# Patient Record
Sex: Female | Born: 1935
Health system: Southern US, Community
[De-identification: ages and names within clinical notes are randomized; demographics above are authoritative.]

## PROBLEM LIST (undated history)

## (undated) DIAGNOSIS — Z87442 Personal history of urinary calculi: Secondary | ICD-10-CM

## (undated) DIAGNOSIS — H3530 Unspecified macular degeneration: Secondary | ICD-10-CM

## (undated) DIAGNOSIS — E039 Hypothyroidism, unspecified: Secondary | ICD-10-CM

## (undated) DIAGNOSIS — E785 Hyperlipidemia, unspecified: Secondary | ICD-10-CM

## (undated) DIAGNOSIS — K219 Gastro-esophageal reflux disease without esophagitis: Secondary | ICD-10-CM

## (undated) DIAGNOSIS — I10 Essential (primary) hypertension: Secondary | ICD-10-CM

## (undated) DIAGNOSIS — Z952 Presence of prosthetic heart valve: Secondary | ICD-10-CM

## (undated) DIAGNOSIS — I350 Nonrheumatic aortic (valve) stenosis: Secondary | ICD-10-CM

## (undated) DIAGNOSIS — H9319 Tinnitus, unspecified ear: Secondary | ICD-10-CM

## (undated) DIAGNOSIS — H269 Unspecified cataract: Secondary | ICD-10-CM

## (undated) DIAGNOSIS — I4891 Unspecified atrial fibrillation: Secondary | ICD-10-CM

## (undated) DIAGNOSIS — C50919 Malignant neoplasm of unspecified site of unspecified female breast: Secondary | ICD-10-CM

## (undated) DIAGNOSIS — M1990 Unspecified osteoarthritis, unspecified site: Secondary | ICD-10-CM

## (undated) DIAGNOSIS — N393 Stress incontinence (female) (male): Secondary | ICD-10-CM

## (undated) DIAGNOSIS — E079 Disorder of thyroid, unspecified: Secondary | ICD-10-CM

## (undated) HISTORY — DX: Unspecified osteoarthritis, unspecified site: M19.90

## (undated) HISTORY — DX: Unspecified cataract: H26.9

## (undated) HISTORY — PX: ABDOMINAL HYSTERECTOMY: SHX81

## (undated) HISTORY — DX: Gastro-esophageal reflux disease without esophagitis: K21.9

## (undated) HISTORY — DX: Malignant neoplasm of unspecified site of unspecified female breast: C50.919

## (undated) HISTORY — DX: Hyperlipidemia, unspecified: E78.5

## (undated) HISTORY — PX: BACK SURGERY: SHX140

## (undated) HISTORY — DX: Disorder of thyroid, unspecified: E07.9

## (undated) HISTORY — PX: TONSILLECTOMY: SUR1361

## (undated) HISTORY — DX: Essential (primary) hypertension: I10

## (undated) HISTORY — PX: LITHOTRIPSY: SUR834

## (undated) HISTORY — PX: EYE SURGERY: SHX253

## (undated) HISTORY — PX: OTHER SURGICAL HISTORY: SHX169

## (undated) HISTORY — PX: CARDIAC CATHETERIZATION: SHX172

---

## 1973-10-23 HISTORY — PX: THYROIDECTOMY, PARTIAL: SHX18

## 1998-12-13 ENCOUNTER — Ambulatory Visit (HOSPITAL_COMMUNITY): Admission: RE | Admit: 1998-12-13 | Discharge: 1998-12-13 | Payer: Self-pay | Admitting: Obstetrics and Gynecology

## 1998-12-13 ENCOUNTER — Encounter: Payer: Self-pay | Admitting: Obstetrics and Gynecology

## 1998-12-16 ENCOUNTER — Ambulatory Visit (HOSPITAL_COMMUNITY): Admission: RE | Admit: 1998-12-16 | Discharge: 1998-12-16 | Payer: Self-pay | Admitting: Obstetrics and Gynecology

## 1998-12-16 ENCOUNTER — Encounter: Payer: Self-pay | Admitting: Obstetrics and Gynecology

## 1998-12-22 HISTORY — PX: BREAST LUMPECTOMY: SHX2

## 1999-01-03 ENCOUNTER — Other Ambulatory Visit: Admission: RE | Admit: 1999-01-03 | Discharge: 1999-01-03 | Payer: Self-pay | Admitting: *Deleted

## 1999-01-03 ENCOUNTER — Ambulatory Visit (HOSPITAL_COMMUNITY): Admission: RE | Admit: 1999-01-03 | Discharge: 1999-01-03 | Payer: Self-pay | Admitting: *Deleted

## 1999-01-03 ENCOUNTER — Encounter: Payer: Self-pay | Admitting: *Deleted

## 1999-01-12 ENCOUNTER — Ambulatory Visit (HOSPITAL_BASED_OUTPATIENT_CLINIC_OR_DEPARTMENT_OTHER): Admission: RE | Admit: 1999-01-12 | Discharge: 1999-01-12 | Payer: Self-pay | Admitting: *Deleted

## 1999-01-12 ENCOUNTER — Encounter: Payer: Self-pay | Admitting: *Deleted

## 1999-02-09 ENCOUNTER — Encounter: Admission: RE | Admit: 1999-02-09 | Discharge: 1999-05-10 | Payer: Self-pay | Admitting: Radiation Oncology

## 1999-05-19 ENCOUNTER — Other Ambulatory Visit: Admission: RE | Admit: 1999-05-19 | Discharge: 1999-05-19 | Payer: Self-pay | Admitting: Obstetrics and Gynecology

## 1999-06-08 ENCOUNTER — Ambulatory Visit (HOSPITAL_COMMUNITY): Admission: RE | Admit: 1999-06-08 | Discharge: 1999-06-08 | Payer: Self-pay | Admitting: Internal Medicine

## 1999-06-08 ENCOUNTER — Encounter: Payer: Self-pay | Admitting: Internal Medicine

## 1999-06-20 ENCOUNTER — Ambulatory Visit (HOSPITAL_COMMUNITY): Admission: RE | Admit: 1999-06-20 | Discharge: 1999-06-20 | Payer: Self-pay | Admitting: *Deleted

## 1999-07-29 ENCOUNTER — Ambulatory Visit (HOSPITAL_COMMUNITY): Admission: RE | Admit: 1999-07-29 | Discharge: 1999-07-29 | Payer: Self-pay | Admitting: *Deleted

## 1999-07-29 ENCOUNTER — Encounter: Payer: Self-pay | Admitting: *Deleted

## 1999-12-21 ENCOUNTER — Encounter: Payer: Self-pay | Admitting: *Deleted

## 1999-12-21 ENCOUNTER — Ambulatory Visit (HOSPITAL_COMMUNITY): Admission: RE | Admit: 1999-12-21 | Discharge: 1999-12-21 | Payer: Self-pay | Admitting: *Deleted

## 2000-02-04 ENCOUNTER — Encounter: Payer: Self-pay | Admitting: Emergency Medicine

## 2000-02-04 ENCOUNTER — Emergency Department (HOSPITAL_COMMUNITY): Admission: EM | Admit: 2000-02-04 | Discharge: 2000-02-04 | Payer: Self-pay | Admitting: Emergency Medicine

## 2000-05-25 ENCOUNTER — Other Ambulatory Visit: Admission: RE | Admit: 2000-05-25 | Discharge: 2000-05-25 | Payer: Self-pay | Admitting: Obstetrics and Gynecology

## 2000-06-22 ENCOUNTER — Encounter: Payer: Self-pay | Admitting: Internal Medicine

## 2000-06-22 ENCOUNTER — Encounter: Admission: RE | Admit: 2000-06-22 | Discharge: 2000-06-22 | Payer: Self-pay | Admitting: Internal Medicine

## 2001-01-01 ENCOUNTER — Ambulatory Visit (HOSPITAL_COMMUNITY): Admission: RE | Admit: 2001-01-01 | Discharge: 2001-01-01 | Payer: Self-pay | Admitting: *Deleted

## 2001-01-01 ENCOUNTER — Encounter: Payer: Self-pay | Admitting: *Deleted

## 2001-01-02 ENCOUNTER — Encounter: Admission: RE | Admit: 2001-01-02 | Discharge: 2001-01-02 | Payer: Self-pay | Admitting: *Deleted

## 2001-01-02 ENCOUNTER — Encounter: Payer: Self-pay | Admitting: *Deleted

## 2001-08-22 ENCOUNTER — Other Ambulatory Visit: Admission: RE | Admit: 2001-08-22 | Discharge: 2001-08-22 | Payer: Self-pay | Admitting: Obstetrics and Gynecology

## 2001-12-17 ENCOUNTER — Encounter: Payer: Self-pay | Admitting: Internal Medicine

## 2001-12-17 ENCOUNTER — Encounter: Admission: RE | Admit: 2001-12-17 | Discharge: 2001-12-17 | Payer: Self-pay | Admitting: Internal Medicine

## 2002-03-20 ENCOUNTER — Other Ambulatory Visit: Admission: RE | Admit: 2002-03-20 | Discharge: 2002-03-20 | Payer: Self-pay | Admitting: Oncology

## 2002-12-26 ENCOUNTER — Encounter: Admission: RE | Admit: 2002-12-26 | Discharge: 2002-12-26 | Payer: Self-pay | Admitting: Internal Medicine

## 2002-12-26 ENCOUNTER — Encounter: Payer: Self-pay | Admitting: Internal Medicine

## 2004-02-05 ENCOUNTER — Encounter: Admission: RE | Admit: 2004-02-05 | Discharge: 2004-02-05 | Payer: Self-pay | Admitting: Internal Medicine

## 2005-02-13 ENCOUNTER — Encounter: Admission: RE | Admit: 2005-02-13 | Discharge: 2005-02-13 | Payer: Self-pay | Admitting: Internal Medicine

## 2005-07-10 ENCOUNTER — Ambulatory Visit (HOSPITAL_COMMUNITY): Admission: RE | Admit: 2005-07-10 | Discharge: 2005-07-10 | Payer: Self-pay | Admitting: *Deleted

## 2005-07-10 ENCOUNTER — Encounter (INDEPENDENT_AMBULATORY_CARE_PROVIDER_SITE_OTHER): Payer: Self-pay | Admitting: Specialist

## 2006-02-16 ENCOUNTER — Encounter: Admission: RE | Admit: 2006-02-16 | Discharge: 2006-02-16 | Payer: Self-pay | Admitting: Surgery

## 2006-07-31 ENCOUNTER — Encounter: Admission: RE | Admit: 2006-07-31 | Discharge: 2006-07-31 | Payer: Self-pay | Admitting: Internal Medicine

## 2006-08-07 ENCOUNTER — Encounter: Admission: RE | Admit: 2006-08-07 | Discharge: 2006-08-07 | Payer: Self-pay | Admitting: Internal Medicine

## 2006-08-08 ENCOUNTER — Encounter: Admission: RE | Admit: 2006-08-08 | Discharge: 2006-08-08 | Payer: Self-pay | Admitting: Internal Medicine

## 2007-03-07 ENCOUNTER — Encounter: Admission: RE | Admit: 2007-03-07 | Discharge: 2007-03-07 | Payer: Self-pay | Admitting: Internal Medicine

## 2008-03-20 ENCOUNTER — Encounter: Admission: RE | Admit: 2008-03-20 | Discharge: 2008-03-20 | Payer: Self-pay | Admitting: Internal Medicine

## 2009-03-29 ENCOUNTER — Encounter: Admission: RE | Admit: 2009-03-29 | Discharge: 2009-03-29 | Payer: Self-pay | Admitting: Internal Medicine

## 2010-07-15 ENCOUNTER — Encounter: Admission: RE | Admit: 2010-07-15 | Discharge: 2010-07-15 | Payer: Self-pay | Admitting: Surgery

## 2010-07-22 ENCOUNTER — Encounter: Admission: RE | Admit: 2010-07-22 | Discharge: 2010-07-22 | Payer: Self-pay | Admitting: Surgery

## 2010-11-13 ENCOUNTER — Encounter: Payer: Self-pay | Admitting: Surgery

## 2011-03-10 NOTE — Op Note (Signed)
NAMELORRIN, BODNER           ACCOUNT NO.:  [id number]   MEDICAL RECORD NO.:  [id number]          PATIENT TYPE:  AMB   LOCATION:  ENDO                         FACILITY:  [facility]   PHYSICIAN:  Georgiana Spinner, M.D.    DATE OF BIRTH:  [birth year]   DATE OF PROCEDURE:  07/10/2005  DATE OF DISCHARGE:                                 OPERATIVE REPORT   PROCEDURE:  Upper endoscopy with biopsy.   INDICATIONS:  GERD.   ANESTHESIA:  Demerol 50, Versed 5 mg.   PROCEDURE:  With patient mildly sedated in the left lateral decubitus  position, the Olympus videoscopic endoscope was inserted in the mouth,  passed under direct vision through the esophagus which appeared normal until  we reached the distal esophagus, and there was a question of Barrett's,  photographed and biopsied.  We entered into the stomach.  Fundus, body,  antrum, duodenal bulb, second portion of duodenum all visualized.  From this  point, the endoscope was slowly withdrawn taking circumferential views of  duodenal mucosa until the endoscope had been pulled back into the stomach,  placed in retroflexion to view the stomach from below.  The endoscope was  then straightened and withdrawn taking circumferential views of remaining  gastric and esophageal mucosa.  The patient's vital signs and pulse oximeter  remained stable.  The patient tolerated the procedure well without apparent  complications.   FINDINGS:  Question of Barrett's esophagus, biopsied.  Await biopsy report.  The patient will call me for results and follow-up with me as an outpatient.  Proceed to colonoscopy as planned.           ______________________________  Georgiana Spinner, M.D.     GMO/MEDQ  D:  07/10/2005  T:  07/10/2005  Job:  [id number]

## 2011-03-10 NOTE — Op Note (Signed)
[name], [name]           ACCOUNT NO.:  [id number]   MEDICAL RECORD NO.:  [id number]          PATIENT TYPE:  AMB   LOCATION:  ENDO                         FACILITY:  [facility]   PHYSICIAN:  Georgiana Spinner, M.D.    DATE OF BIRTH:  05/22/1934   DATE OF PROCEDURE:  07/10/2005  DATE OF DISCHARGE:                                 OPERATIVE REPORT   PROCEDURE:  Colonoscopy.   INDICATIONS:  Colon polyps.   ANESTHESIA:  Demerol 20, Versed 2 milligrams.   DESCRIPTION OF PROCEDURE:  With the patient mildly sedated in the left  lateral decubitus position, the Olympus videoscopic colonoscope was inserted  in the rectum, passed under direct vision to the cecum, identified by the  ileocecal valve and appendiceal orifice, both of which were photographed.  From this point, the colonoscope was slowly withdrawn taking circumferential  views of the colonic mucosa stopping only in the rectum which appeared  normal on direct and retroflexed view. The endoscope was straightened and  withdrawn. The patient's vital signs and pulse oximeter remained stable. The  patient tolerated the procedure well without apparent complications.   FINDINGS:  Unremarkable examination.   PLAN:  See endoscopy note.           ______________________________  Georgiana Spinner, M.D.     GMO/MEDQ  D:  07/10/2005  T:  07/10/2005  Job:  [id number]

## 2011-03-16 ENCOUNTER — Encounter (INDEPENDENT_AMBULATORY_CARE_PROVIDER_SITE_OTHER): Payer: Self-pay | Admitting: Surgery

## 2011-03-24 ENCOUNTER — Ambulatory Visit (HOSPITAL_COMMUNITY)
Admission: RE | Admit: 2011-03-24 | Discharge: 2011-03-24 | Disposition: A | Discharge status: Home / Self Care | Payer: Medicare Other | Source: Ambulatory Visit | Attending: Cardiology | Admitting: Cardiology

## 2011-03-24 DIAGNOSIS — E039 Hypothyroidism, unspecified: Secondary | ICD-10-CM | POA: Insufficient documentation

## 2011-03-24 DIAGNOSIS — K219 Gastro-esophageal reflux disease without esophagitis: Secondary | ICD-10-CM | POA: Insufficient documentation

## 2011-03-24 DIAGNOSIS — E785 Hyperlipidemia, unspecified: Secondary | ICD-10-CM | POA: Insufficient documentation

## 2011-03-24 DIAGNOSIS — I10 Essential (primary) hypertension: Secondary | ICD-10-CM | POA: Insufficient documentation

## 2011-03-24 DIAGNOSIS — M19049 Primary osteoarthritis, unspecified hand: Secondary | ICD-10-CM | POA: Insufficient documentation

## 2011-03-24 DIAGNOSIS — Z853 Personal history of malignant neoplasm of breast: Secondary | ICD-10-CM | POA: Insufficient documentation

## 2011-03-24 DIAGNOSIS — R0609 Other forms of dyspnea: Secondary | ICD-10-CM | POA: Insufficient documentation

## 2011-03-24 DIAGNOSIS — R0989 Other specified symptoms and signs involving the circulatory and respiratory systems: Secondary | ICD-10-CM | POA: Insufficient documentation

## 2011-03-24 DIAGNOSIS — E669 Obesity, unspecified: Secondary | ICD-10-CM | POA: Insufficient documentation

## 2011-03-24 DIAGNOSIS — I2510 Atherosclerotic heart disease of native coronary artery without angina pectoris: Secondary | ICD-10-CM | POA: Insufficient documentation

## 2011-04-02 ENCOUNTER — Ambulatory Visit (HOSPITAL_COMMUNITY)
Admission: EM | Admit: 2011-04-02 | Discharge: 2011-04-03 | Disposition: A | Discharge status: Home / Self Care | Payer: Medicare Other | Attending: Cardiology | Admitting: Cardiology

## 2011-04-02 ENCOUNTER — Inpatient Hospital Stay (INDEPENDENT_AMBULATORY_CARE_PROVIDER_SITE_OTHER)
Admission: RE | Admit: 2011-04-02 | Discharge: 2011-04-02 | Disposition: A | Discharge status: Home / Self Care | Payer: Medicare Other | Source: Ambulatory Visit | Attending: Emergency Medicine | Admitting: Emergency Medicine

## 2011-04-02 ENCOUNTER — Emergency Department (HOSPITAL_COMMUNITY): Payer: Medicare Other

## 2011-04-02 DIAGNOSIS — R0989 Other specified symptoms and signs involving the circulatory and respiratory systems: Secondary | ICD-10-CM | POA: Insufficient documentation

## 2011-04-02 DIAGNOSIS — R079 Chest pain, unspecified: Secondary | ICD-10-CM

## 2011-04-02 DIAGNOSIS — Z01812 Encounter for preprocedural laboratory examination: Secondary | ICD-10-CM | POA: Insufficient documentation

## 2011-04-02 DIAGNOSIS — R0609 Other forms of dyspnea: Secondary | ICD-10-CM | POA: Insufficient documentation

## 2011-04-02 DIAGNOSIS — E785 Hyperlipidemia, unspecified: Secondary | ICD-10-CM | POA: Insufficient documentation

## 2011-04-02 DIAGNOSIS — I10 Essential (primary) hypertension: Secondary | ICD-10-CM | POA: Insufficient documentation

## 2011-04-02 DIAGNOSIS — I2510 Atherosclerotic heart disease of native coronary artery without angina pectoris: Secondary | ICD-10-CM | POA: Insufficient documentation

## 2011-04-02 DIAGNOSIS — R0602 Shortness of breath: Secondary | ICD-10-CM | POA: Insufficient documentation

## 2011-04-02 DIAGNOSIS — Z01810 Encounter for preprocedural cardiovascular examination: Secondary | ICD-10-CM | POA: Insufficient documentation

## 2011-04-02 DIAGNOSIS — M79609 Pain in unspecified limb: Secondary | ICD-10-CM

## 2011-04-02 DIAGNOSIS — R0789 Other chest pain: Secondary | ICD-10-CM | POA: Insufficient documentation

## 2011-04-02 DIAGNOSIS — Z01818 Encounter for other preprocedural examination: Secondary | ICD-10-CM | POA: Insufficient documentation

## 2011-04-02 LAB — CBC
Platelets: 191 10*3/uL (ref 150–400)
RDW: 12.7 % (ref 11.5–15.5)
WBC: 8.5 10*3/uL (ref 4.0–10.5)

## 2011-04-02 LAB — D-DIMER, QUANTITATIVE: D-Dimer, Quant: 0.35 ug/mL-FEU (ref 0.00–0.48)

## 2011-04-02 LAB — TROPONIN I: Troponin I: <0.3 ng/mL (ref <0.30)

## 2011-04-02 LAB — POCT I-STAT, CHEM 8
Calcium, Ion: 0.99 mmol/L — ABNORMAL LOW (ref 1.12–1.32)
HCT: 37 % (ref 36.0–46.0)
TCO2: 27 mmol/L (ref 0–100)

## 2011-04-03 LAB — CBC
MCH: 29.6 pg (ref 26.0–34.0)
MCV: 87.3 fL (ref 78.0–100.0)
Platelets: 166 10*3/uL (ref 150–400)
RDW: 12.8 % (ref 11.5–15.5)

## 2011-04-03 LAB — CK TOTAL AND CKMB (NOT AT ARMC): Total CK: 34 U/L (ref 7–177)

## 2011-04-18 NOTE — Cardiovascular Report (Signed)
NAMELUISE, YAMAMOTO           ACCOUNT NO.:  [id number]  MEDICAL RECORD NO.:  [id number]           PATIENT TYPE:  O  LOCATION:  6522                         FACILITY:  [facility]  PHYSICIAN:  Pamella Pert, MD DATE OF BIRTH:  [birth year]  DATE OF PROCEDURE:  03/24/2011 DATE OF DISCHARGE:                           CARDIAC CATHETERIZATION   PROCEDURES PERFORMED: 1. Left ventriculography. 2. Selective right and left coronary arteriography.  INDICATIONS:  [name] is a 77 year old female, who presents with chest pain.  She had undergone treadmill exercise stress test which had revealed ST-segment depression which persisted for greater than 4 minutes into recovery.  Because of abnormal stress test and chest pain which is atypical and shortness of breath on exertion and hypertension and hyperlipidemia, she was brought to the cardiac catheterization lab to evaluate her coronary anatomy.  HEMODYNAMIC DATA:  The left ventricular pressure was 122/7 with an end- diastolic pressure of 16 mmHg.  Aortic pressure was 107/65 with a mean of 85 mmHg.  There was no pressure gradient across the aortic valve.  ANGIOGRAPHIC DATA:  Left ventricle:  Left ventricular systolic function was supranormal with an ejection fraction of 65%-70%.  There was no regional wall motion abnormality, no significant mitral regurgitation.  Right coronary artery:  Right coronary artery is a large-caliber vessel. It is a dominant vessel.  Smooth and normal.  Left main coronary artery:  Left main coronary artery is a large-caliber vessel.  Smooth and normal.  Circumflex coronary artery:  Circumflex coronary artery is a large- caliber vessel.  It gives origin to small obtuse marginal 1, the larger obtuse marginal 2.  It is smooth and normal.  Ramus intermediate:  Ramus intermediate is a very large-caliber vessel. Ostium of the ramus intermediate has a 30% focal stenosis.  It is mildly hazy.  LAD:   LAD is a very large-caliber vessel giving origin to large diagonal 1 and a moderate-sized diagonal 2.  It is smooth and normal.  IMPRESSION:  Single-vessel coronary artery disease involving the ostium of the ramus intermediate constituting about 70% stenoses.  RECOMMENDATIONS:  I would recommend medical therapy only.  It is very close to the left main circumflex LAD bifurcation and ostial involvement.  There is slightly higher risk for potential complications during angioplasty.  Unless she has worsening angina or lifestyle- limiting angina pectoris in spite of aggressive medical therapy, then only we will consider angioplasty.  A total of 60 mL of contrast was utilized for diagnostic angiography.  TECHNIQUE OF PROCEDURE:  Under sterile precautions using a 6-French right radial access, 6-French TIG #4 catheter was advanced into the ascending aorta, then into the left ventricle.  Left ventriculography was performed in the RAO projection.  Catheter pulled into the ascending aorta.  Left main coronary artery selectively engaged and angiography was performed.  Then, the right coronary artery selectively engaged and angiography was performed.  Catheter then pulled out of the body over an exchange length J-wire.  The patient tolerated the procedure well.  No immediate complications noted.  Hemostasis was obtained by applying TR band.     Pamella Pert, MD  JRG/MEDQ  D:  03/24/2011  T:  03/24/2011  Job:  [id number]  cc:   Massie Maroon, MD  Electronically Signed by Yates Decamp MD on 04/18/2011 10:19:01 AM

## 2011-04-18 NOTE — Cardiovascular Report (Signed)
Zoe [name], Zoe [name]           ACCOUNT NO.:  [id number]  MEDICAL RECORD NO.:  [id number]  LOCATION:  2011                         FACILITY:  [facility]  PHYSICIAN:  Pamella Pert, MD DATE OF BIRTH:  1934-02-09  DATE OF PROCEDURE:  04/03/2011 DATE OF DISCHARGE:  04/03/2011                           CARDIAC CATHETERIZATION   PROCEDURE PERFORMED: 1. Coronary arteriography. 2. Intravascular ultrasound interrogation of the ramus intermediate     branch of the circumflex coronary artery.  INDICATIONS:  [name] is a 77 year old female with history of hypertension, hyperlipidemia who had undergone cardiac catheterization on March 24, 2011 and was found to have a ramus intermediate stenoses of 70%.  It was felt that medical therapy was indicated.  Otherwise she had normal coronary arteries.  However, she was admitted through the emergency department with symptoms suggestive of unstable angina.  She is ruled out for myocardial infarction and brought to the cardiac cath lab to reevaluate her coronary anatomy with a intention of proceeding with further evaluation of the ramus intermedius stenoses either by FFR or intravascular ultrasound.  After obtaining informed consent, she was brought to the cardiac catheterization lab to evaluate the same.  Right coronary artery.  Right coronary artery is a very large caliber vessel and a dominant vessel giving origin to large PDA and large PL branch.  It is smooth and normal.  Left main coronary artery.  Left main coronary artery is large caliber vessel.  Smooth and normal.  Circumflex.  Circumflex coronary artery is a large-caliber vessel giving origin to large obtuse marginal 1.  Smooth and normal.  Ramus intermediate.  Ramus intermediate is a large-caliber vessel which is smooth except the ostium has a 60-70% stenoses.  LAD.  LAD is a large caliber vessel giving origin to large diagonal 1 and diagonal 2.  Smooth and  normal.  IVUS DATA:  Intravascular ultrasound interrogation of the ramus intermedius branch revealed the ramus intermediate branch to have absolutely no atherosclerotic changes throughout the entire lumen.  At the ostium there was intramyocardial bridging.  The lumen area went down from 5.3-mm to 3.59-mm during systole.  However, there are no orthostatic changes, still a good lumen was present.  RECOMMENDATIONS:  Based on the angiographic data, evaluation for noncardiac cause of chest pain is indicated.  Although she has ramus intermediate intramyocardial bridging, this should not give her rest pain and also the residual lumen area is fairly large.  Hence, I suspect either GI etiology for her chest pain.  We will be discharging the patient home today with outpatient consultation.  She would like to see Dr. Vida Rigger.  I will try to arrange the same.  I will also inform Dr. Pearson Grippe regarding her situation.  She will be discharged home on no cardiac medications but continued primary prevention.  TECHNIQUE OF THE PROCEDURE:  Under sterile precautions using a 6-French right femoral arterial access, a 6-French JR-4 diagnostic catheter was utilized to engage the right coronary artery and angiography was performed.  The catheter was then pulled out of body over a J-wire.  A XB 3.5 guide catheter was utilized to engage the left main coronary artery.  Using Angiomax for anticoagulation and  Cougar guidewire, I was able to cross through the ramus intermedius branch.  Using Galaxy IVUS catheter, careful pullback was performed.  IVUS data was carefully analyzed.  Manual pullback was also again performed at the site of the stenosis.  The catheter and wire were withdrawn out of the body.  Prior to IVUS, 200 mcg of intracoronary nitroglycerin was administered.  The patient tolerated the procedure well.  Right femoral arteriography was performed through the arterial access sheath and access was  closed with Perclose with excellent hemostasis. The patient tolerated the procedure. well.  No immediate complications.     Pamella Pert, MD     JRG/MEDQ  D:  04/03/2011  T:  04/04/2011  Job:  [id number]  cc:   Massie Maroon, MD Petra Kuba, M.D.  Electronically Signed by Yates Decamp MD on 04/18/2011 10:19:14 AM

## 2011-04-18 NOTE — H&P (Signed)
NAMEEMALEY, APPLIN           ACCOUNT NO.:  [id number]  MEDICAL RECORD NO.:  [id number]  LOCATION:  2011                         FACILITY:  [facility]  PHYSICIAN:  Pamella Pert, MD DATE OF BIRTH:  [birth year]  DATE OF ADMISSION:  04/02/2011 DATE OF DISCHARGE:  04/03/2011                             HISTORY & PHYSICAL   ADMISSION DIAGNOSES: 1. Unstable angina. 2. Coronary artery disease with known ramus intermediate 70% stenosis     by recent cardiac catheterization. 3. Hypertension. 4. Hyperlipidemia. 5. Shortness of breath and dyspnea on exertion.  HISTORY:  Ms. Stauder is a pleasant fairly active 77 year old female who had recent cardiac catheterization on March 24, 2011.  She was found to have essentially normal coronary arteries except for ramus intermediate which had like 60-70% stenosis, in some views appeared to be much type but, however, it was felt that she could be medically treated.  She had been doing well until Saturday night.  She woke up in the middle of night feeling heaviness in the middle of her chest, took 2 sublingual nitroglycerin with partial relief of chest discomfort and went back to sleep.  All day on Sunday she continued to have mild chest discomfort which she states is very mild.  No radiation of chest pain, no associated nausea, vomiting, or diaphoresis.  She was seen at the Urgent Care mostly for leg pain which she states has been hurting in her legs, both legs, that started about a couple of days ago.  It was just heaviness in her legs and she wanted to be ruled out for any kind of DVTs and the Urgent Care because of chest discomfort she was advised to go to the emergency department.  Point-of-care markers and physical examination and EKG was unremarkable in the emergency department but because of chest discomfort which was persistent.  She was admitted to the hospital as unstable angina.  Presently, this morning, she remains stable and  denies any chest pain, shortness of breath, paroxysmal nocturnal dyspnea, and orthopnea.  She also states that her legs are not hurting anymore.  PAST MEDICAL HISTORY: 1. Hypertension. 2. Hyperlipidemia. 3. Diabetes.  SOCIAL HISTORY:  She is married, lives with her husband, does not drink alcohol, and does not smoke tobacco products.  She is fairly active.  FAMILY HISTORY:  There is no history of premature coronary artery disease in her family.  HOME MEDICATIONS: 1. Imdur 60 mg p.o. daily. 2. Synthroid 100 mcg p.o. daily. 3. Singulair 10 mg p.o. daily. 4. Pravachol 20 mg p.o. at bedtime. 5. Omeprazole 20 mg p.o. b.i.d. 6. Nasonex spray daily. 7. Hydrochlorothiazide 25 mg q.a.m. 8. Coreg 3.125 mg p.o. b.i.d. 9. Aspirin 81 mg daily. 10.Amlodipine 5 mg p.o. daily. 11.Allegra 180 mg p.o. daily.  ALLERGIES:  No known drug allergies.  REVIEW OF SYSTEMS:  She denies any bowel or bladder disturbance.  Denies any neurological weakness.  Denies recent weight changes.  No TIA.  No lower extremity edema.  No acute tenderness in her legs.  No trauma to her legs.  Other systems were negative.  She is not a diabetic.  PHYSICAL EXAMINATION:  GENERAL:  She is moderately built and overweight. She appears to  be in no acute distress. VITAL SIGNS:  Temperature of 98.3, pulse is 78, respirations 14, and blood pressure 115/63 mmHg. CARDIAC:  S1-S2 is normal.  No gallop or murmur. CHEST:  Clear. ABDOMEN:  Soft. EXTREMITIES:  No edema.  Full range of movements.  She has mild bruising in her right wrist from prior radial access cardiac catheterization. NEUROLOGIC:  She is intact without any deficits.  IMAGING:  Her EKG demonstrated sinus rhythm with occasional PVCs.  Her D-dimer has been negative.  Her CBC showed mild anemia which is stable and BMP was within normal limits.  CPK and troponin x1 is negative for myocardial injury.  IMPRESSION: 1. Chest pain suspicious for unstable angina.   The patient had to take     several sublingual nitroglycerin over the weekend. 2. Coronary artery disease with ramus intermedius 70% stenosis,     otherwise smooth and normal coronaries by cardiac catheterization     done on March 24, 2011. 3. Hypertension.  RECOMMENDATIONS:  I discussed with the patient regarding proceeding with repeat cardiac catheterization with probable IVUS guided intervention. She understands less than a percent risk of death, stroke, heart attack and urgent need for bypass surgery but not limited to these.  All questions were answered.  She will be taken to the cardiac catheterization lab this morning.     Pamella Pert, MD     JRG/MEDQ  D:  04/03/2011  T:  04/04/2011  Job:  [id number]  cc:   Massie Maroon, MD  Electronically Signed by Yates Decamp MD on 04/18/2011 10:19:10 AM

## 2011-05-16 ENCOUNTER — Other Ambulatory Visit: Payer: Self-pay | Admitting: Gastroenterology

## 2011-05-17 ENCOUNTER — Ambulatory Visit
Admission: RE | Admit: 2011-05-17 | Discharge: 2011-05-17 | Disposition: A | Discharge status: Home / Self Care | Payer: Medicare Other | Source: Ambulatory Visit | Attending: Gastroenterology | Admitting: Gastroenterology

## 2011-08-09 ENCOUNTER — Other Ambulatory Visit (INDEPENDENT_AMBULATORY_CARE_PROVIDER_SITE_OTHER): Payer: Self-pay | Admitting: Surgery

## 2011-08-09 DIAGNOSIS — Z1231 Encounter for screening mammogram for malignant neoplasm of breast: Secondary | ICD-10-CM

## 2011-09-01 ENCOUNTER — Ambulatory Visit
Admission: RE | Admit: 2011-09-01 | Discharge: 2011-09-01 | Disposition: A | Discharge status: Home / Self Care | Payer: Medicare Other | Source: Ambulatory Visit | Attending: Surgery | Admitting: Surgery

## 2011-09-01 DIAGNOSIS — Z1231 Encounter for screening mammogram for malignant neoplasm of breast: Secondary | ICD-10-CM

## 2011-12-13 ENCOUNTER — Encounter (INDEPENDENT_AMBULATORY_CARE_PROVIDER_SITE_OTHER): Payer: Self-pay | Admitting: Surgery

## 2011-12-13 ENCOUNTER — Ambulatory Visit (INDEPENDENT_AMBULATORY_CARE_PROVIDER_SITE_OTHER): Payer: Medicare Other | Admitting: Surgery

## 2011-12-13 VITALS — BP 144/76 | HR 70 | Temp 97.8°F | Resp 18 | Ht 64.0 in | Wt 183.2 lb

## 2011-12-13 DIAGNOSIS — Z853 Personal history of malignant neoplasm of breast: Secondary | ICD-10-CM | POA: Insufficient documentation

## 2011-12-13 NOTE — Progress Notes (Signed)
[facility]  Ovidio Kin, MD,  FACS [address],  [address] [place], [place]    [address] Phone:  [phone] FAX:  [phone]   Re:   Zoe [name] DOB:   07/22/1934 MRN:   [id number]  ASSESSMENT AND PLAN: 1.  Left breast cancer, DCIS  Left lumpectomy - March 2000 by Dr. Elvina Mattes  Saw Dr. Darnelle Catalan post op.  Disease free.  Follow up in one year.  2  History of thyroid surgery for benign disease.  Thyroid function followed by Dr. Selena Batten. 3.  History of HTN. 4.  Following HgbA1c - on no meds.  HISTORY OF PRESENT ILLNESS: Chief Complaint  Patient presents with  . Breast Cancer Long Term Follow Up    Zoe [name] is a 78 y.o. (DOB: 01/29/1934)  white female who is a patient of Pearson Grippe, MD, MD and comes to me today for follow up for left breast cancer.  She is doing well with no new mass or lesion.  Her biggest concern is her blood surgars which have been high.  PHYSICAL EXAM: BP 144/76  Pulse 70  Temp(Src) 97.8 F (36.6 C) (Temporal)  Resp 18  Ht 5\' 4"  (1.626 m)  Wt 183 lb 3.2 oz (83.099 kg)  BMI 31.45 kg/m2  HEENT:  Pupils equal.  Dentition good.  No injury. NECK:  Supple.  No thyroid mass. LYMPH NODES:  No cervical, supraclavicular, or axillary adenopathy. BREASTS -  RIGHT:  No palpable mass or nodule.  No nipple discharge.   LEFT:  No palpable mass or nodule.  No nipple discharge.  Scar at 2 o'clock which is well healed. UPPER EXTREMITIES:  No evidence of lymphedema.  DATA REVIEWED: Mammogram - 09/01/2011 - negative.   Ovidio Kin, MD, FACS Office:  [phone]

## 2012-01-10 ENCOUNTER — Other Ambulatory Visit: Payer: Self-pay | Admitting: Otolaryngology

## 2012-01-16 ENCOUNTER — Other Ambulatory Visit: Payer: Self-pay | Admitting: Otolaryngology

## 2012-01-16 ENCOUNTER — Ambulatory Visit
Admission: RE | Admit: 2012-01-16 | Discharge: 2012-01-16 | Disposition: A | Discharge status: Home / Self Care | Payer: Medicare Other | Source: Ambulatory Visit | Attending: Otolaryngology | Admitting: Otolaryngology

## 2012-01-16 DIAGNOSIS — R05 Cough: Secondary | ICD-10-CM

## 2012-01-16 DIAGNOSIS — R062 Wheezing: Secondary | ICD-10-CM

## 2012-08-23 ENCOUNTER — Other Ambulatory Visit (INDEPENDENT_AMBULATORY_CARE_PROVIDER_SITE_OTHER): Payer: Self-pay | Admitting: Surgery

## 2012-08-23 DIAGNOSIS — Z1231 Encounter for screening mammogram for malignant neoplasm of breast: Secondary | ICD-10-CM

## 2012-08-23 DIAGNOSIS — Z853 Personal history of malignant neoplasm of breast: Secondary | ICD-10-CM

## 2012-09-12 ENCOUNTER — Ambulatory Visit
Admission: RE | Admit: 2012-09-12 | Discharge: 2012-09-12 | Disposition: A | Discharge status: Home / Self Care | Payer: Medicare Other | Source: Ambulatory Visit | Attending: Surgery | Admitting: Surgery

## 2012-09-12 DIAGNOSIS — Z853 Personal history of malignant neoplasm of breast: Secondary | ICD-10-CM

## 2012-09-12 DIAGNOSIS — Z1231 Encounter for screening mammogram for malignant neoplasm of breast: Secondary | ICD-10-CM

## 2012-10-03 ENCOUNTER — Ambulatory Visit
Admission: RE | Admit: 2012-10-03 | Discharge: 2012-10-03 | Disposition: A | Discharge status: Home / Self Care | Payer: Medicare Other | Source: Ambulatory Visit | Attending: Otolaryngology | Admitting: Otolaryngology

## 2012-10-03 ENCOUNTER — Other Ambulatory Visit: Payer: Self-pay | Admitting: Otolaryngology

## 2012-10-03 DIAGNOSIS — J328 Other chronic sinusitis: Secondary | ICD-10-CM

## 2013-01-09 ENCOUNTER — Ambulatory Visit (INDEPENDENT_AMBULATORY_CARE_PROVIDER_SITE_OTHER): Payer: Medicare Other | Admitting: Surgery

## 2013-01-09 ENCOUNTER — Encounter (INDEPENDENT_AMBULATORY_CARE_PROVIDER_SITE_OTHER): Payer: Self-pay | Admitting: Surgery

## 2013-01-09 VITALS — BP 146/72 | HR 92 | Temp 96.7°F | Ht 63.25 in | Wt 186.2 lb

## 2013-01-09 DIAGNOSIS — Z853 Personal history of malignant neoplasm of breast: Secondary | ICD-10-CM

## 2013-01-09 NOTE — Progress Notes (Signed)
[facility]  Ovidio Kin, MD,  FACS [address]  [address] [place], [place]    [address] Phone:  [phone] FAX:  [phone]   Re:   Zoe [name] DOB:   [birth year] MRN:   [id number]  ASSESSMENT AND PLAN: 1.  Left breast cancer, DCIS  Left lumpectomy - March 2000 by Dr. Elvina Mattes  Saw Dr. Darnelle Catalan post op.  Disease free.  Follow up in one year.  2  History of thyroid surgery for benign disease.  Thyroid function followed by Dr. Selena Batten. 3.  History of HTN. 4.  Following HgbA1c - on no meds. 5.  Coughing.   More at night.  Blamed on GERD.  Seeing Dr. Margo Aye, ENT, and Dr. Jeanella Cara for cards.  HISTORY OF PRESENT ILLNESS: Chief Complaint  Patient presents with  . Breast Cancer Long Term Follow Up    reck br   Zoe [name] is a 79 y.o. (DOB: 1934-05-03)  white female who is a patient of KIM, Fayrene Fearing, MD and comes to me today for follow up for left breast cancer.  She is doing well with no new mass or lesion.  We got off on a discussion of GERD and coughing.  Her coughing is worse at night. She saw Dr. Allegra Grana, who thinks her primary problem is gastroesophageal reflux disease. She did have a CT scan of her head on 10/03/2012 which shows chronic sinusitis. She's also seen Dr. Yates Decamp, who increased her omeprazole, increase her Coreg, and increase her Losartan.  She thinks that she feels a little better.  We talked about grandchildren, she has 9.  And we talked about [place].  PHYSICAL EXAM: BP 146/72  Pulse 92  Temp(Src) 96.7 F (35.9 C) (Temporal)  Ht 5' 3.25" (1.607 m)  Wt 186 lb 3.2 oz (84.46 kg)  BMI 32.71 kg/m2  SpO2 97%  HEENT:  Pupils equal.  Dentition good.  No injury. NECK:  Supple.  No thyroid mass. LYMPH NODES:  No cervical, supraclavicular, or axillary adenopathy. BREASTS -  RIGHT:  No palpable mass or nodule.  No nipple discharge.   LEFT:  No palpable mass or nodule.  No nipple discharge.  Scar at 2 o'clock  which is well healed. UPPER EXTREMITIES:  No evidence of lymphedema.  DATA REVIEWED: Mammogram - 09/12/2012 - negative.  Ovidio Kin, MD, FACS Office:  [phone]

## 2013-09-23 ENCOUNTER — Other Ambulatory Visit: Payer: Self-pay

## 2013-09-23 DIAGNOSIS — Z853 Personal history of malignant neoplasm of breast: Secondary | ICD-10-CM

## 2013-09-23 DIAGNOSIS — Z1231 Encounter for screening mammogram for malignant neoplasm of breast: Secondary | ICD-10-CM

## 2013-10-27 ENCOUNTER — Ambulatory Visit
Admission: RE | Admit: 2013-10-27 | Discharge: 2013-10-27 | Disposition: A | Discharge status: Home / Self Care | Payer: Medicare Other | Source: Ambulatory Visit

## 2013-10-27 DIAGNOSIS — Z1231 Encounter for screening mammogram for malignant neoplasm of breast: Secondary | ICD-10-CM

## 2013-10-27 DIAGNOSIS — Z853 Personal history of malignant neoplasm of breast: Secondary | ICD-10-CM

## 2014-03-06 ENCOUNTER — Ambulatory Visit (INDEPENDENT_AMBULATORY_CARE_PROVIDER_SITE_OTHER): Payer: Medicare Other | Admitting: Surgery

## 2014-03-06 ENCOUNTER — Encounter (INDEPENDENT_AMBULATORY_CARE_PROVIDER_SITE_OTHER): Payer: Self-pay | Admitting: Surgery

## 2014-03-06 VITALS — BP 155/75 | HR 74 | Temp 97.6°F | Resp 14 | Ht 63.25 in | Wt 187.0 lb

## 2014-03-06 DIAGNOSIS — Z853 Personal history of malignant neoplasm of breast: Secondary | ICD-10-CM

## 2014-03-06 NOTE — Progress Notes (Signed)
[place], MD,  [address]  [place], [place]    [place] Phone:  [phone] FAX:  [phone]   Re:   Zoe [name] DOB:   03/31/1934 MRN:   [id number]  ASSESSMENT AND PLAN: 1.  Left breast cancer, DCIS  Left lumpectomy - March 2000 by Dr. Walker Kehr  Saw Dr. Jana Hakim post op.  Took tamoxifen x 5 years.  Disease free.   1A.  Left breast pain - immediately inferior to the left areola.  She thinks she feels something, but not all the time.  I spoke to [place] at [facility] - she is going to get the films pushed to Epic.  I printed a copy of the report for Zoe [name].  She had a breast density of "B" -  Though on my exam of the mammograms, I would call them breast density "A"  If she has persistant pain beyond 2 months, she will call back and we will decide whether she needs she needs further imaging.  Because of the breast pain, I have made her follow up 6 months.  2  History of thyroid surgery for benign disease by Dr, Sheilah Pigeon.  Thyroid function followed by Dr. Maudie Mercury. 3.  HTN. 4.  Following HgbA1c - on no meds. 5.  Coughing.  Over all better.  Blamed on GERD.    She has seen Dr. Elie Goody, ENT, and Dr. Christen Butter for cards.  HISTORY OF PRESENT ILLNESS: Chief Complaint  Patient presents with  . nipple pain   Zoe [name] is a 80 y.o. (DOB: [birth year])  white female who is a patient of KIM, Jeneen Rinks, MD and comes to me today for follow up for left breast cancer. She comes by herself. More specifically, she comes for pain just below her left nipple that has been going on for about 2 weeks.  It aches enough to be a 7-8/10.  But at other times, it does not hurt at all. No nipple discharge.  Nothing really makes it better or worse.  REVIEW OF SYMPTOMS: She had 2 heart caths by Dr. Einar Gip in 2013 - the first showed a 20% blocakage, the second showed a 0% blockage.  SOCIAL HISTORY: We talked about grandchildren,  she has 20, Her youngest is about 25 (born 2009).  I think that her oldest is going to college. She lives near [name] and watches his dog when he is out of town.  PHYSICAL EXAM: BP 155/75  Pulse 74  Temp(Src) 97.6 F (36.4 C)  Resp 14  Ht 5' 3.25" (1.607 m)  Wt 187 lb (84.823 kg)  BMI 32.85 kg/m2  HEENT:  Pupils equal.   No injury. NECK:  Supple.  No thyroid mass. LYMPH NODES:  No cervical, supraclavicular, or axillary adenopathy. BREASTS -  RIGHT:  No palpable mass or nodule.  No nipple discharge.   LEFT:  No palpable mass or nodule.  No nipple discharge.  Scar at 2 o'clock which is well healed.  I feel no specific mass or nodule. UPPER EXTREMITIES:  No evidence of lymphedema.  DATA REVIEWED: Mammogram - 10/28/2013 - negative.  Alphonsa Overall, MD, [place] Office:  [phone]

## 2014-03-24 ENCOUNTER — Other Ambulatory Visit: Payer: Self-pay | Admitting: Internal Medicine

## 2014-03-24 DIAGNOSIS — N644 Mastodynia: Secondary | ICD-10-CM

## 2014-04-01 ENCOUNTER — Ambulatory Visit
Admission: RE | Admit: 2014-04-01 | Discharge: 2014-04-01 | Disposition: A | Discharge status: Home / Self Care | Payer: Medicare Other | Source: Ambulatory Visit | Attending: Internal Medicine | Admitting: Internal Medicine

## 2014-04-01 DIAGNOSIS — N644 Mastodynia: Secondary | ICD-10-CM

## 2014-11-27 DIAGNOSIS — J069 Acute upper respiratory infection, unspecified: Secondary | ICD-10-CM | POA: Diagnosis not present

## 2014-12-17 DIAGNOSIS — Z853 Personal history of malignant neoplasm of breast: Secondary | ICD-10-CM | POA: Diagnosis not present

## 2014-12-17 DIAGNOSIS — N644 Mastodynia: Secondary | ICD-10-CM | POA: Diagnosis not present

## 2015-01-01 ENCOUNTER — Other Ambulatory Visit: Payer: Self-pay

## 2015-01-01 DIAGNOSIS — Z1231 Encounter for screening mammogram for malignant neoplasm of breast: Secondary | ICD-10-CM

## 2015-01-21 ENCOUNTER — Ambulatory Visit
Admission: RE | Admit: 2015-01-21 | Discharge: 2015-01-21 | Disposition: A | Discharge status: Home / Self Care | Payer: Medicare Other | Source: Ambulatory Visit

## 2015-01-21 DIAGNOSIS — Z1231 Encounter for screening mammogram for malignant neoplasm of breast: Secondary | ICD-10-CM

## 2015-02-17 DIAGNOSIS — M1711 Unilateral primary osteoarthritis, right knee: Secondary | ICD-10-CM | POA: Diagnosis not present

## 2015-02-17 DIAGNOSIS — M25562 Pain in left knee: Secondary | ICD-10-CM | POA: Diagnosis not present

## 2015-02-17 DIAGNOSIS — M25561 Pain in right knee: Secondary | ICD-10-CM | POA: Diagnosis not present

## 2015-03-12 DIAGNOSIS — R21 Rash and other nonspecific skin eruption: Secondary | ICD-10-CM | POA: Diagnosis not present

## 2015-04-02 DIAGNOSIS — I10 Essential (primary) hypertension: Secondary | ICD-10-CM | POA: Diagnosis not present

## 2015-04-02 DIAGNOSIS — E559 Vitamin D deficiency, unspecified: Secondary | ICD-10-CM | POA: Diagnosis not present

## 2015-04-02 DIAGNOSIS — E039 Hypothyroidism, unspecified: Secondary | ICD-10-CM | POA: Diagnosis not present

## 2015-04-06 DIAGNOSIS — I10 Essential (primary) hypertension: Secondary | ICD-10-CM | POA: Diagnosis not present

## 2015-04-06 DIAGNOSIS — Z0000 Encounter for general adult medical examination without abnormal findings: Secondary | ICD-10-CM | POA: Diagnosis not present

## 2015-04-06 DIAGNOSIS — R011 Cardiac murmur, unspecified: Secondary | ICD-10-CM | POA: Diagnosis not present

## 2015-04-06 DIAGNOSIS — R05 Cough: Secondary | ICD-10-CM | POA: Diagnosis not present

## 2015-04-07 DIAGNOSIS — Z1210 Encounter for screening for malignant neoplasm of intestinal tract, unspecified: Secondary | ICD-10-CM | POA: Diagnosis not present

## 2015-04-07 DIAGNOSIS — K210 Gastro-esophageal reflux disease with esophagitis: Secondary | ICD-10-CM | POA: Diagnosis not present

## 2015-04-29 DIAGNOSIS — I358 Other nonrheumatic aortic valve disorders: Secondary | ICD-10-CM | POA: Diagnosis not present

## 2015-04-29 DIAGNOSIS — I201 Angina pectoris with documented spasm: Secondary | ICD-10-CM | POA: Diagnosis not present

## 2015-04-29 DIAGNOSIS — R0989 Other specified symptoms and signs involving the circulatory and respiratory systems: Secondary | ICD-10-CM | POA: Diagnosis not present

## 2015-04-29 DIAGNOSIS — I10 Essential (primary) hypertension: Secondary | ICD-10-CM | POA: Diagnosis not present

## 2015-05-05 DIAGNOSIS — I10 Essential (primary) hypertension: Secondary | ICD-10-CM | POA: Diagnosis not present

## 2015-05-05 DIAGNOSIS — R011 Cardiac murmur, unspecified: Secondary | ICD-10-CM | POA: Diagnosis not present

## 2015-05-19 DIAGNOSIS — M25562 Pain in left knee: Secondary | ICD-10-CM | POA: Diagnosis not present

## 2015-05-19 DIAGNOSIS — M25561 Pain in right knee: Secondary | ICD-10-CM | POA: Diagnosis not present

## 2015-05-19 DIAGNOSIS — M1711 Unilateral primary osteoarthritis, right knee: Secondary | ICD-10-CM | POA: Diagnosis not present

## 2015-05-27 DIAGNOSIS — R0989 Other specified symptoms and signs involving the circulatory and respiratory systems: Secondary | ICD-10-CM | POA: Diagnosis not present

## 2015-06-09 DIAGNOSIS — E780 Pure hypercholesterolemia: Secondary | ICD-10-CM | POA: Diagnosis not present

## 2015-06-09 DIAGNOSIS — I201 Angina pectoris with documented spasm: Secondary | ICD-10-CM | POA: Diagnosis not present

## 2015-06-09 DIAGNOSIS — R0989 Other specified symptoms and signs involving the circulatory and respiratory systems: Secondary | ICD-10-CM | POA: Diagnosis not present

## 2015-06-09 DIAGNOSIS — I358 Other nonrheumatic aortic valve disorders: Secondary | ICD-10-CM | POA: Diagnosis not present

## 2015-09-23 DIAGNOSIS — E559 Vitamin D deficiency, unspecified: Secondary | ICD-10-CM | POA: Diagnosis not present

## 2015-09-23 DIAGNOSIS — I10 Essential (primary) hypertension: Secondary | ICD-10-CM | POA: Diagnosis not present

## 2015-09-23 DIAGNOSIS — E039 Hypothyroidism, unspecified: Secondary | ICD-10-CM | POA: Diagnosis not present

## 2015-09-28 DIAGNOSIS — E7800 Pure hypercholesterolemia, unspecified: Secondary | ICD-10-CM | POA: Diagnosis not present

## 2015-09-28 DIAGNOSIS — I10 Essential (primary) hypertension: Secondary | ICD-10-CM | POA: Diagnosis not present

## 2015-09-28 DIAGNOSIS — E039 Hypothyroidism, unspecified: Secondary | ICD-10-CM | POA: Diagnosis not present

## 2015-09-28 DIAGNOSIS — E559 Vitamin D deficiency, unspecified: Secondary | ICD-10-CM | POA: Diagnosis not present

## 2015-09-28 DIAGNOSIS — Z23 Encounter for immunization: Secondary | ICD-10-CM | POA: Diagnosis not present

## 2015-10-08 DIAGNOSIS — J45909 Unspecified asthma, uncomplicated: Secondary | ICD-10-CM | POA: Diagnosis not present

## 2016-01-04 ENCOUNTER — Other Ambulatory Visit: Payer: Self-pay

## 2016-01-04 DIAGNOSIS — Z1231 Encounter for screening mammogram for malignant neoplasm of breast: Secondary | ICD-10-CM

## 2016-01-05 DIAGNOSIS — Z853 Personal history of malignant neoplasm of breast: Secondary | ICD-10-CM | POA: Diagnosis not present

## 2016-01-25 ENCOUNTER — Ambulatory Visit
Admission: RE | Admit: 2016-01-25 | Discharge: 2016-01-25 | Disposition: A | Discharge status: Home / Self Care | Payer: Medicare Other | Source: Ambulatory Visit

## 2016-01-25 DIAGNOSIS — Z1231 Encounter for screening mammogram for malignant neoplasm of breast: Secondary | ICD-10-CM | POA: Diagnosis not present

## 2016-01-26 DIAGNOSIS — M1711 Unilateral primary osteoarthritis, right knee: Secondary | ICD-10-CM | POA: Diagnosis not present

## 2016-01-26 DIAGNOSIS — M170 Bilateral primary osteoarthritis of knee: Secondary | ICD-10-CM | POA: Diagnosis not present

## 2016-01-26 DIAGNOSIS — M1712 Unilateral primary osteoarthritis, left knee: Secondary | ICD-10-CM | POA: Diagnosis not present

## 2016-01-26 DIAGNOSIS — M25562 Pain in left knee: Secondary | ICD-10-CM | POA: Diagnosis not present

## 2016-01-26 DIAGNOSIS — M25561 Pain in right knee: Secondary | ICD-10-CM | POA: Diagnosis not present

## 2016-02-16 DIAGNOSIS — Z961 Presence of intraocular lens: Secondary | ICD-10-CM | POA: Diagnosis not present

## 2016-02-23 DIAGNOSIS — R7309 Other abnormal glucose: Secondary | ICD-10-CM | POA: Diagnosis not present

## 2016-02-23 DIAGNOSIS — Z0000 Encounter for general adult medical examination without abnormal findings: Secondary | ICD-10-CM | POA: Diagnosis not present

## 2016-02-23 DIAGNOSIS — M25569 Pain in unspecified knee: Secondary | ICD-10-CM | POA: Diagnosis not present

## 2016-02-27 NOTE — H&P (Signed)
TOTAL KNEE ADMISSION H&P  Patient is being admitted for left total knee arthroplasty.  Subjective:  Chief Complaint:    Left knee primary OA / pain  HPI: [name], 82 y.o. female, has a history of pain and functional disability in the left knee due to arthritis and has failed non-surgical conservative treatments for greater than 12 weeks to includeNSAID's and/or analgesics, corticosteriod injections, use of assistive devices and activity modification.  Onset of symptoms was gradual, starting 1+ years ago with gradually worsening course since that time. The patient noted no past surgery on the left knee(s).  Patient currently rates pain in the left knee(s) at 10 out of 10 with activity. Patient has worsening of pain with activity and weight bearing, pain that interferes with activities of daily living, pain with passive range of motion, crepitus and joint swelling.  Patient has evidence of periarticular osteophytes and joint space narrowing by imaging studies.  There is no active infection.   Risks, benefits and expectations were discussed with the patient.  Risks including but not limited to the risk of anesthesia, blood clots, nerve damage, blood vessel damage, failure of the prosthesis, infection and up to and including death.  Patient understand the risks, benefits and expectations and wishes to proceed with surgery.    PCP: Jani Gravel, MD  D/C Plans:      SNF - [facility]  Post-op Meds:       No Rx given  Tranexamic Acid:      To be given - IV   Decadron:      Is to be given  FYI:     ASA  Norco    Patient Active Problem List   Diagnosis Date Noted  . History of breast cancer, DCIS, lumpectomy March 2000 12/13/2011   Past Medical History  Diagnosis Date  . Cancer   . Thyroid disease   . Arthritis     some - per patient  . GERD (gastroesophageal reflux disease)   . Hypertension   . Nasal congestion   . Cataract     Past Surgical History  Procedure Laterality  Date  . Abdominal hysterectomy  1970's  . Breast lumpectomy  12/1998    lumpectomy  . Tonsillectomy      as a child - patient not sure of exact date  . Thyroidectomy, partial  1975    No prescriptions prior to admission   Allergies  Allergen Reactions  . Penicillins Other (See Comments)    Patient does not remember reaction. Happened many years ago. Has patient had a PCN reaction causing immediate rash, facial/tongue/throat swelling, SOB or lightheadedness with hypotension: no Has patient had a PCN reaction causing severe rash involving mucus membranes or skin necrosis: no Has patient had a PCN reaction that required hospitalization no Has patient had a PCN reaction occurring within the last 10 years: no If all of the above answers are "NO", then may proceed with Cephalosporin use.   Marland Kitchen Sulfur Other (See Comments)    Alters vision    Social History  Substance Use Topics  . Smoking status: Never Smoker   . Smokeless tobacco: Never Used  . Alcohol Use: No    Family History  Problem Relation Age of Onset  . Diabetes Mother   . Stroke Mother   . Heart disease Father      Review of Systems  Constitutional: Negative.   HENT: Positive for tinnitus.   Eyes: Negative.   Respiratory: Positive for shortness  of breath (with exertion).   Cardiovascular: Negative.   Gastrointestinal: Positive for heartburn.  Genitourinary: Positive for frequency.  Musculoskeletal: Positive for joint pain.  Skin: Negative.   Neurological: Positive for headaches.  Endo/Heme/Allergies: Negative.   Psychiatric/Behavioral: Negative.     Objective:  Physical Exam  Constitutional: She is oriented to person, place, and time. She appears well-developed.  HENT:  Head: Normocephalic.  Eyes: Pupils are equal, round, and reactive to light.  Neck: Neck supple. No JVD present. No tracheal deviation present. No thyromegaly present.  Cardiovascular: Normal rate, regular rhythm, normal heart sounds and  intact distal pulses.   Respiratory: Effort normal and breath sounds normal. No stridor. No respiratory distress. She has no wheezes.  GI: Soft. There is no tenderness. There is no guarding.  Musculoskeletal:       Left knee: She exhibits decreased range of motion, swelling and bony tenderness. She exhibits no ecchymosis, no deformity, no laceration and no erythema. Tenderness found.  Lymphadenopathy:    She has no cervical adenopathy.  Neurological: She is alert and oriented to person, place, and time.  Skin: Skin is warm and dry.  Psychiatric: She has a normal mood and affect.      Labs:  Estimated body mass index is 32.85 kg/(m^2) as calculated from the following:   Height as of 03/06/14: 5' 3.25" (1.607 m).   Weight as of 03/06/14: 84.823 kg (187 lb).   Imaging Review Plain radiographs demonstrate severe degenerative joint disease of the left knee(s).  The bone quality appears to be good for age and reported activity level.  Assessment/Plan:  End stage arthritis, left knee   The patient history, physical examination, clinical judgment of the provider and imaging studies are consistent with end stage degenerative joint disease of the left knee(s) and total knee arthroplasty is deemed medically necessary. The treatment options including medical management, injection therapy arthroscopy and arthroplasty were discussed at length. The risks and benefits of total knee arthroplasty were presented and reviewed. The risks due to aseptic loosening, infection, stiffness, patella tracking problems, thromboembolic complications and other imponderables were discussed. The patient acknowledged the explanation, agreed to proceed with the plan and consent was signed. Patient is being admitted for inpatient treatment for surgery, pain control, PT, OT, prophylactic antibiotics, VTE prophylaxis, progressive ambulation and ADL's and discharge planning. The patient is planning to be discharged to skilled  nursing facility.    [place] Pugh Tykwon Fera   PA-C  02/27/2016, 10:35 PM

## 2016-03-03 ENCOUNTER — Encounter (HOSPITAL_COMMUNITY)
Admission: RE | Admit: 2016-03-03 | Discharge: 2016-03-03 | Disposition: A | Discharge status: Home / Self Care | Payer: Medicare Other | Source: Ambulatory Visit | Attending: Orthopedic Surgery | Admitting: Orthopedic Surgery

## 2016-03-03 ENCOUNTER — Encounter (HOSPITAL_COMMUNITY): Payer: Self-pay

## 2016-03-03 DIAGNOSIS — Z01812 Encounter for preprocedural laboratory examination: Secondary | ICD-10-CM | POA: Diagnosis not present

## 2016-03-03 HISTORY — DX: Hypothyroidism, unspecified: E03.9

## 2016-03-03 HISTORY — DX: Tinnitus, unspecified ear: H93.19

## 2016-03-03 HISTORY — DX: Stress incontinence (female) (male): N39.3

## 2016-03-03 HISTORY — DX: Personal history of urinary calculi: Z87.442

## 2016-03-03 LAB — CBC
HEMATOCRIT: 38.7 % (ref 36.0–46.0)
Hemoglobin: 12.9 g/dL (ref 12.0–15.0)
MCH: 27.9 pg (ref 26.0–34.0)
MCHC: 33.3 g/dL (ref 30.0–36.0)
MCV: 83.6 fL (ref 78.0–100.0)
PLATELETS: 242 10*3/uL (ref 150–400)
RBC: 4.63 MIL/uL (ref 3.87–5.11)
RDW: 14.6 % (ref 11.5–15.5)
WBC: 6.1 10*3/uL (ref 4.0–10.5)

## 2016-03-03 LAB — BASIC METABOLIC PANEL
Anion gap: 7 (ref 5–15)
BUN: 19 mg/dL (ref 6–20)
CHLORIDE: 100 mmol/L — AB (ref 101–111)
CO2: 26 mmol/L (ref 22–32)
CREATININE: 0.69 mg/dL (ref 0.44–1.00)
Calcium: 8.9 mg/dL (ref 8.9–10.3)
GFR calc Af Amer: >60 mL/min (ref >60)
GFR calc non Af Amer: >60 mL/min (ref >60)
GLUCOSE: 105 mg/dL — AB (ref 65–99)
POTASSIUM: 4.2 mmol/L (ref 3.5–5.1)
Sodium: 133 mmol/L — ABNORMAL LOW (ref 135–145)

## 2016-03-03 LAB — SURGICAL PCR SCREEN
MRSA, PCR: NEGATIVE
STAPHYLOCOCCUS AUREUS: NEGATIVE

## 2016-03-03 LAB — PROTIME-INR
INR: 1.1 (ref 0.00–1.49)
Prothrombin Time: 14.4 seconds (ref 11.6–15.2)

## 2016-03-03 LAB — ABO/RH: ABO/RH(D): O POS

## 2016-03-03 NOTE — Progress Notes (Addendum)
Clearance note per Dr Einar Gip / cardiology on chart 02/19/2016 Carotid study report per chart 05/27/2015 EKG per chart 04/29/2015 OV note per chart per Dr Einar Gip 06/09/2015

## 2016-03-03 NOTE — Patient Instructions (Signed)
[name]  03/03/2016   Your procedure is scheduled on: Monday Mar 13, 2016   Report to [facility]  Entrance take [place]  elevators to 3rd floor to  [place] at 11:00 AM.  Call this number if you have problems the morning of surgery [phone]   Remember: ONLY 1 PERSON MAY GO WITH YOU TO SHORT STAY TO GET  READY MORNING OF [place].  Do not eat food After Midnight but may take clear liquids till 7:00 am day of surgery then nothing by mouth.      Take these medicines the morning of surgery with A SIP OF WATER: Carvedilol (Coreg); Levothyroxine; Omeprazole (Prilosec)                               You may not have any metal on your body including hair pins and              piercings  Do not wear jewelry, make-up, lotions, powders or perfumes, deodorant             Do not wear nail polish.  Do not shave  48 hours prior to surgery.              Men may shave face and neck.   Do not bring valuables to the hospital. [place].  Contacts, dentures or bridgework may not be worn into surgery.                Please read over the following fact sheets you were given:MRSA INFORMATION SHEET; INCENTIVE SPIROMETER; BLOOD TRANSFUSION INFORMATION SHEET  _____________________________________________________________________             [facility] - Preparing for Surgery Before surgery, you can play an important role.  Because skin is not sterile, your skin needs to be as free of germs as possible.  You can reduce the number of germs on your skin by washing with CHG (chlorahexidine gluconate) soap before surgery.  CHG is an antiseptic cleaner which kills germs and bonds with the skin to continue killing germs even after washing. Please DO NOT use if you have an allergy to CHG or antibacterial soaps.  If your skin becomes reddened/irritated stop using the CHG and inform your nurse when you arrive at Short  Stay. Do not shave (including legs and underarms) for at least 48 hours prior to the first CHG shower.  You may shave your face/neck. Please follow these instructions carefully:  1.  Shower with CHG Soap the night before surgery and the  morning of Surgery.  2.  If you choose to wash your hair, wash your hair first as usual with your  normal  shampoo.  3.  After you shampoo, rinse your hair and body thoroughly to remove the  shampoo.                           4.  Use CHG as you would any other liquid soap.  You can apply chg directly  to the skin and wash                       Gently with  a scrungie or clean washcloth.  5.  Apply the CHG Soap to your body ONLY FROM THE NECK DOWN.   Do not use on face/ open                           Wound or open sores. Avoid contact with eyes, ears mouth and genitals (private parts).                       Wash face,  Genitals (private parts) with your normal soap.             6.  Wash thoroughly, paying special attention to the area where your surgery  will be performed.  7.  Thoroughly rinse your body with warm water from the neck down.  8.  DO NOT shower/wash with your normal soap after using and rinsing off  the CHG Soap.                9.  Pat yourself dry with a clean towel.            10.  Wear clean pajamas.            11.  Place clean sheets on your bed the night of your first shower and do not  sleep with pets. Day of Surgery : Do not apply any lotions/deodorants the morning of surgery.  Please wear clean clothes to the hospital/surgery center.  FAILURE TO FOLLOW THESE INSTRUCTIONS MAY RESULT IN THE CANCELLATION OF YOUR SURGERY PATIENT SIGNATURE_________________________________  NURSE SIGNATURE__________________________________  ________________________________________________________________________   Zoe [name]  An incentive spirometer is a tool that can help keep your lungs clear and active. This tool measures how well you are  filling your lungs with each breath. Taking long deep breaths may help reverse or decrease the chance of developing breathing (pulmonary) problems (especially infection) following:  A long period of time when you are unable to move or be active. BEFORE THE PROCEDURE   If the spirometer includes an indicator to show your best effort, your nurse or respiratory therapist will set it to a desired goal.  If possible, sit up straight or lean slightly forward. Try not to slouch.  Hold the incentive spirometer in an upright position. INSTRUCTIONS FOR USE   Sit on the edge of your bed if possible, or sit up as far as you can in bed or on a chair.  Hold the incentive spirometer in an upright position.  Breathe out normally.  Place the mouthpiece in your mouth and seal your lips tightly around it.  Breathe in slowly and as deeply as possible, raising the piston or the ball toward the top of the column.  Hold your breath for 3-5 seconds or for as long as possible. Allow the piston or ball to fall to the bottom of the column.  Remove the mouthpiece from your mouth and breathe out normally.  Rest for a few seconds and repeat Steps 1 through 7 at least 10 times every 1-2 hours when you are awake. Take your time and take a few normal breaths between deep breaths.  The spirometer may include an indicator to show your best effort. Use the indicator as a goal to work toward during each repetition.  After each set of 10 deep breaths, practice coughing to be sure your lungs are clear. If you have an incision (the cut made at the time of surgery), support your  incision when coughing by placing a pillow or rolled up towels firmly against it. Once you are able to get out of bed, walk around indoors and cough well. You may stop using the incentive spirometer when instructed by your caregiver.  RISKS AND COMPLICATIONS  Take your time so you do not get dizzy or light-headed.  If you are in pain, you may  need to take or ask for pain medication before doing incentive spirometry. It is harder to take a deep breath if you are having pain. AFTER USE  Rest and breathe slowly and easily.  It can be helpful to keep track of a log of your progress. Your caregiver can provide you with a simple table to help with this. If you are using the spirometer at home, follow these instructions: [place] IF:   You are having difficultly using the spirometer.  You have trouble using the spirometer as often as instructed.  Your pain medication is not giving enough relief while using the spirometer.  You develop fever of 100.5 F (38.1 C) or higher. SEEK IMMEDIATE MEDICAL CARE IF:   You cough up bloody sputum that had not been present before.  You develop fever of 102 F (38.9 C) or greater.  You develop worsening pain at or near the incision site. MAKE SURE YOU:   Understand these instructions.  Will watch your condition.  Will get help right away if you are not doing well or get worse. Document Released: 02/19/2007 Document Revised: 01/01/2012 Document Reviewed: 04/22/2007 ExitCare Patient Information 2014 ExitCare, [place].   ________________________________________________________________________  WHAT IS A BLOOD TRANSFUSION? Blood Transfusion Information  A transfusion is the replacement of blood or some of its parts. Blood is made up of multiple cells which provide different functions.  Red blood cells carry oxygen and are used for blood loss replacement.  White blood cells fight against infection.  Platelets control bleeding.  Plasma helps clot blood.  Other blood products are available for specialized needs, such as hemophilia or other clotting disorders. BEFORE THE TRANSFUSION  Who gives blood for transfusions?   Healthy volunteers who are fully evaluated to make sure their blood is safe. This is blood bank blood. Transfusion therapy is the safest it has ever been in  the practice of medicine. Before blood is taken from a donor, a complete history is taken to make sure that person has no history of diseases nor engages in risky social behavior (examples are intravenous drug use or sexual activity with multiple partners). The donor's travel history is screened to minimize risk of transmitting infections, such as malaria. The donated blood is tested for signs of infectious diseases, such as HIV and hepatitis. The blood is then tested to be sure it is compatible with you in order to minimize the chance of a transfusion reaction. If you or a relative donates blood, this is often done in anticipation of surgery and is not appropriate for emergency situations. It takes many days to process the donated blood. RISKS AND COMPLICATIONS Although transfusion therapy is very safe and saves many lives, the main dangers of transfusion include:   Getting an infectious disease.  Developing a transfusion reaction. This is an allergic reaction to something in the blood you were given. Every precaution is taken to prevent this. The decision to have a blood transfusion has been considered carefully by your caregiver before blood is given. Blood is not given unless the benefits outweigh the risks. AFTER THE TRANSFUSION  Right  after receiving a blood transfusion, you will usually feel much better and more energetic. This is especially true if your red blood cells have gotten low (anemic). The transfusion raises the level of the red blood cells which carry oxygen, and this usually causes an energy increase.  The nurse administering the transfusion will monitor you carefully for complications. HOME CARE INSTRUCTIONS  No special instructions are needed after a transfusion. You may find your energy is better. Speak with your caregiver about any limitations on activity for underlying diseases you may have. SEEK MEDICAL CARE IF:   Your condition is not improving after your  transfusion.  You develop redness or irritation at the intravenous (IV) site. SEEK IMMEDIATE MEDICAL CARE IF:  Any of the following symptoms occur over the next 12 hours:  Shaking chills.  You have a temperature by mouth above 102 F (38.9 C), not controlled by medicine.  Chest, back, or muscle pain.  People around you feel you are not acting correctly or are confused.  Shortness of breath or difficulty breathing.  Dizziness and fainting.  You get a rash or develop hives.  You have a decrease in urine output.  Your urine turns a dark color or changes to pink, red, or brown. Any of the following symptoms occur over the next 10 days:  You have a temperature by mouth above 102 F (38.9 C), not controlled by medicine.  Shortness of breath.  Weakness after normal activity.  The white part of the eye turns yellow (jaundice).  You have a decrease in the amount of urine or are urinating less often.  Your urine turns a dark color or changes to pink, red, or brown. Document Released: 10/06/2000 Document Revised: 01/01/2012 Document Reviewed: 05/25/2008 ExitCare Patient Information 2014 ExitCare, [place].  _______________________________________________________________________    CLEAR LIQUID DIET   Foods Allowed                                                                     Foods Excluded  Coffee and tea, regular and decaf                             liquids that you cannot  Plain Jell-O in any flavor                                             see through such as: Fruit ices (not with fruit pulp)                                     milk, soups, orange juice  Iced Popsicles                                    All solid food Carbonated beverages, regular and diet  Cranberry, grape and apple juices Sports drinks like Gatorade Lightly seasoned clear broth or consume(fat free) Sugar, honey syrup  Sample Menu Breakfast                                 Lunch                                     Supper Cranberry juice                    Beef broth                            Chicken broth Jell-O                                     Grape juice                           Apple juice Coffee or tea                        Jell-O                                      Popsicle                                                Coffee or tea                        Coffee or tea  _____________________________________________________________________

## 2016-03-13 ENCOUNTER — Encounter (HOSPITAL_COMMUNITY): Payer: Self-pay | Admitting: *Deleted

## 2016-03-13 ENCOUNTER — Inpatient Hospital Stay (HOSPITAL_COMMUNITY): Payer: Medicare Other | Admitting: Anesthesiology

## 2016-03-13 ENCOUNTER — Encounter (HOSPITAL_COMMUNITY)
Admission: RE | Disposition: A | Discharge status: Skilled Nursing Facility | Payer: Self-pay | Source: Ambulatory Visit | Attending: Orthopedic Surgery

## 2016-03-13 ENCOUNTER — Inpatient Hospital Stay (HOSPITAL_COMMUNITY)
Admission: RE | Admit: 2016-03-13 | Discharge: 2016-03-15 | DRG: 470 | Disposition: A | Discharge status: Skilled Nursing Facility | Payer: Medicare Other | Source: Ambulatory Visit | Attending: Orthopedic Surgery | Admitting: Orthopedic Surgery

## 2016-03-13 DIAGNOSIS — K219 Gastro-esophageal reflux disease without esophagitis: Secondary | ICD-10-CM | POA: Diagnosis present

## 2016-03-13 DIAGNOSIS — M6281 Muscle weakness (generalized): Secondary | ICD-10-CM | POA: Diagnosis not present

## 2016-03-13 DIAGNOSIS — M25562 Pain in left knee: Secondary | ICD-10-CM | POA: Diagnosis present

## 2016-03-13 DIAGNOSIS — E890 Postprocedural hypothyroidism: Secondary | ICD-10-CM | POA: Diagnosis present

## 2016-03-13 DIAGNOSIS — M1712 Unilateral primary osteoarthritis, left knee: Principal | ICD-10-CM | POA: Diagnosis present

## 2016-03-13 DIAGNOSIS — R262 Difficulty in walking, not elsewhere classified: Secondary | ICD-10-CM | POA: Diagnosis not present

## 2016-03-13 DIAGNOSIS — I10 Essential (primary) hypertension: Secondary | ICD-10-CM | POA: Diagnosis present

## 2016-03-13 DIAGNOSIS — E669 Obesity, unspecified: Secondary | ICD-10-CM | POA: Diagnosis not present

## 2016-03-13 DIAGNOSIS — E66811 Obesity, class 1: Secondary | ICD-10-CM | POA: Diagnosis present

## 2016-03-13 DIAGNOSIS — M659 Synovitis and tenosynovitis, unspecified: Secondary | ICD-10-CM | POA: Diagnosis present

## 2016-03-13 DIAGNOSIS — Z6830 Body mass index (BMI) 30.0-30.9, adult: Secondary | ICD-10-CM

## 2016-03-13 DIAGNOSIS — Z853 Personal history of malignant neoplasm of breast: Secondary | ICD-10-CM | POA: Diagnosis not present

## 2016-03-13 DIAGNOSIS — M179 Osteoarthritis of knee, unspecified: Secondary | ICD-10-CM | POA: Diagnosis not present

## 2016-03-13 DIAGNOSIS — Z96652 Presence of left artificial knee joint: Secondary | ICD-10-CM | POA: Diagnosis not present

## 2016-03-13 DIAGNOSIS — Z96659 Presence of unspecified artificial knee joint: Secondary | ICD-10-CM

## 2016-03-13 DIAGNOSIS — R2681 Unsteadiness on feet: Secondary | ICD-10-CM | POA: Diagnosis not present

## 2016-03-13 DIAGNOSIS — Z471 Aftercare following joint replacement surgery: Secondary | ICD-10-CM | POA: Diagnosis not present

## 2016-03-13 DIAGNOSIS — M1990 Unspecified osteoarthritis, unspecified site: Secondary | ICD-10-CM | POA: Diagnosis not present

## 2016-03-13 HISTORY — PX: TOTAL KNEE ARTHROPLASTY: SHX125

## 2016-03-13 SURGERY — ARTHROPLASTY, KNEE, TOTAL
Anesthesia: Spinal | Site: Knee | Laterality: Left

## 2016-03-13 MED ORDER — DOCUSATE SODIUM 100 MG PO CAPS
100.0000 mg | ORAL_CAPSULE | Freq: Two times a day (BID) | ORAL | Status: DC
Start: 2016-03-13 — End: 2016-03-15
  Administered 2016-03-13 – 2016-03-15 (×4): 100 mg via ORAL
  Filled 2016-03-13 (×4): qty 1

## 2016-03-13 MED ORDER — PROPOFOL 10 MG/ML IV BOLUS
INTRAVENOUS | Status: AC
  Filled 2016-03-13: qty 60

## 2016-03-13 MED ORDER — VANCOMYCIN HCL IN DEXTROSE 1-5 GM/200ML-% IV SOLN
1000.0000 mg | Freq: Two times a day (BID) | INTRAVENOUS | Status: AC
  Administered 2016-03-14: 1000 mg via INTRAVENOUS
  Filled 2016-03-13: qty 200

## 2016-03-13 MED ORDER — DIPHENHYDRAMINE HCL 25 MG PO CAPS
25.0000 mg | ORAL_CAPSULE | Freq: Four times a day (QID) | ORAL | Status: DC | PRN
  Administered 2016-03-14 (×2): 25 mg via ORAL
  Filled 2016-03-13 (×2): qty 1

## 2016-03-13 MED ORDER — KETOROLAC TROMETHAMINE 30 MG/ML IJ SOLN
INTRAMUSCULAR | Status: DC | PRN
  Administered 2016-03-13: 30 mg

## 2016-03-13 MED ORDER — METOCLOPRAMIDE HCL 5 MG PO TABS: 5.0000 mg | ORAL_TABLET | Freq: Three times a day (TID) | ORAL | Status: DC | PRN

## 2016-03-13 MED ORDER — PROPOFOL 10 MG/ML IV BOLUS
INTRAVENOUS | Status: AC
  Filled 2016-03-13: qty 20

## 2016-03-13 MED ORDER — PHENOL 1.4 % MT LIQD: 1.0000 | OROMUCOSAL | Status: DC | PRN

## 2016-03-13 MED ORDER — 0.9 % SODIUM CHLORIDE (POUR BTL) OPTIME
TOPICAL | Status: DC | PRN
  Administered 2016-03-13: 1000 mL

## 2016-03-13 MED ORDER — BISACODYL 10 MG RE SUPP: 10.0000 mg | Freq: Every day | RECTAL | Status: DC | PRN

## 2016-03-13 MED ORDER — MIDAZOLAM HCL 2 MG/2ML IJ SOLN
INTRAMUSCULAR | Status: AC
  Filled 2016-03-13: qty 2

## 2016-03-13 MED ORDER — PRAVASTATIN SODIUM 20 MG PO TABS
20.0000 mg | ORAL_TABLET | Freq: Every day | ORAL | Status: DC
Start: 2016-03-13 — End: 2016-03-15
  Administered 2016-03-13 – 2016-03-14 (×2): 20 mg via ORAL
  Filled 2016-03-13 (×2): qty 1

## 2016-03-13 MED ORDER — DEXAMETHASONE SODIUM PHOSPHATE 10 MG/ML IJ SOLN
10.0000 mg | Freq: Once | INTRAMUSCULAR | Status: AC
  Administered 2016-03-13: 10 mg via INTRAVENOUS

## 2016-03-13 MED ORDER — MAGNESIUM CITRATE PO SOLN: 1.0000 | Freq: Once | ORAL | Status: DC | PRN

## 2016-03-13 MED ORDER — LOSARTAN POTASSIUM 25 MG PO TABS
25.0000 mg | ORAL_TABLET | Freq: Every day | ORAL | Status: DC
  Administered 2016-03-13 – 2016-03-15 (×3): 25 mg via ORAL
  Filled 2016-03-13 (×3): qty 1

## 2016-03-13 MED ORDER — HYDROCHLOROTHIAZIDE 25 MG PO TABS
25.0000 mg | ORAL_TABLET | Freq: Every day | ORAL | Status: DC
  Administered 2016-03-13 – 2016-03-15 (×3): 25 mg via ORAL
  Filled 2016-03-13 (×3): qty 1

## 2016-03-13 MED ORDER — ONDANSETRON HCL 4 MG/2ML IJ SOLN: 4.0000 mg | Freq: Four times a day (QID) | INTRAMUSCULAR | Status: DC | PRN

## 2016-03-13 MED ORDER — METOCLOPRAMIDE HCL 5 MG/ML IJ SOLN: 5.0000 mg | Freq: Three times a day (TID) | INTRAMUSCULAR | Status: DC | PRN

## 2016-03-13 MED ORDER — BUPIVACAINE IN DEXTROSE 0.75-8.25 % IT SOLN
INTRATHECAL | Status: DC | PRN
  Administered 2016-03-13: 2 mL via INTRATHECAL

## 2016-03-13 MED ORDER — CELECOXIB 200 MG PO CAPS
200.0000 mg | ORAL_CAPSULE | Freq: Two times a day (BID) | ORAL | Status: DC
  Administered 2016-03-13 – 2016-03-15 (×4): 200 mg via ORAL
  Filled 2016-03-13 (×4): qty 1

## 2016-03-13 MED ORDER — SODIUM CHLORIDE 0.9 % IV SOLN
INTRAVENOUS | Status: DC
  Administered 2016-03-13: 100 mL/h via INTRAVENOUS

## 2016-03-13 MED ORDER — BUPIVACAINE-EPINEPHRINE (PF) 0.25% -1:200000 IJ SOLN
INTRAMUSCULAR | Status: DC | PRN
  Administered 2016-03-13: 30 mL via PERINEURAL

## 2016-03-13 MED ORDER — POLYETHYLENE GLYCOL 3350 17 G PO PACK
17.0000 g | PACK | Freq: Two times a day (BID) | ORAL | Status: DC
  Administered 2016-03-14 – 2016-03-15 (×2): 17 g via ORAL
  Filled 2016-03-13 (×3): qty 1

## 2016-03-13 MED ORDER — METHOCARBAMOL 1000 MG/10ML IJ SOLN
500.0000 mg | Freq: Four times a day (QID) | INTRAVENOUS | Status: DC | PRN
  Administered 2016-03-13: 500 mg via INTRAVENOUS
  Filled 2016-03-13: qty 5
  Filled 2016-03-13: qty 550

## 2016-03-13 MED ORDER — DEXAMETHASONE SODIUM PHOSPHATE 10 MG/ML IJ SOLN: 10.0000 mg | Freq: Once | INTRAMUSCULAR | Status: DC

## 2016-03-13 MED ORDER — FENTANYL CITRATE (PF) 100 MCG/2ML IJ SOLN
INTRAMUSCULAR | Status: DC | PRN
  Administered 2016-03-13: 100 ug via INTRAVENOUS

## 2016-03-13 MED ORDER — VANCOMYCIN HCL IN DEXTROSE 1-5 GM/200ML-% IV SOLN
1000.0000 mg | INTRAVENOUS | Status: AC
  Administered 2016-03-13 (×2): 1000 mg via INTRAVENOUS
  Filled 2016-03-13: qty 200

## 2016-03-13 MED ORDER — SODIUM CHLORIDE 0.9 % IJ SOLN
INTRAMUSCULAR | Status: DC | PRN
  Administered 2016-03-13: 30 mL

## 2016-03-13 MED ORDER — NON FORMULARY
20.0000 mg | Freq: Every day | Status: DC
Start: 2016-03-14 — End: 2016-03-13

## 2016-03-13 MED ORDER — CARVEDILOL 6.25 MG PO TABS
6.2500 mg | ORAL_TABLET | Freq: Two times a day (BID) | ORAL | Status: DC
  Administered 2016-03-13 – 2016-03-15 (×4): 6.25 mg via ORAL
  Filled 2016-03-13 (×4): qty 1

## 2016-03-13 MED ORDER — HYDROMORPHONE HCL 1 MG/ML IJ SOLN
INTRAMUSCULAR | Status: AC
  Filled 2016-03-13: qty 1

## 2016-03-13 MED ORDER — STERILE WATER FOR IRRIGATION IR SOLN
Status: DC | PRN
  Administered 2016-03-13: 2000 mL

## 2016-03-13 MED ORDER — ASPIRIN EC 325 MG PO TBEC
325.0000 mg | DELAYED_RELEASE_TABLET | Freq: Two times a day (BID) | ORAL | Status: DC
  Administered 2016-03-14 – 2016-03-15 (×3): 325 mg via ORAL
  Filled 2016-03-13 (×3): qty 1

## 2016-03-13 MED ORDER — LACTATED RINGERS IV SOLN
INTRAVENOUS | Status: DC
  Administered 2016-03-13 (×3): via INTRAVENOUS

## 2016-03-13 MED ORDER — PROPOFOL 500 MG/50ML IV EMUL
INTRAVENOUS | Status: DC | PRN
  Administered 2016-03-13: 75 ug/kg/min via INTRAVENOUS

## 2016-03-13 MED ORDER — PROPOFOL 10 MG/ML IV BOLUS
INTRAVENOUS | Status: DC | PRN
  Administered 2016-03-13: 20 mg via INTRAVENOUS

## 2016-03-13 MED ORDER — ALUM & MAG HYDROXIDE-SIMETH 200-200-20 MG/5ML PO SUSP: 30.0000 mL | ORAL | Status: DC | PRN

## 2016-03-13 MED ORDER — DEXAMETHASONE SODIUM PHOSPHATE 10 MG/ML IJ SOLN
INTRAMUSCULAR | Status: AC
  Filled 2016-03-13: qty 1

## 2016-03-13 MED ORDER — METHOCARBAMOL 500 MG PO TABS
500.0000 mg | ORAL_TABLET | Freq: Four times a day (QID) | ORAL | Status: DC | PRN
  Administered 2016-03-14 – 2016-03-15 (×5): 500 mg via ORAL
  Filled 2016-03-13 (×5): qty 1

## 2016-03-13 MED ORDER — CHLORHEXIDINE GLUCONATE 4 % EX LIQD
60.0000 mL | Freq: Once | CUTANEOUS | Status: AC
  Administered 2016-03-13: 4 via TOPICAL

## 2016-03-13 MED ORDER — TRANEXAMIC ACID 1000 MG/10ML IV SOLN
1000.0000 mg | Freq: Once | INTRAVENOUS | Status: AC
  Administered 2016-03-13: 1000 mg via INTRAVENOUS
  Filled 2016-03-13: qty 10

## 2016-03-13 MED ORDER — SODIUM CHLORIDE 0.9 % IR SOLN
Status: DC | PRN
  Administered 2016-03-13: 1000 mL

## 2016-03-13 MED ORDER — LEVOTHYROXINE SODIUM 100 MCG PO TABS
100.0000 ug | ORAL_TABLET | Freq: Every day | ORAL | Status: DC
  Administered 2016-03-14 – 2016-03-15 (×2): 100 ug via ORAL
  Filled 2016-03-13 (×2): qty 1

## 2016-03-13 MED ORDER — KETOROLAC TROMETHAMINE 15 MG/ML IJ SOLN
15.0000 mg | Freq: Four times a day (QID) | INTRAMUSCULAR | Status: DC | PRN
  Administered 2016-03-13: 15 mg via INTRAVENOUS
  Filled 2016-03-13 (×2): qty 1

## 2016-03-13 MED ORDER — KETOROLAC TROMETHAMINE 30 MG/ML IJ SOLN
INTRAMUSCULAR | Status: AC
  Filled 2016-03-13: qty 1

## 2016-03-13 MED ORDER — OMEPRAZOLE 20 MG PO CPDR
20.0000 mg | DELAYED_RELEASE_CAPSULE | Freq: Every day | ORAL | Status: DC
Start: 2016-03-14 — End: 2016-03-15
  Administered 2016-03-14 – 2016-03-15 (×2): 20 mg via ORAL
  Filled 2016-03-13 (×2): qty 1

## 2016-03-13 MED ORDER — MIDAZOLAM HCL 5 MG/5ML IJ SOLN
INTRAMUSCULAR | Status: DC | PRN
  Administered 2016-03-13: 1 mg via INTRAVENOUS

## 2016-03-13 MED ORDER — BUPIVACAINE-EPINEPHRINE (PF) 0.25% -1:200000 IJ SOLN
INTRAMUSCULAR | Status: AC
  Filled 2016-03-13: qty 30

## 2016-03-13 MED ORDER — ONDANSETRON HCL 4 MG PO TABS
4.0000 mg | ORAL_TABLET | Freq: Four times a day (QID) | ORAL | Status: DC | PRN
Start: 2016-03-13 — End: 2016-03-15

## 2016-03-13 MED ORDER — SODIUM CHLORIDE 0.9 % IJ SOLN
INTRAMUSCULAR | Status: AC
  Filled 2016-03-13: qty 50

## 2016-03-13 MED ORDER — HYDROMORPHONE HCL 1 MG/ML IJ SOLN
0.5000 mg | INTRAMUSCULAR | Status: DC | PRN
  Administered 2016-03-13: 1 mg via INTRAVENOUS
  Administered 2016-03-14 (×2): 2 mg via INTRAVENOUS
  Filled 2016-03-13 (×2): qty 2
  Filled 2016-03-13 (×2): qty 1

## 2016-03-13 MED ORDER — HYDROCODONE-ACETAMINOPHEN 7.5-325 MG PO TABS
1.0000 | ORAL_TABLET | ORAL | Status: DC
  Administered 2016-03-13 – 2016-03-15 (×11): 2 via ORAL
  Filled 2016-03-13 (×11): qty 2

## 2016-03-13 MED ORDER — FERROUS SULFATE 325 (65 FE) MG PO TABS
325.0000 mg | ORAL_TABLET | Freq: Three times a day (TID) | ORAL | Status: DC
  Administered 2016-03-14 – 2016-03-15 (×2): 325 mg via ORAL
  Filled 2016-03-13 (×2): qty 1

## 2016-03-13 MED ORDER — LACTATED RINGERS IV SOLN: INTRAVENOUS | Status: DC

## 2016-03-13 MED ORDER — MENTHOL 3 MG MT LOZG: 1.0000 | LOZENGE | OROMUCOSAL | Status: DC | PRN

## 2016-03-13 MED ORDER — FENTANYL CITRATE (PF) 100 MCG/2ML IJ SOLN
INTRAMUSCULAR | Status: AC
  Filled 2016-03-13: qty 2

## 2016-03-13 MED ORDER — HYDROMORPHONE HCL 1 MG/ML IJ SOLN
0.2500 mg | INTRAMUSCULAR | Status: DC | PRN
  Administered 2016-03-13 (×2): 0.5 mg via INTRAVENOUS

## 2016-03-13 SURGICAL SUPPLY — 49 items
BAG ZIPLOCK 12X15 (MISCELLANEOUS) ×3 IMPLANT
BANDAGE ACE 6X5 VEL STRL LF (GAUZE/BANDAGES/DRESSINGS) ×3 IMPLANT
BLADE SAW SGTL 13.0X1.19X90.0M (BLADE) ×3 IMPLANT
BOWL SMART MIX CTS (DISPOSABLE) ×3 IMPLANT
CAP KNEE TOTAL 3 SIGMA ×3 IMPLANT
CEMENT HV SMART SET (Cement) ×6 IMPLANT
CLOTH BEACON ORANGE TIMEOUT ST (SAFETY) ×3 IMPLANT
CUFF TOURN SGL QUICK 34 (TOURNIQUET CUFF) ×2
CUFF TRNQT CYL 34X4X40X1 (TOURNIQUET CUFF) ×1 IMPLANT
DECANTER SPIKE VIAL GLASS SM (MISCELLANEOUS) ×3 IMPLANT
DRAPE U-SHAPE 47X51 STRL (DRAPES) ×3 IMPLANT
DRESSING AQUACEL AG SP 3.5X10 (GAUZE/BANDAGES/DRESSINGS) ×1 IMPLANT
DRSG AQUACEL AG SP 3.5X10 (GAUZE/BANDAGES/DRESSINGS) ×3
DURAPREP 26ML APPLICATOR (WOUND CARE) ×6 IMPLANT
ELECT REM PT RETURN 9FT ADLT (ELECTROSURGICAL) ×3
ELECTRODE REM PT RTRN 9FT ADLT (ELECTROSURGICAL) ×1 IMPLANT
GLOVE BIO SURGEON STRL SZ7.5 (GLOVE) ×3 IMPLANT
GLOVE BIOGEL PI IND STRL 6.5 (GLOVE) ×2 IMPLANT
GLOVE BIOGEL PI IND STRL 7.0 (GLOVE) ×1 IMPLANT
GLOVE BIOGEL PI IND STRL 7.5 (GLOVE) ×3 IMPLANT
GLOVE BIOGEL PI IND STRL 8.5 (GLOVE) ×1 IMPLANT
GLOVE BIOGEL PI INDICATOR 6.5 (GLOVE) ×4
GLOVE BIOGEL PI INDICATOR 7.0 (GLOVE) ×2
GLOVE BIOGEL PI INDICATOR 7.5 (GLOVE) ×6
GLOVE BIOGEL PI INDICATOR 8.5 (GLOVE) ×2
GLOVE ECLIPSE 6.5 STRL STRAW (GLOVE) ×6 IMPLANT
GLOVE ECLIPSE 8.0 STRL XLNG CF (GLOVE) ×6 IMPLANT
GLOVE ORTHO TXT STRL SZ7.5 (GLOVE) ×6 IMPLANT
GLOVE SURG SS PI 6.5 STRL IVOR (GLOVE) ×3 IMPLANT
GLOVE SURG SS PI 7.5 STRL IVOR (GLOVE) ×3 IMPLANT
GOWN STRL REUS W/ TWL XL LVL3 (GOWN DISPOSABLE) ×1 IMPLANT
GOWN STRL REUS W/TWL LRG LVL3 (GOWN DISPOSABLE) ×3 IMPLANT
GOWN STRL REUS W/TWL XL LVL3 (GOWN DISPOSABLE) ×14 IMPLANT
HANDPIECE INTERPULSE COAX TIP (DISPOSABLE) ×2
LIQUID BAND (GAUZE/BANDAGES/DRESSINGS) ×3 IMPLANT
MANIFOLD NEPTUNE II (INSTRUMENTS) ×3 IMPLANT
PACK TOTAL KNEE CUSTOM (KITS) ×3 IMPLANT
POSITIONER SURGICAL ARM (MISCELLANEOUS) ×3 IMPLANT
SET HNDPC FAN SPRY TIP SCT (DISPOSABLE) ×1 IMPLANT
SET PAD KNEE POSITIONER (MISCELLANEOUS) ×3 IMPLANT
SUT MNCRL AB 4-0 PS2 18 (SUTURE) ×3 IMPLANT
SUT VIC AB 1 CT1 36 (SUTURE) ×3 IMPLANT
SUT VIC AB 2-0 CT1 27 (SUTURE) ×6
SUT VIC AB 2-0 CT1 TAPERPNT 27 (SUTURE) ×3 IMPLANT
SUT VLOC 180 0 24IN GS25 (SUTURE) ×3 IMPLANT
SYR 50ML LL SCALE MARK (SYRINGE) ×3 IMPLANT
TRAY FOLEY W/METER SILVER 14FR (SET/KITS/TRAYS/PACK) ×3 IMPLANT
WRAP KNEE MAXI GEL POST OP (GAUZE/BANDAGES/DRESSINGS) ×3 IMPLANT
YANKAUER SUCT BULB TIP 10FT TU (MISCELLANEOUS) ×3 IMPLANT

## 2016-03-13 NOTE — Transfer of Care (Signed)
Immediate Anesthesia Transfer of Care Note  Patient: Zoe [name]  Procedure(s) Performed: Procedure(s): TOTAL KNEE ARTHROPLASTY (Left)  Patient Location: PACU  Anesthesia Type:Spinal  Level of Consciousness: awake, alert , oriented and patient cooperative  Airway & Oxygen Therapy: Patient Spontanous Breathing and Patient connected to face mask oxygen  Post-op Assessment: Report given to RN, Post -op Vital signs reviewed and stable and Patient moving all extremities X 4  Post vital signs: stable  Last Vitals:  Filed Vitals:   03/13/16 1058  BP: 141/72  Pulse: 78  Temp: 36.6 C  Resp: 18    Last Pain: There were no vitals filed for this visit.    Patients Stated Pain Goal: 4 ([id number])  Complications: No apparent anesthesia complications  L4 spinal level

## 2016-03-13 NOTE — Anesthesia Procedure Notes (Addendum)
Spinal Patient location during procedure: OR End time: 03/13/2016 2:13 PM Staffing Resident/CRNA: Enrigue Catena E Performed by: resident/CRNA  Preanesthetic Checklist Completed: patient identified, site marked, surgical consent, pre-op evaluation, timeout performed, IV checked, risks and benefits discussed and monitors and equipment checked Spinal Block Patient position: sitting Prep: Betadine Patient monitoring: heart rate, continuous pulse ox and blood pressure Approach: right paramedian Location: L3-4 Injection technique: single-shot Needle Needle type: Spinocan  Needle gauge: 22 G Needle length: 9 cm Assessment Sensory level: T4 Additional Notes Expiration date of kit checked and confirmed. Patient tolerated procedure well, without complications.

## 2016-03-13 NOTE — Interval H&P Note (Signed)
History and Physical Interval Note:  03/13/2016 1:35 PM  Zoe [name]  has presented today for surgery, with the diagnosis of LEFT KNEE OA  The various methods of treatment have been discussed with the patient and family. After consideration of risks, benefits and other options for treatment, the patient has consented to  Procedure(s): TOTAL KNEE ARTHROPLASTY (Left) as a surgical intervention .  The patient's history has been reviewed, patient examined, no change in status, stable for surgery.  I have reviewed the patient's chart and labs.  Questions were answered to the patient's satisfaction.     Mauri Pole

## 2016-03-13 NOTE — Anesthesia Postprocedure Evaluation (Signed)
Anesthesia Post Note  Patient: Zoe [name]  Procedure(s) Performed: Procedure(s) (LRB): TOTAL KNEE ARTHROPLASTY (Left)  Patient location during evaluation: PACU Anesthesia Type: Spinal Level of consciousness: awake and alert Pain management: pain level controlled Vital Signs Assessment: post-procedure vital signs reviewed and stable Respiratory status: spontaneous breathing, nonlabored ventilation, respiratory function stable and patient connected to nasal cannula oxygen Cardiovascular status: blood pressure returned to baseline and stable Postop Assessment: no signs of nausea or vomiting Anesthetic complications: no    Last Vitals:  Filed Vitals:   03/13/16 1844 03/13/16 1940  BP: 173/81 171/81  Pulse: 89 77  Temp: 37.2 C 37.1 C  Resp: 16 16    Last Pain:  Filed Vitals:   03/13/16 2115  PainSc: 4                  Effrey Davidow L

## 2016-03-13 NOTE — Op Note (Signed)
NAME:  Zoe [name] RECORD NO.:  [id number]                             FACILITY:  [facility]      PHYSICIAN:  Pietro Cassis. Alvan Dame, M.D.  DATE OF BIRTH:  Mar 31, 1934      DATE OF PROCEDURE:  03/13/2016                                     OPERATIVE REPORT         PREOPERATIVE DIAGNOSIS:  Left knee osteoarthritis.      POSTOPERATIVE DIAGNOSIS:  Left knee osteoarthritis.      FINDINGS:  The patient was noted to have complete loss of cartilage and   bone-on-bone arthritis with associated osteophytes in the medial and patellofemoral compartments of   the knee with a significant synovitis and associated effusion.      PROCEDURE:  Left total knee replacement.      COMPONENTS USED:  De[employer] Sigma rotating platform posterior stabilized knee   system, a size 3 femur, 2.5 tibia, size 10 mm PS insert, and 35 patellar   button.      SURGEON:  Pietro Cassis. Alvan Dame, M.D.      ASSISTANT:  Danae Orleans, PA-C.      ANESTHESIA:  Spinal.      SPECIMENS:  None.      COMPLICATION:  None.      DRAINS:  None  EBL: <50cc      TOURNIQUET TIME:   Total Tourniquet Time Documented: Thigh (Left) - 27 minutes Total: Thigh (Left) - 27 minutes     The patient was stable to the recovery room.      INDICATION FOR PROCEDURE:  Zoe [name] is a 82 y.o. female patient of   mine.  The patient had been seen, evaluated, and treated conservatively in the   office with medication, activity modification, and injections.  The patient had   radiographic changes of bone-on-bone arthritis with endplate sclerosis and osteophytes noted.      The patient failed conservative measures including medication, injections, and activity modification, and at this point was ready for more definitive measures.   Based on the radiographic changes and failed conservative measures, the patient   decided to proceed with total knee replacement.  Risks of infection,   DVT, component failure, need for  revision surgery, postop course, and   expectations were all   discussed and reviewed.  Consent was obtained for benefit of pain   relief.      PROCEDURE IN DETAIL:  The patient was brought to the operative theater.   Once adequate anesthesia, preoperative antibiotics, 2 gm of Ancef, 1 gm of Tranexamic Acid, and 10 mg of Decadron administered, the patient was positioned supine with the left thigh tourniquet placed.  The  left lower extremity was prepped and draped in sterile fashion.  A time-   out was performed identifying the patient, planned procedure, and   extremity.      The left lower extremity was placed in the [facility] leg holder.  The leg was   exsanguinated, tourniquet elevated to 250 mmHg.  A midline incision was   made followed by median parapatellar arthrotomy.  Following initial   exposure, attention was first directed to the patella.  Precut   measurement was noted to be 21 mm.  I resected down to 14 mm and used a   35 patellar button to restore patellar height as well as cover the cut   surface.      The lug holes were drilled and a metal shim was placed to protect the   patella from retractors and saw blades.      At this point, attention was now directed to the femur.  The femoral   canal was opened with a drill, irrigated to try to prevent fat emboli.  An   intramedullary rod was passed at 3 degrees valgus, 9 mm of bone was   resected off the distal femur.  Following this resection, the tibia was   subluxated anteriorly.  Using the extramedullary guide, 2 mm of bone was resected off   the proximal medial tibia.  We confirmed the gap would be   stable medially and laterally with a 10 mm insert as well as confirmed   the cut was perpendicular in the coronal plane, checking with an alignment rod.      Once this was done, I sized the femur to be a size 3 in the anterior-   posterior dimension, chose a standard component based on medial and   lateral dimension.  The size  3 rotation block was then pinned in   position anterior referenced using the C-clamp to set rotation.  The   anterior, posterior, and  chamfer cuts were made without difficulty nor   notching making certain that I was along the anterior cortex to help   with flexion gap stability.      The final box cut was made off the lateral aspect of distal femur.      At this point, the tibia was sized to be a size 2.5, the size 2.5 tray was   then pinned in position through the medial third of the tubercle,   drilled, and keel punched.  Trial reduction was now carried with a 3 femur,  2.5 tibia, a size 10 mm insert, and the 35 patella botton.  The knee was brought to   extension, full extension with good flexion stability with the patella   tracking through the trochlea without application of pressure.  Given   all these findings, the trial components removed.  Final components were   opened and cement was mixed.  The knee was irrigated with normal saline   solution and pulse lavage.  The synovial lining was   then injected with 30 cc of 0.25% Marcaine with epinephrine and 1 cc of Toradol plus 30 cc of NS for a   total of 61 cc.      The knee was irrigated.  Final implants were then cemented onto clean and   dried cut surfaces of bone with the knee brought to extension with a size 10 mm trial insert.      Once the cement had fully cured, the excess cement was removed   throughout the knee.  I confirmed I was satisfied with the range of   motion and stability, and the final size 10 mm PS insert was chosen.  It was   placed into the knee.      The tourniquet had been let down at 35 minutes.  No significant   hemostasis required.  The   extensor mechanism was then reapproximated using #  1 Vicryl and #0 V-lock sutures with the knee   in flexion.  The   remaining wound was closed with 2-0 Vicryl and running 4-0 Monocryl.   The knee was cleaned, dried, dressed sterilely using Dermabond and   Aquacel  dressing.  The patient was then   brought to recovery room in stable condition, tolerating the procedure   well.   Please note that Physician Assistant, Danae Orleans, PA-C, was present for the entirety of the case, and was utilized for pre-operative positioning, peri-operative retractor management, general facilitation of the procedure.  He was also utilized for primary wound closure at the end of the case.              Pietro Cassis Alvan Dame, M.D.    03/13/2016 3:27 PM

## 2016-03-13 NOTE — Progress Notes (Signed)
Utilization review completed.  

## 2016-03-13 NOTE — Anesthesia Preprocedure Evaluation (Signed)
Anesthesia Evaluation  Patient identified by MRN, date of birth, ID band Patient awake    Reviewed: Allergy & Precautions, H&P , NPO status , Patient's Chart, lab work & pertinent test results  Airway Mallampati: II  TM Distance: >3 FB Neck ROM: full    Dental no notable dental hx. (+) Dental Advisory Given, Teeth Intact   Pulmonary shortness of breath and with exertion,    Pulmonary exam normal breath sounds clear to auscultation       Cardiovascular Exercise Tolerance: Good hypertension, Pt. on home beta blockers and Pt. on medications negative cardio ROS Normal cardiovascular exam Rhythm:regular Rate:Normal     Neuro/Psych negative neurological ROS  negative psych ROS   GI/Hepatic negative GI ROS, Neg liver ROS, GERD  Medicated and Controlled,  Endo/Other  negative endocrine ROSHypothyroidism   Renal/GU negative Renal ROS  negative genitourinary   Musculoskeletal   Abdominal   Peds  Hematology negative hematology ROS (+)   Anesthesia Other Findings   Reproductive/Obstetrics negative OB ROS                             Anesthesia Physical Anesthesia Plan  ASA: II  Anesthesia Plan: Spinal   Post-op Pain Management:    Induction:   Airway Management Planned:   Additional Equipment:   Intra-op Plan:   Post-operative Plan:   Informed Consent: I have reviewed the patients History and Physical, chart, labs and discussed the procedure including the risks, benefits and alternatives for the proposed anesthesia with the patient or authorized representative who has indicated his/her understanding and acceptance.   Dental Advisory Given  Plan Discussed with: CRNA  Anesthesia Plan Comments:         Anesthesia Quick Evaluation

## 2016-03-14 LAB — BASIC METABOLIC PANEL
Anion gap: 6 (ref 5–15)
BUN: 15 mg/dL (ref 6–20)
CALCIUM: 8 mg/dL — AB (ref 8.9–10.3)
CO2: 24 mmol/L (ref 22–32)
CREATININE: 0.6 mg/dL (ref 0.44–1.00)
Chloride: 100 mmol/L — ABNORMAL LOW (ref 101–111)
GFR calc non Af Amer: >60 mL/min (ref >60)
Glucose, Bld: 147 mg/dL — ABNORMAL HIGH (ref 65–99)
Potassium: 4 mmol/L (ref 3.5–5.1)
Sodium: 130 mmol/L — ABNORMAL LOW (ref 135–145)

## 2016-03-14 LAB — CBC
HEMATOCRIT: 33.9 % — AB (ref 36.0–46.0)
Hemoglobin: 11.3 g/dL — ABNORMAL LOW (ref 12.0–15.0)
MCH: 27.8 pg (ref 26.0–34.0)
MCHC: 33.3 g/dL (ref 30.0–36.0)
MCV: 83.5 fL (ref 78.0–100.0)
Platelets: 189 10*3/uL (ref 150–400)
RBC: 4.06 MIL/uL (ref 3.87–5.11)
RDW: 14.4 % (ref 11.5–15.5)
WBC: 13.7 10*3/uL — ABNORMAL HIGH (ref 4.0–10.5)

## 2016-03-14 LAB — TYPE AND SCREEN
ABO/RH(D): O POS
ANTIBODY SCREEN: NEGATIVE

## 2016-03-14 NOTE — Progress Notes (Signed)
Physical Therapy Treatment Patient Details Name: Zoe [name] MRN: [id number] DOB: [birth year] Today's Date: 03/14/2016    History of Present Illness 82 yo female s/p L TKA 03/13/16    PT Comments    POD # 1 pm session Assisted to bathroom to void then amb in hallway an increased distance.  Assisted back to recliner and applied ICE.    Follow Up Recommendations  SNF     Equipment Recommendations  None recommended by PT    Recommendations for Other Services OT consult     Precautions / Restrictions Precautions Precautions: Fall;Knee Restrictions Weight Bearing Restrictions: No LLE Weight Bearing: Weight bearing as tolerated    Mobility  Bed Mobility Overal bed mobility: Needs Assistance Bed Mobility: Supine to Sit     Supine to sit: Min assist     General bed mobility comments: pt OOB in recliner  Transfers Overall transfer level: Needs assistance Equipment used: Rolling walker (2 wheeled) Transfers: Sit to/from Stand Sit to Stand: Min guard Stand pivot transfers: Min assist       General transfer comment: Assist to rise, stabilize, control descent. VCs safety, hand placement.   Ambulation/Gait Ambulation/Gait assistance: Min guard;Min assist Ambulation Distance (Feet): 65 Feet Assistive device: Rolling walker (2 wheeled) Gait Pattern/deviations: Step-to pattern     General Gait Details: VCs safety, sequence. slow gait speed. Tolerated increased distance.   Stairs            Wheelchair Mobility    Modified Rankin (Stroke Patients Only)       Balance                                    Cognition Arousal/Alertness: Awake/alert Behavior During Therapy: WFL for tasks assessed/performed Overall Cognitive Status: Within Functional Limits for tasks assessed                      Exercises Total Joint Exercises Ankle Circles/Pumps: AROM;Both;10 reps;Supine Quad Sets: AROM;Both;10 reps;Supine Hip  ABduction/ADduction: AAROM;Left;10 reps;Supine Straight Leg Raises: AAROM;Left;10 reps;Supine Knee Flexion: AAROM;Left;10 reps;Seated Goniometric ROM: ~5-60 degrees    General Comments        Pertinent Vitals/Pain Pain Assessment: 0-10 Pain Score: 4  Pain Location: L knee Pain Descriptors / Indicators: Aching;Sore Pain Intervention(s): Monitored during session;Premedicated before session;Repositioned;Ice applied    Home Living Family/patient expects to be discharged to:: Skilled nursing facility Living Arrangements: Spouse/significant other                  Prior Function Level of Independence: Independent      Comments: has a cane but does not use it   PT Goals (current goals can now be found in the care plan section) Acute Rehab PT Goals Patient Stated Goal: to go to [facility] for rehab PT Goal Formulation: With patient Time For Goal Achievement: 03/21/16 Potential to Achieve Goals: Good Progress towards PT goals: Progressing toward goals    Frequency  7X/week    PT Plan Current plan remains appropriate    Co-evaluation             End of Session Equipment Utilized During Treatment: Gait belt Activity Tolerance: Patient tolerated treatment well Patient left: in chair;with call bell/phone within reach;with chair alarm set     Time: 1358-1416 PT Time Calculation (min) (ACUTE ONLY): 18 min  Charges:  $Gait Training: 8-22 mins  G Codes:      Rica Koyanagi  PTA [facility]  Acute  Rehab Pager      [phone]

## 2016-03-14 NOTE — Clinical Social Work Note (Signed)
Clinical Social Work Assessment  Patient Details  Name: Zoe [name] MRN: [id number] Date of Birth: January 26, 1934  Date of referral:  03/14/16               Reason for consult:  Facility Placement, Discharge Planning                Permission sought to share information with:  Chartered certified accountant granted to share information::  Yes, Verbal Permission Granted  Name::        Agency::     Relationship::     Contact Information:     Housing/Transportation Living arrangements for the past 2 months:  Single Family Home Source of Information:  Patient Patient Interpreter Needed:  None Criminal Activity/Legal Involvement Pertinent to Current Situation/Hospitalization:  No - Comment as needed Significant Relationships:  Spouse Lives with:  Spouse Do you feel safe going back to the place where you live?  No (SNF recommended.) Need for family participation in patient care:  No (Coment)  Care giving concerns: Pt's care cannot be managed at home following hospital d/c.    Social Worker assessment / plan:  Pt hospitalized on 03/13/16 for pre planned left total knee arthroplasty. CSW met with pt to assist with d/c planning. Pt reports that she has made prior arrangements to have [place] [facility] at d/c. CSW has sent clinicals to SNF and d/c plan has been confirmed. CSW will continue to follow to assist with d/c planning to SNF.  Employment status:  Retired Nurse, adult PT Recommendations:  Reserve / Referral to community resources:  [place]  Patient/Family's Response to care:  Pt feels ST Rehab is needed at d/c.  Patient/Family's Understanding of and Emotional Response to Diagnosis, Current Treatment, and Prognosis:  Pt is aware of her medical status. She is motivated to work with therapy and is looking forward to having rehab at [facility].  Emotional Assessment Appearance:   Appears stated age Attitude/Demeanor/Rapport:  Other (cooperative) Affect (typically observed):  Appropriate, Pleasant Orientation:  Oriented to Self, Oriented to Place, Oriented to  Time, Oriented to Situation Alcohol / Substance use:  Not Applicable Psych involvement (Current and /or in the community):  No (Comment)  Discharge Needs  Concerns to be addressed:  Discharge Planning Concerns Readmission within the last 30 days:  No Current discharge risk:  None Barriers to Discharge:  No Barriers Identified   Loraine Maple  [phone] 03/14/2016, 4:15 PM

## 2016-03-14 NOTE — Evaluation (Signed)
Physical Therapy Evaluation Patient Details Name: Zoe [name] MRN: [id number] DOB: [birth year] Today's Date: 03/14/2016   History of Present Illness  82 yo female s/p L TKA 03/13/16  Clinical Impression  On eval, pt required Min assist for mobility-walked ~60 feet with RW. Pain rated 6/10. Plan is for ST rehab at SNF    Follow Up Recommendations SNF    Equipment Recommendations  None recommended by PT    Recommendations for Other Services OT consult     Precautions / Restrictions Precautions Precautions: Fall;Knee Restrictions Weight Bearing Restrictions: No LLE Weight Bearing: Weight bearing as tolerated Other Position/Activity Restrictions: WBAT      Mobility  Bed Mobility               General bed mobility comments: oob in recliner  Transfers Overall transfer level: Needs assistance Equipment used: Rolling walker (2 wheeled) Transfers: Sit to/from Stand Sit to Stand: Min assist         General transfer comment: Assist to rise, stabilize, control descent. VCs safety, hand placement.   Ambulation/Gait Ambulation/Gait assistance: Min assist Ambulation Distance (Feet): 60 Feet Assistive device: Rolling walker (2 wheeled) Gait Pattern/deviations: Step-to pattern;Step-through pattern;Trunk flexed     General Gait Details: VCs safety, sequence. slow gait speed.   Stairs            Wheelchair Mobility    Modified Rankin (Stroke Patients Only)       Balance                                             Pertinent Vitals/Pain Pain Assessment: 0-10 Pain Score: 6  Pain Location: L knee with activity Pain Descriptors / Indicators: Sore Pain Intervention(s): Ice applied;Limited activity within patient's tolerance;Repositioned    Home Living Family/patient expects to be discharged to:: Skilled nursing facility Living Arrangements: Spouse/significant other                    Prior Function Level of Independence:  Independent         Comments: has a cane but does not use it     Hand Dominance   Dominant Hand: Right    Extremity/Trunk Assessment   Upper Extremity Assessment: Defer to OT evaluation           Lower Extremity Assessment: LLE deficits/detail   LLE Deficits / Details: at least 3/5 throughout  Cervical / Trunk Assessment: Normal  Communication   Communication: No difficulties  Cognition Arousal/Alertness: Awake/alert Behavior During Therapy: WFL for tasks assessed/performed Overall Cognitive Status: Within Functional Limits for tasks assessed                      General Comments      Exercises Total Joint Exercises Ankle Circles/Pumps: AROM;Both;10 reps;Supine Quad Sets: AROM;Both;10 reps;Supine Hip ABduction/ADduction: AAROM;Left;10 reps;Supine Straight Leg Raises: AAROM;Left;10 reps;Supine Knee Flexion: AAROM;Left;10 reps;Seated Goniometric ROM: ~5-60 degrees      Assessment/Plan    PT Assessment Patient needs continued PT services  PT Diagnosis Difficulty walking;Acute pain   PT Problem List Decreased strength;Decreased range of motion;Decreased balance;Decreased mobility;Decreased knowledge of use of DME;Pain  PT Treatment Interventions DME instruction;Gait training;Functional mobility training;Therapeutic activities;Patient/family education;Balance training;Therapeutic exercise   PT Goals (Current goals can be found in the Care Plan section) Acute Rehab PT Goals Patient Stated Goal: to regain PLOF PT Goal  Formulation: With patient Time For Goal Achievement: 03/21/16 Potential to Achieve Goals: Good    Frequency 7X/week   Barriers to discharge        Co-evaluation               End of Session Equipment Utilized During Treatment: Gait belt Activity Tolerance: Patient tolerated treatment well Patient left: in chair;with call bell/phone within reach;with chair alarm set           Time: [id number] PT Time Calculation (min)  (ACUTE ONLY): 21 min   Charges:   PT Evaluation $PT Eval Low Complexity: 1 Procedure     PT G Codes:        Weston Anna, MPT Pager: [phone]

## 2016-03-14 NOTE — Clinical Social Work Placement (Signed)
   CLINICAL SOCIAL WORK PLACEMENT  NOTE  Date:  03/14/2016  Patient Details  Name: Zoe [name] MRN: [id number] Date of Birth: January 14, 1934  Clinical Social Work is seeking post-discharge placement for this patient at the [place] level of care (*CSW will initial, date and re-position this form in  chart as items are completed):  No   Patient/family provided with [place] Work Department's list of facilities offering this level of care within the geographic area requested by the patient (or if unable, by the patient's family).  Yes   Patient/family informed of their freedom to choose among providers that offer the needed level of care, that participate in Medicare, Medicaid or managed care program needed by the patient, have an available bed and are willing to accept the patient.  Yes   Patient/family informed of [facility]'s ownership interest in [facility] and [facility], as well as of the fact that they are under no obligation to receive care at these facilities.  PASRR submitted to EDS on 03/14/16     PASRR number received on 03/14/16     Existing PASRR number confirmed on       FL2 transmitted to all facilities in geographic area requested by pt/family on 03/14/16     FL2 transmitted to all facilities within larger geographic area on       Patient informed that his/her managed care company has contracts with or will negotiate with certain facilities, including the following:        Yes   Patient/family informed of bed offers received.  Patient chooses bed at [facility]     Physician recommends and patient chooses bed at      Patient to be transferred to [facility] at [place] on  .  Patient to be transferred to facility by       Patient family notified on   of transfer.  Name of family member notified:        PHYSICIAN       Additional Comment:     _______________________________________________ Luretha Rued, [place] 03/14/2016, 4:26 PM

## 2016-03-14 NOTE — NC FL2 (Signed)
[place] LEVEL OF CARE SCREENING TOOL     IDENTIFICATION  Patient Name: Zoe [name] Birthdate: [birth year] Sex: female Admission Date (Current Location): 03/13/2016  [facility] and [place] Number:  Herbalist and Address:  [facility],  [place] [address], [place]      Provider Number: [id number]  Attending Physician Name and Address:  Paralee Cancel, MD  Relative Name and Phone Number:       Current Level of Care: Hospital Recommended Level of Care: [place] Prior Approval Number:    Date Approved/Denied:   PASRR Number: [id number]  Discharge Plan: SNF    Current Diagnoses: Patient Active Problem List   Diagnosis Date Noted  . S/P left TKA 03/13/2016  . History of breast cancer, DCIS, lumpectomy March 2000 12/13/2011    Orientation RESPIRATION BLADDER Height & Weight     Self, Time, Situation, Place  Normal Continent Weight: 82.555 kg (182 lb) Height:  5\' 3"  (160 cm)  BEHAVIORAL SYMPTOMS/MOOD NEUROLOGICAL BOWEL NUTRITION STATUS  Other (Comment) (No Behaviors)   Continent Diet  AMBULATORY STATUS COMMUNICATION OF NEEDS Skin   Limited Assist Verbally Surgical wounds                       Personal Care Assistance Level of Assistance  Bathing, Feeding, Dressing Bathing Assistance: Limited assistance Feeding assistance: Independent Dressing Assistance: Limited assistance     Functional Limitations Info  Sight, Hearing, Speech Sight Info: Adequate Hearing Info: Adequate Speech Info: Adequate    SPECIAL CARE FACTORS FREQUENCY  PT (By licensed PT), OT (By licensed OT)     PT Frequency: 5 x wk OT Frequency: 5x wk            Contractures Contractures Info: Not present    Additional Factors Info  Code Status Code Status Info: Full              Current Medications (03/14/2016):  This is the current hospital active medication list Current Facility-Administered Medications   Medication Dose Route Frequency Provider Last Rate Last Dose  . 0.9 %  sodium chloride infusion   Intravenous Continuous Danae Orleans, PA-C 30 mL/hr at 03/14/16 0232    . alum & mag hydroxide-simeth (MAALOX/MYLANTA) 200-200-20 MG/5ML suspension 30 mL  30 mL Oral Q4H PRN Danae Orleans, PA-C      . aspirin EC tablet 325 mg  325 mg Oral BID Danae Orleans, PA-C   325 mg at 03/14/16 [id number]  . bisacodyl (DULCOLAX) suppository 10 mg  10 mg Rectal Daily PRN Danae Orleans, PA-C      . carvedilol (COREG) tablet 6.25 mg  6.25 mg Oral BID Danae Orleans, PA-C   6.25 mg at 03/14/16 [id number]  . celecoxib (CELEBREX) capsule 200 mg  200 mg Oral Q12H Danae Orleans, PA-C   200 mg at 03/14/16 [id number]  . dexamethasone (DECADRON) injection 10 mg  10 mg Intravenous Once Danae Orleans, PA-C      . diphenhydrAMINE (BENADRYL) capsule 25 mg  25 mg Oral Q6H PRN Danae Orleans, PA-C   25 mg at 03/14/16 0950  . docusate sodium (COLACE) capsule 100 mg  100 mg Oral BID Danae Orleans, PA-C   100 mg at 03/14/16 [id number]  . ferrous sulfate tablet 325 mg  325 mg Oral TID PC Danae Orleans, PA-C   325 mg at 03/14/16 0813  . hydrochlorothiazide (HYDRODIURIL) tablet 25 mg  25 mg Oral Daily Danae Orleans, PA-C  25 mg at 03/14/16 [id number]  . HYDROcodone-acetaminophen (NORCO) 7.5-325 MG per tablet 1-2 tablet  1-2 tablet Oral Q4H Danae Orleans, PA-C   2 tablet at 03/14/16 0950  . HYDROmorphone (DILAUDID) injection 0.5-2 mg  0.5-2 mg Intravenous Q2H PRN Danae Orleans, PA-C   2 mg at 03/14/16 0430  . ketorolac (TORADOL) 15 MG/ML injection 15 mg  15 mg Intravenous Q6H PRN Danae Orleans, PA-C   15 mg at 03/13/16 2359  . lactated ringers infusion   Intravenous Continuous Rod Mae, MD 100 mL/hr at 03/13/16 1133    . levothyroxine (SYNTHROID, LEVOTHROID) tablet 100 mcg  100 mcg Oral QAC breakfast Danae Orleans, PA-C   100 mcg at 03/14/16 [id number]  . losartan (COZAAR) tablet 25 mg  25 mg Oral Daily Danae Orleans, PA-C   25 mg at 03/14/16 [id number]  .  magnesium citrate solution 1 Bottle  1 Bottle Oral Once PRN Danae Orleans, PA-C      . menthol-cetylpyridinium (CEPACOL) lozenge 3 mg  1 lozenge Oral PRN Danae Orleans, PA-C       Or  . phenol (CHLORASEPTIC) mouth spray 1 spray  1 spray Mouth/Throat PRN Danae Orleans, PA-C      . methocarbamol (ROBAXIN) tablet 500 mg  500 mg Oral Q6H PRN Danae Orleans, PA-C   500 mg at 03/14/16 [id number]   Or  . methocarbamol (ROBAXIN) 500 mg in dextrose 5 % 50 mL IVPB  500 mg Intravenous Q6H PRN Danae Orleans, PA-C   500 mg at 03/13/16 1618  . metoCLOPramide (REGLAN) tablet 5-10 mg  5-10 mg Oral Q8H PRN Danae Orleans, PA-C       Or  . metoCLOPramide (REGLAN) injection 5-10 mg  5-10 mg Intravenous Q8H PRN Danae Orleans, PA-C      . omeprazole (PRILOSEC) capsule 20 mg  20 mg Oral QAC breakfast Danae Orleans, PA-C   20 mg at 03/14/16 [id number]  . ondansetron (ZOFRAN) tablet 4 mg  4 mg Oral Q6H PRN Danae Orleans, PA-C       Or  . ondansetron [facility]) injection 4 mg  4 mg Intravenous Q6H PRN Danae Orleans, PA-C      . polyethylene glycol (MIRALAX / GLYCOLAX) packet 17 g  17 g Oral BID Danae Orleans, PA-C   17 g at 03/14/16 [id number]  . pravastatin (PRAVACHOL) tablet 20 mg  20 mg Oral QHS Danae Orleans, PA-C   20 mg at 03/13/16 2216     Discharge Medications: Please see discharge summary for a list of discharge medications.  Relevant Imaging Results:  Relevant Lab Results:   Additional Information SS # [id number]  Jaiceon Collister, Randall An, LCSW

## 2016-03-14 NOTE — Care Management Note (Signed)
Case Management Note  Patient Details  Name: KYLIEANN DEGIROLAMO MRN: [id number] Date of Birth: 1933-11-10  Subjective/Objective:                  TOTAL KNEE ARTHROPLASTY (Left) Action/Plan: Discharge planning Expected Discharge Date:                  Expected Discharge Plan:  [place]  In-House Referral:     Discharge planning Services  CM Consult  Post Acute Care Choice:    Choice offered to:  Patient  DME Arranged:  N/A DME Agency:  NA  HH Arranged:    [place] Agency:  NA  Status of Service:  Completed, signed off  Medicare Important Message Given:    Date Medicare IM Given:    Medicare IM give by:    Date Additional Medicare IM Given:    Additional Medicare Important Message give by:     If discussed at [place] of Stay Meetings, dates discussed:    Additional Comments: CM notes pt to go to SNF [facility]) at discharge; CSW aware and arranging.  No other CM needs were communicated. Dellie Catholic, RN 03/14/2016, 11:23 AM

## 2016-03-14 NOTE — Progress Notes (Signed)
Occupational Therapy Evaluation Patient Details Name: Zoe [name] MRN: [id number] DOB: [birth year] Today's Date: 03/14/2016    History of Present Illness 82 yo female s/p L TKA 03/13/16   Clinical Impression   Patient plans to go to SNF at discharge. All further OT needs can be met at [facility]     Follow Up Recommendations  SNF    Equipment Recommendations  Other (comment) (tbd next venue)    Recommendations for Other Services       Precautions / Restrictions Precautions Precautions: Fall;Knee Restrictions Weight Bearing Restrictions: No LLE Weight Bearing: Weight bearing as tolerated      Mobility Bed Mobility Overal bed mobility: Needs Assistance Bed Mobility: Supine to Sit     Supine to sit: Min assist     General bed mobility comments: for LLE  Transfers Overall transfer level: Needs assistance Equipment used: Rolling walker (2 wheeled) Transfers: Sit to/from [facility] Sit to Stand: Min assist Stand pivot transfers: Min assist       General transfer comment: Assist to rise, stabilize, control descent. VCs safety, hand placement.     Balance                                            ADL Overall ADL's : Needs assistance/impaired Eating/Feeding: Set up;Sitting   Grooming: Set up;Sitting;Wash/dry hands;Wash/dry face;Oral care   Upper Body Bathing: Minimal assitance;Sitting   Lower Body Bathing: Moderate assistance;Sit to/from stand   Upper Body Dressing : Minimal assistance;Sitting   Lower Body Dressing: Maximal assistance;Sit to/from stand   Toilet Transfer: Minimal assistance;Stand-pivot;BSC;RW   Toileting- Clothing Manipulation and Hygiene: Minimal assistance;Sit to/from stand       Functional mobility during ADLs: Minimal assistance;Rolling walker       Vision     Perception     Praxis      Pertinent Vitals/Pain Pain Assessment: 0-10 Pain Score: 5  Pain Location: L knee Pain  Descriptors / Indicators: Aching;Sore Pain Intervention(s): Limited activity within patient's tolerance;Monitored during session;Repositioned;Ice applied     Hand Dominance Right   Extremity/Trunk Assessment Upper Extremity Assessment Upper Extremity Assessment: Overall WFL for tasks assessed   Lower Extremity Assessment Lower Extremity Assessment: Defer to PT evaluation LLE Deficits / Details: at least 3/5 throughout   Cervical / Trunk Assessment Cervical / Trunk Assessment: Normal   Communication Communication Communication: No difficulties   Cognition Arousal/Alertness: Awake/alert Behavior During Therapy: WFL for tasks assessed/performed Overall Cognitive Status: Within Functional Limits for tasks assessed                     General Comments       Exercises       Shoulder Instructions      Home Living Family/patient expects to be discharged to:: Skilled nursing facility Living Arrangements: Spouse/significant other                                      Prior Functioning/Environment Level of Independence: Independent        Comments: has a cane but does not use it    OT Diagnosis: Acute pain   OT Problem List: Decreased strength;Decreased range of motion;Decreased activity tolerance;Decreased knowledge of use of DME or AE;Pain   OT Treatment/Interventions:  OT Goals(Current goals can be found in the care plan section) Acute Rehab OT Goals Patient Stated Goal: to go to [facility] for rehab OT Goal Formulation: All assessment and education complete, DC therapy  OT Frequency:     Barriers to D/C:            Co-evaluation              End of Session Equipment Utilized During Treatment: Surveyor, mining Communication: Other (comment) (pt requests meds for itching)  Activity Tolerance: Patient tolerated treatment well Patient left: in chair;with call bell/phone within reach;with chair alarm set   Time:  [phone] OT Time Calculation (min): 25 min Charges:  OT General Charges $OT Visit: 1 Procedure OT Evaluation $OT Eval Low Complexity: 1 Procedure OT Treatments $Self Care/Home Management : 8-22 mins G-Codes:    Annajulia Lewing A 04/11/2016, 2:42 PM

## 2016-03-15 DIAGNOSIS — Z471 Aftercare following joint replacement surgery: Secondary | ICD-10-CM | POA: Diagnosis not present

## 2016-03-15 DIAGNOSIS — Z96652 Presence of left artificial knee joint: Secondary | ICD-10-CM | POA: Diagnosis not present

## 2016-03-15 DIAGNOSIS — R112 Nausea with vomiting, unspecified: Secondary | ICD-10-CM | POA: Diagnosis not present

## 2016-03-15 DIAGNOSIS — R2681 Unsteadiness on feet: Secondary | ICD-10-CM | POA: Diagnosis not present

## 2016-03-15 DIAGNOSIS — M6281 Muscle weakness (generalized): Secondary | ICD-10-CM | POA: Diagnosis not present

## 2016-03-15 DIAGNOSIS — E669 Obesity, unspecified: Secondary | ICD-10-CM | POA: Diagnosis present

## 2016-03-15 DIAGNOSIS — M1990 Unspecified osteoarthritis, unspecified site: Secondary | ICD-10-CM | POA: Diagnosis not present

## 2016-03-15 DIAGNOSIS — R262 Difficulty in walking, not elsewhere classified: Secondary | ICD-10-CM | POA: Diagnosis not present

## 2016-03-15 LAB — CBC
HEMATOCRIT: 31.6 % — AB (ref 36.0–46.0)
Hemoglobin: 10.3 g/dL — ABNORMAL LOW (ref 12.0–15.0)
MCH: 27.5 pg (ref 26.0–34.0)
MCHC: 32.6 g/dL (ref 30.0–36.0)
MCV: 84.3 fL (ref 78.0–100.0)
PLATELETS: 182 10*3/uL (ref 150–400)
RBC: 3.75 MIL/uL — ABNORMAL LOW (ref 3.87–5.11)
RDW: 14.8 % (ref 11.5–15.5)
WBC: 10.6 10*3/uL — AB (ref 4.0–10.5)

## 2016-03-15 LAB — BASIC METABOLIC PANEL
ANION GAP: 5 (ref 5–15)
BUN: 27 mg/dL — ABNORMAL HIGH (ref 6–20)
CALCIUM: 8.1 mg/dL — AB (ref 8.9–10.3)
CO2: 27 mmol/L (ref 22–32)
Chloride: 97 mmol/L — ABNORMAL LOW (ref 101–111)
Creatinine, Ser: 1.08 mg/dL — ABNORMAL HIGH (ref 0.44–1.00)
GFR, EST AFRICAN AMERICAN: 55 mL/min — AB (ref >60)
GFR, EST NON AFRICAN AMERICAN: 47 mL/min — AB (ref >60)
Glucose, Bld: 111 mg/dL — ABNORMAL HIGH (ref 65–99)
Potassium: 3.8 mmol/L (ref 3.5–5.1)
SODIUM: 129 mmol/L — AB (ref 135–145)

## 2016-03-15 MED ORDER — TIZANIDINE HCL 4 MG PO TABS: 4.0000 mg | ORAL_TABLET | Freq: Four times a day (QID) | ORAL | Status: DC | PRN

## 2016-03-15 MED ORDER — DOCUSATE SODIUM 100 MG PO CAPS: 100.0000 mg | ORAL_CAPSULE | Freq: Two times a day (BID) | ORAL | Status: DC

## 2016-03-15 MED ORDER — POLYETHYLENE GLYCOL 3350 17 G PO PACK: 17.0000 g | PACK | Freq: Two times a day (BID) | ORAL | Status: DC

## 2016-03-15 MED ORDER — HYDROCODONE-ACETAMINOPHEN 7.5-325 MG PO TABS: 1.0000 | ORAL_TABLET | ORAL | Status: DC | PRN

## 2016-03-15 MED ORDER — ASPIRIN 325 MG PO TBEC: 325.0000 mg | DELAYED_RELEASE_TABLET | Freq: Two times a day (BID) | ORAL | Status: AC

## 2016-03-15 MED ORDER — FERROUS SULFATE 325 (65 FE) MG PO TABS: 325.0000 mg | ORAL_TABLET | Freq: Three times a day (TID) | ORAL | Status: DC

## 2016-03-15 NOTE — Clinical Social Work Placement (Signed)
   CLINICAL SOCIAL WORK PLACEMENT  NOTE  Date:  03/15/2016  Patient Details  Name: Zoe [name] MRN: [id number] Date of Birth: [birth year]  Clinical Social Work is seeking post-discharge placement for this patient at the [place] level of care (*CSW will initial, date and re-position this form in  chart as items are completed):  No   Patient/family provided with [place] Work Department's list of facilities offering this level of care within the geographic area requested by the patient (or if unable, by the patient's family).  Yes   Patient/family informed of their freedom to choose among providers that offer the needed level of care, that participate in Medicare, Medicaid or managed care program needed by the patient, have an available bed and are willing to accept the patient.  Yes   Patient/family informed of [place]'s ownership interest in [facility] and [facility], as well as of the fact that they are under no obligation to receive care at these facilities.  PASRR submitted to EDS on 03/14/16     PASRR number received on 03/14/16     Existing PASRR number confirmed on       FL2 transmitted to all facilities in geographic area requested by pt/family on 03/14/16     FL2 transmitted to all facilities within larger geographic area on       Patient informed that his/her managed care company has contracts with or will negotiate with certain facilities, including the following:        Yes   Patient/family informed of bed offers received.  Patient chooses bed at [facility]     Physician recommends and patient chooses bed at      Patient to be transferred to [facility] at [place] on 03/15/16.  Patient to be transferred to facility by [place]     Patient family notified on 03/15/16 of transfer.  Name of family member notified:  SPOUSE     PHYSICIAN       Additional Comment: Pt / spouse are in  agreement with d/c to [facility] today. PT approved transport by car. D/C Summary sent o SNF for review prior to d/c. Scripts included in d/c packet. Packet provided to pt. # for report provided to nsg.   _______________________________________________ Luretha Rued, [place]  [phone] 03/15/2016, 1:35 PM

## 2016-03-15 NOTE — Discharge Summary (Signed)
Physician Discharge Summary  Patient ID: Zoe [name] MRN: [id number] DOB/AGE: [birth year] 82 y.o.  Admit date: 03/13/2016 Discharge date:  03/15/2016  Procedures:  Procedure(s) (LRB): TOTAL KNEE ARTHROPLASTY (Left)  Attending Physician:  Dr. Paralee Cancel   Admission Diagnoses:   Left knee primary OA / pain  Discharge Diagnoses:  Principal Problem:   S/P left TKA Active Problems:   Obese  Past Medical History  Diagnosis Date  . Thyroid disease   . Arthritis     some - per patient  . GERD (gastroesophageal reflux disease)   . Hypertension   . Nasal congestion   . Cataract     bilat   . Wears glasses   . Tinnitus   . Shortness of breath dyspnea     increased exertion   . History of bronchitis   . History of kidney stones   . Stress incontinence   . Cancer [facility])     breast cancer / left   . Hypothyroidism     HPI:    [name], 82 y.o. female, has a history of pain and functional disability in the left knee due to arthritis and has failed non-surgical conservative treatments for greater than 12 weeks to includeNSAID's and/or analgesics, corticosteriod injections, use of assistive devices and activity modification. Onset of symptoms was gradual, starting 1+ years ago with gradually worsening course since that time. The patient noted no past surgery on the left knee(s). Patient currently rates pain in the left knee(s) at 10 out of 10 with activity. Patient has worsening of pain with activity and weight bearing, pain that interferes with activities of daily living, pain with passive range of motion, crepitus and joint swelling. Patient has evidence of periarticular osteophytes and joint space narrowing by imaging studies. There is no active infection. Risks, benefits and expectations were discussed with the patient. Risks including but not limited to the risk of anesthesia, blood clots, nerve damage, blood vessel damage, failure of the prosthesis,  infection and up to and including death. Patient understand the risks, benefits and expectations and wishes to proceed with surgery.   PCP: Jani Gravel, MD   Discharged Condition: good  Hospital Course:  Patient underwent the above stated procedure on 03/13/2016. Patient tolerated the procedure well and brought to the recovery room in good condition and subsequently to the floor.  POD #1 BP: 148/70 ; Pulse: 71 ; Temp: 97.7 F (36.5 C) ; Resp: 15 Patient reports pain as mild, pain controlled. No events throughout the night. Looking forward to working with PT. Planning on SNF upon discharge. Dorsiflexion/plantar flexion intact, incision: dressing C/D/I, no cellulitis present and compartment soft.   LABS  Basename    HGB     11.3  HCT     33.9   POD #2  BP: 132/50 ; Pulse: 64 ; Temp: 97.8 F (3.6 C) ; Resp: 18 Patient reports pain as mild, pain controlled well. No events throughout the night. Ready to be discharged to skilled nursing facility. Dorsiflexion/plantar flexion intact, incision: dressing C/D/I, no cellulitis present and compartment soft.   LABS  Basename    HGB     10.3  HCT     31.6    Discharge Exam: General appearance: alert, cooperative and no distress Extremities: Homans sign is negative, no sign of DVT, no edema, redness or tenderness in the calves or thighs and no ulcers, gangrene or trophic changes  Disposition: Home with follow up in 2 weeks  Follow-up Information    Follow up with Mauri Pole, MD. Schedule an appointment as soon as possible for a visit in 2 weeks.   Specialty:  Orthopedic Surgery   Contact information:   [address] [place] [address] [id number]       Discharge Instructions    Call MD / Call 911    Complete by:  As directed   If you experience chest pain or shortness of breath, CALL 911 and be transported to the hospital emergency room.  If you develope a fever above 101 F, pus (white drainage) or  increased drainage or redness at the wound, or calf pain, call your surgeon's office.     Change dressing    Complete by:  As directed   Maintain surgical dressing until follow up in the clinic. If the edges start to pull up, may reinforce with tape. If the dressing is no longer working, may remove and cover with gauze and tape, but must keep the area dry and clean.  Call with any questions or concerns.     Constipation Prevention    Complete by:  As directed   Drink plenty of fluids.  Prune juice may be helpful.  You may use a stool softener, such as Colace (over the counter) 100 mg twice a day.  Use MiraLax (over the counter) for constipation as needed.     Diet - low sodium heart healthy    Complete by:  As directed      Discharge instructions    Complete by:  As directed   Maintain surgical dressing until follow up in the clinic. If the edges start to pull up, may reinforce with tape. If the dressing is no longer working, may remove and cover with gauze and tape, but must keep the area dry and clean.  Follow up in 2 weeks at [facility]. Call with any questions or concerns.     Increase activity slowly as tolerated    Complete by:  As directed   Weight bearing as tolerated with assist device (walker, cane, etc) as directed, use it as long as suggested by your surgeon or therapist, typically at least 4-6 weeks.     TED hose    Complete by:  As directed   Use stockings (TED hose) for 2 weeks on both leg(s).  You may remove them at night for sleeping.             Medication List    STOP taking these medications        OVER THE COUNTER MEDICATION      TAKE these medications        aspirin 325 MG EC tablet  Take 1 tablet (325 mg total) by mouth 2 (two) times daily.     carvedilol 6.25 MG tablet  Commonly known as:  COREG  Take 6.25 mg by mouth 2 (two) times daily.     docusate sodium 100 MG capsule  Commonly known as:  COLACE  Take 1 capsule (100 mg total) by  mouth 2 (two) times daily.     ferrous sulfate 325 (65 FE) MG tablet  Take 1 tablet (325 mg total) by mouth 3 (three) times daily after meals.     hydrochlorothiazide 25 MG tablet  Commonly known as:  HYDRODIURIL  Take 25 mg by mouth daily.     HYDROcodone-acetaminophen 7.5-325 MG tablet  Commonly known as:  NORCO  Take 1-2 tablets by mouth every 4 (  four) hours as needed for moderate pain.     levothyroxine 100 MCG tablet  Commonly known as:  SYNTHROID, LEVOTHROID  Take 100 mcg by mouth daily before breakfast.     losartan 25 MG tablet  Commonly known as:  COZAAR  Take 25 mg by mouth daily.     omeprazole 20 MG capsule  Commonly known as:  PRILOSEC  Take 20 mg by mouth daily.     polyethylene glycol packet  Commonly known as:  MIRALAX / GLYCOLAX  Take 17 g by mouth 2 (two) times daily.     pravastatin 20 MG tablet  Commonly known as:  PRAVACHOL  Take 20 mg by mouth at bedtime.     tiZANidine 4 MG tablet  Commonly known as:  ZANAFLEX  Take 1 tablet (4 mg total) by mouth every 6 (six) hours as needed for muscle spasms.     Vitamin D3 2000 units Tabs  Take 2,000 Units by mouth daily.         Signed: [place]. Charlese Gruetzmacher   PA-C  03/15/2016, 10:19 AM

## 2016-03-15 NOTE — Progress Notes (Signed)
     Subjective: 2 Days Post-Op Procedure(s) (LRB): TOTAL KNEE ARTHROPLASTY (Left)   Seen by Dr. Alvan Dame. Patient reports pain as mild, pain controlled well.  No events throughout the night. Ready to be discharged to skilled nursing facility.  Objective:   VITALS:   Filed Vitals:   03/14/16 2235 03/15/16 0509  BP: 126/53 132/50  Pulse: 72 64  Temp: 98.1 F (36.7 C) 97.8 F (36.6 C)  Resp: 18 18    Dorsiflexion/Plantar flexion intact Incision: dressing C/D/I No cellulitis present Compartment soft  LABS  Recent Labs  03/14/16 0420 03/15/16 0420  HGB 11.3* 10.3*  HCT 33.9* 31.6*  WBC 13.7* 10.6*  PLT 189 182     Recent Labs  03/14/16 0420 03/15/16 0420  NA 130* 129*  K 4.0 3.8  BUN 15 27*  CREATININE 0.60 1.08*  GLUCOSE 147* 111*     Assessment/Plan: 2 Days Post-Op Procedure(s) (LRB): TOTAL KNEE ARTHROPLASTY (Left) Up with therapy Discharge to SNF  Follow up in 2 weeks at [facility]. Follow up with OLIN,Hailey Miles D in 2 weeks.  Contact information:  [facility] [address] [place] [id number]    Obese (BMI 30-39.9) Estimated body mass index is 32.25 kg/(m^2) as calculated from the following:   Height as of this encounter: 5\' 3"  (1.6 m).   Weight as of this encounter: 82.555 kg (182 lb). Patient also counseled that weight may inhibit the healing process Patient counseled that losing weight will help with future health issues        [place]. Samanthamarie Ezzell   PAC  03/15/2016, 9:21 AM

## 2016-03-15 NOTE — Progress Notes (Signed)
     Subjective: 1 Day Post-Op Procedure(s) (LRB): TOTAL KNEE ARTHROPLASTY (Left)   Seen by Dr. Alvan Dame. Patient reports pain as mild, pain controlled. No events throughout the night.  Looking forward to working with PT.  Planning on SNF upon discharge.  Objective:   VITALS:    03/14/16 0954  BP: 148/70  Pulse: 71  Temp: 97.7 F (36.5 C)  Resp: 15    Dorsiflexion/Plantar flexion intact Incision: dressing C/D/I No cellulitis present Compartment soft  LABS  Recent Labs  03/14/16 0420  HGB 11.3*  HCT 33.9*  WBC 13.7*  PLT 189     Recent Labs  03/14/16 0420  NA 130*  K 4.0  BUN 15  CREATININE 0.60  GLUCOSE 147*     Assessment/Plan: 1 Day Post-Op Procedure(s) (LRB): TOTAL KNEE ARTHROPLASTY (Left) Foley cath d/c'ed Advance diet Up with therapy D/C IV fluids Discharge to SNF  Obese (BMI 30-39.9) Estimated body mass index is 32.25 kg/(m^2) as calculated from the following:   Height as of this encounter: 5\' 3"  (1.6 m).   Weight as of this encounter: 82.555 kg (182 lb). Patient also counseled that weight may inhibit the healing process Patient counseled that losing weight will help with future health issues       Zoe [name]. Zoe [name]   PAC  03/15/2016, 9:23 AM

## 2016-03-15 NOTE — Progress Notes (Signed)
Physical Therapy Treatment Patient Details Name: Zoe [name] MRN: [id number] DOB: May 14, 1934 Today's Date: 03/15/2016    History of Present Illness 82 yo female s/p L TKA 03/13/16    PT Comments    Progressing with mobility. Plan is to d/c to SNF today.   Follow Up Recommendations  SNF     Equipment Recommendations  None recommended by PT    Recommendations for Other Services       Precautions / Restrictions Precautions Precautions: Fall;Knee Restrictions Weight Bearing Restrictions: No LLE Weight Bearing: Weight bearing as tolerated    Mobility  Bed Mobility               General bed mobility comments: pt OOB in recliner  Transfers Overall transfer level: Needs assistance Equipment used: Rolling walker (2 wheeled) Transfers: Sit to/from Stand Sit to Stand: Min guard         General transfer comment: close guard for safety. VCs safety, hand placement.   Ambulation/Gait Ambulation/Gait assistance: Min guard Ambulation Distance (Feet): 115 Feet Assistive device: Rolling walker (2 wheeled) Gait Pattern/deviations: Step-to pattern;Step-through pattern;Decreased stride length     General Gait Details: VCs safety, sequence. slow gait speed. Tolerated increased distance. close guard for safety   Stairs            Wheelchair Mobility    Modified Rankin (Stroke Patients Only)       Balance                                    Cognition Arousal/Alertness: Awake/alert Behavior During Therapy: WFL for tasks assessed/performed Overall Cognitive Status: Within Functional Limits for tasks assessed                      Exercises Total Joint Exercises Ankle Circles/Pumps: AROM;Both;10 reps;Supine Quad Sets: AROM;Both;10 reps;Supine Hip ABduction/ADduction: AAROM;Left;10 reps;Supine Straight Leg Raises: AAROM;Left;10 reps;Supine Knee Flexion: AAROM;Left;10 reps;Seated Goniometric ROM: ~5-75 degrees    General  Comments        Pertinent Vitals/Pain Pain Assessment: 0-10 Pain Score: 5  Pain Location: L knee Pain Descriptors / Indicators: Aching;Sore Pain Intervention(s): Monitored during session    Home Living                      Prior Function            PT Goals (current goals can now be found in the care plan section) Progress towards PT goals: Progressing toward goals    Frequency  7X/week    PT Plan Current plan remains appropriate    Co-evaluation             End of Session Equipment Utilized During Treatment: Gait belt Activity Tolerance: Patient tolerated treatment well Patient left: in chair;with call bell/phone within reach     Time: 0959-1028 PT Time Calculation (min) (ACUTE ONLY): 29 min  Charges:  $Gait Training: 8-22 mins $Therapeutic Exercise: 8-22 mins                    G Codes:      Weston Anna, MPT Pager: [phone]

## 2016-03-15 NOTE — Discharge Instructions (Signed)

## 2016-03-24 DIAGNOSIS — I10 Essential (primary) hypertension: Secondary | ICD-10-CM | POA: Diagnosis not present

## 2016-03-24 DIAGNOSIS — Z7982 Long term (current) use of aspirin: Secondary | ICD-10-CM | POA: Diagnosis not present

## 2016-03-24 DIAGNOSIS — E039 Hypothyroidism, unspecified: Secondary | ICD-10-CM | POA: Diagnosis not present

## 2016-03-24 DIAGNOSIS — Z853 Personal history of malignant neoplasm of breast: Secondary | ICD-10-CM | POA: Diagnosis not present

## 2016-03-24 DIAGNOSIS — Z471 Aftercare following joint replacement surgery: Secondary | ICD-10-CM | POA: Diagnosis not present

## 2016-03-24 DIAGNOSIS — Z79891 Long term (current) use of opiate analgesic: Secondary | ICD-10-CM | POA: Diagnosis not present

## 2016-03-24 DIAGNOSIS — Z96652 Presence of left artificial knee joint: Secondary | ICD-10-CM | POA: Diagnosis not present

## 2016-03-27 DIAGNOSIS — E039 Hypothyroidism, unspecified: Secondary | ICD-10-CM | POA: Diagnosis not present

## 2016-03-27 DIAGNOSIS — Z7982 Long term (current) use of aspirin: Secondary | ICD-10-CM | POA: Diagnosis not present

## 2016-03-27 DIAGNOSIS — Z96652 Presence of left artificial knee joint: Secondary | ICD-10-CM | POA: Diagnosis not present

## 2016-03-27 DIAGNOSIS — Z79891 Long term (current) use of opiate analgesic: Secondary | ICD-10-CM | POA: Diagnosis not present

## 2016-03-27 DIAGNOSIS — Z471 Aftercare following joint replacement surgery: Secondary | ICD-10-CM | POA: Diagnosis not present

## 2016-03-27 DIAGNOSIS — Z853 Personal history of malignant neoplasm of breast: Secondary | ICD-10-CM | POA: Diagnosis not present

## 2016-03-27 DIAGNOSIS — I10 Essential (primary) hypertension: Secondary | ICD-10-CM | POA: Diagnosis not present

## 2016-03-29 DIAGNOSIS — Z96652 Presence of left artificial knee joint: Secondary | ICD-10-CM | POA: Diagnosis not present

## 2016-03-29 DIAGNOSIS — E039 Hypothyroidism, unspecified: Secondary | ICD-10-CM | POA: Diagnosis not present

## 2016-03-29 DIAGNOSIS — I10 Essential (primary) hypertension: Secondary | ICD-10-CM | POA: Diagnosis not present

## 2016-03-29 DIAGNOSIS — Z79891 Long term (current) use of opiate analgesic: Secondary | ICD-10-CM | POA: Diagnosis not present

## 2016-03-29 DIAGNOSIS — Z471 Aftercare following joint replacement surgery: Secondary | ICD-10-CM | POA: Diagnosis not present

## 2016-03-29 DIAGNOSIS — Z7982 Long term (current) use of aspirin: Secondary | ICD-10-CM | POA: Diagnosis not present

## 2016-03-29 DIAGNOSIS — Z853 Personal history of malignant neoplasm of breast: Secondary | ICD-10-CM | POA: Diagnosis not present

## 2016-03-31 DIAGNOSIS — E039 Hypothyroidism, unspecified: Secondary | ICD-10-CM | POA: Diagnosis not present

## 2016-03-31 DIAGNOSIS — Z79891 Long term (current) use of opiate analgesic: Secondary | ICD-10-CM | POA: Diagnosis not present

## 2016-03-31 DIAGNOSIS — Z471 Aftercare following joint replacement surgery: Secondary | ICD-10-CM | POA: Diagnosis not present

## 2016-03-31 DIAGNOSIS — Z96652 Presence of left artificial knee joint: Secondary | ICD-10-CM | POA: Diagnosis not present

## 2016-03-31 DIAGNOSIS — Z7982 Long term (current) use of aspirin: Secondary | ICD-10-CM | POA: Diagnosis not present

## 2016-03-31 DIAGNOSIS — I10 Essential (primary) hypertension: Secondary | ICD-10-CM | POA: Diagnosis not present

## 2016-03-31 DIAGNOSIS — Z853 Personal history of malignant neoplasm of breast: Secondary | ICD-10-CM | POA: Diagnosis not present

## 2016-04-03 DIAGNOSIS — Z96652 Presence of left artificial knee joint: Secondary | ICD-10-CM | POA: Diagnosis not present

## 2016-04-03 DIAGNOSIS — Z853 Personal history of malignant neoplasm of breast: Secondary | ICD-10-CM | POA: Diagnosis not present

## 2016-04-03 DIAGNOSIS — E039 Hypothyroidism, unspecified: Secondary | ICD-10-CM | POA: Diagnosis not present

## 2016-04-03 DIAGNOSIS — I10 Essential (primary) hypertension: Secondary | ICD-10-CM | POA: Diagnosis not present

## 2016-04-03 DIAGNOSIS — Z79891 Long term (current) use of opiate analgesic: Secondary | ICD-10-CM | POA: Diagnosis not present

## 2016-04-03 DIAGNOSIS — Z7982 Long term (current) use of aspirin: Secondary | ICD-10-CM | POA: Diagnosis not present

## 2016-04-03 DIAGNOSIS — Z471 Aftercare following joint replacement surgery: Secondary | ICD-10-CM | POA: Diagnosis not present

## 2016-04-05 DIAGNOSIS — Z853 Personal history of malignant neoplasm of breast: Secondary | ICD-10-CM | POA: Diagnosis not present

## 2016-04-05 DIAGNOSIS — Z471 Aftercare following joint replacement surgery: Secondary | ICD-10-CM | POA: Diagnosis not present

## 2016-04-05 DIAGNOSIS — Z7982 Long term (current) use of aspirin: Secondary | ICD-10-CM | POA: Diagnosis not present

## 2016-04-05 DIAGNOSIS — E039 Hypothyroidism, unspecified: Secondary | ICD-10-CM | POA: Diagnosis not present

## 2016-04-05 DIAGNOSIS — I10 Essential (primary) hypertension: Secondary | ICD-10-CM | POA: Diagnosis not present

## 2016-04-05 DIAGNOSIS — Z79891 Long term (current) use of opiate analgesic: Secondary | ICD-10-CM | POA: Diagnosis not present

## 2016-04-05 DIAGNOSIS — Z96652 Presence of left artificial knee joint: Secondary | ICD-10-CM | POA: Diagnosis not present

## 2016-04-06 DIAGNOSIS — Z471 Aftercare following joint replacement surgery: Secondary | ICD-10-CM | POA: Diagnosis not present

## 2016-04-06 DIAGNOSIS — Z96652 Presence of left artificial knee joint: Secondary | ICD-10-CM | POA: Diagnosis not present

## 2016-04-06 DIAGNOSIS — I10 Essential (primary) hypertension: Secondary | ICD-10-CM | POA: Diagnosis not present

## 2016-04-06 DIAGNOSIS — Z7982 Long term (current) use of aspirin: Secondary | ICD-10-CM | POA: Diagnosis not present

## 2016-04-06 DIAGNOSIS — Z853 Personal history of malignant neoplasm of breast: Secondary | ICD-10-CM | POA: Diagnosis not present

## 2016-04-06 DIAGNOSIS — Z79891 Long term (current) use of opiate analgesic: Secondary | ICD-10-CM | POA: Diagnosis not present

## 2016-04-06 DIAGNOSIS — E039 Hypothyroidism, unspecified: Secondary | ICD-10-CM | POA: Diagnosis not present

## 2016-04-11 DIAGNOSIS — M25562 Pain in left knee: Secondary | ICD-10-CM | POA: Diagnosis not present

## 2016-04-13 DIAGNOSIS — M25562 Pain in left knee: Secondary | ICD-10-CM | POA: Diagnosis not present

## 2016-04-17 DIAGNOSIS — M25562 Pain in left knee: Secondary | ICD-10-CM | POA: Diagnosis not present

## 2016-04-19 DIAGNOSIS — M25562 Pain in left knee: Secondary | ICD-10-CM | POA: Diagnosis not present

## 2016-04-21 DIAGNOSIS — M25562 Pain in left knee: Secondary | ICD-10-CM | POA: Diagnosis not present

## 2016-04-24 DIAGNOSIS — M25562 Pain in left knee: Secondary | ICD-10-CM | POA: Diagnosis not present

## 2016-04-26 DIAGNOSIS — Z96652 Presence of left artificial knee joint: Secondary | ICD-10-CM | POA: Diagnosis not present

## 2016-04-26 DIAGNOSIS — M25562 Pain in left knee: Secondary | ICD-10-CM | POA: Diagnosis not present

## 2016-04-26 DIAGNOSIS — Z471 Aftercare following joint replacement surgery: Secondary | ICD-10-CM | POA: Diagnosis not present

## 2016-04-28 DIAGNOSIS — M25562 Pain in left knee: Secondary | ICD-10-CM | POA: Diagnosis not present

## 2016-05-01 DIAGNOSIS — M25562 Pain in left knee: Secondary | ICD-10-CM | POA: Diagnosis not present

## 2016-05-08 DIAGNOSIS — M25562 Pain in left knee: Secondary | ICD-10-CM | POA: Diagnosis not present

## 2016-05-12 DIAGNOSIS — M25562 Pain in left knee: Secondary | ICD-10-CM | POA: Diagnosis not present

## 2016-05-15 DIAGNOSIS — M25562 Pain in left knee: Secondary | ICD-10-CM | POA: Diagnosis not present

## 2016-05-17 DIAGNOSIS — G43B1 Ophthalmoplegic migraine, intractable: Secondary | ICD-10-CM | POA: Diagnosis not present

## 2016-05-17 DIAGNOSIS — M25562 Pain in left knee: Secondary | ICD-10-CM | POA: Diagnosis not present

## 2016-05-19 DIAGNOSIS — M25562 Pain in left knee: Secondary | ICD-10-CM | POA: Diagnosis not present

## 2016-05-23 DIAGNOSIS — M25562 Pain in left knee: Secondary | ICD-10-CM | POA: Diagnosis not present

## 2016-05-25 DIAGNOSIS — E559 Vitamin D deficiency, unspecified: Secondary | ICD-10-CM | POA: Diagnosis not present

## 2016-05-25 DIAGNOSIS — N390 Urinary tract infection, site not specified: Secondary | ICD-10-CM | POA: Diagnosis not present

## 2016-05-25 DIAGNOSIS — M25562 Pain in left knee: Secondary | ICD-10-CM | POA: Diagnosis not present

## 2016-05-25 DIAGNOSIS — I10 Essential (primary) hypertension: Secondary | ICD-10-CM | POA: Diagnosis not present

## 2016-05-25 DIAGNOSIS — E039 Hypothyroidism, unspecified: Secondary | ICD-10-CM | POA: Diagnosis not present

## 2016-05-29 DIAGNOSIS — M25562 Pain in left knee: Secondary | ICD-10-CM | POA: Diagnosis not present

## 2016-05-30 DIAGNOSIS — I6523 Occlusion and stenosis of bilateral carotid arteries: Secondary | ICD-10-CM | POA: Diagnosis not present

## 2016-05-31 DIAGNOSIS — M25562 Pain in left knee: Secondary | ICD-10-CM | POA: Diagnosis not present

## 2016-06-01 DIAGNOSIS — E559 Vitamin D deficiency, unspecified: Secondary | ICD-10-CM | POA: Diagnosis not present

## 2016-06-01 DIAGNOSIS — I10 Essential (primary) hypertension: Secondary | ICD-10-CM | POA: Diagnosis not present

## 2016-06-01 DIAGNOSIS — K219 Gastro-esophageal reflux disease without esophagitis: Secondary | ICD-10-CM | POA: Diagnosis not present

## 2016-06-01 DIAGNOSIS — E7800 Pure hypercholesterolemia, unspecified: Secondary | ICD-10-CM | POA: Diagnosis not present

## 2016-06-05 DIAGNOSIS — M25562 Pain in left knee: Secondary | ICD-10-CM | POA: Diagnosis not present

## 2016-06-07 DIAGNOSIS — M25562 Pain in left knee: Secondary | ICD-10-CM | POA: Diagnosis not present

## 2016-06-12 DIAGNOSIS — M25562 Pain in left knee: Secondary | ICD-10-CM | POA: Diagnosis not present

## 2016-06-14 DIAGNOSIS — M25562 Pain in left knee: Secondary | ICD-10-CM | POA: Diagnosis not present

## 2016-06-22 DIAGNOSIS — Z471 Aftercare following joint replacement surgery: Secondary | ICD-10-CM | POA: Diagnosis not present

## 2016-06-22 DIAGNOSIS — Z96652 Presence of left artificial knee joint: Secondary | ICD-10-CM | POA: Diagnosis not present

## 2016-06-22 DIAGNOSIS — G5622 Lesion of ulnar nerve, left upper limb: Secondary | ICD-10-CM | POA: Diagnosis not present

## 2016-08-04 DIAGNOSIS — G5622 Lesion of ulnar nerve, left upper limb: Secondary | ICD-10-CM | POA: Diagnosis not present

## 2016-08-21 DIAGNOSIS — G5622 Lesion of ulnar nerve, left upper limb: Secondary | ICD-10-CM | POA: Diagnosis not present

## 2016-08-25 DIAGNOSIS — G5622 Lesion of ulnar nerve, left upper limb: Secondary | ICD-10-CM | POA: Diagnosis not present

## 2016-11-17 ENCOUNTER — Encounter (HOSPITAL_COMMUNITY): Payer: Self-pay | Admitting: *Deleted

## 2016-11-17 ENCOUNTER — Ambulatory Visit (INDEPENDENT_AMBULATORY_CARE_PROVIDER_SITE_OTHER): Payer: Medicare Other

## 2016-11-17 ENCOUNTER — Ambulatory Visit (HOSPITAL_COMMUNITY)
Admission: EM | Admit: 2016-11-17 | Discharge: 2016-11-17 | Disposition: A | Discharge status: Home / Self Care | Payer: Medicare Other | Attending: Family Medicine | Admitting: Family Medicine

## 2016-11-17 DIAGNOSIS — S7001XA Contusion of right hip, initial encounter: Secondary | ICD-10-CM | POA: Diagnosis not present

## 2016-11-17 DIAGNOSIS — M545 Low back pain, unspecified: Secondary | ICD-10-CM

## 2016-11-17 DIAGNOSIS — W19XXXA Unspecified fall, initial encounter: Secondary | ICD-10-CM

## 2016-11-17 DIAGNOSIS — T148XXA Other injury of unspecified body region, initial encounter: Secondary | ICD-10-CM

## 2016-11-17 DIAGNOSIS — M25551 Pain in right hip: Secondary | ICD-10-CM | POA: Diagnosis not present

## 2016-11-17 DIAGNOSIS — S79911A Unspecified injury of right hip, initial encounter: Secondary | ICD-10-CM | POA: Diagnosis not present

## 2016-11-17 NOTE — Discharge Instructions (Signed)
Apply ice to the right buttock and right low back for the next couple of days. After that he may switch to heat. Apply heat to the right side of your neck is needed for comfort. He will be sore tomorrow and the next couple days. He x-ray of your hip and pelvis is normal. There is no evidence of any spinal injury. He may take Tylenol for pain as needed. For worsening, new symptoms or problems may return.

## 2016-11-17 NOTE — ED Provider Notes (Signed)
CSN: [id number]     Arrival date & time 11/17/16  1827 History   First MD Initiated Contact with Patient 11/17/16 1925     Chief Complaint  Patient presents with  . Fall   (Consider location/radiation/quality/duration/timing/severity/associated sxs/prior Treatment) 83 year old female accompanied by her husband states that she fell over an exercise ball approximately 4:30 PM. She fell backwards onto her right buttock. She is complaining of pain to the posterior right buttock with air is a "big knot". This area is tender. She is fully awake and alert, ambulatory and bearing full weight. Other complaints are minor soreness to the right lateral cervical musculature. Soreness to the right low back musculature. States she did not strike her head. No head pain, no problems with memory, concentration or cognition. Her speech is articulate and clear. Denies other injury.      Past Medical History:  Diagnosis Date  . Arthritis    some - per patient  . Cancer [facility])    breast cancer / left   . Cataract    bilat   . GERD (gastroesophageal reflux disease)   . History of bronchitis   . History of kidney stones   . Hypertension   . Hypothyroidism   . Nasal congestion   . Shortness of breath dyspnea    increased exertion   . Stress incontinence   . Thyroid disease   . Tinnitus   . Wears glasses    Past Surgical History:  Procedure Laterality Date  . ABDOMINAL HYSTERECTOMY  1970's  . BREAST LUMPECTOMY  12/1998   lumpectomy  . CARDIAC CATHETERIZATION    . EYE SURGERY     cataract surgery bilat   . LITHOTRIPSY    . THYROIDECTOMY, PARTIAL  1975  . TONSILLECTOMY     as a child - patient not sure of exact date  . TOTAL KNEE ARTHROPLASTY Left 03/13/2016   Procedure: TOTAL KNEE ARTHROPLASTY;  Surgeon: Paralee Cancel, MD;  Location: [facility] ORS;  Service: Orthopedics;  Laterality: Left;   Family History  Problem Relation Age of Onset  . Diabetes Mother   . Stroke Mother   . Heart disease Father     Social History  Substance Use Topics  . Smoking status: Never Smoker  . Smokeless tobacco: Never Used  . Alcohol use No   OB History    No data available     Review of Systems  Constitutional: Positive for activity change. Negative for fatigue.  HENT: Negative.   Respiratory: Negative.   Cardiovascular: Negative for chest pain and leg swelling.  Gastrointestinal: Negative.   Musculoskeletal: Positive for arthralgias, back pain, myalgias and neck pain. Negative for gait problem and neck stiffness.  Skin: Negative.   Neurological: Negative.  Negative for dizziness, seizures, facial asymmetry, speech difficulty and light-headedness.  All other systems reviewed and are negative.   Allergies  Penicillins and Sulfur  Home Medications   Prior to Admission medications   Medication Sig Start Date End Date Taking? Authorizing Provider  carvedilol (COREG) 6.25 MG tablet Take 6.25 mg by mouth 2 (two) times daily. 12/01/15   Historical Provider, MD  Cholecalciferol (VITAMIN D3) 2000 units TABS Take 2,000 Units by mouth daily.    Historical Provider, MD  hydrochlorothiazide (HYDRODIURIL) 25 MG tablet Take 25 mg by mouth daily. 01/06/16   Historical Provider, MD  levothyroxine (SYNTHROID, LEVOTHROID) 100 MCG tablet Take 100 mcg by mouth daily before breakfast. 12/29/15   Historical Provider, MD  losartan (COZAAR) 25 MG tablet  Take 25 mg by mouth daily.  01/02/13   Historical Provider, MD  omeprazole (PRILOSEC) 20 MG capsule Take 20 mg by mouth daily.    Historical Provider, MD  pravastatin (PRAVACHOL) 20 MG tablet Take 20 mg by mouth at bedtime. 12/29/15   Historical Provider, MD   Meds Ordered and Administered this Visit  Medications - No data to display  BP 162/70 (BP Location: Right Arm)   Pulse 80   Temp 98.6 F (37 C) (Oral)   Resp 18  No data found.   Physical Exam  Constitutional: She is oriented to person, place, and time. She appears well-developed and well-nourished. No  distress.  HENT:  Head: Normocephalic and atraumatic.  Nose: Nose normal.  Mouth/Throat: Oropharynx is clear and moist. No oropharyngeal exudate.  Eyes: Conjunctivae and EOM are normal. Pupils are equal, round, and reactive to light.  Neck: Normal range of motion. Neck supple.  Demonstrates full complete range of motion of the neck without limitation or pain. There is minor tenderness to the right paracervical musculature. No swelling, discoloration. There is no tenderness, deformity, discoloration to the C-spine.  Cardiovascular: Normal rate, regular rhythm, normal heart sounds and intact distal pulses.   No chest wall tenderness. No tenderness to the upper back or mid back.  Pulmonary/Chest: Effort normal and breath sounds normal. No respiratory distress. She has no wheezes. She has no rales.  Abdominal: Soft. There is no tenderness.  Musculoskeletal: Normal range of motion. She exhibits no edema or deformity.  Palpation of the right para reveals a hematoma. No current disc coloration. There is no tenderness to the lateral hip. The patient is able to bear weight on both the left and right lower extremity separately. Patient is able to stand on the left lower extremity and abduct her right hip without pain or limitation. No bony tenderness. No deformities. Patient examined also supine. Flexion and extension of the hip is intact. Rotation intact. The only pain elicited is the soft tissue of the posterior buttock and upper most thigh. There is tenderness to the musculature just superior to the iliac crest in the right lower back. No thoracic or lumbosacral spinal tenderness, swelling, deformity to percussion or palpation. She is able to flex and extend the spine normally.  Lymphadenopathy:    She has no cervical adenopathy.  Neurological: She is alert and oriented to person, place, and time. She displays normal reflexes. No cranial nerve deficit or sensory deficit. She exhibits normal muscle tone.  Coordination normal.  Skin: Skin is warm and dry. Capillary refill takes less than 2 seconds.  Psychiatric: She has a normal mood and affect. Her behavior is normal. Judgment and thought content normal.  Nursing note and vitals reviewed.   Urgent Care Course     Procedures (including critical care time)  Labs Review Labs Reviewed - No data to display  Imaging Review Dg Hip Unilat With Pelvis 2-3 Views Right  Result Date: 11/17/2016 CLINICAL DATA:  83 y/o  F; fall on right side with right hip pain. EXAM: DG HIP (WITH OR WITHOUT PELVIS) 2-3V RIGHT COMPARISON:  None. FINDINGS: There is no evidence of hip fracture or dislocation. There is no evidence of arthropathy or other focal bone abnormality. IMPRESSION: Negative. Electronically Signed   By: Kristine Garbe M.D.   On: 11/17/2016 20:10     Visual Acuity Review  Right Eye Distance:   Left Eye Distance:   Bilateral Distance:    Right Eye Near:   Left  Eye Near:    Bilateral Near:         MDM   1. Fall, initial encounter   2. Fall   3. Contusion of right hip, initial encounter   4. Muscle strain   5. Acute right-sided low back pain without sciatica    Apply ice to the right buttock and right low back for the next couple of days. After that he may switch to heat. Apply heat to the right side of your neck is needed for comfort. He will be sore tomorrow and the next couple days. He x-ray of your hip and pelvis is normal. There is no evidence of any spinal injury. He may take Tylenol for pain as needed. For worsening, new symptoms or problems may return.     Janne Napoleon, NP 11/17/16 2033

## 2016-11-17 NOTE — ED Triage Notes (Signed)
Pt     Stumbled  A  [employer]  Backwards       Carpeted  Floor      Pain  r   Buttock  And  Lower    Back       Also  Has  Some    Neck  Pain          Pt   Is  Able  To         Walk            Pain    Is   Mild   When  She  Ambulates

## 2016-11-20 DIAGNOSIS — Z23 Encounter for immunization: Secondary | ICD-10-CM | POA: Diagnosis not present

## 2016-11-23 DIAGNOSIS — E559 Vitamin D deficiency, unspecified: Secondary | ICD-10-CM | POA: Diagnosis not present

## 2016-11-23 DIAGNOSIS — I10 Essential (primary) hypertension: Secondary | ICD-10-CM | POA: Diagnosis not present

## 2016-11-29 DIAGNOSIS — E039 Hypothyroidism, unspecified: Secondary | ICD-10-CM | POA: Diagnosis not present

## 2016-11-29 DIAGNOSIS — E7800 Pure hypercholesterolemia, unspecified: Secondary | ICD-10-CM | POA: Diagnosis not present

## 2016-11-29 DIAGNOSIS — M545 Low back pain: Secondary | ICD-10-CM | POA: Diagnosis not present

## 2016-11-29 DIAGNOSIS — Z0000 Encounter for general adult medical examination without abnormal findings: Secondary | ICD-10-CM | POA: Diagnosis not present

## 2016-11-29 DIAGNOSIS — I10 Essential (primary) hypertension: Secondary | ICD-10-CM | POA: Diagnosis not present

## 2016-12-07 DIAGNOSIS — M549 Dorsalgia, unspecified: Secondary | ICD-10-CM | POA: Diagnosis not present

## 2016-12-07 DIAGNOSIS — M5136 Other intervertebral disc degeneration, lumbar region: Secondary | ICD-10-CM | POA: Diagnosis not present

## 2016-12-07 DIAGNOSIS — M4316 Spondylolisthesis, lumbar region: Secondary | ICD-10-CM | POA: Diagnosis not present

## 2016-12-07 DIAGNOSIS — M47816 Spondylosis without myelopathy or radiculopathy, lumbar region: Secondary | ICD-10-CM | POA: Diagnosis not present

## 2016-12-07 DIAGNOSIS — S22080A Wedge compression fracture of T11-T12 vertebra, initial encounter for closed fracture: Secondary | ICD-10-CM | POA: Diagnosis not present

## 2016-12-09 DIAGNOSIS — S22080A Wedge compression fracture of T11-T12 vertebra, initial encounter for closed fracture: Secondary | ICD-10-CM | POA: Diagnosis not present

## 2016-12-09 DIAGNOSIS — M4316 Spondylolisthesis, lumbar region: Secondary | ICD-10-CM | POA: Diagnosis not present

## 2016-12-09 DIAGNOSIS — M5124 Other intervertebral disc displacement, thoracic region: Secondary | ICD-10-CM | POA: Diagnosis not present

## 2016-12-09 DIAGNOSIS — M549 Dorsalgia, unspecified: Secondary | ICD-10-CM | POA: Diagnosis not present

## 2016-12-11 DIAGNOSIS — M545 Low back pain: Secondary | ICD-10-CM | POA: Diagnosis not present

## 2016-12-11 DIAGNOSIS — M47816 Spondylosis without myelopathy or radiculopathy, lumbar region: Secondary | ICD-10-CM | POA: Diagnosis not present

## 2016-12-11 DIAGNOSIS — M4316 Spondylolisthesis, lumbar region: Secondary | ICD-10-CM | POA: Diagnosis not present

## 2016-12-11 DIAGNOSIS — S22080A Wedge compression fracture of T11-T12 vertebra, initial encounter for closed fracture: Secondary | ICD-10-CM | POA: Diagnosis not present

## 2016-12-11 DIAGNOSIS — M5136 Other intervertebral disc degeneration, lumbar region: Secondary | ICD-10-CM | POA: Diagnosis not present

## 2016-12-15 ENCOUNTER — Other Ambulatory Visit: Payer: Self-pay | Admitting: Internal Medicine

## 2016-12-15 ENCOUNTER — Other Ambulatory Visit: Payer: Self-pay | Admitting: Surgery

## 2016-12-15 DIAGNOSIS — Z1231 Encounter for screening mammogram for malignant neoplasm of breast: Secondary | ICD-10-CM

## 2016-12-26 DIAGNOSIS — M5136 Other intervertebral disc degeneration, lumbar region: Secondary | ICD-10-CM | POA: Diagnosis not present

## 2016-12-26 DIAGNOSIS — M545 Low back pain: Secondary | ICD-10-CM | POA: Diagnosis not present

## 2016-12-26 DIAGNOSIS — S22080A Wedge compression fracture of T11-T12 vertebra, initial encounter for closed fracture: Secondary | ICD-10-CM | POA: Diagnosis not present

## 2016-12-26 DIAGNOSIS — M4316 Spondylolisthesis, lumbar region: Secondary | ICD-10-CM | POA: Diagnosis not present

## 2016-12-26 DIAGNOSIS — M47816 Spondylosis without myelopathy or radiculopathy, lumbar region: Secondary | ICD-10-CM | POA: Diagnosis not present

## 2017-01-03 DIAGNOSIS — S22000A Wedge compression fracture of unspecified thoracic vertebra, initial encounter for closed fracture: Secondary | ICD-10-CM | POA: Diagnosis not present

## 2017-01-03 DIAGNOSIS — M549 Dorsalgia, unspecified: Secondary | ICD-10-CM | POA: Diagnosis not present

## 2017-01-03 DIAGNOSIS — S22080A Wedge compression fracture of T11-T12 vertebra, initial encounter for closed fracture: Secondary | ICD-10-CM | POA: Diagnosis not present

## 2017-01-26 ENCOUNTER — Ambulatory Visit
Admission: RE | Admit: 2017-01-26 | Discharge: 2017-01-26 | Disposition: A | Discharge status: Home / Self Care | Payer: Medicare Other | Source: Ambulatory Visit | Attending: Surgery | Admitting: Surgery

## 2017-01-26 DIAGNOSIS — Z1231 Encounter for screening mammogram for malignant neoplasm of breast: Secondary | ICD-10-CM

## 2017-01-26 DIAGNOSIS — S22080A Wedge compression fracture of T11-T12 vertebra, initial encounter for closed fracture: Secondary | ICD-10-CM | POA: Diagnosis not present

## 2017-02-01 DIAGNOSIS — Z853 Personal history of malignant neoplasm of breast: Secondary | ICD-10-CM | POA: Diagnosis not present

## 2017-03-14 DIAGNOSIS — M4316 Spondylolisthesis, lumbar region: Secondary | ICD-10-CM | POA: Diagnosis not present

## 2017-03-14 DIAGNOSIS — M5136 Other intervertebral disc degeneration, lumbar region: Secondary | ICD-10-CM | POA: Diagnosis not present

## 2017-03-14 DIAGNOSIS — M546 Pain in thoracic spine: Secondary | ICD-10-CM | POA: Diagnosis not present

## 2017-03-14 DIAGNOSIS — S22080A Wedge compression fracture of T11-T12 vertebra, initial encounter for closed fracture: Secondary | ICD-10-CM | POA: Diagnosis not present

## 2017-03-14 DIAGNOSIS — M47816 Spondylosis without myelopathy or radiculopathy, lumbar region: Secondary | ICD-10-CM | POA: Diagnosis not present

## 2017-03-20 DIAGNOSIS — M5124 Other intervertebral disc displacement, thoracic region: Secondary | ICD-10-CM | POA: Diagnosis not present

## 2017-03-20 DIAGNOSIS — M545 Low back pain: Secondary | ICD-10-CM | POA: Diagnosis not present

## 2017-03-20 DIAGNOSIS — S22080A Wedge compression fracture of T11-T12 vertebra, initial encounter for closed fracture: Secondary | ICD-10-CM | POA: Diagnosis not present

## 2017-03-20 DIAGNOSIS — M47816 Spondylosis without myelopathy or radiculopathy, lumbar region: Secondary | ICD-10-CM | POA: Diagnosis not present

## 2017-03-21 DIAGNOSIS — H524 Presbyopia: Secondary | ICD-10-CM | POA: Diagnosis not present

## 2017-03-28 DIAGNOSIS — S32030A Wedge compression fracture of third lumbar vertebra, initial encounter for closed fracture: Secondary | ICD-10-CM | POA: Diagnosis not present

## 2017-03-28 DIAGNOSIS — M47816 Spondylosis without myelopathy or radiculopathy, lumbar region: Secondary | ICD-10-CM | POA: Diagnosis not present

## 2017-03-28 DIAGNOSIS — M545 Low back pain: Secondary | ICD-10-CM | POA: Diagnosis not present

## 2017-03-28 DIAGNOSIS — M5136 Other intervertebral disc degeneration, lumbar region: Secondary | ICD-10-CM | POA: Diagnosis not present

## 2017-03-28 DIAGNOSIS — M4316 Spondylolisthesis, lumbar region: Secondary | ICD-10-CM | POA: Diagnosis not present

## 2017-04-06 DIAGNOSIS — Z961 Presence of intraocular lens: Secondary | ICD-10-CM | POA: Diagnosis not present

## 2017-04-06 DIAGNOSIS — H35372 Puckering of macula, left eye: Secondary | ICD-10-CM | POA: Diagnosis not present

## 2017-04-06 DIAGNOSIS — H353111 Nonexudative age-related macular degeneration, right eye, early dry stage: Secondary | ICD-10-CM | POA: Diagnosis not present

## 2017-04-06 DIAGNOSIS — H26493 Other secondary cataract, bilateral: Secondary | ICD-10-CM | POA: Diagnosis not present

## 2017-04-06 DIAGNOSIS — H353122 Nonexudative age-related macular degeneration, left eye, intermediate dry stage: Secondary | ICD-10-CM | POA: Diagnosis not present

## 2017-04-10 DIAGNOSIS — M4316 Spondylolisthesis, lumbar region: Secondary | ICD-10-CM | POA: Diagnosis not present

## 2017-04-10 DIAGNOSIS — M47816 Spondylosis without myelopathy or radiculopathy, lumbar region: Secondary | ICD-10-CM | POA: Diagnosis not present

## 2017-04-10 DIAGNOSIS — M545 Low back pain: Secondary | ICD-10-CM | POA: Diagnosis not present

## 2017-04-10 DIAGNOSIS — S32030A Wedge compression fracture of third lumbar vertebra, initial encounter for closed fracture: Secondary | ICD-10-CM | POA: Diagnosis not present

## 2017-04-10 DIAGNOSIS — M5136 Other intervertebral disc degeneration, lumbar region: Secondary | ICD-10-CM | POA: Diagnosis not present

## 2017-04-23 ENCOUNTER — Encounter (INDEPENDENT_AMBULATORY_CARE_PROVIDER_SITE_OTHER): Payer: 59 | Admitting: Ophthalmology

## 2017-04-23 DIAGNOSIS — I10 Essential (primary) hypertension: Secondary | ICD-10-CM

## 2017-04-23 DIAGNOSIS — H35033 Hypertensive retinopathy, bilateral: Secondary | ICD-10-CM | POA: Diagnosis not present

## 2017-04-23 DIAGNOSIS — H43813 Vitreous degeneration, bilateral: Secondary | ICD-10-CM | POA: Diagnosis not present

## 2017-04-23 DIAGNOSIS — H35372 Puckering of macula, left eye: Secondary | ICD-10-CM

## 2017-04-23 DIAGNOSIS — H353132 Nonexudative age-related macular degeneration, bilateral, intermediate dry stage: Secondary | ICD-10-CM

## 2017-05-01 DIAGNOSIS — R011 Cardiac murmur, unspecified: Secondary | ICD-10-CM | POA: Diagnosis not present

## 2017-05-01 DIAGNOSIS — Z8679 Personal history of other diseases of the circulatory system: Secondary | ICD-10-CM | POA: Diagnosis not present

## 2017-05-23 DIAGNOSIS — M1711 Unilateral primary osteoarthritis, right knee: Secondary | ICD-10-CM | POA: Diagnosis not present

## 2017-05-23 DIAGNOSIS — Z96652 Presence of left artificial knee joint: Secondary | ICD-10-CM | POA: Diagnosis not present

## 2017-05-23 DIAGNOSIS — M25561 Pain in right knee: Secondary | ICD-10-CM | POA: Diagnosis not present

## 2017-05-23 DIAGNOSIS — Z471 Aftercare following joint replacement surgery: Secondary | ICD-10-CM | POA: Diagnosis not present

## 2017-05-29 DIAGNOSIS — M810 Age-related osteoporosis without current pathological fracture: Secondary | ICD-10-CM | POA: Diagnosis not present

## 2017-05-29 DIAGNOSIS — Z0000 Encounter for general adult medical examination without abnormal findings: Secondary | ICD-10-CM | POA: Diagnosis not present

## 2017-05-29 DIAGNOSIS — N390 Urinary tract infection, site not specified: Secondary | ICD-10-CM | POA: Diagnosis not present

## 2017-05-29 DIAGNOSIS — E039 Hypothyroidism, unspecified: Secondary | ICD-10-CM | POA: Diagnosis not present

## 2017-05-29 DIAGNOSIS — E559 Vitamin D deficiency, unspecified: Secondary | ICD-10-CM | POA: Diagnosis not present

## 2017-05-29 DIAGNOSIS — I10 Essential (primary) hypertension: Secondary | ICD-10-CM | POA: Diagnosis not present

## 2017-05-31 DIAGNOSIS — I6522 Occlusion and stenosis of left carotid artery: Secondary | ICD-10-CM | POA: Diagnosis not present

## 2017-06-05 DIAGNOSIS — Z23 Encounter for immunization: Secondary | ICD-10-CM | POA: Diagnosis not present

## 2017-06-05 DIAGNOSIS — E039 Hypothyroidism, unspecified: Secondary | ICD-10-CM | POA: Diagnosis not present

## 2017-06-05 DIAGNOSIS — E559 Vitamin D deficiency, unspecified: Secondary | ICD-10-CM | POA: Diagnosis not present

## 2017-06-05 DIAGNOSIS — Z0000 Encounter for general adult medical examination without abnormal findings: Secondary | ICD-10-CM | POA: Diagnosis not present

## 2017-06-05 DIAGNOSIS — I10 Essential (primary) hypertension: Secondary | ICD-10-CM | POA: Diagnosis not present

## 2017-06-08 NOTE — Patient Instructions (Signed)
[name]  06/08/2017   Your procedure is scheduled on:06/18/17   Report to [facility]  Entrance   Report to admitting at     920 AM   Call this number if you have problems the morning of surgery    [phone]   Remember: ONLY 1 PERSON MAY GO WITH YOU TO SHORT STAY TO GET  READY MORNING OF [place].  Do not eat food or drink liquids :After Midnight.     Take these medicines the morning of surgery with A SIP OF WATER: omeprazole, levothyroxine, carvedilol                                You may not have any metal on your body including hair pins and              piercings  Do not wear jewelry, make-up, lotions, powders or perfumes, deodorant             Do not wear nail polish.  Do not shave  48 hours prior to surgery.               Do not bring valuables to the hospital. [place].  Contacts, dentures or bridgework may not be worn into surgery.  Leave suitcase in the car. After surgery it may be brought to your room.                 Please read over the following fact sheets you were given: _____________________________________________________________________          [facility] - Preparing for Surgery Before surgery, you can play an important role.  Because skin is not sterile, your skin needs to be as free of germs as possible.  You can reduce the number of germs on your skin by washing with CHG (chlorahexidine gluconate) soap before surgery.  CHG is an antiseptic cleaner which kills germs and bonds with the skin to continue killing germs even after washing. Please DO NOT use if you have an allergy to CHG or antibacterial soaps.  If your skin becomes reddened/irritated stop using the CHG and inform your nurse when you arrive at Short Stay. Do not shave (including legs and underarms) for at least 48 hours prior to the first CHG shower.  You may shave your face/neck. Please follow these  instructions carefully:  1.  Shower with CHG Soap the night before surgery and the  morning of Surgery.  2.  If you choose to wash your hair, wash your hair first as usual with your  normal  shampoo.  3.  After you shampoo, rinse your hair and body thoroughly to remove the  shampoo.                           4.  Use CHG as you would any other liquid soap.  You can apply chg directly  to the skin and wash                       Gently with a scrungie or clean washcloth.  5.  Apply the CHG Soap to your body ONLY FROM THE NECK  DOWN.   Do not use on face/ open                           Wound or open sores. Avoid contact with eyes, ears mouth and genitals (private parts).                       Wash face,  Genitals (private parts) with your normal soap.             6.  Wash thoroughly, paying special attention to the area where your surgery  will be performed.  7.  Thoroughly rinse your body with warm water from the neck down.  8.  DO NOT shower/wash with your normal soap after using and rinsing off  the CHG Soap.                9.  Pat yourself dry with a clean towel.            10.  Wear clean pajamas.            11.  Place clean sheets on your bed the night of your first shower and do not  sleep with pets. Day of Surgery : Do not apply any lotions/deodorants the morning of surgery.  Please wear clean clothes to the hospital/surgery center.  FAILURE TO FOLLOW THESE INSTRUCTIONS MAY RESULT IN THE CANCELLATION OF YOUR SURGERY PATIENT SIGNATURE_________________________________  NURSE SIGNATURE__________________________________  ________________________________________________________________________  WHAT IS A BLOOD TRANSFUSION? Blood Transfusion Information  A transfusion is the replacement of blood or some of its parts. Blood is made up of multiple cells which provide different functions.  Red blood cells carry oxygen and are used for blood loss replacement.  White blood cells fight against  infection.  Platelets control bleeding.  Plasma helps clot blood.  Other blood products are available for specialized needs, such as hemophilia or other clotting disorders. BEFORE THE TRANSFUSION  Who gives blood for transfusions?   Healthy volunteers who are fully evaluated to make sure their blood is safe. This is blood bank blood. Transfusion therapy is the safest it has ever been in the practice of medicine. Before blood is taken from a donor, a complete history is taken to make sure that person has no history of diseases nor engages in risky social behavior (examples are intravenous drug use or sexual activity with multiple partners). The donor's travel history is screened to minimize risk of transmitting infections, such as malaria. The donated blood is tested for signs of infectious diseases, such as HIV and hepatitis. The blood is then tested to be sure it is compatible with you in order to minimize the chance of a transfusion reaction. If you or a relative donates blood, this is often done in anticipation of surgery and is not appropriate for emergency situations. It takes many days to process the donated blood. RISKS AND COMPLICATIONS Although transfusion therapy is very safe and saves many lives, the main dangers of transfusion include:   Getting an infectious disease.  Developing a transfusion reaction. This is an allergic reaction to something in the blood you were given. Every precaution is taken to prevent this. The decision to have a blood transfusion has been considered carefully by your caregiver before blood is given. Blood is not given unless the benefits outweigh the risks. AFTER THE TRANSFUSION  Right after receiving a blood transfusion, you will usually feel much better and more energetic.  This is especially true if your red blood cells have gotten low (anemic). The transfusion raises the level of the red blood cells which carry oxygen, and this usually causes an energy  increase.  The nurse administering the transfusion will monitor you carefully for complications. HOME CARE INSTRUCTIONS  No special instructions are needed after a transfusion. You may find your energy is better. Speak with your caregiver about any limitations on activity for underlying diseases you may have. SEEK MEDICAL CARE IF:   Your condition is not improving after your transfusion.  You develop redness or irritation at the intravenous (IV) site. SEEK IMMEDIATE MEDICAL CARE IF:  Any of the following symptoms occur over the next 12 hours:  Shaking chills.  You have a temperature by mouth above 102 F (38.9 C), not controlled by medicine.  Chest, back, or muscle pain.  People around you feel you are not acting correctly or are confused.  Shortness of breath or difficulty breathing.  Dizziness and fainting.  You get a rash or develop hives.  You have a decrease in urine output.  Your urine turns a dark color or changes to pink, red, or brown. Any of the following symptoms occur over the next 10 days:  You have a temperature by mouth above 102 F (38.9 C), not controlled by medicine.  Shortness of breath.  Weakness after normal activity.  The white part of the eye turns yellow (jaundice).  You have a decrease in the amount of urine or are urinating less often.  Your urine turns a dark color or changes to pink, red, or brown. Document Released: 10/06/2000 Document Revised: 01/01/2012 Document Reviewed: 05/25/2008 ExitCare Patient Information 2014 [place].  _______________________________________________________________________  Incentive Spirometer  An incentive spirometer is a tool that can help keep your lungs clear and active. This tool measures how well you are filling your lungs with each breath. Taking long deep breaths may help reverse or decrease the chance of developing breathing (pulmonary) problems (especially infection) following:  A long  period of time when you are unable to move or be active. BEFORE THE PROCEDURE   If the spirometer includes an indicator to show your best effort, your nurse or respiratory therapist will set it to a desired goal.  If possible, sit up straight or lean slightly forward. Try not to slouch.  Hold the incentive spirometer in an upright position. INSTRUCTIONS FOR USE  1. Sit on the edge of your bed if possible, or sit up as far as you can in bed or on a chair. 2. Hold the incentive spirometer in an upright position. 3. Breathe out normally. 4. Place the mouthpiece in your mouth and seal your lips tightly around it. 5. Breathe in slowly and as deeply as possible, raising the piston or the ball toward the top of the column. 6. Hold your breath for 3-5 seconds or for as long as possible. Allow the piston or ball to fall to the bottom of the column. 7. Remove the mouthpiece from your mouth and breathe out normally. 8. Rest for a few seconds and repeat Steps 1 through 7 at least 10 times every 1-2 hours when you are awake. Take your time and take a few normal breaths between deep breaths. 9. The spirometer may include an indicator to show your best effort. Use the indicator as a goal to work toward during each repetition. 10. After each set of 10 deep breaths, practice coughing to be sure your lungs are clear.  If you have an incision (the cut made at the time of surgery), support your incision when coughing by placing a pillow or rolled up towels firmly against it. Once you are able to get out of bed, walk around indoors and cough well. You may stop using the incentive spirometer when instructed by your caregiver.  RISKS AND COMPLICATIONS  Take your time so you do not get dizzy or light-headed.  If you are in pain, you may need to take or ask for pain medication before doing incentive spirometry. It is harder to take a deep breath if you are having pain. AFTER USE  Rest and breathe slowly and  easily.  It can be helpful to keep track of a log of your progress. Your caregiver can provide you with a simple table to help with this. If you are using the spirometer at home, follow these instructions: [place] IF:   You are having difficultly using the spirometer.  You have trouble using the spirometer as often as instructed.  Your pain medication is not giving enough relief while using the spirometer.  You develop fever of 100.5 F (38.1 C) or higher. SEEK IMMEDIATE MEDICAL CARE IF:   You cough up bloody sputum that had not been present before.  You develop fever of 102 F (38.9 C) or greater.  You develop worsening pain at or near the incision site. MAKE SURE YOU:   Understand these instructions.  Will watch your condition.  Will get help right away if you are not doing well or get worse. Document Released: 02/19/2007 Document Revised: 01/01/2012 Document Reviewed: 04/22/2007 [facility] Patient Information 2014 [place], [place].   ________________________________________________________________________

## 2017-06-11 ENCOUNTER — Encounter (HOSPITAL_COMMUNITY): Payer: Self-pay

## 2017-06-11 ENCOUNTER — Encounter (HOSPITAL_COMMUNITY)
Admission: RE | Admit: 2017-06-11 | Discharge: 2017-06-11 | Disposition: A | Discharge status: Home / Self Care | Payer: Medicare Other | Source: Ambulatory Visit | Attending: Orthopedic Surgery | Admitting: Orthopedic Surgery

## 2017-06-11 DIAGNOSIS — Z01818 Encounter for other preprocedural examination: Secondary | ICD-10-CM | POA: Insufficient documentation

## 2017-06-11 DIAGNOSIS — M1711 Unilateral primary osteoarthritis, right knee: Secondary | ICD-10-CM | POA: Diagnosis not present

## 2017-06-11 HISTORY — DX: Unspecified macular degeneration: H35.30

## 2017-06-11 LAB — SURGICAL PCR SCREEN
MRSA, PCR: NEGATIVE
STAPHYLOCOCCUS AUREUS: NEGATIVE

## 2017-06-11 LAB — CBC
HEMATOCRIT: 36.1 % (ref 36.0–46.0)
HEMOGLOBIN: 11.7 g/dL — AB (ref 12.0–15.0)
MCH: 27.7 pg (ref 26.0–34.0)
MCHC: 32.4 g/dL (ref 30.0–36.0)
MCV: 85.5 fL (ref 78.0–100.0)
Platelets: 219 10*3/uL (ref 150–400)
RBC: 4.22 MIL/uL (ref 3.87–5.11)
RDW: 14.5 % (ref 11.5–15.5)
WBC: 8.1 10*3/uL (ref 4.0–10.5)

## 2017-06-11 LAB — BASIC METABOLIC PANEL
ANION GAP: 7 (ref 5–15)
BUN: 16 mg/dL (ref 6–20)
CO2: 27 mmol/L (ref 22–32)
Calcium: 8.7 mg/dL — ABNORMAL LOW (ref 8.9–10.3)
Chloride: 100 mmol/L — ABNORMAL LOW (ref 101–111)
Creatinine, Ser: 0.65 mg/dL (ref 0.44–1.00)
GLUCOSE: 93 mg/dL (ref 65–99)
POTASSIUM: 4.1 mmol/L (ref 3.5–5.1)
Sodium: 134 mmol/L — ABNORMAL LOW (ref 135–145)

## 2017-06-11 NOTE — Progress Notes (Signed)
Dr. Ambrose Pancoast anesthesia made aware of Echo showing moderate stenosis. Echo report on chart.

## 2017-06-12 DIAGNOSIS — S32030A Wedge compression fracture of third lumbar vertebra, initial encounter for closed fracture: Secondary | ICD-10-CM | POA: Diagnosis not present

## 2017-06-12 DIAGNOSIS — M5136 Other intervertebral disc degeneration, lumbar region: Secondary | ICD-10-CM | POA: Diagnosis not present

## 2017-06-12 DIAGNOSIS — M47816 Spondylosis without myelopathy or radiculopathy, lumbar region: Secondary | ICD-10-CM | POA: Diagnosis not present

## 2017-06-12 DIAGNOSIS — M4316 Spondylolisthesis, lumbar region: Secondary | ICD-10-CM | POA: Diagnosis not present

## 2017-06-13 NOTE — Progress Notes (Signed)
Talked to patient on the phone. Informed her of surgrey time change and to check in to admitting at 0700 am . Pt. VU

## 2017-06-15 NOTE — H&P (Signed)
TOTAL KNEE ADMISSION H&P  Patient is being admitted for right total knee arthroplasty.  Subjective:  Chief Complaint:  Right knee primary OA / pain  HPI: [name], 83 y.o. female, has a history of pain and functional disability in the right knee due to arthritis and has failed non-surgical conservative treatments for greater than 12 weeks to include NSAID's and/or analgesics, use of assistive devices and activity modification.  Onset of symptoms was gradual, starting 2+ years ago with gradually worsening course since that time. The patient noted prior procedures on the knee to include  arthroscopy and menisectomy on the right knee(s).  Patient currently rates pain in the right knee(s) at 8 out of 10 with activity. Patient has worsening of pain with activity and weight bearing, pain that interferes with activities of daily living, pain with passive range of motion, crepitus and joint swelling.  Patient has evidence of periarticular osteophytes and joint space narrowing by imaging studies.  There is no active infection.  Risks, benefits and expectations were discussed with the patient.  Risks including but not limited to the risk of anesthesia, blood clots, nerve damage, blood vessel damage, failure of the prosthesis, infection and up to and including death.  Patient understand the risks, benefits and expectations and wishes to proceed with surgery.   PCP: Jani Gravel, MD  D/C Plans:      SNF - [facility]  Post-op Meds:       No Rx given  Tranexamic Acid:      To be given - IV   Decadron:      Is to be given  FYI:     ASA  Norco  DME:   Pt already has equipment  PT: SNF  Patient Active Problem List   Diagnosis Date Noted  . Obese 03/15/2016  . S/P left TKA 03/13/2016  . History of breast cancer, DCIS, lumpectomy March 2000 12/13/2011   Past Medical History:  Diagnosis Date  . Arthritis    some - per patient  . Cancer [facility]    breast cancer / left   . Cataract    bilat    . GERD (gastroesophageal reflux disease)   . Heart murmur   . History of bronchitis   . History of kidney stones   . Hypertension   . Hypothyroidism   . Macular degeneration    Left  . Nasal congestion   . Shortness of breath dyspnea    increased exertion   . Stress incontinence   . Thyroid disease   . Tinnitus   . Wears glasses     Past Surgical History:  Procedure Laterality Date  . ABDOMINAL HYSTERECTOMY  1970's  . BREAST LUMPECTOMY  12/1998   lumpectomy  . CARDIAC CATHETERIZATION    . EYE SURGERY     cataract surgery bilat   . LITHOTRIPSY    . Right total knee     2018 Dr. Alvan Dame  . THYROIDECTOMY, PARTIAL  1975  . TONSILLECTOMY     as a child - patient not sure of exact date  . TOTAL KNEE ARTHROPLASTY Left 03/13/2016   Procedure: TOTAL KNEE ARTHROPLASTY;  Surgeon: Paralee Cancel, MD;  Location: [facility] ORS;  Service: Orthopedics;  Laterality: Left;    No prescriptions prior to admission.   Allergies  Allergen Reactions  . Penicillins Other (See Comments)    Patient does not remember reaction. Happened many years ago. Has patient had a PCN reaction causing immediate rash, facial/tongue/throat swelling, SOB  or lightheadedness with hypotension: no Has patient had a PCN reaction causing severe rash involving mucus membranes or skin necrosis: no Has patient had a PCN reaction that required hospitalization no Has patient had a PCN reaction occurring within the last 10 years: no If all of the above answers are "NO", then may proceed with Cephalosporin use.   Marland Kitchen Sulfur Other (See Comments)    Alters vision    Social History  Substance Use Topics  . Smoking status: Never Smoker  . Smokeless tobacco: Never Used  . Alcohol use No    Family History  Problem Relation Age of Onset  . Diabetes Mother   . Stroke Mother   . Heart disease Father      Review of Systems  Constitutional: Negative.   HENT: Positive for tinnitus.   Eyes: Negative.   Respiratory: Positive for  shortness of breath (with exertion).   Cardiovascular: Negative.   Gastrointestinal: Positive for heartburn.  Genitourinary: Negative.   Musculoskeletal: Positive for joint pain.  Skin: Negative.   Neurological: Negative.   Endo/Heme/Allergies: Negative.   Psychiatric/Behavioral: Negative.     Objective:  Physical Exam  Constitutional: She is oriented to person, place, and time. She appears well-developed.  HENT:  Head: Normocephalic.  Eyes: Pupils are equal, round, and reactive to light.  Neck: Neck supple. No JVD present. No tracheal deviation present. No thyromegaly present.  Cardiovascular: Normal rate, regular rhythm and intact distal pulses.   Murmur heard. Respiratory: Effort normal and breath sounds normal. No respiratory distress. She has no wheezes.  GI: Soft. There is no tenderness. There is no guarding.  Musculoskeletal:       Right knee: She exhibits decreased range of motion, swelling and bony tenderness. She exhibits no ecchymosis, no deformity, no laceration and no erythema. Tenderness found.  Lymphadenopathy:    She has no cervical adenopathy.  Neurological: She is alert and oriented to person, place, and time.  Skin: Skin is warm and dry.  Psychiatric: She has a normal mood and affect.      Labs:  Estimated body mass index is 30.73 kg/m as calculated from the following:   Height as of 06/11/17: 5\' 4"  (1.626 m).   Weight as of 06/11/17: 81.2 kg (179 lb).   Imaging Review Plain radiographs demonstrate severe degenerative joint disease of the right knee(s). The bone quality appears to be good for age and reported activity level.  Assessment/Plan:  End stage arthritis, right knee   The patient history, physical examination, clinical judgment of the provider and imaging studies are consistent with end stage degenerative joint disease of the right knee(s) and total knee arthroplasty is deemed medically necessary. The treatment options including medical  management, injection therapy arthroscopy and arthroplasty were discussed at length. The risks and benefits of total knee arthroplasty were presented and reviewed. The risks due to aseptic loosening, infection, stiffness, patella tracking problems, thromboembolic complications and other imponderables were discussed. The patient acknowledged the explanation, agreed to proceed with the plan and consent was signed. Patient is being admitted for inpatient treatment for surgery, pain control, PT, OT, prophylactic antibiotics, VTE prophylaxis, progressive ambulation and ADL's and discharge planning. The patient is planning to be discharged to skilled nursing facility.      [place] Pugh Neziah Vogelgesang   PA-C  06/15/2017, 7:55 AM

## 2017-06-18 ENCOUNTER — Ambulatory Visit (HOSPITAL_COMMUNITY): Payer: Medicare Other | Admitting: Anesthesiology

## 2017-06-18 ENCOUNTER — Encounter (HOSPITAL_COMMUNITY): Payer: Self-pay | Admitting: *Deleted

## 2017-06-18 ENCOUNTER — Encounter (HOSPITAL_COMMUNITY)
Admission: AD | Disposition: A | Discharge status: Skilled Nursing Facility | Payer: Self-pay | Source: Ambulatory Visit | Attending: Orthopedic Surgery

## 2017-06-18 ENCOUNTER — Inpatient Hospital Stay (HOSPITAL_COMMUNITY)
Admission: AD | Admit: 2017-06-18 | Discharge: 2017-06-20 | DRG: 470 | Disposition: A | Discharge status: Skilled Nursing Facility | Payer: Medicare Other | Source: Ambulatory Visit | Attending: Orthopedic Surgery | Admitting: Orthopedic Surgery

## 2017-06-18 DIAGNOSIS — H269 Unspecified cataract: Secondary | ICD-10-CM | POA: Diagnosis not present

## 2017-06-18 DIAGNOSIS — Z87442 Personal history of urinary calculi: Secondary | ICD-10-CM | POA: Diagnosis not present

## 2017-06-18 DIAGNOSIS — M1711 Unilateral primary osteoarthritis, right knee: Principal | ICD-10-CM | POA: Diagnosis present

## 2017-06-18 DIAGNOSIS — M659 Synovitis and tenosynovitis, unspecified: Secondary | ICD-10-CM | POA: Diagnosis present

## 2017-06-18 DIAGNOSIS — K219 Gastro-esophageal reflux disease without esophagitis: Secondary | ICD-10-CM | POA: Diagnosis present

## 2017-06-18 DIAGNOSIS — Z90710 Acquired absence of both cervix and uterus: Secondary | ICD-10-CM | POA: Diagnosis not present

## 2017-06-18 DIAGNOSIS — Z96652 Presence of left artificial knee joint: Secondary | ICD-10-CM | POA: Diagnosis present

## 2017-06-18 DIAGNOSIS — I10 Essential (primary) hypertension: Secondary | ICD-10-CM | POA: Diagnosis not present

## 2017-06-18 DIAGNOSIS — H3530 Unspecified macular degeneration: Secondary | ICD-10-CM | POA: Diagnosis present

## 2017-06-18 DIAGNOSIS — Z833 Family history of diabetes mellitus: Secondary | ICD-10-CM

## 2017-06-18 DIAGNOSIS — E039 Hypothyroidism, unspecified: Secondary | ICD-10-CM | POA: Diagnosis not present

## 2017-06-18 DIAGNOSIS — Z96651 Presence of right artificial knee joint: Secondary | ICD-10-CM | POA: Diagnosis not present

## 2017-06-18 DIAGNOSIS — Z471 Aftercare following joint replacement surgery: Secondary | ICD-10-CM | POA: Diagnosis not present

## 2017-06-18 DIAGNOSIS — R011 Cardiac murmur, unspecified: Secondary | ICD-10-CM | POA: Diagnosis not present

## 2017-06-18 DIAGNOSIS — Z8249 Family history of ischemic heart disease and other diseases of the circulatory system: Secondary | ICD-10-CM

## 2017-06-18 DIAGNOSIS — M1990 Unspecified osteoarthritis, unspecified site: Secondary | ICD-10-CM | POA: Diagnosis not present

## 2017-06-18 DIAGNOSIS — Z96659 Presence of unspecified artificial knee joint: Secondary | ICD-10-CM

## 2017-06-18 DIAGNOSIS — Z853 Personal history of malignant neoplasm of breast: Secondary | ICD-10-CM

## 2017-06-18 DIAGNOSIS — G8918 Other acute postprocedural pain: Secondary | ICD-10-CM | POA: Diagnosis not present

## 2017-06-18 HISTORY — PX: TOTAL KNEE ARTHROPLASTY: SHX125

## 2017-06-18 LAB — TYPE AND SCREEN
ABO/RH(D): O POS
Antibody Screen: NEGATIVE

## 2017-06-18 SURGERY — ARTHROPLASTY, KNEE, TOTAL
Anesthesia: Spinal | Site: Knee | Laterality: Right

## 2017-06-18 MED ORDER — ASPIRIN 81 MG PO CHEW: 81.0000 mg | CHEWABLE_TABLET | Freq: Two times a day (BID) | ORAL | 0 refills | Status: AC

## 2017-06-18 MED ORDER — DEXAMETHASONE SODIUM PHOSPHATE 10 MG/ML IJ SOLN
10.0000 mg | Freq: Once | INTRAMUSCULAR | Status: AC
  Administered 2017-06-18: 10 mg via INTRAVENOUS

## 2017-06-18 MED ORDER — ASPIRIN 81 MG PO CHEW
81.0000 mg | CHEWABLE_TABLET | Freq: Two times a day (BID) | ORAL | Status: DC
  Administered 2017-06-18 – 2017-06-20 (×4): 81 mg via ORAL
  Filled 2017-06-18 (×4): qty 1

## 2017-06-18 MED ORDER — FENTANYL CITRATE (PF) 100 MCG/2ML IJ SOLN
100.0000 ug | Freq: Once | INTRAMUSCULAR | Status: AC
  Administered 2017-06-18: 50 ug via INTRAVENOUS

## 2017-06-18 MED ORDER — HYDROCODONE-ACETAMINOPHEN 7.5-325 MG PO TABS
1.0000 | ORAL_TABLET | ORAL | Status: DC
  Administered 2017-06-18: 22:00:00 2 via ORAL
  Administered 2017-06-18 (×2): 1 via ORAL
  Administered 2017-06-19 (×5): 2 via ORAL
  Administered 2017-06-20 (×2): 1 via ORAL
  Administered 2017-06-20: 11:00:00 2 via ORAL
  Filled 2017-06-18: qty 1
  Filled 2017-06-18 (×2): qty 2
  Filled 2017-06-18: qty 1
  Filled 2017-06-18 (×3): qty 2
  Filled 2017-06-18 (×2): qty 1
  Filled 2017-06-18 (×3): qty 2

## 2017-06-18 MED ORDER — METHOCARBAMOL 1000 MG/10ML IJ SOLN
500.0000 mg | Freq: Four times a day (QID) | INTRAVENOUS | Status: DC | PRN
  Administered 2017-06-18: 500 mg via INTRAVENOUS
  Filled 2017-06-18: qty 550

## 2017-06-18 MED ORDER — LOSARTAN POTASSIUM 25 MG PO TABS
25.0000 mg | ORAL_TABLET | Freq: Every day | ORAL | Status: DC
  Administered 2017-06-18 – 2017-06-20 (×3): 25 mg via ORAL
  Filled 2017-06-18 (×3): qty 1

## 2017-06-18 MED ORDER — CARVEDILOL 6.25 MG PO TABS
6.2500 mg | ORAL_TABLET | Freq: Two times a day (BID) | ORAL | Status: DC
  Administered 2017-06-18 – 2017-06-20 (×4): 6.25 mg via ORAL
  Filled 2017-06-18 (×4): qty 1

## 2017-06-18 MED ORDER — BUPIVACAINE-EPINEPHRINE (PF) 0.25% -1:200000 IJ SOLN
INTRAMUSCULAR | Status: AC
  Filled 2017-06-18: qty 30

## 2017-06-18 MED ORDER — CELECOXIB 200 MG PO CAPS
200.0000 mg | ORAL_CAPSULE | Freq: Two times a day (BID) | ORAL | Status: DC
  Administered 2017-06-18 – 2017-06-20 (×5): 200 mg via ORAL
  Filled 2017-06-18 (×5): qty 1

## 2017-06-18 MED ORDER — HYDROMORPHONE HCL-NACL 0.5-0.9 MG/ML-% IV SOSY
0.2500 mg | PREFILLED_SYRINGE | INTRAVENOUS | Status: DC | PRN
  Administered 2017-06-18: 0.25 mg via INTRAVENOUS
  Administered 2017-06-18 (×2): 0.5 mg via INTRAVENOUS

## 2017-06-18 MED ORDER — FENTANYL CITRATE (PF) 100 MCG/2ML IJ SOLN
INTRAMUSCULAR | Status: DC | PRN
  Administered 2017-06-18 (×2): 50 ug via INTRAVENOUS
  Administered 2017-06-18: 100 ug via INTRAVENOUS
  Administered 2017-06-18: 50 ug via INTRAVENOUS

## 2017-06-18 MED ORDER — POLYETHYLENE GLYCOL 3350 17 G PO PACK
17.0000 g | PACK | Freq: Two times a day (BID) | ORAL | Status: DC
  Administered 2017-06-18 – 2017-06-20 (×4): 17 g via ORAL
  Filled 2017-06-18 (×4): qty 1

## 2017-06-18 MED ORDER — PHENOL 1.4 % MT LIQD: 1.0000 | OROMUCOSAL | Status: DC | PRN

## 2017-06-18 MED ORDER — CEFAZOLIN SODIUM-DEXTROSE 2-4 GM/100ML-% IV SOLN
2.0000 g | Freq: Four times a day (QID) | INTRAVENOUS | Status: AC
  Administered 2017-06-18 (×2): 2 g via INTRAVENOUS
  Filled 2017-06-18 (×2): qty 100

## 2017-06-18 MED ORDER — TRANEXAMIC ACID 1000 MG/10ML IV SOLN
1000.0000 mg | INTRAVENOUS | Status: AC
  Administered 2017-06-18: 1000 mg via INTRAVENOUS
  Filled 2017-06-18: qty 1100

## 2017-06-18 MED ORDER — ROCURONIUM BROMIDE 100 MG/10ML IV SOLN
INTRAVENOUS | Status: DC | PRN
  Administered 2017-06-18: 40 mg via INTRAVENOUS

## 2017-06-18 MED ORDER — ROCURONIUM BROMIDE 50 MG/5ML IV SOSY
PREFILLED_SYRINGE | INTRAVENOUS | Status: AC
  Filled 2017-06-18: qty 5

## 2017-06-18 MED ORDER — DEXAMETHASONE SODIUM PHOSPHATE 10 MG/ML IJ SOLN
INTRAMUSCULAR | Status: AC
  Filled 2017-06-18: qty 1

## 2017-06-18 MED ORDER — FERROUS SULFATE 325 (65 FE) MG PO TABS
325.0000 mg | ORAL_TABLET | Freq: Three times a day (TID) | ORAL | Status: DC
  Administered 2017-06-18 – 2017-06-20 (×5): 325 mg via ORAL
  Filled 2017-06-18 (×5): qty 1

## 2017-06-18 MED ORDER — NON FORMULARY: 20.0000 mg | Freq: Every day | Status: DC

## 2017-06-18 MED ORDER — TRANEXAMIC ACID 1000 MG/10ML IV SOLN
1000.0000 mg | Freq: Once | INTRAVENOUS | Status: AC
  Administered 2017-06-18: 15:00:00 1000 mg via INTRAVENOUS
  Filled 2017-06-18: qty 1100
  Filled 2017-06-18: qty 10

## 2017-06-18 MED ORDER — PROMETHAZINE HCL 25 MG/ML IJ SOLN: 6.2500 mg | INTRAMUSCULAR | Status: DC | PRN

## 2017-06-18 MED ORDER — FENTANYL CITRATE (PF) 250 MCG/5ML IJ SOLN
INTRAMUSCULAR | Status: AC
  Filled 2017-06-18: qty 5

## 2017-06-18 MED ORDER — BISACODYL 10 MG RE SUPP: 10.0000 mg | Freq: Every day | RECTAL | Status: DC | PRN

## 2017-06-18 MED ORDER — KETOROLAC TROMETHAMINE 30 MG/ML IJ SOLN
INTRAMUSCULAR | Status: AC
  Filled 2017-06-18: qty 1

## 2017-06-18 MED ORDER — SODIUM CHLORIDE 0.9 % IJ SOLN
INTRAMUSCULAR | Status: AC
  Filled 2017-06-18: qty 50

## 2017-06-18 MED ORDER — MIDAZOLAM HCL 2 MG/2ML IJ SOLN
INTRAMUSCULAR | Status: AC
  Administered 2017-06-18: 1 mg via INTRAVENOUS
  Filled 2017-06-18: qty 2

## 2017-06-18 MED ORDER — ONDANSETRON HCL 4 MG/2ML IJ SOLN: 4.0000 mg | Freq: Four times a day (QID) | INTRAMUSCULAR | Status: DC | PRN

## 2017-06-18 MED ORDER — OMEPRAZOLE 20 MG PO CPDR
20.0000 mg | DELAYED_RELEASE_CAPSULE | Freq: Every day | ORAL | Status: DC
  Administered 2017-06-19 – 2017-06-20 (×2): 20 mg via ORAL
  Filled 2017-06-18 (×2): qty 1

## 2017-06-18 MED ORDER — CEFAZOLIN SODIUM-DEXTROSE 2-4 GM/100ML-% IV SOLN
2.0000 g | INTRAVENOUS | Status: AC
  Administered 2017-06-18: 2 g via INTRAVENOUS

## 2017-06-18 MED ORDER — ONDANSETRON HCL 4 MG/2ML IJ SOLN
INTRAMUSCULAR | Status: DC | PRN
  Administered 2017-06-18: 4 mg via INTRAVENOUS

## 2017-06-18 MED ORDER — HYDROCODONE-ACETAMINOPHEN 7.5-325 MG PO TABS: 1.0000 | ORAL_TABLET | ORAL | 0 refills | Status: DC | PRN

## 2017-06-18 MED ORDER — DIPHENHYDRAMINE HCL 25 MG PO CAPS
25.0000 mg | ORAL_CAPSULE | Freq: Four times a day (QID) | ORAL | Status: DC | PRN
  Administered 2017-06-19 – 2017-06-20 (×2): 25 mg via ORAL
  Filled 2017-06-18 (×2): qty 1

## 2017-06-18 MED ORDER — LEVOTHYROXINE SODIUM 100 MCG PO TABS
100.0000 ug | ORAL_TABLET | Freq: Every day | ORAL | Status: DC
  Administered 2017-06-19 – 2017-06-20 (×2): 100 ug via ORAL
  Filled 2017-06-18 (×2): qty 1

## 2017-06-18 MED ORDER — METHOCARBAMOL 500 MG PO TABS
500.0000 mg | ORAL_TABLET | Freq: Four times a day (QID) | ORAL | Status: DC | PRN
  Administered 2017-06-19 – 2017-06-20 (×3): 500 mg via ORAL
  Filled 2017-06-18 (×3): qty 1

## 2017-06-18 MED ORDER — ONDANSETRON HCL 4 MG/2ML IJ SOLN
INTRAMUSCULAR | Status: AC
  Filled 2017-06-18: qty 2

## 2017-06-18 MED ORDER — MIDAZOLAM HCL 2 MG/2ML IJ SOLN
2.0000 mg | Freq: Once | INTRAMUSCULAR | Status: AC
  Administered 2017-06-18: 1 mg via INTRAVENOUS

## 2017-06-18 MED ORDER — MAGNESIUM CITRATE PO SOLN: 1.0000 | Freq: Once | ORAL | Status: DC | PRN

## 2017-06-18 MED ORDER — LACTATED RINGERS IV SOLN
INTRAVENOUS | Status: DC
  Administered 2017-06-18 (×2): via INTRAVENOUS

## 2017-06-18 MED ORDER — ONDANSETRON HCL 4 MG PO TABS: 4.0000 mg | ORAL_TABLET | Freq: Four times a day (QID) | ORAL | Status: DC | PRN

## 2017-06-18 MED ORDER — ALUM & MAG HYDROXIDE-SIMETH 200-200-20 MG/5ML PO SUSP: 15.0000 mL | ORAL | Status: DC | PRN

## 2017-06-18 MED ORDER — DOCUSATE SODIUM 100 MG PO CAPS: 100.0000 mg | ORAL_CAPSULE | Freq: Two times a day (BID) | ORAL | 0 refills | Status: DC

## 2017-06-18 MED ORDER — SUGAMMADEX SODIUM 200 MG/2ML IV SOLN
INTRAVENOUS | Status: AC
  Filled 2017-06-18: qty 2

## 2017-06-18 MED ORDER — PHENYLEPHRINE 40 MCG/ML (10ML) SYRINGE FOR IV PUSH (FOR BLOOD PRESSURE SUPPORT)
PREFILLED_SYRINGE | INTRAVENOUS | Status: AC
  Filled 2017-06-18: qty 10

## 2017-06-18 MED ORDER — LIDOCAINE HCL (CARDIAC) 20 MG/ML IV SOLN
INTRAVENOUS | Status: DC | PRN
  Administered 2017-06-18: 60 mg via INTRAVENOUS

## 2017-06-18 MED ORDER — FENTANYL CITRATE (PF) 100 MCG/2ML IJ SOLN
25.0000 ug | INTRAMUSCULAR | Status: DC | PRN
  Administered 2017-06-18: 25 ug via INTRAVENOUS
  Administered 2017-06-19: 50 ug via INTRAVENOUS
  Filled 2017-06-18 (×2): qty 2

## 2017-06-18 MED ORDER — MENTHOL 3 MG MT LOZG: 1.0000 | LOZENGE | OROMUCOSAL | Status: DC | PRN

## 2017-06-18 MED ORDER — FERROUS SULFATE 325 (65 FE) MG PO TABS: 325.0000 mg | ORAL_TABLET | Freq: Three times a day (TID) | ORAL | 3 refills | Status: DC

## 2017-06-18 MED ORDER — SUGAMMADEX SODIUM 200 MG/2ML IV SOLN
INTRAVENOUS | Status: DC | PRN
  Administered 2017-06-18: 160 mg via INTRAVENOUS

## 2017-06-18 MED ORDER — ROPIVACAINE HCL 7.5 MG/ML IJ SOLN
INTRAMUSCULAR | Status: DC | PRN
  Administered 2017-06-18: 20 mL via PERINEURAL

## 2017-06-18 MED ORDER — CHLORHEXIDINE GLUCONATE 4 % EX LIQD: 60.0000 mL | Freq: Once | CUTANEOUS | Status: DC

## 2017-06-18 MED ORDER — DOCUSATE SODIUM 100 MG PO CAPS
100.0000 mg | ORAL_CAPSULE | Freq: Two times a day (BID) | ORAL | Status: DC
  Administered 2017-06-18 – 2017-06-20 (×5): 100 mg via ORAL
  Filled 2017-06-18 (×5): qty 1

## 2017-06-18 MED ORDER — SODIUM CHLORIDE 0.9 % IJ SOLN
INTRAMUSCULAR | Status: DC | PRN
  Administered 2017-06-18: 30 mL via INTRAVENOUS

## 2017-06-18 MED ORDER — POLYETHYLENE GLYCOL 3350 17 G PO PACK: 17.0000 g | PACK | Freq: Two times a day (BID) | ORAL | 0 refills | Status: DC

## 2017-06-18 MED ORDER — PHENYLEPHRINE HCL 10 MG/ML IJ SOLN
INTRAMUSCULAR | Status: DC | PRN
  Administered 2017-06-18 (×2): 40 ug via INTRAVENOUS

## 2017-06-18 MED ORDER — METOCLOPRAMIDE HCL 5 MG/ML IJ SOLN: 5.0000 mg | Freq: Three times a day (TID) | INTRAMUSCULAR | Status: DC | PRN

## 2017-06-18 MED ORDER — BUPIVACAINE-EPINEPHRINE (PF) 0.25% -1:200000 IJ SOLN
INTRAMUSCULAR | Status: DC | PRN
  Administered 2017-06-18: 30 mL via PERINEURAL

## 2017-06-18 MED ORDER — HYDROCHLOROTHIAZIDE 25 MG PO TABS
25.0000 mg | ORAL_TABLET | Freq: Every day | ORAL | Status: DC
  Administered 2017-06-18 – 2017-06-20 (×3): 25 mg via ORAL
  Filled 2017-06-18 (×3): qty 1

## 2017-06-18 MED ORDER — DEXAMETHASONE SODIUM PHOSPHATE 10 MG/ML IJ SOLN
10.0000 mg | Freq: Once | INTRAMUSCULAR | Status: AC
  Administered 2017-06-19: 10 mg via INTRAVENOUS
  Filled 2017-06-18: qty 1

## 2017-06-18 MED ORDER — SODIUM CHLORIDE 0.9 % IV SOLN
INTRAVENOUS | Status: DC
  Administered 2017-06-18 – 2017-06-19 (×2): via INTRAVENOUS

## 2017-06-18 MED ORDER — DIPHENHYDRAMINE HCL 50 MG/ML IJ SOLN
INTRAMUSCULAR | Status: AC
  Filled 2017-06-18: qty 1

## 2017-06-18 MED ORDER — PRAVASTATIN SODIUM 20 MG PO TABS
20.0000 mg | ORAL_TABLET | Freq: Every day | ORAL | Status: DC
  Administered 2017-06-18 – 2017-06-19 (×2): 20 mg via ORAL
  Filled 2017-06-18 (×2): qty 1

## 2017-06-18 MED ORDER — KETOROLAC TROMETHAMINE 30 MG/ML IJ SOLN
INTRAMUSCULAR | Status: DC | PRN
  Administered 2017-06-18: 30 mg via INTRAVENOUS

## 2017-06-18 MED ORDER — PROPOFOL 10 MG/ML IV BOLUS
INTRAVENOUS | Status: DC | PRN
  Administered 2017-06-18: 120 mg via INTRAVENOUS

## 2017-06-18 MED ORDER — DIPHENHYDRAMINE HCL 50 MG/ML IJ SOLN
12.5000 mg | Freq: Once | INTRAMUSCULAR | Status: AC
  Administered 2017-06-18: 12.5 mg via INTRAVENOUS

## 2017-06-18 MED ORDER — METHOCARBAMOL 500 MG PO TABS: 500.0000 mg | ORAL_TABLET | Freq: Four times a day (QID) | ORAL | 0 refills | Status: DC | PRN

## 2017-06-18 MED ORDER — CEFAZOLIN SODIUM-DEXTROSE 2-4 GM/100ML-% IV SOLN
INTRAVENOUS | Status: AC
Start: 2017-06-18 — End: 2017-06-18
  Filled 2017-06-18: qty 100

## 2017-06-18 MED ORDER — MIDAZOLAM HCL 2 MG/2ML IJ SOLN
INTRAMUSCULAR | Status: AC
  Filled 2017-06-18: qty 2

## 2017-06-18 MED ORDER — METOCLOPRAMIDE HCL 5 MG PO TABS: 5.0000 mg | ORAL_TABLET | Freq: Three times a day (TID) | ORAL | Status: DC | PRN

## 2017-06-18 MED ORDER — HYDROMORPHONE HCL-NACL 0.5-0.9 MG/ML-% IV SOSY
PREFILLED_SYRINGE | INTRAVENOUS | Status: AC
Start: 2017-06-18 — End: 2017-06-18
  Administered 2017-06-18: 0.5 mg via INTRAVENOUS
  Filled 2017-06-18: qty 4

## 2017-06-18 MED ORDER — FENTANYL CITRATE (PF) 100 MCG/2ML IJ SOLN
INTRAMUSCULAR | Status: AC
  Administered 2017-06-18: 50 ug via INTRAVENOUS
  Filled 2017-06-18: qty 2

## 2017-06-18 MED ORDER — PROPOFOL 10 MG/ML IV BOLUS
INTRAVENOUS | Status: AC
  Filled 2017-06-18: qty 40

## 2017-06-18 MED ORDER — FENTANYL CITRATE (PF) 100 MCG/2ML IJ SOLN
INTRAMUSCULAR | Status: DC
Start: 2017-06-18 — End: 2017-06-18
  Filled 2017-06-18: qty 2

## 2017-06-18 SURGICAL SUPPLY — 45 items
BAG DECANTER FOR FLEXI CONT (MISCELLANEOUS) ×3 IMPLANT
BAG ZIPLOCK 12X15 (MISCELLANEOUS) ×3 IMPLANT
BANDAGE ACE 6X5 VEL STRL LF (GAUZE/BANDAGES/DRESSINGS) ×3 IMPLANT
BLADE SAW SGTL 11.0X1.19X90.0M (BLADE) IMPLANT
BLADE SAW SGTL 13.0X1.19X90.0M (BLADE) ×3 IMPLANT
BOWL SMART MIX CTS (DISPOSABLE) ×3 IMPLANT
CAPT KNEE TOTAL 3 ATTUNE ×3 IMPLANT
CEMENT HV SMART SET (Cement) ×6 IMPLANT
COVER SURGICAL LIGHT HANDLE (MISCELLANEOUS) ×3 IMPLANT
CUFF TOURN SGL QUICK 34 (TOURNIQUET CUFF) ×2
CUFF TRNQT CYL 34X4X40X1 (TOURNIQUET CUFF) ×1 IMPLANT
DECANTER SPIKE VIAL GLASS SM (MISCELLANEOUS) ×3 IMPLANT
DERMABOND ADVANCED (GAUZE/BANDAGES/DRESSINGS) ×2
DERMABOND ADVANCED .7 DNX12 (GAUZE/BANDAGES/DRESSINGS) ×1 IMPLANT
DRAPE U-SHAPE 47X51 STRL (DRAPES) ×3 IMPLANT
DRESSING AQUACEL AG SP 3.5X10 (GAUZE/BANDAGES/DRESSINGS) ×1 IMPLANT
DRSG AQUACEL AG SP 3.5X10 (GAUZE/BANDAGES/DRESSINGS) ×3
DURAPREP 26ML APPLICATOR (WOUND CARE) ×6 IMPLANT
ELECT REM PT RETURN 15FT ADLT (MISCELLANEOUS) ×3 IMPLANT
GLOVE BIOGEL M 7.0 STRL (GLOVE) ×9 IMPLANT
GLOVE BIOGEL PI IND STRL 7.5 (GLOVE) ×1 IMPLANT
GLOVE BIOGEL PI IND STRL 8.5 (GLOVE) ×1 IMPLANT
GLOVE BIOGEL PI INDICATOR 7.5 (GLOVE) ×2
GLOVE BIOGEL PI INDICATOR 8.5 (GLOVE) ×2
GLOVE ECLIPSE 8.0 STRL XLNG CF (GLOVE) ×3 IMPLANT
GLOVE ORTHO TXT STRL SZ7.5 (GLOVE) ×6 IMPLANT
GOWN STRL REUS W/TWL LRG LVL3 (GOWN DISPOSABLE) ×3 IMPLANT
GOWN STRL REUS W/TWL XL LVL3 (GOWN DISPOSABLE) ×3 IMPLANT
HANDPIECE INTERPULSE COAX TIP (DISPOSABLE) ×2
MANIFOLD NEPTUNE II (INSTRUMENTS) ×3 IMPLANT
PACK TOTAL KNEE CUSTOM (KITS) ×3 IMPLANT
POSITIONER SURGICAL ARM (MISCELLANEOUS) ×3 IMPLANT
SET HNDPC FAN SPRY TIP SCT (DISPOSABLE) ×1 IMPLANT
SET PAD KNEE POSITIONER (MISCELLANEOUS) ×3 IMPLANT
SUT MNCRL AB 4-0 PS2 18 (SUTURE) ×3 IMPLANT
SUT STRATAFIX 0 PDS 27 VIOLET (SUTURE) ×3
SUT VIC AB 1 CT1 36 (SUTURE) ×3 IMPLANT
SUT VIC AB 2-0 CT1 27 (SUTURE) ×6
SUT VIC AB 2-0 CT1 TAPERPNT 27 (SUTURE) ×3 IMPLANT
SUTURE STRATFX 0 PDS 27 VIOLET (SUTURE) ×1 IMPLANT
SYR 50ML LL SCALE MARK (SYRINGE) ×3 IMPLANT
TRAY FOLEY W/METER SILVER 16FR (SET/KITS/TRAYS/PACK) ×3 IMPLANT
WATER STERILE IRR 1500ML POUR (IV SOLUTION) ×3 IMPLANT
WRAP KNEE MAXI GEL POST OP (GAUZE/BANDAGES/DRESSINGS) ×3 IMPLANT
YANKAUER SUCT BULB TIP 10FT TU (MISCELLANEOUS) ×3 IMPLANT

## 2017-06-18 NOTE — Anesthesia Procedure Notes (Addendum)
Anesthesia Regional Block: Adductor canal block   Pre-Anesthetic Checklist: ,, timeout performed, Correct Patient, Correct Site, Correct Laterality, Correct Procedure, Correct Position, site marked, Risks and benefits discussed, Surgical consent,  Pre-op evaluation,  Post-op pain management  Laterality: Right and Lower  Prep: chloraprep       Needles:   Needle Type: Echogenic Stimulator Needle     Needle Length: 9cm  Needle Gauge: 21   Needle insertion depth: 6 cm   Additional Needles:   Procedures: ultrasound guided,,,,,,,,  Narrative:  Start time: 06/18/2017 8:15 AM End time: 06/18/2017 8:30 AM Injection made incrementally with aspirations every 5 mL.  Performed by: Personally  Anesthesiologist: Aaron Bostwick

## 2017-06-18 NOTE — Anesthesia Preprocedure Evaluation (Addendum)
Anesthesia Evaluation  Patient identified by MRN, date of birth, ID band Patient awake    Reviewed: Allergy & Precautions, NPO status , Patient's Chart, lab work & pertinent test results  Airway Mallampati: II   Neck ROM: Full    Dental  (+) Teeth Intact   Pulmonary shortness of breath,    breath sounds clear to auscultation       Cardiovascular hypertension,  Rhythm:Regular Rate:Normal     Neuro/Psych    GI/Hepatic GERD  ,  Endo/Other  Hypothyroidism   Renal/GU      Musculoskeletal  (+) Arthritis ,   Abdominal   Peds  Hematology   Anesthesia Other Findings   Reproductive/Obstetrics                            Anesthesia Physical Anesthesia Plan  ASA: III  Anesthesia Plan: General   Post-op Pain Management:    Induction: Intravenous  PONV Risk Score and Plan: 3 and Ondansetron, Dexamethasone, Midazolam and Treatment may vary due to age or medical condition  Airway Management Planned: Oral ETT  Additional Equipment:   Intra-op Plan:   Post-operative Plan: Extubation in OR  Informed Consent: I have reviewed the patients History and Physical, chart, labs and discussed the procedure including the risks, benefits and alternatives for the proposed anesthesia with the patient or authorized representative who has indicated his/her understanding and acceptance.     Plan Discussed with:   Anesthesia Plan Comments:        Anesthesia Quick Evaluation

## 2017-06-18 NOTE — Interval H&P Note (Signed)
History and Physical Interval Note:  06/18/2017 8:43 AM  Zoe [name]  has presented today for surgery, with the diagnosis of Right knee osteoarthritis   The various methods of treatment have been discussed with the patient and family. After consideration of risks, benefits and other options for treatment, the patient has consented to  Procedure(s): RIGHT TOTAL KNEE ARTHROPLASTY (Right) as a surgical intervention .  The patient's history has been reviewed, patient examined, no change in status, stable for surgery.  I have reviewed the patient's chart and labs.  Questions were answered to the patient's satisfaction.     Mauri Pole

## 2017-06-18 NOTE — Op Note (Signed)
NAME:  Zoe [name] RECORD NO.:  [id number]                             FACILITY:  [facility]      PHYSICIAN:  Pietro Cassis. Alvan Dame, M.D.  DATE OF BIRTH:  Mar 19, 1934      DATE OF PROCEDURE:  06/18/2017                                     OPERATIVE REPORT         PREOPERATIVE DIAGNOSIS:  Right knee osteoarthritis.      POSTOPERATIVE DIAGNOSIS:  Right knee osteoarthritis.      FINDINGS:  The patient was noted to have complete loss of cartilage and   bone-on-bone arthritis with associated osteophytes in the medial and patellofemoral compartments of   the knee with a significant synovitis and associated effusion.      PROCEDURE:  Right total knee replacement.      COMPONENTS USED:  DePuy Attune rotating platform posterior stabilized knee   system, a size 5 femur, 5 tibia, size 5 mm PS AOX insert, and 35 anatomic patellar   button.      SURGEON:  Pietro Cassis. Alvan Dame, M.D.      ASSISTANT:  Danae Orleans, PA-C.      ANESTHESIA:  General and Regional.      SPECIMENS:  None.      COMPLICATION:  None.      DRAINS:  None.  EBL: <100cc      TOURNIQUET TIME:   Total Tourniquet Time Documented: Thigh (Right) - 23 minutes Total: Thigh (Right) - 23 minutes      The patient was stable to the recovery room.      INDICATION FOR PROCEDURE:  Zoe [name] is a 83 y.o. female patient of   mine.  The patient had been seen, evaluated, and treated conservatively in the   office with medication, activity modification, and injections.  The patient had   radiographic changes of bone-on-bone arthritis with endplate sclerosis and osteophytes noted.      The patient failed conservative measures including medication, injections, and activity modification, and at this point was ready for more definitive measures.   Based on the radiographic changes and failed conservative measures, the patient   decided to proceed with total knee replacement.  Risks of infection,   DVT, component failure, need for revision surgery, postop course, and   expectations were all   discussed and reviewed.  Consent was obtained for benefit of pain   relief.      PROCEDURE IN DETAIL:  The patient was brought to the operative theater.   Once adequate anesthesia, preoperative antibiotics, 2 gm of Ancef, 1 gm of Tranexamic Acid, and 10 mg of Decadron administered, the patient was positioned supine with the right thigh tourniquet placed.  The  right lower extremity was prepped and draped in sterile fashion.  A time-   out was performed identifying the patient, planned procedure, and   extremity.      The right lower extremity was placed in the [facility] leg holder.  The leg was   exsanguinated, tourniquet elevated to 250 mmHg.  A midline incision was   made followed  by median parapatellar arthrotomy.  Following initial   exposure, attention was first directed to the patella.  Precut   measurement was noted to be 23 mm.  I resected down to 13-14 mm and used a   35 anatomic patellar button to restore patellar height as well as cover the cut   surface.      The lug holes were drilled and a metal shim was placed to protect the   patella from retractors and saw blades.      At this point, attention was now directed to the femur.  The femoral   canal was opened with a drill, irrigated to try to prevent fat emboli.  An   intramedullary rod was passed at 3 degrees valgus, 9 mm of bone was   resected off the distal femur.  Following this resection, the tibia was   subluxated anteriorly.  Using the extramedullary guide, 2 mm of bone was resected off   the proximal medial tibia.  We confirmed the gap would be   stable medially and laterally with a size 5 spacer block as well as confirmed   the cut was perpendicular in the coronal plane, checking with an alignment rod.      Once this was done, I sized the femur to be a size 5 in the anterior-   posterior dimension, chose a standard  component based on medial and   lateral dimension.  The size 5 rotation block was then pinned in   position anterior referenced using the C-clamp to set rotation.  The   anterior, posterior, and  chamfer cuts were made without difficulty nor   notching making certain that I was along the anterior cortex to help   with flexion gap stability.      The final box cut was made off the lateral aspect of distal femur.      At this point, the tibia was sized to be a size 5, the size 5 tray was   then pinned in position through the medial third of the tubercle,   drilled, and keel punched.  Trial reduction was now carried with a 5 femur,  5 tibia, a size mm PS insert, and the 35 anatomic patella botton.  The knee was brought to   extension, full extension with good flexion stability with the patella   tracking through the trochlea without application of pressure.  Given   all these findings the femoral lug holes were the trial components removed.  Final components were   opened and cement was mixed.  The knee was irrigated with normal saline   solution and pulse lavage.  The synovial lining was   then injected with 30 cc of 0.25% Marcaine with epinephrine and 1 cc of Toradol plus 30 cc of NS for a total of 61 cc.      The knee was irrigated.  Final implants were then cemented onto clean and   dried cut surfaces of bone with the knee brought to extension with a size 5 mm PS trial insert.      Once the cement had fully cured, the excess cement was removed   throughout the knee.  I confirmed I was satisfied with the range of   motion and stability, and the final size 5 mm PS AOX insert was chosen.  It was   placed into the knee.      The tourniquet had been let down at 23 minutes.  No significant  hemostasis required.  The   extensor mechanism was then reapproximated using #1 Vicryl and #0 Stratafix sutures with the knee   in flexion.  The   remaining wound was closed with 2-0 Vicryl and running  4-0 Monocryl.   The knee was cleaned, dried, dressed sterilely using Dermabond and   Aquacel dressing.  The patient was then   brought to recovery room in stable condition, tolerating the procedure   well.   Please note that Physician Assistant, Danae Orleans, PA-C, was present for the entirety of the case, and was utilized for pre-operative positioning, peri-operative retractor management, general facilitation of the procedure.  He was also utilized for primary wound closure at the end of the case.              Pietro Cassis Alvan Dame, M.D.    06/18/2017 11:04 AM

## 2017-06-18 NOTE — Transfer of Care (Signed)
Immediate Anesthesia Transfer of Care Note  Patient: [name]  Procedure(s) Performed: Procedure(s): RIGHT TOTAL KNEE ARTHROPLASTY (Right)  Patient Location: PACU  Anesthesia Type:General  Level of Consciousness: awake, alert  and oriented  Airway & Oxygen Therapy: Patient Spontanous Breathing and Patient connected to face mask oxygen  Post-op Assessment: Report given to RN and Post -op Vital signs reviewed and stable  Post vital signs: Reviewed and stable  Last Vitals:  Vitals:   06/18/17 0850 06/18/17 0900  BP: (!) 148/60 133/63  Pulse: 67 66  Resp: (!) 22 18  Temp:    SpO2: 99% 98%    Last Pain:  Vitals:   06/18/17 0723  TempSrc: Oral  PainSc:          Complications: No apparent anesthesia complications

## 2017-06-18 NOTE — Progress Notes (Signed)
AssistedDr. Massagee with right, ultrasound guided, adductor canal block. Side rails up, monitors on throughout procedure. See vital signs in flow sheet. Tolerated Procedure well.  

## 2017-06-18 NOTE — Evaluation (Signed)
Physical Therapy Evaluation Patient Details Name: Zoe [name] MRN: [id number] DOB: 03-07-1934 Today's Date: 06/18/2017   History of Present Illness  46 yr s/p RTKA , with recent [facility] 1 year ago. Also 6 moths ago according to pt she had a compression fracture of L3 and had kyphoplasty performed.   Clinical Impression  Pt is s/p TKA resulting in the deficits listed below (see PT Problem List).  Pt will benefit from acute PT to increase their independence and safety with mobility to allow discharge to the venue listed below.      Follow Up Recommendations DC plan and follow up therapy as arranged by surgeon    Equipment Recommendations  Rolling walker with 5" wheels    Recommendations for Other Services       Precautions / Restrictions Precautions Precautions: None;Knee Required Braces or Orthoses: Knee Immobilizer - Right Knee Immobilizer - Right: Discontinue once straight leg raise with < 10 degree lag Restrictions Weight Bearing Restrictions: No      Mobility  Bed Mobility Overal bed mobility: Needs Assistance Bed Mobility: Supine to Sit;Sit to Supine     Supine to sit: Min assist     General bed mobility comments: assist with RLE and cues for technique and sequence   Transfers Overall transfer level: Needs assistance Equipment used: Rolling walker (2 wheeled) Transfers: Sit to/from Stand Sit to Stand: Min assist         General transfer comment: cues for safety of RW use and hand placement   Ambulation/Gait Ambulation/Gait assistance: Min assist Ambulation Distance (Feet): 45 Feet Assistive device: Rolling walker (2 wheeled) Gait Pattern/deviations: Step-to pattern     General Gait Details: cues for gait patern, RW safety and use  Stairs            Wheelchair Mobility    Modified Rankin (Stroke Patients Only)       Balance Overall balance assessment: Needs assistance Sitting-balance support: Feet supported Sitting balance-Leahy  Scale: Good     Standing balance support: Bilateral upper extremity supported                                 Pertinent Vitals/Pain Pain Assessment: 0-10 Pain Score: 3  Pain Location: R knee  Pain Descriptors / Indicators: Aching Pain Intervention(s): Monitored during session;Ice applied    Home Living Family/patient expects to be discharged to:: Skilled nursing facility (states [place] ) Living Arrangements: Spouse/significant other                    Prior Function Level of Independence: Independent         Comments: occassionally had to use the cane, has a 4 wheeled RW , and 3n1     Hand Dominance        Extremity/Trunk Assessment        Lower Extremity Assessment Lower Extremity Assessment: RLE deficits/detail RLE Deficits / Details: grossly 3+/5 can perform 5 sLR indepedently, knee ROM grossly 0-50 degrees supine        Communication   Communication: No difficulties  Cognition Arousal/Alertness: Awake/alert Behavior During Therapy: WFL for tasks assessed/performed Overall Cognitive Status: Within Functional Limits for tasks assessed                                        General Comments  Exercises Total Joint Exercises Ankle Circles/Pumps: Both;10 reps;Supine;AROM Quad Sets: AROM;Supine;Right;5 reps Heel Slides: AAROM;Supine;Right;5 reps Straight Leg Raises: AAROM;Supine;Right;5 reps Goniometric ROM: 0-50 supine   Assessment/Plan    PT Assessment Patient needs continued PT services  PT Problem List Decreased strength;Decreased range of motion;Decreased activity tolerance;Decreased knowledge of precautions;Decreased mobility       PT Treatment Interventions DME instruction;Gait training;Stair training;Functional mobility training;Therapeutic activities;Therapeutic exercise;Patient/family education    PT Goals (Current goals can be found in the Care Plan section)  Acute Rehab PT Goals Patient Stated  Goal: I want to be able to walk and move around again  PT Goal Formulation: With patient Time For Goal Achievement: 06/25/17 Potential to Achieve Goals: Good    Frequency 7X/week   Barriers to discharge        Co-evaluation               AM-PAC PT "6 Clicks" Daily Activity  Outcome Measure Difficulty turning over in bed (including adjusting bedclothes, sheets and blankets)?: A Lot Difficulty moving from lying on back to sitting on the side of the bed? : A Lot Difficulty sitting down on and standing up from a chair with arms (e.g., wheelchair, bedside commode, etc,.)?: A Lot Help needed moving to and from a bed to chair (including a wheelchair)?: A Little Help needed walking in hospital room?: A Little Help needed climbing 3-5 steps with a railing? : A Lot 6 Click Score: 14    End of Session Equipment Utilized During Treatment: Gait belt Activity Tolerance: Patient tolerated treatment well Patient left: in chair;with call bell/phone within reach;with chair alarm set Nurse Communication: Mobility status PT Visit Diagnosis: Difficulty in walking, not elsewhere classified (R26.2)    Time: 1700-1735 PT Time Calculation (min) (ACUTE ONLY): 35 min   Charges:   PT Evaluation $PT Eval Low Complexity: 1 Low PT Treatments $Gait Training: 8-22 mins $Therapeutic Exercise: 8-22 mins   PT G Codes:        Clide Dales, PT Pager: [phone] 06/18/2017   Zoe [name], Gatha Mayer 06/18/2017, 9:13 PM

## 2017-06-18 NOTE — Anesthesia Procedure Notes (Signed)
Procedure Name: Intubation Date/Time: 06/18/2017 9:44 AM Performed by: Glory Buff Pre-anesthesia Checklist: Patient identified, Emergency Drugs available, Suction available and Patient being monitored Patient Re-evaluated:Patient Re-evaluated prior to induction Oxygen Delivery Method: Circle system utilized Preoxygenation: Pre-oxygenation with 100% oxygen Induction Type: IV induction Ventilation: Mask ventilation without difficulty Laryngoscope Size: Miller and 3 Grade View: Grade I Tube type: Oral Tube size: 7.0 mm Number of attempts: 1 Airway Equipment and Method: Stylet and Oral airway Placement Confirmation: ETT inserted through vocal cords under direct vision,  positive ETCO2 and breath sounds checked- equal and bilateral Secured at: 21 cm Tube secured with: Tape Dental Injury: Teeth and Oropharynx as per pre-operative assessment

## 2017-06-19 LAB — BASIC METABOLIC PANEL
ANION GAP: 7 (ref 5–15)
BUN: 17 mg/dL (ref 6–20)
CALCIUM: 8.1 mg/dL — AB (ref 8.9–10.3)
CO2: 25 mmol/L (ref 22–32)
Chloride: 99 mmol/L — ABNORMAL LOW (ref 101–111)
Creatinine, Ser: 0.76 mg/dL (ref 0.44–1.00)
GLUCOSE: 119 mg/dL — AB (ref 65–99)
POTASSIUM: 3.9 mmol/L (ref 3.5–5.1)
Sodium: 131 mmol/L — ABNORMAL LOW (ref 135–145)

## 2017-06-19 LAB — CBC
HCT: 26.8 % — ABNORMAL LOW (ref 36.0–46.0)
Hemoglobin: 8.6 g/dL — ABNORMAL LOW (ref 12.0–15.0)
MCH: 27 pg (ref 26.0–34.0)
MCHC: 32.1 g/dL (ref 30.0–36.0)
MCV: 84 fL (ref 78.0–100.0)
PLATELETS: 176 10*3/uL (ref 150–400)
RBC: 3.19 MIL/uL — AB (ref 3.87–5.11)
RDW: 14.2 % (ref 11.5–15.5)
WBC: 13.5 10*3/uL — AB (ref 4.0–10.5)

## 2017-06-19 NOTE — Clinical Social Work Note (Signed)
Clinical Social Work Assessment  Patient Details  Name: Zoe [name] MRN: [id number] Date of Birth: 06-11-1934  Date of referral:  06/19/17               Reason for consult:  Facility Placement                Permission sought to share information with:  Family Supports Permission granted to share information::  Yes, Verbal Permission Granted  Name::        Agency::     Relationship::  Spouse  Contact Information:     Housing/Transportation Living arrangements for the past 2 months:  Single Family Home Source of Information:  Patient Patient Interpreter Needed:  None Criminal Activity/Legal Involvement Pertinent to Current Situation/Hospitalization:  No  Significant Relationships:  Spouse Lives with:  Spouse Do you feel safe going back to the place where you live?  Yes Need for family participation in patient care:  Yes  Care giving concerns:  SNF placement for short term rehab before returning home.   Social Worker assessment / plan:  CSW met with patient and spouse at bedside, explain role and reason for visit- to assist with discharge to [facility]. Patient reports she was pleased to know she was going to [facility]. Patient reports her spouse plans to transport at discharge.  CSW completed FL2, PASRR.   Plan: Assist with discharge to [facility]  Employment status:  Retired Nurse, adult PT Recommendations:  [place] / Referral to community resources:  [place]  Patient/Family's Response to care:  Agreeable and Responding to care.   Patient/Family's Understanding of and Emotional Response to Diagnosis, Current Treatment, and Prognosis:  "I am looking forward to completing rehab and going home."  Emotional Assessment Appearance:  Appears stated age Attitude/Demeanor/Rapport:    Affect (typically observed):  Pleasant, Calm Orientation:  Oriented to Self, Oriented to Place, Oriented to  Time,  Oriented to Situation Alcohol / Substance use:  Not Applicable Psych involvement (Current and /or in the community):  No (Comment)  Discharge Needs  Concerns to be addressed:  Discharge Planning Concerns Readmission within the last 30 days:  No Current discharge risk:  Dependent with Mobility Barriers to Discharge:  Continued Medical Work up   [employer], LCSW 06/19/2017, 11:42 AM

## 2017-06-19 NOTE — Clinical Social Work Placement (Signed)
   CLINICAL SOCIAL WORK PLACEMENT  NOTE  Date:  06/19/2017  Patient Details  Name: Zoe [name] MRN: [id number] Date of Birth: [birth year]  Clinical Social Work is seeking post-discharge placement for this patient at the [place] level of care (*CSW will initial, date and re-position this form in  chart as items are completed):      Patient/family provided with [facility] Work Department's list of facilities offering this level of care within the geographic area requested by the patient (or if unable, by the patient's family).      Patient/family informed of their freedom to choose among providers that offer the needed level of care, that participate in Medicare, Medicaid or managed care program needed by the patient, have an available bed and are willing to accept the patient.      Patient/family informed of [facility]'s ownership interest in [facility] and [facility], as well as of the fact that they are under no obligation to receive care at these facilities.  PASRR submitted to EDS on       PASRR number received on       Existing PASRR number confirmed on 06/19/17     FL2 transmitted to all facilities in geographic area requested by pt/family on       FL2 transmitted to all facilities within larger geographic area on       Patient informed that his/her managed care company has contracts with or will negotiate with certain facilities, including the following:  [facility]     Yes   Patient/family informed of bed offers received.  Patient chooses bed at [facility] recommends and patient chooses bed at      Patient to be transferred to [facility] [place] on  .  Patient to be transferred to facility by Spouse will transport.      Patient family notified on   of transfer.  Name of family member notified:  Spouse     PHYSICIAN Please prepare priority discharge summary, including  medications, Please sign FL2     Additional Comment:    _______________________________________________ Lia Hopping, LCSW 06/19/2017, 1:13 PM

## 2017-06-19 NOTE — Progress Notes (Signed)
Patient ID: Zoe [name], female   DOB: 02/24/1934, 83 y.o.   MRN: [id number] Subjective: 1 Day Post-Op Procedure(s) (LRB): RIGHT TOTAL KNEE ARTHROPLASTY (Right)    Patient reports pain as moderate. No events over night.  Planning for ST SNF due to lack substantial support at home for safe home discharge  Objective:   VITALS:   Vitals:   06/19/17 0113 06/19/17 0516  BP: (!) 149/82 118/85  Pulse: 72 72  Resp: 18 17  Temp: 98.3 F (36.8 C) 98 F (36.7 C)  SpO2: 98% 99%    Neurovascular intact Incision: dressing C/D/I  Able to due straight leg raise already  LABS  Recent Labs  06/19/17 0519  HGB 8.6*  HCT 26.8*  WBC 13.5*  PLT 176     Recent Labs  06/19/17 0519  NA 131*  K 3.9  BUN 17  CREATININE 0.76  GLUCOSE 119*    No results for input(s): LABPT, INR in the last 72 hours.   Assessment/Plan: 1 Day Post-Op Procedure(s) (LRB): RIGHT TOTAL KNEE ARTHROPLASTY (Right)   Advance diet Up with therapy Plan for discharge tomorrow Discharge to SNF   Reviewed plan and goals as she has successfully been through arthroplasty before

## 2017-06-19 NOTE — Progress Notes (Signed)
Plan for d/c to SNF, discharge planning per CSW. [phone] 

## 2017-06-19 NOTE — Anesthesia Postprocedure Evaluation (Signed)
Anesthesia Post Note  Patient: Zoe [name]  Procedure(s) Performed: Procedure(s) (LRB): RIGHT TOTAL KNEE ARTHROPLASTY (Right)     Patient location during evaluation: PACU Anesthesia Type: Spinal Level of consciousness: oriented and awake and alert Pain management: pain level controlled Vital Signs Assessment: post-procedure vital signs reviewed and stable Respiratory status: spontaneous breathing, respiratory function stable and patient connected to nasal cannula oxygen Cardiovascular status: blood pressure returned to baseline and stable Postop Assessment: no headache and no backache Anesthetic complications: no    Last Vitals:  Vitals:   06/19/17 0921 06/19/17 1433  BP: (!) 118/54 (!) 134/58  Pulse: 74 81  Resp: 18 16  Temp: 37 C 36.8 C  SpO2: 96% 93%    Last Pain:  Vitals:   06/19/17 1433  TempSrc: Oral  PainSc:                  Aubriana Ravelo,JAMES TERRILL

## 2017-06-19 NOTE — Progress Notes (Signed)
Physical Therapy Treatment Patient Details Name: Zoe [name] MRN: [id number] DOB: 02/01/1934 Today's Date: 06/19/2017    History of Present Illness 46 yr s/p RTKA , with recent [place] 1 year ago. Also 6 moths ago according to pt she had a compression fracture of L3 and had kyphoplasty performed.     PT Comments    Pt progressing well with mobility, she ambulated 180' with RW. Instructed pt in TKA HEP, she demonstrates good understanding.   Follow Up Recommendations  DC plan and follow up therapy as arranged by surgeon (DC to SNF per pt)     Equipment Recommendations  Rolling walker with 5" wheels    Recommendations for Other Services       Precautions / Restrictions Precautions Precautions: None;Knee Required Braces or Orthoses: Knee Immobilizer - Right Knee Immobilizer - Right: Discontinue once straight leg raise with < 10 degree lag Restrictions Weight Bearing Restrictions: No    Mobility  Bed Mobility               General bed mobility comments: up in recliner  Transfers Overall transfer level: Needs assistance Equipment used: Rolling walker (2 wheeled) Transfers: Sit to/from Stand Sit to Stand: Min assist         General transfer comment: cues for safety of RW use and hand placement   Ambulation/Gait Ambulation/Gait assistance: Min guard Ambulation Distance (Feet): 180 Feet Assistive device: Rolling walker (2 wheeled) Gait Pattern/deviations: Step-to pattern     General Gait Details: VCs posture, steady with RW, no LOB   Stairs            Wheelchair Mobility    Modified Rankin (Stroke Patients Only)       Balance Overall balance assessment: Needs assistance Sitting-balance support: Feet supported Sitting balance-Leahy Scale: Good     Standing balance support: Bilateral upper extremity supported                                Cognition Arousal/Alertness: Awake/alert Behavior During Therapy: WFL for tasks  assessed/performed Overall Cognitive Status: Within Functional Limits for tasks assessed                                        Exercises Total Joint Exercises Ankle Circles/Pumps: Both;10 reps;Supine;AROM Quad Sets: AROM;Supine;Right;5 reps Short Arc Quad: AROM;Right;10 reps;Supine Heel Slides: AAROM;Supine;Right;10 reps Straight Leg Raises: Supine;Right;AROM;10 reps Long Arc Quad: AROM;Right;10 reps;Seated Knee Flexion: AROM;AAROM;Right;10 reps;Seated Goniometric ROM: 0-75* AAROM R knee    General Comments        Pertinent Vitals/Pain Pain Score: 5  Pain Location: R knee  Pain Descriptors / Indicators: Sore Pain Intervention(s): Limited activity within patient's tolerance;Monitored during session;Premedicated before session;Ice applied    Home Living                      Prior Function            PT Goals (current goals can now be found in the care plan section) Acute Rehab PT Goals Patient Stated Goal: work in garden PT Goal Formulation: With patient Time For Goal Achievement: 06/25/17 Potential to Achieve Goals: Good Progress towards PT goals: Progressing toward goals    Frequency    7X/week      PT Plan Current plan remains appropriate    Co-evaluation  AM-PAC PT "6 Clicks" Daily Activity  Outcome Measure  Difficulty turning over in bed (including adjusting bedclothes, sheets and blankets)?: A Lot Difficulty moving from lying on back to sitting on the side of the bed? : A Lot Difficulty sitting down on and standing up from a chair with arms (e.g., wheelchair, bedside commode, etc,.)?: A Lot Help needed moving to and from a bed to chair (including a wheelchair)?: A Little Help needed walking in hospital room?: A Little Help needed climbing 3-5 steps with a railing? : A Lot 6 Click Score: 14    End of Session Equipment Utilized During Treatment: Gait belt Activity Tolerance: Patient tolerated treatment  well Patient left: in chair;with call bell/phone within reach Nurse Communication: Mobility status PT Visit Diagnosis: Difficulty in walking, not elsewhere classified (R26.2);Pain Pain - Right/Left: Right Pain - part of body: Knee     Time: 7322-0254 PT Time Calculation (min) (ACUTE ONLY): 39 min  Charges:  $Gait Training: 8-22 mins $Therapeutic Exercise: 8-22 mins $Therapeutic Activity: 8-22 mins                    G Codes:          Philomena Doheny 06/19/2017, 10:07 AM [phone]

## 2017-06-19 NOTE — Progress Notes (Signed)
Physical Therapy Treatment Patient Details Name: Zoe [name] MRN: [id number] DOB: 02-18-1934 Today's Date: 06/19/2017    History of Present Illness 18 yr s/p RTKA , with recent [place] 1 year ago. Also 6 moths ago according to pt she had a compression fracture of L3 and had kyphoplasty performed.     PT Comments    Pt progressing well with mobility. Expect she will be ready to DC tomorrow from PT standpoint.   Follow Up Recommendations  DC plan and follow up therapy as arranged by surgeon (DC to SNF per pt)     Equipment Recommendations  Rolling walker with 5" wheels    Recommendations for Other Services       Precautions / Restrictions Precautions Precautions: None;Knee Required Braces or Orthoses: Knee Immobilizer - Right Knee Immobilizer - Right: Discontinue once straight leg raise with < 10 degree lag Restrictions Weight Bearing Restrictions: No    Mobility  Bed Mobility               General bed mobility comments: up in recliner  Transfers Overall transfer level: Needs assistance Equipment used: Rolling walker (2 wheeled) Transfers: Sit to/from Stand Sit to Stand: Min guard         General transfer comment: cues for safety of RW use and hand placement   Ambulation/Gait Ambulation/Gait assistance: Min guard Ambulation Distance (Feet): 180 Feet Assistive device: Rolling walker (2 wheeled) Gait Pattern/deviations: Step-to pattern     General Gait Details: VCs posture, steady with RW, no LOB   Stairs            Wheelchair Mobility    Modified Rankin (Stroke Patients Only)       Balance Overall balance assessment: Needs assistance Sitting-balance support: Feet supported Sitting balance-Leahy Scale: Good     Standing balance support: Bilateral upper extremity supported Standing balance-Leahy Scale: Fair                              Cognition Arousal/Alertness: Awake/alert Behavior During Therapy: WFL for tasks  assessed/performed Overall Cognitive Status: Within Functional Limits for tasks assessed                                        Exercises Total Joint Exercises Ankle Circles/Pumps: Both;10 reps;Supine;AROM Heel Slides: AAROM;Supine;Right;10 reps    General Comments        Pertinent Vitals/Pain Pain Score: 3  Pain Location: R knee  Pain Descriptors / Indicators: Sore Pain Intervention(s): Limited activity within patient's tolerance;Monitored during session;Premedicated before session;Ice applied    Home Living                      Prior Function            PT Goals (current goals can now be found in the care plan section) Acute Rehab PT Goals Patient Stated Goal: work in garden PT Goal Formulation: With patient Time For Goal Achievement: 06/25/17 Potential to Achieve Goals: Good Progress towards PT goals: Progressing toward goals    Frequency    7X/week      PT Plan Current plan remains appropriate    Co-evaluation              AM-PAC PT "6 Clicks" Daily Activity  Outcome Measure  Difficulty turning over in bed (including adjusting bedclothes, sheets  and blankets)?: A Lot Difficulty moving from lying on back to sitting on the side of the bed? : A Lot Difficulty sitting down on and standing up from a chair with arms (e.g., wheelchair, bedside commode, etc,.)?: A Lot Help needed moving to and from a bed to chair (including a wheelchair)?: A Little Help needed walking in hospital room?: A Little Help needed climbing 3-5 steps with a railing? : A Lot 6 Click Score: 14    End of Session Equipment Utilized During Treatment: Gait belt Activity Tolerance: Patient tolerated treatment well Patient left: in chair;with call bell/phone within reach Nurse Communication: Mobility status PT Visit Diagnosis: Difficulty in walking, not elsewhere classified (R26.2);Pain Pain - Right/Left: Right Pain - part of body: Knee     Time:  0722-5750 PT Time Calculation (min) (ACUTE ONLY): 38 min  Charges:  $Gait Training: 8-22 mins $Therapeutic Exercise: 8-22 mins $Therapeutic Activity: 8-22 mins                    G Codes:          Philomena Doheny 06/19/2017, 2:41 PM [phone]

## 2017-06-19 NOTE — NC FL2 (Signed)
[place] LEVEL OF CARE SCREENING TOOL     IDENTIFICATION  Patient Name: Zoe [name] Birthdate: 1934/04/05 Sex: female Admission Date (Current Location): 06/18/2017  [facility] and [place] Number:  Herbalist and Address:  [facility],  [place] [address], [place]      Provider Number: [id number]  Attending Physician Name and Address:  Paralee Cancel, MD  Relative Name and Phone Number:       Current Level of Care: Hospital Recommended Level of Care: [place] Prior Approval Number:    Date Approved/Denied:   PASRR Number: [id number]  Discharge Plan: SNF    Current Diagnoses: Patient Active Problem List   Diagnosis Date Noted  . S/P right TKA 06/18/2017  . Obese 03/15/2016  . S/P left TKA 03/13/2016  . History of breast cancer, DCIS, lumpectomy March 2000 12/13/2011    Orientation RESPIRATION BLADDER Height & Weight     Self, Time, Situation, Place  Normal Indwelling catheter Weight: 179 lb (81.2 kg) Height:  5\' 4"  (162.6 cm)  BEHAVIORAL SYMPTOMS/MOOD NEUROLOGICAL BOWEL NUTRITION STATUS      Continent Diet (regular diet, thin fluid consistency)  AMBULATORY STATUS COMMUNICATION OF NEEDS Skin   Extensive Assist Verbally Surgical wounds  closed incision right knee, compression wrap                     Personal Care Assistance Level of Assistance  Bathing, Feeding, Dressing Bathing Assistance: Limited assistance Feeding assistance: Independent Dressing Assistance: Independent     Functional Limitations Info  Sight, Hearing, Speech Sight Info: Adequate Hearing Info: Adequate Speech Info: Adequate    SPECIAL CARE FACTORS FREQUENCY  PT (By licensed PT), OT (By licensed OT)     PT Frequency: 5x OT Frequency: 5x            Contractures Contractures Info: Not present    Additional Factors Info  Code Status, Allergies Code Status Info: full code Allergies Info: penicillins, sulfur            Current Medications (06/19/2017):  This is the current hospital active medication list Current Facility-Administered Medications  Medication Dose Route Frequency Provider Last Rate Last Dose  . 0.9 %  sodium chloride infusion   Intravenous Continuous Danae Orleans, PA-C 100 mL/hr at 06/19/17 0054    . alum & mag hydroxide-simeth (MAALOX/MYLANTA) 200-200-20 MG/5ML suspension 15 mL  15 mL Oral Q4H PRN Danae Orleans, PA-C      . aspirin chewable tablet 81 mg  81 mg Oral BID Danae Orleans, PA-C   81 mg at 06/19/17 0853  . bisacodyl (DULCOLAX) suppository 10 mg  10 mg Rectal Daily PRN Babish, Matthew, PA-C      . carvedilol (COREG) tablet 6.25 mg  6.25 mg Oral BID Babish, Matthew, PA-C   6.25 mg at 06/18/17 2229  . celecoxib (CELEBREX) capsule 200 mg  200 mg Oral Q12H Danae Orleans, PA-C   200 mg at 06/18/17 2229  . diphenhydrAMINE (BENADRYL) capsule 25 mg  25 mg Oral Q6H PRN Danae Orleans, PA-C   25 mg at 06/19/17 0525  . docusate sodium (COLACE) capsule 100 mg  100 mg Oral BID Danae Orleans, PA-C   100 mg at 06/18/17 2229  . fentaNYL (SUBLIMAZE) injection 25-50 mcg  25-50 mcg Intravenous Q2H PRN Danae Orleans, PA-C   50 mcg at 06/19/17 0100  . ferrous sulfate tablet 325 mg  325 mg Oral TID PC Danae Orleans, PA-C  325 mg at 06/19/17 0853  . hydrochlorothiazide (HYDRODIURIL) tablet 25 mg  25 mg Oral Daily Danae Orleans, PA-C   25 mg at 06/18/17 1438  . HYDROcodone-acetaminophen (NORCO) 7.5-325 MG per tablet 1-2 tablet  1-2 tablet Oral Q4H Danae Orleans, PA-C   2 tablet at 06/19/17 [phone]  . levothyroxine (SYNTHROID, LEVOTHROID) tablet 100 mcg  100 mcg Oral QAC breakfast Danae Orleans, PA-C   100 mcg at 06/19/17 1062  . losartan (COZAAR) tablet 25 mg  25 mg Oral Daily Danae Orleans, PA-C   25 mg at 06/18/17 1438  . magnesium citrate solution 1 Bottle  1 Bottle Oral Once PRN Babish, Matthew, PA-C      . menthol-cetylpyridinium (CEPACOL) lozenge 3 mg  1 lozenge Oral PRN  Danae Orleans, PA-C       Or  . phenol (CHLORASEPTIC) mouth spray 1 spray  1 spray Mouth/Throat PRN Danae Orleans, PA-C      . methocarbamol (ROBAXIN) tablet 500 mg  500 mg Oral Q6H PRN Danae Orleans, PA-C   500 mg at 06/19/17 6948   Or  . methocarbamol (ROBAXIN) 500 mg in dextrose 5 % 50 mL IVPB  500 mg Intravenous Q6H PRN Danae Orleans, PA-C 110 mL/hr at 06/18/17 1144 500 mg at 06/18/17 1144  . metoCLOPramide (REGLAN) tablet 5-10 mg  5-10 mg Oral Q8H PRN Danae Orleans, PA-C       Or  . metoCLOPramide (REGLAN) injection 5-10 mg  5-10 mg Intravenous Q8H PRN Babish, Matthew, PA-C      . omeprazole (PRILOSEC) capsule 20 mg  20 mg Oral Daily Paralee Cancel, MD      . ondansetron [facility]) tablet 4 mg  4 mg Oral Q6H PRN Danae Orleans, PA-C       Or  . ondansetron [facility]) injection 4 mg  4 mg Intravenous Q6H PRN Babish, Matthew, PA-C      . polyethylene glycol (MIRALAX / GLYCOLAX) packet 17 g  17 g Oral BID Danae Orleans, PA-C   17 g at 06/18/17 2229  . pravastatin (PRAVACHOL) tablet 20 mg  20 mg Oral QHS Babish, Matthew, PA-C   20 mg at 06/18/17 2229     Discharge Medications: Please see discharge summary for a list of discharge medications.  Relevant Imaging Results:  Relevant Lab Results:   Additional Information SS # [id number]  Nila Nephew, LCSW

## 2017-06-19 NOTE — Progress Notes (Signed)
OT Cancellation Note  Patient Details Name: Zoe [name] MRN: [id number] DOB: 04-19-1934   Cancelled Treatment:    Reason Eval/Treat Not Completed: Other (comment). Pt plans snf; will defer OT to that venue.  Ethelene Closser 06/19/2017, 2:34 PM  Lesle Chris, OTR/L [phone] 06/19/2017

## 2017-06-20 DIAGNOSIS — M1711 Unilateral primary osteoarthritis, right knee: Secondary | ICD-10-CM | POA: Diagnosis not present

## 2017-06-20 DIAGNOSIS — Z96651 Presence of right artificial knee joint: Secondary | ICD-10-CM | POA: Diagnosis not present

## 2017-06-20 DIAGNOSIS — R011 Cardiac murmur, unspecified: Secondary | ICD-10-CM | POA: Diagnosis not present

## 2017-06-20 DIAGNOSIS — K219 Gastro-esophageal reflux disease without esophagitis: Secondary | ICD-10-CM | POA: Diagnosis not present

## 2017-06-20 DIAGNOSIS — Z87442 Personal history of urinary calculi: Secondary | ICD-10-CM | POA: Diagnosis not present

## 2017-06-20 DIAGNOSIS — M1990 Unspecified osteoarthritis, unspecified site: Secondary | ICD-10-CM | POA: Diagnosis not present

## 2017-06-20 DIAGNOSIS — Z853 Personal history of malignant neoplasm of breast: Secondary | ICD-10-CM | POA: Diagnosis not present

## 2017-06-20 DIAGNOSIS — I999 Unspecified disorder of circulatory system: Secondary | ICD-10-CM | POA: Diagnosis not present

## 2017-06-20 DIAGNOSIS — Z471 Aftercare following joint replacement surgery: Secondary | ICD-10-CM | POA: Diagnosis not present

## 2017-06-20 DIAGNOSIS — H269 Unspecified cataract: Secondary | ICD-10-CM | POA: Diagnosis not present

## 2017-06-20 DIAGNOSIS — E039 Hypothyroidism, unspecified: Secondary | ICD-10-CM | POA: Diagnosis not present

## 2017-06-20 DIAGNOSIS — I10 Essential (primary) hypertension: Secondary | ICD-10-CM | POA: Diagnosis not present

## 2017-06-20 DIAGNOSIS — H3530 Unspecified macular degeneration: Secondary | ICD-10-CM | POA: Diagnosis not present

## 2017-06-20 LAB — BASIC METABOLIC PANEL
Anion gap: 6 (ref 5–15)
BUN: 24 mg/dL — AB (ref 6–20)
CALCIUM: 8.1 mg/dL — AB (ref 8.9–10.3)
CO2: 25 mmol/L (ref 22–32)
CREATININE: 0.73 mg/dL (ref 0.44–1.00)
Chloride: 99 mmol/L — ABNORMAL LOW (ref 101–111)
GFR calc Af Amer: >60 mL/min (ref >60)
GLUCOSE: 113 mg/dL — AB (ref 65–99)
Potassium: 4.1 mmol/L (ref 3.5–5.1)
Sodium: 130 mmol/L — ABNORMAL LOW (ref 135–145)

## 2017-06-20 LAB — CBC
HEMATOCRIT: 25.8 % — AB (ref 36.0–46.0)
Hemoglobin: 8.4 g/dL — ABNORMAL LOW (ref 12.0–15.0)
MCH: 27.2 pg (ref 26.0–34.0)
MCHC: 32.6 g/dL (ref 30.0–36.0)
MCV: 83.5 fL (ref 78.0–100.0)
PLATELETS: 168 10*3/uL (ref 150–400)
RBC: 3.09 MIL/uL — ABNORMAL LOW (ref 3.87–5.11)
RDW: 14.5 % (ref 11.5–15.5)
WBC: 11.9 10*3/uL — ABNORMAL HIGH (ref 4.0–10.5)

## 2017-06-20 NOTE — Progress Notes (Signed)
D/C Summary sent via [facility] to [facility]. Patient spouse plans to transport. Nurse please call report to: [phone] D/c packet left at on pt. Chart.   Kathrin Greathouse, Latanya Presser, MSW Clinical Social Worker 5E and Psychiatric Service Line [phone] 06/20/2017  9:17 AM

## 2017-06-20 NOTE — Progress Notes (Signed)
Report given to RN at [facility] at 11:09 AM. All questions were answered.

## 2017-06-20 NOTE — Discharge Summary (Signed)
Physician Discharge Summary  Patient ID: Zoe [name] MRN: [id number] DOB/AGE: 1934/02/16 83 y.o.  Admit date: 06/18/2017 Discharge date:  8/29/201  Procedures:  Procedure(s) (LRB): RIGHT TOTAL KNEE ARTHROPLASTY (Right)  Attending Physician:  Dr. Paralee Cancel   Admission Diagnoses:   Right knee primary OA / pain  Discharge Diagnoses:  Principal Problem:   S/P right TKA  Past Medical History:  Diagnosis Date  . Arthritis    some - per patient  . Cancer [facility])    breast cancer / left   . Cataract    bilat   . GERD (gastroesophageal reflux disease)   . Heart murmur   . History of bronchitis   . History of kidney stones   . Hypertension   . Hypothyroidism   . Macular degeneration    Left  . Nasal congestion   . Shortness of breath dyspnea    increased exertion   . Stress incontinence   . Thyroid disease   . Tinnitus   . Wears glasses     HPI:    [name], 83 y.o. female, has a history of pain and functional disability in the right knee due to arthritis and has failed non-surgical conservative treatments for greater than 12 weeks to include NSAID's and/or analgesics, use of assistive devices and activity modification.  Onset of symptoms was gradual, starting 2+ years ago with gradually worsening course since that time. The patient noted prior procedures on the knee to include  arthroscopy and menisectomy on the right knee(s).  Patient currently rates pain in the right knee(s) at 8 out of 10 with activity. Patient has worsening of pain with activity and weight bearing, pain that interferes with activities of daily living, pain with passive range of motion, crepitus and joint swelling.  Patient has evidence of periarticular osteophytes and joint space narrowing by imaging studies.  There is no active infection.  Risks, benefits and expectations were discussed with the patient.  Risks including but not limited to the risk of anesthesia, blood clots, nerve  damage, blood vessel damage, failure of the prosthesis, infection and up to and including death.  Patient understand the risks, benefits and expectations and wishes to proceed with surgery.   PCP: Jani Gravel, MD   Discharged Condition: good  Hospital Course:  Patient underwent the above stated procedure on 06/18/2017. Patient tolerated the procedure well and brought to the recovery room in good condition and subsequently to the floor.  POD #1 BP: 118/85 ; Pulse: 72 ; Temp: 98 F (36.7 C) ; Resp: 17 Patient reports pain as moderate. No events over night.  Planning for ST SNF due to lack substantial support at home for safe home discharge. Neurovascular intact and incision: dressing C/D/I.  Able to due straight leg raise already.  LABS  Basename    HGB     8.6  HCT     26.8   POD #2  BP: 125/5 ; Pulse: 80 ; Temp: 97.8 F (36.6 C) ; Resp: 16 Patient reports pain as mild, pain controlled. No events throughout the night. Feels that she is working well with PT. Ready to be discharged to skilled nursing facility. Dorsiflexion/plantar flexion intact, incision: dressing C/D/I, no cellulitis present and compartment soft.   LABS  Basename    HGB     8.4  HCT     25.8    Discharge Exam: General appearance: alert, cooperative and no distress Extremities: Homans sign is negative, no sign of  DVT, no edema, redness or tenderness in the calves or thighs and no ulcers, gangrene or trophic changes  Disposition: Home with follow up in 2 weeks   Follow-up Information    Paralee Cancel, MD. Schedule an appointment as soon as possible for a visit in 2 week(s).   Specialty:  Orthopedic Surgery Contact information: [address] [place] [address] [phone]           Discharge Instructions    Call MD / Call 911    Complete by:  As directed    If you experience chest pain or shortness of breath, CALL 911 and be transported to the hospital emergency room.  If you  develope a fever above 101 F, pus (white drainage) or increased drainage or redness at the wound, or calf pain, call your surgeon's office.   Change dressing    Complete by:  As directed    Maintain surgical dressing until follow up in the clinic. If the edges start to pull up, may reinforce with tape. If the dressing is no longer working, may remove and cover with gauze and tape, but must keep the area dry and clean.  Call with any questions or concerns.   Constipation Prevention    Complete by:  As directed    Drink plenty of fluids.  Prune juice may be helpful.  You may use a stool softener, such as Colace (over the counter) 100 mg twice a day.  Use MiraLax (over the counter) for constipation as needed.   Diet - low sodium heart healthy    Complete by:  As directed    Discharge instructions    Complete by:  As directed    Maintain surgical dressing until follow up in the clinic. If the edges start to pull up, may reinforce with tape. If the dressing is no longer working, may remove and cover with gauze and tape, but must keep the area dry and clean.  Follow up in 2 weeks at [facility]. Call with any questions or concerns.   Increase activity slowly as tolerated    Complete by:  As directed    Weight bearing as tolerated with assist device (walker, cane, etc) as directed, use it as long as suggested by your surgeon or therapist, typically at least 4-6 weeks.   TED hose    Complete by:  As directed    Use stockings (TED hose) for 2 weeks on both leg(s).  You may remove them at night for sleeping.      Allergies as of 06/20/2017      Reactions   Penicillins Other (See Comments)   Patient does not remember reaction. Happened many years ago. Has patient had a PCN reaction causing immediate rash, facial/tongue/throat swelling, SOB or lightheadedness with hypotension: no Has patient had a PCN reaction causing severe rash involving mucus membranes or skin necrosis: no Has patient  had a PCN reaction that required hospitalization no Has patient had a PCN reaction occurring within the last 10 years: no If all of the above answers are "NO", then may proceed with Cephalosporin use.   Sulfur Other (See Comments)   Alters vision      Medication List    STOP taking these medications   aspirin EC 81 MG tablet Replaced by:  aspirin 81 MG chewable tablet     TAKE these medications   aspirin 81 MG chewable tablet Commonly known as:  ASPIRIN CHILDRENS Chew 1 tablet (81 mg  total) by mouth 2 (two) times daily. Take for 4 weeks, then resume regular dose. Replaces:  aspirin EC 81 MG tablet   carvedilol 6.25 MG tablet Commonly known as:  COREG Take 6.25 mg by mouth 2 (two) times daily.   docusate sodium 100 MG capsule Commonly known as:  COLACE Take 1 capsule (100 mg total) by mouth 2 (two) times daily.   ferrous sulfate 325 (65 FE) MG tablet Commonly known as:  FERROUSUL Take 1 tablet (325 mg total) by mouth 3 (three) times daily with meals.   hydrochlorothiazide 25 MG tablet Commonly known as:  HYDRODIURIL Take 25 mg by mouth daily.   HYDROcodone-acetaminophen 7.5-325 MG tablet Commonly known as:  NORCO Take 1-2 tablets by mouth every 4 (four) hours as needed for moderate pain or severe pain.   ICAPS AREDS 2 PO Take 1 tablet by mouth 2 (two) times daily.   levothyroxine 100 MCG tablet Commonly known as:  SYNTHROID, LEVOTHROID Take 100 mcg by mouth daily before breakfast.   losartan 25 MG tablet Commonly known as:  COZAAR Take 25 mg by mouth daily.   methocarbamol 500 MG tablet Commonly known as:  ROBAXIN Take 1 tablet (500 mg total) by mouth every 6 (six) hours as needed for muscle spasms.   omeprazole 20 MG capsule Commonly known as:  PRILOSEC Take 20 mg by mouth daily.   OVER THE COUNTER MEDICATION Take by mouth 2 (two) times daily.   polyethylene glycol packet Commonly known as:  MIRALAX / GLYCOLAX Take 17 g by mouth 2 (two) times daily.     pravastatin 20 MG tablet Commonly known as:  PRAVACHOL Take 20 mg by mouth at bedtime.   SOOTHE 0.6-0.6 % Soln Generic drug:  Propylene Glycol-Glycerin Apply 1 drop to eye daily.   Vitamin D3 2000 units Tabs Take 2,000 Units by mouth daily.            Discharge Care Instructions        Start     Ordered   06/20/17 0000  Call MD / Call 911    Comments:  If you experience chest pain or shortness of breath, CALL 911 and be transported to the hospital emergency room.  If you develope a fever above 101 F, pus (white drainage) or increased drainage or redness at the wound, or calf pain, call your surgeon's office.   06/20/17 3790   06/20/17 0000  Discharge instructions    Comments:  Maintain surgical dressing until follow up in the clinic. If the edges start to pull up, may reinforce with tape. If the dressing is no longer working, may remove and cover with gauze and tape, but must keep the area dry and clean.  Follow up in 2 weeks at [facility]. Call with any questions or concerns.   06/20/17 0832   06/20/17 0000  Diet - low sodium heart healthy     06/20/17 0832   06/20/17 0000  Constipation Prevention    Comments:  Drink plenty of fluids.  Prune juice may be helpful.  You may use a stool softener, such as Colace (over the counter) 100 mg twice a day.  Use MiraLax (over the counter) for constipation as needed.   06/20/17 0832   06/20/17 0000  Increase activity slowly as tolerated    Comments:  Weight bearing as tolerated with assist device (walker, cane, etc) as directed, use it as long as suggested by your surgeon or therapist, typically at least 4-6 weeks.  06/20/17 0832   06/20/17 0000  TED hose    Comments:  Use stockings (TED hose) for 2 weeks on both leg(s).  You may remove them at night for sleeping.   06/20/17 0832   06/20/17 0000  Change dressing    Comments:  Maintain surgical dressing until follow up in the clinic. If the edges start to pull up, may  reinforce with tape. If the dressing is no longer working, may remove and cover with gauze and tape, but must keep the area dry and clean.  Call with any questions or concerns.   06/20/17 0832   06/18/17 0000  aspirin (ASPIRIN CHILDRENS) 81 MG chewable tablet  2 times daily    Question:  Supervising Provider  Answer:  Paralee Cancel   06/18/17 0933   06/18/17 0000  docusate sodium (COLACE) 100 MG capsule  2 times daily    Question:  Supervising Provider  Answer:  Paralee Cancel   06/18/17 0933   06/18/17 0000  ferrous sulfate (FERROUSUL) 325 (65 FE) MG tablet  3 times daily with meals    Question:  Supervising Provider  Answer:  Paralee Cancel   06/18/17 0933   06/18/17 0000  polyethylene glycol (MIRALAX / Floria Raveling) packet  2 times daily    Question:  Supervising Provider  Answer:  Paralee Cancel   06/18/17 0933   06/18/17 0000  methocarbamol (ROBAXIN) 500 MG tablet  Every 6 hours PRN    Question:  Supervising Provider  Answer:  Paralee Cancel   06/18/17 0933   06/18/17 0000  HYDROcodone-acetaminophen (NORCO) 7.5-325 MG tablet  Every 4 hours PRN    Question:  Supervising Provider  Answer:  Paralee Cancel   06/18/17 0933       Signed: [place]. Haniyah Maciolek   PA-C  06/20/2017, 8:33 AM

## 2017-06-20 NOTE — Progress Notes (Signed)
Physical Therapy Treatment Patient Details Name: Zoe [name] MRN: [id number] DOB: [birth year] Today's Date: 06/20/2017    History of Present Illness 44 yr s/p RTKA , with recent [place] 1 year ago. Also 6 moths ago according to pt she had a compression fracture of L3 and had kyphoplasty performed.     PT Comments    Excellent progress with mobility. Pt notes increased pain in R knee today with walking. She is independent with HEP and ready to DC from PT standpoint.   Follow Up Recommendations  DC plan and follow up therapy as arranged by surgeon (DC to SNF per pt)     Equipment Recommendations  Rolling walker with 5" wheels    Recommendations for Other Services       Precautions / Restrictions Precautions Precautions: None;Knee Required Braces or Orthoses: Knee Immobilizer - Right Knee Immobilizer - Right: Discontinue once straight leg raise with < 10 degree lag Restrictions Weight Bearing Restrictions: No    Mobility  Bed Mobility               General bed mobility comments: up in recliner  Transfers Overall transfer level: Needs assistance Equipment used: Rolling walker (2 wheeled) Transfers: Sit to/from Stand Sit to Stand: Supervision         General transfer comment: supervision for safety  Ambulation/Gait Ambulation/Gait assistance: Supervision Ambulation Distance (Feet): 180 Feet Assistive device: Rolling walker (2 wheeled) Gait Pattern/deviations: Step-to pattern   Gait velocity interpretation: Below normal speed for age/gender General Gait Details: steady, no LOB, distance limited by fatigue/R knee pain   Stairs            Wheelchair Mobility    Modified Rankin (Stroke Patients Only)       Balance Overall balance assessment: Needs assistance Sitting-balance support: Feet supported Sitting balance-Leahy Scale: Good     Standing balance support: Bilateral upper extremity supported Standing balance-Leahy Scale: Fair                              Cognition Arousal/Alertness: Awake/alert Behavior During Therapy: WFL for tasks assessed/performed Overall Cognitive Status: Within Functional Limits for tasks assessed                                        Exercises Total Joint Exercises Ankle Circles/Pumps: Both;10 reps;Supine;AROM Quad Sets: AROM;Supine;Right;5 reps Short Arc Quad: AROM;Right;10 reps;Supine Heel Slides: AAROM;Supine;Right;10 reps Hip ABduction/ADduction: AROM;Right;10 reps;Supine Straight Leg Raises: Supine;Right;AROM;10 reps;AAROM Long Arc Quad: AROM;Right;10 reps;Seated Knee Flexion: AROM;AAROM;Right;10 reps;Seated Goniometric ROM: 0-75* AAROM R knee    General Comments        Pertinent Vitals/Pain Pain Score: 7  Pain Location: R knee  Pain Descriptors / Indicators: Sore Pain Intervention(s): Limited activity within patient's tolerance;Monitored during session;Premedicated before session;Patient requesting pain meds-RN notified;Ice applied    Home Living                      Prior Function            PT Goals (current goals can now be found in the care plan section) Acute Rehab PT Goals Patient Stated Goal: work in garden PT Goal Formulation: With patient Time For Goal Achievement: 06/25/17 Potential to Achieve Goals: Good Progress towards PT goals: Progressing toward goals    Frequency    7X/week  PT Plan Current plan remains appropriate    Co-evaluation              AM-PAC PT "6 Clicks" Daily Activity  Outcome Measure  Difficulty turning over in bed (including adjusting bedclothes, sheets and blankets)?: A Little Difficulty moving from lying on back to sitting on the side of the bed? : A Little Difficulty sitting down on and standing up from a chair with arms (e.g., wheelchair, bedside commode, etc,.)?: A Little Help needed moving to and from a bed to chair (including a wheelchair)?: A Little Help needed walking  in hospital room?: A Little Help needed climbing 3-5 steps with a railing? : A Little 6 Click Score: 18    End of Session Equipment Utilized During Treatment: Gait belt Activity Tolerance: Patient tolerated treatment well Patient left: in chair;with call bell/phone within reach Nurse Communication: Mobility status PT Visit Diagnosis: Difficulty in walking, not elsewhere classified (R26.2);Pain Pain - Right/Left: Right Pain - part of body: Knee     Time: 4982-6415 PT Time Calculation (min) (ACUTE ONLY): 25 min  Charges:  $Gait Training: 8-22 mins $Therapeutic Exercise: 8-22 mins                    G Codes:          Philomena Doheny 06/20/2017, 9:24 AM [phone]

## 2017-06-20 NOTE — Progress Notes (Signed)
     Subjective: 2 Days Post-Op Procedure(s) (LRB): RIGHT TOTAL KNEE ARTHROPLASTY (Right)   Patient reports pain as mild, pain controlled. No events throughout the night. Feels that she is working well with PT. Ready to be discharged to skilled nursing facility.  Objective:   VITALS:   Vitals:   06/19/17 2028 06/20/17 0455  BP: (!) 148/65 (!) 125/50  Pulse: 88 80  Resp: 17 16  Temp: 98 F (36.7 C) 97.8 F (36.6 C)  SpO2: 94% 92%    Dorsiflexion/Plantar flexion intact Incision: dressing C/D/I No cellulitis present Compartment soft  LABS  Recent Labs  06/19/17 0519 06/20/17 0500  HGB 8.6* 8.4*  HCT 26.8* 25.8*  WBC 13.5* 11.9*  PLT 176 168     Recent Labs  06/19/17 0519 06/20/17 0500  NA 131* 130*  K 3.9 4.1  BUN 17 24*  CREATININE 0.76 0.73  GLUCOSE 119* 113*     Assessment/Plan: 2 Days Post-Op Procedure(s) (LRB): RIGHT TOTAL KNEE ARTHROPLASTY (Right)   Up with therapy Discharge to SNF  Follow up in 2 weeks at [facility]. Follow up with OLIN,Asa Fath D in 2 weeks.  Contact information:  [facility] [address], [address] [place]   PAC  06/20/2017, 8:28 AM

## 2017-06-27 DIAGNOSIS — Z96651 Presence of right artificial knee joint: Secondary | ICD-10-CM | POA: Diagnosis not present

## 2017-06-27 DIAGNOSIS — M1711 Unilateral primary osteoarthritis, right knee: Secondary | ICD-10-CM | POA: Diagnosis not present

## 2017-06-27 DIAGNOSIS — I999 Unspecified disorder of circulatory system: Secondary | ICD-10-CM | POA: Diagnosis not present

## 2017-07-06 DIAGNOSIS — I6522 Occlusion and stenosis of left carotid artery: Secondary | ICD-10-CM | POA: Diagnosis not present

## 2017-07-06 DIAGNOSIS — I350 Nonrheumatic aortic (valve) stenosis: Secondary | ICD-10-CM | POA: Diagnosis not present

## 2017-07-06 DIAGNOSIS — I10 Essential (primary) hypertension: Secondary | ICD-10-CM | POA: Diagnosis not present

## 2017-07-10 DIAGNOSIS — M25561 Pain in right knee: Secondary | ICD-10-CM | POA: Diagnosis not present

## 2017-07-16 DIAGNOSIS — Z09 Encounter for follow-up examination after completed treatment for conditions other than malignant neoplasm: Secondary | ICD-10-CM | POA: Diagnosis not present

## 2017-07-16 DIAGNOSIS — Z471 Aftercare following joint replacement surgery: Secondary | ICD-10-CM | POA: Diagnosis not present

## 2017-07-16 DIAGNOSIS — Z96651 Presence of right artificial knee joint: Secondary | ICD-10-CM | POA: Diagnosis not present

## 2017-07-17 DIAGNOSIS — M25561 Pain in right knee: Secondary | ICD-10-CM | POA: Diagnosis not present

## 2017-07-19 DIAGNOSIS — M25561 Pain in right knee: Secondary | ICD-10-CM | POA: Diagnosis not present

## 2017-07-24 DIAGNOSIS — M25561 Pain in right knee: Secondary | ICD-10-CM | POA: Diagnosis not present

## 2017-07-26 DIAGNOSIS — M25561 Pain in right knee: Secondary | ICD-10-CM | POA: Diagnosis not present

## 2017-07-31 DIAGNOSIS — M25561 Pain in right knee: Secondary | ICD-10-CM | POA: Diagnosis not present

## 2017-08-02 DIAGNOSIS — M25561 Pain in right knee: Secondary | ICD-10-CM | POA: Diagnosis not present

## 2017-08-09 DIAGNOSIS — M25561 Pain in right knee: Secondary | ICD-10-CM | POA: Diagnosis not present

## 2017-08-14 DIAGNOSIS — I10 Essential (primary) hypertension: Secondary | ICD-10-CM | POA: Diagnosis not present

## 2017-08-14 DIAGNOSIS — S32030A Wedge compression fracture of third lumbar vertebra, initial encounter for closed fracture: Secondary | ICD-10-CM | POA: Diagnosis not present

## 2017-08-17 DIAGNOSIS — M810 Age-related osteoporosis without current pathological fracture: Secondary | ICD-10-CM | POA: Diagnosis not present

## 2017-08-17 DIAGNOSIS — M25561 Pain in right knee: Secondary | ICD-10-CM | POA: Diagnosis not present

## 2017-08-17 DIAGNOSIS — I10 Essential (primary) hypertension: Secondary | ICD-10-CM | POA: Diagnosis not present

## 2017-08-17 DIAGNOSIS — E7800 Pure hypercholesterolemia, unspecified: Secondary | ICD-10-CM | POA: Diagnosis not present

## 2017-08-17 DIAGNOSIS — Q8789 Other specified congenital malformation syndromes, not elsewhere classified: Secondary | ICD-10-CM | POA: Diagnosis not present

## 2017-08-21 DIAGNOSIS — M25561 Pain in right knee: Secondary | ICD-10-CM | POA: Diagnosis not present

## 2017-08-27 ENCOUNTER — Ambulatory Visit (INDEPENDENT_AMBULATORY_CARE_PROVIDER_SITE_OTHER): Payer: Medicare Other | Admitting: Ophthalmology

## 2017-08-27 DIAGNOSIS — I10 Essential (primary) hypertension: Secondary | ICD-10-CM | POA: Diagnosis not present

## 2017-08-27 DIAGNOSIS — H35033 Hypertensive retinopathy, bilateral: Secondary | ICD-10-CM | POA: Diagnosis not present

## 2017-08-27 DIAGNOSIS — H353132 Nonexudative age-related macular degeneration, bilateral, intermediate dry stage: Secondary | ICD-10-CM

## 2017-08-27 DIAGNOSIS — H35372 Puckering of macula, left eye: Secondary | ICD-10-CM

## 2017-08-27 DIAGNOSIS — H43813 Vitreous degeneration, bilateral: Secondary | ICD-10-CM

## 2017-12-06 DIAGNOSIS — I10 Essential (primary) hypertension: Secondary | ICD-10-CM | POA: Diagnosis not present

## 2017-12-06 DIAGNOSIS — I6523 Occlusion and stenosis of bilateral carotid arteries: Secondary | ICD-10-CM | POA: Diagnosis not present

## 2017-12-06 DIAGNOSIS — I6529 Occlusion and stenosis of unspecified carotid artery: Secondary | ICD-10-CM | POA: Diagnosis not present

## 2017-12-06 DIAGNOSIS — E039 Hypothyroidism, unspecified: Secondary | ICD-10-CM | POA: Diagnosis not present

## 2017-12-06 DIAGNOSIS — R7309 Other abnormal glucose: Secondary | ICD-10-CM | POA: Diagnosis not present

## 2017-12-06 DIAGNOSIS — E559 Vitamin D deficiency, unspecified: Secondary | ICD-10-CM | POA: Diagnosis not present

## 2017-12-13 DIAGNOSIS — R739 Hyperglycemia, unspecified: Secondary | ICD-10-CM | POA: Diagnosis not present

## 2017-12-13 DIAGNOSIS — R0602 Shortness of breath: Secondary | ICD-10-CM | POA: Diagnosis not present

## 2017-12-13 DIAGNOSIS — E039 Hypothyroidism, unspecified: Secondary | ICD-10-CM | POA: Diagnosis not present

## 2017-12-31 DIAGNOSIS — R0602 Shortness of breath: Secondary | ICD-10-CM | POA: Diagnosis not present

## 2017-12-31 DIAGNOSIS — E039 Hypothyroidism, unspecified: Secondary | ICD-10-CM | POA: Diagnosis not present

## 2017-12-31 DIAGNOSIS — I10 Essential (primary) hypertension: Secondary | ICD-10-CM | POA: Diagnosis not present

## 2018-04-04 DIAGNOSIS — Z853 Personal history of malignant neoplasm of breast: Secondary | ICD-10-CM | POA: Diagnosis not present

## 2018-04-10 ENCOUNTER — Ambulatory Visit
Admission: RE | Admit: 2018-04-10 | Discharge: 2018-04-10 | Disposition: A | Discharge status: Home / Self Care | Payer: Medicare Other | Source: Ambulatory Visit | Attending: Surgery | Admitting: Surgery

## 2018-04-10 ENCOUNTER — Other Ambulatory Visit: Payer: Self-pay | Admitting: Surgery

## 2018-04-10 DIAGNOSIS — Z1231 Encounter for screening mammogram for malignant neoplasm of breast: Secondary | ICD-10-CM | POA: Diagnosis not present

## 2018-05-27 ENCOUNTER — Encounter (INDEPENDENT_AMBULATORY_CARE_PROVIDER_SITE_OTHER): Payer: Medicare Other | Admitting: Ophthalmology

## 2018-05-27 DIAGNOSIS — H43813 Vitreous degeneration, bilateral: Secondary | ICD-10-CM

## 2018-05-27 DIAGNOSIS — H35033 Hypertensive retinopathy, bilateral: Secondary | ICD-10-CM

## 2018-05-27 DIAGNOSIS — H353132 Nonexudative age-related macular degeneration, bilateral, intermediate dry stage: Secondary | ICD-10-CM

## 2018-05-27 DIAGNOSIS — I10 Essential (primary) hypertension: Secondary | ICD-10-CM | POA: Diagnosis not present

## 2018-05-27 DIAGNOSIS — H35372 Puckering of macula, left eye: Secondary | ICD-10-CM

## 2018-07-01 DIAGNOSIS — R739 Hyperglycemia, unspecified: Secondary | ICD-10-CM | POA: Diagnosis not present

## 2018-07-01 DIAGNOSIS — Z23 Encounter for immunization: Secondary | ICD-10-CM | POA: Diagnosis not present

## 2018-07-01 DIAGNOSIS — E7800 Pure hypercholesterolemia, unspecified: Secondary | ICD-10-CM | POA: Diagnosis not present

## 2018-07-02 DIAGNOSIS — I350 Nonrheumatic aortic (valve) stenosis: Secondary | ICD-10-CM | POA: Diagnosis not present

## 2018-07-02 DIAGNOSIS — I6523 Occlusion and stenosis of bilateral carotid arteries: Secondary | ICD-10-CM | POA: Diagnosis not present

## 2018-07-08 DIAGNOSIS — I10 Essential (primary) hypertension: Secondary | ICD-10-CM | POA: Diagnosis not present

## 2018-07-08 DIAGNOSIS — I6522 Occlusion and stenosis of left carotid artery: Secondary | ICD-10-CM | POA: Diagnosis not present

## 2018-07-08 DIAGNOSIS — I350 Nonrheumatic aortic (valve) stenosis: Secondary | ICD-10-CM | POA: Diagnosis not present

## 2018-07-10 DIAGNOSIS — E119 Type 2 diabetes mellitus without complications: Secondary | ICD-10-CM | POA: Diagnosis not present

## 2018-07-10 DIAGNOSIS — E7800 Pure hypercholesterolemia, unspecified: Secondary | ICD-10-CM | POA: Diagnosis not present

## 2018-07-10 DIAGNOSIS — Z0000 Encounter for general adult medical examination without abnormal findings: Secondary | ICD-10-CM | POA: Diagnosis not present

## 2018-07-10 DIAGNOSIS — I350 Nonrheumatic aortic (valve) stenosis: Secondary | ICD-10-CM | POA: Diagnosis not present

## 2018-07-17 NOTE — Progress Notes (Signed)
Valve Clinic Consult Note  Chief Complaint  Patient presents with  . New Patient (Initial Visit)    Severe aortic stenosis/TAVR consult   History of Present Illness: 84 yo female with history of breast cancer, GERD, nephrolithiasis, HTN, hyperlipidemia, hypothyroidism, carotid artery disease, and severe aortic stenosis who is referred today to the valve clinic by Dr. Einar Gip for further discussion regarding her aortic stenosis and possible TAVR. She has been followed by Dr. Einar Gip for aortic stenosis for several years. Most recent echo 07/02/18 in Dr. Irven Shelling office showed moderate LVH with normal LV systolic function. LVEF=65%. Mild dilation of the left atrium. The aortic valve is thickened and calcified. Opening of the valve leaflets is restricted. Mean gradient 46 mmHg, peak gradient 69 mmHg, AVA 0.65cm2, mild mitral regurgitation. Cardiac cath in June 2012 with moderate disease in the ostium of the ramus intermediate branch which was felt to be due to spasm. The other vessels had no disease. She had breast cancer 20 years ago and had a lumpectomy.   She tells me today that she has noticed worsening dyspnea with exertion over the past 6 months. She has no chest pain, dizziness, near syncope or syncope. She has no lower extremity edema. She sees a dentist regularly but has a cracked tooth. She is a retired in Engineer, mining. She lives with her husband in a townhouse in [place].   Primary Care Physician: Jani Gravel, MD Primary Cardiologist: Dr. Einar Gip Referring Physiscian: Dr. Einar Gip  Past Medical History:  Diagnosis Date  . Arthritis    some - per patient  . Cancer [facility])    breast cancer / left   . Cataract    bilat   . GERD (gastroesophageal reflux disease)   . Heart murmur   . History of bronchitis   . History of kidney stones   . Hyperlipidemia   . Hypertension   . Hypothyroidism   . Macular degeneration    Left  . Nasal congestion   . Shortness of breath dyspnea    increased  exertion   . Stress incontinence   . Thyroid disease   . Tinnitus   . Wears glasses     Past Surgical History:  Procedure Laterality Date  . ABDOMINAL HYSTERECTOMY  1970's  . BREAST LUMPECTOMY  12/1998   lumpectomy  . CARDIAC CATHETERIZATION    . EYE SURGERY     cataract surgery bilat   . LITHOTRIPSY    . Right total knee     2018 Dr. Alvan Dame  . THYROIDECTOMY, PARTIAL  1975  . TONSILLECTOMY     as a child - patient not sure of exact date  . TOTAL KNEE ARTHROPLASTY Left 03/13/2016   Procedure: TOTAL KNEE ARTHROPLASTY;  Surgeon: Paralee Cancel, MD;  Location: [facility];  Service: Orthopedics;  Laterality: Left;  . TOTAL KNEE ARTHROPLASTY Right 06/18/2017   Procedure: RIGHT TOTAL KNEE ARTHROPLASTY;  Surgeon: Paralee Cancel, MD;  Location: [facility];  Service: Orthopedics;  Laterality: Right;    Current Outpatient Medications  Medication Sig Dispense Refill  . carvedilol (COREG) 12.5 MG tablet Take 12.5 mg by mouth 2 (two) times daily with a meal.    . Cholecalciferol (VITAMIN D3) 2000 units TABS Take 2,000 Units by mouth daily.    . hydrochlorothiazide (HYDRODIURIL) 25 MG tablet Take 25 mg by mouth daily.  4  . levothyroxine (SYNTHROID, LEVOTHROID) 100 MCG tablet Take 100 mcg by mouth daily before breakfast.  2  . losartan (COZAAR) 25 MG tablet  Take 25 mg by mouth daily.     . Multiple Vitamins-Minerals (ICAPS AREDS 2 PO) Take 1 tablet by mouth 2 (two) times daily.    Marland Kitchen omeprazole (PRILOSEC) 20 MG capsule Take 20 mg by mouth daily.    Marland Kitchen OVER THE COUNTER MEDICATION Take by mouth 2 (two) times daily.    . pravastatin (PRAVACHOL) 20 MG tablet Take 20 mg by mouth at bedtime.  2  . Propylene Glycol-Glycerin (SOOTHE) 0.6-0.6 % SOLN Apply 1 drop to eye daily.     No current facility-administered medications for this visit.     Allergies  Allergen Reactions  . Penicillins Other (See Comments)    Patient does not remember reaction. Happened many years ago. Has patient had a PCN reaction causing  immediate rash, facial/tongue/throat swelling, SOB or lightheadedness with hypotension: no Has patient had a PCN reaction causing severe rash involving mucus membranes or skin necrosis: no Has patient had a PCN reaction that required hospitalization no Has patient had a PCN reaction occurring within the last 10 years: no If all of the above answers are "NO", then may proceed with Cephalosporin use.   . Sulfa Antibiotics     Social History   Socioeconomic History  . Marital status: Married    Spouse name: Not on file  . Number of children: 4  . Years of education: Not on file  . Highest education level: Not on file  Occupational History  . 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 Financial resource strain: Not on file  . Food insecurity:    Worry: Not on file    Inability: Not on file  . Transportation needs:    Medical: Not on file    Non-medical: Not on file  Tobacco Use  . Smoking status: Never Smoker  . Smokeless tobacco: Never Used  Substance and Sexual Activity  . Alcohol use: No  . Drug use: No  . Sexual activity: Not Currently  Lifestyle  . Physical activity:    Days per week: Not on file    Minutes per session: Not on file  . Stress: Not on file  Relationships  . Social connections:    Talks on phone: Not on file    Gets together: Not on file    Attends religious service: Not on file    Active member of club or organization: Not on file    Attends meetings of clubs or organizations: Not on file    Relationship status: Not on file  . Intimate partner violence:    Fear of current or ex partner: Not on file    Emotionally abused: Not on file    Physically abused: Not on file    Forced sexual activity: Not on file  Other Topics Concern  . Not on file  Social History Narrative  . Not on file    Family History  Problem Relation Age of Onset  . Diabetes Mother   . Stroke Mother        Carotid artery disease  .  Heart disease Father        CAD    Review of Systems:  As stated in the HPI and otherwise negative.   BP 130/68   Pulse 74   Ht _0  (1.626 m)   Wt 180 lb 12.8 oz (82 kg)   SpO2 97%   BMI 31.03 kg/m   Physical Examination: General: Well developed, well nourished, NAD  HEENT:  OP clear, mucus membranes moist  SKIN: warm, dry. No rashes. Neuro: No focal deficits  Musculoskeletal: Muscle strength 5/5 all ext  Psychiatric: Mood and affect normal  Neck: No JVD, no carotid bruits, no thyromegaly, no lymphadenopathy.  Lungs:Clear bilaterally, no wheezes, rhonci, crackles Cardiovascular: Regular rate and rhythm. Loud harsh systolic murmur.  Abdomen:Soft. Bowel sounds present. Non-tender.  Extremities: No lower extremity edema. Pulses are 2 + in the bilateral DP/PT.  Echo 07/02/18: Normal LV size. Moderate concentric LVH Normal LV systolic function, ZTIW=58% No wall motion abnormalities Mild left atrial enlargement The aortic valve is thickened and calcified. The aortic valve leaflet motion is restricted. Mean gradient 46 mmHg. Peak gradient 69 mmHg. AVA 0.65cm2. Trace AI Mild mitral regurgitation. MAC Mild TR PASP 37 mmHg   EKG:  EKG is ordered today. The ekg ordered today demonstrates NSR, rate 74 bpm. PVC. Non-specific ST and T wave abn  Recent Labs: No results found for requested labs within last 8760 hours.   Lipid Panel No results found for: CHOL, TRIG, HDL, CHOLHDL, VLDL, LDLCALC, LDLDIRECT   Wt Readings from Last 3 Encounters:  07/18/18 180 lb 12.8 oz (82 kg)  06/18/17 179 lb (81.2 kg)  06/11/17 179 lb (81.2 kg)     Other studies Reviewed: Additional studies/ records that were reviewed today include: echo images, office notes. Review of the above records demonstrates: severe AS   Assessment and Plan:   1. Severe aortic valve stenosis: She has severe, stage D aortic valve stenosis. I have personally reviewed the echo images. The aortic valve is thickened,  calcified with limited leaflet mobility. I think she would benefit from AVR. Given advanced age, she is not a good candidate for conventional AVR by surgical approach. I think she may be a good candidate for TAVR.   STS Risk Score: Procedure: Isolated AVR  Risk of Mortality: 3.782%  Renal Failure: 2.526%  Permanent Stroke: 1.977%  Prolonged Ventilation: 10.262%  DSW Infection: 0.083%  Reoperation: 3.988%  Morbidity or Mortality: 16.340%  Short Length of Stay: 24.844%  Long Length of Stay: 8.911%   I have reviewed the natural history of aortic stenosis with the patient and their family members  who are present today. We have discussed the limitations of medical therapy and the poor prognosis associated with symptomatic aortic stenosis. We have reviewed potential treatment options, including palliative medical therapy, conventional surgical aortic valve replacement, and transcatheter aortic valve replacement. We discussed treatment options in the context of the patient's specific comorbid medical conditions.   She would like to proceed with planning for TAVR. I will have Dr. Einar Gip arrange a right and left heart catheterization at [facility] over the next few weeks. Risks and benefits of the TAVR procedure reviewed with the patient. After the cath, she will have a cardiac CT, CTA of the chest/abdomen and pelvis, PFTs, PT assessment and will then be referred to see one of the CT surgeons on our TAVR team.    Current medicines are reviewed at length with the patient today.  The patient does not have concerns regarding medicines.  The following changes have been made:  no change  Labs/ tests ordered today include:  No orders of the defined types were placed in this encounter.    Disposition:   FU with the valve team    Signed, Lauree Chandler, MD 07/18/2018 10:28 AM    [address], [place], [place]  [address] Phone: [phone]; Fax: [phone]

## 2018-07-18 ENCOUNTER — Ambulatory Visit: Payer: Medicare Other | Admitting: Cardiovascular Disease

## 2018-07-18 ENCOUNTER — Encounter: Payer: Self-pay | Admitting: Cardiovascular Disease

## 2018-07-18 VITALS — BP 130/68 | HR 74 | Ht 64.0 in | Wt 180.8 lb

## 2018-07-18 DIAGNOSIS — I350 Nonrheumatic aortic (valve) stenosis: Secondary | ICD-10-CM

## 2018-07-18 NOTE — Patient Instructions (Signed)
Medication Instructions:  Your physician recommends that you continue on your current medications as directed. Please refer to the Current Medication list given to you today.   Labwork: none  Testing/Procedures: none  Follow-Up: We will be in touch with you after catheterization  Any Other Special Instructions Will Be Listed Below (If Applicable).     If you need a refill on your cardiac medications before your next appointment, please call your pharmacy.

## 2018-07-30 ENCOUNTER — Other Ambulatory Visit: Payer: Self-pay

## 2018-07-30 DIAGNOSIS — R0609 Other forms of dyspnea: Secondary | ICD-10-CM

## 2018-07-30 DIAGNOSIS — I10 Essential (primary) hypertension: Secondary | ICD-10-CM | POA: Diagnosis not present

## 2018-07-30 DIAGNOSIS — I350 Nonrheumatic aortic (valve) stenosis: Secondary | ICD-10-CM

## 2018-08-05 DIAGNOSIS — I350 Nonrheumatic aortic (valve) stenosis: Secondary | ICD-10-CM

## 2018-08-05 NOTE — H&P (Signed)
OFFICE VISIT NOTES COPIED TO EPIC FOR DOCUMENTATION  .History of Present Illness [name] MD; 07/08/2018 8:43 PM) Patient words: 1 yr fu for aortic stenosis, carotid stenosis, echo results; last OV 07/06/2017.  The patient is a 84 year old female who presents for a Follow-up for Aortic stenosis.  Additional reasons for visit:  Follow-up for Carotid artery stenosis is described as the following: [name] is a fairly active Asian female with history of possibly mild coronary spasm by coronary angiogram in 2014 when she presented with angina pectoris, otherwise no significant CAD, asymptomatic left carotid artery stenosis, aortic stenosis, essential hypertension, hyperlipidemia, GERD presents here for follow-up of carotid stenosis and aortic stenosis. She underwent right knee arthroplasty in Aug 2018 without any periprocedural complications.   She has noticed gradual worsening dyspnea or the past 4-6 months even doing routine chores like from parking lot to her church which is not a long distance. No associated chest pain, dizziness or syncope. No symptoms to suggest TIA. No recent weight changes, no GI bleed.   Problem List/Past Medical (April Harrington; 07/08/2018 11:34 AM) Laboratory examination (Z01.89)  Labs 05/29/2017: Urine analysis E. coli and Klebsiella pneumonia present. HB 4.4/HCT 38.1, platelets 205, indicis normal. Serum creatinine 0.79, eGFR 70 mL, CMP normal, potassium 4.4. Total cholesterol 123, triglycerides 111, HDL 48, LDL 53. TSH normal.  11/23/2016: RBC 4.19, normal hemoglobin, hematocrit 35.0, CBC otherwise normal. Creatinine 0.78, potassium 4.4, CMP otherwise normal. Cholesterol 134, HDL 54, LDL 63, triglycerides 86. Vitamin D 42. Other premature beats (427.69)  Shortness of breath at rest (R06.02)  Essential hypertension, benign (I10)  EKG 07/06/2017: Normal sinus rhythm at rate of 79 bpm, left atrial enlargement, normal axis. No evidence of  ischemia otherwise normal EKG. Pure hypercholesterolemia (E78.00)  Labs 04/02/2015: CBC normal, serum glucose 105, creatinine 0.66, CMP otherwise normal, total cholesterol 128, triglycerides 61, HDL 56, LDL 60, TSH 0.541, vitamin D 38.5, HbA1c 6.1% Gastroesophageal reflux disease without esophagitis (K21.9)  Coronary artery spasm (I20.1)  Heart cath in 03/2005 (10-20% stenosis-possible coronary spasm RCA). Repeat cath 03/24/11 Ramus intermediate 70% ostial stenosis, otherwise normal. By IVUS No CAD. Mild intramyocardial bridging only, suspect spasm. Stress EKG 02/27/11: Positive stress EKG for ischemia. 7.3 METS. hypertensive BP. Low risk. ST depression persisted for > 4 minutes into recovery., No chest pain. Echo 5.3.12 Normal LVEF. Borderline prolapse of MV with mild MR. Mild aortic sclerosis Moderate aortic stenosis (I35.0)  Echo- 07/02/2018 1. Left ventricle cavity is normal in size. Moderate concentric hypertrophy of the left ventricle. Basal septum is more hypertrophied. Normal global wall motion. Doppler evidence of grade II (pseudonormal) diastolic dysfunction. Calculated EF 65%. 2. Left atrial cavity is mildly dilated. 3. Trace aortic regurgitation. Moderate calcification of the aortic valve annulus. Moderate aortic valve leaflet calcification. Moderate to severely restricted aortic valve leaflets. Severe aortic valve stenosis with peak gradient 69, mean gradient 46 mm od Hg. Calculated valve area is 0.65 cm2. 4. Mild (Grade I) mitral regurgitation. Mild calcification of the mitral valve annulus. 5. Mild tricuspid regurgitation. Mild pulmonary hypertension with approx. PA syst. pressure of 37 mm of Hg. 6. IVC is dilated with respiratory variation. 7. c.f. echo. of 05/01/2017, Aortic stenosis has become severe, it was moderate before. Mild Pul. HTN is new, no other significant change. Carotid artery stenosis, unilateral, left (I65.22)  Carotid artery duplex 07/02/2018: Stenosis in the left internal carotid  artery (16-49%). Antegrade right vertebral artery flow. Antegrade left vertebral artery flow. Follow up in  one year is appropriate if clinically indicated. Compared to the study done on 12/06/2017, left ICA stenosis previously was greater than 50%.  Allergies (April Harrington; 2018/07/16 11:34 AM) SulfADIAZINE *Sulfonamides**  Doesnt know reaction Neosporin Original *DERMATOLOGICALS*  Doesnt know reaction  Family History (April Harrington; 07/16/18 11:34 AM) Father  Deceased. at age 20 from hardening of the arteries. Mother  Deceased. at age 59 from a stroke. Had adult onset of diabetes and heart disease. Siblings  6 siblings. 1 sister died at age 56 from kidney disease. 1 living sister--overweight. 4 brothers--1 has heart disease, another has COPD.  Social History (April Harrington; Jul 16, 2018 11:34 AM) Non Drinker/No Alcohol Use  Non Smoker/No Tobacco Use  Marital status  Married. Number of Adult (age 44 or over) Dependents  4. Living Situation  Lives with spouse. Current tobacco use  Never smoker. Number of Children  4.  Past Surgical History (April Harrington; July 16, 2018 11:34 AM) Thyroidectomy in 1970.  Hysterectomy in 1980.  Ductal carcinoma in situ in 2000 (cancer in left breast)-lumpectomy.  Knee Replacement, Total [2017]: Left, Right. Vertebral Column Fracture Repair [2018]:  Medication History [name], MD; 07/16/18 8:47 PM) Carvedilol (12.5MG Tablet, 1 Tablet Oral two times daily, Taken starting 03/05/2018) Active. Omeprazole (20MG Capsule ER, 1 Capsule DR Capsule DR Capsul Oral daily 30 minutes before meal, Taken starting 03/26/2013) Active. Losartan Potassium (25MG Tablet, 1 (one) Tablet Tablet Tablet Oral daily, Taken starting 01/31/2013) Active. Vitamin D (2000UNIT Tablet, 1 Oral daily, Taken starting 2014) Active. Hydrochlorothiazide (25MG Tablet, 1 Oral daily) Active. Synthroid (100MCG Tablet, 1 Oral daily) Active. Pravastatin  Sodium (40MG Tablet, 1 Oral at bedtime) Active. (Increase to 40 mg due to carotid stenosis progression 07/06/2017:) Aspirin (81MG Tablet, 1 Oral daily) Active. Hydrocodone-Acetaminophen (7.5-325MG Tablet, 1 Oral as needed) Active. PreserVision AREDS 2 (Oral) Active. Medications Reconciled (MEDS PRSENT)  Diagnostic Studies History [name], MD; 16-Jul-2018 8:46 PM) Coronary Angiogram  Coronary angiography on 03/24/2011 revealing a intermediate 70% stenosis, patient presenting with chest pain to the ED and repeat cardiac catheterization on 04/03/2011 revealing mild intramyocardial bridging of the ramus intermediate ostium without atherosclerotic obstruction. Carotid Doppler [05/31/2017]: Mild to moderate heterogenous plaque right carotid artery. Stenosis in the left internal carotid artery (50-69%), lower limit of stenosis. Antegrade right vertebral artery flow. Antegrade left vertebral artery flow. Follow up in six months is appropriate if clinically indicated. Compared to 05/30/2016, left ICA stenosis was <50%. Stress EKG 02/27/11: Positive stress EKG for ischemia. 7.3 METS. hypertensive BP. Low risk. ST depression persisted for > 4 minutes into recovery., No chest pain.  Doppler Ultrasound  Carotid artery duplex 07/02/2018: Stenosis in the left internal carotid artery (16-49%). Antegrade right vertebral artery flow. Antegrade left vertebral artery flow. Follow up in one year is appropriate if clinically indicated. Compared to the study done on 12/06/2017, left ICA stenosis previously was greater than 50%. Echocardiogram  07/02/2018 1. Left ventricle cavity is normal in size. Moderate concentric hypertrophy of the left ventricle. Basal septum is more hypertrophied. Normal global wall motion. Doppler evidence of grade II (pseudonormal) diastolic dysfunction. Calculated EF 65%. 2. Left atrial cavity is mildly dilated. 3. Trace aortic regurgitation. Moderate calcification of the aortic  valve annulus. Moderate aortic valve leaflet calcification. Moderate to severely restricted aortic valve leaflets. Severe aortic valve stenosis with peak gradient 69, mean gradient 46 mm od Hg. Calculated valve area is 0.65 cm2. 4. Mild (Grade I) mitral regurgitation. Mild calcification of the mitral valve annulus. 5. Mild tricuspid regurgitation.  Mild pulmonary hypertension with approx. PA syst. pressure of 37 mm of Hg. 6. IVC is dilated with respiratory variation. 7. c.f. echo. of 05/01/2017, Aortic stenosis has become severe, it was moderate before. Mild Pul. HTN is new, no other significant change.    Review of Systems [name] MD; 07/08/2018 12:16 PM) General Not Present- Anorexia, Fatigue and Fever. Respiratory Not Present- Cough, Hemoptysis and Wheezing. Cardiovascular Present- Difficulty Breathing On Exertion (worse in 6 months). Not Present- Chest Pain, Claudications, Edema, Orthopnea and Paroxysmal Nocturnal Dyspnea. Gastrointestinal Not Present- Change in Bowel Habits, Constipation and Nausea. Musculoskeletal Present- Backache and Joint Pain (both knee). Neurological Not Present- Focal Neurological Symptoms. Endocrine Not Present- Appetite Changes, Cold Intolerance and Heat Intolerance. Hematology Not Present- Anemia, Petechiae and Prolonged Bleeding. All other systems negative  Vitals (April Harrington; 07/08/2018 11:54 AM) 07/08/2018 11:40 AM Weight: 180.25 lb Height: 63in Body Surface Area: 1.85 m Body Mass Index: 31.93 kg/m  Pulse: 66 (Regular)  P.OX: 96% (Room air) BP: 151/67 (Sitting, Left Arm, Standard)       Physical Exam [name] MD; 07/08/2018 8:44 PM) General Mental Status-Alert. General Appearance-Cooperative, Appears stated age, Not in acute distress. Orientation-Oriented X3. Build & Nutrition-Moderately built and Mildly obese.  Head and Neck Thyroid Gland Characteristics - no palpable nodules, no palpable  enlargement.  Chest and Lung Exam Chest and lung exam reveals -quiet, even and easy respiratory effort with no use of accessory muscles and on auscultation, normal breath sounds, no adventitious sounds.  Cardiovascular Cardiovascular examination reveals -abdominal aorta auscultation reveals no bruits and no prominent pulsation, femoral artery auscultation bilaterally reveals normal pulses, no bruits, no thrills and normal pedal pulses bilaterally. Inspection Jugular vein - Right - No Distention. Auscultation Heart Sounds - S1 WNL, S2 WNL and No gallop present. Murmurs & Other Heart Sounds: Murmur - Location - Aortic Area. Timing - Early systolic. Grade - III/VI.  Abdomen Palpation/Percussion Normal exam - Non Tender and No hepatosplenomegaly. Auscultation Normal exam - Bowel sounds normal.  Peripheral Vascular Carotid arteries - Bilateral-Soft Bruit.  Neurologic Motor-Grossly intact without any focal deficits.  Musculoskeletal - Did not examine.    Assessment & Plan [name] MD; 07/08/2018 8:47 PM) Severe aortic stenosis (I35.0) Story: Echo- 07/02/2018 1. Left ventricle cavity is normal in size. Moderate concentric hypertrophy of the left ventricle. Basal septum is more hypertrophied. Normal global wall motion. Doppler evidence of grade II (pseudonormal) diastolic dysfunction. Calculated EF 65%. 2. Left atrial cavity is mildly dilated. 3. Trace aortic regurgitation. Moderate calcification of the aortic valve annulus. Moderate aortic valve leaflet calcification. Moderate to severely restricted aortic valve leaflets. Severe aortic valve stenosis with peak gradient 69, mean gradient 46 mm od Hg. Calculated valve area is 0.65 cm2. 4. Mild (Grade I) mitral regurgitation. Mild calcification of the mitral valve annulus. 5. Mild tricuspid regurgitation. Mild pulmonary hypertension with approx. PA syst. pressure of 37 mm of Hg. 6. IVC is dilated with respiratory  variation. 7. c.f. echo. of 05/01/2017, Aortic stenosis has become severe, it was moderate before. Mild Pul. HTN is new, no other significant change. Carotid artery stenosis, unilateral, left (I65.22) Story: Carotid artery duplex 07/02/2018: Stenosis in the left internal carotid artery (16-49%). Antegrade right vertebral artery flow. Antegrade left vertebral artery flow. Follow up in one year is appropriate if clinically indicated. Compared to the study done on 12/06/2017, left ICA stenosis previously was greater than 50%. Essential hypertension, benign (I10) Story: EKG 07/08/2018: Normal sinus rhythm at rate  of 64 bpm, left atrial enlargement, borderline criteria for LVH by voltage no evidence of ischemia. Normal QT interval. No significant change from EKG 07/06/2017 Current Plans Complete electrocardiogram (93000) Note:-  Recommendations:  [name] is a fairly active Asian female with history of possibly mild coronary spasm by coronary angiogram in 2014 when she presented with angina pectoris, otherwise no significant CAD, asymptomatic left carotid artery stenosis, aortic stenosis, essential hypertension, hyperlipidemia, GERD presents here for follow-up of carotid stenosis and aortic stenosis. She underwent right knee arthroplasty in Aug 2018 without any periprocedural complications.   She has noticed gradual worsening dyspnea over the past 6 months. I have reviewed the results of the carotid artery duplex and also echocardiogram the patient, she now has severe but noncritical aortic stenosis. Due to symptomatic aortic stenosis she will need aortic valve replacement. I'll refer to be evaluated by Dr. Gilford Raid. She may be a good candidate for TAVR as well. Otherwise blood pressure is well controlled, no clinical evidence of heart failure, I'll obtain previously performed labs from PCP and I'll see her back in 3 months. I have discussed with her that she may not need  angiography or a TEE unless requested by Dr. Cyndia Bent.  CC Dr. Jani Gravel.  Addendum: Patient evaluated by Dr. Angelena Form, would be a good candidate for TAVR, will schedule for left and right heart catheterization.  Patient is aware of risks and benefits and is willing to proceed.  Signed by [name], MD (07/08/2018 8:47 PM)

## 2018-08-06 ENCOUNTER — Ambulatory Visit (HOSPITAL_COMMUNITY)
Admission: RE | Admit: 2018-08-06 | Discharge: 2018-08-06 | Disposition: A | Discharge status: Home / Self Care | Payer: Medicare Other | Source: Ambulatory Visit | Attending: Cardiology | Admitting: Cardiology

## 2018-08-06 ENCOUNTER — Encounter (HOSPITAL_COMMUNITY)
Admission: RE | Disposition: A | Discharge status: Home / Self Care | Payer: Self-pay | Source: Ambulatory Visit | Attending: Cardiology

## 2018-08-06 ENCOUNTER — Other Ambulatory Visit: Payer: Self-pay

## 2018-08-06 DIAGNOSIS — Z7982 Long term (current) use of aspirin: Secondary | ICD-10-CM | POA: Diagnosis not present

## 2018-08-06 DIAGNOSIS — I10 Essential (primary) hypertension: Secondary | ICD-10-CM | POA: Diagnosis not present

## 2018-08-06 DIAGNOSIS — I350 Nonrheumatic aortic (valve) stenosis: Secondary | ICD-10-CM | POA: Diagnosis not present

## 2018-08-06 DIAGNOSIS — E7800 Pure hypercholesterolemia, unspecified: Secondary | ICD-10-CM | POA: Insufficient documentation

## 2018-08-06 DIAGNOSIS — I6522 Occlusion and stenosis of left carotid artery: Secondary | ICD-10-CM | POA: Diagnosis not present

## 2018-08-06 DIAGNOSIS — R0609 Other forms of dyspnea: Secondary | ICD-10-CM | POA: Insufficient documentation

## 2018-08-06 DIAGNOSIS — K219 Gastro-esophageal reflux disease without esophagitis: Secondary | ICD-10-CM | POA: Insufficient documentation

## 2018-08-06 DIAGNOSIS — I272 Pulmonary hypertension, unspecified: Secondary | ICD-10-CM | POA: Diagnosis not present

## 2018-08-06 HISTORY — PX: RIGHT/LEFT HEART CATH AND CORONARY ANGIOGRAPHY: CATH118266

## 2018-08-06 LAB — POCT I-STAT 3, VENOUS BLOOD GAS (G3P V)
ACID-BASE EXCESS: 3 mmol/L — AB (ref 0.0–2.0)
Bicarbonate: 28.5 mmol/L — ABNORMAL HIGH (ref 20.0–28.0)
O2 SAT: 74 %
PCO2 VEN: 49.2 mmHg (ref 44.0–60.0)
TCO2: 30 mmol/L (ref 22–32)
pH, Ven: 7.372 (ref 7.250–7.430)
pO2, Ven: 41 mmHg (ref 32.0–45.0)

## 2018-08-06 LAB — POCT I-STAT 3, ART BLOOD GAS (G3+)
ACID-BASE EXCESS: 1 mmol/L (ref 0.0–2.0)
BICARBONATE: 27.1 mmol/L (ref 20.0–28.0)
O2 Saturation: 95 %
PCO2 ART: 47.8 mmHg (ref 32.0–48.0)
PO2 ART: 81 mmHg — AB (ref 83.0–108.0)
TCO2: 29 mmol/L (ref 22–32)
pH, Arterial: 7.361 (ref 7.350–7.450)

## 2018-08-06 SURGERY — RIGHT/LEFT HEART CATH AND CORONARY ANGIOGRAPHY
Anesthesia: LOCAL

## 2018-08-06 MED ORDER — SODIUM CHLORIDE 0.9% FLUSH: 3.0000 mL | INTRAVENOUS | Status: DC | PRN

## 2018-08-06 MED ORDER — ASPIRIN 81 MG PO CHEW: 81.0000 mg | CHEWABLE_TABLET | ORAL | Status: DC

## 2018-08-06 MED ORDER — VERAPAMIL HCL 2.5 MG/ML IV SOLN
INTRAVENOUS | Status: DC | PRN
  Administered 2018-08-06: 10 mL via INTRA_ARTERIAL

## 2018-08-06 MED ORDER — HEPARIN (PORCINE) IN NACL 1000-0.9 UT/500ML-% IV SOLN
INTRAVENOUS | Status: DC | PRN
  Administered 2018-08-06 (×3): 500 mL

## 2018-08-06 MED ORDER — MIDAZOLAM HCL 2 MG/2ML IJ SOLN
INTRAMUSCULAR | Status: AC
  Filled 2018-08-06: qty 2

## 2018-08-06 MED ORDER — IOHEXOL 350 MG/ML SOLN
INTRAVENOUS | Status: DC | PRN
  Administered 2018-08-06: 30 mL via INTRA_ARTERIAL

## 2018-08-06 MED ORDER — LIDOCAINE HCL (PF) 1 % IJ SOLN
INTRAMUSCULAR | Status: AC
  Filled 2018-08-06: qty 30

## 2018-08-06 MED ORDER — HEPARIN (PORCINE) IN NACL 1000-0.9 UT/500ML-% IV SOLN
INTRAVENOUS | Status: AC
  Filled 2018-08-06: qty 1000

## 2018-08-06 MED ORDER — ACETAMINOPHEN 325 MG PO TABS: 650.0000 mg | ORAL_TABLET | ORAL | Status: DC | PRN

## 2018-08-06 MED ORDER — SODIUM CHLORIDE 0.9 % IV SOLN: 250.0000 mL | INTRAVENOUS | Status: DC | PRN

## 2018-08-06 MED ORDER — MIDAZOLAM HCL 2 MG/2ML IJ SOLN
INTRAMUSCULAR | Status: DC | PRN
  Administered 2018-08-06: 2 mg via INTRAVENOUS

## 2018-08-06 MED ORDER — VERAPAMIL HCL 2.5 MG/ML IV SOLN
INTRAVENOUS | Status: AC
  Filled 2018-08-06: qty 2

## 2018-08-06 MED ORDER — FENTANYL CITRATE (PF) 100 MCG/2ML IJ SOLN
INTRAMUSCULAR | Status: AC
  Filled 2018-08-06: qty 2

## 2018-08-06 MED ORDER — SODIUM CHLORIDE 0.9% FLUSH: 3.0000 mL | Freq: Two times a day (BID) | INTRAVENOUS | Status: DC

## 2018-08-06 MED ORDER — SODIUM CHLORIDE 0.9 % WEIGHT BASED INFUSION: 1.0000 mL/kg/h | INTRAVENOUS | Status: DC

## 2018-08-06 MED ORDER — FENTANYL CITRATE (PF) 100 MCG/2ML IJ SOLN
INTRAMUSCULAR | Status: DC | PRN
  Administered 2018-08-06: 50 ug via INTRAVENOUS

## 2018-08-06 MED ORDER — ONDANSETRON HCL 4 MG/2ML IJ SOLN: 4.0000 mg | Freq: Four times a day (QID) | INTRAMUSCULAR | Status: DC | PRN

## 2018-08-06 MED ORDER — SODIUM CHLORIDE 0.9 % WEIGHT BASED INFUSION
3.0000 mL/kg/h | INTRAVENOUS | Status: AC
  Administered 2018-08-06: 3 mL/kg/h via INTRAVENOUS

## 2018-08-06 MED ORDER — LIDOCAINE HCL (PF) 1 % IJ SOLN
INTRAMUSCULAR | Status: DC | PRN
  Administered 2018-08-06: 2 mL via INTRADERMAL

## 2018-08-06 MED ORDER — HEPARIN SODIUM (PORCINE) 1000 UNIT/ML IJ SOLN
INTRAMUSCULAR | Status: DC | PRN
  Administered 2018-08-06: 4000 [IU] via INTRAVENOUS

## 2018-08-06 SURGICAL SUPPLY — 13 items
CATH BALLN WEDGE 5F 110CM (CATHETERS) ×2 IMPLANT
CATH OPTITORQUE TIG 4.0 5F (CATHETERS) ×2 IMPLANT
DEVICE RAD COMP TR BAND LRG (VASCULAR PRODUCTS) ×2 IMPLANT
GLIDESHEATH SLEND A-KIT 6F 20G (SHEATH) ×2 IMPLANT
GUIDEWIRE .025 260CM (WIRE) ×2 IMPLANT
GUIDEWIRE INQWIRE 1.5J.035X260 (WIRE) ×1 IMPLANT
INQWIRE 1.5J .035X260CM (WIRE) ×2
KIT HEART LEFT (KITS) ×2 IMPLANT
PACK CARDIAC CATHETERIZATION (CUSTOM PROCEDURE TRAY) ×2 IMPLANT
SHEATH GLIDE SLENDER 4/5FR (SHEATH) ×2 IMPLANT
TRANSDUCER W/STOPCOCK (MISCELLANEOUS) ×2 IMPLANT
TUBING CIL FLEX 10 FLL-RA (TUBING) ×2 IMPLANT
WIRE MICROINTRODUCER 60CM (WIRE) ×2 IMPLANT

## 2018-08-06 NOTE — Interval H&P Note (Signed)
History and Physical Interval Note:  08/06/2018 2:05 PM  Zoe [name]  has presented today for surgery, with the diagnosis of as  The various methods of treatment have been discussed with the patient and family. After consideration of risks, benefits and other options for treatment, the patient has consented to  Procedure(s): RIGHT/LEFT HEART CATH AND CORONARY/GRAFT ANGIOGRAPHY (N/A) as a surgical intervention .  The patient's history has been reviewed, patient examined, no change in status, stable for surgery.  I have reviewed the patient's chart and labs.  Questions were answered to the patient's satisfaction.     Adrian Prows

## 2018-08-06 NOTE — Discharge Instructions (Signed)

## 2018-08-07 ENCOUNTER — Encounter (HOSPITAL_COMMUNITY): Payer: Self-pay | Admitting: Cardiology

## 2018-08-13 DIAGNOSIS — I6522 Occlusion and stenosis of left carotid artery: Secondary | ICD-10-CM | POA: Diagnosis not present

## 2018-08-13 DIAGNOSIS — I350 Nonrheumatic aortic (valve) stenosis: Secondary | ICD-10-CM | POA: Diagnosis not present

## 2018-08-13 DIAGNOSIS — R0609 Other forms of dyspnea: Secondary | ICD-10-CM | POA: Diagnosis not present

## 2018-08-13 DIAGNOSIS — I10 Essential (primary) hypertension: Secondary | ICD-10-CM | POA: Diagnosis not present

## 2018-08-15 ENCOUNTER — Ambulatory Visit (HOSPITAL_COMMUNITY)
Admission: RE | Admit: 2018-08-15 | Discharge: 2018-08-15 | Disposition: A | Discharge status: Home / Self Care | Payer: Medicare Other | Source: Ambulatory Visit | Attending: Cardiovascular Disease | Admitting: Cardiovascular Disease

## 2018-08-15 DIAGNOSIS — I350 Nonrheumatic aortic (valve) stenosis: Secondary | ICD-10-CM

## 2018-08-15 DIAGNOSIS — I517 Cardiomegaly: Secondary | ICD-10-CM | POA: Insufficient documentation

## 2018-08-15 DIAGNOSIS — K5730 Diverticulosis of large intestine without perforation or abscess without bleeding: Secondary | ICD-10-CM | POA: Diagnosis not present

## 2018-08-15 DIAGNOSIS — R0609 Other forms of dyspnea: Secondary | ICD-10-CM | POA: Insufficient documentation

## 2018-08-15 DIAGNOSIS — J90 Pleural effusion, not elsewhere classified: Secondary | ICD-10-CM | POA: Insufficient documentation

## 2018-08-15 DIAGNOSIS — K769 Liver disease, unspecified: Secondary | ICD-10-CM | POA: Diagnosis not present

## 2018-08-15 LAB — PULMONARY FUNCTION TEST
DL/VA % pred: 101 %
DL/VA: 4.83 ml/min/mmHg/L
DLCO UNC % PRED: 54 %
DLCO UNC: 12.86 ml/min/mmHg
FEF 25-75 PRE: 0.62 L/s
FEF 25-75 Post: 0.66 L/sec
FEF2575-%Change-Post: 7 %
FEF2575-%PRED-POST: 51 %
FEF2575-%PRED-PRE: 48 %
FEV1-%CHANGE-POST: 2 %
FEV1-%PRED-POST: 58 %
FEV1-%Pred-Pre: 56 %
FEV1-POST: 1.06 L
FEV1-Pre: 1.03 L
FEV1FVC-%Change-Post: 8 %
FEV1FVC-%PRED-PRE: 95 %
FEV6-%CHANGE-POST: -5 %
FEV6-%PRED-POST: 59 %
FEV6-%Pred-Pre: 63 %
FEV6-POST: 1.39 L
FEV6-Pre: 1.47 L
FEV6FVC-%PRED-POST: 106 %
FEV6FVC-%Pred-Pre: 106 %
FVC-%Change-Post: -5 %
FVC-%Pred-Post: 56 %
FVC-%Pred-Pre: 59 %
FVC-Post: 1.39 L
FVC-Pre: 1.47 L
PRE FEV6/FVC RATIO: 100 %
Post FEV1/FVC ratio: 76 %
Post FEV6/FVC ratio: 100 %
Pre FEV1/FVC ratio: 70 %
RV % PRED: 82 %
RV: 1.99 L
TLC % pred: 70 %
TLC: 3.5 L

## 2018-08-15 LAB — POCT I-STAT CREATININE: Creatinine, Ser: 0.6 mg/dL (ref 0.44–1.00)

## 2018-08-15 MED ORDER — ALBUTEROL SULFATE (2.5 MG/3ML) 0.083% IN NEBU
2.5000 mg | INHALATION_SOLUTION | Freq: Once | RESPIRATORY_TRACT | Status: AC
  Administered 2018-08-15: 2.5 mg via RESPIRATORY_TRACT

## 2018-08-15 MED ORDER — IOPAMIDOL (ISOVUE-370) INJECTION 76%
100.0000 mL | Freq: Once | INTRAVENOUS | Status: AC | PRN
  Administered 2018-08-15: 100 mL via INTRAVENOUS

## 2018-08-19 ENCOUNTER — Encounter: Payer: Medicare Other | Admitting: Thoracic Surgery (Cardiothoracic Vascular Surgery)

## 2018-08-19 NOTE — Progress Notes (Signed)
This encounter was created in error - please disregard.  This encounter was created in error - please disregard.

## 2018-08-22 ENCOUNTER — Encounter: Payer: Self-pay | Admitting: Physical Therapy

## 2018-08-22 ENCOUNTER — Institutional Professional Consult (permissible substitution): Payer: Medicare Other | Admitting: Thoracic Surgery (Cardiothoracic Vascular Surgery)

## 2018-08-22 ENCOUNTER — Other Ambulatory Visit: Payer: Self-pay

## 2018-08-22 ENCOUNTER — Encounter: Payer: Self-pay | Admitting: Thoracic Surgery (Cardiothoracic Vascular Surgery)

## 2018-08-22 ENCOUNTER — Ambulatory Visit: Payer: Medicare Other | Attending: Cardiovascular Disease | Admitting: Physical Therapy

## 2018-08-22 ENCOUNTER — Encounter: Payer: Medicare Other | Admitting: Thoracic Surgery (Cardiothoracic Vascular Surgery)

## 2018-08-22 VITALS — BP 145/73 | HR 70 | Resp 16 | Ht 64.0 in | Wt 177.0 lb

## 2018-08-22 DIAGNOSIS — I350 Nonrheumatic aortic (valve) stenosis: Secondary | ICD-10-CM

## 2018-08-22 DIAGNOSIS — R2689 Other abnormalities of gait and mobility: Secondary | ICD-10-CM | POA: Diagnosis not present

## 2018-08-22 NOTE — Patient Instructions (Addendum)
  Continue taking all current medications without change through the day before surgery.  Have nothing to eat or drink after midnight the night before surgery.  On the morning of surgery take only Synthroid and Prilosec with a sip of water.

## 2018-08-22 NOTE — H&P (View-Only) (Signed)
[facility] CONSULTATION REPORT  Referring Provider is Adrian Prows, MD PCP is Jani Gravel, MD  Chief Complaint  Patient presents with  . Aortic Stenosis    TAVR CONSULT...ECHO 07/02/18 @ DR. GANGI'S    HPI:  Patient is an 84 year old female with history of aortic stenosis, hypertension, and hypercholesterolemia who has been referred for surgical consultation to discuss treatment options for management of severe symptomatic aortic stenosis.    Patient states that she was first noted to have a heart murmur on physical exam by her primary care physician several years ago.  Echocardiogram revealed the presence and she was referred to Dr. Einar Gip who has been following her carefully ever since.  Recent follow-up echocardiogram reportedly demonstrated the presence of severe aortic stenosis with normal left ventricular systolic function.  Patient has developed exertional shortness of breath and fatigue.  She subsequently underwent diagnostic cardiac catheterization which revealed normal coronary arteries and no significant coronary artery disease.  She had moderate pulmonary hypertension.  Transvalvular gradient across the aortic valve was not assessed.  Patient was referred to the multidisciplinary heart valve clinic and is been evaluated previously by Dr. Angelena Form.  Transcatheter aortic valve replacement was discussed as an alternative to conventional surgery.  CT angiography was performed and the patient referred for surgical consultation.  The patient is married and lives locally in [place] with her husband.  She has 4 adult children and 10 grandchildren.  She enjoys gardening and remains entirely functionally independent.  Approximately 2 years ago she had knee replacement surgery on one side and then the following year again on the other.  Since that time she has not been quite as active physically, although she has  recovered uneventfully and reports no limitations related to arthritis.  She does note that she now gets short of breath with moderate level activity.  She denies any resting shortness of breath, PND, orthopnea, or lower extremity edema.  She has not had any exertional chest pain.  Recently she has been having some mild tightness across her chest which she blames on anxiety.  She denies any palpitations, dizzy spells, or syncope.  Past Medical History:  Diagnosis Date  . Arthritis    some - per patient  . [facility])    breast cancer / left   . Cataract    bilat   . GERD (gastroesophageal reflux disease)   . Heart murmur   . History of bronchitis   . History of kidney stones   . Hyperlipidemia   . Hypertension   . Hypothyroidism   . Macular degeneration    Left  . Nasal congestion   . Shortness of breath dyspnea    increased exertion   . Stress incontinence   . Thyroid disease   . Tinnitus   . Wears glasses     Past Surgical History:  Procedure Laterality Date  . ABDOMINAL HYSTERECTOMY  1970's  . BREAST LUMPECTOMY  12/1998   lumpectomy  . CARDIAC CATHETERIZATION    . EYE SURGERY     cataract surgery bilat   . LITHOTRIPSY    . Right total knee     2018 Dr. Alvan Dame  . RIGHT/LEFT HEART CATH AND CORONARY ANGIOGRAPHY N/A 08/06/2018   Procedure: RIGHT/LEFT HEART CATH AND CORONARY ANGIOGRAPHY;  Surgeon: Adrian Prows, MD;  Location: [facility];  Service: Cardiovascular;  Laterality: N/A;  . THYROIDECTOMY, PARTIAL  1975  . TONSILLECTOMY  as a child - patient not sure of exact date  . TOTAL KNEE ARTHROPLASTY Left 03/13/2016   Procedure: TOTAL KNEE ARTHROPLASTY;  Surgeon: Paralee Cancel, MD;  Location: [facility];  Service: Orthopedics;  Laterality: Left;  . TOTAL KNEE ARTHROPLASTY Right 06/18/2017   Procedure: RIGHT TOTAL KNEE ARTHROPLASTY;  Surgeon: Paralee Cancel, MD;  Location: [facility];  Service: Orthopedics;  Laterality: Right;    Family History  Problem Relation Age of Onset   . Diabetes Mother   . Stroke Mother        Carotid artery disease  . Heart disease Father        CAD    Social History   Socioeconomic History  . Marital status: Married    Spouse name: Not on file  . Number of children: 4  . Years of education: Not on file  . Highest education level: Not on file  Occupational History  . 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 Financial resource strain: Not on file  . Food insecurity:    Worry: Not on file    Inability: Not on file  . Transportation needs:    Medical: Not on file    Non-medical: Not on file  Tobacco Use  . Smoking status: Never Smoker  . Smokeless tobacco: Never Used  Substance and Sexual Activity  . Alcohol use: No  . Drug use: No  . Sexual activity: Not Currently  Lifestyle  . Physical activity:    Days per week: Not on file    Minutes per session: Not on file  . Stress: Not on file  Relationships  . Social connections:    Talks on phone: Not on file    Gets together: Not on file    Attends religious service: Not on file    Active member of club or organization: Not on file    Attends meetings of clubs or organizations: Not on file    Relationship status: Not on file  . Intimate partner violence:    Fear of current or ex partner: Not on file    Emotionally abused: Not on file    Physically abused: Not on file    Forced sexual activity: Not on file  Other Topics Concern  . Not on file  Social History Narrative  . Not on file    Current Outpatient Medications  Medication Sig Dispense Refill  . Artificial Tear Solution (SOOTHE XP) SOLN Place 1 drop into both eyes every evening.    . carvedilol (COREG) 12.5 MG tablet Take 12.5 mg by mouth 2 (two) times daily with a meal.    . Cholecalciferol (VITAMIN D3) 2000 units TABS Take 2,000 Units by mouth daily.    . hydrochlorothiazide (HYDRODIURIL) 25 MG tablet Take 25 mg by mouth daily.  4  . levothyroxine (SYNTHROID,  LEVOTHROID) 100 MCG tablet Take 100 mcg by mouth daily before breakfast.  2  . losartan (COZAAR) 25 MG tablet Take 50 mg by mouth daily.     . Multiple Vitamins-Minerals (PRESERVISION AREDS 2) CAPS Take 1 tablet by mouth 2 (two) times daily.    Marland Kitchen omeprazole (PRILOSEC) 20 MG capsule Take 20 mg by mouth daily.    . pravastatin (PRAVACHOL) 40 MG tablet Take 40 mg by mouth every evening.     No current facility-administered medications for this visit.     Allergies  Allergen Reactions  . Penicillins Other (See Comments)    Patient does not  remember reaction. Happened many years ago. Has patient had a PCN reaction causing immediate rash, facial/tongue/throat swelling, SOB or lightheadedness with hypotension: no Has patient had a PCN reaction causing severe rash involving mucus membranes or skin necrosis: no Has patient had a PCN reaction that required hospitalization no Has patient had a PCN reaction occurring within the last 10 years: no If all of the above answers are "NO", then may proceed with Cephalosporin use.   . Sulfa Antibiotics     Unknown, maybe vision issues      Review of Systems:   General:  normal appetite, decreased energy, no weight gain, no weight loss, no fever  Cardiac:  no chest pain with exertion, occasional chest pain at rest, + SOB with exertion, no resting SOB, no PND, no orthopnea, no palpitations, no arrhythmia, no atrial fibrillation, no LE edema, no dizzy spells, no syncope  Respiratory:  + exertional shortness of breath, no home oxygen, occasional productive cough, no dry cough, no bronchitis, + wheezing, no hemoptysis, no asthma, no pain with inspiration or cough, no sleep apnea, no CPAP at night  GI:   no difficulty swallowing, no reflux, no frequent heartburn, no hiatal hernia, no abdominal pain, no constipation, no diarrhea, no hematochezia, no hematemesis, no melena  GU:   no dysuria,  + frequency, no urinary tract infection, no hematuria, no kidney  stones, no kidney disease  Vascular:  no pain suggestive of claudication, no pain in feet, + occasional leg cramps, no varicose veins, no DVT, no non-healing foot ulcer  Neuro:   no stroke, no TIA's, no seizures, no headaches, no temporary blindness one eye,  no slurred speech, no peripheral neuropathy, no chronic pain, no instability of gait, no memory/cognitive dysfunction  Musculoskeletal: + arthritis, no joint swelling, no myalgias, no difficulty walking, normal mobility   Skin:   no rash, no itching, no skin infections, no pressure sores or ulcerations  Psych:   no anxiety, no depression, no nervousness, no unusual recent stress  Eyes:   + blurry vision, no floaters, no recent vision changes, + wears glasses or contacts  ENT:   no hearing loss, no loose or painful teeth, no dentures, last saw dentist within the past year  Hematologic:  no easy bruising, no abnormal bleeding, no clotting disorder, no frequent epistaxis  Endocrine:  no diabetes, does not check CBG's at home           Physical Exam:   BP (!) 145/73 (BP Location: Right Arm, Patient Position: Sitting, Cuff Size: Large)   Pulse 70   Resp 16   Ht 5\' 4"  (1.626 m)   Wt 177 lb (80.3 kg)   SpO2 94% Comment: ON RA....JUST COMPLETED XERCISE STUDY  BMI 30.38 kg/m   General:  Moderately obese,  well-appearing  HEENT:  Unremarkable   Neck:   no JVD, no bruits, no adenopathy   Chest:   clear to auscultation, symmetrical breath sounds, no wheezes, no rhonchi   CV:   RRR, grade III/VI crescendo/decrescendo murmur heard best at RUSB, no diastolic murmur  Abdomen:  soft, non-tender, no masses   Extremities:  warm, well-perfused, pulses diminished but palpable, no LE edema  Rectal/GU  Deferred  Neuro:   Grossly non-focal and symmetrical throughout  Skin:   Clean and dry, no rashes, no breakdown   Diagnostic Tests:  TRANSTHORACIC ECHOCARDIOGRAM  Abbreviated report from transthoracic echocardiogram performed July 02, 2018  at Dr. Irven Shelling office is reviewed.  The official report and  images from this study are not currently available.  By report there was normal left ventricular size and systolic function with ejection fraction estimated 65%.  The aortic valve was reportedly thickened and calcified with restricted leaflet mobility.  Mean transvalvular gradient was estimated 46 mmHg corresponding to aortic valve area calculated 0.65 cm.  There was reportedly trace aortic insufficiency, mild mitral regurgitation, and mild tricuspid regurgitation.    EKG: NSR w/out acute ischemic changes, no conduction system disease    RIGHT/LEFT HEART CATH AND CORONARY ANGIOGRAPHY  Conclusion     Hemodynamic findings consistent with moderate pulmonary hypertension.    Procedure performed: Right heart catheterization, selective left and right coronary arteriogram.  Indication severe aortic stenosis. Dyspnea on exertion.  Normal coronary arteries, right dominant circulation.  Left ventriculogram not performed. Moderate pulmonary hypertension due to elevated LVEDP, WHO group 2. RA 7/7, mean 5 mmHg; RV 49/1, EDP 7 mmHg; PA 54/24, mean 36 mmHg, PA saturation 74%.  PW 21/26, mean 20 mmHg.  Aortic saturation 95%. CO 7.51, CI 4.05 by Fick. No indication for antiplatelet therapy at this time.  Indications   Nonrheumatic aortic valve stenosis [I35.0 (ICD-10-CM)]  Dyspnea on exertion [R06.09 (ICD-10-CM)]  Procedural Details/Technique   Technical Details Procedure performed: Procedure performed: Right heart catheterization, selective left and right coronary arteriogram. Indication:  She has history of breast cancer more than 20 years ago and has had lumpectomy, hypertension, hyperlipidemia, asymptomatic mild to moderate carotid artery disease. Recently had echocardiogram due to shortness of breath which revealed severe aortic stenosis with mean gradient of 46 and a peak gradient of 70 mmHg and valve area was palpated at 0.65 cm.  She is now being evaluated for TAVR. She is now brought to the cardiac catheterization lab for right and left heart catheterization prior to planned valvular surgery/procedure.  Technique: Right AC vein access for right heart and Right radial access for left heart catheterization was utilized for performing the procedure.   Technique:   A 5 French sheath introduced into right right AC vein for access under fluroscopic guidance. A 5 French Swan-Ganz catheter was advanced with balloon inflated under fluoroscopic guidance into first the right atrium followed by the right ventricle and into the pulmonary artery to pulmonary artery wedge position. Hemodynamics were obtained in a locations.  After hemodynamics were completed, samples were taken for SaO2% measurement to be used in Fick Calculation of cardiac output and cardiac index. The catheter was then pulled back the balloon down and then completely out of the body. Hemostasis obtained by manual pressure over the access site.  Under sterile precautions using a 6 French right radial arterial access, a 6 French sheath was introduced into the right radial artery. A 5 [place] Tig 4 catheter was advanced into the ascending aorta selective right coronary artery and left coronary artery was cannulated and angiography was performed in multiple views. The catheter was pulled back Out of the body over exchange length J-wire. Catheter exchanged out of the body over J-Wire. NO immediate complications noted. Patient tolerated the procedure well. Contrast used: 79mL.   Estimated blood loss <50 mL.  During this procedure the patient was administered the following to achieve and maintain moderate conscious sedation: Versed 2 mg, Fentanyl 50 mcg, while the patient's heart rate, blood pressure, and oxygen saturation were continuously monitored. The period of conscious sedation was 23 minutes, of which I was present face-to-face 100% of this time.  Coronary Findings    Diagnostic  Dominance: Right  Left  Main  Vessel was injected. Vessel is normal in caliber. Vessel is angiographically normal.  Left Anterior Descending  Vessel was injected. Vessel is normal in caliber. Vessel is angiographically normal.  Left Circumflex  Vessel was injected. Vessel is normal in caliber. Vessel is angiographically normal.  Right Coronary Artery  Vessel was injected. Vessel is normal in caliber. Vessel is angiographically normal.  Intervention   No interventions have been documented.  Right Heart   Right Heart Pressures Hemodynamic findings consistent with moderate pulmonary hypertension.  Coronary Diagrams   Diagnostic Diagram       Implants    No implant documentation for this case.  MERGE Images   Show images for CARDIAC CATHETERIZATION   Link to Procedure Log   Procedure Log    Hemo Data    Most Recent Value  Fick Cardiac Output 7.51 L/min  Fick Cardiac Output Index 4.05 (L/min)/BSA  RA A Wave 7 mmHg  RA V Wave 7 mmHg  RA Mean 5 mmHg  RV Systolic Pressure 49 mmHg  RV Diastolic Pressure 1 mmHg  RV EDP 7 mmHg  PA Systolic Pressure 54 mmHg  PA Diastolic Pressure 24 mmHg  PA Mean 36 mmHg  PW A Wave 21 mmHg  PW V Wave 26 mmHg  PW Mean 20 mmHg  AO Systolic Pressure 459 mmHg  AO Diastolic Pressure 71 mmHg  AO Mean 104 mmHg  QP/QS 1  TPVR Index 8.89 HRUI  TSVR Index 25.68 HRUI  PVR SVR Ratio 0.16  TPVR/TSVR Ratio 0.35    Cardiac TAVR CT  TECHNIQUE: The patient was scanned on a Siemens Force 977 slice scanner. A 120 kV retrospective scan was triggered in the descending thoracic aorta at 111 HU's. Gantry rotation speed was 270 msecs and collimation was .9 mm. No beta blockade or nitro were given. The 3D data set was reconstructed in 5% intervals of the R-R cycle. Systolic and diastolic phases were analyzed on a dedicated work station using MPR, MIP and VRT modes. The patient received 80 cc of contrast.  FINDINGS: Aortic Valve:  Tri leaflet calcified with restricted leaflet motion  Aorta: Mild calcific atherosclerosis with normal arch vessels  Sinotubular Junction: 23 mm  Ascending Thoracic Aorta: 28 mm  Aortic Arch: 24 mm  Descending Thoracic Aorta: 23 mm  Sinus of Valsalva Measurements:  Non-coronary: 27 mm  Right - coronary: 26.4 mm  Left - coronary: 27.2 mm  Coronary Artery Height above Annulus:  Left Main: 11 mm above annulus  Right Coronary: 14 mm above annulus  Virtual Basal Annulus Measurements:  Maximum/Minimum Diameter: 18 x 25 mm  Perimeter: 70.6 mm  Area: 373 mm2  Coronary Arteries: Sufficient height above annulus for deployment  Optimum Fluoroscopic Angle for Delivery: LAO 25 Caudal 2 degrees  IMPRESSION: 1. Calcified tri leaflet AV with annular area of 373 mm2 suitable for a 23 mm Sapien 3 valve  2.  Coronary arteries sufficient height above annulus for deployment  3.  Normal aortic  root 2.8 cm  4. Optimum angiographic angle for deployment LAO 25 Caudal 2 degrees  5.  No LAA thrombus  Jenkins Rouge   Electronically Signed   By: Jenkins Rouge M.D.   On: 08/15/2018 14:53   CT ANGIOGRAPHY CHEST, ABDOMEN AND PELVIS  TECHNIQUE: Multidetector CT imaging through the chest, abdomen and pelvis was performed using the standard protocol during bolus administration of intravenous contrast. Multiplanar reconstructed images and MIPs were obtained and reviewed to evaluate the vascular anatomy.  CONTRAST:  193mL ISOVUE-370 IOPAMIDOL (ISOVUE-370) INJECTION 76%  COMPARISON:  Chest CT 07/31/2006. No prior CT the abdomen and pelvis.  FINDINGS: CTA CHEST FINDINGS  Cardiovascular: Heart size is enlarged with concentric left ventricular hypertrophy and biatrial dilatation. There is no significant pericardial fluid, thickening or pericardial calcification. Aortic atherosclerosis. No definite coronary artery calcifications. Severe thickening  calcification of the aortic valve. Severe calcifications of the mitral annulus.  Mediastinum/Lymph Nodes: Multiple prominent borderline enlarged and mildly enlarged mediastinal and hilar lymph nodes, largest of which measures up to 18 mm in the subcarinal nodal station and 17 mm in the low right paratracheal nodal station. Esophagus is unremarkable in appearance. No axillary lymphadenopathy.  Lungs/Pleura: Widespread ground-glass attenuation and interlobular septal thickening, favored to reflect a background of mild interstitial pulmonary edema. Scattered linear areas, most compatible with areas of post infectious or inflammatory scarring or atelectasis. Small right and trace left pleural effusions lying dependently. No confluent consolidative airspace disease. No definite suspicious appearing pulmonary nodules or masses are noted.  Musculoskeletal/Soft Tissues: Chronic T12 compression fracture with 10% loss of anterior vertebral body height and post vertebroplasty changes. There are no aggressive appearing lytic or blastic lesions noted in the visualized portions of the skeleton.  CTA ABDOMEN AND PELVIS FINDINGS  Hepatobiliary: Large lesion in the liver, centered predominantly in segment 8 measuring 5.4 x 7.8 cm (axial image 81 of series 7) which is centrally low-attenuation with avid peripheral nodular enhancement on the arterial phase images obtained, similar in size to a low-attenuation lesion on study from 2007, most compatible with a large cavernous hemangioma. In addition, there is a 13 x 10 mm hypervascular lesion also in segment 8 (axial image 71 of series 7), with surrounding perfusion anomaly, which is also similar in retrospect to prior study from 2007, presumably a smaller flash fill cavernous hemangioma. No other suspicious hepatic lesions. No intra or extrahepatic biliary ductal dilatation. Gallbladder is normal in appearance.  Pancreas: No pancreatic mass.  No pancreatic ductal dilatation. No pancreatic or peripancreatic fluid or inflammatory changes.  Spleen: Unremarkable.  Adrenals/Urinary Tract: Bilateral kidneys and adrenal glands are normal in appearance. No hydroureteronephrosis. Small amount of gas non dependently in the lumen of the urinary bladder. Urinary bladder is otherwise unremarkable in appearance.  Stomach/Bowel: Normal appearance of the stomach. No pathologic dilatation of small bowel or colon. A few scattered colonic diverticulae are noted, particularly in the sigmoid colon, without surrounding inflammatory changes to suggest an acute diverticulitis at this time. Normal appendix.  Vascular/Lymphatic: Aortic atherosclerosis, without evidence of aneurysm or dissection in the abdominal or pelvic vasculature. Vascular findings and measurements pertinent to potential TAVR procedure, as detailed below. No lymphadenopathy noted in the abdomen or pelvis.  Reproductive: Status post total abdominal hysterectomy. Ovaries are unremarkable in appearance.  Other: No significant volume of ascites.  No pneumoperitoneum.  Musculoskeletal: There are no aggressive appearing lytic or blastic lesions noted in the visualized portions of the skeleton.  VASCULAR MEASUREMENTS PERTINENT TO TAVR:  AORTA:  Minimal Aortic Diameter-12 x 11 mm  Severity of Aortic Calcification-severe  RIGHT PELVIS:  Right Common Iliac Artery -  Minimal Diameter-10.3 x 8.0 mm  Tortuosity-mild  Calcification-mild  Right External Iliac Artery -  Minimal Diameter-7.2 x 7.4 mm  Tortuosity - mild  Calcification-none  Right Common Femoral Artery -  Minimal Diameter-7.8 x 7.1 mm  Tortuosity - mild  Calcification-mild  LEFT PELVIS:  Left Common Iliac Artery -  Minimal Diameter-8.4 x 9.1 mm  Tortuosity - mild  Calcification-mild  Left External Iliac Artery -  Minimal Diameter-7.3 x 8.1 mm  Tortuosity  - mild  Calcification-none  Left Common Femoral Artery -  Minimal Diameter-7.5 x 7.3 mm  Tortuosity - mild  Calcification-mild  Review of the MIP images confirms the above findings.  IMPRESSION: 1. Vascular findings and measurements pertinent to potential TAVR procedure, as detailed above. 2. Severe thickening calcification of the aortic valve, compatible with the reported clinical history of severe aortic stenosis. 3. Cardiomegaly with concentric left ventricular hypertrophy and biatrial dilatation. There is also evidence of interstitial pulmonary edema in the lungs bilaterally and bilateral pleural effusions (small on the right and trace on the left), suggesting congestive heart failure. 4. Multiple borderline enlarged and mildly enlarged mediastinal and hilar lymph nodes, nonspecific in the setting of presumed congestive heart failure. No specific findings to strongly suggest malignancy on today's examination. 5. Mild colonic diverticulosis without evidence of acute diverticulitis at this time. 6. Two hypervascular liver lesions, as above, compatible with cavernous hemangiomas. 7. Additional incidental findings, as above.   Electronically Signed   By: Vinnie Langton M.D.   On: 08/15/2018 15:33    Impression:  Patient has stage D severe symptomatic aortic stenosis.  She describes gradual progression of symptoms of exertional shortness of breath and fatigue consistent with chronic diastolic congestive heart failure, New York Heart Association functional class II.  Report from transthoracic echocardiogram performed recently Dr. Irven Shelling office is consistent with the presence of severe aortic stenosis.  I have personally reviewed the patient's recent diagnostic cardiac catheterization and CT angiograms.  Catheterization reveals normal coronary artery anatomy with no significant coronary artery disease.  There was mild to moderate pulmonary hypertension.   Transvalvular gradient across the aortic valve was not assessed.  Cardiac-gated CTA of the heart reveals anatomical characteristics consistent with aortic stenosis suitable for treatment by transcatheter aortic valve replacement without any significant complicating features and CTA of the aorta and iliac vessels demonstrate what appears to be adequate pelvic vascular access to facilitate a transfemoral approach.  I agree the patient would likely benefit from aortic valve replacement.  Risks associated with conventional surgery would be mild to moderately elevated because of the patient's advanced age.  Transcatheter aortic valve replacement would be reasonable alternative to conventional surgery.    Plan:  The patient and her husband were counseled at length regarding treatment alternatives for management of severe symptomatic aortic stenosis. Alternative approaches such as conventional aortic valve replacement, transcatheter aortic valve replacement, and continued medical therapy without intervention were compared and contrasted at length.  The risks associated with conventional surgical aortic valve replacement were discussed in detail, as were expectations for post-operative convalescence.  Issues specific to transcatheter aortic valve replacement were discussed including questions about long term valve durability, the potential for paravalvular leak, possible increased risk of need for permanent pacemaker placement, and other technical complications related to the procedure itself.  Long-term prognosis with medical therapy was discussed. This discussion was placed in the context of the patient's own specific clinical presentation and past medical history.  All of their questions have been addressed.  The patient hopes to proceed with transcatheter aortic valve replacement as soon as practical.  We tentatively plan for surgery on September 03, 2018.  Following the decision to proceed with transcatheter  aortic valve replacement, a discussion has been held regarding what types of management strategies would be attempted intraoperatively in the event of life-threatening complications, including whether or not the patient would be considered a  candidate for the use of cardiopulmonary bypass and/or conversion to open sternotomy for attempted surgical intervention.  The patient has been advised of a variety of complications that might develop including but not limited to risks of death, stroke, paravalvular leak, aortic dissection or other major vascular complications, aortic annulus rupture, device embolization, cardiac rupture or perforation, mitral regurgitation, acute myocardial infarction, arrhythmia, heart block or bradycardia requiring permanent pacemaker placement, congestive heart failure, respiratory failure, renal failure, pneumonia, infection, other late complications related to structural valve deterioration or migration, or other complications that might ultimately cause a temporary or permanent loss of functional independence or other long term morbidity.  The patient provides full informed consent for the procedure as described and all questions were answered.    I spent in excess of 90 minutes during the conduct of this office consultation and >50% of this time involved direct face-to-face encounter with the patient for counseling and/or coordination of their care.   Valentina Gu. Roxy Manns, MD 08/22/2018 3:25 PM   Addendum:  I have personally reviewed the patient's transthoracic echocardiogram performed July 02, 2018 at Dr. Irven Shelling office.  Patient has normal left ventricular size and systolic function.  There is moderate concentric hypertrophy with significant basal septal hypertrophy.  Ejection fraction was estimated 65%.  There is severe aortic stenosis.  The aortic valve is trileaflet.  The right coronary leaflet is heavily calcified and essentially immobile.  The left and noncoronary  leaflets are both thickened and calcified with restricted leaflet mobility.  Peak velocity across the aortic valve consistently measured well above 4.1 m/s corresponding to mean transvalvular gradient estimated 46 mmHg.  There was trace aortic insufficiency.  There was mild mitral regurgitation.  No other significant abnormalities were noted.   Rexene Alberts, MD 08/23/2018 4:05 PM

## 2018-08-22 NOTE — Progress Notes (Addendum)
HEART AND [place] SURGERY CONSULTATION REPORT  Referring Provider is Adrian Prows, MD PCP is Jani Gravel, MD  Chief Complaint  Patient presents with  . Aortic Stenosis    TAVR CONSULT...ECHO 07/02/18 @ DR. GANGI'S    HPI:  Patient is an 84 year old female with history of aortic stenosis, hypertension, and hypercholesterolemia who has been referred for surgical consultation to discuss treatment options for management of severe symptomatic aortic stenosis.    Patient states that she was first noted to have a heart murmur on physical exam by her primary care physician several years ago.  Echocardiogram revealed the presence and she was referred to Dr. Einar Gip who has been following her carefully ever since.  Recent follow-up echocardiogram reportedly demonstrated the presence of severe aortic stenosis with normal left ventricular systolic function.  Patient has developed exertional shortness of breath and fatigue.  She subsequently underwent diagnostic cardiac catheterization which revealed normal coronary arteries and no significant coronary artery disease.  She had moderate pulmonary hypertension.  Transvalvular gradient across the aortic valve was not assessed.  Patient was referred to the multidisciplinary heart valve clinic and is been evaluated previously by Dr. Angelena Form.  Transcatheter aortic valve replacement was discussed as an alternative to conventional surgery.  CT angiography was performed and the patient referred for surgical consultation.  The patient is married and lives locally in [place] with her husband.  She has 4 adult children and 10 grandchildren.  She enjoys gardening and remains entirely functionally independent.  Approximately 2 years ago she had knee replacement surgery on one side and then the following year again on the other.  Since that time she has not been quite as active physically, although she has  recovered uneventfully and reports no limitations related to arthritis.  She does note that she now gets short of breath with moderate level activity.  She denies any resting shortness of breath, PND, orthopnea, or lower extremity edema.  She has not had any exertional chest pain.  Recently she has been having some mild tightness across her chest which she blames on anxiety.  She denies any palpitations, dizzy spells, or syncope.  Past Medical History:  Diagnosis Date  . Arthritis    some - per patient  . Cancer [facility])    breast cancer / left   . Cataract    bilat   . GERD (gastroesophageal reflux disease)   . Heart murmur   . History of bronchitis   . History of kidney stones   . Hyperlipidemia   . Hypertension   . Hypothyroidism   . Macular degeneration    Left  . Nasal congestion   . Shortness of breath dyspnea    increased exertion   . Stress incontinence   . Thyroid disease   . Tinnitus   . Wears glasses     Past Surgical History:  Procedure Laterality Date  . ABDOMINAL HYSTERECTOMY  1970's  . BREAST LUMPECTOMY  12/1998   lumpectomy  . CARDIAC CATHETERIZATION    . EYE SURGERY     cataract surgery bilat   . LITHOTRIPSY    . Right total knee     2018 Dr. Alvan Dame  . RIGHT/LEFT HEART CATH AND CORONARY ANGIOGRAPHY N/A 08/06/2018   Procedure: RIGHT/LEFT HEART CATH AND CORONARY ANGIOGRAPHY;  Surgeon: Adrian Prows, MD;  Location: [facility];  Service: Cardiovascular;  Laterality: N/A;  . THYROIDECTOMY, PARTIAL  1975  . TONSILLECTOMY  as a child - patient not sure of exact date  . TOTAL KNEE ARTHROPLASTY Left 03/13/2016   Procedure: TOTAL KNEE ARTHROPLASTY;  Surgeon: Paralee Cancel, MD;  Location: [facility] OR[facility];  Service: Orthopedics;  Laterality: Left;  . TOTAL KNEE ARTHROPLASTY Right 06/18/2017   Procedure: RIGHT TOTAL KNEE ARTHROPLASTY;  Surgeon: Paralee Cancel, MD;  Location: [facility];  Service: Orthopedics;  Laterality: Right;    Family History  Problem Relation Age of Onset   . Diabetes Mother   . Stroke Mother        Carotid artery disease  . Heart disease Father        CAD    Social History   Socioeconomic History  . Marital status: Married    Spouse name: Not on file  . Number of children: 4  . Years of education: Not on file  . Highest education level: Not on file  Occupational History  . 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 Financial resource strain: Not on file  . Food insecurity:    Worry: Not on file    Inability: Not on file  . Transportation needs:    Medical: Not on file    Non-medical: Not on file  Tobacco Use  . Smoking status: Never Smoker  . Smokeless tobacco: Never Used  Substance and Sexual Activity  . Alcohol use: No  . Drug use: No  . Sexual activity: Not Currently  Lifestyle  . Physical activity:    Days per week: Not on file    Minutes per session: Not on file  . Stress: Not on file  Relationships  . Social connections:    Talks on phone: Not on file    Gets together: Not on file    Attends religious service: Not on file    Active member of club or organization: Not on file    Attends meetings of clubs or organizations: Not on file    Relationship status: Not on file  . Intimate partner violence:    Fear of current or ex partner: Not on file    Emotionally abused: Not on file    Physically abused: Not on file    Forced sexual activity: Not on file  Other Topics Concern  . Not on file  Social History Narrative  . Not on file    Current Outpatient Medications  Medication Sig Dispense Refill  . Artificial Tear Solution (SOOTHE XP) SOLN Place 1 drop into both eyes every evening.    . carvedilol (COREG) 12.5 MG tablet Take 12.5 mg by mouth 2 (two) times daily with a meal.    . Cholecalciferol (VITAMIN D3) 2000 units TABS Take 2,000 Units by mouth daily.    . hydrochlorothiazide (HYDRODIURIL) 25 MG tablet Take 25 mg by mouth daily.  4  . levothyroxine (SYNTHROID,  LEVOTHROID) 100 MCG tablet Take 100 mcg by mouth daily before breakfast.  2  . losartan (COZAAR) 25 MG tablet Take 50 mg by mouth daily.     . Multiple Vitamins-Minerals (PRESERVISION AREDS 2) CAPS Take 1 tablet by mouth 2 (two) times daily.    Marland Kitchen omeprazole (PRILOSEC) 20 MG capsule Take 20 mg by mouth daily.    . pravastatin (PRAVACHOL) 40 MG tablet Take 40 mg by mouth every evening.     No current facility-administered medications for this visit.     Allergies  Allergen Reactions  . Penicillins Other (See Comments)    Patient does not  remember reaction. Happened many years ago. Has patient had a PCN reaction causing immediate rash, facial/tongue/throat swelling, SOB or lightheadedness with hypotension: no Has patient had a PCN reaction causing severe rash involving mucus membranes or skin necrosis: no Has patient had a PCN reaction that required hospitalization no Has patient had a PCN reaction occurring within the last 10 years: no If all of the above answers are "NO", then may proceed with Cephalosporin use.   . Sulfa Antibiotics     Unknown, maybe vision issues      Review of Systems:   General:  normal appetite, decreased energy, no weight gain, no weight loss, no fever  Cardiac:  no chest pain with exertion, occasional chest pain at rest, + SOB with exertion, no resting SOB, no PND, no orthopnea, no palpitations, no arrhythmia, no atrial fibrillation, no LE edema, no dizzy spells, no syncope  Respiratory:  + exertional shortness of breath, no home oxygen, occasional productive cough, no dry cough, no bronchitis, + wheezing, no hemoptysis, no asthma, no pain with inspiration or cough, no sleep apnea, no CPAP at night  GI:   no difficulty swallowing, no reflux, no frequent heartburn, no hiatal hernia, no abdominal pain, no constipation, no diarrhea, no hematochezia, no hematemesis, no melena  GU:   no dysuria,  + frequency, no urinary tract infection, no hematuria, no kidney  stones, no kidney disease  Vascular:  no pain suggestive of claudication, no pain in feet, + occasional leg cramps, no varicose veins, no DVT, no non-healing foot ulcer  Neuro:   no stroke, no TIA's, no seizures, no headaches, no temporary blindness one eye,  no slurred speech, no peripheral neuropathy, no chronic pain, no instability of gait, no memory/cognitive dysfunction  Musculoskeletal: + arthritis, no joint swelling, no myalgias, no difficulty walking, normal mobility   Skin:   no rash, no itching, no skin infections, no pressure sores or ulcerations  Psych:   no anxiety, no depression, no nervousness, no unusual recent stress  Eyes:   + blurry vision, no floaters, no recent vision changes, + wears glasses or contacts  ENT:   no hearing loss, no loose or painful teeth, no dentures, last saw dentist within the past year  Hematologic:  no easy bruising, no abnormal bleeding, no clotting disorder, no frequent epistaxis  Endocrine:  no diabetes, does not check CBG's at home           Physical Exam:   BP (!) 145/73 (BP Location: Right Arm, Patient Position: Sitting, Cuff Size: Large)   Pulse 70   Resp 16   Ht 5\' 4"  (1.626 m)   Wt 177 lb (80.3 kg)   SpO2 94% Comment: ON RA....JUST COMPLETED XERCISE STUDY  BMI 30.38 kg/m   General:  Moderately obese,  well-appearing  HEENT:  Unremarkable   Neck:   no JVD, no bruits, no adenopathy   Chest:   clear to auscultation, symmetrical breath sounds, no wheezes, no rhonchi   CV:   RRR, grade III/VI crescendo/decrescendo murmur heard best at RUSB, no diastolic murmur  Abdomen:  soft, non-tender, no masses   Extremities:  warm, well-perfused, pulses diminished but palpable, no LE edema  Rectal/GU  Deferred  Neuro:   Grossly non-focal and symmetrical throughout  Skin:   Clean and dry, no rashes, no breakdown   Diagnostic Tests:  TRANSTHORACIC ECHOCARDIOGRAM  Abbreviated report from transthoracic echocardiogram performed July 02, 2018  at Dr. Irven Shelling office is reviewed.  The official report and  images from this study are not currently available.  By report there was normal left ventricular size and systolic function with ejection fraction estimated 65%.  The aortic valve was reportedly thickened and calcified with restricted leaflet mobility.  Mean transvalvular gradient was estimated 46 mmHg corresponding to aortic valve area calculated 0.65 cm.  There was reportedly trace aortic insufficiency, mild mitral regurgitation, and mild tricuspid regurgitation.    EKG: NSR w/out acute ischemic changes, no conduction system disease    RIGHT/LEFT HEART CATH AND CORONARY ANGIOGRAPHY  Conclusion     Hemodynamic findings consistent with moderate pulmonary hypertension.    Procedure performed: Right heart catheterization, selective left and right coronary arteriogram.  Indication severe aortic stenosis. Dyspnea on exertion.  Normal coronary arteries, right dominant circulation.  Left ventriculogram not performed. Moderate pulmonary hypertension due to elevated LVEDP, WHO group 2. RA 7/7, mean 5 mmHg; RV 49/1, EDP 7 mmHg; PA 54/24, mean 36 mmHg, PA saturation 74%.  PW 21/26, mean 20 mmHg.  Aortic saturation 95%. CO 7.51, CI 4.05 by Fick. No indication for antiplatelet therapy at this time.  Indications   Nonrheumatic aortic valve stenosis [I35.0 (ICD-10-CM)]  Dyspnea on exertion [R06.09 (ICD-10-CM)]  Procedural Details/Technique   Technical Details Procedure performed: Procedure performed: Right heart catheterization, selective left and right coronary arteriogram. Indication:  She has history of breast cancer more than 20 years ago and has had lumpectomy, hypertension, hyperlipidemia, asymptomatic mild to moderate carotid artery disease. Recently had echocardiogram due to shortness of breath which revealed severe aortic stenosis with mean gradient of 46 and a peak gradient of 70 mmHg and valve area was palpated at 0.65 cm.  She is now being evaluated for TAVR. She is now brought to the cardiac catheterization lab for right and left heart catheterization prior to planned valvular surgery/procedure.  Technique: Right AC vein access for right heart and Right radial access for left heart catheterization was utilized for performing the procedure.   Technique:   A 5 French sheath introduced into right right AC vein for access under fluroscopic guidance. A 5 French Swan-Ganz catheter was advanced with balloon inflated under fluoroscopic guidance into first the right atrium followed by the right ventricle and into the pulmonary artery to pulmonary artery wedge position. Hemodynamics were obtained in a locations.  After hemodynamics were completed, samples were taken for SaO2% measurement to be used in Fick Calculation of cardiac output and cardiac index. The catheter was then pulled back the balloon down and then completely out of the body. Hemostasis obtained by manual pressure over the access site.  Under sterile precautions using a 6 French right radial arterial access, a 6 French sheath was introduced into the right radial artery. A 5 [place] Tig 4 catheter was advanced into the ascending aorta selective right coronary artery and left coronary artery was cannulated and angiography was performed in multiple views. The catheter was pulled back Out of the body over exchange length J-wire. Catheter exchanged out of the body over J-Wire. NO immediate complications noted. Patient tolerated the procedure well. Contrast used: 45mL.   Estimated blood loss <50 mL.  During this procedure the patient was administered the following to achieve and maintain moderate conscious sedation: Versed 2 mg, Fentanyl 50 mcg, while the patient's heart rate, blood pressure, and oxygen saturation were continuously monitored. The period of conscious sedation was 23 minutes, of which I was present face-to-face 100% of this time.  Coronary Findings    Diagnostic  Dominance: Right  Left  Main  Vessel was injected. Vessel is normal in caliber. Vessel is angiographically normal.  Left Anterior Descending  Vessel was injected. Vessel is normal in caliber. Vessel is angiographically normal.  Left Circumflex  Vessel was injected. Vessel is normal in caliber. Vessel is angiographically normal.  Right Coronary Artery  Vessel was injected. Vessel is normal in caliber. Vessel is angiographically normal.  Intervention   No interventions have been documented.  Right Heart   Right Heart Pressures Hemodynamic findings consistent with moderate pulmonary hypertension.  Coronary Diagrams   Diagnostic Diagram       Implants    No implant documentation for this case.  MERGE Images   Show images for CARDIAC CATHETERIZATION   Link to Procedure Log   Procedure Log    Hemo Data    Most Recent Value  Fick Cardiac Output 7.51 L/min  Fick Cardiac Output Index 4.05 (L/min)/BSA  RA A Wave 7 mmHg  RA V Wave 7 mmHg  RA Mean 5 mmHg  RV Systolic Pressure 49 mmHg  RV Diastolic Pressure 1 mmHg  RV EDP 7 mmHg  PA Systolic Pressure 54 mmHg  PA Diastolic Pressure 24 mmHg  PA Mean 36 mmHg  PW A Wave 21 mmHg  PW V Wave 26 mmHg  PW Mean 20 mmHg  AO Systolic Pressure 409 mmHg  AO Diastolic Pressure 71 mmHg  AO Mean 104 mmHg  QP/QS 1  TPVR Index 8.89 HRUI  TSVR Index 25.68 HRUI  PVR SVR Ratio 0.16  TPVR/TSVR Ratio 0.35    Cardiac TAVR CT  TECHNIQUE: The patient was scanned on a Siemens Force 811 slice scanner. A 120 kV retrospective scan was triggered in the descending thoracic aorta at 111 HU's. Gantry rotation speed was 270 msecs and collimation was .9 mm. No beta blockade or nitro were given. The 3D data set was reconstructed in 5% intervals of the R-R cycle. Systolic and diastolic phases were analyzed on a dedicated work station using MPR, MIP and VRT modes. The patient received 80 cc of contrast.  FINDINGS: Aortic Valve:  Tri leaflet calcified with restricted leaflet motion  Aorta: Mild calcific atherosclerosis with normal arch vessels  Sinotubular Junction: 23 mm  Ascending Thoracic Aorta: 28 mm  Aortic Arch: 24 mm  Descending Thoracic Aorta: 23 mm  Sinus of Valsalva Measurements:  Non-coronary: 27 mm  Right - coronary: 26.4 mm  Left - coronary: 27.2 mm  Coronary Artery Height above Annulus:  Left Main: 11 mm above annulus  Right Coronary: 14 mm above annulus  Virtual Basal Annulus Measurements:  Maximum/Minimum Diameter: 18 x 25 mm  Perimeter: 70.6 mm  Area: 373 mm2  Coronary Arteries: Sufficient height above annulus for deployment  Optimum Fluoroscopic Angle for Delivery: LAO 25 Caudal 2 degrees  IMPRESSION: 1. Calcified tri leaflet AV with annular area of 373 mm2 suitable for a 23 mm Sapien 3 valve  2.  Coronary arteries sufficient height above annulus for deployment  3.  Normal aortic  root 2.8 cm  4. Optimum angiographic angle for deployment LAO 25 Caudal 2 degrees  5.  No LAA thrombus  Jenkins Rouge   Electronically Signed   By: Jenkins Rouge M.D.   On: 08/15/2018 14:53   CT ANGIOGRAPHY CHEST, ABDOMEN AND PELVIS  TECHNIQUE: Multidetector CT imaging through the chest, abdomen and pelvis was performed using the standard protocol during bolus administration of intravenous contrast. Multiplanar reconstructed images and MIPs were obtained and reviewed to evaluate the vascular anatomy.  CONTRAST:  172mL ISOVUE-370 IOPAMIDOL (ISOVUE-370) INJECTION 76%  COMPARISON:  Chest CT 07/31/2006. No prior CT the abdomen and pelvis.  FINDINGS: CTA CHEST FINDINGS  Cardiovascular: Heart size is enlarged with concentric left ventricular hypertrophy and biatrial dilatation. There is no significant pericardial fluid, thickening or pericardial calcification. Aortic atherosclerosis. No definite coronary artery calcifications. Severe thickening  calcification of the aortic valve. Severe calcifications of the mitral annulus.  Mediastinum/Lymph Nodes: Multiple prominent borderline enlarged and mildly enlarged mediastinal and hilar lymph nodes, largest of which measures up to 18 mm in the subcarinal nodal station and 17 mm in the low right paratracheal nodal station. Esophagus is unremarkable in appearance. No axillary lymphadenopathy.  Lungs/Pleura: Widespread ground-glass attenuation and interlobular septal thickening, favored to reflect a background of mild interstitial pulmonary edema. Scattered linear areas, most compatible with areas of post infectious or inflammatory scarring or atelectasis. Small right and trace left pleural effusions lying dependently. No confluent consolidative airspace disease. No definite suspicious appearing pulmonary nodules or masses are noted.  Musculoskeletal/Soft Tissues: Chronic T12 compression fracture with 10% loss of anterior vertebral body height and post vertebroplasty changes. There are no aggressive appearing lytic or blastic lesions noted in the visualized portions of the skeleton.  CTA ABDOMEN AND PELVIS FINDINGS  Hepatobiliary: Large lesion in the liver, centered predominantly in segment 8 measuring 5.4 x 7.8 cm (axial image 81 of series 7) which is centrally low-attenuation with avid peripheral nodular enhancement on the arterial phase images obtained, similar in size to a low-attenuation lesion on study from 2007, most compatible with a large cavernous hemangioma. In addition, there is a 13 x 10 mm hypervascular lesion also in segment 8 (axial image 71 of series 7), with surrounding perfusion anomaly, which is also similar in retrospect to prior study from 2007, presumably a smaller flash fill cavernous hemangioma. No other suspicious hepatic lesions. No intra or extrahepatic biliary ductal dilatation. Gallbladder is normal in appearance.  Pancreas: No pancreatic mass.  No pancreatic ductal dilatation. No pancreatic or peripancreatic fluid or inflammatory changes.  Spleen: Unremarkable.  Adrenals/Urinary Tract: Bilateral kidneys and adrenal glands are normal in appearance. No hydroureteronephrosis. Small amount of gas non dependently in the lumen of the urinary bladder. Urinary bladder is otherwise unremarkable in appearance.  Stomach/Bowel: Normal appearance of the stomach. No pathologic dilatation of small bowel or colon. A few scattered colonic diverticulae are noted, particularly in the sigmoid colon, without surrounding inflammatory changes to suggest an acute diverticulitis at this time. Normal appendix.  Vascular/Lymphatic: Aortic atherosclerosis, without evidence of aneurysm or dissection in the abdominal or pelvic vasculature. Vascular findings and measurements pertinent to potential TAVR procedure, as detailed below. No lymphadenopathy noted in the abdomen or pelvis.  Reproductive: Status post total abdominal hysterectomy. Ovaries are unremarkable in appearance.  Other: No significant volume of ascites.  No pneumoperitoneum.  Musculoskeletal: There are no aggressive appearing lytic or blastic lesions noted in the visualized portions of the skeleton.  VASCULAR MEASUREMENTS PERTINENT TO TAVR:  AORTA:  Minimal Aortic Diameter-12 x 11 mm  Severity of Aortic Calcification-severe  RIGHT PELVIS:  Right Common Iliac Artery -  Minimal Diameter-10.3 x 8.0 mm  Tortuosity-mild  Calcification-mild  Right External Iliac Artery -  Minimal Diameter-7.2 x 7.4 mm  Tortuosity - mild  Calcification-none  Right Common Femoral Artery -  Minimal Diameter-7.8 x 7.1 mm  Tortuosity - mild  Calcification-mild  LEFT PELVIS:  Left Common Iliac Artery -  Minimal Diameter-8.4 x 9.1 mm  Tortuosity - mild  Calcification-mild  Left External Iliac Artery -  Minimal Diameter-7.3 x 8.1 mm  Tortuosity  - mild  Calcification-none  Left Common Femoral Artery -  Minimal Diameter-7.5 x 7.3 mm  Tortuosity - mild  Calcification-mild  Review of the MIP images confirms the above findings.  IMPRESSION: 1. Vascular findings and measurements pertinent to potential TAVR procedure, as detailed above. 2. Severe thickening calcification of the aortic valve, compatible with the reported clinical history of severe aortic stenosis. 3. Cardiomegaly with concentric left ventricular hypertrophy and biatrial dilatation. There is also evidence of interstitial pulmonary edema in the lungs bilaterally and bilateral pleural effusions (small on the right and trace on the left), suggesting congestive heart failure. 4. Multiple borderline enlarged and mildly enlarged mediastinal and hilar lymph nodes, nonspecific in the setting of presumed congestive heart failure. No specific findings to strongly suggest malignancy on today's examination. 5. Mild colonic diverticulosis without evidence of acute diverticulitis at this time. 6. Two hypervascular liver lesions, as above, compatible with cavernous hemangiomas. 7. Additional incidental findings, as above.   Electronically Signed   By: Vinnie Langton M.D.   On: 08/15/2018 15:33    Impression:  Patient has stage D severe symptomatic aortic stenosis.  She describes gradual progression of symptoms of exertional shortness of breath and fatigue consistent with chronic diastolic congestive heart failure, New York Heart Association functional class II.  Report from transthoracic echocardiogram performed recently Dr. Irven Shelling office is consistent with the presence of severe aortic stenosis.  I have personally reviewed the patient's recent diagnostic cardiac catheterization and CT angiograms.  Catheterization reveals normal coronary artery anatomy with no significant coronary artery disease.  There was mild to moderate pulmonary hypertension.   Transvalvular gradient across the aortic valve was not assessed.  Cardiac-gated CTA of the heart reveals anatomical characteristics consistent with aortic stenosis suitable for treatment by transcatheter aortic valve replacement without any significant complicating features and CTA of the aorta and iliac vessels demonstrate what appears to be adequate pelvic vascular access to facilitate a transfemoral approach.  I agree the patient would likely benefit from aortic valve replacement.  Risks associated with conventional surgery would be mild to moderately elevated because of the patient's advanced age.  Transcatheter aortic valve replacement would be reasonable alternative to conventional surgery.    Plan:  The patient and her husband were counseled at length regarding treatment alternatives for management of severe symptomatic aortic stenosis. Alternative approaches such as conventional aortic valve replacement, transcatheter aortic valve replacement, and continued medical therapy without intervention were compared and contrasted at length.  The risks associated with conventional surgical aortic valve replacement were discussed in detail, as were expectations for post-operative convalescence.  Issues specific to transcatheter aortic valve replacement were discussed including questions about long term valve durability, the potential for paravalvular leak, possible increased risk of need for permanent pacemaker placement, and other technical complications related to the procedure itself.  Long-term prognosis with medical therapy was discussed. This discussion was placed in the context of the patient's own specific clinical presentation and past medical history.  All of their questions have been addressed.  The patient hopes to proceed with transcatheter aortic valve replacement as soon as practical.  We tentatively plan for surgery on September 03, 2018.  Following the decision to proceed with transcatheter  aortic valve replacement, a discussion has been held regarding what types of management strategies would be attempted intraoperatively in the event of life-threatening complications, including whether or not the patient would be considered a  candidate for the use of cardiopulmonary bypass and/or conversion to open sternotomy for attempted surgical intervention.  The patient has been advised of a variety of complications that might develop including but not limited to risks of death, stroke, paravalvular leak, aortic dissection or other major vascular complications, aortic annulus rupture, device embolization, cardiac rupture or perforation, mitral regurgitation, acute myocardial infarction, arrhythmia, heart block or bradycardia requiring permanent pacemaker placement, congestive heart failure, respiratory failure, renal failure, pneumonia, infection, other late complications related to structural valve deterioration or migration, or other complications that might ultimately cause a temporary or permanent loss of functional independence or other long term morbidity.  The patient provides full informed consent for the procedure as described and all questions were answered.    I spent in excess of 90 minutes during the conduct of this office consultation and >50% of this time involved direct face-to-face encounter with the patient for counseling and/or coordination of their care.   Valentina Gu. Roxy Manns, MD 08/22/2018 3:25 PM   Addendum:  I have personally reviewed the patient's transthoracic echocardiogram performed July 02, 2018 at Dr. Irven Shelling office.  Patient has normal left ventricular size and systolic function.  There is moderate concentric hypertrophy with significant basal septal hypertrophy.  Ejection fraction was estimated 65%.  There is severe aortic stenosis.  The aortic valve is trileaflet.  The right coronary leaflet is heavily calcified and essentially immobile.  The left and noncoronary  leaflets are both thickened and calcified with restricted leaflet mobility.  Peak velocity across the aortic valve consistently measured well above 4.1 m/s corresponding to mean transvalvular gradient estimated 46 mmHg.  There was trace aortic insufficiency.  There was mild mitral regurgitation.  No other significant abnormalities were noted.   Rexene Alberts, MD 08/23/2018 4:05 PM

## 2018-08-22 NOTE — Therapy (Signed)
[place], [place], [address] Phone: [phone]   Fax:  [phone]  Physical Therapy Evaluation  Patient Details  Name: Zoe [name] MRN: [id number] Date of Birth: [birth year] Referring Provider (PT): Dr Lauree Chandler    Encounter Date: 08/22/2018  PT End of Session - 08/22/18 1500    Visit Number  1    Number of Visits  1    Date for PT Re-Evaluation  08/29/18    Authorization Type  [employer] medicare     PT Start Time  5732    PT Stop Time  1435    PT Time Calculation (min)  30 min    Activity Tolerance  Patient tolerated treatment well    Behavior During Therapy  [facility] for tasks assessed/performed       Past Medical History:  Diagnosis Date  . Arthritis    some - per patient  . Cancer [facility])    breast cancer / left   . Cataract    bilat   . GERD (gastroesophageal reflux disease)   . Heart murmur   . History of bronchitis   . History of kidney stones   . Hyperlipidemia   . Hypertension   . Hypothyroidism   . Macular degeneration    Left  . Nasal congestion   . Shortness of breath dyspnea    increased exertion   . Stress incontinence   . Thyroid disease   . Tinnitus   . Wears glasses     Past Surgical History:  Procedure Laterality Date  . ABDOMINAL HYSTERECTOMY  1970's  . BREAST LUMPECTOMY  12/1998   lumpectomy  . CARDIAC CATHETERIZATION    . EYE SURGERY     cataract surgery bilat   . LITHOTRIPSY    . Right total knee     2018 Dr. Alvan Dame  . RIGHT/LEFT HEART CATH AND CORONARY ANGIOGRAPHY N/A 08/06/2018   Procedure: RIGHT/LEFT HEART CATH AND CORONARY ANGIOGRAPHY;  Surgeon: Adrian Prows, MD;  Location: [facility];  Service: Cardiovascular;  Laterality: N/A;  . THYROIDECTOMY, PARTIAL  1975  . TONSILLECTOMY     as a child - patient not sure of exact date  . TOTAL KNEE ARTHROPLASTY Left 03/13/2016   Procedure: TOTAL KNEE ARTHROPLASTY;  Surgeon: Paralee Cancel, MD;   Location: [facility];  Service: Orthopedics;  Laterality: Left;  . TOTAL KNEE ARTHROPLASTY Right 06/18/2017   Procedure: RIGHT TOTAL KNEE ARTHROPLASTY;  Surgeon: Paralee Cancel, MD;  Location: [facility];  Service: Orthopedics;  Laterality: Right;    There were no vitals filed for this visit.   Subjective Assessment - 08/22/18 1407    Subjective  The patient has been having shortness of breath for about a year. She used to walk a lot but began to become short of breath with any walking distance. She also has had chest pain at times but that comes and goes.     Currently in Pain?  No/denies         [facility] PT Assessment - 08/22/18 0001      Assessment   Medical Diagnosis  Severe Aortic Stenosis     Referring Provider (PT)  Dr Lauree Chandler     Hand Dominance  Right    Next MD Visit  today     Prior Therapy  for her knees       Precautions   Precautions  None      Restrictions   Weight Bearing Restrictions  No      Balance Screen   Has the patient fallen in the past 6 months  No    Has the patient had a decrease in activity level because of a fear of falling?   No    Is the patient reluctant to leave their home because of a fear of falling?   No      Home Environment   Additional Comments  No steps into her house       Prior Function   Level of Independence  Independent    Vocation  Retired    Leisure  walking       Cognition   Overall Cognitive Status  Within Functional Limits for tasks assessed    Attention  Focused    Focused Attention  Appears intact    Memory  Appears intact    Awareness  Appears intact    Problem Solving  Appears intact      Sensation   Additional Comments  denies parathesias       Coordination   Gross Motor Movements are Fluid and Coordinated  Yes    Fine Motor Movements are Fluid and Coordinated  Yes      ROM / Strength   AROM / PROM / Strength  AROM;PROM;Strength      AROM   Overall AROM   Within functional limits for tasks performed       Strength   Overall Strength  Within functional limits for tasks performed    Strength Assessment Site  Hand    Right/Left hand  Right;Left    Right Hand Grip (lbs)  30    Left Hand Grip (lbs)  25       [facility] Pre-Surgical Assessment - 08/22/18 0001    5 Meter Walk Test- trial 1  7 sec    5 Meter Walk Test- trial 2  6 sec.     5 Meter Walk Test- trial 3  6 sec.    5 meter walk test average  6.33 sec    4 Stage Balance Test tolerated for:   4 sec.    4 Stage Balance Test Position  4    comment  some difficulty with singleleg stance on the right.     Sit To Stand Test- trial 1  5 sec.    Comment  20 sec     ADL/IADL Independent with:  Bathing;Dressing;Meal prep;Yard work;Finances    6 Minute Walk- Baseline  yes    BP (mmHg)  170/77    HR (bpm)  67    02 Sat (%RA)  95 %    Modified Borg Scale for Dyspnea  0- Nothing at all    Perceived Rate of Exertion (Borg)  6-    6 Minute Walk Post Test  yes    BP (mmHg)  173/67    HR (bpm)  89    02 Sat (%RA)  95 %    Modified Borg Scale for Dyspnea  0- Nothing at all    Perceived Rate of Exertion (Borg)  13- Somewhat hard    Aerobic Endurance Distance Walked  380       Clinical Impression Statement: Pt is a 84 yo female presenting to OP PT for evaluation prior to possible TAVR surgery due to severe aortic stenosis. Pt reports onset of dyspnea with ambaultion approximately 12 months ago. Symptoms are limiting er ability to perform daily activity.Marland Kitchen Pt presents with norma ROM and strength, good balance and  is a low fall risk 4 stage balance test, decreased walking speed and decreased aerobic endurance per 6 minute walk test. Pt ambulated 185 feet in *1:2- before requesting a seated rest beak lasting 1:30. At time of rest, patient's HR was 85 bpm and O2 was 95 on room air. Pt reported 0/10 shortness of breath on modified scale for dyspnea. Pt able to resume after rest and ambulate an additional 185 feet in 3:40 before requiring a standing rest  lasting another minute. She then was bale to walk an extra 20 ft.. Pt ambulated a total of 380 feet in 6 minute walk. B/P did not  icrease significantly with 6 minute walk test. Based on the Short Physical Performance Battery, patient has a frailty rating of 8/12 with </= 5/12 considered frail.        Objective measurements completed on examination: See above findings.                           Plan - 08/22/18 1500    Clinical Impression Statement  see below     Clinical Presentation  Stable    Clinical Decision Making  Low    PT Frequency  One time visit    Consulted and Agree with Plan of Care  Patient       Patient will benefit from skilled therapeutic intervention in order to improve the following deficits and impairments:  Decreased endurance  Visit Diagnosis: Other abnormalities of gait and mobility     Problem List Patient Active Problem List   Diagnosis Date Noted  . Severe aortic stenosis 08/05/2018  . S/P right TKA 06/18/2017  . Obese 03/15/2016  . S/P left TKA 03/13/2016  . History of breast cancer, DCIS, lumpectomy March 2000 12/13/2011    Carney Living PT DPT  08/22/2018, 3:06 PM     [facility] [address] [place], [place], [address] Phone: [phone]   Fax:  [phone]  Name: Zoe [name] MRN: [id number] Date of Birth: 08/13/1934

## 2018-08-26 ENCOUNTER — Other Ambulatory Visit: Payer: Self-pay

## 2018-08-26 ENCOUNTER — Encounter: Payer: Self-pay | Admitting: Physician Assistant

## 2018-08-26 DIAGNOSIS — I350 Nonrheumatic aortic (valve) stenosis: Secondary | ICD-10-CM

## 2018-08-29 NOTE — Pre-Procedure Instructions (Signed)
[place]  08/29/2018      [facility] [place], [place] [address] [place] [address] Phone: [phone] Fax: [phone]    Your procedure is scheduled on Tuesday November 12th.  Report to [facility] Admitting at [place].M.  Call this number if you have problems the morning of surgery:  [phone]   Remember:  Do not eat or drink after midnight.    Take these medicines the morning of surgery with A SIP OF WATER   levothyroxine (SYNTHROID, LEVOTHROID)  omeprazole (PRILOSEC)  7 days prior to surgery STOP taking any Aleve, Naproxen, Ibuprofen, Motrin, Advil, Goody's, BC's, all herbal medications, fish oil, and all vitamins     Do not wear jewelry, make-up or nail polish.  Do not wear lotions, powders, or perfumes, or deodorant.  Do not shave 48 hours prior to surgery.  Men may shave face and neck.  Do not bring valuables to the hospital.  [facility] is not responsible for any belongings or valuables.  Contacts, dentures or bridgework may not be worn into surgery.  Leave your suitcase in the car.  After surgery it may be brought to your room.  For patients admitted to the hospital, discharge time will be determined by your treatment team.  Patients discharged the day of surgery will not be allowed to drive home.    [facility]- Preparing For Surgery  Before surgery, you can play an important role. Because skin is not sterile, your skin needs to be as free of germs as possible. You can reduce the number of germs on your skin by washing with CHG (chlorahexidine gluconate) Soap before surgery.  CHG is an antiseptic cleaner which kills germs and bonds with the skin to continue killing germs even after washing.    Oral Hygiene is also important to reduce your risk of infection.  Remember - BRUSH YOUR TEETH THE MORNING OF SURGERY WITH YOUR REGULAR TOOTHPASTE  Please do not use if you have an allergy to CHG or  antibacterial soaps. If your skin becomes reddened/irritated stop using the CHG.  Do not shave (including legs and underarms) for at least 48 hours prior to first CHG shower. It is OK to shave your face.  Please follow these instructions carefully.   1. Shower the NIGHT BEFORE SURGERY and the MORNING OF SURGERY with CHG.   2. If you chose to wash your hair, wash your hair first as usual with your normal shampoo.  3. After you shampoo, rinse your hair and body thoroughly to remove the shampoo.  4. Use CHG as you would any other liquid soap. You can apply CHG directly to the skin and wash gently with a scrungie or a clean washcloth.   5. Apply the CHG Soap to your body ONLY FROM THE NECK DOWN.  Do not use on open wounds or open sores. Avoid contact with your eyes, ears, mouth and genitals (private parts). Wash Face and genitals (private parts)  with your normal soap.  6. Wash thoroughly, paying special attention to the area where your surgery will be performed.  7. Thoroughly rinse your body with warm water from the neck down.  8. DO NOT shower/wash with your normal soap after using and rinsing off the CHG Soap.  9. Pat yourself dry with a CLEAN TOWEL.  10. Wear CLEAN PAJAMAS to bed the night before surgery, wear comfortable clothes the morning of surgery  11.  Place CLEAN SHEETS on your bed the night of your first shower and DO NOT SLEEP WITH PETS.    Day of Surgery:  Do not apply any deodorants/lotions.  Please wear clean clothes to the hospital/surgery center.   Remember to brush your teeth WITH YOUR REGULAR TOOTHPASTE.    Please read over the following fact sheets that you were given.

## 2018-08-30 ENCOUNTER — Other Ambulatory Visit: Payer: Self-pay

## 2018-08-30 ENCOUNTER — Encounter (HOSPITAL_COMMUNITY)
Admission: RE | Admit: 2018-08-30 | Discharge: 2018-08-30 | Disposition: A | Discharge status: Home / Self Care | Payer: Medicare Other | Source: Ambulatory Visit | Attending: Cardiovascular Disease | Admitting: Cardiovascular Disease

## 2018-08-30 ENCOUNTER — Ambulatory Visit (HOSPITAL_COMMUNITY)
Admission: RE | Admit: 2018-08-30 | Discharge: 2018-08-30 | Disposition: A | Discharge status: Home / Self Care | Payer: Medicare Other | Source: Ambulatory Visit | Attending: Cardiovascular Disease | Admitting: Cardiovascular Disease

## 2018-08-30 ENCOUNTER — Encounter (HOSPITAL_COMMUNITY): Payer: Self-pay

## 2018-08-30 DIAGNOSIS — Z23 Encounter for immunization: Secondary | ICD-10-CM | POA: Diagnosis not present

## 2018-08-30 DIAGNOSIS — R9431 Abnormal electrocardiogram [ECG] [EKG]: Secondary | ICD-10-CM | POA: Diagnosis not present

## 2018-08-30 DIAGNOSIS — Z01818 Encounter for other preprocedural examination: Secondary | ICD-10-CM | POA: Diagnosis not present

## 2018-08-30 DIAGNOSIS — I350 Nonrheumatic aortic (valve) stenosis: Secondary | ICD-10-CM | POA: Insufficient documentation

## 2018-08-30 DIAGNOSIS — I517 Cardiomegaly: Secondary | ICD-10-CM | POA: Diagnosis not present

## 2018-08-30 HISTORY — DX: Nonrheumatic aortic (valve) stenosis: I35.0

## 2018-08-30 LAB — HEMOGLOBIN A1C
Hgb A1c MFr Bld: 5.5 % (ref 4.8–5.6)
Mean Plasma Glucose: 111.15 mg/dL

## 2018-08-30 LAB — COMPREHENSIVE METABOLIC PANEL
ALBUMIN: 2.7 g/dL — AB (ref 3.5–5.0)
ALT: 16 U/L (ref 0–44)
ANION GAP: 9 (ref 5–15)
AST: 24 U/L (ref 15–41)
Alkaline Phosphatase: 47 U/L (ref 38–126)
BILIRUBIN TOTAL: 0.6 mg/dL (ref 0.3–1.2)
BUN: 22 mg/dL (ref 8–23)
CHLORIDE: 102 mmol/L (ref 98–111)
CO2: 20 mmol/L — AB (ref 22–32)
Calcium: 8.7 mg/dL — ABNORMAL LOW (ref 8.9–10.3)
Creatinine, Ser: 0.78 mg/dL (ref 0.44–1.00)
GFR calc Af Amer: >60 mL/min (ref >60)
GFR calc non Af Amer: >60 mL/min (ref >60)
GLUCOSE: 125 mg/dL — AB (ref 70–99)
POTASSIUM: 3.8 mmol/L (ref 3.5–5.1)
SODIUM: 131 mmol/L — AB (ref 135–145)
Total Protein: 9.2 g/dL — ABNORMAL HIGH (ref 6.5–8.1)

## 2018-08-30 LAB — BRAIN NATRIURETIC PEPTIDE: B NATRIURETIC PEPTIDE 5: 165.7 pg/mL — AB (ref 0.0–100.0)

## 2018-08-30 LAB — CBC
HEMATOCRIT: 35.5 % — AB (ref 36.0–46.0)
Hemoglobin: 10.7 g/dL — ABNORMAL LOW (ref 12.0–15.0)
MCH: 26.3 pg (ref 26.0–34.0)
MCHC: 30.1 g/dL (ref 30.0–36.0)
MCV: 87.2 fL (ref 80.0–100.0)
Platelets: 227 10*3/uL (ref 150–400)
RBC: 4.07 MIL/uL (ref 3.87–5.11)
RDW: 16.5 % — AB (ref 11.5–15.5)
WBC: 5.7 10*3/uL (ref 4.0–10.5)
nRBC: 0 % (ref 0.0–0.2)

## 2018-08-30 LAB — URINALYSIS, ROUTINE W REFLEX MICROSCOPIC
Bilirubin Urine: NEGATIVE
Glucose, UA: NEGATIVE mg/dL
HGB URINE DIPSTICK: NEGATIVE
Ketones, ur: NEGATIVE mg/dL
Nitrite: NEGATIVE
Protein, ur: NEGATIVE mg/dL
SPECIFIC GRAVITY, URINE: 1.019 (ref 1.005–1.030)
pH: 5 (ref 5.0–8.0)

## 2018-08-30 LAB — PROTIME-INR
INR: 1.18
PROTHROMBIN TIME: 14.9 s (ref 11.4–15.2)

## 2018-08-30 LAB — ABO/RH: ABO/RH(D): O POS

## 2018-08-30 LAB — BLOOD GAS, ARTERIAL
Acid-Base Excess: 1.2 mmol/L (ref 0.0–2.0)
Bicarbonate: 25 mmol/L (ref 20.0–28.0)
DRAWN BY: 421801
FIO2: 21
O2 Saturation: 96.9 %
PATIENT TEMPERATURE: 98.6
PCO2 ART: 37.7 mmHg (ref 32.0–48.0)
PO2 ART: 85.6 mmHg (ref 83.0–108.0)
pH, Arterial: 7.436 (ref 7.350–7.450)

## 2018-08-30 LAB — SURGICAL PCR SCREEN
MRSA, PCR: NEGATIVE
Staphylococcus aureus: NEGATIVE

## 2018-08-30 LAB — APTT: aPTT: 31 seconds (ref 24–36)

## 2018-08-30 LAB — TYPE AND SCREEN
ABO/RH(D): O POS
ANTIBODY SCREEN: NEGATIVE

## 2018-08-30 NOTE — Progress Notes (Signed)
PCP - Dr. Jani Gravel  Cardiologist - Dr.  Prows  Chest x-ray - 08/30/18  EKG - 08/30/18  Stress Test - Denies  ECHO - 09/03/18  Cardiac Cath - 08/06/18 (E)  AICD- na PM- na LOOP- na  Sleep Study - Denies CPAP - None  LABS- 08/30/18: CBC, CMP, PT, PTT, BNP, ABG, T/S, PCR, UA  ASA- Denies  HA1C- 08/30/18  Anesthesia- Yes- cardiac history  Pt denies having chest pain, sob, or fever at this time. All instructions explained to the pt, with a verbal understanding of the material. Pt agrees to go over the instructions while at home for a better understanding. The opportunity to ask questions was provided.

## 2018-09-02 MED ORDER — VANCOMYCIN HCL 10 G IV SOLR
1250.0000 mg | INTRAVENOUS | Status: AC
  Administered 2018-09-03: 1250 mg via INTRAVENOUS
  Filled 2018-09-02: qty 1250

## 2018-09-02 MED ORDER — MAGNESIUM SULFATE 50 % IJ SOLN
40.0000 meq | INTRAMUSCULAR | Status: DC
  Filled 2018-09-02: qty 9.85

## 2018-09-02 MED ORDER — NITROGLYCERIN IN D5W 200-5 MCG/ML-% IV SOLN
2.0000 ug/min | INTRAVENOUS | Status: DC
  Filled 2018-09-02: qty 250

## 2018-09-02 MED ORDER — INSULIN REGULAR(HUMAN) IN NACL 100-0.9 UT/100ML-% IV SOLN
INTRAVENOUS | Status: DC
  Filled 2018-09-02: qty 100

## 2018-09-02 MED ORDER — LEVOFLOXACIN IN D5W 500 MG/100ML IV SOLN
500.0000 mg | INTRAVENOUS | Status: AC
  Administered 2018-09-03: 500 mg via INTRAVENOUS
  Filled 2018-09-02: qty 100

## 2018-09-02 MED ORDER — NOREPINEPHRINE 4 MG/250ML-% IV SOLN
0.0000 ug/min | INTRAVENOUS | Status: DC
  Filled 2018-09-02: qty 250

## 2018-09-02 MED ORDER — EPINEPHRINE PF 1 MG/ML IJ SOLN
0.0000 ug/min | INTRAVENOUS | Status: DC
  Filled 2018-09-02: qty 4

## 2018-09-02 MED ORDER — POTASSIUM CHLORIDE 2 MEQ/ML IV SOLN
80.0000 meq | INTRAVENOUS | Status: DC
  Filled 2018-09-02: qty 40

## 2018-09-02 MED ORDER — PHENYLEPHRINE HCL-NACL 20-0.9 MG/250ML-% IV SOLN
30.0000 ug/min | INTRAVENOUS | Status: DC
  Filled 2018-09-02 (×2): qty 250

## 2018-09-02 MED ORDER — DEXMEDETOMIDINE HCL IN NACL 400 MCG/100ML IV SOLN
0.1000 ug/kg/h | INTRAVENOUS | Status: AC
  Administered 2018-09-03: 1 ug/kg/h via INTRAVENOUS
  Filled 2018-09-02: qty 100

## 2018-09-02 MED ORDER — SODIUM CHLORIDE 0.9 % IV SOLN
INTRAVENOUS | Status: DC
  Filled 2018-09-02: qty 30

## 2018-09-02 MED ORDER — DOPAMINE-DEXTROSE 3.2-5 MG/ML-% IV SOLN
0.0000 ug/kg/min | INTRAVENOUS | Status: DC
  Filled 2018-09-02: qty 250

## 2018-09-03 ENCOUNTER — Inpatient Hospital Stay (HOSPITAL_COMMUNITY): Payer: Medicare Other | Admitting: Physician Assistant

## 2018-09-03 ENCOUNTER — Inpatient Hospital Stay (HOSPITAL_COMMUNITY)
Admission: RE | Admit: 2018-09-03 | Discharge: 2018-09-04 | DRG: 267 | Disposition: A | Discharge status: Home / Self Care | Payer: Medicare Other | Source: Ambulatory Visit | Attending: Thoracic Surgery (Cardiothoracic Vascular Surgery) | Admitting: Thoracic Surgery (Cardiothoracic Vascular Surgery)

## 2018-09-03 ENCOUNTER — Inpatient Hospital Stay (HOSPITAL_COMMUNITY): Payer: Medicare Other

## 2018-09-03 ENCOUNTER — Encounter (HOSPITAL_COMMUNITY): Payer: Self-pay | Admitting: *Deleted

## 2018-09-03 ENCOUNTER — Encounter (HOSPITAL_COMMUNITY)
Admission: RE | Disposition: A | Discharge status: Home / Self Care | Payer: Self-pay | Source: Ambulatory Visit | Attending: Thoracic Surgery (Cardiothoracic Vascular Surgery)

## 2018-09-03 ENCOUNTER — Other Ambulatory Visit: Payer: Self-pay

## 2018-09-03 DIAGNOSIS — E669 Obesity, unspecified: Secondary | ICD-10-CM | POA: Diagnosis present

## 2018-09-03 DIAGNOSIS — I350 Nonrheumatic aortic (valve) stenosis: Secondary | ICD-10-CM | POA: Diagnosis not present

## 2018-09-03 DIAGNOSIS — Z853 Personal history of malignant neoplasm of breast: Secondary | ICD-10-CM | POA: Diagnosis not present

## 2018-09-03 DIAGNOSIS — Z882 Allergy status to sulfonamides status: Secondary | ICD-10-CM | POA: Diagnosis not present

## 2018-09-03 DIAGNOSIS — Z79899 Other long term (current) drug therapy: Secondary | ICD-10-CM | POA: Diagnosis not present

## 2018-09-03 DIAGNOSIS — Z6830 Body mass index (BMI) 30.0-30.9, adult: Secondary | ICD-10-CM

## 2018-09-03 DIAGNOSIS — Z880 Allergy status to penicillin: Secondary | ICD-10-CM | POA: Diagnosis not present

## 2018-09-03 DIAGNOSIS — I6529 Occlusion and stenosis of unspecified carotid artery: Secondary | ICD-10-CM | POA: Diagnosis not present

## 2018-09-03 DIAGNOSIS — K219 Gastro-esophageal reflux disease without esophagitis: Secondary | ICD-10-CM | POA: Diagnosis present

## 2018-09-03 DIAGNOSIS — E785 Hyperlipidemia, unspecified: Secondary | ICD-10-CM | POA: Diagnosis not present

## 2018-09-03 DIAGNOSIS — I10 Essential (primary) hypertension: Secondary | ICD-10-CM | POA: Diagnosis not present

## 2018-09-03 DIAGNOSIS — E890 Postprocedural hypothyroidism: Secondary | ICD-10-CM | POA: Diagnosis present

## 2018-09-03 DIAGNOSIS — Z952 Presence of prosthetic heart valve: Secondary | ICD-10-CM

## 2018-09-03 DIAGNOSIS — Z006 Encounter for examination for normal comparison and control in clinical research program: Secondary | ICD-10-CM

## 2018-09-03 DIAGNOSIS — E039 Hypothyroidism, unspecified: Secondary | ICD-10-CM | POA: Diagnosis present

## 2018-09-03 DIAGNOSIS — Z96653 Presence of artificial knee joint, bilateral: Secondary | ICD-10-CM | POA: Diagnosis present

## 2018-09-03 DIAGNOSIS — D1803 Hemangioma of intra-abdominal structures: Secondary | ICD-10-CM | POA: Diagnosis not present

## 2018-09-03 DIAGNOSIS — I351 Nonrheumatic aortic (valve) insufficiency: Secondary | ICD-10-CM | POA: Diagnosis not present

## 2018-09-03 DIAGNOSIS — E66811 Obesity, class 1: Secondary | ICD-10-CM | POA: Diagnosis present

## 2018-09-03 HISTORY — DX: Presence of prosthetic heart valve: Z95.2

## 2018-09-03 HISTORY — PX: INTRAOPERATIVE TRANSTHORACIC ECHOCARDIOGRAM: SHX6523

## 2018-09-03 HISTORY — PX: TRANSCATHETER AORTIC VALVE REPLACEMENT, TRANSFEMORAL: SHX6400

## 2018-09-03 LAB — POCT I-STAT, CHEM 8
BUN: 17 mg/dL (ref 8–23)
BUN: 18 mg/dL (ref 8–23)
BUN: 17 mg/dL (ref 8–23)
CHLORIDE: 99 mmol/L (ref 98–111)
CHLORIDE: 99 mmol/L (ref 98–111)
CREATININE: 0.6 mg/dL (ref 0.44–1.00)
Calcium, Ion: 1.14 mmol/L — ABNORMAL LOW (ref 1.15–1.40)
Calcium, Ion: 1.22 mmol/L (ref 1.15–1.40)
Calcium, Ion: 1.22 mmol/L (ref 1.15–1.40)
Chloride: 100 mmol/L (ref 98–111)
Creatinine, Ser: 0.6 mg/dL (ref 0.44–1.00)
Creatinine, Ser: 0.6 mg/dL (ref 0.44–1.00)
Glucose, Bld: 145 mg/dL — ABNORMAL HIGH (ref 70–99)
Glucose, Bld: 132 mg/dL — ABNORMAL HIGH (ref 70–99)
Glucose, Bld: 127 mg/dL — ABNORMAL HIGH (ref 70–99)
HCT: 31 % — ABNORMAL LOW (ref 36.0–46.0)
HCT: 32 % — ABNORMAL LOW (ref 36.0–46.0)
HEMATOCRIT: 30 % — AB (ref 36.0–46.0)
HEMOGLOBIN: 10.2 g/dL — AB (ref 12.0–15.0)
Hemoglobin: 10.5 g/dL — ABNORMAL LOW (ref 12.0–15.0)
Hemoglobin: 10.9 g/dL — ABNORMAL LOW (ref 12.0–15.0)
POTASSIUM: 3.5 mmol/L (ref 3.5–5.1)
POTASSIUM: 4 mmol/L (ref 3.5–5.1)
POTASSIUM: 4 mmol/L (ref 3.5–5.1)
SODIUM: 138 mmol/L (ref 135–145)
SODIUM: 138 mmol/L (ref 135–145)
Sodium: 138 mmol/L (ref 135–145)
TCO2: 26 mmol/L (ref 22–32)
TCO2: 30 mmol/L (ref 22–32)
TCO2: 30 mmol/L (ref 22–32)

## 2018-09-03 SURGERY — IMPLANTATION, AORTIC VALVE, TRANSCATHETER, FEMORAL APPROACH
Anesthesia: Monitor Anesthesia Care | Site: Chest

## 2018-09-03 MED ORDER — ONDANSETRON HCL 4 MG/2ML IJ SOLN
INTRAMUSCULAR | Status: DC | PRN
  Administered 2018-09-03: 4 mg via INTRAVENOUS

## 2018-09-03 MED ORDER — ACETAMINOPHEN 325 MG PO TABS: 650.0000 mg | ORAL_TABLET | Freq: Four times a day (QID) | ORAL | Status: DC | PRN

## 2018-09-03 MED ORDER — LIDOCAINE HCL 1 % IJ SOLN
INTRAMUSCULAR | Status: AC
  Filled 2018-09-03: qty 20

## 2018-09-03 MED ORDER — PROPOFOL 10 MG/ML IV BOLUS
INTRAVENOUS | Status: AC
  Filled 2018-09-03: qty 20

## 2018-09-03 MED ORDER — PHENYLEPHRINE HCL-NACL 20-0.9 MG/250ML-% IV SOLN
0.0000 ug/min | INTRAVENOUS | Status: DC
  Filled 2018-09-03: qty 250

## 2018-09-03 MED ORDER — NITROGLYCERIN IN D5W 200-5 MCG/ML-% IV SOLN: 0.0000 ug/min | INTRAVENOUS | Status: DC

## 2018-09-03 MED ORDER — PROTAMINE SULFATE 10 MG/ML IV SOLN
INTRAVENOUS | Status: DC | PRN
  Administered 2018-09-03: 10 mg via INTRAVENOUS
  Administered 2018-09-03: 110 mg via INTRAVENOUS

## 2018-09-03 MED ORDER — EPHEDRINE 5 MG/ML INJ
INTRAVENOUS | Status: AC
  Filled 2018-09-03: qty 10

## 2018-09-03 MED ORDER — SUCCINYLCHOLINE CHLORIDE 200 MG/10ML IV SOSY
PREFILLED_SYRINGE | INTRAVENOUS | Status: AC
  Filled 2018-09-03: qty 10

## 2018-09-03 MED ORDER — SODIUM CHLORIDE 0.9% FLUSH
3.0000 mL | Freq: Two times a day (BID) | INTRAVENOUS | Status: DC
  Administered 2018-09-04: 3 mL via INTRAVENOUS

## 2018-09-03 MED ORDER — CHLORHEXIDINE GLUCONATE 4 % EX LIQD: 60.0000 mL | Freq: Once | CUTANEOUS | Status: DC

## 2018-09-03 MED ORDER — HEPARIN SODIUM (PORCINE) 1000 UNIT/ML IJ SOLN
INTRAMUSCULAR | Status: DC | PRN
  Administered 2018-09-03: 12000 [IU] via INTRAVENOUS

## 2018-09-03 MED ORDER — HYPROMELLOSE (GONIOSCOPIC) 2.5 % OP SOLN
1.0000 [drp] | Freq: Every evening | OPHTHALMIC | Status: DC
  Filled 2018-09-03: qty 15

## 2018-09-03 MED ORDER — SODIUM CHLORIDE 0.9 % IV SOLN
INTRAVENOUS | Status: DC | PRN
  Administered 2018-09-03: 1500 mL

## 2018-09-03 MED ORDER — ACETAMINOPHEN 650 MG RE SUPP: 650.0000 mg | Freq: Four times a day (QID) | RECTAL | Status: DC | PRN

## 2018-09-03 MED ORDER — PROPOFOL 10 MG/ML IV BOLUS: INTRAVENOUS | Status: DC | PRN

## 2018-09-03 MED ORDER — LACTATED RINGERS IV SOLN
INTRAVENOUS | Status: DC | PRN
  Administered 2018-09-03: 07:00:00 via INTRAVENOUS

## 2018-09-03 MED ORDER — PROPOFOL 10 MG/ML IV BOLUS
INTRAVENOUS | Status: DC | PRN
  Administered 2018-09-03: 20 mg via INTRAVENOUS
  Administered 2018-09-03: 10 mg via INTRAVENOUS

## 2018-09-03 MED ORDER — FENTANYL CITRATE (PF) 100 MCG/2ML IJ SOLN
INTRAMUSCULAR | Status: DC | PRN
  Administered 2018-09-03: 100 ug via INTRAVENOUS

## 2018-09-03 MED ORDER — DEXAMETHASONE SODIUM PHOSPHATE 10 MG/ML IJ SOLN
INTRAMUSCULAR | Status: AC
  Filled 2018-09-03: qty 1

## 2018-09-03 MED ORDER — LEVOFLOXACIN IN D5W 750 MG/150ML IV SOLN
750.0000 mg | INTRAVENOUS | Status: AC
  Administered 2018-09-04: 750 mg via INTRAVENOUS
  Filled 2018-09-03: qty 150

## 2018-09-03 MED ORDER — SODIUM CHLORIDE 0.9 % IV SOLN: INTRAVENOUS | Status: DC

## 2018-09-03 MED ORDER — FENTANYL CITRATE (PF) 250 MCG/5ML IJ SOLN
INTRAMUSCULAR | Status: AC
  Filled 2018-09-03: qty 5

## 2018-09-03 MED ORDER — ONDANSETRON HCL 4 MG/2ML IJ SOLN
INTRAMUSCULAR | Status: AC
  Filled 2018-09-03: qty 2

## 2018-09-03 MED ORDER — ROCURONIUM BROMIDE 50 MG/5ML IV SOSY
PREFILLED_SYRINGE | INTRAVENOUS | Status: AC
  Filled 2018-09-03: qty 5

## 2018-09-03 MED ORDER — VANCOMYCIN HCL IN DEXTROSE 1-5 GM/200ML-% IV SOLN
1000.0000 mg | Freq: Once | INTRAVENOUS | Status: AC
  Administered 2018-09-03: 1000 mg via INTRAVENOUS
  Filled 2018-09-03: qty 200

## 2018-09-03 MED ORDER — SODIUM CHLORIDE 0.9% FLUSH: 3.0000 mL | INTRAVENOUS | Status: DC | PRN

## 2018-09-03 MED ORDER — OXYCODONE HCL 5 MG PO TABS: 5.0000 mg | ORAL_TABLET | ORAL | Status: DC | PRN

## 2018-09-03 MED ORDER — LEVOTHYROXINE SODIUM 100 MCG PO TABS
100.0000 ug | ORAL_TABLET | Freq: Every day | ORAL | Status: DC
  Administered 2018-09-04: 100 ug via ORAL
  Filled 2018-09-03: qty 1

## 2018-09-03 MED ORDER — SOOTHE XP OP SOLN: 1.0000 [drp] | Freq: Every evening | OPHTHALMIC | Status: DC

## 2018-09-03 MED ORDER — PROPOFOL 500 MG/50ML IV EMUL
INTRAVENOUS | Status: DC | PRN
  Administered 2018-09-03: 10 ug/kg/min via INTRAVENOUS

## 2018-09-03 MED ORDER — PRAVASTATIN SODIUM 40 MG PO TABS
40.0000 mg | ORAL_TABLET | Freq: Every evening | ORAL | Status: DC
  Administered 2018-09-03: 40 mg via ORAL
  Filled 2018-09-03: qty 1

## 2018-09-03 MED ORDER — IODIXANOL 320 MG/ML IV SOLN
INTRAVENOUS | Status: DC | PRN
  Administered 2018-09-03: 70.4 mL via INTRA_ARTERIAL

## 2018-09-03 MED ORDER — MORPHINE SULFATE (PF) 2 MG/ML IV SOLN: 2.0000 mg | INTRAVENOUS | Status: DC | PRN

## 2018-09-03 MED ORDER — LIDOCAINE HCL (PF) 1 % IJ SOLN
INTRAMUSCULAR | Status: DC | PRN
  Administered 2018-09-03: 15 mL

## 2018-09-03 MED ORDER — SODIUM CHLORIDE 0.9 % IV SOLN
INTRAVENOUS | Status: AC
  Filled 2018-09-03 (×3): qty 1.2

## 2018-09-03 MED ORDER — MIDAZOLAM HCL 2 MG/2ML IJ SOLN
INTRAMUSCULAR | Status: AC
  Filled 2018-09-03: qty 2

## 2018-09-03 MED ORDER — CHLORHEXIDINE GLUCONATE 0.12 % MT SOLN
15.0000 mL | Freq: Once | OROMUCOSAL | Status: AC
  Administered 2018-09-03: 15 mL via OROMUCOSAL
  Filled 2018-09-03: qty 15

## 2018-09-03 MED ORDER — CLOPIDOGREL BISULFATE 75 MG PO TABS
75.0000 mg | ORAL_TABLET | Freq: Every day | ORAL | Status: DC
  Administered 2018-09-04: 75 mg via ORAL
  Filled 2018-09-03: qty 1

## 2018-09-03 MED ORDER — CHLORHEXIDINE GLUCONATE 4 % EX LIQD: 30.0000 mL | CUTANEOUS | Status: DC

## 2018-09-03 MED ORDER — TRAMADOL HCL 50 MG PO TABS: 50.0000 mg | ORAL_TABLET | ORAL | Status: DC | PRN

## 2018-09-03 MED ORDER — ESMOLOL HCL 100 MG/10ML IV SOLN
INTRAVENOUS | Status: AC
  Filled 2018-09-03: qty 10

## 2018-09-03 MED ORDER — POLYVINYL ALCOHOL 1.4 % OP SOLN
1.0000 [drp] | Freq: Every evening | OPHTHALMIC | Status: DC
  Filled 2018-09-03: qty 15

## 2018-09-03 MED ORDER — SODIUM CHLORIDE 0.9 % IV SOLN
INTRAVENOUS | Status: AC
  Administered 2018-09-03: 50 mL/h via INTRAVENOUS

## 2018-09-03 MED ORDER — METOPROLOL TARTRATE 5 MG/5ML IV SOLN: 2.5000 mg | INTRAVENOUS | Status: DC | PRN

## 2018-09-03 MED ORDER — ASPIRIN 81 MG PO CHEW
81.0000 mg | CHEWABLE_TABLET | Freq: Every day | ORAL | Status: DC
  Administered 2018-09-04: 81 mg via ORAL
  Filled 2018-09-03: qty 1

## 2018-09-03 MED ORDER — PANTOPRAZOLE SODIUM 40 MG PO TBEC
40.0000 mg | DELAYED_RELEASE_TABLET | Freq: Every day | ORAL | Status: DC
  Administered 2018-09-04: 40 mg via ORAL
  Filled 2018-09-03 (×2): qty 1

## 2018-09-03 MED ORDER — 0.9 % SODIUM CHLORIDE (POUR BTL) OPTIME
TOPICAL | Status: DC | PRN
  Administered 2018-09-03: 1000 mL

## 2018-09-03 MED ORDER — SODIUM CHLORIDE 0.9 % IV SOLN: 250.0000 mL | INTRAVENOUS | Status: DC | PRN

## 2018-09-03 MED ORDER — LIDOCAINE 2% (20 MG/ML) 5 ML SYRINGE
INTRAMUSCULAR | Status: AC
  Filled 2018-09-03: qty 5

## 2018-09-03 MED ORDER — PHENYLEPHRINE 40 MCG/ML (10ML) SYRINGE FOR IV PUSH (FOR BLOOD PRESSURE SUPPORT)
PREFILLED_SYRINGE | INTRAVENOUS | Status: AC
  Filled 2018-09-03: qty 10

## 2018-09-03 MED ORDER — ONDANSETRON HCL 4 MG/2ML IJ SOLN: 4.0000 mg | Freq: Four times a day (QID) | INTRAMUSCULAR | Status: DC | PRN

## 2018-09-03 SURGICAL SUPPLY — 87 items
BAG DECANTER FOR FLEXI CONT (MISCELLANEOUS) ×4 IMPLANT
BAG SNAP BAND KOVER 36X36 (MISCELLANEOUS) ×4 IMPLANT
BLADE CLIPPER SURG (BLADE) ×4 IMPLANT
BLADE OSCILLATING /SAGITTAL (BLADE) IMPLANT
BLADE STERNUM SYSTEM 6 (BLADE) ×4 IMPLANT
CABLE ADAPT CONN TEMP 6FT (ADAPTER) ×4 IMPLANT
CANNULA FEM VENOUS REMOTE 22FR (CANNULA) IMPLANT
CANNULA OPTISITE PERFUSION 16F (CANNULA) IMPLANT
CANNULA OPTISITE PERFUSION 18F (CANNULA) IMPLANT
CATH DIAG EXPO 6F VENT PIG 145 (CATHETERS) ×8 IMPLANT
CATH EXPO 5FR AL1 (CATHETERS) IMPLANT
CATH INFINITI 6F AL2 (CATHETERS) IMPLANT
CATH S G BIP PACING (SET/KITS/TRAYS/PACK) ×4 IMPLANT
CLIP VESOCCLUDE MED 24/CT (CLIP) IMPLANT
CLIP VESOCCLUDE SM WIDE 24/CT (CLIP) IMPLANT
CONT SPEC 4OZ CLIKSEAL STRL BL (MISCELLANEOUS) ×12 IMPLANT
COVER BACK TABLE 24X17X13 BIG (DRAPES) IMPLANT
COVER BACK TABLE 80X110 HD (DRAPES) ×4 IMPLANT
COVER DOME SNAP 22 D (MISCELLANEOUS) IMPLANT
COVER WAND RF STERILE (DRAPES) ×4 IMPLANT
CRADLE DONUT ADULT HEAD (MISCELLANEOUS) ×4 IMPLANT
DERMABOND ADVANCED (GAUZE/BANDAGES/DRESSINGS) ×2
DERMABOND ADVANCED .7 DNX12 (GAUZE/BANDAGES/DRESSINGS) ×2 IMPLANT
DEVICE CLOSURE PERCLS PRGLD 6F (VASCULAR PRODUCTS) ×4 IMPLANT
DRAPE INCISE IOBAN 66X45 STRL (DRAPES) IMPLANT
DRSG TEGADERM 4X4.75 (GAUZE/BANDAGES/DRESSINGS) ×8 IMPLANT
ELECT CAUTERY BLADE 6.4 (BLADE) IMPLANT
ELECT REM PT RETURN 9FT ADLT (ELECTROSURGICAL) ×8
ELECTRODE REM PT RTRN 9FT ADLT (ELECTROSURGICAL) ×4 IMPLANT
FELT TEFLON 6X6 (MISCELLANEOUS) IMPLANT
FEMORAL VENOUS CANN RAP (CANNULA) IMPLANT
GAUZE SPONGE 4X4 12PLY STRL (GAUZE/BANDAGES/DRESSINGS) ×4 IMPLANT
GAUZE SPONGE 4X4 12PLY STRL LF (GAUZE/BANDAGES/DRESSINGS) ×4 IMPLANT
GLOVE BIO SURGEON STRL SZ7.5 (GLOVE) ×4 IMPLANT
GLOVE BIO SURGEON STRL SZ8 (GLOVE) IMPLANT
GLOVE EUDERMIC 7 POWDERFREE (GLOVE) IMPLANT
GLOVE ORTHO TXT STRL SZ7.5 (GLOVE) ×4 IMPLANT
GOWN STRL REUS W/ TWL LRG LVL3 (GOWN DISPOSABLE) ×6 IMPLANT
GOWN STRL REUS W/ TWL XL LVL3 (GOWN DISPOSABLE) ×6 IMPLANT
GOWN STRL REUS W/TWL LRG LVL3 (GOWN DISPOSABLE) ×6
GOWN STRL REUS W/TWL XL LVL3 (GOWN DISPOSABLE) ×6
GUIDEWIRE SAFE TJ AMPLATZ EXST (WIRE) ×4 IMPLANT
GUIDEWIRE STRAIGHT .035 260CM (WIRE) ×4 IMPLANT
INSERT FOGARTY SM (MISCELLANEOUS) IMPLANT
KIT BASIN OR (CUSTOM PROCEDURE TRAY) ×4 IMPLANT
KIT DILATOR VASC 18G NDL (KITS) IMPLANT
KIT HEART LEFT (KITS) ×4 IMPLANT
KIT SUCTION CATH 14FR (SUCTIONS) IMPLANT
KIT TURNOVER KIT B (KITS) ×4 IMPLANT
LOOP VESSEL MAXI BLUE (MISCELLANEOUS) IMPLANT
LOOP VESSEL MINI RED (MISCELLANEOUS) IMPLANT
NEEDLE 22X1 1/2 (OR ONLY) (NEEDLE) ×4 IMPLANT
NEEDLE PERC 18GX7CM (NEEDLE) ×4 IMPLANT
NS IRRIG 1000ML POUR BTL (IV SOLUTION) ×4 IMPLANT
PACK ENDOVASCULAR (PACKS) ×4 IMPLANT
PAD ARMBOARD 7.5X6 YLW CONV (MISCELLANEOUS) ×8 IMPLANT
PAD ELECT DEFIB RADIOL ZOLL (MISCELLANEOUS) ×4 IMPLANT
PENCIL BUTTON HOLSTER BLD 10FT (ELECTRODE) IMPLANT
PERCLOSE PROGLIDE 6F (VASCULAR PRODUCTS) ×8
SET MICROPUNCTURE 5F STIFF (MISCELLANEOUS) ×4 IMPLANT
SHEATH BRITE TIP 6FR 35CM (SHEATH) ×4 IMPLANT
SHEATH PINNACLE 6F 10CM (SHEATH) ×4 IMPLANT
SHEATH PINNACLE 8F 10CM (SHEATH) ×4 IMPLANT
SLEEVE REPOSITIONING LENGTH 30 (MISCELLANEOUS) ×4 IMPLANT
SPONGE LAP 4X18 RFD (DISPOSABLE) IMPLANT
STOPCOCK MORSE 400PSI 3WAY (MISCELLANEOUS) ×8 IMPLANT
SUT ETHIBOND X763 2 0 SH 1 (SUTURE) IMPLANT
SUT GORETEX CV 4 TH 22 36 (SUTURE) IMPLANT
SUT GORETEX CV4 TH-18 (SUTURE) IMPLANT
SUT MNCRL AB 3-0 PS2 18 (SUTURE) IMPLANT
SUT PROLENE 5 0 C 1 36 (SUTURE) IMPLANT
SUT PROLENE 6 0 C 1 30 (SUTURE) IMPLANT
SUT SILK  1 MH (SUTURE) ×2
SUT SILK 1 MH (SUTURE) ×2 IMPLANT
SUT VIC AB 2-0 CT1 27 (SUTURE)
SUT VIC AB 2-0 CT1 TAPERPNT 27 (SUTURE) IMPLANT
SUT VIC AB 2-0 CTX 36 (SUTURE) IMPLANT
SUT VIC AB 3-0 SH 8-18 (SUTURE) IMPLANT
SYR 50ML LL SCALE MARK (SYRINGE) ×4 IMPLANT
SYR BULB IRRIGATION 50ML (SYRINGE) IMPLANT
SYR CONTROL 10ML LL (SYRINGE) IMPLANT
TAPE CLOTH SURG 4X10 WHT LF (GAUZE/BANDAGES/DRESSINGS) ×4 IMPLANT
TOWEL GREEN STERILE (TOWEL DISPOSABLE) ×8 IMPLANT
TRANSDUCER W/STOPCOCK (MISCELLANEOUS) ×8 IMPLANT
TRAY FOLEY SLVR 16FR TEMP STAT (SET/KITS/TRAYS/PACK) IMPLANT
VALVE HEART TRANSCATH SZ3 23MM (Valve) ×4 IMPLANT
WIRE .035 3MM-J 145CM (WIRE) ×4 IMPLANT

## 2018-09-03 NOTE — Anesthesia Preprocedure Evaluation (Addendum)
Anesthesia Evaluation  Patient identified by MRN, date of birth, ID band Patient awake    Reviewed: Allergy & Precautions, NPO status , Patient's Chart, lab work & pertinent test results  Airway Mallampati: II  TM Distance: >3 FB Neck ROM: Full    Dental  (+) Chipped,    Pulmonary neg pulmonary ROS,    breath sounds clear to auscultation       Cardiovascular hypertension,  Rhythm:Regular Rate:Normal + Systolic murmurs History noted. CG   Neuro/Psych    GI/Hepatic Neg liver ROS, GERD  ,  Endo/Other  Hypothyroidism   Renal/GU negative Renal ROS     Musculoskeletal   Abdominal   Peds  Hematology   Anesthesia Other Findings   Reproductive/Obstetrics                            Anesthesia Physical Anesthesia Plan  ASA: III  Anesthesia Plan: MAC   Post-op Pain Management:    Induction: Intravenous  PONV Risk Score and Plan: Treatment may vary due to age or medical condition, Ondansetron and Midazolam  Airway Management Planned: Simple Face Mask  Additional Equipment: Arterial line, CVP and Ultrasound Guidance Line Placement  Intra-op Plan:   Post-operative Plan:   Informed Consent: I have reviewed the patients History and Physical, chart, labs and discussed the procedure including the risks, benefits and alternatives for the proposed anesthesia with the patient or authorized representative who has indicated his/her understanding and acceptance.   Dental advisory given  Plan Discussed with: CRNA, Anesthesiologist and Surgeon  Anesthesia Plan Comments:         Anesthesia Quick Evaluation

## 2018-09-03 NOTE — Progress Notes (Signed)
  [facility] VALVE TEAM  Patient doing well s/p TAVR. She is hemodynamically stable. Groin sites stable. ECG with sinus and 1st deg AV block but no high grade block. Arterial line discontinued and transferred  to 4E. Plan for early ambulation after bedrest completed and hopeful discharge over the next 24-48 hours.   Angelena Form PA-C  [facility]  Pager [phone]

## 2018-09-03 NOTE — Progress Notes (Signed)
Patient ambulated in the hallway approximately 500 feet without difficulty.  Will continue to monitor.

## 2018-09-03 NOTE — Anesthesia Procedure Notes (Addendum)
Central Venous Catheter Insertion Performed by: Belinda Block, MD, anesthesiologist Start/End11/09/2018 6:45 AM, 09/03/2018 7:05 AM Preanesthetic checklist: patient identified, IV checked, site marked, risks and benefits discussed, surgical consent, monitors and equipment checked, pre-op evaluation and timeout performed Position: Trendelenburg Lidocaine 1% used for infiltration and patient sedated Hand hygiene performed , maximum sterile barriers used  and Seldinger technique used Central line was placed.Double lumen Procedure performed using ultrasound guided technique. Ultrasound Notes:anatomy identified Attempts: 1 Following insertion, line sutured, dressing applied and Biopatch. Post procedure assessment: blood return through all ports  Patient tolerated the procedure well with no immediate complications.

## 2018-09-03 NOTE — Interval H&P Note (Signed)
History and Physical Interval Note:  09/03/2018 6:26 AM  Zoe [name]  has presented today for surgery, with the diagnosis of Severe Aortic Stenosis  The various methods of treatment have been discussed with the patient and family. After consideration of risks, benefits and other options for treatment, the patient has consented to  Procedure(s): TRANSCATHETER AORTIC VALVE REPLACEMENT, TRANSFEMORAL (N/A) TRANSESOPHAGEAL ECHOCARDIOGRAM (TEE) (N/A) as a surgical intervention .  The patient's history has been reviewed, patient examined, no change in status, stable for surgery.  I have reviewed the patient's chart and labs.  Questions were answered to the patient's satisfaction.     Rexene Alberts

## 2018-09-03 NOTE — Anesthesia Procedure Notes (Signed)
Arterial Line Insertion Start/End11/09/2018 6:50 AM, 09/03/2018 7:10 AM Performed by: Leonor Liv, CRNA, CRNA  Patient location: Pre-op. Preanesthetic checklist: patient identified, IV checked, site marked, risks and benefits discussed, surgical consent, monitors and equipment checked, pre-op evaluation, timeout performed and anesthesia consent Lidocaine 1% used for infiltration and patient sedated Left, radial was placed Catheter size: 20 G Hand hygiene performed  and maximum sterile barriers used  Allen's test indicative of satisfactory collateral circulation Attempts: 1 Procedure performed without using ultrasound guided technique. Following insertion, Biopatch and dressing applied. Post procedure assessment: normal  Patient tolerated the procedure well with no immediate complications. Additional procedure comments: Arterial line inserted under direct supervision by Bertram Savin .

## 2018-09-03 NOTE — Transfer of Care (Signed)
Immediate Anesthesia Transfer of Care Note  Patient: [name]  Procedure(s) Performed: TRANSCATHETER AORTIC VALVE REPLACEMENT, TRANSFEMORAL (N/A Chest) TRANSESOPHAGEAL ECHOCARDIOGRAM (TEE) (N/A )  Patient Location: Cath Lab  Anesthesia Type:MAC  Level of Consciousness: awake, alert  and oriented  Airway & Oxygen Therapy: Patient Spontanous Breathing and Patient connected to nasal cannula oxygen  Post-op Assessment: Report given to RN, Post -op Vital signs reviewed and stable and Patient moving all extremities  Post vital signs: Reviewed and stable  Last Vitals:  Vitals Value Taken Time  BP 114/85 09/03/2018  9:54 AM  Temp    Pulse 72 09/03/2018  9:55 AM  Resp 19 09/03/2018  9:55 AM  SpO2 90 % 09/03/2018  9:55 AM  Vitals shown include unvalidated device data.  Last Pain:  Vitals:   09/03/18 0940  TempSrc: Temporal  PainSc:          Complications: No apparent anesthesia complications

## 2018-09-03 NOTE — Progress Notes (Signed)
Patient ambulated to the bathroom and back with very minimal assistance.  Patient tolerated ambulation well.  Will continue to monitor.

## 2018-09-03 NOTE — Anesthesia Postprocedure Evaluation (Signed)
Anesthesia Post Note  Patient: Zoe [name]  Procedure(s) Performed: TRANSCATHETER AORTIC VALVE REPLACEMENT, TRANSFEMORAL (N/A Chest) INTRAOPERATIVE TRANSTHORACIC ECHOCARDIOGRAM (N/A )     Patient location during evaluation: PACU Anesthesia Type: MAC Level of consciousness: awake Pain management: pain level controlled Vital Signs Assessment: post-procedure vital signs reviewed and stable Respiratory status: spontaneous breathing Cardiovascular status: stable Postop Assessment: no apparent nausea or vomiting Anesthetic complications: no    Last Vitals:  Vitals:   09/03/18 1117 09/03/18 1135  BP: (!) 151/63 (!) 125/58  Pulse: 72   Resp: 16   Temp: 36.4 C   SpO2: 94%     Last Pain:  Vitals:   09/03/18 1200  TempSrc:   PainSc: 0-No pain                 Tajuanna Burnett

## 2018-09-03 NOTE — Progress Notes (Signed)
Patient arrived to 4E room 7 post TAVR with Dr. Angelena Form.  CHG bath done.  Telemetry monitor applied and CCMD notified.  Patient oriented to unit and room to include call light and phone.  Will continue to monitor.

## 2018-09-03 NOTE — CV Procedure (Signed)
[facility]  TAVR OPERATIVE NOTE   Date of Procedure:  09/03/2018  Preoperative Diagnosis: Severe Aortic Stenosis   Postoperative Diagnosis: Same   Procedure:    Transcatheter Aortic Valve Replacement - Transfemoral Approach  Edwards Sapien 3 THV (size 23 mm, model # [id number], serial # [id number])   Co-Surgeons:  Lauree Chandler, MD and Valentina Gu. Roxy Manns, MD   Anesthesiologist:  Nyoka Cowden  Echocardiographer:  Croitoru  Pre-operative Echo Findings:  Severe aortic stenosis  Normal left ventricular systolic function  Post-operative Echo Findings:  No paravalvular leak  Normal left ventricular systolic function  BRIEF CLINICAL NOTE AND INDICATIONS FOR SURGERY  84 yo female with history of breast cancer, GERD, nephrolithiasis, HTN, hyperlipidemia, hypothyroidism, carotid artery disease, and severe aortic stenosis who is here today for TAVR. She has been followed by Dr. Einar Gip for aortic stenosis for several years. Most recent echo 07/02/18 in Dr. Irven Shelling office showed moderate LVH with normal LV systolic function. LVEF=65%. Mild dilation of the left atrium. The aortic valve is thickened and calcified. Opening of the valve leaflets is restricted. Mean gradient 46 mmHg, peak gradient 69 mmHg, AVA 0.65cm2, mild mitral regurgitation. Cardiac cath in June 2012 with moderate disease in the ostium of the ramus intermediate branch which was felt to be due to spasm. The other vessels had no disease. She had breast cancer 20 years ago and had a lumpectomy. Cardiac cath 08/06/18 with no obstructive CAD.   During the course of the patient's preoperative work up they have been evaluated comprehensively by a multidisciplinary team of specialists coordinated through the [facility] in the [facility].  They have been demonstrated to suffer from symptomatic severe aortic stenosis as noted above. The patient has been counseled extensively  as to the relative risks and benefits of all options for the treatment of severe aortic stenosis including long term medical therapy, conventional surgery for aortic valve replacement, and transcatheter aortic valve replacement.  The patient has been independently evaluated by Dr. Roxy Manns from [facility] and she is felt to be at high risk for conventional surgical aortic valve replacement. Both surgeons indicated the patient would be a poor candidate for conventional surgery. Based upon review of all of the patient's preoperative diagnostic tests they are felt to be candidate for transcatheter aortic valve replacement using the transfemoral approach as an alternative to high risk conventional surgery.    Following the decision to proceed with transcatheter aortic valve replacement, a discussion has been held regarding what types of management strategies would be attempted intraoperatively in the event of life-threatening complications, including whether or not the patient would be considered a candidate for the use of cardiopulmonary bypass and/or conversion to open sternotomy for attempted surgical intervention.  The patient has been advised of a variety of complications that might develop peculiar to this approach including but not limited to risks of death, stroke, paravalvular leak, aortic dissection or other major vascular complications, aortic annulus rupture, device embolization, cardiac rupture or perforation, acute myocardial infarction, arrhythmia, heart block or bradycardia requiring permanent pacemaker placement, congestive heart failure, respiratory failure, renal failure, pneumonia, infection, other late complications related to structural valve deterioration or migration, or other complications that might ultimately cause a temporary or permanent loss of functional independence or other long term morbidity.  The patient provides full informed consent for the procedure as described and all questions  were answered preoperatively.    DETAILS OF THE OPERATIVE PROCEDURE  PREPARATION:  The patient is brought to the operating room on the above mentioned date and central monitoring was established by the anesthesia team including placement of a radial arterial line. The patient is placed in the supine position on the operating table.  Intravenous antibiotics are administered. Conscious sedation is used.   Baseline transthoracic echocardiogram was performed. The patient's chest, abdomen, both groins, and both lower extremities are prepared and draped in a sterile manner. A time out procedure is performed.   PERIPHERAL ACCESS:   Using the modified Seldinger technique, femoral arterial and venous access were obtained with placement of 6 Fr sheaths on the left side using u/s guidance.  A pigtail diagnostic catheter was passed through the femoral arterial sheath under fluoroscopic guidance into the aortic root.  A temporary transvenous pacemaker catheter was passed through the femoral venous sheath under fluoroscopic guidance into the right ventricle.  The pacemaker was tested to ensure stable lead placement and pacemaker capture. Aortic root angiography was performed in order to determine the optimal angiographic angle for valve deployment.  TRANSFEMORAL ACCESS:  A micropuncture kit was used to gain access to the right femoral artery. Position confirmed with angiography. Pre-closure with double ProGlide closure devices. The patient was heparinized systemically and ACT verified > 250 seconds.    A 14 Fr transfemoral E-sheath was introduced into the right femoral artery after progressively dilating over an Amplatz superstiff wire. An AL-1 catheter was used to direct a straight-tip exchange length wire across the native aortic valve into the left ventricle. This was exchanged out for a pigtail catheter and position was confirmed in the LV apex. Simultaneous LV and Ao pressures were recorded.  The pigtail  catheter was then exchanged for an Amplatz Extra-stiff wire in the LV apex.   TRANSCATHETER HEART VALVE DEPLOYMENT:  An Edwards Sapien 3 THV (size 23 mm) was prepared and crimped per manufacturer's guidelines, and the proper orientation of the valve is confirmed on the [employer] delivery system. The valve was advanced through the introducer sheath using normal technique until in an appropriate position in the abdominal aorta beyond the sheath tip. The balloon was then retracted and using the fine-tuning wheel was centered on the valve. The valve was then advanced across the aortic arch using appropriate flexion of the catheter. The valve was carefully positioned across the aortic valve annulus. The Commander catheter was retracted using normal technique. Once final position of the valve has been confirmed by angiographic assessment, the valve is deployed while temporarily holding ventilation and during rapid ventricular pacing to maintain systolic blood pressure < 50 mmHg and pulse pressure < 10 mmHg. The balloon inflation is held for >3 seconds after reaching full deployment volume. Once the balloon has fully deflated the balloon is retracted into the ascending aorta and valve function is assessed using TTE. There is felt to be no paravalvular leak and no central aortic insufficiency.  The patient's hemodynamic recovery following valve deployment is good.  The deployment balloon and guidewire are both removed. Echo demostrated acceptable post-procedural gradients, stable mitral valve function, and no AI.   PROCEDURE COMPLETION:  The sheath was then removed and closure devices were completed. Protamine was administered once femoral arterial repair was complete. The temporary pacemaker, pigtail catheters and femoral sheaths were removed with manual pressure used for hemostasis.   The patient tolerated the procedure well and is transported to the surgical intensive care in stable condition. There were  no immediate intraoperative complications. All sponge instrument and needle counts  are verified correct at completion of the operation.   No blood products were administered during the operation.  The patient received a total of 70.4 mL of intravenous contrast during the procedure.  Lauree Chandler MD 09/03/2018 9:32 AM

## 2018-09-03 NOTE — Progress Notes (Signed)
Echocardiogram 2D Echocardiogram has been performed.  Zoe [name] 09/03/2018, 9:20 AM

## 2018-09-03 NOTE — Progress Notes (Signed)
  Echocardiogram 2D Echocardiogram has been performed.  Zoe [name] 09/03/2018, 2:10 PM

## 2018-09-03 NOTE — Op Note (Signed)
[facility]   MULTIDISCIPLINARY HEART VALVE TEAM   TAVR OPERATIVE NOTE   Date of Procedure:  09/03/2018  Preoperative Diagnosis: Severe Aortic Stenosis   Postoperative Diagnosis: Same   Procedure:    Transcatheter Aortic Valve Replacement - Percutaneous Right Transfemoral Approach  Edwards Sapien 3 THV (size 23 mm, model # 9600TFX, serial # [id number])   Co-Surgeons:  Valentina Gu. Roxy Manns, MD and Lauree Chandler, MD  Anesthesiologist:  Belinda Block, MD  Echocardiographer:  Sanda Klein, MD  Pre-operative Echo Findings:  Severe aortic stenosis  Normal left ventricular systolic function  Post-operative Echo Findings:  No paravalvular leak  Normal left ventricular systolic function   BRIEF CLINICAL NOTE AND INDICATIONS FOR SURGERY  Patient is an 84 year old female with history of aortic stenosis, hypertension, and hypercholesterolemia who has been referred for surgical consultation to discuss treatment options for management of severe symptomatic aortic stenosis.    Patient states that she was first noted to have a heart murmur on physical exam by her primary care physician several years ago.  Echocardiogram revealed the presence and she was referred to Dr. Einar Gip who has been following her carefully ever since.  Recent follow-up echocardiogram reportedly demonstrated the presence of severe aortic stenosis with normal left ventricular systolic function.  Patient has developed exertional shortness of breath and fatigue.  She subsequently underwent diagnostic cardiac catheterization which revealed normal coronary arteries and no significant coronary artery disease.  She had moderate pulmonary hypertension.  Transvalvular gradient across the aortic valve was not assessed.  Patient was referred to the multidisciplinary heart valve clinic and is been evaluated previously by Dr. Angelena Form.  Transcatheter aortic valve replacement was discussed as an alternative to  conventional surgery.  CT angiography was performed and the patient referred for surgical consultation.  During the course of the patient's preoperative work up they have been evaluated comprehensively by a multidisciplinary team of specialists coordinated through the [facility] in the [place] [facility].  They have been demonstrated to suffer from symptomatic severe aortic stenosis as noted above. The patient has been counseled extensively as to the relative risks and benefits of all options for the treatment of severe aortic stenosis including long term medical therapy, conventional surgery for aortic valve replacement, and transcatheter aortic valve replacement.  All questions have been answered, and the patient provides full informed consent for the operation as described.   DETAILS OF THE OPERATIVE PROCEDURE  PREPARATION:    The patient is brought to the operating room on the above mentioned date and central monitoring was established by the anesthesia team including placement of a central venous line and radial arterial line. The patient is placed in the supine position on the operating table.  Intravenous antibiotics are administered. The patient is monitored closely throughout the procedure under conscious sedation.  Baseline transthoracic echocardiogram was performed. The patient's chest, abdomen, both groins, and both lower extremities are prepared and draped in a sterile manner. A time out procedure is performed.   PERIPHERAL ACCESS:    Using the modified Seldinger technique, femoral arterial and venous access was obtained with placement of 6 Fr sheaths on the left side.  A pigtail diagnostic catheter was passed through the left arterial sheath under fluoroscopic guidance into the aortic root.  A temporary transvenous pacemaker catheter was passed through the left femoral venous sheath under fluoroscopic guidance into the right ventricle.  The  pacemaker was tested to ensure stable lead placement and pacemaker  capture. Aortic root angiography was performed in order to determine the optimal angiographic angle for valve deployment.   TRANSFEMORAL ACCESS:   Percutaneous transfemoral access and sheath placement was performed using ultrasound guidance.  The right common femoral artery was cannulated using a micropuncture needle and appropriate location was verified using hand injection angiogram.  A pair of Abbott Perclose percutaneous closure devices were placed and a 6 French sheath replaced into the femoral artery.  The patient was heparinized systemically and ACT verified > 250 seconds.    A 14 Fr transfemoral E-sheath was introduced into the right common femoral artery after progressively dilating over an Amplatz superstiff wire. An AL-1 catheter was used to direct a straight-tip exchange length wire across the native aortic valve into the left ventricle. This was exchanged out for a pigtail catheter and position was confirmed in the LV apex. Simultaneous LV and Ao pressures were recorded.  The pigtail catheter was exchanged for an Amplatz Extra-stiff wire in the LV apex.  Echocardiography was utilized to confirm appropriate wire position and no sign of entanglement in the mitral subvalvular apparatus.    TRANSCATHETER HEART VALVE DEPLOYMENT:   An Edwards Sapien 3 transcatheter heart valve (size 23 mm, model #9600TFX, serial #[id number]) was prepared and crimped per manufacturer's guidelines, and the proper orientation of the valve is confirmed on the [employer] delivery system. The valve was advanced through the introducer sheath using normal technique until in an appropriate position in the abdominal aorta beyond the sheath tip. The balloon was then retracted and using the fine-tuning wheel was centered on the valve. The valve was then advanced across the aortic arch using appropriate flexion of the catheter. The valve was carefully  positioned across the aortic valve annulus. The Commander catheter was retracted using normal technique. Once final position of the valve has been confirmed by angiographic assessment, the valve is deployed while temporarily holding ventilation and during rapid ventricular pacing to maintain systolic blood pressure < 50 mmHg and pulse pressure < 10 mmHg. The balloon inflation is held for >3 seconds after reaching full deployment volume. Once the balloon has fully deflated the balloon is retracted into the ascending aorta and valve function is assessed using echocardiography. There is felt to be no paravalvular leak and no central aortic insufficiency.  The patient's hemodynamic recovery following valve deployment is good.  The deployment balloon and guidewire are both removed.    PROCEDURE COMPLETION:   The sheath was removed and femoral artery closure performed.  Protamine was administered once femoral arterial repair was complete. The temporary pacemaker, pigtail catheters and femoral sheaths were removed with manual pressure used for hemostasis.   The patient tolerated the procedure well and is transported to the surgical intensive care in stable condition. There were no immediate intraoperative complications. All sponge instrument and needle counts are verified correct at completion of the operation.   No blood products were administered during the operation.  The patient received a total of 70.4 mL of intravenous contrast during the procedure.   Rexene Alberts, MD 09/03/2018 9:25 AM

## 2018-09-04 ENCOUNTER — Encounter (HOSPITAL_COMMUNITY): Payer: Self-pay | Admitting: Cardiovascular Disease

## 2018-09-04 ENCOUNTER — Inpatient Hospital Stay (HOSPITAL_COMMUNITY): Payer: Medicare Other

## 2018-09-04 ENCOUNTER — Other Ambulatory Visit: Payer: Self-pay | Admitting: Physician Assistant

## 2018-09-04 DIAGNOSIS — Z952 Presence of prosthetic heart valve: Secondary | ICD-10-CM

## 2018-09-04 DIAGNOSIS — I351 Nonrheumatic aortic (valve) insufficiency: Secondary | ICD-10-CM

## 2018-09-04 LAB — CBC
HEMATOCRIT: 31.3 % — AB (ref 36.0–46.0)
HEMOGLOBIN: 9.5 g/dL — AB (ref 12.0–15.0)
MCH: 26.6 pg (ref 26.0–34.0)
MCHC: 30.4 g/dL (ref 30.0–36.0)
MCV: 87.7 fL (ref 80.0–100.0)
Platelets: 149 10*3/uL — ABNORMAL LOW (ref 150–400)
RBC: 3.57 MIL/uL — ABNORMAL LOW (ref 3.87–5.11)
RDW: 16.4 % — AB (ref 11.5–15.5)
WBC: 8.3 10*3/uL (ref 4.0–10.5)
nRBC: 0 % (ref 0.0–0.2)

## 2018-09-04 LAB — BASIC METABOLIC PANEL
ANION GAP: 5 (ref 5–15)
BUN: 18 mg/dL (ref 8–23)
CHLORIDE: 99 mmol/L (ref 98–111)
CO2: 28 mmol/L (ref 22–32)
CREATININE: 0.75 mg/dL (ref 0.44–1.00)
Calcium: 8.5 mg/dL — ABNORMAL LOW (ref 8.9–10.3)
GFR calc non Af Amer: >60 mL/min (ref >60)
GLUCOSE: 108 mg/dL — AB (ref 70–99)
Potassium: 4.1 mmol/L (ref 3.5–5.1)
Sodium: 132 mmol/L — ABNORMAL LOW (ref 135–145)

## 2018-09-04 LAB — ECHOCARDIOGRAM LIMITED
HEIGHTINCHES: 64 in
WEIGHTICAEL: 2843.2 [oz_av]

## 2018-09-04 LAB — MAGNESIUM: Magnesium: 1.5 mg/dL — ABNORMAL LOW (ref 1.7–2.4)

## 2018-09-04 MED ORDER — PANTOPRAZOLE SODIUM 40 MG PO TBEC: 40.0000 mg | DELAYED_RELEASE_TABLET | Freq: Every day | ORAL | 1 refills | Status: DC

## 2018-09-04 MED ORDER — CLOPIDOGREL BISULFATE 75 MG PO TABS: 75.0000 mg | ORAL_TABLET | Freq: Every day | ORAL | 1 refills | Status: DC

## 2018-09-04 MED ORDER — CARVEDILOL 12.5 MG PO TABS
12.5000 mg | ORAL_TABLET | Freq: Two times a day (BID) | ORAL | Status: DC
  Administered 2018-09-04: 12.5 mg via ORAL
  Filled 2018-09-04: qty 1

## 2018-09-04 MED ORDER — HYDROCHLOROTHIAZIDE 25 MG PO TABS
25.0000 mg | ORAL_TABLET | Freq: Every day | ORAL | Status: DC
  Administered 2018-09-04: 25 mg via ORAL
  Filled 2018-09-04: qty 1

## 2018-09-04 MED ORDER — LOSARTAN POTASSIUM 50 MG PO TABS
50.0000 mg | ORAL_TABLET | Freq: Every day | ORAL | Status: DC
  Administered 2018-09-04: 50 mg via ORAL
  Filled 2018-09-04: qty 1

## 2018-09-04 MED ORDER — ASPIRIN 81 MG PO CHEW: 81.0000 mg | CHEWABLE_TABLET | Freq: Every day | ORAL | Status: DC

## 2018-09-04 NOTE — Progress Notes (Signed)
Pt on bedrest after central line out. Discussed restrictions, ex gl, gave diet sheet, and CRPII. Will refer to [place]. Good reception.  [id number] Yves Dill CES, ACSM 12:11 PM 09/04/2018

## 2018-09-04 NOTE — Progress Notes (Signed)
  Echocardiogram 2D Echocardiogram has been performed.  Zoe [name] 09/04/2018, 11:33 AM

## 2018-09-04 NOTE — Progress Notes (Signed)
Order received to discharge patient.  Telemetry monitor removed and CCMD notified.  PIV access removed.  Right 2-lumen IJ removed without difficulty.  Discharge instructions, follow up, medications and instructions for their use discussed with patient.

## 2018-09-04 NOTE — Discharge Summary (Signed)
[place] VALVE TEAM  Discharge Summary    Patient ID: Zoe [name] MRN: [id number]; DOB: 01-Jan-1934  Admit date: 09/03/2018 Discharge date: 09/04/2018  Primary Care Provider: Jani Gravel, MD  Primary Cardiologist: Dr. Einar Gip / Dr. Angelena Form & Dr. Roxy Manns (TAVR)  Discharge Diagnoses    Principal Problem:   S/P TAVR (transcatheter aortic valve replacement) Active Problems:   History of breast cancer, DCIS, lumpectomy March 2000   Obese   Severe aortic stenosis   Hypothyroidism   Hypertension   Hyperlipidemia   GERD (gastroesophageal reflux disease)   Allergies Allergies  Allergen Reactions  . Penicillins Other (See Comments)    UNSPECIFIED REACTION  Patient does not remember reaction.  Has patient had a PCN reaction causing immediate rash, facial/tongue/throat swelling, SOB or lightheadedness with hypotension: no Has patient had a PCN reaction causing severe rash involving mucus membranes or skin necrosis: no Has patient had a PCN reaction that required hospitalization no Has patient had a PCN reaction occurring within the last 10 years: no If all of the above answers are "NO", then may proceed with Cephalosporin use.   . Sulfa Antibiotics     UNSPECIFIED REACTION  "maybe vision issues? "    Diagnostic Studies/Procedures     TAVR OPERATIVE NOTE   Date of Procedure:                09/03/2018  Preoperative Diagnosis:      Severe Aortic Stenosis   Postoperative Diagnosis:    Same   Procedure:        Transcatheter Aortic Valve Replacement - Percutaneous Right Transfemoral Approach             Edwards Sapien 3 THV (size 23 mm, model # 9600TFX, serial # [id number])              Co-Surgeons:                        Valentina Gu. Roxy Manns, MD and Lauree Chandler, MD  Pre-operative Echo Findings: ? Severe aortic stenosis ? Normal left ventricular systolic function  Post-operative Echo Findings: ? No paravalvular  leak ? Normal left ventricular systolic function   _____________  Echo 09/04/18: completed but not formally read at the time of discharge   History of Present Illness     Zoe [name] is a 84 y.o. female with a history of HTN, HLD, hypothyroidism, breast cancer s/p lumpectomy, carotid artery stenosis,  and severe AS who presented to [facility] on 09/03/18 for planned TAVR.   Patient states that she was first noted to have a heart murmur on physical exam by her primary care physician several years ago. Echocardiogram revealed the presence of severe AS and she was referred to Dr. Johney Maine has been following her carefully ever since.Recent follow-up echocardiogram reportedly demonstrated the presence of severe aortic stenosis with normal left ventricular systolic function.Patient had developed exertional shortness of breath and fatigue. She subsequently underwent diagnostic cardiac catheterization which revealed normal coronary arteries.  The patient has been evaluated by the multidisciplinary valve team and felt to have severe, symptomatic aortic stenosis and to be a suitable candidate for TAVR, which was set up for 09/03/18.    Hospital Course    Consultants: none  Severe AS: s/p successful TAVR with a 23 mm Edwards Sapien 3 THV via the TF approach on 09/03/18. Post operative echo completed but pending formal read. Groin sites are stable.  ECG with NSR and no high grade heart block. Continue ASA and plavix. Plan for discharge home today with follow up in office in 1 week.   HTN: BP was elevated post operatively. Home antihypertensive regimen has been resumed.   HLD: continue synthroid  GERD: prilsoec has been changed to protonix due to potential interaction with plavix.  _____________  Discharge Vitals Blood pressure (!) 155/63, pulse 80, temperature 98.8 F (37.1 C), temperature source Oral, resp. rate 18, height 5\' 4"  (1.626 m), weight 80.6 kg, SpO2 92 %.  Filed Weights    09/04/18 0215  Weight: 80.6 kg    Labs & Radiolgic Studies    CBC Recent Labs    09/03/18 0947 09/04/18 0200  WBC  --  8.3  HGB 10.9* 9.5*  HCT 32.0* 31.3*  MCV  --  87.7  PLT  --  423*   Basic Metabolic Panel Recent Labs    09/03/18 0947 09/04/18 0200  NA 138 132*  K 4.0 4.1  CL 99 99  CO2  --  28  GLUCOSE 127* 108*  BUN 17 18  CREATININE 0.60 0.75  CALCIUM  --  8.5*  MG  --  1.5*   Liver Function Tests No results for input(s): AST, ALT, ALKPHOS, BILITOT, PROT, ALBUMIN in the last 72 hours. No results for input(s): LIPASE, AMYLASE in the last 72 hours. Cardiac Enzymes No results for input(s): CKTOTAL, CKMB, CKMBINDEX, TROPONINI in the last 72 hours. BNP Invalid input(s): POCBNP D-Dimer No results for input(s): DDIMER in the last 72 hours. Hemoglobin A1C No results for input(s): HGBA1C in the last 72 hours. Fasting Lipid Panel No results for input(s): CHOL, HDL, LDLCALC, TRIG, CHOLHDL, LDLDIRECT in the last 72 hours. Thyroid Function Tests No results for input(s): TSH, T4TOTAL, T3FREE, THYROIDAB in the last 72 hours.  Invalid input(s): FREET3 _____________  Dg Chest 2 View  Result Date: 08/30/2018 CLINICAL DATA:  Preop screening for surgery on Tuesday. EXAM: CHEST - 2 VIEW COMPARISON:  12/13/2017 FINDINGS: Lungs are adequately inflated without focal lobar consolidation or effusion. Mild stable cardiomegaly. Remainder of the exam is unchanged. IMPRESSION: No active cardiopulmonary disease. Electronically Signed   By: Marin Olp M.D.   On: 08/30/2018 14:00   Ct Coronary Morph W/cta Cor W/score W/ca W/cm &/or Wo/cm  Addendum Date: 08/15/2018   ADDENDUM REPORT: 08/15/2018 14:53 CLINICAL DATA:  Aortic stenosis EXAM: Cardiac TAVR CT TECHNIQUE: The patient was scanned on a Siemens Force 536 slice scanner. A 120 kV retrospective scan was triggered in the descending thoracic aorta at 111 HU's. Gantry rotation speed was 270 msecs and collimation was .9 mm. No beta  blockade or nitro were given. The 3D data set was reconstructed in 5% intervals of the R-R cycle. Systolic and diastolic phases were analyzed on a dedicated work station using MPR, MIP and VRT modes. The patient received 80 cc of contrast. FINDINGS: Aortic Valve: Tri leaflet calcified with restricted leaflet motion Aorta: Mild calcific atherosclerosis with normal arch vessels Sinotubular Junction: 23 mm Ascending Thoracic Aorta: 28 mm Aortic Arch: 24 mm Descending Thoracic Aorta: 23 mm Sinus of Valsalva Measurements: Non-coronary: 27 mm Right - coronary: 26.4 mm Left - coronary: 27.2 mm Coronary Artery Height above Annulus: Left Main: 11 mm above annulus Right Coronary: 14 mm above annulus Virtual Basal Annulus Measurements: Maximum/Minimum Diameter: 18 x 25 mm Perimeter: 70.6 mm Area: 373 mm2 Coronary Arteries: Sufficient height above annulus for deployment Optimum Fluoroscopic Angle for Delivery: LAO 25 Caudal 2  degrees IMPRESSION: 1. Calcified tri leaflet AV with annular area of 373 mm2 suitable for a 23 mm Sapien 3 valve 2.  Coronary arteries sufficient height above annulus for deployment 3.  Normal aortic  root 2.8 cm 4. Optimum angiographic angle for deployment LAO 25 Caudal 2 degrees 5.  No LAA thrombus Jenkins Rouge Electronically Signed   By: Jenkins Rouge M.D.   On: 08/15/2018 14:53   Result Date: 08/15/2018 EXAM: OVER-READ INTERPRETATION  CT CHEST The following report is an over-read performed by radiologist Dr. Vinnie Langton of [facility], [place] on 08/15/2018. This over-read does not include interpretation of cardiac or coronary anatomy or pathology. The coronary calcium score/coronary CTA interpretation by the cardiologist is attached. COMPARISON:  Chest CT 07/31/2006. FINDINGS: Extracardiac findings will be described separately under dictation for contemporaneously obtained CTA chest, abdomen and pelvis. IMPRESSION: Please see separate dictation for contemporaneously obtained CTA chest,  abdomen and pelvis dated 08/15/2018 for full description of relevant extracardiac findings. Electronically Signed: By: Vinnie Langton M.D. On: 08/15/2018 14:34   Dg Chest Port 1 View  Result Date: 09/03/2018 CLINICAL DATA:  Status post TAVR. EXAM: PORTABLE CHEST 1 VIEW COMPARISON:  Radiographs dated 08/30/2018 and 12/13/2017 FINDINGS: TAVR now in place. Central venous catheter tip is at the cavoatrial junction in good position. Mild chronic cardiomegaly. Pulmonary vascularity is normal. Aortic atherosclerosis. No infiltrates or effusions. No pneumothorax. IMPRESSION: Satisfactory appearance of the chest after TAVR. No acute abnormalities. Aortic Atherosclerosis (ICD10-I70.0). Electronically Signed   By: Lorriane Shire M.D.   On: 09/03/2018 10:36   Ct Angio Chest Aorta W &/or Wo Contrast  Result Date: 08/15/2018 CLINICAL DATA:  84 year old female with history of severe aortic stenosis. Preprocedural study prior to potential transcatheter aortic valve replacement (TAVR) procedure. EXAM: CT ANGIOGRAPHY CHEST, ABDOMEN AND PELVIS TECHNIQUE: Multidetector CT imaging through the chest, abdomen and pelvis was performed using the standard protocol during bolus administration of intravenous contrast. Multiplanar reconstructed images and MIPs were obtained and reviewed to evaluate the vascular anatomy. CONTRAST:  151mL ISOVUE-370 IOPAMIDOL (ISOVUE-370) INJECTION 76% COMPARISON:  Chest CT 07/31/2006. No prior CT the abdomen and pelvis. FINDINGS: CTA CHEST FINDINGS Cardiovascular: Heart size is enlarged with concentric left ventricular hypertrophy and biatrial dilatation. There is no significant pericardial fluid, thickening or pericardial calcification. Aortic atherosclerosis. No definite coronary artery calcifications. Severe thickening calcification of the aortic valve. Severe calcifications of the mitral annulus. Mediastinum/Lymph Nodes: Multiple prominent borderline enlarged and mildly enlarged mediastinal and  hilar lymph nodes, largest of which measures up to 18 mm in the subcarinal nodal station and 17 mm in the low right paratracheal nodal station. Esophagus is unremarkable in appearance. No axillary lymphadenopathy. Lungs/Pleura: Widespread ground-glass attenuation and interlobular septal thickening, favored to reflect a background of mild interstitial pulmonary edema. Scattered linear areas, most compatible with areas of post infectious or inflammatory scarring or atelectasis. Small right and trace left pleural effusions lying dependently. No confluent consolidative airspace disease. No definite suspicious appearing pulmonary nodules or masses are noted. Musculoskeletal/Soft Tissues: Chronic T12 compression fracture with 10% loss of anterior vertebral body height and post vertebroplasty changes. There are no aggressive appearing lytic or blastic lesions noted in the visualized portions of the skeleton. CTA ABDOMEN AND PELVIS FINDINGS Hepatobiliary: Large lesion in the liver, centered predominantly in segment 8 measuring 5.4 x 7.8 cm (axial image 81 of series 7) which is centrally low-attenuation with avid peripheral nodular enhancement on the arterial phase images obtained, similar in size to a low-attenuation  lesion on study from 2007, most compatible with a large cavernous hemangioma. In addition, there is a 13 x 10 mm hypervascular lesion also in segment 8 (axial image 71 of series 7), with surrounding perfusion anomaly, which is also similar in retrospect to prior study from 2007, presumably a smaller flash fill cavernous hemangioma. No other suspicious hepatic lesions. No intra or extrahepatic biliary ductal dilatation. Gallbladder is normal in appearance. Pancreas: No pancreatic mass. No pancreatic ductal dilatation. No pancreatic or peripancreatic fluid or inflammatory changes. Spleen: Unremarkable. Adrenals/Urinary Tract: Bilateral kidneys and adrenal glands are normal in appearance. No hydroureteronephrosis.  Small amount of gas non dependently in the lumen of the urinary bladder. Urinary bladder is otherwise unremarkable in appearance. Stomach/Bowel: Normal appearance of the stomach. No pathologic dilatation of small bowel or colon. A few scattered colonic diverticulae are noted, particularly in the sigmoid colon, without surrounding inflammatory changes to suggest an acute diverticulitis at this time. Normal appendix. Vascular/Lymphatic: Aortic atherosclerosis, without evidence of aneurysm or dissection in the abdominal or pelvic vasculature. Vascular findings and measurements pertinent to potential TAVR procedure, as detailed below. No lymphadenopathy noted in the abdomen or pelvis. Reproductive: Status post total abdominal hysterectomy. Ovaries are unremarkable in appearance. Other: No significant volume of ascites.  No pneumoperitoneum. Musculoskeletal: There are no aggressive appearing lytic or blastic lesions noted in the visualized portions of the skeleton. VASCULAR MEASUREMENTS PERTINENT TO TAVR: AORTA: Minimal Aortic Diameter-12 x 11 mm Severity of Aortic Calcification-severe RIGHT PELVIS: Right Common Iliac Artery - Minimal Diameter-10.3 x 8.0 mm Tortuosity-mild Calcification-mild Right External Iliac Artery - Minimal Diameter-7.2 x 7.4 mm Tortuosity - mild Calcification-none Right Common Femoral Artery - Minimal Diameter-7.8 x 7.1 mm Tortuosity - mild Calcification-mild LEFT PELVIS: Left Common Iliac Artery - Minimal Diameter-8.4 x 9.1 mm Tortuosity - mild Calcification-mild Left External Iliac Artery - Minimal Diameter-7.3 x 8.1 mm Tortuosity - mild Calcification-none Left Common Femoral Artery - Minimal Diameter-7.5 x 7.3 mm Tortuosity - mild Calcification-mild Review of the MIP images confirms the above findings. IMPRESSION: 1. Vascular findings and measurements pertinent to potential TAVR procedure, as detailed above. 2. Severe thickening calcification of the aortic valve, compatible with the reported  clinical history of severe aortic stenosis. 3. Cardiomegaly with concentric left ventricular hypertrophy and biatrial dilatation. There is also evidence of interstitial pulmonary edema in the lungs bilaterally and bilateral pleural effusions (small on the right and trace on the left), suggesting congestive heart failure. 4. Multiple borderline enlarged and mildly enlarged mediastinal and hilar lymph nodes, nonspecific in the setting of presumed congestive heart failure. No specific findings to strongly suggest malignancy on today's examination. 5. Mild colonic diverticulosis without evidence of acute diverticulitis at this time. 6. Two hypervascular liver lesions, as above, compatible with cavernous hemangiomas. 7. Additional incidental findings, as above. Electronically Signed   By: Vinnie Langton M.D.   On: 08/15/2018 15:33   Ct Angio Abd/pel W/ And/or W/o  Result Date: 08/15/2018 CLINICAL DATA:  84 year old female with history of severe aortic stenosis. Preprocedural study prior to potential transcatheter aortic valve replacement (TAVR) procedure. EXAM: CT ANGIOGRAPHY CHEST, ABDOMEN AND PELVIS TECHNIQUE: Multidetector CT imaging through the chest, abdomen and pelvis was performed using the standard protocol during bolus administration of intravenous contrast. Multiplanar reconstructed images and MIPs were obtained and reviewed to evaluate the vascular anatomy. CONTRAST:  173mL ISOVUE-370 IOPAMIDOL (ISOVUE-370) INJECTION 76% COMPARISON:  Chest CT 07/31/2006. No prior CT the abdomen and pelvis. FINDINGS: CTA CHEST FINDINGS Cardiovascular: Heart size is enlarged  with concentric left ventricular hypertrophy and biatrial dilatation. There is no significant pericardial fluid, thickening or pericardial calcification. Aortic atherosclerosis. No definite coronary artery calcifications. Severe thickening calcification of the aortic valve. Severe calcifications of the mitral annulus. Mediastinum/Lymph Nodes: Multiple  prominent borderline enlarged and mildly enlarged mediastinal and hilar lymph nodes, largest of which measures up to 18 mm in the subcarinal nodal station and 17 mm in the low right paratracheal nodal station. Esophagus is unremarkable in appearance. No axillary lymphadenopathy. Lungs/Pleura: Widespread ground-glass attenuation and interlobular septal thickening, favored to reflect a background of mild interstitial pulmonary edema. Scattered linear areas, most compatible with areas of post infectious or inflammatory scarring or atelectasis. Small right and trace left pleural effusions lying dependently. No confluent consolidative airspace disease. No definite suspicious appearing pulmonary nodules or masses are noted. Musculoskeletal/Soft Tissues: Chronic T12 compression fracture with 10% loss of anterior vertebral body height and post vertebroplasty changes. There are no aggressive appearing lytic or blastic lesions noted in the visualized portions of the skeleton. CTA ABDOMEN AND PELVIS FINDINGS Hepatobiliary: Large lesion in the liver, centered predominantly in segment 8 measuring 5.4 x 7.8 cm (axial image 81 of series 7) which is centrally low-attenuation with avid peripheral nodular enhancement on the arterial phase images obtained, similar in size to a low-attenuation lesion on study from 2007, most compatible with a large cavernous hemangioma. In addition, there is a 13 x 10 mm hypervascular lesion also in segment 8 (axial image 71 of series 7), with surrounding perfusion anomaly, which is also similar in retrospect to prior study from 2007, presumably a smaller flash fill cavernous hemangioma. No other suspicious hepatic lesions. No intra or extrahepatic biliary ductal dilatation. Gallbladder is normal in appearance. Pancreas: No pancreatic mass. No pancreatic ductal dilatation. No pancreatic or peripancreatic fluid or inflammatory changes. Spleen: Unremarkable. Adrenals/Urinary Tract: Bilateral kidneys and  adrenal glands are normal in appearance. No hydroureteronephrosis. Small amount of gas non dependently in the lumen of the urinary bladder. Urinary bladder is otherwise unremarkable in appearance. Stomach/Bowel: Normal appearance of the stomach. No pathologic dilatation of small bowel or colon. A few scattered colonic diverticulae are noted, particularly in the sigmoid colon, without surrounding inflammatory changes to suggest an acute diverticulitis at this time. Normal appendix. Vascular/Lymphatic: Aortic atherosclerosis, without evidence of aneurysm or dissection in the abdominal or pelvic vasculature. Vascular findings and measurements pertinent to potential TAVR procedure, as detailed below. No lymphadenopathy noted in the abdomen or pelvis. Reproductive: Status post total abdominal hysterectomy. Ovaries are unremarkable in appearance. Other: No significant volume of ascites.  No pneumoperitoneum. Musculoskeletal: There are no aggressive appearing lytic or blastic lesions noted in the visualized portions of the skeleton. VASCULAR MEASUREMENTS PERTINENT TO TAVR: AORTA: Minimal Aortic Diameter-12 x 11 mm Severity of Aortic Calcification-severe RIGHT PELVIS: Right Common Iliac Artery - Minimal Diameter-10.3 x 8.0 mm Tortuosity-mild Calcification-mild Right External Iliac Artery - Minimal Diameter-7.2 x 7.4 mm Tortuosity - mild Calcification-none Right Common Femoral Artery - Minimal Diameter-7.8 x 7.1 mm Tortuosity - mild Calcification-mild LEFT PELVIS: Left Common Iliac Artery - Minimal Diameter-8.4 x 9.1 mm Tortuosity - mild Calcification-mild Left External Iliac Artery - Minimal Diameter-7.3 x 8.1 mm Tortuosity - mild Calcification-none Left Common Femoral Artery - Minimal Diameter-7.5 x 7.3 mm Tortuosity - mild Calcification-mild Review of the MIP images confirms the above findings. IMPRESSION: 1. Vascular findings and measurements pertinent to potential TAVR procedure, as detailed above. 2. Severe thickening  calcification of the aortic valve, compatible with the reported clinical  history of severe aortic stenosis. 3. Cardiomegaly with concentric left ventricular hypertrophy and biatrial dilatation. There is also evidence of interstitial pulmonary edema in the lungs bilaterally and bilateral pleural effusions (small on the right and trace on the left), suggesting congestive heart failure. 4. Multiple borderline enlarged and mildly enlarged mediastinal and hilar lymph nodes, nonspecific in the setting of presumed congestive heart failure. No specific findings to strongly suggest malignancy on today's examination. 5. Mild colonic diverticulosis without evidence of acute diverticulitis at this time. 6. Two hypervascular liver lesions, as above, compatible with cavernous hemangiomas. 7. Additional incidental findings, as above. Electronically Signed   By: Vinnie Langton M.D.   On: 08/15/2018 15:33   Disposition   Pt is being discharged home today in good condition.  Follow-up Plans & Appointments    Follow-up Information    Eileen Stanford, PA-C. Go on 09/12/2018.   Specialties:  Cardiology, Radiology Why:  @ 2pm, please arrive at least 10 minutes early.  Contact information: [address] [place] [id number] [phone]            Discharge Medications   Allergies as of 09/04/2018      Reactions   Penicillins Other (See Comments)   UNSPECIFIED REACTION  Patient does not remember reaction.  Has patient had a PCN reaction causing immediate rash, facial/tongue/throat swelling, SOB or lightheadedness with hypotension: no Has patient had a PCN reaction causing severe rash involving mucus membranes or skin necrosis: no Has patient had a PCN reaction that required hospitalization no Has patient had a PCN reaction occurring within the last 10 years: no If all of the above answers are "NO", then may proceed with Cephalosporin use.   Sulfa Antibiotics    UNSPECIFIED  REACTION  "maybe vision issues? "      Medication List    STOP taking these medications   omeprazole 20 MG capsule Commonly known as:  PRILOSEC     TAKE these medications   aspirin 81 MG chewable tablet Chew 1 tablet (81 mg total) by mouth daily. Start taking on:  09/05/2018   carvedilol 12.5 MG tablet Commonly known as:  COREG Take 12.5 mg by mouth 2 (two) times daily with a meal.   clopidogrel 75 MG tablet Commonly known as:  PLAVIX Take 1 tablet (75 mg total) by mouth daily with breakfast. Start taking on:  09/05/2018   hydrochlorothiazide 25 MG tablet Commonly known as:  HYDRODIURIL Take 25 mg by mouth daily.   levothyroxine 100 MCG tablet Commonly known as:  SYNTHROID, LEVOTHROID Take 100 mcg by mouth daily before breakfast.   losartan 25 MG tablet Commonly known as:  COZAAR Take 50 mg by mouth daily.   pantoprazole 40 MG tablet Commonly known as:  PROTONIX Take 1 tablet (40 mg total) by mouth daily.   pravastatin 40 MG tablet Commonly known as:  PRAVACHOL Take 40 mg by mouth every evening.   PRESERVISION AREDS 2 Caps Take 1 tablet by mouth 2 (two) times daily.   SOOTHE XP Soln Place 1 drop into both eyes every evening.   Vitamin D3 50 MCG (2000 UT) Tabs Take 2,000 Units by mouth daily.         Outstanding Labs/Studies   none  Duration of Discharge Encounter   Greater than 30 minutes including physician time.  SignedAngelena Form, PA-C 09/04/2018, 11:11 AM [phone]

## 2018-09-04 NOTE — Discharge Instructions (Signed)
Your prilsoec has been changed to protonix due to a potential interaction with plavix, that would make the drug less effective. When you complete your 6 months of plavix you may go back on your prilosec.  ACTIVITY AND EXERCISE  Daily activity and exercise are an important part of your recovery. People recover at different rates depending on their general health and type of valve procedure.  Most people recovering from TAVR feel better relatively quickly   No lifting, pushing, pulling more than 10 pounds (examples to avoid: groceries, vacuuming, gardening, golfing):             - For one week with a procedure through the groin.             - For six weeks for procedures through the chest wall or neck NOTE: You will typically see one of our providers 7-14 days after your procedure to discuss [place] the above activities.      DRIVING  Do not drive for until you are seen for follow up and cleared by a provider. Generally, we ask patient to not drive for 1 week after their procedure.  If you have been told by your doctor in the past that you may not drive, you must talk with him/her before you begin driving again.     DRESSING  Groin site: you may leave the clear dressing over the site for up to one week or until it falls off.     HYGIENE  If you had a femoral (leg) procedure, you may take a shower when you return home. After the shower, pat the site dry. Do NOT use powder, oils or lotions in your groin area until the site has completely healed.  If you had a chest procedure, you may shower when you return home unless specifically instructed not to by your discharging practitioner.             - DO NOT scrub incision; pat dry with a towel             - DO NOT apply any lotions, oils, powders to the incision             - No tub baths / swimming for at least 2 weeks.  If you notice any fevers, chills, increased pain, swelling, bleeding or pus, please contact your doctor.     ADDITIONAL INFORMATION  If you are going to have an upcoming dental procedure, please contact our office as you will require antibiotics ahead of time to prevent infection on your heart valve.    If you have any questions or concerns you can call the structural heart phone during normal business hours 8am-4pm. If you have an urgent need after hours or weekends please call [phone] to talk to the on call provider for general cardiology. If you have an emergency that requires immediate attention, please call 911.    After TAVR Checklist  Check  Test Description   Follow up appointment in 1-2 weeks  All of our patients will see our structural heart physician assistant, Nell Range. Your incision sites will be checked and you will be cleared to drive and resume all normal activities if you are doing well.     1 month echo and follow up  You will have an echo to check on your new heart valve and be seen back in the office by Nell Range. Many times the echo is not read by your appointment time, but  Joellen Jersey will call you later that day or the following day to report your results.   Follow up with your primary cardiologist You will need to be seen by your primary cardiologist in the following 3-6 months after your 1 month appointment in the valve clinic. Often times your Plavix or Aspirin will be discontinued during this time, but this is decided on a case by case basis.    1 year echo and follow up You will have another echo to check on your heart valve after 1 year and be seen back in the office by Nell Range. This your last structural heart visit.   Bacterial endocarditis prophylaxis  You will have to take antibiotics for the rest of your life before all dental procedures (even teeth cleanings) to protect your heart valve. Antibiotics are also required before some surgeries. Please check with your cardiologist before scheduling any surgeries. Also, please make sure to tell us if you have a  penicillin allergy as you will require an alternative antibiotic.

## 2018-09-04 NOTE — Progress Notes (Signed)
Spoke with RN Delsa Sale about the DC central line order, RN stated she was not aware until now and will take care of the line

## 2018-09-04 NOTE — Progress Notes (Signed)
  Echocardiogram 2D Echocardiogram has been performed.  Zoe [name] 09/04/2018, 11:31 AM

## 2018-09-04 NOTE — Addendum Note (Signed)
Addendum  created 09/04/18 1605 by Belinda Block, MD   Intraprocedure Blocks edited, Sign clinical note

## 2018-09-05 ENCOUNTER — Encounter: Payer: Self-pay | Admitting: Physician Assistant

## 2018-09-05 ENCOUNTER — Telehealth: Payer: Self-pay | Admitting: Physician Assistant

## 2018-09-05 ENCOUNTER — Telehealth (HOSPITAL_COMMUNITY): Payer: Self-pay

## 2018-09-05 NOTE — Telephone Encounter (Signed)
  [place] VALVE TEAM   Patient contacted regarding discharge from [facility] on 11/13  Patient understands to follow up with provider Nell Range on 1/21@ 2pm at [place].  Patient understands discharge instructions? yes Patient understands medications and regiment? yes Patient understands to bring all medications to this visit? yes  Angelena Form PA-C  [facility]

## 2018-09-05 NOTE — Telephone Encounter (Signed)
Pt returned CR phone call, adv pt she will need to schedule and complete her f/u appt with Dr. Einar Gip office. Pt verbalized understanding, placed paperwork in "no f/u folder".  Explained scheduling process and went over insurance, patient verbalized understanding. Will contact patient for scheduling once f/u has been completed.

## 2018-09-05 NOTE — Telephone Encounter (Signed)
Pt insurance is active and benefits verified through [facility]. Co-pay $20.00, DED $0.00/$0.00 met, out of pocket $4,400.00/$785.12 met, co-insurance 0%. No pre-authorization required. Passport, 09/05/18 @ 10:29AM, REF# [id number]  Will contact patient to see if she is interested in the Cardiac Rehab Program. If interested, patient will need to complete follow up appt. Once completed, patient will be contacted for scheduling upon review by the RN Navigator.

## 2018-09-05 NOTE — Telephone Encounter (Signed)
Pt has not schedule a follow up appt with Dr. Einar Gip. Attempted to contact pt in regards to CR, LMTCB

## 2018-09-09 NOTE — Progress Notes (Signed)
[facility] [place]                                       Cardiology Office Note    Date:  09/12/2018   ID:  [name], DOB 1934/05/27, MRN [id number]  PCP:  Jani Gravel, MD  Cardiologist:  Dr. Einar Gip / Dr. Angelena Form & Dr. Roxy Manns (TAVR)  CC: [facility] s/p TAVR   History of Present Illness:  Zoe [name] is a 84 y.o. female with a history of HTN, HLD, hypothyroidism, breast cancer s/p lumpectomy, carotid artery stenosis,  and severe AS s/p TAVR (09/03/18) who presents to clinic for follow up.   Patient states that she was first noted to have a heart murmur on physical exam by her primary care physician several years ago. Echocardiogram revealed the presence of severe AS and she was referred to Dr. Johney Maine has been following her carefully ever since.Recent follow-up echocardiogram reportedly demonstrated the presence of severe aortic stenosis with normal left ventricular systolic function.Patient had developed exertional shortness of breath and fatigue. She subsequently underwent diagnostic cardiac catheterization which revealed normal coronary arteries.  The patient was evaluated by the multidisciplinary valve team and underwent successful TAVR with a 23 mm Edwards Sapien 3 THV via the TF approach on 09/03/18. Post operative echo showed normally functioning TAVR with mean gradient of 15 mm Hg and no PVL. She was discharged on Aspirin and plavix.   Today she presents to clinic for follow up. She initially felt great for the first 2-3 days but this Tuesday started feeling unwell with a deep productive cough with greens sputum and a spot of blood this morning. She has run a low grade fever and has some slight chills. She also has some rhinorrhea. No CP. She has some shortness of breath related to her cough. No LE edema, orthopnea or PND. No dizziness or syncope. No blood in stool or urine. No palpitations.   Past Medical History:  Diagnosis  Date  . Arthritis    some - per patient  . Breast cancer ([place])    breast cancer / left   . Cataract    bilat   . GERD (gastroesophageal reflux disease)   . History of kidney stones   . Hyperlipidemia   . Hypertension   . Hypothyroidism   . Macular degeneration    Left  . S/P TAVR (transcatheter aortic valve replacement) 09/03/2018   23 mm Edwards Sapien 3 transcatheter heart valve placed via percutaneous right transfemoral approach   . Severe aortic stenosis   . Stress incontinence   . Thyroid disease   . Tinnitus     Past Surgical History:  Procedure Laterality Date  . ABDOMINAL HYSTERECTOMY  1970's  . BACK SURGERY    . BREAST LUMPECTOMY  12/1998   lumpectomy  . CARDIAC CATHETERIZATION    . EYE SURGERY     cataract surgery bilat   . INTRAOPERATIVE TRANSTHORACIC ECHOCARDIOGRAM N/A 09/03/2018   Procedure: INTRAOPERATIVE TRANSTHORACIC ECHOCARDIOGRAM;  Surgeon: Burnell Blanks, MD;  Location: [facility];  Service: Open Heart Surgery;  Laterality: N/A;  . LITHOTRIPSY    . Right total knee     2018 Dr. Alvan Dame  . RIGHT/LEFT HEART CATH AND CORONARY ANGIOGRAPHY N/A 08/06/2018   Procedure: RIGHT/LEFT HEART CATH AND CORONARY ANGIOGRAPHY;  Surgeon: Adrian Prows, MD;  Location: [facility];  Service: Cardiovascular;  Laterality: N/A;  . THYROIDECTOMY, PARTIAL  1975  . TONSILLECTOMY     as a child - patient not sure of exact date  . TOTAL KNEE ARTHROPLASTY Left 03/13/2016   Procedure: TOTAL KNEE ARTHROPLASTY;  Surgeon: Paralee Cancel, MD;  Location: [facility];  Service: Orthopedics;  Laterality: Left;  . TOTAL KNEE ARTHROPLASTY Right 06/18/2017   Procedure: RIGHT TOTAL KNEE ARTHROPLASTY;  Surgeon: Paralee Cancel, MD;  Location: [facility];  Service: Orthopedics;  Laterality: Right;  . TRANSCATHETER AORTIC VALVE REPLACEMENT, TRANSFEMORAL N/A 09/03/2018   Procedure: TRANSCATHETER AORTIC VALVE REPLACEMENT, TRANSFEMORAL;  Surgeon: Burnell Blanks, MD;  Location: [facility];  Service: Open  Heart Surgery;  Laterality: N/A;    Current Medications: Outpatient Medications Prior to Visit  Medication Sig Dispense Refill  . Artificial Tear Solution (SOOTHE XP) SOLN Place 1 drop into both eyes every evening.    Marland Kitchen aspirin 81 MG chewable tablet Chew 1 tablet (81 mg total) by mouth daily.    . carvedilol (COREG) 12.5 MG tablet Take 12.5 mg by mouth 2 (two) times daily with a meal.    . Cholecalciferol (VITAMIN D3) 2000 units TABS Take 2,000 Units by mouth daily.    . clopidogrel (PLAVIX) 75 MG tablet Take 1 tablet (75 mg total) by mouth daily with breakfast. 90 tablet 1  . hydrochlorothiazide (HYDRODIURIL) 25 MG tablet Take 25 mg by mouth daily.  4  . levothyroxine (SYNTHROID, LEVOTHROID) 100 MCG tablet Take 100 mcg by mouth daily before breakfast.  2  . losartan (COZAAR) 25 MG tablet Take 50 mg by mouth daily.     . Multiple Vitamins-Minerals (PRESERVISION AREDS 2) CAPS Take 1 tablet by mouth 2 (two) times daily.    . pantoprazole (PROTONIX) 40 MG tablet Take 1 tablet (40 mg total) by mouth daily. 90 tablet 1  . pravastatin (PRAVACHOL) 40 MG tablet Take 40 mg by mouth every evening.     No facility-administered medications prior to visit.      Allergies:   Penicillins and Sulfa antibiotics   Social History   Socioeconomic History  . Marital status: Married    Spouse name: Not on file  . Number of children: 4  . Years of education: Not on file  . Highest education level: Not on file  Occupational History  . 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 Financial resource strain: Not on file  . Food insecurity:    Worry: Not on file    Inability: Not on file  . Transportation needs:    Medical: Not on file    Non-medical: Not on file  Tobacco Use  . Smoking status: Never Smoker  . Smokeless tobacco: Never Used  Substance and Sexual Activity  . Alcohol use: No  . Drug use: No  . Sexual activity: Not Currently  Lifestyle  .  Physical activity:    Days per week: Not on file    Minutes per session: Not on file  . Stress: Not on file  Relationships  . Social connections:    Talks on phone: Not on file    Gets together: Not on file    Attends religious service: Not on file    Active member of club or organization: Not on file    Attends meetings of clubs or organizations: Not on file    Relationship status: Not on file  Other Topics Concern  . Not on file  Social  History Narrative  . Not on file     Family History:  The patient's family history includes Diabetes in her mother; Heart disease in her father; Stroke in her mother.      ROS:   Please see the history of present illness.    ROS All other systems reviewed and are negative.   PHYSICAL EXAM:   VS:  BP (!) 148/62   Pulse 84   Ht 5\' 4"  (1.626 m)   Wt 180 lb (81.6 kg)   SpO2 92%   BMI 30.90 kg/m   GEN: Well nourished, well developed, in no acute distress HEENT: normal Neck: no JVD or masses Cardiac: RRR; 2/6 SEM. No rubs, or gallops,no edema  Respiratory: diffuse rhonchi and wheezing GI: soft, nontender, nondistended, + BS MS: no deformity or atrophy Skin: warm and dry, no rash. Groin sites clear Neuro:  Alert and Oriented x 3, Strength and sensation are intact Psych: euthymic mood, full affect    Wt Readings from Last 3 Encounters:  09/12/18 180 lb (81.6 kg)  09/04/18 177 lb 11.2 oz (80.6 kg)  08/30/18 174 lb 12.8 oz (79.3 kg)      Studies/Labs Reviewed:   EKG:  EKG is ordered today.  The ekg ordered today demonstrates NSR HR 84  Recent Labs: 08/30/2018: ALT 16; B Natriuretic Peptide 165.7 09/04/2018: BUN 18; Creatinine, Ser 0.75; Hemoglobin 9.5; Magnesium 1.5; Platelets 149; Potassium 4.1; Sodium 132   Lipid Panel No results found for: CHOL, TRIG, HDL, CHOLHDL, VLDL, LDLCALC, LDLDIRECT  Additional studies/ records that were reviewed today include:  TAVR OPERATIVE NOTE   Date of  Procedure:09/03/2018  Preoperative Diagnosis:Severe Aortic Stenosis   Postoperative Diagnosis:Same   Procedure:   Transcatheter Aortic Valve Replacement - PercutaneousRightTransfemoral Approach Edwards Sapien 3 THV (size 64mm, model # 9600TFX, serial # [id number])  Co-Surgeons:Clarence H. Roxy Manns, MD and Lauree Chandler, MD  Pre-operative Echo Findings: ? Severe aortic stenosis ? Normalleft ventricular systolic function  Post-operative Echo Findings: ? Noparavalvular leak ? Normalleft ventricular systolic function   _____________  Echo 09/04/18 Study Conclusions - Left ventricle: The cavity size was normal. Wall thickness was   increased in a pattern of moderate LVH. Systolic function was   vigorous. The estimated ejection fraction was in the range of 65%   to 70%. Wall motion was normal; there were no regional wall   motion abnormalities. - Aortic valve: s/p TAVR 09/03/2018 Percell Miller Sapien 3, 23 mm). The   prosthesis had a normal range of motion. The sewing ring appeared   normal, had no rocking motion, and showed no evidence of   dehiscence. Mean gradient (S): 15 mm Hg. No prosthetic or   periprosthetic regurgitation. Valve area (VTI): 1.45 cm^2. Valve   area (Vmax): 1.43 cm^2. - Mitral valve: Calcified annulus. There was trivial regurgitation.   Mean gradient (D): 4 mm Hg (HR 78 BPM). - Left atrium: The atrium was mildly dilated. - Right ventricle: The cavity size was normal. Wall thickness was   normal. Systolic function was normal. - Right atrium: The atrium was normal in size. Central venous   pressure (est): 8 mm Hg. - Tricuspid valve: There was trivial regurgitation. - Inferior vena cava: The vessel was normal in size. The   respirophasic diameter changes were blunted (< 50%). - Pericardium, extracardiac: There was no pericardial effusion. - Compared to the immediate  post-procedure TTE, the mean gradient   in systole of the aortic prosthesis has increased slightly. No   other significant  changes on side by side comparison of images.  ASSESSMENT & PLAN:   Severe AS s/p TAVR: doing well but now has bad cough. Groin sites are stable. ECG with sinus and no HAVB. Continue aspirin and plavix. SBE prophylaxis discussed; I have RX'd clindamycin due to a PCN allergy. I will see her back on 12/12 for 1 month follow up with echo  HTN: BP initially elevated but normal on my personal recheck. Continue current regimen   HLD: continue statin   GERD: continue protonix   Cough: she has had productive cough that is keeping her up at night. Lung exam reveals diffuse rhonchi and wheezing. She has had a very low grade fever. Continue supportive care. I have Rx'd a cough syrup to help her get some rest. WE discussed how she likely has a viral URI but will get a CXR to r/o pneumonia that might require antibiotics.   Medication Adjustments/Labs and Tests Ordered: Current medicines are reviewed at length with the patient today.  Concerns regarding medicines are outlined above.  Medication changes, Labs and Tests ordered today are listed in the Patient Instructions below. Patient Instructions  Medication Instructions:  Your physician has recommended you make the following change in your medication:   TAKE Clindamycin 600 mg 1 hour before dental work Your cough syrup has been sent to your pharmacy. You may take 5 mL up to 3 times per day as needed for cough. It will likely make you sleepy.  If you need a refill on your cardiac medications before your next appointment, please call your pharmacy.   Lab work: None Ordered   Testing/Procedures: A chest x-ray takes a picture of the organs and structures inside the chest, including the heart, lungs, and blood vessels. This test can show several things, including, whether the heart is enlarges; whether fluid is building up in  the lungs; and whether pacemaker / defibrillator leads are still in place. Go to [place] at [facility] for xray  Follow-Up: You have been cleared to resume driving  Your physician recommends that you keep your appointments for Dec. 12 at 11:30 for echo and appointment with Nell Range, PA     Signed, Angelena Form, PA-C  09/12/2018 2:44 PM    [address], [place], [place]  [address] Phone: [phone]; Fax: [phone]

## 2018-09-10 ENCOUNTER — Telehealth: Payer: Self-pay | Admitting: Physician Assistant

## 2018-09-10 MED FILL — Potassium Chloride Inj 2 mEq/ML: INTRAVENOUS | Qty: 40 | Status: AC

## 2018-09-10 MED FILL — Heparin Sodium (Porcine) Inj 1000 Unit/ML: INTRAMUSCULAR | Qty: 30 | Status: AC

## 2018-09-10 MED FILL — Magnesium Sulfate Inj 50%: INTRAMUSCULAR | Qty: 2 | Status: AC

## 2018-09-10 NOTE — Telephone Encounter (Signed)
Zoe [name] states she has had a deep, croupy cough for 2 days. The cough is mostly non-productive, and when she does cough anything up it is clear. She has been drinking hot tea and gargling with salt water and nothing has helped. She has no fever, but she is tired. She is concerned the cough will affect her TAVR (completed 11/12). Instructed her to continue drinking hot tea and salt gargles. Informed her she may try plain Mucinex to break up her secretions or try Coricidin or cough drops. She understands to call her PCP if fever develops. She has a TOC appointment with Nell Range 11/21.  She understands she will be called prior to appointment if Joellen Jersey has further recommendations.

## 2018-09-10 NOTE — Telephone Encounter (Signed)
New Message:    Pt said she just had a Valve put in on 09-03-18. She is concerned because she has a deep continuing cough, no fever.

## 2018-09-11 ENCOUNTER — Encounter: Payer: Self-pay | Admitting: Cardiology

## 2018-09-12 ENCOUNTER — Other Ambulatory Visit: Payer: Self-pay | Admitting: Physician Assistant

## 2018-09-12 ENCOUNTER — Ambulatory Visit (INDEPENDENT_AMBULATORY_CARE_PROVIDER_SITE_OTHER): Payer: Medicare Other | Admitting: Physician Assistant

## 2018-09-12 ENCOUNTER — Ambulatory Visit
Admission: RE | Admit: 2018-09-12 | Discharge: 2018-09-12 | Disposition: A | Discharge status: Home / Self Care | Payer: Medicare Other | Source: Ambulatory Visit | Attending: Physician Assistant | Admitting: Physician Assistant

## 2018-09-12 ENCOUNTER — Encounter: Payer: Self-pay | Admitting: Physician Assistant

## 2018-09-12 VITALS — BP 148/62 | HR 84 | Ht 64.0 in | Wt 180.0 lb

## 2018-09-12 DIAGNOSIS — R05 Cough: Secondary | ICD-10-CM

## 2018-09-12 DIAGNOSIS — R059 Cough, unspecified: Secondary | ICD-10-CM

## 2018-09-12 DIAGNOSIS — I10 Essential (primary) hypertension: Secondary | ICD-10-CM | POA: Diagnosis not present

## 2018-09-12 DIAGNOSIS — K219 Gastro-esophageal reflux disease without esophagitis: Secondary | ICD-10-CM | POA: Diagnosis not present

## 2018-09-12 DIAGNOSIS — E785 Hyperlipidemia, unspecified: Secondary | ICD-10-CM

## 2018-09-12 DIAGNOSIS — Z952 Presence of prosthetic heart valve: Secondary | ICD-10-CM | POA: Diagnosis not present

## 2018-09-12 MED ORDER — CLINDAMYCIN HCL 300 MG PO CAPS: ORAL_CAPSULE | ORAL | 4 refills | Status: DC

## 2018-09-12 MED ORDER — GUAIFENESIN-CODEINE 100-10 MG/5ML PO SOLN: 5.0000 mL | Freq: Three times a day (TID) | ORAL | 0 refills | Status: DC | PRN

## 2018-09-12 MED ORDER — DOXYCYCLINE HYCLATE 100 MG PO CAPS: 100.0000 mg | ORAL_CAPSULE | Freq: Two times a day (BID) | ORAL | 7 refills | Status: DC

## 2018-09-12 NOTE — Patient Instructions (Addendum)
Medication Instructions:  Your physician has recommended you make the following change in your medication:   TAKE Clindamycin 600 mg 1 hour before dental work Your cough syrup has been sent to your pharmacy. You may take 5 mL up to 3 times per day as needed for cough. It will likely make you sleepy.  If you need a refill on your cardiac medications before your next appointment, please call your pharmacy.   Lab work: None Ordered   Testing/Procedures: A chest x-ray takes a picture of the organs and structures inside the chest, including the heart, lungs, and blood vessels. This test can show several things, including, whether the heart is enlarges; whether fluid is building up in the lungs; and whether pacemaker / defibrillator leads are still in place. Go to [place] at [facility] for xray  Follow-Up: You have been cleared to resume driving  Your physician recommends that you keep your appointments for Dec. 12 at 11:30 for echo and appointment with Nell Range, PA

## 2018-09-26 ENCOUNTER — Encounter: Payer: Self-pay | Admitting: Thoracic Surgery (Cardiothoracic Vascular Surgery)

## 2018-10-01 NOTE — Progress Notes (Signed)
[facility]                                       Cardiology Office Note    Date:  10/04/2018   ID:  Zoe [name], DOB 01-Feb-1934, MRN [id number]  PCP:  Zoe Gravel, MD  Cardiologist:  Dr. Einar Gip / Dr. Angelena Form & Dr. Roxy Manns (TAVR)  CC: 1 month s/p TAVR   History of Present Illness:  Zoe [name] is a 84 y.o. female with a history of HTN, HLD, hypothyroidism, breast cancer s/p lumpectomy, carotid artery stenosis, and severe AS s/p TAVR (09/03/18) who presents to clinic for follow up.   Patient states that she was first noted to have a heart murmur on physical exam by her primary care physician several years ago. Echocardiogram revealed the presence of severe AS and she was referred to Dr. Johney [name] has been following her carefully ever since.Recent follow-up echocardiogram reportedly demonstrated the presence of severe aortic stenosis with normal left ventricular systolic function.Patient had developed exertional shortness of breath and fatigue. She subsequently underwent diagnostic cardiac catheterization which revealed normal coronary arteries.  The patient was evaluated by the multidisciplinary valve team and underwent successful TAVR with a 23 mm Edwards Sapien 3 THV via the TF approach on 09/03/18. Post operative echo showed normally functioning TAVR with mean gradient of 15 mm Hg and no PVL. She was discharged on Aspirin and plavix.   She has done well in follow up but developed a cough after discharge and was found to have pneumonia. She was treated with doxycycline.  Today she presents to clinic for follow up. No CP or SOB. No LE edema, orthopnea or PND. No dizziness or syncope. No blood in stool or urine. No palpitations. Still having some shortness of breath with exertion. Just walking from the parking lot into the office today caused some mild shortness of breath. She really has felt wiped out by the PNA.   Past  Medical History:  Diagnosis Date  . Arthritis    some - per patient  . Breast cancer ([place])    breast cancer / left   . Cataract    bilat   . GERD (gastroesophageal reflux disease)   . History of kidney stones   . Hyperlipidemia   . Hypertension   . Hypothyroidism   . Macular degeneration    Left  . S/P TAVR (transcatheter aortic valve replacement) 09/03/2018   23 mm Edwards Sapien 3 transcatheter heart valve placed via percutaneous right transfemoral approach   . Severe aortic stenosis   . Stress incontinence   . Thyroid disease   . Tinnitus     Past Surgical History:  Procedure Laterality Date  . ABDOMINAL HYSTERECTOMY  1970's  . BACK SURGERY    . BREAST LUMPECTOMY  12/1998   lumpectomy  . CARDIAC CATHETERIZATION    . EYE SURGERY     cataract surgery bilat   . INTRAOPERATIVE TRANSTHORACIC ECHOCARDIOGRAM N/A 09/03/2018   Procedure: INTRAOPERATIVE TRANSTHORACIC ECHOCARDIOGRAM;  Surgeon: Burnell Blanks, MD;  Location: [facility];  Service: Open Heart Surgery;  Laterality: N/A;  . LITHOTRIPSY    . Right total knee     2018 Dr. Alvan Dame  . RIGHT/LEFT HEART CATH AND CORONARY ANGIOGRAPHY N/A 08/06/2018   Procedure: RIGHT/LEFT HEART CATH AND CORONARY ANGIOGRAPHY;  Surgeon: Adrian Prows, MD;  Location:  [facility];  Service: Cardiovascular;  Laterality: N/A;  . THYROIDECTOMY, PARTIAL  1975  . TONSILLECTOMY     as a child - patient not sure of exact date  . TOTAL KNEE ARTHROPLASTY Left 03/13/2016   Procedure: TOTAL KNEE ARTHROPLASTY;  Surgeon: Paralee Cancel, MD;  Location: [facility];  Service: Orthopedics;  Laterality: Left;  . TOTAL KNEE ARTHROPLASTY Right 06/18/2017   Procedure: RIGHT TOTAL KNEE ARTHROPLASTY;  Surgeon: Paralee Cancel, MD;  Location: [facility];  Service: Orthopedics;  Laterality: Right;  . TRANSCATHETER AORTIC VALVE REPLACEMENT, TRANSFEMORAL N/A 09/03/2018   Procedure: TRANSCATHETER AORTIC VALVE REPLACEMENT, TRANSFEMORAL;  Surgeon: Burnell Blanks, MD;   Location: [facility];  Service: Open Heart Surgery;  Laterality: N/A;    Current Medications: Outpatient Medications Prior to Visit  Medication Sig Dispense Refill  . Artificial Tear Solution (SOOTHE XP) SOLN Place 1 drop into both eyes every evening.    Marland Kitchen aspirin 81 MG chewable tablet Chew 1 tablet (81 mg total) by mouth daily.    . carvedilol (COREG) 12.5 MG tablet Take 12.5 mg by mouth 2 (two) times daily with a meal.    . Cholecalciferol (VITAMIN D3) 2000 units TABS Take 2,000 Units by mouth daily.    . clindamycin (CLEOCIN) 300 MG capsule Take 2 tabs (600 mg) 1 hour before dental work 2 capsule 4  . clopidogrel (PLAVIX) 75 MG tablet Take 1 tablet (75 mg total) by mouth daily with breakfast. 90 tablet 1  . hydrochlorothiazide (HYDRODIURIL) 25 MG tablet Take 25 mg by mouth daily.  4  . levothyroxine (SYNTHROID, LEVOTHROID) 100 MCG tablet Take 100 mcg by mouth daily before breakfast.  2  . losartan (COZAAR) 25 MG tablet Take 50 mg by mouth daily.     . Multiple Vitamins-Minerals (PRESERVISION AREDS 2) CAPS Take 1 tablet by mouth 2 (two) times daily.    . pantoprazole (PROTONIX) 40 MG tablet Take 1 tablet (40 mg total) by mouth daily. 90 tablet 1  . pravastatin (PRAVACHOL) 40 MG tablet Take 40 mg by mouth every evening.    Marland Kitchen doxycycline (VIBRAMYCIN) 100 MG capsule Take 1 capsule (100 mg total) by mouth 2 (two) times daily. (Patient not taking: Reported on 10/03/2018) 14 capsule 7  . guaiFENesin-codeine 100-10 MG/5ML syrup Take 5 mLs by mouth 3 (three) times daily as needed for cough. (Patient not taking: Reported on 10/03/2018) 120 mL 0   No facility-administered medications prior to visit.      Allergies:   Penicillins and Sulfa antibiotics   Social History   Socioeconomic History  . Marital status: Married    Spouse name: Not on file  . Number of children: 4  . Years of education: Not on file  . Highest education level: Not on file  Occupational History  . 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 Financial resource strain: Not on file  . Food insecurity:    Worry: Not on file    Inability: Not on file  . Transportation needs:    Medical: Not on file    Non-medical: Not on file  Tobacco Use  . Smoking status: Never Smoker  . Smokeless tobacco: Never Used  Substance and Sexual Activity  . Alcohol use: No  . Drug use: No  . Sexual activity: Not Currently  Lifestyle  . Physical activity:    Days per week: Not on file    Minutes per session: Not on file  .  Stress: Not on file  Relationships  . Social connections:    Talks on phone: Not on file    Gets together: Not on file    Attends religious service: Not on file    Active member of club or organization: Not on file    Attends meetings of clubs or organizations: Not on file    Relationship status: Not on file  Other Topics Concern  . Not on file  Social History Narrative  . Not on file     Family History:  The patient's family history includes Diabetes in her mother; Heart disease in her father; Stroke in her mother.      ROS:   Please see the history of present illness.    ROS All other systems reviewed and are negative.   PHYSICAL EXAM:   VS:  BP (!) 162/62   Pulse 63   Ht 5\' 4"  (1.626 m)   Wt 175 lb (79.4 kg)   SpO2 97%   BMI 30.04 kg/m    GEN: Well nourished, well developed, in no acute distress HEENT: normal Neck: no JVD or masses Cardiac: RRR; 2/6 SEM. No rubs, or gallops,no edema  Respiratory:  clear to auscultation bilaterally, normal work of breathing GI: soft, nontender, nondistended, + BS MS: no deformity or atrophy Skin: warm and dry, no rash Neuro:  Alert and Oriented x 3, Strength and sensation are intact Psych: euthymic mood, full affect   Wt Readings from Last 3 Encounters:  10/03/18 175 lb (79.4 kg)  09/12/18 180 lb (81.6 kg)  09/04/18 177 lb 11.2 oz (80.6 kg)      Studies/Labs Reviewed:   EKG:  EKG is NOT ordered  today.   Recent Labs: 08/30/2018: ALT 16; B Natriuretic Peptide 165.7 09/04/2018: BUN 18; Creatinine, Ser 0.75; Hemoglobin 9.5; Magnesium 1.5; Platelets 149; Potassium 4.1; Sodium 132   Lipid Panel No results found for: CHOL, TRIG, HDL, CHOLHDL, VLDL, LDLCALC, LDLDIRECT  Additional studies/ records that were reviewed today include:  TAVR OPERATIVE NOTE   Date of Procedure:09/03/2018  Preoperative Diagnosis:Severe Aortic Stenosis   Postoperative Diagnosis:Same   Procedure:   Transcatheter Aortic Valve Replacement - PercutaneousRightTransfemoral Approach Edwards Sapien 3 THV (size 62mm, model # 9600TFX, serial # [id number])  Co-Surgeons:Clarence H. Roxy Manns, MD and Lauree Chandler, MD  Pre-operative Echo Findings: ? Severe aortic stenosis ? Normalleft ventricular systolic function  Post-operative Echo Findings: ? Noparavalvular leak ? Normalleft ventricular systolic function   _____________  Echo 09/04/18 Study Conclusions - Left ventricle: The cavity size was normal. Wall thickness was   increased in a pattern of moderate LVH. Systolic function was   vigorous. The estimated ejection fraction was in the range of 65%   to 70%. Wall motion was normal; there were no regional wall   motion abnormalities. - Aortic valve: s/p TAVR 09/03/2018 Percell Miller Sapien 3, 23 mm). The   prosthesis had a normal range of motion. The sewing ring appeared   normal, had no rocking motion, and showed no evidence of   dehiscence. Mean gradient (S): 15 mm Hg. No prosthetic or   periprosthetic regurgitation. Valve area (VTI): 1.45 cm^2. Valve   area (Vmax): 1.43 cm^2. - Mitral valve: Calcified annulus. There was trivial regurgitation.   Mean gradient (D): 4 mm Hg (HR 78 BPM). - Left atrium: The atrium was mildly dilated. - Right ventricle: The cavity size was normal. Wall thickness was   normal. Systolic  function was normal. - Right atrium: The atrium was normal  in size. Central venous   pressure (est): 8 mm Hg. - Tricuspid valve: There was trivial regurgitation. - Inferior vena cava: The vessel was normal in size. The   respirophasic diameter changes were blunted (< 50%). - Pericardium, extracardiac: There was no pericardial effusion. - Compared to the immediate post-procedure TTE, the mean gradient   in systole of the aortic prosthesis has increased slightly. No   other significant changes on side by side comparison of images.  ______________  Echo 10/03/18 Study Conclusions - Left ventricle: The cavity size was normal. Wall thickness was   increased in a pattern of moderate LVH. Systolic function was   vigorous. The estimated ejection fraction was in the range of 65%   to 70%. Wall motion was normal; there were no regional wall   motion abnormalities. Features are consistent with a pseudonormal   left ventricular filling pattern, with concomitant abnormal   relaxation and increased filling pressure (grade 2 diastolic   dysfunction). - Aortic valve: A bioprosthesis was present. - Mitral valve: Mildly to moderately calcified annulus. Mildly   thickened leaflets .  Aortic valve:  The AV gradient is appropriate for this TAVR . A bioprosthesis was present.  Doppler:   There was no stenosis. VTI ratio of LVOT to aortic valve: 0.47. Valve area (VTI): 1.32 cm^2. Indexed valve area (VTI): 0.68 cm^2/m^2. Peak velocity ratio of LVOT to aortic valve: 0.37. Valve area (Vmax): 1.06 cm^2. Indexed valve area (Vmax): 0.54 cm^2/m^2. Mean velocity ratio of LVOT to aortic valve: 0.45. Valve area (Vmean): 1.28 cm^2. Indexed valve area (Vmean): 0.66 cm^2/m^2.    Mean gradient (S): 15 mm Hg. Peak gradient (S): 32 mm Hg.  ASSESSMENT & PLAN:     Severe AS s/p TAVR: echo today shows EF 65% with normally functioning TAVR with mean gradient of 15 mm Hg and no PVL. She has NYHA class II symptoms, but  thinks fatigue related to recent pnemonia. SBE prophylaxis discussed; she has clindamycin. Plavix can be discontinued after 6 months of therapy (02/2019)  HTN: Bp is elevated today. It remained 160/70 on my personal recheck. She has an appointment with Dr. Einar Gip later this afternoon and would like to have it checked again there. I will defer medication adjustments to him .  HLD: continue statin   GERD: continue PPI   Medication Adjustments/Labs and Tests Ordered: Current medicines are reviewed at length with the patient today.  Concerns regarding medicines are outlined above.  Medication changes, Labs and Tests ordered today are listed in the Patient Instructions below. Patient Instructions  Medication Instructions:  You may STOP PLAVIX Mar 04, 2019 (unless instructed otherwise between now and then)  Labwork: None  Testing/Procedures: Your provider has requested that you have an echocardiogram in 1 year. Echocardiography is a painless test that uses sound waves to create images of your heart. It provides your doctor with information about the size and shape of your heart and how well your heart's chambers and valves are working. This procedure takes approximately one hour. There are no restrictions for this procedure.  Follow-Up: You will be called to arrange your 1 year TAVR echo and office visit with Nell Range.  Any Other Special Instructions Will Be Listed Below (If Applicable). Don't forget to discuss your blood pressure with Dr. Einar Gip this afternoon! It was 162/62 for [place] today.    Signed, Angelena Form, PA-C  10/04/2018 9:08 AM    [place], [place]  [address] Phone: [phone]; Fax: [phone]

## 2018-10-03 ENCOUNTER — Other Ambulatory Visit: Payer: Self-pay

## 2018-10-03 ENCOUNTER — Ambulatory Visit (HOSPITAL_COMMUNITY): Payer: Medicare Other | Attending: Cardiovascular Disease

## 2018-10-03 ENCOUNTER — Other Ambulatory Visit: Payer: Self-pay | Admitting: Physician Assistant

## 2018-10-03 ENCOUNTER — Ambulatory Visit (INDEPENDENT_AMBULATORY_CARE_PROVIDER_SITE_OTHER): Payer: Medicare Other | Admitting: Physician Assistant

## 2018-10-03 ENCOUNTER — Encounter: Payer: Self-pay | Admitting: Physician Assistant

## 2018-10-03 VITALS — BP 162/62 | HR 63 | Ht 64.0 in | Wt 175.0 lb

## 2018-10-03 DIAGNOSIS — I10 Essential (primary) hypertension: Secondary | ICD-10-CM | POA: Diagnosis not present

## 2018-10-03 DIAGNOSIS — Z953 Presence of xenogenic heart valve: Secondary | ICD-10-CM | POA: Diagnosis not present

## 2018-10-03 DIAGNOSIS — E785 Hyperlipidemia, unspecified: Secondary | ICD-10-CM | POA: Diagnosis not present

## 2018-10-03 DIAGNOSIS — K219 Gastro-esophageal reflux disease without esophagitis: Secondary | ICD-10-CM | POA: Diagnosis not present

## 2018-10-03 DIAGNOSIS — Z952 Presence of prosthetic heart valve: Secondary | ICD-10-CM

## 2018-10-03 DIAGNOSIS — Z0189 Encounter for other specified special examinations: Secondary | ICD-10-CM | POA: Diagnosis not present

## 2018-10-03 DIAGNOSIS — R0609 Other forms of dyspnea: Secondary | ICD-10-CM | POA: Diagnosis not present

## 2018-10-03 NOTE — Patient Instructions (Signed)
Medication Instructions:  You may STOP PLAVIX Mar 04, 2019 (unless instructed otherwise between now and then)  Labwork: None  Testing/Procedures: Your provider has requested that you have an echocardiogram in 1 year. Echocardiography is a painless test that uses sound waves to create images of your heart. It provides your doctor with information about the size and shape of your heart and how well your heart's chambers and valves are working. This procedure takes approximately one hour. There are no restrictions for this procedure.  Follow-Up: You will be called to arrange your 1 year TAVR echo and office visit with Nell Range.  Any Other Special Instructions Will Be Listed Below (If Applicable). Don't forget to discuss your blood pressure with Dr. Einar Gip this afternoon! It was 162/62 for [place] today.

## 2018-10-08 ENCOUNTER — Telehealth (HOSPITAL_COMMUNITY): Payer: Self-pay

## 2018-10-08 NOTE — Telephone Encounter (Signed)
Called patient to see if she was interested in participating in the Cardiac Rehab Program. Patient stated yes. Patient will come in for orientation on 11/12/18 @ 8AM and will attend the 945AM exercise class.  Mailed homework package.

## 2018-10-29 DIAGNOSIS — L03012 Cellulitis of left finger: Secondary | ICD-10-CM | POA: Diagnosis not present

## 2018-11-04 ENCOUNTER — Telehealth (HOSPITAL_COMMUNITY): Payer: Self-pay | Admitting: Pharmacist

## 2018-11-04 NOTE — Telephone Encounter (Signed)
Cardiac Rehab Medication Review by a Pharmacist  Does the patient  feel that his/her medications are working for him/her?  Yes - however mentions she still feels tired/fatigue since after her aortic heart valve surgery  Has the patient been experiencing any side effects to the medications prescribed?  No   Does the patient measure his/her own blood pressure or blood glucose at home?  no   Does the patient have any problems obtaining medications due to transportation or finances?   no  Understanding of regimen: good Understanding of indications: good Potential of compliance: good    Gwenlyn Found, Sherian Rein D PGY1 Pharmacy Resident  Phone [phone] 11/04/2018   3:05 PM

## 2018-11-06 DIAGNOSIS — Z298 Encounter for other specified prophylactic measures: Secondary | ICD-10-CM | POA: Diagnosis not present

## 2018-11-06 DIAGNOSIS — I10 Essential (primary) hypertension: Secondary | ICD-10-CM | POA: Diagnosis not present

## 2018-11-06 DIAGNOSIS — Z953 Presence of xenogenic heart valve: Secondary | ICD-10-CM | POA: Diagnosis not present

## 2018-11-06 DIAGNOSIS — R0609 Other forms of dyspnea: Secondary | ICD-10-CM | POA: Diagnosis not present

## 2018-11-07 ENCOUNTER — Other Ambulatory Visit: Payer: Self-pay | Admitting: Cardiology

## 2018-11-07 ENCOUNTER — Ambulatory Visit
Admission: RE | Admit: 2018-11-07 | Discharge: 2018-11-07 | Disposition: A | Discharge status: Home / Self Care | Payer: Medicare Other | Source: Ambulatory Visit | Attending: Cardiology | Admitting: Cardiology

## 2018-11-07 DIAGNOSIS — R0602 Shortness of breath: Secondary | ICD-10-CM

## 2018-11-07 NOTE — Progress Notes (Signed)
[name] 85 y.o. female DOB: 08/15/1934 MRN: [id number]      Nutrition Note Screen  No diagnosis found. Past Medical History:  Diagnosis Date  . Arthritis    some - per patient  . Breast cancer ([place])    breast cancer / left   . Cataract    bilat   . GERD (gastroesophageal reflux disease)   . History of kidney stones   . Hyperlipidemia   . Hypertension   . Hypothyroidism   . Macular degeneration    Left  . S/P TAVR (transcatheter aortic valve replacement) 09/03/2018   23 mm Edwards Sapien 3 transcatheter heart valve placed via percutaneous right transfemoral approach   . Severe aortic stenosis   . Stress incontinence   . Thyroid disease   . Tinnitus    Meds reviewed.   Current Outpatient Medications (Endocrine & Metabolic):  .  levothyroxine (SYNTHROID, LEVOTHROID) 100 MCG tablet, Take 100 mcg by mouth daily before breakfast.  Current Outpatient Medications (Cardiovascular):  .  carvedilol (COREG) 12.5 MG tablet, Take 12.5 mg by mouth 2 (two) times daily with a meal. .  hydrochlorothiazide (HYDRODIURIL) 25 MG tablet, Take 25 mg by mouth daily. Marland Kitchen  losartan (COZAAR) 25 MG tablet, Take 50 mg by mouth 2 (two) times daily.  .  pravastatin (PRAVACHOL) 40 MG tablet, Take 40 mg by mouth every evening.   Current Outpatient Medications (Analgesics):  .  aspirin 81 MG chewable tablet, Chew 1 tablet (81 mg total) by mouth daily.  Current Outpatient Medications (Hematological):  .  clopidogrel (PLAVIX) 75 MG tablet, Take 1 tablet (75 mg total) by mouth daily with breakfast.  Current Outpatient Medications (Other):  Marland Kitchen  Artificial Tear Solution (SOOTHE XP) SOLN, Place 1 drop into both eyes every evening. .  Cholecalciferol (VITAMIN D3) 2000 units TABS, Take 2,000 Units by mouth daily. .  clindamycin (CLEOCIN) 300 MG capsule, Take 2 tabs (600 mg) 1 hour before dental work .  Multiple Vitamins-Minerals (PRESERVISION AREDS 2) CAPS, Take 1 tablet by mouth 2 (two) times daily. .   pantoprazole (PROTONIX) 40 MG tablet, Take 1 tablet (40 mg total) by mouth daily.   HT: Ht Readings from Last 1 Encounters:  10/03/18 5\' 4"  (1.626 m)    WT: Wt Readings from Last 5 Encounters:  10/03/18 175 lb (79.4 kg)  09/12/18 180 lb (81.6 kg)  09/04/18 177 lb 11.2 oz (80.6 kg)  08/30/18 174 lb 12.8 oz (79.3 kg)  08/22/18 177 lb (80.3 kg)     BMI = 30.02   10/03/18  Current tobacco use? No  Labs:  Lipid Panel  No results found for: CHOL, TRIG, HDL, CHOLHDL, VLDL, LDLCALC, LDLDIRECT  Lab Results  Component Value Date   HGBA1C 5.5 08/30/2018   CBG (last 3)  No results for input(s): GLUCAP in the last 72 hours.  Nutrition Diagnosis ? Food-and nutrition-related knowledge deficit related to lack of exposure to information as related to diagnosis of: ? CVD ? Obese  I = 30-34.9 related to excessive energy intake as evidenced by a BMI = 30.02   (10/03/18)  Nutrition Goal(s):   To be determined  Plan:  ? Pt to attend nutrition classes ? Nutrition I ? Nutrition II ? Portion Distortion  ? Will provide client-centered nutrition education as part of interdisciplinary care ? Monitor and evaluate progress toward nutrition goal with team.   Laurina Bustle, MS, RD, LDN 11/07/2018 8:01 PM

## 2018-11-08 ENCOUNTER — Encounter: Payer: Self-pay | Admitting: Thoracic Surgery (Cardiothoracic Vascular Surgery)

## 2018-11-12 ENCOUNTER — Encounter (HOSPITAL_COMMUNITY): Payer: Self-pay

## 2018-11-12 ENCOUNTER — Encounter (HOSPITAL_COMMUNITY)
Admission: RE | Admit: 2018-11-12 | Discharge: 2018-11-12 | Disposition: A | Discharge status: Home / Self Care | Payer: Medicare Other | Source: Ambulatory Visit | Attending: Cardiology | Admitting: Cardiology

## 2018-11-12 VITALS — BP 124/70 | HR 78 | Ht 64.0 in | Wt 178.6 lb

## 2018-11-12 DIAGNOSIS — Z952 Presence of prosthetic heart valve: Secondary | ICD-10-CM | POA: Insufficient documentation

## 2018-11-12 NOTE — Progress Notes (Signed)
Cardiac Individual Treatment Plan  Patient Details  Name: Zoe [name] MRN: [id number] Date of Birth: 14-Apr-1934 Referring Provider:     CARDIAC REHAB PHASE II ORIENTATION from 11/12/2018 in [facility]  Referring Provider  Adrian Prows, MD      Initial Encounter Date:    CARDIAC REHAB PHASE II ORIENTATION from 11/12/2018 in [facility]  Date  11/12/18      Visit Diagnosis: 09/03/2018 S/P TAVR (transcatheter aortic valve replacement)  Patient's Home Medications on Admission:  Current Outpatient Medications:  .  Artificial Tear Solution (SOOTHE XP) SOLN, Place 1 drop into both eyes every evening., Disp: , Rfl:  .  aspirin 81 MG chewable tablet, Chew 1 tablet (81 mg total) by mouth daily., Disp: , Rfl:  .  carvedilol (COREG) 12.5 MG tablet, Take 12.5 mg by mouth 2 (two) times daily with a meal., Disp: , Rfl:  .  Cholecalciferol (VITAMIN D3) 2000 units TABS, Take 2,000 Units by mouth daily., Disp: , Rfl:  .  clindamycin (CLEOCIN) 300 MG capsule, Take 2 tabs (600 mg) 1 hour before dental work, Disp: 2 capsule, Rfl: 4 .  clopidogrel (PLAVIX) 75 MG tablet, Take 1 tablet (75 mg total) by mouth daily with breakfast., Disp: 90 tablet, Rfl: 1 .  hydrochlorothiazide (HYDRODIURIL) 25 MG tablet, Take 25 mg by mouth daily., Disp: , Rfl: 4 .  levothyroxine (SYNTHROID, LEVOTHROID) 100 MCG tablet, Take 100 mcg by mouth daily before breakfast., Disp: , Rfl: 2 .  losartan (COZAAR) 25 MG tablet, Take 50 mg by mouth 2 (two) times daily. , Disp: , Rfl:  .  Multiple Vitamins-Minerals (PRESERVISION AREDS 2) CAPS, Take 1 tablet by mouth 2 (two) times daily., Disp: , Rfl:  .  pantoprazole (PROTONIX) 40 MG tablet, Take 1 tablet (40 mg total) by mouth daily., Disp: 90 tablet, Rfl: 1 .  pravastatin (PRAVACHOL) 40 MG tablet, Take 40 mg by mouth every evening., Disp: , Rfl:   Past Medical History: Past Medical History:  Diagnosis Date  . Arthritis     some - per patient  . Breast cancer ([place])    breast cancer / left   . Cataract    bilat   . GERD (gastroesophageal reflux disease)   . History of kidney stones   . Hyperlipidemia   . Hypertension   . Hypothyroidism   . Macular degeneration    Left  . S/P TAVR (transcatheter aortic valve replacement) 09/03/2018   23 mm Edwards Sapien 3 transcatheter heart valve placed via percutaneous right transfemoral approach   . Severe aortic stenosis   . Stress incontinence   . Thyroid disease   . Tinnitus     Tobacco Use: Social History   Tobacco Use  Smoking Status Never Smoker  Smokeless Tobacco Never Used    Labs: Recent Review Flowsheet Data    Labs for ITP Cardiac and Pulmonary Rehab Latest Ref Rng & Units 08/06/2018 08/30/2018 09/03/2018 09/03/2018 09/03/2018   Hemoglobin A1c 4.8 - 5.6 % - 5.5 - - -   PHART 7.350 - 7.450 7.361 7.436 - - -   PCO2ART 32.0 - 48.0 mmHg 47.8 37.7 - - -   HCO3 20.0 - 28.0 mmol/L 27.1 25.0 - - -   TCO2 22 - 32 mmol/L 29 - 26 30 30    O2SAT % 95.0 96.9 - - -      Capillary Blood Glucose: No results found for: GLUCAP   Exercise Target  Goals: Exercise Program Goal: Individual exercise prescription set using results from initial 6 min walk test and THRR while considering  patient's activity barriers and safety.   Exercise Prescription Goal: Initial exercise prescription builds to 30-45 minutes a day of aerobic activity, 2-3 days per week.  Home exercise guidelines will be given to patient during program as part of exercise prescription that the participant will acknowledge.  Activity Barriers & Risk Stratification: Activity Barriers & Cardiac Risk Stratification - 11/12/18 0916      Activity Barriers & Cardiac Risk Stratification   Activity Barriers  Left Knee Replacement;Right Knee Replacement;Arthritis    Cardiac Risk Stratification  High       6 Minute Walk: 6 Minute Walk    Row Name 11/12/18 0841         6 Minute Walk   Phase   Initial     Distance  1074 feet     Walk Time  6 minutes     # of Rest Breaks  1     MPH  2.03     METS  1.84     RPE  13     Perceived Dyspnea   3     Symptoms  Yes (comment)     Comments  SOB. Rest break taken from minute 2.95 to 3.67, rollator given at that time.     Resting HR  78 bpm     Resting BP  124/70     Resting Oxygen Saturation   97 %     Exercise Oxygen Saturation  during 6 min walk  97 %     Max Ex. HR  112 bpm     Max Ex. BP  142/74     2 Minute Post BP  132/72        Oxygen Initial Assessment:   Oxygen Re-Evaluation:   Oxygen Discharge (Final Oxygen Re-Evaluation):   Initial Exercise Prescription: Initial Exercise Prescription - 11/12/18 1100      Date of Initial Exercise RX and Referring Provider   Date  11/12/18    Referring Provider  Adrian Prows, MD    Expected Discharge Date  02/17/19      Recumbant Bike   Level  1    Minutes  10    METs  2.2      NuStep   Level  1    SPM  85    Minutes  10    METs  2.2      Track   Laps  8    Minutes  10    METs  2.39      Prescription Details   Frequency (times per week)  3    Duration  Progress to 30 minutes of continuous aerobic without signs/symptoms of physical distress      Intensity   THRR 40-80% of Max Heartrate  55-110    Ratings of Perceived Exertion  11-13    Perceived Dyspnea  0-4      Progression   Progression  Continue to progress workloads to maintain intensity without signs/symptoms of physical distress.      Resistance Training   Training Prescription  Yes    Weight  2lbs    Reps  10-15       Perform Capillary Blood Glucose checks as needed.  Exercise Prescription Changes:   Exercise Comments:   Exercise Goals and Review: Exercise Goals    Row Name 11/12/18 [phone]  Exercise Goals   Increase Physical Activity  Yes       Intervention  Provide advice, education, support and counseling about physical activity/exercise needs.;Develop an individualized  exercise prescription for aerobic and resistive training based on initial evaluation findings, risk stratification, comorbidities and participant's personal goals.       Expected Outcomes  Short Term: Attend rehab on a regular basis to increase amount of physical activity.;Long Term: Exercising regularly at least 3-5 days a week.;Long Term: Add in home exercise to make exercise part of routine and to increase amount of physical activity.       Increase Strength and Stamina  Yes       Intervention  Provide advice, education, support and counseling about physical activity/exercise needs.;Develop an individualized exercise prescription for aerobic and resistive training based on initial evaluation findings, risk stratification, comorbidities and participant's personal goals.       Expected Outcomes  Short Term: Increase workloads from initial exercise prescription for resistance, speed, and METs.;Short Term: Perform resistance training exercises routinely during rehab and add in resistance training at home;Long Term: Improve cardiorespiratory fitness, muscular endurance and strength as measured by increased METs and functional capacity (6MWT)       Able to understand and use rate of perceived exertion (RPE) scale  Yes       Intervention  Provide education and explanation on how to use RPE scale       Expected Outcomes  Short Term: Able to use RPE daily in rehab to express subjective intensity level;Long Term:  Able to use RPE to guide intensity level when exercising independently       Knowledge and understanding of Target Heart Rate Range (THRR)  Yes       Intervention  Provide education and explanation of THRR including how the numbers were predicted and where they are located for reference       Expected Outcomes  Short Term: Able to state/look up THRR;Long Term: Able to use THRR to govern intensity when exercising independently;Short Term: Able to use daily as guideline for intensity in rehab       Able  to check pulse independently  Yes       Intervention  Provide education and demonstration on how to check pulse in carotid and radial arteries.;Review the importance of being able to check your own pulse for safety during independent exercise       Expected Outcomes  Short Term: Able to explain why pulse checking is important during independent exercise;Long Term: Able to check pulse independently and accurately       Understanding of Exercise Prescription  Yes       Intervention  Provide education, explanation, and written materials on patient's individual exercise prescription       Expected Outcomes  Short Term: Able to explain program exercise prescription;Long Term: Able to explain home exercise prescription to exercise independently          Exercise Goals Re-Evaluation :   Discharge Exercise Prescription (Final Exercise Prescription Changes):   Nutrition:  Target Goals: Understanding of nutrition guidelines, daily intake of sodium 1500mg , cholesterol 200mg , calories 30% from fat and 7% or less from saturated fats, daily to have 5 or more servings of fruits and vegetables.  Biometrics: Pre Biometrics - 11/12/18 0807      Pre Biometrics   Height  5\' 4"  (1.626 m)    Weight  81 kg    Waist Circumference  38 inches    Hip  Circumference  44 inches    Waist to Hip Ratio  0.86 %    BMI (Calculated)  30.64    Triceps Skinfold  42 mm    % Body Fat  44.8 %    Grip Strength  22.5 kg    Flexibility  8 in    Single Leg Stand  6.68 seconds        Nutrition Therapy Plan and Nutrition Goals:   Nutrition Assessments:   Nutrition Goals Re-Evaluation:   Nutrition Goals Re-Evaluation:   Nutrition Goals Discharge (Final Nutrition Goals Re-Evaluation):   Psychosocial: Target Goals: Acknowledge presence or absence of significant depression and/or stress, maximize coping skills, provide positive support system. Participant is able to verbalize types and ability to use techniques  and skills needed for reducing stress and depression.  Initial Review & Psychosocial Screening: Initial Psych Review & Screening - 11/12/18 1243      Initial Review   Current issues with  Current Stress Concerns    Source of Stress Concerns  Family    Comments  Has family stressors      Family Dynamics   Good Support System?  Yes   Donia Guiles has her husband children and friends for support   [place]  recently loss son in law to lung cancer      Barriers   Psychosocial barriers to participate in program  The patient should benefit from training in stress management and relaxation.      Screening Interventions   Interventions  Encouraged to exercise;To provide support and resources with identified psychosocial needs    Expected Outcomes  Short Term goal: Utilizing psychosocial counselor, staff and physician to assist with identification of specific Stressors or current issues interfering with healing process. Setting desired goal for each stressor or current issue identified.;Long Term Goal: Stressors or current issues are controlled or eliminated.;Short Term goal: Identification and review with participant of any Quality of Life or Depression concerns found by scoring the questionnaire.       Quality of Life Scores: Quality of Life - 11/12/18 1148      Quality of Life   Select  Quality of Life      Quality of Life Scores   Health/Function Pre  18.43 %    Socioeconomic Pre  27.36 %    Psych/Spiritual Pre  26.57 %    Family Pre  23.4 %    GLOBAL Pre  22.8 %      Scores of 19 and below usually indicate a poorer quality of life in these areas.  A difference of  2-3 points is a clinically meaningful difference.  A difference of 2-3 points in the total score of the Quality of Life Index has been associated with significant improvement in overall quality of life, self-image, physical symptoms, and general health in studies assessing change in quality of life.  PHQ-9: Recent  Review Flowsheet Data    There is no flowsheet data to display.     Interpretation of Total Score  Total Score Depression Severity:  1-4 = Minimal depression, 5-9 = Mild depression, 10-14 = Moderate depression, 15-19 = Moderately severe depression, 20-27 = Severe depression   Psychosocial Evaluation and Intervention:   Psychosocial Re-Evaluation:   Psychosocial Discharge (Final Psychosocial Re-Evaluation):   Vocational Rehabilitation: Provide vocational rehab assistance to qualifying candidates.   Vocational Rehab Evaluation & Intervention: Vocational Rehab - 11/12/18 1249      Initial Vocational Rehab Evaluation & Intervention   Assessment  shows need for Vocational Rehabilitation  No   Donia Guiles is retired and does not need voational rehab at this time      Education: Education Goals: Education classes will be provided on a weekly basis, covering required topics. Participant will state understanding/return demonstration of topics presented.  Learning Barriers/Preferences: Learning Barriers/Preferences - 11/12/18 1247      Learning Barriers/Preferences   Learning Barriers  Sight   Wears glasses   Learning Preferences  Written Material;Pictoral;Video       Education Topics: Count Your Pulse:  -Group instruction provided by verbal instruction, demonstration, patient participation and written materials to support subject.  Instructors address importance of being able to find your pulse and how to count your pulse when at home without a heart monitor.  Patients get hands on experience counting their pulse with staff help and individually.   Heart Attack, Angina, and Risk Factor Modification:  -Group instruction provided by verbal instruction, video, and written materials to support subject.  Instructors address signs and symptoms of angina and heart attacks.    Also discuss risk factors for heart disease and how to make changes to improve heart health risk  factors.   Functional Fitness:  -Group instruction provided by verbal instruction, demonstration, patient participation, and written materials to support subject.  Instructors address safety measures for doing things around the house.  Discuss how to get up and down off the floor, how to pick things up properly, how to safely get out of a chair without assistance, and balance training.   Meditation and Mindfulness:  -Group instruction provided by verbal instruction, patient participation, and written materials to support subject.  Instructor addresses importance of mindfulness and meditation practice to help reduce stress and improve awareness.  Instructor also leads participants through a meditation exercise.    Stretching for Flexibility and Mobility:  -Group instruction provided by verbal instruction, patient participation, and written materials to support subject.  Instructors lead participants through series of stretches that are designed to increase flexibility thus improving mobility.  These stretches are additional exercise for major muscle groups that are typically performed during regular warm up and cool down.   Hands Only CPR:  -Group verbal, video, and participation provides a basic overview of AHA guidelines for community CPR. Role-play of emergencies allow participants the opportunity to practice calling for help and chest compression technique with discussion of AED use.   Hypertension: -Group verbal and written instruction that provides a basic overview of hypertension including the most recent diagnostic guidelines, risk factor reduction with self-care instructions and medication management.    Nutrition I class: Heart Healthy Eating:  -Group instruction provided by PowerPoint slides, verbal discussion, and written materials to support subject matter. The instructor gives an explanation and review of the Therapeutic Lifestyle Changes diet recommendations, which includes a  discussion on lipid goals, dietary fat, sodium, fiber, plant stanol/sterol esters, sugar, and the components of a well-balanced, healthy diet.   Nutrition II class: Lifestyle Skills:  -Group instruction provided by PowerPoint slides, verbal discussion, and written materials to support subject matter. The instructor gives an explanation and review of label reading, grocery shopping for heart health, heart healthy recipe modifications, and ways to make healthier choices when eating out.   Diabetes Question & Answer:  -Group instruction provided by PowerPoint slides, verbal discussion, and written materials to support subject matter. The instructor gives an explanation and review of diabetes co-morbidities, pre- and post-prandial blood glucose goals, pre-exercise blood glucose goals, signs, symptoms, and  treatment of hypoglycemia and hyperglycemia, and foot care basics.   Diabetes Blitz:  -Group instruction provided by PowerPoint slides, verbal discussion, and written materials to support subject matter. The instructor gives an explanation and review of the physiology behind type 1 and type 2 diabetes, diabetes medications and rational behind using different medications, pre- and post-prandial blood glucose recommendations and Hemoglobin A1c goals, diabetes diet, and exercise including blood glucose guidelines for exercising safely.    Portion Distortion:  -Group instruction provided by PowerPoint slides, verbal discussion, written materials, and food models to support subject matter. The instructor gives an explanation of serving size versus portion size, changes in portions sizes over the last 20 years, and what consists of a serving from each food group.   Stress Management:  -Group instruction provided by verbal instruction, video, and written materials to support subject matter.  Instructors review role of stress in heart disease and how to cope with stress positively.     Exercising on Your  Own:  -Group instruction provided by verbal instruction, power point, and written materials to support subject.  Instructors discuss benefits of exercise, components of exercise, frequency and intensity of exercise, and end points for exercise.  Also discuss use of nitroglycerin and activating EMS.  Review options of places to exercise outside of rehab.  Review guidelines for sex with heart disease.   Cardiac Drugs I:  -Group instruction provided by verbal instruction and written materials to support subject.  Instructor reviews cardiac drug classes: antiplatelets, anticoagulants, beta blockers, and statins.  Instructor discusses reasons, side effects, and lifestyle considerations for each drug class.   Cardiac Drugs II:  -Group instruction provided by verbal instruction and written materials to support subject.  Instructor reviews cardiac drug classes: angiotensin converting enzyme inhibitors (ACE-I), angiotensin II receptor blockers (ARBs), nitrates, and calcium channel blockers.  Instructor discusses reasons, side effects, and lifestyle considerations for each drug class.   Anatomy and Physiology of the Circulatory System:  Group verbal and written instruction and models provide basic cardiac anatomy and physiology, with the coronary electrical and arterial systems. Review of: AMI, Angina, Valve disease, Heart Failure, Peripheral Artery Disease, Cardiac Arrhythmia, Pacemakers, and the ICD.   Other Education:  -Group or individual verbal, written, or video instructions that support the educational goals of the cardiac rehab program.   Holiday Eating Survival Tips:  -Group instruction provided by PowerPoint slides, verbal discussion, and written materials to support subject matter. The instructor gives patients tips, tricks, and techniques to help them not only survive but enjoy the holidays despite the onslaught of food that accompanies the holidays.   Knowledge Questionnaire  Score: Knowledge Questionnaire Score - 11/12/18 1144      Knowledge Questionnaire Score   Pre Score  22/24       Core Components/Risk Factors/Patient Goals at Admission: Personal Goals and Risk Factors at Admission - 11/12/18 1151      Core Components/Risk Factors/Patient Goals on Admission    Weight Management  Yes;Obesity    Intervention  Weight Management: Develop a combined nutrition and exercise program designed to reach desired caloric intake, while maintaining appropriate intake of nutrient and fiber, sodium and fats, and appropriate energy expenditure required for the weight goal.;Weight Management: Provide education and appropriate resources to help participant work on and attain dietary goals.;Weight Management/Obesity: Establish reasonable short term and long term weight goals.;Obesity: Provide education and appropriate resources to help participant work on and attain dietary goals.    Admit Weight  178 lb  9.2 oz (81 kg)    Goal Weight: Short Term  172 lb (78 kg)    Goal Weight: Long Term  160 lb (72.6 kg)    Expected Outcomes  Short Term: Continue to assess and modify interventions until short term weight is achieved;Long Term: Adherence to nutrition and physical activity/exercise program aimed toward attainment of established weight goal;Weight Loss: Understanding of general recommendations for a balanced deficit meal plan, which promotes 1-2 lb weight loss per week and includes a negative energy balance of [phone] kcal/d;Understanding recommendations for meals to include 15-35% energy as protein, 25-35% energy from fat, 35-60% energy from carbohydrates, less than 200mg  of dietary cholesterol, 20-35 gm of total fiber daily;Understanding of distribution of calorie intake throughout the day with the consumption of 4-5 meals/snacks    Improve shortness of breath with ADL's  Yes    Intervention  Provide education, individualized exercise plan and daily activity instruction to help  decrease symptoms of SOB with activities of daily living.    Expected Outcomes  Short Term: Improve cardiorespiratory fitness to achieve a reduction of symptoms when performing ADLs;Long Term: Be able to perform more ADLs without symptoms or delay the onset of symptoms    Hypertension  Yes    Intervention  Provide education on lifestyle modifcations including regular physical activity/exercise, weight management, moderate sodium restriction and increased consumption of fresh fruit, vegetables, and low fat dairy, alcohol moderation, and smoking cessation.;Monitor prescription use compliance.    Expected Outcomes  Short Term: Continued assessment and intervention until BP is < 140/11mm HG in hypertensive participants. < 130/37mm HG in hypertensive participants with diabetes, heart failure or chronic kidney disease.;Long Term: Maintenance of blood pressure at goal levels.    Lipids  Yes    Intervention  Provide education and support for participant on nutrition & aerobic/resistive exercise along with prescribed medications to achieve LDL 70mg , HDL >40mg .    Expected Outcomes  Short Term: Participant states understanding of desired cholesterol values and is compliant with medications prescribed. Participant is following exercise prescription and nutrition guidelines.;Long Term: Cholesterol controlled with medications as prescribed, with individualized exercise RX and with personalized nutrition plan. Value goals: LDL < 70mg , HDL > 40 mg.    Stress  Yes    Intervention  Offer individual and/or small group education and counseling on adjustment to heart disease, stress management and health-related lifestyle change. Teach and support self-help strategies.;Refer participants experiencing significant psychosocial distress to appropriate mental health specialists for further evaluation and treatment. When possible, include family members and significant others in education/counseling sessions.    Expected Outcomes   Short Term: Participant demonstrates changes in health-related behavior, relaxation and other stress management skills, ability to obtain effective social support, and compliance with psychotropic medications if prescribed.;Long Term: Emotional wellbeing is indicated by absence of clinically significant psychosocial distress or social isolation.       Core Components/Risk Factors/Patient Goals Review:    Core Components/Risk Factors/Patient Goals at Discharge (Final Review):    ITP Comments: ITP Comments    Row Name 11/12/18 0844           ITP Comments  Dr. Fransico Him, Medical Director           Comments: Donia Guiles  attended orientation from [phone] to [phone] to review rules and guidelines for program. Completed 6 minute walk test, Intitial ITP, and exercise prescription.  VSS. Telemetry-Sinus Rhythm with intermittent Multifocal PVC's and PAC's. Ginny did report experiencing shortness of breath during her walk test  at and stopped to rest for 83 seconds and then completed her walk test using a rollator. Will fax today's ECG tracings over for Dr Einar Gip to review. Ginny left cardiac rehab without further complaints or symptoms. Barnet Pall, RN,BSN 11/12/2018 1:09 PM

## 2018-11-15 DIAGNOSIS — Z953 Presence of xenogenic heart valve: Secondary | ICD-10-CM | POA: Diagnosis not present

## 2018-11-15 DIAGNOSIS — R0609 Other forms of dyspnea: Secondary | ICD-10-CM | POA: Diagnosis not present

## 2018-11-18 ENCOUNTER — Encounter (HOSPITAL_COMMUNITY)
Admission: RE | Admit: 2018-11-18 | Discharge: 2018-11-18 | Disposition: A | Discharge status: Home / Self Care | Payer: Medicare Other | Source: Ambulatory Visit | Attending: Cardiology | Admitting: Cardiology

## 2018-11-18 DIAGNOSIS — Z952 Presence of prosthetic heart valve: Secondary | ICD-10-CM

## 2018-11-18 NOTE — Progress Notes (Signed)
Daily Session Note  Patient Details  Name: Zoe [name] MRN: [id number] Date of Birth: [birth year] Referring Provider:     CARDIAC REHAB PHASE II ORIENTATION from 11/12/2018 in [place]  Referring Provider  Adrian Prows, MD      Encounter Date: 11/18/2018  Check In: Session Check In - 11/18/18 1050      Check-In   Supervising physician immediately available to respond to emergencies  [facility] immediately available    Physician(s)  Dr. Ree Kida    Location  [facility]    Staff Present  Barnet Pall, RN, Mosie Epstein, MS,ACSM CEP, Exercise Physiologist;Olinty Celesta Aver, MS, ACSM CEP, Exercise Physiologist    Medication changes reported      No    Fall or balance concerns reported     No    Tobacco Cessation  No Change    Warm-up and Cool-down  Performed as group-led instruction    Resistance Training Performed  Yes    VAD Patient?  No    PAD/SET Patient?  No      Pain Assessment   Currently in Pain?  No/denies       Capillary Blood Glucose: No results found for this or any previous visit (from the past 24 hour(s)).    Social History   Tobacco Use  Smoking Status Never Smoker  Smokeless Tobacco Never Used    Goals Met:  No report of cardiac concerns or symptoms  Goals Unmet:  Not Applicable  Comments: Pt started cardiac rehab today.  Pt tolerated light exercise without difficulty. VSS, telemetry-Sinus Rhythm with frequent PAC's ,Ginny did experience some mild shortness of breath with exertion. Oxygen saturation noted at 97% Medication list reconciled. Pt denies barriers to medicaiton compliance.  PSYCHOSOCIAL ASSESSMENT:  PHQ-0. Pt exhibits positive coping skills, hopeful outlook with supportive family. No psychosocial needs identified at this time, no psychosocial interventions necessary.    Pt enjoys gardening reading and cooking .   Pt oriented to exercise equipment and routine.    Understanding  verbalized.Barnet Pall, RN,BSN 11/18/2018 11:48 AM   Dr. Fransico Him is Medical Director for Cardiac Rehab at [facility].

## 2018-11-20 ENCOUNTER — Encounter (HOSPITAL_COMMUNITY)
Admission: RE | Admit: 2018-11-20 | Discharge: 2018-11-20 | Disposition: A | Discharge status: Home / Self Care | Payer: Medicare Other | Source: Ambulatory Visit | Attending: Cardiology | Admitting: Cardiology

## 2018-11-20 ENCOUNTER — Encounter: Payer: Self-pay | Admitting: Cardiology

## 2018-11-20 DIAGNOSIS — Z952 Presence of prosthetic heart valve: Secondary | ICD-10-CM

## 2018-11-20 NOTE — Progress Notes (Signed)
Zoe [name] 85 y.o. female Nutrition Note Spoke with pt. Nutrition Plan and Nutrition Survey goals reviewed with pt. Pt is following a Heart Healthy diet (TLC step 1). Pt wants to lose wt. Pt has not been trying to lose wt. Heart healthy wt loss tips reviewed (label reading, how to build a healthy plate, portion sizes, weighing and measuring foods, eating frequently across the day). Last A1c indicates blood glucose elevated at 5.5, discussed the differences between complex and refined carbs, recommended pt replace refined carbs with complex. Reviewed the benefits swapping in complex carbs and moderating portion sizes can have on managing blood glucose with patient. Pt shared she has a habit of boredom eating/ emotional eating. Recommended trying mindful eating exercises before/ during and after eating, as well as walking through differences between hunger and cravings. Asked pt to honor hunger and respect fullness, and when presented with cravings outside of regular meals/snacks to engage in a non food related activity to distract herself. Per discussion, pt does not use canned/convenience foods often. Pt does not add salt to food. Pt does not eat out frequently. Pt expressed understanding of the information reviewed. Pt aware of nutrition education classes offered and does not plan on attending nutrition classes.  Lab Results  Component Value Date   HGBA1C 5.5 08/30/2018    Wt Readings from Last 3 Encounters:  11/12/18 178 lb 9.2 oz (81 kg)  10/03/18 175 lb (79.4 kg)  09/12/18 180 lb (81.6 kg)    Nutrition Diagnosis ? Food-and nutrition-related knowledge deficit related to lack of exposure to information as related to diagnosis of: ? CVD  Nutrition Intervention ? Pt's individual nutrition plan reviewed with pt. ? Benefits of adopting Heart Healthy diet discussed when Med[employer]s reviewed.   ? Pt given handouts for: ? Nutrition I class ? Nutrition II class ? Diabetes Blitz Class ?  Consistent vit K diet ? low sodium ? DM ? pre-diabetes ? Diabetes Q & A class ? Continue client-centered nutrition education by RD, as part of interdisciplinary care.  Goal(s) ? Pt to identify and limit food sources of saturated fat, trans fat, refined carbohydrates and sodium ? Pt to identify food quantities necessary to achieve weight loss of 6-24 lb at graduation from cardiac rehab.  ? Pt to build a healthy plate including vegetables, fruits, whole grains, and low-fat dairy products in a heart healthy meal plan. ? Pt to weigh and measure serving sizes  Plan:   Pt to attend nutrition classes ? Nutrition I ? Nutrition II ? Portion Distortion   Will provide client-centered nutrition education as part of interdisciplinary care  Monitor and evaluate progress toward nutrition goal with team.    Laurina Bustle, MS, RD, LDN 11/20/2018 10:27 AM

## 2018-11-22 ENCOUNTER — Encounter (HOSPITAL_COMMUNITY)
Admission: RE | Admit: 2018-11-22 | Discharge: 2018-11-22 | Disposition: A | Discharge status: Home / Self Care | Payer: Medicare Other | Source: Ambulatory Visit | Attending: Cardiology | Admitting: Cardiology

## 2018-11-22 DIAGNOSIS — Z952 Presence of prosthetic heart valve: Secondary | ICD-10-CM | POA: Diagnosis not present

## 2018-11-25 ENCOUNTER — Encounter (HOSPITAL_COMMUNITY)
Admission: RE | Admit: 2018-11-25 | Discharge: 2018-11-25 | Disposition: A | Discharge status: Home / Self Care | Payer: Medicare Other | Source: Ambulatory Visit | Attending: Cardiology | Admitting: Cardiology

## 2018-11-25 DIAGNOSIS — Z952 Presence of prosthetic heart valve: Secondary | ICD-10-CM

## 2018-11-25 NOTE — Progress Notes (Signed)
Reviewed home exercise guidelines with patient including endpoints, temperature precautions, target heart rate and rate of perceived exertion. Pt plans to walk 30 minutes, continuous or accumulated, 1-2 days/week as her mode of home exercise. Pt voices understanding of instructions given. Sol Passer, MS, ACSM CEP

## 2018-11-27 ENCOUNTER — Encounter (HOSPITAL_COMMUNITY): Payer: Medicare Other

## 2018-11-28 NOTE — Progress Notes (Signed)
Cardiac Individual Treatment Plan  Patient Details  Name: Zoe [name] MRN: [id number] Date of Birth: 1934-07-30 Referring Provider:     CARDIAC REHAB PHASE II ORIENTATION from 11/12/2018 in [place]  Referring Provider  Adrian Prows, MD      Initial Encounter Date:    CARDIAC REHAB PHASE II ORIENTATION from 11/12/2018 in [facility]  Date  11/12/18      Visit Diagnosis: 09/03/2018 S/P TAVR (transcatheter aortic valve replacement)  Patient's Home Medications on Admission:  Current Outpatient Medications:  .  Artificial Tear Solution (SOOTHE XP) SOLN, Place 1 drop into both eyes every evening., Disp: , Rfl:  .  aspirin 81 MG chewable tablet, Chew 1 tablet (81 mg total) by mouth daily., Disp: , Rfl:  .  carvedilol (COREG) 12.5 MG tablet, Take 12.5 mg by mouth 2 (two) times daily with a meal., Disp: , Rfl:  .  Cholecalciferol (VITAMIN D3) 2000 units TABS, Take 2,000 Units by mouth daily., Disp: , Rfl:  .  clindamycin (CLEOCIN) 300 MG capsule, Take 2 tabs (600 mg) 1 hour before dental work, Disp: 2 capsule, Rfl: 4 .  clopidogrel (PLAVIX) 75 MG tablet, Take 1 tablet (75 mg total) by mouth daily with breakfast., Disp: 90 tablet, Rfl: 1 .  hydrochlorothiazide (HYDRODIURIL) 25 MG tablet, Take 25 mg by mouth daily., Disp: , Rfl: 4 .  levothyroxine (SYNTHROID, LEVOTHROID) 100 MCG tablet, Take 100 mcg by mouth daily before breakfast., Disp: , Rfl: 2 .  losartan (COZAAR) 25 MG tablet, Take 50 mg by mouth 2 (two) times daily. , Disp: , Rfl:  .  Multiple Vitamins-Minerals (PRESERVISION AREDS 2) CAPS, Take 1 tablet by mouth 2 (two) times daily., Disp: , Rfl:  .  pantoprazole (PROTONIX) 40 MG tablet, Take 1 tablet (40 mg total) by mouth daily., Disp: 90 tablet, Rfl: 1 .  pravastatin (PRAVACHOL) 40 MG tablet, Take 40 mg by mouth every evening., Disp: , Rfl:   Past Medical History: Past Medical History:  Diagnosis Date  . Arthritis     some - per patient  . Breast cancer ([place])    breast cancer / left   . Cataract    bilat   . GERD (gastroesophageal reflux disease)   . History of kidney stones   . Hyperlipidemia   . Hypertension   . Hypothyroidism   . Macular degeneration    Left  . S/P TAVR (transcatheter aortic valve replacement) 09/03/2018   23 mm Edwards Sapien 3 transcatheter heart valve placed via percutaneous right transfemoral approach   . Severe aortic stenosis   . Stress incontinence   . Thyroid disease   . Tinnitus     Tobacco Use: Social History   Tobacco Use  Smoking Status Never Smoker  Smokeless Tobacco Never Used    Labs: Recent Review Flowsheet Data    Labs for ITP Cardiac and Pulmonary Rehab Latest Ref Rng & Units 08/06/2018 08/30/2018 09/03/2018 09/03/2018 09/03/2018   Hemoglobin A1c 4.8 - 5.6 % - 5.5 - - -   PHART 7.350 - 7.450 7.361 7.436 - - -   PCO2ART 32.0 - 48.0 mmHg 47.8 37.7 - - -   HCO3 20.0 - 28.0 mmol/L 27.1 25.0 - - -   TCO2 22 - 32 mmol/L 29 - 26 30 30    O2SAT % 95.0 96.9 - - -      Capillary Blood Glucose: No results found for: GLUCAP   Exercise Target  Goals: Exercise Program Goal: Individual exercise prescription set using results from initial 6 min walk test and THRR while considering  patient's activity barriers and safety.   Exercise Prescription Goal: Initial exercise prescription builds to 30-45 minutes a day of aerobic activity, 2-3 days per week.  Home exercise guidelines will be given to patient during program as part of exercise prescription that the participant will acknowledge.  Activity Barriers & Risk Stratification: Activity Barriers & Cardiac Risk Stratification - 11/12/18 0916      Activity Barriers & Cardiac Risk Stratification   Activity Barriers  Left Knee Replacement;Right Knee Replacement;Arthritis    Cardiac Risk Stratification  High       6 Minute Walk: 6 Minute Walk    Row Name 11/12/18 0841         6 Minute Walk   Phase   Initial     Distance  1074 feet     Walk Time  6 minutes     # of Rest Breaks  1     MPH  2.03     METS  1.84     RPE  13     Perceived Dyspnea   3     Symptoms  Yes (comment)     Comments  SOB. Rest break taken from minute 2.95 to 3.67, rollator given at that time.     Resting HR  78 bpm     Resting BP  124/70     Resting Oxygen Saturation   97 %     Exercise Oxygen Saturation  during 6 min walk  97 %     Max Ex. HR  112 bpm     Max Ex. BP  142/74     2 Minute Post BP  132/72        Oxygen Initial Assessment:   Oxygen Re-Evaluation:   Oxygen Discharge (Final Oxygen Re-Evaluation):   Initial Exercise Prescription: Initial Exercise Prescription - 11/12/18 1100      Date of Initial Exercise RX and Referring Provider   Date  11/12/18    Referring Provider  Adrian Prows, MD    Expected Discharge Date  02/17/19      Recumbant Bike   Level  1    Minutes  10    METs  2.2      NuStep   Level  1    SPM  85    Minutes  10    METs  2.2      Track   Laps  8    Minutes  10    METs  2.39      Prescription Details   Frequency (times per week)  3    Duration  Progress to 30 minutes of continuous aerobic without signs/symptoms of physical distress      Intensity   THRR 40-80% of Max Heartrate  55-110    Ratings of Perceived Exertion  11-13    Perceived Dyspnea  0-4      Progression   Progression  Continue to progress workloads to maintain intensity without signs/symptoms of physical distress.      Resistance Training   Training Prescription  Yes    Weight  2lbs    Reps  10-15       Perform Capillary Blood Glucose checks as needed.  Exercise Prescription Changes: Exercise Prescription Changes    Row Name 11/18/18 0947             Response to Exercise  Blood Pressure (Admit)  108/60       Blood Pressure (Exercise)  158/60       Blood Pressure (Exit)  122/70       Heart Rate (Admit)  90 bpm       Heart Rate (Exercise)  110 bpm       Heart Rate (Exit)   83 bpm       Rating of Perceived Exertion (Exercise)  13       Symptoms  none       Duration  Progress to 30 minutes of  aerobic without signs/symptoms of physical distress       Intensity  THRR unchanged         Progression   Progression  Continue to progress workloads to maintain intensity without signs/symptoms of physical distress.       Average METs  2         Resistance Training   Training Prescription  Yes       Weight  2lbs       Reps  10-15       Time  10 Minutes         Interval Training   Interval Training  No         Recumbant Bike   Level  1       Minutes  10       METs  1.8         NuStep   Level  1       SPM  85       Minutes  10       METs  2.1         Track   Laps  5       Minutes  8       METs  2.09          Exercise Comments: Exercise Comments    Row Name 11/18/18 0947 11/25/18 1010         Exercise Comments  Patient tolerated 1st session of exercise well without c/o.  Reviewed home exercise gudielines, METs, and goals with patient.         Exercise Goals and Review: Exercise Goals    Row Name 11/12/18 0916             Exercise Goals   Increase Physical Activity  Yes       Intervention  Provide advice, education, support and counseling about physical activity/exercise needs.;Develop an individualized exercise prescription for aerobic and resistive training based on initial evaluation findings, risk stratification, comorbidities and participant's personal goals.       Expected Outcomes  Short Term: Attend rehab on a regular basis to increase amount of physical activity.;Long Term: Exercising regularly at least 3-5 days a week.;Long Term: Add in home exercise to make exercise part of routine and to increase amount of physical activity.       Increase Strength and Stamina  Yes       Intervention  Provide advice, education, support and counseling about physical activity/exercise needs.;Develop an individualized exercise prescription for  aerobic and resistive training based on initial evaluation findings, risk stratification, comorbidities and participant's personal goals.       Expected Outcomes  Short Term: Increase workloads from initial exercise prescription for resistance, speed, and METs.;Short Term: Perform resistance training exercises routinely during rehab and add in resistance training at home;Long Term: Improve cardiorespiratory fitness, muscular endurance and strength as measured by  increased METs and functional capacity (6MWT)       Able to understand and use rate of perceived exertion (RPE) scale  Yes       Intervention  Provide education and explanation on how to use RPE scale       Expected Outcomes  Short Term: Able to use RPE daily in rehab to express subjective intensity level;Long Term:  Able to use RPE to guide intensity level when exercising independently       Knowledge and understanding of Target Heart Rate Range (THRR)  Yes       Intervention  Provide education and explanation of THRR including how the numbers were predicted and where they are located for reference       Expected Outcomes  Short Term: Able to state/look up THRR;Long Term: Able to use THRR to govern intensity when exercising independently;Short Term: Able to use daily as guideline for intensity in rehab       Able to check pulse independently  Yes       Intervention  Provide education and demonstration on how to check pulse in carotid and radial arteries.;Review the importance of being able to check your own pulse for safety during independent exercise       Expected Outcomes  Short Term: Able to explain why pulse checking is important during independent exercise;Long Term: Able to check pulse independently and accurately       Understanding of Exercise Prescription  Yes       Intervention  Provide education, explanation, and written materials on patient's individual exercise prescription       Expected Outcomes  Short Term: Able to explain  program exercise prescription;Long Term: Able to explain home exercise prescription to exercise independently          Exercise Goals Re-Evaluation : Exercise Goals Re-Evaluation    Row Name 11/18/18 0947 11/25/18 1010           Exercise Goal Re-Evaluation   Exercise Goals Review  Increase Physical Activity;Able to understand and use rate of perceived exertion (RPE) scale;Increase Strength and Stamina  Increase Physical Activity;Able to understand and use rate of perceived exertion (RPE) scale;Increase Strength and Stamina;Knowledge and understanding of Target Heart Rate Range (THRR);Understanding of Exercise Prescription      Comments  Patient able to understand and use RPE scale appropriately.  Reviewed home exercise guidelines with patient including THRR, RPE scale, and endpoints for exercise. Instructed patient on how to count pulse. Pt plans to walk as her mode of home exercise.      Expected Outcomes  Increase workloads as tolerated to help improve stamina and decrease SOB with exertion.  Patient will add 1-2 days walking at home in addition to exercise at cardiac rehab.         Discharge Exercise Prescription (Final Exercise Prescription Changes): Exercise Prescription Changes - 11/18/18 0947      Response to Exercise   Blood Pressure (Admit)  108/60    Blood Pressure (Exercise)  158/60    Blood Pressure (Exit)  122/70    Heart Rate (Admit)  90 bpm    Heart Rate (Exercise)  110 bpm    Heart Rate (Exit)  83 bpm    Rating of Perceived Exertion (Exercise)  13    Symptoms  none    Duration  Progress to 30 minutes of  aerobic without signs/symptoms of physical distress    Intensity  THRR unchanged      Progression  Progression  Continue to progress workloads to maintain intensity without signs/symptoms of physical distress.    Average METs  2      Resistance Training   Training Prescription  Yes    Weight  2lbs    Reps  10-15    Time  10 Minutes      Interval Training    Interval Training  No      Recumbant Bike   Level  1    Minutes  10    METs  1.8      NuStep   Level  1    SPM  85    Minutes  10    METs  2.1      Track   Laps  5    Minutes  8    METs  2.09       Nutrition:  Target Goals: Understanding of nutrition guidelines, daily intake of sodium 1500mg , cholesterol 200mg , calories 30% from fat and 7% or less from saturated fats, daily to have 5 or more servings of fruits and vegetables.  Biometrics: Pre Biometrics - 11/12/18 0807      Pre Biometrics   Height  5\' 4"  (1.626 m)    Weight  81 kg    Waist Circumference  38 inches    Hip Circumference  44 inches    Waist to Hip Ratio  0.86 %    BMI (Calculated)  30.64    Triceps Skinfold  42 mm    % Body Fat  44.8 %    Grip Strength  22.5 kg    Flexibility  8 in    Single Leg Stand  6.68 seconds        Nutrition Therapy Plan and Nutrition Goals: Nutrition Therapy & Goals - 11/20/18 1036      Nutrition Therapy   Diet  general healthful      Personal Nutrition Goals   Nutrition Goal  Pt to identify and limit food sources of saturated fat, trans fat, refined carbohydrates and sodium    Personal Goal #2  Pt to identify food quantities necessary to achieve weight loss of 6-24 lb at graduation from cardiac rehab.     Personal Goal #3  Pt to build a healthy plate including vegetables, fruits, whole grains, and low-fat dairy products in a heart healthy meal plan    Personal Goal #4  Pt to weigh and measure serving sizes      Intervention Plan   Intervention  Prescribe, educate and counsel regarding individualized specific dietary modifications aiming towards targeted core components such as weight, hypertension, lipid management, diabetes, heart failure and other comorbidities.    Expected Outcomes  Short Term Goal: Understand basic principles of dietary content, such as calories, fat, sodium, cholesterol and nutrients.;Long Term Goal: Adherence to prescribed nutrition plan.        Nutrition Assessments: Nutrition Assessments - 11/15/18 0908      MEDFICTS Scores   Pre Score  54       Nutrition Goals Re-Evaluation:   Nutrition Goals Re-Evaluation:   Nutrition Goals Discharge (Final Nutrition Goals Re-Evaluation):   Psychosocial: Target Goals: Acknowledge presence or absence of significant depression and/or stress, maximize coping skills, provide positive support system. Participant is able to verbalize types and ability to use techniques and skills needed for reducing stress and depression.  Initial Review & Psychosocial Screening: Initial Psych Review & Screening - 11/12/18 1243      Initial Review   Current  issues with  Current Stress Concerns    Source of Stress Concerns  Family    Comments  Has family stressors      Family Dynamics   Good Support System?  Yes   Zoe [name] has her husband children and friends for support   [place]  recently loss son in law to lung cancer      Barriers   Psychosocial barriers to participate in program  The patient should benefit from training in stress management and relaxation.      Screening Interventions   Interventions  Encouraged to exercise;To provide support and resources with identified psychosocial needs    Expected Outcomes  Short Term goal: Utilizing psychosocial counselor, staff and physician to assist with identification of specific Stressors or current issues interfering with healing process. Setting desired goal for each stressor or current issue identified.;Long Term Goal: Stressors or current issues are controlled or eliminated.;Short Term goal: Identification and review with participant of any Quality of Life or Depression concerns found by scoring the questionnaire.       Quality of Life Scores: Quality of Life - 11/12/18 1148      Quality of Life   Select  Quality of Life      Quality of Life Scores   Health/Function Pre  18.43 %    Socioeconomic Pre  27.36 %    Psych/Spiritual  Pre  26.57 %    Family Pre  23.4 %    GLOBAL Pre  22.8 %      Scores of 19 and below usually indicate a poorer quality of life in these areas.  A difference of  2-3 points is a clinically meaningful difference.  A difference of 2-3 points in the total score of the Quality of Life Index has been associated with significant improvement in overall quality of life, self-image, physical symptoms, and general health in studies assessing change in quality of life.  PHQ-9: Recent Review Flowsheet Data    Depression screen [name] 2/9 11/18/2018   Decreased Interest 0   Down, Depressed, Hopeless 0   PHQ - 2 Score 0     Interpretation of Total Score  Total Score Depression Severity:  1-4 = Minimal depression, 5-9 = Mild depression, 10-14 = Moderate depression, 15-19 = Moderately severe depression, 20-27 = Severe depression   Psychosocial Evaluation and Intervention:   Psychosocial Re-Evaluation: Psychosocial Re-Evaluation    [place] Name 11/28/18 1141             Psychosocial Re-Evaluation   Current issues with  Current Stress Concerns       Comments  Zoe [name] has a daughter who is going through a divorce currently with three children. Will continue to offer emotional support as needed       Interventions  Stress management education       Continue Psychosocial Services   No Follow up required       Comments  Has family stressors         Initial Review   Source of Stress Concerns  Family          Psychosocial Discharge (Final Psychosocial Re-Evaluation): Psychosocial Re-Evaluation - 11/28/18 1141      Psychosocial Re-Evaluation   Current issues with  Current Stress Concerns    Comments  Zoe [name] has a daughter who is going through a divorce currently with three children. Will continue to offer emotional support as needed    Interventions  Stress management education    Continue  Psychosocial Services   No Follow up required    Comments  Has family stressors      Initial Review   Source of  Stress Concerns  Family       Vocational Rehabilitation: Provide vocational rehab assistance to qualifying candidates.   Vocational Rehab Evaluation & Intervention: Vocational Rehab - 11/12/18 1249      Initial Vocational Rehab Evaluation & Intervention   Assessment shows need for Vocational Rehabilitation  No   Zoe [name] is retired and does not need voational rehab at this time      Education: Education Goals: Education classes will be provided on a weekly basis, covering required topics. Participant will state understanding/return demonstration of topics presented.  Learning Barriers/Preferences: Learning Barriers/Preferences - 11/12/18 1247      Learning Barriers/Preferences   Learning Barriers  Sight   Wears glasses   Learning Preferences  Written Material;Pictoral;Video       Education Topics: Count Your Pulse:  -Group instruction provided by verbal instruction, demonstration, patient participation and written materials to support subject.  Instructors address importance of being able to find your pulse and how to count your pulse when at home without a heart monitor.  Patients get hands on experience counting their pulse with staff help and individually.   Heart Attack, Angina, and Risk Factor Modification:  -Group instruction provided by verbal instruction, video, and written materials to support subject.  Instructors address signs and symptoms of angina and heart attacks.    Also discuss risk factors for heart disease and how to make changes to improve heart health risk factors.   Functional Fitness:  -Group instruction provided by verbal instruction, demonstration, patient participation, and written materials to support subject.  Instructors address safety measures for doing things around the house.  Discuss how to get up and down off the floor, how to pick things up properly, how to safely get out of a chair without assistance, and balance training.   Meditation and  Mindfulness:  -Group instruction provided by verbal instruction, patient participation, and written materials to support subject.  Instructor addresses importance of mindfulness and meditation practice to help reduce stress and improve awareness.  Instructor also leads participants through a meditation exercise.    Stretching for Flexibility and Mobility:  -Group instruction provided by verbal instruction, patient participation, and written materials to support subject.  Instructors lead participants through series of stretches that are designed to increase flexibility thus improving mobility.  These stretches are additional exercise for major muscle groups that are typically performed during regular warm up and cool down.   Hands Only CPR:  -Group verbal, video, and participation provides a basic overview of AHA guidelines for community CPR. Role-play of emergencies allow participants the opportunity to practice calling for help and chest compression technique with discussion of AED use.   Hypertension: -Group verbal and written instruction that provides a basic overview of hypertension including the most recent diagnostic guidelines, risk factor reduction with self-care instructions and medication management.    Nutrition I class: Heart Healthy Eating:  -Group instruction provided by PowerPoint slides, verbal discussion, and written materials to support subject matter. The instructor gives an explanation and review of the Therapeutic Lifestyle Changes diet recommendations, which includes a discussion on lipid goals, dietary fat, sodium, fiber, plant stanol/sterol esters, sugar, and the components of a well-balanced, healthy diet.   Nutrition II class: Lifestyle Skills:  -Group instruction provided by PowerPoint slides, verbal discussion, and written materials to support subject matter. The  instructor gives an explanation and review of label reading, grocery shopping for heart health, heart  healthy recipe modifications, and ways to make healthier choices when eating out.   Diabetes Question & Answer:  -Group instruction provided by PowerPoint slides, verbal discussion, and written materials to support subject matter. The instructor gives an explanation and review of diabetes co-morbidities, pre- and post-prandial blood glucose goals, pre-exercise blood glucose goals, signs, symptoms, and treatment of hypoglycemia and hyperglycemia, and foot care basics.   Diabetes Blitz:  -Group instruction provided by PowerPoint slides, verbal discussion, and written materials to support subject matter. The instructor gives an explanation and review of the physiology behind type 1 and type 2 diabetes, diabetes medications and rational behind using different medications, pre- and post-prandial blood glucose recommendations and Hemoglobin A1c goals, diabetes diet, and exercise including blood glucose guidelines for exercising safely.    Portion Distortion:  -Group instruction provided by PowerPoint slides, verbal discussion, written materials, and food models to support subject matter. The instructor gives an explanation of serving size versus portion size, changes in portions sizes over the last 20 years, and what consists of a serving from each food group.   Stress Management:  -Group instruction provided by verbal instruction, video, and written materials to support subject matter.  Instructors review role of stress in heart disease and how to cope with stress positively.     CARDIAC REHAB PHASE II EXERCISE from 11/20/2018 in [place]  Date  11/20/18  Educator  RN  Instruction Review Code  2- Demonstrated Understanding      Exercising on Your Own:  -Group instruction provided by verbal instruction, power point, and written materials to support subject.  Instructors discuss benefits of exercise, components of exercise, frequency and intensity of exercise, and  end points for exercise.  Also discuss use of nitroglycerin and activating EMS.  Review options of places to exercise outside of rehab.  Review guidelines for sex with heart disease.   Cardiac Drugs I:  -Group instruction provided by verbal instruction and written materials to support subject.  Instructor reviews cardiac drug classes: antiplatelets, anticoagulants, beta blockers, and statins.  Instructor discusses reasons, side effects, and lifestyle considerations for each drug class.   Cardiac Drugs II:  -Group instruction provided by verbal instruction and written materials to support subject.  Instructor reviews cardiac drug classes: angiotensin converting enzyme inhibitors (ACE-I), angiotensin II receptor blockers (ARBs), nitrates, and calcium channel blockers.  Instructor discusses reasons, side effects, and lifestyle considerations for each drug class.   Anatomy and Physiology of the Circulatory System:  Group verbal and written instruction and models provide basic cardiac anatomy and physiology, with the coronary electrical and arterial systems. Review of: AMI, Angina, Valve disease, Heart Failure, Peripheral Artery Disease, Cardiac Arrhythmia, Pacemakers, and the ICD.   Other Education:  -Group or individual verbal, written, or video instructions that support the educational goals of the cardiac rehab program.   Holiday Eating Survival Tips:  -Group instruction provided by PowerPoint slides, verbal discussion, and written materials to support subject matter. The instructor gives patients tips, tricks, and techniques to help them not only survive but enjoy the holidays despite the onslaught of food that accompanies the holidays.   Knowledge Questionnaire Score: Knowledge Questionnaire Score - 11/12/18 1144      Knowledge Questionnaire Score   Pre Score  22/24       Core Components/Risk Factors/Patient Goals at Admission: Personal Goals and Risk Factors at Admission -  11/12/18  1151      Core Components/Risk Factors/Patient Goals on Admission    Weight Management  Yes;Obesity    Intervention  Weight Management: Develop a combined nutrition and exercise program designed to reach desired caloric intake, while maintaining appropriate intake of nutrient and fiber, sodium and fats, and appropriate energy expenditure required for the weight goal.;Weight Management: Provide education and appropriate resources to help participant work on and attain dietary goals.;Weight Management/Obesity: Establish reasonable short term and long term weight goals.;Obesity: Provide education and appropriate resources to help participant work on and attain dietary goals.    Admit Weight  178 lb 9.2 oz (81 kg)    Goal Weight: Short Term  172 lb (78 kg)    Goal Weight: Long Term  160 lb (72.6 kg)    Expected Outcomes  Short Term: Continue to assess and modify interventions until short term weight is achieved;Long Term: Adherence to nutrition and physical activity/exercise program aimed toward attainment of established weight goal;Weight Loss: Understanding of general recommendations for a balanced deficit meal plan, which promotes 1-2 lb weight loss per week and includes a negative energy balance of [phone] kcal/d;Understanding recommendations for meals to include 15-35% energy as protein, 25-35% energy from fat, 35-60% energy from carbohydrates, less than 200mg  of dietary cholesterol, 20-35 gm of total fiber daily;Understanding of distribution of calorie intake throughout the day with the consumption of 4-5 meals/snacks    Improve shortness of breath with ADL's  Yes    Intervention  Provide education, individualized exercise plan and daily activity instruction to help decrease symptoms of SOB with activities of daily living.    Expected Outcomes  Short Term: Improve cardiorespiratory fitness to achieve a reduction of symptoms when performing ADLs;Long Term: Be able to perform more ADLs without symptoms  or delay the onset of symptoms    Hypertension  Yes    Intervention  Provide education on lifestyle modifcations including regular physical activity/exercise, weight management, moderate sodium restriction and increased consumption of fresh fruit, vegetables, and low fat dairy, alcohol moderation, and smoking cessation.;Monitor prescription use compliance.    Expected Outcomes  Short Term: Continued assessment and intervention until BP is < 140/60mm HG in hypertensive participants. < 130/20mm HG in hypertensive participants with diabetes, heart failure or chronic kidney disease.;Long Term: Maintenance of blood pressure at goal levels.    Lipids  Yes    Intervention  Provide education and support for participant on nutrition & aerobic/resistive exercise along with prescribed medications to achieve LDL 70mg , HDL >40mg .    Expected Outcomes  Short Term: Participant states understanding of desired cholesterol values and is compliant with medications prescribed. Participant is following exercise prescription and nutrition guidelines.;Long Term: Cholesterol controlled with medications as prescribed, with individualized exercise RX and with personalized nutrition plan. Value goals: LDL < 70mg , HDL > 40 mg.    Stress  Yes    Intervention  Offer individual and/or small group education and counseling on adjustment to heart disease, stress management and health-related lifestyle change. Teach and support self-help strategies.;Refer participants experiencing significant psychosocial distress to appropriate mental health specialists for further evaluation and treatment. When possible, include family members and significant others in education/counseling sessions.    Expected Outcomes  Short Term: Participant demonstrates changes in health-related behavior, relaxation and other stress management skills, ability to obtain effective social support, and compliance with psychotropic medications if prescribed.;Long Term:  Emotional wellbeing is indicated by absence of clinically significant psychosocial distress or social isolation.  Core Components/Risk Factors/Patient Goals Review:  Goals and Risk Factor Review    Row Name 11/28/18 1147 11/28/18 1151           Core Components/Risk Factors/Patient Goals Review   Personal Goals Review  Weight Management/Obesity  Weight Management/Obesity;Lipids;Improve shortness of breath with ADL's;Hypertension;Stress      Review  -  Zoe [name] has been doing well with exercise encouraged to take rest breaks as needed. Zoe [name]'s vital signs have been stable.      Expected Outcomes  -  Zoe [name] will continue to participate in cardiac rehab for exercise, nutrition and lifestyle modifications         Core Components/Risk Factors/Patient Goals at Discharge (Final Review):  Goals and Risk Factor Review - 11/28/18 1151      Core Components/Risk Factors/Patient Goals Review   Personal Goals Review  Weight Management/Obesity;Lipids;Improve shortness of breath with ADL's;Hypertension;Stress    Review  Zoe [name] has been doing well with exercise encouraged to take rest breaks as needed. Zoe [name]'s vital signs have been stable.    Expected Outcomes  Zoe [name] will continue to participate in cardiac rehab for exercise, nutrition and lifestyle modifications       ITP Comments: ITP Comments    Row Name 11/12/18 0844 11/28/18 1131         ITP Comments  Dr. Fransico Him, Medical Director   30 Day ITP Zoe [name] is off to a good start to exercise. Patient is with good attendance and participation in phase 2 cardiac rehab         Comments: See ITP comments.Barnet Pall, RN,BSN 11/28/2018 12:01 PM

## 2018-11-29 ENCOUNTER — Encounter (HOSPITAL_COMMUNITY)
Admission: RE | Admit: 2018-11-29 | Discharge: 2018-11-29 | Disposition: A | Discharge status: Home / Self Care | Payer: Medicare Other | Source: Ambulatory Visit | Attending: Cardiology | Admitting: Cardiology

## 2018-11-29 DIAGNOSIS — Z952 Presence of prosthetic heart valve: Secondary | ICD-10-CM

## 2018-12-02 ENCOUNTER — Encounter (HOSPITAL_COMMUNITY)
Admission: RE | Admit: 2018-12-02 | Discharge: 2018-12-02 | Disposition: A | Discharge status: Home / Self Care | Payer: Medicare Other | Source: Ambulatory Visit | Attending: Cardiology | Admitting: Cardiology

## 2018-12-02 DIAGNOSIS — Z952 Presence of prosthetic heart valve: Secondary | ICD-10-CM

## 2018-12-04 ENCOUNTER — Encounter (HOSPITAL_COMMUNITY)
Admission: RE | Admit: 2018-12-04 | Discharge: 2018-12-04 | Disposition: A | Discharge status: Home / Self Care | Payer: Medicare Other | Source: Ambulatory Visit | Attending: Cardiology | Admitting: Cardiology

## 2018-12-04 DIAGNOSIS — Z952 Presence of prosthetic heart valve: Secondary | ICD-10-CM

## 2018-12-06 ENCOUNTER — Encounter (HOSPITAL_COMMUNITY)
Admission: RE | Admit: 2018-12-06 | Discharge: 2018-12-06 | Disposition: A | Discharge status: Home / Self Care | Payer: Medicare Other | Source: Ambulatory Visit | Attending: Cardiology | Admitting: Cardiology

## 2018-12-06 DIAGNOSIS — Z952 Presence of prosthetic heart valve: Secondary | ICD-10-CM

## 2018-12-09 ENCOUNTER — Encounter (HOSPITAL_COMMUNITY)
Admission: RE | Admit: 2018-12-09 | Discharge: 2018-12-09 | Disposition: A | Discharge status: Home / Self Care | Payer: Medicare Other | Source: Ambulatory Visit | Attending: Cardiology | Admitting: Cardiology

## 2018-12-09 DIAGNOSIS — Z952 Presence of prosthetic heart valve: Secondary | ICD-10-CM | POA: Diagnosis not present

## 2018-12-11 ENCOUNTER — Encounter (HOSPITAL_COMMUNITY)
Admission: RE | Admit: 2018-12-11 | Discharge: 2018-12-11 | Disposition: A | Discharge status: Home / Self Care | Payer: Medicare Other | Source: Ambulatory Visit | Attending: Cardiology | Admitting: Cardiology

## 2018-12-11 DIAGNOSIS — Z952 Presence of prosthetic heart valve: Secondary | ICD-10-CM

## 2018-12-11 NOTE — Progress Notes (Signed)
[place] B Meschke 85 y.o. female Nutrition Note Spoke with pt. Nutrition Plan and Nutrition Survey goals reviewed with pt. Pt is following a Heart Healthy diet. Discussed red meat consumption and frequency today with patient pertaining to heart healthy guidelines. Pt shared she read an article from google that stated red meat consumption daily was okay. Discussed with patient the difference between one research study vs repeated reliable validated studies. Pt expressed understanding and was open/ receptive to RD recommendations today.    Lab Results  Component Value Date   HGBA1C 5.5 08/30/2018    Wt Readings from Last 3 Encounters:  11/12/18 178 lb 9.2 oz (81 kg)  10/03/18 175 lb (79.4 kg)  09/12/18 180 lb (81.6 kg)    Nutrition Diagnosis ? Food-and nutrition-related knowledge deficit related to lack of exposure to information as related to diagnosis of: ? CVD  Nutrition Intervention ? Pt's individual nutrition plan reviewed with pt.    Goal(s) ? Pt to identify and limit food sources of saturated fat, trans fat, refined carbohydrates and sodium ? Pt to identify food quantities necessary to achieve weight loss of 6-24 lb at graduation from cardiac rehab.  ? Pt to build a healthy plate including vegetables, fruits, whole grains, and low-fat dairy products in a heart healthy meal plan. ? Pt to weigh and measure serving sizes  Plan:   Pt to attend nutrition classes ? Nutrition I ? Nutrition II ? Portion Distortion   Will provide client-centered nutrition education as part of interdisciplinary care  Monitor and evaluate progress toward nutrition goal with team.    Laurina Bustle, MS, RD, LDN 12/11/2018 10:52 AM

## 2018-12-12 ENCOUNTER — Telehealth: Payer: Self-pay

## 2018-12-12 DIAGNOSIS — R0609 Other forms of dyspnea: Principal | ICD-10-CM

## 2018-12-12 NOTE — Telephone Encounter (Signed)
Pt had a aortic valve replacement in on 09/03/2018, and has been going to cardiac rehab since January.  She is concerned because she has been experiencing shortness of breath. Stated when she walks only a short distance she is very winded. Wants to schedule a sooner appt.   Zoe [name]

## 2018-12-13 ENCOUNTER — Encounter (HOSPITAL_COMMUNITY): Payer: Medicare Other

## 2018-12-13 NOTE — Telephone Encounter (Signed)
Cxr pa and lateral view and order BNP: Indication dyspnea on exertion, then OV

## 2018-12-16 ENCOUNTER — Encounter (HOSPITAL_COMMUNITY)
Admission: RE | Admit: 2018-12-16 | Discharge: 2018-12-16 | Disposition: A | Discharge status: Home / Self Care | Payer: Medicare Other | Source: Ambulatory Visit | Attending: Cardiology | Admitting: Cardiology

## 2018-12-16 DIAGNOSIS — Z952 Presence of prosthetic heart valve: Secondary | ICD-10-CM

## 2018-12-16 NOTE — Progress Notes (Signed)
Patient reports that her shortness of breath has not improved since she had surgery in November. Donia Guiles says she has not had an improvement in her shortness of breath since she started cardiac rehab. Donia Guiles says her shortness of breath is intermittent. Vital sing stable. Oxygen saturations 96% on room air. Dr Irven Shelling office called and notified. Appointment obtained for Ginny to follow up with Dr Einar Gip on Tuesday, March 17 at 3:30 pm. Upon assessment lung fields essentially clear but coarse. No peripheral edema noted. I left a message to call the patient as she says she has not received the results from her [place], RN,BSN 12/16/2018 10:41 AM

## 2018-12-17 DIAGNOSIS — R0609 Other forms of dyspnea: Secondary | ICD-10-CM | POA: Insufficient documentation

## 2018-12-17 NOTE — Telephone Encounter (Signed)
Orders placed.

## 2018-12-17 NOTE — Telephone Encounter (Signed)
Can you please put the orders in? Thank you!!

## 2018-12-18 ENCOUNTER — Encounter (HOSPITAL_COMMUNITY)
Admission: RE | Admit: 2018-12-18 | Discharge: 2018-12-18 | Disposition: A | Discharge status: Home / Self Care | Payer: Medicare Other | Source: Ambulatory Visit | Attending: Cardiology | Admitting: Cardiology

## 2018-12-18 DIAGNOSIS — Z952 Presence of prosthetic heart valve: Secondary | ICD-10-CM | POA: Diagnosis not present

## 2018-12-20 ENCOUNTER — Encounter (HOSPITAL_COMMUNITY): Payer: Medicare Other

## 2018-12-23 ENCOUNTER — Encounter (HOSPITAL_COMMUNITY)
Admission: RE | Admit: 2018-12-23 | Discharge: 2018-12-23 | Disposition: A | Discharge status: Home / Self Care | Payer: Medicare Other | Source: Ambulatory Visit | Attending: Cardiology | Admitting: Cardiology

## 2018-12-23 DIAGNOSIS — Z952 Presence of prosthetic heart valve: Secondary | ICD-10-CM | POA: Diagnosis not present

## 2018-12-25 ENCOUNTER — Encounter (HOSPITAL_COMMUNITY)
Admission: RE | Admit: 2018-12-25 | Discharge: 2018-12-25 | Disposition: A | Discharge status: Home / Self Care | Payer: Medicare Other | Source: Ambulatory Visit | Attending: Cardiology | Admitting: Cardiology

## 2018-12-25 DIAGNOSIS — Z952 Presence of prosthetic heart valve: Secondary | ICD-10-CM

## 2018-12-26 NOTE — Progress Notes (Signed)
Cardiac Individual Treatment Plan  Patient Details  Name: Zoe [name] MRN: [id number] Date of Birth: September 22, 1934 Referring Provider:     CARDIAC REHAB PHASE II ORIENTATION from 11/12/2018 in [place]  Referring Provider  Adrian Prows, MD      Initial Encounter Date:    CARDIAC REHAB PHASE II ORIENTATION from 11/12/2018 in [place]  Date  11/12/18      Visit Diagnosis: 09/03/2018 S/P TAVR (transcatheter aortic valve replacement)  Patient's Home Medications on Admission:  Current Outpatient Medications:  .  Artificial Tear Solution (SOOTHE XP) SOLN, Place 1 drop into both eyes every evening., Disp: , Rfl:  .  aspirin 81 MG chewable tablet, Chew 1 tablet (81 mg total) by mouth daily., Disp: , Rfl:  .  carvedilol (COREG) 12.5 MG tablet, Take 12.5 mg by mouth 2 (two) times daily with a meal., Disp: , Rfl:  .  Cholecalciferol (VITAMIN D3) 2000 units TABS, Take 2,000 Units by mouth daily., Disp: , Rfl:  .  clindamycin (CLEOCIN) 300 MG capsule, Take 2 tabs (600 mg) 1 hour before dental work, Disp: 2 capsule, Rfl: 4 .  clopidogrel (PLAVIX) 75 MG tablet, Take 1 tablet (75 mg total) by mouth daily with breakfast., Disp: 90 tablet, Rfl: 1 .  hydrochlorothiazide (HYDRODIURIL) 25 MG tablet, Take 25 mg by mouth daily., Disp: , Rfl: 4 .  levothyroxine (SYNTHROID, LEVOTHROID) 100 MCG tablet, Take 100 mcg by mouth daily before breakfast., Disp: , Rfl: 2 .  losartan (COZAAR) 25 MG tablet, Take 50 mg by mouth 2 (two) times daily. , Disp: , Rfl:  .  Multiple Vitamins-Minerals (PRESERVISION AREDS 2) CAPS, Take 1 tablet by mouth 2 (two) times daily., Disp: , Rfl:  .  pantoprazole (PROTONIX) 40 MG tablet, Take 1 tablet (40 mg total) by mouth daily., Disp: 90 tablet, Rfl: 1 .  pravastatin (PRAVACHOL) 40 MG tablet, Take 40 mg by mouth every evening., Disp: , Rfl:   Past Medical History: Past Medical History:  Diagnosis Date  . Arthritis     some - per patient  . Breast cancer ([place])    breast cancer / left   . Cataract    bilat   . GERD (gastroesophageal reflux disease)   . History of kidney stones   . Hyperlipidemia   . Hypertension   . Hypothyroidism   . Macular degeneration    Left  . S/P TAVR (transcatheter aortic valve replacement) 09/03/2018   23 mm Edwards Sapien 3 transcatheter heart valve placed via percutaneous right transfemoral approach   . Severe aortic stenosis   . Stress incontinence   . Thyroid disease   . Tinnitus     Tobacco Use: Social History   Tobacco Use  Smoking Status Never Smoker  Smokeless Tobacco Never Used    Labs: Recent Review Flowsheet Data    Labs for ITP Cardiac and Pulmonary Rehab Latest Ref Rng & Units 08/06/2018 08/30/2018 09/03/2018 09/03/2018 09/03/2018   Hemoglobin A1c 4.8 - 5.6 % - 5.5 - - -   PHART 7.350 - 7.450 7.361 7.436 - - -   PCO2ART 32.0 - 48.0 mmHg 47.8 37.7 - - -   HCO3 20.0 - 28.0 mmol/L 27.1 25.0 - - -   TCO2 22 - 32 mmol/L 29 - 26 30 30    O2SAT % 95.0 96.9 - - -      Capillary Blood Glucose: No results found for: GLUCAP   Exercise Target  Goals: Exercise Program Goal: Individual exercise prescription set using results from initial 6 min walk test and THRR while considering  patient's activity barriers and safety.   Exercise Prescription Goal: Initial exercise prescription builds to 30-45 minutes a day of aerobic activity, 2-3 days per week.  Home exercise guidelines will be given to patient during program as part of exercise prescription that the participant will acknowledge.  Activity Barriers & Risk Stratification: Activity Barriers & Cardiac Risk Stratification - 11/12/18 0916      Activity Barriers & Cardiac Risk Stratification   Activity Barriers  Left Knee Replacement;Right Knee Replacement;Arthritis    Cardiac Risk Stratification  High       6 Minute Walk: 6 Minute Walk    Row Name 11/12/18 0841         6 Minute Walk   Phase   Initial     Distance  1074 feet     Walk Time  6 minutes     # of Rest Breaks  1     MPH  2.03     METS  1.84     RPE  13     Perceived Dyspnea   3     Symptoms  Yes (comment)     Comments  SOB. Rest break taken from minute 2.95 to 3.67, rollator given at that time.     Resting HR  78 bpm     Resting BP  124/70     Resting Oxygen Saturation   97 %     Exercise Oxygen Saturation  during 6 min walk  97 %     Max Ex. HR  112 bpm     Max Ex. BP  142/74     2 Minute Post BP  132/72        Oxygen Initial Assessment:   Oxygen Re-Evaluation:   Oxygen Discharge (Final Oxygen Re-Evaluation):   Initial Exercise Prescription: Initial Exercise Prescription - 11/12/18 1100      Date of Initial Exercise RX and Referring Provider   Date  11/12/18    Referring Provider  Adrian Prows, MD    Expected Discharge Date  02/17/19      Recumbant Bike   Level  1    Minutes  10    METs  2.2      NuStep   Level  1    SPM  85    Minutes  10    METs  2.2      Track   Laps  8    Minutes  10    METs  2.39      Prescription Details   Frequency (times per week)  3    Duration  Progress to 30 minutes of continuous aerobic without signs/symptoms of physical distress      Intensity   THRR 40-80% of Max Heartrate  55-110    Ratings of Perceived Exertion  11-13    Perceived Dyspnea  0-4      Progression   Progression  Continue to progress workloads to maintain intensity without signs/symptoms of physical distress.      Resistance Training   Training Prescription  Yes    Weight  2lbs    Reps  10-15       Perform Capillary Blood Glucose checks as needed.  Exercise Prescription Changes: Exercise Prescription Changes    Row Name 11/18/18 [phone] 12/02/18 0952 12/16/18 0952         Response to Exercise  Blood Pressure (Admit)  108/60  122/72  122/64     Blood Pressure (Exercise)  158/60  124/78  124/80     Blood Pressure (Exit)  122/70  116/64  116/62     Heart Rate (Admit)  90  bpm  95 bpm  87 bpm     Heart Rate (Exercise)  110 bpm  109 bpm  101 bpm     Heart Rate (Exit)  83 bpm  96 bpm  66 bpm     Rating of Perceived Exertion (Exercise)  13  13  13      Symptoms  none  none  SOB     Duration  Progress to 30 minutes of  aerobic without signs/symptoms of physical distress  Progress to 30 minutes of  aerobic without signs/symptoms of physical distress  Progress to 30 minutes of  aerobic without signs/symptoms of physical distress     Intensity  THRR unchanged  THRR unchanged  THRR unchanged       Progression   Progression  Continue to progress workloads to maintain intensity without signs/symptoms of physical distress.  Continue to progress workloads to maintain intensity without signs/symptoms of physical distress.  Continue to progress workloads to maintain intensity without signs/symptoms of physical distress.     Average METs  2  2.6  2.3       Resistance Training   Training Prescription  Yes  Yes  Yes     Weight  2lbs  2lbs  2lbs     Reps  10-15  10-15  10-15     Time  10 Minutes  10 Minutes  10 Minutes       Interval Training   Interval Training  No  No  No       Recumbant Bike   Level  1  2  1.5 Decreased workloads because difficulty for patient.     Minutes  10  10  10      METs  1.8  2.3  2.3       NuStep   Level  1  2  2      SPM  85  85  85     Minutes  10  10  10      METs  2.1  2.7  2.7       Track   Laps  5  10  5      Minutes  8  8  8      METs  2.09  2.74  1.84       Home Exercise Plan   Plans to continue exercise at  -  Home (comment) Walking  Home (comment) Walking     Frequency  -  Add 2 additional days to program exercise sessions.  Add 2 additional days to program exercise sessions.     Initial Home Exercises Provided  -  11/25/18  11/25/18        Exercise Comments: Exercise Comments    Row Name 11/18/18 7628 11/25/18 1010 12/02/18 0950 12/16/18 1015     Exercise Comments  Patient tolerated 1st session of exercise well without  c/o.  Reviewed home exercise gudielines, METs, and goals with patient.  Reviewed METs and goals with patient.  METs reviewed with patient.       Exercise Goals and Review: Exercise Goals    Row Name 11/12/18 0916             Exercise Goals   Increase Physical Activity  Yes       Intervention  Provide advice, education, support and counseling about physical activity/exercise needs.;Develop an individualized exercise prescription for aerobic and resistive training based on initial evaluation findings, risk stratification, comorbidities and participant's personal goals.       Expected Outcomes  Short Term: Attend rehab on a regular basis to increase amount of physical activity.;Long Term: Exercising regularly at least 3-5 days a week.;Long Term: Add in home exercise to make exercise part of routine and to increase amount of physical activity.       Increase Strength and Stamina  Yes       Intervention  Provide advice, education, support and counseling about physical activity/exercise needs.;Develop an individualized exercise prescription for aerobic and resistive training based on initial evaluation findings, risk stratification, comorbidities and participant's personal goals.       Expected Outcomes  Short Term: Increase workloads from initial exercise prescription for resistance, speed, and METs.;Short Term: Perform resistance training exercises routinely during rehab and add in resistance training at home;Long Term: Improve cardiorespiratory fitness, muscular endurance and strength as measured by increased METs and functional capacity (6MWT)       Able to understand and use rate of perceived exertion (RPE) scale  Yes       Intervention  Provide education and explanation on how to use RPE scale       Expected Outcomes  Short Term: Able to use RPE daily in rehab to express subjective intensity level;Long Term:  Able to use RPE to guide intensity level when exercising independently       Knowledge  and understanding of Target Heart Rate Range (THRR)  Yes       Intervention  Provide education and explanation of THRR including how the numbers were predicted and where they are located for reference       Expected Outcomes  Short Term: Able to state/look up THRR;Long Term: Able to use THRR to govern intensity when exercising independently;Short Term: Able to use daily as guideline for intensity in rehab       Able to check pulse independently  Yes       Intervention  Provide education and demonstration on how to check pulse in carotid and radial arteries.;Review the importance of being able to check your own pulse for safety during independent exercise       Expected Outcomes  Short Term: Able to explain why pulse checking is important during independent exercise;Long Term: Able to check pulse independently and accurately       Understanding of Exercise Prescription  Yes       Intervention  Provide education, explanation, and written materials on patient's individual exercise prescription       Expected Outcomes  Short Term: Able to explain program exercise prescription;Long Term: Able to explain home exercise prescription to exercise independently          Exercise Goals Re-Evaluation : Exercise Goals Re-Evaluation    Row Name 11/18/18 0947 11/25/18 1010 12/02/18 0950         Exercise Goal Re-Evaluation   Exercise Goals Review  Increase Physical Activity;Able to understand and use rate of perceived exertion (RPE) scale;Increase Strength and Stamina  Increase Physical Activity;Able to understand and use rate of perceived exertion (RPE) scale;Increase Strength and Stamina;Knowledge and understanding of Target Heart Rate Range (THRR);Understanding of Exercise Prescription  Increase Physical Activity;Able to understand and use rate of perceived exertion (RPE) scale;Increase Strength and Stamina;Knowledge and understanding of Target Heart Rate  Range (THRR);Understanding of Exercise Prescription      Comments  Patient able to understand and use RPE scale appropriately.  Reviewed home exercise guidelines with patient including THRR, RPE scale, and endpoints for exercise. Instructed patient on how to count pulse. Pt plans to walk as her mode of home exercise.  Patient states that she is doing some walking at home but needs to be more consistent. Pt's weight is decreasing, which is one of her goals. Pt states she's still SOB with walking.     Expected Outcomes  Increase workloads as tolerated to help improve stamina and decrease SOB with exertion.  Patient will add 1-2 days walking at home in addition to exercise at cardiac rehab.  Patient will walk at least 1-2 days/week at home to help establish a consistent routine of exercise to help achieve wt loss goals and decresase SOB with exercise.        Discharge Exercise Prescription (Final Exercise Prescription Changes): Exercise Prescription Changes - 12/16/18 0952      Response to Exercise   Blood Pressure (Admit)  122/64    Blood Pressure (Exercise)  124/80    Blood Pressure (Exit)  116/62    Heart Rate (Admit)  87 bpm    Heart Rate (Exercise)  101 bpm    Heart Rate (Exit)  66 bpm    Rating of Perceived Exertion (Exercise)  13    Symptoms  SOB    Duration  Progress to 30 minutes of  aerobic without signs/symptoms of physical distress    Intensity  THRR unchanged      Progression   Progression  Continue to progress workloads to maintain intensity without signs/symptoms of physical distress.    Average METs  2.3      Resistance Training   Training Prescription  Yes    Weight  2lbs    Reps  10-15    Time  10 Minutes      Interval Training   Interval Training  No      Recumbant Bike   Level  1.5   Decreased workloads because difficulty for patient.   Minutes  10    METs  2.3      NuStep   Level  2    SPM  85    Minutes  10    METs  2.7      Track   Laps  5    Minutes  8    METs  1.84      Home Exercise Plan   Plans to  continue exercise at  Home (comment)   Walking   Frequency  Add 2 additional days to program exercise sessions.    Initial Home Exercises Provided  11/25/18       Nutrition:  Target Goals: Understanding of nutrition guidelines, daily intake of sodium 1500mg , cholesterol 200mg , calories 30% from fat and 7% or less from saturated fats, daily to have 5 or more servings of fruits and vegetables.  Biometrics: Pre Biometrics - 11/12/18 0807      Pre Biometrics   Height  5\' 4"  (1.626 m)    Weight  81 kg    Waist Circumference  38 inches    Hip Circumference  44 inches    Waist to Hip Ratio  0.86 %    BMI (Calculated)  30.64    Triceps Skinfold  42 mm    % Body Fat  44.8 %    Grip Strength  22.5 kg  Flexibility  8 in    Single Leg Stand  6.68 seconds        Nutrition Therapy Plan and Nutrition Goals: Nutrition Therapy & Goals - 11/20/18 1036      Nutrition Therapy   Diet  general healthful      Personal Nutrition Goals   Nutrition Goal  Pt to identify and limit food sources of saturated fat, trans fat, refined carbohydrates and sodium    Personal Goal #2  Pt to identify food quantities necessary to achieve weight loss of 6-24 lb at graduation from cardiac rehab.     Personal Goal #3  Pt to build a healthy plate including vegetables, fruits, whole grains, and low-fat dairy products in a heart healthy meal plan    Personal Goal #4  Pt to weigh and measure serving sizes      Intervention Plan   Intervention  Prescribe, educate and counsel regarding individualized specific dietary modifications aiming towards targeted core components such as weight, hypertension, lipid management, diabetes, heart failure and other comorbidities.    Expected Outcomes  Short Term Goal: Understand basic principles of dietary content, such as calories, fat, sodium, cholesterol and nutrients.;Long Term Goal: Adherence to prescribed nutrition plan.       Nutrition Assessments: Nutrition Assessments  - 11/15/18 0908      MEDFICTS Scores   Pre Score  54       Nutrition Goals Re-Evaluation:   Nutrition Goals Re-Evaluation:   Nutrition Goals Discharge (Final Nutrition Goals Re-Evaluation):   Psychosocial: Target Goals: Acknowledge presence or absence of significant depression and/or stress, maximize coping skills, provide positive support system. Participant is able to verbalize types and ability to use techniques and skills needed for reducing stress and depression.  Initial Review & Psychosocial Screening: Initial Psych Review & Screening - 11/12/18 1243      Initial Review   Current issues with  Current Stress Concerns    Source of Stress Concerns  Family    Comments  Has family stressors      Family Dynamics   Good Support System?  Yes   Zoe [name] has her husband children and friends for support   [place]  recently loss son in law to lung cancer      Barriers   Psychosocial barriers to participate in program  The patient should benefit from training in stress management and relaxation.      Screening Interventions   Interventions  Encouraged to exercise;To provide support and resources with identified psychosocial needs    Expected Outcomes  Short Term goal: Utilizing psychosocial counselor, staff and physician to assist with identification of specific Stressors or current issues interfering with healing process. Setting desired goal for each stressor or current issue identified.;Long Term Goal: Stressors or current issues are controlled or eliminated.;Short Term goal: Identification and review with participant of any Quality of Life or Depression concerns found by scoring the questionnaire.       Quality of Life Scores: Quality of Life - 11/12/18 1148      Quality of Life   Select  Quality of Life      Quality of Life Scores   Health/Function Pre  18.43 %    Socioeconomic Pre  27.36 %    Psych/Spiritual Pre  26.57 %    Family Pre  23.4 %    GLOBAL Pre   22.8 %      Scores of 19 and below usually indicate a poorer quality of life  in these areas.  A difference of  2-3 points is a clinically meaningful difference.  A difference of 2-3 points in the total score of the Quality of Life Index has been associated with significant improvement in overall quality of life, self-image, physical symptoms, and general health in studies assessing change in quality of life.  PHQ-9: Recent Review Flowsheet Data    Depression screen [facility] 2/9 11/18/2018   Decreased Interest 0   Down, Depressed, Hopeless 0   PHQ - 2 Score 0     Interpretation of Total Score  Total Score Depression Severity:  1-4 = Minimal depression, 5-9 = Mild depression, 10-14 = Moderate depression, 15-19 = Moderately severe depression, 20-27 = Severe depression   Psychosocial Evaluation and Intervention:   Psychosocial Re-Evaluation: Psychosocial Re-Evaluation    [place] Name 11/28/18 1141 12/26/18 1539           Psychosocial Re-Evaluation   Current issues with  Current Stress Concerns  Current Stress Concerns      Comments  Zoe [name] has a daughter who is going through a divorce currently with three children. Will continue to offer emotional support as needed  Zoe [name] has a daughter who is going through a divorce currently with three children. Will continue to offer emotional support as needed      Interventions  Stress management education  Stress management education      Continue Psychosocial Services   No Follow up required  No Follow up required      Comments  Has family stressors  Has family stressors        Initial Review   Source of Stress Concerns  Family  Retirement/disability         Psychosocial Discharge (Final Psychosocial Re-Evaluation): Psychosocial Re-Evaluation - 12/26/18 1539      Psychosocial Re-Evaluation   Current issues with  Current Stress Concerns    Comments  Zoe [name] has a daughter who is going through a divorce currently with three children. Will continue to  offer emotional support as needed    Interventions  Stress management education    Continue Psychosocial Services   No Follow up required    Comments  Has family stressors      Initial Review   Source of Stress Concerns  Retirement/disability       Vocational Rehabilitation: Provide vocational rehab assistance to qualifying candidates.   Vocational Rehab Evaluation & Intervention: Vocational Rehab - 11/12/18 1249      Initial Vocational Rehab Evaluation & Intervention   Assessment shows need for Vocational Rehabilitation  No   Zoe [name] is retired and does not need voational rehab at this time      Education: Education Goals: Education classes will be provided on a weekly basis, covering required topics. Participant will state understanding/return demonstration of topics presented.  Learning Barriers/Preferences: Learning Barriers/Preferences - 11/12/18 1247      Learning Barriers/Preferences   Learning Barriers  Sight   Wears glasses   Learning Preferences  Written Material;Pictoral;Video       Education Topics: Count Your Pulse:  -Group instruction provided by verbal instruction, demonstration, patient participation and written materials to support subject.  Instructors address importance of being able to find your pulse and how to count your pulse when at home without a heart monitor.  Patients get hands on experience counting their pulse with staff help and individually.   Heart Attack, Angina, and Risk Factor Modification:  -Group instruction provided by verbal instruction, video, and written materials to  support subject.  Instructors address signs and symptoms of angina and heart attacks.    Also discuss risk factors for heart disease and how to make changes to improve heart health risk factors.   Functional Fitness:  -Group instruction provided by verbal instruction, demonstration, patient participation, and written materials to support subject.  Instructors address  safety measures for doing things around the house.  Discuss how to get up and down off the floor, how to pick things up properly, how to safely get out of a chair without assistance, and balance training.   Meditation and Mindfulness:  -Group instruction provided by verbal instruction, patient participation, and written materials to support subject.  Instructor addresses importance of mindfulness and meditation practice to help reduce stress and improve awareness.  Instructor also leads participants through a meditation exercise.    Stretching for Flexibility and Mobility:  -Group instruction provided by verbal instruction, patient participation, and written materials to support subject.  Instructors lead participants through series of stretches that are designed to increase flexibility thus improving mobility.  These stretches are additional exercise for major muscle groups that are typically performed during regular warm up and cool down.   Hands Only CPR:  -Group verbal, video, and participation provides a basic overview of AHA guidelines for community CPR. Role-play of emergencies allow participants the opportunity to practice calling for help and chest compression technique with discussion of AED use.   Hypertension: -Group verbal and written instruction that provides a basic overview of hypertension including the most recent diagnostic guidelines, risk factor reduction with self-care instructions and medication management.    Nutrition I class: Heart Healthy Eating:  -Group instruction provided by PowerPoint slides, verbal discussion, and written materials to support subject matter. The instructor gives an explanation and review of the Therapeutic Lifestyle Changes diet recommendations, which includes a discussion on lipid goals, dietary fat, sodium, fiber, plant stanol/sterol esters, sugar, and the components of a well-balanced, healthy diet.   Nutrition II class: Lifestyle Skills:   -Group instruction provided by PowerPoint slides, verbal discussion, and written materials to support subject matter. The instructor gives an explanation and review of label reading, grocery shopping for heart health, heart healthy recipe modifications, and ways to make healthier choices when eating out.   Diabetes Question & Answer:  -Group instruction provided by PowerPoint slides, verbal discussion, and written materials to support subject matter. The instructor gives an explanation and review of diabetes co-morbidities, pre- and post-prandial blood glucose goals, pre-exercise blood glucose goals, signs, symptoms, and treatment of hypoglycemia and hyperglycemia, and foot care basics.   Diabetes Blitz:  -Group instruction provided by PowerPoint slides, verbal discussion, and written materials to support subject matter. The instructor gives an explanation and review of the physiology behind type 1 and type 2 diabetes, diabetes medications and rational behind using different medications, pre- and post-prandial blood glucose recommendations and Hemoglobin A1c goals, diabetes diet, and exercise including blood glucose guidelines for exercising safely.    Portion Distortion:  -Group instruction provided by PowerPoint slides, verbal discussion, written materials, and food models to support subject matter. The instructor gives an explanation of serving size versus portion size, changes in portions sizes over the last 20 years, and what consists of a serving from each food group.   Stress Management:  -Group instruction provided by verbal instruction, video, and written materials to support subject matter.  Instructors review role of stress in heart disease and how to cope with stress positively.  CARDIAC REHAB PHASE II EXERCISE from 12/04/2018 in [place]  Date  11/20/18  Educator  RN  Instruction Review Code  2- Demonstrated Understanding      Exercising on  Your Own:  -Group instruction provided by verbal instruction, power point, and written materials to support subject.  Instructors discuss benefits of exercise, components of exercise, frequency and intensity of exercise, and end points for exercise.  Also discuss use of nitroglycerin and activating EMS.  Review options of places to exercise outside of rehab.  Review guidelines for sex with heart disease.   Cardiac Drugs I:  -Group instruction provided by verbal instruction and written materials to support subject.  Instructor reviews cardiac drug classes: antiplatelets, anticoagulants, beta blockers, and statins.  Instructor discusses reasons, side effects, and lifestyle considerations for each drug class.   Cardiac Drugs II:  -Group instruction provided by verbal instruction and written materials to support subject.  Instructor reviews cardiac drug classes: angiotensin converting enzyme inhibitors (ACE-I), angiotensin II receptor blockers (ARBs), nitrates, and calcium channel blockers.  Instructor discusses reasons, side effects, and lifestyle considerations for each drug class.   CARDIAC REHAB PHASE II EXERCISE from 12/04/2018 in [place]  Date  12/04/18  Instruction Review Code  2- Demonstrated Understanding      Anatomy and Physiology of the Circulatory System:  Group verbal and written instruction and models provide basic cardiac anatomy and physiology, with the coronary electrical and arterial systems. Review of: AMI, Angina, Valve disease, Heart Failure, Peripheral Artery Disease, Cardiac Arrhythmia, Pacemakers, and the ICD.   Other Education:  -Group or individual verbal, written, or video instructions that support the educational goals of the cardiac rehab program.   Holiday Eating Survival Tips:  -Group instruction provided by PowerPoint slides, verbal discussion, and written materials to support subject matter. The instructor gives patients tips,  tricks, and techniques to help them not only survive but enjoy the holidays despite the onslaught of food that accompanies the holidays.   Knowledge Questionnaire Score: Knowledge Questionnaire Score - 11/12/18 1144      Knowledge Questionnaire Score   Pre Score  22/24       Core Components/Risk Factors/Patient Goals at Admission: Personal Goals and Risk Factors at Admission - 11/12/18 1151      Core Components/Risk Factors/Patient Goals on Admission    Weight Management  Yes;Obesity    Intervention  Weight Management: Develop a combined nutrition and exercise program designed to reach desired caloric intake, while maintaining appropriate intake of nutrient and fiber, sodium and fats, and appropriate energy expenditure required for the weight goal.;Weight Management: Provide education and appropriate resources to help participant work on and attain dietary goals.;Weight Management/Obesity: Establish reasonable short term and long term weight goals.;Obesity: Provide education and appropriate resources to help participant work on and attain dietary goals.    Admit Weight  178 lb 9.2 oz (81 kg)    Goal Weight: Short Term  172 lb (78 kg)    Goal Weight: Long Term  160 lb (72.6 kg)    Expected Outcomes  Short Term: Continue to assess and modify interventions until short term weight is achieved;Long Term: Adherence to nutrition and physical activity/exercise program aimed toward attainment of established weight goal;Weight Loss: Understanding of general recommendations for a balanced deficit meal plan, which promotes 1-2 lb weight loss per week and includes a negative energy balance of [phone] kcal/d;Understanding recommendations for meals to include 15-35% energy as protein, 25-35%  energy from fat, 35-60% energy from carbohydrates, less than 200mg  of dietary cholesterol, 20-35 gm of total fiber daily;Understanding of distribution of calorie intake throughout the day with the consumption of 4-5  meals/snacks    Improve shortness of breath with ADL's  Yes    Intervention  Provide education, individualized exercise plan and daily activity instruction to help decrease symptoms of SOB with activities of daily living.    Expected Outcomes  Short Term: Improve cardiorespiratory fitness to achieve a reduction of symptoms when performing ADLs;Long Term: Be able to perform more ADLs without symptoms or delay the onset of symptoms    Hypertension  Yes    Intervention  Provide education on lifestyle modifcations including regular physical activity/exercise, weight management, moderate sodium restriction and increased consumption of fresh fruit, vegetables, and low fat dairy, alcohol moderation, and smoking cessation.;Monitor prescription use compliance.    Expected Outcomes  Short Term: Continued assessment and intervention until BP is < 140/66mm HG in hypertensive participants. < 130/20mm HG in hypertensive participants with diabetes, heart failure or chronic kidney disease.;Long Term: Maintenance of blood pressure at goal levels.    Lipids  Yes    Intervention  Provide education and support for participant on nutrition & aerobic/resistive exercise along with prescribed medications to achieve LDL 70mg , HDL >40mg .    Expected Outcomes  Short Term: Participant states understanding of desired cholesterol values and is compliant with medications prescribed. Participant is following exercise prescription and nutrition guidelines.;Long Term: Cholesterol controlled with medications as prescribed, with individualized exercise RX and with personalized nutrition plan. Value goals: LDL < 70mg , HDL > 40 mg.    Stress  Yes    Intervention  Offer individual and/or small group education and counseling on adjustment to heart disease, stress management and health-related lifestyle change. Teach and support self-help strategies.;Refer participants experiencing significant psychosocial distress to appropriate mental health  specialists for further evaluation and treatment. When possible, include family members and significant others in education/counseling sessions.    Expected Outcomes  Short Term: Participant demonstrates changes in health-related behavior, relaxation and other stress management skills, ability to obtain effective social support, and compliance with psychotropic medications if prescribed.;Long Term: Emotional wellbeing is indicated by absence of clinically significant psychosocial distress or social isolation.       Core Components/Risk Factors/Patient Goals Review:  Goals and Risk Factor Review    Row Name 11/28/18 1147 11/28/18 1151 12/26/18 1540         Core Components/Risk Factors/Patient Goals Review   Personal Goals Review  Weight Management/Obesity  Weight Management/Obesity;Lipids;Improve shortness of breath with ADL's;Hypertension;Stress  Weight Management/Obesity;Lipids;Improve shortness of breath with ADL's;Hypertension;Stress     Review  -  Zoe [name] has been doing well with exercise encouraged to take rest breaks as needed. Zoe [name]'s vital signs have been stable.  Zoe [name] has been doing well with exercise encouraged to take rest breaks as needed. Zoe [name]'s vital signs have been stable. Zoe [name] is scheduled to see Dr Einar Gip on 01/07/19 as Walker Kehr says her shortness of breath has not imporved since surgery     Expected Outcomes  -  Zoe [name] will continue to participate in cardiac rehab for exercise, nutrition and lifestyle modifications  Zoe [name] will continue to participate in cardiac rehab for exercise, nutrition and lifestyle modifications        Core Components/Risk Factors/Patient Goals at Discharge (Final Review):  Goals and Risk Factor Review - 12/26/18 1540      Core Components/Risk Factors/Patient Goals Review   Personal Goals  Review  Weight Management/Obesity;Lipids;Improve shortness of breath with ADL's;Hypertension;Stress    Review  Zoe [name] has been doing well with exercise encouraged to take  rest breaks as needed. Zoe [name]'s vital signs have been stable. Zoe [name] is scheduled to see Dr Einar Gip on 01/07/19 as Walker Kehr says her shortness of breath has not imporved since surgery    Expected Outcomes  Zoe [name] will continue to participate in cardiac rehab for exercise, nutrition and lifestyle modifications       ITP Comments: ITP Comments    Row Name 11/12/18 0844 11/28/18 1131 12/26/18 1538       ITP Comments  Dr. Fransico Him, Medical Director   30 Day ITP Zoe [name] is off to a good start to exercise. Patient is with good attendance and participation in phase 2 cardiac rehab  30 Day ITP Review. Patient with good participation and attendance in phase 2 cardiac rehab        Comments: See ITP comments.Barnet Pall, RN,BSN 12/26/2018 3:43 PM

## 2018-12-27 ENCOUNTER — Encounter (HOSPITAL_COMMUNITY): Payer: Medicare Other

## 2018-12-30 ENCOUNTER — Encounter (HOSPITAL_COMMUNITY)
Admission: RE | Admit: 2018-12-30 | Discharge: 2018-12-30 | Disposition: A | Discharge status: Home / Self Care | Payer: Medicare Other | Source: Ambulatory Visit | Attending: Cardiology | Admitting: Cardiology

## 2018-12-30 DIAGNOSIS — Z952 Presence of prosthetic heart valve: Secondary | ICD-10-CM

## 2019-01-01 ENCOUNTER — Other Ambulatory Visit: Payer: Self-pay

## 2019-01-01 ENCOUNTER — Encounter (HOSPITAL_COMMUNITY)
Admission: RE | Admit: 2019-01-01 | Discharge: 2019-01-01 | Disposition: A | Discharge status: Home / Self Care | Payer: Medicare Other | Source: Ambulatory Visit | Attending: Cardiology | Admitting: Cardiology

## 2019-01-01 DIAGNOSIS — E039 Hypothyroidism, unspecified: Secondary | ICD-10-CM | POA: Diagnosis not present

## 2019-01-01 DIAGNOSIS — Z0000 Encounter for general adult medical examination without abnormal findings: Secondary | ICD-10-CM | POA: Diagnosis not present

## 2019-01-01 DIAGNOSIS — Z952 Presence of prosthetic heart valve: Secondary | ICD-10-CM | POA: Diagnosis not present

## 2019-01-01 DIAGNOSIS — I10 Essential (primary) hypertension: Secondary | ICD-10-CM | POA: Diagnosis not present

## 2019-01-03 ENCOUNTER — Encounter (HOSPITAL_COMMUNITY): Payer: Medicare Other

## 2019-01-03 NOTE — Progress Notes (Signed)
Subjective:  Primary Physician:  Jani Gravel, MD  Patient ID: Zoe [name], female    DOB: Jun 15, 1934, 85 y.o.   MRN: [id number]  Chief Complaint  Patient presents with  . Shortness of Breath    last EKG 10/03/18  . Hypertension  . Bleeding/Bruising    nose bleeding 2-3 times a day    HPI: Zoe [name]  is a 85 y.o. female  with  history of possibly mild coronary spasm by coronary angiogram in 2014 when she presented with angina pectoris, otherwise no significant CAD, severe AS s/p TAVR by Dr. Roxy Manns on 11/12., asymptomatic left carotid artery stenosis, essential hypertension, hyperlipidemia, and GERD. She made an appointment to see me due to dyspnea which has not improved, it last seen her in January 2020.  She was advised by cardiac rehabilitation to make an appointment.  Denies PND or orthopnea, denies chest pain or palpitations, no leg edema.  Otherwise states that she's feeling well.  Blood pressure in the rehabilitation and also with PCP has been normal.   Past Medical History:  Diagnosis Date  . Arthritis    some - per patient  . Breast cancer ([place])    breast cancer / left   . Cataract    bilat   . GERD (gastroesophageal reflux disease)   . History of kidney stones   . Hyperlipidemia   . Hypertension   . Hypothyroidism   . Macular degeneration    Left  . S/P TAVR (transcatheter aortic valve replacement) 09/03/2018   23 mm Edwards Sapien 3 transcatheter heart valve placed via percutaneous right transfemoral approach   . Severe aortic stenosis   . Stress incontinence   . Thyroid disease   . Tinnitus     Past Surgical History:  Procedure Laterality Date  . ABDOMINAL HYSTERECTOMY  1970's  . BACK SURGERY    . BREAST LUMPECTOMY  12/1998   lumpectomy  . CARDIAC CATHETERIZATION    . EYE SURGERY     cataract surgery bilat   . INTRAOPERATIVE TRANSTHORACIC ECHOCARDIOGRAM N/A 09/03/2018   Procedure: INTRAOPERATIVE TRANSTHORACIC ECHOCARDIOGRAM;  Surgeon:  Burnell Blanks, MD;  Location: [facility];  Service: Open Heart Surgery;  Laterality: N/A;  . LITHOTRIPSY    . Right total knee     2018 Dr. Alvan Dame  . RIGHT/LEFT HEART CATH AND CORONARY ANGIOGRAPHY N/A 08/06/2018   Procedure: RIGHT/LEFT HEART CATH AND CORONARY ANGIOGRAPHY;  Surgeon: Adrian Prows, MD;  Location: [facility];  Service: Cardiovascular;  Laterality: N/A;  . THYROIDECTOMY, PARTIAL  1975  . TONSILLECTOMY     as a child - patient not sure of exact date  . TOTAL KNEE ARTHROPLASTY Left 03/13/2016   Procedure: TOTAL KNEE ARTHROPLASTY;  Surgeon: Paralee Cancel, MD;  Location: [facility];  Service: Orthopedics;  Laterality: Left;  . TOTAL KNEE ARTHROPLASTY Right 06/18/2017   Procedure: RIGHT TOTAL KNEE ARTHROPLASTY;  Surgeon: Paralee Cancel, MD;  Location: [facility];  Service: Orthopedics;  Laterality: Right;  . TRANSCATHETER AORTIC VALVE REPLACEMENT, TRANSFEMORAL N/A 09/03/2018   Procedure: TRANSCATHETER AORTIC VALVE REPLACEMENT, TRANSFEMORAL;  Surgeon: Burnell Blanks, MD;  Location: [facility];  Service: Open Heart Surgery;  Laterality: N/A;    Social History   Socioeconomic History  . Marital status: Married    Spouse name: Not on file  . Number of children: 4  . Years of education: Not on file  . Highest education level: Master's degree (e.g., MA, MS, MEng, MEd, MSW, MBA)  Occupational History  . 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 Financial resource strain: Not very hard  . Food insecurity:    Worry: Never true    Inability: Never true  . Transportation needs:    Medical: No    Non-medical: No  Tobacco Use  . Smoking status: Never Smoker  . Smokeless tobacco: Never Used  Substance and Sexual Activity  . Alcohol use: No  . Drug use: No  . Sexual activity: Not Currently  Lifestyle  . Physical activity:    Days per week: 0 days    Minutes per session: 0 min  . Stress: To some extent  Relationships  . Social  connections:    Talks on phone: Not on file    Gets together: Not on file    Attends religious service: Not on file    Active member of club or organization: Not on file    Attends meetings of clubs or organizations: Not on file    Relationship status: Not on file  . Intimate partner violence:    Fear of current or ex partner: Not on file    Emotionally abused: Not on file    Physically abused: Not on file    Forced sexual activity: Not on file  Other Topics Concern  . Not on file  Social History Narrative  . Not on file    Current Outpatient Medications on File Prior to Visit  Medication Sig Dispense Refill  . Artificial Tear Solution (SOOTHE XP) SOLN Place 1 drop into both eyes every evening.    Marland Kitchen aspirin 81 MG chewable tablet Chew 1 tablet (81 mg total) by mouth daily.    . carvedilol (COREG) 12.5 MG tablet Take 12.5 mg by mouth 2 (two) times daily with a meal.    . Cholecalciferol (VITAMIN D3) 2000 units TABS Take 2,000 Units by mouth daily.    . clindamycin (CLEOCIN) 300 MG capsule Take 2 tabs (600 mg) 1 hour before dental work 2 capsule 4  . clopidogrel (PLAVIX) 75 MG tablet Take 1 tablet (75 mg total) by mouth daily with breakfast. 90 tablet 1  . hydrochlorothiazide (HYDRODIURIL) 25 MG tablet Take 25 mg by mouth daily.  4  . levothyroxine (SYNTHROID, LEVOTHROID) 100 MCG tablet Take 100 mcg by mouth daily before breakfast.  2  . Multiple Vitamins-Minerals (PRESERVISION AREDS 2) CAPS Take 1 tablet by mouth 2 (two) times daily.    . pantoprazole (PROTONIX) 40 MG tablet Take 1 tablet (40 mg total) by mouth daily. 90 tablet 1  . pravastatin (PRAVACHOL) 40 MG tablet Take 40 mg by mouth every evening.    Marland Kitchen losartan (COZAAR) 100 MG tablet Take 100 mg by mouth daily.     No current facility-administered medications on file prior to visit.     Review of Systems  Constitutional: Negative for malaise/fatigue and weight loss.  HENT: Positive for nosebleeds (mild and rare).    Respiratory: Positive for shortness of breath (on exertion). Negative for cough and hemoptysis.   Cardiovascular: Negative for chest pain, palpitations, claudication and leg swelling.  Gastrointestinal: Negative for abdominal pain, blood in stool, constipation, heartburn and vomiting.  Genitourinary: Negative for dysuria.  Musculoskeletal: Positive for back pain and joint pain (both knees). Negative for myalgias.  Neurological: Negative for dizziness, focal weakness and headaches.  Endo/Heme/Allergies: Does not bruise/bleed easily.  Psychiatric/Behavioral: Negative for depression. The patient is not nervous/anxious.   All other systems reviewed and  are negative.      Objective:  Blood pressure (!) 141/62, pulse 70, height _0  (1.626 m), weight 176 lb 14.4 oz (80.2 kg), SpO2 94 %. Body mass index is 30.36 kg/m.  Physical Exam  Constitutional: She appears well-developed. No distress.  Mildly Obese  HENT:  Head: Atraumatic.  Eyes: Conjunctivae are normal.  Neck: Neck supple. No JVD present. No thyromegaly present.  Cardiovascular: Normal rate, regular rhythm and intact distal pulses. Exam reveals no gallop.  Murmur heard.  Early systolic murmur is present with a grade of 1/6. Aortic Area  Pulmonary/Chest: Effort normal. She has decreased breath sounds in the right lower field.  Abdominal: Soft. Bowel sounds are normal.  Musculoskeletal: Normal range of motion.        General: No edema.  Neurological: She is alert.  Skin: Skin is warm and dry.  Psychiatric: She has a normal mood and affect.    CARDIAC STUDIES:   Echocardiogram 11/15/2018:  Left ventricle cavity is normal in size. Moderate concentric hypertrophy of the left ventricle. Normal global wall motion. Doppler evidence of grade II (pseudonormal) diastolic dysfunction, elevated LAP. Calculated EF 73%. Left atrial cavity is severely dilated at 5.1 cm. Bioprosthetic aortic valve with trace regurgitation. Normal aortic  valve leaflet mobility. No evidence of aortic valve stenosis. Aortic valve peak pressure gradient of 23 and mean gradient of 12 mm Hg, calculated aortic valve area cm. The valve opens well and the gradients probably normal for the size of the valve. Mild calcification of the mitral valve annulus. Mild mitral valve leaflet thickening. Mild (Grade I) mitral regurgitation. Compared to 07/02/2018, LA was mildly dilated and TAVR new.  CXR PA/LAT 11/07/2018:  Cardiac shadow is enlarged but stable. Changes of prior TAVR ar again noted. Aortic calcifications are seen. Previously noted left upper lobe pneumonia has resolved in the interval. No focal infiltrate or sizable effusion is seen. Changes of prior vertebral augmentation are noted.  TAVR 09/03/2018: Edwards Sapien 3 THV (size 23 mm, model # 9600TFX, serial # [id number]) by Lilly Cove and Darlina Guys.   Coronary angiogram 08/06/2018: normal coronary arteries. Moderate pulmonary hypertension due to AS with elevated PW. Preserved CO and CI.  Carotid artery duplex 07/02/2018: Stenosis in the left internal carotid artery (16-49%). Antegrade right vertebral artery flow. Antegrade left vertebral artery flow. Follow up in one year is appropriate if clinically indicated. Compared to the study done on 12/06/2017, left ICA stenosis previously was greater than 50%.  Recent Labs:   Lab Results  Component Value Date   HGBA1C 5.5 08/30/2018   CBC Latest Ref Rng & Units 09/04/2018  WBC 4.0 - 10.5 K/uL 8.3  Hemoglobin 12.0 - 15.0 g/dL 9.5(L)  Hematocrit 36.0 - 46.0 % 31.3(L)  Platelets 150 - 400 K/uL 149(L)   CMP Latest Ref Rng & Units 09/04/2018  Glucose 70 - 99 mg/dL 108(H)  BUN 8 - 23 mg/dL 18  Creatinine 0.44 - 1.00 mg/dL 0.75  Sodium 135 - 145 mmol/L 132(L)  Potassium 3.5 - 5.1 mmol/L 4.1  Chloride 98 - 111 mmol/L 99  CO2 22 - 32 mmol/L 28  Calcium 8.9 - 10.3 mg/dL 8.5(L)  Total Protein 6.5 - 8.1 g/dL -  Total Bilirubin 0.3 - 1.2 mg/dL -   Alkaline Phos 38 - 126 U/L -  AST 15 - 41 U/L -  ALT 0 - 44 U/L -  eGFR non Af Amer >32m/min >60    Assessment & Recommendations:  Dyspnea on exertion  TAVR 09/03/2018: Edwards Sapien 3 THV (size 23 mm, model # 9600TFX, serial # [id number])  Essential hypertension  Asymptomatic stenosis of left carotid artery - Plan: PCV CAROTID DUPLEX (BILATERAL)  EKG 10/03/2018: Normal sinus rhythm at rate of 77 bpm, left atrial enlargement, normal axis. Borderline criteria   for LVH. Two PVCs. No significant change from EKG 08/14/2018.  Recommendation:   Mrs. Ellinor Test is a fairly active Caucasian female with history of possibly mild coronary spasm by coronary angiogram in 2014 when she presented with angina pectoris, otherwise no significant CAD, severe AS s/p TAVR by Dr. Roxy Manns on 09/03/2018, asymptomatic left carotid artery stenosis, essential hypertension, hyperlipidemia, and GERD.  It last seen her in January 2020, she made an appointment to see me today at the behest of cardiac rehabilitation nurse due to decreased exercise tolerance and dyspnea.  She had developed post TAVR pneumonia which is resolved by chest x-ray that had performed.  However I'm beginning to wonder if her dyspnea is related to deconditioning versus any kind of scarring although not visible by chest x-ray.  Her symptoms may also be related to diastolic dysfunction.  For now advised her to keep pushing herself to see if symptoms improve over the next 2-3 months, if symptoms persist, would recommend pulmonary evaluation.  Blood pressure is well controlled, Although elevated today, in the cardiac rehabilitation and also with her PCP and at home has been well-controlled hence I did not make any changes. She does have asymptomatic carotid stenosis, she will need surveillance Dopplers. I'll see her back in 6 months unless there are issues than I will see her back sooner. She is aware she will need endocarditis prophylaxis.   She has had occasional nosebleeds, not significant and usually resolved by applying pressure.  Probably related to dry mucous membrane in view of winter and also combination of aspirin and Plavix.  Advised her to use Afrin p.r.n.  Adrian Prows, MD, [facility] 01/08/2019, 6:07 AM [facility] [place] Pager: [phone] Office: [phone] If no answer Cell [phone]

## 2019-01-06 ENCOUNTER — Encounter (HOSPITAL_COMMUNITY): Payer: Medicare Other

## 2019-01-06 ENCOUNTER — Telehealth (HOSPITAL_COMMUNITY): Payer: Self-pay | Admitting: Cardiac Rehabilitation

## 2019-01-06 NOTE — Telephone Encounter (Signed)
Phone call to patient to notify of CR Phase II departmental closing for 2 weeks.  Pt verbalized understanding.  Joann Rion, RN, BSN Cardiac Pulmonary Rehab  

## 2019-01-07 ENCOUNTER — Encounter: Payer: Self-pay | Admitting: Cardiology

## 2019-01-07 ENCOUNTER — Ambulatory Visit: Payer: Medicare Other | Admitting: Cardiology

## 2019-01-07 ENCOUNTER — Other Ambulatory Visit: Payer: Self-pay

## 2019-01-07 VITALS — BP 141/62 | HR 70 | Ht 64.0 in | Wt 176.9 lb

## 2019-01-07 DIAGNOSIS — I6522 Occlusion and stenosis of left carotid artery: Secondary | ICD-10-CM | POA: Diagnosis not present

## 2019-01-07 DIAGNOSIS — Z952 Presence of prosthetic heart valve: Secondary | ICD-10-CM

## 2019-01-07 DIAGNOSIS — R0609 Other forms of dyspnea: Secondary | ICD-10-CM

## 2019-01-07 DIAGNOSIS — I10 Essential (primary) hypertension: Secondary | ICD-10-CM

## 2019-01-08 ENCOUNTER — Encounter: Payer: Self-pay | Admitting: Cardiology

## 2019-01-08 ENCOUNTER — Encounter (HOSPITAL_COMMUNITY): Payer: Medicare Other

## 2019-01-09 ENCOUNTER — Encounter (HOSPITAL_COMMUNITY): Payer: Self-pay | Admitting: *Deleted

## 2019-01-09 DIAGNOSIS — I10 Essential (primary) hypertension: Secondary | ICD-10-CM | POA: Diagnosis not present

## 2019-01-09 DIAGNOSIS — Z952 Presence of prosthetic heart valve: Secondary | ICD-10-CM

## 2019-01-09 DIAGNOSIS — E7800 Pure hypercholesterolemia, unspecified: Secondary | ICD-10-CM | POA: Diagnosis not present

## 2019-01-09 DIAGNOSIS — K219 Gastro-esophageal reflux disease without esophagitis: Secondary | ICD-10-CM | POA: Diagnosis not present

## 2019-01-09 DIAGNOSIS — R05 Cough: Secondary | ICD-10-CM | POA: Diagnosis not present

## 2019-01-10 ENCOUNTER — Encounter (HOSPITAL_COMMUNITY): Payer: Medicare Other

## 2019-01-13 ENCOUNTER — Encounter (HOSPITAL_COMMUNITY): Payer: Medicare Other

## 2019-01-13 ENCOUNTER — Telehealth (HOSPITAL_COMMUNITY): Payer: Self-pay | Admitting: *Deleted

## 2019-01-13 ENCOUNTER — Encounter (HOSPITAL_COMMUNITY): Payer: Self-pay | Admitting: *Deleted

## 2019-01-13 DIAGNOSIS — Z952 Presence of prosthetic heart valve: Secondary | ICD-10-CM

## 2019-01-13 NOTE — Progress Notes (Signed)
Cardiac Individual Treatment Plan  Patient Details  Name: Zoe [name] MRN: [id number] Date of Birth: 11/01/1933 Referring Provider:     CARDIAC REHAB PHASE II ORIENTATION from 11/12/2018 in [place]  Referring Provider  Adrian Prows, MD      Initial Encounter Date:    CARDIAC REHAB PHASE II ORIENTATION from 11/12/2018 in [place]  Date  11/12/18      Visit Diagnosis: 09/03/2018 S/P TAVR (transcatheter aortic valve replacement)  Patient's Home Medications on Admission:  Current Outpatient Medications:  .  Artificial Tear Solution (SOOTHE XP) SOLN, Place 1 drop into both eyes every evening., Disp: , Rfl:  .  aspirin 81 MG chewable tablet, Chew 1 tablet (81 mg total) by mouth daily., Disp: , Rfl:  .  carvedilol (COREG) 12.5 MG tablet, Take 12.5 mg by mouth 2 (two) times daily with a meal., Disp: , Rfl:  .  Cholecalciferol (VITAMIN D3) 2000 units TABS, Take 2,000 Units by mouth daily., Disp: , Rfl:  .  clindamycin (CLEOCIN) 300 MG capsule, Take 2 tabs (600 mg) 1 hour before dental work, Disp: 2 capsule, Rfl: 4 .  clopidogrel (PLAVIX) 75 MG tablet, Take 1 tablet (75 mg total) by mouth daily with breakfast., Disp: 90 tablet, Rfl: 1 .  hydrochlorothiazide (HYDRODIURIL) 25 MG tablet, Take 25 mg by mouth daily., Disp: , Rfl: 4 .  levothyroxine (SYNTHROID, LEVOTHROID) 100 MCG tablet, Take 100 mcg by mouth daily before breakfast., Disp: , Rfl: 2 .  losartan (COZAAR) 100 MG tablet, Take 100 mg by mouth daily., Disp: , Rfl:  .  Multiple Vitamins-Minerals (PRESERVISION AREDS 2) CAPS, Take 1 tablet by mouth 2 (two) times daily., Disp: , Rfl:  .  pantoprazole (PROTONIX) 40 MG tablet, Take 1 tablet (40 mg total) by mouth daily., Disp: 90 tablet, Rfl: 1 .  pravastatin (PRAVACHOL) 40 MG tablet, Take 40 mg by mouth every evening., Disp: , Rfl:   Past Medical History: Past Medical History:  Diagnosis Date  . Arthritis    some -  per patient  . Breast cancer ([place])    breast cancer / left   . Cataract    bilat   . GERD (gastroesophageal reflux disease)   . History of kidney stones   . Hyperlipidemia   . Hypertension   . Hypothyroidism   . Macular degeneration    Left  . S/P TAVR (transcatheter aortic valve replacement) 09/03/2018   23 mm Edwards Sapien 3 transcatheter heart valve placed via percutaneous right transfemoral approach   . Severe aortic stenosis   . Stress incontinence   . Thyroid disease   . Tinnitus     Tobacco Use: Social History   Tobacco Use  Smoking Status Never Smoker  Smokeless Tobacco Never Used    Labs: Recent Review Flowsheet Data    Labs for ITP Cardiac and Pulmonary Rehab Latest Ref Rng & Units 08/06/2018 08/30/2018 09/03/2018 09/03/2018 09/03/2018   Hemoglobin A1c 4.8 - 5.6 % - 5.5 - - -   PHART 7.350 - 7.450 7.361 7.436 - - -   PCO2ART 32.0 - 48.0 mmHg 47.8 37.7 - - -   HCO3 20.0 - 28.0 mmol/L 27.1 25.0 - - -   TCO2 22 - 32 mmol/L 29 - 26 30 30    O2SAT % 95.0 96.9 - - -      Capillary Blood Glucose: No results found for: GLUCAP   Exercise Target Goals: Exercise Program Goal:  Individual exercise prescription set using results from initial 6 min walk test and THRR while considering  patient's activity barriers and safety.   Exercise Prescription Goal: Initial exercise prescription builds to 30-45 minutes a day of aerobic activity, 2-3 days per week.  Home exercise guidelines will be given to patient during program as part of exercise prescription that the participant will acknowledge.  Activity Barriers & Risk Stratification:   6 Minute Walk:   Oxygen Initial Assessment:   Oxygen Re-Evaluation:   Oxygen Discharge (Final Oxygen Re-Evaluation):   Initial Exercise Prescription:   Perform Capillary Blood Glucose checks as needed.  Exercise Prescription Changes: Exercise Prescription Changes    Row Name 11/18/18 [phone] 12/02/18 0952 12/16/18 0952  12/30/18 0952 01/01/19 0954     Response to Exercise   Blood Pressure (Admit)  108/60  122/72  122/64  130/68  124/60   Blood Pressure (Exercise)  158/60  124/78  124/80  152/58  124/62   Blood Pressure (Exit)  122/70  116/64  116/62  128/60  122/68   Heart Rate (Admit)  90 bpm  95 bpm  87 bpm  88 bpm  91 bpm   Heart Rate (Exercise)  110 bpm  109 bpm  101 bpm  111 bpm  110 bpm   Heart Rate (Exit)  83 bpm  96 bpm  66 bpm  80 bpm  80 bpm   Rating of Perceived Exertion (Exercise)  13  13  13  13  13    Symptoms  none  none  SOB  -  -   Duration  Progress to 30 minutes of  aerobic without signs/symptoms of physical distress  Progress to 30 minutes of  aerobic without signs/symptoms of physical distress  Progress to 30 minutes of  aerobic without signs/symptoms of physical distress  Progress to 30 minutes of  aerobic without signs/symptoms of physical distress  Progress to 30 minutes of  aerobic without signs/symptoms of physical distress   Intensity  THRR unchanged  THRR unchanged  THRR unchanged  THRR unchanged  THRR unchanged     Progression   Progression  Continue to progress workloads to maintain intensity without signs/symptoms of physical distress.  Continue to progress workloads to maintain intensity without signs/symptoms of physical distress.  Continue to progress workloads to maintain intensity without signs/symptoms of physical distress.  Continue to progress workloads to maintain intensity without signs/symptoms of physical distress.  Continue to progress workloads to maintain intensity without signs/symptoms of physical distress.   Average METs  2  2.6  2.3  2.7  2.7     Resistance Training   Training Prescription  Yes  Yes  Yes  Yes  No Relaxation day, no weights   Weight  2lbs  2lbs  2lbs  2lbs  -   Reps  10-15  10-15  10-15  10-15  -   Time  10 Minutes  10 Minutes  10 Minutes  10 Minutes  -     Interval Training   Interval Training  No  No  No  No  No     Recumbant Bike   Level   1  2  1.5 Decreased workloads because difficulty for patient.  2  2   Watts  -  -  -  27  27   Minutes  10  10  10  10  10    METs  1.8  2.3  2.3  3.05  3.05     NuStep  Level  1  2  2  2  2    SPM  85  85  85  85  85   Minutes  10  10  10  10  10    METs  2.1  2.7  2.7  2.9  2.8     Track   Laps  5  10  5  7  7    Minutes  8  8  8  8  8    METs  2.09  2.74  1.84  2.23  2.23     Home Exercise Plan   Plans to continue exercise at  -  Home (comment) Walking  Home (comment) Walking  Home (comment) Walking  Home (comment) Walking   Frequency  -  Add 2 additional days to program exercise sessions.  Add 2 additional days to program exercise sessions.  Add 2 additional days to program exercise sessions.  Add 2 additional days to program exercise sessions.   Initial Home Exercises Provided  -  11/25/18  11/25/18  11/25/18  11/25/18      Exercise Comments: Exercise Comments    Row Name 11/18/18 3748 11/25/18 1010 12/02/18 0950 12/16/18 1015 12/30/18 1010   Exercise Comments  Patient tolerated 1st session of exercise well without c/o.  Reviewed home exercise gudielines, METs, and goals with patient.  Reviewed METs and goals with patient.  METs reviewed with patient.  Reviewed METs and goals with patient.      Exercise Goals and Review:   Exercise Goals Re-Evaluation : Exercise Goals Re-Evaluation    Row Name 11/18/18 0947 11/25/18 1010 12/02/18 0950 12/30/18 1010 01/08/19 1049     Exercise Goal Re-Evaluation   Exercise Goals Review  Increase Physical Activity;Able to understand and use rate of perceived exertion (RPE) scale;Increase Strength and Stamina  Increase Physical Activity;Able to understand and use rate of perceived exertion (RPE) scale;Increase Strength and Stamina;Knowledge and understanding of Target Heart Rate Range (THRR);Understanding of Exercise Prescription  Increase Physical Activity;Able to understand and use rate of perceived exertion (RPE) scale;Increase Strength and  Stamina;Knowledge and understanding of Target Heart Rate Range (THRR);Understanding of Exercise Prescription  Increase Physical Activity;Able to understand and use rate of perceived exertion (RPE) scale;Increase Strength and Stamina;Knowledge and understanding of Target Heart Rate Range (THRR);Understanding of Exercise Prescription  -   Comments  Patient able to understand and use RPE scale appropriately.  Reviewed home exercise guidelines with patient including THRR, RPE scale, and endpoints for exercise. Instructed patient on how to count pulse. Pt plans to walk as her mode of home exercise.  Patient states that she is doing some walking at home but needs to be more consistent. Pt's weight is decreasing, which is one of her goals. Pt states she's still SOB with walking.  Patient states she plans to do more walking at home when the weather gets better. Pt not currenlty walking consistently at home.  Temporary department closure due to COVID-19.   Expected Outcomes  Increase workloads as tolerated to help improve stamina and decrease SOB with exertion.  Patient will add 1-2 days walking at home in addition to exercise at cardiac rehab.  Patient will walk at least 1-2 days/week at home to help establish a consistent routine of exercise to help achieve wt loss goals and decresase SOB with exercise.  Increase workloads at cardiac rehab to help increase strength and stamina.  -      Discharge Exercise Prescription (Final Exercise Prescription Changes): Exercise Prescription Changes -  01/01/19 0954      Response to Exercise   Blood Pressure (Admit)  124/60    Blood Pressure (Exercise)  124/62    Blood Pressure (Exit)  122/68    Heart Rate (Admit)  91 bpm    Heart Rate (Exercise)  110 bpm    Heart Rate (Exit)  80 bpm    Rating of Perceived Exertion (Exercise)  13    Duration  Progress to 30 minutes of  aerobic without signs/symptoms of physical distress    Intensity  THRR unchanged      Progression    Progression  Continue to progress workloads to maintain intensity without signs/symptoms of physical distress.    Average METs  2.7      Resistance Training   Training Prescription  No   Relaxation day, no weights   Weight  --    Reps  --    Time  --      Interval Training   Interval Training  No      Recumbant Bike   Level  2    Watts  27    Minutes  10    METs  3.05      NuStep   Level  2    SPM  85    Minutes  10    METs  2.8      Track   Laps  7    Minutes  8    METs  2.23      Home Exercise Plan   Plans to continue exercise at  Home (comment)   Walking   Frequency  Add 2 additional days to program exercise sessions.    Initial Home Exercises Provided  11/25/18       Nutrition:  Target Goals: Understanding of nutrition guidelines, daily intake of sodium 1500mg , cholesterol 200mg , calories 30% from fat and 7% or less from saturated fats, daily to have 5 or more servings of fruits and vegetables.  Biometrics:    Nutrition Therapy Plan and Nutrition Goals: Nutrition Therapy & Goals - 11/20/18 1036      Nutrition Therapy   Diet  general healthful      Personal Nutrition Goals   Nutrition Goal  Pt to identify and limit food sources of saturated fat, trans fat, refined carbohydrates and sodium    Personal Goal #2  Pt to identify food quantities necessary to achieve weight loss of 6-24 lb at graduation from cardiac rehab.     Personal Goal #3  Pt to build a healthy plate including vegetables, fruits, whole grains, and low-fat dairy products in a heart healthy meal plan    Personal Goal #4  Pt to weigh and measure serving sizes      Intervention Plan   Intervention  Prescribe, educate and counsel regarding individualized specific dietary modifications aiming towards targeted core components such as weight, hypertension, lipid management, diabetes, heart failure and other comorbidities.    Expected Outcomes  Short Term Goal: Understand basic principles of  dietary content, such as calories, fat, sodium, cholesterol and nutrients.;Long Term Goal: Adherence to prescribed nutrition plan.       Nutrition Assessments:   Nutrition Goals Re-Evaluation:   Nutrition Goals Re-Evaluation:   Nutrition Goals Discharge (Final Nutrition Goals Re-Evaluation):   Psychosocial: Target Goals: Acknowledge presence or absence of significant depression and/or stress, maximize coping skills, provide positive support system. Participant is able to verbalize types and ability to use techniques and skills needed for reducing  stress and depression.  Initial Review & Psychosocial Screening:   Quality of Life Scores:  Scores of 19 and below usually indicate a poorer quality of life in these areas.  A difference of  2-3 points is a clinically meaningful difference.  A difference of 2-3 points in the total score of the Quality of Life Index has been associated with significant improvement in overall quality of life, self-image, physical symptoms, and general health in studies assessing change in quality of life.  PHQ-9: Recent Review Flowsheet Data    Depression screen [facility] 2/9 11/18/2018   Decreased Interest 0   Down, Depressed, Hopeless 0   PHQ - 2 Score 0     Interpretation of Total Score  Total Score Depression Severity:  1-4 = Minimal depression, 5-9 = Mild depression, 10-14 = Moderate depression, 15-19 = Moderately severe depression, 20-27 = Severe depression   Psychosocial Evaluation and Intervention:   Psychosocial Re-Evaluation: Psychosocial Re-Evaluation    [place] Name 11/28/18 1141 12/26/18 1539 01/09/19 1116         Psychosocial Re-Evaluation   Current issues with  Current Stress Concerns  Current Stress Concerns  Current Stress Concerns     Comments  Donia Guiles has a daughter who is going through a divorce currently with three children. Will continue to offer emotional support as needed  Donia Guiles has a daughter who is going through a divorce currently  with three children. Will continue to offer emotional support as needed  unable to assess as exercise is currently on hold     Interventions  Stress management education  Stress management education  -     Continue Psychosocial Services   No Follow up required  No Follow up required  -     Comments  Has family stressors  Has family stressors  -       Initial Review   Source of Stress Concerns  Family  Retirement/disability  -        Psychosocial Discharge (Final Psychosocial Re-Evaluation): Psychosocial Re-Evaluation - 01/09/19 1116      Psychosocial Re-Evaluation   Current issues with  Current Stress Concerns    Comments  unable to assess as exercise is currently on hold       Vocational Rehabilitation: Provide vocational rehab assistance to qualifying candidates.   Vocational Rehab Evaluation & Intervention:   Education: Education Goals: Education classes will be provided on a weekly basis, covering required topics. Participant will state understanding/return demonstration of topics presented.  Learning Barriers/Preferences:   Education Topics: Count Your Pulse:  -Group instruction provided by verbal instruction, demonstration, patient participation and written materials to support subject.  Instructors address importance of being able to find your pulse and how to count your pulse when at home without a heart monitor.  Patients get hands on experience counting their pulse with staff help and individually.   Heart Attack, Angina, and Risk Factor Modification:  -Group instruction provided by verbal instruction, video, and written materials to support subject.  Instructors address signs and symptoms of angina and heart attacks.    Also discuss risk factors for heart disease and how to make changes to improve heart health risk factors.   Functional Fitness:  -Group instruction provided by verbal instruction, demonstration, patient participation, and written materials to  support subject.  Instructors address safety measures for doing things around the house.  Discuss how to get up and down off the floor, how to pick things up properly, how to safely get out  of a chair without assistance, and balance training.   Meditation and Mindfulness:  -Group instruction provided by verbal instruction, patient participation, and written materials to support subject.  Instructor addresses importance of mindfulness and meditation practice to help reduce stress and improve awareness.  Instructor also leads participants through a meditation exercise.    Stretching for Flexibility and Mobility:  -Group instruction provided by verbal instruction, patient participation, and written materials to support subject.  Instructors lead participants through series of stretches that are designed to increase flexibility thus improving mobility.  These stretches are additional exercise for major muscle groups that are typically performed during regular warm up and cool down.   Hands Only CPR:  -Group verbal, video, and participation provides a basic overview of AHA guidelines for community CPR. Role-play of emergencies allow participants the opportunity to practice calling for help and chest compression technique with discussion of AED use.   Hypertension: -Group verbal and written instruction that provides a basic overview of hypertension including the most recent diagnostic guidelines, risk factor reduction with self-care instructions and medication management.    Nutrition I class: Heart Healthy Eating:  -Group instruction provided by PowerPoint slides, verbal discussion, and written materials to support subject matter. The instructor gives an explanation and review of the Therapeutic Lifestyle Changes diet recommendations, which includes a discussion on lipid goals, dietary fat, sodium, fiber, plant stanol/sterol esters, sugar, and the components of a well-balanced, healthy  diet.   Nutrition II class: Lifestyle Skills:  -Group instruction provided by PowerPoint slides, verbal discussion, and written materials to support subject matter. The instructor gives an explanation and review of label reading, grocery shopping for heart health, heart healthy recipe modifications, and ways to make healthier choices when eating out.   Diabetes Question & Answer:  -Group instruction provided by PowerPoint slides, verbal discussion, and written materials to support subject matter. The instructor gives an explanation and review of diabetes co-morbidities, pre- and post-prandial blood glucose goals, pre-exercise blood glucose goals, signs, symptoms, and treatment of hypoglycemia and hyperglycemia, and foot care basics.   Diabetes Blitz:  -Group instruction provided by PowerPoint slides, verbal discussion, and written materials to support subject matter. The instructor gives an explanation and review of the physiology behind type 1 and type 2 diabetes, diabetes medications and rational behind using different medications, pre- and post-prandial blood glucose recommendations and Hemoglobin A1c goals, diabetes diet, and exercise including blood glucose guidelines for exercising safely.    Portion Distortion:  -Group instruction provided by PowerPoint slides, verbal discussion, written materials, and food models to support subject matter. The instructor gives an explanation of serving size versus portion size, changes in portions sizes over the last 20 years, and what consists of a serving from each food group.   Stress Management:  -Group instruction provided by verbal instruction, video, and written materials to support subject matter.  Instructors review role of stress in heart disease and how to cope with stress positively.     CARDIAC REHAB PHASE II EXERCISE from 12/04/2018 in [place]  Date  11/20/18  Educator  RN  Instruction Review Code  2-  Demonstrated Understanding      Exercising on Your Own:  -Group instruction provided by verbal instruction, power point, and written materials to support subject.  Instructors discuss benefits of exercise, components of exercise, frequency and intensity of exercise, and end points for exercise.  Also discuss use of nitroglycerin and activating EMS.  Review options of places  to exercise outside of rehab.  Review guidelines for sex with heart disease.   Cardiac Drugs I:  -Group instruction provided by verbal instruction and written materials to support subject.  Instructor reviews cardiac drug classes: antiplatelets, anticoagulants, beta blockers, and statins.  Instructor discusses reasons, side effects, and lifestyle considerations for each drug class.   Cardiac Drugs II:  -Group instruction provided by verbal instruction and written materials to support subject.  Instructor reviews cardiac drug classes: angiotensin converting enzyme inhibitors (ACE-I), angiotensin II receptor blockers (ARBs), nitrates, and calcium channel blockers.  Instructor discusses reasons, side effects, and lifestyle considerations for each drug class.   CARDIAC REHAB PHASE II EXERCISE from 12/04/2018 in [place]  Date  12/04/18  Instruction Review Code  2- Demonstrated Understanding      Anatomy and Physiology of the Circulatory System:  Group verbal and written instruction and models provide basic cardiac anatomy and physiology, with the coronary electrical and arterial systems. Review of: AMI, Angina, Valve disease, Heart Failure, Peripheral Artery Disease, Cardiac Arrhythmia, Pacemakers, and the ICD.   Other Education:  -Group or individual verbal, written, or video instructions that support the educational goals of the cardiac rehab program.   Holiday Eating Survival Tips:  -Group instruction provided by PowerPoint slides, verbal discussion, and written materials to support  subject matter. The instructor gives patients tips, tricks, and techniques to help them not only survive but enjoy the holidays despite the onslaught of food that accompanies the holidays.   Knowledge Questionnaire Score:   Core Components/Risk Factors/Patient Goals at Admission:   Core Components/Risk Factors/Patient Goals Review:  Goals and Risk Factor Review    Row Name 11/28/18 1147 11/28/18 1151 12/26/18 1540 01/09/19 1117       Core Components/Risk Factors/Patient Goals Review   Personal Goals Review  Weight Management/Obesity  Weight Management/Obesity;Lipids;Improve shortness of breath with ADL's;Hypertension;Stress  Weight Management/Obesity;Lipids;Improve shortness of breath with ADL's;Hypertension;Stress  Weight Management/Obesity;Lipids;Improve shortness of breath with ADL's;Hypertension;Stress    Review  -  Donia Guiles has been doing well with exercise encouraged to take rest breaks as needed. Ginny's vital signs have been stable.  Donia Guiles has been doing well with exercise encouraged to take rest breaks as needed. Ginny's vital signs have been stable. Donia Guiles is scheduled to see Dr Einar Gip on 01/07/19 as Walker Kehr says her shortness of breath has not imporved since surgery  Exercise at cardiac rehab is currently on hold per recommended guidelines to prevent the spread of COVID-19    Expected Outcomes  -  Donia Guiles will continue to participate in cardiac rehab for exercise, nutrition and lifestyle modifications  Donia Guiles will continue to participate in cardiac rehab for exercise, nutrition and lifestyle modifications  Ginny will continue to participate in cardiac rehab for exercise, nutrition and lifestyle modifications when cardiac rehab resumes       Core Components/Risk Factors/Patient Goals at Discharge (Final Review):  Goals and Risk Factor Review - 01/09/19 1117      Core Components/Risk Factors/Patient Goals Review   Personal Goals Review  Weight Management/Obesity;Lipids;Improve shortness of  breath with ADL's;Hypertension;Stress    Review  Exercise at cardiac rehab is currently on hold per recommended guidelines to prevent the spread of COVID-19    Expected Outcomes  Ginny will continue to participate in cardiac rehab for exercise, nutrition and lifestyle modifications when cardiac rehab resumes       ITP Comments: ITP Comments    Row Name 11/28/18 1131 12/26/18 1538 01/09/19 1115  ITP Comments  30 Day ITP Ginny is off to a good start to exercise. Patient is with good attendance and participation in phase 2 cardiac rehab  30 Day ITP Review. Patient with good participation and attendance in phase 2 cardiac rehab  30 Day ITP Review. Exercise at cardiac rehab is currently on hold per recommended guidelines to prevent the spread of COVID-19        Comments: See ITP comments.Barnet Pall, RN,BSN 01/13/2019 2:44 PM

## 2019-01-15 ENCOUNTER — Encounter (HOSPITAL_COMMUNITY): Payer: Medicare Other

## 2019-01-17 ENCOUNTER — Encounter (HOSPITAL_COMMUNITY): Payer: Medicare Other

## 2019-01-20 ENCOUNTER — Encounter (HOSPITAL_COMMUNITY): Payer: Medicare Other

## 2019-01-22 ENCOUNTER — Encounter (HOSPITAL_COMMUNITY): Payer: Medicare Other

## 2019-01-24 ENCOUNTER — Encounter (HOSPITAL_COMMUNITY): Payer: Medicare Other

## 2019-01-27 ENCOUNTER — Encounter (HOSPITAL_COMMUNITY): Payer: Medicare Other

## 2019-01-28 ENCOUNTER — Other Ambulatory Visit: Payer: Self-pay

## 2019-01-29 ENCOUNTER — Other Ambulatory Visit: Payer: Self-pay

## 2019-01-29 ENCOUNTER — Telehealth (HOSPITAL_COMMUNITY): Payer: Self-pay | Admitting: *Deleted

## 2019-01-29 ENCOUNTER — Encounter (HOSPITAL_COMMUNITY): Payer: Medicare Other

## 2019-01-29 MED ORDER — LOSARTAN POTASSIUM 50 MG PO TABS: 100.0000 mg | ORAL_TABLET | Freq: Every day | ORAL | 3 refills | Status: DC

## 2019-01-29 NOTE — Telephone Encounter (Signed)
Called to notify patient that the cardiac and pulmonary rehabilitation department will be closed temporarily due to COVID-19 restrictions. Pt verbalized understanding.  Pt states that she is doing some walking at home but plans to "do better". Pt is also doing some hand weight exercises at home.  Sol Passer, [place], ACSM CEP 01/29/2019 [phone]

## 2019-01-31 ENCOUNTER — Encounter (HOSPITAL_COMMUNITY): Payer: Medicare Other

## 2019-02-03 ENCOUNTER — Other Ambulatory Visit: Payer: Self-pay

## 2019-02-03 ENCOUNTER — Encounter (HOSPITAL_COMMUNITY): Payer: Medicare Other

## 2019-02-03 ENCOUNTER — Telehealth: Payer: Self-pay

## 2019-02-03 DIAGNOSIS — I10 Essential (primary) hypertension: Secondary | ICD-10-CM

## 2019-02-03 MED ORDER — VALSARTAN 320 MG PO TABS: 320.0000 mg | ORAL_TABLET | Freq: Every day | ORAL | 1 refills | Status: DC

## 2019-02-03 NOTE — Telephone Encounter (Signed)
Called pt awaitng call back

## 2019-02-03 NOTE — Telephone Encounter (Signed)
Valsartan 320 mg daily  

## 2019-02-05 ENCOUNTER — Encounter (HOSPITAL_COMMUNITY): Payer: Medicare Other

## 2019-02-07 ENCOUNTER — Encounter (HOSPITAL_COMMUNITY): Payer: Medicare Other

## 2019-02-10 ENCOUNTER — Encounter (HOSPITAL_COMMUNITY): Payer: Medicare Other

## 2019-02-12 ENCOUNTER — Encounter (HOSPITAL_COMMUNITY): Payer: Medicare Other

## 2019-02-14 ENCOUNTER — Encounter (HOSPITAL_COMMUNITY): Payer: Medicare Other

## 2019-02-17 ENCOUNTER — Encounter (HOSPITAL_COMMUNITY): Payer: Medicare Other

## 2019-02-19 ENCOUNTER — Encounter (HOSPITAL_COMMUNITY): Payer: Medicare Other

## 2019-02-21 ENCOUNTER — Other Ambulatory Visit: Payer: Self-pay | Admitting: Physician Assistant

## 2019-02-25 ENCOUNTER — Other Ambulatory Visit: Payer: Self-pay | Admitting: Cardiology

## 2019-02-25 DIAGNOSIS — I10 Essential (primary) hypertension: Secondary | ICD-10-CM

## 2019-02-25 NOTE — Telephone Encounter (Signed)
Please fill

## 2019-02-27 ENCOUNTER — Encounter (INDEPENDENT_AMBULATORY_CARE_PROVIDER_SITE_OTHER): Payer: Medicare Other | Admitting: Ophthalmology

## 2019-03-18 ENCOUNTER — Telehealth (HOSPITAL_COMMUNITY): Payer: Self-pay | Admitting: *Deleted

## 2019-03-19 ENCOUNTER — Encounter (HOSPITAL_COMMUNITY): Payer: Self-pay

## 2019-03-19 NOTE — Progress Notes (Signed)
Transitioning to virtual cardiac rehab  Dr. Einar Gip   As you are aware our department remains closed to patients due to Covid-19.  We are excited to be able to offer an alternative to traditional onsite Cardiac Rehab while your patient continues to follow Re-Open guidelines.  This is a notification that your patient has been contacted and is very interested in participating in Virtual Cardiac Rehab.  Thank you for your continued support in helping [place] meet the health care needs of our patients.  Tedra Senegal. Support Rep II   Cardiac Rehab staff

## 2019-03-22 ENCOUNTER — Other Ambulatory Visit: Payer: Self-pay | Admitting: Physician Assistant

## 2019-03-24 ENCOUNTER — Telehealth (HOSPITAL_COMMUNITY): Payer: Self-pay

## 2019-03-24 NOTE — Telephone Encounter (Signed)
Attempted to contact pt to schedule a telephone visit for our Virtual Cardiac Rehab, LMTCB. °

## 2019-04-09 ENCOUNTER — Encounter (INDEPENDENT_AMBULATORY_CARE_PROVIDER_SITE_OTHER): Payer: Medicare Other | Admitting: Ophthalmology

## 2019-04-09 ENCOUNTER — Encounter (HOSPITAL_COMMUNITY): Payer: Self-pay | Admitting: *Deleted

## 2019-04-09 ENCOUNTER — Other Ambulatory Visit: Payer: Self-pay

## 2019-04-09 ENCOUNTER — Telehealth (HOSPITAL_COMMUNITY): Payer: Self-pay | Admitting: *Deleted

## 2019-04-09 DIAGNOSIS — H353132 Nonexudative age-related macular degeneration, bilateral, intermediate dry stage: Secondary | ICD-10-CM

## 2019-04-09 DIAGNOSIS — H35372 Puckering of macula, left eye: Secondary | ICD-10-CM

## 2019-04-09 DIAGNOSIS — H43813 Vitreous degeneration, bilateral: Secondary | ICD-10-CM

## 2019-04-09 DIAGNOSIS — Z952 Presence of prosthetic heart valve: Secondary | ICD-10-CM

## 2019-04-09 NOTE — Progress Notes (Signed)
Discharge Progress Report  Patient Details  Name: SONITA MICHIELS MRN: [id number] Date of Birth: 12-06-1933 Referring Provider:     [place] from 11/12/2018 in [place]  Referring Provider  Adrian Prows, MD       Number of Visits: 17  Reason for Discharge:  Early Exit:  Cardiac rehab closed due to [place] 19 pandemic. Wants to participate in virtual cardiac rehab  Smoking History:  Social History   Tobacco Use  Smoking Status Never Smoker  Smokeless Tobacco Never Used    Diagnosis:  09/03/2018 S/P TAVR (transcatheter aortic valve replacement)  ADL UCSD:   Initial Exercise Prescription:   Discharge Exercise Prescription (Final Exercise Prescription Changes):   Functional Capacity:   Psychological, QOL, Others - Outcomes: PHQ 2/9: Depression screen PHQ 2/9 11/18/2018  Decreased Interest 0  Down, Depressed, Hopeless 0  PHQ - 2 Score 0    Quality of Life:   Personal Goals: Goals established at orientation with interventions provided to work toward goal.    Personal Goals Discharge:   Exercise Goals and Review:   Exercise Goals Re-Evaluation:   Nutrition & Weight - Outcomes:    Nutrition:   Nutrition Discharge:   Education Questionnaire Score:  Ginny attended 17 exercise between 11/12/18-01/03/19. Ginny did well with exercise. Donia Guiles did report that she has still experiencing some shortness of breath since her surgery. Dr Einar Gip has been made aware. Cardiac rehab has been closed due to the [place] 19 pandemic. Donia Guiles has decided to be discharged from phase 2 as she is staying at home per [facility] recommendations. Ginny wants to participate in virtual cardiac rehab instead.Barnet Pall, RN,BSN 04/11/2019 8:30 AM

## 2019-04-09 NOTE — Telephone Encounter (Signed)
Spoke with patient interested in virtual cardiac rehab. Zoe [name] is worried about coming outside in crowds so will discharge from phase 2. Pt is interested in participating in Virtual Cardiac Rehab. Pt advised that Virtual Cardiac Rehab is provided at no cost to the patient.  Checklist:  1. Pt has smart device  ie smartphone and/or ipad for downloading an app  Yes 2. Reliable internet/wifi service    Yes 3. Understands how to use their smartphone and navigate within an app.  Yes   Reviewed with pt the scheduling process for virtual cardiac rehab.  Pt verbalized understanding.            Confirm Consent - In the setting of the current Covid19 crisis, you are scheduled for a phone visit with your Cardiac or Pulmonary team member.  Just as we do with many in-gym visits, in order for you to participate in this visit, we must obtain consent.  If you'd like, I can send this to your mychart (if signed up) or email for you to review.  Otherwise, I can obtain your verbal consent now.  By agreeing to a telephone visit, we'd like you to understand that the technology does not allow for your Cardiac or Pulmonary Rehab team member to perform a physical assessment, and thus may limit their ability to fully assess your ability to perform exercise programs. If your provider identifies any concerns that need to be evaluated in person, we will make arrangements to do so.  Finally, though the technology is pretty good, we cannot assure that it will always work on either your or our end and we cannot ensure that we have a secure connection.  Cardiac and Pulmonary Rehab Telehealth visits and "At Home" cardiac and pulmonary rehab are provided at no cost to you.        Are you willing to proceed?"        STAFF: Did the patient verbally acknowledge consent to telehealth visit? Document YES/NO here: Yes     Barnet Pall RN  Cardiac and Pulmonary Rehab Staff       April 09, 2019 4:23

## 2019-04-09 NOTE — Telephone Encounter (Signed)
  Dr.  Einar Gip   As you are aware our department remains closed to patients due to Covid-19.  We are excited to be able to offer an alternative to traditional onsite Cardiac Rehab while your patient continues to follow Re-Open guidelines.  This is a notification that your patient has been contacted and is very interested in participating in Virtual Cardiac Rehab.  Thank you for your continued support in helping [place] meet the health care needs of our patients.  Barnet Pall RN Cardiac Rehab staff

## 2019-04-11 ENCOUNTER — Telehealth (HOSPITAL_COMMUNITY): Payer: Self-pay | Admitting: *Deleted

## 2019-04-11 ENCOUNTER — Encounter (HOSPITAL_COMMUNITY)
Admission: RE | Admit: 2019-04-11 | Discharge: 2019-04-11 | Disposition: A | Discharge status: Home / Self Care | Payer: Self-pay | Source: Ambulatory Visit | Attending: Cardiology | Admitting: Cardiology

## 2019-04-11 NOTE — Telephone Encounter (Signed)
Called and spoke to pt regarding Virtual Cardiac Rehab.  Pt  was able to download the [employer] app on their smart device with no issues. Pt set up their account and received the following welcome message -"Welcome to the [place] and Pulmonary Rehabilitation program. We hope that you will find the exercise program beneficial in your recovery process. Our staff is available to assist with any questions/concerns about your exercise routine. Best wishes". Brief orientation provided to with the advisement to watch the "Intro to Rehab" series located under the Resource tab. Pt verbalized understanding. Will continue to follow and monitor pt progress with feedback as needed.Barnet Pall, RN,BSN 04/11/2019 10:41 AM

## 2019-04-14 ENCOUNTER — Telehealth (HOSPITAL_COMMUNITY): Payer: Self-pay | Admitting: *Deleted

## 2019-04-14 ENCOUNTER — Telehealth: Payer: Self-pay

## 2019-04-14 NOTE — Telephone Encounter (Signed)
Lelan Pons called to advise that  Pt has been having lightheadedness and sob. She wants to be seen by you. Lelan Pons said that she put a note in epic regarding pt's episode of lightheadedness on this past Saturday.//ah

## 2019-04-14 NOTE — Telephone Encounter (Signed)
We can schedule her for OV with orthostatic check with me or AK

## 2019-04-14 NOTE — Telephone Encounter (Signed)
Spoke with patient regarding reporting feeling dizzy in the virtual cardiac rehab APP. Zoe [name] says that she was bending over in her community garden and felt lightheaded. Zoe [name] says that her symptoms soon resolved. Zoe [name] did not take her blood pressure at the time but does plan to start checking her blood pressure on a regular basis as she changed the batteries. Encouraged the patient to make sure that she is drinking enough water. Zoe [name] also says that she continues to experience some shortness of breath intermittently and plans to make an appointment to follow up with Dr Einar Gip in the near future.Will notify Dr Irven Shelling office about the patient's reported symptoms.Barnet Pall, RN,BSN 04/14/2019 10:54 AM

## 2019-04-15 ENCOUNTER — Ambulatory Visit: Payer: Medicare Other | Admitting: Cardiology

## 2019-04-15 ENCOUNTER — Encounter: Payer: Self-pay | Admitting: Cardiology

## 2019-04-15 ENCOUNTER — Other Ambulatory Visit: Payer: Self-pay

## 2019-04-15 VITALS — Temp 98.7°F | Ht 63.0 in | Wt 179.0 lb

## 2019-04-15 DIAGNOSIS — R0609 Other forms of dyspnea: Secondary | ICD-10-CM

## 2019-04-15 DIAGNOSIS — Z952 Presence of prosthetic heart valve: Secondary | ICD-10-CM | POA: Diagnosis not present

## 2019-04-15 DIAGNOSIS — I10 Essential (primary) hypertension: Secondary | ICD-10-CM

## 2019-04-15 DIAGNOSIS — I951 Orthostatic hypotension: Secondary | ICD-10-CM | POA: Diagnosis not present

## 2019-04-15 MED ORDER — HYDROCHLOROTHIAZIDE 12.5 MG PO CAPS: 12.5000 mg | ORAL_CAPSULE | Freq: Every day | ORAL | 1 refills | Status: DC

## 2019-04-15 NOTE — Telephone Encounter (Signed)
Pt aware. Call transferred to BG to schedule.//ah

## 2019-04-15 NOTE — Progress Notes (Signed)
Primary Physician:  Jani Gravel, MD   Patient ID: Zoe [name], female    DOB: 1934-01-13, 85 y.o.   MRN: [id number]  Subjective:    Chief Complaint  Patient presents with  . Hypertension  . Dizziness  . Shortness of Breath  . Follow-up    HPI: Zoe [name]  is a 85 y.o. female  with history of possibly mild coronary spasm by coronary angiogram in 2014 when she presented with angina pectoris, otherwise no significant CAD, severe AS s/p TAVR by Dr. Roxy Manns on 11/12., asymptomatic left carotid artery stenosis, essential hypertension, hyperlipidemia, and GERD.   Patient has noticed dyspnea on exertion and occasional lightheadedness with bending over and made an appt to further discuss. Patient reports that she noticed dyspnea on exertion in Jan 2020 that was attributed to her recent surgery; however, patient felt that this should have improved by now. She has just recently resumed home cardiac rehab. Denies PND or orthopnea, denies chest pain or palpitations, no leg edema.  Otherwise states that she's feeling well.  Blood pressure in the rehabilitation and also with PCP has been normal. She has noticed with bending over to water her plants that when she stands up, she feels a little lightheaded. No syncope.   Past Medical History:  Diagnosis Date  . Arthritis    some - per patient  . Breast cancer ([place])    breast cancer / left   . Cataract    bilat   . GERD (gastroesophageal reflux disease)   . History of kidney stones   . Hyperlipidemia   . Hypertension   . Hypothyroidism   . Macular degeneration    Left  . S/P TAVR (transcatheter aortic valve replacement) 09/03/2018   23 mm Edwards Sapien 3 transcatheter heart valve placed via percutaneous right transfemoral approach   . Severe aortic stenosis   . Stress incontinence   . Thyroid disease   . Tinnitus     Past Surgical History:  Procedure Laterality Date  . ABDOMINAL HYSTERECTOMY  1970's  . BACK SURGERY    .  BREAST LUMPECTOMY  12/1998   lumpectomy  . CARDIAC CATHETERIZATION    . EYE SURGERY     cataract surgery bilat   . INTRAOPERATIVE TRANSTHORACIC ECHOCARDIOGRAM N/A 09/03/2018   Procedure: INTRAOPERATIVE TRANSTHORACIC ECHOCARDIOGRAM;  Surgeon: Burnell Blanks, MD;  Location: [place] [facility];  Service: Open Heart Surgery;  Laterality: N/A;  . LITHOTRIPSY    . Right total knee     2018 Dr. Alvan Dame  . RIGHT/LEFT HEART CATH AND CORONARY ANGIOGRAPHY N/A 08/06/2018   Procedure: RIGHT/LEFT HEART CATH AND CORONARY ANGIOGRAPHY;  Surgeon: Adrian Prows, MD;  Location: [facility];  Service: Cardiovascular;  Laterality: N/A;  . THYROIDECTOMY, PARTIAL  1975  . TONSILLECTOMY     as a child - patient not sure of exact date  . TOTAL KNEE ARTHROPLASTY Left 03/13/2016   Procedure: TOTAL KNEE ARTHROPLASTY;  Surgeon: Paralee Cancel, MD;  Location: [facility];  Service: Orthopedics;  Laterality: Left;  . TOTAL KNEE ARTHROPLASTY Right 06/18/2017   Procedure: RIGHT TOTAL KNEE ARTHROPLASTY;  Surgeon: Paralee Cancel, MD;  Location: [facility];  Service: Orthopedics;  Laterality: Right;  . TRANSCATHETER AORTIC VALVE REPLACEMENT, TRANSFEMORAL N/A 09/03/2018   Procedure: TRANSCATHETER AORTIC VALVE REPLACEMENT, TRANSFEMORAL;  Surgeon: Burnell Blanks, MD;  Location: [facility];  Service: Open Heart Surgery;  Laterality: N/A;    Social History   Socioeconomic History  . Marital status: Married  Spouse name: Not on file  . Number of children: 4  . Years of education: Not on file  . Highest education level: Master's degree (e.g., MA, MS, MEng, MEd, MSW, MBA)  Occupational History  . 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 Financial resource strain: Not very hard  . Food insecurity    Worry: Never true    Inability: Never true  . Transportation needs    Medical: No    Non-medical: No  Tobacco Use  . Smoking status: Never Smoker  . Smokeless tobacco: Never Used  Substance  and Sexual Activity  . Alcohol use: No  . Drug use: No  . Sexual activity: Not Currently  Lifestyle  . Physical activity    Days per week: 0 days    Minutes per session: 0 min  . Stress: To some extent  Relationships  . Social Herbalist on phone: Not on file    Gets together: Not on file    Attends religious service: Not on file    Active member of club or organization: Not on file    Attends meetings of clubs or organizations: Not on file    Relationship status: Not on file  . Intimate partner violence    Fear of current or ex partner: Not on file    Emotionally abused: Not on file    Physically abused: Not on file    Forced sexual activity: Not on file  Other Topics Concern  . Not on file  Social History Narrative  . Not on file    Review of Systems  Constitution: Negative for decreased appetite, malaise/fatigue, weight gain and weight loss.  HENT: Negative for nosebleeds.   Eyes: Negative for visual disturbance.  Cardiovascular: Positive for dyspnea on exertion. Negative for chest pain, claudication, leg swelling, orthopnea, palpitations and syncope.  Respiratory: Negative for hemoptysis and wheezing.   Endocrine: Negative for cold intolerance and heat intolerance.  Hematologic/Lymphatic: Does not bruise/bleed easily.  Skin: Negative for nail changes.  Musculoskeletal: Positive for back pain and joint pain. Negative for muscle weakness and myalgias.  Gastrointestinal: Negative for abdominal pain, change in bowel habit, nausea and vomiting.  Neurological: Positive for dizziness. Negative for difficulty with concentration, focal weakness and headaches.  Psychiatric/Behavioral: Negative for altered mental status and suicidal ideas.  All other systems reviewed and are negative.     Objective:  Temperature 98.7 F (37.1 C), height 5\' 3"  (1.6 m), weight 179 lb (81.2 kg), SpO2 98 %. Body mass index is 31.71 kg/m.   BP supine: 101/61 BP sitting: 123/75 BP  standing: 104/64     Physical Exam  Constitutional: She is oriented to person, place, and time. Vital signs are normal. She appears well-developed and well-nourished.  HENT:  Head: Normocephalic and atraumatic.  Neck: Normal range of motion.  Cardiovascular: Normal rate, regular rhythm and intact distal pulses.  Murmur heard.  Early systolic murmur is present with a grade of 1/6 at the upper right sternal border. Pulmonary/Chest: Effort normal and breath sounds normal. No accessory muscle usage. No respiratory distress.  Abdominal: Soft. Bowel sounds are normal.  Musculoskeletal: Normal range of motion.  Neurological: She is alert and oriented to person, place, and time.  Skin: Skin is warm and dry.  Vitals reviewed.  Radiology: No results found.  Laboratory examination:    CMP Latest Ref Rng & Units 09/04/2018 09/03/2018 09/03/2018  Glucose 70 - 99 mg/dL 108(H) 127(H)  132(H)  BUN 8 - 23 mg/dL 18 17 18   Creatinine 0.44 - 1.00 mg/dL 0.75 0.60 0.60  Sodium 135 - 145 mmol/L 132(L) 138 138  Potassium 3.5 - 5.1 mmol/L 4.1 4.0 4.0  Chloride 98 - 111 mmol/L 99 99 99  CO2 22 - 32 mmol/L 28 - -  Calcium 8.9 - 10.3 mg/dL 8.5(L) - -  Total Protein 6.5 - 8.1 g/dL - - -  Total Bilirubin 0.3 - 1.2 mg/dL - - -  Alkaline Phos 38 - 126 U/L - - -  AST 15 - 41 U/L - - -  ALT 0 - 44 U/L - - -   CBC Latest Ref Rng & Units 09/04/2018 09/03/2018 09/03/2018  WBC 4.0 - 10.5 K/uL 8.3 - -  Hemoglobin 12.0 - 15.0 g/dL 9.5(L) 10.9(L) 10.5(L)  Hematocrit 36.0 - 46.0 % 31.3(L) 32.0(L) 31.0(L)  Platelets 150 - 400 K/uL 149(L) - -   Lipid Panel  No results found for: CHOL, TRIG, HDL, CHOLHDL, VLDL, LDLCALC, LDLDIRECT HEMOGLOBIN A1C Lab Results  Component Value Date   HGBA1C 5.5 08/30/2018   MPG 111.15 08/30/2018   TSH No results for input(s): TSH in the last 8760 hours.  PRN Meds:. Medications Discontinued During This Encounter  Medication Reason  . clopidogrel (PLAVIX) 75 MG tablet Error   . losartan (COZAAR) 50 MG tablet Error  . hydrochlorothiazide (HYDRODIURIL) 25 MG tablet Dose change   Current Meds  Medication Sig  . Artificial Tear Solution (SOOTHE XP) SOLN Place 1 drop into both eyes every evening.  Marland Kitchen aspirin 81 MG chewable tablet Chew 1 tablet (81 mg total) by mouth daily.  . carvedilol (COREG) 12.5 MG tablet Take 12.5 mg by mouth 2 (two) times daily with a meal.  . Cholecalciferol (VITAMIN D3) 2000 units TABS Take 2,000 Units by mouth daily.  . clindamycin (CLEOCIN) 300 MG capsule Take 2 tabs (600 mg) 1 hour before dental work  . levothyroxine (SYNTHROID, LEVOTHROID) 100 MCG tablet Take 100 mcg by mouth daily before breakfast.  . Multiple Vitamins-Minerals (PRESERVISION AREDS 2) CAPS Take 1 tablet by mouth 2 (two) times daily.  . pantoprazole (PROTONIX) 40 MG tablet TAKE 1 TABLET BY MOUTH EVERY DAY  . pravastatin (PRAVACHOL) 40 MG tablet Take 40 mg by mouth every evening.  . valsartan (DIOVAN) 320 MG tablet TAKE 1 TABLET BY MOUTH EVERY DAY  . [DISCONTINUED] hydrochlorothiazide (HYDRODIURIL) 25 MG tablet Take 25 mg by mouth daily.    Cardiac Studies:   Echocardiogram 11/15/2018:  Left ventricle cavity is normal in size. Moderate concentric hypertrophy of the left ventricle. Normal global wall motion. Doppler evidence of grade II (pseudonormal) diastolic dysfunction, elevated LAP. Calculated EF 73%. Left atrial cavity is severely dilated at 5.1 cm. Bioprosthetic aortic valve with trace regurgitation. Normal aortic valve leaflet mobility. No evidence of aortic valve stenosis. Aortic valve peak pressure gradient of 23 and mean gradient of 12 mm Hg, calculated aortic valve area cm. The valve opens well and the gradients probably normal for the size of the valve. Mild calcification of the mitral valve annulus. Mild mitral valve leaflet thickening. Mild (Grade I) mitral regurgitation. Compared to 07/02/2018, LA was mildly dilated and TAVR new.  CXR PA/LAT  11/07/2018:  Cardiac shadow is enlarged but stable. Changes of prior TAVR ar again noted. Aortic calcifications are seen. Previously noted left upper lobe pneumonia has resolved in the interval. No focal infiltrate or sizable effusion is seen. Changes of prior vertebral augmentation are noted.  TAVR  11/12/2019Oletta Lamas Sapien 3 THV (size 23 mm, model # 9600TFX, serial # [id number]) by Lilly Cove and Darlina Guys.   Coronary angiogram 08/06/2018: normal coronary arteries. Moderate pulmonary hypertension due to AS with elevated PW. Preserved CO and CI.  Carotid artery duplex 07/02/2018: Stenosis in the left internal carotid artery (16-49%). Antegrade right vertebral artery flow. Antegrade left vertebral artery flow. Follow up in one year is appropriate if clinically indicated. Compared to the study done on 12/06/2017, left ICA stenosis previously was greater than 50%.  Assessment:     ICD-10-CM   1. Dyspnea on exertion  R06.09   2. TAVR 09/03/2018: Edwards Sapien 3 THV (size 23 mm, model # 9600TFX, serial # [id number])  Z95.2   3. Essential hypertension  I10   4. Orthostatic hypotension  I95.1     EKG 10/03/2018: Normal sinus rhythm at rate of 77 bpm, left atrial enlargement, normal axis. Borderline criteria   for LVH. Two PVCs. No significant change from EKG 08/14/2018.  Recommendations:   Zoe [name] is a fairly active Caucasian female with history of possibly mild coronary spasm by coronary angiogram in 2014 when she presented with angina pectoris, otherwise no significant CAD, severe AS s/p TAVR by Dr. Roxy Manns on 09/03/2018, asymptomatic left carotid artery stenosis, essential hypertension, hyperlipidemia, and GERD.  Patient has continued to have dyspnea on exertion since her surgery.  Physical exam is unremarkable, no evidence of heart failure.  I suspect her dyspnea on exertion is related to deconditioning and possibly contributed by some diastolic dysfunction.  I have encouraged  her to continue to work with cardiac rehab and to push herself to improve her exercise tolerance.  I believe that her dyspnea on exertion will continue to improve over time with her continued efforts.  In regards to her episodes of dizziness, symptoms are suggestive of orthostatic hypotension.  She is borderline orthostatic today in our office.  We will try decreasing her dose of hydrochlorothiazide to 12.5 mg daily as her blood pressure is very well controlled.  I have encouraged her to ensure that she is staying well-hydrated and to also use support stockings to help with this.  She will be given a prescription for support stockings today.  Otherwise, she continues to recover from surgery well and I am overall pleased with her progress.  We will see her back as previously scheduled for routine follow-up.   *I have discussed this case with Dr. Einar Gip and he personally examined the patient and participated in formulating the plan.*   Miquel Dunn, MSN, APRN, FNP-C [facility]. [place] Office: [phone] Fax: [phone]

## 2019-04-15 NOTE — Patient Instructions (Signed)

## 2019-04-17 ENCOUNTER — Encounter: Payer: Self-pay | Admitting: Cardiology

## 2019-04-19 DIAGNOSIS — N390 Urinary tract infection, site not specified: Secondary | ICD-10-CM | POA: Diagnosis not present

## 2019-05-15 DIAGNOSIS — M4316 Spondylolisthesis, lumbar region: Secondary | ICD-10-CM | POA: Diagnosis not present

## 2019-05-15 DIAGNOSIS — M5136 Other intervertebral disc degeneration, lumbar region: Secondary | ICD-10-CM | POA: Diagnosis not present

## 2019-05-15 DIAGNOSIS — I10 Essential (primary) hypertension: Secondary | ICD-10-CM | POA: Diagnosis not present

## 2019-05-15 DIAGNOSIS — M545 Low back pain: Secondary | ICD-10-CM | POA: Diagnosis not present

## 2019-05-15 DIAGNOSIS — M47816 Spondylosis without myelopathy or radiculopathy, lumbar region: Secondary | ICD-10-CM | POA: Diagnosis not present

## 2019-05-23 ENCOUNTER — Other Ambulatory Visit: Payer: Self-pay | Admitting: Surgery

## 2019-05-23 DIAGNOSIS — Z1231 Encounter for screening mammogram for malignant neoplasm of breast: Secondary | ICD-10-CM

## 2019-05-26 ENCOUNTER — Other Ambulatory Visit: Payer: Self-pay | Admitting: Cardiology

## 2019-05-26 DIAGNOSIS — I10 Essential (primary) hypertension: Secondary | ICD-10-CM

## 2019-05-28 DIAGNOSIS — M5136 Other intervertebral disc degeneration, lumbar region: Secondary | ICD-10-CM | POA: Diagnosis not present

## 2019-05-28 DIAGNOSIS — M545 Low back pain: Secondary | ICD-10-CM | POA: Diagnosis not present

## 2019-05-28 DIAGNOSIS — M47816 Spondylosis without myelopathy or radiculopathy, lumbar region: Secondary | ICD-10-CM | POA: Diagnosis not present

## 2019-05-28 DIAGNOSIS — M4316 Spondylolisthesis, lumbar region: Secondary | ICD-10-CM | POA: Diagnosis not present

## 2019-05-29 DIAGNOSIS — R350 Frequency of micturition: Secondary | ICD-10-CM | POA: Diagnosis not present

## 2019-06-03 ENCOUNTER — Encounter (HOSPITAL_COMMUNITY): Payer: Self-pay

## 2019-06-03 NOTE — Progress Notes (Signed)
Letter sent to Pt due to inactivity with virtual CR app. Pt has not been logging exercise. If Pt does not respond by 06/17/19 will be dropped from the virtual CR app.   Deitra Mayo BS, ACSM CEP 06/03/2019 9:09 AM

## 2019-06-04 DIAGNOSIS — M4316 Spondylolisthesis, lumbar region: Secondary | ICD-10-CM | POA: Diagnosis not present

## 2019-06-04 DIAGNOSIS — M545 Low back pain: Secondary | ICD-10-CM | POA: Diagnosis not present

## 2019-06-05 DIAGNOSIS — R350 Frequency of micturition: Secondary | ICD-10-CM | POA: Diagnosis not present

## 2019-06-06 DIAGNOSIS — M545 Low back pain: Secondary | ICD-10-CM | POA: Diagnosis not present

## 2019-06-06 DIAGNOSIS — S32010A Wedge compression fracture of first lumbar vertebra, initial encounter for closed fracture: Secondary | ICD-10-CM | POA: Diagnosis not present

## 2019-06-10 ENCOUNTER — Telehealth (HOSPITAL_COMMUNITY): Payer: Self-pay | Admitting: Internal Medicine

## 2019-06-11 ENCOUNTER — Telehealth (HOSPITAL_COMMUNITY): Payer: Self-pay

## 2019-06-11 NOTE — Telephone Encounter (Signed)
Phone call returned to Pt to inquire about inactivity in the virtual CR app. Pt did not answer. VM was left for Pt to return call.

## 2019-06-12 ENCOUNTER — Telehealth (HOSPITAL_COMMUNITY): Payer: Self-pay | Admitting: Internal Medicine

## 2019-06-25 DIAGNOSIS — M545 Low back pain: Secondary | ICD-10-CM | POA: Diagnosis not present

## 2019-06-25 DIAGNOSIS — S32010A Wedge compression fracture of first lumbar vertebra, initial encounter for closed fracture: Secondary | ICD-10-CM | POA: Diagnosis not present

## 2019-07-01 ENCOUNTER — Other Ambulatory Visit: Payer: Self-pay

## 2019-07-01 ENCOUNTER — Ambulatory Visit (INDEPENDENT_AMBULATORY_CARE_PROVIDER_SITE_OTHER): Payer: Medicare Other

## 2019-07-01 ENCOUNTER — Other Ambulatory Visit: Payer: Self-pay | Admitting: Cardiology

## 2019-07-01 DIAGNOSIS — I6522 Occlusion and stenosis of left carotid artery: Secondary | ICD-10-CM

## 2019-07-01 DIAGNOSIS — I6523 Occlusion and stenosis of bilateral carotid arteries: Secondary | ICD-10-CM

## 2019-07-09 ENCOUNTER — Other Ambulatory Visit: Payer: Self-pay

## 2019-07-09 ENCOUNTER — Ambulatory Visit
Admission: RE | Admit: 2019-07-09 | Discharge: 2019-07-09 | Disposition: A | Discharge status: Home / Self Care | Payer: Medicare Other | Source: Ambulatory Visit | Attending: Surgery | Admitting: Surgery

## 2019-07-09 DIAGNOSIS — Z1231 Encounter for screening mammogram for malignant neoplasm of breast: Secondary | ICD-10-CM

## 2019-07-10 ENCOUNTER — Ambulatory Visit: Payer: Medicare Other | Admitting: Cardiology

## 2019-07-10 ENCOUNTER — Encounter: Payer: Self-pay | Admitting: Cardiology

## 2019-07-10 VITALS — BP 119/76 | HR 73 | Temp 98.7°F | Ht 64.0 in | Wt 174.0 lb

## 2019-07-10 DIAGNOSIS — E7800 Pure hypercholesterolemia, unspecified: Secondary | ICD-10-CM | POA: Diagnosis not present

## 2019-07-10 DIAGNOSIS — R739 Hyperglycemia, unspecified: Secondary | ICD-10-CM | POA: Diagnosis not present

## 2019-07-10 DIAGNOSIS — I6523 Occlusion and stenosis of bilateral carotid arteries: Secondary | ICD-10-CM

## 2019-07-10 DIAGNOSIS — Z952 Presence of prosthetic heart valve: Secondary | ICD-10-CM

## 2019-07-10 DIAGNOSIS — I10 Essential (primary) hypertension: Secondary | ICD-10-CM | POA: Diagnosis not present

## 2019-07-10 NOTE — Progress Notes (Signed)
Primary Physician:  Jani Gravel, MD   Patient ID: Zoe [name], female    DOB: 25-Jun-1934, 85 y.o.   MRN: [id number]  Subjective:    Chief Complaint  Patient presents with  . Hypertension  . Follow-up    6 month    HPI: [name]  is a 85 y.o. female  with history of possibly mild coronary spasm by coronary angiogram in 2014 when she presented with angina pectoris, otherwise no significant CAD, severe AS s/p TAVR by Dr. Roxy Manns on 11/12., asymptomatic carotid artery stenosis, essential hypertension, hyperlipidemia, and GERD.   Patient is very anxious, gender and carotid duplex and presents for follow-up.  She is presently doing well except for chronic dyspnea, I had previously reassured her that given normal coronary arteries and also aortic valve function be normal by recent echocardiogram that she should push herself to doing more.  She is now exercising on a regular basis and dyspnea has improved.  No leg edema, no PND or orthopnea.  Denies TIA, denies headache, visual disturbances.  Past Medical History:  Diagnosis Date  . Arthritis    some - per patient  . Breast cancer ([place])    breast cancer / left   . Cataract    bilat   . GERD (gastroesophageal reflux disease)   . History of kidney stones   . Hyperlipidemia   . Hypertension   . Hypothyroidism   . Macular degeneration    Left  . S/P TAVR (transcatheter aortic valve replacement) 09/03/2018   23 mm Edwards Sapien 3 transcatheter heart valve placed via percutaneous right transfemoral approach   . Severe aortic stenosis   . Stress incontinence   . Thyroid disease   . Tinnitus     Past Surgical History:  Procedure Laterality Date  . ABDOMINAL HYSTERECTOMY  1970's  . BACK SURGERY    . BREAST LUMPECTOMY  12/1998   lumpectomy  . CARDIAC CATHETERIZATION    . EYE SURGERY     cataract surgery bilat   . INTRAOPERATIVE TRANSTHORACIC ECHOCARDIOGRAM N/A 09/03/2018   Procedure: INTRAOPERATIVE TRANSTHORACIC  ECHOCARDIOGRAM;  Surgeon: Burnell Blanks, MD;  Location: [facility];  Service: Open Heart Surgery;  Laterality: N/A;  . LITHOTRIPSY    . Right total knee     2018 Dr. Alvan Dame  . RIGHT/LEFT HEART CATH AND CORONARY ANGIOGRAPHY N/A 08/06/2018   Procedure: RIGHT/LEFT HEART CATH AND CORONARY ANGIOGRAPHY;  Surgeon: Adrian Prows, MD;  Location: [facility];  Service: Cardiovascular;  Laterality: N/A;  . THYROIDECTOMY, PARTIAL  1975  . TONSILLECTOMY     as a child - patient not sure of exact date  . TOTAL KNEE ARTHROPLASTY Left 03/13/2016   Procedure: TOTAL KNEE ARTHROPLASTY;  Surgeon: Paralee Cancel, MD;  Location: [facility];  Service: Orthopedics;  Laterality: Left;  . TOTAL KNEE ARTHROPLASTY Right 06/18/2017   Procedure: RIGHT TOTAL KNEE ARTHROPLASTY;  Surgeon: Paralee Cancel, MD;  Location: [facility];  Service: Orthopedics;  Laterality: Right;  . TRANSCATHETER AORTIC VALVE REPLACEMENT, TRANSFEMORAL N/A 09/03/2018   Procedure: TRANSCATHETER AORTIC VALVE REPLACEMENT, TRANSFEMORAL;  Surgeon: Burnell Blanks, MD;  Location: [facility];  Service: Open Heart Surgery;  Laterality: N/A;    Social History   Socioeconomic History  . Marital status: Married    Spouse name: Not on file  . Number of children: 4  . Years of education: Not on file  . Highest education level: Master's degree (e.g., MA, MS, MEng, MEd, MSW, MBA)  Occupational History  . 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 Financial resource strain: Not very hard  . Food insecurity    Worry: Never true    Inability: Never true  . Transportation needs    Medical: No    Non-medical: No  Tobacco Use  . Smoking status: Never Smoker  . Smokeless tobacco: Never Used  Substance and Sexual Activity  . Alcohol use: No  . Drug use: No  . Sexual activity: Not Currently  Lifestyle  . Physical activity    Days per week: 0 days    Minutes per session: 0 min  . Stress: To some extent   Relationships  . Social Herbalist on phone: Not on file    Gets together: Not on file    Attends religious service: Not on file    Active member of club or organization: Not on file    Attends meetings of clubs or organizations: Not on file    Relationship status: Not on file  . Intimate partner violence    Fear of current or ex partner: Not on file    Emotionally abused: Not on file    Physically abused: Not on file    Forced sexual activity: Not on file  Other Topics Concern  . Not on file  Social History Narrative  . Not on file    Review of Systems  Constitution: Negative for decreased appetite, malaise/fatigue, weight gain and weight loss.  HENT: Negative for nosebleeds.   Eyes: Negative for visual disturbance.  Cardiovascular: Positive for dyspnea on exertion. Negative for chest pain, claudication, leg swelling, orthopnea, palpitations and syncope.  Respiratory: Negative for hemoptysis and wheezing.   Endocrine: Negative for cold intolerance and heat intolerance.  Hematologic/Lymphatic: Does not bruise/bleed easily.  Skin: Negative for nail changes.  Musculoskeletal: Positive for back pain and joint pain. Negative for muscle weakness and myalgias.  Gastrointestinal: Negative for abdominal pain, change in bowel habit, nausea and vomiting.  Neurological: Negative for difficulty with concentration, dizziness, focal weakness and headaches.  Psychiatric/Behavioral: Negative for altered mental status and suicidal ideas.  All other systems reviewed and are negative.     Objective:  Blood pressure 119/76, pulse 73, temperature 98.7 F (37.1 C), height _0  (1.626 m), weight 174 lb (78.9 kg), SpO2 96 %. Body mass index is 29.87 kg/m.    Physical Exam  Constitutional: She is oriented to person, place, and time. Vital signs are normal. She appears well-developed and well-nourished.  HENT:  Head: Normocephalic and atraumatic.  Neck: Normal range of motion.   Cardiovascular: Normal rate, regular rhythm and intact distal pulses.  Murmur heard.  Early systolic murmur is present with a grade of 2/6 at the upper right sternal border. Pulmonary/Chest: Effort normal and breath sounds normal. No accessory muscle usage. No respiratory distress.  Abdominal: Soft. Bowel sounds are normal.  Musculoskeletal: Normal range of motion.  Neurological: She is alert and oriented to person, place, and time.  Skin: Skin is warm and dry.  Vitals reviewed.  Radiology: Mm 3d Screen Breast Bilateral  Result Date: 07/09/2019 CLINICAL DATA:  Screening. EXAM: DIGITAL SCREENING BILATERAL MAMMOGRAM WITH TOMO AND CAD COMPARISON:  Previous exam(s). ACR Breast Density Category b: There are scattered areas of fibroglandular density. FINDINGS: There are no findings suspicious for malignancy. Images were processed with CAD. IMPRESSION: No mammographic evidence of malignancy. A result letter of this screening mammogram will be mailed directly to  the patient. RECOMMENDATION: Screening mammogram in one year. (Code:SM-B-01Y) BI-RADS CATEGORY  1: Negative. Electronically Signed   By: Lajean Manes M.D.   On: 07/09/2019 15:02    Laboratory examination:   CMP Latest Ref Rng & Units 09/04/2018 09/03/2018 09/03/2018  Glucose 70 - 99 mg/dL 108(H) 127(H) 132(H)  BUN 8 - 23 mg/dL _0 Creatinine 0.44 - 1.00 mg/dL 0.75 0.60 0.60  Sodium 135 - 145 mmol/L 132(L) 138 138  Potassium 3.5 - 5.1 mmol/L 4.1 4.0 4.0  Chloride 98 - 111 mmol/L 99 99 99  CO2 22 - 32 mmol/L 28 - -  Calcium 8.9 - 10.3 mg/dL 8.5(L) - -  Total Protein 6.5 - 8.1 g/dL - - -  Total Bilirubin 0.3 - 1.2 mg/dL - - -  Alkaline Phos 38 - 126 U/L - - -  AST 15 - 41 U/L - - -  ALT 0 - 44 U/L - - -   CBC Latest Ref Rng & Units 09/04/2018 09/03/2018 09/03/2018  WBC 4.0 - 10.5 K/uL 8.3 - -  Hemoglobin 12.0 - 15.0 g/dL 9.5(L) 10.9(L) 10.5(L)  Hematocrit 36.0 - 46.0 % 31.3(L) 32.0(L) 31.0(L)  Platelets 150 - 400 K/uL 149(L)  - -   Lipid Panel  No results found for: CHOL, TRIG, HDL, CHOLHDL, VLDL, LDLCALC, LDLDIRECT HEMOGLOBIN A1C Lab Results  Component Value Date   HGBA1C 5.5 08/30/2018   MPG 111.15 08/30/2018   TSH No results for input(s): TSH in the last 8760 hours.  PRN Meds:. Medications Discontinued During This Encounter  Medication Reason  . clindamycin (CLEOCIN) 300 MG capsule   . hydrochlorothiazide (MICROZIDE) 12.5 MG capsule   . Multiple Vitamins-Minerals (PRESERVISION AREDS 2) CAPS    Current Meds  Medication Sig  . Artificial Tear Solution (SOOTHE XP) SOLN Place 1 drop into both eyes every evening.  Marland Kitchen aspirin 81 MG chewable tablet Chew 1 tablet (81 mg total) by mouth daily.  . carvedilol (COREG) 12.5 MG tablet Take 12.5 mg by mouth 2 (two) times daily with a meal.  . Cholecalciferol (VITAMIN D3) 2000 units TABS Take 2,000 Units by mouth daily.  . hydrochlorothiazide (MICROZIDE) 12.5 MG capsule Take 6.25 mg by mouth daily.  Marland Kitchen levothyroxine (SYNTHROID, LEVOTHROID) 100 MCG tablet Take 100 mcg by mouth daily before breakfast.  . mirabegron ER (MYRBETRIQ) 25 MG TB24 tablet Take 25 mg by mouth daily.  . pantoprazole (PROTONIX) 40 MG tablet TAKE 1 TABLET BY MOUTH EVERY DAY  . pravastatin (PRAVACHOL) 40 MG tablet Take 40 mg by mouth every evening.    Cardiac Studies:   Coronary angiogram 08/06/2018: normal coronary arteries. Moderate pulmonary hypertension due to AS with elevated PW. Preserved CO and CI.  TAVR 09/03/2018: Edwards Sapien 3 THV (size 23 mm, model # 9600TFX, serial # [id number]) by Lilly Cove and Darlina Guys.  CXR PA/LAT 11/07/2018:  Cardiac shadow is enlarged but stable. Changes of prior TAVR ar again noted. Aortic calcifications are seen. Previously noted left upper lobe pneumonia has resolved in the interval. No focal infiltrate or sizable effusion is seen. Changes of prior vertebral augmentation are noted.  Echocardiogram 11/15/2018:  Left ventricle cavity is normal in  size. Moderate concentric hypertrophy of the left ventricle. Normal global wall motion. Doppler evidence of grade II (pseudonormal) diastolic dysfunction, elevated LAP. Calculated EF 73%. Left atrial cavity is severely dilated at 5.1 cm. Bioprosthetic aortic valve with trace regurgitation. Normal aortic valve leaflet mobility. No evidence of aortic valve stenosis. Aortic valve peak  pressure gradient of 23 and mean gradient of 12 mm Hg, calculated aortic valve area cm. The valve opens well and the gradients probably normal for the size of the valve. Mild calcification of the mitral valve annulus. Mild mitral valve leaflet thickening. Mild (Grade I) mitral regurgitation. Compared to 07/02/2018, LA was mildly dilated and TAVR new.  Carotid artery duplex  06/28/2019: Stenosis in the right internal carotid artery (50-69%). Stenosis in the left internal carotid artery (50-69%). Antegrade right vertebral artery flow. Antegrade left vertebral artery flow. Compared to the study done on 07/02/2018, probably left ICA stenosis of 16-49% was present. Follow up in six months is appropriate if clinically indicated.  Assessment:     ICD-10-CM   1. Essential hypertension  I10 EKG 12-Lead    CBC    CMP14+EGFR    TSH  2. TAVR 09/03/2018: Edwards Sapien 3 THV (size 23 mm, model # 9600TFX, serial # [id number])  Z95.2   3. Asymptomatic bilateral carotid artery stenosis  I65.23   4. Hyperglycemia  R73.9 Hgb A1c w/o eAG  5. Hypercholesteremia  E78.00 Lipid Panel With LDL/HDL Ratio    LDL cholesterol, direct   EKG 09/70/2020: Sinus bradycardia at rate of 58 bpm otherwise normal EKG.  Recommendations:   Mrs. Rhen Dossantos is a fairly active Caucasian female with history of possibly mild coronary spasm by coronary angiogram in 2014,  severe AS s/p TAVR by Dr. Roxy Manns on 09/03/2018, asymptomatic bilateral carotid artery stenosis, essential hypertension, hyperlipidemia, hyperglycemia and GERD.  She is here on  a three-month office visit and follow-up of hypertension, aortic valve disease and also carotid stenosis.  Her EKG is normal, physical examination is unchanged.  There is no clinical evidence of congestive heart failure.  With regard to carotid artery stenosis, I think overall it is pretty fairly stable since 2019 February.  She has had last increase in the left carotid as well in the past.  I think it was just a technical issue that we did not detect it on her last office carotid duplex.  I simply reassured her.  She is on appropriate medical therapy.  As it is been greater than 8 months or so since her last lipids, I have taken the liberty of ordering all the lab profiles and I'll forward a copy of this to Dr. Jani Gravel.  Otherwise she remains stable, I'll see her back in 6 months for follow-up of aortic valve disease and hypertension and carotid disease.  Adrian Prows, MD, [facility] 07/10/2019, 9:27 AM [facility] [place] Pager: [phone] Office: [phone] If no answer Cell [phone]

## 2019-07-16 DIAGNOSIS — E7800 Pure hypercholesterolemia, unspecified: Secondary | ICD-10-CM | POA: Diagnosis not present

## 2019-07-16 DIAGNOSIS — R739 Hyperglycemia, unspecified: Secondary | ICD-10-CM | POA: Diagnosis not present

## 2019-07-16 DIAGNOSIS — I10 Essential (primary) hypertension: Secondary | ICD-10-CM | POA: Diagnosis not present

## 2019-07-17 LAB — LIPID PANEL WITH LDL/HDL RATIO
Cholesterol, Total: 103 mg/dL (ref 100–199)
HDL: 48 mg/dL (ref >39)
LDL Chol Calc (NIH): 39 mg/dL (ref 0–99)
LDL/HDL Ratio: 0.8 ratio (ref 0.0–3.2)
Triglycerides: 75 mg/dL (ref 0–149)
VLDL Cholesterol Cal: 16 mg/dL (ref 5–40)

## 2019-07-17 LAB — CBC
Hematocrit: 32 % — ABNORMAL LOW (ref 34.0–46.6)
Hemoglobin: 10.4 g/dL — ABNORMAL LOW (ref 11.1–15.9)
MCH: 26.8 pg (ref 26.6–33.0)
MCHC: 32.5 g/dL (ref 31.5–35.7)
MCV: 83 fL (ref 79–97)
Platelets: 194 10*3/uL (ref 150–450)
RBC: 3.88 x10E6/uL (ref 3.77–5.28)
RDW: 16.9 % — ABNORMAL HIGH (ref 11.7–15.4)
WBC: 5.7 10*3/uL (ref 3.4–10.8)

## 2019-07-17 LAB — LDL CHOLESTEROL, DIRECT: LDL Direct: 48 mg/dL (ref 0–99)

## 2019-07-17 LAB — CMP14+EGFR
ALT: 9 IU/L (ref 0–32)
AST: 18 IU/L (ref 0–40)
Albumin/Globulin Ratio: 0.5 — ABNORMAL LOW (ref 1.2–2.2)
Albumin: 2.9 g/dL — ABNORMAL LOW (ref 3.6–4.6)
Alkaline Phosphatase: 59 IU/L (ref 39–117)
BUN/Creatinine Ratio: 28 (ref 12–28)
BUN: 23 mg/dL (ref 8–27)
Bilirubin Total: 0.3 mg/dL (ref 0.0–1.2)
CO2: 23 mmol/L (ref 20–29)
Calcium: 8.7 mg/dL (ref 8.7–10.3)
Chloride: 96 mmol/L (ref 96–106)
Creatinine, Ser: 0.81 mg/dL (ref 0.57–1.00)
GFR calc Af Amer: 78 mL/min/{1.73_m2} (ref >59)
GFR calc non Af Amer: 67 mL/min/{1.73_m2} (ref >59)
Globulin, Total: 6.3 g/dL — ABNORMAL HIGH (ref 1.5–4.5)
Glucose: 101 mg/dL — ABNORMAL HIGH (ref 65–99)
Potassium: 4.3 mmol/L (ref 3.5–5.2)
Sodium: 132 mmol/L — ABNORMAL LOW (ref 134–144)
Total Protein: 9.2 g/dL — ABNORMAL HIGH (ref 6.0–8.5)

## 2019-07-17 LAB — HGB A1C W/O EAG: Hgb A1c MFr Bld: 5.8 % — ABNORMAL HIGH (ref 4.8–5.6)

## 2019-07-17 LAB — TSH: TSH: 0.719 u[IU]/mL (ref 0.450–4.500)

## 2019-07-17 NOTE — Progress Notes (Signed)
Called pt no answer, left a vm to return call back.

## 2019-07-22 DIAGNOSIS — S32010A Wedge compression fracture of first lumbar vertebra, initial encounter for closed fracture: Secondary | ICD-10-CM | POA: Diagnosis not present

## 2019-07-22 DIAGNOSIS — M545 Low back pain: Secondary | ICD-10-CM | POA: Diagnosis not present

## 2019-07-22 DIAGNOSIS — I10 Essential (primary) hypertension: Secondary | ICD-10-CM | POA: Diagnosis not present

## 2019-07-22 NOTE — Progress Notes (Signed)
Called pt no answer °

## 2019-07-29 NOTE — Progress Notes (Signed)
Called pt no answer, left a vm

## 2019-07-31 DIAGNOSIS — H5213 Myopia, bilateral: Secondary | ICD-10-CM | POA: Diagnosis not present

## 2019-07-31 DIAGNOSIS — H2513 Age-related nuclear cataract, bilateral: Secondary | ICD-10-CM | POA: Diagnosis not present

## 2019-08-05 NOTE — Progress Notes (Signed)
Called pt no answer, could not leave vm due to vm being full

## 2019-08-08 DIAGNOSIS — Z23 Encounter for immunization: Secondary | ICD-10-CM | POA: Diagnosis not present

## 2019-08-20 ENCOUNTER — Ambulatory Visit (INDEPENDENT_AMBULATORY_CARE_PROVIDER_SITE_OTHER): Payer: Medicare Other | Admitting: Physician Assistant

## 2019-08-20 ENCOUNTER — Encounter: Payer: Self-pay | Admitting: Physician Assistant

## 2019-08-20 ENCOUNTER — Other Ambulatory Visit: Payer: Self-pay

## 2019-08-20 ENCOUNTER — Ambulatory Visit (HOSPITAL_COMMUNITY): Payer: Medicare Other | Attending: Cardiovascular Disease

## 2019-08-20 VITALS — BP 142/76 | HR 71 | Ht 63.0 in | Wt 178.2 lb

## 2019-08-20 DIAGNOSIS — Z952 Presence of prosthetic heart valve: Secondary | ICD-10-CM

## 2019-08-20 DIAGNOSIS — I6523 Occlusion and stenosis of bilateral carotid arteries: Secondary | ICD-10-CM | POA: Diagnosis not present

## 2019-08-20 DIAGNOSIS — E785 Hyperlipidemia, unspecified: Secondary | ICD-10-CM | POA: Diagnosis not present

## 2019-08-20 DIAGNOSIS — I10 Essential (primary) hypertension: Secondary | ICD-10-CM

## 2019-08-20 NOTE — Progress Notes (Signed)
[facility] [place]                                       Cardiology Office Note    Date:  08/21/2019   ID:  [name], DOB November 09, 1933, MRN [id number]  PCP:  Jani Gravel, MD  Cardiologist:  Dr. Einar Gip / Dr. Angelena Form & Dr. Roxy Manns (TAVR)  CC: 1 year s/p TAVR   History of Present Illness:  Zoe [name] is a 85 y.o. female with a history of HTN, HLD, hypothyroidism, breast cancer s/p lumpectomy, carotid artery stenosis, and severe AS s/p TAVR (09/03/18) who presents to clinic for follow up.   Patient states that she was first noted to have a heart murmur on physical exam by her primary care physician several years ago. Echocardiogram revealed the presence of severe AS and she was referred to Dr. Johney Maine has been following her carefully ever since. Last year, follow-up echocardiogram demonstrated the presence of severe aortic stenosis with normal left ventricular systolic function and the patient developed exertional shortness of breath and fatigue. She subsequently underwent diagnostic cardiac catheterization which revealed normal coronary arteries.  The patient was evaluated by the multidisciplinary valve team and underwent successful TAVR with a 23 mm Edwards Sapien 3 THV via the TF approach on 09/03/18. Post operative echo showed normally functioning TAVR with mean gradient of 15 mm Hg and no PVL. She was discharged on Aspirin and plavix. 1 month echo showed  EF 65% with normally functioning TAVR with mean gradient of 15 mm Hg and no PVL.  She was recently seen by Dr. Einar Gip on 07/10/19. She was noted to have chronic dyspnea and was encouraged to exercise more which has led to an improvement in her breathing.   Today she presents to clinic for follow up. No CP. Her shortness of breath has really improved. She had been walking a lot but stopped recently. She has a fractured vertebrate which has slowed her down. She keeps up with her  house and works in the garden. No LE edema, orthopnea or PND. No dizziness or syncope. No blood in stool or urine. No palpitations.    Past Medical History:  Diagnosis Date  . Arthritis    some - per patient  . Breast cancer ([place])    breast cancer / left   . Cataract    bilat   . GERD (gastroesophageal reflux disease)   . History of kidney stones   . Hyperlipidemia   . Hypertension   . Hypothyroidism   . Macular degeneration    Left  . S/P TAVR (transcatheter aortic valve replacement) 09/03/2018   23 mm Edwards Sapien 3 transcatheter heart valve placed via percutaneous right transfemoral approach   . Severe aortic stenosis   . Stress incontinence   . Thyroid disease   . Tinnitus     Past Surgical History:  Procedure Laterality Date  . ABDOMINAL HYSTERECTOMY  1970's  . BACK SURGERY    . BREAST LUMPECTOMY  12/1998   lumpectomy  . CARDIAC CATHETERIZATION    . EYE SURGERY     cataract surgery bilat   . INTRAOPERATIVE TRANSTHORACIC ECHOCARDIOGRAM N/A 09/03/2018   Procedure: INTRAOPERATIVE TRANSTHORACIC ECHOCARDIOGRAM;  Surgeon: Burnell Blanks, MD;  Location: [facility];  Service: Open Heart Surgery;  Laterality: N/A;  . LITHOTRIPSY    . Right total  knee     2018 Dr. Alvan Dame  . RIGHT/LEFT HEART CATH AND CORONARY ANGIOGRAPHY N/A 08/06/2018   Procedure: RIGHT/LEFT HEART CATH AND CORONARY ANGIOGRAPHY;  Surgeon: Adrian Prows, MD;  Location: [facility];  Service: Cardiovascular;  Laterality: N/A;  . THYROIDECTOMY, PARTIAL  1975  . TONSILLECTOMY     as a child - patient not sure of exact date  . TOTAL KNEE ARTHROPLASTY Left 03/13/2016   Procedure: TOTAL KNEE ARTHROPLASTY;  Surgeon: Paralee Cancel, MD;  Location: [facility];  Service: Orthopedics;  Laterality: Left;  . TOTAL KNEE ARTHROPLASTY Right 06/18/2017   Procedure: RIGHT TOTAL KNEE ARTHROPLASTY;  Surgeon: Paralee Cancel, MD;  Location: [facility];  Service: Orthopedics;  Laterality: Right;  . TRANSCATHETER AORTIC VALVE  REPLACEMENT, TRANSFEMORAL N/A 09/03/2018   Procedure: TRANSCATHETER AORTIC VALVE REPLACEMENT, TRANSFEMORAL;  Surgeon: Burnell Blanks, MD;  Location: [facility];  Service: Open Heart Surgery;  Laterality: N/A;    Current Medications: Outpatient Medications Prior to Visit  Medication Sig Dispense Refill  . Artificial Tear Solution (SOOTHE XP) SOLN Place 1 drop into both eyes every evening.    Marland Kitchen aspirin 81 MG chewable tablet Chew 1 tablet (81 mg total) by mouth daily.    . carvedilol (COREG) 12.5 MG tablet Take 12.5 mg by mouth 2 (two) times daily with a meal.    . Cholecalciferol (VITAMIN D3) 2000 units TABS Take 2,000 Units by mouth daily.    . hydrochlorothiazide (MICROZIDE) 12.5 MG capsule Take 6.25 mg by mouth daily.    Marland Kitchen levothyroxine (SYNTHROID, LEVOTHROID) 100 MCG tablet Take 100 mcg by mouth daily before breakfast.  2  . Multiple Vitamins-Minerals (PRESERVISION AREDS 2) CAPS Take 1 capsule by mouth 2 (two) times daily.    . pantoprazole (PROTONIX) 40 MG tablet TAKE 1 TABLET BY MOUTH EVERY DAY 90 tablet 3  . pravastatin (PRAVACHOL) 40 MG tablet Take 40 mg by mouth every evening.    . valsartan (DIOVAN) 320 MG tablet TAKE 1 TABLET BY MOUTH EVERY DAY 90 tablet 0  . mirabegron ER (MYRBETRIQ) 25 MG TB24 tablet Take 25 mg by mouth daily.     No facility-administered medications prior to visit.      Allergies:   Penicillins and Sulfa antibiotics   Social History   Socioeconomic History  . Marital status: Married    Spouse name: Not on file  . Number of children: 4  . Years of education: Not on file  . Highest education level: Master's degree (e.g., MA, MS, MEng, MEd, MSW, MBA)  Occupational History  . 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 Financial resource strain: Not very hard  . Food insecurity    Worry: Never true    Inability: Never true  . Transportation needs    Medical: No    Non-medical: No  Tobacco Use  . Smoking  status: Never Smoker  . Smokeless tobacco: Never Used  Substance and Sexual Activity  . Alcohol use: No  . Drug use: No  . Sexual activity: Not Currently  Lifestyle  . Physical activity    Days per week: 0 days    Minutes per session: 0 min  . Stress: To some extent  Relationships  . Social Herbalist on phone: Not on file    Gets together: Not on file    Attends religious service: Not on file    Active member of club or organization: Not on file  Attends meetings of clubs or organizations: Not on file    Relationship status: Not on file  Other Topics Concern  . Not on file  Social History Narrative  . Not on file     Family History:  The patient's family history includes Diabetes in her mother; Heart disease in her father; Stroke in her mother.      ROS:   Please see the history of present illness.    ROS All other systems reviewed and are negative.   PHYSICAL EXAM:   VS:  BP (!) 142/76   Pulse 71   Ht 5\' 3"  (1.6 m)   Wt 178 lb 3.2 oz (80.8 kg)   SpO2 97%   BMI 31.57 kg/m    GEN: Well nourished, well developed, in no acute distress HEENT: normal Neck: no JVD or masses. Bilateral carotid bruits  Cardiac: RRR; 2/6 SEM. No rubs, or gallops,no edema  Respiratory:  clear to auscultation bilaterally, normal work of breathing GI: soft, nontender, nondistended, + BS MS: no deformity or atrophy Skin: warm and dry, no rash Neuro:  Alert and Oriented x 3, Strength and sensation are intact Psych: euthymic mood, full affect   Wt Readings from Last 3 Encounters:  08/20/19 178 lb 3.2 oz (80.8 kg)  07/10/19 174 lb (78.9 kg)  04/15/19 179 lb (81.2 kg)      Studies/Labs Reviewed:   EKG:  EKG is NOT ordered today.   Recent Labs: 08/30/2018: B Natriuretic Peptide 165.7 09/04/2018: Magnesium 1.5 07/16/2019: ALT 9; BUN 23; Creatinine, Ser 0.81; Hemoglobin 10.4; Platelets 194; Potassium 4.3; Sodium 132; TSH 0.719   Lipid Panel    Component Value Date/Time    CHOL 103 07/16/2019 0941   TRIG 75 07/16/2019 0941   HDL 48 07/16/2019 0941   LDLCALC 39 07/16/2019 0941   LDLDIRECT 48 07/16/2019 0941    Additional studies/ records that were reviewed today include:  TAVR OPERATIVE NOTE   Date of Procedure:09/03/2018  Preoperative Diagnosis:Severe Aortic Stenosis   Postoperative Diagnosis:Same   Procedure:   Transcatheter Aortic Valve Replacement - PercutaneousRightTransfemoral Approach Edwards Sapien 3 THV (size 42mm, model # 9600TFX, serial # [id number])  Co-Surgeons:Clarence H. Roxy Manns, MD and Lauree Chandler, MD  Pre-operative Echo Findings: ? Severe aortic stenosis ? Normalleft ventricular systolic function  Post-operative Echo Findings: ? Noparavalvular leak ? Normalleft ventricular systolic function   _____________  Echo 09/04/18 Study Conclusions - Left ventricle: The cavity size was normal. Wall thickness was   increased in a pattern of moderate LVH. Systolic function was   vigorous. The estimated ejection fraction was in the range of 65%   to 70%. Wall motion was normal; there were no regional wall   motion abnormalities. - Aortic valve: s/p TAVR 09/03/2018 Percell Miller Sapien 3, 23 mm). The   prosthesis had a normal range of motion. The sewing ring appeared   normal, had no rocking motion, and showed no evidence of   dehiscence. Mean gradient (S): 15 mm Hg. No prosthetic or   periprosthetic regurgitation. Valve area (VTI): 1.45 cm^2. Valve   area (Vmax): 1.43 cm^2. - Mitral valve: Calcified annulus. There was trivial regurgitation.   Mean gradient (D): 4 mm Hg (HR 78 BPM). - Left atrium: The atrium was mildly dilated. - Right ventricle: The cavity size was normal. Wall thickness was   normal. Systolic function was normal. - Right atrium: The atrium was normal in size. Central venous   pressure (est): 8 mm Hg. - Tricuspid valve: There was  trivial regurgitation. - Inferior vena cava: The vessel was normal in size. The   respirophasic diameter changes were blunted (< 50%). - Pericardium, extracardiac: There was no pericardial effusion. - Compared to the immediate post-procedure TTE, the mean gradient   in systole of the aortic prosthesis has increased slightly. No   other significant changes on side by side comparison of images.  ______________  Echo 10/03/18 Study Conclusions - Left ventricle: The cavity size was normal. Wall thickness was   increased in a pattern of moderate LVH. Systolic function was   vigorous. The estimated ejection fraction was in the range of 65%   to 70%. Wall motion was normal; there were no regional wall   motion abnormalities. Features are consistent with a pseudonormal   left ventricular filling pattern, with concomitant abnormal   relaxation and increased filling pressure (grade 2 diastolic   dysfunction). - Aortic valve: A bioprosthesis was present. - Mitral valve: Mildly to moderately calcified annulus. Mildly   thickened leaflets .  Aortic valve:  The AV gradient is appropriate for this TAVR . A bioprosthesis was present.  Doppler:   There was no stenosis. VTI ratio of LVOT to aortic valve: 0.47. Valve area (VTI): 1.32 cm^2. Indexed valve area (VTI): 0.68 cm^2/m^2. Peak velocity ratio of LVOT to aortic valve: 0.37. Valve area (Vmax): 1.06 cm^2. Indexed valve area (Vmax): 0.54 cm^2/m^2. Mean velocity ratio of LVOT to aortic valve: 0.45. Valve area (Vmean): 1.28 cm^2. Indexed valve area (Vmean): 0.66 cm^2/m^2.    Mean gradient (S): 15 mm Hg. Peak gradient (S): 32 mm Hg.  ______________  Echo 08/20/19 IMPRESSIONS  1. 23 mm S3 in aortic position. Vmax 2.6 m/s, MG 16 mmHg, AVA 1.08 cm2. Normally functioning TAVR valve without evidence of paravalvular leak. No change from 10/03/2018.  2. Left ventricular ejection fraction, by visual estimation, is 70 to 75%. The left ventricle has  hyperdynamic function. There is mildly increased left ventricular hypertrophy.  3. The left ventricle has no regional wall motion abnormalities.  4. Global right ventricle has normal systolic function.The right ventricular size is normal. No increase in right ventricular wall thickness.  5. Left atrial size was normal.  6. Right atrial size was normal.  7. Presence of pericardial fat pad.  8. Mild thickening of the anterior and posterior mitral valve leaflet(s).  9. Moderate mitral annular calcification. 10. The mitral valve is degenerative. Trace mitral valve regurgitation. 11. The tricuspid valve is grossly normal. Tricuspid valve regurgitation is not demonstrated. 12. Aortic valve regurgitation is not visualized. 13. The pulmonic valve was grossly normal. Pulmonic valve regurgitation is mild. 14. Normal pulmonary artery systolic pressure. 15. The tricuspid regurgitant velocity is 2.36 m/s, and with an assumed right atrial pressure of 3 mmHg, the estimated right ventricular systolic pressure is normal at 25.3 mmHg. 16. The inferior vena cava is normal in size with greater than 50% respiratory variability, suggesting right atrial pressure of 3 mmHg.  In comparison to the previous echocardiogram(s): Prior examinations were reviewed in a side by side comparison of images. No change from 10/03/2018.  ASSESSMENT & PLAN:   Severe AS s/p TAVR: echo today shows EF 70-75%, normally functioning TAVR with a mean gradient of 16 mmHg and no PVL. She has NYHA class II symptoms. Her shortness of breath has improved a lot. She SBE prophylaxis discussed; she has clindamycin. Continue on aspirin 81 mg daily alone.  HTN: borderline elevated today. No changes made today. She will watch BP at home and  call us or Dr. Einar Gip if it is consistently elevated at home.   HLD: continue statin.   Carotid artery stenosis: followed closely by Dr. Einar Gip. Continue medical therapy.    Medication Adjustments/Labs and  Tests Ordered: Current medicines are reviewed at length with the patient today.  Concerns regarding medicines are outlined above.  Medication changes, Labs and Tests ordered today are listed in the Patient Instructions below. Patient Instructions  Medication Instructions:  Your physician recommends that you continue on your current medications as directed. Please refer to the Current Medication list given to you today.  Labwork: NONE  Testing/Procedures: NONE  Follow-Up: Your physician wants you to follow-up as needed.    If you need a refill on your cardiac medications before your next appointment, please call your pharmacy.       Signed, Angelena Form, PA-C  08/21/2019 11:58 AM    [facility] [address], [place], [place]  [address] Phone: [phone]; Fax: [phone]

## 2019-08-20 NOTE — Patient Instructions (Signed)
Medication Instructions:  Your physician recommends that you continue on your current medications as directed. Please refer to the Current Medication list given to you today.  Labwork: NONE  Testing/Procedures: NONE  Follow-Up: Your physician wants you to follow-up as needed.  If you need a refill on your cardiac medications before your next appointment, please call your pharmacy.   

## 2019-09-02 DIAGNOSIS — S32010A Wedge compression fracture of first lumbar vertebra, initial encounter for closed fracture: Secondary | ICD-10-CM | POA: Diagnosis not present

## 2019-09-02 DIAGNOSIS — M545 Low back pain: Secondary | ICD-10-CM | POA: Diagnosis not present

## 2019-09-04 ENCOUNTER — Other Ambulatory Visit: Payer: Self-pay

## 2019-09-04 DIAGNOSIS — I10 Essential (primary) hypertension: Secondary | ICD-10-CM

## 2019-09-04 MED ORDER — VALSARTAN 320 MG PO TABS: 320.0000 mg | ORAL_TABLET | Freq: Every day | ORAL | 3 refills | Status: DC

## 2019-09-10 ENCOUNTER — Other Ambulatory Visit: Payer: Self-pay

## 2019-09-10 DIAGNOSIS — I10 Essential (primary) hypertension: Secondary | ICD-10-CM

## 2019-09-10 MED ORDER — VALSARTAN 320 MG PO TABS: 320.0000 mg | ORAL_TABLET | Freq: Every day | ORAL | 3 refills | Status: DC

## 2019-09-24 NOTE — Progress Notes (Signed)
Called patient on both numbers, NA, no VM to leave message.

## 2019-11-05 ENCOUNTER — Ambulatory Visit: Payer: Medicare Other | Attending: Internal Medicine

## 2019-11-05 DIAGNOSIS — Z23 Encounter for immunization: Secondary | ICD-10-CM

## 2019-11-05 NOTE — Progress Notes (Signed)
   Covid-19 Vaccination Clinic  Name:  Zoe [name]    MRN: [id number] DOB: 04-11-1934  11/05/2019  [name] was observed post Covid-19 immunization for 15 minutes without incidence. She was provided with Vaccine Information Sheet and instruction to access the V-Safe system.   [name] was instructed to call 911 with any severe reactions post vaccine: Marland Kitchen Difficulty breathing  . Swelling of your face and throat  . A fast heartbeat  . A bad rash all over your body  . Dizziness and weakness    Immunizations Administered    Name Date Dose VIS Date Route   Pfizer COVID-19 Vaccine 11/05/2019 10:33 AM 0.3 mL 10/03/2019 Intramuscular   Manufacturer: [employer]   Lot: [id number]   [place]: [id number]

## 2019-11-17 ENCOUNTER — Ambulatory Visit: Payer: Medicare Other

## 2019-11-25 ENCOUNTER — Ambulatory Visit: Payer: Medicare Other | Attending: Internal Medicine

## 2019-11-25 DIAGNOSIS — Z23 Encounter for immunization: Secondary | ICD-10-CM | POA: Insufficient documentation

## 2019-11-25 NOTE — Progress Notes (Signed)
   Covid-19 Vaccination Clinic  Name:  Zoe [name]    MRN: [id number] DOB: Dec 11, 1933  11/25/2019  [name] was observed post Covid-19 immunization for 30 minutes based on pre-vaccination screening without incidence. She was provided with Vaccine Information Sheet and instruction to access the V-Safe system.   [name] was instructed to call 911 with any severe reactions post vaccine: Marland Kitchen Difficulty breathing  . Swelling of your face and throat  . A fast heartbeat  . A bad rash all over your body  . Dizziness and weakness    Immunizations Administered    Name Date Dose VIS Date Route   Pfizer COVID-19 Vaccine 11/25/2019  8:30 AM 0.3 mL 10/03/2019 Intramuscular   Manufacturer: [place]   Lot: [id number]   [place]: [id number]

## 2019-12-09 ENCOUNTER — Telehealth: Payer: Self-pay

## 2019-12-09 NOTE — Telephone Encounter (Signed)
VM [id number] :Patient called and left a message with concerns about having reactions to the Valsartan that you prescribed. She is having hair loss and severe scalp itching and she has tried milder shampoos and conditioners. She has noticed that the scalp itching has worsened here lately and has concerns that it may be the Valsartan.

## 2019-12-10 NOTE — Telephone Encounter (Signed)
Not sure if related to valsartan particularly. I have not seen her in sometime, not sure why the valsartan is listed under me. She can hold it and see if it is the cause.

## 2019-12-10 NOTE — Telephone Encounter (Signed)
I called patient back, she will try holding the Valsartan to sees if that helps and if it doesn't, she will go see her PCP about her scalp.

## 2019-12-26 DIAGNOSIS — I10 Essential (primary) hypertension: Secondary | ICD-10-CM | POA: Diagnosis not present

## 2019-12-26 DIAGNOSIS — S32010A Wedge compression fracture of first lumbar vertebra, initial encounter for closed fracture: Secondary | ICD-10-CM | POA: Diagnosis not present

## 2019-12-29 ENCOUNTER — Ambulatory Visit: Payer: Medicare Other

## 2019-12-29 ENCOUNTER — Other Ambulatory Visit: Payer: Self-pay

## 2019-12-29 DIAGNOSIS — I6523 Occlusion and stenosis of bilateral carotid arteries: Secondary | ICD-10-CM

## 2020-01-04 ENCOUNTER — Other Ambulatory Visit: Payer: Self-pay | Admitting: Cardiology

## 2020-01-04 DIAGNOSIS — I6523 Occlusion and stenosis of bilateral carotid arteries: Secondary | ICD-10-CM

## 2020-01-07 ENCOUNTER — Encounter: Payer: Self-pay | Admitting: Cardiology

## 2020-01-07 ENCOUNTER — Other Ambulatory Visit: Payer: Self-pay

## 2020-01-07 ENCOUNTER — Ambulatory Visit: Payer: Medicare Other | Admitting: Cardiology

## 2020-01-07 VITALS — BP 130/80 | HR 68 | Temp 97.2°F | Resp 16 | Ht 63.0 in | Wt 180.0 lb

## 2020-01-07 DIAGNOSIS — I6523 Occlusion and stenosis of bilateral carotid arteries: Secondary | ICD-10-CM

## 2020-01-07 DIAGNOSIS — E7800 Pure hypercholesterolemia, unspecified: Secondary | ICD-10-CM | POA: Diagnosis not present

## 2020-01-07 DIAGNOSIS — I10 Essential (primary) hypertension: Secondary | ICD-10-CM | POA: Diagnosis not present

## 2020-01-07 DIAGNOSIS — Z952 Presence of prosthetic heart valve: Secondary | ICD-10-CM

## 2020-01-07 NOTE — Progress Notes (Signed)
Primary Physician:  Jani Gravel, MD   Patient ID: Zoe [name], female    DOB: Apr 16, 1934, 86 y.o.   MRN: [id number]  Subjective:    Chief Complaint  Patient presents with  . Hypertension  . Aortic Stenosis  . Follow-up    6 month  . Results    HPI: Zoe [name]  is a 86 y.o. female  with history of possibly mild coronary spasm by coronary angiogram in 2014 when she presented with angina pectoris, otherwise no significant CAD, severe AS s/p TAVR by Dr. Roxy Manns on 11/12., asymptomatic carotid artery stenosis, essential hypertension, hyperlipidemia, and GERD.   She is presently doing well except for chronic dyspnea, she is now exercising on a regular basis and dyspnea has improved.  No leg edema, no PND or orthopnea. Denies TIA, denies headache, visual disturbances.  Past Medical History:  Diagnosis Date  . Arthritis    some - per patient  . Breast cancer ([place])    breast cancer / left   . Cataract    bilat   . GERD (gastroesophageal reflux disease)   . History of kidney stones   . Hyperlipidemia   . Hypertension   . Hypothyroidism   . Macular degeneration    Left  . S/P TAVR (transcatheter aortic valve replacement) 09/03/2018   23 mm Edwards Sapien 3 transcatheter heart valve placed via percutaneous right transfemoral approach   . Severe aortic stenosis   . Stress incontinence   . Thyroid disease   . Tinnitus     Past Surgical History:  Procedure Laterality Date  . ABDOMINAL HYSTERECTOMY  1970's  . BACK SURGERY    . BREAST LUMPECTOMY  12/1998   lumpectomy  . CARDIAC CATHETERIZATION    . EYE SURGERY     cataract surgery bilat   . INTRAOPERATIVE TRANSTHORACIC ECHOCARDIOGRAM N/A 09/03/2018   Procedure: INTRAOPERATIVE TRANSTHORACIC ECHOCARDIOGRAM;  Surgeon: Burnell Blanks, MD;  Location: [facility];  Service: Open Heart Surgery;  Laterality: N/A;  . LITHOTRIPSY    . Right total knee     2018 Dr. Alvan Dame  . RIGHT/LEFT HEART CATH AND CORONARY ANGIOGRAPHY  N/A 08/06/2018   Procedure: RIGHT/LEFT HEART CATH AND CORONARY ANGIOGRAPHY;  Surgeon: Adrian Prows, MD;  Location: [facility];  Service: Cardiovascular;  Laterality: N/A;  . THYROIDECTOMY, PARTIAL  1975  . TONSILLECTOMY     as a child - patient not sure of exact date  . TOTAL KNEE ARTHROPLASTY Left 03/13/2016   Procedure: TOTAL KNEE ARTHROPLASTY;  Surgeon: Paralee Cancel, MD;  Location: [facility];  Service: Orthopedics;  Laterality: Left;  . TOTAL KNEE ARTHROPLASTY Right 06/18/2017   Procedure: RIGHT TOTAL KNEE ARTHROPLASTY;  Surgeon: Paralee Cancel, MD;  Location: [facility];  Service: Orthopedics;  Laterality: Right;  . TRANSCATHETER AORTIC VALVE REPLACEMENT, TRANSFEMORAL N/A 09/03/2018   Procedure: TRANSCATHETER AORTIC VALVE REPLACEMENT, TRANSFEMORAL;  Surgeon: Burnell Blanks, MD;  Location: [facility];  Service: Open Heart Surgery;  Laterality: N/A;   Social History   Tobacco Use  . Smoking status: Never Smoker  . Smokeless tobacco: Never Used  Substance Use Topics  . Alcohol use: No     Review of Systems  Cardiovascular: Positive for dyspnea on exertion (stable). Negative for chest pain and leg swelling.  Musculoskeletal: Positive for arthritis and back pain.  Gastrointestinal: Negative for melena.   Objective:  Blood pressure 130/80, pulse 68, temperature (!) 97.2 F (36.2 C), temperature source Temporal, resp. rate 16, height 5'  3" (1.6 m), weight 180 lb (81.6 kg), SpO2 98 %. Body mass index is 31.89 kg/m.  Vitals with BMI 01/07/2020 08/20/2019 07/10/2019  Height 5\' 3"  5\' 3"  5\' 4"   Weight 180 lbs 178 lbs 3 oz 174 lbs  BMI 31.89 [id number]  Systolic [id number]  Diastolic 80 76 76  Pulse 68 71 73      Physical Exam  Constitutional: She is oriented to person, place, and time. Vital signs are normal. She appears well-developed and well-nourished.  Cardiovascular: Normal rate, regular rhythm and intact distal pulses.  Murmur heard.  Early systolic murmur is present with a  grade of 2/6 at the upper right sternal border. Pulmonary/Chest: Effort normal and breath sounds normal. No accessory muscle usage. No respiratory distress.  Abdominal: Soft. Bowel sounds are normal.  Neurological: She is alert and oriented to person, place, and time.  Vitals reviewed.  Radiology: No results found.  Laboratory examination:   CMP Latest Ref Rng & Units 07/16/2019 09/04/2018 09/03/2018  Glucose 65 - 99 mg/dL 101(H) 108(H) 127(H)  BUN 8 - 27 mg/dL 23 18 17   Creatinine 0.57 - 1.00 mg/dL 0.81 0.75 0.60  Sodium 134 - 144 mmol/L 132(L) 132(L) 138  Potassium 3.5 - 5.2 mmol/L 4.3 4.1 4.0  Chloride 96 - 106 mmol/L 96 99 99  CO2 20 - 29 mmol/L 23 28 -  Calcium 8.7 - 10.3 mg/dL 8.7 8.5(L) -  Total Protein 6.0 - 8.5 g/dL 9.2(H) - -  Total Bilirubin 0.0 - 1.2 mg/dL 0.3 - -  Alkaline Phos 39 - 117 IU/L 59 - -  AST 0 - 40 IU/L 18 - -  ALT 0 - 32 IU/L 9 - -   CBC Latest Ref Rng & Units 07/16/2019 09/04/2018 09/03/2018  WBC 3.4 - 10.8 x10E3/uL 5.7 8.3 -  Hemoglobin 11.1 - 15.9 g/dL 10.4(L) 9.5(L) 10.9(L)  Hematocrit 34.0 - 46.6 % 32.0(L) 31.3(L) 32.0(L)  Platelets 150 - 450 x10E3/uL 194 149(L) -   Lipid Panel     Component Value Date/Time   CHOL 103 07/16/2019 0941   TRIG 75 07/16/2019 0941   HDL 48 07/16/2019 0941   LDLCALC 39 07/16/2019 0941   LDLDIRECT 48 07/16/2019 0941   HEMOGLOBIN A1C Lab Results  Component Value Date   HGBA1C 5.8 (H) 07/16/2019   MPG 111.15 08/30/2018   TSH Recent Labs    07/16/19 0941  TSH 0.719   External labs  Cholesterol, total 103.000 07/16/2019 HDL 48.000 07/16/2019 LDL 48.000 07/16/2019 Triglycerides 75.000 07/16/2019  A1C 5.800 07/16/2019, TSH 0.719 07/16/2019  Hemoglobin 10.400 G/ 07/16/2019; INR 1.180 08/30/2018 Platelets 194.000 07/16/2019:.  Creatinine, Serum 0.810 07/16/2019 Potassium 4.300 07/16/2019 ALT (SGPT) 9.000 07/16/2019  There are no discontinued medications. Current Meds  Medication Sig  . Artificial Tear Solution  (SOOTHE XP) SOLN Place 1 drop into both eyes every evening.  Marland Kitchen aspirin 81 MG chewable tablet Chew 1 tablet (81 mg total) by mouth daily.  . carvedilol (COREG) 12.5 MG tablet Take 12.5 mg by mouth 2 (two) times daily with a meal.  . Cholecalciferol (VITAMIN D3) 2000 units TABS Take 2,000 Units by mouth daily.  . hydrochlorothiazide (MICROZIDE) 12.5 MG capsule Take 6.25 mg by mouth daily.  Marland Kitchen levothyroxine (SYNTHROID, LEVOTHROID) 100 MCG tablet Take 100 mcg by mouth daily before breakfast.  . Multiple Vitamins-Minerals (PRESERVISION AREDS 2) CAPS Take 1 capsule by mouth 2 (two) times daily.  . pantoprazole (PROTONIX) 40 MG tablet TAKE 1 TABLET BY MOUTH EVERY  DAY  . pravastatin (PRAVACHOL) 40 MG tablet Take 40 mg by mouth every evening.  . valsartan (DIOVAN) 320 MG tablet Take 1 tablet (320 mg total) by mouth daily.    Cardiac Studies:   Coronary angiogram 08/06/2018: normal coronary arteries. Moderate pulmonary hypertension due to AS with elevated PW. Preserved CO and CI.  TAVR 09/03/2018: Edwards Sapien 3 THV (size 23 mm, model # 9600TFX, serial # [id number]) by Lilly Cove and Darlina Guys.  CXR PA/LAT 11/07/2018:  Cardiac shadow is enlarged but stable. Changes of prior TAVR ar again noted. Aortic calcifications are seen. Previously noted left upper lobe pneumonia has resolved in the interval. No focal infiltrate or sizable effusion is seen. Changes of prior vertebral augmentation are noted.  Echocardiogram 11/15/2018:  Left ventricle cavity is normal in size. Moderate concentric hypertrophy of the left ventricle. Normal global wall motion. Doppler evidence of grade II (pseudonormal) diastolic dysfunction, elevated LAP. Calculated EF 73%. Left atrial cavity is severely dilated at 5.1 cm. Bioprosthetic aortic valve with trace regurgitation. Normal aortic valve leaflet mobility. No evidence of aortic valve stenosis. Aortic valve peak pressure gradient of 23 and mean gradient of 12 mm Hg, calculated  aortic valve area cm. The valve opens well and the gradients probably normal for the size of the valve. Mild calcification of the mitral valve annulus. Mild mitral valve leaflet thickening. Mild (Grade I) mitral regurgitation. Compared to 07/02/2018, LA was mildly dilated and TAVR new.  Carotid artery duplex 12/29/2019:  Duplex suggests stenosis in the right internal carotid artery (16-49%).  Duplex suggests stenosis in the left internal carotid artery (50-69%). Stenosis in the left external carotid artery (<50%).  Antegrade right vertebral artery flow. Antegrade left vertebral artery flow.  Follow up in six months is appropriate if clinically indicated. No significant change since 07/01/2019. Right ICA stenosis improved.   EKG   EKG 01/07/2020: Normal sinus rhythm at rate of 77 bpm, left atrial enlargement, normal axis.  PVCs (2).  No evidence of ischemia, normal QT interval.   09/70/2020: Sinus bradycardia at rate of 58 bpm otherwise normal EKG.  Assessment:     ICD-10-CM   1. Asymptomatic bilateral carotid artery stenosis  I65.23 EKG 12-Lead  2. TAVR 09/03/2018: Edwards Sapien 3 THV (size 23 mm, model # 9600TFX, serial # [id number])  Z95.2   3. Essential hypertension  I10   4. Hypercholesteremia  E78.00    Recommendations:   Mrs. Nastassia Teigland is a fairly active 86 y.o. Caucasian female with history of possibly mild coronary spasm by coronary angiogram in 2014,  severe AS s/p TAVR by Dr. Roxy Manns on 09/03/2018, asymptomatic bilateral carotid artery stenosis, essential hypertension, hyperlipidemia, hyperglycemia and GERD.  She is here on a six--month office visit and follow-up. Her EKG is normal, physical examination is unchanged.  There is no clinical evidence of congestive heart failure. Prosthetic AV is functioning normally and dyspnea is stable. Suspect dyspnea is due to deconditioning and should improve over time with activity.   With regard to carotid artery stenosis, I think  overall it is pretty fairly stable since 2019 February.  I simply reassured her.  She is on appropriate medical therapy. Lipids are at goal, normal renal function and good BP control. Otherwise she remains stable, I'll see her back in 12 months for follow-up of aortic valve disease and hypertension and carotid disease.  Adrian Prows, MD, [facility] 01/07/2020, 10:14 AM [facility]. [place] Office: [phone]

## 2020-01-28 IMAGING — CT CT HEART MORP W/ CTA COR W/ SCORE W/ CA W/CM &/OR W/O CM
2 series · 16 of 20 positions shown, 18 images · non-contrast
Comparison: Chest CT 07/31/2006.

CLINICAL DATA: Aortic stenosis

EXAM:
Cardiac TAVR CT
TECHNIQUE: The patient was scanned on a Siemens Force [[email or web address]]ice scanner. A 120
kV retrospective scan was triggered in the descending thoracic aorta
at 111 HU's. Gantry rotation speed was 270 msecs and collimation was
.9 mm. No beta blockade or nitro were given. The 3D data set was
reconstructed in 5% intervals of the R-R cycle. Systolic and
diastolic phases were analyzed on a dedicated work station using
MPR, MIP and VRT modes. The patient received 80 cc of contrast.

[Series 3: best diast 82 % · axial · 0.39mm/px · z∈[+1230,+1426]mm · 8 of 630 slices shown, 10 images]
[im 70/630  vessel]
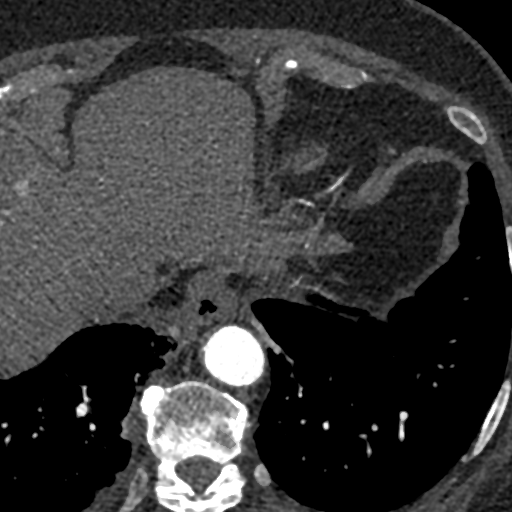
[im 70/630  lung]
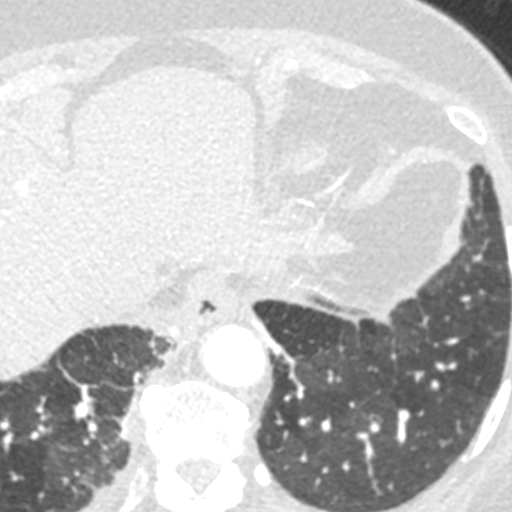
[im 140/630  vessel]
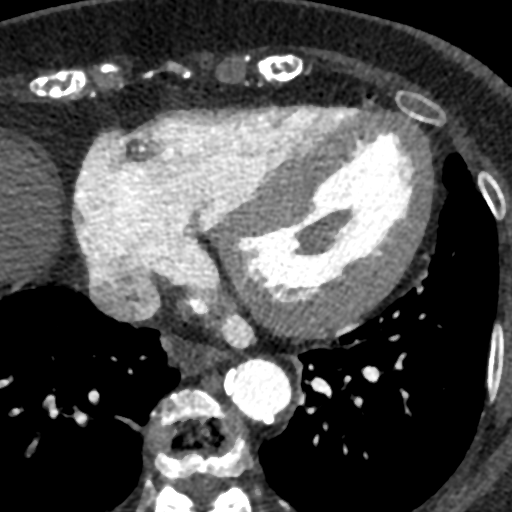
[im 210/630  vessel]
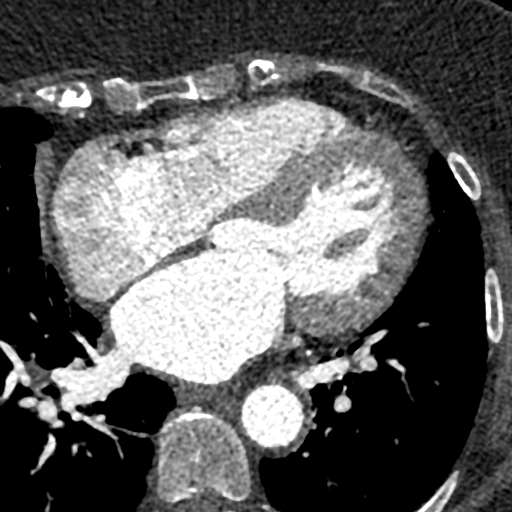
[im 280/630  vessel]
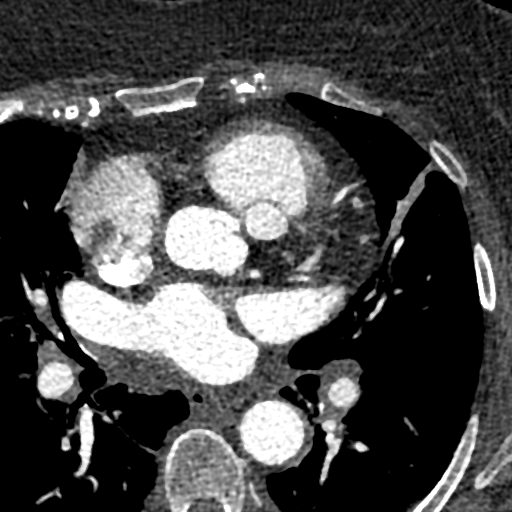
[im 350/630  vessel]
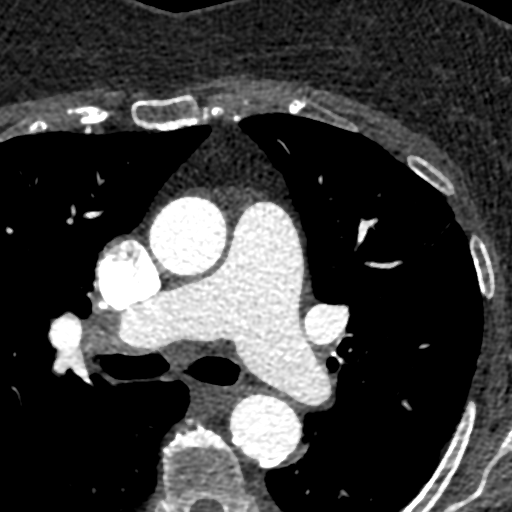
[im 350/630  lung]
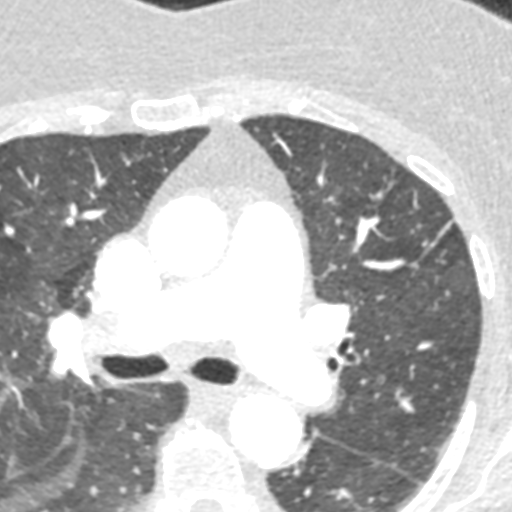
[im 420/630  vessel]
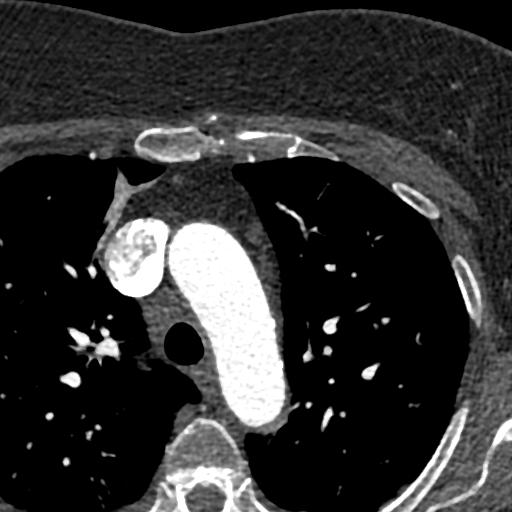
[im 490/630  vessel]
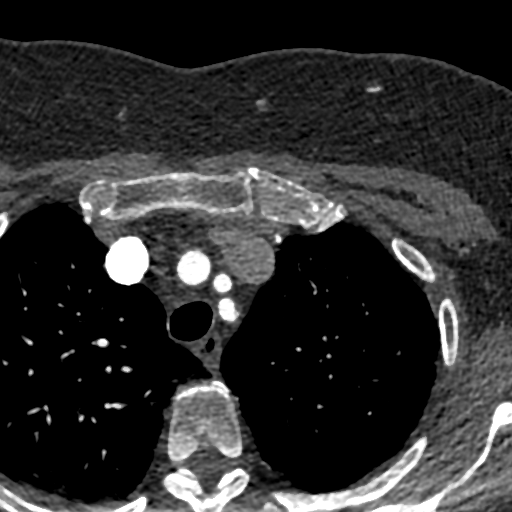
[im 560/630  vessel]
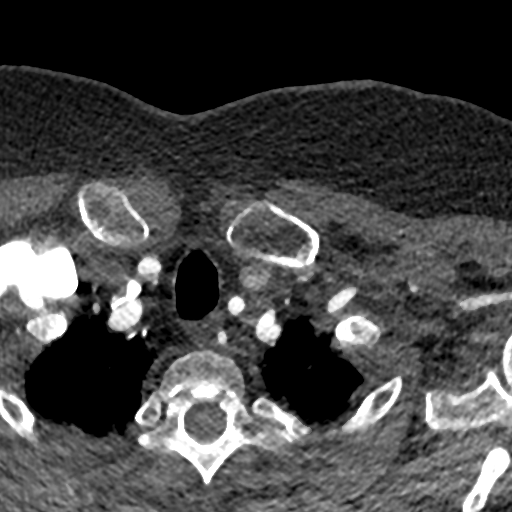

[Series 4: best syst 42 % · axial · 0.39mm/px · z∈[+1230,+1426]mm · 8 of 630 slices shown]
[im 70/630  vessel]
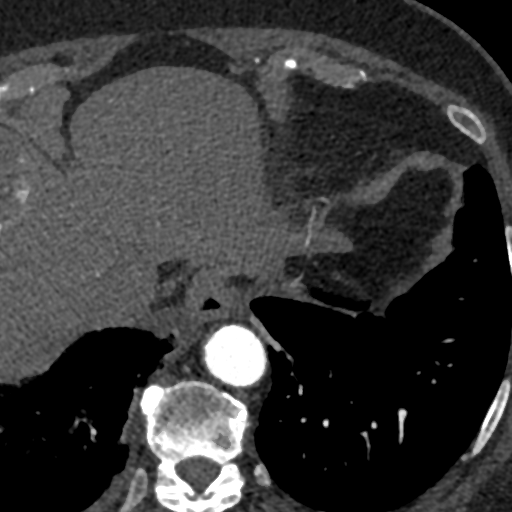
[im 140/630  vessel]
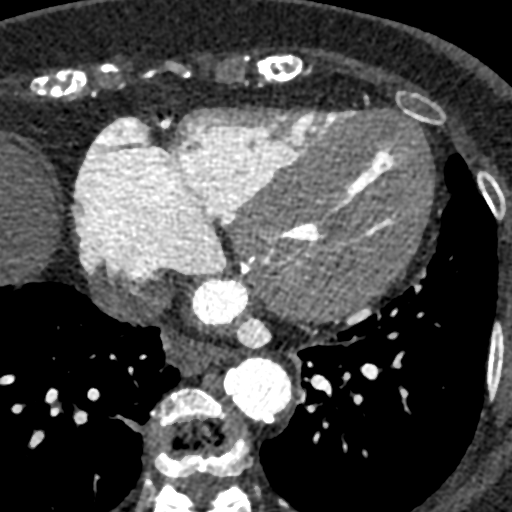
[im 210/630  vessel]
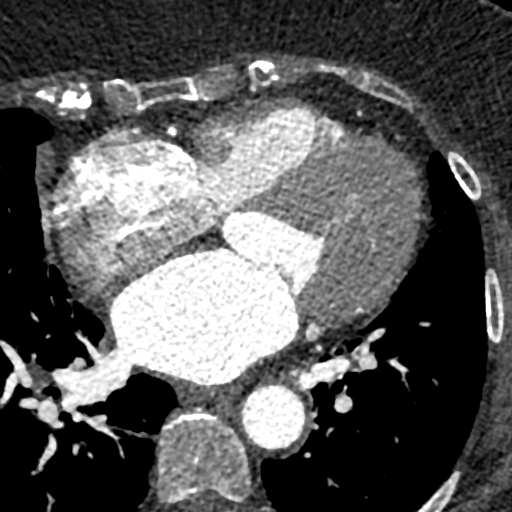
[im 280/630  vessel]
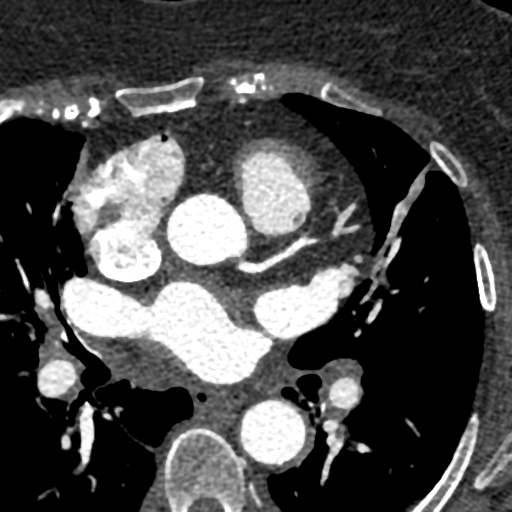
[im 350/630  vessel]
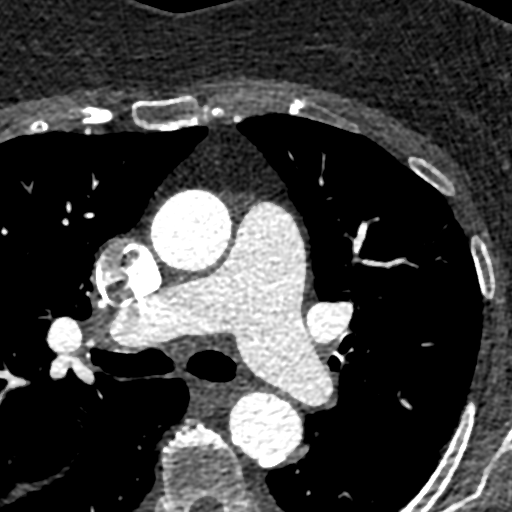
[im 420/630  vessel]
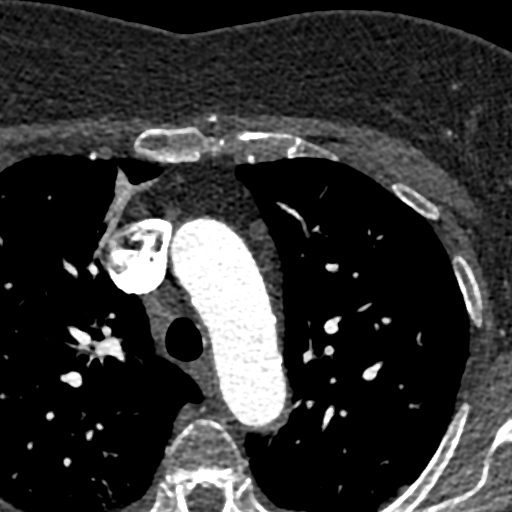
[im 490/630  vessel]
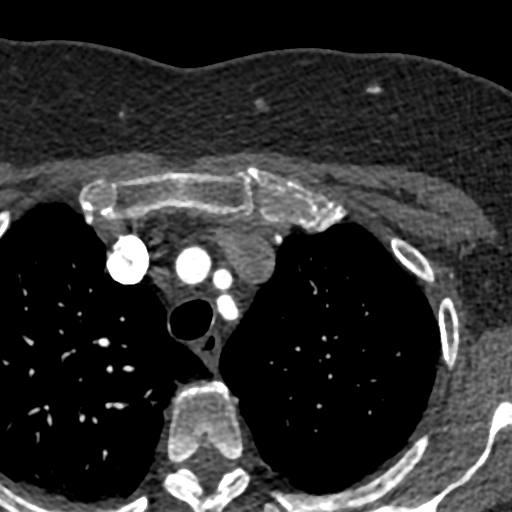
[im 560/630  vessel]
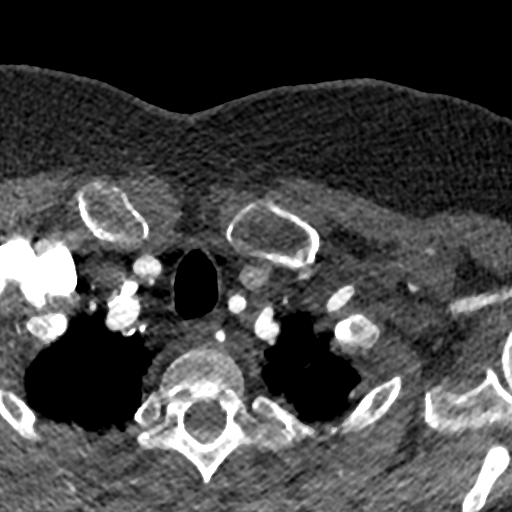

[16 of 20 positions shown; findings below may reference images not displayed]

FINDINGS: Aortic Valve: Tri leaflet calcified with restricted leaflet motion

Aorta: Mild calcific atherosclerosis with normal arch vessels

Sinotubular Junction: 23 mm

Ascending Thoracic Aorta: 28 mm

Aortic Arch: 24 mm

Descending Thoracic Aorta: 23 mm

Sinus of Valsalva Measurements:

Non-coronary: 27 mm

Right - coronary: 26.4 mm

Left - coronary: 27.2 mm

Coronary Artery Height above Annulus:

Left Main: 11 mm above annulus

Right Coronary: 14 mm above annulus

Virtual Basal Annulus Measurements:

Maximum/Minimum Diameter: 18 x 25 mm

Perimeter: 70.6 mm

Area: 373 mm2

Coronary Arteries: Sufficient height above annulus for deployment

Optimum Fluoroscopic Angle for Delivery: LAO 25 Caudal 2 degrees
IMPRESSION: 1. Calcified tri leaflet AV with annular area of 373 mm2 suitable
for a 23 mm Sapien 3 valve

2.  Coronary arteries sufficient height above annulus for deployment

3.  Normal aortic  root 2.8 cm

4. Optimum angiographic angle for deployment LAO 25 Caudal 2 degrees

5.  No AUAD thrombus

Naama Lawrence

EXAM:
OVER-READ INTERPRETATION  CT CHEST

The following report is an over-read performed by radiologist Dr.
Ferienhaus Erxleben [[email or web address]] on 08/15/2018. This
over-read does not include interpretation of cardiac or coronary
anatomy or pathology. The coronary calcium score/coronary CTA
interpretation by the cardiologist is attached.
FINDINGS: Extracardiac findings will be described separately under dictation
for contemporaneously obtained CTA chest, abdomen and pelvis..
IMPRESSION: Please see separate dictation for contemporaneously obtained CTA
chest, abdomen and pelvis dated 08/15/2018 for full description of
relevant extracardiac findings.

## 2020-01-29 ENCOUNTER — Other Ambulatory Visit: Payer: Self-pay | Admitting: Physician Assistant

## 2020-03-04 DIAGNOSIS — M79622 Pain in left upper arm: Secondary | ICD-10-CM | POA: Diagnosis not present

## 2020-03-04 DIAGNOSIS — M25512 Pain in left shoulder: Secondary | ICD-10-CM | POA: Diagnosis not present

## 2020-03-08 DIAGNOSIS — M1990 Unspecified osteoarthritis, unspecified site: Secondary | ICD-10-CM | POA: Diagnosis not present

## 2020-03-08 DIAGNOSIS — M25511 Pain in right shoulder: Secondary | ICD-10-CM | POA: Diagnosis not present

## 2020-03-08 DIAGNOSIS — M353 Polymyalgia rheumatica: Secondary | ICD-10-CM | POA: Diagnosis not present

## 2020-03-08 DIAGNOSIS — I10 Essential (primary) hypertension: Secondary | ICD-10-CM | POA: Diagnosis not present

## 2020-03-08 DIAGNOSIS — M7550 Bursitis of unspecified shoulder: Secondary | ICD-10-CM | POA: Diagnosis not present

## 2020-03-08 DIAGNOSIS — M25512 Pain in left shoulder: Secondary | ICD-10-CM | POA: Diagnosis not present

## 2020-03-08 DIAGNOSIS — M12812 Other specific arthropathies, not elsewhere classified, left shoulder: Secondary | ICD-10-CM | POA: Diagnosis not present

## 2020-03-08 DIAGNOSIS — M25519 Pain in unspecified shoulder: Secondary | ICD-10-CM | POA: Diagnosis not present

## 2020-04-12 ENCOUNTER — Encounter (INDEPENDENT_AMBULATORY_CARE_PROVIDER_SITE_OTHER): Payer: Medicare Other | Admitting: Ophthalmology

## 2020-04-12 ENCOUNTER — Other Ambulatory Visit: Payer: Self-pay

## 2020-04-12 DIAGNOSIS — I10 Essential (primary) hypertension: Secondary | ICD-10-CM | POA: Diagnosis not present

## 2020-04-12 DIAGNOSIS — H353132 Nonexudative age-related macular degeneration, bilateral, intermediate dry stage: Secondary | ICD-10-CM

## 2020-04-12 DIAGNOSIS — H43813 Vitreous degeneration, bilateral: Secondary | ICD-10-CM

## 2020-04-12 DIAGNOSIS — H35372 Puckering of macula, left eye: Secondary | ICD-10-CM

## 2020-04-12 DIAGNOSIS — H35033 Hypertensive retinopathy, bilateral: Secondary | ICD-10-CM

## 2020-06-17 DIAGNOSIS — S42251A Displaced fracture of greater tuberosity of right humerus, initial encounter for closed fracture: Secondary | ICD-10-CM | POA: Diagnosis not present

## 2020-06-17 DIAGNOSIS — M25511 Pain in right shoulder: Secondary | ICD-10-CM | POA: Diagnosis not present

## 2020-06-23 ENCOUNTER — Other Ambulatory Visit: Payer: Self-pay

## 2020-06-23 ENCOUNTER — Ambulatory Visit: Payer: Medicare Other

## 2020-06-23 DIAGNOSIS — I6523 Occlusion and stenosis of bilateral carotid arteries: Secondary | ICD-10-CM

## 2020-06-24 ENCOUNTER — Other Ambulatory Visit: Payer: Self-pay | Admitting: Cardiology

## 2020-06-24 DIAGNOSIS — I6523 Occlusion and stenosis of bilateral carotid arteries: Secondary | ICD-10-CM

## 2020-06-24 DIAGNOSIS — S42251D Displaced fracture of greater tuberosity of right humerus, subsequent encounter for fracture with routine healing: Secondary | ICD-10-CM | POA: Diagnosis not present

## 2020-07-01 DIAGNOSIS — S42251D Displaced fracture of greater tuberosity of right humerus, subsequent encounter for fracture with routine healing: Secondary | ICD-10-CM | POA: Diagnosis not present

## 2020-07-01 DIAGNOSIS — M25511 Pain in right shoulder: Secondary | ICD-10-CM | POA: Diagnosis not present

## 2020-07-09 ENCOUNTER — Other Ambulatory Visit: Payer: Self-pay

## 2020-07-09 ENCOUNTER — Ambulatory Visit: Payer: Medicare Other | Admitting: Cardiology

## 2020-07-09 ENCOUNTER — Telehealth: Payer: Self-pay | Admitting: Cardiology

## 2020-07-09 ENCOUNTER — Encounter: Payer: Self-pay | Admitting: Cardiology

## 2020-07-09 VITALS — BP 120/76 | HR 69 | Resp 16 | Ht 63.0 in | Wt 177.8 lb

## 2020-07-09 DIAGNOSIS — R0789 Other chest pain: Secondary | ICD-10-CM

## 2020-07-09 DIAGNOSIS — I10 Essential (primary) hypertension: Secondary | ICD-10-CM | POA: Diagnosis not present

## 2020-07-09 DIAGNOSIS — I359 Nonrheumatic aortic valve disorder, unspecified: Secondary | ICD-10-CM

## 2020-07-09 DIAGNOSIS — E7800 Pure hypercholesterolemia, unspecified: Secondary | ICD-10-CM | POA: Diagnosis not present

## 2020-07-09 DIAGNOSIS — I6523 Occlusion and stenosis of bilateral carotid arteries: Secondary | ICD-10-CM | POA: Diagnosis not present

## 2020-07-09 DIAGNOSIS — Z952 Presence of prosthetic heart valve: Secondary | ICD-10-CM | POA: Diagnosis not present

## 2020-07-09 NOTE — Telephone Encounter (Signed)
Guidelines recommend annual echo for patients with a TAVR valve :) So she would be due next month ish

## 2020-07-09 NOTE — Progress Notes (Signed)
Primary Physician:  Jani Gravel, MD   Patient ID: Zoe [name], female    DOB: 17-Oct-1934, 86 y.o.   MRN: [id number]  Subjective:    Chief Complaint  Patient presents with  . Hypertension  . Carotid Stenosis  . AV Replacement  . Follow-up    6 month    HPI: Zoe [name]  is a 86 y.o. female  with history of possibly mild coronary spasm by coronary angiogram in 2014 when she presented with angina pectoris, otherwise no significant CAD, severe AS s/p TAVR by Dr. Roxy Manns on 11/12., asymptomatic carotid artery stenosis, essential hypertension, hyperlipidemia, and GERD.   She is presently doing well except for mild chronic dyspnea, she is now exercising on a regular basis and dyspnea has improved.  No leg edema, no PND or orthopnea. Denies TIA, denies headache, visual disturbances. She had a mechanical fall and has fractured her right humerus but did not need surgery, and is presently on a sling.  Since then she has noticed occasional episodes of sharp chest pain in the right upper chest and also in the lower part of the precordium comes on and off lasting a few seconds.  Past Medical History:  Diagnosis Date  . Arthritis    some - per patient  . Breast cancer ([place])    breast cancer / left   . Cataract    bilat   . GERD (gastroesophageal reflux disease)   . History of kidney stones   . Hyperlipidemia   . Hypertension   . Hypothyroidism   . Macular degeneration    Left  . S/P TAVR (transcatheter aortic valve replacement) 09/03/2018   23 mm Edwards Sapien 3 transcatheter heart valve placed via percutaneous right transfemoral approach   . Severe aortic stenosis   . Stress incontinence   . Thyroid disease   . Tinnitus     Past Surgical History:  Procedure Laterality Date  . ABDOMINAL HYSTERECTOMY  1970's  . BACK SURGERY    . BREAST LUMPECTOMY  12/1998   lumpectomy  . CARDIAC CATHETERIZATION    . EYE SURGERY     cataract surgery bilat   . INTRAOPERATIVE  TRANSTHORACIC ECHOCARDIOGRAM N/A 09/03/2018   Procedure: INTRAOPERATIVE TRANSTHORACIC ECHOCARDIOGRAM;  Surgeon: Burnell Blanks, MD;  Location: [facility];  Service: Open Heart Surgery;  Laterality: N/A;  . LITHOTRIPSY    . Right total knee     2018 Dr. Alvan Dame  . RIGHT/LEFT HEART CATH AND CORONARY ANGIOGRAPHY N/A 08/06/2018   Procedure: RIGHT/LEFT HEART CATH AND CORONARY ANGIOGRAPHY;  Surgeon: Adrian Prows, MD;  Location: [facility];  Service: Cardiovascular;  Laterality: N/A;  . THYROIDECTOMY, PARTIAL  1975  . TONSILLECTOMY     as a child - patient not sure of exact date  . TOTAL KNEE ARTHROPLASTY Left 03/13/2016   Procedure: TOTAL KNEE ARTHROPLASTY;  Surgeon: Paralee Cancel, MD;  Location: [facility];  Service: Orthopedics;  Laterality: Left;  . TOTAL KNEE ARTHROPLASTY Right 06/18/2017   Procedure: RIGHT TOTAL KNEE ARTHROPLASTY;  Surgeon: Paralee Cancel, MD;  Location: [facility];  Service: Orthopedics;  Laterality: Right;  . TRANSCATHETER AORTIC VALVE REPLACEMENT, TRANSFEMORAL N/A 09/03/2018   Procedure: TRANSCATHETER AORTIC VALVE REPLACEMENT, TRANSFEMORAL;  Surgeon: Burnell Blanks, MD;  Location: [facility];  Service: Open Heart Surgery;  Laterality: N/A;   Social History   Tobacco Use  . Smoking status: Never Smoker  . Smokeless tobacco: Never Used  Substance Use Topics  . Alcohol use: No  Review of Systems  Cardiovascular: Positive for chest pain and dyspnea on exertion (stable). Negative for leg swelling.  Musculoskeletal: Positive for arthritis, back pain and joint pain (right shoulder since fall).  Gastrointestinal: Negative for melena.   Objective:  Blood pressure 120/76, pulse 69, resp. rate 16, height 5\' 3"  (1.6 m), weight 177 lb 12.8 oz (80.6 kg), SpO2 97 %. Body mass index is 31.5 kg/m.  Vitals with BMI 07/09/2020 01/07/2020 08/20/2019  Height 5\' 3"  5\' 3"  5\' 3"   Weight 177 lbs 13 oz 180 lbs 178 lbs 3 oz  BMI 31.5 96.22 29.79  Systolic 892 119 417  Diastolic 76 80  76  Pulse 69 68 71      Physical Exam Vitals reviewed.  Constitutional:      Appearance: She is well-developed.  Cardiovascular:     Rate and Rhythm: Normal rate and regular rhythm.     Pulses: Normal pulses and intact distal pulses.          Carotid pulses are on the right side with bruit.    Heart sounds: Murmur heard.  Early systolic murmur is present with a grade of 1/6 at the upper right sternal border.   Pulmonary:     Effort: Pulmonary effort is normal. No accessory muscle usage or respiratory distress.     Breath sounds: Normal breath sounds.  Abdominal:     General: Bowel sounds are normal.     Palpations: Abdomen is soft.  Neurological:     Mental Status: She is alert and oriented to person, place, and time.    Radiology: No results found.  Laboratory examination:   CMP Latest Ref Rng & Units 07/16/2019 09/04/2018 09/03/2018  Glucose 65 - 99 mg/dL 101(H) 108(H) 127(H)  BUN 8 - 27 mg/dL 23 18 17   Creatinine 0.57 - 1.00 mg/dL 0.81 0.75 0.60  Sodium 134 - 144 mmol/L 132(L) 132(L) 138  Potassium 3.5 - 5.2 mmol/L 4.3 4.1 4.0  Chloride 96 - 106 mmol/L 96 99 99  CO2 20 - 29 mmol/L 23 28 -  Calcium 8.7 - 10.3 mg/dL 8.7 8.5(L) -  Total Protein 6.0 - 8.5 g/dL 9.2(H) - -  Total Bilirubin 0.0 - 1.2 mg/dL 0.3 - -  Alkaline Phos 39 - 117 IU/L 59 - -  AST 0 - 40 IU/L 18 - -  ALT 0 - 32 IU/L 9 - -   CBC Latest Ref Rng & Units 07/16/2019 09/04/2018 09/03/2018  WBC 3.4 - 10.8 x10E3/uL 5.7 8.3 -  Hemoglobin 11.1 - 15.9 g/dL 10.4(L) 9.5(L) 10.9(L)  Hematocrit 34.0 - 46.6 % 32.0(L) 31.3(L) 32.0(L)  Platelets 150 - 450 x10E3/uL 194 149(L) -   Lipid Panel Recent Labs    07/16/19 0941  CHOL 103  TRIG 75  LDLCALC 39  HDL 48  LDLDIRECT 48    HEMOGLOBIN A1C Lab Results  Component Value Date   HGBA1C 5.8 (H) 07/16/2019   MPG 111.15 08/30/2018   TSH Recent Labs    07/16/19 0941  TSH 0.719   External labs  Cholesterol, total 103.000 07/16/2019 HDL 48.000  07/16/2019 LDL 48.000 07/16/2019 Triglycerides 75.000 07/16/2019  A1C 5.800 07/16/2019, TSH 0.719 07/16/2019  Hemoglobin 10.400 G/ 07/16/2019; INR 1.180 08/30/2018 Platelets 194.000 07/16/2019:.  Creatinine, Serum 0.810 07/16/2019 Potassium 4.300 07/16/2019 ALT (SGPT) 9.000 07/16/2019  Current Outpatient Medications on File Prior to Visit  Medication Sig Dispense Refill  . Artificial Tear Solution (SOOTHE XP) SOLN Place 1 drop into both eyes every evening.    Marland Kitchen  aspirin 81 MG chewable tablet Chew 1 tablet (81 mg total) by mouth daily.    . carvedilol (COREG) 12.5 MG tablet Take 12.5 mg by mouth 2 (two) times daily with a meal.    . Cholecalciferol (VITAMIN D3) 2000 units TABS Take 2,000 Units by mouth daily.    . hydrochlorothiazide (MICROZIDE) 12.5 MG capsule Take 6.25 mg by mouth daily.    Marland Kitchen levothyroxine (SYNTHROID, LEVOTHROID) 100 MCG tablet Take 100 mcg by mouth daily before breakfast.  2  . Multiple Vitamins-Minerals (PRESERVISION AREDS 2) CAPS Take 1 capsule by mouth 2 (two) times daily.    . pantoprazole (PROTONIX) 40 MG tablet TAKE 1 TABLET BY MOUTH EVERY DAY 90 tablet 3  . pravastatin (PRAVACHOL) 40 MG tablet Take 40 mg by mouth every evening.    . valsartan (DIOVAN) 320 MG tablet Take 1 tablet (320 mg total) by mouth daily. 90 tablet 3   No current facility-administered medications on file prior to visit.     Cardiac Studies:   Coronary angiogram 08/06/2018: normal coronary arteries. Moderate pulmonary hypertension due to AS with elevated PW. Preserved CO and CI.  TAVR 09/03/2018: Edwards Sapien 3 THV (size 23 mm, model # 9600TFX, serial # [id number]) by Lilly Cove and Darlina Guys.  CXR PA/LAT 11/07/2018:  Cardiac shadow is enlarged but stable. Changes of prior TAVR ar again noted. Aortic calcifications are seen. Previously noted left upper lobe pneumonia has resolved in the interval. No focal infiltrate or sizable effusion is seen. Changes of prior vertebral augmentation are  noted.  Echocardiogram 08/20/2019:  1. 23 mm S3 in aortic position. Vmax 2.6 m/s, MG 16 mmHg, AVA 1.08 cm2.  Normally functioning TAVR valve without evidence of paravalvular leak. No  change from 10/03/2018.  Aortic valve regurgitation is not visualized.  2. Left ventricular ejection fraction, by visual estimation, is 70 to 75%. The left ventricle has hyperdynamic function. There is mildly  increased left ventricular hypertrophy. The left ventricle has no regional wall motion abnormalities.  3. Global right ventricle has normal systolic function.The right ventricular size is normal. No increase in right ventricular wall thickness.  4. Left atrial size was normal.  5. Right atrial size was normal.  6. Presence of pericardial fat pad.  7. Mild thickening of the anterior and posterior mitral valve leaflet(s). Moderate mitral annular calcification.  Trace mitral valve regurgitation.  8. The tricuspid valve is grossly normal. The tricuspid regurgitant velocity is 2.36 m/s, and with an assumed right atrial pressure of 3 mmHg, the estimated right ventricular systolic  pressure is normal at 25.3 mmHg.  In comparison, no change from 10/03/2018.  Carotid artery duplex 06/23/2020:  Duplex suggests stenosis in the right internal carotid artery (16-49%).  Duplex suggests stenosis in the left internal carotid artery (50-69%). Stenosis in the left external carotid artery (<50%).  Antegrade right vertebral artery flow. Antegrade left vertebral artery flow.  Follow up in six months is appropriate if clinically indicated. No significant change since 12/29/2019.  EKG   EKG 07/09/2020: Normal sinus rhythm with rate of 71 bpm, left atrial enlargement, normal axis.  No evidence of ischemia, otherwise normal EKG. No significant change from EKG 01/07/2020.  Assessment:     ICD-10-CM   1. Asymptomatic bilateral carotid artery stenosis  I65.23   2. Chest discomfort  R07.89   3. TAVR 09/03/2018: Edwards Sapien  3 THV (size 23 mm, model # 9600TFX, serial # [id number])  Z95.2 PCV ECHOCARDIOGRAM COMPLETE  4. Essential hypertension  I10  EKG 12-Lead  5. Hypercholesteremia  E78.00   6. Aortic valve disorder  I35.9 PCV ECHOCARDIOGRAM COMPLETE  No orders of the defined types were placed in this encounter.  There are no discontinued medications.  Recommendations:   Zoe [name] is a fairly active 86 y.o. Caucasian female with history of possibly mild coronary spasm by coronary angiogram in 2014,  severe AS s/p TAVR by Dr. Roxy Manns on 09/03/2018, asymptomatic bilateral carotid artery stenosis, essential hypertension, hyperlipidemia, hyperglycemia and GERD.  She is here on a six--month office visit and follow-up. Her EKG is normal, physical examination is unchanged.  Prosthetic AV is functioning normally. Dyspnea is class I-II, no chf on exam. Will schedule for annual echo for assessment of TAVR.  Since her fall and minor fracture of her humerus, she has noticed chest pain which clearly appears to be musculoskeletal.  EKG is normal.  I reassured her.   With regard to carotid artery stenosis, I think overall it is pretty fairly stable since 2019 February.  I simply reassured her.  She is on appropriate medical therapy. Lipids are at goal, normal renal function and good BP control.  She will have labs done again as it is almost a year ago with her PCP.  She will see me back  in 6 months.   Adrian Prows, MD, [facility] 07/10/2020, 7:40 AM Office: [phone]

## 2020-07-10 NOTE — Telephone Encounter (Signed)
Do you want me to document class and sent notes to you and for how long?

## 2020-07-10 NOTE — Telephone Encounter (Signed)
Nope! I just need those parameters for the 1 month and 1 year notes. After that the registry stops following :) You can just follow her overtime with echos annually

## 2020-07-19 DIAGNOSIS — M25511 Pain in right shoulder: Secondary | ICD-10-CM | POA: Diagnosis not present

## 2020-07-19 DIAGNOSIS — M79601 Pain in right arm: Secondary | ICD-10-CM | POA: Diagnosis not present

## 2020-07-27 ENCOUNTER — Ambulatory Visit: Payer: Medicare Other | Attending: Internal Medicine

## 2020-07-27 DIAGNOSIS — Z23 Encounter for immunization: Secondary | ICD-10-CM

## 2020-07-27 NOTE — Progress Notes (Signed)
   Covid-19 Vaccination Clinic  Name:  Zoe [name]    MRN: [id number] DOB: Sep 08, 1934  07/27/2020  [name] was observed post Covid-19 immunization for 15 minutes without incident. She was provided with Vaccine Information Sheet and instruction to access the V-Safe system.   [name] was instructed to call 911 with any severe reactions post vaccine: Marland Kitchen Difficulty breathing  . Swelling of face and throat  . A fast heartbeat  . A bad rash all over body  . Dizziness and weakness

## 2020-07-28 DIAGNOSIS — M79601 Pain in right arm: Secondary | ICD-10-CM | POA: Diagnosis not present

## 2020-07-28 DIAGNOSIS — M25511 Pain in right shoulder: Secondary | ICD-10-CM | POA: Diagnosis not present

## 2020-07-29 DIAGNOSIS — S42251D Displaced fracture of greater tuberosity of right humerus, subsequent encounter for fracture with routine healing: Secondary | ICD-10-CM | POA: Diagnosis not present

## 2020-08-03 DIAGNOSIS — M79601 Pain in right arm: Secondary | ICD-10-CM | POA: Diagnosis not present

## 2020-08-03 DIAGNOSIS — M25511 Pain in right shoulder: Secondary | ICD-10-CM | POA: Diagnosis not present

## 2020-08-05 DIAGNOSIS — M79601 Pain in right arm: Secondary | ICD-10-CM | POA: Diagnosis not present

## 2020-08-05 DIAGNOSIS — M25511 Pain in right shoulder: Secondary | ICD-10-CM | POA: Diagnosis not present

## 2020-08-10 DIAGNOSIS — M79601 Pain in right arm: Secondary | ICD-10-CM | POA: Diagnosis not present

## 2020-08-10 DIAGNOSIS — M25511 Pain in right shoulder: Secondary | ICD-10-CM | POA: Diagnosis not present

## 2020-08-12 DIAGNOSIS — M79601 Pain in right arm: Secondary | ICD-10-CM | POA: Diagnosis not present

## 2020-08-12 DIAGNOSIS — M25511 Pain in right shoulder: Secondary | ICD-10-CM | POA: Diagnosis not present

## 2020-08-17 DIAGNOSIS — M25511 Pain in right shoulder: Secondary | ICD-10-CM | POA: Diagnosis not present

## 2020-08-17 DIAGNOSIS — M79601 Pain in right arm: Secondary | ICD-10-CM | POA: Diagnosis not present

## 2020-08-19 DIAGNOSIS — M79601 Pain in right arm: Secondary | ICD-10-CM | POA: Diagnosis not present

## 2020-08-19 DIAGNOSIS — M25511 Pain in right shoulder: Secondary | ICD-10-CM | POA: Diagnosis not present

## 2020-08-24 DIAGNOSIS — M25511 Pain in right shoulder: Secondary | ICD-10-CM | POA: Diagnosis not present

## 2020-08-24 DIAGNOSIS — M79601 Pain in right arm: Secondary | ICD-10-CM | POA: Diagnosis not present

## 2020-08-26 DIAGNOSIS — M79601 Pain in right arm: Secondary | ICD-10-CM | POA: Diagnosis not present

## 2020-08-26 DIAGNOSIS — M25511 Pain in right shoulder: Secondary | ICD-10-CM | POA: Diagnosis not present

## 2020-08-30 DIAGNOSIS — M79601 Pain in right arm: Secondary | ICD-10-CM | POA: Diagnosis not present

## 2020-08-30 DIAGNOSIS — M25511 Pain in right shoulder: Secondary | ICD-10-CM | POA: Diagnosis not present

## 2020-09-02 DIAGNOSIS — M79601 Pain in right arm: Secondary | ICD-10-CM | POA: Diagnosis not present

## 2020-09-02 DIAGNOSIS — M25511 Pain in right shoulder: Secondary | ICD-10-CM | POA: Diagnosis not present

## 2020-09-14 ENCOUNTER — Other Ambulatory Visit: Payer: Self-pay | Admitting: Cardiology

## 2020-09-14 DIAGNOSIS — I10 Essential (primary) hypertension: Secondary | ICD-10-CM

## 2020-09-21 DIAGNOSIS — E559 Vitamin D deficiency, unspecified: Secondary | ICD-10-CM | POA: Diagnosis not present

## 2020-09-21 DIAGNOSIS — E039 Hypothyroidism, unspecified: Secondary | ICD-10-CM | POA: Diagnosis not present

## 2020-09-21 DIAGNOSIS — E7800 Pure hypercholesterolemia, unspecified: Secondary | ICD-10-CM | POA: Diagnosis not present

## 2020-09-21 DIAGNOSIS — E119 Type 2 diabetes mellitus without complications: Secondary | ICD-10-CM | POA: Diagnosis not present

## 2020-09-21 DIAGNOSIS — I10 Essential (primary) hypertension: Secondary | ICD-10-CM | POA: Diagnosis not present

## 2020-09-29 DIAGNOSIS — I10 Essential (primary) hypertension: Secondary | ICD-10-CM | POA: Diagnosis not present

## 2020-09-29 DIAGNOSIS — Z23 Encounter for immunization: Secondary | ICD-10-CM | POA: Diagnosis not present

## 2020-09-29 DIAGNOSIS — R7303 Prediabetes: Secondary | ICD-10-CM | POA: Diagnosis not present

## 2020-09-29 DIAGNOSIS — E039 Hypothyroidism, unspecified: Secondary | ICD-10-CM | POA: Diagnosis not present

## 2020-09-29 DIAGNOSIS — Z0000 Encounter for general adult medical examination without abnormal findings: Secondary | ICD-10-CM | POA: Diagnosis not present

## 2020-11-19 DIAGNOSIS — H3530 Unspecified macular degeneration: Secondary | ICD-10-CM | POA: Diagnosis not present

## 2020-12-24 ENCOUNTER — Other Ambulatory Visit: Payer: Self-pay | Admitting: Physician Assistant

## 2020-12-30 ENCOUNTER — Other Ambulatory Visit: Payer: Medicare Other

## 2021-01-05 ENCOUNTER — Other Ambulatory Visit: Payer: Self-pay

## 2021-01-05 ENCOUNTER — Ambulatory Visit: Payer: Medicare Other

## 2021-01-05 DIAGNOSIS — I6523 Occlusion and stenosis of bilateral carotid arteries: Secondary | ICD-10-CM

## 2021-01-06 ENCOUNTER — Ambulatory Visit: Payer: Medicare Other | Admitting: Cardiology

## 2021-01-17 ENCOUNTER — Ambulatory Visit: Payer: Medicare Other | Admitting: Cardiology

## 2021-01-17 ENCOUNTER — Other Ambulatory Visit: Payer: Self-pay

## 2021-01-17 ENCOUNTER — Encounter: Payer: Self-pay | Admitting: Cardiology

## 2021-01-17 VITALS — BP 118/70 | HR 74 | Temp 98.3°F | Resp 17 | Ht 63.0 in | Wt 175.4 lb

## 2021-01-17 DIAGNOSIS — I6523 Occlusion and stenosis of bilateral carotid arteries: Secondary | ICD-10-CM

## 2021-01-17 DIAGNOSIS — R0600 Dyspnea, unspecified: Secondary | ICD-10-CM

## 2021-01-17 DIAGNOSIS — Z952 Presence of prosthetic heart valve: Secondary | ICD-10-CM

## 2021-01-17 DIAGNOSIS — E7800 Pure hypercholesterolemia, unspecified: Secondary | ICD-10-CM | POA: Diagnosis not present

## 2021-01-17 DIAGNOSIS — R0609 Other forms of dyspnea: Secondary | ICD-10-CM

## 2021-01-17 DIAGNOSIS — I10 Essential (primary) hypertension: Secondary | ICD-10-CM

## 2021-01-17 NOTE — Progress Notes (Signed)
Primary Physician:  Jani Gravel, MD   Patient ID: Zoe [name], female    DOB: 04/24/1934, 87 y.o.   MRN: [id number]  Subjective:    Chief Complaint  Patient presents with  . TAVR  . Hypertension  . Hyperlipidemia  . Follow-up    6 MONTH  . Shortness of Breath    HPI: Zoe [name]  is a 87 y.o. female  with history of possibly mild coronary spasm by coronary angiogram in 2014 when she presented with angina pectoris, otherwise no significant CAD, severe AS s/p TAVR by Dr. Roxy Manns on 11/12., asymptomatic carotid artery stenosis, essential hypertension, hyperlipidemia, and GERD.   She is presently doing well except for mild chronic dyspnea, she has stopped exercising again for no particular reason. No PND or orthopnea.  Denies TIA, denies headache, visual disturbances.   Past Medical History:  Diagnosis Date  . Arthritis    some - per patient  . Breast cancer ([place])    breast cancer / left   . Cataract    bilat   . GERD (gastroesophageal reflux disease)   . History of kidney stones   . Hyperlipidemia   . Hypertension   . Hypothyroidism   . Macular degeneration    Left  . S/P TAVR (transcatheter aortic valve replacement) 09/03/2018   23 mm Edwards Sapien 3 transcatheter heart valve placed via percutaneous right transfemoral approach   . Severe aortic stenosis   . Stress incontinence   . Thyroid disease   . Tinnitus     Past Surgical History:  Procedure Laterality Date  . ABDOMINAL HYSTERECTOMY  1970's  . BACK SURGERY    . BREAST LUMPECTOMY  12/1998   lumpectomy  . CARDIAC CATHETERIZATION    . EYE SURGERY     cataract surgery bilat   . INTRAOPERATIVE TRANSTHORACIC ECHOCARDIOGRAM N/A 09/03/2018   Procedure: INTRAOPERATIVE TRANSTHORACIC ECHOCARDIOGRAM;  Surgeon: Burnell Blanks, MD;  Location: [facility];  Service: Open Heart Surgery;  Laterality: N/A;  . LITHOTRIPSY    . Right total knee     2018 Dr. Alvan Dame  . RIGHT/LEFT HEART CATH AND CORONARY  ANGIOGRAPHY N/A 08/06/2018   Procedure: RIGHT/LEFT HEART CATH AND CORONARY ANGIOGRAPHY;  Surgeon: Adrian Prows, MD;  Location: [facility];  Service: Cardiovascular;  Laterality: N/A;  . THYROIDECTOMY, PARTIAL  1975  . TONSILLECTOMY     as a child - patient not sure of exact date  . TOTAL KNEE ARTHROPLASTY Left 03/13/2016   Procedure: TOTAL KNEE ARTHROPLASTY;  Surgeon: Paralee Cancel, MD;  Location: [facility];  Service: Orthopedics;  Laterality: Left;  . TOTAL KNEE ARTHROPLASTY Right 06/18/2017   Procedure: RIGHT TOTAL KNEE ARTHROPLASTY;  Surgeon: Paralee Cancel, MD;  Location[facility];  Service: Orthopedics;  Laterality: Right;  . TRANSCATHETER AORTIC VALVE REPLACEMENT, TRANSFEMORAL N/A 09/03/2018   Procedure: TRANSCATHETER AORTIC VALVE REPLACEMENT, TRANSFEMORAL;  Surgeon: Burnell Blanks, MD;  Location: [facility];  Service: Open Heart Surgery;  Laterality: N/A;   Social History   Tobacco Use  . Smoking status: Never Smoker  . Smokeless tobacco: Never Used  Substance Use Topics  . Alcohol use: No     Review of Systems  Cardiovascular: Positive for dyspnea on exertion. Negative for chest pain and leg swelling.  Musculoskeletal: Positive for arthritis (left shoulder).  Gastrointestinal: Negative for melena.   Objective:  Blood pressure 118/70, pulse 74, temperature 98.3 F (36.8 C), temperature source Temporal, resp. rate 17, height 5' 3" (1.6 m),  weight 175 lb 6.4 oz (79.6 kg), SpO2 96 %. Body mass index is 31.07 kg/m.  Vitals with BMI 01/17/2021 07/09/2020 01/07/2020  Height 5' 3" 5' 3" 5' 3"  Weight 175 lbs 6 oz 177 lbs 13 oz 180 lbs  BMI 31.08 24.2 35.36  Systolic 144 315 400  Diastolic 70 76 80  Pulse 74 69 68      Physical Exam Vitals reviewed.  Constitutional:      Appearance: She is well-developed.  Cardiovascular:     Rate and Rhythm: Normal rate and regular rhythm.     Pulses: Normal pulses and intact distal pulses.          Carotid pulses are on the right side  with bruit and on the left side with bruit.    Heart sounds: Murmur heard.   Early systolic murmur is present with a grade of 1/6 at the upper right sternal border.   Pulmonary:     Effort: Pulmonary effort is normal. No accessory muscle usage or respiratory distress.     Breath sounds: Normal breath sounds.  Abdominal:     General: Bowel sounds are normal.     Palpations: Abdomen is soft.  Musculoskeletal:        General: Normal range of motion.     Right lower leg: No edema.  Skin:    Capillary Refill: Capillary refill takes less than 2 seconds.  Neurological:     General: No focal deficit present.     Mental Status: She is alert and oriented to person, place, and time.    Radiology: No results found.  Laboratory examination:    External labs  Labs 09/21/2020:  Hb 10.5/HCT 32.8, platelets 221.  Serum glucose 107 mg, BUN 17, creatinine 0.78, EGFR 70 mL, potassium 4.5, CMP otherwise normal.  A1c 6.4%.  TSH normal.  Vitamin D 38.3.  Total cholesterol 112, triglycerides 82, HDL 46, LDL 50.  Cholesterol, total 103.000 07/16/2019 HDL 48.000 07/16/2019 LDL 48.000 07/16/2019 Triglycerides 75.000 07/16/2019  A1C 5.800 07/16/2019, TSH 0.719 07/16/2019  Hemoglobin 10.400 G/ 07/16/2019; INR 1.180 08/30/2018 Platelets 194.000 07/16/2019:.  Creatinine, Serum 0.810 07/16/2019 Potassium 4.300 07/16/2019 ALT (SGPT) 9.000 07/16/2019  Current Outpatient Medications on File Prior to Visit  Medication Sig Dispense Refill  . Artificial Tear Solution (SOOTHE XP) SOLN Place 1 drop into both eyes every evening.    Marland Kitchen aspirin 81 MG chewable tablet Chew 1 tablet (81 mg total) by mouth daily.    . carvedilol (COREG) 12.5 MG tablet Take 12.5 mg by mouth 2 (two) times daily with a meal.    . Cholecalciferol (VITAMIN D3) 2000 units TABS Take 2,000 Units by mouth daily.    . hydrochlorothiazide (MICROZIDE) 12.5 MG capsule Take 6.25 mg by mouth daily.    Marland Kitchen levothyroxine (SYNTHROID, LEVOTHROID) 100  MCG tablet Take 100 mcg by mouth daily before breakfast.  2  . losartan (COZAAR) 25 MG tablet Take 1 tablet by mouth daily.    . Multiple Vitamins-Minerals (PRESERVISION AREDS 2) CAPS Take 1 capsule by mouth 2 (two) times daily.    . pantoprazole (PROTONIX) 40 MG tablet TAKE 1 TABLET BY MOUTH EVERY DAY 90 tablet 3  . pravastatin (PRAVACHOL) 40 MG tablet Take 40 mg by mouth every evening.    . valsartan (DIOVAN) 320 MG tablet TAKE 1 TABLET BY MOUTH EVERY DAY 90 tablet 3   No current facility-administered medications on file prior to visit.     Cardiac Studies:   Coronary  angiogram 08/06/2018: normal coronary arteries. Moderate pulmonary hypertension due to AS with elevated PW. Preserved CO and CI.  TAVR 09/03/2018: Edwards Sapien 3 THV (size 23 mm, model # 9600TFX, serial # [id number]) by Lilly Cove and Darlina Guys.  CXR PA/LAT 11/07/2018:  Cardiac shadow is enlarged but stable. Changes of prior TAVR ar again noted. Aortic calcifications are seen. Previously noted left upper lobe pneumonia has resolved in the interval. No focal infiltrate or sizable effusion is seen. Changes of prior vertebral augmentation are noted.  Echocardiogram 08/20/2019:  1. 23 mm S3 in aortic position. Vmax 2.6 m/s, MG 16 mmHg, AVA 1.08 cm2.  Normally functioning TAVR valve without evidence of paravalvular leak. No  change from 10/03/2018.  Aortic valve regurgitation is not visualized.  2. Left ventricular ejection fraction, by visual estimation, is 70 to 75%. The left ventricle has hyperdynamic function. There is mildly  increased left ventricular hypertrophy. The left ventricle has no regional wall motion abnormalities.  3. Global right ventricle has normal systolic function.The right ventricular size is normal. No increase in right ventricular wall thickness.  4. Left atrial size was normal.  5. Right atrial size was normal.  6. Presence of pericardial fat pad.  7. Mild thickening of the anterior and posterior  mitral valve leaflet(s). Moderate mitral annular calcification.  Trace mitral valve regurgitation.  8. The tricuspid valve is grossly normal. The tricuspid regurgitant velocity is 2.36 m/s, and with an assumed right atrial pressure of 3 mmHg, the estimated right ventricular systolic  pressure is normal at 25.3 mmHg.  In comparison, no change from 10/03/2018.  Carotid artery duplex 01/05/2021: Minimal stenosis in the right internal carotid artery (1-15%). Stenosis in the right external carotid artery (<50%). Duplex suggests stenosis in the left internal carotid artery (50-69%). Stenosis in the left external carotid artery (<50%). Antegrade right vertebral artery flow. Antegrade left vertebral artery flow. Follow up in six months is appropriate if clinically indicated.  No significant change since 06/23/2020.  EKG     EKG 01/17/2021: Normal sinus rhythm with rate of 70 bpm, left atrial dramality, normal axis.  No evidence of ischemia, otherwise normal EKG.  No significant change from EKG 07/09/2020.  Assessment:     ICD-10-CM   1. Dyspnea on exertion  R06.00   2. Primary hypertension  I10 EKG 12-Lead  3. TAVR 09/03/2018: Edwards Sapien 3 THV (size 23 mm, model # 9600TFX, serial # [id number])  Z95.2   4. Asymptomatic bilateral carotid artery stenosis  I65.23   5. Hypercholesteremia  E78.00   No orders of the defined types were placed in this encounter.  There are no discontinued medications.  Recommendations:   Zoe [name] is a fairly active 87 y.o. Caucasian female with history of possibly mild coronary spasm by coronary angiogram in 2014,  severe AS s/p TAVR by Dr. Roxy Manns on 09/03/2018, asymptomatic bilateral carotid artery stenosis, essential hypertension, hyperlipidemia, hyperglycemia and GERD.  She is here on a six--month office visit and follow-up. Her EKG is normal, physical examination is unchanged.  Prosthetic AV is functioning normally. Dyspnea is class I-II, no chf on  exam.  SPECT her dyspnea is related to deconditioning.  I have again encouraged her to start an exercise program.  Will schedule for annual echo for assessment of TAVR.    Carotid artery stenosis is remained stable.  I simply reassured her.  She is on appropriate medical therapy.  I reviewed her external labs.  Lipids are at goal, normal renal function  and good BP control.  I will see her back on annual basis.  She does have mild anemia that is chronic, advised her to discuss with PCP regarding further evaluation if necessary.  Adrian Prows, MD, [facility] 01/17/2021, 12:02 PM Office: [phone]

## 2021-02-01 ENCOUNTER — Other Ambulatory Visit: Payer: Self-pay

## 2021-02-01 ENCOUNTER — Other Ambulatory Visit: Payer: Self-pay | Admitting: Physician Assistant

## 2021-02-01 ENCOUNTER — Ambulatory Visit: Payer: Medicare Other

## 2021-02-01 DIAGNOSIS — Z952 Presence of prosthetic heart valve: Secondary | ICD-10-CM | POA: Diagnosis not present

## 2021-02-01 DIAGNOSIS — I359 Nonrheumatic aortic valve disorder, unspecified: Secondary | ICD-10-CM

## 2021-02-02 NOTE — Telephone Encounter (Signed)
Pt's pharmacy is requesting a refill on pantoprazole. Would Dr. Angelena Form like to refill this medication? please address

## 2021-02-03 NOTE — Telephone Encounter (Signed)
The patient follows with Dr. Einar Gip for general cardiology needs.  Dr. Angelena Form has seen her for structural heart.

## 2021-02-19 ENCOUNTER — Emergency Department (HOSPITAL_COMMUNITY): Payer: Medicare Other

## 2021-02-19 ENCOUNTER — Emergency Department (HOSPITAL_COMMUNITY)
Admission: EM | Admit: 2021-02-19 | Discharge: 2021-02-19 | Disposition: A | Discharge status: Home / Self Care | Payer: Medicare Other | Attending: Emergency Medicine | Admitting: Emergency Medicine

## 2021-02-19 ENCOUNTER — Encounter (HOSPITAL_COMMUNITY): Payer: Self-pay

## 2021-02-19 DIAGNOSIS — Z7982 Long term (current) use of aspirin: Secondary | ICD-10-CM | POA: Insufficient documentation

## 2021-02-19 DIAGNOSIS — S6992XA Unspecified injury of left wrist, hand and finger(s), initial encounter: Secondary | ICD-10-CM | POA: Diagnosis present

## 2021-02-19 DIAGNOSIS — M189 Osteoarthritis of first carpometacarpal joint, unspecified: Secondary | ICD-10-CM | POA: Diagnosis not present

## 2021-02-19 DIAGNOSIS — S0003XA Contusion of scalp, initial encounter: Secondary | ICD-10-CM | POA: Diagnosis not present

## 2021-02-19 DIAGNOSIS — M25532 Pain in left wrist: Secondary | ICD-10-CM | POA: Diagnosis not present

## 2021-02-19 DIAGNOSIS — S0031XA Abrasion of nose, initial encounter: Secondary | ICD-10-CM | POA: Diagnosis not present

## 2021-02-19 DIAGNOSIS — Z853 Personal history of malignant neoplasm of breast: Secondary | ICD-10-CM | POA: Insufficient documentation

## 2021-02-19 DIAGNOSIS — Z23 Encounter for immunization: Secondary | ICD-10-CM | POA: Insufficient documentation

## 2021-02-19 DIAGNOSIS — I10 Essential (primary) hypertension: Secondary | ICD-10-CM | POA: Diagnosis not present

## 2021-02-19 DIAGNOSIS — W19XXXA Unspecified fall, initial encounter: Secondary | ICD-10-CM | POA: Diagnosis not present

## 2021-02-19 DIAGNOSIS — S0990XA Unspecified injury of head, initial encounter: Secondary | ICD-10-CM | POA: Diagnosis not present

## 2021-02-19 DIAGNOSIS — Y92480 Sidewalk as the place of occurrence of the external cause: Secondary | ICD-10-CM | POA: Diagnosis not present

## 2021-02-19 DIAGNOSIS — E039 Hypothyroidism, unspecified: Secondary | ICD-10-CM | POA: Insufficient documentation

## 2021-02-19 DIAGNOSIS — S63502A Unspecified sprain of left wrist, initial encounter: Secondary | ICD-10-CM | POA: Diagnosis not present

## 2021-02-19 DIAGNOSIS — M25512 Pain in left shoulder: Secondary | ICD-10-CM | POA: Diagnosis not present

## 2021-02-19 DIAGNOSIS — Z043 Encounter for examination and observation following other accident: Secondary | ICD-10-CM | POA: Diagnosis not present

## 2021-02-19 DIAGNOSIS — Z743 Need for continuous supervision: Secondary | ICD-10-CM | POA: Diagnosis not present

## 2021-02-19 DIAGNOSIS — W01198A Fall on same level from slipping, tripping and stumbling with subsequent striking against other object, initial encounter: Secondary | ICD-10-CM | POA: Insufficient documentation

## 2021-02-19 DIAGNOSIS — R609 Edema, unspecified: Secondary | ICD-10-CM | POA: Diagnosis not present

## 2021-02-19 DIAGNOSIS — M19012 Primary osteoarthritis, left shoulder: Secondary | ICD-10-CM | POA: Diagnosis not present

## 2021-02-19 DIAGNOSIS — S0083XA Contusion of other part of head, initial encounter: Secondary | ICD-10-CM | POA: Insufficient documentation

## 2021-02-19 DIAGNOSIS — Z96653 Presence of artificial knee joint, bilateral: Secondary | ICD-10-CM | POA: Diagnosis not present

## 2021-02-19 DIAGNOSIS — Z79899 Other long term (current) drug therapy: Secondary | ICD-10-CM | POA: Insufficient documentation

## 2021-02-19 DIAGNOSIS — M47812 Spondylosis without myelopathy or radiculopathy, cervical region: Secondary | ICD-10-CM | POA: Diagnosis not present

## 2021-02-19 DIAGNOSIS — M4312 Spondylolisthesis, cervical region: Secondary | ICD-10-CM | POA: Diagnosis not present

## 2021-02-19 MED ORDER — TETANUS-DIPHTH-ACELL PERTUSSIS 5-2.5-18.5 LF-MCG/0.5 IM SUSY
0.5000 mL | PREFILLED_SYRINGE | Freq: Once | INTRAMUSCULAR | Status: AC
  Administered 2021-02-19: 0.5 mL via INTRAMUSCULAR
  Filled 2021-02-19: qty 0.5

## 2021-02-19 NOTE — Discharge Instructions (Signed)
You were seen in the emergency department for evaluation of injuries from a fall.  You had a CAT scan of your head neck face along with x-rays of your left wrist and left shoulder that did not show any acute findings other than your forehead hematoma.  You should use ice to the painful areas and soap and water to your wounds.  Follow-up with your doctor.  Return to the emergency department if any worsening or concerning symptoms.

## 2021-02-19 NOTE — ED Notes (Signed)
Ambulated to BR and back to bed, RN walked with patient, denies dizziness/lightheadedness. States she does feel "off balance a little".

## 2021-02-19 NOTE — ED Notes (Signed)
Patient transported to CT 

## 2021-02-19 NOTE — ED Notes (Signed)
ED Provider at bedside. 

## 2021-02-19 NOTE — ED Notes (Signed)
Ice pack to forehead/nose.

## 2021-02-19 NOTE — Progress Notes (Signed)
Orthopedic Tech Progress Note Patient Details:  Zoe [name] February 12, 1934 [id number]  Ortho Devices Type of Ortho Device: Velcro wrist splint Ortho Device/Splint Location: LUE Ortho Device/Splint Interventions: Application,Ordered   Post Interventions Patient Tolerated: Well Instructions Provided: Adjustment of device,Poper ambulation with device   [name] A Rishith Siddoway 02/19/2021, 1:37 PM

## 2021-02-19 NOTE — ED Notes (Signed)
Ortho team at bedside

## 2021-02-19 NOTE — ED Provider Notes (Signed)
[facility] EMERGENCY DEPARTMENT Provider Note   CSN: [id number] Arrival date & time: 02/19/21  1015     History Chief Complaint  Patient presents with  . Fall    Zoe [name] is a 87 y.o. female.  She was walking from the car to the grocery store when she tripped going up a curb and hit her face on the concrete.  She also broke her fall with her hands and has some left wrist pain.  Left shoulder pain although may be more chronic.  EMS states that the hematoma on her forehead has grown since they initially picked her up.  Abrasions to her nose.  No headache, no malocclusion no double vision blurry vision.  No chest pain or shortness of breath.  No loss of consciousness  The history is provided by the patient and the EMS personnel.  Fall This is a new problem. The current episode started less than 1 hour ago. The problem has not changed since onset.Pertinent negatives include no chest pain, no abdominal pain, no headaches and no shortness of breath. Associated symptoms comments: Face pain, left wrist. Nothing aggravates the symptoms. Nothing relieves the symptoms. She has tried nothing for the symptoms. The treatment provided no relief.       Past Medical History:  Diagnosis Date  . Arthritis    some - per patient  . Breast cancer ([place])    breast cancer / left   . Cataract    bilat   . GERD (gastroesophageal reflux disease)   . History of kidney stones   . Hyperlipidemia   . Hypertension   . Hypothyroidism   . Macular degeneration    Left  . S/P TAVR (transcatheter aortic valve replacement) 09/03/2018   23 mm Edwards Sapien 3 transcatheter heart valve placed via percutaneous right transfemoral approach   . Severe aortic stenosis   . Stress incontinence   . Thyroid disease   . Tinnitus     Patient Active Problem List   Diagnosis Date Noted  . Dyspnea on exertion 12/17/2018  . S/P TAVR (transcatheter aortic valve replacement) 09/03/2018  .  Hypothyroidism   . Hypertension   . Hyperlipidemia   . GERD (gastroesophageal reflux disease)   . Severe aortic stenosis 08/05/2018  . S/P right TKA 06/18/2017  . Obese 03/15/2016  . S/P left TKA 03/13/2016  . History of breast cancer, DCIS, lumpectomy March 2000 12/13/2011    Past Surgical History:  Procedure Laterality Date  . ABDOMINAL HYSTERECTOMY  1970's  . BACK SURGERY    . BREAST LUMPECTOMY  12/1998   lumpectomy  . CARDIAC CATHETERIZATION    . EYE SURGERY     cataract surgery bilat   . INTRAOPERATIVE TRANSTHORACIC ECHOCARDIOGRAM N/A 09/03/2018   Procedure: INTRAOPERATIVE TRANSTHORACIC ECHOCARDIOGRAM;  Surgeon: Burnell Blanks, MD;  Location: [facility];  Service: Open Heart Surgery;  Laterality: N/A;  . LITHOTRIPSY    . Right total knee     2018 Dr. Alvan Dame  . RIGHT/LEFT HEART CATH AND CORONARY ANGIOGRAPHY N/A 08/06/2018   Procedure: RIGHT/LEFT HEART CATH AND CORONARY ANGIOGRAPHY;  Surgeon: Adrian Prows, MD;  Location: [facility];  Service: Cardiovascular;  Laterality: N/A;  . THYROIDECTOMY, PARTIAL  1975  . TONSILLECTOMY     as a child - patient not sure of exact date  . TOTAL KNEE ARTHROPLASTY Left 03/13/2016   Procedure: TOTAL KNEE ARTHROPLASTY;  Surgeon: Paralee Cancel, MD;  Location: [facility];  Service: Orthopedics;  Laterality: Left;  . TOTAL KNEE ARTHROPLASTY Right 06/18/2017   Procedure: RIGHT TOTAL KNEE ARTHROPLASTY;  Surgeon: Paralee Cancel, MD;  Location: [facility];  Service: Orthopedics;  Laterality: Right;  . TRANSCATHETER AORTIC VALVE REPLACEMENT, TRANSFEMORAL N/A 09/03/2018   Procedure: TRANSCATHETER AORTIC VALVE REPLACEMENT, TRANSFEMORAL;  Surgeon: Burnell Blanks, MD;  Location: [facility];  Service: Open Heart Surgery;  Laterality: N/A;     OB History   No obstetric history on file.     Family History  Problem Relation Age of Onset  . Diabetes Mother   . Stroke Mother        Carotid artery disease  . Heart disease Father        CAD  . Coronary  artery disease Father   . Diabetes Sister     Social History   Tobacco Use  . Smoking status: Never Smoker  . Smokeless tobacco: Never Used  Vaping Use  . Vaping Use: Never used  Substance Use Topics  . Alcohol use: No  . Drug use: No    Home Medications Prior to Admission medications   Medication Sig Start Date End Date Taking? Authorizing Provider  Artificial Tear Solution (SOOTHE XP) SOLN Place 1 drop into both eyes every evening.    [provider]  aspirin 81 MG chewable tablet Chew 1 tablet (81 mg total) by mouth daily. 09/05/18   Eileen Stanford, PA-C  carvedilol (COREG) 12.5 MG tablet Take 12.5 mg by mouth 2 (two) times daily with a meal.    [provider]  Cholecalciferol (VITAMIN D3) 2000 units TABS Take 2,000 Units by mouth daily.    [provider]  hydrochlorothiazide (MICROZIDE) 12.5 MG capsule Take 6.25 mg by mouth daily.    [provider]  levothyroxine (SYNTHROID, LEVOTHROID) 100 MCG tablet Take 100 mcg by mouth daily before breakfast. 12/29/15   [provider]  losartan (COZAAR) 25 MG tablet Take 1 tablet by mouth daily.    [provider]  Multiple Vitamins-Minerals (PRESERVISION AREDS 2) CAPS Take 1 capsule by mouth 2 (two) times daily.    [provider]  pantoprazole (PROTONIX) 40 MG tablet TAKE 1 TABLET BY MOUTH EVERY DAY 01/29/20   Eileen Stanford, PA-C  pravastatin (PRAVACHOL) 40 MG tablet Take 40 mg by mouth every evening.    [provider]  valsartan (DIOVAN) 320 MG tablet TAKE 1 TABLET BY MOUTH EVERY DAY 09/14/20   Adrian Prows, MD    Allergies    Penicillins and Sulfa antibiotics  Review of Systems   Review of Systems  Constitutional: Negative for fever.  HENT: Negative for sore throat.   Eyes: Negative for visual disturbance.  Respiratory: Negative for shortness of breath.   Cardiovascular: Negative for chest pain.  Gastrointestinal: Negative for abdominal pain.   Genitourinary: Negative for dysuria.  Musculoskeletal: Negative for neck pain.  Skin: Negative for rash.  Neurological: Negative for headaches.    Physical Exam Updated Vital Signs BP (!) 149/69 (BP Location: Right Arm)   Pulse 72   Temp 98.1 F (36.7 C) (Oral)   Resp 20   SpO2 98%   Physical Exam Vitals and nursing note reviewed.  Constitutional:      General: She is not in acute distress.    Appearance: Normal appearance. She is well-developed.  HENT:     Head: Normocephalic.     Comments: She is a hematoma to her left forehead and some abrasions over her nose. Eyes:  Extraocular Movements: Extraocular movements intact.     Conjunctiva/sclera: Conjunctivae normal.     Pupils: Pupils are equal, round, and reactive to light.  Cardiovascular:     Rate and Rhythm: Normal rate and regular rhythm.     Heart sounds: No murmur heard.   Pulmonary:     Effort: Pulmonary effort is normal. No respiratory distress.     Breath sounds: Normal breath sounds.  Abdominal:     Palpations: Abdomen is soft.     Tenderness: There is no abdominal tenderness. There is no guarding or rebound.  Musculoskeletal:        General: Tenderness present. No deformity. Normal range of motion.     Cervical back: Neck supple.     Comments: She is some tenderness over left wrist.  Elbow and shoulder full range of motion without any pain or limitations.  Full range of motion of her right upper extremity and bilateral lower extremities without any pain or limitations.  Skin:    General: Skin is warm and dry.     Capillary Refill: Capillary refill takes less than 2 seconds.  Neurological:     General: No focal deficit present.     Mental Status: She is alert and oriented to person, place, and time.     Cranial Nerves: No cranial nerve deficit.     Sensory: No sensory deficit.     Motor: No weakness.     ED Results / Procedures / Treatments   Labs (all labs ordered are listed, but only abnormal  results are displayed) Labs Reviewed - No data to display  EKG None  Radiology DG Wrist Complete Left  Result Date: 02/19/2021 CLINICAL DATA:  Status post fall, pain EXAM: LEFT WRIST - COMPLETE 3+ VIEW COMPARISON:  None. FINDINGS: Generalized osteopenia. No acute fracture or dislocation. No aggressive osseous lesion. Severe osteoarthritis of the first [facility] joint. Moderate osteoarthritis of the scaphotrapeziotrapezoid joint. Chondrocalcinosis of the TFCC as can be seen with CPPD. No focal soft tissue abnormality. IMPRESSION: No acute osseous injury of the left wrist. Electronically Signed   By: Kathreen Devoid   On: 02/19/2021 12:24   CT Head Wo Contrast  Result Date: 02/19/2021 CLINICAL DATA:  Fall, hit head EXAM: CT HEAD WITHOUT CONTRAST CT MAXILLOFACIAL WITHOUT CONTRAST TECHNIQUE: Multidetector CT imaging of the head and maxillofacial structures were performed using the standard protocol without intravenous contrast. Multiplanar CT image reconstructions of the maxillofacial structures were also generated. COMPARISON:  None. FINDINGS: CT HEAD FINDINGS Brain: There is no acute intracranial hemorrhage, mass effect, or edema. Gray-white differentiation is preserved. Minimal patchy hypoattenuation in the supratentorial white matter is nonspecific but may reflect minor chronic microvascular ischemic changes. Ventricles and sulci are within normal limits in size and configuration. There is no extra-axial collection. Vascular: There is intracranial atherosclerotic calcification at the skull base. Skull: Calvarium is intact. Other: Left anterior frontal scalp hematoma. CT MAXILLOFACIAL FINDINGS Osseous: No acute facial fracture. Orbits: Bilateral lens replacements.  No intraorbital hematoma. Sinuses: Evidence of prior sinonasal surgery with patchy polypoid mucosal thickening. Soft tissues: Left anterior frontal scalp hematoma extending to the frontonasal region. IMPRESSION: No evidence of acute intracranial  injury.  No acute facial fracture. Electronically Signed   By: Macy Mis M.D.   On: 02/19/2021 11:40   CT Cervical Spine Wo Contrast  Result Date: 02/19/2021 CLINICAL DATA:  Fall, head injury EXAM: CT CERVICAL SPINE WITHOUT CONTRAST TECHNIQUE: Multidetector CT imaging of the cervical spine was performed without intravenous  contrast. Multiplanar CT image reconstructions were also generated. COMPARISON:  None. FINDINGS: Alignment: Trace anterolisthesis at C5-C6. Skull base and vertebrae: Vertebral body heights are maintained. Decreased osseous mineralization. No acute fracture. Soft tissues and spinal canal: No prevertebral fluid or swelling. No visible canal hematoma. Disc levels: Multilevel degenerative changes are present including disc space narrowing, endplate osteophytes, and facet and uncovertebral hypertrophy. Right facet joint fusion at C7-T1 and left facet fusion at C3-C5 and C7-T1. There is no high-grade osseous encroachment on the spinal canal. There is multilevel foraminal narrowing. Upper chest: Negative. Other: Calcified plaque at the left greater than right common carotid bifurcations. IMPRESSION: No acute cervical spine fracture. Electronically Signed   By: Macy Mis M.D.   On: 02/19/2021 11:45   DG Shoulder Left  Result Date: 02/19/2021 CLINICAL DATA:  Status post fall. EXAM: LEFT SHOULDER - 2+ VIEW COMPARISON:  None. FINDINGS: No fracture or dislocation. Generalized osteopenia. Mild osteoarthritis of the glenohumeral joint. Mild degenerative changes of the acromioclavicular joint. Soft tissues are normal. IMPRESSION: No acute osseous injury of the left shoulder. Electronically Signed   By: Kathreen Devoid   On: 02/19/2021 12:22   CT Maxillofacial WO CM  Result Date: 02/19/2021 CLINICAL DATA:  Fall, hit head EXAM: CT HEAD WITHOUT CONTRAST CT MAXILLOFACIAL WITHOUT CONTRAST TECHNIQUE: Multidetector CT imaging of the head and maxillofacial structures were performed using the standard  protocol without intravenous contrast. Multiplanar CT image reconstructions of the maxillofacial structures were also generated. COMPARISON:  None. FINDINGS: CT HEAD FINDINGS Brain: There is no acute intracranial hemorrhage, mass effect, or edema. Gray-white differentiation is preserved. Minimal patchy hypoattenuation in the supratentorial white matter is nonspecific but may reflect minor chronic microvascular ischemic changes. Ventricles and sulci are within normal limits in size and configuration. There is no extra-axial collection. Vascular: There is intracranial atherosclerotic calcification at the skull base. Skull: Calvarium is intact. Other: Left anterior frontal scalp hematoma. CT MAXILLOFACIAL FINDINGS Osseous: No acute facial fracture. Orbits: Bilateral lens replacements.  No intraorbital hematoma. Sinuses: Evidence of prior sinonasal surgery with patchy polypoid mucosal thickening. Soft tissues: Left anterior frontal scalp hematoma extending to the frontonasal region. IMPRESSION: No evidence of acute intracranial injury.  No acute facial fracture. Electronically Signed   By: Macy Mis M.D.   On: 02/19/2021 11:40    Procedures Procedures   Medications Ordered in ED Medications  Tdap (BOOSTRIX) injection 0.5 mL (has no administration in time range)    ED Course  I have reviewed the triage vital signs and the nursing notes.  Pertinent labs & imaging results that were available during my care of the patient were reviewed by me and considered in my medical decision making (see chart for details).  Clinical Course as of 02/19/21 1727  Sat Feb 19, 2021  1143 X-rays of left wrist show significant degenerative changes at base of first metacarpal.  Left shoulder also showing significant degenerative changes. [MB]    Clinical Course User Index [MB] Hayden Rasmussen, MD   MDM Rules/Calculators/A&P                          87 year old female not on anticoagulation here with significant  head injury striking her head and having a large hematoma after fall.  Also with left wrist and shoulder pain.  Differential includes skull fracture, facial fractures, intracranial bleed, wrist fracture, dislocation, shoulder fracture and dislocation.  X-rays and CTs ordered and interpreted by me as no acute  findings.  Patient is ambulated in the department without any significant difficulty.  Her left wrist was placed in a splint.  Return instructions discussed Final Clinical Impression(s) / ED Diagnoses Final diagnoses:  Contusion of face, initial encounter  Abrasion of nose, initial encounter  Sprain of left wrist, initial encounter  Fall, initial encounter    Rx / DC Orders ED Discharge Orders    None       Hayden Rasmussen, MD 02/19/21 1729

## 2021-02-19 NOTE — ED Triage Notes (Signed)
Fall, no blood thinners, face, hematoma center forehead, swelling to nose with bleeding controlled, also c/o left shoulder and left wrist pain. No LOC. BP 128/76, HR 74, RR 20, 100% RA, 97.1 temp.

## 2021-02-22 DIAGNOSIS — S0512XA Contusion of eyeball and orbital tissues, left eye, initial encounter: Secondary | ICD-10-CM | POA: Diagnosis not present

## 2021-02-22 DIAGNOSIS — H26492 Other secondary cataract, left eye: Secondary | ICD-10-CM | POA: Diagnosis not present

## 2021-02-22 DIAGNOSIS — H04123 Dry eye syndrome of bilateral lacrimal glands: Secondary | ICD-10-CM | POA: Diagnosis not present

## 2021-02-22 DIAGNOSIS — H35372 Puckering of macula, left eye: Secondary | ICD-10-CM | POA: Diagnosis not present

## 2021-02-22 DIAGNOSIS — H1045 Other chronic allergic conjunctivitis: Secondary | ICD-10-CM | POA: Diagnosis not present

## 2021-02-22 DIAGNOSIS — Z961 Presence of intraocular lens: Secondary | ICD-10-CM | POA: Diagnosis not present

## 2021-02-22 DIAGNOSIS — H353132 Nonexudative age-related macular degeneration, bilateral, intermediate dry stage: Secondary | ICD-10-CM | POA: Diagnosis not present

## 2021-02-25 DIAGNOSIS — M79602 Pain in left arm: Secondary | ICD-10-CM | POA: Diagnosis not present

## 2021-02-25 DIAGNOSIS — S0093XA Contusion of unspecified part of head, initial encounter: Secondary | ICD-10-CM | POA: Diagnosis not present

## 2021-02-25 DIAGNOSIS — I10 Essential (primary) hypertension: Secondary | ICD-10-CM | POA: Diagnosis not present

## 2021-02-25 DIAGNOSIS — R7303 Prediabetes: Secondary | ICD-10-CM | POA: Diagnosis not present

## 2021-02-25 DIAGNOSIS — M25532 Pain in left wrist: Secondary | ICD-10-CM | POA: Diagnosis not present

## 2021-02-25 DIAGNOSIS — J3489 Other specified disorders of nose and nasal sinuses: Secondary | ICD-10-CM | POA: Diagnosis not present

## 2021-03-31 ENCOUNTER — Ambulatory Visit: Payer: Medicare Other | Attending: Internal Medicine

## 2021-03-31 ENCOUNTER — Other Ambulatory Visit (HOSPITAL_BASED_OUTPATIENT_CLINIC_OR_DEPARTMENT_OTHER): Payer: Self-pay

## 2021-03-31 ENCOUNTER — Other Ambulatory Visit: Payer: Self-pay

## 2021-03-31 DIAGNOSIS — Z23 Encounter for immunization: Secondary | ICD-10-CM

## 2021-03-31 MED ORDER — PFIZER-BIONT COVID-19 VAC-TRIS 30 MCG/0.3ML IM SUSP
INTRAMUSCULAR | 0 refills | Status: DC
  Filled 2021-03-31: qty 0.3, 1d supply, fill #0

## 2021-03-31 NOTE — Progress Notes (Signed)
   Covid-19 Vaccination Clinic  Name:  Zoe [name]    MRN: [id number] DOB: Nov 05, 1933  03/31/2021  [name] was observed post Covid-19 immunization for 15 minutes without incident. She was provided with Vaccine Information Sheet and instruction to access the V-Safe system.   [name] was instructed to call 911 with any severe reactions post vaccine: Difficulty breathing  Swelling of face and throat  A fast heartbeat  A bad rash all over body  Dizziness and weakness   Immunizations Administered     Name Date Dose VIS Date Route   PFIZER Comrnaty(Gray TOP) Covid-19 Vaccine 03/31/2021 11:14 AM 0.3 mL 09/30/2020 Intramuscular   Manufacturer: [employer]   Lot: [id number]   [place]: [phone]

## 2021-04-11 DIAGNOSIS — I10 Essential (primary) hypertension: Secondary | ICD-10-CM | POA: Diagnosis not present

## 2021-04-11 DIAGNOSIS — R7303 Prediabetes: Secondary | ICD-10-CM | POA: Diagnosis not present

## 2021-04-12 ENCOUNTER — Encounter (INDEPENDENT_AMBULATORY_CARE_PROVIDER_SITE_OTHER): Payer: Medicare Other | Admitting: Ophthalmology

## 2021-04-12 ENCOUNTER — Other Ambulatory Visit: Payer: Self-pay

## 2021-04-12 DIAGNOSIS — I10 Essential (primary) hypertension: Secondary | ICD-10-CM

## 2021-04-12 DIAGNOSIS — H35372 Puckering of macula, left eye: Secondary | ICD-10-CM

## 2021-04-12 DIAGNOSIS — H43813 Vitreous degeneration, bilateral: Secondary | ICD-10-CM

## 2021-04-12 DIAGNOSIS — H353211 Exudative age-related macular degeneration, right eye, with active choroidal neovascularization: Secondary | ICD-10-CM | POA: Diagnosis not present

## 2021-04-12 DIAGNOSIS — H353122 Nonexudative age-related macular degeneration, left eye, intermediate dry stage: Secondary | ICD-10-CM | POA: Diagnosis not present

## 2021-04-12 DIAGNOSIS — H35033 Hypertensive retinopathy, bilateral: Secondary | ICD-10-CM

## 2021-04-18 DIAGNOSIS — R2689 Other abnormalities of gait and mobility: Secondary | ICD-10-CM | POA: Diagnosis not present

## 2021-04-18 DIAGNOSIS — E559 Vitamin D deficiency, unspecified: Secondary | ICD-10-CM | POA: Diagnosis not present

## 2021-04-18 DIAGNOSIS — Z952 Presence of prosthetic heart valve: Secondary | ICD-10-CM | POA: Diagnosis not present

## 2021-04-18 DIAGNOSIS — E785 Hyperlipidemia, unspecified: Secondary | ICD-10-CM | POA: Diagnosis not present

## 2021-04-18 DIAGNOSIS — Z862 Personal history of diseases of the blood and blood-forming organs and certain disorders involving the immune mechanism: Secondary | ICD-10-CM | POA: Diagnosis not present

## 2021-04-18 DIAGNOSIS — R7309 Other abnormal glucose: Secondary | ICD-10-CM | POA: Diagnosis not present

## 2021-04-18 DIAGNOSIS — E039 Hypothyroidism, unspecified: Secondary | ICD-10-CM | POA: Diagnosis not present

## 2021-04-18 DIAGNOSIS — I10 Essential (primary) hypertension: Secondary | ICD-10-CM | POA: Diagnosis not present

## 2021-04-18 DIAGNOSIS — I6521 Occlusion and stenosis of right carotid artery: Secondary | ICD-10-CM | POA: Diagnosis not present

## 2021-04-18 DIAGNOSIS — M12812 Other specific arthropathies, not elsewhere classified, left shoulder: Secondary | ICD-10-CM | POA: Diagnosis not present

## 2021-05-10 ENCOUNTER — Encounter (INDEPENDENT_AMBULATORY_CARE_PROVIDER_SITE_OTHER): Payer: Medicare Other | Admitting: Ophthalmology

## 2021-05-13 ENCOUNTER — Other Ambulatory Visit: Payer: Self-pay

## 2021-05-13 ENCOUNTER — Encounter (INDEPENDENT_AMBULATORY_CARE_PROVIDER_SITE_OTHER): Payer: Medicare Other | Admitting: Ophthalmology

## 2021-05-13 DIAGNOSIS — H35372 Puckering of macula, left eye: Secondary | ICD-10-CM | POA: Diagnosis not present

## 2021-05-13 DIAGNOSIS — H43813 Vitreous degeneration, bilateral: Secondary | ICD-10-CM

## 2021-05-13 DIAGNOSIS — H35033 Hypertensive retinopathy, bilateral: Secondary | ICD-10-CM | POA: Diagnosis not present

## 2021-05-13 DIAGNOSIS — H353211 Exudative age-related macular degeneration, right eye, with active choroidal neovascularization: Secondary | ICD-10-CM

## 2021-05-13 DIAGNOSIS — H3554 Dystrophies primarily involving the retinal pigment epithelium: Secondary | ICD-10-CM

## 2021-05-13 DIAGNOSIS — H353122 Nonexudative age-related macular degeneration, left eye, intermediate dry stage: Secondary | ICD-10-CM | POA: Diagnosis not present

## 2021-05-13 DIAGNOSIS — I10 Essential (primary) hypertension: Secondary | ICD-10-CM

## 2021-05-19 DIAGNOSIS — M7542 Impingement syndrome of left shoulder: Secondary | ICD-10-CM | POA: Diagnosis not present

## 2021-05-19 DIAGNOSIS — M1812 Unilateral primary osteoarthritis of first carpometacarpal joint, left hand: Secondary | ICD-10-CM | POA: Diagnosis not present

## 2021-06-10 ENCOUNTER — Other Ambulatory Visit: Payer: Self-pay

## 2021-06-10 ENCOUNTER — Encounter (INDEPENDENT_AMBULATORY_CARE_PROVIDER_SITE_OTHER): Payer: Medicare Other | Admitting: Ophthalmology

## 2021-06-10 DIAGNOSIS — H43813 Vitreous degeneration, bilateral: Secondary | ICD-10-CM

## 2021-06-10 DIAGNOSIS — H35033 Hypertensive retinopathy, bilateral: Secondary | ICD-10-CM | POA: Diagnosis not present

## 2021-06-10 DIAGNOSIS — H353122 Nonexudative age-related macular degeneration, left eye, intermediate dry stage: Secondary | ICD-10-CM | POA: Diagnosis not present

## 2021-06-10 DIAGNOSIS — H35372 Puckering of macula, left eye: Secondary | ICD-10-CM | POA: Diagnosis not present

## 2021-06-10 DIAGNOSIS — H353211 Exudative age-related macular degeneration, right eye, with active choroidal neovascularization: Secondary | ICD-10-CM | POA: Diagnosis not present

## 2021-06-10 DIAGNOSIS — I10 Essential (primary) hypertension: Secondary | ICD-10-CM

## 2021-07-07 ENCOUNTER — Other Ambulatory Visit: Payer: Medicare Other

## 2021-07-15 ENCOUNTER — Encounter (INDEPENDENT_AMBULATORY_CARE_PROVIDER_SITE_OTHER): Payer: Medicare Other | Admitting: Ophthalmology

## 2021-07-15 ENCOUNTER — Other Ambulatory Visit: Payer: Self-pay

## 2021-07-15 DIAGNOSIS — H35033 Hypertensive retinopathy, bilateral: Secondary | ICD-10-CM

## 2021-07-15 DIAGNOSIS — H353122 Nonexudative age-related macular degeneration, left eye, intermediate dry stage: Secondary | ICD-10-CM | POA: Diagnosis not present

## 2021-07-15 DIAGNOSIS — I10 Essential (primary) hypertension: Secondary | ICD-10-CM

## 2021-07-15 DIAGNOSIS — H353211 Exudative age-related macular degeneration, right eye, with active choroidal neovascularization: Secondary | ICD-10-CM

## 2021-07-15 DIAGNOSIS — H43813 Vitreous degeneration, bilateral: Secondary | ICD-10-CM | POA: Diagnosis not present

## 2021-07-25 ENCOUNTER — Other Ambulatory Visit (HOSPITAL_BASED_OUTPATIENT_CLINIC_OR_DEPARTMENT_OTHER): Payer: Self-pay

## 2021-07-25 ENCOUNTER — Ambulatory Visit: Payer: Medicare Other | Attending: Internal Medicine

## 2021-07-25 DIAGNOSIS — Z23 Encounter for immunization: Secondary | ICD-10-CM

## 2021-07-25 MED ORDER — PFIZER COVID-19 VAC BIVALENT 30 MCG/0.3ML IM SUSP
INTRAMUSCULAR | 0 refills | Status: DC
  Filled 2021-07-25: qty 0.3, 1d supply, fill #0

## 2021-07-25 MED ORDER — INFLUENZA VAC A&B SA ADJ QUAD 0.5 ML IM PRSY
PREFILLED_SYRINGE | INTRAMUSCULAR | 0 refills | Status: DC
  Filled 2021-07-25: qty 0.5, 1d supply, fill #0

## 2021-07-25 NOTE — Progress Notes (Signed)
   Covid-19 Vaccination Clinic  Name:  ALANTE WEIMANN    MRN: [id number] DOB: Mar 14, 1934  07/25/2021  Ms. Hayden was observed post Covid-19 immunization for 15 minutes without incident. She was provided with Vaccine Information Sheet and instruction to access the V-Safe system.   Ms. Kopke was instructed to call 911 with any severe reactions post vaccine: Difficulty breathing  Swelling of face and throat  A fast heartbeat  A bad rash all over body  Dizziness and weakness

## 2021-07-27 DIAGNOSIS — R52 Pain, unspecified: Secondary | ICD-10-CM | POA: Diagnosis not present

## 2021-07-27 DIAGNOSIS — M1812 Unilateral primary osteoarthritis of first carpometacarpal joint, left hand: Secondary | ICD-10-CM | POA: Diagnosis not present

## 2021-07-27 DIAGNOSIS — M13849 Other specified arthritis, unspecified hand: Secondary | ICD-10-CM | POA: Diagnosis not present

## 2021-07-27 DIAGNOSIS — M79645 Pain in left finger(s): Secondary | ICD-10-CM | POA: Diagnosis not present

## 2021-08-22 ENCOUNTER — Other Ambulatory Visit: Payer: Self-pay

## 2021-08-22 ENCOUNTER — Encounter (INDEPENDENT_AMBULATORY_CARE_PROVIDER_SITE_OTHER): Payer: Medicare Other | Admitting: Ophthalmology

## 2021-08-22 DIAGNOSIS — I10 Essential (primary) hypertension: Secondary | ICD-10-CM

## 2021-08-22 DIAGNOSIS — H35033 Hypertensive retinopathy, bilateral: Secondary | ICD-10-CM | POA: Diagnosis not present

## 2021-08-22 DIAGNOSIS — H353122 Nonexudative age-related macular degeneration, left eye, intermediate dry stage: Secondary | ICD-10-CM

## 2021-08-22 DIAGNOSIS — H43813 Vitreous degeneration, bilateral: Secondary | ICD-10-CM

## 2021-08-22 DIAGNOSIS — H353211 Exudative age-related macular degeneration, right eye, with active choroidal neovascularization: Secondary | ICD-10-CM | POA: Diagnosis not present

## 2021-09-02 ENCOUNTER — Other Ambulatory Visit: Payer: Self-pay | Admitting: Cardiology

## 2021-09-02 DIAGNOSIS — I10 Essential (primary) hypertension: Secondary | ICD-10-CM

## 2021-09-26 ENCOUNTER — Other Ambulatory Visit: Payer: Self-pay

## 2021-09-26 ENCOUNTER — Encounter (INDEPENDENT_AMBULATORY_CARE_PROVIDER_SITE_OTHER): Payer: Medicare Other | Admitting: Ophthalmology

## 2021-09-26 DIAGNOSIS — I10 Essential (primary) hypertension: Secondary | ICD-10-CM

## 2021-09-26 DIAGNOSIS — H43813 Vitreous degeneration, bilateral: Secondary | ICD-10-CM

## 2021-09-26 DIAGNOSIS — H353211 Exudative age-related macular degeneration, right eye, with active choroidal neovascularization: Secondary | ICD-10-CM | POA: Diagnosis not present

## 2021-09-26 DIAGNOSIS — H353122 Nonexudative age-related macular degeneration, left eye, intermediate dry stage: Secondary | ICD-10-CM | POA: Diagnosis not present

## 2021-09-26 DIAGNOSIS — H35371 Puckering of macula, right eye: Secondary | ICD-10-CM | POA: Diagnosis not present

## 2021-09-26 DIAGNOSIS — H35033 Hypertensive retinopathy, bilateral: Secondary | ICD-10-CM

## 2021-09-27 DIAGNOSIS — E559 Vitamin D deficiency, unspecified: Secondary | ICD-10-CM | POA: Diagnosis not present

## 2021-09-27 DIAGNOSIS — E785 Hyperlipidemia, unspecified: Secondary | ICD-10-CM | POA: Diagnosis not present

## 2021-09-27 DIAGNOSIS — Z862 Personal history of diseases of the blood and blood-forming organs and certain disorders involving the immune mechanism: Secondary | ICD-10-CM | POA: Diagnosis not present

## 2021-09-27 DIAGNOSIS — I10 Essential (primary) hypertension: Secondary | ICD-10-CM | POA: Diagnosis not present

## 2021-09-27 DIAGNOSIS — R7309 Other abnormal glucose: Secondary | ICD-10-CM | POA: Diagnosis not present

## 2021-09-27 DIAGNOSIS — E039 Hypothyroidism, unspecified: Secondary | ICD-10-CM | POA: Diagnosis not present

## 2021-09-28 DIAGNOSIS — I10 Essential (primary) hypertension: Secondary | ICD-10-CM | POA: Diagnosis not present

## 2021-09-28 DIAGNOSIS — Z862 Personal history of diseases of the blood and blood-forming organs and certain disorders involving the immune mechanism: Secondary | ICD-10-CM | POA: Diagnosis not present

## 2021-10-04 DIAGNOSIS — R7309 Other abnormal glucose: Secondary | ICD-10-CM | POA: Diagnosis not present

## 2021-10-04 DIAGNOSIS — Z862 Personal history of diseases of the blood and blood-forming organs and certain disorders involving the immune mechanism: Secondary | ICD-10-CM | POA: Diagnosis not present

## 2021-10-04 DIAGNOSIS — N390 Urinary tract infection, site not specified: Secondary | ICD-10-CM | POA: Diagnosis not present

## 2021-10-04 DIAGNOSIS — R0602 Shortness of breath: Secondary | ICD-10-CM | POA: Diagnosis not present

## 2021-10-04 DIAGNOSIS — E785 Hyperlipidemia, unspecified: Secondary | ICD-10-CM | POA: Diagnosis not present

## 2021-10-04 DIAGNOSIS — M8589 Other specified disorders of bone density and structure, multiple sites: Secondary | ICD-10-CM | POA: Diagnosis not present

## 2021-10-04 DIAGNOSIS — Z8781 Personal history of (healed) traumatic fracture: Secondary | ICD-10-CM | POA: Diagnosis not present

## 2021-10-04 DIAGNOSIS — I10 Essential (primary) hypertension: Secondary | ICD-10-CM | POA: Diagnosis not present

## 2021-10-04 DIAGNOSIS — Z0000 Encounter for general adult medical examination without abnormal findings: Secondary | ICD-10-CM | POA: Diagnosis not present

## 2021-10-04 DIAGNOSIS — M8580 Other specified disorders of bone density and structure, unspecified site: Secondary | ICD-10-CM | POA: Diagnosis not present

## 2021-10-04 DIAGNOSIS — E559 Vitamin D deficiency, unspecified: Secondary | ICD-10-CM | POA: Diagnosis not present

## 2021-10-04 DIAGNOSIS — E039 Hypothyroidism, unspecified: Secondary | ICD-10-CM | POA: Diagnosis not present

## 2021-10-31 ENCOUNTER — Encounter (INDEPENDENT_AMBULATORY_CARE_PROVIDER_SITE_OTHER): Payer: Medicare Other | Admitting: Ophthalmology

## 2021-10-31 ENCOUNTER — Other Ambulatory Visit: Payer: Self-pay

## 2021-10-31 DIAGNOSIS — H35372 Puckering of macula, left eye: Secondary | ICD-10-CM | POA: Diagnosis not present

## 2021-10-31 DIAGNOSIS — H35033 Hypertensive retinopathy, bilateral: Secondary | ICD-10-CM

## 2021-10-31 DIAGNOSIS — I10 Essential (primary) hypertension: Secondary | ICD-10-CM | POA: Diagnosis not present

## 2021-10-31 DIAGNOSIS — H353231 Exudative age-related macular degeneration, bilateral, with active choroidal neovascularization: Secondary | ICD-10-CM | POA: Diagnosis not present

## 2021-10-31 DIAGNOSIS — H43813 Vitreous degeneration, bilateral: Secondary | ICD-10-CM

## 2021-12-05 ENCOUNTER — Encounter (INDEPENDENT_AMBULATORY_CARE_PROVIDER_SITE_OTHER): Payer: Medicare Other | Admitting: Ophthalmology

## 2021-12-05 ENCOUNTER — Other Ambulatory Visit: Payer: Self-pay

## 2021-12-05 DIAGNOSIS — H35033 Hypertensive retinopathy, bilateral: Secondary | ICD-10-CM

## 2021-12-05 DIAGNOSIS — H353231 Exudative age-related macular degeneration, bilateral, with active choroidal neovascularization: Secondary | ICD-10-CM

## 2021-12-05 DIAGNOSIS — H43813 Vitreous degeneration, bilateral: Secondary | ICD-10-CM

## 2021-12-05 DIAGNOSIS — H35372 Puckering of macula, left eye: Secondary | ICD-10-CM | POA: Diagnosis not present

## 2021-12-05 DIAGNOSIS — I10 Essential (primary) hypertension: Secondary | ICD-10-CM

## 2022-01-09 ENCOUNTER — Other Ambulatory Visit: Payer: Self-pay

## 2022-01-09 ENCOUNTER — Encounter (INDEPENDENT_AMBULATORY_CARE_PROVIDER_SITE_OTHER): Payer: Medicare Other | Admitting: Ophthalmology

## 2022-01-09 DIAGNOSIS — H35033 Hypertensive retinopathy, bilateral: Secondary | ICD-10-CM | POA: Diagnosis not present

## 2022-01-09 DIAGNOSIS — I10 Essential (primary) hypertension: Secondary | ICD-10-CM | POA: Diagnosis not present

## 2022-01-09 DIAGNOSIS — H43813 Vitreous degeneration, bilateral: Secondary | ICD-10-CM

## 2022-01-09 DIAGNOSIS — H35372 Puckering of macula, left eye: Secondary | ICD-10-CM | POA: Diagnosis not present

## 2022-01-09 DIAGNOSIS — H353231 Exudative age-related macular degeneration, bilateral, with active choroidal neovascularization: Secondary | ICD-10-CM | POA: Diagnosis not present

## 2022-01-18 ENCOUNTER — Ambulatory Visit: Payer: Medicare Other | Admitting: Cardiology

## 2022-01-24 ENCOUNTER — Encounter: Payer: Self-pay | Admitting: Cardiology

## 2022-01-24 ENCOUNTER — Ambulatory Visit: Payer: Medicare Other | Admitting: Cardiology

## 2022-01-24 VITALS — BP 130/75 | HR 86 | Temp 98.3°F | Resp 16 | Ht 63.0 in | Wt 179.0 lb

## 2022-01-24 DIAGNOSIS — I6523 Occlusion and stenosis of bilateral carotid arteries: Secondary | ICD-10-CM

## 2022-01-24 DIAGNOSIS — I10 Essential (primary) hypertension: Secondary | ICD-10-CM

## 2022-01-24 DIAGNOSIS — E7800 Pure hypercholesterolemia, unspecified: Secondary | ICD-10-CM

## 2022-01-24 DIAGNOSIS — Z952 Presence of prosthetic heart valve: Secondary | ICD-10-CM

## 2022-01-24 NOTE — Progress Notes (Signed)
? ?Primary Physician:  Janie Morning, DO ? ? ?Patient ID: [name], female    DOB: 04-29-1934, 88 y.o.   MRN: [id number] ? ?Subjective:  ? ? ?Chief Complaint  ?Patient presents with  ? AV Disorder  ? Carotis Stenosis  ? Follow-up  ?  1 year  ? ? ?HPI: [name]  is a 88 y.o. female  with history of possibly mild coronary spasm by coronary angiogram in 2014 when she presented with angina pectoris, otherwise no significant CAD, severe AS s/p TAVR by Dr. Roxy Manns on 11/12., asymptomatic carotid artery stenosis,  essential hypertension, hyperlipidemia, and GERD.  ? ?She is presently doing well except for mild chronic dyspnea, she has stopped exercising again for no particular reason but states that since her husband has COPD, they are worried about COVID.  No PND or orthopnea.  Denies TIA, denies headache, visual disturbances.  ? ?Past Medical History:  ?Diagnosis Date  ? Arthritis   ? some - per patient  ? Breast cancer ([place])   ? breast cancer / left   ? Cataract   ? bilat   ? GERD (gastroesophageal reflux disease)   ? History of kidney stones   ? Hyperlipidemia   ? Hypertension   ? Hypothyroidism   ? Macular degeneration   ? Left  ? S/P TAVR (transcatheter aortic valve replacement) 09/03/2018  ? 23 mm Edwards Sapien 3 transcatheter heart valve placed via percutaneous right transfemoral approach   ? Severe aortic stenosis   ? Stress incontinence   ? Thyroid disease   ? Tinnitus   ? ? ?Social History  ? ?Tobacco Use  ? Smoking status: Never  ? Smokeless tobacco: Never  ?Substance Use Topics  ? Alcohol use: No  ?  ? ?Review of Systems  ?Cardiovascular:  Positive for dyspnea on exertion. Negative for chest pain and leg swelling.  ?Objective:  ?Blood pressure 130/75, pulse 86, temperature 98.3 ?F (36.8 ?C), temperature source Temporal, resp. rate 16, height '5\' 3"'  (1.6 m), weight 179 lb (81.2 kg), SpO2 96 %. Body mass index is 31.71 kg/m?.  ? ?  01/24/2022  ?  2:51 PM 01/24/2022  ?  2:18 PM 02/19/2021  ?  1:30 PM   ?Vitals with BMI  ?Height  '5\' 3"'    ?Weight  179 lbs   ?BMI  31.72   ?Systolic 570 177 939  ?Diastolic 75 65 69  ?Pulse  86 72  ?  ?  ?Physical Exam ?Vitals reviewed.  ?Cardiovascular:  ?   Rate and Rhythm: Normal rate and regular rhythm.  ?   Pulses: Normal pulses and intact distal pulses.     ?     Carotid pulses are  on the right side with bruit and  on the left side with bruit. ?   Heart sounds: Murmur heard.  ?Early systolic murmur is present with a grade of 1/6 at the upper right sternal border.  ?Pulmonary:  ?   Effort: Pulmonary effort is normal. No accessory muscle usage or respiratory distress.  ?   Breath sounds: Normal breath sounds.  ?Abdominal:  ?   General: Bowel sounds are normal.  ?   Palpations: Abdomen is soft.  ?Musculoskeletal:  ?   Right lower leg: No edema.  ?Skin: ?   Capillary Refill: Capillary refill takes less than 2 seconds.  ? ?Radiology: ?No results found. ? ?Laboratory examination:  ? ? ?External labs ? ?Labs 10/04/2021: ? ?A1c 5.7%.  TSH normal at  1.18.  Vitamin D38.6. ? ?Hb 10.3/HCT 32.3, platelets 232. ? ?BUN 23, creatinine 0.87, EGFR 60 mill, potassium 4.1. ? ?Total cholesterol 113, triglycerides 100, HDL 43, LDL 50. ? ? ?Current Outpatient Medications:  ?  Artificial Tear Solution (SOOTHE XP) SOLN, Place 1 drop into both eyes every evening., Disp: , Rfl:  ?  aspirin 81 MG chewable tablet, Chew 1 tablet (81 mg total) by mouth daily., Disp: , Rfl:  ?  carvedilol (COREG) 12.5 MG tablet, Take 12.5 mg by mouth 2 (two) times daily with a meal., Disp: , Rfl:  ?  Cholecalciferol (VITAMIN D3) 2000 units TABS, Take 2,000 Units by mouth daily., Disp: , Rfl:  ?  esomeprazole (NEXIUM) 20 MG capsule, Take 20 mg by mouth daily., Disp: , Rfl:  ?  hydrochlorothiazide (MICROZIDE) 12.5 MG capsule, Take 6.25 mg by mouth daily., Disp: , Rfl:  ?  levothyroxine (SYNTHROID, LEVOTHROID) 100 MCG tablet, Take 100 mcg by mouth daily before breakfast., Disp: , Rfl: 2 ?  losartan (COZAAR) 25 MG tablet, Take 1  tablet by mouth daily., Disp: , Rfl:  ?  Multiple Vitamins-Minerals (PRESERVISION AREDS 2) CAPS, Take 1 capsule by mouth 2 (two) times daily., Disp: , Rfl:  ?  pravastatin (PRAVACHOL) 40 MG tablet, Take 40 mg by mouth every evening., Disp: , Rfl:   ?  ? ?Cardiac Studies:  ?  ?Coronary angiogram 08-Aug-2018: normal coronary arteries. Moderate pulmonary hypertension due to AS with elevated PW. Preserved CO and CI. ? ?TAVR 09/03/2018: Edwards Sapien 3 THV (size 23 mm, model # 9600TFX, serial # [id number]) by Lilly Cove and Darlina Guys. ? ?CXR PA/LAT 11/07/2018:  ?Cardiac shadow is enlarged but stable. Changes of prior TAVR ar again noted. Aortic calcifications are seen. Previously noted left upper lobe pneumonia has resolved in the interval. No focal infiltrate or sizable effusion is seen. Changes of prior vertebral augmentation are noted. ? ?Carotid artery duplex 01/05/2021: ?Minimal stenosis in the right internal carotid artery (1-15%). Stenosis in the right external carotid artery (<50%). ?Duplex suggests stenosis in the left internal carotid artery (50-69%). Stenosis in the left external carotid artery (<50%). ?Antegrade right vertebral artery flow. Antegrade left vertebral artery flow. ?Follow up in six months is appropriate if clinically indicated.  No significant change since 06/23/2020. ? ?PCV ECHOCARDIOGRAM COMPLETE 02/01/2021  ?Normal LV systolic function with visual EF 60-65%. Left ventricle cavity is normal in size. Normal global wall motion. Normal diastolic filling pattern, elevated LAP. ?S/P bioprosthetic aortic valve (Edwards Sapien 3 THV size 23 mm) well seated, no evidence of dehiscence, no perivalvular regurgitation. No valvular stenosis or regurgitation. ?Trace tricuspid regurgitation. No evidence of pulmonary hypertension. ?Compared to prior study dated 08/20/2019: no significant change. ?   ?EKG  ? ?EKG 01/24/2022: Normal sinus rhythm at rate of 84 bpm, no evidence of ischemia, normal EKG. no  significant change from 01/09/2021.  ? ?Assessment:  ? ?  ICD-10-CM   ?1. TAVR 09/03/2018: Edwards Sapien 3 THV (size 23 mm, model # 9600TFX, serial # [id number])  Z95.2 PCV ECHOCARDIOGRAM COMPLETE  ?  ?2. Primary hypertension  I10 EKG 12-Lead  ?  ?3. Hypercholesteremia  E78.00   ?  ?4. Asymptomatic bilateral carotid artery stenosis  I65.23 PCV CAROTID DUPLEX (BILATERAL)  ?  ?No orders of the defined types were placed in this encounter. ?  ?Medications Discontinued During This Encounter  ?Medication Reason  ? COVID-19 mRNA bivalent vaccine, Pfizer, (PFIZER COVID-19 VAC BIVALENT) injection Completed Course  ? COVID-19 mRNA Vac-TriS,  Pfizer, (PFIZER-BIONT COVID-19 VAC-TRIS) SUSP injection Completed Course  ? influenza vaccine adjuvanted (FLUAD) 0.5 ML injection Completed Course  ? pantoprazole (PROTONIX) 40 MG tablet Change in therapy  ? valsartan (DIOVAN) 320 MG tablet   ?  ?Recommendations:  ? ?Mrs. [place] Teti is a fairly active 88 y.o. Caucasian female with history of possibly mild coronary spasm by coronary angiogram in 2014,  severe AS s/p TAVR by Dr. Roxy Manns on 09/03/2018, asymptomatic bilateral carotid artery stenosis,  essential hypertension, hyperlipidemia, hyperglycemia and GERD. ? ?She is here on annual office visit and follow-up. Her EKG is normal, physical examination reveals the left carotid bruit slightly more prominent.  Prosthetic AV is functioning normally. Dyspnea is class I-II, no chf on exam.  SPECT her dyspnea is related to deconditioning.  I have again encouraged her to start an exercise program.  Will schedule for annual echo for assessment of TAVR.   ? ?Carotid artery stenosis is remained stable, will order carotid ultrasound for coronary artery stenosis.  I simply reassured her.  She is on appropriate medical therapy.  I reviewed her external labs.  Lipids are at goal, normal renal function and good BP control.  I will see her back on 6 month later. ? ? ? ?Adrian Prows, MD, [facility] ?01/24/2022, 5:10  PM ?Office: [phone]  ?

## 2022-02-13 ENCOUNTER — Telehealth: Payer: Self-pay | Admitting: Podiatry

## 2022-02-13 ENCOUNTER — Encounter (INDEPENDENT_AMBULATORY_CARE_PROVIDER_SITE_OTHER): Payer: Medicare Other | Admitting: Ophthalmology

## 2022-02-13 DIAGNOSIS — H43813 Vitreous degeneration, bilateral: Secondary | ICD-10-CM | POA: Diagnosis not present

## 2022-02-13 DIAGNOSIS — H35371 Puckering of macula, right eye: Secondary | ICD-10-CM | POA: Diagnosis not present

## 2022-02-13 DIAGNOSIS — H353231 Exudative age-related macular degeneration, bilateral, with active choroidal neovascularization: Secondary | ICD-10-CM

## 2022-02-13 DIAGNOSIS — H35033 Hypertensive retinopathy, bilateral: Secondary | ICD-10-CM | POA: Diagnosis not present

## 2022-02-13 DIAGNOSIS — I10 Essential (primary) hypertension: Secondary | ICD-10-CM | POA: Diagnosis not present

## 2022-02-13 NOTE — Telephone Encounter (Signed)
Pt left message Friday that she got a call Friday afternoon stating she has an appt on 4.26 but she had cxled it that morning. ? ?I returned call and left message that the appt has been cxled and to call if she would like to r/s ?

## 2022-02-14 ENCOUNTER — Other Ambulatory Visit: Payer: Medicare Other

## 2022-02-15 ENCOUNTER — Ambulatory Visit: Payer: Medicare Other

## 2022-02-15 ENCOUNTER — Ambulatory Visit: Payer: Medicare Other | Admitting: Podiatry

## 2022-02-15 DIAGNOSIS — Z952 Presence of prosthetic heart valve: Secondary | ICD-10-CM

## 2022-02-15 DIAGNOSIS — I6523 Occlusion and stenosis of bilateral carotid arteries: Secondary | ICD-10-CM

## 2022-03-27 ENCOUNTER — Encounter (INDEPENDENT_AMBULATORY_CARE_PROVIDER_SITE_OTHER): Payer: Medicare Other | Admitting: Ophthalmology

## 2022-03-27 DIAGNOSIS — I10 Essential (primary) hypertension: Secondary | ICD-10-CM | POA: Diagnosis not present

## 2022-03-27 DIAGNOSIS — H35372 Puckering of macula, left eye: Secondary | ICD-10-CM | POA: Diagnosis not present

## 2022-03-27 DIAGNOSIS — H353231 Exudative age-related macular degeneration, bilateral, with active choroidal neovascularization: Secondary | ICD-10-CM

## 2022-03-27 DIAGNOSIS — H43813 Vitreous degeneration, bilateral: Secondary | ICD-10-CM | POA: Diagnosis not present

## 2022-03-27 DIAGNOSIS — H35033 Hypertensive retinopathy, bilateral: Secondary | ICD-10-CM | POA: Diagnosis not present

## 2022-03-31 ENCOUNTER — Telehealth: Payer: Self-pay

## 2022-03-31 DIAGNOSIS — R0609 Other forms of dyspnea: Secondary | ICD-10-CM

## 2022-03-31 NOTE — Telephone Encounter (Signed)
ICD-10-CM   1. Dyspnea on exertion  N02.72 Basic metabolic panel    Brain natriuretic peptide     Patient is worsening dyspnea, for the past 1 week.  No PND or orthopnea or leg edema.  No hemoptysis.  No associated chest pain.  Advised her to increase hydrochlorothiazide to 25 mg daily, will obtain a BMP today along with a BNP to exclude CHF.  Patient has had echocardiogram recently just about 2 to 3 months ago hence do not suspect she will need repeat echocardiogram.  I will set her up to see [place] in the office on Monday or Tuesday.   Adrian Prows, MD, [facility] 03/31/2022, 3:22 PM Office: [phone] Fax: [phone] Pager: [phone]

## 2022-03-31 NOTE — Addendum Note (Signed)
Addended by: Kela Millin on: 03/31/2022 03:23 PM   Modules accepted: Orders

## 2022-03-31 NOTE — Telephone Encounter (Signed)
Patient has been having SOB for atleast a week and has been looking a little flushed, per her DIL. Patient wants to know if if she can wait until Monday to be seen, or does she need to go to ER. Patient did not feel like she needed the ER, but will go if you think she does need to. Please advise. Patient DIL says that patient denies dizzyness and chest pain. I tried to call her to make sure, but NA, [place] [phone]

## 2022-04-01 ENCOUNTER — Encounter: Payer: Self-pay | Admitting: Cardiology

## 2022-04-01 LAB — BASIC METABOLIC PANEL
BUN/Creatinine Ratio: 24 (ref 12–28)
BUN: 19 mg/dL (ref 8–27)
CO2: 21 mmol/L (ref 20–29)
Calcium: 8.3 mg/dL — ABNORMAL LOW (ref 8.7–10.3)
Chloride: 91 mmol/L — ABNORMAL LOW (ref 96–106)
Creatinine, Ser: 0.79 mg/dL (ref 0.57–1.00)
Glucose: 92 mg/dL (ref 70–99)
Potassium: 3.8 mmol/L (ref 3.5–5.2)
Sodium: 125 mmol/L — ABNORMAL LOW (ref 134–144)
eGFR: 73 mL/min/{1.73_m2} (ref >59)

## 2022-04-01 LAB — BRAIN NATRIURETIC PEPTIDE: BNP: 312.8 pg/mL — ABNORMAL HIGH (ref 0.0–100.0)

## 2022-04-02 ENCOUNTER — Other Ambulatory Visit: Payer: Self-pay

## 2022-04-02 ENCOUNTER — Encounter (HOSPITAL_COMMUNITY): Payer: Self-pay

## 2022-04-02 ENCOUNTER — Ambulatory Visit (HOSPITAL_COMMUNITY)
Admission: EM | Admit: 2022-04-02 | Discharge: 2022-04-02 | Discharge status: Absent without leave | Payer: Medicare Other

## 2022-04-02 ENCOUNTER — Inpatient Hospital Stay (HOSPITAL_COMMUNITY)
Admission: EM | Admit: 2022-04-02 | Discharge: 2022-04-07 | DRG: 291 | Disposition: A | Discharge status: Home Health | Payer: Medicare Other | Attending: Internal Medicine | Admitting: Internal Medicine

## 2022-04-02 ENCOUNTER — Emergency Department (HOSPITAL_COMMUNITY): Payer: Medicare Other

## 2022-04-02 DIAGNOSIS — I130 Hypertensive heart and chronic kidney disease with heart failure and stage 1 through stage 4 chronic kidney disease, or unspecified chronic kidney disease: Secondary | ICD-10-CM | POA: Diagnosis not present

## 2022-04-02 DIAGNOSIS — T502X5A Adverse effect of carbonic-anhydrase inhibitors, benzothiadiazides and other diuretics, initial encounter: Secondary | ICD-10-CM | POA: Diagnosis present

## 2022-04-02 DIAGNOSIS — R0902 Hypoxemia: Secondary | ICD-10-CM | POA: Diagnosis present

## 2022-04-02 DIAGNOSIS — Z6830 Body mass index (BMI) 30.0-30.9, adult: Secondary | ICD-10-CM

## 2022-04-02 DIAGNOSIS — D509 Iron deficiency anemia, unspecified: Secondary | ICD-10-CM

## 2022-04-02 DIAGNOSIS — E871 Hypo-osmolality and hyponatremia: Secondary | ICD-10-CM | POA: Diagnosis not present

## 2022-04-02 DIAGNOSIS — Z79890 Hormone replacement therapy (postmenopausal): Secondary | ICD-10-CM

## 2022-04-02 DIAGNOSIS — Z87442 Personal history of urinary calculi: Secondary | ICD-10-CM

## 2022-04-02 DIAGNOSIS — Y92009 Unspecified place in unspecified non-institutional (private) residence as the place of occurrence of the external cause: Secondary | ICD-10-CM

## 2022-04-02 DIAGNOSIS — Z9842 Cataract extraction status, left eye: Secondary | ICD-10-CM

## 2022-04-02 DIAGNOSIS — E039 Hypothyroidism, unspecified: Secondary | ICD-10-CM | POA: Diagnosis present

## 2022-04-02 DIAGNOSIS — Z8249 Family history of ischemic heart disease and other diseases of the circulatory system: Secondary | ICD-10-CM

## 2022-04-02 DIAGNOSIS — Z881 Allergy status to other antibiotic agents status: Secondary | ICD-10-CM

## 2022-04-02 DIAGNOSIS — Z90710 Acquired absence of both cervix and uterus: Secondary | ICD-10-CM

## 2022-04-02 DIAGNOSIS — Z7982 Long term (current) use of aspirin: Secondary | ICD-10-CM

## 2022-04-02 DIAGNOSIS — I5033 Acute on chronic diastolic (congestive) heart failure: Secondary | ICD-10-CM | POA: Diagnosis present

## 2022-04-02 DIAGNOSIS — Z96653 Presence of artificial knee joint, bilateral: Secondary | ICD-10-CM | POA: Diagnosis present

## 2022-04-02 DIAGNOSIS — I509 Heart failure, unspecified: Secondary | ICD-10-CM

## 2022-04-02 DIAGNOSIS — Z833 Family history of diabetes mellitus: Secondary | ICD-10-CM

## 2022-04-02 DIAGNOSIS — N179 Acute kidney failure, unspecified: Secondary | ICD-10-CM | POA: Diagnosis not present

## 2022-04-02 DIAGNOSIS — Z953 Presence of xenogenic heart valve: Secondary | ICD-10-CM

## 2022-04-02 DIAGNOSIS — Z79899 Other long term (current) drug therapy: Secondary | ICD-10-CM

## 2022-04-02 DIAGNOSIS — N182 Chronic kidney disease, stage 2 (mild): Secondary | ICD-10-CM | POA: Diagnosis present

## 2022-04-02 DIAGNOSIS — R300 Dysuria: Secondary | ICD-10-CM | POA: Diagnosis present

## 2022-04-02 DIAGNOSIS — Z853 Personal history of malignant neoplasm of breast: Secondary | ICD-10-CM

## 2022-04-02 DIAGNOSIS — Z9841 Cataract extraction status, right eye: Secondary | ICD-10-CM

## 2022-04-02 DIAGNOSIS — E669 Obesity, unspecified: Secondary | ICD-10-CM | POA: Diagnosis present

## 2022-04-02 DIAGNOSIS — K219 Gastro-esophageal reflux disease without esophagitis: Secondary | ICD-10-CM | POA: Diagnosis present

## 2022-04-02 DIAGNOSIS — E785 Hyperlipidemia, unspecified: Secondary | ICD-10-CM | POA: Diagnosis present

## 2022-04-02 DIAGNOSIS — N8110 Cystocele, unspecified: Secondary | ICD-10-CM | POA: Diagnosis present

## 2022-04-02 DIAGNOSIS — Z823 Family history of stroke: Secondary | ICD-10-CM

## 2022-04-02 DIAGNOSIS — Z888 Allergy status to other drugs, medicaments and biological substances status: Secondary | ICD-10-CM

## 2022-04-02 DIAGNOSIS — Z952 Presence of prosthetic heart valve: Secondary | ICD-10-CM

## 2022-04-02 DIAGNOSIS — I10 Essential (primary) hypertension: Secondary | ICD-10-CM | POA: Diagnosis present

## 2022-04-02 DIAGNOSIS — Z880 Allergy status to penicillin: Secondary | ICD-10-CM

## 2022-04-02 DIAGNOSIS — E66811 Obesity, class 1: Secondary | ICD-10-CM

## 2022-04-02 LAB — URINALYSIS, ROUTINE W REFLEX MICROSCOPIC
Bacteria, UA: NONE SEEN
Bilirubin Urine: NEGATIVE
Glucose, UA: NEGATIVE mg/dL
Hgb urine dipstick: NEGATIVE
Ketones, ur: NEGATIVE mg/dL
Nitrite: NEGATIVE
Protein, ur: NEGATIVE mg/dL
Specific Gravity, Urine: 1.005 (ref 1.005–1.030)
pH: 7 (ref 5.0–8.0)

## 2022-04-02 LAB — CBC WITH DIFFERENTIAL/PLATELET
Abs Immature Granulocytes: 0.14 10*3/uL — ABNORMAL HIGH (ref 0.00–0.07)
Basophils Absolute: 0 10*3/uL (ref 0.0–0.1)
Basophils Relative: 0 %
Eosinophils Absolute: 0 10*3/uL (ref 0.0–0.5)
Eosinophils Relative: 0 %
HCT: 25.1 % — ABNORMAL LOW (ref 36.0–46.0)
Hemoglobin: 8.2 g/dL — ABNORMAL LOW (ref 12.0–15.0)
Immature Granulocytes: 1 %
Lymphocytes Relative: 10 %
Lymphs Abs: 1.1 10*3/uL (ref 0.7–4.0)
MCH: 27.9 pg (ref 26.0–34.0)
MCHC: 32.7 g/dL (ref 30.0–36.0)
MCV: 85.4 fL (ref 80.0–100.0)
Monocytes Absolute: 1.3 10*3/uL — ABNORMAL HIGH (ref 0.1–1.0)
Monocytes Relative: 12 %
Neutro Abs: 8 10*3/uL — ABNORMAL HIGH (ref 1.7–7.7)
Neutrophils Relative %: 77 %
Platelets: 263 10*3/uL (ref 150–400)
RBC: 2.94 MIL/uL — ABNORMAL LOW (ref 3.87–5.11)
RDW: 20.7 % — ABNORMAL HIGH (ref 11.5–15.5)
WBC: 10.6 10*3/uL — ABNORMAL HIGH (ref 4.0–10.5)
nRBC: 0.3 % — ABNORMAL HIGH (ref 0.0–0.2)

## 2022-04-02 LAB — TSH: TSH: 0.891 u[IU]/mL (ref 0.350–4.500)

## 2022-04-02 LAB — BASIC METABOLIC PANEL
Anion gap: 9 (ref 5–15)
BUN: 16 mg/dL (ref 8–23)
CO2: 25 mmol/L (ref 22–32)
Calcium: 8.4 mg/dL — ABNORMAL LOW (ref 8.9–10.3)
Chloride: 89 mmol/L — ABNORMAL LOW (ref 98–111)
Creatinine, Ser: 0.8 mg/dL (ref 0.44–1.00)
GFR, Estimated: >60 mL/min (ref >60)
Glucose, Bld: 114 mg/dL — ABNORMAL HIGH (ref 70–99)
Potassium: 4.1 mmol/L (ref 3.5–5.1)
Sodium: 123 mmol/L — ABNORMAL LOW (ref 135–145)

## 2022-04-02 LAB — MAGNESIUM: Magnesium: 1.8 mg/dL (ref 1.7–2.4)

## 2022-04-02 LAB — SODIUM, URINE, RANDOM: Sodium, Ur: 112 mmol/L

## 2022-04-02 LAB — OSMOLALITY: Osmolality: 269 mOsm/kg — ABNORMAL LOW (ref 275–295)

## 2022-04-02 LAB — OSMOLALITY, URINE: Osmolality, Ur: 278 mOsm/kg — ABNORMAL LOW (ref 300–900)

## 2022-04-02 LAB — BRAIN NATRIURETIC PEPTIDE: B Natriuretic Peptide: 336.1 pg/mL — ABNORMAL HIGH (ref 0.0–100.0)

## 2022-04-02 MED ORDER — LEVOTHYROXINE SODIUM 100 MCG PO TABS
100.0000 ug | ORAL_TABLET | Freq: Every day | ORAL | Status: DC
  Administered 2022-04-03 – 2022-04-07 (×5): 100 ug via ORAL
  Filled 2022-04-02 (×5): qty 1

## 2022-04-02 MED ORDER — FUROSEMIDE 10 MG/ML IJ SOLN
20.0000 mg | Freq: Once | INTRAMUSCULAR | Status: AC
  Administered 2022-04-02: 20 mg via INTRAVENOUS
  Filled 2022-04-02: qty 2

## 2022-04-02 MED ORDER — LOSARTAN POTASSIUM 25 MG PO TABS
25.0000 mg | ORAL_TABLET | Freq: Every day | ORAL | Status: DC
  Administered 2022-04-03 – 2022-04-04 (×2): 25 mg via ORAL
  Filled 2022-04-02 (×2): qty 1

## 2022-04-02 MED ORDER — CARVEDILOL 12.5 MG PO TABS
12.5000 mg | ORAL_TABLET | Freq: Two times a day (BID) | ORAL | Status: DC
  Administered 2022-04-03 – 2022-04-07 (×9): 12.5 mg via ORAL
  Filled 2022-04-02 (×8): qty 1
  Filled 2022-04-02: qty 4

## 2022-04-02 MED ORDER — POLYVINYL ALCOHOL 1.4 % OP SOLN
1.0000 [drp] | Freq: Every evening | OPHTHALMIC | Status: DC
Start: 2022-04-02 — End: 2022-04-07
  Administered 2022-04-03 – 2022-04-06 (×4): 1 [drp] via OPHTHALMIC
  Filled 2022-04-02: qty 15

## 2022-04-02 MED ORDER — ACETAMINOPHEN 325 MG PO TABS: 650.0000 mg | ORAL_TABLET | Freq: Four times a day (QID) | ORAL | Status: DC | PRN

## 2022-04-02 MED ORDER — SODIUM CHLORIDE 0.9% FLUSH
3.0000 mL | INTRAVENOUS | Status: DC | PRN
  Administered 2022-04-06: 3 mL via INTRAVENOUS

## 2022-04-02 MED ORDER — PRAVASTATIN SODIUM 40 MG PO TABS
40.0000 mg | ORAL_TABLET | Freq: Every evening | ORAL | Status: DC
  Administered 2022-04-02 – 2022-04-06 (×5): 40 mg via ORAL
  Filled 2022-04-02 (×5): qty 1

## 2022-04-02 MED ORDER — ACETAMINOPHEN 650 MG RE SUPP: 650.0000 mg | Freq: Four times a day (QID) | RECTAL | Status: DC | PRN

## 2022-04-02 MED ORDER — SODIUM CHLORIDE 0.9% FLUSH
3.0000 mL | Freq: Two times a day (BID) | INTRAVENOUS | Status: DC
  Administered 2022-04-02 – 2022-04-06 (×8): 3 mL via INTRAVENOUS

## 2022-04-02 MED ORDER — PANTOPRAZOLE SODIUM 40 MG PO TBEC
40.0000 mg | DELAYED_RELEASE_TABLET | Freq: Every day | ORAL | Status: DC
  Administered 2022-04-03 – 2022-04-05 (×3): 40 mg via ORAL
  Filled 2022-04-02 (×3): qty 1

## 2022-04-02 MED ORDER — SODIUM CHLORIDE 0.9 % IV SOLN: 250.0000 mL | INTRAVENOUS | Status: DC | PRN

## 2022-04-02 MED ORDER — ASPIRIN 81 MG PO CHEW
81.0000 mg | CHEWABLE_TABLET | Freq: Every day | ORAL | Status: DC
  Administered 2022-04-02 – 2022-04-07 (×6): 81 mg via ORAL
  Filled 2022-04-02 (×6): qty 1

## 2022-04-02 MED ORDER — FUROSEMIDE 10 MG/ML IJ SOLN: 40.0000 mg | Freq: Once | INTRAMUSCULAR | Status: DC

## 2022-04-02 MED ORDER — ENOXAPARIN SODIUM 40 MG/0.4ML IJ SOSY
40.0000 mg | PREFILLED_SYRINGE | INTRAMUSCULAR | Status: DC
  Administered 2022-04-02 – 2022-04-06 (×5): 40 mg via SUBCUTANEOUS
  Filled 2022-04-02 (×5): qty 0.4

## 2022-04-02 MED ORDER — PROSIGHT PO TABS
1.0000 | ORAL_TABLET | Freq: Two times a day (BID) | ORAL | Status: DC
Start: 2022-04-02 — End: 2022-04-07
  Administered 2022-04-03 – 2022-04-07 (×9): 1 via ORAL
  Filled 2022-04-02 (×11): qty 1

## 2022-04-02 NOTE — Assessment & Plan Note (Signed)
Elevated. Holding hctz with hyponatremia Continue cozaar, coreg and will monitor with IV lasix

## 2022-04-02 NOTE — Assessment & Plan Note (Signed)
Continue pravachol.  

## 2022-04-02 NOTE — ED Provider Triage Note (Signed)
Emergency Medicine Provider Triage Evaluation Note  [name] , a 88 y.o. female  was evaluated in triage.  Pt complains of Shortness of breath x 6 days.  She is followed by Dr. Einar Gip, she did speak to him approximately 3 days ago, was started on double dose of HCTZ, reports she continues to be very short of breath.  She had a hard time today with ambulating from her kitchen to her bedroom.  She did have some egg today with some low-sodium bacon, is unsure whether this caused that.  There has not been much improvement after doubling the dose of HCTZ.  She was told by Dr. Nicolette Bang to be seen in the ER and likely will need to receive IV Lasix. Review of Systems  Positive: Of breath, leg swelling Negative: Fever, cough, chest pain  Physical Exam  BP (!) 176/80 (BP Location: Left Arm)   Pulse 72   Temp 99.2 F (37.3 C) (Oral)   Resp (!) 22   SpO2 96%  Gen:   Awake, no distress   Resp:  Normal effort  MSK:   Moves extremities without difficulty  Other:  1+ swelling to bilateral lower extremities.  Lungs are clear to auscultation.  Medical Decision Making  Medically screening exam initiated at 11:54 AM.  Appropriate orders placed.  [place] B Ogle was informed that the remainder of the evaluation will be completed by another provider, this initial triage assessment does not replace that evaluation, and the importance of remaining in the ED until their evaluation is complete.     Janeece Fitting, PA-C 04/02/22 1159

## 2022-04-02 NOTE — Progress Notes (Signed)
I discussed the results of the blood test this morning with Zoe [name], she is extremely short of breath, just walking from her room to the telephone she was short of breath.  I have recommended that she go to the emergency room to be further evaluated, options will be receiving IV Lasix and if she feels better and has good diuresis, she can be discharged home as she has an appointment to see [place] the following morning otherwise patient will need admission and observation.  Patient will be going to [facility] emergency department this morning.

## 2022-04-02 NOTE — H&P (Signed)
History and Physical    Patient: Zoe [name] EML:[id number] DOB: Jan 31, 1934 DOA: 04/02/2022 DOS: the patient was seen and examined on 04/02/2022 [facility]: [facility]  Patient coming from: Home - lives with her husband. Uses cane to walk.    Chief Complaint: shortness of breath/dyspnea on exertion   HPI: [place] B Gaber is a 88 y.o. female with medical history significant of hx of breast cancer, GERD, HTN, HLD, hypothyroidism, aortic stenosis s/p TAVR who presented to ED for worsening dyspnea on exertion x 1 week. She talked to her cardiologist who recommended she come to ED for IV diuresis. She also has had a strange feeling in her chest that she can't describe. Its not pain or pressure. She denies any palpitations. She has not been sick recently.  She has some mild congestion with intermittent cough. She wonders if it's allergies. She states its more to clear her throat. She also has new lower leg swelling over the past week. She denies any weight gain. She also admits to mild orthopnea.   Her cardiologist increased her hctz over the past 2 days.  Denies any fever/chills, vision changes/headaches, chest pain or palpitations,  cough, abdominal pain, N/V/D, she has some mild dysuria.   Does not smoke or drink alcohol   ER Course:  vitals: afebrile, bp: 168/82, HR: 69, RR: 14, oxygen: 95%RA Pertinent labs: hgb 8.2, wbc: 10.6, BNP: 336, sodium: 123  CXR: no acute disease. Mildly enlarged heart  In ED: given '20mg'$  lasix     Review of Systems: As mentioned in the history of present illness. All other systems reviewed and are negative. Past Medical History:  Diagnosis Date   Arthritis    some - per patient   Breast cancer ([place])    breast cancer / left    Cataract    bilat    GERD (gastroesophageal reflux disease)    History of kidney stones    Hyperlipidemia    Hypertension    Hypothyroidism    Macular degeneration    Left   S/P TAVR (transcatheter aortic  valve replacement) 09/03/2018   23 mm Edwards Sapien 3 transcatheter heart valve placed via percutaneous right transfemoral approach    Severe aortic stenosis    Stress incontinence    Thyroid disease    Tinnitus    Past Surgical History:  Procedure Laterality Date   ABDOMINAL HYSTERECTOMY  1970's   BACK SURGERY     BREAST LUMPECTOMY  12/1998   lumpectomy   CARDIAC CATHETERIZATION     EYE SURGERY     cataract surgery bilat    INTRAOPERATIVE TRANSTHORACIC ECHOCARDIOGRAM N/A 09/03/2018   Procedure: INTRAOPERATIVE TRANSTHORACIC ECHOCARDIOGRAM;  Surgeon: Burnell Blanks, MD;  Location: [place];  Service: Open Heart Surgery;  Laterality: N/A;   LITHOTRIPSY     Right total knee     2018 Dr. Alvan Dame   RIGHT/LEFT HEART CATH AND CORONARY ANGIOGRAPHY N/A 08/06/2018   Procedure: RIGHT/LEFT HEART CATH AND CORONARY ANGIOGRAPHY;  Surgeon: Adrian Prows, MD;  Location: [place] [facility];  Service: Cardiovascular;  Laterality: N/A;   THYROIDECTOMY, PARTIAL  1975   TONSILLECTOMY     as a child - patient not sure of exact date   TOTAL KNEE ARTHROPLASTY Left 03/13/2016   Procedure: TOTAL KNEE ARTHROPLASTY;  Surgeon: Paralee Cancel, MD;  Location: [facility];  Service: Orthopedics;  Laterality: Left;   TOTAL KNEE ARTHROPLASTY Right 06/18/2017   Procedure: RIGHT TOTAL KNEE ARTHROPLASTY;  Surgeon: Paralee Cancel, MD;  Location: [facility];  Service: Orthopedics;  Laterality: Right;   TRANSCATHETER AORTIC VALVE REPLACEMENT, TRANSFEMORAL N/A 09/03/2018   Procedure: TRANSCATHETER AORTIC VALVE REPLACEMENT, TRANSFEMORAL;  Surgeon: Burnell Blanks, MD;  Location: [place];  Service: Open Heart Surgery;  Laterality: N/A;   Social History:  reports that she has never smoked. She has never used smokeless tobacco. She reports that she does not drink alcohol and does not use drugs.  Allergies  Allergen Reactions   Penicillins Other (See Comments)    UNSPECIFIED REACTION  Patient does not remember reaction.  Has  patient had a PCN reaction causing immediate rash, facial/tongue/throat swelling, SOB or lightheadedness with hypotension: no Has patient had a PCN reaction causing severe rash involving mucus membranes or skin necrosis: no Has patient had a PCN reaction that required hospitalization no Has patient had a PCN reaction occurring within the last 10 years: no If all of the above answers are "NO", then may proceed with Cephalosporin use.    Sulfa Antibiotics     UNSPECIFIED REACTION  "maybe vision issues? "    Family History  Problem Relation Age of Onset   Diabetes Mother    Stroke Mother        Carotid artery disease   Heart disease Father        CAD   Coronary artery disease Father    Diabetes Sister     Prior to Admission medications   Medication Sig Start Date End Date Taking? Authorizing Provider  Artificial Tear Solution (SOOTHE XP) SOLN Place 1 drop into both eyes every evening.    [provider]  aspirin 81 MG chewable tablet Chew 1 tablet (81 mg total) by mouth daily. 09/05/18   Eileen Stanford, PA-C  carvedilol (COREG) 12.5 MG tablet Take 12.5 mg by mouth 2 (two) times daily with a meal.    [provider]  Cholecalciferol (VITAMIN D3) 2000 units TABS Take 2,000 Units by mouth daily.    [provider]  esomeprazole (NEXIUM) 20 MG capsule Take 20 mg by mouth daily. 12/06/21   [provider]  hydrochlorothiazide (MICROZIDE) 12.5 MG capsule Take 12.5 mg by mouth daily.    [provider]  levothyroxine (SYNTHROID, LEVOTHROID) 100 MCG tablet Take 100 mcg by mouth daily before breakfast. 12/29/15   [provider]  losartan (COZAAR) 25 MG tablet Take 1 tablet by mouth daily.    [provider]  Multiple Vitamins-Minerals (PRESERVISION AREDS 2) CAPS Take 1 capsule by mouth 2 (two) times daily.    [provider]  pravastatin (PRAVACHOL) 40 MG tablet Take 40 mg by mouth every evening.    [provider]    Physical Exam: Vitals:   04/02/22 1630 04/02/22 1645 04/02/22 1700 04/02/22 1715  BP: (!) 181/73 (!) 171/75 (!) 178/74 (!) 166/91  Pulse: 71 70 70 75  Resp: (!) 23 17 (!) 26 18  Temp:      TempSrc:      SpO2: 92% 96% 92% 95%   General:  Appears calm and comfortable and is in NAD Eyes:  PERRL, EOMI, normal lids, iris ENT:  grossly normal hearing, lips & tongue, mmm; appropriate dentition Neck:  no LAD, masses or thyromegaly; no carotid bruits, +JVD Cardiovascular:  RRR, 2/6 systolic murmur. Trace- 1+ LE edema.  Respiratory:   CTA bilaterally with no wheezes/rales/rhonchi.  Normal respiratory effort. Abdomen:  soft, NT, ND, NABS Back:   normal alignment, no CVAT Skin:  no rash or  induration seen on limited exam Musculoskeletal:  grossly normal tone BUE/BLE, good ROM, no bony abnormality Lower extremity:  No LE edema.  Limited foot exam with no ulcerations.  2+ distal pulses. Psychiatric:  grossly normal mood and affect, speech fluent and appropriate, AOx3 Neurologic:  CN 2-12 grossly intact, moves all extremities in coordinated fashion, sensation intact   Radiological Exams on Admission: Independently reviewed - see discussion in A/P where applicable  DG Chest 2 View  Result Date: 04/02/2022 CLINICAL DATA:  Triage notes:"Patient complains of shortness of breath and funny feeling in chest x 1 week. Had labs drawn yesterday and was called today and told sodium low and in heart failure. Patient alert and oriented, speaking complete sentences" EXAM: CHEST - 2 VIEW COMPARISON:  11/07/2018. FINDINGS: Cardiac silhouette is mildly enlarged. Stable changes from prior PAVR. No mediastinal or hilar masses or evidence of adenopathy. Lungs are clear.  No pleural effusion or pneumothorax. Previous vertebroplasty at L1, unchanged. IMPRESSION: No acute cardiopulmonary disease. Electronically Signed   By: Lajean Manes M.D.   On: 04/02/2022 12:39    EKG: Independently reviewed.  NSR with rate  73; nonspecific ST changes with no evidence of acute ischemia   Labs on Admission: I have personally reviewed the available labs and imaging studies at the time of the admission.  Pertinent labs:    hgb 8.2,  wbc: 10.6,  BNP: 336,  sodium: 123   Assessment and Plan: Principal Problem:   Acute exacerbation of CHF (congestive heart failure) (HCC) Active Problems:   Hyponatremia   Microcytic anemia   Hypertension   S/P TAVR (transcatheter aortic valve replacement)   Hypothyroidism   Hyperlipidemia    Assessment and Plan: * Acute exacerbation of CHF (congestive heart failure) ([place]) 88 year old with history of worsening dypsnea on exertion x 1 week with increased LE swelling, orthopnea, elevated BNP and JVD and cardiomegaly on CXR found to be in new CHF exacerbation -obs to telemetry -start low dose IV lasix at '20mg'$  and watch output. dayteam to adjust  -strict I/O and daily weights -recent echo in 4/23, but with finding repeat pending -continue ARB, beta blocker -checking covid    Hyponatremia Likely secondary to increase of her HCTZ and hypervolemia Will check urine studies/TSH/osmolality  Strict I/O Hold hctz Trend   Microcytic anemia Labs from 2 years ago show hgb of 9.5-10.4 No signs or symptoms of bleeding  Iron studies pending Trend   Hypertension Elevated. Holding hctz with hyponatremia Continue cozaar, coreg and will monitor with IV lasix   S/P TAVR (transcatheter aortic valve replacement) Echo 01/2022: mild prosthetic AV stenosis is new.  Hypothyroidism TSH pending Continue home synthroid 138mg daily   Hyperlipidemia Continue pravachol     Advance Care Planning:   Code Status: Full Code   Consults: none   DVT Prophylaxis: lovenox   Family Communication: none   Severity of Illness: The appropriate patient status for this patient is OBSERVATION. Observation status is judged to be reasonable and necessary in order to provide the required  intensity of service to ensure the patient's safety. The patient's presenting symptoms, physical exam findings, and initial radiographic and laboratory data in the context of their medical condition is felt to place them at decreased risk for further clinical deterioration. Furthermore, it is anticipated that the patient will be medically stable for discharge from the hospital within 2 midnights of admission.   Author: AOrma Flaming MD 04/02/2022 6:56 PM  For on call review [email or web address]

## 2022-04-02 NOTE — Assessment & Plan Note (Addendum)
Likely secondary to increase of her HCTZ and hypervolemia Will check urine studies/TSH/osmolality  Strict I/O Hold hctz Trend

## 2022-04-02 NOTE — ED Triage Notes (Signed)
Patient complains of shortness of breath and funny feeling in chest x 1 week. Had labs drawn yesterday and was called today and told sodium low and in heart failure. Patient alert and oriented, speaking complete sentences

## 2022-04-02 NOTE — Assessment & Plan Note (Addendum)
88 year old with history of worsening dypsnea on exertion x 1 week with increased LE swelling, orthopnea, elevated BNP and JVD and cardiomegaly on CXR found to be in new CHF exacerbation -obs to telemetry -start low dose IV lasix at '20mg'$  and watch output. dayteam to adjust  -strict I/O and daily weights -recent echo in 4/23, but with finding repeat pending -continue ARB, beta blocker -checking covid

## 2022-04-02 NOTE — ED Notes (Signed)
Dinner tray delivered.

## 2022-04-02 NOTE — Assessment & Plan Note (Signed)
Echo 01/2022: mild prosthetic AV stenosis is new.

## 2022-04-02 NOTE — Assessment & Plan Note (Signed)
Labs from 2 years ago show hgb of 9.5-10.4 No signs or symptoms of bleeding  Iron studies pending Trend

## 2022-04-02 NOTE — Assessment & Plan Note (Signed)
TSH pending Continue home synthroid 175mg daily

## 2022-04-02 NOTE — ED Provider Notes (Signed)
[facility]ERGENCY DEPARTMENT Provider Note   CSN: [id number] Arrival date & time: 04/02/22  1138     History  Chief Complaint  Patient presents with   Shortness of Breath    [place] B Johnstone is a 88 y.o. female.  MICHIE MOLNAR is a 88 y.o. female with a history of hypertension, thyroid disease, arthritis, GERD, breast cancer, aortic valve replacement, who presents to the emergency department for evaluation of shortness of breath.  Patient reports she has had worsening shortness of breath over the past 6 days.  She is followed by Dr. Nicolette Bang with cardiology and spoke to him 3 days ago, he had her come into the office to do lab work and doubled her dose of HCTZ.  She reports despite increasing this dose she has continued to have persistent and worsening shortness of breath as well as lower extremity swelling.  Today she reports she was quite winded and had a hard time ambulating from her kitchen to her bedroom without becoming winded.  Patient went in for lab work and was noted to have an elevated BNP as well as hyponatremia.  Was recommended to come to the ED for IV diuresis.  The history is provided by the patient.       Home Medications Prior to Admission medications   Medication Sig Start Date End Date Taking? Authorizing Provider  Artificial Tear Solution (SOOTHE XP) SOLN Place 1 drop into both eyes every evening.    [provider]  aspirin 81 MG chewable tablet Chew 1 tablet (81 mg total) by mouth daily. 09/05/18   Eileen Stanford, PA-C  carvedilol (COREG) 12.5 MG tablet Take 12.5 mg by mouth 2 (two) times daily with a meal.    [provider]  Cholecalciferol (VITAMIN D3) 2000 units TABS Take 2,000 Units by mouth daily.    [provider]  esomeprazole (NEXIUM) 20 MG capsule Take 20 mg by mouth daily. 12/06/21   [provider]  hydrochlorothiazide (MICROZIDE) 12.5 MG capsule Take 12.5 mg by mouth daily.     [provider]  levothyroxine (SYNTHROID, LEVOTHROID) 100 MCG tablet Take 100 mcg by mouth daily before breakfast. 12/29/15   [provider]  losartan (COZAAR) 25 MG tablet Take 1 tablet by mouth daily.    [provider]  Multiple Vitamins-Minerals (PRESERVISION AREDS 2) CAPS Take 1 capsule by mouth 2 (two) times daily.    [provider]  pravastatin (PRAVACHOL) 40 MG tablet Take 40 mg by mouth every evening.    [provider]      Allergies    Penicillins and Sulfa antibiotics    Review of Systems   Review of Systems  Constitutional:  Positive for fatigue. Negative for chills and fever.  Respiratory:  Positive for shortness of breath. Negative for cough.   Cardiovascular:  Positive for leg swelling. Negative for chest pain.  All other systems reviewed and are negative.   Physical Exam Updated Vital Signs BP (!) 166/91   Pulse 75   Temp 98.3 F (36.8 C) (Oral)   Resp 18   SpO2 95%  Physical Exam Vitals and nursing note reviewed.  Constitutional:      General: She is not in acute distress.    Appearance: Normal appearance. She is well-developed. She is not diaphoretic.  HENT:     Head: Normocephalic and atraumatic.  Eyes:     General:        Right eye: No discharge.  Left eye: No discharge.     Pupils: Pupils are equal, round, and reactive to light.  Cardiovascular:     Rate and Rhythm: Normal rate and regular rhythm.     Pulses: Normal pulses.     Heart sounds: Normal heart sounds.  Pulmonary:     Effort: Pulmonary effort is normal. No respiratory distress.     Breath sounds: Rales present. No wheezing.     Comments: Respirations equal and unlabored, mildly tachypneic, patient able to speak in full sentences, some increased work of breathing with any activity, faint rales in bilateral bases Chest:     Chest wall: No tenderness.  Abdominal:     General: Bowel sounds are normal. There is no distension.      Palpations: Abdomen is soft. There is no mass.     Tenderness: There is no abdominal tenderness. There is no guarding.     Comments: Abdomen soft, nondistended, nontender to palpation in all quadrants without guarding or peritoneal signs  Musculoskeletal:        General: No deformity.     Cervical back: Neck supple.     Right lower leg: Edema present.     Left lower leg: Edema present.     Comments: 1+ edema to bilateral lower extremities  Skin:    General: Skin is warm and dry.     Capillary Refill: Capillary refill takes less than 2 seconds.  Neurological:     Mental Status: She is alert and oriented to person, place, and time.     Coordination: Coordination normal.     Comments: Speech is clear, able to follow commands Moves extremities without ataxia, coordination intact  Psychiatric:        Mood and Affect: Mood normal.        Behavior: Behavior normal.     ED Results / Procedures / Treatments   Labs (all labs ordered are listed, but only abnormal results are displayed) Labs Reviewed  BASIC METABOLIC PANEL - Abnormal; Notable for the following components:      Result Value   Sodium 123 (*)    Chloride 89 (*)    Glucose, Bld 114 (*)    Calcium 8.4 (*)    All other components within normal limits  CBC WITH DIFFERENTIAL/PLATELET - Abnormal; Notable for the following components:   WBC 10.6 (*)    RBC 2.94 (*)    Hemoglobin 8.2 (*)    HCT 25.1 (*)    RDW 20.7 (*)    nRBC 0.3 (*)    Neutro Abs 8.0 (*)    Monocytes Absolute 1.3 (*)    Abs Immature Granulocytes 0.14 (*)    All other components within normal limits  BRAIN NATRIURETIC PEPTIDE - Abnormal; Notable for the following components:   B Natriuretic Peptide 336.1 (*)    All other components within normal limits  SARS CORONAVIRUS 2 BY RT PCR  SODIUM, URINE, RANDOM  OSMOLALITY, URINE  OSMOLALITY  TSH    EKG EKG Interpretation  Date/Time:  Sunday April 02 2022 11:49:25 EDT Ventricular Rate:  73 PR  Interval:  192 QRS Duration: 86 QT Interval:  402 QTC Calculation: 442 R Axis:   63 Text Interpretation: Normal sinus rhythm Normal ECG When compared with ECG of 04-Sep-2018 01:58, PREVIOUS ECG IS PRESENT Confirmed by Dene Gentry [phone]) on 04/02/2022 3:36:54 PM  Radiology DG Chest 2 View  Result Date: 04/02/2022 CLINICAL DATA:  Triage notes:"Patient complains of shortness of breath and funny feeling  in chest x 1 week. Had labs drawn yesterday and was called today and told sodium low and in heart failure. Patient alert and oriented, speaking complete sentences" EXAM: CHEST - 2 VIEW COMPARISON:  11/07/2018. FINDINGS: Cardiac silhouette is mildly enlarged. Stable changes from prior PAVR. No mediastinal or hilar masses or evidence of adenopathy. Lungs are clear.  No pleural effusion or pneumothorax. Previous vertebroplasty at L1, unchanged. IMPRESSION: No acute cardiopulmonary disease. Electronically Signed   By: Lajean Manes M.D.   On: 04/02/2022 12:39    Procedures Procedures    Medications Ordered in ED Medications  furosemide (LASIX) injection 20 mg (has no administration in time range)    ED Course/ Medical Decision Making/ A&P                           Medical Decision Making Risk Decision regarding hospitalization.   MALEIA WEEMS is a 88 y.o. female presents to the ED for concern of SOB, this involves an extensive number of treatment options, and is a complaint that carries with it a high risk of complications and morbidity.  The differential diagnosis includes CHF, pneumonia, ACS, PE, asthma, COPD   Additional history obtained:  Additional history obtained from husband at bedside External records from outside source obtained and reviewed including telephone encounters with Dr. Einar Gip over the past few days   Lab Tests:  I Ordered, reviewed, and interpreted labs.  The pertinent results include: Minimal leukocytosis, hemoglobin of 8.2, significant hyponatremia with  sodium of 123, mild hypochloremia as well, normal renal function, glucose 114.  BNP is elevated at 336.1.  Patient with no bleeding symptoms in the setting of low hemoglobin.   Imaging Studies ordered:  I ordered imaging studies including chest x-ray I independently visualized and interpreted imaging which showed mild cardiomegaly, no pulmonary edema noted. I agree with the radiologist interpretation   Cardiac Monitoring:  The patient was maintained on a cardiac monitor.  I personally viewed and interpreted the cardiac monitored which showed an underlying rhythm of: Normal sinus rhythm   Medicines ordered and prescription drug management:  I ordered medication including Lasix for shortness of breath and fluid overload I have reviewed the patients home medicines and have made adjustments as needed    ED Course:  Patient with shortness of breath worse with any activity, elevated BNP and some lower extremity edema suggestive of CHF, no frank pulmonary edema noted on chest x-ray.  Patient also with slightly worsened anemia but with no acute bleeding symptoms. Patient noted to have hyponatremia, with sodium of 123, no seizure or altered mental status, likely in the setting of increasing HCTZ dose. Patient is very anxious about going home given her worsening shortness of breath, will start patient on diuresis but feel she would benefit from admission and further monitoring to ensure sodium is improving as well as shortness of breath.   Consultations Obtained:  I requested consultation with the hospitalist for admission,  and discussed lab and imaging findings as well as pertinent plan -Dr. Eliberto Ivory will see and admit the patient    Dispostion:  After consideration of the diagnostic results and the patients response to treatment feel that the patent would benefit from admission.          Final Clinical Impression(s) / ED Diagnoses Final diagnoses:  Hyponatremia  Acute congestive  heart failure, unspecified heart failure type ([place])    Rx / DC Orders ED Discharge Orders  None         Janet Berlin 04/24/22 2145    Valarie Merino, MD 04/25/22 1158

## 2022-04-02 NOTE — ED Notes (Signed)
Pt placed on 2L [place] d/t pt desatting to 87%. Chux, brief, and purewick exchanged, new gown provided. Pt provided w/ comfort items, lights dimmed to pt preference, supportive communication to pt as pt reported feeling lonely. Pt now at this time denies any needs, no acute distress noted, pt bed repositioned for comfort. Call light within reach, bed locked.

## 2022-04-03 ENCOUNTER — Ambulatory Visit: Payer: Medicare Other | Admitting: Student

## 2022-04-03 ENCOUNTER — Observation Stay (HOSPITAL_COMMUNITY): Payer: Medicare Other

## 2022-04-03 DIAGNOSIS — Y92009 Unspecified place in unspecified non-institutional (private) residence as the place of occurrence of the external cause: Secondary | ICD-10-CM | POA: Diagnosis not present

## 2022-04-03 DIAGNOSIS — E669 Obesity, unspecified: Secondary | ICD-10-CM | POA: Diagnosis present

## 2022-04-03 DIAGNOSIS — Z9841 Cataract extraction status, right eye: Secondary | ICD-10-CM | POA: Diagnosis not present

## 2022-04-03 DIAGNOSIS — I509 Heart failure, unspecified: Secondary | ICD-10-CM

## 2022-04-03 DIAGNOSIS — Z853 Personal history of malignant neoplasm of breast: Secondary | ICD-10-CM | POA: Diagnosis not present

## 2022-04-03 DIAGNOSIS — Z953 Presence of xenogenic heart valve: Secondary | ICD-10-CM | POA: Diagnosis not present

## 2022-04-03 DIAGNOSIS — K219 Gastro-esophageal reflux disease without esophagitis: Secondary | ICD-10-CM | POA: Diagnosis present

## 2022-04-03 DIAGNOSIS — R300 Dysuria: Secondary | ICD-10-CM | POA: Diagnosis present

## 2022-04-03 DIAGNOSIS — Z833 Family history of diabetes mellitus: Secondary | ICD-10-CM | POA: Diagnosis not present

## 2022-04-03 DIAGNOSIS — I5031 Acute diastolic (congestive) heart failure: Secondary | ICD-10-CM | POA: Diagnosis not present

## 2022-04-03 DIAGNOSIS — I10 Essential (primary) hypertension: Secondary | ICD-10-CM | POA: Diagnosis not present

## 2022-04-03 DIAGNOSIS — Z888 Allergy status to other drugs, medicaments and biological substances status: Secondary | ICD-10-CM | POA: Diagnosis not present

## 2022-04-03 DIAGNOSIS — I5033 Acute on chronic diastolic (congestive) heart failure: Secondary | ICD-10-CM | POA: Diagnosis present

## 2022-04-03 DIAGNOSIS — N182 Chronic kidney disease, stage 2 (mild): Secondary | ICD-10-CM | POA: Diagnosis present

## 2022-04-03 DIAGNOSIS — E871 Hypo-osmolality and hyponatremia: Secondary | ICD-10-CM | POA: Diagnosis present

## 2022-04-03 DIAGNOSIS — E785 Hyperlipidemia, unspecified: Secondary | ICD-10-CM | POA: Diagnosis present

## 2022-04-03 DIAGNOSIS — E039 Hypothyroidism, unspecified: Secondary | ICD-10-CM | POA: Diagnosis present

## 2022-04-03 DIAGNOSIS — Z6830 Body mass index (BMI) 30.0-30.9, adult: Secondary | ICD-10-CM | POA: Diagnosis not present

## 2022-04-03 DIAGNOSIS — D509 Iron deficiency anemia, unspecified: Secondary | ICD-10-CM | POA: Diagnosis present

## 2022-04-03 DIAGNOSIS — Z823 Family history of stroke: Secondary | ICD-10-CM | POA: Diagnosis not present

## 2022-04-03 DIAGNOSIS — Z9842 Cataract extraction status, left eye: Secondary | ICD-10-CM | POA: Diagnosis not present

## 2022-04-03 DIAGNOSIS — Z96653 Presence of artificial knee joint, bilateral: Secondary | ICD-10-CM | POA: Diagnosis present

## 2022-04-03 DIAGNOSIS — Z87442 Personal history of urinary calculi: Secondary | ICD-10-CM | POA: Diagnosis not present

## 2022-04-03 DIAGNOSIS — Z880 Allergy status to penicillin: Secondary | ICD-10-CM | POA: Diagnosis not present

## 2022-04-03 DIAGNOSIS — I130 Hypertensive heart and chronic kidney disease with heart failure and stage 1 through stage 4 chronic kidney disease, or unspecified chronic kidney disease: Secondary | ICD-10-CM | POA: Diagnosis present

## 2022-04-03 DIAGNOSIS — Z881 Allergy status to other antibiotic agents status: Secondary | ICD-10-CM | POA: Diagnosis not present

## 2022-04-03 DIAGNOSIS — N179 Acute kidney failure, unspecified: Secondary | ICD-10-CM | POA: Diagnosis not present

## 2022-04-03 DIAGNOSIS — T502X5A Adverse effect of carbonic-anhydrase inhibitors, benzothiadiazides and other diuretics, initial encounter: Secondary | ICD-10-CM | POA: Diagnosis present

## 2022-04-03 LAB — IRON AND TIBC
Iron: 57 ug/dL (ref 28–170)
Saturation Ratios: 21 % (ref 10.4–31.8)
TIBC: 273 ug/dL (ref 250–450)
UIBC: 216 ug/dL

## 2022-04-03 LAB — BASIC METABOLIC PANEL
Anion gap: 9 (ref 5–15)
BUN: 18 mg/dL (ref 8–23)
CO2: 25 mmol/L (ref 22–32)
Calcium: 8 mg/dL — ABNORMAL LOW (ref 8.9–10.3)
Chloride: 90 mmol/L — ABNORMAL LOW (ref 98–111)
Creatinine, Ser: 0.94 mg/dL (ref 0.44–1.00)
GFR, Estimated: 59 mL/min — ABNORMAL LOW (ref >60)
Glucose, Bld: 99 mg/dL (ref 70–99)
Potassium: 3.8 mmol/L (ref 3.5–5.1)
Sodium: 124 mmol/L — ABNORMAL LOW (ref 135–145)

## 2022-04-03 LAB — ECHOCARDIOGRAM COMPLETE
AR max vel: 1.05 cm2
AV Area VTI: 1.13 cm2
AV Area mean vel: 1.09 cm2
AV Mean grad: 18.3 mmHg
AV Peak grad: 32.2 mmHg
Ao pk vel: 2.84 m/s
Area-P 1/2: 4.86 cm2
S' Lateral: 3.1 cm

## 2022-04-03 LAB — CBC
HCT: 24.9 % — ABNORMAL LOW (ref 36.0–46.0)
Hemoglobin: 8.1 g/dL — ABNORMAL LOW (ref 12.0–15.0)
MCH: 27.3 pg (ref 26.0–34.0)
MCHC: 32.5 g/dL (ref 30.0–36.0)
MCV: 83.8 fL (ref 80.0–100.0)
Platelets: 238 10*3/uL (ref 150–400)
RBC: 2.97 MIL/uL — ABNORMAL LOW (ref 3.87–5.11)
RDW: 20.6 % — ABNORMAL HIGH (ref 11.5–15.5)
WBC: 8.8 10*3/uL (ref 4.0–10.5)
nRBC: 0 % (ref 0.0–0.2)

## 2022-04-03 LAB — FERRITIN: Ferritin: 67 ng/mL (ref 11–307)

## 2022-04-03 MED ORDER — FUROSEMIDE 10 MG/ML IJ SOLN
20.0000 mg | Freq: Two times a day (BID) | INTRAMUSCULAR | Status: DC
  Administered 2022-04-03 (×2): 20 mg via INTRAVENOUS
  Filled 2022-04-03 (×2): qty 2

## 2022-04-03 NOTE — ED Notes (Signed)
Patient stating that her legs are cramping and that she is uncomfortable/can't sleep.

## 2022-04-03 NOTE — ED Notes (Signed)
Echo at the bedside °

## 2022-04-03 NOTE — Progress Notes (Deleted)
Primary Physician:  [facility]   Patient ID: Zoe [name], female    DOB: 1934/06/12, 88 y.o.   MRN: [id number]  Subjective:    No chief complaint on file.   HPI: Zoe [name]  is a 88 y.o. female  with history of possibly mild coronary spasm by coronary angiogram in 2014 when she presented with angina pectoris, otherwise no significant CAD, severe AS s/p TAVR by Dr. Roxy Manns on 11/12., asymptomatic carotid artery stenosis,  essential hypertension, hyperlipidemia, and GERD.   She is presently doing well except for mild chronic dyspnea, she has stopped exercising again for no particular reason but states that since her husband has COPD, they are worried about COVID.  No PND or orthopnea.  Denies TIA, denies headache, visual disturbances.   Past Medical History:  Diagnosis Date   Arthritis    some - per patient   Breast cancer ([place])    breast cancer / left    Cataract    bilat    GERD (gastroesophageal reflux disease)    History of kidney stones    Hyperlipidemia    Hypertension    Hypothyroidism    Macular degeneration    Left   S/P TAVR (transcatheter aortic valve replacement) 09/03/2018   23 mm Edwards Sapien 3 transcatheter heart valve placed via percutaneous right transfemoral approach    Severe aortic stenosis    Stress incontinence    Thyroid disease    Tinnitus     Social History   Tobacco Use   Smoking status: Never   Smokeless tobacco: Never  Substance Use Topics   Alcohol use: No     Review of Systems  Cardiovascular:  Positive for dyspnea on exertion. Negative for chest pain and leg swelling.   Objective:  There were no vitals taken for this visit. There is no height or weight on file to calculate BMI.     04/03/2022    6:00 AM 04/03/2022    5:00 AM 04/03/2022    4:00 AM  Vitals with BMI  Systolic 242    Diastolic 68    Pulse 67 71 70      Physical Exam Vitals reviewed.  Cardiovascular:     Rate and Rhythm: Normal  rate and regular rhythm.     Pulses: Normal pulses and intact distal pulses.          Carotid pulses are  on the right side with bruit and  on the left side with bruit.    Heart sounds: Murmur heard.     Early systolic murmur is present with a grade of 1/6 at the upper right sternal border.  Pulmonary:     Effort: Pulmonary effort is normal. No accessory muscle usage or respiratory distress.     Breath sounds: Normal breath sounds.  Abdominal:     General: Bowel sounds are normal.     Palpations: Abdomen is soft.  Musculoskeletal:     Right lower leg: No edema.  Skin:    Capillary Refill: Capillary refill takes less than 2 seconds.    Radiology: DG Chest 2 View  Result Date: 04/02/2022 CLINICAL DATA:  Triage notes:"Patient complains of shortness of breath and funny feeling in chest x 1 week. Had labs drawn yesterday and was called today and told sodium low and in heart failure. Patient alert and oriented, speaking complete sentences" EXAM: CHEST - 2 VIEW COMPARISON:  11/07/2018. FINDINGS: Cardiac silhouette is mildly enlarged. Stable changes from prior  PAVR. No mediastinal or hilar masses or evidence of adenopathy. Lungs are clear.  No pleural effusion or pneumothorax. Previous vertebroplasty at L1, unchanged. IMPRESSION: No acute cardiopulmonary disease. Electronically Signed   By: Lajean Manes M.D.   On: 04/02/2022 12:39    Laboratory examination:    External labs  Labs 10/04/2021:  A1c 5.7%.  TSH normal at 1.18.  Vitamin D38.6.  Hb 10.3/HCT 32.3, platelets 232.  BUN 23, creatinine 0.87, EGFR 60 mill, potassium 4.1.  Total cholesterol 113, triglycerides 100, HDL 43, LDL 50.  No current facility-administered medications for this visit.  Current Outpatient Medications:    Artificial Tear Solution (SOOTHE XP) SOLN, Place 1 drop into both eyes every evening., Disp: , Rfl:    aspirin 81 MG chewable tablet, Chew 1 tablet (81 mg total) by mouth daily. (Patient taking differently:  Chew 81 mg by mouth every evening.), Disp: , Rfl:    carvedilol (COREG) 12.5 MG tablet, Take 12.5 mg by mouth 2 (two) times daily with a meal., Disp: , Rfl:    Cholecalciferol (VITAMIN D3) 2000 units TABS, Take 2,000 Units by mouth daily., Disp: , Rfl:    ciprofloxacin (CILOXAN) 0.3 % ophthalmic solution, SMARTSIG:In Eye(s) (Patient not taking: Reported on 04/02/2022), Disp: , Rfl:    esomeprazole (NEXIUM) 20 MG capsule, Take 20 mg by mouth daily., Disp: , Rfl:    ferrous sulfate 325 (65 FE) MG tablet, Take 325 mg by mouth every other day., Disp: , Rfl:    hydrochlorothiazide (MICROZIDE) 12.5 MG capsule, Take 25 mg by mouth daily., Disp: , Rfl:    levothyroxine (SYNTHROID, LEVOTHROID) 100 MCG tablet, Take 100 mcg by mouth daily before breakfast., Disp: , Rfl: 2   losartan (COZAAR) 25 MG tablet, Take 25 mg by mouth daily. (Patient not taking: Reported on 04/02/2022), Disp: , Rfl:    Multiple Vitamins-Minerals (PRESERVISION AREDS 2) CAPS, Take 1 capsule by mouth 2 (two) times daily., Disp: , Rfl:    pravastatin (PRAVACHOL) 40 MG tablet, Take 40 mg by mouth every evening., Disp: , Rfl:    valsartan (DIOVAN) 320 MG tablet, Take 320 mg by mouth daily., Disp: , Rfl:   Facility-Administered Medications Ordered in Other Visits:    0.9 %  sodium chloride infusion, 250 mL, Intravenous, PRN, Orma Flaming, MD   acetaminophen (TYLENOL) tablet 650 mg, 650 mg, Oral, Q6H PRN **OR** acetaminophen (TYLENOL) suppository 650 mg, 650 mg, Rectal, Q6H PRN, Orma Flaming, MD   aspirin chewable tablet 81 mg, 81 mg, Oral, Daily, Orma Flaming, MD, 81 mg at 04/03/22 0800   carvedilol (COREG) tablet 12.5 mg, 12.5 mg, Oral, BID WC, Orma Flaming, MD, 12.5 mg at 04/03/22 0801   enoxaparin (LOVENOX) injection 40 mg, 40 mg, Subcutaneous, Q24H, Orma Flaming, MD, 40 mg at 04/02/22 2154   levothyroxine (SYNTHROID) tablet 100 mcg, 100 mcg, Oral, QAC breakfast, Orma Flaming, MD, 100 mcg at 04/03/22 0759   losartan (COZAAR)  tablet 25 mg, 25 mg, Oral, Daily, Orma Flaming, MD, 25 mg at 04/03/22 0800   multivitamin (PROSIGHT) tablet 1 tablet, 1 tablet, Oral, BID, Orma Flaming, MD   pantoprazole (PROTONIX) EC tablet 40 mg, 40 mg, Oral, Daily, Orma Flaming, MD, 40 mg at 04/03/22 0759   polyvinyl alcohol (LIQUIFILM TEARS) 1.4 % ophthalmic solution 1 drop, 1 drop, Both Eyes, QPM, Orma Flaming, MD   pravastatin (PRAVACHOL) tablet 40 mg, 40 mg, Oral, QPM, Orma Flaming, MD, 40 mg at 04/02/22 1855   sodium chloride flush (NS) 0.9 %  injection 3 mL, 3 mL, Intravenous, Q12H, Orma Flaming, MD, 3 mL at 04/02/22 2154   sodium chloride flush (NS) 0.9 % injection 3 mL, 3 mL, Intravenous, PRN, Orma Flaming, MD     Cardiac Studies:    Coronary angiogram 08/06/2018: normal coronary arteries. Moderate pulmonary hypertension due to AS with elevated PW. Preserved CO and CI.  TAVR 09/03/2018: Edwards Sapien 3 THV (size 23 mm, model # 9600TFX, serial # [id number]) by Lilly [place] and Darlina Guys.  CXR PA/LAT 11/07/2018:  Cardiac shadow is enlarged but stable. Changes of prior TAVR ar again noted. Aortic calcifications are seen. Previously noted left upper lobe pneumonia has resolved in the interval. No focal infiltrate or sizable effusion is seen. Changes of prior vertebral augmentation are noted.  Carotid artery duplex 01/05/2021: Minimal stenosis in the right internal carotid artery (1-15%). Stenosis in the right external carotid artery (<50%). Duplex suggests stenosis in the left internal carotid artery (50-69%). Stenosis in the left external carotid artery (<50%). Antegrade right vertebral artery flow. Antegrade left vertebral artery flow. Follow up in six months is appropriate if clinically indicated.  No significant change since 06/23/2020.  PCV ECHOCARDIOGRAM COMPLETE 02/01/2021  Normal LV systolic function with visual EF 60-65%. Left ventricle cavity is normal in size. Normal global wall motion. Normal diastolic filling  pattern, elevated LAP. S/P bioprosthetic aortic valve (Edwards Sapien 3 THV size 23 mm) well seated, no evidence of dehiscence, no perivalvular regurgitation. No valvular stenosis or regurgitation. Trace tricuspid regurgitation. No evidence of pulmonary hypertension. Compared to prior study dated 08/20/2019: no significant change.    EKG   EKG 01/24/2022: Normal sinus rhythm at rate of 84 bpm, no evidence of ischemia, normal EKG. no significant change from 01/09/2021.   Assessment:   No diagnosis found. No orders of the defined types were placed in this encounter.   There are no discontinued medications.   Recommendations:   Zoe [name] is a fairly active 88 y.o. Caucasian female with history of possibly mild coronary spasm by coronary angiogram in 2014,  severe AS s/p TAVR by Dr. Roxy Manns on 09/03/2018, asymptomatic bilateral carotid artery stenosis,  essential hypertension, hyperlipidemia, hyperglycemia and GERD.  She is here on annual office visit and follow-up. Her EKG is normal, physical examination reveals the left carotid bruit slightly more prominent.  Prosthetic AV is functioning normally. Dyspnea is class I-II, no chf on exam.  SPECT her dyspnea is related to deconditioning.  I have again encouraged her to start an exercise program.  Will schedule for annual echo for assessment of TAVR.    Carotid artery stenosis is remained stable, will order carotid ultrasound for coronary artery stenosis.  I simply reassured her.  She is on appropriate medical therapy.  I reviewed her external labs.  Lipids are at goal, normal renal function and good BP control.  I will see her back on 6 month later.    Alethia Berthold, MD, [facility] 04/03/2022, 8:18 AM Office: [phone]

## 2022-04-03 NOTE — Progress Notes (Signed)
Echocardiogram 2D Echocardiogram has been performed.  Joette Catching 04/03/2022, 8:48 AM

## 2022-04-03 NOTE — ED Notes (Signed)
Dr. Vann at bedside 

## 2022-04-03 NOTE — Progress Notes (Addendum)
PROGRESS NOTE    Zoe [name]  PJA:[id number] DOB: 08-Jul-1934 DOA: 04/02/2022 PCP: [facility]    Brief Narrative:  Zoe [name] is a 88 y.o. female with medical history significant of hx of breast cancer, GERD, HTN, HLD, hypothyroidism, aortic stenosis s/p TAVR who presented to ED for worsening dyspnea on exertion x 1 week. She talked to her cardiologist who recommended she come to ED for IV diuresis   Assessment and Plan: Acute exacerbation of CHF (congestive heart failure) ([place]) 88 year old with history of worsening dypsnea on exertion x 1 week with increased LE swelling, orthopnea, elevated BNP and JVD and cardiomegaly on CXR found to be in new CHF exacerbation -IV lasix BID -has not been weighing self at home -strict I/O and daily weights -recent echo in 4/23, but with finding repeat pending -continue ARB, beta blocker -pending echo read -Dr. Virgina Jock to see  Hyponatremia Likely secondary to increase of her HCTZ and hypervolemia Strict I/O Hold hctz Trend with diuresis  Microcytic anemia Labs from 2 years ago show hgb of 9.5-10.4 Trend   Hypertension Elevated Continue cozaar, coreg and will monitor with IV lasix  S/P TAVR (transcatheter aortic valve replacement) Echo 01/2022: mild prosthetic AV stenosis is new.  Hypothyroidism TSH WNL Continue home synthroid 152mg daily   Hyperlipidemia Continue pravachol      DVT prophylaxis: enoxaparin (LOVENOX) injection 40 mg Start: 04/02/22 1900    Code Status: Full Code Family Communication:   Disposition Plan:  Level of care: Telemetry Medical Status is: Observation The patient will require care spanning > 2 midnights and should be moved to inpatient because: needs IV diuresis    Consultants:  none   Subjective: Still with SOB  Objective: Vitals:   04/03/22 0400 04/03/22 0500 04/03/22 0600 04/03/22 0937  BP:   (!) 153/68 (!) 135/44  Pulse: 70 71 67 70  Resp: '18 18 17  14  '$ Temp:   98.3 F (36.8 C) 97.9 F (36.6 C)  TempSrc:   Oral Oral  SpO2: 95% 97% 93% 98%   No intake or output data in the 24 hours ending 04/03/22 1009 There were no vitals filed for this visit.  Examination:   General: Appearance:    Obese female in no acute distress     Lungs:     Diminished, on 2L, respirations unlabored  Heart:    Normal heart rate. Normal rhythm.     MS:   All extremities are intact.    Neurologic:   Awake, alert, oriented x 3. No apparent focal neurological           defect.        Data Reviewed: I have personally reviewed following labs and imaging studies  CBC: Recent Labs  Lab 04/02/22 1154 04/03/22 0632  WBC 10.6* 8.8  NEUTROABS 8.0*  --   HGB 8.2* 8.1*  HCT 25.1* 24.9*  MCV 85.4 83.8  PLT 263 2341  Basic Metabolic Panel: Recent Labs  Lab 03/31/22 1539 04/02/22 1154 04/02/22 1830 04/03/22 0632  NA 125* 123*  --  124*  K 3.8 4.1  --  3.8  CL 91* 89*  --  90*  CO2 21 25  --  25  GLUCOSE 92 114*  --  99  BUN 19 16  --  18  CREATININE 0.79 0.80  --  0.94  CALCIUM 8.3* 8.4*  --  8.0*  MG  --   --  1.8  --  GFR: CrCl cannot be calculated (Unknown ideal weight.). Liver Function Tests: No results for input(s): "AST", "ALT", "ALKPHOS", "BILITOT", "PROT", "ALBUMIN" in the last 168 hours. No results for input(s): "LIPASE", "AMYLASE" in the last 168 hours. No results for input(s): "AMMONIA" in the last 168 hours. Coagulation Profile: No results for input(s): "INR", "PROTIME" in the last 168 hours. Cardiac Enzymes: No results for input(s): "CKTOTAL", "CKMB", "CKMBINDEX", "TROPONINI" in the last 168 hours. BNP (last 3 results) No results for input(s): "PROBNP" in the last 8760 hours. HbA1C: No results for input(s): "HGBA1C" in the last 72 hours. CBG: No results for input(s): "GLUCAP" in the last 168 hours. Lipid Profile: No results for input(s): "CHOL", "HDL", "LDLCALC", "TRIG", "CHOLHDL", "LDLDIRECT" in the last 72  hours. Thyroid Function Tests: Recent Labs    04/02/22 1830  TSH 0.891   Anemia Panel: Recent Labs    04/03/22 0632  FERRITIN 67  TIBC 273  IRON 57   Sepsis Labs: No results for input(s): "PROCALCITON", "LATICACIDVEN" in the last 168 hours.  No results found for this or any previous visit (from the past 240 hour(s)).       Radiology Studies: DG Chest 2 View  Result Date: 04/02/2022 CLINICAL DATA:  Triage notes:"Patient complains of shortness of breath and funny feeling in chest x 1 week. Had labs drawn yesterday and was called today and told sodium low and in heart failure. Patient alert and oriented, speaking complete sentences" EXAM: CHEST - 2 VIEW COMPARISON:  11/07/2018. FINDINGS: Cardiac silhouette is mildly enlarged. Stable changes from prior PAVR. No mediastinal or hilar masses or evidence of adenopathy. Lungs are clear.  No pleural effusion or pneumothorax. Previous vertebroplasty at L1, unchanged. IMPRESSION: No acute cardiopulmonary disease. Electronically Signed   By: Lajean Manes M.D.   On: 04/02/2022 12:39        Scheduled Meds:  aspirin  81 mg Oral Daily   carvedilol  12.5 mg Oral BID WC   enoxaparin (LOVENOX) injection  40 mg Subcutaneous Q24H   levothyroxine  100 mcg Oral QAC breakfast   losartan  25 mg Oral Daily   multivitamin  1 tablet Oral BID   pantoprazole  40 mg Oral Daily   polyvinyl alcohol  1 drop Both Eyes QPM   pravastatin  40 mg Oral QPM   sodium chloride flush  3 mL Intravenous Q12H   Continuous Infusions:  sodium chloride       LOS: 0 days    Time spent: 45 minutes spent on chart review, discussion with nursing staff, consultants, updating family and interview/physical exam; more than 50% of that time was spent in counseling and/or coordination of care.    Geradine Girt, DO [facility] Available via Epic secure chat 7am-7pm After these hours, please refer to coverage provider listed on [email or web address] 04/03/2022, 10:09 AM

## 2022-04-03 NOTE — Consult Note (Addendum)
CARDIOLOGY CONSULT NOTE  Patient ID: Zoe [name] MRN: [id number] DOB/AGE: 1934-01-04 88 y.o.  Admit date: 04/02/2022 Referring Physician: [facility] Reason for Consultation:  CHF  HPI:   88 y/o Caucasian female with hypertension, hyperlipidemia, severe AS-now s/p 23 mm Edwards Sapien TAVR (2019), admitted with hyponatremia and acute on chronic HFpEF  Patient has had recent dyspnea with minimal exertion. She was sent to ER after her outpatient labs showed hyponatremia and elevated BNP.  Chest x-ray was clear, BNP was elevated.  Echocardiogram showed normal EF, grade 2 diastolic function.  She was also recently started on hydrochlorothiazide, which is known to contribute to hyponatremia.    Past Medical History:  Diagnosis Date   Arthritis    some - per patient   Breast cancer ([place])    breast cancer / left    Cataract    bilat    GERD (gastroesophageal reflux disease)    History of kidney stones    Hyperlipidemia    Hypertension    Hypothyroidism    Macular degeneration    Left   S/P TAVR (transcatheter aortic valve replacement) 09/03/2018   23 mm Edwards Sapien 3 transcatheter heart valve placed via percutaneous right transfemoral approach    Severe aortic stenosis    Stress incontinence    Thyroid disease    Tinnitus      Past Surgical History:  Procedure Laterality Date   ABDOMINAL HYSTERECTOMY  1970's   BACK SURGERY     BREAST LUMPECTOMY  12/1998   lumpectomy   CARDIAC CATHETERIZATION     EYE SURGERY     cataract surgery bilat    INTRAOPERATIVE TRANSTHORACIC ECHOCARDIOGRAM N/A 09/03/2018   Procedure: INTRAOPERATIVE TRANSTHORACIC ECHOCARDIOGRAM;  Surgeon: Burnell Blanks, MD;  Location: [facility];  Service: Open Heart Surgery;  Laterality: N/A;   LITHOTRIPSY     Right total knee     2018 Dr. Alvan Dame   RIGHT/LEFT HEART CATH AND CORONARY ANGIOGRAPHY N/A 08/06/2018   Procedure: RIGHT/LEFT HEART CATH AND CORONARY ANGIOGRAPHY;  Surgeon: Adrian Prows, MD;   Location: [facility];  Service: Cardiovascular;  Laterality: N/A;   THYROIDECTOMY, PARTIAL  1975   TONSILLECTOMY     as a child - patient not sure of exact date   TOTAL KNEE ARTHROPLASTY Left 03/13/2016   Procedure: TOTAL KNEE ARTHROPLASTY;  Surgeon: Paralee Cancel, MD;  Location: [facility];  Service: Orthopedics;  Laterality: Left;   TOTAL KNEE ARTHROPLASTY Right 06/18/2017   Procedure: RIGHT TOTAL KNEE ARTHROPLASTY;  Surgeon: Paralee Cancel, MD;  Location: [facility];  Service: Orthopedics;  Laterality: Right;   TRANSCATHETER AORTIC VALVE REPLACEMENT, TRANSFEMORAL N/A 09/03/2018   Procedure: TRANSCATHETER AORTIC VALVE REPLACEMENT, TRANSFEMORAL;  Surgeon: Burnell Blanks, MD;  Location: [facility];  Service: Open Heart Surgery;  Laterality: N/A;      Family History  Problem Relation Age of Onset   Diabetes Mother    Stroke Mother        Carotid artery disease   Heart disease Father        CAD   Coronary artery disease Father    Diabetes Sister      Social History: Social History   Socioeconomic History   Marital status: Married    Spouse name: Not on file   Number of children: 4   Years of education: Not on file   Highest education level: Master's degree (e.g., MA, MS, MEng, MEd, MSW, MBA)  Occupational History   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  Tobacco Use   Smoking status: Never   Smokeless tobacco: Never  Vaping Use   Vaping Use: Never used  Substance and Sexual Activity   Alcohol use: No   Drug use: No   Sexual activity: Not Currently  Other Topics Concern   Not on file  Social History Narrative   Not on file   Social Determinants of Health   Financial Resource Strain: Low Risk  (11/12/2018)   Overall Financial Resource Strain (CARDIA)    Difficulty of Paying Living Expenses: Not very hard  Food Insecurity: No Food Insecurity (11/12/2018)   Hunger Vital Sign    Worried About Running Out of Food in the Last Year: Never true     Ran Out of Food in the Last Year: Never true  Transportation Needs: No Transportation Needs (11/12/2018)   PRAPARE - Hydrologist (Medical): No    Lack of Transportation (Non-Medical): No  Physical Activity: Inactive (11/12/2018)   Exercise Vital Sign    Days of Exercise per Week: 0 days    Minutes of Exercise per Session: 0 min  Stress: Stress Concern Present (11/12/2018)   [place]    Feeling of Stress : To some extent  Social Connections: Not on file  Intimate Partner Violence: Not on file     Medications Prior to Admission  Medication Sig Dispense Refill Last Dose   Artificial Tear Solution (SOOTHE XP) SOLN Place 1 drop into both eyes every evening.   04/01/2022   aspirin 81 MG chewable tablet Chew 1 tablet (81 mg total) by mouth daily. (Patient taking differently: Chew 81 mg by mouth every evening.)   04/01/2022   carvedilol (COREG) 12.5 MG tablet Take 12.5 mg by mouth 2 (two) times daily with a meal.   04/02/2022 at 0800   Cholecalciferol (VITAMIN D3) 2000 units TABS Take 2,000 Units by mouth daily.   04/02/2022   esomeprazole (NEXIUM) 20 MG capsule Take 20 mg by mouth daily.   04/02/2022   ferrous sulfate 325 (65 FE) MG tablet Take 325 mg by mouth every other day.   04/01/2022   hydrochlorothiazide (MICROZIDE) 12.5 MG capsule Take 25 mg by mouth daily.   04/02/2022   levothyroxine (SYNTHROID, LEVOTHROID) 100 MCG tablet Take 100 mcg by mouth daily before breakfast.  2 04/02/2022   Multiple Vitamins-Minerals (PRESERVISION AREDS 2) CAPS Take 1 capsule by mouth 2 (two) times daily.   04/02/2022   pravastatin (PRAVACHOL) 40 MG tablet Take 40 mg by mouth every evening.   04/01/2022   valsartan (DIOVAN) 320 MG tablet Take 320 mg by mouth daily.   04/02/2022   ciprofloxacin (CILOXAN) 0.3 % ophthalmic solution SMARTSIG:In Eye(s) (Patient not taking: Reported on 04/02/2022)   Completed Course   losartan  (COZAAR) 25 MG tablet Take 25 mg by mouth daily. (Patient not taking: Reported on 04/02/2022)   Not Taking    Review of Systems  Cardiovascular:  Positive for dyspnea on exertion and leg swelling. Negative for chest pain, palpitations and syncope.      Physical Exam: Physical Exam Vitals and nursing note reviewed.  Constitutional:      General: She is not in acute distress. Neck:     Vascular: No JVD.  Cardiovascular:     Rate and Rhythm: Normal rate and regular rhythm.     Heart sounds: Normal heart sounds. No murmur heard. Pulmonary:  Effort: Pulmonary effort is normal.     Breath sounds: Normal breath sounds. No wheezing or rales.  Musculoskeletal:     Right lower leg: Edema (Trace) present.     Left lower leg: Edema (Trace) present.        Imaging/tests reviewed and independently interpreted: Lab Results: CBC, BMP, BNP  Cardiac Studies:  Telemetry 04/03/2022: No arrhythmia  EKG 04/02/2022: Sinus rhythm Normal EKG  Echocardiogram 04/03/2022:  1. Left ventricular ejection fraction, by estimation, is 60 to 65%. The  left ventricle has normal function. The left ventricle has no regional  wall motion abnormalities. There is moderate left ventricular hypertrophy.  Left ventricular diastolic  parameters are consistent with Grade II diastolic dysfunction  (pseudonormalization).   2. Right ventricular systolic function is normal. The right ventricular  size is normal. Tricuspid regurgitation signal is inadequate for assessing  PA pressure.   3. Left atrial size was moderately dilated.   4. The mitral valve is degenerative. Trivial mitral valve regurgitation.  Moderate mitral annular calcification.   5. Aortic valve regurgitation is not visualized. Procedure Date:  09/03/2018.   6. Aortic no signficant ascending aortic aneurysm.   7. The inferior vena cava is normal in size with greater than 50%  respiratory variability, suggesting right atrial pressure of 3 mmHg.     Assessment & Recommendations:  88 y/o Caucasian female with hypertension, hyperlipidemia, severe AS-now s/p 23 mm Edwards Sapien TAVR (2019), admitted with hyponatremia and acute on chronic HFpEF  HFpEF: Likely acute on chronic Grade 2 diastolic dysfunction on echocardiogram.  Elevated BNP. Physical exam shows only mild volume overload.  Urine output likely not accurately measured as patient has some bladder prolapse.   Agree with IV Lasix 10 mg twice daily. She would likely never only 1-2 more doses of IV Lasix. Recommend starting Farxiga 10 mg daily. She may be able to go home on p.o. Farxiga 10 mg, and furosemide 20 mg daily, tomorrow, as long as she does not require any oxygen on ambulation. Recommend increasing losartan to 50 mg daily to achieve better blood pressure control. Would like to use spironolactone, but unable to use in setting of hyponatremia. Recommend 2 g sodium restriction diet.  Hyponatremia: Likely dilutional hyponatremia, as well as contribution from hydrochlorothiazide. Hydrochlorothiazide discontinued.  Continue diuresis as above.  Cardiology will sign off.  Will arrange outpatient follow-up.  Discussed interpretation of tests and management recommendations with the primary team     Nigel Mormon, MD Pager: [phone] Office: [phone]

## 2022-04-03 NOTE — Progress Notes (Signed)
Mobility Specialist: Progress Note   04/03/22 1640  Mobility  Activity Ambulated with assistance in hallway  Level of Assistance Contact guard assist, steadying assist  Assistive Device Front wheel walker  Distance Ambulated (ft) 240 ft  Activity Response Tolerated well  $Mobility charge 1 Mobility   Pre-Mobility: 70 HR Post-Mobility: 70 HR, 99% SpO2  Pt received in the bed and agreeable to ambulation. Ambulated on 2 L/min [place]. C/o bilateral knee pain during session, otherwise asymptomatic. Pt sitting EOB after session with call bell and phone at her side. Family present in the room.   [facility] [name] Mobility Specialist Mobility Specialist [address]: [phone]

## 2022-04-04 DIAGNOSIS — I5033 Acute on chronic diastolic (congestive) heart failure: Secondary | ICD-10-CM | POA: Diagnosis not present

## 2022-04-04 LAB — CORTISOL: Cortisol, Plasma: 19.2 ug/dL

## 2022-04-04 LAB — BASIC METABOLIC PANEL
Anion gap: 14 (ref 5–15)
BUN: 23 mg/dL (ref 8–23)
CO2: 26 mmol/L (ref 22–32)
Calcium: 9 mg/dL (ref 8.9–10.3)
Chloride: 85 mmol/L — ABNORMAL LOW (ref 98–111)
Creatinine, Ser: 0.93 mg/dL (ref 0.44–1.00)
GFR, Estimated: 60 mL/min — ABNORMAL LOW (ref >60)
Glucose, Bld: 116 mg/dL — ABNORMAL HIGH (ref 70–99)
Potassium: 3.6 mmol/L (ref 3.5–5.1)
Sodium: 125 mmol/L — ABNORMAL LOW (ref 135–145)

## 2022-04-04 MED ORDER — DAPAGLIFLOZIN PROPANEDIOL 10 MG PO TABS
10.0000 mg | ORAL_TABLET | Freq: Every day | ORAL | Status: DC
Start: 2022-04-04 — End: 2022-04-07
  Administered 2022-04-04 – 2022-04-07 (×4): 10 mg via ORAL
  Filled 2022-04-04 (×4): qty 1

## 2022-04-04 MED ORDER — FUROSEMIDE 10 MG/ML IJ SOLN
20.0000 mg | Freq: Two times a day (BID) | INTRAMUSCULAR | Status: DC
  Administered 2022-04-04 – 2022-04-05 (×3): 20 mg via INTRAVENOUS
  Filled 2022-04-04 (×3): qty 2

## 2022-04-04 MED ORDER — LOSARTAN POTASSIUM 50 MG PO TABS
50.0000 mg | ORAL_TABLET | Freq: Every day | ORAL | Status: DC
  Administered 2022-04-05: 50 mg via ORAL
  Filled 2022-04-04: qty 1

## 2022-04-04 MED ORDER — LOSARTAN POTASSIUM 25 MG PO TABS
25.0000 mg | ORAL_TABLET | Freq: Once | ORAL | Status: AC
Start: 2022-04-04 — End: 2022-04-04
  Administered 2022-04-04: 25 mg via ORAL
  Filled 2022-04-04: qty 1

## 2022-04-04 NOTE — Progress Notes (Signed)
Mobility Specialist Progress Note:   04/04/22 1130  Mobility  Activity Ambulated with assistance in hallway  Level of Assistance Standby assist, set-up cues, supervision of patient - no hands on  Assistive Device Front wheel walker  Distance Ambulated (ft) 280 ft  Activity Response Tolerated well  $Mobility charge 1 Mobility   Pt received in bed willing to participate in mobility. No complaints of pain. Left EOB with call bell in reach and all needs met.   [facility] Nyia Tsao Mobility Specialist

## 2022-04-04 NOTE — Progress Notes (Signed)
PROGRESS NOTE    RILEIGH KAWASHIMA  BPZ:[id number] DOB: 1934/07/12 DOA: 04/02/2022 PCP: [facility]   Brief Narrative: 88 year old with past medical history significant for breast cancer, GERD, hypertension, hyperlipidemia, hypothyroidism, aortic stenosis status post TAVR who presents to the ED complaining of worsening shortness of breath on exertion for 1 week.  She presented to the ED as recommended by her cardiologist for diuresis/   Patient was found to have hyponatremia as well sodium down to 125.     Assessment & Plan:   Principal Problem:   Acute exacerbation of CHF (congestive heart failure) (HCC) Active Problems:   Hyponatremia   Microcytic anemia   Hypertension   S/P TAVR (transcatheter aortic valve replacement)   Hypothyroidism   Hyperlipidemia   CHF (congestive heart failure) (HCC)  1-Acute on Chronic Diastolic Heart Failure Exacerbation:  -Echo 6/12: Ejection fraction 60 to 24%, grade 2 diastolic dysfunction, trivial mitral valve regurgitation.  -She is still on 1-2 L oxygen.  -Plan to continue with IV lasix.  -Start farxiga.  -Cardiology recommend to increase Cozaar.  -Appreciate cardiology assistance.  Plan to continue with IV lasix.   2-Hyponatremia:  In  setting of HCTZ use and Hypervolemia.  Urine osmolality 275.  Monitor on lasix and Farxiga.  NA; 125--123--124-125 Check cortisol level. 19 TSH; 0.8  3-Microcytic anemia: Monitor hemoglobin.  Hemoglobin baseline 9.5--- 10.4  Hypertension: Continue with Coreg, Cozaar increased to 50 mg  Status post TAVR: Echo 01/2022 bioprosthetic 8 be a stenosis  Hypothyroidism: Continue with Synthroid        Estimated body mass index is 30.45 kg/m as calculated from the following:   Height as of this encounter: '5\' 3"'$  (1.6 m).   Weight as of this encounter: 78 kg.   DVT prophylaxis: LOvenox Code Status: Full code Family Communication: Care discussed with patient.  Disposition  Plan:  Status is: Inpatient Remains inpatient appropriate because: management of HF and hyponatremia.     Consultants:  Cardiology, [place].  \ Procedures:  ECHO; Diastolic Dysfunction grade 2.   Antimicrobials:    Subjective: She hasn't been walking a lot. She was able to ambulate some yesterday and did ok.    Objective: Vitals:   04/04/22 0025 04/04/22 0031 04/04/22 0443 04/04/22 0742  BP:  137/63 121/77 139/67  Pulse:  72 71 71  Resp:  '20 18 20  '$ Temp:  (!) 97.5 F (36.4 C) 97.6 F (36.4 C) 98.1 F (36.7 C)  TempSrc:  Oral Oral Oral  SpO2:  98% 96% 94%  Weight: 78 kg     Height:        Intake/Output Summary (Last 24 hours) at 04/04/2022 1026 Last data filed at 04/04/2022 0945 Gross per 24 hour  Intake 953 ml  Output 1600 ml  Net -647 ml   Filed Weights   04/03/22 1100 04/03/22 1144 04/04/22 0025  Weight: 79.6 kg 79.6 kg 78 kg    Examination:  General exam: Appears calm and comfortable  Respiratory system: BL crackles.  Cardiovascular system: S1 & S2 heard, RRR.  Gastrointestinal system: Abdomen is nondistended, soft and nontender. No organomegaly or masses felt. Normal bowel sounds heard. Central nervous system: Alert and oriented. No focal neurological deficits. Extremities: Symmetric 5 x 5 power. ight appear normal. Mood & affect appropriate.     Data Reviewed: I have personally reviewed following labs and imaging studies  CBC: Recent Labs  Lab 04/02/22 1154 04/03/22 0632  WBC 10.6* 8.8  NEUTROABS 8.0*  --  HGB 8.2* 8.1*  HCT 25.1* 24.9*  MCV 85.4 83.8  PLT 263 283   Basic Metabolic Panel: Recent Labs  Lab 03/31/22 1539 04/02/22 1154 04/02/22 1830 04/03/22 0632 04/04/22 0640  NA 125* 123*  --  124* 125*  K 3.8 4.1  --  3.8 3.6  CL 91* 89*  --  90* 85*  CO2 21 25  --  25 26  GLUCOSE 92 114*  --  99 116*  BUN 19 16  --  18 23  CREATININE 0.79 0.80  --  0.94 0.93  CALCIUM 8.3* 8.4*  --  8.0* 9.0  MG  --   --  1.8  --   --     GFR: Estimated Creatinine Clearance: 42.9 mL/min (by C-G formula based on SCr of 0.93 mg/dL). Liver Function Tests: No results for input(s): "AST", "ALT", "ALKPHOS", "BILITOT", "PROT", "ALBUMIN" in the last 168 hours. No results for input(s): "LIPASE", "AMYLASE" in the last 168 hours. No results for input(s): "AMMONIA" in the last 168 hours. Coagulation Profile: No results for input(s): "INR", "PROTIME" in the last 168 hours. Cardiac Enzymes: No results for input(s): "CKTOTAL", "CKMB", "CKMBINDEX", "TROPONINI" in the last 168 hours. BNP (last 3 results) No results for input(s): "PROBNP" in the last 8760 hours. HbA1C: No results for input(s): "HGBA1C" in the last 72 hours. CBG: No results for input(s): "GLUCAP" in the last 168 hours. Lipid Profile: No results for input(s): "CHOL", "HDL", "LDLCALC", "TRIG", "CHOLHDL", "LDLDIRECT" in the last 72 hours. Thyroid Function Tests: Recent Labs    04/02/22 1830  TSH 0.891   Anemia Panel: Recent Labs    04/03/22 0632  FERRITIN 67  TIBC 273  IRON 57   Sepsis Labs: No results for input(s): "PROCALCITON", "LATICACIDVEN" in the last 168 hours.  No results found for this or any previous visit (from the past 240 hour(s)).       Radiology Studies: ECHOCARDIOGRAM COMPLETE  Result Date: 04/03/2022    ECHOCARDIOGRAM REPORT   Patient Name:   NABILA ALBARRACIN Date of Exam: 04/03/2022 Medical Rec #:  [id number]           Height:       63.0 in Accession #:    [id number]          Weight:       179.0 lb Date of Birth:  [birth year]            BSA:          1.845 m Patient Age:    22 years            BP:           140/129 mmHg Patient Gender: F                   HR:           73 bpm. Exam Location:  Inpatient Procedure: 2D Echo, Cardiac Doppler and Color Doppler Indications:    Congestive heart failure  History:        Patient has prior history of Echocardiogram examinations, most                 recent 02/19/2022. Aortic Valve Disease,  Signs/Symptoms:Shortness                 of Breath and Dyspnea; Risk Factors:Hypertension and HLD.                 History of breast cancer.  Sonographer:  Joette Catching RCS Referring Phys: [id number] [place]  1. Left ventricular ejection fraction, by estimation, is 60 to 65%. The left ventricle has normal function. The left ventricle has no regional wall motion abnormalities. There is moderate left ventricular hypertrophy. Left ventricular diastolic parameters are consistent with Grade II diastolic dysfunction (pseudonormalization).  2. Right ventricular systolic function is normal. The right ventricular size is normal. Tricuspid regurgitation signal is inadequate for assessing PA pressure.  3. Left atrial size was moderately dilated.  4. The mitral valve is degenerative. Trivial mitral valve regurgitation. Moderate mitral annular calcification.  5. Aortic valve regurgitation is not visualized. Procedure Date: 09/03/2018.  6. Aortic no signficant ascending aortic aneurysm.  7. The inferior vena cava is normal in size with greater than 50% respiratory variability, suggesting right atrial pressure of 3 mmHg. Comparison(s): No significant change from prior study. FINDINGS  Left Ventricle: Left ventricular ejection fraction, by estimation, is 60 to 65%. The left ventricle has normal function. The left ventricle has no regional wall motion abnormalities. The left ventricular internal cavity size was normal in size. There is  moderate left ventricular hypertrophy. Left ventricular diastolic parameters are consistent with Grade II diastolic dysfunction (pseudonormalization). Right Ventricle: The right ventricular size is normal. Right ventricular systolic function is normal. Tricuspid regurgitation signal is inadequate for assessing PA pressure. Left Atrium: Left atrial size was moderately dilated. Right Atrium: Right atrial size was normal in size. Pericardium: Trivial pericardial effusion is present.  Mitral Valve: The mitral valve is degenerative in appearance. There is moderate calcification of the mitral valve leaflet(s). Moderate mitral annular calcification. Trivial mitral valve regurgitation. Tricuspid Valve: Tricuspid valve regurgitation is trivial. Aortic Valve: Aortic prosthesis is well seated. No paravalvular leak. DI 0.36. AV peak velocity 2.8 m/s. No significant changes from prior echo. Aortic valve regurgitation is not visualized. Aortic valve mean gradient measures 18.3 mmHg. Aortic valve peak gradient measures 32.2 mmHg. Aortic valve area, by VTI measures 1.13 cm. There is a 23 mm Edwards Sapien prosthetic, stented (TAVR) valve present in the aortic position. Pulmonic Valve: Pulmonic valve regurgitation is mild. Aorta: No signficant ascending aortic aneurysm. Venous: The inferior vena cava is normal in size with greater than 50% respiratory variability, suggesting right atrial pressure of 3 mmHg. IAS/Shunts: No atrial level shunt detected by color flow Doppler.  LEFT VENTRICLE PLAX 2D LVIDd:         4.30 cm   Diastology LVIDs:         3.10 cm   LV e' medial:    5.66 cm/s LV PW:         0.70 cm   LV E/e' medial:  30.9 LV IVS:        1.70 cm   LV e' lateral:   9.03 cm/s LVOT diam:     2.00 cm   LV E/e' lateral: 19.4 LV SV:         80 LV SV Index:   44 LVOT Area:     3.14 cm  RIGHT VENTRICLE             IVC RV Basal diam:  3.50 cm     IVC diam: 1.90 cm RV Mid diam:    1.80 cm RV S prime:     12.40 cm/s TAPSE (M-mode): 2.2 cm LEFT ATRIUM             Index        RIGHT ATRIUM  Index LA diam:        5.00 cm 2.71 cm/m   RA Area:     18.20 cm LA Vol (A2C):   73.7 ml 39.95 ml/m  RA Volume:   48.30 ml  26.18 ml/m LA Vol (A4C):   90.7 ml 49.17 ml/m LA Biplane Vol: 84.6 ml 45.86 ml/m  AORTIC VALVE                     PULMONIC VALVE AV Area (Vmax):    1.05 cm      PV Vmax:          1.33 m/s AV Area (Vmean):   1.09 cm      PV Peak grad:     7.1 mmHg AV Area (VTI):     1.13 cm      PR End  Diast Vel: 9.86 msec AV Vmax:           283.67 cm/s AV Vmean:          204.000 cm/s AV VTI:            0.713 m AV Peak Grad:      32.2 mmHg AV Mean Grad:      18.3 mmHg LVOT Vmax:         95.10 cm/s LVOT Vmean:        71.000 cm/s LVOT VTI:          0.256 m LVOT/AV VTI ratio: 0.36  AORTA Ao Root diam: 2.80 cm Ao Asc diam:  3.40 cm MITRAL VALVE MV Area (PHT): 4.86 cm     SHUNTS MV Decel Time: 156 msec     Systemic VTI:  0.26 m MV E velocity: 175.00 cm/s  Systemic Diam: 2.00 cm MV A velocity: 98.10 cm/s MV E/A ratio:  1.78 Mary Scientist, physiological signed by Phineas Inches Signature Date/Time: 04/03/2022/10:34:59 AM    Final    DG Chest 2 View  Result Date: 04/02/2022 CLINICAL DATA:  Triage notes:"Patient complains of shortness of breath and funny feeling in chest x 1 week. Had labs drawn yesterday and was called today and told sodium low and in heart failure. Patient alert and oriented, speaking complete sentences" EXAM: CHEST - 2 VIEW COMPARISON:  11/07/2018. FINDINGS: Cardiac silhouette is mildly enlarged. Stable changes from prior PAVR. No mediastinal or hilar masses or evidence of adenopathy. Lungs are clear.  No pleural effusion or pneumothorax. Previous vertebroplasty at L1, unchanged. IMPRESSION: No acute cardiopulmonary disease. Electronically Signed   By: Lajean Manes M.D.   On: 04/02/2022 12:39        Scheduled Meds:  aspirin  81 mg Oral Daily   carvedilol  12.5 mg Oral BID WC   dapagliflozin propanediol  10 mg Oral Daily   enoxaparin (LOVENOX) injection  40 mg Subcutaneous Q24H   furosemide  20 mg Intravenous BID   levothyroxine  100 mcg Oral QAC breakfast   [START ON 04/05/2022] losartan  50 mg Oral Daily   multivitamin  1 tablet Oral BID   pantoprazole  40 mg Oral Daily   polyvinyl alcohol  1 drop Both Eyes QPM   pravastatin  40 mg Oral QPM   sodium chloride flush  3 mL Intravenous Q12H   Continuous Infusions:  sodium chloride       LOS: 1 day    Time spent: 35 minutes.      Elmarie Shiley, MD [facility]   If 7PM-7AM, please contact night-coverage [email or web address]  04/04/2022, 10:26 AM

## 2022-04-04 NOTE — Progress Notes (Signed)
SATURATION QUALIFICATIONS: (This note is used to comply with regulatory documentation for home oxygen)  Patient Saturations on Room Air at Rest = 99%  Patient Saturations on Room Air while Ambulating = 92%  Patient Saturations on n/a Liters of oxygen while Ambulating = n/a%

## 2022-04-04 NOTE — Progress Notes (Signed)
Subjective:  Feels well  Still requiring 1 L O2  Objective:  Vital Signs in the last 24 hours: Temp:  [97.5 F (36.4 C)-98.3 F (36.8 C)] 97.6 F (36.4 C) (06/13 1042) Pulse Rate:  [66-73] 68 (06/13 1042) Resp:  [18-20] 18 (06/13 1042) BP: (121-158)/(57-93) 137/57 (06/13 1042) SpO2:  [88 %-99 %] 95 % (06/13 1042) Weight:  [78 kg-79.6 kg] 78 kg (06/13 0025)  Intake/Output from previous day: 06/12 0701 - 06/13 0700 In: 726 [P.O.:713] Out: 1450 [Urine:1450]  Physical Exam Vitals and nursing note reviewed.  Constitutional:      General: She is not in acute distress. Neck:     Vascular: No JVD.  Cardiovascular:     Rate and Rhythm: Normal rate and regular rhythm.     Heart sounds: Normal heart sounds. No murmur heard. Pulmonary:     Effort: Pulmonary effort is normal.     Breath sounds: Normal breath sounds. No wheezing or rales.  Musculoskeletal:     Right lower leg: Edema (Trace) present.     Left lower leg: Edema (Trace) present.     Imaging/tests reviewed and independently interpreted:  Cardiac Studies:  Telemetry 04/04/2022: No significant arrhythmia  EKG 04/02/2022: Sinus rhythm Normal EKG   Echocardiogram 04/03/2022:  1. Left ventricular ejection fraction, by estimation, is 60 to 65%. The  left ventricle has normal function. The left ventricle has no regional  wall motion abnormalities. There is moderate left ventricular hypertrophy.  Left ventricular diastolic  parameters are consistent with Grade II diastolic dysfunction  (pseudonormalization).   2. Right ventricular systolic function is normal. The right ventricular  size is normal. Tricuspid regurgitation signal is inadequate for assessing  PA pressure.   3. Left atrial size was moderately dilated.   4. The mitral valve is degenerative. Trivial mitral valve regurgitation.  Moderate mitral annular calcification.   5. Aortic valve regurgitation is not visualized. Procedure Date:  09/03/2018.   6.  Aortic no signficant ascending aortic aneurysm.   7. The inferior vena cava is normal in size with greater than 50%  respiratory variability, suggesting right atrial pressure of 3 mmHg.   Assessment & Recommendations:  88 y/o Caucasian female with hypertension, hyperlipidemia, severe AS-now s/p 23 mm Edwards Sapien TAVR (2019), admitted with hyponatremia and acute on chronic HFpEF   HFpEF: Likely acute on chronic Grade 2 diastolic dysfunction on echocardiogram.  Elevated BNP. Still requiring 1 L O2 Agree with one more day of IV Lasix 20 mg twice daily. Recommend starting Farxiga 10 mg daily, losartan 50 mg daily. Would like to use spironolactone, but unable to use in setting of hyponatremia. Recommend 2 g sodium restriction diet.   Hyponatremia: Likely dilutional hyponatremia, as well as contribution from hydrochlorothiazide. Hydrochlorothiazide discontinued.  Continue diuresis as above.   Outpatient f/u 6/22. Will check BMP before then.    Discussed interpretation of tests and management recommendations with the primary team   Nigel Mormon, MD Pager: [phone] Office: [phone]

## 2022-04-04 NOTE — TOC Progression Note (Addendum)
Transition of Care [facility]) - Progression Note    Patient Details  Name: Zoe [name] MRN: [id number] Date of Birth: [birth year]  [facility] CM/SW Contact  Zenon Mayo, RN Phone Number: 04/04/2022, 3:09 PM  Clinical Narrative:    From home with spouse, pretty indep at home. She has no DME at home. She uses a cane. Has PCP- Janie Morning at [facility], she goes to [facility] on [address]. She still drives, she has no issues with her medications taking or affording them.  Her support system is her daughter n law, her daughter and her spouse.  She states her scale is broken so she has not been weighing her self, she has a bp cuff but has not been checking her bp , she eats a low sodium diet but thinks she needs to start eating a no salt diet.  She states her DIL will transport her home at dc.  She also will purchase another scale and will start checking her bp more often.  She is here with CHF , conts on IV lasix, plan is to possible dc tomorrow.  TOC will continue to follow for dc needs.  She would like her medications to go to [facility].        Expected Discharge Plan and Services                                                 Social Determinants of Health (SDOH) Interventions    Readmission Risk Interventions     No data to display

## 2022-04-04 NOTE — Plan of Care (Signed)
  Problem: Education: Goal: Ability to demonstrate management of disease process will improve Outcome: Progressing   Problem: Clinical Measurements: Goal: Respiratory complications will improve Outcome: Progressing   Problem: Pain Managment: Goal: General experience of comfort will improve Outcome: Progressing

## 2022-04-04 NOTE — Progress Notes (Signed)
Pt's O2 sat remained 88-92% after pt weaned to room air. Pt placed back on 1L [place].

## 2022-04-05 DIAGNOSIS — I5031 Acute diastolic (congestive) heart failure: Secondary | ICD-10-CM | POA: Diagnosis not present

## 2022-04-05 LAB — BASIC METABOLIC PANEL
Anion gap: 10 (ref 5–15)
BUN: 35 mg/dL — ABNORMAL HIGH (ref 8–23)
CO2: 26 mmol/L (ref 22–32)
Calcium: 8.3 mg/dL — ABNORMAL LOW (ref 8.9–10.3)
Chloride: 88 mmol/L — ABNORMAL LOW (ref 98–111)
Creatinine, Ser: 1.31 mg/dL — ABNORMAL HIGH (ref 0.44–1.00)
GFR, Estimated: 40 mL/min — ABNORMAL LOW (ref >60)
Glucose, Bld: 111 mg/dL — ABNORMAL HIGH (ref 70–99)
Potassium: 3.2 mmol/L — ABNORMAL LOW (ref 3.5–5.1)
Sodium: 124 mmol/L — ABNORMAL LOW (ref 135–145)

## 2022-04-05 LAB — CBC
HCT: 26.2 % — ABNORMAL LOW (ref 36.0–46.0)
Hemoglobin: 8.4 g/dL — ABNORMAL LOW (ref 12.0–15.0)
MCH: 27 pg (ref 26.0–34.0)
MCHC: 32.1 g/dL (ref 30.0–36.0)
MCV: 84.2 fL (ref 80.0–100.0)
Platelets: 228 10*3/uL (ref 150–400)
RBC: 3.11 MIL/uL — ABNORMAL LOW (ref 3.87–5.11)
RDW: 20.6 % — ABNORMAL HIGH (ref 11.5–15.5)
WBC: 7.9 10*3/uL (ref 4.0–10.5)
nRBC: 0 % (ref 0.0–0.2)

## 2022-04-05 MED ORDER — POTASSIUM CHLORIDE CRYS ER 20 MEQ PO TBCR
40.0000 meq | EXTENDED_RELEASE_TABLET | Freq: Once | ORAL | Status: AC
Start: 2022-04-05 — End: 2022-04-05
  Administered 2022-04-05: 40 meq via ORAL
  Filled 2022-04-05: qty 2

## 2022-04-05 NOTE — Progress Notes (Addendum)
Progress Note   Patient: Zoe [name] QPY:[id number] DOB: Jun 08, 1934 DOA: 04/02/2022     2 DOS: the patient was seen and examined on 04/05/2022   Brief hospital course: 88 year old with past medical history significant for breast cancer, GERD, hypertension, hyperlipidemia, hypothyroidism, aortic stenosis status post TAVR who presents to the ED complaining of worsening shortness of breath on exertion for 1 week.  She presented to the ED as recommended by her cardiologist for diuresis/    Patient was found to have hyponatremia as well sodium down to 125.  Assessment and Plan: * Acute exacerbation of CHF (congestive heart failure) ([place]) -started on low dose IV lasix at '20mg'$  and has diuresed 1 L since admission.  Weight is down from 175.5 pounds to 171 point -strict I/O and daily weights -recent echo in 4/23, echo this admission reveals preserved LVEF with grade 2 diastolic dysfunction and moderate LVH as well as moderate dilatation of the LA. -continue ARB, beta blocker -Stable on room air  Acute kidney injury -Baseline renal function with a creatinine of 0.94 and normal BUN -Renal function on 6/14 BUN 35 and creatinine 1.31 -Labs in a.m. -Suspect secondary to overdiuresis along with coadministration of ARB -Lasix and ARB discontinued noting currently has suboptimal blood pressure readings and this also can contribute to low perfusion state and worsen renal function  Microcytic anemia Labs from 2 years ago show hgb of 9.5-10.4 No signs or symptoms of bleeding  Anemia panel within normal limit  Hyponatremia Suspect secondary to recent hydrochlorothiazide since quickly recovered from hypoxemia and heart failure symptoms and only diuresed 1 L Discussed with patient need to limit free water and likely can at least have normal intake of sodium Hold hctz TSH normal, osmolality 269, urine specific gravity 1.005, urine osmolality 278 and urine sodium 112, cortisol  normal  Hyperlipidemia Continue pravachol   Hypertension Continue to hold HCTZ Effectively diuresed BP somewhat suboptimal with most recent systolic reading 124 with a diastolic blood pressure 45 Continue coreg  AKI we will hold/discontinue ARB  Hypothyroidism TSH normal Continue home synthroid 149mg daily   S/P TAVR (transcatheter aortic valve replacement) Echo 01/2022: mild prosthetic AV stenosis is new.        Subjective: Patient had multiple questions regarding rationale for continuing current hospitalization based on slight worsening of renal function.  She also had questions regarding her diet, and explanation of echocardiogram results including physiology and how this is contributing to her medical problems.  We also discussed need for water restriction but okay to utilize sodium since it is suspected that her hyponatremia is primarily from medications and not from volume overload.  Physical Exam: Vitals:   04/04/22 2349 04/05/22 0343 04/05/22 0644 04/05/22 1427  BP: (!) 116/57 (!) 123/48  (!) 101/45  Pulse: 73 67  75  Resp: 19   18  Temp: 98.1 F (36.7 C) 97.7 F (36.5 C)  98 F (36.7 C)  TempSrc: Oral Oral  Oral  SpO2: 99% 98% 94% 97%  Weight:  77.7 kg    Height:       General exam: Appears calm and comfortable  Respiratory system: Anterior lung sounds clear to auscultation, stable on room air Cardiovascular system: S1 & S2 heard, RRR.  Blood pressure somewhat soft, no peripheral edema. Gastrointestinal system: Abdomen is nondistended, soft and nontender. No organomegaly or masses felt. Normal bowel sounds heard. Central nervous system: Alert and oriented. No focal neurological deficits. Extremities: Symmetrical in appearance, no joint edema or effusions.  Data Reviewed:   Sodium 124, potassium 3.2, chloride 88, BUN 35, creatinine 1.31 which is an increase from 0.93, GFR 40 which is a decrease from 60  Family Communication: Patient  only  Disposition: Status is: Inpatient Remains inpatient appropriate because: Needs continuing monitoring given change in renal function requiring cessation of several medications  Planned Discharge Destination: Home    Time spent: Greater than 30 minutes   Author: Erin Hearing, NP 04/05/2022 3:13 PM  For on call review [email or web address].   The patient was seen and examined independently, labs medical record was reviewed Agree with current assessment and plan.  Plan of care discussed with NP.  SIGNED: Deatra James, MD, FHM. [facility],  Pager (please use [email or web address] to page/text)  Please use Epic Secure Chat for non-urgent communication (7AM-7PM) If 7PM-7AM, please contact night-coverage [email or web address],  04/05/2022, 3:35 PM

## 2022-04-05 NOTE — Evaluation (Signed)
Physical Therapy Evaluation Patient Details Name: Zoe [name] MRN: [id number] DOB: Nov 23, 1933 Today's Date: 04/05/2022  History of Present Illness  Pt is an 88 y/o female admitted secondary to worsening SOB. Thought to be secondary to CHF exacerbation. PMH includes breast cancer, HTN, and AS s/p TAVR.  Clinical Impression  Pt admitted secondary to problem above with deficits below. Pt requiring supervision for mobility tasks this session. Reports feeling more comfortable with use of RW during mobility given weakness. Reports she would like to have HHPT come at d/c. All further needs to be deferred to HHPT at d/c. Will defer further acute mobility needs to mobility specialist team. Will sign off. If needs change, please re-consult.        Recommendations for follow up therapy are one component of a multi-disciplinary discharge planning process, led by the attending physician.  Recommendations may be updated based on patient status, additional functional criteria and insurance authorization.  Follow Up Recommendations Home health PT    Assistance Recommended at Discharge Intermittent Supervision/Assistance  Patient can return home with the following  Assist for transportation;Assistance with cooking/housework    Equipment Recommendations None recommended by PT  Recommendations for Other Services       Functional Status Assessment Patient has had a recent decline in their functional status and demonstrates the ability to make significant improvements in function in a reasonable and predictable amount of time.     Precautions / Restrictions Precautions Precautions: Fall Restrictions Weight Bearing Restrictions: No      Mobility  Bed Mobility Overal bed mobility: Modified Independent                  Transfers Overall transfer level: Needs assistance Equipment used: Rolling walker (2 wheels) Transfers: Sit to/from Stand Sit to Stand: Supervision            General transfer comment: supervision for safety    Ambulation/Gait Ambulation/Gait assistance: Supervision Gait Distance (Feet): 200 Feet Assistive device: Rolling walker (2 wheels) Gait Pattern/deviations: Step-through pattern Gait velocity: Decreased     General Gait Details: Supervision for safety. Pt reporting increased comfort using RW. Distance limited secondary to fatigue oxygen sats [facility]  Stairs            Wheelchair Mobility    Modified Rankin (Stroke Patients Only)       Balance Overall balance assessment: Mild deficits observed, not formally tested                                           Pertinent Vitals/Pain Pain Assessment Pain Assessment: No/denies pain    Home Living Family/patient expects to be discharged to:: Private residence Living Arrangements: Spouse/significant other Available Help at Discharge: Family Type of Home: Other(Comment) (town house) Home Access: Stairs to enter Entrance Stairs-Rails: None Technical brewer of Steps: 1   Home Layout: One level Home Equipment: Shower seat;Cane - single Barista (2 wheels)      Prior Function Prior Level of Function : Independent/Modified Independent             Mobility Comments: uses cane for ambulation       Hand Dominance        Extremity/Trunk Assessment   Upper Extremity Assessment Upper Extremity Assessment: Generalized weakness    Lower Extremity Assessment Lower Extremity Assessment: Generalized weakness    Cervical / Trunk Assessment Cervical / Trunk  Assessment: Normal  Communication   Communication: No difficulties  Cognition Arousal/Alertness: Awake/alert Behavior During Therapy: WFL for tasks assessed/performed Overall Cognitive Status: Within Functional Limits for tasks assessed                                          General Comments      Exercises     Assessment/Plan    PT Assessment All  further PT needs can be met in the next venue of care  PT Problem List Decreased strength;Decreased activity tolerance;Decreased mobility;Decreased balance       PT Treatment Interventions      PT Goals (Current goals can be found in the Care Plan section)  Acute Rehab PT Goals Patient Stated Goal: to be more active PT Goal Formulation: With patient Time For Goal Achievement: 04/05/22 Potential to Achieve Goals: Good    Frequency       Co-evaluation               AM-PAC PT "6 Clicks" Mobility  Outcome Measure Help needed turning from your back to your side while in a flat bed without using bedrails?: None Help needed moving from lying on your back to sitting on the side of a flat bed without using bedrails?: None Help needed moving to and from a bed to a chair (including a wheelchair)?: A Little Help needed standing up from a chair using your arms (e.g., wheelchair or bedside chair)?: A Little Help needed to walk in hospital room?: A Little Help needed climbing 3-5 steps with a railing? : A Little 6 Click Score: 20    End of Session Equipment Utilized During Treatment: Gait belt Activity Tolerance: Patient tolerated treatment well Patient left: in bed;with call bell/phone within reach Nurse Communication: Mobility status PT Visit Diagnosis: Unsteadiness on feet (R26.81);Muscle weakness (generalized) (M62.81)    Time: 6759-1638 PT Time Calculation (min) (ACUTE ONLY): 24 min   Charges:   PT Evaluation $PT Eval Low Complexity: 1 Low PT Treatments $Gait Training: 8-22 mins        Lou Miner, DPT  Acute Rehabilitation Services  Office: [phone]   Rudean Hitt 04/05/2022, 4:12 PM

## 2022-04-05 NOTE — Progress Notes (Signed)
Mobility Specialist Progress Note:   04/05/22 0949  Mobility  Activity Ambulated with assistance in hallway  Level of Assistance Modified independent, requires aide device or extra time  Assistive Device Front wheel walker  Distance Ambulated (ft) 280 ft  Activity Response Tolerated well  $Mobility charge 1 Mobility   Pt received in bed willing to participate in mobility. No complaints of pain. Left EOB with call bell in reach and all needs met.   [facility] Azzam Mehra Mobility Specialist

## 2022-04-06 DIAGNOSIS — Z952 Presence of prosthetic heart valve: Secondary | ICD-10-CM

## 2022-04-06 DIAGNOSIS — D509 Iron deficiency anemia, unspecified: Secondary | ICD-10-CM

## 2022-04-06 DIAGNOSIS — E785 Hyperlipidemia, unspecified: Secondary | ICD-10-CM | POA: Diagnosis not present

## 2022-04-06 DIAGNOSIS — I10 Essential (primary) hypertension: Secondary | ICD-10-CM | POA: Diagnosis not present

## 2022-04-06 DIAGNOSIS — E039 Hypothyroidism, unspecified: Secondary | ICD-10-CM | POA: Diagnosis not present

## 2022-04-06 DIAGNOSIS — I5033 Acute on chronic diastolic (congestive) heart failure: Secondary | ICD-10-CM | POA: Diagnosis not present

## 2022-04-06 LAB — BASIC METABOLIC PANEL
Anion gap: 13 (ref 5–15)
BUN: 36 mg/dL — ABNORMAL HIGH (ref 8–23)
CO2: 23 mmol/L (ref 22–32)
Calcium: 8.5 mg/dL — ABNORMAL LOW (ref 8.9–10.3)
Chloride: 89 mmol/L — ABNORMAL LOW (ref 98–111)
Creatinine, Ser: 1.01 mg/dL — ABNORMAL HIGH (ref 0.44–1.00)
GFR, Estimated: 54 mL/min — ABNORMAL LOW (ref >60)
Glucose, Bld: 102 mg/dL — ABNORMAL HIGH (ref 70–99)
Potassium: 3.6 mmol/L (ref 3.5–5.1)
Sodium: 125 mmol/L — ABNORMAL LOW (ref 135–145)

## 2022-04-06 MED ORDER — TORSEMIDE 20 MG PO TABS
20.0000 mg | ORAL_TABLET | Freq: Every day | ORAL | Status: DC
Start: 2022-04-06 — End: 2022-04-07
  Administered 2022-04-06 – 2022-04-07 (×2): 20 mg via ORAL
  Filled 2022-04-06 (×2): qty 1

## 2022-04-06 NOTE — Plan of Care (Signed)
Nutrition Education Note  RD consulted for nutrition diet education.   RD provided "Low Sodium Nutrition Therapy" handout from the Academy of [employer]. Reviewed patient's dietary recall. Provided examples on ways to decrease sodium intake in diet. Discouraged intake of processed foods and use of salt shaker. Encouraged fresh fruits and vegetables as well as whole grain sources of carbohydrates to maximize fiber intake.   RD discussed why it is important for patient to adhere to diet recommendations, and emphasized the role of fluids, foods to avoid. Teach back method used.  Expect good compliance.  Current diet order is regular diet with thin liquids, patient is consuming approximately 100% of meals at this time. Labs and medications reviewed. No further nutrition interventions warranted at this time. RD contact information provided. If additional nutrition issues arise, please re-consult RD.   Corrin Parker, MS, RD, LDN RD pager number/after hours weekend pager number on Amion.

## 2022-04-06 NOTE — Plan of Care (Signed)
  Problem: Skin Integrity: Goal: Risk for impaired skin integrity will decrease Outcome: Not Progressing   Problem: Safety: Goal: Ability to remain free from injury will improve Outcome: Not Progressing   Problem: Pain Managment: Goal: General experience of comfort will improve Outcome: Not Progressing   Problem: Elimination: Goal: Will not experience complications related to bowel motility Outcome: Not Progressing Goal: Will not experience complications related to urinary retention Outcome: Not Progressing

## 2022-04-06 NOTE — Progress Notes (Signed)
Mobility Specialist Progress Note:   04/06/22 1500  Mobility  Activity Ambulated with assistance in hallway  Level of Assistance Independent after set-up  Assistive Device Front wheel walker  Distance Ambulated (ft) 500 ft  Activity Response Tolerated well  $Mobility charge 1 Mobility   Pt received EOB willing to participate in mobility. No complaints of pain. Left in bed with call bell in reach and all needs met.   [facility] Zoe [name] Mobility Specialist

## 2022-04-06 NOTE — Care Management Important Message (Signed)
Important Message  Patient Details  Name: Zoe [name] MRN: [id number] Date of Birth: 02-18-1934   Medicare Important Message Given:  Yes     Shelda Altes 04/06/2022, 7:43 AM

## 2022-04-06 NOTE — TOC Progression Note (Signed)
Transition of Care [facility]) - Progression Note    Patient Details  Name: Zoe [name] MRN: [id number] Date of Birth: 1934-03-13  Transition of [facility]) CM/SW Contact  Zenon Mayo, RN Phone Number: 04/06/2022, 4:41 PM  Clinical Narrative:    Patient is form home with her spouse , NCM offered choice for HHPT, she states Alvis Lemmings is ok . NCM made referral to [facility] with Alvis Lemmings , He is able to take referral.  Soc will begin 24 to 48 hrs post  dc.  She states her daughter or son will transport her home at dc.     Expected Discharge Plan: [place] Barriers to Discharge: Continued Medical Work up  Expected Discharge Plan and Services Expected Discharge Plan: [place]   Discharge Planning Services: CM Consult Post Acute Care Choice: [place] arrangements for the past 2 months: Single Family Home                   DME Agency: NA       HH Arranged: PT HH Agency: [employer] Date [place]: 04/06/22 Time HH Agency Contacted: 1640 Representative spoke with at [place]: [place] ([place]) Interventions    Readmission Risk Interventions     No data to display

## 2022-04-06 NOTE — Progress Notes (Signed)
Subjective:  Feels well Occasional shortness of breath with attempts to take deep breath  Not on supplemental oxygen this morning  Objective:  Vital Signs in the last 24 hours: Temp:  [97.4 F (36.3 C)-98 F (36.7 C)] 97.4 F (36.3 C) (06/15 0739) Pulse Rate:  [67-75] 67 (06/15 0739) Resp:  [16-18] 16 (06/15 0739) BP: (101-141)/(45-83) 141/55 (06/15 0739) SpO2:  [96 %-100 %] 96 % (06/15 0650) Weight:  [77.8 kg] 77.8 kg (06/15 0500)  Intake/Output from previous day: 06/14 0701 - 06/15 0700 In: 483 [P.O.:480; I.V.:3] Out: 1100 [Urine:1100]  Physical Exam Vitals and nursing note reviewed.  Constitutional:      General: She is not in acute distress. Neck:     Vascular: No JVD.  Cardiovascular:     Rate and Rhythm: Normal rate and regular rhythm.     Heart sounds: Normal heart sounds. No murmur heard. Pulmonary:     Effort: Pulmonary effort is normal.     Breath sounds: Normal breath sounds. No wheezing or rales.  Musculoskeletal:     Right lower leg: No edema.     Left lower leg: No edema.     Imaging/tests reviewed and independently interpreted:  Cardiac Studies:  Telemetry 04/04/2022: No significant arrhythmia  EKG 04/02/2022: Sinus rhythm Normal EKG   Echocardiogram 04/03/2022:  1. Left ventricular ejection fraction, by estimation, is 60 to 65%. The  left ventricle has normal function. The left ventricle has no regional  wall motion abnormalities. There is moderate left ventricular hypertrophy.  Left ventricular diastolic  parameters are consistent with Grade II diastolic dysfunction  (pseudonormalization).   2. Right ventricular systolic function is normal. The right ventricular  size is normal. Tricuspid regurgitation signal is inadequate for assessing  PA pressure.   3. Left atrial size was moderately dilated.   4. The mitral valve is degenerative. Trivial mitral valve regurgitation.  Moderate mitral annular calcification.   5. Aortic valve  regurgitation is not visualized. Procedure Date:  09/03/2018.   6. Aortic no signficant ascending aortic aneurysm.   7. The inferior vena cava is normal in size with greater than 50%  respiratory variability, suggesting right atrial pressure of 3 mmHg.   Assessment & Recommendations:  88 y/o Caucasian female with hypertension, hyperlipidemia, severe AS-now s/p 23 mm Edwards Sapien TAVR (2019), admitted with hyponatremia and acute on chronic HFpEF   HFpEF: Likely acute on chronic, now resolved Grade 2 diastolic dysfunction on echocardiogram.  Elevated BNP. If no hypoxia on ambulation on room air, okay to discharge today on Farxiga 10 mg, and Lasix p.o. 20 mg daily, losartan 50 mg daily. We will arrange outpatient labs and follow-up. Recommend 2 g sodium restriction diet.   Hyponatremia: Likely dilutional hyponatremia, as well as contribution from hydrochlorothiazide. Improving   Cardiology will sign off. Outpatient f/u 6/22. Will check BMP before then.    Nigel Mormon, MD Pager: [phone] Office: [phone]

## 2022-04-06 NOTE — Progress Notes (Signed)
Progress Note   Patient: Zoe [name] ZOX:[id number] DOB: 02-29-1934 DOA: 04/02/2022     3 DOS: the patient was seen and examined on 04/06/2022   Brief hospital course: 88 year old with past medical history significant for breast cancer, GERD, hypertension, hyperlipidemia, hypothyroidism, aortic stenosis status post TAVR who presents to the ED complaining of worsening shortness of breath on exertion for 1 week.  She presented to the ED as recommended by her cardiologist for diuresis/   Assessment and Plan: * Acute exacerbation of diastolic CHF (congestive heart failure) ([place]) -Acute on chronic -started on low dose IV lasix at '20mg'$  and has diuresed 1 L since admission.  Weight is down from 175.5 pounds to 171 -Continue strict I/O and daily weights -recent echo in 4/23, echo this admission reveals preserved LVEF with grade 2 diastolic dysfunction and moderate LVH as well as moderate dilatation of the LA. -continue beta-blocker, Wilder Glade and adjusted dose of diuretic -Low-sodium diet discussed with patient. -Close monitoring of renal function and electrolytes. -Anticipating discharge home on 04/07/2022.  Acute kidney injury what appears to be chronic kidney disease stage II at baseline -Baseline renal function with a creatinine of 0.94 and normal BUN. -Renal function on 6/14 BUN 35 and creatinine 1.31 -Appears to be in the setting of acute diuresis and decreased perfusion with CHF exacerbation and continue ARB usage. -Diuretics and ARB will hold for over 24 hours; creatinine down to 1.01 currently. -Low-dose Demadex has been now resumed to further guarantee volume and stability -Will watch renal function reaction. -Continue holding ARB at this point -Maintain adequate hydration.  Microcytic anemia -Labs from 2 years ago show hgb of 9.5-10.4 -No overt bleeding; no transfusion needed -Follow hemoglobin trend.  Hyponatremia -Suspect secondary to recent hydrochlorothiazide. -HCTZ will  be discontinued at time of discharge -Continue use of daily torsemide for volume management -Advised to follow heart healthy/low-sodium diet. -TSH normal, osmolality 269, urine specific gravity 1.005, urine osmolality 278 and urine sodium 112, cortisol normal  Hyperlipidemia -Continue pravachol  -Heart healthy diet discussed with patient.  Hypertension -Continue to hold HCTZ/with plan to discontinue at time of discharge. -Continue coreg and continue holding ARB. -Patient is started on daily Demadex.  Hypothyroidism -TSH normal -Continue home dose of Synthroid.  S/P TAVR (transcatheter aortic valve replacement) -Echo 01/2022: mild prosthetic AV stenosis is new. -Continue outpatient follow-up with cardiology service.  Overweight/class I obesity -Body mass index is 30.4 kg/m. -Low calorie diet, portion control discussed with patient.   Subjective No chest pain, no nausea, no vomiting.  Reporting improvement in her breathing and at this point no requiring oxygen supplementation.  Patient expressed feeling short winded with activity and having increasing urine output.  Physical Exam: Vitals:   04/06/22 0500 04/06/22 0508 04/06/22 0650 04/06/22 0739  BP:  128/62  (!) 141/55  Pulse:  71  67  Resp:  17  16  Temp:  97.6 F (36.4 C)  (!) 97.4 F (36.3 C)  TempSrc:  Oral  Oral  SpO2:  97% 96%   Weight: 77.8 kg     Height:       General exam: Alert, awake, oriented x 3; no requiring oxygen supplementation at rest; still feeling short winded with activity but significantly improved.  No chest pain, no nausea, no vomiting. Respiratory system: Decreased breath sounds at the bases; no frank crackles.  No wheezing on exam.  No using accessory muscle. Cardiovascular system: Rate controlled, no rubs, no gallops, no JVD on exam. Gastrointestinal system: Abdomen  is nondistended, soft and nontender. No organomegaly or masses felt. Normal bowel sounds heard. Central nervous system: Alert and  oriented. No focal neurological deficits. Extremities: No cyanosis or clubbing; trace edema appreciated on exam. Skin: No petechiae. Psychiatry: Judgement and insight appear normal. Mood & affect appropriate.   Data Reviewed: Sodium 125, potassium 3.6, chloride 89, bicarb 23, BUN 36 and creatinine 1.01.  GFR 54.  Family Communication: Patient only  Disposition: Status is: Inpatient Remains inpatient appropriate because: Needs continuing monitoring given change in renal function requiring cessation of several medications; transition to oral route and to assess urine output response with oral meds.   Planned Discharge Destination: Home  DVT prophylaxis: Lovenox.  SIGNED: Barton Dubois, MD [facility],  Pager (please use [email or web address] to page/text)  Please use Epic Secure Chat for non-urgent communication (7AM-7PM) If 7PM-7AM, please contact night-coverage [email or web address],  04/06/2022, 3:10 PM

## 2022-04-06 NOTE — Progress Notes (Signed)
If no hypoxia on ambulation on room air, okay to discharge today on Farxiga 10 mg, and Lasix p.o. 20 mg daily, losartan 50 mg daily. We will arrange outpatient labs and follow-up.   Nigel Mormon, MD Pager: [phone] Office: [phone]

## 2022-04-06 NOTE — Progress Notes (Signed)
Pt on RA overnight, SPO2 92-97%. A&Ox4, in no apparent cardiac or respiratory distress. Denies chest pain or SOB.Will continue to monitor.

## 2022-04-07 DIAGNOSIS — I10 Essential (primary) hypertension: Secondary | ICD-10-CM | POA: Diagnosis not present

## 2022-04-07 DIAGNOSIS — E669 Obesity, unspecified: Secondary | ICD-10-CM

## 2022-04-07 DIAGNOSIS — E785 Hyperlipidemia, unspecified: Secondary | ICD-10-CM | POA: Diagnosis not present

## 2022-04-07 DIAGNOSIS — I5033 Acute on chronic diastolic (congestive) heart failure: Secondary | ICD-10-CM | POA: Diagnosis not present

## 2022-04-07 DIAGNOSIS — E871 Hypo-osmolality and hyponatremia: Secondary | ICD-10-CM | POA: Diagnosis not present

## 2022-04-07 LAB — BASIC METABOLIC PANEL
Anion gap: 11 (ref 5–15)
BUN: 41 mg/dL — ABNORMAL HIGH (ref 8–23)
CO2: 24 mmol/L (ref 22–32)
Calcium: 8.5 mg/dL — ABNORMAL LOW (ref 8.9–10.3)
Chloride: 94 mmol/L — ABNORMAL LOW (ref 98–111)
Creatinine, Ser: 1.19 mg/dL — ABNORMAL HIGH (ref 0.44–1.00)
GFR, Estimated: 45 mL/min — ABNORMAL LOW (ref >60)
Glucose, Bld: 98 mg/dL (ref 70–99)
Potassium: 3.7 mmol/L (ref 3.5–5.1)
Sodium: 129 mmol/L — ABNORMAL LOW (ref 135–145)

## 2022-04-07 MED ORDER — TORSEMIDE 20 MG PO TABS: 20.0000 mg | ORAL_TABLET | Freq: Every day | ORAL | 1 refills | Status: DC

## 2022-04-07 MED ORDER — DAPAGLIFLOZIN PROPANEDIOL 10 MG PO TABS: 10.0000 mg | ORAL_TABLET | Freq: Every day | ORAL | 2 refills | Status: DC

## 2022-04-07 MED ORDER — LOSARTAN POTASSIUM 25 MG PO TABS: 12.5000 mg | ORAL_TABLET | Freq: Every day | ORAL | 1 refills | Status: DC

## 2022-04-07 NOTE — Discharge Summary (Signed)
Physician Discharge Summary   Patient: Zoe [name] MRN: [id number] DOB: 03-Jun-1934  Admit date:     04/02/2022  Discharge date: 04/07/22  Discharge Physician: Barton Dubois   PCP: [facility]   Recommendations at discharge:  Repeat basic metabolic panel to follow electrolytes and renal function Reassess blood pressure and adjust antihypertensive treatment as needed Continue assisting patient with weight management. Patient patient has follow-up with as instructed. Repeat CBC to follow hemoglobin trend/stability.  Discharge Diagnoses: Principal Problem:   Acute exacerbation of CHF (congestive heart failure) (HCC) Active Problems:   Hyponatremia   Microcytic anemia   Hypertension   S/P TAVR (transcatheter aortic valve replacement)   Hypothyroidism   Hyperlipidemia   Class 1 obesity   CHF (congestive heart failure) [facility])  Hospital Course: As per H&P written by Dr. Rogers Blocker on 04/02/2022 Zoe [name] is a 88 y.o. female with medical history significant of hx of breast cancer, GERD, HTN, HLD, hypothyroidism, aortic stenosis s/p TAVR who presented to ED for worsening dyspnea on exertion x 1 week. She talked to her cardiologist who recommended she come to ED for IV diuresis. She also has had a strange feeling in her chest that she can't describe. Its not pain or pressure. She denies any palpitations. She has not been sick recently.  She has some mild congestion with intermittent cough. She wonders if it's allergies. She states its more to clear her throat. She also has new lower leg swelling over the past week. She denies any weight gain. She also admits to mild orthopnea.    Her cardiologist increased her hctz over the past 2 days.   Denies any fever/chills, vision changes/headaches, chest pain or palpitations,  cough, abdominal pain, N/V/D, she has some mild dysuria.    Does not smoke or drink alcohol   Assessment and Plan: * Acute exacerbation of  diastolic CHF (congestive heart failure) (HCC) -Acute on chronic -Great response to IV diuresis throughout hospitalization; roughly 2.5 L negative since admission.  Weight is down from 175.5 pounds to 169 pounds at discharge. -recent echo in 4/23, echo this admission reveals preserved LVEF with grade 2 diastolic dysfunction and moderate LVH as well as moderate dilatation of the LA. -continue beta-blocker, Farxiga and adjusted dose of diuretic -Low-sodium diet and daily weights discussed with patient. -Continue close monitoring of renal function and electrolytes. -Patient will be discharged home today with follow-up with PCP and cardiology as an outpatient.   Acute kidney injury what appears to be chronic kidney disease stage II at baseline -Baseline renal function with a creatinine of 0.94 and normal BUN. -Renal function on 6/14 BUN 35 and creatinine 1.31 -Appears to be in the setting of acute diuresis and decreased perfusion with CHF exacerbation and continue ARB usage. -Diuretics and ARB were hold for over 24 hours; creatinine down to 1.19 at discharge. -Low-dose Demadex has been now resumed to further guarantee volume stability -Will recommend close follow-up outpatient renal function at follow-up visit. -Safe to resume adjusted dose of ARB. -Patient advised to maintain adequate hydration.   Microcytic anemia -Labs from 2 years ago show hgb of 9.5-10.4 -No overt bleeding; no transfusion needed -Follow hemoglobin trend.   Hyponatremia -Suspect secondary to recent hydrochlorothiazide usage along with hemodilution from exacerbation of diastolic heart failure.. -HCTZ will be discontinued at time of discharge. -Continue use of daily torsemide for volume management -Advised to follow heart healthy/low-sodium diet. -TSH normal, osmolality 269, urine specific gravity 1.005, urine osmolality 278 and urine  sodium 112, cortisol normal -At discharge sodium level 129.    Hyperlipidemia -Continue pravachol  -Heart healthy diet discussed with patient.   Hypertension -Continue treatment with adjusted dose of ARB, continue the use of chronic and initiation of Demadex. -Heart healthy diet/low-sodium discussed with patient. -Reassess blood pressure at follow-up visit with further adjustment to her antihypertensive agents as required.   Hypothyroidism -TSH normal -Continue home dose of Synthroid.   S/P TAVR (transcatheter aortic valve replacement) -Echo 01/2022: mild prosthetic AV stenosis is new. -Continue outpatient follow-up with cardiology service.   Overweight/class I obesity -Body mass index is 30.4 kg/m. -Low calorie diet, portion control discussed with patient.   Consultants: Cardiology service Procedures performed: See below for x-ray reports. Disposition: Home with home health services. Diet recommendation: Heart healthy/low calorie diet.  DISCHARGE MEDICATION: Allergies as of 04/07/2022       Reactions   Penicillins Other (See Comments)   UNSPECIFIED REACTION  Patient does not remember reaction.  Has patient had a PCN reaction causing immediate rash, facial/tongue/throat swelling, SOB or lightheadedness with hypotension: no Has patient had a PCN reaction causing severe rash involving mucus membranes or skin necrosis: no Has patient had a PCN reaction that required hospitalization no Has patient had a PCN reaction occurring within the last 10 years: no If all of the above answers are "NO", then may proceed with Cephalosporin use.   Sulfa Antibiotics    UNSPECIFIED REACTION  "maybe vision issues? "        Medication List     STOP taking these medications    ciprofloxacin 0.3 % ophthalmic solution Commonly known as: CILOXAN   hydrochlorothiazide 12.5 MG capsule Commonly known as: MICROZIDE   valsartan 320 MG tablet Commonly known as: DIOVAN       TAKE these medications    aspirin 81 MG chewable tablet Chew 1 tablet  (81 mg total) by mouth daily. What changed: when to take this   carvedilol 12.5 MG tablet Commonly known as: COREG Take 12.5 mg by mouth 2 (two) times daily with a meal.   dapagliflozin propanediol 10 MG Tabs tablet Commonly known as: FARXIGA Take 1 tablet (10 mg total) by mouth daily.   esomeprazole 20 MG capsule Commonly known as: NEXIUM Take 20 mg by mouth daily.   ferrous sulfate 325 (65 FE) MG tablet Take 325 mg by mouth every other day.   levothyroxine 100 MCG tablet Commonly known as: SYNTHROID Take 100 mcg by mouth daily before breakfast.   losartan 25 MG tablet Commonly known as: COZAAR Take 0.5 tablets (12.5 mg total) by mouth daily. What changed: how much to take   pravastatin 40 MG tablet Commonly known as: PRAVACHOL Take 40 mg by mouth every evening.   PreserVision AREDS 2 Caps Take 1 capsule by mouth 2 (two) times daily.   Soothe XP Soln Place 1 drop into both eyes every evening.   torsemide 20 MG tablet Commonly known as: DEMADEX Take 1 tablet (20 mg total) by mouth daily.   Vitamin D3 50 MCG (2000 UT) Tabs Take 2,000 Units by mouth daily.        Follow-up Information     Alethia Berthold, PA-C Follow up on 04/13/2022.   Specialty: Cardiology Why: 10:00 AM Contact information: [place] [address] [phone]         [facility] Follow up.   Specialty: Rheumatology Contact information: [address] [place] [address] [phone]  [facility] Follow up.   Specialty: Home Health Services Why: [place] will call you to schedule apt times. Contact information: [place] [address] [phone]         [facility] Schedule an appointment as soon as possible for a visit in 2 week(s).   Specialty: Rheumatology Contact information: [place] [address] [phone]                 Discharge Exam: Danley Danker Weights   04/04/22 0025 04/05/22 0343 04/06/22 0500  Weight: 78 kg 77.7 kg 77.8 kg   General exam: Alert, awake, oriented x 3; no chest pain, no nausea, no vomiting, no palpitations.  Reports breathing back to baseline and denies orthopnea. Respiratory system: Clear to auscultation.  Good air movement bilaterally; no using accessory muscle.  Good saturation on room air. Cardiovascular system:RRR. No rubs or gallops; no JVD. Gastrointestinal system: Abdomen is nondistended, soft and nontender. No organomegaly or masses felt. Normal bowel sounds heard. Central nervous system: Alert and oriented. No focal neurological deficits. Extremities: No cyanosis or clubbing. Skin: No rashes, no petechiae. Psychiatry: Judgement and insight appear normal. Mood & affect appropriate.    Condition at discharge: Stable and improved.  The results of significant diagnostics from this hospitalization (including imaging, microbiology, ancillary and laboratory) are listed below for reference.   Imaging Studies: ECHOCARDIOGRAM COMPLETE  Result Date: 04/03/2022    ECHOCARDIOGRAM REPORT   Patient Name:   SAMUEL RITTENHOUSE Date of Exam: 04/03/2022 Medical Rec #:  [id number]           Height:       63.0 in Accession #:    [id number]          Weight:       179.0 lb Date of Birth:  06-Nov-1933            BSA:          1.845 m Patient Age:    76 years            BP:           140/129 mmHg Patient Gender: F                   HR:           73 bpm. Exam Location:  Inpatient Procedure: 2D Echo, Cardiac Doppler and Color Doppler Indications:    Congestive heart failure  History:        Patient has prior history of Echocardiogram examinations, most                 recent 02/19/2022. Aortic Valve Disease, Signs/Symptoms:Shortness                 of Breath and Dyspnea; Risk Factors:Hypertension and HLD.                 History of breast cancer.  Sonographer:    Joette Catching RCS Referring Phys:  [id number] [place]  1. Left ventricular ejection fraction, by estimation, is 60 to 65%. The left ventricle has normal function. The left ventricle has no regional wall motion abnormalities. There is moderate left ventricular hypertrophy. Left ventricular diastolic parameters are consistent with Grade II diastolic dysfunction (pseudonormalization).  2. Right ventricular systolic function is normal. The right ventricular size is normal. Tricuspid regurgitation signal is inadequate for assessing PA pressure.  3. Left atrial size was moderately dilated.  4. The mitral valve is degenerative. Trivial mitral valve regurgitation. Moderate mitral annular calcification.  5. Aortic valve regurgitation is not visualized. Procedure Date: 09/03/2018.  6. Aortic no signficant ascending aortic aneurysm.  7. The inferior vena cava is normal in size with greater than 50% respiratory variability, suggesting right atrial pressure of 3 mmHg. Comparison(s): No significant change from prior study. FINDINGS  Left Ventricle: Left ventricular ejection fraction, by estimation, is 60 to 65%. The left ventricle has normal function. The left ventricle has no regional wall motion abnormalities. The left ventricular internal cavity size was normal in size. There is  moderate left ventricular hypertrophy. Left ventricular diastolic parameters are consistent with Grade II diastolic dysfunction (pseudonormalization). Right Ventricle: The right ventricular size is normal. Right ventricular systolic function is normal. Tricuspid regurgitation signal is inadequate for assessing PA pressure. Left Atrium: Left atrial size was moderately dilated. Right Atrium: Right atrial size was normal in size. Pericardium: Trivial pericardial effusion is present. Mitral Valve: The mitral valve is degenerative in appearance. There is moderate calcification of the mitral valve leaflet(s). Moderate mitral annular calcification. Trivial mitral valve  regurgitation. Tricuspid Valve: Tricuspid valve regurgitation is trivial. Aortic Valve: Aortic prosthesis is well seated. No paravalvular leak. DI 0.36. AV peak velocity 2.8 m/s. No significant changes from prior echo. Aortic valve regurgitation is not visualized. Aortic valve mean gradient measures 18.3 mmHg. Aortic valve peak gradient measures 32.2 mmHg. Aortic valve area, by VTI measures 1.13 cm. There is a 23 mm Edwards Sapien prosthetic, stented (TAVR) valve present in the aortic position. Pulmonic Valve: Pulmonic valve regurgitation is mild. Aorta: No signficant ascending aortic aneurysm. Venous: The inferior vena cava is normal in size with greater than 50% respiratory variability, suggesting right atrial pressure of 3 mmHg. IAS/Shunts: No atrial level shunt detected by color flow Doppler.  LEFT VENTRICLE PLAX 2D LVIDd:         4.30 cm   Diastology LVIDs:         3.10 cm   LV e' medial:    5.66 cm/s LV PW:         0.70 cm   LV E/e' medial:  30.9 LV IVS:        1.70 cm   LV e' lateral:   9.03 cm/s LVOT diam:     2.00 cm   LV E/e' lateral: 19.4 LV SV:         80 LV SV Index:   44 LVOT Area:     3.14 cm  RIGHT VENTRICLE             IVC RV Basal diam:  3.50 cm     IVC diam: 1.90 cm RV Mid diam:    1.80 cm RV S prime:     12.40 cm/s TAPSE (M-mode): 2.2 cm LEFT ATRIUM             Index        RIGHT ATRIUM           Index LA diam:        5.00 cm 2.71 cm/m   RA Area:     18.20 cm LA Vol (A2C):   73.7 ml 39.95 ml/m  RA Volume:   48.30 ml  26.18 ml/m LA Vol (A4C):   90.7 ml 49.17 ml/m LA Biplane Vol: 84.6 ml 45.86 ml/m  AORTIC VALVE                     PULMONIC VALVE  AV Area (Vmax):    1.05 cm      PV Vmax:          1.33 m/s AV Area (Vmean):   1.09 cm      PV Peak grad:     7.1 mmHg AV Area (VTI):     1.13 cm      PR End Diast Vel: 9.86 msec AV Vmax:           283.67 cm/s AV Vmean:          204.000 cm/s AV VTI:            0.713 m AV Peak Grad:      32.2 mmHg AV Mean Grad:      18.3 mmHg LVOT Vmax:          95.10 cm/s LVOT Vmean:        71.000 cm/s LVOT VTI:          0.256 m LVOT/AV VTI ratio: 0.36  AORTA Ao Root diam: 2.80 cm Ao Asc diam:  3.40 cm MITRAL VALVE MV Area (PHT): 4.86 cm     SHUNTS MV Decel Time: 156 msec     Systemic VTI:  0.26 m MV E velocity: 175.00 cm/s  Systemic Diam: 2.00 cm MV A velocity: 98.10 cm/s MV E/A ratio:  1.78 Mary Scientist, physiological signed by Phineas Inches Signature Date/Time: 04/03/2022/10:34:59 AM    Final    DG Chest 2 View  Result Date: 04/02/2022 CLINICAL DATA:  Triage notes:"Patient complains of shortness of breath and funny feeling in chest x 1 week. Had labs drawn yesterday and was called today and told sodium low and in heart failure. Patient alert and oriented, speaking complete sentences" EXAM: CHEST - 2 VIEW COMPARISON:  11/07/2018. FINDINGS: Cardiac silhouette is mildly enlarged. Stable changes from prior PAVR. No mediastinal or hilar masses or evidence of adenopathy. Lungs are clear.  No pleural effusion or pneumothorax. Previous vertebroplasty at L1, unchanged. IMPRESSION: No acute cardiopulmonary disease. Electronically Signed   By: Lajean Manes M.D.   On: 04/02/2022 12:39    Microbiology: Results for orders placed or performed during the hospital encounter of 08/30/18  Surgical pcr screen     Status: None   Collection Time: 08/30/18  9:12 AM   Specimen: Nasal Mucosa; Nasal Swab  Result Value Ref Range Status   MRSA, PCR NEGATIVE NEGATIVE Final   Staphylococcus aureus NEGATIVE NEGATIVE Final    Comment: (NOTE) The Xpert SA Assay (FDA approved for NASAL specimens in patients 61 years of age and older), is one component of a comprehensive surveillance program. It is not intended to diagnose infection nor to guide or monitor treatment. Performed at [facility], [place] [address], [place], [place] [address]     Labs: CBC: Recent Labs  Lab 04/02/22 1154 04/03/22 0632 04/05/22 0342  WBC 10.6* 8.8 7.9  NEUTROABS 8.0*  --   --   HGB 8.2* 8.1*  8.4*  HCT 25.1* 24.9* 26.2*  MCV 85.4 83.8 84.2  PLT 263 238 329   Basic Metabolic Panel: Recent Labs  Lab 04/02/22 1830 04/03/22 0632 04/04/22 0640 04/05/22 0342 04/06/22 0422 04/07/22 0323  NA  --  124* 125* 124* 125* 129*  K  --  3.8 3.6 3.2* 3.6 3.7  CL  --  90* 85* 88* 89* 94*  CO2  --  '25 26 26 23 24  '$ GLUCOSE  --  99 116* 111* 102* 98  BUN  --  18 23 35* 36* 41*  CREATININE  --  0.94 0.93 1.31* 1.01* 1.19*  CALCIUM  --  8.0* 9.0 8.3* 8.5* 8.5*  MG 1.8  --   --   --   --   --     Discharge time spent: greater than 30 minutes.  Signed: Barton Dubois, MD [facility] 04/07/2022

## 2022-04-07 NOTE — TOC Transition Note (Signed)
Transition of Care [facility]) - CM/SW Discharge Note   Patient Details  Name: KAWTHAR ENNEN MRN: [id number] Date of Birth: 1934-04-28  Transition of Care [facility]) CM/SW Contact:  Zenon Mayo, RN Phone Number: 04/07/2022, 8:15 AM   Clinical Narrative:    Patient is for dc today, NCM notified Tommi Rumps with [facility].  She has transport .  Meds are being sent to her local pharmacy. She has no needs.    Final next level of care: [place] Barriers to Discharge: Continued Medical Work up   Patient Goals and CMS Choice Patient states their goals for this hospitalization and ongoing recovery are:: return home with [facility]   Choice offered to / list presented to : Patient  Discharge Placement                       Discharge Plan and Services   Discharge Planning Services: CM Consult Post Acute Care Choice: Home Health            DME Agency: NA       HH Arranged: PT HH Agency: [place] Date [place]: 04/06/22 Time HH Agency Contacted: 1640 Representative spoke with at [place]: [place] Determinants of Health ([place]) Interventions     Readmission Risk Interventions     No data to display

## 2022-04-07 NOTE — Progress Notes (Signed)
Discharge order in place.  Discharge instructions given to the patient.  Patient verbalized understanding.  Needs addressed.  Discharged home.

## 2022-04-07 NOTE — Progress Notes (Signed)
Mobility Specialist Progress Note:   04/07/22 0921  Mobility  Activity Ambulated with assistance in hallway  Level of Assistance Modified independent, requires aide device or extra time  Assistive Device Front wheel walker  Distance Ambulated (ft) 500 ft  Activity Response Tolerated well  $Mobility charge 1 Mobility   Pt received in bed willing to participate in mobility. No complaints of pain. Left in bed with call bell in reach and all needs met.   [facility] Jakorian Marengo Mobility Specialist

## 2022-04-10 ENCOUNTER — Other Ambulatory Visit: Payer: Self-pay | Admitting: Cardiology

## 2022-04-10 DIAGNOSIS — I5031 Acute diastolic (congestive) heart failure: Secondary | ICD-10-CM

## 2022-04-10 DIAGNOSIS — E871 Hypo-osmolality and hyponatremia: Secondary | ICD-10-CM

## 2022-04-10 NOTE — Progress Notes (Signed)
ICD-10-CM   1. Acute diastolic heart failure (HCC)  P79.21 Basic metabolic panel    Brain natriuretic peptide    Brain natriuretic peptide    Basic metabolic panel    2. Hyponatremia  B83.7 Basic metabolic panel    Brain natriuretic peptide    Brain natriuretic peptide    Basic metabolic panel

## 2022-04-11 LAB — BASIC METABOLIC PANEL
BUN/Creatinine Ratio: 37 — ABNORMAL HIGH (ref 12–28)
BUN: 47 mg/dL — ABNORMAL HIGH (ref 8–27)
CO2: 25 mmol/L (ref 20–29)
Calcium: 9.2 mg/dL (ref 8.7–10.3)
Chloride: 87 mmol/L — ABNORMAL LOW (ref 96–106)
Creatinine, Ser: 1.26 mg/dL — ABNORMAL HIGH (ref 0.57–1.00)
Glucose: 118 mg/dL — ABNORMAL HIGH (ref 70–99)
Potassium: 4 mmol/L (ref 3.5–5.2)
Sodium: 125 mmol/L — ABNORMAL LOW (ref 134–144)
eGFR: 42 mL/min/{1.73_m2} — ABNORMAL LOW (ref >59)

## 2022-04-11 LAB — BRAIN NATRIURETIC PEPTIDE: BNP: 66.1 pg/mL (ref 0.0–100.0)

## 2022-04-12 NOTE — Progress Notes (Unsigned)
Primary Physician:  [facility]   Patient ID: Zoe [name], female    DOB: [birth year], 88 y.o.   MRN: [id number]  Subjective:    No chief complaint on file.   HPI: Zoe [name]  is a 88 y.o. female  with history of possibly mild coronary spasm by coronary angiogram in 2014 when she presented with angina pectoris, otherwise no significant CAD, severe AS s/p TAVR by Dr. Roxy Manns on 11/12., asymptomatic carotid artery stenosis,  essential hypertension, hyperlipidemia, and GERD.   Patient was admitted 04/03/2022 - 04/05/2022 with acute on chronic diastolic heart failure and hyponatremia (likely dilutional), as well as AKI.  Patient was diuresed and discharged on the following medications from a cardiac standpoint: Aspirin, carvedilol, Farxiga, losartan, pravastatin, torsemide 20 mg p.o. daily.  Patient now presents for hospital follow-up.***  ***Needs BMP   She is presently doing well except for mild chronic dyspnea, she has stopped exercising again for no particular reason but states that since her husband has COPD, they are worried about COVID.  No PND or orthopnea.  Denies TIA, denies headache, visual disturbances.   Past Medical History:  Diagnosis Date   Arthritis    some - per patient   Breast cancer ([place])    breast cancer / left    Cataract    bilat    GERD (gastroesophageal reflux disease)    History of kidney stones    Hyperlipidemia    Hypertension    Hypothyroidism    Macular degeneration    Left   S/P TAVR (transcatheter aortic valve replacement) 09/03/2018   23 mm Edwards Sapien 3 transcatheter heart valve placed via percutaneous right transfemoral approach    Severe aortic stenosis    Stress incontinence    Thyroid disease    Tinnitus     Social History   Tobacco Use   Smoking status: Never   Smokeless tobacco: Never  Substance Use Topics   Alcohol use: No     Review of Systems  Cardiovascular:  Positive for dyspnea on  exertion. Negative for chest pain and leg swelling.   Objective:  There were no vitals taken for this visit. There is no height or weight on file to calculate BMI.     04/07/2022    8:11 AM 04/06/2022    7:43 PM 04/06/2022    4:48 PM  Vitals with BMI  Systolic 315 400 867  Diastolic 44 49 61  Pulse 71 71 69      Physical Exam Vitals reviewed.  Cardiovascular:     Rate and Rhythm: Normal rate and regular rhythm.     Pulses: Normal pulses and intact distal pulses.          Carotid pulses are  on the right side with bruit and  on the left side with bruit.    Heart sounds: Murmur heard.     Early systolic murmur is present with a grade of 1/6 at the upper right sternal border.  Pulmonary:     Effort: Pulmonary effort is normal. No accessory muscle usage or respiratory distress.     Breath sounds: Normal breath sounds.  Abdominal:     General: Bowel sounds are normal.     Palpations: Abdomen is soft.  Musculoskeletal:     Right lower leg: No edema.  Skin:    Capillary Refill: Capillary refill takes less than 2 seconds.    Radiology: No results found.  Laboratory examination:    External  labs  Labs 10/04/2021:  A1c 5.7%.  TSH normal at 1.18.  Vitamin D38.6.  Hb 10.3/HCT 32.3, platelets 232.  BUN 23, creatinine 0.87, EGFR 60 mill, potassium 4.1.  Total cholesterol 113, triglycerides 100, HDL 43, LDL 50.   Current Outpatient Medications:    Artificial Tear Solution (SOOTHE XP) SOLN, Place 1 drop into both eyes every evening., Disp: , Rfl:    aspirin 81 MG chewable tablet, Chew 1 tablet (81 mg total) by mouth daily. (Patient taking differently: Chew 81 mg by mouth every evening.), Disp: , Rfl:    carvedilol (COREG) 12.5 MG tablet, Take 12.5 mg by mouth 2 (two) times daily with a meal., Disp: , Rfl:    Cholecalciferol (VITAMIN D3) 2000 units TABS, Take 2,000 Units by mouth daily., Disp: , Rfl:    dapagliflozin propanediol (FARXIGA) 10 MG TABS tablet, Take 1 tablet (10 mg  total) by mouth daily., Disp: 30 tablet, Rfl: 2   esomeprazole (NEXIUM) 20 MG capsule, Take 20 mg by mouth daily., Disp: , Rfl:    ferrous sulfate 325 (65 FE) MG tablet, Take 325 mg by mouth every other day., Disp: , Rfl:    levothyroxine (SYNTHROID, LEVOTHROID) 100 MCG tablet, Take 100 mcg by mouth daily before breakfast., Disp: , Rfl: 2   losartan (COZAAR) 25 MG tablet, Take 0.5 tablets (12.5 mg total) by mouth daily., Disp: 30 tablet, Rfl: 1   Multiple Vitamins-Minerals (PRESERVISION AREDS 2) CAPS, Take 1 capsule by mouth 2 (two) times daily., Disp: , Rfl:    pravastatin (PRAVACHOL) 40 MG tablet, Take 40 mg by mouth every evening., Disp: , Rfl:    torsemide (DEMADEX) 20 MG tablet, Take 1 tablet (20 mg total) by mouth daily., Disp: 30 tablet, Rfl: 1     Cardiac Studies:    Coronary angiogram 08/06/2018: normal coronary arteries. Moderate pulmonary hypertension due to AS with elevated PW. Preserved CO and CI.  TAVR 09/03/2018: Edwards Sapien 3 THV (size 23 mm, model # 9600TFX, serial # [id number]) by Lilly C[place] and Darlina Guys.  CXR PA/LAT 11/07/2018:  Cardiac shadow is enlarged but stable. Changes of prior TAVR ar again noted. Aortic calcifications are seen. Previously noted left upper lobe pneumonia has resolved in the interval. No focal infiltrate or sizable effusion is seen. Changes of prior vertebral augmentation are noted.  Carotid artery duplex 01/05/2021: Minimal stenosis in the right internal carotid artery (1-15%). Stenosis in the right external carotid artery (<50%). Duplex suggests stenosis in the left internal carotid artery (50-69%). Stenosis in the left external carotid artery (<50%). Antegrade right vertebral artery flow. Antegrade left vertebral artery flow. Follow up in six months is appropriate if clinically indicated.  No significant change since 06/23/2020.  ECHOCARDIOGRAM COMPLETE 04/03/2022: 1. Left ventricular ejection fraction, by estimation, is 60 to 65%. The  left  ventricle has normal function. The left ventricle has no regional  wall motion abnormalities. There is moderate left ventricular hypertrophy.  Left ventricular diastolic  parameters are consistent with Grade II diastolic dysfunction  (pseudonormalization).   2. Right ventricular systolic function is normal. The right ventricular  size is normal. Tricuspid regurgitation signal is inadequate for assessing  PA pressure.   3. Left atrial size was moderately dilated.   4. The mitral valve is degenerative. Trivial mitral valve regurgitation.  Moderate mitral annular calcification.   5. Aortic valve regurgitation is not visualized. Procedure Date:  09/03/2018.   6. Aortic no signficant ascending aortic aneurysm.   7. The inferior vena cava  is normal in size with greater than 50%  respiratory variability, suggesting right atrial pressure of 3 mmHg.   Comparison(s): No significant change from prior study.   EKG  ***   EKG 01/24/2022: Normal sinus rhythm at rate of 84 bpm, no evidence of ischemia, normal EKG. no significant change from 01/09/2021.   Assessment:   No diagnosis found. No orders of the defined types were placed in this encounter.   There are no discontinued medications.   Recommendations:   Mrs. Arrington Bencomo is a fairly active 88 y.o. Caucasian female with history of possibly mild coronary spasm by coronary angiogram in 2014,  severe AS s/p TAVR by Dr. Roxy Manns on 09/03/2018, asymptomatic bilateral carotid artery stenosis,  essential hypertension, hyperlipidemia, hyperglycemia and GERD.  Patient was admitted 04/03/2022 - 04/05/2022 with acute on chronic diastolic heart failure and hyponatremia (likely dilutional), as well as AKI.  Patient was diuresed and discharged on the following medications from a cardiac standpoint: Aspirin, carvedilol, Farxiga, losartan, pravastatin, torsemide 20 mg p.o. daily.  Patient now presents for hospital follow-up.***  ***Needs BMP   She is here  on annual office visit and follow-up. Her EKG is normal, physical examination reveals the left carotid bruit slightly more prominent.  Prosthetic AV is functioning normally. Dyspnea is class I-II, no chf on exam.  SPECT her dyspnea is related to deconditioning.  I have again encouraged her to start an exercise program.  Will schedule for annual echo for assessment of TAVR.    Carotid artery stenosis is remained stable, will order carotid ultrasound for coronary artery stenosis.  I simply reassured her.  She is on appropriate medical therapy.  I reviewed her external labs.  Lipids are at goal, normal renal function and good BP control.  I will see her back on 6 month later.    Alethia Berthold, MD, [facility] 04/12/2022, 2:37 PM Office: [phone]

## 2022-04-12 NOTE — Progress Notes (Signed)
You are seeing her tomorrow. Just FYI you may want to do your homework as she was in the hospital recently

## 2022-04-13 ENCOUNTER — Encounter: Payer: Self-pay | Admitting: Student

## 2022-04-13 ENCOUNTER — Ambulatory Visit: Payer: Medicare Other | Admitting: Student

## 2022-04-13 VITALS — BP 118/67 | HR 75 | Temp 98.3°F | Resp 16 | Ht 63.0 in | Wt 170.6 lb

## 2022-04-13 DIAGNOSIS — E871 Hypo-osmolality and hyponatremia: Secondary | ICD-10-CM

## 2022-04-13 DIAGNOSIS — Z952 Presence of prosthetic heart valve: Secondary | ICD-10-CM

## 2022-04-13 DIAGNOSIS — I5032 Chronic diastolic (congestive) heart failure: Secondary | ICD-10-CM

## 2022-04-13 MED ORDER — TORSEMIDE 20 MG PO TABS
20.0000 mg | ORAL_TABLET | Freq: Every day | ORAL | 1 refills | Status: DC | PRN
Start: 2022-04-13 — End: 2022-05-18

## 2022-04-13 NOTE — Patient Instructions (Signed)

## 2022-04-19 ENCOUNTER — Encounter: Payer: Medicare Other | Admitting: Cardiology

## 2022-04-20 LAB — BASIC METABOLIC PANEL
BUN/Creatinine Ratio: 23 (ref 12–28)
BUN: 26 mg/dL (ref 8–27)
CO2: 22 mmol/L (ref 20–29)
Calcium: 8.8 mg/dL (ref 8.7–10.3)
Chloride: 90 mmol/L — ABNORMAL LOW (ref 96–106)
Creatinine, Ser: 1.14 mg/dL — ABNORMAL HIGH (ref 0.57–1.00)
Glucose: 130 mg/dL — ABNORMAL HIGH (ref 70–99)
Potassium: 4.3 mmol/L (ref 3.5–5.2)
Sodium: 126 mmol/L — ABNORMAL LOW (ref 134–144)
eGFR: 47 mL/min/{1.73_m2} — ABNORMAL LOW (ref >59)

## 2022-04-20 LAB — OSMOLALITY, URINE: Osmolality, Ur: 427 mOsmol/kg

## 2022-04-20 LAB — OSMOLALITY: Osmolality Meas: 273 mOsmol/kg — ABNORMAL LOW (ref 280–301)

## 2022-04-20 NOTE — Progress Notes (Signed)
Called patient, Na, LMAM.

## 2022-04-20 NOTE — Addendum Note (Signed)
Addended by: Rosezena Sensor on: 04/20/2022 01:37 PM   Modules accepted: Orders

## 2022-04-21 NOTE — Progress Notes (Signed)
Pt called back, results given but pt still wants to speak with CC

## 2022-04-21 NOTE — Progress Notes (Signed)
Called, patient was busy and requested a call back in 15 minutes.

## 2022-04-21 NOTE — Progress Notes (Signed)
Called patient, questions addressed.

## 2022-05-11 ENCOUNTER — Encounter (INDEPENDENT_AMBULATORY_CARE_PROVIDER_SITE_OTHER): Payer: Medicare Other | Admitting: Ophthalmology

## 2022-05-11 DIAGNOSIS — H353231 Exudative age-related macular degeneration, bilateral, with active choroidal neovascularization: Secondary | ICD-10-CM

## 2022-05-11 DIAGNOSIS — H35033 Hypertensive retinopathy, bilateral: Secondary | ICD-10-CM | POA: Diagnosis not present

## 2022-05-11 DIAGNOSIS — I10 Essential (primary) hypertension: Secondary | ICD-10-CM

## 2022-05-11 DIAGNOSIS — H35372 Puckering of macula, left eye: Secondary | ICD-10-CM | POA: Diagnosis not present

## 2022-05-11 DIAGNOSIS — H43813 Vitreous degeneration, bilateral: Secondary | ICD-10-CM

## 2022-05-15 ENCOUNTER — Emergency Department (HOSPITAL_COMMUNITY): Payer: Medicare Other

## 2022-05-15 ENCOUNTER — Other Ambulatory Visit: Payer: Self-pay

## 2022-05-15 ENCOUNTER — Inpatient Hospital Stay (HOSPITAL_COMMUNITY)
Admission: EM | Admit: 2022-05-15 | Discharge: 2022-05-18 | DRG: 291 | Disposition: A | Discharge status: Home Health | Payer: Medicare Other | Attending: Internal Medicine | Admitting: Internal Medicine

## 2022-05-15 DIAGNOSIS — Z953 Presence of xenogenic heart valve: Secondary | ICD-10-CM

## 2022-05-15 DIAGNOSIS — Z7982 Long term (current) use of aspirin: Secondary | ICD-10-CM

## 2022-05-15 DIAGNOSIS — E039 Hypothyroidism, unspecified: Secondary | ICD-10-CM | POA: Diagnosis present

## 2022-05-15 DIAGNOSIS — D649 Anemia, unspecified: Secondary | ICD-10-CM | POA: Diagnosis present

## 2022-05-15 DIAGNOSIS — Z79890 Hormone replacement therapy (postmenopausal): Secondary | ICD-10-CM

## 2022-05-15 DIAGNOSIS — Z853 Personal history of malignant neoplasm of breast: Secondary | ICD-10-CM

## 2022-05-15 DIAGNOSIS — E871 Hypo-osmolality and hyponatremia: Secondary | ICD-10-CM | POA: Diagnosis present

## 2022-05-15 DIAGNOSIS — I110 Hypertensive heart disease with heart failure: Secondary | ICD-10-CM | POA: Diagnosis not present

## 2022-05-15 DIAGNOSIS — Z882 Allergy status to sulfonamides status: Secondary | ICD-10-CM

## 2022-05-15 DIAGNOSIS — Z79899 Other long term (current) drug therapy: Secondary | ICD-10-CM

## 2022-05-15 DIAGNOSIS — I5032 Chronic diastolic (congestive) heart failure: Secondary | ICD-10-CM | POA: Diagnosis present

## 2022-05-15 DIAGNOSIS — E876 Hypokalemia: Secondary | ICD-10-CM | POA: Diagnosis not present

## 2022-05-15 DIAGNOSIS — K219 Gastro-esophageal reflux disease without esophagitis: Secondary | ICD-10-CM | POA: Diagnosis present

## 2022-05-15 DIAGNOSIS — Z952 Presence of prosthetic heart valve: Secondary | ICD-10-CM

## 2022-05-15 DIAGNOSIS — Z96653 Presence of artificial knee joint, bilateral: Secondary | ICD-10-CM | POA: Diagnosis present

## 2022-05-15 DIAGNOSIS — I5033 Acute on chronic diastolic (congestive) heart failure: Secondary | ICD-10-CM | POA: Diagnosis present

## 2022-05-15 DIAGNOSIS — I509 Heart failure, unspecified: Secondary | ICD-10-CM

## 2022-05-15 DIAGNOSIS — Z7984 Long term (current) use of oral hypoglycemic drugs: Secondary | ICD-10-CM

## 2022-05-15 DIAGNOSIS — Z880 Allergy status to penicillin: Secondary | ICD-10-CM

## 2022-05-15 DIAGNOSIS — Z8249 Family history of ischemic heart disease and other diseases of the circulatory system: Secondary | ICD-10-CM

## 2022-05-15 DIAGNOSIS — H3530 Unspecified macular degeneration: Secondary | ICD-10-CM | POA: Diagnosis present

## 2022-05-15 DIAGNOSIS — E785 Hyperlipidemia, unspecified: Secondary | ICD-10-CM | POA: Diagnosis present

## 2022-05-15 DIAGNOSIS — Z20822 Contact with and (suspected) exposure to covid-19: Secondary | ICD-10-CM | POA: Diagnosis present

## 2022-05-15 LAB — CBC WITH DIFFERENTIAL/PLATELET
Abs Immature Granulocytes: 0 10*3/uL (ref 0.00–0.07)
Basophils Absolute: 0 10*3/uL (ref 0.0–0.1)
Basophils Relative: 0 %
Eosinophils Absolute: 0 10*3/uL (ref 0.0–0.5)
Eosinophils Relative: 0 %
HCT: 22.6 % — ABNORMAL LOW (ref 36.0–46.0)
Hemoglobin: 7.1 g/dL — ABNORMAL LOW (ref 12.0–15.0)
Lymphocytes Relative: 11 %
Lymphs Abs: 0.8 10*3/uL (ref 0.7–4.0)
MCH: 26.3 pg (ref 26.0–34.0)
MCHC: 31.4 g/dL (ref 30.0–36.0)
MCV: 83.7 fL (ref 80.0–100.0)
Monocytes Absolute: 0.6 10*3/uL (ref 0.1–1.0)
Monocytes Relative: 8 %
Neutro Abs: 6.2 10*3/uL (ref 1.7–7.7)
Neutrophils Relative %: 81 %
Platelets: 192 10*3/uL (ref 150–400)
RBC: 2.7 MIL/uL — ABNORMAL LOW (ref 3.87–5.11)
RDW: 21.6 % — ABNORMAL HIGH (ref 11.5–15.5)
WBC: 7.6 10*3/uL (ref 4.0–10.5)
nRBC: 0.3 % — ABNORMAL HIGH (ref 0.0–0.2)
nRBC: 1 /100 WBC — ABNORMAL HIGH

## 2022-05-15 LAB — BASIC METABOLIC PANEL
Anion gap: 8 (ref 5–15)
BUN: 12 mg/dL (ref 8–23)
CO2: 23 mmol/L (ref 22–32)
Calcium: 7.8 mg/dL — ABNORMAL LOW (ref 8.9–10.3)
Chloride: 99 mmol/L (ref 98–111)
Creatinine, Ser: 0.77 mg/dL (ref 0.44–1.00)
GFR, Estimated: >60 mL/min (ref >60)
Glucose, Bld: 108 mg/dL — ABNORMAL HIGH (ref 70–99)
Potassium: 3.6 mmol/L (ref 3.5–5.1)
Sodium: 130 mmol/L — ABNORMAL LOW (ref 135–145)

## 2022-05-15 LAB — BRAIN NATRIURETIC PEPTIDE: B Natriuretic Peptide: 744.7 pg/mL — ABNORMAL HIGH (ref 0.0–100.0)

## 2022-05-15 NOTE — ED Provider Triage Note (Signed)
Emergency Medicine Provider Triage Evaluation Note  [name] , a 88 y.o. female  was evaluated in triage.  Pt complains of shortness of breath.  Patient states he is newly diagnosed with CHF, recently hospitalized discharged with torsemide which was discontinued by her cardiologist this week.  She has been extremely careful about not having any added salt in her diet.  She has been weighing daily and has not had any weight gain but has noticed that she has had more shortness of breath.  She woke up this morning from sleep unable to breathe well, improved when sitting up, increased swelling in the lower extremities.  Called EMS who noted that she sounded like she might have some crackles in her lungs.  She has a dry cough.  Review of Systems  Positive: Shortness of breath Negative: Chest pain  Physical Exam  BP (!) 142/55 (BP Location: Right Arm)   Pulse 74   Temp 98.8 F (37.1 C) (Oral)   Resp 18   SpO2 95%  Gen:   Awake, no distress   Resp:  Normal effort  MSK:   Moves extremities without difficulty  Other:  Pitting edema, crackles in the lung bases  Medical Decision Making  Medically screening exam initiated at 8:06 PM.  Appropriate orders placed.  [place] was informed that the remainder of the evaluation will be completed by another provider, this initial triage assessment does not replace that evaluation, and the importance of remaining in the ED until their evaluation is complete.  Work-up initiated   Margarita Mail, PA-C 05/15/22 2008

## 2022-05-15 NOTE — ED Triage Notes (Signed)
Pt here for shob and bilateral leg swelling since this am, pt was diagnosed w/ CHF approx 6 weeks ago and states she wasn't sent home with a fluid pill. Pt woke up with shob around 0430. Per pt ems came to her house but she didn't get transported to ED.

## 2022-05-15 NOTE — ED Provider Notes (Signed)
[facility] EMERGENCY DEPARTMENT Provider Note   CSN: [id number] Arrival date & time: 05/15/22  1935     History {Add pertinent medical, surgical, social history, OB history to HPI:1} Chief Complaint  Patient presents with   Shortness of Breath   Leg Swelling    Zoe [name] is a 88 y.o. female.  Patient presents to the emergency department for evaluation of shortness of breath.  Patient reports that she was diagnosed with congestive heart failure 6 weeks ago.  She was hospitalized for 5 days.  Since going home, her cardiologist has stopped her diuretics, possibly because of renal function.  She does not think she has gained any weight since discharge but she has noticed shortness of breath over the last 24 hours.  Her legs are swollen.  She has developed a cough.  Patient reports that she was awakened by shortness of breath earlier this morning but did not come to the hospital.       Home Medications Prior to Admission medications   Medication Sig Start Date End Date Taking? Authorizing Provider  Artificial Tear Solution (SOOTHE XP) SOLN Place 1 drop into both eyes every evening.    [provider]  aspirin 81 MG chewable tablet Chew 1 tablet (81 mg total) by mouth daily. Patient taking differently: Chew 81 mg by mouth every evening. 09/05/18   Eileen Stanford, PA-C  carvedilol (COREG) 12.5 MG tablet Take 12.5 mg by mouth 2 (two) times daily with a meal.    [provider]  Cholecalciferol (VITAMIN D3) 2000 units TABS Take 2,000 Units by mouth daily.    [provider]  dapagliflozin propanediol (FARXIGA) 10 MG TABS tablet Take 1 tablet (10 mg total) by mouth daily. 04/07/22   Barton Dubois, MD  esomeprazole (NEXIUM) 20 MG capsule Take 20 mg by mouth daily. 12/06/21   [provider]  ferrous sulfate 325 (65 FE) MG tablet Take 325 mg by mouth every other day.    [provider]  levothyroxine (SYNTHROID,  LEVOTHROID) 100 MCG tablet Take 100 mcg by mouth daily before breakfast. 12/29/15   [provider]  losartan (COZAAR) 25 MG tablet Take 0.5 tablets (12.5 mg total) by mouth daily. 04/07/22   Barton Dubois, MD  Multiple Vitamins-Minerals (PRESERVISION AREDS 2) CAPS Take 1 capsule by mouth 2 (two) times daily.    [provider]  pravastatin (PRAVACHOL) 40 MG tablet Take 40 mg by mouth every evening.    [provider]  torsemide (DEMADEX) 20 MG tablet Take 1 tablet (20 mg total) by mouth daily as needed Lamar Benes gain, swelling, shortness of breath). 04/13/22   Cantwell, Celeste C, PA-C      Allergies    Penicillins and Sulfa antibiotics    Review of Systems   Review of Systems  Physical Exam Updated Vital Signs BP (!) 142/55 (BP Location: Right Arm)   Pulse 74   Temp 98.8 F (37.1 C) (Oral)   Resp 18   SpO2 95%  Physical Exam Vitals and nursing note reviewed.  Constitutional:      General: She is not in acute distress.    Appearance: She is well-developed.  HENT:     Head: Normocephalic and atraumatic.     Mouth/Throat:     Mouth: Mucous membranes are moist.  Eyes:     General: Vision grossly intact. Gaze aligned appropriately.     Extraocular Movements: Extraocular movements intact.     Conjunctiva/sclera: Conjunctivae normal.  Cardiovascular:     Rate and Rhythm: Normal rate and regular rhythm.     Pulses: Normal pulses.     Heart sounds: S1 normal and S2 normal. No murmur heard.    No friction rub. Gallop present. S4 sounds present.  Pulmonary:     Effort: Pulmonary effort is normal. No respiratory distress.     Breath sounds: Normal breath sounds.  Abdominal:     General: Bowel sounds are normal.     Palpations: Abdomen is soft.     Tenderness: There is no abdominal tenderness. There is no guarding or rebound.     Hernia: No hernia is present.  Musculoskeletal:        General: No swelling.     Cervical back: Full passive range of motion  without pain, normal range of motion and neck supple. No spinous process tenderness or muscular tenderness. Normal range of motion.     Right lower leg: 1+ Edema present.     Left lower leg: 1+ Edema present.  Skin:    General: Skin is warm and dry.     Capillary Refill: Capillary refill takes less than 2 seconds.     Findings: No ecchymosis, erythema, rash or wound.  Neurological:     General: No focal deficit present.     Mental Status: She is alert and oriented to person, place, and time.     GCS: GCS eye subscore is 4. GCS verbal subscore is 5. GCS motor subscore is 6.     Cranial Nerves: Cranial nerves 2-12 are intact.     Sensory: Sensation is intact.     Motor: Motor function is intact.     Coordination: Coordination is intact.  Psychiatric:        Attention and Perception: Attention normal.        Mood and Affect: Mood normal.        Speech: Speech normal.        Behavior: Behavior normal.     ED Results / Procedures / Treatments   Labs (all labs ordered are listed, but only abnormal results are displayed) Labs Reviewed  BASIC METABOLIC PANEL - Abnormal; Notable for the following components:      Result Value   Sodium 130 (*)    Glucose, Bld 108 (*)    Calcium 7.8 (*)    All other components within normal limits  CBC WITH DIFFERENTIAL/PLATELET - Abnormal; Notable for the following components:   RBC 2.70 (*)    Hemoglobin 7.1 (*)    HCT 22.6 (*)    RDW 21.6 (*)    nRBC 0.3 (*)    nRBC 1 (*)    All other components within normal limits  BRAIN NATRIURETIC PEPTIDE - Abnormal; Notable for the following components:   B Natriuretic Peptide 744.7 (*)    All other components within normal limits    EKG None  Radiology DG Chest 2 View  Result Date: 05/15/2022 CLINICAL DATA:  Shortness of breath, cough EXAM: CHEST - 2 VIEW COMPARISON:  04/02/2022 FINDINGS: Mild central right lower lobe and lingular opacity, atelectasis versus pneumonia. Mild left basilar opacity,  likely atelectasis/scarring. No pleural effusion or pneumothorax. Cardiomegaly.  Status post TAVR.  Thoracic aortic atherosclerosis. Degenerative changes of the visualized thoracolumbar spine. IMPRESSION: Mild lingular and right lower lobe opacity, atelectasis versus pneumonia. Electronically Signed   By: Julian Hy M.D.   On: 05/15/2022 20:56    Procedures Procedures  {Document cardiac monitor, telemetry assessment procedure  when appropriate:1}  Medications Ordered in ED Medications - No data to display  ED Course/ Medical Decision Making/ A&P                           Medical Decision Making  ***  {Document critical care time when appropriate:1} {Document review of labs and clinical decision tools ie heart score, Chads2Vasc2 etc:1}  {Document your independent review of radiology images, and any outside records:1} {Document your discussion with family members, caretakers, and with consultants:1} {Document social determinants of health affecting pt's care:1} {Document your decision making why or why not admission, treatments were needed:1} Final Clinical Impression(s) / ED Diagnoses Final diagnoses:  None    Rx / DC Orders ED Discharge Orders     None

## 2022-05-16 ENCOUNTER — Encounter (HOSPITAL_COMMUNITY): Payer: Self-pay | Admitting: Internal Medicine

## 2022-05-16 ENCOUNTER — Ambulatory Visit: Payer: Medicare Other | Admitting: Student

## 2022-05-16 DIAGNOSIS — Z952 Presence of prosthetic heart valve: Secondary | ICD-10-CM

## 2022-05-16 DIAGNOSIS — Z96653 Presence of artificial knee joint, bilateral: Secondary | ICD-10-CM | POA: Diagnosis present

## 2022-05-16 DIAGNOSIS — E039 Hypothyroidism, unspecified: Secondary | ICD-10-CM | POA: Diagnosis present

## 2022-05-16 DIAGNOSIS — I5032 Chronic diastolic (congestive) heart failure: Secondary | ICD-10-CM | POA: Diagnosis present

## 2022-05-16 DIAGNOSIS — Z853 Personal history of malignant neoplasm of breast: Secondary | ICD-10-CM | POA: Diagnosis not present

## 2022-05-16 DIAGNOSIS — D649 Anemia, unspecified: Secondary | ICD-10-CM | POA: Diagnosis present

## 2022-05-16 DIAGNOSIS — Z7984 Long term (current) use of oral hypoglycemic drugs: Secondary | ICD-10-CM | POA: Diagnosis not present

## 2022-05-16 DIAGNOSIS — Z880 Allergy status to penicillin: Secondary | ICD-10-CM | POA: Diagnosis not present

## 2022-05-16 DIAGNOSIS — I5033 Acute on chronic diastolic (congestive) heart failure: Secondary | ICD-10-CM | POA: Diagnosis present

## 2022-05-16 DIAGNOSIS — Z882 Allergy status to sulfonamides status: Secondary | ICD-10-CM | POA: Diagnosis not present

## 2022-05-16 DIAGNOSIS — Z20822 Contact with and (suspected) exposure to covid-19: Secondary | ICD-10-CM | POA: Diagnosis present

## 2022-05-16 DIAGNOSIS — H3530 Unspecified macular degeneration: Secondary | ICD-10-CM | POA: Diagnosis present

## 2022-05-16 DIAGNOSIS — E871 Hypo-osmolality and hyponatremia: Secondary | ICD-10-CM | POA: Diagnosis not present

## 2022-05-16 DIAGNOSIS — Z79899 Other long term (current) drug therapy: Secondary | ICD-10-CM | POA: Diagnosis not present

## 2022-05-16 DIAGNOSIS — K219 Gastro-esophageal reflux disease without esophagitis: Secondary | ICD-10-CM | POA: Diagnosis present

## 2022-05-16 DIAGNOSIS — I110 Hypertensive heart disease with heart failure: Secondary | ICD-10-CM | POA: Diagnosis present

## 2022-05-16 DIAGNOSIS — E785 Hyperlipidemia, unspecified: Secondary | ICD-10-CM | POA: Diagnosis present

## 2022-05-16 DIAGNOSIS — Z953 Presence of xenogenic heart valve: Secondary | ICD-10-CM | POA: Diagnosis not present

## 2022-05-16 DIAGNOSIS — Z7982 Long term (current) use of aspirin: Secondary | ICD-10-CM | POA: Diagnosis not present

## 2022-05-16 DIAGNOSIS — Z8249 Family history of ischemic heart disease and other diseases of the circulatory system: Secondary | ICD-10-CM | POA: Diagnosis not present

## 2022-05-16 DIAGNOSIS — E876 Hypokalemia: Secondary | ICD-10-CM | POA: Diagnosis not present

## 2022-05-16 DIAGNOSIS — Z79890 Hormone replacement therapy (postmenopausal): Secondary | ICD-10-CM | POA: Diagnosis not present

## 2022-05-16 LAB — COMPREHENSIVE METABOLIC PANEL
ALT: 12 U/L (ref 0–44)
AST: 19 U/L (ref 15–41)
Albumin: 2 g/dL — ABNORMAL LOW (ref 3.5–5.0)
Alkaline Phosphatase: 47 U/L (ref 38–126)
Anion gap: 8 (ref 5–15)
BUN: 10 mg/dL (ref 8–23)
CO2: 27 mmol/L (ref 22–32)
Calcium: 8 mg/dL — ABNORMAL LOW (ref 8.9–10.3)
Chloride: 97 mmol/L — ABNORMAL LOW (ref 98–111)
Creatinine, Ser: 0.76 mg/dL (ref 0.44–1.00)
GFR, Estimated: >60 mL/min (ref >60)
Glucose, Bld: 97 mg/dL (ref 70–99)
Potassium: 3.2 mmol/L — ABNORMAL LOW (ref 3.5–5.1)
Sodium: 132 mmol/L — ABNORMAL LOW (ref 135–145)
Total Bilirubin: 0.7 mg/dL (ref 0.3–1.2)
Total Protein: 8.5 g/dL — ABNORMAL HIGH (ref 6.5–8.1)

## 2022-05-16 LAB — IRON AND TIBC
Iron: 37 ug/dL (ref 28–170)
Saturation Ratios: 20 % (ref 10.4–31.8)
TIBC: 185 ug/dL — ABNORMAL LOW (ref 250–450)
UIBC: 148 ug/dL

## 2022-05-16 LAB — TECHNOLOGIST SMEAR REVIEW

## 2022-05-16 LAB — RETIC PANEL
Immature Retic Fract: 26 % — ABNORMAL HIGH (ref 2.3–15.9)
RBC.: 2.73 MIL/uL — ABNORMAL LOW (ref 3.87–5.11)
Retic Count, Absolute: 53.8 10*3/uL (ref 19.0–186.0)
Retic Ct Pct: 2 % (ref 0.4–3.1)
Reticulocyte Hemoglobin: 23.6 pg — ABNORMAL LOW (ref >27.9)

## 2022-05-16 LAB — MAGNESIUM: Magnesium: 1.6 mg/dL — ABNORMAL LOW (ref 1.7–2.4)

## 2022-05-16 LAB — HEMOGLOBIN AND HEMATOCRIT, BLOOD
HCT: 22.9 % — ABNORMAL LOW (ref 36.0–46.0)
HCT: 27.3 % — ABNORMAL LOW (ref 36.0–46.0)
Hemoglobin: 7.2 g/dL — ABNORMAL LOW (ref 12.0–15.0)
Hemoglobin: 8.9 g/dL — ABNORMAL LOW (ref 12.0–15.0)

## 2022-05-16 LAB — FOLATE: Folate: 7.6 ng/mL (ref >5.9)

## 2022-05-16 LAB — LACTATE DEHYDROGENASE: LDH: 128 U/L (ref 98–192)

## 2022-05-16 LAB — POC OCCULT BLOOD, ED: Fecal Occult Bld: NEGATIVE

## 2022-05-16 LAB — PREPARE RBC (CROSSMATCH)

## 2022-05-16 LAB — SARS CORONAVIRUS 2 BY RT PCR: SARS Coronavirus 2 by RT PCR: NEGATIVE

## 2022-05-16 LAB — VITAMIN B12: Vitamin B-12: 228 pg/mL (ref 180–914)

## 2022-05-16 MED ORDER — FUROSEMIDE 10 MG/ML IJ SOLN
20.0000 mg | Freq: Once | INTRAMUSCULAR | Status: AC
  Administered 2022-05-16: 20 mg via INTRAVENOUS
  Filled 2022-05-16: qty 2

## 2022-05-16 MED ORDER — FUROSEMIDE 10 MG/ML IJ SOLN
40.0000 mg | Freq: Two times a day (BID) | INTRAMUSCULAR | Status: DC
  Administered 2022-05-16 – 2022-05-17 (×2): 40 mg via INTRAVENOUS
  Filled 2022-05-16 (×2): qty 4

## 2022-05-16 MED ORDER — CARVEDILOL 12.5 MG PO TABS
12.5000 mg | ORAL_TABLET | Freq: Two times a day (BID) | ORAL | Status: DC
  Administered 2022-05-16 – 2022-05-18 (×5): 12.5 mg via ORAL
  Filled 2022-05-16 (×5): qty 1

## 2022-05-16 MED ORDER — HEPARIN SODIUM (PORCINE) 5000 UNIT/ML IJ SOLN
5000.0000 [IU] | Freq: Three times a day (TID) | INTRAMUSCULAR | Status: DC
Start: 2022-05-16 — End: 2022-05-18
  Administered 2022-05-16 – 2022-05-18 (×7): 5000 [IU] via SUBCUTANEOUS
  Filled 2022-05-16 (×7): qty 1

## 2022-05-16 MED ORDER — LEVOTHYROXINE SODIUM 100 MCG PO TABS
100.0000 ug | ORAL_TABLET | Freq: Every day | ORAL | Status: DC
  Administered 2022-05-16 – 2022-05-18 (×3): 100 ug via ORAL
  Filled 2022-05-16 (×3): qty 1

## 2022-05-16 MED ORDER — DAPAGLIFLOZIN PROPANEDIOL 10 MG PO TABS
10.0000 mg | ORAL_TABLET | Freq: Every day | ORAL | Status: DC
  Administered 2022-05-16 – 2022-05-18 (×3): 10 mg via ORAL
  Filled 2022-05-16 (×3): qty 1

## 2022-05-16 MED ORDER — PANTOPRAZOLE SODIUM 40 MG PO TBEC
40.0000 mg | DELAYED_RELEASE_TABLET | Freq: Every day | ORAL | Status: DC
  Administered 2022-05-16 – 2022-05-18 (×3): 40 mg via ORAL
  Filled 2022-05-16 (×3): qty 1

## 2022-05-16 MED ORDER — ASPIRIN 81 MG PO CHEW
81.0000 mg | CHEWABLE_TABLET | Freq: Every day | ORAL | Status: DC
  Administered 2022-05-16 – 2022-05-18 (×3): 81 mg via ORAL
  Filled 2022-05-16 (×3): qty 1

## 2022-05-16 MED ORDER — SODIUM CHLORIDE 0.9 % IV SOLN
1.0000 g | Freq: Once | INTRAVENOUS | Status: AC
  Administered 2022-05-16: 1 g via INTRAVENOUS
  Filled 2022-05-16: qty 10

## 2022-05-16 MED ORDER — ACETAMINOPHEN 325 MG PO TABS: 650.0000 mg | ORAL_TABLET | Freq: Four times a day (QID) | ORAL | Status: DC | PRN

## 2022-05-16 MED ORDER — POTASSIUM CHLORIDE CRYS ER 20 MEQ PO TBCR
40.0000 meq | EXTENDED_RELEASE_TABLET | Freq: Two times a day (BID) | ORAL | Status: DC
  Administered 2022-05-16 – 2022-05-18 (×4): 40 meq via ORAL
  Filled 2022-05-16 (×5): qty 2

## 2022-05-16 MED ORDER — PRAVASTATIN SODIUM 40 MG PO TABS
40.0000 mg | ORAL_TABLET | Freq: Every evening | ORAL | Status: DC
  Administered 2022-05-16 – 2022-05-17 (×2): 40 mg via ORAL
  Filled 2022-05-16 (×2): qty 1

## 2022-05-16 MED ORDER — ONDANSETRON HCL 4 MG/2ML IJ SOLN: 4.0000 mg | Freq: Four times a day (QID) | INTRAMUSCULAR | Status: DC | PRN

## 2022-05-16 MED ORDER — MAGNESIUM SULFATE 2 GM/50ML IV SOLN
2.0000 g | Freq: Once | INTRAVENOUS | Status: AC
Start: 2022-05-16 — End: 2022-05-16
  Administered 2022-05-16: 2 g via INTRAVENOUS
  Filled 2022-05-16: qty 50

## 2022-05-16 MED ORDER — FUROSEMIDE 10 MG/ML IJ SOLN: 20.0000 mg | Freq: Two times a day (BID) | INTRAMUSCULAR | Status: DC

## 2022-05-16 MED ORDER — SODIUM CHLORIDE 0.9 % IV SOLN
500.0000 mg | Freq: Once | INTRAVENOUS | Status: AC
  Administered 2022-05-16: 500 mg via INTRAVENOUS
  Filled 2022-05-16: qty 5

## 2022-05-16 MED ORDER — POTASSIUM CHLORIDE CRYS ER 20 MEQ PO TBCR
40.0000 meq | EXTENDED_RELEASE_TABLET | Freq: Every day | ORAL | Status: DC
  Administered 2022-05-16: 40 meq via ORAL
  Filled 2022-05-16: qty 2

## 2022-05-16 MED ORDER — SODIUM CHLORIDE 0.9% IV SOLUTION: Freq: Once | INTRAVENOUS | Status: DC

## 2022-05-16 MED ORDER — ONDANSETRON HCL 4 MG PO TABS: 4.0000 mg | ORAL_TABLET | Freq: Four times a day (QID) | ORAL | Status: DC | PRN

## 2022-05-16 MED ORDER — CYCLOBENZAPRINE HCL 10 MG PO TABS
5.0000 mg | ORAL_TABLET | Freq: Once | ORAL | Status: AC
Start: 2022-05-16 — End: 2022-05-16
  Administered 2022-05-16: 5 mg via ORAL
  Filled 2022-05-16: qty 1

## 2022-05-16 MED ORDER — ACETAMINOPHEN 650 MG RE SUPP: 650.0000 mg | Freq: Four times a day (QID) | RECTAL | Status: DC | PRN

## 2022-05-16 NOTE — Progress Notes (Signed)
Heart Failure Navigator Progress Note  Assessed for Heart & Vascular TOC clinic readiness.  Patient does not meet criteria due to [facility] patient.     Earnestine Leys, BSN, Clinical cytogeneticist Only

## 2022-05-16 NOTE — Subjective & Objective (Signed)
Chief complaint: Shortness of breath  history present illness: 88 year old female history of diastolic heart failure, hyponatremia, anemia, hypertension, history of TAVR, presents to the ER today with worsening shortness of breath.  She was admitted last month for hyponatremia.  She has a known history of diastolic heart failure.  She was seen by cardiology.  She was started on Farxiga and torsemide.  She was followed up in clinic about a week after she was discharged and her torsemide was discontinued due to worsening renal insufficiency.  She had repeat labs which showed that she had improvement in her serum creatinine but her diuretics were not restarted.  Patient states that she is not following a low-salt diet and watching how much fluid she drinks.  She has been weighing herself every day.  She states that her last weight was 166.2 pounds.  Yesterday morning, she woke up about 4:30 AM due to shortness of breath.  She states that she has urinary incontinence from her prolapsed bladder and is up and down all night urinating.  She states that after she went to the bathroom, she was extremely winded.  She called her family later on in the afternoon and her family decided call [phone] due to the patient's shortness of breath.  Patient presented to the ER today with complaints of shortness of breath.  Admission vital signs temp 98.8 heart rate 74 blood pressure 142/55 satting 95% on room air.  Chest x-ray showed some mild atelectasis.  EDP started patient on antibiotics for presumed pneumonia.  I do not think the patient has pneumonia.  She has not had any fever.  White count is normal.  BNP was elevated at 744.  A month ago when she was admitted it was 336.  Sodium is 130, creatinine 0.77, BUN of 12  Code hospitalist contacted for admission.

## 2022-05-16 NOTE — Assessment & Plan Note (Signed)
Stable

## 2022-05-16 NOTE — ED Notes (Signed)
Breakfast order placed ?

## 2022-05-16 NOTE — Assessment & Plan Note (Addendum)
Renal function stable with serum cr at 0,98, K is 4,2 and Na 132.  Plan to continue diuresis with oral furosemide and dapagliflozin Follow up renal function in am.  Avoid hypotension and nephrotoxic medications.

## 2022-05-16 NOTE — Progress Notes (Addendum)
Patient seen and examined, admitted earlier this morning by Dr. Bridgett Larsson, please see his H&P for details briefly Ms. Cogan is a 88/F with history of chronic diastolic CHF, TAVR, chronic anemia, hypertension, hyponatremia admitted last month for CHF and hyponatremia, seen by cardiology discharged on Farxiga and torsemide, after follow-up in cardiology clinic her torsemide dose was discontinued on account of mild worsening creatinine.  Woke up 7/24 early a.m. with shortness of breath, family has noticed dyspnea on exertion for past 4 to 5 days. -In the ED vitals were stable, chest x-ray noted atelectasis, BNP 744  Acute on chronic diastolic CHF -Echo in June with preserved EF -Torsemide was discontinued few weeks ago -Continue IV Lasix, increased dose to 40 Mg twice daily -Continue Farxiga -followed by Dr.Ganji  Acute on chronic normocytic anemia -Hemoglobin down to 7.1 today, baseline in the 8-10 range -Anemia panel was normal last month, has not had a colonoscopy in 10 years on account of her age -will transfuse 1 unit of PRBC -Check B12, LDH, folate, peripheral smear and reticulocyte panel  Hyponatremia -Likely secondary to volume overloaded state, monitor with diuresis  Hypomagnesemia -replace  Hypothyroidism -Continue Synthroid  History of TAVR  Domenic Polite MD

## 2022-05-16 NOTE — Assessment & Plan Note (Signed)
Stable.  Continue Synthroid 100 mcg daily. 

## 2022-05-16 NOTE — ED Notes (Addendum)
Pt in room A&O 4 with visitor. Educated on medication administration. Pt reports having leg cramping through the night but now feeling okay. Dry non productive cough witnessed by nurse. Denies any pain. MD at bedside.

## 2022-05-16 NOTE — ED Notes (Signed)
Patient took off blood pressure cuff.

## 2022-05-16 NOTE — Assessment & Plan Note (Addendum)
Patient's hemoglobin is in the eights when she was admitted to the hospital last month.  Today it is now 7.1.  Hemoccult is negative.  Her iron studies were normal last month.  She takes iron every other day.  She denies any bloody stools.  She has not had a colonoscopy in a while since she is 80.  Her hemoglobin 7.1 may be delusional due to her hypervolemic status.  We will repeat her hemoglobin this afternoon after she has been diuresed some.  We will go ahead and type and screen her.  I think part of her symptomatic dyspnea on exertion, hyponatremia may be related to profound anemia.  I think she may feel better with her hemoglobin closer to 9 or 10.  Patient is agreeable to transfusion if needed.  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

## 2022-05-16 NOTE — ED Notes (Signed)
Patient was readjusted in bed and given water. Patient was placed back on 2L [place] due to oxygen saturation being 87-89%.

## 2022-05-16 NOTE — ED Notes (Signed)
Patient started on 2L [place] due to oxygen saturation being 89%

## 2022-05-16 NOTE — H&P (Addendum)
History and Physical    [name] AST:[id number] DOB: 1934/10/14 DOA: 05/15/2022  DOS: the patient was seen and examined on 05/15/2022  PCP: [facility] [place]   Patient coming from: Home  I have personally briefly reviewed patient's old medical records in [place]  Chief complaint: Shortness of breath  history present illness: 88 year old female history of diastolic heart failure, hyponatremia, anemia, hypertension, history of TAVR, presents to the ER today with worsening shortness of breath.  She was admitted last month for hyponatremia.  She has a known history of diastolic heart failure.  She was seen by cardiology.  She was started on Farxiga and torsemide.  She was followed up in clinic about a week after she was discharged and her torsemide was discontinued due to worsening renal insufficiency.  She had repeat labs which showed that she had improvement in her serum creatinine but her diuretics were not restarted.  Patient states that she is not following a low-salt diet and watching how much fluid she drinks.  She has been weighing herself every day.  She states that her last weight was 166.2 pounds.  Yesterday morning, she woke up about 4:30 AM due to shortness of breath.  She states that she has urinary incontinence from her prolapsed bladder and is up and down all night urinating.  She states that after she went to the bathroom, she was extremely winded.  She called her family later on in the afternoon and her family decided call 911 due to the patient's shortness of breath.  Patient presented to the ER today with complaints of shortness of breath.  Admission vital signs temp 98.8 heart rate 74 blood pressure 142/55 satting 95% on room air.  Chest x-ray showed some mild atelectasis.  EDP started patient on antibiotics for presumed pneumonia.  I do not think the patient has pneumonia.  She has not had any fever.  White count is normal.  BNP was elevated at  744.  A month ago when she was admitted it was 336.  Sodium is 130, creatinine 0.77, BUN of 12  Code hospitalist contacted for admission.   ED Course: BNP elevated at 747, sodium 130  Review of Systems:  Review of Systems  Constitutional: Negative.   HENT: Negative.    Eyes: Negative.   Respiratory:  Positive for shortness of breath.   Cardiovascular:  Positive for leg swelling.  Gastrointestinal: Negative.   Genitourinary: Negative.   Musculoskeletal: Negative.   Skin: Negative.   Neurological: Negative.   Endo/Heme/Allergies: Negative.   Psychiatric/Behavioral: Negative.    All other systems reviewed and are negative.   Past Medical History:  Diagnosis Date   Arthritis    some - per patient   Breast cancer ([place])    breast cancer / left    Cataract    bilat    GERD (gastroesophageal reflux disease)    History of kidney stones    Hyperlipidemia    Hypertension    Hypothyroidism    Macular degeneration    Left   S/P TAVR (transcatheter aortic valve replacement) 09/03/2018   23 mm Edwards Sapien 3 transcatheter heart valve placed via percutaneous right transfemoral approach    Severe aortic stenosis    Stress incontinence    Thyroid disease    Tinnitus     Past Surgical History:  Procedure Laterality Date   ABDOMINAL HYSTERECTOMY  1970's   BACK SURGERY     BREAST LUMPECTOMY  12/1998   lumpectomy  CARDIAC CATHETERIZATION     EYE SURGERY     cataract surgery bilat    INTRAOPERATIVE TRANSTHORACIC ECHOCARDIOGRAM N/A 09/03/2018   Procedure: INTRAOPERATIVE TRANSTHORACIC ECHOCARDIOGRAM;  Surgeon: Burnell Blanks, MD;  Location: [facility];  Service: Open Heart Surgery;  Laterality: N/A;   LITHOTRIPSY     Right total knee     2018 Dr. Alvan Dame   RIGHT/LEFT HEART CATH AND CORONARY ANGIOGRAPHY N/A 08/06/2018   Procedure: RIGHT/LEFT HEART CATH AND CORONARY ANGIOGRAPHY;  Surgeon: Adrian Prows, MD;  Location: [facility];  Service: Cardiovascular;  Laterality: N/A;    THYROIDECTOMY, PARTIAL  1975   TONSILLECTOMY     as a child - patient not sure of exact date   TOTAL KNEE ARTHROPLASTY Left 03/13/2016   Procedure: TOTAL KNEE ARTHROPLASTY;  Surgeon: Paralee Cancel, MD;  Location: [facility];  Service: Orthopedics;  Laterality: Left;   TOTAL KNEE ARTHROPLASTY Right 06/18/2017   Procedure: RIGHT TOTAL KNEE ARTHROPLASTY;  Surgeon: Paralee Cancel, MD;  Location: [facility];  Service: Orthopedics;  Laterality: Right;   TRANSCATHETER AORTIC VALVE REPLACEMENT, TRANSFEMORAL N/A 09/03/2018   Procedure: TRANSCATHETER AORTIC VALVE REPLACEMENT, TRANSFEMORAL;  Surgeon: Burnell Blanks, MD;  Location: [facility];  Service: Open Heart Surgery;  Laterality: N/A;     reports that she has never smoked. She has never used smokeless tobacco. She reports that she does not drink alcohol and does not use drugs.  Allergies  Allergen Reactions   Penicillins Other (See Comments)    UNSPECIFIED REACTION  Patient does not remember reaction.  Has patient had a PCN reaction causing immediate rash, facial/tongue/throat swelling, SOB or lightheadedness with hypotension: no Has patient had a PCN reaction causing severe rash involving mucus membranes or skin necrosis: no Has patient had a PCN reaction that required hospitalization no Has patient had a PCN reaction occurring within the last 10 years: no If all of the above answers are "NO", then may proceed with Cephalosporin use.    Sulfa Antibiotics     UNSPECIFIED REACTION  "maybe vision issues? "    Family History  Problem Relation Age of Onset   Diabetes Mother    Stroke Mother        Carotid artery disease   Heart disease Father        CAD   Coronary artery disease Father    Diabetes Sister     Prior to Admission medications   Medication Sig Start Date End Date Taking? Authorizing Provider  Artificial Tear Solution (SOOTHE XP) SOLN Place 1 drop into both eyes every evening.    [provider]  aspirin 81 MG chewable  tablet Chew 1 tablet (81 mg total) by mouth daily. Patient taking differently: Chew 81 mg by mouth every evening. 09/05/18   Eileen Stanford, PA-C  carvedilol (COREG) 12.5 MG tablet Take 12.5 mg by mouth 2 (two) times daily with a meal.    [provider]  Cholecalciferol (VITAMIN D3) 2000 units TABS Take 2,000 Units by mouth daily.    [provider]  dapagliflozin propanediol (FARXIGA) 10 MG TABS tablet Take 1 tablet (10 mg total) by mouth daily. 04/07/22   Barton Dubois, MD  esomeprazole (NEXIUM) 20 MG capsule Take 20 mg by mouth daily. 12/06/21   [provider]  ferrous sulfate 325 (65 FE) MG tablet Take 325 mg by mouth every other day.    [provider]  levothyroxine (SYNTHROID, LEVOTHROID) 100 MCG tablet Take 100 mcg by mouth daily  before breakfast. 12/29/15   [provider]  losartan (COZAAR) 25 MG tablet Take 0.5 tablets (12.5 mg total) by mouth daily. 04/07/22   Barton Dubois, MD  Multiple Vitamins-Minerals (PRESERVISION AREDS 2) CAPS Take 1 capsule by mouth 2 (two) times daily.    [provider]  pravastatin (PRAVACHOL) 40 MG tablet Take 40 mg by mouth every evening.    [provider]  torsemide (DEMADEX) 20 MG tablet Take 1 tablet (20 mg total) by mouth daily as needed Lamar Benes gain, swelling, shortness of breath). 04/13/22   Alethia Berthold, PA-C    Physical Exam: Vitals:   05/16/22 0100 05/16/22 0200 05/16/22 0300 05/16/22 0309  BP: (!) 170/68 (!) 147/68 (!) 163/60   Pulse: 77 78 80   Resp: 16 (!) 21 (!) 21   Temp:    98.2 F (36.8 C)  TempSrc:    Oral  SpO2: 96% 95% 93%     Physical Exam Vitals reviewed.  Constitutional:      General: She is not in acute distress.    Appearance: Normal appearance. She is not ill-appearing, toxic-appearing or diaphoretic.  HENT:     Head: Normocephalic and atraumatic.     Nose: Nose normal.  Eyes:     General: No scleral icterus. Cardiovascular:     Rate and  Rhythm: Normal rate and regular rhythm.     Pulses: Normal pulses.     Heart sounds: Murmur heard.  Pulmonary:     Effort: Pulmonary effort is normal. No respiratory distress.     Breath sounds: Normal breath sounds. No wheezing or rales.  Abdominal:     General: Bowel sounds are normal. There is no distension.     Tenderness: There is no abdominal tenderness. There is no guarding or rebound.  Musculoskeletal:     Right ankle: Swelling present.     Left ankle: Swelling present.     Comments: Trace to +1 pitting bilateral ankle edema  Skin:    General: Skin is warm and dry.     Capillary Refill: Capillary refill takes less than 2 seconds.  Neurological:     General: No focal deficit present.     Mental Status: She is alert and oriented to person, place, and time.      Labs on Admission: I have personally reviewed following labs and imaging studies  CBC: Recent Labs  Lab 05/15/22 2020  WBC 7.6  NEUTROABS 6.2  HGB 7.1*  HCT 22.6*  MCV 83.7  PLT 443   Basic Metabolic Panel: Recent Labs  Lab 05/15/22 2020  NA 130*  K 3.6  CL 99  CO2 23  GLUCOSE 108*  BUN 12  CREATININE 0.77  CALCIUM 7.8*   GFR: CrCl cannot be calculated (Unknown ideal weight.). Liver Function Tests: No results for input(s): "AST", "ALT", "ALKPHOS", "BILITOT", "PROT", "ALBUMIN" in the last 168 hours. No results for input(s): "LIPASE", "AMYLASE" in the last 168 hours. No results for input(s): "AMMONIA" in the last 168 hours. Coagulation Profile: No results for input(s): "INR", "PROTIME" in the last 168 hours. Cardiac Enzymes: No results for input(s): "CKTOTAL", "CKMB", "CKMBINDEX", "TROPONINI", "TROPONINIHS" in the last 168 hours. BNP (last 3 results) No results for input(s): "PROBNP" in the last 8760 hours. HbA1C: No results for input(s): "HGBA1C" in the last 72 hours. CBG: No results for input(s): "GLUCAP" in the last 168 hours. Lipid Profile: No results for input(s): "CHOL", "HDL",  "LDLCALC", "TRIG", "CHOLHDL", "LDLDIRECT" in the last 72 hours. Thyroid  Function Tests: No results for input(s): "TSH", "T4TOTAL", "FREET4", "T3FREE", "THYROIDAB" in the last 72 hours. Anemia Panel: No results for input(s): "VITAMINB12", "FOLATE", "FERRITIN", "TIBC", "IRON", "RETICCTPCT" in the last 72 hours. Urine analysis:    Component Value Date/Time   COLORURINE STRAW (A) 04/02/2022 1829   APPEARANCEUR CLEAR 04/02/2022 1829   LABSPEC 1.005 04/02/2022 1829   PHURINE 7.0 04/02/2022 1829   GLUCOSEU NEGATIVE 04/02/2022 1829   HGBUR NEGATIVE 04/02/2022 1829   BILIRUBINUR NEGATIVE 04/02/2022 1829   KETONESUR NEGATIVE 04/02/2022 1829   PROTEINUR NEGATIVE 04/02/2022 1829   NITRITE NEGATIVE 04/02/2022 1829   LEUKOCYTESUR TRACE (A) 04/02/2022 1829    Radiological Exams on Admission: I have personally reviewed images DG Chest 2 View  Result Date: 05/15/2022 CLINICAL DATA:  Shortness of breath, cough EXAM: CHEST - 2 VIEW COMPARISON:  04/02/2022 FINDINGS: Mild central right lower lobe and lingular opacity, atelectasis versus pneumonia. Mild left basilar opacity, likely atelectasis/scarring. No pleural effusion or pneumothorax. Cardiomegaly.  Status post TAVR.  Thoracic aortic atherosclerosis. Degenerative changes of the visualized thoracolumbar spine. IMPRESSION: Mild lingular and right lower lobe opacity, atelectasis versus pneumonia. Electronically Signed   By: Julian Hy M.D.   On: 05/15/2022 20:56    EKG: My personal interpretation of EKG shows: NSR    Assessment/Plan Principal Problem:   Acute on chronic diastolic CHF (congestive heart failure) (HCC) Active Problems:   Hyponatremia   Symptomatic anemia   S/P TAVR (transcatheter aortic valve replacement)   Hypothyroidism    Assessment and Plan: * Acute on chronic diastolic CHF (congestive heart failure) ([place]) Assigned observation telemetry bed.  Continue with diuresis with IV Lasix.  I wonder if her CHF exacerbation is due  to recent discontinuation of her torsemide when she was seen by cardiology in June.  Reading the note, it appears that diuretic therapy was withheld due to worsening renal insufficiency.  However there was no plan on restarting it if her renal function has improved.  We will continue with IV Lasix 20 mg twice daily.  Monitor her serum creatinine.  Hold her losartan for now.  She continues on [name].  She denies a history of diabetes.  I also wonder if part of her fluid retention is due to her anemia.  Symptomatic anemia Patient's hemoglobin is in the eights when she was admitted to the hospital last month.  Today it is now 7.1.  Hemoccult is negative.  Her iron studies were normal last month.  She takes iron every other day.  She denies any bloody stools.  She has not had a colonoscopy in a while since she is 56.  Her hemoglobin 7.1 may be delusional due to her hypervolemic status.  We will repeat her hemoglobin this afternoon after she has been diuresed some.  We will go ahead and type and screen her.  I think part of her symptomatic dyspnea on exertion, hyponatremia may be related to profound anemia.  I think she may feel better with her hemoglobin closer to 9 or 10.  Patient is agreeable to transfusion if needed.  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  Hyponatremia Chronic now.  Originally it was thought that her HCTZ was the cause of her hyponatremia.  She received a dose of IV Lasix so checking her urine osmolality and urine sodium at this point would not be productive.  I also wonder if she is hyponatremic due to water retention from her anemia.  TSH and cortisol levels were normal last month.  Hypothyroidism  Stable.  Continue Synthroid 100 mcg daily.  S/P TAVR (transcatheter aortic valve replacement) Stable.   DVT prophylaxis: SQ Heparin Code Status: Full Code verified with pt Family Communication: no family at bedside  Disposition Plan: return home  Consults called: none  Admission  status: Observation, Telemetry bed   Kristopher Oppenheim, DO [facility] 05/16/2022, 4:08 AM

## 2022-05-16 NOTE — TOC CM/SW Note (Signed)
RNCM received inbound call from [facility] with [facility] regarding patient. Patient's son Legrand Como has contacted Tommi Rumps to request services again. Patient was with [facility] in the past. Currently patient is being admitted however will need [place] orders at discharge to include SW, PT which [facility] is accepting patient. Patient's husband  recently passed and needs SW to assist with understanding survivorship benefits, as husband was service connected.   TOC to contact Tommi Rumps with [place] at discharge.

## 2022-05-16 NOTE — Assessment & Plan Note (Addendum)
Echocardiogram with EF 60 to 65%. Moderate LVH, RV systolic function preserved. No able to assess RV systolic pressure. No significant valvular disease.   Urine output 2,637 ml Systolic blood pressure 858 to 155 mmHg.   Patient more euvolemic, will transition to oral diuretic therapy. Continue carvedilol and dapagliflozin.

## 2022-05-17 DIAGNOSIS — D649 Anemia, unspecified: Secondary | ICD-10-CM | POA: Diagnosis not present

## 2022-05-17 DIAGNOSIS — I5033 Acute on chronic diastolic (congestive) heart failure: Secondary | ICD-10-CM | POA: Diagnosis not present

## 2022-05-17 DIAGNOSIS — E039 Hypothyroidism, unspecified: Secondary | ICD-10-CM | POA: Diagnosis not present

## 2022-05-17 DIAGNOSIS — E871 Hypo-osmolality and hyponatremia: Secondary | ICD-10-CM | POA: Diagnosis not present

## 2022-05-17 LAB — CBC WITH DIFFERENTIAL/PLATELET
Abs Immature Granulocytes: 0.09 10*3/uL — ABNORMAL HIGH (ref 0.00–0.07)
Basophils Absolute: 0 10*3/uL (ref 0.0–0.1)
Basophils Relative: 0 %
Eosinophils Absolute: 0.1 10*3/uL (ref 0.0–0.5)
Eosinophils Relative: 1 %
HCT: 26.7 % — ABNORMAL LOW (ref 36.0–46.0)
Hemoglobin: 8.6 g/dL — ABNORMAL LOW (ref 12.0–15.0)
Immature Granulocytes: 1 %
Lymphocytes Relative: 16 %
Lymphs Abs: 1.2 10*3/uL (ref 0.7–4.0)
MCH: 27 pg (ref 26.0–34.0)
MCHC: 32.2 g/dL (ref 30.0–36.0)
MCV: 83.7 fL (ref 80.0–100.0)
Monocytes Absolute: 1.2 10*3/uL — ABNORMAL HIGH (ref 0.1–1.0)
Monocytes Relative: 15 %
Neutro Abs: 5.4 10*3/uL (ref 1.7–7.7)
Neutrophils Relative %: 67 %
Platelets: 186 10*3/uL (ref 150–400)
RBC: 3.19 MIL/uL — ABNORMAL LOW (ref 3.87–5.11)
RDW: 21.2 % — ABNORMAL HIGH (ref 11.5–15.5)
Smear Review: NORMAL
WBC: 8 10*3/uL (ref 4.0–10.5)
nRBC: 0.4 % — ABNORMAL HIGH (ref 0.0–0.2)

## 2022-05-17 LAB — TYPE AND SCREEN
ABO/RH(D): O POS
Antibody Screen: NEGATIVE
Unit division: 0

## 2022-05-17 LAB — BASIC METABOLIC PANEL
Anion gap: 9 (ref 5–15)
BUN: 17 mg/dL (ref 8–23)
CO2: 26 mmol/L (ref 22–32)
Calcium: 8.1 mg/dL — ABNORMAL LOW (ref 8.9–10.3)
Chloride: 97 mmol/L — ABNORMAL LOW (ref 98–111)
Creatinine, Ser: 0.98 mg/dL (ref 0.44–1.00)
GFR, Estimated: 56 mL/min — ABNORMAL LOW (ref >60)
Glucose, Bld: 109 mg/dL — ABNORMAL HIGH (ref 70–99)
Potassium: 4.2 mmol/L (ref 3.5–5.1)
Sodium: 132 mmol/L — ABNORMAL LOW (ref 135–145)

## 2022-05-17 LAB — BPAM RBC
Blood Product Expiration Date: 202308262359
ISSUE DATE / TIME: 202307251552
Unit Type and Rh: 5100

## 2022-05-17 LAB — GLUCOSE, CAPILLARY: Glucose-Capillary: 114 mg/dL — ABNORMAL HIGH (ref 70–99)

## 2022-05-17 MED ORDER — FUROSEMIDE 20 MG PO TABS
20.0000 mg | ORAL_TABLET | Freq: Every day | ORAL | Status: DC
  Administered 2022-05-18: 20 mg via ORAL
  Filled 2022-05-17: qty 1

## 2022-05-17 MED ORDER — FERROUS SULFATE 325 (65 FE) MG PO TABS
325.0000 mg | ORAL_TABLET | ORAL | Status: DC
  Administered 2022-05-17: 325 mg via ORAL
  Filled 2022-05-17 (×2): qty 1

## 2022-05-17 NOTE — Progress Notes (Signed)
Mobility Specialist Progress Note:   05/17/22 1147  Mobility  Activity Ambulated with assistance in hallway  Level of Assistance Standby assist, set-up cues, supervision of patient - no hands on  Assistive Device Cane  Distance Ambulated (ft) 230 ft  Activity Response Tolerated well  $Mobility charge 1 Mobility   Pt received in bed willing to participate in mobility. No complaints of pain. Left EOB with call bell in reach and all needs met.   [facility] [name] Mobility Specialist

## 2022-05-17 NOTE — Hospital Course (Addendum)
[name] was admitted to the hospital with the working diagnosis of decompensated heart failure.   88 yo female with the past medical history of heart failure, hypertension, sp TAVR, and chronic hyponatremia who presented with dyspnea. Recent hospitalization for heart failure 03/2022, discharged on torsemide and SGLT 2 inh. Outpatient follow up one week ago diuretic therapy was transitory held 2 days prior admission due to worsening renal function. She has been compliant with salt and fluid restrictions at home. Noted progressive lower extremity edema. On the day of hospitalization she became acutely dyspnea, worse with minimal efforts, prompting her family to call 911 and patient was transported to the hospital. On her initial physical examination her blood pressure was 170/68, HR 77 and RR 21 with 02 saturation 93%, lungs with no wheezing, heart with S1 and S2 present with systolic murmur, abdomen not distended and positive lower extremity edema.   Na 130, K 3,6 CL 99 bicarbonate 23, glucose 108 bun 12 cr 0,77 Bnp 744 Wbc 7.6 hgb 7,1 plt 192  Sars covid 19 negative   Chest radiograph with mild cardiomegaly with bilateral hilar vascular congestion, bilateral lower lobes atelectasis.   EKG 76 bpm, normal axis, normal intervals, sinus rhythm, with no significant ST segment or T wave changes.   Patient was placed on furosemide and received one unit of PRBC with good toleration.

## 2022-05-17 NOTE — Progress Notes (Addendum)
Pt resting without acute distress. Pt noted to have intermittent confusion, reorients easily. Will cont to monitor. SRP, RN

## 2022-05-17 NOTE — Progress Notes (Addendum)
Progress Note   Patient: Zoe [name] BOF:[id number] DOB: 01/02/1934 DOA: 05/15/2022     1 DOS: the patient was seen and examined on 05/17/2022   Brief hospital course: Zoe [name] was admitted to the hospital with the working diagnosis of decompensated heart failure.   88 yo female with the past medical history of heart failure, hypertension, sp TAVR, and chronic hyponatremia who presented with dyspnea. Recent hospitalization for heart failure 03/2022, discharged on torsemide and SGLT 2 inh. Outpatient follow up one week ago diuretic therapy was transitory held 2 days prior admission due to worsening renal function. She has been compliant with salt and fluid restrictions at home. Noted progressive lower extremity edema. On the day of hospitalization she became acutely dyspnea, worse with minimal efforts, prompting her family to call 911 and patient was transported to the hospital. On her initial physical examination her blood pressure was 170/68, HR 77 and RR 21 with 02 saturation 93%, lungs with no wheezing, heart with S1 and S2 present with systolic murmur, abdomen not distended and positive lower extremity edema.   Na 130, K 3,6 CL 99 bicarbonate 23, glucose 108 bun 12 cr 0,77 Bnp 744 Wbc 7.6 hgb 7,1 plt 192  Sars covid 19 negative   Chest radiograph with mild cardiomegaly with bilateral hilar vascular congestion, bilateral lower lobes atelectasis.   EKG 76 bpm, normal axis, normal intervals, sinus rhythm, with no significant ST segment or T wave changes.   Patient was placed on furosemide and received one unit of PRBC with good toleration.   Assessment and Plan: * Acute on chronic diastolic CHF (congestive heart failure) (HCC) Echocardiogram with EF 60 to 65%. Moderate LVH, RV systolic function preserved. No able to assess RV systolic pressure. No significant valvular disease.   Urine output 7,782 ml Systolic blood pressure 423 to 155 mmHg.   Patient more euvolemic, will  transition to oral diuretic therapy. Continue carvedilol and dapagliflozin.    Hyponatremia Renal function stable with serum cr at 0,98, K is 4,2 and Na 132.  Plan to continue diuresis with oral furosemide and dapagliflozin Follow up renal function in am.  Avoid hypotension and nephrotoxic medications.   Symptomatic anemia Patient had one unit PRBC transfused with good toleration.  Patient known to have iron deficiency.  Iron stores with serum iron 37, TIBC 185 and transferrin saturation 20 with ferritin 67.   Hypothyroidism Stable.  Continue Synthroid 100 mcg daily.  S/P TAVR (transcatheter aortic valve replacement) Stable.        Subjective: Patient is feeling better, no chest pain, dyspnea and edema have improved   Physical Exam: Vitals:   05/16/22 1615 05/16/22 1944 05/16/22 2000 05/17/22 0728  BP: 138/63 (!) 138/58 (!) 132/54 (!) 155/67  Pulse: 75 79 79 71  Resp: '19 19 18 18  '$ Temp: 97.9 F (36.6 C) 97.6 F (36.4 C) 98.1 F (36.7 C) 98.3 F (36.8 C)  TempSrc: Tympanic Oral Oral Oral  SpO2: 91% 93% 93% 96%  Weight:      Height:       Neurology awake and alert ENT with mild pallor Cardiovascular with S1 and S2 present and rhythmic Respiratory with no rales Abdomen not distended  No lower extremity edema  Data Reviewed:    Family Communication: no family at the bedside   Disposition: Status is: Inpatient Remains inpatient appropriate because: recovering heart fialure   Planned Discharge Destination: Home    Author: Tawni Millers, MD 05/17/2022 2:29 PM  For  on call review [email or web address].

## 2022-05-17 NOTE — TOC Progression Note (Signed)
Transition of Care [facility]) - Progression Note    Patient Details  Name: Zoe [name] MRN: [id number] Date of Birth: 02-May-1934  [facility]) CM/SW Contact  Zenon Mayo, RN Phone Number: 05/17/2022, 4:55 PM  Clinical Narrative:    NCM spoke with patient at the bedside, she states she really enjoyed her physical therapist , Shanon Brow  , with [facility] and she wants me to tell this to [facility] and she would like to continue with them.  NCM informed [facility] Rumps of this information, he states she will need HHRN, HHPT, HHAIDE and Education officer, museum. NCM asked MD for these orders for patient.  Patient states her spouse was a veteran , so she would like for [place] to check to see if she could get an aide thru the [place] , This NCM informed her will let the NCM know tomorrow to look into for her.         Expected Discharge Plan and Services                                                 Social Determinants of Health (SDOH) Interventions    Readmission Risk Interventions     No data to display

## 2022-05-18 ENCOUNTER — Encounter (HOSPITAL_COMMUNITY): Payer: Self-pay | Admitting: Internal Medicine

## 2022-05-18 ENCOUNTER — Other Ambulatory Visit (HOSPITAL_COMMUNITY): Payer: Self-pay

## 2022-05-18 DIAGNOSIS — I5033 Acute on chronic diastolic (congestive) heart failure: Secondary | ICD-10-CM | POA: Diagnosis not present

## 2022-05-18 DIAGNOSIS — E871 Hypo-osmolality and hyponatremia: Secondary | ICD-10-CM | POA: Diagnosis not present

## 2022-05-18 DIAGNOSIS — E039 Hypothyroidism, unspecified: Secondary | ICD-10-CM | POA: Diagnosis not present

## 2022-05-18 DIAGNOSIS — D649 Anemia, unspecified: Secondary | ICD-10-CM | POA: Diagnosis not present

## 2022-05-18 LAB — BASIC METABOLIC PANEL
Anion gap: 9 (ref 5–15)
BUN: 30 mg/dL — ABNORMAL HIGH (ref 8–23)
CO2: 23 mmol/L (ref 22–32)
Calcium: 8.4 mg/dL — ABNORMAL LOW (ref 8.9–10.3)
Chloride: 99 mmol/L (ref 98–111)
Creatinine, Ser: 1.14 mg/dL — ABNORMAL HIGH (ref 0.44–1.00)
GFR, Estimated: 47 mL/min — ABNORMAL LOW (ref >60)
Glucose, Bld: 102 mg/dL — ABNORMAL HIGH (ref 70–99)
Potassium: 4.9 mmol/L (ref 3.5–5.1)
Sodium: 131 mmol/L — ABNORMAL LOW (ref 135–145)

## 2022-05-18 LAB — HEMOGLOBIN AND HEMATOCRIT, BLOOD
HCT: 28.1 % — ABNORMAL LOW (ref 36.0–46.0)
Hemoglobin: 8.8 g/dL — ABNORMAL LOW (ref 12.0–15.0)

## 2022-05-18 MED ORDER — POTASSIUM CHLORIDE CRYS ER 10 MEQ PO TBCR: 10.0000 meq | EXTENDED_RELEASE_TABLET | Freq: Every day | ORAL | Status: DC

## 2022-05-18 MED ORDER — POTASSIUM CHLORIDE CRYS ER 10 MEQ PO TBCR
10.0000 meq | EXTENDED_RELEASE_TABLET | Freq: Every day | ORAL | 0 refills | Status: DC
  Filled 2022-05-18: qty 30, 30d supply, fill #0

## 2022-05-18 MED ORDER — FUROSEMIDE 20 MG PO TABS
20.0000 mg | ORAL_TABLET | Freq: Every day | ORAL | 0 refills | Status: DC
  Filled 2022-05-18: qty 30, 30d supply, fill #0

## 2022-05-18 NOTE — Discharge Summary (Signed)
Physician Discharge Summary   Patient: Zoe [name] MRN: [id number] DOB: 1933/11/03  Admit date:     05/15/2022  Discharge date: 05/18/22  Discharge Physician: Jimmy Picket Londen Bok   PCP: Rolley Sims, [place]   Recommendations at discharge:    Patient will continue diuresis with furosemide 20 mg daily along with K supplementation Follow up renal function and electrolytes as outpatient. Instructed to increase furosemide to 40 mg daily in case of weight gain 2 to 3 lbs in 24 hrs or 5 lbs in 7 days.   Discharge Diagnoses: Principal Problem:   Acute on chronic diastolic CHF (congestive heart failure) (HCC) Active Problems:   Hyponatremia   Symptomatic anemia   S/P TAVR (transcatheter aortic valve replacement)   Hypothyroidism  Resolved Problems:   * No resolved hospital problems. [facility] Course: Zoe [name] was admitted to the hospital with the working diagnosis of decompensated heart failure.   88 yo female with the past medical history of heart failure, hypertension, sp TAVR, and chronic hyponatremia who presented with dyspnea. Recent hospitalization for heart failure 03/2022, discharged on torsemide and SGLT 2 inh. Outpatient follow diuretic therapy was held 2 days due to worsening renal function. She has been compliant with salt and fluid restrictions at home. Noted progressive lower extremity edema. On the day of hospitalization she experienced acutely dyspnea, worse with minimal efforts, prompting her family to call 911 and patient was transported to the hospital. On her initial physical examination her blood pressure was 170/68, HR 77 and RR 21 with 02 saturation 93%, lungs with no wheezing, heart with S1 and S2 present with systolic murmur, abdomen not distended and positive lower extremity edema.   Na 130, K 3,6 CL 99 bicarbonate 23, glucose 108 bun 12 cr 0,77 Bnp 744 Wbc 7.6 hgb 7,1 plt 192  Sars covid 19 negative   Chest radiograph with mild  cardiomegaly with bilateral hilar vascular congestion, bilateral lower lobes atelectasis.   EKG 76 bpm, normal axis, normal intervals, sinus rhythm, with no significant ST segment or T wave changes.   Patient was placed on furosemide and received one unit of PRBC with good toleration.  No evidence of acute bleeding or iron deficiency.  At the time of her discharge she will continue diuresis with furosemide. Follow up as outpatient.    Assessment and Plan: * Acute on chronic diastolic CHF (congestive heart failure) (HCC) Echocardiogram with EF 60 to 65%. Moderate LVH, RV systolic function preserved. No able to assess RV systolic pressure. No significant valvular disease.   Patient was placed on IV furosemide for diuresis, negative fluid balance was achieved - 4,155 ml, with improvement in her symptoms.   Patient will continue diuretic therapy with oral furosemide, with instructions to increase in case of signs of volume overload, from 20 to 40 mg.   Continue carvedilol and dapagliflozin.  At discharge resume ARB.    Hyponatremia Patient tolerated well diuresis, at the time of her discharge her renal function had as serum cr of 1.14 with K at 4,9 and serum bicarbonate at 23.   Plan to continue diuresis with furosemide and have close follow up as outpatient.  Add 10 meq Kcl.   Symptomatic anemia Patient had one unit PRBC transfused with good toleration.   Iron stores with serum iron 37, TIBC 185 and transferrin saturation 20 with ferritin 67.  No signs of acute bleeding, plan to follow up cell count as outpatient.   Hypothyroidism Stable.  Continue Synthroid 100  mcg daily.  S/P TAVR (transcatheter aortic valve replacement) Compensated valvular disease. Plan to follow up as outpatient.          Consultants: none  Procedures performed: none  Disposition: Home Diet recommendation:  Cardiac diet DISCHARGE MEDICATION: Allergies as of 05/18/2022       Reactions    Penicillins Other (See Comments)   UNSPECIFIED REACTION  Patient does not remember reaction.  Has patient had a PCN reaction causing immediate rash, facial/tongue/throat swelling, SOB or lightheadedness with hypotension: no Has patient had a PCN reaction causing severe rash involving mucus membranes or skin necrosis: no Has patient had a PCN reaction that required hospitalization no Has patient had a PCN reaction occurring within the last 10 years: no If all of the above answers are "NO", then may proceed with Cephalosporin use.   Sulfa Antibiotics Other (See Comments)   UNSPECIFIED REACTION  "maybe vision issues? "        Medication List     STOP taking these medications    hydrochlorothiazide 12.5 MG tablet Commonly known as: HYDRODIURIL   torsemide 20 MG tablet Commonly known as: DEMADEX   valsartan 320 MG tablet Commonly known as: DIOVAN       TAKE these medications    aspirin 81 MG chewable tablet Chew 1 tablet (81 mg total) by mouth daily. What changed: when to take this   carvedilol 12.5 MG tablet Commonly known as: COREG Take 12.5 mg by mouth 2 (two) times daily with a meal.   dapagliflozin propanediol 10 MG Tabs tablet Commonly known as: FARXIGA Take 1 tablet (10 mg total) by mouth daily.   esomeprazole 20 MG capsule Commonly known as: NEXIUM Take 20 mg by mouth daily.   ferrous sulfate 325 (65 FE) MG tablet Take 325 mg by mouth every other day.   furosemide 20 MG tablet Commonly known as: LASIX Take 1 tablet (20 mg total) by mouth daily. In case of weight gain 2 to 3 lbs in 24 hrs or 5 lbs in 7 days, shortness of breath or leg swelling take 2 tablets daily. Start taking on: May 19, 2022   levothyroxine 100 MCG tablet Commonly known as: SYNTHROID Take 100 mcg by mouth daily before breakfast.   losartan 25 MG tablet Commonly known as: COZAAR Take 0.5 tablets (12.5 mg total) by mouth daily.   potassium chloride 10 MEQ tablet Commonly known  as: KLOR-CON M Take 1 tablet (10 mEq total) by mouth daily. Start taking on: May 19, 2022   pravastatin 40 MG tablet Commonly known as: PRAVACHOL Take 40 mg by mouth every evening.   PreserVision AREDS 2 Caps Take 1 capsule by mouth 2 (two) times daily.   Soothe XP Soln Place 1 drop into both eyes every evening.   Vitamin D3 50 MCG (2000 UT) Tabs Take 2,000 Units by mouth daily.        Follow-up Information     [facility] Follow up.   Specialty: Home Health Services Why: Agency will contact you with apt times. Contact information: [place] [address] [phone]         [facility] [place] on 05/24/2022.   Specialty: Rheumatology Why: '@4'$ :00pm Contact information: [place] [address] [phone]                Discharge Exam: Filed Weights   05/16/22 1500 05/18/22 0343  Weight: 78.2 kg 73.9 kg   BP Marland Kitchen)  120/55 (BP Location: Right Arm)   Pulse 75   Temp 97.8 F (36.6 C) (Oral)   Resp 18   Ht 5' 2.99" (1.6 m)   Wt 73.9 kg   SpO2 94%   BMI 28.88 kg/m   Patient with improvement in dyspnea and edema  Neurology awake and alert ENT with no pallor Cardiovascular with S1 and S2 present and rhythmic with no gallops or murmurs No JVD No lower extremity edema Respiratory with no rales or wheezing Abdomen not distended   Condition at discharge: stable  The results of significant diagnostics from this hospitalization (including imaging, microbiology, ancillary and laboratory) are listed below for reference.   Imaging Studies: DG Chest 2 View  Result Date: 05/15/2022 CLINICAL DATA:  Shortness of breath, cough EXAM: CHEST - 2 VIEW COMPARISON:  04/02/2022 FINDINGS: Mild central right lower lobe and lingular opacity, atelectasis versus pneumonia. Mild left basilar opacity, likely atelectasis/scarring. No pleural effusion or pneumothorax. Cardiomegaly.  Status post TAVR.   Thoracic aortic atherosclerosis. Degenerative changes of the visualized thoracolumbar spine. IMPRESSION: Mild lingular and right lower lobe opacity, atelectasis versus pneumonia. Electronically Signed   By: Julian Hy M.D.   On: 05/15/2022 20:56    Microbiology: Results for orders placed or performed during the hospital encounter of 05/15/22  SARS Coronavirus 2 by RT PCR (hospital order, performed in [facility] hospital lab) *cepheid single result test* Anterior Nasal Swab     Status: None   Collection Time: 05/16/22  3:02 AM   Specimen: Anterior Nasal Swab  Result Value Ref Range Status   SARS Coronavirus 2 by RT PCR NEGATIVE NEGATIVE Final    Comment: (NOTE) SARS-CoV-2 target nucleic acids are NOT DETECTED.  The SARS-CoV-2 RNA is generally detectable in upper and lower respiratory specimens during the acute phase of infection. The lowest concentration of SARS-CoV-2 viral copies this assay can detect is 250 copies / mL. A negative result does not preclude SARS-CoV-2 infection and should not be used as the sole basis for treatment or other patient management decisions.  A negative result may occur with improper specimen collection / handling, submission of specimen other than nasopharyngeal swab, presence of viral mutation(s) within the areas targeted by this assay, and inadequate number of viral copies (<250 copies / mL). A negative result must be combined with clinical observations, patient history, and epidemiological information.  Fact Sheet for Patients:   https://www.patel.info/  Fact Sheet for Healthcare Providers: https://hall.com/  This test is not yet approved or  cleared by the [place] FDA and has been authorized for detection and/or diagnosis of SARS-CoV-2 by FDA under an Emergency Use Authorization (EUA).  This EUA will remain in effect (meaning this test can be used) for the duration of the COVID-19 declaration  under Section 564(b)(1) of the Act, 21 U.S.C. section 360bbb-3(b)(1), unless the authorization is terminated or revoked sooner.  Performed at [facility], [place] [address], [place], [place] [address]     Labs: CBC: Recent Labs  Lab 05/15/22 2020 05/16/22 1243 05/16/22 2219 05/17/22 0129 05/18/22 0134  WBC 7.6  --   --  8.0  --   NEUTROABS 6.2  --   --  5.4  --   HGB 7.1* 7.2* 8.9* 8.6* 8.8*  HCT 22.6* 22.9* 27.3* 26.7* 28.1*  MCV 83.7  --   --  83.7  --   PLT 192  --   --  186  --    Basic Metabolic Panel: Recent Labs  Lab 05/15/22 2020 05/16/22 0603 05/17/22 0129 05/18/22 0134  NA 130* 132* 132* 131*  K 3.6 3.2* 4.2 4.9  CL 99 97* 97* 99  CO2 '23 27 26 23  '$ GLUCOSE 108* 97 109* 102*  BUN '12 10 17 '$ 30*  CREATININE 0.77 0.76 0.98 1.14*  CALCIUM 7.8* 8.0* 8.1* 8.4*  MG  --  1.6*  --   --    Liver Function Tests: Recent Labs  Lab 05/16/22 0603  AST 19  ALT 12  ALKPHOS 47  BILITOT 0.7  PROT 8.5*  ALBUMIN 2.0*   CBG: Recent Labs  Lab 05/17/22 0632  GLUCAP 114*    Discharge time spent: greater than 30 minutes.  Signed: Tawni Millers, MD [facility] 05/18/2022

## 2022-05-18 NOTE — Progress Notes (Signed)
Mobility Specialist Progress Note:   05/18/22 1304  Mobility  Activity Ambulated with assistance in hallway  Level of Assistance Standby assist, set-up cues, supervision of patient - no hands on  Assistive Device Cane  Distance Ambulated (ft) 250 ft  Activity Response Tolerated well  $Mobility charge 1 Mobility   Pt received EOB willing to participate in mobility. No complaints of pain. Left EOB with call bell in reach and all needs met.   [facility] Beva Remund Mobility Specialist

## 2022-05-22 ENCOUNTER — Ambulatory Visit: Payer: Medicare Other | Admitting: Cardiology

## 2022-05-22 ENCOUNTER — Encounter: Payer: Self-pay | Admitting: Cardiology

## 2022-05-22 VITALS — BP 141/52 | HR 74 | Temp 97.8°F | Resp 16 | Ht 62.0 in | Wt 165.0 lb

## 2022-05-22 DIAGNOSIS — D638 Anemia in other chronic diseases classified elsewhere: Secondary | ICD-10-CM

## 2022-05-22 DIAGNOSIS — I10 Essential (primary) hypertension: Secondary | ICD-10-CM

## 2022-05-22 DIAGNOSIS — Z952 Presence of prosthetic heart valve: Secondary | ICD-10-CM

## 2022-05-22 DIAGNOSIS — I5031 Acute diastolic (congestive) heart failure: Secondary | ICD-10-CM

## 2022-05-22 MED ORDER — POTASSIUM CHLORIDE CRYS ER 10 MEQ PO TBCR: 10.0000 meq | EXTENDED_RELEASE_TABLET | Freq: Every day | ORAL | 0 refills | Status: DC

## 2022-05-22 MED ORDER — LOSARTAN POTASSIUM 25 MG PO TABS: 25.0000 mg | ORAL_TABLET | Freq: Every day | ORAL | 1 refills | Status: DC

## 2022-05-22 MED ORDER — FUROSEMIDE 20 MG PO TABS: 20.0000 mg | ORAL_TABLET | Freq: Every day | ORAL | 1 refills | Status: DC

## 2022-05-22 NOTE — Progress Notes (Unsigned)
Primary Physician:  [facility] [place]   Patient ID: Zoe [name], female    DOB: [birth year], 88 y.o.   MRN: [id number]  Subjective:    Chief Complaint  Patient presents with   Congestive Heart Failure   Hypertension   Follow-up    4 week    HPI: [name]  is a 88 y.o. female  with history of possibly mild coronary spasm by coronary angiogram in 2014 when she presented with angina pectoris, otherwise no significant CAD, severe AS s/p TAVR by Dr. Roxy [name] on 11/12., asymptomatic carotid artery stenosis,  essential hypertension, hyperlipidemia, and GERD.   Patient was admitted 04/03/2022 - 04/05/2022 with acute on chronic diastolic heart failure and hyponatremia (likely dilutional), as well as AKI.  Patient was diuresed and discharged on the following medications from a cardiac standpoint: Aspirin, carvedilol, Farxiga, losartan, pravastatin, torsemide 20 mg p.o. daily.  Patient now presents for hospital follow-up.  Patient has made significant diet and lifestyle modifications, following strict low-sodium diet.  She is also monitoring weight daily which remained stable.  Patient's weight in the office today compared to discharge is unchanged.  Patient states she is feeling well overall without dyspnea or orthopnea.  Denies leg swelling.  Her primary complaint today is mild general weakness and fatigue since discharge, which she states is slowly improving.  Past Medical History:  Diagnosis Date   Arthritis    some - per patient   Breast cancer ([place])    breast cancer / left    Cataract    bilat    GERD (gastroesophageal reflux disease)    History of kidney stones    Hyperlipidemia    Hypertension    Hypothyroidism    Macular degeneration    Left   S/P TAVR (transcatheter aortic valve replacement) 09/03/2018   23 mm Edwards Sapien 3 transcatheter heart valve placed via percutaneous right transfemoral approach    Severe aortic stenosis    Stress incontinence     Thyroid disease    Tinnitus    Social History   Tobacco Use   Smoking status: Never   Smokeless tobacco: Never  Substance Use Topics   Alcohol use: No     Review of Systems  Constitutional: Negative for weight gain.  Cardiovascular:  Positive for dyspnea on exertion (improved). Negative for chest pain, leg swelling, near-syncope, orthopnea and syncope.   Objective:  Blood pressure (!) 141/52, pulse 74, temperature 97.8 F (36.6 C), temperature source Temporal, resp. rate 16, height _0  (1.575 m), weight 165 lb (74.8 kg), SpO2 97 %. Body mass index is 30.18 kg/m.     05/22/2022    9:01 AM 05/18/2022   11:23 AM 05/18/2022    7:38 AM  Vitals with BMI  Height _1     Weight 165 lbs    BMI 99.37    Systolic 169 678 938  Diastolic 52 55 50  Pulse 74 75 75      Physical Exam Vitals reviewed.  Constitutional:      Appearance: She is obese.  Neck:     Vascular: No JVD.  Cardiovascular:     Rate and Rhythm: Normal rate and regular rhythm.     Pulses: Normal pulses and intact distal pulses.          Carotid pulses are  on the right side with bruit and  on the left side with bruit.    Heart sounds: Murmur heard.     Early  systolic murmur is present with a grade of 1/6 at the upper right sternal border.  Pulmonary:     Effort: Pulmonary effort is normal. No accessory muscle usage or respiratory distress.     Breath sounds: Normal breath sounds.  Abdominal:     General: Bowel sounds are normal.     Palpations: Abdomen is soft.  Musculoskeletal:     Right lower leg: No edema.     Left lower leg: No edema.    Radiology: No results found.  Laboratory examination:      Latest Ref Rng & Units 05/18/2022    1:34 AM 05/17/2022    1:29 AM 05/16/2022    6:03 AM  CMP  Glucose 70 - 99 mg/dL 102  109  97   BUN 8 - 23 mg/dL _0 Creatinine 0.44 - 1.00 mg/dL 1.14  0.98  0.76   Sodium 135 - 145 mmol/L 131  132  132   Potassium 3.5 - 5.1 mmol/L 4.9  4.2  3.2    Chloride 98 - 111 mmol/L 99  97  97   CO2 22 - 32 mmol/L _1 Calcium 8.9 - 10.3 mg/dL 8.4  8.1  8.0   Total Protein 6.5 - 8.1 g/dL   8.5   Total Bilirubin 0.3 - 1.2 mg/dL   0.7   Alkaline Phos 38 - 126 U/L   47   AST 15 - 41 U/L   19   ALT 0 - 44 U/L   12       Latest Ref Rng & Units 05/18/2022    1:34 AM 05/17/2022    1:29 AM 05/16/2022   10:19 PM  CBC  WBC 4.0 - 10.5 K/uL  8.0    Hemoglobin 12.0 - 15.0 g/dL 8.8  8.6  8.9   Hematocrit 36.0 - 46.0 % 28.1  26.7  27.3   Platelets 150 - 400 K/uL  186     Lipid Panel     Component Value Date/Time   CHOL 103 07/16/2019 0941   TRIG 75 07/16/2019 0941   HDL 48 07/16/2019 0941   LDLCALC 39 07/16/2019 0941   LDLDIRECT 48 07/16/2019 0941   HEMOGLOBIN A1C Lab Results  Component Value Date   HGBA1C 5.8 (H) 07/16/2019   MPG 111.15 08/30/2018   TSH Recent Labs    04/02/22 1830  TSH 0.891   External labs  Labs 10/04/2021:  A1c 5.7%.  TSH normal at 1.18.  Vitamin D38.6.  Hb 10.3/HCT 32.3, platelets 232.  BUN 23, creatinine 0.87, EGFR 60 mill, potassium 4.1.  Total cholesterol 113, triglycerides 100, HDL 43, LDL 50.   Current Outpatient Medications:    Artificial Tear Solution (SOOTHE XP) SOLN, Place 1 drop into both eyes every evening., Disp: , Rfl:    aspirin 81 MG chewable tablet, Chew 1 tablet (81 mg total) by mouth daily. (Patient taking differently: Chew 81 mg by mouth every evening.), Disp: , Rfl:    carvedilol (COREG) 12.5 MG tablet, Take 12.5 mg by mouth 2 (two) times daily with a meal., Disp: , Rfl:    Cholecalciferol (VITAMIN D3) 2000 units TABS, Take 2,000 Units by mouth daily., Disp: , Rfl:    dapagliflozin propanediol (FARXIGA) 10 MG TABS tablet, Take 1 tablet (10 mg total) by mouth daily., Disp: 30 tablet, Rfl: 2   esomeprazole (NEXIUM) 20 MG capsule, Take 20 mg by mouth daily., Disp: , Rfl:  ferrous sulfate 325 (65 FE) MG tablet, Take 325 mg by mouth every other day., Disp: , Rfl:     levothyroxine (SYNTHROID, LEVOTHROID) 100 MCG tablet, Take 100 mcg by mouth daily before breakfast., Disp: , Rfl: 2   losartan (COZAAR) 25 MG tablet, Take 0.5 tablets (12.5 mg total) by mouth daily., Disp: 30 tablet, Rfl: 1   Multiple Vitamins-Minerals (PRESERVISION AREDS 2) CAPS, Take 1 capsule by mouth 2 (two) times daily., Disp: , Rfl:    pravastatin (PRAVACHOL) 40 MG tablet, Take 40 mg by mouth every evening., Disp: , Rfl:    furosemide (LASIX) 20 MG tablet, Take 1 tablet (20 mg total) by mouth daily. In case of weight gain 2 to 3 lbs in 24 hrs or 5 lbs in 7 days, shortness of breath or leg swelling take 2 tablets daily., Disp: 90 tablet, Rfl: 1   potassium chloride (KLOR-CON M) 10 MEQ tablet, Take 1 tablet (10 mEq total) by mouth daily., Disp: 90 tablet, Rfl: 0     Cardiac Studies:    Coronary angiogram 08/06/2018: normal coronary arteries. Moderate pulmonary hypertension due to AS with elevated PW. Preserved CO and CI.  TAVR 09/03/2018: Edwards Sapien 3 THV (size 23 mm, model # 9600TFX, serial # [id number]) by Lilly Cove and Darlina Guys.  CXR PA/LAT 11/07/2018:  Cardiac shadow is enlarged but stable. Changes of prior TAVR ar again noted. Aortic calcifications are seen. Previously noted left upper lobe pneumonia has resolved in the interval. No focal infiltrate or sizable effusion is seen. Changes of prior vertebral augmentation are noted.  Carotid artery duplex 01/05/2021: Minimal stenosis in the right internal carotid artery (1-15%). Stenosis in the right external carotid artery (<50%). Duplex suggests stenosis in the left internal carotid artery (50-69%). Stenosis in the left external carotid artery (<50%). Antegrade right vertebral artery flow. Antegrade left vertebral artery flow. Follow up in six months is appropriate if clinically indicated.  No significant change since 06/23/2020.  ECHOCARDIOGRAM COMPLETE 04/03/2022: 1. Left ventricular ejection fraction, by estimation, is 60 to  65%. The  left ventricle has normal function. The left ventricle has no regional  wall motion abnormalities. There is moderate left ventricular hypertrophy.  Left ventricular diastolic  parameters are consistent with Grade II diastolic dysfunction  (pseudonormalization).   2. Right ventricular systolic function is normal. The right ventricular  size is normal. Tricuspid regurgitation signal is inadequate for assessing  PA pressure.   3. Left atrial size was moderately dilated.   4. The mitral valve is degenerative. Trivial mitral valve regurgitation.  Moderate mitral annular calcification.   5. Aortic prosthesis is well seated. No paravalvular leak. DI  0.36. AV peak velocity 2.8 m/s. No significant changes from prior echo.  Aortic valve regurgitation is not visualized. Aortic valve mean gradient  measures 18.3 mmHg. Aortic valve  peak gradient measures 32.2 mmHg. Aortic valve area, by VTI measures 1.13  cm. There is a 23 mm Edwards Sapien prosthetic, stented (TAVR) valve  present in the aortic position.   6. Aortic no signficant ascending aortic aneurysm.   7. The inferior vena cava is normal in size with greater than 50%  respiratory variability, suggesting right atrial pressure of 3 mmHg.   Comparison(s): No significant change from prior study.   EKG  EKG 01/24/2022: Normal sinus rhythm at rate of 84 bpm, no evidence of ischemia, normal EKG. no significant change from 01/09/2021.   Assessment:     ICD-10-CM   1. TAVR 09/03/2018: Oletta Lamas Sapien 3  THV (size 23 mm, model # 9600TFX, serial # [id number])  Z95.2     2. Acute diastolic heart failure (HCC)  I50.31 furosemide (LASIX) 20 MG tablet    potassium chloride (KLOR-CON M) 10 MEQ tablet    Ambulatory referral to Hematology / Oncology    3. Anemia of chronic disease  D63.8 Ambulatory referral to Hematology / Oncology    4. Primary hypertension  I10      Meds ordered this encounter  Medications   furosemide (LASIX) 20 MG  tablet    Sig: Take 1 tablet (20 mg total) by mouth daily. In case of weight gain 2 to 3 lbs in 24 hrs or 5 lbs in 7 days, shortness of breath or leg swelling take 2 tablets daily.    Dispense:  90 tablet    Refill:  1   potassium chloride (KLOR-CON M) 10 MEQ tablet    Sig: Take 1 tablet (10 mEq total) by mouth daily.    Dispense:  90 tablet    Refill:  0    Medications Discontinued During This Encounter  Medication Reason   furosemide (LASIX) 20 MG tablet Reorder   potassium chloride (KLOR-CON M) 10 MEQ tablet Reorder    Recommendations:   [name] is a fairly active 88 y.o. Caucasian female with history of possibly mild coronary spasm by coronary angiogram in 2014,  severe AS s/p TAVR by Dr. Roxy [name] on 09/03/2018, asymptomatic bilateral carotid artery stenosis,  essential hypertension, hyperlipidemia, hyperglycemia and GERD.  Patient was admitted 04/03/2022 - 04/05/2022 with acute on chronic diastolic heart failure and hyponatremia (likely dilutional), as well as AKI.  Patient was diuresed and discharged on the following medications from a cardiac standpoint: Aspirin, carvedilol, Farxiga, losartan, pravastatin, torsemide 20 mg p.o. daily.  Patient now presents for hospital follow-up.  Patient is congratulated on her efforts regarding diet and lifestyle modifications.  Patient appears euvolemic on exam and is fairly asymptomatic from a cardiovascular standpoint.  Weight remains stable.  Reviewed and discussed results of recent laboratory testing, details above.  Creatinine has continued to trend up and sodium down to 125.  We will therefore discontinue torsemide.  We will repeat BMP, urine osmolality, and serum osmolality in 4-5 days.  If renal function and sodium do not improve with discontinuation of torsemide would recommend referral to nephrology.  Encourage patient to continue with low-sodium diet.  We will follow-up in 4 weeks, sooner if needed with Dr. Einar Gip per patient  preference.  Patient was seen in collaboration with Dr. Einar Gip and he is in agreement with the plan.    Adrian Prows, PA-C 05/22/2022, 10:10 AM Office: [phone]

## 2022-05-23 ENCOUNTER — Telehealth: Payer: Self-pay | Admitting: Physician Assistant

## 2022-05-23 NOTE — Telephone Encounter (Signed)
Scheduled appt per 7/31 referral. Pt is aware of appt date and time. Pt is aware to arrive 15 mins prior to appt time and to bring and updated insurance card. Pt is aware of appt location.   

## 2022-06-12 ENCOUNTER — Other Ambulatory Visit: Payer: Self-pay

## 2022-06-12 DIAGNOSIS — I5031 Acute diastolic (congestive) heart failure: Secondary | ICD-10-CM

## 2022-06-12 MED ORDER — LOSARTAN POTASSIUM 25 MG PO TABS: 25.0000 mg | ORAL_TABLET | Freq: Every day | ORAL | 1 refills | Status: DC

## 2022-06-14 ENCOUNTER — Other Ambulatory Visit: Payer: Self-pay

## 2022-06-14 ENCOUNTER — Telehealth: Payer: Self-pay | Admitting: Physician Assistant

## 2022-06-14 ENCOUNTER — Inpatient Hospital Stay: Payer: Medicare Other | Attending: Physician Assistant | Admitting: Physician Assistant

## 2022-06-14 ENCOUNTER — Inpatient Hospital Stay: Payer: Medicare Other

## 2022-06-14 ENCOUNTER — Encounter: Payer: Self-pay | Admitting: Physician Assistant

## 2022-06-14 VITALS — BP 125/65 | HR 73 | Temp 98.1°F | Resp 15 | Wt 164.4 lb

## 2022-06-14 DIAGNOSIS — E039 Hypothyroidism, unspecified: Secondary | ICD-10-CM | POA: Diagnosis not present

## 2022-06-14 DIAGNOSIS — E785 Hyperlipidemia, unspecified: Secondary | ICD-10-CM | POA: Diagnosis not present

## 2022-06-14 DIAGNOSIS — I509 Heart failure, unspecified: Secondary | ICD-10-CM | POA: Diagnosis not present

## 2022-06-14 DIAGNOSIS — Z853 Personal history of malignant neoplasm of breast: Secondary | ICD-10-CM

## 2022-06-14 DIAGNOSIS — D649 Anemia, unspecified: Secondary | ICD-10-CM | POA: Diagnosis present

## 2022-06-14 LAB — CBC WITH DIFFERENTIAL (CANCER CENTER ONLY)
Abs Immature Granulocytes: 0.04 10*3/uL (ref 0.00–0.07)
Basophils Absolute: 0 10*3/uL (ref 0.0–0.1)
Basophils Relative: 1 %
Eosinophils Absolute: 0.1 10*3/uL (ref 0.0–0.5)
Eosinophils Relative: 1 %
HCT: 28.2 % — ABNORMAL LOW (ref 36.0–46.0)
Hemoglobin: 9.3 g/dL — ABNORMAL LOW (ref 12.0–15.0)
Immature Granulocytes: 1 %
Lymphocytes Relative: 19 %
Lymphs Abs: 1.2 10*3/uL (ref 0.7–4.0)
MCH: 27.4 pg (ref 26.0–34.0)
MCHC: 33 g/dL (ref 30.0–36.0)
MCV: 82.9 fL (ref 80.0–100.0)
Monocytes Absolute: 0.9 10*3/uL (ref 0.1–1.0)
Monocytes Relative: 15 %
Neutro Abs: 4.2 10*3/uL (ref 1.7–7.7)
Neutrophils Relative %: 63 %
Platelet Count: 183 10*3/uL (ref 150–400)
RBC: 3.4 MIL/uL — ABNORMAL LOW (ref 3.87–5.11)
RDW: 21.3 % — ABNORMAL HIGH (ref 11.5–15.5)
WBC Count: 6.5 10*3/uL (ref 4.0–10.5)
nRBC: 0 % (ref 0.0–0.2)

## 2022-06-14 LAB — CMP (CANCER CENTER ONLY)
ALT: 11 U/L (ref 0–44)
AST: 21 U/L (ref 15–41)
Albumin: 2.8 g/dL — ABNORMAL LOW (ref 3.5–5.0)
Alkaline Phosphatase: 53 U/L (ref 38–126)
Anion gap: 6 (ref 5–15)
BUN: 20 mg/dL (ref 8–23)
CO2: 27 mmol/L (ref 22–32)
Calcium: 8.5 mg/dL — ABNORMAL LOW (ref 8.9–10.3)
Chloride: 96 mmol/L — ABNORMAL LOW (ref 98–111)
Creatinine: 0.82 mg/dL (ref 0.44–1.00)
GFR, Estimated: >60 mL/min (ref >60)
Glucose, Bld: 101 mg/dL — ABNORMAL HIGH (ref 70–99)
Potassium: 4.1 mmol/L (ref 3.5–5.1)
Sodium: 129 mmol/L — ABNORMAL LOW (ref 135–145)
Total Bilirubin: 0.5 mg/dL (ref 0.3–1.2)
Total Protein: 10.3 g/dL — ABNORMAL HIGH (ref 6.5–8.1)

## 2022-06-14 LAB — VITAMIN B12: Vitamin B-12: 180 pg/mL (ref 180–914)

## 2022-06-14 LAB — LACTATE DEHYDROGENASE: LDH: 145 U/L (ref 98–192)

## 2022-06-14 LAB — RETIC PANEL
Immature Retic Fract: 27.4 % — ABNORMAL HIGH (ref 2.3–15.9)
RBC.: 3.4 MIL/uL — ABNORMAL LOW (ref 3.87–5.11)
Retic Count, Absolute: 45.9 10*3/uL (ref 19.0–186.0)
Retic Ct Pct: 1.4 % (ref 0.4–3.1)
Reticulocyte Hemoglobin: 30.5 pg (ref >27.9)

## 2022-06-14 LAB — FERRITIN: Ferritin: 92 ng/mL (ref 11–307)

## 2022-06-14 NOTE — Telephone Encounter (Signed)
Per 8/23 los called and spoke to pt about appointment

## 2022-06-14 NOTE — Progress Notes (Signed)
[place] Telephone:([phone]) [phone]   Fax:([phone]) [place] NOTE  Patient Care Team: [facility] [facility] as PCP - General (Rheumatology)   CHIEF COMPLAINTS/PURPOSE OF CONSULTATION:  Normocytic anemia  HISTORY OF PRESENTING ILLNESS:  Zoe [name] 88 y.o. female with medical history significant for aortic stenosis status post TAVR, left-sided breast cancer status post lumpectomy, GERD, hypothyroidism, hyperlipidemia, heart failure.  Patient presents to the hematology clinic for evaluation of normocytic anemia.  She is accompanied by her daughter-in-law for this visit.  On review of the previous records, evidence of longstanding normocytic anemia as far back as 2012.  She was recently hospitalized from 05/15/2022 until 05/18/2022 for symptomatic anemia with a hemoglobin of 7.1.  She received 1 unit of packed red blood cells.  Hemoglobin at discharge was 8.8.  On exam today, Zoe [name] reports ongoing fatigue that does affect her ability to complete her ADLs.  She requires frequent resting.  She denies any appetite loss or weight changes.  She denies any nausea, vomiting or abdominal pain.  Her bowel habits are unchanged without any recurrent episodes of diarrhea or constipation.  She denies easy bruising or signs of active bleeding.  Patient reports occasional episodes of dizziness especially when she takes certain medications.  She denies any fevers, chills, night sweats, shortness of breath, chest pain or cough.  She has no other complaints.  The 10 point ROS is below.  MEDICAL HISTORY:  Past Medical History:  Diagnosis Date   Arthritis    some - per patient   Breast cancer ([place])    breast cancer / left    Cataract    bilat    GERD (gastroesophageal reflux disease)    History of kidney stones    Hyperlipidemia    Hypertension    Hypothyroidism    Macular degeneration    Left   S/P TAVR (transcatheter aortic valve replacement)  09/03/2018   23 mm Edwards Sapien 3 transcatheter heart valve placed via percutaneous right transfemoral approach    Severe aortic stenosis    Stress incontinence    Thyroid disease    Tinnitus     SURGICAL HISTORY: Past Surgical History:  Procedure Laterality Date   ABDOMINAL HYSTERECTOMY  1970's   BACK SURGERY     BREAST LUMPECTOMY  12/1998   lumpectomy   CARDIAC CATHETERIZATION     EYE SURGERY     cataract surgery bilat    INTRAOPERATIVE TRANSTHORACIC ECHOCARDIOGRAM N/A 09/03/2018   Procedure: INTRAOPERATIVE TRANSTHORACIC ECHOCARDIOGRAM;  Surgeon: Burnell Blanks, MD;  Location: [facility];  Service: Open Heart Surgery;  Laterality: N/A;   LITHOTRIPSY     Right total knee     2018 Dr. Alvan Dame   RIGHT/LEFT HEART CATH AND CORONARY ANGIOGRAPHY N/A 08/06/2018   Procedure: RIGHT/LEFT HEART CATH AND CORONARY ANGIOGRAPHY;  Surgeon: Adrian Prows, MD;  Location: [facility];  Service: Cardiovascular;  Laterality: N/A;   THYROIDECTOMY, PARTIAL  1975   TONSILLECTOMY     as a child - patient not sure of exact date   TOTAL KNEE ARTHROPLASTY Left 03/13/2016   Procedure: TOTAL KNEE ARTHROPLASTY;  Surgeon: Paralee Cancel, MD;  Location: [facility];  Service: Orthopedics;  Laterality: Left;   TOTAL KNEE ARTHROPLASTY Right 06/18/2017   Procedure: RIGHT TOTAL KNEE ARTHROPLASTY;  Surgeon: Paralee Cancel, MD;  Location: [facility];  Service: Orthopedics;  Laterality: Right;   TRANSCATHETER AORTIC VALVE REPLACEMENT, TRANSFEMORAL N/A 09/03/2018   Procedure: TRANSCATHETER AORTIC VALVE REPLACEMENT, TRANSFEMORAL;  Surgeon: Burnell Blanks, MD;  Location: [facility];  Service: Open Heart Surgery;  Laterality: N/A;    SOCIAL HISTORY: Social History   Socioeconomic History   Marital status: Widowed    Spouse name: Not on file   Number of children: 4   Years of education: Not on file   Highest education level: Master's degree (e.g., MA, MS, MEng, MEd, MSW, MBA)  Occupational History   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  Tobacco Use   Smoking status: Never   Smokeless tobacco: Never  Vaping Use   Vaping Use: Never used  Substance and Sexual Activity   Alcohol use: No   Drug use: No   Sexual activity: Not Currently  Other Topics Concern   Not on file  Social History Narrative   Not on file   Social Determinants of Health   Financial Resource Strain: Low Risk  (11/12/2018)   Overall Financial Resource Strain (CARDIA)    Difficulty of Paying Living Expenses: Not very hard  Food Insecurity: No Food Insecurity (11/12/2018)   Hunger Vital Sign    Worried About Running Out of Food in the Last Year: Never true    Ran Out of Food in the Last Year: Never true  Transportation Needs: No Transportation Needs (11/12/2018)   PRAPARE - Hydrologist (Medical): No    Lack of Transportation (Non-Medical): No  Physical Activity: Inactive (11/12/2018)   Exercise Vital Sign    Days of Exercise per Week: 0 days    Minutes of Exercise per Session: 0 min  Stress: Stress Concern Present (11/12/2018)   [place]    Feeling of Stress : To some extent  Social Connections: Not on file  Intimate Partner Violence: Not on file    FAMILY HISTORY: Family History  Problem Relation Age of Onset   Diabetes Mother    Stroke Mother        Carotid artery disease   Heart disease Father        CAD   Coronary artery disease Father    Diabetes Sister     ALLERGIES:  is allergic to penicillins and sulfa antibiotics.  MEDICATIONS:  Current Outpatient Medications  Medication Sig Dispense Refill   Artificial Tear Solution (SOOTHE XP) SOLN Place 1 drop into both eyes every evening.     aspirin 81 MG chewable tablet Chew 1 tablet (81 mg total) by mouth daily. (Patient taking differently: Chew 81 mg by mouth every evening.)     carvedilol (COREG) 12.5 MG tablet Take 12.5 mg by mouth  2 (two) times daily with a meal.     Cholecalciferol (VITAMIN D3) 2000 units TABS Take 2,000 Units by mouth daily.     dapagliflozin propanediol (FARXIGA) 10 MG TABS tablet Take 1 tablet (10 mg total) by mouth daily. 30 tablet 2   esomeprazole (NEXIUM) 20 MG capsule Take 20 mg by mouth daily.     ferrous sulfate 325 (65 FE) MG tablet Take 325 mg by mouth daily with breakfast.     furosemide (LASIX) 20 MG tablet Take 1 tablet (20 mg total) by mouth daily. In case of weight gain 2 to 3 lbs in 24 hrs or 5 lbs in 7 days, shortness of breath or leg swelling take 2 tablets daily. 90 tablet 1   levothyroxine (SYNTHROID, LEVOTHROID) 100 MCG tablet Take 100 mcg by mouth daily before breakfast.  2  losartan (COZAAR) 25 MG tablet Take 1 tablet (25 mg total) by mouth daily. 90 tablet 1   Multiple Vitamins-Minerals (PRESERVISION AREDS 2) CAPS Take 1 capsule by mouth 2 (two) times daily.     potassium chloride (KLOR-CON M) 10 MEQ tablet Take 1 tablet (10 mEq total) by mouth daily. 90 tablet 0   pravastatin (PRAVACHOL) 40 MG tablet Take 40 mg by mouth every evening.     No current facility-administered medications for this visit.    REVIEW OF SYSTEMS:   Constitutional: ( - ) fevers, ( - )  chills , ( - ) night sweats Eyes: ( - ) blurriness of vision, ( - ) double vision, ( - ) watery eyes Ears, nose, mouth, throat, and face: ( - ) mucositis, ( - ) sore throat Respiratory: ( - ) cough, ( - ) dyspnea, ( - ) wheezes Cardiovascular: ( - ) palpitation, ( - ) chest discomfort, ( - ) lower extremity swelling Gastrointestinal:  ( - ) nausea, ( - ) heartburn, ( - ) change in bowel habits Skin: ( - ) abnormal skin rashes Lymphatics: ( - ) new lymphadenopathy, ( - ) easy bruising Neurological: ( - ) numbness, ( - ) tingling, ( - ) new weaknesses Behavioral/Psych: ( - ) mood change, ( - ) new changes  All other systems were reviewed with the patient and are negative.  PHYSICAL EXAMINATION: ECOG PERFORMANCE  STATUS: 1 - Symptomatic but completely ambulatory  Vitals:   06/14/22 1110  BP: 125/65  Pulse: 73  Resp: 15  Temp: 98.1 F (36.7 C)  SpO2: 98%   Filed Weights   06/14/22 1110  Weight: 164 lb 6.4 oz (74.6 kg)    GENERAL: well appearing female in NAD  SKIN: skin color, texture, turgor are normal, no rashes or significant lesions EYES: conjunctiva are pink and non-injected, sclera clear OROPHARYNX: no exudate, no erythema; lips, buccal mucosa, and tongue normal  NECK: supple, non-tender LYMPH:  no palpable lymphadenopathy in the cervical, axillary or supraclavicular lymph nodes.  LUNGS: clear to auscultation and percussion with normal breathing effort HEART: regular rate & rhythm and no murmurs.  Bilateral lower extremity edema with varicosities. ABDOMEN: soft, non-tender, non-distended, normal bowel sounds Musculoskeletal: no cyanosis of digits and no clubbing  PSYCH: alert & oriented x 3, fluent speech NEURO: no focal motor/sensory deficits  LABORATORY DATA:  I have reviewed the data as listed    Latest Ref Rng & Units 05/18/2022    1:34 AM 05/17/2022    1:29 AM 05/16/2022   10:19 PM  CBC  WBC 4.0 - 10.5 K/uL  8.0    Hemoglobin 12.0 - 15.0 g/dL 8.8  8.6  8.9   Hematocrit 36.0 - 46.0 % 28.1  26.7  27.3   Platelets 150 - 400 K/uL  186         Latest Ref Rng & Units 05/18/2022    1:34 AM 05/17/2022    1:29 AM 05/16/2022    6:03 AM  CMP  Glucose 70 - 99 mg/dL 102  109  97   BUN 8 - 23 mg/dL '30  17  10   ' Creatinine 0.44 - 1.00 mg/dL 1.14  0.98  0.76   Sodium 135 - 145 mmol/L 131  132  132   Potassium 3.5 - 5.1 mmol/L 4.9  4.2  3.2   Chloride 98 - 111 mmol/L 99  97  97   CO2 22 - 32 mmol/L 23  26  27   Calcium 8.9 - 10.3 mg/dL 8.4  8.1  8.0   Total Protein 6.5 - 8.1 g/dL   8.5   Total Bilirubin 0.3 - 1.2 mg/dL   0.7   Alkaline Phos 38 - 126 U/L   47   AST 15 - 41 U/L   19   ALT 0 - 44 U/L   12    RADIOGRAPHIC STUDIES: I have personally reviewed the radiological  images as listed and agreed with the findings in the report. DG Chest 2 View  Result Date: 05/15/2022 CLINICAL DATA:  Shortness of breath, cough EXAM: CHEST - 2 VIEW COMPARISON:  04/02/2022 FINDINGS: Mild central right lower lobe and lingular opacity, atelectasis versus pneumonia. Mild left basilar opacity, likely atelectasis/scarring. No pleural effusion or pneumothorax. Cardiomegaly.  Status post TAVR.  Thoracic aortic atherosclerosis. Degenerative changes of the visualized thoracolumbar spine. IMPRESSION: Mild lingular and right lower lobe opacity, atelectasis versus pneumonia. Electronically Signed   By: Julian Hy M.D.   On: 05/15/2022 20:56    ASSESSMENT & PLAN LOELLA HICKLE is a 88 y.o. female who presents to the clinic for initial evaluation for normocytic anemia.  We reviewed possible etiologies including nutritional deficiencies, hemolysis, valvular disease, inflammatory process, renal dysfunction and bone marrow disorders.  Upon review of prior labs, there is no evidence of iron deficiency patient is taking ferrous sulfate 325 mg once a day.  We reviewed commend serologic work-up today which includes checking CBC, CMP, retic panel, LDH, haptoglobin, SPEP with IFE, sFLC, vitamin B12 and MMA, ferritin, and erythropoietin levels.  #Normocytic anemia, etiology unknown: --Labs today to check CBC, CMP, retic panel, LDH, haptoglobin, SPEP with IFE, sFLC, vitamin B12 and MMA, ferritin, and erythropoietin levels. --If above workup is negative, recommend bone marrow biopsy --Tentative follow up will be scheduled for 3 months.   Orders Placed This Encounter  Procedures   CBC with Differential ([place] Only)    Standing Status:   Future    Number of Occurrences:   1    Standing Expiration Date:   06/15/2023   CMP ([place] only)    Standing Status:   Future    Number of Occurrences:   1    Standing Expiration Date:   06/15/2023   Retic Panel    Standing Status:   Future     Number of Occurrences:   1    Standing Expiration Date:   06/15/2023   Lactate dehydrogenase (LDH)    Standing Status:   Future    Number of Occurrences:   1    Standing Expiration Date:   06/14/2023   Haptoglobin    Standing Status:   Future    Number of Occurrences:   1    Standing Expiration Date:   06/14/2023   Vitamin B12    Standing Status:   Future    Number of Occurrences:   1    Standing Expiration Date:   06/14/2023   Methylmalonic acid, serum    Standing Status:   Future    Number of Occurrences:   1    Standing Expiration Date:   06/14/2023   Multiple Myeloma Panel (SPEP&IFE w/QIG)    Standing Status:   Future    Number of Occurrences:   1    Standing Expiration Date:   06/14/2023   Kappa/lambda light chains    Standing Status:   Future    Number of Occurrences:   1  Standing Expiration Date:   06/14/2023   Erythropoietin    Standing Status:   Future    Number of Occurrences:   1    Standing Expiration Date:   06/14/2023   Ferritin    Standing Status:   Future    Number of Occurrences:   1    Standing Expiration Date:   06/14/2023    All questions were answered. The patient knows to call the clinic with any problems, questions or concerns.  I have spent a total of 60 minutes minutes of face-to-face and non-face-to-face time, preparing to see the patient, obtaining and/or reviewing separately obtained history, performing a medically appropriate examination, counseling and educating the patient, ordering tests/procedures,documenting clinical information in the electronic health record, and care coordination.   [name], PA-C Department of Hematology/Oncology [place] at [facility] Phone: [phone]  Patient was seen with Dr. Lorenso Courier  I have read the above note and personally examined the patient. I agree with the assessment and plan as noted above.  Briefly Mrs. Mucha is an 88 year old female who presents for evaluation of  normocytic anemia.  The patient's last labs on 05/17/2022 showed white blood cell count 8.0, hemoglobin 8.8, and platelets of 186.  Due to concern for these findings the patient was referred to hematology for further evaluation and management.  At this time we will perform a full anemia work-up to include nutritional panel with iron panel, ferritin, and vitamin B12 with folate.  Additionally we will order SPEP and serum free light chains.  Patient has good kidney function and low suspicion of CKD.  In the event no clear etiology can be found we will need to pursue a bone marrow biopsy.  Patient voiced understanding of our findings and the plan moving forward.   Ledell Peoples, MD Department of Hematology/Oncology [place] at [facility] Phone: [phone] Pager: [phone] Email: [email or web address]

## 2022-06-15 LAB — KAPPA/LAMBDA LIGHT CHAINS
Kappa free light chain: 7.2 mg/L (ref 3.3–19.4)
Kappa, lambda light chain ratio: 0.67 (ref 0.26–1.65)
Lambda free light chains: 10.8 mg/L (ref 5.7–26.3)

## 2022-06-15 LAB — ERYTHROPOIETIN: Erythropoietin: 31.2 m[IU]/mL — ABNORMAL HIGH (ref 2.6–18.5)

## 2022-06-15 LAB — HAPTOGLOBIN: Haptoglobin: 58 mg/dL (ref 41–333)

## 2022-06-16 LAB — METHYLMALONIC ACID, SERUM: Methylmalonic Acid, Quantitative: 423 nmol/L — ABNORMAL HIGH (ref 0–378)

## 2022-06-19 LAB — MULTIPLE MYELOMA PANEL, SERUM
Albumin SerPl Elph-Mcnc: 3.6 g/dL (ref 2.9–4.4)
Albumin/Glob SerPl: 0.6 — ABNORMAL LOW (ref 0.7–1.7)
Alpha 1: 0.3 g/dL (ref 0.0–0.4)
Alpha2 Glob SerPl Elph-Mcnc: 0.6 g/dL (ref 0.4–1.0)
B-Globulin SerPl Elph-Mcnc: 0.7 g/dL (ref 0.7–1.3)
Gamma Glob SerPl Elph-Mcnc: 5 g/dL — ABNORMAL HIGH (ref 0.4–1.8)
Globulin, Total: 6.6 g/dL — ABNORMAL HIGH (ref 2.2–3.9)
IgA: 8 mg/dL — ABNORMAL LOW (ref 64–422)
IgG (Immunoglobin G), Serum: 5613 mg/dL — ABNORMAL HIGH (ref 586–1602)
IgM (Immunoglobulin M), Srm: 9 mg/dL — ABNORMAL LOW (ref 26–217)
M Protein SerPl Elph-Mcnc: 4.9 g/dL — ABNORMAL HIGH
Total Protein ELP: 10.2 g/dL — ABNORMAL HIGH (ref 6.0–8.5)

## 2022-06-20 ENCOUNTER — Telehealth: Payer: Self-pay | Admitting: Physician Assistant

## 2022-06-20 DIAGNOSIS — D472 Monoclonal gammopathy: Secondary | ICD-10-CM

## 2022-06-20 DIAGNOSIS — D649 Anemia, unspecified: Secondary | ICD-10-CM

## 2022-06-20 LAB — BASIC METABOLIC PANEL
BUN/Creatinine Ratio: 22 (ref 12–28)
BUN: 21 mg/dL (ref 8–27)
CO2: 23 mmol/L (ref 20–29)
Calcium: 8.7 mg/dL (ref 8.7–10.3)
Chloride: 95 mmol/L — ABNORMAL LOW (ref 96–106)
Creatinine, Ser: 0.94 mg/dL (ref 0.57–1.00)
Glucose: 98 mg/dL (ref 70–99)
Potassium: 4.3 mmol/L (ref 3.5–5.2)
Sodium: 130 mmol/L — ABNORMAL LOW (ref 134–144)
eGFR: 59 mL/min/{1.73_m2} — ABNORMAL LOW (ref >59)

## 2022-06-20 LAB — BRAIN NATRIURETIC PEPTIDE: BNP: 176.8 pg/mL — ABNORMAL HIGH (ref 0.0–100.0)

## 2022-06-20 MED ORDER — VITAMIN B-12 1000 MCG PO TABS: 1000.0000 ug | ORAL_TABLET | Freq: Every day | ORAL | 3 refills | Status: DC

## 2022-06-22 ENCOUNTER — Encounter: Payer: Medicare Other | Admitting: Cardiology

## 2022-06-22 ENCOUNTER — Encounter: Payer: Self-pay | Admitting: Cardiology

## 2022-06-22 ENCOUNTER — Ambulatory Visit: Payer: Medicare Other | Admitting: Cardiology

## 2022-06-22 VITALS — BP 112/64 | HR 74 | Temp 98.2°F | Resp 16 | Ht 62.0 in | Wt 165.0 lb

## 2022-06-22 DIAGNOSIS — E871 Hypo-osmolality and hyponatremia: Secondary | ICD-10-CM

## 2022-06-22 DIAGNOSIS — I10 Essential (primary) hypertension: Secondary | ICD-10-CM

## 2022-06-22 DIAGNOSIS — R0609 Other forms of dyspnea: Secondary | ICD-10-CM

## 2022-06-22 DIAGNOSIS — I5033 Acute on chronic diastolic (congestive) heart failure: Secondary | ICD-10-CM

## 2022-06-22 MED ORDER — POTASSIUM CHLORIDE CRYS ER 10 MEQ PO TBCR: 20.0000 meq | EXTENDED_RELEASE_TABLET | Freq: Every day | ORAL | 0 refills | Status: DC | PRN

## 2022-06-22 NOTE — Telephone Encounter (Signed)
I spoke to [name] to review the lab results from 06/14/2022. Findings show vitamin B!2 deficiency and monoclonal gammopathy with an M-spike detected. Recommend to start vitamin B12 PO supplementation so I have sent a prescription. Additionally, we need to proceed with full workup for monoclonal gammopathy. This includes 24 hour UPEP, bone met survey and bone marrow biopsy. We will follow up once workup is complete. Patient expressed understanding of the plan provided.

## 2022-06-22 NOTE — Progress Notes (Signed)
Primary Physician:  [facility] [place]   Patient ID: Zoe [name], female    DOB: 1934-08-19, 88 y.o.   MRN: [id number]  Subjective:    Chief Complaint  Patient presents with   AV replacement   Hypertension   Hyperlipidemia   Follow-up    6 months    HPI: [name]  is a 88 y.o. Caucasian female with history of possibly mild coronary spasm by coronary angiogram in 2014,  severe AS s/p TAVR by Dr. Roxy Manns on 09/03/2018, asymptomatic bilateral carotid artery stenosis,  essential hypertension, hyperlipidemia, hyperglycemia and GERD.  Patient was admitted 04/03/2022 - 04/05/2022 with acute on chronic diastolic heart failure and hyponatremia (likely dilutional), as well as AKI.  Patient was diuresed and discharged on the following medications from a cardiac standpoint: Aspirin, carvedilol, Farxiga, losartan, pravastatin, torsemide 20 mg p.o. daily.  She again presented on 05/15/2022 with shortness of breath and received IV furosemide and discharged home.  Patient was seen 6 weeks ago, she now presents for follow-up of acute on chronic diastolic heart failure.  She is now back to her baseline.  Patient states she is feeling well overall without dyspnea or orthopnea.  Denies leg swelling.     Past Medical History:  Diagnosis Date   Arthritis    some - per patient   Breast cancer ([place])    breast cancer / left    Cataract    bilat    GERD (gastroesophageal reflux disease)    History of kidney stones    Hyperlipidemia    Hypertension    Hypothyroidism    Macular degeneration    Left   S/P TAVR (transcatheter aortic valve replacement) 09/03/2018   23 mm Edwards Sapien 3 transcatheter heart valve placed via percutaneous right transfemoral approach    Severe aortic stenosis    Stress incontinence    Thyroid disease    Tinnitus    Social History   Tobacco Use   Smoking status: Never   Smokeless tobacco: Never  Substance Use Topics   Alcohol use: No      Review of Systems  Constitutional: Negative for weight gain.  Cardiovascular:  Positive for dyspnea on exertion (improved). Negative for chest pain, leg swelling, near-syncope, orthopnea and syncope.   Objective:  Blood pressure 112/64, pulse 74, temperature 98.2 F (36.8 C), temperature source Temporal, resp. rate 16, height 5' 2" (1.575 m), weight 165 lb (74.8 kg), SpO2 95 %. Body mass index is 30.18 kg/m.     06/22/2022   10:25 AM 06/14/2022   11:10 AM 05/22/2022    9:01 AM  Vitals with BMI  Height 5' 2"  5' 2"  Weight 165 lbs 164 lbs 6 oz 165 lbs  BMI 00.71  21.97  Systolic 588 325 498  Diastolic 64 65 52  Pulse 74 73 74      Physical Exam Vitals reviewed.  Constitutional:      Appearance: She is obese.  Neck:     Vascular: No JVD.  Cardiovascular:     Rate and Rhythm: Normal rate and regular rhythm.     Pulses: Normal pulses and intact distal pulses.          Carotid pulses are  on the right side with bruit and  on the left side with bruit.    Heart sounds: Murmur heard.     Early systolic murmur is present with a grade of 1/6 at the upper right sternal border.  Pulmonary:  Effort: Pulmonary effort is normal. No accessory muscle usage or respiratory distress.     Breath sounds: Normal breath sounds.  Abdominal:     General: Bowel sounds are normal.     Palpations: Abdomen is soft.  Musculoskeletal:     Right lower leg: No edema.     Left lower leg: No edema.    Radiology: No results found.  Laboratory examination:   Lab Results  Component Value Date   NA 130 (L) 06/19/2022   K 4.3 06/19/2022   CO2 23 06/19/2022   GLUCOSE 98 06/19/2022   BUN 21 06/19/2022   CREATININE 0.94 06/19/2022   CALCIUM 8.7 06/19/2022   EGFR 59 (L) 06/19/2022   GFRNONAA >60 06/14/2022       Latest Ref Rng & Units 06/19/2022   10:12 AM 06/14/2022   12:17 PM 05/18/2022    1:34 AM  CMP  Glucose 70 - 99 mg/dL 98  101  102   BUN 8 - 27 mg/dL _0 Creatinine 0.57 -  1.00 mg/dL 0.94  0.82  1.14   Sodium 134 - 144 mmol/L 130  129  131   Potassium 3.5 - 5.2 mmol/L 4.3  4.1  4.9   Chloride 96 - 106 mmol/L 95  96  99   CO2 20 - 29 mmol/L _1 Calcium 8.7 - 10.3 mg/dL 8.7  8.5  8.4   Total Protein 6.5 - 8.1 g/dL  10.3    Total Bilirubin 0.3 - 1.2 mg/dL  0.5    Alkaline Phos 38 - 126 U/L  53    AST 15 - 41 U/L  21    ALT 0 - 44 U/L  11        Latest Ref Rng & Units 06/14/2022   12:17 PM 05/18/2022    1:34 AM 05/17/2022    1:29 AM  CBC  WBC 4.0 - 10.5 K/uL 6.5   8.0   Hemoglobin 12.0 - 15.0 g/dL 9.3  8.8  8.6   Hematocrit 36.0 - 46.0 % 28.2  28.1  26.7   Platelets 150 - 400 K/uL 183   186    Lipid Panel     Component Value Date/Time   CHOL 103 07/16/2019 0941   TRIG 75 07/16/2019 0941   HDL 48 07/16/2019 0941   LDLCALC 39 07/16/2019 0941   LDLDIRECT 48 07/16/2019 0941   HEMOGLOBIN A1C Lab Results  Component Value Date   HGBA1C 5.8 (H) 07/16/2019   MPG 111.15 08/30/2018   TSH Recent Labs    04/02/22 1830  TSH 0.891   External labs  Labs 10/04/2021:  A1c 5.7%.  TSH normal at 1.18.  Vitamin D38.6.  Hb 10.3/HCT 32.3, platelets 232.  BUN 23, creatinine 0.87, EGFR 60 mill, potassium 4.1.  Total cholesterol 113, triglycerides 100, HDL 43, LDL 50.   Current Outpatient Medications:    Artificial Tear Solution (SOOTHE XP) SOLN, Place 1 drop into both eyes every evening., Disp: , Rfl:    aspirin 81 MG chewable tablet, Chew 1 tablet (81 mg total) by mouth daily. (Patient taking differently: Chew 81 mg by mouth every evening.), Disp: , Rfl:    carvedilol (COREG) 12.5 MG tablet, Take 12.5 mg by mouth 2 (two) times daily with a meal., Disp: , Rfl:    Cholecalciferol (VITAMIN D3) 2000 units TABS, Take 2,000 Units by mouth daily., Disp: , Rfl:    cyanocobalamin (VITAMIN B12)  1000 MCG tablet, Take 1 tablet (1,000 mcg total) by mouth daily., Disp: 30 tablet, Rfl: 3   dapagliflozin propanediol (FARXIGA) 10 MG TABS tablet, Take 1 tablet  (10 mg total) by mouth daily., Disp: 30 tablet, Rfl: 2   esomeprazole (NEXIUM) 20 MG capsule, Take 20 mg by mouth daily., Disp: , Rfl:    ferrous sulfate 325 (65 FE) MG tablet, Take 325 mg by mouth daily with breakfast., Disp: , Rfl:    levothyroxine (SYNTHROID, LEVOTHROID) 100 MCG tablet, Take 100 mcg by mouth daily before breakfast., Disp: , Rfl: 2   losartan (COZAAR) 25 MG tablet, Take 1 tablet (25 mg total) by mouth daily., Disp: 90 tablet, Rfl: 1   Multiple Vitamins-Minerals (PRESERVISION AREDS 2) CAPS, Take 1 capsule by mouth 2 (two) times daily., Disp: , Rfl:    pravastatin (PRAVACHOL) 40 MG tablet, Take 40 mg by mouth every evening., Disp: , Rfl:    furosemide (LASIX) 20 MG tablet, Take 1 tablet (20 mg total) by mouth daily as needed. weight gain 2 to 3 lbs in 24 hrs or 5 lbs in 7 days, shortness of breath or leg swelling., Disp: 90 tablet, Rfl: 1   potassium chloride (KLOR-CON M) 10 MEQ tablet, Take 2 tablets (20 mEq total) by mouth daily as needed. Take with furosemide, Disp: 180 tablet, Rfl: 0     Cardiac Studies:    Coronary angiogram 08/06/2018: normal coronary arteries. Moderate pulmonary hypertension due to AS with elevated PW. Preserved CO and CI.  TAVR 09/03/2018: Edwards Sapien 3 THV (size 23 mm, model # [id number], serial # [id number]) by Lilly [place] and Darlina Guys.  CXR PA/LAT 11/07/2018:  Cardiac shadow is enlarged but stable. Changes of prior TAVR ar again noted. Aortic calcifications are seen. Previously noted left upper lobe pneumonia has resolved in the interval. No focal infiltrate or sizable effusion is seen. Changes of prior vertebral augmentation are noted.  Carotid artery duplex 01/05/2021: Minimal stenosis in the right internal carotid artery (1-15%). Stenosis in the right external carotid artery (<50%). Duplex suggests stenosis in the left internal carotid artery (50-69%). Stenosis in the left external carotid artery (<50%). Antegrade right vertebral artery flow.  Antegrade left vertebral artery flow. Follow up in six months is appropriate if clinically indicated.  No significant change since 06/23/2020.  ECHOCARDIOGRAM COMPLETE 04/03/2022: 1. Left ventricular ejection fraction, by estimation, is 60 to 65%. The  left ventricle has normal function. The left ventricle has no regional  wall motion abnormalities. There is moderate left ventricular hypertrophy.  Left ventricular diastolic  parameters are consistent with Grade II diastolic dysfunction  (pseudonormalization).   2. Right ventricular systolic function is normal. The right ventricular  size is normal. Tricuspid regurgitation signal is inadequate for assessing  PA pressure.   3. Left atrial size was moderately dilated.   4. The mitral valve is degenerative. Trivial mitral valve regurgitation.  Moderate mitral annular calcification.   5. Aortic prosthesis is well seated. No paravalvular leak. DI  0.36. AV peak velocity 2.8 m/s. No significant changes from prior echo.  Aortic valve regurgitation is not visualized. Aortic valve mean gradient  measures 18.3 mmHg. Aortic valve  peak gradient measures 32.2 mmHg. Aortic valve area, by VTI measures 1.13  cm. There is a 23 mm Edwards Sapien prosthetic, stented (TAVR) valve  present in the aortic position.   6. Aortic no signficant ascending aortic aneurysm.   7. The inferior vena cava is normal in size with greater than 50%  respiratory variability, suggesting right atrial pressure of 3 mmHg.   Comparison(s): No significant change from prior study.   EKG  EKG 01/24/2022: Normal sinus rhythm at rate of 84 bpm, no evidence of ischemia, normal EKG. no significant change from 01/09/2021.   Assessment:     ICD-10-CM   1. Acute on chronic diastolic (congestive) heart failure (HCC)  I50.33 furosemide (LASIX) 20 MG tablet    potassium chloride (KLOR-CON M) 10 MEQ tablet    Ambulatory referral to [facility]    2. Dyspnea on  exertion  R06.09 Ambulatory referral to [facility]    3. Primary hypertension  I10 EKG 12-Lead    4. Hyponatremia with excess extracellular fluid volume  E87.1      Meds ordered this encounter  Medications   potassium chloride (KLOR-CON M) 10 MEQ tablet    Sig: Take 2 tablets (20 mEq total) by mouth daily as needed. Take with furosemide    Dispense:  180 tablet    Refill:  0    Medications Discontinued During This Encounter  Medication Reason   furosemide (LASIX) 20 MG tablet    potassium chloride (KLOR-CON M) 10 MEQ tablet     Recommendations:   Mrs. Ethelene Closser is a fairly active 88 y.o. Caucasian female with history of possibly mild coronary spasm by coronary angiogram in 2014,  severe AS s/p TAVR by Dr. Roxy Manns on 09/03/2018, asymptomatic bilateral carotid artery stenosis,  essential hypertension, hyperlipidemia, hyperglycemia and GERD.  Patient was admitted 04/03/2022 - 04/05/2022 with acute on chronic diastolic heart failure and hyponatremia (likely dilutional), as well as AKI.  Patient was diuresed and discharged on the following medications from a cardiac standpoint: Aspirin, carvedilol, Farxiga, losartan, pravastatin, torsemide 20 mg p.o. daily.  She again presented on 05/15/2022 with shortness of breath and received IV furosemide and discharged home.  Patient was seen 6 weeks ago, she now presents for follow-up of acute on chronic diastolic heart failure.  She is now back to her baseline.  Today she is not in acute decompensated heart failure however review of her labs reveal hyponatremia and also mildly elevated proBNP/BNP.  She will continue to be careful with her salt and also fluid intake, continue Farxiga, will change furosemide to as needed use only.  Otherwise from cardiac standpoint she is stable, I will refer her for [facility] for exercise program.  Hopefully with exercise her dyspnea will improve and her exercise tolerance  will improve.  Blood pressure is well controlled.  I will see her back in 3 months for follow-up.    Adrian Prows, MD, [name] 06/22/2022, 11:00 AM Office: [phone] Fax: [phone] Pager: [phone]

## 2022-06-23 ENCOUNTER — Ambulatory Visit (HOSPITAL_COMMUNITY)
Admission: RE | Admit: 2022-06-23 | Discharge: 2022-06-23 | Disposition: A | Discharge status: Home / Self Care | Payer: Medicare Other | Source: Ambulatory Visit | Attending: Physician Assistant | Admitting: Physician Assistant

## 2022-06-23 DIAGNOSIS — D472 Monoclonal gammopathy: Secondary | ICD-10-CM

## 2022-06-23 DIAGNOSIS — M1810 Unilateral primary osteoarthritis of first carpometacarpal joint, unspecified hand: Secondary | ICD-10-CM | POA: Diagnosis not present

## 2022-06-29 ENCOUNTER — Other Ambulatory Visit: Payer: Self-pay | Admitting: Cardiology

## 2022-06-29 ENCOUNTER — Encounter (INDEPENDENT_AMBULATORY_CARE_PROVIDER_SITE_OTHER): Payer: Medicare Other | Admitting: Ophthalmology

## 2022-06-29 DIAGNOSIS — H43813 Vitreous degeneration, bilateral: Secondary | ICD-10-CM

## 2022-06-29 DIAGNOSIS — H35033 Hypertensive retinopathy, bilateral: Secondary | ICD-10-CM

## 2022-06-29 DIAGNOSIS — H35372 Puckering of macula, left eye: Secondary | ICD-10-CM | POA: Diagnosis not present

## 2022-06-29 DIAGNOSIS — I10 Essential (primary) hypertension: Secondary | ICD-10-CM

## 2022-06-29 DIAGNOSIS — H353231 Exudative age-related macular degeneration, bilateral, with active choroidal neovascularization: Secondary | ICD-10-CM | POA: Diagnosis not present

## 2022-06-29 DIAGNOSIS — I5033 Acute on chronic diastolic (congestive) heart failure: Secondary | ICD-10-CM

## 2022-06-30 ENCOUNTER — Ambulatory Visit (HOSPITAL_COMMUNITY): Payer: Medicare Other

## 2022-07-01 ENCOUNTER — Other Ambulatory Visit (HOSPITAL_COMMUNITY)
Admission: RE | Admit: 2022-07-01 | Discharge: 2022-07-01 | Disposition: A | Discharge status: Home / Self Care | Payer: Medicare Other | Source: Ambulatory Visit | Attending: Hematology and Oncology | Admitting: Hematology and Oncology

## 2022-07-01 ENCOUNTER — Other Ambulatory Visit: Payer: Self-pay

## 2022-07-01 DIAGNOSIS — D472 Monoclonal gammopathy: Secondary | ICD-10-CM | POA: Insufficient documentation

## 2022-07-04 LAB — UPEP/UIFE/LIGHT CHAINS/TP, 24-HR UR
% BETA, Urine: 0 %
ALPHA 1 URINE: 0 %
Albumin, U: 100 %
Alpha 2, Urine: 0 %
Free Kappa Lt Chains,Ur: 29.92 mg/L (ref 1.17–86.46)
Free Kappa/Lambda Ratio: 7.11 (ref 1.83–14.26)
Free Lambda Lt Chains,Ur: 4.21 mg/L (ref 0.27–15.21)
GAMMA GLOBULIN URINE: 0 %
Total Protein, Urine-Ur/day: 143 mg/24 hr (ref 30–150)
Total Protein, Urine: 19.1 mg/dL

## 2022-07-10 DIAGNOSIS — E871 Hypo-osmolality and hyponatremia: Secondary | ICD-10-CM | POA: Diagnosis not present

## 2022-07-10 DIAGNOSIS — N390 Urinary tract infection, site not specified: Secondary | ICD-10-CM | POA: Diagnosis not present

## 2022-07-10 DIAGNOSIS — I5030 Unspecified diastolic (congestive) heart failure: Secondary | ICD-10-CM | POA: Diagnosis not present

## 2022-07-10 DIAGNOSIS — N8110 Cystocele, unspecified: Secondary | ICD-10-CM | POA: Diagnosis not present

## 2022-07-10 DIAGNOSIS — N179 Acute kidney failure, unspecified: Secondary | ICD-10-CM | POA: Diagnosis not present

## 2022-07-10 DIAGNOSIS — D649 Anemia, unspecified: Secondary | ICD-10-CM | POA: Diagnosis not present

## 2022-07-10 DIAGNOSIS — I129 Hypertensive chronic kidney disease with stage 1 through stage 4 chronic kidney disease, or unspecified chronic kidney disease: Secondary | ICD-10-CM | POA: Diagnosis not present

## 2022-07-12 ENCOUNTER — Telehealth: Payer: Self-pay

## 2022-07-12 ENCOUNTER — Other Ambulatory Visit: Payer: Self-pay | Admitting: Radiology

## 2022-07-12 ENCOUNTER — Other Ambulatory Visit: Payer: Self-pay

## 2022-07-12 DIAGNOSIS — D472 Monoclonal gammopathy: Secondary | ICD-10-CM

## 2022-07-12 MED ORDER — DAPAGLIFLOZIN PROPANEDIOL 10 MG PO TABS: 10.0000 mg | ORAL_TABLET | Freq: Every day | ORAL | 2 refills | Status: DC

## 2022-07-12 NOTE — Telephone Encounter (Signed)
Patient called and wanted to know if you want her to continue the [name] that was prescribed by another provider. If so, she will need a refill, and I can refill it once you let me know.   Preferred pharmacy : [facility] on [address]

## 2022-07-12 NOTE — Telephone Encounter (Signed)
Patient called and wanted to know if you want her to continue the [name] that was prescribed by another provider. If so, she will need a refill, and I can refill it once you let me know.  [facility] : [facility] on [address]

## 2022-07-12 NOTE — Telephone Encounter (Signed)
Patient aware.

## 2022-07-13 ENCOUNTER — Encounter (HOSPITAL_COMMUNITY): Payer: Self-pay

## 2022-07-13 ENCOUNTER — Ambulatory Visit (HOSPITAL_COMMUNITY)
Admission: RE | Admit: 2022-07-13 | Discharge: 2022-07-13 | Disposition: A | Discharge status: Home / Self Care | Payer: Medicare Other | Source: Ambulatory Visit | Attending: Physician Assistant | Admitting: Physician Assistant

## 2022-07-13 ENCOUNTER — Ambulatory Visit (HOSPITAL_COMMUNITY)
Admission: RE | Admit: 2022-07-13 | Discharge: 2022-07-13 | Disposition: A | Discharge status: Home / Self Care | Payer: Medicare Other | Source: Ambulatory Visit

## 2022-07-13 DIAGNOSIS — H3530 Unspecified macular degeneration: Secondary | ICD-10-CM | POA: Diagnosis not present

## 2022-07-13 DIAGNOSIS — D472 Monoclonal gammopathy: Secondary | ICD-10-CM | POA: Diagnosis not present

## 2022-07-13 DIAGNOSIS — Z1379 Encounter for other screening for genetic and chromosomal anomalies: Secondary | ICD-10-CM | POA: Diagnosis not present

## 2022-07-13 DIAGNOSIS — E039 Hypothyroidism, unspecified: Secondary | ICD-10-CM | POA: Diagnosis not present

## 2022-07-13 DIAGNOSIS — E538 Deficiency of other specified B group vitamins: Secondary | ICD-10-CM | POA: Diagnosis not present

## 2022-07-13 DIAGNOSIS — Z952 Presence of prosthetic heart valve: Secondary | ICD-10-CM | POA: Diagnosis not present

## 2022-07-13 DIAGNOSIS — D630 Anemia in neoplastic disease: Secondary | ICD-10-CM | POA: Diagnosis not present

## 2022-07-13 DIAGNOSIS — I6523 Occlusion and stenosis of bilateral carotid arteries: Secondary | ICD-10-CM | POA: Diagnosis not present

## 2022-07-13 DIAGNOSIS — I509 Heart failure, unspecified: Secondary | ICD-10-CM | POA: Insufficient documentation

## 2022-07-13 DIAGNOSIS — K219 Gastro-esophageal reflux disease without esophagitis: Secondary | ICD-10-CM | POA: Diagnosis present

## 2022-07-13 DIAGNOSIS — Z853 Personal history of malignant neoplasm of breast: Secondary | ICD-10-CM | POA: Diagnosis present

## 2022-07-13 DIAGNOSIS — C9000 Multiple myeloma not having achieved remission: Secondary | ICD-10-CM | POA: Insufficient documentation

## 2022-07-13 DIAGNOSIS — D649 Anemia, unspecified: Secondary | ICD-10-CM | POA: Diagnosis not present

## 2022-07-13 DIAGNOSIS — E785 Hyperlipidemia, unspecified: Secondary | ICD-10-CM | POA: Diagnosis not present

## 2022-07-13 DIAGNOSIS — D4989 Neoplasm of unspecified behavior of other specified sites: Secondary | ICD-10-CM | POA: Diagnosis not present

## 2022-07-13 DIAGNOSIS — I110 Hypertensive heart disease with heart failure: Secondary | ICD-10-CM | POA: Diagnosis not present

## 2022-07-13 LAB — CBC WITH DIFFERENTIAL/PLATELET
Abs Immature Granulocytes: 0.1 10*3/uL — ABNORMAL HIGH (ref 0.00–0.07)
Basophils Absolute: 0 10*3/uL (ref 0.0–0.1)
Basophils Relative: 1 %
Eosinophils Absolute: 0.1 10*3/uL (ref 0.0–0.5)
Eosinophils Relative: 1 %
HCT: 27.6 % — ABNORMAL LOW (ref 36.0–46.0)
Hemoglobin: 8.5 g/dL — ABNORMAL LOW (ref 12.0–15.0)
Immature Granulocytes: 2 %
Lymphocytes Relative: 17 %
Lymphs Abs: 1.2 10*3/uL (ref 0.7–4.0)
MCH: 27.2 pg (ref 26.0–34.0)
MCHC: 30.8 g/dL (ref 30.0–36.0)
MCV: 88.2 fL (ref 80.0–100.0)
Monocytes Absolute: 1 10*3/uL (ref 0.1–1.0)
Monocytes Relative: 15 %
Neutro Abs: 4.3 10*3/uL (ref 1.7–7.7)
Neutrophils Relative %: 64 %
Platelets: 184 10*3/uL (ref 150–400)
RBC: 3.13 MIL/uL — ABNORMAL LOW (ref 3.87–5.11)
RDW: 21.8 % — ABNORMAL HIGH (ref 11.5–15.5)
WBC: 6.7 10*3/uL (ref 4.0–10.5)
nRBC: 0 % (ref 0.0–0.2)

## 2022-07-13 MED ORDER — NALOXONE HCL 0.4 MG/ML IJ SOLN
INTRAMUSCULAR | Status: AC
  Filled 2022-07-13: qty 1

## 2022-07-13 MED ORDER — FLUMAZENIL 0.5 MG/5ML IV SOLN
INTRAVENOUS | Status: AC
  Filled 2022-07-13: qty 5

## 2022-07-13 MED ORDER — MIDAZOLAM HCL 2 MG/2ML IJ SOLN
INTRAMUSCULAR | Status: AC
  Filled 2022-07-13: qty 2

## 2022-07-13 MED ORDER — SODIUM CHLORIDE 0.9 % IV SOLN: INTRAVENOUS | Status: DC

## 2022-07-13 MED ORDER — MIDAZOLAM HCL 2 MG/2ML IJ SOLN
INTRAMUSCULAR | Status: AC | PRN
  Administered 2022-07-13: .5 mg via INTRAVENOUS

## 2022-07-13 MED ORDER — FENTANYL CITRATE (PF) 100 MCG/2ML IJ SOLN
INTRAMUSCULAR | Status: AC | PRN
  Administered 2022-07-13: 25 ug via INTRAVENOUS

## 2022-07-13 MED ORDER — FENTANYL CITRATE (PF) 100 MCG/2ML IJ SOLN
INTRAMUSCULAR | Status: AC
  Filled 2022-07-13: qty 2

## 2022-07-13 NOTE — Discharge Instructions (Signed)
Please call Interventional Radiology clinic [phone] with any questions or concerns.  You may remove your dressing and shower tomorrow.   Moderate Conscious Sedation, Adult, Care After This sheet gives you information about how to care for yourself after your procedure. Your health care provider may also give you more specific instructions. If you have problems or questions, contact your health careprovider. What can I expect after the procedure? After the procedure, it is common to have: Sleepiness for several hours. Impaired judgment for several hours. Difficulty with balance. Vomiting if you eat too soon. Follow these instructions at home: For the time period you were told by your health care provider: Rest. Do not participate in activities where you could fall or become injured. Do not drive or use machinery. Do not drink alcohol. Do not take sleeping pills or medicines that cause drowsiness. Do not make important decisions or sign legal documents. Do not take care of children on your own. Eating and drinking  Follow the diet recommended by your health care provider. Drink enough fluid to keep your urine pale yellow. If you vomit: Drink water, juice, or soup when you can drink without vomiting. Make sure you have little or no nausea before eating solid foods.  General instructions Take over-the-counter and prescription medicines only as told by your health care provider. Have a responsible adult stay with you for the time you are told. It is important to have someone help care for you until you are awake and alert. Do not smoke. Keep all follow-up visits as told by your health care provider. This is important. Contact a health care provider if: You are still sleepy or having trouble with balance after 24 hours. You feel light-headed. You keep feeling nauseous or you keep vomiting. You develop a rash. You have a fever. You have redness or swelling around the IV  site. Get help right away if: You have trouble breathing. You have new-onset confusion at home. Summary After the procedure, it is common to feel sleepy, have impaired judgment, or feel nauseous if you eat too soon. Rest after you get home. Know the things you should not do after the procedure. Follow the diet recommended by your health care provider and drink enough fluid to keep your urine pale yellow. Get help right away if you have trouble breathing or new-onset confusion at home. This information is not intended to replace advice given to you by your health care provider. Make sure you discuss any questions you have with your healthcare provider. Document Revised: 02/06/2020 Document Reviewed: 09/04/2019 Elsevier Patient Education  2022 Else[employer] Inc.   Bone Marrow Aspiration and Bone Marrow Biopsy, Adult, Care After This sheet gives you information about how to care for yourself after your procedure. Your health care provider may also give you more specific instructions. If you have problems or questions, contact your health careprovider. What can I expect after the procedure? After the procedure, it is common to have: Mild pain and tenderness. Swelling. Bruising. Follow these instructions at home: Puncture site care Follow instructions from your health care provider about how to take care of the puncture site. Make sure you: Wash your hands with soap and water before and after you change your bandage (dressing). If soap and water are not available, use hand sanitizer. Change your dressing as told by your health care provider. Check your puncture site every day for signs of infection. Check for: More redness, swelling, or pain. Fluid or blood. Warmth. Pus or a   bad smell.  Activity Return to your normal activities as told by your health care provider. Ask your health care provider what activities are safe for you. Do not lift anything that is heavier than 10 lb (4.5 kg), or the  limit that you are told, until your health care provider says that it is safe. Do not drive for 24 hours if you were given a sedative during your procedure. General instructions Take over-the-counter and prescription medicines only as told by your health care provider. Do not take baths, swim, or use a hot tub until your health care provider approves. Ask your health care provider if you may take showers. You may only be allowed to take sponge baths. If directed, put ice on the affected area. To do this: Put ice in a plastic bag. Place a towel between your skin and the bag. Leave the ice on for 20 minutes, 2-3 times a day. Keep all follow-up visits as told by your health care provider. This is important.  Contact a health care provider if: Your pain is not controlled with medicine. You have a fever. You have more redness, swelling, or pain around the puncture site. You have fluid or blood coming from the puncture site. Your puncture site feels warm to the touch. You have pus or a bad smell coming from the puncture site. Summary After the procedure, it is common to have mild pain, tenderness, swelling, and bruising. Follow instructions from your health care provider about how to take care of the puncture site and what activities are safe for you. Take over-the-counter and prescription medicines only as told by your health care provider. Contact a health care provider if you have any signs of infection, such as fluid or blood coming from the puncture site. This information is not intended to replace advice given to you by your health care provider. Make sure you discuss any questions you have with your healthcare provider. Document Revised: 02/25/2019 Document Reviewed: 02/25/2019 Elsevier Patient Education  [place].

## 2022-07-13 NOTE — Procedures (Signed)
Interventional Radiology Procedure Note  Procedure: CT guided aspirate and core biopsy of right iliac bone Complications: None Recommendations: - Bedrest supine x 1 hrs - Hydrocodone PRN  Pain - Follow biopsy results  Signed,  Elster Corbello K. Tye Juarez, MD   

## 2022-07-13 NOTE — Consult Note (Signed)
Chief Complaint: Patient was seen in consultation today for  CT guided bone marrow biopsy  Referring Physician(s): Dorsey,J  Supervising Physician: Jacqulynn Cadet  Patient Status: [facility] - Out-pt  History of Present Illness: [name] is an 88 y.o. female with PMH sig for left breast cancer with prior lumpectomy, GERD, nephrolithiasis, hyperlipidemia, hypertension, hypothyroidism, macular degeneration, severe aortic stenosis with prior TAVR, bilateral carotid artery stenosis, and heart failure. She presents now with  normocytic anemia of unknown etiology and lab findings of vitamin B 12 deficiency and monoclonal gammopathy with an M-spike detected.  She is scheduled today for CT-guided bone marrow biopsy for further evaluation.  Past Medical History:  Diagnosis Date   Arthritis    some - per patient   Breast cancer ([place])    breast cancer / left    Cataract    bilat    GERD (gastroesophageal reflux disease)    History of kidney stones    Hyperlipidemia    Hypertension    Hypothyroidism    Macular degeneration    Left   S/P TAVR (transcatheter aortic valve replacement) 09/03/2018   23 mm Edwards Sapien 3 transcatheter heart valve placed via percutaneous right transfemoral approach    Severe aortic stenosis    Stress incontinence    Thyroid disease    Tinnitus     Past Surgical History:  Procedure Laterality Date   ABDOMINAL HYSTERECTOMY  1970's   BACK SURGERY     BREAST LUMPECTOMY  12/1998   lumpectomy   CARDIAC CATHETERIZATION     EYE SURGERY     cataract surgery bilat    INTRAOPERATIVE TRANSTHORACIC ECHOCARDIOGRAM N/A 09/03/2018   Procedure: INTRAOPERATIVE TRANSTHORACIC ECHOCARDIOGRAM;  Surgeon: Burnell Blanks, MD;  Location: [place];  Service: Open Heart Surgery;  Laterality: N/A;   LITHOTRIPSY     Right total knee     2018 Dr. Alvan Dame   RIGHT/LEFT HEART CATH AND CORONARY ANGIOGRAPHY N/A 08/06/2018   Procedure: RIGHT/LEFT HEART CATH AND CORONARY  ANGIOGRAPHY;  Surgeon: Adrian Prows, MD;  Location: [place] [facility];  Service: Cardiovascular;  Laterality: N/A;   THYROIDECTOMY, PARTIAL  1975   TONSILLECTOMY     as a child - patient not sure of exact date   TOTAL KNEE ARTHROPLASTY Left 03/13/2016   Procedure: TOTAL KNEE ARTHROPLASTY;  Surgeon: Paralee Cancel, MD;  Location: [facility];  Service: Orthopedics;  Laterality: Left;   TOTAL KNEE ARTHROPLASTY Right 06/18/2017   Procedure: RIGHT TOTAL KNEE ARTHROPLASTY;  Surgeon: Paralee Cancel, MD;  Location: [facility];  Service: Orthopedics;  Laterality: Right;   TRANSCATHETER AORTIC VALVE REPLACEMENT, TRANSFEMORAL N/A 09/03/2018   Procedure: TRANSCATHETER AORTIC VALVE REPLACEMENT, TRANSFEMORAL;  Surgeon: Burnell Blanks, MD;  Location: [place];  Service: Open Heart Surgery;  Laterality: N/A;    Allergies: Penicillins and Sulfa antibiotics  Medications: Prior to Admission medications   Medication Sig Start Date End Date Taking? Authorizing Provider  Artificial Tear Solution (SOOTHE XP) SOLN Place 1 drop into both eyes every evening.    [provider]  aspirin 81 MG chewable tablet Chew 1 tablet (81 mg total) by mouth daily. Patient taking differently: Chew 81 mg by mouth every evening. 09/05/18   Eileen Stanford, PA-C  carvedilol (COREG) 12.5 MG tablet Take 12.5 mg by mouth 2 (two) times daily with a meal.    [provider]  Cholecalciferol (VITAMIN D3) 2000 units TABS Take 2,000 Units by mouth daily.    [provider]  cyanocobalamin (VITAMIN B12) 1000 MCG tablet Take 1 tablet (1,000 mcg total) by mouth daily. 06/20/22   Lincoln Brigham, PA-C  dapagliflozin propanediol (FARXIGA) 10 MG TABS tablet Take 1 tablet (10 mg total) by mouth daily. 07/12/22   Adrian Prows, MD  esomeprazole (NEXIUM) 20 MG capsule Take 20 mg by mouth daily. 12/06/21   [provider]  ferrous sulfate 325 (65 FE) MG tablet Take 325 mg by mouth daily with breakfast.    [provider]  furosemide (LASIX) 20 MG tablet PLEASE SEE ATTACHED FOR DETAILED DIRECTIONS 06/29/22   Adrian Prows, MD  levothyroxine (SYNTHROID, LEVOTHROID) 100 MCG tablet Take 100 mcg by mouth daily before breakfast. 12/29/15   [provider]  losartan (COZAAR) 25 MG tablet Take 1 tablet (25 mg total) by mouth daily. 06/12/22   Adrian Prows, MD  Multiple Vitamins-Minerals (PRESERVISION AREDS 2) CAPS Take 1 capsule by mouth 2 (two) times daily.    [provider]  potassium chloride (KLOR-CON M) 10 MEQ tablet Take 2 tablets (20 mEq total) by mouth daily as needed. Take with furosemide 06/22/22 09/20/22  Adrian Prows, MD  pravastatin (PRAVACHOL) 40 MG tablet Take 40 mg by mouth every evening.    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  Tobacco Use   Smoking status: Never   Smokeless tobacco: Never  Vaping Use   Vaping Use: Never used  Substance and Sexual Activity   Alcohol use: No   Drug use: No   Sexual activity: Not Currently  Other Topics Concern   Not on file  Social History Narrative   Not on file   Social Determinants of Health   Financial Resource Strain: Low Risk  (11/12/2018)   Overall Financial Resource Strain (CARDIA)    Difficulty of Paying Living Expenses: Not very hard  Food Insecurity: No Food Insecurity (11/12/2018)   Hunger Vital Sign    Worried About Running Out of Food in the Last Year: Never true    Ran Out of Food in the Last Year:  Never true  Transportation Needs: No Transportation Needs (11/12/2018)   PRAPARE - Hydrologist (Medical): No    Lack of Transportation (Non-Medical): No  Physical Activity: Inactive (11/12/2018)   Exercise Vital Sign    Days of Exercise per Week: 0 days    Minutes of Exercise per Session: 0 min  Stress: Stress Concern Present (11/12/2018)   [place]    Feeling of Stress : To some extent  Social Connections: Not on file      Review of Systems denies fever,HA,CP, dyspnea, abd pain, N/V or bleeding; she does have back pain, occ cough  Vital Signs:pending       Physical Exam awake/alert; chest- CTA bilat; heart- RRR;  abd- soft,+BS,NT; no LE edema  Imaging: DG Bone Survey Met  Result Date: 06/23/2022 CLINICAL DATA:  Evaluate for lytic lesions EXAM: METASTATIC BONE SURVEY COMPARISON:  None Available. FINDINGS: Images of the axial and appendicular skeleton are performed. Calvarium: No evidence of lytic lesions. Diffuse demineralization. No acute osseous abnormality. Chest and Abdomen: Prior transcatheter aortic valve replacement. Scattered linear opacities which are likely due to atelectasis, lungs are otherwise clear. Cardiac and mediastinal contours are within normal limits. Nonobstructive bowel-gas pattern of the visualized abdomen. Aortoiliac calcifications. Spine::No evidence of lytic lesions. Diffuse demineralization. Prior T12 vertebroplasty. Multilevel degenerative disc disease. Multilevel facet arthropathy which is most pronounced in the lumbar spine. No acute osseous abnormality. UPPER extremities: No evidence of lytic lesions. Diffuse demineralization. Degenerative changes of the bilateral first CMC and triscaphe joints. No acute osseous abnormality. Pelvis and LOWER extremities: No evidence of lytic lesions. Diffuse demineralization. Prior bilateral total knee arthroplasties. Vascular  calcifications. No acute osseous abnormality. IMPRESSION: No aggressive appearing osseous lesions. Electronically Signed   By: Yetta Glassman M.D.   On: 06/23/2022 11:16    Labs:  CBC: Recent Labs    04/05/22 0342 05/15/22 2020 05/16/22 1243 05/16/22 2219 05/17/22 0129 05/18/22 0134 06/14/22 1217  WBC 7.9 7.6  --   --  8.0  --  6.5  HGB 8.4* 7.1*   < > 8.9* 8.6* 8.8* 9.3*  HCT 26.2* 22.6*   < > 27.3* 26.7* 28.1* 28.2*  PLT 228 192  --   --  186  --  183   < > = values in this interval not displayed.    COAGS: No results for input(s): "INR", "APTT" in the last 8760 hours.  BMP: Recent Labs    05/16/22 0603 05/17/22 0129 05/18/22 0134 06/14/22 1217 06/19/22 1012  NA 132* 132* 131* 129* 130*  K 3.2* 4.2 4.9 4.1 4.3  CL 97* 97* 99 96* 95*  CO2 _0 GLUCOSE 97 109* 102* 101* 98  BUN 10 17 30* 20 21  CALCIUM 8.0* 8.1* 8.4* 8.5* 8.7  CREATININE 0.76 0.98 1.14* 0.82 0.94  GFRNONAA >60 56* 47* >60  --     LIVER FUNCTION TESTS: Recent Labs    05/16/22 0603 06/14/22 1217  BILITOT 0.7 0.5  AST 19 21  ALT 12 11  ALKPHOS 47 53  PROT 8.5* 10.3*  ALBUMIN 2.0* 2.8*    TUMOR MARKERS: No results for input(s): "AFPTM", "CEA", "CA199", "CHROMGRNA" in the last 8760 hours.  Assessment and Plan: 88 y.o. female with PMH sig for left breast cancer with prior lumpectomy, GERD, nephrolithiasis, hyperlipidemia, hypertension, hypothyroidism, macular degeneration, severe aortic stenosis with prior TAVR, bilateral carotid artery stenosis, and heart failure. She presents now with  normocytic anemia of unknown etiology and lab findings of vitamin B 12 deficiency and monoclonal gammopathy with an M-spike detected.  She is scheduled today for CT-guided bone marrow biopsy for further evaluation.Risks and benefits of procedure was discussed with the patient  including, but not limited to bleeding, infection, damage to adjacent structures or low yield requiring additional  tests.  All of the questions were answered and there is agreement to proceed.  Consent signed and in chart.    Thank you for this interesting consult.  I greatly enjoyed meeting Zoe [name] and look forward to participating in their care.  A copy of this report was sent to the requesting provider on this date.  Electronically Signed: D. Rowe Robert, PA-C 07/13/2022, 9:06  AM   I spent a total of  15 Minutes   in face to face in clinical consultation, greater than 50% of which was counseling/coordinating care for CT guided bone marrow biopsy

## 2022-07-14 NOTE — Progress Notes (Signed)
Labs 07/10/2022:  TSH 0.503, normal.  Serum osmolality 287, urinary osmolality 01/30/2020.  Serum glucose _0 mg, BUN 27, creatinine 0.97, EGFR 57.  mL, sodium 133, potassium 4.2, LFTs normal.  Phosphorus normal at 4.1.  Hemoglobin 9.3/HCT 29.8.  Microcytic indicis.  Platelets 199.

## 2022-07-18 ENCOUNTER — Telehealth: Payer: Self-pay | Admitting: Physician Assistant

## 2022-07-18 LAB — SURGICAL PATHOLOGY

## 2022-07-18 NOTE — Telephone Encounter (Signed)
I called Zoe [name] to review the bone marrow biopsy results. Pathology confirmed 60% plasma cells consistent with diagnosis of multiple myeloma. Patient will follow up with Dr. Lorenso Courier on Monday, 07/24/2022 at 1pm to review diagnosis and treatment recommendations. She expressed understanding of the plan provided.

## 2022-07-20 ENCOUNTER — Other Ambulatory Visit: Payer: Medicare Other

## 2022-07-21 ENCOUNTER — Telehealth: Payer: Self-pay

## 2022-07-21 NOTE — Telephone Encounter (Signed)
T/C from pt stating she saw her PCP for a cough and cold symptoms. Covid test was negative.  Nasal drainage and productive cough are both clear.  No fever or shortness of breath.  She was advised by her PCP to take OTC mucinex ER and use a nasal spray.  Pt advised to increase her fluid intake.  She will monitor for signs of infection and will go to a walk in clinic if needed.    She has an appt on Monday 10/2 and was advised to cancel if she has a fever.  Pt agreed with this plan

## 2022-07-24 ENCOUNTER — Inpatient Hospital Stay: Payer: Medicare Other | Attending: Physician Assistant | Admitting: Hematology and Oncology

## 2022-07-24 ENCOUNTER — Other Ambulatory Visit: Payer: Self-pay

## 2022-07-24 ENCOUNTER — Encounter (HOSPITAL_COMMUNITY): Payer: Self-pay

## 2022-07-24 VITALS — BP 113/59 | HR 78 | Temp 97.9°F | Resp 15 | Wt 162.9 lb

## 2022-07-24 DIAGNOSIS — E538 Deficiency of other specified B group vitamins: Secondary | ICD-10-CM | POA: Diagnosis not present

## 2022-07-24 DIAGNOSIS — E871 Hypo-osmolality and hyponatremia: Secondary | ICD-10-CM | POA: Diagnosis not present

## 2022-07-24 DIAGNOSIS — Z5112 Encounter for antineoplastic immunotherapy: Secondary | ICD-10-CM | POA: Diagnosis not present

## 2022-07-24 DIAGNOSIS — R34 Anuria and oliguria: Secondary | ICD-10-CM | POA: Diagnosis not present

## 2022-07-24 DIAGNOSIS — M546 Pain in thoracic spine: Secondary | ICD-10-CM | POA: Insufficient documentation

## 2022-07-24 DIAGNOSIS — C9000 Multiple myeloma not having achieved remission: Secondary | ICD-10-CM | POA: Diagnosis not present

## 2022-07-24 DIAGNOSIS — I5033 Acute on chronic diastolic (congestive) heart failure: Secondary | ICD-10-CM | POA: Diagnosis not present

## 2022-07-24 DIAGNOSIS — I110 Hypertensive heart disease with heart failure: Secondary | ICD-10-CM | POA: Insufficient documentation

## 2022-07-24 DIAGNOSIS — D649 Anemia, unspecified: Secondary | ICD-10-CM | POA: Diagnosis not present

## 2022-07-24 DIAGNOSIS — D72829 Elevated white blood cell count, unspecified: Secondary | ICD-10-CM | POA: Insufficient documentation

## 2022-07-24 MED ORDER — DEXAMETHASONE 4 MG PO TABS: 20.0000 mg | ORAL_TABLET | ORAL | 5 refills | Status: DC

## 2022-07-24 MED ORDER — PROCHLORPERAZINE MALEATE 10 MG PO TABS: 10.0000 mg | ORAL_TABLET | Freq: Four times a day (QID) | ORAL | 0 refills | Status: DC | PRN

## 2022-07-24 MED ORDER — ONDANSETRON HCL 8 MG PO TABS: 8.0000 mg | ORAL_TABLET | Freq: Three times a day (TID) | ORAL | 0 refills | Status: DC | PRN

## 2022-07-24 MED ORDER — ACYCLOVIR 400 MG PO TABS: 400.0000 mg | ORAL_TABLET | Freq: Two times a day (BID) | ORAL | 3 refills | Status: DC

## 2022-07-24 NOTE — Progress Notes (Signed)
START ON PATHWAY REGIMEN - Multiple Myeloma and Other Plasma Cell Dyscrasias ° ° °  A cycle is every 21 days: °    Bortezomib  °    Lenalidomide  °    Dexamethasone  ° °**Always confirm dose/schedule in your pharmacy ordering system** ° °Patient Characteristics: °Multiple Myeloma, Newly Diagnosed, Transplant Ineligible or Refused, Unknown or Awaiting Test Results °Disease Classification: Multiple Myeloma °R-ISS Staging: II °Therapeutic Status: Newly Diagnosed °Is Patient Eligible for Transplant<= Transplant Ineligible or Refused °Risk Status: Awaiting Test Results °Intent of Therapy: °Curative Intent, Discussed with Patient °

## 2022-07-24 NOTE — Progress Notes (Signed)
[place] [phone]   [phone]  PROGRESS NOTE  Patient Care Team: [facility] as PCP - General (Rheumatology)  Hematological/Oncological History # IgG Lambda Multiple Myeloma  06/14/2022: establish care with Zoe [name] due to anemia. Labs showed M protein 4.9, Kappa 7.2, Lambda 10.8, ratio 0.67 07/13/2022: Bmbx showed Lambda restricted plasma cell neoplasm involving approximately 60% of the cellular marrow by IHC on the biopsy.   Interval History:  [name] 88 y.o. female with medical history significant for newly diagnosed multiple myeloma who presents for a follow up visit. The patient's last visit was on 06/14/2022 with Zoe [name]. In the interim since the last visit she had a bone marrow biopsy which confirmed a diagnosis of multiple myeloma.   On exam today Zoe [name] reports that she is currently having some viral symptoms.  She is a deep chest cough and nasal drainage.  She has been taking Mucinex for the last 5 days.  She has tested negative for COVID.  She notes that she tolerated her bone marrow biopsy procedure well and did not have any of the "anticipated pain".  She does that she does have some bruising and some raised bump like area at the site of the bone marrow biopsy.  Otherwise she has been quite well.  She denies any fevers, chills, sweats, nausea, vomiting, diarrhea.  Full 10 point ROS was otherwise negative.  The bulk of our discussion focused on the diagnosis of multiple myeloma and the steps moving forward.  The patient voiced understanding and was willing and able to proceed with treatment.  The details of this conversation are noted below.  MEDICAL HISTORY:  Past Medical History:  Diagnosis Date   Arthritis    some - per patient   Breast cancer ([place])    breast cancer / left    Cataract    bilat    GERD (gastroesophageal reflux disease)    History of kidney stones    Hyperlipidemia     Hypertension    Hypothyroidism    Macular degeneration    Left   S/P TAVR (transcatheter aortic valve replacement) 09/03/2018   23 mm Edwards Sapien 3 transcatheter heart valve placed via percutaneous right transfemoral approach    Severe aortic stenosis    Stress incontinence    Thyroid disease    Tinnitus     SURGICAL HISTORY: Past Surgical History:  Procedure Laterality Date   ABDOMINAL HYSTERECTOMY  1970's   BACK SURGERY     BREAST LUMPECTOMY  12/1998   lumpectomy   CARDIAC CATHETERIZATION     EYE SURGERY     cataract surgery bilat    INTRAOPERATIVE TRANSTHORACIC ECHOCARDIOGRAM N/A 09/03/2018   Procedure: INTRAOPERATIVE TRANSTHORACIC ECHOCARDIOGRAM;  Surgeon: Zoe Blanks, MD;  Location: [facility];  Service: Open Heart Surgery;  Laterality: N/A;   LITHOTRIPSY     Right total knee     2018 Dr. Alvan [name]   RIGHT/LEFT HEART CATH AND CORONARY ANGIOGRAPHY N/A 08/06/2018   Procedure: RIGHT/LEFT HEART CATH AND CORONARY ANGIOGRAPHY;  Surgeon: Zoe Prows, MD;  Location: [facility];  Service: Cardiovascular;  Laterality: N/A;   THYROIDECTOMY, PARTIAL  1975   TONSILLECTOMY     as a child - patient not sure of exact date   TOTAL KNEE ARTHROPLASTY Left 03/13/2016   Procedure: TOTAL KNEE ARTHROPLASTY;  Surgeon: Zoe Cancel, MD;  Location: [facility];  Service: Orthopedics;  Laterality: Left;   TOTAL KNEE ARTHROPLASTY Right 06/18/2017  Procedure: RIGHT TOTAL KNEE ARTHROPLASTY;  Surgeon: Zoe Cancel, MD;  Location: [facility];  Service: Orthopedics;  Laterality: Right;   TRANSCATHETER AORTIC VALVE REPLACEMENT, TRANSFEMORAL N/A 09/03/2018   Procedure: TRANSCATHETER AORTIC VALVE REPLACEMENT, TRANSFEMORAL;  Surgeon: Zoe Blanks, MD;  Location: [facility];  Service: Open Heart Surgery;  Laterality: N/A;    SOCIAL HISTORY: Social History   Socioeconomic History   Marital status: Widowed    Spouse name: Not on file   Number of children: 4   Years of education: Not on file    Highest education level: Master's degree (e.g., MA, MS, MEng, MEd, MSW, MBA)  Occupational History   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  Tobacco Use   Smoking status: Never   Smokeless tobacco: Never  Vaping Use   Vaping Use: Never used  Substance and Sexual Activity   Alcohol use: No   Drug use: No   Sexual activity: Not Currently  Other Topics Concern   Not on file  Social History Narrative   Not on file   Social Determinants of Health   Financial Resource Strain: Low Risk  (11/12/2018)   Overall Financial Resource Strain (CARDIA)    Difficulty of Paying Living Expenses: Not very hard  Food Insecurity: No Food Insecurity (11/12/2018)   Hunger Vital Sign    Worried About Running Out of Food in the Last Year: Never true    Ran Out of Food in the Last Year: Never true  Transportation Needs: No Transportation Needs (11/12/2018)   PRAPARE - Hydrologist (Medical): No    Lack of Transportation (Non-Medical): No  Physical Activity: Inactive (11/12/2018)   Exercise Vital Sign    Days of Exercise per Week: 0 days    Minutes of Exercise per Session: 0 min  Stress: Stress Concern Present (11/12/2018)   [place]    Feeling of Stress : To some extent  Social Connections: Not on file  Intimate Partner Violence: Not on file    FAMILY HISTORY: Family History  Problem Relation Age of Onset   Diabetes Mother    Stroke Mother        Carotid artery disease   Heart disease Father        CAD   Coronary artery disease Father    Diabetes Sister     ALLERGIES:  is allergic to penicillins and sulfa antibiotics.  MEDICATIONS:  Current Outpatient Medications  Medication Sig Dispense Refill   acyclovir (ZOVIRAX) 400 MG tablet Take 1 tablet (400 mg total) by mouth 2 (two) times daily. 180 tablet 3   dexamethasone (DECADRON) 4 MG tablet Take 5 tablets (20 mg total)  by mouth once a week. Take 5 tablets in the morning on days when receiving the chemotherapy shot. 20 tablet 5   ondansetron (ZOFRAN) 8 MG tablet Take 1 tablet (8 mg total) by mouth every 8 (eight) hours as needed. 30 tablet 0   prochlorperazine (COMPAZINE) 10 MG tablet Take 1 tablet (10 mg total) by mouth every 6 (six) hours as needed for nausea or vomiting. 30 tablet 0   Artificial Tear Solution (SOOTHE XP) SOLN Place 1 drop into both eyes every evening.     aspirin 81 MG chewable tablet Chew 1 tablet (81 mg total) by mouth daily. (Patient taking differently: Chew 81 mg by mouth every evening.)     carvedilol (COREG) 12.5 MG tablet Take 12.5 mg by mouth  2 (two) times daily with a meal.     Cholecalciferol (VITAMIN D3) 2000 units TABS Take 2,000 Units by mouth daily.     cyanocobalamin (VITAMIN B12) 1000 MCG tablet Take 1 tablet (1,000 mcg total) by mouth daily. 30 tablet 3   dapagliflozin propanediol (FARXIGA) 10 MG TABS tablet Take 1 tablet (10 mg total) by mouth daily. 30 tablet 2   esomeprazole (NEXIUM) 20 MG capsule Take 20 mg by mouth daily.     ferrous sulfate 325 (65 FE) MG tablet Take 325 mg by mouth daily with breakfast.     furosemide (LASIX) 20 MG tablet PLEASE SEE ATTACHED FOR DETAILED DIRECTIONS 90 tablet 1   levothyroxine (SYNTHROID, LEVOTHROID) 100 MCG tablet Take 100 mcg by mouth daily before breakfast.  2   losartan (COZAAR) 25 MG tablet Take 1 tablet (25 mg total) by mouth daily. 90 tablet 1   Multiple Vitamins-Minerals (PRESERVISION AREDS 2) CAPS Take 1 capsule by mouth 2 (two) times daily.     potassium chloride (KLOR-CON M) 10 MEQ tablet Take 2 tablets (20 mEq total) by mouth daily as needed. Take with furosemide 180 tablet 0   pravastatin (PRAVACHOL) 40 MG tablet Take 40 mg by mouth every evening.     No current facility-administered medications for this visit.    REVIEW OF SYSTEMS:   Constitutional: ( - ) fevers, ( - )  chills , ( - ) night sweats Eyes: ( - ) blurriness  of vision, ( - ) double vision, ( - ) watery eyes Ears, nose, mouth, throat, and face: ( - ) mucositis, ( - ) sore throat Respiratory: ( - ) cough, ( - ) dyspnea, ( - ) wheezes Cardiovascular: ( - ) palpitation, ( - ) chest discomfort, ( - ) lower extremity swelling Gastrointestinal:  ( - ) nausea, ( - ) heartburn, ( - ) change in bowel habits Skin: ( - ) abnormal skin rashes Lymphatics: ( - ) new lymphadenopathy, ( - ) easy bruising Neurological: ( - ) numbness, ( - ) tingling, ( - ) new weaknesses Behavioral/Psych: ( - ) mood change, ( - ) new changes  All other systems were reviewed with the patient and are negative.  PHYSICAL EXAMINATION: ECOG PERFORMANCE STATUS: 1 - Symptomatic but completely ambulatory  Vitals:   07/24/22 1301  BP: (!) 113/59  Pulse: 78  Resp: 15  Temp: 97.9 F (36.6 C)  SpO2: 98%   Filed Weights   07/24/22 1301  Weight: 162 lb 14.4 oz (73.9 kg)    GENERAL: well appearing elderly Caucasian female, alert, no distress and comfortable SKIN: skin color, texture, turgor are normal, no rashes or significant lesions EYES: conjunctiva are pink and non-injected, sclera clear LUNGS: clear to auscultation and percussion with normal breathing effort HEART: regular rate & rhythm and no murmurs and no lower extremity edema Musculoskeletal: no cyanosis of digits and no clubbing  PSYCH: alert & oriented x 3, fluent speech NEURO: no focal motor/sensory deficits  LABORATORY DATA:  I have reviewed the data as listed    Latest Ref Rng & Units 07/13/2022   10:25 AM 06/14/2022   12:17 PM 05/18/2022    1:34 AM  CBC  WBC 4.0 - 10.5 K/uL 6.7  6.5    Hemoglobin 12.0 - 15.0 g/dL 8.5  9.3  8.8   Hematocrit 36.0 - 46.0 % 27.6  28.2  28.1   Platelets 150 - 400 K/uL 184  183  Latest Ref Rng & Units 06/19/2022   10:12 AM 06/14/2022   12:17 PM 05/18/2022    1:34 AM  CMP  Glucose 70 - 99 mg/dL 98  101  102   BUN 8 - 27 mg/dL '21  20  30   ' Creatinine 0.57 - 1.00 mg/dL  0.94  0.82  1.14   Sodium 134 - 144 mmol/L 130  129  131   Potassium 3.5 - 5.2 mmol/L 4.3  4.1  4.9   Chloride 96 - 106 mmol/L 95  96  99   CO2 20 - 29 mmol/L '23  27  23   ' Calcium 8.7 - 10.3 mg/dL 8.7  8.5  8.4   Total Protein 6.5 - 8.1 g/dL  10.3    Total Bilirubin 0.3 - 1.2 mg/dL  0.5    Alkaline Phos 38 - 126 U/L  53    AST 15 - 41 U/L  21    ALT 0 - 44 U/L  11      Lab Results  Component Value Date   MPROTEIN 4.9 (H) 06/14/2022   Lab Results  Component Value Date   KPAFRELGTCHN 7.2 06/14/2022   LAMBDASER 10.8 06/14/2022   KAPLAMBRATIO 7.11 07/01/2022   KAPLAMBRATIO 0.67 06/14/2022    RADIOGRAPHIC STUDIES: CT BONE MARROW BIOPSY & ASPIRATION  Result Date: 07/13/2022 INDICATION: 88 year old female with anemia and monoclonal gammopathy of undetermined significance. She presents for bone marrow biopsy. EXAM: CT GUIDED BONE MARROW ASPIRATION AND CORE BIOPSY Interventional Radiologist:  Criselda Peaches, MD MEDICATIONS: None. ANESTHESIA/SEDATION: Moderate (conscious) sedation was employed during this procedure. A total of 1.5 milligrams versed and 50 micrograms fentanyl were administered intravenously. The patient's level of consciousness and vital signs were monitored continuously by radiology nursing throughout the procedure under my direct supervision. Total monitored sedation time: 10 minutes FLUOROSCOPY: None. COMPLICATIONS: None immediate. Estimated blood loss: <25 mL PROCEDURE: Informed written consent was obtained from the patient after a thorough discussion of the procedural risks, benefits and alternatives. All questions were addressed. Maximal Sterile Barrier Technique was utilized including caps, mask, sterile gowns, sterile gloves, sterile drape, hand hygiene and skin antiseptic. A timeout was performed prior to the initiation of the procedure. The patient was positioned prone and non-contrast localization CT was performed of the pelvis to demonstrate the iliac marrow spaces.  Maximal barrier sterile technique utilized including caps, mask, sterile gowns, sterile gloves, large sterile drape, hand hygiene, and betadine prep. Under sterile conditions and local anesthesia, an 11 gauge coaxial bone biopsy needle was advanced into the right iliac marrow space. Needle position was confirmed with CT imaging. Initially, bone marrow aspiration was performed. Next, the 11 gauge outer cannula was utilized to obtain a right iliac bone marrow core biopsy. Needle was removed. Hemostasis was obtained with compression. The patient tolerated the procedure well. Samples were prepared with the cytotechnologist. IMPRESSION: Technically successful CT-guided bone marrow biopsy and aspiration. Electronically Signed   By: Jacqulynn Cadet M.D.   On: 07/13/2022 14:02    ASSESSMENT & PLAN [name] 88 y.o. female with medical history significant for newly diagnosed multiple myeloma who presents for a follow up visit.   After review of the labs, review the records, discussion with the patient the findings are most consistent with an IgG lambda multiple myeloma.  The patient meets the diagnostic criteria based on 60% plasma cells in the bone marrow and anemia.  Additionally the patient was noted to have vitamin B12 deficiency which is currently  being treated as well.  Given her degree of anemia would recommend proceeding with VD chemotherapy and will add Revlimid once hemoglobin improves.  The patient and her daughter voiced understanding of the plan moving forward.  # IgG Lambda Multiple Myeloma  --diagnosis of MM confirmed with bone marrow biopsy showing 60% plasma cells and anemia -- Bone survey shows no lytic lesions, kidney function is within normal limits.  Plan:  --plan to proceed with Vd chemotherapy. Will add Revlimid when Hgb improves --at each visit will collect CBC, CMP, and LDH with monthly restaging labs SPEP and SFLC.  --RTC in 1 to 2 weeks to start treatment.   #Vitamin B12  Deficiency --initial labs showed elevated MMA with B12 180 -- Continue vitamin B12 1000 mcg p.o. daily -- Continue to monitor  #Supportive Care -- chemotherapy education to be scheduled  -- port placement not required.  -- zofran 73m q8H PRN and compazine 171mPO q6H for nausea -- acyclovir 40073mO BID for VCZ prophylaxis -- no pain medication required at this time.   Orders Placed This Encounter  Procedures   Multiple Myeloma Panel (SPEP&IFE w/QIG)    Standing Status:   Future    Standing Expiration Date:   07/31/2023   Kappa/lambda light chains    Standing Status:   Future    Standing Expiration Date:   07/31/2023   CBC with Differential ([facility])    Standing Status:   Future    Standing Expiration Date:   08/01/2023   CMP ([facility])    Standing Status:   Future    Standing Expiration Date:   08/01/2023   CBC with Differential ([facility])    Standing Status:   Future    Standing Expiration Date:   08/08/2023   CMP ([facility])    Standing Status:   Future    Standing Expiration Date:   08/08/2023   CBC with Differential ([facility])    Standing Status:   Future    Standing Expiration Date:   08/15/2023   CMP ([facility])    Standing Status:   Future    Standing Expiration Date:   08/15/2023   CBC with Differential ([facility])    Standing Status:   Future    Standing Expiration Date:   08/22/2023   CMP ([facility])    Standing Status:   Future    Standing Expiration Date:   08/22/2023   Multiple Myeloma Panel (SPEP&IFE w/QIG)    Standing Status:   Future    Standing Expiration Date:   08/28/2023   Kappa/lambda light chains    Standing Status:   Future    Standing Expiration Date:   08/28/2023   CBC with Differential ([facility])    Standing Status:   Future    Standing Expiration Date:   08/29/2023   CMP ([facility])    Standing Status:   Future    Standing Expiration Date:    08/29/2023   CBC with Differential ([facility])    Standing Status:   Future    Standing Expiration Date:   09/05/2023   CMP ([facility])    Standing Status:   Future    Standing Expiration Date:   09/05/2023    All questions were answered. The patient knows to call the clinic with any problems, questions or concerns.  A total of more than 30 minutes were spent  on this encounter with face-to-face time and non-face-to-face time, including preparing to see the patient, ordering tests and/or medications, counseling the patient and coordination of care as outlined above.   Ledell Peoples, MD Department of Hematology/Oncology [place] at [facility] Phone: [phone] Pager: [phone] Email: [email or web address]  07/24/2022 3:10 PM

## 2022-07-25 ENCOUNTER — Other Ambulatory Visit: Payer: Self-pay

## 2022-07-25 NOTE — Progress Notes (Signed)
Pharmacist Chemotherapy Monitoring - Initial Assessment    Anticipated start date: 08/01/22   The following has been reviewed per standard work regarding the patient's treatment regimen: The patient's diagnosis, treatment plan and drug doses, and organ/hematologic function Lab orders and baseline tests specific to treatment regimen  The treatment plan start date, drug sequencing, and pre-medications Prior authorization status  Patient's documented medication list, including drug-drug interaction screen and prescriptions for anti-emetics and supportive care specific to the treatment regimen The drug concentrations, fluid compatibility, administration routes, and timing of the medications to be used The patient's access for treatment and lifetime cumulative dose history, if applicable  The patient's medication allergies and previous infusion related reactions, if applicable   Changes made to treatment plan:  N/A  Follow up needed:  Pending authorization for treatment    Larene Beach, RPH, 07/25/2022  9:35 AM

## 2022-07-26 ENCOUNTER — Other Ambulatory Visit: Payer: Self-pay

## 2022-07-31 ENCOUNTER — Telehealth: Payer: Self-pay

## 2022-07-31 ENCOUNTER — Inpatient Hospital Stay: Payer: Medicare Other

## 2022-07-31 ENCOUNTER — Other Ambulatory Visit: Payer: Self-pay

## 2022-07-31 NOTE — Telephone Encounter (Signed)
Yes she needs to. Will deal with CHF if she were to develop this. She should avoid salty food and watch her calories

## 2022-07-31 NOTE — Telephone Encounter (Signed)
Patient called and states that she has recently been diagnosed with Multiple Myeloma Blood Cancer. She is being treated by Dr. Lorenso Courier. Patient starts treatment tomorrow and will be taking Dexamethasone 20 mg. She is worried due to her previous Diagnosis of CHF that the medication for treatment could cause fluid retention as she states that she has researched. She wanted to know if she should be taking this medication or not. Patient states that her doctor was supposed to have reached out to you to discuss this medication.  Please Advise

## 2022-07-31 NOTE — Telephone Encounter (Signed)
Spoke with patient and informed her of the above information. Patient voiced a verbal understanding

## 2022-08-01 ENCOUNTER — Inpatient Hospital Stay: Payer: Medicare Other

## 2022-08-01 ENCOUNTER — Inpatient Hospital Stay (HOSPITAL_BASED_OUTPATIENT_CLINIC_OR_DEPARTMENT_OTHER): Payer: Medicare Other | Admitting: Hematology and Oncology

## 2022-08-01 VITALS — BP 123/73 | HR 78 | Temp 98.3°F | Resp 17

## 2022-08-01 DIAGNOSIS — C9000 Multiple myeloma not having achieved remission: Secondary | ICD-10-CM

## 2022-08-01 DIAGNOSIS — D649 Anemia, unspecified: Secondary | ICD-10-CM | POA: Diagnosis not present

## 2022-08-01 DIAGNOSIS — Z5112 Encounter for antineoplastic immunotherapy: Secondary | ICD-10-CM | POA: Diagnosis not present

## 2022-08-01 DIAGNOSIS — I110 Hypertensive heart disease with heart failure: Secondary | ICD-10-CM | POA: Diagnosis not present

## 2022-08-01 DIAGNOSIS — D72829 Elevated white blood cell count, unspecified: Secondary | ICD-10-CM | POA: Diagnosis not present

## 2022-08-01 DIAGNOSIS — I5033 Acute on chronic diastolic (congestive) heart failure: Secondary | ICD-10-CM | POA: Diagnosis not present

## 2022-08-01 DIAGNOSIS — E538 Deficiency of other specified B group vitamins: Secondary | ICD-10-CM | POA: Diagnosis not present

## 2022-08-01 DIAGNOSIS — E871 Hypo-osmolality and hyponatremia: Secondary | ICD-10-CM | POA: Diagnosis not present

## 2022-08-01 DIAGNOSIS — M546 Pain in thoracic spine: Secondary | ICD-10-CM | POA: Diagnosis not present

## 2022-08-01 DIAGNOSIS — R34 Anuria and oliguria: Secondary | ICD-10-CM | POA: Diagnosis not present

## 2022-08-01 LAB — CBC WITH DIFFERENTIAL (CANCER CENTER ONLY)
Abs Immature Granulocytes: 0.06 10*3/uL (ref 0.00–0.07)
Basophils Absolute: 0.1 10*3/uL (ref 0.0–0.1)
Basophils Relative: 1 %
Eosinophils Absolute: 0 10*3/uL (ref 0.0–0.5)
Eosinophils Relative: 1 %
HCT: 27.5 % — ABNORMAL LOW (ref 36.0–46.0)
Hemoglobin: 8.9 g/dL — ABNORMAL LOW (ref 12.0–15.0)
Immature Granulocytes: 1 %
Lymphocytes Relative: 13 %
Lymphs Abs: 0.9 10*3/uL (ref 0.7–4.0)
MCH: 27.1 pg (ref 26.0–34.0)
MCHC: 32.4 g/dL (ref 30.0–36.0)
MCV: 83.8 fL (ref 80.0–100.0)
Monocytes Absolute: 0.4 10*3/uL (ref 0.1–1.0)
Monocytes Relative: 5 %
Neutro Abs: 5.6 10*3/uL (ref 1.7–7.7)
Neutrophils Relative %: 79 %
Platelet Count: 182 10*3/uL (ref 150–400)
RBC: 3.28 MIL/uL — ABNORMAL LOW (ref 3.87–5.11)
RDW: 20.8 % — ABNORMAL HIGH (ref 11.5–15.5)
WBC Count: 7 10*3/uL (ref 4.0–10.5)
nRBC: 0 % (ref 0.0–0.2)

## 2022-08-01 LAB — CMP (CANCER CENTER ONLY)
ALT: 9 U/L (ref 0–44)
AST: 17 U/L (ref 15–41)
Albumin: 2.6 g/dL — ABNORMAL LOW (ref 3.5–5.0)
Alkaline Phosphatase: 47 U/L (ref 38–126)
Anion gap: 7 (ref 5–15)
BUN: 26 mg/dL — ABNORMAL HIGH (ref 8–23)
CO2: 26 mmol/L (ref 22–32)
Calcium: 8.2 mg/dL — ABNORMAL LOW (ref 8.9–10.3)
Chloride: 96 mmol/L — ABNORMAL LOW (ref 98–111)
Creatinine: 0.91 mg/dL (ref 0.44–1.00)
GFR, Estimated: >60 mL/min (ref >60)
Glucose, Bld: 125 mg/dL — ABNORMAL HIGH (ref 70–99)
Potassium: 4.2 mmol/L (ref 3.5–5.1)
Sodium: 129 mmol/L — ABNORMAL LOW (ref 135–145)
Total Bilirubin: 0.3 mg/dL (ref 0.3–1.2)
Total Protein: 10.4 g/dL — ABNORMAL HIGH (ref 6.5–8.1)

## 2022-08-01 MED ORDER — BORTEZOMIB CHEMO SQ INJECTION 3.5 MG (2.5MG/ML)
1.3000 mg/m2 | Freq: Once | INTRAMUSCULAR | Status: AC
  Administered 2022-08-01: 2.25 mg via SUBCUTANEOUS
  Filled 2022-08-01: qty 0.9

## 2022-08-01 NOTE — Progress Notes (Signed)
[name] [phone]   [phone]  PROGRESS NOTE  Patient Care Team: [facility] as PCP - General (Rheumatology)  Hematological/Oncological History # IgG Lambda Multiple Myeloma  06/14/2022: establish care with [name] due to anemia. Labs showed M protein 4.9, Kappa 7.2, Lambda 10.8, ratio 0.67 07/13/2022: Bmbx showed Lambda restricted plasma cell neoplasm involving approximately 60% of the cellular marrow by IHC on the biopsy.  08/01/2022: Cycle 1 Day 1 of VRd chemotherapy.   Interval History:  Zoe [name] 88 y.o. female with medical history significant for newly diagnosed multiple myeloma who presents for a follow up visit. The patient's last visit was on 07/24/2022. In the interim since the last visit she completed chemotherapy education.  On exam today Zoe [name] reports she has been well in the interim since her last visit.  She attended chemotherapy education and found informative.  She reports that she took her acyclovir and first dose of steroids this morning.  She is all medications necessary to start treatment.  She notes that she does not have any questions concerns or complaints today.  She is willing and able to proceed with treatment at this time.  She denies any fevers, chills, sweats, nausea, vomiting, diarrhea.  Full 10 point ROS was otherwise negative.  The bulk of our discussion focused on assuring she had everything necessary to start treatment.  All of her questions and concerns were addressed.  MEDICAL HISTORY:  Past Medical History:  Diagnosis Date   Arthritis    some - per patient   Breast cancer (HCC)    breast cancer / left    Cataract    bilat    GERD (gastroesophageal reflux disease)    History of kidney stones    Hyperlipidemia    Hypertension    Hypothyroidism    Macular degeneration    Left   S/P TAVR (transcatheter aortic valve replacement) 09/03/2018   23 mm Edwards Sapien 3  transcatheter heart valve placed via percutaneous right transfemoral approach    Severe aortic stenosis    Stress incontinence    Thyroid disease    Tinnitus     SURGICAL HISTORY: Past Surgical History:  Procedure Laterality Date   ABDOMINAL HYSTERECTOMY  1970's   BACK SURGERY     BREAST LUMPECTOMY  12/1998   lumpectomy   CARDIAC CATHETERIZATION     EYE SURGERY     cataract surgery bilat    INTRAOPERATIVE TRANSTHORACIC ECHOCARDIOGRAM N/A 09/03/2018   Procedure: INTRAOPERATIVE TRANSTHORACIC ECHOCARDIOGRAM;  Surgeon: McAlhany, Christopher D, MD;  Location: [facility] OR;  Service: Open Heart Surgery;  Laterality: N/A;   LITHOTRIPSY     Right total knee     2018 Dr. Olin   RIGHT/LEFT HEART CATH AND CORONARY ANGIOGRAPHY N/A 08/06/2018   Procedure: RIGHT/LEFT HEART CATH AND CORONARY ANGIOGRAPHY;  Surgeon: Ganji, Jay, MD;  Location: [facility] INVASIVE CV LAB;  Service: Cardiovascular;  Laterality: N/A;   THYROIDECTOMY, PARTIAL  1975   TONSILLECTOMY     as a child - patient not sure of exact date   TOTAL KNEE ARTHROPLASTY Left 03/13/2016   Procedure: TOTAL KNEE ARTHROPLASTY;  Surgeon: Matthew Olin, MD;  Location: [facility] ORS;  Service: Orthopedics;  Laterality: Left;   TOTAL KNEE ARTHROPLASTY Right 06/18/2017   Procedure: RIGHT TOTAL KNEE ARTHROPLASTY;  Surgeon: Olin, Matthew, MD;  Location: [facility] ORS;  Service: Orthopedics;  Laterality: Right;   TRANSCATHETER AORTIC VALVE REPLACEMENT, TRANSFEMORAL N/A 09/03/2018   Procedure: TRANSCATHETER AORTIC VALVE   REPLACEMENT, TRANSFEMORAL;  Surgeon: Burnell Blanks, MD;  Location: [facility];  Service: Open Heart Surgery;  Laterality: N/A;    SOCIAL HISTORY: Social History   Socioeconomic History   Marital status: Widowed    Spouse name: Not on file   Number of children: 4   Years of education: Not on file   Highest education level: Master's degree (e.g., MA, MS, MEng, MEd, MSW, MBA)  Occupational History   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  Tobacco Use   Smoking status: Never   Smokeless tobacco: Never  Vaping Use   Vaping Use: Never used  Substance and Sexual Activity   Alcohol use: No   Drug use: No   Sexual activity: Not Currently  Other Topics Concern   Not on file  Social History Narrative   Not on file   Social Determinants of Health   Financial Resource Strain: Low Risk  (11/12/2018)   Overall Financial Resource Strain (CARDIA)    Difficulty of Paying Living Expenses: Not very hard  Food Insecurity: No Food Insecurity (11/12/2018)   Hunger Vital Sign    Worried About Running Out of Food in the Last Year: Never true    Ran Out of Food in the Last Year: Never true  Transportation Needs: No Transportation Needs (11/12/2018)   PRAPARE - Hydrologist (Medical): No    Lack of Transportation (Non-Medical): No  Physical Activity: Inactive (11/12/2018)   Exercise Vital Sign    Days of Exercise per Week: 0 days    Minutes of Exercise per Session: 0 min  Stress: Stress Concern Present (11/12/2018)   [place]    Feeling of Stress : To some extent  Social Connections: Not on file  Intimate Partner Violence: Not on file    FAMILY HISTORY: Family History  Problem Relation Age of Onset   Diabetes Mother    Stroke Mother        Carotid artery disease   Heart disease Father        CAD   Coronary artery disease Father    Diabetes Sister     ALLERGIES:  is allergic to penicillins and sulfa antibiotics.  MEDICATIONS:  Current Outpatient Medications  Medication Sig Dispense Refill   acyclovir (ZOVIRAX) 400 MG tablet Take 1 tablet (400 mg total) by mouth 2 (two) times daily. 180 tablet 3   Artificial Tear Solution (SOOTHE XP) SOLN Place 1 drop into both eyes every evening.     aspirin 81 MG chewable tablet Chew 1 tablet (81 mg total) by mouth daily. (Patient taking differently: Chew 81 mg by mouth every  evening.)     carvedilol (COREG) 12.5 MG tablet Take 12.5 mg by mouth 2 (two) times daily with a meal.     Cholecalciferol (VITAMIN D3) 2000 units TABS Take 2,000 Units by mouth daily.     cyanocobalamin (VITAMIN B12) 1000 MCG tablet Take 1 tablet (1,000 mcg total) by mouth daily. 30 tablet 3   dapagliflozin propanediol (FARXIGA) 10 MG TABS tablet Take 1 tablet (10 mg total) by mouth daily. 30 tablet 2   dexamethasone (DECADRON) 4 MG tablet Take 5 tablets (20 mg total) by mouth once a week. Take 5 tablets in the morning on days when receiving the chemotherapy shot. 20 tablet 5   esomeprazole (NEXIUM) 20 MG capsule Take 20 mg by mouth daily.     ferrous sulfate 325 (65  FE) MG tablet Take 325 mg by mouth daily with breakfast.     furosemide (LASIX) 20 MG tablet PLEASE SEE ATTACHED FOR DETAILED DIRECTIONS 90 tablet 1   levothyroxine (SYNTHROID, LEVOTHROID) 100 MCG tablet Take 100 mcg by mouth daily before breakfast.  2   losartan (COZAAR) 25 MG tablet Take 1 tablet (25 mg total) by mouth daily. 90 tablet 1   Multiple Vitamins-Minerals (PRESERVISION AREDS 2) CAPS Take 1 capsule by mouth 2 (two) times daily.     ondansetron (ZOFRAN) 8 MG tablet Take 1 tablet (8 mg total) by mouth every 8 (eight) hours as needed. 30 tablet 0   potassium chloride (KLOR-CON M) 10 MEQ tablet Take 2 tablets (20 mEq total) by mouth daily as needed. Take with furosemide 180 tablet 0   pravastatin (PRAVACHOL) 40 MG tablet Take 40 mg by mouth every evening.     prochlorperazine (COMPAZINE) 10 MG tablet Take 1 tablet (10 mg total) by mouth every 6 (six) hours as needed for nausea or vomiting. 30 tablet 0   No current facility-administered medications for this visit.    REVIEW OF SYSTEMS:   Constitutional: ( - ) fevers, ( - )  chills , ( - ) night sweats Eyes: ( - ) blurriness of vision, ( - ) double vision, ( - ) watery eyes Ears, nose, mouth, throat, and face: ( - ) mucositis, ( - ) sore throat Respiratory: ( - ) cough, (  - ) dyspnea, ( - ) wheezes Cardiovascular: ( - ) palpitation, ( - ) chest discomfort, ( - ) lower extremity swelling Gastrointestinal:  ( - ) nausea, ( - ) heartburn, ( - ) change in bowel habits Skin: ( - ) abnormal skin rashes Lymphatics: ( - ) new lymphadenopathy, ( - ) easy bruising Neurological: ( - ) numbness, ( - ) tingling, ( - ) new weaknesses Behavioral/Psych: ( - ) mood change, ( - ) new changes  All other systems were reviewed with the patient and are negative.  PHYSICAL EXAMINATION: ECOG PERFORMANCE STATUS: 1 - Symptomatic but completely ambulatory  Vitals:   08/01/22 1125  BP: (!) 166/76  Pulse: 72  Resp: 15  Temp: 97.8 F (36.6 C)  SpO2: 99%   Filed Weights   08/01/22 1125  Weight: 162 lb 3.2 oz (73.6 kg)    GENERAL: well appearing elderly Caucasian female, alert, no distress and comfortable SKIN: skin color, texture, turgor are normal, no rashes or significant lesions EYES: conjunctiva are pink and non-injected, sclera clear LUNGS: clear to auscultation and percussion with normal breathing effort HEART: regular rate & rhythm and no murmurs and no lower extremity edema Musculoskeletal: no cyanosis of digits and no clubbing  PSYCH: alert & oriented x 3, fluent speech NEURO: no focal motor/sensory deficits  LABORATORY DATA:  I have reviewed the data as listed    Latest Ref Rng & Units 08/01/2022    9:50 AM 07/13/2022   10:25 AM 06/14/2022   12:17 PM  CBC  WBC 4.0 - 10.5 K/uL 7.0  6.7  6.5   Hemoglobin 12.0 - 15.0 g/dL 8.9  8.5  9.3   Hematocrit 36.0 - 46.0 % 27.5  27.6  28.2   Platelets 150 - 400 K/uL 182  184  183        Latest Ref Rng & Units 08/01/2022    9:50 AM 06/19/2022   10:12 AM 06/14/2022   12:17 PM  CMP  Glucose 70 - 99 mg/dL 125  98  101   BUN 8 - 23 mg/dL 26  21  20   Creatinine 0.44 - 1.00 mg/dL 0.91  0.94  0.82   Sodium 135 - 145 mmol/L 129  130  129   Potassium 3.5 - 5.1 mmol/L 4.2  4.3  4.1   Chloride 98 - 111 mmol/L 96  95  96    CO2 22 - 32 mmol/L 26  23  27   Calcium 8.9 - 10.3 mg/dL 8.2  8.7  8.5   Total Protein 6.5 - 8.1 g/dL 10.4   10.3   Total Bilirubin 0.3 - 1.2 mg/dL 0.3   0.5   Alkaline Phos 38 - 126 U/L 47   53   AST 15 - 41 U/L 17   21   ALT 0 - 44 U/L 9   11     Lab Results  Component Value Date   MPROTEIN 4.9 (H) 06/14/2022   Lab Results  Component Value Date   KPAFRELGTCHN 7.2 06/14/2022   LAMBDASER 10.8 06/14/2022   KAPLAMBRATIO 7.11 07/01/2022   KAPLAMBRATIO 0.67 06/14/2022    RADIOGRAPHIC STUDIES: CT BONE MARROW BIOPSY & ASPIRATION  Result Date: 07/13/2022 INDICATION: 88-year-old female with anemia and monoclonal gammopathy of undetermined significance. She presents for bone marrow biopsy. EXAM: CT GUIDED BONE MARROW ASPIRATION AND CORE BIOPSY Interventional Radiologist:  Heath K. McCullough, MD MEDICATIONS: None. ANESTHESIA/SEDATION: Moderate (conscious) sedation was employed during this procedure. A total of 1.5 milligrams versed and 50 micrograms fentanyl were administered intravenously. The patient's level of consciousness and vital signs were monitored continuously by radiology nursing throughout the procedure under my direct supervision. Total monitored sedation time: 10 minutes FLUOROSCOPY: None. COMPLICATIONS: None immediate. Estimated blood loss: <25 mL PROCEDURE: Informed written consent was obtained from the patient after a thorough discussion of the procedural risks, benefits and alternatives. All questions were addressed. Maximal Sterile Barrier Technique was utilized including caps, mask, sterile gowns, sterile gloves, sterile drape, hand hygiene and skin antiseptic. A timeout was performed prior to the initiation of the procedure. The patient was positioned prone and non-contrast localization CT was performed of the pelvis to demonstrate the iliac marrow spaces. Maximal barrier sterile technique utilized including caps, mask, sterile gowns, sterile gloves, large sterile drape, hand  hygiene, and betadine prep. Under sterile conditions and local anesthesia, an 11 gauge coaxial bone biopsy needle was advanced into the right iliac marrow space. Needle position was confirmed with CT imaging. Initially, bone marrow aspiration was performed. Next, the 11 gauge outer cannula was utilized to obtain a right iliac bone marrow core biopsy. Needle was removed. Hemostasis was obtained with compression. The patient tolerated the procedure well. Samples were prepared with the cytotechnologist. IMPRESSION: Technically successful CT-guided bone marrow biopsy and aspiration. Electronically Signed   By: Heath  McCullough M.D.   On: 07/13/2022 14:02    ASSESSMENT & PLAN Sary B Mancias 88 y.o. female with medical history significant for newly diagnosed multiple myeloma who presents for a follow up visit.   After review of the labs, review the records, discussion with the patient the findings are most consistent with an IgG lambda multiple myeloma.  The patient meets the diagnostic criteria based on 60% plasma cells in the bone marrow and anemia.  Additionally the patient was noted to have vitamin B12 deficiency which is currently being treated as well.  Given her degree of anemia would recommend proceeding with VD chemotherapy and will add Revlimid once hemoglobin improves.  The patient   and her daughter voiced understanding of the plan moving forward.  # IgG Lambda Multiple Myeloma  --diagnosis of MM confirmed with bone marrow biopsy showing 60% plasma cells and anemia -- Bone survey shows no lytic lesions, kidney function is within normal limits.  --08/01/2022 was Cycle 1 Day 1 of Vd  Plan:  --Today is Cycle 1 Day 1 of Vd chemotherapy. Will add Revlimid when Hgb improves --Labs show white blood cell count 7.0, hemoglobin 8.9, MCV 83.8, and platelets of 182 --at each visit will collect CBC, CMP, and LDH with monthly restaging labs SPEP and SFLC.  --RTC in 2 weeks with interval weekly treatment.    # Normocytic Anemia #Vitamin B12 Deficiency --Anemia likely driven by multiple myeloma, but may be component of vitamin B12 deficiency as well. --initial labs showed elevated MMA with B12 180 -- Continue vitamin B12 1000 mcg p.o. daily -- Continue to monitor  #Supportive Care -- chemotherapy education complete -- port placement not required.  -- zofran 15m q8H PRN and compazine 130mPO q6H for nausea -- acyclovir 40059mO BID for VCZ prophylaxis -- no pain medication required at this time.   No orders of the defined types were placed in this encounter.   All questions were answered. The patient knows to call the clinic with any problems, questions or concerns.  A total of more than 25 minutes were spent on this encounter with face-to-face time and non-face-to-face time, including preparing to see the patient, ordering tests and/or medications, counseling the patient and coordination of care as outlined above.   JohLedell PeoplesD Department of Hematology/Oncology [facility] Hospitalone: [phone]: [phone]: [email or web address]  08/01/2022 2:09 PM

## 2022-08-01 NOTE — Patient Instructions (Signed)
[facility]  Discharge Instructions: Thank you for choosing [facility] to provide your oncology and hematology care.   If you have a lab appointment with the [facility], please go directly to the [facility] and check in at the registration area.   Wear comfortable clothing and clothing appropriate for easy access to any Portacath or PICC line.   We strive to give you quality time with your provider. You may need to reschedule your appointment if you arrive late (15 or more minutes).  Arriving late affects you and other patients whose appointments are after yours.  Also, if you miss three or more appointments without notifying the office, you may be dismissed from the clinic at the provider's discretion.      For prescription refill requests, have your pharmacy contact our office and allow 72 hours for refills to be completed.    Today you received the following chemotherapy and/or immunotherapy agents: bortezomib (Velcade)      To help prevent nausea and vomiting after your treatment, we encourage you to take your nausea medication as directed.  BELOW ARE SYMPTOMS THAT SHOULD BE REPORTED IMMEDIATELY: *FEVER GREATER THAN 100.4 F (38 C) OR HIGHER *CHILLS OR SWEATING *NAUSEA AND VOMITING THAT IS NOT CONTROLLED WITH YOUR NAUSEA MEDICATION *UNUSUAL SHORTNESS OF BREATH *UNUSUAL BRUISING OR BLEEDING *URINARY PROBLEMS (pain or burning when urinating, or frequent urination) *BOWEL PROBLEMS (unusual diarrhea, constipation, pain near the anus) TENDERNESS IN MOUTH AND THROAT WITH OR WITHOUT PRESENCE OF ULCERS (sore throat, sores in mouth, or a toothache) UNUSUAL RASH, SWELLING OR PAIN  UNUSUAL VAGINAL DISCHARGE OR ITCHING   Items with * indicate a potential emergency and should be followed up as soon as possible or go to the Emergency Department if any problems should occur.  Please show the CHEMOTHERAPY ALERT CARD or IMMUNOTHERAPY ALERT CARD at  check-in to the Emergency Department and triage nurse.  Should you have questions after your visit or need to cancel or reschedule your appointment, please contact [facility]  Dept: [phone]  and follow the prompts.  Office hours are 8:00 a.m. to 4:30 p.m. Monday - Friday. Please note that voicemails left after 4:00 p.m. may not be returned until the following business day.  We are closed weekends and major holidays. You have access to a nurse at all times for urgent questions. Please call the main number to the clinic Dept: [phone] and follow the prompts.   For any non-urgent questions, you may also contact your provider using MyChart. We now offer e-Visits for anyone 86 and older to request care online for non-urgent symptoms. For details visit [email or web address].   Also download the MyChart app! Go to the app store, search "MyChart", open the app, select [facility], and log in with your MyChart username and password.  Masks are optional in the cancer centers. If you would like for your care team to wear a mask while they are taking care of you, please let them know. You may have one support person who is at least 86 years old accompany you for your appointments.  Bortezomib Injection What is this medication? BORTEZOMIB (bor TEZ oh mib) treats lymphoma. It may also be used to treat multiple myeloma, a type of bone marrow cancer. It works by blocking a protein that causes cancer cells to grow and multiply. This helps to slow or stop the spread of cancer cells. This medicine may be used for other  purposes; ask your health care provider or pharmacist if you have questions. COMMON BRAND NAME(S): Velcade What should I tell my care team before I take this medication? They need to know if you have any of these conditions: Dehydration Diabetes Heart disease Liver disease Tingling of the fingers or toes or other nerve disorder An unusual or allergic  reaction to bortezomib, other medications, foods, dyes, or preservatives If you or your partner are pregnant or trying to get pregnant Breastfeeding How should I use this medication? This medication is injected into a vein or under the skin. It is given by your care team in a hospital or clinic setting. Talk to your care team about the use of this medication in children. Special care may be needed. Overdosage: If you think you have taken too much of this medicine contact a poison control center or emergency room at once. NOTE: This medicine is only for you. Do not share this medicine with others. What if I miss a dose? Keep appointments for follow-up doses. It is important not to miss your dose. Call your care team if you are unable to keep an appointment. What may interact with this medication? Ketoconazole Rifampin This list may not describe all possible interactions. Give your health care provider a list of all the medicines, herbs, non-prescription drugs, or dietary supplements you use. Also tell them if you smoke, drink alcohol, or use illegal drugs. Some items may interact with your medicine. What should I watch for while using this medication? Your condition will be monitored carefully while you are receiving this medication. You may need blood work while taking this medication. This medication may affect your coordination, reaction time, or judgment. Do not drive or operate machinery until you know how this medication affects you. Sit up or stand slowly to reduce the risk of dizzy or fainting spells. Drinking alcohol with this medication can increase the risk of these side effects. This medication may increase your risk of getting an infection. Call your care team for advice if you get a fever, chills, sore throat, or other symptoms of a cold or flu. Do not treat yourself. Try to avoid being around people who are sick. Check with your care team if you have severe diarrhea, nausea, and  vomiting, or if you sweat a lot. The loss of too much body fluid may make it dangerous for you to take this medication. Talk to your care team if you may be pregnant. Serious birth defects can occur if you take this medication during pregnancy and for 7 months after the last dose. You will need a negative pregnancy test before starting this medication. Contraception is recommended while taking this medication and for 7 months after the last dose. Your care team can help you find the option that works for you. If your partner can get pregnant, use a condom during sex while taking this medication and for 4 months after the last dose. Do not breastfeed while taking this medication and for 2 months after the last dose. This medication may cause infertility. Talk to your care team if you are concerned about your fertility. What side effects may I notice from receiving this medication? Side effects that you should report to your care team as soon as possible: Allergic reactions--skin rash, itching, hives, swelling of the face, lips, tongue, or throat Bleeding--bloody or black, tar-like stools, vomiting blood or brown material that looks like coffee grounds, red or dark brown urine, small red or purple spots  on skin, unusual bruising or bleeding Bleeding in the brain--severe headache, stiff neck, confusion, dizziness, change in vision, numbness or weakness of the face, arm, or leg, trouble speaking, trouble walking, vomiting Bowel blockage--stomach cramping, unable to have a bowel movement or pass gas, loss of appetite, vomiting Heart failure--shortness of breath, swelling of the ankles, feet, or hands, sudden weight gain, unusual weakness or fatigue Infection--fever, chills, cough, sore throat, wounds that don't heal, pain or trouble when passing urine, general feeling of discomfort or being unwell Liver injury--right upper belly pain, loss of appetite, nausea, light-colored stool, dark yellow or brown urine,  yellowing skin or eyes, unusual weakness or fatigue Low blood pressure--dizziness, feeling faint or lightheaded, blurry vision Lung injury--shortness of breath or trouble breathing, cough, spitting up blood, chest pain, fever Pain, tingling, or numbness in the hands or feet Severe or prolonged diarrhea Stomach pain, bloody diarrhea, pale skin, unusual weakness or fatigue, decrease in the amount of urine, which may be signs of hemolytic uremic syndrome Sudden and severe headache, confusion, change in vision, seizures, which may be signs of posterior reversible encephalopathy syndrome (PRES) TTP--purple spots on the skin or inside the mouth, pale skin, yellowing skin or eyes, unusual weakness or fatigue, fever, fast or irregular heartbeat, confusion, change in vision, trouble speaking, trouble walking Tumor lysis syndrome (TLS)--nausea, vomiting, diarrhea, decrease in the amount of urine, dark urine, unusual weakness or fatigue, confusion, muscle pain or cramps, fast or irregular heartbeat, joint pain Side effects that usually do not require medical attention (report to your care team if they continue or are bothersome): Constipation Diarrhea Fatigue Loss of appetite Nausea This list may not describe all possible side effects. Call your doctor for medical advice about side effects. You may report side effects to FDA at 1-800-FDA-1088. Where should I keep my medication? This medication is given in a hospital or clinic. It will not be stored at home. NOTE: This sheet is a summary. It may not cover all possible information. If you have questions about this medicine, talk to your doctor, pharmacist, or health care provider.  2023 Elsevier/Gold Standard (2022-03-08 00:00:00)

## 2022-08-01 NOTE — Progress Notes (Signed)
Patient took steroid at home this morning prior to coming to the clinic.

## 2022-08-02 ENCOUNTER — Ambulatory Visit: Payer: Medicare Other | Admitting: Cardiology

## 2022-08-02 ENCOUNTER — Other Ambulatory Visit: Payer: Self-pay | Admitting: *Deleted

## 2022-08-02 DIAGNOSIS — C9000 Multiple myeloma not having achieved remission: Secondary | ICD-10-CM

## 2022-08-02 LAB — KAPPA/LAMBDA LIGHT CHAINS
Kappa free light chain: 6.3 mg/L (ref 3.3–19.4)
Kappa, lambda light chain ratio: 0.65 (ref 0.26–1.65)
Lambda free light chains: 9.7 mg/L (ref 5.7–26.3)

## 2022-08-07 ENCOUNTER — Inpatient Hospital Stay: Payer: Medicare Other

## 2022-08-07 ENCOUNTER — Other Ambulatory Visit: Payer: Self-pay

## 2022-08-07 VITALS — BP 128/72 | HR 72 | Temp 98.2°F | Resp 18 | Wt 161.5 lb

## 2022-08-07 DIAGNOSIS — E538 Deficiency of other specified B group vitamins: Secondary | ICD-10-CM | POA: Diagnosis not present

## 2022-08-07 DIAGNOSIS — C9000 Multiple myeloma not having achieved remission: Secondary | ICD-10-CM

## 2022-08-07 DIAGNOSIS — I5033 Acute on chronic diastolic (congestive) heart failure: Secondary | ICD-10-CM | POA: Diagnosis not present

## 2022-08-07 DIAGNOSIS — R34 Anuria and oliguria: Secondary | ICD-10-CM | POA: Diagnosis not present

## 2022-08-07 DIAGNOSIS — E871 Hypo-osmolality and hyponatremia: Secondary | ICD-10-CM | POA: Diagnosis not present

## 2022-08-07 DIAGNOSIS — I110 Hypertensive heart disease with heart failure: Secondary | ICD-10-CM | POA: Diagnosis not present

## 2022-08-07 DIAGNOSIS — M546 Pain in thoracic spine: Secondary | ICD-10-CM | POA: Diagnosis not present

## 2022-08-07 DIAGNOSIS — D72829 Elevated white blood cell count, unspecified: Secondary | ICD-10-CM | POA: Diagnosis not present

## 2022-08-07 DIAGNOSIS — D649 Anemia, unspecified: Secondary | ICD-10-CM | POA: Diagnosis not present

## 2022-08-07 DIAGNOSIS — Z5112 Encounter for antineoplastic immunotherapy: Secondary | ICD-10-CM | POA: Diagnosis not present

## 2022-08-07 LAB — MULTIPLE MYELOMA PANEL, SERUM
Albumin SerPl Elph-Mcnc: 3.4 g/dL (ref 2.9–4.4)
Albumin/Glob SerPl: 0.6 — ABNORMAL LOW (ref 0.7–1.7)
Alpha 1: 0.3 g/dL (ref 0.0–0.4)
Alpha2 Glob SerPl Elph-Mcnc: 0.6 g/dL (ref 0.4–1.0)
B-Globulin SerPl Elph-Mcnc: 0.6 g/dL — ABNORMAL LOW (ref 0.7–1.3)
Gamma Glob SerPl Elph-Mcnc: 5.1 g/dL — ABNORMAL HIGH (ref 0.4–1.8)
Globulin, Total: 6.7 g/dL — ABNORMAL HIGH (ref 2.2–3.9)
IgA: 11 mg/dL — ABNORMAL LOW (ref 64–422)
IgG (Immunoglobin G), Serum: 5841 mg/dL — ABNORMAL HIGH (ref 586–1602)
IgM (Immunoglobulin M), Srm: 11 mg/dL — ABNORMAL LOW (ref 26–217)
M Protein SerPl Elph-Mcnc: 4.9 g/dL — ABNORMAL HIGH
Total Protein ELP: 10.1 g/dL — ABNORMAL HIGH (ref 6.0–8.5)

## 2022-08-07 LAB — CBC WITH DIFFERENTIAL (CANCER CENTER ONLY)
Abs Immature Granulocytes: 0.06 10*3/uL (ref 0.00–0.07)
Basophils Absolute: 0 10*3/uL (ref 0.0–0.1)
Basophils Relative: 1 %
Eosinophils Absolute: 0 10*3/uL (ref 0.0–0.5)
Eosinophils Relative: 1 %
HCT: 29.3 % — ABNORMAL LOW (ref 36.0–46.0)
Hemoglobin: 9.4 g/dL — ABNORMAL LOW (ref 12.0–15.0)
Immature Granulocytes: 1 %
Lymphocytes Relative: 16 %
Lymphs Abs: 0.9 10*3/uL (ref 0.7–4.0)
MCH: 26.8 pg (ref 26.0–34.0)
MCHC: 32.1 g/dL (ref 30.0–36.0)
MCV: 83.5 fL (ref 80.0–100.0)
Monocytes Absolute: 0.4 10*3/uL (ref 0.1–1.0)
Monocytes Relative: 7 %
Neutro Abs: 3.9 10*3/uL (ref 1.7–7.7)
Neutrophils Relative %: 74 %
Platelet Count: 202 10*3/uL (ref 150–400)
RBC: 3.51 MIL/uL — ABNORMAL LOW (ref 3.87–5.11)
RDW: 20.7 % — ABNORMAL HIGH (ref 11.5–15.5)
WBC Count: 5.3 10*3/uL (ref 4.0–10.5)
nRBC: 0 % (ref 0.0–0.2)

## 2022-08-07 LAB — CMP (CANCER CENTER ONLY)
ALT: 9 U/L (ref 0–44)
AST: 16 U/L (ref 15–41)
Albumin: 2.6 g/dL — ABNORMAL LOW (ref 3.5–5.0)
Alkaline Phosphatase: 47 U/L (ref 38–126)
Anion gap: 6 (ref 5–15)
BUN: 23 mg/dL (ref 8–23)
CO2: 27 mmol/L (ref 22–32)
Calcium: 8 mg/dL — ABNORMAL LOW (ref 8.9–10.3)
Chloride: 95 mmol/L — ABNORMAL LOW (ref 98–111)
Creatinine: 0.93 mg/dL (ref 0.44–1.00)
GFR, Estimated: 60 mL/min — ABNORMAL LOW (ref >60)
Glucose, Bld: 164 mg/dL — ABNORMAL HIGH (ref 70–99)
Potassium: 3.6 mmol/L (ref 3.5–5.1)
Sodium: 128 mmol/L — ABNORMAL LOW (ref 135–145)
Total Bilirubin: 0.3 mg/dL (ref 0.3–1.2)
Total Protein: 9.7 g/dL — ABNORMAL HIGH (ref 6.5–8.1)

## 2022-08-07 MED ORDER — BORTEZOMIB CHEMO SQ INJECTION 3.5 MG (2.5MG/ML)
1.3000 mg/m2 | Freq: Once | INTRAMUSCULAR | Status: AC
  Administered 2022-08-07: 2.25 mg via SUBCUTANEOUS
  Filled 2022-08-07: qty 0.9

## 2022-08-08 ENCOUNTER — Telehealth: Payer: Self-pay

## 2022-08-08 ENCOUNTER — Inpatient Hospital Stay: Payer: Medicare Other

## 2022-08-08 ENCOUNTER — Other Ambulatory Visit: Payer: Self-pay

## 2022-08-08 ENCOUNTER — Telehealth: Payer: Self-pay | Admitting: *Deleted

## 2022-08-08 ENCOUNTER — Inpatient Hospital Stay (HOSPITAL_BASED_OUTPATIENT_CLINIC_OR_DEPARTMENT_OTHER): Payer: Medicare Other | Admitting: Physician Assistant

## 2022-08-08 VITALS — BP 140/55 | HR 71 | Resp 16

## 2022-08-08 VITALS — BP 142/64 | HR 68 | Temp 98.2°F | Resp 16 | Wt 162.0 lb

## 2022-08-08 DIAGNOSIS — D72829 Elevated white blood cell count, unspecified: Secondary | ICD-10-CM

## 2022-08-08 DIAGNOSIS — I5033 Acute on chronic diastolic (congestive) heart failure: Secondary | ICD-10-CM | POA: Diagnosis not present

## 2022-08-08 DIAGNOSIS — Z5112 Encounter for antineoplastic immunotherapy: Secondary | ICD-10-CM | POA: Diagnosis not present

## 2022-08-08 DIAGNOSIS — M546 Pain in thoracic spine: Secondary | ICD-10-CM | POA: Diagnosis not present

## 2022-08-08 DIAGNOSIS — C9000 Multiple myeloma not having achieved remission: Secondary | ICD-10-CM

## 2022-08-08 DIAGNOSIS — R34 Anuria and oliguria: Secondary | ICD-10-CM

## 2022-08-08 DIAGNOSIS — I110 Hypertensive heart disease with heart failure: Secondary | ICD-10-CM | POA: Diagnosis not present

## 2022-08-08 DIAGNOSIS — E871 Hypo-osmolality and hyponatremia: Secondary | ICD-10-CM

## 2022-08-08 DIAGNOSIS — I509 Heart failure, unspecified: Secondary | ICD-10-CM | POA: Diagnosis not present

## 2022-08-08 DIAGNOSIS — E538 Deficiency of other specified B group vitamins: Secondary | ICD-10-CM | POA: Diagnosis not present

## 2022-08-08 DIAGNOSIS — D649 Anemia, unspecified: Secondary | ICD-10-CM | POA: Diagnosis not present

## 2022-08-08 LAB — CBC WITH DIFFERENTIAL (CANCER CENTER ONLY)
Abs Immature Granulocytes: 0.3 10*3/uL — ABNORMAL HIGH (ref 0.00–0.07)
Basophils Absolute: 0 10*3/uL (ref 0.0–0.1)
Basophils Relative: 0 %
Eosinophils Absolute: 0 10*3/uL (ref 0.0–0.5)
Eosinophils Relative: 0 %
HCT: 27.1 % — ABNORMAL LOW (ref 36.0–46.0)
Hemoglobin: 9 g/dL — ABNORMAL LOW (ref 12.0–15.0)
Immature Granulocytes: 2 %
Lymphocytes Relative: 7 %
Lymphs Abs: 1.2 10*3/uL (ref 0.7–4.0)
MCH: 27.8 pg (ref 26.0–34.0)
MCHC: 33.2 g/dL (ref 30.0–36.0)
MCV: 83.6 fL (ref 80.0–100.0)
Monocytes Absolute: 1.5 10*3/uL — ABNORMAL HIGH (ref 0.1–1.0)
Monocytes Relative: 8 %
Neutro Abs: 14.4 10*3/uL — ABNORMAL HIGH (ref 1.7–7.7)
Neutrophils Relative %: 83 %
Platelet Count: 200 10*3/uL (ref 150–400)
RBC: 3.24 MIL/uL — ABNORMAL LOW (ref 3.87–5.11)
RDW: 20.7 % — ABNORMAL HIGH (ref 11.5–15.5)
WBC Count: 17.3 10*3/uL — ABNORMAL HIGH (ref 4.0–10.5)
nRBC: 0 % (ref 0.0–0.2)

## 2022-08-08 LAB — URINALYSIS, ROUTINE W REFLEX MICROSCOPIC
Bilirubin Urine: NEGATIVE
Glucose, UA: >=500 mg/dL — AB
Hgb urine dipstick: NEGATIVE
Ketones, ur: NEGATIVE mg/dL
Leukocytes,Ua: NEGATIVE
Nitrite: NEGATIVE
Protein, ur: NEGATIVE mg/dL
Specific Gravity, Urine: 1.007 (ref 1.005–1.030)
pH: 5 (ref 5.0–8.0)

## 2022-08-08 LAB — CMP (CANCER CENTER ONLY)
ALT: 10 U/L (ref 0–44)
AST: 22 U/L (ref 15–41)
Albumin: 2.8 g/dL — ABNORMAL LOW (ref 3.5–5.0)
Alkaline Phosphatase: 48 U/L (ref 38–126)
Anion gap: 6 (ref 5–15)
BUN: 29 mg/dL — ABNORMAL HIGH (ref 8–23)
CO2: 26 mmol/L (ref 22–32)
Calcium: 8.2 mg/dL — ABNORMAL LOW (ref 8.9–10.3)
Chloride: 94 mmol/L — ABNORMAL LOW (ref 98–111)
Creatinine: 0.88 mg/dL (ref 0.44–1.00)
GFR, Estimated: >60 mL/min (ref >60)
Glucose, Bld: 104 mg/dL — ABNORMAL HIGH (ref 70–99)
Potassium: 3.9 mmol/L (ref 3.5–5.1)
Sodium: 126 mmol/L — ABNORMAL LOW (ref 135–145)
Total Bilirubin: 0.3 mg/dL (ref 0.3–1.2)
Total Protein: 10.2 g/dL — ABNORMAL HIGH (ref 6.5–8.1)

## 2022-08-08 MED ORDER — SODIUM CHLORIDE 0.9 % IV SOLN: Freq: Once | INTRAVENOUS | Status: AC

## 2022-08-08 NOTE — Progress Notes (Signed)
Orders placed for [facility] visit.

## 2022-08-08 NOTE — Telephone Encounter (Signed)
Patient called and stated that she is currently being treated at the cancer center at [facility] with Dr. Narda Rutherford. Patient is is currently taking Lasix. Patient was instructed to take 5 tablets on the day of her treatment. She was told that this medication is causing her retain fluids and wants to know if she should be taking this medication, because for the first time in 3 years, she has not been getting up in the middle of the night to "go pee". Please advise.

## 2022-08-08 NOTE — Telephone Encounter (Signed)
I do not understand this message, please ask her to send her message on mychart so I can understand this better and reply

## 2022-08-08 NOTE — Progress Notes (Signed)
Symptom Management Consult note [place]    Patient Care Team: [facility] [place] as PCP - General (Rheumatology)    Name of the patient: Zoe [name]  [id number]  1934-10-21   Date of visit: 08/08/2022   Chief Complaint/Reason for visit: decreased urine output   Current Therapy: Velcade  Last treatment:  Day 8   Cycle 1 on 08/07/22   ASSESSMENT & PLAN: Patient is a 88 y.o. female  with oncologic history of newly diagnosed multiple myleoma followed by Dr. Lorenso Courier.  I have viewed most recent oncology note and lab work.    #) Newly diagnosed multiple myleoma  -Had treatment yesterday - Next appointment with oncologist is 08/15/22   #) Decreased urine output -Acute, symptoms less than 24 hours. -Patient well appearing. Benign abdominal exam. Patient gave urine sample filling the cup and reported feeling as if bladder was empty. -UA today shows no signs of infection. Urine culture sent. -Discussed with patient symptoms that could indicate urinary retention. She will continue to measure urine output.  -Engaged in shared decision making with patient and she prefers to monitor urine output at home and defer further work up at this time which would include ED evaluation. -Dr. Lorenso Courier agreeable with plan.   #) Leukocytosis -WBC is 17.3. suspect related to 20 mg dexamethasone PO she took yesterday. -Patient checks temperature at least once daily and has been afebrile. Is also afebrile here in clinic.   #)Hyponatremia - Chart review showing ongoing hyponatremia x at least 3 months. Delos Haring was 128. Today is 126.  -Patient given 500 ml NS in clinic. She does not appear volume overloaded and weight today 162 lb is similar to yesterday 161.8 lb  #) Acute on chronic diastolic HF -Chart review shows she is followed by Dr. Nadyne Coombes.  -Patient without shortness of breath. No signs of volume overload on exam. No hypoxia. Normal work of breathing on  exam with clear lung sounds.   Strict ED precautions discussed should symptoms worsen.   Heme/Onc History: Oncology History  Multiple myeloma not having achieved remission ([place])  07/24/2022 Initial Diagnosis   Multiple myeloma not having achieved remission ([place])   08/01/2022 -  Chemotherapy   Patient is on Treatment Plan : MYELOMA NON-TRANSPLANT CANDIDATES VRd weekly q21d         Interval history-: [name] is a 88 y.o. female with oncologic history as above presenting to [facility] today with chief complaint of decreased urine output x 1 day. Patient states she woke up this morning her depend was dry which is unusual. She went the bathroom and measured her urine output to be 9 oz. She felt as if her bladder was empty. She voided again around 10 am with output 1 oz. She said her urine looked normal in color. She denies any dysuria, gross hematuria. She remembers having a UTI within the last few years that was treated by PCP. She denies any pelvic pain, vaginal discharge, vaginal bleeding. She wears a pad in the depends and typically changes it 8x/day. She denies history of urinary retention. Patient recently started treatment. Her second Velcade injection was yesterday. She states after her first one she had minimal to no side effects. She overall feels fine now. She a history of back pain and reports thoracic back pain yesterday that she described as a soreness. Pain resolved with rest. She reports it feels like her typical back pain. She had 2 episodes of loose stool today,  brown in color. She denies suspicious food intake, recent travel, recent antibiotic use, or sick contacts.      ROS  All other systems are reviewed and are negative for acute change except as noted in the HPI.    Allergies  Allergen Reactions   Penicillins Other (See Comments)    UNSPECIFIED REACTION  Patient does not remember reaction.  Has patient had a PCN reaction causing immediate rash,  facial/tongue/throat swelling, SOB or lightheadedness with hypotension: no Has patient had a PCN reaction causing severe rash involving mucus membranes or skin necrosis: no Has patient had a PCN reaction that required hospitalization no Has patient had a PCN reaction occurring within the last 10 years: no If all of the above answers are "NO", then may proceed with Cephalosporin use.    Sulfa Antibiotics Other (See Comments)    UNSPECIFIED REACTION  "maybe vision issues? "     Past Medical History:  Diagnosis Date   Arthritis    some - per patient   Breast cancer ([place])    breast cancer / left    Cataract    bilat    GERD (gastroesophageal reflux disease)    History of kidney stones    Hyperlipidemia    Hypertension    Hypothyroidism    Macular degeneration    Left   S/P TAVR (transcatheter aortic valve replacement) 09/03/2018   23 mm Edwards Sapien 3 transcatheter heart valve placed via percutaneous right transfemoral approach    Severe aortic stenosis    Stress incontinence    Thyroid disease    Tinnitus      Past Surgical History:  Procedure Laterality Date   ABDOMINAL HYSTERECTOMY  1970's   BACK SURGERY     BREAST LUMPECTOMY  12/1998   lumpectomy   CARDIAC CATHETERIZATION     EYE SURGERY     cataract surgery bilat    INTRAOPERATIVE TRANSTHORACIC ECHOCARDIOGRAM N/A 09/03/2018   Procedure: INTRAOPERATIVE TRANSTHORACIC ECHOCARDIOGRAM;  Surgeon: Burnell Blanks, MD;  Location: [place];  Service: Open Heart Surgery;  Laterality: N/A;   LITHOTRIPSY     Right total knee     2018 Dr. Alvan Dame   RIGHT/LEFT HEART CATH AND CORONARY ANGIOGRAPHY N/A 08/06/2018   Procedure: RIGHT/LEFT HEART CATH AND CORONARY ANGIOGRAPHY;  Surgeon: Adrian Prows, MD;  Location: [place] [facility];  Service: Cardiovascular;  Laterality: N/A;   THYROIDECTOMY, PARTIAL  1975   TONSILLECTOMY     as a child - patient not sure of exact date   TOTAL KNEE ARTHROPLASTY Left 03/13/2016   Procedure: TOTAL  KNEE ARTHROPLASTY;  Surgeon: Paralee Cancel, MD;  Location: [facility];  Service: Orthopedics;  Laterality: Left;   TOTAL KNEE ARTHROPLASTY Right 06/18/2017   Procedure: RIGHT TOTAL KNEE ARTHROPLASTY;  Surgeon: Paralee Cancel, MD;  Location: [facility];  Service: Orthopedics;  Laterality: Right;   TRANSCATHETER AORTIC VALVE REPLACEMENT, TRANSFEMORAL N/A 09/03/2018   Procedure: TRANSCATHETER AORTIC VALVE REPLACEMENT, TRANSFEMORAL;  Surgeon: Burnell Blanks, MD;  Location: [place];  Service: Open Heart Surgery;  Laterality: N/A;    Social History   Socioeconomic History   Marital status: Widowed    Spouse name: Not on file   Number of children: 4   Years of education: Not on file   Highest education level: Master's degree (e.g., MA, MS, MEng, MEd, MSW, MBA)  Occupational History   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  Tobacco Use   Smoking status: Never   Smokeless tobacco:  Never  Vaping Use   Vaping Use: Never used  Substance and Sexual Activity   Alcohol use: No   Drug use: No   Sexual activity: Not Currently  Other Topics Concern   Not on file  Social History Narrative   Not on file   Social Determinants of Health   Financial Resource Strain: Low Risk  (11/12/2018)   Overall Financial Resource Strain (CARDIA)    Difficulty of Paying Living Expenses: Not very hard  Food Insecurity: No Food Insecurity (11/12/2018)   Hunger Vital Sign    Worried About Running Out of Food in the Last Year: Never true    Ran Out of Food in the Last Year: Never true  Transportation Needs: No Transportation Needs (11/12/2018)   PRAPARE - Hydrologist (Medical): No    Lack of Transportation (Non-Medical): No  Physical Activity: Inactive (11/12/2018)   Exercise Vital Sign    Days of Exercise per Week: 0 days    Minutes of Exercise per Session: 0 min  Stress: Stress Concern Present (11/12/2018)   [place]    Feeling of Stress : To some extent  Social Connections: Not on file  Intimate Partner Violence: Not on file    Family History  Problem Relation Age of Onset   Diabetes Mother    Stroke Mother        Carotid artery disease   Heart disease Father        CAD   Coronary artery disease Father    Diabetes Sister      Current Outpatient Medications:    acyclovir (ZOVIRAX) 400 MG tablet, Take 1 tablet (400 mg total) by mouth 2 (two) times daily., Disp: 180 tablet, Rfl: 3   Artificial Tear Solution (SOOTHE XP) SOLN, Place 1 drop into both eyes every evening., Disp: , Rfl:    aspirin 81 MG chewable tablet, Chew 1 tablet (81 mg total) by mouth daily. (Patient taking differently: Chew 81 mg by mouth every evening.), Disp: , Rfl:    carvedilol (COREG) 12.5 MG tablet, Take 12.5 mg by mouth 2 (two) times daily with a meal., Disp: , Rfl:    Cholecalciferol (VITAMIN D3) 2000 units TABS, Take 2,000 Units by mouth daily., Disp: , Rfl:    cyanocobalamin (VITAMIN B12) 1000 MCG tablet, Take 1 tablet (1,000 mcg total) by mouth daily., Disp: 30 tablet, Rfl: 3   dapagliflozin propanediol (FARXIGA) 10 MG TABS tablet, Take 1 tablet (10 mg total) by mouth daily., Disp: 30 tablet, Rfl: 2   dexamethasone (DECADRON) 4 MG tablet, Take 5 tablets (20 mg total) by mouth once a week. Take 5 tablets in the morning on days when receiving the chemotherapy shot., Disp: 20 tablet, Rfl: 5   esomeprazole (NEXIUM) 20 MG capsule, Take 20 mg by mouth daily., Disp: , Rfl:    ferrous sulfate 325 (65 FE) MG tablet, Take 325 mg by mouth daily with breakfast., Disp: , Rfl:    furosemide (LASIX) 20 MG tablet, PLEASE SEE ATTACHED FOR DETAILED DIRECTIONS, Disp: 90 tablet, Rfl: 1   levothyroxine (SYNTHROID, LEVOTHROID) 100 MCG tablet, Take 100 mcg by mouth daily before breakfast., Disp: , Rfl: 2   losartan (COZAAR) 25 MG tablet, Take 1 tablet (25 mg total) by mouth daily., Disp: 90 tablet, Rfl: 1    Multiple Vitamins-Minerals (PRESERVISION AREDS 2) CAPS, Take 1 capsule by mouth 2 (two) times daily., Disp: , Rfl:  ondansetron (ZOFRAN) 8 MG tablet, Take 1 tablet (8 mg total) by mouth every 8 (eight) hours as needed., Disp: 30 tablet, Rfl: 0   potassium chloride (KLOR-CON M) 10 MEQ tablet, Take 2 tablets (20 mEq total) by mouth daily as needed. Take with furosemide, Disp: 180 tablet, Rfl: 0   pravastatin (PRAVACHOL) 40 MG tablet, Take 40 mg by mouth every evening., Disp: , Rfl:    prochlorperazine (COMPAZINE) 10 MG tablet, Take 1 tablet (10 mg total) by mouth every 6 (six) hours as needed for nausea or vomiting., Disp: 30 tablet, Rfl: 0  PHYSICAL EXAM: ECOG FS:1 - Symptomatic but completely ambulatory    Vitals:   08/08/22 1147  BP: (!) 142/64  Pulse: 68  Resp: 16  Temp: 98.2 F (36.8 C)  TempSrc: Oral  SpO2: 99%  Weight: 162 lb (73.5 kg)   Physical Exam Vitals and nursing note reviewed.  Constitutional:      Appearance: She is well-developed. She is not ill-appearing or toxic-appearing.  HENT:     Head: Normocephalic.     Nose: Nose normal.     Mouth/Throat:     Mouth: Mucous membranes are moist.  Eyes:     Conjunctiva/sclera: Conjunctivae normal.  Neck:     Vascular: No JVD.  Cardiovascular:     Rate and Rhythm: Normal rate and regular rhythm.     Pulses: Normal pulses.     Heart sounds: Murmur heard.  Pulmonary:     Effort: Pulmonary effort is normal. No respiratory distress.     Breath sounds: Normal breath sounds. No stridor. No wheezing, rhonchi or rales.  Chest:     Chest wall: No tenderness.  Abdominal:     General: Bowel sounds are normal. There is no distension.     Palpations: Abdomen is soft. There is no mass.     Tenderness: There is no abdominal tenderness. There is no right CVA tenderness, left CVA tenderness, guarding or rebound.     Hernia: No hernia is present.  Musculoskeletal:     Cervical back: Normal range of motion.     Right lower leg: No  edema.     Left lower leg: No edema.  Skin:    General: Skin is warm and dry.     Findings: No rash.  Neurological:     Mental Status: She is oriented to person, place, and time.     Comments: Sensation grossly intact to light touch in the lower extremities bilaterally. No saddle anesthesias. Strength 5/5 with flexion and extension at the bilateral hips, knees, and ankles. No noted gait deficit, ambulatory with cane Coordination intact with heel to shin testing.          LABORATORY DATA: I have reviewed the data as listed    Latest Ref Rng & Units 08/08/2022   11:15 AM 08/07/2022    7:50 AM 08/01/2022    9:50 AM  CBC  WBC 4.0 - 10.5 K/uL 17.3  5.3  7.0   Hemoglobin 12.0 - 15.0 g/dL 9.0  9.4  8.9   Hematocrit 36.0 - 46.0 % 27.1  29.3  27.5   Platelets 150 - 400 K/uL 200  202  182         Latest Ref Rng & Units 08/08/2022   11:15 AM 08/07/2022    7:50 AM 08/01/2022    9:50 AM  CMP  Glucose 70 - 99 mg/dL 104  164  125   BUN 8 - 23 mg/dL 29  23  26   Creatinine 0.44 - 1.00 mg/dL 0.88  0.93  0.91   Sodium 135 - 145 mmol/L 126  128  129   Potassium 3.5 - 5.1 mmol/L 3.9  3.6  4.2   Chloride 98 - 111 mmol/L 94  95  96   CO2 22 - 32 mmol/L '26  27  26   ' Calcium 8.9 - 10.3 mg/dL 8.2  8.0  8.2   Total Protein 6.5 - 8.1 g/dL 10.2  9.7  10.4   Total Bilirubin 0.3 - 1.2 mg/dL 0.3  0.3  0.3   Alkaline Phos 38 - 126 U/L 48  47  47   AST 15 - 41 U/L '22  16  17   ' ALT 0 - 44 U/L '10  9  9        ' RADIOGRAPHIC STUDIES (from last 24 hours if applicable) I have personally reviewed the radiological images as listed and agreed with the findings in the report. No results found.      Visit Diagnosis: 1. Decreased urine output   2. Multiple myeloma not having achieved remission ([place])   3. Hyponatremia   4. Leukocytosis, unspecified type   5. Acute on chronic congestive heart failure, unspecified heart failure type ([place])      No orders of the defined types were placed in  this encounter.   All questions were answered. The patient knows to call the clinic with any problems, questions or concerns. No barriers to learning was detected.  I have spent a total of 20 minutes minutes of face-to-face and non-face-to-face time, preparing to see the patient, obtaining and/or reviewing separately obtained history, performing a medically appropriate examination, counseling and educating the patient, ordering tests, documenting clinical information in the electronic health record, and care coordination (communications with other health care professionals or caregivers).    Thank you for allowing me to participate in the care of this patient.    Barrie Folk, PA-C Department of Hematology/Oncology [facility] Phone: [phone]  [phone]    08/08/2022 3:51 PM

## 2022-08-08 NOTE — Patient Instructions (Signed)
Rehydration Rehydration is the replacement of body fluids, salts, and minerals (electrolytes) that are lost during dehydration. Dehydration is when there is not enough water or other fluids in the body. This happens when you lose more fluids than you take in. People who are age 86 or older have a higher risk of dehydration than younger adults. Common causes of dehydration include: Conditions that cause loss of water or other fluids, such as diarrhea, vomiting, sweating, or urinating a lot. Not drinking enough fluids. This can occur when you are ill or doing activities that require a lot of energy, especially in hot weather. Other illnesses and conditions, such as fever or infection. Certain medicines, such as those that remove excess fluid from the body (diuretics). Not being able to get enough water and food. Symptoms of mild or moderate dehydration may include thirst, dry lips and mouth, and dizziness. Symptoms of severe dehydration may include increased heart rate, confusion, fainting, and not urinating. For severe dehydration, you may need to get fluids through an IV at the hospital. For mild or moderate dehydration, you can usually rehydrate at home by drinking certain fluids as told by your health care provider. What are the risks? Generally, rehydration is safe. However, taking in too much fluid (overhydration) can be a problem. This is rare. Overhydration can cause an electrolyte imbalance, kidney failure, fluid in the lungs, or a decrease in salt (sodium) levels in the body. Supplies needed: You will need an oral rehydration solution (ORS) if your health care provider tells you to use one. This is a drink designed to treat dehydration. It can be found in pharmacies and retail stores. How to rehydrate Fluids Follow instructions from your health care provider for rehydration. The kind of fluid and the amount you should drink depend on your condition. In general, for mild dehydration, you should  choose drinks that you prefer. If told by your health care provider, drink an ORS. Make an ORS by following instructions on the package. Start by drinking small amounts, about  cup (120 mL) every 5-10 minutes. Slowly increase how much you drink until you have taken the amount recommended by your health care provider. Drink enough fluids to keep your urine pale yellow. If you were told to drink an ORS, finish the ORS first, then start slowly drinking other clear fluids. Drink fluids such as: Water. This includes sparkling water and flavored water. Drinking only waterwhile rehydrating can lead to having too little sodium in your body (hyponatremia). Follow instructions from your health care provider. Water from ice chips you suck on. Fruit juice with water you add to it(diluted). Sports drinks. Hot or cold herbal teas. Broth-based soups. Coffee. Milk or milk products. Food Follow instructions from your health care provider about what to eat while you rehydrate. Your health care provider may recommend that you slowly begin eating regular foods in small amounts. Eat foods that contain a healthy balance of electrolytes, such as bananas, oranges, potatoes, tomatoes, and spinach. Avoid foods that are greasy or contain a lot of sugar. In some cases, you may get nutrition through a feeding tube that is passed through your nose and into your stomach (nasogastric tube, or NG tube). This may be done if you have uncontrolled vomiting or diarrhea. Beverages to avoid  Certain beverages may make dehydration worse. While you rehydrate, avoid drinking alcohol. How to tell if you are recovering from dehydration You may be recovering from dehydration if: You are urinating more often than before you   started rehydrating. Your urine is pale yellow. Your energy level improves. You vomit less frequently. You have diarrhea less frequently. Your appetite improves or returns to normal. You feel less dizzy or less  light-headed. Your skin tone and color start to look more normal. Follow these instructions at home: Take over-the-counter and prescription medicines only as told by your health care provider. Do not take sodium tablets. Doing this can lead to having too much sodium in your body (hypernatremia). Contact a health care provider if: You continue to have symptoms of mild or moderate dehydration, such as: Thirst. Dry lips. Slightly dry mouth. Dizziness. Dark urine or less urine than usual. Muscle cramps. You continue to vomit or have diarrhea. Get help right away if you: Have symptoms of dehydration that get worse. Have a fever. Have a severe headache. Have been vomiting and the following happens: Your vomiting gets worse. Your vomit includes blood or green matter (bile). You cannot eat or drink without vomiting. Have problems with urination or bowel movements, such as: Diarrhea that gets worse. Blood in your stool (feces). This may cause stool to look black and tarry. Not urinating, or urinating only a small amount of very dark urine, within 6-8 hours. Have trouble breathing. Have symptoms that get worse with treatment. These symptoms may represent a serious problem that is an emergency. Do not wait to see if the symptoms will go away. Get medical help right away. Call your local emergency services (911 in the [place] Do not drive yourself to the hospital. Summary Rehydration is the replacement of body fluids, salts, and minerals (electrolytes) that are lost during dehydration. Follow instructions from your health care provider for rehydration. The kind of fluid and the amount you should drink depend on your condition. Slowly increase how much you drink until you have taken the amount recommended by your health care provider. Contact your health care provider if you continue to show signs of mild or moderate dehydration. This information is not intended to replace advice given to you by  your health care provider. Make sure you discuss any questions you have with your health care provider. Document Revised: 12/10/2019 Document Reviewed: 11/27/2019 Elsevier Patient Education  2023 Elsevier Inc.  

## 2022-08-08 NOTE — Telephone Encounter (Signed)
Zoe [name] had Velcade yestersday. States she has had 2 episodes of diarrhea. Is eating and drinking well. Is concerned about lack of urine output. Voided 9 ounces at 0630 and 1/2 ounce around 1000, did not void at all during night. Had small amount of urine with diarrhea. States the last time she really voided was on Sunday. Denies any swelling in feet or hands, no shortness of breath. Has been watching sodium.   Pt will come over to lab and [facility] this morning

## 2022-08-09 ENCOUNTER — Telehealth: Payer: Self-pay

## 2022-08-09 NOTE — Telephone Encounter (Signed)
Patient called to inform us about her cancer treatment and to make sure that she could take the medication that was prescribed for the treatment, Explained to patient what Dr. Einar Gip said in his last message pertaining to this matter.

## 2022-08-09 NOTE — Telephone Encounter (Signed)
Yes it retains fluid. She can take furosemide daily or every other day for a short time. Weight loss and salt and calorie restriction is most important otherwise she will go into heart failure

## 2022-08-09 NOTE — Telephone Encounter (Signed)
The medication she was put on was Dexamethasone. Does this medication cause her to retain fluids? Everytime she takes it, she does not urinate much as she does, when she does not take it. Please advise.

## 2022-08-10 ENCOUNTER — Other Ambulatory Visit (HOSPITAL_BASED_OUTPATIENT_CLINIC_OR_DEPARTMENT_OTHER): Payer: Self-pay | Admitting: Physician Assistant

## 2022-08-10 DIAGNOSIS — N390 Urinary tract infection, site not specified: Secondary | ICD-10-CM

## 2022-08-10 LAB — URINE CULTURE: Culture: 50000 — AB

## 2022-08-10 MED ORDER — NITROFURANTOIN MONOHYD MACRO 100 MG PO CAPS: 100.0000 mg | ORAL_CAPSULE | Freq: Two times a day (BID) | ORAL | 0 refills | Status: DC

## 2022-08-10 NOTE — Telephone Encounter (Signed)
Patient is aware. She did want you t know that she did gain 2 lbs since starting this medication (Dexamethasone) She said that she will be sending you a Mychart message about it.

## 2022-08-10 NOTE — Progress Notes (Signed)
Called patient to inform her of urine culture results. Urine culture grew 50,000 colonies/ml E. Coli with pan sensitive. As patient was symptomatic and had leukocytosis 17.2 from clinic visit x 2 days ago. Patient has allergies to penicillins and sulfa so Macrobid prescribed. Patient added that her decreased urine output has resolved.

## 2022-08-15 ENCOUNTER — Inpatient Hospital Stay: Payer: Medicare Other

## 2022-08-15 ENCOUNTER — Inpatient Hospital Stay: Payer: Medicare Other | Admitting: Hematology and Oncology

## 2022-08-15 ENCOUNTER — Other Ambulatory Visit: Payer: Self-pay

## 2022-08-15 VITALS — BP 145/60 | HR 70 | Temp 97.9°F | Resp 17 | Ht 62.0 in | Wt 161.6 lb

## 2022-08-15 DIAGNOSIS — D72829 Elevated white blood cell count, unspecified: Secondary | ICD-10-CM | POA: Diagnosis not present

## 2022-08-15 DIAGNOSIS — I5033 Acute on chronic diastolic (congestive) heart failure: Secondary | ICD-10-CM | POA: Diagnosis not present

## 2022-08-15 DIAGNOSIS — R34 Anuria and oliguria: Secondary | ICD-10-CM | POA: Diagnosis not present

## 2022-08-15 DIAGNOSIS — Z5112 Encounter for antineoplastic immunotherapy: Secondary | ICD-10-CM | POA: Diagnosis not present

## 2022-08-15 DIAGNOSIS — I110 Hypertensive heart disease with heart failure: Secondary | ICD-10-CM | POA: Diagnosis not present

## 2022-08-15 DIAGNOSIS — D649 Anemia, unspecified: Secondary | ICD-10-CM | POA: Diagnosis not present

## 2022-08-15 DIAGNOSIS — C9000 Multiple myeloma not having achieved remission: Secondary | ICD-10-CM

## 2022-08-15 DIAGNOSIS — M546 Pain in thoracic spine: Secondary | ICD-10-CM | POA: Diagnosis not present

## 2022-08-15 DIAGNOSIS — E871 Hypo-osmolality and hyponatremia: Secondary | ICD-10-CM | POA: Diagnosis not present

## 2022-08-15 DIAGNOSIS — E538 Deficiency of other specified B group vitamins: Secondary | ICD-10-CM | POA: Diagnosis not present

## 2022-08-15 LAB — CMP (CANCER CENTER ONLY)
ALT: 9 U/L (ref 0–44)
AST: 17 U/L (ref 15–41)
Albumin: 2.7 g/dL — ABNORMAL LOW (ref 3.5–5.0)
Alkaline Phosphatase: 49 U/L (ref 38–126)
Anion gap: 5 (ref 5–15)
BUN: 18 mg/dL (ref 8–23)
CO2: 27 mmol/L (ref 22–32)
Calcium: 8.4 mg/dL — ABNORMAL LOW (ref 8.9–10.3)
Chloride: 93 mmol/L — ABNORMAL LOW (ref 98–111)
Creatinine: 0.82 mg/dL (ref 0.44–1.00)
GFR, Estimated: >60 mL/min (ref >60)
Glucose, Bld: 113 mg/dL — ABNORMAL HIGH (ref 70–99)
Potassium: 4 mmol/L (ref 3.5–5.1)
Sodium: 125 mmol/L — ABNORMAL LOW (ref 135–145)
Total Bilirubin: 0.3 mg/dL (ref 0.3–1.2)
Total Protein: 9.7 g/dL — ABNORMAL HIGH (ref 6.5–8.1)

## 2022-08-15 LAB — CBC WITH DIFFERENTIAL (CANCER CENTER ONLY)
Abs Immature Granulocytes: 0.05 10*3/uL (ref 0.00–0.07)
Basophils Absolute: 0 10*3/uL (ref 0.0–0.1)
Basophils Relative: 1 %
Eosinophils Absolute: 0 10*3/uL (ref 0.0–0.5)
Eosinophils Relative: 1 %
HCT: 30 % — ABNORMAL LOW (ref 36.0–46.0)
Hemoglobin: 9.9 g/dL — ABNORMAL LOW (ref 12.0–15.0)
Immature Granulocytes: 1 %
Lymphocytes Relative: 11 %
Lymphs Abs: 0.7 10*3/uL (ref 0.7–4.0)
MCH: 27.6 pg (ref 26.0–34.0)
MCHC: 33 g/dL (ref 30.0–36.0)
MCV: 83.6 fL (ref 80.0–100.0)
Monocytes Absolute: 0.4 10*3/uL (ref 0.1–1.0)
Monocytes Relative: 7 %
Neutro Abs: 5.2 10*3/uL (ref 1.7–7.7)
Neutrophils Relative %: 79 %
Platelet Count: 186 10*3/uL (ref 150–400)
RBC: 3.59 MIL/uL — ABNORMAL LOW (ref 3.87–5.11)
RDW: 20.3 % — ABNORMAL HIGH (ref 11.5–15.5)
WBC Count: 6.5 10*3/uL (ref 4.0–10.5)
nRBC: 0 % (ref 0.0–0.2)

## 2022-08-15 MED ORDER — BORTEZOMIB CHEMO SQ INJECTION 3.5 MG (2.5MG/ML)
1.3000 mg/m2 | Freq: Once | INTRAMUSCULAR | Status: AC
  Administered 2022-08-15: 2.25 mg via SUBCUTANEOUS
  Filled 2022-08-15: qty 0.9

## 2022-08-15 NOTE — Progress Notes (Signed)
Patient states she took her decadron at home prior to arrival

## 2022-08-15 NOTE — Patient Instructions (Signed)
[facility]  Discharge Instructions: Thank you for choosing [facility] to provide your oncology and hematology care.   If you have a lab appointment with the [facility], please go directly to the [facility] and check in at the registration area.   Wear comfortable clothing and clothing appropriate for easy access to any Portacath or PICC line.   We strive to give you quality time with your provider. You may need to reschedule your appointment if you arrive late (15 or more minutes).  Arriving late affects you and other patients whose appointments are after yours.  Also, if you miss three or more appointments without notifying the office, you may be dismissed from the clinic at the provider's discretion.      For prescription refill requests, have your pharmacy contact our office and allow 72 hours for refills to be completed.    Today you received the following chemotherapy and/or immunotherapy agents: bortezomib (Velcade)      To help prevent nausea and vomiting after your treatment, we encourage you to take your nausea medication as directed.  BELOW ARE SYMPTOMS THAT SHOULD BE REPORTED IMMEDIATELY: *FEVER GREATER THAN 100.4 F (38 C) OR HIGHER *CHILLS OR SWEATING *NAUSEA AND VOMITING THAT IS NOT CONTROLLED WITH YOUR NAUSEA MEDICATION *UNUSUAL SHORTNESS OF BREATH *UNUSUAL BRUISING OR BLEEDING *URINARY PROBLEMS (pain or burning when urinating, or frequent urination) *BOWEL PROBLEMS (unusual diarrhea, constipation, pain near the anus) TENDERNESS IN MOUTH AND THROAT WITH OR WITHOUT PRESENCE OF ULCERS (sore throat, sores in mouth, or a toothache) UNUSUAL RASH, SWELLING OR PAIN  UNUSUAL VAGINAL DISCHARGE OR ITCHING   Items with * indicate a potential emergency and should be followed up as soon as possible or go to the Emergency Department if any problems should occur.  Please show the CHEMOTHERAPY ALERT CARD or IMMUNOTHERAPY ALERT CARD at  check-in to the Emergency Department and triage nurse.  Should you have questions after your visit or need to cancel or reschedule your appointment, please contact [facility]  Dept: [phone]  and follow the prompts.  Office hours are 8:00 a.m. to 4:30 p.m. Monday - Friday. Please note that voicemails left after 4:00 p.m. may not be returned until the following business day.  We are closed weekends and major holidays. You have access to a nurse at all times for urgent questions. Please call the main number to the clinic Dept: [phone] and follow the prompts.   For any non-urgent questions, you may also contact your provider using MyChart. We now offer e-Visits for anyone 32 and older to request care online for non-urgent symptoms. For details visit [email or web address].   Also download the MyChart app! Go to the app store, search "MyChart", open the app, select [facility], and log in with your MyChart username and password.  Masks are optional in the cancer centers. If you would like for your care team to wear a mask while they are taking care of you, please let them know. You may have one support Nathanyel Defenbaugh who is at least 86 years old accompany you for your appointments.  Bortezomib Injection What is this medication? BORTEZOMIB (bor TEZ oh mib) treats lymphoma. It may also be used to treat multiple myeloma, a type of bone marrow cancer. It works by blocking a protein that causes cancer cells to grow and multiply. This helps to slow or stop the spread of cancer cells. This medicine may be used for other  purposes; ask your health care provider or pharmacist if you have questions. COMMON BRAND NAME(S): Velcade What should I tell my care team before I take this medication? They need to know if you have any of these conditions: Dehydration Diabetes Heart disease Liver disease Tingling of the fingers or toes or other nerve disorder An unusual or allergic  reaction to bortezomib, other medications, foods, dyes, or preservatives If you or your partner are pregnant or trying to get pregnant Breastfeeding How should I use this medication? This medication is injected into a vein or under the skin. It is given by your care team in a hospital or clinic setting. Talk to your care team about the use of this medication in children. Special care may be needed. Overdosage: If you think you have taken too much of this medicine contact a poison control center or emergency room at once. NOTE: This medicine is only for you. Do not share this medicine with others. What if I miss a dose? Keep appointments for follow-up doses. It is important not to miss your dose. Call your care team if you are unable to keep an appointment. What may interact with this medication? Ketoconazole Rifampin This list may not describe all possible interactions. Give your health care provider a list of all the medicines, herbs, non-prescription drugs, or dietary supplements you use. Also tell them if you smoke, drink alcohol, or use illegal drugs. Some items may interact with your medicine. What should I watch for while using this medication? Your condition will be monitored carefully while you are receiving this medication. You may need blood work while taking this medication. This medication may affect your coordination, reaction time, or judgment. Do not drive or operate machinery until you know how this medication affects you. Sit up or stand slowly to reduce the risk of dizzy or fainting spells. Drinking alcohol with this medication can increase the risk of these side effects. This medication may increase your risk of getting an infection. Call your care team for advice if you get a fever, chills, sore throat, or other symptoms of a cold or flu. Do not treat yourself. Try to avoid being around people who are sick. Check with your care team if you have severe diarrhea, nausea, and  vomiting, or if you sweat a lot. The loss of too much body fluid may make it dangerous for you to take this medication. Talk to your care team if you may be pregnant. Serious birth defects can occur if you take this medication during pregnancy and for 7 months after the last dose. You will need a negative pregnancy test before starting this medication. Contraception is recommended while taking this medication and for 7 months after the last dose. Your care team can help you find the option that works for you. If your partner can get pregnant, use a condom during sex while taking this medication and for 4 months after the last dose. Do not breastfeed while taking this medication and for 2 months after the last dose. This medication may cause infertility. Talk to your care team if you are concerned about your fertility. What side effects may I notice from receiving this medication? Side effects that you should report to your care team as soon as possible: Allergic reactions--skin rash, itching, hives, swelling of the face, lips, tongue, or throat Bleeding--bloody or black, tar-like stools, vomiting blood or brown material that looks like coffee grounds, red or dark brown urine, small red or purple spots   on skin, unusual bruising or bleeding Bleeding in the brain--severe headache, stiff neck, confusion, dizziness, change in vision, numbness or weakness of the face, arm, or leg, trouble speaking, trouble walking, vomiting Bowel blockage--stomach cramping, unable to have a bowel movement or pass gas, loss of appetite, vomiting Heart failure--shortness of breath, swelling of the ankles, feet, or hands, sudden weight gain, unusual weakness or fatigue Infection--fever, chills, cough, sore throat, wounds that don't heal, pain or trouble when passing urine, general feeling of discomfort or being unwell Liver injury--right upper belly pain, loss of appetite, nausea, light-colored stool, dark yellow or brown urine,  yellowing skin or eyes, unusual weakness or fatigue Low blood pressure--dizziness, feeling faint or lightheaded, blurry vision Lung injury--shortness of breath or trouble breathing, cough, spitting up blood, chest pain, fever Pain, tingling, or numbness in the hands or feet Severe or prolonged diarrhea Stomach pain, bloody diarrhea, pale skin, unusual weakness or fatigue, decrease in the amount of urine, which may be signs of hemolytic uremic syndrome Sudden and severe headache, confusion, change in vision, seizures, which may be signs of posterior reversible encephalopathy syndrome (PRES) TTP--purple spots on the skin or inside the mouth, pale skin, yellowing skin or eyes, unusual weakness or fatigue, fever, fast or irregular heartbeat, confusion, change in vision, trouble speaking, trouble walking Tumor lysis syndrome (TLS)--nausea, vomiting, diarrhea, decrease in the amount of urine, dark urine, unusual weakness or fatigue, confusion, muscle pain or cramps, fast or irregular heartbeat, joint pain Side effects that usually do not require medical attention (report to your care team if they continue or are bothersome): Constipation Diarrhea Fatigue Loss of appetite Nausea This list may not describe all possible side effects. Call your doctor for medical advice about side effects. You may report side effects to FDA at 1-800-FDA-1088. Where should I keep my medication? This medication is given in a hospital or clinic. It will not be stored at home. NOTE: This sheet is a summary. It may not cover all possible information. If you have questions about this medicine, talk to your doctor, pharmacist, or health care provider.  2023 Elsevier/Gold Standard (2022-03-08 00:00:00)

## 2022-08-15 NOTE — Progress Notes (Signed)
[facility] [phone]   [phone]  PROGRESS NOTE  Patient Care Team: [facility] as PCP - General (Rheumatology)  Hematological/Oncological History # IgG Lambda Multiple Myeloma  06/14/2022: establish care with [name] due to anemia. Labs showed M protein 4.9, Kappa 7.2, Lambda 10.8, ratio 0.67 07/13/2022: Bmbx showed Lambda restricted plasma cell neoplasm involving approximately 60% of the cellular marrow by IHC on the biopsy.  08/01/2022: Cycle 1 Day 1 of VRd chemotherapy. (Holding revlimid initially)  Interval History:  Zoe [name] 88 y.o. female with medical history significant for newly diagnosed multiple myeloma who presents for a follow up visit. The patient's last visit was on 08/01/2022. In the interim since the last visit she started Vd chemotherapy.   On exam today [name] reports she has had minimal symptoms with her first 2 shots.  She reports that she did have an episode where she came to send management clinic because she did not urinate overnight.  This occurred for just 1 day and the cause is still unclear.  She reports that now she is urinating without difficulty.  She notes that her only issue has been continued back pain.  She is using heating pad and Tylenol 1000 mg every 8 hours to help with this.  She reports that she does not feel "any worse".  She notes that she does drink plenty of water.  She is not having any issues with numbness or tingling as a result of the shots.  She is not having any injection site itching or rash, though she is having some bruising at the site.  She denies any fevers, chills, sweats, nausea, vomiting, diarrhea.  Full 10 point ROS was otherwise negative.   MEDICAL HISTORY:  Past Medical History:  Diagnosis Date   Arthritis    some - per patient   Breast cancer ([facility])    breast cancer / left    Cataract    bilat    GERD (gastroesophageal reflux disease)    History  of kidney stones    Hyperlipidemia    Hypertension    Hypothyroidism    Macular degeneration    Left   S/P TAVR (transcatheter aortic valve replacement) 09/03/2018   23 mm Edwards Sapien 3 transcatheter heart valve placed via percutaneous right transfemoral approach    Severe aortic stenosis    Stress incontinence    Thyroid disease    Tinnitus     SURGICAL HISTORY: Past Surgical History:  Procedure Laterality Date   ABDOMINAL HYSTERECTOMY  1970's   BACK SURGERY     BREAST LUMPECTOMY  12/1998   lumpectomy   CARDIAC CATHETERIZATION     EYE SURGERY     cataract surgery bilat    INTRAOPERATIVE TRANSTHORACIC ECHOCARDIOGRAM N/A 09/03/2018   Procedure: INTRAOPERATIVE TRANSTHORACIC ECHOCARDIOGRAM;  Surgeon: Burnell Blanks, MD;  Location: [facility];  Service: Open Heart Surgery;  Laterality: N/A;   LITHOTRIPSY     Right total knee     2018 Dr. Alvan Dame   RIGHT/LEFT HEART CATH AND CORONARY ANGIOGRAPHY N/A 08/06/2018   Procedure: RIGHT/LEFT HEART CATH AND CORONARY ANGIOGRAPHY;  Surgeon: Adrian Prows, MD;  Location: [facility];  Service: Cardiovascular;  Laterality: N/A;   THYROIDECTOMY, PARTIAL  1975   TONSILLECTOMY     as a child - patient not sure of exact date   TOTAL KNEE ARTHROPLASTY Left 03/13/2016   Procedure: TOTAL KNEE ARTHROPLASTY;  Surgeon: Paralee Cancel, MD;  Location: [facility];  Service: Orthopedics;  Laterality: Left;   TOTAL KNEE ARTHROPLASTY Right 06/18/2017   Procedure: RIGHT TOTAL KNEE ARTHROPLASTY;  Surgeon: Paralee Cancel, MD;  Location: [facility];  Service: Orthopedics;  Laterality: Right;   TRANSCATHETER AORTIC VALVE REPLACEMENT, TRANSFEMORAL N/A 09/03/2018   Procedure: TRANSCATHETER AORTIC VALVE REPLACEMENT, TRANSFEMORAL;  Surgeon: Burnell Blanks, MD;  Location: [facility];  Service: Open Heart Surgery;  Laterality: N/A;    SOCIAL HISTORY: Social History   Socioeconomic History   Marital status: Widowed    Spouse name: Not on file   Number of children: 4    Years of education: Not on file   Highest education level: Master's degree (e.g., MA, MS, MEng, MEd, MSW, MBA)  Occupational History   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  Tobacco Use   Smoking status: Never   Smokeless tobacco: Never  Vaping Use   Vaping Use: Never used  Substance and Sexual Activity   Alcohol use: No   Drug use: No   Sexual activity: Not Currently  Other Topics Concern   Not on file  Social History Narrative   Not on file   Social Determinants of Health   Financial Resource Strain: Low Risk  (11/12/2018)   Overall Financial Resource Strain (CARDIA)    Difficulty of Paying Living Expenses: Not very hard  Food Insecurity: No Food Insecurity (11/12/2018)   Hunger Vital Sign    Worried About Running Out of Food in the Last Year: Never true    Ran Out of Food in the Last Year: Never true  Transportation Needs: No Transportation Needs (11/12/2018)   PRAPARE - Hydrologist (Medical): No    Lack of Transportation (Non-Medical): No  Physical Activity: Inactive (11/12/2018)   Exercise Vital Sign    Days of Exercise per Week: 0 days    Minutes of Exercise per Session: 0 min  Stress: Stress Concern Present (11/12/2018)   [place]    Feeling of Stress : To some extent  Social Connections: Not on file  Intimate Partner Violence: Not on file    FAMILY HISTORY: Family History  Problem Relation Age of Onset   Diabetes Mother    Stroke Mother        Carotid artery disease   Heart disease Father        CAD   Coronary artery disease Father    Diabetes Sister     ALLERGIES:  is allergic to penicillins and sulfa antibiotics.  MEDICATIONS:  Current Outpatient Medications  Medication Sig Dispense Refill   acyclovir (ZOVIRAX) 400 MG tablet Take 1 tablet (400 mg total) by mouth 2 (two) times daily. 180 tablet 3   Artificial Tear Solution  (SOOTHE XP) SOLN Place 1 drop into both eyes every evening.     aspirin 81 MG chewable tablet Chew 1 tablet (81 mg total) by mouth daily. (Patient taking differently: Chew 81 mg by mouth every evening.)     carvedilol (COREG) 12.5 MG tablet Take 12.5 mg by mouth 2 (two) times daily with a meal.     Cholecalciferol (VITAMIN D3) 2000 units TABS Take 2,000 Units by mouth daily.     cyanocobalamin (VITAMIN B12) 1000 MCG tablet Take 1 tablet (1,000 mcg total) by mouth daily. 30 tablet 3   dapagliflozin propanediol (FARXIGA) 10 MG TABS tablet Take 1 tablet (10 mg total) by mouth daily. 30 tablet 2   dexamethasone (DECADRON) 4 MG  tablet Take 5 tablets (20 mg total) by mouth once a week. Take 5 tablets in the morning on days when receiving the chemotherapy shot. 20 tablet 5   ferrous sulfate 325 (65 FE) MG tablet Take 325 mg by mouth daily with breakfast.     furosemide (LASIX) 20 MG tablet PLEASE SEE ATTACHED FOR DETAILED DIRECTIONS 90 tablet 1   levothyroxine (SYNTHROID, LEVOTHROID) 100 MCG tablet Take 100 mcg by mouth daily before breakfast.  2   losartan (COZAAR) 25 MG tablet Take 1 tablet (25 mg total) by mouth daily. 90 tablet 1   Multiple Vitamins-Minerals (PRESERVISION AREDS 2) CAPS Take 1 capsule by mouth 2 (two) times daily.     nitrofurantoin, macrocrystal-monohydrate, (MACROBID) 100 MG capsule Take 1 capsule (100 mg total) by mouth 2 (two) times daily. 5 capsule 0   ondansetron (ZOFRAN) 8 MG tablet Take 1 tablet (8 mg total) by mouth every 8 (eight) hours as needed. 30 tablet 0   potassium chloride (KLOR-CON M) 10 MEQ tablet Take 2 tablets (20 mEq total) by mouth daily as needed. Take with furosemide 180 tablet 0   pravastatin (PRAVACHOL) 40 MG tablet Take 40 mg by mouth every evening.     prochlorperazine (COMPAZINE) 10 MG tablet Take 1 tablet (10 mg total) by mouth every 6 (six) hours as needed for nausea or vomiting. 30 tablet 0   esomeprazole (NEXIUM) 20 MG capsule Take 20 mg by mouth daily.  (Patient not taking: Reported on 08/15/2022)     No current facility-administered medications for this visit.    REVIEW OF SYSTEMS:   Constitutional: ( - ) fevers, ( - )  chills , ( - ) night sweats Eyes: ( - ) blurriness of vision, ( - ) double vision, ( - ) watery eyes Ears, nose, mouth, throat, and face: ( - ) mucositis, ( - ) sore throat Respiratory: ( - ) cough, ( - ) dyspnea, ( - ) wheezes Cardiovascular: ( - ) palpitation, ( - ) chest discomfort, ( - ) lower extremity swelling Gastrointestinal:  ( - ) nausea, ( - ) heartburn, ( - ) change in bowel habits Skin: ( - ) abnormal skin rashes Lymphatics: ( - ) new lymphadenopathy, ( - ) easy bruising Neurological: ( - ) numbness, ( - ) tingling, ( - ) new weaknesses Behavioral/Psych: ( - ) mood change, ( - ) new changes  All other systems were reviewed with the patient and are negative.  PHYSICAL EXAMINATION: ECOG PERFORMANCE STATUS: 1 - Symptomatic but completely ambulatory  Vitals:   08/15/22 0921  BP: (!) 145/60  Pulse: 70  Resp: 17  Temp: 97.9 F (36.6 C)  SpO2: 99%   Filed Weights   08/15/22 0921  Weight: 161 lb 9.6 oz (73.3 kg)    GENERAL: well appearing elderly Caucasian female, alert, no distress and comfortable SKIN: skin color, texture, turgor are normal, no rashes or significant lesions EYES: conjunctiva are pink and non-injected, sclera clear LUNGS: clear to auscultation and percussion with normal breathing effort HEART: regular rate & rhythm and no murmurs and no lower extremity edema Musculoskeletal: no cyanosis of digits and no clubbing  PSYCH: alert & oriented x 3, fluent speech NEURO: no focal motor/sensory deficits  LABORATORY DATA:  I have reviewed the data as listed    Latest Ref Rng & Units 08/15/2022    8:59 AM 08/08/2022   11:15 AM 08/07/2022    7:50 AM  CBC  WBC 4.0 - 10.5 K/uL  6.5  17.3  5.3   Hemoglobin 12.0 - 15.0 g/dL 9.9  9.0  9.4   Hematocrit 36.0 - 46.0 % 30.0  27.1  29.3    Platelets 150 - 400 K/uL 186  200  202        Latest Ref Rng & Units 08/15/2022    8:59 AM 08/08/2022   11:15 AM 08/07/2022    7:50 AM  CMP  Glucose 70 - 99 mg/dL 113  104  164   BUN 8 - 23 mg/dL _0 Creatinine 0.44 - 1.00 mg/dL 0.82  0.88  0.93   Sodium 135 - 145 mmol/L 125  126  128   Potassium 3.5 - 5.1 mmol/L 4.0  3.9  3.6   Chloride 98 - 111 mmol/L 93  94  95   CO2 22 - 32 mmol/L _1 Calcium 8.9 - 10.3 mg/dL 8.4  8.2  8.0   Total Protein 6.5 - 8.1 g/dL 9.7  10.2  9.7   Total Bilirubin 0.3 - 1.2 mg/dL 0.3  0.3  0.3   Alkaline Phos 38 - 126 U/L 49  48  47   AST 15 - 41 U/L _2 ALT 0 - 44 U/L _3 Lab Results  Component Value Date   MPROTEIN 4.9 (H) 08/01/2022   MPROTEIN 4.9 (H) 06/14/2022   Lab Results  Component Value Date   KPAFRELGTCHN 6.3 08/01/2022   KPAFRELGTCHN 7.2 06/14/2022   LAMBDASER 9.7 08/01/2022   LAMBDASER 10.8 06/14/2022   KAPLAMBRATIO 0.65 08/01/2022   KAPLAMBRATIO 7.11 07/01/2022   KAPLAMBRATIO 0.67 06/14/2022    RADIOGRAPHIC STUDIES: No results found.  ASSESSMENT & PLAN [name] 88 y.o. female with medical history significant for newly diagnosed multiple myeloma who presents for a follow up visit.   After review of the labs, review the records, discussion with the patient the findings are most consistent with an IgG lambda multiple myeloma.  The patient meets the diagnostic criteria based on 60% plasma cells in the bone marrow and anemia.  Additionally the patient was noted to have vitamin B12 deficiency which is currently being treated as well.  Given her degree of anemia would recommend proceeding with VD chemotherapy and will add Revlimid once hemoglobin improves.  The patient and her daughter voiced understanding of the plan moving forward.  # IgG Lambda Multiple Myeloma  --diagnosis of MM confirmed with bone marrow biopsy showing 60% plasma cells and anemia -- Bone survey shows no lytic  lesions, kidney function is within normal limits.  --08/01/2022 was Cycle 1 Day 1 of Vd  Plan:  --Today is Cycle 1 Day 1 of Vd chemotherapy. Will add Revlimid when Hgb improves --Labs show white blood cell count 6.5, hemoglobin 9.9, MCV 83.6, and platelets of 186 --at each visit will collect CBC, CMP, and LDH with monthly restaging labs SPEP and SFLC.  --RTC in 2 weeks with interval weekly treatment.   # Normocytic Anemia #Vitamin B12 Deficiency --Anemia likely driven by multiple myeloma, but may be component of vitamin B12 deficiency as well. --initial labs showed elevated MMA with B12 180 -- Continue vitamin B12 1000 mcg p.o. daily -- Continue to monitor  #Supportive Care -- chemotherapy education complete -- port placement not required.  -- zofran 16m q8H PRN and compazine 156mPO q6H for nausea -- acyclovir 40074mO BID for  VCZ prophylaxis -- tylenol 1000 mg q8H PRN for back pain.   No orders of the defined types were placed in this encounter.   All questions were answered. The patient knows to call the clinic with any problems, questions or concerns.  A total of more than 30 minutes were spent on this encounter with face-to-face time and non-face-to-face time, including preparing to see the patient, ordering tests and/or medications, counseling the patient and coordination of care as outlined above.   Ledell Peoples, MD Department of Hematology/Oncology [facility] at [facility] Phone: [phone] Pager: [phone] Email: [email or web address]  08/15/2022 1:32 PM

## 2022-08-16 ENCOUNTER — Other Ambulatory Visit: Payer: Self-pay

## 2022-08-17 ENCOUNTER — Encounter (INDEPENDENT_AMBULATORY_CARE_PROVIDER_SITE_OTHER): Payer: Medicare Other | Admitting: Ophthalmology

## 2022-08-17 DIAGNOSIS — H43813 Vitreous degeneration, bilateral: Secondary | ICD-10-CM | POA: Diagnosis not present

## 2022-08-17 DIAGNOSIS — H35033 Hypertensive retinopathy, bilateral: Secondary | ICD-10-CM

## 2022-08-17 DIAGNOSIS — I10 Essential (primary) hypertension: Secondary | ICD-10-CM

## 2022-08-17 DIAGNOSIS — H353231 Exudative age-related macular degeneration, bilateral, with active choroidal neovascularization: Secondary | ICD-10-CM

## 2022-08-18 ENCOUNTER — Telehealth: Payer: Self-pay | Admitting: *Deleted

## 2022-08-18 ENCOUNTER — Other Ambulatory Visit: Payer: Self-pay | Admitting: Hematology and Oncology

## 2022-08-18 MED ORDER — TRAMADOL HCL 50 MG PO TABS: 50.0000 mg | ORAL_TABLET | Freq: Four times a day (QID) | ORAL | 0 refills | Status: DC | PRN

## 2022-08-18 NOTE — Telephone Encounter (Signed)
TCT patient regarding her back pain. Spoke with her.  Advised that Dr. Lorenso Courier has sent in a prescription for Tramadol 50 mg to see if that will help her back pain until he sees her on Monday. Advised that it could make her a bit sleepy so she should not drive when she takes this medication until she knows how it makes her feel. Advised that Dr. Lorenso Courier will see her in the infusion room on Monday. Pt voiced understanding to all of the above.

## 2022-08-18 NOTE — Telephone Encounter (Signed)
Received vm message from pt. She states her back pain is not getting any better. It is getting worse. The Tylenol and heating pad are not helping. Pt states getting in and out of bed causes excruciating pain.  Please advise.

## 2022-08-21 ENCOUNTER — Inpatient Hospital Stay: Payer: Medicare Other

## 2022-08-21 ENCOUNTER — Other Ambulatory Visit: Payer: Self-pay | Admitting: Hematology and Oncology

## 2022-08-21 ENCOUNTER — Other Ambulatory Visit: Payer: Self-pay

## 2022-08-21 ENCOUNTER — Ambulatory Visit (HOSPITAL_COMMUNITY)
Admission: RE | Admit: 2022-08-21 | Discharge: 2022-08-21 | Disposition: A | Discharge status: Home / Self Care | Payer: Medicare Other | Source: Ambulatory Visit | Attending: Hematology and Oncology | Admitting: Hematology and Oncology

## 2022-08-21 VITALS — BP 121/73 | HR 67 | Temp 97.8°F | Resp 18 | Ht 62.0 in | Wt 162.0 lb

## 2022-08-21 DIAGNOSIS — E871 Hypo-osmolality and hyponatremia: Secondary | ICD-10-CM | POA: Diagnosis not present

## 2022-08-21 DIAGNOSIS — I5033 Acute on chronic diastolic (congestive) heart failure: Secondary | ICD-10-CM | POA: Diagnosis not present

## 2022-08-21 DIAGNOSIS — C9000 Multiple myeloma not having achieved remission: Secondary | ICD-10-CM

## 2022-08-21 DIAGNOSIS — R34 Anuria and oliguria: Secondary | ICD-10-CM | POA: Diagnosis not present

## 2022-08-21 DIAGNOSIS — E538 Deficiency of other specified B group vitamins: Secondary | ICD-10-CM | POA: Diagnosis not present

## 2022-08-21 DIAGNOSIS — D649 Anemia, unspecified: Secondary | ICD-10-CM | POA: Diagnosis not present

## 2022-08-21 DIAGNOSIS — M546 Pain in thoracic spine: Secondary | ICD-10-CM | POA: Insufficient documentation

## 2022-08-21 DIAGNOSIS — D72829 Elevated white blood cell count, unspecified: Secondary | ICD-10-CM | POA: Diagnosis not present

## 2022-08-21 DIAGNOSIS — M4316 Spondylolisthesis, lumbar region: Secondary | ICD-10-CM | POA: Diagnosis not present

## 2022-08-21 DIAGNOSIS — Z5112 Encounter for antineoplastic immunotherapy: Secondary | ICD-10-CM | POA: Diagnosis not present

## 2022-08-21 DIAGNOSIS — M545 Low back pain, unspecified: Secondary | ICD-10-CM | POA: Diagnosis not present

## 2022-08-21 DIAGNOSIS — I110 Hypertensive heart disease with heart failure: Secondary | ICD-10-CM | POA: Diagnosis not present

## 2022-08-21 LAB — CMP (CANCER CENTER ONLY)
ALT: 10 U/L (ref 0–44)
AST: 17 U/L (ref 15–41)
Albumin: 2.7 g/dL — ABNORMAL LOW (ref 3.5–5.0)
Alkaline Phosphatase: 55 U/L (ref 38–126)
Anion gap: 6 (ref 5–15)
BUN: 25 mg/dL — ABNORMAL HIGH (ref 8–23)
CO2: 27 mmol/L (ref 22–32)
Calcium: 8.3 mg/dL — ABNORMAL LOW (ref 8.9–10.3)
Chloride: 92 mmol/L — ABNORMAL LOW (ref 98–111)
Creatinine: 0.93 mg/dL (ref 0.44–1.00)
GFR, Estimated: 60 mL/min — ABNORMAL LOW (ref >60)
Glucose, Bld: 104 mg/dL — ABNORMAL HIGH (ref 70–99)
Potassium: 4.3 mmol/L (ref 3.5–5.1)
Sodium: 125 mmol/L — ABNORMAL LOW (ref 135–145)
Total Bilirubin: 0.4 mg/dL (ref 0.3–1.2)
Total Protein: 9.3 g/dL — ABNORMAL HIGH (ref 6.5–8.1)

## 2022-08-21 LAB — CBC WITH DIFFERENTIAL (CANCER CENTER ONLY)
Abs Immature Granulocytes: 0.06 10*3/uL (ref 0.00–0.07)
Basophils Absolute: 0 10*3/uL (ref 0.0–0.1)
Basophils Relative: 0 %
Eosinophils Absolute: 0 10*3/uL (ref 0.0–0.5)
Eosinophils Relative: 1 %
HCT: 30.1 % — ABNORMAL LOW (ref 36.0–46.0)
Hemoglobin: 10 g/dL — ABNORMAL LOW (ref 12.0–15.0)
Immature Granulocytes: 1 %
Lymphocytes Relative: 12 %
Lymphs Abs: 0.7 10*3/uL (ref 0.7–4.0)
MCH: 27.7 pg (ref 26.0–34.0)
MCHC: 33.2 g/dL (ref 30.0–36.0)
MCV: 83.4 fL (ref 80.0–100.0)
Monocytes Absolute: 0.5 10*3/uL (ref 0.1–1.0)
Monocytes Relative: 8 %
Neutro Abs: 4.8 10*3/uL (ref 1.7–7.7)
Neutrophils Relative %: 78 %
Platelet Count: 174 10*3/uL (ref 150–400)
RBC: 3.61 MIL/uL — ABNORMAL LOW (ref 3.87–5.11)
RDW: 19.9 % — ABNORMAL HIGH (ref 11.5–15.5)
WBC Count: 6.2 10*3/uL (ref 4.0–10.5)
nRBC: 0 % (ref 0.0–0.2)

## 2022-08-21 MED ORDER — BORTEZOMIB CHEMO SQ INJECTION 3.5 MG (2.5MG/ML)
1.3000 mg/m2 | Freq: Once | INTRAMUSCULAR | Status: AC
  Administered 2022-08-21: 2.25 mg via SUBCUTANEOUS
  Filled 2022-08-21: qty 0.9

## 2022-08-21 NOTE — Progress Notes (Signed)
Pt reported to infusion today with worsening back pain. Pt informed this RN that the tramadol Dr. Lorenso Courier prescribed has not helped her pain and she is "tired of taking all that medicine". The Pt mentioned that she might need an XR on her back.  This RN made Dr.Dorsey aware.  MD assessed Pt chairside and recommended the Pt take 100 mg of Tramadol every six hours as needed for her back pain. MD also ordered XR of Pt's back to be completed after infusion appt today.  This RN made Pt aware of ordered XR and Pt agreeable to changes. Pt verbalized understanding of how to take tramadol and was informed to notify MD if pain is not relieved with new pain medication dose.

## 2022-08-21 NOTE — Patient Instructions (Signed)
[facility] ONCOLOGY  Discharge Instructions: Thank you for choosing [facility] to provide your oncology and hematology care.   If you have a lab appointment with the [facility], please go directly to the [facility] and check in at the registration area.   Wear comfortable clothing and clothing appropriate for easy access to any Portacath or PICC line.   We strive to give you quality time with your provider. You may need to reschedule your appointment if you arrive late (15 or more minutes).  Arriving late affects you and other patients whose appointments are after yours.  Also, if you miss three or more appointments without notifying the office, you may be dismissed from the clinic at the provider's discretion.      For prescription refill requests, have your pharmacy contact our office and allow 72 hours for refills to be completed.    Today you received the following chemotherapy and/or immunotherapy agents: Velcade      To help prevent nausea and vomiting after your treatment, we encourage you to take your nausea medication as directed.  BELOW ARE SYMPTOMS THAT SHOULD BE REPORTED IMMEDIATELY: *FEVER GREATER THAN 100.4 F (38 C) OR HIGHER *CHILLS OR SWEATING *NAUSEA AND VOMITING THAT IS NOT CONTROLLED WITH YOUR NAUSEA MEDICATION *UNUSUAL SHORTNESS OF BREATH *UNUSUAL BRUISING OR BLEEDING *URINARY PROBLEMS (pain or burning when urinating, or frequent urination) *BOWEL PROBLEMS (unusual diarrhea, constipation, pain near the anus) TENDERNESS IN MOUTH AND THROAT WITH OR WITHOUT PRESENCE OF ULCERS (sore throat, sores in mouth, or a toothache) UNUSUAL RASH, SWELLING OR PAIN  UNUSUAL VAGINAL DISCHARGE OR ITCHING   Items with * indicate a potential emergency and should be followed up as soon as possible or go to the Emergency Department if any problems should occur.  Please show the CHEMOTHERAPY ALERT CARD or IMMUNOTHERAPY ALERT CARD at check-in to  the Emergency Department and triage nurse.  Should you have questions after your visit or need to cancel or reschedule your appointment, please contact [facility]  Dept: [phone]  and follow the prompts.  Office hours are 8:00 a.m. to 4:30 p.m. Monday - Friday. Please note that voicemails left after 4:00 p.m. may not be returned until the following business day.  We are closed weekends and major holidays. You have access to a nurse at all times for urgent questions. Please call the main number to the clinic Dept: [phone] and follow the prompts.   For any non-urgent questions, you may also contact your provider using MyChart. We now offer e-Visits for anyone 68 and older to request care online for non-urgent symptoms. For details visit [email or web address].   Also download the MyChart app! Go to the app store, search "MyChart", open the app, select [facility], and log in with your MyChart username and password.  Masks are optional in the cancer centers. If you would like for your care team to wear a mask while they are taking care of you, please let them know. You may have one support person who is at least 86 years old accompany you for your appointments.

## 2022-08-22 ENCOUNTER — Telehealth: Payer: Self-pay | Admitting: *Deleted

## 2022-08-22 NOTE — Telephone Encounter (Signed)
TCT patient regarding her spine xrays that were done @ [facility] yesterday. Results not available yet-waiting for the radiologist to read them officially.  Advised that Iwould call back as soon as the results are available. Pt voiced understanding. She states the tramadol is not working well.

## 2022-08-23 ENCOUNTER — Telehealth: Payer: Self-pay | Admitting: *Deleted

## 2022-08-23 NOTE — Telephone Encounter (Signed)
Received call back from pt. Spoke to her about her thoracxic and lumbar spine xrays. Advised there was no evidence of myeloma in her back. Dr. Lorenso Courier recommends she contact her orthopedist to address her back pain. She is agreeable to this.

## 2022-08-23 NOTE — Telephone Encounter (Signed)
TCT patient regarding recent spine xrays. No answer and not vm available.  Will try later in the day.

## 2022-08-24 ENCOUNTER — Other Ambulatory Visit: Payer: Self-pay | Admitting: Physician Assistant

## 2022-08-24 ENCOUNTER — Other Ambulatory Visit: Payer: Self-pay

## 2022-08-25 DIAGNOSIS — M4854XA Collapsed vertebra, not elsewhere classified, thoracic region, initial encounter for fracture: Secondary | ICD-10-CM | POA: Diagnosis not present

## 2022-08-25 DIAGNOSIS — M546 Pain in thoracic spine: Secondary | ICD-10-CM | POA: Diagnosis not present

## 2022-08-25 DIAGNOSIS — Z8579 Personal history of other malignant neoplasms of lymphoid, hematopoietic and related tissues: Secondary | ICD-10-CM | POA: Diagnosis not present

## 2022-08-28 ENCOUNTER — Inpatient Hospital Stay: Payer: Medicare Other | Admitting: Hematology and Oncology

## 2022-08-28 ENCOUNTER — Inpatient Hospital Stay: Payer: Medicare Other | Attending: Physician Assistant

## 2022-08-28 ENCOUNTER — Inpatient Hospital Stay: Payer: Medicare Other

## 2022-08-28 ENCOUNTER — Other Ambulatory Visit: Payer: Self-pay | Admitting: Hematology and Oncology

## 2022-08-28 VITALS — BP 114/59 | HR 73 | Temp 98.8°F | Resp 16 | Ht 62.0 in | Wt 160.1 lb

## 2022-08-28 DIAGNOSIS — D649 Anemia, unspecified: Secondary | ICD-10-CM | POA: Insufficient documentation

## 2022-08-28 DIAGNOSIS — C9000 Multiple myeloma not having achieved remission: Secondary | ICD-10-CM | POA: Diagnosis not present

## 2022-08-28 DIAGNOSIS — E538 Deficiency of other specified B group vitamins: Secondary | ICD-10-CM | POA: Diagnosis not present

## 2022-08-28 DIAGNOSIS — M546 Pain in thoracic spine: Secondary | ICD-10-CM | POA: Diagnosis not present

## 2022-08-28 DIAGNOSIS — Z5112 Encounter for antineoplastic immunotherapy: Secondary | ICD-10-CM | POA: Insufficient documentation

## 2022-08-28 LAB — CMP (CANCER CENTER ONLY)
ALT: 13 U/L (ref 0–44)
AST: 20 U/L (ref 15–41)
Albumin: 2.9 g/dL — ABNORMAL LOW (ref 3.5–5.0)
Alkaline Phosphatase: 66 U/L (ref 38–126)
Anion gap: 6 (ref 5–15)
BUN: 23 mg/dL (ref 8–23)
CO2: 28 mmol/L (ref 22–32)
Calcium: 8.4 mg/dL — ABNORMAL LOW (ref 8.9–10.3)
Chloride: 94 mmol/L — ABNORMAL LOW (ref 98–111)
Creatinine: 0.84 mg/dL (ref 0.44–1.00)
GFR, Estimated: >60 mL/min (ref >60)
Glucose, Bld: 118 mg/dL — ABNORMAL HIGH (ref 70–99)
Potassium: 4.2 mmol/L (ref 3.5–5.1)
Sodium: 128 mmol/L — ABNORMAL LOW (ref 135–145)
Total Bilirubin: 0.4 mg/dL (ref 0.3–1.2)
Total Protein: 8.7 g/dL — ABNORMAL HIGH (ref 6.5–8.1)

## 2022-08-28 LAB — CBC WITH DIFFERENTIAL (CANCER CENTER ONLY)
Abs Immature Granulocytes: 0.04 10*3/uL (ref 0.00–0.07)
Basophils Absolute: 0 10*3/uL (ref 0.0–0.1)
Basophils Relative: 0 %
Eosinophils Absolute: 0 10*3/uL (ref 0.0–0.5)
Eosinophils Relative: 0 %
HCT: 30.4 % — ABNORMAL LOW (ref 36.0–46.0)
Hemoglobin: 10 g/dL — ABNORMAL LOW (ref 12.0–15.0)
Immature Granulocytes: 1 %
Lymphocytes Relative: 6 %
Lymphs Abs: 0.5 10*3/uL — ABNORMAL LOW (ref 0.7–4.0)
MCH: 27.7 pg (ref 26.0–34.0)
MCHC: 32.9 g/dL (ref 30.0–36.0)
MCV: 84.2 fL (ref 80.0–100.0)
Monocytes Absolute: 0.6 10*3/uL (ref 0.1–1.0)
Monocytes Relative: 8 %
Neutro Abs: 6.3 10*3/uL (ref 1.7–7.7)
Neutrophils Relative %: 85 %
Platelet Count: 173 10*3/uL (ref 150–400)
RBC: 3.61 MIL/uL — ABNORMAL LOW (ref 3.87–5.11)
RDW: 20.4 % — ABNORMAL HIGH (ref 11.5–15.5)
WBC Count: 7.4 10*3/uL (ref 4.0–10.5)
nRBC: 0.4 % — ABNORMAL HIGH (ref 0.0–0.2)

## 2022-08-28 MED ORDER — BORTEZOMIB CHEMO SQ INJECTION 3.5 MG (2.5MG/ML)
1.3000 mg/m2 | Freq: Once | INTRAMUSCULAR | Status: AC
  Administered 2022-08-28: 2.25 mg via SUBCUTANEOUS
  Filled 2022-08-28: qty 0.9

## 2022-08-28 NOTE — Patient Instructions (Signed)
[facility] ONCOLOGY  Discharge Instructions: Thank you for choosing [facility] to provide your oncology and hematology care.   If you have a lab appointment with the [facility], please go directly to the [facility] and check in at the registration area.   Wear comfortable clothing and clothing appropriate for easy access to any Portacath or PICC line.   We strive to give you quality time with your provider. You may need to reschedule your appointment if you arrive late (15 or more minutes).  Arriving late affects you and other patients whose appointments are after yours.  Also, if you miss three or more appointments without notifying the office, you may be dismissed from the clinic at the provider's discretion.      For prescription refill requests, have your pharmacy contact our office and allow 72 hours for refills to be completed.    Today you received the following chemotherapy and/or immunotherapy agents: Velcade      To help prevent nausea and vomiting after your treatment, we encourage you to take your nausea medication as directed.  BELOW ARE SYMPTOMS THAT SHOULD BE REPORTED IMMEDIATELY: *FEVER GREATER THAN 100.4 F (38 C) OR HIGHER *CHILLS OR SWEATING *NAUSEA AND VOMITING THAT IS NOT CONTROLLED WITH YOUR NAUSEA MEDICATION *UNUSUAL SHORTNESS OF BREATH *UNUSUAL BRUISING OR BLEEDING *URINARY PROBLEMS (pain or burning when urinating, or frequent urination) *BOWEL PROBLEMS (unusual diarrhea, constipation, pain near the anus) TENDERNESS IN MOUTH AND THROAT WITH OR WITHOUT PRESENCE OF ULCERS (sore throat, sores in mouth, or a toothache) UNUSUAL RASH, SWELLING OR PAIN  UNUSUAL VAGINAL DISCHARGE OR ITCHING   Items with * indicate a potential emergency and should be followed up as soon as possible or go to the Emergency Department if any problems should occur.  Please show the CHEMOTHERAPY ALERT CARD or IMMUNOTHERAPY ALERT CARD at check-in to  the Emergency Department and triage nurse.  Should you have questions after your visit or need to cancel or reschedule your appointment, please contact [facility]  Dept: [phone]  and follow the prompts.  Office hours are 8:00 a.m. to 4:30 p.m. Monday - Friday. Please note that voicemails left after 4:00 p.m. may not be returned until the following business day.  We are closed weekends and major holidays. You have access to a nurse at all times for urgent questions. Please call the main number to the clinic Dept: [phone] and follow the prompts.   For any non-urgent questions, you may also contact your provider using MyChart. We now offer e-Visits for anyone 70 and older to request care online for non-urgent symptoms. For details visit [email or web address].   Also download the MyChart app! Go to the app store, search "MyChart", open the app, select [facility], and log in with your MyChart username and password.  Masks are optional in the cancer centers. If you would like for your care team to wear a mask while they are taking care of you, please let them know. You may have one support person who is at least 86 years old accompany you for your appointments.

## 2022-08-28 NOTE — Progress Notes (Signed)
[facility] [phone]   [phone]  PROGRESS NOTE  Patient Care Team: [facility] as PCP - General (Rheumatology)  Hematological/Oncological History # IgG Lambda Multiple Myeloma  06/14/2022: establish care with [name] due to anemia. Labs showed M protein 4.9, Kappa 7.2, Lambda 10.8, ratio 0.67 07/13/2022: Bmbx showed Lambda restricted plasma cell neoplasm involving approximately 60% of the cellular marrow by IHC on the biopsy.  08/01/2022: Cycle 1 Day 1 of VRd chemotherapy. (Holding revlimid initially) 08/21/2022:  Cycle 2 Day 1 of VRd chemotherapy. (Holding revlimid)  Interval History:  [name] 88 y.o. female with medical history significant for newly diagnosed multiple myeloma who presents for a follow up visit. The patient's last visit was on 08/01/2022. In the interim since the last visit she started Vd chemotherapy.   On exam today Mrs. Fischman reports her back pain has persisted.  She is currently taking hydrocodone 5 mg and is completely off the tramadol.  She tried doubling the dose to 100 mg of tramadol but it did not touch her pain.  She notes that she will be seeing a spine doctor Dr. Rolena Infante at 3 PM this afternoon for further evaluation.  She reports that she still takes about 3 of the hydrocodone per day and is using a heating pad to try to help with this.  She was also recently given a nasal spray to "help with osteoporosis".  She notes that she is using this every other day.  She notes that she does struggle with some constipation and has been trying MiraLAX but was just recommended Senokot by my nurse.  She notes that she had a "partial bowel movement" this morning.  She is not having any nausea or vomiting.  She does have some occasional numbness in her fingers but otherwise is tolerating her Velcade shots quite well.  She denies any fevers, chills, sweats, nausea, vomiting, diarrhea.  Full 10 point ROS was  otherwise negative.  She is willing and able to proceed with treatment at this time.   MEDICAL HISTORY:  Past Medical History:  Diagnosis Date   Arthritis    some - per patient   Breast cancer ([place])    breast cancer / left    Cataract    bilat    GERD (gastroesophageal reflux disease)    History of kidney stones    Hyperlipidemia    Hypertension    Hypothyroidism    Macular degeneration    Left   S/P TAVR (transcatheter aortic valve replacement) 09/03/2018   23 mm Edwards Sapien 3 transcatheter heart valve placed via percutaneous right transfemoral approach    Severe aortic stenosis    Stress incontinence    Thyroid disease    Tinnitus     SURGICAL HISTORY: Past Surgical History:  Procedure Laterality Date   ABDOMINAL HYSTERECTOMY  1970's   BACK SURGERY     BREAST LUMPECTOMY  12/1998   lumpectomy   CARDIAC CATHETERIZATION     EYE SURGERY     cataract surgery bilat    INTRAOPERATIVE TRANSTHORACIC ECHOCARDIOGRAM N/A 09/03/2018   Procedure: INTRAOPERATIVE TRANSTHORACIC ECHOCARDIOGRAM;  Surgeon: Burnell Blanks, MD;  Location: [facility];  Service: Open Heart Surgery;  Laterality: N/A;   LITHOTRIPSY     Right total knee     2018 Dr. Alvan Dame   RIGHT/LEFT HEART CATH AND CORONARY ANGIOGRAPHY N/A 08/06/2018   Procedure: RIGHT/LEFT HEART CATH AND CORONARY ANGIOGRAPHY;  Surgeon: Adrian Prows, MD;  Location[facility]  INVASIVE CV LAB;  Service: Cardiovascular;  Laterality: N/A;   THYROIDECTOMY, PARTIAL  1975   TONSILLECTOMY     as a child - patient not sure of exact date   TOTAL KNEE ARTHROPLASTY Left 03/13/2016   Procedure: TOTAL KNEE ARTHROPLASTY;  Surgeon: Paralee Cancel, MD;  Location: [facility];  Service: Orthopedics;  Laterality: Left;   TOTAL KNEE ARTHROPLASTY Right 06/18/2017   Procedure: RIGHT TOTAL KNEE ARTHROPLASTY;  Surgeon: Paralee Cancel, MD;  Location: [facility];  Service: Orthopedics;  Laterality: Right;   TRANSCATHETER AORTIC VALVE REPLACEMENT, TRANSFEMORAL N/A 09/03/2018    Procedure: TRANSCATHETER AORTIC VALVE REPLACEMENT, TRANSFEMORAL;  Surgeon: Burnell Blanks, MD;  Location: [facility];  Service: Open Heart Surgery;  Laterality: N/A;    SOCIAL HISTORY: Social History   Socioeconomic History   Marital status: Widowed    Spouse name: Not on file   Number of children: 4   Years of education: Not on file   Highest education level: Master's degree (e.g., MA, MS, MEng, MEd, MSW, MBA)  Occupational History   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  Tobacco Use   Smoking status: Never   Smokeless tobacco: Never  Vaping Use   Vaping Use: Never used  Substance and Sexual Activity   Alcohol use: No   Drug use: No   Sexual activity: Not Currently  Other Topics Concern   Not on file  Social History Narrative   Not on file   Social Determinants of Health   Financial Resource Strain: Low Risk  (11/12/2018)   Overall Financial Resource Strain (CARDIA)    Difficulty of Paying Living Expenses: Not very hard  Food Insecurity: No Food Insecurity (11/12/2018)   Hunger Vital Sign    Worried About Running Out of Food in the Last Year: Never true    Ran Out of Food in the Last Year: Never true  Transportation Needs: No Transportation Needs (11/12/2018)   PRAPARE - Hydrologist (Medical): No    Lack of Transportation (Non-Medical): No  Physical Activity: Inactive (11/12/2018)   Exercise Vital Sign    Days of Exercise per Week: 0 days    Minutes of Exercise per Session: 0 min  Stress: Stress Concern Present (11/12/2018)   [place]    Feeling of Stress : To some extent  Social Connections: Not on file  Intimate Partner Violence: Not on file    FAMILY HISTORY: Family History  Problem Relation Age of Onset   Diabetes Mother    Stroke Mother        Carotid artery disease   Heart disease Father        CAD   Coronary artery disease  Father    Diabetes Sister     ALLERGIES:  is allergic to penicillins and sulfa antibiotics.  MEDICATIONS:  Current Outpatient Medications  Medication Sig Dispense Refill   calcitonin, salmon, (MIACALCIN/FORTICAL) 200 UNIT/ACT nasal spray Place 1 spray into alternate nostrils daily.     HYDROcodone-acetaminophen (NORCO/VICODIN) 5-325 MG tablet Take 1 tablet by mouth 3 (three) times daily.     acyclovir (ZOVIRAX) 400 MG tablet Take 1 tablet (400 mg total) by mouth 2 (two) times daily. 180 tablet 3   Artificial Tear Solution (SOOTHE XP) SOLN Place 1 drop into both eyes every evening.     aspirin 81 MG chewable tablet Chew 1 tablet (81 mg total) by mouth daily. (Patient taking differently: Chew 81  mg by mouth every evening.)     carvedilol (COREG) 12.5 MG tablet Take 12.5 mg by mouth 2 (two) times daily with a meal.     Cholecalciferol (VITAMIN D3) 2000 units TABS Take 2,000 Units by mouth daily.     [employer] VITAMIN B12 1000 MCG tablet TAKE 1 TABLET BY MOUTH EVERY DAY 90 tablet 1   dapagliflozin propanediol (FARXIGA) 10 MG TABS tablet Take 1 tablet (10 mg total) by mouth daily. 30 tablet 2   dexamethasone (DECADRON) 4 MG tablet Take 5 tablets (20 mg total) by mouth once a week. Take 5 tablets in the morning on days when receiving the chemotherapy shot. 20 tablet 5   esomeprazole (NEXIUM) 20 MG capsule Take 20 mg by mouth daily. (Patient not taking: Reported on 08/15/2022)     ferrous sulfate 325 (65 FE) MG tablet Take 325 mg by mouth daily with breakfast.     furosemide (LASIX) 20 MG tablet PLEASE SEE ATTACHED FOR DETAILED DIRECTIONS 90 tablet 1   levothyroxine (SYNTHROID, LEVOTHROID) 100 MCG tablet Take 100 mcg by mouth daily before breakfast.  2   losartan (COZAAR) 25 MG tablet Take 1 tablet (25 mg total) by mouth daily. 90 tablet 1   Multiple Vitamins-Minerals (PRESERVISION AREDS 2) CAPS Take 1 capsule by mouth 2 (two) times daily.     nitrofurantoin, macrocrystal-monohydrate, (MACROBID) 100 MG  capsule Take 1 capsule (100 mg total) by mouth 2 (two) times daily. 5 capsule 0   nystatin cream (MYCOSTATIN) Apply 1 Application topically 2 (two) times daily.     ondansetron (ZOFRAN) 8 MG tablet Take 1 tablet (8 mg total) by mouth every 8 (eight) hours as needed. 30 tablet 0   potassium chloride (KLOR-CON M) 10 MEQ tablet Take 2 tablets (20 mEq total) by mouth daily as needed. Take with furosemide 180 tablet 0   pravastatin (PRAVACHOL) 40 MG tablet Take 40 mg by mouth every evening.     prochlorperazine (COMPAZINE) 10 MG tablet Take 1 tablet (10 mg total) by mouth every 6 (six) hours as needed for nausea or vomiting. 30 tablet 0   traMADol (ULTRAM) 50 MG tablet Take 1 tablet (50 mg total) by mouth every 6 (six) hours as needed. (Patient not taking: Reported on 08/28/2022) 30 tablet 0   No current facility-administered medications for this visit.    REVIEW OF SYSTEMS:   Constitutional: ( - ) fevers, ( - )  chills , ( - ) night sweats Eyes: ( - ) blurriness of vision, ( - ) double vision, ( - ) watery eyes Ears, nose, mouth, throat, and face: ( - ) mucositis, ( - ) sore throat Respiratory: ( - ) cough, ( - ) dyspnea, ( - ) wheezes Cardiovascular: ( - ) palpitation, ( - ) chest discomfort, ( - ) lower extremity swelling Gastrointestinal:  ( - ) nausea, ( - ) heartburn, ( - ) change in bowel habits Skin: ( - ) abnormal skin rashes Lymphatics: ( - ) new lymphadenopathy, ( - ) easy bruising Neurological: ( - ) numbness, ( - ) tingling, ( - ) new weaknesses Behavioral/Psych: ( - ) mood change, ( - ) new changes  All other systems were reviewed with the patient and are negative.  PHYSICAL EXAMINATION: ECOG PERFORMANCE STATUS: 1 - Symptomatic but completely ambulatory  Vitals:   08/28/22 0949  BP: (!) 114/59  Pulse: 73  Resp: 16  Temp: 98.8 F (37.1 C)  SpO2: 96%    Filed Weights  08/28/22 0949  Weight: 160 lb 1.6 oz (72.6 kg)     GENERAL: well appearing elderly Caucasian female,  alert, no distress and comfortable SKIN: skin color, texture, turgor are normal, no rashes or significant lesions EYES: conjunctiva are pink and non-injected, sclera clear LUNGS: clear to auscultation and percussion with normal breathing effort HEART: regular rate & rhythm and no murmurs and no lower extremity edema Musculoskeletal: no cyanosis of digits and no clubbing  PSYCH: alert & oriented x 3, fluent speech NEURO: no focal motor/sensory deficits  LABORATORY DATA:  I have reviewed the data as listed    Latest Ref Rng & Units 08/28/2022    9:25 AM 08/21/2022    8:19 AM 08/15/2022    8:59 AM  CBC  WBC 4.0 - 10.5 K/uL 7.4  6.2  6.5   Hemoglobin 12.0 - 15.0 g/dL 10.0  10.0  9.9   Hematocrit 36.0 - 46.0 % 30.4  30.1  30.0   Platelets 150 - 400 K/uL 173  174  186        Latest Ref Rng & Units 08/28/2022    9:25 AM 08/21/2022    8:19 AM 08/15/2022    8:59 AM  CMP  Glucose 70 - 99 mg/dL 118  104  113   BUN 8 - 23 mg/dL _0 Creatinine 0.44 - 1.00 mg/dL 0.84  0.93  0.82   Sodium 135 - 145 mmol/L 128  125  125   Potassium 3.5 - 5.1 mmol/L 4.2  4.3  4.0   Chloride 98 - 111 mmol/L 94  92  93   CO2 22 - 32 mmol/L _1 Calcium 8.9 - 10.3 mg/dL 8.4  8.3  8.4   Total Protein 6.5 - 8.1 g/dL 8.7  9.3  9.7   Total Bilirubin 0.3 - 1.2 mg/dL 0.4  0.4  0.3   Alkaline Phos 38 - 126 U/L 66  55  49   AST 15 - 41 U/L _2 ALT 0 - 44 U/L _3 Lab Results  Component Value Date   MPROTEIN 4.9 (H) 08/01/2022   MPROTEIN 4.9 (H) 06/14/2022   Lab Results  Component Value Date   KPAFRELGTCHN 6.3 08/01/2022   KPAFRELGTCHN 7.2 06/14/2022   LAMBDASER 9.7 08/01/2022   LAMBDASER 10.8 06/14/2022   KAPLAMBRATIO 0.65 08/01/2022   KAPLAMBRATIO 7.11 07/01/2022   KAPLAMBRATIO 0.67 06/14/2022    RADIOGRAPHIC STUDIES: DG Thoracic Spine 2 View  Result Date: 08/23/2022 CLINICAL DATA:  Back pain, multiple myeloma EXAM: THORACIC SPINE 2 VIEWS COMPARISON:  06/23/2022  FINDINGS: Frontal and lateral views of the thoracic spine are obtained. Alignment is anatomic. Chronic T12 compression deformity with prior vertebral augmentation, stable. There is minimal anterior wedging of the T8 vertebral body that has developed in the interim since the prior bone survey, with less than 10% loss of height anteriorly. No other acute bony abnormalities. Paraspinal soft tissues are unremarkable. Aortic valve prosthesis again noted. IMPRESSION: 1. Mild anterior wedging of the T8 vertebral body since prior exam, which could reflect developing anterior compression deformity. Less than 10% loss of height and no retropulsion. 2. No other acute displaced fractures. 3. Chronic T12 compression deformity and prior vertebral augmentation. Electronically Signed   By: Randa Ngo M.D.   On: 08/23/2022 13:41   DG Lumbar Spine 2-3 Views  Result Date: 08/23/2022 CLINICAL DATA:  Multiple myeloma, back pain EXAM: LUMBAR SPINE - 2-3 VIEW COMPARISON:  12/26/2019 FINDINGS: Frontal and lateral views of the lumbar spine are obtained on 3 images. There are 5 non-rib-bearing lumbar type vertebral bodies identified. Grade 1 anterolisthesis of L4 on L5 is unchanged. There is mild degenerative retrolisthesis of L1 on L2 and L2 on L3, also stable. There are no acute displaced fractures. Multilevel spondylosis and facet hypertrophy throughout the lumbar spine again noted and unchanged, greatest at L1-2, L2-3, and L5-S1. Prior compression deformity and vertebral augmentation at T12. Stable atherosclerosis. IMPRESSION: 1. No acute bony abnormality. 2. Stable multilevel spondylosis and facet hypertrophy. Electronically Signed   By: Randa Ngo M.D.   On: 08/23/2022 13:39    ASSESSMENT & PLAN [name] 88 y.o. female with medical history significant for newly diagnosed multiple myeloma who presents for a follow up visit.   After review of the labs, review the records, discussion with the patient the  findings are most consistent with an IgG lambda multiple myeloma.  The patient meets the diagnostic criteria based on 60% plasma cells in the bone marrow and anemia.  Additionally the patient was noted to have vitamin B12 deficiency which is currently being treated as well.  Given her degree of anemia would recommend proceeding with VD chemotherapy and will add Revlimid once hemoglobin improves.  The patient and her daughter voiced understanding of the plan moving forward.  # IgG Lambda Multiple Myeloma  --diagnosis of MM confirmed with bone marrow biopsy showing 60% plasma cells and anemia -- Bone survey shows no lytic lesions, kidney function is within normal limits.  --08/01/2022 was Cycle 1 Day 1 of Vd  Plan:  --Today is Cycle 2 Day 8 of Vd chemotherapy. Will add Revlimid when Hgb improves --Labs show white blood cell count 9.4, hemoglobin 10.0, MCV 84.2, and platelets of 173.  Distantly creatinine is 0.84 with LFTs within normal limits. --at each visit will collect CBC, CMP, and LDH with monthly restaging labs SPEP and SFLC.  --RTC in 2 weeks with interval weekly treatment.   # Normocytic Anemia #Vitamin B12 Deficiency --Anemia likely driven by multiple myeloma, but may be component of vitamin B12 deficiency as well. --initial labs showed elevated MMA with B12 180 -- Continue vitamin B12 1000 mcg p.o. daily -- Continue to monitor  #Supportive Care -- chemotherapy education complete -- port placement not required.  -- zofran 12m q8H PRN and compazine 175mPO q6H for nausea -- acyclovir 40091mO BID for VCZ prophylaxis -- tylenol 1000 mg q8H PRN for back pain.   No orders of the defined types were placed in this encounter.   All questions were answered. The patient knows to call the clinic with any problems, questions or concerns.  A total of more than 30 minutes were spent on this encounter with face-to-face time and non-face-to-face time, including preparing to see the patient,  ordering tests and/or medications, counseling the patient and coordination of care as outlined above.   JohLedell PeoplesD Department of Hematology/Oncology [facility]: [phone]: [phone]: [email or web address]  08/28/2022 1:55 PM

## 2022-08-29 ENCOUNTER — Other Ambulatory Visit: Payer: Self-pay

## 2022-08-29 DIAGNOSIS — M546 Pain in thoracic spine: Secondary | ICD-10-CM | POA: Diagnosis not present

## 2022-08-29 DIAGNOSIS — M4854XA Collapsed vertebra, not elsewhere classified, thoracic region, initial encounter for fracture: Secondary | ICD-10-CM | POA: Diagnosis not present

## 2022-08-29 LAB — KAPPA/LAMBDA LIGHT CHAINS
Kappa free light chain: 4.3 mg/L (ref 3.3–19.4)
Kappa, lambda light chain ratio: 0.69 (ref 0.26–1.65)
Lambda free light chains: 6.2 mg/L (ref 5.7–26.3)

## 2022-08-30 DIAGNOSIS — M546 Pain in thoracic spine: Secondary | ICD-10-CM | POA: Diagnosis not present

## 2022-08-31 ENCOUNTER — Telehealth: Payer: Self-pay | Admitting: *Deleted

## 2022-08-31 LAB — MULTIPLE MYELOMA PANEL, SERUM
Albumin SerPl Elph-Mcnc: 3.2 g/dL (ref 2.9–4.4)
Albumin/Glob SerPl: 0.7 (ref 0.7–1.7)
Alpha 1: 0.2 g/dL (ref 0.0–0.4)
Alpha2 Glob SerPl Elph-Mcnc: 0.6 g/dL (ref 0.4–1.0)
B-Globulin SerPl Elph-Mcnc: 0.7 g/dL (ref 0.7–1.3)
Gamma Glob SerPl Elph-Mcnc: 3.5 g/dL — ABNORMAL HIGH (ref 0.4–1.8)
Globulin, Total: 5.1 g/dL — ABNORMAL HIGH (ref 2.2–3.9)
IgA: 6 mg/dL — ABNORMAL LOW (ref 64–422)
IgG (Immunoglobin G), Serum: 4223 mg/dL — ABNORMAL HIGH (ref 586–1602)
IgM (Immunoglobulin M), Srm: 10 mg/dL — ABNORMAL LOW (ref 26–217)
M Protein SerPl Elph-Mcnc: 3.3 g/dL — ABNORMAL HIGH
Total Protein ELP: 8.3 g/dL (ref 6.0–8.5)

## 2022-08-31 NOTE — Telephone Encounter (Signed)
Fax'd medical clearance to [facility] related to upcoming kyphoplasty.

## 2022-09-01 ENCOUNTER — Encounter: Payer: Self-pay | Admitting: Hematology and Oncology

## 2022-09-01 ENCOUNTER — Telehealth: Payer: Self-pay | Admitting: *Deleted

## 2022-09-01 NOTE — Telephone Encounter (Signed)
TCT patient regarding recent lab regarding recent lab results.  Spoke with pt and advised that  M protein has fallen from 4.9 to 3.3.  We are off to a good start with her treatment. Pt very pleased with these results.

## 2022-09-01 NOTE — Telephone Encounter (Signed)
Opened in error

## 2022-09-01 NOTE — Telephone Encounter (Signed)
-----   Message from Orson Slick, MD sent at 09/01/2022  9:52 AM EST ----- Please let Ms. Slider know that her M protein has fallen from 4.9 to 3.3.  We are off to a good start with her treatment. ----- Message ----- From: Buel Ream, Lab In [place] Sent: 08/28/2022   9:50 AM EST To: Orson Slick, MD

## 2022-09-04 ENCOUNTER — Other Ambulatory Visit: Payer: Self-pay

## 2022-09-04 ENCOUNTER — Telehealth: Payer: Self-pay | Admitting: Sports Medicine

## 2022-09-04 ENCOUNTER — Inpatient Hospital Stay: Payer: Medicare Other

## 2022-09-04 VITALS — BP 113/73 | HR 67 | Temp 98.4°F | Resp 18 | Wt 163.0 lb

## 2022-09-04 DIAGNOSIS — C9000 Multiple myeloma not having achieved remission: Secondary | ICD-10-CM | POA: Diagnosis not present

## 2022-09-04 DIAGNOSIS — E538 Deficiency of other specified B group vitamins: Secondary | ICD-10-CM | POA: Diagnosis not present

## 2022-09-04 DIAGNOSIS — D649 Anemia, unspecified: Secondary | ICD-10-CM | POA: Diagnosis not present

## 2022-09-04 DIAGNOSIS — Z5112 Encounter for antineoplastic immunotherapy: Secondary | ICD-10-CM | POA: Diagnosis not present

## 2022-09-04 LAB — CMP (CANCER CENTER ONLY)
ALT: 16 U/L (ref 0–44)
AST: 20 U/L (ref 15–41)
Albumin: 3.1 g/dL — ABNORMAL LOW (ref 3.5–5.0)
Alkaline Phosphatase: 65 U/L (ref 38–126)
Anion gap: 6 (ref 5–15)
BUN: 21 mg/dL (ref 8–23)
CO2: 29 mmol/L (ref 22–32)
Calcium: 8.3 mg/dL — ABNORMAL LOW (ref 8.9–10.3)
Chloride: 97 mmol/L — ABNORMAL LOW (ref 98–111)
Creatinine: 0.86 mg/dL (ref 0.44–1.00)
GFR, Estimated: >60 mL/min (ref >60)
Glucose, Bld: 99 mg/dL (ref 70–99)
Potassium: 4.2 mmol/L (ref 3.5–5.1)
Sodium: 132 mmol/L — ABNORMAL LOW (ref 135–145)
Total Bilirubin: 0.5 mg/dL (ref 0.3–1.2)
Total Protein: 8.5 g/dL — ABNORMAL HIGH (ref 6.5–8.1)

## 2022-09-04 LAB — CBC WITH DIFFERENTIAL (CANCER CENTER ONLY)
Abs Immature Granulocytes: 0.03 10*3/uL (ref 0.00–0.07)
Basophils Absolute: 0 10*3/uL (ref 0.0–0.1)
Basophils Relative: 1 %
Eosinophils Absolute: 0 10*3/uL (ref 0.0–0.5)
Eosinophils Relative: 1 %
HCT: 32.7 % — ABNORMAL LOW (ref 36.0–46.0)
Hemoglobin: 10.4 g/dL — ABNORMAL LOW (ref 12.0–15.0)
Immature Granulocytes: 1 %
Lymphocytes Relative: 9 %
Lymphs Abs: 0.5 10*3/uL — ABNORMAL LOW (ref 0.7–4.0)
MCH: 27.4 pg (ref 26.0–34.0)
MCHC: 31.8 g/dL (ref 30.0–36.0)
MCV: 86.3 fL (ref 80.0–100.0)
Monocytes Absolute: 0.6 10*3/uL (ref 0.1–1.0)
Monocytes Relative: 10 %
Neutro Abs: 4.9 10*3/uL (ref 1.7–7.7)
Neutrophils Relative %: 78 %
Platelet Count: 175 10*3/uL (ref 150–400)
RBC: 3.79 MIL/uL — ABNORMAL LOW (ref 3.87–5.11)
RDW: 20.5 % — ABNORMAL HIGH (ref 11.5–15.5)
WBC Count: 6.2 10*3/uL (ref 4.0–10.5)
nRBC: 0 % (ref 0.0–0.2)

## 2022-09-04 MED ORDER — BORTEZOMIB CHEMO SQ INJECTION 3.5 MG (2.5MG/ML)
1.3000 mg/m2 | Freq: Once | INTRAMUSCULAR | Status: AC
  Administered 2022-09-04: 2.25 mg via SUBCUTANEOUS
  Filled 2022-09-04: qty 0.9

## 2022-09-04 NOTE — Patient Instructions (Signed)
[facility]  Discharge Instructions: Thank you for choosing [facility] to provide your oncology and hematology care.   If you have a lab appointment with the [facility], please go directly to the [facility] and check in at the registration area.   Wear comfortable clothing and clothing appropriate for easy access to any Portacath or PICC line.   We strive to give you quality time with your provider. You may need to reschedule your appointment if you arrive late (15 or more minutes).  Arriving late affects you and other patients whose appointments are after yours.  Also, if you miss three or more appointments without notifying the office, you may be dismissed from the clinic at the provider's discretion.      For prescription refill requests, have your pharmacy contact our office and allow 72 hours for refills to be completed.    Today you received the following chemotherapy and/or immunotherapy agents; Velcade      To help prevent nausea and vomiting after your treatment, we encourage you to take your nausea medication as directed.  BELOW ARE SYMPTOMS THAT SHOULD BE REPORTED IMMEDIATELY: *FEVER GREATER THAN 100.4 F (38 C) OR HIGHER *CHILLS OR SWEATING *NAUSEA AND VOMITING THAT IS NOT CONTROLLED WITH YOUR NAUSEA MEDICATION *UNUSUAL SHORTNESS OF BREATH *UNUSUAL BRUISING OR BLEEDING *URINARY PROBLEMS (pain or burning when urinating, or frequent urination) *BOWEL PROBLEMS (unusual diarrhea, constipation, pain near the anus) TENDERNESS IN MOUTH AND THROAT WITH OR WITHOUT PRESENCE OF ULCERS (sore throat, sores in mouth, or a toothache) UNUSUAL RASH, SWELLING OR PAIN  UNUSUAL VAGINAL DISCHARGE OR ITCHING   Items with * indicate a potential emergency and should be followed up as soon as possible or go to the Emergency Department if any problems should occur.  Please show the CHEMOTHERAPY ALERT CARD or IMMUNOTHERAPY ALERT CARD at check-in to  the Emergency Department and triage nurse.  Should you have questions after your visit or need to cancel or reschedule your appointment, please contact [facility]  Dept: [phone]  and follow the prompts.  Office hours are 8:00 a.m. to 4:30 p.m. Monday - Friday. Please note that voicemails left after 4:00 p.m. may not be returned until the following business day.  We are closed weekends and major holidays. You have access to a nurse at all times for urgent questions. Please call the main number to the clinic Dept: [phone] and follow the prompts.   For any non-urgent questions, you may also contact your provider using MyChart. We now offer e-Visits for anyone 68 and older to request care online for non-urgent symptoms. For details visit [email or web address].   Also download the MyChart app! Go to the app store, search "MyChart", open the app, select [facility], and log in with your MyChart username and password.  Masks are optional in the cancer centers. If you would like for your care team to wear a mask while they are taking care of you, please let them know. You may have one support Andreah Goheen who is at least 86 years old accompany you for your appointments.

## 2022-09-06 ENCOUNTER — Ambulatory Visit (HOSPITAL_COMMUNITY): Payer: Self-pay | Admitting: Orthopedic Surgery

## 2022-09-06 ENCOUNTER — Other Ambulatory Visit: Payer: Self-pay

## 2022-09-06 ENCOUNTER — Encounter (HOSPITAL_COMMUNITY): Payer: Self-pay | Admitting: Orthopedic Surgery

## 2022-09-06 DIAGNOSIS — M546 Pain in thoracic spine: Secondary | ICD-10-CM | POA: Diagnosis not present

## 2022-09-06 NOTE — Progress Notes (Signed)
Anesthesia Chart Review: Same day workup  Follows with cardiology for history of possibly mild coronary spasm by coronary angiogram in 2014,  severe AS s/p TAVR 09/03/2018, asymptomatic bilateral carotid artery stenosis, HFpEF, essential hypertension, hyperlipidemia. Patient was admitted 04/03/2022 - 04/05/2022 with acute on chronic diastolic heart failure and hyponatremia (likely dilutional), as well as AKI.  Patient was diuresed and discharged on the following medications from a cardiac standpoint: Aspirin, carvedilol, Farxiga, losartan, pravastatin, torsemide 20 mg p.o. daily.  She again presented on 05/15/2022 with shortness of breath and received IV furosemide and discharged home.   Last seen by Dr. Einar [name] 06/22/22 and noted to be back at baseline and overall stable from cardiac standpoint. No changes to management. She was referred for supervised exercise program. Cardiac clearance letter 08/29/22 states, "[name] is at low risk, from a cardiac standpoint, for her upcoming procedure: kyphoplasty.  It is ok to proceed without further cardiac testing."  Recent diagnosis of multiple myeloma, following with oncologist Dr. Lorenso [name], being treated with chemotherapy.   Preop labs reviewed, mild hyponatremia with sodium 132, mild anemia with Hgb 10.4, otherwise unremarkable.  Pt will need DOS evaluation.  EKG 06/22/22: Sinus Rhythm  -Negative precordial T-waves.  Carotid artery duplex 02/15/2022:  Duplex suggests stenosis in the right internal carotid artery (1-15%).  Duplex suggests stenosis in the right common carotid artery (<50%).  Duplex suggests stenosis in the left internal carotid artery (50-69%).  Duplex suggests stenosis in the left external carotid artery (<50%).  Antegrade right vertebral artery flow. Antegrade left vertebral artery  flow.  Compared to the study done on 01/05/2021, there is no significant change.  Follow up in six months is appropriate if clinically indicated.    TTE 04/03/22:  1. Left ventricular ejection fraction, by estimation, is 60 to 65%. The  left ventricle has normal function. The left ventricle has no regional  wall motion abnormalities. There is moderate left ventricular hypertrophy.  Left ventricular diastolic  parameters are consistent with Grade II diastolic dysfunction  (pseudonormalization).   2. Right ventricular systolic function is normal. The right ventricular  size is normal. Tricuspid regurgitation signal is inadequate for assessing  PA pressure.   3. Left atrial size was moderately dilated.   4. The mitral valve is degenerative. Trivial mitral valve regurgitation.  Moderate mitral annular calcification.   5. Aortic valve regurgitation is not visualized. Procedure Date:  09/03/2018.   6. Aortic no signficant ascending aortic aneurysm.   7. The inferior vena cava is normal in size with greater than 50%  respiratory variability, suggesting right atrial pressure of 3 mmHg.   Comparison(s): No significant change from prior study.    [name] Short Stay Center/Anesthesiology Phone [phone] 09/06/2022 4:28 PM

## 2022-09-06 NOTE — Anesthesia Preprocedure Evaluation (Signed)
Anesthesia Evaluation  Patient identified by MRN, date of birth, ID band Patient awake    Reviewed: Allergy & Precautions, H&P , NPO status , Patient's Chart, lab work & pertinent test results  Airway Mallampati: II   Neck ROM: full    Dental   Pulmonary neg pulmonary ROS   breath sounds clear to auscultation       Cardiovascular hypertension, +CHF  + Valvular Problems/Murmurs  Rhythm:regular Rate:Normal  S/p TAVR 2019  TTE (03/2022): LVEF normal. TAVR valve functioning normally.    Neuro/Psych    GI/Hepatic ,GERD  ,,  Endo/Other  Hypothyroidism  stones  Renal/GU      Musculoskeletal  (+) Arthritis ,    Abdominal   Peds  Hematology  (+) Blood dyscrasia, anemia   Anesthesia Other Findings   Reproductive/Obstetrics                             Anesthesia Physical Anesthesia Plan  ASA: 3  Anesthesia Plan: MAC   Post-op Pain Management:    Induction: Intravenous  PONV Risk Score and Plan: 2 and Propofol infusion and Treatment may vary due to age or medical condition  Airway Management Planned: Simple Face Mask  Additional Equipment:   Intra-op Plan:   Post-operative Plan:   Informed Consent: I have reviewed the patients History and Physical, chart, labs and discussed the procedure including the risks, benefits and alternatives for the proposed anesthesia with the patient or authorized representative who has indicated his/her understanding and acceptance.     Dental advisory given  Plan Discussed with: CRNA, Anesthesiologist and Surgeon  Anesthesia Plan Comments: (PAT note by Karoline Caldwell, PA-C: Follows with cardiology for history of possibly mild coronary spasm by coronary angiogram in 2014, severe AS s/p TAVR 09/03/2018,asymptomatic bilateral carotid artery stenosis, HFpEF,essential hypertension, hyperlipidemia. Patient was admitted 04/03/2022 - 04/05/2022 with acute on  chronic diastolic heart failure and hyponatremia (likely dilutional), as well as AKI. Patient was diuresed and discharged on the following medications from a cardiac standpoint: Aspirin, carvedilol, Farxiga, losartan, pravastatin, torsemide 20 mg p.o. daily. [name] on 05/15/2022 with shortness of breath and received IV furosemide and discharged home.   Last seen by Dr. Einar Gip 06/22/22 and noted to be back at baseline and overall stable from cardiac standpoint. No changes to management. She was referred for supervised exercise program. Cardiac clearance letter 08/29/22 states, "[place] at low risk, from a cardiac standpoint, for herupcoming procedure: kyphoplasty. It is ok to proceed without further cardiac testing."  Recent diagnosis of multiple myeloma, following with oncologist Dr. Lorenso Courier, being treated with chemotherapy.   Preop labs reviewed, mild hyponatremia with sodium 132, mild anemia with Hgb 10.4, otherwise unremarkable.  Pt will need DOS evaluation.  EKG 06/22/22: Sinus Rhythm  -Negative precordial T-waves.  Carotid artery duplex 02/15/2022:  Duplex suggests stenosis in the right internal carotid artery (1-15%).  Duplex suggests stenosis in the right common carotid artery (<50%).  Duplex suggests stenosis in the left internal carotid artery (50-69%).  Duplex suggests stenosis in the left external carotid artery (<50%).  Antegrade right vertebral artery flow. Antegrade left vertebral artery  flow.  Compared to the study done on 01/05/2021, there is no significant change.  Follow up in six months is appropriate if clinically indicated.   TTE 04/03/22: 1. Left ventricular ejection fraction, by estimation, is 60 to 65%. The  left ventricle has normal function. The left ventricle has no regional  wall motion abnormalities. There is moderate left ventricular hypertrophy.  Left ventricular diastolic  parameters are consistent with Grade II diastolic  dysfunction  (pseudonormalization).  2. Right ventricular systolic function is normal. The right ventricular  size is normal. Tricuspid regurgitation signal is inadequate for assessing  PA pressure.  3. Left atrial size was moderately dilated.  4. The mitral valve is degenerative. Trivial mitral valve regurgitation.  Moderate mitral annular calcification.  5. Aortic valve regurgitation is not visualized. Procedure Date:  09/03/2018.  6. Aortic no signficant ascending aortic aneurysm.  7. The inferior vena cava is normal in size with greater than 50%  respiratory variability, suggesting right atrial pressure of 3 mmHg.   Comparison(s): No significant change from prior study.   )        Anesthesia Quick Evaluation

## 2022-09-06 NOTE — Progress Notes (Signed)
PCP - Dr. Theda Sers  Cardiologist - Dr. Einar Gip  EP- Denies  Endocrine- Denies  Pulm- Denies  Onc/Hem- Dr. Lorenso Courier  Chest x-ray - 05/15/22 (E)  EKG - 06/22/22 (E)  Stress Test - Denies  ECHO - 04/03/22 (E)  Cardiac Cath - Denies  AICD-na PM-na LOOP-na  Nerve Stimulator- Denies  Dialysis- Denies  Sleep Study - Denies CPAP - Denies  LABS- 09/07/22: PCR 09/04/22(E): CBC w/D, CMP  ASA- LD- 11/15  ERAS- No  HA1C- Denies  Anesthesia- Yes- cardiac history  Pt denies having chest pain, sob, or fever during the pre-op phone call. All instructions explained to the pt, with a verbal understanding of the material. Pt also instructed to wear a mask and social distance if she goes out. The opportunity to ask questions was provided.

## 2022-09-06 NOTE — Progress Notes (Signed)
S.D.W- Instructions   Your procedure is scheduled on Thurs., Nov. 16, 2023 from 12:22PM-1:33PM.  Report to [facility] Main Entrance "A" at 9:50 A.M., then check in with the Admitting office.  Call this number if you have problems the morning of surgery:  [phone]             If you experience any cold or flu symptoms such as cough, fever, chills, shortness of breath, etc. between now and your scheduled surgery, please notify us at the above         number.  Remember:  Do not eat or drink after midnight on Nov. 15th    Take these medicines the morning of surgery with A SIP OF WATER: Acyclovir (ZOVIRAX)  Carvedilol (COREG)  HYDROcodone-acetaminophen (NORCO/VICODIN)  Evothyroxine (SYNTHROID, LEVOTHROID)   If Needed: Esomeprazole (NEXIUM)  Ondansetron (ZOFRAN)   As of today, STOP taking any Aspirin (unless otherwise instructed by your surgeon) Aleve, Naproxen, Ibuprofen, Motrin, Advil, Goody's, BC's, all herbal medications, fish oil, and all vitamins.      Do not wear jewelry or makeup. Do not wear lotions, powders, perfumes or deodorant. Do not shave 48 hours prior to surgery.  Do not bring valuables to the hospital. Do not wear nail polish, gel polish, artificial nails, or any other type of covering on natural nails (fingers and toes) If you have artificial nails or gel coating that need to be removed by a nail salon, please have this removed prior to surgery. Artificial nails or gel coating may interfere with anesthesia's ability to adequately monitor your vital signs.  [facility] is not responsible for any belongings or valuables.    Do NOT Smoke (Tobacco/Vaping)  24 hours prior to your procedure  If you use a CPAP at night, you may bring your mask for your overnight stay.   Contacts, glasses, hearing aids, dentures or partials may not be worn into surgery, please bring cases for these belongings   For patients admitted to the hospital, discharge time will be determined by  your treatment team.   Patients discharged the day of surgery will not be allowed to drive home, and someone needs to stay with them for 24 hours.  Special instructions:    Oral Hygiene is also important to reduce your risk of infection.  Remember - BRUSH YOUR TEETH THE MORNING OF SURGERY WITH YOUR REGULAR TOOTHPASTE  [facility]- Preparing For Surgery  Before surgery, you can play an important role. Because skin is not sterile, your skin needs to be as free of germs as possible. You can reduce the number of germs on your skin by washing with Antibacterial Soap before surgery.     Please follow these instructions carefully.     Shower the NIGHT BEFORE SURGERY and the MORNING OF SURGERY with Antibacterial Soap.   Pat yourself dry with a CLEAN TOWEL.  Wear CLEAN PAJAMAS to bed the night before surgery  Place CLEAN SHEETS on your bed the night before your surgery  DO NOT SLEEP WITH PETS.  Day of Surgery:  Take a shower with Antibacterial soap. Wear Clean/Comfortable clothing the morning of surgery Do not apply any deodorants/lotions.   Remember to brush your teeth WITH YOUR REGULAR TOOTHPASTE.   If you test positive for Covid, or been in contact with anyone that has tested positive in the last 10 days, please notify your surgeon.  SURGICAL WAITING ROOM VISITATION Patients having surgery or a procedure may have no more than 2 support people  in the waiting area - these visitors may rotate.   Children under the age of 41 must have an adult with them who is not the patient. If the patient needs to stay at the hospital during part of their recovery, the visitor guidelines for inpatient rooms apply. Pre-op nurse will coordinate an appropriate time for 1 support person to accompany patient in pre-op.  This support person may not rotate.   Please refer to the [facility] website for the visitor guidelines for Inpatients (after your surgery is over and you are in a regular room).

## 2022-09-07 ENCOUNTER — Ambulatory Visit (HOSPITAL_COMMUNITY)
Admission: RE | Admit: 2022-09-07 | Discharge: 2022-09-07 | Disposition: A | Discharge status: Home / Self Care | Payer: Medicare Other | Attending: Orthopedic Surgery | Admitting: Orthopedic Surgery

## 2022-09-07 ENCOUNTER — Encounter (HOSPITAL_COMMUNITY)
Admission: RE | Disposition: A | Discharge status: Home / Self Care | Payer: Self-pay | Source: Home / Self Care | Attending: Orthopedic Surgery

## 2022-09-07 ENCOUNTER — Encounter (HOSPITAL_COMMUNITY): Payer: Self-pay | Admitting: Orthopedic Surgery

## 2022-09-07 ENCOUNTER — Ambulatory Visit (HOSPITAL_BASED_OUTPATIENT_CLINIC_OR_DEPARTMENT_OTHER): Payer: Medicare Other | Admitting: Physician Assistant

## 2022-09-07 ENCOUNTER — Other Ambulatory Visit: Payer: Medicare Other

## 2022-09-07 ENCOUNTER — Ambulatory Visit (HOSPITAL_COMMUNITY): Payer: Medicare Other | Admitting: Physician Assistant

## 2022-09-07 ENCOUNTER — Ambulatory Visit (HOSPITAL_COMMUNITY): Payer: Medicare Other

## 2022-09-07 ENCOUNTER — Other Ambulatory Visit: Payer: Self-pay

## 2022-09-07 DIAGNOSIS — I110 Hypertensive heart disease with heart failure: Secondary | ICD-10-CM | POA: Diagnosis not present

## 2022-09-07 DIAGNOSIS — M4854XA Collapsed vertebra, not elsewhere classified, thoracic region, initial encounter for fracture: Secondary | ICD-10-CM

## 2022-09-07 DIAGNOSIS — M1990 Unspecified osteoarthritis, unspecified site: Secondary | ICD-10-CM | POA: Diagnosis not present

## 2022-09-07 DIAGNOSIS — Z7982 Long term (current) use of aspirin: Secondary | ICD-10-CM | POA: Insufficient documentation

## 2022-09-07 DIAGNOSIS — Z9889 Other specified postprocedural states: Secondary | ICD-10-CM | POA: Diagnosis not present

## 2022-09-07 DIAGNOSIS — I509 Heart failure, unspecified: Secondary | ICD-10-CM | POA: Diagnosis not present

## 2022-09-07 DIAGNOSIS — Z952 Presence of prosthetic heart valve: Secondary | ICD-10-CM | POA: Diagnosis not present

## 2022-09-07 DIAGNOSIS — E785 Hyperlipidemia, unspecified: Secondary | ICD-10-CM | POA: Diagnosis not present

## 2022-09-07 DIAGNOSIS — K219 Gastro-esophageal reflux disease without esophagitis: Secondary | ICD-10-CM | POA: Diagnosis not present

## 2022-09-07 DIAGNOSIS — C9000 Multiple myeloma not having achieved remission: Secondary | ICD-10-CM | POA: Diagnosis not present

## 2022-09-07 DIAGNOSIS — D638 Anemia in other chronic diseases classified elsewhere: Secondary | ICD-10-CM

## 2022-09-07 DIAGNOSIS — I5032 Chronic diastolic (congestive) heart failure: Secondary | ICD-10-CM | POA: Insufficient documentation

## 2022-09-07 DIAGNOSIS — M8008XA Age-related osteoporosis with current pathological fracture, vertebra(e), initial encounter for fracture: Secondary | ICD-10-CM | POA: Diagnosis not present

## 2022-09-07 DIAGNOSIS — D649 Anemia, unspecified: Secondary | ICD-10-CM | POA: Diagnosis not present

## 2022-09-07 DIAGNOSIS — D759 Disease of blood and blood-forming organs, unspecified: Secondary | ICD-10-CM | POA: Insufficient documentation

## 2022-09-07 DIAGNOSIS — Z79899 Other long term (current) drug therapy: Secondary | ICD-10-CM | POA: Diagnosis not present

## 2022-09-07 DIAGNOSIS — E039 Hypothyroidism, unspecified: Secondary | ICD-10-CM | POA: Diagnosis not present

## 2022-09-07 DIAGNOSIS — S22060A Wedge compression fracture of T7-T8 vertebra, initial encounter for closed fracture: Secondary | ICD-10-CM | POA: Diagnosis not present

## 2022-09-07 HISTORY — PX: KYPHOPLASTY: SHX5884

## 2022-09-07 SURGERY — KYPHOPLASTY
Anesthesia: Monitor Anesthesia Care | Site: Spine Thoracic

## 2022-09-07 MED ORDER — CHLORHEXIDINE GLUCONATE 0.12 % MT SOLN
15.0000 mL | OROMUCOSAL | Status: AC
  Filled 2022-09-07: qty 15

## 2022-09-07 MED ORDER — LACTATED RINGERS IV SOLN: INTRAVENOUS | Status: DC

## 2022-09-07 MED ORDER — PROPOFOL 500 MG/50ML IV EMUL
INTRAVENOUS | Status: DC | PRN
  Administered 2022-09-07: 75 ug/kg/min via INTRAVENOUS

## 2022-09-07 MED ORDER — BUPIVACAINE-EPINEPHRINE (PF) 0.25% -1:200000 IJ SOLN
INTRAMUSCULAR | Status: AC
  Filled 2022-09-07: qty 30

## 2022-09-07 MED ORDER — BUPIVACAINE LIPOSOME 1.3 % IJ SUSP
INTRAMUSCULAR | Status: AC
  Filled 2022-09-07: qty 20

## 2022-09-07 MED ORDER — PROPOFOL 1000 MG/100ML IV EMUL
INTRAVENOUS | Status: AC
  Filled 2022-09-07: qty 100

## 2022-09-07 MED ORDER — HYDRALAZINE HCL 20 MG/ML IJ SOLN
INTRAMUSCULAR | Status: AC
  Filled 2022-09-07: qty 1

## 2022-09-07 MED ORDER — 0.9 % SODIUM CHLORIDE (POUR BTL) OPTIME
TOPICAL | Status: DC | PRN
  Administered 2022-09-07: 1000 mL

## 2022-09-07 MED ORDER — OXYCODONE HCL 5 MG/5ML PO SOLN: 5.0000 mg | Freq: Once | ORAL | Status: DC | PRN

## 2022-09-07 MED ORDER — KETAMINE HCL 10 MG/ML IJ SOLN
INTRAMUSCULAR | Status: DC | PRN
  Administered 2022-09-07 (×2): 5 mg via INTRAVENOUS

## 2022-09-07 MED ORDER — BUPIVACAINE-EPINEPHRINE 0.25% -1:200000 IJ SOLN
INTRAMUSCULAR | Status: DC | PRN
  Administered 2022-09-07: 10 mL

## 2022-09-07 MED ORDER — FENTANYL CITRATE (PF) 100 MCG/2ML IJ SOLN: 25.0000 ug | INTRAMUSCULAR | Status: DC | PRN

## 2022-09-07 MED ORDER — ONDANSETRON HCL 4 MG/2ML IJ SOLN: 4.0000 mg | Freq: Four times a day (QID) | INTRAMUSCULAR | Status: DC | PRN

## 2022-09-07 MED ORDER — HYDRALAZINE HCL 20 MG/ML IJ SOLN
10.0000 mg | Freq: Once | INTRAMUSCULAR | Status: AC
  Administered 2022-09-07: 10 mg via INTRAVENOUS

## 2022-09-07 MED ORDER — ONDANSETRON HCL 4 MG/2ML IJ SOLN
INTRAMUSCULAR | Status: DC | PRN
  Administered 2022-09-07: 4 mg via INTRAVENOUS

## 2022-09-07 MED ORDER — BUPIVACAINE-EPINEPHRINE (PF) 0.25% -1:200000 IJ SOLN
INTRAMUSCULAR | Status: DC | PRN
  Administered 2022-09-07: 36 mL

## 2022-09-07 MED ORDER — KETAMINE HCL 50 MG/5ML IJ SOSY
PREFILLED_SYRINGE | INTRAMUSCULAR | Status: AC
  Filled 2022-09-07: qty 5

## 2022-09-07 MED ORDER — HYDROCODONE-ACETAMINOPHEN 5-325 MG PO TABS: 1.0000 | ORAL_TABLET | Freq: Four times a day (QID) | ORAL | 0 refills | Status: DC | PRN

## 2022-09-07 MED ORDER — CHLORHEXIDINE GLUCONATE 0.12 % MT SOLN
OROMUCOSAL | Status: AC
  Administered 2022-09-07: 15 mL via OROMUCOSAL
  Filled 2022-09-07: qty 15

## 2022-09-07 MED ORDER — OXYCODONE HCL 5 MG PO TABS: 5.0000 mg | ORAL_TABLET | Freq: Once | ORAL | Status: DC | PRN

## 2022-09-07 MED ORDER — LIDOCAINE 2% (20 MG/ML) 5 ML SYRINGE
INTRAMUSCULAR | Status: AC
  Filled 2022-09-07: qty 5

## 2022-09-07 MED ORDER — THROMBIN 20000 UNITS EX SOLR
CUTANEOUS | Status: AC
  Filled 2022-09-07: qty 20000

## 2022-09-07 MED ORDER — BUPIVACAINE LIPOSOME 1.3 % IJ SUSP: INTRAMUSCULAR | Status: DC | PRN

## 2022-09-07 MED ORDER — ONDANSETRON HCL 4 MG/2ML IJ SOLN
INTRAMUSCULAR | Status: AC
  Filled 2022-09-07: qty 2

## 2022-09-07 MED ORDER — CEFAZOLIN SODIUM-DEXTROSE 2-3 GM-%(50ML) IV SOLR
INTRAVENOUS | Status: DC | PRN
  Administered 2022-09-07: 2 g via INTRAVENOUS

## 2022-09-07 SURGICAL SUPPLY — 41 items
BAG COUNTER SPONGE SURGICOUNT (BAG) IMPLANT
BLADE SURG 15 STRL LF DISP TIS (BLADE) ×1 IMPLANT
BLADE SURG 15 STRL SS (BLADE) ×1
BNDG ADH 1X3 SHEER STRL LF (GAUZE/BANDAGES/DRESSINGS) ×2 IMPLANT
COVER MAYO STAND STRL (DRAPES) ×1 IMPLANT
COVER SURGICAL LIGHT HANDLE (MISCELLANEOUS) ×1 IMPLANT
CURETTE EXPRESS SZ2 7MM (INSTRUMENTS) IMPLANT
CURETTE WEDGE 8.5MM KYPHX (MISCELLANEOUS) IMPLANT
CURRETTE EXPRESS SZ2 7MM (INSTRUMENTS) ×1
DERMABOND ADVANCED .7 DNX12 (GAUZE/BANDAGES/DRESSINGS) ×1 IMPLANT
DEVICE BIOPSY BONE KYPHX (INSTRUMENTS) IMPLANT
DRAIN CHANNEL 15F RND FF W/TCR (WOUND CARE) IMPLANT
DRAPE C-ARM 42X72 X-RAY (DRAPES) ×2 IMPLANT
DRAPE INCISE IOBAN 66X45 STRL (DRAPES) ×1 IMPLANT
DRAPE LAPAROTOMY T 102X78X121 (DRAPES) ×1 IMPLANT
DRAPE WARM FLUID 44X44 (DRAPES) ×1 IMPLANT
DURAPREP 26ML APPLICATOR (WOUND CARE) ×1 IMPLANT
GLOVE BIO SURGEON STRL SZ 6.5 (GLOVE) ×1 IMPLANT
GLOVE BIOGEL PI IND STRL 6.5 (GLOVE) ×1 IMPLANT
GLOVE BIOGEL PI IND STRL 8.5 (GLOVE) IMPLANT
GLOVE SS BIOGEL STRL SZ 8.5 (GLOVE) ×1 IMPLANT
GOWN STRL REUS W/ TWL LRG LVL3 (GOWN DISPOSABLE) ×2 IMPLANT
GOWN STRL REUS W/TWL 2XL LVL3 (GOWN DISPOSABLE) ×1 IMPLANT
GOWN STRL REUS W/TWL LRG LVL3 (GOWN DISPOSABLE) ×2
KIT BASIN OR (CUSTOM PROCEDURE TRAY) ×1 IMPLANT
KIT TURNOVER KIT B (KITS) ×1 IMPLANT
NDL SPNL 22GX3.5 QUINCKE BK (NEEDLE) ×1 IMPLANT
NEEDLE HYPO 22GX1.5 SAFETY (NEEDLE) ×1 IMPLANT
NEEDLE SPNL 22GX3.5 QUINCKE BK (NEEDLE) ×1 IMPLANT
NS IRRIG 1000ML POUR BTL (IV SOLUTION) ×1 IMPLANT
PACK BASIC III (CUSTOM PROCEDURE TRAY) ×1
PACK SRG BSC III STRL LF ECLPS (CUSTOM PROCEDURE TRAY) ×1 IMPLANT
PAD ARMBOARD 7.5X6 YLW CONV (MISCELLANEOUS) ×2 IMPLANT
SPONGE T-LAP 4X18 ~~LOC~~+RFID (SPONGE) ×1 IMPLANT
SUT MNCRL AB 3-0 PS2 18 (SUTURE) ×1 IMPLANT
SYR CONTROL 10ML LL (SYRINGE) ×1 IMPLANT
TAMP BONE INFLATABLE 10/3 (BALLOONS) IMPLANT
TOWEL GREEN STERILE (TOWEL DISPOSABLE) ×1 IMPLANT
TRAY KYPHOPAK 15/3 ONESTEP 1ST (MISCELLANEOUS) IMPLANT
TRAY KYPHOPAK 20/3 ONESTEP 1ST (MISCELLANEOUS) IMPLANT
WATER STERILE IRR 1000ML POUR (IV SOLUTION) ×1 IMPLANT

## 2022-09-07 NOTE — H&P (Signed)
History: Zoe [name] a very pleasant 88 year old woman who presents with severe midthoracic pain.  Imaging confirmed an acute/subacute compression fracture at T8.  Because of the progressive pain and loss in quality of life she is expressed a desire to move forward with a T8 kyphoplasty.  All appropriate risks, benefits, and alternatives to surgery were discussed and the decision was made to move forward with surgery.  Consent was obtained.  Past Medical History:  Diagnosis Date   Arthritis    some - per patient   Breast cancer ([place])    breast cancer / left    Cataract    bilat    GERD (gastroesophageal reflux disease)    History of kidney stones    Hyperlipidemia    Hypertension    Hypothyroidism    Macular degeneration    Left   S/P TAVR (transcatheter aortic valve replacement) 09/03/2018   23 mm Edwards Sapien 3 transcatheter heart valve placed via percutaneous right transfemoral approach    Severe aortic stenosis    Stress incontinence    Thyroid disease    Tinnitus     Allergies  Allergen Reactions   Penicillins Other (See Comments)    UNSPECIFIED REACTION  Patient does not remember reaction.  Has patient had a PCN reaction causing immediate rash, facial/tongue/throat swelling, SOB or lightheadedness with hypotension: no Has patient had a PCN reaction causing severe rash involving mucus membranes or skin necrosis: no Has patient had a PCN reaction that required hospitalization no Has patient had a PCN reaction occurring within the last 10 years: no If all of the above answers are "NO", then may proceed with Cephalosporin use.    Sulfa Antibiotics Other (See Comments)    UNSPECIFIED REACTION  "maybe vision issues? "    No current facility-administered medications on file prior to encounter.   Current Outpatient Medications on File Prior to Encounter  Medication Sig Dispense Refill   acyclovir (ZOVIRAX) 400 MG tablet Take 1 tablet (400 mg total) by mouth 2  (two) times daily. 180 tablet 3   Artificial Tear Solution (SOOTHE XP) SOLN Place 1 drop into both eyes every evening.     aspirin 81 MG chewable tablet Chew 1 tablet (81 mg total) by mouth daily. (Patient taking differently: Chew 81 mg by mouth every evening.)     calcitonin, salmon, (MIACALCIN/FORTICAL) 200 UNIT/ACT nasal spray Place 1 spray into alternate nostrils daily.     carvedilol (COREG) 12.5 MG tablet Take 12.5 mg by mouth 2 (two) times daily with a meal.     Cholecalciferol (VITAMIN D3) 2000 units TABS Take 2,000 Units by mouth daily.     [employer] VITAMIN B12 1000 MCG tablet TAKE 1 TABLET BY MOUTH EVERY DAY 90 tablet 1   dapagliflozin propanediol (FARXIGA) 10 MG TABS tablet Take 1 tablet (10 mg total) by mouth daily. 30 tablet 2   dexamethasone (DECADRON) 4 MG tablet Take 5 tablets (20 mg total) by mouth once a week. Take 5 tablets in the morning on days when receiving the chemotherapy shot. 20 tablet 5   esomeprazole (NEXIUM) 20 MG capsule Take 20 mg by mouth daily as needed (Heartburn).     ferrous sulfate 325 (65 FE) MG tablet Take 325 mg by mouth daily with breakfast.     furosemide (LASIX) 20 MG tablet PLEASE SEE ATTACHED FOR DETAILED DIRECTIONS (Patient taking differently: Take 20 mg by mouth daily.) 90 tablet 1   HYDROcodone-acetaminophen (NORCO/VICODIN) 5-325 MG tablet Take  1 tablet by mouth 3 (three) times daily.     levothyroxine (SYNTHROID, LEVOTHROID) 100 MCG tablet Take 100 mcg by mouth daily before breakfast.  2   losartan (COZAAR) 25 MG tablet Take 1 tablet (25 mg total) by mouth daily. 90 tablet 1   Multiple Vitamins-Minerals (PRESERVISION AREDS 2) CAPS Take 1 capsule by mouth 2 (two) times daily.     ondansetron (ZOFRAN) 8 MG tablet Take 1 tablet (8 mg total) by mouth every 8 (eight) hours as needed. 30 tablet 0   potassium chloride (KLOR-CON M) 10 MEQ tablet Take 2 tablets (20 mEq total) by mouth daily as needed. Take with furosemide (Patient taking differently: Take 10 mEq  by mouth daily. Take with furosemide) 180 tablet 0   pravastatin (PRAVACHOL) 40 MG tablet Take 40 mg by mouth every evening.     prochlorperazine (COMPAZINE) 10 MG tablet Take 1 tablet (10 mg total) by mouth every 6 (six) hours as needed for nausea or vomiting. 30 tablet 0    Physical Exam: Vitals:   09/07/22 0956  BP: (!) 128/58  Pulse: 67  Resp: 17  Temp: 98.1 F (36.7 C)  SpO2: 98%   Body mass index is 28.87 kg/m. Clinical exam: Zoe [name] is a pleasant individual, who appears younger than their stated age.  She is alert and orientated 3.  No shortness of breath, chest pain.  Abdomen is soft and non-tender, negative loss of bowel and bladder control, no rebound tenderness.  Negative: skin lesions abrasions contusions  Peripheral pulses: 2+ peripheral pulses bilaterally. LE compartments are: Soft and nontender.  Gait pattern: normal  Assistive devices: none  Neuro: 5/5 motor strength in the lower extremity bilaterally. Negative Babinski test, no clonus normal sensation to light touch. Symmetrical 1+ deep tendon reflexes.  Musculoskeletal: Significant mid thoracic pain with direct palpation radiating into the paraspinal region.  Imaging: X-rays of the thoracic spine demonstrate a T8 compression fracture. Patient had a prior T11 fracture that was treated with a vertebral cement augmentation procedure  Thoracic MRI: completed on 08/30/2022 was reviewed with the patient. It was completed at [facility]; I have independently reviewed the images as well as the radiology report. No cord signal changes. T8 acute/subacute superior endplate burst type fracture with mild anterior superior edge plate wedge deformity. Several vertebral hemangiomas consistent with benign etiology. Unlikely to represent areas of multiple myeloma. There is a large partially demonstrated high T2 signal hepatic lesion measuring approximately 6.7 cm.   Image: DG Thoracic Spine 2 View  Result Date:  08/23/2022 CLINICAL DATA:  Back pain, multiple myeloma EXAM: THORACIC SPINE 2 VIEWS COMPARISON:  06/23/2022 FINDINGS: Frontal and lateral views of the thoracic spine are obtained. Alignment is anatomic. Chronic T12 compression deformity with prior vertebral augmentation, stable. There is minimal anterior wedging of the T8 vertebral body that has developed in the interim since the prior bone survey, with less than 10% loss of height anteriorly. No other acute bony abnormalities. Paraspinal soft tissues are unremarkable. Aortic valve prosthesis again noted. IMPRESSION: 1. Mild anterior wedging of the T8 vertebral body since prior exam, which could reflect developing anterior compression deformity. Less than 10% loss of height and no retropulsion. 2. No other acute displaced fractures. 3. Chronic T12 compression deformity and prior vertebral augmentation. Electronically Signed   By: Randa Ngo M.D.   On: 08/23/2022 13:41   DG Lumbar Spine 2-3 Views  Result Date: 08/23/2022 CLINICAL DATA:  Multiple myeloma, back pain EXAM: LUMBAR SPINE - 2-3 VIEW  COMPARISON:  12/26/2019 FINDINGS: Frontal and lateral views of the lumbar spine are obtained on 3 images. There are 5 non-rib-bearing lumbar type vertebral bodies identified. Grade 1 anterolisthesis of L4 on L5 is unchanged. There is mild degenerative retrolisthesis of L1 on L2 and L2 on L3, also stable. There are no acute displaced fractures. Multilevel spondylosis and facet hypertrophy throughout the lumbar spine again noted and unchanged, greatest at L1-2, L2-3, and L5-S1. Prior compression deformity and vertebral augmentation at T12. Stable atherosclerosis. IMPRESSION: 1. No acute bony abnormality. 2. Stable multilevel spondylosis and facet hypertrophy. Electronically Signed   By: Randa Ngo M.D.   On: 08/23/2022 13:39    A/P: Summary: Zoe [name] is a very pleasant 88 year old female with progressive mid thoracic pain. Imaging confirms an acute/subacute T8  compression fracture. At this point because of the progressive nature of her pain and loss in quality of life we have elected to move forward with surgery. Surgical plan is a kyphoplasty. I have reviewed the risks, benefits, and alternatives with the patient and her friend and all of their questions were addressed. Risks of kyphoplasty include: Infection, bleeding, nerve damage, death, stroke, paralysis. Leak of cement ongoing or worse pain, need for additional surgery including other fracture levels.

## 2022-09-07 NOTE — Transfer of Care (Signed)
Immediate Anesthesia Transfer of Care Note  Patient: [name]  Procedure(s) Performed: THORACIC EIGHT KYPHOPLASTY (Spine Thoracic)  Patient Location: PACU  Anesthesia Type:MAC  Level of Consciousness: awake, alert , and oriented  Airway & Oxygen Therapy: Patient Spontanous Breathing  Post-op Assessment: Report given to RN, Post -op Vital signs reviewed and stable, Patient moving all extremities X 4, and Patient able to stick tongue midline  Post vital signs: Reviewed  Last Vitals:  Vitals Value Taken Time  BP 150/77 09/07/22 1334  Temp 97.6   Pulse 74 09/07/22 1336  Resp 21 09/07/22 1336  SpO2 95 % 09/07/22 1336  Vitals shown include unvalidated device data.  Last Pain:  Vitals:   09/07/22 1001  TempSrc:   PainSc: 10-Worst pain ever      Patients Stated Pain Goal: 2 (69/45/03 8882)  Complications: No notable events documented.

## 2022-09-07 NOTE — Brief Op Note (Signed)
09/07/2022  1:57 PM  PATIENT:  Zoe [name]  88 y.o. female  PRE-OPERATIVE DIAGNOSIS:  T8 pathologic compression fracture  POST-OPERATIVE DIAGNOSIS:  T8 pathologic compression fracture  PROCEDURE:  Procedure(s) with comments: THORACIC EIGHT KYPHOPLASTY (N/A) - 1 hr Local with IV Regional 3 C-Bed  SURGEON:  Surgeon(s) and Role:    Melina Schools, MD - Primary  PHYSICIAN ASSISTANT:   ASSISTANTS: none   ANESTHESIA:   local and IV sedation  EBL:  1 mL   BLOOD ADMINISTERED:none  DRAINS: none   LOCAL MEDICATIONS USED:  MARCAINE    and OTHER exparel  SPECIMEN:  Source of Specimen:  core sample from T8  DISPOSITION OF SPECIMEN:  PATHOLOGY  COUNTS:  YES  TOURNIQUET:  * No tourniquets in log *  DICTATION: .Dragon Dictation  PLAN OF CARE: Discharge to home after PACU  PATIENT DISPOSITION:  PACU - hemodynamically stable.

## 2022-09-07 NOTE — Op Note (Signed)
OPERATIVE REPORT  DATE OF SURGERY: 09/07/2022  PATIENT NAME:  Zoe [name] MRN: [id number] DOB: [birth year]  PCP: [facility]  PRE-OPERATIVE DIAGNOSIS: T8 osteoporotic compression fracture  POST-OPERATIVE DIAGNOSIS: Same  PROCEDURE:   T8 kyphoplasty  SURGEON:  Melina Schools, MD  PHYSICIAN ASSISTANT: None  ANESTHESIA:   IV sedation and local  EBL: Minimal   Complications: None  BRIEF HISTORY: Zoe [name] is a 88 y.o. female who unfortunately had an atraumatic T8 compression fracture.  She had a prior T12 fracture that was treated with vertebroplasty several years ago.  The patient states the pain in the thoracic region is quite severe and expressed a desire to move forward with surgery.  After discussing treatment options we agreed to move forward with a T8 kyphoplasty.  PROCEDURE DETAILS: Patient was brought into the operating room and was properly positioned on the operating room table.  Once the patient was comfortable in the prone position on the operating room table IV anesthesia was administered.  A timeout was taken to confirm patient procedure and all other important data.  The back was then prepped and draped in a standard fashion.  Fluoroscopy was used to identify the T8 level.  I once I confirmed the appropriate level in both planes I marked out my incision site.  The 2 small incision sites were then infiltrated with quarter percent Marcaine with Exparel.  The deep process paraspinal muscle was also injected with local anesthesia.  Once we had adequate IV sedation and local anesthesia 2 small stab incisions were made and the Jamshidi needles were advanced.  I took an extrapedicular approach to the pedicle of T8.  I advanced using fluoroscopic guidance into the pedicle.  As I neared the medial wall of the pedicle I confirmed that I was just beyond the posterior wall of the vertebral body on the lateral view.  Having confirmed appropriate  trajectory I then took a sample of bone to send to pathology.  Patient does have a history of multiple myeloma and I did want to ensure that this was not a fracture secondary to her myeloma.  The inflatable bone tamps were then inserted and inflated.  I then removed the balloons and inserted approximately 2 cc of cement on the left side and 1-1/2 cc of cement on the right.  There was no posterior leak of cement noted.  Once the cement was allowed to harden I removed the Jamshidi needles and then cleaned the skin.  I then closed the stab incisions with an simple 3-0 Monocryl stitch.  Additional local anesthesia was then provided and Dermabond and Band-Aids were applied.  Patient was then transferred to the PACU without incident.  The end of the case all needle sponge counts were correct.  There were no adverse intraoperative events.   Melina Schools, MD 09/07/2022 1:51 PM

## 2022-09-07 NOTE — Anesthesia Postprocedure Evaluation (Signed)
Anesthesia Post Note  Patient: Zoe [name]  Procedure(s) Performed: THORACIC EIGHT KYPHOPLASTY (Spine Thoracic)     Patient location during evaluation: PACU Anesthesia Type: MAC Level of consciousness: awake and alert Pain management: pain level controlled Vital Signs Assessment: post-procedure vital signs reviewed and stable Respiratory status: spontaneous breathing, nonlabored ventilation, respiratory function stable and patient connected to nasal cannula oxygen Cardiovascular status: stable and blood pressure returned to baseline Postop Assessment: no apparent nausea or vomiting Anesthetic complications: no   No notable events documented.  Last Vitals:  Vitals:   09/07/22 1430 09/07/22 1433  BP: (!) 145/57   Pulse: 75 71  Resp: 19 11  Temp:  36.9 C  SpO2: 90% 99%    Last Pain:  Vitals:   09/07/22 1400  TempSrc:   PainSc: 0-No pain                 Allix Blomquist S

## 2022-09-08 ENCOUNTER — Encounter (HOSPITAL_COMMUNITY): Payer: Self-pay | Admitting: Orthopedic Surgery

## 2022-09-11 ENCOUNTER — Other Ambulatory Visit: Payer: Self-pay | Admitting: *Deleted

## 2022-09-11 ENCOUNTER — Other Ambulatory Visit: Payer: Medicare Other

## 2022-09-11 ENCOUNTER — Ambulatory Visit: Payer: Medicare Other

## 2022-09-11 ENCOUNTER — Inpatient Hospital Stay: Payer: Medicare Other | Admitting: Hematology and Oncology

## 2022-09-11 ENCOUNTER — Encounter: Payer: Self-pay | Admitting: Hematology and Oncology

## 2022-09-11 ENCOUNTER — Telehealth: Payer: Self-pay | Admitting: Pharmacy Technician

## 2022-09-11 ENCOUNTER — Other Ambulatory Visit: Payer: Self-pay

## 2022-09-11 ENCOUNTER — Telehealth: Payer: Self-pay | Admitting: Pharmacist

## 2022-09-11 ENCOUNTER — Inpatient Hospital Stay (HOSPITAL_BASED_OUTPATIENT_CLINIC_OR_DEPARTMENT_OTHER): Payer: Medicare Other

## 2022-09-11 ENCOUNTER — Other Ambulatory Visit (HOSPITAL_COMMUNITY): Payer: Self-pay

## 2022-09-11 VITALS — BP 127/57 | HR 73 | Temp 97.6°F | Resp 14

## 2022-09-11 DIAGNOSIS — Z5112 Encounter for antineoplastic immunotherapy: Secondary | ICD-10-CM | POA: Diagnosis not present

## 2022-09-11 DIAGNOSIS — K769 Liver disease, unspecified: Secondary | ICD-10-CM

## 2022-09-11 DIAGNOSIS — C9000 Multiple myeloma not having achieved remission: Secondary | ICD-10-CM

## 2022-09-11 DIAGNOSIS — D649 Anemia, unspecified: Secondary | ICD-10-CM | POA: Diagnosis not present

## 2022-09-11 DIAGNOSIS — E538 Deficiency of other specified B group vitamins: Secondary | ICD-10-CM | POA: Diagnosis not present

## 2022-09-11 LAB — CBC WITH DIFFERENTIAL (CANCER CENTER ONLY)
Abs Immature Granulocytes: 0.03 10*3/uL (ref 0.00–0.07)
Basophils Absolute: 0 10*3/uL (ref 0.0–0.1)
Basophils Relative: 0 %
Eosinophils Absolute: 0 10*3/uL (ref 0.0–0.5)
Eosinophils Relative: 0 %
HCT: 31.4 % — ABNORMAL LOW (ref 36.0–46.0)
Hemoglobin: 10.6 g/dL — ABNORMAL LOW (ref 12.0–15.0)
Immature Granulocytes: 0 %
Lymphocytes Relative: 8 %
Lymphs Abs: 0.5 10*3/uL — ABNORMAL LOW (ref 0.7–4.0)
MCH: 28.6 pg (ref 26.0–34.0)
MCHC: 33.8 g/dL (ref 30.0–36.0)
MCV: 84.9 fL (ref 80.0–100.0)
Monocytes Absolute: 0.6 10*3/uL (ref 0.1–1.0)
Monocytes Relative: 9 %
Neutro Abs: 5.7 10*3/uL (ref 1.7–7.7)
Neutrophils Relative %: 83 %
Platelet Count: 172 10*3/uL (ref 150–400)
RBC: 3.7 MIL/uL — ABNORMAL LOW (ref 3.87–5.11)
RDW: 20.3 % — ABNORMAL HIGH (ref 11.5–15.5)
WBC Count: 6.9 10*3/uL (ref 4.0–10.5)
nRBC: 0 % (ref 0.0–0.2)

## 2022-09-11 LAB — CMP (CANCER CENTER ONLY)
ALT: 14 U/L (ref 0–44)
AST: 21 U/L (ref 15–41)
Albumin: 3.2 g/dL — ABNORMAL LOW (ref 3.5–5.0)
Alkaline Phosphatase: 81 U/L (ref 38–126)
Anion gap: 6 (ref 5–15)
BUN: 20 mg/dL (ref 8–23)
CO2: 29 mmol/L (ref 22–32)
Calcium: 8.6 mg/dL — ABNORMAL LOW (ref 8.9–10.3)
Chloride: 98 mmol/L (ref 98–111)
Creatinine: 0.58 mg/dL (ref 0.44–1.00)
GFR, Estimated: >60 mL/min (ref >60)
Glucose, Bld: 118 mg/dL — ABNORMAL HIGH (ref 70–99)
Potassium: 3.9 mmol/L (ref 3.5–5.1)
Sodium: 133 mmol/L — ABNORMAL LOW (ref 135–145)
Total Bilirubin: 0.5 mg/dL (ref 0.3–1.2)
Total Protein: 8.1 g/dL (ref 6.5–8.1)

## 2022-09-11 MED ORDER — LENALIDOMIDE 25 MG PO CAPS: 25.0000 mg | ORAL_CAPSULE | Freq: Every day | ORAL | 0 refills | Status: DC

## 2022-09-11 MED ORDER — BORTEZOMIB CHEMO SQ INJECTION 3.5 MG (2.5MG/ML)
1.3000 mg/m2 | Freq: Once | INTRAMUSCULAR | Status: AC
  Administered 2022-09-11: 2.25 mg via SUBCUTANEOUS
  Filled 2022-09-11: qty 0.9

## 2022-09-11 MED ORDER — VITAMIN D3 50 MCG (2000 UT) PO CAPS: 2000.0000 [IU] | ORAL_CAPSULE | Freq: Every day | ORAL | Status: DC

## 2022-09-11 NOTE — Progress Notes (Signed)
Patient took her steroid at 31.

## 2022-09-11 NOTE — Telephone Encounter (Signed)
Oral Oncology Pharmacist Encounter  Received new prescription for Revlimid (lenalidomide) for the treatment of multiple myeloma in conjunction with bortezomib and dexamethasone, planned duration until disease progression or unacceptable drug toxicity.  CBC w/ Diff and CMP from 09/11/22 assessed, no baseline dose adjustments required. Prescription dose and frequency assessed for appropriateness. Appropriate for therapy initiation.   Current medication list in Epic reviewed, no relevant/significant DDIs with Revlimid identified.  Evaluated chart and no patient barriers to medication adherence noted.   Due to limited distribution nature of Revlimid, will redirect Rx to [facility] for dispensing.   Oral Oncology Clinic will continue to follow for insurance authorization, copayment issues, initial counseling and start date.  Leron Croak, PharmD, BCPS, BCOP Hematology/Oncology Clinical Pharmacist [facility] [place] [phone] 09/11/2022 12:20 PM

## 2022-09-11 NOTE — Patient Instructions (Signed)
[facility]  Discharge Instructions: Thank you for choosing [facility] to provide your oncology and hematology care.   If you have a lab appointment with the [facility], please go directly to the [facility] and check in at the registration area.   Wear comfortable clothing and clothing appropriate for easy access to any Portacath or PICC line.   We strive to give you quality time with your provider. You may need to reschedule your appointment if you arrive late (15 or more minutes).  Arriving late affects you and other patients whose appointments are after yours.  Also, if you miss three or more appointments without notifying the office, you may be dismissed from the clinic at the provider's discretion.      For prescription refill requests, have your pharmacy contact our office and allow 72 hours for refills to be completed.    Today you received the following chemotherapy and/or immunotherapy agents; Velcade      To help prevent nausea and vomiting after your treatment, we encourage you to take your nausea medication as directed.  BELOW ARE SYMPTOMS THAT SHOULD BE REPORTED IMMEDIATELY: *FEVER GREATER THAN 100.4 F (38 C) OR HIGHER *CHILLS OR SWEATING *NAUSEA AND VOMITING THAT IS NOT CONTROLLED WITH YOUR NAUSEA MEDICATION *UNUSUAL SHORTNESS OF BREATH *UNUSUAL BRUISING OR BLEEDING *URINARY PROBLEMS (pain or burning when urinating, or frequent urination) *BOWEL PROBLEMS (unusual diarrhea, constipation, pain near the anus) TENDERNESS IN MOUTH AND THROAT WITH OR WITHOUT PRESENCE OF ULCERS (sore throat, sores in mouth, or a toothache) UNUSUAL RASH, SWELLING OR PAIN  UNUSUAL VAGINAL DISCHARGE OR ITCHING   Items with * indicate a potential emergency and should be followed up as soon as possible or go to the Emergency Department if any problems should occur.  Please show the CHEMOTHERAPY ALERT CARD or IMMUNOTHERAPY ALERT CARD at check-in to  the Emergency Department and triage nurse.  Should you have questions after your visit or need to cancel or reschedule your appointment, please contact [facility]  Dept: [phone]  and follow the prompts.  Office hours are 8:00 a.m. to 4:30 p.m. Monday - Friday. Please note that voicemails left after 4:00 p.m. may not be returned until the following business day.  We are closed weekends and major holidays. You have access to a nurse at all times for urgent questions. Please call the main number to the clinic Dept: [phone] and follow the prompts.   For any non-urgent questions, you may also contact your provider using MyChart. We now offer e-Visits for anyone 48 and older to request care online for non-urgent symptoms. For details visit [email or web address].   Also download the MyChart app! Go to the app store, search "MyChart", open the app, select [facility], and log in with your MyChart username and password.  Masks are optional in the cancer centers. If you would like for your care team to wear a mask while they are taking care of you, please let them know. You may have one support person who is at least 86 years old accompany you for your appointments.

## 2022-09-11 NOTE — Telephone Encounter (Signed)
Oral Oncology Patient Advocate Encounter  After completing a benefits investigation, prior authorization for Lenalidomide is not required at this time through [employer].  Patient's copay is $2,343.63.     Zoe [name], CPhT-Adv Oncology Pharmacy Patient [place] Direct Number: [phone]  Fax: [phone]

## 2022-09-11 NOTE — Progress Notes (Signed)
REvlimid enrollment education provided to patient and her daughter. Forms completed and signed. Fax'd to [employer]/[facility] Prescription and copy of forms placed in oral chemo folder. Arvin Collard made aware.

## 2022-09-11 NOTE — Telephone Encounter (Signed)
Oral Oncology Patient Advocate Encounter  Was successful in securing patient a $12,000 grant from [facility] to provide copayment coverage for Lenalidomide.  This will keep the out of pocket expense at $0.     [employer] ID: [id number]  I have spoken with the patient.   The billing information is as follows and has been shared with [facility].    RxBin: [id number] PCN: [id number] Member ID: [id number] Group ID: [id number] Dates of Eligibility: 08/12/22 through 08/12/23  Fund:  [place], CPhT-Adv Oncology Pharmacy Patient [place] Direct Number: [phone]  Fax: [phone]

## 2022-09-11 NOTE — Progress Notes (Signed)
[facility] [phone]   [phone]  PROGRESS NOTE  Patient Care Team: [facility] as PCP - General (Rheumatology)  Hematological/Oncological History # IgG Lambda Multiple Myeloma  06/14/2022: establish care with [name] due to anemia. Labs showed M protein 4.9, Kappa 7.2, Lambda 10.8, ratio 0.67 07/13/2022: Bmbx showed Lambda restricted plasma cell neoplasm involving approximately 60% of the cellular marrow by IHC on the biopsy.  08/01/2022: Cycle 1 Day 1 of VRd chemotherapy. (Holding revlimid initially) 08/21/2022:  Cycle 2 Day 1 of VRd chemotherapy. (Holding revlimid) 09/04/2022: Cycle 3 Day 1 of VRd chemotherapy. (Holding revlimid)  Interval History:  [name] 88 y.o. female with medical history significant for newly diagnosed multiple myeloma who presents for a follow up visit. The patient's last visit was on 08/28/2022. In the interim since the last visit she started Vd chemotherapy.   On exam today Mrs. Brion reports she has been well in the interim since our last visit and she has not had any side effects as result or medication.  She reports that her pain is "a little better today".  She notes that her pain is currently a 6 or 7 out of 10.  She is using a cane and holding her daughter's hand in order to assist with walking.  She reports that she will be going up to [facility] to visit her other children tomorrow.  She reports that she has not been having any trouble with her energy levels.  Her bowels are moving well.  Overall she is willing and able to proceed with treatment at this time.  She denies any fevers, chills, sweats, nausea, vomiting, diarrhea.  Full 10 point ROS was otherwise negative.    MEDICAL HISTORY:  Past Medical History:  Diagnosis Date   Arthritis    some - per patient   Breast cancer ([place])    breast cancer / left    Cataract    bilat    GERD (gastroesophageal reflux disease)     History of kidney stones    Hyperlipidemia    Hypertension    Hypothyroidism    Macular degeneration    Left   S/P TAVR (transcatheter aortic valve replacement) 09/03/2018   23 mm Edwards Sapien 3 transcatheter heart valve placed via percutaneous right transfemoral approach    Severe aortic stenosis    Stress incontinence    Thyroid disease    Tinnitus     SURGICAL HISTORY: Past Surgical History:  Procedure Laterality Date   ABDOMINAL HYSTERECTOMY  1970's   BACK SURGERY     BREAST LUMPECTOMY  12/1998   lumpectomy   CARDIAC CATHETERIZATION     EYE SURGERY     cataract surgery bilat    INTRAOPERATIVE TRANSTHORACIC ECHOCARDIOGRAM N/A 09/03/2018   Procedure: INTRAOPERATIVE TRANSTHORACIC ECHOCARDIOGRAM;  Surgeon: Burnell Blanks, MD;  Location: [place];  Service: Open Heart Surgery;  Laterality: N/A;   KYPHOPLASTY N/A 09/07/2022   Procedure: THORACIC EIGHT KYPHOPLASTY;  Surgeon: Melina Schools, MD;  Location: [place];  Service: Orthopedics;  Laterality: N/A;  1 hr Local with [facility] 3 C-Bed   LITHOTRIPSY     Right total knee     2018 Dr. Alvan Dame   RIGHT/LEFT HEART CATH AND CORONARY ANGIOGRAPHY N/A 08/06/2018   Procedure: RIGHT/LEFT HEART CATH AND CORONARY ANGIOGRAPHY;  Surgeon: Adrian Prows, MD;  Location: [place] [facility];  Service: Cardiovascular;  Laterality: N/A;   THYROIDECTOMY, PARTIAL  1975   TONSILLECTOMY  as a child - patient not sure of exact date   TOTAL KNEE ARTHROPLASTY Left 03/13/2016   Procedure: TOTAL KNEE ARTHROPLASTY;  Surgeon: Paralee Cancel, MD;  Location: [facility];  Service: Orthopedics;  Laterality: Left;   TOTAL KNEE ARTHROPLASTY Right 06/18/2017   Procedure: RIGHT TOTAL KNEE ARTHROPLASTY;  Surgeon: Paralee Cancel, MD;  Location: [facility];  Service: Orthopedics;  Laterality: Right;   TRANSCATHETER AORTIC VALVE REPLACEMENT, TRANSFEMORAL N/A 09/03/2018   Procedure: TRANSCATHETER AORTIC VALVE REPLACEMENT, TRANSFEMORAL;  Surgeon: Burnell Blanks, MD;   Location: [place];  Service: Open Heart Surgery;  Laterality: N/A;    SOCIAL HISTORY: Social History   Socioeconomic History   Marital status: Widowed    Spouse name: Not on file   Number of children: 4   Years of education: Not on file   Highest education level: Master's degree (e.g., MA, MS, MEng, MEd, MSW, MBA)  Occupational History   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  Tobacco Use   Smoking status: Never   Smokeless tobacco: Never  Vaping Use   Vaping Use: Never used  Substance and Sexual Activity   Alcohol use: No   Drug use: No   Sexual activity: Not Currently  Other Topics Concern   Not on file  Social History Narrative   Not on file   Social Determinants of Health   Financial Resource Strain: Low Risk  (11/12/2018)   Overall Financial Resource Strain (CARDIA)    Difficulty of Paying Living Expenses: Not very hard  Food Insecurity: No Food Insecurity (11/12/2018)   Hunger Vital Sign    Worried About Running Out of Food in the Last Year: Never true    Ran Out of Food in the Last Year: Never true  Transportation Needs: No Transportation Needs (11/12/2018)   PRAPARE - Hydrologist (Medical): No    Lack of Transportation (Non-Medical): No  Physical Activity: Inactive (11/12/2018)   Exercise Vital Sign    Days of Exercise per Week: 0 days    Minutes of Exercise per Session: 0 min  Stress: Stress Concern Present (11/12/2018)   [place]    Feeling of Stress : To some extent  Social Connections: Not on file  Intimate Partner Violence: Not on file    FAMILY HISTORY: Family History  Problem Relation Age of Onset   Diabetes Mother    Stroke Mother        Carotid artery disease   Heart disease Father        CAD   Coronary artery disease Father    Diabetes Sister     ALLERGIES:  is allergic to penicillins and sulfa  antibiotics.  MEDICATIONS:  Current Outpatient Medications  Medication Sig Dispense Refill   acyclovir (ZOVIRAX) 400 MG tablet Take 1 tablet (400 mg total) by mouth 2 (two) times daily. 180 tablet 3   Artificial Tear Solution (SOOTHE XP) SOLN Place 1 drop into both eyes every evening.     carvedilol (COREG) 12.5 MG tablet Take 12.5 mg by mouth 2 (two) times daily with a meal.     dapagliflozin propanediol (FARXIGA) 10 MG TABS tablet Take 1 tablet (10 mg total) by mouth daily. 30 tablet 2   esomeprazole (NEXIUM) 20 MG capsule Take 20 mg by mouth daily as needed (Heartburn).     ferrous sulfate 325 (65 FE) MG tablet Take 325 mg by mouth daily with breakfast.  furosemide (LASIX) 20 MG tablet PLEASE SEE ATTACHED FOR DETAILED DIRECTIONS (Patient taking differently: Take 20 mg by mouth daily.) 90 tablet 1   HYDROcodone-acetaminophen (NORCO/VICODIN) 5-325 MG tablet Take 1 tablet by mouth 3 (three) times daily.     lenalidomide (REVLIMID) 25 MG capsule Take 1 capsule (25 mg total) by mouth daily. Take for 14 days, then none for 7 days. Repeat every 21 days. [employer] Auth # [id number]; Date Obtained 09/11/22 14 capsule 0   levothyroxine (SYNTHROID, LEVOTHROID) 100 MCG tablet Take 100 mcg by mouth daily before breakfast.  2   losartan (COZAAR) 25 MG tablet Take 1 tablet (25 mg total) by mouth daily. 90 tablet 1   ondansetron (ZOFRAN) 8 MG tablet Take 1 tablet (8 mg total) by mouth every 8 (eight) hours as needed. 30 tablet 0   potassium chloride (KLOR-CON M) 10 MEQ tablet Take 2 tablets (20 mEq total) by mouth daily as needed. Take with furosemide (Patient taking differently: Take 10 mEq by mouth daily. Take with furosemide) 180 tablet 0   pravastatin (PRAVACHOL) 40 MG tablet Take 40 mg by mouth every evening.     prochlorperazine (COMPAZINE) 10 MG tablet Take 1 tablet (10 mg total) by mouth every 6 (six) hours as needed for nausea or vomiting. 30 tablet 0   No current facility-administered medications  for this visit.    REVIEW OF SYSTEMS:   Constitutional: ( - ) fevers, ( - )  chills , ( - ) night sweats Eyes: ( - ) blurriness of vision, ( - ) double vision, ( - ) watery eyes Ears, nose, mouth, throat, and face: ( - ) mucositis, ( - ) sore throat Respiratory: ( - ) cough, ( - ) dyspnea, ( - ) wheezes Cardiovascular: ( - ) palpitation, ( - ) chest discomfort, ( - ) lower extremity swelling Gastrointestinal:  ( - ) nausea, ( - ) heartburn, ( - ) change in bowel habits Skin: ( - ) abnormal skin rashes Lymphatics: ( - ) new lymphadenopathy, ( - ) easy bruising Neurological: ( - ) numbness, ( - ) tingling, ( - ) new weaknesses Behavioral/Psych: ( - ) mood change, ( - ) new changes  All other systems were reviewed with the patient and are negative.  PHYSICAL EXAMINATION: ECOG PERFORMANCE STATUS: 1 - Symptomatic but completely ambulatory  Vitals:   09/11/22 0912  BP: (!) 127/57  Pulse: 73  Resp: 14  Temp: 97.6 F (36.4 C)  SpO2: 96%    There were no vitals filed for this visit.    GENERAL: well appearing elderly Caucasian female, alert, no distress and comfortable SKIN: skin color, texture, turgor are normal, no rashes or significant lesions EYES: conjunctiva are pink and non-injected, sclera clear LUNGS: clear to auscultation and percussion with normal breathing effort HEART: regular rate & rhythm and no murmurs and no lower extremity edema Musculoskeletal: no cyanosis of digits and no clubbing  PSYCH: alert & oriented x 3, fluent speech NEURO: no focal motor/sensory deficits  LABORATORY DATA:  I have reviewed the data as listed    Latest Ref Rng & Units 09/11/2022    8:47 AM 09/04/2022    8:15 AM 08/28/2022    9:25 AM  CBC  WBC 4.0 - 10.5 K/uL 6.9  6.2  7.4   Hemoglobin 12.0 - 15.0 g/dL 10.6  10.4  10.0   Hematocrit 36.0 - 46.0 % 31.4  32.7  30.4   Platelets 150 - 400 K/uL 172  175  173        Latest Ref Rng & Units 09/11/2022    9:01 AM 09/04/2022    8:15 AM  08/28/2022    9:25 AM  CMP  Glucose 70 - 99 mg/dL 118  99  118   BUN 8 - 23 mg/dL _0 Creatinine 0.44 - 1.00 mg/dL 0.58  0.86  0.84   Sodium 135 - 145 mmol/L 133  132  128   Potassium 3.5 - 5.1 mmol/L 3.9  4.2  4.2   Chloride 98 - 111 mmol/L 98  97  94   CO2 22 - 32 mmol/L _1 Calcium 8.9 - 10.3 mg/dL 8.6  8.3  8.4   Total Protein 6.5 - 8.1 g/dL 8.1  8.5  8.7   Total Bilirubin 0.3 - 1.2 mg/dL 0.5  0.5  0.4   Alkaline Phos 38 - 126 U/L 81  65  66   AST 15 - 41 U/L _2 ALT 0 - 44 U/L _3 Lab Results  Component Value Date   MPROTEIN 3.3 (H) 08/28/2022   MPROTEIN 4.9 (H) 08/01/2022   MPROTEIN 4.9 (H) 06/14/2022   Lab Results  Component Value Date   KPAFRELGTCHN 4.3 08/28/2022   KPAFRELGTCHN 6.3 08/01/2022   KPAFRELGTCHN 7.2 06/14/2022   LAMBDASER 6.2 08/28/2022   LAMBDASER 9.7 08/01/2022   LAMBDASER 10.8 06/14/2022   KAPLAMBRATIO 0.69 08/28/2022   KAPLAMBRATIO 0.65 08/01/2022   KAPLAMBRATIO 7.11 07/01/2022    RADIOGRAPHIC STUDIES: DG Thoracic Spine 2 View  Result Date: 09/07/2022 CLINICAL DATA:  Fluoroscopic assistance for kyphoplasty EXAM: THORACIC SPINE 2 VIEWS COMPARISON:  Thoracic spine radiographs done on 08/21/2022 FINDINGS: Fluoroscopic images show compression fracture in T8 vertebra with interval kyphoplasty. Fluoroscopy time 132 seconds. Radiation dose 31.76 mGy. IMPRESSION: Fluoroscopic assistance was provided for kyphoplasty at T8 level. Electronically Signed   By: Elmer Picker M.D.   On: 09/07/2022 14:03   DG C-Arm 1-60 Min-No Report  Result Date: 09/07/2022 Fluoroscopy was utilized by the requesting physician.  No radiographic interpretation.   DG Thoracic Spine 2 View  Result Date: 08/23/2022 CLINICAL DATA:  Back pain, multiple myeloma EXAM: THORACIC SPINE 2 VIEWS COMPARISON:  06/23/2022 FINDINGS: Frontal and lateral views of the thoracic spine are obtained. Alignment is anatomic. Chronic T12 compression deformity  with prior vertebral augmentation, stable. There is minimal anterior wedging of the T8 vertebral body that has developed in the interim since the prior bone survey, with less than 10% loss of height anteriorly. No other acute bony abnormalities. Paraspinal soft tissues are unremarkable. Aortic valve prosthesis again noted. IMPRESSION: 1. Mild anterior wedging of the T8 vertebral body since prior exam, which could reflect developing anterior compression deformity. Less than 10% loss of height and no retropulsion. 2. No other acute displaced fractures. 3. Chronic T12 compression deformity and prior vertebral augmentation. Electronically Signed   By: Randa Ngo M.D.   On: 08/23/2022 13:41   DG Lumbar Spine 2-3 Views  Result Date: 08/23/2022 CLINICAL DATA:  Multiple myeloma, back pain EXAM: LUMBAR SPINE - 2-3 VIEW COMPARISON:  12/26/2019 FINDINGS: Frontal and lateral views of the lumbar spine are obtained on 3 images. There are 5 non-rib-bearing lumbar type vertebral bodies identified. Grade 1 anterolisthesis of L4 on L5 is unchanged. There is mild degenerative retrolisthesis of L1 on L2 and L2 on L3, also  stable. There are no acute displaced fractures. Multilevel spondylosis and facet hypertrophy throughout the lumbar spine again noted and unchanged, greatest at L1-2, L2-3, and L5-S1. Prior compression deformity and vertebral augmentation at T12. Stable atherosclerosis. IMPRESSION: 1. No acute bony abnormality. 2. Stable multilevel spondylosis and facet hypertrophy. Electronically Signed   By: Randa Ngo M.D.   On: 08/23/2022 13:39    ASSESSMENT & PLAN [name] 88 y.o. female with medical history significant for newly diagnosed multiple myeloma who presents for a follow up visit.   After review of the labs, review the records, discussion with the patient the findings are most consistent with an IgG lambda multiple myeloma.  The patient meets the diagnostic criteria based on 60% plasma cells  in the bone marrow and anemia.  Additionally the patient was noted to have vitamin B12 deficiency which is currently being treated as well.  Given her degree of anemia would recommend proceeding with VD chemotherapy and will add Revlimid once hemoglobin improves.  The patient and her daughter voiced understanding of the plan moving forward.  # IgG Lambda Multiple Myeloma  --diagnosis of MM confirmed with bone marrow biopsy showing 60% plasma cells and anemia -- Bone survey shows no lytic lesions, kidney function is within normal limits.  --08/01/2022 was Cycle 1 Day 1 of Vd  Plan:  --Today is Cycle 3 Day 8 of Vd chemotherapy. Will add Revlimid as her Hgb has improved --Labs show white blood cell count 6.9, hemoglobin 10.6, MCV 84.9, and platelets of 172.  Creatinine is 0.58 and LFTs are within normal limits. --at each visit will collect CBC, CMP, and LDH with monthly restaging labs SPEP and SFLC.  --RTC in 2 weeks with interval weekly treatment.   # Normocytic Anemia #Vitamin B12 Deficiency --Anemia likely driven by multiple myeloma, but may be component of vitamin B12 deficiency as well. --initial labs showed elevated MMA with B12 180 -- Continue vitamin B12 1000 mcg p.o. daily -- Continue to monitor  #Supportive Care -- chemotherapy education complete -- port placement not required.  --We will discuss starting Zometa/Xgeva at next clinic visit. -- zofran 35m q8H PRN and compazine 131mPO q6H for nausea -- acyclovir 40078mO BID for VCZ prophylaxis -- tylenol 1000 mg q8H PRN for back pain.   Orders Placed This Encounter  Procedures   MR LIVER W WO CONTRAST    Standing Status:   Future    Standing Expiration Date:   09/12/2023    Order Specific Question:   If indicated for the ordered procedure, I authorize the administration of contrast media per Radiology protocol    Answer:   Yes    Order Specific Question:   What is the patient's sedation requirement?    Answer:   No Sedation     Order Specific Question:   Does the patient have a pacemaker or implanted devices?    Answer:   No    Order Specific Question:   [facility]    [facility]    All questions were answered. The patient knows to call the clinic with any problems, questions or concerns.  A total of more than 30 minutes were spent on this encounter with face-to-face time and non-face-to-face time, including preparing to see the patient, ordering tests and/or medications, counseling the patient and coordination of care as outlined above.   JohLedell PeoplesD Department of Hematology/Oncology [facility]  at [facility] Phone: [phone] Pager: [phone] Email: [email or web address]  09/11/2022 2:06 PM

## 2022-09-13 LAB — SURGICAL PATHOLOGY

## 2022-09-13 NOTE — Telephone Encounter (Signed)
Oral Chemotherapy Pharmacist Encounter   Attempted to reach patient to provide update and offer for initial counseling on oral medication: Revlimid (lenalidomide).   Notified by [facility] that prescription is ready to set up shipment, but they have been unable to reach patient.   Attempted to reach patient on both home and mobile phone numbers. No answer. Left voicemail for patient to call back to discuss details of medication acquisition and initial counseling session.  Leron Croak, PharmD, BCPS, BCOP Hematology/Oncology Clinical Pharmacist [facility] and [place] [phone] 09/13/2022 11:13 AM

## 2022-09-15 NOTE — Telephone Encounter (Signed)
Oral Chemotherapy Pharmacist Encounter  I spoke with patient for overview of: Revlimid for the treatment of multiple myeloma in conjunction with bortezomib and dexamethasone, planned duration until disease progression or unacceptable drug toxicity.   Counseled patient on administration, dosing, side effects, monitoring, drug-food interactions, safe handling, storage, and disposal.  Patient will take Revlimid 63m capsules, 1 capsule by mouth once daily, without regard to food, with a full glass of water.  Revlimid will be given 14 days on, 7 days off, repeat every 21 days.  Revlimid start date: 09/18/22 PM  Adverse effects of Revlimid include but are not limited to: nausea, constipation, diarrhea, abdominal pain, rash, fatigue, and decreased blood counts.    Reviewed with patient importance of keeping a medication schedule and plan for any missed doses. No barriers to medication adherence identified.  Medication reconciliation performed and medication/allergy list updated.  Patient endorses taking acyclovir and dexamethasone. Patient counseled on importance of daily aspirin 867mfor VTE prophylaxis. Patient endorses already taking a daily baby aspirin.   Insurance authorization for Revlimid has been obtained.  Revlimid prescription is being dispensed from [facility] it is a limited distribution medication.  All questions answered.  Ms. BrRojekoiced understanding and appreciation.   Medication education handout placed in mail for patient. Patient knows to call the office with questions or concerns. Oral Chemotherapy Clinic phone number provided to patient.   ReLeron CroakPharmD, BCPS, BCTristar Greenview Regional Hospitalematology/Oncology Clinical Pharmacist [facility] [phone]/24/2023 10:36 AM

## 2022-09-18 ENCOUNTER — Other Ambulatory Visit: Payer: Medicare Other

## 2022-09-18 ENCOUNTER — Ambulatory Visit: Payer: Medicare Other

## 2022-09-18 ENCOUNTER — Ambulatory Visit: Payer: Medicare Other | Admitting: Cardiology

## 2022-09-19 ENCOUNTER — Inpatient Hospital Stay: Payer: Medicare Other

## 2022-09-19 ENCOUNTER — Other Ambulatory Visit: Payer: Self-pay

## 2022-09-19 VITALS — BP 170/80 | HR 79 | Temp 98.0°F | Resp 17 | Wt 153.5 lb

## 2022-09-19 DIAGNOSIS — D649 Anemia, unspecified: Secondary | ICD-10-CM | POA: Diagnosis not present

## 2022-09-19 DIAGNOSIS — C9000 Multiple myeloma not having achieved remission: Secondary | ICD-10-CM

## 2022-09-19 DIAGNOSIS — Z5112 Encounter for antineoplastic immunotherapy: Secondary | ICD-10-CM | POA: Diagnosis not present

## 2022-09-19 DIAGNOSIS — E538 Deficiency of other specified B group vitamins: Secondary | ICD-10-CM | POA: Diagnosis not present

## 2022-09-19 LAB — CMP (CANCER CENTER ONLY)
ALT: 16 U/L (ref 0–44)
AST: 20 U/L (ref 15–41)
Albumin: 3.6 g/dL (ref 3.5–5.0)
Alkaline Phosphatase: 88 U/L (ref 38–126)
Anion gap: 9 (ref 5–15)
BUN: 16 mg/dL (ref 8–23)
CO2: 26 mmol/L (ref 22–32)
Calcium: 9 mg/dL (ref 8.9–10.3)
Chloride: 94 mmol/L — ABNORMAL LOW (ref 98–111)
Creatinine: 0.76 mg/dL (ref 0.44–1.00)
GFR, Estimated: >60 mL/min (ref >60)
Glucose, Bld: 178 mg/dL — ABNORMAL HIGH (ref 70–99)
Potassium: 4 mmol/L (ref 3.5–5.1)
Sodium: 129 mmol/L — ABNORMAL LOW (ref 135–145)
Total Bilirubin: 0.6 mg/dL (ref 0.3–1.2)
Total Protein: 9.2 g/dL — ABNORMAL HIGH (ref 6.5–8.1)

## 2022-09-19 LAB — CBC WITH DIFFERENTIAL (CANCER CENTER ONLY)
Abs Immature Granulocytes: 0.04 10*3/uL (ref 0.00–0.07)
Basophils Absolute: 0 10*3/uL (ref 0.0–0.1)
Basophils Relative: 0 %
Eosinophils Absolute: 0 10*3/uL (ref 0.0–0.5)
Eosinophils Relative: 0 %
HCT: 35.5 % — ABNORMAL LOW (ref 36.0–46.0)
Hemoglobin: 11.7 g/dL — ABNORMAL LOW (ref 12.0–15.0)
Immature Granulocytes: 0 %
Lymphocytes Relative: 5 %
Lymphs Abs: 0.5 10*3/uL — ABNORMAL LOW (ref 0.7–4.0)
MCH: 27.9 pg (ref 26.0–34.0)
MCHC: 33 g/dL (ref 30.0–36.0)
MCV: 84.5 fL (ref 80.0–100.0)
Monocytes Absolute: 0.1 10*3/uL (ref 0.1–1.0)
Monocytes Relative: 1 %
Neutro Abs: 9.9 10*3/uL — ABNORMAL HIGH (ref 1.7–7.7)
Neutrophils Relative %: 94 %
Platelet Count: 267 10*3/uL (ref 150–400)
RBC: 4.2 MIL/uL (ref 3.87–5.11)
RDW: 19.7 % — ABNORMAL HIGH (ref 11.5–15.5)
WBC Count: 10.6 10*3/uL — ABNORMAL HIGH (ref 4.0–10.5)
nRBC: 0 % (ref 0.0–0.2)

## 2022-09-19 MED ORDER — BORTEZOMIB CHEMO SQ INJECTION 3.5 MG (2.5MG/ML)
1.3000 mg/m2 | Freq: Once | INTRAMUSCULAR | Status: AC
  Administered 2022-09-19: 2.25 mg via SUBCUTANEOUS
  Filled 2022-09-19: qty 0.9

## 2022-09-19 NOTE — Patient Instructions (Signed)
  [facility]  Discharge Instructions: Thank you for choosing [facility] to provide your oncology and hematology care.   If you have a lab appointment with the [facility], please go directly to the [facility] and check in at the registration area.   Wear comfortable clothing and clothing appropriate for easy access to any Portacath or PICC line.   We strive to give you quality time with your provider. You may need to reschedule your appointment if you arrive late (15 or more minutes).  Arriving late affects you and other patients whose appointments are after yours.  Also, if you miss three or more appointments without notifying the office, you may be dismissed from the clinic at the provider's discretion.      For prescription refill requests, have your pharmacy contact our office and allow 72 hours for refills to be completed.    Today you received the following chemotherapy and/or immunotherapy agents: Velcade   To help prevent nausea and vomiting after your treatment, we encourage you to take your nausea medication as directed.  BELOW ARE SYMPTOMS THAT SHOULD BE REPORTED IMMEDIATELY: *FEVER GREATER THAN 100.4 F (38 C) OR HIGHER *CHILLS OR SWEATING *NAUSEA AND VOMITING THAT IS NOT CONTROLLED WITH YOUR NAUSEA MEDICATION *UNUSUAL SHORTNESS OF BREATH *UNUSUAL BRUISING OR BLEEDING *URINARY PROBLEMS (pain or burning when urinating, or frequent urination) *BOWEL PROBLEMS (unusual diarrhea, constipation, pain near the anus) TENDERNESS IN MOUTH AND THROAT WITH OR WITHOUT PRESENCE OF ULCERS (sore throat, sores in mouth, or a toothache) UNUSUAL RASH, SWELLING OR PAIN  UNUSUAL VAGINAL DISCHARGE OR ITCHING   Items with * indicate a potential emergency and should be followed up as soon as possible or go to the Emergency Department if any problems should occur.  Please show the CHEMOTHERAPY ALERT CARD or IMMUNOTHERAPY ALERT CARD at check-in to the  Emergency Department and triage nurse.  Should you have questions after your visit or need to cancel or reschedule your appointment, please contact [facility]  Dept: [phone]  and follow the prompts.  Office hours are 8:00 a.m. to 4:30 p.m. Monday - Friday. Please note that voicemails left after 4:00 p.m. may not be returned until the following business day.  We are closed weekends and major holidays. You have access to a nurse at all times for urgent questions. Please call the main number to the clinic Dept: [phone] and follow the prompts.   For any non-urgent questions, you may also contact your provider using MyChart. We now offer e-Visits for anyone 29 and older to request care online for non-urgent symptoms. For details visit [email or web address].   Also download the MyChart app! Go to the app store, search "MyChart", open the app, select [facility], and log in with your MyChart username and password.  Masks are optional in the cancer centers. If you would like for your care team to wear a mask while they are taking care of you, please let them know. You may have one support person who is at least 86 years old accompany you for your appointments.

## 2022-09-20 ENCOUNTER — Ambulatory Visit: Payer: Medicare Other | Admitting: Physician Assistant

## 2022-09-20 ENCOUNTER — Other Ambulatory Visit: Payer: Medicare Other

## 2022-09-20 DIAGNOSIS — Z4889 Encounter for other specified surgical aftercare: Secondary | ICD-10-CM | POA: Diagnosis not present

## 2022-09-21 ENCOUNTER — Ambulatory Visit: Payer: Medicare Other

## 2022-09-21 DIAGNOSIS — I6523 Occlusion and stenosis of bilateral carotid arteries: Secondary | ICD-10-CM | POA: Diagnosis not present

## 2022-09-23 ENCOUNTER — Ambulatory Visit (HOSPITAL_COMMUNITY)
Admission: RE | Admit: 2022-09-23 | Discharge: 2022-09-23 | Disposition: A | Discharge status: Home / Self Care | Payer: Medicare Other | Source: Ambulatory Visit | Attending: Hematology and Oncology | Admitting: Hematology and Oncology

## 2022-09-23 DIAGNOSIS — K769 Liver disease, unspecified: Secondary | ICD-10-CM | POA: Insufficient documentation

## 2022-09-23 DIAGNOSIS — K862 Cyst of pancreas: Secondary | ICD-10-CM | POA: Diagnosis not present

## 2022-09-23 DIAGNOSIS — D1803 Hemangioma of intra-abdominal structures: Secondary | ICD-10-CM | POA: Diagnosis not present

## 2022-09-23 DIAGNOSIS — N289 Disorder of kidney and ureter, unspecified: Secondary | ICD-10-CM | POA: Diagnosis not present

## 2022-09-23 MED ORDER — GADOBUTROL 1 MMOL/ML IV SOLN
7.0000 mL | Freq: Once | INTRAVENOUS | Status: AC | PRN
  Administered 2022-09-23: 7 mL via INTRAVENOUS

## 2022-09-25 ENCOUNTER — Inpatient Hospital Stay: Payer: Medicare Other

## 2022-09-25 ENCOUNTER — Inpatient Hospital Stay: Payer: Medicare Other | Attending: Physician Assistant

## 2022-09-25 ENCOUNTER — Other Ambulatory Visit: Payer: Self-pay

## 2022-09-25 ENCOUNTER — Inpatient Hospital Stay: Payer: Medicare Other | Admitting: Hematology and Oncology

## 2022-09-25 VITALS — BP 100/52 | HR 67 | Temp 97.9°F | Resp 14 | Wt 159.1 lb

## 2022-09-25 DIAGNOSIS — C9000 Multiple myeloma not having achieved remission: Secondary | ICD-10-CM

## 2022-09-25 DIAGNOSIS — D649 Anemia, unspecified: Secondary | ICD-10-CM | POA: Insufficient documentation

## 2022-09-25 DIAGNOSIS — Z5112 Encounter for antineoplastic immunotherapy: Secondary | ICD-10-CM | POA: Insufficient documentation

## 2022-09-25 DIAGNOSIS — E538 Deficiency of other specified B group vitamins: Secondary | ICD-10-CM | POA: Insufficient documentation

## 2022-09-25 LAB — CBC WITH DIFFERENTIAL (CANCER CENTER ONLY)
Abs Immature Granulocytes: 0.03 10*3/uL (ref 0.00–0.07)
Basophils Absolute: 0 10*3/uL (ref 0.0–0.1)
Basophils Relative: 0 %
Eosinophils Absolute: 0.1 10*3/uL (ref 0.0–0.5)
Eosinophils Relative: 2 %
HCT: 32.5 % — ABNORMAL LOW (ref 36.0–46.0)
Hemoglobin: 10.9 g/dL — ABNORMAL LOW (ref 12.0–15.0)
Immature Granulocytes: 0 %
Lymphocytes Relative: 7 %
Lymphs Abs: 0.6 10*3/uL — ABNORMAL LOW (ref 0.7–4.0)
MCH: 28.2 pg (ref 26.0–34.0)
MCHC: 33.5 g/dL (ref 30.0–36.0)
MCV: 84.2 fL (ref 80.0–100.0)
Monocytes Absolute: 0.6 10*3/uL (ref 0.1–1.0)
Monocytes Relative: 7 %
Neutro Abs: 6.9 10*3/uL (ref 1.7–7.7)
Neutrophils Relative %: 84 %
Platelet Count: 179 10*3/uL (ref 150–400)
RBC: 3.86 MIL/uL — ABNORMAL LOW (ref 3.87–5.11)
RDW: 19.4 % — ABNORMAL HIGH (ref 11.5–15.5)
WBC Count: 8.3 10*3/uL (ref 4.0–10.5)
nRBC: 0 % (ref 0.0–0.2)

## 2022-09-25 LAB — CMP (CANCER CENTER ONLY)
ALT: 11 U/L (ref 0–44)
AST: 16 U/L (ref 15–41)
Albumin: 3.3 g/dL — ABNORMAL LOW (ref 3.5–5.0)
Alkaline Phosphatase: 67 U/L (ref 38–126)
Anion gap: 7 (ref 5–15)
BUN: 18 mg/dL (ref 8–23)
CO2: 28 mmol/L (ref 22–32)
Calcium: 8.6 mg/dL — ABNORMAL LOW (ref 8.9–10.3)
Chloride: 96 mmol/L — ABNORMAL LOW (ref 98–111)
Creatinine: 0.74 mg/dL (ref 0.44–1.00)
GFR, Estimated: >60 mL/min (ref >60)
Glucose, Bld: 134 mg/dL — ABNORMAL HIGH (ref 70–99)
Potassium: 3.7 mmol/L (ref 3.5–5.1)
Sodium: 131 mmol/L — ABNORMAL LOW (ref 135–145)
Total Bilirubin: 0.6 mg/dL (ref 0.3–1.2)
Total Protein: 7.5 g/dL (ref 6.5–8.1)

## 2022-09-25 MED ORDER — BORTEZOMIB CHEMO SQ INJECTION 3.5 MG (2.5MG/ML)
1.3000 mg/m2 | Freq: Once | INTRAMUSCULAR | Status: AC
  Administered 2022-09-25: 2.25 mg via SUBCUTANEOUS
  Filled 2022-09-25: qty 0.9

## 2022-09-25 NOTE — Progress Notes (Signed)
Patient took dexamethasone at home around 7am.

## 2022-09-25 NOTE — Progress Notes (Signed)
[place] Telephone:([phone]) [phone]   Fax:([phone]) [phone]  PROGRESS NOTE  Patient Care Team: [facility] as PCP - General (Rheumatology)  Hematological/Oncological History # IgG Lambda Multiple Myeloma  06/14/2022: establish care with [name] due to anemia. Labs showed M protein 4.9, Kappa 7.2, Lambda 10.8, ratio 0.67 07/13/2022: [facility] showed Lambda restricted plasma cell neoplasm involving approximately 60% of the cellular marrow by IHC on the biopsy.  08/01/2022: Cycle 1 Day 1 of VRd chemotherapy. (Holding revlimid initially) 08/21/2022:  Cycle 2 Day 1 of VRd chemotherapy. (Holding revlimid) 09/04/2022: Cycle 3 Day 1 of VRd chemotherapy. (Holding revlimid)  Interval History:  [name] 88 y.o. female with medical history significant for newly diagnosed multiple myeloma who presents for a follow up visit. The patient's last visit was on 09/11/2022. In the interim since the last visit she has continued Vd chemotherapy.   On exam today Mrs. Myrick reports she has continued to have pain in her back.  She is currently taking her hydrocodone twice daily once in the morning and once at night.  She reports that she was prescribed meloxicam but has not been taking it.  She reports that she "takes so many medications".  She did receive the relevant chemotherapy pills 25 mg and start taking those on Tuesday.  She reports that she has a little bit of "itching" and has been rubbing the skin on her chest and hair.  She notes that she is not having any trouble with nausea, vomiting, or diarrhea though she did have 1 loose stool yesterday.  She has bowel movements every other day and does take an occasional stool softener.  She is not having any numbness or tingling of her fingers or toes.  Overall she is eating well.  She is also set up for physical therapy to start on Wednesday.  Overall she is willing and able to proceed with treatment at this time.  She  denies any fevers, chills, sweats, nausea, vomiting, diarrhea.  Full 10 point ROS was otherwise negative.    MEDICAL HISTORY:  Past Medical History:  Diagnosis Date   Arthritis    some - per patient   Breast cancer ([place])    breast cancer / left    Cataract    bilat    GERD (gastroesophageal reflux disease)    History of kidney stones    Hyperlipidemia    Hypertension    Hypothyroidism    Macular degeneration    Left   S/P TAVR (transcatheter aortic valve replacement) 09/03/2018   23 mm Edwards Sapien 3 transcatheter heart valve placed via percutaneous right transfemoral approach    Severe aortic stenosis    Stress incontinence    Thyroid disease    Tinnitus     SURGICAL HISTORY: Past Surgical History:  Procedure Laterality Date   ABDOMINAL HYSTERECTOMY  1970's   BACK SURGERY     BREAST LUMPECTOMY  12/1998   lumpectomy   CARDIAC CATHETERIZATION     EYE SURGERY     cataract surgery bilat    INTRAOPERATIVE TRANSTHORACIC ECHOCARDIOGRAM N/A 09/03/2018   Procedure: INTRAOPERATIVE TRANSTHORACIC ECHOCARDIOGRAM;  Surgeon: Burnell Blanks, MD;  Location: [facility];  Service: Open Heart Surgery;  Laterality: N/A;   KYPHOPLASTY N/A 09/07/2022   Procedure: THORACIC EIGHT KYPHOPLASTY;  Surgeon: Melina Schools, MD;  Location: [facility];  Service: Orthopedics;  Laterality: N/A;  1 hr Local with [facility] 3 C-Bed   LITHOTRIPSY     Right total  knee     2018 Dr. Alvan Dame   RIGHT/LEFT HEART CATH AND CORONARY ANGIOGRAPHY N/A 08/06/2018   Procedure: RIGHT/LEFT HEART CATH AND CORONARY ANGIOGRAPHY;  Surgeon: Adrian Prows, MD;  Location: [facility];  Service: Cardiovascular;  Laterality: N/A;   THYROIDECTOMY, PARTIAL  1975   TONSILLECTOMY     as a child - patient not sure of exact date   TOTAL KNEE ARTHROPLASTY Left 03/13/2016   Procedure: TOTAL KNEE ARTHROPLASTY;  Surgeon: Paralee Cancel, MD;  Location: [facility];  Service: Orthopedics;  Laterality: Left;   TOTAL KNEE ARTHROPLASTY Right  06/18/2017   Procedure: RIGHT TOTAL KNEE ARTHROPLASTY;  Surgeon: Paralee Cancel, MD;  Location: [facility];  Service: Orthopedics;  Laterality: Right;   TRANSCATHETER AORTIC VALVE REPLACEMENT, TRANSFEMORAL N/A 09/03/2018   Procedure: TRANSCATHETER AORTIC VALVE REPLACEMENT, TRANSFEMORAL;  Surgeon: Burnell Blanks, MD;  Location: [facility];  Service: Open Heart Surgery;  Laterality: N/A;    SOCIAL HISTORY: Social History   Socioeconomic History   Marital status: Widowed    Spouse name: Not on file   Number of children: 4   Years of education: Not on file   Highest education level: Master's degree (e.g., MA, MS, MEng, MEd, MSW, MBA)  Occupational History   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  Tobacco Use   Smoking status: Never   Smokeless tobacco: Never  Vaping Use   Vaping Use: Never used  Substance and Sexual Activity   Alcohol use: No   Drug use: No   Sexual activity: Not Currently  Other Topics Concern   Not on file  Social History Narrative   Not on file   Social Determinants of Health   Financial Resource Strain: Low Risk  (11/12/2018)   Overall Financial Resource Strain (CARDIA)    Difficulty of Paying Living Expenses: Not very hard  Food Insecurity: No Food Insecurity (11/12/2018)   Hunger Vital Sign    Worried About Running Out of Food in the Last Year: Never true    Ran Out of Food in the Last Year: Never true  Transportation Needs: No Transportation Needs (11/12/2018)   PRAPARE - Hydrologist (Medical): No    Lack of Transportation (Non-Medical): No  Physical Activity: Inactive (11/12/2018)   Exercise Vital Sign    Days of Exercise per Week: 0 days    Minutes of Exercise per Session: 0 min  Stress: Stress Concern Present (11/12/2018)   [place]    Feeling of Stress : To some extent  Social Connections: Not on file  Intimate Partner  Violence: Not on file    FAMILY HISTORY: Family History  Problem Relation Age of Onset   Diabetes Mother    Stroke Mother        Carotid artery disease   Heart disease Father        CAD   Coronary artery disease Father    Diabetes Sister     ALLERGIES:  is allergic to penicillins and sulfa antibiotics.  MEDICATIONS:  Current Outpatient Medications  Medication Sig Dispense Refill   Cholecalciferol (VITAMIN D3) 50 MCG (2000 UT) capsule Take 1 capsule (2,000 Units total) by mouth daily.     acyclovir (ZOVIRAX) 400 MG tablet Take 1 tablet (400 mg total) by mouth 2 (two) times daily. 180 tablet 3   Artificial Tear Solution (SOOTHE XP) SOLN Place 1 drop into both eyes every evening.  aspirin EC 81 MG tablet Take 81 mg by mouth daily. Swallow whole.     calcitonin, salmon, (MIACALCIN/FORTICAL) 200 UNIT/ACT nasal spray Place 1 spray into alternate nostrils daily.     carvedilol (COREG) 12.5 MG tablet Take 12.5 mg by mouth 2 (two) times daily with a meal.     cyanocobalamin (VITAMIN B12) 1000 MCG tablet Take 1,000 mcg by mouth daily.     dapagliflozin propanediol (FARXIGA) 10 MG TABS tablet Take 1 tablet (10 mg total) by mouth daily. 30 tablet 2   dexamethasone (DECADRON) 4 MG tablet Take 20 mg by mouth daily. Take 20 mg (5 tablets) on day of multiple myeloma treatment     esomeprazole (NEXIUM) 20 MG capsule Take 20 mg by mouth daily as needed (Heartburn).     ferrous sulfate 325 (65 FE) MG tablet Take 325 mg by mouth daily with breakfast.     furosemide (LASIX) 20 MG tablet PLEASE SEE ATTACHED FOR DETAILED DIRECTIONS (Patient taking differently: Take 20 mg by mouth daily.) 90 tablet 1   HYDROcodone-acetaminophen (NORCO/VICODIN) 5-325 MG tablet Take 1 tablet by mouth 3 (three) times daily.     lenalidomide (REVLIMID) 25 MG capsule Take 1 capsule (25 mg total) by mouth daily. Take for 14 days, then none for 7 days. Repeat every 21 days. [employer] Auth # [id number]; Date Obtained 09/11/22 14  capsule 0   levothyroxine (SYNTHROID, LEVOTHROID) 100 MCG tablet Take 100 mcg by mouth daily before breakfast.  2   losartan (COZAAR) 25 MG tablet Take 1 tablet (25 mg total) by mouth daily. 90 tablet 1   ondansetron (ZOFRAN) 8 MG tablet Take 1 tablet (8 mg total) by mouth every 8 (eight) hours as needed. 30 tablet 0   potassium chloride (KLOR-CON M) 10 MEQ tablet Take 2 tablets (20 mEq total) by mouth daily as needed. Take with furosemide (Patient taking differently: Take 10 mEq by mouth daily. Take with furosemide) 180 tablet 0   pravastatin (PRAVACHOL) 40 MG tablet Take 40 mg by mouth every evening.     prochlorperazine (COMPAZINE) 10 MG tablet Take 1 tablet (10 mg total) by mouth every 6 (six) hours as needed for nausea or vomiting. 30 tablet 0   No current facility-administered medications for this visit.    REVIEW OF SYSTEMS:   Constitutional: ( - ) fevers, ( - )  chills , ( - ) night sweats Eyes: ( - ) blurriness of vision, ( - ) double vision, ( - ) watery eyes Ears, nose, mouth, throat, and face: ( - ) mucositis, ( - ) sore throat Respiratory: ( - ) cough, ( - ) dyspnea, ( - ) wheezes Cardiovascular: ( - ) palpitation, ( - ) chest discomfort, ( - ) lower extremity swelling Gastrointestinal:  ( - ) nausea, ( - ) heartburn, ( - ) change in bowel habits Skin: ( - ) abnormal skin rashes Lymphatics: ( - ) new lymphadenopathy, ( - ) easy bruising Neurological: ( - ) numbness, ( - ) tingling, ( - ) new weaknesses Behavioral/Psych: ( - ) mood change, ( - ) new changes  All other systems were reviewed with the patient and are negative.  PHYSICAL EXAMINATION: ECOG PERFORMANCE STATUS: 1 - Symptomatic but completely ambulatory  Vitals:   09/25/22 0834  BP: (!) 100/52  Pulse: 67  Resp: 14  Temp: 97.9 F (36.6 C)  SpO2: 96%     Filed Weights   09/25/22 0834  Weight: 159 lb 1.6 oz (72.2  kg)      GENERAL: well appearing elderly Caucasian female, alert, no distress and  comfortable SKIN: skin color, texture, turgor are normal, no rashes or significant lesions EYES: conjunctiva are pink and non-injected, sclera clear LUNGS: clear to auscultation and percussion with normal breathing effort HEART: regular rate & rhythm and no murmurs and no lower extremity edema Musculoskeletal: no cyanosis of digits and no clubbing  PSYCH: alert & oriented x 3, fluent speech NEURO: no focal motor/sensory deficits  LABORATORY DATA:  I have reviewed the data as listed    Latest Ref Rng & Units 09/25/2022    8:10 AM 09/19/2022    1:33 PM 09/11/2022    8:47 AM  CBC  WBC 4.0 - 10.5 K/uL 8.3  10.6  6.9   Hemoglobin 12.0 - 15.0 g/dL 10.9  11.7  10.6   Hematocrit 36.0 - 46.0 % 32.5  35.5  31.4   Platelets 150 - 400 K/uL 179  267  172        Latest Ref Rng & Units 09/25/2022    8:10 AM 09/19/2022    1:33 PM 09/11/2022    9:01 AM  CMP  Glucose 70 - 99 mg/dL 134  178  118   BUN 8 - 23 mg/dL _0 Creatinine 0.44 - 1.00 mg/dL 0.74  0.76  0.58   Sodium 135 - 145 mmol/L 131  129  133   Potassium 3.5 - 5.1 mmol/L 3.7  4.0  3.9   Chloride 98 - 111 mmol/L 96  94  98   CO2 22 - 32 mmol/L _1 Calcium 8.9 - 10.3 mg/dL 8.6  9.0  8.6   Total Protein 6.5 - 8.1 g/dL 7.5  9.2  8.1   Total Bilirubin 0.3 - 1.2 mg/dL 0.6  0.6  0.5   Alkaline Phos 38 - 126 U/L 67  88  81   AST 15 - 41 U/L _2 ALT 0 - 44 U/L _3 Lab Results  Component Value Date   MPROTEIN 3.3 (H) 08/28/2022   MPROTEIN 4.9 (H) 08/01/2022   MPROTEIN 4.9 (H) 06/14/2022   Lab Results  Component Value Date   KPAFRELGTCHN 4.3 08/28/2022   KPAFRELGTCHN 6.3 08/01/2022   KPAFRELGTCHN 7.2 06/14/2022   LAMBDASER 6.2 08/28/2022   LAMBDASER 9.7 08/01/2022   LAMBDASER 10.8 06/14/2022   KAPLAMBRATIO 0.69 08/28/2022   KAPLAMBRATIO 0.65 08/01/2022   KAPLAMBRATIO 7.11 07/01/2022    RADIOGRAPHIC STUDIES: PCV CAROTID DUPLEX (BILATERAL)  Result Date: 09/24/2022 Carotid artery duplex  09/21/2022: Duplex suggests stenosis in the right internal carotid artery (1-15%). Duplex suggests stenosis in the left internal carotid artery (16-49%). Antegrade right vertebral artery flow. Antegrade left vertebral artery flow. Compared to the study done on 02/15/2022, left ICA stenosis of >50% has now regressed.  Otherwise no significant change.  There is mild to moderate heterogenous plaque noted in the bilateral carotid arteries. Follow up in one year is appropriate if clinically indicated.   MR LIVER W WO CONTRAST  Result Date: 09/24/2022 CLINICAL DATA:  88 year old female with history of liver lesion. Follow-up study. EXAM: MRI ABDOMEN WITHOUT AND WITH CONTRAST TECHNIQUE: Multiplanar multisequence MR imaging of the abdomen was performed both before and after the administration of intravenous contrast. CONTRAST:  38m GADAVIST GADOBUTROL 1 MMOL/ML IV SOLN COMPARISON:  Abdominal MRI 08/09/2006. CTA of the chest, abdomen  and pelvis 08/15/2018. FINDINGS: Lower chest: Susceptibility artifact in the region of the aortic root, presumably from prior TAVR procedure. Hepatobiliary: In the right lobe of the liver centered predominantly in segment 8 (axial image 12 of series 6 and coronal image 12 of series 4) there is a T1 hypointense, T2 hyperintense lesion which demonstrates early peripheral nodular hyperenhancement with progressive centripetal filling on more delayed images, diagnostic of a cavernous hemangioma. No other suspicious appearing hepatic lesions are noted. No intra or extrahepatic biliary ductal dilatation. Common bile duct measures 5 mm in the porta hepatis. Gallbladder is normal in appearance. Pancreas: A few scattered tiny T1 hypointense, T2 hyperintense, nonenhancing lesions are noted throughout the body and tail of the pancreas measuring 4 mm or less in size (axial image 19 of series 6). These do not definitively communicate with the main pancreatic duct. Main pancreatic duct is normal in caliber.  No solid pancreatic mass. No peripancreatic fluid collections or inflammatory changes. Spleen:  Unremarkable. Adrenals/Urinary Tract: Tiny subcentimeter T1 hypointense, T2 hyperintense, nonenhancing lesions in the right kidney are compatible with tiny simple cysts (Bosniak class 1, no imaging follow-up recommended). Left kidney and bilateral adrenal glands are otherwise normal in appearance. No hydroureteronephrosis in the visualized portions of the abdomen. Stomach/Bowel: Visualized portions are unremarkable. Vascular/Lymphatic: No aneurysm identified in the visualized abdominal vasculature. No lymphadenopathy noted in the abdomen. Other: No significant volume of ascites noted in the visualized portions of the peritoneal cavity. Musculoskeletal: No aggressive appearing osseous lesions are noted in the visualized portions of the skeleton. IMPRESSION: 1. Large cavernous hemangioma in the liver, very similar to prior examinations, as above. No aggressive lesions are noted. 2. Multiple tiny cystic appearing lesions in the pancreas measuring 4 mm or less in size in the body and tail, nonspecific, but statistically likely benign lesions such as small pancreatic pseudocysts or side branch IPMNs (intraductal papillary mucinous neoplasms). Repeat abdominal MRI with and without IV gadolinium with MRCP (or pancreatic protocol CT scan) is recommended in 2 years to ensure the stability or regression of these findings. This recommendation follows ACR consensus guidelines: Management of Incidental Pancreatic Cysts: A White Paper of the ACR Incidental Findings Committee. [place] [id number];29:924-268. 3. Additional incidental findings, as above. Electronically Signed   By: Vinnie Langton M.D.   On: 09/24/2022 05:59   DG Thoracic Spine 2 View  Result Date: 09/07/2022 CLINICAL DATA:  Fluoroscopic assistance for kyphoplasty EXAM: THORACIC SPINE 2 VIEWS COMPARISON:  Thoracic spine radiographs done on 08/21/2022 FINDINGS:  Fluoroscopic images show compression fracture in T8 vertebra with interval kyphoplasty. Fluoroscopy time 132 seconds. Radiation dose 31.76 mGy. IMPRESSION: Fluoroscopic assistance was provided for kyphoplasty at T8 level. Electronically Signed   By: Elmer Picker M.D.   On: 09/07/2022 14:03   DG C-Arm 1-60 Min-No Report  Result Date: 09/07/2022 Fluoroscopy was utilized by the requesting physician.  No radiographic interpretation.    ASSESSMENT & PLAN [name] 88 y.o. female with medical history significant for newly diagnosed multiple myeloma who presents for a follow up visit.   After review of the labs, review the records, discussion with the patient the findings are most consistent with an IgG lambda multiple myeloma.  The patient meets the diagnostic criteria based on 60% plasma cells in the bone marrow and anemia.  Additionally the patient was noted to have vitamin B12 deficiency which is currently being treated as well.  Given her degree of anemia would recommend proceeding with VD chemotherapy and  will add Revlimid once hemoglobin improves.  The patient and her daughter voiced understanding of the plan moving forward.  # IgG Lambda Multiple Myeloma  --diagnosis of MM confirmed with bone marrow biopsy showing 60% plasma cells and anemia -- Bone survey shows no lytic lesions, kidney function is within normal limits.  --08/01/2022 was Cycle 1 Day 1 of Vd  Plan:  --Today is Cycle 3 Day 15 of Vd chemotherapy. Will add Revlimid as her Hgb has improved --Labs show white blood cell count 8.3, hemoglobin 10.9, MCV 84.2, and platelets of 179.  Creatinine is 0.74 and LFTs are within normal limits. --at each visit will collect CBC, CMP, and LDH with monthly restaging labs SPEP and SFLC.  --RTC in 2 weeks with interval weekly treatment.   # Normocytic Anemia #Vitamin B12 Deficiency --Anemia likely driven by multiple myeloma, but may be component of vitamin B12 deficiency as  well. --initial labs showed elevated MMA with B12 180 -- Continue vitamin B12 1000 mcg p.o. daily -- Continue to monitor  #Supportive Care -- chemotherapy education complete -- port placement not required.  --We will discuss starting Zometa/Xgeva at next clinic visit. -- zofran 41m q8H PRN and compazine 13mPO q6H for nausea -- acyclovir 40053mO BID for VCZ prophylaxis -- tylenol 1000 mg q8H PRN for back pain.   Orders Placed This Encounter  Procedures   Multiple Myeloma Panel (SPEP&IFE w/QIG)    Standing Status:   Future    Standing Expiration Date:   10/23/2023   Kappa/lambda light chains    Standing Status:   Future    Standing Expiration Date:   10/23/2023   CBC with Differential ([facility])    Standing Status:   Future    Standing Expiration Date:   10/24/2023   CMP ([facility])    Standing Status:   Future    Standing Expiration Date:   10/24/2023   CBC with Differential ([facility])    Standing Status:   Future    Standing Expiration Date:   10/31/2023   CMP ([facility])    Standing Status:   Future    Standing Expiration Date:   10/31/2023   CBC with Differential ([facility])    Standing Status:   Future    Standing Expiration Date:   11/07/2023   CMP ([facility])    Standing Status:   Future    Standing Expiration Date:   11/07/2023    All questions were answered. The patient knows to call the clinic with any problems, questions or concerns.  A total of more than 30 minutes were spent on this encounter with face-to-face time and non-face-to-face time, including preparing to see the patient, ordering tests and/or medications, counseling the patient and coordination of care as outlined above.   JohLedell PeoplesD Department of Hematology/Oncology [facility]: [phone]: [phone]: [email or web address]  09/25/2022 9:02 AM

## 2022-09-25 NOTE — Patient Instructions (Signed)
  [facility]  Discharge Instructions: Thank you for choosing [facility] to provide your oncology and hematology care.   If you have a lab appointment with the [facility], please go directly to the [facility] and check in at the registration area.   Wear comfortable clothing and clothing appropriate for easy access to any Portacath or PICC line.   We strive to give you quality time with your provider. You may need to reschedule your appointment if you arrive late (15 or more minutes).  Arriving late affects you and other patients whose appointments are after yours.  Also, if you miss three or more appointments without notifying the office, you may be dismissed from the clinic at the provider's discretion.      For prescription refill requests, have your pharmacy contact our office and allow 72 hours for refills to be completed.    Today you received the following chemotherapy and/or immunotherapy agents: Velcade   To help prevent nausea and vomiting after your treatment, we encourage you to take your nausea medication as directed.  BELOW ARE SYMPTOMS THAT SHOULD BE REPORTED IMMEDIATELY: *FEVER GREATER THAN 100.4 F (38 C) OR HIGHER *CHILLS OR SWEATING *NAUSEA AND VOMITING THAT IS NOT CONTROLLED WITH YOUR NAUSEA MEDICATION *UNUSUAL SHORTNESS OF BREATH *UNUSUAL BRUISING OR BLEEDING *URINARY PROBLEMS (pain or burning when urinating, or frequent urination) *BOWEL PROBLEMS (unusual diarrhea, constipation, pain near the anus) TENDERNESS IN MOUTH AND THROAT WITH OR WITHOUT PRESENCE OF ULCERS (sore throat, sores in mouth, or a toothache) UNUSUAL RASH, SWELLING OR PAIN  UNUSUAL VAGINAL DISCHARGE OR ITCHING   Items with * indicate a potential emergency and should be followed up as soon as possible or go to the Emergency Department if any problems should occur.  Please show the CHEMOTHERAPY ALERT CARD or IMMUNOTHERAPY ALERT CARD at check-in to the  Emergency Department and triage nurse.  Should you have questions after your visit or need to cancel or reschedule your appointment, please contact [facility]  Dept: [phone]  and follow the prompts.  Office hours are 8:00 a.m. to 4:30 p.m. Monday - Friday. Please note that voicemails left after 4:00 p.m. may not be returned until the following business day.  We are closed weekends and major holidays. You have access to a nurse at all times for urgent questions. Please call the main number to the clinic Dept: [phone] and follow the prompts.   For any non-urgent questions, you may also contact your provider using MyChart. We now offer e-Visits for anyone 56 and older to request care online for non-urgent symptoms. For details visit [email or web address].   Also download the MyChart app! Go to the app store, search "MyChart", open the app, select [facility], and log in with your MyChart username and password.  Masks are optional in the cancer centers. If you would like for your care team to wear a mask while they are taking care of you, please let them know. You may have one support person who is at least 86 years old accompany you for your appointments.

## 2022-09-26 ENCOUNTER — Other Ambulatory Visit: Payer: Self-pay

## 2022-09-26 LAB — KAPPA/LAMBDA LIGHT CHAINS
Kappa free light chain: 8.7 mg/L (ref 3.3–19.4)
Kappa, lambda light chain ratio: 1.43 (ref 0.26–1.65)
Lambda free light chains: 6.1 mg/L (ref 5.7–26.3)

## 2022-09-27 DIAGNOSIS — M546 Pain in thoracic spine: Secondary | ICD-10-CM | POA: Diagnosis not present

## 2022-09-27 DIAGNOSIS — R2689 Other abnormalities of gait and mobility: Secondary | ICD-10-CM | POA: Diagnosis not present

## 2022-09-27 DIAGNOSIS — M6281 Muscle weakness (generalized): Secondary | ICD-10-CM | POA: Diagnosis not present

## 2022-09-27 DIAGNOSIS — M545 Low back pain, unspecified: Secondary | ICD-10-CM | POA: Diagnosis not present

## 2022-09-28 ENCOUNTER — Ambulatory Visit: Payer: Medicare Other | Admitting: Cardiology

## 2022-09-28 ENCOUNTER — Encounter: Payer: Self-pay | Admitting: Cardiology

## 2022-09-28 VITALS — BP 135/46 | HR 71 | Resp 16 | Ht 63.0 in | Wt 159.2 lb

## 2022-09-28 DIAGNOSIS — I10 Essential (primary) hypertension: Secondary | ICD-10-CM | POA: Diagnosis not present

## 2022-09-28 DIAGNOSIS — E7800 Pure hypercholesterolemia, unspecified: Secondary | ICD-10-CM

## 2022-09-28 DIAGNOSIS — I6523 Occlusion and stenosis of bilateral carotid arteries: Secondary | ICD-10-CM | POA: Diagnosis not present

## 2022-09-28 DIAGNOSIS — I5032 Chronic diastolic (congestive) heart failure: Secondary | ICD-10-CM | POA: Diagnosis not present

## 2022-09-28 DIAGNOSIS — Z952 Presence of prosthetic heart valve: Secondary | ICD-10-CM

## 2022-09-28 NOTE — Progress Notes (Signed)
Primary Physician:  [facility]   Patient ID: Zoe [name], female    DOB: Feb 13, 1934, 88 y.o.   MRN: [id number]  Subjective:    Chief Complaint  Patient presents with   Congestive Heart Failure   Carotid stenosis   Follow-up    3 months    HPI: Zoe [name]  is a 88 y.o. Caucasian female with history of possibly mild coronary spasm by coronary angiogram in 2014,  severe AS s/p TAVR by Dr. Roxy Manns on 09/03/2018, asymptomatic bilateral carotid artery stenosis,  essential hypertension, hyperlipidemia, hyperglycemia and GERD. She underwent T8 kyphoplasty on 09/07/2022 without any postprocedural cardiac complications.  Patient also going through chemotherapy, presently diagnosed with multiple myeloma.  Patient presents here for 29-monthoffice visit, states that she has not had any further dyspnea or leg edema.  She has continued to take furosemide on a daily basis due to fear of precipitating acute decompensated heart failure.  She is having some tingling and numbness in the feet and also since back surgery, has mild back pain but overall since surgery back pain has improved significantly.  She is accompanied by daughter-in-law.   Past Medical History:  Diagnosis Date   Arthritis    some - per patient   [place]    breast cancer / left    Cataract    bilat    GERD (gastroesophageal reflux disease)    History of kidney stones    Hyperlipidemia    Hypertension    Hypothyroidism    Macular degeneration    Left   S/P TAVR (transcatheter aortic valve replacement) 09/03/2018   23 mm Edwards Sapien 3 transcatheter heart valve placed via percutaneous right transfemoral approach    Severe aortic stenosis    Stress incontinence    Thyroid disease    Tinnitus    Social History   Tobacco Use   Smoking status: Never   Smokeless tobacco: Never  Substance Use Topics   Alcohol use: No     Review of Systems  Cardiovascular:  Negative for chest  pain, dyspnea on exertion and leg swelling.   Objective:  Blood pressure (!) 135/46, pulse 71, resp. rate 16, height _0  (1.6 m), weight 159 lb 3.2 oz (72.2 kg), SpO2 96 %. Body mass index is 28.2 kg/m.     09/28/2022   10:35 AM 09/25/2022    8:34 AM 09/19/2022    3:19 PM  Vitals with BMI  Height _1     Weight 159 lbs 3 oz 159 lbs 2 oz   BMI 246.27203.50  Systolic [id number] Diastolic 46 52 80  Pulse 71 67       Physical Exam Vitals reviewed.  Constitutional:      Appearance: She is obese.  Neck:     Vascular: No JVD.  Cardiovascular:     Rate and Rhythm: Normal rate and regular rhythm.     Pulses: Normal pulses and intact distal pulses.     Heart sounds: Normal heart sounds. No murmur heard. Pulmonary:     Effort: Pulmonary effort is normal. No accessory muscle usage or respiratory distress.     Breath sounds: Normal breath sounds.  Abdominal:     General: Bowel sounds are normal.     Palpations: Abdomen is soft.  Musculoskeletal:     Right lower leg: No edema.     Left lower leg: No edema.    Radiology: No results found.  Laboratory examination:   Lab Results  Component Value Date   NA 131 (L) 09/25/2022   K 3.7 09/25/2022   CO2 28 09/25/2022   GLUCOSE 134 (H) 09/25/2022   BUN 18 09/25/2022   CREATININE 0.74 09/25/2022   CALCIUM 8.6 (L) 09/25/2022   EGFR 59 (L) 06/19/2022   GFRNONAA >60 09/25/2022       Latest Ref Rng & Units 09/25/2022    8:10 AM 09/19/2022    1:33 PM 09/11/2022    9:01 AM  CMP  Glucose 70 - 99 mg/dL 134  178  118   BUN 8 - 23 mg/dL _0 Creatinine 0.44 - 1.00 mg/dL 0.74  0.76  0.58   Sodium 135 - 145 mmol/L 131  129  133   Potassium 3.5 - 5.1 mmol/L 3.7  4.0  3.9   Chloride 98 - 111 mmol/L 96  94  98   CO2 22 - 32 mmol/L _1 Calcium 8.9 - 10.3 mg/dL 8.6  9.0  8.6   Total Protein 6.5 - 8.1 g/dL 7.5  9.2  8.1   Total Bilirubin 0.3 - 1.2 mg/dL 0.6  0.6  0.5   Alkaline Phos 38 - 126 U/L 67  88  81   AST 15  - 41 U/L _2 ALT 0 - 44 U/L _3 Latest Ref Rng & Units 09/25/2022    8:10 AM 09/19/2022    1:33 PM 09/11/2022    8:47 AM  CBC  WBC 4.0 - 10.5 K/uL 8.3  10.6  6.9   Hemoglobin 12.0 - 15.0 g/dL 10.9  11.7  10.6   Hematocrit 36.0 - 46.0 % 32.5  35.5  31.4   Platelets 150 - 400 K/uL 179  267  172    Lipid Panel     Component Value Date/Time   CHOL 103 07/16/2019 0941   TRIG 75 07/16/2019 0941   HDL 48 07/16/2019 0941   LDLCALC 39 07/16/2019 0941   LDLDIRECT 48 07/16/2019 0941   HEMOGLOBIN A1C Lab Results  Component Value Date   HGBA1C 5.8 (H) 07/16/2019   MPG 111.15 08/30/2018   TSH Recent Labs    04/02/22 1830  TSH 0.891   External labs  Labs 07/10/2022:  TSH 0.503, normal.  Serum osmolality 287, urinary osmolality 90  Total cholesterol 113, triglycerides 100, HDL 43, LDL 50.   Current Outpatient Medications:    Artificial Tear Solution (SOOTHE XP) SOLN, Place 1 drop into both eyes every evening., Disp: , Rfl:    aspirin EC 81 MG tablet, Take 81 mg by mouth daily. Swallow whole., Disp: , Rfl:    carvedilol (COREG) 12.5 MG tablet, Take 12.5 mg by mouth 2 (two) times daily with a meal., Disp: , Rfl:    Cholecalciferol (VITAMIN D3) 50 MCG (2000 UT) capsule, Take 1 capsule (2,000 Units total) by mouth daily., Disp: , Rfl:    cyanocobalamin (VITAMIN B12) 1000 MCG tablet, Take 1,000 mcg by mouth daily., Disp: , Rfl:    dapagliflozin propanediol (FARXIGA) 10 MG TABS tablet, Take 1 tablet (10 mg total) by mouth daily., Disp: 30 tablet, Rfl: 2   dexamethasone (DECADRON) 4 MG tablet, Take 20 mg by mouth daily. Take 20 mg (5 tablets) on day of multiple myeloma treatment, Disp: , Rfl:    esomeprazole (NEXIUM) 20 MG capsule, Take 20  mg by mouth daily as needed (Heartburn)., Disp: , Rfl:    ferrous sulfate 325 (65 FE) MG tablet, Take 325 mg by mouth daily with breakfast., Disp: , Rfl:    furosemide (LASIX) 20 MG tablet, PLEASE SEE ATTACHED FOR DETAILED  DIRECTIONS (Patient taking differently: Take 20 mg by mouth daily.), Disp: 90 tablet, Rfl: 1   HYDROcodone-acetaminophen (NORCO/VICODIN) 5-325 MG tablet, Take 1 tablet by mouth 3 (three) times daily., Disp: , Rfl:    lenalidomide (REVLIMID) 25 MG capsule, Take 1 capsule (25 mg total) by mouth daily. Take for 14 days, then none for 7 days. Repeat every 21 days. [employer] Auth # [id number]; Date Obtained 09/11/22, Disp: 14 capsule, Rfl: 0   levothyroxine (SYNTHROID, LEVOTHROID) 100 MCG tablet, Take 100 mcg by mouth daily before breakfast., Disp: , Rfl: 2   losartan (COZAAR) 25 MG tablet, Take 1 tablet (25 mg total) by mouth daily., Disp: 90 tablet, Rfl: 1   meloxicam (MOBIC) 7.5 MG tablet, Take 7.5 mg by mouth daily. Take for 30 days, Disp: , Rfl:    ondansetron (ZOFRAN) 8 MG tablet, Take 1 tablet (8 mg total) by mouth every 8 (eight) hours as needed., Disp: 30 tablet, Rfl: 0   potassium chloride (KLOR-CON M) 10 MEQ tablet, Take 2 tablets (20 mEq total) by mouth daily as needed. Take with furosemide (Patient taking differently: Take 10 mEq by mouth. Take with furosemide), Disp: 180 tablet, Rfl: 0   pravastatin (PRAVACHOL) 40 MG tablet, Take 40 mg by mouth every evening., Disp: , Rfl:    prochlorperazine (COMPAZINE) 10 MG tablet, Take 1 tablet (10 mg total) by mouth every 6 (six) hours as needed for nausea or vomiting., Disp: 30 tablet, Rfl: 0   calcitonin, salmon, (MIACALCIN/FORTICAL) 200 UNIT/ACT nasal spray, Place 1 spray into alternate nostrils daily. (Patient not taking: Reported on 09/28/2022), Disp: , Rfl:      Cardiac Studies:    Coronary angiogram 08/06/2018: normal coronary arteries. Moderate pulmonary hypertension due to AS with elevated PW. Preserved CO and CI.  TAVR 09/03/2018: Edwards Sapien 3 THV (size 23 mm, model # 9600TFX, serial # [id number]) by Lilly Cove and Darlina Guys.  CXR PA/LAT 11/07/2018:  Cardiac shadow is enlarged but stable. Changes of prior TAVR ar again noted. Aortic  calcifications are seen. Previously noted left upper lobe pneumonia has resolved in the interval. No focal infiltrate or sizable effusion is seen. Changes of prior vertebral augmentation are noted.  Carotid artery duplex 01/05/2021: Minimal stenosis in the right internal carotid artery (1-15%). Stenosis in the right external carotid artery (<50%). Duplex suggests stenosis in the left internal carotid artery (50-69%). Stenosis in the left external carotid artery (<50%). Antegrade right vertebral artery flow. Antegrade left vertebral artery flow. Follow up in six months is appropriate if clinically indicated.  No significant change since 06/23/2020.  ECHOCARDIOGRAM COMPLETE 04/03/2022:  1. Left ventricular ejection fraction, by estimation, is 60 to 65%. The left ventricle has normal function. The left ventricle has no regional wall motion abnormalities. There is moderate left ventricular hypertrophy. Left ventricular diastolic  parameters are consistent with Grade II diastolic dysfunction (pseudonormalization).  2. Right ventricular systolic function is normal. The right ventricular size is normal. Tricuspid regurgitation signal is inadequate for assessing PA pressure.  3. Left atrial size was moderately dilated.  4. The mitral valve is degenerative. Trivial mitral valve regurgitation. Moderate mitral annular calcification.  5. Aortic valve regurgitation is not visualized. Procedure Date: 09/03/2018.  6. Aortic no signficant ascending  aortic aneurysm.  7. The inferior vena cava is normal in size with greater than 50% respiratory variability, suggesting right atrial pressure of 3 mmHg.   Comparison(s): No significant change from prior study.   Carotid artery duplex 09/21/2022:  Duplex suggests stenosis in the right internal carotid artery (1-15%).  Duplex suggests stenosis in the left internal carotid artery (16-49%).  Antegrade right vertebral artery flow. Antegrade left vertebral artery flow.   Compared to the study done on 02/15/2022, left ICA stenosis of >50% has now regressed.  Otherwise no significant change.  There is mild to moderate heterogenous plaque noted in the bilateral carotid arteries. Follow up in one year is appropriate if clinically indicated.   EKG  EKG 06/22/2022: Normal sinus rhythm at a rate of 72 bpm, normal axis, no evidence of ischemia, normal EKG.  No change from 01/24/2022.     Assessment:     ICD-10-CM   1. Chronic heart failure with preserved ejection fraction (HFpEF) (HCC)  I50.32     2. TAVR 09/03/2018: Edwards Sapien 3 THV (size 23 mm, model # 9600TFX, serial # [id number])  Z95.2     3. Primary hypertension  I10     4. Asymptomatic bilateral carotid artery stenosis  I65.23     5. Hypercholesteremia  E78.00      No orders of the defined types were placed in this encounter.   Medications Discontinued During This Encounter  Medication Reason   acyclovir (ZOVIRAX) 400 MG tablet Completed Course    Recommendations:   Zoe [name] is a fairly active 88 y.o. Caucasian female with history of possibly mild coronary spasm by coronary angiogram in 2014,  severe AS s/p TAVR by Dr. Roxy Manns on 09/03/2018, asymptomatic bilateral carotid artery stenosis,  essential hypertension, hyperlipidemia, hyperglycemia and GERD. She underwent T8 kyphoplasty on 09/07/2022 without any postprocedural cardiac complications.  Patient also going through chemotherapy, presently diagnosed with multiple myeloma.  Patient was admitted 04/03/2022 - 04/05/2022 with acute on chronic diastolic heart failure and hyponatremia (likely dilutional), as well as AKI.    1. Chronic heart failure with preserved ejection fraction (HFpEF) ([place]) Patient is presently well compensated.  Advised her to take furosemide only on a as needed basis.  She is taking it every day, hyponatremia is related to excess diuresis with furosemide.  Do not think she has SIADH.  External labs reviewed.  2. TAVR  09/03/2018: Edwards Sapien 3 THV (size 23 mm, model # 9600TFX, serial # [id number]) I do not hear systolic murmur, heart sounds are normal, echocardiogram has remained stable TAVR 6 months ago.  She needs endocarditis prophylaxis.  3. Primary hypertension Blood pressure is well-controlled.  No changes were done today.  4. Asymptomatic bilateral carotid artery stenosis Left carotid artery stenosis has been present, she will need annual surveillance only.  This will be ordered on her next office visit in 6 months.  5. Hypercholesteremia External labs reviewed, lipids under excellent control.   Adrian Prows, MD, [facility] 09/28/2022, 11:13 AM Office: [phone] Fax: [phone] Pager: [phone]

## 2022-09-29 ENCOUNTER — Other Ambulatory Visit: Payer: Self-pay | Admitting: *Deleted

## 2022-09-29 ENCOUNTER — Other Ambulatory Visit: Payer: Self-pay

## 2022-09-29 DIAGNOSIS — C9000 Multiple myeloma not having achieved remission: Secondary | ICD-10-CM

## 2022-09-29 MED ORDER — LENALIDOMIDE 25 MG PO CAPS: 25.0000 mg | ORAL_CAPSULE | Freq: Every day | ORAL | 0 refills | Status: DC

## 2022-09-30 ENCOUNTER — Emergency Department (HOSPITAL_BASED_OUTPATIENT_CLINIC_OR_DEPARTMENT_OTHER): Payer: Medicare Other

## 2022-09-30 ENCOUNTER — Encounter (HOSPITAL_COMMUNITY): Payer: Self-pay

## 2022-09-30 ENCOUNTER — Emergency Department (HOSPITAL_COMMUNITY)
Admission: EM | Admit: 2022-09-30 | Discharge: 2022-09-30 | Disposition: A | Discharge status: Home / Self Care | Payer: Medicare Other | Attending: Emergency Medicine | Admitting: Emergency Medicine

## 2022-09-30 ENCOUNTER — Emergency Department (HOSPITAL_COMMUNITY): Payer: Medicare Other

## 2022-09-30 DIAGNOSIS — M79661 Pain in right lower leg: Secondary | ICD-10-CM | POA: Diagnosis not present

## 2022-09-30 DIAGNOSIS — Z8579 Personal history of other malignant neoplasms of lymphoid, hematopoietic and related tissues: Secondary | ICD-10-CM | POA: Insufficient documentation

## 2022-09-30 DIAGNOSIS — R52 Pain, unspecified: Secondary | ICD-10-CM | POA: Diagnosis not present

## 2022-09-30 DIAGNOSIS — Z7982 Long term (current) use of aspirin: Secondary | ICD-10-CM | POA: Diagnosis not present

## 2022-09-30 DIAGNOSIS — M79662 Pain in left lower leg: Secondary | ICD-10-CM | POA: Insufficient documentation

## 2022-09-30 DIAGNOSIS — M79604 Pain in right leg: Secondary | ICD-10-CM

## 2022-09-30 DIAGNOSIS — M79605 Pain in left leg: Secondary | ICD-10-CM | POA: Diagnosis not present

## 2022-09-30 DIAGNOSIS — M1611 Unilateral primary osteoarthritis, right hip: Secondary | ICD-10-CM | POA: Diagnosis not present

## 2022-09-30 DIAGNOSIS — M1612 Unilateral primary osteoarthritis, left hip: Secondary | ICD-10-CM | POA: Diagnosis not present

## 2022-09-30 DIAGNOSIS — M7989 Other specified soft tissue disorders: Secondary | ICD-10-CM | POA: Diagnosis not present

## 2022-09-30 DIAGNOSIS — C9000 Multiple myeloma not having achieved remission: Secondary | ICD-10-CM | POA: Diagnosis not present

## 2022-09-30 LAB — COMPREHENSIVE METABOLIC PANEL
ALT: 13 U/L (ref 0–44)
AST: 19 U/L (ref 15–41)
Albumin: 2.8 g/dL — ABNORMAL LOW (ref 3.5–5.0)
Alkaline Phosphatase: 62 U/L (ref 38–126)
Anion gap: 9 (ref 5–15)
BUN: 20 mg/dL (ref 8–23)
CO2: 24 mmol/L (ref 22–32)
Calcium: 8.3 mg/dL — ABNORMAL LOW (ref 8.9–10.3)
Chloride: 98 mmol/L (ref 98–111)
Creatinine, Ser: 0.64 mg/dL (ref 0.44–1.00)
GFR, Estimated: >60 mL/min (ref >60)
Glucose, Bld: 96 mg/dL (ref 70–99)
Potassium: 4 mmol/L (ref 3.5–5.1)
Sodium: 131 mmol/L — ABNORMAL LOW (ref 135–145)
Total Bilirubin: 1 mg/dL (ref 0.3–1.2)
Total Protein: 6.8 g/dL (ref 6.5–8.1)

## 2022-09-30 LAB — CBC WITH DIFFERENTIAL/PLATELET
Abs Immature Granulocytes: 0.05 10*3/uL (ref 0.00–0.07)
Basophils Absolute: 0 10*3/uL (ref 0.0–0.1)
Basophils Relative: 0 %
Eosinophils Absolute: 0.1 10*3/uL (ref 0.0–0.5)
Eosinophils Relative: 2 %
HCT: 38.2 % (ref 36.0–46.0)
Hemoglobin: 11.4 g/dL — ABNORMAL LOW (ref 12.0–15.0)
Immature Granulocytes: 1 %
Lymphocytes Relative: 10 %
Lymphs Abs: 0.9 10*3/uL (ref 0.7–4.0)
MCH: 27.5 pg (ref 26.0–34.0)
MCHC: 29.8 g/dL — ABNORMAL LOW (ref 30.0–36.0)
MCV: 92 fL (ref 80.0–100.0)
Monocytes Absolute: 1.3 10*3/uL — ABNORMAL HIGH (ref 0.1–1.0)
Monocytes Relative: 14 %
Neutro Abs: 6.9 10*3/uL (ref 1.7–7.7)
Neutrophils Relative %: 73 %
Platelets: 127 10*3/uL — ABNORMAL LOW (ref 150–400)
RBC: 4.15 MIL/uL (ref 3.87–5.11)
RDW: 19.9 % — ABNORMAL HIGH (ref 11.5–15.5)
WBC: 9.3 10*3/uL (ref 4.0–10.5)
nRBC: 0.4 % — ABNORMAL HIGH (ref 0.0–0.2)

## 2022-09-30 LAB — CK: Total CK: 20 U/L — ABNORMAL LOW (ref 38–234)

## 2022-09-30 LAB — LACTIC ACID, PLASMA: Lactic Acid, Venous: 1 mmol/L (ref 0.5–1.9)

## 2022-09-30 MED ORDER — HYDROCODONE-ACETAMINOPHEN 5-325 MG PO TABS
1.0000 | ORAL_TABLET | Freq: Once | ORAL | Status: AC
  Administered 2022-09-30: 1 via ORAL
  Filled 2022-09-30: qty 1

## 2022-09-30 NOTE — ED Provider Notes (Signed)
[facility] DEPT Provider Note   CSN: [id number] Arrival date & time: 09/30/22  0748     History  Chief Complaint  Patient presents with   Leg Pain    Zoe [name] is a 88 y.o. female.  HPI     88 year old female comes in with chief complaint of bilateral leg pain. Patient has history of multiple myeloma.  She is on a oral chemotherapy regimen, day 11 of 14.  She states that over the last 2 days she has been having pain in her legs bilaterally.  She recently had kyphoplasty in November.  Patient states that the pain is constant and there is no specific aggravating or relieving factors.  She read the side effects of the medications, and it was advised that muscle cramps could be a side effect.  Additionally she called the on-call nurse, she was advised to come to the ER because of blood clot possibility.  Home Medications Prior to Admission medications   Medication Sig Start Date End Date Taking? Authorizing Provider  Artificial Tear Solution (SOOTHE XP) SOLN Place 1 drop into both eyes every evening.   Yes [provider]  aspirin EC 81 MG tablet Take 81 mg by mouth at bedtime. Swallow whole.   Yes [provider]  carvedilol (COREG) 12.5 MG tablet Take 12.5 mg by mouth 2 (two) times daily with a meal.   Yes [provider]  Cholecalciferol (VITAMIN D3) 50 MCG (2000 UT) capsule Take 1 capsule (2,000 Units total) by mouth daily. 09/11/22  Yes Orson Slick, MD  cyanocobalamin (VITAMIN B12) 1000 MCG tablet Take 1,000 mcg by mouth daily.   Yes [provider]  dapagliflozin propanediol (FARXIGA) 10 MG TABS tablet Take 1 tablet (10 mg total) by mouth daily. 07/12/22  Yes Adrian Prows, MD  dexamethasone (DECADRON) 4 MG tablet Take 20 mg by mouth daily. Take 20 mg (5 tablets) on day of multiple myeloma treatment   Yes [provider]  esomeprazole (NEXIUM) 20 MG capsule Take 20 mg by mouth daily as needed  (Heartburn). 12/06/21  Yes [provider]  ferrous sulfate 325 (65 FE) MG tablet Take 325 mg by mouth daily with breakfast.   Yes [provider]  HYDROcodone-acetaminophen (NORCO/VICODIN) 5-325 MG tablet Take 1 tablet by mouth 3 (three) times daily. 08/25/22  Yes [provider]  lenalidomide (REVLIMID) 25 MG capsule Take 1 capsule (25 mg total) by mouth daily. Take for 14 days, then none for 7 days. Repeat every 21 days. [employer] Auth # [id number] Date Obtained 09/29/22 09/29/22  Yes Orson Slick, MD  levothyroxine (SYNTHROID, LEVOTHROID) 100 MCG tablet Take 100 mcg by mouth daily before breakfast. 12/29/15  Yes [provider]  losartan (COZAAR) 25 MG tablet Take 1 tablet (25 mg total) by mouth daily. 06/12/22  Yes Adrian Prows, MD  ondansetron (ZOFRAN) 8 MG tablet Take 1 tablet (8 mg total) by mouth every 8 (eight) hours as needed. Patient taking differently: Take 8 mg by mouth every 8 (eight) hours as needed for nausea or vomiting. 07/24/22  Yes Orson Slick, MD  polyethylene glycol (MIRALAX / GLYCOLAX) 17 g packet Take 17 g by mouth daily as needed for mild constipation.   Yes [provider]  pravastatin (PRAVACHOL) 40 MG tablet Take 40 mg by mouth every evening.   Yes [provider]  prochlorperazine (COMPAZINE) 10 MG tablet Take 1 tablet (10 mg total) by mouth every  6 (six) hours as needed for nausea or vomiting. 07/24/22  Yes Orson Slick, MD  calcitonin, salmon, (MIACALCIN/FORTICAL) 200 UNIT/ACT nasal spray Place 1 spray into alternate nostrils daily. Patient not taking: Reported on 09/28/2022    [provider]  furosemide (LASIX) 20 MG tablet PLEASE SEE ATTACHED FOR DETAILED DIRECTIONS Patient taking differently: Take 20 mg by mouth daily. 06/29/22   Adrian Prows, MD  potassium chloride (KLOR-CON M) 10 MEQ tablet Take 2 tablets (20 mEq total) by mouth daily as needed. Take with furosemide Patient taking differently: Take 10 mEq  by mouth. Take with furosemide 06/22/22 09/28/22  Adrian Prows, MD      Allergies    Penicillins and Sulfa antibiotics    Review of Systems   Review of Systems  Physical Exam Updated Vital Signs BP (!) 113/99   Pulse 65   Temp 97.6 F (36.4 C)   Resp 18   SpO2 99%  Physical Exam Vitals and nursing note reviewed.  Constitutional:      Appearance: She is well-developed.  HENT:     Head: Atraumatic.  Cardiovascular:     Rate and Rhythm: Normal rate.  Pulmonary:     Effort: Pulmonary effort is normal.  Musculoskeletal:        General: Swelling and tenderness present.     Cervical back: Normal range of motion and neck supple.     Comments: Bilateral lower extremity swelling, with bilateral calf tenderness, trace pitting.  Skin:    General: Skin is warm and dry.  Neurological:     Mental Status: She is alert and oriented to person, place, and time.     ED Results / Procedures / Treatments   Labs (all labs ordered are listed, but only abnormal results are displayed) Labs Reviewed  COMPREHENSIVE METABOLIC PANEL - Abnormal; Notable for the following components:      Result Value   Sodium 131 (*)    Calcium 8.3 (*)    Albumin 2.8 (*)    All other components within normal limits  CBC WITH DIFFERENTIAL/PLATELET - Abnormal; Notable for the following components:   Hemoglobin 11.4 (*)    MCHC 29.8 (*)    RDW 19.9 (*)    Platelets 127 (*)    nRBC 0.4 (*)    Monocytes Absolute 1.3 (*)    All other components within normal limits  CK - Abnormal; Notable for the following components:   Total CK 20 (*)    All other components within normal limits  LACTIC ACID, PLASMA  LACTIC ACID, PLASMA    EKG None  Radiology VAS [place] LOWER EXTREMITY VENOUS (DVT)  Result Date: 09/30/2022  Lower Venous DVT Study Patient Name:  Zoe [name]  Date of Exam:   09/30/2022 Medical Rec #: [id number]            Accession #:    [id number] Date of Birth: Dec 07, 1933             Patient Gender: F  Patient Age:   36 years Exam Location:  [facility] Procedure:      VAS [place] LOWER EXTREMITY VENOUS (DVT) Referring Phys: Thelma Comp Berneita Sanagustin --------------------------------------------------------------------------------  Indications: Pain and swelling bilaterally.  Risk Factors: Cancer -multiple myeloma, on chemo History of CHF. Comparison Study: No prior studies. Performing Technologist: Darlin Coco RDMS, RVT  Examination Guidelines: A complete evaluation includes B-mode imaging, spectral Doppler, color Doppler, and power Doppler as needed of all accessible portions of each vessel. Bilateral  testing is considered an integral part of a complete examination. Limited examinations for reoccurring indications may be performed as noted. The reflux portion of the exam is performed with the patient in reverse Trendelenburg.  +---------+---------------+---------+-----------+----------+--------------+ RIGHT    CompressibilityPhasicitySpontaneityPropertiesThrombus Aging +---------+---------------+---------+-----------+----------+--------------+ CFV      Full           Yes      Yes                                 +---------+---------------+---------+-----------+----------+--------------+ SFJ      Full                                                        +---------+---------------+---------+-----------+----------+--------------+ FV Prox  Full                                                        +---------+---------------+---------+-----------+----------+--------------+ FV Mid   Full                                                        +---------+---------------+---------+-----------+----------+--------------+ FV DistalFull                                                        +---------+---------------+---------+-----------+----------+--------------+ PFV      Full                                                         +---------+---------------+---------+-----------+----------+--------------+ POP      Full           Yes      Yes                                 +---------+---------------+---------+-----------+----------+--------------+ PTV      Full                                                        +---------+---------------+---------+-----------+----------+--------------+ PERO     Full                                                        +---------+---------------+---------+-----------+----------+--------------+   +---------+---------------+---------+-----------+----------+--------------+ LEFT     CompressibilityPhasicitySpontaneityPropertiesThrombus Aging +---------+---------------+---------+-----------+----------+--------------+ CFV  Partial        Yes      Yes                                 +---------+---------------+---------+-----------+----------+--------------+ SFJ      Full                                                        +---------+---------------+---------+-----------+----------+--------------+ FV Prox  Full                                                        +---------+---------------+---------+-----------+----------+--------------+ FV Mid   Full                                                        +---------+---------------+---------+-----------+----------+--------------+ FV DistalFull                                                        +---------+---------------+---------+-----------+----------+--------------+ PFV      Full                                                        +---------+---------------+---------+-----------+----------+--------------+ POP      Full           Yes      Yes                                 +---------+---------------+---------+-----------+----------+--------------+ PTV      Full                                                         +---------+---------------+---------+-----------+----------+--------------+ PERO     Full                                                        +---------+---------------+---------+-----------+----------+--------------+     Summary: RIGHT: - There is no evidence of deep vein thrombosis in the lower extremity.  - No cystic structure found in the popliteal fossa.  LEFT: - There is no evidence of deep vein thrombosis in the lower extremity.  - No cystic structure found in the popliteal fossa.  *See table(s) above for measurements and observations.  Preliminary    DG Pelvis 1-2 Views  Result Date: 09/30/2022 CLINICAL DATA:  Multiple myeloma, pain EXAM: PELVIS - 1 VIEW; RIGHT FEMUR 4 VIEWS; LEFT FEMUR 4 VIEWS COMPARISON:  None Available. FINDINGS: There is no evidence of pelvic fracture or diastasis. No pelvic bone lesions are seen. Lumbosacral spondylitic changes noted. There is bilateral SI joint degenerative changes. Bilateral hip osteoarthritis with osteophytes and joint space narrowing. Images of the bilateral femora demonstrate no traumatic abnormalities. Patient is status post bilateral total knee arthroplasty. IMPRESSION: 1. 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 2. No acute osseous abnormalities identified. 3. Status post bilateral total knee arthroplasties. 4. No osteolytic lesions identified to correspond to patient's history of myeloma. Electronically Signed   By: Sammie Bench M.D.   On: 09/30/2022 09:07   DG Femur Min 2 Views Left  Result Date: 09/30/2022 CLINICAL DATA:  Multiple myeloma, pain EXAM: PELVIS - 1 VIEW; RIGHT FEMUR 4 VIEWS; LEFT FEMUR 4 VIEWS COMPARISON:  None Available. FINDINGS: There is no evidence of pelvic fracture or diastasis. No pelvic bone lesions are seen. Lumbosacral spondylitic changes noted. There is bilateral SI joint degenerative changes. Bilateral hip osteoarthritis with  osteophytes and joint space narrowing. Images of the bilateral femora demonstrate no traumatic abnormalities. Patient is status post bilateral total knee arthroplasty. IMPRESSION: 1. 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 2. No acute osseous abnormalities identified. 3. Status post bilateral total knee arthroplasties. 4. No osteolytic lesions identified to correspond to patient's history of myeloma. Electronically Signed   By: Sammie Bench M.D.   On: 09/30/2022 09:07   DG Femur Min 2 Views Right  Result Date: 09/30/2022 CLINICAL DATA:  Multiple myeloma, pain EXAM: PELVIS - 1 VIEW; RIGHT FEMUR 4 VIEWS; LEFT FEMUR 4 VIEWS COMPARISON:  None Available. FINDINGS: There is no evidence of pelvic fracture or diastasis. No pelvic bone lesions are seen. Lumbosacral spondylitic changes noted. There is bilateral SI joint degenerative changes. Bilateral hip osteoarthritis with osteophytes and joint space narrowing. Images of the bilateral femora demonstrate no traumatic abnormalities. Patient is status post bilateral total knee arthroplasty. IMPRESSION: 1. 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 2. No acute osseous abnormalities identified. 3. Status post bilateral total knee arthroplasties. 4. No osteolytic lesions identified to correspond to patient's history of myeloma. Electronically Signed   By: Sammie Bench M.D.   On: 09/30/2022 09:07    Procedures Procedures    Medications Ordered in ED Medications  HYDROcodone-acetaminophen (NORCO/VICODIN) 5-325 MG per tablet 1 tablet (1 tablet Oral Given 09/30/22 1449)    ED Course/ Medical Decision Making/ A&P Clinical Course as of 09/30/22 1704  Sat Sep 30, 2022  1703 Ultrasound DVT interpreted independently.  There is no evidence of blood clot.  Labs including CK are reassuring.   Sodium is slightly low.  Patient's platelet is slightly low.  I consulted oncologist with the question of medication side effects.  They recommend that patient pause taking her oral chemotherapy and call Dr. Libby Maw clinic on Monday with the specific question on whether to resume the medication or not.  They might even need to modify the dose.  This recommendation has been discussed with the patient, she is comfortable with the plan. [AN]    Clinical Course User Index [AN] Varney Biles, MD  Medical Decision Making 88 year old female with history of multiple myeloma on oral chemo, aortic valve replacement, thoracic spine kyphoplasty about 3 weeks ago comes in with chief complaint of leg swelling and pain.  Differential diagnosis considered includes worsening peripheral edema secondary to CHF, patient's Lasix was recently discontinued.  Other possibilities includes chemotherapy side effects, DVT.  Patient was advised to come to the ER to ensure she did not have DVT.  Plan is to get ultrasound and basic labs along with CK.  Problems Addressed: Pain in both lower extremities: undiagnosed new problem with uncertain prognosis  Risk Prescription drug management.    Final Clinical Impression(s) / ED Diagnoses Final diagnoses:  Pain in both lower extremities    Rx / DC Orders ED Discharge Orders     None         Varney Biles, MD 09/30/22 1704

## 2022-09-30 NOTE — ED Triage Notes (Signed)
Pt presents with c/o bilateral pain in both thighs that started several days ago, consistent in nature. Pt reports that she is on day 11 of a chemo pill for multiple myeloma.

## 2022-09-30 NOTE — ED Provider Triage Note (Signed)
Emergency Medicine Provider Triage Evaluation Note  [name] , a 88 y.o. female  was evaluated in triage.  Pt complains of bilateral leg pains that have been ongoing for the last couple of days.  She is currently on a chemotherapy pill, states that she took this today and she began to feel more tingling sensation to her lower extremities, no prior history of neuropathy.  Patient is undergoing treatment by oncologist Dr. Lorenso Courier.  There is no falls or fevers. In addition, recent hypoplasty 3 weeks ago.   Review of Systems  Positive: Leg pain Negative: Fever, back pain, trauma  Physical Exam  BP (!) 159/81 (BP Location: Right Arm)   Pulse 72   Temp 97.7 F (36.5 C) (Oral)   Resp 18   SpO2 99%  Gen:   Awake, no distress   Resp:  Normal effort  MSK:   Moves extremities without difficulty  Other:    Medical Decision Making  Medically screening exam initiated at 9:13 AM.  Appropriate orders placed.  [place] B Massimo was informed that the remainder of the evaluation will be completed by another provider, this initial triage assessment does not replace that evaluation, and the importance of remaining in the ED until their evaluation is complete.     Janeece Fitting, PA-C 09/30/22 [phone]

## 2022-09-30 NOTE — Discharge Instructions (Signed)
The workup in the emergency room including ultrasound of your legs revealed no evidence of any abnormality.  I discussed the case with oncologist on-call.  They recommend that you do not take your oral chemo until you speak with Dr. Libby Maw team tomorrow morning.  Please call Dr. Lorenso Courier to discuss your pain and ask him specifically if your oral chemotherapy regimen needs to be discontinued or modified.

## 2022-10-02 ENCOUNTER — Inpatient Hospital Stay: Payer: Medicare Other

## 2022-10-02 ENCOUNTER — Other Ambulatory Visit: Payer: Self-pay

## 2022-10-02 ENCOUNTER — Encounter: Payer: Medicare Other | Admitting: Physician Assistant

## 2022-10-02 ENCOUNTER — Other Ambulatory Visit: Payer: Self-pay | Admitting: Physician Assistant

## 2022-10-02 ENCOUNTER — Other Ambulatory Visit: Payer: Self-pay | Admitting: Cardiology

## 2022-10-02 DIAGNOSIS — C9000 Multiple myeloma not having achieved remission: Secondary | ICD-10-CM

## 2022-10-02 DIAGNOSIS — E538 Deficiency of other specified B group vitamins: Secondary | ICD-10-CM | POA: Diagnosis not present

## 2022-10-02 DIAGNOSIS — D649 Anemia, unspecified: Secondary | ICD-10-CM | POA: Diagnosis not present

## 2022-10-02 DIAGNOSIS — Z5112 Encounter for antineoplastic immunotherapy: Secondary | ICD-10-CM | POA: Diagnosis not present

## 2022-10-02 LAB — CBC WITH DIFFERENTIAL (CANCER CENTER ONLY)
Abs Immature Granulocytes: 0.02 10*3/uL (ref 0.00–0.07)
Basophils Absolute: 0 10*3/uL (ref 0.0–0.1)
Basophils Relative: 1 %
Eosinophils Absolute: 0.1 10*3/uL (ref 0.0–0.5)
Eosinophils Relative: 2 %
HCT: 33.8 % — ABNORMAL LOW (ref 36.0–46.0)
Hemoglobin: 11.2 g/dL — ABNORMAL LOW (ref 12.0–15.0)
Immature Granulocytes: 0 %
Lymphocytes Relative: 9 %
Lymphs Abs: 0.7 10*3/uL (ref 0.7–4.0)
MCH: 28 pg (ref 26.0–34.0)
MCHC: 33.1 g/dL (ref 30.0–36.0)
MCV: 84.5 fL (ref 80.0–100.0)
Monocytes Absolute: 1.2 10*3/uL — ABNORMAL HIGH (ref 0.1–1.0)
Monocytes Relative: 14 %
Neutro Abs: 6.1 10*3/uL (ref 1.7–7.7)
Neutrophils Relative %: 74 %
Platelet Count: 162 10*3/uL (ref 150–400)
RBC: 4 MIL/uL (ref 3.87–5.11)
RDW: 19.1 % — ABNORMAL HIGH (ref 11.5–15.5)
WBC Count: 8.1 10*3/uL (ref 4.0–10.5)
nRBC: 0 % (ref 0.0–0.2)

## 2022-10-02 LAB — CMP (CANCER CENTER ONLY)
ALT: 11 U/L (ref 0–44)
AST: 16 U/L (ref 15–41)
Albumin: 3.2 g/dL — ABNORMAL LOW (ref 3.5–5.0)
Alkaline Phosphatase: 73 U/L (ref 38–126)
Anion gap: 7 (ref 5–15)
BUN: 19 mg/dL (ref 8–23)
CO2: 26 mmol/L (ref 22–32)
Calcium: 8.5 mg/dL — ABNORMAL LOW (ref 8.9–10.3)
Chloride: 99 mmol/L (ref 98–111)
Creatinine: 0.62 mg/dL (ref 0.44–1.00)
GFR, Estimated: >60 mL/min (ref >60)
Glucose, Bld: 125 mg/dL — ABNORMAL HIGH (ref 70–99)
Potassium: 3.7 mmol/L (ref 3.5–5.1)
Sodium: 132 mmol/L — ABNORMAL LOW (ref 135–145)
Total Bilirubin: 0.6 mg/dL (ref 0.3–1.2)
Total Protein: 6.7 g/dL (ref 6.5–8.1)

## 2022-10-02 MED ORDER — PREGABALIN 25 MG PO CAPS: 25.0000 mg | ORAL_CAPSULE | Freq: Every evening | ORAL | 0 refills | Status: DC

## 2022-10-02 NOTE — Progress Notes (Signed)
Per I Thayill PA-C, patient will not receive treatment today.

## 2022-10-03 LAB — MULTIPLE MYELOMA PANEL, SERUM
Albumin SerPl Elph-Mcnc: 3.4 g/dL (ref 2.9–4.4)
Albumin/Glob SerPl: 1 (ref 0.7–1.7)
Alpha 1: 0.3 g/dL (ref 0.0–0.4)
Alpha2 Glob SerPl Elph-Mcnc: 0.6 g/dL (ref 0.4–1.0)
B-Globulin SerPl Elph-Mcnc: 0.6 g/dL — ABNORMAL LOW (ref 0.7–1.3)
Gamma Glob SerPl Elph-Mcnc: 2.3 g/dL — ABNORMAL HIGH (ref 0.4–1.8)
Globulin, Total: 3.7 g/dL (ref 2.2–3.9)
IgA: 9 mg/dL — ABNORMAL LOW (ref 64–422)
IgG (Immunoglobin G), Serum: 2725 mg/dL — ABNORMAL HIGH (ref 586–1602)
IgM (Immunoglobulin M), Srm: 13 mg/dL — ABNORMAL LOW (ref 26–217)
M Protein SerPl Elph-Mcnc: 2.2 g/dL — ABNORMAL HIGH
Total Protein ELP: 7.1 g/dL (ref 6.0–8.5)

## 2022-10-04 ENCOUNTER — Other Ambulatory Visit: Payer: Self-pay | Admitting: Physician Assistant

## 2022-10-04 ENCOUNTER — Telehealth: Payer: Self-pay | Admitting: *Deleted

## 2022-10-04 DIAGNOSIS — M79604 Pain in right leg: Secondary | ICD-10-CM

## 2022-10-04 NOTE — Telephone Encounter (Signed)
Received call from pt. She is very concerned that she is still having pain in her anterior upper thighs that travels down her legs. She states she had one night without pain but then last night the pain returned. Discussed with [name], PA  Murray Hodgkins will place order for MRI of pt's thoracic and lumbar spine. She also ordered that pt can increase her L:yrica to 25 mg BID. Pt voiced understanding. She is anxious to get the MRI done.

## 2022-10-05 ENCOUNTER — Encounter (INDEPENDENT_AMBULATORY_CARE_PROVIDER_SITE_OTHER): Payer: Medicare Other | Admitting: Ophthalmology

## 2022-10-06 DIAGNOSIS — M546 Pain in thoracic spine: Secondary | ICD-10-CM | POA: Diagnosis not present

## 2022-10-06 DIAGNOSIS — R2689 Other abnormalities of gait and mobility: Secondary | ICD-10-CM | POA: Diagnosis not present

## 2022-10-06 DIAGNOSIS — M545 Low back pain, unspecified: Secondary | ICD-10-CM | POA: Diagnosis not present

## 2022-10-06 DIAGNOSIS — M6281 Muscle weakness (generalized): Secondary | ICD-10-CM | POA: Diagnosis not present

## 2022-10-08 ENCOUNTER — Ambulatory Visit (HOSPITAL_COMMUNITY)
Admission: RE | Admit: 2022-10-08 | Discharge: 2022-10-08 | Disposition: A | Discharge status: Home / Self Care | Payer: Medicare Other | Source: Ambulatory Visit | Attending: Physician Assistant | Admitting: Physician Assistant

## 2022-10-08 DIAGNOSIS — M79604 Pain in right leg: Secondary | ICD-10-CM | POA: Diagnosis not present

## 2022-10-08 DIAGNOSIS — M79605 Pain in left leg: Secondary | ICD-10-CM | POA: Diagnosis not present

## 2022-10-08 DIAGNOSIS — M4316 Spondylolisthesis, lumbar region: Secondary | ICD-10-CM | POA: Diagnosis not present

## 2022-10-08 DIAGNOSIS — M545 Low back pain, unspecified: Secondary | ICD-10-CM | POA: Diagnosis not present

## 2022-10-08 DIAGNOSIS — M40204 Unspecified kyphosis, thoracic region: Secondary | ICD-10-CM | POA: Diagnosis not present

## 2022-10-08 DIAGNOSIS — M4126 Other idiopathic scoliosis, lumbar region: Secondary | ICD-10-CM | POA: Diagnosis not present

## 2022-10-08 MED ORDER — GADOBUTROL 1 MMOL/ML IV SOLN
7.0000 mL | Freq: Once | INTRAVENOUS | Status: AC | PRN
  Administered 2022-10-08: 7 mL via INTRAVENOUS

## 2022-10-09 DIAGNOSIS — M6281 Muscle weakness (generalized): Secondary | ICD-10-CM | POA: Diagnosis not present

## 2022-10-09 DIAGNOSIS — M546 Pain in thoracic spine: Secondary | ICD-10-CM | POA: Diagnosis not present

## 2022-10-09 DIAGNOSIS — R2689 Other abnormalities of gait and mobility: Secondary | ICD-10-CM | POA: Diagnosis not present

## 2022-10-09 DIAGNOSIS — M545 Low back pain, unspecified: Secondary | ICD-10-CM | POA: Diagnosis not present

## 2022-10-10 ENCOUNTER — Inpatient Hospital Stay: Payer: Medicare Other | Admitting: Hematology and Oncology

## 2022-10-10 ENCOUNTER — Inpatient Hospital Stay: Payer: Medicare Other

## 2022-10-10 ENCOUNTER — Telehealth: Payer: Self-pay | Admitting: Hematology and Oncology

## 2022-10-10 ENCOUNTER — Other Ambulatory Visit: Payer: Self-pay

## 2022-10-10 DIAGNOSIS — C9000 Multiple myeloma not having achieved remission: Secondary | ICD-10-CM

## 2022-10-10 DIAGNOSIS — D649 Anemia, unspecified: Secondary | ICD-10-CM | POA: Diagnosis not present

## 2022-10-10 DIAGNOSIS — E538 Deficiency of other specified B group vitamins: Secondary | ICD-10-CM | POA: Diagnosis not present

## 2022-10-10 DIAGNOSIS — Z5112 Encounter for antineoplastic immunotherapy: Secondary | ICD-10-CM | POA: Diagnosis not present

## 2022-10-10 LAB — CBC WITH DIFFERENTIAL (CANCER CENTER ONLY)
Abs Immature Granulocytes: 0.01 10*3/uL (ref 0.00–0.07)
Basophils Absolute: 0.1 10*3/uL (ref 0.0–0.1)
Basophils Relative: 1 %
Eosinophils Absolute: 0 10*3/uL (ref 0.0–0.5)
Eosinophils Relative: 1 %
HCT: 34 % — ABNORMAL LOW (ref 36.0–46.0)
Hemoglobin: 11.2 g/dL — ABNORMAL LOW (ref 12.0–15.0)
Immature Granulocytes: 0 %
Lymphocytes Relative: 13 %
Lymphs Abs: 0.9 10*3/uL (ref 0.7–4.0)
MCH: 28.2 pg (ref 26.0–34.0)
MCHC: 32.9 g/dL (ref 30.0–36.0)
MCV: 85.6 fL (ref 80.0–100.0)
Monocytes Absolute: 1.3 10*3/uL — ABNORMAL HIGH (ref 0.1–1.0)
Monocytes Relative: 20 %
Neutro Abs: 4.4 10*3/uL (ref 1.7–7.7)
Neutrophils Relative %: 65 %
Platelet Count: 336 10*3/uL (ref 150–400)
RBC: 3.97 MIL/uL (ref 3.87–5.11)
RDW: 18.9 % — ABNORMAL HIGH (ref 11.5–15.5)
WBC Count: 6.8 10*3/uL (ref 4.0–10.5)
nRBC: 0 % (ref 0.0–0.2)

## 2022-10-10 LAB — CMP (CANCER CENTER ONLY)
ALT: 13 U/L (ref 0–44)
AST: 20 U/L (ref 15–41)
Albumin: 2.9 g/dL — ABNORMAL LOW (ref 3.5–5.0)
Alkaline Phosphatase: 79 U/L (ref 38–126)
Anion gap: 10 (ref 5–15)
BUN: 16 mg/dL (ref 8–23)
CO2: 24 mmol/L (ref 22–32)
Calcium: 8.4 mg/dL — ABNORMAL LOW (ref 8.9–10.3)
Chloride: 100 mmol/L (ref 98–111)
Creatinine: 0.56 mg/dL (ref 0.44–1.00)
GFR, Estimated: >60 mL/min (ref >60)
Glucose, Bld: 119 mg/dL — ABNORMAL HIGH (ref 70–99)
Potassium: 3.4 mmol/L — ABNORMAL LOW (ref 3.5–5.1)
Sodium: 134 mmol/L — ABNORMAL LOW (ref 135–145)
Total Bilirubin: 0.5 mg/dL (ref 0.3–1.2)
Total Protein: 6.5 g/dL (ref 6.5–8.1)

## 2022-10-10 MED ORDER — BORTEZOMIB CHEMO SQ INJECTION 3.5 MG (2.5MG/ML)
1.3000 mg/m2 | Freq: Once | INTRAMUSCULAR | Status: AC
  Administered 2022-10-10: 2.25 mg via SUBCUTANEOUS
  Filled 2022-10-10: qty 0.9

## 2022-10-10 MED ORDER — DEXAMETHASONE 4 MG PO TABS: 20.0000 mg | ORAL_TABLET | Freq: Once | ORAL | Status: DC

## 2022-10-10 MED ORDER — DEXAMETHASONE 4 MG PO TABS
20.0000 mg | ORAL_TABLET | Freq: Once | ORAL | Status: AC
  Administered 2022-10-10: 20 mg via ORAL
  Filled 2022-10-10: qty 5

## 2022-10-10 NOTE — Progress Notes (Signed)
[place] [phone]) [phone]   [phone]  PROGRESS NOTE  Patient Care Team: [facility] as PCP - General (Rheumatology)  Hematological/Oncological History # IgG Lambda Multiple Myeloma  06/14/2022: establish care with [name] due to anemia. Labs showed M protein 4.9, Kappa 7.2, Lambda 10.8, ratio 0.67 07/13/2022: Bmbx showed Lambda restricted plasma cell neoplasm involving approximately 60% of the cellular marrow by IHC on the biopsy.  08/01/2022: Cycle 1 Day 1 of VRd chemotherapy. (Holding revlimid initially) 08/21/2022:  Cycle 2 Day 1 of VRd chemotherapy. (Holding revlimid) 09/04/2022: Cycle 3 Day 1 of VRd chemotherapy. Started revlimid 10/03/2022: Cycle 4 Day 1 of VRd chemotherapy  Interval History:  Zoe [name] 88 y.o. female with medical history significant for newly diagnosed multiple myeloma who presents for a follow up visit. The patient's last visit was on 09/25/2022. In the interim since the last visit she has continued VRd chemotherapy.   On exam today Zoe [name] reports she continues to struggle with back pain.  She reports that she does not have it all day but it does flareup.  She also develops worsening pain at night in her legs.  She is taking Lyrica twice daily but has not noticed a large improvement with this medication.  She reports it is mostly a burning that she is experiencing in her knees.  She reports that she has not been having any difficulties with her chemotherapy treatments and denies any nausea, vomiting, or diarrhea.  She notes her energy is "fair" but she gets a lot of help from her family.  She notes that physical therapy is doing some good in helping her with her pain..  Overall she is willing and able to proceed with treatment at this time.  She denies any fevers, chills, sweats, nausea, vomiting, diarrhea.  Full 10 point ROS was otherwise negative.    MEDICAL HISTORY:  Past Medical History:   Diagnosis Date   Arthritis    some - per patient   Breast cancer ([place])    breast cancer / left    Cataract    bilat    GERD (gastroesophageal reflux disease)    History of kidney stones    Hyperlipidemia    Hypertension    Hypothyroidism    Macular degeneration    Left   S/P TAVR (transcatheter aortic valve replacement) 09/03/2018   23 mm Edwards Sapien 3 transcatheter heart valve placed via percutaneous right transfemoral approach    Severe aortic stenosis    Stress incontinence    Thyroid disease    Tinnitus     SURGICAL HISTORY: Past Surgical History:  Procedure Laterality Date   ABDOMINAL HYSTERECTOMY  1970's   BACK SURGERY     BREAST LUMPECTOMY  12/1998   lumpectomy   CARDIAC CATHETERIZATION     EYE SURGERY     cataract surgery bilat    INTRAOPERATIVE TRANSTHORACIC ECHOCARDIOGRAM N/A 09/03/2018   Procedure: INTRAOPERATIVE TRANSTHORACIC ECHOCARDIOGRAM;  Surgeon: Burnell Blanks, MD;  Location: [facility];  Service: Open Heart Surgery;  Laterality: N/A;   KYPHOPLASTY N/A 09/07/2022   Procedure: THORACIC EIGHT KYPHOPLASTY;  Surgeon: Melina Schools, MD;  Location: [facility];  Service: Orthopedics;  Laterality: N/A;  1 hr Local with [facility] 3 C-Bed   LITHOTRIPSY     Right total knee     2018 Dr. Alvan Dame   RIGHT/LEFT HEART CATH AND CORONARY ANGIOGRAPHY N/A 08/06/2018   Procedure: RIGHT/LEFT HEART CATH AND CORONARY ANGIOGRAPHY;  Surgeon: Einar Gip,  Ulice Dash, MD;  Location: [facility];  Service: Cardiovascular;  Laterality: N/A;   THYROIDECTOMY, PARTIAL  1975   TONSILLECTOMY     as a child - patient not sure of exact date   TOTAL KNEE ARTHROPLASTY Left 03/13/2016   Procedure: TOTAL KNEE ARTHROPLASTY;  Surgeon: Paralee Cancel, MD;  Location: [facility];  Service: Orthopedics;  Laterality: Left;   TOTAL KNEE ARTHROPLASTY Right 06/18/2017   Procedure: RIGHT TOTAL KNEE ARTHROPLASTY;  Surgeon: Paralee Cancel, MD;  Location: [facility];  Service: Orthopedics;  Laterality: Right;    TRANSCATHETER AORTIC VALVE REPLACEMENT, TRANSFEMORAL N/A 09/03/2018   Procedure: TRANSCATHETER AORTIC VALVE REPLACEMENT, TRANSFEMORAL;  Surgeon: Burnell Blanks, MD;  Location: [facility];  Service: Open Heart Surgery;  Laterality: N/A;    SOCIAL HISTORY: Social History   Socioeconomic History   Marital status: Widowed    Spouse name: Not on file   Number of children: 4   Years of education: Not on file   Highest education level: Master's degree (e.g., MA, MS, MEng, MEd, MSW, MBA)  Occupational History   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  Tobacco Use   Smoking status: Never   Smokeless tobacco: Never  Vaping Use   Vaping Use: Never used  Substance and Sexual Activity   Alcohol use: No   Drug use: No   Sexual activity: Not Currently  Other Topics Concern   Not on file  Social History Narrative   Not on file   Social Determinants of Health   Financial Resource Strain: Low Risk  (11/12/2018)   Overall Financial Resource Strain (CARDIA)    Difficulty of Paying Living Expenses: Not very hard  Food Insecurity: No Food Insecurity (11/12/2018)   Hunger Vital Sign    Worried About Running Out of Food in the Last Year: Never true    Ran Out of Food in the Last Year: Never true  Transportation Needs: No Transportation Needs (11/12/2018)   PRAPARE - Hydrologist (Medical): No    Lack of Transportation (Non-Medical): No  Physical Activity: Inactive (11/12/2018)   Exercise Vital Sign    Days of Exercise per Week: 0 days    Minutes of Exercise per Session: 0 min  Stress: Stress Concern Present (11/12/2018)   [place]    Feeling of Stress : To some extent  Social Connections: Not on file  Intimate Partner Violence: Not on file    FAMILY HISTORY: Family History  Problem Relation Age of Onset   Diabetes Mother    Stroke Mother        Carotid artery  disease   Heart disease Father        CAD   Coronary artery disease Father    Diabetes Sister     ALLERGIES:  is allergic to penicillins and sulfa antibiotics.  MEDICATIONS:  Current Outpatient Medications  Medication Sig Dispense Refill   Artificial Tear Solution (SOOTHE XP) SOLN Place 1 drop into both eyes every evening.     aspirin EC 81 MG tablet Take 81 mg by mouth at bedtime. Swallow whole.     calcitonin, salmon, (MIACALCIN/FORTICAL) 200 UNIT/ACT nasal spray Place 1 spray into alternate nostrils daily.     carvedilol (COREG) 12.5 MG tablet Take 12.5 mg by mouth 2 (two) times daily with a meal.     Cholecalciferol (VITAMIN D3) 50 MCG (2000 UT) capsule Take 1 capsule (2,000 Units total) by  mouth daily.     cyanocobalamin (VITAMIN B12) 1000 MCG tablet Take 1,000 mcg by mouth daily.     dapagliflozin propanediol (FARXIGA) 10 MG TABS tablet TAKE 1 TABLET BY MOUTH EVERY DAY 90 tablet 2   dexamethasone (DECADRON) 4 MG tablet Take 20 mg by mouth daily. Take 20 mg (5 tablets) on day of multiple myeloma treatment     esomeprazole (NEXIUM) 20 MG capsule Take 20 mg by mouth daily as needed (Heartburn).     ferrous sulfate 325 (65 FE) MG tablet Take 325 mg by mouth daily with breakfast.     furosemide (LASIX) 20 MG tablet PLEASE SEE ATTACHED FOR DETAILED DIRECTIONS (Patient taking differently: Take 20 mg by mouth daily.) 90 tablet 1   levothyroxine (SYNTHROID, LEVOTHROID) 100 MCG tablet Take 100 mcg by mouth daily before breakfast.  2   losartan (COZAAR) 25 MG tablet Take 1 tablet (25 mg total) by mouth daily. 90 tablet 1   ondansetron (ZOFRAN) 8 MG tablet Take 1 tablet (8 mg total) by mouth every 8 (eight) hours as needed. (Patient taking differently: Take 8 mg by mouth every 8 (eight) hours as needed for nausea or vomiting.) 30 tablet 0   polyethylene glycol (MIRALAX / GLYCOLAX) 17 g packet Take 17 g by mouth daily as needed for mild constipation.     pravastatin (PRAVACHOL) 40 MG tablet Take  40 mg by mouth every evening.     pregabalin (LYRICA) 25 MG capsule Take 1 capsule (25 mg total) by mouth at bedtime. (Patient taking differently: Take 25 mg by mouth 2 (two) times daily.) 30 capsule 0   acyclovir (ZOVIRAX) 400 MG tablet Take 400 mg by mouth 2 (two) times daily. Pt takes 1 tablet (400 mg total) by mouth 2 (two) times daily.     HYDROcodone-acetaminophen (NORCO/VICODIN) 5-325 MG tablet Take 1 tablet by mouth 3 (three) times daily.     lenalidomide (REVLIMID) 25 MG capsule Take 1 capsule (25 mg total) by mouth daily. Take for 14 days, then none for 7 days. Repeat every 21 days. [employer] Auth # [id number] Date Obtained 09/29/22 (Patient not taking: Reported on 10/10/2022) 14 capsule 0   potassium chloride (KLOR-CON M) 10 MEQ tablet Take 2 tablets (20 mEq total) by mouth daily as needed. Take with furosemide (Patient taking differently: Take 10 mEq by mouth. Take with furosemide) 180 tablet 0   prochlorperazine (COMPAZINE) 10 MG tablet Take 1 tablet (10 mg total) by mouth every 6 (six) hours as needed for nausea or vomiting. (Patient not taking: Reported on 10/10/2022) 30 tablet 0   No current facility-administered medications for this visit.    REVIEW OF SYSTEMS:   Constitutional: ( - ) fevers, ( - )  chills , ( - ) night sweats Eyes: ( - ) blurriness of vision, ( - ) double vision, ( - ) watery eyes Ears, nose, mouth, throat, and face: ( - ) mucositis, ( - ) sore throat Respiratory: ( - ) cough, ( - ) dyspnea, ( - ) wheezes Cardiovascular: ( - ) palpitation, ( - ) chest discomfort, ( - ) lower extremity swelling Gastrointestinal:  ( - ) nausea, ( - ) heartburn, ( - ) change in bowel habits Skin: ( - ) abnormal skin rashes Lymphatics: ( - ) new lymphadenopathy, ( - ) easy bruising Neurological: ( - ) numbness, ( - ) tingling, ( - ) new weaknesses Behavioral/Psych: ( - ) mood change, ( - ) new changes  All other  systems were reviewed with the patient and are negative.  PHYSICAL  EXAMINATION: ECOG PERFORMANCE STATUS: 1 - Symptomatic but completely ambulatory  Vitals:   10/10/22 0916  BP: 110/67  Pulse: 74  Resp: 16  Temp: 97.9 F (36.6 C)  SpO2: 97%     Filed Weights   10/10/22 0916  Weight: 158 lb 12.8 oz (72 kg)      GENERAL: well appearing elderly Caucasian female, alert, no distress and comfortable SKIN: skin color, texture, turgor are normal, no rashes or significant lesions EYES: conjunctiva are pink and non-injected, sclera clear LUNGS: clear to auscultation and percussion with normal breathing effort HEART: regular rate & rhythm and no murmurs and no lower extremity edema Musculoskeletal: no cyanosis of digits and no clubbing  PSYCH: alert & oriented x 3, fluent speech NEURO: no focal motor/sensory deficits  LABORATORY DATA:  I have reviewed the data as listed    Latest Ref Rng & Units 10/10/2022    8:46 AM 10/02/2022    7:56 AM 09/30/2022    8:20 AM  CBC  WBC 4.0 - 10.5 K/uL 6.8  8.1  9.3   Hemoglobin 12.0 - 15.0 g/dL 11.2  11.2  11.4   Hematocrit 36.0 - 46.0 % 34.0  33.8  38.2   Platelets 150 - 400 K/uL 336  162  127        Latest Ref Rng & Units 10/10/2022    8:46 AM 10/02/2022    7:56 AM 09/30/2022    8:20 AM  CMP  Glucose 70 - 99 mg/dL 119  125  96   BUN 8 - 23 mg/dL _0 Creatinine 0.44 - 1.00 mg/dL 0.56  0.62  0.64   Sodium 135 - 145 mmol/L 134  132  131   Potassium 3.5 - 5.1 mmol/L 3.4  3.7  4.0   Chloride 98 - 111 mmol/L 100  99  98   CO2 22 - 32 mmol/L _1 Calcium 8.9 - 10.3 mg/dL 8.4  8.5  8.3   Total Protein 6.5 - 8.1 g/dL 6.5  6.7  6.8   Total Bilirubin 0.3 - 1.2 mg/dL 0.5  0.6  1.0   Alkaline Phos 38 - 126 U/L 79  73  62   AST 15 - 41 U/L _2 ALT 0 - 44 U/L _3 Lab Results  Component Value Date   MPROTEIN 2.2 (H) 09/25/2022   MPROTEIN 3.3 (H) 08/28/2022   MPROTEIN 4.9 (H) 08/01/2022   Lab Results  Component Value Date   KPAFRELGTCHN 8.7 09/25/2022   KPAFRELGTCHN  4.3 08/28/2022   KPAFRELGTCHN 6.3 08/01/2022   LAMBDASER 6.1 09/25/2022   LAMBDASER 6.2 08/28/2022   LAMBDASER 9.7 08/01/2022   KAPLAMBRATIO 1.43 09/25/2022   KAPLAMBRATIO 0.69 08/28/2022   KAPLAMBRATIO 0.65 08/01/2022    RADIOGRAPHIC STUDIES: MR Lumbar Spine W Wo Contrast  Result Date: 10/10/2022 CLINICAL DATA:  88 year old female with multiple myeloma. Pain. Prior T12 kyphoplasty. EXAM: MRI LUMBAR SPINE WITHOUT AND WITH CONTRAST TECHNIQUE: Multiplanar and multiecho pulse sequences of the lumbar spine were obtained without and with intravenous contrast. CONTRAST:  38m GADAVIST GADOBUTROL 1 MMOL/ML IV SOLN COMPARISON:  Thoracic MRI from the same day reported separately. Lumbar MRI 06/04/2019. Lumbar radiographs 08/21/2022. recent abdomen MRI 09/23/2022. FINDINGS: Segmentation: Normal, concordant with the thoracic spine numbering today. Alignment: Stable since 2020. Lumbar lordosis  with grade 1 anterolisthesis of L4 on L5 measuring 4-5 mm. Subtle lumbar scoliosis. Vertebrae: Thoracic levels through T12 are described separately today. L1 vertebral body heterogeneity appears mildly regressed since 2020. Mild compression there is stable. Similarly, increased homogeneous marrow signal in the L2 vertebral body. And L3 through L5 marrow signal is within normal limits. Visible sacrum and pelvis marrow signal mildly heterogeneous, fairly normal for age. No marrow edema or evidence of acute osseous abnormality. Conus medullaris and cauda equina: Conus extends to the T12-L1 level. No lower spinal cord or conus signal abnormality. No abnormal intradural enhancement. No dural thickening. Normal cauda equina nerve roots. Paraspinal and other soft tissues: Hepatic hemangioma as demonstrated on abdomen MRI earlier this month. Other visible abdominal viscera appear stable and normal for age. Lumbar paraspinal soft tissues are within normal limits. Disc levels: Chronic lumbar spine degeneration not significantly changed  from 2020, with capacious underlying spinal canal, and no significant lumbar spinal stenosis. IMPRESSION: 1. No acute or metastatic process identified in the lumbar spine. Decreased heterogeneity of the L1 and L2 vertebral bodies since 2020 might reflect regression of multiple myeloma there. 2. Lumbar spine degeneration not progressed since 2020, and superimposed on capacious spinal canal with no significant lumbar spinal stenosis. Electronically Signed   By: Genevie Ann M.D.   On: 10/10/2022 05:34   MR THORACIC SPINE W WO CONTRAST  Result Date: 10/10/2022 CLINICAL DATA:  88 year old female with multiple myeloma. Pain. Previous thoracic kyphoplasties, most recently 09/07/2022 at the T8 level. History of liver lesion evaluated on recent abdomen MRI. EXAM: MRI THORACIC WITHOUT AND WITH CONTRAST TECHNIQUE: Multiplanar and multiecho pulse sequences of the thoracic spine were obtained without and with intravenous contrast. CONTRAST:  51m GADAVIST GADOBUTROL 1 MMOL/ML IV SOLN COMPARISON:  Thoracic spine MRI 03/20/2017. Thoracic spine series 08/21/2022. Abdomen MRI 09/23/2022. FINDINGS: Limited cervical spine imaging: Age-appropriate, mild for age cervical spine degeneration. Cervical vertebral marrow signal seems to remain normal, no obvious cervical plasmacytoma. Thoracic spine segmentation:  Normal. Alignment: Thoracic kyphosis is stable since 2018. No significant scoliosis or spondylolisthesis. Vertebrae: Sequelae of T8 and T12 vertebral augmentation. Loss of height at both levels. Some T8 vertebral marrow edema persists. And there is deformity and enhancement of the T8 spinous process, interspinous ligament with T7 which most resembles a fracture (series 24, image 10). Adjacent T7 vertebral body confluent marrow edema and enhancement (series 24, image 13) suspicious for multiple myeloma infiltration in this setting, however, the subjacent right T7 inferior endplate also appears compressed on series 17, image 9.  Posterior element marrow signal at that level is normal. However, there is a similar 12 mm T4 vertebral body lesion decreased T1 marrow signal, patchy enhancement and edema compatible with multiple myeloma. No loss of height there. Smaller round roughly 8 mm T6 vertebral body ring-enhancing lesion. Other background thoracic vertebral marrow heterogeneity, but no other discrete vertebral mass or enhancement. Cord: Thoracic spinal cord remains normal despite degenerative changes. At many levels the thoracic thecal sac is capacious. conus medullaris is normal at T12-L1. No abnormal intradural enhancement. No dural thickening identified. Paraspinal and other soft tissues: Partially visible hepatic hemangioma on series 20, image 37 as detailed on abdomen MRI this month. Other appear visible thoracic and upper abdominal viscera negative. Thoracic paraspinal soft tissues appear negative aside from some soft tissue edema at T8. Disc levels: Notable levels of thoracic spine degeneration: T1-T2: Mild disc bulging. Moderate facet hypertrophy. No spinal stenosis. At least moderate bilateral T1 neural foraminal stenosis.  T4-T5: Small right paracentral disc or or disc osteophyte complex. No stenosis. T6-T7: Right paracentral disc or disc osteophyte complex. Mild mass effect on the right hemi cord here (series 20, image 22) but only borderline spinal stenosis. No foraminal stenosis. T8-T9 similar paracentral disc or disc osteophyte complex. Mild ligament flavum and facet hypertrophy here. Minimal right hemi cord mass effect. Only borderline spinal stenosis. Mild to moderate right T8 foraminal stenosis. T9-T10: Larger right lateral recess region disc protrusion. Mild facet and ligament flavum hypertrophy. Borderline spinal stenosis. No cord mass effect. Moderate to severe bilateral T9 foraminal stenosis. T11-T12: Small left paracentral disc protrusion. Mild to moderate facet hypertrophy. No significant stenosis. T12-L1: Disc space  loss with circumferential disc bulging and mild to moderate facet and ligament flavum hypertrophy. No spinal stenosis. Borderline to mild T12 foraminal stenosis. IMPRESSION: 1. Recent T8 and chronic T12 vertebral augmentation. T8 spinous process fracture with associated marrow edema, interspinous ligament edema. And residual T8 vertebral body edema. 2. Adjacent T7 vertebral body edema and enhancement is indeterminate for multiple myeloma with mild pathologic inferior endplate fracture, versus osteoporotic/reactive fracture secondary to T8 augmentation. No complicating features. 3. Discrete multiple myeloma lesions suspected at both the T4 and T6 vertebral bodies, but no pathologic fracture. 4. No thoracic spinal cord signal abnormality. Fairly age-appropriate thoracic spine degeneration with only borderline degenerative spinal stenosis at T6-T7 to T9-T10. Occasional pronounced degenerative degenerative neural foraminal stenosis, including at the bilateral T9 nerve levels. Electronically Signed   By: Genevie Ann M.D.   On: 10/10/2022 04:36   VAS [place] LOWER EXTREMITY VENOUS (DVT)  Result Date: 09/30/2022  Lower Venous DVT Study Patient Name:  Zoe [name]  Date of Exam:   09/30/2022 Medical Rec #: [id number]            Accession #:    [id number] Date of Birth: 1934-10-21             Patient Gender: F Patient Age:   89 years Exam Location:  [facility] Procedure:      VAS [place] LOWER EXTREMITY VENOUS (DVT) Referring Phys: Thelma Comp NANAVATI --------------------------------------------------------------------------------  Indications: Pain and swelling bilaterally.  Risk Factors: Cancer -multiple myeloma, on chemo History of CHF. Comparison Study: No prior studies. Performing Technologist: Darlin Coco RDMS, RVT  Examination Guidelines: A complete evaluation includes B-mode imaging, spectral Doppler, color Doppler, and power Doppler as needed of all accessible portions of each vessel. Bilateral testing is considered  an integral part of a complete examination. Limited examinations for reoccurring indications may be performed as noted. The reflux portion of the exam is performed with the patient in reverse Trendelenburg.  +---------+---------------+---------+-----------+----------+--------------+ RIGHT    CompressibilityPhasicitySpontaneityPropertiesThrombus Aging +---------+---------------+---------+-----------+----------+--------------+ CFV      Full           Yes      Yes                                 +---------+---------------+---------+-----------+----------+--------------+ SFJ      Full                                                        +---------+---------------+---------+-----------+----------+--------------+ FV Prox  Full                                                        +---------+---------------+---------+-----------+----------+--------------+  FV Mid   Full                                                        +---------+---------------+---------+-----------+----------+--------------+ FV DistalFull                                                        +---------+---------------+---------+-----------+----------+--------------+ PFV      Full                                                        +---------+---------------+---------+-----------+----------+--------------+ POP      Full           Yes      Yes                                 +---------+---------------+---------+-----------+----------+--------------+ PTV      Full                                                        +---------+---------------+---------+-----------+----------+--------------+ PERO     Full                                                        +---------+---------------+---------+-----------+----------+--------------+   +---------+---------------+---------+-----------+----------+--------------+ LEFT      CompressibilityPhasicitySpontaneityPropertiesThrombus Aging +---------+---------------+---------+-----------+----------+--------------+ CFV      Partial        Yes      Yes                                 +---------+---------------+---------+-----------+----------+--------------+ SFJ      Full                                                        +---------+---------------+---------+-----------+----------+--------------+ FV Prox  Full                                                        +---------+---------------+---------+-----------+----------+--------------+ FV Mid   Full                                                        +---------+---------------+---------+-----------+----------+--------------+  FV DistalFull                                                        +---------+---------------+---------+-----------+----------+--------------+ PFV      Full                                                        +---------+---------------+---------+-----------+----------+--------------+ POP      Full           Yes      Yes                                 +---------+---------------+---------+-----------+----------+--------------+ PTV      Full                                                        +---------+---------------+---------+-----------+----------+--------------+ PERO     Full                                                        +---------+---------------+---------+-----------+----------+--------------+     Summary: RIGHT: - There is no evidence of deep vein thrombosis in the lower extremity.  - No cystic structure found in the popliteal fossa.  LEFT: - There is no evidence of deep vein thrombosis in the lower extremity.  - No cystic structure found in the popliteal fossa.  *See table(s) above for measurements and observations. Electronically signed by Servando Snare MD on 09/30/2022 at 5:39:35 PM.    Final    DG Pelvis 1-2  Views  Result Date: 09/30/2022 CLINICAL DATA:  Multiple myeloma, pain EXAM: PELVIS - 1 VIEW; RIGHT FEMUR 4 VIEWS; LEFT FEMUR 4 VIEWS COMPARISON:  None Available. FINDINGS: There is no evidence of pelvic fracture or diastasis. No pelvic bone lesions are seen. Lumbosacral spondylitic changes noted. There is bilateral SI joint degenerative changes. Bilateral hip osteoarthritis with osteophytes and joint space narrowing. Images of the bilateral femora demonstrate no traumatic abnormalities. Patient is status post bilateral total knee arthroplasty. IMPRESSION: 1. 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 2. No acute osseous abnormalities identified. 3. Status post bilateral total knee arthroplasties. 4. No osteolytic lesions identified to correspond to patient's history of myeloma. Electronically Signed   By: Sammie Bench M.D.   On: 09/30/2022 09:07   DG Femur Min 2 Views Left  Result Date: 09/30/2022 CLINICAL DATA:  Multiple myeloma, pain EXAM: PELVIS - 1 VIEW; RIGHT FEMUR 4 VIEWS; LEFT FEMUR 4 VIEWS COMPARISON:  None Available. FINDINGS: There is no evidence of pelvic fracture or diastasis. No pelvic bone lesions are seen. Lumbosacral spondylitic changes noted. There is bilateral SI joint degenerative changes. Bilateral hip osteoarthritis with osteophytes and joint space narrowing. Images of the  bilateral femora demonstrate no traumatic abnormalities. Patient is status post bilateral total knee arthroplasty. IMPRESSION: 1. 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 2. No acute osseous abnormalities identified. 3. Status post bilateral total knee arthroplasties. 4. No osteolytic lesions identified to correspond to patient's history of myeloma. Electronically Signed   By: Sammie Bench M.D.   On: 09/30/2022 09:07   DG Femur Min 2 Views  Right  Result Date: 09/30/2022 CLINICAL DATA:  Multiple myeloma, pain EXAM: PELVIS - 1 VIEW; RIGHT FEMUR 4 VIEWS; LEFT FEMUR 4 VIEWS COMPARISON:  None Available. FINDINGS: There is no evidence of pelvic fracture or diastasis. No pelvic bone lesions are seen. Lumbosacral spondylitic changes noted. There is bilateral SI joint degenerative changes. Bilateral hip osteoarthritis with osteophytes and joint space narrowing. Images of the bilateral femora demonstrate no traumatic abnormalities. Patient is status post bilateral total knee arthroplasty. IMPRESSION: 1. 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 2. No acute osseous abnormalities identified. 3. Status post bilateral total knee arthroplasties. 4. No osteolytic lesions identified to correspond to patient's history of myeloma. Electronically Signed   By: Sammie Bench M.D.   On: 09/30/2022 09:07   PCV CAROTID DUPLEX (BILATERAL)  Result Date: 09/24/2022 Carotid artery duplex 09/21/2022: Duplex suggests stenosis in the right internal carotid artery (1-15%). Duplex suggests stenosis in the left internal carotid artery (16-49%). Antegrade right vertebral artery flow. Antegrade left vertebral artery flow. Compared to the study done on 02/15/2022, left ICA stenosis of >50% has now regressed.  Otherwise no significant change.  There is mild to moderate heterogenous plaque noted in the bilateral carotid arteries. Follow up in one year is appropriate if clinically indicated.   MR LIVER W WO CONTRAST  Result Date: 09/24/2022 CLINICAL DATA:  88 year old female with history of liver lesion. Follow-up study. EXAM: MRI ABDOMEN WITHOUT AND WITH CONTRAST TECHNIQUE: Multiplanar multisequence MR imaging of the abdomen was performed both before and after the administration of intravenous contrast. CONTRAST:  56m GADAVIST GADOBUTROL 1 MMOL/ML IV SOLN COMPARISON:  Abdominal MRI 08/09/2006.  CTA of the chest, abdomen and pelvis 08/15/2018. FINDINGS: Lower chest: Susceptibility artifact in the region of the aortic root, presumably from prior TAVR procedure. Hepatobiliary: In the right lobe of the liver centered predominantly in segment 8 (axial image 12 of series 6 and coronal image 12 of series 4) there is a T1 hypointense, T2 hyperintense lesion which demonstrates early peripheral nodular hyperenhancement with progressive centripetal filling on more delayed images, diagnostic of a cavernous hemangioma. No other suspicious appearing hepatic lesions are noted. No intra or extrahepatic biliary ductal dilatation. Common bile duct measures 5 mm in the porta hepatis. Gallbladder is normal in appearance. Pancreas: A few scattered tiny T1 hypointense, T2 hyperintense, nonenhancing lesions are noted throughout the body and tail of the pancreas measuring 4 mm or less in size (axial image 19 of series 6). These do not definitively communicate with the main pancreatic duct. Main pancreatic duct is normal in caliber. No solid pancreatic mass. No peripancreatic fluid collections or inflammatory changes. Spleen:  Unremarkable. Adrenals/Urinary Tract: Tiny subcentimeter T1 hypointense, T2 hyperintense, nonenhancing lesions in the right kidney are compatible with tiny simple cysts (Bosniak class 1, no imaging follow-up recommended). Left kidney and bilateral adrenal glands are otherwise normal in appearance. No hydroureteronephrosis in the visualized portions of the abdomen. Stomach/Bowel: Visualized portions are unremarkable. Vascular/Lymphatic: No aneurysm identified  in the visualized abdominal vasculature. No lymphadenopathy noted in the abdomen. Other: No significant volume of ascites noted in the visualized portions of the peritoneal cavity. Musculoskeletal: No aggressive appearing osseous lesions are noted in the visualized portions of the skeleton. IMPRESSION: 1. Large cavernous hemangioma in the liver, very  similar to prior examinations, as above. No aggressive lesions are noted. 2. Multiple tiny cystic appearing lesions in the pancreas measuring 4 mm or less in size in the body and tail, nonspecific, but statistically likely benign lesions such as small pancreatic pseudocysts or side branch IPMNs (intraductal papillary mucinous neoplasms). Repeat abdominal MRI with and without IV gadolinium with MRCP (or pancreatic protocol CT scan) is recommended in 2 years to ensure the stability or regression of these findings. This recommendation follows ACR consensus guidelines: Management of Incidental Pancreatic Cysts: A White Paper of the ACR Incidental Findings Committee. [place] 9675;91:[id number]. 3. Additional incidental findings, as above. Electronically Signed   By: Vinnie Langton M.D.   On: 09/24/2022 05:59    ASSESSMENT & PLAN [name] 88 y.o. female with medical history significant for multiple myeloma who presents for a follow up visit.   After review of the labs, review the records, discussion with the patient the findings are most consistent with an IgG lambda multiple myeloma.  The patient meets the diagnostic criteria based on 60% plasma cells in the bone marrow and anemia.  Additionally the patient was noted to have vitamin B12 deficiency which is currently being treated as well.  Given her degree of anemia would recommend proceeding with VD chemotherapy and will add Revlimid once hemoglobin improves.  The patient and her daughter voiced understanding of the plan moving forward.  # IgG Lambda Multiple Myeloma  --diagnosis of MM confirmed with bone marrow biopsy showing 60% plasma cells and anemia -- Bone survey shows no lytic lesions, kidney function is within normal limits.  --08/01/2022 was Cycle 1 Day 1 of Vd  Plan:  --Today is Cycle 4 Day 1 of Vd chemotherapy. Will add Revlimid as her Hgb has improved --Labs show white blood cell count 6.8, hemoglobin 11.2, MCV 85.6, and  platelets of 336..  Creatinine is 0.56 and LFTs are within normal limits. --at each visit will collect CBC, CMP, and LDH with monthly restaging labs SPEP and SFLC.  --RTC in 2 weeks with interval weekly treatment.   # Normocytic Anemia #Vitamin B12 Deficiency --Anemia likely driven by multiple myeloma, but may be component of vitamin B12 deficiency as well. --initial labs showed elevated MMA with B12 180 -- Continue vitamin B12 1000 mcg p.o. daily -- Continue to monitor  #Supportive Care -- chemotherapy education complete -- port placement not required.  --We will discuss starting Zometa/Xgeva at next clinic visit. -- zofran 20m q8H PRN and compazine 173mPO q6H for nausea -- acyclovir 40026mO BID for VCZ prophylaxis -- tylenol 1000 mg q8H PRN for back pain.   No orders of the defined types were placed in this encounter.   All questions were answered. The patient knows to call the clinic with any problems, questions or concerns.  A total of more than 30 minutes were spent on this encounter with face-to-face time and non-face-to-face time, including preparing to see the patient, ordering tests and/or medications, counseling the patient and coordination of care as outlined above.   JohLedell PeoplesD Department of Hematology/Oncology [facility]one: [phone]: [phone]: [email or web address]  10/10/2022  3:23 PM

## 2022-10-10 NOTE — Telephone Encounter (Signed)
Called patient to confirm future appointments. Patient notified.

## 2022-10-10 NOTE — Progress Notes (Signed)
Per Dr. Lorenso Courier OK to proceed with tx today without CMP result

## 2022-10-10 NOTE — Patient Instructions (Signed)
[facility] ONCOLOGY  Discharge Instructions: Thank you for choosing [facility] to provide your oncology and hematology care.   If you have a lab appointment with the [facility], please go directly to the [facility] and check in at the registration area.   Wear comfortable clothing and clothing appropriate for easy access to any Portacath or PICC line.   We strive to give you quality time with your provider. You may need to reschedule your appointment if you arrive late (15 or more minutes).  Arriving late affects you and other patients whose appointments are after yours.  Also, if you miss three or more appointments without notifying the office, you may be dismissed from the clinic at the provider's discretion.      For prescription refill requests, have your pharmacy contact our office and allow 72 hours for refills to be completed.    Today you received the following chemotherapy and/or immunotherapy agents: Velcade      To help prevent nausea and vomiting after your treatment, we encourage you to take your nausea medication as directed.  BELOW ARE SYMPTOMS THAT SHOULD BE REPORTED IMMEDIATELY: *FEVER GREATER THAN 100.4 F (38 C) OR HIGHER *CHILLS OR SWEATING *NAUSEA AND VOMITING THAT IS NOT CONTROLLED WITH YOUR NAUSEA MEDICATION *UNUSUAL SHORTNESS OF BREATH *UNUSUAL BRUISING OR BLEEDING *URINARY PROBLEMS (pain or burning when urinating, or frequent urination) *BOWEL PROBLEMS (unusual diarrhea, constipation, pain near the anus) TENDERNESS IN MOUTH AND THROAT WITH OR WITHOUT PRESENCE OF ULCERS (sore throat, sores in mouth, or a toothache) UNUSUAL RASH, SWELLING OR PAIN  UNUSUAL VAGINAL DISCHARGE OR ITCHING   Items with * indicate a potential emergency and should be followed up as soon as possible or go to the Emergency Department if any problems should occur.  Please show the CHEMOTHERAPY ALERT CARD or IMMUNOTHERAPY ALERT CARD at check-in to  the Emergency Department and triage nurse.  Should you have questions after your visit or need to cancel or reschedule your appointment, please contact [facility]  Dept: [phone]  and follow the prompts.  Office hours are 8:00 a.m. to 4:30 p.m. Monday - Friday. Please note that voicemails left after 4:00 p.m. may not be returned until the following business day.  We are closed weekends and major holidays. You have access to a nurse at all times for urgent questions. Please call the main number to the clinic Dept: [phone] and follow the prompts.   For any non-urgent questions, you may also contact your provider using MyChart. We now offer e-Visits for anyone 40 and older to request care online for non-urgent symptoms. For details visit [email or web address].   Also download the MyChart app! Go to the app store, search "MyChart", open the app, select [facility], and log in with your MyChart username and password.  Masks are optional in the cancer centers. If you would like for your care team to wear a mask while they are taking care of you, please let them know. You may have one support person who is at least 86 years old accompany you for your appointments.

## 2022-10-14 ENCOUNTER — Other Ambulatory Visit: Payer: Self-pay | Admitting: Physician Assistant

## 2022-10-17 ENCOUNTER — Ambulatory Visit: Payer: Medicare Other

## 2022-10-17 ENCOUNTER — Inpatient Hospital Stay: Payer: Medicare Other

## 2022-10-17 ENCOUNTER — Other Ambulatory Visit: Payer: Medicare Other

## 2022-10-17 ENCOUNTER — Other Ambulatory Visit: Payer: Self-pay | Admitting: *Deleted

## 2022-10-17 ENCOUNTER — Other Ambulatory Visit: Payer: Self-pay

## 2022-10-17 ENCOUNTER — Other Ambulatory Visit: Payer: Self-pay | Admitting: Hematology and Oncology

## 2022-10-17 VITALS — BP 154/76 | HR 71 | Temp 97.7°F | Resp 16 | Wt 155.5 lb

## 2022-10-17 DIAGNOSIS — D649 Anemia, unspecified: Secondary | ICD-10-CM | POA: Diagnosis not present

## 2022-10-17 DIAGNOSIS — Z5112 Encounter for antineoplastic immunotherapy: Secondary | ICD-10-CM | POA: Diagnosis not present

## 2022-10-17 DIAGNOSIS — C9000 Multiple myeloma not having achieved remission: Secondary | ICD-10-CM

## 2022-10-17 DIAGNOSIS — E538 Deficiency of other specified B group vitamins: Secondary | ICD-10-CM | POA: Diagnosis not present

## 2022-10-17 LAB — CMP (CANCER CENTER ONLY)
ALT: 11 U/L (ref 0–44)
AST: 15 U/L (ref 15–41)
Albumin: 3.4 g/dL — ABNORMAL LOW (ref 3.5–5.0)
Alkaline Phosphatase: 98 U/L (ref 38–126)
Anion gap: 7 (ref 5–15)
BUN: 14 mg/dL (ref 8–23)
CO2: 27 mmol/L (ref 22–32)
Calcium: 8.5 mg/dL — ABNORMAL LOW (ref 8.9–10.3)
Chloride: 96 mmol/L — ABNORMAL LOW (ref 98–111)
Creatinine: 0.59 mg/dL (ref 0.44–1.00)
GFR, Estimated: >60 mL/min (ref >60)
Glucose, Bld: 113 mg/dL — ABNORMAL HIGH (ref 70–99)
Potassium: 3.5 mmol/L (ref 3.5–5.1)
Sodium: 130 mmol/L — ABNORMAL LOW (ref 135–145)
Total Bilirubin: 0.7 mg/dL (ref 0.3–1.2)
Total Protein: 6.7 g/dL (ref 6.5–8.1)

## 2022-10-17 LAB — CBC WITH DIFFERENTIAL (CANCER CENTER ONLY)
Abs Immature Granulocytes: 0.02 10*3/uL (ref 0.00–0.07)
Basophils Absolute: 0.1 10*3/uL (ref 0.0–0.1)
Basophils Relative: 1 %
Eosinophils Absolute: 0.1 10*3/uL (ref 0.0–0.5)
Eosinophils Relative: 1 %
HCT: 36.2 % (ref 36.0–46.0)
Hemoglobin: 12.3 g/dL (ref 12.0–15.0)
Immature Granulocytes: 0 %
Lymphocytes Relative: 10 %
Lymphs Abs: 0.8 10*3/uL (ref 0.7–4.0)
MCH: 28.7 pg (ref 26.0–34.0)
MCHC: 34 g/dL (ref 30.0–36.0)
MCV: 84.6 fL (ref 80.0–100.0)
Monocytes Absolute: 0.8 10*3/uL (ref 0.1–1.0)
Monocytes Relative: 10 %
Neutro Abs: 6.4 10*3/uL (ref 1.7–7.7)
Neutrophils Relative %: 78 %
Platelet Count: 249 10*3/uL (ref 150–400)
RBC: 4.28 MIL/uL (ref 3.87–5.11)
RDW: 18.6 % — ABNORMAL HIGH (ref 11.5–15.5)
WBC Count: 8.2 10*3/uL (ref 4.0–10.5)
nRBC: 0 % (ref 0.0–0.2)

## 2022-10-17 MED ORDER — BORTEZOMIB CHEMO SQ INJECTION 3.5 MG (2.5MG/ML)
1.3000 mg/m2 | Freq: Once | INTRAMUSCULAR | Status: AC
  Administered 2022-10-17: 2.25 mg via SUBCUTANEOUS
  Filled 2022-10-17: qty 0.9

## 2022-10-17 MED ORDER — PREGABALIN 25 MG PO CAPS: 25.0000 mg | ORAL_CAPSULE | Freq: Two times a day (BID) | ORAL | 2 refills | Status: DC

## 2022-10-17 NOTE — Progress Notes (Signed)
Pt presented to inf today with c/o worsening nerve pain in her legs. Pt informed this RN that the pain starts in her upper thighs and travels down to her ankles. She also presented with new c/o ankle and leg swelling. This RN assessed Pt's legs and ankles. Upon assessment this RN noted approximately +2 pitting edema around the Pt's ankles and +1 pitting edema in her lower legs. Pt stated "this swelling concerns me since I do not eat a lot of sodium". This RN made Dr. Lorenso Courier aware.  This RN elevated Pt's legs and feet.  Dr. Lorenso Courier recommended Pt to wear compression socks daily and to elevate feet when sitting.

## 2022-10-17 NOTE — Patient Instructions (Signed)
[facility] ONCOLOGY  Discharge Instructions: Thank you for choosing [facility] to provide your oncology and hematology care.   If you have a lab appointment with the [facility], please go directly to the [facility] and check in at the registration area.   Wear comfortable clothing and clothing appropriate for easy access to any Portacath or PICC line.   We strive to give you quality time with your provider. You may need to reschedule your appointment if you arrive late (15 or more minutes).  Arriving late affects you and other patients whose appointments are after yours.  Also, if you miss three or more appointments without notifying the office, you may be dismissed from the clinic at the provider's discretion.      For prescription refill requests, have your pharmacy contact our office and allow 72 hours for refills to be completed.    Today you received the following chemotherapy and/or immunotherapy agents: Velcade      To help prevent nausea and vomiting after your treatment, we encourage you to take your nausea medication as directed.  BELOW ARE SYMPTOMS THAT SHOULD BE REPORTED IMMEDIATELY: *FEVER GREATER THAN 100.4 F (38 C) OR HIGHER *CHILLS OR SWEATING *NAUSEA AND VOMITING THAT IS NOT CONTROLLED WITH YOUR NAUSEA MEDICATION *UNUSUAL SHORTNESS OF BREATH *UNUSUAL BRUISING OR BLEEDING *URINARY PROBLEMS (pain or burning when urinating, or frequent urination) *BOWEL PROBLEMS (unusual diarrhea, constipation, pain near the anus) TENDERNESS IN MOUTH AND THROAT WITH OR WITHOUT PRESENCE OF ULCERS (sore throat, sores in mouth, or a toothache) UNUSUAL RASH, SWELLING OR PAIN  UNUSUAL VAGINAL DISCHARGE OR ITCHING   Items with * indicate a potential emergency and should be followed up as soon as possible or go to the Emergency Department if any problems should occur.  Please show the CHEMOTHERAPY ALERT CARD or IMMUNOTHERAPY ALERT CARD at check-in to  the Emergency Department and triage nurse.  Should you have questions after your visit or need to cancel or reschedule your appointment, please contact [facility]  Dept: [phone]  and follow the prompts.  Office hours are 8:00 a.m. to 4:30 p.m. Monday - Friday. Please note that voicemails left after 4:00 p.m. may not be returned until the following business day.  We are closed weekends and major holidays. You have access to a nurse at all times for urgent questions. Please call the main number to the clinic Dept: [phone] and follow the prompts.   For any non-urgent questions, you may also contact your provider using MyChart. We now offer e-Visits for anyone 66 and older to request care online for non-urgent symptoms. For details visit [email or web address].   Also download the MyChart app! Go to the app store, search "MyChart", open the app, select [facility], and log in with your MyChart username and password.  Masks are optional in the cancer centers. If you would like for your care team to wear a mask while they are taking care of you, please let them know. You may have one support person who is at least 86 years old accompany you for your appointments.

## 2022-10-17 NOTE — Progress Notes (Signed)
Pt informed this RN that she took her PO dexamethasone at home prior to infusion appointment today.

## 2022-10-18 ENCOUNTER — Other Ambulatory Visit: Payer: Self-pay | Admitting: Cardiology

## 2022-10-18 ENCOUNTER — Inpatient Hospital Stay: Payer: Medicare Other | Admitting: Licensed Clinical Social Worker

## 2022-10-18 DIAGNOSIS — M6281 Muscle weakness (generalized): Secondary | ICD-10-CM | POA: Diagnosis not present

## 2022-10-18 DIAGNOSIS — C9000 Multiple myeloma not having achieved remission: Secondary | ICD-10-CM

## 2022-10-18 DIAGNOSIS — M546 Pain in thoracic spine: Secondary | ICD-10-CM | POA: Diagnosis not present

## 2022-10-18 DIAGNOSIS — I5033 Acute on chronic diastolic (congestive) heart failure: Secondary | ICD-10-CM

## 2022-10-18 DIAGNOSIS — M545 Low back pain, unspecified: Secondary | ICD-10-CM | POA: Diagnosis not present

## 2022-10-18 DIAGNOSIS — R2689 Other abnormalities of gait and mobility: Secondary | ICD-10-CM | POA: Diagnosis not present

## 2022-10-18 NOTE — Progress Notes (Signed)
[facility]  Initial Assessment   Zoe [name] is a 88 y.o. year old female contacted by phone. Clinical Social Work was referred by medical provider for assessment of psychosocial needs.   SDOH (Social Determinants of Health) assessments performed: Yes   SDOH Screenings   Food Insecurity: No Food Insecurity (11/12/2018)  Transportation Needs: No Transportation Needs (11/12/2018)  Depression (PHQ2-9): Low Risk  (11/18/2018)  Financial Resource Strain: Low Risk  (11/12/2018)  Physical Activity: Inactive (11/12/2018)  Stress: Stress Concern Present (11/12/2018)  Tobacco Use: Low Risk  (09/30/2022)     Distress Screen completed: No     No data to display            Family/Social Information:  Housing Arrangement: patient lives alone; however, her grandson will be moving in with her in January to assist. Family members/support persons in your life? Pt reports being well supported by family, friends and church.  Pt did lose her husband approximately 5 months ago and the transition has been difficult.  Pt will be connecting w/ spiritual care at the cancer center for additional support. Transportation concerns: no  Employment: Retired .  Income source: Paediatric nurse concerns: No Type of concern: None Food access concerns: no Religious or spiritual practice: Yes-[employer] Currently in place:  none  Coping/ Adjustment to diagnosis: Patient understands treatment plan and what happens next? Yes, pt reports her pain medication was changed yesterday and for the first time in a long time pt was able to sleep through the night.  Medical team informed at pt's request. Concerns about diagnosis and/or treatment: Quality of life Patient reported stressors: Adjusting to my illness Hopes and/or priorities: Pt's priority is to continue treatment w/ the hope of a positive outcome Patient enjoys time with family/ friends Current coping skills/  strengths: Capable of independent living , Motivation for treatment/growth , and Supportive family/friends     SUMMARY: Current SDOH Barriers:  No barriers identified at this time.  Clinical Social Work Clinical Goal(s):  No clinical social work goals at this time  Interventions: Discussed common feeling and emotions when being diagnosed with cancer, and the importance of support during treatment Informed patient of the support team roles and support services at [facility] Provided [facility] contact information and encouraged patient to call with any questions or concerns Pt to connect w/ spiritual care at the [facility] center.   Follow Up Plan: Patient will contact CSW with any support or resource needs Patient verbalizes understanding of plan: Yes    Henriette Combs, LCSW

## 2022-10-24 ENCOUNTER — Inpatient Hospital Stay: Payer: Medicare Other | Attending: Physician Assistant

## 2022-10-24 ENCOUNTER — Ambulatory Visit: Payer: Medicare Other

## 2022-10-24 ENCOUNTER — Ambulatory Visit: Payer: Medicare Other | Admitting: Physician Assistant

## 2022-10-24 ENCOUNTER — Other Ambulatory Visit: Payer: Self-pay

## 2022-10-24 ENCOUNTER — Inpatient Hospital Stay: Payer: Medicare Other

## 2022-10-24 ENCOUNTER — Other Ambulatory Visit: Payer: Medicare Other

## 2022-10-24 ENCOUNTER — Inpatient Hospital Stay: Payer: Medicare Other | Admitting: Hematology and Oncology

## 2022-10-24 VITALS — BP 141/62 | HR 69 | Temp 97.7°F | Resp 15 | Wt 162.1 lb

## 2022-10-24 DIAGNOSIS — R0602 Shortness of breath: Secondary | ICD-10-CM | POA: Diagnosis not present

## 2022-10-24 DIAGNOSIS — E538 Deficiency of other specified B group vitamins: Secondary | ICD-10-CM | POA: Insufficient documentation

## 2022-10-24 DIAGNOSIS — Z5112 Encounter for antineoplastic immunotherapy: Secondary | ICD-10-CM | POA: Insufficient documentation

## 2022-10-24 DIAGNOSIS — C9000 Multiple myeloma not having achieved remission: Secondary | ICD-10-CM

## 2022-10-24 DIAGNOSIS — M79604 Pain in right leg: Secondary | ICD-10-CM

## 2022-10-24 DIAGNOSIS — D649 Anemia, unspecified: Secondary | ICD-10-CM | POA: Insufficient documentation

## 2022-10-24 DIAGNOSIS — M79605 Pain in left leg: Secondary | ICD-10-CM | POA: Diagnosis not present

## 2022-10-24 LAB — CBC WITH DIFFERENTIAL (CANCER CENTER ONLY)
Abs Immature Granulocytes: 0.05 10*3/uL (ref 0.00–0.07)
Basophils Absolute: 0 10*3/uL (ref 0.0–0.1)
Basophils Relative: 0 %
Eosinophils Absolute: 0.1 10*3/uL (ref 0.0–0.5)
Eosinophils Relative: 1 %
HCT: 35.8 % — ABNORMAL LOW (ref 36.0–46.0)
Hemoglobin: 12.2 g/dL (ref 12.0–15.0)
Immature Granulocytes: 0 %
Lymphocytes Relative: 5 %
Lymphs Abs: 0.6 10*3/uL — ABNORMAL LOW (ref 0.7–4.0)
MCH: 28.8 pg (ref 26.0–34.0)
MCHC: 34.1 g/dL (ref 30.0–36.0)
MCV: 84.6 fL (ref 80.0–100.0)
Monocytes Absolute: 0.7 10*3/uL (ref 0.1–1.0)
Monocytes Relative: 6 %
Neutro Abs: 10.4 10*3/uL — ABNORMAL HIGH (ref 1.7–7.7)
Neutrophils Relative %: 88 %
Platelet Count: 166 10*3/uL (ref 150–400)
RBC: 4.23 MIL/uL (ref 3.87–5.11)
RDW: 17.8 % — ABNORMAL HIGH (ref 11.5–15.5)
WBC Count: 11.9 10*3/uL — ABNORMAL HIGH (ref 4.0–10.5)
nRBC: 0 % (ref 0.0–0.2)

## 2022-10-24 LAB — CMP (CANCER CENTER ONLY)
ALT: 17 U/L (ref 0–44)
AST: 21 U/L (ref 15–41)
Albumin: 3.3 g/dL — ABNORMAL LOW (ref 3.5–5.0)
Alkaline Phosphatase: 73 U/L (ref 38–126)
Anion gap: 7 (ref 5–15)
BUN: 14 mg/dL (ref 8–23)
CO2: 29 mmol/L (ref 22–32)
Calcium: 8.4 mg/dL — ABNORMAL LOW (ref 8.9–10.3)
Chloride: 91 mmol/L — ABNORMAL LOW (ref 98–111)
Creatinine: 0.45 mg/dL (ref 0.44–1.00)
GFR, Estimated: >60 mL/min (ref >60)
Glucose, Bld: 130 mg/dL — ABNORMAL HIGH (ref 70–99)
Potassium: 3.4 mmol/L — ABNORMAL LOW (ref 3.5–5.1)
Sodium: 127 mmol/L — ABNORMAL LOW (ref 135–145)
Total Bilirubin: 0.5 mg/dL (ref 0.3–1.2)
Total Protein: 6.6 g/dL (ref 6.5–8.1)

## 2022-10-24 MED ORDER — DEXAMETHASONE 4 MG PO TABS: 20.0000 mg | ORAL_TABLET | ORAL | 5 refills | Status: DC

## 2022-10-24 MED ORDER — BORTEZOMIB CHEMO SQ INJECTION 3.5 MG (2.5MG/ML)
1.3000 mg/m2 | Freq: Once | INTRAMUSCULAR | Status: AC
  Administered 2022-10-24: 2.25 mg via SUBCUTANEOUS
  Filled 2022-10-24: qty 0.9

## 2022-10-24 NOTE — Patient Instructions (Signed)
[facility] ONCOLOGY  Discharge Instructions: Thank you for choosing [facility] to provide your oncology and hematology care.   If you have a lab appointment with the [facility], please go directly to the [facility] and check in at the registration area.   Wear comfortable clothing and clothing appropriate for easy access to any Portacath or PICC line.   We strive to give you quality time with your provider. You may need to reschedule your appointment if you arrive late (15 or more minutes).  Arriving late affects you and other patients whose appointments are after yours.  Also, if you miss three or more appointments without notifying the office, you may be dismissed from the clinic at the provider's discretion.      For prescription refill requests, have your pharmacy contact our office and allow 72 hours for refills to be completed.    Today you received the following chemotherapy and/or immunotherapy agents: Velcade      To help prevent nausea and vomiting after your treatment, we encourage you to take your nausea medication as directed.  BELOW ARE SYMPTOMS THAT SHOULD BE REPORTED IMMEDIATELY: *FEVER GREATER THAN 100.4 F (38 C) OR HIGHER *CHILLS OR SWEATING *NAUSEA AND VOMITING THAT IS NOT CONTROLLED WITH YOUR NAUSEA MEDICATION *UNUSUAL SHORTNESS OF BREATH *UNUSUAL BRUISING OR BLEEDING *URINARY PROBLEMS (pain or burning when urinating, or frequent urination) *BOWEL PROBLEMS (unusual diarrhea, constipation, pain near the anus) TENDERNESS IN MOUTH AND THROAT WITH OR WITHOUT PRESENCE OF ULCERS (sore throat, sores in mouth, or a toothache) UNUSUAL RASH, SWELLING OR PAIN  UNUSUAL VAGINAL DISCHARGE OR ITCHING   Items with * indicate a potential emergency and should be followed up as soon as possible or go to the Emergency Department if any problems should occur.  Please show the CHEMOTHERAPY ALERT CARD or IMMUNOTHERAPY ALERT CARD at check-in to  the Emergency Department and triage nurse.  Should you have questions after your visit or need to cancel or reschedule your appointment, please contact [facility]  Dept: [phone]  and follow the prompts.  Office hours are 8:00 a.m. to 4:30 p.m. Monday - Friday. Please note that voicemails left after 4:00 p.m. may not be returned until the following business day.  We are closed weekends and major holidays. You have access to a nurse at all times for urgent questions. Please call the main number to the clinic Dept: [phone] and follow the prompts.   For any non-urgent questions, you may also contact your provider using MyChart. We now offer e-Visits for anyone 55 and older to request care online for non-urgent symptoms. For details visit [email or web address].   Also download the MyChart app! Go to the app store, search "MyChart", open the app, select [facility], and log in with your MyChart username and password.

## 2022-10-24 NOTE — Progress Notes (Signed)
[place] [phone]) [phone]   [phone]  PROGRESS NOTE  Patient Care Team: [facility] as PCP - General (Rheumatology)  Hematological/Oncological History # IgG Lambda Multiple Myeloma  06/14/2022: establish care with [name] due to anemia. Labs showed M protein 4.9, Kappa 7.2, Lambda 10.8, ratio 0.67 07/13/2022: Bmbx showed Lambda restricted plasma cell neoplasm involving approximately 60% of the cellular marrow by IHC on the biopsy.  08/01/2022: Cycle 1 Day 1 of VRd chemotherapy. (Holding revlimid initially) 08/21/2022:  Cycle 2 Day 1 of VRd chemotherapy. (Holding revlimid) 09/04/2022: Cycle 3 Day 1 of VRd chemotherapy. Started revlimid 10/03/2022: Cycle 4 Day 1 of VRd chemotherapy 10/31/2022: anticipated Cycle 5 Day 1 of VRd chemotherapy  Interval History:  [place] B Cooks 89 y.o. female with medical history significant for newly diagnosed multiple myeloma who presents for a follow up visit. The patient's last visit was on 10/10/2022. In the interim since the last visit she has continued VRd chemotherapy.   On exam today Mrs. Stockley reports she is having better control of her back and leg pain with her to gabapentin at night and 1 in the morning.  She reports her biggest problem currently is shortness of breath.  She notes that it can keep her awake at night and she has to sit up.  She reports that from 2 AM to 8 AM this morning she had to get up every hour due to persistent shortness of breath.  She reports that she is not having any cough though she did recently have an episode of an upper respiratory infection.  She notes she only sleeps on a single pillow.  She does have some occasional heartburn-like pain and it was recommended that she restart her antacid medication.  She reports that when she sits up and does deep breathing she is able to catch her breath without difficulty.  She continues to have neuropathy in the feet.  She  reports that she is not struggling with any nausea, vomiting, or diarrhea.  She is wearing compression socks which is helping with her lower extremity edema.  She denies any fevers, chills, sweats, nausea, vomiting, diarrhea.  Full 10 point ROS was otherwise negative.    MEDICAL HISTORY:  Past Medical History:  Diagnosis Date   Arthritis    some - per patient   Breast cancer ([place])    breast cancer / left    Cataract    bilat    GERD (gastroesophageal reflux disease)    History of kidney stones    Hyperlipidemia    Hypertension    Hypothyroidism    Macular degeneration    Left   S/P TAVR (transcatheter aortic valve replacement) 09/03/2018   23 mm Edwards Sapien 3 transcatheter heart valve placed via percutaneous right transfemoral approach    Severe aortic stenosis    Stress incontinence    Thyroid disease    Tinnitus     SURGICAL HISTORY: Past Surgical History:  Procedure Laterality Date   ABDOMINAL HYSTERECTOMY  1970's   BACK SURGERY     BREAST LUMPECTOMY  12/1998   lumpectomy   CARDIAC CATHETERIZATION     EYE SURGERY     cataract surgery bilat    INTRAOPERATIVE TRANSTHORACIC ECHOCARDIOGRAM N/A 09/03/2018   Procedure: INTRAOPERATIVE TRANSTHORACIC ECHOCARDIOGRAM;  Surgeon: Burnell Blanks, MD;  Location: [facility];  Service: Open Heart Surgery;  Laterality: N/A;   KYPHOPLASTY N/A 09/07/2022   Procedure: THORACIC EIGHT KYPHOPLASTY;  Surgeon: Melina Schools, MD;  Location: [facility];  Service: Orthopedics;  Laterality: N/A;  1 hr Local with [facility] 3 C-Bed   LITHOTRIPSY     Right total knee     2018 Dr. Alvan Dame   RIGHT/LEFT HEART CATH AND CORONARY ANGIOGRAPHY N/A 08/06/2018   Procedure: RIGHT/LEFT HEART CATH AND CORONARY ANGIOGRAPHY;  Surgeon: Adrian Prows, MD;  Location: [facility];  Service: Cardiovascular;  Laterality: N/A;   THYROIDECTOMY, PARTIAL  1975   TONSILLECTOMY     as a child - patient not sure of exact date   TOTAL KNEE ARTHROPLASTY Left 03/13/2016    Procedure: TOTAL KNEE ARTHROPLASTY;  Surgeon: Paralee Cancel, MD;  Location: [facility];  Service: Orthopedics;  Laterality: Left;   TOTAL KNEE ARTHROPLASTY Right 06/18/2017   Procedure: RIGHT TOTAL KNEE ARTHROPLASTY;  Surgeon: Paralee Cancel, MD;  Location: [facility];  Service: Orthopedics;  Laterality: Right;   TRANSCATHETER AORTIC VALVE REPLACEMENT, TRANSFEMORAL N/A 09/03/2018   Procedure: TRANSCATHETER AORTIC VALVE REPLACEMENT, TRANSFEMORAL;  Surgeon: Burnell Blanks, MD;  Location: [facility];  Service: Open Heart Surgery;  Laterality: N/A;    SOCIAL HISTORY: Social History   Socioeconomic History   Marital status: Widowed    Spouse name: Not on file   Number of children: 4   Years of education: Not on file   Highest education level: Master's degree (e.g., MA, MS, MEng, MEd, MSW, MBA)  Occupational History   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  Tobacco Use   Smoking status: Never   Smokeless tobacco: Never  Vaping Use   Vaping Use: Never used  Substance and Sexual Activity   Alcohol use: No   Drug use: No   Sexual activity: Not Currently  Other Topics Concern   Not on file  Social History Narrative   Not on file   Social Determinants of Health   Financial Resource Strain: Low Risk  (11/12/2018)   Overall Financial Resource Strain (CARDIA)    Difficulty of Paying Living Expenses: Not very hard  Food Insecurity: No Food Insecurity (11/12/2018)   Hunger Vital Sign    Worried About Running Out of Food in the Last Year: Never true    Ran Out of Food in the Last Year: Never true  Transportation Needs: No Transportation Needs (11/12/2018)   PRAPARE - Hydrologist (Medical): No    Lack of Transportation (Non-Medical): No  Physical Activity: Inactive (11/12/2018)   Exercise Vital Sign    Days of Exercise per Week: 0 days    Minutes of Exercise per Session: 0 min  Stress: Stress Concern Present (11/12/2018)   [place]    Feeling of Stress : To some extent  Social Connections: Not on file  Intimate Partner Violence: Not on file    FAMILY HISTORY: Family History  Problem Relation Age of Onset   Diabetes Mother    Stroke Mother        Carotid artery disease   Heart disease Father        CAD   Coronary artery disease Father    Diabetes Sister     ALLERGIES:  is allergic to penicillins and sulfa antibiotics.  MEDICATIONS:  Current Outpatient Medications  Medication Sig Dispense Refill   acyclovir (ZOVIRAX) 400 MG tablet Take 400 mg by mouth 2 (two) times daily. Pt takes 1 tablet (400 mg total) by mouth 2 (two) times daily.     Artificial Tear  Solution (SOOTHE XP) SOLN Place 1 drop into both eyes every evening.     aspirin EC 81 MG tablet Take 81 mg by mouth at bedtime. Swallow whole.     calcitonin, salmon, (MIACALCIN/FORTICAL) 200 UNIT/ACT nasal spray Place 1 spray into alternate nostrils daily.     carvedilol (COREG) 12.5 MG tablet Take 12.5 mg by mouth 2 (two) times daily with a meal.     Cholecalciferol (VITAMIN D3) 50 MCG (2000 UT) capsule Take 1 capsule (2,000 Units total) by mouth daily.     cyanocobalamin (VITAMIN B12) 1000 MCG tablet Take 1,000 mcg by mouth daily.     dapagliflozin propanediol (FARXIGA) 10 MG TABS tablet TAKE 1 TABLET BY MOUTH EVERY DAY 90 tablet 2   dexamethasone (DECADRON) 4 MG tablet Take 5 tablets (20 mg total) by mouth once a week. Take 20 mg (5 tablets) on day of multiple myeloma treatment 20 tablet 5   esomeprazole (NEXIUM) 20 MG capsule Take 20 mg by mouth daily as needed (Heartburn).     ferrous sulfate 325 (65 FE) MG tablet Take 325 mg by mouth daily with breakfast.     furosemide (LASIX) 20 MG tablet PLEASE SEE ATTACHED FOR DETAILED DIRECTIONS (Patient taking differently: Take 20 mg by mouth daily.) 90 tablet 1   HYDROcodone-acetaminophen (NORCO/VICODIN) 5-325 MG tablet Take 1 tablet by mouth 3  (three) times daily.     KLOR-CON M10 10 MEQ tablet TAKE 2 TABLETS BY MOUTH EVERY DAY AS NEEDED WITH FUROSEMIDE 180 tablet 0   lenalidomide (REVLIMID) 25 MG capsule Take 1 capsule (25 mg total) by mouth daily. Take for 14 days, then none for 7 days. Repeat every 21 days. [employer] Auth # [id number] Date Obtained 09/29/22 (Patient not taking: Reported on 10/10/2022) 14 capsule 0   levothyroxine (SYNTHROID, LEVOTHROID) 100 MCG tablet Take 100 mcg by mouth daily before breakfast.  2   losartan (COZAAR) 25 MG tablet Take 1 tablet (25 mg total) by mouth daily. 90 tablet 1   ondansetron (ZOFRAN) 8 MG tablet Take 1 tablet (8 mg total) by mouth every 8 (eight) hours as needed. (Patient taking differently: Take 8 mg by mouth every 8 (eight) hours as needed for nausea or vomiting.) 30 tablet 0   polyethylene glycol (MIRALAX / GLYCOLAX) 17 g packet Take 17 g by mouth daily as needed for mild constipation.     pravastatin (PRAVACHOL) 40 MG tablet Take 40 mg by mouth every evening.     pregabalin (LYRICA) 25 MG capsule Take 1 capsule (25 mg total) by mouth 2 (two) times daily. 60 capsule 2   prochlorperazine (COMPAZINE) 10 MG tablet Take 1 tablet (10 mg total) by mouth every 6 (six) hours as needed for nausea or vomiting. (Patient not taking: Reported on 10/10/2022) 30 tablet 0   No current facility-administered medications for this visit.    REVIEW OF SYSTEMS:   Constitutional: ( - ) fevers, ( - )  chills , ( - ) night sweats Eyes: ( - ) blurriness of vision, ( - ) double vision, ( - ) watery eyes Ears, nose, mouth, throat, and face: ( - ) mucositis, ( - ) sore throat Respiratory: ( - ) cough, ( - ) dyspnea, ( - ) wheezes Cardiovascular: ( - ) palpitation, ( - ) chest discomfort, ( - ) lower extremity swelling Gastrointestinal:  ( - ) nausea, ( - ) heartburn, ( - ) change in bowel habits Skin: ( - ) abnormal skin rashes Lymphatics: ( - )  new lymphadenopathy, ( - ) easy bruising Neurological: ( - ) numbness, (  - ) tingling, ( - ) new weaknesses Behavioral/Psych: ( - ) mood change, ( - ) new changes  All other systems were reviewed with the patient and are negative.  PHYSICAL EXAMINATION: ECOG PERFORMANCE STATUS: 1 - Symptomatic but completely ambulatory  Vitals:   10/24/22 1055  BP: (!) 141/62  Pulse: 69  Resp: 15  Temp: 97.7 F (36.5 C)  SpO2: 95%     Filed Weights   10/24/22 1055  Weight: 162 lb 1.6 oz (73.5 kg)      GENERAL: well appearing elderly Caucasian female, alert, no distress and comfortable SKIN: skin color, texture, turgor are normal, no rashes or significant lesions EYES: conjunctiva are pink and non-injected, sclera clear LUNGS: clear to auscultation and percussion with normal breathing effort HEART: regular rate & rhythm and no murmurs and no lower extremity edema Musculoskeletal: no cyanosis of digits and no clubbing  PSYCH: alert & oriented x 3, fluent speech NEURO: no focal motor/sensory deficits  LABORATORY DATA:  I have reviewed the data as listed    Latest Ref Rng & Units 10/24/2022   10:28 AM 10/17/2022    8:41 AM 10/10/2022    8:46 AM  CBC  WBC 4.0 - 10.5 K/uL 11.9  8.2  6.8   Hemoglobin 12.0 - 15.0 g/dL 12.2  12.3  11.2   Hematocrit 36.0 - 46.0 % 35.8  36.2  34.0   Platelets 150 - 400 K/uL 166  249  336        Latest Ref Rng & Units 10/24/2022   10:28 AM 10/17/2022    8:41 AM 10/10/2022    8:46 AM  CMP  Glucose 70 - 99 mg/dL 130  113  119   BUN 8 - 23 mg/dL _0 Creatinine 0.44 - 1.00 mg/dL 0.45  0.59  0.56   Sodium 135 - 145 mmol/L 127  130  134   Potassium 3.5 - 5.1 mmol/L 3.4  3.5  3.4   Chloride 98 - 111 mmol/L 91  96  100   CO2 22 - 32 mmol/L _1 Calcium 8.9 - 10.3 mg/dL 8.4  8.5  8.4   Total Protein 6.5 - 8.1 g/dL 6.6  6.7  6.5   Total Bilirubin 0.3 - 1.2 mg/dL 0.5  0.7  0.5   Alkaline Phos 38 - 126 U/L 73  98  79   AST 15 - 41 U/L _2 ALT 0 - 44 U/L _3 Lab Results  Component Value Date    MPROTEIN 2.2 (H) 09/25/2022   MPROTEIN 3.3 (H) 08/28/2022   MPROTEIN 4.9 (H) 08/01/2022   Lab Results  Component Value Date   KPAFRELGTCHN 8.7 09/25/2022   KPAFRELGTCHN 4.3 08/28/2022   KPAFRELGTCHN 6.3 08/01/2022   LAMBDASER 6.1 09/25/2022   LAMBDASER 6.2 08/28/2022   LAMBDASER 9.7 08/01/2022   KAPLAMBRATIO 1.43 09/25/2022   KAPLAMBRATIO 0.69 08/28/2022   KAPLAMBRATIO 0.65 08/01/2022    RADIOGRAPHIC STUDIES: MR Lumbar Spine W Wo Contrast  Result Date: 10/10/2022 CLINICAL DATA:  89 year old female with multiple myeloma. Pain. Prior T12 kyphoplasty. EXAM: MRI LUMBAR SPINE WITHOUT AND WITH CONTRAST TECHNIQUE: Multiplanar and multiecho pulse sequences of the lumbar spine were obtained without and with intravenous contrast. CONTRAST:  8m GADAVIST GADOBUTROL 1 MMOL/ML IV SOLN COMPARISON:  Thoracic MRI from the same day reported separately. Lumbar MRI 06/04/2019. Lumbar radiographs 08/21/2022. recent abdomen MRI 09/23/2022. FINDINGS: Segmentation: Normal, concordant with the thoracic spine numbering today. Alignment: Stable since 2020. Lumbar lordosis with grade 1 anterolisthesis of L4 on L5 measuring 4-5 mm. Subtle lumbar scoliosis. Vertebrae: Thoracic levels through T12 are described separately today. L1 vertebral body heterogeneity appears mildly regressed since 2020. Mild compression there is stable. Similarly, increased homogeneous marrow signal in the L2 vertebral body. And L3 through L5 marrow signal is within normal limits. Visible sacrum and pelvis marrow signal mildly heterogeneous, fairly normal for age. No marrow edema or evidence of acute osseous abnormality. Conus medullaris and cauda equina: Conus extends to the T12-L1 level. No lower spinal cord or conus signal abnormality. No abnormal intradural enhancement. No dural thickening. Normal cauda equina nerve roots. Paraspinal and other soft tissues: Hepatic hemangioma as demonstrated on abdomen MRI earlier this month. Other visible  abdominal viscera appear stable and normal for age. Lumbar paraspinal soft tissues are within normal limits. Disc levels: Chronic lumbar spine degeneration not significantly changed from 2020, with capacious underlying spinal canal, and no significant lumbar spinal stenosis. IMPRESSION: 1. No acute or metastatic process identified in the lumbar spine. Decreased heterogeneity of the L1 and L2 vertebral bodies since 2020 might reflect regression of multiple myeloma there. 2. Lumbar spine degeneration not progressed since 2020, and superimposed on capacious spinal canal with no significant lumbar spinal stenosis. Electronically Signed   By: Genevie Ann M.D.   On: 10/10/2022 05:34   MR THORACIC SPINE W WO CONTRAST  Result Date: 10/10/2022 CLINICAL DATA:  89 year old female with multiple myeloma. Pain. Previous thoracic kyphoplasties, most recently 09/07/2022 at the T8 level. History of liver lesion evaluated on recent abdomen MRI. EXAM: MRI THORACIC WITHOUT AND WITH CONTRAST TECHNIQUE: Multiplanar and multiecho pulse sequences of the thoracic spine were obtained without and with intravenous contrast. CONTRAST:  57m GADAVIST GADOBUTROL 1 MMOL/ML IV SOLN COMPARISON:  Thoracic spine MRI 03/20/2017. Thoracic spine series 08/21/2022. Abdomen MRI 09/23/2022. FINDINGS: Limited cervical spine imaging: Age-appropriate, mild for age cervical spine degeneration. Cervical vertebral marrow signal seems to remain normal, no obvious cervical plasmacytoma. Thoracic spine segmentation:  Normal. Alignment: Thoracic kyphosis is stable since 2018. No significant scoliosis or spondylolisthesis. Vertebrae: Sequelae of T8 and T12 vertebral augmentation. Loss of height at both levels. Some T8 vertebral marrow edema persists. And there is deformity and enhancement of the T8 spinous process, interspinous ligament with T7 which most resembles a fracture (series 24, image 10). Adjacent T7 vertebral body confluent marrow edema and enhancement  (series 24, image 13) suspicious for multiple myeloma infiltration in this setting, however, the subjacent right T7 inferior endplate also appears compressed on series 17, image 9. Posterior element marrow signal at that level is normal. However, there is a similar 12 mm T4 vertebral body lesion decreased T1 marrow signal, patchy enhancement and edema compatible with multiple myeloma. No loss of height there. Smaller round roughly 8 mm T6 vertebral body ring-enhancing lesion. Other background thoracic vertebral marrow heterogeneity, but no other discrete vertebral mass or enhancement. Cord: Thoracic spinal cord remains normal despite degenerative changes. At many levels the thoracic thecal sac is capacious. conus medullaris is normal at T12-L1. No abnormal intradural enhancement. No dural thickening identified. Paraspinal and other soft tissues: Partially visible hepatic hemangioma on series 20, image 37 as detailed on abdomen MRI this month. Other appear visible thoracic and upper abdominal viscera negative. Thoracic paraspinal soft tissues appear negative  aside from some soft tissue edema at T8. Disc levels: Notable levels of thoracic spine degeneration: T1-T2: Mild disc bulging. Moderate facet hypertrophy. No spinal stenosis. At least moderate bilateral T1 neural foraminal stenosis. T4-T5: Small right paracentral disc or or disc osteophyte complex. No stenosis. T6-T7: Right paracentral disc or disc osteophyte complex. Mild mass effect on the right hemi cord here (series 20, image 22) but only borderline spinal stenosis. No foraminal stenosis. T8-T9 similar paracentral disc or disc osteophyte complex. Mild ligament flavum and facet hypertrophy here. Minimal right hemi cord mass effect. Only borderline spinal stenosis. Mild to moderate right T8 foraminal stenosis. T9-T10: Larger right lateral recess region disc protrusion. Mild facet and ligament flavum hypertrophy. Borderline spinal stenosis. No cord mass effect.  Moderate to severe bilateral T9 foraminal stenosis. T11-T12: Small left paracentral disc protrusion. Mild to moderate facet hypertrophy. No significant stenosis. T12-L1: Disc space loss with circumferential disc bulging and mild to moderate facet and ligament flavum hypertrophy. No spinal stenosis. Borderline to mild T12 foraminal stenosis. IMPRESSION: 1. Recent T8 and chronic T12 vertebral augmentation. T8 spinous process fracture with associated marrow edema, interspinous ligament edema. And residual T8 vertebral body edema. 2. Adjacent T7 vertebral body edema and enhancement is indeterminate for multiple myeloma with mild pathologic inferior endplate fracture, versus osteoporotic/reactive fracture secondary to T8 augmentation. No complicating features. 3. Discrete multiple myeloma lesions suspected at both the T4 and T6 vertebral bodies, but no pathologic fracture. 4. No thoracic spinal cord signal abnormality. Fairly age-appropriate thoracic spine degeneration with only borderline degenerative spinal stenosis at T6-T7 to T9-T10. Occasional pronounced degenerative degenerative neural foraminal stenosis, including at the bilateral T9 nerve levels. Electronically Signed   By: Genevie Ann M.D.   On: 10/10/2022 04:36   VAS [place] LOWER EXTREMITY VENOUS (DVT)  Result Date: 09/30/2022  Lower Venous DVT Study Patient Name:  NEFTALY INZUNZA  Date of Exam:   09/30/2022 Medical Rec #: [id number]            Accession #:    [id number] Date of Birth: 09-Nov-1933             Patient Gender: F Patient Age:   89 years Exam Location:  [facility] Procedure:      VAS [place] LOWER EXTREMITY VENOUS (DVT) Referring Phys: Thelma Comp NANAVATI --------------------------------------------------------------------------------  Indications: Pain and swelling bilaterally.  Risk Factors: Cancer -multiple myeloma, on chemo History of CHF. Comparison Study: No prior studies. Performing Technologist: Darlin Coco RDMS, RVT  Examination Guidelines: A  complete evaluation includes B-mode imaging, spectral Doppler, color Doppler, and power Doppler as needed of all accessible portions of each vessel. Bilateral testing is considered an integral part of a complete examination. Limited examinations for reoccurring indications may be performed as noted. The reflux portion of the exam is performed with the patient in reverse Trendelenburg.  +---------+---------------+---------+-----------+----------+--------------+ RIGHT    CompressibilityPhasicitySpontaneityPropertiesThrombus Aging +---------+---------------+---------+-----------+----------+--------------+ CFV      Full           Yes      Yes                                 +---------+---------------+---------+-----------+----------+--------------+ SFJ      Full                                                        +---------+---------------+---------+-----------+----------+--------------+  FV Prox  Full                                                        +---------+---------------+---------+-----------+----------+--------------+ FV Mid   Full                                                        +---------+---------------+---------+-----------+----------+--------------+ FV DistalFull                                                        +---------+---------------+---------+-----------+----------+--------------+ PFV      Full                                                        +---------+---------------+---------+-----------+----------+--------------+ POP      Full           Yes      Yes                                 +---------+---------------+---------+-----------+----------+--------------+ PTV      Full                                                        +---------+---------------+---------+-----------+----------+--------------+ PERO     Full                                                         +---------+---------------+---------+-----------+----------+--------------+   +---------+---------------+---------+-----------+----------+--------------+ LEFT     CompressibilityPhasicitySpontaneityPropertiesThrombus Aging +---------+---------------+---------+-----------+----------+--------------+ CFV      Partial        Yes      Yes                                 +---------+---------------+---------+-----------+----------+--------------+ SFJ      Full                                                        +---------+---------------+---------+-----------+----------+--------------+ FV Prox  Full                                                        +---------+---------------+---------+-----------+----------+--------------+  FV Mid   Full                                                        +---------+---------------+---------+-----------+----------+--------------+ FV DistalFull                                                        +---------+---------------+---------+-----------+----------+--------------+ PFV      Full                                                        +---------+---------------+---------+-----------+----------+--------------+ POP      Full           Yes      Yes                                 +---------+---------------+---------+-----------+----------+--------------+ PTV      Full                                                        +---------+---------------+---------+-----------+----------+--------------+ PERO     Full                                                        +---------+---------------+---------+-----------+----------+--------------+     Summary: RIGHT: - There is no evidence of deep vein thrombosis in the lower extremity.  - No cystic structure found in the popliteal fossa.  LEFT: - There is no evidence of deep vein thrombosis in the lower extremity.  - No cystic structure found in the popliteal fossa.   *See table(s) above for measurements and observations. Electronically signed by Servando Snare MD on 09/30/2022 at 5:39:35 PM.    Final    DG Pelvis 1-2 Views  Result Date: 09/30/2022 CLINICAL DATA:  Multiple myeloma, pain EXAM: PELVIS - 1 VIEW; RIGHT FEMUR 4 VIEWS; LEFT FEMUR 4 VIEWS COMPARISON:  None Available. FINDINGS: There is no evidence of pelvic fracture or diastasis. No pelvic bone lesions are seen. Lumbosacral spondylitic changes noted. There is bilateral SI joint degenerative changes. Bilateral hip osteoarthritis with osteophytes and joint space narrowing. Images of the bilateral femora demonstrate no traumatic abnormalities. Patient is status post bilateral total knee arthroplasty. IMPRESSION: 1. 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 2. No acute osseous abnormalities identified. 3. Status post bilateral total knee arthroplasties. 4. No osteolytic lesions identified to correspond to patient's history of myeloma. Electronically Signed   By: Sammie Bench M.D.   On: 09/30/2022 09:07   DG Femur Min 2 Views Left  Result Date: 09/30/2022 CLINICAL  DATA:  Multiple myeloma, pain EXAM: PELVIS - 1 VIEW; RIGHT FEMUR 4 VIEWS; LEFT FEMUR 4 VIEWS COMPARISON:  None Available. FINDINGS: There is no evidence of pelvic fracture or diastasis. No pelvic bone lesions are seen. Lumbosacral spondylitic changes noted. There is bilateral SI joint degenerative changes. Bilateral hip osteoarthritis with osteophytes and joint space narrowing. Images of the bilateral femora demonstrate no traumatic abnormalities. Patient is status post bilateral total knee arthroplasty. IMPRESSION: 1. 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 2. No acute osseous abnormalities identified. 3. Status post bilateral total knee arthroplasties. 4. No osteolytic lesions  identified to correspond to patient's history of myeloma. Electronically Signed   By: Sammie Bench M.D.   On: 09/30/2022 09:07   DG Femur Min 2 Views Right  Result Date: 09/30/2022 CLINICAL DATA:  Multiple myeloma, pain EXAM: PELVIS - 1 VIEW; RIGHT FEMUR 4 VIEWS; LEFT FEMUR 4 VIEWS COMPARISON:  None Available. FINDINGS: There is no evidence of pelvic fracture or diastasis. No pelvic bone lesions are seen. Lumbosacral spondylitic changes noted. There is bilateral SI joint degenerative changes. Bilateral hip osteoarthritis with osteophytes and joint space narrowing. Images of the bilateral femora demonstrate no traumatic abnormalities. Patient is status post bilateral total knee arthroplasty. IMPRESSION: 1. 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 2. No acute osseous abnormalities identified. 3. Status post bilateral total knee arthroplasties. 4. No osteolytic lesions identified to correspond to patient's history of myeloma. Electronically Signed   By: Sammie Bench M.D.   On: 09/30/2022 09:07    ASSESSMENT & PLAN [name] 89 y.o. female with medical history significant for multiple myeloma who presents for a follow up visit.   After review of the labs, review the records, discussion with the patient the findings are most consistent with an IgG lambda multiple myeloma.  The patient meets the diagnostic criteria based on 60% plasma cells in the bone marrow and anemia.  Additionally the patient was noted to have vitamin B12 deficiency which is currently being treated as well.  Given her degree of anemia would recommend proceeding with VD chemotherapy and will add Revlimid once hemoglobin improves.  The patient and her daughter voiced understanding of the plan moving forward.  # IgG Lambda Multiple Myeloma  --diagnosis of MM confirmed with bone marrow biopsy showing 60% plasma cells and anemia -- Bone  survey shows no lytic lesions, kidney function is within normal limits.  --08/01/2022 was Cycle 1 Day 1 of Vd  Plan:  --Today is Cycle 4 Day 15 of Vd chemotherapy.  --Labs show white blood cell count 11.9, hemoglobin 12.2, MCV 84.6, and platelets of 166 creatinine is 0.45 and LFTs are within normal limits. --at each visit will collect CBC, CMP, and LDH with monthly restaging labs SPEP and SFLC.  --RTC in 2 weeks with interval weekly treatment.   # Normocytic Anemia #Vitamin B12 Deficiency --Anemia likely driven by multiple myeloma, but may be component of vitamin B12 deficiency as well. --initial labs showed elevated MMA with B12 180 -- Continue vitamin B12 1000 mcg p.o. daily -- Continue to monitor  #Supportive Care -- chemotherapy education complete -- port placement not required.  --We will discuss starting Zometa/Xgeva at next clinic visit. -- zofran 41m q8H PRN and compazine 15mPO q6H for nausea -- acyclovir 40060mO BID for VCZ prophylaxis -- tylenol 1000 mg q8H PRN for back pain.  No orders of the defined types were placed in this encounter.   All questions were answered. The patient knows to call the clinic with any problems, questions or concerns.  A total of more than 30 minutes were spent on this encounter with face-to-face time and non-face-to-face time, including preparing to see the patient, ordering tests and/or medications, counseling the patient and coordination of care as outlined above.   Ledell Peoples, MD Department of Hematology/Oncology [place] at [facility] Phone: [phone] Pager: [phone] Email: [email or web address]  10/24/2022 1:47 PM

## 2022-10-24 NOTE — Progress Notes (Signed)
Patient states she took her dexamethasone 20 mg dose by mouth this morning around 0830 prior to her visit.

## 2022-10-25 ENCOUNTER — Encounter: Payer: Self-pay | Admitting: General Practice

## 2022-10-25 NOTE — Progress Notes (Signed)
[facility] Spiritual Care Note  Reached Ginny by phone to introduce Spiritual Care, Blood Cancer Support Group, and Sales promotion account executive Care[facility] support programming. She reports good support from her four children, two of whom are local, and joy at her 10 grandchildren. Her husband of 31 years died six months ago, which is of course an ongoing source of grief; faith helps her cope and make meaning.   Donia Guiles was delighted for connection and welcomes a follow-up visit in infusion on 1/9. She also plans to attend Blood Cancer group as able. Adding her to group's email list with her permission, as well.  [place], [name], [facility] Pager [phone] Voicemail [phone]

## 2022-10-26 ENCOUNTER — Encounter: Payer: Self-pay | Admitting: Hematology and Oncology

## 2022-10-26 NOTE — Progress Notes (Signed)
Received call from Patient Accounting with patient on hold stating she has been having a hard time reaching me. Advised her to patch patient through.  Introduced myself and asked how I may help her. Patient states she was given my name but not the right number and was unable to reach me. She had questions regarding a $12,000 grant she was awarded and wanted to know how it would be applied to her billing.  Reviewed notes and saw note from [place] in pharmacy whom applied for [employer] for patient's oral medication to keep cost a $0 for patient. Provided patient this information and what specific medication it covers. Asked patient if she received an approval letter from foundation back in October with details on it. She states she doesn't remember receiving one.   Asked patient if she had received medication and she states she had from [employer] and confirmed she was not charged for it. Patient states on the slip it has her copay was $0.   Provided patient with Claire's direct name and number to contact with questions regarding grant and possibly provide her a copy of the letter. She was very Patent attorney.

## 2022-10-27 ENCOUNTER — Telehealth: Payer: Self-pay | Admitting: Hematology and Oncology

## 2022-10-27 ENCOUNTER — Other Ambulatory Visit: Payer: Self-pay | Admitting: *Deleted

## 2022-10-27 MED ORDER — DEXAMETHASONE 4 MG PO TABS: 20.0000 mg | ORAL_TABLET | ORAL | 5 refills | Status: DC

## 2022-10-27 NOTE — Telephone Encounter (Signed)
Called patient to notify of new appointments. Patient notified.  

## 2022-10-31 ENCOUNTER — Other Ambulatory Visit: Payer: Self-pay

## 2022-10-31 ENCOUNTER — Ambulatory Visit: Payer: Medicare Other

## 2022-10-31 ENCOUNTER — Inpatient Hospital Stay: Payer: Medicare Other

## 2022-10-31 ENCOUNTER — Other Ambulatory Visit: Payer: Medicare Other

## 2022-10-31 ENCOUNTER — Encounter: Payer: Self-pay | Admitting: General Practice

## 2022-10-31 ENCOUNTER — Inpatient Hospital Stay: Payer: Medicare Other | Admitting: Hematology and Oncology

## 2022-10-31 VITALS — BP 140/58 | HR 80 | Temp 97.5°F | Resp 16 | Wt 160.1 lb

## 2022-10-31 DIAGNOSIS — Z5112 Encounter for antineoplastic immunotherapy: Secondary | ICD-10-CM | POA: Diagnosis not present

## 2022-10-31 DIAGNOSIS — C9000 Multiple myeloma not having achieved remission: Secondary | ICD-10-CM

## 2022-10-31 DIAGNOSIS — E538 Deficiency of other specified B group vitamins: Secondary | ICD-10-CM | POA: Diagnosis not present

## 2022-10-31 DIAGNOSIS — D649 Anemia, unspecified: Secondary | ICD-10-CM | POA: Diagnosis not present

## 2022-10-31 DIAGNOSIS — R0602 Shortness of breath: Secondary | ICD-10-CM | POA: Diagnosis not present

## 2022-10-31 LAB — CBC WITH DIFFERENTIAL (CANCER CENTER ONLY)
Abs Immature Granulocytes: 0.06 10*3/uL (ref 0.00–0.07)
Basophils Absolute: 0.1 10*3/uL (ref 0.0–0.1)
Basophils Relative: 1 %
Eosinophils Absolute: 0.4 10*3/uL (ref 0.0–0.5)
Eosinophils Relative: 3 %
HCT: 37 % (ref 36.0–46.0)
Hemoglobin: 12.4 g/dL (ref 12.0–15.0)
Immature Granulocytes: 1 %
Lymphocytes Relative: 8 %
Lymphs Abs: 0.9 10*3/uL (ref 0.7–4.0)
MCH: 28.7 pg (ref 26.0–34.0)
MCHC: 33.5 g/dL (ref 30.0–36.0)
MCV: 85.6 fL (ref 80.0–100.0)
Monocytes Absolute: 1.7 10*3/uL — ABNORMAL HIGH (ref 0.1–1.0)
Monocytes Relative: 15 %
Neutro Abs: 8.4 10*3/uL — ABNORMAL HIGH (ref 1.7–7.7)
Neutrophils Relative %: 72 %
Platelet Count: 276 10*3/uL (ref 150–400)
RBC: 4.32 MIL/uL (ref 3.87–5.11)
RDW: 18.1 % — ABNORMAL HIGH (ref 11.5–15.5)
WBC Count: 11.4 10*3/uL — ABNORMAL HIGH (ref 4.0–10.5)
nRBC: 0 % (ref 0.0–0.2)

## 2022-10-31 LAB — CMP (CANCER CENTER ONLY)
ALT: 64 U/L — ABNORMAL HIGH (ref 0–44)
AST: 71 U/L — ABNORMAL HIGH (ref 15–41)
Albumin: 3.4 g/dL — ABNORMAL LOW (ref 3.5–5.0)
Alkaline Phosphatase: 78 U/L (ref 38–126)
Anion gap: 8 (ref 5–15)
BUN: 18 mg/dL (ref 8–23)
CO2: 25 mmol/L (ref 22–32)
Calcium: 8.5 mg/dL — ABNORMAL LOW (ref 8.9–10.3)
Chloride: 98 mmol/L (ref 98–111)
Creatinine: 0.61 mg/dL (ref 0.44–1.00)
GFR, Estimated: >60 mL/min (ref >60)
Glucose, Bld: 115 mg/dL — ABNORMAL HIGH (ref 70–99)
Potassium: 3.9 mmol/L (ref 3.5–5.1)
Sodium: 131 mmol/L — ABNORMAL LOW (ref 135–145)
Total Bilirubin: 0.6 mg/dL (ref 0.3–1.2)
Total Protein: 6.6 g/dL (ref 6.5–8.1)

## 2022-10-31 MED ORDER — BORTEZOMIB CHEMO SQ INJECTION 3.5 MG (2.5MG/ML)
1.3000 mg/m2 | Freq: Once | INTRAMUSCULAR | Status: AC
  Administered 2022-10-31: 2.25 mg via SUBCUTANEOUS
  Filled 2022-10-31: qty 0.9

## 2022-10-31 NOTE — Progress Notes (Signed)
Patient took dexamethasone at home at 8:15 this morning.

## 2022-10-31 NOTE — Progress Notes (Signed)
[place] [phone]   [phone]  PROGRESS NOTE  Patient Care Team: [facility] as PCP - General (Rheumatology)  Hematological/Oncological History # IgG Lambda Multiple Myeloma  06/14/2022: establish care with [name] due to anemia. Labs showed M protein 4.9, Kappa 7.2, Lambda 10.8, ratio 0.67 07/13/2022: Bmbx showed Lambda restricted plasma cell neoplasm involving approximately 60% of the cellular marrow by IHC on the biopsy.  08/01/2022: Cycle 1 Day 1 of VRd chemotherapy. (Holding revlimid initially) 08/21/2022:  Cycle 2 Day 1 of VRd chemotherapy. (Holding revlimid) 09/04/2022: Cycle 3 Day 1 of VRd chemotherapy. Started revlimid 10/03/2022: Cycle 4 Day 1 of VRd chemotherapy 10/31/2022: Cycle 5 Day 1 of VRd chemotherapy  Interval History:  Zoe [name] 89 y.o. female with medical history significant for newly diagnosed multiple myeloma who presents for a follow up visit. The patient's last visit was on 10/24/2022. In the interim since the last visit she has continued VRd chemotherapy.   On exam today [name] reports she is continuing to struggle with back pain.  She reports it only bothers her at night.  She slept for about 1030 to midnight but then unfortunately was woken up by the pain.  She notes that she is taking the Lyrica 2 doses at night and 1 doses in the morning and unfortunately it is not helping with the night pain.  She is not currently taking her hydrocodone.  She notes she will be reaching out to her orthopedist Dr. Rolena Infante to see if this is something he can assist with.  She notes that her pain is fine right now she is sitting here and has not a problem during the day.  She has been eating well and she is not having any nausea or vomiting.  She notes that she does tend to snack at night when she is awake.  Overall she is tolerating her treatment well and is not having any trouble with fevers, chills, sweats,  nausea, vomiting or diarrhea.  She is willing and able to proceed with treatment today. Full 10 point ROS was otherwise negative.  MEDICAL HISTORY:  Past Medical History:  Diagnosis Date   Arthritis    some - per patient   Breast cancer ([place])    breast cancer / left    Cataract    bilat    GERD (gastroesophageal reflux disease)    History of kidney stones    Hyperlipidemia    Hypertension    Hypothyroidism    Macular degeneration    Left   S/P TAVR (transcatheter aortic valve replacement) 09/03/2018   23 mm Edwards Sapien 3 transcatheter heart valve placed via percutaneous right transfemoral approach    Severe aortic stenosis    Stress incontinence    Thyroid disease    Tinnitus     SURGICAL HISTORY: Past Surgical History:  Procedure Laterality Date   ABDOMINAL HYSTERECTOMY  1970's   BACK SURGERY     BREAST LUMPECTOMY  12/1998   lumpectomy   CARDIAC CATHETERIZATION     EYE SURGERY     cataract surgery bilat    INTRAOPERATIVE TRANSTHORACIC ECHOCARDIOGRAM N/A 09/03/2018   Procedure: INTRAOPERATIVE TRANSTHORACIC ECHOCARDIOGRAM;  Surgeon: Burnell Blanks, MD;  Location: [facility];  Service: Open Heart Surgery;  Laterality: N/A;   KYPHOPLASTY N/A 09/07/2022   Procedure: THORACIC EIGHT KYPHOPLASTY;  Surgeon: Melina Schools, MD;  Location: [facility];  Service: Orthopedics;  Laterality: N/A;  1 hr Local with [facility] 3  C-Bed   LITHOTRIPSY     Right total knee     2018 Dr. Alvan Dame   RIGHT/LEFT HEART CATH AND CORONARY ANGIOGRAPHY N/A 08/06/2018   Procedure: RIGHT/LEFT HEART CATH AND CORONARY ANGIOGRAPHY;  Surgeon: Adrian Prows, MD;  Location: [facility];  Service: Cardiovascular;  Laterality: N/A;   THYROIDECTOMY, PARTIAL  1975   TONSILLECTOMY     as a child - patient not sure of exact date   TOTAL KNEE ARTHROPLASTY Left 03/13/2016   Procedure: TOTAL KNEE ARTHROPLASTY;  Surgeon: Paralee Cancel, MD;  Location: [facility];  Service: Orthopedics;  Laterality: Left;   TOTAL KNEE  ARTHROPLASTY Right 06/18/2017   Procedure: RIGHT TOTAL KNEE ARTHROPLASTY;  Surgeon: Paralee Cancel, MD;  Location: [facility];  Service: Orthopedics;  Laterality: Right;   TRANSCATHETER AORTIC VALVE REPLACEMENT, TRANSFEMORAL N/A 09/03/2018   Procedure: TRANSCATHETER AORTIC VALVE REPLACEMENT, TRANSFEMORAL;  Surgeon: Burnell Blanks, MD;  Location: [facility];  Service: Open Heart Surgery;  Laterality: N/A;    SOCIAL HISTORY: Social History   Socioeconomic History   Marital status: Widowed    Spouse name: Not on file   Number of children: 4   Years of education: Not on file   Highest education level: Master's degree (e.g., MA, MS, MEng, MEd, MSW, MBA)  Occupational History   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  Tobacco Use   Smoking status: Never   Smokeless tobacco: Never  Vaping Use   Vaping Use: Never used  Substance and Sexual Activity   Alcohol use: No   Drug use: No   Sexual activity: Not Currently  Other Topics Concern   Not on file  Social History Narrative   Not on file   Social Determinants of Health   Financial Resource Strain: Low Risk  (11/12/2018)   Overall Financial Resource Strain (CARDIA)    Difficulty of Paying Living Expenses: Not very hard  Food Insecurity: No Food Insecurity (11/12/2018)   Hunger Vital Sign    Worried About Running Out of Food in the Last Year: Never true    Ran Out of Food in the Last Year: Never true  Transportation Needs: No Transportation Needs (11/12/2018)   PRAPARE - Hydrologist (Medical): No    Lack of Transportation (Non-Medical): No  Physical Activity: Inactive (11/12/2018)   Exercise Vital Sign    Days of Exercise per Week: 0 days    Minutes of Exercise per Session: 0 min  Stress: Stress Concern Present (11/12/2018)   [place]    Feeling of Stress : To some extent  Social Connections: Not on file   Intimate Partner Violence: Not on file    FAMILY HISTORY: Family History  Problem Relation Age of Onset   Diabetes Mother    Stroke Mother        Carotid artery disease   Heart disease Father        CAD   Coronary artery disease Father    Diabetes Sister     ALLERGIES:  is allergic to penicillins and sulfa antibiotics.  MEDICATIONS:  Current Outpatient Medications  Medication Sig Dispense Refill   acyclovir (ZOVIRAX) 400 MG tablet Take 400 mg by mouth 2 (two) times daily. Pt takes 1 tablet (400 mg total) by mouth 2 (two) times daily.     Artificial Tear Solution (SOOTHE XP) SOLN Place 1 drop into both eyes every evening.     aspirin  EC 81 MG tablet Take 81 mg by mouth at bedtime. Swallow whole.     calcitonin, salmon, (MIACALCIN/FORTICAL) 200 UNIT/ACT nasal spray Place 1 spray into alternate nostrils daily.     carvedilol (COREG) 12.5 MG tablet Take 12.5 mg by mouth 2 (two) times daily with a meal.     Cholecalciferol (VITAMIN D3) 50 MCG (2000 UT) capsule Take 1 capsule (2,000 Units total) by mouth daily.     cyanocobalamin (VITAMIN B12) 1000 MCG tablet Take 1,000 mcg by mouth daily.     dapagliflozin propanediol (FARXIGA) 10 MG TABS tablet TAKE 1 TABLET BY MOUTH EVERY DAY 90 tablet 2   dexamethasone (DECADRON) 4 MG tablet Take 5 tablets (20 mg total) by mouth once a week. Take 20 mg (5 tablets) on day of multiple myeloma treatment 20 tablet 5   esomeprazole (NEXIUM) 20 MG capsule Take 20 mg by mouth daily as needed (Heartburn).     ferrous sulfate 325 (65 FE) MG tablet Take 325 mg by mouth daily with breakfast.     furosemide (LASIX) 20 MG tablet PLEASE SEE ATTACHED FOR DETAILED DIRECTIONS (Patient taking differently: Take 20 mg by mouth daily.) 90 tablet 1   HYDROcodone-acetaminophen (NORCO/VICODIN) 5-325 MG tablet Take 1 tablet by mouth 3 (three) times daily.     lenalidomide (REVLIMID) 25 MG capsule Take 1 capsule (25 mg total) by mouth daily. Take for 14 days, then none for 7  days. Repeat every 21 days. [employer] Auth # [id number] Date Obtained 09/29/22 14 capsule 0   losartan (COZAAR) 25 MG tablet Take 1 tablet (25 mg total) by mouth daily. 90 tablet 1   ondansetron (ZOFRAN) 8 MG tablet Take 1 tablet (8 mg total) by mouth every 8 (eight) hours as needed. (Patient taking differently: Take 8 mg by mouth every 8 (eight) hours as needed for nausea or vomiting.) 30 tablet 0   polyethylene glycol (MIRALAX / GLYCOLAX) 17 g packet Take 17 g by mouth daily as needed for mild constipation.     pravastatin (PRAVACHOL) 40 MG tablet Take 40 mg by mouth every evening.     pregabalin (LYRICA) 25 MG capsule Take 1 capsule (25 mg total) by mouth 2 (two) times daily. 60 capsule 2   prochlorperazine (COMPAZINE) 10 MG tablet Take 1 tablet (10 mg total) by mouth every 6 (six) hours as needed for nausea or vomiting. 30 tablet 0   KLOR-CON M10 10 MEQ tablet TAKE 2 TABLETS BY MOUTH EVERY DAY AS NEEDED WITH FUROSEMIDE (Patient not taking: Reported on 10/31/2022) 180 tablet 0   levothyroxine (SYNTHROID, LEVOTHROID) 100 MCG tablet Take 100 mcg by mouth daily before breakfast.  2   No current facility-administered medications for this visit.    REVIEW OF SYSTEMS:   Constitutional: ( - ) fevers, ( - )  chills , ( - ) night sweats Eyes: ( - ) blurriness of vision, ( - ) double vision, ( - ) watery eyes Ears, nose, mouth, throat, and face: ( - ) mucositis, ( - ) sore throat Respiratory: ( - ) cough, ( - ) dyspnea, ( - ) wheezes Cardiovascular: ( - ) palpitation, ( - ) chest discomfort, ( - ) lower extremity swelling Gastrointestinal:  ( - ) nausea, ( - ) heartburn, ( - ) change in bowel habits Skin: ( - ) abnormal skin rashes Lymphatics: ( - ) new lymphadenopathy, ( - ) easy bruising Neurological: ( - ) numbness, ( - ) tingling, ( - ) new weaknesses  Behavioral/Psych: ( - ) mood change, ( - ) new changes  All other systems were reviewed with the patient and are negative.  PHYSICAL EXAMINATION: ECOG  PERFORMANCE STATUS: 1 - Symptomatic but completely ambulatory  Vitals:   10/31/22 0945  BP: (!) 140/58  Pulse: 80  Resp: 16  Temp: (!) 97.5 F (36.4 C)  SpO2: 94%     Filed Weights   10/31/22 0945  Weight: 160 lb 1.6 oz (72.6 kg)      GENERAL: well appearing elderly Caucasian female, alert, no distress and comfortable SKIN: skin color, texture, turgor are normal, no rashes or significant lesions EYES: conjunctiva are pink and non-injected, sclera clear LUNGS: clear to auscultation and percussion with normal breathing effort HEART: regular rate & rhythm and no murmurs and no lower extremity edema Musculoskeletal: no cyanosis of digits and no clubbing  PSYCH: alert & oriented x 3, fluent speech NEURO: no focal motor/sensory deficits  LABORATORY DATA:  I have reviewed the data as listed    Latest Ref Rng & Units 10/31/2022    9:15 AM 10/24/2022   10:28 AM 10/17/2022    8:41 AM  CBC  WBC 4.0 - 10.5 K/uL 11.4  11.9  8.2   Hemoglobin 12.0 - 15.0 g/dL 12.4  12.2  12.3   Hematocrit 36.0 - 46.0 % 37.0  35.8  36.2   Platelets 150 - 400 K/uL 276  166  249        Latest Ref Rng & Units 10/31/2022    9:15 AM 10/24/2022   10:28 AM 10/17/2022    8:41 AM  CMP  Glucose 70 - 99 mg/dL 115  130  113   BUN 8 - 23 mg/dL '18  14  14   '$ Creatinine 0.44 - 1.00 mg/dL 0.61  0.45  0.59   Sodium 135 - 145 mmol/L 131  127  130   Potassium 3.5 - 5.1 mmol/L 3.9  3.4  3.5   Chloride 98 - 111 mmol/L 98  91  96   CO2 22 - 32 mmol/L '25  29  27   '$ Calcium 8.9 - 10.3 mg/dL 8.5  8.4  8.5   Total Protein 6.5 - 8.1 g/dL 6.6  6.6  6.7   Total Bilirubin 0.3 - 1.2 mg/dL 0.6  0.5  0.7   Alkaline Phos 38 - 126 U/L 78  73  98   AST 15 - 41 U/L 71  21  15   ALT 0 - 44 U/L 64  17  11     Lab Results  Component Value Date   MPROTEIN 2.2 (H) 09/25/2022   MPROTEIN 3.3 (H) 08/28/2022   MPROTEIN 4.9 (H) 08/01/2022   Lab Results  Component Value Date   KPAFRELGTCHN 8.7 09/25/2022   KPAFRELGTCHN 4.3 08/28/2022    KPAFRELGTCHN 6.3 08/01/2022   LAMBDASER 6.1 09/25/2022   LAMBDASER 6.2 08/28/2022   LAMBDASER 9.7 08/01/2022   KAPLAMBRATIO 1.43 09/25/2022   KAPLAMBRATIO 0.69 08/28/2022   KAPLAMBRATIO 0.65 08/01/2022    RADIOGRAPHIC STUDIES: MR Lumbar Spine W Wo Contrast  Result Date: 10/10/2022 CLINICAL DATA:  89 year old female with multiple myeloma. Pain. Prior T12 kyphoplasty. EXAM: MRI LUMBAR SPINE WITHOUT AND WITH CONTRAST TECHNIQUE: Multiplanar and multiecho pulse sequences of the lumbar spine were obtained without and with intravenous contrast. CONTRAST:  43m GADAVIST GADOBUTROL 1 MMOL/ML IV SOLN COMPARISON:  Thoracic MRI from the same day reported separately. Lumbar MRI 06/04/2019. Lumbar radiographs 08/21/2022. recent abdomen MRI 09/23/2022. FINDINGS: Segmentation:  Normal, concordant with the thoracic spine numbering today. Alignment: Stable since 2020. Lumbar lordosis with grade 1 anterolisthesis of L4 on L5 measuring 4-5 mm. Subtle lumbar scoliosis. Vertebrae: Thoracic levels through T12 are described separately today. L1 vertebral body heterogeneity appears mildly regressed since 2020. Mild compression there is stable. Similarly, increased homogeneous marrow signal in the L2 vertebral body. And L3 through L5 marrow signal is within normal limits. Visible sacrum and pelvis marrow signal mildly heterogeneous, fairly normal for age. No marrow edema or evidence of acute osseous abnormality. Conus medullaris and cauda equina: Conus extends to the T12-L1 level. No lower spinal cord or conus signal abnormality. No abnormal intradural enhancement. No dural thickening. Normal cauda equina nerve roots. Paraspinal and other soft tissues: Hepatic hemangioma as demonstrated on abdomen MRI earlier this month. Other visible abdominal viscera appear stable and normal for age. Lumbar paraspinal soft tissues are within normal limits. Disc levels: Chronic lumbar spine degeneration not significantly changed from 2020,  with capacious underlying spinal canal, and no significant lumbar spinal stenosis. IMPRESSION: 1. No acute or metastatic process identified in the lumbar spine. Decreased heterogeneity of the L1 and L2 vertebral bodies since 2020 might reflect regression of multiple myeloma there. 2. Lumbar spine degeneration not progressed since 2020, and superimposed on capacious spinal canal with no significant lumbar spinal stenosis. Electronically Signed   By: Genevie Ann M.D.   On: 10/10/2022 05:34   MR THORACIC SPINE W WO CONTRAST  Result Date: 10/10/2022 CLINICAL DATA:  89 year old female with multiple myeloma. Pain. Previous thoracic kyphoplasties, most recently 09/07/2022 at the T8 level. History of liver lesion evaluated on recent abdomen MRI. EXAM: MRI THORACIC WITHOUT AND WITH CONTRAST TECHNIQUE: Multiplanar and multiecho pulse sequences of the thoracic spine were obtained without and with intravenous contrast. CONTRAST:  59m GADAVIST GADOBUTROL 1 MMOL/ML IV SOLN COMPARISON:  Thoracic spine MRI 03/20/2017. Thoracic spine series 08/21/2022. Abdomen MRI 09/23/2022. FINDINGS: Limited cervical spine imaging: Age-appropriate, mild for age cervical spine degeneration. Cervical vertebral marrow signal seems to remain normal, no obvious cervical plasmacytoma. Thoracic spine segmentation:  Normal. Alignment: Thoracic kyphosis is stable since 2018. No significant scoliosis or spondylolisthesis. Vertebrae: Sequelae of T8 and T12 vertebral augmentation. Loss of height at both levels. Some T8 vertebral marrow edema persists. And there is deformity and enhancement of the T8 spinous process, interspinous ligament with T7 which most resembles a fracture (series 24, image 10). Adjacent T7 vertebral body confluent marrow edema and enhancement (series 24, image 13) suspicious for multiple myeloma infiltration in this setting, however, the subjacent right T7 inferior endplate also appears compressed on series 17, image 9. Posterior  element marrow signal at that level is normal. However, there is a similar 12 mm T4 vertebral body lesion decreased T1 marrow signal, patchy enhancement and edema compatible with multiple myeloma. No loss of height there. Smaller round roughly 8 mm T6 vertebral body ring-enhancing lesion. Other background thoracic vertebral marrow heterogeneity, but no other discrete vertebral mass or enhancement. Cord: Thoracic spinal cord remains normal despite degenerative changes. At many levels the thoracic thecal sac is capacious. conus medullaris is normal at T12-L1. No abnormal intradural enhancement. No dural thickening identified. Paraspinal and other soft tissues: Partially visible hepatic hemangioma on series 20, image 37 as detailed on abdomen MRI this month. Other appear visible thoracic and upper abdominal viscera negative. Thoracic paraspinal soft tissues appear negative aside from some soft tissue edema at T8. Disc levels: Notable levels of thoracic spine degeneration: T1-T2: Mild disc bulging.  Moderate facet hypertrophy. No spinal stenosis. At least moderate bilateral T1 neural foraminal stenosis. T4-T5: Small right paracentral disc or or disc osteophyte complex. No stenosis. T6-T7: Right paracentral disc or disc osteophyte complex. Mild mass effect on the right hemi cord here (series 20, image 22) but only borderline spinal stenosis. No foraminal stenosis. T8-T9 similar paracentral disc or disc osteophyte complex. Mild ligament flavum and facet hypertrophy here. Minimal right hemi cord mass effect. Only borderline spinal stenosis. Mild to moderate right T8 foraminal stenosis. T9-T10: Larger right lateral recess region disc protrusion. Mild facet and ligament flavum hypertrophy. Borderline spinal stenosis. No cord mass effect. Moderate to severe bilateral T9 foraminal stenosis. T11-T12: Small left paracentral disc protrusion. Mild to moderate facet hypertrophy. No significant stenosis. T12-L1: Disc space loss with  circumferential disc bulging and mild to moderate facet and ligament flavum hypertrophy. No spinal stenosis. Borderline to mild T12 foraminal stenosis. IMPRESSION: 1. Recent T8 and chronic T12 vertebral augmentation. T8 spinous process fracture with associated marrow edema, interspinous ligament edema. And residual T8 vertebral body edema. 2. Adjacent T7 vertebral body edema and enhancement is indeterminate for multiple myeloma with mild pathologic inferior endplate fracture, versus osteoporotic/reactive fracture secondary to T8 augmentation. No complicating features. 3. Discrete multiple myeloma lesions suspected at both the T4 and T6 vertebral bodies, but no pathologic fracture. 4. No thoracic spinal cord signal abnormality. Fairly age-appropriate thoracic spine degeneration with only borderline degenerative spinal stenosis at T6-T7 to T9-T10. Occasional pronounced degenerative degenerative neural foraminal stenosis, including at the bilateral T9 nerve levels. Electronically Signed   By: Genevie Ann M.D.   On: 10/10/2022 04:36    ASSESSMENT & PLAN [name] 89 y.o. female with medical history significant for multiple myeloma who presents for a follow up visit.   After review of the labs, review the records, discussion with the patient the findings are most consistent with an IgG lambda multiple myeloma.  The patient meets the diagnostic criteria based on 60% plasma cells in the bone marrow and anemia.  Additionally the patient was noted to have vitamin B12 deficiency which is currently being treated as well.  Given her degree of anemia would recommend proceeding with VD chemotherapy and will add Revlimid once hemoglobin improves.  The patient and her daughter voiced understanding of the plan moving forward.  # IgG Lambda Multiple Myeloma  --diagnosis of MM confirmed with bone marrow biopsy showing 60% plasma cells and anemia -- Bone survey shows no lytic lesions, kidney function is within normal  limits.  --08/01/2022 was Cycle 1 Day 1 of Vd  Plan:  --Today is Cycle 5 Day 1 of Vd chemotherapy.  --Labs show white blood cell count 11.4, hemoglobin 12.4, MCV 85.6, and platelets of 276. --at each visit will collect CBC, CMP, and LDH with monthly restaging labs SPEP and SFLC.  --RTC in 2 weeks with interval weekly treatment.   # Normocytic Anemia #Vitamin B12 Deficiency --Anemia likely driven by multiple myeloma, but may be component of vitamin B12 deficiency as well. --initial labs showed elevated MMA with B12 180 -- Continue vitamin B12 1000 mcg p.o. daily -- Continue to monitor  #Supportive Care -- chemotherapy education complete -- port placement not required.  --We will discuss starting Zometa/Xgeva  -- zofran '8mg'$  q8H PRN and compazine '10mg'$  PO q6H for nausea -- acyclovir '400mg'$  PO BID for VCZ prophylaxis -- tylenol 1000 mg q8H PRN for back pain.   No orders of the defined types were placed in this encounter.  All questions were answered. The patient knows to call the clinic with any problems, questions or concerns.  A total of more than 30 minutes were spent on this encounter with face-to-face time and non-face-to-face time, including preparing to see the patient, ordering tests and/or medications, counseling the patient and coordination of care as outlined above.   Ledell Peoples, MD Department of Hematology/Oncology [place] [facility] Phone: [phone] Pager: [phone] Email: [email or web address]  10/31/2022 3:27 PM

## 2022-10-31 NOTE — Progress Notes (Signed)
[place] Spiritual Care Note  Followed up with Zoe [name] in infusion as planned to meet in person prior to Blood Cancer Support Group, which she hopes to attend virtually this afternoon. Also met her daughter-in-law Zoe [name].  Provided packet of [place] and [place] and adding Zoe [name] to hard-copy mailing list per her request.  Zoe [name] is familiar with the healing garden through her former neighbor, who was an avid Psychologist, occupational, and hopes to try out garden programming in the spring. She is interested in massage and chair yoga as well.  Plan to follow up via blood group, and Zoe [name] has direct Spiritual Care number for future needs.   [place], [name], [facility] Pager [phone] Voicemail [phone]

## 2022-10-31 NOTE — Patient Instructions (Signed)
[facility]  Discharge Instructions: Thank you for choosing [facility] to provide your oncology and hematology care.   If you have a lab appointment with the [facility], please go directly to the [facility] and check in at the registration area.   Wear comfortable clothing and clothing appropriate for easy access to any Portacath or PICC line.   We strive to give you quality time with your provider. You may need to reschedule your appointment if you arrive late (15 or more minutes).  Arriving late affects you and other patients whose appointments are after yours.  Also, if you miss three or more appointments without notifying the office, you may be dismissed from the clinic at the provider's discretion.      For prescription refill requests, have your pharmacy contact our office and allow 72 hours for refills to be completed.    Today you received the following chemotherapy and/or immunotherapy agents: Velcade      To help prevent nausea and vomiting after your treatment, we encourage you to take your nausea medication as directed.  BELOW ARE SYMPTOMS THAT SHOULD BE REPORTED IMMEDIATELY: *FEVER GREATER THAN 100.4 F (38 C) OR HIGHER *CHILLS OR SWEATING *NAUSEA AND VOMITING THAT IS NOT CONTROLLED WITH YOUR NAUSEA MEDICATION *UNUSUAL SHORTNESS OF BREATH *UNUSUAL BRUISING OR BLEEDING *URINARY PROBLEMS (pain or burning when urinating, or frequent urination) *BOWEL PROBLEMS (unusual diarrhea, constipation, pain near the anus) TENDERNESS IN MOUTH AND THROAT WITH OR WITHOUT PRESENCE OF ULCERS (sore throat, sores in mouth, or a toothache) UNUSUAL RASH, SWELLING OR PAIN  UNUSUAL VAGINAL DISCHARGE OR ITCHING   Items with * indicate a potential emergency and should be followed up as soon as possible or go to the Emergency Department if any problems should occur.  Please show the CHEMOTHERAPY ALERT CARD or IMMUNOTHERAPY ALERT CARD at check-in to  the Emergency Department and triage nurse.  Should you have questions after your visit or need to cancel or reschedule your appointment, please contact [facility]  Dept: [phone]  and follow the prompts.  Office hours are 8:00 a.m. to 4:30 p.m. Monday - Friday. Please note that voicemails left after 4:00 p.m. may not be returned until the following business day.  We are closed weekends and major holidays. You have access to a nurse at all times for urgent questions. Please call the main number to the clinic Dept: [phone] and follow the prompts.   For any non-urgent questions, you may also contact your provider using MyChart. We now offer e-Visits for anyone 31 and older to request care online for non-urgent symptoms. For details visit [email or web address].   Also download the MyChart app! Go to the app store, search "MyChart", open the app, select [facility], and log in with your MyChart username and password.

## 2022-11-01 ENCOUNTER — Encounter (INDEPENDENT_AMBULATORY_CARE_PROVIDER_SITE_OTHER): Payer: Medicare Other | Admitting: Ophthalmology

## 2022-11-01 ENCOUNTER — Ambulatory Visit: Payer: Medicare Other | Admitting: Podiatry

## 2022-11-01 ENCOUNTER — Encounter (INDEPENDENT_AMBULATORY_CARE_PROVIDER_SITE_OTHER): Payer: Self-pay | Admitting: Ophthalmology

## 2022-11-01 DIAGNOSIS — H353231 Exudative age-related macular degeneration, bilateral, with active choroidal neovascularization: Secondary | ICD-10-CM

## 2022-11-01 DIAGNOSIS — H43813 Vitreous degeneration, bilateral: Secondary | ICD-10-CM

## 2022-11-01 DIAGNOSIS — I10 Essential (primary) hypertension: Secondary | ICD-10-CM | POA: Diagnosis not present

## 2022-11-01 DIAGNOSIS — H35033 Hypertensive retinopathy, bilateral: Secondary | ICD-10-CM | POA: Diagnosis not present

## 2022-11-01 LAB — KAPPA/LAMBDA LIGHT CHAINS
Kappa free light chain: 11.9 mg/L (ref 3.3–19.4)
Kappa, lambda light chain ratio: 1.51 (ref 0.26–1.65)
Lambda free light chains: 7.9 mg/L (ref 5.7–26.3)

## 2022-11-03 LAB — MULTIPLE MYELOMA PANEL, SERUM
Albumin SerPl Elph-Mcnc: 3.2 g/dL (ref 2.9–4.4)
Albumin/Glob SerPl: 1.1 (ref 0.7–1.7)
Alpha 1: 0.2 g/dL (ref 0.0–0.4)
Alpha2 Glob SerPl Elph-Mcnc: 0.7 g/dL (ref 0.4–1.0)
B-Globulin SerPl Elph-Mcnc: 0.6 g/dL — ABNORMAL LOW (ref 0.7–1.3)
Gamma Glob SerPl Elph-Mcnc: 1.5 g/dL (ref 0.4–1.8)
Globulin, Total: 3 g/dL (ref 2.2–3.9)
IgA: 50 mg/dL — ABNORMAL LOW (ref 64–422)
IgG (Immunoglobin G), Serum: 1543 mg/dL (ref 586–1602)
IgM (Immunoglobulin M), Srm: 30 mg/dL (ref 26–217)
M Protein SerPl Elph-Mcnc: 1.3 g/dL — ABNORMAL HIGH
Total Protein ELP: 6.2 g/dL (ref 6.0–8.5)

## 2022-11-05 ENCOUNTER — Other Ambulatory Visit: Payer: Self-pay | Admitting: Cardiology

## 2022-11-05 DIAGNOSIS — I5033 Acute on chronic diastolic (congestive) heart failure: Secondary | ICD-10-CM

## 2022-11-06 ENCOUNTER — Inpatient Hospital Stay: Payer: Medicare Other

## 2022-11-06 ENCOUNTER — Other Ambulatory Visit: Payer: Self-pay

## 2022-11-06 VITALS — BP 147/66 | HR 69 | Temp 98.0°F | Resp 24

## 2022-11-06 DIAGNOSIS — C9000 Multiple myeloma not having achieved remission: Secondary | ICD-10-CM | POA: Diagnosis not present

## 2022-11-06 DIAGNOSIS — R0602 Shortness of breath: Secondary | ICD-10-CM | POA: Diagnosis not present

## 2022-11-06 DIAGNOSIS — D649 Anemia, unspecified: Secondary | ICD-10-CM | POA: Diagnosis not present

## 2022-11-06 DIAGNOSIS — Z5112 Encounter for antineoplastic immunotherapy: Secondary | ICD-10-CM | POA: Diagnosis not present

## 2022-11-06 DIAGNOSIS — E538 Deficiency of other specified B group vitamins: Secondary | ICD-10-CM | POA: Diagnosis not present

## 2022-11-06 LAB — CBC WITH DIFFERENTIAL (CANCER CENTER ONLY)
Abs Immature Granulocytes: 0.04 10*3/uL (ref 0.00–0.07)
Basophils Absolute: 0 10*3/uL (ref 0.0–0.1)
Basophils Relative: 0 %
Eosinophils Absolute: 0.3 10*3/uL (ref 0.0–0.5)
Eosinophils Relative: 4 %
HCT: 36.1 % (ref 36.0–46.0)
Hemoglobin: 12.5 g/dL (ref 12.0–15.0)
Immature Granulocytes: 1 %
Lymphocytes Relative: 7 %
Lymphs Abs: 0.6 10*3/uL — ABNORMAL LOW (ref 0.7–4.0)
MCH: 29.4 pg (ref 26.0–34.0)
MCHC: 34.6 g/dL (ref 30.0–36.0)
MCV: 84.9 fL (ref 80.0–100.0)
Monocytes Absolute: 0.5 10*3/uL (ref 0.1–1.0)
Monocytes Relative: 6 %
Neutro Abs: 7.2 10*3/uL (ref 1.7–7.7)
Neutrophils Relative %: 82 %
Platelet Count: 244 10*3/uL (ref 150–400)
RBC: 4.25 MIL/uL (ref 3.87–5.11)
RDW: 17.4 % — ABNORMAL HIGH (ref 11.5–15.5)
WBC Count: 8.6 10*3/uL (ref 4.0–10.5)
nRBC: 0 % (ref 0.0–0.2)

## 2022-11-06 LAB — CMP (CANCER CENTER ONLY)
ALT: 33 U/L (ref 0–44)
AST: 22 U/L (ref 15–41)
Albumin: 3.1 g/dL — ABNORMAL LOW (ref 3.5–5.0)
Alkaline Phosphatase: 73 U/L (ref 38–126)
Anion gap: 7 (ref 5–15)
BUN: 19 mg/dL (ref 8–23)
CO2: 27 mmol/L (ref 22–32)
Calcium: 8.4 mg/dL — ABNORMAL LOW (ref 8.9–10.3)
Chloride: 97 mmol/L — ABNORMAL LOW (ref 98–111)
Creatinine: 0.56 mg/dL (ref 0.44–1.00)
GFR, Estimated: >60 mL/min (ref >60)
Glucose, Bld: 127 mg/dL — ABNORMAL HIGH (ref 70–99)
Potassium: 3.6 mmol/L (ref 3.5–5.1)
Sodium: 131 mmol/L — ABNORMAL LOW (ref 135–145)
Total Bilirubin: 0.6 mg/dL (ref 0.3–1.2)
Total Protein: 6.3 g/dL — ABNORMAL LOW (ref 6.5–8.1)

## 2022-11-06 MED ORDER — BORTEZOMIB CHEMO SQ INJECTION 3.5 MG (2.5MG/ML)
1.3000 mg/m2 | Freq: Once | INTRAMUSCULAR | Status: AC
  Administered 2022-11-06: 2.25 mg via SUBCUTANEOUS
  Filled 2022-11-06: qty 0.9

## 2022-11-06 NOTE — Progress Notes (Signed)
Pt took Decadron at home this am.

## 2022-11-06 NOTE — Patient Instructions (Signed)
[facility]  Discharge Instructions: Thank you for choosing [facility] to provide your oncology and hematology care.   If you have a lab appointment with the [facility], please go directly to the [facility] and check in at the registration area.   Wear comfortable clothing and clothing appropriate for easy access to any Portacath or PICC line.   We strive to give you quality time with your provider. You may need to reschedule your appointment if you arrive late (15 or more minutes).  Arriving late affects you and other patients whose appointments are after yours.  Also, if you miss three or more appointments without notifying the office, you may be dismissed from the clinic at the provider's discretion.      For prescription refill requests, have your pharmacy contact our office and allow 72 hours for refills to be completed.    Today you received the following chemotherapy and/or immunotherapy agents velcade      To help prevent nausea and vomiting after your treatment, we encourage you to take your nausea medication as directed.  BELOW ARE SYMPTOMS THAT SHOULD BE REPORTED IMMEDIATELY: *FEVER GREATER THAN 100.4 F (38 C) OR HIGHER *CHILLS OR SWEATING *NAUSEA AND VOMITING THAT IS NOT CONTROLLED WITH YOUR NAUSEA MEDICATION *UNUSUAL SHORTNESS OF BREATH *UNUSUAL BRUISING OR BLEEDING *URINARY PROBLEMS (pain or burning when urinating, or frequent urination) *BOWEL PROBLEMS (unusual diarrhea, constipation, pain near the anus) TENDERNESS IN MOUTH AND THROAT WITH OR WITHOUT PRESENCE OF ULCERS (sore throat, sores in mouth, or a toothache) UNUSUAL RASH, SWELLING OR PAIN  UNUSUAL VAGINAL DISCHARGE OR ITCHING   Items with * indicate a potential emergency and should be followed up as soon as possible or go to the Emergency Department if any problems should occur.  Please show the CHEMOTHERAPY ALERT CARD or IMMUNOTHERAPY ALERT CARD at check-in to the  Emergency Department and triage nurse.  Should you have questions after your visit or need to cancel or reschedule your appointment, please contact [facility]  Dept: [phone]  and follow the prompts.  Office hours are 8:00 a.m. to 4:30 p.m. Monday - Friday. Please note that voicemails left after 4:00 p.m. may not be returned until the following business day.  We are closed weekends and major holidays. You have access to a nurse at all times for urgent questions. Please call the main number to the clinic Dept: [phone] and follow the prompts.   For any non-urgent questions, you may also contact your provider using MyChart. We now offer e-Visits for anyone 22 and older to request care online for non-urgent symptoms. For details visit [email or web address].   Also download the MyChart app! Go to the app store, search "MyChart", open the app, select [facility], and log in with your MyChart username and password.

## 2022-11-07 ENCOUNTER — Telehealth: Payer: Self-pay | Admitting: Cardiology

## 2022-11-07 ENCOUNTER — Other Ambulatory Visit: Payer: Medicare Other

## 2022-11-07 ENCOUNTER — Ambulatory Visit: Payer: Medicare Other

## 2022-11-07 ENCOUNTER — Ambulatory Visit: Payer: Medicare Other | Admitting: Hematology and Oncology

## 2022-11-07 DIAGNOSIS — Z4889 Encounter for other specified surgical aftercare: Secondary | ICD-10-CM | POA: Diagnosis not present

## 2022-11-07 NOTE — Telephone Encounter (Signed)
Patient asked to speak with you when you are able to call her. Please call at [phone].

## 2022-11-08 ENCOUNTER — Encounter: Payer: Self-pay | Admitting: Podiatry

## 2022-11-08 ENCOUNTER — Ambulatory Visit: Payer: Medicare Other | Admitting: Podiatry

## 2022-11-08 DIAGNOSIS — B351 Tinea unguium: Secondary | ICD-10-CM

## 2022-11-08 DIAGNOSIS — M79674 Pain in right toe(s): Secondary | ICD-10-CM

## 2022-11-08 DIAGNOSIS — C9000 Multiple myeloma not having achieved remission: Secondary | ICD-10-CM

## 2022-11-08 DIAGNOSIS — M79675 Pain in left toe(s): Secondary | ICD-10-CM | POA: Diagnosis not present

## 2022-11-08 NOTE — Telephone Encounter (Signed)
Patient says Zoe [name] is to expensive this year and wants to know if there is another medication that you can change her to. I offered the patient to come by and and pick up patient assistance Paperwork. She says she will do that if there is no other medication to replace [name].

## 2022-11-08 NOTE — Telephone Encounter (Signed)
Jardiance or Wilder Glade are the most preferred for her condition, both are expensive

## 2022-11-08 NOTE — Progress Notes (Signed)
This patient presents to the office with chief complaint of long thick painful nails.  Patient says the nails are painful walking and wearing shoes.  This patient is unable to self treat.  This patient is unable to trim her nails since she is unable to reach her nails.  She presents to the office for preventative foot care services.  General Appearance  Alert, conversant and in no acute stress.  Vascular  Dorsalis pedis and posterior tibial  pulses are palpable  bilaterally.  Capillary return is within normal limits  bilaterally. Temperature is within normal limits  bilaterally.  Neurologic  Senn-Weinstein monofilament wire test within normal limits  bilaterally. Muscle power within normal limits bilaterally.  Nails Thick disfigured discolored nails with subungual debris  from hallux to fifth toes bilaterally. No evidence of bacterial infection or drainage bilaterally.  Orthopedic  No limitations of motion  feet .  No crepitus or effusions noted.  HAV  B/L.  MF DJD right foot.  Skin  normotropic skin with no porokeratosis noted bilaterally.  No signs of infections or ulcers noted.     Onychomycosis  Nails  B/L.  Pain in right toes  Pain in left toes  IE.  Debridement of nails both feet followed trimming the nails with dremel tool.    RTC 3 months.   Gardiner Barefoot DPM

## 2022-11-09 ENCOUNTER — Other Ambulatory Visit: Payer: Self-pay

## 2022-11-09 DIAGNOSIS — R7303 Prediabetes: Secondary | ICD-10-CM | POA: Diagnosis not present

## 2022-11-09 DIAGNOSIS — E559 Vitamin D deficiency, unspecified: Secondary | ICD-10-CM | POA: Diagnosis not present

## 2022-11-09 DIAGNOSIS — I10 Essential (primary) hypertension: Secondary | ICD-10-CM | POA: Diagnosis not present

## 2022-11-09 DIAGNOSIS — C9000 Multiple myeloma not having achieved remission: Secondary | ICD-10-CM

## 2022-11-09 DIAGNOSIS — E039 Hypothyroidism, unspecified: Secondary | ICD-10-CM | POA: Diagnosis not present

## 2022-11-09 MED ORDER — LENALIDOMIDE 25 MG PO CAPS: 25.0000 mg | ORAL_CAPSULE | Freq: Every day | ORAL | 0 refills | Status: DC

## 2022-11-09 NOTE — Telephone Encounter (Signed)
Patient will come by and pickup Patient assistance paperwork.

## 2022-11-10 DIAGNOSIS — E871 Hypo-osmolality and hyponatremia: Secondary | ICD-10-CM | POA: Diagnosis not present

## 2022-11-10 DIAGNOSIS — D649 Anemia, unspecified: Secondary | ICD-10-CM | POA: Diagnosis not present

## 2022-11-13 ENCOUNTER — Other Ambulatory Visit: Payer: Self-pay | Admitting: Medical Oncology

## 2022-11-14 ENCOUNTER — Other Ambulatory Visit: Payer: Self-pay

## 2022-11-14 ENCOUNTER — Inpatient Hospital Stay: Payer: Medicare Other

## 2022-11-14 ENCOUNTER — Inpatient Hospital Stay: Payer: Medicare Other | Admitting: Physician Assistant

## 2022-11-14 VITALS — BP 145/69 | HR 70 | Temp 97.8°F | Resp 17 | Ht 63.0 in | Wt 168.0 lb

## 2022-11-14 DIAGNOSIS — C9000 Multiple myeloma not having achieved remission: Secondary | ICD-10-CM

## 2022-11-14 DIAGNOSIS — G629 Polyneuropathy, unspecified: Secondary | ICD-10-CM

## 2022-11-14 DIAGNOSIS — E538 Deficiency of other specified B group vitamins: Secondary | ICD-10-CM | POA: Diagnosis not present

## 2022-11-14 DIAGNOSIS — D649 Anemia, unspecified: Secondary | ICD-10-CM | POA: Diagnosis not present

## 2022-11-14 DIAGNOSIS — R0602 Shortness of breath: Secondary | ICD-10-CM | POA: Diagnosis not present

## 2022-11-14 DIAGNOSIS — Z5112 Encounter for antineoplastic immunotherapy: Secondary | ICD-10-CM | POA: Diagnosis not present

## 2022-11-14 LAB — CBC WITH DIFFERENTIAL (CANCER CENTER ONLY)
Abs Immature Granulocytes: 0.01 10*3/uL (ref 0.00–0.07)
Basophils Absolute: 0.1 10*3/uL (ref 0.0–0.1)
Basophils Relative: 1 %
Eosinophils Absolute: 0.1 10*3/uL (ref 0.0–0.5)
Eosinophils Relative: 2 %
HCT: 36.5 % (ref 36.0–46.0)
Hemoglobin: 12.4 g/dL (ref 12.0–15.0)
Immature Granulocytes: 0 %
Lymphocytes Relative: 10 %
Lymphs Abs: 0.6 10*3/uL — ABNORMAL LOW (ref 0.7–4.0)
MCH: 29 pg (ref 26.0–34.0)
MCHC: 34 g/dL (ref 30.0–36.0)
MCV: 85.5 fL (ref 80.0–100.0)
Monocytes Absolute: 1.1 10*3/uL — ABNORMAL HIGH (ref 0.1–1.0)
Monocytes Relative: 17 %
Neutro Abs: 4.6 10*3/uL (ref 1.7–7.7)
Neutrophils Relative %: 70 %
Platelet Count: 189 10*3/uL (ref 150–400)
RBC: 4.27 MIL/uL (ref 3.87–5.11)
RDW: 16.9 % — ABNORMAL HIGH (ref 11.5–15.5)
WBC Count: 6.5 10*3/uL (ref 4.0–10.5)
nRBC: 0 % (ref 0.0–0.2)

## 2022-11-14 LAB — CMP (CANCER CENTER ONLY)
ALT: 19 U/L (ref 0–44)
AST: 16 U/L (ref 15–41)
Albumin: 3.2 g/dL — ABNORMAL LOW (ref 3.5–5.0)
Alkaline Phosphatase: 72 U/L (ref 38–126)
Anion gap: 8 (ref 5–15)
BUN: 17 mg/dL (ref 8–23)
CO2: 26 mmol/L (ref 22–32)
Calcium: 8.5 mg/dL — ABNORMAL LOW (ref 8.9–10.3)
Chloride: 96 mmol/L — ABNORMAL LOW (ref 98–111)
Creatinine: 0.64 mg/dL (ref 0.44–1.00)
GFR, Estimated: >60 mL/min (ref >60)
Glucose, Bld: 117 mg/dL — ABNORMAL HIGH (ref 70–99)
Potassium: 4 mmol/L (ref 3.5–5.1)
Sodium: 130 mmol/L — ABNORMAL LOW (ref 135–145)
Total Bilirubin: 0.6 mg/dL (ref 0.3–1.2)
Total Protein: 6 g/dL — ABNORMAL LOW (ref 6.5–8.1)

## 2022-11-14 MED ORDER — BORTEZOMIB CHEMO SQ INJECTION 3.5 MG (2.5MG/ML)
1.3000 mg/m2 | Freq: Once | INTRAMUSCULAR | Status: AC
  Administered 2022-11-14: 2.25 mg via SUBCUTANEOUS
  Filled 2022-11-14: qty 0.9

## 2022-11-14 NOTE — Patient Instructions (Signed)
[facility]  Discharge Instructions: Thank you for choosing [facility] to provide your oncology and hematology care.   If you have a lab appointment with the Cancer Center, please go directly to the Cancer Center and check in at the registration area.   Wear comfortable clothing and clothing appropriate for easy access to any Portacath or PICC line.   We strive to give you quality time with your provider. You may need to reschedule your appointment if you arrive late (15 or more minutes).  Arriving late affects you and other patients whose appointments are after yours.  Also, if you miss three or more appointments without notifying the office, you may be dismissed from the clinic at the provider's discretion.      For prescription refill requests, have your pharmacy contact our office and allow 72 hours for refills to be completed.    Today you received the following chemotherapy and/or immunotherapy agents velcade      To help prevent nausea and vomiting after your treatment, we encourage you to take your nausea medication as directed.  BELOW ARE SYMPTOMS THAT SHOULD BE REPORTED IMMEDIATELY: *FEVER GREATER THAN 100.4 F (38 C) OR HIGHER *CHILLS OR SWEATING *NAUSEA AND VOMITING THAT IS NOT CONTROLLED WITH YOUR NAUSEA MEDICATION *UNUSUAL SHORTNESS OF BREATH *UNUSUAL BRUISING OR BLEEDING *URINARY PROBLEMS (pain or burning when urinating, or frequent urination) *BOWEL PROBLEMS (unusual diarrhea, constipation, pain near the anus) TENDERNESS IN MOUTH AND THROAT WITH OR WITHOUT PRESENCE OF ULCERS (sore throat, sores in mouth, or a toothache) UNUSUAL RASH, SWELLING OR PAIN  UNUSUAL VAGINAL DISCHARGE OR ITCHING   Items with * indicate a potential emergency and should be followed up as soon as possible or go to the Emergency Department if any problems should occur.  Please show the CHEMOTHERAPY ALERT CARD or IMMUNOTHERAPY ALERT CARD at check-in  to the Emergency Department and triage nurse.  Should you have questions after your visit or need to cancel or reschedule your appointment, please contact [facility]  Dept: [phone]  and follow the prompts.  Office hours are 8:00 a.m. to 4:30 p.m. Monday - Friday. Please note that voicemails left after 4:00 p.m. may not be returned until the following business day.  We are closed weekends and major holidays. You have access to a nurse at all times for urgent questions. Please call the main number to the clinic Dept: [phone] and follow the prompts.   For any non-urgent questions, you may also contact your provider using MyChart. We now offer e-Visits for anyone 18 and older to request care online for non-urgent symptoms. For details visit [email or web address].   Also download the MyChart app! Go to the app store, search "MyChart", open the app, select [facility], and log in with your MyChart username and password.   

## 2022-11-15 DIAGNOSIS — C9000 Multiple myeloma not having achieved remission: Secondary | ICD-10-CM | POA: Diagnosis not present

## 2022-11-15 DIAGNOSIS — M353 Polymyalgia rheumatica: Secondary | ICD-10-CM | POA: Diagnosis not present

## 2022-11-15 DIAGNOSIS — M12812 Other specific arthropathies, not elsewhere classified, left shoulder: Secondary | ICD-10-CM | POA: Diagnosis not present

## 2022-11-15 DIAGNOSIS — M810 Age-related osteoporosis without current pathological fracture: Secondary | ICD-10-CM | POA: Diagnosis not present

## 2022-11-15 DIAGNOSIS — R7309 Other abnormal glucose: Secondary | ICD-10-CM | POA: Diagnosis not present

## 2022-11-15 DIAGNOSIS — K219 Gastro-esophageal reflux disease without esophagitis: Secondary | ICD-10-CM | POA: Diagnosis not present

## 2022-11-15 DIAGNOSIS — R011 Cardiac murmur, unspecified: Secondary | ICD-10-CM | POA: Diagnosis not present

## 2022-11-15 DIAGNOSIS — Z0000 Encounter for general adult medical examination without abnormal findings: Secondary | ICD-10-CM | POA: Diagnosis not present

## 2022-11-15 DIAGNOSIS — E039 Hypothyroidism, unspecified: Secondary | ICD-10-CM | POA: Diagnosis not present

## 2022-11-15 DIAGNOSIS — I5032 Chronic diastolic (congestive) heart failure: Secondary | ICD-10-CM | POA: Diagnosis not present

## 2022-11-15 DIAGNOSIS — I10 Essential (primary) hypertension: Secondary | ICD-10-CM | POA: Diagnosis not present

## 2022-11-16 ENCOUNTER — Encounter: Payer: Self-pay | Admitting: Hematology and Oncology

## 2022-11-16 MED ORDER — PREGABALIN 50 MG PO CAPS: 50.0000 mg | ORAL_CAPSULE | Freq: Two times a day (BID) | ORAL | 0 refills | Status: DC

## 2022-11-16 NOTE — Progress Notes (Signed)
[facility] [phone]   [phone]  PROGRESS NOTE  Patient Care Team: [facility] [place] as PCP - General (Rheumatology)  Hematological/Oncological History # IgG Lambda Multiple Myeloma  06/14/2022: establish care with [name] due to anemia. Labs showed M protein 4.9, Kappa 7.2, Lambda 10.8, ratio 0.67 07/13/2022: Bmbx showed Lambda restricted plasma cell neoplasm involving approximately 60% of the cellular marrow by IHC on the biopsy.  08/01/2022: Cycle 1 Day 1 of VRd chemotherapy. (Holding revlimid initially) 08/21/2022:  Cycle 2 Day 1 of VRd chemotherapy. (Holding revlimid) 09/04/2022: Cycle 3 Day 1 of VRd chemotherapy. Started revlimid 10/03/2022: Cycle 4 Day 1 of VRd chemotherapy 10/31/2022: Cycle 5 Day 1 of VRd chemotherapy  Interval History:  Zoe [name] 89 y.o. female with medical history significant for newly diagnosed multiple myeloma who presents for a follow up visit. The patient's last visit was on 10/31/2022. In the interim since the last visit she has continued VRd chemotherapy.   On exam today Zoe [name] reports her biggest complaint is neuropathic pain involving her left leg, mainly the thigh. She adds the pain is persistent and prevents her from sleeping. She takes Lyrica 2 doses at night and 1 dose in the morning with minimal improvement.  She met with her orthopedist, Dr. Rolena Infante, who recommended water aerobics before trying steroid injections. Her energy and appetite are stable. She denies nausea, vomiting or abdominal pain. Her bowel habits are unchanged without recurrent episodes of diarrhea or constipation. She denies fevers, chills, sweats, shortness of breath, chest pain or cough. She has no other complaints. Full 10 point ROS was otherwise negative.  MEDICAL HISTORY:  Past Medical History:  Diagnosis Date   Arthritis    some - per patient   Breast cancer ([place])    breast cancer / left    Cataract     bilat    GERD (gastroesophageal reflux disease)    History of kidney stones    Hyperlipidemia    Hypertension    Hypothyroidism    Macular degeneration    Left   S/P TAVR (transcatheter aortic valve replacement) 09/03/2018   23 mm Edwards Sapien 3 transcatheter heart valve placed via percutaneous right transfemoral approach    Severe aortic stenosis    Stress incontinence    Thyroid disease    Tinnitus     SURGICAL HISTORY: Past Surgical History:  Procedure Laterality Date   ABDOMINAL HYSTERECTOMY  1970's   BACK SURGERY     BREAST LUMPECTOMY  12/1998   lumpectomy   CARDIAC CATHETERIZATION     EYE SURGERY     cataract surgery bilat    INTRAOPERATIVE TRANSTHORACIC ECHOCARDIOGRAM N/A 09/03/2018   Procedure: INTRAOPERATIVE TRANSTHORACIC ECHOCARDIOGRAM;  Surgeon: Burnell Blanks, MD;  Location: [facility];  Service: Open Heart Surgery;  Laterality: N/A;   KYPHOPLASTY N/A 09/07/2022   Procedure: THORACIC EIGHT KYPHOPLASTY;  Surgeon: Melina Schools, MD;  Location: [facility];  Service: Orthopedics;  Laterality: N/A;  1 hr Local with [facility] 3 C-Bed   LITHOTRIPSY     Right total knee     2018 Dr. Alvan Dame   RIGHT/LEFT HEART CATH AND CORONARY ANGIOGRAPHY N/A 08/06/2018   Procedure: RIGHT/LEFT HEART CATH AND CORONARY ANGIOGRAPHY;  Surgeon: Adrian Prows, MD;  Location: [facility];  Service: Cardiovascular;  Laterality: N/A;   THYROIDECTOMY, PARTIAL  1975   TONSILLECTOMY     as a child - patient not sure of exact date   TOTAL KNEE  ARTHROPLASTY Left 03/13/2016   Procedure: TOTAL KNEE ARTHROPLASTY;  Surgeon: Paralee Cancel, MD;  Location: [facility];  Service: Orthopedics;  Laterality: Left;   TOTAL KNEE ARTHROPLASTY Right 06/18/2017   Procedure: RIGHT TOTAL KNEE ARTHROPLASTY;  Surgeon: Paralee Cancel, MD;  Location: [facility];  Service: Orthopedics;  Laterality: Right;   TRANSCATHETER AORTIC VALVE REPLACEMENT, TRANSFEMORAL N/A 09/03/2018   Procedure: TRANSCATHETER AORTIC VALVE REPLACEMENT,  TRANSFEMORAL;  Surgeon: Burnell Blanks, MD;  Location: [facility];  Service: Open Heart Surgery;  Laterality: N/A;    SOCIAL HISTORY: Social History   Socioeconomic History   Marital status: Widowed    Spouse name: Not on file   Number of children: 4   Years of education: Not on file   Highest education level: Master's degree (e.g., MA, MS, MEng, MEd, MSW, MBA)  Occupational History   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  Tobacco Use   Smoking status: Never   Smokeless tobacco: Never  Vaping Use   Vaping Use: Never used  Substance and Sexual Activity   Alcohol use: No   Drug use: No   Sexual activity: Not Currently  Other Topics Concern   Not on file  Social History Narrative   Not on file   Social Determinants of Health   Financial Resource Strain: Low Risk  (11/12/2018)   Overall Financial Resource Strain (CARDIA)    Difficulty of Paying Living Expenses: Not very hard  Food Insecurity: No Food Insecurity (11/12/2018)   Hunger Vital Sign    Worried About Running Out of Food in the Last Year: Never true    Ran Out of Food in the Last Year: Never true  Transportation Needs: No Transportation Needs (11/12/2018)   PRAPARE - Hydrologist (Medical): No    Lack of Transportation (Non-Medical): No  Physical Activity: Inactive (11/12/2018)   Exercise Vital Sign    Days of Exercise per Week: 0 days    Minutes of Exercise per Session: 0 min  Stress: Stress Concern Present (11/12/2018)   [place]    Feeling of Stress : To some extent  Social Connections: Not on file  Intimate Partner Violence: Not on file    FAMILY HISTORY: Family History  Problem Relation Age of Onset   Diabetes Mother    Stroke Mother        Carotid artery disease   Heart disease Father        CAD   Coronary artery disease Father    Diabetes Sister     ALLERGIES:  is  allergic to penicillins and sulfa antibiotics.  MEDICATIONS:  Current Outpatient Medications  Medication Sig Dispense Refill   acyclovir (ZOVIRAX) 400 MG tablet Take 400 mg by mouth 2 (two) times daily. Pt takes 1 tablet (400 mg total) by mouth 2 (two) times daily.     Artificial Tear Solution (SOOTHE XP) SOLN Place 1 drop into both eyes every evening.     aspirin EC 81 MG tablet Take 81 mg by mouth at bedtime. Swallow whole.     calcitonin, salmon, (MIACALCIN/FORTICAL) 200 UNIT/ACT nasal spray Place 1 spray into alternate nostrils daily.     carvedilol (COREG) 12.5 MG tablet Take 12.5 mg by mouth 2 (two) times daily with a meal.     Cholecalciferol (VITAMIN D3) 50 MCG (2000 UT) capsule Take 1 capsule (2,000 Units total) by mouth daily.     cyanocobalamin (VITAMIN B12) 1000  MCG tablet Take 1,000 mcg by mouth daily.     dapagliflozin propanediol (FARXIGA) 10 MG TABS tablet TAKE 1 TABLET BY MOUTH EVERY DAY 90 tablet 2   dexamethasone (DECADRON) 4 MG tablet Take 5 tablets (20 mg total) by mouth once a week. Take 20 mg (5 tablets) on day of multiple myeloma treatment 20 tablet 5   esomeprazole (NEXIUM) 20 MG capsule Take 20 mg by mouth daily as needed (Heartburn).     ferrous sulfate 325 (65 FE) MG tablet Take 325 mg by mouth daily with breakfast.     furosemide (LASIX) 20 MG tablet PLEASE SEE ATTACHED FOR DETAILED DIRECTIONS (Patient taking differently: Take 20 mg by mouth daily.) 90 tablet 1   lenalidomide (REVLIMID) 25 MG capsule Take 1 capsule (25 mg total) by mouth daily. Take for 14 days, then none for 7 days. Repeat every 21 days. [employer] Auth # [id number] Date Obtained 11/09/2022 14 capsule 0   levothyroxine (SYNTHROID, LEVOTHROID) 100 MCG tablet Take 100 mcg by mouth daily before breakfast.  2   losartan (COZAAR) 25 MG tablet Take 1 tablet (25 mg total) by mouth daily. 90 tablet 1   polyethylene glycol (MIRALAX / GLYCOLAX) 17 g packet Take 17 g by mouth daily as needed for mild constipation.      potassium chloride (KLOR-CON M10) 10 MEQ tablet TAKE 2 TABLETS BY MOUTH EVERY DAY AS NEEDED WITH FUROSEMIDE 180 tablet 1   pravastatin (PRAVACHOL) 40 MG tablet Take 40 mg by mouth every evening.     pregabalin (LYRICA) 25 MG capsule Take 1 capsule (25 mg total) by mouth 2 (two) times daily. 60 capsule 2   HYDROcodone-acetaminophen (NORCO/VICODIN) 5-325 MG tablet Take 1 tablet by mouth 3 (three) times daily. (Patient not taking: Reported on 11/06/2022)     ondansetron (ZOFRAN) 8 MG tablet Take 1 tablet (8 mg total) by mouth every 8 (eight) hours as needed. (Patient not taking: Reported on 11/06/2022) 30 tablet 0   prochlorperazine (COMPAZINE) 10 MG tablet Take 1 tablet (10 mg total) by mouth every 6 (six) hours as needed for nausea or vomiting. (Patient not taking: Reported on 11/06/2022) 30 tablet 0   No current facility-administered medications for this visit.    REVIEW OF SYSTEMS:   Constitutional: ( - ) fevers, ( - )  chills , ( - ) night sweats Eyes: ( - ) blurriness of vision, ( - ) double vision, ( - ) watery eyes Ears, nose, mouth, throat, and face: ( - ) mucositis, ( - ) sore throat Respiratory: ( - ) cough, ( - ) dyspnea, ( - ) wheezes Cardiovascular: ( - ) palpitation, ( - ) chest discomfort, ( - ) lower extremity swelling Gastrointestinal:  ( - ) nausea, ( - ) heartburn, ( - ) change in bowel habits Skin: ( - ) abnormal skin rashes Lymphatics: ( - ) new lymphadenopathy, ( - ) easy bruising Neurological: ( - ) numbness, ( - ) tingling, ( - ) new weaknesses Behavioral/Psych: ( - ) mood change, ( - ) new changes  All other systems were reviewed with the patient and are negative.  PHYSICAL EXAMINATION: ECOG PERFORMANCE STATUS: 1 - Symptomatic but completely ambulatory  Vitals:   11/14/22 0841  BP: (!) 145/69  Pulse: 70  Resp: 17  Temp: 97.8 F (36.6 C)  SpO2: 100%     Filed Weights   11/14/22 0841  Weight: 168 lb (76.2 kg)      GENERAL: well appearing elderly  Caucasian female, alert, no distress and comfortable SKIN: skin color, texture, turgor are normal, no rashes or significant lesions EYES: conjunctiva are pink and non-injected, sclera clear LUNGS: clear to auscultation and percussion with normal breathing effort HEART: regular rate & rhythm and no murmurs and no lower extremity edema Musculoskeletal: no cyanosis of digits and no clubbing  PSYCH: alert & oriented x 3, fluent speech NEURO: no focal motor/sensory deficits  LABORATORY DATA:  I have reviewed the data as listed    Latest Ref Rng & Units 11/14/2022    8:22 AM 11/06/2022    8:35 AM 10/31/2022    9:15 AM  CBC  WBC 4.0 - 10.5 K/uL 6.5  8.6  11.4   Hemoglobin 12.0 - 15.0 g/dL 12.4  12.5  12.4   Hematocrit 36.0 - 46.0 % 36.5  36.1  37.0   Platelets 150 - 400 K/uL 189  244  276        Latest Ref Rng & Units 11/14/2022    8:22 AM 11/06/2022    8:35 AM 10/31/2022    9:15 AM  CMP  Glucose 70 - 99 mg/dL 117  127  115   BUN 8 - 23 mg/dL '17  19  18   '$ Creatinine 0.44 - 1.00 mg/dL 0.64  0.56  0.61   Sodium 135 - 145 mmol/L 130  131  131   Potassium 3.5 - 5.1 mmol/L 4.0  3.6  3.9   Chloride 98 - 111 mmol/L 96  97  98   CO2 22 - 32 mmol/L '26  27  25   '$ Calcium 8.9 - 10.3 mg/dL 8.5  8.4  8.5   Total Protein 6.5 - 8.1 g/dL 6.0  6.3  6.6   Total Bilirubin 0.3 - 1.2 mg/dL 0.6  0.6  0.6   Alkaline Phos 38 - 126 U/L 72  73  78   AST 15 - 41 U/L 16  22  71   ALT 0 - 44 U/L 19  33  64     Lab Results  Component Value Date   MPROTEIN 1.3 (H) 10/31/2022   MPROTEIN 2.2 (H) 09/25/2022   MPROTEIN 3.3 (H) 08/28/2022   Lab Results  Component Value Date   KPAFRELGTCHN 11.9 10/31/2022   KPAFRELGTCHN 8.7 09/25/2022   KPAFRELGTCHN 4.3 08/28/2022   LAMBDASER 7.9 10/31/2022   LAMBDASER 6.1 09/25/2022   LAMBDASER 6.2 08/28/2022   KAPLAMBRATIO 1.51 10/31/2022   KAPLAMBRATIO 1.43 09/25/2022   KAPLAMBRATIO 0.69 08/28/2022    RADIOGRAPHIC STUDIES: No results found.  ASSESSMENT &  PLAN [name] 89 y.o. female with medical history significant for multiple myeloma who presents for a follow up visit.   After review of the labs, review the records, discussion with the patient the findings are most consistent with an IgG lambda multiple myeloma.  The patient meets the diagnostic criteria based on 60% plasma cells in the bone marrow and anemia.  Additionally the patient was noted to have vitamin B12 deficiency which is currently being treated as well.  Given her degree of anemia would recommend proceeding with VD chemotherapy and will add Revlimid once hemoglobin improves.  The patient and her daughter voiced understanding of the plan moving forward.  # IgG Lambda Multiple Myeloma  --diagnosis of MM confirmed with bone marrow biopsy showing 60% plasma cells and anemia -- Bone survey shows no lytic lesions, kidney function is within normal limits.  --08/01/2022 was Cycle 1 Day 1 of Vd  Plan:  --  Today is Cycle 5 Day 15 of Vd chemotherapy.  --Labs show white blood cell count 6.5, hemoglobin 12.4, MCV 85.5, and platelets of 189. --at each visit will collect CBC, CMP, and LDH with monthly restaging labs SPEP and SFLC.  --RTC in 2 weeks with interval weekly treatment.   # Normocytic Anemia #Vitamin B12 Deficiency --Anemia likely driven by multiple myeloma, but may be component of vitamin B12 deficiency as well. --initial labs showed elevated MMA with B12 180 -- Continue vitamin B12 1000 mcg p.o. daily -- Continue to monitor  #Left leg/thigh neuropathy: --Currently on Lyrica 25 mg PO In the AM and 50 mg PO in the PM. Minimal relief. Recommend to try Lyrica 50 mg BID. New script sent --Encouraged to try water aerobics and stretches.  --Following with Dr. Dietrich Pates (ortho) to see if she is a candidate for steroid injections.  --Sent referral to Dr. Mickeal Skinner for additional recommendations.  #Supportive Care -- chemotherapy education complete -- port placement not required.   --Awaiting to start Zometa/Xgeva  -- zofran '8mg'$  q8H PRN and compazine '10mg'$  PO q6H for nausea -- acyclovir '400mg'$  PO BID for VCZ prophylaxis -- tylenol 1000 mg q8H PRN for back pain.   No orders of the defined types were placed in this encounter.   All questions were answered. The patient knows to call the clinic with any problems, questions or concerns.  A total of more than 30 minutes were spent on this encounter with face-to-face time and non-face-to-face time, including preparing to see the patient, ordering tests and/or medications, counseling the patient and coordination of care as outlined above.   [name] PA-C Dept of Hematology and [place] at [facility] Phone: [phone]   11/16/2022 9:00 AM

## 2022-11-17 DIAGNOSIS — M6281 Muscle weakness (generalized): Secondary | ICD-10-CM | POA: Diagnosis not present

## 2022-11-17 DIAGNOSIS — R2689 Other abnormalities of gait and mobility: Secondary | ICD-10-CM | POA: Diagnosis not present

## 2022-11-17 DIAGNOSIS — M545 Low back pain, unspecified: Secondary | ICD-10-CM | POA: Diagnosis not present

## 2022-11-17 DIAGNOSIS — M546 Pain in thoracic spine: Secondary | ICD-10-CM | POA: Diagnosis not present

## 2022-11-20 ENCOUNTER — Inpatient Hospital Stay: Payer: Medicare Other

## 2022-11-20 ENCOUNTER — Other Ambulatory Visit: Payer: Self-pay

## 2022-11-20 VITALS — BP 125/67 | HR 75 | Temp 98.4°F | Resp 18 | Wt 158.8 lb

## 2022-11-20 DIAGNOSIS — E538 Deficiency of other specified B group vitamins: Secondary | ICD-10-CM | POA: Diagnosis not present

## 2022-11-20 DIAGNOSIS — C9000 Multiple myeloma not having achieved remission: Secondary | ICD-10-CM

## 2022-11-20 DIAGNOSIS — Z5112 Encounter for antineoplastic immunotherapy: Secondary | ICD-10-CM | POA: Diagnosis not present

## 2022-11-20 DIAGNOSIS — R0602 Shortness of breath: Secondary | ICD-10-CM | POA: Diagnosis not present

## 2022-11-20 DIAGNOSIS — D649 Anemia, unspecified: Secondary | ICD-10-CM | POA: Diagnosis not present

## 2022-11-20 LAB — CBC WITH DIFFERENTIAL (CANCER CENTER ONLY)
Abs Immature Granulocytes: 0.02 10*3/uL (ref 0.00–0.07)
Basophils Absolute: 0.1 10*3/uL (ref 0.0–0.1)
Basophils Relative: 1 %
Eosinophils Absolute: 0.2 10*3/uL (ref 0.0–0.5)
Eosinophils Relative: 2 %
HCT: 38.2 % (ref 36.0–46.0)
Hemoglobin: 13 g/dL (ref 12.0–15.0)
Immature Granulocytes: 0 %
Lymphocytes Relative: 11 %
Lymphs Abs: 0.8 10*3/uL (ref 0.7–4.0)
MCH: 29.3 pg (ref 26.0–34.0)
MCHC: 34 g/dL (ref 30.0–36.0)
MCV: 86 fL (ref 80.0–100.0)
Monocytes Absolute: 0.9 10*3/uL (ref 0.1–1.0)
Monocytes Relative: 13 %
Neutro Abs: 5.1 10*3/uL (ref 1.7–7.7)
Neutrophils Relative %: 73 %
Platelet Count: 217 10*3/uL (ref 150–400)
RBC: 4.44 MIL/uL (ref 3.87–5.11)
RDW: 17.2 % — ABNORMAL HIGH (ref 11.5–15.5)
WBC Count: 7.1 10*3/uL (ref 4.0–10.5)
nRBC: 0 % (ref 0.0–0.2)

## 2022-11-20 LAB — CMP (CANCER CENTER ONLY)
ALT: 14 U/L (ref 0–44)
AST: 15 U/L (ref 15–41)
Albumin: 3.2 g/dL — ABNORMAL LOW (ref 3.5–5.0)
Alkaline Phosphatase: 70 U/L (ref 38–126)
Anion gap: 6 (ref 5–15)
BUN: 21 mg/dL (ref 8–23)
CO2: 27 mmol/L (ref 22–32)
Calcium: 8.6 mg/dL — ABNORMAL LOW (ref 8.9–10.3)
Chloride: 97 mmol/L — ABNORMAL LOW (ref 98–111)
Creatinine: 0.72 mg/dL (ref 0.44–1.00)
GFR, Estimated: >60 mL/min (ref >60)
Glucose, Bld: 123 mg/dL — ABNORMAL HIGH (ref 70–99)
Potassium: 4.1 mmol/L (ref 3.5–5.1)
Sodium: 130 mmol/L — ABNORMAL LOW (ref 135–145)
Total Bilirubin: 0.5 mg/dL (ref 0.3–1.2)
Total Protein: 6.3 g/dL — ABNORMAL LOW (ref 6.5–8.1)

## 2022-11-20 MED ORDER — BORTEZOMIB CHEMO SQ INJECTION 3.5 MG (2.5MG/ML)
1.3000 mg/m2 | Freq: Once | INTRAMUSCULAR | Status: AC
  Administered 2022-11-20: 2.25 mg via SUBCUTANEOUS
  Filled 2022-11-20: qty 0.9

## 2022-11-20 NOTE — Patient Instructions (Signed)
[facility]  Discharge Instructions: Thank you for choosing [facility] to provide your oncology and hematology care.   If you have a lab appointment with the Cancer Center, please go directly to the Cancer Center and check in at the registration area.   Wear comfortable clothing and clothing appropriate for easy access to any Portacath or PICC line.   We strive to give you quality time with your provider. You may need to reschedule your appointment if you arrive late (15 or more minutes).  Arriving late affects you and other patients whose appointments are after yours.  Also, if you miss three or more appointments without notifying the office, you may be dismissed from the clinic at the provider's discretion.      For prescription refill requests, have your pharmacy contact our office and allow 72 hours for refills to be completed.    Today you received the following chemotherapy and/or immunotherapy agents velcade      To help prevent nausea and vomiting after your treatment, we encourage you to take your nausea medication as directed.  BELOW ARE SYMPTOMS THAT SHOULD BE REPORTED IMMEDIATELY: *FEVER GREATER THAN 100.4 F (38 C) OR HIGHER *CHILLS OR SWEATING *NAUSEA AND VOMITING THAT IS NOT CONTROLLED WITH YOUR NAUSEA MEDICATION *UNUSUAL SHORTNESS OF BREATH *UNUSUAL BRUISING OR BLEEDING *URINARY PROBLEMS (pain or burning when urinating, or frequent urination) *BOWEL PROBLEMS (unusual diarrhea, constipation, pain near the anus) TENDERNESS IN MOUTH AND THROAT WITH OR WITHOUT PRESENCE OF ULCERS (sore throat, sores in mouth, or a toothache) UNUSUAL RASH, SWELLING OR PAIN  UNUSUAL VAGINAL DISCHARGE OR ITCHING   Items with * indicate a potential emergency and should be followed up as soon as possible or go to the Emergency Department if any problems should occur.  Please show the CHEMOTHERAPY ALERT CARD or IMMUNOTHERAPY ALERT CARD at check-in  to the Emergency Department and triage nurse.  Should you have questions after your visit or need to cancel or reschedule your appointment, please contact [facility]  Dept: [phone]  and follow the prompts.  Office hours are 8:00 a.m. to 4:30 p.m. Monday - Friday. Please note that voicemails left after 4:00 p.m. may not be returned until the following business day.  We are closed weekends and major holidays. You have access to a nurse at all times for urgent questions. Please call the main number to the clinic Dept: [phone] and follow the prompts.   For any non-urgent questions, you may also contact your provider using MyChart. We now offer e-Visits for anyone 18 and older to request care online for non-urgent symptoms. For details visit [email or web address].   Also download the MyChart app! Go to the app store, search "MyChart", open the app, select [place], and log in with your MyChart username and password.   

## 2022-11-20 NOTE — Progress Notes (Signed)
Dexamethasone taken at home today at 7:30am

## 2022-11-21 ENCOUNTER — Other Ambulatory Visit: Payer: Medicare Other

## 2022-11-21 ENCOUNTER — Ambulatory Visit: Payer: Medicare Other

## 2022-11-22 ENCOUNTER — Telehealth: Payer: Self-pay

## 2022-11-22 DIAGNOSIS — M546 Pain in thoracic spine: Secondary | ICD-10-CM | POA: Diagnosis not present

## 2022-11-22 DIAGNOSIS — M545 Low back pain, unspecified: Secondary | ICD-10-CM | POA: Diagnosis not present

## 2022-11-22 DIAGNOSIS — M6281 Muscle weakness (generalized): Secondary | ICD-10-CM | POA: Diagnosis not present

## 2022-11-22 DIAGNOSIS — R2689 Other abnormalities of gait and mobility: Secondary | ICD-10-CM | POA: Diagnosis not present

## 2022-11-22 NOTE — Telephone Encounter (Signed)
Patient dropped off [name] patient assistance form but did not sign it or provide proof of income. I reached out to patient but got no answer LVMTCB. I also reached out to son and spoke with him he is going to speak with his mother and have her contact us.

## 2022-11-23 ENCOUNTER — Telehealth: Payer: Self-pay

## 2022-11-23 NOTE — Telephone Encounter (Signed)
Patient returned all [place] application along with income verification. Paperwork Faxed.

## 2022-11-24 ENCOUNTER — Telehealth: Payer: Self-pay

## 2022-11-24 DIAGNOSIS — R2689 Other abnormalities of gait and mobility: Secondary | ICD-10-CM | POA: Diagnosis not present

## 2022-11-24 DIAGNOSIS — M545 Low back pain, unspecified: Secondary | ICD-10-CM | POA: Diagnosis not present

## 2022-11-24 DIAGNOSIS — M6281 Muscle weakness (generalized): Secondary | ICD-10-CM | POA: Diagnosis not present

## 2022-11-24 DIAGNOSIS — M546 Pain in thoracic spine: Secondary | ICD-10-CM | POA: Diagnosis not present

## 2022-11-24 NOTE — Telephone Encounter (Signed)
Per Dr. Lorenso Courier, Patient OK to have tooth extraction. Patient in the process of finding a dentist that is in network for her [place]. When she decided on a dentist, our office will contact then to give the OK for the extraction. Dr. Lorenso Courier would also like a dental clearance form faxed for Zometa.

## 2022-11-26 ENCOUNTER — Other Ambulatory Visit: Payer: Self-pay | Admitting: Cardiology

## 2022-11-26 DIAGNOSIS — I5031 Acute diastolic (congestive) heart failure: Secondary | ICD-10-CM

## 2022-11-27 ENCOUNTER — Other Ambulatory Visit: Payer: Self-pay

## 2022-11-27 DIAGNOSIS — R2689 Other abnormalities of gait and mobility: Secondary | ICD-10-CM | POA: Diagnosis not present

## 2022-11-27 DIAGNOSIS — M6281 Muscle weakness (generalized): Secondary | ICD-10-CM | POA: Diagnosis not present

## 2022-11-27 DIAGNOSIS — M545 Low back pain, unspecified: Secondary | ICD-10-CM | POA: Diagnosis not present

## 2022-11-27 DIAGNOSIS — C9000 Multiple myeloma not having achieved remission: Secondary | ICD-10-CM

## 2022-11-27 DIAGNOSIS — M546 Pain in thoracic spine: Secondary | ICD-10-CM | POA: Diagnosis not present

## 2022-11-27 MED ORDER — LENALIDOMIDE 25 MG PO CAPS: 25.0000 mg | ORAL_CAPSULE | Freq: Every day | ORAL | 0 refills | Status: DC

## 2022-11-28 ENCOUNTER — Other Ambulatory Visit: Payer: Self-pay

## 2022-11-28 ENCOUNTER — Inpatient Hospital Stay: Payer: Medicare Other

## 2022-11-28 ENCOUNTER — Inpatient Hospital Stay (HOSPITAL_BASED_OUTPATIENT_CLINIC_OR_DEPARTMENT_OTHER): Payer: Medicare Other | Admitting: Internal Medicine

## 2022-11-28 ENCOUNTER — Inpatient Hospital Stay: Payer: Medicare Other | Attending: Physician Assistant

## 2022-11-28 ENCOUNTER — Inpatient Hospital Stay: Payer: Medicare Other | Admitting: Hematology and Oncology

## 2022-11-28 VITALS — BP 139/73 | HR 67 | Temp 97.9°F | Resp 16 | Wt 162.8 lb

## 2022-11-28 DIAGNOSIS — T451X5A Adverse effect of antineoplastic and immunosuppressive drugs, initial encounter: Secondary | ICD-10-CM | POA: Diagnosis not present

## 2022-11-28 DIAGNOSIS — M79604 Pain in right leg: Secondary | ICD-10-CM

## 2022-11-28 DIAGNOSIS — Z5112 Encounter for antineoplastic immunotherapy: Secondary | ICD-10-CM | POA: Insufficient documentation

## 2022-11-28 DIAGNOSIS — E538 Deficiency of other specified B group vitamins: Secondary | ICD-10-CM | POA: Insufficient documentation

## 2022-11-28 DIAGNOSIS — G620 Drug-induced polyneuropathy: Secondary | ICD-10-CM | POA: Diagnosis not present

## 2022-11-28 DIAGNOSIS — D649 Anemia, unspecified: Secondary | ICD-10-CM | POA: Diagnosis not present

## 2022-11-28 DIAGNOSIS — M79605 Pain in left leg: Secondary | ICD-10-CM

## 2022-11-28 DIAGNOSIS — C9000 Multiple myeloma not having achieved remission: Secondary | ICD-10-CM

## 2022-11-28 LAB — CBC WITH DIFFERENTIAL (CANCER CENTER ONLY)
Abs Immature Granulocytes: 0.05 10*3/uL (ref 0.00–0.07)
Basophils Absolute: 0.1 10*3/uL (ref 0.0–0.1)
Basophils Relative: 1 %
Eosinophils Absolute: 0.2 10*3/uL (ref 0.0–0.5)
Eosinophils Relative: 2 %
HCT: 37.2 % (ref 36.0–46.0)
Hemoglobin: 12.6 g/dL (ref 12.0–15.0)
Immature Granulocytes: 1 %
Lymphocytes Relative: 5 %
Lymphs Abs: 0.5 10*3/uL — ABNORMAL LOW (ref 0.7–4.0)
MCH: 29.4 pg (ref 26.0–34.0)
MCHC: 33.9 g/dL (ref 30.0–36.0)
MCV: 86.9 fL (ref 80.0–100.0)
Monocytes Absolute: 0.5 10*3/uL (ref 0.1–1.0)
Monocytes Relative: 5 %
Neutro Abs: 8.5 10*3/uL — ABNORMAL HIGH (ref 1.7–7.7)
Neutrophils Relative %: 86 %
Platelet Count: 225 10*3/uL (ref 150–400)
RBC: 4.28 MIL/uL (ref 3.87–5.11)
RDW: 16.9 % — ABNORMAL HIGH (ref 11.5–15.5)
WBC Count: 9.9 10*3/uL (ref 4.0–10.5)
nRBC: 0 % (ref 0.0–0.2)

## 2022-11-28 LAB — CMP (CANCER CENTER ONLY)
ALT: 10 U/L (ref 0–44)
AST: 16 U/L (ref 15–41)
Albumin: 3.2 g/dL — ABNORMAL LOW (ref 3.5–5.0)
Alkaline Phosphatase: 65 U/L (ref 38–126)
Anion gap: 7 (ref 5–15)
BUN: 17 mg/dL (ref 8–23)
CO2: 26 mmol/L (ref 22–32)
Calcium: 8.4 mg/dL — ABNORMAL LOW (ref 8.9–10.3)
Chloride: 98 mmol/L (ref 98–111)
Creatinine: 0.59 mg/dL (ref 0.44–1.00)
GFR, Estimated: >60 mL/min (ref >60)
Glucose, Bld: 113 mg/dL — ABNORMAL HIGH (ref 70–99)
Potassium: 3.9 mmol/L (ref 3.5–5.1)
Sodium: 131 mmol/L — ABNORMAL LOW (ref 135–145)
Total Bilirubin: 0.7 mg/dL (ref 0.3–1.2)
Total Protein: 5.8 g/dL — ABNORMAL LOW (ref 6.5–8.1)

## 2022-11-28 MED ORDER — BORTEZOMIB CHEMO SQ INJECTION 3.5 MG (2.5MG/ML)
1.3000 mg/m2 | Freq: Once | INTRAMUSCULAR | Status: AC
  Administered 2022-11-28: 2.25 mg via SUBCUTANEOUS
  Filled 2022-11-28: qty 0.9

## 2022-11-28 MED ORDER — GABAPENTIN 300 MG PO CAPS: 300.0000 mg | ORAL_CAPSULE | Freq: Two times a day (BID) | ORAL | 2 refills | Status: DC

## 2022-11-28 NOTE — Patient Instructions (Signed)
[facility]  Discharge Instructions: Thank you for choosing [facility] to provide your oncology and hematology care.   If you have a lab appointment with the Cancer Center, please go directly to the Cancer Center and check in at the registration area.   Wear comfortable clothing and clothing appropriate for easy access to any Portacath or PICC line.   We strive to give you quality time with your provider. You may need to reschedule your appointment if you arrive late (15 or more minutes).  Arriving late affects you and other patients whose appointments are after yours.  Also, if you miss three or more appointments without notifying the office, you may be dismissed from the clinic at the provider's discretion.      For prescription refill requests, have your pharmacy contact our office and allow 72 hours for refills to be completed.    Today you received the following chemotherapy and/or immunotherapy agents velcade      To help prevent nausea and vomiting after your treatment, we encourage you to take your nausea medication as directed.  BELOW ARE SYMPTOMS THAT SHOULD BE REPORTED IMMEDIATELY: *FEVER GREATER THAN 100.4 F (38 C) OR HIGHER *CHILLS OR SWEATING *NAUSEA AND VOMITING THAT IS NOT CONTROLLED WITH YOUR NAUSEA MEDICATION *UNUSUAL SHORTNESS OF BREATH *UNUSUAL BRUISING OR BLEEDING *URINARY PROBLEMS (pain or burning when urinating, or frequent urination) *BOWEL PROBLEMS (unusual diarrhea, constipation, pain near the anus) TENDERNESS IN MOUTH AND THROAT WITH OR WITHOUT PRESENCE OF ULCERS (sore throat, sores in mouth, or a toothache) UNUSUAL RASH, SWELLING OR PAIN  UNUSUAL VAGINAL DISCHARGE OR ITCHING   Items with * indicate a potential emergency and should be followed up as soon as possible or go to the Emergency Department if any problems should occur.  Please show the CHEMOTHERAPY ALERT CARD or IMMUNOTHERAPY ALERT CARD at check-in  to the Emergency Department and triage nurse.  Should you have questions after your visit or need to cancel or reschedule your appointment, please contact [facility]  Dept: [phone]  and follow the prompts.  Office hours are 8:00 a.m. to 4:30 p.m. Monday - Friday. Please note that voicemails left after 4:00 p.m. may not be returned until the following business day.  We are closed weekends and major holidays. You have access to a nurse at all times for urgent questions. Please call the main number to the clinic Dept: [phone] and follow the prompts.   For any non-urgent questions, you may also contact your provider using MyChart. We now offer e-Visits for anyone 18 and older to request care online for non-urgent symptoms. For details visit [email or web address].   Also download the MyChart app! Go to the app store, search "MyChart", open the app, select , and log in with your MyChart username and password.   

## 2022-11-28 NOTE — Progress Notes (Signed)
[place] [phone]   [phone]  PROGRESS NOTE  Patient Care Team: [facility] as PCP - General (Rheumatology)  Hematological/Oncological History # IgG Lambda Multiple Myeloma  06/14/2022: establish care with [name] due to anemia. Labs showed M protein 4.9, Kappa 7.2, Lambda 10.8, ratio 0.67 07/13/2022: Bmbx showed Lambda restricted plasma cell neoplasm involving approximately 60% of the cellular marrow by IHC on the biopsy.  08/01/2022: Cycle 1 Day 1 of VRd chemotherapy. (Holding revlimid initially) 08/21/2022:  Cycle 2 Day 1 of VRd chemotherapy. (Holding revlimid) 09/04/2022: Cycle 3 Day 1 of VRd chemotherapy. Started revlimid 10/03/2022: Cycle 4 Day 1 of VRd chemotherapy 10/31/2022: Cycle 5 Day 1 of VRd chemotherapy 11/20/2022: Cycle 6 Day 1 of VRd chemotherapy  Interval History:  Zoe [name] 89 y.o. female with medical history significant for newly diagnosed multiple myeloma who presents for a follow up visit. The patient's last visit was on 11/14/2022. In the interim since the last visit she has continued VRd chemotherapy.   On exam today Zoe [name] reports she continues to struggle with pain in her back and her legs.  She reports that she woke up 8-10 times last night with back/leg pain.  She reports that she is trying to get a tooth extraction performed but is not sure which oral surgeon she would like to have do this procedure.  She notes she is not having any numbness or tingling of her fingers and toes.  She is tolerating the Velcade well otherwise with no nausea, vomiting, or diarrhea.  She is not having any lightheadedness, dizziness, or headaches.  The Revlimid pills are arriving on time and not causing any major side effects.  Overall she is at her baseline level of health and willing and able to proceed with treatment at this time.  She denies fevers, chills, sweats, shortness of breath, chest pain or cough.  She has no other complaints. Full 10 point ROS was otherwise negative.  MEDICAL HISTORY:  Past Medical History:  Diagnosis Date   Arthritis    some - per patient   Breast cancer ([place])    breast cancer / left    Cataract    bilat    GERD (gastroesophageal reflux disease)    History of kidney stones    Hyperlipidemia    Hypertension    Hypothyroidism    Macular degeneration    Left   S/P TAVR (transcatheter aortic valve replacement) 09/03/2018   23 mm Edwards Sapien 3 transcatheter heart valve placed via percutaneous right transfemoral approach    Severe aortic stenosis    Stress incontinence    Thyroid disease    Tinnitus     SURGICAL HISTORY: Past Surgical History:  Procedure Laterality Date   ABDOMINAL HYSTERECTOMY  1970's   BACK SURGERY     BREAST LUMPECTOMY  12/1998   lumpectomy   CARDIAC CATHETERIZATION     EYE SURGERY     cataract surgery bilat    INTRAOPERATIVE TRANSTHORACIC ECHOCARDIOGRAM N/A 09/03/2018   Procedure: INTRAOPERATIVE TRANSTHORACIC ECHOCARDIOGRAM;  Surgeon: Burnell Blanks, MD;  Location: [facility];  Service: Open Heart Surgery;  Laterality: N/A;   KYPHOPLASTY N/A 09/07/2022   Procedure: THORACIC EIGHT KYPHOPLASTY;  Surgeon: Melina Schools, MD;  Location: [facility];  Service: Orthopedics;  Laterality: N/A;  1 hr Local with [facility] 3 C-Bed   LITHOTRIPSY     Right total knee     2018 Dr. Alvan Dame   RIGHT/LEFT HEART CATH  AND CORONARY ANGIOGRAPHY N/A 08/06/2018   Procedure: RIGHT/LEFT HEART CATH AND CORONARY ANGIOGRAPHY;  Surgeon: Adrian Prows, MD;  Location: [facility];  Service: Cardiovascular;  Laterality: N/A;   THYROIDECTOMY, PARTIAL  1975   TONSILLECTOMY     as a child - patient not sure of exact date   TOTAL KNEE ARTHROPLASTY Left 03/13/2016   Procedure: TOTAL KNEE ARTHROPLASTY;  Surgeon: Paralee Cancel, MD;  Location: [facility];  Service: Orthopedics;  Laterality: Left;   TOTAL KNEE ARTHROPLASTY Right 06/18/2017   Procedure: RIGHT TOTAL KNEE  ARTHROPLASTY;  Surgeon: Paralee Cancel, MD;  Location: [facility];  Service: Orthopedics;  Laterality: Right;   TRANSCATHETER AORTIC VALVE REPLACEMENT, TRANSFEMORAL N/A 09/03/2018   Procedure: TRANSCATHETER AORTIC VALVE REPLACEMENT, TRANSFEMORAL;  Surgeon: Burnell Blanks, MD;  Location: [facility];  Service: Open Heart Surgery;  Laterality: N/A;    SOCIAL HISTORY: Social History   Socioeconomic History   Marital status: Widowed    Spouse name: Not on file   Number of children: 4   Years of education: Not on file   Highest education level: Master's degree (e.g., MA, MS, MEng, MEd, MSW, MBA)  Occupational History   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  Tobacco Use   Smoking status: Never   Smokeless tobacco: Never  Vaping Use   Vaping Use: Never used  Substance and Sexual Activity   Alcohol use: No   Drug use: No   Sexual activity: Not Currently  Other Topics Concern   Not on file  Social History Narrative   Not on file   Social Determinants of Health   Financial Resource Strain: Low Risk  (11/12/2018)   Overall Financial Resource Strain (CARDIA)    Difficulty of Paying Living Expenses: Not very hard  Food Insecurity: No Food Insecurity (11/12/2018)   Hunger Vital Sign    Worried About Running Out of Food in the Last Year: Never true    Ran Out of Food in the Last Year: Never true  Transportation Needs: No Transportation Needs (11/12/2018)   PRAPARE - Hydrologist (Medical): No    Lack of Transportation (Non-Medical): No  Physical Activity: Inactive (11/12/2018)   Exercise Vital Sign    Days of Exercise per Week: 0 days    Minutes of Exercise per Session: 0 min  Stress: Stress Concern Present (11/12/2018)   [place]    Feeling of Stress : To some extent  Social Connections: Not on file  Intimate Partner Violence: Not on file    FAMILY  HISTORY: Family History  Problem Relation Age of Onset   Diabetes Mother    Stroke Mother        Carotid artery disease   Heart disease Father        CAD   Coronary artery disease Father    Diabetes Sister     ALLERGIES:  is allergic to penicillins and sulfa antibiotics.  MEDICATIONS:  Current Outpatient Medications  Medication Sig Dispense Refill   acyclovir (ZOVIRAX) 400 MG tablet Take 400 mg by mouth 2 (two) times daily. Pt takes 1 tablet (400 mg total) by mouth 2 (two) times daily.     Artificial Tear Solution (SOOTHE XP) SOLN Place 1 drop into both eyes every evening.     aspirin EC 81 MG tablet Take 81 mg by mouth at bedtime. Swallow whole.     calcitonin, salmon, (MIACALCIN/FORTICAL) 200 UNIT/ACT nasal  spray Place 1 spray into alternate nostrils daily.     carvedilol (COREG) 12.5 MG tablet Take 12.5 mg by mouth 2 (two) times daily with a meal.     Cholecalciferol (VITAMIN D3) 50 MCG (2000 UT) capsule Take 1 capsule (2,000 Units total) by mouth daily.     cyanocobalamin (VITAMIN B12) 1000 MCG tablet Take 1,000 mcg by mouth daily.     dapagliflozin propanediol (FARXIGA) 10 MG TABS tablet TAKE 1 TABLET BY MOUTH EVERY DAY 90 tablet 2   dexamethasone (DECADRON) 4 MG tablet Take 5 tablets (20 mg total) by mouth once a week. Take 20 mg (5 tablets) on day of multiple myeloma treatment 20 tablet 5   esomeprazole (NEXIUM) 20 MG capsule Take 20 mg by mouth daily as needed (Heartburn).     furosemide (LASIX) 20 MG tablet PLEASE SEE ATTACHED FOR DETAILED DIRECTIONS (Patient taking differently: Take 20 mg by mouth daily.) 90 tablet 1   lenalidomide (REVLIMID) 25 MG capsule Take 1 capsule (25 mg total) by mouth daily. Take for 14 days, then none for 7 days. Repeat every 21 days. [employer] Auth # [id number] Date Obtained 11/27/2022 14 capsule 0   levothyroxine (SYNTHROID, LEVOTHROID) 100 MCG tablet Take 100 mcg by mouth daily before breakfast.  2   losartan (COZAAR) 25 MG tablet TAKE 1 TABLET (25 MG  TOTAL) BY MOUTH DAILY. 90 tablet 1   polyethylene glycol (MIRALAX / GLYCOLAX) 17 g packet Take 17 g by mouth daily as needed for mild constipation.     potassium chloride (KLOR-CON M10) 10 MEQ tablet TAKE 2 TABLETS BY MOUTH EVERY DAY AS NEEDED WITH FUROSEMIDE 180 tablet 1   pravastatin (PRAVACHOL) 40 MG tablet Take 40 mg by mouth every evening.     ferrous sulfate 325 (65 FE) MG tablet Take 325 mg by mouth daily with breakfast. (Patient not taking: Reported on 11/28/2022)     gabapentin (NEURONTIN) 300 MG capsule Take 1 capsule (300 mg total) by mouth 2 (two) times daily. 60 capsule 2   ondansetron (ZOFRAN) 8 MG tablet Take 1 tablet (8 mg total) by mouth every 8 (eight) hours as needed. (Patient not taking: Reported on 11/28/2022) 30 tablet 0   prochlorperazine (COMPAZINE) 10 MG tablet Take 1 tablet (10 mg total) by mouth every 6 (six) hours as needed for nausea or vomiting. (Patient not taking: Reported on 11/28/2022) 30 tablet 0   No current facility-administered medications for this visit.    REVIEW OF SYSTEMS:   Constitutional: ( - ) fevers, ( - )  chills , ( - ) night sweats Eyes: ( - ) blurriness of vision, ( - ) double vision, ( - ) watery eyes Ears, nose, mouth, throat, and face: ( - ) mucositis, ( - ) sore throat Respiratory: ( - ) cough, ( - ) dyspnea, ( - ) wheezes Cardiovascular: ( - ) palpitation, ( - ) chest discomfort, ( - ) lower extremity swelling Gastrointestinal:  ( - ) nausea, ( - ) heartburn, ( - ) change in bowel habits Skin: ( - ) abnormal skin rashes Lymphatics: ( - ) new lymphadenopathy, ( - ) easy bruising Neurological: ( - ) numbness, ( - ) tingling, ( - ) new weaknesses Behavioral/Psych: ( - ) mood change, ( - ) new changes  All other systems were reviewed with the patient and are negative.  PHYSICAL EXAMINATION: ECOG PERFORMANCE STATUS: 1 - Symptomatic but completely ambulatory  Vitals:   11/28/22 0912  BP: 139/73  Pulse: 67  Resp: 16  Temp: 97.9 F (36.6 C)   SpO2: 95%     Filed Weights   11/28/22 0912  Weight: 162 lb 12.8 oz (73.8 kg)      GENERAL: well appearing elderly Caucasian female, alert, no distress and comfortable SKIN: skin color, texture, turgor are normal, no rashes or significant lesions EYES: conjunctiva are pink and non-injected, sclera clear LUNGS: clear to auscultation and percussion with normal breathing effort HEART: regular rate & rhythm and no murmurs and no lower extremity edema Musculoskeletal: no cyanosis of digits and no clubbing  PSYCH: alert & oriented x 3, fluent speech NEURO: no focal motor/sensory deficits  LABORATORY DATA:  I have reviewed the data as listed    Latest Ref Rng & Units 11/28/2022    8:32 AM 11/20/2022    8:27 AM 11/14/2022    8:22 AM  CBC  WBC 4.0 - 10.5 K/uL 9.9  7.1  6.5   Hemoglobin 12.0 - 15.0 g/dL 12.6  13.0  12.4   Hematocrit 36.0 - 46.0 % 37.2  38.2  36.5   Platelets 150 - 400 K/uL 225  217  189        Latest Ref Rng & Units 11/28/2022    8:32 AM 11/20/2022    8:27 AM 11/14/2022    8:22 AM  CMP  Glucose 70 - 99 mg/dL 113  123  117   BUN 8 - 23 mg/dL '17  21  17   '$ Creatinine 0.44 - 1.00 mg/dL 0.59  0.72  0.64   Sodium 135 - 145 mmol/L 131  130  130   Potassium 3.5 - 5.1 mmol/L 3.9  4.1  4.0   Chloride 98 - 111 mmol/L 98  97  96   CO2 22 - 32 mmol/L '26  27  26   '$ Calcium 8.9 - 10.3 mg/dL 8.4  8.6  8.5   Total Protein 6.5 - 8.1 g/dL 5.8  6.3  6.0   Total Bilirubin 0.3 - 1.2 mg/dL 0.7  0.5  0.6   Alkaline Phos 38 - 126 U/L 65  70  72   AST 15 - 41 U/L '16  15  16   '$ ALT 0 - 44 U/L '10  14  19     '$ Lab Results  Component Value Date   MPROTEIN 1.3 (H) 10/31/2022   MPROTEIN 2.2 (H) 09/25/2022   MPROTEIN 3.3 (H) 08/28/2022   Lab Results  Component Value Date   KPAFRELGTCHN 11.9 10/31/2022   KPAFRELGTCHN 8.7 09/25/2022   KPAFRELGTCHN 4.3 08/28/2022   LAMBDASER 7.9 10/31/2022   LAMBDASER 6.1 09/25/2022   LAMBDASER 6.2 08/28/2022   KAPLAMBRATIO 1.51 10/31/2022    KAPLAMBRATIO 1.43 09/25/2022   KAPLAMBRATIO 0.69 08/28/2022    RADIOGRAPHIC STUDIES: No results found.  ASSESSMENT & PLAN Zoe [name] 89 y.o. female with medical history significant for multiple myeloma who presents for a follow up visit.   After review of the labs, review the records, discussion with the patient the findings are most consistent with an IgG lambda multiple myeloma.  The patient meets the diagnostic criteria based on 60% plasma cells in the bone marrow and anemia.  Additionally the patient was noted to have vitamin B12 deficiency which is currently being treated as well.  Given her degree of anemia would recommend proceeding with VD chemotherapy and will add Revlimid once hemoglobin improves.  The patient and her daughter voiced understanding of the plan moving forward.  # IgG  Lambda Multiple Myeloma  --diagnosis of MM confirmed with bone marrow biopsy showing 60% plasma cells and anemia -- Bone survey shows no lytic lesions, kidney function is within normal limits.  --08/01/2022 was Cycle 1 Day 1 of Vd  Plan:  --Today is Cycle 6 Day 8 of Vd chemotherapy.  --Labs show white blood cell count 9.9, hemoglobin 12.6, MCV 86.9, and platelets of 225 --last M protein trended down to 1.3 (4.9 prior to start of therapy). Normalized SFLC.  --at each visit will collect CBC, CMP, and LDH with monthly restaging labs SPEP and SFLC.  --RTC in 2 weeks with interval weekly treatment.   # Normocytic Anemia #Vitamin B12 Deficiency --Anemia likely driven by multiple myeloma, but may be component of vitamin B12 deficiency as well. --initial labs showed elevated MMA with B12 180 -- Continue vitamin B12 1000 mcg p.o. daily -- Continue to monitor  #Left leg/thigh neuropathy: --Currently on Lyrica 25 mg PO In the AM and 50 mg PO in the PM. Minimal relief. Recommend to try Lyrica 50 mg BID --Encouraged to try water aerobics and stretches.  --Following with Dr. Dietrich Pates (ortho) to see if  she is a candidate for steroid injections.  --Sent referral to Dr. Mickeal Skinner for additional recommendations. Scheduled to see him later today.   #Supportive Care -- chemotherapy education complete -- port placement not required.  --Awaiting to start Zometa/Xgeva  -- zofran '8mg'$  q8H PRN and compazine '10mg'$  PO q6H for nausea -- acyclovir '400mg'$  PO BID for VCZ prophylaxis -- tylenol 1000 mg q8H PRN for back pain.   No orders of the defined types were placed in this encounter.   All questions were answered. The patient knows to call the clinic with any problems, questions or concerns.  A total of more than 30 minutes were spent on this encounter with face-to-face time and non-face-to-face time, including preparing to see the patient, ordering tests and/or medications, counseling the patient and coordination of care as outlined above.   Ledell Peoples, MD Department of Hematology/Oncology [place] [facility] Phone: [phone] Pager: [phone] Email: [email or web address]    11/28/2022 3:31 PM

## 2022-11-28 NOTE — Progress Notes (Signed)
[facility] [place], [place] [address] [phone]   New Patient Evaluation  Date of Service: 11/28/22 Patient Name: Zoe [name] Patient MRN: [id number] Patient DOB: 1934/09/21 Provider: Ventura Sellers, MD  Identifying Statement:  Zoe [name] is a 89 y.o. female with Chemotherapy-induced neuropathy [facility]) who presents for initial consultation and evaluation regarding cancer associated neurologic deficits.    Referring Provider: [facility], [facility] [address] [place],  [place] [address]  Primary Cancer:  Oncologic History: Oncology History  Multiple myeloma not having achieved remission ([place])  07/24/2022 Initial Diagnosis   Multiple myeloma not having achieved remission ([place])   08/01/2022 -  Chemotherapy   Patient is on Treatment Plan : MYELOMA NON-TRANSPLANT CANDIDATES VRd weekly q21d       History of Present Illness: The patient's records from the referring physician were obtained and reviewed and the patient interviewed to confirm this HPI.  Zoe [name] presents to review neuropathy symptoms.  She describes burning type pain in her left leg, primarily.  There is also some numbness in her feet.  Onset was several months ago, since chemotherapy was started.  She has been walking independently, typically with a cane for chronic back issues.  She has been dosing lyrica '100mg'$  BID, but this has been ineffective.  Denies headaches, seizures.  Continues on velcade with Dr. Lorenso Courier for myeloma.      Medications: Current Outpatient Medications on File Prior to Visit  Medication Sig Dispense Refill   acyclovir (ZOVIRAX) 400 MG tablet Take 400 mg by mouth 2 (two) times daily. Pt takes 1 tablet (400 mg total) by mouth 2 (two) times daily.     Artificial Tear Solution (SOOTHE XP) SOLN Place 1 drop into both eyes every evening.     aspirin EC 81 MG tablet Take 81 mg by mouth at bedtime. Swallow  whole.     calcitonin, salmon, (MIACALCIN/FORTICAL) 200 UNIT/ACT nasal spray Place 1 spray into alternate nostrils daily.     carvedilol (COREG) 12.5 MG tablet Take 12.5 mg by mouth 2 (two) times daily with a meal.     Cholecalciferol (VITAMIN D3) 50 MCG (2000 UT) capsule Take 1 capsule (2,000 Units total) by mouth daily.     cyanocobalamin (VITAMIN B12) 1000 MCG tablet Take 1,000 mcg by mouth daily.     dapagliflozin propanediol (FARXIGA) 10 MG TABS tablet TAKE 1 TABLET BY MOUTH EVERY DAY 90 tablet 2   dexamethasone (DECADRON) 4 MG tablet Take 5 tablets (20 mg total) by mouth once a week. Take 20 mg (5 tablets) on day of multiple myeloma treatment 20 tablet 5   esomeprazole (NEXIUM) 20 MG capsule Take 20 mg by mouth daily as needed (Heartburn).     ferrous sulfate 325 (65 FE) MG tablet Take 325 mg by mouth daily with breakfast. (Patient not taking: Reported on 11/28/2022)     furosemide (LASIX) 20 MG tablet PLEASE SEE ATTACHED FOR DETAILED DIRECTIONS (Patient taking differently: Take 20 mg by mouth daily.) 90 tablet 1   lenalidomide (REVLIMID) 25 MG capsule Take 1 capsule (25 mg total) by mouth daily. Take for 14 days, then none for 7 days. Repeat every 21 days. [employer] Auth # [id number] Date Obtained 11/27/2022 14 capsule 0   levothyroxine (SYNTHROID, LEVOTHROID) 100 MCG tablet Take 100 mcg by mouth daily before breakfast.  2   losartan (COZAAR) 25 MG tablet TAKE 1 TABLET (25 MG TOTAL) BY MOUTH DAILY. [place]  tablet 1   ondansetron (ZOFRAN) 8 MG tablet Take 1 tablet (8 mg total) by mouth every 8 (eight) hours as needed. (Patient not taking: Reported on 11/28/2022) 30 tablet 0   polyethylene glycol (MIRALAX / GLYCOLAX) 17 g packet Take 17 g by mouth daily as needed for mild constipation.     potassium chloride (KLOR-CON M10) 10 MEQ tablet TAKE 2 TABLETS BY MOUTH EVERY DAY AS NEEDED WITH FUROSEMIDE 180 tablet 1   pravastatin (PRAVACHOL) 40 MG tablet Take 40 mg by mouth every evening.     prochlorperazine  (COMPAZINE) 10 MG tablet Take 1 tablet (10 mg total) by mouth every 6 (six) hours as needed for nausea or vomiting. (Patient not taking: Reported on 11/28/2022) 30 tablet 0   No current facility-administered medications on file prior to visit.    Allergies:  Allergies  Allergen Reactions   Penicillins Other (See Comments)    UNSPECIFIED REACTION  Patient does not remember reaction.  Has patient had a PCN reaction causing immediate rash, facial/tongue/throat swelling, SOB or lightheadedness with hypotension: no Has patient had a PCN reaction causing severe rash involving mucus membranes or skin necrosis: no Has patient had a PCN reaction that required hospitalization no Has patient had a PCN reaction occurring within the last 10 years: no If all of the above answers are "NO", then may proceed with Cephalosporin use.    Sulfa Antibiotics Other (See Comments)    UNSPECIFIED REACTION  "maybe vision issues? "   Past Medical History:  Past Medical History:  Diagnosis Date   Arthritis    some - per patient   Breast cancer ([place])    breast cancer / left    Cataract    bilat    GERD (gastroesophageal reflux disease)    History of kidney stones    Hyperlipidemia    Hypertension    Hypothyroidism    Macular degeneration    Left   S/P TAVR (transcatheter aortic valve replacement) 09/03/2018   23 mm Edwards Sapien 3 transcatheter heart valve placed via percutaneous right transfemoral approach    Severe aortic stenosis    Stress incontinence    Thyroid disease    Tinnitus    Past Surgical History:  Past Surgical History:  Procedure Laterality Date   ABDOMINAL HYSTERECTOMY  1970's   BACK SURGERY     BREAST LUMPECTOMY  12/1998   lumpectomy   CARDIAC CATHETERIZATION     EYE SURGERY     cataract surgery bilat    INTRAOPERATIVE TRANSTHORACIC ECHOCARDIOGRAM N/A 09/03/2018   Procedure: INTRAOPERATIVE TRANSTHORACIC ECHOCARDIOGRAM;  Surgeon: Burnell Blanks, MD;  Location: [facility];   Service: Open Heart Surgery;  Laterality: N/A;   KYPHOPLASTY N/A 09/07/2022   Procedure: THORACIC EIGHT KYPHOPLASTY;  Surgeon: Melina Schools, MD;  Location: [facility];  Service: Orthopedics;  Laterality: N/A;  1 hr Local with [facility] 3 C-Bed   LITHOTRIPSY     Right total knee     2018 Dr. Alvan Dame   RIGHT/LEFT HEART CATH AND CORONARY ANGIOGRAPHY N/A 08/06/2018   Procedure: RIGHT/LEFT HEART CATH AND CORONARY ANGIOGRAPHY;  Surgeon: Adrian Prows, MD;  Location: [facility];  Service: Cardiovascular;  Laterality: N/A;   THYROIDECTOMY, PARTIAL  1975   TONSILLECTOMY     as a child - patient not sure of exact date   TOTAL KNEE ARTHROPLASTY Left 03/13/2016   Procedure: TOTAL KNEE ARTHROPLASTY;  Surgeon: Paralee Cancel, MD;  Location: [facility];  Service: Orthopedics;  Laterality:  Left;   TOTAL KNEE ARTHROPLASTY Right 06/18/2017   Procedure: RIGHT TOTAL KNEE ARTHROPLASTY;  Surgeon: Paralee Cancel, MD;  Location: [facility];  Service: Orthopedics;  Laterality: Right;   TRANSCATHETER AORTIC VALVE REPLACEMENT, TRANSFEMORAL N/A 09/03/2018   Procedure: TRANSCATHETER AORTIC VALVE REPLACEMENT, TRANSFEMORAL;  Surgeon: Burnell Blanks, MD;  Location: [facility];  Service: Open Heart Surgery;  Laterality: N/A;   Social History:  Social History   Socioeconomic History   Marital status: Widowed    Spouse name: Not on file   Number of children: 4   Years of education: Not on file   Highest education level: Master's degree (e.g., MA, MS, MEng, MEd, MSW, MBA)  Occupational History   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  Tobacco Use   Smoking status: Never   Smokeless tobacco: Never  Vaping Use   Vaping Use: Never used  Substance and Sexual Activity   Alcohol use: No   Drug use: No   Sexual activity: Not Currently  Other Topics Concern   Not on file  Social History Narrative   Not on file   Social Determinants of Health   Financial Resource Strain: Low Risk  (11/12/2018)    Overall Financial Resource Strain (CARDIA)    Difficulty of Paying Living Expenses: Not very hard  Food Insecurity: No Food Insecurity (11/12/2018)   Hunger Vital Sign    Worried About Running Out of Food in the Last Year: Never true    Ran Out of Food in the Last Year: Never true  Transportation Needs: No Transportation Needs (11/12/2018)   PRAPARE - Hydrologist (Medical): No    Lack of Transportation (Non-Medical): No  Physical Activity: Inactive (11/12/2018)   Exercise Vital Sign    Days of Exercise per Week: 0 days    Minutes of Exercise per Session: 0 min  Stress: Stress Concern Present (11/12/2018)   [place]    Feeling of Stress : To some extent  Social Connections: Not on file  Intimate Partner Violence: Not on file   Family History:  Family History  Problem Relation Age of Onset   Diabetes Mother    Stroke Mother        Carotid artery disease   Heart disease Father        CAD   Coronary artery disease Father    Diabetes Sister     Review of Systems: Constitutional: Doesn't report fevers, chills or abnormal weight loss Eyes: Doesn't report blurriness of vision Ears, nose, mouth, throat, and face: Doesn't report sore throat Respiratory: Doesn't report cough, dyspnea or wheezes Cardiovascular: Doesn't report palpitation, chest discomfort  Gastrointestinal:  Doesn't report nausea, constipation, diarrhea GU: Doesn't report incontinence Skin: Doesn't report skin rashes Neurological: Per HPI Musculoskeletal: Doesn't report joint pain Behavioral/Psych: Doesn't report anxiety  Physical Exam: Wt Readings from Last 3 Encounters:  11/28/22 162 lb 12.8 oz (73.8 kg)  11/20/22 158 lb 12.8 oz (72 kg)  11/14/22 168 lb (76.2 kg)   Temp Readings from Last 3 Encounters:  11/28/22 97.9 F (36.6 C) (Oral)  11/20/22 98.4 F (36.9 C) (Oral)  11/14/22 97.8 F (36.6 C) (Temporal)    BP Readings from Last 3 Encounters:  11/28/22 139/73  11/20/22 125/67  11/14/22 (!) 145/69   Pulse Readings from Last 3 Encounters:  11/28/22 67  11/20/22 75  11/14/22 70   KPS: 80. General: Alert, cooperative, pleasant, in no acute  distress Head: Normal EENT: No conjunctival injection or scleral icterus.  Lungs: Resp effort normal Cardiac: Regular rate Abdomen: Non-distended abdomen Skin: No rashes cyanosis or petechiae. Extremities: No clubbing or edema  Neurologic Exam: Mental Status: Awake, alert, attentive to examiner. Oriented to self and environment. Language is fluent with intact comprehension.  Cranial Nerves: Visual acuity is grossly normal. Visual fields are full. Extra-ocular movements intact. No ptosis. Face is symmetric Motor: Tone and bulk are normal. Power is full in both arms and legs. Reflexes are symmetric, no pathologic reflexes present.  Sensory: Intact to light touch Gait: Cane assisted   Labs: I have reviewed the data as listed    Component Value Date/Time   NA 131 (L) 11/28/2022 0832   NA 130 (L) 06/19/2022 1012   K 3.9 11/28/2022 0832   CL 98 11/28/2022 0832   CO2 26 11/28/2022 0832   GLUCOSE 113 (H) 11/28/2022 0832   BUN 17 11/28/2022 0832   BUN 21 06/19/2022 1012   CREATININE 0.59 11/28/2022 0832   CALCIUM 8.4 (L) 11/28/2022 0832   PROT 5.8 (L) 11/28/2022 0832   PROT 9.2 (H) 07/16/2019 0941   ALBUMIN 3.2 (L) 11/28/2022 0832   ALBUMIN 2.9 (L) 07/16/2019 0941   AST 16 11/28/2022 0832   ALT 10 11/28/2022 0832   ALKPHOS 65 11/28/2022 0832   BILITOT 0.7 11/28/2022 0832   GFRNONAA >60 11/28/2022 0832   GFRAA 78 07/16/2019 0941   Lab Results  Component Value Date   WBC 9.9 11/28/2022   NEUTROABS 8.5 (H) 11/28/2022   HGB 12.6 11/28/2022   HCT 37.2 11/28/2022   MCV 86.9 11/28/2022   PLT 225 11/28/2022     Assessment/Plan Chemotherapy-induced neuropathy ([place])  Zoe [name] presents with clinical syndrome consistent with  symmetric, length dependent, small and large fiber peripheral neuropathy.  Etiology is exposure to velcade chemotherapy.  There is likely an underlying radiculopathy component as well given her history and prior spine imaging.  We reviewed pathophysiology of chemotherapy induced neuropathy, available treatments, and goals of care.  She was agreeable to trial of gabapentin given lack of efficacy of lyrica.  Will start with '300mg'$  BID and transition to '600mg'$  BID after 2 weeks if ineffective.   We spent twenty additional minutes teaching regarding the natural history, biology, and historical experience in the treatment of neurologic complications of cancer.   We appreciate the opportunity to participate in the care of [place].  She will return to clinic in ~1 month or sooner if needed.  All questions were answered. The patient knows to call the clinic with any problems, questions or concerns. No barriers to learning were detected.  The total time spent in the encounter was 40 minutes and more than 50% was on counseling and review of test results   Ventura Sellers, MD Medical Director of Neuro-Oncology [facility] at [place] 11/28/22 3:33 PM

## 2022-11-29 LAB — KAPPA/LAMBDA LIGHT CHAINS
Kappa free light chain: 10.3 mg/L (ref 3.3–19.4)
Kappa, lambda light chain ratio: 1.75 — ABNORMAL HIGH (ref 0.26–1.65)
Lambda free light chains: 5.9 mg/L (ref 5.7–26.3)

## 2022-12-01 ENCOUNTER — Telehealth: Payer: Self-pay

## 2022-12-01 DIAGNOSIS — M6281 Muscle weakness (generalized): Secondary | ICD-10-CM | POA: Diagnosis not present

## 2022-12-01 DIAGNOSIS — M546 Pain in thoracic spine: Secondary | ICD-10-CM | POA: Diagnosis not present

## 2022-12-01 DIAGNOSIS — R2689 Other abnormalities of gait and mobility: Secondary | ICD-10-CM | POA: Diagnosis not present

## 2022-12-01 DIAGNOSIS — M545 Low back pain, unspecified: Secondary | ICD-10-CM | POA: Diagnosis not present

## 2022-12-01 NOTE — Telephone Encounter (Signed)
Letter faxed to [facility] approving toothe extraction per Dr. Lorenso Courier.

## 2022-12-04 ENCOUNTER — Inpatient Hospital Stay: Payer: Medicare Other

## 2022-12-04 ENCOUNTER — Other Ambulatory Visit: Payer: Self-pay

## 2022-12-04 VITALS — BP 155/67 | HR 69 | Temp 97.6°F | Resp 18 | Wt 162.8 lb

## 2022-12-04 DIAGNOSIS — C9000 Multiple myeloma not having achieved remission: Secondary | ICD-10-CM

## 2022-12-04 DIAGNOSIS — G620 Drug-induced polyneuropathy: Secondary | ICD-10-CM | POA: Diagnosis not present

## 2022-12-04 DIAGNOSIS — Z5112 Encounter for antineoplastic immunotherapy: Secondary | ICD-10-CM | POA: Diagnosis not present

## 2022-12-04 DIAGNOSIS — D649 Anemia, unspecified: Secondary | ICD-10-CM | POA: Diagnosis not present

## 2022-12-04 DIAGNOSIS — E538 Deficiency of other specified B group vitamins: Secondary | ICD-10-CM | POA: Diagnosis not present

## 2022-12-04 LAB — CMP (CANCER CENTER ONLY)
ALT: 9 U/L (ref 0–44)
AST: 13 U/L — ABNORMAL LOW (ref 15–41)
Albumin: 3.3 g/dL — ABNORMAL LOW (ref 3.5–5.0)
Alkaline Phosphatase: 62 U/L (ref 38–126)
Anion gap: 5 (ref 5–15)
BUN: 18 mg/dL (ref 8–23)
CO2: 29 mmol/L (ref 22–32)
Calcium: 8.5 mg/dL — ABNORMAL LOW (ref 8.9–10.3)
Chloride: 99 mmol/L (ref 98–111)
Creatinine: 0.59 mg/dL (ref 0.44–1.00)
GFR, Estimated: >60 mL/min (ref >60)
Glucose, Bld: 99 mg/dL (ref 70–99)
Potassium: 3.9 mmol/L (ref 3.5–5.1)
Sodium: 133 mmol/L — ABNORMAL LOW (ref 135–145)
Total Bilirubin: 0.7 mg/dL (ref 0.3–1.2)
Total Protein: 5.9 g/dL — ABNORMAL LOW (ref 6.5–8.1)

## 2022-12-04 LAB — CBC WITH DIFFERENTIAL (CANCER CENTER ONLY)
Abs Immature Granulocytes: 0.04 10*3/uL (ref 0.00–0.07)
Basophils Absolute: 0.1 10*3/uL (ref 0.0–0.1)
Basophils Relative: 1 %
Eosinophils Absolute: 0.3 10*3/uL (ref 0.0–0.5)
Eosinophils Relative: 4 %
HCT: 37.9 % (ref 36.0–46.0)
Hemoglobin: 12.6 g/dL (ref 12.0–15.0)
Immature Granulocytes: 1 %
Lymphocytes Relative: 9 %
Lymphs Abs: 0.7 10*3/uL (ref 0.7–4.0)
MCH: 29.1 pg (ref 26.0–34.0)
MCHC: 33.2 g/dL (ref 30.0–36.0)
MCV: 87.5 fL (ref 80.0–100.0)
Monocytes Absolute: 1.5 10*3/uL — ABNORMAL HIGH (ref 0.1–1.0)
Monocytes Relative: 17 %
Neutro Abs: 5.8 10*3/uL (ref 1.7–7.7)
Neutrophils Relative %: 68 %
Platelet Count: 196 10*3/uL (ref 150–400)
RBC: 4.33 MIL/uL (ref 3.87–5.11)
RDW: 16.6 % — ABNORMAL HIGH (ref 11.5–15.5)
WBC Count: 8.4 10*3/uL (ref 4.0–10.5)
nRBC: 0 % (ref 0.0–0.2)

## 2022-12-04 LAB — MULTIPLE MYELOMA PANEL, SERUM
Albumin SerPl Elph-Mcnc: 3.3 g/dL (ref 2.9–4.4)
Albumin/Glob SerPl: 1.2 (ref 0.7–1.7)
Alpha 1: 0.2 g/dL (ref 0.0–0.4)
Alpha2 Glob SerPl Elph-Mcnc: 0.6 g/dL (ref 0.4–1.0)
B-Globulin SerPl Elph-Mcnc: 0.7 g/dL (ref 0.7–1.3)
Gamma Glob SerPl Elph-Mcnc: 1.3 g/dL (ref 0.4–1.8)
Globulin, Total: 2.8 g/dL (ref 2.2–3.9)
IgA: 36 mg/dL — ABNORMAL LOW (ref 64–422)
IgG (Immunoglobin G), Serum: 1259 mg/dL (ref 586–1602)
IgM (Immunoglobulin M), Srm: 24 mg/dL — ABNORMAL LOW (ref 26–217)
M Protein SerPl Elph-Mcnc: 1 g/dL — ABNORMAL HIGH
Total Protein ELP: 6.1 g/dL (ref 6.0–8.5)

## 2022-12-04 MED ORDER — BORTEZOMIB CHEMO SQ INJECTION 3.5 MG (2.5MG/ML)
1.3000 mg/m2 | Freq: Once | INTRAMUSCULAR | Status: AC
  Administered 2022-12-04: 2.25 mg via SUBCUTANEOUS
  Filled 2022-12-04: qty 0.9

## 2022-12-04 NOTE — Progress Notes (Signed)
Pt states she took decadron at home prior to arriving to infusion.

## 2022-12-04 NOTE — Patient Instructions (Signed)
[facility]  Discharge Instructions: Thank you for choosing [facility] to provide your oncology and hematology care.   If you have a lab appointment with the [facility], please go directly to the [facility] and check in at the registration area.   Wear comfortable clothing and clothing appropriate for easy access to any Portacath or PICC line.   We strive to give you quality time with your provider. You may need to reschedule your appointment if you arrive late (15 or more minutes).  Arriving late affects you and other patients whose appointments are after yours.  Also, if you miss three or more appointments without notifying the office, you may be dismissed from the clinic at the provider's discretion.      For prescription refill requests, have your pharmacy contact our office and allow 72 hours for refills to be completed.    Today you received the following chemotherapy and/or immunotherapy agents: Velcade.      To help prevent nausea and vomiting after your treatment, we encourage you to take your nausea medication as directed.  BELOW ARE SYMPTOMS THAT SHOULD BE REPORTED IMMEDIATELY: *FEVER GREATER THAN 100.4 F (38 C) OR HIGHER *CHILLS OR SWEATING *NAUSEA AND VOMITING THAT IS NOT CONTROLLED WITH YOUR NAUSEA MEDICATION *UNUSUAL SHORTNESS OF BREATH *UNUSUAL BRUISING OR BLEEDING *URINARY PROBLEMS (pain or burning when urinating, or frequent urination) *BOWEL PROBLEMS (unusual diarrhea, constipation, pain near the anus) TENDERNESS IN MOUTH AND THROAT WITH OR WITHOUT PRESENCE OF ULCERS (sore throat, sores in mouth, or a toothache) UNUSUAL RASH, SWELLING OR PAIN  UNUSUAL VAGINAL DISCHARGE OR ITCHING   Items with * indicate a potential emergency and should be followed up as soon as possible or go to the Emergency Department if any problems should occur.  Please show the CHEMOTHERAPY ALERT CARD or IMMUNOTHERAPY ALERT CARD at  check-in to the Emergency Department and triage nurse.  Should you have questions after your visit or need to cancel or reschedule your appointment, please contact [facility]  Dept: [phone]  and follow the prompts.  Office hours are 8:00 a.m. to 4:30 p.m. Monday - Friday. Please note that voicemails left after 4:00 p.m. may not be returned until the following business day.  We are closed weekends and major holidays. You have access to a nurse at all times for urgent questions. Please call the main number to the clinic Dept: [phone] and follow the prompts.   For any non-urgent questions, you may also contact your provider using MyChart. We now offer e-Visits for anyone 30 and older to request care online for non-urgent symptoms. For details visit [email or web address].   Also download the MyChart app! Go to the app store, search "MyChart", open the app, select [facility], and log in with your MyChart username and password.

## 2022-12-05 ENCOUNTER — Other Ambulatory Visit: Payer: Medicare Other

## 2022-12-05 ENCOUNTER — Ambulatory Visit: Payer: Medicare Other

## 2022-12-07 ENCOUNTER — Telehealth: Payer: Self-pay

## 2022-12-07 NOTE — Telephone Encounter (Signed)
Called patient about patient assistance. Her VM was not set up for messages. She dropped off patient assistance with just the form, no income information. We need this information before we can fax it to the compant.

## 2022-12-08 DIAGNOSIS — R2689 Other abnormalities of gait and mobility: Secondary | ICD-10-CM | POA: Diagnosis not present

## 2022-12-08 DIAGNOSIS — M545 Low back pain, unspecified: Secondary | ICD-10-CM | POA: Diagnosis not present

## 2022-12-08 DIAGNOSIS — M546 Pain in thoracic spine: Secondary | ICD-10-CM | POA: Diagnosis not present

## 2022-12-08 DIAGNOSIS — M6281 Muscle weakness (generalized): Secondary | ICD-10-CM | POA: Diagnosis not present

## 2022-12-10 NOTE — Progress Notes (Unsigned)
[facility] [phone]   Fax:([phone]) [phone]  PROGRESS NOTE  Patient Care Team: [facility] as PCP - General (Rheumatology)  Hematological/Oncological History # IgG Lambda Multiple Myeloma  06/14/2022: establish care with [name] due to anemia. Labs showed M protein 4.9, Kappa 7.2, Lambda 10.8, ratio 0.67 07/13/2022: Bmbx showed Lambda restricted plasma cell neoplasm involving approximately 60% of the cellular marrow by IHC on the biopsy.  08/01/2022: Cycle 1 Day 1 of VRd chemotherapy. (Holding revlimid initially) 08/21/2022:  Cycle 2 Day 1 of VRd chemotherapy. (Holding revlimid) 09/04/2022: Cycle 3 Day 1 of VRd chemotherapy. Started revlimid 10/03/2022: Cycle 4 Day 1 of VRd chemotherapy 10/31/2022: Cycle 5 Day 1 of VRd chemotherapy 11/20/2022: Cycle 6 Day 1 of VRd chemotherapy 12/11/2022: Cycle 7 Day 1 of VRd chemotherapy  Interval History:  Zoe [name] 89 y.o. female with medical history significant for newly diagnosed multiple myeloma who presents for a follow up visit. The patient's last visit was on 11/28/2022. In the interim since the last visit she has continued VRd chemotherapy.   On exam today [name] reports her back pain is approximately a 6 out of 10.  She reports that she has to lie down about 3-4 times a day on a heating pad due to her back pain.  She reports that she has had some success with gabapentin.  She is taking 1 in the morning and 2 of these at night.  She did also recently have a tooth removed and did not have as much pain.  She took Tylenol for this and it was effective.  She reports that she is also continues take a baby aspirin 81 mg p.o. daily with no bleeding, bruising, or dark stools.  She continues to work with physical therapy.  She is most concerned that she is not as steady on her feet as she would like to be.  Otherwise she is tolerating her chemotherapy treatment well without any difficulty.  She  denies fevers, chills, sweats, shortness of breath, chest pain or cough. She has no other complaints. Full 10 point ROS was otherwise negative.  MEDICAL HISTORY:  Past Medical History:  Diagnosis Date   Arthritis    some - per patient   Breast cancer ([place])    breast cancer / left    Cataract    bilat    GERD (gastroesophageal reflux disease)    History of kidney stones    Hyperlipidemia    Hypertension    Hypothyroidism    Macular degeneration    Left   S/P TAVR (transcatheter aortic valve replacement) 09/03/2018   23 mm Edwards Sapien 3 transcatheter heart valve placed via percutaneous right transfemoral approach    Severe aortic stenosis    Stress incontinence    Thyroid disease    Tinnitus     SURGICAL HISTORY: Past Surgical History:  Procedure Laterality Date   ABDOMINAL HYSTERECTOMY  1970's   BACK SURGERY     BREAST LUMPECTOMY  12/1998   lumpectomy   CARDIAC CATHETERIZATION     EYE SURGERY     cataract surgery bilat    INTRAOPERATIVE TRANSTHORACIC ECHOCARDIOGRAM N/A 09/03/2018   Procedure: INTRAOPERATIVE TRANSTHORACIC ECHOCARDIOGRAM;  Surgeon: Burnell Blanks, MD;  Location: [facility];  Service: Open Heart Surgery;  Laterality: N/A;   KYPHOPLASTY N/A 09/07/2022   Procedure: THORACIC EIGHT KYPHOPLASTY;  Surgeon: Melina Schools, MD;  Location: [facility];  Service: Orthopedics;  Laterality: N/A;  1 hr Local with IV  Regional 3 C-Bed   LITHOTRIPSY     Right total knee     2018 Dr. Alvan Dame   RIGHT/LEFT HEART CATH AND CORONARY ANGIOGRAPHY N/A 08/06/2018   Procedure: RIGHT/LEFT HEART CATH AND CORONARY ANGIOGRAPHY;  Surgeon: Adrian Prows, MD;  Location: [facility];  Service: Cardiovascular;  Laterality: N/A;   THYROIDECTOMY, PARTIAL  1975   TONSILLECTOMY     as a child - patient not sure of exact date   TOTAL KNEE ARTHROPLASTY Left 03/13/2016   Procedure: TOTAL KNEE ARTHROPLASTY;  Surgeon: Paralee Cancel, MD;  Location: [facility];  Service: Orthopedics;  Laterality: Left;    TOTAL KNEE ARTHROPLASTY Right 06/18/2017   Procedure: RIGHT TOTAL KNEE ARTHROPLASTY;  Surgeon: Paralee Cancel, MD;  Location: [facility];  Service: Orthopedics;  Laterality: Right;   TRANSCATHETER AORTIC VALVE REPLACEMENT, TRANSFEMORAL N/A 09/03/2018   Procedure: TRANSCATHETER AORTIC VALVE REPLACEMENT, TRANSFEMORAL;  Surgeon: Burnell Blanks, MD;  Location: [facility];  Service: Open Heart Surgery;  Laterality: N/A;    SOCIAL HISTORY: Social History   Socioeconomic History   Marital status: Widowed    Spouse name: Not on file   Number of children: 4   Years of education: Not on file   Highest education level: Master's degree (e.g., MA, MS, MEng, MEd, MSW, MBA)  Occupational History   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  Tobacco Use   Smoking status: Never   Smokeless tobacco: Never  Vaping Use   Vaping Use: Never used  Substance and Sexual Activity   Alcohol use: No   Drug use: No   Sexual activity: Not Currently  Other Topics Concern   Not on file  Social History Narrative   Not on file   Social Determinants of Health   Financial Resource Strain: Low Risk  (11/12/2018)   Overall Financial Resource Strain (CARDIA)    Difficulty of Paying Living Expenses: Not very hard  Food Insecurity: No Food Insecurity (11/12/2018)   Hunger Vital Sign    Worried About Running Out of Food in the Last Year: Never true    Ran Out of Food in the Last Year: Never true  Transportation Needs: No Transportation Needs (11/12/2018)   PRAPARE - Hydrologist (Medical): No    Lack of Transportation (Non-Medical): No  Physical Activity: Inactive (11/12/2018)   Exercise Vital Sign    Days of Exercise per Week: 0 days    Minutes of Exercise per Session: 0 min  Stress: Stress Concern Present (11/12/2018)   [place]    Feeling of Stress : To some extent  Social Connections: Not  on file  Intimate Partner Violence: Not on file    FAMILY HISTORY: Family History  Problem Relation Age of Onset   Diabetes Mother    Stroke Mother        Carotid artery disease   Heart disease Father        CAD   Coronary artery disease Father    Diabetes Sister     ALLERGIES:  is allergic to penicillins and sulfa antibiotics.  MEDICATIONS:  Current Outpatient Medications  Medication Sig Dispense Refill   acyclovir (ZOVIRAX) 400 MG tablet Take 400 mg by mouth 2 (two) times daily. Pt takes 1 tablet (400 mg total) by mouth 2 (two) times daily.     Artificial Tear Solution (SOOTHE XP) SOLN Place 1 drop into both eyes every evening.  aspirin EC 81 MG tablet Take 81 mg by mouth at bedtime. Swallow whole.     calcitonin, salmon, (MIACALCIN/FORTICAL) 200 UNIT/ACT nasal spray Place 1 spray into alternate nostrils daily.     carvedilol (COREG) 12.5 MG tablet Take 12.5 mg by mouth 2 (two) times daily with a meal.     Cholecalciferol (VITAMIN D3) 50 MCG (2000 UT) capsule Take 1 capsule (2,000 Units total) by mouth daily.     cyanocobalamin (VITAMIN B12) 1000 MCG tablet Take 1,000 mcg by mouth daily.     dapagliflozin propanediol (FARXIGA) 10 MG TABS tablet TAKE 1 TABLET BY MOUTH EVERY DAY 90 tablet 2   dexamethasone (DECADRON) 4 MG tablet Take 5 tablets (20 mg total) by mouth once a week. Take 20 mg (5 tablets) on day of multiple myeloma treatment 20 tablet 5   esomeprazole (NEXIUM) 20 MG capsule Take 20 mg by mouth daily as needed (Heartburn).     furosemide (LASIX) 20 MG tablet PLEASE SEE ATTACHED FOR DETAILED DIRECTIONS (Patient taking differently: Take 20 mg by mouth daily.) 90 tablet 1   gabapentin (NEURONTIN) 300 MG capsule Take 1 capsule (300 mg total) by mouth 2 (two) times daily. 60 capsule 2   lenalidomide (REVLIMID) 25 MG capsule Take 1 capsule (25 mg total) by mouth daily. Take for 14 days, then none for 7 days. Repeat every 21 days. [employer] Auth # [id number] Date Obtained  11/27/2022 14 capsule 0   levothyroxine (SYNTHROID, LEVOTHROID) 100 MCG tablet Take 100 mcg by mouth daily before breakfast.  2   losartan (COZAAR) 25 MG tablet TAKE 1 TABLET (25 MG TOTAL) BY MOUTH DAILY. 90 tablet 1   polyethylene glycol (MIRALAX / GLYCOLAX) 17 g packet Take 17 g by mouth daily as needed for mild constipation.     potassium chloride (KLOR-CON M10) 10 MEQ tablet TAKE 2 TABLETS BY MOUTH EVERY DAY AS NEEDED WITH FUROSEMIDE 180 tablet 1   pravastatin (PRAVACHOL) 40 MG tablet Take 40 mg by mouth every evening.     ferrous sulfate 325 (65 FE) MG tablet Take 325 mg by mouth daily with breakfast. (Patient not taking: Reported on 11/28/2022)     ondansetron (ZOFRAN) 8 MG tablet Take 1 tablet (8 mg total) by mouth every 8 (eight) hours as needed. (Patient not taking: Reported on 11/28/2022) 30 tablet 0   prochlorperazine (COMPAZINE) 10 MG tablet Take 1 tablet (10 mg total) by mouth every 6 (six) hours as needed for nausea or vomiting. (Patient not taking: Reported on 11/28/2022) 30 tablet 0   No current facility-administered medications for this visit.    REVIEW OF SYSTEMS:   Constitutional: ( - ) fevers, ( - )  chills , ( - ) night sweats Eyes: ( - ) blurriness of vision, ( - ) double vision, ( - ) watery eyes Ears, nose, mouth, throat, and face: ( - ) mucositis, ( - ) sore throat Respiratory: ( - ) cough, ( - ) dyspnea, ( - ) wheezes Cardiovascular: ( - ) palpitation, ( - ) chest discomfort, ( - ) lower extremity swelling Gastrointestinal:  ( - ) nausea, ( - ) heartburn, ( - ) change in bowel habits Skin: ( - ) abnormal skin rashes Lymphatics: ( - ) new lymphadenopathy, ( - ) easy bruising Neurological: ( - ) numbness, ( - ) tingling, ( - ) new weaknesses Behavioral/Psych: ( - ) mood change, ( - ) new changes  All other systems were reviewed with the patient and  are negative.  PHYSICAL EXAMINATION: ECOG PERFORMANCE STATUS: 1 - Symptomatic but completely ambulatory  Vitals:   12/11/22  1152  BP: 120/68  Pulse: 75  Resp: 15  Temp: 97.9 F (36.6 C)  SpO2: 93%      Filed Weights   12/11/22 1152  Weight: 158 lb 12.8 oz (72 kg)       GENERAL: well appearing elderly Caucasian female, alert, no distress and comfortable SKIN: skin color, texture, turgor are normal, no rashes or significant lesions EYES: conjunctiva are pink and non-injected, sclera clear LUNGS: clear to auscultation and percussion with normal breathing effort HEART: regular rate & rhythm and no murmurs and no lower extremity edema Musculoskeletal: no cyanosis of digits and no clubbing  PSYCH: alert & oriented x 3, fluent speech NEURO: no focal motor/sensory deficits  LABORATORY DATA:  I have reviewed the data as listed    Latest Ref Rng & Units 12/11/2022   11:07 AM 12/04/2022    8:10 AM 11/28/2022    8:32 AM  CBC  WBC 4.0 - 10.5 K/uL 7.4  8.4  9.9   Hemoglobin 12.0 - 15.0 g/dL 13.6  12.6  12.6   Hematocrit 36.0 - 46.0 % 40.4  37.9  37.2   Platelets 150 - 400 K/uL 207  196  225        Latest Ref Rng & Units 12/11/2022   11:07 AM 12/04/2022    8:10 AM 11/28/2022    8:32 AM  CMP  Glucose 70 - 99 mg/dL 115  99  113   BUN 8 - 23 mg/dL 19  18  17   $ Creatinine 0.44 - 1.00 mg/dL 0.63  0.59  0.59   Sodium 135 - 145 mmol/L 131  133  131   Potassium 3.5 - 5.1 mmol/L 4.1  3.9  3.9   Chloride 98 - 111 mmol/L 98  99  98   CO2 22 - 32 mmol/L 26  29  26   $ Calcium 8.9 - 10.3 mg/dL 8.5  8.5  8.4   Total Protein 6.5 - 8.1 g/dL 6.7  5.9  5.8   Total Bilirubin 0.3 - 1.2 mg/dL 0.5  0.7  0.7   Alkaline Phos 38 - 126 U/L 64  62  65   AST 15 - 41 U/L 14  13  16   $ ALT 0 - 44 U/L 10  9  10     $ Lab Results  Component Value Date   MPROTEIN 1.0 (H) 11/28/2022   MPROTEIN 1.3 (H) 10/31/2022   MPROTEIN 2.2 (H) 09/25/2022   Lab Results  Component Value Date   KPAFRELGTCHN 10.3 11/28/2022   KPAFRELGTCHN 11.9 10/31/2022   KPAFRELGTCHN 8.7 09/25/2022   LAMBDASER 5.9 11/28/2022   LAMBDASER 7.9 10/31/2022    LAMBDASER 6.1 09/25/2022   KAPLAMBRATIO 1.75 (H) 11/28/2022   KAPLAMBRATIO 1.51 10/31/2022   KAPLAMBRATIO 1.43 09/25/2022    RADIOGRAPHIC STUDIES: No results found.  ASSESSMENT & PLAN [name] 89 y.o. female with medical history significant for multiple myeloma who presents for a follow up visit.   After review of the labs, review the records, discussion with the patient the findings are most consistent with an IgG lambda multiple myeloma.  The patient meets the diagnostic criteria based on 60% plasma cells in the bone marrow and anemia.  Additionally the patient was noted to have vitamin B12 deficiency which is currently being treated as well.  Given her degree of anemia would recommend proceeding with  VD chemotherapy and will add Revlimid once hemoglobin improves.  The patient and her daughter voiced understanding of the plan moving forward.  # IgG Lambda Multiple Myeloma  --diagnosis of MM confirmed with bone marrow biopsy showing 60% plasma cells and anemia -- Bone survey shows no lytic lesions, kidney function is within normal limits.  --08/01/2022 was Cycle 1 Day 1 of Vd  Plan:  --Today is Cycle 7 Day 1 of Vd chemotherapy.  --Labs show white blood cell count 7.4, Hgb 13.6, MCV 86, Plt 207 --last M protein trended down to 1.0 (4.9 prior to start of therapy). Normalized SFLC.  --at each visit will collect CBC, CMP, and LDH with monthly restaging labs SPEP and SFLC.  --RTC in 2 weeks with interval weekly treatment.   # Normocytic Anemia #Vitamin B12 Deficiency --Anemia likely driven by multiple myeloma, but may be component of vitamin B12 deficiency as well. --initial labs showed elevated MMA with B12 180 -- Continue vitamin B12 1000 mcg p.o. daily -- Continue to monitor  #Left leg/thigh neuropathy: --Currently on gabapentin 600 mg nightly and 300 mg in the morning --Encouraged to try water aerobics and stretches.  --Following with Dr. Dietrich Pates (ortho) to see if she is a  candidate for steroid injections.  -- Patient evaluated by Dr. Mickeal Skinner thinks that this may be neuropathy worsened by her Velcade  #Supportive Care -- chemotherapy education complete -- port placement not required.  --Awaiting to start Zometa/Xgeva  -- zofran 57m q8H PRN and compazine 143mPO q6H for nausea -- acyclovir 40041mO BID for VCZ prophylaxis -- tylenol 1000 mg q8H PRN for back pain.   No orders of the defined types were placed in this encounter.   All questions were answered. The patient knows to call the clinic with any problems, questions or concerns.  A total of more than 30 minutes were spent on this encounter with face-to-face time and non-face-to-face time, including preparing to see the patient, ordering tests and/or medications, counseling the patient and coordination of care as outlined above.   JohLedell PeoplesD Department of Hematology/Oncology [facility] Emergency Deptone: [phone]: [phone]: [email or web address]    12/11/2022 1:44 PM

## 2022-12-11 ENCOUNTER — Inpatient Hospital Stay: Payer: Medicare Other

## 2022-12-11 ENCOUNTER — Inpatient Hospital Stay: Payer: Medicare Other | Admitting: Hematology and Oncology

## 2022-12-11 ENCOUNTER — Other Ambulatory Visit: Payer: Self-pay

## 2022-12-11 VITALS — BP 120/68 | HR 75 | Temp 97.9°F | Resp 15 | Wt 158.8 lb

## 2022-12-11 DIAGNOSIS — C9000 Multiple myeloma not having achieved remission: Secondary | ICD-10-CM

## 2022-12-11 DIAGNOSIS — D649 Anemia, unspecified: Secondary | ICD-10-CM | POA: Diagnosis not present

## 2022-12-11 DIAGNOSIS — E538 Deficiency of other specified B group vitamins: Secondary | ICD-10-CM | POA: Diagnosis not present

## 2022-12-11 DIAGNOSIS — Z5112 Encounter for antineoplastic immunotherapy: Secondary | ICD-10-CM | POA: Diagnosis not present

## 2022-12-11 DIAGNOSIS — G620 Drug-induced polyneuropathy: Secondary | ICD-10-CM | POA: Diagnosis not present

## 2022-12-11 LAB — CBC WITH DIFFERENTIAL (CANCER CENTER ONLY)
Abs Immature Granulocytes: 0.02 10*3/uL (ref 0.00–0.07)
Basophils Absolute: 0.1 10*3/uL (ref 0.0–0.1)
Basophils Relative: 1 %
Eosinophils Absolute: 0.1 10*3/uL (ref 0.0–0.5)
Eosinophils Relative: 2 %
HCT: 40.4 % (ref 36.0–46.0)
Hemoglobin: 13.6 g/dL (ref 12.0–15.0)
Immature Granulocytes: 0 %
Lymphocytes Relative: 5 %
Lymphs Abs: 0.4 10*3/uL — ABNORMAL LOW (ref 0.7–4.0)
MCH: 28.9 pg (ref 26.0–34.0)
MCHC: 33.7 g/dL (ref 30.0–36.0)
MCV: 86 fL (ref 80.0–100.0)
Monocytes Absolute: 0.3 10*3/uL (ref 0.1–1.0)
Monocytes Relative: 4 %
Neutro Abs: 6.6 10*3/uL (ref 1.7–7.7)
Neutrophils Relative %: 88 %
Platelet Count: 207 10*3/uL (ref 150–400)
RBC: 4.7 MIL/uL (ref 3.87–5.11)
RDW: 16.2 % — ABNORMAL HIGH (ref 11.5–15.5)
WBC Count: 7.4 10*3/uL (ref 4.0–10.5)
nRBC: 0 % (ref 0.0–0.2)

## 2022-12-11 LAB — CMP (CANCER CENTER ONLY)
ALT: 10 U/L (ref 0–44)
AST: 14 U/L — ABNORMAL LOW (ref 15–41)
Albumin: 3.7 g/dL (ref 3.5–5.0)
Alkaline Phosphatase: 64 U/L (ref 38–126)
Anion gap: 7 (ref 5–15)
BUN: 19 mg/dL (ref 8–23)
CO2: 26 mmol/L (ref 22–32)
Calcium: 8.5 mg/dL — ABNORMAL LOW (ref 8.9–10.3)
Chloride: 98 mmol/L (ref 98–111)
Creatinine: 0.63 mg/dL (ref 0.44–1.00)
GFR, Estimated: >60 mL/min (ref >60)
Glucose, Bld: 115 mg/dL — ABNORMAL HIGH (ref 70–99)
Potassium: 4.1 mmol/L (ref 3.5–5.1)
Sodium: 131 mmol/L — ABNORMAL LOW (ref 135–145)
Total Bilirubin: 0.5 mg/dL (ref 0.3–1.2)
Total Protein: 6.7 g/dL (ref 6.5–8.1)

## 2022-12-11 MED ORDER — BORTEZOMIB CHEMO SQ INJECTION 3.5 MG (2.5MG/ML)
1.3000 mg/m2 | Freq: Once | INTRAMUSCULAR | Status: AC
  Administered 2022-12-11: 2.25 mg via SUBCUTANEOUS
  Filled 2022-12-11: qty 0.9

## 2022-12-11 NOTE — Patient Instructions (Signed)
[facility]  Discharge Instructions: Thank you for choosing [facility] to provide your oncology and hematology care.   If you have a lab appointment with the [facility], please go directly to the [facility] and check in at the registration area.   Wear comfortable clothing and clothing appropriate for easy access to any Portacath or PICC line.   We strive to give you quality time with your provider. You may need to reschedule your appointment if you arrive late (15 or more minutes).  Arriving late affects you and other patients whose appointments are after yours.  Also, if you miss three or more appointments without notifying the office, you may be dismissed from the clinic at the provider's discretion.      For prescription refill requests, have your pharmacy contact our office and allow 72 hours for refills to be completed.    Today you received the following chemotherapy and/or immunotherapy agents: Velcade.       To help prevent nausea and vomiting after your treatment, we encourage you to take your nausea medication as directed.  BELOW ARE SYMPTOMS THAT SHOULD BE REPORTED IMMEDIATELY: *FEVER GREATER THAN 100.4 F (38 C) OR HIGHER *CHILLS OR SWEATING *NAUSEA AND VOMITING THAT IS NOT CONTROLLED WITH YOUR NAUSEA MEDICATION *UNUSUAL SHORTNESS OF BREATH *UNUSUAL BRUISING OR BLEEDING *URINARY PROBLEMS (pain or burning when urinating, or frequent urination) *BOWEL PROBLEMS (unusual diarrhea, constipation, pain near the anus) TENDERNESS IN MOUTH AND THROAT WITH OR WITHOUT PRESENCE OF ULCERS (sore throat, sores in mouth, or a toothache) UNUSUAL RASH, SWELLING OR PAIN  UNUSUAL VAGINAL DISCHARGE OR ITCHING   Items with * indicate a potential emergency and should be followed up as soon as possible or go to the Emergency Department if any problems should occur.  Please show the CHEMOTHERAPY ALERT CARD or IMMUNOTHERAPY ALERT CARD at  check-in to the Emergency Department and triage nurse.  Should you have questions after your visit or need to cancel or reschedule your appointment, please contact [facility]  Dept: [phone]  and follow the prompts.  Office hours are 8:00 a.m. to 4:30 p.m. Monday - Friday. Please note that voicemails left after 4:00 p.m. may not be returned until the following business day.  We are closed weekends and major holidays. You have access to a nurse at all times for urgent questions. Please call the main number to the clinic Dept: [phone] and follow the prompts.   For any non-urgent questions, you may also contact your provider using MyChart. We now offer e-Visits for anyone 74 and older to request care online for non-urgent symptoms. For details visit [email or web address].   Also download the MyChart app! Go to the app store, search "MyChart", open the app, select [facility], and log in with your MyChart username and password.

## 2022-12-12 ENCOUNTER — Other Ambulatory Visit: Payer: Medicare Other

## 2022-12-12 ENCOUNTER — Ambulatory Visit: Payer: Medicare Other

## 2022-12-12 ENCOUNTER — Ambulatory Visit: Payer: Medicare Other | Admitting: Hematology and Oncology

## 2022-12-13 DIAGNOSIS — M6281 Muscle weakness (generalized): Secondary | ICD-10-CM | POA: Diagnosis not present

## 2022-12-13 DIAGNOSIS — M545 Low back pain, unspecified: Secondary | ICD-10-CM | POA: Diagnosis not present

## 2022-12-13 DIAGNOSIS — M546 Pain in thoracic spine: Secondary | ICD-10-CM | POA: Diagnosis not present

## 2022-12-13 DIAGNOSIS — R2689 Other abnormalities of gait and mobility: Secondary | ICD-10-CM | POA: Diagnosis not present

## 2022-12-15 ENCOUNTER — Telehealth: Payer: Self-pay

## 2022-12-15 NOTE — Telephone Encounter (Signed)
Patient called to say that she has lost her remaining 3 Revlimid pills. Per Dr. Lorenso Courier OK  to wait until next shipment to restart.

## 2022-12-18 ENCOUNTER — Inpatient Hospital Stay: Payer: Medicare Other

## 2022-12-18 VITALS — BP 119/50 | HR 69 | Temp 97.8°F | Resp 17 | Wt 156.0 lb

## 2022-12-18 DIAGNOSIS — D649 Anemia, unspecified: Secondary | ICD-10-CM | POA: Diagnosis not present

## 2022-12-18 DIAGNOSIS — C9000 Multiple myeloma not having achieved remission: Secondary | ICD-10-CM

## 2022-12-18 DIAGNOSIS — Z5112 Encounter for antineoplastic immunotherapy: Secondary | ICD-10-CM | POA: Diagnosis not present

## 2022-12-18 DIAGNOSIS — G620 Drug-induced polyneuropathy: Secondary | ICD-10-CM | POA: Diagnosis not present

## 2022-12-18 DIAGNOSIS — E538 Deficiency of other specified B group vitamins: Secondary | ICD-10-CM | POA: Diagnosis not present

## 2022-12-18 LAB — CBC WITH DIFFERENTIAL (CANCER CENTER ONLY)
Abs Immature Granulocytes: 0.02 10*3/uL (ref 0.00–0.07)
Basophils Absolute: 0.1 10*3/uL (ref 0.0–0.1)
Basophils Relative: 1 %
Eosinophils Absolute: 0.2 10*3/uL (ref 0.0–0.5)
Eosinophils Relative: 3 %
HCT: 39 % (ref 36.0–46.0)
Hemoglobin: 13.1 g/dL (ref 12.0–15.0)
Immature Granulocytes: 0 %
Lymphocytes Relative: 11 %
Lymphs Abs: 0.8 10*3/uL (ref 0.7–4.0)
MCH: 29.3 pg (ref 26.0–34.0)
MCHC: 33.6 g/dL (ref 30.0–36.0)
MCV: 87.2 fL (ref 80.0–100.0)
Monocytes Absolute: 1.1 10*3/uL — ABNORMAL HIGH (ref 0.1–1.0)
Monocytes Relative: 16 %
Neutro Abs: 5 10*3/uL (ref 1.7–7.7)
Neutrophils Relative %: 69 %
Platelet Count: 238 10*3/uL (ref 150–400)
RBC: 4.47 MIL/uL (ref 3.87–5.11)
RDW: 16 % — ABNORMAL HIGH (ref 11.5–15.5)
WBC Count: 7.2 10*3/uL (ref 4.0–10.5)
nRBC: 0 % (ref 0.0–0.2)

## 2022-12-18 LAB — CMP (CANCER CENTER ONLY)
ALT: 10 U/L (ref 0–44)
AST: 13 U/L — ABNORMAL LOW (ref 15–41)
Albumin: 3.3 g/dL — ABNORMAL LOW (ref 3.5–5.0)
Alkaline Phosphatase: 64 U/L (ref 38–126)
Anion gap: 7 (ref 5–15)
BUN: 18 mg/dL (ref 8–23)
CO2: 28 mmol/L (ref 22–32)
Calcium: 8.2 mg/dL — ABNORMAL LOW (ref 8.9–10.3)
Chloride: 97 mmol/L — ABNORMAL LOW (ref 98–111)
Creatinine: 0.59 mg/dL (ref 0.44–1.00)
GFR, Estimated: >60 mL/min (ref >60)
Glucose, Bld: 98 mg/dL (ref 70–99)
Potassium: 3.7 mmol/L (ref 3.5–5.1)
Sodium: 132 mmol/L — ABNORMAL LOW (ref 135–145)
Total Bilirubin: 0.7 mg/dL (ref 0.3–1.2)
Total Protein: 6 g/dL — ABNORMAL LOW (ref 6.5–8.1)

## 2022-12-18 MED ORDER — BORTEZOMIB CHEMO SQ INJECTION 3.5 MG (2.5MG/ML)
1.3000 mg/m2 | Freq: Once | INTRAMUSCULAR | Status: AC
  Administered 2022-12-18: 2.25 mg via SUBCUTANEOUS
  Filled 2022-12-18: qty 0.9

## 2022-12-18 NOTE — Patient Instructions (Signed)
[facility]  Discharge Instructions: Thank you for choosing [facility] to provide your oncology and hematology care.   If you have a lab appointment with the [facility], please go directly to the [facility] and check in at the registration area.   Wear comfortable clothing and clothing appropriate for easy access to any Portacath or PICC line.   We strive to give you quality time with your provider. You may need to reschedule your appointment if you arrive late (15 or more minutes).  Arriving late affects you and other patients whose appointments are after yours.  Also, if you miss three or more appointments without notifying the office, you may be dismissed from the clinic at the provider's discretion.      For prescription refill requests, have your pharmacy contact our office and allow 72 hours for refills to be completed.    Today you received the following chemotherapy and/or immunotherapy agents Velcade   To help prevent nausea and vomiting after your treatment, we encourage you to take your nausea medication as directed.  BELOW ARE SYMPTOMS THAT SHOULD BE REPORTED IMMEDIATELY: *FEVER GREATER THAN 100.4 F (38 C) OR HIGHER *CHILLS OR SWEATING *NAUSEA AND VOMITING THAT IS NOT CONTROLLED WITH YOUR NAUSEA MEDICATION *UNUSUAL SHORTNESS OF BREATH *UNUSUAL BRUISING OR BLEEDING *URINARY PROBLEMS (pain or burning when urinating, or frequent urination) *BOWEL PROBLEMS (unusual diarrhea, constipation, pain near the anus) TENDERNESS IN MOUTH AND THROAT WITH OR WITHOUT PRESENCE OF ULCERS (sore throat, sores in mouth, or a toothache) UNUSUAL RASH, SWELLING OR PAIN  UNUSUAL VAGINAL DISCHARGE OR ITCHING   Items with * indicate a potential emergency and should be followed up as soon as possible or go to the Emergency Department if any problems should occur.  Please show the CHEMOTHERAPY ALERT CARD or IMMUNOTHERAPY ALERT CARD at check-in to  the Emergency Department and triage nurse.  Should you have questions after your visit or need to cancel or reschedule your appointment, please contact [facility]  Dept: [phone]  and follow the prompts.  Office hours are 8:00 a.m. to 4:30 p.m. Monday - Friday. Please note that voicemails left after 4:00 p.m. may not be returned until the following business day.  We are closed weekends and major holidays. You have access to a nurse at all times for urgent questions. Please call the main number to the clinic Dept: [phone] and follow the prompts.   For any non-urgent questions, you may also contact your provider using MyChart. We now offer e-Visits for anyone 71 and older to request care online for non-urgent symptoms. For details visit [email or web address].   Also download the MyChart app! Go to the app store, search "MyChart", open the app, select [facility], and log in with your MyChart username and password.

## 2022-12-19 ENCOUNTER — Other Ambulatory Visit: Payer: Medicare Other

## 2022-12-19 ENCOUNTER — Ambulatory Visit: Payer: Medicare Other

## 2022-12-19 ENCOUNTER — Telehealth: Payer: Self-pay

## 2022-12-19 NOTE — Telephone Encounter (Signed)
faxed patient assistance for Farxiga 10 mg on 12/19/2022. LM

## 2022-12-20 ENCOUNTER — Other Ambulatory Visit: Payer: Self-pay

## 2022-12-20 ENCOUNTER — Encounter (INDEPENDENT_AMBULATORY_CARE_PROVIDER_SITE_OTHER): Payer: Medicare Other | Admitting: Ophthalmology

## 2022-12-20 DIAGNOSIS — C9000 Multiple myeloma not having achieved remission: Secondary | ICD-10-CM

## 2022-12-20 DIAGNOSIS — H35033 Hypertensive retinopathy, bilateral: Secondary | ICD-10-CM

## 2022-12-20 DIAGNOSIS — H43813 Vitreous degeneration, bilateral: Secondary | ICD-10-CM | POA: Diagnosis not present

## 2022-12-20 DIAGNOSIS — I10 Essential (primary) hypertension: Secondary | ICD-10-CM

## 2022-12-20 DIAGNOSIS — H353231 Exudative age-related macular degeneration, bilateral, with active choroidal neovascularization: Secondary | ICD-10-CM

## 2022-12-20 DIAGNOSIS — H35372 Puckering of macula, left eye: Secondary | ICD-10-CM

## 2022-12-20 MED ORDER — LENALIDOMIDE 25 MG PO CAPS: 25.0000 mg | ORAL_CAPSULE | Freq: Every day | ORAL | 0 refills | Status: DC

## 2022-12-22 DIAGNOSIS — M546 Pain in thoracic spine: Secondary | ICD-10-CM | POA: Diagnosis not present

## 2022-12-22 DIAGNOSIS — M6281 Muscle weakness (generalized): Secondary | ICD-10-CM | POA: Diagnosis not present

## 2022-12-22 DIAGNOSIS — R2689 Other abnormalities of gait and mobility: Secondary | ICD-10-CM | POA: Diagnosis not present

## 2022-12-22 DIAGNOSIS — M545 Low back pain, unspecified: Secondary | ICD-10-CM | POA: Diagnosis not present

## 2022-12-25 ENCOUNTER — Telehealth: Payer: Self-pay

## 2022-12-25 NOTE — Telephone Encounter (Signed)
Patient called with questions regarding when her Revlimid would be shipped. Provided patient with phone number to [employer].

## 2022-12-26 ENCOUNTER — Inpatient Hospital Stay (HOSPITAL_BASED_OUTPATIENT_CLINIC_OR_DEPARTMENT_OTHER): Payer: Medicare Other | Admitting: Physician Assistant

## 2022-12-26 ENCOUNTER — Inpatient Hospital Stay: Payer: Medicare Other

## 2022-12-26 ENCOUNTER — Inpatient Hospital Stay (HOSPITAL_BASED_OUTPATIENT_CLINIC_OR_DEPARTMENT_OTHER): Payer: Medicare Other | Admitting: Internal Medicine

## 2022-12-26 ENCOUNTER — Inpatient Hospital Stay: Payer: Medicare Other | Attending: Physician Assistant

## 2022-12-26 ENCOUNTER — Telehealth: Payer: Self-pay

## 2022-12-26 VITALS — BP 125/58 | HR 68 | Temp 98.1°F | Resp 13 | Wt 165.7 lb

## 2022-12-26 DIAGNOSIS — T451X5A Adverse effect of antineoplastic and immunosuppressive drugs, initial encounter: Secondary | ICD-10-CM

## 2022-12-26 DIAGNOSIS — R21 Rash and other nonspecific skin eruption: Secondary | ICD-10-CM | POA: Diagnosis not present

## 2022-12-26 DIAGNOSIS — E871 Hypo-osmolality and hyponatremia: Secondary | ICD-10-CM | POA: Diagnosis not present

## 2022-12-26 DIAGNOSIS — D649 Anemia, unspecified: Secondary | ICD-10-CM | POA: Insufficient documentation

## 2022-12-26 DIAGNOSIS — E538 Deficiency of other specified B group vitamins: Secondary | ICD-10-CM | POA: Diagnosis not present

## 2022-12-26 DIAGNOSIS — I5033 Acute on chronic diastolic (congestive) heart failure: Secondary | ICD-10-CM

## 2022-12-26 DIAGNOSIS — Z5112 Encounter for antineoplastic immunotherapy: Secondary | ICD-10-CM | POA: Insufficient documentation

## 2022-12-26 DIAGNOSIS — C9000 Multiple myeloma not having achieved remission: Secondary | ICD-10-CM

## 2022-12-26 DIAGNOSIS — R197 Diarrhea, unspecified: Secondary | ICD-10-CM | POA: Diagnosis not present

## 2022-12-26 DIAGNOSIS — R600 Localized edema: Secondary | ICD-10-CM

## 2022-12-26 DIAGNOSIS — G620 Drug-induced polyneuropathy: Secondary | ICD-10-CM | POA: Insufficient documentation

## 2022-12-26 DIAGNOSIS — Z452 Encounter for adjustment and management of vascular access device: Secondary | ICD-10-CM | POA: Diagnosis not present

## 2022-12-26 LAB — CMP (CANCER CENTER ONLY)
ALT: 9 U/L (ref 0–44)
AST: 15 U/L (ref 15–41)
Albumin: 3.4 g/dL — ABNORMAL LOW (ref 3.5–5.0)
Alkaline Phosphatase: 66 U/L (ref 38–126)
Anion gap: 8 (ref 5–15)
BUN: 16 mg/dL (ref 8–23)
CO2: 28 mmol/L (ref 22–32)
Calcium: 8.3 mg/dL — ABNORMAL LOW (ref 8.9–10.3)
Chloride: 99 mmol/L (ref 98–111)
Creatinine: 0.64 mg/dL (ref 0.44–1.00)
GFR, Estimated: >60 mL/min (ref >60)
Glucose, Bld: 100 mg/dL — ABNORMAL HIGH (ref 70–99)
Potassium: 3.9 mmol/L (ref 3.5–5.1)
Sodium: 135 mmol/L (ref 135–145)
Total Bilirubin: 0.7 mg/dL (ref 0.3–1.2)
Total Protein: 6 g/dL — ABNORMAL LOW (ref 6.5–8.1)

## 2022-12-26 LAB — CBC WITH DIFFERENTIAL (CANCER CENTER ONLY)
Abs Immature Granulocytes: 0.02 10*3/uL (ref 0.00–0.07)
Basophils Absolute: 0.1 10*3/uL (ref 0.0–0.1)
Basophils Relative: 1 %
Eosinophils Absolute: 0.2 10*3/uL (ref 0.0–0.5)
Eosinophils Relative: 2 %
HCT: 38.4 % (ref 36.0–46.0)
Hemoglobin: 12.7 g/dL (ref 12.0–15.0)
Immature Granulocytes: 0 %
Lymphocytes Relative: 9 %
Lymphs Abs: 0.7 10*3/uL (ref 0.7–4.0)
MCH: 29.1 pg (ref 26.0–34.0)
MCHC: 33.1 g/dL (ref 30.0–36.0)
MCV: 88.1 fL (ref 80.0–100.0)
Monocytes Absolute: 1 10*3/uL (ref 0.1–1.0)
Monocytes Relative: 12 %
Neutro Abs: 6.3 10*3/uL (ref 1.7–7.7)
Neutrophils Relative %: 76 %
Platelet Count: 197 10*3/uL (ref 150–400)
RBC: 4.36 MIL/uL (ref 3.87–5.11)
RDW: 15.9 % — ABNORMAL HIGH (ref 11.5–15.5)
WBC Count: 8.4 10*3/uL (ref 4.0–10.5)
nRBC: 0 % (ref 0.0–0.2)

## 2022-12-26 MED ORDER — FUROSEMIDE 40 MG PO TABS: 40.0000 mg | ORAL_TABLET | Freq: Every day | ORAL | 1 refills | Status: DC

## 2022-12-26 MED ORDER — BORTEZOMIB CHEMO SQ INJECTION 3.5 MG (2.5MG/ML)
1.3000 mg/m2 | Freq: Once | INTRAMUSCULAR | Status: AC
  Administered 2022-12-26: 2.25 mg via SUBCUTANEOUS
  Filled 2022-12-26: qty 0.9

## 2022-12-26 MED ORDER — POTASSIUM CHLORIDE CRYS ER 10 MEQ PO TBCR: 20.0000 meq | EXTENDED_RELEASE_TABLET | Freq: Two times a day (BID) | ORAL | 1 refills | Status: DC

## 2022-12-26 NOTE — Telephone Encounter (Signed)
Patient's daughter called and stated that patient is having some leg swelling and gained 9lbs in a week, but patient denies SOB, chest pain, and dizziness. She is now wearing compression socks, after not wearing them for a few days. Should she take an extra tablet of Lasix, if so how many and for how many days? Also, does she need to increase potassium if she takes extra Lasix. Or do you want her to come in to be seen? Please advise.   Patient is on Mychart and can check messages there.

## 2022-12-26 NOTE — Progress Notes (Signed)
Zoe [name] at [facility] [place], Zoe [name] [id number] [phone]   Interval Evaluation  Date of Service: 12/26/22 Patient Name: Zoe [name] Patient MRN: [id number] Patient DOB: 1934-01-07 Provider: Ventura Sellers, MD  Identifying Statement:  Zoe [name] is a 89 y.o. female with Chemotherapy-induced neuropathy ([place])   Primary Cancer:  Oncologic History: Oncology History  Multiple myeloma not having achieved remission ([place])  07/24/2022 Initial Diagnosis   Multiple myeloma not having achieved remission ([place])   08/01/2022 -  Chemotherapy   Patient is on Treatment Plan : MYELOMA NON-TRANSPLANT CANDIDATES VRd weekly q21d       Interval History: Zoe [name] presents today for neuropathy follow up.  She describes improvement in symptom burden since increasing the gabapentin to '600mg'$  at night.  She also doses '300mg'$  in the morning.  Stays active, is independent.  Continues on velcade with Dr. Lorenso Courier.  H+P (11/28/22) Patient presents to review neuropathy symptoms.  She describes burning type pain in her left leg, primarily.  There is also some numbness in her feet.  Onset was several months ago, since chemotherapy was started.  She has been walking independently, typically with a cane for chronic back issues.  She has been dosing lyrica '100mg'$  BID, but this has been ineffective.  Denies headaches, seizures.  Continues on velcade with Dr. Lorenso Courier for myeloma.      Medications: Current Outpatient Medications on File Prior to Visit  Medication Sig Dispense Refill   acyclovir (ZOVIRAX) 400 MG tablet Take 400 mg by mouth 2 (two) times daily. Pt takes 1 tablet (400 mg total) by mouth 2 (two) times daily.     Artificial Tear Solution (SOOTHE XP) SOLN Place 1 drop into both eyes every evening.     aspirin EC 81 MG tablet Take 81 mg by mouth at bedtime. Swallow whole.     calcitonin, salmon, (MIACALCIN/FORTICAL) 200 UNIT/ACT nasal spray  Place 1 spray into alternate nostrils daily.     carvedilol (COREG) 12.5 MG tablet Take 12.5 mg by mouth 2 (two) times daily with a meal.     Cholecalciferol (VITAMIN D3) 50 MCG (2000 UT) capsule Take 1 capsule (2,000 Units total) by mouth daily.     cyanocobalamin (VITAMIN B12) 1000 MCG tablet Take 1,000 mcg by mouth daily.     dapagliflozin propanediol (FARXIGA) 10 MG TABS tablet TAKE 1 TABLET BY MOUTH EVERY DAY 90 tablet 2   dexamethasone (DECADRON) 4 MG tablet Take 5 tablets (20 mg total) by mouth once a week. Take 20 mg (5 tablets) on day of multiple myeloma treatment 20 tablet 5   esomeprazole (NEXIUM) 20 MG capsule Take 20 mg by mouth daily as needed (Heartburn).     ferrous sulfate 325 (65 FE) MG tablet Take 325 mg by mouth daily with breakfast. (Patient not taking: Reported on 11/28/2022)     furosemide (LASIX) 20 MG tablet PLEASE SEE ATTACHED FOR DETAILED DIRECTIONS (Patient taking differently: Take 20 mg by mouth daily.) 90 tablet 1   gabapentin (NEURONTIN) 300 MG capsule Take 1 capsule (300 mg total) by mouth 2 (two) times daily. 60 capsule 2   lenalidomide (REVLIMID) 25 MG capsule Take 1 capsule (25 mg total) by mouth daily. Take for 14 days, then none for 7 days. Repeat every 21 days. [employer] Auth # [id number] Date Obtained 12/20/2022 14 capsule 0   levothyroxine (SYNTHROID, LEVOTHROID) 100 MCG tablet Take 100 mcg by mouth daily before breakfast.  2   losartan (COZAAR) 25 MG tablet TAKE 1 TABLET (25 MG TOTAL) BY MOUTH DAILY. 90 tablet 1   ondansetron (ZOFRAN) 8 MG tablet Take 1 tablet (8 mg total) by mouth every 8 (eight) hours as needed. (Patient not taking: Reported on 11/28/2022) 30 tablet 0   polyethylene glycol (MIRALAX / GLYCOLAX) 17 g packet Take 17 g by mouth daily as needed for mild constipation.     potassium chloride (KLOR-CON M10) 10 MEQ tablet TAKE 2 TABLETS BY MOUTH EVERY DAY AS NEEDED WITH FUROSEMIDE 180 tablet 1   pravastatin (PRAVACHOL) 40 MG tablet Take 40 mg by mouth every  evening.     prochlorperazine (COMPAZINE) 10 MG tablet Take 1 tablet (10 mg total) by mouth every 6 (six) hours as needed for nausea or vomiting. (Patient not taking: Reported on 11/28/2022) 30 tablet 0   No current facility-administered medications on file prior to visit.    Allergies:  Allergies  Allergen Reactions   Penicillins Other (See Comments)    UNSPECIFIED REACTION  Patient does not remember reaction.  Has patient had a PCN reaction causing immediate rash, facial/tongue/throat swelling, SOB or lightheadedness with hypotension: no Has patient had a PCN reaction causing severe rash involving mucus membranes or skin necrosis: no Has patient had a PCN reaction that required hospitalization no Has patient had a PCN reaction occurring within the last 10 years: no If all of the above answers are "NO", then may proceed with Cephalosporin use.    Sulfa Antibiotics Other (See Comments)    UNSPECIFIED REACTION  "maybe vision issues? "   Past Medical History:  Past Medical History:  Diagnosis Date   Arthritis    some - per patient   Breast cancer ([place])    breast cancer / left    Cataract    bilat    GERD (gastroesophageal reflux disease)    History of kidney stones    Hyperlipidemia    Hypertension    Hypothyroidism    Macular degeneration    Left   S/P TAVR (transcatheter aortic valve replacement) 09/03/2018   23 mm Edwards Sapien 3 transcatheter heart valve placed via percutaneous right transfemoral approach    Severe aortic stenosis    Stress incontinence    Thyroid disease    Tinnitus    Past Surgical History:  Past Surgical History:  Procedure Laterality Date   ABDOMINAL HYSTERECTOMY  1970's   BACK SURGERY     BREAST LUMPECTOMY  12/1998   lumpectomy   CARDIAC CATHETERIZATION     EYE SURGERY     cataract surgery bilat    INTRAOPERATIVE TRANSTHORACIC ECHOCARDIOGRAM N/A 09/03/2018   Procedure: INTRAOPERATIVE TRANSTHORACIC ECHOCARDIOGRAM;  Surgeon: Burnell Blanks, MD;  Location: [facility];  Service: Open Heart Surgery;  Laterality: N/A;   KYPHOPLASTY N/A 09/07/2022   Procedure: THORACIC EIGHT KYPHOPLASTY;  Surgeon: Melina Schools, MD;  Location: [facility];  Service: Orthopedics;  Laterality: N/A;  1 hr Local with [facility] 3 C-Bed   LITHOTRIPSY     Right total knee     2018 Dr. Alvan Dame   RIGHT/LEFT HEART CATH AND CORONARY ANGIOGRAPHY N/A 08/06/2018   Procedure: RIGHT/LEFT HEART CATH AND CORONARY ANGIOGRAPHY;  Surgeon: Adrian Prows, MD;  Location: [facility];  Service: Cardiovascular;  Laterality: N/A;   THYROIDECTOMY, PARTIAL  1975   TONSILLECTOMY     as a child - patient not sure of exact date   TOTAL KNEE ARTHROPLASTY Left 03/13/2016  Procedure: TOTAL KNEE ARTHROPLASTY;  Surgeon: Paralee Cancel, MD;  Location: [facility];  Service: Orthopedics;  Laterality: Left;   TOTAL KNEE ARTHROPLASTY Right 06/18/2017   Procedure: RIGHT TOTAL KNEE ARTHROPLASTY;  Surgeon: Paralee Cancel, MD;  Location: [facility];  Service: Orthopedics;  Laterality: Right;   TRANSCATHETER AORTIC VALVE REPLACEMENT, TRANSFEMORAL N/A 09/03/2018   Procedure: TRANSCATHETER AORTIC VALVE REPLACEMENT, TRANSFEMORAL;  Surgeon: Burnell Blanks, MD;  Location: [facility];  Service: Open Heart Surgery;  Laterality: N/A;   Social History:  Social History   Socioeconomic History   Marital status: Widowed    Spouse name: Not on file   Number of children: 4   Years of education: Not on file   Highest education level: Master's degree (e.g., MA, MS, MEng, MEd, MSW, MBA)  Occupational History   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  Tobacco Use   Smoking status: Never   Smokeless tobacco: Never  Vaping Use   Vaping Use: Never used  Substance and Sexual Activity   Alcohol use: No   Drug use: No   Sexual activity: Not Currently  Other Topics Concern   Not on file  Social History Narrative   Not on file   Social Determinants of Health   Financial  Resource Strain: Low Risk  (11/12/2018)   Overall Financial Resource Strain (CARDIA)    Difficulty of Paying Living Expenses: Not very hard  Food Insecurity: No Food Insecurity (11/12/2018)   Hunger Vital Sign    Worried About Running Out of Food in the Last Year: Never true    Ran Out of Food in the Last Year: Never true  Transportation Needs: No Transportation Needs (11/12/2018)   PRAPARE - Hydrologist (Medical): No    Lack of Transportation (Non-Medical): No  Physical Activity: Inactive (11/12/2018)   Exercise Vital Sign    Days of Exercise per Week: 0 days    Minutes of Exercise per Session: 0 min  Stress: Stress Concern Present (11/12/2018)   [place]    Feeling of Stress : To some extent  Social Connections: Not on file  Intimate Partner Violence: Not on file   Family History:  Family History  Problem Relation Age of Onset   Diabetes Mother    Stroke Mother        Carotid artery disease   Heart disease Father        CAD   Coronary artery disease Father    Diabetes Sister     Review of Systems: Constitutional: Doesn't report fevers, chills or abnormal weight loss Eyes: Doesn't report blurriness of vision Ears, nose, mouth, throat, and face: Doesn't report sore throat Respiratory: Doesn't report cough, dyspnea or wheezes Cardiovascular: Doesn't report palpitation, chest discomfort  Gastrointestinal:  Doesn't report nausea, constipation, diarrhea GU: Doesn't report incontinence Skin: Doesn't report skin rashes Neurological: Per HPI Musculoskeletal: Doesn't report joint pain Behavioral/Psych: Doesn't report anxiety  Physical Exam: Wt Readings from Last 3 Encounters:  12/26/22 165 lb 11.2 oz (75.2 kg)  12/18/22 156 lb (70.8 kg)  12/11/22 158 lb 12.8 oz (72 kg)   Temp Readings from Last 3 Encounters:  12/26/22 98.1 F (36.7 C) (Oral)  12/18/22 97.8 F (36.6 C) (Oral)   12/11/22 97.9 F (36.6 C) (Oral)   BP Readings from Last 3 Encounters:  12/26/22 (!) 125/58  12/18/22 (!) 119/50  12/11/22 120/68   Pulse Readings from Last 3 Encounters:  12/26/22 68  12/18/22 69  12/11/22 75   KPS: 80. General: Alert, cooperative, pleasant, in no acute distress Head: Normal EENT: No conjunctival injection or scleral icterus.  Lungs: Resp effort normal Cardiac: Regular rate Abdomen: Non-distended abdomen Skin: No rashes cyanosis or petechiae. Extremities: No clubbing or edema  Neurologic Exam: Mental Status: Awake, alert, attentive to examiner. Oriented to self and environment. Language is fluent with intact comprehension.  Cranial Nerves: Visual acuity is grossly normal. Visual fields are full. Extra-ocular movements intact. No ptosis. Face is symmetric Motor: Tone and bulk are normal. Power is full in both arms and legs. Reflexes are symmetric, no pathologic reflexes present.  Sensory: Intact to light touch Gait: Cane assisted   Labs: I have reviewed the data as listed    Component Value Date/Time   NA 135 12/26/2022 0800   NA 130 (L) 06/19/2022 1012   K 3.9 12/26/2022 0800   CL 99 12/26/2022 0800   CO2 28 12/26/2022 0800   GLUCOSE 100 (H) 12/26/2022 0800   BUN 16 12/26/2022 0800   BUN 21 06/19/2022 1012   CREATININE 0.64 12/26/2022 0800   CALCIUM 8.3 (L) 12/26/2022 0800   PROT 6.0 (L) 12/26/2022 0800   PROT 9.2 (H) 07/16/2019 0941   ALBUMIN 3.4 (L) 12/26/2022 0800   ALBUMIN 2.9 (L) 07/16/2019 0941   AST 15 12/26/2022 0800   ALT 9 12/26/2022 0800   ALKPHOS 66 12/26/2022 0800   BILITOT 0.7 12/26/2022 0800   GFRNONAA >60 12/26/2022 0800   GFRAA 78 07/16/2019 0941   Lab Results  Component Value Date   WBC 8.4 12/26/2022   NEUTROABS 6.3 12/26/2022   HGB 12.7 12/26/2022   HCT 38.4 12/26/2022   MCV 88.1 12/26/2022   PLT 197 12/26/2022     Assessment/Plan Chemotherapy-induced neuropathy ([place])  [place] B Tarnow presents with  clinical syndrome consistent with symmetric, length dependent, small and large fiber peripheral neuropathy.  Etiology is exposure to velcade chemotherapy.  There is likely an underlying radiculopathy component as well given her history and prior spine imaging.  [place] with continuing current dose of gabapentin, 300/600.  May increase to 600/900 if symptoms worsen.  We appreciate the opportunity to participate in the care of [place].  She will return to clinic in ~4 months or sooner if needed.  All questions were answered. The patient knows to call the clinic with any problems, questions or concerns. No barriers to learning were detected.  The total time spent in the encounter was 30 minutes and more than 50% was on counseling and review of test results   Ventura Sellers, MD Medical Director of Neuro-Oncology [facility] [place] 12/26/22 10:49 AM

## 2022-12-26 NOTE — Progress Notes (Signed)
[facility] [phone]   [phone]  PROGRESS NOTE  Patient Care Team: [facility] as PCP - General (Rheumatology)  Hematological/Oncological History # IgG Lambda Multiple Myeloma  06/14/2022: establish care with [name] due to anemia. Labs showed M protein 4.9, Kappa 7.2, Lambda 10.8, ratio 0.67 07/13/2022: Bmbx showed Lambda restricted plasma cell neoplasm involving approximately 60% of the cellular marrow by IHC on the biopsy.  08/01/2022: Cycle 1 Day 1 of VRd chemotherapy. (Holding revlimid initially) 08/21/2022:  Cycle 2 Day 1 of VRd chemotherapy. (Holding revlimid) 09/04/2022: Cycle 3 Day 1 of VRd chemotherapy. Started revlimid 10/03/2022: Cycle 4 Day 1 of VRd chemotherapy 10/31/2022: Cycle 5 Day 1 of VRd chemotherapy 11/20/2022: Cycle 6 Day 1 of VRd chemotherapy 12/11/2022: Cycle 7 Day 1 of VRd chemotherapy  Interval History:  Zoe [name] 89 y.o. female with medical history significant for newly diagnosed multiple myeloma who presents for a follow up visit. The patient's last visit was on 12/11/2022. In the interim since the last visit she has continued VRd chemotherapy.   On exam today Zoe [name] reports her energy and appetite are stable. She does have some unsteadiness and uses a cane to help with ambulation. She reports her neuropathic pain in her legs have improved with the use of gabapentin and she can sleep better. She takes gabapentin 300 mg in the morning and 600 mg in the evening. She reports her  back pain has improved since the last visit. She denies nausea, vomiting or abdominal pain. She reports some constipation with straining during a bowel movement. She only takes an OTC stool softener as needed. She denies easy bruising or signs of active bleeding. She reports worsening lower extremity edema that contributes to her 8 lb weight gain since 12/11/2022. She is compliant with taking her lasix 20 mg daily. She  denies fevers, chills, sweats, shortness of breath, chest pain or cough. She has no other complaints.  Full 10 point ROS was otherwise negative.  MEDICAL HISTORY:  Past Medical History:  Diagnosis Date   Arthritis    some - per patient   Breast cancer ([place])    breast cancer / left    Cataract    bilat    GERD (gastroesophageal reflux disease)    History of kidney stones    Hyperlipidemia    Hypertension    Hypothyroidism    Macular degeneration    Left   S/P TAVR (transcatheter aortic valve replacement) 09/03/2018   23 mm Edwards Sapien 3 transcatheter heart valve placed via percutaneous right transfemoral approach    Severe aortic stenosis    Stress incontinence    Thyroid disease    Tinnitus     SURGICAL HISTORY: Past Surgical History:  Procedure Laterality Date   ABDOMINAL HYSTERECTOMY  1970's   BACK SURGERY     BREAST LUMPECTOMY  12/1998   lumpectomy   CARDIAC CATHETERIZATION     EYE SURGERY     cataract surgery bilat    INTRAOPERATIVE TRANSTHORACIC ECHOCARDIOGRAM N/A 09/03/2018   Procedure: INTRAOPERATIVE TRANSTHORACIC ECHOCARDIOGRAM;  Surgeon: Burnell Blanks, MD;  Location: [facility];  Service: Open Heart Surgery;  Laterality: N/A;   KYPHOPLASTY N/A 09/07/2022   Procedure: THORACIC EIGHT KYPHOPLASTY;  Surgeon: Melina Schools, MD;  Location: [facility];  Service: Orthopedics;  Laterality: N/A;  1 hr Local with [facility] 3 C-Bed   LITHOTRIPSY     Right total knee     2018 Dr. Alvan Dame  RIGHT/LEFT HEART CATH AND CORONARY ANGIOGRAPHY N/A 08/06/2018   Procedure: RIGHT/LEFT HEART CATH AND CORONARY ANGIOGRAPHY;  Surgeon: Adrian Prows, MD;  Location: [facility];  Service: Cardiovascular;  Laterality: N/A;   THYROIDECTOMY, PARTIAL  1975   TONSILLECTOMY     as a child - patient not sure of exact date   TOTAL KNEE ARTHROPLASTY Left 03/13/2016   Procedure: TOTAL KNEE ARTHROPLASTY;  Surgeon: Paralee Cancel, MD;  Location: [facility];  Service: Orthopedics;  Laterality: Left;    TOTAL KNEE ARTHROPLASTY Right 06/18/2017   Procedure: RIGHT TOTAL KNEE ARTHROPLASTY;  Surgeon: Paralee Cancel, MD;  Location: [facility];  Service: Orthopedics;  Laterality: Right;   TRANSCATHETER AORTIC VALVE REPLACEMENT, TRANSFEMORAL N/A 09/03/2018   Procedure: TRANSCATHETER AORTIC VALVE REPLACEMENT, TRANSFEMORAL;  Surgeon: Burnell Blanks, MD;  Location: [facility];  Service: Open Heart Surgery;  Laterality: N/A;    SOCIAL HISTORY: Social History   Socioeconomic History   Marital status: Widowed    Spouse name: Not on file   Number of children: 4   Years of education: Not on file   Highest education level: Master's degree (e.g., MA, MS, MEng, MEd, MSW, MBA)  Occupational History   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  Tobacco Use   Smoking status: Never   Smokeless tobacco: Never  Vaping Use   Vaping Use: Never used  Substance and Sexual Activity   Alcohol use: No   Drug use: No   Sexual activity: Not Currently  Other Topics Concern   Not on file  Social History Narrative   Not on file   Social Determinants of Health   Financial Resource Strain: Low Risk  (11/12/2018)   Overall Financial Resource Strain (CARDIA)    Difficulty of Paying Living Expenses: Not very hard  Food Insecurity: No Food Insecurity (11/12/2018)   Hunger Vital Sign    Worried About Running Out of Food in the Last Year: Never true    Ran Out of Food in the Last Year: Never true  Transportation Needs: No Transportation Needs (11/12/2018)   PRAPARE - Hydrologist (Medical): No    Lack of Transportation (Non-Medical): No  Physical Activity: Inactive (11/12/2018)   Exercise Vital Sign    Days of Exercise per Week: 0 days    Minutes of Exercise per Session: 0 min  Stress: Stress Concern Present (11/12/2018)   [place]    Feeling of Stress : To some extent  Social Connections: Not  on file  Intimate Partner Violence: Not on file    FAMILY HISTORY: Family History  Problem Relation Age of Onset   Diabetes Mother    Stroke Mother        Carotid artery disease   Heart disease Father        CAD   Coronary artery disease Father    Diabetes Sister     ALLERGIES:  is allergic to penicillins and sulfa antibiotics.  MEDICATIONS:  Current Outpatient Medications  Medication Sig Dispense Refill   acyclovir (ZOVIRAX) 400 MG tablet Take 400 mg by mouth 2 (two) times daily. Pt takes 1 tablet (400 mg total) by mouth 2 (two) times daily.     Artificial Tear Solution (SOOTHE XP) SOLN Place 1 drop into both eyes every evening.     aspirin EC 81 MG tablet Take 81 mg by mouth at bedtime. Swallow whole.     calcitonin, salmon, (MIACALCIN/FORTICAL)  200 UNIT/ACT nasal spray Place 1 spray into alternate nostrils daily.     carvedilol (COREG) 12.5 MG tablet Take 12.5 mg by mouth 2 (two) times daily with a meal.     Cholecalciferol (VITAMIN D3) 50 MCG (2000 UT) capsule Take 1 capsule (2,000 Units total) by mouth daily.     cyanocobalamin (VITAMIN B12) 1000 MCG tablet Take 1,000 mcg by mouth daily.     dapagliflozin propanediol (FARXIGA) 10 MG TABS tablet TAKE 1 TABLET BY MOUTH EVERY DAY 90 tablet 2   dexamethasone (DECADRON) 4 MG tablet Take 5 tablets (20 mg total) by mouth once a week. Take 20 mg (5 tablets) on day of multiple myeloma treatment 20 tablet 5   esomeprazole (NEXIUM) 20 MG capsule Take 20 mg by mouth daily as needed (Heartburn).     furosemide (LASIX) 20 MG tablet PLEASE SEE ATTACHED FOR DETAILED DIRECTIONS (Patient taking differently: Take 20 mg by mouth daily.) 90 tablet 1   gabapentin (NEURONTIN) 300 MG capsule Take 1 capsule (300 mg total) by mouth 2 (two) times daily. 60 capsule 2   lenalidomide (REVLIMID) 25 MG capsule Take 1 capsule (25 mg total) by mouth daily. Take for 14 days, then none for 7 days. Repeat every 21 days. [employer] Auth # [id number] Date Obtained  12/20/2022 14 capsule 0   levothyroxine (SYNTHROID, LEVOTHROID) 100 MCG tablet Take 100 mcg by mouth daily before breakfast.  2   losartan (COZAAR) 25 MG tablet TAKE 1 TABLET (25 MG TOTAL) BY MOUTH DAILY. 90 tablet 1   polyethylene glycol (MIRALAX / GLYCOLAX) 17 g packet Take 17 g by mouth daily as needed for mild constipation.     potassium chloride (KLOR-CON M10) 10 MEQ tablet TAKE 2 TABLETS BY MOUTH EVERY DAY AS NEEDED WITH FUROSEMIDE 180 tablet 1   pravastatin (PRAVACHOL) 40 MG tablet Take 40 mg by mouth every evening.     ferrous sulfate 325 (65 FE) MG tablet Take 325 mg by mouth daily with breakfast. (Patient not taking: Reported on 11/28/2022)     ondansetron (ZOFRAN) 8 MG tablet Take 1 tablet (8 mg total) by mouth every 8 (eight) hours as needed. (Patient not taking: Reported on 11/28/2022) 30 tablet 0   prochlorperazine (COMPAZINE) 10 MG tablet Take 1 tablet (10 mg total) by mouth every 6 (six) hours as needed for nausea or vomiting. (Patient not taking: Reported on 11/28/2022) 30 tablet 0   No current facility-administered medications for this visit.    REVIEW OF SYSTEMS:   Constitutional: ( - ) fevers, ( - )  chills , ( - ) night sweats Eyes: ( - ) blurriness of vision, ( - ) double vision, ( - ) watery eyes Ears, nose, mouth, throat, and face: ( - ) mucositis, ( - ) sore throat Respiratory: ( - ) cough, ( - ) dyspnea, ( - ) wheezes Cardiovascular: ( - ) palpitation, ( - ) chest discomfort, ( +) lower extremity swelling Gastrointestinal:  ( - ) nausea, ( - ) heartburn, ( - ) change in bowel habits Skin: ( - ) abnormal skin rashes Lymphatics: ( - ) new lymphadenopathy, ( - ) easy bruising Neurological: ( - ) numbness, ( - ) tingling, ( - ) new weaknesses Behavioral/Psych: ( - ) mood change, ( - ) new changes  All other systems were reviewed with the patient and are negative.  PHYSICAL EXAMINATION: ECOG PERFORMANCE STATUS: 1 - Symptomatic but completely ambulatory  Vitals:   12/26/22  [id number]  BP: (!) 125/58  Pulse: 68  Resp: 13  Temp: 98.1 F (36.7 C)  SpO2: 96%    Filed Weights   12/26/22 0824  Weight: 165 lb 11.2 oz (75.2 kg)    GENERAL: well appearing elderly Caucasian female, alert, no distress and comfortable SKIN: skin color, texture, turgor are normal, no rashes or significant lesions EYES: conjunctiva are pink and non-injected, sclera clear LUNGS: clear to auscultation and percussion with normal breathing effort HEART: regular rate & rhythm and no murmurs. +1 bilateral lower extremity edema starting below the knee to ankles.  Musculoskeletal: no cyanosis of digits and no clubbing  PSYCH: alert & oriented x 3, fluent speech NEURO: no focal motor/sensory deficits  LABORATORY DATA:  I have reviewed the data as listed    Latest Ref Rng & Units 12/26/2022    8:00 AM 12/18/2022    8:16 AM 12/11/2022   11:07 AM  CBC  WBC 4.0 - 10.5 K/uL 8.4  7.2  7.4   Hemoglobin 12.0 - 15.0 g/dL 12.7  13.1  13.6   Hematocrit 36.0 - 46.0 % 38.4  39.0  40.4   Platelets 150 - 400 K/uL 197  238  207        Latest Ref Rng & Units 12/26/2022    8:00 AM 12/18/2022    8:16 AM 12/11/2022   11:07 AM  CMP  Glucose 70 - 99 mg/dL 100  98  115   BUN 8 - 23 mg/dL '16  18  19   '$ Creatinine 0.44 - 1.00 mg/dL 0.64  0.59  0.63   Sodium 135 - 145 mmol/L 135  132  131   Potassium 3.5 - 5.1 mmol/L 3.9  3.7  4.1   Chloride 98 - 111 mmol/L 99  97  98   CO2 22 - 32 mmol/L '28  28  26   '$ Calcium 8.9 - 10.3 mg/dL 8.3  8.2  8.5   Total Protein 6.5 - 8.1 g/dL 6.0  6.0  6.7   Total Bilirubin 0.3 - 1.2 mg/dL 0.7  0.7  0.5   Alkaline Phos 38 - 126 U/L 66  64  64   AST 15 - 41 U/L '15  13  14   '$ ALT 0 - 44 U/L '9  10  10     '$ Lab Results  Component Value Date   MPROTEIN 1.0 (H) 11/28/2022   MPROTEIN 1.3 (H) 10/31/2022   MPROTEIN 2.2 (H) 09/25/2022   Lab Results  Component Value Date   KPAFRELGTCHN 10.3 11/28/2022   KPAFRELGTCHN 11.9 10/31/2022   KPAFRELGTCHN 8.7 09/25/2022   LAMBDASER 5.9  11/28/2022   LAMBDASER 7.9 10/31/2022   LAMBDASER 6.1 09/25/2022   KAPLAMBRATIO 1.75 (H) 11/28/2022   KAPLAMBRATIO 1.51 10/31/2022   KAPLAMBRATIO 1.43 09/25/2022    RADIOGRAPHIC STUDIES: No results found.  Beatty B Spidle 89 y.o. female with medical history significant for multiple myeloma who presents for a follow up visit.   After review of the labs, review the records, discussion with the patient the findings are most consistent with an IgG lambda multiple myeloma.  The patient meets the diagnostic criteria based on 60% plasma cells in the bone marrow and anemia.  Additionally the patient was noted to have vitamin B12 deficiency which is currently being treated as well.  Given her degree of anemia would recommend proceeding with VD chemotherapy and will add Revlimid once hemoglobin improves.  The patient and her daughter voiced understanding of the  plan moving forward.  # IgG Lambda Multiple Myeloma  --diagnosis of MM confirmed with bone marrow biopsy showing 60% plasma cells and anemia -- Bone survey shows no lytic lesions, kidney function is within normal limits.  --08/01/2022 was Cycle 1 Day 1 of Vd  Plan:  --Today is Cycle 7 Day 15 of Vd chemotherapy.  --Labs show white blood cell count 8.4, Hgb 12.7, MCV 88.1, Plt 197 --last M protein from 11/28/2022 trended down to 1.0 (4.9 prior to start of therapy). Normalized SFLC.  --at each visit will collect CBC, CMP, and LDH with monthly restaging labs SPEP and SFLC.  --RTC in 2 weeks with interval weekly treatment.   #Bilateral lower extremity edema: --Currently taking Lasix 20 mg daily. Advised to follow up with cardiologist, Dr. Einar Gip, to see if dose can be increased to 20 mg twice daily.  --Patient is currently taking potassium chloride 10 mEq twice daily. Advised to check with Dr. Einar Gip if dose needs to be increased if lasix dose increases.  --Continue to wear compression stockings.   # Normocytic  Anemia #Vitamin B12 Deficiency --Anemia likely driven by multiple myeloma, but may be component of vitamin B12 deficiency as well. --initial labs showed elevated MMA with B12 180 -- Continue vitamin B12 1000 mcg p.o. daily -- Continue to monitor  #Left leg/thigh neuropathy: --Currently on gabapentin 600 mg nightly and 300 mg in the morning --Encouraged to try water aerobics and stretches.  --Following with Dr. Dietrich Pates (ortho) to see if she is a candidate for steroid injections.  -- Patient evaluated by Dr. Mickeal Skinner thinks that this may be neuropathy worsened by her Velcade  #Supportive Care -- chemotherapy education complete -- port placement not required.  --Awaiting to start Zometa/Xgeva  -- zofran '8mg'$  q8H PRN and compazine '10mg'$  PO q6H for nausea -- acyclovir '400mg'$  PO BID for VCZ prophylaxis -- tylenol 1000 mg q8H PRN for back pain.   No orders of the defined types were placed in this encounter.   All questions were answered. The patient knows to call the clinic with any problems, questions or concerns.  A total of more than 30 minutes were spent on this encounter with face-to-face time and non-face-to-face time, including preparing to see the patient, ordering tests and/or medications, counseling the patient and coordination of care as outlined above.   [name] PA-C Dept of Hematology and [place] at [facility] Phone: [phone]     12/26/2022 9:04 AM

## 2022-12-26 NOTE — Patient Instructions (Signed)
[facility]  Discharge Instructions: Thank you for choosing [facility] to provide your oncology and hematology care.   If you have a lab appointment with the [facility], please go directly to the [facility] and check in at the registration area.   Wear comfortable clothing and clothing appropriate for easy access to any Portacath or PICC line.   We strive to give you quality time with your provider. You may need to reschedule your appointment if you arrive late (15 or more minutes).  Arriving late affects you and other patients whose appointments are after yours.  Also, if you miss three or more appointments without notifying the office, you may be dismissed from the clinic at the provider's discretion.      For prescription refill requests, have your pharmacy contact our office and allow 72 hours for refills to be completed.    Today you received the following chemotherapy and/or immunotherapy agents Velcade   To help prevent nausea and vomiting after your treatment, we encourage you to take your nausea medication as directed.  BELOW ARE SYMPTOMS THAT SHOULD BE REPORTED IMMEDIATELY: *FEVER GREATER THAN 100.4 F (38 C) OR HIGHER *CHILLS OR SWEATING *NAUSEA AND VOMITING THAT IS NOT CONTROLLED WITH YOUR NAUSEA MEDICATION *UNUSUAL SHORTNESS OF BREATH *UNUSUAL BRUISING OR BLEEDING *URINARY PROBLEMS (pain or burning when urinating, or frequent urination) *BOWEL PROBLEMS (unusual diarrhea, constipation, pain near the anus) TENDERNESS IN MOUTH AND THROAT WITH OR WITHOUT PRESENCE OF ULCERS (sore throat, sores in mouth, or a toothache) UNUSUAL RASH, SWELLING OR PAIN  UNUSUAL VAGINAL DISCHARGE OR ITCHING   Items with * indicate a potential emergency and should be followed up as soon as possible or go to the Emergency Department if any problems should occur.  Please show the CHEMOTHERAPY ALERT CARD or IMMUNOTHERAPY ALERT CARD at check-in to  the Emergency Department and triage nurse.  Should you have questions after your visit or need to cancel or reschedule your appointment, please contact [facility]  Dept: [phone]  and follow the prompts.  Office hours are 8:00 a.m. to 4:30 p.m. Monday - Friday. Please note that voicemails left after 4:00 p.m. may not be returned until the following business day.  We are closed weekends and major holidays. You have access to a nurse at all times for urgent questions. Please call the main number to the clinic Dept: [phone] and follow the prompts.   For any non-urgent questions, you may also contact your provider using MyChart. We now offer e-Visits for anyone 2 and older to request care online for non-urgent symptoms. For details visit [email or web address].   Also download the MyChart app! Go to the app store, search "MyChart", open the app, select [facility], and log in with your MyChart username and password.  Bortezomib Injection What is this medication? BORTEZOMIB (bor TEZ oh mib) treats lymphoma. It may also be used to treat multiple myeloma, a type of bone marrow cancer. It works by blocking a protein that causes cancer cells to grow and multiply. This helps to slow or stop the spread of cancer cells. This medicine may be used for other purposes; ask your health care provider or pharmacist if you have questions. COMMON BRAND NAME(S): Velcade What should I tell my care team before I take this medication? They need to know if you have any of these conditions: Dehydration Diabetes Heart disease Liver disease Tingling of the fingers  or toes or other nerve disorder An unusual or allergic reaction to bortezomib, other medications, foods, dyes, or preservatives If you or your partner are pregnant or trying to get pregnant Breastfeeding How should I use this medication? This medication is injected into a vein or under the skin. It is given by  your care team in a hospital or clinic setting. Talk to your care team about the use of this medication in children. Special care may be needed. Overdosage: If you think you have taken too much of this medicine contact a poison control center or emergency room at once. NOTE: This medicine is only for you. Do not share this medicine with others. What if I miss a dose? Keep appointments for follow-up doses. It is important not to miss your dose. Call your care team if you are unable to keep an appointment. What may interact with this medication? Ketoconazole Rifampin This list may not describe all possible interactions. Give your health care provider a list of all the medicines, herbs, non-prescription drugs, or dietary supplements you use. Also tell them if you smoke, drink alcohol, or use illegal drugs. Some items may interact with your medicine. What should I watch for while using this medication? Your condition will be monitored carefully while you are receiving this medication. You may need blood work while taking this medication. This medication may affect your coordination, reaction time, or judgment. Do not drive or operate machinery until you know how this medication affects you. Sit up or stand slowly to reduce the risk of dizzy or fainting spells. Drinking alcohol with this medication can increase the risk of these side effects. This medication may increase your risk of getting an infection. Call your care team for advice if you get a fever, chills, sore throat, or other symptoms of a cold or flu. Do not treat yourself. Try to avoid being around people who are sick. Check with your care team if you have severe diarrhea, nausea, and vomiting, or if you sweat a lot. The loss of too much body fluid may make it dangerous for you to take this medication. Talk to your care team if you may be pregnant. Serious birth defects can occur if you take this medication during pregnancy and for 7 months  after the last dose. You will need a negative pregnancy test before starting this medication. Contraception is recommended while taking this medication and for 7 months after the last dose. Your care team can help you find the option that works for you. If your partner can get pregnant, use a condom during sex while taking this medication and for 4 months after the last dose. Do not breastfeed while taking this medication and for 2 months after the last dose. This medication may cause infertility. Talk to your care team if you are concerned about your fertility. What side effects may I notice from receiving this medication? Side effects that you should report to your care team as soon as possible: Allergic reactions--skin rash, itching, hives, swelling of the face, lips, tongue, or throat Bleeding--bloody or black, tar-like stools, vomiting blood or brown material that looks like coffee grounds, red or dark brown urine, small red or purple spots on skin, unusual bruising or bleeding Bleeding in the brain--severe headache, stiff neck, confusion, dizziness, change in vision, numbness or weakness of the face, arm, or leg, trouble speaking, trouble walking, vomiting Bowel blockage--stomach cramping, unable to have a bowel movement or pass gas, loss of appetite, vomiting Heart  failure--shortness of breath, swelling of the ankles, feet, or hands, sudden weight gain, unusual weakness or fatigue Infection--fever, chills, cough, sore throat, wounds that don't heal, pain or trouble when passing urine, general feeling of discomfort or being unwell Liver injury--right upper belly pain, loss of appetite, nausea, light-colored stool, dark yellow or brown urine, yellowing skin or eyes, unusual weakness or fatigue Low blood pressure--dizziness, feeling faint or lightheaded, blurry vision Lung injury--shortness of breath or trouble breathing, cough, spitting up blood, chest pain, fever Pain, tingling, or numbness in  the hands or feet Severe or prolonged diarrhea Stomach pain, bloody diarrhea, pale skin, unusual weakness or fatigue, decrease in the amount of urine, which may be signs of hemolytic uremic syndrome Sudden and severe headache, confusion, change in vision, seizures, which may be signs of posterior reversible encephalopathy syndrome (PRES) TTP--purple spots on the skin or inside the mouth, pale skin, yellowing skin or eyes, unusual weakness or fatigue, fever, fast or irregular heartbeat, confusion, change in vision, trouble speaking, trouble walking Tumor lysis syndrome (TLS)--nausea, vomiting, diarrhea, decrease in the amount of urine, dark urine, unusual weakness or fatigue, confusion, muscle pain or cramps, fast or irregular heartbeat, joint pain Side effects that usually do not require medical attention (report to your care team if they continue or are bothersome): Constipation Diarrhea Fatigue Loss of appetite Nausea This list may not describe all possible side effects. Call your doctor for medical advice about side effects. You may report side effects to FDA at 1-800-FDA-1088. Where should I keep my medication? This medication is given in a hospital or clinic. It will not be stored at home. NOTE: This sheet is a summary. It may not cover all possible information. If you have questions about this medicine, talk to your doctor, pharmacist, or health care provider.  2023 Elsevier/Gold Standard (2022-03-08 00:00:00)

## 2022-12-26 NOTE — Telephone Encounter (Signed)
ICD-10-CM   1. Bilateral leg edema  R60.0 furosemide (LASIX) 40 MG tablet    potassium chloride (KLOR-CON M10) 10 MEQ tablet    2. Acute on chronic diastolic (congestive) heart failure (HCC)  I50.33 furosemide (LASIX) 40 MG tablet    potassium chloride (KLOR-CON M10) 10 MEQ tablet     Meds ordered this encounter  Medications   furosemide (LASIX) 40 MG tablet    Sig: Take 1 tablet (40 mg total) by mouth daily.    Dispense:  90 tablet    Refill:  1   potassium chloride (KLOR-CON M10) 10 MEQ tablet    Sig: Take 2 tablets (20 mEq total) by mouth 2 (two) times daily.    Dispense:  180 tablet    Refill:  1    Medications Discontinued During This Encounter  Medication Reason   furosemide (LASIX) 20 MG tablet Reorder   potassium chloride (KLOR-CON M10) 10 MEQ tablet Reorder

## 2022-12-27 ENCOUNTER — Other Ambulatory Visit: Payer: Self-pay

## 2022-12-27 LAB — KAPPA/LAMBDA LIGHT CHAINS
Kappa free light chain: 12.6 mg/L (ref 3.3–19.4)
Kappa, lambda light chain ratio: 1.33 (ref 0.26–1.65)
Lambda free light chains: 9.5 mg/L (ref 5.7–26.3)

## 2022-12-27 NOTE — Telephone Encounter (Signed)
Gave the patient this information. She has some concerns with increased urination with the increased lasix. She said she will call us if this becomes an issue for her.

## 2022-12-29 ENCOUNTER — Telehealth: Payer: Self-pay

## 2022-12-29 NOTE — Telephone Encounter (Signed)
Patient called to request that labs be faxed to PCP after lab and MD appointment on 01/01/23.

## 2022-12-31 ENCOUNTER — Other Ambulatory Visit: Payer: Self-pay | Admitting: Hematology and Oncology

## 2023-01-01 ENCOUNTER — Inpatient Hospital Stay: Payer: Medicare Other

## 2023-01-01 ENCOUNTER — Other Ambulatory Visit: Payer: Self-pay

## 2023-01-01 VITALS — BP 128/62 | HR 65 | Temp 97.7°F | Resp 18 | Wt 159.2 lb

## 2023-01-01 DIAGNOSIS — Z452 Encounter for adjustment and management of vascular access device: Secondary | ICD-10-CM | POA: Diagnosis not present

## 2023-01-01 DIAGNOSIS — R197 Diarrhea, unspecified: Secondary | ICD-10-CM | POA: Diagnosis not present

## 2023-01-01 DIAGNOSIS — D649 Anemia, unspecified: Secondary | ICD-10-CM | POA: Diagnosis not present

## 2023-01-01 DIAGNOSIS — C9000 Multiple myeloma not having achieved remission: Secondary | ICD-10-CM

## 2023-01-01 DIAGNOSIS — E871 Hypo-osmolality and hyponatremia: Secondary | ICD-10-CM | POA: Diagnosis not present

## 2023-01-01 DIAGNOSIS — G620 Drug-induced polyneuropathy: Secondary | ICD-10-CM | POA: Diagnosis not present

## 2023-01-01 DIAGNOSIS — Z5112 Encounter for antineoplastic immunotherapy: Secondary | ICD-10-CM | POA: Diagnosis not present

## 2023-01-01 DIAGNOSIS — E538 Deficiency of other specified B group vitamins: Secondary | ICD-10-CM | POA: Diagnosis not present

## 2023-01-01 DIAGNOSIS — R600 Localized edema: Secondary | ICD-10-CM | POA: Diagnosis not present

## 2023-01-01 DIAGNOSIS — R21 Rash and other nonspecific skin eruption: Secondary | ICD-10-CM | POA: Diagnosis not present

## 2023-01-01 LAB — CMP (CANCER CENTER ONLY)
ALT: 11 U/L (ref 0–44)
AST: 13 U/L — ABNORMAL LOW (ref 15–41)
Albumin: 3.7 g/dL (ref 3.5–5.0)
Alkaline Phosphatase: 60 U/L (ref 38–126)
Anion gap: 7 (ref 5–15)
BUN: 22 mg/dL (ref 8–23)
CO2: 27 mmol/L (ref 22–32)
Calcium: 8.9 mg/dL (ref 8.9–10.3)
Chloride: 98 mmol/L (ref 98–111)
Creatinine: 0.71 mg/dL (ref 0.44–1.00)
GFR, Estimated: >60 mL/min (ref >60)
Glucose, Bld: 116 mg/dL — ABNORMAL HIGH (ref 70–99)
Potassium: 4.3 mmol/L (ref 3.5–5.1)
Sodium: 132 mmol/L — ABNORMAL LOW (ref 135–145)
Total Bilirubin: 0.7 mg/dL (ref 0.3–1.2)
Total Protein: 6.7 g/dL (ref 6.5–8.1)

## 2023-01-01 LAB — CBC WITH DIFFERENTIAL (CANCER CENTER ONLY)
Abs Immature Granulocytes: 0.02 10*3/uL (ref 0.00–0.07)
Basophils Absolute: 0.1 10*3/uL (ref 0.0–0.1)
Basophils Relative: 1 %
Eosinophils Absolute: 0.1 10*3/uL (ref 0.0–0.5)
Eosinophils Relative: 2 %
HCT: 41.9 % (ref 36.0–46.0)
Hemoglobin: 14.2 g/dL (ref 12.0–15.0)
Immature Granulocytes: 0 %
Lymphocytes Relative: 9 %
Lymphs Abs: 0.6 10*3/uL — ABNORMAL LOW (ref 0.7–4.0)
MCH: 29.3 pg (ref 26.0–34.0)
MCHC: 33.9 g/dL (ref 30.0–36.0)
MCV: 86.4 fL (ref 80.0–100.0)
Monocytes Absolute: 0.7 10*3/uL (ref 0.1–1.0)
Monocytes Relative: 10 %
Neutro Abs: 5.4 10*3/uL (ref 1.7–7.7)
Neutrophils Relative %: 78 %
Platelet Count: 173 10*3/uL (ref 150–400)
RBC: 4.85 MIL/uL (ref 3.87–5.11)
RDW: 15.6 % — ABNORMAL HIGH (ref 11.5–15.5)
WBC Count: 6.9 10*3/uL (ref 4.0–10.5)
nRBC: 0 % (ref 0.0–0.2)

## 2023-01-01 MED ORDER — PROCHLORPERAZINE MALEATE 10 MG PO TABS: 10.0000 mg | ORAL_TABLET | Freq: Four times a day (QID) | ORAL | 0 refills | Status: DC | PRN

## 2023-01-01 MED ORDER — BORTEZOMIB CHEMO SQ INJECTION 3.5 MG (2.5MG/ML)
1.3000 mg/m2 | Freq: Once | INTRAMUSCULAR | Status: AC
  Administered 2023-01-01: 2.25 mg via SUBCUTANEOUS
  Filled 2023-01-01: qty 0.9

## 2023-01-01 NOTE — Progress Notes (Signed)
Pt c/o consistent pain in left leg. Pt states she has increased gabapentin and lasix and is urinating every 2 hours. Noted 6 lb weight loss in 7 days. Spoke with Murray Hodgkins, [place] who advised pt follow up with her orthopedist, Dr. Rolena Infante, regarding leg/knee pain. Pt states understanding. VSS and pt discharged in stable condition.

## 2023-01-01 NOTE — Patient Instructions (Signed)
[facility]  Discharge Instructions: Thank you for choosing [facility] to provide your oncology and hematology care.   If you have a lab appointment with the Cancer Center, please go directly to the Cancer Center and check in at the registration area.   Wear comfortable clothing and clothing appropriate for easy access to any Portacath or PICC line.   We strive to give you quality time with your provider. You may need to reschedule your appointment if you arrive late (15 or more minutes).  Arriving late affects you and other patients whose appointments are after yours.  Also, if you miss three or more appointments without notifying the office, you may be dismissed from the clinic at the provider's discretion.      For prescription refill requests, have your pharmacy contact our office and allow 72 hours for refills to be completed.    Today you received the following chemotherapy and/or immunotherapy agents velcade      To help prevent nausea and vomiting after your treatment, we encourage you to take your nausea medication as directed.  BELOW ARE SYMPTOMS THAT SHOULD BE REPORTED IMMEDIATELY: *FEVER GREATER THAN 100.4 F (38 C) OR HIGHER *CHILLS OR SWEATING *NAUSEA AND VOMITING THAT IS NOT CONTROLLED WITH YOUR NAUSEA MEDICATION *UNUSUAL SHORTNESS OF BREATH *UNUSUAL BRUISING OR BLEEDING *URINARY PROBLEMS (pain or burning when urinating, or frequent urination) *BOWEL PROBLEMS (unusual diarrhea, constipation, pain near the anus) TENDERNESS IN MOUTH AND THROAT WITH OR WITHOUT PRESENCE OF ULCERS (sore throat, sores in mouth, or a toothache) UNUSUAL RASH, SWELLING OR PAIN  UNUSUAL VAGINAL DISCHARGE OR ITCHING   Items with * indicate a potential emergency and should be followed up as soon as possible or go to the Emergency Department if any problems should occur.  Please show the CHEMOTHERAPY ALERT CARD or IMMUNOTHERAPY ALERT CARD at check-in  to the Emergency Department and triage nurse.  Should you have questions after your visit or need to cancel or reschedule your appointment, please contact [facility]  Dept: [phone]  and follow the prompts.  Office hours are 8:00 a.m. to 4:30 p.m. Monday - Friday. Please note that voicemails left after 4:00 p.m. may not be returned until the following business day.  We are closed weekends and major holidays. You have access to a nurse at all times for urgent questions. Please call the main number to the clinic Dept: [phone] and follow the prompts.   For any non-urgent questions, you may also contact your provider using MyChart. We now offer e-Visits for anyone 18 and older to request care online for non-urgent symptoms. For details visit [email or web address].   Also download the MyChart app! Go to the app store, search "MyChart", open the app, select [facility], and log in with your MyChart username and password.   

## 2023-01-02 LAB — MULTIPLE MYELOMA PANEL, SERUM
Albumin SerPl Elph-Mcnc: 3 g/dL (ref 2.9–4.4)
Albumin/Glob SerPl: 1.2 (ref 0.7–1.7)
Alpha 1: 0.2 g/dL (ref 0.0–0.4)
Alpha2 Glob SerPl Elph-Mcnc: 0.6 g/dL (ref 0.4–1.0)
B-Globulin SerPl Elph-Mcnc: 0.7 g/dL (ref 0.7–1.3)
Gamma Glob SerPl Elph-Mcnc: 1.1 g/dL (ref 0.4–1.8)
Globulin, Total: 2.6 g/dL (ref 2.2–3.9)
IgA: 46 mg/dL — ABNORMAL LOW (ref 64–422)
IgG (Immunoglobin G), Serum: 1227 mg/dL (ref 586–1602)
IgM (Immunoglobulin M), Srm: 25 mg/dL — ABNORMAL LOW (ref 26–217)
M Protein SerPl Elph-Mcnc: 0.7 g/dL — ABNORMAL HIGH
Total Protein ELP: 5.6 g/dL — ABNORMAL LOW (ref 6.0–8.5)

## 2023-01-03 DIAGNOSIS — M546 Pain in thoracic spine: Secondary | ICD-10-CM | POA: Diagnosis not present

## 2023-01-03 DIAGNOSIS — M545 Low back pain, unspecified: Secondary | ICD-10-CM | POA: Diagnosis not present

## 2023-01-03 DIAGNOSIS — M6281 Muscle weakness (generalized): Secondary | ICD-10-CM | POA: Diagnosis not present

## 2023-01-03 DIAGNOSIS — R2689 Other abnormalities of gait and mobility: Secondary | ICD-10-CM | POA: Diagnosis not present

## 2023-01-04 ENCOUNTER — Telehealth: Payer: Self-pay

## 2023-01-04 NOTE — Telephone Encounter (Signed)
T/C from [facility] and [place] requesting clearance for pt to have a dental treatment.  She needs clearance for a dental bridge to be prepped.

## 2023-01-08 ENCOUNTER — Telehealth: Payer: Self-pay | Admitting: Hematology and Oncology

## 2023-01-08 NOTE — Telephone Encounter (Signed)
Patient called to try and find earlier start time for treatment on 3/19. Left voicemail with new appointment times and contact details if needing to change again.

## 2023-01-08 NOTE — Progress Notes (Unsigned)
[place] Telephone:([phone]) [phone]   [phone]  PROGRESS NOTE  Patient Care Team: [facility] [place] as PCP - General (Rheumatology)  Hematological/Oncological History # IgG Lambda Multiple Myeloma  06/14/2022: establish care with [name] due to anemia. Labs showed M protein 4.9, Kappa 7.2, Lambda 10.8, ratio 0.67 07/13/2022: Bmbx showed Lambda restricted plasma cell neoplasm involving approximately 60% of the cellular marrow by IHC on the biopsy.  08/01/2022: Cycle 1 Day 1 of VRd chemotherapy. (Holding revlimid initially) 08/21/2022:  Cycle 2 Day 1 of VRd chemotherapy. (Holding revlimid) 09/04/2022: Cycle 3 Day 1 of VRd chemotherapy. Started revlimid 10/03/2022: Cycle 4 Day 1 of VRd chemotherapy 10/31/2022: Cycle 5 Day 1 of VRd chemotherapy 11/20/2022: Cycle 6 Day 1 of VRd chemotherapy 12/11/2022: Cycle 7 Day 1 of VRd chemotherapy 01/02/2023: Cycle 8 Day 1 of VRd chemotherapy  Interval History:  Zoe [name] 89 y.o. female with medical history significant for newly diagnosed multiple myeloma who presents for a follow up visit. The patient's last visit was on 12/11/2022. In the interim since the last visit she has continued VRd chemotherapy.   On exam today Zoe [name] reports her primary issue continues to be pain in her left leg.  She reports that she did not sleep well last night and is currently groggy.  She is taking 600 mg gabapentin once in the morning and once at night.  She reports that otherwise she is tolerating her VRD chemotherapy well.  She is having some rare neuropathy of her hands.  She is not having any trouble with nausea, vomiting, or diarrhea.  She is not having any injection site issues.  She is having some occasional issues with balance and is doing her best to try to work with water aerobics to increase her strength.. She denies fevers, chills, sweats, shortness of breath, chest pain or cough. She has no other complaints.   Full 10 point ROS was otherwise negative.  MEDICAL HISTORY:  Past Medical History:  Diagnosis Date   Arthritis    some - per patient   Breast cancer ([place])    breast cancer / left    Cataract    bilat    GERD (gastroesophageal reflux disease)    History of kidney stones    Hyperlipidemia    Hypertension    Hypothyroidism    Macular degeneration    Left   S/P TAVR (transcatheter aortic valve replacement) 09/03/2018   23 mm Edwards Sapien 3 transcatheter heart valve placed via percutaneous right transfemoral approach    Severe aortic stenosis    Stress incontinence    Thyroid disease    Tinnitus     SURGICAL HISTORY: Past Surgical History:  Procedure Laterality Date   ABDOMINAL HYSTERECTOMY  1970's   BACK SURGERY     BREAST LUMPECTOMY  12/1998   lumpectomy   CARDIAC CATHETERIZATION     EYE SURGERY     cataract surgery bilat    INTRAOPERATIVE TRANSTHORACIC ECHOCARDIOGRAM N/A 09/03/2018   Procedure: INTRAOPERATIVE TRANSTHORACIC ECHOCARDIOGRAM;  Surgeon: Burnell Blanks, MD;  Location: [place];  Service: Open Heart Surgery;  Laterality: N/A;   KYPHOPLASTY N/A 09/07/2022   Procedure: THORACIC EIGHT KYPHOPLASTY;  Surgeon: Melina Schools, MD;  Location: [place];  Service: Orthopedics;  Laterality: N/A;  1 hr Local with [facility] 3 C-Bed   LITHOTRIPSY     Right total knee     2018 Dr. Alvan Dame   RIGHT/LEFT HEART CATH AND CORONARY ANGIOGRAPHY N/A 08/06/2018  Procedure: RIGHT/LEFT HEART CATH AND CORONARY ANGIOGRAPHY;  Surgeon: Adrian Prows, MD;  Location: [facility];  Service: Cardiovascular;  Laterality: N/A;   THYROIDECTOMY, PARTIAL  1975   TONSILLECTOMY     as a child - patient not sure of exact date   TOTAL KNEE ARTHROPLASTY Left 03/13/2016   Procedure: TOTAL KNEE ARTHROPLASTY;  Surgeon: Paralee Cancel, MD;  Location: [facility];  Service: Orthopedics;  Laterality: Left;   TOTAL KNEE ARTHROPLASTY Right 06/18/2017   Procedure: RIGHT TOTAL KNEE ARTHROPLASTY;  Surgeon: Paralee Cancel, MD;  Location: [facility];  Service: Orthopedics;  Laterality: Right;   TRANSCATHETER AORTIC VALVE REPLACEMENT, TRANSFEMORAL N/A 09/03/2018   Procedure: TRANSCATHETER AORTIC VALVE REPLACEMENT, TRANSFEMORAL;  Surgeon: Burnell Blanks, MD;  Location: [facility] [place];  Service: Open Heart Surgery;  Laterality: N/A;    SOCIAL HISTORY: Social History   Socioeconomic History   Marital status: Widowed    Spouse name: Not on file   Number of children: 4   Years of education: Not on file   Highest education level: Master's degree (e.g., MA, MS, MEng, MEd, MSW, MBA)  Occupational History   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  Tobacco Use   Smoking status: Never   Smokeless tobacco: Never  Vaping Use   Vaping Use: Never used  Substance and Sexual Activity   Alcohol use: No   Drug use: No   Sexual activity: Not Currently  Other Topics Concern   Not on file  Social History Narrative   Not on file   Social Determinants of Health   Financial Resource Strain: Low Risk  (11/12/2018)   Overall Financial Resource Strain (CARDIA)    Difficulty of Paying Living Expenses: Not very hard  Food Insecurity: No Food Insecurity (11/12/2018)   Hunger Vital Sign    Worried About Running Out of Food in the Last Year: Never true    Ran Out of Food in the Last Year: Never true  Transportation Needs: No Transportation Needs (11/12/2018)   PRAPARE - Hydrologist (Medical): No    Lack of Transportation (Non-Medical): No  Physical Activity: Inactive (11/12/2018)   Exercise Vital Sign    Days of Exercise per Week: 0 days    Minutes of Exercise per Session: 0 min  Stress: Stress Concern Present (11/12/2018)   [place]    Feeling of Stress : To some extent  Social Connections: Not on file  Intimate Partner Violence: Not on file    FAMILY HISTORY: Family History  Problem  Relation Age of Onset   Diabetes Mother    Stroke Mother        Carotid artery disease   Heart disease Father        CAD   Coronary artery disease Father    Diabetes Sister     ALLERGIES:  is allergic to penicillins and sulfa antibiotics.  MEDICATIONS:  Current Outpatient Medications  Medication Sig Dispense Refill   acyclovir (ZOVIRAX) 400 MG tablet Take 400 mg by mouth 2 (two) times daily. Pt takes 1 tablet (400 mg total) by mouth 2 (two) times daily.     Artificial Tear Solution (SOOTHE XP) SOLN Place 1 drop into both eyes every evening.     aspirin EC 81 MG tablet Take 81 mg by mouth at bedtime. Swallow whole.     calcitonin, salmon, (MIACALCIN/FORTICAL) 200 UNIT/ACT nasal spray Place 1 spray into alternate nostrils  daily.     carvedilol (COREG) 12.5 MG tablet Take 12.5 mg by mouth 2 (two) times daily with a meal.     Cholecalciferol (VITAMIN D3) 50 MCG (2000 UT) capsule Take 1 capsule (2,000 Units total) by mouth daily.     cyanocobalamin (VITAMIN B12) 1000 MCG tablet Take 1,000 mcg by mouth daily.     dapagliflozin propanediol (FARXIGA) 10 MG TABS tablet TAKE 1 TABLET BY MOUTH EVERY DAY 90 tablet 2   dexamethasone (DECADRON) 4 MG tablet Take 5 tablets (20 mg total) by mouth once a week. Take 20 mg (5 tablets) on day of multiple myeloma treatment 20 tablet 5   esomeprazole (NEXIUM) 20 MG capsule Take 20 mg by mouth daily as needed (Heartburn).     ferrous sulfate 325 (65 FE) MG tablet Take 325 mg by mouth daily with breakfast. (Patient not taking: Reported on 11/28/2022)     furosemide (LASIX) 40 MG tablet Take 1 tablet (40 mg total) by mouth daily. 90 tablet 1   gabapentin (NEURONTIN) 300 MG capsule Take 1 capsule (300 mg total) by mouth 2 (two) times daily. 60 capsule 2   lenalidomide (REVLIMID) 25 MG capsule Take 1 capsule (25 mg total) by mouth daily. Take for 14 days, then none for 7 days. Repeat every 21 days. [employer] Auth # [id number] Date Obtained 12/20/2022 14 capsule 0    levothyroxine (SYNTHROID, LEVOTHROID) 100 MCG tablet Take 100 mcg by mouth daily before breakfast.  2   losartan (COZAAR) 25 MG tablet TAKE 1 TABLET (25 MG TOTAL) BY MOUTH DAILY. 90 tablet 1   ondansetron (ZOFRAN) 8 MG tablet Take 1 tablet (8 mg total) by mouth every 8 (eight) hours as needed. (Patient not taking: Reported on 11/28/2022) 30 tablet 0   polyethylene glycol (MIRALAX / GLYCOLAX) 17 g packet Take 17 g by mouth daily as needed for mild constipation.     potassium chloride (KLOR-CON M10) 10 MEQ tablet Take 2 tablets (20 mEq total) by mouth 2 (two) times daily. 180 tablet 1   pravastatin (PRAVACHOL) 40 MG tablet Take 40 mg by mouth every evening.     prochlorperazine (COMPAZINE) 10 MG tablet Take 1 tablet (10 mg total) by mouth every 6 (six) hours as needed for nausea or vomiting. 30 tablet 0   No current facility-administered medications for this visit.    REVIEW OF SYSTEMS:   Constitutional: ( - ) fevers, ( - )  chills , ( - ) night sweats Eyes: ( - ) blurriness of vision, ( - ) double vision, ( - ) watery eyes Ears, nose, mouth, throat, and face: ( - ) mucositis, ( - ) sore throat Respiratory: ( - ) cough, ( - ) dyspnea, ( - ) wheezes Cardiovascular: ( - ) palpitation, ( - ) chest discomfort, ( +) lower extremity swelling Gastrointestinal:  ( - ) nausea, ( - ) heartburn, ( - ) change in bowel habits Skin: ( - ) abnormal skin rashes Lymphatics: ( - ) new lymphadenopathy, ( - ) easy bruising Neurological: ( - ) numbness, ( - ) tingling, ( - ) new weaknesses Behavioral/Psych: ( - ) mood change, ( - ) new changes  All other systems were reviewed with the patient and are negative.  PHYSICAL EXAMINATION: ECOG PERFORMANCE STATUS: 1 - Symptomatic but completely ambulatory  There were no vitals filed for this visit.   There were no vitals filed for this visit.   GENERAL: well appearing elderly Caucasian female, alert, no  distress and comfortable SKIN: skin color, texture, turgor  are normal, no rashes or significant lesions EYES: conjunctiva are pink and non-injected, sclera clear LUNGS: clear to auscultation and percussion with normal breathing effort HEART: regular rate & rhythm and no murmurs. +1 bilateral lower extremity edema starting below the knee to ankles.  Musculoskeletal: no cyanosis of digits and no clubbing  PSYCH: alert & oriented x 3, fluent speech NEURO: no focal motor/sensory deficits  LABORATORY DATA:  I have reviewed the data as listed    Latest Ref Rng & Units 01/09/2023    8:59 AM 01/01/2023    8:22 AM 12/26/2022    8:00 AM  CBC  WBC 4.0 - 10.5 K/uL 6.8  6.9  8.4   Hemoglobin 12.0 - 15.0 g/dL 13.5  14.2  12.7   Hematocrit 36.0 - 46.0 % 40.1  41.9  38.4   Platelets 150 - 400 K/uL 223  173  197        Latest Ref Rng & Units 01/09/2023    8:59 AM 01/01/2023    8:22 AM 12/26/2022    8:00 AM  CMP  Glucose 70 - 99 mg/dL 128  116  100   BUN 8 - 23 mg/dL 31  22  16    Creatinine 0.44 - 1.00 mg/dL 0.84  0.71  0.64   Sodium 135 - 145 mmol/L 131  132  135   Potassium 3.5 - 5.1 mmol/L 4.4  4.3  3.9   Chloride 98 - 111 mmol/L 96  98  99   CO2 22 - 32 mmol/L 28  27  28    Calcium 8.9 - 10.3 mg/dL 8.8  8.9  8.3   Total Protein 6.5 - 8.1 g/dL 6.2  6.7  6.0   Total Bilirubin 0.3 - 1.2 mg/dL 0.8  0.7  0.7   Alkaline Phos 38 - 126 U/L 66  60  66   AST 15 - 41 U/L 14  13  15    ALT 0 - 44 U/L 9  11  9      Lab Results  Component Value Date   MPROTEIN 0.7 (H) 12/26/2022   MPROTEIN 1.0 (H) 11/28/2022   MPROTEIN 1.3 (H) 10/31/2022   Lab Results  Component Value Date   KPAFRELGTCHN 12.6 12/26/2022   KPAFRELGTCHN 10.3 11/28/2022   KPAFRELGTCHN 11.9 10/31/2022   LAMBDASER 9.5 12/26/2022   LAMBDASER 5.9 11/28/2022   LAMBDASER 7.9 10/31/2022   KAPLAMBRATIO 1.33 12/26/2022   KAPLAMBRATIO 1.75 (H) 11/28/2022   KAPLAMBRATIO 1.51 10/31/2022    RADIOGRAPHIC STUDIES: No results found.  Zoe [name] 89 y.o. female with medical  history significant for multiple myeloma who presents for a follow up visit.   After review of the labs, review the records, discussion with the patient the findings are most consistent with an IgG lambda multiple myeloma.  The patient meets the diagnostic criteria based on 60% plasma cells in the bone marrow and anemia.  Additionally the patient was noted to have vitamin B12 deficiency which is currently being treated as well.  Given her degree of anemia would recommend proceeding with VD chemotherapy and will add Revlimid once hemoglobin improves.  The patient and her daughter voiced understanding of the plan moving forward.  # IgG Lambda Multiple Myeloma  --diagnosis of MM confirmed with bone marrow biopsy showing 60% plasma cells and anemia -- Bone survey shows no lytic lesions, kidney function is within normal limits.  --08/01/2022 was Cycle 1 Day  1 of Vd  Plan:  --Today is Cycle 8 Day 8 of Vd chemotherapy.  --Labs show white blood cell count 6.8, hemoglobin 13.5, MCV 86.8, and platelets of 223 --last M protein from 11/28/2022 trended down to 0.7 (4.9 prior to start of therapy). Normalized SFLC.  --at each visit will collect CBC, CMP, and LDH with monthly restaging labs SPEP and SFLC.  --RTC in 2 weeks with interval weekly treatment.   #Bilateral lower extremity edema: --Currently taking Lasix 20 mg BID. Advised to follow up with cardiologist, Dr. Einar Gip regarding dosing.  --Patient is currently taking potassium chloride 10 mEq twice daily.  --Continue to wear compression stockings.   # Normocytic Anemia #Vitamin B12 Deficiency --Anemia likely driven by multiple myeloma, but may be component of vitamin B12 deficiency as well. --initial labs showed elevated MMA with B12 180 -- Continue vitamin B12 1000 mcg p.o. daily -- Continue to monitor  #Left leg/thigh neuropathy: --Currently on gabapentin 900 mg nightly and 600 mg in the morning --Encouraged to try water aerobics and stretches.   --Following with Dr. Dietrich Pates (ortho) to see if she is a candidate for steroid injections.  -- Patient evaluated by Dr. Mickeal Skinner thinks that this may be neuropathy worsened by her Velcade  #Supportive Care -- chemotherapy education complete -- port placement not required.  --Awaiting to start Zometa/Xgeva  -- zofran 8mg  q8H PRN and compazine 10mg  PO q6H for nausea -- acyclovir 400mg  PO BID for VCZ prophylaxis -- tylenol 1000 mg q8H PRN for back pain.   No orders of the defined types were placed in this encounter.   All questions were answered. The patient knows to call the clinic with any problems, questions or concerns.  A total of more than 30 minutes were spent on this encounter with face-to-face time and non-face-to-face time, including preparing to see the patient, ordering tests and/or medications, counseling the patient and coordination of care as outlined above.   Ledell Peoples, MD Department of Hematology/Oncology [place] at [facility] Phone: [phone] Pager: [phone] Email: [email or web address]   01/09/2023 3:21 PM

## 2023-01-09 ENCOUNTER — Inpatient Hospital Stay: Payer: Self-pay | Attending: Hematology and Oncology

## 2023-01-09 ENCOUNTER — Inpatient Hospital Stay: Payer: Medicare Other

## 2023-01-09 ENCOUNTER — Inpatient Hospital Stay: Payer: Medicare Other | Admitting: Hematology and Oncology

## 2023-01-09 ENCOUNTER — Inpatient Hospital Stay: Payer: Medicare Other | Admitting: Physician Assistant

## 2023-01-09 VITALS — BP 106/53 | HR 63 | Temp 98.2°F | Resp 16 | Ht 63.0 in | Wt 159.0 lb

## 2023-01-09 DIAGNOSIS — Z4889 Encounter for other specified surgical aftercare: Secondary | ICD-10-CM | POA: Diagnosis not present

## 2023-01-09 DIAGNOSIS — C9000 Multiple myeloma not having achieved remission: Secondary | ICD-10-CM | POA: Diagnosis not present

## 2023-01-09 DIAGNOSIS — E538 Deficiency of other specified B group vitamins: Secondary | ICD-10-CM | POA: Diagnosis not present

## 2023-01-09 DIAGNOSIS — T451X5A Adverse effect of antineoplastic and immunosuppressive drugs, initial encounter: Secondary | ICD-10-CM | POA: Diagnosis not present

## 2023-01-09 DIAGNOSIS — R197 Diarrhea, unspecified: Secondary | ICD-10-CM | POA: Diagnosis not present

## 2023-01-09 DIAGNOSIS — R600 Localized edema: Secondary | ICD-10-CM | POA: Diagnosis not present

## 2023-01-09 DIAGNOSIS — D649 Anemia, unspecified: Secondary | ICD-10-CM | POA: Diagnosis not present

## 2023-01-09 DIAGNOSIS — Z5112 Encounter for antineoplastic immunotherapy: Secondary | ICD-10-CM | POA: Diagnosis not present

## 2023-01-09 DIAGNOSIS — G620 Drug-induced polyneuropathy: Secondary | ICD-10-CM | POA: Diagnosis not present

## 2023-01-09 DIAGNOSIS — R21 Rash and other nonspecific skin eruption: Secondary | ICD-10-CM | POA: Diagnosis not present

## 2023-01-09 DIAGNOSIS — Z452 Encounter for adjustment and management of vascular access device: Secondary | ICD-10-CM | POA: Diagnosis not present

## 2023-01-09 DIAGNOSIS — E871 Hypo-osmolality and hyponatremia: Secondary | ICD-10-CM | POA: Diagnosis not present

## 2023-01-09 LAB — CMP (CANCER CENTER ONLY)
ALT: 9 U/L (ref 0–44)
AST: 14 U/L — ABNORMAL LOW (ref 15–41)
Albumin: 3.5 g/dL (ref 3.5–5.0)
Alkaline Phosphatase: 66 U/L (ref 38–126)
Anion gap: 7 (ref 5–15)
BUN: 31 mg/dL — ABNORMAL HIGH (ref 8–23)
CO2: 28 mmol/L (ref 22–32)
Calcium: 8.8 mg/dL — ABNORMAL LOW (ref 8.9–10.3)
Chloride: 96 mmol/L — ABNORMAL LOW (ref 98–111)
Creatinine: 0.84 mg/dL (ref 0.44–1.00)
GFR, Estimated: >60 mL/min (ref >60)
Glucose, Bld: 128 mg/dL — ABNORMAL HIGH (ref 70–99)
Potassium: 4.4 mmol/L (ref 3.5–5.1)
Sodium: 131 mmol/L — ABNORMAL LOW (ref 135–145)
Total Bilirubin: 0.8 mg/dL (ref 0.3–1.2)
Total Protein: 6.2 g/dL — ABNORMAL LOW (ref 6.5–8.1)

## 2023-01-09 LAB — CBC WITH DIFFERENTIAL (CANCER CENTER ONLY)
Abs Immature Granulocytes: 0.03 10*3/uL (ref 0.00–0.07)
Basophils Absolute: 0.1 10*3/uL (ref 0.0–0.1)
Basophils Relative: 1 %
Eosinophils Absolute: 0.3 10*3/uL (ref 0.0–0.5)
Eosinophils Relative: 4 %
HCT: 40.1 % (ref 36.0–46.0)
Hemoglobin: 13.5 g/dL (ref 12.0–15.0)
Immature Granulocytes: 0 %
Lymphocytes Relative: 7 %
Lymphs Abs: 0.5 10*3/uL — ABNORMAL LOW (ref 0.7–4.0)
MCH: 29.2 pg (ref 26.0–34.0)
MCHC: 33.7 g/dL (ref 30.0–36.0)
MCV: 86.8 fL (ref 80.0–100.0)
Monocytes Absolute: 1 10*3/uL (ref 0.1–1.0)
Monocytes Relative: 14 %
Neutro Abs: 5 10*3/uL (ref 1.7–7.7)
Neutrophils Relative %: 74 %
Platelet Count: 223 10*3/uL (ref 150–400)
RBC: 4.62 MIL/uL (ref 3.87–5.11)
RDW: 16.4 % — ABNORMAL HIGH (ref 11.5–15.5)
WBC Count: 6.8 10*3/uL (ref 4.0–10.5)
nRBC: 0 % (ref 0.0–0.2)

## 2023-01-09 MED ORDER — BORTEZOMIB CHEMO SQ INJECTION 3.5 MG (2.5MG/ML)
1.3000 mg/m2 | Freq: Once | INTRAMUSCULAR | Status: AC
  Administered 2023-01-09: 2.25 mg via SUBCUTANEOUS
  Filled 2023-01-09: qty 0.9

## 2023-01-09 NOTE — Progress Notes (Signed)
Patient states she took her Dexamethasone prior to appointment today.

## 2023-01-09 NOTE — Patient Instructions (Signed)
Zoe [name]  Discharge Instructions: Thank you for choosing [facility] to provide your oncology and hematology care.   If you have a lab appointment with the [facility], please go directly to the [facility] and check in at the registration area.   Wear comfortable clothing and clothing appropriate for easy access to any Portacath or PICC line.   We strive to give you quality time with your provider. You may need to reschedule your appointment if you arrive late (15 or more minutes).  Arriving late affects you and other patients whose appointments are after yours.  Also, if you miss three or more appointments without notifying the office, you may be dismissed from the clinic at the provider's discretion.      For prescription refill requests, have your pharmacy contact our office and allow 72 hours for refills to be completed.    Today you received the following chemotherapy and/or immunotherapy agents Velcade      To help prevent nausea and vomiting after your treatment, we encourage you to take your nausea medication as directed.  BELOW ARE SYMPTOMS THAT SHOULD BE REPORTED IMMEDIATELY: *FEVER GREATER THAN 100.4 F (38 C) OR HIGHER *CHILLS OR SWEATING *NAUSEA AND VOMITING THAT IS NOT CONTROLLED WITH YOUR NAUSEA MEDICATION *UNUSUAL SHORTNESS OF BREATH *UNUSUAL BRUISING OR BLEEDING *URINARY PROBLEMS (pain or burning when urinating, or frequent urination) *BOWEL PROBLEMS (unusual diarrhea, constipation, pain near the anus) TENDERNESS IN MOUTH AND THROAT WITH OR WITHOUT PRESENCE OF ULCERS (sore throat, sores in mouth, or a toothache) UNUSUAL RASH, SWELLING OR PAIN  UNUSUAL VAGINAL DISCHARGE OR ITCHING   Items with * indicate a potential emergency and should be followed up as soon as possible or go to the Emergency Department if any problems should occur.  Please show the CHEMOTHERAPY ALERT CARD or IMMUNOTHERAPY ALERT CARD at check-in  to the Emergency Department and triage nurse.  Should you have questions after your visit or need to cancel or reschedule your appointment, please contact [facility]  Dept: [phone]  and follow the prompts.  Office hours are 8:00 a.m. to 4:30 p.m. Monday - Friday. Please note that voicemails left after 4:00 p.m. may not be returned until the following business day.  We are closed weekends and major holidays. You have access to a nurse at all times for urgent questions. Please call the main number to the clinic Dept: [phone] and follow the prompts.   For any non-urgent questions, you may also contact your provider using MyChart. We now offer e-Visits for anyone 32 and older to request care online for non-urgent symptoms. For details visit [email or web address].   Also download the MyChart app! Go to the app store, search "MyChart", open the app, select [facility], and log in with your MyChart username and password.  Bortezomib Injection (Velcade) What is this medication? BORTEZOMIB (bor TEZ oh mib) treats lymphoma. It may also be used to treat multiple myeloma, a type of bone marrow cancer. It works by blocking a protein that causes cancer cells to grow and multiply. This helps to slow or stop the spread of cancer cells. This medicine may be used for other purposes; ask your health care provider or pharmacist if you have questions. COMMON BRAND NAME(S): Velcade What should I tell my care team before I take this medication? They need to know if you have any of these conditions: Dehydration Diabetes Heart disease Liver disease  Tingling of the fingers or toes or other nerve disorder An unusual or allergic reaction to bortezomib, other medications, foods, dyes, or preservatives If you or your partner are pregnant or trying to get pregnant Breastfeeding How should I use this medication? This medication is injected into a vein or under the skin. It  is given by your care team in a hospital or clinic setting. Talk to your care team about the use of this medication in children. Special care may be needed. Overdosage: If you think you have taken too much of this medicine contact a poison control center or emergency room at once. NOTE: This medicine is only for you. Do not share this medicine with others. What if I miss a dose? Keep appointments for follow-up doses. It is important not to miss your dose. Call your care team if you are unable to keep an appointment. What may interact with this medication? Ketoconazole Rifampin This list may not describe all possible interactions. Give your health care provider a list of all the medicines, herbs, non-prescription drugs, or dietary supplements you use. Also tell them if you smoke, drink alcohol, or use illegal drugs. Some items may interact with your medicine. What should I watch for while using this medication? Your condition will be monitored carefully while you are receiving this medication. You may need blood work while taking this medication. This medication may affect your coordination, reaction time, or judgment. Do not drive or operate machinery until you know how this medication affects you. Sit up or stand slowly to reduce the risk of dizzy or fainting spells. Drinking alcohol with this medication can increase the risk of these side effects. This medication may increase your risk of getting an infection. Call your care team for advice if you get a fever, chills, sore throat, or other symptoms of a cold or flu. Do not treat yourself. Try to avoid being around people who are sick. Check with your care team if you have severe diarrhea, nausea, and vomiting, or if you sweat a lot. The loss of too much body fluid may make it dangerous for you to take this medication. Talk to your care team if you may be pregnant. Serious birth defects can occur if you take this medication during pregnancy and for 7  months after the last dose. You will need a negative pregnancy test before starting this medication. Contraception is recommended while taking this medication and for 7 months after the last dose. Your care team can help you find the option that works for you. If your partner can get pregnant, use a condom during sex while taking this medication and for 4 months after the last dose. Do not breastfeed while taking this medication and for 2 months after the last dose. This medication may cause infertility. Talk to your care team if you are concerned about your fertility. What side effects may I notice from receiving this medication? Side effects that you should report to your care team as soon as possible: Allergic reactions--skin rash, itching, hives, swelling of the face, lips, tongue, or throat Bleeding--bloody or black, tar-like stools, vomiting blood or brown material that looks like coffee grounds, red or dark brown urine, small red or purple spots on skin, unusual bruising or bleeding Bleeding in the brain--severe headache, stiff neck, confusion, dizziness, change in vision, numbness or weakness of the face, arm, or leg, trouble speaking, trouble walking, vomiting Bowel blockage--stomach cramping, unable to have a bowel movement or pass gas, loss  of appetite, vomiting Heart failure--shortness of breath, swelling of the ankles, feet, or hands, sudden weight gain, unusual weakness or fatigue Infection--fever, chills, cough, sore throat, wounds that don't heal, pain or trouble when passing urine, general feeling of discomfort or being unwell Liver injury--right upper belly pain, loss of appetite, nausea, light-colored stool, dark yellow or brown urine, yellowing skin or eyes, unusual weakness or fatigue Low blood pressure--dizziness, feeling faint or lightheaded, blurry vision Lung injury--shortness of breath or trouble breathing, cough, spitting up blood, chest pain, fever Pain, tingling, or  numbness in the hands or feet Severe or prolonged diarrhea Stomach pain, bloody diarrhea, pale skin, unusual weakness or fatigue, decrease in the amount of urine, which may be signs of hemolytic uremic syndrome Sudden and severe headache, confusion, change in vision, seizures, which may be signs of posterior reversible encephalopathy syndrome (PRES) TTP--purple spots on the skin or inside the mouth, pale skin, yellowing skin or eyes, unusual weakness or fatigue, fever, fast or irregular heartbeat, confusion, change in vision, trouble speaking, trouble walking Tumor lysis syndrome (TLS)--nausea, vomiting, diarrhea, decrease in the amount of urine, dark urine, unusual weakness or fatigue, confusion, muscle pain or cramps, fast or irregular heartbeat, joint pain Side effects that usually do not require medical attention (report to your care team if they continue or are bothersome): Constipation Diarrhea Fatigue Loss of appetite Nausea This list may not describe all possible side effects. Call your doctor for medical advice about side effects. You may report side effects to FDA at 1-800-FDA-1088. Where should I keep my medication? This medication is given in a hospital or clinic. It will not be stored at home. NOTE: This sheet is a summary. It may not cover all possible information. If you have questions about this medicine, talk to your doctor, pharmacist, or health care provider.  2023 [employer]/Gold Standard (2022-03-08 00:00:00)

## 2023-01-10 ENCOUNTER — Other Ambulatory Visit: Payer: Self-pay | Admitting: Hematology and Oncology

## 2023-01-10 ENCOUNTER — Telehealth: Payer: Self-pay

## 2023-01-10 MED ORDER — GABAPENTIN 300 MG PO CAPS: 900.0000 mg | ORAL_CAPSULE | Freq: Two times a day (BID) | ORAL | 1 refills | Status: DC

## 2023-01-10 NOTE — Telephone Encounter (Signed)
T/C from pt stating pt's dose of gabapentin was changed at her last OV and she needs a new rx sent to [facility] 600 mg am 900 mg Pm  New rx sent to [facility] by Orson Slick, MD.  Pt advised

## 2023-01-15 ENCOUNTER — Inpatient Hospital Stay: Payer: Medicare Other

## 2023-01-15 ENCOUNTER — Other Ambulatory Visit: Payer: Self-pay

## 2023-01-15 ENCOUNTER — Other Ambulatory Visit: Payer: Self-pay | Admitting: *Deleted

## 2023-01-15 VITALS — BP 114/57 | HR 63 | Temp 97.9°F | Resp 16 | Wt 158.2 lb

## 2023-01-15 DIAGNOSIS — C9000 Multiple myeloma not having achieved remission: Secondary | ICD-10-CM

## 2023-01-15 DIAGNOSIS — E871 Hypo-osmolality and hyponatremia: Secondary | ICD-10-CM | POA: Diagnosis not present

## 2023-01-15 DIAGNOSIS — Z452 Encounter for adjustment and management of vascular access device: Secondary | ICD-10-CM | POA: Diagnosis not present

## 2023-01-15 DIAGNOSIS — Z5112 Encounter for antineoplastic immunotherapy: Secondary | ICD-10-CM | POA: Diagnosis not present

## 2023-01-15 DIAGNOSIS — D649 Anemia, unspecified: Secondary | ICD-10-CM | POA: Diagnosis not present

## 2023-01-15 DIAGNOSIS — R21 Rash and other nonspecific skin eruption: Secondary | ICD-10-CM | POA: Diagnosis not present

## 2023-01-15 DIAGNOSIS — E538 Deficiency of other specified B group vitamins: Secondary | ICD-10-CM | POA: Diagnosis not present

## 2023-01-15 DIAGNOSIS — R600 Localized edema: Secondary | ICD-10-CM | POA: Diagnosis not present

## 2023-01-15 DIAGNOSIS — R197 Diarrhea, unspecified: Secondary | ICD-10-CM | POA: Diagnosis not present

## 2023-01-15 DIAGNOSIS — G620 Drug-induced polyneuropathy: Secondary | ICD-10-CM | POA: Diagnosis not present

## 2023-01-15 LAB — CBC WITH DIFFERENTIAL (CANCER CENTER ONLY)
Abs Immature Granulocytes: 0.04 10*3/uL (ref 0.00–0.07)
Basophils Absolute: 0.1 10*3/uL (ref 0.0–0.1)
Basophils Relative: 1 %
Eosinophils Absolute: 0.4 10*3/uL (ref 0.0–0.5)
Eosinophils Relative: 6 %
HCT: 40.9 % (ref 36.0–46.0)
Hemoglobin: 13.9 g/dL (ref 12.0–15.0)
Immature Granulocytes: 1 %
Lymphocytes Relative: 10 %
Lymphs Abs: 0.6 10*3/uL — ABNORMAL LOW (ref 0.7–4.0)
MCH: 29.6 pg (ref 26.0–34.0)
MCHC: 34 g/dL (ref 30.0–36.0)
MCV: 87 fL (ref 80.0–100.0)
Monocytes Absolute: 0.6 10*3/uL (ref 0.1–1.0)
Monocytes Relative: 9 %
Neutro Abs: 4.5 10*3/uL (ref 1.7–7.7)
Neutrophils Relative %: 73 %
Platelet Count: 166 10*3/uL (ref 150–400)
RBC: 4.7 MIL/uL (ref 3.87–5.11)
RDW: 16.1 % — ABNORMAL HIGH (ref 11.5–15.5)
WBC Count: 6.2 10*3/uL (ref 4.0–10.5)
nRBC: 0.3 % — ABNORMAL HIGH (ref 0.0–0.2)

## 2023-01-15 LAB — CMP (CANCER CENTER ONLY)
ALT: 11 U/L (ref 0–44)
AST: 14 U/L — ABNORMAL LOW (ref 15–41)
Albumin: 3.4 g/dL — ABNORMAL LOW (ref 3.5–5.0)
Alkaline Phosphatase: 55 U/L (ref 38–126)
Anion gap: 8 (ref 5–15)
BUN: 42 mg/dL — ABNORMAL HIGH (ref 8–23)
CO2: 26 mmol/L (ref 22–32)
Calcium: 8.7 mg/dL — ABNORMAL LOW (ref 8.9–10.3)
Chloride: 97 mmol/L — ABNORMAL LOW (ref 98–111)
Creatinine: 0.92 mg/dL (ref 0.44–1.00)
GFR, Estimated: >60 mL/min (ref >60)
Glucose, Bld: 88 mg/dL (ref 70–99)
Potassium: 4.9 mmol/L (ref 3.5–5.1)
Sodium: 131 mmol/L — ABNORMAL LOW (ref 135–145)
Total Bilirubin: 0.8 mg/dL (ref 0.3–1.2)
Total Protein: 5.6 g/dL — ABNORMAL LOW (ref 6.5–8.1)

## 2023-01-15 MED ORDER — DEXAMETHASONE 4 MG PO TABS
8.0000 mg | ORAL_TABLET | Freq: Once | ORAL | Status: AC
  Administered 2023-01-15: 8 mg via ORAL
  Filled 2023-01-15: qty 2

## 2023-01-15 MED ORDER — SODIUM CHLORIDE 0.9 % IV SOLN: Freq: Once | INTRAVENOUS | Status: AC

## 2023-01-15 MED ORDER — BORTEZOMIB CHEMO SQ INJECTION 3.5 MG (2.5MG/ML)
1.3000 mg/m2 | Freq: Once | INTRAMUSCULAR | Status: AC
  Administered 2023-01-15: 2.25 mg via SUBCUTANEOUS
  Filled 2023-01-15: qty 0.9

## 2023-01-15 NOTE — Progress Notes (Signed)
Patient had been feeling poorly- "wooziness" and generally not good. No nausea or vomiting. Sodium low, but stable. BP 98/65. Dr. Lorenso Courier consulted and ok to treat today. Patient needed the remainder of her steroid dose due to having run out. Refilled and remainder given prior to velcade dose. IVF run, as well.  Patient feeling somewhat better. BP improved. BP (!) 114/57 (BP Location: Right Arm, Patient Position: Sitting)   Pulse 63   Temp 97.9 F (36.6 C) (Oral)   Resp 16   Wt 158 lb 4 oz (71.8 kg)   SpO2 99%   BMI 28.03 kg/m

## 2023-01-15 NOTE — Patient Instructions (Signed)
[facility]  Discharge Instructions: Thank you for choosing [facility] to provide your oncology and hematology care.   If you have a lab appointment with the [facility], please go directly to the [facility] and check in at the registration area.   Wear comfortable clothing and clothing appropriate for easy access to any Portacath or PICC line.   We strive to give you quality time with your provider. You may need to reschedule your appointment if you arrive late (15 or more minutes).  Arriving late affects you and other patients whose appointments are after yours.  Also, if you miss three or more appointments without notifying the office, you may be dismissed from the clinic at the provider's discretion.      For prescription refill requests, have your pharmacy contact our office and allow 72 hours for refills to be completed.    Today you received the following chemotherapy and/or immunotherapy agents Velcade      To help prevent nausea and vomiting after your treatment, we encourage you to take your nausea medication as directed.  BELOW ARE SYMPTOMS THAT SHOULD BE REPORTED IMMEDIATELY: *FEVER GREATER THAN 100.4 F (38 C) OR HIGHER *CHILLS OR SWEATING *NAUSEA AND VOMITING THAT IS NOT CONTROLLED WITH YOUR NAUSEA MEDICATION *UNUSUAL SHORTNESS OF BREATH *UNUSUAL BRUISING OR BLEEDING *URINARY PROBLEMS (pain or burning when urinating, or frequent urination) *BOWEL PROBLEMS (unusual diarrhea, constipation, pain near the anus) TENDERNESS IN MOUTH AND THROAT WITH OR WITHOUT PRESENCE OF ULCERS (sore throat, sores in mouth, or a toothache) UNUSUAL RASH, SWELLING OR PAIN  UNUSUAL VAGINAL DISCHARGE OR ITCHING   Items with * indicate a potential emergency and should be followed up as soon as possible or go to the Emergency Department if any problems should occur.  Please show the CHEMOTHERAPY ALERT CARD or IMMUNOTHERAPY ALERT CARD at check-in  to the Emergency Department and triage nurse.  Should you have questions after your visit or need to cancel or reschedule your appointment, please contact [facility]  Dept: [phone]  and follow the prompts.  Office hours are 8:00 a.m. to 4:30 p.m. Monday - Friday. Please note that voicemails left after 4:00 p.m. may not be returned until the following business day.  We are closed weekends and major holidays. You have access to a nurse at all times for urgent questions. Please call the main number to the clinic Dept: [phone] and follow the prompts.   For any non-urgent questions, you may also contact your provider using MyChart. We now offer e-Visits for anyone 26 and older to request care online for non-urgent symptoms. For details visit [email or web address].   Also download the MyChart app! Go to the app store, search "MyChart", open the app, select [facility], and log in with your MyChart username and password.  Bortezomib Injection (Velcade) What is this medication? BORTEZOMIB (bor TEZ oh mib) treats lymphoma. It may also be used to treat multiple myeloma, a type of bone marrow cancer. It works by blocking a protein that causes cancer cells to grow and multiply. This helps to slow or stop the spread of cancer cells. This medicine may be used for other purposes; ask your health care provider or pharmacist if you have questions. COMMON BRAND NAME(S): Velcade What should I tell my care team before I take this medication? They need to know if you have any of these conditions: Dehydration Diabetes Heart disease Liver disease  Tingling of the fingers or toes or other nerve disorder An unusual or allergic reaction to bortezomib, other medications, foods, dyes, or preservatives If you or your partner are pregnant or trying to get pregnant Breastfeeding How should I use this medication? This medication is injected into a vein or under the skin. It  is given by your care team in a hospital or clinic setting. Talk to your care team about the use of this medication in children. Special care may be needed. Overdosage: If you think you have taken too much of this medicine contact a poison control center or emergency room at once. NOTE: This medicine is only for you. Do not share this medicine with others. What if I miss a dose? Keep appointments for follow-up doses. It is important not to miss your dose. Call your care team if you are unable to keep an appointment. What may interact with this medication? Ketoconazole Rifampin This list may not describe all possible interactions. Give your health care provider a list of all the medicines, herbs, non-prescription drugs, or dietary supplements you use. Also tell them if you smoke, drink alcohol, or use illegal drugs. Some items may interact with your medicine. What should I watch for while using this medication? Your condition will be monitored carefully while you are receiving this medication. You may need blood work while taking this medication. This medication may affect your coordination, reaction time, or judgment. Do not drive or operate machinery until you know how this medication affects you. Sit up or stand slowly to reduce the risk of dizzy or fainting spells. Drinking alcohol with this medication can increase the risk of these side effects. This medication may increase your risk of getting an infection. Call your care team for advice if you get a fever, chills, sore throat, or other symptoms of a cold or flu. Do not treat yourself. Try to avoid being around people who are sick. Check with your care team if you have severe diarrhea, nausea, and vomiting, or if you sweat a lot. The loss of too much body fluid may make it dangerous for you to take this medication. Talk to your care team if you may be pregnant. Serious birth defects can occur if you take this medication during pregnancy and for 7  months after the last dose. You will need a negative pregnancy test before starting this medication. Contraception is recommended while taking this medication and for 7 months after the last dose. Your care team can help you find the option that works for you. If your partner can get pregnant, use a condom during sex while taking this medication and for 4 months after the last dose. Do not breastfeed while taking this medication and for 2 months after the last dose. This medication may cause infertility. Talk to your care team if you are concerned about your fertility. What side effects may I notice from receiving this medication? Side effects that you should report to your care team as soon as possible: Allergic reactions--skin rash, itching, hives, swelling of the face, lips, tongue, or throat Bleeding--bloody or black, tar-like stools, vomiting blood or brown material that looks like coffee grounds, red or dark brown urine, small red or purple spots on skin, unusual bruising or bleeding Bleeding in the brain--severe headache, stiff neck, confusion, dizziness, change in vision, numbness or weakness of the face, arm, or leg, trouble speaking, trouble walking, vomiting Bowel blockage--stomach cramping, unable to have a bowel movement or pass gas, loss  of appetite, vomiting Heart failure--shortness of breath, swelling of the ankles, feet, or hands, sudden weight gain, unusual weakness or fatigue Infection--fever, chills, cough, sore throat, wounds that don't heal, pain or trouble when passing urine, general feeling of discomfort or being unwell Liver injury--right upper belly pain, loss of appetite, nausea, light-colored stool, dark yellow or brown urine, yellowing skin or eyes, unusual weakness or fatigue Low blood pressure--dizziness, feeling faint or lightheaded, blurry vision Lung injury--shortness of breath or trouble breathing, cough, spitting up blood, chest pain, fever Pain, tingling, or  numbness in the hands or feet Severe or prolonged diarrhea Stomach pain, bloody diarrhea, pale skin, unusual weakness or fatigue, decrease in the amount of urine, which may be signs of hemolytic uremic syndrome Sudden and severe headache, confusion, change in vision, seizures, which may be signs of posterior reversible encephalopathy syndrome (PRES) TTP--purple spots on the skin or inside the mouth, pale skin, yellowing skin or eyes, unusual weakness or fatigue, fever, fast or irregular heartbeat, confusion, change in vision, trouble speaking, trouble walking Tumor lysis syndrome (TLS)--nausea, vomiting, diarrhea, decrease in the amount of urine, dark urine, unusual weakness or fatigue, confusion, muscle pain or cramps, fast or irregular heartbeat, joint pain Side effects that usually do not require medical attention (report to your care team if they continue or are bothersome): Constipation Diarrhea Fatigue Loss of appetite Nausea This list may not describe all possible side effects. Call your doctor for medical advice about side effects. You may report side effects to FDA at 1-800-FDA-1088. Where should I keep my medication? This medication is given in a hospital or clinic. It will not be stored at home. NOTE: This sheet is a summary. It may not cover all possible information. If you have questions about this medicine, talk to your doctor, pharmacist, or health care provider.  2023 Elsevier/Gold Standard (2022-03-08 00:00:00) Rehydration, Older Adult  Rehydration is the replacement of fluids, salts, and minerals in the body (electrolytes) that are lost during dehydration. Dehydration is when there is not enough water or other fluids in the body. This happens when you lose more fluids than you take in. People who are age 61 or older have a higher risk of dehydration than younger adults. This is because in older age, the body: Is less able to maintain the right amount of water. Does not  respond to temperature changes as well. Does not get a sense of thirst as easily or quickly. Other causes include: Not drinking enough fluids. This can occur when you are ill, when you forget to drink, or when you are doing activities that require a lot of energy, especially in hot weather. Conditions that cause loss of water or other fluids. These include diarrhea, vomiting, sweating, or urinating a lot. Other illnesses, such as fever or infection. Certain medicines, such as those that remove excess fluid from the body (diuretics). Symptoms of mild or moderate dehydration may include thirst, dry lips and mouth, and dizziness. Symptoms of severe dehydration may include increased heart rate, confusion, fainting, and not urinating. In severe cases, you may need to get fluids through an IV at the hospital. For mild or moderate cases, you can usually rehydrate at home by drinking certain fluids as told by your health care provider. What are the risks? Rehydration is usually safe. Taking in too much fluid (overhydration) can be a problem but is rare. Overhydration can cause an imbalance of electrolytes in the body, kidney failure, fluid in the lungs, or a decrease in  salt (sodium) levels in the body. Supplies needed: You will need an oral rehydration solution (ORS) if your health care provider tells you to use one. This is a drink to treat dehydration. It can be found in pharmacies and retail stores. How to rehydrate Fluids Follow instructions from your health care provider about what to drink. The kind of fluid and the amount you should drink depend on your condition. In general, you should choose drinks that you prefer. If told by your health care provider, drink an ORS. Make an ORS by following instructions on the package. Start by drinking small amounts, about  cup (120 mL) every 5-10 minutes. Slowly increase how much you drink until you have taken in the amount recommended by your health care  provider. Drink enough clear fluids to keep your urine pale yellow. If you were told to drink an ORS, finish it first, then start slowly drinking other clear fluids. Drink fluids such as: Water. This includes sparkling and flavored water. Drinking only water can lead to having too little sodium in your body (hyponatremia). Follow the advice of your health care provider. Water from ice chips you suck on. Fruit juice with water added to it(diluted). Sports drinks. Hot or cold herbal teas. Broth-based soups. Coffee. Milk or milk products. Food Follow instructions from your health care provider about what to eat while you rehydrate. Your health care provider may recommend that you slowly begin eating regular foods in small amounts. Eat foods that contain a healthy balance of electrolytes, such as bananas, oranges, potatoes, tomatoes, and spinach. Avoid foods that are greasy or contain a lot of sugar. In some cases, you may get nutrition through a feeding tube that is passed through your nose and into your stomach (nasogastric tube, or NG tube). This may be done if you have uncontrolled vomiting or diarrhea. Drinks to avoid  Certain drinks may make dehydration worse. While you rehydrate, avoid drinking alcohol. How to tell if you are recovering from dehydration You may be getting better if: You are urinating more often than before you started rehydrating. Your urine is pale yellow. Your energy level improves. You vomit less often. You have diarrhea less often. Your appetite improves or returns to normal. You feel less dizzy or light-headed. Your skin tone and color start to look more normal. Follow these instructions at home: Take over-the-counter and prescription medicines only as told by your health care provider. Do not take sodium tablets. Doing this can lead to having too much sodium in your body (hypernatremia). Contact a health care provider if: You continue to have symptoms of mild  or moderate dehydration, such as: Thirst. Dry lips. Slightly dry mouth. Dizziness. Dark urine or less urine than usual. Muscle cramps. You continue to vomit or have diarrhea. Get help right away if: You have symptoms of dehydration that get worse. You have a fever. You have a severe headache. You have been vomiting and have problems, such as: Your vomiting gets worse. Your vomit includes blood or green matter (bile). You cannot eat or drink without vomiting. You have problems with urination or bowel movements, such as: Diarrhea that gets worse. Blood in your stool (feces). This may cause stool to look black and tarry. Not urinating, or urinating only a small amount of very dark urine, within 6-8 hours. You have trouble breathing. You have symptoms that get worse with treatment. These symptoms may be an emergency. Get help right away. Call 911. Do not wait to see if  the symptoms will go away. Do not drive yourself to the hospital. This information is not intended to replace advice given to you by your health care provider. Make sure you discuss any questions you have with your health care provider. Document Revised: 02/22/2022 Document Reviewed: 02/20/2022 Elsevier Patient Education  [place].

## 2023-01-16 ENCOUNTER — Other Ambulatory Visit: Payer: Self-pay

## 2023-01-16 ENCOUNTER — Telehealth: Payer: Self-pay | Admitting: *Deleted

## 2023-01-16 NOTE — Telephone Encounter (Signed)
Zoe [name] will come in tomorrow at 0900 to [facility]

## 2023-01-16 NOTE — Progress Notes (Unsigned)
Symptom Management Consult Note [place]    Patient Care Team: [facility] [place] as PCP - General (Rheumatology)    Name / MRN / DOB: Zoe [name]  [id number]  11/23/1933   Date of visit: 01/17/2023   Chief Complaint/Reason for visit: rash, diarrhea   Current Therapy: Velcade  Last treatment:  Day 15   Cycle 8 on 01/15/23.   ASSESSMENT & PLAN: Patient is a 89 y.o. female  with oncologic history of multiple myeloma not having achieved remission followed by Dr. Lorenso Courier.  I have viewed most recent oncology note and lab work.    #Multiple myeloma not having achieved remission - Next appointment with oncologist is 01/22/23  #Diarrhea - Controlled with Imodium.  -Patient nontoxic-appearing.  She is afebrile.  BP is soft on clinic arrival.  Recheck without much improvement. Benign abdominal exam - Suspect dehydration with diarrhea and decreased fluid intake recently.  Patient given 500 mL NS in clinic. BP improved after hydration. -CBC overall unremarkable. CMP shows stable hyponatremia 131 otherwise no significant electrolyte derangement, creatinine nl, no transaminitis.   #Rash - Consistent with AE of velcade. - Will prescribe medrol dosepak. Oncologist made aware and agrees with plan.  Strict ED precautions discussed should symptoms worsen.   Heme/Onc History: Oncology History  Multiple myeloma not having achieved remission ([place])  07/24/2022 Initial Diagnosis   Multiple myeloma not having achieved remission ([place])   08/01/2022 -  Chemotherapy   Patient is on Treatment Plan : MYELOMA NON-TRANSPLANT CANDIDATES VRd weekly q21d         Interval history-: [name] is a 89 y.o. female with oncologic history as above presenting to [facility] today with chief complaint of rash and diarrhea.  Accompanied by a friend to provide additional history.  Patient states she has had diarrhea x 1 day.  She had 2 episodes of brown liquid stool  yesterday.  She took Imodium yesterday afternoon and has not had any additional diarrhea.  She admits to strong appetite, maybe did not drink enough fluids yesterday she says.  She has no nausea or vomiting.  She did have abdominal cramping preceding bowel movement that promptly resolved after using the bathroom.  She denies any recent antibiotic use or travel. Patient states the rash has been present x 3 days.  She first noticed a spot on her abdomen while she was here in infusion for treatment on Monday.  She noticed that night that the rash spread to her back and arms.  She states the rash does not itch.  She took a Benadryl last night, unsure if that helped at all.  She declines any new lotions or soaps.  She denies history of similar rash.  Denies any fever or chills.      ROS  All other systems are reviewed and are negative for acute change except as noted in the HPI.    Allergies  Allergen Reactions   Penicillins Other (See Comments)    UNSPECIFIED REACTION  Patient does not remember reaction.  Has patient had a PCN reaction causing immediate rash, facial/tongue/throat swelling, SOB or lightheadedness with hypotension: no Has patient had a PCN reaction causing severe rash involving mucus membranes or skin necrosis: no Has patient had a PCN reaction that required hospitalization no Has patient had a PCN reaction occurring within the last 10 years: no If all of the above answers are "NO", then may proceed with Cephalosporin use.    Sulfa Antibiotics Other (  See Comments)    UNSPECIFIED REACTION  "maybe vision issues? "     Past Medical History:  Diagnosis Date   Arthritis    some - per patient   Breast cancer ([place])    breast cancer / left    Cataract    bilat    GERD (gastroesophageal reflux disease)    History of kidney stones    Hyperlipidemia    Hypertension    Hypothyroidism    Macular degeneration    Left   S/P TAVR (transcatheter aortic valve replacement)  09/03/2018   23 mm Edwards Sapien 3 transcatheter heart valve placed via percutaneous right transfemoral approach    Severe aortic stenosis    Stress incontinence    Thyroid disease    Tinnitus      Past Surgical History:  Procedure Laterality Date   ABDOMINAL HYSTERECTOMY  1970's   BACK SURGERY     BREAST LUMPECTOMY  12/1998   lumpectomy   CARDIAC CATHETERIZATION     EYE SURGERY     cataract surgery bilat    INTRAOPERATIVE TRANSTHORACIC ECHOCARDIOGRAM N/A 09/03/2018   Procedure: INTRAOPERATIVE TRANSTHORACIC ECHOCARDIOGRAM;  Surgeon: Burnell Blanks, MD;  Location: [facility];  Service: Open Heart Surgery;  Laterality: N/A;   KYPHOPLASTY N/A 09/07/2022   Procedure: THORACIC EIGHT KYPHOPLASTY;  Surgeon: Melina Schools, MD;  Location: [facility];  Service: Orthopedics;  Laterality: N/A;  1 hr Local with [facility] 3 C-Bed   LITHOTRIPSY     Right total knee     2018 Dr. Alvan Dame   RIGHT/LEFT HEART CATH AND CORONARY ANGIOGRAPHY N/A 08/06/2018   Procedure: RIGHT/LEFT HEART CATH AND CORONARY ANGIOGRAPHY;  Surgeon: Adrian Prows, MD;  Location: [facility];  Service: Cardiovascular;  Laterality: N/A;   THYROIDECTOMY, PARTIAL  1975   TONSILLECTOMY     as a child - patient not sure of exact date   TOTAL KNEE ARTHROPLASTY Left 03/13/2016   Procedure: TOTAL KNEE ARTHROPLASTY;  Surgeon: Paralee Cancel, MD;  Location: [facility];  Service: Orthopedics;  Laterality: Left;   TOTAL KNEE ARTHROPLASTY Right 06/18/2017   Procedure: RIGHT TOTAL KNEE ARTHROPLASTY;  Surgeon: Paralee Cancel, MD;  Location: [facility];  Service: Orthopedics;  Laterality: Right;   TRANSCATHETER AORTIC VALVE REPLACEMENT, TRANSFEMORAL N/A 09/03/2018   Procedure: TRANSCATHETER AORTIC VALVE REPLACEMENT, TRANSFEMORAL;  Surgeon: Burnell Blanks, MD;  Location: [facility];  Service: Open Heart Surgery;  Laterality: N/A;    Social History   Socioeconomic History   Marital status: Widowed    Spouse name: Not on file   Number of  children: 4   Years of education: Not on file   Highest education level: Master's degree (e.g., MA, MS, MEng, MEd, MSW, MBA)  Occupational History   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  Tobacco Use   Smoking status: Never   Smokeless tobacco: Never  Vaping Use   Vaping Use: Never used  Substance and Sexual Activity   Alcohol use: No   Drug use: No   Sexual activity: Not Currently  Other Topics Concern   Not on file  Social History Narrative   Not on file   Social Determinants of Health   Financial Resource Strain: Low Risk  (11/12/2018)   Overall Financial Resource Strain (CARDIA)    Difficulty of Paying Living Expenses: Not very hard  Food Insecurity: No Food Insecurity (11/12/2018)   Hunger Vital Sign    Worried About Running Out of Food in the Last  Year: Never true    [place] in the Last Year: Never true  Transportation Needs: No Transportation Needs (11/12/2018)   PRAPARE - Hydrologist (Medical): No    Lack of Transportation (Non-Medical): No  Physical Activity: Inactive (11/12/2018)   Exercise Vital Sign    Days of Exercise per Week: 0 days    Minutes of Exercise per Session: 0 min  Stress: Stress Concern Present (11/12/2018)   [place]    Feeling of Stress : To some extent  Social Connections: Not on file  Intimate Partner Violence: Not on file    Family History  Problem Relation Age of Onset   Diabetes Mother    Stroke Mother        Carotid artery disease   Heart disease Father        CAD   Coronary artery disease Father    Diabetes Sister      Current Outpatient Medications:    methylPREDNISolone (MEDROL DOSEPAK) 4 MG TBPK tablet, Take 6 pills by mouth day 1, 5 on day 2, 4 on day 3, 3 on day 4, 2 on day 5, 1 on day 6, Disp: 21 tablet, Rfl: 0   acyclovir (ZOVIRAX) 400 MG tablet, Take 400 mg by mouth 2 (two) times daily. Pt  takes 1 tablet (400 mg total) by mouth 2 (two) times daily., Disp: , Rfl:    Artificial Tear Solution (SOOTHE XP) SOLN, Place 1 drop into both eyes every evening., Disp: , Rfl:    aspirin EC 81 MG tablet, Take 81 mg by mouth at bedtime. Swallow whole., Disp: , Rfl:    calcitonin, salmon, (MIACALCIN/FORTICAL) 200 UNIT/ACT nasal spray, Place 1 spray into alternate nostrils daily., Disp: , Rfl:    carvedilol (COREG) 12.5 MG tablet, Take 12.5 mg by mouth 2 (two) times daily with a meal., Disp: , Rfl:    Cholecalciferol (VITAMIN D3) 50 MCG (2000 UT) capsule, Take 1 capsule (2,000 Units total) by mouth daily., Disp: , Rfl:    cyanocobalamin (VITAMIN B12) 1000 MCG tablet, Take 1,000 mcg by mouth daily., Disp: , Rfl:    dapagliflozin propanediol (FARXIGA) 10 MG TABS tablet, TAKE 1 TABLET BY MOUTH EVERY DAY, Disp: 90 tablet, Rfl: 2   dexamethasone (DECADRON) 4 MG tablet, Take 5 tablets (20 mg total) by mouth once a week. Take 20 mg (5 tablets) on day of multiple myeloma treatment, Disp: 20 tablet, Rfl: 5   esomeprazole (NEXIUM) 20 MG capsule, Take 20 mg by mouth daily as needed (Heartburn)., Disp: , Rfl:    ferrous sulfate 325 (65 FE) MG tablet, Take 325 mg by mouth daily with breakfast. (Patient not taking: Reported on 11/28/2022), Disp: , Rfl:    furosemide (LASIX) 40 MG tablet, Take 1 tablet (40 mg total) by mouth daily., Disp: 90 tablet, Rfl: 1   gabapentin (NEURONTIN) 300 MG capsule, Take 3 capsules (900 mg total) by mouth 2 (two) times daily., Disp: 180 capsule, Rfl: 1   lenalidomide (REVLIMID) 25 MG capsule, Take 1 capsule (25 mg total) by mouth daily. Take for 14 days, then none for 7 days. Repeat every 21 days. [employer] Auth # [id number] Date Obtained 12/20/2022, Disp: 14 capsule, Rfl: 0   levothyroxine (SYNTHROID, LEVOTHROID) 100 MCG tablet, Take 100 mcg by mouth daily before breakfast., Disp: , Rfl: 2   losartan (COZAAR) 25 MG tablet, TAKE 1 TABLET (25 MG TOTAL) BY MOUTH DAILY.,  Disp: 90 tablet, Rfl: 1    ondansetron (ZOFRAN) 8 MG tablet, Take 1 tablet (8 mg total) by mouth every 8 (eight) hours as needed. (Patient not taking: Reported on 11/28/2022), Disp: 30 tablet, Rfl: 0   polyethylene glycol (MIRALAX / GLYCOLAX) 17 g packet, Take 17 g by mouth daily as needed for mild constipation., Disp: , Rfl:    potassium chloride (KLOR-CON M10) 10 MEQ tablet, Take 2 tablets (20 mEq total) by mouth 2 (two) times daily., Disp: 180 tablet, Rfl: 1   pravastatin (PRAVACHOL) 40 MG tablet, Take 40 mg by mouth every evening., Disp: , Rfl:    prochlorperazine (COMPAZINE) 10 MG tablet, Take 1 tablet (10 mg total) by mouth every 6 (six) hours as needed for nausea or vomiting., Disp: 30 tablet, Rfl: 0  PHYSICAL EXAM: ECOG FS:1 - Symptomatic but completely ambulatory    Vitals:   01/17/23 0905 01/17/23 1004 01/17/23 1136  BP: (!) 91/49 (!) 98/53 (!) 108/51  Pulse: 69  65  Resp: 18  16  Temp: (!) 97.3 F (36.3 C)    SpO2: 100%  100%  Weight: 158 lb 1.6 oz (71.7 kg)     Physical Exam Vitals and nursing note reviewed.  Constitutional:      Appearance: She is well-developed. She is not ill-appearing or toxic-appearing.  HENT:     Head: Normocephalic.     Nose: Nose normal.  Eyes:     Conjunctiva/sclera: Conjunctivae normal.  Neck:     Vascular: No JVD.  Cardiovascular:     Rate and Rhythm: Normal rate and regular rhythm.     Pulses: Normal pulses.     Heart sounds: Normal heart sounds.  Pulmonary:     Effort: Pulmonary effort is normal.     Breath sounds: Normal breath sounds.  Abdominal:     General: There is no distension.  Musculoskeletal:     Cervical back: Normal range of motion.  Skin:    General: Skin is warm and dry.     Findings: Rash present.     Comments: Macular rash on torso and BUE. Please see media below  Neurological:     Mental Status: She is oriented to person, place, and time.         LABORATORY DATA: I have reviewed the data as listed    Latest Ref Rng & Units  01/17/2023   10:15 AM 01/15/2023    8:25 AM 01/09/2023    8:59 AM  CBC  WBC 4.0 - 10.5 K/uL 7.6  6.2  6.8   Hemoglobin 12.0 - 15.0 g/dL 13.8  13.9  13.5   Hematocrit 36.0 - 46.0 % 40.4  40.9  40.1   Platelets 150 - 400 K/uL 125  166  223         Latest Ref Rng & Units 01/17/2023   10:15 AM 01/15/2023    8:25 AM 01/09/2023    8:59 AM  CMP  Glucose 70 - 99 mg/dL 87  88  128   BUN 8 - 23 mg/dL 46  42  31   Creatinine 0.44 - 1.00 mg/dL 1.00  0.92  0.84   Sodium 135 - 145 mmol/L 131  131  131   Potassium 3.5 - 5.1 mmol/L 4.7  4.9  4.4   Chloride 98 - 111 mmol/L 98  97  96   CO2 22 - 32 mmol/L 28  26  28    Calcium 8.9 - 10.3 mg/dL 8.8  8.7  8.8   Total Protein 6.5 - 8.1 g/dL 6.2  5.6  6.2   Total Bilirubin 0.3 - 1.2 mg/dL 0.5  0.8  0.8   Alkaline Phos 38 - 126 U/L 53  55  66   AST 15 - 41 U/L 16  14  14    ALT 0 - 44 U/L 12  11  9         RADIOGRAPHIC STUDIES (from last 24 hours if applicable) I have personally reviewed the radiological images as listed and agreed with the findings in the report. No results found.      Visit Diagnosis: 1. Multiple myeloma not having achieved remission ([place])   2. Diarrhea, unspecified type   3. Rash      No orders of the defined types were placed in this encounter.   All questions were answered. The patient knows to call the clinic with any problems, questions or concerns. No barriers to learning was detected.  I have spent a total of 30 minutes minutes of face-to-face and non-face-to-face time, preparing to see the patient, obtaining and/or reviewing separately obtained history, performing a medically appropriate examination, counseling and educating the patient, ordering tests, documenting clinical information in the electronic health record, and care coordination (communications with other health care professionals or caregivers).    Thank you for allowing me to participate in the care of this patient.    Barrie Folk,  PA-C Department of Hematology/Oncology [facility] at [facility] Phone: [phone]  [phone]    01/17/2023 12:08 PM

## 2023-01-16 NOTE — Telephone Encounter (Signed)
Pt states she has a rash on stomach,arms, legs and back. States she is not sure if rash started after Velcade. RN who gave the injection says she noted very faint rash on abdomen prior to injection, pt states it had been there. Rash does not itch. Also states she has also been having diarrhea. Instructed to get benadryl and imodium.  Dr Lorenso Courier would like for her to be evaluated in our [facility].

## 2023-01-17 ENCOUNTER — Inpatient Hospital Stay: Payer: Medicare Other

## 2023-01-17 ENCOUNTER — Inpatient Hospital Stay (HOSPITAL_BASED_OUTPATIENT_CLINIC_OR_DEPARTMENT_OTHER): Payer: Medicare Other | Admitting: Physician Assistant

## 2023-01-17 ENCOUNTER — Other Ambulatory Visit: Payer: Self-pay

## 2023-01-17 VITALS — BP 108/51 | HR 65 | Temp 97.3°F | Resp 16 | Wt 158.1 lb

## 2023-01-17 DIAGNOSIS — E871 Hypo-osmolality and hyponatremia: Secondary | ICD-10-CM | POA: Diagnosis not present

## 2023-01-17 DIAGNOSIS — C9000 Multiple myeloma not having achieved remission: Secondary | ICD-10-CM | POA: Diagnosis not present

## 2023-01-17 DIAGNOSIS — R197 Diarrhea, unspecified: Secondary | ICD-10-CM | POA: Diagnosis not present

## 2023-01-17 DIAGNOSIS — R600 Localized edema: Secondary | ICD-10-CM | POA: Diagnosis not present

## 2023-01-17 DIAGNOSIS — D649 Anemia, unspecified: Secondary | ICD-10-CM | POA: Diagnosis not present

## 2023-01-17 DIAGNOSIS — Z5112 Encounter for antineoplastic immunotherapy: Secondary | ICD-10-CM | POA: Diagnosis not present

## 2023-01-17 DIAGNOSIS — R21 Rash and other nonspecific skin eruption: Secondary | ICD-10-CM | POA: Diagnosis not present

## 2023-01-17 DIAGNOSIS — G620 Drug-induced polyneuropathy: Secondary | ICD-10-CM | POA: Diagnosis not present

## 2023-01-17 DIAGNOSIS — Z452 Encounter for adjustment and management of vascular access device: Secondary | ICD-10-CM | POA: Diagnosis not present

## 2023-01-17 DIAGNOSIS — E538 Deficiency of other specified B group vitamins: Secondary | ICD-10-CM | POA: Diagnosis not present

## 2023-01-17 LAB — CBC WITH DIFFERENTIAL (CANCER CENTER ONLY)
Abs Immature Granulocytes: 0.07 10*3/uL (ref 0.00–0.07)
Basophils Absolute: 0.1 10*3/uL (ref 0.0–0.1)
Basophils Relative: 1 %
Eosinophils Absolute: 0.4 10*3/uL (ref 0.0–0.5)
Eosinophils Relative: 5 %
HCT: 40.4 % (ref 36.0–46.0)
Hemoglobin: 13.8 g/dL (ref 12.0–15.0)
Immature Granulocytes: 1 %
Lymphocytes Relative: 9 %
Lymphs Abs: 0.7 10*3/uL (ref 0.7–4.0)
MCH: 29.6 pg (ref 26.0–34.0)
MCHC: 34.2 g/dL (ref 30.0–36.0)
MCV: 86.5 fL (ref 80.0–100.0)
Monocytes Absolute: 1.3 10*3/uL — ABNORMAL HIGH (ref 0.1–1.0)
Monocytes Relative: 17 %
Neutro Abs: 5.1 10*3/uL (ref 1.7–7.7)
Neutrophils Relative %: 67 %
Platelet Count: 125 10*3/uL — ABNORMAL LOW (ref 150–400)
RBC: 4.67 MIL/uL (ref 3.87–5.11)
RDW: 16.6 % — ABNORMAL HIGH (ref 11.5–15.5)
WBC Count: 7.6 10*3/uL (ref 4.0–10.5)
nRBC: 0 % (ref 0.0–0.2)

## 2023-01-17 LAB — CMP (CANCER CENTER ONLY)
ALT: 12 U/L (ref 0–44)
AST: 16 U/L (ref 15–41)
Albumin: 3.5 g/dL (ref 3.5–5.0)
Alkaline Phosphatase: 53 U/L (ref 38–126)
Anion gap: 5 (ref 5–15)
BUN: 46 mg/dL — ABNORMAL HIGH (ref 8–23)
CO2: 28 mmol/L (ref 22–32)
Calcium: 8.8 mg/dL — ABNORMAL LOW (ref 8.9–10.3)
Chloride: 98 mmol/L (ref 98–111)
Creatinine: 1 mg/dL (ref 0.44–1.00)
GFR, Estimated: 55 mL/min — ABNORMAL LOW (ref >60)
Glucose, Bld: 87 mg/dL (ref 70–99)
Potassium: 4.7 mmol/L (ref 3.5–5.1)
Sodium: 131 mmol/L — ABNORMAL LOW (ref 135–145)
Total Bilirubin: 0.5 mg/dL (ref 0.3–1.2)
Total Protein: 6.2 g/dL — ABNORMAL LOW (ref 6.5–8.1)

## 2023-01-17 LAB — MAGNESIUM: Magnesium: 2 mg/dL (ref 1.7–2.4)

## 2023-01-17 MED ORDER — METHYLPREDNISOLONE 4 MG PO TBPK: ORAL_TABLET | ORAL | 0 refills | Status: DC

## 2023-01-17 MED ORDER — SODIUM CHLORIDE 0.9 % IV SOLN: Freq: Once | INTRAVENOUS | Status: AC

## 2023-01-21 NOTE — Progress Notes (Unsigned)
[place] [phone]   [phone]  PROGRESS NOTE  Patient Care Team: [facility] [place] as PCP - General (Rheumatology)  Hematological/Oncological History # IgG Lambda Multiple Myeloma  06/14/2022: establish care with [name] due to anemia. Labs showed M protein 4.9, Kappa 7.2, Lambda 10.8, ratio 0.67 07/13/2022: Bmbx showed Lambda restricted plasma cell neoplasm involving approximately 60% of the cellular marrow by IHC on the biopsy.  08/01/2022: Cycle 1 Day 1 of VRd chemotherapy. (Holding revlimid initially) 08/21/2022:  Cycle 2 Day 1 of VRd chemotherapy. (Holding revlimid) 09/04/2022: Cycle 3 Day 1 of VRd chemotherapy. Started revlimid 10/03/2022: Cycle 4 Day 1 of VRd chemotherapy 10/31/2022: Cycle 5 Day 1 of VRd chemotherapy 11/20/2022: Cycle 6 Day 1 of VRd chemotherapy 12/11/2022: Cycle 7 Day 1 of VRd chemotherapy 01/02/2023: Cycle 8 Day 1 of VRd chemotherapy  Interval History:  Zoe [name] 89 y.o. female with medical history significant for newly diagnosed multiple myeloma who presents for a follow up visit. The patient's last visit was on 01/09/2023. In the interim since the last visit she has continued VRd chemotherapy.   On exam today Zoe [name] reports she is concerned about her blood pressure.  She has been taking at home and has had seen systolics in the [id number] and [id number].  She reports that normally her blood pressures are lowest in the morning but she rebounds by the afternoon.  She reports that sometimes she feels like she cannot get up because of how weak and lightheaded she feels.  She reports that she does continue to have some neuropathy where her hands lock up a lot.  She notes that she has not been doing much in the way of exercising or moving around due to the blood pressure issues.  She is also been having blurriness of vision but no chest pain, shortness of breath, or headache.  She feels like she is urinating well  but does endorse not having any issues with erythema.  She notes that she has a rash over her chest and belly but it is not painful or itchy. She denies fevers, chills, sweats, shortness of breath, chest pain or cough. She has no other complaints.  Full 10 point ROS was otherwise negative.  MEDICAL HISTORY:  Past Medical History:  Diagnosis Date   Arthritis    some - per patient   Breast cancer ([place])    breast cancer / left    Cataract    bilat    GERD (gastroesophageal reflux disease)    History of kidney stones    Hyperlipidemia    Hypertension    Hypothyroidism    Macular degeneration    Left   S/P TAVR (transcatheter aortic valve replacement) 09/03/2018   23 mm Edwards Sapien 3 transcatheter heart valve placed via percutaneous right transfemoral approach    Severe aortic stenosis    Stress incontinence    Thyroid disease    Tinnitus     SURGICAL HISTORY: Past Surgical History:  Procedure Laterality Date   ABDOMINAL HYSTERECTOMY  1970's   BACK SURGERY     BREAST LUMPECTOMY  12/1998   lumpectomy   CARDIAC CATHETERIZATION     EYE SURGERY     cataract surgery bilat    INTRAOPERATIVE TRANSTHORACIC ECHOCARDIOGRAM N/A 09/03/2018   Procedure: INTRAOPERATIVE TRANSTHORACIC ECHOCARDIOGRAM;  Surgeon: Burnell Blanks, MD;  Location: [facility];  Service: Open Heart Surgery;  Laterality: N/A;   KYPHOPLASTY N/A 09/07/2022   Procedure: THORACIC EIGHT KYPHOPLASTY;  Surgeon: Melina Schools, MD;  Location: [facility];  Service: Orthopedics;  Laterality: N/A;  1 hr Local with [facility] 3 C-Bed   LITHOTRIPSY     Right total knee     2018 Dr. Alvan Dame   RIGHT/LEFT HEART CATH AND CORONARY ANGIOGRAPHY N/A 08/06/2018   Procedure: RIGHT/LEFT HEART CATH AND CORONARY ANGIOGRAPHY;  Surgeon: Adrian Prows, MD;  Location: [facility];  Service: Cardiovascular;  Laterality: N/A;   THYROIDECTOMY, PARTIAL  1975   TONSILLECTOMY     as a child - patient not sure of exact date   TOTAL KNEE  ARTHROPLASTY Left 03/13/2016   Procedure: TOTAL KNEE ARTHROPLASTY;  Surgeon: Paralee Cancel, MD;  Location: [facility];  Service: Orthopedics;  Laterality: Left;   TOTAL KNEE ARTHROPLASTY Right 06/18/2017   Procedure: RIGHT TOTAL KNEE ARTHROPLASTY;  Surgeon: Paralee Cancel, MD;  Location: [facility];  Service: Orthopedics;  Laterality: Right;   TRANSCATHETER AORTIC VALVE REPLACEMENT, TRANSFEMORAL N/A 09/03/2018   Procedure: TRANSCATHETER AORTIC VALVE REPLACEMENT, TRANSFEMORAL;  Surgeon: Burnell Blanks, MD;  Location: 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  Tobacco Use   Smoking status: Never   Smokeless tobacco: Never  Vaping Use   Vaping Use: Never used  Substance and Sexual Activity   Alcohol use: No   Drug use: No   Sexual activity: Not Currently  Other Topics Concern   Not on file  Social History Narrative   Not on file   Social Determinants of Health   Financial Resource Strain: Low Risk  (11/12/2018)   Overall Financial Resource Strain (CARDIA)    Difficulty of Paying Living Expenses: Not very hard  Food Insecurity: No Food Insecurity (11/12/2018)   Hunger Vital Sign    Worried About Running Out of Food in the Last Year: Never true    Ran Out of Food in the Last Year: Never true  Transportation Needs: No Transportation Needs (11/12/2018)   PRAPARE - Hydrologist (Medical): No    Lack of Transportation (Non-Medical): No  Physical Activity: Inactive (11/12/2018)   Exercise Vital Sign    Days of Exercise per Week: 0 days    Minutes of Exercise per Session: 0 min  Stress: Stress Concern Present  (11/12/2018)   [place]    Feeling of Stress : To some extent  Social Connections: Not on file  Intimate Partner Violence: Not on file    FAMILY HISTORY: Family History  Problem Relation Age of Onset   Diabetes Mother    Stroke Mother        Carotid artery disease   Heart disease Father        CAD   Coronary artery disease Father    Diabetes Sister     ALLERGIES:  is allergic to penicillins and sulfa antibiotics.  MEDICATIONS:  Current Outpatient Medications  Medication Sig Dispense Refill   acyclovir (ZOVIRAX) 400 MG tablet Take 400 mg by mouth 2 (two) times daily. Pt takes 1 tablet (400 mg total) by mouth 2 (two) times daily.  Artificial Tear Solution (SOOTHE XP) SOLN Place 1 drop into both eyes every evening.     aspirin EC 81 MG tablet Take 81 mg by mouth at bedtime. Swallow whole.     calcitonin, salmon, (MIACALCIN/FORTICAL) 200 UNIT/ACT nasal spray Place 1 spray into alternate nostrils daily.     carvedilol (COREG) 12.5 MG tablet Take 12.5 mg by mouth 2 (two) times daily with a meal.     Cholecalciferol (VITAMIN D3) 50 MCG (2000 UT) capsule Take 1 capsule (2,000 Units total) by mouth daily.     cyanocobalamin (VITAMIN B12) 1000 MCG tablet Take 1,000 mcg by mouth daily.     dapagliflozin propanediol (FARXIGA) 10 MG TABS tablet TAKE 1 TABLET BY MOUTH EVERY DAY 90 tablet 2   dexamethasone (DECADRON) 4 MG tablet Take 5 tablets (20 mg total) by mouth once a week. Take 20 mg (5 tablets) on day of multiple myeloma treatment 20 tablet 5   esomeprazole (NEXIUM) 20 MG capsule Take 20 mg by mouth daily as needed (Heartburn).     furosemide (LASIX) 40 MG tablet Take 1 tablet (40 mg total) by mouth daily. 90 tablet 1   gabapentin (NEURONTIN) 300 MG capsule Take 3 capsules (900 mg total) by mouth 2 (two) times daily. 180 capsule 1   lenalidomide (REVLIMID) 25 MG capsule Take 1 capsule (25 mg total) by mouth daily. Take  for 14 days, then none for 7 days. Repeat every 21 days. [employer] Auth # [id number] Date Obtained 12/20/2022 14 capsule 0   levothyroxine (SYNTHROID, LEVOTHROID) 100 MCG tablet Take 100 mcg by mouth daily before breakfast.  2   losartan (COZAAR) 25 MG tablet TAKE 1 TABLET (25 MG TOTAL) BY MOUTH DAILY. 90 tablet 1   methylPREDNISolone (MEDROL DOSEPAK) 4 MG TBPK tablet Take 6 pills by mouth day 1, 5 on day 2, 4 on day 3, 3 on day 4, 2 on day 5, 1 on day 6 21 tablet 0   ondansetron (ZOFRAN) 8 MG tablet Take 1 tablet (8 mg total) by mouth every 8 (eight) hours as needed. 30 tablet 0   polyethylene glycol (MIRALAX / GLYCOLAX) 17 g packet Take 17 g by mouth daily as needed for mild constipation.     potassium chloride (KLOR-CON M10) 10 MEQ tablet Take 2 tablets (20 mEq total) by mouth 2 (two) times daily. 180 tablet 1   pravastatin (PRAVACHOL) 40 MG tablet Take 40 mg by mouth every evening.     prochlorperazine (COMPAZINE) 10 MG tablet Take 1 tablet (10 mg total) by mouth every 6 (six) hours as needed for nausea or vomiting. 30 tablet 0   triamcinolone ointment (KENALOG) 0.5 % Apply 1 Application topically 2 (two) times daily. 30 g 0   ferrous sulfate 325 (65 FE) MG tablet Take 325 mg by mouth daily with breakfast. (Patient not taking: Reported on 01/22/2023)     No current facility-administered medications for this visit.   Facility-Administered Medications Ordered in Other Visits  Medication Dose Route Frequency Provider Last Rate Last Admin   0.9 %  sodium chloride infusion   Intravenous Continuous Orson Slick, MD   Stopped at 01/22/23 1302    REVIEW OF SYSTEMS:   Constitutional: ( - ) fevers, ( - )  chills , ( - ) night sweats Eyes: ( - ) blurriness of vision, ( - ) double vision, ( - ) watery eyes Ears, nose, mouth, throat, and face: ( - ) mucositis, ( - ) sore throat  Respiratory: ( - ) cough, ( - ) dyspnea, ( - ) wheezes Cardiovascular: ( - ) palpitation, ( - ) chest discomfort, ( +) lower  extremity swelling Gastrointestinal:  ( - ) nausea, ( - ) heartburn, ( - ) change in bowel habits Skin: ( - ) abnormal skin rashes Lymphatics: ( - ) new lymphadenopathy, ( - ) easy bruising Neurological: ( - ) numbness, ( - ) tingling, ( - ) new weaknesses Behavioral/Psych: ( - ) mood change, ( - ) new changes  All other systems were reviewed with the patient and are negative.  PHYSICAL EXAMINATION: ECOG PERFORMANCE STATUS: 1 - Symptomatic but completely ambulatory  Vitals:   01/22/23 1017  BP: (!) 82/50  Pulse: 68  Resp: 18  Temp: 97.7 F (36.5 C)  SpO2: 100%     Filed Weights   01/22/23 1017  Weight: 155 lb 3 oz (70.4 kg)     GENERAL: well appearing elderly Caucasian female, alert, no distress and comfortable SKIN: skin color, texture, turgor are normal, no rashes or significant lesions EYES: conjunctiva are pink and non-injected, sclera clear LUNGS: clear to auscultation and percussion with normal breathing effort HEART: regular rate & rhythm and no murmurs. +1 bilateral lower extremity edema starting below the knee to ankles.  Musculoskeletal: no cyanosis of digits and no clubbing  PSYCH: alert & oriented x 3, fluent speech NEURO: no focal motor/sensory deficits  LABORATORY DATA:  I have reviewed the data as listed    Latest Ref Rng & Units 01/22/2023    9:32 AM 01/17/2023   10:15 AM 01/15/2023    8:25 AM  CBC  WBC 4.0 - 10.5 K/uL 8.4  7.6  6.2   Hemoglobin 12.0 - 15.0 g/dL 13.8  13.8  13.9   Hematocrit 36.0 - 46.0 % 41.7  40.4  40.9   Platelets 150 - 400 K/uL 97  125  166        Latest Ref Rng & Units 01/22/2023    9:32 AM 01/17/2023   10:15 AM 01/15/2023    8:25 AM  CMP  Glucose 70 - 99 mg/dL 116  87  88   BUN 8 - 23 mg/dL 37  46  42   Creatinine 0.44 - 1.00 mg/dL 0.98  1.00  0.92   Sodium 135 - 145 mmol/L 132  131  131   Potassium 3.5 - 5.1 mmol/L 4.5  4.7  4.9   Chloride 98 - 111 mmol/L 98  98  97   CO2 22 - 32 mmol/L 28  28  26    Calcium 8.9 - 10.3  mg/dL 8.8  8.8  8.7   Total Protein 6.5 - 8.1 g/dL 5.8  6.2  5.6   Total Bilirubin 0.3 - 1.2 mg/dL 0.9  0.5  0.8   Alkaline Phos 38 - 126 U/L 55  53  55   AST 15 - 41 U/L 13  16  14    ALT 0 - 44 U/L 14  12  11      Lab Results  Component Value Date   MPROTEIN 0.7 (H) 12/26/2022   MPROTEIN 1.0 (H) 11/28/2022   MPROTEIN 1.3 (H) 10/31/2022   Lab Results  Component Value Date   KPAFRELGTCHN 12.6 12/26/2022   KPAFRELGTCHN 10.3 11/28/2022   KPAFRELGTCHN 11.9 10/31/2022   LAMBDASER 9.5 12/26/2022   LAMBDASER 5.9 11/28/2022   LAMBDASER 7.9 10/31/2022   KAPLAMBRATIO 1.33 12/26/2022   KAPLAMBRATIO 1.75 (H) 11/28/2022   KAPLAMBRATIO 1.51 10/31/2022  RADIOGRAPHIC STUDIES: No results found.  Zoe [name] 89 y.o. female with medical history significant for multiple myeloma who presents for a follow up visit.   After review of the labs, review the records, discussion with the patient the findings are most consistent with an IgG lambda multiple myeloma.  The patient meets the diagnostic criteria based on 60% plasma cells in the bone marrow and anemia.  Additionally the patient was noted to have vitamin B12 deficiency which is currently being treated as well.  Given her degree of anemia would recommend proceeding with VD chemotherapy and will add Revlimid once hemoglobin improves.  The patient and her daughter voiced understanding of the plan moving forward.  # IgG Lambda Multiple Myeloma  --diagnosis of MM confirmed with bone marrow biopsy showing 60% plasma cells and anemia -- Bone survey shows no lytic lesions, kidney function is within normal limits.  --08/01/2022 was Cycle 1 Day 1 of VRd  Plan:  --Today is Cycle 9 Day 1 of VRd chemotherapy.  --Labs show white blood cell count 8.4, hemoglobin 13.8, MCV 87.6, and platelets of 97 --last M protein from 11/28/2022 trended down to 0.7 (4.9 prior to start of therapy). Normalized SFLC.  --at each visit will collect  CBC, CMP, and LDH with monthly restaging labs SPEP and SFLC.  --RTC in 2 weeks with interval weekly treatment.   # Rash -- Potentially a side effect of the Revlimid therapy. -- Rash is not painful or itchy. -- Prescribed triamcinolone ointment today to see if this helps with her rash.  #Hypotension-worsening -- BP down to 82/50 today --Patient is symptomatic with some lightheadedness and blurry vision. -- Recommend holding her Lasix for the next 3 days.  We provided her with 500 cc of fluid today in order to try to help bolster her blood pressure -- Recommend she continue her carvedilol therapy but hold losartan as well. -- Encouraged her to check her blood pressure levels at home.  #Bilateral lower extremity edema: --Currently taking Lasix 20 mg BID. Advised to follow up with cardiologist, Dr. Einar Gip regarding dosing.  --Patient is currently taking potassium chloride 10 mEq twice daily.  --Continue to wear compression stockings.   # Normocytic Anemia #Vitamin B12 Deficiency --Anemia likely driven by multiple myeloma, but may be component of vitamin B12 deficiency as well. --initial labs showed elevated MMA with B12 180 -- Continue vitamin B12 1000 mcg p.o. daily -- Continue to monitor  #Left leg/thigh neuropathy: --Currently on gabapentin 900 mg nightly and 600 mg in the morning --Encouraged to try water aerobics and stretches.  --Following with Dr. Dietrich Pates (ortho) to see if she is a candidate for steroid injections.  -- Patient evaluated by Dr. Mickeal Skinner thinks that this may be neuropathy worsened by her Velcade  #Supportive Care -- chemotherapy education complete -- port placement not required.  --Awaiting to start Zometa/Xgeva  -- zofran 8mg  q8H PRN and compazine 10mg  PO q6H for nausea -- acyclovir 400mg  PO BID for VCZ prophylaxis -- tylenol 1000 mg q8H PRN for back pain.   No orders of the defined types were placed in this encounter.   All questions were answered. The  patient knows to call the clinic with any problems, questions or concerns.  A total of more than 30 minutes were spent on this encounter with face-to-face time and non-face-to-face time, including preparing to see the patient, ordering tests and/or medications, counseling the patient and coordination of care as outlined above.   Jenny Reichmann  Lauretta Grill, MD Department of Hematology/Oncology [place] [facility] Phone: [phone] Pager: [phone] Email: [email or web address]   01/22/2023 2:33 PM

## 2023-01-22 ENCOUNTER — Inpatient Hospital Stay: Payer: Medicare Other

## 2023-01-22 ENCOUNTER — Other Ambulatory Visit: Payer: Self-pay | Admitting: *Deleted

## 2023-01-22 ENCOUNTER — Other Ambulatory Visit: Payer: Self-pay

## 2023-01-22 ENCOUNTER — Inpatient Hospital Stay: Payer: Medicare Other | Attending: Physician Assistant

## 2023-01-22 ENCOUNTER — Inpatient Hospital Stay: Payer: Medicare Other | Admitting: Hematology and Oncology

## 2023-01-22 VITALS — BP 100/45 | HR 67

## 2023-01-22 VITALS — BP 82/50 | HR 68 | Temp 97.7°F | Resp 18 | Wt 155.2 lb

## 2023-01-22 DIAGNOSIS — C9000 Multiple myeloma not having achieved remission: Secondary | ICD-10-CM

## 2023-01-22 DIAGNOSIS — R21 Rash and other nonspecific skin eruption: Secondary | ICD-10-CM | POA: Insufficient documentation

## 2023-01-22 DIAGNOSIS — I959 Hypotension, unspecified: Secondary | ICD-10-CM | POA: Insufficient documentation

## 2023-01-22 DIAGNOSIS — D649 Anemia, unspecified: Secondary | ICD-10-CM | POA: Insufficient documentation

## 2023-01-22 DIAGNOSIS — Z5112 Encounter for antineoplastic immunotherapy: Secondary | ICD-10-CM | POA: Diagnosis not present

## 2023-01-22 DIAGNOSIS — E538 Deficiency of other specified B group vitamins: Secondary | ICD-10-CM | POA: Insufficient documentation

## 2023-01-22 DIAGNOSIS — R600 Localized edema: Secondary | ICD-10-CM

## 2023-01-22 LAB — CBC WITH DIFFERENTIAL (CANCER CENTER ONLY)
Abs Immature Granulocytes: 0.06 10*3/uL (ref 0.00–0.07)
Basophils Absolute: 0.1 10*3/uL (ref 0.0–0.1)
Basophils Relative: 1 %
Eosinophils Absolute: 0.8 10*3/uL — ABNORMAL HIGH (ref 0.0–0.5)
Eosinophils Relative: 9 %
HCT: 41.7 % (ref 36.0–46.0)
Hemoglobin: 13.8 g/dL (ref 12.0–15.0)
Immature Granulocytes: 1 %
Lymphocytes Relative: 7 %
Lymphs Abs: 0.6 10*3/uL — ABNORMAL LOW (ref 0.7–4.0)
MCH: 29 pg (ref 26.0–34.0)
MCHC: 33.1 g/dL (ref 30.0–36.0)
MCV: 87.6 fL (ref 80.0–100.0)
Monocytes Absolute: 1.4 10*3/uL — ABNORMAL HIGH (ref 0.1–1.0)
Monocytes Relative: 17 %
Neutro Abs: 5.5 10*3/uL (ref 1.7–7.7)
Neutrophils Relative %: 65 %
Platelet Count: 97 10*3/uL — ABNORMAL LOW (ref 150–400)
RBC: 4.76 MIL/uL (ref 3.87–5.11)
RDW: 16.4 % — ABNORMAL HIGH (ref 11.5–15.5)
WBC Count: 8.4 10*3/uL (ref 4.0–10.5)
nRBC: 0 % (ref 0.0–0.2)

## 2023-01-22 LAB — CMP (CANCER CENTER ONLY)
ALT: 14 U/L (ref 0–44)
AST: 13 U/L — ABNORMAL LOW (ref 15–41)
Albumin: 3.4 g/dL — ABNORMAL LOW (ref 3.5–5.0)
Alkaline Phosphatase: 55 U/L (ref 38–126)
Anion gap: 6 (ref 5–15)
BUN: 37 mg/dL — ABNORMAL HIGH (ref 8–23)
CO2: 28 mmol/L (ref 22–32)
Calcium: 8.8 mg/dL — ABNORMAL LOW (ref 8.9–10.3)
Chloride: 98 mmol/L (ref 98–111)
Creatinine: 0.98 mg/dL (ref 0.44–1.00)
GFR, Estimated: 56 mL/min — ABNORMAL LOW (ref >60)
Glucose, Bld: 116 mg/dL — ABNORMAL HIGH (ref 70–99)
Potassium: 4.5 mmol/L (ref 3.5–5.1)
Sodium: 132 mmol/L — ABNORMAL LOW (ref 135–145)
Total Bilirubin: 0.9 mg/dL (ref 0.3–1.2)
Total Protein: 5.8 g/dL — ABNORMAL LOW (ref 6.5–8.1)

## 2023-01-22 MED ORDER — SODIUM CHLORIDE 0.9 % IV SOLN: INTRAVENOUS | Status: DC

## 2023-01-22 MED ORDER — LENALIDOMIDE 25 MG PO CAPS
25.0000 mg | ORAL_CAPSULE | Freq: Every day | ORAL | 0 refills | Status: DC
Start: 2023-01-22 — End: 2023-02-08

## 2023-01-22 MED ORDER — BORTEZOMIB CHEMO SQ INJECTION 3.5 MG (2.5MG/ML)
1.3000 mg/m2 | Freq: Once | INTRAMUSCULAR | Status: AC
  Administered 2023-01-22: 2.25 mg via SUBCUTANEOUS
  Filled 2023-01-22: qty 0.9

## 2023-01-22 MED ORDER — TRIAMCINOLONE ACETONIDE 0.5 % EX OINT: 1.0000 | TOPICAL_OINTMENT | Freq: Two times a day (BID) | CUTANEOUS | 0 refills | Status: DC

## 2023-01-22 NOTE — Telephone Encounter (Signed)
Patient called top request refill of Revlimid. Refilled per [facility] note 01/22/23.

## 2023-01-22 NOTE — Patient Instructions (Signed)
[facility]  Discharge Instructions: Thank you for choosing [facility] to provide your oncology and hematology care.   If you have a lab appointment with the [facility], please go directly to the [facility] and check in at the registration area.   Wear comfortable clothing and clothing appropriate for easy access to any Portacath or PICC line.   We strive to give you quality time with your provider. You may need to reschedule your appointment if you arrive late (15 or more minutes).  Arriving late affects you and other patients whose appointments are after yours.  Also, if you miss three or more appointments without notifying the office, you may be dismissed from the clinic at the provider's discretion.      For prescription refill requests, have your pharmacy contact our office and allow 72 hours for refills to be completed.    Today you received the following chemotherapy and/or immunotherapy agent: Bortezomib (Velcade)   To help prevent nausea and vomiting after your treatment, we encourage you to take your nausea medication as directed.  BELOW ARE SYMPTOMS THAT SHOULD BE REPORTED IMMEDIATELY: *FEVER GREATER THAN 100.4 F (38 C) OR HIGHER *CHILLS OR SWEATING *NAUSEA AND VOMITING THAT IS NOT CONTROLLED WITH YOUR NAUSEA MEDICATION *UNUSUAL SHORTNESS OF BREATH *UNUSUAL BRUISING OR BLEEDING *URINARY PROBLEMS (pain or burning when urinating, or frequent urination) *BOWEL PROBLEMS (unusual diarrhea, constipation, pain near the anus) TENDERNESS IN MOUTH AND THROAT WITH OR WITHOUT PRESENCE OF ULCERS (sore throat, sores in mouth, or a toothache) UNUSUAL RASH, SWELLING OR PAIN  UNUSUAL VAGINAL DISCHARGE OR ITCHING   Items with * indicate a potential emergency and should be followed up as soon as possible or go to the Emergency Department if any problems should occur.  Please show the CHEMOTHERAPY ALERT CARD or IMMUNOTHERAPY ALERT CARD  at check-in to the Emergency Department and triage nurse.  Should you have questions after your visit or need to cancel or reschedule your appointment, please contact [facility]  Dept: [phone]  and follow the prompts.  Office hours are 8:00 a.m. to 4:30 p.m. Monday - Friday. Please note that voicemails left after 4:00 p.m. may not be returned until the following business day.  We are closed weekends and major holidays. You have access to a nurse at all times for urgent questions. Please call the main number to the clinic Dept: [phone] and follow the prompts.   For any non-urgent questions, you may also contact your provider using MyChart. We now offer e-Visits for anyone 26 and older to request care online for non-urgent symptoms. For details visit [email or web address].   Also download the MyChart app! Go to the app store, search "MyChart", open the app, select [facility], and log in with your MyChart username and password. Bortezomib Injection What is this medication? BORTEZOMIB (bor TEZ oh mib) treats lymphoma. It may also be used to treat multiple myeloma, a type of bone marrow cancer. It works by blocking a protein that causes cancer cells to grow and multiply. This helps to slow or stop the spread of cancer cells. This medicine may be used for other purposes; ask your health care provider or pharmacist if you have questions. COMMON BRAND NAME(S): Velcade What should I tell my care team before I take this medication? They need to know if you have any of these conditions: Dehydration Diabetes Heart disease Liver disease Tingling of the fingers  or toes or other nerve disorder An unusual or allergic reaction to bortezomib, other medications, foods, dyes, or preservatives If you or your partner are pregnant or trying to get pregnant Breastfeeding How should I use this medication? This medication is injected into a vein or under the skin. It  is given by your care team in a hospital or clinic setting. Talk to your care team about the use of this medication in children. Special care may be needed. Overdosage: If you think you have taken too much of this medicine contact a poison control center or emergency room at once. NOTE: This medicine is only for you. Do not share this medicine with others. What if I miss a dose? Keep appointments for follow-up doses. It is important not to miss your dose. Call your care team if you are unable to keep an appointment. What may interact with this medication? Ketoconazole Rifampin This list may not describe all possible interactions. Give your health care provider a list of all the medicines, herbs, non-prescription drugs, or dietary supplements you use. Also tell them if you smoke, drink alcohol, or use illegal drugs. Some items may interact with your medicine. What should I watch for while using this medication? Your condition will be monitored carefully while you are receiving this medication. You may need blood work while taking this medication. This medication may affect your coordination, reaction time, or judgment. Do not drive or operate machinery until you know how this medication affects you. Sit up or stand slowly to reduce the risk of dizzy or fainting spells. Drinking alcohol with this medication can increase the risk of these side effects. This medication may increase your risk of getting an infection. Call your care team for advice if you get a fever, chills, sore throat, or other symptoms of a cold or flu. Do not treat yourself. Try to avoid being around people who are sick. Check with your care team if you have severe diarrhea, nausea, and vomiting, or if you sweat a lot. The loss of too much body fluid may make it dangerous for you to take this medication. Talk to your care team if you may be pregnant. Serious birth defects can occur if you take this medication during pregnancy and for 7  months after the last dose. You will need a negative pregnancy test before starting this medication. Contraception is recommended while taking this medication and for 7 months after the last dose. Your care team can help you find the option that works for you. If your partner can get pregnant, use a condom during sex while taking this medication and for 4 months after the last dose. Do not breastfeed while taking this medication and for 2 months after the last dose. This medication may cause infertility. Talk to your care team if you are concerned about your fertility. What side effects may I notice from receiving this medication? Side effects that you should report to your care team as soon as possible: Allergic reactions--skin rash, itching, hives, swelling of the face, lips, tongue, or throat Bleeding--bloody or black, tar-like stools, vomiting blood or brown material that looks like coffee grounds, red or dark brown urine, small red or purple spots on skin, unusual bruising or bleeding Bleeding in the brain--severe headache, stiff neck, confusion, dizziness, change in vision, numbness or weakness of the face, arm, or leg, trouble speaking, trouble walking, vomiting Bowel blockage--stomach cramping, unable to have a bowel movement or pass gas, loss of appetite, vomiting Heart  failure--shortness of breath, swelling of the ankles, feet, or hands, sudden weight gain, unusual weakness or fatigue Infection--fever, chills, cough, sore throat, wounds that don't heal, pain or trouble when passing urine, general feeling of discomfort or being unwell Liver injury--right upper belly pain, loss of appetite, nausea, light-colored stool, dark yellow or brown urine, yellowing skin or eyes, unusual weakness or fatigue Low blood pressure--dizziness, feeling faint or lightheaded, blurry vision Lung injury--shortness of breath or trouble breathing, cough, spitting up blood, chest pain, fever Pain, tingling, or  numbness in the hands or feet Severe or prolonged diarrhea Stomach pain, bloody diarrhea, pale skin, unusual weakness or fatigue, decrease in the amount of urine, which may be signs of hemolytic uremic syndrome Sudden and severe headache, confusion, change in vision, seizures, which may be signs of posterior reversible encephalopathy syndrome (PRES) TTP--purple spots on the skin or inside the mouth, pale skin, yellowing skin or eyes, unusual weakness or fatigue, fever, fast or irregularRehydration, Older Adult  Rehydration is the replacement of fluids, salts, and minerals in the body (electrolytes) that are lost during dehydration. Dehydration is when there is not enough water or other fluids in the body. This happens when you lose more fluids than you take in. People who are age 48 or older have a higher risk of dehydration than younger adults. This is because in older age, the body: Is less able to maintain the right amount of water. Does not respond to temperature changes as well. Does not get a sense of thirst as easily or quickly. Other causes include: Not drinking enough fluids. This can occur when you are ill, when you forget to drink, or when you are doing activities that require a lot of energy, especially in hot weather. Conditions that cause loss of water or other fluids. These include diarrhea, vomiting, sweating, or urinating a lot. Other illnesses, such as fever or infection. Certain medicines, such as those that remove excess fluid from the body (diuretics). Symptoms of mild or moderate dehydration may include thirst, dry lips and mouth, and dizziness. Symptoms of severe dehydration may include increased heart rate, confusion, fainting, and not urinating. In severe cases, you may need to get fluids through an IV at the hospital. For mild or moderate cases, you can usually rehydrate at home by drinking certain fluids as told by your health care provider. What are the  risks? Rehydration is usually safe. Taking in too much fluid (overhydration) can be a problem but is rare. Overhydration can cause an imbalance of electrolytes in the body, kidney failure, fluid in the lungs, or a decrease in salt (sodium) levels in the body. Supplies needed: You will need an oral rehydration solution (ORS) if your health care provider tells you to use one. This is a drink to treat dehydration. It can be found in pharmacies and retail stores. How to rehydrate Fluids Follow instructions from your health care provider about what to drink. The kind of fluid and the amount you should drink depend on your condition. In general, you should choose drinks that you prefer. If told by your health care provider, drink an ORS. Make an ORS by following instructions on the package. Start by drinking small amounts, about  cup (120 mL) every 5-10 minutes. Slowly increase how much you drink until you have taken in the amount recommended by your health care provider. Drink enough clear fluids to keep your urine pale yellow. If you were told to drink an ORS, finish it first, then start  slowly drinking other clear fluids. Drink fluids such as: Water. This includes sparkling and flavored water. Drinking only water can lead to having too little sodium in your body (hyponatremia). Follow the advice of your health care provider. Water from ice chips you suck on. Fruit juice with water added to it(diluted). Sports drinks. Hot or cold herbal teas. Broth-based soups. Coffee. Milk or milk products. Food Follow instructions from your health care provider about what to eat while you rehydrate. Your health care provider may recommend that you slowly begin eating regular foods in small amounts. Eat foods that contain a healthy balance of electrolytes, such as bananas, oranges, potatoes, tomatoes, and spinach. Avoid foods that are greasy or contain a lot of sugar. In some cases, you may get nutrition  through a feeding tube that is passed through your nose and into your stomach (nasogastric tube, or NG tube). This may be done if you have uncontrolled vomiting or diarrhea. Drinks to avoid  Certain drinks may make dehydration worse. While you rehydrate, avoid drinking alcohol. How to tell if you are recovering from dehydration You may be getting better if: You are urinating more often than before you started rehydrating. Your urine is pale yellow. Your energy level improves. You vomit less often. You have diarrhea less often. Your appetite improves or returns to normal. You feel less dizzy or light-headed. Your skin tone and color start to look more normal. Follow these instructions at home: Take over-the-counter and prescription medicines only as told by your health care provider. Do not take sodium tablets. Doing this can lead to having too much sodium in your body (hypernatremia). Contact a health care provider if: You continue to have symptoms of mild or moderate dehydration, such as: Thirst. Dry lips. Slightly dry mouth. Dizziness. Dark urine or less urine than usual. Muscle cramps. You continue to vomit or have diarrhea. Get help right away if: You have symptoms of dehydration that get worse. You have a fever. You have a severe headache. You have been vomiting and have problems, such as: Your vomiting gets worse. Your vomit includes blood or green matter (bile). You cannot eat or drink without vomiting. You have problems with urination or bowel movements, such as: Diarrhea that gets worse. Blood in your stool (feces). This may cause stool to look black and tarry. Not urinating, or urinating only a small amount of very dark urine, within 6-8 hours. You have trouble breathing. You have symptoms that get worse with treatment. These symptoms may be an emergency. Get help right away. Call 911. Do not wait to see if the symptoms will go away. Do not drive yourself to the  hospital. This information is not intended to replace advice given to you by your health care provider. Make sure you discuss any questions you have with your health care provider. Document Revised: 02/22/2022 Document Reviewed: 02/20/2022 Elsevier Patient Education  [place].  heartbeat, confusion, change in vision, trouble speaking, trouble walking Tumor lysis syndrome (TLS)--nausea, vomiting, diarrhea, decrease in the amount of urine, dark urine, unusual weakness or fatigue, confusion, muscle pain or cramps, fast or irregular heartbeat, joint pain Side effects that usually do not require medical attention (report to your care team if they continue or are bothersome): Constipation Diarrhea Fatigue Loss of appetite Nausea This list may not describe all possible side effects. Call your doctor for medical advice about side effects. You may report side effects to FDA at 1-800-FDA-1088. Where should I keep my medication? This  medication is given in a hospital or clinic. It will not be stored at home. NOTE: This sheet is a summary. It may not cover all possible information. If you have questions about this medicine, talk to your doctor, pharmacist, or health care provider.  2023 Elsevier/Gold Standard (2022-03-08 00:00:00)

## 2023-01-22 NOTE — Progress Notes (Signed)
Per Dr. Lorenso Courier, ok for treatment today with low blood pressure and platelets 97 K/uL. Pt. states she took Decadron 20 mg po this am. Per Dr. Lorenso Courier, ok to discharge with blood pressure 100/45. Pt. denies chest pain, dizziness, and no shortness of breath noted.

## 2023-01-25 ENCOUNTER — Inpatient Hospital Stay: Payer: Medicare Other

## 2023-01-25 ENCOUNTER — Other Ambulatory Visit: Payer: Self-pay

## 2023-01-25 NOTE — Progress Notes (Signed)
Patient's BP has improved since her last visit and she declines IV fluids at this time. Patient reports that she has had adequate PO intake and is feeling less dizzy than her previous visit. Dr. Lorenso Courier notified of blood pressure of 111/55 and he advises that patient is safe to leave [facility]. Per Dr. Lorenso Courier, patient should be receiving a phone call from her cardiologist regarding the dosing of her losartan, potassium and lasix. Patient verbalized understanding that she should continue to hold off taking these medications until she speaks with her cardiologist. This RN advised patient to contact Dr. Libby Maw office should her systolic BP read below [id number] at home or if she feels dizzy/lightheaded. Patient verbalized thanks and understanding, no further questions or concerns at this time.

## 2023-01-26 ENCOUNTER — Telehealth: Payer: Self-pay

## 2023-01-26 NOTE — Telephone Encounter (Signed)
So do not resume bp meds?

## 2023-01-26 NOTE — Telephone Encounter (Signed)
No, do not want her to. I am aware. Dr. Leonides Schanz send me the message

## 2023-01-26 NOTE — Telephone Encounter (Signed)
Patient called and said her cancer doctor, Dr. Leonides Schanz stopped her BP meds for 3 days Losartan/ lasix/ potassium due to extremely low bp.Her Bp is back to normal now  116/76. She wants to know if she can resume her current dose of meds or hold off for now. ?

## 2023-01-26 NOTE — Telephone Encounter (Signed)
NO she should not

## 2023-01-29 ENCOUNTER — Inpatient Hospital Stay: Payer: Medicare Other

## 2023-01-29 ENCOUNTER — Other Ambulatory Visit: Payer: Self-pay

## 2023-01-29 VITALS — BP 121/56 | HR 65 | Temp 97.9°F | Resp 18 | Wt 162.8 lb

## 2023-01-29 DIAGNOSIS — C9000 Multiple myeloma not having achieved remission: Secondary | ICD-10-CM

## 2023-01-29 DIAGNOSIS — Z5112 Encounter for antineoplastic immunotherapy: Secondary | ICD-10-CM | POA: Diagnosis not present

## 2023-01-29 DIAGNOSIS — I959 Hypotension, unspecified: Secondary | ICD-10-CM | POA: Diagnosis not present

## 2023-01-29 DIAGNOSIS — R21 Rash and other nonspecific skin eruption: Secondary | ICD-10-CM | POA: Diagnosis not present

## 2023-01-29 DIAGNOSIS — E538 Deficiency of other specified B group vitamins: Secondary | ICD-10-CM | POA: Diagnosis not present

## 2023-01-29 DIAGNOSIS — D649 Anemia, unspecified: Secondary | ICD-10-CM | POA: Diagnosis not present

## 2023-01-29 LAB — CBC WITH DIFFERENTIAL (CANCER CENTER ONLY)
Abs Immature Granulocytes: 0.01 10*3/uL (ref 0.00–0.07)
Basophils Absolute: 0 10*3/uL (ref 0.0–0.1)
Basophils Relative: 1 %
Eosinophils Absolute: 0.2 10*3/uL (ref 0.0–0.5)
Eosinophils Relative: 3 %
HCT: 35.8 % — ABNORMAL LOW (ref 36.0–46.0)
Hemoglobin: 12.1 g/dL (ref 12.0–15.0)
Immature Granulocytes: 0 %
Lymphocytes Relative: 6 %
Lymphs Abs: 0.4 10*3/uL — ABNORMAL LOW (ref 0.7–4.0)
MCH: 29.4 pg (ref 26.0–34.0)
MCHC: 33.8 g/dL (ref 30.0–36.0)
MCV: 87.1 fL (ref 80.0–100.0)
Monocytes Absolute: 1.1 10*3/uL — ABNORMAL HIGH (ref 0.1–1.0)
Monocytes Relative: 17 %
Neutro Abs: 4.8 10*3/uL (ref 1.7–7.7)
Neutrophils Relative %: 73 %
Platelet Count: 181 10*3/uL (ref 150–400)
RBC: 4.11 MIL/uL (ref 3.87–5.11)
RDW: 16.5 % — ABNORMAL HIGH (ref 11.5–15.5)
WBC Count: 6.5 10*3/uL (ref 4.0–10.5)
nRBC: 0 % (ref 0.0–0.2)

## 2023-01-29 LAB — CMP (CANCER CENTER ONLY)
ALT: 13 U/L (ref 0–44)
AST: 12 U/L — ABNORMAL LOW (ref 15–41)
Albumin: 3.2 g/dL — ABNORMAL LOW (ref 3.5–5.0)
Alkaline Phosphatase: 55 U/L (ref 38–126)
Anion gap: 6 (ref 5–15)
BUN: 23 mg/dL (ref 8–23)
CO2: 26 mmol/L (ref 22–32)
Calcium: 8.5 mg/dL — ABNORMAL LOW (ref 8.9–10.3)
Chloride: 103 mmol/L (ref 98–111)
Creatinine: 0.69 mg/dL (ref 0.44–1.00)
GFR, Estimated: >60 mL/min (ref >60)
Glucose, Bld: 119 mg/dL — ABNORMAL HIGH (ref 70–99)
Potassium: 3.8 mmol/L (ref 3.5–5.1)
Sodium: 135 mmol/L (ref 135–145)
Total Bilirubin: 0.5 mg/dL (ref 0.3–1.2)
Total Protein: 5.4 g/dL — ABNORMAL LOW (ref 6.5–8.1)

## 2023-01-29 MED ORDER — BORTEZOMIB CHEMO SQ INJECTION 3.5 MG (2.5MG/ML)
1.3000 mg/m2 | Freq: Once | INTRAMUSCULAR | Status: AC
  Administered 2023-01-29: 2.25 mg via SUBCUTANEOUS
  Filled 2023-01-29: qty 0.9

## 2023-01-29 NOTE — Progress Notes (Signed)
Pt reported to infusion today with questions regarding having a dental appt to have a bridge placed. This RN made Dr. Leonides Schanz aware. Per Dr. Leonides Schanz Pt is OK to have the bridge placed as the chemo Pt is on should not affect the dental work.This RN made Pt and family aware of Dr. Derek Mound response. Pt and family verbalized understanding, expressed she was happy abut being able to have a bridge placed.  Pt also had c/o worsening of persistent rash on upper and lower extremities. Pt stated "I only put the cream they prescribed me on my chest and belly." Upon assessment a red rash was noted on the upper and lower extremities. Pt's trunk was noted to have two small rash spots, per the Pt's daughter this has cleared up considerably. This RN made Dr. Leonides Schanz aware. Per Dr. Leonides Schanz Pt should placed prescribed cream on all rash sites. This RN educated Pt on how to apply cream and where to apply cream. Pt and family verbalized understanding.   This RN confirmed with Pt that she took premedications at home. Pt stated "I took them before I got in the car this morning".

## 2023-01-29 NOTE — Patient Instructions (Signed)
[facility]  Discharge Instructions: Thank you for choosing [facility] to provide your oncology and hematology care.   If you have a lab appointment with the Cancer Center, please go directly to the Cancer Center and check in at the registration area.   Wear comfortable clothing and clothing appropriate for easy access to any Portacath or PICC line.   We strive to give you quality time with your provider. You may need to reschedule your appointment if you arrive late (15 or more minutes).  Arriving late affects you and other patients whose appointments are after yours.  Also, if you miss three or more appointments without notifying the office, you may be dismissed from the clinic at the provider's discretion.      For prescription refill requests, have your pharmacy contact our office and allow 72 hours for refills to be completed.    Today you received the following chemotherapy and/or immunotherapy agents: Velcade     To help prevent nausea and vomiting after your treatment, we encourage you to take your nausea medication as directed.  BELOW ARE SYMPTOMS THAT SHOULD BE REPORTED IMMEDIATELY: *FEVER GREATER THAN 100.4 F (38 C) OR HIGHER *CHILLS OR SWEATING *NAUSEA AND VOMITING THAT IS NOT CONTROLLED WITH YOUR NAUSEA MEDICATION *UNUSUAL SHORTNESS OF BREATH *UNUSUAL BRUISING OR BLEEDING *URINARY PROBLEMS (pain or burning when urinating, or frequent urination) *BOWEL PROBLEMS (unusual diarrhea, constipation, pain near the anus) TENDERNESS IN MOUTH AND THROAT WITH OR WITHOUT PRESENCE OF ULCERS (sore throat, sores in mouth, or a toothache) UNUSUAL RASH, SWELLING OR PAIN  UNUSUAL VAGINAL DISCHARGE OR ITCHING   Items with * indicate a potential emergency and should be followed up as soon as possible or go to the Emergency Department if any problems should occur.  Please show the CHEMOTHERAPY ALERT CARD or IMMUNOTHERAPY ALERT CARD at check-in  to the Emergency Department and triage nurse.  Should you have questions after your visit or need to cancel or reschedule your appointment, please contact [facility]  Dept: [phone]  and follow the prompts.  Office hours are 8:00 a.m. to 4:30 p.m. Monday - Friday. Please note that voicemails left after 4:00 p.m. may not be returned until the following business day.  We are closed weekends and major holidays. You have access to a nurse at all times for urgent questions. Please call the main number to the clinic Dept: [phone] and follow the prompts.   For any non-urgent questions, you may also contact your provider using MyChart. We now offer e-Visits for anyone 18 and older to request care online for non-urgent symptoms. For details visit [email or web address].   Also download the MyChart app! Go to the app store, search "MyChart", open the app, select [facility], and log in with your MyChart username and password.   

## 2023-01-30 ENCOUNTER — Other Ambulatory Visit: Payer: Self-pay | Admitting: Cardiology

## 2023-01-30 ENCOUNTER — Telehealth: Payer: Self-pay | Admitting: *Deleted

## 2023-01-30 ENCOUNTER — Encounter: Payer: Self-pay | Admitting: *Deleted

## 2023-01-30 DIAGNOSIS — I5033 Acute on chronic diastolic (congestive) heart failure: Secondary | ICD-10-CM

## 2023-01-30 NOTE — Telephone Encounter (Signed)
Faxed letter to [facility] that it is OK to proceed with procedure per Dr Leonides Schanz

## 2023-01-31 ENCOUNTER — Telehealth: Payer: Self-pay

## 2023-01-31 NOTE — Telephone Encounter (Signed)
Patient currently has a bottle of Furosemide 40mg , but was taking 20mg , does she need to take 20mg  or 40mg ? Please advise.

## 2023-01-31 NOTE — Telephone Encounter (Signed)
Restart Lasix every other day.  Also wear support stockings during the day.  If blood pressure goes down <110 mmHg systolic to let us know.

## 2023-01-31 NOTE — Telephone Encounter (Signed)
Patient called and stated that she was to call our office if she gained any weight and she has gained 4lbs since Saturday, but has made sure to keep sodium intake low. Do you want her to start taking the Lasix now or come in for an appointment  ro see you. Please advise.

## 2023-01-31 NOTE — Telephone Encounter (Signed)
20 mg daily only if no response then 40 mg

## 2023-02-02 NOTE — Telephone Encounter (Signed)
Patient is aware 

## 2023-02-05 ENCOUNTER — Other Ambulatory Visit: Payer: Self-pay

## 2023-02-05 ENCOUNTER — Inpatient Hospital Stay: Payer: Medicare Other | Admitting: Hematology and Oncology

## 2023-02-05 ENCOUNTER — Inpatient Hospital Stay: Payer: Medicare Other

## 2023-02-05 ENCOUNTER — Telehealth: Payer: Self-pay | Admitting: Hematology and Oncology

## 2023-02-05 VITALS — BP 92/53 | HR 64 | Temp 97.6°F | Resp 13 | Wt 164.9 lb

## 2023-02-05 VITALS — BP 109/54 | HR 64 | Resp 16

## 2023-02-05 DIAGNOSIS — Z5112 Encounter for antineoplastic immunotherapy: Secondary | ICD-10-CM | POA: Diagnosis not present

## 2023-02-05 DIAGNOSIS — R600 Localized edema: Secondary | ICD-10-CM | POA: Diagnosis not present

## 2023-02-05 DIAGNOSIS — C9000 Multiple myeloma not having achieved remission: Secondary | ICD-10-CM

## 2023-02-05 DIAGNOSIS — M545 Low back pain, unspecified: Secondary | ICD-10-CM | POA: Diagnosis not present

## 2023-02-05 DIAGNOSIS — I959 Hypotension, unspecified: Secondary | ICD-10-CM | POA: Diagnosis not present

## 2023-02-05 DIAGNOSIS — M6281 Muscle weakness (generalized): Secondary | ICD-10-CM | POA: Diagnosis not present

## 2023-02-05 DIAGNOSIS — R21 Rash and other nonspecific skin eruption: Secondary | ICD-10-CM

## 2023-02-05 DIAGNOSIS — M546 Pain in thoracic spine: Secondary | ICD-10-CM | POA: Diagnosis not present

## 2023-02-05 DIAGNOSIS — D649 Anemia, unspecified: Secondary | ICD-10-CM | POA: Diagnosis not present

## 2023-02-05 DIAGNOSIS — E538 Deficiency of other specified B group vitamins: Secondary | ICD-10-CM | POA: Diagnosis not present

## 2023-02-05 DIAGNOSIS — R2689 Other abnormalities of gait and mobility: Secondary | ICD-10-CM | POA: Diagnosis not present

## 2023-02-05 LAB — CMP (CANCER CENTER ONLY)
ALT: 12 U/L (ref 0–44)
AST: 14 U/L — ABNORMAL LOW (ref 15–41)
Albumin: 3.2 g/dL — ABNORMAL LOW (ref 3.5–5.0)
Alkaline Phosphatase: 62 U/L (ref 38–126)
Anion gap: 7 (ref 5–15)
BUN: 15 mg/dL (ref 8–23)
CO2: 29 mmol/L (ref 22–32)
Calcium: 8.4 mg/dL — ABNORMAL LOW (ref 8.9–10.3)
Chloride: 100 mmol/L (ref 98–111)
Creatinine: 0.74 mg/dL (ref 0.44–1.00)
GFR, Estimated: >60 mL/min (ref >60)
Glucose, Bld: 119 mg/dL — ABNORMAL HIGH (ref 70–99)
Potassium: 3.4 mmol/L — ABNORMAL LOW (ref 3.5–5.1)
Sodium: 136 mmol/L (ref 135–145)
Total Bilirubin: 1 mg/dL (ref 0.3–1.2)
Total Protein: 5.4 g/dL — ABNORMAL LOW (ref 6.5–8.1)

## 2023-02-05 LAB — CBC WITH DIFFERENTIAL (CANCER CENTER ONLY)
Abs Immature Granulocytes: 0.02 10*3/uL (ref 0.00–0.07)
Basophils Absolute: 0 10*3/uL (ref 0.0–0.1)
Basophils Relative: 1 %
Eosinophils Absolute: 0.4 10*3/uL (ref 0.0–0.5)
Eosinophils Relative: 8 %
HCT: 35.2 % — ABNORMAL LOW (ref 36.0–46.0)
Hemoglobin: 11.9 g/dL — ABNORMAL LOW (ref 12.0–15.0)
Immature Granulocytes: 0 %
Lymphocytes Relative: 8 %
Lymphs Abs: 0.4 10*3/uL — ABNORMAL LOW (ref 0.7–4.0)
MCH: 29.7 pg (ref 26.0–34.0)
MCHC: 33.8 g/dL (ref 30.0–36.0)
MCV: 87.8 fL (ref 80.0–100.0)
Monocytes Absolute: 0.5 10*3/uL (ref 0.1–1.0)
Monocytes Relative: 11 %
Neutro Abs: 3.2 10*3/uL (ref 1.7–7.7)
Neutrophils Relative %: 72 %
Platelet Count: 168 10*3/uL (ref 150–400)
RBC: 4.01 MIL/uL (ref 3.87–5.11)
RDW: 17.1 % — ABNORMAL HIGH (ref 11.5–15.5)
WBC Count: 4.5 10*3/uL (ref 4.0–10.5)
nRBC: 0 % (ref 0.0–0.2)

## 2023-02-05 MED ORDER — BORTEZOMIB CHEMO SQ INJECTION 3.5 MG (2.5MG/ML)
1.3000 mg/m2 | Freq: Once | INTRAMUSCULAR | Status: AC
  Administered 2023-02-05: 2.25 mg via SUBCUTANEOUS
  Filled 2023-02-05: qty 0.9

## 2023-02-05 NOTE — Telephone Encounter (Signed)
Reached out to schedule per WQ left voicemail.

## 2023-02-05 NOTE — Progress Notes (Signed)
[facility] [phone]   [phone]  PROGRESS NOTE  Patient Care Team: [facility], [facility] as PCP - General (Rheumatology)  Hematological/Oncological History # IgG Lambda Multiple Myeloma  06/14/2022: establish care with [name] due to anemia. Labs showed M protein 4.9, Kappa 7.2, Lambda 10.8, ratio 0.67 07/13/2022: Bmbx showed Lambda restricted plasma cell neoplasm involving approximately 60% of the cellular marrow by IHC on the biopsy.  08/01/2022: Cycle 1 Day 1 of VRd chemotherapy. (Holding revlimid initially) 08/21/2022:  Cycle 2 Day 1 of VRd chemotherapy. (Holding revlimid) 09/04/2022: Cycle 3 Day 1 of VRd chemotherapy. Started revlimid 10/03/2022: Cycle 4 Day 1 of VRd chemotherapy 10/31/2022: Cycle 5 Day 1 of VRd chemotherapy 11/20/2022: Cycle 6 Day 1 of VRd chemotherapy 12/11/2022: Cycle 7 Day 1 of VRd chemotherapy 01/02/2023: Cycle 8 Day 1 of VRd chemotherapy 01/22/2023: Cycle 9 Day 1 of VRd chemotherapy  Interval History:  Zoe [name] 89 y.o. female with medical history significant for newly diagnosed multiple myeloma who presents for a follow up visit. The patient's last visit was on 01/22/2023. In the interim since the last visit she has continued VRd chemotherapy.   On exam today Zoe [name] reports that the rash on her chest and abdomen are improving but she is not applying the ointment to her back and she "feels like it is reddish in uncomfortable".  She reports that the steroid cream is working where she is using it.  She notes that she continues to have pain in her left leg.  She is wearing her compression socks and taking 1 Lasix pill per day.  She has been in contact with her cardiologist regarding management of her blood pressure medications.  She notes she is not having any further episodes of lightheadedness, dizziness, or shortness of breath.  She notes that the leg pain can continue to interfere with her sleep.   Otherwise she is tolerating her treatment well and is willing able to proceed at this time.  She denies fevers, chills, sweats, shortness of breath, chest pain or cough. She has no other complaints.  Full 10 point ROS was otherwise negative.  MEDICAL HISTORY:  Past Medical History:  Diagnosis Date   Arthritis    some - per patient   Breast cancer (HCC)    breast cancer / left    Cataract    bilat    GERD (gastroesophageal reflux disease)    History of kidney stones    Hyperlipidemia    Hypertension    Hypothyroidism    Macular degeneration    Left   S/P TAVR (transcatheter aortic valve replacement) 09/03/2018   23 mm Edwards Sapien 3 transcatheter heart valve placed via percutaneous right transfemoral approach    Severe aortic stenosis    Stress incontinence    Thyroid disease    Tinnitus     SURGICAL HISTORY: Past Surgical History:  Procedure Laterality Date   ABDOMINAL HYSTERECTOMY  1970's   BACK SURGERY     BREAST LUMPECTOMY  12/1998   lumpectomy   CARDIAC CATHETERIZATION     EYE SURGERY     cataract surgery bilat    INTRAOPERATIVE TRANSTHORACIC ECHOCARDIOGRAM N/A 09/03/2018   Procedure: INTRAOPERATIVE TRANSTHORACIC ECHOCARDIOGRAM;  Surgeon: Kathleene Hazel, MD;  Location: [facility] OR;  Service: Open Heart Surgery;  Laterality: N/A;   KYPHOPLASTY N/A 09/07/2022   Procedure: THORACIC EIGHT KYPHOPLASTY;  Surgeon: Venita Lick, MD;  Location: [facility] OR;  Service: Orthopedics;  Laterality: N/A;  1  hr Local with [facility] 3 C-Bed   LITHOTRIPSY     Right total knee     2018 Dr. Charlann Boxer   RIGHT/LEFT HEART CATH AND CORONARY ANGIOGRAPHY N/A 08/06/2018   Procedure: RIGHT/LEFT HEART CATH AND CORONARY ANGIOGRAPHY;  Surgeon: Yates Decamp, MD;  Location: [facility] INVASIVE CV LAB;  Service: Cardiovascular;  Laterality: N/A;   THYROIDECTOMY, PARTIAL  1975   TONSILLECTOMY     as a child - patient not sure of exact date   TOTAL KNEE ARTHROPLASTY Left 03/13/2016   Procedure: TOTAL KNEE  ARTHROPLASTY;  Surgeon: Durene Romans, MD;  Location: [facility] ORS;  Service: Orthopedics;  Laterality: Left;   TOTAL KNEE ARTHROPLASTY Right 06/18/2017   Procedure: RIGHT TOTAL KNEE ARTHROPLASTY;  Surgeon: Durene Romans, MD;  Location: [facility] ORS;  Service: Orthopedics;  Laterality: Right;   TRANSCATHETER AORTIC VALVE REPLACEMENT, TRANSFEMORAL N/A 09/03/2018   Procedure: TRANSCATHETER AORTIC VALVE REPLACEMENT, TRANSFEMORAL;  Surgeon: Kathleene Hazel, MD;  Location: [facility] OR;  Service: Open Heart Surgery;  Laterality: N/A;    SOCIAL HISTORY: Social History   Socioeconomic History   Marital status: Widowed    Spouse name: Not on file   Number of children: 4   Years of education: Not on file   Highest education level: Master's degree (e.g., MA, MS, MEng, MEd, MSW, MBA)  Occupational History   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  Tobacco Use   Smoking status: Never   Smokeless tobacco: Never  Vaping Use   Vaping Use: Never used  Substance and Sexual Activity   Alcohol use: No   Drug use: No   Sexual activity: Not Currently  Other Topics Concern   Not on file  Social History Narrative   Not on file   Social Determinants of Health   Financial Resource Strain: Low Risk  (11/12/2018)   Overall Financial Resource Strain (CARDIA)    Difficulty of Paying Living Expenses: Not very hard  Food Insecurity: No Food Insecurity (11/12/2018)   Hunger Vital Sign    Worried About Running Out of Food in the Last Year: Never true    Ran Out of Food in the Last Year: Never true  Transportation Needs: No Transportation Needs (11/12/2018)   PRAPARE - Administrator, Civil Service (Medical): No    Lack of Transportation (Non-Medical): No  Physical Activity: Inactive (11/12/2018)   Exercise Vital Sign    Days of Exercise per Week: 0 days    Minutes of Exercise per Session: 0 min  Stress: Stress Concern Present (11/12/2018)   [employer] of Occupational  Health - Occupational Stress Questionnaire    Feeling of Stress : To some extent  Social Connections: Not on file  Intimate Partner Violence: Not on file    FAMILY HISTORY: Family History  Problem Relation Age of Onset   Diabetes Mother    Stroke Mother        Carotid artery disease   Heart disease Father        CAD   Coronary artery disease Father    Diabetes Sister     ALLERGIES:  is allergic to penicillins and sulfa antibiotics.  MEDICATIONS:  Current Outpatient Medications  Medication Sig Dispense Refill   acyclovir (ZOVIRAX) 400 MG tablet Take 400 mg by mouth 2 (two) times daily. Pt takes 1 tablet (400 mg total) by mouth 2 (two) times daily.     Artificial Tear Solution (SOOTHE XP) SOLN Place 1 drop into both eyes every  evening.     aspirin EC 81 MG tablet Take 81 mg by mouth at bedtime. Swallow whole.     calcitonin, salmon, (MIACALCIN/FORTICAL) 200 UNIT/ACT nasal spray Place 1 spray into alternate nostrils daily.     carvedilol (COREG) 12.5 MG tablet Take 12.5 mg by mouth 2 (two) times daily with a meal.     Cholecalciferol (VITAMIN D3) 50 MCG (2000 UT) capsule Take 1 capsule (2,000 Units total) by mouth daily.     cyanocobalamin (VITAMIN B12) 1000 MCG tablet Take 1,000 mcg by mouth daily.     dapagliflozin propanediol (FARXIGA) 10 MG TABS tablet TAKE 1 TABLET BY MOUTH EVERY DAY 90 tablet 2   dexamethasone (DECADRON) 4 MG tablet Take 5 tablets (20 mg total) by mouth once a week. Take 20 mg (5 tablets) on day of multiple myeloma treatment 20 tablet 5   esomeprazole (NEXIUM) 20 MG capsule Take 20 mg by mouth daily as needed (Heartburn).     ferrous sulfate 325 (65 FE) MG tablet Take 325 mg by mouth daily with breakfast. (Patient not taking: Reported on 01/22/2023)     furosemide (LASIX) 40 MG tablet Take 1 tablet (40 mg total) by mouth daily. 90 tablet 1   gabapentin (NEURONTIN) 300 MG capsule Take 3 capsules (900 mg total) by mouth 2 (two) times daily. 180 capsule 1    lenalidomide (REVLIMID) 25 MG capsule Take 1 capsule (25 mg total) by mouth daily. Take for 14 days,then none for 7 days.Repeat every 21 days. 14 capsule 0   levothyroxine (SYNTHROID, LEVOTHROID) 100 MCG tablet Take 100 mcg by mouth daily before breakfast.  2   losartan (COZAAR) 25 MG tablet TAKE 1 TABLET (25 MG TOTAL) BY MOUTH DAILY. 90 tablet 1   methylPREDNISolone (MEDROL DOSEPAK) 4 MG TBPK tablet Take 6 pills by mouth day 1, 5 on day 2, 4 on day 3, 3 on day 4, 2 on day 5, 1 on day 6 21 tablet 0   ondansetron (ZOFRAN) 8 MG tablet Take 1 tablet (8 mg total) by mouth every 8 (eight) hours as needed. 30 tablet 0   polyethylene glycol (MIRALAX / GLYCOLAX) 17 g packet Take 17 g by mouth daily as needed for mild constipation.     potassium chloride (KLOR-CON M10) 10 MEQ tablet Take 2 tablets (20 mEq total) by mouth 2 (two) times daily. 180 tablet 1   pravastatin (PRAVACHOL) 40 MG tablet Take 40 mg by mouth every evening.     prochlorperazine (COMPAZINE) 10 MG tablet Take 1 tablet (10 mg total) by mouth every 6 (six) hours as needed for nausea or vomiting. 30 tablet 0   triamcinolone ointment (KENALOG) 0.5 % Apply 1 Application topically 2 (two) times daily. 30 g 0   No current facility-administered medications for this visit.    REVIEW OF SYSTEMS:   Constitutional: ( - ) fevers, ( - )  chills , ( - ) night sweats Eyes: ( - ) blurriness of vision, ( - ) double vision, ( - ) watery eyes Ears, nose, mouth, throat, and face: ( - ) mucositis, ( - ) sore throat Respiratory: ( - ) cough, ( - ) dyspnea, ( - ) wheezes Cardiovascular: ( - ) palpitation, ( - ) chest discomfort, ( +) lower extremity swelling Gastrointestinal:  ( - ) nausea, ( - ) heartburn, ( - ) change in bowel habits Skin: ( - ) abnormal skin rashes Lymphatics: ( - ) new lymphadenopathy, ( - ) easy bruising  Neurological: ( - ) numbness, ( - ) tingling, ( - ) new weaknesses Behavioral/Psych: ( - ) mood change, ( - ) new changes  All other  systems were reviewed with the patient and are negative.  PHYSICAL EXAMINATION: ECOG PERFORMANCE STATUS: 1 - Symptomatic but completely ambulatory  There were no vitals filed for this visit.    There were no vitals filed for this visit.    GENERAL: well appearing elderly Caucasian female, alert, no distress and comfortable SKIN: skin color, texture, turgor are normal, no rashes or significant lesions EYES: conjunctiva are pink and non-injected, sclera clear LUNGS: clear to auscultation and percussion with normal breathing effort HEART: regular rate & rhythm and no murmurs. +1 bilateral lower extremity edema starting below the knee to ankles.  Musculoskeletal: no cyanosis of digits and no clubbing  PSYCH: alert & oriented x 3, fluent speech NEURO: no focal motor/sensory deficits  LABORATORY DATA:  I have reviewed the data as listed    Latest Ref Rng & Units 01/29/2023    8:27 AM 01/22/2023    9:32 AM 01/17/2023   10:15 AM  CBC  WBC 4.0 - 10.5 K/uL 6.5  8.4  7.6   Hemoglobin 12.0 - 15.0 g/dL 16.1  09.6  04.5   Hematocrit 36.0 - 46.0 % 35.8  41.7  40.4   Platelets 150 - 400 K/uL 181  97  125        Latest Ref Rng & Units 01/29/2023    8:27 AM 01/22/2023    9:32 AM 01/17/2023   10:15 AM  CMP  Glucose 70 - 99 mg/dL 409  811  87   BUN 8 - 23 mg/dL 23  37  46   Creatinine 0.44 - 1.00 mg/dL 9.14  7.82  9.56   Sodium 135 - 145 mmol/L 135  132  131   Potassium 3.5 - 5.1 mmol/L 3.8  4.5  4.7   Chloride 98 - 111 mmol/L 103  98  98   CO2 22 - 32 mmol/L Calcium 8.9 - 10.3 mg/dL 8.5  8.8  8.8   Total Protein 6.5 - 8.1 g/dL 5.4  5.8  6.2   Total Bilirubin 0.3 - 1.2 mg/dL 0.5  0.9  0.5   Alkaline Phos 38 - 126 U/L 55  55  53   AST 15 - 41 U/L ALT 0 - 44 U/L Lab Results  Component Value Date   MPROTEIN 0.7 (H) 12/26/2022   MPROTEIN 1.0 (H) 11/28/2022   MPROTEIN 1.3 (H) 10/31/2022   Lab Results  Component Value Date   KPAFRELGTCHN 12.6  12/26/2022   KPAFRELGTCHN 10.3 11/28/2022   KPAFRELGTCHN 11.9 10/31/2022   LAMBDASER 9.5 12/26/2022   LAMBDASER 5.9 11/28/2022   LAMBDASER 7.9 10/31/2022   KAPLAMBRATIO 1.33 12/26/2022   KAPLAMBRATIO 1.75 (H) 11/28/2022   KAPLAMBRATIO 1.51 10/31/2022    RADIOGRAPHIC STUDIES: No results found.  ASSESSMENT & PLAN [name] 89 y.o. female with medical history significant for multiple myeloma who presents for a follow up visit.   After review of the labs, review the records, discussion with the patient the findings are most consistent with an IgG lambda multiple myeloma.  The patient meets the diagnostic criteria based on 60% plasma cells in the bone marrow and anemia.  Additionally the patient was noted to have vitamin B12 deficiency which  is currently being treated as well.  Given her degree of anemia would recommend proceeding with VD chemotherapy and will add Revlimid once hemoglobin improves.  The patient and her daughter voiced understanding of the plan moving forward.  # IgG Lambda Multiple Myeloma  --diagnosis of MM confirmed with bone marrow biopsy showing 60% plasma cells and anemia -- Bone survey shows no lytic lesions, kidney function is within normal limits.  --08/01/2022 was Cycle 1 Day 1 of VRd  Plan:  --Today is Cycle 9 Day 15 of VRd chemotherapy.  --Labs show white blood cell count 4.5, hemoglobin 11.9, MCV 87.8, and platelets of 168 --last M protein from 12/26/2022 trended down to 0.7 (4.9 prior to start of therapy). Normalized SFLC.  --at each visit will collect CBC, CMP, and LDH with monthly restaging labs SPEP and SFLC.  --RTC in 2 weeks with interval weekly treatment.   # Rash -- Potentially a side effect of the Revlimid therapy. -- Rash is not painful or itchy. -- Prescribed triamcinolone ointment today to see if this helps with her rash.  #Hypotension -- BP 92/53, remains low.  --Patient is symptomatic with some lightheadedness and blurry vision. --  Recommend holding her Lasix for the next 3 days.  We provided her with 500 cc of fluid today in order to try to help bolster her blood pressure -- Recommend she continue her carvedilol therapy but hold losartan as well. -- Encouraged her to check her blood pressure levels at home.  #Bilateral lower extremity edema: --Currently taking Lasix 20 mg BID. Advised to follow up with cardiologist, Dr. Jacinto Halim regarding dosing.  --Patient is currently taking potassium chloride 10 mEq twice daily.  --Continue to wear compression stockings.   # Normocytic Anemia #Vitamin B12 Deficiency --Anemia likely driven by multiple myeloma, but may be component of vitamin B12 deficiency as well. --initial labs showed elevated MMA with B12 180 -- Continue vitamin B12 1000 mcg p.o. daily -- Continue to monitor  #Left leg/thigh neuropathy: --Currently on gabapentin 900 mg nightly and 600 mg in the morning --Encouraged to try water aerobics and stretches.  --Following with Dr. Debbe Bales (ortho) to see if she is a candidate for steroid injections.  -- Patient evaluated by Dr. Barbaraann Cao thinks that this may be neuropathy worsened by her Velcade  #Supportive Care -- chemotherapy education complete -- port placement not required.  --Awaiting to start Zometa/Xgeva  -- zofran 8mg  q8H PRN and compazine 10mg  PO q6H for nausea -- acyclovir 400mg  PO BID for VCZ prophylaxis -- tylenol 1000 mg q8H PRN for back pain.   No orders of the defined types were placed in this encounter.   All questions were answered. The patient knows to call the clinic with any problems, questions or concerns.  A total of more than 30 minutes were spent on this encounter with face-to-face time and non-face-to-face time, including preparing to see the patient, ordering tests and/or medications, counseling the patient and coordination of care as outlined above.   Ulysees Barns, MD Department of Hematology/Oncology [facility] Phone: [phone] Pager: [phone] Email: [email or web address]   02/05/2023 6:20 AM

## 2023-02-05 NOTE — Patient Instructions (Signed)
[facility]  Discharge Instructions: Thank you for choosing [facility] to provide your oncology and hematology care.   If you have a lab appointment with the Cancer Center, please go directly to the Cancer Center and check in at the registration area.   Wear comfortable clothing and clothing appropriate for easy access to any Portacath or PICC line.   We strive to give you quality time with your provider. You may need to reschedule your appointment if you arrive late (15 or more minutes).  Arriving late affects you and other patients whose appointments are after yours.  Also, if you miss three or more appointments without notifying the office, you may be dismissed from the clinic at the provider's discretion.      For prescription refill requests, have your pharmacy contact our office and allow 72 hours for refills to be completed.    Today you received the following chemotherapy and/or immunotherapy agents: Velcade     To help prevent nausea and vomiting after your treatment, we encourage you to take your nausea medication as directed.  BELOW ARE SYMPTOMS THAT SHOULD BE REPORTED IMMEDIATELY: *FEVER GREATER THAN 100.4 F (38 C) OR HIGHER *CHILLS OR SWEATING *NAUSEA AND VOMITING THAT IS NOT CONTROLLED WITH YOUR NAUSEA MEDICATION *UNUSUAL SHORTNESS OF BREATH *UNUSUAL BRUISING OR BLEEDING *URINARY PROBLEMS (pain or burning when urinating, or frequent urination) *BOWEL PROBLEMS (unusual diarrhea, constipation, pain near the anus) TENDERNESS IN MOUTH AND THROAT WITH OR WITHOUT PRESENCE OF ULCERS (sore throat, sores in mouth, or a toothache) UNUSUAL RASH, SWELLING OR PAIN  UNUSUAL VAGINAL DISCHARGE OR ITCHING   Items with * indicate a potential emergency and should be followed up as soon as possible or go to the Emergency Department if any problems should occur.  Please show the CHEMOTHERAPY ALERT CARD or IMMUNOTHERAPY ALERT CARD at check-in  to the Emergency Department and triage nurse.  Should you have questions after your visit or need to cancel or reschedule your appointment, please contact [facility]  Dept: [phone]  and follow the prompts.  Office hours are 8:00 a.m. to 4:30 p.m. Monday - Friday. Please note that voicemails left after 4:00 p.m. may not be returned until the following business day.  We are closed weekends and major holidays. You have access to a nurse at all times for urgent questions. Please call the main number to the clinic Dept: [phone] and follow the prompts.   For any non-urgent questions, you may also contact your provider using MyChart. We now offer e-Visits for anyone 18 and older to request care online for non-urgent symptoms. For details visit [email or web address].   Also download the MyChart app! Go to the app store, search "MyChart", open the app, select [facility], and log in with your MyChart username and password.   

## 2023-02-06 LAB — KAPPA/LAMBDA LIGHT CHAINS
Kappa free light chain: 14.9 mg/L (ref 3.3–19.4)
Kappa, lambda light chain ratio: 1.34 (ref 0.26–1.65)
Lambda free light chains: 11.1 mg/L (ref 5.7–26.3)

## 2023-02-07 ENCOUNTER — Encounter (INDEPENDENT_AMBULATORY_CARE_PROVIDER_SITE_OTHER): Payer: Medicare Other | Admitting: Ophthalmology

## 2023-02-07 DIAGNOSIS — H43813 Vitreous degeneration, bilateral: Secondary | ICD-10-CM | POA: Diagnosis not present

## 2023-02-07 DIAGNOSIS — I10 Essential (primary) hypertension: Secondary | ICD-10-CM

## 2023-02-07 DIAGNOSIS — H35033 Hypertensive retinopathy, bilateral: Secondary | ICD-10-CM

## 2023-02-07 DIAGNOSIS — H353231 Exudative age-related macular degeneration, bilateral, with active choroidal neovascularization: Secondary | ICD-10-CM

## 2023-02-08 ENCOUNTER — Other Ambulatory Visit: Payer: Self-pay | Admitting: *Deleted

## 2023-02-08 DIAGNOSIS — R2689 Other abnormalities of gait and mobility: Secondary | ICD-10-CM | POA: Diagnosis not present

## 2023-02-08 DIAGNOSIS — M6281 Muscle weakness (generalized): Secondary | ICD-10-CM | POA: Diagnosis not present

## 2023-02-08 DIAGNOSIS — M546 Pain in thoracic spine: Secondary | ICD-10-CM | POA: Diagnosis not present

## 2023-02-08 DIAGNOSIS — M545 Low back pain, unspecified: Secondary | ICD-10-CM | POA: Diagnosis not present

## 2023-02-08 DIAGNOSIS — C9000 Multiple myeloma not having achieved remission: Secondary | ICD-10-CM

## 2023-02-08 MED ORDER — LENALIDOMIDE 25 MG PO CAPS
25.0000 mg | ORAL_CAPSULE | Freq: Every day | ORAL | 0 refills | Status: DC
Start: 2023-02-08 — End: 2023-02-28

## 2023-02-09 LAB — MULTIPLE MYELOMA PANEL, SERUM
Albumin SerPl Elph-Mcnc: 2.6 g/dL — ABNORMAL LOW (ref 2.9–4.4)
Albumin/Glob SerPl: 1.2 (ref 0.7–1.7)
Alpha 1: 0.3 g/dL (ref 0.0–0.4)
Alpha2 Glob SerPl Elph-Mcnc: 0.7 g/dL (ref 0.4–1.0)
B-Globulin SerPl Elph-Mcnc: 0.6 g/dL — ABNORMAL LOW (ref 0.7–1.3)
Gamma Glob SerPl Elph-Mcnc: 0.6 g/dL (ref 0.4–1.8)
Globulin, Total: 2.2 g/dL (ref 2.2–3.9)
IgA: 53 mg/dL — ABNORMAL LOW (ref 64–422)
IgG (Immunoglobin G), Serum: 640 mg/dL (ref 586–1602)
IgM (Immunoglobulin M), Srm: 26 mg/dL (ref 26–217)
M Protein SerPl Elph-Mcnc: 0.3 g/dL — ABNORMAL HIGH
Total Protein ELP: 4.8 g/dL — ABNORMAL LOW (ref 6.0–8.5)

## 2023-02-12 ENCOUNTER — Inpatient Hospital Stay: Payer: Medicare Other

## 2023-02-12 ENCOUNTER — Telehealth: Payer: Self-pay

## 2023-02-12 ENCOUNTER — Other Ambulatory Visit: Payer: Self-pay

## 2023-02-12 VITALS — BP 149/62 | HR 78 | Temp 97.6°F | Resp 17 | Wt 162.8 lb

## 2023-02-12 DIAGNOSIS — E538 Deficiency of other specified B group vitamins: Secondary | ICD-10-CM | POA: Diagnosis not present

## 2023-02-12 DIAGNOSIS — D649 Anemia, unspecified: Secondary | ICD-10-CM | POA: Diagnosis not present

## 2023-02-12 DIAGNOSIS — C9000 Multiple myeloma not having achieved remission: Secondary | ICD-10-CM

## 2023-02-12 DIAGNOSIS — Z5112 Encounter for antineoplastic immunotherapy: Secondary | ICD-10-CM | POA: Diagnosis not present

## 2023-02-12 DIAGNOSIS — I959 Hypotension, unspecified: Secondary | ICD-10-CM | POA: Diagnosis not present

## 2023-02-12 DIAGNOSIS — R21 Rash and other nonspecific skin eruption: Secondary | ICD-10-CM | POA: Diagnosis not present

## 2023-02-12 LAB — CBC WITH DIFFERENTIAL (CANCER CENTER ONLY)
Abs Immature Granulocytes: 0.04 10*3/uL (ref 0.00–0.07)
Basophils Absolute: 0.1 10*3/uL (ref 0.0–0.1)
Basophils Relative: 1 %
Eosinophils Absolute: 0.2 10*3/uL (ref 0.0–0.5)
Eosinophils Relative: 2 %
HCT: 35.3 % — ABNORMAL LOW (ref 36.0–46.0)
Hemoglobin: 12 g/dL (ref 12.0–15.0)
Immature Granulocytes: 1 %
Lymphocytes Relative: 5 %
Lymphs Abs: 0.4 10*3/uL — ABNORMAL LOW (ref 0.7–4.0)
MCH: 29.4 pg (ref 26.0–34.0)
MCHC: 34 g/dL (ref 30.0–36.0)
MCV: 86.5 fL (ref 80.0–100.0)
Monocytes Absolute: 1.1 10*3/uL — ABNORMAL HIGH (ref 0.1–1.0)
Monocytes Relative: 14 %
Neutro Abs: 6 10*3/uL (ref 1.7–7.7)
Neutrophils Relative %: 77 %
Platelet Count: 147 10*3/uL — ABNORMAL LOW (ref 150–400)
RBC: 4.08 MIL/uL (ref 3.87–5.11)
RDW: 17 % — ABNORMAL HIGH (ref 11.5–15.5)
WBC Count: 7.8 10*3/uL (ref 4.0–10.5)
nRBC: 0 % (ref 0.0–0.2)

## 2023-02-12 LAB — CMP (CANCER CENTER ONLY)
ALT: 13 U/L (ref 0–44)
AST: 13 U/L — ABNORMAL LOW (ref 15–41)
Albumin: 3.4 g/dL — ABNORMAL LOW (ref 3.5–5.0)
Alkaline Phosphatase: 63 U/L (ref 38–126)
Anion gap: 8 (ref 5–15)
BUN: 13 mg/dL (ref 8–23)
CO2: 30 mmol/L (ref 22–32)
Calcium: 8.5 mg/dL — ABNORMAL LOW (ref 8.9–10.3)
Chloride: 96 mmol/L — ABNORMAL LOW (ref 98–111)
Creatinine: 0.62 mg/dL (ref 0.44–1.00)
GFR, Estimated: >60 mL/min (ref >60)
Glucose, Bld: 127 mg/dL — ABNORMAL HIGH (ref 70–99)
Potassium: 3 mmol/L — ABNORMAL LOW (ref 3.5–5.1)
Sodium: 134 mmol/L — ABNORMAL LOW (ref 135–145)
Total Bilirubin: 1.4 mg/dL — ABNORMAL HIGH (ref 0.3–1.2)
Total Protein: 5.8 g/dL — ABNORMAL LOW (ref 6.5–8.1)

## 2023-02-12 MED ORDER — BORTEZOMIB CHEMO SQ INJECTION 3.5 MG (2.5MG/ML)
1.3000 mg/m2 | Freq: Once | INTRAMUSCULAR | Status: DC
  Filled 2023-02-12: qty 0.9

## 2023-02-12 NOTE — Progress Notes (Signed)
Pt reporting rash on abdomen and backside, MD aware, ok to treat.

## 2023-02-12 NOTE — Telephone Encounter (Signed)
Patient is wanting to let you know she is going to start back on her potassium supplement.This was at the request of her Oncologist One of our on call nurses over the weekend also asked her to discontinue Carvedilol and start back on Lasix once a day. She just wanted to give you this info to make sure you agree with this plan.

## 2023-02-12 NOTE — Telephone Encounter (Signed)
Yes she should also be in potassium supplements

## 2023-02-12 NOTE — Progress Notes (Signed)
Pt seen in infusion by this nurse.  Pt has been c/o generalized faint , flat pink rash-worse across chest, abdomen and back. Pt states it is very itchy and is keeping her awake at night.  Appears to be a drug-related rash. Discussed with Dr. Leonides Schanz. Likely related to her Velcade. Therefore per Dr. Leonides Schanz, Velcade held today. Dr. Leonides Schanz to change pt's treatment plan to Darzalex. Instructed to try oatmeal based lotion for itching and po benadryl at night for the itching as needed.  Aloos pt's K+ is 3.0. Pt had stopped her oral potassium over the weekend-unclear as to why. Advised to resume current KCL prescription 20 meq BID. PPt and her daughter voiced understanding to all of the above

## 2023-02-13 ENCOUNTER — Other Ambulatory Visit: Payer: Self-pay | Admitting: Hematology and Oncology

## 2023-02-13 ENCOUNTER — Telehealth: Payer: Self-pay | Admitting: *Deleted

## 2023-02-13 MED ORDER — HYDROCORTISONE 1 % EX CREA: 1.0000 | TOPICAL_CREAM | Freq: Two times a day (BID) | CUTANEOUS | 0 refills | Status: DC

## 2023-02-13 NOTE — Telephone Encounter (Signed)
PC from patient, she states her rash which was assessed yesterday in infusion is worse - it starts at her ankles & goes up to her abdomen & is itching.  She is taking benadryl at night & has tried kenalog cream but it is not helping.  She is requesting a different cream.  Dr Leonides Schanz informed, states he will prescribe a steroid cream for her.  Patient informed, she verbalizes understanding.

## 2023-02-14 ENCOUNTER — Telehealth: Payer: Self-pay | Admitting: *Deleted

## 2023-02-14 NOTE — Telephone Encounter (Signed)
Received vm message from pt's daughter-in-law [name]. Requesting a call back. No answer but was able to lv vm message for her to return this call at her convenience [phone].

## 2023-02-15 ENCOUNTER — Other Ambulatory Visit: Payer: Self-pay | Admitting: Physician Assistant

## 2023-02-17 ENCOUNTER — Other Ambulatory Visit: Payer: Self-pay

## 2023-02-17 ENCOUNTER — Encounter (HOSPITAL_COMMUNITY): Payer: Self-pay

## 2023-02-17 ENCOUNTER — Emergency Department (HOSPITAL_COMMUNITY): Payer: Medicare Other

## 2023-02-17 ENCOUNTER — Inpatient Hospital Stay (HOSPITAL_COMMUNITY)
Admission: EM | Admit: 2023-02-17 | Discharge: 2023-02-19 | DRG: 309 | Disposition: A | Discharge status: Home / Self Care | Payer: Medicare Other | Attending: Internal Medicine | Admitting: Internal Medicine

## 2023-02-17 DIAGNOSIS — Z953 Presence of xenogenic heart valve: Secondary | ICD-10-CM

## 2023-02-17 DIAGNOSIS — E785 Hyperlipidemia, unspecified: Secondary | ICD-10-CM | POA: Diagnosis not present

## 2023-02-17 DIAGNOSIS — E890 Postprocedural hypothyroidism: Secondary | ICD-10-CM | POA: Diagnosis present

## 2023-02-17 DIAGNOSIS — C9000 Multiple myeloma not having achieved remission: Secondary | ICD-10-CM | POA: Diagnosis not present

## 2023-02-17 DIAGNOSIS — Z853 Personal history of malignant neoplasm of breast: Secondary | ICD-10-CM

## 2023-02-17 DIAGNOSIS — Z743 Need for continuous supervision: Secondary | ICD-10-CM | POA: Diagnosis not present

## 2023-02-17 DIAGNOSIS — H3530 Unspecified macular degeneration: Secondary | ICD-10-CM | POA: Diagnosis not present

## 2023-02-17 DIAGNOSIS — E871 Hypo-osmolality and hyponatremia: Secondary | ICD-10-CM | POA: Diagnosis present

## 2023-02-17 DIAGNOSIS — I350 Nonrheumatic aortic (valve) stenosis: Secondary | ICD-10-CM | POA: Diagnosis present

## 2023-02-17 DIAGNOSIS — T451X5A Adverse effect of antineoplastic and immunosuppressive drugs, initial encounter: Secondary | ICD-10-CM

## 2023-02-17 DIAGNOSIS — Z952 Presence of prosthetic heart valve: Secondary | ICD-10-CM

## 2023-02-17 DIAGNOSIS — Z90710 Acquired absence of both cervix and uterus: Secondary | ICD-10-CM

## 2023-02-17 DIAGNOSIS — Z880 Allergy status to penicillin: Secondary | ICD-10-CM

## 2023-02-17 DIAGNOSIS — R079 Chest pain, unspecified: Secondary | ICD-10-CM

## 2023-02-17 DIAGNOSIS — I6523 Occlusion and stenosis of bilateral carotid arteries: Secondary | ICD-10-CM | POA: Diagnosis present

## 2023-02-17 DIAGNOSIS — I5032 Chronic diastolic (congestive) heart failure: Secondary | ICD-10-CM | POA: Diagnosis not present

## 2023-02-17 DIAGNOSIS — D509 Iron deficiency anemia, unspecified: Secondary | ICD-10-CM | POA: Diagnosis not present

## 2023-02-17 DIAGNOSIS — I480 Paroxysmal atrial fibrillation: Principal | ICD-10-CM | POA: Diagnosis present

## 2023-02-17 DIAGNOSIS — Z8249 Family history of ischemic heart disease and other diseases of the circulatory system: Secondary | ICD-10-CM

## 2023-02-17 DIAGNOSIS — I110 Hypertensive heart disease with heart failure: Secondary | ICD-10-CM | POA: Diagnosis present

## 2023-02-17 DIAGNOSIS — R739 Hyperglycemia, unspecified: Secondary | ICD-10-CM | POA: Diagnosis not present

## 2023-02-17 DIAGNOSIS — I959 Hypotension, unspecified: Secondary | ICD-10-CM | POA: Diagnosis not present

## 2023-02-17 DIAGNOSIS — Z7982 Long term (current) use of aspirin: Secondary | ICD-10-CM

## 2023-02-17 DIAGNOSIS — F419 Anxiety disorder, unspecified: Secondary | ICD-10-CM | POA: Diagnosis present

## 2023-02-17 DIAGNOSIS — E876 Hypokalemia: Secondary | ICD-10-CM | POA: Diagnosis not present

## 2023-02-17 DIAGNOSIS — E039 Hypothyroidism, unspecified: Secondary | ICD-10-CM | POA: Diagnosis present

## 2023-02-17 DIAGNOSIS — Z96653 Presence of artificial knee joint, bilateral: Secondary | ICD-10-CM | POA: Diagnosis present

## 2023-02-17 DIAGNOSIS — Z79890 Hormone replacement therapy (postmenopausal): Secondary | ICD-10-CM

## 2023-02-17 DIAGNOSIS — K3189 Other diseases of stomach and duodenum: Secondary | ICD-10-CM | POA: Diagnosis not present

## 2023-02-17 DIAGNOSIS — I10 Essential (primary) hypertension: Secondary | ICD-10-CM | POA: Diagnosis present

## 2023-02-17 DIAGNOSIS — I2489 Other forms of acute ischemic heart disease: Secondary | ICD-10-CM | POA: Diagnosis not present

## 2023-02-17 DIAGNOSIS — Z823 Family history of stroke: Secondary | ICD-10-CM | POA: Diagnosis not present

## 2023-02-17 DIAGNOSIS — E7800 Pure hypercholesterolemia, unspecified: Secondary | ICD-10-CM | POA: Diagnosis not present

## 2023-02-17 DIAGNOSIS — R109 Unspecified abdominal pain: Secondary | ICD-10-CM | POA: Diagnosis not present

## 2023-02-17 DIAGNOSIS — I4891 Unspecified atrial fibrillation: Secondary | ICD-10-CM | POA: Diagnosis not present

## 2023-02-17 DIAGNOSIS — Z87442 Personal history of urinary calculi: Secondary | ICD-10-CM

## 2023-02-17 DIAGNOSIS — N393 Stress incontinence (female) (male): Secondary | ICD-10-CM | POA: Diagnosis present

## 2023-02-17 DIAGNOSIS — G620 Drug-induced polyneuropathy: Secondary | ICD-10-CM | POA: Diagnosis not present

## 2023-02-17 DIAGNOSIS — Z7984 Long term (current) use of oral hypoglycemic drugs: Secondary | ICD-10-CM

## 2023-02-17 DIAGNOSIS — I499 Cardiac arrhythmia, unspecified: Secondary | ICD-10-CM | POA: Diagnosis not present

## 2023-02-17 DIAGNOSIS — Z833 Family history of diabetes mellitus: Secondary | ICD-10-CM

## 2023-02-17 DIAGNOSIS — R911 Solitary pulmonary nodule: Secondary | ICD-10-CM | POA: Diagnosis present

## 2023-02-17 DIAGNOSIS — K219 Gastro-esophageal reflux disease without esophagitis: Secondary | ICD-10-CM | POA: Diagnosis present

## 2023-02-17 DIAGNOSIS — Z882 Allergy status to sulfonamides status: Secondary | ICD-10-CM

## 2023-02-17 DIAGNOSIS — Z79899 Other long term (current) drug therapy: Secondary | ICD-10-CM

## 2023-02-17 DIAGNOSIS — R0789 Other chest pain: Secondary | ICD-10-CM | POA: Diagnosis not present

## 2023-02-17 DIAGNOSIS — R6889 Other general symptoms and signs: Secondary | ICD-10-CM | POA: Diagnosis not present

## 2023-02-17 LAB — CBC WITH DIFFERENTIAL/PLATELET
Abs Immature Granulocytes: 0.02 10*3/uL (ref 0.00–0.07)
Basophils Absolute: 0.1 10*3/uL (ref 0.0–0.1)
Basophils Relative: 1 %
Eosinophils Absolute: 0.2 10*3/uL (ref 0.0–0.5)
Eosinophils Relative: 2 %
HCT: 35.3 % — ABNORMAL LOW (ref 36.0–46.0)
Hemoglobin: 12.1 g/dL (ref 12.0–15.0)
Immature Granulocytes: 0 %
Lymphocytes Relative: 8 %
Lymphs Abs: 0.7 10*3/uL (ref 0.7–4.0)
MCH: 30 pg (ref 26.0–34.0)
MCHC: 34.3 g/dL (ref 30.0–36.0)
MCV: 87.4 fL (ref 80.0–100.0)
Monocytes Absolute: 1.5 10*3/uL — ABNORMAL HIGH (ref 0.1–1.0)
Monocytes Relative: 17 %
Neutro Abs: 6.4 10*3/uL (ref 1.7–7.7)
Neutrophils Relative %: 72 %
Platelets: 278 10*3/uL (ref 150–400)
RBC: 4.04 MIL/uL (ref 3.87–5.11)
RDW: 17.5 % — ABNORMAL HIGH (ref 11.5–15.5)
WBC: 8.9 10*3/uL (ref 4.0–10.5)
nRBC: 0 % (ref 0.0–0.2)

## 2023-02-17 LAB — TROPONIN I (HIGH SENSITIVITY)
Troponin I (High Sensitivity): 24 ng/L — ABNORMAL HIGH (ref <18)
Troponin I (High Sensitivity): 23 ng/L — ABNORMAL HIGH (ref <18)

## 2023-02-17 LAB — BASIC METABOLIC PANEL
Anion gap: 14 (ref 5–15)
Anion gap: 14 (ref 5–15)
BUN: 9 mg/dL (ref 8–23)
BUN: 9 mg/dL (ref 8–23)
CO2: 25 mmol/L (ref 22–32)
CO2: 24 mmol/L (ref 22–32)
Calcium: 8.8 mg/dL — ABNORMAL LOW (ref 8.9–10.3)
Calcium: 8.6 mg/dL — ABNORMAL LOW (ref 8.9–10.3)
Chloride: 90 mmol/L — ABNORMAL LOW (ref 98–111)
Chloride: 91 mmol/L — ABNORMAL LOW (ref 98–111)
Creatinine, Ser: 0.69 mg/dL (ref 0.44–1.00)
Creatinine, Ser: 0.7 mg/dL (ref 0.44–1.00)
GFR, Estimated: >60 mL/min (ref >60)
GFR, Estimated: >60 mL/min (ref >60)
Glucose, Bld: 89 mg/dL (ref 70–99)
Glucose, Bld: 106 mg/dL — ABNORMAL HIGH (ref 70–99)
Potassium: 3.4 mmol/L — ABNORMAL LOW (ref 3.5–5.1)
Potassium: 3.2 mmol/L — ABNORMAL LOW (ref 3.5–5.1)
Sodium: 129 mmol/L — ABNORMAL LOW (ref 135–145)
Sodium: 129 mmol/L — ABNORMAL LOW (ref 135–145)

## 2023-02-17 LAB — URINALYSIS, ROUTINE W REFLEX MICROSCOPIC
Bilirubin Urine: NEGATIVE
Glucose, UA: >=500 mg/dL — AB
Hgb urine dipstick: NEGATIVE
Ketones, ur: 5 mg/dL — AB
Nitrite: NEGATIVE
Protein, ur: NEGATIVE mg/dL
Specific Gravity, Urine: 1.01 (ref 1.005–1.030)
WBC, UA: >50 WBC/hpf (ref 0–5)
pH: 6 (ref 5.0–8.0)

## 2023-02-17 LAB — COMPREHENSIVE METABOLIC PANEL
ALT: 16 U/L (ref 0–44)
AST: 17 U/L (ref 15–41)
Albumin: 3.1 g/dL — ABNORMAL LOW (ref 3.5–5.0)
Alkaline Phosphatase: 64 U/L (ref 38–126)
Anion gap: 10 (ref 5–15)
BUN: 11 mg/dL (ref 8–23)
CO2: 23 mmol/L (ref 22–32)
Calcium: 8.5 mg/dL — ABNORMAL LOW (ref 8.9–10.3)
Chloride: 92 mmol/L — ABNORMAL LOW (ref 98–111)
Creatinine, Ser: 0.7 mg/dL (ref 0.44–1.00)
GFR, Estimated: >60 mL/min (ref >60)
Glucose, Bld: 108 mg/dL — ABNORMAL HIGH (ref 70–99)
Potassium: 4 mmol/L (ref 3.5–5.1)
Sodium: 125 mmol/L — ABNORMAL LOW (ref 135–145)
Total Bilirubin: 1.2 mg/dL (ref 0.3–1.2)
Total Protein: 5.8 g/dL — ABNORMAL LOW (ref 6.5–8.1)

## 2023-02-17 LAB — HEPARIN LEVEL (UNFRACTIONATED): Heparin Unfractionated: <0.1 IU/mL — ABNORMAL LOW (ref 0.30–0.70)

## 2023-02-17 LAB — LACTIC ACID, PLASMA: Lactic Acid, Venous: 0.8 mmol/L (ref 0.5–1.9)

## 2023-02-17 LAB — ECHOCARDIOGRAM COMPLETE
AR max vel: 1.8 cm2
AV Area VTI: 2.3 cm2
AV Area mean vel: 1.84 cm2
AV Mean grad: 10 mmHg
AV Peak grad: 17.5 mmHg
AV Vena cont: 0.3 cm
Ao pk vel: 2.09 m/s
Area-P 1/2: 2.62 cm2
Height: 63 in
S' Lateral: 2.7 cm
Weight: 2560 oz

## 2023-02-17 LAB — TSH: TSH: 1.499 u[IU]/mL (ref 0.350–4.500)

## 2023-02-17 LAB — LIPASE, BLOOD: Lipase: 35 U/L (ref 11–51)

## 2023-02-17 LAB — OSMOLALITY: Osmolality: 265 mOsm/kg — ABNORMAL LOW (ref 275–295)

## 2023-02-17 LAB — D-DIMER, QUANTITATIVE: D-Dimer, Quant: <0.27 ug/mL-FEU (ref 0.00–0.50)

## 2023-02-17 LAB — BRAIN NATRIURETIC PEPTIDE: B Natriuretic Peptide: 454.3 pg/mL — ABNORMAL HIGH (ref 0.0–100.0)

## 2023-02-17 MED ORDER — HEPARIN BOLUS VIA INFUSION
4000.0000 [IU] | Freq: Once | INTRAVENOUS | Status: AC
  Administered 2023-02-17: 4000 [IU] via INTRAVENOUS
  Filled 2023-02-17: qty 4000

## 2023-02-17 MED ORDER — TRIAMCINOLONE ACETONIDE 0.5 % EX OINT
1.0000 | TOPICAL_OINTMENT | Freq: Two times a day (BID) | CUTANEOUS | Status: DC
  Administered 2023-02-18: 1 via TOPICAL
  Filled 2023-02-17: qty 15

## 2023-02-17 MED ORDER — CARVEDILOL 12.5 MG PO TABS
12.5000 mg | ORAL_TABLET | Freq: Two times a day (BID) | ORAL | Status: DC
  Administered 2023-02-17: 12.5 mg via ORAL
  Filled 2023-02-17: qty 1

## 2023-02-17 MED ORDER — HEPARIN (PORCINE) 25000 UT/250ML-% IV SOLN
1100.0000 [IU]/h | INTRAVENOUS | Status: DC
  Administered 2023-02-17: 800 [IU]/h via INTRAVENOUS
  Administered 2023-02-18: 1100 [IU]/h via INTRAVENOUS
  Filled 2023-02-17 (×2): qty 250

## 2023-02-17 MED ORDER — PROCHLORPERAZINE MALEATE 10 MG PO TABS: 10.0000 mg | ORAL_TABLET | Freq: Four times a day (QID) | ORAL | Status: DC | PRN

## 2023-02-17 MED ORDER — ACYCLOVIR 400 MG PO TABS
400.0000 mg | ORAL_TABLET | Freq: Two times a day (BID) | ORAL | Status: DC
  Administered 2023-02-17 – 2023-02-19 (×4): 400 mg via ORAL
  Filled 2023-02-17 (×5): qty 1

## 2023-02-17 MED ORDER — GABAPENTIN 300 MG PO CAPS
900.0000 mg | ORAL_CAPSULE | Freq: Two times a day (BID) | ORAL | Status: DC
  Administered 2023-02-17: 900 mg via ORAL
  Filled 2023-02-17: qty 3

## 2023-02-17 MED ORDER — POLYETHYLENE GLYCOL 3350 17 G PO PACK: 17.0000 g | PACK | Freq: Every day | ORAL | Status: DC | PRN

## 2023-02-17 MED ORDER — ONDANSETRON HCL 4 MG PO TABS: 8.0000 mg | ORAL_TABLET | Freq: Three times a day (TID) | ORAL | Status: DC | PRN

## 2023-02-17 MED ORDER — ACETAMINOPHEN 650 MG RE SUPP: 650.0000 mg | Freq: Four times a day (QID) | RECTAL | Status: DC | PRN

## 2023-02-17 MED ORDER — ASPIRIN 81 MG PO TBEC
81.0000 mg | DELAYED_RELEASE_TABLET | Freq: Every day | ORAL | Status: DC
  Administered 2023-02-17 – 2023-02-18 (×2): 81 mg via ORAL
  Filled 2023-02-17 (×2): qty 1

## 2023-02-17 MED ORDER — SODIUM CHLORIDE 0.9% FLUSH: 3.0000 mL | Freq: Two times a day (BID) | INTRAVENOUS | Status: DC

## 2023-02-17 MED ORDER — PRAVASTATIN SODIUM 40 MG PO TABS
40.0000 mg | ORAL_TABLET | Freq: Every evening | ORAL | Status: DC
  Administered 2023-02-17 – 2023-02-18 (×2): 40 mg via ORAL
  Filled 2023-02-17 (×2): qty 1

## 2023-02-17 MED ORDER — RAMELTEON 8 MG PO TABS
8.0000 mg | ORAL_TABLET | Freq: Every day | ORAL | Status: DC
  Administered 2023-02-17: 8 mg via ORAL
  Filled 2023-02-17: qty 1

## 2023-02-17 MED ORDER — NITROGLYCERIN IN D5W 200-5 MCG/ML-% IV SOLN
0.0000 ug/min | INTRAVENOUS | Status: DC
  Administered 2023-02-17: 5 ug/min via INTRAVENOUS
  Filled 2023-02-17: qty 250

## 2023-02-17 MED ORDER — ACETAMINOPHEN 325 MG PO TABS: 650.0000 mg | ORAL_TABLET | Freq: Four times a day (QID) | ORAL | Status: DC | PRN

## 2023-02-17 MED ORDER — LEVOTHYROXINE SODIUM 100 MCG PO TABS
100.0000 ug | ORAL_TABLET | Freq: Every day | ORAL | Status: DC
  Administered 2023-02-18 – 2023-02-19 (×2): 100 ug via ORAL
  Filled 2023-02-17 (×3): qty 1

## 2023-02-17 MED ORDER — LOSARTAN POTASSIUM 25 MG PO TABS: 25.0000 mg | ORAL_TABLET | Freq: Every day | ORAL | Status: DC

## 2023-02-17 MED ORDER — FENTANYL CITRATE PF 50 MCG/ML IJ SOSY
50.0000 ug | PREFILLED_SYRINGE | Freq: Once | INTRAMUSCULAR | Status: AC
  Administered 2023-02-17: 50 ug via INTRAVENOUS
  Filled 2023-02-17: qty 1

## 2023-02-17 MED ORDER — HEPARIN BOLUS VIA INFUSION
2000.0000 [IU] | Freq: Once | INTRAVENOUS | Status: AC
  Administered 2023-02-17: 2000 [IU] via INTRAVENOUS
  Filled 2023-02-17: qty 2000

## 2023-02-17 MED ORDER — PANTOPRAZOLE SODIUM 40 MG PO TBEC
40.0000 mg | DELAYED_RELEASE_TABLET | Freq: Every day | ORAL | Status: DC
  Administered 2023-02-18 – 2023-02-19 (×2): 40 mg via ORAL
  Filled 2023-02-17 (×2): qty 1

## 2023-02-17 MED ORDER — FUROSEMIDE 10 MG/ML IJ SOLN
40.0000 mg | Freq: Once | INTRAMUSCULAR | Status: AC
  Administered 2023-02-17: 40 mg via INTRAVENOUS
  Filled 2023-02-17: qty 4

## 2023-02-17 MED ORDER — LORAZEPAM 2 MG/ML IJ SOLN
1.0000 mg | Freq: Once | INTRAMUSCULAR | Status: AC
  Administered 2023-02-17: 1 mg via INTRAVENOUS
  Filled 2023-02-17: qty 1

## 2023-02-17 NOTE — Progress Notes (Signed)
Patient arrived at the unit,CHG bath given,vitals taken,CCMD notified, no complaints of pain

## 2023-02-17 NOTE — ED Provider Notes (Signed)
Care of patient received from prior provider at 11:30 PM, please see their note for complete H/P and care plan.  Received handoff per ED course.  Clinical Course as of 02/17/23 2330  Sat Feb 17, 2023  1513 Stable 70 YOF with a chief complaint of chest pain Follows with [facility]  HX of MM on chemo Cards recommended medicine admit and plan to admit. Hyponatremia as well  [CC]    Clinical Course User Index [CC] Glyn Ade, MD      Glyn Ade, MD 02/17/23 2330

## 2023-02-17 NOTE — ED Notes (Signed)
ED TO INPATIENT HANDOFF REPORT  ED Nurse Name and Phone #:  Tori 29  S Name/Age/Gender Zoe [name] 89 y.o. female Room/Bed: RESUSC/RESUSC  Code Status   Code Status: Full Code  Home/SNF/Other Home Patient oriented to: self, place, time, and situation Is this baseline? Yes   Triage Complete: Triage complete  Chief Complaint Chest pain, rule out acute myocardial infarction [R07.9]  Triage Note Pt BIB EMS due to chest pain at 10pm last night. Pt took Protonix and 81mg  of aspirin last night with no relief . Pt received 324mg  of aspirin and 1 nitro with EMS. Pt states cp 10/10. Pt being traeted with multiple myeloma. Pt currently taking chemo.    Allergies Allergies  Allergen Reactions   Penicillins Other (See Comments)    UNSPECIFIED REACTION  Patient does not remember reaction.  Has patient had a PCN reaction causing immediate rash, facial/tongue/throat swelling, SOB or lightheadedness with hypotension: no Has patient had a PCN reaction causing severe rash involving mucus membranes or skin necrosis: no Has patient had a PCN reaction that required hospitalization no Has patient had a PCN reaction occurring within the last 10 years: no If all of the above answers are "NO", then may proceed with Cephalosporin use.    Sulfa Antibiotics Other (See Comments)    UNSPECIFIED REACTION  "maybe vision issues? "    Level of Care/Admitting Diagnosis ED Disposition     ED Disposition  Admit   Condition  --   Comment  Hospital Area: [facility] [[id number]]  Level of Care: Progressive [102]  Admit to Progressive based on following criteria: CARDIOVASCULAR & THORACIC of moderate stability with acute coronary syndrome symptoms/low risk myocardial infarction/hypertensive urgency/arrhythmias/heart failure potentially compromising stability and stable post cardiovascular intervention patients.  Admit to Progressive based on following criteria: NEPHROLOGY stable  condition requiring close monitoring for AKI, requiring Hemodialysis or Peritoneal Dialysis either from expected electrolyte imbalance, acidosis, or fluid overload that can be managed by NIPPV or high flow oxygen.  May admit patient to [facility] or [facility] if equivalent level of care is available:: No  Covid Evaluation: Asymptomatic - no recent exposure (last 10 days) testing not required  Diagnosis: Chest pain, rule out acute myocardial infarction [[id number]]  Admitting Physician: Synetta Fail [[id number]]  Attending Physician: Synetta Fail [phone]  Certification:: I certify this patient will need inpatient services for at least 2 midnights  Estimated Length of Stay: 2          B Medical/Surgery History Past Medical History:  Diagnosis Date   Arthritis    some - per patient   Breast cancer (HCC)    breast cancer / left    Cataract    bilat    GERD (gastroesophageal reflux disease)    History of kidney stones    Hyperlipidemia    Hypertension    Hypothyroidism    Macular degeneration    Left   S/P TAVR (transcatheter aortic valve replacement) 09/03/2018   23 mm Edwards Sapien 3 transcatheter heart valve placed via percutaneous right transfemoral approach    Severe aortic stenosis    Stress incontinence    Thyroid disease    Tinnitus    Past Surgical History:  Procedure Laterality Date   ABDOMINAL HYSTERECTOMY  1970's   BACK SURGERY     BREAST LUMPECTOMY  12/1998   lumpectomy   CARDIAC CATHETERIZATION     EYE SURGERY     cataract surgery bilat  INTRAOPERATIVE TRANSTHORACIC ECHOCARDIOGRAM N/A 09/03/2018   Procedure: INTRAOPERATIVE TRANSTHORACIC ECHOCARDIOGRAM;  Surgeon: Kathleene Hazel, MD;  Location: [facility] OR;  Service: Open Heart Surgery;  Laterality: N/A;   KYPHOPLASTY N/A 09/07/2022   Procedure: THORACIC EIGHT KYPHOPLASTY;  Surgeon: Venita Lick, MD;  Location: [facility] OR;  Service: Orthopedics;  Laterality: N/A;  1 hr Local with IV [facility] 3  C-Bed   LITHOTRIPSY     Right total knee     2018 Dr. Charlann Boxer   RIGHT/LEFT HEART CATH AND CORONARY ANGIOGRAPHY N/A 08/06/2018   Procedure: RIGHT/LEFT HEART CATH AND CORONARY ANGIOGRAPHY;  Surgeon: Yates Decamp, MD;  Location: [facility] INVASIVE CV LAB;  Service: Cardiovascular;  Laterality: N/A;   THYROIDECTOMY, PARTIAL  1975   TONSILLECTOMY     as a child - patient not sure of exact date   TOTAL KNEE ARTHROPLASTY Left 03/13/2016   Procedure: TOTAL KNEE ARTHROPLASTY;  Surgeon: Durene Romans, MD;  Location: [facility] ORS;  Service: Orthopedics;  Laterality: Left;   TOTAL KNEE ARTHROPLASTY Right 06/18/2017   Procedure: RIGHT TOTAL KNEE ARTHROPLASTY;  Surgeon: Durene Romans, MD;  Location: [facility] ORS;  Service: Orthopedics;  Laterality: Right;   TRANSCATHETER AORTIC VALVE REPLACEMENT, TRANSFEMORAL N/A 09/03/2018   Procedure: TRANSCATHETER AORTIC VALVE REPLACEMENT, TRANSFEMORAL;  Surgeon: Kathleene Hazel, MD;  Location: [facility] OR;  Service: Open Heart Surgery;  Laterality: N/A;     A IV Location/Drains/Wounds Patient Lines/Drains/Airways Status     Active Line/Drains/Airways     Name Placement date Placement time Site Days   Peripheral IV 02/17/23 20 G Right Antecubital 02/17/23  0935  Antecubital  less than 1   Incision (Closed) 09/07/22 Back Other (Comment) 09/07/22  1323  -- 163            Intake/Output Last 24 hours No intake or output data in the 24 hours ending 02/17/23 1725  Labs/Imaging Results for orders placed or performed during the hospital encounter of 02/17/23 (from the past 48 hour(s))  CBC with Differential     Status: Abnormal   Collection Time: 02/17/23  9:22 AM  Result Value Ref Range   WBC 8.9 4.0 - 10.5 K/uL   RBC 4.04 3.87 - 5.11 MIL/uL   Hemoglobin 12.1 12.0 - 15.0 g/dL   HCT 16.1 (L) 09.6 - 04.5 %   MCV 87.4 80.0 - 100.0 fL   MCH 30.0 26.0 - 34.0 pg   MCHC 34.3 30.0 - 36.0 g/dL   RDW 40.9 (H) 81.1 - 91.4 %   Platelets 278 150 - 400 K/uL   nRBC 0.0 0.0 - 0.2 %    Neutrophils Relative % 72 %   Neutro Abs 6.4 1.7 - 7.7 K/uL   Lymphocytes Relative 8 %   Lymphs Abs 0.7 0.7 - 4.0 K/uL   Monocytes Relative 17 %   Monocytes Absolute 1.5 (H) 0.1 - 1.0 K/uL   Eosinophils Relative 2 %   Eosinophils Absolute 0.2 0.0 - 0.5 K/uL   Basophils Relative 1 %   Basophils Absolute 0.1 0.0 - 0.1 K/uL   Immature Granulocytes 0 %   Abs Immature Granulocytes 0.02 0.00 - 0.07 K/uL    Comment: Performed at [facility], [address] [place], [place] [address]  Comprehensive metabolic panel     Status: Abnormal   Collection Time: 02/17/23  9:22 AM  Result Value Ref Range   Sodium 125 (L) 135 - 145 mmol/L   Potassium 4.0 3.5 - 5.1 mmol/L   Chloride 92 (L) 98 -  111 mmol/L   CO2 23 22 - 32 mmol/L   Glucose, Bld 108 (H) 70 - 99 mg/dL    Comment: Glucose reference range applies only to samples taken after fasting for at least 8 hours.   BUN 11 8 - 23 mg/dL   Creatinine, Ser 1.61 0.44 - 1.00 mg/dL   Calcium 8.5 (L) 8.9 - 10.3 mg/dL   Total Protein 5.8 (L) 6.5 - 8.1 g/dL   Albumin 3.1 (L) 3.5 - 5.0 g/dL   AST 17 15 - 41 U/L   ALT 16 0 - 44 U/L   Alkaline Phosphatase 64 38 - 126 U/L   Total Bilirubin 1.2 0.3 - 1.2 mg/dL   GFR, Estimated >09 >60 mL/min    Comment: (NOTE) Calculated using the CKD-EPI Creatinine Equation (2021)    Anion gap 10 5 - 15    Comment: Performed at [facility], [address], [place], [place] [address]  Lipase, blood     Status: None   Collection Time: 02/17/23  9:22 AM  Result Value Ref Range   Lipase 35 11 - 51 U/L    Comment: Performed at [facility], [address], [place], [place] [address]  Troponin I (High Sensitivity)     Status: Abnormal   Collection Time: 02/17/23  9:22 AM  Result Value Ref Range   Troponin I (High Sensitivity) 24 (H) <18 ng/L    Comment: (NOTE) Elevated high sensitivity troponin I (hsTnI) values and significant  changes across serial measurements may suggest ACS but many other  chronic  and acute conditions are known to elevate hsTnI results.  Refer to the "Links" section for chest pain algorithms and additional  guidance. Performed at [facility], [address], [place], [place] [address]   Brain natriuretic peptide     Status: Abnormal   Collection Time: 02/17/23  9:22 AM  Result Value Ref Range   B Natriuretic Peptide 454.3 (H) 0.0 - 100.0 pg/mL    Comment: Performed at [facility], [address], [place], [place] [address]  TSH     Status: None   Collection Time: 02/17/23  9:22 AM  Result Value Ref Range   TSH 1.499 0.350 - 4.500 uIU/mL    Comment: Performed by a 3rd Generation assay with a functional sensitivity of <=0.01 uIU/mL. Performed at [facility], [address], [place], [place] [address]   D-dimer, quantitative     Status: None   Collection Time: 02/17/23  9:22 AM  Result Value Ref Range   D-Dimer, Quant <0.27 0.00 - 0.50 ug/mL-FEU    Comment: (NOTE) At the manufacturer cut-off value of 0.5 g/mL FEU, this assay has a negative predictive value of 95-100%.This assay is intended for use in conjunction with a clinical pretest probability (PTP) assessment model to exclude pulmonary embolism (PE) and deep venous thrombosis (DVT) in outpatients suspected of PE or DVT. Results should be correlated with clinical presentation. Performed at [facility], [address] [place], [place] [address]   Lactic acid, plasma     Status: None   Collection Time: 02/17/23  9:48 AM  Result Value Ref Range   Lactic Acid, Venous 0.8 0.5 - 1.9 mmol/L    Comment: Performed at [facility], [address], [place], [place] [address]  Troponin I (High Sensitivity)     Status: Abnormal   Collection Time: 02/17/23 11:15 AM  Result Value Ref Range  Troponin I (High Sensitivity) 23 (H) <18 ng/L    Comment: (NOTE) Elevated high sensitivity troponin I (hsTnI) values and significant  changes across serial measurements may suggest ACS but  many other  chronic and acute conditions are known to elevate hsTnI results.  Refer to the "Links" section for chest pain algorithms and additional  guidance. Performed at [facility], [address], [place], [place] [address]   Urinalysis, Routine w reflex microscopic -Urine, Clean Catch     Status: Abnormal   Collection Time: 02/17/23  2:48 PM  Result Value Ref Range   Color, Urine YELLOW YELLOW   APPearance CLOUDY (A) CLEAR   Specific Gravity, Urine 1.010 1.005 - 1.030   pH 6.0 5.0 - 8.0   Glucose, UA >=500 (A) NEGATIVE mg/dL   Hgb urine dipstick NEGATIVE NEGATIVE   Bilirubin Urine NEGATIVE NEGATIVE   Ketones, ur 5 (A) NEGATIVE mg/dL   Protein, ur NEGATIVE NEGATIVE mg/dL   Nitrite NEGATIVE NEGATIVE   Leukocytes,Ua LARGE (A) NEGATIVE   RBC / HPF 11-20 0 - 5 RBC/hpf   WBC, UA >50 0 - 5 WBC/hpf   Bacteria, UA RARE (A) NONE SEEN   Squamous Epithelial / HPF 0-5 0 - 5 /HPF   WBC Clumps PRESENT    Mucus PRESENT     Comment: Performed at [facility], [address], [place], [place] [address]   CT ABDOMEN PELVIS WO CONTRAST  Result Date: 02/17/2023 CLINICAL DATA:  Abdominal pain, acute, nonlocalized abd pain, distension, EXAM: CT ABDOMEN AND PELVIS WITHOUT CONTRAST TECHNIQUE: Multidetector CT imaging of the abdomen and pelvis was performed following the standard protocol without IV contrast. RADIATION DOSE REDUCTION: This exam was performed according to the departmental dose-optimization program which includes automated exposure control, adjustment of the mA and/or kV according to patient size and/or use of iterative reconstruction technique. COMPARISON:  MR 09/23/2022 and previous FINDINGS: Lower chest: Post TAVR. Small pleural effusions. Dependent atelectasis posteriorly in both lower lobes. 9 mm nodule in the posterior right lower lobe (Im5,Se4) , new since 08/15/2018. Hepatobiliary: 6.7 cm low-attenuation lesion in hepatic segment 8 corresponding to hemangioma on MR  09/23/2022. No new liver lesion or biliary ductal dilatation. Gallbladder incompletely distended. No calcified gallstones. Pancreas: Unremarkable. No pancreatic ductal dilatation or surrounding inflammatory changes. Spleen: Normal in size without focal abnormality. Adrenals/Urinary Tract: No adrenal mass. Symmetric renal contours without urolithiasis. No hydronephrosis. Urinary bladder partially distended. Stomach/Bowel: Stomach is nondistended, unremarkable. Small bowel decompressed. Calcified appendicoliths without appendiceal wall thickening or regional inflammatory change. The colon is partially distended by gas and fecal material, without acute finding. Vascular/Lymphatic: Coarse aortoiliac calcified atheromatous plaque without aneurysm. No abdominal or pelvic adenopathy. Reproductive: Status post hysterectomy. No adnexal masses. Other: Bilateral pelvic phleboliths.  No ascites.  No free air. Musculoskeletal: Post vertebral augmentation T8 and T12. Multilevel lumbar spondylitic change with grade 1 anterolisthesis L4-5, presumably related to advanced facet DJD. No fracture or worrisome bone lesion. IMPRESSION: 1. No acute findings. 2. 9 mm right lower lobe pulmonary nodule. Per Fleischner Society Guidelines, recommend prompt non-contrast Chest CT for further evaluation. These guidelines do not apply to immunocompromised patients and patients with cancer. Follow up in patients with significant comorbidities as clinically warranted. For lung cancer screening, adhere to Lung-RADS guidelines. Reference: Radiology. 2017; 284(1):228-43. 3. Aortic Atherosclerosis (ICD10-I70.0). Electronically Signed   By: Corlis Leak M.D.   On: 02/17/2023 14:56   ECHOCARDIOGRAM COMPLETE  Result Date: 02/17/2023    ECHOCARDIOGRAM REPORT  Patient Name:   Zoe [name] Date of Exam: 02/17/2023 Medical Rec #:  [id number]           Height:       63.0 in Accession #:    [id number]          Weight:       160.0 lb Date of Birth:   12/21/1933            BSA:          1.759 m Patient Age:    89 years            BP:           162/76 mmHg Patient Gender: F                   HR:           76 bpm. Exam Location:  Inpatient Procedure: 2D Echo, Cardiac Doppler, Color Doppler and Strain Analysis Indications:    R07.9* Chest pain, unspecified  History:        Patient has prior history of Echocardiogram examinations, most                 recent 04/03/2022. CHF, Multiple, Aortic Valve Disease; Risk                 Factors:Hypertension, Dyslipidemia and Non-Smoker.  Sonographer:    Dondra Prader RVT RCS Referring Phys: [id number] [facility] CUSTOVIC  Sonographer Comments: Global longitudinal strain was attempted. IMPRESSIONS  1. Left ventricular ejection fraction, by estimation, is 55 to 60%. The left ventricle has normal function. The left ventricle has no regional wall motion abnormalities. There is mild left ventricular hypertrophy. Left ventricular diastolic parameters are consistent with Grade II diastolic dysfunction (pseudonormalization). Elevated left atrial pressure.  2. Right ventricular systolic function is normal. The right ventricular size is normal. There is mildly elevated pulmonary artery systolic pressure.  3. Left atrial size was severely dilated.  4. The mitral valve is abnormal. Mild mitral valve regurgitation. Moderate mitral stenosis.  5. The tricuspid valve is abnormal.  6. Edwards Sapien 3 THV size 23 mm is in the AV position. . The aortic valve has been repaired/replaced. Aortic valve regurgitation is not visualized. No aortic stenosis is present.  7. The inferior vena cava is dilated in size with >50% respiratory variability, suggesting right atrial pressure of 8 mmHg. FINDINGS  Left Ventricle: Left ventricular ejection fraction, by estimation, is 55 to 60%. The left ventricle has normal function. The left ventricle has no regional wall motion abnormalities. Global longitudinal strain performed but not reported based on interpreter  judgement due to suboptimal tracking. The left ventricular internal cavity size was normal in size. There is mild left ventricular hypertrophy. Left ventricular diastolic parameters are consistent with Grade II diastolic dysfunction (pseudonormalization). Elevated left atrial pressure. Right Ventricle: The right ventricular size is normal. Right vetricular wall thickness was not well visualized. Right ventricular systolic function is normal. There is mildly elevated pulmonary artery systolic pressure. The tricuspid regurgitant velocity  is 2.92 m/s, and with an assumed right atrial pressure of 8 mmHg, the estimated right ventricular systolic pressure is 42.1 mmHg. Left Atrium: Left atrial size was severely dilated. Right Atrium: Right atrial size was normal in size. Pericardium: There is no evidence of pericardial effusion. Mitral Valve: The mitral valve is abnormal. There is mild thickening of the mitral valve leaflet(s). There is mild calcification of the mitral valve leaflet(s). Mild mitral annular calcification.  Mild mitral valve regurgitation. Moderate mitral valve stenosis. The mean mitral valve gradient is 6.0 mmHg. Tricuspid Valve: The tricuspid valve is abnormal. Tricuspid valve regurgitation is mild . No evidence of tricuspid stenosis. Aortic Valve: Edwards Sapien 3 THV size 23 mm is in the AV position. The aortic valve has been repaired/replaced. Aortic valve regurgitation is not visualized. No aortic stenosis is present. Aortic valve mean gradient measures 10.0 mmHg. Aortic valve peak gradient measures 17.5 mmHg. Aortic valve area, by VTI measures 2.30 cm. Pulmonic Valve: The pulmonic valve was not well visualized. Pulmonic valve regurgitation is mild. No evidence of pulmonic stenosis. Aorta: The aortic root is normal in size and structure. Venous: The inferior vena cava is dilated in size with greater than 50% respiratory variability, suggesting right atrial pressure of 8 mmHg. IAS/Shunts: No atrial  level shunt detected by color flow Doppler.  LEFT VENTRICLE PLAX 2D LVIDd:         3.80 cm   Diastology LVIDs:         2.70 cm   LV e' medial:    5.52 cm/s LV PW:         1.20 cm   LV E/e' medial:  33.3 LV IVS:        1.20 cm   LV e' lateral:   5.56 cm/s LVOT diam:     1.60 cm   LV E/e' lateral: 33.1 LV SV:         81 LV SV Index:   46 LVOT Area:     2.01 cm  RIGHT VENTRICLE             IVC RV Basal diam:  4.00 cm     IVC diam: 2.30 cm RV Mid diam:    3.10 cm RV S prime:     13.30 cm/s TAPSE (M-mode): 2.5 cm LEFT ATRIUM             Index        RIGHT ATRIUM           Index LA diam:        4.20 cm 2.39 cm/m   RA Area:     16.60 cm LA Vol (A2C):   93.4 ml 53.11 ml/m  RA Volume:   38.50 ml  21.89 ml/m LA Vol (A4C):   88.6 ml 50.38 ml/m LA Biplane Vol: 92.3 ml 52.48 ml/m  AORTIC VALVE                     PULMONIC VALVE AV Area (Vmax):    1.80 cm      PV Vmax:       0.88 m/s AV Area (Vmean):   1.84 cm      PV Peak grad:  3.1 mmHg AV Area (VTI):     2.30 cm AV Vmax:           209.00 cm/s AV Vmean:          143.000 cm/s AV VTI:            0.351 m AV Peak Grad:      17.5 mmHg AV Mean Grad:      10.0 mmHg LVOT Vmax:         187.00 cm/s LVOT Vmean:        131.000 cm/s LVOT VTI:          0.401 m LVOT/AV VTI ratio: 1.14 AR Vena Contracta: 0.30 cm  AORTA Ao Root diam: 2.40 cm  Ao Asc diam:  2.70 cm Ao Arch diam: 3.0 cm MITRAL VALVE                TRICUSPID VALVE MV Area (PHT): 2.62 cm     TR Peak grad:   34.1 mmHg MV Mean grad:  6.0 mmHg     TR Vmax:        292.00 cm/s MV Decel Time: 290 msec MV E velocity: 184.00 cm/s  SHUNTS MV A velocity: 133.00 cm/s  Systemic VTI:  0.40 m MV E/A ratio:  1.38         Systemic Diam: 1.60 cm Dina Rich MD Electronically signed by Dina Rich MD Signature Date/Time: 02/17/2023/2:51:58 PM    Final    DG Chest 2 View  Result Date: 02/17/2023 CLINICAL DATA:  Chest pain EXAM: CHEST - 2 VIEW COMPARISON:  Chest x-ray May 15, 2022 FINDINGS: The patient is status post TAVR. Stable  cardiomegaly. The hila and mediastinum are unchanged. No pneumothorax. No nodules or masses. No focal infiltrates. No overt edema. The patient is status post vertebroplasties at 2 levels. No acute bony changes. Possible tiny pleural effusions. IMPRESSION: 1. Possible tiny pleural effusions. No other acute abnormalities. 2. Stable cardiomegaly. Electronically Signed   By: Gerome Sam III M.D.   On: 02/17/2023 09:41    Pending Labs Unresulted Labs (From admission, onward)     Start     Ordered   02/19/23 0500  Heparin level (unfractionated)  Daily,   R      02/17/23 1311   02/18/23 0500  CBC  Tomorrow morning,   R        02/17/23 1720   02/17/23 2200  Heparin level (unfractionated)  Once-Timed,   URGENT        02/17/23 1311   02/17/23 1720  Osmolality, urine  Add-on,   AD        02/17/23 1720   02/17/23 1720  Osmolality  Add-on,   AD        02/17/23 1720   02/17/23 1720  Sodium, urine, random  Add-on,   AD        02/17/23 1720   02/17/23 1718  Basic metabolic panel  Now then every 4 hours,   R (with TIMED occurrences)      02/17/23 1720            Vitals/Pain Today's Vitals   02/17/23 1530 02/17/23 1616 02/17/23 1630 02/17/23 1700  BP: (!) 171/82  (!) 126/107 (!) 110/98  Pulse: 81  85 83  Resp: 18  (!) 22 (!) 21  Temp:  97.7 F (36.5 C)    TempSrc:  Oral    SpO2: 95%  96% 96%  Weight:      Height:      PainSc:        Isolation Precautions No active isolations  Medications Medications  nitroGLYCERIN 50 mg in dextrose 5 % 250 mL (0.2 mg/mL) infusion (5 mcg/min Intravenous New Bag/Given 02/17/23 1311)  heparin ADULT infusion 100 units/mL (25000 units/262mL) (800 Units/hr Intravenous New Bag/Given 02/17/23 1321)  losartan (COZAAR) tablet 25 mg (has no administration in time range)  carvedilol (COREG) tablet 12.5 mg (has no administration in time range)  pravastatin (PRAVACHOL) tablet 40 mg (has no administration in time range)  aspirin EC tablet 81 mg (has no  administration in time range)  levothyroxine (SYNTHROID) tablet 100 mcg (has no administration in time range)  pantoprazole (PROTONIX) EC tablet 40 mg (has no  administration in time range)  gabapentin (NEURONTIN) capsule 900 mg (has no administration in time range)  acyclovir (ZOVIRAX) tablet 400 mg (has no administration in time range)  prochlorperazine (COMPAZINE) tablet 10 mg (has no administration in time range)  ondansetron (ZOFRAN) tablet 8 mg (has no administration in time range)  triamcinolone ointment (KENALOG) 0.5 % 1 Application (has no administration in time range)  sodium chloride flush (NS) 0.9 % injection 3 mL (has no administration in time range)  acetaminophen (TYLENOL) tablet 650 mg (has no administration in time range)    Or  acetaminophen (TYLENOL) suppository 650 mg (has no administration in time range)  polyethylene glycol (MIRALAX / GLYCOLAX) packet 17 g (has no administration in time range)  fentaNYL (SUBLIMAZE) injection 50 mcg (50 mcg Intravenous Given 02/17/23 1011)  LORazepam (ATIVAN) injection 1 mg (1 mg Intravenous Given 02/17/23 1220)  fentaNYL (SUBLIMAZE) injection 50 mcg (50 mcg Intravenous Given 02/17/23 1220)  heparin bolus via infusion 4,000 Units (4,000 Units Intravenous Bolus from Bag 02/17/23 1321)  furosemide (LASIX) injection 40 mg (40 mg Intravenous Given 02/17/23 1719)    Mobility walks     Focused Assessments Cardiac Assessment Handoff:    Lab Results  Component Value Date   CKTOTAL 20 (L) 09/30/2022   CKMB 1.6 04/02/2011   TROPONINI <0.30 04/02/2011   Lab Results  Component Value Date   DDIMER <0.27 02/17/2023   Does the Patient currently have chest pain? No    R Recommendations: See Admitting Provider Note  Report given to:   Additional Notes: axox4 on nitro gtt

## 2023-02-17 NOTE — Progress Notes (Signed)
Patient presented to the ED with severe chest pain and abdominal pain.  She has multiple electrolyte derangements and physical complaints. EKG is normal and unchanged compared to prior, no dynamic EKG changes noted. Troponins are slightly elevated but flat. Will place patient on heparin and nitro drips. Stat echocardiogram has been ordered. No plans for cardiac intervention at this time as she has many medical issues and is quite frail unless ST elevation or severe troponin elevation occurs. Discussed with Dr. Jacinto Halim.  Spoke with the ED doctor. Formal consult to follow in the morning. Please call cardiology with any acute changes to the patient's clinical status.    Clotilde Dieter, DO 02/17/23 3:10 PM [facility] # [phone]

## 2023-02-17 NOTE — ED Triage Notes (Addendum)
Pt BIB EMS due to chest pain at 10pm last night. Pt took Protonix and 81mg  of aspirin last night with no relief . Pt received 324mg  of aspirin and 1 nitro with EMS. Pt states cp 10/10. Pt being traeted with multiple myeloma. Pt currently taking chemo.

## 2023-02-17 NOTE — Progress Notes (Signed)
ANTICOAGULATION CONSULT NOTE - Initial Consult  Pharmacy Consult for Heparin Indication:  unstable angina  Allergies  Allergen Reactions   Penicillins Other (See Comments)    UNSPECIFIED REACTION  Patient does not remember reaction.  Has patient had a PCN reaction causing immediate rash, facial/tongue/throat swelling, SOB or lightheadedness with hypotension: no Has patient had a PCN reaction causing severe rash involving mucus membranes or skin necrosis: no Has patient had a PCN reaction that required hospitalization no Has patient had a PCN reaction occurring within the last 10 years: no If all of the above answers are "NO", then may proceed with Cephalosporin use.    Sulfa Antibiotics Other (See Comments)    UNSPECIFIED REACTION  "maybe vision issues? "    Patient Measurements: Height: 5\' 3"  (160 cm) Weight: 72.6 kg (160 lb) IBW/kg (Calculated) : 52.4 Heparin Dosing Weight: 67.6 kg  Vital Signs: Temp: 97.6 F (36.4 C) (04/27 2117) Temp Source: Oral (04/27 2117) BP: 95/60 (04/27 2117) Pulse Rate: 78 (04/27 2117)  Labs: Recent Labs    02/17/23 0922 02/17/23 1115 02/17/23 1859 02/17/23 1955  HGB 12.1  --   --   --   HCT 35.3*  --   --   --   PLT 278  --   --   --   HEPARINUNFRC  --   --  <0.10*  --   CREATININE 0.70  --  0.69 0.70  TROPONINIHS 24* 23*  --   --      Estimated Creatinine Clearance: 47.3 mL/min (by C-G formula based on SCr of 0.7 mg/dL).   Medical History: Past Medical History:  Diagnosis Date   Arthritis    some - per patient   Breast cancer (HCC)    breast cancer / left    Cataract    bilat    GERD (gastroesophageal reflux disease)    History of kidney stones    Hyperlipidemia    Hypertension    Hypothyroidism    Macular degeneration    Left   S/P TAVR (transcatheter aortic valve replacement) 09/03/2018   23 mm Edwards Sapien 3 transcatheter heart valve placed via percutaneous right transfemoral approach    Severe aortic stenosis     Stress incontinence    Thyroid disease    Tinnitus       Assessment: 6 yof with a history of breast cancer, aortic stenosis s/p TAVR, HF, multiple myeloma on chemotherapy, thyroid disease, HTN, kidney stones, GERD. Patient is presenting with chest pain. Heparin per pharmacy consult placed for  unstable angina .  Heparin level <0.1 drawn 5.5 hrs after bolus is subtherapeutic on 800 units/hr.  No issues with infusion or bleeding per RN. No further chest pain. Planning medical management per cards.   Goal of Therapy:  Heparin level 0.3-0.7 units/ml Monitor platelets by anticoagulation protocol: Yes   Plan:  Repeat bolus 2000 unit x1 and increase to 1000 units/hr Monitor daily heparin level, CBC Monitor for signs/symptoms of bleeding   Alphia Moh, PharmD, BCPS, BCCP Clinical Pharmacist  Please check AMION for all [facility] phone numbers After 10:00 PM, call Main Pharmacy [phone]

## 2023-02-17 NOTE — ED Notes (Signed)
Patient transported to X-ray 

## 2023-02-17 NOTE — ED Provider Notes (Signed)
[place] EMERGENCY DEPARTMENT AT [facility] Provider Note   CSN: [id number] Arrival date & time: 02/17/23  4098     History  Chief Complaint  Patient presents with   Chest Pain    Zoe [name] is a 89 y.o. female.  The history is provided by the patient and medical records. No language interpreter was used.  Chest Pain Pain location:  Substernal area Pain quality: aching and dull   Pain radiates to:  Epigastrium Pain severity:  Moderate Onset quality:  Gradual Timing:  Constant Progression:  Waxing and waning Chronicity:  New Context: stress   Context: not trauma   Relieved by:  Nothing Worsened by:  Nothing Ineffective treatments:  None tried Associated symptoms: abdominal pain, anxiety, fatigue and shortness of breath   Associated symptoms: no back pain, no claudication, no cough, no headache, no lower extremity edema (improving per pt), no nausea, no numbness, no palpitations, no vomiting and no weakness        Home Medications Prior to Admission medications   Medication Sig Start Date End Date Taking? Authorizing Provider  acyclovir (ZOVIRAX) 400 MG tablet Take 400 mg by mouth 2 (two) times daily. Pt takes 1 tablet (400 mg total) by mouth 2 (two) times daily.    Jaci Standard, MD  Artificial Tear Solution (SOOTHE XP) SOLN Place 1 drop into both eyes every evening.    [provider]  aspirin EC 81 MG tablet Take 81 mg by mouth at bedtime. Swallow whole.    [provider]  calcitonin, salmon, (MIACALCIN/FORTICAL) 200 UNIT/ACT nasal spray Place 1 spray into alternate nostrils daily.    [provider]  carvedilol (COREG) 12.5 MG tablet Take 12.5 mg by mouth 2 (two) times daily with a meal.    [provider]  Cholecalciferol (VITAMIN D3) 50 MCG (2000 UT) capsule Take 1 capsule (2,000 Units total) by mouth daily. 09/11/22   Jaci Standard, MD  [employer] VITAMIN B12 1000 MCG tablet TAKE 1 TABLET BY MOUTH EVERY  DAY 02/15/23   [name] T, PA-C  dapagliflozin propanediol (FARXIGA) 10 MG TABS tablet TAKE 1 TABLET BY MOUTH EVERY DAY 10/03/22   Yates Decamp, MD  dexamethasone (DECADRON) 4 MG tablet Take 5 tablets (20 mg total) by mouth once a week. Take 20 mg (5 tablets) on day of multiple myeloma treatment 10/27/22   Jaci Standard, MD  esomeprazole (NEXIUM) 20 MG capsule Take 20 mg by mouth daily as needed (Heartburn). 12/06/21   [provider]  ferrous sulfate 325 (65 FE) MG tablet Take 325 mg by mouth daily with breakfast. Patient not taking: Reported on 01/22/2023    [provider]  furosemide (LASIX) 40 MG tablet Take 1 tablet (40 mg total) by mouth daily. 12/26/22   Yates Decamp, MD  gabapentin (NEURONTIN) 300 MG capsule Take 3 capsules (900 mg total) by mouth 2 (two) times daily. 01/10/23   Jaci Standard, MD  hydrocortisone cream 1 % Apply 1 Application topically 2 (two) times daily. 02/13/23   Jaci Standard, MD  lenalidomide (REVLIMID) 25 MG capsule Take 1 capsule (25 mg total) by mouth daily. Take for 14 days,then none for 7 days.Repeat every 21 days. Auth # [id number]  Date obtained 02/08/23 02/08/23   Jaci Standard, MD  levothyroxine (SYNTHROID, LEVOTHROID) 100 MCG tablet Take 100 mcg by mouth daily before breakfast. 12/29/15   [provider]  losartan (COZAAR)  25 MG tablet TAKE 1 TABLET (25 MG TOTAL) BY MOUTH DAILY. 11/27/22   Yates Decamp, MD  methylPREDNISolone (MEDROL DOSEPAK) 4 MG TBPK tablet Take 6 pills by mouth day 1, 5 on day 2, 4 on day 3, 3 on day 4, 2 on day 5, 1 on day 6 01/17/23   Walisiewicz, Kaitlyn E, PA-C  ondansetron (ZOFRAN) 8 MG tablet Take 1 tablet (8 mg total) by mouth every 8 (eight) hours as needed. 07/24/22   Jaci Standard, MD  polyethylene glycol (MIRALAX / GLYCOLAX) 17 g packet Take 17 g by mouth daily as needed for mild constipation.    [provider]  potassium chloride (KLOR-CON M10) 10 MEQ tablet Take 2 tablets (20 mEq total) by  mouth 2 (two) times daily. 12/26/22   Yates Decamp, MD  pravastatin (PRAVACHOL) 40 MG tablet Take 40 mg by mouth every evening.    [provider]  prochlorperazine (COMPAZINE) 10 MG tablet Take 1 tablet (10 mg total) by mouth every 6 (six) hours as needed for nausea or vomiting. 01/01/23   [name] T, PA-C  triamcinolone ointment (KENALOG) 0.5 % Apply 1 Application topically 2 (two) times daily. 01/22/23   Jaci Standard, MD      Allergies    Penicillins and Sulfa antibiotics    Review of Systems   Review of Systems  Constitutional:  Positive for fatigue. Negative for chills.  HENT:  Negative for congestion.   Respiratory:  Positive for shortness of breath. Negative for cough, chest tightness and wheezing.   Cardiovascular:  Positive for chest pain. Negative for palpitations and claudication.  Gastrointestinal:  Positive for abdominal pain and constipation. Negative for diarrhea, nausea and vomiting.  Genitourinary:  Negative for dysuria and flank pain.  Musculoskeletal:  Negative for back pain, neck pain and neck stiffness.  Skin:  Negative for rash and wound.  Neurological:  Negative for weakness, light-headedness, numbness and headaches.  Psychiatric/Behavioral:  Negative for agitation and confusion.   All other systems reviewed and are negative.   Physical Exam Updated Vital Signs BP (!) 162/76   Pulse 74   Temp 97.8 F (36.6 C) (Oral)   Resp (!) 21   Ht 5\' 3"  (1.6 m)   Wt 72.6 kg   SpO2 93%   BMI 28.34 kg/m  Physical Exam Vitals and nursing note reviewed.  Constitutional:      General: She is not in acute distress.    Appearance: She is well-developed. She is not ill-appearing, toxic-appearing or diaphoretic.  HENT:     Head: Normocephalic and atraumatic.  Eyes:     Extraocular Movements: Extraocular movements intact.     Conjunctiva/sclera: Conjunctivae normal.     Pupils: Pupils are equal, round, and reactive to light.  Cardiovascular:     Rate and  Rhythm: Normal rate and regular rhythm.     Heart sounds: Murmur heard.  Pulmonary:     Effort: Pulmonary effort is normal. No tachypnea or respiratory distress.     Breath sounds: Rales present. No decreased breath sounds, wheezing or rhonchi.  Chest:     Chest wall: No tenderness.  Abdominal:     Palpations: Abdomen is soft.     Tenderness: There is abdominal tenderness.  Musculoskeletal:        General: No swelling.     Cervical back: Neck supple.     Right lower leg: Edema present.     Left lower leg: Edema present.  Skin:  General: Skin is warm and dry.     Capillary Refill: Capillary refill takes less than 2 seconds.     Findings: No erythema.  Neurological:     General: No focal deficit present.     Mental Status: She is alert.  Psychiatric:        Mood and Affect: Mood normal.     ED Results / Procedures / Treatments   Labs (all labs ordered are listed, but only abnormal results are displayed) Labs Reviewed  CBC WITH DIFFERENTIAL/PLATELET - Abnormal; Notable for the following components:      Result Value   HCT 35.3 (*)    RDW 17.5 (*)    Monocytes Absolute 1.5 (*)    All other components within normal limits  COMPREHENSIVE METABOLIC PANEL - Abnormal; Notable for the following components:   Sodium 125 (*)    Chloride 92 (*)    Glucose, Bld 108 (*)    Calcium 8.5 (*)    Total Protein 5.8 (*)    Albumin 3.1 (*)    All other components within normal limits  BRAIN NATRIURETIC PEPTIDE - Abnormal; Notable for the following components:   B Natriuretic Peptide 454.3 (*)    All other components within normal limits  TROPONIN I (HIGH SENSITIVITY) - Abnormal; Notable for the following components:   Troponin I (High Sensitivity) 24 (*)    All other components within normal limits  TROPONIN I (HIGH SENSITIVITY) - Abnormal; Notable for the following components:   Troponin I (High Sensitivity) 23 (*)    All other components within normal limits  LACTIC ACID, PLASMA   LIPASE, BLOOD  TSH  D-DIMER, QUANTITATIVE  HEPARIN LEVEL (UNFRACTIONATED)  URINALYSIS, ROUTINE W REFLEX MICROSCOPIC    EKG None  Radiology CT ABDOMEN PELVIS WO CONTRAST  Result Date: 02/17/2023 CLINICAL DATA:  Abdominal pain, acute, nonlocalized abd pain, distension, EXAM: CT ABDOMEN AND PELVIS WITHOUT CONTRAST TECHNIQUE: Multidetector CT imaging of the abdomen and pelvis was performed following the standard protocol without IV contrast. RADIATION DOSE REDUCTION: This exam was performed according to the departmental dose-optimization program which includes automated exposure control, adjustment of the mA and/or kV according to patient size and/or use of iterative reconstruction technique. COMPARISON:  MR 09/23/2022 and previous FINDINGS: Lower chest: Post TAVR. Small pleural effusions. Dependent atelectasis posteriorly in both lower lobes. 9 mm nodule in the posterior right lower lobe (Im5,Se4) , new since 08/15/2018. Hepatobiliary: 6.7 cm low-attenuation lesion in hepatic segment 8 corresponding to hemangioma on MR 09/23/2022. No new liver lesion or biliary ductal dilatation. Gallbladder incompletely distended. No calcified gallstones. Pancreas: Unremarkable. No pancreatic ductal dilatation or surrounding inflammatory changes. Spleen: Normal in size without focal abnormality. Adrenals/Urinary Tract: No adrenal mass. Symmetric renal contours without urolithiasis. No hydronephrosis. Urinary bladder partially distended. Stomach/Bowel: Stomach is nondistended, unremarkable. Small bowel decompressed. Calcified appendicoliths without appendiceal wall thickening or regional inflammatory change. The colon is partially distended by gas and fecal material, without acute finding. Vascular/Lymphatic: Coarse aortoiliac calcified atheromatous plaque without aneurysm. No abdominal or pelvic adenopathy. Reproductive: Status post hysterectomy. No adnexal masses. Other: Bilateral pelvic phleboliths.  No ascites.  No  free air. Musculoskeletal: Post vertebral augmentation T8 and T12. Multilevel lumbar spondylitic change with grade 1 anterolisthesis L4-5, presumably related to advanced facet DJD. No fracture or worrisome bone lesion. IMPRESSION: 1. No acute findings. 2. 9 mm right lower lobe pulmonary nodule. Per Fleischner Society Guidelines, recommend prompt non-contrast Chest CT for further evaluation. These guidelines do not apply to  immunocompromised patients and patients with cancer. Follow up in patients with significant comorbidities as clinically warranted. For lung cancer screening, adhere to Lung-RADS guidelines. Reference: Radiology. 2017; 284(1):228-43. 3. Aortic Atherosclerosis (ICD10-I70.0). Electronically Signed   By: Corlis Leak M.D.   On: 02/17/2023 14:56   ECHOCARDIOGRAM COMPLETE  Result Date: 02/17/2023    ECHOCARDIOGRAM REPORT   Patient Name:   Zoe [name] Date of Exam: 02/17/2023 Medical Rec #:  [id number]           Height:       63.0 in Accession #:    [id number]          Weight:       160.0 lb Date of Birth:  02-24-1934            BSA:          1.759 m Patient Age:    89 years            BP:           162/76 mmHg Patient Gender: F                   HR:           76 bpm. Exam Location:  Inpatient Procedure: 2D Echo, Cardiac Doppler, Color Doppler and Strain Analysis Indications:    R07.9* Chest pain, unspecified  History:        Patient has prior history of Echocardiogram examinations, most                 recent 04/03/2022. CHF, Multiple, Aortic Valve Disease; Risk                 Factors:Hypertension, Dyslipidemia and Non-Smoker.  Sonographer:    Dondra Prader RVT RCS Referring Phys: [id number] [facility] CUSTOVIC  Sonographer Comments: Global longitudinal strain was attempted. IMPRESSIONS  1. Left ventricular ejection fraction, by estimation, is 55 to 60%. The left ventricle has normal function. The left ventricle has no regional wall motion abnormalities. There is mild left ventricular hypertrophy. Left  ventricular diastolic parameters are consistent with Grade II diastolic dysfunction (pseudonormalization). Elevated left atrial pressure.  2. Right ventricular systolic function is normal. The right ventricular size is normal. There is mildly elevated pulmonary artery systolic pressure.  3. Left atrial size was severely dilated.  4. The mitral valve is abnormal. Mild mitral valve regurgitation. Moderate mitral stenosis.  5. The tricuspid valve is abnormal.  6. Edwards Sapien 3 THV size 23 mm is in the AV position. . The aortic valve has been repaired/replaced. Aortic valve regurgitation is not visualized. No aortic stenosis is present.  7. The inferior vena cava is dilated in size with >50% respiratory variability, suggesting right atrial pressure of 8 mmHg. FINDINGS  Left Ventricle: Left ventricular ejection fraction, by estimation, is 55 to 60%. The left ventricle has normal function. The left ventricle has no regional wall motion abnormalities. Global longitudinal strain performed but not reported based on interpreter judgement due to suboptimal tracking. The left ventricular internal cavity size was normal in size. There is mild left ventricular hypertrophy. Left ventricular diastolic parameters are consistent with Grade II diastolic dysfunction (pseudonormalization). Elevated left atrial pressure. Right Ventricle: The right ventricular size is normal. Right vetricular wall thickness was not well visualized. Right ventricular systolic function is normal. There is mildly elevated pulmonary artery systolic pressure. The tricuspid regurgitant velocity  is 2.92 m/s, and with an assumed right atrial pressure of 8 mmHg, the estimated right  ventricular systolic pressure is 42.1 mmHg. Left Atrium: Left atrial size was severely dilated. Right Atrium: Right atrial size was normal in size. Pericardium: There is no evidence of pericardial effusion. Mitral Valve: The mitral valve is abnormal. There is mild thickening of the  mitral valve leaflet(s). There is mild calcification of the mitral valve leaflet(s). Mild mitral annular calcification. Mild mitral valve regurgitation. Moderate mitral valve stenosis. The mean mitral valve gradient is 6.0 mmHg. Tricuspid Valve: The tricuspid valve is abnormal. Tricuspid valve regurgitation is mild . No evidence of tricuspid stenosis. Aortic Valve: Edwards Sapien 3 THV size 23 mm is in the AV position. The aortic valve has been repaired/replaced. Aortic valve regurgitation is not visualized. No aortic stenosis is present. Aortic valve mean gradient measures 10.0 mmHg. Aortic valve peak gradient measures 17.5 mmHg. Aortic valve area, by VTI measures 2.30 cm. Pulmonic Valve: The pulmonic valve was not well visualized. Pulmonic valve regurgitation is mild. No evidence of pulmonic stenosis. Aorta: The aortic root is normal in size and structure. Venous: The inferior vena cava is dilated in size with greater than 50% respiratory variability, suggesting right atrial pressure of 8 mmHg. IAS/Shunts: No atrial level shunt detected by color flow Doppler.  LEFT VENTRICLE PLAX 2D LVIDd:         3.80 cm   Diastology LVIDs:         2.70 cm   LV e' medial:    5.52 cm/s LV PW:         1.20 cm   LV E/e' medial:  33.3 LV IVS:        1.20 cm   LV e' lateral:   5.56 cm/s LVOT diam:     1.60 cm   LV E/e' lateral: 33.1 LV SV:         81 LV SV Index:   46 LVOT Area:     2.01 cm  RIGHT VENTRICLE             IVC RV Basal diam:  4.00 cm     IVC diam: 2.30 cm RV Mid diam:    3.10 cm RV S prime:     13.30 cm/s TAPSE (M-mode): 2.5 cm LEFT ATRIUM             Index        RIGHT ATRIUM           Index LA diam:        4.20 cm 2.39 cm/m   RA Area:     16.60 cm LA Vol (A2C):   93.4 ml 53.11 ml/m  RA Volume:   38.50 ml  21.89 ml/m LA Vol (A4C):   88.6 ml 50.38 ml/m LA Biplane Vol: 92.3 ml 52.48 ml/m  AORTIC VALVE                     PULMONIC VALVE AV Area (Vmax):    1.80 cm      PV Vmax:       0.88 m/s AV Area (Vmean):   1.84  cm      PV Peak grad:  3.1 mmHg AV Area (VTI):     2.30 cm AV Vmax:           209.00 cm/s AV Vmean:          143.000 cm/s AV VTI:            0.351 m AV Peak Grad:      17.5 mmHg AV  Mean Grad:      10.0 mmHg LVOT Vmax:         187.00 cm/s LVOT Vmean:        131.000 cm/s LVOT VTI:          0.401 m LVOT/AV VTI ratio: 1.14 AR Vena Contracta: 0.30 cm  AORTA Ao Root diam: 2.40 cm Ao Asc diam:  2.70 cm Ao Arch diam: 3.0 cm MITRAL VALVE                TRICUSPID VALVE MV Area (PHT): 2.62 cm     TR Peak grad:   34.1 mmHg MV Mean grad:  6.0 mmHg     TR Vmax:        292.00 cm/s MV Decel Time: 290 msec MV E velocity: 184.00 cm/s  SHUNTS MV A velocity: 133.00 cm/s  Systemic VTI:  0.40 m MV E/A ratio:  1.38         Systemic Diam: 1.60 cm Dina Rich MD Electronically signed by Dina Rich MD Signature Date/Time: 02/17/2023/2:51:58 PM    Final    DG Chest 2 View  Result Date: 02/17/2023 CLINICAL DATA:  Chest pain EXAM: CHEST - 2 VIEW COMPARISON:  Chest x-ray May 15, 2022 FINDINGS: The patient is status post TAVR. Stable cardiomegaly. The hila and mediastinum are unchanged. No pneumothorax. No nodules or masses. No focal infiltrates. No overt edema. The patient is status post vertebroplasties at 2 levels. No acute bony changes. Possible tiny pleural effusions. IMPRESSION: 1. Possible tiny pleural effusions. No other acute abnormalities. 2. Stable cardiomegaly. Electronically Signed   By: Gerome Sam III M.D.   On: 02/17/2023 09:41    Procedures Procedures    Medications Ordered in ED Medications  fentaNYL (SUBLIMAZE) injection 50 mcg (50 mcg Intravenous Given 02/17/23 1011)  LORazepam (ATIVAN) injection 1 mg (1 mg Intravenous Given 02/17/23 1220)  fentaNYL (SUBLIMAZE) injection 50 mcg (50 mcg Intravenous Given 02/17/23 1220)    ED Course/ Medical Decision Making/ A&P Clinical Course as of 02/17/23 1521  Sat Feb 17, 2023  1513 Stable 69 YOF with a chief complaint of chest pain Follows with [facility]  HX  of MM on chemo Cards recommended medicine admit and plan to admit. Hyponatremia as well  [CC]    Clinical Course User Index [CC] Zoe Ade, MD                             Medical Decision Making Amount and/or Complexity of Data Reviewed Labs: ordered. Radiology: ordered.  Risk Prescription drug management. Decision regarding hospitalization.    CLARISSIA MCKEEN is a 89 y.o. female with a past medical history significant for breast cancer, aortic stenosis status post TAVR, CHF, multiple myeloma on chemotherapy, thyroid disease, hypertension, kidney stones, GERD who presents with chest pain.  According to patient, she had chest pain starting about 10 PM last night and this has been persistent through today.  It stabs in severity in her central chest.  She reports no significant shortness of breath and does not seem to be exertional or pleuritic.  She denies history of blood clots in her legs or lungs.  Denies any new leg pain and reports her leg swelling has improved after the Lasix.  She reports they are making some changes to her medications due to an allergic reaction and rash that has been in her lower extremities and torso that has improved.  She reports the rash  is nearly gone.  She denies any new fevers, chills, congestion, cough.  Denies nausea, vomiting, constipation, diarrhea, or urinary changes.  Patient primarily just has the significant chest pain and was making sure she did not have a heart attack.  She denies radiation and has not had this type of pain before.  She reports she thought initially was reflux but that did not improve.  On exam, lungs clear.  Chest was nontender I cannot reduce discomfort.  Abdomen nontender.  Patient has very subtle murmur.  Good pulses in extremities.  Legs are nontender and minimally edematous.  Patient has very fine rash that is difficult to see that she reports is present.  Patient otherwise resting but still complaining of 10 out of 10  chest pain.  Patient reports she is Dr. Jacinto Halim with cardiology.  Due to her denied 10 chest pressure, we will get cardiac workup initiated with labs, chest x-ray, EKG, and other workup.  With her lack of tachycardia, tachypnea, hypoxia, short of breath, unilateral swelling, or history of blood clots, have less suspicion for thromboembolic etiology but as she has multi myeloma, it is considered.  Will get D-dimer instead of going straight to CT PE study initially.  Will get workup and then likely touch base with Dr. Jacinto Halim to discuss plan.   12:10 PM Patient is now complained that her abdomen feels distended and is having pain.  Will get CT abdomen pelvis.  She is also very anxious and will give some anxiety medicine and to more pain medicine.  12:44 PM Just spoke to cardiology team and they would like her started on a heparin drip, nitro drip, and admitted to medicine given her complex other problems and they will see in consultation for likely heart catheterization.  12:52 PM Will await CT abdomen/pelvis to results to rule out obstruction and then will call medicine for admission.  3:21 PM CT abdomen pelvis do not show evidence of acute abnormality causing her discomfort.  She remains on a heparin and nitro drips and we will call medicine for admission for further workup of suspected NSTEMI in the setting of her other medical problems.         Final Clinical Impression(s) / ED Diagnoses Final diagnoses:  Chest pain, unspecified type    Clinical Impression: 1. Chest pain, unspecified type     Disposition: Admit  This note was prepared with assistance of Dragon voice recognition software. Occasional wrong-word or sound-a-like substitutions may have occurred due to the inherent limitations of voice recognition software.      Monique Gift, Canary Brim, MD 02/17/23 [id number]

## 2023-02-17 NOTE — H&P (Signed)
History and Physical   Zoe [name] WJX:[id number] DOB: 03/08/1934 DOA: 02/17/2023  PCP: [facility]   Patient coming from: Home  Chief Complaint: Chest pain  HPI: Zoe [name] is a 89 y.o. female with medical history significant of diastolic CHF, GERD, hypothyroidism, anemia, hyperlipidemia, hypertension, aortic stenosis status post TAVR, neuropathy, multiple myeloma, breast cancer presenting with chest pain.  Patient reported onset of chest pain around 10 PM last night.  Pain currently 10 out of 10 pressure-like sensation at the center of her chest that is nonradiating.  No associated shortness of breath, pleuritic pain, nausea, diaphoresis.  Was seen at oncology earlier this month and had a rash thought to be due to one of her chemotherapy medications no improvement with triamcinolone.  At that visit also noted to have low blood pressure and was told to hold her Lasix x 3 days and given a 500 cc bolus as well as hold her losartan; she held these for 3 days as instructed and resumed them after.  She is also reporting some sensation of abdominal distention.  She denies fevers, chills, shortness of breath, abdominal pain, constipation, diarrhea, nausea, vomiting.  ED Course: Vital signs in the ED notable for blood pressure in the 120s to 170s, respiratory rate in the teens to 20s.  Lab workup included CMP with sodium 125, chloride 92, glucose 108, calcium 8.5, protein 5.8, albumin 3.1.  CBC within normal limits.  Troponin flat at 24 then 23 on repeat.  BNP mildly elevated at 454.  D-dimer negative.  Lipase negative.  TSH normal.  Lactic acid normal with repeat pending.  Urinalysis with glucose, ketones, leukocytes, rare bacteria.  Chest x-ray showed questionable pleural effusions and stable cardiomegaly.  CT abdomen pelvis was negative for acute abnormality but did show a 9 mm right pulmonary nodule.  As well as small pleural effusions.  Cardiology consulted and  recommended heparin drip, nitro drip, echocardiogram and will see for formal consult morning.  Review of Systems: As per HPI otherwise all other systems reviewed and are negative.  Past Medical History:  Diagnosis Date   Arthritis    some - per patient   Breast cancer (HCC)    breast cancer / left    Cataract    bilat    GERD (gastroesophageal reflux disease)    History of kidney stones    Hyperlipidemia    Hypertension    Hypothyroidism    Macular degeneration    Left   S/P TAVR (transcatheter aortic valve replacement) 09/03/2018   23 mm Edwards Sapien 3 transcatheter heart valve placed via percutaneous right transfemoral approach    Severe aortic stenosis    Stress incontinence    Thyroid disease    Tinnitus     Past Surgical History:  Procedure Laterality Date   ABDOMINAL HYSTERECTOMY  1970's   BACK SURGERY     BREAST LUMPECTOMY  12/1998   lumpectomy   CARDIAC CATHETERIZATION     EYE SURGERY     cataract surgery bilat    INTRAOPERATIVE TRANSTHORACIC ECHOCARDIOGRAM N/A 09/03/2018   Procedure: INTRAOPERATIVE TRANSTHORACIC ECHOCARDIOGRAM;  Surgeon: Kathleene Hazel, MD;  Location: [facility] OR;  Service: Open Heart Surgery;  Laterality: N/A;   KYPHOPLASTY N/A 09/07/2022   Procedure: THORACIC EIGHT KYPHOPLASTY;  Surgeon: Venita Lick, MD;  Location: [facility] OR;  Service: Orthopedics;  Laterality: N/A;  1 hr Local with IV [facility] 3 C-Bed   LITHOTRIPSY     Right total knee  2018 Dr. Charlann Boxer   RIGHT/LEFT HEART CATH AND CORONARY ANGIOGRAPHY N/A 08/06/2018   Procedure: RIGHT/LEFT HEART CATH AND CORONARY ANGIOGRAPHY;  Surgeon: Yates Decamp, MD;  Location: [facility]AS[facility];  Service: Cardiovascular;  Laterality: N/A;   THYROIDECTOMY, PARTIAL  1975   TONSILLECTOMY     as a child - patient not sure of exact date   TOTAL KNEE ARTHROPLASTY Left 03/13/2016   Procedure: TOTAL KNEE ARTHROPLASTY;  Surgeon: Durene Romans, MD;  Location: [facility];  Service: Orthopedics;  Laterality: Left;    TOTAL KNEE ARTHROPLASTY Right 06/18/2017   Procedure: RIGHT TOTAL KNEE ARTHROPLASTY;  Surgeon: Durene Romans, MD;  Location: [facility];  Service: Orthopedics;  Laterality: Right;   TRANSCATHETER AORTIC VALVE REPLACEMENT, TRANSFEMORAL N/A 09/03/2018   Procedure: TRANSCATHETER AORTIC VALVE REPLACEMENT, TRANSFEMORAL;  Surgeon: Kathleene Hazel, MD;  Location: [facility] OR;  Service: Open Heart Surgery;  Laterality: N/A;    Social History  reports that she has never smoked. She has never used smokeless tobacco. She reports that she does not drink alcohol and does not use drugs.  Allergies  Allergen Reactions   Penicillins Other (See Comments)    UNSPECIFIED REACTION  Patient does not remember reaction.  Has patient had a PCN reaction causing immediate rash, facial/tongue/throat swelling, SOB or lightheadedness with hypotension: no Has patient had a PCN reaction causing severe rash involving mucus membranes or skin necrosis: no Has patient had a PCN reaction that required hospitalization no Has patient had a PCN reaction occurring within the last 10 years: no If all of the above answers are "NO", then may proceed with Cephalosporin use.    Sulfa Antibiotics Other (See Comments)    UNSPECIFIED REACTION  "maybe vision issues? "    Family History  Problem Relation Age of Onset   Diabetes Mother    Stroke Mother        Carotid artery disease   Heart disease Father        CAD   Coronary artery disease Father    Diabetes Sister   Reviewed on admission  Prior to Admission medications   Medication Sig Start Date End Date Taking? Authorizing Provider  acyclovir (ZOVIRAX) 400 MG tablet Take 400 mg by mouth 2 (two) times daily. Pt takes 1 tablet (400 mg total) by mouth 2 (two) times daily.    Jaci Standard, MD  Artificial Tear Solution (SOOTHE XP) SOLN Place 1 drop into both eyes every evening.    [provider]  aspirin EC 81 MG tablet Take 81 mg by mouth at bedtime. Swallow  whole.    [provider]  calcitonin, salmon, (MIACALCIN/FORTICAL) 200 UNIT/ACT nasal spray Place 1 spray into alternate nostrils daily.    [provider]  carvedilol (COREG) 12.5 MG tablet Take 12.5 mg by mouth 2 (two) times daily with a meal.    [provider]  Cholecalciferol (VITAMIN D3) 50 MCG (2000 UT) capsule Take 1 capsule (2,000 Units total) by mouth daily. 09/11/22   Jaci Standard, MD  [employer] VITAMIN B12 1000 MCG tablet TAKE 1 TABLET BY MOUTH EVERY DAY 02/15/23   [name] T, PA-C  dapagliflozin propanediol (FARXIGA) 10 MG TABS tablet TAKE 1 TABLET BY MOUTH EVERY DAY 10/03/22   Yates Decamp, MD  dexamethasone (DECADRON) 4 MG tablet Take 5 tablets (20 mg total) by mouth once a week. Take 20 mg (5 tablets) on day of multiple myeloma treatment 10/27/22   Jaci Standard, MD  esomeprazole (NEXIUM) 20 MG capsule Take 20 mg by mouth daily as needed (Heartburn). 12/06/21   [provider]  ferrous sulfate 325 (65 FE) MG tablet Take 325 mg by mouth daily with breakfast. Patient not taking: Reported on 01/22/2023    [provider]  furosemide (LASIX) 40 MG tablet Take 1 tablet (40 mg total) by mouth daily. 12/26/22   Yates Decamp, MD  gabapentin (NEURONTIN) 300 MG capsule Take 3 capsules (900 mg total) by mouth 2 (two) times daily. 01/10/23   Jaci Standard, MD  hydrocortisone cream 1 % Apply 1 Application topically 2 (two) times daily. 02/13/23   Jaci Standard, MD  lenalidomide (REVLIMID) 25 MG capsule Take 1 capsule (25 mg total) by mouth daily. Take for 14 days,then none for 7 days.Repeat every 21 days. Auth # [id number]  Date obtained 02/08/23 02/08/23   Jaci Standard, MD  levothyroxine (SYNTHROID, LEVOTHROID) 100 MCG tablet Take 100 mcg by mouth daily before breakfast. 12/29/15   [provider]  losartan (COZAAR) 25 MG tablet TAKE 1 TABLET (25 MG TOTAL) BY MOUTH DAILY. 11/27/22   Yates Decamp, MD  methylPREDNISolone (MEDROL DOSEPAK) 4 MG TBPK  tablet Take 6 pills by mouth day 1, 5 on day 2, 4 on day 3, 3 on day 4, 2 on day 5, 1 on day 6 01/17/23   Walisiewicz, Kaitlyn E, PA-C  ondansetron (ZOFRAN) 8 MG tablet Take 1 tablet (8 mg total) by mouth every 8 (eight) hours as needed. 07/24/22   Jaci Standard, MD  polyethylene glycol (MIRALAX / GLYCOLAX) 17 g packet Take 17 g by mouth daily as needed for mild constipation.    [provider]  potassium chloride (KLOR-CON M10) 10 MEQ tablet Take 2 tablets (20 mEq total) by mouth 2 (two) times daily. 12/26/22   Yates Decamp, MD  pravastatin (PRAVACHOL) 40 MG tablet Take 40 mg by mouth every evening.    [provider]  prochlorperazine (COMPAZINE) 10 MG tablet Take 1 tablet (10 mg total) by mouth every 6 (six) hours as needed for nausea or vomiting. 01/01/23   [name] T, PA-C  triamcinolone ointment (KENALOG) 0.5 % Apply 1 Application topically 2 (two) times daily. 01/22/23   Jaci Standard, MD    Physical Exam: Vitals:   02/17/23 1530 02/17/23 1616 02/17/23 1630 02/17/23 1700  BP: (!) 171/82  (!) 126/107 (!) 110/98  Pulse: 81  85 83  Resp: 18  (!) 22 (!) 21  Temp:  97.7 F (36.5 C)    TempSrc:  Oral    SpO2: 95%  96% 96%  Weight:      Height:        Physical Exam Constitutional:      General: She is not in acute distress.    Appearance: Normal appearance. She is obese.  HENT:     Head: Normocephalic and atraumatic.     Mouth/Throat:     Mouth: Mucous membranes are moist.     Pharynx: Oropharynx is clear.  Eyes:     Extraocular Movements: Extraocular movements intact.     Pupils: Pupils are equal, round, and reactive to light.  Cardiovascular:     Rate and Rhythm: Normal rate and regular rhythm.     Pulses: Normal pulses.     Heart sounds: Normal heart sounds.  Pulmonary:     Effort: Pulmonary effort is normal. No respiratory distress.     Breath sounds: Normal  breath sounds.  Abdominal:     General: Bowel sounds are normal. There is no distension.      Palpations: Abdomen is soft.     Tenderness: There is no abdominal tenderness.  Musculoskeletal:        General: No swelling or deformity.     Right lower leg: Edema (minimal) present.     Left lower leg: Edema (minimal) present.  Skin:    General: Skin is warm and dry.  Neurological:     General: No focal deficit present.     Mental Status: Mental status is at baseline.    Labs on Admission: I have personally reviewed following labs and imaging studies  CBC: Recent Labs  Lab 02/12/23 0823 02/17/23 0922  WBC 7.8 8.9  NEUTROABS 6.0 6.4  HGB 12.0 12.1  HCT 35.3* 35.3*  MCV 86.5 87.4  PLT 147* 278    Basic Metabolic Panel: Recent Labs  Lab 02/12/23 0823 02/17/23 0922  NA 134* 125*  K 3.0* 4.0  CL 96* 92*  CO2 30 23  GLUCOSE 127* 108*  BUN 13 11  CREATININE 0.62 0.70  CALCIUM 8.5* 8.5*    GFR: Estimated Creatinine Clearance: 47.3 mL/min (by C-G formula based on SCr of 0.7 mg/dL).  Liver Function Tests: Recent Labs  Lab 02/12/23 0823 02/17/23 0922  AST 13* 17  ALT 13 16  ALKPHOS 63 64  BILITOT 1.4* 1.2  PROT 5.8* 5.8*  ALBUMIN 3.4* 3.1*    Urine analysis:    Component Value Date/Time   COLORURINE YELLOW 02/17/2023 1448   APPEARANCEUR CLOUDY (A) 02/17/2023 1448   LABSPEC 1.010 02/17/2023 1448   PHURINE 6.0 02/17/2023 1448   GLUCOSEU >=500 (A) 02/17/2023 1448   HGBUR NEGATIVE 02/17/2023 1448   BILIRUBINUR NEGATIVE 02/17/2023 1448   KETONESUR 5 (A) 02/17/2023 1448   PROTEINUR NEGATIVE 02/17/2023 1448   NITRITE NEGATIVE 02/17/2023 1448   LEUKOCYTESUR LARGE (A) 02/17/2023 1448    Radiological Exams on Admission: CT ABDOMEN PELVIS WO CONTRAST  Result Date: 02/17/2023 CLINICAL DATA:  Abdominal pain, acute, nonlocalized abd pain, distension, EXAM: CT ABDOMEN AND PELVIS WITHOUT CONTRAST TECHNIQUE: Multidetector CT imaging of the abdomen and pelvis was performed following the standard protocol without IV contrast. RADIATION DOSE REDUCTION: This exam  was performed according to the departmental dose-optimization program which includes automated exposure control, adjustment of the mA and/or kV according to patient size and/or use of iterative reconstruction technique. COMPARISON:  MR 09/23/2022 and previous FINDINGS: Lower chest: Post TAVR. Small pleural effusions. Dependent atelectasis posteriorly in both lower lobes. 9 mm nodule in the posterior right lower lobe (Im5,Se4) , new since 08/15/2018. Hepatobiliary: 6.7 cm low-attenuation lesion in hepatic segment 8 corresponding to hemangioma on MR 09/23/2022. No new liver lesion or biliary ductal dilatation. Gallbladder incompletely distended. No calcified gallstones. Pancreas: Unremarkable. No pancreatic ductal dilatation or surrounding inflammatory changes. Spleen: Normal in size without focal abnormality. Adrenals/Urinary Tract: No adrenal mass. Symmetric renal contours without urolithiasis. No hydronephrosis. Urinary bladder partially distended. Stomach/Bowel: Stomach is nondistended, unremarkable. Small bowel decompressed. Calcified appendicoliths without appendiceal wall thickening or regional inflammatory change. The colon is partially distended by gas and fecal material, without acute finding. Vascular/Lymphatic: Coarse aortoiliac calcified atheromatous plaque without aneurysm. No abdominal or pelvic adenopathy. Reproductive: Status post hysterectomy. No adnexal masses. Other: Bilateral pelvic phleboliths.  No ascites.  No free air. Musculoskeletal: Post vertebral augmentation T8 and T12. Multilevel lumbar spondylitic change with grade 1 anterolisthesis L4-5, presumably related to advanced facet DJD.  No fracture or worrisome bone lesion. IMPRESSION: 1. No acute findings. 2. 9 mm right lower lobe pulmonary nodule. Per Fleischner Society Guidelines, recommend prompt non-contrast Chest CT for further evaluation. These guidelines do not apply to immunocompromised patients and patients with cancer. Follow up in  patients with significant comorbidities as clinically warranted. For lung cancer screening, adhere to Lung-RADS guidelines. Reference: Radiology. 2017; 284(1):228-43. 3. Aortic Atherosclerosis (ICD10-I70.0). Electronically Signed   By: Corlis Leak M.D.   On: 02/17/2023 14:56   ECHOCARDIOGRAM COMPLETE  Result Date: 02/17/2023    ECHOCARDIOGRAM REPORT   Patient Name:   DELCIA SPITZLEY Date of Exam: 02/17/2023 Medical Rec #:  [id number]           Height:       63.0 in Accession #:    [id number]          Weight:       160.0 lb Date of Birth:  Aug 24, 1934            BSA:          1.759 m Patient Age:    89 years            BP:           162/76 mmHg Patient Gender: F                   HR:           76 bpm. Exam Location:  Inpatient Procedure: 2D Echo, Cardiac Doppler, Color Doppler and Strain Analysis Indications:    R07.9* Chest pain, unspecified  History:        Patient has prior history of Echocardiogram examinations, most                 recent 04/03/2022. CHF, Multiple, Aortic Valve Disease; Risk                 Factors:Hypertension, Dyslipidemia and Non-Smoker.  Sonographer:    Dondra Prader RVT RCS Referring Phys: [id number] [facility] CUSTOVIC  Sonographer Comments: Global longitudinal strain was attempted. IMPRESSIONS  1. Left ventricular ejection fraction, by estimation, is 55 to 60%. The left ventricle has normal function. The left ventricle has no regional wall motion abnormalities. There is mild left ventricular hypertrophy. Left ventricular diastolic parameters are consistent with Grade II diastolic dysfunction (pseudonormalization). Elevated left atrial pressure.  2. Right ventricular systolic function is normal. The right ventricular size is normal. There is mildly elevated pulmonary artery systolic pressure.  3. Left atrial size was severely dilated.  4. The mitral valve is abnormal. Mild mitral valve regurgitation. Moderate mitral stenosis.  5. The tricuspid valve is abnormal.  6. Edwards Sapien 3 THV size 23  mm is in the AV position. . The aortic valve has been repaired/replaced. Aortic valve regurgitation is not visualized. No aortic stenosis is present.  7. The inferior vena cava is dilated in size with >50% respiratory variability, suggesting right atrial pressure of 8 mmHg. FINDINGS  Left Ventricle: Left ventricular ejection fraction, by estimation, is 55 to 60%. The left ventricle has normal function. The left ventricle has no regional wall motion abnormalities. Global longitudinal strain performed but not reported based on interpreter judgement due to suboptimal tracking. The left ventricular internal cavity size was normal in size. There is mild left ventricular hypertrophy. Left ventricular diastolic parameters are consistent with Grade II diastolic dysfunction (pseudonormalization). Elevated left atrial pressure. Right Ventricle: The right ventricular size is normal. Right vetricular wall thickness was  not well visualized. Right ventricular systolic function is normal. There is mildly elevated pulmonary artery systolic pressure. The tricuspid regurgitant velocity  is 2.92 m/s, and with an assumed right atrial pressure of 8 mmHg, the estimated right ventricular systolic pressure is 42.1 mmHg. Left Atrium: Left atrial size was severely dilated. Right Atrium: Right atrial size was normal in size. Pericardium: There is no evidence of pericardial effusion. Mitral Valve: The mitral valve is abnormal. There is mild thickening of the mitral valve leaflet(s). There is mild calcification of the mitral valve leaflet(s). Mild mitral annular calcification. Mild mitral valve regurgitation. Moderate mitral valve stenosis. The mean mitral valve gradient is 6.0 mmHg. Tricuspid Valve: The tricuspid valve is abnormal. Tricuspid valve regurgitation is mild . No evidence of tricuspid stenosis. Aortic Valve: Edwards Sapien 3 THV size 23 mm is in the AV position. The aortic valve has been repaired/replaced. Aortic valve regurgitation  is not visualized. No aortic stenosis is present. Aortic valve mean gradient measures 10.0 mmHg. Aortic valve peak gradient measures 17.5 mmHg. Aortic valve area, by VTI measures 2.30 cm. Pulmonic Valve: The pulmonic valve was not well visualized. Pulmonic valve regurgitation is mild. No evidence of pulmonic stenosis. Aorta: The aortic root is normal in size and structure. Venous: The inferior vena cava is dilated in size with greater than 50% respiratory variability, suggesting right atrial pressure of 8 mmHg. IAS/Shunts: No atrial level shunt detected by color flow Doppler.  LEFT VENTRICLE PLAX 2D LVIDd:         3.80 cm   Diastology LVIDs:         2.70 cm   LV e' medial:    5.52 cm/s LV PW:         1.20 cm   LV E/e' medial:  33.3 LV IVS:        1.20 cm   LV e' lateral:   5.56 cm/s LVOT diam:     1.60 cm   LV E/e' lateral: 33.1 LV SV:         81 LV SV Index:   46 LVOT Area:     2.01 cm  RIGHT VENTRICLE             IVC RV Basal diam:  4.00 cm     IVC diam: 2.30 cm RV Mid diam:    3.10 cm RV S prime:     13.30 cm/s TAPSE (M-mode): 2.5 cm LEFT ATRIUM             Index        RIGHT ATRIUM           Index LA diam:        4.20 cm 2.39 cm/m   RA Area:     16.60 cm LA Vol (A2C):   93.4 ml 53.11 ml/m  RA Volume:   38.50 ml  21.89 ml/m LA Vol (A4C):   88.6 ml 50.38 ml/m LA Biplane Vol: 92.3 ml 52.48 ml/m  AORTIC VALVE                     PULMONIC VALVE AV Area (Vmax):    1.80 cm      PV Vmax:       0.88 m/s AV Area (Vmean):   1.84 cm      PV Peak grad:  3.1 mmHg AV Area (VTI):     2.30 cm AV Vmax:           209.00 cm/s AV Vmean:  143.000 cm/s AV VTI:            0.351 m AV Peak Grad:      17.5 mmHg AV Mean Grad:      10.0 mmHg LVOT Vmax:         187.00 cm/s LVOT Vmean:        131.000 cm/s LVOT VTI:          0.401 m LVOT/AV VTI ratio: 1.14 AR Vena Contracta: 0.30 cm  AORTA Ao Root diam: 2.40 cm Ao Asc diam:  2.70 cm Ao Arch diam: 3.0 cm MITRAL VALVE                TRICUSPID VALVE MV Area (PHT): 2.62 cm      TR Peak grad:   34.1 mmHg MV Mean grad:  6.0 mmHg     TR Vmax:        292.00 cm/s MV Decel Time: 290 msec MV E velocity: 184.00 cm/s  SHUNTS MV A velocity: 133.00 cm/s  Systemic VTI:  0.40 m MV E/A ratio:  1.38         Systemic Diam: 1.60 cm Dina Rich MD Electronically signed by Dina Rich MD Signature Date/Time: 02/17/2023/2:51:58 PM    Final    DG Chest 2 View  Result Date: 02/17/2023 CLINICAL DATA:  Chest pain EXAM: CHEST - 2 VIEW COMPARISON:  Chest x-ray May 15, 2022 FINDINGS: The patient is status post TAVR. Stable cardiomegaly. The hila and mediastinum are unchanged. No pneumothorax. No nodules or masses. No focal infiltrates. No overt edema. The patient is status post vertebroplasties at 2 levels. No acute bony changes. Possible tiny pleural effusions. IMPRESSION: 1. Possible tiny pleural effusions. No other acute abnormalities. 2. Stable cardiomegaly. Electronically Signed   By: Gerome Sam III M.D.   On: 02/17/2023 09:41    EKG: Independently reviewed.  Sinus rhythm at 70 bpm.  Minimal baseline artifact.  Assessment/Plan Principal Problem:   Chest pain, rule out acute myocardial infarction Active Problems:   Hyponatremia   Microcytic anemia   S/P TAVR (transcatheter aortic valve replacement)   Hypothyroidism   Hypertension   Hyperlipidemia   GERD (gastroesophageal reflux disease)   Multiple myeloma not having achieved remission (HCC)   Chest pain, rule out MI > Presenting with 10 out of 10 chest pain with a pressure-like sensation in the central chest that is nonradiating.  No significant associated symptoms. > Follows with cardiology who are consulted recommending heparin drip, nitro drip, echocardiogram and will see for formal consult in the morning. > Troponin currently flat at 24 and then 23 on repeat. - Monitor in progressive unit with concurrent hyponatremia as below - Continue with heparin drip and nitro drip per cardiology - Echocardiogram - Repeat troponin  and EKG if recurrent/worsening chest pain or other symptoms - Supportive care  Hyponatremia > Incidentally noted to have sodium of 125-in the ED.  Baseline sodium is in the 130s but has had issues with hyponatremia in the past down to the 120s. > Previously has responded to diuresis.  Does not appear volume down and indeed appears mildly volume overloaded and CT scan noted small pleural effusions.  BNP 454. > Did hold Lasix on recommendation of oncologist after hypotension noted on 4/15.  Told to hold Lasix for 3 days as well as losartan and received a 500 cc bolus at that time. She has since resumed her medications. > Received a dose of Lasix in the ED, will monitor response of  this initial dose and hold off on further intervention for now. - Monitor in progressive unit - Repeat BMP now and every 4 hours - Add on serum osmolality, urine osmolality, urine sodium  Multiple myeloma History of breast cancer Neuropathy associated with chemotherapy  > Follows with oncology - Continue home lenalidomide through early May (for 14 days since start on 4/18) - Continue prophylactic acyclovir - Continue home gabapentin  Diastolic CHF AS s/p TAVR > Last echo in 2023 with EF 60-65%, G2 DD, normal RV function.  Getting repeat echo due to chest pain as above.  BNP mildly elevated to 454 as above. > Did hold Lasix on recommendation of oncologist after hypotension noted on 4/15.  Told to hold Lasix for 3 days as well as losartan and received a 500 cc bolus at that time. She has since resumed her medications. - Monitor response to Lasix with regard to hyponatremia as above, consider repeat dosing based on this - Continue home carvedilol and losartan  Hypertension - Continue home carvedilol, losartan - Receiving Lasix as above  Hyperlipidemia - Continue home pravastatin  Hypothyroidism - Continue home Synthroid    Anemia > Hemoglobin currently normal - Trend CBC  GERD - Continue home PPI  DVT  prophylaxis: Heparin Code Status:   Full Family Communication:  Updated at bedside Disposition Plan:   Patient is from:  Home  Anticipated DC to:  Home  Anticipated DC date:  2-4 days  Anticipated DC barriers: None  Consults called:  Cardiology consulted in the ED Admission status:  Inpatient, progressive  Severity of Illness: The appropriate patient status for this patient is INPATIENT. Inpatient status is judged to be reasonable and necessary in order to provide the required intensity of service to ensure the patient's safety. The patient's presenting symptoms, physical exam findings, and initial radiographic and laboratory data in the context of their chronic comorbidities is felt to place them at high risk for further clinical deterioration. Furthermore, it is not anticipated that the patient will be medically stable for discharge from the hospital within 2 midnights of admission.   * I certify that at the point of admission it is my clinical judgment that the patient will require inpatient hospital care spanning beyond 2 midnights from the point of admission due to high intensity of service, high risk for further deterioration and high frequency of surveillance required.Synetta Fail MD [facility]  How to contact the [facility] Attending or Consulting provider 7A - 7P or covering provider during after hours 7P -7A, for this patient?   Check the care team in [facility] and look for a) attending/consulting [facility] provider listed and b) the [facility] team listed Log into [email or web address] and use [facility] universal password to access. If you do not have the password, please contact the hospital operator. Locate the [facility] provider you are looking for under [facility] and page to a number that you can be directly reached. If you still have difficulty reaching the provider, please page the [facility] (Director on Call) for the Hospitalists listed on amion for assistance.  02/17/2023, 5:21 PM

## 2023-02-17 NOTE — Progress Notes (Signed)
ANTICOAGULATION CONSULT NOTE - Initial Consult  Pharmacy Consult for Heparin Indication:  unstable angina  Allergies  Allergen Reactions   Penicillins Other (See Comments)    UNSPECIFIED REACTION  Patient does not remember reaction.  Has patient had a PCN reaction causing immediate rash, facial/tongue/throat swelling, SOB or lightheadedness with hypotension: no Has patient had a PCN reaction causing severe rash involving mucus membranes or skin necrosis: no Has patient had a PCN reaction that required hospitalization no Has patient had a PCN reaction occurring within the last 10 years: no If all of the above answers are "NO", then may proceed with Cephalosporin use.    Sulfa Antibiotics Other (See Comments)    UNSPECIFIED REACTION  "maybe vision issues? "    Patient Measurements: Height: 5\' 3"  (160 cm) Weight: 72.6 kg (160 lb) IBW/kg (Calculated) : 52.4 Heparin Dosing Weight: 67.6 kg  Vital Signs: Temp: 97.5 F (36.4 C) (04/27 0913) Temp Source: Temporal (04/27 0913) BP: 162/76 (04/27 1233) Pulse Rate: 74 (04/27 1233)  Labs: Recent Labs    02/17/23 0922 02/17/23 1115  HGB 12.1  --   HCT 35.3*  --   PLT 278  --   CREATININE 0.70  --   TROPONINIHS 24* 23*    Estimated Creatinine Clearance: 47.3 mL/min (by C-G formula based on SCr of 0.7 mg/dL).   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 Patient is presenting with chest pain. Heparin per pharmacy consult placed for  unstable angina .  Patient is not on anticoagulation prior to arrival.  Hgb 12.1; plt 278  Goal of Therapy:  Heparin level 0.3-0.7 units/ml Monitor platelets by anticoagulation protocol: Yes   Plan:  Give IV heparin 4000 units bolus x 1 Start heparin infusion at 800 units/hr Check anti-Xa level in 8 hours and daily while on heparin Continue to monitor H&H and platelets  Delmar Landau, PharmD, BCPS 02/17/2023 1:08 PM ED Clinical Pharmacist -  [phone]

## 2023-02-18 ENCOUNTER — Encounter: Payer: Self-pay | Admitting: Hematology and Oncology

## 2023-02-18 ENCOUNTER — Other Ambulatory Visit: Payer: Self-pay | Admitting: Hematology and Oncology

## 2023-02-18 DIAGNOSIS — E7800 Pure hypercholesterolemia, unspecified: Secondary | ICD-10-CM | POA: Diagnosis not present

## 2023-02-18 DIAGNOSIS — C9000 Multiple myeloma not having achieved remission: Secondary | ICD-10-CM

## 2023-02-18 DIAGNOSIS — I110 Hypertensive heart disease with heart failure: Secondary | ICD-10-CM | POA: Diagnosis not present

## 2023-02-18 DIAGNOSIS — I4891 Unspecified atrial fibrillation: Secondary | ICD-10-CM | POA: Diagnosis not present

## 2023-02-18 DIAGNOSIS — I5032 Chronic diastolic (congestive) heart failure: Secondary | ICD-10-CM | POA: Diagnosis not present

## 2023-02-18 DIAGNOSIS — Z952 Presence of prosthetic heart valve: Secondary | ICD-10-CM | POA: Diagnosis not present

## 2023-02-18 DIAGNOSIS — I6523 Occlusion and stenosis of bilateral carotid arteries: Secondary | ICD-10-CM | POA: Diagnosis not present

## 2023-02-18 LAB — BASIC METABOLIC PANEL
Anion gap: 10 (ref 5–15)
Anion gap: 13 (ref 5–15)
Anion gap: 10 (ref 5–15)
BUN: 14 mg/dL (ref 8–23)
BUN: 15 mg/dL (ref 8–23)
BUN: 16 mg/dL (ref 8–23)
CO2: 25 mmol/L (ref 22–32)
CO2: 23 mmol/L (ref 22–32)
CO2: 24 mmol/L (ref 22–32)
Calcium: 8.1 mg/dL — ABNORMAL LOW (ref 8.9–10.3)
Calcium: 8.1 mg/dL — ABNORMAL LOW (ref 8.9–10.3)
Calcium: 8.1 mg/dL — ABNORMAL LOW (ref 8.9–10.3)
Chloride: 93 mmol/L — ABNORMAL LOW (ref 98–111)
Chloride: 92 mmol/L — ABNORMAL LOW (ref 98–111)
Chloride: 92 mmol/L — ABNORMAL LOW (ref 98–111)
Creatinine, Ser: 0.73 mg/dL (ref 0.44–1.00)
Creatinine, Ser: 0.76 mg/dL (ref 0.44–1.00)
Creatinine, Ser: 0.79 mg/dL (ref 0.44–1.00)
GFR, Estimated: >60 mL/min (ref >60)
GFR, Estimated: >60 mL/min (ref >60)
GFR, Estimated: >60 mL/min (ref >60)
Glucose, Bld: 123 mg/dL — ABNORMAL HIGH (ref 70–99)
Glucose, Bld: 126 mg/dL — ABNORMAL HIGH (ref 70–99)
Glucose, Bld: 165 mg/dL — ABNORMAL HIGH (ref 70–99)
Potassium: 3.4 mmol/L — ABNORMAL LOW (ref 3.5–5.1)
Potassium: 3.3 mmol/L — ABNORMAL LOW (ref 3.5–5.1)
Potassium: 3.8 mmol/L (ref 3.5–5.1)
Sodium: 128 mmol/L — ABNORMAL LOW (ref 135–145)
Sodium: 128 mmol/L — ABNORMAL LOW (ref 135–145)
Sodium: 126 mmol/L — ABNORMAL LOW (ref 135–145)

## 2023-02-18 LAB — MAGNESIUM: Magnesium: 1.7 mg/dL (ref 1.7–2.4)

## 2023-02-18 LAB — CBC
HCT: 32.3 % — ABNORMAL LOW (ref 36.0–46.0)
Hemoglobin: 11.3 g/dL — ABNORMAL LOW (ref 12.0–15.0)
MCH: 30 pg (ref 26.0–34.0)
MCHC: 35 g/dL (ref 30.0–36.0)
MCV: 85.7 fL (ref 80.0–100.0)
Platelets: 276 10*3/uL (ref 150–400)
RBC: 3.77 MIL/uL — ABNORMAL LOW (ref 3.87–5.11)
RDW: 17.6 % — ABNORMAL HIGH (ref 11.5–15.5)
WBC: 8 10*3/uL (ref 4.0–10.5)
nRBC: 0 % (ref 0.0–0.2)

## 2023-02-18 LAB — HEPARIN LEVEL (UNFRACTIONATED): Heparin Unfractionated: 0.17 IU/mL — ABNORMAL LOW (ref 0.30–0.70)

## 2023-02-18 MED ORDER — METOPROLOL TARTRATE 5 MG/5ML IV SOLN: 5.0000 mg | Freq: Three times a day (TID) | INTRAVENOUS | Status: DC | PRN

## 2023-02-18 MED ORDER — AMIODARONE HCL IN DEXTROSE 360-4.14 MG/200ML-% IV SOLN: 60.0000 mg/h | INTRAVENOUS | Status: DC

## 2023-02-18 MED ORDER — AMIODARONE HCL IN DEXTROSE 360-4.14 MG/200ML-% IV SOLN
30.0000 mg/h | INTRAVENOUS | Status: DC
  Administered 2023-02-18 – 2023-02-19 (×3): 30 mg/h via INTRAVENOUS
  Filled 2023-02-18 (×4): qty 200

## 2023-02-18 MED ORDER — METOPROLOL TARTRATE 5 MG/5ML IV SOLN: 5.0000 mg | Freq: Three times a day (TID) | INTRAVENOUS | Status: DC

## 2023-02-18 MED ORDER — POTASSIUM CHLORIDE CRYS ER 20 MEQ PO TBCR
40.0000 meq | EXTENDED_RELEASE_TABLET | ORAL | Status: AC
  Administered 2023-02-18 (×2): 40 meq via ORAL
  Filled 2023-02-18 (×2): qty 2

## 2023-02-18 MED ORDER — SERTRALINE HCL 25 MG PO TABS
25.0000 mg | ORAL_TABLET | Freq: Every day | ORAL | Status: DC
  Administered 2023-02-18 – 2023-02-19 (×2): 25 mg via ORAL
  Filled 2023-02-18 (×2): qty 1

## 2023-02-18 MED ORDER — MIDODRINE HCL 5 MG PO TABS
10.0000 mg | ORAL_TABLET | Freq: Three times a day (TID) | ORAL | Status: DC
  Administered 2023-02-18 (×3): 10 mg via ORAL
  Filled 2023-02-18 (×4): qty 2

## 2023-02-18 MED ORDER — GABAPENTIN 300 MG PO CAPS
600.0000 mg | ORAL_CAPSULE | Freq: Three times a day (TID) | ORAL | Status: DC
  Administered 2023-02-18 – 2023-02-19 (×5): 600 mg via ORAL
  Filled 2023-02-18 (×5): qty 2

## 2023-02-18 MED ORDER — APIXABAN 5 MG PO TABS
5.0000 mg | ORAL_TABLET | Freq: Two times a day (BID) | ORAL | Status: DC
  Administered 2023-02-18 – 2023-02-19 (×3): 5 mg via ORAL
  Filled 2023-02-18 (×3): qty 1

## 2023-02-18 MED ORDER — METOPROLOL SUCCINATE ER 25 MG PO TB24
12.5000 mg | ORAL_TABLET | Freq: Every day | ORAL | Status: DC
  Administered 2023-02-19: 12.5 mg via ORAL
  Filled 2023-02-18 (×2): qty 1

## 2023-02-18 MED ORDER — AMIODARONE HCL IN DEXTROSE 360-4.14 MG/200ML-% IV SOLN
60.0000 mg/h | INTRAVENOUS | Status: AC
  Administered 2023-02-18 (×2): 60 mg/h via INTRAVENOUS

## 2023-02-18 MED ORDER — AMIODARONE LOAD VIA INFUSION
150.0000 mg | Freq: Once | INTRAVENOUS | Status: AC
  Administered 2023-02-18: 150 mg via INTRAVENOUS
  Filled 2023-02-18: qty 83.34

## 2023-02-18 MED ORDER — AMIODARONE HCL IN DEXTROSE 360-4.14 MG/200ML-% IV SOLN: 30.0000 mg/h | INTRAVENOUS | Status: DC

## 2023-02-18 MED ORDER — SODIUM CHLORIDE 0.9 % IV BOLUS: 250.0000 mL | Freq: Once | INTRAVENOUS | Status: DC

## 2023-02-18 MED ORDER — HEPARIN BOLUS VIA INFUSION
2000.0000 [IU] | Freq: Once | INTRAVENOUS | Status: AC
  Administered 2023-02-18: 2000 [IU] via INTRAVENOUS
  Filled 2023-02-18: qty 2000

## 2023-02-18 MED ORDER — METOPROLOL TARTRATE 5 MG/5ML IV SOLN
5.0000 mg | Freq: Four times a day (QID) | INTRAVENOUS | Status: DC
  Administered 2023-02-18: 5 mg via INTRAVENOUS
  Filled 2023-02-18: qty 5

## 2023-02-18 NOTE — Progress Notes (Signed)
DISCONTINUE ON PATHWAY REGIMEN - Multiple Myeloma and Other Plasma Cell Dyscrasias     A cycle is every 21 days:     Bortezomib      Lenalidomide      Dexamethasone   **Always confirm dose/schedule in your pharmacy ordering system**  REASON: Toxicities / Adverse Event PRIOR TREATMENT: [id number]: VRd (Bortezomib 1.3 mg/m2 SUBQ D1, 8, 15 + Lenalidomide 25 mg + Dexamethasone 40 mg) q21 Days x 8 Cycles TREATMENT RESPONSE: Partial Response (PR)  START ON PATHWAY REGIMEN - Multiple Myeloma and Other Plasma Cell Dyscrasias     Cycles 1 and 2: A cycle is every 28 days:     Lenalidomide      Dexamethasone      Daratumumab and hyaluronidase-fihj    Cycles 3 through 6: A cycle is every 28 days:     Lenalidomide      Dexamethasone      Daratumumab and hyaluronidase-fihj    Cycles 7 and beyond: A cycle is every 28 days:     Lenalidomide      Dexamethasone      Daratumumab and hyaluronidase-fihj   **Always confirm dose/schedule in your pharmacy ordering system**  Patient Characteristics: Multiple Myeloma, Newly Diagnosed, Transplant Ineligible or Refused, Standard Risk Disease Classification: Multiple Myeloma Therapeutic Status: Newly Diagnosed R2-ISS Staging: II Is Patient Eligible for Transplant<= Transplant Ineligible or Refused Risk Status: Standard Risk Intent of Therapy: Curative Intent, Discussed with Patient

## 2023-02-18 NOTE — Progress Notes (Signed)
ANTICOAGULATION CONSULT NOTE - Follow Up Consult  Pharmacy Consult for heparin Indication:  USAP  Labs: Recent Labs    02/17/23 0922 02/17/23 1115 02/17/23 1859 02/17/23 1955 02/18/23 0100 02/18/23 0551  HGB 12.1  --   --   --  11.3*  --   HCT 35.3*  --   --   --  32.3*  --   PLT 278  --   --   --  276  --   HEPARINUNFRC  --   --  <0.10*  --   --  0.17*  CREATININE 0.70  --  0.69 0.70 0.73 0.76  TROPONINIHS 24* 23*  --   --   --   --     Assessment: 89yo female subtherapeutic on heparin after rate change; no infusion issues per RN but she does note that pt lost IV x1h a few hours prior to lab draw.  Goal of Therapy:  Heparin level 0.3-0.7 units/ml   Plan:  Will rebolus with heparin 2000 units, increase heparin infusion by 1-2 units/kg/hr to 1100 units/hr, and check level in 8 hours.    Vernard Gambles, PharmD, BCPS  02/18/2023,6:39 AM

## 2023-02-18 NOTE — Hospital Course (Signed)
PMH of GERD, hypothyroidism, HLD, HTN, severe aortic stenosis SP TAVR, multiple myeloma on actively chemotherapy, breast cancer, chronic diastolic CHF presented to hospital with complaints of shortness of breath and chest pain. Found to have A-fib with RVR as well as minimally elevated troponin.  He is also hypotensive.  Cardiology following.

## 2023-02-18 NOTE — Progress Notes (Addendum)
[facility] Progress Note Patient: Zoe [name] ZOX:[id number] DOB: 1934-02-07 DOA: 02/17/2023  DOS: the patient was seen and examined on 02/18/2023  Brief hospital course: PMH of GERD, hypothyroidism, HLD, HTN, severe aortic stenosis SP TAVR, multiple myeloma on actively chemotherapy, breast cancer, chronic diastolic CHF presented to hospital with complaints of shortness of breath and chest pain. Found to have A-fib with RVR as well as minimally elevated troponin.  He is also hypotensive.  Cardiology following. Assessment and Plan: New onset A-fib with RVR. Cardiology consulted. Heart rate still elevated but improving.  TSH 1.499. For rate control patient was started on amiodarone drip. Cardiology recommending Toprol-XL as well as IV Lopressor. Was started on IV heparin.  Cardiology recommending Eliquis. Echocardiogram shows preserved EF. Will monitor.  Chest pain. Suspect in the setting of RVR. Currently chest pain-free. Troponins are minimally elevated. Echocardiogram shows preserved EF no wall motion abnormality. Management per cardiology.  Chronic diastolic CHF. Patient was given IV Lasix. Currently appears to be euvolemic. Will monitor.  S/p TAVR.  2019. Currently appears to be stable.  Monitor.  Hypotension. Normally has history of hypertension. Patient's blood pressure is significantly low.  Suspect this is in the setting of atrial fibrillation. On manual recheck blood pressure was 110/60 for me this morning which is correlating well with the blood pressure cuff machine. Patient is on midodrine due to hypotension.  Hypoosmolar hyponatremia. Appears to be euvolemic at the time of my evaluation but was thought to be secondary to hypervolemia at the time of admission. Suspect chronically low sodium in the range of 131. Currently asymptomatic.  Continue free water restriction.  Hypokalemia. Replaced.  Abdominal discomfort. CT abdomen was performed in the ER  for her complaint of abdominal discomfort. No acute finding. Tolerating oral diet.  Currently no symptoms.  9 mm right lower lobe pulmonary nodule. Will require repeat noncontrast CT scan for further evaluation once stable. Already has an established received with oncology for multiple myeloma as well as breast cancer.  PAD. Left carotid artery stenosis present. Continue antithrombotic regimen.  HLD. Continue statin.  History of multiple myeloma on an ongoing chemotherapy regimen. History of breast cancer. Follows up with Dr. Leonides Schanz. Currently continuing acyclovir prophylaxis as well as gabapentin. Outpatient follow-up with oncology.  Chronic anemia. Normocytic. H&H stable. Monitor.  Hypothyroidism. TSH normal. No evidence of hyperthyroidism. Continue home Synthroid.  GERD. Will continue PPI.  Chemotherapy-induced neuropathy. On gabapentin. Will continue.   Subjective: Denies any acute complaint.  No nausea or vomiting or no further chest pain.  No shortness of breath.  Somewhat confused and pulling on IVs.  Physical Exam: General: in Mild distress, No Rash Cardiovascular: S1 and S2 Present, No Murmur Respiratory: Good respiratory effort, Bilateral Air entry present. No Crackles, No wheezes Abdomen: Bowel Sound present, No tenderness Extremities: No edema Neuro: Alert and oriented x3, no new focal deficit  Data Reviewed: I have Reviewed nursing notes, Vitals, and Lab results. Since last encounter, pertinent lab results CBC and BMP   . I have ordered test including CBC and BMP  . I have discussed pt's care plan and test results with cardiology  .   Disposition: Status is: Inpatient Remains inpatient appropriate because: Need rate control  apixaban (ELIQUIS) tablet 5 mg   Family Communication: Family at bedside Level of care: Progressive   Vitals:   02/18/23 1100 02/18/23 1200 02/18/23 1300 02/18/23 1400  BP: 92/63 97/69 (!) 131/51 (!) 112/95  Pulse: (!)  110 66 62 72  Resp: (!) 25 (!) 27 (!) 23 20  Temp: 98.6 F (37 C)     TempSrc: Oral     SpO2: 93% 96% 96% 100%  Weight:      Height:        The patient is critically ill with multiple organ systems failure and requires high complexity decision making for assessment and support, frequent evaluation and titration of therapies. Critical Care Time devoted to patient care services described in this note is 35 minutes   Author: Lynden Oxford, MD 02/18/2023 4:11 PM  Please look on [email or web address] to find out who is on call.

## 2023-02-18 NOTE — Progress Notes (Signed)
ANTICOAGULATION CONSULT NOTE - Follow Up Consult  Pharmacy Consult for heparin Indication:  USAP  Labs: Recent Labs    02/17/23 0922 02/17/23 1115 02/17/23 1859 02/17/23 1955 02/18/23 0100 02/18/23 0551 02/18/23 1018  HGB 12.1  --   --   --  11.3*  --   --   HCT 35.3*  --   --   --  32.3*  --   --   PLT 278  --   --   --  276  --   --   HEPARINUNFRC  --   --  <0.10*  --   --  0.17*  --   CREATININE 0.70  --  0.69   < > 0.73 0.76 0.79  TROPONINIHS 24* 23*  --   --   --   --   --    < > = values in this interval not displayed.     Assessment: 89yo female with CP, found to have AF. Pt started on IV heparin, now to switch to apixaban. Age 89 yo, wt 75kg, Cr <1 so 5mg  BID will be the dose.  Goal of Therapy:  Heparin level 0.3-0.7 units/ml   Plan:  Stop heparin Give apixaban 5mg  BID Pharmacy will sign off, reconsult as needed  Fredonia Highland, PharmD, BCPS, [facility] Clinical Pharmacist [phone] Please check AMION for all [facility] Pharmacy numbers 02/18/2023

## 2023-02-18 NOTE — Progress Notes (Signed)
Patient had an order for amiodarone and had a low BP, MD notified.,

## 2023-02-18 NOTE — Plan of Care (Signed)
POC initiated and progressing. 

## 2023-02-18 NOTE — Consult Note (Signed)
CARDIOLOGY CONSULT NOTE  Patient ID: RINOA GARRAMONE MRN: [id number] DOB/AGE: [birth year] 89 y.o.  Admit date: 02/17/2023 Referring Physician  Dr Allena Katz Primary Physician:  [facility] Reason for Consultation  chest pain  Patient ID: [name], female    DOB: [birth year], 89 y.o.   MRN: [id number]  Chief Complaint  Patient presents with   Chest Pain   HPI:    KHALISE BILLARD  is a 89 y.o. female with history of possibly mild coronary spasm by coronary angiogram in 2014,  severe AS s/p TAVR by Dr. Cornelius Moras on 09/03/2018, asymptomatic bilateral carotid artery stenosis,  essential hypertension, hyperlipidemia, hyperglycemia and GERD. She underwent T8 kyphoplasty on 09/07/2022 without any postprocedural cardiac complications.  Patient also going through chemotherapy, presently diagnosed with multiple myeloma.   She presented to the ED with chest pain, abdominal pain, and altered mental status. This morning family believes the patient was having a panic attack yesterday and that was causing her chest pain. This morning she is chest pain free. Her abdominal pain has improved. She has eaten a full breakfast and is feeling much better. Family discussed her anxiety with me and requested I add something to help as she is overly anxious. She denies shortness of breath, palpitations, diaphoresis, syncope.  Past Medical History:  Diagnosis Date   Arthritis    some - per patient   Breast cancer (HCC)    breast cancer / left    Cataract    bilat    GERD (gastroesophageal reflux disease)    History of kidney stones    Hyperlipidemia    Hypertension    Hypothyroidism    Macular degeneration    Left   S/P TAVR (transcatheter aortic valve replacement) 09/03/2018   23 mm Edwards Sapien 3 transcatheter heart valve placed via percutaneous right transfemoral approach    Severe aortic stenosis    Stress incontinence    Thyroid disease    Tinnitus    Past Surgical History:   Procedure Laterality Date   ABDOMINAL HYSTERECTOMY  1970's   BACK SURGERY     BREAST LUMPECTOMY  12/1998   lumpectomy   CARDIAC CATHETERIZATION     EYE SURGERY     cataract surgery bilat    INTRAOPERATIVE TRANSTHORACIC ECHOCARDIOGRAM N/A 09/03/2018   Procedure: INTRAOPERATIVE TRANSTHORACIC ECHOCARDIOGRAM;  Surgeon: Kathleene Hazel, MD;  Location: [facility] OR;  Service: Open Heart Surgery;  Laterality: N/A;   KYPHOPLASTY N/A 09/07/2022   Procedure: THORACIC EIGHT KYPHOPLASTY;  Surgeon: Venita Lick, MD;  Location: [facility] OR;  Service: Orthopedics;  Laterality: N/A;  1 hr Local with IV [facility] 3 C-Bed   LITHOTRIPSY     Right total knee     2018 Dr. Charlann Boxer   RIGHT/LEFT HEART CATH AND CORONARY ANGIOGRAPHY N/A 08/06/2018   Procedure: RIGHT/LEFT HEART CATH AND CORONARY ANGIOGRAPHY;  Surgeon: Yates Decamp, MD;  Location: [facility] INVASIVE CV LAB;  Service: Cardiovascular;  Laterality: N/A;   THYROIDECTOMY, PARTIAL  1975   TONSILLECTOMY     as a child - patient not sure of exact date   TOTAL KNEE ARTHROPLASTY Left 03/13/2016   Procedure: TOTAL KNEE ARTHROPLASTY;  Surgeon: Durene Romans, MD;  Location: [facility] ORS;  Service: Orthopedics;  Laterality: Left;   TOTAL KNEE ARTHROPLASTY Right 06/18/2017   Procedure: RIGHT TOTAL KNEE ARTHROPLASTY;  Surgeon: Durene Romans, MD;  Location: [facility] ORS;  Service: Orthopedics;  Laterality: Right;   TRANSCATHETER AORTIC VALVE REPLACEMENT, TRANSFEMORAL N/A 09/03/2018   Procedure: TRANSCATHETER AORTIC  VALVE REPLACEMENT, TRANSFEMORAL;  Surgeon: Kathleene Hazel, MD;  Location: [facility] OR;  Service: Open Heart Surgery;  Laterality: N/A;   Social History   Tobacco Use   Smoking status: Never   Smokeless tobacco: Never  Substance Use Topics   Alcohol use: No    Family History  Problem Relation Age of Onset   Diabetes Mother    Stroke Mother        Carotid artery disease   Heart disease Father        CAD   Coronary artery disease Father    Diabetes Sister      Marital Status: Widowed  ROS  Review of Systems  Cardiovascular:  Positive for chest pain.  Gastrointestinal:  Positive for abdominal pain.  Psychiatric/Behavioral:  Positive for hypervigilance. The patient is nervous/anxious.    Objective      02/18/2023   11:00 AM 02/18/2023   10:43 AM 02/18/2023    9:30 AM  Vitals with BMI  Systolic  105 109  Diastolic  57 61  Pulse 110 109 135    Blood pressure (!) 105/57, pulse (!) 110, temperature 98.6 F (37 C), temperature source Oral, resp. rate 20, height 5\' 3"  (1.6 m), weight 75.1 kg, SpO2 90 %.    Physical Exam Vitals reviewed.  HENT:     Head: Normocephalic and atraumatic.  Cardiovascular:     Rate and Rhythm: Tachycardia present. Rhythm irregular.     Heart sounds: No murmur heard. Pulmonary:     Effort: Pulmonary effort is normal.     Breath sounds: Normal breath sounds.  Abdominal:     General: Bowel sounds are normal.  Musculoskeletal:     Right lower leg: No edema.     Left lower leg: No edema.  Skin:    General: Skin is warm and dry.  Neurological:     Mental Status: She is alert.    Laboratory examination:   Recent Labs    02/18/23 0100 02/18/23 0551 02/18/23 1018  NA 128* 128* 126*  K 3.4* 3.3* 3.8  CL 93* 92* 92*  CO2 25 23 24   GLUCOSE 123* 126* 165*  BUN 14 15 16   CREATININE 0.73 0.76 0.79  CALCIUM 8.1* 8.1* 8.1*  GFRNONAA >60 >60 >60   estimated creatinine clearance is 48.1 mL/min (by C-G formula based on SCr of 0.79 mg/dL).     Latest Ref Rng & Units 02/18/2023   10:18 AM 02/18/2023    5:51 AM 02/18/2023    1:00 AM  CMP  Glucose 70 - 99 mg/dL 417  408  144   BUN 8 - 23 mg/dL 16  15  14    Creatinine 0.44 - 1.00 mg/dL 8.18  5.63  1.49   Sodium 135 - 145 mmol/L 126  128  128   Potassium 3.5 - 5.1 mmol/L 3.8  3.3  3.4   Chloride 98 - 111 mmol/L 92  92  93   CO2 22 - 32 mmol/L 24  23  25    Calcium 8.9 - 10.3 mg/dL 8.1  8.1  8.1       Latest Ref Rng & Units 02/18/2023    1:00 AM 02/17/2023     9:22 AM 02/12/2023    8:23 AM  CBC  WBC 4.0 - 10.5 K/uL 8.0  8.9  7.8   Hemoglobin 12.0 - 15.0 g/dL 70.2  63.7  85.8   Hematocrit 36.0 - 46.0 % 32.3  35.3  35.3   Platelets  150 - 400 K/uL 276  278  147    Lipid Panel No results for input(s): "CHOL", "TRIG", "LDLCALC", "VLDL", "HDL", "CHOLHDL", "LDLDIRECT" in the last 8760 hours.  HEMOGLOBIN A1C Lab Results  Component Value Date   HGBA1C 5.8 (H) 07/16/2019   MPG 111.15 08/30/2018   TSH Recent Labs    04/02/22 1830 02/17/23 0922  TSH 0.891 1.499   BNP (last 3 results) Recent Labs    05/15/22 2020 06/19/22 1012 02/17/23 0922  BNP 744.7* 176.8* 454.3*   Cardiac Panel (last 3 results) Recent Labs    02/17/23 0922 02/17/23 1115  TROPONINIHS 24* 23*     Medications and allergies   Allergies  Allergen Reactions   Penicillins Other (See Comments)    UNSPECIFIED REACTION  Patient does not remember reaction.  Has patient had a PCN reaction causing immediate rash, facial/tongue/throat swelling, SOB or lightheadedness with hypotension: no Has patient had a PCN reaction causing severe rash involving mucus membranes or skin necrosis: no Has patient had a PCN reaction that required hospitalization no Has patient had a PCN reaction occurring within the last 10 years: no If all of the above answers are "NO", then may proceed with Cephalosporin use.    Sulfa Antibiotics Other (See Comments)    UNSPECIFIED REACTION  "maybe vision issues? "     Current Meds  Medication Sig   acyclovir (ZOVIRAX) 400 MG tablet Take 400 mg by mouth 2 (two) times daily. Pt takes 1 tablet (400 mg total) by mouth 2 (two) times daily.   Artificial Tear Solution (SOOTHE XP) SOLN Place 1 drop into both eyes every evening.   aspirin EC 81 MG tablet Take 81 mg by mouth at bedtime. Swallow whole.   carvedilol (COREG) 12.5 MG tablet Take 12.5 mg by mouth 2 (two) times daily with a meal.   Cholecalciferol (VITAMIN D3) 50 MCG (2000 UT) capsule Take 1  capsule (2,000 Units total) by mouth daily.   [employer] VITAMIN B12 1000 MCG tablet TAKE 1 TABLET BY MOUTH EVERY DAY   dapagliflozin propanediol (FARXIGA) 10 MG TABS tablet TAKE 1 TABLET BY MOUTH EVERY DAY   dexamethasone (DECADRON) 4 MG tablet Take 5 tablets (20 mg total) by mouth once a week. Take 20 mg (5 tablets) on day of multiple myeloma treatment   esomeprazole (NEXIUM) 20 MG capsule Take 20 mg by mouth daily as needed (Heartburn).   furosemide (LASIX) 40 MG tablet Take 1 tablet (40 mg total) by mouth daily.   gabapentin (NEURONTIN) 300 MG capsule Take 3 capsules (900 mg total) by mouth 2 (two) times daily.   hydrocortisone cream 1 % Apply 1 Application topically 2 (two) times daily.   lenalidomide (REVLIMID) 25 MG capsule Take 1 capsule (25 mg total) by mouth daily. Take for 14 days,then none for 7 days.Repeat every 21 days. Auth # [id number]  Date obtained 02/08/23   levothyroxine (SYNTHROID, LEVOTHROID) 100 MCG tablet Take 100 mcg by mouth daily before breakfast.   losartan (COZAAR) 25 MG tablet TAKE 1 TABLET (25 MG TOTAL) BY MOUTH DAILY.   polyethylene glycol (MIRALAX / GLYCOLAX) 17 g packet Take 17 g by mouth daily as needed for mild constipation.   potassium chloride (KLOR-CON M10) 10 MEQ tablet Take 2 tablets (20 mEq total) by mouth 2 (two) times daily.   pravastatin (PRAVACHOL) 40 MG tablet Take 40 mg by mouth every evening.   triamcinolone ointment (KENALOG) 0.5 % Apply 1 Application topically 2 (two) times daily.  Scheduled Meds:  acyclovir  400 mg Oral BID   aspirin EC  81 mg Oral QHS   gabapentin  600 mg Oral TID   levothyroxine  100 mcg Oral Q0600   metoprolol tartrate  5 mg Intravenous Q8H   midodrine  10 mg Oral TID WC   pantoprazole  40 mg Oral Daily   potassium chloride  40 mEq Oral Q2H   pravastatin  40 mg Oral QPM   sertraline  25 mg Oral Daily   sodium chloride flush  3 mL Intravenous Q12H   triamcinolone ointment  1 Application Topical BID   Continuous Infusions:   amiodarone 30 mg/hr (02/18/23 1015)   heparin 1,100 Units/hr (02/18/23 1038)   PRN Meds:.acetaminophen **OR** acetaminophen, ondansetron, polyethylene glycol, prochlorperazine   I/O last 3 completed shifts: In: 247.9 [I.V.:247.9] Out: 1400 [Urine:1400] Total I/O In: 240 [P.O.:240] Out: -   Net IO Since Admission: -912.14 mL [02/18/23 1129]   Radiology:   Imaging results have been reviewed and CT ABDOMEN PELVIS WO CONTRAST  Result Date: 02/17/2023 CLINICAL DATA:  Abdominal pain, acute, nonlocalized abd pain, distension, EXAM: CT ABDOMEN AND PELVIS WITHOUT CONTRAST TECHNIQUE: Multidetector CT imaging of the abdomen and pelvis was performed following the standard protocol without IV contrast. RADIATION DOSE REDUCTION: This exam was performed according to the departmental dose-optimization program which includes automated exposure control, adjustment of the mA and/or kV according to patient size and/or use of iterative reconstruction technique. COMPARISON:  MR 09/23/2022 and previous FINDINGS: Lower chest: Post TAVR. Small pleural effusions. Dependent atelectasis posteriorly in both lower lobes. 9 mm nodule in the posterior right lower lobe (Im5,Se4) , new since 08/15/2018. Hepatobiliary: 6.7 cm low-attenuation lesion in hepatic segment 8 corresponding to hemangioma on MR 09/23/2022. No new liver lesion or biliary ductal dilatation. Gallbladder incompletely distended. No calcified gallstones. Pancreas: Unremarkable. No pancreatic ductal dilatation or surrounding inflammatory changes. Spleen: Normal in size without focal abnormality. Adrenals/Urinary Tract: No adrenal mass. Symmetric renal contours without urolithiasis. No hydronephrosis. Urinary bladder partially distended. Stomach/Bowel: Stomach is nondistended, unremarkable. Small bowel decompressed. Calcified appendicoliths without appendiceal wall thickening or regional inflammatory change. The colon is partially distended by gas and fecal  material, without acute finding. Vascular/Lymphatic: Coarse aortoiliac calcified atheromatous plaque without aneurysm. No abdominal or pelvic adenopathy. Reproductive: Status post hysterectomy. No adnexal masses. Other: Bilateral pelvic phleboliths.  No ascites.  No free air. Musculoskeletal: Post vertebral augmentation T8 and T12. Multilevel lumbar spondylitic change with grade 1 anterolisthesis L4-5, presumably related to advanced facet DJD. No fracture or worrisome bone lesion. IMPRESSION: 1. No acute findings. 2. 9 mm right lower lobe pulmonary nodule. Per Fleischner Society Guidelines, recommend prompt non-contrast Chest CT for further evaluation. These guidelines do not apply to immunocompromised patients and patients with cancer. Follow up in patients with significant comorbidities as clinically warranted. For lung cancer screening, adhere to Lung-RADS guidelines. Reference: Radiology. 2017; 284(1):228-43. 3. Aortic Atherosclerosis (ICD10-I70.0). Electronically Signed   By: Corlis Leak M.D.   On: 02/17/2023 14:56   ECHOCARDIOGRAM COMPLETE  Result Date: 02/17/2023    ECHOCARDIOGRAM REPORT   Patient Name:   CALLIOPE DELANGEL Date of Exam: 02/17/2023 Medical Rec #:  [id number]           Height:       63.0 in Accession #:    [id number]          Weight:       160.0 lb Date of Birth:  03-23-1934  BSA:          1.759 m Patient Age:    89 years            BP:           162/76 mmHg Patient Gender: F                   HR:           76 bpm. Exam Location:  Inpatient Procedure: 2D Echo, Cardiac Doppler, Color Doppler and Strain Analysis Indications:    R07.9* Chest pain, unspecified  History:        Patient has prior history of Echocardiogram examinations, most                 recent 04/03/2022. CHF, Multiple, Aortic Valve Disease; Risk                 Factors:Hypertension, Dyslipidemia and Non-Smoker.  Sonographer:    Dondra Prader RVT RCS Referring Phys: [id number] [facility] Alex Mcmanigal  Sonographer Comments: Global  longitudinal strain was attempted. IMPRESSIONS  1. Left ventricular ejection fraction, by estimation, is 55 to 60%. The left ventricle has normal function. The left ventricle has no regional wall motion abnormalities. There is mild left ventricular hypertrophy. Left ventricular diastolic parameters are consistent with Grade II diastolic dysfunction (pseudonormalization). Elevated left atrial pressure.  2. Right ventricular systolic function is normal. The right ventricular size is normal. There is mildly elevated pulmonary artery systolic pressure.  3. Left atrial size was severely dilated.  4. The mitral valve is abnormal. Mild mitral valve regurgitation. Moderate mitral stenosis.  5. The tricuspid valve is abnormal.  6. Edwards Sapien 3 THV size 23 mm is in the AV position. . The aortic valve has been repaired/replaced. Aortic valve regurgitation is not visualized. No aortic stenosis is present.  7. The inferior vena cava is dilated in size with >50% respiratory variability, suggesting right atrial pressure of 8 mmHg. FINDINGS  Left Ventricle: Left ventricular ejection fraction, by estimation, is 55 to 60%. The left ventricle has normal function. The left ventricle has no regional wall motion abnormalities. Global longitudinal strain performed but not reported based on interpreter judgement due to suboptimal tracking. The left ventricular internal cavity size was normal in size. There is mild left ventricular hypertrophy. Left ventricular diastolic parameters are consistent with Grade II diastolic dysfunction (pseudonormalization). Elevated left atrial pressure. Right Ventricle: The right ventricular size is normal. Right vetricular wall thickness was not well visualized. Right ventricular systolic function is normal. There is mildly elevated pulmonary artery systolic pressure. The tricuspid regurgitant velocity  is 2.92 m/s, and with an assumed right atrial pressure of 8 mmHg, the estimated right ventricular  systolic pressure is 42.1 mmHg. Left Atrium: Left atrial size was severely dilated. Right Atrium: Right atrial size was normal in size. Pericardium: There is no evidence of pericardial effusion. Mitral Valve: The mitral valve is abnormal. There is mild thickening of the mitral valve leaflet(s). There is mild calcification of the mitral valve leaflet(s). Mild mitral annular calcification. Mild mitral valve regurgitation. Moderate mitral valve stenosis. The mean mitral valve gradient is 6.0 mmHg. Tricuspid Valve: The tricuspid valve is abnormal. Tricuspid valve regurgitation is mild . No evidence of tricuspid stenosis. Aortic Valve: Edwards Sapien 3 THV size 23 mm is in the AV position. The aortic valve has been repaired/replaced. Aortic valve regurgitation is not visualized. No aortic stenosis is present. Aortic valve mean gradient measures 10.0 mmHg. Aortic valve peak  gradient measures 17.5 mmHg. Aortic valve area, by VTI measures 2.30 cm. Pulmonic Valve: The pulmonic valve was not well visualized. Pulmonic valve regurgitation is mild. No evidence of pulmonic stenosis. Aorta: The aortic root is normal in size and structure. Venous: The inferior vena cava is dilated in size with greater than 50% respiratory variability, suggesting right atrial pressure of 8 mmHg. IAS/Shunts: No atrial level shunt detected by color flow Doppler.  LEFT VENTRICLE PLAX 2D LVIDd:         3.80 cm   Diastology LVIDs:         2.70 cm   LV e' medial:    5.52 cm/s LV PW:         1.20 cm   LV E/e' medial:  33.3 LV IVS:        1.20 cm   LV e' lateral:   5.56 cm/s LVOT diam:     1.60 cm   LV E/e' lateral: 33.1 LV SV:         81 LV SV Index:   46 LVOT Area:     2.01 cm  RIGHT VENTRICLE             IVC RV Basal diam:  4.00 cm     IVC diam: 2.30 cm RV Mid diam:    3.10 cm RV S prime:     13.30 cm/s TAPSE (M-mode): 2.5 cm LEFT ATRIUM             Index        RIGHT ATRIUM           Index LA diam:        4.20 cm 2.39 cm/m   RA Area:     16.60 cm LA  Vol (A2C):   93.4 ml 53.11 ml/m  RA Volume:   38.50 ml  21.89 ml/m LA Vol (A4C):   88.6 ml 50.38 ml/m LA Biplane Vol: 92.3 ml 52.48 ml/m  AORTIC VALVE                     PULMONIC VALVE AV Area (Vmax):    1.80 cm      PV Vmax:       0.88 m/s AV Area (Vmean):   1.84 cm      PV Peak grad:  3.1 mmHg AV Area (VTI):     2.30 cm AV Vmax:           209.00 cm/s AV Vmean:          143.000 cm/s AV VTI:            0.351 m AV Peak Grad:      17.5 mmHg AV Mean Grad:      10.0 mmHg LVOT Vmax:         187.00 cm/s LVOT Vmean:        131.000 cm/s LVOT VTI:          0.401 m LVOT/AV VTI ratio: 1.14 AR Vena Contracta: 0.30 cm  AORTA Ao Root diam: 2.40 cm Ao Asc diam:  2.70 cm Ao Arch diam: 3.0 cm MITRAL VALVE                TRICUSPID VALVE MV Area (PHT): 2.62 cm     TR Peak grad:   34.1 mmHg MV Mean grad:  6.0 mmHg     TR Vmax:        292.00 cm/s MV Decel Time: 290 msec MV E velocity:  184.00 cm/s  SHUNTS MV A velocity: 133.00 cm/s  Systemic VTI:  0.40 m MV E/A ratio:  1.38         Systemic Diam: 1.60 cm Dina Rich MD Electronically signed by Dina Rich MD Signature Date/Time: 02/17/2023/2:51:58 PM    Final    DG Chest 2 View  Result Date: 02/17/2023 CLINICAL DATA:  Chest pain EXAM: CHEST - 2 VIEW COMPARISON:  Chest x-ray May 15, 2022 FINDINGS: The patient is status post TAVR. Stable cardiomegaly. The hila and mediastinum are unchanged. No pneumothorax. No nodules or masses. No focal infiltrates. No overt edema. The patient is status post vertebroplasties at 2 levels. No acute bony changes. Possible tiny pleural effusions. IMPRESSION: 1. Possible tiny pleural effusions. No other acute abnormalities. 2. Stable cardiomegaly. Electronically Signed   By: Gerome Sam III M.D.   On: 02/17/2023 09:41    Cardiac Studies:   Coronary angiogram 08/06/2018: normal coronary arteries. Moderate pulmonary hypertension due to AS with elevated PW. Preserved CO and CI.   TAVR 09/03/2018: Edwards Sapien 3 THV (size 23 mm,  model # 9600TFX, serial # [id number]) by Ashley Mariner and Earney Hamburg.   CXR PA/LAT 11/07/2018:  Cardiac shadow is enlarged but stable. Changes of prior TAVR ar again noted. Aortic calcifications are seen. Previously noted left upper lobe pneumonia has resolved in the interval. No focal infiltrate or sizable effusion is seen. Changes of prior vertebral augmentation are noted.   Carotid artery duplex 01/05/2021: Minimal stenosis in the right internal carotid artery (1-15%). Stenosis in the right external carotid artery (<50%). Duplex suggests stenosis in the left internal carotid artery (50-69%). Stenosis in the left external carotid artery (<50%). Antegrade right vertebral artery flow. Antegrade left vertebral artery flow. Follow up in six months is appropriate if clinically indicated.  No significant change since 06/23/2020.   ECHOCARDIOGRAM COMPLETE 04/03/2022:  1. Left ventricular ejection fraction, by estimation, is 60 to 65%. The left ventricle has normal function. The left ventricle has no regional wall motion abnormalities. There is moderate left ventricular hypertrophy. Left ventricular diastolic  parameters are consistent with Grade II diastolic dysfunction (pseudonormalization).  2. Right ventricular systolic function is normal. The right ventricular size is normal. Tricuspid regurgitation signal is inadequate for assessing PA pressure.  3. Left atrial size was moderately dilated.  4. The mitral valve is degenerative. Trivial mitral valve regurgitation. Moderate mitral annular calcification.  5. Aortic valve regurgitation is not visualized. Procedure Date: 09/03/2018.  6. Aortic no signficant ascending aortic aneurysm.  7. The inferior vena cava is normal in size with greater than 50% respiratory variability, suggesting right atrial pressure of 3 mmHg.   Comparison(s): No significant change from prior study.    Carotid artery duplex 09/21/2022:  Duplex suggests stenosis in the right  internal carotid artery (1-15%).  Duplex suggests stenosis in the left internal carotid artery (16-49%).  Antegrade right vertebral artery flow. Antegrade left vertebral artery flow.  Compared to the study done on 02/15/2022, left ICA stenosis of >50% has now regressed.  Otherwise no significant change.  There is mild to moderate heterogenous plaque noted in the bilateral carotid arteries. Follow up in one year is appropriate if clinically indicated.    EKG  EKG 06/22/2022: Normal sinus rhythm at a rate of 72 bpm, normal axis, no evidence of ischemia, normal EKG.  No change from 01/24/2022.    EKG:  02/18/2023: Atrial fibrillation with rapid ventricular response. Nonspecific ST abnormality   Assessment & Recommendations:  New onset Afib RVR Suspect chest pain was related to Afib RVR and severe anxiety Amio gtt and bolus ordered, check TSH at next office visit. TSH normal in ED. Rates are better this morning. I will start Toprol XL 12.5 mg qHS. Eliquis dosing by pharmacy.  LVEF remains preserved Lopressor 5 mg IV TID PRN for sustained HR >115  Chronic heart failure with preserved ejection fraction  Patient is presently well compensated.   She was advised to furosemide only on a as needed basis at last office visit given hyponatremia when she takes it every day.     TAVR 09/03/2018: Edwards Sapien 3 THV (size 23 mm, model # 9600TFX, serial # [id number]) No aortic stenosis or regurgitation per TTE 02/17/2023   Primary hypertension Blood pressure is well-controlled.    Asymptomatic bilateral carotid artery stenosis Left carotid artery stenosis has been present, she will need annual surveillance only.     Hypercholesteremia Continue statin      Clotilde Dieter, DO 02/18/2023, 11:29 AM Office: [phone]

## 2023-02-19 ENCOUNTER — Other Ambulatory Visit (HOSPITAL_COMMUNITY): Payer: Self-pay

## 2023-02-19 DIAGNOSIS — R079 Chest pain, unspecified: Secondary | ICD-10-CM

## 2023-02-19 LAB — COMPREHENSIVE METABOLIC PANEL
ALT: 13 U/L (ref 0–44)
AST: 12 U/L — ABNORMAL LOW (ref 15–41)
Albumin: 2.5 g/dL — ABNORMAL LOW (ref 3.5–5.0)
Alkaline Phosphatase: 62 U/L (ref 38–126)
Anion gap: 6 (ref 5–15)
BUN: 27 mg/dL — ABNORMAL HIGH (ref 8–23)
CO2: 25 mmol/L (ref 22–32)
Calcium: 8.1 mg/dL — ABNORMAL LOW (ref 8.9–10.3)
Chloride: 95 mmol/L — ABNORMAL LOW (ref 98–111)
Creatinine, Ser: 0.9 mg/dL (ref 0.44–1.00)
GFR, Estimated: >60 mL/min (ref >60)
Glucose, Bld: 121 mg/dL — ABNORMAL HIGH (ref 70–99)
Potassium: 4.5 mmol/L (ref 3.5–5.1)
Sodium: 126 mmol/L — ABNORMAL LOW (ref 135–145)
Total Bilirubin: 0.4 mg/dL (ref 0.3–1.2)
Total Protein: 4.7 g/dL — ABNORMAL LOW (ref 6.5–8.1)

## 2023-02-19 LAB — CBC
HCT: 32.7 % — ABNORMAL LOW (ref 36.0–46.0)
Hemoglobin: 11.2 g/dL — ABNORMAL LOW (ref 12.0–15.0)
MCH: 30.1 pg (ref 26.0–34.0)
MCHC: 34.3 g/dL (ref 30.0–36.0)
MCV: 87.9 fL (ref 80.0–100.0)
Platelets: 320 10*3/uL (ref 150–400)
RBC: 3.72 MIL/uL — ABNORMAL LOW (ref 3.87–5.11)
RDW: 18.2 % — ABNORMAL HIGH (ref 11.5–15.5)
WBC: 9.9 10*3/uL (ref 4.0–10.5)
nRBC: 0 % (ref 0.0–0.2)

## 2023-02-19 LAB — MAGNESIUM: Magnesium: 1.8 mg/dL (ref 1.7–2.4)

## 2023-02-19 MED ORDER — APIXABAN 5 MG PO TABS: 5.0000 mg | ORAL_TABLET | Freq: Two times a day (BID) | ORAL | 0 refills | Status: DC

## 2023-02-19 MED ORDER — MAGNESIUM SULFATE 2 GM/50ML IV SOLN
2.0000 g | Freq: Once | INTRAVENOUS | Status: AC
  Administered 2023-02-19: 2 g via INTRAVENOUS
  Filled 2023-02-19: qty 50

## 2023-02-19 MED ORDER — METOPROLOL SUCCINATE ER 25 MG PO TB24: 12.5000 mg | ORAL_TABLET | Freq: Every day | ORAL | 0 refills | Status: DC

## 2023-02-19 NOTE — TOC Benefit Eligibility Note (Signed)
Patient Advocate Encounter  Insurance verification completed.    The patient is currently admitted and upon discharge could be taking Eliquis 5 mg.  The current 30 day co-pay is $0.00.   The patient is insured through [employer] Medicare Part D   This test claim was processed through [facility]- copay amounts may vary at other pharmacies due to pharmacy/plan contracts, or as the patient moves through the different stages of their insurance plan.  Annaliz Aven, CPHT Pharmacy Patient Advocate Specialist [facility] Patient Advocate Team Direct Number: [phone]  Fax: [phone]       

## 2023-02-19 NOTE — Discharge Instructions (Signed)

## 2023-02-19 NOTE — Care Management (Signed)
  Transition of Care [facility]) Screening Note   Patient Details  Name: Zoe [name] Date of Birth: 1934/07/27   Transition of Care [facility]) CM/SW Contact:    Gala Lewandowsky, RN Phone Number: 02/19/2023, 2:43 PM    Transition of Care Department [facility]) has reviewed the patient and no TOC needs have been identified at this time. Case manager received the recommendations for outpatient Physical Therapy. Case Manager spoke with patient and son at the bedside regarding disposition plan of care. Patient states she receives therapy at [facility]- Case Manager unable to submit an ambulatory referral for this location. MD is aware that patient will need a written Rx for resumption orders. No further needs identified. Son will provide transportation home.

## 2023-02-19 NOTE — Progress Notes (Signed)
Subjective:  Patient seen and examined at bedside, resting comfortably. No events overnight. Denies chest pain, shortness of breath, palpitations, diaphoresis, syncope, edema, PND, orthopnea.   Intake/Output from previous day:  I/O last 3 completed shifts: In: 997 [P.O.:240; I.V.:757] Out: 1050 [Urine:1050] No intake/output data recorded. Net IO Since Admission: -853.02 mL [02/19/23 0849]  Blood pressure (!) 152/69, pulse 61, temperature 98.2 F (36.8 C), temperature source Oral, resp. rate 20, height 5\' 3"  (1.6 m), weight 75.1 kg, SpO2 (!) 81 %. Physical Exam Vitals reviewed.  HENT:     Head: Normocephalic and atraumatic.  Cardiovascular:     Rate and Rhythm: Normal rate and regular rhythm.     Heart sounds: Normal heart sounds. No murmur heard. Pulmonary:     Effort: Pulmonary effort is normal.     Breath sounds: Normal breath sounds.  Abdominal:     General: Bowel sounds are normal.  Musculoskeletal:     Right lower leg: No edema.     Left lower leg: No edema.  Skin:    General: Skin is warm and dry.  Neurological:     Mental Status: She is alert.     Lab Results: Lab Results  Component Value Date   NA 126 (L) 02/19/2023   K 4.5 02/19/2023   CO2 25 02/19/2023   GLUCOSE 121 (H) 02/19/2023   BUN 27 (H) 02/19/2023   CREATININE 0.90 02/19/2023   CALCIUM 8.1 (L) 02/19/2023   EGFR 59 (L) 06/19/2022   GFRNONAA >60 02/19/2023    BNP (last 3 results) Recent Labs    05/15/22 2020 06/19/22 1012 02/17/23 0922  BNP 744.7* 176.8* 454.3*    ProBNP (last 3 results) No results for input(s): "PROBNP" in the last 8760 hours.    Latest Ref Rng & Units 02/19/2023    1:21 AM 02/18/2023   10:18 AM 02/18/2023    5:51 AM  BMP  Glucose 70 - 99 mg/dL 829  562  130   BUN 8 - 23 mg/dL 27  16  15    Creatinine 0.44 - 1.00 mg/dL 8.65  7.84  6.96   Sodium 135 - 145 mmol/L 126  126  128   Potassium 3.5 - 5.1 mmol/L 4.5  3.8  3.3   Chloride 98 - 111 mmol/L 95  92  92   CO2 22 - 32  mmol/L 25  24  23    Calcium 8.9 - 10.3 mg/dL 8.1  8.1  8.1       Latest Ref Rng & Units 02/19/2023    1:21 AM 02/17/2023    9:22 AM 02/12/2023    8:23 AM  Hepatic Function  Total Protein 6.5 - 8.1 g/dL 4.7  5.8  5.8   Albumin 3.5 - 5.0 g/dL 2.5  3.1  3.4   AST 15 - 41 U/L 12  17  13    ALT 0 - 44 U/L 13  16  13    Alk Phosphatase 38 - 126 U/L 62  64  63   Total Bilirubin 0.3 - 1.2 mg/dL 0.4  1.2  1.4       Latest Ref Rng & Units 02/19/2023    1:21 AM 02/18/2023    1:00 AM 02/17/2023    9:22 AM  CBC  WBC 4.0 - 10.5 K/uL 9.9  8.0  8.9   Hemoglobin 12.0 - 15.0 g/dL 29.5  28.4  13.2   Hematocrit 36.0 - 46.0 % 32.7  32.3  35.3   Platelets 150 -  400 K/uL 320  276  278    Lipid Panel     Component Value Date/Time   CHOL 103 07/16/2019 0941   TRIG 75 07/16/2019 0941   HDL 48 07/16/2019 0941   LDLCALC 39 07/16/2019 0941   LDLDIRECT 48 07/16/2019 0941   Cardiac Panel (last 3 results) No results for input(s): "CKTOTAL", "CKMB", "TROPONINI", "RELINDX" in the last 72 hours.  HEMOGLOBIN A1C Lab Results  Component Value Date   HGBA1C 5.8 (H) 07/16/2019   MPG 111.15 08/30/2018   TSH Recent Labs    04/02/22 1830 02/17/23 0922  TSH 0.891 1.499   Imaging: Imaging results have been reviewed and CT ABDOMEN PELVIS WO CONTRAST  Result Date: 02/17/2023 CLINICAL DATA:  Abdominal pain, acute, nonlocalized abd pain, distension, EXAM: CT ABDOMEN AND PELVIS WITHOUT CONTRAST TECHNIQUE: Multidetector CT imaging of the abdomen and pelvis was performed following the standard protocol without IV contrast. RADIATION DOSE REDUCTION: This exam was performed according to the departmental dose-optimization program which includes automated exposure control, adjustment of the mA and/or kV according to patient size and/or use of iterative reconstruction technique. COMPARISON:  MR 09/23/2022 and previous FINDINGS: Lower chest: Post TAVR. Small pleural effusions. Dependent atelectasis posteriorly in both lower  lobes. 9 mm nodule in the posterior right lower lobe (Im5,Se4) , new since 08/15/2018. Hepatobiliary: 6.7 cm low-attenuation lesion in hepatic segment 8 corresponding to hemangioma on MR 09/23/2022. No new liver lesion or biliary ductal dilatation. Gallbladder incompletely distended. No calcified gallstones. Pancreas: Unremarkable. No pancreatic ductal dilatation or surrounding inflammatory changes. Spleen: Normal in size without focal abnormality. Adrenals/Urinary Tract: No adrenal mass. Symmetric renal contours without urolithiasis. No hydronephrosis. Urinary bladder partially distended. Stomach/Bowel: Stomach is nondistended, unremarkable. Small bowel decompressed. Calcified appendicoliths without appendiceal wall thickening or regional inflammatory change. The colon is partially distended by gas and fecal material, without acute finding. Vascular/Lymphatic: Coarse aortoiliac calcified atheromatous plaque without aneurysm. No abdominal or pelvic adenopathy. Reproductive: Status post hysterectomy. No adnexal masses. Other: Bilateral pelvic phleboliths.  No ascites.  No free air. Musculoskeletal: Post vertebral augmentation T8 and T12. Multilevel lumbar spondylitic change with grade 1 anterolisthesis L4-5, presumably related to advanced facet DJD. No fracture or worrisome bone lesion. IMPRESSION: 1. No acute findings. 2. 9 mm right lower lobe pulmonary nodule. Per Fleischner Society Guidelines, recommend prompt non-contrast Chest CT for further evaluation. These guidelines do not apply to immunocompromised patients and patients with cancer. Follow up in patients with significant comorbidities as clinically warranted. For lung cancer screening, adhere to Lung-RADS guidelines. Reference: Radiology. 2017; 284(1):228-43. 3. Aortic Atherosclerosis (ICD10-I70.0). Electronically Signed   By: Corlis Leak M.D.   On: 02/17/2023 14:56   ECHOCARDIOGRAM COMPLETE  Result Date: 02/17/2023    ECHOCARDIOGRAM REPORT   Patient  Name:   Zoe [name] Date of Exam: 02/17/2023 Medical Rec #:  [id number]           Height:       63.0 in Accession #:    [id number]          Weight:       160.0 lb Date of Birth:  1934-04-24            BSA:          1.759 m Patient Age:    89 years            BP:           162/76 mmHg Patient Gender: F  HR:           76 bpm. Exam Location:  Inpatient Procedure: 2D Echo, Cardiac Doppler, Color Doppler and Strain Analysis Indications:    R07.9* Chest pain, unspecified  History:        Patient has prior history of Echocardiogram examinations, most                 recent 04/03/2022. CHF, Multiple, Aortic Valve Disease; Risk                 Factors:Hypertension, Dyslipidemia and Non-Smoker.  Sonographer:    Dondra Prader RVT RCS Referring Phys: [id number] [facility] [place]  Sonographer Comments: Global longitudinal strain was attempted. IMPRESSIONS  1. Left ventricular ejection fraction, by estimation, is 55 to 60%. The left ventricle has normal function. The left ventricle has no regional wall motion abnormalities. There is mild left ventricular hypertrophy. Left ventricular diastolic parameters are consistent with Grade II diastolic dysfunction (pseudonormalization). Elevated left atrial pressure.  2. Right ventricular systolic function is normal. The right ventricular size is normal. There is mildly elevated pulmonary artery systolic pressure.  3. Left atrial size was severely dilated.  4. The mitral valve is abnormal. Mild mitral valve regurgitation. Moderate mitral stenosis.  5. The tricuspid valve is abnormal.  6. Edwards Sapien 3 THV size 23 mm is in the AV position. . The aortic valve has been repaired/replaced. Aortic valve regurgitation is not visualized. No aortic stenosis is present.  7. The inferior vena cava is dilated in size with >50% respiratory variability, suggesting right atrial pressure of 8 mmHg. FINDINGS  Left Ventricle: Left ventricular ejection fraction, by estimation, is 55 to 60%.  The left ventricle has normal function. The left ventricle has no regional wall motion abnormalities. Global longitudinal strain performed but not reported based on interpreter judgement due to suboptimal tracking. The left ventricular internal cavity size was normal in size. There is mild left ventricular hypertrophy. Left ventricular diastolic parameters are consistent with Grade II diastolic dysfunction (pseudonormalization). Elevated left atrial pressure. Right Ventricle: The right ventricular size is normal. Right vetricular wall thickness was not well visualized. Right ventricular systolic function is normal. There is mildly elevated pulmonary artery systolic pressure. The tricuspid regurgitant velocity  is 2.92 m/s, and with an assumed right atrial pressure of 8 mmHg, the estimated right ventricular systolic pressure is 42.1 mmHg. Left Atrium: Left atrial size was severely dilated. Right Atrium: Right atrial size was normal in size. Pericardium: There is no evidence of pericardial effusion. Mitral Valve: The mitral valve is abnormal. There is mild thickening of the mitral valve leaflet(s). There is mild calcification of the mitral valve leaflet(s). Mild mitral annular calcification. Mild mitral valve regurgitation. Moderate mitral valve stenosis. The mean mitral valve gradient is 6.0 mmHg. Tricuspid Valve: The tricuspid valve is abnormal. Tricuspid valve regurgitation is mild . No evidence of tricuspid stenosis. Aortic Valve: Edwards Sapien 3 THV size 23 mm is in the AV position. The aortic valve has been repaired/replaced. Aortic valve regurgitation is not visualized. No aortic stenosis is present. Aortic valve mean gradient measures 10.0 mmHg. Aortic valve peak gradient measures 17.5 mmHg. Aortic valve area, by VTI measures 2.30 cm. Pulmonic Valve: The pulmonic valve was not well visualized. Pulmonic valve regurgitation is mild. No evidence of pulmonic stenosis. Aorta: The aortic root is normal in size and  structure. Venous: The inferior vena cava is dilated in size with greater than 50% respiratory variability, suggesting right atrial pressure of 8 mmHg. IAS/Shunts:  No atrial level shunt detected by color flow Doppler.  LEFT VENTRICLE PLAX 2D LVIDd:         3.80 cm   Diastology LVIDs:         2.70 cm   LV e' medial:    5.52 cm/s LV PW:         1.20 cm   LV E/e' medial:  33.3 LV IVS:        1.20 cm   LV e' lateral:   5.56 cm/s LVOT diam:     1.60 cm   LV E/e' lateral: 33.1 LV SV:         81 LV SV Index:   46 LVOT Area:     2.01 cm  RIGHT VENTRICLE             IVC RV Basal diam:  4.00 cm     IVC diam: 2.30 cm RV Mid diam:    3.10 cm RV S prime:     13.30 cm/s TAPSE (M-mode): 2.5 cm LEFT ATRIUM             Index        RIGHT ATRIUM           Index LA diam:        4.20 cm 2.39 cm/m   RA Area:     16.60 cm LA Vol (A2C):   93.4 ml 53.11 ml/m  RA Volume:   38.50 ml  21.89 ml/m LA Vol (A4C):   88.6 ml 50.38 ml/m LA Biplane Vol: 92.3 ml 52.48 ml/m  AORTIC VALVE                     PULMONIC VALVE AV Area (Vmax):    1.80 cm      PV Vmax:       0.88 m/s AV Area (Vmean):   1.84 cm      PV Peak grad:  3.1 mmHg AV Area (VTI):     2.30 cm AV Vmax:           209.00 cm/s AV Vmean:          143.000 cm/s AV VTI:            0.351 m AV Peak Grad:      17.5 mmHg AV Mean Grad:      10.0 mmHg LVOT Vmax:         187.00 cm/s LVOT Vmean:        131.000 cm/s LVOT VTI:          0.401 m LVOT/AV VTI ratio: 1.14 AR Vena Contracta: 0.30 cm  AORTA Ao Root diam: 2.40 cm Ao Asc diam:  2.70 cm Ao Arch diam: 3.0 cm MITRAL VALVE                TRICUSPID VALVE MV Area (PHT): 2.62 cm     TR Peak grad:   34.1 mmHg MV Mean grad:  6.0 mmHg     TR Vmax:        292.00 cm/s MV Decel Time: 290 msec MV E velocity: 184.00 cm/s  SHUNTS MV A velocity: 133.00 cm/s  Systemic VTI:  0.40 m MV E/A ratio:  1.38         Systemic Diam: 1.60 cm Dina Rich MD Electronically signed by Dina Rich MD Signature Date/Time: 02/17/2023/2:51:58 PM    Final      Cardiac Studies:  Coronary angiogram 08/06/2018: normal coronary arteries. Moderate pulmonary  hypertension due to AS with elevated PW. Preserved CO and CI.   TAVR 09/03/2018: Edwards Sapien 3 THV (size 23 mm, model # 9600TFX, serial # [id number]) by Ashley Mariner and Earney Hamburg.   CXR PA/LAT 11/07/2018:  Cardiac shadow is enlarged but stable. Changes of prior TAVR ar again noted. Aortic calcifications are seen. Previously noted left upper lobe pneumonia has resolved in the interval. No focal infiltrate or sizable effusion is seen. Changes of prior vertebral augmentation are noted.   Carotid artery duplex 01/05/2021: Minimal stenosis in the right internal carotid artery (1-15%). Stenosis in the right external carotid artery (<50%). Duplex suggests stenosis in the left internal carotid artery (50-69%). Stenosis in the left external carotid artery (<50%). Antegrade right vertebral artery flow. Antegrade left vertebral artery flow. Follow up in six months is appropriate if clinically indicated.  No significant change since 06/23/2020.   ECHOCARDIOGRAM COMPLETE 04/03/2022:  1. Left ventricular ejection fraction, by estimation, is 60 to 65%. The left ventricle has normal function. The left ventricle has no regional wall motion abnormalities. There is moderate left ventricular hypertrophy. Left ventricular diastolic  parameters are consistent with Grade II diastolic dysfunction (pseudonormalization).  2. Right ventricular systolic function is normal. The right ventricular size is normal. Tricuspid regurgitation signal is inadequate for assessing PA pressure.  3. Left atrial size was moderately dilated.  4. The mitral valve is degenerative. Trivial mitral valve regurgitation. Moderate mitral annular calcification.  5. Aortic valve regurgitation is not visualized. Procedure Date: 09/03/2018.  6. Aortic no signficant ascending aortic aneurysm.  7. The inferior vena cava is normal in size with greater  than 50% respiratory variability, suggesting right atrial pressure of 3 mmHg.   Comparison(s): No significant change from prior study.    Carotid artery duplex 09/21/2022:  Duplex suggests stenosis in the right internal carotid artery (1-15%).  Duplex suggests stenosis in the left internal carotid artery (16-49%).  Antegrade right vertebral artery flow. Antegrade left vertebral artery flow.  Compared to the study done on 02/15/2022, left ICA stenosis of >50% has now regressed.  Otherwise no significant change.  There is mild to moderate heterogenous plaque noted in the bilateral carotid arteries. Follow up in one year is appropriate if clinically indicated.    EKG  EKG 06/22/2022: Normal sinus rhythm at a rate of 72 bpm, normal axis, no evidence of ischemia, normal EKG.  No change from 01/24/2022.     EKG:   02/18/2023: Atrial fibrillation with rapid ventricular response. Nonspecific ST abnormality   Tele: normal sinus rhythm  Recent Results (from the past 16109 hour(s))  ECHOCARDIOGRAM COMPLETE   Collection Time: 02/17/23  2:37 PM  Result Value   Weight 2,560   Height 63   BP 162/76   S' Lateral 2.70   AR max vel 1.80   AV Area VTI 2.30   AV Mean grad 10.0   AV Peak grad 17.5   Ao pk vel 2.09   Area-P 1/2 2.62   AV Area mean vel 1.84   AV Vena cont 0.30   Est EF 55 - 60%   Narrative      ECHOCARDIOGRAM REPORT       Patient Name:   Zoe [name] Date of Exam: 02/17/2023 Medical Rec #:  [id number]           Height:       63.0 in Accession #:    [id number]          Weight:  160.0 lb Date of Birth:  12-13-1933            BSA:          1.759 m Patient Age:    89 years            BP:           162/76 mmHg Patient Gender: F                   HR:           76 bpm. Exam Location:  Inpatient  Procedure: 2D Echo, Cardiac Doppler, Color Doppler and Strain Analysis  Indications:    R07.9* Chest pain, unspecified   History:        Patient has prior history of Echocardiogram  examinations, most                 recent 04/03/2022. CHF, Multiple, Aortic Valve Disease; Risk                 Factors:Hypertension, Dyslipidemia and Non-Smoker.   Sonographer:    Dondra Prader RVT RCS Referring Phys: [id number] [facility] Philena Obey    Sonographer Comments: Global longitudinal strain was attempted. IMPRESSIONS    1. Left ventricular ejection fraction, by estimation, is 55 to 60%. The left ventricle has normal function. The left ventricle has no regional wall motion abnormalities. There is mild left ventricular hypertrophy. Left ventricular diastolic parameters  are consistent with Grade II diastolic dysfunction (pseudonormalization). Elevated left atrial pressure.  2. Right ventricular systolic function is normal. The right ventricular size is normal. There is mildly elevated pulmonary artery systolic pressure.  3. Left atrial size was severely dilated.  4. The mitral valve is abnormal. Mild mitral valve regurgitation. Moderate mitral stenosis.  5. The tricuspid valve is abnormal.  6. Edwards Sapien 3 THV size 23 mm is in the AV position. . The aortic valve has been repaired/replaced. Aortic valve regurgitation is not visualized. No aortic stenosis is present.  7. The inferior vena cava is dilated in size with >50% respiratory variability, suggesting right atrial pressure of 8 mmHg.  FINDINGS  Left Ventricle: Left ventricular ejection fraction, by estimation, is 55 to 60%. The left ventricle has normal function. The left ventricle has no regional wall motion abnormalities. Global longitudinal strain performed but not reported based on  interpreter judgement due to suboptimal tracking. The left ventricular internal cavity size was normal in size. There is mild left ventricular hypertrophy. Left ventricular diastolic parameters are consistent with Grade II diastolic dysfunction  (pseudonormalization). Elevated left atrial pressure.  Right Ventricle: The right ventricular size is  normal. Right vetricular wall thickness was not well visualized. Right ventricular systolic function is normal. There is mildly elevated pulmonary artery systolic pressure. The tricuspid regurgitant velocity  is 2.92 m/s, and with an assumed right atrial pressure of 8 mmHg, the estimated right ventricular systolic pressure is 42.1 mmHg.  Left Atrium: Left atrial size was severely dilated.  Right Atrium: Right atrial size was normal in size.  Pericardium: There is no evidence of pericardial effusion.  Mitral Valve: The mitral valve is abnormal. There is mild thickening of the mitral valve leaflet(s). There is mild calcification of the mitral valve leaflet(s). Mild mitral annular calcification. Mild mitral valve regurgitation. Moderate mitral valve  stenosis. The mean mitral valve gradient is 6.0 mmHg.  Tricuspid Valve: The tricuspid valve is abnormal. Tricuspid valve regurgitation is mild . No evidence of tricuspid stenosis.  Aortic Valve: Randa Evens  Sapien 3 THV size 23 mm is in the AV position. The aortic valve has been repaired/replaced. Aortic valve regurgitation is not visualized. No aortic stenosis is present. Aortic valve mean gradient measures 10.0 mmHg. Aortic valve  peak gradient measures 17.5 mmHg. Aortic valve area, by VTI measures 2.30 cm.  Pulmonic Valve: The pulmonic valve was not well visualized. Pulmonic valve regurgitation is mild. No evidence of pulmonic stenosis.  Aorta: The aortic root is normal in size and structure.  Venous: The inferior vena cava is dilated in size with greater than 50% respiratory variability, suggesting right atrial pressure of 8 mmHg.  IAS/Shunts: No atrial level shunt detected by color flow Doppler.    LEFT VENTRICLE PLAX 2D LVIDd:         3.80 cm   Diastology LVIDs:         2.70 cm   LV e' medial:    5.52 cm/s LV PW:         1.20 cm   LV E/e' medial:  33.3 LV IVS:        1.20 cm   LV e' lateral:   5.56 cm/s LVOT diam:     1.60 cm   LV E/e'  lateral: 33.1 LV SV:         81 LV SV Index:   46 LVOT Area:     2.01 cm    RIGHT VENTRICLE             IVC RV Basal diam:  4.00 cm     IVC diam: 2.30 cm RV Mid diam:    3.10 cm RV S prime:     13.30 cm/s TAPSE (M-mode): 2.5 cm  LEFT ATRIUM             Index        RIGHT ATRIUM           Index LA diam:        4.20 cm 2.39 cm/m   RA Area:     16.60 cm LA Vol (A2C):   93.4 ml 53.11 ml/m  RA Volume:   38.50 ml  21.89 ml/m LA Vol (A4C):   88.6 ml 50.38 ml/m LA Biplane Vol: 92.3 ml 52.48 ml/m  AORTIC VALVE                     PULMONIC VALVE AV Area (Vmax):    1.80 cm      PV Vmax:       0.88 m/s AV Area (Vmean):   1.84 cm      PV Peak grad:  3.1 mmHg AV Area (VTI):     2.30 cm AV Vmax:           209.00 cm/s AV Vmean:          143.000 cm/s AV VTI:            0.351 m AV Peak Grad:      17.5 mmHg AV Mean Grad:      10.0 mmHg LVOT Vmax:         187.00 cm/s LVOT Vmean:        131.000 cm/s LVOT VTI:          0.401 m LVOT/AV VTI ratio: 1.14 AR Vena Contracta: 0.30 cm   AORTA Ao Root diam: 2.40 cm Ao Asc diam:  2.70 cm Ao Arch diam: 3.0 cm  MITRAL VALVE  TRICUSPID VALVE MV Area (PHT): 2.62 cm     TR Peak grad:   34.1 mmHg MV Mean grad:  6.0 mmHg     TR Vmax:        292.00 cm/s MV Decel Time: 290 msec MV E velocity: 184.00 cm/s  SHUNTS MV A velocity: 133.00 cm/s  Systemic VTI:  0.40 m MV E/A ratio:  1.38         Systemic Diam: 1.60 cm  Dina Rich MD Electronically signed by Dina Rich MD Signature Date/Time: 02/17/2023/2:51:58 PM       Final     *Note: Due to a large number of results and/or encounters for the requested time period, some results have not been displayed. A complete set of results can be found in Results Review.    Scheduled Meds:  acyclovir  400 mg Oral BID   apixaban  5 mg Oral BID   aspirin EC  81 mg Oral QHS   gabapentin  600 mg Oral TID   levothyroxine  100 mcg Oral Q0600   metoprolol succinate  12.5 mg Oral Daily    midodrine  10 mg Oral TID WC   pantoprazole  40 mg Oral Daily   pravastatin  40 mg Oral QPM   sertraline  25 mg Oral Daily   sodium chloride flush  3 mL Intravenous Q12H   triamcinolone ointment  1 Application Topical BID   Continuous Infusions:  amiodarone 30 mg/hr (02/19/23 0423)   magnesium sulfate bolus IVPB 2 g (02/19/23 0830)   PRN Meds:.acetaminophen **OR** acetaminophen, metoprolol tartrate, ondansetron, polyethylene glycol, prochlorperazine  Assessment & Plan Zoe [name] is a 89 y.o. female patient with new onset paroxysmal Afib who has converted to NSR.  New onset Afib RVR, currently NSR Suspect chest pain was related to Afib RVR and severe anxiety D/C amio gtt as she is in NSR. I will not start her on PO amio given her age and frailty. Continue Toprol XL 12.5 mg qHS, can increase to BID if BP/HR tolerate. Eliquis dosing by pharmacy.  LVEF remains preserved Lopressor 5 mg IV TID PRN for sustained HR >115   Chronic heart failure with preserved ejection fraction  Patient is presently well compensated.   She was advised to furosemide only on a as needed basis at last office visit given hyponatremia when she takes it every day.     TAVR 09/03/2018: Edwards Sapien 3 THV (size 23 mm, model # 9600TFX, serial # [id number]) No aortic stenosis or regurgitation per TTE 02/17/2023   Primary hypertension Blood pressure is well-controlled.    Asymptomatic bilateral carotid artery stenosis Left carotid artery stenosis has been present, she will need annual surveillance only.     Hypercholesteremia Continue statin  Patient is stable for d/c from cardiovascular standpoint.    Clotilde Dieter, DO 02/19/2023, 8:49 AM Office: [phone] Fax: [phone] Pager: [phone]

## 2023-02-19 NOTE — Evaluation (Signed)
Physical Therapy Evaluation/ Discharge Patient Details Name: Zoe [name] MRN: [id number] DOB: 1934-05-19 Today's Date: 02/19/2023  History of Present Illness  89 yo female admitted 4/27 with chest pain with electrolyte derangements with Afib RVR. PMHx:dCHF, GERD, hypothyroidism, anemia, HLD, HTN, AS s/p TAVR, neuropathy, multiple myeloma, breast CA  Clinical Impression  PT pleasant and eager to mobilize and return home. Family present and report PCA hired for a couple times a week at D/C. Pt's grandson lives with her while attending [employer] and provides intermittent help. Pt with HR 63-74 during session without SOB, Chest pain or other symptoms. Pt at baseline functional mobility ability to perform transfers, pericare, and gait without assist. Pt educated for sequence of step or curb with RW and pt agreeable to no further acute therapy needs. Will sign off and defer to OPPT which pt was participating in PTA.   Encouraged daily ambulation acutely and up to [facility] and bathroom.      Recommendations for follow up therapy are one component of a multi-disciplinary discharge planning process, led by the attending physician.  Recommendations may be updated based on patient status, additional functional criteria and insurance authorization.  Follow Up Recommendations       Assistance Recommended at Discharge PRN  Patient can return home with the following  Assistance with cooking/housework;Assist for transportation;Help with stairs or ramp for entrance    Equipment Recommendations None recommended by PT  Recommendations for Other Services       Functional Status Assessment Patient has not had a recent decline in their functional status     Precautions / Restrictions Precautions Precautions: Fall      Mobility  Bed Mobility Overal bed mobility: Modified Independent             General bed mobility comments: bed flat    Transfers Overall transfer level: Modified independent                       Ambulation/Gait Ambulation/Gait assistance: Modified independent (Device/Increase time) Gait Distance (Feet): 300 Feet Assistive device: Rolling walker (2 wheels) Gait Pattern/deviations: Step-through pattern, Decreased stride length, Trunk flexed   Gait velocity interpretation: 1.31 - 2.62 ft/sec, indicative of limited community ambulator      Stairs Stairs: Yes Stairs assistance: Min guard Stair Management: One rail Left, Step to pattern, Forwards, Backwards Number of Stairs: 1 General stair comments: pt with 4" step at home, attempted 8" step with rail forward and unable to step up onto step. With RW backward with cues for sequence pt able to step up step without physical assist  Wheelchair Mobility    Modified Rankin (Stroke Patients Only)       Balance                                             Pertinent Vitals/Pain Pain Assessment Pain Assessment: No/denies pain    Home Living Family/patient expects to be discharged to:: Private residence Living Arrangements: Other relatives Available Help at Discharge: Family;Available PRN/intermittently Type of Home: House Home Access: Stairs to enter   [employer] of Steps: 1   Home Layout: One level Home Equipment: Shower seat;Cane - single point;Rolling Walker (2 wheels)      Prior Function Prior Level of Function : Needs assist             Mobility Comments:  RW for ambulation, grandson provide transportation ADLs Comments: grandson assists with housework     Hand Dominance        Extremity/Trunk Assessment   Upper Extremity Assessment Upper Extremity Assessment: Generalized weakness    Lower Extremity Assessment Lower Extremity Assessment: Generalized weakness    Cervical / Trunk Assessment Cervical / Trunk Assessment: Kyphotic  Communication   Communication: No difficulties  Cognition Arousal/Alertness: Awake/alert Behavior During  Therapy: WFL for tasks assessed/performed Overall Cognitive Status: Within Functional Limits for tasks assessed                                          General Comments      Exercises     Assessment/Plan    PT Assessment All further PT needs can be met in the next venue of care  PT Problem List Decreased activity tolerance;Decreased balance;Decreased mobility       PT Treatment Interventions      PT Goals (Current goals can be found in the Care Plan section)  Acute Rehab PT Goals PT Goal Formulation: All assessment and education complete, DC therapy    Frequency       Co-evaluation               AM-PAC PT "6 Clicks" Mobility  Outcome Measure Help needed turning from your back to your side while in a flat bed without using bedrails?: None Help needed moving from lying on your back to sitting on the side of a flat bed without using bedrails?: None Help needed moving to and from a bed to a chair (including a wheelchair)?: None Help needed standing up from a chair using your arms (e.g., wheelchair or bedside chair)?: None Help needed to walk in hospital room?: None Help needed climbing 3-5 steps with a railing? : A Little 6 Click Score: 23    End of Session   Activity Tolerance: Patient tolerated treatment well Patient left: in chair;with call bell/phone within reach;with family/visitor present Nurse Communication: Mobility status PT Visit Diagnosis: Other abnormalities of gait and mobility (R26.89)    Time: 1610-9604 PT Time Calculation (min) (ACUTE ONLY): 26 min   Charges:   PT Evaluation $PT Eval Low Complexity: 1 Low          Lilibeth Opie P, PT Acute Rehabilitation Services Office: [phone]   Enedina Finner Almarosa Bohac 02/19/2023, 1:39 PM

## 2023-02-19 NOTE — Discharge Summary (Signed)
Physician Discharge Summary   Patient: Zoe [name] MRN: [id number] DOB: 11-Oct-1934  Admit date:     02/17/2023  Discharge date: 02/19/23  Discharge Physician: Zoe [name]   PCP: Zoe [name]   Recommendations at discharge:   Follow-up with PCP in 1 week Follow-up with cardiologist in 3 weeks  Discharge Diagnoses: Principal Problem:   Chest pain, rule out acute myocardial infarction Active Problems:   Hyponatremia   Microcytic anemia   S/P TAVR (transcatheter aortic valve replacement)   Hypothyroidism   Hypertension   Hyperlipidemia   GERD (gastroesophageal reflux disease)   Multiple myeloma not having achieved remission (HCC)   Chemotherapy-induced neuropathy [facility])   Hospital Course: Patient with the PMH of GERD, hypothyroidism, HLD, HTN, severe aortic stenosis SP TAVR, multiple myeloma on actively chemotherapy, breast cancer, chronic diastolic CHF presented to hospital with complaints of shortness of breath and chest pain. Found to have A-fib with RVR as well as minimally elevated troponin.  She is also hypotensive.  Cardiology following.  She was started on amiodarone drip patient chemically cardioverted to normal sinus rhythm.  Cardiologist evaluated the patient and found that the patient's ejection fraction was preserved.  She recommended that the patient not be started on p.o. amiodarone but discontinued carvedilol and place the patient on Toprol-XL and Eliquis for anticoagulation.  Patient was initially found to be hypotensive and she was having A-fib with RVR but subsequently this has resolved blood pressure is stabilized.  I have discontinued the patient home dose of losartan as her blood pressure is currently stable in the 120s over low 60s.  Patient is to follow-up with PCP in 1 week at that time blood pressure can be checked and if necessary medication can be reintroduced with the PCP.  Patient also needs to follow-up with a cardiologist in 3  weeks.       Consultants: Cardiology Procedures performed: None Disposition: Home Diet recommendation:  Cardiac diet DISCHARGE MEDICATION:   Follow-up Information     [facility], Zoe [name] Follow up in 1 week(s).   Specialty: Rheumatology Contact information: [address] [place] [address] [phone]         Zoe Dieter, DO Follow up in 3 week(s).   Specialty: Cardiology Contact information: [address] [place] [address] [phone]                Discharge Exam: Zoe [name] Weights   02/17/23 0841 02/18/23 0500  Weight: 72.6 kg 75.1 kg   Patient is alert oriented not in distress CVS: S1-S2 positive Respiratory: Bilateral clear and equal breath sounds.  Condition at discharge: fair  The results of significant diagnostics from this hospitalization (including imaging, microbiology, ancillary and laboratory) are listed below for reference.   Imaging Studies: CT ABDOMEN PELVIS WO CONTRAST  Result Date: 02/17/2023 CLINICAL DATA:  Abdominal pain, acute, nonlocalized abd pain, distension, EXAM: CT ABDOMEN AND PELVIS WITHOUT CONTRAST TECHNIQUE: Multidetector CT imaging of the abdomen and pelvis was performed following the standard protocol without IV contrast. RADIATION DOSE REDUCTION: This exam was performed according to the departmental dose-optimization program which includes automated exposure control, adjustment of the mA and/or kV according to patient size and/or use of iterative reconstruction technique. COMPARISON:  MR 09/23/2022 and previous FINDINGS: Lower chest: Post TAVR. Small pleural effusions. Dependent atelectasis posteriorly in both lower lobes. 9 mm nodule in the posterior right lower lobe (Im5,Se4) , new since 08/15/2018. Hepatobiliary: 6.7 cm low-attenuation lesion in hepatic segment 8 corresponding  to hemangioma on MR 09/23/2022. No new liver lesion or biliary ductal dilatation. Gallbladder incompletely  distended. No calcified gallstones. Pancreas: Unremarkable. No pancreatic ductal dilatation or surrounding inflammatory changes. Spleen: Normal in size without focal abnormality. Adrenals/Urinary Tract: No adrenal mass. Symmetric renal contours without urolithiasis. No hydronephrosis. Urinary bladder partially distended. Stomach/Bowel: Stomach is nondistended, unremarkable. Small bowel decompressed. Calcified appendicoliths without appendiceal wall thickening or regional inflammatory change. The colon is partially distended by gas and fecal material, without acute finding. Vascular/Lymphatic: Coarse aortoiliac calcified atheromatous plaque without aneurysm. No abdominal or pelvic adenopathy. Reproductive: Status post hysterectomy. No adnexal masses. Other: Bilateral pelvic phleboliths.  No ascites.  No free air. Musculoskeletal: Post vertebral augmentation T8 and T12. Multilevel lumbar spondylitic change with grade 1 anterolisthesis L4-5, presumably related to advanced facet DJD. No fracture or worrisome bone lesion. IMPRESSION: 1. No acute findings. 2. 9 mm right lower lobe pulmonary nodule. Per Fleischner Society Guidelines, recommend prompt non-contrast Chest CT for further evaluation. These guidelines do not apply to immunocompromised patients and patients with cancer. Follow up in patients with significant comorbidities as clinically warranted. For lung cancer screening, adhere to Lung-RADS guidelines. Reference: Radiology. 2017; 284(1):228-43. 3. Aortic Atherosclerosis (ICD10-I70.0). Electronically Signed   By: Corlis Leak M.D.   On: 02/17/2023 14:56   ECHOCARDIOGRAM COMPLETE  Result Date: 02/17/2023    ECHOCARDIOGRAM REPORT   Patient Name:   Zoe [name] Date of Exam: 02/17/2023 [name] Rec #:  [id number]           Height:       63.0 in Accession #:    [id number]          Weight:       160.0 lb Date of Birth:  Jul 30, 1934            BSA:          1.759 m Patient Age:    89 years            BP:            162/76 mmHg Patient Gender: F                   HR:           76 bpm. Exam Location:  Inpatient Procedure: 2D Echo, Cardiac Doppler, Color Doppler and Strain Analysis Indications:    R07.9* Chest pain, unspecified  History:        Patient has prior history of Echocardiogram examinations, most                 recent 04/03/2022. CHF, Multiple, Aortic Valve Disease; Risk                 Factors:Hypertension, Dyslipidemia and Non-Smoker.  Sonographer:    Dondra Prader RVT RCS Referring Phys: [id number] [facility] CUSTOVIC  Sonographer Comments: Global longitudinal strain was attempted. IMPRESSIONS  1. Left ventricular ejection fraction, by estimation, is 55 to 60%. The left ventricle has normal function. The left ventricle has no regional wall motion abnormalities. There is mild left ventricular hypertrophy. Left ventricular diastolic parameters are consistent with Grade II diastolic dysfunction (pseudonormalization). Elevated left atrial pressure.  2. Right ventricular systolic function is normal. The right ventricular size is normal. There is mildly elevated pulmonary artery systolic pressure.  3. Left atrial size was severely dilated.  4. The mitral valve is abnormal. Mild mitral valve regurgitation. Moderate mitral stenosis.  5. The tricuspid valve is abnormal.  6. Edwards Sapien 3 THV  size 23 mm is in the AV position. . The aortic valve has been repaired/replaced. Aortic valve regurgitation is not visualized. No aortic stenosis is present.  7. The inferior vena cava is dilated in size with >50% respiratory variability, suggesting right atrial pressure of 8 mmHg. FINDINGS  Left Ventricle: Left ventricular ejection fraction, by estimation, is 55 to 60%. The left ventricle has normal function. The left ventricle has no regional wall motion abnormalities. Global longitudinal strain performed but not reported based on interpreter judgement due to suboptimal tracking. The left ventricular internal cavity size was normal in size.  There is mild left ventricular hypertrophy. Left ventricular diastolic parameters are consistent with Grade II diastolic dysfunction (pseudonormalization). Elevated left atrial pressure. Right Ventricle: The right ventricular size is normal. Right vetricular wall thickness was not well visualized. Right ventricular systolic function is normal. There is mildly elevated pulmonary artery systolic pressure. The tricuspid regurgitant velocity  is 2.92 m/s, and with an assumed right atrial pressure of 8 mmHg, the estimated right ventricular systolic pressure is 42.1 mmHg. Left Atrium: Left atrial size was severely dilated. Right Atrium: Right atrial size was normal in size. Pericardium: There is no evidence of pericardial effusion. Mitral Valve: The mitral valve is abnormal. There is mild thickening of the mitral valve leaflet(s). There is mild calcification of the mitral valve leaflet(s). Mild mitral annular calcification. Mild mitral valve regurgitation. Moderate mitral valve stenosis. The mean mitral valve gradient is 6.0 mmHg. Tricuspid Valve: The tricuspid valve is abnormal. Tricuspid valve regurgitation is mild . No evidence of tricuspid stenosis. Aortic Valve: Edwards Sapien 3 THV size 23 mm is in the AV position. The aortic valve has been repaired/replaced. Aortic valve regurgitation is not visualized. No aortic stenosis is present. Aortic valve mean gradient measures 10.0 mmHg. Aortic valve peak gradient measures 17.5 mmHg. Aortic valve area, by VTI measures 2.30 cm. Pulmonic Valve: The pulmonic valve was not well visualized. Pulmonic valve regurgitation is mild. No evidence of pulmonic stenosis. Aorta: The aortic root is normal in size and structure. Venous: The inferior vena cava is dilated in size with greater than 50% respiratory variability, suggesting right atrial pressure of 8 mmHg. IAS/Shunts: No atrial level shunt detected by color flow Doppler.  LEFT VENTRICLE PLAX 2D LVIDd:         3.80 cm    Diastology LVIDs:         2.70 cm   LV e' medial:    5.52 cm/s LV PW:         1.20 cm   LV E/e' medial:  33.3 LV IVS:        1.20 cm   LV e' lateral:   5.56 cm/s LVOT diam:     1.60 cm   LV E/e' lateral: 33.1 LV SV:         81 LV SV Index:   46 LVOT Area:     2.01 cm  RIGHT VENTRICLE             IVC RV Basal diam:  4.00 cm     IVC diam: 2.30 cm RV Mid diam:    3.10 cm RV S prime:     13.30 cm/s TAPSE (M-mode): 2.5 cm LEFT ATRIUM             Index        RIGHT ATRIUM           Index LA diam:        4.20 cm 2.39  cm/m   RA Area:     16.60 cm LA Vol (A2C):   93.4 ml 53.11 ml/m  RA Volume:   38.50 ml  21.89 ml/m LA Vol (A4C):   88.6 ml 50.38 ml/m LA Biplane Vol: 92.3 ml 52.48 ml/m  AORTIC VALVE                     PULMONIC VALVE AV Area (Vmax):    1.80 cm      PV Vmax:       0.88 m/s AV Area (Vmean):   1.84 cm      PV Peak grad:  3.1 mmHg AV Area (VTI):     2.30 cm AV Vmax:           209.00 cm/s AV Vmean:          143.000 cm/s AV VTI:            0.351 m AV Peak Grad:      17.5 mmHg AV Mean Grad:      10.0 mmHg LVOT Vmax:         187.00 cm/s LVOT Vmean:        131.000 cm/s LVOT VTI:          0.401 m LVOT/AV VTI ratio: 1.14 AR Vena Contracta: 0.30 cm  AORTA Ao Root diam: 2.40 cm Ao Asc diam:  2.70 cm Ao Arch diam: 3.0 cm MITRAL VALVE                TRICUSPID VALVE MV Area (PHT): 2.62 cm     TR Peak grad:   34.1 mmHg MV Mean grad:  6.0 mmHg     TR Vmax:        292.00 cm/s MV Decel Time: 290 msec MV E velocity: 184.00 cm/s  SHUNTS MV A velocity: 133.00 cm/s  Systemic VTI:  0.40 m MV E/A ratio:  1.38         Systemic Diam: 1.60 cm Dina Rich MD Electronically signed by Dina Rich MD Signature Date/Time: 02/17/2023/2:51:58 PM    Final    DG Chest 2 View  Result Date: 02/17/2023 CLINICAL DATA:  Chest pain EXAM: CHEST - 2 VIEW COMPARISON:  Chest x-ray May 15, 2022 FINDINGS: The patient is status post TAVR. Stable cardiomegaly. The hila and mediastinum are unchanged. No pneumothorax. No nodules or masses.  No focal infiltrates. No overt edema. The patient is status post vertebroplasties at 2 levels. No acute bony changes. Possible tiny pleural effusions. IMPRESSION: 1. Possible tiny pleural effusions. No other acute abnormalities. 2. Stable cardiomegaly. Electronically Signed   By: Gerome Sam III M.D.   On: 02/17/2023 09:41    Microbiology: Results for orders placed or performed in visit on 08/08/22  Urine Culture     Status: Abnormal   Collection Time: 08/08/22 11:15 AM   Specimen: Urine, Clean Catch  Result Value Ref Range Status   Specimen Description   Final    URINE, CLEAN CATCH Performed at [facility], [address], [place], [place] [address]    Special Requests   Final    NONE Performed at [facility], [address], [place], [place] [address]    Culture 50,000 COLONIES/mL ESCHERICHIA COLI (A)  Final   Report Status 08/10/2022 FINAL  Final   Organism ID, Bacteria ESCHERICHIA COLI (A)  Final      Susceptibility   Escherichia coli - MIC*    AMPICILLIN  8 SENSITIVE Sensitive     CEFAZOLIN <=4 SENSITIVE Sensitive     CEFEPIME <=0.12 SENSITIVE Sensitive     CEFTRIAXONE <=0.25 SENSITIVE Sensitive     CIPROFLOXACIN <=0.25 SENSITIVE Sensitive     GENTAMICIN <=1 SENSITIVE Sensitive     IMIPENEM <=0.25 SENSITIVE Sensitive     NITROFURANTOIN <=16 SENSITIVE Sensitive     TRIMETH/SULFA <=20 SENSITIVE Sensitive     AMPICILLIN/SULBACTAM 4 SENSITIVE Sensitive     PIP/TAZO <=4 SENSITIVE Sensitive     * 50,000 COLONIES/mL ESCHERICHIA COLI    Labs: CBC: Recent Labs  Lab 02/17/23 0922 02/18/23 0100 02/19/23 0121  WBC 8.9 8.0 9.9  NEUTROABS 6.4  --   --   HGB 12.1 11.3* 11.2*  HCT 35.3* 32.3* 32.7*  MCV 87.4 85.7 87.9  PLT 278 276 320   Basic Metabolic Panel: Recent Labs  Lab 02/17/23 1955 02/18/23 0100 02/18/23 0551 02/18/23 1018 02/19/23 0121  NA 129* 128* 128* 126* 126*  K 3.2* 3.4* 3.3* 3.8 4.5  CL 91* 93* 92*  92* 95*  CO2 24 25 23 24 25   GLUCOSE 106* 123* 126* 165* 121*  BUN 9 14 15 16  27*  CREATININE 0.70 0.73 0.76 0.79 0.90  CALCIUM 8.6* 8.1* 8.1* 8.1* 8.1*  MG  --   --   --  1.7 1.8   Liver Function Tests: Recent Labs  Lab 02/17/23 0922 02/19/23 0121  AST 17 12*  ALT 16 13  ALKPHOS 64 62  BILITOT 1.2 0.4  PROT 5.8* 4.7*  ALBUMIN 3.1* 2.5*   CBG: No results for input(s): "GLUCAP" in the last 168 hours.  Discharge time spent: greater than 30 minutes.  Signed: Harold Hedge, MD [facility] 02/19/2023

## 2023-02-20 ENCOUNTER — Telehealth: Payer: Self-pay | Admitting: Physician Assistant

## 2023-02-20 ENCOUNTER — Other Ambulatory Visit: Payer: Self-pay | Admitting: Physician Assistant

## 2023-02-20 ENCOUNTER — Inpatient Hospital Stay: Payer: Medicare Other

## 2023-02-20 ENCOUNTER — Inpatient Hospital Stay: Payer: Medicare Other | Admitting: Physician Assistant

## 2023-02-20 NOTE — Telephone Encounter (Signed)
Reached out to patient to make her aware of the appointments this Friday, patient aware of date and time of appointments.

## 2023-02-20 NOTE — Telephone Encounter (Signed)
Reached out to schedule , left voicemail.

## 2023-02-21 ENCOUNTER — Telehealth: Payer: Self-pay

## 2023-02-21 NOTE — Telephone Encounter (Signed)
Location of hospitalization: [facility] Reason for hospitalization: Chest Pain Date of discharge: 02/19/2023 Date of first communication with patient: today Person contacting patient: Me Current symptoms: None Do you understand why you were in the Hospital: Yes Questions regarding discharge instructions: None Where were you discharged to: Home Medications reviewed: Yes Allergies reviewed: Yes Dietary changes reviewed: Yes. Discussed low fat and low salt diet.  Referals reviewed: NA Activities of Daily Living: Able to with mild limitations Any transportation issues/concerns: None Any patient concerns: None Confirmed importance & date/time of Follow up appt: Yes Confirmed with patient if condition begins to worsen call. Pt was given the office number and encouraged to call back with questions or concerns: Yes

## 2023-02-22 NOTE — Progress Notes (Signed)
[facility] OFFICE PROGRESS NOTE  Associates, [facility] [address] [place] [address]  DIAGNOSIS: Multiple myeloma  PRIOR THERAPY: Hematological/Oncological History # IgG Lambda Multiple Myeloma  06/14/2022: establish care with [name] due to anemia. Labs showed M protein 4.9, Kappa 7.2, Lambda 10.8, ratio 0.67 07/13/2022: Bmbx showed Lambda restricted plasma cell neoplasm involving approximately 60% of the cellular marrow by IHC on the biopsy.  08/01/2022: Cycle 1 Day 1 of VRd chemotherapy. (Holding revlimid initially) 08/21/2022:  Cycle 2 Day 1 of VRd chemotherapy. (Holding revlimid) 09/04/2022: Cycle 3 Day 1 of VRd chemotherapy. Started revlimid 10/03/2022: Cycle 4 Day 1 of VRd chemotherapy 10/31/2022: Cycle 5 Day 1 of VRd chemotherapy 11/20/2022: Cycle 6 Day 1 of VRd chemotherapy 12/11/2022: Cycle 7 Day 1 of VRd chemotherapy 01/02/2023: Cycle 8 Day 1 of VRd chemotherapy 01/22/2023: Cycle 9 Day 1 of VRd chemotherapy 02/23/2023: Day 1 Cycle 1 of Darzalex, Revlimid, and decadron     CURRENT THERAPY: Darzalex, Revlimid, and decadron   INTERVAL HISTORY: [name] B Kosteleck[place] 89 y.o. female returns to the clinic today for a follow-up visit accompanied by her daughter-in-law..  The patient was last seen by Dr. Leonides Schanz on 02/05/2023.  At that time she was undergoing treatment with VRD.  She had called 07/15/2023 endorsing pleuritic rash. Therefore Dr. Leonides Schanz discontinued her velcade and changed her treatment to Darzalex and she was instructed to use oatmeal-based lotion for itching and p.o. Benadryl as well.  She also was given a prescription for triamcinolone cream at her last appointment.   Regarding the rash, her daughter-in-law states that this looks a lot better on her legs  She has some residual dry skin.  She has not needed to use her steroid cream as much.  She presented to the ER on 02/17/23 with the chief complaint of chest pain. She has a history of  diastolic CHF and aortic stenosis s/p TAVR. She was found to have Afib with RVR. She was started on amidoarone and chemically convereted to NSR. She is curretnly taking amiodarone, metoprolol and eliquis.  She is wondering if she needs to continue her 81 mg aspirin.  She is scheduled to see her cardiologist on 5/9.  She is wondering why she had hyponatremia in the hospital.  Per chart review, it looks like they thought this could be secondary to volume overload.  To her knowledge, she was not told that she needed to be on a fluid restriction. Since being discharged, she is feeling fairly well. Today, she denies fevers, chills, or night sweats.  It looks like she lost weight but reports her appetite is good.  May be fluid weight. Her shortness of breath is "pretty good". Denies cough. Denies nausea, vomiting, or diarrhea.  She sometimes has constipation.  Her last bowel movement was this morning.  She is going to try using Senokot if needed for constipation.  She denies signs or symptoms of infection including sore throat, nasal congestion, skin infection, or dysuria. She denies any abnormal bleeding. She is here for evaluation and repeat blood work.    MEDICAL HISTORY: Past Medical History:  Diagnosis Date   Arthritis    some - per patient   Breast cancer (HCC)    breast cancer / left    Cataract    bilat    GERD (gastroesophageal reflux disease)    History of kidney stones    Hyperlipidemia    Hypertension    Hypothyroidism    Macular degeneration  Left   S/P TAVR (transcatheter aortic valve replacement) 09/03/2018   23 mm Edwards Sapien 3 transcatheter heart valve placed via percutaneous right transfemoral approach    Severe aortic stenosis    Stress incontinence    Thyroid disease    Tinnitus     ALLERGIES:  is allergic to penicillins and sulfa antibiotics.  MEDICATIONS:  Current Outpatient Medications  Medication Sig Dispense Refill   acyclovir (ZOVIRAX) 400 MG tablet Take 400  mg by mouth 2 (two) times daily. Pt takes 1 tablet (400 mg total) by mouth 2 (two) times daily.     apixaban (ELIQUIS) 5 MG TABS tablet Take 1 tablet (5 mg total) by mouth 2 (two) times daily. 60 tablet 0   Artificial Tear Solution (SOOTHE XP) SOLN Place 1 drop into both eyes every evening.     Cholecalciferol (VITAMIN D3) 50 MCG (2000 UT) capsule Take 1 capsule (2,000 Units total) by mouth daily.     [employer] VITAMIN B12 1000 MCG tablet TAKE 1 TABLET BY MOUTH EVERY DAY 90 tablet 1   dapagliflozin propanediol (FARXIGA) 10 MG TABS tablet TAKE 1 TABLET BY MOUTH EVERY DAY 90 tablet 2   dexamethasone (DECADRON) 4 MG tablet Take 5 tablets (20 mg total) by mouth once a week. Take 20 mg (5 tablets) on day of multiple myeloma treatment 20 tablet 5   esomeprazole (NEXIUM) 20 MG capsule Take 20 mg by mouth daily as needed (Heartburn).     gabapentin (NEURONTIN) 300 MG capsule Take 3 capsules (900 mg total) by mouth 2 (two) times daily. 180 capsule 1   hydrocortisone cream 1 % Apply 1 Application topically 2 (two) times daily. 120 g 0   lenalidomide (REVLIMID) 25 MG capsule Take 1 capsule (25 mg total) by mouth daily. Take for 14 days,then none for 7 days.Repeat every 21 days. Auth # [id number]  Date obtained 02/08/23 14 capsule 0   levothyroxine (SYNTHROID, LEVOTHROID) 100 MCG tablet Take 100 mcg by mouth daily before breakfast.  2   metoprolol succinate (TOPROL-XL) 25 MG 24 hr tablet Take 0.5 tablets (12.5 mg total) by mouth daily. 15 tablet 0   OVER THE COUNTER MEDICATION Take 1 tablet by mouth daily. AREDS     polyethylene glycol (MIRALAX / GLYCOLAX) 17 g packet Take 17 g by mouth daily as needed for mild constipation.     potassium chloride (KLOR-CON M10) 10 MEQ tablet Take 2 tablets (20 mEq total) by mouth 2 (two) times daily. 180 tablet 1   pravastatin (PRAVACHOL) 40 MG tablet Take 40 mg by mouth every evening.     triamcinolone ointment (KENALOG) 0.5 % Apply 1 Application topically 2 (two) times daily. 30 g 0    aspirin EC 81 MG tablet Take 81 mg by mouth at bedtime. Swallow whole.     prochlorperazine (COMPAZINE) 10 MG tablet Take 1 tablet (10 mg total) by mouth every 6 (six) hours as needed for nausea or vomiting. (Patient not taking: Reported on 02/18/2023) 30 tablet 0   No current facility-administered medications for this visit.    SURGICAL HISTORY:  Past Surgical History:  Procedure Laterality Date   ABDOMINAL HYSTERECTOMY  1970's   BACK SURGERY     BREAST LUMPECTOMY  12/1998   lumpectomy   CARDIAC CATHETERIZATION     EYE SURGERY     cataract surgery bilat    INTRAOPERATIVE TRANSTHORACIC ECHOCARDIOGRAM N/A 09/03/2018   Procedure: INTRAOPERATIVE TRANSTHORACIC ECHOCARDIOGRAM;  Surgeon: Kathleene Hazel, MD;  Location: [facility] OR;  Service: Open Heart Surgery;  Laterality: N/A;   KYPHOPLASTY N/A 09/07/2022   Procedure: THORACIC EIGHT KYPHOPLASTY;  Surgeon: Venita Lick, MD;  Location: [facility] OR;  Service: Orthopedics;  Laterality: N/A;  1 hr Local with IV [facility] 3 C-Bed   LITHOTRIPSY     Right total knee     2018 Dr. Charlann Boxer   RIGHT/LEFT HEART CATH AND CORONARY ANGIOGRAPHY N/A 08/06/2018   Procedure: RIGHT/LEFT HEART CATH AND CORONARY ANGIOGRAPHY;  Surgeon: Yates Decamp, MD;  Location: [facility] INVASIVE CV LAB;  Service: Cardiovascular;  Laterality: N/A;   THYROIDECTOMY, PARTIAL  1975   TONSILLECTOMY     as a child - patient not sure of exact date   TOTAL KNEE ARTHROPLASTY Left 03/13/2016   Procedure: TOTAL KNEE ARTHROPLASTY;  Surgeon: Durene Romans, MD;  Location: [facility] ORS;  Service: Orthopedics;  Laterality: Left;   TOTAL KNEE ARTHROPLASTY Right 06/18/2017   Procedure: RIGHT TOTAL KNEE ARTHROPLASTY;  Surgeon: Durene Romans, MD;  Location: [facility] ORS;  Service: Orthopedics;  Laterality: Right;   TRANSCATHETER AORTIC VALVE REPLACEMENT, TRANSFEMORAL N/A 09/03/2018   Procedure: TRANSCATHETER AORTIC VALVE REPLACEMENT, TRANSFEMORAL;  Surgeon: Kathleene Hazel, MD;  Location: [facility] OR;  Service: Open  Heart Surgery;  Laterality: N/A;    REVIEW OF SYSTEMS:   Review of Systems  Constitutional: Stated for weight loss.  Negative for appetite change, chills, fatigue, or fever.  HENT: Negative for mouth sores, nosebleeds, sore throat and trouble swallowing.   Eyes: Negative for eye problems and icterus.  Respiratory: Positive for improved shortness of breath.  Negative for cough, hemoptysis and wheezing.   Cardiovascular: Positive for stable bilateral lower extremity ankle swelling.  Negative for chest pain  Gastrointestinal: Negative for abdominal pain, constipation (none at this time), diarrhea, nausea and vomiting.  Genitourinary: Negative for bladder incontinence, difficulty urinating, dysuria, frequency and hematuria.   Musculoskeletal: Negative for back pain, gait problem, neck pain and neck stiffness.  Skin: Positive for improving rash and itching. Positive for dry skin Neurological: Negative for dizziness, extremity weakness, gait problem, headaches, light-headedness and seizures.  Hematological: Negative for adenopathy. Does not bruise/bleed easily.  Psychiatric/Behavioral: Negative for confusion, depression and sleep disturbance. The patient is not nervous/anxious.     PHYSICAL EXAMINATION:  Blood pressure 122/66, pulse 77, temperature 98.1 F (36.7 C), temperature source Oral, resp. rate 18, height 5\' 3"  (1.6 m), weight 157 lb (71.2 kg), SpO2 98 %.  ECOG PERFORMANCE STATUS: 1  Physical Exam  Constitutional: Oriented to person, place, and time and well-developed, well-nourished, and in no distress.  HENT:  Head: Normocephalic and atraumatic.  Mouth/Throat: Oropharynx is clear and moist. No oropharyngeal exudate.  Eyes: Conjunctivae are normal. Right eye exhibits no discharge. Left eye exhibits no discharge. No scleral icterus.  Neck: Normal range of motion. Neck supple.  Cardiovascular: Normal rate, regular rhythm, normal heart sounds and intact distal pulses.    Pulmonary/Chest: Effort normal and breath sounds normal. No respiratory distress. No wheezes. No rales.  Abdominal: Soft. Bowel sounds are normal. Exhibits no distension and no mass. There is no tenderness.  Musculoskeletal: Normal range of motion.  Positive for bilateral ankle swelling. Lymphadenopathy:    No cervical adenopathy.  Neurological: Alert and oriented to person, place, and time. Exhibits also wasting.  She was examined in the wheelchair.  Skin: Skin is warm and dry. No rash noted. Not diaphoretic. No erythema. No pallor.  Psychiatric: Mood, memory and judgment normal.  Vitals reviewed.  LABORATORY DATA: Lab Results  Component Value Date  WBC 6.7 02/23/2023   HGB 12.5 02/23/2023   HCT 36.5 02/23/2023   MCV 88.4 02/23/2023   PLT 257 02/23/2023      Chemistry      Component Value Date/Time   NA 131 (L) 02/23/2023 1032   NA 130 (L) 06/19/2022 1012   K 4.2 02/23/2023 1032   CL 97 (L) 02/23/2023 1032   CO2 27 02/23/2023 1032   BUN 15 02/23/2023 1032   BUN 21 06/19/2022 1012   CREATININE 0.63 02/23/2023 1032      Component Value Date/Time   CALCIUM 8.6 (L) 02/23/2023 1032   ALKPHOS 66 02/23/2023 1032   AST 12 (L) 02/23/2023 1032   ALT 9 02/23/2023 1032   BILITOT 0.7 02/23/2023 1032       RADIOGRAPHIC STUDIES:  CT ABDOMEN PELVIS WO CONTRAST  Result Date: 02/17/2023 CLINICAL DATA:  Abdominal pain, acute, nonlocalized abd pain, distension, EXAM: CT ABDOMEN AND PELVIS WITHOUT CONTRAST TECHNIQUE: Multidetector CT imaging of the abdomen and pelvis was performed following the standard protocol without IV contrast. RADIATION DOSE REDUCTION: This exam was performed according to the departmental dose-optimization program which includes automated exposure control, adjustment of the mA and/or kV according to patient size and/or use of iterative reconstruction technique. COMPARISON:  MR 09/23/2022 and previous FINDINGS: Lower chest: Post TAVR. Small pleural effusions.  Dependent atelectasis posteriorly in both lower lobes. 9 mm nodule in the posterior right lower lobe (Im5,Se4) , new since 08/15/2018. Hepatobiliary: 6.7 cm low-attenuation lesion in hepatic segment 8 corresponding to hemangioma on MR 09/23/2022. No new liver lesion or biliary ductal dilatation. Gallbladder incompletely distended. No calcified gallstones. Pancreas: Unremarkable. No pancreatic ductal dilatation or surrounding inflammatory changes. Spleen: Normal in size without focal abnormality. Adrenals/Urinary Tract: No adrenal mass. Symmetric renal contours without urolithiasis. No hydronephrosis. Urinary bladder partially distended. Stomach/Bowel: Stomach is nondistended, unremarkable. Small bowel decompressed. Calcified appendicoliths without appendiceal wall thickening or regional inflammatory change. The colon is partially distended by gas and fecal material, without acute finding. Vascular/Lymphatic: Coarse aortoiliac calcified atheromatous plaque without aneurysm. No abdominal or pelvic adenopathy. Reproductive: Status post hysterectomy. No adnexal masses. Other: Bilateral pelvic phleboliths.  No ascites.  No free air. Musculoskeletal: Post vertebral augmentation T8 and T12. Multilevel lumbar spondylitic change with grade 1 anterolisthesis L4-5, presumably related to advanced facet DJD. No fracture or worrisome bone lesion. IMPRESSION: 1. No acute findings. 2. 9 mm right lower lobe pulmonary nodule. Per Fleischner Society Guidelines, recommend prompt non-contrast Chest CT for further evaluation. These guidelines do not apply to immunocompromised patients and patients with cancer. Follow up in patients with significant comorbidities as clinically warranted. For lung cancer screening, adhere to Lung-RADS guidelines. Reference: Radiology. 2017; 284(1):228-43. 3. Aortic Atherosclerosis (ICD10-I70.0). Electronically Signed   By: Corlis Leak M.D.   On: 02/17/2023 14:56   ECHOCARDIOGRAM COMPLETE  Result Date:  02/17/2023    ECHOCARDIOGRAM REPORT   Patient Name:   Zoe [name] Date of Exam: 02/17/2023 Medical Rec #:  [id number]           Height:       63.0 in Accession #:    [id number]          Weight:       160.0 lb Date of Birth:  November 05, 1933            BSA:          1.759 m Patient Age:    89 years  BP:           162/76 mmHg Patient Gender: F                   HR:           76 bpm. Exam Location:  Inpatient Procedure: 2D Echo, Cardiac Doppler, Color Doppler and Strain Analysis Indications:    R07.9* Chest pain, unspecified  History:        Patient has prior history of Echocardiogram examinations, most                 recent 04/03/2022. CHF, Multiple, Aortic Valve Disease; Risk                 Factors:Hypertension, Dyslipidemia and Non-Smoker.  Sonographer:    Dondra Prader RVT RCS Referring Phys: [id number] [facility] CUSTOVIC  Sonographer Comments: Global longitudinal strain was attempted. IMPRESSIONS  1. Left ventricular ejection fraction, by estimation, is 55 to 60%. The left ventricle has normal function. The left ventricle has no regional wall motion abnormalities. There is mild left ventricular hypertrophy. Left ventricular diastolic parameters are consistent with Grade II diastolic dysfunction (pseudonormalization). Elevated left atrial pressure.  2. Right ventricular systolic function is normal. The right ventricular size is normal. There is mildly elevated pulmonary artery systolic pressure.  3. Left atrial size was severely dilated.  4. The mitral valve is abnormal. Mild mitral valve regurgitation. Moderate mitral stenosis.  5. The tricuspid valve is abnormal.  6. Edwards Sapien 3 THV size 23 mm is in the AV position. . The aortic valve has been repaired/replaced. Aortic valve regurgitation is not visualized. No aortic stenosis is present.  7. The inferior vena cava is dilated in size with >50% respiratory variability, suggesting right atrial pressure of 8 mmHg. FINDINGS  Left Ventricle: Left ventricular  ejection fraction, by estimation, is 55 to 60%. The left ventricle has normal function. The left ventricle has no regional wall motion abnormalities. Global longitudinal strain performed but not reported based on interpreter judgement due to suboptimal tracking. The left ventricular internal cavity size was normal in size. There is mild left ventricular hypertrophy. Left ventricular diastolic parameters are consistent with Grade II diastolic dysfunction (pseudonormalization). Elevated left atrial pressure. Right Ventricle: The right ventricular size is normal. Right vetricular wall thickness was not well visualized. Right ventricular systolic function is normal. There is mildly elevated pulmonary artery systolic pressure. The tricuspid regurgitant velocity  is 2.92 m/s, and with an assumed right atrial pressure of 8 mmHg, the estimated right ventricular systolic pressure is 42.1 mmHg. Left Atrium: Left atrial size was severely dilated. Right Atrium: Right atrial size was normal in size. Pericardium: There is no evidence of pericardial effusion. Mitral Valve: The mitral valve is abnormal. There is mild thickening of the mitral valve leaflet(s). There is mild calcification of the mitral valve leaflet(s). Mild mitral annular calcification. Mild mitral valve regurgitation. Moderate mitral valve stenosis. The mean mitral valve gradient is 6.0 mmHg. Tricuspid Valve: The tricuspid valve is abnormal. Tricuspid valve regurgitation is mild . No evidence of tricuspid stenosis. Aortic Valve: Edwards Sapien 3 THV size 23 mm is in the AV position. The aortic valve has been repaired/replaced. Aortic valve regurgitation is not visualized. No aortic stenosis is present. Aortic valve mean gradient measures 10.0 mmHg. Aortic valve peak gradient measures 17.5 mmHg. Aortic valve area, by VTI measures 2.30 cm. Pulmonic Valve: The pulmonic valve was not well visualized. Pulmonic valve regurgitation is mild. No evidence of pulmonic  stenosis. Aorta: The aortic root is normal in size and structure. Venous: The inferior vena cava is dilated in size with greater than 50% respiratory variability, suggesting right atrial pressure of 8 mmHg. IAS/Shunts: No atrial level shunt detected by color flow Doppler.  LEFT VENTRICLE PLAX 2D LVIDd:         3.80 cm   Diastology LVIDs:         2.70 cm   LV e' medial:    5.52 cm/s LV PW:         1.20 cm   LV E/e' medial:  33.3 LV IVS:        1.20 cm   LV e' lateral:   5.56 cm/s LVOT diam:     1.60 cm   LV E/e' lateral: 33.1 LV SV:         81 LV SV Index:   46 LVOT Area:     2.01 cm  RIGHT VENTRICLE             IVC RV Basal diam:  4.00 cm     IVC diam: 2.30 cm RV Mid diam:    3.10 cm RV S prime:     13.30 cm/s TAPSE (M-mode): 2.5 cm LEFT ATRIUM             Index        RIGHT ATRIUM           Index LA diam:        4.20 cm 2.39 cm/m   RA Area:     16.60 cm LA Vol (A2C):   93.4 ml 53.11 ml/m  RA Volume:   38.50 ml  21.89 ml/m LA Vol (A4C):   88.6 ml 50.38 ml/m LA Biplane Vol: 92.3 ml 52.48 ml/m  AORTIC VALVE                     PULMONIC VALVE AV Area (Vmax):    1.80 cm      PV Vmax:       0.88 m/s AV Area (Vmean):   1.84 cm      PV Peak grad:  3.1 mmHg AV Area (VTI):     2.30 cm AV Vmax:           209.00 cm/s AV Vmean:          143.000 cm/s AV VTI:            0.351 m AV Peak Grad:      17.5 mmHg AV Mean Grad:      10.0 mmHg LVOT Vmax:         187.00 cm/s LVOT Vmean:        131.000 cm/s LVOT VTI:          0.401 m LVOT/AV VTI ratio: 1.14 AR Vena Contracta: 0.30 cm  AORTA Ao Root diam: 2.40 cm Ao Asc diam:  2.70 cm Ao Arch diam: 3.0 cm MITRAL VALVE                TRICUSPID VALVE MV Area (PHT): 2.62 cm     TR Peak grad:   34.1 mmHg MV Mean grad:  6.0 mmHg     TR Vmax:        292.00 cm/s MV Decel Time: 290 msec MV E velocity: 184.00 cm/s  SHUNTS MV A velocity: 133.00 cm/s  Systemic VTI:  0.40 m MV E/A ratio:  1.38         Systemic Diam: 1.60  cm Dina Rich MD Electronically signed by Dina Rich MD  Signature Date/Time: 02/17/2023/2:51:58 PM    Final    DG Chest 2 View  Result Date: 02/17/2023 CLINICAL DATA:  Chest pain EXAM: CHEST - 2 VIEW COMPARISON:  Chest x-ray May 15, 2022 FINDINGS: The patient is status post TAVR. Stable cardiomegaly. The hila and mediastinum are unchanged. No pneumothorax. No nodules or masses. No focal infiltrates. No overt edema. The patient is status post vertebroplasties at 2 levels. No acute bony changes. Possible tiny pleural effusions. IMPRESSION: 1. Possible tiny pleural effusions. No other acute abnormalities. 2. Stable cardiomegaly. Electronically Signed   By: Gerome Sam III M.D.   On: 02/17/2023 09:41     ASSESSMENT/PLAN:    # IgG Lambda Multiple Myeloma  --diagnosis of MM confirmed with bone marrow biopsy showing 60% plasma cells and anemia -- Bone survey shows no lytic lesions, kidney function is within normal limits.  --08/01/2022 was Cycle 1 Day 1 of VRd  -Dr. Leonides Schanz discontinued velcade on 02/05/23 due to rash. She is status post day 15 cycle #9.  -Dr. Leonides Schanz changed the care plan to Daratumumab, Revlimid, and decadron. She is scheduled for day 1 cycle #1 today  -We reviewed side effects of daratumumab.  I have also arrange for her to have a 1 week follow-up visit before starting day 8 cycle 1 with Karena Addison. -Refilled her acyclovir.  Will ensure that she is taking allopurinol.  She received 20 mg of Decadron while in the infusion room today send she did not take that at home today. Plan:  --Labs show white blood cell count 6.7, hemoglobin 12.5, MCV 88.4, and platelets of 257 --last M protein from 12/26/2022 trended down to 0.7 (4.9 prior to start of therapy). Normalized SFLC.  --at each visit will collect CBC, CMP, and LDH with monthly restaging labs SPEP and SFLC. Recently had meyloma labs performed 2 weeks ago on 02/05/23 --RTC in 1 weeks with interval weekly treatment.  -If in information on Darzalex -Refilled acyclovir.   # Rash -- Rash is not  painful or itchy. -- Prescribed triamcinolone ointment to see if this helps with her rash. -Rash has improved at this time.  She has some residual dry skin for which she will use lotion.  She has triamcinolone cream if needed which she has not needed to take at this time. -Mention that most of my patients have success with triamcinolone cream for significant rashes if needed. -She knows to call us if she develops any new or worsening symptoms in the interval   #Hypotension/BP/Atrial Fibrillation  --Follows with cardiology -Recently hospitalized for new onset atrial fibrillation.  --Cardiology made several adjustments in her medications -Currently on Toprol-XL and eliquis  -Recently had BP medications adjusted due to hypotension --BP is normal at 122/66 today.  Heart rate normal at 77.  On exam, it sounds like she is in normal sinus rhythm. -Scheduled to see cardiology next week -The patient had a lot of questions about her low sodium.  We will check this weekly.  She will also talk to her cardiologist next week if she needs to be on a fluid restriction. -Reach out to cardiology to see if she needed to be on the 81 mg aspirin in addition to her Eliquis or not from a cardiac standpoint    #Bilateral lower extremity edema:  --Continue to wear compression stockings.  -She is scheduled to follow-up with Dr. Izell [place] next week to see if she needs to resume  her 6 and potassium   # Normocytic Anemia #Vitamin B12 Deficiency --Anemia likely driven by multiple myeloma, but may be component of vitamin B12 deficiency as well. --initial labs showed elevated MMA with B12 180 -- Continue vitamin B12 1000 mcg p.o. daily -- Continue to monitor   #Left leg/thigh neuropathy: --Currently on gabapentin 900 mg nightly and 600 mg in the morning --Encouraged to try water aerobics and stretches.  --Following with Dr. Debbe Bales (ortho) to see if she is a candidate for steroid injections.  -- Patient evaluated by Dr.  Barbaraann Cao thinks that this may be neuropathy worsened by her Velcade --Velcade discontinued due to rash    #Supportive Care -- chemotherapy education complete. Given handout of daratumumab today  -- port placement not required.  --Awaiting to start Zometa/Xgeva  -- zofran 8mg  q8H PRN and compazine 10mg  PO q6H for nausea -- acyclovir 400mg  PO BID for VCZ prophylaxis -- tylenol 1000 mg q8H PRN for back pain.     No orders of the defined types were placed in this encounter.    The total time spent in the appointment was 30-39 minutes  Ladashia Demarinis L Rosamund Nyland, PA-C 02/23/23

## 2023-02-23 ENCOUNTER — Inpatient Hospital Stay: Payer: Medicare Other

## 2023-02-23 ENCOUNTER — Other Ambulatory Visit: Payer: Self-pay

## 2023-02-23 ENCOUNTER — Inpatient Hospital Stay: Payer: Medicare Other | Admitting: Physician Assistant

## 2023-02-23 ENCOUNTER — Telehealth: Payer: Self-pay | Admitting: Physician Assistant

## 2023-02-23 ENCOUNTER — Encounter: Payer: Self-pay | Admitting: Physician Assistant

## 2023-02-23 ENCOUNTER — Other Ambulatory Visit: Payer: Self-pay | Admitting: Physician Assistant

## 2023-02-23 ENCOUNTER — Inpatient Hospital Stay: Payer: Medicare Other | Attending: Physician Assistant

## 2023-02-23 VITALS — BP 135/71 | HR 73 | Temp 97.8°F | Resp 18

## 2023-02-23 DIAGNOSIS — D649 Anemia, unspecified: Secondary | ICD-10-CM | POA: Diagnosis not present

## 2023-02-23 DIAGNOSIS — I5030 Unspecified diastolic (congestive) heart failure: Secondary | ICD-10-CM | POA: Insufficient documentation

## 2023-02-23 DIAGNOSIS — Z5112 Encounter for antineoplastic immunotherapy: Secondary | ICD-10-CM | POA: Diagnosis not present

## 2023-02-23 DIAGNOSIS — C9000 Multiple myeloma not having achieved remission: Secondary | ICD-10-CM | POA: Diagnosis not present

## 2023-02-23 DIAGNOSIS — I959 Hypotension, unspecified: Secondary | ICD-10-CM | POA: Diagnosis not present

## 2023-02-23 DIAGNOSIS — R251 Tremor, unspecified: Secondary | ICD-10-CM | POA: Insufficient documentation

## 2023-02-23 DIAGNOSIS — R21 Rash and other nonspecific skin eruption: Secondary | ICD-10-CM | POA: Diagnosis not present

## 2023-02-23 DIAGNOSIS — E538 Deficiency of other specified B group vitamins: Secondary | ICD-10-CM | POA: Insufficient documentation

## 2023-02-23 DIAGNOSIS — R059 Cough, unspecified: Secondary | ICD-10-CM | POA: Diagnosis not present

## 2023-02-23 DIAGNOSIS — I4891 Unspecified atrial fibrillation: Secondary | ICD-10-CM | POA: Diagnosis not present

## 2023-02-23 DIAGNOSIS — Z7901 Long term (current) use of anticoagulants: Secondary | ICD-10-CM | POA: Insufficient documentation

## 2023-02-23 DIAGNOSIS — G620 Drug-induced polyneuropathy: Secondary | ICD-10-CM | POA: Diagnosis not present

## 2023-02-23 LAB — CMP (CANCER CENTER ONLY)
ALT: 9 U/L (ref 0–44)
AST: 12 U/L — ABNORMAL LOW (ref 15–41)
Albumin: 3.4 g/dL — ABNORMAL LOW (ref 3.5–5.0)
Alkaline Phosphatase: 66 U/L (ref 38–126)
Anion gap: 7 (ref 5–15)
BUN: 15 mg/dL (ref 8–23)
CO2: 27 mmol/L (ref 22–32)
Calcium: 8.6 mg/dL — ABNORMAL LOW (ref 8.9–10.3)
Chloride: 97 mmol/L — ABNORMAL LOW (ref 98–111)
Creatinine: 0.63 mg/dL (ref 0.44–1.00)
GFR, Estimated: >60 mL/min (ref >60)
Glucose, Bld: 122 mg/dL — ABNORMAL HIGH (ref 70–99)
Potassium: 4.2 mmol/L (ref 3.5–5.1)
Sodium: 131 mmol/L — ABNORMAL LOW (ref 135–145)
Total Bilirubin: 0.7 mg/dL (ref 0.3–1.2)
Total Protein: 6.2 g/dL — ABNORMAL LOW (ref 6.5–8.1)

## 2023-02-23 LAB — CBC WITH DIFFERENTIAL (CANCER CENTER ONLY)
Abs Immature Granulocytes: 0.04 10*3/uL (ref 0.00–0.07)
Basophils Absolute: 0.1 10*3/uL (ref 0.0–0.1)
Basophils Relative: 1 %
Eosinophils Absolute: 0.6 10*3/uL — ABNORMAL HIGH (ref 0.0–0.5)
Eosinophils Relative: 8 %
HCT: 36.5 % (ref 36.0–46.0)
Hemoglobin: 12.5 g/dL (ref 12.0–15.0)
Immature Granulocytes: 1 %
Lymphocytes Relative: 9 %
Lymphs Abs: 0.6 10*3/uL — ABNORMAL LOW (ref 0.7–4.0)
MCH: 30.3 pg (ref 26.0–34.0)
MCHC: 34.2 g/dL (ref 30.0–36.0)
MCV: 88.4 fL (ref 80.0–100.0)
Monocytes Absolute: 1.4 10*3/uL — ABNORMAL HIGH (ref 0.1–1.0)
Monocytes Relative: 22 %
Neutro Abs: 4 10*3/uL (ref 1.7–7.7)
Neutrophils Relative %: 59 %
Platelet Count: 257 10*3/uL (ref 150–400)
RBC: 4.13 MIL/uL (ref 3.87–5.11)
RDW: 17.6 % — ABNORMAL HIGH (ref 11.5–15.5)
WBC Count: 6.7 10*3/uL (ref 4.0–10.5)
nRBC: 0 % (ref 0.0–0.2)

## 2023-02-23 LAB — TYPE AND SCREEN
ABO/RH(D): O POS
Antibody Screen: NEGATIVE

## 2023-02-23 MED ORDER — ACYCLOVIR 400 MG PO TABS
400.0000 mg | ORAL_TABLET | Freq: Two times a day (BID) | ORAL | 2 refills | Status: DC
Start: 2023-02-23 — End: 2023-03-01

## 2023-02-23 MED ORDER — ACETAMINOPHEN 325 MG PO TABS
650.0000 mg | ORAL_TABLET | Freq: Once | ORAL | Status: AC
  Administered 2023-02-23: 650 mg via ORAL
  Filled 2023-02-23: qty 2

## 2023-02-23 MED ORDER — MONTELUKAST SODIUM 10 MG PO TABS
10.0000 mg | ORAL_TABLET | Freq: Once | ORAL | Status: AC
  Administered 2023-02-23: 10 mg via ORAL
  Filled 2023-02-23: qty 1

## 2023-02-23 MED ORDER — DEXAMETHASONE 4 MG PO TABS
20.0000 mg | ORAL_TABLET | Freq: Once | ORAL | Status: AC
  Administered 2023-02-23: 20 mg via ORAL
  Filled 2023-02-23: qty 5

## 2023-02-23 MED ORDER — DARATUMUMAB-HYALURONIDASE-FIHJ 1800-30000 MG-UT/15ML ~~LOC~~ SOLN
1800.0000 mg | Freq: Once | SUBCUTANEOUS | Status: AC
  Administered 2023-02-23: 1800 mg via SUBCUTANEOUS
  Filled 2023-02-23: qty 15

## 2023-02-23 MED ORDER — DIPHENHYDRAMINE HCL 25 MG PO CAPS
50.0000 mg | ORAL_CAPSULE | Freq: Once | ORAL | Status: AC
  Administered 2023-02-23: 50 mg via ORAL
  Filled 2023-02-23: qty 2

## 2023-02-23 NOTE — Patient Instructions (Signed)
[facility]  Discharge Instructions: Thank you for choosing [facility] to provide your oncology and hematology care.   If you have a lab appointment with the Cancer Center, please go directly to the Cancer Center and check in at the registration area.   Wear comfortable clothing and clothing appropriate for easy access to any Portacath or PICC line.   We strive to give you quality time with your provider. You may need to reschedule your appointment if you arrive late (15 or more minutes).  Arriving late affects you and other patients whose appointments are after yours.  Also, if you miss three or more appointments without notifying the office, you may be dismissed from the clinic at the provider's discretion.      For prescription refill requests, have your pharmacy contact our office and allow 72 hours for refills to be completed.    Today you received the following chemotherapy and/or immunotherapy agents Darzalex Faspro      To help prevent nausea and vomiting after your treatment, we encourage you to take your nausea medication as directed.  BELOW ARE SYMPTOMS THAT SHOULD BE REPORTED IMMEDIATELY: *FEVER GREATER THAN 100.4 F (38 C) OR HIGHER *CHILLS OR SWEATING *NAUSEA AND VOMITING THAT IS NOT CONTROLLED WITH YOUR NAUSEA MEDICATION *UNUSUAL SHORTNESS OF BREATH *UNUSUAL BRUISING OR BLEEDING *URINARY PROBLEMS (pain or burning when urinating, or frequent urination) *BOWEL PROBLEMS (unusual diarrhea, constipation, pain near the anus) TENDERNESS IN MOUTH AND THROAT WITH OR WITHOUT PRESENCE OF ULCERS (sore throat, sores in mouth, or a toothache) UNUSUAL RASH, SWELLING OR PAIN  UNUSUAL VAGINAL DISCHARGE OR ITCHING   Items with * indicate a potential emergency and should be followed up as soon as possible or go to the Emergency Department if any problems should occur.  Please show the CHEMOTHERAPY ALERT CARD or IMMUNOTHERAPY ALERT CARD at  check-in to the Emergency Department and triage nurse.  Should you have questions after your visit or need to cancel or reschedule your appointment, please contact [facility]  Dept: [phone]  and follow the prompts.  Office hours are 8:00 a.m. to 4:30 p.m. Monday - Friday. Please note that voicemails left after 4:00 p.m. may not be returned until the following business day.  We are closed weekends and major holidays. You have access to a nurse at all times for urgent questions. Please call the main number to the clinic Dept: [phone] and follow the prompts.   For any non-urgent questions, you may also contact your provider using MyChart. We now offer e-Visits for anyone 18 and older to request care online for non-urgent symptoms. For details visit [email or web address].   Also download the MyChart app! Go to the app store, search "MyChart", open the app, select [facility], and log in with your MyChart username and password.  Daratumumab; Hyaluronidase Injection What is this medication? DARATUMUMAB; HYALURONIDASE (dar a toom ue mab; hye al ur ON i dase) treats multiple myeloma, a type of bone marrow cancer. Daratumumab works by blocking a protein that causes cancer cells to grow and multiply. This helps to slow or stop the spread of cancer cells. Hyaluronidase works by increasing the absorption of other medications in the body to help them work better. This medication may also be used treat amyloidosis, a condition that causes the buildup of a protein (amyloid) in your body. It works by reducing the buildup of this protein, which decreases symptoms. It   is a combination medication that contains a monoclonal antibody. This medicine may be used for other purposes; ask your health care provider or pharmacist if you have questions. COMMON BRAND NAME(S): DARZALEX FASPRO What should I tell my care team before I take this medication? They need to know if you  have any of these conditions: Heart disease Infection, such as chickenpox, cold sores, herpes, hepatitis B Lung or breathing disease An unusual or allergic reaction to daratumumab, hyaluronidase, other medications, foods, dyes, or preservatives Pregnant or trying to get pregnant Breast-feeding How should I use this medication? This medication is injected under the skin. It is given by your care team in a hospital or clinic setting. Talk to your care team about the use of this medication in children. Special care may be needed. Overdosage: If you think you have taken too much of this medicine contact a poison control center or emergency room at once. NOTE: This medicine is only for you. Do not share this medicine with others. What if I miss a dose? Keep appointments for follow-up doses. It is important not to miss your dose. Call your care team if you are unable to keep an appointment. What may interact with this medication? Interactions have not been studied. This list may not describe all possible interactions. Give your health care provider a list of all the medicines, herbs, non-prescription drugs, or dietary supplements you use. Also tell them if you smoke, drink alcohol, or use illegal drugs. Some items may interact with your medicine. What should I watch for while using this medication? Your condition will be monitored carefully while you are receiving this medication. This medication can cause serious allergic reactions. To reduce your risk, your care team may give you other medication to take before receiving this one. Be sure to follow the directions from your care team. This medication can affect the results of blood tests to match your blood type. These changes can last for up to 6 months after the final dose. Your care team will do blood tests to match your blood type before you start treatment. Tell all of your care team that you are being treated with this medication before  receiving a blood transfusion. This medication can affect the results of some tests used to determine treatment response; extra tests may be needed to evaluate response. Talk to your care team if you wish to become pregnant or think you are pregnant. This medication can cause serious birth defects if taken during pregnancy and for 3 months after the last dose. A reliable form of contraception is recommended while taking this medication and for 3 months after the last dose. Talk to your care team about effective forms of contraception. Do not breast-feed while taking this medication. What side effects may I notice from receiving this medication? Side effects that you should report to your care team as soon as possible: Allergic reactions--skin rash, itching, hives, swelling of the face, lips, tongue, or throat Heart rhythm changes--fast or irregular heartbeat, dizziness, feeling faint or lightheaded, chest pain, trouble breathing Infection--fever, chills, cough, sore throat, wounds that don't heal, pain or trouble when passing urine, general feeling of discomfort or being unwell Infusion reactions--chest pain, shortness of breath or trouble breathing, feeling faint or lightheaded Sudden eye pain or change in vision such as blurry vision, seeing halos around lights, vision loss Unusual bruising or bleeding Side effects that usually do not require medical attention (report to your care team if they continue or are   bothersome): Constipation Diarrhea Fatigue Nausea Pain, tingling, or numbness in the hands or feet Swelling of the ankles, hands, or feet This list may not describe all possible side effects. Call your doctor for medical advice about side effects. You may report side effects to FDA at 1-800-FDA-1088. Where should I keep my medication? This medication is given in a hospital or clinic. It will not be stored at home. NOTE: This sheet is a summary. It may not cover all possible information.  If you have questions about this medicine, talk to your doctor, pharmacist, or health care provider.  2023 Elsevier/Gold Standard (2022-02-01 00:00:00)  

## 2023-02-23 NOTE — Progress Notes (Signed)
Patient tolerated her dazalex well- no issues except a little sleeepiness from the benadryl. Observed for two hours post infusion. VSS-  BP 135/71 (BP Location: Right Arm, Patient Position: Sitting)   Pulse 73   Temp 97.8 F (36.6 C) (Oral)   Resp 18   SpO2 97%   WC to the lobby with her daughter-in-law.

## 2023-02-24 LAB — PRETREATMENT RBC PHENOTYPE

## 2023-02-26 ENCOUNTER — Other Ambulatory Visit: Payer: Medicare Other

## 2023-02-26 ENCOUNTER — Telehealth: Payer: Self-pay | Admitting: *Deleted

## 2023-02-26 ENCOUNTER — Ambulatory Visit: Payer: Medicare Other

## 2023-02-26 ENCOUNTER — Encounter: Payer: Self-pay | Admitting: Hematology and Oncology

## 2023-02-26 NOTE — Telephone Encounter (Signed)
Called pt to see how she did with recent treatment.  She reports doing well & denies any problems other than slight h/a.  She doesn't want to take anything for h/a but informed that she could take tylenol if needed.  She knows her appts & how to reach [place] if needed.

## 2023-02-26 NOTE — Telephone Encounter (Signed)
Received call from pt . She is asking if she needs to start a drug called Allopurinol. With her new Myeloma treatment.  Spoke with [name], PA and she states Zoe [name] does not need this.  Advised patient that she will not need this particular medication. She voiced understanding.

## 2023-02-26 NOTE — Telephone Encounter (Signed)
-----   Message from Mickeal Needy, RN sent at 02/23/2023  3:38 PM EDT ----- Regarding: First time Darzalex faspro 02/23/23- patient of Dr. Leonides Schanz. Tolerated her darzalex well.

## 2023-02-27 ENCOUNTER — Telehealth: Payer: Self-pay | Admitting: Podiatry

## 2023-02-27 ENCOUNTER — Ambulatory Visit: Payer: Medicare Other

## 2023-02-27 ENCOUNTER — Other Ambulatory Visit: Payer: Medicare Other

## 2023-02-27 LAB — PRETREATMENT RBC PHENOTYPE: DAT, IgG: NEGATIVE

## 2023-02-27 NOTE — Telephone Encounter (Signed)
Pt called and cxled her upcoming appt she was not happy with the way Dr Zoe [name] cut her nails, He cut them straight across.She did not want to reschedule at this time. I did apologize and told her we could get her in with another provider if she decides she would like to come back in.

## 2023-02-28 ENCOUNTER — Other Ambulatory Visit: Payer: Self-pay | Admitting: *Deleted

## 2023-02-28 DIAGNOSIS — C9000 Multiple myeloma not having achieved remission: Secondary | ICD-10-CM

## 2023-02-28 MED ORDER — LENALIDOMIDE 25 MG PO CAPS
25.0000 mg | ORAL_CAPSULE | Freq: Every day | ORAL | 0 refills | Status: DC
Start: 2023-02-28 — End: 2023-03-21

## 2023-03-01 ENCOUNTER — Encounter: Payer: Self-pay | Admitting: Cardiology

## 2023-03-01 ENCOUNTER — Ambulatory Visit: Payer: Medicare Other | Admitting: Cardiology

## 2023-03-01 VITALS — BP 129/68 | HR 61 | Resp 16 | Ht 63.0 in | Wt 152.2 lb

## 2023-03-01 DIAGNOSIS — C9000 Multiple myeloma not having achieved remission: Secondary | ICD-10-CM | POA: Diagnosis not present

## 2023-03-01 DIAGNOSIS — E871 Hypo-osmolality and hyponatremia: Secondary | ICD-10-CM

## 2023-03-01 DIAGNOSIS — I480 Paroxysmal atrial fibrillation: Secondary | ICD-10-CM

## 2023-03-01 DIAGNOSIS — I5033 Acute on chronic diastolic (congestive) heart failure: Secondary | ICD-10-CM | POA: Diagnosis not present

## 2023-03-01 MED ORDER — APIXABAN 5 MG PO TABS
5.0000 mg | ORAL_TABLET | Freq: Two times a day (BID) | ORAL | 1 refills | Status: DC
Start: 2023-03-01 — End: 2023-11-15

## 2023-03-01 MED ORDER — METOPROLOL SUCCINATE ER 25 MG PO TB24
25.0000 mg | ORAL_TABLET | Freq: Every day | ORAL | 2 refills | Status: DC
Start: 2023-03-01 — End: 2023-04-24

## 2023-03-01 MED ORDER — AMIODARONE HCL 200 MG PO TABS
200.0000 mg | ORAL_TABLET | Freq: Every day | ORAL | 2 refills | Status: DC
Start: 2023-03-01 — End: 2023-03-29

## 2023-03-01 NOTE — Progress Notes (Signed)
Primary Physician:  [facility]   Patient ID: Zoe [name], female    DOB: 04/30/1934, 89 y.o.   MRN: [id number]  Subjective:    No chief complaint on file.   HPI: Zoe [name]  is a 89 y.o. Caucasian female with history of possibly mild coronary spasm by coronary angiogram in 2014,  severe AS s/p TAVR by Dr. Cornelius Moras on 09/03/2018, asymptomatic bilateral carotid artery stenosis,  essential hypertension, hyperlipidemia, hyperglycemia and GERD. She underwent T8 kyphoplasty on 09/07/2022 without any postprocedural cardiac complications.  Patient also going through chemotherapy, presently diagnosed with multiple myeloma.  Patient was admitted 04/03/2022 - 04/05/2022 with acute on chronic diastolic heart failure and hyponatremia (likely dilutional), as well as AKI.  She is accompanied by daughter-in-law.  States that since hospital discharge she had initially felt well but again has noticed shortness of breath.  She is also noticing elevated heart rate and palpitations.  No leg edema.  Past Medical History:  Diagnosis Date  . Arthritis    some - per patient  . Breast cancer (HCC)    breast cancer / left   . Cataract    bilat   . GERD (gastroesophageal reflux disease)   . History of kidney stones   . Hyperlipidemia   . Hypertension   . Hypothyroidism   . Macular degeneration    Left  . S/P TAVR (transcatheter aortic valve replacement) 09/03/2018   23 mm Edwards Sapien 3 transcatheter heart valve placed via percutaneous right transfemoral approach   . Severe aortic stenosis   . Stress incontinence   . Thyroid disease   . Tinnitus    Social History   Tobacco Use  . Smoking status: Never  . Smokeless tobacco: Never  Substance Use Topics  . Alcohol use: No     Review of Systems  Cardiovascular:  Positive for dyspnea on exertion and palpitations. Negative for chest pain and leg swelling.   Objective:  Blood pressure 129/68, pulse 61, resp. rate  16, height 5\' 3"  (1.6 m), weight 152 lb 3.2 oz (69 kg), SpO2 97 %. Body mass index is 26.96 kg/m.     03/01/2023   10:53 AM 02/23/2023    3:34 PM 02/23/2023   10:47 AM  Vitals with BMI  Height 5\' 3"   5\' 3"   Weight 152 lbs 3 oz  157 lbs  BMI 26.97  27.82  Systolic 129 135 409  Diastolic 68 71 66  Pulse 61 73 77      Physical Exam Neck:     Vascular: No carotid bruit or JVD.  Cardiovascular:     Rate and Rhythm: Tachycardia present. Rhythm irregular.     Pulses: Intact distal pulses.     Heart sounds: Normal heart sounds. No murmur heard.    No gallop.  Pulmonary:     Effort: Pulmonary effort is normal.     Breath sounds: Normal breath sounds.  Abdominal:     General: Bowel sounds are normal.     Palpations: Abdomen is soft.  Musculoskeletal:     Right lower leg: No edema.     Left lower leg: No edema.   Radiology: No results found.  Laboratory examination:   Lab Results  Component Value Date   NA 131 (L) 02/23/2023   K 4.2 02/23/2023   CO2 27 02/23/2023   GLUCOSE 122 (H) 02/23/2023   BUN 15 02/23/2023   CREATININE 0.63 02/23/2023   CALCIUM 8.6 (L) 02/23/2023  EGFR 59 (L) 06/19/2022   GFRNONAA >60 02/23/2023       Latest Ref Rng & Units 02/23/2023   10:32 AM 02/19/2023    1:21 AM 02/18/2023   10:18 AM  CMP  Glucose 70 - 99 mg/dL 161  096  045   BUN 8 - 23 mg/dL 15  27  16    Creatinine 0.44 - 1.00 mg/dL 4.09  8.11  9.14   Sodium 135 - 145 mmol/L 131  126  126   Potassium 3.5 - 5.1 mmol/L 4.2  4.5  3.8   Chloride 98 - 111 mmol/L 97  95  92   CO2 22 - 32 mmol/L 27  25  24    Calcium 8.9 - 10.3 mg/dL 8.6  8.1  8.1   Total Protein 6.5 - 8.1 g/dL 6.2  4.7    Total Bilirubin 0.3 - 1.2 mg/dL 0.7  0.4    Alkaline Phos 38 - 126 U/L 66  62    AST 15 - 41 U/L 12  12    ALT 0 - 44 U/L 9  13        Latest Ref Rng & Units 02/23/2023   10:32 AM 02/19/2023    1:21 AM 02/18/2023    1:00 AM  CBC  WBC 4.0 - 10.5 K/uL 6.7  9.9  8.0   Hemoglobin 12.0 - 15.0 g/dL 78.2  95.6   21.3   Hematocrit 36.0 - 46.0 % 36.5  32.7  32.3   Platelets 150 - 400 K/uL 257  320  276    Lipid Panel     Component Value Date/Time   CHOL 103 07/16/2019 0941   TRIG 75 07/16/2019 0941   HDL 48 07/16/2019 0941   LDLCALC 39 07/16/2019 0941   LDLDIRECT 48 07/16/2019 0941   HEMOGLOBIN A1C Lab Results  Component Value Date   HGBA1C 5.8 (H) 07/16/2019   MPG 111.15 08/30/2018   TSH Recent Labs    04/02/22 1830 02/17/23 0922  TSH 0.891 1.499   External labs  Labs 07/10/2022:  TSH 0.503, normal.  Serum osmolality 287, urinary osmolality 90  Total cholesterol 113, triglycerides 100, HDL 43, LDL 50.   Current Outpatient Medications:  .  amiodarone (PACERONE) 200 MG tablet, Take 1 tablet (200 mg total) by mouth daily. One tab twice daily for 10 days then one tab daily, Disp: 40 tablet, Rfl: 2 .  Artificial Tear Solution (SOOTHE XP) SOLN, Place 1 drop into both eyes every evening., Disp: , Rfl:  .  Cholecalciferol (VITAMIN D3) 50 MCG (2000 UT) capsule, Take 1 capsule (2,000 Units total) by mouth daily., Disp: , Rfl:  .  [employer] VITAMIN B12 1000 MCG tablet, TAKE 1 TABLET BY MOUTH EVERY DAY, Disp: 90 tablet, Rfl: 1 .  dapagliflozin propanediol (FARXIGA) 10 MG TABS tablet, TAKE 1 TABLET BY MOUTH EVERY DAY, Disp: 90 tablet, Rfl: 2 .  dexamethasone (DECADRON) 4 MG tablet, Take 5 tablets (20 mg total) by mouth once a week. Take 20 mg (5 tablets) on day of multiple myeloma treatment, Disp: 20 tablet, Rfl: 5 .  esomeprazole (NEXIUM) 20 MG capsule, Take 20 mg by mouth daily as needed (Heartburn)., Disp: , Rfl:  .  gabapentin (NEURONTIN) 300 MG capsule, Take 3 capsules (900 mg total) by mouth 2 (two) times daily. (Patient taking differently: Take 1-2 capsules by mouth 2 (two) times daily. 200mg  in the morning and 300mg  in the evening), Disp: 180 capsule, Rfl: 1 .  hydrocortisone  cream 1 %, Apply 1 Application topically 2 (two) times daily., Disp: 120 g, Rfl: 0 .  lenalidomide (REVLIMID) 25 MG  capsule, Take 1 capsule (25 mg total) by mouth daily. Take for 14 days,then none for 7 days.Repeat every 21 days. Auth #  [id number] Date obtained 02/28/23, Disp: 14 capsule, Rfl: 0 .  levothyroxine (SYNTHROID, LEVOTHROID) 100 MCG tablet, Take 100 mcg by mouth daily before breakfast., Disp: , Rfl: 2 .  OVER THE COUNTER MEDICATION, Take 1 tablet by mouth daily. AREDS, Disp: , Rfl:  .  potassium chloride (KLOR-CON M10) 10 MEQ tablet, Take 2 tablets (20 mEq total) by mouth 2 (two) times daily., Disp: 180 tablet, Rfl: 1 .  pravastatin (PRAVACHOL) 40 MG tablet, Take 40 mg by mouth every evening., Disp: , Rfl:  .  sennosides-docusate sodium (SENOKOT-S) 8.6-50 MG tablet, Take 1 tablet by mouth daily., Disp: , Rfl:  .  triamcinolone ointment (KENALOG) 0.5 %, Apply 1 Application topically 2 (two) times daily., Disp: 30 g, Rfl: 0 .  apixaban (ELIQUIS) 5 MG TABS tablet, Take 1 tablet (5 mg total) by mouth 2 (two) times daily., Disp: 180 tablet, Rfl: 1 .  metoprolol succinate (TOPROL-XL) 25 MG 24 hr tablet, Take 1 tablet (25 mg total) by mouth daily., Disp: 30 tablet, Rfl: 2 .  polyethylene glycol (MIRALAX / GLYCOLAX) 17 g packet, Take 17 g by mouth daily as needed for mild constipation. (Patient not taking: Reported on 03/01/2023), Disp: , Rfl:  .  prochlorperazine (COMPAZINE) 10 MG tablet, Take 1 tablet (10 mg total) by mouth every 6 (six) hours as needed for nausea or vomiting. (Patient not taking: Reported on 03/01/2023), Disp: 30 tablet, Rfl: 0    Chest x-ray 02/17/2023: 1. Possible tiny pleural effusions. No other acute abnormalities. 2. Stable cardiomegaly. Cardiac Studies:    Coronary angiogram 08/06/2018: normal coronary arteries. Moderate pulmonary hypertension due to AS with elevated PW. Preserved CO and CI.  TAVR 09/03/2018: Edwards Sapien 3 THV (size 23 mm, model # 9600TFX, serial # [id number]) by Ashley Mariner and Earney Hamburg.  CXR PA/LAT 11/07/2018:  Cardiac shadow is enlarged but stable. Changes of prior  TAVR ar again noted. Aortic calcifications are seen. Previously noted left upper lobe pneumonia has resolved in the interval. No focal infiltrate or sizable effusion is seen. Changes of prior vertebral augmentation are noted.  Carotid artery duplex 01/05/2021: Minimal stenosis in the right internal carotid artery (1-15%). Stenosis in the right external carotid artery (<50%). Duplex suggests stenosis in the left internal carotid artery (50-69%). Stenosis in the left external carotid artery (<50%). Antegrade right vertebral artery flow. Antegrade left vertebral artery flow. Follow up in six months is appropriate if clinically indicated.  No significant change since 06/23/2020.  ECHOCARDIOGRAM COMPLETE 02/17/2023:  1. Left ventricular ejection fraction, by estimation, is 55 to 60%. The left ventricle has normal function. The left ventricle has no regional wall motion abnormalities. There is mild left ventricular hypertrophy. Left ventricular diastolic parameters  are consistent with Grade II diastolic dysfunction (pseudonormalization). Elevated left atrial pressure.  2. Right ventricular systolic function is normal. The right ventricular size is normal. There is mildly elevated pulmonary artery systolic pressure.  3. Left atrial size was severely dilated.  4. The mitral valve is abnormal. Mild mitral valve regurgitation. Moderate mitral stenosis.  5. The tricuspid valve is abnormal.  6. Edwards Sapien 3 THV size 23 mm is in the AV position. . The aortic valve has been repaired/replaced. Aortic valve regurgitation is  not visualized. No aortic stenosis is present.  7. The inferior vena cava is dilated in size with >50% respiratory variability, suggesting right atrial pressure of 8 mmHg.  Carotid artery duplex 09/21/2022:  Duplex suggests stenosis in the right internal carotid artery (1-15%).  Duplex suggests stenosis in the left internal carotid artery (16-49%).  Antegrade right vertebral artery  flow. Antegrade left vertebral artery flow.  Compared to the study done on 02/15/2022, left ICA stenosis of >50% has now regressed.  Otherwise no significant change.  There is mild to moderate heterogenous plaque noted in the bilateral carotid arteries. Follow up in one year is appropriate if clinically indicated.   EKG  EKG 06/22/2022: Normal sinus rhythm at a rate of 72 bpm, normal axis, no evidence of ischemia, normal EKG.  No change from 01/24/2022.     Assessment:     ICD-10-CM   1. Acute on chronic diastolic (congestive) heart failure (HCC)  I50.33 EKG 12-Lead    2. Paroxysmal atrial fibrillation (HCC)  I48.0 amiodarone (PACERONE) 200 MG tablet    metoprolol succinate (TOPROL-XL) 25 MG 24 hr tablet    apixaban (ELIQUIS) 5 MG TABS tablet    3. Hyponatremia with excess extracellular fluid volume  E87.1     4. Multiple myeloma not having achieved remission (HCC)  C90.00      Meds ordered this encounter  Medications  . amiodarone (PACERONE) 200 MG tablet    Sig: Take 1 tablet (200 mg total) by mouth daily. One tab twice daily for 10 days then one tab daily    Dispense:  40 tablet    Refill:  2  . metoprolol succinate (TOPROL-XL) 25 MG 24 hr tablet    Sig: Take 1 tablet (25 mg total) by mouth daily.    Dispense:  30 tablet    Refill:  2  . apixaban (ELIQUIS) 5 MG TABS tablet    Sig: Take 1 tablet (5 mg total) by mouth 2 (two) times daily.    Dispense:  180 tablet    Refill:  1    Medications Discontinued During This Encounter  Medication Reason  . acyclovir (ZOVIRAX) 400 MG tablet   . aspirin EC 81 MG tablet Change in therapy  . metoprolol succinate (TOPROL-XL) 25 MG 24 hr tablet Reorder  . apixaban (ELIQUIS) 5 MG TABS tablet Reorder    Recommendations:   Zoe [name] is a fairly active 89 y.o. Caucasian female with history of possibly mild coronary spasm by coronary angiogram in 2014,  severe AS s/p TAVR by Dr. Cornelius Moras on 09/03/2018, asymptomatic bilateral carotid  artery stenosis,  essential hypertension, hyperlipidemia, hyperglycemia and GERD. She underwent T8 kyphoplasty on 09/07/2022 without any postprocedural cardiac complications.  Patient also going through chemotherapy, presently diagnosed with multiple myeloma.  Patient was admitted 04/03/2022 - 04/05/2022 with acute on chronic diastolic heart failure and hyponatremia (likely dilutional), as well as AKI.    1. Acute on chronic diastolic (congestive) heart failure (HCC) Suspected acute decompensated heart failure is a combination of anemia, A-fib with RVR, acute on chronic diastolic heart failure from age-related and hypertension related cardiac abnormality. She is presently doing well, advised her to continue present management including Farxiga, furosemide and metoprolol succinate.  Losartan was discontinued due to acute renal failure while she was in the hospital.  Labetalol was switched over to metoprolol.  I will reinitiate losartan when she is more stable, she has blood work pending sometime in the next 1 week to 10 days,  I will see her back in 2 weeks.  - EKG 12-Lead  2. Paroxysmal atrial fibrillation (HCC) Patient was transiently on IV amiodarone, she converted to sinus rhythm, carvedilol switched over to metoprolol and discharged home without amiodarone.  I will start her back on amiodarone 200 mg p.o. twice daily for 10 days followed by 12 mg daily.  Will increase metoprolol succinate from 12.5 mg to 25 mg daily.  - amiodarone (PACERONE) 200 MG tablet; Take 1 tablet (200 mg total) by mouth daily. One tab twice daily for 10 days then one tab daily  Dispense: 40 tablet; Refill: 2 - metoprolol succinate (TOPROL-XL) 25 MG 24 hr tablet; Take 1 tablet (25 mg total) by mouth daily.  Dispense: 30 tablet; Refill: 2  3. Hyponatremia with excess extracellular fluid volume Hyponatremia is dilutional.  Advised her not to increase her salt intake.  Advised her to continue to be careful with her diet.  4.  Multiple myeloma not having achieved remission (HCC) Her overall anxiety, fatigue, presentation is also contributed by chemotherapeutic agents and diagnosis of multiple myeloma.  We discussed regarding end-of-life issues, advised her not to be fearful of death but to enjoy her life as it presents.  Fortunately she is doing well and positive reinforcement given.  Patient's daughter present at the bedside.  This was a 40-minute office visit encounter.    Yates Decamp, MD, [facility] 03/01/2023, 12:02 PM Office: [phone] Fax: [phone] Pager: [phone]

## 2023-03-02 ENCOUNTER — Inpatient Hospital Stay: Payer: Medicare Other

## 2023-03-02 ENCOUNTER — Other Ambulatory Visit: Payer: Medicare Other

## 2023-03-02 ENCOUNTER — Other Ambulatory Visit: Payer: Self-pay

## 2023-03-02 ENCOUNTER — Ambulatory Visit: Payer: Medicare Other

## 2023-03-02 ENCOUNTER — Inpatient Hospital Stay (HOSPITAL_BASED_OUTPATIENT_CLINIC_OR_DEPARTMENT_OTHER): Payer: Medicare Other | Admitting: Physician Assistant

## 2023-03-02 VITALS — BP 94/58 | HR 65 | Resp 16

## 2023-03-02 VITALS — BP 123/71 | HR 79 | Temp 98.6°F | Resp 15 | Wt 152.0 lb

## 2023-03-02 DIAGNOSIS — I5030 Unspecified diastolic (congestive) heart failure: Secondary | ICD-10-CM | POA: Diagnosis not present

## 2023-03-02 DIAGNOSIS — G629 Polyneuropathy, unspecified: Secondary | ICD-10-CM

## 2023-03-02 DIAGNOSIS — R059 Cough, unspecified: Secondary | ICD-10-CM | POA: Diagnosis not present

## 2023-03-02 DIAGNOSIS — I959 Hypotension, unspecified: Secondary | ICD-10-CM | POA: Diagnosis not present

## 2023-03-02 DIAGNOSIS — Z7901 Long term (current) use of anticoagulants: Secondary | ICD-10-CM | POA: Diagnosis not present

## 2023-03-02 DIAGNOSIS — I4891 Unspecified atrial fibrillation: Secondary | ICD-10-CM | POA: Diagnosis not present

## 2023-03-02 DIAGNOSIS — C9000 Multiple myeloma not having achieved remission: Secondary | ICD-10-CM

## 2023-03-02 DIAGNOSIS — D649 Anemia, unspecified: Secondary | ICD-10-CM | POA: Diagnosis not present

## 2023-03-02 DIAGNOSIS — R21 Rash and other nonspecific skin eruption: Secondary | ICD-10-CM | POA: Diagnosis not present

## 2023-03-02 DIAGNOSIS — R251 Tremor, unspecified: Secondary | ICD-10-CM | POA: Diagnosis not present

## 2023-03-02 DIAGNOSIS — G620 Drug-induced polyneuropathy: Secondary | ICD-10-CM | POA: Diagnosis not present

## 2023-03-02 DIAGNOSIS — Z5112 Encounter for antineoplastic immunotherapy: Secondary | ICD-10-CM | POA: Diagnosis not present

## 2023-03-02 DIAGNOSIS — E538 Deficiency of other specified B group vitamins: Secondary | ICD-10-CM | POA: Diagnosis not present

## 2023-03-02 LAB — CBC WITH DIFFERENTIAL (CANCER CENTER ONLY)
Abs Immature Granulocytes: 0.02 10*3/uL (ref 0.00–0.07)
Basophils Absolute: 0.1 10*3/uL (ref 0.0–0.1)
Basophils Relative: 1 %
Eosinophils Absolute: 0.4 10*3/uL (ref 0.0–0.5)
Eosinophils Relative: 4 %
HCT: 41.8 % (ref 36.0–46.0)
Hemoglobin: 14.1 g/dL (ref 12.0–15.0)
Immature Granulocytes: 0 %
Lymphocytes Relative: 3 %
Lymphs Abs: 0.2 10*3/uL — ABNORMAL LOW (ref 0.7–4.0)
MCH: 29.7 pg (ref 26.0–34.0)
MCHC: 33.7 g/dL (ref 30.0–36.0)
MCV: 88 fL (ref 80.0–100.0)
Monocytes Absolute: 0.7 10*3/uL (ref 0.1–1.0)
Monocytes Relative: 7 %
Neutro Abs: 7.6 10*3/uL (ref 1.7–7.7)
Neutrophils Relative %: 85 %
Platelet Count: 151 10*3/uL (ref 150–400)
RBC: 4.75 MIL/uL (ref 3.87–5.11)
RDW: 17.2 % — ABNORMAL HIGH (ref 11.5–15.5)
WBC Count: 8.9 10*3/uL (ref 4.0–10.5)
nRBC: 0 % (ref 0.0–0.2)

## 2023-03-02 LAB — CMP (CANCER CENTER ONLY)
ALT: 12 U/L (ref 0–44)
AST: 12 U/L — ABNORMAL LOW (ref 15–41)
Albumin: 3.5 g/dL (ref 3.5–5.0)
Alkaline Phosphatase: 70 U/L (ref 38–126)
Anion gap: 9 (ref 5–15)
BUN: 30 mg/dL — ABNORMAL HIGH (ref 8–23)
CO2: 26 mmol/L (ref 22–32)
Calcium: 8.4 mg/dL — ABNORMAL LOW (ref 8.9–10.3)
Chloride: 99 mmol/L (ref 98–111)
Creatinine: 0.77 mg/dL (ref 0.44–1.00)
GFR, Estimated: >60 mL/min (ref >60)
Glucose, Bld: 113 mg/dL — ABNORMAL HIGH (ref 70–99)
Potassium: 4 mmol/L (ref 3.5–5.1)
Sodium: 134 mmol/L — ABNORMAL LOW (ref 135–145)
Total Bilirubin: 0.8 mg/dL (ref 0.3–1.2)
Total Protein: 6 g/dL — ABNORMAL LOW (ref 6.5–8.1)

## 2023-03-02 LAB — LACTATE DEHYDROGENASE: LDH: 190 U/L (ref 98–192)

## 2023-03-02 MED ORDER — ACETAMINOPHEN 325 MG PO TABS
650.0000 mg | ORAL_TABLET | Freq: Once | ORAL | Status: AC
  Administered 2023-03-02: 650 mg via ORAL
  Filled 2023-03-02: qty 2

## 2023-03-02 MED ORDER — DARATUMUMAB-HYALURONIDASE-FIHJ 1800-30000 MG-UT/15ML ~~LOC~~ SOLN
1800.0000 mg | Freq: Once | SUBCUTANEOUS | Status: AC
  Administered 2023-03-02: 1800 mg via SUBCUTANEOUS
  Filled 2023-03-02: qty 15

## 2023-03-02 MED ORDER — DIPHENHYDRAMINE HCL 25 MG PO CAPS
25.0000 mg | ORAL_CAPSULE | Freq: Once | ORAL | Status: AC
  Administered 2023-03-02: 25 mg via ORAL
  Filled 2023-03-02: qty 1

## 2023-03-02 MED ORDER — MONTELUKAST SODIUM 10 MG PO TABS
10.0000 mg | ORAL_TABLET | Freq: Once | ORAL | Status: AC
  Administered 2023-03-02: 10 mg via ORAL
  Filled 2023-03-02: qty 1

## 2023-03-02 MED ORDER — DEXAMETHASONE 4 MG PO TABS
20.0000 mg | ORAL_TABLET | Freq: Once | ORAL | Status: AC
  Administered 2023-03-02: 20 mg via ORAL
  Filled 2023-03-02: qty 5

## 2023-03-02 NOTE — Progress Notes (Signed)
Decrease diphenhydramine dose to 25 mg po x 1 prior to Faspro due to excessive drowsiness with cycle 1 day 1.  T.O. [name], PA-C/Margeart Allender Yetta Barre, PharmD

## 2023-03-02 NOTE — Progress Notes (Signed)
Patient observed for 60-minutes following administration of second-time darzalex faspro injection. Vital signs retaken and remained stable. Patient showed no signs of distress upon discharge.

## 2023-03-02 NOTE — Patient Instructions (Signed)
[facility]   Discharge Instructions: Thank you for choosing [facility] to provide your oncology and hematology care.   If you have a lab appointment with the Cancer Center, please go directly to the Cancer Center and check in at the registration area.   Wear comfortable clothing and clothing appropriate for easy access to any Portacath or PICC line.   We strive to give you quality time with your provider. You may need to reschedule your appointment if you arrive late (15 or more minutes).  Arriving late affects you and other patients whose appointments are after yours.  Also, if you miss three or more appointments without notifying the office, you may be dismissed from the clinic at the provider's discretion.      For prescription refill requests, have your pharmacy contact our office and allow 72 hours for refills to be completed.    Today you received the following chemotherapy and/or immunotherapy agents: Daratumumab hyaluronidase (Darzalex faspro)       To help prevent nausea and vomiting after your treatment, we encourage you to take your nausea medication as directed.  BELOW ARE SYMPTOMS THAT SHOULD BE REPORTED IMMEDIATELY: *FEVER GREATER THAN 100.4 F (38 C) OR HIGHER *CHILLS OR SWEATING *NAUSEA AND VOMITING THAT IS NOT CONTROLLED WITH YOUR NAUSEA MEDICATION *UNUSUAL SHORTNESS OF BREATH *UNUSUAL BRUISING OR BLEEDING *URINARY PROBLEMS (pain or burning when urinating, or frequent urination) *BOWEL PROBLEMS (unusual diarrhea, constipation, pain near the anus) TENDERNESS IN MOUTH AND THROAT WITH OR WITHOUT PRESENCE OF ULCERS (sore throat, sores in mouth, or a toothache) UNUSUAL RASH, SWELLING OR PAIN  UNUSUAL VAGINAL DISCHARGE OR ITCHING   Items with * indicate a potential emergency and should be followed up as soon as possible or go to the Emergency Department if any problems should occur.  Please show the CHEMOTHERAPY ALERT CARD  or IMMUNOTHERAPY ALERT CARD at check-in to the Emergency Department and triage nurse.  Should you have questions after your visit or need to cancel or reschedule your appointment, please contact [facility]  Dept: [phone]  and follow the prompts.  Office hours are 8:00 a.m. to 4:30 p.m. Monday - Friday. Please note that voicemails left after 4:00 p.m. may not be returned until the following business day.  We are closed weekends and major holidays. You have access to a nurse at all times for urgent questions. Please call the main number to the clinic Dept: [phone] and follow the prompts.   For any non-urgent questions, you may also contact your provider using MyChart. We now offer e-Visits for anyone 18 and older to request care online for non-urgent symptoms. For details visit [email or web address].   Also download the MyChart app! Go to the app store, search "MyChart", open the app, select [facility], and log in with your MyChart username and password.  

## 2023-03-02 NOTE — Progress Notes (Signed)
[facility] OFFICE PROGRESS NOTE  Associates, [facility] [address] [place] [address]  DIAGNOSIS: Multiple myeloma  PRIOR THERAPY: Hematological/Oncological History # IgG Lambda Multiple Myeloma  06/14/2022: establish care with Zoe [name] due to anemia. Labs showed M protein 4.9, Kappa 7.2, Lambda 10.8, ratio 0.67 07/13/2022: Bmbx showed Lambda restricted plasma cell neoplasm involving approximately 60% of the cellular marrow by IHC on the biopsy.  08/01/2022: Cycle 1 Day 1 of VRd chemotherapy. (Holding revlimid initially) 08/21/2022:  Cycle 2 Day 1 of VRd chemotherapy. (Holding revlimid) 09/04/2022: Cycle 3 Day 1 of VRd chemotherapy. Started revlimid 10/03/2022: Cycle 4 Day 1 of VRd chemotherapy 10/31/2022: Cycle 5 Day 1 of VRd chemotherapy 11/20/2022: Cycle 6 Day 1 of VRd chemotherapy 12/11/2022: Cycle 7 Day 1 of VRd chemotherapy 01/02/2023: Cycle 8 Day 1 of VRd chemotherapy 01/22/2023: Cycle 9 Day 1 of VRd chemotherapy 02/23/2023: Day 1 Cycle 1 of Dara/Rev/Dex   CURRENT THERAPY: Darzalex, Revlimid, and decadron   INTERVAL HISTORY: Zoe [name] 89 y.o. female returns to the clinic today for a follow-up visit accompanied by her daughter-in-law. She was last seen by Zoe Heillingoetter PA-C on 02/23/2023. In the interim, she started Cycle 1, Day 1 of Dara/Rev/Dex.   Zoe [name] reports that she tolerated the treatment well without any side effects. She reports the rash associated with prior therapy has nearly resolved. She reports her energy and appetite are stable. She denies nuasea, vomiting or abdominal pain. Her bowel habits are unchanged without recurrent episodes of diarrhea or constipation. She reports yesterday she had an episode of tremors in the right upper extremity and slurred speech that lasted a few minutes. She denies any residual symptoms after the episode. She reports the pain in her legs have improved but has noticed more neuropathy in  her toes and feet. She is compliant with taking gabapentin as prescribed. She denies fevers, chills, sweats, shortness of breath, chest pain or cough. She has no other complaints. She is willing to proceed with treatment today.   MEDICAL HISTORY: Past Medical History:  Diagnosis Date   Arthritis    some - per patient   Breast cancer (HCC)    breast cancer / left    Cataract    bilat    GERD (gastroesophageal reflux disease)    History of kidney stones    Hyperlipidemia    Hypertension    Hypothyroidism    Macular degeneration    Left   S/P TAVR (transcatheter aortic valve replacement) 09/03/2018   23 mm Edwards Sapien 3 transcatheter heart valve placed via percutaneous right transfemoral approach    Severe aortic stenosis    Stress incontinence    Thyroid disease    Tinnitus     ALLERGIES:  is allergic to penicillins and sulfa antibiotics.  MEDICATIONS:  Current Outpatient Medications  Medication Sig Dispense Refill   amiodarone (PACERONE) 200 MG tablet Take 1 tablet (200 mg total) by mouth daily. One tab twice daily for 10 days then one tab daily 40 tablet 2   apixaban (ELIQUIS) 5 MG TABS tablet Take 1 tablet (5 mg total) by mouth 2 (two) times daily. 180 tablet 1   Artificial Tear Solution (SOOTHE XP) SOLN Place 1 drop into both eyes every evening.     Cholecalciferol (VITAMIN D3) 50 MCG (2000 UT) capsule Take 1 capsule (2,000 Units total) by mouth daily.     [employer] VITAMIN B12 1000 MCG tablet TAKE 1 TABLET BY MOUTH EVERY DAY 90  tablet 1   dapagliflozin propanediol (FARXIGA) 10 MG TABS tablet TAKE 1 TABLET BY MOUTH EVERY DAY 90 tablet 2   dexamethasone (DECADRON) 4 MG tablet Take 5 tablets (20 mg total) by mouth once a week. Take 20 mg (5 tablets) on day of multiple myeloma treatment 20 tablet 5   esomeprazole (NEXIUM) 20 MG capsule Take 20 mg by mouth daily as needed (Heartburn).     furosemide (LASIX) 40 MG tablet Take 40 mg by mouth daily as needed for fluid.     gabapentin  (NEURONTIN) 300 MG capsule Take 3 capsules (900 mg total) by mouth 2 (two) times daily. (Patient taking differently: Take 1-2 capsules by mouth 2 (two) times daily. 200mg  in the morning and 300mg  in the evening) 180 capsule 1   hydrocortisone cream 1 % Apply 1 Application topically 2 (two) times daily. 120 g 0   lenalidomide (REVLIMID) 25 MG capsule Take 1 capsule (25 mg total) by mouth daily. Take for 14 days,then none for 7 days.Repeat every 21 days. Auth #  [id number] Date obtained 02/28/23 14 capsule 0   levothyroxine (SYNTHROID, LEVOTHROID) 100 MCG tablet Take 100 mcg by mouth daily before breakfast.  2   metoprolol succinate (TOPROL-XL) 25 MG 24 hr tablet Take 1 tablet (25 mg total) by mouth daily. 30 tablet 2   OVER THE COUNTER MEDICATION Take 1 tablet by mouth daily. AREDS     polyethylene glycol (MIRALAX / GLYCOLAX) 17 g packet Take 17 g by mouth daily as needed for mild constipation.     potassium chloride (KLOR-CON M10) 10 MEQ tablet Take 2 tablets (20 mEq total) by mouth 2 (two) times daily. 180 tablet 1   pravastatin (PRAVACHOL) 40 MG tablet Take 40 mg by mouth every evening.     prochlorperazine (COMPAZINE) 10 MG tablet Take 1 tablet (10 mg total) by mouth every 6 (six) hours as needed for nausea or vomiting. 30 tablet 0   sennosides-docusate sodium (SENOKOT-S) 8.6-50 MG tablet Take 1 tablet by mouth daily.     triamcinolone ointment (KENALOG) 0.5 % Apply 1 Application topically 2 (two) times daily. 30 g 0   No current facility-administered medications for this visit.    SURGICAL HISTORY:  Past Surgical History:  Procedure Laterality Date   ABDOMINAL HYSTERECTOMY  1970's   BACK SURGERY     BREAST LUMPECTOMY  12/1998   lumpectomy   CARDIAC CATHETERIZATION     EYE SURGERY     cataract surgery bilat    INTRAOPERATIVE TRANSTHORACIC ECHOCARDIOGRAM N/A 09/03/2018   Procedure: INTRAOPERATIVE TRANSTHORACIC ECHOCARDIOGRAM;  Surgeon: Kathleene Hazel, MD;  Location: [facility] OR;  Service:  Open Heart Surgery;  Laterality: N/A;   KYPHOPLASTY N/A 09/07/2022   Procedure: THORACIC EIGHT KYPHOPLASTY;  Surgeon: Venita Lick, MD;  Location: [facility] OR;  Service: Orthopedics;  Laterality: N/A;  1 hr Local with IV [facility] 3 C-Bed   LITHOTRIPSY     Right total knee     2018 Dr. Charlann Boxer   RIGHT/LEFT HEART CATH AND CORONARY ANGIOGRAPHY N/A 08/06/2018   Procedure: RIGHT/LEFT HEART CATH AND CORONARY ANGIOGRAPHY;  Surgeon: Yates Decamp, MD;  Location: [facility] INVASIVE CV LAB;  Service: Cardiovascular;  Laterality: N/A;   THYROIDECTOMY, PARTIAL  1975   TONSILLECTOMY     as a child - patient not sure of exact date   TOTAL KNEE ARTHROPLASTY Left 03/13/2016   Procedure: TOTAL KNEE ARTHROPLASTY;  Surgeon: Durene Romans, MD;  Location: [facility] ORS;  Service: Orthopedics;  Laterality: Left;  TOTAL KNEE ARTHROPLASTY Right 06/18/2017   Procedure: RIGHT TOTAL KNEE ARTHROPLASTY;  Surgeon: Durene Romans, MD;  Location: [facility] ORS;  Service: Orthopedics;  Laterality: Right;   TRANSCATHETER AORTIC VALVE REPLACEMENT, TRANSFEMORAL N/A 09/03/2018   Procedure: TRANSCATHETER AORTIC VALVE REPLACEMENT, TRANSFEMORAL;  Surgeon: Kathleene Hazel, MD;  Location: [facility] OR;  Service: Open Heart Surgery;  Laterality: N/A;   REVIEW OF SYSTEMS:   Constitutional: ( - ) fevers, ( - )  chills , ( - ) night sweats Eyes: ( - ) blurriness of vision, ( - ) double vision, ( - ) watery eyes Ears, nose, mouth, throat, and face: ( - ) mucositis, ( - ) sore throat Respiratory: ( - ) cough, ( - ) dyspnea, ( - ) wheezes Cardiovascular: ( - ) palpitation, ( - ) chest discomfort, ( +) lower extremity swelling Gastrointestinal:  ( - ) nausea, ( - ) heartburn, ( - ) change in bowel habits Skin: ( - ) abnormal skin rashes Lymphatics: ( - ) new lymphadenopathy, ( - ) easy bruising Neurological: ( - ) numbness, ( - ) tingling, ( - ) new weaknesses Behavioral/Psych: ( - ) mood change, ( - ) new changes  All other systems were reviewed with the patient and  are negative.    PHYSICAL EXAMINATION:  Blood pressure 123/71, pulse 79, temperature 98.6 F (37 C), temperature source Temporal, resp. rate 15, weight 152 lb (68.9 kg), SpO2 97 %.  ECOG PERFORMANCE STATUS: 1  GENERAL: well appearing elderly Caucasian female, alert, no distress and comfortable SKIN: skin color, texture, turgor are normal, no rashes or significant lesions EYES: conjunctiva are pink and non-injected, sclera clear LUNGS: clear to auscultation and percussion with normal breathing effort HEART: irregular rhythm ,normal rate and no murmurs. +1 bilateral lower extremity edema starting below the knee to ankles.  Musculoskeletal: no cyanosis of digits and no clubbing  PSYCH: alert & oriented x 3, fluent speech NEURO: no focal motor/sensory deficits    LABORATORY DATA: Lab Results  Component Value Date   WBC 8.9 03/02/2023   HGB 14.1 03/02/2023   HCT 41.8 03/02/2023   MCV 88.0 03/02/2023   PLT 151 03/02/2023      Chemistry      Component Value Date/Time   NA 134 (L) 03/02/2023 1115   NA 130 (L) 06/19/2022 1012   K 4.0 03/02/2023 1115   CL 99 03/02/2023 1115   CO2 26 03/02/2023 1115   BUN 30 (H) 03/02/2023 1115   BUN 21 06/19/2022 1012   CREATININE 0.77 03/02/2023 1115      Component Value Date/Time   CALCIUM 8.4 (L) 03/02/2023 1115   ALKPHOS 70 03/02/2023 1115   AST 12 (L) 03/02/2023 1115   ALT 12 03/02/2023 1115   BILITOT 0.8 03/02/2023 1115       RADIOGRAPHIC STUDIES:  CT ABDOMEN PELVIS WO CONTRAST  Result Date: 02/17/2023 CLINICAL DATA:  Abdominal pain, acute, nonlocalized abd pain, distension, EXAM: CT ABDOMEN AND PELVIS WITHOUT CONTRAST TECHNIQUE: Multidetector CT imaging of the abdomen and pelvis was performed following the standard protocol without IV contrast. RADIATION DOSE REDUCTION: This exam was performed according to the departmental dose-optimization program which includes automated exposure control, adjustment of the mA and/or kV according  to patient size and/or use of iterative reconstruction technique. COMPARISON:  MR 09/23/2022 and previous FINDINGS: Lower chest: Post TAVR. Small pleural effusions. Dependent atelectasis posteriorly in both lower lobes. 9 mm nodule in the posterior right lower lobe (Im5,Se4) , new since  08/15/2018. Hepatobiliary: 6.7 cm low-attenuation lesion in hepatic segment 8 corresponding to hemangioma on MR 09/23/2022. No new liver lesion or biliary ductal dilatation. Gallbladder incompletely distended. No calcified gallstones. Pancreas: Unremarkable. No pancreatic ductal dilatation or surrounding inflammatory changes. Spleen: Normal in size without focal abnormality. Adrenals/Urinary Tract: No adrenal mass. Symmetric renal contours without urolithiasis. No hydronephrosis. Urinary bladder partially distended. Stomach/Bowel: Stomach is nondistended, unremarkable. Small bowel decompressed. Calcified appendicoliths without appendiceal wall thickening or regional inflammatory change. The colon is partially distended by gas and fecal material, without acute finding. Vascular/Lymphatic: Coarse aortoiliac calcified atheromatous plaque without aneurysm. No abdominal or pelvic adenopathy. Reproductive: Status post hysterectomy. No adnexal masses. Other: Bilateral pelvic phleboliths.  No ascites.  No free air. Musculoskeletal: Post vertebral augmentation T8 and T12. Multilevel lumbar spondylitic change with grade 1 anterolisthesis L4-5, presumably related to advanced facet DJD. No fracture or worrisome bone lesion. IMPRESSION: 1. No acute findings. 2. 9 mm right lower lobe pulmonary nodule. Per Fleischner Society Guidelines, recommend prompt non-contrast Chest CT for further evaluation. These guidelines do not apply to immunocompromised patients and patients with cancer. Follow up in patients with significant comorbidities as clinically warranted. For lung cancer screening, adhere to Lung-RADS guidelines. Reference: Radiology. 2017;  284(1):228-43. 3. Aortic Atherosclerosis (ICD10-I70.0). Electronically Signed   By: Corlis Leak M.D.   On: 02/17/2023 14:56   ECHOCARDIOGRAM COMPLETE  Result Date: 02/17/2023    ECHOCARDIOGRAM REPORT   Patient Name:   ALYSSUM RESTER Date of Exam: 02/17/2023 Medical Rec #:  [id number]           Height:       63.0 in Accession #:    [id number]          Weight:       160.0 lb Date of Birth:  04-04-1934            BSA:          1.759 m Patient Age:    89 years            BP:           162/76 mmHg Patient Gender: F                   HR:           76 bpm. Exam Location:  Inpatient Procedure: 2D Echo, Cardiac Doppler, Color Doppler and Strain Analysis Indications:    R07.9* Chest pain, unspecified  History:        Patient has prior history of Echocardiogram examinations, most                 recent 04/03/2022. CHF, Multiple, Aortic Valve Disease; Risk                 Factors:Hypertension, Dyslipidemia and Non-Smoker.  Sonographer:    Dondra Prader RVT RCS Referring Phys: [id number] [facility] CUSTOVIC  Sonographer Comments: Global longitudinal strain was attempted. IMPRESSIONS  1. Left ventricular ejection fraction, by estimation, is 55 to 60%. The left ventricle has normal function. The left ventricle has no regional wall motion abnormalities. There is mild left ventricular hypertrophy. Left ventricular diastolic parameters are consistent with Grade II diastolic dysfunction (pseudonormalization). Elevated left atrial pressure.  2. Right ventricular systolic function is normal. The right ventricular size is normal. There is mildly elevated pulmonary artery systolic pressure.  3. Left atrial size was severely dilated.  4. The mitral valve is abnormal. Mild mitral valve regurgitation. Moderate mitral stenosis.  5. The  tricuspid valve is abnormal.  6. Edwards Sapien 3 THV size 23 mm is in the AV position. . The aortic valve has been repaired/replaced. Aortic valve regurgitation is not visualized. No aortic stenosis is present.  7.  The inferior vena cava is dilated in size with >50% respiratory variability, suggesting right atrial pressure of 8 mmHg. FINDINGS  Left Ventricle: Left ventricular ejection fraction, by estimation, is 55 to 60%. The left ventricle has normal function. The left ventricle has no regional wall motion abnormalities. Global longitudinal strain performed but not reported based on interpreter judgement due to suboptimal tracking. The left ventricular internal cavity size was normal in size. There is mild left ventricular hypertrophy. Left ventricular diastolic parameters are consistent with Grade II diastolic dysfunction (pseudonormalization). Elevated left atrial pressure. Right Ventricle: The right ventricular size is normal. Right vetricular wall thickness was not well visualized. Right ventricular systolic function is normal. There is mildly elevated pulmonary artery systolic pressure. The tricuspid regurgitant velocity  is 2.92 m/s, and with an assumed right atrial pressure of 8 mmHg, the estimated right ventricular systolic pressure is 42.1 mmHg. Left Atrium: Left atrial size was severely dilated. Right Atrium: Right atrial size was normal in size. Pericardium: There is no evidence of pericardial effusion. Mitral Valve: The mitral valve is abnormal. There is mild thickening of the mitral valve leaflet(s). There is mild calcification of the mitral valve leaflet(s). Mild mitral annular calcification. Mild mitral valve regurgitation. Moderate mitral valve stenosis. The mean mitral valve gradient is 6.0 mmHg. Tricuspid Valve: The tricuspid valve is abnormal. Tricuspid valve regurgitation is mild . No evidence of tricuspid stenosis. Aortic Valve: Edwards Sapien 3 THV size 23 mm is in the AV position. The aortic valve has been repaired/replaced. Aortic valve regurgitation is not visualized. No aortic stenosis is present. Aortic valve mean gradient measures 10.0 mmHg. Aortic valve peak gradient measures 17.5 mmHg. Aortic  valve area, by VTI measures 2.30 cm. Pulmonic Valve: The pulmonic valve was not well visualized. Pulmonic valve regurgitation is mild. No evidence of pulmonic stenosis. Aorta: The aortic root is normal in size and structure. Venous: The inferior vena cava is dilated in size with greater than 50% respiratory variability, suggesting right atrial pressure of 8 mmHg. IAS/Shunts: No atrial level shunt detected by color flow Doppler.  LEFT VENTRICLE PLAX 2D LVIDd:         3.80 cm   Diastology LVIDs:         2.70 cm   LV e' medial:    5.52 cm/s LV PW:         1.20 cm   LV E/e' medial:  33.3 LV IVS:        1.20 cm   LV e' lateral:   5.56 cm/s LVOT diam:     1.60 cm   LV E/e' lateral: 33.1 LV SV:         81 LV SV Index:   46 LVOT Area:     2.01 cm  RIGHT VENTRICLE             IVC RV Basal diam:  4.00 cm     IVC diam: 2.30 cm RV Mid diam:    3.10 cm RV S prime:     13.30 cm/s TAPSE (M-mode): 2.5 cm LEFT ATRIUM             Index        RIGHT ATRIUM           Index LA diam:  4.20 cm 2.39 cm/m   RA Area:     16.60 cm LA Vol (A2C):   93.4 ml 53.11 ml/m  RA Volume:   38.50 ml  21.89 ml/m LA Vol (A4C):   88.6 ml 50.38 ml/m LA Biplane Vol: 92.3 ml 52.48 ml/m  AORTIC VALVE                     PULMONIC VALVE AV Area (Vmax):    1.80 cm      PV Vmax:       0.88 m/s AV Area (Vmean):   1.84 cm      PV Peak grad:  3.1 mmHg AV Area (VTI):     2.30 cm AV Vmax:           209.00 cm/s AV Vmean:          143.000 cm/s AV VTI:            0.351 m AV Peak Grad:      17.5 mmHg AV Mean Grad:      10.0 mmHg LVOT Vmax:         187.00 cm/s LVOT Vmean:        131.000 cm/s LVOT VTI:          0.401 m LVOT/AV VTI ratio: 1.14 AR Vena Contracta: 0.30 cm  AORTA Ao Root diam: 2.40 cm Ao Asc diam:  2.70 cm Ao Arch diam: 3.0 cm MITRAL VALVE                TRICUSPID VALVE MV Area (PHT): 2.62 cm     TR Peak grad:   34.1 mmHg MV Mean grad:  6.0 mmHg     TR Vmax:        292.00 cm/s MV Decel Time: 290 msec MV E velocity: 184.00 cm/s  SHUNTS MV A  velocity: 133.00 cm/s  Systemic VTI:  0.40 m MV E/A ratio:  1.38         Systemic Diam: 1.60 cm Dina Rich MD Electronically signed by Dina Rich MD Signature Date/Time: 02/17/2023/2:51:58 PM    Final    DG Chest 2 View  Result Date: 02/17/2023 CLINICAL DATA:  Chest pain EXAM: CHEST - 2 VIEW COMPARISON:  Chest x-ray May 15, 2022 FINDINGS: The patient is status post TAVR. Stable cardiomegaly. The hila and mediastinum are unchanged. No pneumothorax. No nodules or masses. No focal infiltrates. No overt edema. The patient is status post vertebroplasties at 2 levels. No acute bony changes. Possible tiny pleural effusions. IMPRESSION: 1. Possible tiny pleural effusions. No other acute abnormalities. 2. Stable cardiomegaly. Electronically Signed   By: Gerome Sam III M.D.   On: 02/17/2023 09:41     ASSESSMENT/PLAN:  Zoe [name] is a 88 y.o. female who presents to the clinic for evaluation for IgG lambda multiple myeloma.   # IgG Lambda Multiple Myeloma  --diagnosis of MM confirmed with bone marrow biopsy showing 60% plasma cells and anemia -- Bone survey shows no lytic lesions, kidney function is within normal limits.  --08/01/2022 was Cycle 1 Day 1 of VRd  -Dr. Leonides Schanz discontinued velcade on 02/05/23 due to rash. She is status post day 15 cycle #9.  -Dr. Leonides Schanz changed the care plan to Daratumumab, Revlimid, and decadron. She is scheduled for day 1 cycle #1 today  -Started Cycle 1, Day 1 of Dara/Rev/Dex on 02/23/2023.  Plan:  --Due for Cycle 1, Day 8 of Dara/Rev/Dex today.  --Labs show white blood cell  count 8.9, hemoglobin 14.1, MCV 88.0, and platelets of 151 --last M protein from 02/05/2023 trended down to 0.3 (4.9 prior to start of therapy). Normalized SFLC.  --RTC in 1 week with interval weekly treatment.     # Rash--improved -- Rash is not painful or itchy. -- Prescribed triamcinolone ointment to see if this helps with her rash.   #Hypotension/BP/Atrial Fibrillation   --Follows with cardiology, Dr. Jacinto Halim --Saw cardiology yesterday. Recommendations include increase metoprolol succinate from 12.5 to 25 mg PO daily and restarted patient back on amiodarone 200 mg PO daily x 10 days followed by 12 mg daily.  --Continue on Eliquis 5 mg BID   #Bilateral lower extremity edema:  --Continue to wear compression stockings.    # Normocytic Anemia #Vitamin B12 Deficiency --Anemia likely driven by multiple myeloma, but may be component of vitamin B12 deficiency as well. --initial labs showed elevated MMA with B12 180 -- Continue vitamin B12 1000 mcg p.o. daily -- Continue to monitor   #Left leg/thigh neuropathy: --Currently on gabapentin 900 mg nightly and 600 mg in the morning --Encouraged to try water aerobics and stretches.  --Following with Dr. Debbe Bales (ortho) to see if she is a candidate for steroid injections.  -- Patient evaluated by Dr. Barbaraann Cao thinks that this may be neuropathy worsened by her Velcade --Due to worsening neuropathy in feet after discontinuing velcade, we will request a follow up with Dr. Barbaraann Cao for further recommendations.    #Episode of right UE tremor and speech abnormality: --Etiology includes hyper/hypoglycemia, hyponatremia, TIA --We will request a follow up with Dr. Barbaraann Cao --Educated patient to seek immediate evaluate if there is repeat episodes. --No neurological deficits identified per exam today  #Upcoming dental procedures: --Encouraged patient to follow up with dentist as what type of clearance they require --Likely need a clearance letter from Dr. Jacinto Halim on if and when she can hold Eliquis.  --From myeloma perspective, okay to proceed with dental procedure but might need to hold revlimid temporarily based on how invasive the procedure will be.    #Supportive Care -- chemotherapy education complete.  -- port placement not required.  --Awaiting to start Zometa/Xgeva  -- zofran 8mg  q8H PRN and compazine 10mg  PO q6H for  nausea -- acyclovir 400mg  PO BID for VCZ prophylaxis -- tylenol 1000 mg q8H PRN for back pain.     No orders of the defined types were placed in this encounter.   Patient expressed understanding of the plan provided.   I have spent a total of 30 minutes minutes of face-to-face and non-face-to-face time, preparing to see the patient, performing a medically appropriate examination, counseling and educating the patient, ordering tests/procedures, documenting clinical information in the electronic health record, and care coordination.   Zoe Kaufmann PA-C Dept of Hematology and Oncology [facility] Phone: [phone]

## 2023-03-06 ENCOUNTER — Other Ambulatory Visit: Payer: Medicare Other

## 2023-03-06 ENCOUNTER — Ambulatory Visit: Payer: Medicare Other

## 2023-03-06 ENCOUNTER — Ambulatory Visit: Payer: Medicare Other | Admitting: Physician Assistant

## 2023-03-07 ENCOUNTER — Ambulatory Visit: Payer: Medicare Other | Admitting: Podiatry

## 2023-03-08 ENCOUNTER — Institutional Professional Consult (permissible substitution): Payer: Medicare Other | Admitting: Emergency Medicine

## 2023-03-09 ENCOUNTER — Other Ambulatory Visit: Payer: Self-pay

## 2023-03-09 ENCOUNTER — Inpatient Hospital Stay: Payer: Medicare Other

## 2023-03-09 ENCOUNTER — Inpatient Hospital Stay: Payer: Medicare Other | Admitting: Hematology and Oncology

## 2023-03-09 VITALS — BP 114/56 | HR 79 | Temp 98.3°F | Resp 17 | Ht 63.0 in | Wt 153.8 lb

## 2023-03-09 DIAGNOSIS — G620 Drug-induced polyneuropathy: Secondary | ICD-10-CM | POA: Diagnosis not present

## 2023-03-09 DIAGNOSIS — R251 Tremor, unspecified: Secondary | ICD-10-CM | POA: Diagnosis not present

## 2023-03-09 DIAGNOSIS — C9000 Multiple myeloma not having achieved remission: Secondary | ICD-10-CM | POA: Diagnosis not present

## 2023-03-09 DIAGNOSIS — I4891 Unspecified atrial fibrillation: Secondary | ICD-10-CM | POA: Diagnosis not present

## 2023-03-09 DIAGNOSIS — D649 Anemia, unspecified: Secondary | ICD-10-CM | POA: Diagnosis not present

## 2023-03-09 DIAGNOSIS — R21 Rash and other nonspecific skin eruption: Secondary | ICD-10-CM | POA: Diagnosis not present

## 2023-03-09 DIAGNOSIS — Z7901 Long term (current) use of anticoagulants: Secondary | ICD-10-CM | POA: Diagnosis not present

## 2023-03-09 DIAGNOSIS — E538 Deficiency of other specified B group vitamins: Secondary | ICD-10-CM | POA: Diagnosis not present

## 2023-03-09 DIAGNOSIS — R059 Cough, unspecified: Secondary | ICD-10-CM | POA: Diagnosis not present

## 2023-03-09 DIAGNOSIS — I5030 Unspecified diastolic (congestive) heart failure: Secondary | ICD-10-CM | POA: Diagnosis not present

## 2023-03-09 DIAGNOSIS — I959 Hypotension, unspecified: Secondary | ICD-10-CM | POA: Diagnosis not present

## 2023-03-09 DIAGNOSIS — Z5112 Encounter for antineoplastic immunotherapy: Secondary | ICD-10-CM | POA: Diagnosis not present

## 2023-03-09 LAB — CBC WITH DIFFERENTIAL (CANCER CENTER ONLY)
Abs Immature Granulocytes: 0.01 10*3/uL (ref 0.00–0.07)
Basophils Absolute: 0 10*3/uL (ref 0.0–0.1)
Basophils Relative: 1 %
Eosinophils Absolute: 0.3 10*3/uL (ref 0.0–0.5)
Eosinophils Relative: 6 %
HCT: 39.8 % (ref 36.0–46.0)
Hemoglobin: 13.4 g/dL (ref 12.0–15.0)
Immature Granulocytes: 0 %
Lymphocytes Relative: 2 %
Lymphs Abs: 0.1 10*3/uL — ABNORMAL LOW (ref 0.7–4.0)
MCH: 29.5 pg (ref 26.0–34.0)
MCHC: 33.7 g/dL (ref 30.0–36.0)
MCV: 87.5 fL (ref 80.0–100.0)
Monocytes Absolute: 0.6 10*3/uL (ref 0.1–1.0)
Monocytes Relative: 15 %
Neutro Abs: 3.1 10*3/uL (ref 1.7–7.7)
Neutrophils Relative %: 76 %
Platelet Count: 126 10*3/uL — ABNORMAL LOW (ref 150–400)
RBC: 4.55 MIL/uL (ref 3.87–5.11)
RDW: 17.1 % — ABNORMAL HIGH (ref 11.5–15.5)
WBC Count: 4.2 10*3/uL (ref 4.0–10.5)
nRBC: 0 % (ref 0.0–0.2)

## 2023-03-09 LAB — CMP (CANCER CENTER ONLY)
ALT: 16 U/L (ref 0–44)
AST: 10 U/L — ABNORMAL LOW (ref 15–41)
Albumin: 3.5 g/dL (ref 3.5–5.0)
Alkaline Phosphatase: 74 U/L (ref 38–126)
Anion gap: 8 (ref 5–15)
BUN: 18 mg/dL (ref 8–23)
CO2: 26 mmol/L (ref 22–32)
Calcium: 8.3 mg/dL — ABNORMAL LOW (ref 8.9–10.3)
Chloride: 98 mmol/L (ref 98–111)
Creatinine: 0.78 mg/dL (ref 0.44–1.00)
GFR, Estimated: >60 mL/min (ref >60)
Glucose, Bld: 120 mg/dL — ABNORMAL HIGH (ref 70–99)
Potassium: 4 mmol/L (ref 3.5–5.1)
Sodium: 132 mmol/L — ABNORMAL LOW (ref 135–145)
Total Bilirubin: 1 mg/dL (ref 0.3–1.2)
Total Protein: 5.9 g/dL — ABNORMAL LOW (ref 6.5–8.1)

## 2023-03-09 LAB — LACTATE DEHYDROGENASE: LDH: 206 U/L — ABNORMAL HIGH (ref 98–192)

## 2023-03-09 MED ORDER — DIPHENHYDRAMINE HCL 25 MG PO CAPS
50.0000 mg | ORAL_CAPSULE | Freq: Once | ORAL | Status: AC
  Administered 2023-03-09: 50 mg via ORAL
  Filled 2023-03-09: qty 2

## 2023-03-09 MED ORDER — ACETAMINOPHEN 325 MG PO TABS
650.0000 mg | ORAL_TABLET | Freq: Once | ORAL | Status: AC
  Administered 2023-03-09: 650 mg via ORAL
  Filled 2023-03-09: qty 2

## 2023-03-09 MED ORDER — DEXAMETHASONE 4 MG PO TABS: 20.0000 mg | ORAL_TABLET | Freq: Once | ORAL | Status: DC

## 2023-03-09 MED ORDER — MONTELUKAST SODIUM 10 MG PO TABS
10.0000 mg | ORAL_TABLET | Freq: Once | ORAL | Status: AC
  Administered 2023-03-09: 10 mg via ORAL
  Filled 2023-03-09: qty 1

## 2023-03-09 MED ORDER — DARATUMUMAB-HYALURONIDASE-FIHJ 1800-30000 MG-UT/15ML ~~LOC~~ SOLN
1800.0000 mg | Freq: Once | SUBCUTANEOUS | Status: AC
  Administered 2023-03-09: 1800 mg via SUBCUTANEOUS
  Filled 2023-03-09: qty 15

## 2023-03-09 NOTE — Patient Instructions (Signed)
[facility]  Discharge Instructions: Thank you for choosing [facility] to provide your oncology and hematology care.   If you have a lab appointment with the Cancer Center, please go directly to the Cancer Center and check in at the registration area.   Wear comfortable clothing and clothing appropriate for easy access to any Portacath or PICC line.   We strive to give you quality time with your provider. You may need to reschedule your appointment if you arrive late (15 or more minutes).  Arriving late affects you and other patients whose appointments are after yours.  Also, if you miss three or more appointments without notifying the office, you may be dismissed from the clinic at the provider's discretion.      For prescription refill requests, have your pharmacy contact our office and allow 72 hours for refills to be completed.    Today you received the following chemotherapy and/or immunotherapy agents Darzalex Faspro      To help prevent nausea and vomiting after your treatment, we encourage you to take your nausea medication as directed.  BELOW ARE SYMPTOMS THAT SHOULD BE REPORTED IMMEDIATELY: *FEVER GREATER THAN 100.4 F (38 C) OR HIGHER *CHILLS OR SWEATING *NAUSEA AND VOMITING THAT IS NOT CONTROLLED WITH YOUR NAUSEA MEDICATION *UNUSUAL SHORTNESS OF BREATH *UNUSUAL BRUISING OR BLEEDING *URINARY PROBLEMS (pain or burning when urinating, or frequent urination) *BOWEL PROBLEMS (unusual diarrhea, constipation, pain near the anus) TENDERNESS IN MOUTH AND THROAT WITH OR WITHOUT PRESENCE OF ULCERS (sore throat, sores in mouth, or a toothache) UNUSUAL RASH, SWELLING OR PAIN  UNUSUAL VAGINAL DISCHARGE OR ITCHING   Items with * indicate a potential emergency and should be followed up as soon as possible or go to the Emergency Department if any problems should occur.  Please show the CHEMOTHERAPY ALERT CARD or IMMUNOTHERAPY ALERT CARD at  check-in to the Emergency Department and triage nurse.  Should you have questions after your visit or need to cancel or reschedule your appointment, please contact [facility]  Dept: [phone]  and follow the prompts.  Office hours are 8:00 a.m. to 4:30 p.m. Monday - Friday. Please note that voicemails left after 4:00 p.m. may not be returned until the following business day.  We are closed weekends and major holidays. You have access to a nurse at all times for urgent questions. Please call the main number to the clinic Dept: [phone] and follow the prompts.   For any non-urgent questions, you may also contact your provider using MyChart. We now offer e-Visits for anyone 18 and older to request care online for non-urgent symptoms. For details visit [email or web address].   Also download the MyChart app! Go to the app store, search "MyChart", open the app, select [facility], and log in with your MyChart username and password.  

## 2023-03-09 NOTE — Progress Notes (Signed)
[facility] [phone]   [phone]   PROGRESS NOTE   Patient Care Team: Associates, [facility] as PCP - General (Rheumatology)   Hematological/Oncological History # IgG Lambda Multiple Myeloma  06/14/2022: establish care with Zoe [name] due to anemia. Labs showed M protein 4.9, Kappa 7.2, Lambda 10.8, ratio 0.67 07/13/2022: Bmbx showed Lambda restricted plasma cell neoplasm involving approximately 60% of the cellular marrow by IHC on the biopsy.  08/01/2022: Cycle 1 Day 1 of VRd chemotherapy. (Holding revlimid initially) 08/21/2022:  Cycle 2 Day 1 of VRd chemotherapy. (Holding revlimid) 09/04/2022: Cycle 3 Day 1 of VRd chemotherapy. Started revlimid 10/03/2022: Cycle 4 Day 1 of VRd chemotherapy 10/31/2022: Cycle 5 Day 1 of VRd chemotherapy 11/20/2022: Cycle 6 Day 1 of VRd chemotherapy 12/11/2022: Cycle 7 Day 1 of VRd chemotherapy 01/02/2023: Cycle 8 Day 1 of VRd chemotherapy 01/22/2023: Cycle 9 Day 1 of VRd chemotherapy 02/23/2023: Day 1 Cycle 1 of Dara/Rev/Dex    INTERVAL HISTORY: Zoe [name] 89 y.o. female returns to the clinic today for a follow-up visit accompanied by her daughter-in-law. Her last visit was on 03/02/2023. In the interim, she continued Cycle 1 of Dara/Rev/Dex.   Zoe [name] reports she is tolerating the Darzalex therapy well without any new symptoms.  She reports that she will be having a dental visit shortly to have a bridge replaced.  She notes that she did have a chest cold recently and was coughing up some mucus.  Her symptoms are resolving.  She did visit with her general practitioner yesterday and was given an expectorant to help expel her mucus.  She notes that overall her pain is under good control.  Her primary symptom at this time is fatigue.  Her rash has nearly completely cleared up and she has not required the steroid cream.  She is having some occasional constipation and balance issues but otherwise no nausea,  vomiting, or diarrhea.  She denies fevers, chills, sweats, shortness of breath, chest pain or cough. She has no other complaints. She is willing to proceed with treatment today.   MEDICAL HISTORY: Past Medical History:  Diagnosis Date   Arthritis    some - per patient   Breast cancer (HCC)    breast cancer / left    Cataract    bilat    GERD (gastroesophageal reflux disease)    History of kidney stones    Hyperlipidemia    Hypertension    Hypothyroidism    Macular degeneration    Left   S/P TAVR (transcatheter aortic valve replacement) 09/03/2018   23 mm Edwards Sapien 3 transcatheter heart valve placed via percutaneous right transfemoral approach    Severe aortic stenosis    Stress incontinence    Thyroid disease    Tinnitus     ALLERGIES:  is allergic to penicillins and sulfa antibiotics.  MEDICATIONS:  Current Outpatient Medications  Medication Sig Dispense Refill   amiodarone (PACERONE) 200 MG tablet Take 1 tablet (200 mg total) by mouth daily. One tab twice daily for 10 days then one tab daily 40 tablet 2   apixaban (ELIQUIS) 5 MG TABS tablet Take 1 tablet (5 mg total) by mouth 2 (two) times daily. 180 tablet 1   Artificial Tear Solution (SOOTHE XP) SOLN Place 1 drop into both eyes every evening.     Cholecalciferol (VITAMIN D3) 50 MCG (2000 UT) capsule Take 1 capsule (2,000 Units total) by mouth daily.     [employer] VITAMIN B12 1000  MCG tablet TAKE 1 TABLET BY MOUTH EVERY DAY 90 tablet 1   dapagliflozin propanediol (FARXIGA) 10 MG TABS tablet TAKE 1 TABLET BY MOUTH EVERY DAY 90 tablet 2   dexamethasone (DECADRON) 4 MG tablet Take 5 tablets (20 mg total) by mouth once a week. Take 20 mg (5 tablets) on day of multiple myeloma treatment 20 tablet 5   esomeprazole (NEXIUM) 20 MG capsule Take 20 mg by mouth daily as needed (Heartburn).     furosemide (LASIX) 40 MG tablet Take 40 mg by mouth daily as needed for fluid.     gabapentin (NEURONTIN) 300 MG capsule Take 3 capsules (900 mg  total) by mouth 2 (two) times daily. (Patient taking differently: Take 1-2 capsules by mouth 2 (two) times daily. 200mg  in the morning and 300mg  in the evening) 180 capsule 1   hydrocortisone cream 1 % Apply 1 Application topically 2 (two) times daily. 120 g 0   lenalidomide (REVLIMID) 25 MG capsule Take 1 capsule (25 mg total) by mouth daily. Take for 14 days,then none for 7 days.Repeat every 21 days. Auth #  [id number] Date obtained 02/28/23 14 capsule 0   levothyroxine (SYNTHROID, LEVOTHROID) 100 MCG tablet Take 100 mcg by mouth daily before breakfast.  2   metoprolol succinate (TOPROL-XL) 25 MG 24 hr tablet Take 1 tablet (25 mg total) by mouth daily. 30 tablet 2   OVER THE COUNTER MEDICATION Take 1 tablet by mouth daily. AREDS     polyethylene glycol (MIRALAX / GLYCOLAX) 17 g packet Take 17 g by mouth daily as needed for mild constipation.     potassium chloride (KLOR-CON M10) 10 MEQ tablet Take 2 tablets (20 mEq total) by mouth 2 (two) times daily. 180 tablet 1   pravastatin (PRAVACHOL) 40 MG tablet Take 40 mg by mouth every evening.     prochlorperazine (COMPAZINE) 10 MG tablet Take 1 tablet (10 mg total) by mouth every 6 (six) hours as needed for nausea or vomiting. 30 tablet 0   sennosides-docusate sodium (SENOKOT-S) 8.6-50 MG tablet Take 1 tablet by mouth daily.     triamcinolone ointment (KENALOG) 0.5 % Apply 1 Application topically 2 (two) times daily. 30 g 0   No current facility-administered medications for this visit.    SURGICAL HISTORY:  Past Surgical History:  Procedure Laterality Date   ABDOMINAL HYSTERECTOMY  1970's   BACK SURGERY     BREAST LUMPECTOMY  12/1998   lumpectomy   CARDIAC CATHETERIZATION     EYE SURGERY     cataract surgery bilat    INTRAOPERATIVE TRANSTHORACIC ECHOCARDIOGRAM N/A 09/03/2018   Procedure: INTRAOPERATIVE TRANSTHORACIC ECHOCARDIOGRAM;  Surgeon: Kathleene Hazel, MD;  Location: [facility] OR;  Service: Open Heart Surgery;  Laterality: N/A;    KYPHOPLASTY N/A 09/07/2022   Procedure: THORACIC EIGHT KYPHOPLASTY;  Surgeon: Venita Lick, MD;  Location: [facility] OR;  Service: Orthopedics;  Laterality: N/A;  1 hr Local with IV [facility] 3 C-Bed   LITHOTRIPSY     Right total knee     2018 Dr. Charlann Boxer   RIGHT/LEFT HEART CATH AND CORONARY ANGIOGRAPHY N/A 08/06/2018   Procedure: RIGHT/LEFT HEART CATH AND CORONARY ANGIOGRAPHY;  Surgeon: Yates Decamp, MD;  Location: [facility] INVASIVE CV LAB;  Service: Cardiovascular;  Laterality: N/A;   THYROIDECTOMY, PARTIAL  1975   TONSILLECTOMY     as a child - patient not sure of exact date   TOTAL KNEE ARTHROPLASTY Left 03/13/2016   Procedure: TOTAL KNEE ARTHROPLASTY;  Surgeon: Durene Romans, MD;  Location: [facility] ORS;  Service: Orthopedics;  Laterality: Left;   TOTAL KNEE ARTHROPLASTY Right 06/18/2017   Procedure: RIGHT TOTAL KNEE ARTHROPLASTY;  Surgeon: Durene Romans, MD;  Location: [facility] ORS;  Service: Orthopedics;  Laterality: Right;   TRANSCATHETER AORTIC VALVE REPLACEMENT, TRANSFEMORAL N/A 09/03/2018   Procedure: TRANSCATHETER AORTIC VALVE REPLACEMENT, TRANSFEMORAL;  Surgeon: Kathleene Hazel, MD;  Location: [facility] OR;  Service: Open Heart Surgery;  Laterality: N/A;   REVIEW OF SYSTEMS:   Constitutional: ( - ) fevers, ( - )  chills , ( - ) night sweats Eyes: ( - ) blurriness of vision, ( - ) double vision, ( - ) watery eyes Ears, nose, mouth, throat, and face: ( - ) mucositis, ( - ) sore throat Respiratory: ( - ) cough, ( - ) dyspnea, ( - ) wheezes Cardiovascular: ( - ) palpitation, ( - ) chest discomfort, ( +) lower extremity swelling Gastrointestinal:  ( - ) nausea, ( - ) heartburn, ( - ) change in bowel habits Skin: ( - ) abnormal skin rashes Lymphatics: ( - ) new lymphadenopathy, ( - ) easy bruising Neurological: ( - ) numbness, ( - ) tingling, ( - ) new weaknesses Behavioral/Psych: ( - ) mood change, ( - ) new changes  All other systems were reviewed with the patient and are negative.    PHYSICAL  EXAMINATION:  There were no vitals taken for this visit.  ECOG PERFORMANCE STATUS: 1  GENERAL: well appearing elderly Caucasian female, alert, no distress and comfortable SKIN: skin color, texture, turgor are normal, no rashes or significant lesions EYES: conjunctiva are pink and non-injected, sclera clear LUNGS: clear to auscultation and percussion with normal breathing effort HEART: irregular rhythm ,normal rate and no murmurs. +1 bilateral lower extremity edema starting below the knee to ankles.  Musculoskeletal: no cyanosis of digits and no clubbing  PSYCH: alert & oriented x 3, fluent speech NEURO: no focal motor/sensory deficits    LABORATORY DATA: Lab Results  Component Value Date   WBC 8.9 03/02/2023   HGB 14.1 03/02/2023   HCT 41.8 03/02/2023   MCV 88.0 03/02/2023   PLT 151 03/02/2023      Chemistry      Component Value Date/Time   NA 134 (L) 03/02/2023 1115   NA 130 (L) 06/19/2022 1012   K 4.0 03/02/2023 1115   CL 99 03/02/2023 1115   CO2 26 03/02/2023 1115   BUN 30 (H) 03/02/2023 1115   BUN 21 06/19/2022 1012   CREATININE 0.77 03/02/2023 1115      Component Value Date/Time   CALCIUM 8.4 (L) 03/02/2023 1115   ALKPHOS 70 03/02/2023 1115   AST 12 (L) 03/02/2023 1115   ALT 12 03/02/2023 1115   BILITOT 0.8 03/02/2023 1115       RADIOGRAPHIC STUDIES:  CT ABDOMEN PELVIS WO CONTRAST  Result Date: 02/17/2023 CLINICAL DATA:  Abdominal pain, acute, nonlocalized abd pain, distension, EXAM: CT ABDOMEN AND PELVIS WITHOUT CONTRAST TECHNIQUE: Multidetector CT imaging of the abdomen and pelvis was performed following the standard protocol without IV contrast. RADIATION DOSE REDUCTION: This exam was performed according to the departmental dose-optimization program which includes automated exposure control, adjustment of the mA and/or kV according to patient size and/or use of iterative reconstruction technique. COMPARISON:  MR 09/23/2022 and previous FINDINGS: Lower chest:  Post TAVR. Small pleural effusions. Dependent atelectasis posteriorly in both lower lobes. 9 mm nodule in the posterior right lower lobe (Im5,Se4) , new since 08/15/2018. Hepatobiliary: 6.7 cm low-attenuation  lesion in hepatic segment 8 corresponding to hemangioma on MR 09/23/2022. No new liver lesion or biliary ductal dilatation. Gallbladder incompletely distended. No calcified gallstones. Pancreas: Unremarkable. No pancreatic ductal dilatation or surrounding inflammatory changes. Spleen: Normal in size without focal abnormality. Adrenals/Urinary Tract: No adrenal mass. Symmetric renal contours without urolithiasis. No hydronephrosis. Urinary bladder partially distended. Stomach/Bowel: Stomach is nondistended, unremarkable. Small bowel decompressed. Calcified appendicoliths without appendiceal wall thickening or regional inflammatory change. The colon is partially distended by gas and fecal material, without acute finding. Vascular/Lymphatic: Coarse aortoiliac calcified atheromatous plaque without aneurysm. No abdominal or pelvic adenopathy. Reproductive: Status post hysterectomy. No adnexal masses. Other: Bilateral pelvic phleboliths.  No ascites.  No free air. Musculoskeletal: Post vertebral augmentation T8 and T12. Multilevel lumbar spondylitic change with grade 1 anterolisthesis L4-5, presumably related to advanced facet DJD. No fracture or worrisome bone lesion. IMPRESSION: 1. No acute findings. 2. 9 mm right lower lobe pulmonary nodule. Per Fleischner Society Guidelines, recommend prompt non-contrast Chest CT for further evaluation. These guidelines do not apply to immunocompromised patients and patients with cancer. Follow up in patients with significant comorbidities as clinically warranted. For lung cancer screening, adhere to Lung-RADS guidelines. Reference: Radiology. 2017; 284(1):228-43. 3. Aortic Atherosclerosis (ICD10-I70.0). Electronically Signed   By: Corlis Leak M.D.   On: 02/17/2023 14:56    ECHOCARDIOGRAM COMPLETE  Result Date: 02/17/2023    ECHOCARDIOGRAM REPORT   Patient Name:   CHENELLE ELFERS Date of Exam: 02/17/2023 Medical Rec #:  [id number]           Height:       63.0 in Accession #:    [id number]          Weight:       160.0 lb Date of Birth:  1933/12/10            BSA:          1.759 m Patient Age:    89 years            BP:           162/76 mmHg Patient Gender: F                   HR:           76 bpm. Exam Location:  Inpatient Procedure: 2D Echo, Cardiac Doppler, Color Doppler and Strain Analysis Indications:    R07.9* Chest pain, unspecified  History:        Patient has prior history of Echocardiogram examinations, most                 recent 04/03/2022. CHF, Multiple, Aortic Valve Disease; Risk                 Factors:Hypertension, Dyslipidemia and Non-Smoker.  Sonographer:    Dondra Prader RVT RCS Referring Phys: [id number] [facility] CUSTOVIC  Sonographer Comments: Global longitudinal strain was attempted. IMPRESSIONS  1. Left ventricular ejection fraction, by estimation, is 55 to 60%. The left ventricle has normal function. The left ventricle has no regional wall motion abnormalities. There is mild left ventricular hypertrophy. Left ventricular diastolic parameters are consistent with Grade II diastolic dysfunction (pseudonormalization). Elevated left atrial pressure.  2. Right ventricular systolic function is normal. The right ventricular size is normal. There is mildly elevated pulmonary artery systolic pressure.  3. Left atrial size was severely dilated.  4. The mitral valve is abnormal. Mild mitral valve regurgitation. Moderate mitral stenosis.  5. The tricuspid valve is abnormal.  6. Edwards Sapien 3 THV size 23 mm is in the AV position. . The aortic valve has been repaired/replaced. Aortic valve regurgitation is not visualized. No aortic stenosis is present.  7. The inferior vena cava is dilated in size with >50% respiratory variability, suggesting right atrial pressure of 8 mmHg.  FINDINGS  Left Ventricle: Left ventricular ejection fraction, by estimation, is 55 to 60%. The left ventricle has normal function. The left ventricle has no regional wall motion abnormalities. Global longitudinal strain performed but not reported based on interpreter judgement due to suboptimal tracking. The left ventricular internal cavity size was normal in size. There is mild left ventricular hypertrophy. Left ventricular diastolic parameters are consistent with Grade II diastolic dysfunction (pseudonormalization). Elevated left atrial pressure. Right Ventricle: The right ventricular size is normal. Right vetricular wall thickness was not well visualized. Right ventricular systolic function is normal. There is mildly elevated pulmonary artery systolic pressure. The tricuspid regurgitant velocity  is 2.92 m/s, and with an assumed right atrial pressure of 8 mmHg, the estimated right ventricular systolic pressure is 42.1 mmHg. Left Atrium: Left atrial size was severely dilated. Right Atrium: Right atrial size was normal in size. Pericardium: There is no evidence of pericardial effusion. Mitral Valve: The mitral valve is abnormal. There is mild thickening of the mitral valve leaflet(s). There is mild calcification of the mitral valve leaflet(s). Mild mitral annular calcification. Mild mitral valve regurgitation. Moderate mitral valve stenosis. The mean mitral valve gradient is 6.0 mmHg. Tricuspid Valve: The tricuspid valve is abnormal. Tricuspid valve regurgitation is mild . No evidence of tricuspid stenosis. Aortic Valve: Edwards Sapien 3 THV size 23 mm is in the AV position. The aortic valve has been repaired/replaced. Aortic valve regurgitation is not visualized. No aortic stenosis is present. Aortic valve mean gradient measures 10.0 mmHg. Aortic valve peak gradient measures 17.5 mmHg. Aortic valve area, by VTI measures 2.30 cm. Pulmonic Valve: The pulmonic valve was not well visualized. Pulmonic valve  regurgitation is mild. No evidence of pulmonic stenosis. Aorta: The aortic root is normal in size and structure. Venous: The inferior vena cava is dilated in size with greater than 50% respiratory variability, suggesting right atrial pressure of 8 mmHg. IAS/Shunts: No atrial level shunt detected by color flow Doppler.  LEFT VENTRICLE PLAX 2D LVIDd:         3.80 cm   Diastology LVIDs:         2.70 cm   LV e' medial:    5.52 cm/s LV PW:         1.20 cm   LV E/e' medial:  33.3 LV IVS:        1.20 cm   LV e' lateral:   5.56 cm/s LVOT diam:     1.60 cm   LV E/e' lateral: 33.1 LV SV:         81 LV SV Index:   46 LVOT Area:     2.01 cm  RIGHT VENTRICLE             IVC RV Basal diam:  4.00 cm     IVC diam: 2.30 cm RV Mid diam:    3.10 cm RV S prime:     13.30 cm/s TAPSE (M-mode): 2.5 cm LEFT ATRIUM             Index        RIGHT ATRIUM           Index LA diam:  4.20 cm 2.39 cm/m   RA Area:     16.60 cm LA Vol (A2C):   93.4 ml 53.11 ml/m  RA Volume:   38.50 ml  21.89 ml/m LA Vol (A4C):   88.6 ml 50.38 ml/m LA Biplane Vol: 92.3 ml 52.48 ml/m  AORTIC VALVE                     PULMONIC VALVE AV Area (Vmax):    1.80 cm      PV Vmax:       0.88 m/s AV Area (Vmean):   1.84 cm      PV Peak grad:  3.1 mmHg AV Area (VTI):     2.30 cm AV Vmax:           209.00 cm/s AV Vmean:          143.000 cm/s AV VTI:            0.351 m AV Peak Grad:      17.5 mmHg AV Mean Grad:      10.0 mmHg LVOT Vmax:         187.00 cm/s LVOT Vmean:        131.000 cm/s LVOT VTI:          0.401 m LVOT/AV VTI ratio: 1.14 AR Vena Contracta: 0.30 cm  AORTA Ao Root diam: 2.40 cm Ao Asc diam:  2.70 cm Ao Arch diam: 3.0 cm MITRAL VALVE                TRICUSPID VALVE MV Area (PHT): 2.62 cm     TR Peak grad:   34.1 mmHg MV Mean grad:  6.0 mmHg     TR Vmax:        292.00 cm/s MV Decel Time: 290 msec MV E velocity: 184.00 cm/s  SHUNTS MV A velocity: 133.00 cm/s  Systemic VTI:  0.40 m MV E/A ratio:  1.38         Systemic Diam: 1.60 cm Dina Rich MD  Electronically signed by Dina Rich MD Signature Date/Time: 02/17/2023/2:51:58 PM    Final    DG Chest 2 View  Result Date: 02/17/2023 CLINICAL DATA:  Chest pain EXAM: CHEST - 2 VIEW COMPARISON:  Chest x-ray May 15, 2022 FINDINGS: The patient is status post TAVR. Stable cardiomegaly. The hila and mediastinum are unchanged. No pneumothorax. No nodules or masses. No focal infiltrates. No overt edema. The patient is status post vertebroplasties at 2 levels. No acute bony changes. Possible tiny pleural effusions. IMPRESSION: 1. Possible tiny pleural effusions. No other acute abnormalities. 2. Stable cardiomegaly. Electronically Signed   By: Gerome Sam III M.D.   On: 02/17/2023 09:41     ASSESSMENT/PLAN:  Zoe [name] is a 89 y.o. female who presents to the clinic for evaluation for IgG lambda multiple myeloma.   # IgG Lambda Multiple Myeloma  --diagnosis of MM confirmed with bone marrow biopsy showing 60% plasma cells and anemia -- Bone survey shows no lytic lesions, kidney function is within normal limits.  --08/01/2022 was Cycle 1 Day 1 of VRd  -Dr. Leonides Schanz discontinued velcade on 02/05/23 due to rash. She is status post day 15 cycle #9.  -Dr. Leonides Schanz changed the care plan to Daratumumab, Revlimid, and decadron. She is scheduled for day 1 cycle #1 today  -Started Cycle 1, Day 1 of Dara/Rev/Dex on 02/23/2023.  Plan:  --Due for Cycle 1, Day 15 of Dara/Rev/Dex today.  --Labs show white blood cell  count 3.3, hemoglobin 13.8, MCV 86.8, and platelets of 162 --last M protein from 02/05/2023 trended down to 0.3 (4.9 prior to start of therapy). Normalized SFLC.  --RTC in 2 weeks with interval weekly treatment.   # Rash--improved -- Rash is not painful or itchy. -- Prescribed triamcinolone ointment to see if this helps with her rash.   #Hypotension/BP/Atrial Fibrillation  --Follows with cardiology, Dr. Jacinto Halim --Saw cardiology yesterday. Recommendations include increase metoprolol succinate  from 12.5 to 25 mg PO daily and restarted patient back on amiodarone 200 mg PO daily x 10 days followed by 12 mg daily.  --Continue on Eliquis 5 mg BID   #Bilateral lower extremity edema:  --Continue to wear compression stockings.    # Normocytic Anemia #Vitamin B12 Deficiency --Anemia likely driven by multiple myeloma, but may be component of vitamin B12 deficiency as well. --initial labs showed elevated MMA with B12 180 -- Continue vitamin B12 1000 mcg p.o. daily -- Continue to monitor   #Left leg/thigh neuropathy: --Currently on gabapentin 900 mg nightly and 600 mg in the morning --Encouraged to try water aerobics and stretches.  --Following with Dr. Debbe Bales (ortho) to see if she is a candidate for steroid injections.  -- Patient evaluated by Dr. Barbaraann Cao thinks that this may be neuropathy worsened by her Velcade --Due to worsening neuropathy in feet after discontinuing velcade, we will request a follow up with Dr. Barbaraann Cao for further recommendations.    #Episode of right UE tremor and speech abnormality: --Etiology includes hyper/hypoglycemia, hyponatremia, TIA --We will request a follow up with Dr. Barbaraann Cao --Educated patient to seek immediate evaluate if there is repeat episodes. --No neurological deficits identified per exam today  #Upcoming dental procedures: --Encouraged patient to follow up with dentist as what type of clearance they require --Likely need a clearance letter from Dr. Jacinto Halim on if and when she can hold Eliquis.  --From myeloma perspective, okay to proceed with dental procedure but might need to hold revlimid temporarily based on how invasive the procedure will be.    #Supportive Care -- chemotherapy education complete.  -- port placement not required.  --Awaiting to start Zometa/Xgeva  -- zofran 8mg  q8H PRN and compazine 10mg  PO q6H for nausea -- acyclovir 400mg  PO BID for VCZ prophylaxis -- tylenol 1000 mg q8H PRN for back pain.     No orders of the defined  types were placed in this encounter.   Patient expressed understanding of the plan provided.   I have spent a total of 30 minutes minutes of face-to-face and non-face-to-face time, preparing to see the patient, performing a medically appropriate examination, counseling and educating the patient, ordering tests/procedures, documenting clinical information in the electronic health record, and care coordination.   Ulysees Barns, MD Department of Hematology/Oncology [facility] at [facility] Phone: [phone] Pager: [phone] Email: [email or web address]

## 2023-03-10 ENCOUNTER — Other Ambulatory Visit: Payer: Self-pay

## 2023-03-11 ENCOUNTER — Other Ambulatory Visit: Payer: Self-pay | Admitting: Hematology and Oncology

## 2023-03-12 ENCOUNTER — Inpatient Hospital Stay (HOSPITAL_BASED_OUTPATIENT_CLINIC_OR_DEPARTMENT_OTHER): Payer: Medicare Other | Admitting: Internal Medicine

## 2023-03-12 ENCOUNTER — Ambulatory Visit: Payer: Medicare Other

## 2023-03-12 ENCOUNTER — Encounter: Payer: Self-pay | Admitting: Hematology and Oncology

## 2023-03-12 ENCOUNTER — Other Ambulatory Visit: Payer: Medicare Other

## 2023-03-12 VITALS — BP 121/52 | HR 66 | Temp 98.0°F | Resp 17 | Ht 63.0 in | Wt 154.0 lb

## 2023-03-12 DIAGNOSIS — E538 Deficiency of other specified B group vitamins: Secondary | ICD-10-CM | POA: Diagnosis not present

## 2023-03-12 DIAGNOSIS — T451X5A Adverse effect of antineoplastic and immunosuppressive drugs, initial encounter: Secondary | ICD-10-CM

## 2023-03-12 DIAGNOSIS — R2990 Unspecified symptoms and signs involving the nervous system: Secondary | ICD-10-CM | POA: Diagnosis not present

## 2023-03-12 DIAGNOSIS — Z7901 Long term (current) use of anticoagulants: Secondary | ICD-10-CM | POA: Diagnosis not present

## 2023-03-12 DIAGNOSIS — I4891 Unspecified atrial fibrillation: Secondary | ICD-10-CM | POA: Diagnosis not present

## 2023-03-12 DIAGNOSIS — C9000 Multiple myeloma not having achieved remission: Secondary | ICD-10-CM

## 2023-03-12 DIAGNOSIS — G620 Drug-induced polyneuropathy: Secondary | ICD-10-CM | POA: Diagnosis not present

## 2023-03-12 DIAGNOSIS — D649 Anemia, unspecified: Secondary | ICD-10-CM | POA: Diagnosis not present

## 2023-03-12 DIAGNOSIS — R251 Tremor, unspecified: Secondary | ICD-10-CM | POA: Diagnosis not present

## 2023-03-12 DIAGNOSIS — R21 Rash and other nonspecific skin eruption: Secondary | ICD-10-CM | POA: Diagnosis not present

## 2023-03-12 DIAGNOSIS — I5030 Unspecified diastolic (congestive) heart failure: Secondary | ICD-10-CM | POA: Diagnosis not present

## 2023-03-12 DIAGNOSIS — Z5112 Encounter for antineoplastic immunotherapy: Secondary | ICD-10-CM | POA: Diagnosis not present

## 2023-03-12 DIAGNOSIS — I959 Hypotension, unspecified: Secondary | ICD-10-CM | POA: Diagnosis not present

## 2023-03-12 DIAGNOSIS — R059 Cough, unspecified: Secondary | ICD-10-CM | POA: Diagnosis not present

## 2023-03-12 NOTE — Progress Notes (Signed)
[facility] [address]  [place], [place] [address] [phone]   Interval Evaluation  Date of Service: 03/12/23 Patient Name: Zoe [name] Patient MRN: [id number] Patient DOB: [birth year] Provider: Henreitta Leber, MD  Identifying Statement:  Zoe [name] is a 89 y.o. female with Chemotherapy-induced neuropathy (HCC)   Primary Cancer:  Oncologic History: Oncology History  Multiple myeloma not having achieved remission (HCC)  07/24/2022 Initial Diagnosis   Multiple myeloma not having achieved remission (HCC)   08/01/2022 - 02/12/2023 Chemotherapy   Patient is on Treatment Plan : MYELOMA NON-TRANSPLANT CANDIDATES VRd weekly q21d     02/23/2023 -  Chemotherapy   Patient is on Treatment Plan : MYELOMA RELAPSED REFRACTORY Daratumumab SQ + Lenalidomide + Dexamethasone (DaraRd) q28d       Interval History: Zoe [name] presents today for neuropathy follow up.  She does dsecribe 2 discrete episodes of sudden onset "very confused, not making sense" per patient and family.  One was prior to admission in April, at which time atrial fibrillation was diagnosed and anticoagulation initiated.  The second episode was two weeks ago, while on full anticoagulation.  For neuropathy, she describes improvement in symptom burden since increasing the gabapentin to 900mg  at night.  She also doses 600mg  in the morning.  Stays active, is independent.  Continues on infusions with Dr. Leonides Schanz.  H+P (11/28/22) Patient presents to review neuropathy symptoms.  She describes burning type pain in her left leg, primarily.  There is also some numbness in her feet.  Onset was several months ago, since chemotherapy was started.  She has been walking independently, typically with a cane for chronic back issues.  She has been dosing lyrica 100mg  BID, but this has been ineffective.  Denies headaches, seizures.  Continues on velcade with Dr. Leonides Schanz for myeloma.       Medications: Current Outpatient Medications on File Prior to Visit  Medication Sig Dispense Refill   amiodarone (PACERONE) 200 MG tablet Take 1 tablet (200 mg total) by mouth daily. One tab twice daily for 10 days then one tab daily 40 tablet 2   apixaban (ELIQUIS) 5 MG TABS tablet Take 1 tablet (5 mg total) by mouth 2 (two) times daily. 180 tablet 1   Artificial Tear Solution (SOOTHE XP) SOLN Place 1 drop into both eyes every evening.     Cholecalciferol (VITAMIN D3) 50 MCG (2000 UT) capsule Take 1 capsule (2,000 Units total) by mouth daily.     [employer] VITAMIN B12 1000 MCG tablet TAKE 1 TABLET BY MOUTH EVERY DAY 90 tablet 1   dapagliflozin propanediol (FARXIGA) 10 MG TABS tablet TAKE 1 TABLET BY MOUTH EVERY DAY 90 tablet 2   dexamethasone (DECADRON) 4 MG tablet Take 5 tablets (20 mg total) by mouth once a week. Take 20 mg (5 tablets) on day of multiple myeloma treatment 20 tablet 5   esomeprazole (NEXIUM) 20 MG capsule Take 20 mg by mouth daily as needed (Heartburn).     furosemide (LASIX) 40 MG tablet Take 40 mg by mouth daily as needed for fluid.     gabapentin (NEURONTIN) 300 MG capsule Take 3 capsules (900 mg total) by mouth 2 (two) times daily. (Patient taking differently: Take 1-2 capsules by mouth 2 (two) times daily. 200mg  in the morning and 300mg  in the evening) 180 capsule 1   guaiFENesin (MUCINEX) 600 MG 12 hr tablet Take 600 mg by mouth 2 (two) times daily.  hydrocortisone cream 1 % Apply 1 Application topically 2 (two) times daily. 120 g 0   lenalidomide (REVLIMID) 25 MG capsule Take 1 capsule (25 mg total) by mouth daily. Take for 14 days,then none for 7 days.Repeat every 21 days. Auth #  [id number] Date obtained 02/28/23 14 capsule 0   levothyroxine (SYNTHROID, LEVOTHROID) 100 MCG tablet Take 100 mcg by mouth daily before breakfast.  2   metoprolol succinate (TOPROL-XL) 25 MG 24 hr tablet Take 1 tablet (25 mg total) by mouth daily. 30 tablet 2   OVER THE COUNTER MEDICATION Take 1  tablet by mouth daily. AREDS     polyethylene glycol (MIRALAX / GLYCOLAX) 17 g packet Take 17 g by mouth daily as needed for mild constipation.     potassium chloride (KLOR-CON M10) 10 MEQ tablet Take 2 tablets (20 mEq total) by mouth 2 (two) times daily. 180 tablet 1   pravastatin (PRAVACHOL) 40 MG tablet Take 40 mg by mouth every evening.     prochlorperazine (COMPAZINE) 10 MG tablet Take 1 tablet (10 mg total) by mouth every 6 (six) hours as needed for nausea or vomiting. 30 tablet 0   sennosides-docusate sodium (SENOKOT-S) 8.6-50 MG tablet Take 1 tablet by mouth daily.     triamcinolone ointment (KENALOG) 0.5 % Apply 1 Application topically 2 (two) times daily. 30 g 0   No current facility-administered medications on file prior to visit.    Allergies:  Allergies  Allergen Reactions   Penicillins Other (See Comments)    UNSPECIFIED REACTION  Patient does not remember reaction.  Has patient had a PCN reaction causing immediate rash, facial/tongue/throat swelling, SOB or lightheadedness with hypotension: no Has patient had a PCN reaction causing severe rash involving mucus membranes or skin necrosis: no Has patient had a PCN reaction that required hospitalization no Has patient had a PCN reaction occurring within the last 10 years: no If all of the above answers are "NO", then may proceed with Cephalosporin use.    Sulfa Antibiotics Other (See Comments)    UNSPECIFIED REACTION  "maybe vision issues? "   Past Medical History:  Past Medical History:  Diagnosis Date   Arthritis    some - per patient   Breast cancer (HCC)    breast cancer / left    Cataract    bilat    GERD (gastroesophageal reflux disease)    History of kidney stones    Hyperlipidemia    Hypertension    Hypothyroidism    Macular degeneration    Left   S/P TAVR (transcatheter aortic valve replacement) 09/03/2018   23 mm Edwards Sapien 3 transcatheter heart valve placed via percutaneous right transfemoral  approach    Severe aortic stenosis    Stress incontinence    Thyroid disease    Tinnitus    Past Surgical History:  Past Surgical History:  Procedure Laterality Date   ABDOMINAL HYSTERECTOMY  1970's   BACK SURGERY     BREAST LUMPECTOMY  12/1998   lumpectomy   CARDIAC CATHETERIZATION     EYE SURGERY     cataract surgery bilat    INTRAOPERATIVE TRANSTHORACIC ECHOCARDIOGRAM N/A 09/03/2018   Procedure: INTRAOPERATIVE TRANSTHORACIC ECHOCARDIOGRAM;  Surgeon: Kathleene Hazel, MD;  Location: [facility] OR;  Service: Open Heart Surgery;  Laterality: N/A;   KYPHOPLASTY N/A 09/07/2022   Procedure: THORACIC EIGHT KYPHOPLASTY;  Surgeon: Venita Lick, MD;  Location: [facility] OR;  Service: Orthopedics;  Laterality: N/A;  1 hr Local with [facility] 3  C-Bed   LITHOTRIPSY     Right total knee     2018 Dr. Charlann Boxer   RIGHT/LEFT HEART CATH AND CORONARY ANGIOGRAPHY N/A 08/06/2018   Procedure: RIGHT/LEFT HEART CATH AND CORONARY ANGIOGRAPHY;  Surgeon: Yates Decamp, MD;  Location: [facility] INVASIVE CV LAB;  Service: Cardiovascular;  Laterality: N/A;   THYROIDECTOMY, PARTIAL  1975   TONSILLECTOMY     as a child - patient not sure of exact date   TOTAL KNEE ARTHROPLASTY Left 03/13/2016   Procedure: TOTAL KNEE ARTHROPLASTY;  Surgeon: Durene Romans, MD;  Location: [facility] ORS;  Service: Orthopedics;  Laterality: Left;   TOTAL KNEE ARTHROPLASTY Right 06/18/2017   Procedure: RIGHT TOTAL KNEE ARTHROPLASTY;  Surgeon: Durene Romans, MD;  Location: [facility] ORS;  Service: Orthopedics;  Laterality: Right;   TRANSCATHETER AORTIC VALVE REPLACEMENT, TRANSFEMORAL N/A 09/03/2018   Procedure: TRANSCATHETER AORTIC VALVE REPLACEMENT, TRANSFEMORAL;  Surgeon: Kathleene Hazel, MD;  Location: [facility] OR;  Service: Open Heart Surgery;  Laterality: N/A;   Social History:  Social History   Socioeconomic History   Marital status: Widowed    Spouse name: Not on file   Number of children: 4   Years of education: Not on file   Highest education level:  Master's degree (e.g., MA, MS, MEng, MEd, MSW, MBA)  Occupational History   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  Tobacco Use   Smoking status: Never   Smokeless tobacco: Never  Vaping Use   Vaping Use: Never used  Substance and Sexual Activity   Alcohol use: No   Drug use: No   Sexual activity: Not Currently  Other Topics Concern   Not on file  Social History Narrative   Not on file   Social Determinants of Health   Financial Resource Strain: Low Risk  (11/12/2018)   Overall Financial Resource Strain (CARDIA)    Difficulty of Paying Living Expenses: Not very hard  Food Insecurity: No Food Insecurity (11/12/2018)   Hunger Vital Sign    Worried About Running Out of Food in the Last Year: Never true    Ran Out of Food in the Last Year: Never true  Transportation Needs: No Transportation Needs (11/12/2018)   PRAPARE - Administrator, Civil Service (Medical): No    Lack of Transportation (Non-Medical): No  Physical Activity: Inactive (11/12/2018)   Exercise Vital Sign    Days of Exercise per Week: 0 days    Minutes of Exercise per Session: 0 min  Stress: Stress Concern Present (11/12/2018)   [employer] of Occupational Health - Occupational Stress Questionnaire    Feeling of Stress : To some extent  Social Connections: Not on file  Intimate Partner Violence: Not on file   Family History:  Family History  Problem Relation Age of Onset   Diabetes Mother    Stroke Mother        Carotid artery disease   Heart disease Father        CAD   Coronary artery disease Father    Diabetes Sister     Review of Systems: Constitutional: Doesn't report fevers, chills or abnormal weight loss Eyes: Doesn't report blurriness of vision Ears, nose, mouth, throat, and face: Doesn't report sore throat Respiratory: Doesn't report cough, dyspnea or wheezes Cardiovascular: Doesn't report palpitation, chest discomfort  Gastrointestinal:  Doesn't  report nausea, constipation, diarrhea GU: Doesn't report incontinence Skin: Doesn't report skin rashes Neurological: Per HPI Musculoskeletal: Doesn't report joint pain Behavioral/Psych: Doesn't report anxiety  Physical  Exam: Wt Readings from Last 3 Encounters:  03/09/23 153 lb 12.8 oz (69.8 kg)  03/02/23 152 lb (68.9 kg)  03/01/23 152 lb 3.2 oz (69 kg)   Temp Readings from Last 3 Encounters:  03/09/23 98.3 F (36.8 C) (Oral)  03/02/23 98.6 F (37 C) (Temporal)  02/23/23 97.8 F (36.6 C) (Oral)   BP Readings from Last 3 Encounters:  03/09/23 (!) 114/56  03/02/23 (!) 94/58  03/02/23 123/71   Pulse Readings from Last 3 Encounters:  03/09/23 79  03/02/23 65  03/02/23 79   KPS: 80. General: Alert, cooperative, pleasant, in no acute distress Head: Normal EENT: No conjunctival injection or scleral icterus.  Lungs: Resp effort normal Cardiac: Regular rate Abdomen: Non-distended abdomen Skin: No rashes cyanosis or petechiae. Extremities: No clubbing or edema  Neurologic Exam: Mental Status: Awake, alert, attentive to examiner. Oriented to self and environment. Language is fluent with intact comprehension.  Cranial Nerves: Visual acuity is grossly normal. Visual fields are full. Extra-ocular movements intact. No ptosis. Face is symmetric Motor: Tone and bulk are normal. Power is full in both arms and legs. Reflexes are symmetric, no pathologic reflexes present.  Sensory: Intact to light touch Gait: Cane assisted   Labs: I have reviewed the data as listed    Component Value Date/Time   NA 132 (L) 03/09/2023 0933   NA 130 (L) 06/19/2022 1012   K 4.0 03/09/2023 0933   CL 98 03/09/2023 0933   CO2 26 03/09/2023 0933   GLUCOSE 120 (H) 03/09/2023 0933   BUN 18 03/09/2023 0933   BUN 21 06/19/2022 1012   CREATININE 0.78 03/09/2023 0933   CALCIUM 8.3 (L) 03/09/2023 0933   PROT 5.9 (L) 03/09/2023 0933   PROT 9.2 (H) 07/16/2019 0941   ALBUMIN 3.5 03/09/2023 0933   ALBUMIN  2.9 (L) 07/16/2019 0941   AST 10 (L) 03/09/2023 0933   ALT 16 03/09/2023 0933   ALKPHOS 74 03/09/2023 0933   BILITOT 1.0 03/09/2023 0933   GFRNONAA >60 03/09/2023 0933   GFRAA 78 07/16/2019 0941   Lab Results  Component Value Date   WBC 4.2 03/09/2023   NEUTROABS 3.1 03/09/2023   HGB 13.4 03/09/2023   HCT 39.8 03/09/2023   MCV 87.5 03/09/2023   PLT 126 (L) 03/09/2023     Assessment/Plan Chemotherapy-induced neuropathy (HCC)  Zoe [name] presents with clinical syndrome consistent with symmetric, length dependent, small and large fiber peripheral neuropathy.  Etiology is exposure to velcade chemotherapy.  There is likely an underlying radiculopathy component as well given her history and prior spine imaging.  In addition, today she presents with two discrete episodes possibly c/w TIA or stroke.  The second episode, more notably, occurred while on Eliquis.  We recommended obtaining a contrast enhanced MRI brain for further characterization, in particular given concurrent cancer.  Ok with continuing current dose of gabapentin, 600/900.    We appreciate the opportunity to participate in the care of Zoe [name].  We will call her after this MRI is complete and reviewed.  All questions were answered. The patient knows to call the clinic with any problems, questions or concerns. No barriers to learning were detected.  The total time spent in the encounter was 40 minutes and more than 50% was on counseling and review of test results   Henreitta Leber, MD Medical Director of Neuro-Oncology [facility] at [facility] 03/12/23 12:24 PM

## 2023-03-13 ENCOUNTER — Other Ambulatory Visit: Payer: Medicare Other

## 2023-03-13 ENCOUNTER — Ambulatory Visit: Payer: Medicare Other

## 2023-03-13 ENCOUNTER — Ambulatory Visit (HOSPITAL_COMMUNITY)
Admission: RE | Admit: 2023-03-13 | Discharge: 2023-03-13 | Disposition: A | Discharge status: Home / Self Care | Payer: Medicare Other | Source: Ambulatory Visit | Attending: Internal Medicine | Admitting: Internal Medicine

## 2023-03-13 ENCOUNTER — Telehealth: Payer: Self-pay | Admitting: Internal Medicine

## 2023-03-13 DIAGNOSIS — C9000 Multiple myeloma not having achieved remission: Secondary | ICD-10-CM | POA: Insufficient documentation

## 2023-03-13 DIAGNOSIS — I639 Cerebral infarction, unspecified: Secondary | ICD-10-CM | POA: Diagnosis not present

## 2023-03-13 DIAGNOSIS — R2990 Unspecified symptoms and signs involving the nervous system: Secondary | ICD-10-CM | POA: Diagnosis not present

## 2023-03-13 MED ORDER — GADOBUTROL 1 MMOL/ML IV SOLN
7.0000 mL | Freq: Once | INTRAVENOUS | Status: AC | PRN
  Administered 2023-03-13: 7 mL via INTRAVENOUS

## 2023-03-13 NOTE — Telephone Encounter (Signed)
Scheduled per 05/20 los, patient has been called and notified. 

## 2023-03-15 ENCOUNTER — Ambulatory Visit: Payer: Medicare Other | Admitting: Cardiology

## 2023-03-16 ENCOUNTER — Inpatient Hospital Stay: Payer: Medicare Other

## 2023-03-16 ENCOUNTER — Other Ambulatory Visit: Payer: Self-pay | Admitting: *Deleted

## 2023-03-16 ENCOUNTER — Other Ambulatory Visit: Payer: Self-pay

## 2023-03-16 VITALS — BP 154/76 | HR 70 | Temp 97.7°F | Resp 16 | Wt 147.0 lb

## 2023-03-16 DIAGNOSIS — C9000 Multiple myeloma not having achieved remission: Secondary | ICD-10-CM | POA: Diagnosis not present

## 2023-03-16 DIAGNOSIS — R21 Rash and other nonspecific skin eruption: Secondary | ICD-10-CM | POA: Diagnosis not present

## 2023-03-16 DIAGNOSIS — E538 Deficiency of other specified B group vitamins: Secondary | ICD-10-CM | POA: Diagnosis not present

## 2023-03-16 DIAGNOSIS — R059 Cough, unspecified: Secondary | ICD-10-CM | POA: Diagnosis not present

## 2023-03-16 DIAGNOSIS — Z7901 Long term (current) use of anticoagulants: Secondary | ICD-10-CM | POA: Diagnosis not present

## 2023-03-16 DIAGNOSIS — D649 Anemia, unspecified: Secondary | ICD-10-CM | POA: Diagnosis not present

## 2023-03-16 DIAGNOSIS — G620 Drug-induced polyneuropathy: Secondary | ICD-10-CM | POA: Diagnosis not present

## 2023-03-16 DIAGNOSIS — R251 Tremor, unspecified: Secondary | ICD-10-CM | POA: Diagnosis not present

## 2023-03-16 DIAGNOSIS — I5030 Unspecified diastolic (congestive) heart failure: Secondary | ICD-10-CM | POA: Diagnosis not present

## 2023-03-16 DIAGNOSIS — I4891 Unspecified atrial fibrillation: Secondary | ICD-10-CM | POA: Diagnosis not present

## 2023-03-16 DIAGNOSIS — I959 Hypotension, unspecified: Secondary | ICD-10-CM | POA: Diagnosis not present

## 2023-03-16 DIAGNOSIS — Z5112 Encounter for antineoplastic immunotherapy: Secondary | ICD-10-CM | POA: Diagnosis not present

## 2023-03-16 LAB — CMP (CANCER CENTER ONLY)
ALT: 10 U/L (ref 0–44)
AST: 11 U/L — ABNORMAL LOW (ref 15–41)
Albumin: 3.6 g/dL (ref 3.5–5.0)
Alkaline Phosphatase: 78 U/L (ref 38–126)
Anion gap: 9 (ref 5–15)
BUN: 15 mg/dL (ref 8–23)
CO2: 25 mmol/L (ref 22–32)
Calcium: 8.3 mg/dL — ABNORMAL LOW (ref 8.9–10.3)
Chloride: 98 mmol/L (ref 98–111)
Creatinine: 0.86 mg/dL (ref 0.44–1.00)
GFR, Estimated: >60 mL/min (ref >60)
Glucose, Bld: 139 mg/dL — ABNORMAL HIGH (ref 70–99)
Potassium: 4.6 mmol/L (ref 3.5–5.1)
Sodium: 132 mmol/L — ABNORMAL LOW (ref 135–145)
Total Bilirubin: 0.5 mg/dL (ref 0.3–1.2)
Total Protein: 5.9 g/dL — ABNORMAL LOW (ref 6.5–8.1)

## 2023-03-16 LAB — CBC WITH DIFFERENTIAL (CANCER CENTER ONLY)
Abs Immature Granulocytes: 0 10*3/uL (ref 0.00–0.07)
Basophils Absolute: 0 10*3/uL (ref 0.0–0.1)
Basophils Relative: 1 %
Eosinophils Absolute: 0.1 10*3/uL (ref 0.0–0.5)
Eosinophils Relative: 2 %
HCT: 41.6 % (ref 36.0–46.0)
Hemoglobin: 13.8 g/dL (ref 12.0–15.0)
Immature Granulocytes: 0 %
Lymphocytes Relative: 5 %
Lymphs Abs: 0.2 10*3/uL — ABNORMAL LOW (ref 0.7–4.0)
MCH: 28.8 pg (ref 26.0–34.0)
MCHC: 33.2 g/dL (ref 30.0–36.0)
MCV: 86.8 fL (ref 80.0–100.0)
Monocytes Absolute: 0.5 10*3/uL (ref 0.1–1.0)
Monocytes Relative: 14 %
Neutro Abs: 2.6 10*3/uL (ref 1.7–7.7)
Neutrophils Relative %: 78 %
Platelet Count: 162 10*3/uL (ref 150–400)
RBC: 4.79 MIL/uL (ref 3.87–5.11)
RDW: 16.9 % — ABNORMAL HIGH (ref 11.5–15.5)
WBC Count: 3.3 10*3/uL — ABNORMAL LOW (ref 4.0–10.5)
nRBC: 0 % (ref 0.0–0.2)

## 2023-03-16 LAB — LACTATE DEHYDROGENASE: LDH: 220 U/L — ABNORMAL HIGH (ref 98–192)

## 2023-03-16 MED ORDER — DARATUMUMAB-HYALURONIDASE-FIHJ 1800-30000 MG-UT/15ML ~~LOC~~ SOLN
1800.0000 mg | Freq: Once | SUBCUTANEOUS | Status: AC
  Administered 2023-03-16: 1800 mg via SUBCUTANEOUS
  Filled 2023-03-16: qty 15

## 2023-03-16 MED ORDER — DEXAMETHASONE 4 MG PO TABS
20.0000 mg | ORAL_TABLET | Freq: Once | ORAL | Status: DC
Start: 2023-03-16 — End: 2023-03-16

## 2023-03-16 MED ORDER — ACYCLOVIR 400 MG PO TABS: 400.0000 mg | ORAL_TABLET | Freq: Two times a day (BID) | ORAL | 3 refills | Status: DC

## 2023-03-16 MED ORDER — ACETAMINOPHEN 325 MG PO TABS
650.0000 mg | ORAL_TABLET | Freq: Once | ORAL | Status: AC
  Administered 2023-03-16: 650 mg via ORAL
  Filled 2023-03-16: qty 2

## 2023-03-16 MED ORDER — DIPHENHYDRAMINE HCL 25 MG PO CAPS
50.0000 mg | ORAL_CAPSULE | Freq: Once | ORAL | Status: AC
  Administered 2023-03-16: 50 mg via ORAL
  Filled 2023-03-16: qty 2

## 2023-03-16 NOTE — Patient Instructions (Signed)
[facility]  Discharge Instructions: Thank you for choosing [facility] to provide your oncology and hematology care.   If you have a lab appointment with the Cancer Center, please go directly to the Cancer Center and check in at the registration area.   Wear comfortable clothing and clothing appropriate for easy access to any Portacath or PICC line.   We strive to give you quality time with your provider. You may need to reschedule your appointment if you arrive late (15 or more minutes).  Arriving late affects you and other patients whose appointments are after yours.  Also, if you miss three or more appointments without notifying the office, you may be dismissed from the clinic at the provider's discretion.      For prescription refill requests, have your pharmacy contact our office and allow 72 hours for refills to be completed.    Today you received the following chemotherapy and/or immunotherapy agents Darzalex Faspro      To help prevent nausea and vomiting after your treatment, we encourage you to take your nausea medication as directed.  BELOW ARE SYMPTOMS THAT SHOULD BE REPORTED IMMEDIATELY: *FEVER GREATER THAN 100.4 F (38 C) OR HIGHER *CHILLS OR SWEATING *NAUSEA AND VOMITING THAT IS NOT CONTROLLED WITH YOUR NAUSEA MEDICATION *UNUSUAL SHORTNESS OF BREATH *UNUSUAL BRUISING OR BLEEDING *URINARY PROBLEMS (pain or burning when urinating, or frequent urination) *BOWEL PROBLEMS (unusual diarrhea, constipation, pain near the anus) TENDERNESS IN MOUTH AND THROAT WITH OR WITHOUT PRESENCE OF ULCERS (sore throat, sores in mouth, or a toothache) UNUSUAL RASH, SWELLING OR PAIN  UNUSUAL VAGINAL DISCHARGE OR ITCHING   Items with * indicate a potential emergency and should be followed up as soon as possible or go to the Emergency Department if any problems should occur.  Please show the CHEMOTHERAPY ALERT CARD or IMMUNOTHERAPY ALERT CARD at  check-in to the Emergency Department and triage nurse.  Should you have questions after your visit or need to cancel or reschedule your appointment, please contact [facility]  Dept: [phone]  and follow the prompts.  Office hours are 8:00 a.m. to 4:30 p.m. Monday - Friday. Please note that voicemails left after 4:00 p.m. may not be returned until the following business day.  We are closed weekends and major holidays. You have access to a nurse at all times for urgent questions. Please call the main number to the clinic Dept: [phone] and follow the prompts.   For any non-urgent questions, you may also contact your provider using MyChart. We now offer e-Visits for anyone 18 and older to request care online for non-urgent symptoms. For details visit [email or web address].   Also download the MyChart app! Go to the app store, search "MyChart", open the app, select , and log in with your MyChart username and password.  

## 2023-03-16 NOTE — Progress Notes (Signed)
Pt states she took decadron prior to appointment time

## 2023-03-18 ENCOUNTER — Encounter: Payer: Self-pay | Admitting: Hematology and Oncology

## 2023-03-20 ENCOUNTER — Ambulatory Visit: Payer: Medicare Other

## 2023-03-20 ENCOUNTER — Other Ambulatory Visit: Payer: Medicare Other

## 2023-03-20 ENCOUNTER — Other Ambulatory Visit: Payer: Self-pay | Admitting: *Deleted

## 2023-03-20 ENCOUNTER — Encounter: Payer: Self-pay | Admitting: Cardiology

## 2023-03-20 ENCOUNTER — Inpatient Hospital Stay (HOSPITAL_BASED_OUTPATIENT_CLINIC_OR_DEPARTMENT_OTHER): Payer: Medicare Other | Admitting: Internal Medicine

## 2023-03-20 ENCOUNTER — Ambulatory Visit: Payer: Medicare Other | Admitting: Physician Assistant

## 2023-03-20 DIAGNOSIS — C9000 Multiple myeloma not having achieved remission: Secondary | ICD-10-CM | POA: Diagnosis not present

## 2023-03-20 DIAGNOSIS — T451X5A Adverse effect of antineoplastic and immunosuppressive drugs, initial encounter: Secondary | ICD-10-CM | POA: Diagnosis not present

## 2023-03-20 DIAGNOSIS — D472 Monoclonal gammopathy: Secondary | ICD-10-CM

## 2023-03-20 DIAGNOSIS — I6310 Cerebral infarction due to embolism of unspecified precerebral artery: Secondary | ICD-10-CM | POA: Diagnosis not present

## 2023-03-20 DIAGNOSIS — G620 Drug-induced polyneuropathy: Secondary | ICD-10-CM

## 2023-03-20 DIAGNOSIS — I639 Cerebral infarction, unspecified: Secondary | ICD-10-CM | POA: Insufficient documentation

## 2023-03-20 MED ORDER — ASPIRIN 81 MG PO TBEC: 81.0000 mg | DELAYED_RELEASE_TABLET | Freq: Every day | ORAL | 2 refills | Status: DC

## 2023-03-20 NOTE — Progress Notes (Signed)
I connected with [name] on 03/20/23 at  2:30 PM EDT by telephone visit and verified that I am speaking with the correct person using two identifiers.  I discussed the limitations, risks, security and privacy concerns of performing an evaluation and management service by telemedicine and the availability of in-person appointments. I also discussed with the patient that there may be a patient responsible charge related to this service. The patient expressed understanding and agreed to proceed.   Other persons participating in the visit and their role in the encounter:  n/a   Patient's location:  Home Provider's location:  Office Chief Complaint:  Chemotherapy-induced neuropathy (HCC)  Cerebrovascular accident (CVA) due to embolism of precerebral artery (HCC)  History of Present Ilness: [name] H[place] reports no clinical changes today.  No new or progressive deficits, no recurrence of episodes of impaired cognition and speech.  Observations: Language and cognition at baseline   Imaging:  [facility] Clinician Interpretation: I have personally reviewed the CNS images as listed.  My interpretation, in the context of the patient's clinical presentation, is multiple subacute infarcts, both hemispheres.  Pending official read  No results found.  Assessment and Plan: Chemotherapy-induced neuropathy (HCC)  Cerebrovascular accident (CVA) due to embolism of precerebral artery (HCC)  [name] presents with clinical radiographic syndrome consistent with multifocal infarcts, refractory to oral anticoagulation (Eliquis).  Presumed etiology had been atrial fibrillation, but vascular disease, atheromatous changes, are also possibly implicated concurrently.  Fortunately neither foci of DWI positivity is associated with any symptom.  There remains a possibility for occult metastasis, though stroke appears far more likely based on the imaging pattern and history.  We discussed resuming  aspirin 81mg  daily given concern for ongoing infarcts, "failure" of Eliquis.  She understands there will be increased risk of bleeding, GI bleed, but this is outweighed currently by risk for disabling stroke.  We will recommend repeat MRI brain study with dedicated MRA of head and neck.  Will check lipid panel, HgbA1c, B12 in serum upcoming.    No need to repeat recent TTE.  She is agreeable with these interventions and workup.  Follow Up Instructions: RTC following above testing or sooner if needed.  I discussed the assessment and treatment plan with the patient.  The patient was provided an opportunity to ask questions and all were answered.  The patient agreed with the plan and demonstrated understanding of the instructions.    The patient was advised to call back or seek an in-person evaluation if the symptoms worsen or if the condition fails to improve as anticipated.    Henreitta Leber, MD   I provided 23 minutes of non face-to-face telephone visit time during this encounter, and > 50% was spent counseling as documented under my assessment & plan.

## 2023-03-21 ENCOUNTER — Other Ambulatory Visit: Payer: Self-pay | Admitting: *Deleted

## 2023-03-21 DIAGNOSIS — C9000 Multiple myeloma not having achieved remission: Secondary | ICD-10-CM

## 2023-03-21 MED ORDER — LENALIDOMIDE 25 MG PO CAPS
25.0000 mg | ORAL_CAPSULE | Freq: Every day | ORAL | 0 refills | Status: DC
Start: 2023-03-21 — End: 2023-04-13

## 2023-03-22 ENCOUNTER — Other Ambulatory Visit: Payer: Self-pay | Admitting: Cardiology

## 2023-03-22 DIAGNOSIS — I5033 Acute on chronic diastolic (congestive) heart failure: Secondary | ICD-10-CM

## 2023-03-22 DIAGNOSIS — R600 Localized edema: Secondary | ICD-10-CM

## 2023-03-23 ENCOUNTER — Inpatient Hospital Stay: Payer: Medicare Other | Admitting: Physician Assistant

## 2023-03-23 ENCOUNTER — Ambulatory Visit (HOSPITAL_COMMUNITY)
Admission: RE | Admit: 2023-03-23 | Discharge: 2023-03-23 | Disposition: A | Discharge status: Home / Self Care | Payer: Medicare Other | Source: Ambulatory Visit | Attending: Physician Assistant | Admitting: Physician Assistant

## 2023-03-23 ENCOUNTER — Telehealth: Payer: Self-pay | Admitting: *Deleted

## 2023-03-23 ENCOUNTER — Inpatient Hospital Stay (HOSPITAL_BASED_OUTPATIENT_CLINIC_OR_DEPARTMENT_OTHER): Payer: Medicare Other

## 2023-03-23 ENCOUNTER — Other Ambulatory Visit: Payer: Self-pay

## 2023-03-23 ENCOUNTER — Encounter: Payer: Self-pay | Admitting: Hematology and Oncology

## 2023-03-23 ENCOUNTER — Inpatient Hospital Stay: Payer: Medicare Other

## 2023-03-23 VITALS — BP 128/59 | HR 67 | Temp 97.7°F | Resp 16 | Ht 63.0 in | Wt 155.5 lb

## 2023-03-23 VITALS — BP 138/54 | HR 64 | Temp 98.1°F | Resp 16

## 2023-03-23 DIAGNOSIS — C9000 Multiple myeloma not having achieved remission: Secondary | ICD-10-CM

## 2023-03-23 DIAGNOSIS — R052 Subacute cough: Secondary | ICD-10-CM | POA: Insufficient documentation

## 2023-03-23 DIAGNOSIS — R059 Cough, unspecified: Secondary | ICD-10-CM | POA: Diagnosis not present

## 2023-03-23 LAB — CMP (CANCER CENTER ONLY)
ALT: 8 U/L (ref 0–44)
AST: 10 U/L — ABNORMAL LOW (ref 15–41)
Albumin: 3.4 g/dL — ABNORMAL LOW (ref 3.5–5.0)
Alkaline Phosphatase: 74 U/L (ref 38–126)
Anion gap: 9 (ref 5–15)
BUN: 17 mg/dL (ref 8–23)
CO2: 28 mmol/L (ref 22–32)
Calcium: 8.4 mg/dL — ABNORMAL LOW (ref 8.9–10.3)
Chloride: 96 mmol/L — ABNORMAL LOW (ref 98–111)
Creatinine: 0.85 mg/dL (ref 0.44–1.00)
GFR, Estimated: >60 mL/min (ref >60)
Glucose, Bld: 117 mg/dL — ABNORMAL HIGH (ref 70–99)
Potassium: 4.5 mmol/L (ref 3.5–5.1)
Sodium: 133 mmol/L — ABNORMAL LOW (ref 135–145)
Total Bilirubin: 0.8 mg/dL (ref 0.3–1.2)
Total Protein: 5.8 g/dL — ABNORMAL LOW (ref 6.5–8.1)

## 2023-03-23 LAB — CBC WITH DIFFERENTIAL (CANCER CENTER ONLY)
Abs Immature Granulocytes: 0.02 10*3/uL (ref 0.00–0.07)
Basophils Absolute: 0 10*3/uL (ref 0.0–0.1)
Basophils Relative: 1 %
Eosinophils Absolute: 0.2 10*3/uL (ref 0.0–0.5)
Eosinophils Relative: 3 %
HCT: 39.9 % (ref 36.0–46.0)
Hemoglobin: 13.3 g/dL (ref 12.0–15.0)
Immature Granulocytes: 0 %
Lymphocytes Relative: 4 %
Lymphs Abs: 0.2 10*3/uL — ABNORMAL LOW (ref 0.7–4.0)
MCH: 28.9 pg (ref 26.0–34.0)
MCHC: 33.3 g/dL (ref 30.0–36.0)
MCV: 86.6 fL (ref 80.0–100.0)
Monocytes Absolute: 0.5 10*3/uL (ref 0.1–1.0)
Monocytes Relative: 9 %
Neutro Abs: 4.6 10*3/uL (ref 1.7–7.7)
Neutrophils Relative %: 83 %
Platelet Count: 152 10*3/uL (ref 150–400)
RBC: 4.61 MIL/uL (ref 3.87–5.11)
RDW: 16.6 % — ABNORMAL HIGH (ref 11.5–15.5)
WBC Count: 5.6 10*3/uL (ref 4.0–10.5)
nRBC: 0 % (ref 0.0–0.2)

## 2023-03-23 LAB — LACTATE DEHYDROGENASE: LDH: 196 U/L — ABNORMAL HIGH (ref 98–192)

## 2023-03-23 MED ORDER — BENZONATATE 100 MG PO CAPS: 100.0000 mg | ORAL_CAPSULE | Freq: Three times a day (TID) | ORAL | 0 refills | Status: DC | PRN

## 2023-03-23 MED ORDER — DIPHENHYDRAMINE HCL 25 MG PO CAPS
50.0000 mg | ORAL_CAPSULE | Freq: Once | ORAL | Status: AC
  Administered 2023-03-23: 50 mg via ORAL
  Filled 2023-03-23: qty 2

## 2023-03-23 MED ORDER — ACETAMINOPHEN 325 MG PO TABS
650.0000 mg | ORAL_TABLET | Freq: Once | ORAL | Status: AC
  Administered 2023-03-23: 650 mg via ORAL
  Filled 2023-03-23: qty 2

## 2023-03-23 MED ORDER — DARATUMUMAB-HYALURONIDASE-FIHJ 1800-30000 MG-UT/15ML ~~LOC~~ SOLN
1800.0000 mg | Freq: Once | SUBCUTANEOUS | Status: AC
  Administered 2023-03-23: 1800 mg via SUBCUTANEOUS
  Filled 2023-03-23: qty 15

## 2023-03-23 NOTE — Telephone Encounter (Signed)
Received call from patient just before 5pm. She is calling to see if her CXR results are back yet. Advised that There have not been posted yet. Advised that I would send [name], PA a message to review on Monday, 03/26/23. Also advised that if she feels worse she should call us over the weekend and speak with on call provider. She voiced understanding.

## 2023-03-23 NOTE — Patient Instructions (Signed)
[facility]  Discharge Instructions: Thank you for choosing [facility] to provide your oncology and hematology care.   If you have a lab appointment with the Cancer Center, please go directly to the Cancer Center and check in at the registration area.   Wear comfortable clothing and clothing appropriate for easy access to any Portacath or PICC line.   We strive to give you quality time with your provider. You may need to reschedule your appointment if you arrive late (15 or more minutes).  Arriving late affects you and other patients whose appointments are after yours.  Also, if you miss three or more appointments without notifying the office, you may be dismissed from the clinic at the provider's discretion.      For prescription refill requests, have your pharmacy contact our office and allow 72 hours for refills to be completed.    Today you received the following chemotherapy and/or immunotherapy agents Darzalex Faspro      To help prevent nausea and vomiting after your treatment, we encourage you to take your nausea medication as directed.  BELOW ARE SYMPTOMS THAT SHOULD BE REPORTED IMMEDIATELY: *FEVER GREATER THAN 100.4 F (38 C) OR HIGHER *CHILLS OR SWEATING *NAUSEA AND VOMITING THAT IS NOT CONTROLLED WITH YOUR NAUSEA MEDICATION *UNUSUAL SHORTNESS OF BREATH *UNUSUAL BRUISING OR BLEEDING *URINARY PROBLEMS (pain or burning when urinating, or frequent urination) *BOWEL PROBLEMS (unusual diarrhea, constipation, pain near the anus) TENDERNESS IN MOUTH AND THROAT WITH OR WITHOUT PRESENCE OF ULCERS (sore throat, sores in mouth, or a toothache) UNUSUAL RASH, SWELLING OR PAIN  UNUSUAL VAGINAL DISCHARGE OR ITCHING   Items with * indicate a potential emergency and should be followed up as soon as possible or go to the Emergency Department if any problems should occur.  Please show the CHEMOTHERAPY ALERT CARD or IMMUNOTHERAPY ALERT CARD at  check-in to the Emergency Department and triage nurse.  Should you have questions after your visit or need to cancel or reschedule your appointment, please contact [facility]  Dept: [phone]  and follow the prompts.  Office hours are 8:00 a.m. to 4:30 p.m. Monday - Friday. Please note that voicemails left after 4:00 p.m. may not be returned until the following business day.  We are closed weekends and major holidays. You have access to a nurse at all times for urgent questions. Please call the main number to the clinic Dept: [phone] and follow the prompts.   For any non-urgent questions, you may also contact your provider using MyChart. We now offer e-Visits for anyone 18 and older to request care online for non-urgent symptoms. For details visit [email or web address].   Also download the MyChart app! Go to the app store, search "MyChart", open the app, select [facility], and log in with your MyChart username and password.  

## 2023-03-23 NOTE — Progress Notes (Signed)
[facility] [phone]   [phone]   PROGRESS NOTE   Patient Care Team: [facility], [facility] as PCP - General (Rheumatology)   Hematological/Oncological History # IgG Lambda Multiple Myeloma  06/14/2022: establish care with [name] due to anemia. Labs showed M protein 4.9, Kappa 7.2, Lambda 10.8, ratio 0.67 07/13/2022: Bmbx showed Lambda restricted plasma cell neoplasm involving approximately 60% of the cellular marrow by IHC on the biopsy.  08/01/2022: Cycle 1 Day 1 of VRd chemotherapy. (Holding revlimid initially) 08/21/2022:  Cycle 2 Day 1 of VRd chemotherapy. (Holding revlimid) 09/04/2022: Cycle 3 Day 1 of VRd chemotherapy. Started revlimid 10/03/2022: Cycle 4 Day 1 of VRd chemotherapy 10/31/2022: Cycle 5 Day 1 of VRd chemotherapy 11/20/2022: Cycle 6 Day 1 of VRd chemotherapy 12/11/2022: Cycle 7 Day 1 of VRd chemotherapy 01/02/2023: Cycle 8 Day 1 of VRd chemotherapy 01/22/2023: Cycle 9 Day 1 of VRd chemotherapy 02/23/2023: Day 1 Cycle 1 of Dara/Rev/Dex 03/23/2023: Day 1 Cycle 2 of Dara/Rev/Dex    INTERVAL HISTORY: [name] 89 y.o. female returns to the clinic today for a follow-up visit accompanied by her daughter-in-law. Her last visit was on 03/09/2023. In the interim, she continued on Dara/Rev/Dex.   Ms. Etris reports she is tolerating the Darzalex therapy well without any new symptoms.  She reports that she has a ongoing cough with nasal congestion that has not improved with mucinex. She reports mild shortness of breath with the cough but has not developed a fever. She reports her leg pain and neuropathy is stable from the last visit. She is compliant with taking gabapentin as prescribed. She denies nausea, vomiting or abdominal pain. Her bowel habits are unchanged without recurrent episodes of diarrhea or constipation. She has no other complaints. She is willing to proceed with treatment today.   MEDICAL HISTORY: Past  Medical History:  Diagnosis Date   Arthritis    some - per patient   Breast cancer (HCC)    breast cancer / left    Cataract    bilat    GERD (gastroesophageal reflux disease)    History of kidney stones    Hyperlipidemia    Hypertension    Hypothyroidism    Macular degeneration    Left   S/P TAVR (transcatheter aortic valve replacement) 09/03/2018   23 mm Edwards Sapien 3 transcatheter heart valve placed via percutaneous right transfemoral approach    Severe aortic stenosis    Stress incontinence    Thyroid disease    Tinnitus     ALLERGIES:  is allergic to penicillins and sulfa antibiotics.  MEDICATIONS:  Current Outpatient Medications  Medication Sig Dispense Refill   acyclovir (ZOVIRAX) 400 MG tablet Take 1 tablet (400 mg total) by mouth 2 (two) times daily. 60 tablet 3   amiodarone (PACERONE) 200 MG tablet Take 1 tablet (200 mg total) by mouth daily. One tab twice daily for 10 days then one tab daily 40 tablet 2   apixaban (ELIQUIS) 5 MG TABS tablet Take 1 tablet (5 mg total) by mouth 2 (two) times daily. 180 tablet 1   Artificial Tear Solution (SOOTHE XP) SOLN Place 1 drop into both eyes every evening.     aspirin EC 81 MG tablet Take 1 tablet (81 mg total) by mouth daily. Swallow whole. 60 tablet 2   Cholecalciferol (VITAMIN D3) 50 MCG (2000 UT) capsule Take 1 capsule (2,000 Units total) by mouth daily.     [employer] VITAMIN B12 1000 MCG tablet TAKE  1 TABLET BY MOUTH EVERY DAY 90 tablet 1   dapagliflozin propanediol (FARXIGA) 10 MG TABS tablet TAKE 1 TABLET BY MOUTH EVERY DAY 90 tablet 2   dexamethasone (DECADRON) 4 MG tablet Take 5 tablets (20 mg total) by mouth once a week. Take 20 mg (5 tablets) on day of multiple myeloma treatment 20 tablet 5   esomeprazole (NEXIUM) 20 MG capsule Take 20 mg by mouth daily as needed (Heartburn).     furosemide (LASIX) 40 MG tablet Take 40 mg by mouth daily as needed for fluid.     gabapentin (NEURONTIN) 300 MG capsule Take 3 capsules (900  mg total) by mouth 2 (two) times daily. (Patient taking differently: Take 1-2 capsules by mouth 2 (two) times daily. 200mg  in the morning and 300mg  in the evening) 180 capsule 1   guaiFENesin (MUCINEX) 600 MG 12 hr tablet Take 600 mg by mouth 2 (two) times daily.     hydrocortisone cream 1 % Apply 1 Application topically 2 (two) times daily. 120 g 0   lenalidomide (REVLIMID) 25 MG capsule Take 1 capsule (25 mg total) by mouth daily. Take for 14 days,then none for 7 days.Repeat every 21 days. Auth # [id number]  Date obtained 03/21/23 14 capsule 0   levothyroxine (SYNTHROID, LEVOTHROID) 100 MCG tablet Take 100 mcg by mouth daily before breakfast.  2   metoprolol succinate (TOPROL-XL) 25 MG 24 hr tablet Take 1 tablet (25 mg total) by mouth daily. 30 tablet 2   OVER THE COUNTER MEDICATION Take 1 tablet by mouth daily. AREDS     polyethylene glycol (MIRALAX / GLYCOLAX) 17 g packet Take 17 g by mouth daily as needed for mild constipation.     potassium chloride (KLOR-CON M10) 10 MEQ tablet TAKE 2 TABLETS BY MOUTH 2 TIMES DAILY. 360 tablet 3   pravastatin (PRAVACHOL) 40 MG tablet Take 40 mg by mouth every evening.     prochlorperazine (COMPAZINE) 10 MG tablet Take 1 tablet (10 mg total) by mouth every 6 (six) hours as needed for nausea or vomiting. 30 tablet 0   sennosides-docusate sodium (SENOKOT-S) 8.6-50 MG tablet Take 1 tablet by mouth daily.     triamcinolone ointment (KENALOG) 0.5 % Apply 1 Application topically 2 (two) times daily. 30 g 0   No current facility-administered medications for this visit.    SURGICAL HISTORY:  Past Surgical History:  Procedure Laterality Date   ABDOMINAL HYSTERECTOMY  1970's   BACK SURGERY     BREAST LUMPECTOMY  12/1998   lumpectomy   CARDIAC CATHETERIZATION     EYE SURGERY     cataract surgery bilat    INTRAOPERATIVE TRANSTHORACIC ECHOCARDIOGRAM N/A 09/03/2018   Procedure: INTRAOPERATIVE TRANSTHORACIC ECHOCARDIOGRAM;  Surgeon: Kathleene Hazel, MD;   Location: [facility] OR;  Service: Open Heart Surgery;  Laterality: N/A;   KYPHOPLASTY N/A 09/07/2022   Procedure: THORACIC EIGHT KYPHOPLASTY;  Surgeon: Venita Lick, MD;  Location: [facility] OR;  Service: Orthopedics;  Laterality: N/A;  1 hr Local with [facility] 3 C-Bed   LITHOTRIPSY     Right total knee     2018 Dr. Charlann Boxer   RIGHT/LEFT HEART CATH AND CORONARY ANGIOGRAPHY N/A 08/06/2018   Procedure: RIGHT/LEFT HEART CATH AND CORONARY ANGIOGRAPHY;  Surgeon: Yates Decamp, MD;  Location: [facility] INVASIVE CV LAB;  Service: Cardiovascular;  Laterality: N/A;   THYROIDECTOMY, PARTIAL  1975   TONSILLECTOMY     as a child - patient not sure of exact date   TOTAL KNEE ARTHROPLASTY  Left 03/13/2016   Procedure: TOTAL KNEE ARTHROPLASTY;  Surgeon: Durene Romans, MD;  Location: [facility] ORS;  Service: Orthopedics;  Laterality: Left;   TOTAL KNEE ARTHROPLASTY Right 06/18/2017   Procedure: RIGHT TOTAL KNEE ARTHROPLASTY;  Surgeon: Durene Romans, MD;  Location: [facility] ORS;  Service: Orthopedics;  Laterality: Right;   TRANSCATHETER AORTIC VALVE REPLACEMENT, TRANSFEMORAL N/A 09/03/2018   Procedure: TRANSCATHETER AORTIC VALVE REPLACEMENT, TRANSFEMORAL;  Surgeon: Kathleene Hazel, MD;  Location: [facility] OR;  Service: Open Heart Surgery;  Laterality: N/A;   REVIEW OF SYSTEMS:   Constitutional: ( - ) fevers, ( - )  chills , ( - ) night sweats Eyes: ( - ) blurriness of vision, ( - ) double vision, ( - ) watery eyes Ears, nose, mouth, throat, and face: ( - ) mucositis, ( - ) sore throat Respiratory: ( +) cough, ( - ) dyspnea, ( - ) wheezes Cardiovascular: ( - ) palpitation, ( - ) chest discomfort, ( +) lower extremity swelling Gastrointestinal:  ( - ) nausea, ( - ) heartburn, ( - ) change in bowel habits Skin: ( - ) abnormal skin rashes Lymphatics: ( - ) new lymphadenopathy, ( - ) easy bruising Neurological: ( - ) numbness, ( - ) tingling, ( - ) new weaknesses Behavioral/Psych: ( - ) mood change, ( - ) new changes  All other systems were  reviewed with the patient and are negative.   PHYSICAL EXAMINATION:  Blood pressure (!) 128/59, pulse 67, temperature 97.7 F (36.5 C), temperature source Temporal, resp. rate 16, height 5\' 3"  (1.6 m), weight 155 lb 8 oz (70.5 kg), SpO2 97 %.  ECOG PERFORMANCE STATUS: 1  GENERAL: well appearing elderly Caucasian female, alert, no distress and comfortable SKIN: skin color, texture, turgor are normal, no rashes or significant lesions EYES: conjunctiva are pink and non-injected, sclera clear LUNGS:normal breathing effort. Mild wheezing and crackles in lower lobes b/l.  HEART: irregular rhythm ,normal rate and no murmurs. +1 bilateral lower extremity edema starting below the knee to ankles.  Musculoskeletal: no cyanosis of digits and no clubbing  PSYCH: alert & oriented x 3, fluent speech NEURO: no focal motor/sensory deficits    LABORATORY DATA: Lab Results  Component Value Date   WBC 5.6 03/23/2023   HGB 13.3 03/23/2023   HCT 39.9 03/23/2023   MCV 86.6 03/23/2023   PLT 152 03/23/2023      Chemistry      Component Value Date/Time   NA 132 (L) 03/16/2023 1229   NA 130 (L) 06/19/2022 1012   K 4.6 03/16/2023 1229   CL 98 03/16/2023 1229   CO2 25 03/16/2023 1229   BUN 15 03/16/2023 1229   BUN 21 06/19/2022 1012   CREATININE 0.86 03/16/2023 1229      Component Value Date/Time   CALCIUM 8.3 (L) 03/16/2023 1229   ALKPHOS 78 03/16/2023 1229   AST 11 (L) 03/16/2023 1229   ALT 10 03/16/2023 1229   BILITOT 0.5 03/16/2023 1229       RADIOGRAPHIC STUDIES:  MR BRAIN W WO CONTRAST  Result Date: 03/21/2023 CLINICAL DATA:  Brain/CNS neoplasm. Assess treatment response. Stroke symptoms. EXAM: MRI HEAD WITHOUT AND WITH CONTRAST TECHNIQUE: Multiplanar, multiecho pulse sequences of the brain and surrounding structures were obtained without and with intravenous contrast. CONTRAST:  7mL GADAVIST GADOBUTROL 1 MMOL/ML IV SOLN COMPARISON:  Head CT 02/19/2021 FINDINGS: Brain: Diffusion  imaging does not show any acute or subacute infarction. There are symmetric foci of T2 shine through within the  deep white matter at the frontoparietal vertices. Mild chronic small-vessel ischemic change affects pons. Few old small vessel cerebellar infarctions on the right. Cerebral hemispheres show old small vessel infarctions within the deep and subcortical white matter. No evidence of primary or metastatic mass lesion, hemorrhage, hydrocephalus or extra-axial collection. After contrast administration, no abnormal enhancement occurs. Vascular: Flow Skull and upper cervical spine: Negative Sinuses/Orbits: Mucosal inflammatory changes of the paranasal sinuses with fluid levels in the maxillary sinuses consistent with rhinosinusitis. Orbits negative. Other: None IMPRESSION: 1. No acute brain finding. Chronic small-vessel ischemic changes of the pons, cerebral hemispheric white matter and right cerebellum. Symmetric foci of T2 shine through related to old white matter insults at the frontoparietal vertices do not represent true recent infarctions. 2. No evidence of intracranial neoplastic disease. 3. Mucosal inflammatory changes of the paranasal sinuses with fluid levels in the maxillary sinuses consistent with rhinosinusitis. Electronically Signed   By: Paulina Fusi M.D.   On: 03/21/2023 11:05     ASSESSMENT/PLAN:  AIREAL SCHEFFLER is a 89 y.o. female who presents to the clinic for evaluation for IgG lambda multiple myeloma.   # IgG Lambda Multiple Myeloma  --diagnosis of MM confirmed with bone marrow biopsy showing 60% plasma cells and anemia -- Bone survey shows no lytic lesions, kidney function is within normal limits.  --08/01/2022 was Cycle 1 Day 1 of VRd  -Dr. Leonides Schanz discontinued velcade on 02/05/23 due to rash. She is status post day 15 cycle #9.  -Dr. Leonides Schanz changed the care plan to Daratumumab, Revlimid, and decadron. She is scheduled for day 1 cycle #1 today  -Started Cycle 1, Day 1 of  Dara/Rev/Dex on 02/23/2023.  Plan:  --Due for Cycle 2, Day 1 of Dara/Rev/Dex today.  --Labs show white blood cell count 5.6, hemoglobin 13.3, MCV 86.6, and platelets of 152 --last M protein from 02/05/2023 trended down to 0.3 (4.9 prior to start of therapy). Normalized SFLC.  --RTC in 2 weeks with interval weekly treatment.   #Cough: --will obtain chest xray to further evaluate --currently on mucinex with no improvement --if no evidence of pneumonia, we can send tessalon perles 100 mg TID PRN.   # Rash--improved -- Rash is not painful or itchy. -- Prescribed triamcinolone ointment to see if this helps with her rash.   #Hypotension/BP/Atrial Fibrillation  --Follows with cardiology, Dr. Jacinto Halim -- Recommendations include metoprolol succinate 25 mg PO daily and restarted patient back on amiodarone 200 mg PO daily x 10 days followed by 12 mg daily.  --Continue on Eliquis 5 mg BID   #Bilateral lower extremity edema:  --Continue to wear compression stockings.    # Normocytic Anemia #Vitamin B12 Deficiency --Anemia likely driven by multiple myeloma, but may be component of vitamin B12 deficiency as well. --initial labs showed elevated MMA with B12 180 -- Continue vitamin B12 1000 mcg p.o. daily -- Continue to monitor   #Left leg/thigh neuropathy--improving: --Currently on gabapentin 900 mg nightly and 600 mg in the morning --Encouraged to try water aerobics and stretches.  --Following with Dr. Debbe Bales (ortho) to see if she is a candidate for steroid injections.  -- Patient evaluated by Dr. Barbaraann Cao thinks that this may be neuropathy worsened by her Velcade therapy.   #Episode of right UE tremor and speech abnormality: --Evaluated by Dr. Barbaraann Cao on 03/20/2023. Presumed etiology had been atrial fibrillation, but vascular disease, atheromatous changes, are also possibly implicated concurrently.  --MRI brain from 03/13/2023 show no acute finding including no evidence of intracranial  neoplastic  disease. There are chronic small-vessel ischemic changes of the pons, cerebral hemispheric white matter and right cerebellum. --Educated patient to seek immediate evaluate if there are repeat episodes. --No neurological deficits identified per exam today  #Upcoming dental procedures: --Encouraged patient to follow up with dentist as what type of clearance they require --Likely need a clearance letter from Dr. Jacinto Halim on if and when she can hold Eliquis.  --From myeloma perspective, okay to proceed with dental procedure but might need to hold revlimid temporarily based on how invasive the procedure will be.    #Supportive Care -- chemotherapy education complete.  -- port placement not required.  --Awaiting to start Zometa/Xgeva  -- zofran 8mg  q8H PRN and compazine 10mg  PO q6H for nausea -- acyclovir 400mg  PO BID for VCZ prophylaxis -- tylenol 1000 mg q8H PRN for back pain.     No orders of the defined types were placed in this encounter.   Patient expressed understanding of the plan provided.   I have spent a total of 30 minutes minutes of face-to-face and non-face-to-face time, preparing to see the patient, performing a medically appropriate examination, counseling and educating the patient, ordering tests/procedures, documenting clinical information in the electronic health record, and care coordination.   [name] PA-C Dept of Hematology and Oncology [facility] Phone: [phone]

## 2023-03-23 NOTE — Progress Notes (Signed)
Patient took her dexamethasone at about 0730 this morning.

## 2023-03-24 ENCOUNTER — Other Ambulatory Visit: Payer: Self-pay | Admitting: Hematology

## 2023-03-24 MED ORDER — BENZONATATE 100 MG PO CAPS: 100.0000 mg | ORAL_CAPSULE | Freq: Three times a day (TID) | ORAL | 0 refills | Status: DC | PRN

## 2023-03-26 ENCOUNTER — Telehealth: Payer: Self-pay | Admitting: *Deleted

## 2023-03-26 NOTE — Telephone Encounter (Signed)
TCT patient regarding her CXR from 03/23/23. Spoke with her and advised that her CXR did not show any pneumonia or bronchitis. Advised that [name], PA has sent in a prescription for Tessolon Pearles for her cough Pt voiced understanding. Denies fever /chills but that she still has a deep chest cough. Advised that we can see her when she comes for her treatment on Friday if the cough has not improved this week. Pt voiced understanding.

## 2023-03-27 ENCOUNTER — Ambulatory Visit: Payer: Medicare Other

## 2023-03-27 ENCOUNTER — Other Ambulatory Visit: Payer: Medicare Other

## 2023-03-28 ENCOUNTER — Encounter (INDEPENDENT_AMBULATORY_CARE_PROVIDER_SITE_OTHER): Payer: Medicare Other | Admitting: Ophthalmology

## 2023-03-28 DIAGNOSIS — H353231 Exudative age-related macular degeneration, bilateral, with active choroidal neovascularization: Secondary | ICD-10-CM

## 2023-03-28 DIAGNOSIS — H35372 Puckering of macula, left eye: Secondary | ICD-10-CM | POA: Diagnosis not present

## 2023-03-28 DIAGNOSIS — H35033 Hypertensive retinopathy, bilateral: Secondary | ICD-10-CM

## 2023-03-28 DIAGNOSIS — I10 Essential (primary) hypertension: Secondary | ICD-10-CM

## 2023-03-28 DIAGNOSIS — H43813 Vitreous degeneration, bilateral: Secondary | ICD-10-CM

## 2023-03-29 ENCOUNTER — Ambulatory Visit: Payer: Medicare Other | Admitting: Cardiology

## 2023-03-29 ENCOUNTER — Encounter: Payer: Self-pay | Admitting: Cardiology

## 2023-03-29 VITALS — BP 129/66 | HR 81 | Resp 16 | Ht 63.0 in | Wt 153.6 lb

## 2023-03-29 DIAGNOSIS — I5032 Chronic diastolic (congestive) heart failure: Secondary | ICD-10-CM

## 2023-03-29 DIAGNOSIS — I480 Paroxysmal atrial fibrillation: Secondary | ICD-10-CM | POA: Diagnosis not present

## 2023-03-29 DIAGNOSIS — I10 Essential (primary) hypertension: Secondary | ICD-10-CM

## 2023-03-29 MED ORDER — AMIODARONE HCL 200 MG PO TABS
100.0000 mg | ORAL_TABLET | Freq: Every day | ORAL | 1 refills | Status: DC
Start: 2023-03-29 — End: 2023-04-20

## 2023-03-29 NOTE — Progress Notes (Signed)
Primary Physician:  [facility]   Patient ID: Zoe [name], female    DOB: 09/15/1934, 89 y.o.   MRN: [id number]  Subjective:    Chief Complaint  Patient presents with   AV replacement   Hypertension   Acute on chronic diastolic (congestive) heart failure   Follow-up    6 months    HPI: Zoe [name]  is a 89 y.o. Caucasian female with history of possibly mild coronary spasm by coronary angiogram in 2014,  severe AS s/p TAVR by Dr. Cornelius Moras on 09/03/2018, asymptomatic bilateral carotid artery stenosis,  essential hypertension, hyperlipidemia, hyperglycemia and GERD. She underwent T8 kyphoplasty on 09/07/2022 without any postprocedural cardiac complications.  Patient also going through chemotherapy, presently diagnosed with multiple myeloma.  Patient was admitted 04/03/2022 - 04/05/2022 with acute on chronic diastolic heart failure and hyponatremia (likely dilutional), as well as AKI.    Last seen by me on 03/01/2023 for acute decompensated diastolic heart failure and new onset atrial fibrillation when she was hospitalized with diastolic heart failure and new onset A-fib with RVR.  She now presents for a 6-week follow-up on her last office visit because she was back in A-fib with RVR I restarted amiodarone which was discontinued during the hospitalization.  She now presents for routine follow-up, feeling well.  Past Medical History:  Diagnosis Date   Arthritis    some - per patient   Breast cancer (HCC)    breast cancer / left    Cataract    bilat    GERD (gastroesophageal reflux disease)    History of kidney stones    Hyperlipidemia    Hypertension    Hypothyroidism    Macular degeneration    Left   S/P TAVR (transcatheter aortic valve replacement) 09/03/2018   23 mm Edwards Sapien 3 transcatheter heart valve placed via percutaneous right transfemoral approach    Severe aortic stenosis    Stress incontinence    Thyroid disease    Tinnitus     Social History   Tobacco Use   Smoking status: Never   Smokeless tobacco: Never  Substance Use Topics   Alcohol use: No     Review of Systems  Cardiovascular:  Negative for chest pain, dyspnea on exertion and leg swelling.  Respiratory:  Positive for cough.    Objective:  Blood pressure 129/66. There is no height or weight on file to calculate BMI.     03/29/2023   11:15 AM 03/23/2023   10:47 AM 03/23/2023    9:14 AM  Vitals with BMI  Height   5\' 3"   Weight   155 lbs 8 oz  BMI   27.55  Systolic 129 138 409  Diastolic 66 54 59  Pulse  64 67      Physical Exam Neck:     Vascular: No carotid bruit or JVD.  Cardiovascular:     Rate and Rhythm: Normal rate and regular rhythm.     Pulses: Intact distal pulses.     Heart sounds: Normal heart sounds. No murmur heard.    No gallop.  Pulmonary:     Effort: Pulmonary effort is normal.     Breath sounds: Normal breath sounds.  Abdominal:     General: Bowel sounds are normal.     Palpations: Abdomen is soft.  Musculoskeletal:     Right lower leg: No edema.     Left lower leg: No edema.    Radiology: No results found.  Laboratory  examination:   Lab Results  Component Value Date   NA 133 (L) 03/23/2023   K 4.5 03/23/2023   CO2 28 03/23/2023   GLUCOSE 117 (H) 03/23/2023   BUN 17 03/23/2023   CREATININE 0.85 03/23/2023   CALCIUM 8.4 (L) 03/23/2023   EGFR 59 (L) 06/19/2022   GFRNONAA >60 03/23/2023       Latest Ref Rng & Units 03/23/2023    8:45 AM 03/16/2023   12:29 PM 03/09/2023    9:33 AM  CMP  Glucose 70 - 99 mg/dL 811  914  782   BUN 8 - 23 mg/dL 17  15  18    Creatinine 0.44 - 1.00 mg/dL 9.56  2.13  0.86   Sodium 135 - 145 mmol/L 133  132  132   Potassium 3.5 - 5.1 mmol/L 4.5  4.6  4.0   Chloride 98 - 111 mmol/L 96  98  98   CO2 22 - 32 mmol/L 28  25  26    Calcium 8.9 - 10.3 mg/dL 8.4  8.3  8.3   Total Protein 6.5 - 8.1 g/dL 5.8  5.9  5.9   Total Bilirubin 0.3 - 1.2 mg/dL 0.8  0.5  1.0   Alkaline Phos  38 - 126 U/L 74  78  74   AST 15 - 41 U/L 10  11  10    ALT 0 - 44 U/L 8  10  16        Latest Ref Rng & Units 03/23/2023    8:45 AM 03/16/2023   12:29 PM 03/09/2023    9:33 AM  CBC  WBC 4.0 - 10.5 K/uL 5.6  3.3  4.2   Hemoglobin 12.0 - 15.0 g/dL 57.8  46.9  62.9   Hematocrit 36.0 - 46.0 % 39.9  41.6  39.8   Platelets 150 - 400 K/uL 152  162  126    Lipid Panel     Component Value Date/Time   CHOL 103 07/16/2019 0941   TRIG 75 07/16/2019 0941   HDL 48 07/16/2019 0941   LDLCALC 39 07/16/2019 0941   LDLDIRECT 48 07/16/2019 0941   HEMOGLOBIN A1C Lab Results  Component Value Date   HGBA1C 5.8 (H) 07/16/2019   MPG 111.15 08/30/2018   TSH Recent Labs    04/02/22 1830 02/17/23 0922  TSH 0.891 1.499   BNP (last 3 results) Recent Labs    05/15/22 2020 06/19/22 1012 02/17/23 0922  BNP 744.7* 176.8* 454.3*   External labs  Labs 07/10/2022:  TSH 0.503, normal.  Serum osmolality 287, urinary osmolality 90  Total cholesterol 113, triglycerides 100, HDL 43, LDL 50.   Radiology:   Chest x-ray 03/23/2023: Cardiomegaly and aortic valve replacement again noted.   Mild bibasilar atelectasis/scarring again noted.  There is no evidence of focal airspace disease, pulmonary edema, suspicious pulmonary nodule/mass, pleural effusion, or pneumothorax. No acute bony abnormalities are identified. Vertebral compression fractures and augmentation again noted.  Cardiac Studies:    Coronary angiogram 08/06/2018: normal coronary arteries. Moderate pulmonary hypertension due to AS with elevated PW. Preserved CO and CI.  TAVR 09/03/2018: Edwards Sapien 3 THV (size 23 mm, model # 9600TFX, serial # [id number]) by Ashley Mariner and Earney Hamburg.  CXR PA/LAT 11/07/2018:  Cardiac shadow is enlarged but stable. Changes of prior TAVR ar again noted. Aortic calcifications are seen. Previously noted left upper lobe pneumonia has resolved in the interval. No focal infiltrate or sizable effusion is seen.  Changes of prior vertebral augmentation  are noted.  Carotid artery duplex 01/05/2021: Minimal stenosis in the right internal carotid artery (1-15%). Stenosis in the right external carotid artery (<50%). Duplex suggests stenosis in the left internal carotid artery (50-69%). Stenosis in the left external carotid artery (<50%). Antegrade right vertebral artery flow. Antegrade left vertebral artery flow. Follow up in six months is appropriate if clinically indicated.  No significant change since 06/23/2020.  ECHOCARDIOGRAM COMPLETE 02/17/2023:  1. Left ventricular ejection fraction, by estimation, is 55 to 60%. The left ventricle has normal function. The left ventricle has no regional wall motion abnormalities. There is mild left ventricular hypertrophy. Left ventricular diastolic parameters  are consistent with Grade II diastolic dysfunction (pseudonormalization). Elevated left atrial pressure.  2. Right ventricular systolic function is normal. The right ventricular size is normal. There is mildly elevated pulmonary artery systolic pressure.  3. Left atrial size was severely dilated.  4. The mitral valve is abnormal. Mild mitral valve regurgitation. Moderate mitral stenosis.  5. The tricuspid valve is abnormal.  6. Edwards Sapien 3 THV size 23 mm is in the AV position. . The aortic valve has been repaired/replaced. Aortic valve regurgitation is not visualized. No aortic stenosis is present.  7. The inferior vena cava is dilated in size with >50% respiratory variability, suggesting right atrial pressure of 8 mmHg.  Carotid artery duplex 09/21/2022:  Duplex suggests stenosis in the right internal carotid artery (1-15%).  Duplex suggests stenosis in the left internal carotid artery (16-49%).  Antegrade right vertebral artery flow. Antegrade left vertebral artery flow.  Compared to the study done on 02/15/2022, left ICA stenosis of >50% has now regressed.  Otherwise no significant change.  There is  mild to moderate heterogenous plaque noted in the bilateral carotid arteries. Follow up in one year is appropriate if clinically indicated.   EKG   EKG 03/29/2023: Normal sinus rhythm at the rate of 77 bpm, leftward enlargement, otherwise normal EKG.  Compared to 03/01/2023, atrial fibrillation no longer present.  Allergies and Medications:   Allergies  Allergen Reactions   Penicillins Other (See Comments)    UNSPECIFIED REACTION  Patient does not remember reaction.  Has patient had a PCN reaction causing immediate rash, facial/tongue/throat swelling, SOB or lightheadedness with hypotension: no Has patient had a PCN reaction causing severe rash involving mucus membranes or skin necrosis: no Has patient had a PCN reaction that required hospitalization no Has patient had a PCN reaction occurring within the last 10 years: no If all of the above answers are "NO", then may proceed with Cephalosporin use.    Sulfa Antibiotics Other (See Comments)    UNSPECIFIED REACTION  "maybe vision issues? "    Current Outpatient Medications:    acyclovir (ZOVIRAX) 400 MG tablet, Take 1 tablet (400 mg total) by mouth 2 (two) times daily., Disp: 60 tablet, Rfl: 3   apixaban (ELIQUIS) 5 MG TABS tablet, Take 1 tablet (5 mg total) by mouth 2 (two) times daily., Disp: 180 tablet, Rfl: 1   Artificial Tear Solution (SOOTHE XP) SOLN, Place 1 drop into both eyes every evening., Disp: , Rfl:    benzonatate (TESSALON) 100 MG capsule, Take 1 capsule (100 mg total) by mouth 3 (three) times daily as needed for cough., Disp: 20 capsule, Rfl: 0   Cholecalciferol (VITAMIN D3) 50 MCG (2000 UT) capsule, Take 1 capsule (2,000 Units total) by mouth daily., Disp: , Rfl:    [employer] VITAMIN B12 1000 MCG tablet, TAKE 1 TABLET BY MOUTH EVERY DAY, Disp: 90  tablet, Rfl: 1   dapagliflozin propanediol (FARXIGA) 10 MG TABS tablet, TAKE 1 TABLET BY MOUTH EVERY DAY, Disp: 90 tablet, Rfl: 2   dexamethasone (DECADRON) 4 MG tablet, Take 5 tablets  (20 mg total) by mouth once a week. Take 20 mg (5 tablets) on day of multiple myeloma treatment, Disp: 20 tablet, Rfl: 5   esomeprazole (NEXIUM) 20 MG capsule, Take 20 mg by mouth daily as needed (Heartburn)., Disp: , Rfl:    furosemide (LASIX) 40 MG tablet, Take 40 mg by mouth daily as needed for fluid., Disp: , Rfl:    guaiFENesin (MUCINEX) 600 MG 12 hr tablet, Take 600 mg by mouth 2 (two) times daily., Disp: , Rfl:    hydrocortisone cream 1 %, Apply 1 Application topically 2 (two) times daily., Disp: 120 g, Rfl: 0   lenalidomide (REVLIMID) 25 MG capsule, Take 1 capsule (25 mg total) by mouth daily. Take for 14 days,then none for 7 days.Repeat every 21 days. Auth # [id number]  Date obtained 03/21/23, Disp: 14 capsule, Rfl: 0   levothyroxine (SYNTHROID, LEVOTHROID) 100 MCG tablet, Take 100 mcg by mouth daily before breakfast., Disp: , Rfl: 2   metoprolol succinate (TOPROL-XL) 25 MG 24 hr tablet, Take 1 tablet (25 mg total) by mouth daily., Disp: 30 tablet, Rfl: 2   OVER THE COUNTER MEDICATION, Take 1 tablet by mouth daily. AREDS, Disp: , Rfl:    polyethylene glycol (MIRALAX / GLYCOLAX) 17 g packet, Take 17 g by mouth daily as needed for mild constipation., Disp: , Rfl:    potassium chloride (KLOR-CON M10) 10 MEQ tablet, TAKE 2 TABLETS BY MOUTH 2 TIMES DAILY., Disp: 360 tablet, Rfl: 3   pravastatin (PRAVACHOL) 40 MG tablet, Take 40 mg by mouth every evening., Disp: , Rfl:    sennosides-docusate sodium (SENOKOT-S) 8.6-50 MG tablet, Take 1 tablet by mouth daily., Disp: , Rfl:    triamcinolone ointment (KENALOG) 0.5 %, Apply 1 Application topically 2 (two) times daily., Disp: 30 g, Rfl: 0   amiodarone (PACERONE) 200 MG tablet, Take 0.5 tablets (100 mg total) by mouth daily., Disp: 45 tablet, Rfl: 1     Assessment:     ICD-10-CM   1. Paroxysmal atrial fibrillation (HCC)  I48.0 EKG 12-Lead    amiodarone (PACERONE) 200 MG tablet    2. Chronic diastolic (congestive) heart failure (HCC)  I50.32     3.  Primary hypertension  I10      Meds ordered this encounter  Medications   amiodarone (PACERONE) 200 MG tablet    Sig: Take 0.5 tablets (100 mg total) by mouth daily.    Dispense:  45 tablet    Refill:  1    Medications Discontinued During This Encounter  Medication Reason   aspirin EC 81 MG tablet    gabapentin (NEURONTIN) 300 MG capsule    prochlorperazine (COMPAZINE) 10 MG tablet    amiodarone (PACERONE) 200 MG tablet Reorder    Recommendations:   [name] is a fairly active 89 y.o. Caucasian female with history of possibly mild coronary spasm by coronary angiogram in 2014,  severe AS s/p TAVR by Dr. Cornelius Moras on 09/03/2018, asymptomatic bilateral carotid artery stenosis,  essential hypertension, hyperlipidemia, hyperglycemia and GERD. She underwent T8 kyphoplasty on 09/07/2022 without any postprocedural cardiac complications.  Patient also going through chemotherapy, presently diagnosed with multiple myeloma.  Last seen by me on 03/01/2023 for acute decompensated diastolic heart failure and new onset atrial fibrillation when she was hospitalized with diastolic  heart failure and new onset A-fib with RVR.  She now presents for a 6-week follow-up on her last office visit because she was back in A-fib with RVR I restarted amiodarone which was discontinued during the hospitalization.  She now presents for routine follow-up, feeling well.  1. Paroxysmal atrial fibrillation (HCC) Converted to sinus rhythm, also symptoms of heart failure is resolved with improvement in dyspnea.  Recent chest x-ray reviewed, bilateral pleural effusions have resolved.  She has no leg edema, no JVD.  She is maintaining sinus rhythm.  Will reduce the dose of the amiodarone from 200 mg daily to 100 mg daily.  She will continue with metoprolol succinate 1 p.o. daily 25 mg. - EKG 12-Lead - amiodarone (PACERONE) 200 MG tablet; Take 0.5 tablets (100 mg total) by mouth daily.  Dispense: 45 tablet; Refill: 1  2.  Chronic diastolic (congestive) heart failure (HCC) Presently on Farxiga for chronic diastolic heart failure, continue the same.  3. Primary hypertension Blood pressure is well-controlled on present medical regimen.  I would like to see her back in 6 months or sooner if problems.  I have extensively discussed with the patient regarding being careful with regard to salt intake as she would definitely precipitate atrial fibrillation and also congestive heart failure.  Her hyponatremia was related to heart failure which is now resolved by recent labs as she is now well compensated.  I would like to see her back in 6 months.     Yates Decamp, MD, [facility] 03/29/2023, 11:40 AM Office: [phone] Fax: [phone] Pager: [phone]

## 2023-03-30 ENCOUNTER — Inpatient Hospital Stay: Payer: Medicare Other

## 2023-03-30 ENCOUNTER — Other Ambulatory Visit: Payer: Medicare Other

## 2023-03-30 ENCOUNTER — Other Ambulatory Visit: Payer: Self-pay

## 2023-03-30 ENCOUNTER — Ambulatory Visit: Payer: Medicare Other

## 2023-03-30 ENCOUNTER — Inpatient Hospital Stay: Payer: Medicare Other | Attending: Physician Assistant

## 2023-03-30 VITALS — BP 146/58 | HR 73 | Temp 97.6°F | Resp 16 | Wt 154.8 lb

## 2023-03-30 DIAGNOSIS — Z7901 Long term (current) use of anticoagulants: Secondary | ICD-10-CM | POA: Insufficient documentation

## 2023-03-30 DIAGNOSIS — Z7984 Long term (current) use of oral hypoglycemic drugs: Secondary | ICD-10-CM | POA: Insufficient documentation

## 2023-03-30 DIAGNOSIS — R059 Cough, unspecified: Secondary | ICD-10-CM | POA: Diagnosis not present

## 2023-03-30 DIAGNOSIS — I4891 Unspecified atrial fibrillation: Secondary | ICD-10-CM | POA: Insufficient documentation

## 2023-03-30 DIAGNOSIS — R0602 Shortness of breath: Secondary | ICD-10-CM | POA: Diagnosis not present

## 2023-03-30 DIAGNOSIS — G629 Polyneuropathy, unspecified: Secondary | ICD-10-CM | POA: Diagnosis not present

## 2023-03-30 DIAGNOSIS — E538 Deficiency of other specified B group vitamins: Secondary | ICD-10-CM | POA: Insufficient documentation

## 2023-03-30 DIAGNOSIS — I959 Hypotension, unspecified: Secondary | ICD-10-CM | POA: Diagnosis not present

## 2023-03-30 DIAGNOSIS — E785 Hyperlipidemia, unspecified: Secondary | ICD-10-CM | POA: Insufficient documentation

## 2023-03-30 DIAGNOSIS — R079 Chest pain, unspecified: Secondary | ICD-10-CM | POA: Diagnosis not present

## 2023-03-30 DIAGNOSIS — Z79899 Other long term (current) drug therapy: Secondary | ICD-10-CM | POA: Diagnosis not present

## 2023-03-30 DIAGNOSIS — C9000 Multiple myeloma not having achieved remission: Secondary | ICD-10-CM | POA: Insufficient documentation

## 2023-03-30 DIAGNOSIS — D649 Anemia, unspecified: Secondary | ICD-10-CM | POA: Diagnosis not present

## 2023-03-30 DIAGNOSIS — Z5112 Encounter for antineoplastic immunotherapy: Secondary | ICD-10-CM | POA: Insufficient documentation

## 2023-03-30 DIAGNOSIS — R251 Tremor, unspecified: Secondary | ICD-10-CM | POA: Diagnosis not present

## 2023-03-30 DIAGNOSIS — R600 Localized edema: Secondary | ICD-10-CM | POA: Diagnosis not present

## 2023-03-30 DIAGNOSIS — D472 Monoclonal gammopathy: Secondary | ICD-10-CM

## 2023-03-30 DIAGNOSIS — R61 Generalized hyperhidrosis: Secondary | ICD-10-CM | POA: Diagnosis not present

## 2023-03-30 LAB — CMP (CANCER CENTER ONLY)
ALT: 10 U/L (ref 0–44)
AST: 12 U/L — ABNORMAL LOW (ref 15–41)
Albumin: 3.5 g/dL (ref 3.5–5.0)
Alkaline Phosphatase: 77 U/L (ref 38–126)
Anion gap: 10 (ref 5–15)
BUN: 21 mg/dL (ref 8–23)
CO2: 25 mmol/L (ref 22–32)
Calcium: 8.4 mg/dL — ABNORMAL LOW (ref 8.9–10.3)
Chloride: 96 mmol/L — ABNORMAL LOW (ref 98–111)
Creatinine: 0.91 mg/dL (ref 0.44–1.00)
GFR, Estimated: >60 mL/min (ref >60)
Glucose, Bld: 143 mg/dL — ABNORMAL HIGH (ref 70–99)
Potassium: 4.5 mmol/L (ref 3.5–5.1)
Sodium: 131 mmol/L — ABNORMAL LOW (ref 135–145)
Total Bilirubin: 0.5 mg/dL (ref 0.3–1.2)
Total Protein: 6.1 g/dL — ABNORMAL LOW (ref 6.5–8.1)

## 2023-03-30 LAB — VITAMIN B12: Vitamin B-12: 1405 pg/mL — ABNORMAL HIGH (ref 180–914)

## 2023-03-30 LAB — CBC WITH DIFFERENTIAL (CANCER CENTER ONLY)
Abs Immature Granulocytes: 0.03 10*3/uL (ref 0.00–0.07)
Basophils Absolute: 0 10*3/uL (ref 0.0–0.1)
Basophils Relative: 1 %
Eosinophils Absolute: 0 10*3/uL (ref 0.0–0.5)
Eosinophils Relative: 0 %
HCT: 40.5 % (ref 36.0–46.0)
Hemoglobin: 13.6 g/dL (ref 12.0–15.0)
Immature Granulocytes: 1 %
Lymphocytes Relative: 3 %
Lymphs Abs: 0.2 10*3/uL — ABNORMAL LOW (ref 0.7–4.0)
MCH: 28.8 pg (ref 26.0–34.0)
MCHC: 33.6 g/dL (ref 30.0–36.0)
MCV: 85.6 fL (ref 80.0–100.0)
Monocytes Absolute: 0.2 10*3/uL (ref 0.1–1.0)
Monocytes Relative: 4 %
Neutro Abs: 5.2 10*3/uL (ref 1.7–7.7)
Neutrophils Relative %: 91 %
Platelet Count: 115 10*3/uL — ABNORMAL LOW (ref 150–400)
RBC: 4.73 MIL/uL (ref 3.87–5.11)
RDW: 16.6 % — ABNORMAL HIGH (ref 11.5–15.5)
WBC Count: 5.7 10*3/uL (ref 4.0–10.5)
nRBC: 0 % (ref 0.0–0.2)

## 2023-03-30 LAB — HEMOGLOBIN A1C
Hgb A1c MFr Bld: 5.2 % (ref 4.8–5.6)
Mean Plasma Glucose: 102.54 mg/dL

## 2023-03-30 LAB — LIPID PANEL
Cholesterol: 174 mg/dL (ref 0–200)
HDL: 59 mg/dL (ref >40)
LDL Cholesterol: 100 mg/dL — ABNORMAL HIGH (ref 0–99)
Total CHOL/HDL Ratio: 2.9 RATIO
Triglycerides: 76 mg/dL (ref <150)
VLDL: 15 mg/dL (ref 0–40)

## 2023-03-30 LAB — LACTATE DEHYDROGENASE: LDH: 236 U/L — ABNORMAL HIGH (ref 98–192)

## 2023-03-30 MED ORDER — ACETAMINOPHEN 325 MG PO TABS
650.0000 mg | ORAL_TABLET | Freq: Once | ORAL | Status: AC
  Administered 2023-03-30: 650 mg via ORAL
  Filled 2023-03-30: qty 2

## 2023-03-30 MED ORDER — DEXAMETHASONE 4 MG PO TABS: 20.0000 mg | ORAL_TABLET | Freq: Once | ORAL | Status: DC

## 2023-03-30 MED ORDER — DIPHENHYDRAMINE HCL 25 MG PO CAPS
50.0000 mg | ORAL_CAPSULE | Freq: Once | ORAL | Status: AC
  Administered 2023-03-30: 50 mg via ORAL
  Filled 2023-03-30: qty 2

## 2023-03-30 MED ORDER — DARATUMUMAB-HYALURONIDASE-FIHJ 1800-30000 MG-UT/15ML ~~LOC~~ SOLN
1800.0000 mg | Freq: Once | SUBCUTANEOUS | Status: AC
  Administered 2023-03-30: 1800 mg via SUBCUTANEOUS
  Filled 2023-03-30: qty 15

## 2023-03-30 NOTE — Progress Notes (Signed)
Patient states she took her dexamethasone at home before infusion.

## 2023-03-30 NOTE — Patient Instructions (Signed)
[name]  Discharge Instructions: Thank you for choosing [facility] to provide your oncology and hematology care.   If you have a lab appointment with the Cancer Center, please go directly to the Cancer Center and check in at the registration area.   Wear comfortable clothing and clothing appropriate for easy access to any Portacath or PICC line.   We strive to give you quality time with your provider. You may need to reschedule your appointment if you arrive late (15 or more minutes).  Arriving late affects you and other patients whose appointments are after yours.  Also, if you miss three or more appointments without notifying the office, you may be dismissed from the clinic at the provider's discretion.      For prescription refill requests, have your pharmacy contact our office and allow 72 hours for refills to be completed.    Today you received the following chemotherapy and/or immunotherapy agents Darzalex Faspro      To help prevent nausea and vomiting after your treatment, we encourage you to take your nausea medication as directed.  BELOW ARE SYMPTOMS THAT SHOULD BE REPORTED IMMEDIATELY: *FEVER GREATER THAN 100.4 F (38 C) OR HIGHER *CHILLS OR SWEATING *NAUSEA AND VOMITING THAT IS NOT CONTROLLED WITH YOUR NAUSEA MEDICATION *UNUSUAL SHORTNESS OF BREATH *UNUSUAL BRUISING OR BLEEDING *URINARY PROBLEMS (pain or burning when urinating, or frequent urination) *BOWEL PROBLEMS (unusual diarrhea, constipation, pain near the anus) TENDERNESS IN MOUTH AND THROAT WITH OR WITHOUT PRESENCE OF ULCERS (sore throat, sores in mouth, or a toothache) UNUSUAL RASH, SWELLING OR PAIN  UNUSUAL VAGINAL DISCHARGE OR ITCHING   Items with * indicate a potential emergency and should be followed up as soon as possible or go to the Emergency Department if any problems should occur.  Please show the CHEMOTHERAPY ALERT CARD or IMMUNOTHERAPY ALERT CARD at  check-in to the Emergency Department and triage nurse.  Should you have questions after your visit or need to cancel or reschedule your appointment, please contact [place] [facility]  Dept: [phone]  and follow the prompts.  Office hours are 8:00 a.m. to 4:30 p.m. Monday - Friday. Please note that voicemails left after 4:00 p.m. may not be returned until the following business day.  We are closed weekends and major holidays. You have access to a nurse at all times for urgent questions. Please call the main number to the clinic Dept: [phone] and follow the prompts.   For any non-urgent questions, you may also contact your provider using MyChart. We now offer e-Visits for anyone 18 and older to request care online for non-urgent symptoms. For details visit [email or web address].   Also download the MyChart app! Go to the app store, search "MyChart", open the app, select [place], and log in with your MyChart username and password.  

## 2023-04-03 ENCOUNTER — Other Ambulatory Visit: Payer: Self-pay | Admitting: Hematology

## 2023-04-04 ENCOUNTER — Other Ambulatory Visit: Payer: Medicare Other

## 2023-04-04 ENCOUNTER — Ambulatory Visit: Payer: Medicare Other

## 2023-04-04 ENCOUNTER — Ambulatory Visit: Payer: Medicare Other | Admitting: Hematology and Oncology

## 2023-04-06 ENCOUNTER — Other Ambulatory Visit: Payer: Self-pay

## 2023-04-06 ENCOUNTER — Inpatient Hospital Stay: Payer: Medicare Other

## 2023-04-06 ENCOUNTER — Inpatient Hospital Stay: Payer: Medicare Other | Admitting: Physician Assistant

## 2023-04-06 VITALS — BP 134/62 | HR 77 | Temp 97.5°F | Resp 18 | Ht 63.0 in | Wt 153.4 lb

## 2023-04-06 DIAGNOSIS — D649 Anemia, unspecified: Secondary | ICD-10-CM | POA: Diagnosis not present

## 2023-04-06 DIAGNOSIS — C9000 Multiple myeloma not having achieved remission: Secondary | ICD-10-CM | POA: Diagnosis not present

## 2023-04-06 DIAGNOSIS — I959 Hypotension, unspecified: Secondary | ICD-10-CM | POA: Diagnosis not present

## 2023-04-06 DIAGNOSIS — N898 Other specified noninflammatory disorders of vagina: Secondary | ICD-10-CM | POA: Diagnosis not present

## 2023-04-06 DIAGNOSIS — Z7984 Long term (current) use of oral hypoglycemic drugs: Secondary | ICD-10-CM | POA: Diagnosis not present

## 2023-04-06 DIAGNOSIS — G629 Polyneuropathy, unspecified: Secondary | ICD-10-CM | POA: Diagnosis not present

## 2023-04-06 DIAGNOSIS — R052 Subacute cough: Secondary | ICD-10-CM

## 2023-04-06 DIAGNOSIS — I4891 Unspecified atrial fibrillation: Secondary | ICD-10-CM | POA: Diagnosis not present

## 2023-04-06 DIAGNOSIS — R0602 Shortness of breath: Secondary | ICD-10-CM | POA: Diagnosis not present

## 2023-04-06 DIAGNOSIS — E785 Hyperlipidemia, unspecified: Secondary | ICD-10-CM | POA: Diagnosis not present

## 2023-04-06 DIAGNOSIS — R079 Chest pain, unspecified: Secondary | ICD-10-CM | POA: Diagnosis not present

## 2023-04-06 DIAGNOSIS — Z79899 Other long term (current) drug therapy: Secondary | ICD-10-CM | POA: Diagnosis not present

## 2023-04-06 DIAGNOSIS — R600 Localized edema: Secondary | ICD-10-CM | POA: Diagnosis not present

## 2023-04-06 DIAGNOSIS — R059 Cough, unspecified: Secondary | ICD-10-CM | POA: Diagnosis not present

## 2023-04-06 DIAGNOSIS — R251 Tremor, unspecified: Secondary | ICD-10-CM | POA: Diagnosis not present

## 2023-04-06 DIAGNOSIS — Z5112 Encounter for antineoplastic immunotherapy: Secondary | ICD-10-CM | POA: Diagnosis not present

## 2023-04-06 DIAGNOSIS — Z7901 Long term (current) use of anticoagulants: Secondary | ICD-10-CM | POA: Diagnosis not present

## 2023-04-06 DIAGNOSIS — R61 Generalized hyperhidrosis: Secondary | ICD-10-CM | POA: Diagnosis not present

## 2023-04-06 DIAGNOSIS — E538 Deficiency of other specified B group vitamins: Secondary | ICD-10-CM | POA: Diagnosis not present

## 2023-04-06 LAB — CBC WITH DIFFERENTIAL (CANCER CENTER ONLY)
Abs Immature Granulocytes: 0.01 10*3/uL (ref 0.00–0.07)
Basophils Absolute: 0 10*3/uL (ref 0.0–0.1)
Basophils Relative: 1 %
Eosinophils Absolute: 0.5 10*3/uL (ref 0.0–0.5)
Eosinophils Relative: 10 %
HCT: 41.3 % (ref 36.0–46.0)
Hemoglobin: 13.5 g/dL (ref 12.0–15.0)
Immature Granulocytes: 0 %
Lymphocytes Relative: 6 %
Lymphs Abs: 0.3 10*3/uL — ABNORMAL LOW (ref 0.7–4.0)
MCH: 28.7 pg (ref 26.0–34.0)
MCHC: 32.7 g/dL (ref 30.0–36.0)
MCV: 87.9 fL (ref 80.0–100.0)
Monocytes Absolute: 1.1 10*3/uL — ABNORMAL HIGH (ref 0.1–1.0)
Monocytes Relative: 21 %
Neutro Abs: 3.2 10*3/uL (ref 1.7–7.7)
Neutrophils Relative %: 62 %
Platelet Count: 142 10*3/uL — ABNORMAL LOW (ref 150–400)
RBC: 4.7 MIL/uL (ref 3.87–5.11)
RDW: 17 % — ABNORMAL HIGH (ref 11.5–15.5)
WBC Count: 5.1 10*3/uL (ref 4.0–10.5)
nRBC: 0 % (ref 0.0–0.2)

## 2023-04-06 LAB — CMP (CANCER CENTER ONLY)
ALT: 10 U/L (ref 0–44)
AST: 11 U/L — ABNORMAL LOW (ref 15–41)
Albumin: 3.2 g/dL — ABNORMAL LOW (ref 3.5–5.0)
Alkaline Phosphatase: 78 U/L (ref 38–126)
Anion gap: 8 (ref 5–15)
BUN: 14 mg/dL (ref 8–23)
CO2: 26 mmol/L (ref 22–32)
Calcium: 8.5 mg/dL — ABNORMAL LOW (ref 8.9–10.3)
Chloride: 97 mmol/L — ABNORMAL LOW (ref 98–111)
Creatinine: 0.73 mg/dL (ref 0.44–1.00)
GFR, Estimated: >60 mL/min (ref >60)
Glucose, Bld: 151 mg/dL — ABNORMAL HIGH (ref 70–99)
Potassium: 4 mmol/L (ref 3.5–5.1)
Sodium: 131 mmol/L — ABNORMAL LOW (ref 135–145)
Total Bilirubin: 0.5 mg/dL (ref 0.3–1.2)
Total Protein: 5.4 g/dL — ABNORMAL LOW (ref 6.5–8.1)

## 2023-04-06 LAB — LACTATE DEHYDROGENASE: LDH: 234 U/L — ABNORMAL HIGH (ref 98–192)

## 2023-04-06 MED ORDER — ACETAMINOPHEN 325 MG PO TABS
650.0000 mg | ORAL_TABLET | Freq: Once | ORAL | Status: AC
  Administered 2023-04-06: 650 mg via ORAL
  Filled 2023-04-06: qty 2

## 2023-04-06 MED ORDER — DEXAMETHASONE 4 MG PO TABS
20.0000 mg | ORAL_TABLET | Freq: Once | ORAL | Status: DC
  Filled 2023-04-06: qty 5

## 2023-04-06 MED ORDER — DARATUMUMAB-HYALURONIDASE-FIHJ 1800-30000 MG-UT/15ML ~~LOC~~ SOLN
1800.0000 mg | Freq: Once | SUBCUTANEOUS | Status: AC
  Administered 2023-04-06: 1800 mg via SUBCUTANEOUS
  Filled 2023-04-06: qty 15

## 2023-04-06 MED ORDER — NYSTATIN 100000 UNIT/GM EX POWD: 1.0000 | Freq: Three times a day (TID) | CUTANEOUS | 0 refills | Status: DC

## 2023-04-06 MED ORDER — DIPHENHYDRAMINE HCL 25 MG PO CAPS
50.0000 mg | ORAL_CAPSULE | Freq: Once | ORAL | Status: AC
  Administered 2023-04-06: 50 mg via ORAL
  Filled 2023-04-06: qty 2

## 2023-04-06 NOTE — Patient Instructions (Signed)
[place] [facility]  Discharge Instructions: Thank you for choosing [facility] to provide your oncology and hematology care.   If you have a lab appointment with the Cancer Center, please go directly to the Cancer Center and check in at the registration area.   Wear comfortable clothing and clothing appropriate for easy access to any Portacath or PICC line.   We strive to give you quality time with your provider. You may need to reschedule your appointment if you arrive late (15 or more minutes).  Arriving late affects you and other patients whose appointments are after yours.  Also, if you miss three or more appointments without notifying the office, you may be dismissed from the clinic at the provider's discretion.      For prescription refill requests, have your pharmacy contact our office and allow 72 hours for refills to be completed.    Today you received the following chemotherapy and/or immunotherapy agents: daratumumab-hyaluronidase-fihj      To help prevent nausea and vomiting after your treatment, we encourage you to take your nausea medication as directed.  BELOW ARE SYMPTOMS THAT SHOULD BE REPORTED IMMEDIATELY: *FEVER GREATER THAN 100.4 F (38 C) OR HIGHER *CHILLS OR SWEATING *NAUSEA AND VOMITING THAT IS NOT CONTROLLED WITH YOUR NAUSEA MEDICATION *UNUSUAL SHORTNESS OF BREATH *UNUSUAL BRUISING OR BLEEDING *URINARY PROBLEMS (pain or burning when urinating, or frequent urination) *BOWEL PROBLEMS (unusual diarrhea, constipation, pain near the anus) TENDERNESS IN MOUTH AND THROAT WITH OR WITHOUT PRESENCE OF ULCERS (sore throat, sores in mouth, or a toothache) UNUSUAL RASH, SWELLING OR PAIN  UNUSUAL VAGINAL DISCHARGE OR ITCHING   Items with * indicate a potential emergency and should be followed up as soon as possible or go to the Emergency Department if any problems should occur.  Please show the CHEMOTHERAPY ALERT CARD or  IMMUNOTHERAPY ALERT CARD at check-in to the Emergency Department and triage nurse.  Should you have questions after your visit or need to cancel or reschedule your appointment, please contact [place] [facility]  Dept: [phone]  and follow the prompts.  Office hours are 8:00 a.m. to 4:30 p.m. Monday - Friday. Please note that voicemails left after 4:00 p.m. may not be returned until the following business day.  We are closed weekends and major holidays. You have access to a nurse at all times for urgent questions. Please call the main number to the clinic Dept: [phone] and follow the prompts.   For any non-urgent questions, you may also contact your provider using MyChart. We now offer e-Visits for anyone 18 and older to request care online for non-urgent symptoms. For details visit [email or web address].   Also download the MyChart app! Go to the app store, search "MyChart", open the app, select [place], and log in with your MyChart username and password.   

## 2023-04-06 NOTE — Progress Notes (Signed)
[facility] [phone]   [phone]   PROGRESS NOTE   Patient Care Team: [facility] as PCP - General (Rheumatology)   Hematological/Oncological History # IgG Lambda Multiple Myeloma  06/14/2022: establish care with Zoe [name] due to anemia. Labs showed M protein 4.9, Kappa 7.2, Lambda 10.8, ratio 0.67 07/13/2022: Bmbx showed Lambda restricted plasma cell neoplasm involving approximately 60% of the cellular marrow by IHC on the biopsy.  08/01/2022: Cycle 1 Day 1 of VRd chemotherapy. (Holding revlimid initially) 08/21/2022:  Cycle 2 Day 1 of VRd chemotherapy. (Holding revlimid) 09/04/2022: Cycle 3 Day 1 of VRd chemotherapy. Started revlimid 10/03/2022: Cycle 4 Day 1 of VRd chemotherapy 10/31/2022: Cycle 5 Day 1 of VRd chemotherapy 11/20/2022: Cycle 6 Day 1 of VRd chemotherapy 12/11/2022: Cycle 7 Day 1 of VRd chemotherapy 01/02/2023: Cycle 8 Day 1 of VRd chemotherapy 01/22/2023: Cycle 9 Day 1 of VRd chemotherapy 02/23/2023: Day 1 Cycle 1 of Dara/Rev/Dex 03/23/2023: Day 1 Cycle 2 of Dara/Rev/Dex    INTERVAL HISTORY: Zoe [name] 89 y.o. female returns to the clinic today for a follow-up visit accompanied by her daughter-in-law. Her last visit was on 03/23/2023. In the interim, she continued on Dara/Rev/Dex.   Zoe [name] reports she is tolerating the Darzalex therapy well without any new symptoms.  She reports that she continues to have a cough that bothers her the most when she is lying down to sleep. She reports the cough is mainly dry but occasional will produce a clear sputum. She is taking tessalon perles TID with some improvement. She reports having new onset vaginal itching with increased moisture in the inguinal area. She denies a rash associated with the itching.  She denies nausea, vomiting or abdominal pain. Her bowel habits are unchanged without recurrent episodes of diarrhea or constipation. She has no other complaints. She  is willing to proceed with treatment today.   MEDICAL HISTORY: Past Medical History:  Diagnosis Date   Arthritis    some - per patient   Breast cancer (HCC)    breast cancer / left    Cataract    bilat    GERD (gastroesophageal reflux disease)    History of kidney stones    Hyperlipidemia    Hypertension    Hypothyroidism    Macular degeneration    Left   S/P TAVR (transcatheter aortic valve replacement) 09/03/2018   23 mm Edwards Sapien 3 transcatheter heart valve placed via percutaneous right transfemoral approach    Severe aortic stenosis    Stress incontinence    Thyroid disease    Tinnitus     ALLERGIES:  is allergic to penicillins and sulfa antibiotics.  MEDICATIONS:  Current Outpatient Medications  Medication Sig Dispense Refill   acyclovir (ZOVIRAX) 400 MG tablet Take 1 tablet (400 mg total) by mouth 2 (two) times daily. 60 tablet 3   amiodarone (PACERONE) 200 MG tablet Take 0.5 tablets (100 mg total) by mouth daily. 45 tablet 1   apixaban (ELIQUIS) 5 MG TABS tablet Take 1 tablet (5 mg total) by mouth 2 (two) times daily. 180 tablet 1   Artificial Tear Solution (SOOTHE XP) SOLN Place 1 drop into both eyes every evening.     benzonatate (TESSALON) 100 MG capsule TAKE 1 CAPSULE BY MOUTH THREE TIMES A DAY AS NEEDED FOR COUGH 20 capsule 0   Cholecalciferol (VITAMIN D3) 50 MCG (2000 UT) capsule Take 1 capsule (2,000 Units total) by mouth daily.     [employer] VITAMIN  B12 1000 MCG tablet TAKE 1 TABLET BY MOUTH EVERY DAY 90 tablet 1   dapagliflozin propanediol (FARXIGA) 10 MG TABS tablet TAKE 1 TABLET BY MOUTH EVERY DAY 90 tablet 2   dexamethasone (DECADRON) 4 MG tablet Take 5 tablets (20 mg total) by mouth once a week. Take 20 mg (5 tablets) on day of multiple myeloma treatment 20 tablet 5   esomeprazole (NEXIUM) 20 MG capsule Take 20 mg by mouth daily as needed (Heartburn).     furosemide (LASIX) 40 MG tablet Take 40 mg by mouth daily as needed for fluid.     guaiFENesin  (MUCINEX) 600 MG 12 hr tablet Take 600 mg by mouth 2 (two) times daily.     hydrocortisone cream 1 % Apply 1 Application topically 2 (two) times daily. 120 g 0   lenalidomide (REVLIMID) 25 MG capsule Take 1 capsule (25 mg total) by mouth daily. Take for 14 days,then none for 7 days.Repeat every 21 days. Auth # [id number]  Date obtained 03/21/23 14 capsule 0   levothyroxine (SYNTHROID, LEVOTHROID) 100 MCG tablet Take 100 mcg by mouth daily before breakfast.  2   metoprolol succinate (TOPROL-XL) 25 MG 24 hr tablet Take 1 tablet (25 mg total) by mouth daily. 30 tablet 2   nystatin (MYCOSTATIN/NYSTOP) powder Apply 1 Application topically 3 (three) times daily. 15 g 0   OVER THE COUNTER MEDICATION Take 1 tablet by mouth daily. AREDS     polyethylene glycol (MIRALAX / GLYCOLAX) 17 g packet Take 17 g by mouth daily as needed for mild constipation.     potassium chloride (KLOR-CON M10) 10 MEQ tablet TAKE 2 TABLETS BY MOUTH 2 TIMES DAILY. 360 tablet 3   pravastatin (PRAVACHOL) 40 MG tablet Take 40 mg by mouth every evening.     sennosides-docusate sodium (SENOKOT-S) 8.6-50 MG tablet Take 1 tablet by mouth daily.     triamcinolone ointment (KENALOG) 0.5 % Apply 1 Application topically 2 (two) times daily. 30 g 0   No current facility-administered medications for this visit.    SURGICAL HISTORY:  Past Surgical History:  Procedure Laterality Date   ABDOMINAL HYSTERECTOMY  1970's   BACK SURGERY     BREAST LUMPECTOMY  12/1998   lumpectomy   CARDIAC CATHETERIZATION     EYE SURGERY     cataract surgery bilat    INTRAOPERATIVE TRANSTHORACIC ECHOCARDIOGRAM N/A 09/03/2018   Procedure: INTRAOPERATIVE TRANSTHORACIC ECHOCARDIOGRAM;  Surgeon: Kathleene Hazel, MD;  Location: [facility] OR;  Service: Open Heart Surgery;  Laterality: N/A;   KYPHOPLASTY N/A 09/07/2022   Procedure: THORACIC EIGHT KYPHOPLASTY;  Surgeon: Venita Lick, MD;  Location: [facility] OR;  Service: Orthopedics;  Laterality: N/A;  1 hr Local with IV  Regional 3 C-Bed   LITHOTRIPSY     Right total knee     2018 Dr. Charlann Boxer   RIGHT/LEFT HEART CATH AND CORONARY ANGIOGRAPHY N/A 08/06/2018   Procedure: RIGHT/LEFT HEART CATH AND CORONARY ANGIOGRAPHY;  Surgeon: Yates Decamp, MD;  Location: [facility] INVASIVE CV LAB;  Service: Cardiovascular;  Laterality: N/A;   THYROIDECTOMY, PARTIAL  1975   TONSILLECTOMY     as a child - patient not sure of exact date   TOTAL KNEE ARTHROPLASTY Left 03/13/2016   Procedure: TOTAL KNEE ARTHROPLASTY;  Surgeon: Durene Romans, MD;  Location: [facility] ORS;  Service: Orthopedics;  Laterality: Left;   TOTAL KNEE ARTHROPLASTY Right 06/18/2017   Procedure: RIGHT TOTAL KNEE ARTHROPLASTY;  Surgeon: Durene Romans, MD;  Location: [facility] ORS;  Service: Orthopedics;  Laterality: Right;   TRANSCATHETER AORTIC VALVE REPLACEMENT, TRANSFEMORAL N/A 09/03/2018   Procedure: TRANSCATHETER AORTIC VALVE REPLACEMENT, TRANSFEMORAL;  Surgeon: Kathleene Hazel, MD;  Location: [facility] OR;  Service: Open Heart Surgery;  Laterality: N/A;   REVIEW OF SYSTEMS:   Constitutional: ( - ) fevers, ( - )  chills , ( - ) night sweats Eyes: ( - ) blurriness of vision, ( - ) double vision, ( - ) watery eyes Ears, nose, mouth, throat, and face: ( - ) mucositis, ( - ) sore throat Respiratory: ( +) cough, ( - ) dyspnea, ( - ) wheezes Cardiovascular: ( - ) palpitation, ( - ) chest discomfort, ( +) lower extremity swelling Gastrointestinal:  ( - ) nausea, ( - ) heartburn, ( - ) change in bowel habits Skin: ( - ) abnormal skin rashes Lymphatics: ( - ) new lymphadenopathy, ( - ) easy bruising Neurological: ( - ) numbness, ( - ) tingling, ( - ) new weaknesses Behavioral/Psych: ( - ) mood change, ( - ) new changes  All other systems were reviewed with the patient and are negative.   PHYSICAL EXAMINATION:  Blood pressure 134/62, pulse 77, temperature (!) 97.5 F (36.4 C), temperature source Temporal, resp. rate 18, height 5\' 3"  (1.6 m), weight 153 lb 6.4 oz (69.6 kg), SpO2 100  %.  ECOG PERFORMANCE STATUS: 1  GENERAL: well appearing elderly Caucasian female, alert, no distress and comfortable SKIN: skin color, texture, turgor are normal, no rashes or significant lesions EYES: conjunctiva are pink and non-injected, sclera clear LUNGS:normal breathing effort. Mild wheezing and crackles in lower lobes b/l.  HEART: irregular rhythm ,normal rate and no murmurs. +1 bilateral lower extremity edema starting below the knee to ankles.  Musculoskeletal: no cyanosis of digits and no clubbing  PSYCH: alert & oriented x 3, fluent speech NEURO: no focal motor/sensory deficits    LABORATORY DATA: Lab Results  Component Value Date   WBC 5.1 04/06/2023   HGB 13.5 04/06/2023   HCT 41.3 04/06/2023   MCV 87.9 04/06/2023   PLT 142 (L) 04/06/2023      Chemistry      Component Value Date/Time   NA 131 (L) 04/06/2023 0734   NA 130 (L) 06/19/2022 1012   K 4.0 04/06/2023 0734   CL 97 (L) 04/06/2023 0734   CO2 26 04/06/2023 0734   BUN 14 04/06/2023 0734   BUN 21 06/19/2022 1012   CREATININE 0.73 04/06/2023 0734      Component Value Date/Time   CALCIUM 8.5 (L) 04/06/2023 0734   ALKPHOS 78 04/06/2023 0734   AST 11 (L) 04/06/2023 0734   ALT 10 04/06/2023 0734   BILITOT 0.5 04/06/2023 0734       RADIOGRAPHIC STUDIES:  DG Chest 2 View  Result Date: 03/26/2023 CLINICAL DATA:  Cough for 2 weeks EXAM: CHEST - 2 VIEW COMPARISON:  02/17/2023 and prior radiographs FINDINGS: Cardiomegaly and aortic valve replacement again noted. Mild bibasilar atelectasis/scarring again noted. There is no evidence of focal airspace disease, pulmonary edema, suspicious pulmonary nodule/mass, pleural effusion, or pneumothorax. No acute bony abnormalities are identified. Vertebral compression fractures and augmentation again noted. IMPRESSION: Cardiomegaly without evidence of acute cardiopulmonary disease. Electronically Signed   By: Harmon Pier M.D.   On: 03/26/2023 10:58   MR BRAIN W WO  CONTRAST  Result Date: 03/21/2023 CLINICAL DATA:  Brain/CNS neoplasm. Assess treatment response. Stroke symptoms. EXAM: MRI HEAD WITHOUT AND WITH CONTRAST TECHNIQUE: Multiplanar, multiecho pulse sequences of the brain and  surrounding structures were obtained without and with intravenous contrast. CONTRAST:  7mL GADAVIST GADOBUTROL 1 MMOL/ML IV SOLN COMPARISON:  Head CT 02/19/2021 FINDINGS: Brain: Diffusion imaging does not show any acute or subacute infarction. There are symmetric foci of T2 shine through within the deep white matter at the frontoparietal vertices. Mild chronic small-vessel ischemic change affects pons. Few old small vessel cerebellar infarctions on the right. Cerebral hemispheres show old small vessel infarctions within the deep and subcortical white matter. No evidence of primary or metastatic mass lesion, hemorrhage, hydrocephalus or extra-axial collection. After contrast administration, no abnormal enhancement occurs. Vascular: Flow Skull and upper cervical spine: Negative Sinuses/Orbits: Mucosal inflammatory changes of the paranasal sinuses with fluid levels in the maxillary sinuses consistent with rhinosinusitis. Orbits negative. Other: None IMPRESSION: 1. No acute brain finding. Chronic small-vessel ischemic changes of the pons, cerebral hemispheric white matter and right cerebellum. Symmetric foci of T2 shine through related to old white matter insults at the frontoparietal vertices do not represent true recent infarctions. 2. No evidence of intracranial neoplastic disease. 3. Mucosal inflammatory changes of the paranasal sinuses with fluid levels in the maxillary sinuses consistent with rhinosinusitis. Electronically Signed   By: Paulina Fusi M.D.   On: 03/21/2023 11:05     ASSESSMENT/PLAN:  Zoe [name] is a 89 y.o. female who presents to the clinic for evaluation for IgG lambda multiple myeloma.   # IgG Lambda Multiple Myeloma  --diagnosis of MM confirmed with bone marrow  biopsy showing 60% plasma cells and anemia -- Bone survey shows no lytic lesions, kidney function is within normal limits.  --08/01/2022 was Cycle 1 Day 1 of VRd  -Dr. Leonides Schanz discontinued velcade on 02/05/23 due to rash. She is status post day 15 cycle #9.  -Dr. Leonides Schanz changed the care plan to Daratumumab, Revlimid, and decadron. She is scheduled for day 1 cycle #1 today  -Started Cycle 1, Day 1 of Dara/Rev/Dex on 02/23/2023.  Plan:  --Due for Cycle 2, Day 15 of Dara/Rev/Dex today.  --Labs show white blood cell count 5.1, hemoglobin 13.5, MCV 87.9, and platelets of 142 --last M protein from 02/05/2023 trended down to 0.3 (4.9 prior to start of therapy). Normalized SFLC.  --RTC in 2 weeks with interval weekly treatment.   #Cough: --Chest xray from 03/23/2023 was negative for infectious process.  --Currently taking tessalon perles 100 mg TID with some improvement. --No fever and cough is mainly dry with occasional clear sputum. Cough worsens in supine position. --Encouraged to take mucinex and clairitin. Will send referral to pulmonology for further evaluation since cough is present for the last 3-4 weeks.   # Vaginal itching: --Sent prescription for nystatin powder    #Hypotension/BP/Atrial Fibrillation  --Follows with cardiology, Dr. Jacinto Halim -- Recommendations include metoprolol succinate 25 mg PO daily and restarted patient back on amiodarone 200 mg PO daily x 10 days followed by 12 mg daily.  --Continue on Eliquis 5 mg BID   #Bilateral lower extremity edema:  --Continue to wear compression stockings.    # Normocytic Anemia #Vitamin B12 Deficiency --Anemia likely driven by multiple myeloma, but may be component of vitamin B12 deficiency as well. --initial labs showed elevated MMA with B12 180 -- Continue vitamin B12 1000 mcg p.o. daily -- Continue to monitor   #Left leg/thigh neuropathy--improving: --Currently on gabapentin 900 mg nightly and 600 mg in the morning --Encouraged to try  water aerobics and stretches.  --Following with Dr. Debbe Bales (ortho) to see if she is a candidate for  steroid injections.  -- Patient evaluated by Dr. Barbaraann Cao thinks that this may be neuropathy worsened by her Velcade therapy.   #Episode of right UE tremor and speech abnormality: --Evaluated by Dr. Barbaraann Cao on 03/20/2023. Presumed etiology had been atrial fibrillation, but vascular disease, atheromatous changes, are also possibly implicated concurrently.  --MRI brain from 03/13/2023 show no acute finding including no evidence of intracranial neoplastic disease. There are chronic small-vessel ischemic changes of the pons, cerebral hemispheric white matter and right cerebellum. --Educated patient to seek immediate evaluate if there are repeat episodes. --No neurological deficits identified per exam today    #Supportive Care -- chemotherapy education complete.  -- port placement not required.  --Awaiting to start Zometa/Xgeva  -- zofran 8mg  q8H PRN and compazine 10mg  PO q6H for nausea -- acyclovir 400mg  PO BID for VCZ prophylaxis -- tylenol 1000 mg q8H PRN for back pain.     Orders Placed This Encounter  Procedures   Multiple Myeloma Panel (SPEP&IFE w/QIG)    Standing Status:   Future    Standing Expiration Date:   04/12/2024   Kappa/lambda light chains    Standing Status:   Future    Standing Expiration Date:   04/12/2024   Multiple Myeloma Panel (SPEP&IFE w/QIG)    Standing Status:   Future    Standing Expiration Date:   04/19/2024   Kappa/lambda light chains    Standing Status:   Future    Standing Expiration Date:   04/19/2024   Multiple Myeloma Panel (SPEP&IFE w/QIG)    Standing Status:   Future    Standing Expiration Date:   05/17/2024   Kappa/lambda light chains    Standing Status:   Future    Standing Expiration Date:   05/17/2024   CMP (Cancer Center only)    Standing Status:   Future    Standing Expiration Date:   05/17/2024   Lactate dehydrogenase (LDH)    Standing Status:    Future    Standing Expiration Date:   05/17/2024   CBC with Differential (Cancer Center Only)    Standing Status:   Future    Standing Expiration Date:   05/17/2024   CMP (Cancer Center only)    Standing Status:   Future    Standing Expiration Date:   05/31/2024   Lactate dehydrogenase (LDH)    Standing Status:   Future    Standing Expiration Date:   05/31/2024   CBC with Differential (Cancer Center Only)    Standing Status:   Future    Standing Expiration Date:   05/31/2024   Multiple Myeloma Panel (SPEP&IFE w/QIG)    Standing Status:   Future    Standing Expiration Date:   06/14/2024   Kappa/lambda light chains    Standing Status:   Future    Standing Expiration Date:   06/14/2024   CMP (Cancer Center only)    Standing Status:   Future    Standing Expiration Date:   06/14/2024   Lactate dehydrogenase (LDH)    Standing Status:   Future    Standing Expiration Date:   06/14/2024   CBC with Differential (Cancer Center Only)    Standing Status:   Future    Standing Expiration Date:   06/14/2024   CMP (Cancer Center only)    Standing Status:   Future    Standing Expiration Date:   06/28/2024   Lactate dehydrogenase (LDH)    Standing Status:   Future    Standing Expiration Date:  06/28/2024   CBC with Differential (Cancer Center Only)    Standing Status:   Future    Standing Expiration Date:   06/28/2024   Ambulatory referral to Pulmonology    Referral Priority:   Routine    Referral Type:   Consultation    Referral Reason:   Specialty Services Required    Requested Specialty:   Pulmonary Disease    Number of Visits Requested:   1    Patient expressed understanding of the plan provided.   I have spent a total of 30 minutes minutes of face-to-face and non-face-to-face time, preparing to see the patient, performing a medically appropriate examination, counseling and educating the patient, ordering tests/procedures, documenting clinical information in the electronic health record, and care  coordination.   Zoe Kaufmann PA-C Dept of Hematology and Oncology [facility] Phone: [phone]

## 2023-04-06 NOTE — Progress Notes (Signed)
Patient and family report she took Decadron at home this AM.

## 2023-04-06 NOTE — Addendum Note (Signed)
Addended by: [name] T on: 04/06/2023 09:43 AM   Modules accepted: Orders

## 2023-04-10 ENCOUNTER — Other Ambulatory Visit: Payer: Medicare Other

## 2023-04-10 ENCOUNTER — Ambulatory Visit: Payer: Medicare Other

## 2023-04-13 ENCOUNTER — Other Ambulatory Visit: Payer: Self-pay

## 2023-04-13 ENCOUNTER — Inpatient Hospital Stay: Payer: Medicare Other

## 2023-04-13 ENCOUNTER — Other Ambulatory Visit: Payer: Self-pay | Admitting: *Deleted

## 2023-04-13 ENCOUNTER — Emergency Department (HOSPITAL_COMMUNITY): Payer: Medicare Other

## 2023-04-13 ENCOUNTER — Encounter (HOSPITAL_COMMUNITY): Payer: Self-pay

## 2023-04-13 ENCOUNTER — Observation Stay (EMERGENCY_DEPARTMENT_HOSPITAL)
Admission: EM | Admit: 2023-04-13 | Discharge: 2023-04-13 | Disposition: A | Discharge status: Home / Self Care | Payer: Medicare Other | Source: Home / Self Care | Attending: Emergency Medicine | Admitting: Emergency Medicine

## 2023-04-13 ENCOUNTER — Inpatient Hospital Stay (HOSPITAL_BASED_OUTPATIENT_CLINIC_OR_DEPARTMENT_OTHER): Payer: Medicare Other | Admitting: Physician Assistant

## 2023-04-13 VITALS — BP 98/61 | HR 96 | Temp 98.8°F | Resp 27

## 2023-04-13 DIAGNOSIS — F419 Anxiety disorder, unspecified: Secondary | ICD-10-CM | POA: Diagnosis present

## 2023-04-13 DIAGNOSIS — E878 Other disorders of electrolyte and fluid balance, not elsewhere classified: Secondary | ICD-10-CM | POA: Diagnosis not present

## 2023-04-13 DIAGNOSIS — Z96653 Presence of artificial knee joint, bilateral: Secondary | ICD-10-CM | POA: Insufficient documentation

## 2023-04-13 DIAGNOSIS — C9000 Multiple myeloma not having achieved remission: Secondary | ICD-10-CM | POA: Diagnosis not present

## 2023-04-13 DIAGNOSIS — I350 Nonrheumatic aortic (valve) stenosis: Secondary | ICD-10-CM | POA: Diagnosis not present

## 2023-04-13 DIAGNOSIS — I5033 Acute on chronic diastolic (congestive) heart failure: Secondary | ICD-10-CM | POA: Insufficient documentation

## 2023-04-13 DIAGNOSIS — Z953 Presence of xenogenic heart valve: Secondary | ICD-10-CM | POA: Diagnosis not present

## 2023-04-13 DIAGNOSIS — R071 Chest pain on breathing: Secondary | ICD-10-CM | POA: Diagnosis not present

## 2023-04-13 DIAGNOSIS — R7989 Other specified abnormal findings of blood chemistry: Secondary | ICD-10-CM | POA: Diagnosis not present

## 2023-04-13 DIAGNOSIS — F32A Depression, unspecified: Secondary | ICD-10-CM | POA: Diagnosis not present

## 2023-04-13 DIAGNOSIS — I4891 Unspecified atrial fibrillation: Secondary | ICD-10-CM | POA: Diagnosis not present

## 2023-04-13 DIAGNOSIS — I499 Cardiac arrhythmia, unspecified: Secondary | ICD-10-CM | POA: Diagnosis not present

## 2023-04-13 DIAGNOSIS — R6889 Other general symptoms and signs: Secondary | ICD-10-CM | POA: Diagnosis not present

## 2023-04-13 DIAGNOSIS — R1312 Dysphagia, oropharyngeal phase: Secondary | ICD-10-CM | POA: Diagnosis not present

## 2023-04-13 DIAGNOSIS — R079 Chest pain, unspecified: Secondary | ICD-10-CM | POA: Insufficient documentation

## 2023-04-13 DIAGNOSIS — M6281 Muscle weakness (generalized): Secondary | ICD-10-CM | POA: Diagnosis not present

## 2023-04-13 DIAGNOSIS — Z1152 Encounter for screening for COVID-19: Secondary | ICD-10-CM | POA: Insufficient documentation

## 2023-04-13 DIAGNOSIS — I083 Combined rheumatic disorders of mitral, aortic and tricuspid valves: Secondary | ICD-10-CM | POA: Diagnosis not present

## 2023-04-13 DIAGNOSIS — R072 Precordial pain: Secondary | ICD-10-CM | POA: Diagnosis not present

## 2023-04-13 DIAGNOSIS — E039 Hypothyroidism, unspecified: Secondary | ICD-10-CM | POA: Insufficient documentation

## 2023-04-13 DIAGNOSIS — Y92009 Unspecified place in unspecified non-institutional (private) residence as the place of occurrence of the external cause: Secondary | ICD-10-CM | POA: Diagnosis not present

## 2023-04-13 DIAGNOSIS — R531 Weakness: Secondary | ICD-10-CM | POA: Diagnosis not present

## 2023-04-13 DIAGNOSIS — R778 Other specified abnormalities of plasma proteins: Secondary | ICD-10-CM | POA: Diagnosis not present

## 2023-04-13 DIAGNOSIS — Z7901 Long term (current) use of anticoagulants: Secondary | ICD-10-CM | POA: Insufficient documentation

## 2023-04-13 DIAGNOSIS — Z743 Need for continuous supervision: Secondary | ICD-10-CM | POA: Diagnosis not present

## 2023-04-13 DIAGNOSIS — Z79899 Other long term (current) drug therapy: Secondary | ICD-10-CM | POA: Insufficient documentation

## 2023-04-13 DIAGNOSIS — N179 Acute kidney failure, unspecified: Secondary | ICD-10-CM | POA: Diagnosis not present

## 2023-04-13 DIAGNOSIS — Z853 Personal history of malignant neoplasm of breast: Secondary | ICD-10-CM | POA: Insufficient documentation

## 2023-04-13 DIAGNOSIS — I700 Atherosclerosis of aorta: Secondary | ICD-10-CM | POA: Diagnosis not present

## 2023-04-13 DIAGNOSIS — R001 Bradycardia, unspecified: Secondary | ICD-10-CM | POA: Diagnosis not present

## 2023-04-13 DIAGNOSIS — I5032 Chronic diastolic (congestive) heart failure: Secondary | ICD-10-CM | POA: Diagnosis not present

## 2023-04-13 DIAGNOSIS — R000 Tachycardia, unspecified: Secondary | ICD-10-CM | POA: Diagnosis not present

## 2023-04-13 DIAGNOSIS — E02 Subclinical iodine-deficiency hypothyroidism: Secondary | ICD-10-CM | POA: Diagnosis not present

## 2023-04-13 DIAGNOSIS — I4819 Other persistent atrial fibrillation: Secondary | ICD-10-CM | POA: Diagnosis not present

## 2023-04-13 DIAGNOSIS — M1990 Unspecified osteoarthritis, unspecified site: Secondary | ICD-10-CM | POA: Diagnosis not present

## 2023-04-13 DIAGNOSIS — I509 Heart failure, unspecified: Secondary | ICD-10-CM | POA: Diagnosis not present

## 2023-04-13 DIAGNOSIS — Z85828 Personal history of other malignant neoplasm of skin: Secondary | ICD-10-CM | POA: Insufficient documentation

## 2023-04-13 DIAGNOSIS — I959 Hypotension, unspecified: Secondary | ICD-10-CM | POA: Diagnosis not present

## 2023-04-13 DIAGNOSIS — I69391 Dysphagia following cerebral infarction: Secondary | ICD-10-CM | POA: Diagnosis not present

## 2023-04-13 DIAGNOSIS — G3184 Mild cognitive impairment, so stated: Secondary | ICD-10-CM | POA: Diagnosis not present

## 2023-04-13 DIAGNOSIS — E871 Hypo-osmolality and hyponatremia: Secondary | ICD-10-CM | POA: Diagnosis not present

## 2023-04-13 DIAGNOSIS — M25551 Pain in right hip: Secondary | ICD-10-CM | POA: Diagnosis not present

## 2023-04-13 DIAGNOSIS — W19XXXA Unspecified fall, initial encounter: Secondary | ICD-10-CM | POA: Diagnosis not present

## 2023-04-13 DIAGNOSIS — K219 Gastro-esophageal reflux disease without esophagitis: Secondary | ICD-10-CM | POA: Diagnosis not present

## 2023-04-13 DIAGNOSIS — I10 Essential (primary) hypertension: Secondary | ICD-10-CM | POA: Diagnosis not present

## 2023-04-13 DIAGNOSIS — I517 Cardiomegaly: Secondary | ICD-10-CM | POA: Diagnosis not present

## 2023-04-13 DIAGNOSIS — Z8673 Personal history of transient ischemic attack (TIA), and cerebral infarction without residual deficits: Secondary | ICD-10-CM | POA: Insufficient documentation

## 2023-04-13 DIAGNOSIS — I480 Paroxysmal atrial fibrillation: Secondary | ICD-10-CM | POA: Diagnosis not present

## 2023-04-13 DIAGNOSIS — I69398 Other sequelae of cerebral infarction: Secondary | ICD-10-CM | POA: Diagnosis not present

## 2023-04-13 DIAGNOSIS — S0990XA Unspecified injury of head, initial encounter: Secondary | ICD-10-CM | POA: Diagnosis not present

## 2023-04-13 DIAGNOSIS — R2689 Other abnormalities of gait and mobility: Secondary | ICD-10-CM | POA: Diagnosis not present

## 2023-04-13 DIAGNOSIS — R296 Repeated falls: Secondary | ICD-10-CM | POA: Diagnosis not present

## 2023-04-13 DIAGNOSIS — I080 Rheumatic disorders of both mitral and aortic valves: Secondary | ICD-10-CM | POA: Diagnosis not present

## 2023-04-13 DIAGNOSIS — I484 Atypical atrial flutter: Secondary | ICD-10-CM | POA: Diagnosis not present

## 2023-04-13 DIAGNOSIS — I110 Hypertensive heart disease with heart failure: Secondary | ICD-10-CM | POA: Insufficient documentation

## 2023-04-13 DIAGNOSIS — I21A1 Myocardial infarction type 2: Secondary | ICD-10-CM | POA: Diagnosis not present

## 2023-04-13 DIAGNOSIS — R0789 Other chest pain: Secondary | ICD-10-CM | POA: Diagnosis not present

## 2023-04-13 DIAGNOSIS — Z952 Presence of prosthetic heart valve: Secondary | ICD-10-CM | POA: Diagnosis not present

## 2023-04-13 DIAGNOSIS — F05 Delirium due to known physiological condition: Secondary | ICD-10-CM | POA: Diagnosis not present

## 2023-04-13 DIAGNOSIS — Z7401 Bed confinement status: Secondary | ICD-10-CM | POA: Diagnosis not present

## 2023-04-13 DIAGNOSIS — E785 Hyperlipidemia, unspecified: Secondary | ICD-10-CM | POA: Diagnosis not present

## 2023-04-13 DIAGNOSIS — J9811 Atelectasis: Secondary | ICD-10-CM | POA: Diagnosis not present

## 2023-04-13 LAB — CMP (CANCER CENTER ONLY)
ALT: 14 U/L (ref 0–44)
AST: 11 U/L — ABNORMAL LOW (ref 15–41)
Albumin: 3.2 g/dL — ABNORMAL LOW (ref 3.5–5.0)
Alkaline Phosphatase: 78 U/L (ref 38–126)
Anion gap: 7 (ref 5–15)
BUN: 17 mg/dL (ref 8–23)
CO2: 30 mmol/L (ref 22–32)
Calcium: 8.6 mg/dL — ABNORMAL LOW (ref 8.9–10.3)
Chloride: 94 mmol/L — ABNORMAL LOW (ref 98–111)
Creatinine: 0.9 mg/dL (ref 0.44–1.00)
GFR, Estimated: >60 mL/min (ref >60)
Glucose, Bld: 133 mg/dL — ABNORMAL HIGH (ref 70–99)
Potassium: 3.7 mmol/L (ref 3.5–5.1)
Sodium: 131 mmol/L — ABNORMAL LOW (ref 135–145)
Total Bilirubin: 1.1 mg/dL (ref 0.3–1.2)
Total Protein: 5.4 g/dL — ABNORMAL LOW (ref 6.5–8.1)

## 2023-04-13 LAB — RESP PANEL BY RT-PCR (RSV, FLU A&B, COVID)  RVPGX2
Influenza A by PCR: NEGATIVE
Influenza B by PCR: NEGATIVE
Resp Syncytial Virus by PCR: NEGATIVE
SARS Coronavirus 2 by RT PCR: NEGATIVE

## 2023-04-13 LAB — CBC WITH DIFFERENTIAL (CANCER CENTER ONLY)
Abs Immature Granulocytes: 0.01 10*3/uL (ref 0.00–0.07)
Basophils Absolute: 0 10*3/uL (ref 0.0–0.1)
Basophils Relative: 1 %
Eosinophils Absolute: 0.2 10*3/uL (ref 0.0–0.5)
Eosinophils Relative: 4 %
HCT: 41.7 % (ref 36.0–46.0)
Hemoglobin: 13.8 g/dL (ref 12.0–15.0)
Immature Granulocytes: 0 %
Lymphocytes Relative: 4 %
Lymphs Abs: 0.2 10*3/uL — ABNORMAL LOW (ref 0.7–4.0)
MCH: 28.5 pg (ref 26.0–34.0)
MCHC: 33.1 g/dL (ref 30.0–36.0)
MCV: 86.2 fL (ref 80.0–100.0)
Monocytes Absolute: 0.8 10*3/uL (ref 0.1–1.0)
Monocytes Relative: 13 %
Neutro Abs: 4.5 10*3/uL (ref 1.7–7.7)
Neutrophils Relative %: 78 %
Platelet Count: 105 10*3/uL — ABNORMAL LOW (ref 150–400)
RBC: 4.84 MIL/uL (ref 3.87–5.11)
RDW: 17.1 % — ABNORMAL HIGH (ref 11.5–15.5)
WBC Count: 5.8 10*3/uL (ref 4.0–10.5)
nRBC: 0 % (ref 0.0–0.2)

## 2023-04-13 LAB — BASIC METABOLIC PANEL
Anion gap: 10 (ref 5–15)
BUN: 17 mg/dL (ref 8–23)
CO2: 25 mmol/L (ref 22–32)
Calcium: 8.2 mg/dL — ABNORMAL LOW (ref 8.9–10.3)
Chloride: 94 mmol/L — ABNORMAL LOW (ref 98–111)
Creatinine, Ser: 0.83 mg/dL (ref 0.44–1.00)
GFR, Estimated: >60 mL/min (ref >60)
Glucose, Bld: 121 mg/dL — ABNORMAL HIGH (ref 70–99)
Potassium: 3.7 mmol/L (ref 3.5–5.1)
Sodium: 129 mmol/L — ABNORMAL LOW (ref 135–145)

## 2023-04-13 LAB — CBC
HCT: 39.9 % (ref 36.0–46.0)
Hemoglobin: 13 g/dL (ref 12.0–15.0)
MCH: 28.3 pg (ref 26.0–34.0)
MCHC: 32.6 g/dL (ref 30.0–36.0)
MCV: 86.9 fL (ref 80.0–100.0)
Platelets: 106 10*3/uL — ABNORMAL LOW (ref 150–400)
RBC: 4.59 MIL/uL (ref 3.87–5.11)
RDW: 17.2 % — ABNORMAL HIGH (ref 11.5–15.5)
WBC: 7.1 10*3/uL (ref 4.0–10.5)
nRBC: 0 % (ref 0.0–0.2)

## 2023-04-13 LAB — TROPONIN I (HIGH SENSITIVITY)
Troponin I (High Sensitivity): 47 ng/L — ABNORMAL HIGH (ref <18)
Troponin I (High Sensitivity): 97 ng/L — ABNORMAL HIGH (ref <18)

## 2023-04-13 LAB — BRAIN NATRIURETIC PEPTIDE: B Natriuretic Peptide: 266.5 pg/mL — ABNORMAL HIGH (ref 0.0–100.0)

## 2023-04-13 MED ORDER — ASPIRIN 81 MG PO CHEW
324.0000 mg | CHEWABLE_TABLET | Freq: Once | ORAL | Status: AC
  Administered 2023-04-13: 324 mg via ORAL
  Filled 2023-04-13: qty 4

## 2023-04-13 MED ORDER — LENALIDOMIDE 25 MG PO CAPS
25.0000 mg | ORAL_CAPSULE | Freq: Every day | ORAL | 0 refills | Status: DC
Start: 2023-04-13 — End: 2023-04-24

## 2023-04-13 NOTE — Progress Notes (Signed)
Symptom Management Consult Note [facility]    Patient Care Team: [facility] as PCP - General (Rheumatology)    Name / MRN / DOB: Zoe [name]  [id number]  1934-07-06   Date of visit: 04/13/2023   Chief Complaint/Reason for visit: chest pain and shortness of breath   Current Therapy: Dara/Rev/Dex   Last treatment:  Day 15   Cycle 2 on 04/06/23   ASSESSMENT & PLAN: Patient is a 89 y.o. female  with oncologic history of IgG Lambda Multiple Myeloma followed by Dr. Leonides Schanz.  I have viewed most recent oncology note and lab work.    #IgG Lambda Multiple Myeloma  - Was here today for labs and treatment. With cardiac symptoms treatment will be canceled.  - Next appointment with oncologist is 04/20/23   #Chest pain -Acute onset with SOB and diaphoresis. -Vitals stable in clinic. EKG showing sinus rhythm -Lung exam with faint expiratory wheeze in right posterior base. No lower extremity edema. -IV placed and basic labs collected, still in process.  -Patient will need ED evaluation for higher level of care. -Report given to accepting ED RN     Heme/Onc History: Oncology History  Multiple myeloma not having achieved remission (HCC)  07/24/2022 Initial Diagnosis   Multiple myeloma not having achieved remission (HCC)   08/01/2022 - 02/12/2023 Chemotherapy   Patient is on Treatment Plan : MYELOMA NON-TRANSPLANT CANDIDATES VRd weekly q21d     02/23/2023 -  Chemotherapy   Patient is on Treatment Plan : MYELOMA RELAPSED REFRACTORY Daratumumab SQ + Lenalidomide + Dexamethasone (DaraRd) q28d         Interval history-: Zoe [name] is a 89 y.o. female with oncologic history as above presenting to [facility] today with chief complaint of chest pain and shortness of breath.  She is accompanied by her neighbor who provides additional history.  Patient reports this morning after taking her shower she felt short of breath.  She describes as  feeling as if she could not catch her breath.  She sat at the table to eat her breakfast of granola cereal with an english muffin and milk and upon finishing had sudden onset of chest tightness located in the center of her chest.  She rates the pain 8 out of 10 in severity.  She reports associated diaphoresis. Pain is constant, not worse with exertion, not pleuritic.  Denies any pain in jaw, arm, back, nausea.  She took her home medications prior to arrival, nothing over-the-counter.  Patient also reports that she had a fall x 2 days ago while working in the community garden.  She was not using her walker and lost her balance.  She states she landed on her backside and hit her head lightly on the grass.  She was evaluated by off-duty EMS who did not feel she needed ED evaluation.  Patient admits to being sore since that fall.  The pain is mostly in her low back and legs.  She denies any palpitation or leg swelling.  Patient has cardiac history including mild coronary spasm by coronary angiogram in 2014, severe AAS status post TAVR in 2019, asymptomatic bilateral carotid artery stenosis, essential hypertension, hyperlipidemia.  Chart review shows patient had hospital admission for chest pain on 4/27.  She was started on metoprolol after being found in rapid A-fib.  No cardiac surgical intervention was needed.  She recently followed up with Dr. Nadara Eaton in office last week. She had an EKG showing normal sinus  rhythm.    ROS  All other systems are reviewed and are negative for acute change except as noted in the HPI.    Allergies  Allergen Reactions   Penicillins Other (See Comments)    UNSPECIFIED REACTION  Patient does not remember reaction.  Has patient had a PCN reaction causing immediate rash, facial/tongue/throat swelling, SOB or lightheadedness with hypotension: no Has patient had a PCN reaction causing severe rash involving mucus membranes or skin necrosis: no Has patient had a PCN reaction that  required hospitalization no Has patient had a PCN reaction occurring within the last 10 years: no If all of the above answers are "NO", then may proceed with Cephalosporin use.    Sulfa Antibiotics Other (See Comments)    UNSPECIFIED REACTION  "maybe vision issues? "     Past Medical History:  Diagnosis Date   Arthritis    some - per patient   Breast cancer (HCC)    breast cancer / left    Cataract    bilat    GERD (gastroesophageal reflux disease)    History of kidney stones    Hyperlipidemia    Hypertension    Hypothyroidism    Macular degeneration    Left   S/P TAVR (transcatheter aortic valve replacement) 09/03/2018   23 mm Edwards Sapien 3 transcatheter heart valve placed via percutaneous right transfemoral approach    Severe aortic stenosis    Stress incontinence    Thyroid disease    Tinnitus      Past Surgical History:  Procedure Laterality Date   ABDOMINAL HYSTERECTOMY  1970's   BACK SURGERY     BREAST LUMPECTOMY  12/1998   lumpectomy   CARDIAC CATHETERIZATION     EYE SURGERY     cataract surgery bilat    INTRAOPERATIVE TRANSTHORACIC ECHOCARDIOGRAM N/A 09/03/2018   Procedure: INTRAOPERATIVE TRANSTHORACIC ECHOCARDIOGRAM;  Surgeon: Kathleene Hazel, MD;  Location: [facility] OR;  Service: Open Heart Surgery;  Laterality: N/A;   KYPHOPLASTY N/A 09/07/2022   Procedure: THORACIC EIGHT KYPHOPLASTY;  Surgeon: Venita Lick, MD;  Location: [facility] OR;  Service: Orthopedics;  Laterality: N/A;  1 hr Local with [facility] 3 C-Bed   LITHOTRIPSY     Right total knee     2018 Dr. Charlann Boxer   RIGHT/LEFT HEART CATH AND CORONARY ANGIOGRAPHY N/A 08/06/2018   Procedure: RIGHT/LEFT HEART CATH AND CORONARY ANGIOGRAPHY;  Surgeon: Yates Decamp, MD;  Location: [facility] INVASIVE CV LAB;  Service: Cardiovascular;  Laterality: N/A;   THYROIDECTOMY, PARTIAL  1975   TONSILLECTOMY     as a child - patient not sure of exact date   TOTAL KNEE ARTHROPLASTY Left 03/13/2016   Procedure: TOTAL KNEE  ARTHROPLASTY;  Surgeon: Durene Romans, MD;  Location: [facility] ORS;  Service: Orthopedics;  Laterality: Left;   TOTAL KNEE ARTHROPLASTY Right 06/18/2017   Procedure: RIGHT TOTAL KNEE ARTHROPLASTY;  Surgeon: Durene Romans, MD;  Location: [facility] ORS;  Service: Orthopedics;  Laterality: Right;   TRANSCATHETER AORTIC VALVE REPLACEMENT, TRANSFEMORAL N/A 09/03/2018   Procedure: TRANSCATHETER AORTIC VALVE REPLACEMENT, TRANSFEMORAL;  Surgeon: Kathleene Hazel, MD;  Location: [facility] OR;  Service: Open Heart Surgery;  Laterality: N/A;    Social History   Socioeconomic History   Marital status: Widowed    Spouse name: Not on file   Number of children: 4   Years of education: Not on file   Highest education level: Master's degree (e.g., MA, MS, MEng, MEd, MSW, MBA)  Occupational History   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  Tobacco Use   Smoking status: Never   Smokeless tobacco: Never  Vaping Use   Vaping Use: Never used  Substance and Sexual Activity   Alcohol use: No   Drug use: No   Sexual activity: Not Currently  Other Topics Concern   Not on file  Social History Narrative   Not on file   Social Determinants of Health   Financial Resource Strain: Low Risk  (11/12/2018)   Overall Financial Resource Strain (CARDIA)    Difficulty of Paying Living Expenses: Not very hard  Food Insecurity: No Food Insecurity (11/12/2018)   Hunger Vital Sign    Worried About Running Out of Food in the Last Year: Never true    Ran Out of Food in the Last Year: Never true  Transportation Needs: No Transportation Needs (11/12/2018)   PRAPARE - Administrator, Civil Service (Medical): No    Lack of Transportation (Non-Medical): No  Physical Activity: Inactive (11/12/2018)   Exercise Vital Sign    Days of Exercise per Week: 0 days    Minutes of Exercise per Session: 0 min  Stress: Stress Concern Present (11/12/2018)   [employer] of Occupational Health - Occupational  Stress Questionnaire    Feeling of Stress : To some extent  Social Connections: Not on file  Intimate Partner Violence: Not on file    Family History  Problem Relation Age of Onset   Diabetes Mother    Stroke Mother        Carotid artery disease   Heart disease Father        CAD   Coronary artery disease Father    Diabetes Sister      Current Outpatient Medications:    acyclovir (ZOVIRAX) 400 MG tablet, Take 1 tablet (400 mg total) by mouth 2 (two) times daily., Disp: 60 tablet, Rfl: 3   amiodarone (PACERONE) 200 MG tablet, Take 0.5 tablets (100 mg total) by mouth daily., Disp: 45 tablet, Rfl: 1   apixaban (ELIQUIS) 5 MG TABS tablet, Take 1 tablet (5 mg total) by mouth 2 (two) times daily., Disp: 180 tablet, Rfl: 1   Artificial Tear Solution (SOOTHE XP) SOLN, Place 1 drop into both eyes every evening., Disp: , Rfl:    benzonatate (TESSALON) 100 MG capsule, TAKE 1 CAPSULE BY MOUTH THREE TIMES A DAY AS NEEDED FOR COUGH, Disp: 20 capsule, Rfl: 0   Cholecalciferol (VITAMIN D3) 50 MCG (2000 UT) capsule, Take 1 capsule (2,000 Units total) by mouth daily., Disp: , Rfl:    [employer]S VITAMIN B12 1000 MCG tablet, TAKE 1 TABLET BY MOUTH EVERY DAY, Disp: 90 tablet, Rfl: 1   dapagliflozin propanediol (FARXIGA) 10 MG TABS tablet, TAKE 1 TABLET BY MOUTH EVERY DAY, Disp: 90 tablet, Rfl: 2   dexamethasone (DECADRON) 4 MG tablet, Take 5 tablets (20 mg total) by mouth once a week. Take 20 mg (5 tablets) on day of multiple myeloma treatment, Disp: 20 tablet, Rfl: 5   esomeprazole (NEXIUM) 20 MG capsule, Take 20 mg by mouth daily as needed (Heartburn)., Disp: , Rfl:    furosemide (LASIX) 40 MG tablet, Take 40 mg by mouth daily as needed for fluid., Disp: , Rfl:    guaiFENesin (MUCINEX) 600 MG 12 hr tablet, Take 600 mg by mouth 2 (two) times daily., Disp: , Rfl:    hydrocortisone cream 1 %, Apply 1 Application topically 2 (two) times daily., Disp: 120 g, Rfl: 0   lenalidomide (  REVLIMID) 25 MG capsule, Take 1  capsule (25 mg total) by mouth daily. Take for 14 days,then none for 7 days.Repeat every 21 days. Auth # [id number]  Date obtained 03/21/23, Disp: 14 capsule, Rfl: 0   levothyroxine (SYNTHROID, LEVOTHROID) 100 MCG tablet, Take 100 mcg by mouth daily before breakfast., Disp: , Rfl: 2   metoprolol succinate (TOPROL-XL) 25 MG 24 hr tablet, Take 1 tablet (25 mg total) by mouth daily., Disp: 30 tablet, Rfl: 2   nystatin (MYCOSTATIN/NYSTOP) powder, Apply 1 Application topically 3 (three) times daily., Disp: 15 g, Rfl: 0   OVER THE COUNTER MEDICATION, Take 1 tablet by mouth daily. AREDS, Disp: , Rfl:    polyethylene glycol (MIRALAX / GLYCOLAX) 17 g packet, Take 17 g by mouth daily as needed for mild constipation., Disp: , Rfl:    potassium chloride (KLOR-CON M10) 10 MEQ tablet, TAKE 2 TABLETS BY MOUTH 2 TIMES DAILY., Disp: 360 tablet, Rfl: 3   pravastatin (PRAVACHOL) 40 MG tablet, Take 40 mg by mouth every evening., Disp: , Rfl:    sennosides-docusate sodium (SENOKOT-S) 8.6-50 MG tablet, Take 1 tablet by mouth daily., Disp: , Rfl:    triamcinolone ointment (KENALOG) 0.5 %, Apply 1 Application topically 2 (two) times daily., Disp: 30 g, Rfl: 0  PHYSICAL EXAM: ECOG FS:1 - Symptomatic but completely ambulatory    Vitals:   04/13/23 0905  BP: 98/61  Pulse: 96  Resp: (!) 27  Temp: 98.8 F (37.1 C)  SpO2: 96%   Physical Exam Vitals and nursing note reviewed.  Constitutional:      Appearance: She is not ill-appearing or toxic-appearing.  HENT:     Head: Normocephalic.  Eyes:     Conjunctiva/sclera: Conjunctivae normal.  Cardiovascular:     Rate and Rhythm: Normal rate and regular rhythm.     Pulses: Normal pulses.     Heart sounds: Murmur heard.  Pulmonary:     Comments: tachypneic Abdominal:     General: There is no distension.  Musculoskeletal:     Cervical back: Normal range of motion.     Right lower leg: No edema.     Left lower leg: No edema.  Skin:    General: Skin is warm and dry.   Neurological:     Mental Status: She is alert.        LABORATORY DATA: I have reviewed the data as listed    Latest Ref Rng & Units 04/06/2023    7:34 AM 03/30/2023    2:15 PM 03/23/2023    8:45 AM  CBC  WBC 4.0 - 10.5 K/uL 5.1  5.7  5.6   Hemoglobin 12.0 - 15.0 g/dL 57.8  46.9  62.9   Hematocrit 36.0 - 46.0 % 41.3  40.5  39.9   Platelets 150 - 400 K/uL 142  115  152         Latest Ref Rng & Units 04/06/2023    7:34 AM 03/30/2023    2:15 PM 03/23/2023    8:45 AM  CMP  Glucose 70 - 99 mg/dL 528  413  244   BUN 8 - 23 mg/dL 14  21  17    Creatinine 0.44 - 1.00 mg/dL 0.10  2.72  5.36   Sodium 135 - 145 mmol/L 131  131  133   Potassium 3.5 - 5.1 mmol/L 4.0  4.5  4.5   Chloride 98 - 111 mmol/L 97  96  96   CO2 22 - 32 mmol/L 26  25  28   Calcium 8.9 - 10.3 mg/dL 8.5  8.4  8.4   Total Protein 6.5 - 8.1 g/dL 5.4  6.1  5.8   Total Bilirubin 0.3 - 1.2 mg/dL 0.5  0.5  0.8   Alkaline Phos 38 - 126 U/L 78  77  74   AST 15 - 41 U/L 11  12  10    ALT 0 - 44 U/L 10  10  8         RADIOGRAPHIC STUDIES (from last 24 hours if applicable) I have personally reviewed the radiological images as listed and agreed with the findings in the report. No results found.      Visit Diagnosis: 1. Chest pain, unspecified type   2. Multiple myeloma not having achieved remission (HCC)      No orders of the defined types were placed in this encounter.   All questions were answered. The patient knows to call the clinic with any problems, questions or concerns. No barriers to learning was detected.  A total of more than 30 minutes were spent on this encounter with face-to-face time and non-face-to-face time, including preparing to see the patient, ordering tests and/or medications, counseling the patient and coordination of care as outlined above.    Thank you for allowing me to participate in the care of this patient.    Shanon Ace, PA-C Department of Hematology/Oncology [facility] at [facility] Phone: [phone]  [phone]    04/13/2023 9:16 AM

## 2023-04-13 NOTE — ED Notes (Signed)
Lauren, PA notified of troponin 97

## 2023-04-13 NOTE — Discharge Instructions (Signed)
**  IMPORTANT DISCHARGE INSTRUCTIONS**   From Dr. Maryfrances Bunnell: You were evaluated for shortness of breath  Here, we found that you had a normal EKG (the electrical activity of the heart) and normal chest x-ray.  Your kidney function, electrolytes and blood counts were all at your baseline.  I discussed with Dr. Jacinto Halim, and we agree that your symptoms are likely a combination of the side effects of your Daratumumab and Revlimid, and maybe some mild fluid overload  For the next three days (Today, tomorrow and Sunday), take your furosemide/Lasix 40 mg once daily  Call Dr. Verl Dicker office today or Monday and ask for a an appointment next week Have them check your kidney function at that appointment   Seek care for palpitations that won't stop, chest pressure or trouble breathing that prevents you from speaking or completing daily activities, obviously swelling in the legs, or if you develop the sensation that you can't breathe when you lay down (but can if you sit up)

## 2023-04-13 NOTE — ED Provider Notes (Signed)
[place] EMERGENCY DEPARTMENT AT [facility] Provider Note   CSN: [id number] Arrival date & time: 04/13/23  6440     History  Chief Complaint  Patient presents with   Chest Pain    Zoe [name] is a 89 y.o. female.  With past medical history of aortic stenosis s/p TAVR, hypertension, diastolic CHF, stroke on Eliquis, multiple myeloma currently on active treatment who presents to the emergency department for chest pain.  Patient states that she was at the cancer center this morning for her appointment and shared that she had felt short of breath and had chest pain this morning and they sent her here for evaluation.  She states that early this morning she was in the shower when she became short of breath or felt like she could not get a full breath.  She also began to have mild pain in her upper center chest that felt like indigestion and pain about 8 out of 10..  She denies having radiation of the pain to her back, jaw, arm.  She states that she is currently asymptomatic but felt she should be evaluated.  At the time she denies having any lightheadedness, dizziness, palpitations, nausea or diaphoresis.  She does states she has had a cough for about 4 to 5 weeks but denies having any fevers..  She notes that she is only to take her Lasix as needed when she feels she is fluid overloaded but has not felt that way recently.  She additionally let me know that she fell 2 days ago while working in the community garden.  She fell onto her bottom but then did strike her head on the grass.  She denies a hard fall.  She denies having any loss of consciousness.  States that her bottom has been sore since then.  She is currently still on chemotherapy injections for her multiple myeloma and sees the oncologist about weekly.   Chest Pain Associated symptoms: shortness of breath        Home Medications Prior to Admission medications   Medication Sig Start Date End Date Taking?  Authorizing Provider  acyclovir (ZOVIRAX) 400 MG tablet Take 1 tablet (400 mg total) by mouth 2 (two) times daily. 03/16/23   Jaci Standard, MD  amiodarone (PACERONE) 200 MG tablet Take 0.5 tablets (100 mg total) by mouth daily. 03/29/23   Yates Decamp, MD  apixaban (ELIQUIS) 5 MG TABS tablet Take 1 tablet (5 mg total) by mouth 2 (two) times daily. 03/01/23 08/28/23  Yates Decamp, MD  Artificial Tear Solution (SOOTHE XP) SOLN Place 1 drop into both eyes every evening.    [provider]  benzonatate (TESSALON) 100 MG capsule TAKE 1 CAPSULE BY MOUTH THREE TIMES A DAY AS NEEDED FOR COUGH 04/03/23   Johney Maine, MD  Cholecalciferol (VITAMIN D3) 50 MCG (2000 UT) capsule Take 1 capsule (2,000 Units total) by mouth daily. 09/11/22   Jaci Standard, MD  [employer] VITAMIN B12 1000 MCG tablet TAKE 1 TABLET BY MOUTH EVERY DAY 02/15/23   [name] T, PA-C  dapagliflozin propanediol (FARXIGA) 10 MG TABS tablet TAKE 1 TABLET BY MOUTH EVERY DAY 10/03/22   Yates Decamp, MD  dexamethasone (DECADRON) 4 MG tablet Take 5 tablets (20 mg total) by mouth once a week. Take 20 mg (5 tablets) on day of multiple myeloma treatment 10/27/22   Jaci Standard, MD  esomeprazole (NEXIUM) 20 MG capsule Take 20 mg by mouth daily  as needed (Heartburn). 12/06/21   [provider]  furosemide (LASIX) 40 MG tablet Take 40 mg by mouth daily as needed for fluid.    [provider]  guaiFENesin (MUCINEX) 600 MG 12 hr tablet Take 600 mg by mouth 2 (two) times daily. 03/08/23   [provider]  hydrocortisone cream 1 % Apply 1 Application topically 2 (two) times daily. 02/13/23   Jaci Standard, MD  lenalidomide (REVLIMID) 25 MG capsule Take 1 capsule (25 mg total) by mouth daily. Take for 14 days,then none for 7 days.Repeat every 21 days. Auth # [id number]  Date obtained 04/13/23 04/13/23   Jaci Standard, MD  levothyroxine (SYNTHROID, LEVOTHROID) 100 MCG tablet Take 100 mcg by mouth daily before breakfast.  12/29/15   [provider]  metoprolol succinate (TOPROL-XL) 25 MG 24 hr tablet Take 1 tablet (25 mg total) by mouth daily. 03/01/23 05/30/23  Yates Decamp, MD  nystatin (MYCOSTATIN/NYSTOP) powder Apply 1 Application topically 3 (three) times daily. 04/06/23   Briant Cedar, PA-C  OVER THE COUNTER MEDICATION Take 1 tablet by mouth daily. AREDS    [provider]  polyethylene glycol (MIRALAX / GLYCOLAX) 17 g packet Take 17 g by mouth daily as needed for mild constipation.    [provider]  potassium chloride (KLOR-CON M10) 10 MEQ tablet TAKE 2 TABLETS BY MOUTH 2 TIMES DAILY. 03/22/23   Yates Decamp, MD  pravastatin (PRAVACHOL) 40 MG tablet Take 40 mg by mouth every evening.    [provider]  sennosides-docusate sodium (SENOKOT-S) 8.6-50 MG tablet Take 1 tablet by mouth daily.    [provider]  triamcinolone ointment (KENALOG) 0.5 % Apply 1 Application topically 2 (two) times daily. 01/22/23   Jaci Standard, MD      Allergies    Penicillins and Sulfa antibiotics    Review of Systems   Review of Systems  Respiratory:  Positive for shortness of breath.   Cardiovascular:  Positive for chest pain.  All other systems reviewed and are negative.   Physical Exam Updated Vital Signs BP (!) 155/67   Pulse 74   Temp 97.7 F (36.5 C) (Oral)   Resp (!) 23   Ht 5\' 3"  (1.6 m)   Wt 70 kg   SpO2 95%   BMI 27.34 kg/m  Physical Exam Vitals and nursing note reviewed.  Constitutional:      General: She is not in acute distress.    Appearance: Normal appearance. She is well-developed. She is not ill-appearing or toxic-appearing.  HENT:     Head: Normocephalic.  Eyes:     General: No scleral icterus. Neck:     Vascular: No JVD.  Cardiovascular:     Rate and Rhythm: Normal rate and regular rhythm.     Pulses:          Radial pulses are 2+ on the right side and 2+ on the left side.     Heart sounds: Murmur heard.     Systolic murmur is present.   Pulmonary:     Effort: Pulmonary effort is normal. No tachypnea or respiratory distress.     Breath sounds: Normal breath sounds. No decreased breath sounds.  Chest:     Chest wall: No tenderness.  Abdominal:     General: Bowel sounds are normal.     Palpations: Abdomen is soft.  Musculoskeletal:        General: Normal range of motion.  Lymphadenopathy:  Comments: Trace bilateral lower extremity edema  Skin:    General: Skin is warm and dry.     Capillary Refill: Capillary refill takes less than 2 seconds.  Neurological:     General: No focal deficit present.     Mental Status: She is alert and oriented to person, place, and time.  Psychiatric:        Mood and Affect: Mood normal.        Behavior: Behavior normal.     ED Results / Procedures / Treatments   Labs (all labs ordered are listed, but only abnormal results are displayed) Labs Reviewed  BASIC METABOLIC PANEL - Abnormal; Notable for the following components:      Result Value   Sodium 129 (*)    Chloride 94 (*)    Glucose, Bld 121 (*)    Calcium 8.2 (*)    All other components within normal limits  CBC - Abnormal; Notable for the following components:   RDW 17.2 (*)    Platelets 106 (*)    All other components within normal limits  BRAIN NATRIURETIC PEPTIDE - Abnormal; Notable for the following components:   B Natriuretic Peptide 266.5 (*)    All other components within normal limits  TROPONIN I (HIGH SENSITIVITY) - Abnormal; Notable for the following components:   Troponin I (High Sensitivity) 47 (*)    All other components within normal limits  TROPONIN I (HIGH SENSITIVITY) - Abnormal; Notable for the following components:   Troponin I (High Sensitivity) 97 (*)    All other components within normal limits  RESP PANEL BY RT-PCR (RSV, FLU A&B, COVID)  [id number]    EKG EKG Interpretation  Date/Time:  Friday April 13 2023 09:35:28 EDT Ventricular Rate:  84 PR Interval:  188 QRS Duration: 93 QT  Interval:  381 QTC Calculation: 451 R Axis:   43 Text Interpretation: Sinus rhythm Probable left atrial enlargement No significant change since last tracing Confirmed by Alvira Monday ([id number]) on 04/13/2023 1:19:04 PM  Radiology CT Head Wo Contrast  Result Date: 04/13/2023 CLINICAL DATA:  Head trauma.  Status post fall. EXAM: CT HEAD WITHOUT CONTRAST TECHNIQUE: Contiguous axial images were obtained from the base of the skull through the vertex without intravenous contrast. RADIATION DOSE REDUCTION: This exam was performed according to the departmental dose-optimization program which includes automated exposure control, adjustment of the mA and/or kV according to patient size and/or use of iterative reconstruction technique. COMPARISON:  MRI 03/13/2023 FINDINGS: Brain: No evidence of acute infarction, hemorrhage, hydrocephalus, extra-axial collection or mass lesion/mass effect. There is patchy low-attenuation within the subcortical and periventricular white matter compatible with chronic microvascular disease. Vascular: No hyperdense vessel or unexpected calcification. Skull: Normal. Negative for fracture or focal lesion. Sinuses/Orbits: Opacification of the sphenoid sinus. Postsurgical changes from ethmoidectomy. Bilateral median antrectomy. Mastoid air cells are clear. Other: No significant scalp hematoma. IMPRESSION: 1. No acute intracranial abnormalities. 2. Chronic microvascular disease 3. Chronic sinus disease. Electronically Signed   By: Signa Kell M.D.   On: 04/13/2023 11:43   DG Chest 2 View  Result Date: 04/13/2023 CLINICAL DATA:  Chest pain.  Multiple myeloma. EXAM: CHEST - 2 VIEW COMPARISON:  Radiographs 03/23/2023 and 02/17/2023. FINDINGS: Stable cardiomegaly and aortic atherosclerosis post TAVR procedure. There is mild atelectasis or scarring at both lung bases which appears unchanged. Ill-defined density projecting over the right lung apex on the frontal examination has no definite  correlate on the lateral view and may relate to overlying soft  tissues. No focal airspace disease, pneumothorax or significant pleural effusion identified. The bones appear unchanged status post 2 level spinal augmentation. Telemetry leads overlie the chest. IMPRESSION: No definite acute cardiopulmonary process identified. Stable cardiomegaly and mild bibasilar atelectasis or scarring. Electronically Signed   By: Carey Bullocks M.D.   On: 04/13/2023 10:25    Procedures Procedures   Medications Ordered in ED Medications  aspirin chewable tablet 324 mg (324 mg Oral Given 04/13/23 1326)    ED Course/ Medical Decision Making/ A&P            HEART Score: 6                Medical Decision Making Amount and/or Complexity of Data Reviewed Labs: ordered. Radiology: ordered.  Risk OTC drugs. Decision regarding hospitalization.  Initial Impression and Ddx 89 year old female who presents to the emergency department with chest pain. Patient PMH that increases complexity of ED encounter: Multiple myeloma on active treatment, aortic stenosis s/p TAVR, hypertension, CHF  Interpretation of Diagnostics I independent reviewed and interpreted the labs as followed: Troponin 47, 97.  BNP 266, stable.  BNP with sodium of 129, chloride 94 question dehydration.  CBC within normal limits.  COVID/flu is negative  - I independently visualized the following imaging with scope of interpretation limited to determining acute life threatening conditions related to emergency care: CT head negative, chest x-ray with stable bibasilar atelectasis  Patient Reassessment and Ultimate Disposition/Management 89 year old female who presents to the emergency department with chest pain.  She is overall well-appearing, nonseptic and nontoxic in appearance.  She is currently not having any symptoms.  Will perform ACS workup.  Additionally she fell 2 days ago and struck her head and she is anticoagulated so we will obtain a CT  head at this time.  Will also obtain a BNP given that she has CHF.  EKG without obvious ischemia or infarction. Chest x-ray with stable findings. CT head with no acute bleed from fall 2 days ago.  Initial troponin is 47, appears that she has chronic elevation in the 20s.  Will wait for delta.  Her BMP shows a mild hyponatremia and hypochloremia.  Question dehydration.  Does not clinically appear to be dehydrated on my exam.  COVID and flu is negative.  No leukocytosis.  Delta troponin is 97, delta +30.  Ordering aspirin.  I have consulted and spoke with cardiology who recommends hospitalist admission.  Consulted and spoke with Dr. Maryfrances Bunnell, hospitalist who agrees to admit the patient.  This may be NSTEMI/ACS.  Blood pressure is currently under control.  I do not think that this is CHF exacerbation causing demand ischemia.  Considered and doubt acute PE.  Do not feel that she needs D-dimer as will likely be elevated anyway from her known cancer.  She is not tachycardic, tachypneic, hypoxic.  History is not consistent with PE.  Will not scan at this time.  No evidence of pneumonia or pneumothorax or large pleural effusion.  Admitting for ongoing cardiac workup.  Patient is agreeable to plan.   Patient management required discussion with the following services or consulting groups:  Hospitalist Service and Cardiology  Complexity of Problems Addressed Acute illness or injury that poses threat of life of bodily function  Additional Data Reviewed and Analyzed Further history obtained from: Further history from spouse/family member, Past medical history and medications listed in the EMR, Recent PCP notes, and Care Everywhere  Patient Encounter Risk Assessment Consideration of hospitalization  Final Clinical Impression(s) /  ED Diagnoses Final diagnoses:  Chest pain, unspecified type    Rx / DC Orders ED Discharge Orders     None         Cristopher Peru, PA-C 04/13/23 1346     Alvira Monday, MD 04/14/23 [phone]

## 2023-04-13 NOTE — Consult Note (Signed)
Initial Consultation Note   Patient: Zoe [name] ZDG:[id number] DOB: 1934-02-02 PCP: [facility] DOA: 04/13/2023 DOS: the patient was seen and examined on 04/13/2023 Primary service: Alberteen Sam, *  Referring physician: Alvira Monday Reason for consult: Shortness of breath and elevated troponin  Assessment/Plan: Shortness of breath Mild acute on chronic diastolic Congestive heart failure Myocardial injury due to CHF Discussed with Dr. Jacinto Halim.  In setting of her normal coronary angiogram a few years ago, normal ECG and with no clinically significant delta troponin, ACS is ruled out.  Given adherence to Eliquis, PE is very unlikely.  Pericarditis and cardiomyopathy are unlikely.  She likely has dyspnea as a side effect of chemo, possibly with a very mild CHF exacerbation  - Take furosemide 40 mg next 3 days - Follow up next week with Dr. Jacinto Halim for BMP and clinical eval - Defer echo to Cardiology        HPI: Zoe [name] is a 89 y.o. female with history pAF on Eliquis, MM on Darutumumab, Revlimid, chronic hyponatremia, dCHF, hx TAVR, and hypothyroidism who presented with shortness of breath for 1 day.  Patient started darutumumab 2 months ago.  Since then, family have noticed some apparent shortness of breath.  Patient noticed nothing until this morning, getting out of the shower, she was short of breath and had vague chest discomfort in the upper chest.  No orthopnea, no leg swelling.  No exertional symptoms.  No palpitations.    Went for her routine chemo today, and reported shortness of breath to [facility] staff, who routed her to the ER.  In the ER, CXR clear.  ECG showed no ST changes. Troponin 47 -> 97.  BNP 250.  COVID negative.  Renal function and Hgb stable/normal.       Review of Systems  Constitutional:  Negative for chills, fever and malaise/fatigue.  Respiratory:  Positive for shortness of breath. Negative for cough,  hemoptysis, sputum production and wheezing.   Cardiovascular:  Positive for chest pain. Negative for palpitations, orthopnea, claudication, leg swelling and PND.  All other systems reviewed and are negative.   Past Medical History:  Diagnosis Date   Arthritis    some - per patient   Breast cancer (HCC)    breast cancer / left    Cataract    bilat    GERD (gastroesophageal reflux disease)    History of kidney stones    Hyperlipidemia    Hypertension    Hypothyroidism    Macular degeneration    Left   S/P TAVR (transcatheter aortic valve replacement) 09/03/2018   23 mm Edwards Sapien 3 transcatheter heart valve placed via percutaneous right transfemoral approach    Severe aortic stenosis    Stress incontinence    Thyroid disease    Tinnitus    Past Surgical History:  Procedure Laterality Date   ABDOMINAL HYSTERECTOMY  1970's   BACK SURGERY     BREAST LUMPECTOMY  12/1998   lumpectomy   CARDIAC CATHETERIZATION     EYE SURGERY     cataract surgery bilat    INTRAOPERATIVE TRANSTHORACIC ECHOCARDIOGRAM N/A 09/03/2018   Procedure: INTRAOPERATIVE TRANSTHORACIC ECHOCARDIOGRAM;  Surgeon: Kathleene Hazel, MD;  Location: [facility] OR;  Service: Open Heart Surgery;  Laterality: N/A;   KYPHOPLASTY N/A 09/07/2022   Procedure: THORACIC EIGHT KYPHOPLASTY;  Surgeon: Venita Lick, MD;  Location: [facility] OR;  Service: Orthopedics;  Laterality: N/A;  1 hr Local with [facility] 3 C-Bed   LITHOTRIPSY  Right total knee     2018 Dr. Charlann Boxer   RIGHT/LEFT HEART CATH AND CORONARY ANGIOGRAPHY N/A 08/06/2018   Procedure: RIGHT/LEFT HEART CATH AND CORONARY ANGIOGRAPHY;  Surgeon: Yates Decamp, MD;  Location: [facility] INVASIVE CV LAB;  Service: Cardiovascular;  Laterality: N/A;   THYROIDECTOMY, PARTIAL  1975   TONSILLECTOMY     as a child - patient not sure of exact date   TOTAL KNEE ARTHROPLASTY Left 03/13/2016   Procedure: TOTAL KNEE ARTHROPLASTY;  Surgeon: Durene Romans, MD;  Location: [facility] ORS;  Service:  Orthopedics;  Laterality: Left;   TOTAL KNEE ARTHROPLASTY Right 06/18/2017   Procedure: RIGHT TOTAL KNEE ARTHROPLASTY;  Surgeon: Durene Romans, MD;  Location: [facility] ORS;  Service: Orthopedics;  Laterality: Right;   TRANSCATHETER AORTIC VALVE REPLACEMENT, TRANSFEMORAL N/A 09/03/2018   Procedure: TRANSCATHETER AORTIC VALVE REPLACEMENT, TRANSFEMORAL;  Surgeon: Kathleene Hazel, MD;  Location: [facility] OR;  Service: Open Heart Surgery;  Laterality: N/A;   Social History:  reports that she has never smoked. She has never used smokeless tobacco. She reports that she does not drink alcohol and does not use drugs.  Allergies  Allergen Reactions   Penicillins Other (See Comments)    UNSPECIFIED REACTION  Patient does not remember reaction.  Has patient had a PCN reaction causing immediate rash, facial/tongue/throat swelling, SOB or lightheadedness with hypotension: no Has patient had a PCN reaction causing severe rash involving mucus membranes or skin necrosis: no Has patient had a PCN reaction that required hospitalization no Has patient had a PCN reaction occurring within the last 10 years: no If all of the above answers are "NO", then may proceed with Cephalosporin use.    Sulfa Antibiotics Other (See Comments)    UNSPECIFIED REACTION  "maybe vision issues? "    Family History  Problem Relation Age of Onset   Diabetes Mother    Stroke Mother        Carotid artery disease   Heart disease Father        CAD   Coronary artery disease Father    Diabetes Sister     Prior to Admission medications   Medication Sig Start Date End Date Taking? Authorizing Provider  acyclovir (ZOVIRAX) 400 MG tablet Take 1 tablet (400 mg total) by mouth 2 (two) times daily. 03/16/23   Jaci Standard, MD  amiodarone (PACERONE) 200 MG tablet Take 0.5 tablets (100 mg total) by mouth daily. 03/29/23   Yates Decamp, MD  apixaban (ELIQUIS) 5 MG TABS tablet Take 1 tablet (5 mg total) by mouth 2 (two) times daily. 03/01/23  08/28/23  Yates Decamp, MD  Artificial Tear Solution (SOOTHE XP) SOLN Place 1 drop into both eyes every evening.    [provider]  benzonatate (TESSALON) 100 MG capsule TAKE 1 CAPSULE BY MOUTH THREE TIMES A DAY AS NEEDED FOR COUGH 04/03/23   Johney Maine, MD  Cholecalciferol (VITAMIN D3) 50 MCG (2000 UT) capsule Take 1 capsule (2,000 Units total) by mouth daily. 09/11/22   Jaci Standard, MD  [employer] VITAMIN B12 1000 MCG tablet TAKE 1 TABLET BY MOUTH EVERY DAY 02/15/23   [name] T, PA-C  dapagliflozin propanediol (FARXIGA) 10 MG TABS tablet TAKE 1 TABLET BY MOUTH EVERY DAY 10/03/22   Yates Decamp, MD  dexamethasone (DECADRON) 4 MG tablet Take 5 tablets (20 mg total) by mouth once a week. Take 20 mg (5 tablets) on day of multiple myeloma treatment 10/27/22   Jaci Standard, MD  esomeprazole (NEXIUM) 20 MG capsule Take 20 mg by mouth daily as needed (Heartburn). 12/06/21   [provider]  furosemide (LASIX) 40 MG tablet Take 40 mg by mouth daily as needed for fluid.    [provider]  guaiFENesin (MUCINEX) 600 MG 12 hr tablet Take 600 mg by mouth 2 (two) times daily. 03/08/23   [provider]  hydrocortisone cream 1 % Apply 1 Application topically 2 (two) times daily. 02/13/23   Jaci Standard, MD  lenalidomide (REVLIMID) 25 MG capsule Take 1 capsule (25 mg total) by mouth daily. Take for 14 days,then none for 7 days.Repeat every 21 days. Auth # [id number]  Date obtained 04/13/23 04/13/23   Jaci Standard, MD  levothyroxine (SYNTHROID, LEVOTHROID) 100 MCG tablet Take 100 mcg by mouth daily before breakfast. 12/29/15   [provider]  metoprolol succinate (TOPROL-XL) 25 MG 24 hr tablet Take 1 tablet (25 mg total) by mouth daily. 03/01/23 05/30/23  Yates Decamp, MD  nystatin (MYCOSTATIN/NYSTOP) powder Apply 1 Application topically 3 (three) times daily. 04/06/23   Briant Cedar, PA-C  OVER THE COUNTER MEDICATION Take 1 tablet by mouth daily. AREDS     [provider]  polyethylene glycol (MIRALAX / GLYCOLAX) 17 g packet Take 17 g by mouth daily as needed for mild constipation.    [provider]  potassium chloride (KLOR-CON M10) 10 MEQ tablet TAKE 2 TABLETS BY MOUTH 2 TIMES DAILY. 03/22/23   Yates Decamp, MD  pravastatin (PRAVACHOL) 40 MG tablet Take 40 mg by mouth every evening.    [provider]  sennosides-docusate sodium (SENOKOT-S) 8.6-50 MG tablet Take 1 tablet by mouth daily.    [provider]  triamcinolone ointment (KENALOG) 0.5 % Apply 1 Application topically 2 (two) times daily. 01/22/23   Jaci Standard, MD    Physical Exam: Vitals:   04/13/23 1300 04/13/23 1315 04/13/23 1330 04/13/23 1345  BP: (!) 144/69     Pulse: 72 71 75 75  Resp: 17 17 19 13   Temp:      TempSrc:      SpO2: 97% 95% 96% 98%  Weight:      Height:       Elderly adult female, lying in bed, no acute distress RRR, no murmurs, no peripheral edema, no rubs  Respiratory rate normal, lungs clear without rales or wheezes Abdomen soft without tenderness palpation or guarding, no ascites or distention Attention normal, affect normal, judgment insight appear normal     Data Reviewed:  Basic metabolic panel shows mild hyponatremia, stable from baseline, normal renal function LFTs normal CBC shows mild thrombocytopenia, normal hemoglobin Chest x-ray, personally reviewed, clear CT head unremarkable High-sensitivity troponin, minimally elevated and flat Brain atretic peptide, 260 COVID-negative EKG, personally reviewed, normal sinus rhythm Discussed with Dr. Jacinto Halim     Family Communication: Daughter at bedside  Author: Alberteen Sam, MD 04/13/2023 3:12 PM  For on call review [email or web address].

## 2023-04-13 NOTE — ED Triage Notes (Signed)
Patient brought over from the cancer center. Was supposed to have treatment today for multiple myeloma. Woke up this morning at 630am with shortness of breath and chest tightness. Shortness of breath worsens with ambulation. Had a fall 2 days ago after losing her balance and is on Eloquis. Did her her head.

## 2023-04-15 ENCOUNTER — Encounter (HOSPITAL_COMMUNITY): Payer: Self-pay

## 2023-04-15 ENCOUNTER — Telehealth: Payer: Self-pay | Admitting: Cardiology

## 2023-04-15 ENCOUNTER — Other Ambulatory Visit: Payer: Self-pay

## 2023-04-15 ENCOUNTER — Inpatient Hospital Stay (HOSPITAL_COMMUNITY)
Admission: EM | Admit: 2023-04-15 | Discharge: 2023-04-24 | DRG: 308 | Disposition: A | Discharge status: Skilled Nursing Facility | Payer: Medicare Other | Attending: Internal Medicine | Admitting: Internal Medicine

## 2023-04-15 ENCOUNTER — Emergency Department (HOSPITAL_COMMUNITY): Payer: Medicare Other

## 2023-04-15 DIAGNOSIS — I5032 Chronic diastolic (congestive) heart failure: Secondary | ICD-10-CM | POA: Diagnosis present

## 2023-04-15 DIAGNOSIS — Z79899 Other long term (current) drug therapy: Secondary | ICD-10-CM

## 2023-04-15 DIAGNOSIS — I10 Essential (primary) hypertension: Secondary | ICD-10-CM | POA: Diagnosis present

## 2023-04-15 DIAGNOSIS — I4891 Unspecified atrial fibrillation: Secondary | ICD-10-CM | POA: Diagnosis not present

## 2023-04-15 DIAGNOSIS — Z823 Family history of stroke: Secondary | ICD-10-CM

## 2023-04-15 DIAGNOSIS — R079 Chest pain, unspecified: Secondary | ICD-10-CM | POA: Diagnosis not present

## 2023-04-15 DIAGNOSIS — I484 Atypical atrial flutter: Secondary | ICD-10-CM

## 2023-04-15 DIAGNOSIS — Z90710 Acquired absence of both cervix and uterus: Secondary | ICD-10-CM

## 2023-04-15 DIAGNOSIS — Z79890 Hormone replacement therapy (postmenopausal): Secondary | ICD-10-CM

## 2023-04-15 DIAGNOSIS — Y92009 Unspecified place in unspecified non-institutional (private) residence as the place of occurrence of the external cause: Secondary | ICD-10-CM | POA: Diagnosis not present

## 2023-04-15 DIAGNOSIS — E871 Hypo-osmolality and hyponatremia: Secondary | ICD-10-CM | POA: Diagnosis not present

## 2023-04-15 DIAGNOSIS — R072 Precordial pain: Secondary | ICD-10-CM | POA: Diagnosis not present

## 2023-04-15 DIAGNOSIS — Z743 Need for continuous supervision: Secondary | ICD-10-CM | POA: Diagnosis not present

## 2023-04-15 DIAGNOSIS — E876 Hypokalemia: Secondary | ICD-10-CM | POA: Diagnosis present

## 2023-04-15 DIAGNOSIS — G3184 Mild cognitive impairment, so stated: Secondary | ICD-10-CM | POA: Diagnosis present

## 2023-04-15 DIAGNOSIS — I5033 Acute on chronic diastolic (congestive) heart failure: Secondary | ICD-10-CM | POA: Diagnosis present

## 2023-04-15 DIAGNOSIS — R7989 Other specified abnormal findings of blood chemistry: Secondary | ICD-10-CM | POA: Diagnosis not present

## 2023-04-15 DIAGNOSIS — R6889 Other general symptoms and signs: Secondary | ICD-10-CM | POA: Diagnosis not present

## 2023-04-15 DIAGNOSIS — Z96653 Presence of artificial knee joint, bilateral: Secondary | ICD-10-CM | POA: Diagnosis present

## 2023-04-15 DIAGNOSIS — F32A Depression, unspecified: Secondary | ICD-10-CM | POA: Diagnosis present

## 2023-04-15 DIAGNOSIS — E785 Hyperlipidemia, unspecified: Secondary | ICD-10-CM | POA: Diagnosis present

## 2023-04-15 DIAGNOSIS — C9000 Multiple myeloma not having achieved remission: Secondary | ICD-10-CM | POA: Diagnosis not present

## 2023-04-15 DIAGNOSIS — I499 Cardiac arrhythmia, unspecified: Secondary | ICD-10-CM | POA: Diagnosis not present

## 2023-04-15 DIAGNOSIS — R001 Bradycardia, unspecified: Secondary | ICD-10-CM | POA: Diagnosis not present

## 2023-04-15 DIAGNOSIS — Z853 Personal history of malignant neoplasm of breast: Secondary | ICD-10-CM

## 2023-04-15 DIAGNOSIS — I110 Hypertensive heart disease with heart failure: Secondary | ICD-10-CM | POA: Diagnosis present

## 2023-04-15 DIAGNOSIS — W19XXXA Unspecified fall, initial encounter: Secondary | ICD-10-CM | POA: Diagnosis not present

## 2023-04-15 DIAGNOSIS — R778 Other specified abnormalities of plasma proteins: Secondary | ICD-10-CM | POA: Diagnosis not present

## 2023-04-15 DIAGNOSIS — K219 Gastro-esophageal reflux disease without esophagitis: Secondary | ICD-10-CM | POA: Diagnosis present

## 2023-04-15 DIAGNOSIS — I480 Paroxysmal atrial fibrillation: Secondary | ICD-10-CM | POA: Diagnosis not present

## 2023-04-15 DIAGNOSIS — F05 Delirium due to known physiological condition: Secondary | ICD-10-CM | POA: Diagnosis not present

## 2023-04-15 DIAGNOSIS — E039 Hypothyroidism, unspecified: Secondary | ICD-10-CM | POA: Diagnosis present

## 2023-04-15 DIAGNOSIS — M1990 Unspecified osteoarthritis, unspecified site: Secondary | ICD-10-CM | POA: Diagnosis present

## 2023-04-15 DIAGNOSIS — Z952 Presence of prosthetic heart valve: Secondary | ICD-10-CM

## 2023-04-15 DIAGNOSIS — Z880 Allergy status to penicillin: Secondary | ICD-10-CM

## 2023-04-15 DIAGNOSIS — Z7901 Long term (current) use of anticoagulants: Secondary | ICD-10-CM

## 2023-04-15 DIAGNOSIS — Z882 Allergy status to sulfonamides status: Secondary | ICD-10-CM

## 2023-04-15 DIAGNOSIS — F419 Anxiety disorder, unspecified: Secondary | ICD-10-CM | POA: Diagnosis present

## 2023-04-15 DIAGNOSIS — Z8673 Personal history of transient ischemic attack (TIA), and cerebral infarction without residual deficits: Secondary | ICD-10-CM

## 2023-04-15 DIAGNOSIS — R296 Repeated falls: Secondary | ICD-10-CM | POA: Diagnosis present

## 2023-04-15 DIAGNOSIS — J9811 Atelectasis: Secondary | ICD-10-CM | POA: Diagnosis present

## 2023-04-15 DIAGNOSIS — Z1152 Encounter for screening for COVID-19: Secondary | ICD-10-CM

## 2023-04-15 DIAGNOSIS — I959 Hypotension, unspecified: Secondary | ICD-10-CM | POA: Diagnosis not present

## 2023-04-15 DIAGNOSIS — Z9221 Personal history of antineoplastic chemotherapy: Secondary | ICD-10-CM

## 2023-04-15 DIAGNOSIS — N179 Acute kidney failure, unspecified: Secondary | ICD-10-CM | POA: Diagnosis not present

## 2023-04-15 DIAGNOSIS — Z8249 Family history of ischemic heart disease and other diseases of the circulatory system: Secondary | ICD-10-CM

## 2023-04-15 DIAGNOSIS — I4819 Other persistent atrial fibrillation: Principal | ICD-10-CM | POA: Diagnosis present

## 2023-04-15 DIAGNOSIS — E878 Other disorders of electrolyte and fluid balance, not elsewhere classified: Secondary | ICD-10-CM | POA: Diagnosis present

## 2023-04-15 DIAGNOSIS — Z953 Presence of xenogenic heart valve: Secondary | ICD-10-CM

## 2023-04-15 LAB — CBC WITH DIFFERENTIAL/PLATELET
Abs Immature Granulocytes: 0.06 10*3/uL (ref 0.00–0.07)
Basophils Absolute: 0.1 10*3/uL (ref 0.0–0.1)
Basophils Relative: 1 %
Eosinophils Absolute: 0.2 10*3/uL (ref 0.0–0.5)
Eosinophils Relative: 1 %
HCT: 41.2 % (ref 36.0–46.0)
Hemoglobin: 13.2 g/dL (ref 12.0–15.0)
Immature Granulocytes: 1 %
Lymphocytes Relative: 6 %
Lymphs Abs: 0.6 10*3/uL — ABNORMAL LOW (ref 0.7–4.0)
MCH: 28.6 pg (ref 26.0–34.0)
MCHC: 32 g/dL (ref 30.0–36.0)
MCV: 89.4 fL (ref 80.0–100.0)
Monocytes Absolute: 2.2 10*3/uL — ABNORMAL HIGH (ref 0.1–1.0)
Monocytes Relative: 21 %
Neutro Abs: 7.4 10*3/uL (ref 1.7–7.7)
Neutrophils Relative %: 70 %
Platelets: 169 10*3/uL (ref 150–400)
RBC: 4.61 MIL/uL (ref 3.87–5.11)
RDW: 17.6 % — ABNORMAL HIGH (ref 11.5–15.5)
WBC: 10.5 10*3/uL (ref 4.0–10.5)
nRBC: 0 % (ref 0.0–0.2)

## 2023-04-15 LAB — CBC
HCT: 41.2 % (ref 36.0–46.0)
Hemoglobin: 13.4 g/dL (ref 12.0–15.0)
MCH: 28 pg (ref 26.0–34.0)
MCHC: 32.5 g/dL (ref 30.0–36.0)
MCV: 86.2 fL (ref 80.0–100.0)
Platelets: 184 10*3/uL (ref 150–400)
RBC: 4.78 MIL/uL (ref 3.87–5.11)
RDW: 17.6 % — ABNORMAL HIGH (ref 11.5–15.5)
WBC: 9.8 10*3/uL (ref 4.0–10.5)
nRBC: 0 % (ref 0.0–0.2)

## 2023-04-15 LAB — OSMOLALITY: Osmolality: 285 mOsm/kg (ref 275–295)

## 2023-04-15 LAB — TROPONIN I (HIGH SENSITIVITY)
Troponin I (High Sensitivity): 263 ng/L (ref <18)
Troponin I (High Sensitivity): 255 ng/L (ref <18)

## 2023-04-15 LAB — BASIC METABOLIC PANEL
Anion gap: 15 (ref 5–15)
BUN: 33 mg/dL — ABNORMAL HIGH (ref 8–23)
CO2: 25 mmol/L (ref 22–32)
Calcium: 7.9 mg/dL — ABNORMAL LOW (ref 8.9–10.3)
Chloride: 93 mmol/L — ABNORMAL LOW (ref 98–111)
Creatinine, Ser: 1 mg/dL (ref 0.44–1.00)
GFR, Estimated: 55 mL/min — ABNORMAL LOW (ref >60)
Glucose, Bld: 109 mg/dL — ABNORMAL HIGH (ref 70–99)
Potassium: 3.5 mmol/L (ref 3.5–5.1)
Sodium: 133 mmol/L — ABNORMAL LOW (ref 135–145)

## 2023-04-15 LAB — LACTIC ACID, PLASMA: Lactic Acid, Venous: 1.6 mmol/L (ref 0.5–1.9)

## 2023-04-15 LAB — CBG MONITORING, ED: Glucose-Capillary: 111 mg/dL — ABNORMAL HIGH (ref 70–99)

## 2023-04-15 MED ORDER — SODIUM CHLORIDE 0.9 % IV SOLN
1000.0000 mL | INTRAVENOUS | Status: DC
  Administered 2023-04-15: 1000 mL via INTRAVENOUS

## 2023-04-15 MED ORDER — SODIUM CHLORIDE 0.9 % IV BOLUS (SEPSIS)
500.0000 mL | Freq: Once | INTRAVENOUS | Status: AC
  Administered 2023-04-15: 500 mL via INTRAVENOUS

## 2023-04-15 MED ORDER — ASPIRIN 81 MG PO CHEW
324.0000 mg | CHEWABLE_TABLET | Freq: Once | ORAL | Status: AC
  Administered 2023-04-15: 324 mg via ORAL
  Filled 2023-04-15: qty 4

## 2023-04-15 NOTE — Subjective & Objective (Signed)
Patient today was noted to have hypotension and tachycardia blood pressure was checked by the neighbor was 90/52 and heart rates going up to 155.  She noted that her heart rate on her iWatch was alerting her for A-fib no associated chest pain Since patient was anticoagulated cardioversion was attempted by EMS she was given 200 J of cardioversion Patient endorses chest discomfort and shortness of breath has been ongoing for the past few days Has been compliant with her amiodarone and Eliquis

## 2023-04-15 NOTE — Assessment & Plan Note (Signed)
Mild stable will order your electrolytes

## 2023-04-15 NOTE — Assessment & Plan Note (Signed)
Chronic-stable.

## 2023-04-15 NOTE — ED Triage Notes (Signed)
Pt BIB EMS due to afib RVR and hypotension. Pt having chest pain since and and SHOB. EMS states BP was 85 systolic, HR 167. Axox4. EMS gave 2.5 of versed and 200 [address] for casrdioverrsion.

## 2023-04-15 NOTE — ED Notes (Signed)
Nurse called lab to follow up blood tests results .

## 2023-04-15 NOTE — ED Provider Notes (Signed)
[facility] EMERGENCY DEPARTMENT AT [facility] Provider Note   CSN: [id number] Arrival date & time: 04/15/23  1751     History  Chief Complaint  Patient presents with   Atrial Fibrillation    Zoe [name] is a 89 y.o. female.   Atrial Fibrillation     Patient has a history of thyroid disease, reflux, hypertension, paroxysmal atrial fibrillation, breast cancer, multiple myeloma aortic stenosis, TAVR presents to the ED for evaluation of tacky dysrhythmia.  Patient started having trouble with chest pain associated with shortness of breath and diaphoresis a couple days ago.  She was in the oncology office and was sent to the ED for evaluation.  Patient was noted to have elevated cardiac enzymes.  Case was discussed with the medical service and cardiology.  ED consulted the medical service for admission.  Patient noted that her heart beat was racing today.  She called her cardiologist and they suggested she go to the ED.  EMS was called.  Patient was noted to be hypotensive and tachycardic.  Patient was given Versed and cardioverted balance.  On arrival patient is somewhat sedated from her cardioversion.  She states she has been having issues with chest discomfort and shortness of breath the last couple of days Home Medications Prior to Admission medications   Medication Sig Start Date End Date Taking? Authorizing Provider  acyclovir (ZOVIRAX) 400 MG tablet Take 1 tablet (400 mg total) by mouth 2 (two) times daily. 03/16/23   Jaci Standard, MD  amiodarone (PACERONE) 200 MG tablet Take 0.5 tablets (100 mg total) by mouth daily. 03/29/23   Yates Decamp, MD  apixaban (ELIQUIS) 5 MG TABS tablet Take 1 tablet (5 mg total) by mouth 2 (two) times daily. 03/01/23 08/28/23  Yates Decamp, MD  Artificial Tear Solution (SOOTHE XP) SOLN Place 1 drop into both eyes every evening.    [provider]  benzonatate (TESSALON) 100 MG capsule TAKE 1 CAPSULE BY MOUTH THREE TIMES A DAY AS  NEEDED FOR COUGH 04/03/23   Johney Maine, MD  Cholecalciferol (VITAMIN D3) 50 MCG (2000 UT) capsule Take 1 capsule (2,000 Units total) by mouth daily. 09/11/22   Jaci Standard, MD  [employer] VITAMIN B12 1000 MCG tablet TAKE 1 TABLET BY MOUTH EVERY DAY 02/15/23   [name] T, PA-C  dapagliflozin propanediol (FARXIGA) 10 MG TABS tablet TAKE 1 TABLET BY MOUTH EVERY DAY 10/03/22   Yates Decamp, MD  dexamethasone (DECADRON) 4 MG tablet Take 5 tablets (20 mg total) by mouth once a week. Take 20 mg (5 tablets) on day of multiple myeloma treatment 10/27/22   Jaci Standard, MD  esomeprazole (NEXIUM) 20 MG capsule Take 20 mg by mouth daily as needed (Heartburn). 12/06/21   [provider]  furosemide (LASIX) 40 MG tablet Take 40 mg by mouth daily as needed for fluid.    [provider]  guaiFENesin (MUCINEX) 600 MG 12 hr tablet Take 600 mg by mouth 2 (two) times daily. 03/08/23   [provider]  hydrocortisone cream 1 % Apply 1 Application topically 2 (two) times daily. 02/13/23   Jaci Standard, MD  lenalidomide (REVLIMID) 25 MG capsule Take 1 capsule (25 mg total) by mouth daily. Take for 14 days,then none for 7 days.Repeat every 21 days. Auth # [id number]  Date obtained 04/13/23 04/13/23   Jaci Standard, MD  levothyroxine (SYNTHROID, LEVOTHROID) 100 MCG tablet Take 100 mcg by mouth  daily before breakfast. 12/29/15   [provider]  metoprolol succinate (TOPROL-XL) 25 MG 24 hr tablet Take 1 tablet (25 mg total) by mouth daily. 03/01/23 05/30/23  Yates Decamp, MD  nystatin (MYCOSTATIN/NYSTOP) powder Apply 1 Application topically 3 (three) times daily. 04/06/23   Briant Cedar, PA-C  OVER THE COUNTER MEDICATION Take 1 tablet by mouth daily. AREDS    [provider]  polyethylene glycol (MIRALAX / GLYCOLAX) 17 g packet Take 17 g by mouth daily as needed for mild constipation.    [provider]  potassium chloride (KLOR-CON M10) 10 MEQ tablet TAKE 2  TABLETS BY MOUTH 2 TIMES DAILY. 03/22/23   Yates Decamp, MD  pravastatin (PRAVACHOL) 40 MG tablet Take 40 mg by mouth every evening.    [provider]  sennosides-docusate sodium (SENOKOT-S) 8.6-50 MG tablet Take 1 tablet by mouth daily.    [provider]  triamcinolone ointment (KENALOG) 0.5 % Apply 1 Application topically 2 (two) times daily. 01/22/23   Jaci Standard, MD      Allergies    Penicillins and Sulfa antibiotics    Review of Systems   Review of Systems  Physical Exam Updated Vital Signs BP 114/70   Pulse 87   Temp 98.8 F (37.1 C) (Temporal)   Resp (!) 23   Ht 1.6 m (5\' 3" )   Wt 67.6 kg   SpO2 94%   BMI 26.39 kg/m  Physical Exam Vitals and nursing note reviewed.  Constitutional:      Appearance: She is well-developed.     Comments: Somnolent  HENT:     Head: Normocephalic and atraumatic.     Right Ear: External ear normal.     Left Ear: External ear normal.  Eyes:     General: No scleral icterus.       Right eye: No discharge.        Left eye: No discharge.     Conjunctiva/sclera: Conjunctivae normal.  Neck:     Trachea: No tracheal deviation.  Cardiovascular:     Rate and Rhythm: Normal rate and regular rhythm.  Pulmonary:     Effort: Pulmonary effort is normal. No respiratory distress.     Breath sounds: Normal breath sounds. No stridor. No wheezing or rales.  Abdominal:     General: Bowel sounds are normal. There is no distension.     Palpations: Abdomen is soft.     Tenderness: There is no abdominal tenderness. There is no guarding or rebound.  Musculoskeletal:        General: No tenderness or deformity.     Cervical back: Neck supple.  Skin:    General: Skin is warm and dry.     Findings: No rash.  Neurological:     General: No focal deficit present.     Mental Status: She is alert.     Cranial Nerves: No cranial nerve deficit, dysarthria or facial asymmetry.     Sensory: No sensory deficit.     Motor: No abnormal muscle  tone or seizure activity.     Coordination: Coordination normal.  Psychiatric:        Mood and Affect: Mood normal.     ED Results / Procedures / Treatments   Labs (all labs ordered are listed, but only abnormal results are displayed) Labs Reviewed  BASIC METABOLIC PANEL - Abnormal; Notable for the following components:      Result Value   Sodium 133 (*)  Chloride 93 (*)    Glucose, Bld 109 (*)    BUN 33 (*)    Calcium 7.9 (*)    GFR, Estimated 55 (*)    All other components within normal limits  CBC - Abnormal; Notable for the following components:   RDW 17.6 (*)    All other components within normal limits  CBG MONITORING, ED - Abnormal; Notable for the following components:   Glucose-Capillary 111 (*)    All other components within normal limits  TROPONIN I (HIGH SENSITIVITY) - Abnormal; Notable for the following components:   Troponin I (High Sensitivity) 263 (*)    All other components within normal limits  TROPONIN I (HIGH SENSITIVITY)    EKG None  Radiology DG Chest Portable 1 View  Result Date: 04/15/2023 CLINICAL DATA:  Atrial fibrillation EXAM: PORTABLE CHEST 1 VIEW COMPARISON:  04/13/2023 FINDINGS: Stable cardiomediastinal silhouette. TAVR. Aortic atherosclerotic calcification. Bibasilar atelectasis. No pleural effusion or pneumothorax. IMPRESSION: Stable exam since 04/13/2023.  Bibasilar atelectasis. Electronically Signed   By: Minerva Fester M.D.   On: 04/15/2023 19:04    Procedures .Critical Care  Performed by: Linwood Dibbles, MD Authorized by: Linwood Dibbles, MD   Critical care provider statement:    Critical care time (minutes):  30   Critical care was time spent personally by me on the following activities:  Development of treatment plan with patient or surrogate, discussions with consultants, evaluation of patient's response to treatment, examination of patient, ordering and review of laboratory studies, ordering and review of radiographic studies, ordering  and performing treatments and interventions, pulse oximetry, re-evaluation of patient's condition and review of old charts     Medications Ordered in ED Medications  sodium chloride 0.9 % bolus 500 mL (0 mLs Intravenous Stopped 04/15/23 2235)    Followed by  0.9 %  sodium chloride infusion (1,000 mLs Intravenous New Bag/Given 04/15/23 2204)  aspirin chewable tablet 324 mg (324 mg Oral Given 04/15/23 1832)    ED Course/ Medical Decision Making/ A&P                             Medical Decision Making Problems Addressed: Atrial fibrillation with RVR [facility]): acute illness or injury that poses a threat to life or bodily functions Elevated troponin: acute illness or injury that poses a threat to life or bodily functions Hyponatremia: chronic illness or injury  Amount and/or Complexity of Data Reviewed External Data Reviewed:     Details: EMS rhythm strips reviewed.  Pt noted to be in a fib RVR.  Pt cardioverted, back in nsr Labs: ordered. Decision-making details documented in ED Course. Radiology: ordered and independent interpretation performed.  Risk OTC drugs. Prescription drug management.   Patient presented to the ED for evaluation of tachycardia and low blood pressure lightheadedness.  Patient has a history of paroxysmal atrial fibrillation.  Patient was also undergoing treatment for multiple myeloma.  EMS noted the patient to be in rapid A-fib and she underwent cardioversion in the ambulance.  On arrival to the ED patient was somewhat sedated due to the Versed she was given but was back in sinus rhythm.  Her mental status has returned to baseline.  Patient is not having any chest pain.  No shortness of breath.  She has remained in sinus rhythm.  Labs however do show elevated troponins.  I discussed this with Dr. Odis Hollingshead, cardiology.  He suspects this is related to her tachycardia but does  recommend admission to the hospital serial enzymes, [facility] cardiology will see patient in the  a.m.        Final Clinical Impression(s) / ED Diagnoses Final diagnoses:  Atrial fibrillation with RVR (HCC)  Elevated troponin  Hyponatremia    Rx / DC Orders ED Discharge Orders     None         Linwood Dibbles, MD 04/15/23 2238

## 2023-04-15 NOTE — Assessment & Plan Note (Signed)
Appreciate cardiology input. Currently back to sinus Continue home medications Repeat echogram Eliquis 5 mg twice daily Continue amiodarone 100 mg daily Hold toprol for tonight given hypotension

## 2023-04-15 NOTE — Telephone Encounter (Signed)
ON-CALL CARDIOLOGY 04/15/23  Patient's name: Zoe [name].   MRN: [id number].    DOB: 06-Jan-1934 Primary care provider: [facility]. Primary cardiologist: Dr. Yates Decamp  Interaction regarding this patient's care today: Called the answering service with regards to her care.  Was in her usual state of health.  However started noticing high heart rates and soft blood pressures.  Neighbors who are former paramedics checked her vitals: Blood pressure 90/52 and pulse ranging between 80-155 bpm, 96% on room air  Patient is iWatch alerting her of underlying rhythm being A-fib with an average heart rate of 143 bpm  Denies anginal chest pain.  Requesting further assistance/guidance.  Impression:   ICD-10-CM   1. Atrial fibrillation with rapid ventricular response (HCC)  I48.91      Recommendations: Since she is symptomatic and her iWatch is alerting her of the underlying rhythm to be A-fib and she is hypotensive recommended that she go to ER for further evaluation and management.  Patient has been anticoagulated appropriately for at least 4 weeks without interruption.   Direct-current cardioversion can be considered based on ED's evaluation of the patient.  Patient and neighbors voiced understanding.  Telephone encounter total time: 10 minutes  Delilah Shan [facility]  Pager: [phone] Office: [phone]

## 2023-04-15 NOTE — ED Notes (Signed)
Pt had fall today and is complaining of right hip pain

## 2023-04-15 NOTE — Assessment & Plan Note (Signed)
-   Check TSH continue home medications Synthroid at 100 mcg po q day  

## 2023-04-15 NOTE — H&P (Signed)
Zoe [name]:[id number] DOB: 03-23-1934 DOA: 04/15/2023     PCP: Irena Reichmann, DO   [facility]  CARDS:  Dr. Yates Decamp, MD  NEphrology: *  Dr. NEurology *   Dr. Pulmonary *  Dr.  Oncology   Dr. Leonides Schanz GI* Dr.  Deboraha Sprang, LB) No care team member to display Urology Dr. *  Patient arrived to ER on 04/15/23 at 1751 Referred by Attending Linwood Dibbles, MD   Patient coming from:    home Lives alone,   *** With family From facility ***  Chief Complaint:   Chief Complaint  Patient presents with   Atrial Fibrillation    HPI: Zoe is a 89 y.o. female with medical history significant of A-fib on Eliquis and amiodarone, hypothyroidism, HTN, breast cancer, multiple myeloma, Aortic stenosis status post TAVR   Presented with   fast heart rate Patient today was noted to have hypotension and tachycardia blood pressure was checked by the neighbor was 90/52 and heart rates going up to 155.  She noted that her heart rate on her iWatch was alerting her for A-fib no associated chest pain Since patient was anticoagulated cardioversion was attempted by EMS she was given 200 J of cardioversion Patient endorses chest discomfort and shortness of breath has been ongoing for the past few days Has been compliant with her amiodarone and Eliquis   She was having some recent CP and was seen in Er noted trop in 45 cardiology felt pt was safe for home discharge   She had a fall but no injury  Denies significant ETOH intake *** Does not smoke*** but interested in quitting***  Lab Results  Component Value Date   SARSCOV2NAA NEGATIVE 04/13/2023   SARSCOV2NAA NEGATIVE 05/16/2022        Regarding pertinent Chronic problems:    Hyperlipidemia - on statins Pravachol (pravastatin)  Lipid Panel     Component Value Date/Time   CHOL 174 03/30/2023 1415   CHOL 103 07/16/2019 0941   TRIG 76 03/30/2023 1415   HDL 59 03/30/2023 1415   HDL 48 07/16/2019 0941   CHOLHDL  2.9 03/30/2023 1415   VLDL 15 03/30/2023 1415   LDLCALC 100 (H) 03/30/2023 1415   LDLCALC 39 07/16/2019 0941   LDLDIRECT 48 07/16/2019 0941   LABVLDL 16 07/16/2019 0941     HTN on Toprol   chronic CHF diastolic/systolic/ combined - last echo  Recent Results (from the past 33295 hour(s))  ECHOCARDIOGRAM COMPLETE   Collection Time: 02/17/23  2:37 PM  Result Value   Weight 2,560   Height 63   BP 162/76   S' Lateral 2.70   AR max vel 1.80   AV Area VTI 2.30   AV Mean grad 10.0   AV Peak grad 17.5   Ao pk vel 2.09   Area-P 1/2 2.62   AV Area mean vel 1.84   AV Vena cont 0.30   Est EF 55 - 60%   Narrative      ECHOCARDIOGRAM REPORT      1. Left ventricular ejection fraction, by estimation, is 55 to 60%. The left ventricle has normal function. The left ventricle has no regional wall motion abnormalities. There is mild left ventricular hypertrophy. Left ventricular diastolic parameters  are consistent with Grade II diastolic dysfunction (pseudonormalization). Elevated left atrial pressure.  2. Right ventricular systolic function is normal. The right ventricular size is normal. There is mildly elevated pulmonary artery systolic pressure.  3. Left atrial  size was severely dilated.  4. The mitral valve is abnormal. Mild mitral valve regurgitation. Moderate mitral stenosis.  5. The tricuspid valve is abnormal.  6. Edwards Sapien 3 THV size 23 mm is in the AV position. . The aortic valve has been repaired/replaced. Aortic valve regurgitation is not visualized. No aortic stenosis is present.  7. The inferior vena cava is dilated in size with >50% respiratory variability, suggesting right atrial pressure of 8 mmHg.         *** CAD  - On Aspirin, statin, betablocker, Plavix                 - *followed by cardiology                - last cardiac cath         Hypothyroidism:  Lab Results  Component Value Date   TSH 1.499 02/17/2023   on synthroid    *** COPD - not **followed by  pulmonology *** not  on baseline oxygen  *L,    *** OSA -on nocturnal oxygen, *CPAP, *noncompliant with CPAP  *** Hx of CVA - *with/out residual deficits on Aspirin 81 mg, 325, Plavix   A. Fib -  - CHA2DS2 vas score   8   current  on anticoagulation with   Eliquis,       -  Rate control:  Currently controlled with  Toprolol,          - Rhythm control:  amiodarone,   ***Hx of DVT/PE on - anticoagulation with ****Coumadin  ***Xarelto,* Eliquis,     ***BPH - on Flomax, Proscar    *** Dementia - on Aricept** Nemenda      While in ER:     Troponin elevated 263     Lab Orders         Basic metabolic panel         CBC         CBG monitoring, ED     CXR -  Bibasilar atelectasis.     Following Medications were ordered in ER: Medications  sodium chloride 0.9 % bolus 500 mL (500 mLs Intravenous New Bag/Given 04/15/23 2204)    Followed by  0.9 %  sodium chloride infusion (1,000 mLs Intravenous New Bag/Given 04/15/23 2204)  aspirin chewable tablet 324 mg (324 mg Oral Given 04/15/23 1832)    _______________________________________________________ ER Provider Called:  Cardiology    Dr. Odis Hollingshead  They Recommend admit to medicine   Will see in AM    ED Triage Vitals  Enc Vitals Group     BP 04/15/23 1805 97/60     Pulse Rate 04/15/23 1805 86     Resp 04/15/23 1805 (!) 21     Temp 04/15/23 1809 99.2 F (37.3 C)     Temp Source 04/15/23 1809 Temporal     SpO2 04/15/23 1805 100 %     Weight 04/15/23 1758 149 lb (67.6 kg)     Height 04/15/23 1758 5\' 3"  (1.6 m)     Head Circumference --      Peak Flow --      Pain Score 04/15/23 1756 0     Pain Loc --      Pain Edu? --      Excl. in GC? --   TMAX(24)@     _________________________________________ Significant initial  Findings: Abnormal Labs Reviewed  BASIC METABOLIC PANEL - Abnormal; Notable for the following components:  Result Value   Sodium 133 (*)    Chloride 93 (*)    Glucose, Bld 109 (*)    BUN 33 (*)    Calcium  7.9 (*)    GFR, Estimated 55 (*)    All other components within normal limits  CBC - Abnormal; Notable for the following components:   RDW 17.6 (*)    All other components within normal limits  CBG MONITORING, ED - Abnormal; Notable for the following components:   Glucose-Capillary 111 (*)    All other components within normal limits  TROPONIN I (HIGH SENSITIVITY) - Abnormal; Notable for the following components:   Troponin I (High Sensitivity) 263 (*)    All other components within normal limits      _________________________ Troponin   Cardiac Panel (last 3 results) Recent Labs    04/13/23 1232 04/15/23 1805 04/15/23 2145  TROPONINIHS 97* 263* 255*     ECG: Ordered Personally reviewed and interpreted by me showing: HR : 86 Rhythm: Sinus rhythm Probable left atrial enlargement Probable left ventricular hypertrophy No significant change since last tracing QTC 449  BNP (last 3 results) Recent Labs    06/19/22 1012 02/17/23 0922 04/13/23 0950  BNP 176.8* 454.3* 266.5*     COVID-19 Labs  No results for input(s): "DDIMER", "FERRITIN", "LDH", "CRP" in the last 72 hours.  Lab Results  Component Value Date   SARSCOV2NAA NEGATIVE 04/13/2023   SARSCOV2NAA NEGATIVE 05/16/2022     The recent clinical data is shown below. Vitals:   04/15/23 1900 04/15/23 2000 04/15/23 2200 04/15/23 2203  BP: (!) 108/44 (!) 99/21 114/70   Pulse: 84 90 87   Resp: (!) 28 (!) 25 (!) 23   Temp:    98.8 F (37.1 C)  TempSrc:    Temporal  SpO2: 100% 94% 94%   Weight:      Height:        WBC     Component Value Date/Time   WBC 9.8 04/15/2023 1805   LYMPHSABS 0.2 (L) 04/13/2023 0907   MONOABS 0.8 04/13/2023 0907   EOSABS 0.2 04/13/2023 0907   BASOSABS 0.0 04/13/2023 0907      Lactic Acid, Venous    Component Value Date/Time   LATICACIDVEN 0.8 02/17/2023 0948      UA  ordered     Results for orders placed or performed during the hospital encounter of 04/13/23  Resp  panel by RT-PCR (RSV, Flu A&B, Covid) Anterior Nasal Swab     Status: None   Collection Time: 04/13/23 10:20 AM   Specimen: Anterior Nasal Swab  Result Value Ref Range Status   SARS Coronavirus 2 by RT PCR NEGATIVE NEGATIVE Final         Influenza A by PCR NEGATIVE NEGATIVE Final   Influenza B by PCR NEGATIVE NEGATIVE Final         Resp Syncytial Virus by PCR NEGATIVE NEGATIVE Final    6      ____________________________________________________________  Arterial ***Venous  Blood Gas result:  pH *** pCO2 ***; pO2 ***;     %O2 Sat ***.  ABG    Component Value Date/Time   PHART 7.436 08/30/2018 0912   PCO2ART 37.7 08/30/2018 0912   PO2ART 85.6 08/30/2018 0912   HCO3 25.0 08/30/2018 0912   TCO2 30 09/03/2018 0947   O2SAT 96.9 08/30/2018 0912    __________________________________________________________ Recent Labs  Lab 04/13/23 0907 04/13/23 0944 04/15/23 1805  NA 131* 129* 133*  K 3.7 3.7  3.5  CO2 30 25 25   GLUCOSE 133* 121* 109*  BUN 17 17 33*  CREATININE 0.90 0.83 1.00  CALCIUM 8.6* 8.2* 7.9*    Cr  stable,    Lab Results  Component Value Date   CREATININE 1.00 04/15/2023   CREATININE 0.83 04/13/2023   CREATININE 0.90 04/13/2023    Recent Labs  Lab 04/13/23 0907  AST 11*  ALT 14  ALKPHOS 78  BILITOT 1.1  PROT 5.4*  ALBUMIN 3.2*   Lab Results  Component Value Date   CALCIUM 7.9 (L) 04/15/2023    Plt: Lab Results  Component Value Date   PLT 184 04/15/2023       Recent Labs  Lab 04/13/23 0907 04/13/23 0944 04/15/23 1805  WBC 5.8 7.1 9.8  NEUTROABS 4.5  --   --   HGB 13.8 13.0 13.4  HCT 41.7 39.9 41.2  MCV 86.2 86.9 86.2  PLT 105* 106* 184    HG/HCT  stable,      Component Value Date/Time   HGB 13.4 04/15/2023 1805   HGB 13.8 04/13/2023 0907   HGB 10.4 (L) 07/16/2019 0941   HCT 41.2 04/15/2023 1805   HCT 32.0 (L) 07/16/2019 0941   MCV 86.2 04/15/2023 1805   MCV 83 07/16/2019 0941      _______________________________________________ Hospitalist was called for admission for   Atrial fibrillation with RVR (HCC),    Elevated troponin  Hyponatremia    The following Work up has been ordered so far:  Orders Placed This Encounter  Procedures   Critical Care   DG Chest Portable 1 View   Basic metabolic panel   CBC   ED Cardiac monitoring   Initiate Carrier Fluid Protocol   Consult to hospitalist   CBG monitoring, ED   ED EKG   Insert peripheral IV   Insert peripheral IV     OTHER Significant initial  Findings:  labs showing:  DM  labs:  HbA1C: Recent Labs    03/30/23 1416  HGBA1C 5.2    CBG (last 3)  Recent Labs    04/15/23 1831  GLUCAP 111*       Cultures:    Component Value Date/Time   SDES  08/08/2022 1115    URINE, CLEAN CATCH Performed at [facility], [address], [place], [place] [address]    SPECREQUEST  08/08/2022 1115    NONE Performed at [facility], [address], [place], [place] [address]    CULT 50,000 COLONIES/mL ESCHERICHIA COLI (A) 08/08/2022 1115   REPTSTATUS 08/10/2022 FINAL 08/08/2022 1115     Radiological Exams on Admission: DG Chest Portable 1 View  Result Date: 04/15/2023 CLINICAL DATA:  Atrial fibrillation EXAM: PORTABLE CHEST 1 VIEW COMPARISON:  04/13/2023 FINDINGS: Stable cardiomediastinal silhouette. TAVR. Aortic atherosclerotic calcification. Bibasilar atelectasis. No pleural effusion or pneumothorax. IMPRESSION: Stable exam since 04/13/2023.  Bibasilar atelectasis. Electronically Signed   By: Minerva Fester M.D.   On: 04/15/2023 19:04   _______________________________________________________________________________________________________ Latest  Blood pressure 114/70, pulse 87, temperature 98.8 F (37.1 C), temperature source Temporal, resp. rate (!) 23, height 5\' 3"  (1.6 m), weight 67.6 kg, SpO2 94 %.   Vitals  labs and radiology finding personally reviewed   Review of Systems:    Pertinent positives include: ***  Constitutional:  No weight loss, night sweats, Fevers, chills, fatigue, weight loss  HEENT:  No headaches, Difficulty swallowing,Tooth/dental problems,Sore throat,  No sneezing, itching, ear ache, nasal congestion, post nasal  drip,  Cardio-vascular:  No chest pain, Orthopnea, PND, anasarca, dizziness, palpitations.no Bilateral lower extremity swelling  GI:  No heartburn, indigestion, abdominal pain, nausea, vomiting, diarrhea, change in bowel habits, loss of appetite, melena, blood in stool, hematemesis Resp:  no shortness of breath at rest. No dyspnea on exertion, No excess mucus, no productive cough, No non-productive cough, No coughing up of blood.No change in color of mucus.No wheezing. Skin:  no rash or lesions. No jaundice GU:  no dysuria, change in color of urine, no urgency or frequency. No straining to urinate.  No flank pain.  Musculoskeletal:  No joint pain or no joint swelling. No decreased range of motion. No back pain.  Psych:  No change in mood or affect. No depression or anxiety. No memory loss.  Neuro: no localizing neurological complaints, no tingling, no weakness, no double vision, no gait abnormality, no slurred speech, no confusion  All systems reviewed and apart from HOPI all are negative _______________________________________________________________________________________________ Past Medical History:   Past Medical History:  Diagnosis Date   Arthritis    some - per patient   Breast cancer (HCC)    breast cancer / left    Cataract    bilat    GERD (gastroesophageal reflux disease)    History of kidney stones    Hyperlipidemia    Hypertension    Hypothyroidism    Macular degeneration    Left   S/P TAVR (transcatheter aortic valve replacement) 09/03/2018   23 mm Edwards Sapien 3 transcatheter heart valve placed via percutaneous right transfemoral approach    Severe aortic stenosis     Stress incontinence    Thyroid disease    Tinnitus      Past Surgical History:  Procedure Laterality Date   ABDOMINAL HYSTERECTOMY  1970's   BACK SURGERY     BREAST LUMPECTOMY  12/1998   lumpectomy   CARDIAC CATHETERIZATION     EYE SURGERY     cataract surgery bilat    INTRAOPERATIVE TRANSTHORACIC ECHOCARDIOGRAM N/A 09/03/2018   Procedure: INTRAOPERATIVE TRANSTHORACIC ECHOCARDIOGRAM;  Surgeon: Kathleene Hazel, MD;  Location: [facility] OR;  Service: Open Heart Surgery;  Laterality: N/A;   KYPHOPLASTY N/A 09/07/2022   Procedure: THORACIC EIGHT KYPHOPLASTY;  Surgeon: Venita Lick, MD;  Location: [facility] OR;  Service: Orthopedics;  Laterality: N/A;  1 hr Local with IV Regional 3 C-Bed   LITHOTRIPSY     Right total knee     2018 Dr. Charlann Boxer   RIGHT/LEFT HEART CATH AND CORONARY ANGIOGRAPHY N/A 08/06/2018   Procedure: RIGHT/LEFT HEART CATH AND CORONARY ANGIOGRAPHY;  Surgeon: Yates Decamp, MD;  Location: [facility] INVASIVE CV LAB;  Service: Cardiovascular;  Laterality: N/A;   THYROIDECTOMY, PARTIAL  1975   TONSILLECTOMY     as a child - patient not sure of exact date   TOTAL KNEE ARTHROPLASTY Left 03/13/2016   Procedure: TOTAL KNEE ARTHROPLASTY;  Surgeon: Durene Romans, MD;  Location: [facility] ORS;  Service: Orthopedics;  Laterality: Left;   TOTAL KNEE ARTHROPLASTY Right 06/18/2017   Procedure: RIGHT TOTAL KNEE ARTHROPLASTY;  Surgeon: Durene Romans, MD;  Location: [facility] ORS;  Service: Orthopedics;  Laterality: Right;   TRANSCATHETER AORTIC VALVE REPLACEMENT, TRANSFEMORAL N/A 09/03/2018   Procedure: TRANSCATHETER AORTIC VALVE REPLACEMENT, TRANSFEMORAL;  Surgeon: Kathleene Hazel, MD;  Location: [facility] OR;  Service: Open Heart Surgery;  Laterality: N/A;    Social History:  Ambulatory *** independently cane, walker  wheelchair bound, bed bound     reports that she has never smoked.  She has never used smokeless tobacco. She reports that she does not drink alcohol and does not use drugs.    Family History:    Family History  Problem Relation Age of Onset   Diabetes Mother    Stroke Mother        Carotid artery disease   Heart disease Father        CAD   Coronary artery disease Father    Diabetes Sister    ______________________________________________________________________________________________ Allergies: Allergies  Allergen Reactions   Penicillins Other (See Comments)    UNSPECIFIED REACTION  Patient does not remember reaction.  Has patient had a PCN reaction causing immediate rash, facial/tongue/throat swelling, SOB or lightheadedness with hypotension: no Has patient had a PCN reaction causing severe rash involving mucus membranes or skin necrosis: no Has patient had a PCN reaction that required hospitalization no Has patient had a PCN reaction occurring within the last 10 years: no If all of the above answers are "NO", then may proceed with Cephalosporin use.    Sulfa Antibiotics Other (See Comments)    UNSPECIFIED REACTION  "maybe vision issues? "     Prior to Admission medications   Medication Sig Start Date End Date Taking? Authorizing Provider  acyclovir (ZOVIRAX) 400 MG tablet Take 1 tablet (400 mg total) by mouth 2 (two) times daily. 03/16/23   Jaci Standard, MD  amiodarone (PACERONE) 200 MG tablet Take 0.5 tablets (100 mg total) by mouth daily. 03/29/23   Yates Decamp, MD  apixaban (ELIQUIS) 5 MG TABS tablet Take 1 tablet (5 mg total) by mouth 2 (two) times daily. 03/01/23 08/28/23  Yates Decamp, MD  Artificial Tear Solution (SOOTHE XP) SOLN Place 1 drop into both eyes every evening.    [provider]  benzonatate (TESSALON) 100 MG capsule TAKE 1 CAPSULE BY MOUTH THREE TIMES A DAY AS NEEDED FOR COUGH 04/03/23   Johney Maine, MD  Cholecalciferol (VITAMIN D3) 50 MCG (2000 UT) capsule Take 1 capsule (2,000 Units total) by mouth daily. 09/11/22   Jaci Standard, MD  [employer] VITAMIN B12 1000 MCG tablet TAKE 1 TABLET BY MOUTH EVERY DAY 02/15/23   [name] T,  PA-C  dapagliflozin propanediol (FARXIGA) 10 MG TABS tablet TAKE 1 TABLET BY MOUTH EVERY DAY 10/03/22   Yates Decamp, MD  dexamethasone (DECADRON) 4 MG tablet Take 5 tablets (20 mg total) by mouth once a week. Take 20 mg (5 tablets) on day of multiple myeloma treatment 10/27/22   Jaci Standard, MD  esomeprazole (NEXIUM) 20 MG capsule Take 20 mg by mouth daily as needed (Heartburn). 12/06/21   [provider]  furosemide (LASIX) 40 MG tablet Take 40 mg by mouth daily as needed for fluid.    [provider]  guaiFENesin (MUCINEX) 600 MG 12 hr tablet Take 600 mg by mouth 2 (two) times daily. 03/08/23   [provider]  hydrocortisone cream 1 % Apply 1 Application topically 2 (two) times daily. 02/13/23   Jaci Standard, MD  lenalidomide (REVLIMID) 25 MG capsule Take 1 capsule (25 mg total) by mouth daily. Take for 14 days,then none for 7 days.Repeat every 21 days. Auth # [id number]  Date obtained 04/13/23 04/13/23   Jaci Standard, MD  levothyroxine (SYNTHROID, LEVOTHROID) 100 MCG tablet Take 100 mcg by mouth daily before breakfast. 12/29/15   [provider]  metoprolol succinate (TOPROL-XL) 25 MG 24 hr tablet Take 1 tablet (25 mg total)  by mouth daily. 03/01/23 05/30/23  Yates Decamp, MD  nystatin (MYCOSTATIN/NYSTOP) powder Apply 1 Application topically 3 (three) times daily. 04/06/23   Briant Cedar, PA-C  OVER THE COUNTER MEDICATION Take 1 tablet by mouth daily. AREDS    [provider]  polyethylene glycol (MIRALAX / GLYCOLAX) 17 g packet Take 17 g by mouth daily as needed for mild constipation.    [provider]  potassium chloride (KLOR-CON M10) 10 MEQ tablet TAKE 2 TABLETS BY MOUTH 2 TIMES DAILY. 03/22/23   Yates Decamp, MD  pravastatin (PRAVACHOL) 40 MG tablet Take 40 mg by mouth every evening.    [provider]  sennosides-docusate sodium (SENOKOT-S) 8.6-50 MG tablet Take 1 tablet by mouth daily.    [provider]  triamcinolone  ointment (KENALOG) 0.5 % Apply 1 Application topically 2 (two) times daily. 01/22/23   Jaci Standard, MD    ___________________________________________________________________________________________________ Physical Exam:    04/15/2023   10:00 PM 04/15/2023    8:00 PM 04/15/2023    7:00 PM  Vitals with BMI  Systolic 114 99 108  Diastolic 70 21 44  Pulse 87 90 84     1. General:  in No  Acute distress   Chronically ill -appearing 2. Psychological: Alert and   Oriented 3. Head/ENT:  Dry Mucous Membranes                          Head Non traumatic, neck supple                            Poor Dentition 4. SKIN:  decreased Skin turgor,  Skin clean Dry and intact no rash     5. Heart: Regular rate and rhythm no  Murmur, no Rub or gallop 6. Lungs:   no wheezes or crackles   7. Abdomen: Soft, ***non-tender, Non distended *** obese ***bowel sounds present 8. Lower extremities: no clubbing, cyanosis, no ***edema 9. Neurologically Grossly intact, moving all 4 extremities equally *** strength 5 out of 5 in all 4 extremities cranial nerves II through XII intact 10. MSK: Normal range of motion    Chart has been reviewed  ______________________________________________________________________________________________  Assessment/Plan 89 y.o. female with medical history significant of A-fib on Eliquis and amiodarone, hypothyroidism, HTN, breast cancer, multiple myeloma, Aortic stenosis status post TAVR    Admitted for   Atrial fibrillation with RVR   Elevated troponin  Hyponatremia     Present on Admission: **None**     No problem-specific Assessment & Plan notes found for this encounter.    Other plan as per orders.  DVT prophylaxis:  SCD *** Lovenox       Code Status:    Code Status: Prior FULL CODE *** DNR/DNI ***comfort care as per patient ***family  I had personally discussed CODE STATUS with patient and family* I had spent *min discussing goals of care and CODE  STATUS ACP has been reviewed ***   Family Communication:   Family not at  Bedside  plan of care was discussed on the phone with *** Son, Daughter, Wife, Husband, Sister, Brother , father, mother  Diet    Disposition Plan:   *** likely will need placement for rehabilitation                          Back to current facility when stable  To home once workup is complete and patient is stable  ***Following barriers for discharge:                            Electrolytes corrected                               Anemia corrected                             Pain controlled with PO medications                               Afebrile, white count improving able to transition to PO antibiotics                             Will need to be able to tolerate PO                            Will likely need home health, home O2, set up                           Will need consultants to evaluate patient prior to discharge  ****EXPECT DC tomorrow       Consult Orders  (From admission, onward)           Start     Ordered   04/15/23 2143  Consult to hospitalist  Once       Provider:  (Not yet assigned)  Question Answer Comment  Place call to: [facility]   Reason for Consult Admit      04/15/23 2142                              ***Would benefit from PT/OT eval prior to DC  Ordered                   Swallow eval - SLP ordered                   Diabetes care coordinator                   Transition of care consulted                   Nutrition    consulted                  Wound care  consulted                   Palliative care    consulted                   Behavioral health  consulted                    Consults called: ***     Admission status:  ED Disposition     None        Obs***  ***  inpatient     I Expect 2 midnight stay secondary to severity of patient's current illness need for inpatient interventions justified by the  following: ***  hemodynamic instability despite optimal treatment (tachycardia *hypotension * tachypnea *hypoxia, hypercapnia) * Severe lab/radiological/exam abnormalities including:     and extensive comorbidities including: *substance abuse  *Chronic pain *DM2  * CHF * CAD  * COPD/asthma *Morbid Obesity * CKD *dementia *liver disease *history of stroke with residual deficits *  malignancy, * sickle cell disease  History of amputation Chronic anticoagulation  That are currently affecting medical management.   I expect  patient to be hospitalized for 2 midnights requiring inpatient medical care.  Patient is at high risk for adverse outcome (such as loss of life or disability) if not treated.  Indication for inpatient stay as follows:  Severe change from baseline regarding mental status Hemodynamic instability despite maximal medical therapy,  ongoing suicidal ideations,  severe pain requiring acute inpatient management,  inability to maintain oral hydration   persistent chest pain despite medical management Need for operative/procedural  intervention New or worsening hypoxia   Need for IV antibiotics, IV fluids, IV rate controling medications, IV antihypertensives, IV pain medications, IV anticoagulation, need for biPAP    Level of care   *** tele  For 12H 24H     medical floor       progressive tele indefinitely please discontinue once patient no longer qualifies COVID-19 Labs    Lab Results  Component Value Date   SARSCOV2NAA NEGATIVE 04/13/2023     Precautions: admitted as *** Covid Negative  ***asymptomatic screening protocol****PUI *** covid positive No active isolations ***If Covid PCR is negative  - please DC precautions - would need additional investigation given very high risk for false native test result    Critical***  Patient is critically ill due to  hemodynamic instability * respiratory failure *severe sepsis* ongoing chest pain*  They are at  high risk for life/limb threatening clinical deterioration requiring frequent reassessment and modifications of care.  Services provided include examination of the patient, review of relevant ancillary tests, prescription of lifesaving therapies, review of medications and prophylactic therapy.  Total critical care time excluding separately billable procedures: 60*  Minutes.    Bowden Boody 04/15/2023, 10:29 PM ***  [facility]     after 2 AM please page floor coverage PA If 7AM-7PM, please contact the day team taking care of the patient using [email or web address]

## 2023-04-15 NOTE — Assessment & Plan Note (Signed)
In the setting of recent cardioversion and tachycardia  No  curretnly CP troponin is stable Echo in AM  Cardiology is aware

## 2023-04-16 ENCOUNTER — Telehealth: Payer: Self-pay

## 2023-04-16 ENCOUNTER — Observation Stay (HOSPITAL_COMMUNITY): Payer: Medicare Other

## 2023-04-16 ENCOUNTER — Other Ambulatory Visit: Payer: Self-pay

## 2023-04-16 ENCOUNTER — Ambulatory Visit (HOSPITAL_COMMUNITY): Payer: Medicare Other

## 2023-04-16 DIAGNOSIS — I4819 Other persistent atrial fibrillation: Secondary | ICD-10-CM | POA: Diagnosis not present

## 2023-04-16 DIAGNOSIS — E878 Other disorders of electrolyte and fluid balance, not elsewhere classified: Secondary | ICD-10-CM | POA: Diagnosis present

## 2023-04-16 DIAGNOSIS — F32A Depression, unspecified: Secondary | ICD-10-CM | POA: Diagnosis present

## 2023-04-16 DIAGNOSIS — Z1152 Encounter for screening for COVID-19: Secondary | ICD-10-CM | POA: Diagnosis not present

## 2023-04-16 DIAGNOSIS — E039 Hypothyroidism, unspecified: Secondary | ICD-10-CM | POA: Diagnosis present

## 2023-04-16 DIAGNOSIS — I4891 Unspecified atrial fibrillation: Secondary | ICD-10-CM | POA: Diagnosis not present

## 2023-04-16 DIAGNOSIS — I110 Hypertensive heart disease with heart failure: Secondary | ICD-10-CM | POA: Diagnosis present

## 2023-04-16 DIAGNOSIS — I5032 Chronic diastolic (congestive) heart failure: Secondary | ICD-10-CM | POA: Diagnosis not present

## 2023-04-16 DIAGNOSIS — N179 Acute kidney failure, unspecified: Secondary | ICD-10-CM | POA: Diagnosis not present

## 2023-04-16 DIAGNOSIS — C9000 Multiple myeloma not having achieved remission: Secondary | ICD-10-CM | POA: Diagnosis present

## 2023-04-16 DIAGNOSIS — E02 Subclinical iodine-deficiency hypothyroidism: Secondary | ICD-10-CM | POA: Diagnosis not present

## 2023-04-16 DIAGNOSIS — I4892 Unspecified atrial flutter: Secondary | ICD-10-CM | POA: Diagnosis present

## 2023-04-16 DIAGNOSIS — Z953 Presence of xenogenic heart valve: Secondary | ICD-10-CM | POA: Diagnosis not present

## 2023-04-16 DIAGNOSIS — F05 Delirium due to known physiological condition: Secondary | ICD-10-CM | POA: Diagnosis not present

## 2023-04-16 DIAGNOSIS — E785 Hyperlipidemia, unspecified: Secondary | ICD-10-CM

## 2023-04-16 DIAGNOSIS — E871 Hypo-osmolality and hyponatremia: Secondary | ICD-10-CM | POA: Diagnosis present

## 2023-04-16 DIAGNOSIS — I959 Hypotension, unspecified: Secondary | ICD-10-CM | POA: Diagnosis not present

## 2023-04-16 DIAGNOSIS — G3184 Mild cognitive impairment, so stated: Secondary | ICD-10-CM | POA: Diagnosis present

## 2023-04-16 DIAGNOSIS — R072 Precordial pain: Secondary | ICD-10-CM | POA: Diagnosis not present

## 2023-04-16 DIAGNOSIS — K219 Gastro-esophageal reflux disease without esophagitis: Secondary | ICD-10-CM | POA: Diagnosis present

## 2023-04-16 DIAGNOSIS — Z8673 Personal history of transient ischemic attack (TIA), and cerebral infarction without residual deficits: Secondary | ICD-10-CM | POA: Diagnosis not present

## 2023-04-16 DIAGNOSIS — I480 Paroxysmal atrial fibrillation: Secondary | ICD-10-CM | POA: Diagnosis not present

## 2023-04-16 DIAGNOSIS — I10 Essential (primary) hypertension: Secondary | ICD-10-CM | POA: Diagnosis not present

## 2023-04-16 DIAGNOSIS — I5033 Acute on chronic diastolic (congestive) heart failure: Secondary | ICD-10-CM | POA: Diagnosis present

## 2023-04-16 DIAGNOSIS — W19XXXA Unspecified fall, initial encounter: Secondary | ICD-10-CM | POA: Diagnosis present

## 2023-04-16 DIAGNOSIS — I509 Heart failure, unspecified: Secondary | ICD-10-CM | POA: Diagnosis not present

## 2023-04-16 DIAGNOSIS — J9811 Atelectasis: Secondary | ICD-10-CM | POA: Diagnosis present

## 2023-04-16 DIAGNOSIS — M1990 Unspecified osteoarthritis, unspecified site: Secondary | ICD-10-CM | POA: Diagnosis present

## 2023-04-16 DIAGNOSIS — M25551 Pain in right hip: Secondary | ICD-10-CM | POA: Diagnosis not present

## 2023-04-16 DIAGNOSIS — R296 Repeated falls: Secondary | ICD-10-CM | POA: Diagnosis present

## 2023-04-16 DIAGNOSIS — I484 Atypical atrial flutter: Secondary | ICD-10-CM | POA: Diagnosis not present

## 2023-04-16 DIAGNOSIS — F419 Anxiety disorder, unspecified: Secondary | ICD-10-CM | POA: Diagnosis present

## 2023-04-16 DIAGNOSIS — I080 Rheumatic disorders of both mitral and aortic valves: Secondary | ICD-10-CM | POA: Diagnosis not present

## 2023-04-16 DIAGNOSIS — R001 Bradycardia, unspecified: Secondary | ICD-10-CM | POA: Diagnosis not present

## 2023-04-16 LAB — COMPREHENSIVE METABOLIC PANEL
ALT: 18 U/L (ref 0–44)
AST: 15 U/L (ref 15–41)
Albumin: 2.3 g/dL — ABNORMAL LOW (ref 3.5–5.0)
Alkaline Phosphatase: 64 U/L (ref 38–126)
Anion gap: 13 (ref 5–15)
BUN: 29 mg/dL — ABNORMAL HIGH (ref 8–23)
CO2: 26 mmol/L (ref 22–32)
Calcium: 7.8 mg/dL — ABNORMAL LOW (ref 8.9–10.3)
Chloride: 95 mmol/L — ABNORMAL LOW (ref 98–111)
Creatinine, Ser: 0.91 mg/dL (ref 0.44–1.00)
GFR, Estimated: >60 mL/min (ref >60)
Glucose, Bld: 112 mg/dL — ABNORMAL HIGH (ref 70–99)
Potassium: 3.5 mmol/L (ref 3.5–5.1)
Sodium: 134 mmol/L — ABNORMAL LOW (ref 135–145)
Total Bilirubin: 1.1 mg/dL (ref 0.3–1.2)
Total Protein: 4.6 g/dL — ABNORMAL LOW (ref 6.5–8.1)

## 2023-04-16 LAB — ECHOCARDIOGRAM COMPLETE
AR max vel: 1.75 cm2
AV Area VTI: 1.84 cm2
AV Area mean vel: 1.79 cm2
AV Mean grad: 17 mmHg
AV Peak grad: 29.5 mmHg
Ao pk vel: 2.72 m/s
Area-P 1/2: 3.17 cm2
Height: 63 in
MV VTI: 2.33 cm2
S' Lateral: 2.4 cm
Weight: 2384 oz

## 2023-04-16 LAB — HEPATIC FUNCTION PANEL
ALT: 19 U/L (ref 0–44)
AST: 17 U/L (ref 15–41)
Albumin: 2.4 g/dL — ABNORMAL LOW (ref 3.5–5.0)
Alkaline Phosphatase: 69 U/L (ref 38–126)
Bilirubin, Direct: 0.2 mg/dL (ref 0.0–0.2)
Indirect Bilirubin: 0.6 mg/dL (ref 0.3–0.9)
Total Bilirubin: 0.8 mg/dL (ref 0.3–1.2)
Total Protein: 4.9 g/dL — ABNORMAL LOW (ref 6.5–8.1)

## 2023-04-16 LAB — TROPONIN I (HIGH SENSITIVITY)
Troponin I (High Sensitivity): 120 ng/L (ref <18)
Troponin I (High Sensitivity): 195 ng/L (ref <18)

## 2023-04-16 LAB — CBC
HCT: 37.6 % (ref 36.0–46.0)
Hemoglobin: 12.2 g/dL (ref 12.0–15.0)
MCH: 28.3 pg (ref 26.0–34.0)
MCHC: 32.4 g/dL (ref 30.0–36.0)
MCV: 87.2 fL (ref 80.0–100.0)
Platelets: 121 10*3/uL — ABNORMAL LOW (ref 150–400)
RBC: 4.31 MIL/uL (ref 3.87–5.11)
RDW: 17.5 % — ABNORMAL HIGH (ref 11.5–15.5)
WBC: 7.4 10*3/uL (ref 4.0–10.5)
nRBC: 0 % (ref 0.0–0.2)

## 2023-04-16 LAB — OSMOLALITY, URINE: Osmolality, Ur: 694 mOsm/kg (ref 300–900)

## 2023-04-16 LAB — URINALYSIS, COMPLETE (UACMP) WITH MICROSCOPIC
Bilirubin Urine: NEGATIVE
Glucose, UA: >=500 mg/dL — AB
Hgb urine dipstick: NEGATIVE
Ketones, ur: NEGATIVE mg/dL
Leukocytes,Ua: NEGATIVE
Nitrite: NEGATIVE
Protein, ur: NEGATIVE mg/dL
Specific Gravity, Urine: 1.023 (ref 1.005–1.030)
pH: 5 (ref 5.0–8.0)

## 2023-04-16 LAB — SODIUM, URINE, RANDOM: Sodium, Ur: 12 mmol/L

## 2023-04-16 LAB — PHOSPHORUS
Phosphorus: 3.8 mg/dL (ref 2.5–4.6)
Phosphorus: 2.7 mg/dL (ref 2.5–4.6)

## 2023-04-16 LAB — TSH: TSH: 0.612 u[IU]/mL (ref 0.350–4.500)

## 2023-04-16 LAB — CREATININE, URINE, RANDOM: Creatinine, Urine: 72 mg/dL

## 2023-04-16 LAB — MAGNESIUM
Magnesium: 1.8 mg/dL (ref 1.7–2.4)
Magnesium: 1.8 mg/dL (ref 1.7–2.4)

## 2023-04-16 LAB — CK: Total CK: 15 U/L — ABNORMAL LOW (ref 38–234)

## 2023-04-16 LAB — PREALBUMIN: Prealbumin: <5 mg/dL — ABNORMAL LOW (ref 18–38)

## 2023-04-16 MED ORDER — ONDANSETRON HCL 4 MG PO TABS: 4.0000 mg | ORAL_TABLET | Freq: Four times a day (QID) | ORAL | Status: DC | PRN

## 2023-04-16 MED ORDER — APIXABAN 5 MG PO TABS
5.0000 mg | ORAL_TABLET | Freq: Two times a day (BID) | ORAL | Status: DC
  Administered 2023-04-16 (×3): 5 mg via ORAL
  Filled 2023-04-16 (×3): qty 1

## 2023-04-16 MED ORDER — DILTIAZEM LOAD VIA INFUSION
10.0000 mg | Freq: Once | INTRAVENOUS | Status: AC
  Administered 2023-04-16: 10 mg via INTRAVENOUS
  Filled 2023-04-16: qty 10

## 2023-04-16 MED ORDER — ACETAMINOPHEN 650 MG RE SUPP: 650.0000 mg | Freq: Four times a day (QID) | RECTAL | Status: DC | PRN

## 2023-04-16 MED ORDER — HYDROCODONE-ACETAMINOPHEN 5-325 MG PO TABS
1.0000 | ORAL_TABLET | ORAL | Status: DC | PRN
  Administered 2023-04-16 – 2023-04-17 (×3): 2 via ORAL
  Filled 2023-04-16 (×4): qty 2

## 2023-04-16 MED ORDER — SODIUM CHLORIDE 0.9 % IV SOLN: INTRAVENOUS | Status: AC

## 2023-04-16 MED ORDER — DILTIAZEM LOAD VIA INFUSION
15.0000 mg | Freq: Once | INTRAVENOUS | Status: AC
  Administered 2023-04-16: 15 mg via INTRAVENOUS

## 2023-04-16 MED ORDER — AMIODARONE HCL 200 MG PO TABS
100.0000 mg | ORAL_TABLET | Freq: Every day | ORAL | Status: DC
  Administered 2023-04-16: 100 mg via ORAL
  Filled 2023-04-16: qty 1

## 2023-04-16 MED ORDER — DILTIAZEM HCL-DEXTROSE 125-5 MG/125ML-% IV SOLN (PREMIX)
INTRAVENOUS | Status: AC
  Filled 2023-04-16: qty 125

## 2023-04-16 MED ORDER — DILTIAZEM HCL-DEXTROSE 125-5 MG/125ML-% IV SOLN (PREMIX)
5.0000 mg/h | INTRAVENOUS | Status: DC
  Administered 2023-04-16: 5 mg/h via INTRAVENOUS

## 2023-04-16 MED ORDER — PANTOPRAZOLE SODIUM 40 MG PO TBEC
40.0000 mg | DELAYED_RELEASE_TABLET | Freq: Every day | ORAL | Status: DC
  Administered 2023-04-16 – 2023-04-24 (×9): 40 mg via ORAL
  Filled 2023-04-16 (×9): qty 1

## 2023-04-16 MED ORDER — ONDANSETRON HCL 4 MG/2ML IJ SOLN: 4.0000 mg | Freq: Four times a day (QID) | INTRAMUSCULAR | Status: DC | PRN

## 2023-04-16 MED ORDER — ACETAMINOPHEN 325 MG PO TABS: 650.0000 mg | ORAL_TABLET | Freq: Four times a day (QID) | ORAL | Status: DC | PRN

## 2023-04-16 MED ORDER — AMIODARONE HCL 200 MG PO TABS: 200.0000 mg | ORAL_TABLET | Freq: Every day | ORAL | Status: DC

## 2023-04-16 MED ORDER — SODIUM CHLORIDE 0.9 % IV BOLUS
500.0000 mL | Freq: Once | INTRAVENOUS | Status: AC
  Administered 2023-04-16: 500 mL via INTRAVENOUS

## 2023-04-16 MED ORDER — TORSEMIDE 20 MG PO TABS
20.0000 mg | ORAL_TABLET | Freq: Every day | ORAL | Status: DC
  Administered 2023-04-16 – 2023-04-21 (×5): 20 mg via ORAL
  Filled 2023-04-16 (×6): qty 1

## 2023-04-16 MED ORDER — MORPHINE SULFATE (PF) 2 MG/ML IV SOLN
2.0000 mg | Freq: Once | INTRAVENOUS | Status: AC
  Administered 2023-04-16: 2 mg via INTRAVENOUS
  Filled 2023-04-16: qty 1

## 2023-04-16 MED ORDER — PRAVASTATIN SODIUM 40 MG PO TABS
40.0000 mg | ORAL_TABLET | Freq: Every evening | ORAL | Status: DC
  Administered 2023-04-16 – 2023-04-23 (×7): 40 mg via ORAL
  Filled 2023-04-16 (×8): qty 1

## 2023-04-16 MED ORDER — GUAIFENESIN ER 600 MG PO TB12: 600.0000 mg | ORAL_TABLET | Freq: Two times a day (BID) | ORAL | Status: DC

## 2023-04-16 MED ORDER — POTASSIUM CHLORIDE CRYS ER 20 MEQ PO TBCR
40.0000 meq | EXTENDED_RELEASE_TABLET | Freq: Once | ORAL | Status: AC
  Administered 2023-04-16: 40 meq via ORAL
  Filled 2023-04-16: qty 2

## 2023-04-16 MED ORDER — DAPAGLIFLOZIN PROPANEDIOL 10 MG PO TABS
10.0000 mg | ORAL_TABLET | Freq: Every day | ORAL | Status: DC
  Administered 2023-04-16 – 2023-04-24 (×9): 10 mg via ORAL
  Filled 2023-04-16 (×9): qty 1

## 2023-04-16 MED ORDER — SENNOSIDES-DOCUSATE SODIUM 8.6-50 MG PO TABS
1.0000 | ORAL_TABLET | Freq: Every day | ORAL | Status: DC
  Administered 2023-04-16 – 2023-04-24 (×8): 1 via ORAL
  Filled 2023-04-16 (×8): qty 1

## 2023-04-16 MED ORDER — SUCRALFATE 1 GM/10ML PO SUSP
1.0000 g | Freq: Three times a day (TID) | ORAL | Status: DC
  Administered 2023-04-16 – 2023-04-24 (×24): 1 g via ORAL
  Filled 2023-04-16 (×27): qty 10

## 2023-04-16 MED ORDER — LEVOTHYROXINE SODIUM 100 MCG PO TABS
100.0000 ug | ORAL_TABLET | Freq: Every day | ORAL | Status: DC
  Administered 2023-04-16 – 2023-04-24 (×9): 100 ug via ORAL
  Filled 2023-04-16 (×9): qty 1

## 2023-04-16 MED ORDER — METOPROLOL SUCCINATE ER 25 MG PO TB24
25.0000 mg | ORAL_TABLET | Freq: Every day | ORAL | Status: DC
  Administered 2023-04-16: 25 mg via ORAL
  Filled 2023-04-16 (×2): qty 1

## 2023-04-16 MED ORDER — SODIUM CHLORIDE 0.9% FLUSH
3.0000 mL | Freq: Two times a day (BID) | INTRAVENOUS | Status: DC
  Administered 2023-04-16 – 2023-04-24 (×12): 3 mL via INTRAVENOUS

## 2023-04-16 NOTE — Assessment & Plan Note (Signed)
Patient had a fall last week resulting in severe sacral pain.  Will obtain plain images to start with. Pain is interfering with patient ambulation.  Will need PT OT assessment

## 2023-04-16 NOTE — Progress Notes (Signed)
Pt admitted to rm 24 from ED. CHG wipe given. Initiated tele. VSS. Oriented pt to the unit. Call bell within reach.   Lawson Radar, RN

## 2023-04-16 NOTE — ED Notes (Signed)
No fecal impaction .

## 2023-04-16 NOTE — Assessment & Plan Note (Addendum)
Continue with metoprolol 50 mg po bid.

## 2023-04-16 NOTE — Assessment & Plan Note (Deleted)
Echocardiogram with preserved LV systolic function with EF 60 to 65%, moderate LVH, RV systolic function preserved, RVSP 41.4 mmHg. Sp TAVR, LA with moderate dilatation, RA normal size, no pericardial effusion, no significant valvular disease.   Continue with SGLT 2 inh and metoprolol.   Diuresis with torsemide 20 mg po daily.

## 2023-04-16 NOTE — Assessment & Plan Note (Signed)
Follow up as outpatient.  

## 2023-04-16 NOTE — ED Notes (Signed)
ED TO INPATIENT HANDOFF REPORT  ED Nurse Name and Phone #: [phone]  S Name/Age/Gender Zoe [name] 89 y.o. female Room/Bed: TRACC/TRACC  Code Status   Code Status: Full Code  Home/SNF/Other Home Patient oriented to: self, place, time, and situation Is this baseline? Yes   Triage Complete: Triage complete  Chief Complaint A-fib [facility]) [I48.91]  Triage Note Pt BIB EMS due to afib RVR and hypotension. Pt having chest pain since and and SHOB. EMS states BP was 85 systolic, HR 167. Axox4. EMS gave 2.5 of versed and [address] for casrdioverrsion.    Allergies Allergies  Allergen Reactions   Penicillins Other (See Comments)    UNSPECIFIED REACTION  Patient does not remember reaction.  Has patient had a PCN reaction causing immediate rash, facial/tongue/throat swelling, SOB or lightheadedness with hypotension: no Has patient had a PCN reaction causing severe rash involving mucus membranes or skin necrosis: no Has patient had a PCN reaction that required hospitalization no Has patient had a PCN reaction occurring within the last 10 years: no If all of the above answers are "NO", then may proceed with Cephalosporin use.    Sulfa Antibiotics Other (See Comments)    UNSPECIFIED REACTION  "maybe vision issues? "    Level of Care/Admitting Diagnosis ED Disposition     ED Disposition  Admit   Condition  --   Comment  Hospital Area: [facility] [[id number]]  Level of Care: Progressive [102]  Admit to Progressive based on following criteria: CARDIOVASCULAR & THORACIC of moderate stability with acute coronary syndrome symptoms/low risk myocardial infarction/hypertensive urgency/arrhythmias/heart failure potentially compromising stability and stable post cardiovascular intervention patients.  May place patient in observation at [facility] or [facility] if equivalent level of care is available:: No  Covid Evaluation: Asymptomatic - no recent exposure (last 10  days) testing not required  Diagnosis: A-fib [facility]) [[id number]]  Admitting Physician: Therisa Doyne [[id number]]  Attending Physician: Therisa Doyne [[id number]]          B Medical/Surgery History Past Medical History:  Diagnosis Date   Arthritis    some - per patient   Breast cancer (HCC)    breast cancer / left    Cataract    bilat    GERD (gastroesophageal reflux disease)    History of kidney stones    Hyperlipidemia    Hypertension    Hypothyroidism    Macular degeneration    Left   S/P TAVR (transcatheter aortic valve replacement) 09/03/2018   23 mm Edwards Sapien 3 transcatheter heart valve placed via percutaneous right transfemoral approach    Severe aortic stenosis    Stress incontinence    Thyroid disease    Tinnitus    Past Surgical History:  Procedure Laterality Date   ABDOMINAL HYSTERECTOMY  1970's   BACK SURGERY     BREAST LUMPECTOMY  12/1998   lumpectomy   CARDIAC CATHETERIZATION     EYE SURGERY     cataract surgery bilat    INTRAOPERATIVE TRANSTHORACIC ECHOCARDIOGRAM N/A 09/03/2018   Procedure: INTRAOPERATIVE TRANSTHORACIC ECHOCARDIOGRAM;  Surgeon: Kathleene Hazel, MD;  Location: [facility] OR;  Service: Open Heart Surgery;  Laterality: N/A;   KYPHOPLASTY N/A 09/07/2022   Procedure: THORACIC EIGHT KYPHOPLASTY;  Surgeon: Venita Lick, MD;  Location: [facility] OR;  Service: Orthopedics;  Laterality: N/A;  1 hr Local with [facility] 3 C-Bed   LITHOTRIPSY     Right total knee     2018 Dr. Charlann Boxer   RIGHT/LEFT  HEART CATH AND CORONARY ANGIOGRAPHY N/A 08/06/2018   Procedure: RIGHT/LEFT HEART CATH AND CORONARY ANGIOGRAPHY;  Surgeon: Yates Decamp, MD;  Location: [facility] INVASIVE CV LAB;  Service: Cardiovascular;  Laterality: N/A;   THYROIDECTOMY, PARTIAL  1975   TONSILLECTOMY     as a child - patient not sure of exact date   TOTAL KNEE ARTHROPLASTY Left 03/13/2016   Procedure: TOTAL KNEE ARTHROPLASTY;  Surgeon: Durene Romans, MD;  Location: [facility] ORS;  Service: Orthopedics;   Laterality: Left;   TOTAL KNEE ARTHROPLASTY Right 06/18/2017   Procedure: RIGHT TOTAL KNEE ARTHROPLASTY;  Surgeon: Durene Romans, MD;  Location: [facility] ORS;  Service: Orthopedics;  Laterality: Right;   TRANSCATHETER AORTIC VALVE REPLACEMENT, TRANSFEMORAL N/A 09/03/2018   Procedure: TRANSCATHETER AORTIC VALVE REPLACEMENT, TRANSFEMORAL;  Surgeon: Kathleene Hazel, MD;  Location: [facility] OR;  Service: Open Heart Surgery;  Laterality: N/A;     A IV Location/Drains/Wounds Patient Lines/Drains/Airways Status     Active Line/Drains/Airways     Name Placement date Placement time Site Days   Peripheral IV 04/15/23 20 G Anterior;Right Hand 04/15/23  1800  Hand  1   Peripheral IV 04/16/23 22 G Right Wrist 04/16/23  0012  Wrist  less than 1            Intake/Output Last 24 hours  Intake/Output Summary (Last 24 hours) at 04/16/2023 0942 Last data filed at 04/16/2023 0254 Gross per 24 hour  Intake 1000 ml  Output --  Net 1000 ml    Labs/Imaging Results for orders placed or performed during the hospital encounter of 04/15/23 (from the past 48 hour(s))  Basic metabolic panel     Status: Abnormal   Collection Time: 04/15/23  6:05 PM  Result Value Ref Range   Sodium 133 (L) 135 - 145 mmol/L   Potassium 3.5 3.5 - 5.1 mmol/L   Chloride 93 (L) 98 - 111 mmol/L   CO2 25 22 - 32 mmol/L   Glucose, Bld 109 (H) 70 - 99 mg/dL    Comment: Glucose reference range applies only to samples taken after fasting for at least 8 hours.   BUN 33 (H) 8 - 23 mg/dL   Creatinine, Ser 1.19 0.44 - 1.00 mg/dL   Calcium 7.9 (L) 8.9 - 10.3 mg/dL   GFR, Estimated 55 (L) >60 mL/min    Comment: (NOTE) Calculated using the CKD-EPI Creatinine Equation (2021)    Anion gap 15 5 - 15    Comment: Performed at [facility], [address] [place], [place] [address]  Troponin I (High Sensitivity)     Status: Abnormal   Collection Time: 04/15/23  6:05 PM  Result Value Ref Range   Troponin I (High Sensitivity) 263 (HH)  <18 ng/L    Comment: CRITICAL RESULT CALLED TO, READ BACK BY AND VERIFIED WITH SANGALANG,R RN @2116  04/15/23 SATRAINR (NOTE) Elevated high sensitivity troponin I (hsTnI) values and significant  changes across serial measurements may suggest ACS but many other  chronic and acute conditions are known to elevate hsTnI results.  Refer to the "Links" section for chest pain algorithms and additional  guidance. Performed at [facility], [address] [place], [place] [address]   CBC     Status: Abnormal   Collection Time: 04/15/23  6:05 PM  Result Value Ref Range   WBC 9.8 4.0 - 10.5 K/uL   RBC 4.78 3.87 - 5.11 MIL/uL   Hemoglobin 13.4 12.0 - 15.0 g/dL   HCT 30.8 65.7 -  46.0 %   MCV 86.2 80.0 - 100.0 fL   MCH 28.0 26.0 - 34.0 pg   MCHC 32.5 30.0 - 36.0 g/dL   RDW 01.0 (H) 27.2 - 53.6 %   Platelets 184 150 - 400 K/uL   nRBC 0.0 0.0 - 0.2 %    Comment: Performed at [facility] Lab, [address], [place], [place] [address]  CBG monitoring, ED     Status: Abnormal   Collection Time: 04/15/23  6:31 PM  Result Value Ref Range   Glucose-Capillary 111 (H) 70 - 99 mg/dL    Comment: Glucose reference range applies only to samples taken after fasting for at least 8 hours.  Troponin I (High Sensitivity)     Status: Abnormal   Collection Time: 04/15/23  9:45 PM  Result Value Ref Range   Troponin I (High Sensitivity) 255 (HH) <18 ng/L    Comment: CRITICAL VALUE NOTED. VALUE IS CONSISTENT WITH PREVIOUSLY REPORTED/CALLED VALUE (NOTE) Elevated high sensitivity troponin I (hsTnI) values and significant  changes across serial measurements may suggest ACS but many other  chronic and acute conditions are known to elevate hsTnI results.  Refer to the "Links" section for chest pain algorithms and additional  guidance. Performed at [facility], [address] [place], [place] [address]   CK     Status: Abnormal   Collection Time: 04/15/23  9:45 PM  Result Value Ref Range   Total  CK 15 (L) 38 - 234 U/L    Comment: Performed at [facility], [address] [place], [place] [address]  Magnesium     Status: None   Collection Time: 04/15/23  9:45 PM  Result Value Ref Range   Magnesium 1.8 1.7 - 2.4 mg/dL    Comment: Performed at [facility], [address] [place], [place] [address]  Phosphorus     Status: None   Collection Time: 04/15/23  9:45 PM  Result Value Ref Range   Phosphorus 3.8 2.5 - 4.6 mg/dL    Comment: Performed at [facility], [address] [place], [place] [address]  Osmolality     Status: None   Collection Time: 04/15/23  9:45 PM  Result Value Ref Range   Osmolality 285 275 - 295 mOsm/kg    Comment: Performed at [facility], [address], [place], [place] [address]  Hepatic function panel     Status: Abnormal   Collection Time: 04/15/23  9:45 PM  Result Value Ref Range   Total Protein 4.9 (L) 6.5 - 8.1 g/dL   Albumin 2.4 (L) 3.5 - 5.0 g/dL   AST 17 15 - 41 U/L   ALT 19 0 - 44 U/L   Alkaline Phosphatase 69 38 - 126 U/L   Total Bilirubin 0.8 0.3 - 1.2 mg/dL   Bilirubin, Direct 0.2 0.0 - 0.2 mg/dL   Indirect Bilirubin 0.6 0.3 - 0.9 mg/dL    Comment: Performed at [facility] Lab, [address], [place], [place] [address]  TSH     Status: None   Collection Time: 04/15/23  9:45 PM  Result Value Ref Range   TSH 0.612 0.350 - 4.500 uIU/mL    Comment: Performed by a 3rd Generation assay with a functional sensitivity of <=0.01 uIU/mL. Performed at [facility] Lab, [address], [place], [place] [address]   CBC with Differential/Platelet     Status: Abnormal   Collection Time: 04/15/23  9:45 PM  Result Value Ref Range   WBC 10.5 4.0 - 10.5 K/uL   RBC 4.61 3.87 - 5.11 MIL/uL   Hemoglobin 13.2 12.0 - 15.0 g/dL   HCT 16.1 09.6 - 04.5 %   MCV 89.4 80.0 - 100.0 fL   MCH 28.6 26.0 - 34.0 pg   MCHC 32.0 30.0 - 36.0 g/dL   RDW 40.9 (H) 81.1 - 91.4 %   Platelets 169 150 - 400 K/uL   nRBC 0.0 0.0 - 0.2 %    Neutrophils Relative % 70 %   Neutro Abs 7.4 1.7 - 7.7 K/uL   Lymphocytes Relative 6 %   Lymphs Abs 0.6 (L) 0.7 - 4.0 K/uL   Monocytes Relative 21 %   Monocytes Absolute 2.2 (H) 0.1 - 1.0 K/uL   Eosinophils Relative 1 %   Eosinophils Absolute 0.2 0.0 - 0.5 K/uL   Basophils Relative 1 %   Basophils Absolute 0.1 0.0 - 0.1 K/uL   Immature Granulocytes 1 %   Abs Immature Granulocytes 0.06 0.00 - 0.07 K/uL    Comment: Performed at [facility], [address] [place], [place] [address]  Lactic acid, plasma     Status: None   Collection Time: 04/15/23 10:59 PM  Result Value Ref Range   Lactic Acid, Venous 1.6 0.5 - 1.9 mmol/L    Comment: Performed at [facility], [address], [place], [place] [address]  Prealbumin     Status: Abnormal   Collection Time: 04/16/23 12:10 AM  Result Value Ref Range   Prealbumin <5 (L) 18 - 38 mg/dL    Comment: Performed at [facility], [address], [place], [place] [address]  Troponin I (High Sensitivity)     Status: Abnormal   Collection Time: 04/16/23 12:10 AM  Result Value Ref Range   Troponin I (High Sensitivity) 120 (HH) <18 ng/L    Comment: CRITICAL VALUE NOTED. VALUE IS CONSISTENT WITH PREVIOUSLY REPORTED/CALLED VALUE (NOTE) Elevated high sensitivity troponin I (hsTnI) values and significant  changes across serial measurements may suggest ACS but many other  chronic and acute conditions are known to elevate hsTnI results.  Refer to the "Links" section for chest pain algorithms and additional  guidance. Performed at [facility], [address], [place], [place] [address]   Troponin I (High Sensitivity)     Status: Abnormal   Collection Time: 04/16/23  2:20 AM  Result Value Ref Range   Troponin I (High Sensitivity) 195 (HH) <18 ng/L    Comment: CRITICAL VALUE NOTED. VALUE IS CONSISTENT WITH PREVIOUSLY REPORTED/CALLED VALUE (NOTE) Elevated high sensitivity troponin I (hsTnI) values and significant  changes  across serial measurements may suggest ACS but many other  chronic and acute conditions are known to elevate hsTnI results.  Refer to the "Links" section for chest pain algorithms and additional  guidance. Performed at [facility] Lab, [address], [place], [place] [address]   Comprehensive metabolic panel     Status: Abnormal   Collection Time: 04/16/23  4:00 AM  Result Value Ref Range   Sodium 134 (L) 135 - 145 mmol/L   Potassium 3.5 3.5 - 5.1 mmol/L   Chloride 95 (L) 98 - 111 mmol/L   CO2 26 22 - 32 mmol/L   Glucose, Bld 112 (H) 70 - 99 mg/dL    Comment: Glucose reference range applies only to samples taken after fasting for at least 8 hours.   BUN  29 (H) 8 - 23 mg/dL   Creatinine, Ser 1.61 0.44 - 1.00 mg/dL   Calcium 7.8 (L) 8.9 - 10.3 mg/dL   Total Protein 4.6 (L) 6.5 - 8.1 g/dL   Albumin 2.3 (L) 3.5 - 5.0 g/dL   AST 15 15 - 41 U/L   ALT 18 0 - 44 U/L   Alkaline Phosphatase 64 38 - 126 U/L   Total Bilirubin 1.1 0.3 - 1.2 mg/dL   GFR, Estimated >09 >60 mL/min    Comment: (NOTE) Calculated using the CKD-EPI Creatinine Equation (2021)    Anion gap 13 5 - 15    Comment: Performed at [facility] Lab, [address] [place], [place] [address]  CBC     Status: Abnormal   Collection Time: 04/16/23  4:00 AM  Result Value Ref Range   WBC 7.4 4.0 - 10.5 K/uL   RBC 4.31 3.87 - 5.11 MIL/uL   Hemoglobin 12.2 12.0 - 15.0 g/dL   HCT 81.1 91.4 - 78.2 %   MCV 87.2 80.0 - 100.0 fL   MCH 28.3 26.0 - 34.0 pg   MCHC 32.4 30.0 - 36.0 g/dL   RDW 95.6 (H) 21.3 - 08.6 %   Platelets 121 (L) 150 - 400 K/uL    Comment: REPEATED TO VERIFY   nRBC 0.0 0.0 - 0.2 %    Comment: Performed at [facility] Lab, [address] [place], [place] [address]  Creatinine, urine, random     Status: None   Collection Time: 04/16/23  4:20 AM  Result Value Ref Range   Creatinine, Urine 72 mg/dL    Comment: Performed at [facility], [address] [place], [place] [address]  Osmolality,  urine     Status: None   Collection Time: 04/16/23  4:20 AM  Result Value Ref Range   Osmolality, Ur 694 300 - 900 mOsm/kg    Comment: Performed at [facility], [address], [place], [place] [address]  Sodium, urine, random     Status: None   Collection Time: 04/16/23  4:20 AM  Result Value Ref Range   Sodium, Ur 12 mmol/L    Comment: Performed at [name] Lab, [address], [place], [place] [address]  Urinalysis, Complete w Microscopic -Urine, Clean Catch     Status: Abnormal   Collection Time: 04/16/23  4:20 AM  Result Value Ref Range   Color, Urine YELLOW YELLOW   APPearance CLEAR CLEAR   Specific Gravity, Urine 1.023 1.005 - 1.030   pH 5.0 5.0 - 8.0   Glucose, UA >=500 (A) NEGATIVE mg/dL   Hgb urine dipstick NEGATIVE NEGATIVE   Bilirubin Urine NEGATIVE NEGATIVE   Ketones, ur NEGATIVE NEGATIVE mg/dL   Protein, ur NEGATIVE NEGATIVE mg/dL   Nitrite NEGATIVE NEGATIVE   Leukocytes,Ua NEGATIVE NEGATIVE   RBC / HPF 6-10 0 - 5 RBC/hpf   WBC, UA 0-5 0 - 5 WBC/hpf   Bacteria, UA RARE (A) NONE SEEN   Squamous Epithelial / HPF 0-5 0 - 5 /HPF   Mucus PRESENT    Hyaline Casts, UA PRESENT     Comment: Performed at [facility] Lab, [address] [place], [place] [address]   DG HIPS BILAT WITH PELVIS 2V  Result Date: 04/16/2023 CLINICAL DATA:  Status post fall 5 days ago with subsequent right hip pain. EXAM: DG HIP (WITH OR WITHOUT PELVIS) 2V BILAT COMPARISON:  None Available. FINDINGS: There is no evidence  of hip fracture or dislocation. Moderate to marked severity degenerative changes are seen involving both hips, in the form of joint space narrowing and acetabular sclerosis. IMPRESSION: Degenerative changes involving both hips, without evidence of an acute osseous abnormality. Electronically Signed   By: Aram Candela M.D.   On: 04/16/2023 01:06   DG Chest Portable 1 View  Result Date: 04/15/2023 CLINICAL DATA:  Atrial fibrillation EXAM: PORTABLE CHEST 1 VIEW  COMPARISON:  04/13/2023 FINDINGS: Stable cardiomediastinal silhouette. TAVR. Aortic atherosclerotic calcification. Bibasilar atelectasis. No pleural effusion or pneumothorax. IMPRESSION: Stable exam since 04/13/2023.  Bibasilar atelectasis. Electronically Signed   By: Minerva Fester M.D.   On: 04/15/2023 19:04    Pending Labs [employer] (From admission, onward)     Start     Ordered   Signed and Held  Magnesium  Tomorrow morning,   R        Signed and Held   Signed and Held  Phosphorus  Tomorrow morning,   R        Signed and Held            Vitals/Pain Today's Vitals   04/16/23 0815 04/16/23 0830 04/16/23 0831 04/16/23 0835  BP: 103/77 115/65    Pulse: 69 74 69 73  Resp: 14 (!) 24 18 17   Temp:      TempSrc:      SpO2: 92% 94% 93% 93%  Weight:      Height:      PainSc:        Isolation Precautions No active isolations  Medications Medications  amiodarone (PACERONE) tablet 100 mg (has no administration in time range)  apixaban (ELIQUIS) tablet 5 mg (5 mg Oral Given 04/16/23 0353)  pantoprazole (PROTONIX) EC tablet 40 mg (has no administration in time range)  levothyroxine (SYNTHROID) tablet 100 mcg (100 mcg Oral Given 04/16/23 0522)  pravastatin (PRAVACHOL) tablet 40 mg (has no administration in time range)  senna-docusate (Senokot-S) tablet 1 tablet (has no administration in time range)  0.9 %  sodium chloride infusion ( Intravenous New Bag/Given 04/16/23 0346)  acetaminophen (TYLENOL) tablet 650 mg (has no administration in time range)    Or  acetaminophen (TYLENOL) suppository 650 mg (has no administration in time range)  HYDROcodone-acetaminophen (NORCO/VICODIN) 5-325 MG per tablet 1-2 tablet (2 tablets Oral Given 04/16/23 0351)  ondansetron (ZOFRAN) tablet 4 mg (has no administration in time range)    Or  ondansetron (ZOFRAN) injection 4 mg (has no administration in time range)  aspirin chewable tablet 324 mg (324 mg Oral Given 04/15/23 1832)  sodium chloride  0.9 % bolus 500 mL (0 mLs Intravenous Stopped 04/15/23 2235)  sodium chloride 0.9 % bolus 500 mL (0 mLs Intravenous Stopped 04/16/23 0254)    Mobility walks with device     Focused Assessments    R Recommendations: See Admitting Provider Note  Report given to:   Additional Notes:

## 2023-04-16 NOTE — Evaluation (Signed)
Physical Therapy Evaluation Patient Details Name: Zoe [name] MRN: [id number] DOB: 24-Jul-1934 Today's Date: 04/16/2023  History of Present Illness  Zoe [name] is a 89 y.o. female Presented 04/15/23 with fast heart rate. +afib with RVR; cardioversion attempted; reports sacral pain s/p recent fall in garden; reports a second recent fall due to Rt knee buckling.  PMH significant of A-fib on Eliquis and amiodarone, hypothyroidism, HTN, breast cancer, multiple myeloma,aortic stenosis status post TAVR, hx of TIA  Clinical Impression   Pt admitted secondary to problem above with deficits below. PTA patient was living in one level condominium with one step to enter and using a rollator to ambulate. She reports 2 recent falls: 1) in the community garden lost her balance, 2) Rt knee buckled and fell. She has a grandson that lives with her, however he is a Consulting civil engineer at [employer] and she is alone most of the time.  Pt currently requires min assist to come to stand and min assist with ambulation with cuing for safe use of RW (especially for proximity as she has had knee buckling previously). Patient notes her balance and strength are not good and eager to work with therapy to improve. Agrees with post-acute therapy at facility with <3 hours therapy per day with plan for home from there after she is more independent.  Anticipate patient will benefit from PT to address problems listed below.Will continue to follow acutely to maximize functional mobility independence and safety.          Recommendations for follow up therapy are one component of a multi-disciplinary discharge planning process, led by the attending physician.  Recommendations may be updated based on patient status, additional functional criteria and insurance authorization.  Follow Up Recommendations Can patient physically be transported by private vehicle: Yes     Assistance Recommended at Discharge Intermittent Supervision/Assistance   Patient can return home with the following  A little help with walking and/or transfers;A little help with bathing/dressing/bathroom;Assistance with cooking/housework;Direct supervision/assist for medications management;Direct supervision/assist for financial management;Assist for transportation;Help with stairs or ramp for entrance    Equipment Recommendations None recommended by PT  Recommendations for Other Services  OT consult    Functional Status Assessment Patient has had a recent decline in their functional status and demonstrates the ability to make significant improvements in function in a reasonable and predictable amount of time.     Precautions / Restrictions Precautions Precautions: Fall Precaution Comments: 2 recent falls; RLE buckling      Mobility  Bed Mobility               General bed mobility comments: sitting on EOB on arrival (with NT)    Transfers Overall transfer level: Needs assistance Equipment used: Rolling walker (2 wheels) Transfers: Sit to/from Stand Sit to Stand: Min assist           General transfer comment: pt attempted x 2 pushing off the bed without success; ultimately PT anchored RW and she used one hand on RW to come to stand    Ambulation/Gait Ambulation/Gait assistance: Min guard Gait Distance (Feet): 90 Feet Assistive device: Rolling walker (2 wheels) Gait Pattern/deviations: Step-through pattern, Decreased stride length   Gait velocity interpretation: <1.8 ft/sec, indicate of risk for recurrent falls   General Gait Details: walks very guarded and fearful of Rt knee buckling; all ambulation done in her room (near seats to rest if needed)  Careers information officer  Modified Rankin (Stroke Patients Only)       Balance Overall balance assessment: History of Falls                                           Pertinent Vitals/Pain Pain Assessment Pain Assessment: Faces Faces  Pain Scale: Hurts little more Pain Location: Rt thigh Pain Descriptors / Indicators: Aching, Discomfort Pain Intervention(s): Limited activity within patient's tolerance, Monitored during session    Home Living Family/patient expects to be discharged to:: Skilled nursing facility Living Arrangements: Other relatives (grandson (in school [employer])) Available Help at Discharge: Family;Available PRN/intermittently Type of Home: House (Condo) Home Access: Stairs to enter Entrance Stairs-Rails: None Entrance Stairs-Number of Steps: 1   Home Layout: One level Home Equipment: Shower seat;Cane - single Librarian, academic (2 wheels);Rollator (4 wheels);Toilet riser      Prior Function Prior Level of Function : Needs assist             Mobility Comments: Rollator for ambulation, grandson provide transportation ADLs Comments: grandson assists with housework     Hand Dominance        Extremity/Trunk Assessment   Upper Extremity Assessment Upper Extremity Assessment: Generalized weakness    Lower Extremity Assessment Lower Extremity Assessment: RLE deficits/detail;LLE deficits/detail RLE Deficits / Details: knee extension 3+, ankle DF 5/5; pt reports nerve pain that starts at top of thigh and travels downward LLE Deficits / Details: knee extension 4/5; ankle DF 5/5    Cervical / Trunk Assessment Cervical / Trunk Assessment: Other exceptions Cervical / Trunk Exceptions: overweight  Communication   Communication: HOH  Cognition Arousal/Alertness: Awake/alert Behavior During Therapy: WFL for tasks assessed/performed Overall Cognitive Status: No family/caregiver present to determine baseline cognitive functioning                                 General Comments: pt repeated same story about her fall as if she had not yet told me        General Comments      Exercises     Assessment/Plan    PT Assessment Patient needs continued PT services  PT Problem  List Decreased strength;Decreased balance;Decreased mobility;Decreased cognition;Decreased knowledge of use of DME;Obesity;Pain       PT Treatment Interventions DME instruction;Gait training;Stair training;Functional mobility training;Therapeutic activities;Therapeutic exercise;Balance training;Cognitive remediation;Patient/family education    PT Goals (Current goals can be found in the Care Plan section)  Acute Rehab PT Goals Patient Stated Goal: work with PT to get stronger and improve balance PT Goal Formulation: With patient Time For Goal Achievement: 04/30/23 Potential to Achieve Goals: Good    Frequency Min 1X/week     Co-evaluation               AM-PAC PT "6 Clicks" Mobility  Outcome Measure Help needed turning from your back to your side while in a flat bed without using bedrails?: None Help needed moving from lying on your back to sitting on the side of a flat bed without using bedrails?: A Little Help needed moving to and from a bed to a chair (including a wheelchair)?: A Little Help needed standing up from a chair using your arms (e.g., wheelchair or bedside chair)?: A Little Help needed to walk in hospital room?: A Little Help needed climbing 3-5 steps with a railing? :  A Lot 6 Click Score: 18    End of Session Equipment Utilized During Treatment: Gait belt Activity Tolerance: Patient tolerated treatment well Patient left: in chair;with call bell/phone within reach;with chair alarm set;with family/visitor present Nurse Communication: Mobility status;Other (comment) (d/c recommendation for SNF) PT Visit Diagnosis: History of falling (Z91.81);Difficulty in walking, not elsewhere classified (R26.2);Pain Pain - Right/Left: Right Pain - part of body: Leg    Time: 1610-9604 PT Time Calculation (min) (ACUTE ONLY): 29 min   Charges:   PT Evaluation $PT Eval Low Complexity: 1 Low PT Treatments $Gait Training: 8-22 mins         Jerolyn Center, PT Acute  Rehabilitation Services  Office [phone]   Zena Amos 04/16/2023, 3:48 PM

## 2023-04-16 NOTE — Assessment & Plan Note (Signed)
Chronic stable patient is followed by Dr. Leonides Schanz

## 2023-04-16 NOTE — Telephone Encounter (Signed)
Could be, I saw her and will know more tomorrow setting up for cath tomorrow

## 2023-04-16 NOTE — Progress Notes (Signed)
  X-cover Note: Bedside RN communicates pt back in rapid afib with HR 140s. Will restart IV cardizem.  Carollee Herter, DO [facility]

## 2023-04-16 NOTE — Telephone Encounter (Signed)
Daughter called back....made aware

## 2023-04-16 NOTE — Assessment & Plan Note (Addendum)
Sp 2 inpatient cardioversion.  Telemetry continue with sinus bradycardia with mean heart rate 60 bpm.   Plan to continue with digoxin, metoprolol 50 mg bid, diltiazem 120 mg and amiodarone 200 mg daily.   Check digoxin level in 3 days.   Anticoagulation with apixaban.  Out of bed to chair tid with meals Pt and Ot.   Elevated cardiac markers elevated, high sensitive troponin, possible due to RVR.  Old records personally reviewed 2019 coronary angiography with normal coronaries.

## 2023-04-16 NOTE — Progress Notes (Signed)
  Progress Note   Patient: Zoe [name] ZOX:[id number] DOB: [birth year] DOA: 04/15/2023     0 DOS: the patient was seen and examined on 04/16/2023   Brief hospital course: Zoe [name] was admitted to the hospital with the working diagnosis of atrial fibrillation with rapid ventricular response.   89 yo female with the past medical history of atrial fibrillation, aortic stenosis sp TAVR, hypothyroid, hypertension, multiple myeloma who presented with palpitations. Reported dyspnea for 2 days. She had acute onset of palpitations, her blood pressure was 90/50 and HR 155, EMS was called and it was attempted direct current cardioversion 200 J, with converting to sinus rhythm.   Assessment and Plan: * Paroxysmal atrial fibrillation (HCC) Patient has converted to sinus rhythm.   Plan to continue telemetry monitoring. Continue amiodarone for rate and rhythm control. Anticoagulation with apixaban.  Out of bed to chair tid with meals Pt and Ot.   Elevated cardiac markers elevated, high sensitive troponin is trending down. Possible elevation due to RVR.  Old records personally reviewed 2019 coronary angiography with normal coronaries.   Chronic diastolic CHF (congestive heart failure) (HCC) No signs of acute decompensation.   Continue amiodarone for atrial fibrillation rate and rhythm control.  Resume SGLT 2 inh and metoprolol.  Hold on furosemide for now.    Hyponatremia Renal function stable with serum cr at 0,91 with K at 3,5 and serum bicarbonate at 26. Na 134.   Plan to add 40 meq Kcl and follow up electrolytes in am, including Mg.  Continue holding furosemide for now.   Hypothyroidism Continue levothyroxine.  Hypertension Continue metoprolol for blood pressure control.   Hyperlipidemia Continue with statin therapy  History of stroke Continue anticoagulation with apixaban.   Multiple myeloma not having achieved remission (HCC) Follow up as outpatient.          Subjective: Patient with no chest pain or dyspnea, no palpitations.   Physical Exam: Vitals:   04/16/23 0945 04/16/23 1000 04/16/23 1001 04/16/23 1054  BP: (!) 115/54 (!) 114/49  (!) 141/63  Pulse: 69 68 69 62  Resp: (!) 23 19 17 18   Temp:    98.8 F (37.1 C)  TempSrc:    Oral  SpO2: 93% 93% 94% 94%  Weight:      Height:       Neurology awake and alert ENT with no pallor Cardiovascular with S1` and S2 present and regular with no gallops, rubs or murmurs No JVD No lower extremity edema Respiratory with no rales or wheezing, no rhonchi Abdomen with no distention   Data Reviewed:    Family Communication: no family at the bedside   Disposition: Status is: Observation The patient remains OBS appropriate and will d/c before 2 midnights.  Planned Discharge Destination: Home    Author: Coralie Keens, MD 04/16/2023 11:11 AM  For on call review [email or web address].

## 2023-04-16 NOTE — Consult Note (Signed)
CARDIOLOGY CONSULT NOTE  Patient ID: RICHETTA CUBILLOS MRN: [id number] DOB/AGE: 1934/03/14 89 y.o.  Admit date: 04/15/2023 Referring Physician  Erin Hearing, MD Primary Physician:  Irena Reichmann, DO Reason for Consultation  A. Fib, Chest pain  Patient ID: [name], female    DOB: Mar 24, 1934, 89 y.o.   MRN: [id number]  Chief Complaint  Patient presents with   Atrial Fibrillation   HPI:    [name]  is a 89 y.o. Caucasian female with history of possibly mild coronary spasm by coronary angiogram in 2014,  severe AS s/p TAVR by Dr. Cornelius Moras on 09/03/2018, asymptomatic bilateral carotid artery stenosis,  essential hypertension, hyperlipidemia, hyperglycemia and GERD. She underwent T8 kyphoplasty on 09/07/2022 without any postprocedural cardiac complications.  Patient also going through chemotherapy, presently diagnosed with multiple myeloma.   Admitted on 03/01/2023 with A-fib with RVR and has been on amiodarone and Eliquis.She was also seen in the emergency room on 04/13/2023 with chest pain and mild heart failure with mildly elevated serum troponin with normal EKG hence I factor close related to mild diastolic heart failure and was discharged home.  She is admitted to the hospital with recurrence of A-fib with RVR needing cardioversion by EMS due to low blood pressure.  Patient states that after she got discharged from the emergency room she was doing fairly well, but started developing severe chest pain, rapid palpitations and dizzy spells, she called her Neighbors who found her to be borderline hypotensive around systolic 90 mmHg, EMS was activated in view of elevated heart rate and presence of atrial fibrillation by home monitoring and en route to the emergency room, she received IV Versed and cardioversion to sinus rhythm.  Patient states that she feels well since cardioversion.  Continues to have on and off chest pain, described as sharp pain in the middle of the chest on  and off, lasting several minutes worse on taking deep breath and sometimes worse on coughing.  He also has chronic dyspnea.  No hemoptysis.  No leg edema.  She has been on anticoagulation without any missed doses.  Past Medical History:  Diagnosis Date   Arthritis    some - per patient   Breast cancer (HCC)    breast cancer / left    Cataract    bilat    GERD (gastroesophageal reflux disease)    History of kidney stones    Hyperlipidemia    Hypertension    Hypothyroidism    Macular degeneration    Left   S/P TAVR (transcatheter aortic valve replacement) 09/03/2018   23 mm Edwards Sapien 3 transcatheter heart valve placed via percutaneous right transfemoral approach    Severe aortic stenosis    Stress incontinence    Thyroid disease    Tinnitus    Past Surgical History:  Procedure Laterality Date   ABDOMINAL HYSTERECTOMY  1970's   BACK SURGERY     BREAST LUMPECTOMY  12/1998   lumpectomy   CARDIAC CATHETERIZATION     EYE SURGERY     cataract surgery bilat    INTRAOPERATIVE TRANSTHORACIC ECHOCARDIOGRAM N/A 09/03/2018   Procedure: INTRAOPERATIVE TRANSTHORACIC ECHOCARDIOGRAM;  Surgeon: Kathleene Hazel, MD;  Location: [facility] OR;  Service: Open Heart Surgery;  Laterality: N/A;   KYPHOPLASTY N/A 09/07/2022   Procedure: THORACIC EIGHT KYPHOPLASTY;  Surgeon: Venita Lick, MD;  Location: [facility] OR;  Service: Orthopedics;  Laterality: N/A;  1 hr Local with IV [facility] 3 C-Bed   LITHOTRIPSY     Right  total knee     2018 Dr. Charlann Boxer   RIGHT/LEFT HEART CATH AND CORONARY ANGIOGRAPHY N/A 08/06/2018   Procedure: RIGHT/LEFT HEART CATH AND CORONARY ANGIOGRAPHY;  Surgeon: Yates Decamp, MD;  Location: [facility];  Service: Cardiovascular;  Laterality: N/A;   THYROIDECTOMY, PARTIAL  1975   TONSILLECTOMY     as a child - patient not sure of exact date   TOTAL KNEE ARTHROPLASTY Left 03/13/2016   Procedure: TOTAL KNEE ARTHROPLASTY;  Surgeon: Durene Romans, MD;  Location: [facility];  Service:  Orthopedics;  Laterality: Left;   TOTAL KNEE ARTHROPLASTY Right 06/18/2017   Procedure: RIGHT TOTAL KNEE ARTHROPLASTY;  Surgeon: Durene Romans, MD;  Location: [facility];  Service: Orthopedics;  Laterality: Right;   TRANSCATHETER AORTIC VALVE REPLACEMENT, TRANSFEMORAL N/A 09/03/2018   Procedure: TRANSCATHETER AORTIC VALVE REPLACEMENT, TRANSFEMORAL;  Surgeon: Kathleene Hazel, MD;  Location: [facility] OR;  Service: Open Heart Surgery;  Laterality: N/A;   Social History   Tobacco Use   Smoking status: Never   Smokeless tobacco: Never  Substance Use Topics   Alcohol use: No    Family History  Problem Relation Age of Onset   Diabetes Mother    Stroke Mother        Carotid artery disease   Heart disease Father        CAD   Coronary artery disease Father    Diabetes Sister     Marital Status: Widowed  ROS  Review of Systems  Cardiovascular:  Positive for chest pain and dyspnea on exertion. Negative for leg swelling.   Objective      04/16/2023    6:00 AM 04/16/2023    5:45 AM 04/16/2023    5:15 AM  Vitals with BMI  Systolic 109 107 272  Diastolic 49 51 50  Pulse 65 66 70    Blood pressure (!) 109/49, pulse 65, temperature 98.4 F (36.9 C), temperature source Temporal, resp. rate 15, height 5\' 3"  (1.6 m), weight 67.6 kg, SpO2 95 %.   Physical Exam Neck:     Vascular: No carotid bruit or JVD.  Cardiovascular:     Rate and Rhythm: Normal rate and regular rhythm.     Pulses: Intact distal pulses.     Heart sounds: Normal heart sounds. No murmur heard.    No gallop.  Pulmonary:     Effort: Pulmonary effort is normal.     Breath sounds: Examination of the right-lower field reveals rales. Examination of the left-lower field reveals rales. Rales present.  Abdominal:     General: Bowel sounds are normal.     Palpations: Abdomen is soft.  Musculoskeletal:     Right lower leg: No edema.     Left lower leg: No edema.    Laboratory examination:   Recent Labs    04/13/23 0944  04/15/23 1805 04/16/23 0400  NA 129* 133* 134*  K 3.7 3.5 3.5  CL 94* 93* 95*  CO2 25 25 26   GLUCOSE 121* 109* 112*  BUN 17 33* 29*  CREATININE 0.83 1.00 0.91  CALCIUM 8.2* 7.9* 7.8*  GFRNONAA >60 55* >60      Latest Ref Rng & Units 04/16/2023    4:00 AM 04/15/2023    9:45 PM 04/15/2023    6:05 PM  CMP  Glucose 70 - 99 mg/dL 536   644   BUN 8 - 23 mg/dL 29   33   Creatinine 0.34 - 1.00 mg/dL 7.42   5.95  Sodium 135 - 145 mmol/L 134   133   Potassium 3.5 - 5.1 mmol/L 3.5   3.5   Chloride 98 - 111 mmol/L 95   93   CO2 22 - 32 mmol/L 26   25   Calcium 8.9 - 10.3 mg/dL 7.8   7.9   Total Protein 6.5 - 8.1 g/dL 4.6  4.9    Total Bilirubin 0.3 - 1.2 mg/dL 1.1  0.8    Alkaline Phos 38 - 126 U/L 64  69    AST 15 - 41 U/L 15  17    ALT 0 - 44 U/L 18  19        Latest Ref Rng & Units 04/16/2023    4:00 AM 04/15/2023    9:45 PM 04/15/2023    6:05 PM  CBC  WBC 4.0 - 10.5 K/uL 7.4  10.5  9.8   Hemoglobin 12.0 - 15.0 g/dL 14.7  82.9  56.2   Hematocrit 36.0 - 46.0 % 37.6  41.2  41.2   Platelets 150 - 400 K/uL 121  169  184    Lipid Panel Recent Labs    03/30/23 1415  CHOL 174  TRIG 76  LDLCALC 100*  VLDL 15  HDL 59  CHOLHDL 2.9    HEMOGLOBIN A1C Lab Results  Component Value Date   HGBA1C 5.2 03/30/2023   MPG 102.54 03/30/2023   TSH Recent Labs    02/17/23 0922 04/15/23 2145  TSH 1.499 0.612   BNP (last 3 results) Recent Labs    06/19/22 1012 02/17/23 0922 04/13/23 0950  BNP 176.8* 454.3* 266.5*   Cardiac Panel (last 3 results) Recent Labs    04/15/23 2145 04/16/23 0010 04/16/23 0220  CKTOTAL 15*  --   --   TROPONINIHS 255* 120* 195*  Troponin 02/16/2022 serial: 23, 47, 97.  Medications and allergies   Allergies  Allergen Reactions   Penicillins Other (See Comments)    UNSPECIFIED REACTION  Patient does not remember reaction.  Has patient had a PCN reaction causing immediate rash, facial/tongue/throat swelling, SOB or lightheadedness with  hypotension: no Has patient had a PCN reaction causing severe rash involving mucus membranes or skin necrosis: no Has patient had a PCN reaction that required hospitalization no Has patient had a PCN reaction occurring within the last 10 years: no If all of the above answers are "NO", then may proceed with Cephalosporin use.    Sulfa Antibiotics Other (See Comments)    UNSPECIFIED REACTION  "maybe vision issues? "     Current Meds  Medication Sig   acyclovir (ZOVIRAX) 400 MG tablet Take 1 tablet (400 mg total) by mouth 2 (two) times daily.   amiodarone (PACERONE) 200 MG tablet Take 0.5 tablets (100 mg total) by mouth daily.   apixaban (ELIQUIS) 5 MG TABS tablet Take 1 tablet (5 mg total) by mouth 2 (two) times daily.   Artificial Tear Solution (SOOTHE XP) SOLN Place 1 drop into both eyes every evening.   benzonatate (TESSALON) 100 MG capsule TAKE 1 CAPSULE BY MOUTH THREE TIMES A DAY AS NEEDED FOR COUGH (Patient taking differently: Take 100 mg by mouth 3 (three) times daily as needed for cough.)   Cholecalciferol (VITAMIN D3) 50 MCG (2000 UT) capsule Take 1 capsule (2,000 Units total) by mouth daily.   [employer] VITAMIN B12 1000 MCG tablet TAKE 1 TABLET BY MOUTH EVERY DAY   dapagliflozin propanediol (FARXIGA) 10 MG TABS tablet TAKE 1 TABLET BY MOUTH EVERY DAY  dexamethasone (DECADRON) 4 MG tablet Take 5 tablets (20 mg total) by mouth once a week. Take 20 mg (5 tablets) on day of multiple myeloma treatment   esomeprazole (NEXIUM) 20 MG capsule Take 20 mg by mouth daily as needed (Heartburn).   furosemide (LASIX) 40 MG tablet Take 40 mg by mouth daily as needed for fluid.   lenalidomide (REVLIMID) 25 MG capsule Take 1 capsule (25 mg total) by mouth daily. Take for 14 days,then none for 7 days.Repeat every 21 days. Auth # [id number]  Date obtained 04/13/23   levothyroxine (SYNTHROID, LEVOTHROID) 100 MCG tablet Take 100 mcg by mouth daily before breakfast.   metoprolol succinate (TOPROL-XL) 25 MG 24 hr  tablet Take 1 tablet (25 mg total) by mouth daily.   nystatin (MYCOSTATIN/NYSTOP) powder Apply 1 Application topically 3 (three) times daily.   OVER THE COUNTER MEDICATION Take 1 capsule by mouth in the morning and at bedtime. AREDS   polyethylene glycol (MIRALAX / GLYCOLAX) 17 g packet Take 17 g by mouth daily as needed for mild constipation.   potassium chloride (KLOR-CON M10) 10 MEQ tablet TAKE 2 TABLETS BY MOUTH 2 TIMES DAILY.   pravastatin (PRAVACHOL) 40 MG tablet Take 40 mg by mouth every evening.   sennosides-docusate sodium (SENOKOT-S) 8.6-50 MG tablet Take 1 tablet by mouth daily as needed for constipation.    Scheduled Meds:  amiodarone  100 mg Oral Daily   apixaban  5 mg Oral BID   levothyroxine  100 mcg Oral QAC breakfast   pantoprazole  40 mg Oral Daily   pravastatin  40 mg Oral QPM   senna-docusate  1 tablet Oral Daily   Continuous Infusions:  sodium chloride 100 mL/hr at 04/16/23 0346   PRN Meds:.acetaminophen **OR** acetaminophen, HYDROcodone-acetaminophen, ondansetron **OR** ondansetron (ZOFRAN) IV   No intake/output data recorded. Total I/O In: 1000 [IV Piggyback:1000] Out: -   Net IO Since Admission: 1,000 mL [04/16/23 0659]   Radiology:   Chest x-ray 1 view 04/15/2023: Stable cardiomediastinal silhouette. TAVR. Aortic atherosclerotic calcification. Bibasilar atelectasis. No pleural effusion or pneumothorax.  Cardiac Studies:   Echocardiogram 04/16/2023  1. Left ventricular ejection fraction, by estimation, is 60 to 65%. The left ventricle has normal function. The left ventricle has no regional wall motion abnormalities. There is moderate concentric left ventricular hypertrophy. Left ventricular  diastolic parameters are consistent with Grade II diastolic dysfunction (pseudonormalization). Elevated left ventricular end-diastolic pressure.  2. Right ventricular systolic function is normal. The right ventricular size is normal. There is mildly elevated  pulmonary artery systolic pressure. The estimated right ventricular systolic pressure is 41.4 mmHg.  3. Left atrial size was moderately dilated.  4. The mitral valve is normal in structure. Mild mitral valve regurgitation.  5. The aortic valve has been repaired/replaced. Aortic valve regurgitation is mild. No aortic stenosis is present. There is a 23 mm 3 THV size 23 mm Sapien prosthetic (TAVR) valve present in the aortic position. Procedure Date: 09/03/2018.  6. The inferior vena cava is normal in size with <50% respiratory variability, suggesting right atrial pressure of 8 mmHg.   Comparison(s): A prior study was performed on 02/17/2023. No significant change from prior study.  EKG:  EKG 04/16/2023: Normal sinus rhythm at the rate of 75 bpm, left atrial enlargement, otherwise normal EKG.  EKG 04/15/2023 at 1825: Normal sinus rhythm at the rate of 86 bpm, left atrial enlargement, otherwise normal EKG. EMS EKG 04/15/2023 at 1728 hrs.: Atrial fibrillation with rapid ventricular response, ventricular rate around  135 250 bpm.  Nonspecific ST-T changes.   Assessment   1.  Paroxysmal atrial fibrillation CHA2DS2-VASc Score is 5.  Yearly risk of stroke: 10% (A, F, HTN, CHF).  Score of 1=0.6; 2=2.2; 3=3.2; 4=4.8; 5=7.2; 6=9.8; 7=>9.8) -(CHF; HTN; vasc disease DM,  Female = 1; Age <65 =0; 65-74 = 1,  >75 =2; stroke/embolism= 2).   2.  Acute on chronic diastolic heart failure 3.  Chest pain appears musculoskeletal and pleuritic 4.  Type II MI with elevated serum troponin  Recommendations:   Her chest pain symptoms are clearly reproducible, she has significant tenderness in her sternal bone, suspect either multiple myeloma and chemotherapy may be causing some of her chest pain.  She has EKG that is normal, in 2019 she had normal coronary arteries.  No ischemic changes after she has converted to sinus rhythm essentially revealing early repolarization changes.  Echocardiogram reveals preserved LVEF with  grade 2 diastolic dysfunction and prosthetic aortic valve is functioning normally.  In view of markedly elevated serum troponin, to exclude coronary disease as she is presenting for the second time with chest pain and elevated troponin although EKG is abnormal and chest pain is easily reproducible, will schedule for right and left heart catheterization.  Will hold off on Eliquis tomorrow morning and restart when appropriate, my suspicion for coronary disease is low.  Will increase amiodarone to 200 mg daily from 100 mg daily in view of recurrence of atrial fibrillation.  Will start her back on metoprolol succinate 25 mg daily along with torsemide 20 mg daily.  I will start Farxiga 10 mg daily for heart failure as well.  Echocardiogram reviewed, no change from echocardiogram 2 months ago.  No pericardial effusion.  Schedule for cardiac catheterization, and possible angioplasty. We discussed regarding risks, benefits, alternatives to this including stress testing, CTA and continued medical therapy. Patient wants to proceed. Understands <1-2% risk of death, stroke, MI, urgent CABG, bleeding, infection, renal failure but not limited to these.     Yates Decamp, MD, [facility] 04/16/2023, 6:59 AM Office: [phone]

## 2023-04-16 NOTE — Hospital Course (Addendum)
Zoe [name] was admitted to the hospital with the working diagnosis of atrial fibrillation with rapid ventricular response.   89 yo female with the past medical history of atrial fibrillation, aortic stenosis sp TAVR, hypothyroid, hypertension, multiple myeloma who presented with palpitations. Reported dyspnea for 2 days. She had acute onset of palpitations, her blood pressure was 90/50 and HR 155, EMS was called and it was attempted direct current cardioversion 200 J, with converting to sinus rhythm. Her lungs were clear to auscultation, with no wheezing or rales, heart with S1 and S2 present and rhythmic, with no gallops, positive murmur at the right lower sternal border, abdomen with no distention and no lower extremity edema.   Na 133, K 3.5 Cl 93, bicarbonate 25 glucose 109, bun 33 cr 1,0 High sensitive troponin 263, 255, 120  Wbc 9,8 hgb 13.4 plt 184   Urine analysis SG 1,023, protein negative, > 500 glucose, negative leukocytes and negative Hgb.  Urine osmolality 694   Chest radiograph with cardiomegaly, no infiltrates or effusions, right base atelectasis.   EKG 86 bpm, normal axis, normal intervals, sinus rhythm with right atrial enlargement, no significant ST segment or T wave changes. Positive LVH.   Hip radiographs with no acute fractures.   06/25 last night patient converted back to atrial fibrillation with rapid ventricular response. She has been placed on IV diltiazem and IV amiodarone.  She had confusion this am, required bilateral mittens.  06/26 mentation has improved, unsuccessful direct current cardioversion. 06/27 continue atrial fibrillation rhythm with improvement in rate control.  06/28 patient had direct current cardioversion, with brief sinus rhythm, but then back in atrial fibrillation with RVR.  06/29 continue in atrial fibrillation, some improvement in hear rate.  06/30 improved heart rate 100's. Pending transfer to SNF.  07/01 patient converted to sinus rhythm,  with bradycardia.  07/02 patient continue sinus rhythm with rate in the 60's.

## 2023-04-16 NOTE — Assessment & Plan Note (Signed)
Continue levothyroxine 

## 2023-04-16 NOTE — Telephone Encounter (Signed)
Patients daugher in law called to let you know pt's home life in very stressful, but it has been taken care of, do you think this could be anxiety? [name] Eklund [phone]

## 2023-04-16 NOTE — Assessment & Plan Note (Signed)
Pt is on eliquis will cont

## 2023-04-16 NOTE — Assessment & Plan Note (Signed)
Continue Pravachol 40 mg po q day

## 2023-04-16 NOTE — Assessment & Plan Note (Signed)
Currently appears dry Hold lasix and rehydrate

## 2023-04-16 NOTE — Progress Notes (Addendum)
   04/16/23 2137  Provider Notification  Provider Name/Title Dr. Pierre Bali  Date Provider Notified 04/16/23  Time Provider Notified 2146  Method of Notification Page  Notification Reason Change in status, Afib with RVR, HR 140-150, BP95/55 with severe left/mid chest pain scale 10/10  Provider response See new orders;Evaluate remotely. Cardizem 10 mg bolus from bag and initially start at 5 mg/hr, Morphine 2 mg IV, NSS 500 ml bolus and stat EKG 12 leads.  Date of Provider Response 04/16/23  Time of Provider Response 2148   Myles Gip, RN

## 2023-04-16 NOTE — Assessment & Plan Note (Addendum)
AKI, Hypokalemia. Hypomagnesemia   Renal function has improved with a serum cr of 0,96 with K at 3,2 and serum bicarbonate at 26. Na 133 and P 3,2   Plan to continue K correction with Kcl today and follow up renal function and electrolytes as outpatient in 7 days.

## 2023-04-16 NOTE — Assessment & Plan Note (Signed)
Bp soft will allow permissive HTN for tonight

## 2023-04-16 NOTE — Assessment & Plan Note (Addendum)
Resumed anticoagulation with apixaban.   Mild cognitive impairment. Intermittent confusion, that has resolved.   Continue to promote adequate sleep and nutrition.  Mentation is back to baseline.

## 2023-04-16 NOTE — Progress Notes (Signed)
  Echocardiogram 2D Echocardiogram has been performed.  Zoe [name] 04/16/2023, 2:28 PM

## 2023-04-16 NOTE — Assessment & Plan Note (Signed)
Continue with statin therapy.  ?

## 2023-04-17 ENCOUNTER — Encounter (HOSPITAL_COMMUNITY)
Admission: EM | Disposition: A | Discharge status: Skilled Nursing Facility | Payer: Self-pay | Source: Home / Self Care | Attending: Internal Medicine

## 2023-04-17 DIAGNOSIS — E871 Hypo-osmolality and hyponatremia: Secondary | ICD-10-CM | POA: Diagnosis not present

## 2023-04-17 DIAGNOSIS — I480 Paroxysmal atrial fibrillation: Secondary | ICD-10-CM | POA: Diagnosis not present

## 2023-04-17 DIAGNOSIS — I5032 Chronic diastolic (congestive) heart failure: Secondary | ICD-10-CM | POA: Diagnosis not present

## 2023-04-17 DIAGNOSIS — I4891 Unspecified atrial fibrillation: Secondary | ICD-10-CM

## 2023-04-17 LAB — SURGICAL PCR SCREEN
MRSA, PCR: NEGATIVE
Staphylococcus aureus: NEGATIVE

## 2023-04-17 LAB — APTT
aPTT: 51 seconds — ABNORMAL HIGH (ref 24–36)
aPTT: 91 seconds — ABNORMAL HIGH (ref 24–36)

## 2023-04-17 LAB — HEPARIN LEVEL (UNFRACTIONATED)
Heparin Unfractionated: >1.1 IU/mL — ABNORMAL HIGH (ref 0.30–0.70)
Heparin Unfractionated: >1.1 IU/mL — ABNORMAL HIGH (ref 0.30–0.70)

## 2023-04-17 SURGERY — RIGHT/LEFT HEART CATH AND CORONARY ANGIOGRAPHY
Anesthesia: LOCAL

## 2023-04-17 MED ORDER — METOPROLOL TARTRATE 50 MG PO TABS
50.0000 mg | ORAL_TABLET | Freq: Two times a day (BID) | ORAL | Status: DC
  Administered 2023-04-17 – 2023-04-24 (×14): 50 mg via ORAL
  Filled 2023-04-17 (×14): qty 1

## 2023-04-17 MED ORDER — MAGNESIUM SULFATE 2 GM/50ML IV SOLN
2.0000 g | Freq: Once | INTRAVENOUS | Status: AC
  Administered 2023-04-17: 2 g via INTRAVENOUS
  Filled 2023-04-17: qty 50

## 2023-04-17 MED ORDER — DILTIAZEM HCL-DEXTROSE 125-5 MG/125ML-% IV SOLN (PREMIX)
5.0000 mg/h | INTRAVENOUS | Status: DC
  Administered 2023-04-17: 15 mg/h via INTRAVENOUS
  Administered 2023-04-17 – 2023-04-18 (×2): 5 mg/h via INTRAVENOUS
  Administered 2023-04-18: 10 mg/h via INTRAVENOUS
  Administered 2023-04-18 – 2023-04-20 (×4): 15 mg/h via INTRAVENOUS
  Filled 2023-04-17 (×6): qty 125

## 2023-04-17 MED ORDER — AMIODARONE HCL IN DEXTROSE 360-4.14 MG/200ML-% IV SOLN
30.0000 mg/h | INTRAVENOUS | Status: DC
  Administered 2023-04-17 – 2023-04-19 (×6): 30 mg/h via INTRAVENOUS
  Filled 2023-04-17 (×5): qty 200

## 2023-04-17 MED ORDER — AMIODARONE HCL IN DEXTROSE 360-4.14 MG/200ML-% IV SOLN
60.0000 mg/h | INTRAVENOUS | Status: DC
  Administered 2023-04-17: 60 mg/h via INTRAVENOUS
  Filled 2023-04-17: qty 200

## 2023-04-17 MED ORDER — AMIODARONE HCL IN DEXTROSE 360-4.14 MG/200ML-% IV SOLN
INTRAVENOUS | Status: AC
  Filled 2023-04-17: qty 200

## 2023-04-17 MED ORDER — ENSURE ENLIVE PO LIQD
237.0000 mL | Freq: Two times a day (BID) | ORAL | Status: DC
  Administered 2023-04-17 – 2023-04-19 (×2): 237 mL via ORAL

## 2023-04-17 MED ORDER — ASPIRIN 81 MG PO CHEW
81.0000 mg | CHEWABLE_TABLET | ORAL | Status: AC
  Administered 2023-04-17: 81 mg via ORAL
  Filled 2023-04-17: qty 1

## 2023-04-17 MED ORDER — SODIUM CHLORIDE 0.9 % IV SOLN: 250.0000 mL | INTRAVENOUS | Status: DC | PRN

## 2023-04-17 MED ORDER — SODIUM CHLORIDE 0.9% FLUSH: 3.0000 mL | INTRAVENOUS | Status: DC | PRN

## 2023-04-17 MED ORDER — LORATADINE 10 MG PO TABS
10.0000 mg | ORAL_TABLET | Freq: Every day | ORAL | Status: DC
  Administered 2023-04-17 – 2023-04-24 (×8): 10 mg via ORAL
  Filled 2023-04-17 (×8): qty 1

## 2023-04-17 MED ORDER — METOPROLOL TARTRATE 5 MG/5ML IV SOLN
2.5000 mg | Freq: Once | INTRAVENOUS | Status: AC
  Administered 2023-04-17: 2.5 mg via INTRAVENOUS
  Filled 2023-04-17: qty 5

## 2023-04-17 MED ORDER — AMIODARONE LOAD VIA INFUSION
150.0000 mg | Freq: Once | INTRAVENOUS | Status: AC
  Administered 2023-04-17: 150 mg via INTRAVENOUS
  Filled 2023-04-17: qty 83.34

## 2023-04-17 MED ORDER — SODIUM CHLORIDE 0.9 % IV SOLN: INTRAVENOUS | Status: DC

## 2023-04-17 MED ORDER — HEPARIN (PORCINE) 25000 UT/250ML-% IV SOLN
1100.0000 [IU]/h | INTRAVENOUS | Status: DC
  Administered 2023-04-17 – 2023-04-18 (×2): 1100 [IU]/h via INTRAVENOUS
  Filled 2023-04-17 (×2): qty 250

## 2023-04-17 MED ORDER — POTASSIUM CHLORIDE 10 MEQ/100ML IV SOLN
10.0000 meq | INTRAVENOUS | Status: AC
  Administered 2023-04-17 (×3): 10 meq via INTRAVENOUS
  Filled 2023-04-17 (×3): qty 100

## 2023-04-17 NOTE — Progress Notes (Signed)
ANTICOAGULATION CONSULT NOTE - Initial Consult  Pharmacy Consult for heparin Indication: atrial fibrillation  Allergies  Allergen Reactions   Penicillins Other (See Comments)    UNSPECIFIED REACTION  Patient does not remember reaction.  Has patient had a PCN reaction causing immediate rash, facial/tongue/throat swelling, SOB or lightheadedness with hypotension: no Has patient had a PCN reaction causing severe rash involving mucus membranes or skin necrosis: no Has patient had a PCN reaction that required hospitalization no Has patient had a PCN reaction occurring within the last 10 years: no If all of the above answers are "NO", then may proceed with Cephalosporin use.    Sulfa Antibiotics Other (See Comments)    UNSPECIFIED REACTION  "maybe vision issues? "    Patient Measurements: Height: 5\' 3"  (160 cm) Weight: 68.9 kg (151 lb 12.8 oz) IBW/kg (Calculated) : 52.4 Heparin Dosing Weight: 65kg  Vital Signs: Temp: 98.1 F (36.7 C) (06/25 0000) Temp Source: Oral (06/25 0000) BP: 121/70 (06/25 0239) Pulse Rate: 134 (06/25 0239)  Labs: Recent Labs    04/15/23 1805 04/15/23 2145 04/16/23 0010 04/16/23 0220 04/16/23 0400  HGB 13.4 13.2  --   --  12.2  HCT 41.2 41.2  --   --  37.6  PLT 184 169  --   --  121*  CREATININE 1.00  --   --   --  0.91  CKTOTAL  --  15*  --   --   --   TROPONINIHS 263* 255* 120* 195*  --     Estimated Creatinine Clearance: 40.6 mL/min (by C-G formula based on SCr of 0.91 mg/dL).   Medical History: Past Medical History:  Diagnosis Date   Arthritis    some - per patient   Breast cancer (HCC)    breast cancer / left    Cataract    bilat    GERD (gastroesophageal reflux disease)    History of kidney stones    Hyperlipidemia    Hypertension    Hypothyroidism    Macular degeneration    Left   S/P TAVR (transcatheter aortic valve replacement) 09/03/2018   23 mm Edwards Sapien 3 transcatheter heart valve placed via percutaneous right  transfemoral approach    Severe aortic stenosis    Stress incontinence    Thyroid disease    Tinnitus     Medications:  Medications Prior to Admission  Medication Sig Dispense Refill Last Dose   acyclovir (ZOVIRAX) 400 MG tablet Take 1 tablet (400 mg total) by mouth 2 (two) times daily. 60 tablet 3 04/15/2023 at am   amiodarone (PACERONE) 200 MG tablet Take 0.5 tablets (100 mg total) by mouth daily. 45 tablet 1 04/15/2023 at am   apixaban (ELIQUIS) 5 MG TABS tablet Take 1 tablet (5 mg total) by mouth 2 (two) times daily. 180 tablet 1 04/15/2023 at 1130   Artificial Tear Solution (SOOTHE XP) SOLN Place 1 drop into both eyes every evening.   04/14/2023 at pm   benzonatate (TESSALON) 100 MG capsule TAKE 1 CAPSULE BY MOUTH THREE TIMES A DAY AS NEEDED FOR COUGH (Patient taking differently: Take 100 mg by mouth 3 (three) times daily as needed for cough.) 20 capsule 0 Past Week   Cholecalciferol (VITAMIN D3) 50 MCG (2000 UT) capsule Take 1 capsule (2,000 Units total) by mouth daily.   04/15/2023   [employer] VITAMIN B12 1000 MCG tablet TAKE 1 TABLET BY MOUTH EVERY DAY 90 tablet 1 04/15/2023   dapagliflozin propanediol (FARXIGA) 10 MG TABS  tablet TAKE 1 TABLET BY MOUTH EVERY DAY 90 tablet 2 04/15/2023 at am   dexamethasone (DECADRON) 4 MG tablet Take 5 tablets (20 mg total) by mouth once a week. Take 20 mg (5 tablets) on day of multiple myeloma treatment 20 tablet 5 Past Week   esomeprazole (NEXIUM) 20 MG capsule Take 20 mg by mouth daily as needed (Heartburn).   Past Week   furosemide (LASIX) 40 MG tablet Take 40 mg by mouth daily as needed for fluid.   04/15/2023 at am   lenalidomide (REVLIMID) 25 MG capsule Take 1 capsule (25 mg total) by mouth daily. Take for 14 days,then none for 7 days.Repeat every 21 days. Auth # [id number]  Date obtained 04/13/23 14 capsule 0 04/15/2023 at am   levothyroxine (SYNTHROID, LEVOTHROID) 100 MCG tablet Take 100 mcg by mouth daily before breakfast.  2 04/15/2023 at am   metoprolol  succinate (TOPROL-XL) 25 MG 24 hr tablet Take 1 tablet (25 mg total) by mouth daily. 30 tablet 2 04/15/2023 at am   nystatin (MYCOSTATIN/NYSTOP) powder Apply 1 Application topically 3 (three) times daily. 15 g 0 Past Week   OVER THE COUNTER MEDICATION Take 1 capsule by mouth in the morning and at bedtime. AREDS   04/16/2023 at am   polyethylene glycol (MIRALAX / GLYCOLAX) 17 g packet Take 17 g by mouth daily as needed for mild constipation.   Past Week   potassium chloride (KLOR-CON M10) 10 MEQ tablet TAKE 2 TABLETS BY MOUTH 2 TIMES DAILY. 360 tablet 3 pm   pravastatin (PRAVACHOL) 40 MG tablet Take 40 mg by mouth every evening.   04/14/2023 at pm   sennosides-docusate sodium (SENOKOT-S) 8.6-50 MG tablet Take 1 tablet by mouth daily as needed for constipation.   04/15/2023 at am   guaiFENesin (MUCINEX) 600 MG 12 hr tablet Take 600 mg by mouth 2 (two) times daily. (Patient not taking: Reported on 04/16/2023)   Not Taking   Scheduled:   amiodarone  200 mg Oral Daily   [START ON 04/18/2023] aspirin  81 mg Oral Pre-Cath   dapagliflozin propanediol  10 mg Oral Daily   levothyroxine  100 mcg Oral QAC breakfast   metoprolol succinate  25 mg Oral Daily   pantoprazole  40 mg Oral Daily   pravastatin  40 mg Oral QPM   senna-docusate  1 tablet Oral Daily   sodium chloride flush  3 mL Intravenous Q12H   sucralfate  1 g Oral TID WC & HS   torsemide  20 mg Oral Daily   Infusions:   sodium chloride     [START ON 04/18/2023] sodium chloride     diltiazem (CARDIZEM) infusion 5 mg/hr (04/17/23 0231)   magnesium sulfate bolus IVPB 2 g (04/17/23 0144)   potassium chloride      Assessment: 89yo female admitted for Afib w/ RVR and had converted to NSR > diltiazem infusion had been d/c'd and pt went back into Afib w/ RVR, now awaiting cardiac cath, to transition from apixaban to UFH; last dose of apixaban was given at 2130 on 6/24.  Goal of Therapy:  Heparin level 0.3-0.7 units/ml aPTT 66-102 seconds Monitor  platelets by anticoagulation protocol: Yes   Plan:  12h after apixaban dose start heparin infusion at 1100 units/hr (based on recent requirements). Monitor heparin levels, aPTT (while DOAC affects anti-Xa assay), and CBC.  Vernard Gambles, PharmD, BCPS  04/17/2023,2:41 AM

## 2023-04-17 NOTE — Progress Notes (Signed)
Spoke to [employer]  (Daughter), I am not sure about her reasons for confusion, I had set her up for left and right heart catheterization but I do not think it is necessary for now.  Given normal EKG, no wall motion abnormality on the echocardiogram, I am beginning to wonder if for symptoms of chest pain related to multiple myeloma and sternal tenderness, confusional state, heart failure, failure to thrive could all be related to chemotherapeutic agents as she has had multiple complaints since start of chemotherapy.  Since my suspicion is chemotherapy induced side effects to her presentation, I would like to have a discussion with oncology tomorrow.  The most I would like to do would be repeat cardioversion as she is now on amiodarone rebolus.  I will discontinue metoprolol succinate 25 mg daily and as her blood pressure is stable, we will switch her to metoprolol tartrate 50 mg p.o. twice daily.  Further recommendations to follow.

## 2023-04-17 NOTE — H&P (View-Only) (Signed)
Subjective:  Very upset.  States that either she is "losing her mind or people around are". She is not ready to undergo any procedure at this time.  She denies any chest pain, shortness of breath symptoms.   Current Facility-Administered Medications:    0.9 %  sodium chloride infusion, 250 mL, Intravenous, PRN, Yates Decamp, MD, Last Rate: 10 mL/hr at 04/17/23 0100, Restarted at 04/17/23 0100   [START ON 04/18/2023] 0.9 %  sodium chloride infusion, , Intravenous, Continuous, Yates Decamp, MD   acetaminophen (TYLENOL) tablet 650 mg, 650 mg, Oral, Q6H PRN **OR** acetaminophen (TYLENOL) suppository 650 mg, 650 mg, Rectal, Q6H PRN, Adela Glimpse, Anastassia, MD   [COMPLETED] amiodarone (NEXTERONE) 1.8 mg/mL load via infusion 150 mg, 150 mg, Intravenous, Once, 150 mg at 04/17/23 0552 **FOLLOWED BY** [EXPIRED] amiodarone (NEXTERONE PREMIX) 360-4.14 MG/200ML-% (1.8 mg/mL) IV infusion, 60 mg/hr, Intravenous, Continuous, Last Rate: 33.3 mL/hr at 04/17/23 0952, 60 mg/hr at 04/17/23 0952 **FOLLOWED BY** amiodarone (NEXTERONE PREMIX) 360-4.14 MG/200ML-% (1.8 mg/mL) IV infusion, 30 mg/hr, Intravenous, Continuous, Yates Decamp, MD, Last Rate: 16.67 mL/hr at 04/17/23 1232, 30 mg/hr at 04/17/23 1232   dapagliflozin propanediol (FARXIGA) tablet 10 mg, 10 mg, Oral, Daily, Arrien, York Ram, MD, 10 mg at 04/17/23 1032   diltiazem (CARDIZEM) 125 mg in dextrose 5% 125 mL (1 mg/mL) infusion, 5-15 mg/hr, Intravenous, Titrated, Tolia, Sunit, DO, Stopped at 04/17/23 1033   heparin ADULT infusion 100 units/mL (25000 units/235mL), 1,100 Units/hr, Intravenous, Continuous, Bryk, Veronda P, RPH, Last Rate: 11 mL/hr at 04/17/23 1010, 1,100 Units/hr at 04/17/23 1010   HYDROcodone-acetaminophen (NORCO/VICODIN) 5-325 MG per tablet 1-2 tablet, 1-2 tablet, Oral, Q4H PRN, Therisa Doyne, MD, 2 tablet at 04/17/23 7846   levothyroxine (SYNTHROID) tablet 100 mcg, 100 mcg, Oral, QAC breakfast, Doutova, Anastassia, MD, 100 mcg at 04/17/23 0519    loratadine (CLARITIN) tablet 10 mg, 10 mg, Oral, Daily, Arrien, York Ram, MD, 10 mg at 04/17/23 1228   metoprolol succinate (TOPROL-XL) 24 hr tablet 25 mg, 25 mg, Oral, Daily, Arrien, York Ram, MD, 25 mg at 04/16/23 1310   ondansetron (ZOFRAN) tablet 4 mg, 4 mg, Oral, Q6H PRN **OR** ondansetron (ZOFRAN) injection 4 mg, 4 mg, Intravenous, Q6H PRN, Doutova, Anastassia, MD   pantoprazole (PROTONIX) EC tablet 40 mg, 40 mg, Oral, Daily, Doutova, Anastassia, MD, 40 mg at 04/17/23 0954   pravastatin (PRAVACHOL) tablet 40 mg, 40 mg, Oral, QPM, Doutova, Anastassia, MD, 40 mg at 04/16/23 1658   senna-docusate (Senokot-S) tablet 1 tablet, 1 tablet, Oral, Daily, Doutova, Anastassia, MD, 1 tablet at 04/17/23 1011   sodium chloride flush (NS) 0.9 % injection 3 mL, 3 mL, Intravenous, Q12H, Yates Decamp, MD, 3 mL at 04/16/23 2222   sodium chloride flush (NS) 0.9 % injection 3 mL, 3 mL, Intravenous, PRN, Yates Decamp, MD   sucralfate (CARAFATE) 1 GM/10ML suspension 1 g, 1 g, Oral, TID WC & HS, Arrien, York Ram, MD, 1 g at 04/17/23 1228   torsemide (DEMADEX) tablet 20 mg, 20 mg, Oral, Daily, Yates Decamp, MD, 20 mg at 04/16/23 1310   Objective:  Vital Signs in the last 24 hours: Temp:  [97.9 F (36.6 C)-98.9 F (37.2 C)] 98.1 F (36.7 C) (06/25 1123) Pulse Rate:  [73-153] 103 (06/25 1123) Resp:  [18-22] 20 (06/25 1123) BP: (80-140)/(55-93) 98/57 (06/25 1123) SpO2:  [77 %-98 %] 98 % (06/25 1123) Weight:  [68.9 kg] 68.9 kg (06/25 0200)  Intake/Output from previous day: 06/24 0701 - 06/25 0700 In: 1355.1 [P.O.:250; I.V.:1055.1; IV Piggyback:50]  Out: -   Physical Exam Vitals and nursing note reviewed.  Constitutional:      General: She is not in acute distress. Neck:     Vascular: No JVD.  Cardiovascular:     Rate and Rhythm: Tachycardia present. Rhythm irregular.     Heart sounds: Normal heart sounds. No murmur heard. Pulmonary:     Effort: Pulmonary effort is normal.     Breath  sounds: Normal breath sounds. No wheezing or rales.  Musculoskeletal:     Right lower leg: Edema (Trace) present.     Left lower leg: Edema (Trace) present.      Imaging/tests reviewed and independently interpreted: CXR 04/15/2023: Stable exam since 04/13/2023.  Bibasilar atelectasis.   Cardiac Studies:  Telemetry 04/17/2023: Back in Afib with RVR  EKG 04/16/2023: A-fib with RVR  Echocardiogram 04/16/2023:  1. Left ventricular ejection fraction, by estimation, is 60 to 65%. The  left ventricle has normal function. The left ventricle has no regional  wall motion abnormalities. There is moderate concentric left ventricular  hypertrophy. Left ventricular  diastolic parameters are consistent with Grade II diastolic dysfunction  (pseudonormalization). Elevated left ventricular end-diastolic pressure.   2. Right ventricular systolic function is normal. The right ventricular  size is normal. There is mildly elevated pulmonary artery systolic  pressure. The estimated right ventricular systolic pressure is 41.4 mmHg.   3. Left atrial size was moderately dilated.   4. The mitral valve is normal in structure. Mild mitral valve  regurgitation.   5. The aortic valve has been repaired/replaced. Aortic valve  regurgitation is mild. No aortic stenosis is present. There is a 23 mm 3  THV size 23 mm Sapien prosthetic (TAVR) valve present in the aortic  position. Procedure Date: 09/03/2018.   6. The inferior vena cava is normal in size with <50% respiratory  variability, suggesting right atrial pressure of 8 mmHg.   Comparison(s): A prior study was performed on 02/17/2023. No significant  change from prior study.    Assessment & Recommendations:  89 year old Caucasian female with hypertension, hyperlipidemia, s/p TAVR 2019, on chemotherapy for multiple myeloma, admitted for chest pain, troponin elevation.  Chest pain: Mild troponin elevation, although symptoms of previously noted could be  reproducible. Currently chest pain-free. Plan was to perform left and right heart catheterization today.  However, patient is very upset, and does not want to undergo any procedure.  She states that she will only for Dr. Jacinto Halim would say, although the procedure was recommended and scheduled by Dr. Jacinto Halim. I suspect either hospital delirium, or any other underlying cause for her relative confusion.  Defer workup to primary team. No plans for any procedure at this time.  A-fib with RVR: Patient underwent cardioversion in the ER on 04/15/2023, back in A-fib with RVR overnight. I do think underlying medical cause, either delirium or possibly infection, is contributing to RVR. Do not recommend repeat cardioversion at this time.  Continue IV diltiazem and amiodarone. CHA2DS2-VASc Score is 5, annual stroke risk 10%. Eliquis was reportedly held for heart catheterization.  Continue IV heparin. Resume Eliquis tomorrow, if no plans for hart catheterization.  Hyponatremia: Likely dilutional. Output not accurately documented. Na improving suggests improvement in volume status. She does not appear to be in florid heart failure at this time.  Continue baseline medications   Discussed interpretation of tests and management recommendations with the primary team   Elder Negus, MD Pager: [phone] Office: [phone]

## 2023-04-17 NOTE — Progress Notes (Signed)
Progress Note   Patient: Zoe [name]:[id number] DOB: 20-Oct-1934 DOA: 04/15/2023     1 DOS: the patient was seen and examined on 04/17/2023   Brief hospital course: Zoe [name] was admitted to the hospital with the working diagnosis of atrial fibrillation with rapid ventricular response.   89 yo female with the past medical history of atrial fibrillation, aortic stenosis sp TAVR, hypothyroid, hypertension, multiple myeloma who presented with palpitations. Reported dyspnea for 2 days. She had acute onset of palpitations, her blood pressure was 90/50 and HR 155, EMS was called and it was attempted direct current cardioversion 200 J, with converting to sinus rhythm. Her lungs were clear to auscultation, with no wheezing or rales, heart with S1 and S2 present and rhythmic, with no gallops, positive murmur at the right lower sternal border, abdomen with no distention and no lower extremity edema.   Na 133, K 3.5 Cl 93, bicarbonate 25 glucose 109, bun 33 cr 1,0 High sensitive troponin 263, 255, 120  Wbc 9,8 hgb 13.4 plt 184   Urine analysis SG 1,023, protein negative, > 500 glucose, negative leukocytes and negative Hgb.  Urine osmolality 694   Chest radiograph with cardiomegaly, no infiltrates or effusions, right base atelectasis.   EKG 86 bpm, normal axis, normal intervals, sinus rhythm with right atrial enlargement, no significant ST segment or T wave changes. Positive LVH.   Hip radiographs with no acute fractures.   06/25 last night patient converted back to atrial fibrillation with rapid ventricular response. She has been placed on IV diltiazem and IV amiodarone.  She had confusion this am, required bilateral mittens.   Assessment and Plan: * Paroxysmal atrial fibrillation [facility]) Patient has converted back to atrial fibrillation with RVR.   Currently on IV diltiazem and IV amiodarone for rate control.  Oral metoprolol 25 mg daily.  Anticoagulation with IV heparin.  Out of  bed to chair tid with meals Pt and Ot.   Elevated cardiac markers elevated, high sensitive troponin trending down. Possible elevation due to RVR.  Old records personally reviewed 2019 coronary angiography with normal coronaries.  Cardiac catheterization was cancel today, until more stable heart rate.  Chronic diastolic CHF (congestive heart failure) (HCC) Echocardiogram with preserved LV systolic function with EF 60 to 65%, moderate LVH, RV systolic function preserved, RVSP 41.4 mmHg. Sp TAVR, LA with moderate dilatation, RA normal size, no pericardial effusion, no significant valvular disease.   Continue with SGLT 2 inh and metoprolol.  Rate control with diltiazem and amiodarone IV.  Diuresis with torsemide 20 mg po daily.  Hyponatremia Renal function stable with serum cr at 0,91 with K at 3,5 and serum bicarbonate at 26. Na 134. Mg. 1.8   30 meq Kcl and 2 g mag sulfate IV.  Follow up electrolytes and renal function in am.   Hypothyroidism Continue levothyroxine.  Hypertension Continue metoprolol for blood pressure control.   Hyperlipidemia Continue with statin therapy  History of stroke Continue anticoagulation with IV heparin, possible inpatient cardiac catheterization.   Multiple myeloma not having achieved remission (HCC) Follow up as outpatient.         Subjective: Patient had confusion this am, required mittens. At the time of my examination she is more orientated and not agitated. No chest pain, but positive palpitations. No dyspnea.    Physical Exam: Vitals:   04/17/23 0505 04/17/23 0600 04/17/23 0700 04/17/23 1015  BP: (!) 108/92 (!) 122/93 121/80 (!) 80/68  Pulse: (!) 132 (!) 123 (!) 117  Resp: 20  20   Temp:      TempSrc:      SpO2: (!) 85% 94% 94%   Weight:      Height:       Neurology awake and alert, orientated and non focal. (Apparently she was disoriented and confused earlier today) ENT With no pallor or icterus Cardiovascular with S1 and S2  present, irregularly irregular with no gallop, or rubs, positive murmur at the right lower sternal border.  No JVD No lower extremity edema Respiratory with rales at bases with no wheezing or rhonchi Abdomen with no distention  Data Reviewed:    Family Communication: no family at the bedside   Disposition: Status is: Inpatient Remains inpatient appropriate because: atrial fibrillation with RVR   Planned Discharge Destination: Home     Author: Coralie Keens, MD 04/17/2023 10:37 AM  For on call review [email or web address].

## 2023-04-17 NOTE — Progress Notes (Signed)
Patient heart rate remains elevated above 120 BP 121/83,informed Dr Rosemary Holms ,advice to restart Cardizem at 5

## 2023-04-17 NOTE — Progress Notes (Addendum)
Subjective:  Very upset.  States that either she is "losing her mind or people around are". She is not ready to undergo any procedure at this time.  She denies any chest pain, shortness of breath symptoms.   Current Facility-Administered Medications:    0.9 %  sodium chloride infusion, 250 mL, Intravenous, PRN, Ganji, Jay, MD, Last Rate: 10 mL/hr at 04/17/23 0100, Restarted at 04/17/23 0100   [START ON 04/18/2023] 0.9 %  sodium chloride infusion, , Intravenous, Continuous, Ganji, Jay, MD   acetaminophen (TYLENOL) tablet 650 mg, 650 mg, Oral, Q6H PRN **OR** acetaminophen (TYLENOL) suppository 650 mg, 650 mg, Rectal, Q6H PRN, Doutova, Anastassia, MD   [COMPLETED] amiodarone (NEXTERONE) 1.8 mg/mL load via infusion 150 mg, 150 mg, Intravenous, Once, 150 mg at 04/17/23 0552 **FOLLOWED BY** [EXPIRED] amiodarone (NEXTERONE PREMIX) 360-4.14 MG/200ML-% (1.8 mg/mL) IV infusion, 60 mg/hr, Intravenous, Continuous, Last Rate: 33.3 mL/hr at 04/17/23 0952, 60 mg/hr at 04/17/23 0952 **FOLLOWED BY** amiodarone (NEXTERONE PREMIX) 360-4.14 MG/200ML-% (1.8 mg/mL) IV infusion, 30 mg/hr, Intravenous, Continuous, Ganji, Jay, MD, Last Rate: 16.67 mL/hr at 04/17/23 1232, 30 mg/hr at 04/17/23 1232   dapagliflozin propanediol (FARXIGA) tablet 10 mg, 10 mg, Oral, Daily, Arrien, Mauricio Daniel, MD, 10 mg at 04/17/23 1032   diltiazem (CARDIZEM) 125 mg in dextrose 5% 125 mL (1 mg/mL) infusion, 5-15 mg/hr, Intravenous, Titrated, Tolia, Sunit, DO, Stopped at 04/17/23 1033   heparin ADULT infusion 100 units/mL (25000 units/250mL), 1,100 Units/hr, Intravenous, Continuous, Bryk, Veronda P, RPH, Last Rate: 11 mL/hr at 04/17/23 1010, 1,100 Units/hr at 04/17/23 1010   HYDROcodone-acetaminophen (NORCO/VICODIN) 5-325 MG per tablet 1-2 tablet, 1-2 tablet, Oral, Q4H PRN, Doutova, Anastassia, MD, 2 tablet at 04/17/23 0520   levothyroxine (SYNTHROID) tablet 100 mcg, 100 mcg, Oral, QAC breakfast, Doutova, Anastassia, MD, 100 mcg at 04/17/23 0519    loratadine (CLARITIN) tablet 10 mg, 10 mg, Oral, Daily, Arrien, Mauricio Daniel, MD, 10 mg at 04/17/23 1228   metoprolol succinate (TOPROL-XL) 24 hr tablet 25 mg, 25 mg, Oral, Daily, Arrien, Mauricio Daniel, MD, 25 mg at 04/16/23 1310   ondansetron (ZOFRAN) tablet 4 mg, 4 mg, Oral, Q6H PRN **OR** ondansetron (ZOFRAN) injection 4 mg, 4 mg, Intravenous, Q6H PRN, Doutova, Anastassia, MD   pantoprazole (PROTONIX) EC tablet 40 mg, 40 mg, Oral, Daily, Doutova, Anastassia, MD, 40 mg at 04/17/23 0954   pravastatin (PRAVACHOL) tablet 40 mg, 40 mg, Oral, QPM, Doutova, Anastassia, MD, 40 mg at 04/16/23 1658   senna-docusate (Senokot-S) tablet 1 tablet, 1 tablet, Oral, Daily, Doutova, Anastassia, MD, 1 tablet at 04/17/23 1011   sodium chloride flush (NS) 0.9 % injection 3 mL, 3 mL, Intravenous, Q12H, Ganji, Jay, MD, 3 mL at 04/16/23 2222   sodium chloride flush (NS) 0.9 % injection 3 mL, 3 mL, Intravenous, PRN, Ganji, Jay, MD   sucralfate (CARAFATE) 1 GM/10ML suspension 1 g, 1 g, Oral, TID WC & HS, Arrien, Mauricio Daniel, MD, 1 g at 04/17/23 1228   torsemide (DEMADEX) tablet 20 mg, 20 mg, Oral, Daily, Ganji, Jay, MD, 20 mg at 04/16/23 1310   Objective:  Vital Signs in the last 24 hours: Temp:  [97.9 F (36.6 C)-98.9 F (37.2 C)] 98.1 F (36.7 C) (06/25 1123) Pulse Rate:  [73-153] 103 (06/25 1123) Resp:  [18-22] 20 (06/25 1123) BP: (80-140)/(55-93) 98/57 (06/25 1123) SpO2:  [77 %-98 %] 98 % (06/25 1123) Weight:  [68.9 kg] 68.9 kg (06/25 0200)  Intake/Output from previous day: 06/24 0701 - 06/25 0700 In: 1355.1 [P.O.:250; I.V.:1055.1; IV Piggyback:50]   Out: -   Physical Exam Vitals and nursing note reviewed.  Constitutional:      General: She is not in acute distress. Neck:     Vascular: No JVD.  Cardiovascular:     Rate and Rhythm: Tachycardia present. Rhythm irregular.     Heart sounds: Normal heart sounds. No murmur heard. Pulmonary:     Effort: Pulmonary effort is normal.     Breath  sounds: Normal breath sounds. No wheezing or rales.  Musculoskeletal:     Right lower leg: Edema (Trace) present.     Left lower leg: Edema (Trace) present.      Imaging/tests reviewed and independently interpreted: CXR 04/15/2023: Stable exam since 04/13/2023.  Bibasilar atelectasis.   Cardiac Studies:  Telemetry 04/17/2023: Back in Afib with RVR  EKG 04/16/2023: A-fib with RVR  Echocardiogram 04/16/2023:  1. Left ventricular ejection fraction, by estimation, is 60 to 65%. The  left ventricle has normal function. The left ventricle has no regional  wall motion abnormalities. There is moderate concentric left ventricular  hypertrophy. Left ventricular  diastolic parameters are consistent with Grade II diastolic dysfunction  (pseudonormalization). Elevated left ventricular end-diastolic pressure.   2. Right ventricular systolic function is normal. The right ventricular  size is normal. There is mildly elevated pulmonary artery systolic  pressure. The estimated right ventricular systolic pressure is 41.4 mmHg.   3. Left atrial size was moderately dilated.   4. The mitral valve is normal in structure. Mild mitral valve  regurgitation.   5. The aortic valve has been repaired/replaced. Aortic valve  regurgitation is mild. No aortic stenosis is present. There is a 23 mm 3  THV size 23 mm Sapien prosthetic (TAVR) valve present in the aortic  position. Procedure Date: 09/03/2018.   6. The inferior vena cava is normal in size with <50% respiratory  variability, suggesting right atrial pressure of 8 mmHg.   Comparison(s): A prior study was performed on 02/17/2023. No significant  change from prior study.    Assessment & Recommendations:  89-year-old Caucasian female with hypertension, hyperlipidemia, s/p TAVR 2019, on chemotherapy for multiple myeloma, admitted for chest pain, troponin elevation.  Chest pain: Mild troponin elevation, although symptoms of previously noted could be  reproducible. Currently chest pain-free. Plan was to perform left and right heart catheterization today.  However, patient is very upset, and does not want to undergo any procedure.  She states that she will only for Dr. Ganji would say, although the procedure was recommended and scheduled by Dr. Ganji. I suspect either hospital delirium, or any other underlying cause for her relative confusion.  Defer workup to primary team. No plans for any procedure at this time.  A-fib with RVR: Patient underwent cardioversion in the ER on 04/15/2023, back in A-fib with RVR overnight. I do think underlying medical cause, either delirium or possibly infection, is contributing to RVR. Do not recommend repeat cardioversion at this time.  Continue IV diltiazem and amiodarone. CHA2DS2-VASc Score is 5, annual stroke risk 10%. Eliquis was reportedly held for heart catheterization.  Continue IV heparin. Resume Eliquis tomorrow, if no plans for hart catheterization.  Hyponatremia: Likely dilutional. Output not accurately documented. Na improving suggests improvement in volume status. She does not appear to be in florid heart failure at this time.  Continue baseline medications   Discussed interpretation of tests and management recommendations with the primary team   Qasim Diveley J Carla Whilden, MD Pager: [phone] Office: [phone]  

## 2023-04-17 NOTE — Progress Notes (Signed)
Patient bp reading 80/68,Dr Sunit  notified,advice to stop Cardizem,pt remains asymptomatic

## 2023-04-17 NOTE — Progress Notes (Signed)
   04/17/23 0104  Provider Notification  Provider Name/Title Dr. Jacinto Halim, Cardiologist  Date Provider Notified 04/17/23  Time Provider Notified 0104  Method of Notification Page  Notification Reason Change in status, HR 140-150, Afib with RVR, chest pain scale 5-6/10  Provider response Other (Comment) (awaiting for response)   Filiberto Pinks, RN

## 2023-04-17 NOTE — Progress Notes (Signed)
   04/17/23 0128  Provider Notification  Provider Name/Title Dr. Imogene Burn  Date Provider Notified 04/17/23  Time Provider Notified 0128  Method of Notification Call  Notification Reason Change in status: after stopped Cardizem per MD ordered. HR 140-150, Afib RVR, BP stable, chest pain 5-6/10 scale. SPO2 88-89 on room air. O2 NCL 3 LPM is given. No SOB. Lungs clear on auscultations.  Provider response Evaluate remotely;See new orders.>> Metoprolol 25 mg IV stat, Magnesium sulfate 2 g IV and potassium 10 mEq IV x 3 bags. We will try to contact Dr. Odis Hollingshead, cardiologist.  Date of Provider Response 04/17/23  Time of Provider Response 0132   Filiberto Pinks, RN

## 2023-04-17 NOTE — Progress Notes (Addendum)
   04/17/23 0203  Provider Notification  Provider Name/Title Dr. Odis Hollingshead, cardiologist  Date Provider Notified 04/17/23  Time Provider Notified 0204  Method of Notification Page  Notification Reason Change in status. HR 140-150  Provider response MD evaluated remotely. Order received for Heparin gtt and restarting Cardizem gtt.   Date of Provider Response 04/17/23  Time of Provider Response 0221   Filiberto Pinks, RN

## 2023-04-17 NOTE — Progress Notes (Signed)
ON-CALL CARDIOLOGY 04/17/23  Patient's name: Zoe [name].   MRN: [id number].    DOB: 09-25-1934 Primary care provider: Irena Reichmann, DO. Primary cardiologist: Dr. Yates Decamp  Interaction regarding this patient's care today: RN called around 2:03am   Patient reverted back to Afib RVR around 940-10pm yesterday night.  Initially was doing well.  Than she was having CP - EKG done 2152 notes Afib RVR Has received IV Cardizem pushes, IV morphine for pain, and lopressor - these intervention helped her but symptoms resurface.  Now back in Afib w/ RVR.  CP has not worsened - but better.  She is maintaining adequate MAP   Impression: Afib RVR Precordial Pain  Recommendations: Obtain EKG - call when done.  Restart Cardizem drip.  Pharmacy consult for heparin gtt. Did get Eliquis at night but currently held due to cath later today around noon. Off note she was cardioverted in EMS 04/15/2023.  Will have to talk to cath lab when to hold IV Heparin prior to the procedure.  Tessa Lerner, [place], [facility]  Pager: [phone] Office: [phone]

## 2023-04-17 NOTE — Progress Notes (Signed)
Patient refused her repeat lab for potassium,MD notified

## 2023-04-17 NOTE — Progress Notes (Signed)
Initial Nutrition Assessment  DOCUMENTATION CODES:   Not applicable  INTERVENTION:  Liberalize diet to regular for widest variety of menu options given advanced age Ensure Enlive po BID, each supplement provides 350 kcal and 20 grams of protein.  NUTRITION DIAGNOSIS:   Increased nutrient needs related to cancer and cancer related treatments as evidenced by estimated needs.  GOAL:   Patient will meet greater than or equal to 90% of their needs  MONITOR:   PO intake, Supplement acceptance, Labs, Weight trends  REASON FOR ASSESSMENT:   Consult Assessment of nutrition requirement/status  ASSESSMENT:   Pt admitted d/t afib with RVR requiring cardioversion. PMH significant for afib, chronic diastolic CHF, AS s/p TAVR, hypothyroid, HTN, multiple myeloma not in remission.  Pt was NPO for heart cath today however postponed for now pending stable heart rate.   Spoke with pt at bedside. She endorses having a great appetite. No significant changes. She denies chewing/swallowing difficulty. Tries to limit her salt intake at home. Continues to go to [facility] for chemo injections. Has started drinking milkshakes but is considering protein supplements and is agreeable to trying ensure during admission.   Pt's dietary recall includes: Breakfast- sausage with eggs and half an english muffin for breakfast or cereal. Lunch- chicken salad with fruit Dinner- varies but may include chilli  No documented meal completions on file to review at this time as pt was NPO this morning. Will monitor and adjust supplements as appropriate.   Typically has irregular BM's. Goes 3 days and takes a stool softener if needed. Last BM x3 days ago, pt reports being given bowel medications just prior to visit.   Pt reports a stable weight with no significant changes. Reviewed weight history on file. Within the last 2 months, pt's weight has trended down noted a 8.3% weight loss between 4/22-6/25 which is  clinically significant for time frame.   Medications: farxiga, protonix, senna, carafate, torsemide Drips: amiodarone, cardizem, heparin  Labs (6/24): sodium 134, BUN 29  NUTRITION - FOCUSED PHYSICAL EXAM:  Flowsheet Row Most Recent Value  Orbital Region No depletion  Upper Arm Region No depletion  Thoracic and Lumbar Region No depletion  Buccal Region No depletion  Temple Region No depletion  Clavicle Bone Region No depletion  Clavicle and Acromion Bone Region No depletion  Scapular Bone Region No depletion  Dorsal Hand No depletion  Patellar Region No depletion  Anterior Thigh Region No depletion  Posterior Calf Region No depletion  Edema (RD Assessment) None  Hair Reviewed  Eyes Reviewed  Mouth Reviewed  Skin Reviewed  Nails Reviewed       Diet Order:   Diet Order             Diet regular Room service appropriate? Yes with Assist; Fluid consistency: Thin  Diet effective now                   EDUCATION NEEDS:   Education needs have been addressed  Skin:  Skin Assessment: Reviewed RN Assessment  Last BM:  6/22 per pt  Height:   Ht Readings from Last 1 Encounters:  04/17/23 5\' 3"  (1.6 m)    Weight:   Wt Readings from Last 1 Encounters:  04/17/23 68.9 kg   BMI:  Body mass index is 26.89 kg/m.  Estimated Nutritional Needs:   Kcal:  1600-1800  Protein:  85-100g  Fluid:  >/=1.6L  Drusilla Kanner, RDN, LDN Clinical Nutrition

## 2023-04-17 NOTE — Progress Notes (Signed)
SATURATION QUALIFICATIONS: (This note is used to comply with regulatory documentation for home oxygen)  Patient Saturations on Room Air at Rest = 96%  Patient Saturations on Room Air while Ambulating = 92%   Please briefly explain why patient needs home oxygen: 

## 2023-04-17 NOTE — Progress Notes (Signed)
OT Cancellation Note  Patient Details Name: Zoe [name] MRN: [id number] DOB: 29-Feb-1934   Cancelled Treatment:    Reason Eval/Treat Not Completed: Medical issues which prohibited therapy (HR 146bpm at rest, will follow up as appropriate)  Zoe Fila, OTD, OTR/L SecureChat Preferred Acute Rehab [phone]   Zoe [name] 04/17/2023, 3:01 PM

## 2023-04-17 NOTE — Progress Notes (Signed)
ANTICOAGULATION CONSULT NOTE - Follow Up Consult  Pharmacy Consult for heparin Indication: atrial fibrillation  Allergies  Allergen Reactions   Penicillins Other (See Comments)    UNSPECIFIED REACTION  Patient does not remember reaction.  Has patient had a PCN reaction causing immediate rash, facial/tongue/throat swelling, SOB or lightheadedness with hypotension: no Has patient had a PCN reaction causing severe rash involving mucus membranes or skin necrosis: no Has patient had a PCN reaction that required hospitalization no Has patient had a PCN reaction occurring within the last 10 years: no If all of the above answers are "NO", then may proceed with Cephalosporin use.    Sulfa Antibiotics Other (See Comments)    UNSPECIFIED REACTION  "maybe vision issues? "    Patient Measurements: Height: 5\' 3"  (160 cm) Weight: 68.9 kg (151 lb 12.8 oz) IBW/kg (Calculated) : 52.4 Heparin Dosing Weight: 65kg  Vital Signs: Temp: 99.1 F (37.3 C) (06/25 2041) Temp Source: Oral (06/25 2041) BP: 157/100 (06/25 2041) Pulse Rate: 130 (06/25 2041)  Labs: Recent Labs    04/15/23 1805 04/15/23 2145 04/16/23 0010 04/16/23 0220 04/16/23 0400 04/17/23 0349 04/17/23 1931  HGB 13.4 13.2  --   --  12.2  --   --   HCT 41.2 41.2  --   --  37.6  --   --   PLT 184 169  --   --  121*  --   --   APTT  --   --   --   --   --  51* 91*  HEPARINUNFRC  --   --   --   --   --  >1.10* >1.10*  CREATININE 1.00  --   --   --  0.91  --   --   CKTOTAL  --  15*  --   --   --   --   --   TROPONINIHS 263* 255* 120* 195*  --   --   --      Estimated Creatinine Clearance: 40.6 mL/min (by C-G formula based on SCr of 0.91 mg/dL).   Medical History: Past Medical History:  Diagnosis Date   Arthritis    some - per patient   Breast cancer (HCC)    breast cancer / left    Cataract    bilat    GERD (gastroesophageal reflux disease)    History of kidney stones    Hyperlipidemia    Hypertension     Hypothyroidism    Macular degeneration    Left   S/P TAVR (transcatheter aortic valve replacement) 09/03/2018   23 mm Edwards Sapien 3 transcatheter heart valve placed via percutaneous right transfemoral approach    Severe aortic stenosis    Stress incontinence    Thyroid disease    Tinnitus     Medications:  Medications Prior to Admission  Medication Sig Dispense Refill Last Dose   acyclovir (ZOVIRAX) 400 MG tablet Take 1 tablet (400 mg total) by mouth 2 (two) times daily. 60 tablet 3 04/15/2023 at am   amiodarone (PACERONE) 200 MG tablet Take 0.5 tablets (100 mg total) by mouth daily. 45 tablet 1 04/15/2023 at am   apixaban (ELIQUIS) 5 MG TABS tablet Take 1 tablet (5 mg total) by mouth 2 (two) times daily. 180 tablet 1 04/15/2023 at 1130   Artificial Tear Solution (SOOTHE XP) SOLN Place 1 drop into both eyes every evening.   04/14/2023 at pm   benzonatate (TESSALON) 100 MG capsule TAKE 1 CAPSULE  BY MOUTH THREE TIMES A DAY AS NEEDED FOR COUGH (Patient taking differently: Take 100 mg by mouth 3 (three) times daily as needed for cough.) 20 capsule 0 Past Week   Cholecalciferol (VITAMIN D3) 50 MCG (2000 UT) capsule Take 1 capsule (2,000 Units total) by mouth daily.   04/15/2023   [employer] VITAMIN B12 1000 MCG tablet TAKE 1 TABLET BY MOUTH EVERY DAY 90 tablet 1 04/15/2023   dapagliflozin propanediol (FARXIGA) 10 MG TABS tablet TAKE 1 TABLET BY MOUTH EVERY DAY 90 tablet 2 04/15/2023 at am   dexamethasone (DECADRON) 4 MG tablet Take 5 tablets (20 mg total) by mouth once a week. Take 20 mg (5 tablets) on day of multiple myeloma treatment 20 tablet 5 Past Week   esomeprazole (NEXIUM) 20 MG capsule Take 20 mg by mouth daily as needed (Heartburn).   Past Week   furosemide (LASIX) 40 MG tablet Take 40 mg by mouth daily as needed for fluid.   04/15/2023 at am   lenalidomide (REVLIMID) 25 MG capsule Take 1 capsule (25 mg total) by mouth daily. Take for 14 days,then none for 7 days.Repeat every 21 days. Auth #  [id number]  Date obtained 04/13/23 14 capsule 0 04/15/2023 at am   levothyroxine (SYNTHROID, LEVOTHROID) 100 MCG tablet Take 100 mcg by mouth daily before breakfast.  2 04/15/2023 at am   metoprolol succinate (TOPROL-XL) 25 MG 24 hr tablet Take 1 tablet (25 mg total) by mouth daily. 30 tablet 2 04/15/2023 at am   nystatin (MYCOSTATIN/NYSTOP) powder Apply 1 Application topically 3 (three) times daily. 15 g 0 Past Week   OVER THE COUNTER MEDICATION Take 1 capsule by mouth in the morning and at bedtime. AREDS   04/16/2023 at am   polyethylene glycol (MIRALAX / GLYCOLAX) 17 g packet Take 17 g by mouth daily as needed for mild constipation.   Past Week   potassium chloride (KLOR-CON M10) 10 MEQ tablet TAKE 2 TABLETS BY MOUTH 2 TIMES DAILY. 360 tablet 3 pm   pravastatin (PRAVACHOL) 40 MG tablet Take 40 mg by mouth every evening.   04/14/2023 at pm   sennosides-docusate sodium (SENOKOT-S) 8.6-50 MG tablet Take 1 tablet by mouth daily as needed for constipation.   04/15/2023 at am   guaiFENesin (MUCINEX) 600 MG 12 hr tablet Take 600 mg by mouth 2 (two) times daily. (Patient not taking: Reported on 04/16/2023)   Not Taking   Scheduled:   dapagliflozin propanediol  10 mg Oral Daily   feeding supplement  237 mL Oral BID BM   levothyroxine  100 mcg Oral QAC breakfast   loratadine  10 mg Oral Daily   metoprolol tartrate  50 mg Oral BID   pantoprazole  40 mg Oral Daily   pravastatin  40 mg Oral QPM   senna-docusate  1 tablet Oral Daily   sodium chloride flush  3 mL Intravenous Q12H   sucralfate  1 g Oral TID WC & HS   torsemide  20 mg Oral Daily   Infusions:   sodium chloride 10 mL/hr at 04/17/23 0100   [START ON 04/18/2023] sodium chloride     amiodarone 30 mg/hr (04/17/23 2109)   diltiazem (CARDIZEM) infusion 5 mg/hr (04/17/23 1426)   heparin 1,100 Units/hr (04/17/23 1010)    Assessment: 89yo female admitted for Afib w/ RVR and had converted to NSR > diltiazem infusion had been d/c'd and pt went back into  Afib w/ RVR, now awaiting cardiac cath, to transition from apixaban to UFH;  last dose of apixaban was given at 2130 on 6/24.  PTT therapeutic at 91 sec on heparin 1100 units/hr; heparin level > 1.1 as expected with DOAC on board. No bleeding reported.  Goal of Therapy:  Heparin level 0.3-0.7 units/ml aPTT 66-102 seconds Monitor platelets by anticoagulation protocol: Yes   Plan:  Continue heparin infusion at 1100 units/hr Check confirmatory aPTT with AM labs 6/26 Monitor heparin levels, aPTT (while DOAC affects anti-Xa assay), and CBC. Follow up resuming apixaban if no procedures planned  Loralee Pacas, PharmD, BCPS 04/17/2023,9:30 PM  Please check AMION for all [facility] Pharm[facility] phone numbers After 10:00 PM, call Main Pharmacy [phone]

## 2023-04-18 ENCOUNTER — Inpatient Hospital Stay (HOSPITAL_COMMUNITY): Payer: Medicare Other | Admitting: Anesthesiology

## 2023-04-18 ENCOUNTER — Encounter (HOSPITAL_COMMUNITY)
Admission: EM | Disposition: A | Discharge status: Skilled Nursing Facility | Payer: Self-pay | Source: Home / Self Care | Attending: Internal Medicine

## 2023-04-18 DIAGNOSIS — I509 Heart failure, unspecified: Secondary | ICD-10-CM

## 2023-04-18 DIAGNOSIS — I110 Hypertensive heart disease with heart failure: Secondary | ICD-10-CM

## 2023-04-18 DIAGNOSIS — E02 Subclinical iodine-deficiency hypothyroidism: Secondary | ICD-10-CM | POA: Diagnosis not present

## 2023-04-18 DIAGNOSIS — I4891 Unspecified atrial fibrillation: Secondary | ICD-10-CM

## 2023-04-18 DIAGNOSIS — E039 Hypothyroidism, unspecified: Secondary | ICD-10-CM

## 2023-04-18 DIAGNOSIS — I480 Paroxysmal atrial fibrillation: Secondary | ICD-10-CM

## 2023-04-18 DIAGNOSIS — I080 Rheumatic disorders of both mitral and aortic valves: Secondary | ICD-10-CM

## 2023-04-18 DIAGNOSIS — I5032 Chronic diastolic (congestive) heart failure: Secondary | ICD-10-CM | POA: Diagnosis not present

## 2023-04-18 HISTORY — PX: CARDIOVERSION: SHX1299

## 2023-04-18 LAB — CBC
HCT: 40.9 % (ref 36.0–46.0)
Hemoglobin: 13.4 g/dL (ref 12.0–15.0)
MCH: 28.3 pg (ref 26.0–34.0)
MCHC: 32.8 g/dL (ref 30.0–36.0)
MCV: 86.5 fL (ref 80.0–100.0)
Platelets: 141 10*3/uL — ABNORMAL LOW (ref 150–400)
RBC: 4.73 MIL/uL (ref 3.87–5.11)
RDW: 17.4 % — ABNORMAL HIGH (ref 11.5–15.5)
WBC: 11.1 10*3/uL — ABNORMAL HIGH (ref 4.0–10.5)
nRBC: 0 % (ref 0.0–0.2)

## 2023-04-18 LAB — BASIC METABOLIC PANEL
Anion gap: 17 — ABNORMAL HIGH (ref 5–15)
BUN: 27 mg/dL — ABNORMAL HIGH (ref 8–23)
CO2: 24 mmol/L (ref 22–32)
Calcium: 8.4 mg/dL — ABNORMAL LOW (ref 8.9–10.3)
Chloride: 92 mmol/L — ABNORMAL LOW (ref 98–111)
Creatinine, Ser: 0.92 mg/dL (ref 0.44–1.00)
GFR, Estimated: >60 mL/min (ref >60)
Glucose, Bld: 118 mg/dL — ABNORMAL HIGH (ref 70–99)
Potassium: 4 mmol/L (ref 3.5–5.1)
Sodium: 133 mmol/L — ABNORMAL LOW (ref 135–145)

## 2023-04-18 LAB — HEPARIN LEVEL (UNFRACTIONATED): Heparin Unfractionated: >1.1 IU/mL — ABNORMAL HIGH (ref 0.30–0.70)

## 2023-04-18 LAB — APTT: aPTT: 87 seconds — ABNORMAL HIGH (ref 24–36)

## 2023-04-18 SURGERY — CARDIOVERSION
Anesthesia: General

## 2023-04-18 MED ORDER — PROPOFOL 10 MG/ML IV BOLUS
INTRAVENOUS | Status: DC | PRN
  Administered 2023-04-18: 35 mg via INTRAVENOUS

## 2023-04-18 MED ORDER — SODIUM CHLORIDE 0.9 % IV SOLN: INTRAVENOUS | Status: DC

## 2023-04-18 MED ORDER — APIXABAN 5 MG PO TABS
5.0000 mg | ORAL_TABLET | Freq: Two times a day (BID) | ORAL | Status: DC
  Administered 2023-04-18 – 2023-04-24 (×13): 5 mg via ORAL
  Filled 2023-04-18 (×13): qty 1

## 2023-04-18 SURGICAL SUPPLY — 1 items: ELECT DEFIB PAD ADLT CADENCE (PAD) ×1 IMPLANT

## 2023-04-18 NOTE — Transfer of Care (Signed)
Immediate Anesthesia Transfer of Care Note  Patient: Zoe [name]  Procedure(s) Performed: CARDIOVERSION  Patient Location: PACU and Cath Lab  Anesthesia Type:General  Level of Consciousness: drowsy and responds to stimulation  Airway & Oxygen Therapy: Patient Spontanous Breathing  Post-op Assessment: Report given to RN and Post -op Vital signs reviewed and stable  Post vital signs: Reviewed and stable  Last Vitals:  Vitals Value Taken Time  BP 108/87 04/18/23 1355  Temp    Pulse 94 04/18/23 1356  Resp 29 04/18/23 1356  SpO2 93 04/18/23 1356    Last Pain:  Vitals:   04/18/23 1000  TempSrc:   PainSc: 0-No pain         Complications: No notable events documented.

## 2023-04-18 NOTE — Progress Notes (Addendum)
Progress Note   Patient: Zoe [name]:[id number] DOB: [birth year] DOA: 04/15/2023     2 DOS: the patient was seen and examined on 04/18/2023   Brief hospital course: Zoe [name] was admitted to the hospital with the working diagnosis of atrial fibrillation with rapid ventricular response.   89 yo female with the past medical history of atrial fibrillation, aortic stenosis sp TAVR, hypothyroid, hypertension, multiple myeloma who presented with palpitations. Reported dyspnea for 2 days. She had acute onset of palpitations, her blood pressure was 90/50 and HR 155, EMS was called and it was attempted direct current cardioversion 200 J, with converting to sinus rhythm. Her lungs were clear to auscultation, with no wheezing or rales, heart with S1 and S2 present and rhythmic, with no gallops, positive murmur at the right lower sternal border, abdomen with no distention and no lower extremity edema.   Na 133, K 3.5 Cl 93, bicarbonate 25 glucose 109, bun 33 cr 1,0 High sensitive troponin 263, 255, 120  Wbc 9,8 hgb 13.4 plt 184   Urine analysis SG 1,023, protein negative, > 500 glucose, negative leukocytes and negative Hgb.  Urine osmolality 694   Chest radiograph with cardiomegaly, no infiltrates or effusions, right base atelectasis.   EKG 86 bpm, normal axis, normal intervals, sinus rhythm with right atrial enlargement, no significant ST segment or T wave changes. Positive LVH.   Hip radiographs with no acute fractures.   06/25 last night patient converted back to atrial fibrillation with rapid ventricular response. She has been placed on IV diltiazem and IV amiodarone.  She had confusion this am, required bilateral mittens.  06/26 mentation has improved, plan for direct current cardioversion today.   Assessment and Plan: * Paroxysmal atrial fibrillation (HCC) Patient continue in atrial fibrillation with RVR, rate 120 to 140 bpm.   Continue on IV diltiazem and IV amiodarone for rate  control.  metoprolol 50 mg bid.  Anticoagulation with apixaban.  Out of bed to chair tid with meals Pt and Ot.   Elevated cardiac markers elevated, high sensitive troponin trending down. Possible elevation due to RVR.  Old records personally reviewed 2019 coronary angiography with normal coronaries.   For direct current cardioversion today.   Chronic diastolic CHF (congestive heart failure) (HCC) Echocardiogram with preserved LV systolic function with EF 60 to 65%, moderate LVH, RV systolic function preserved, RVSP 41.4 mmHg. Sp TAVR, LA with moderate dilatation, RA normal size, no pericardial effusion, no significant valvular disease.   Continue with SGLT 2 inh and metoprolol.  Rate control with diltiazem and amiodarone IV.  Diuresis with torsemide 20 mg po daily.  Hyponatremia Today renal function continue to be stable with serum cr at 0,92 with K at 4,0 and serum bicarbonate at 24.  Na 133.   Continue diuresis with loop diuretic. Follow up electrolytes and renal function in am.    Hypothyroidism Continue levothyroxine.  Hypertension Her blood pressure today is 104 to 130 mmHg.  Continue with metoprolol 50 mg po bid.   Hyperlipidemia Continue with statin therapy  History of stroke Resumed anticoagulation with apixaban.   Mild cognitive impairment. Intermittent confusion,  No clear encephalopathy. Patient at high risk of delirium.  Plan to continue close neuro checks, promote adequate sleep and nutrition.   Multiple myeloma not having achieved remission (HCC) Follow up as outpatient.         Subjective: Patient is feeling better, no chest pain or dyspnea, this am with no confusion or agitation.  Physical Exam: Vitals:   04/17/23 2041 04/17/23 2340 04/18/23 0745 04/18/23 1006  BP: (!) 157/100 118/70 104/82 135/77  Pulse: (!) 130 99 (!) 109   Resp: 17 17 20    Temp: 99.1 F (37.3 C) 98.5 F (36.9 C) 98.5 F (36.9 C)   TempSrc: Oral Oral Oral   SpO2:  96% 96% 98%   Weight:      Height:       Neurology awake and alert ENT with mild pallor Cardiovascular with S1 and S2 present, irregularly irregular with no gallops, rubs or murmurs No JVD No lower extremity edema Respiratory with no rales or wheezing, no rhonchi Abdomen with no distention  Data Reviewed:    Family Communication: no family at the bedside, but her care giver at the bedside   Disposition: Status is: Inpatient Remains inpatient appropriate because: direct current cardioversion   Planned Discharge Destination: Home    Author: Coralie Keens, MD 04/18/2023 11:25 AM  For on call review [email or web address].

## 2023-04-18 NOTE — Interval H&P Note (Signed)
History and Physical Interval Note:  04/18/2023 1:47 PM  Zoe [name]  has presented today for surgery, with the diagnosis of afib.  The various methods of treatment have been discussed with the patient and family. After consideration of risks, benefits and other options for treatment, the patient has consented to  Procedure(s): CARDIOVERSION (N/A) as a surgical intervention.  The patient's history has been reviewed, patient examined, no change in status, stable for surgery.  I have reviewed the patient's chart and labs.  Questions were answered to the patient's satisfaction.    Risks, benefits, and alternatives of direct current cardioversion reviewed with the and patient. Risk includes but not limited to: potential for post-cardioversion rhythms, life-threatening arrhythmias (ventricular tachycardia and fibrillation, profound bradycardia, cardiac arrest), myocardial damage, acute pulmonary edema, skin burns, transient hypotension. Benefits include restoration of sinus rhythm. Alternatives to treatment were discussed, questions were answered, patient voices understanding and provides verbal feedback.  Patient is willing to proceed.   Tessa Lerner, [place], [facility]  Pager:  [phone] Office: [phone]

## 2023-04-18 NOTE — Progress Notes (Signed)
Pt came back to rm 24 from cath lab on amio 16.7 ml/hr. Reinitiated tele. Obtained the VS. Call bell within reach.   Lawson Radar, RN

## 2023-04-18 NOTE — NC FL2 (Signed)
[place] MEDICAID FL2 LEVEL OF CARE FORM     IDENTIFICATION  Patient Name: Zoe [name] Birthdate: 08/07/1934 Sex: female Admission Date (Current Location): 04/15/2023  [facility] and [name] Number:  Producer, television/film/video and Address:  [facility] [place], [address], [place], [place] [address]      Provider Number: [id number]  Attending Physician Name and Address:  Coralie Keens  Relative Name and Phone Number:       Current Level of Care: SNF Recommended Level of Care: Skilled Nursing Facility Prior Approval Number:    Date Approved/Denied:   PASRR Number: [id number]  Discharge Plan: SNF    Current Diagnoses: Patient Active Problem List   Diagnosis Date Noted   Atrial fibrillation with rapid ventricular response (HCC) 04/18/2023   History of stroke 04/16/2023   Fall at home, initial encounter 04/16/2023   Atrial fibrillation (HCC) 04/16/2023   Paroxysmal atrial fibrillation (HCC) 04/15/2023   Elevated troponin 04/15/2023   Chest pain 04/13/2023   Stroke due to embolism (HCC) 03/20/2023   Chest pain, rule out acute myocardial infarction 02/17/2023   Chemotherapy-induced neuropathy (HCC) 11/28/2022   Pain due to onychomycosis of toenails of both feet 11/08/2022   Multiple myeloma not having achieved remission (HCC) 07/24/2022   Chronic diastolic CHF (congestive heart failure) (HCC) 05/16/2022   Symptomatic anemia 05/16/2022   Hyponatremia 04/02/2022   Microcytic anemia 04/02/2022   S/P TAVR (transcatheter aortic valve replacement) 09/03/2018   Hypothyroidism    Hypertension    Hyperlipidemia    GERD (gastroesophageal reflux disease)    S/P right TKA 06/18/2017   Class 1 obesity 03/15/2016   S/P left TKA 03/13/2016   History of breast cancer, DCIS, lumpectomy March 2000 12/13/2011    Orientation RESPIRATION BLADDER Height & Weight     Self  Normal Incontinent Weight: 151 lb 12.8 oz (68.9 kg) Height:  5\' 3"  (160 cm)   BEHAVIORAL SYMPTOMS/MOOD NEUROLOGICAL BOWEL NUTRITION STATUS      Continent Diet (please see discharge summary)  AMBULATORY STATUS COMMUNICATION OF NEEDS Skin   Limited Assist Verbally Normal                       Personal Care Assistance Level of Assistance  Bathing, Feeding, Dressing Bathing Assistance: Limited assistance Feeding assistance: Independent Dressing Assistance: Limited assistance     Functional Limitations Info  Sight, Hearing, Speech Sight Info:  (wears glasses) Hearing Info: Impaired Speech Info: Adequate    SPECIAL CARE FACTORS FREQUENCY  PT (By licensed PT), OT (By licensed OT)     PT Frequency: 5x per week OT Frequency: 5x per week            Contractures Contractures Info: Not present    Additional Factors Info  Code Status, Allergies Code Status Info: FULL Allergies Info: Penicillins,Sulfa Antibiotics           Current Medications (04/18/2023):  This is the current hospital active medication list Current Facility-Administered Medications  Medication Dose Route Frequency Provider Last Rate Last Admin   acetaminophen (TYLENOL) tablet 650 mg  650 mg Oral Q6H PRN Doutova, Anastassia, MD       Or   acetaminophen (TYLENOL) suppository 650 mg  650 mg Rectal Q6H PRN Doutova, Anastassia, MD       amiodarone (NEXTERONE PREMIX) 360-4.14 MG/200ML-% (1.8 mg/mL) IV infusion  30 mg/hr Intravenous Continuous Yates Decamp, MD 16.67 mL/hr at 04/18/23 1343 30 mg/hr at 04/18/23 1343  apixaban (ELIQUIS) tablet 5 mg  5 mg Oral BID Yates Decamp, MD   5 mg at 04/18/23 0732   dapagliflozin propanediol (FARXIGA) tablet 10 mg  10 mg Oral Daily Arrien, York Ram, MD   10 mg at 04/18/23 1002   diltiazem (CARDIZEM) 125 mg in dextrose 5% 125 mL (1 mg/mL) infusion  5-15 mg/hr Intravenous Titrated Tolia, Sunit, DO   Stopped at 04/18/23 1433   feeding supplement (ENSURE ENLIVE / ENSURE PLUS) liquid 237 mL  237 mL Oral BID BM Arrien, York Ram, MD   237 mL at  04/17/23 1638   HYDROcodone-acetaminophen (NORCO/VICODIN) 5-325 MG per tablet 1-2 tablet  1-2 tablet Oral Q4H PRN Therisa Doyne, MD   2 tablet at 04/17/23 2595   levothyroxine (SYNTHROID) tablet 100 mcg  100 mcg Oral QAC breakfast Therisa Doyne, MD   100 mcg at 04/18/23 0645   loratadine (CLARITIN) tablet 10 mg  10 mg Oral Daily Arrien, York Ram, MD   10 mg at 04/18/23 1003   metoprolol tartrate (LOPRESSOR) tablet 50 mg  50 mg Oral BID Yates Decamp, MD   50 mg at 04/18/23 1002   ondansetron (ZOFRAN) tablet 4 mg  4 mg Oral Q6H PRN Therisa Doyne, MD       Or   ondansetron (ZOFRAN) injection 4 mg  4 mg Intravenous Q6H PRN Doutova, Anastassia, MD       pantoprazole (PROTONIX) EC tablet 40 mg  40 mg Oral Daily Doutova, Anastassia, MD   40 mg at 04/18/23 1002   pravastatin (PRAVACHOL) tablet 40 mg  40 mg Oral QPM Doutova, Anastassia, MD   40 mg at 04/16/23 1658   senna-docusate (Senokot-S) tablet 1 tablet  1 tablet Oral Daily Doutova, Anastassia, MD   1 tablet at 04/17/23 1011   sodium chloride flush (NS) 0.9 % injection 3 mL  3 mL Intravenous Q12H Yates Decamp, MD   3 mL at 04/17/23 2111   sucralfate (CARAFATE) 1 GM/10ML suspension 1 g  1 g Oral TID WC & HS Arrien, York Ram, MD   1 g at 04/17/23 1228   torsemide (DEMADEX) tablet 20 mg  20 mg Oral Daily Yates Decamp, MD   20 mg at 04/18/23 1003     Discharge Medications: Please see discharge summary for a list of discharge medications.  Relevant Imaging Results:  Relevant Lab Results:   Additional Information SSN [id number]  Eduard Roux, LCSW

## 2023-04-18 NOTE — Progress Notes (Addendum)
OT Cancellation Note  Patient Details Name: Zoe [name] MRN: [id number] DOB: 07-28-1934   Cancelled Treatment:    Reason Eval/Treat Not Completed: Medical issues which prohibited therapy (Elevated HR, RN asking OT to hold today until after cardioversion, OT to follow up as schedule permits.)  1526: Pt back from cardioversion but per RN still in A fib RVR. Will hold today and follow up for OT evaluation tomorrow as schedule permits.  Dalphine Handing 04/18/2023, 10:47 AM

## 2023-04-18 NOTE — TOC Initial Note (Signed)
Transition of Care [facility]) - Initial/Assessment Note    Patient Details  Name: Zoe [name] MRN: [id number] Date of Birth: [birth year]  [facility] CM/SW Contact:    Eduard Roux, LCSW Phone Number: 04/18/2023, 3:41 PM  Clinical Narrative:                  CSW spoke with patient's daughter, [name] (son, Casimiro Needle on speaker phone). CSW introduced self and explained role. CSW informed family of recommendations for short term rehab at [facility]. Family reports patient's nephew is in the home with patient but is not there daily. Family states patient has gotten weaker and agrees with recommendations. CSW explained the SNF process. Preferred SNF is [facility].   TOC will provide bed offers once available  Antony Blackbird, MSW, LCSW Clinical Social Worker    Expected Discharge Plan: Skilled Nursing Facility Barriers to Discharge: Awaiting State Approval (PASRR), Insurance Authorization, Continued Medical Work up, SNF Pending bed offer   Patient Goals and CMS Choice            Expected Discharge Plan and Services In-house Referral: Clinical Social Work     Living arrangements for the past 2 months: Single Family Home                                      Prior Living Arrangements/Services Living arrangements for the past 2 months: Single Family Home Lives with:: Self          Need for Family Participation in Patient Care: Yes (Comment) Care giver support system in place?: Yes (comment)      Activities of Daily Living      Permission Sought/Granted                  Emotional Assessment       Orientation: : Oriented to Self, Oriented to Place, Oriented to  Time, Oriented to Situation Alcohol / Substance Use: Not Applicable Psych Involvement: No (comment)  Admission diagnosis:  Hyponatremia [E87.1] A-fib (HCC) [I48.91] Elevated troponin [R79.89] Atrial fibrillation with RVR (HCC) [I48.91] Atrial fibrillation (HCC) [I48.91] Patient  Active Problem List   Diagnosis Date Noted   Atrial fibrillation with rapid ventricular response (HCC) 04/18/2023   History of stroke 04/16/2023   Fall at home, initial encounter 04/16/2023   Atrial fibrillation (HCC) 04/16/2023   Paroxysmal atrial fibrillation (HCC) 04/15/2023   Elevated troponin 04/15/2023   Chest pain 04/13/2023   Stroke due to embolism (HCC) 03/20/2023   Chest pain, rule out acute myocardial infarction 02/17/2023   Chemotherapy-induced neuropathy (HCC) 11/28/2022   Pain due to onychomycosis of toenails of both feet 11/08/2022   Multiple myeloma not having achieved remission (HCC) 07/24/2022   Chronic diastolic CHF (congestive heart failure) (HCC) 05/16/2022   Symptomatic anemia 05/16/2022   Hyponatremia 04/02/2022   Microcytic anemia 04/02/2022   S/P TAVR (transcatheter aortic valve replacement) 09/03/2018   Hypothyroidism    Hypertension    Hyperlipidemia    GERD (gastroesophageal reflux disease)    S/P right TKA 06/18/2017   Class 1 obesity 03/15/2016   S/P left TKA 03/13/2016   History of breast cancer, DCIS, lumpectomy March 2000 12/13/2011   PCP:  Irena Reichmann, DO Pharmacy:   [phone] - [place], [place] - [address] AT [facility] [address] [place] [address] Phone: [phone] Fax: [phone]  [facility] [address], [place] - [address] [address] [place] [address] Phone: [phone] Fax: [phone]     Social Determinants of Health (SDOH) Social History: SDOH Screenings   Food Insecurity: No Food Insecurity (11/12/2018)  Transportation Needs: No Transportation Needs (11/12/2018)  Depression (PHQ2-9): Low Risk  (11/18/2018)  Financial Resource Strain: Low Risk  (11/12/2018)  Physical Activity: Inactive (11/12/2018)  Stress: Stress Concern Present (11/12/2018)  Tobacco Use: Low Risk  (04/15/2023)   SDOH Interventions:     Readmission Risk Interventions     No  data to display

## 2023-04-18 NOTE — Progress Notes (Signed)
PT Cancellation Note  Patient Details Name: Zoe [name] MRN: [id number] DOB: 1934/06/08   Cancelled Treatment:    Reason Eval/Treat Not Completed: (P) Medical issues which prohibited therapy (Pt continuing to have Afib, RN deferring mobility. Will continue efforts tomorrow as able.)   Johny Shock 04/18/2023, 4:14 PM

## 2023-04-18 NOTE — Progress Notes (Signed)
Pt has been intermittently confused, easily reoriented. Pt has been silencing IV pump whenever it was beeping. Educated, pt voiced understanding.  pt opened the Amiodarone channel because it was beeping. Channel locked. Patient needs constant reminder, and reorientation. Plan of care continues.

## 2023-04-18 NOTE — CV Procedure (Signed)
Direct current cardioversion:  Indications:  Atrial Fibrillation with rapid ventricular rate.   Procedure Details:  Consent: Risks of procedure as well as the alternatives and risks of each were explained to the (patient/caregiver).  Consent for procedure obtained.  Time Out: Verified patient identification, verified procedure, site/side was marked, verified correct patient position, special equipment/implants available, medications/allergies/relevent history reviewed, required imaging and test results available. PERFORMED.  Patient placed on cardiac monitor, pulse oximetry, supplemental oxygen as necessary.  Sedation given:  see anesthesia records  Pacer pads placed anterior and posterior chest.  Cardioverted 3 time(s).  Cardioversion with synchronized biphasic 200J shock - unsuccessful.   Evaluation: Findings: Post procedure EKG shows: Atrial Fibrillation Complications: None Patient did tolerate procedure well. Will restart Cardizem and Amiodarone.  Will discuss case / care w/ Dr. Jacinto Halim.   Anelise Staron 04/18/2023, 1:59 PM

## 2023-04-18 NOTE — Anesthesia Preprocedure Evaluation (Signed)
Anesthesia Evaluation  Patient identified by MRN, date of birth, ID band Patient awake    Reviewed: Allergy & Precautions, NPO status , Patient's Chart, lab work & pertinent test results  History of Anesthesia Complications Negative for: history of anesthetic complications  Airway Mallampati: III  TM Distance: >3 FB Neck ROM: Full    Dental  (+) Teeth Intact, Dental Advisory Given   Pulmonary neg pulmonary ROS   breath sounds clear to auscultation       Cardiovascular hypertension, Pt. on medications and Pt. on home beta blockers +CHF  + Valvular Problems/Murmurs  Rhythm:Irregular  1. Left ventricular ejection fraction, by estimation, is 60 to 65%. The  left ventricle has normal function. The left ventricle has no regional  wall motion abnormalities. There is moderate concentric left ventricular  hypertrophy. Left ventricular  diastolic parameters are consistent with Grade II diastolic dysfunction  (pseudonormalization). Elevated left ventricular end-diastolic pressure.   2. Right ventricular systolic function is normal. The right ventricular  size is normal. There is mildly elevated pulmonary artery systolic  pressure. The estimated right ventricular systolic pressure is 41.4 mmHg.   3. Left atrial size was moderately dilated.   4. The mitral valve is normal in structure. Mild mitral valve  regurgitation.   5. The aortic valve has been repaired/replaced. Aortic valve  regurgitation is mild. No aortic stenosis is present. There is a 23 mm 3  THV size 23 mm Sapien prosthetic (TAVR) valve present in the aortic  position. Procedure Date: 09/03/2018.   6. The inferior vena cava is normal in size with <50% respiratory  variability, suggesting right atrial pressure of 8 mmHg.     Neuro/Psych  Neuromuscular disease CVA    GI/Hepatic Neg liver ROS,GERD  ,,  Endo/Other  Hypothyroidism    Renal/GU Lab Results      Component                 Value               Date                      CREATININE               0.92                04/18/2023                Musculoskeletal  (+) Arthritis ,    Abdominal   Peds  Hematology  (+) Blood dyscrasia Lab Results      Component                Value               Date                      WBC                      11.1 (H)            04/18/2023                HGB                      13.4                04/18/2023                  HCT                      40.9                04/18/2023                MCV                      86.5                04/18/2023                PLT                      141 (L)             04/18/2023              eliquis   Anesthesia Other Findings   Reproductive/Obstetrics                              Anesthesia Physical Anesthesia Plan  ASA: 2  Anesthesia Plan: General   Post-op Pain Management: Minimal or no pain anticipated   Induction: Intravenous  PONV Risk Score and Plan: 3 and Treatment may vary due to age or medical condition  Airway Management Planned: Nasal Cannula, Natural Airway, Simple Face Mask and Mask  Additional Equipment: None  Intra-op Plan:   Post-operative Plan:   Informed Consent: I have reviewed the patients History and Physical, chart, labs and discussed the procedure including the risks, benefits and alternatives for the proposed anesthesia with the patient or authorized representative who has indicated his/her understanding and acceptance.     Dental advisory given  Plan Discussed with: CRNA  Anesthesia Plan Comments:          Anesthesia Quick Evaluation  

## 2023-04-18 NOTE — Progress Notes (Signed)
Subjective:  Patient feels better.  Dyspnea has improved.  Daughter present at bedside.  Intake/Output from previous day:  I/O last 3 completed shifts: In: 1945.4 [P.O.:670; I.V.:1225.4; IV Piggyback:50] Out: 800 [Urine:800] No intake/output data recorded. Net IO Since Admission: 2,700.46 mL [04/18/23 1043]  Blood pressure 135/77, pulse (!) 109, temperature 98.5 F (36.9 C), temperature source Oral, resp. rate 20, height 5\' 3"  (1.6 m), weight 68.9 kg, SpO2 98 %. Physical Exam Neck:     Vascular: No carotid bruit or JVD.  Cardiovascular:     Rate and Rhythm: Tachycardia present. Rhythm irregular.     Pulses: Normal pulses and intact distal pulses.     Heart sounds: No murmur heard. Pulmonary:     Effort: Pulmonary effort is normal.     Breath sounds: Examination of the right-lower field reveals rales. Examination of the left-lower field reveals rales. Rales present.  Abdominal:     General: Bowel sounds are normal.     Palpations: Abdomen is soft.  Musculoskeletal:     Right lower leg: No edema.     Left lower leg: No edema.  Skin:    Capillary Refill: Capillary refill takes less than 2 seconds.    Lab Results: Lab Results  Component Value Date   NA 133 (L) 04/18/2023   K 4.0 04/18/2023   CO2 24 04/18/2023   GLUCOSE 118 (H) 04/18/2023   BUN 27 (H) 04/18/2023   CREATININE 0.92 04/18/2023   CALCIUM 8.4 (L) 04/18/2023   EGFR 59 (L) 06/19/2022   GFRNONAA >60 04/18/2023    BNP (last 3 results) Recent Labs    06/19/22 1012 02/17/23 0922 04/13/23 0950  BNP 176.8* 454.3* 266.5*       Latest Ref Rng & Units 04/18/2023    1:31 AM 04/16/2023    4:00 AM 04/15/2023    6:05 PM  BMP  Glucose 70 - 99 mg/dL 295  284  132   BUN 8 - 23 mg/dL 27  29  33   Creatinine 0.44 - 1.00 mg/dL 4.40  1.02  7.25   Sodium 135 - 145 mmol/L 133  134  133   Potassium 3.5 - 5.1 mmol/L 4.0  3.5  3.5   Chloride 98 - 111 mmol/L 92  95  93   CO2 22 - 32 mmol/L 24  26  25    Calcium 8.9 - 10.3  mg/dL 8.4  7.8  7.9       Latest Ref Rng & Units 04/16/2023    4:00 AM 04/15/2023    9:45 PM 04/13/2023    9:07 AM  Hepatic Function  Total Protein 6.5 - 8.1 g/dL 4.6  4.9  5.4   Albumin 3.5 - 5.0 g/dL 2.3  2.4  3.2   AST 15 - 41 U/L 15  17  11    ALT 0 - 44 U/L 18  19  14    Alk Phosphatase 38 - 126 U/L 64  69  78   Total Bilirubin 0.3 - 1.2 mg/dL 1.1  0.8  1.1   Bilirubin, Direct 0.0 - 0.2 mg/dL  0.2        Latest Ref Rng & Units 04/18/2023    1:31 AM 04/16/2023    4:00 AM 04/15/2023    9:45 PM  CBC  WBC 4.0 - 10.5 K/uL 11.1  7.4  10.5   Hemoglobin 12.0 - 15.0 g/dL 36.6  44.0  34.7   Hematocrit 36.0 - 46.0 % 40.9  37.6  41.2  Platelets 150 - 400 K/uL 141  121  169    Lipid Panel     Component Value Date/Time   CHOL 174 03/30/2023 1415   CHOL 103 07/16/2019 0941   TRIG 76 03/30/2023 1415   HDL 59 03/30/2023 1415   HDL 48 07/16/2019 0941   CHOLHDL 2.9 03/30/2023 1415   VLDL 15 03/30/2023 1415   LDLCALC 100 (H) 03/30/2023 1415   LDLCALC 39 07/16/2019 0941   LDLDIRECT 48 07/16/2019 0941    Cardiac Panel (last 3 results) Recent Labs    04/15/23 2145 04/16/23 0010 04/16/23 0220  CKTOTAL 15*  --   --   TROPONINIHS 255* 120* 195*    HEMOGLOBIN A1C Lab Results  Component Value Date   HGBA1C 5.2 03/30/2023   MPG 102.54 03/30/2023   TSH Lab Results  Component Value Date   TSH 0.612 04/15/2023    Recent Labs    02/17/23 0922 04/15/23 2145  TSH 1.499 0.612    Radiology:     Cardiac Studies:    Echocardiogram 04/16/2023  1. Left ventricular ejection fraction, by estimation, is 60 to 65%. The left ventricle has normal function. The left ventricle has no regional wall motion abnormalities. There is moderate concentric left ventricular hypertrophy. Left ventricular  diastolic parameters are consistent with Grade II diastolic dysfunction (pseudonormalization). Elevated left ventricular end-diastolic pressure.  2. Right ventricular systolic function is normal.  The right ventricular size is normal. There is mildly elevated pulmonary artery systolic pressure. The estimated right ventricular systolic pressure is 41.4 mmHg.  3. Left atrial size was moderately dilated.  4. The mitral valve is normal in structure. Mild mitral valve regurgitation.  5. The aortic valve has been repaired/replaced. Aortic valve regurgitation is mild. No aortic stenosis is present. There is a 23 mm 3 THV size 23 mm Sapien prosthetic (TAVR) valve present in the aortic position. Procedure Date: 09/03/2018.  6. The inferior vena cava is normal in size with <50% respiratory variability, suggesting right atrial pressure of 8 mmHg.   Comparison(s): A prior study was performed on 02/17/2023. No significant change from prior study.   EKG:   EKG 04/16/2023: Normal sinus rhythm at the rate of 75 bpm, left atrial enlargement, otherwise normal EKG.   EKG 04/15/2023 at 1825: Normal sinus rhythm at the rate of 86 bpm, left atrial enlargement, otherwise normal EKG. EMS EKG 04/15/2023 at 1728 hrs.: Atrial fibrillation with rapid ventricular response, ventricular rate around 135 250 bpm.  Nonspecific ST-T changes.  Telemetry 04/18/2023: Atrial fibrillation with rapid ventricular response.   Allergies  Allergen Reactions   Penicillins Other (See Comments)    UNSPECIFIED REACTION  Patient does not remember reaction.  Has patient had a PCN reaction causing immediate rash, facial/tongue/throat swelling, SOB or lightheadedness with hypotension: no Has patient had a PCN reaction causing severe rash involving mucus membranes or skin necrosis: no Has patient had a PCN reaction that required hospitalization no Has patient had a PCN reaction occurring within the last 10 years: no If all of the above answers are "NO", then may proceed with Cephalosporin use.    Sulfa Antibiotics Other (See Comments)    UNSPECIFIED REACTION  "maybe vision issues? "    Scheduled Meds:  apixaban  5 mg Oral BID    dapagliflozin propanediol  10 mg Oral Daily   feeding supplement  237 mL Oral BID BM   levothyroxine  100 mcg Oral QAC breakfast   loratadine  10 mg Oral Daily  metoprolol tartrate  50 mg Oral BID   pantoprazole  40 mg Oral Daily   pravastatin  40 mg Oral QPM   senna-docusate  1 tablet Oral Daily   sodium chloride flush  3 mL Intravenous Q12H   sucralfate  1 g Oral TID WC & HS   torsemide  20 mg Oral Daily   Continuous Infusions:  sodium chloride 10 mL/hr at 04/17/23 0100   sodium chloride     amiodarone 30 mg/hr (04/17/23 2109)   diltiazem (CARDIZEM) infusion 5 mg/hr (04/17/23 1426)   PRN Meds:.sodium chloride, acetaminophen **OR** acetaminophen, HYDROcodone-acetaminophen, ondansetron **OR** ondansetron (ZOFRAN) IV, sodium chloride flush  Assessment  Zoe [name] is a 89 y.o. female patient with history of possibly mild coronary spasm by coronary angiogram in 2014,  severe AS s/p TAVR by Dr. Cornelius Moras on 09/03/2018, asymptomatic bilateral carotid artery stenosis,  essential hypertension, hyperlipidemia, hyperglycemia and GERD.  Patient also going through chemotherapy, presently diagnosed with multiple myeloma.    Now admitted with chest pain, A-fib with RVR.  Has had beaters of confusion, sundowning.  1.  Paroxysmal atrial fibrillation with rapid ventricular response 2.  Chest pain on breathing, clearly musculoskeletal, sternal tenderness present, could be a competent of multiple myeloma. 3.  Aortic valve replacement, aortic valve functioning normally. 4.  Type II MI, do not suspect coronary artery disease.  Cardiac catheterization has been canceled. 5.  Acute on chronic diastolic heart failure  Plan:   Patient is presently on IV amiodarone and has been rebolused.  Hopefully she will maintain sinus rhythm with cardioversion.  When she was admitted to the hospital on 04/16/2023, she got cardioverted by EMS in the truck after giving her Ativan.  As she continues to have A-fib with  RVR no EKG abnormalities to suggest ischemia, chest pain is musculoskeletal, do not suspect ACS.  I am beginning to wonder if chemotherapy has major role to play in the presentation which has been mild generalized weakness, fatigue, cough, chest pain syndrome.  Also wonder whether multiple myeloma is playing a component in sternal tenderness.  I have sent a message to hematology Dr. Leonides Schanz to discuss further, I would like to hold off on chemotherapy going forward to see whether she would do well and recovered well.  With regard to heart failure, continue torsemide 20 mg daily for now.  Presently on Farxiga as well.  Renal function has remained stable.  She will be scheduled for cardioversion this afternoon, patient has been kept NPO.  Incentive spirometry will be ordered.  Patient and family request home PT upon discharge which will be arranged.    Yates Decamp, MD, [facility] 04/18/2023, 10:43 AM Office: [phone] Fax: [phone] Pager: [phone]

## 2023-04-19 ENCOUNTER — Encounter (HOSPITAL_COMMUNITY): Payer: Self-pay | Admitting: Cardiology

## 2023-04-19 DIAGNOSIS — E871 Hypo-osmolality and hyponatremia: Secondary | ICD-10-CM | POA: Diagnosis not present

## 2023-04-19 DIAGNOSIS — I480 Paroxysmal atrial fibrillation: Secondary | ICD-10-CM | POA: Diagnosis not present

## 2023-04-19 DIAGNOSIS — I5033 Acute on chronic diastolic (congestive) heart failure: Secondary | ICD-10-CM | POA: Diagnosis present

## 2023-04-19 DIAGNOSIS — I484 Atypical atrial flutter: Secondary | ICD-10-CM

## 2023-04-19 DIAGNOSIS — I5032 Chronic diastolic (congestive) heart failure: Secondary | ICD-10-CM | POA: Diagnosis not present

## 2023-04-19 DIAGNOSIS — E02 Subclinical iodine-deficiency hypothyroidism: Secondary | ICD-10-CM | POA: Diagnosis not present

## 2023-04-19 LAB — BASIC METABOLIC PANEL
Anion gap: 10 (ref 5–15)
BUN: 27 mg/dL — ABNORMAL HIGH (ref 8–23)
CO2: 26 mmol/L (ref 22–32)
Calcium: 7.6 mg/dL — ABNORMAL LOW (ref 8.9–10.3)
Chloride: 95 mmol/L — ABNORMAL LOW (ref 98–111)
Creatinine, Ser: 1.08 mg/dL — ABNORMAL HIGH (ref 0.44–1.00)
GFR, Estimated: 50 mL/min — ABNORMAL LOW (ref >60)
Glucose, Bld: 132 mg/dL — ABNORMAL HIGH (ref 70–99)
Potassium: 3 mmol/L — ABNORMAL LOW (ref 3.5–5.1)
Sodium: 131 mmol/L — ABNORMAL LOW (ref 135–145)

## 2023-04-19 LAB — CBC
HCT: 37 % (ref 36.0–46.0)
Hemoglobin: 12.1 g/dL (ref 12.0–15.0)
MCH: 27.8 pg (ref 26.0–34.0)
MCHC: 32.7 g/dL (ref 30.0–36.0)
MCV: 85.1 fL (ref 80.0–100.0)
Platelets: 176 10*3/uL (ref 150–400)
RBC: 4.35 MIL/uL (ref 3.87–5.11)
RDW: 17.6 % — ABNORMAL HIGH (ref 11.5–15.5)
WBC: 8.4 10*3/uL (ref 4.0–10.5)
nRBC: 0 % (ref 0.0–0.2)

## 2023-04-19 LAB — MAGNESIUM: Magnesium: 1.9 mg/dL (ref 1.7–2.4)

## 2023-04-19 MED ORDER — POTASSIUM CHLORIDE CRYS ER 20 MEQ PO TBCR
40.0000 meq | EXTENDED_RELEASE_TABLET | ORAL | Status: AC
  Administered 2023-04-19 (×2): 40 meq via ORAL
  Filled 2023-04-19 (×2): qty 2

## 2023-04-19 MED ORDER — POTASSIUM CHLORIDE ER 10 MEQ PO TBCR
20.0000 meq | EXTENDED_RELEASE_TABLET | ORAL | Status: AC
  Administered 2023-04-19 (×2): 20 meq via ORAL
  Filled 2023-04-19 (×3): qty 2

## 2023-04-19 MED ORDER — ENSURE ENLIVE PO LIQD
237.0000 mL | Freq: Two times a day (BID) | ORAL | Status: DC
  Administered 2023-04-21 – 2023-04-22 (×2): 237 mL via ORAL

## 2023-04-19 MED ORDER — METOPROLOL TARTRATE 5 MG/5ML IV SOLN
5.0000 mg | Freq: Once | INTRAVENOUS | Status: AC
  Administered 2023-04-19: 5 mg via INTRAVENOUS
  Filled 2023-04-19: qty 5

## 2023-04-19 MED ORDER — DIGOXIN 0.25 MG/ML IJ SOLN
0.2500 mg | INTRAMUSCULAR | Status: AC
  Administered 2023-04-19 – 2023-04-20 (×3): 0.25 mg via INTRAVENOUS
  Filled 2023-04-19 (×3): qty 1

## 2023-04-19 MED ORDER — DIGOXIN 125 MCG PO TABS: 0.2500 mg | ORAL_TABLET | Freq: Every day | ORAL | Status: DC

## 2023-04-19 NOTE — Progress Notes (Signed)
Mobility Specialist Progress Note:    04/19/23 1512  Mobility  Activity Transferred to/from [facility]  Level of Assistance Minimal assist, patient does 75% or more  Assistive Device Other (Comment) (HHA)  Activity Response Tolerated well  Mobility Referral Yes  $Mobility charge 1 Mobility  Mobility Specialist Start Time (ACUTE ONLY) 1500  Mobility Specialist Stop Time (ACUTE ONLY) 1512  Mobility Specialist Time Calculation (min) (ACUTE ONLY) 12 min   Pt received on BSC, pt needed assistance transferring back to bed. Pt needed MinA with STS and bed mobility. No c/o throughout session. Call bell in hand, all needs met.  Thompson Grayer Mobility Specialist  Please contact vis Secure Chat or  Rehab Office [phone]

## 2023-04-19 NOTE — Progress Notes (Signed)
Patient's heart rate is in 130's, increased cardizem to 46ml/hr per order.

## 2023-04-19 NOTE — Progress Notes (Signed)
Initial Nutrition Assessment  DOCUMENTATION CODES:   Not applicable  INTERVENTION:  Ensure Enlive po BID, each supplement provides 350 kcal and 20 grams of protein.  Encourage po intake    NUTRITION DIAGNOSIS:   Increased nutrient needs related to cancer and cancer related treatments as evidenced by estimated needs.  GOAL:   Patient will meet greater than or equal to 90% of their needs  MONITOR:   PO intake, Supplement acceptance, Labs, Weight trends  REASON FOR ASSESSMENT:   Consult Assessment of nutrition requirement/status  ASSESSMENT:   Pt admitted d/t afib with RVR requiring cardioversion. PMH significant for afib, chronic diastolic CHF, AS s/p TAVR, hypothyroid, HTN, multiple myeloma not in remission.  Visited patient at bedside who reports poor appetite and that she strongly dislikes the food. Patient had many complaints about her trays and several other things. She reports her appetite was initially poor due to needing to have a bowel movement. She reports having a BM this morning.   RD offered to increase ONS frequency but she declined and wishes to keep the order the same. RD provided her an Ensure at bedside and encouraged her to drink it since she is not eating well. She complained of the sweetness and reports she will drink it anyway.   Patient appeared to be frustrated with clinical course and anxious about discharge plans regarding her insurance coverage.   No reported N/V/D. RD encouraged po intake and informed patient that her diet was changed to regular by MD.   Labs: Na 131, K+ 3, Cl 95, glu 132, BUN 27, Cr 1.08 Meds: protonix, KCl, pravachol, senokot, carafate, farxiga   NUTRITION - FOCUSED PHYSICAL EXAM:  Flowsheet Row Most Recent Value  Orbital Region No depletion  Upper Arm Region No depletion  Thoracic and Lumbar Region No depletion  Buccal Region No depletion  Temple Region No depletion  Clavicle Bone Region No depletion  Clavicle and  Acromion Bone Region No depletion  Scapular Bone Region No depletion  Dorsal Hand No depletion  Patellar Region No depletion  Anterior Thigh Region No depletion  Posterior Calf Region No depletion  Edema (RD Assessment) None  Hair Reviewed  Eyes Reviewed  Mouth Reviewed  Skin Reviewed  Nails Reviewed       Diet Order:   Diet Order             Diet regular Fluid consistency: Thin  Diet effective now                   EDUCATION NEEDS:   Education needs have been addressed  Skin:  Skin Assessment: Reviewed RN Assessment  Last BM:  6/27 type 4  Height:   Ht Readings from Last 1 Encounters:  04/17/23 5\' 3"  (1.6 m)    Weight:   Wt Readings from Last 1 Encounters:  04/17/23 68.9 kg   BMI:  Body mass index is 26.89 kg/m.  Estimated Nutritional Needs:   Kcal:  1600-1800  Protein:  85-100g  Fluid:  >/=1.6L  Leodis Rains, RDN, LDN  Clinical Nutrition

## 2023-04-19 NOTE — Progress Notes (Addendum)
Progress Note   Patient: Zoe [name] ZDG:[id number] DOB: Jun 24, 1934 DOA: 04/15/2023     3 DOS: the patient was seen and examined on 04/19/2023   Brief hospital course: Mrs. Yearwood was admitted to the hospital with the working diagnosis of atrial fibrillation with rapid ventricular response.   89 yo female with the past medical history of atrial fibrillation, aortic stenosis sp TAVR, hypothyroid, hypertension, multiple myeloma who presented with palpitations. Reported dyspnea for 2 days. She had acute onset of palpitations, her blood pressure was 90/50 and HR 155, EMS was called and it was attempted direct current cardioversion 200 J, with converting to sinus rhythm. Her lungs were clear to auscultation, with no wheezing or rales, heart with S1 and S2 present and rhythmic, with no gallops, positive murmur at the right lower sternal border, abdomen with no distention and no lower extremity edema.   Na 133, K 3.5 Cl 93, bicarbonate 25 glucose 109, bun 33 cr 1,0 High sensitive troponin 263, 255, 120  Wbc 9,8 hgb 13.4 plt 184   Urine analysis SG 1,023, protein negative, > 500 glucose, negative leukocytes and negative Hgb.  Urine osmolality 694   Chest radiograph with cardiomegaly, no infiltrates or effusions, right base atelectasis.   EKG 86 bpm, normal axis, normal intervals, sinus rhythm with right atrial enlargement, no significant ST segment or T wave changes. Positive LVH.   Hip radiographs with no acute fractures.   06/25 last night patient converted back to atrial fibrillation with rapid ventricular response. She has been placed on IV diltiazem and IV amiodarone.  She had confusion this am, required bilateral mittens.  06/26 mentation has improved, unsuccessful direct current cardioversion. 06/27 continue atrial fibrillation rhythm with improvement in rate control.   Assessment and Plan: * Paroxysmal atrial fibrillation (HCC) Unsuccessful direct current cardioversion, today  she continue in atrial fibrillation with rate 100 to 130, with occasional below 100.   Continue on IV diltiazem (rate currently at 5 mg/ hr and was up to 15 mg/hr before) and IV amiodarone for rate control.  metoprolol 50 mg bid.  Anticoagulation with apixaban.  Out of bed to chair tid with meals Pt and Ot.   Elevated cardiac markers elevated, high sensitive troponin, possible due to RVR.  Old records personally reviewed 2019 coronary angiography with normal coronaries.   Continue telemetry monitoring and amiodarone loading.   Chronic diastolic CHF (congestive heart failure) (HCC) Echocardiogram with preserved LV systolic function with EF 60 to 65%, moderate LVH, RV systolic function preserved, RVSP 41.4 mmHg. Sp TAVR, LA with moderate dilatation, RA normal size, no pericardial effusion, no significant valvular disease.   Continue with SGLT 2 inh and metoprolol.  Rate control with diltiazem and amiodarone IV.  Diuresis with torsemide 20 mg po daily.  Hyponatremia Hypokalemia. Hypomagnesemia   Renal function with serum cr at 1,0 with K at 3,0 and serum bicarbonate at 26. Na 131. Mg 1,9  Continue with torsemide.  Add Kcl 40 meq x 2 doses and one dose of IV mag 2 g.  Liberate diet and consult nutrition.  Add nutritional supplementation.     Hypothyroidism Continue levothyroxine.  Hypertension Her blood pressure today is 97 to 123 mmHg.  Continue with metoprolol 50 mg po bid.   Hyperlipidemia Continue with statin therapy  History of stroke Resumed anticoagulation with apixaban.   Mild cognitive impairment. Intermittent confusion,  No clear encephalopathy. Patient at high risk of delirium.  Plan to continue close neuro checks, promote adequate sleep  and nutrition.   Multiple myeloma not having achieved remission (HCC) Follow up as outpatient.         Subjective: Patient with no chest pain or dyspnea, no palpitations, no confusion or agitation   Physical  Exam: Vitals:   04/18/23 2200 04/18/23 2330 04/19/23 0300 04/19/23 0805  BP: 110/68 (!) 115/94 92/60 123/81  Pulse:    (!) 103  Resp:  20  18  Temp:  98.6 F (37 C)  98.2 F (36.8 C)  TempSrc:  Oral  Oral  SpO2:  92%  92%  Weight:      Height:       Neurology awake and alert ENT with mild pallor Cardiovascular with S1 and S2 present irregularly irregular with no gallops, rubs or murmurs No JVD No lower extremity edema Respiratory with no rales or wheezing, no rhonchi Abdomen with no distention  No lower extremity edema  Data Reviewed:    Family Communication: no family at the bedside   Disposition: Status is: Inpatient Remains inpatient appropriate because: persistent atrial fibrillation with RVR   Planned Discharge Destination: Home    Author: Coralie Keens, MD 04/19/2023 11:35 AM  For on call review [email or web address].

## 2023-04-19 NOTE — Progress Notes (Signed)
Patient's heart continues to stay in 130's increased Cardizem 24ml/hr per order.

## 2023-04-19 NOTE — H&P (View-Only) (Signed)
Subjective:  Patient feels better.  Dyspnea has improved.  Wants to be discharged to rehab, has questions about the medications.   Intake/Output from previous day:  I/O last 3 completed shifts: In: 995.5 [I.V.:995.5] Out: 1200 [Urine:1200] No intake/output data recorded. Net IO Since Admission: 2,961.94 mL [04/19/23 1720]  Blood pressure 101/68, pulse (!) 133, temperature 97.8 F (36.6 C), temperature source Oral, resp. rate 18, height 5\' 3"  (1.6 m), weight 68.9 kg, SpO2 95 %. Physical Exam Neck:     Vascular: No carotid bruit or JVD.  Cardiovascular:     Rate and Rhythm: Regular rhythm. Tachycardia present.     Pulses: Normal pulses and intact distal pulses.     Heart sounds: No murmur heard. Pulmonary:     Effort: Pulmonary effort is normal.     Breath sounds: Examination of the right-lower field reveals rales. Examination of the left-lower field reveals rales. Rales present.  Abdominal:     General: Bowel sounds are normal.     Palpations: Abdomen is soft.  Musculoskeletal:     Right lower leg: No edema.     Left lower leg: No edema.  Skin:    Capillary Refill: Capillary refill takes less than 2 seconds.    Lab Results: Lab Results  Component Value Date   NA 131 (L) 04/19/2023   K 3.0 (L) 04/19/2023   CO2 26 04/19/2023   GLUCOSE 132 (H) 04/19/2023   BUN 27 (H) 04/19/2023   CREATININE 1.08 (H) 04/19/2023   CALCIUM 7.6 (L) 04/19/2023   EGFR 59 (L) 06/19/2022   GFRNONAA 50 (L) 04/19/2023    BNP (last 3 results) Recent Labs    06/19/22 1012 02/17/23 0922 04/13/23 0950  BNP 176.8* 454.3* 266.5*        Latest Ref Rng & Units 04/19/2023    1:56 AM 04/18/2023    1:31 AM 04/16/2023    4:00 AM  BMP  Glucose 70 - 99 mg/dL 161  096  045   BUN 8 - 23 mg/dL 27  27  29    Creatinine 0.44 - 1.00 mg/dL 4.09  8.11  9.14   Sodium 135 - 145 mmol/L 131  133  134   Potassium 3.5 - 5.1 mmol/L 3.0  4.0  3.5   Chloride 98 - 111 mmol/L 95  92  95   CO2 22 - 32 mmol/L 26  24   26    Calcium 8.9 - 10.3 mg/dL 7.6  8.4  7.8       Latest Ref Rng & Units 04/16/2023    4:00 AM 04/15/2023    9:45 PM 04/13/2023    9:07 AM  Hepatic Function  Total Protein 6.5 - 8.1 g/dL 4.6  4.9  5.4   Albumin 3.5 - 5.0 g/dL 2.3  2.4  3.2   AST 15 - 41 U/L 15  17  11    ALT 0 - 44 U/L 18  19  14    Alk Phosphatase 38 - 126 U/L 64  69  78   Total Bilirubin 0.3 - 1.2 mg/dL 1.1  0.8  1.1   Bilirubin, Direct 0.0 - 0.2 mg/dL  0.2        Latest Ref Rng & Units 04/19/2023    1:56 AM 04/18/2023    1:31 AM 04/16/2023    4:00 AM  CBC  WBC 4.0 - 10.5 K/uL 8.4  11.1  7.4   Hemoglobin 12.0 - 15.0 g/dL 78.2  95.6  21.3   Hematocrit  36.0 - 46.0 % 37.0  40.9  37.6   Platelets 150 - 400 K/uL 176  141  121    Lipid Panel     Component Value Date/Time   CHOL 174 03/30/2023 1415   CHOL 103 07/16/2019 0941   TRIG 76 03/30/2023 1415   HDL 59 03/30/2023 1415   HDL 48 07/16/2019 0941   CHOLHDL 2.9 03/30/2023 1415   VLDL 15 03/30/2023 1415   LDLCALC 100 (H) 03/30/2023 1415   LDLCALC 39 07/16/2019 0941   LDLDIRECT 48 07/16/2019 0941    HEMOGLOBIN A1C Lab Results  Component Value Date   HGBA1C 5.2 03/30/2023   MPG 102.54 03/30/2023   TSH Lab Results  Component Value Date   TSH 0.612 04/15/2023    Recent Labs    02/17/23 0922 04/15/23 2145  TSH 1.499 0.612    Radiology:     Cardiac Studies:    Echocardiogram 04/16/2023  1. Left ventricular ejection fraction, by estimation, is 60 to 65%. The left ventricle has normal function. The left ventricle has no regional wall motion abnormalities. There is moderate concentric left ventricular hypertrophy. Left ventricular  diastolic parameters are consistent with Grade II diastolic dysfunction (pseudonormalization). Elevated left ventricular end-diastolic pressure.  2. Right ventricular systolic function is normal. The right ventricular size is normal. There is mildly elevated pulmonary artery systolic pressure. The estimated right  ventricular systolic pressure is 41.4 mmHg.  3. Left atrial size was moderately dilated.  4. The mitral valve is normal in structure. Mild mitral valve regurgitation.  5. The aortic valve has been repaired/replaced. Aortic valve regurgitation is mild. No aortic stenosis is present. There is a 23 mm 3 THV size 23 mm Sapien prosthetic (TAVR) valve present in the aortic position. Procedure Date: 09/03/2018.  6. The inferior vena cava is normal in size with <50% respiratory variability, suggesting right atrial pressure of 8 mmHg.   Comparison(s): A prior study was performed on 02/17/2023. No significant change from prior study.   EKG:   EKG 04/16/2023: Normal sinus rhythm at the rate of 75 bpm, left atrial enlargement, otherwise normal EKG.   EKG 04/15/2023 at 1825: Normal sinus rhythm at the rate of 86 bpm, left atrial enlargement, otherwise normal EKG. EMS EKG 04/15/2023 at 1728 hrs.: Atrial fibrillation with rapid ventricular response, ventricular rate around 135 250 bpm.  Nonspecific ST-T changes.  Telemetry 04/19/2023: Atrial flutter with 2: 1 conduction at the rate of 135 bpm.   Allergies  Allergen Reactions   Penicillins Other (See Comments)    UNSPECIFIED REACTION  Patient does not remember reaction.  Has patient had a PCN reaction causing immediate rash, facial/tongue/throat swelling, SOB or lightheadedness with hypotension: no Has patient had a PCN reaction causing severe rash involving mucus membranes or skin necrosis: no Has patient had a PCN reaction that required hospitalization no Has patient had a PCN reaction occurring within the last 10 years: no If all of the above answers are "NO", then may proceed with Cephalosporin use.    Sulfa Antibiotics Other (See Comments)    UNSPECIFIED REACTION  "maybe vision issues? "    Scheduled Meds:  apixaban  5 mg Oral BID   dapagliflozin propanediol  10 mg Oral Daily   digoxin  0.25 mg Intravenous Q4H   [START ON 04/20/2023] digoxin   0.25 mg Oral Daily   [START ON 04/20/2023] feeding supplement  237 mL Oral BID BM   levothyroxine  100 mcg Oral QAC breakfast   loratadine  10 mg Oral Daily   metoprolol tartrate  5 mg Intravenous Once   metoprolol tartrate  50 mg Oral BID   pantoprazole  40 mg Oral Daily   potassium chloride  20 mEq Oral Q1H   pravastatin  40 mg Oral QPM   senna-docusate  1 tablet Oral Daily   sodium chloride flush  3 mL Intravenous Q12H   sucralfate  1 g Oral TID WC & HS   torsemide  20 mg Oral Daily   Continuous Infusions:  amiodarone 30 mg/hr (04/19/23 0655)   diltiazem (CARDIZEM) infusion 15 mg/hr (04/19/23 1556)   PRN Meds:.acetaminophen **OR** acetaminophen, HYDROcodone-acetaminophen, ondansetron **OR** ondansetron (ZOFRAN) IV  Assessment  Zoe [name] is a 89 y.o. female patient with history of possibly mild coronary spasm by coronary angiogram in 2014,  severe AS s/p TAVR by Dr. Cornelius Moras on 09/03/2018, asymptomatic bilateral carotid artery stenosis,  essential hypertension, hyperlipidemia, hyperglycemia and GERD.  Patient also going through chemotherapy, presently diagnosed with multiple myeloma.    Now admitted with chest pain, A-fib with RVR.  Has had beaters of confusion, sundowning.  1.  Paroxysmal atrial fibrillation with rapid ventricular response/conversion to atrial flutter with 2: 1 conduction today 2.  Chest pain on breathing, clearly musculoskeletal, now improved  3.  Aortic valve replacement, aortic valve functioning normally. 4.  Acute on chronic diastolic heart failure  Plan:   With regard to persistent atrial fibrillation, she has now converted to atrial flutter with 2: 1 conduction which is a much more stable rhythm.  Continue IV amiodarone and I will add digoxin to rate control her, will give 1 dose of IV metoprolol 5 mg as blood pressure is now very stable.  If she persist to be in atrial flutter tomorrow morning, we will schedule for direct-current cardioversion.  Hopefully she will convert to sinus rhythm and we may still be able to discharge her to rehab as planned.  I have called her daughter and left a message regarding the plans.  Chest pain symptoms are improved.  Aortic valve prosthesis is functioning normally.  Mild heart failure symptoms persist, lungs are clear, presently on 20 mg of oral torsemide, continue the same along with Farxiga 10 mg daily.  Clear improvement in overall breathing status.  She is now appearing to be much more asymptomatic with regard to atrial fibrillation however rapid ventricular response persist and could lead to worsening diastolic function and heart failure hence need for cardioversion.  All questions answered.      Yates Decamp, MD, [facility] 04/19/2023, 5:20 PM Office: [phone] Fax: [phone] Pager: [phone]

## 2023-04-19 NOTE — Care Management Important Message (Signed)
Important Message  Patient Details  Name: Zoe [name] MRN: [id number] Date of Birth: 1933-12-17   Medicare Important Message Given:  Yes     Renie Ora 04/19/2023, 10:47 AM

## 2023-04-19 NOTE — Progress Notes (Signed)
Subjective:  Patient feels better.  Dyspnea has improved.  Wants to be discharged to rehab, has questions about the medications.   Intake/Output from previous day:  I/O last 3 completed shifts: In: 995.5 [I.V.:995.5] Out: 1200 [Urine:1200] No intake/output data recorded. Net IO Since Admission: 2,961.94 mL [04/19/23 1720]  Blood pressure 101/68, pulse (!) 133, temperature 97.8 F (36.6 C), temperature source Oral, resp. rate 18, height 5' 3" (1.6 m), weight 68.9 kg, SpO2 95 %. Physical Exam Neck:     Vascular: No carotid bruit or JVD.  Cardiovascular:     Rate and Rhythm: Regular rhythm. Tachycardia present.     Pulses: Normal pulses and intact distal pulses.     Heart sounds: No murmur heard. Pulmonary:     Effort: Pulmonary effort is normal.     Breath sounds: Examination of the right-lower field reveals rales. Examination of the left-lower field reveals rales. Rales present.  Abdominal:     General: Bowel sounds are normal.     Palpations: Abdomen is soft.  Musculoskeletal:     Right lower leg: No edema.     Left lower leg: No edema.  Skin:    Capillary Refill: Capillary refill takes less than 2 seconds.    Lab Results: Lab Results  Component Value Date   NA 131 (L) 04/19/2023   K 3.0 (L) 04/19/2023   CO2 26 04/19/2023   GLUCOSE 132 (H) 04/19/2023   BUN 27 (H) 04/19/2023   CREATININE 1.08 (H) 04/19/2023   CALCIUM 7.6 (L) 04/19/2023   EGFR 59 (L) 06/19/2022   GFRNONAA 50 (L) 04/19/2023    BNP (last 3 results) Recent Labs    06/19/22 1012 02/17/23 0922 04/13/23 0950  BNP 176.8* 454.3* 266.5*        Latest Ref Rng & Units 04/19/2023    1:56 AM 04/18/2023    1:31 AM 04/16/2023    4:00 AM  BMP  Glucose 70 - 99 mg/dL 132  118  112   BUN 8 - 23 mg/dL 27  27  29   Creatinine 0.44 - 1.00 mg/dL 1.08  0.92  0.91   Sodium 135 - 145 mmol/L 131  133  134   Potassium 3.5 - 5.1 mmol/L 3.0  4.0  3.5   Chloride 98 - 111 mmol/L 95  92  95   CO2 22 - 32 mmol/L 26  24   26   Calcium 8.9 - 10.3 mg/dL 7.6  8.4  7.8       Latest Ref Rng & Units 04/16/2023    4:00 AM 04/15/2023    9:45 PM 04/13/2023    9:07 AM  Hepatic Function  Total Protein 6.5 - 8.1 g/dL 4.6  4.9  5.4   Albumin 3.5 - 5.0 g/dL 2.3  2.4  3.2   AST 15 - 41 U/L 15  17  11   ALT 0 - 44 U/L 18  19  14   Alk Phosphatase 38 - 126 U/L 64  69  78   Total Bilirubin 0.3 - 1.2 mg/dL 1.1  0.8  1.1   Bilirubin, Direct 0.0 - 0.2 mg/dL  0.2        Latest Ref Rng & Units 04/19/2023    1:56 AM 04/18/2023    1:31 AM 04/16/2023    4:00 AM  CBC  WBC 4.0 - 10.5 K/uL 8.4  11.1  7.4   Hemoglobin 12.0 - 15.0 g/dL 12.1  13.4  12.2   Hematocrit   36.0 - 46.0 % 37.0  40.9  37.6   Platelets 150 - 400 K/uL 176  141  121    Lipid Panel     Component Value Date/Time   CHOL 174 03/30/2023 1415   CHOL 103 07/16/2019 0941   TRIG 76 03/30/2023 1415   HDL 59 03/30/2023 1415   HDL 48 07/16/2019 0941   CHOLHDL 2.9 03/30/2023 1415   VLDL 15 03/30/2023 1415   LDLCALC 100 (H) 03/30/2023 1415   LDLCALC 39 07/16/2019 0941   LDLDIRECT 48 07/16/2019 0941    HEMOGLOBIN A1C Lab Results  Component Value Date   HGBA1C 5.2 03/30/2023   MPG 102.54 03/30/2023   TSH Lab Results  Component Value Date   TSH 0.612 04/15/2023    Recent Labs    02/17/23 0922 04/15/23 2145  TSH 1.499 0.612    Radiology:     Cardiac Studies:    Echocardiogram 04/16/2023  1. Left ventricular ejection fraction, by estimation, is 60 to 65%. The left ventricle has normal function. The left ventricle has no regional wall motion abnormalities. There is moderate concentric left ventricular hypertrophy. Left ventricular  diastolic parameters are consistent with Grade II diastolic dysfunction (pseudonormalization). Elevated left ventricular end-diastolic pressure.  2. Right ventricular systolic function is normal. The right ventricular size is normal. There is mildly elevated pulmonary artery systolic pressure. The estimated right  ventricular systolic pressure is 41.4 mmHg.  3. Left atrial size was moderately dilated.  4. The mitral valve is normal in structure. Mild mitral valve regurgitation.  5. The aortic valve has been repaired/replaced. Aortic valve regurgitation is mild. No aortic stenosis is present. There is a 23 mm 3 THV size 23 mm Sapien prosthetic (TAVR) valve present in the aortic position. Procedure Date: 09/03/2018.  6. The inferior vena cava is normal in size with <50% respiratory variability, suggesting right atrial pressure of 8 mmHg.   Comparison(s): A prior study was performed on 02/17/2023. No significant change from prior study.   EKG:   EKG 04/16/2023: Normal sinus rhythm at the rate of 75 bpm, left atrial enlargement, otherwise normal EKG.   EKG 04/15/2023 at 1825: Normal sinus rhythm at the rate of 86 bpm, left atrial enlargement, otherwise normal EKG. EMS EKG 04/15/2023 at 1728 hrs.: Atrial fibrillation with rapid ventricular response, ventricular rate around 135 250 bpm.  Nonspecific ST-T changes.  Telemetry 04/19/2023: Atrial flutter with 2: 1 conduction at the rate of 135 bpm.   Allergies  Allergen Reactions   Penicillins Other (See Comments)    UNSPECIFIED REACTION  Patient does not remember reaction.  Has patient had a PCN reaction causing immediate rash, facial/tongue/throat swelling, SOB or lightheadedness with hypotension: no Has patient had a PCN reaction causing severe rash involving mucus membranes or skin necrosis: no Has patient had a PCN reaction that required hospitalization no Has patient had a PCN reaction occurring within the last 10 years: no If all of the above answers are "NO", then may proceed with Cephalosporin use.    Sulfa Antibiotics Other (See Comments)    UNSPECIFIED REACTION  "maybe vision issues? "    Scheduled Meds:  apixaban  5 mg Oral BID   dapagliflozin propanediol  10 mg Oral Daily   digoxin  0.25 mg Intravenous Q4H   [START ON 04/20/2023] digoxin   0.25 mg Oral Daily   [START ON 04/20/2023] feeding supplement  237 mL Oral BID BM   levothyroxine  100 mcg Oral QAC breakfast   loratadine    10 mg Oral Daily   metoprolol tartrate  5 mg Intravenous Once   metoprolol tartrate  50 mg Oral BID   pantoprazole  40 mg Oral Daily   potassium chloride  20 mEq Oral Q1H   pravastatin  40 mg Oral QPM   senna-docusate  1 tablet Oral Daily   sodium chloride flush  3 mL Intravenous Q12H   sucralfate  1 g Oral TID WC & HS   torsemide  20 mg Oral Daily   Continuous Infusions:  amiodarone 30 mg/hr (04/19/23 0655)   diltiazem (CARDIZEM) infusion 15 mg/hr (04/19/23 1556)   PRN Meds:.acetaminophen **OR** acetaminophen, HYDROcodone-acetaminophen, ondansetron **OR** ondansetron (ZOFRAN) IV  Assessment  Zoe [name] is a 89 y.o. female patient with history of possibly mild coronary spasm by coronary angiogram in 2014,  severe AS s/p TAVR by Dr. Owen on 09/03/2018, asymptomatic bilateral carotid artery stenosis,  essential hypertension, hyperlipidemia, hyperglycemia and GERD.  Patient also going through chemotherapy, presently diagnosed with multiple myeloma.    Now admitted with chest pain, A-fib with RVR.  Has had beaters of confusion, sundowning.  1.  Paroxysmal atrial fibrillation with rapid ventricular response/conversion to atrial flutter with 2: 1 conduction today 2.  Chest pain on breathing, clearly musculoskeletal, now improved  3.  Aortic valve replacement, aortic valve functioning normally. 4.  Acute on chronic diastolic heart failure  Plan:   With regard to persistent atrial fibrillation, she has now converted to atrial flutter with 2: 1 conduction which is a much more stable rhythm.  Continue IV amiodarone and I will add digoxin to rate control her, will give 1 dose of IV metoprolol 5 mg as blood pressure is now very stable.  If she persist to be in atrial flutter tomorrow morning, we will schedule for direct-current cardioversion.  Hopefully she will convert to sinus rhythm and we may still be able to discharge her to rehab as planned.  I have called her daughter and left a message regarding the plans.  Chest pain symptoms are improved.  Aortic valve prosthesis is functioning normally.  Mild heart failure symptoms persist, lungs are clear, presently on 20 mg of oral torsemide, continue the same along with Farxiga 10 mg daily.  Clear improvement in overall breathing status.  She is now appearing to be much more asymptomatic with regard to atrial fibrillation however rapid ventricular response persist and could lead to worsening diastolic function and heart failure hence need for cardioversion.  All questions answered.      Dymir Neeson, MD, FACC 04/19/2023, 5:20 PM Office: [phone] Fax: [phone] Pager: [phone]   

## 2023-04-19 NOTE — Progress Notes (Signed)
PT Cancellation Note  Patient Details Name: Zoe [name] MRN: [id number] DOB: June 19, 1934   Cancelled Treatment:    Reason Eval/Treat Not Completed: Other (comment) (pt on BSC on arrival, still attempting BM after 10 min. Will return as time allows)   Hend Mccarrell B Kaya Pottenger 04/19/2023, 7:39 AM Merryl Hacker, PT Acute Rehabilitation Services Office: [phone]

## 2023-04-19 NOTE — Evaluation (Signed)
Occupational Therapy Evaluation Patient Details Name: Zoe [name] MRN: [id number] DOB: [birth year] Today's Date: 04/19/2023   History of Present Illness 89 y.o. female admitted 04/15/23 with Afib with RVR; cardioversion attempted; reports sacral pain s/p recent fall in garden; reports a second recent fall due to Rt knee buckling. 6/26 cardioversion with return to Afib.  PMHx: A-fib on Eliquis and amiodarone, hypothyroidism, HTN, breast CA, multiple myeloma, aortic stenosis s/p TAVR, TIA   Clinical Impression   Patient admitted for the diagnosis above.  PTA she lives at home with her grandson, who is in school, and assists with home management.  Patient with fair dynamic balance, weakness and decreased activity tolerance, currently needing Min A for ADL completion and basic transfers.  Patient does not have the 24 hour Min A at home to safely transition there.  OT is indicated in the acute setting to address deficits, and Patient will benefit from continued inpatient follow up therapy, <3 hours/day       Recommendations for follow up therapy are one component of a multi-disciplinary discharge planning process, led by the attending physician.  Recommendations may be updated based on patient status, additional functional criteria and insurance authorization.   Assistance Recommended at Discharge Frequent or constant Supervision/Assistance  Patient can return home with the following Help with stairs or ramp for entrance;Assist for transportation;Assistance with cooking/housework;A little help with walking and/or transfers;A little help with bathing/dressing/bathroom    Functional Status Assessment  Patient has had a recent decline in their functional status and demonstrates the ability to make significant improvements in function in a reasonable and predictable amount of time.  Equipment Recommendations  None recommended by OT    Recommendations for Other Services       Precautions /  Restrictions Precautions Precautions: Fall Precaution Comments: watch HR Restrictions Weight Bearing Restrictions: No      Mobility Bed Mobility Overal bed mobility: Needs Assistance Bed Mobility: Sit to Supine       Sit to supine: Min assist        Transfers Overall transfer level: Needs assistance Equipment used: Rolling walker (2 wheels) Transfers: Sit to/from Stand Sit to Stand: Min assist                  Balance Overall balance assessment: Needs assistance Sitting-balance support: Feet supported Sitting balance-Leahy Scale: Good     Standing balance support: Reliant on assistive device for balance Standing balance-Leahy Scale: Fair                             ADL either performed or assessed with clinical judgement   ADL Overall ADL's : Needs assistance/impaired Eating/Feeding: Independent;Sitting   Grooming: Wash/dry hands;Wash/dry face;Set up;Sitting   Upper Body Bathing: Set up;Sitting   Lower Body Bathing: Minimal assistance;Sit to/from stand   Upper Body Dressing : Set up;Sitting   Lower Body Dressing: Minimal assistance;Sit to/from stand   Toilet Transfer: Minimal assistance;BSC/3in1;Rolling walker (2 wheels)                   Vision Patient Visual Report: No change from baseline       Perception     Praxis      Pertinent Vitals/Pain Pain Assessment Pain Assessment: Faces Faces Pain Scale: Hurts a little bit Pain Location: Right leg Pain Descriptors / Indicators: Tender Pain Intervention(s): Monitored during session     Hand Dominance Right   Extremity/Trunk Assessment Upper Extremity  Assessment Upper Extremity Assessment: Overall WFL for tasks assessed   Lower Extremity Assessment Lower Extremity Assessment: Defer to PT evaluation       Communication Communication Communication: HOH   Cognition Arousal/Alertness: Awake/alert Behavior During Therapy: WFL for tasks assessed/performed Overall  Cognitive Status: No family/caregiver present to determine baseline cognitive functioning                                       General Comments       Exercises     Shoulder Instructions      Home Living Family/patient expects to be discharged to:: Skilled nursing facility   Available Help at Discharge: Family;Available PRN/intermittently Type of Home: House Home Access: Stairs to enter [employer] of Steps: 1 Entrance Stairs-Rails: None Home Layout: One level     Bathroom Shower/Tub: Producer, television/film/video: Handicapped height Bathroom Accessibility: Yes   Home Equipment: Shower seat;Cane - single point;Rolling Environmental consultant (2 wheels);Rollator (4 wheels);Toilet riser          Prior Functioning/Environment Prior Level of Function : Needs assist             Mobility Comments: Rollator for ambulation, grandson provide transportation ADLs Comments: grandson assists with housework        OT Problem List: Decreased strength;Decreased activity tolerance;Impaired balance (sitting and/or standing);Pain      OT Treatment/Interventions: Self-care/ADL training;Therapeutic activities;Patient/family education;Balance training;DME and/or AE instruction    OT Goals(Current goals can be found in the care plan section) Acute Rehab OT Goals Patient Stated Goal: Rehab OT Goal Formulation: With patient Time For Goal Achievement: 05/03/23 Potential to Achieve Goals: Good ADL Goals Pt Will Perform Grooming: with modified independence;standing Pt Will Perform Lower Body Dressing: with modified independence;sit to/from stand Pt Will Transfer to Toilet: with modified independence;regular height toilet;ambulating  OT Frequency: Min 2X/week    Co-evaluation              AM-PAC OT "6 Clicks" Daily Activity     Outcome Measure Help from another person eating meals?: None Help from another person taking care of personal grooming?: None Help  from another person toileting, which includes using toliet, bedpan, or urinal?: A Little Help from another person bathing (including washing, rinsing, drying)?: A Little Help from another person to put on and taking off regular upper body clothing?: None Help from another person to put on and taking off regular lower body clothing?: A Little 6 Click Score: 21   End of Session Equipment Utilized During Treatment: Rolling walker (2 wheels) Nurse Communication: Mobility status  Activity Tolerance: Patient tolerated treatment well Patient left: in bed;with call bell/phone within reach  OT Visit Diagnosis: Unsteadiness on feet (R26.81);Muscle weakness (generalized) (M62.81);History of falling (Z91.81)                Time: 1207-1229 OT Time Calculation (min): 22 min Charges:  OT General Charges $OT Visit: 1 Visit OT Evaluation $OT Eval Moderate Complexity: 1 Mod  04/19/2023  RP, OTR/L  Acute Rehabilitation Services  Office:  [phone]   Suzanna Obey 04/19/2023, 1:09 PM

## 2023-04-19 NOTE — Progress Notes (Signed)
Patient's heart rate in 130's notified MD. Patient asymptomatic and BP 101/68.

## 2023-04-19 NOTE — Progress Notes (Signed)
Physical Therapy Treatment Patient Details Name: Zoe [name] MRN: [id number] DOB: Aug 15, 1934 Today's Date: 04/19/2023   History of Present Illness 89 y.o. female admitted 04/15/23 with Afib with RVR; cardioversion attempted; reports sacral pain s/p recent fall in garden; reports a second recent fall due to Rt knee buckling. 6/26 cardioversion with return to Afib.  PMHx: A-fib on Eliquis and amiodarone, hypothyroidism, HTN, breast CA, multiple myeloma, aortic stenosis s/p TAVR, TIA    PT Comments    Pt concerned over beeping IV, tray not being as she ordered and not wanting to attempt excessive activity. Pt with HR 120-132 throughout session in Afib with cues for all transfers and mobility. Pt encouraged to be OOB for meal and continue progressive mobility as able in prep for eventual return home. Will continue to follow.     Recommendations for follow up therapy are one component of a multi-disciplinary discharge planning process, led by the attending physician.  Recommendations may be updated based on patient status, additional functional criteria and insurance authorization.  Follow Up Recommendations  Can patient physically be transported by private vehicle: Yes    Assistance Recommended at Discharge Intermittent Supervision/Assistance  Patient can return home with the following A little help with walking and/or transfers;A little help with bathing/dressing/bathroom;Assistance with cooking/housework;Direct supervision/assist for medications management;Direct supervision/assist for financial management;Assist for transportation;Help with stairs or ramp for entrance   Equipment Recommendations  None recommended by PT    Recommendations for Other Services       Precautions / Restrictions Precautions Precautions: Fall Precaution Comments: watch HR Restrictions Weight Bearing Restrictions: No     Mobility  Bed Mobility Overal bed mobility: Needs Assistance Bed Mobility:  Supine to Sit     Supine to sit: Min guard     General bed mobility comments: HOB 10 degrees, pt reaching out for HHA to lift trunk, able to achieve with cues    Transfers Overall transfer level: Needs assistance   Transfers: Sit to/from Stand Sit to Stand: Min assist           General transfer comment: min assist to stand from EOB x 2 trials. pt with initial report of dizziness and needing so sit with bP stable and able to reattempt    Ambulation/Gait Ambulation/Gait assistance: Min guard Gait Distance (Feet): 15 Feet Assistive device: Rolling walker (2 wheels) Gait Pattern/deviations: Step-through pattern, Decreased stride length   Gait velocity interpretation: 1.31 - 2.62 ft/sec, indicative of limited community ambulator   General Gait Details: pt walked around bed with rW with HR maintained 130 Afib, pt denied further activity or gait   Stairs             Wheelchair Mobility    Modified Rankin (Stroke Patients Only)       Balance Overall balance assessment: History of Falls                                          Cognition Arousal/Alertness: Awake/alert Behavior During Therapy: WFL for tasks assessed/performed Overall Cognitive Status: No family/caregiver present to determine baseline cognitive functioning                                 General Comments: some limited insight into when to ask for assist and expectations of acute care  Exercises      General Comments        Pertinent Vitals/Pain Pain Assessment Pain Assessment: No/denies pain    Home Living                          Prior Function            PT Goals (current goals can now be found in the care plan section) Progress towards PT goals: Not progressing toward goals - comment (limited by HR)    Frequency    Min 1X/week      PT Plan Current plan remains appropriate    Co-evaluation              AM-PAC PT  "6 Clicks" Mobility   Outcome Measure  Help needed turning from your back to your side while in a flat bed without using bedrails?: None Help needed moving from lying on your back to sitting on the side of a flat bed without using bedrails?: A Little Help needed moving to and from a bed to a chair (including a wheelchair)?: A Little Help needed standing up from a chair using your arms (e.g., wheelchair or bedside chair)?: A Little Help needed to walk in hospital room?: A Little Help needed climbing 3-5 steps with a railing? : A Lot 6 Click Score: 18    End of Session   Activity Tolerance: Patient tolerated treatment well Patient left: in chair;with call bell/phone within reach;with chair alarm set Nurse Communication: Mobility status PT Visit Diagnosis: History of falling (Z91.81);Difficulty in walking, not elsewhere classified (R26.2);Pain     Time: 1610-9604 PT Time Calculation (min) (ACUTE ONLY): 22 min  Charges:  $Gait Training: 8-22 mins                     Zoe [name], PT Acute Rehabilitation Services Office: [phone]    Enedina Finner Nirvi Boehler 04/19/2023, 12:08 PM

## 2023-04-20 ENCOUNTER — Inpatient Hospital Stay: Payer: Medicare Other | Admitting: Hematology and Oncology

## 2023-04-20 ENCOUNTER — Inpatient Hospital Stay: Payer: Medicare Other

## 2023-04-20 ENCOUNTER — Encounter (HOSPITAL_COMMUNITY): Payer: Self-pay | Admitting: Internal Medicine

## 2023-04-20 ENCOUNTER — Encounter (HOSPITAL_COMMUNITY)
Admission: EM | Disposition: A | Discharge status: Skilled Nursing Facility | Payer: Self-pay | Source: Home / Self Care | Attending: Internal Medicine

## 2023-04-20 ENCOUNTER — Inpatient Hospital Stay (HOSPITAL_COMMUNITY): Payer: Medicare Other | Admitting: Registered Nurse

## 2023-04-20 ENCOUNTER — Other Ambulatory Visit (HOSPITAL_COMMUNITY): Payer: Self-pay

## 2023-04-20 ENCOUNTER — Other Ambulatory Visit: Payer: Self-pay

## 2023-04-20 DIAGNOSIS — I5033 Acute on chronic diastolic (congestive) heart failure: Secondary | ICD-10-CM | POA: Diagnosis not present

## 2023-04-20 DIAGNOSIS — I480 Paroxysmal atrial fibrillation: Secondary | ICD-10-CM | POA: Diagnosis not present

## 2023-04-20 DIAGNOSIS — I4819 Other persistent atrial fibrillation: Secondary | ICD-10-CM

## 2023-04-20 DIAGNOSIS — I4891 Unspecified atrial fibrillation: Secondary | ICD-10-CM | POA: Diagnosis not present

## 2023-04-20 DIAGNOSIS — E871 Hypo-osmolality and hyponatremia: Secondary | ICD-10-CM | POA: Diagnosis not present

## 2023-04-20 HISTORY — PX: CARDIOVERSION: SHX1299

## 2023-04-20 LAB — BASIC METABOLIC PANEL
Anion gap: 9 (ref 5–15)
BUN: 26 mg/dL — ABNORMAL HIGH (ref 8–23)
CO2: 26 mmol/L (ref 22–32)
Calcium: 7.8 mg/dL — ABNORMAL LOW (ref 8.9–10.3)
Chloride: 97 mmol/L — ABNORMAL LOW (ref 98–111)
Creatinine, Ser: 1.04 mg/dL — ABNORMAL HIGH (ref 0.44–1.00)
GFR, Estimated: 52 mL/min — ABNORMAL LOW (ref >60)
Glucose, Bld: 108 mg/dL — ABNORMAL HIGH (ref 70–99)
Potassium: 3.6 mmol/L (ref 3.5–5.1)
Sodium: 132 mmol/L — ABNORMAL LOW (ref 135–145)

## 2023-04-20 LAB — CBC
HCT: 38.1 % (ref 36.0–46.0)
Hemoglobin: 12.3 g/dL (ref 12.0–15.0)
MCH: 27.5 pg (ref 26.0–34.0)
MCHC: 32.3 g/dL (ref 30.0–36.0)
MCV: 85 fL (ref 80.0–100.0)
Platelets: 158 10*3/uL (ref 150–400)
RBC: 4.48 MIL/uL (ref 3.87–5.11)
RDW: 17.6 % — ABNORMAL HIGH (ref 11.5–15.5)
WBC: 6.4 10*3/uL (ref 4.0–10.5)
nRBC: 0 % (ref 0.0–0.2)

## 2023-04-20 LAB — MAGNESIUM: Magnesium: 1.8 mg/dL (ref 1.7–2.4)

## 2023-04-20 SURGERY — CARDIOVERSION
Anesthesia: General

## 2023-04-20 MED ORDER — MAGNESIUM SULFATE 2 GM/50ML IV SOLN
2.0000 g | Freq: Once | INTRAVENOUS | Status: AC
  Administered 2023-04-20: 2 g via INTRAVENOUS
  Filled 2023-04-20: qty 50

## 2023-04-20 MED ORDER — LIDOCAINE 2% (20 MG/ML) 5 ML SYRINGE
INTRAMUSCULAR | Status: DC | PRN
  Administered 2023-04-20: 50 mg via INTRAVENOUS

## 2023-04-20 MED ORDER — DIGOXIN 125 MCG PO TABS
0.1250 mg | ORAL_TABLET | Freq: Every day | ORAL | 1 refills | Status: DC
  Filled 2023-04-20: qty 30, 30d supply, fill #0

## 2023-04-20 MED ORDER — TORSEMIDE 20 MG PO TABS
20.0000 mg | ORAL_TABLET | Freq: Every day | ORAL | 1 refills | Status: DC
  Filled 2023-04-20: qty 30, 30d supply, fill #0

## 2023-04-20 MED ORDER — EPHEDRINE SULFATE (PRESSORS) 50 MG/ML IJ SOLN
INTRAMUSCULAR | Status: DC | PRN
  Administered 2023-04-20: 5 mg via INTRAVENOUS

## 2023-04-20 MED ORDER — POTASSIUM CHLORIDE CRYS ER 20 MEQ PO TBCR
40.0000 meq | EXTENDED_RELEASE_TABLET | Freq: Once | ORAL | Status: AC
  Administered 2023-04-20: 40 meq via ORAL
  Filled 2023-04-20: qty 2

## 2023-04-20 MED ORDER — AMIODARONE HCL 200 MG PO TABS
200.0000 mg | ORAL_TABLET | Freq: Every day | ORAL | 1 refills | Status: DC
Start: 2023-04-20 — End: 2023-10-23

## 2023-04-20 MED ORDER — METOPROLOL TARTRATE 50 MG PO TABS
50.0000 mg | ORAL_TABLET | Freq: Two times a day (BID) | ORAL | 1 refills | Status: DC
  Filled 2023-04-20: qty 60, 30d supply, fill #0

## 2023-04-20 MED ORDER — SODIUM CHLORIDE 0.9 % IV SOLN: INTRAVENOUS | Status: DC

## 2023-04-20 MED ORDER — PROPOFOL 10 MG/ML IV BOLUS
INTRAVENOUS | Status: DC | PRN
  Administered 2023-04-20: 50 mg via INTRAVENOUS

## 2023-04-20 MED ORDER — AMIODARONE HCL 200 MG PO TABS
200.0000 mg | ORAL_TABLET | Freq: Every day | ORAL | Status: DC
  Administered 2023-04-20 – 2023-04-24 (×5): 200 mg via ORAL
  Filled 2023-04-20 (×5): qty 1

## 2023-04-20 SURGICAL SUPPLY — 1 items: ELECT DEFIB PAD ADLT CADENCE (PAD) ×1 IMPLANT

## 2023-04-20 NOTE — Transfer of Care (Signed)
Immediate Anesthesia Transfer of Care Note  Patient: Zoe [name]  Procedure(s) Performed: CARDIOVERSION  Patient Location: PACU and Cath Lab  Anesthesia Type:MAC  Level of Consciousness: drowsy  Airway & Oxygen Therapy: Patient Spontanous Breathing  Post-op Assessment: Report given to RN and Post -op Vital signs reviewed and stable  Post vital signs: Reviewed and stable  Last Vitals:  Vitals Value Taken Time  BP 111/50   Temp    Pulse 51 04/20/23 0757  Resp 23 04/20/23 0757  SpO2 93 % 04/20/23 0757  Vitals shown include unvalidated device data.  Last Pain:  Vitals:   04/20/23 0735  TempSrc: Temporal  PainSc: 0-No pain         Complications: There were no known notable events for this encounter.

## 2023-04-20 NOTE — CV Procedure (Signed)
Direct current cardioversion 04/20/2023 7:55 AM  Indication symptomatic A. Fibrillation.  Procedure: Using 50 mg of IV Propofol and 50 IV Lidocaine (for reducing venous pain) for achieving deep sedation, synchronized direct current cardioversion performed. Patient was delivered with 200 Joules of electricity X 1 with success to NSR. Patient tolerated the procedure well. No immediate complication noted.    Yates Decamp, MD, [facility] 04/20/2023, 7:55 AM Office: [phone] Fax: [phone] Pager: [phone]

## 2023-04-20 NOTE — Progress Notes (Signed)
Can be discharged home/rehab, I have reconciled cardiac medications.

## 2023-04-20 NOTE — Interval H&P Note (Signed)
History and Physical Interval Note:  04/20/2023 7:46 AM  Zoe [name]  has presented today for surgery, with the diagnosis of Persistent atrial fibrillation.  The various methods of treatment have been discussed with the patient and family. After consideration of risks, benefits and other options for treatment, the patient has consented to  Procedure(s): CARDIOVERSION (N/A) as a surgical intervention.  The patient's history has been reviewed, patient examined, no change in status, stable for surgery.  I have reviewed the patient's chart and labs.  Questions were answered to the patient's satisfaction.     Yates Decamp

## 2023-04-20 NOTE — TOC Progression Note (Signed)
Transition of Care [facility]) - Progression Note    Patient Details  Name: Zoe [name] MRN: [id number] Date of Birth: 10-Nov-1933  Transition of Care [facility]) CM/SW Contact  Eduard Roux, [place] Phone Number: 04/20/2023, 11:41 AM  Clinical Narrative:     CSW spoke with the patient's daughter, Zoe [name]. CSW sent text message of the names of SNF rehab offers as requested. Family will review and provide top two choices of preferred facilities.   CSW met with patient at bedside. Introduced self and explained role. CSW provided patient with the SNF bed offers list along with the medicare star ratings.   TOC will continue to follow and assist with discharge planning.  Antony Blackbird, MSW, LCSW Clinical Social Worker    Expected Discharge Plan: Skilled Nursing Facility Barriers to Discharge: Awaiting State Approval (PASRR), Insurance Authorization, Continued Medical Work up, SNF Pending bed offer  Expected Discharge Plan and Services In-house Referral: Clinical Social Work     Living arrangements for the past 2 months: Single Family Home                                       Social Determinants of Health (SDOH) Interventions SDOH Screenings   Food Insecurity: No Food Insecurity (11/12/2018)  Transportation Needs: No Transportation Needs (11/12/2018)  Depression (PHQ2-9): Low Risk  (11/18/2018)  Financial Resource Strain: Low Risk  (11/12/2018)  Physical Activity: Inactive (11/12/2018)  Stress: Stress Concern Present (11/12/2018)  Tobacco Use: Low Risk  (04/20/2023)    Readmission Risk Interventions     No data to display

## 2023-04-20 NOTE — Assessment & Plan Note (Signed)
Echocardiogram with preserved LV systolic function with EF 60 to 65%, moderate LVH, RV systolic function preserved, RVSP 41.4 mmHg. Sp TAVR, LA with moderate dilatation, RA normal size, no pericardial effusion, no significant valvular disease.   Continue with SGLT 2 inh and metoprolol.  Torsemide on hold due to signs of low volume.

## 2023-04-20 NOTE — Progress Notes (Addendum)
Progress Note   Patient: Zoe [name]:[id number] DOB: [birth year] DOA: 04/15/2023     4 DOS: the patient was seen and examined on 04/20/2023   Brief hospital course: [name] was admitted to the hospital with the working diagnosis of atrial fibrillation with rapid ventricular response.   89 yo female with the past medical history of atrial fibrillation, aortic stenosis sp TAVR, hypothyroid, hypertension, multiple myeloma who presented with palpitations. Reported dyspnea for 2 days. She had acute onset of palpitations, her blood pressure was 90/50 and HR 155, EMS was called and it was attempted direct current cardioversion 200 J, with converting to sinus rhythm. Her lungs were clear to auscultation, with no wheezing or rales, heart with S1 and S2 present and rhythmic, with no gallops, positive murmur at the right lower sternal border, abdomen with no distention and no lower extremity edema.   Na 133, K 3.5 Cl 93, bicarbonate 25 glucose 109, bun 33 cr 1,0 High sensitive troponin 263, 255, 120  Wbc 9,8 hgb 13.4 plt 184   Urine analysis SG 1,023, protein negative, > 500 glucose, negative leukocytes and negative Hgb.  Urine osmolality 694   Chest radiograph with cardiomegaly, no infiltrates or effusions, right base atelectasis.   EKG 86 bpm, normal axis, normal intervals, sinus rhythm with right atrial enlargement, no significant ST segment or T wave changes. Positive LVH.   Hip radiographs with no acute fractures.   06/25 last night patient converted back to atrial fibrillation with rapid ventricular response. She has been placed on IV diltiazem and IV amiodarone.  She had confusion this am, required bilateral mittens.  06/26 mentation has improved, unsuccessful direct current cardioversion. 06/27 continue atrial fibrillation rhythm with improvement in rate control.  06/28 patient had direct current cardioversion, with brief sinus rhythm, but then back in atrial fibrillation with  RVR.   Assessment and Plan: * Paroxysmal atrial fibrillation (HCC) Today had second inpatient direct current cardioversion, unfortunately is back in atrial fibrillation with RVR.   Plan to continue with metoprolol 50 mg bid and amiodarone 200 mg daily.  She had one dose of digoxin yesterday. Now off diltiazem.     Anticoagulation with apixaban.  Out of bed to chair tid with meals Pt and Ot.   Elevated cardiac markers elevated, high sensitive troponin, possible due to RVR.  Old records personally reviewed 2019 coronary angiography with normal coronaries.   Continue telemetry monitoring.  To consider increasing oral amiodarone load, will follow up with Cardiology recommendations.   Acute on chronic diastolic heart failure (HCC) Echocardiogram with preserved LV systolic function with EF 60 to 65%, moderate LVH, RV systolic function preserved, RVSP 41.4 mmHg. Sp TAVR, LA with moderate dilatation, RA normal size, no pericardial effusion, no significant valvular disease.   Continue with SGLT 2 inh and metoprolol.   Diuresis with torsemide 20 mg po daily.  Hyponatremia Hypokalemia. Hypomagnesemia   Today serum cr is 1,0 with K at 3,6 and serum bicarbonate at 26.  Na 132 and Mg 1,8   Continue K and Mg supplementation ( 40 meq Kc and 2 g Mag sulfate). Keep Mag at 2 and K at 4,0 Follow up renal function in am.   Hypothyroidism Continue levothyroxine.  Hypertension Continue with metoprolol 50 mg po bid.   Hyperlipidemia Continue with statin therapy  History of stroke Resumed anticoagulation with apixaban.   Mild cognitive impairment. Intermittent confusion,  No clear encephalopathy. Patient at high risk of delirium.  Plan to continue close  neuro checks, promote adequate sleep and nutrition.   Multiple myeloma not having achieved remission (HCC) Follow up as outpatient.         Subjective: Patient with no chest pain or dyspnea, no palpitations. Continue very weak and  deconditioned   Physical Exam: Vitals:   04/20/23 0825 04/20/23 0835 04/20/23 1130 04/20/23 1343  BP: 127/62 110/60 (!) 144/56 (!) 145/79  Pulse: (!) 54 60  (!) 106  Resp: 16 (!) 22 20 20   Temp:    98.3 F (36.8 C)  TempSrc:    Oral  SpO2: 98% 95%  98%  Weight:      Height:       Neurology awake and alert ENT with mild pallor Cardiovascular with S1 and S2 present, irregularly irregular with no gallops, rubs or murmurs No JVD No lower extremity edema Respiratory with no rales or wheezing, no rhonchi Abdomen with no distention  Data Reviewed:    Family Communication: no family at the bedside   Disposition: Status is: Inpatient Remains inpatient appropriate because: uncontrolled atrial fibrillation   Planned Discharge Destination: Skilled nursing facility     Author: Coralie Keens, MD 04/20/2023 2:28 PM  For on call review [email or web address].

## 2023-04-20 NOTE — Progress Notes (Signed)
Heart Failure Navigator Progress Note  Assessed for Heart & Vascular TOC clinic readiness.  Patient does not meet criteria due to [facility] .   Navigator will sign off at this time.    Arael Piccione, BSN, RN Heart Failure Nurse Navigator Secure Chat Only   

## 2023-04-20 NOTE — Anesthesia Preprocedure Evaluation (Signed)
Anesthesia Evaluation  Patient identified by MRN, date of birth, ID band Patient awake    Reviewed: Allergy & Precautions, NPO status , Patient's Chart, lab work & pertinent test results  History of Anesthesia Complications Negative for: history of anesthetic complications  Airway Mallampati: III  TM Distance: >3 FB Neck ROM: Full    Dental  (+) Teeth Intact, Dental Advisory Given   Pulmonary neg pulmonary ROS   breath sounds clear to auscultation       Cardiovascular hypertension, Pt. on medications and Pt. on home beta blockers +CHF  + Valvular Problems/Murmurs  Rhythm:Irregular  1. Left ventricular ejection fraction, by estimation, is 60 to 65%. The  left ventricle has normal function. The left ventricle has no regional  wall motion abnormalities. There is moderate concentric left ventricular  hypertrophy. Left ventricular  diastolic parameters are consistent with Grade II diastolic dysfunction  (pseudonormalization). Elevated left ventricular end-diastolic pressure.   2. Right ventricular systolic function is normal. The right ventricular  size is normal. There is mildly elevated pulmonary artery systolic  pressure. The estimated right ventricular systolic pressure is 41.4 mmHg.   3. Left atrial size was moderately dilated.   4. The mitral valve is normal in structure. Mild mitral valve  regurgitation.   5. The aortic valve has been repaired/replaced. Aortic valve  regurgitation is mild. No aortic stenosis is present. There is a 23 mm 3  THV size 23 mm Sapien prosthetic (TAVR) valve present in the aortic  position. Procedure Date: 09/03/2018.   6. The inferior vena cava is normal in size with <50% respiratory  variability, suggesting right atrial pressure of 8 mmHg.     Neuro/Psych  Neuromuscular disease CVA    GI/Hepatic Neg liver ROS,GERD  ,,  Endo/Other  Hypothyroidism    Renal/GU Lab Results      Component                 Value               Date                      CREATININE               0.92                04/18/2023                Musculoskeletal  (+) Arthritis ,    Abdominal   Peds  Hematology  (+) Blood dyscrasia Lab Results      Component                Value               Date                      WBC                      11.1 (H)            04/18/2023                HGB                      13.4                04/18/2023  HCT                      40.9                04/18/2023                MCV                      86.5                04/18/2023                PLT                      141 (L)             04/18/2023              eliquis   Anesthesia Other Findings   Reproductive/Obstetrics                              Anesthesia Physical Anesthesia Plan  ASA: 2  Anesthesia Plan: General   Post-op Pain Management: Minimal or no pain anticipated   Induction: Intravenous  PONV Risk Score and Plan: 3 and Treatment may vary due to age or medical condition  Airway Management Planned: Nasal Cannula, Natural Airway, Simple Face Mask and Mask  Additional Equipment: None  Intra-op Plan:   Post-operative Plan:   Informed Consent: I have reviewed the patients History and Physical, chart, labs and discussed the procedure including the risks, benefits and alternatives for the proposed anesthesia with the patient or authorized representative who has indicated his/her understanding and acceptance.     Dental advisory given  Plan Discussed with: CRNA  Anesthesia Plan Comments:          Anesthesia Quick Evaluation

## 2023-04-21 DIAGNOSIS — E871 Hypo-osmolality and hyponatremia: Secondary | ICD-10-CM | POA: Diagnosis not present

## 2023-04-21 DIAGNOSIS — E039 Hypothyroidism, unspecified: Secondary | ICD-10-CM | POA: Diagnosis not present

## 2023-04-21 DIAGNOSIS — I480 Paroxysmal atrial fibrillation: Secondary | ICD-10-CM | POA: Diagnosis not present

## 2023-04-21 DIAGNOSIS — I5033 Acute on chronic diastolic (congestive) heart failure: Secondary | ICD-10-CM | POA: Diagnosis not present

## 2023-04-21 LAB — BASIC METABOLIC PANEL
Anion gap: 10 (ref 5–15)
BUN: 22 mg/dL (ref 8–23)
CO2: 26 mmol/L (ref 22–32)
Calcium: 7.9 mg/dL — ABNORMAL LOW (ref 8.9–10.3)
Chloride: 97 mmol/L — ABNORMAL LOW (ref 98–111)
Creatinine, Ser: 0.88 mg/dL (ref 0.44–1.00)
GFR, Estimated: >60 mL/min (ref >60)
Glucose, Bld: 98 mg/dL (ref 70–99)
Potassium: 3.8 mmol/L (ref 3.5–5.1)
Sodium: 133 mmol/L — ABNORMAL LOW (ref 135–145)

## 2023-04-21 LAB — MAGNESIUM: Magnesium: 2.2 mg/dL (ref 1.7–2.4)

## 2023-04-21 MED ORDER — POTASSIUM CHLORIDE CRYS ER 20 MEQ PO TBCR
40.0000 meq | EXTENDED_RELEASE_TABLET | Freq: Once | ORAL | Status: AC
  Administered 2023-04-21: 40 meq via ORAL
  Filled 2023-04-21: qty 2

## 2023-04-21 MED ORDER — DILTIAZEM HCL ER COATED BEADS 120 MG PO CP24
120.0000 mg | ORAL_CAPSULE | Freq: Every day | ORAL | Status: DC
  Administered 2023-04-21 – 2023-04-24 (×4): 120 mg via ORAL
  Filled 2023-04-21 (×4): qty 1

## 2023-04-21 MED ORDER — BUSPIRONE HCL 5 MG PO TABS
5.0000 mg | ORAL_TABLET | Freq: Two times a day (BID) | ORAL | Status: DC
  Administered 2023-04-21 – 2023-04-24 (×7): 5 mg via ORAL
  Filled 2023-04-21 (×7): qty 1

## 2023-04-21 MED ORDER — DIGOXIN 125 MCG PO TABS
0.0625 mg | ORAL_TABLET | Freq: Every day | ORAL | Status: DC
  Administered 2023-04-21 – 2023-04-24 (×4): 0.0625 mg via ORAL
  Filled 2023-04-21 (×4): qty 1

## 2023-04-21 NOTE — Progress Notes (Addendum)
Subjective:  Patient feels better.  Dyspnea has improved.  Wants to be discharged to rehab, wants to start walking in the hallway.  She has been confined to bed and would like to start walking so she can be ready for rehab.  Intake/Output from previous day:  I/O last 3 completed shifts: In: 827.6 [P.O.:480; I.V.:347.6] Out: 0  No intake/output data recorded. Net IO Since Admission: 4,081.62 mL [04/21/23 0720]  Blood pressure 122/81, pulse 97, temperature 97.8 F (36.6 C), temperature source Oral, resp. rate 18, height 5\' 3"  (1.6 m), weight 68.9 kg, SpO2 90 %. Physical Exam Neck:     Vascular: No carotid bruit or JVD.  Cardiovascular:     Rate and Rhythm: Regular rhythm. Tachycardia present.     Pulses: Normal pulses and intact distal pulses.     Heart sounds: No murmur heard. Pulmonary:     Effort: Pulmonary effort is normal.     Breath sounds: Examination of the right-lower field reveals rales. Examination of the left-lower field reveals rales. Rales present.  Abdominal:     General: Bowel sounds are normal.     Palpations: Abdomen is soft.  Musculoskeletal:     Right lower leg: No edema.     Left lower leg: No edema.  Skin:    Capillary Refill: Capillary refill takes less than 2 seconds.    Lab Results: Lab Results  Component Value Date   NA 133 (L) 04/21/2023   K 3.8 04/21/2023   CO2 26 04/21/2023   GLUCOSE 98 04/21/2023   BUN 22 04/21/2023   CREATININE 0.88 04/21/2023   CALCIUM 7.9 (L) 04/21/2023   EGFR 59 (L) 06/19/2022   GFRNONAA >60 04/21/2023    BNP (last 3 results) Recent Labs    06/19/22 1012 02/17/23 0922 04/13/23 0950  BNP 176.8* 454.3* 266.5*        Latest Ref Rng & Units 04/21/2023    1:10 AM 04/20/2023    1:21 AM 04/19/2023    1:56 AM  BMP  Glucose 70 - 99 mg/dL 98  161  096   BUN 8 - 23 mg/dL 22  26  27    Creatinine 0.44 - 1.00 mg/dL 0.45  4.09  8.11   Sodium 135 - 145 mmol/L 133  132  131   Potassium 3.5 - 5.1 mmol/L 3.8  3.6  3.0    Chloride 98 - 111 mmol/L 97  97  95   CO2 22 - 32 mmol/L 26  26  26    Calcium 8.9 - 10.3 mg/dL 7.9  7.8  7.6       Latest Ref Rng & Units 04/16/2023    4:00 AM 04/15/2023    9:45 PM 04/13/2023    9:07 AM  Hepatic Function  Total Protein 6.5 - 8.1 g/dL 4.6  4.9  5.4   Albumin 3.5 - 5.0 g/dL 2.3  2.4  3.2   AST 15 - 41 U/L 15  17  11    ALT 0 - 44 U/L 18  19  14    Alk Phosphatase 38 - 126 U/L 64  69  78   Total Bilirubin 0.3 - 1.2 mg/dL 1.1  0.8  1.1   Bilirubin, Direct 0.0 - 0.2 mg/dL  0.2        Latest Ref Rng & Units 04/20/2023    1:21 AM 04/19/2023    1:56 AM 04/18/2023    1:31 AM  CBC  WBC 4.0 - 10.5 K/uL 6.4  8.4  11.1   Hemoglobin 12.0 - 15.0 g/dL 11.9  14.7  82.9   Hematocrit 36.0 - 46.0 % 38.1  37.0  40.9   Platelets 150 - 400 K/uL 158  176  141    Lipid Panel     Component Value Date/Time   CHOL 174 03/30/2023 1415   CHOL 103 07/16/2019 0941   TRIG 76 03/30/2023 1415   HDL 59 03/30/2023 1415   HDL 48 07/16/2019 0941   CHOLHDL 2.9 03/30/2023 1415   VLDL 15 03/30/2023 1415   LDLCALC 100 (H) 03/30/2023 1415   LDLCALC 39 07/16/2019 0941   LDLDIRECT 48 07/16/2019 0941    HEMOGLOBIN A1C Lab Results  Component Value Date   HGBA1C 5.2 03/30/2023   MPG 102.54 03/30/2023   TSH Lab Results  Component Value Date   TSH 0.612 04/15/2023    Recent Labs    02/17/23 0922 04/15/23 2145  TSH 1.499 0.612    Radiology:     Cardiac Studies:    Echocardiogram 04/16/2023  1. Left ventricular ejection fraction, by estimation, is 60 to 65%. The left ventricle has normal function. The left ventricle has no regional wall motion abnormalities. There is moderate concentric left ventricular hypertrophy. Left ventricular  diastolic parameters are consistent with Grade II diastolic dysfunction (pseudonormalization). Elevated left ventricular end-diastolic pressure.  2. Right ventricular systolic function is normal. The right ventricular size is normal. There is mildly  elevated pulmonary artery systolic pressure. The estimated right ventricular systolic pressure is 41.4 mmHg.  3. Left atrial size was moderately dilated.  4. The mitral valve is normal in structure. Mild mitral valve regurgitation.  5. The aortic valve has been repaired/replaced. Aortic valve regurgitation is mild. No aortic stenosis is present. There is a 23 mm 3 THV size 23 mm Sapien prosthetic (TAVR) valve present in the aortic position. Procedure Date: 09/03/2018.  6. The inferior vena cava is normal in size with <50% respiratory variability, suggesting right atrial pressure of 8 mmHg.   Comparison(s): A prior study was performed on 02/17/2023. No significant change from prior study.   EKG:   EKG 04/16/2023: Normal sinus rhythm at the rate of 75 bpm, left atrial enlargement, otherwise normal EKG.   EKG 04/15/2023 at 1825: Normal sinus rhythm at the rate of 86 bpm, left atrial enlargement, otherwise normal EKG. EMS EKG 04/15/2023 at 1728 hrs.: Atrial fibrillation with rapid ventricular response, ventricular rate around 135 250 bpm.  Nonspecific ST-T changes.  Telemetry 04/19/2023: Atrial flutter with 2: 1 conduction at the rate of 135 bpm.   Allergies  Allergen Reactions   Penicillins Other (See Comments)    UNSPECIFIED REACTION  Patient does not remember reaction.  Has patient had a PCN reaction causing immediate rash, facial/tongue/throat swelling, SOB or lightheadedness with hypotension: no Has patient had a PCN reaction causing severe rash involving mucus membranes or skin necrosis: no Has patient had a PCN reaction that required hospitalization no Has patient had a PCN reaction occurring within the last 10 years: no If all of the above answers are "NO", then may proceed with Cephalosporin use.    Sulfa Antibiotics Other (See Comments)    UNSPECIFIED REACTION  "maybe vision issues? "    Scheduled Meds:  amiodarone  200 mg Oral Daily   apixaban  5 mg Oral BID   busPIRone  5 mg  Oral BID   dapagliflozin propanediol  10 mg Oral Daily   diltiazem  120 mg Oral Daily   feeding supplement  237 mL Oral BID BM   levothyroxine  100 mcg Oral QAC breakfast   loratadine  10 mg Oral Daily   metoprolol tartrate  50 mg Oral BID   pantoprazole  40 mg Oral Daily   pravastatin  40 mg Oral QPM   senna-docusate  1 tablet Oral Daily   sodium chloride flush  3 mL Intravenous Q12H   sucralfate  1 g Oral TID WC & HS   torsemide  20 mg Oral Daily   Continuous Infusions:   PRN Meds:.acetaminophen **OR** acetaminophen, HYDROcodone-acetaminophen, ondansetron **OR** ondansetron (ZOFRAN) IV  Assessment  Zoe [name] is a 89 y.o. female patient with history of possibly mild coronary spasm by coronary angiogram in 2014,  severe AS s/p TAVR by Dr. Cornelius Moras on 09/03/2018, asymptomatic bilateral carotid artery stenosis,  essential hypertension, hyperlipidemia, hyperglycemia and GERD.  Patient also going through chemotherapy, presently diagnosed with multiple myeloma.    Now admitted with chest pain, A-fib with RVR.  Has had beaters of confusion, sundowning.  1.  Paroxysmal atrial fibrillation with rapid ventricular response, has failed cardioversion x 2 on 6/27 and 04/20/2023 briefly being in sinus. 2.  Chest pain on breathing, clearly musculoskeletal, now improved  3.  Aortic valve replacement, aortic valve functioning normally. 4.  Acute on chronic diastolic heart failure, now resolved 5.  Anxiety and depression 6.  Bilateral basal lung atelectasis  Plan:   Continue digoxin and amiodarone 0.125 mg daily and 200 mg p.o. daily along with metoprolol 50 mg p.o. twice daily.  I have added diltiazem CD1 20 mg daily.  BuSpar 5 mg twice daily for anxiety and depression.  Incentive spirometer.  Discontinue bedrest, ambulate ad hoc and ad lib., not worry about the heart rate as she is completely asymptomatic and heart failure symptoms have resolved.     Yates Decamp, MD, [facility] 04/21/2023,  7:20 AM Office: [phone] Fax: [phone] Pager: [phone]

## 2023-04-21 NOTE — Progress Notes (Signed)
Mobility Specialist: Progress Note   04/21/23 1149  Mobility  Activity Ambulated with assistance in hallway  Level of Assistance Minimal assist, patient does 75% or more  Assistive Device Front wheel walker  Distance Ambulated (ft) 70 ft (40'+30')  Activity Response Tolerated well  Mobility Referral Yes  $Mobility charge 1 Mobility  Mobility Specialist Start Time (ACUTE ONLY) 1110  Mobility Specialist Stop Time (ACUTE ONLY) 1147  Mobility Specialist Time Calculation (min) (ACUTE ONLY) 37 min   Pre-Mobility: 121 HR During Mobility: 148 HR Post-Mobility: 100 HR  Pt received in the bed and agreeable to mobility. MinA with bed mobility and contact guard during ambulation. Stopped x1 for seated break secondary to general fatigue, otherwise asymptomatic. Pt back to bed at end of session with call bell and phone in reach.   Tyjanae Bartek Mobility Specialist Please contact via SecureChat or Rehab office at [phone]

## 2023-04-21 NOTE — Progress Notes (Signed)
Ok to put the digoxin order back per Dr. Jacinto Halim. Will adjust dose to 0.0625mg  qday due to crcl and interaction with amio.  Ulyses Southward, PharmD, BCIDP, AAHIVP, CPP Infectious Disease Pharmacist 04/21/2023 10:27 AM

## 2023-04-21 NOTE — Discharge Instructions (Signed)
Information on my medicine - ELIQUIS (apixaban)  This medication education was reviewed with me or my healthcare representative as part of my discharge preparation.  The pharmacist that spoke with me during my hospital stay was:    Why was Eliquis prescribed for you? Eliquis was prescribed for you to reduce the risk of a blood clot forming that can cause a stroke if you have a medical condition called atrial fibrillation (a type of irregular heartbeat).  What do You need to know about Eliquis ? Take your Eliquis TWICE DAILY - one tablet in the morning and one tablet in the evening with or without food. If you have difficulty swallowing the tablet whole please discuss with your pharmacist how to take the medication safely.  Take Eliquis exactly as prescribed by your doctor and DO NOT stop taking Eliquis without talking to the doctor who prescribed the medication.  Stopping may increase your risk of developing a stroke.  Refill your prescription before you run out.  After discharge, you should have regular check-up appointments with your healthcare provider that is prescribing your Eliquis.  In the future your dose may need to be changed if your kidney function or weight changes by a significant amount or as you get older.  What do you do if you miss a dose? If you miss a dose, take it as soon as you remember on the same day and resume taking twice daily.  Do not take more than one dose of ELIQUIS at the same time to make up a missed dose.  Important Safety Information A possible side effect of Eliquis is bleeding. You should call your healthcare provider right away if you experience any of the following: Bleeding from an injury or your nose that does not stop. Unusual colored urine (red or dark brown) or unusual colored stools (red or black). Unusual bruising for unknown reasons. A serious fall or if you hit your head (even if there is no bleeding).  Some medicines may interact with  Eliquis and might increase your risk of bleeding or clotting while on Eliquis. To help avoid this, consult your healthcare provider or pharmacist prior to using any new prescription or non-prescription medications, including herbals, vitamins, non-steroidal anti-inflammatory drugs (NSAIDs) and supplements.  This website has more information on Eliquis (apixaban): http://www.eliquis.com/eliquis/home  ============================================  Atrial Fibrillation    Atrial fibrillation is a type of heartbeat that is irregular or fast. If you have this condition, your heart beats without any order. This makes it hard for your heart to pump blood in a normal way. Atrial fibrillation may come and go, or it may become a long-lasting problem. If this condition is not treated, it can put you at higher risk for stroke, heart failure, and other heart problems.  What are the causes? This condition may be caused by diseases that damage the heart. They include: High blood pressure. Heart failure. Heart valve disease. Heart surgery. Other causes include: Diabetes. Thyroid disease. Being overweight. Kidney disease. Sometimes the cause is not known.  What increases the risk? You are more likely to develop this condition if: You are older. You smoke. You exercise often and very hard. You have a family history of this condition. You are a man. You use drugs. You drink a lot of alcohol. You have lung conditions, such as emphysema, pneumonia, or COPD. You have sleep apnea.  What are the signs or symptoms? Common symptoms of this condition include: A feeling that your heart is beating  very fast. Chest pain or discomfort. Feeling short of breath. Suddenly feeling light-headed or weak. Getting tired easily during activity. Fainting. Sweating. In some cases, there are no symptoms.  How is this treated? Treatment for this condition depends on underlying conditions and how you feel when  you have atrial fibrillation. They include: Medicines to: Prevent blood clots. Treat heart rate or heart rhythm problems. Using devices, such as a pacemaker, to correct heart rhythm problems. Doing surgery to remove the part of the heart that sends bad signals. Closing an area where clots can form in the heart (left atrial appendage). In some cases, your doctor will treat other underlying conditions.  Follow these instructions at home:  Medicines Take over-the-counter and prescription medicines only as told by your doctor. Do not take any new medicines without first talking to your doctor. If you are taking blood thinners: Talk with your doctor before you take any medicines that have aspirin or NSAIDs, such as ibuprofen, in them. Take your medicine exactly as told by your doctor. Take it at the same time each day. Avoid activities that could hurt or bruise you. Follow instructions about how to prevent falls. Wear a bracelet that says you are taking blood thinners. Or, carry a card that lists what medicines you take. Lifestyle         Do not use any products that have nicotine or tobacco in them. These include cigarettes, e-cigarettes, and chewing tobacco. If you need help quitting, ask your doctor. Eat heart-healthy foods. Talk with your doctor about the right eating plan for you. Exercise regularly as told by your doctor. Do not drink alcohol. Lose weight if you are overweight. Do not use drugs, including cannabis.  General instructions If you have a condition that causes breathing to stop for a short period of time (apnea), treat it as told by your doctor. Keep a healthy weight. Do not use diet pills unless your doctor says they are safe for you. Diet pills may make heart problems worse. Keep all follow-up visits as told by your doctor. This is important.  Contact a doctor if: You notice a change in the speed, rhythm, or strength of your heartbeat. You are taking a  blood-thinning medicine and you get more bruising. You get tired more easily when you move or exercise. You have a sudden change in weight.  Get help right away if:    You have pain in your chest or your belly (abdomen). You have trouble breathing. You have side effects of blood thinners, such as blood in your vomit, poop (stool), or pee (urine), or bleeding that cannot stop. You have any signs of a stroke. "BE FAST" is an easy way to remember the main warning signs: B - Balance. Signs are dizziness, sudden trouble walking, or loss of balance. E - Eyes. Signs are trouble seeing or a change in how you see. F - Face. Signs are sudden weakness or loss of feeling in the face, or the face or eyelid drooping on one side. A - Arms. Signs are weakness or loss of feeling in an arm. This happens suddenly and usually on one side of the body. S - Speech. Signs are sudden trouble speaking, slurred speech, or trouble understanding what people say. T - Time. Time to call emergency services. Write down what time symptoms started. You have other signs of a stroke, such as: A sudden, very bad headache with no known cause. Feeling like you may vomit (nausea). Vomiting. A  seizure.  These symptoms may be an emergency. Do not wait to see if the symptoms will go away. Get medical help right away. Call your local emergency services (911 in the [place] Do not drive yourself to the hospital. Summary Atrial fibrillation is a type of heartbeat that is irregular or fast. You are at higher risk of this condition if you smoke, are older, have diabetes, or are overweight. Follow your doctor's instructions about medicines, diet, exercise, and follow-up visits. Get help right away if you have signs or symptoms of a stroke. Get help right away if you cannot catch your breath, or you have chest pain or discomfort. This information is not intended to replace advice given to you by your health care provider. Make sure you  discuss any questions you have with your health care provider. Document Revised: 04/02/2019 Document Reviewed: 04/02/2019 Elsevier Patient Education  2020 [employer].

## 2023-04-21 NOTE — Progress Notes (Signed)
Progress Note   Patient: Zoe [name] RUE:[id number] DOB: 26-Apr-1934 DOA: 04/15/2023     5 DOS: the patient was seen and examined on 04/21/2023   Brief hospital course: Zoe [name] was admitted to the hospital with the working diagnosis of atrial fibrillation with rapid ventricular response.   89 yo female with the past medical history of atrial fibrillation, aortic stenosis sp TAVR, hypothyroid, hypertension, multiple myeloma who presented with palpitations. Reported dyspnea for 2 days. She had acute onset of palpitations, her blood pressure was 90/50 and HR 155, EMS was called and it was attempted direct current cardioversion 200 J, with converting to sinus rhythm. Her lungs were clear to auscultation, with no wheezing or rales, heart with S1 and S2 present and rhythmic, with no gallops, positive murmur at the right lower sternal border, abdomen with no distention and no lower extremity edema.   Na 133, K 3.5 Cl 93, bicarbonate 25 glucose 109, bun 33 cr 1,0 High sensitive troponin 263, 255, 120  Wbc 9,8 hgb 13.4 plt 184   Urine analysis SG 1,023, protein negative, > 500 glucose, negative leukocytes and negative Hgb.  Urine osmolality 694   Chest radiograph with cardiomegaly, no infiltrates or effusions, right base atelectasis.   EKG 86 bpm, normal axis, normal intervals, sinus rhythm with right atrial enlargement, no significant ST segment or T wave changes. Positive LVH.   Hip radiographs with no acute fractures.   06/25 last night patient converted back to atrial fibrillation with rapid ventricular response. She has been placed on IV diltiazem and IV amiodarone.  She had confusion this am, required bilateral mittens.  06/26 mentation has improved, unsuccessful direct current cardioversion. 06/27 continue atrial fibrillation rhythm with improvement in rate control.  06/28 patient had direct current cardioversion, with brief sinus rhythm, but then back in atrial fibrillation with  RVR.  06/29 continue in atrial fibrillation, some improvement in hear rate.   Assessment and Plan: * Paroxysmal atrial fibrillation (HCC) Sp 2 inpatient cardioversion.  She continue in atrial fibrillation, but with improved heart rate, 100 to 120 bpm, she has been asymptomatic.   Plan to continue with digoxin, metoprolol 50 mg bid, diltiazem 120 mg and amiodarone 200 mg daily.   Anticoagulation with apixaban.  Out of bed to chair tid with meals Pt and Ot.   Elevated cardiac markers elevated, high sensitive troponin, possible due to RVR.  Old records personally reviewed 2019 coronary angiography with normal coronaries.   Continue telemetry monitoring.  To consider increasing oral amiodarone load, will follow up with Cardiology recommendations.   Acute on chronic diastolic heart failure (HCC) Echocardiogram with preserved LV systolic function with EF 60 to 65%, moderate LVH, RV systolic function preserved, RVSP 41.4 mmHg. Sp TAVR, LA with moderate dilatation, RA normal size, no pericardial effusion, no significant valvular disease.   Continue with SGLT 2 inh and metoprolol.   Diuresis with torsemide 20 mg po daily.  Hyponatremia Hypokalemia. Hypomagnesemia   Renal function with serum cr at 0,8 with K at 3,8 and serum bicarbonate at 26.  Na 133. Mg 2.2   Add 40 meq Kcl today and follow up renal function and electrolytes in am.  Keep Mag at 2 and K at 4,0 Follow up renal function in am.   Hypothyroidism Continue levothyroxine.  Hypertension Continue with metoprolol 50 mg po bid.   Hyperlipidemia Continue with statin therapy  History of stroke Resumed anticoagulation with apixaban.   Mild cognitive impairment. Intermittent confusion,  No clear  encephalopathy. Patient at high risk of delirium.  Plan to continue close neuro checks, promote adequate sleep and nutrition.  Mentation is back to baseline.   Multiple myeloma not having achieved remission (HCC) Follow up as  outpatient.         Subjective: Patient is feeling better, continue very weak and deconditioned.   Physical Exam: Vitals:   04/20/23 2353 04/21/23 0342 04/21/23 0749 04/21/23 1311  BP: (!) 138/52 122/81 (!) 120/102 (!) 112/99  Pulse: 73 97 96 80  Resp: 18 18    Temp: 98.4 F (36.9 C) 97.8 F (36.6 C) 98.1 F (36.7 C) 98.1 F (36.7 C)  TempSrc: Oral Oral Oral Oral  SpO2: 95% 90% 92% 96%  Weight:      Height:       Neurology awake and alert ENT with mild pallor Cardiovascular with S1 and S2 present, irregularly irregular with no gallops, rubs or murmurs Respiratory with no rales or wheezing or rhonchi Abdomen with no distention  No lower extremity edema  Data Reviewed:    Family Communication: no family at the bedside   Disposition: Status is: Inpatient Remains inpatient appropriate because: pending placement SNF  Planned Discharge Destination: Skilled nursing facility     Author: Coralie Keens, MD 04/21/2023 3:46 PM  For on call review [email or web address].

## 2023-04-22 DIAGNOSIS — E039 Hypothyroidism, unspecified: Secondary | ICD-10-CM | POA: Diagnosis not present

## 2023-04-22 DIAGNOSIS — E871 Hypo-osmolality and hyponatremia: Secondary | ICD-10-CM | POA: Diagnosis not present

## 2023-04-22 DIAGNOSIS — I5033 Acute on chronic diastolic (congestive) heart failure: Secondary | ICD-10-CM | POA: Diagnosis not present

## 2023-04-22 DIAGNOSIS — I480 Paroxysmal atrial fibrillation: Secondary | ICD-10-CM | POA: Diagnosis not present

## 2023-04-22 LAB — BASIC METABOLIC PANEL
Anion gap: 17 — ABNORMAL HIGH (ref 5–15)
BUN: 28 mg/dL — ABNORMAL HIGH (ref 8–23)
CO2: 25 mmol/L (ref 22–32)
Calcium: 8.4 mg/dL — ABNORMAL LOW (ref 8.9–10.3)
Chloride: 94 mmol/L — ABNORMAL LOW (ref 98–111)
Creatinine, Ser: 1.2 mg/dL — ABNORMAL HIGH (ref 0.44–1.00)
GFR, Estimated: 44 mL/min — ABNORMAL LOW (ref >60)
Glucose, Bld: 113 mg/dL — ABNORMAL HIGH (ref 70–99)
Potassium: 3.9 mmol/L (ref 3.5–5.1)
Sodium: 136 mmol/L (ref 135–145)

## 2023-04-22 NOTE — Progress Notes (Signed)
Progress Note   Patient: Zoe [name] YNW:[id number] DOB: [birth year] DOA: 04/15/2023     6 DOS: the patient was seen and examined on 04/22/2023   Brief hospital course: Zoe [name] was admitted to the hospital with the working diagnosis of atrial fibrillation with rapid ventricular response.   89 yo female with the past medical history of atrial fibrillation, aortic stenosis sp TAVR, hypothyroid, hypertension, multiple myeloma who presented with palpitations. Reported dyspnea for 2 days. She had acute onset of palpitations, her blood pressure was 90/50 and HR 155, EMS was called and it was attempted direct current cardioversion 200 J, with converting to sinus rhythm. Her lungs were clear to auscultation, with no wheezing or rales, heart with S1 and S2 present and rhythmic, with no gallops, positive murmur at the right lower sternal border, abdomen with no distention and no lower extremity edema.   Na 133, K 3.5 Cl 93, bicarbonate 25 glucose 109, bun 33 cr 1,0 High sensitive troponin 263, 255, 120  Wbc 9,8 hgb 13.4 plt 184   Urine analysis SG 1,023, protein negative, > 500 glucose, negative leukocytes and negative Hgb.  Urine osmolality 694   Chest radiograph with cardiomegaly, no infiltrates or effusions, right base atelectasis.   EKG 86 bpm, normal axis, normal intervals, sinus rhythm with right atrial enlargement, no significant ST segment or T wave changes. Positive LVH.   Hip radiographs with no acute fractures.   06/25 last night patient converted back to atrial fibrillation with rapid ventricular response. She has been placed on IV diltiazem and IV amiodarone.  She had confusion this am, required bilateral mittens.  06/26 mentation has improved, unsuccessful direct current cardioversion. 06/27 continue atrial fibrillation rhythm with improvement in rate control.  06/28 patient had direct current cardioversion, with brief sinus rhythm, but then back in atrial fibrillation with  RVR.  06/29 continue in atrial fibrillation, some improvement in hear rate.  06/30 improved heart rate 100's. Pending transfer to SNF.   Assessment and Plan: * Paroxysmal atrial fibrillation (HCC) Sp 2 inpatient cardioversion.  Telemetry with atrial fibrillation, rate around 100's bpm.   Plan to continue with digoxin, metoprolol 50 mg bid, diltiazem 120 mg and amiodarone 200 mg daily.   Anticoagulation with apixaban.  Out of bed to chair tid with meals Pt and Ot.   Elevated cardiac markers elevated, high sensitive troponin, possible due to RVR.  Old records personally reviewed 2019 coronary angiography with normal coronaries.   Continue telemetry monitoring.   Acute on chronic diastolic heart failure (HCC) Echocardiogram with preserved LV systolic function with EF 60 to 65%, moderate LVH, RV systolic function preserved, RVSP 41.4 mmHg. Sp TAVR, LA with moderate dilatation, RA normal size, no pericardial effusion, no significant valvular disease.   Continue with SGLT 2 inh and metoprolol.   Diuresis with torsemide 20 mg po daily.  Hyponatremia AKI, Hypokalemia. Hypomagnesemia   Renal function today with serum cr at 1,20 with K at 3.9 and serum bicarbonate at 25. Na 136/   Plan to hold on torsemide for now, due to worsening renal function.  Follow up renal panel in am.  Hypothyroidism Continue levothyroxine.  Hypertension Continue with metoprolol 50 mg po bid.   Hyperlipidemia Continue with statin therapy  History of stroke Resumed anticoagulation with apixaban.   Mild cognitive impairment. Intermittent confusion,  No clear encephalopathy. Patient at high risk of delirium.  Plan to continue close neuro checks, promote adequate sleep and nutrition.  Mentation is back to baseline.  Multiple myeloma not having achieved remission (HCC) Follow up as outpatient.         Subjective: Patient is feeling better, no palpitations, dyspnea, PND or orthopnea.  Continue  very weak and deconditioned   Physical Exam: Vitals:   04/21/23 1835 04/21/23 2014 04/21/23 2340 04/22/23 0918  BP: 133/88 (!) 133/104 (!) 144/113 116/69  Pulse: 98  (!) 105   Resp: 20 18 20    Temp: 98 F (36.7 C) 97.9 F (36.6 C) 98.4 F (36.9 C) 97.7 F (36.5 C)  TempSrc: Oral  Oral Oral  SpO2: 94% 95% 95%   Weight:      Height:       Neurology awake and alert ENT with mild pallor Cardiovascular with S1 and S2 present, irregularly irregular with no gallops, rubs or murmurs No JVD No lower extremity edema Respiratory with no rales or wheezing, no rhonchi Abdomen with no distention  Data Reviewed:    Family Communication: no family at the bedside   Disposition: Status is: Inpatient Remains inpatient appropriate because: pending transfer to SNF  Planned Discharge Destination: Skilled nursing facility     Author: Coralie Keens, MD 04/22/2023 9:41 AM  For on call review [email or web address].

## 2023-04-22 NOTE — Progress Notes (Signed)
Mobility Specialist Progress Note:   04/22/23 1534  Mobility  Activity Ambulated with assistance in hallway  Level of Assistance Minimal assist, patient does 75% or more  Assistive Device Front wheel walker  Distance Ambulated (ft) 100 ft  Activity Response Tolerated well  Mobility Referral Yes  $Mobility charge 1 Mobility  Mobility Specialist Start Time (ACUTE ONLY) 1428  Mobility Specialist Stop Time (ACUTE ONLY) 1455  Mobility Specialist Time Calculation (min) (ACUTE ONLY) 27 min   Pt received in bed, agreeable to ambulate. Pt needed MinA with bed mobility and STS. During ambulation pt c/o L leg pain, rating it an 8/10. Prior to assisting to chair pt requested to use the BR, void successful. Pt situated in chair w/ call bell and personal belonging at hand. All needs met.  Thompson Grayer Mobility Specialist  Please contact vis Secure Chat or  Rehab Office [phone]

## 2023-04-23 ENCOUNTER — Encounter (HOSPITAL_COMMUNITY): Payer: Self-pay | Admitting: Cardiology

## 2023-04-23 DIAGNOSIS — I4891 Unspecified atrial fibrillation: Secondary | ICD-10-CM | POA: Diagnosis not present

## 2023-04-23 DIAGNOSIS — I480 Paroxysmal atrial fibrillation: Secondary | ICD-10-CM | POA: Diagnosis not present

## 2023-04-23 DIAGNOSIS — I5033 Acute on chronic diastolic (congestive) heart failure: Secondary | ICD-10-CM | POA: Diagnosis not present

## 2023-04-23 DIAGNOSIS — E871 Hypo-osmolality and hyponatremia: Secondary | ICD-10-CM | POA: Diagnosis not present

## 2023-04-23 NOTE — Progress Notes (Signed)
Mobility Specialist Progress Note:   04/23/23 1719  Mobility  Activity Refused mobility    Pt received in bed. Refused mobility d/t recently ambulatign with PT.  Zoe [name] Mobility Specialist Please contact via Special educational needs teacher or Rehab office at [phone]

## 2023-04-23 NOTE — Progress Notes (Signed)
Physical Therapy Treatment Patient Details Name: Zoe [name] MRN: [id number] DOB: 19-Jan-1934 Today's Date: 04/23/2023   History of Present Illness 89 y.o. female admitted 04/15/23 with Afib with RVR; cardioversion attempted; reports sacral pain s/p recent fall in garden; reports a second recent fall due to Rt knee buckling. 6/26 cardioversion with return to Afib.  PMHx: A-fib on Eliquis and amiodarone, hypothyroidism, HTN, breast CA, multiple myeloma, aortic stenosis s/p TAVR, TIA    PT Comments  Pt received in supine and requesting to use the bathroom. Pt able to perform all mobility tasks with up to min A. Pt able to void and perform pericare in sitting without assist. Pt able to stand at the sink to perform hand hygiene, however requires UE support for balance. Pt able to tolerate increased gait distance this session with min guard for safety. Pt reporting RLE pain and weakness throughout the session, but reports decreased stiffness with ambulation.  Pt continues to benefit from PT services to progress toward functional mobility goals.     Assistance Recommended at Discharge Intermittent Supervision/Assistance  If plan is discharge home, recommend the following:  Can travel by private vehicle    A little help with walking and/or transfers;A little help with bathing/dressing/bathroom;Assistance with cooking/housework;Direct supervision/assist for medications management;Direct supervision/assist for financial management;Assist for transportation;Help with stairs or ramp for entrance   Yes  Equipment Recommendations  None recommended by PT    Recommendations for Other Services       Precautions / Restrictions Precautions Precautions: Fall Precaution Comments: watch HR Restrictions Weight Bearing Restrictions: No     Mobility  Bed Mobility Overal bed mobility: Needs Assistance Bed Mobility: Sit to Supine, Supine to Sit     Supine to sit: HOB elevated, Min assist Sit to  supine: Min assist, HOB elevated   General bed mobility comments: Light min A for trunk elevation via HHA and RLE elevation to EOB to return to supine.    Transfers Overall transfer level: Needs assistance Equipment used: Rolling walker (2 wheels) Transfers: Sit to/from Stand Sit to Stand: Min guard           General transfer comment: STS from EOB and toilet with min guard for safety and cues for hand placement.    Ambulation/Gait Ambulation/Gait assistance: Min guard Gait Distance (Feet): 80 Feet Assistive device: Rolling walker (2 wheels) Gait Pattern/deviations: Step-through pattern, Decreased stride length, Decreased stance time - right       General Gait Details: Pt demonstrating slow step-through pattern with slightly decreased RLE WB due to pain      Balance Overall balance assessment: Needs assistance Sitting-balance support: Feet supported Sitting balance-Leahy Scale: Good Sitting balance - Comments: sitting EOB   Standing balance support: Reliant on assistive device for balance, Bilateral upper extremity supported, During functional activity Standing balance-Leahy Scale: Fair Standing balance comment: with RW support. Pt requires UE support to stand at sink and wash hands                            Cognition Arousal/Alertness: Awake/alert Behavior During Therapy: WFL for tasks assessed/performed Overall Cognitive Status: No family/caregiver present to determine baseline cognitive functioning                                          Exercises      General Comments  Pertinent Vitals/Pain Pain Assessment Pain Assessment: Faces Faces Pain Scale: Hurts little more Pain Location: Right leg Pain Descriptors / Indicators: Tender, Sore Pain Intervention(s): Monitored during session, Repositioned     PT Goals (current goals can now be found in the care plan section) Acute Rehab PT Goals Patient Stated Goal: work with  PT to get stronger and improve balance PT Goal Formulation: With patient Time For Goal Achievement: 04/30/23 Potential to Achieve Goals: Good Progress towards PT goals: Progressing toward goals    Frequency    Min 1X/week      PT Plan Current plan remains appropriate       AM-PAC PT "6 Clicks" Mobility   Outcome Measure  Help needed turning from your back to your side while in a flat bed without using bedrails?: None Help needed moving from lying on your back to sitting on the side of a flat bed without using bedrails?: A Little Help needed moving to and from a bed to a chair (including a wheelchair)?: A Little Help needed standing up from a chair using your arms (e.g., wheelchair or bedside chair)?: A Little Help needed to walk in hospital room?: A Little Help needed climbing 3-5 steps with a railing? : A Lot 6 Click Score: 18    End of Session Equipment Utilized During Treatment: Gait belt Activity Tolerance: Patient tolerated treatment well Patient left: with call bell/phone within reach;in bed Nurse Communication: Mobility status PT Visit Diagnosis: History of falling (Z91.81);Difficulty in walking, not elsewhere classified (R26.2);Pain Pain - Right/Left: Right Pain - part of body: Leg     Time: 4098-1191 PT Time Calculation (min) (ACUTE ONLY): 21 min  Charges:    $Gait Training: 8-22 mins PT General Charges $$ ACUTE PT VISIT: 1 Visit                     Johny Shock, PTA Acute Rehabilitation Services Secure Chat Preferred  [phone]    Johny Shock 04/23/2023, 4:16 PM

## 2023-04-23 NOTE — Progress Notes (Signed)
Progress Note   Patient: Zoe [name] ZOX:[id number] DOB: 08-05-1934 DOA: 04/15/2023     7 DOS: the patient was seen and examined on 04/23/2023   Brief hospital course: Zoe [name] was admitted to the hospital with the working diagnosis of atrial fibrillation with rapid ventricular response.   89 yo female with the past medical history of atrial fibrillation, aortic stenosis sp TAVR, hypothyroid, hypertension, multiple myeloma who presented with palpitations. Reported dyspnea for 2 days. She had acute onset of palpitations, her blood pressure was 90/50 and HR 155, EMS was called and it was attempted direct current cardioversion 200 J, with converting to sinus rhythm. Her lungs were clear to auscultation, with no wheezing or rales, heart with S1 and S2 present and rhythmic, with no gallops, positive murmur at the right lower sternal border, abdomen with no distention and no lower extremity edema.   Na 133, K 3.5 Cl 93, bicarbonate 25 glucose 109, bun 33 cr 1,0 High sensitive troponin 263, 255, 120  Wbc 9,8 hgb 13.4 plt 184   Urine analysis SG 1,023, protein negative, > 500 glucose, negative leukocytes and negative Hgb.  Urine osmolality 694   Chest radiograph with cardiomegaly, no infiltrates or effusions, right base atelectasis.   EKG 86 bpm, normal axis, normal intervals, sinus rhythm with right atrial enlargement, no significant ST segment or T wave changes. Positive LVH.   Hip radiographs with no acute fractures.   06/25 last night patient converted back to atrial fibrillation with rapid ventricular response. She has been placed on IV diltiazem and IV amiodarone.  She had confusion this am, required bilateral mittens.  06/26 mentation has improved, unsuccessful direct current cardioversion. 06/27 continue atrial fibrillation rhythm with improvement in rate control.  06/28 patient had direct current cardioversion, with brief sinus rhythm, but then back in atrial fibrillation with  RVR.  06/29 continue in atrial fibrillation, some improvement in hear rate.  06/30 improved heart rate 100's. Pending transfer to SNF.  07/01 patient converted to sinus rhythm, with bradycardia.   Assessment and Plan: * Paroxysmal atrial fibrillation (HCC) Sp 2 inpatient cardioversion.  Patient converted today to sinus rhythm with rate in 57 to 69 range.   Plan to continue with digoxin, metoprolol 50 mg bid, diltiazem 120 mg and amiodarone 200 mg daily.  Continue telemetry monitoring.   Anticoagulation with apixaban.  Out of bed to chair tid with meals Pt and Ot.   Elevated cardiac markers elevated, high sensitive troponin, possible due to RVR.  Old records personally reviewed 2019 coronary angiography with normal coronaries.   Acute on chronic diastolic heart failure (HCC) Echocardiogram with preserved LV systolic function with EF 60 to 65%, moderate LVH, RV systolic function preserved, RVSP 41.4 mmHg. Sp TAVR, LA with moderate dilatation, RA normal size, no pericardial effusion, no significant valvular disease.   Continue with SGLT 2 inh and metoprolol.  Torsemide on hold due to signs of low volume.   Hyponatremia AKI, Hypokalemia. Hypomagnesemia   Continue to hold on torsemide today.  Follow up renal panel in am.  Hypothyroidism Continue levothyroxine.  Hypertension Continue with metoprolol 50 mg po bid.   Hyperlipidemia Continue with statin therapy  History of stroke Resumed anticoagulation with apixaban.   Mild cognitive impairment. Intermittent confusion,  No clear encephalopathy. Patient at high risk of delirium.  Plan to continue close neuro checks, promote adequate sleep and nutrition.  Mentation is back to baseline.   Multiple myeloma not having achieved remission (HCC) Follow up as  outpatient.         Subjective: Patient with no chest pain or dyspnea, no palpitations, no PND or orthopnea.   Physical Exam: Vitals:   04/23/23 0459 04/23/23 0822  04/23/23 0958 04/23/23 1100  BP: 134/62 (!) 145/51 (!) 132/58 (!) 137/58  Pulse: 61 64 61 62  Resp: 18     Temp: 97.9 F (36.6 C) 97.8 F (36.6 C)  97.7 F (36.5 C)  TempSrc: Oral Oral  Oral  SpO2: 94% 93%  94%  Weight:      Height:       Neurology awake and alert ENT with mild pallor Cardiovascular with S1 and S2 present and regular, bradycardic with no gallops, rubs or murmurs Respiratory with no rales or wheezing, no rhonchi Abdomen with no distention  No lower extremity edema  Data Reviewed:    Family Communication: no family at the bedside   Disposition: Status is: Inpatient Remains inpatient appropriate because: pending transfer to SNF  Planned Discharge Destination: Skilled nursing facility     Author: Coralie Keens, MD 04/23/2023 1:33 PM  For on call review [email or web address].

## 2023-04-23 NOTE — TOC Progression Note (Signed)
Transition of Care [facility]) - Progression Note    Patient Details  Name: Zoe [name] MRN: [id number] Date of Birth: [birth year]  [facility] CM/SW Contact  Eduard Roux, [place] Phone Number: 04/23/2023, 2:41 PM  Clinical Narrative:     1:16pm- [facility] confirmed availability-   2:40 pm- CSW sent secured chat to PT- will need updated PT note to start insurance authorization.   2:42pm- updated patient's daughter- [name] - will need auth before she can d/c to SNF.   Patient will need PTAR at discharge  [facility] will continue to follow and assist with discharge planning.  Antony Blackbird, MSW, LCSW Clinical Social Worker     Expected Discharge Plan: Skilled Nursing Facility Barriers to Discharge: Insurance Authorization  Expected Discharge Plan and Services In-house Referral: Clinical Social Work     Living arrangements for the past 2 months: Single Family Home                                       Social Determinants of Health (SDOH) Interventions SDOH Screenings   Food Insecurity: No Food Insecurity (04/22/2023)  Housing: Patient Declined (04/22/2023)  Transportation Needs: No Transportation Needs (04/22/2023)  Utilities: Patient Declined (04/22/2023)  Depression (PHQ2-9): Low Risk  (11/18/2018)  Financial Resource Strain: Low Risk  (11/12/2018)  Physical Activity: Inactive (11/12/2018)  Stress: Stress Concern Present (11/12/2018)  Tobacco Use: Low Risk  (04/23/2023)    Readmission Risk Interventions     No data to display

## 2023-04-24 ENCOUNTER — Other Ambulatory Visit (HOSPITAL_COMMUNITY): Payer: Self-pay

## 2023-04-24 DIAGNOSIS — I480 Paroxysmal atrial fibrillation: Secondary | ICD-10-CM | POA: Diagnosis not present

## 2023-04-24 DIAGNOSIS — R251 Tremor, unspecified: Secondary | ICD-10-CM | POA: Diagnosis not present

## 2023-04-24 DIAGNOSIS — G629 Polyneuropathy, unspecified: Secondary | ICD-10-CM | POA: Diagnosis not present

## 2023-04-24 DIAGNOSIS — R1312 Dysphagia, oropharyngeal phase: Secondary | ICD-10-CM | POA: Diagnosis not present

## 2023-04-24 DIAGNOSIS — Z5112 Encounter for antineoplastic immunotherapy: Secondary | ICD-10-CM | POA: Diagnosis not present

## 2023-04-24 DIAGNOSIS — Z7901 Long term (current) use of anticoagulants: Secondary | ICD-10-CM | POA: Diagnosis not present

## 2023-04-24 DIAGNOSIS — C9000 Multiple myeloma not having achieved remission: Secondary | ICD-10-CM | POA: Diagnosis not present

## 2023-04-24 DIAGNOSIS — Z7401 Bed confinement status: Secondary | ICD-10-CM | POA: Diagnosis not present

## 2023-04-24 DIAGNOSIS — D649 Anemia, unspecified: Secondary | ICD-10-CM | POA: Diagnosis not present

## 2023-04-24 DIAGNOSIS — I639 Cerebral infarction, unspecified: Secondary | ICD-10-CM | POA: Diagnosis not present

## 2023-04-24 DIAGNOSIS — R531 Weakness: Secondary | ICD-10-CM | POA: Diagnosis not present

## 2023-04-24 DIAGNOSIS — K219 Gastro-esophageal reflux disease without esophagitis: Secondary | ICD-10-CM | POA: Diagnosis not present

## 2023-04-24 DIAGNOSIS — I959 Hypotension, unspecified: Secondary | ICD-10-CM | POA: Diagnosis not present

## 2023-04-24 DIAGNOSIS — E871 Hypo-osmolality and hyponatremia: Secondary | ICD-10-CM | POA: Diagnosis not present

## 2023-04-24 DIAGNOSIS — I5033 Acute on chronic diastolic (congestive) heart failure: Secondary | ICD-10-CM | POA: Diagnosis not present

## 2023-04-24 DIAGNOSIS — Z743 Need for continuous supervision: Secondary | ICD-10-CM | POA: Diagnosis not present

## 2023-04-24 DIAGNOSIS — I4891 Unspecified atrial fibrillation: Secondary | ICD-10-CM | POA: Diagnosis not present

## 2023-04-24 DIAGNOSIS — I69391 Dysphagia following cerebral infarction: Secondary | ICD-10-CM | POA: Diagnosis not present

## 2023-04-24 DIAGNOSIS — R2689 Other abnormalities of gait and mobility: Secondary | ICD-10-CM | POA: Diagnosis not present

## 2023-04-24 DIAGNOSIS — M6281 Muscle weakness (generalized): Secondary | ICD-10-CM | POA: Diagnosis not present

## 2023-04-24 DIAGNOSIS — B379 Candidiasis, unspecified: Secondary | ICD-10-CM | POA: Diagnosis not present

## 2023-04-24 DIAGNOSIS — I69398 Other sequelae of cerebral infarction: Secondary | ICD-10-CM | POA: Diagnosis not present

## 2023-04-24 DIAGNOSIS — Z79899 Other long term (current) drug therapy: Secondary | ICD-10-CM | POA: Diagnosis not present

## 2023-04-24 DIAGNOSIS — I10 Essential (primary) hypertension: Secondary | ICD-10-CM | POA: Diagnosis not present

## 2023-04-24 DIAGNOSIS — E039 Hypothyroidism, unspecified: Secondary | ICD-10-CM | POA: Diagnosis not present

## 2023-04-24 DIAGNOSIS — Z853 Personal history of malignant neoplasm of breast: Secondary | ICD-10-CM | POA: Diagnosis not present

## 2023-04-24 DIAGNOSIS — I350 Nonrheumatic aortic (valve) stenosis: Secondary | ICD-10-CM | POA: Diagnosis not present

## 2023-04-24 DIAGNOSIS — R0789 Other chest pain: Secondary | ICD-10-CM | POA: Diagnosis not present

## 2023-04-24 DIAGNOSIS — R079 Chest pain, unspecified: Secondary | ICD-10-CM | POA: Diagnosis not present

## 2023-04-24 DIAGNOSIS — R000 Tachycardia, unspecified: Secondary | ICD-10-CM | POA: Diagnosis not present

## 2023-04-24 DIAGNOSIS — I499 Cardiac arrhythmia, unspecified: Secondary | ICD-10-CM | POA: Diagnosis not present

## 2023-04-24 DIAGNOSIS — E785 Hyperlipidemia, unspecified: Secondary | ICD-10-CM | POA: Diagnosis not present

## 2023-04-24 DIAGNOSIS — E559 Vitamin D deficiency, unspecified: Secondary | ICD-10-CM | POA: Diagnosis not present

## 2023-04-24 DIAGNOSIS — R059 Cough, unspecified: Secondary | ICD-10-CM | POA: Diagnosis not present

## 2023-04-24 LAB — RENAL FUNCTION PANEL
Albumin: 2.2 g/dL — ABNORMAL LOW (ref 3.5–5.0)
Anion gap: 9 (ref 5–15)
BUN: 32 mg/dL — ABNORMAL HIGH (ref 8–23)
CO2: 26 mmol/L (ref 22–32)
Calcium: 8.1 mg/dL — ABNORMAL LOW (ref 8.9–10.3)
Chloride: 98 mmol/L (ref 98–111)
Creatinine, Ser: 0.96 mg/dL (ref 0.44–1.00)
GFR, Estimated: 57 mL/min — ABNORMAL LOW (ref >60)
Glucose, Bld: 118 mg/dL — ABNORMAL HIGH (ref 70–99)
Phosphorus: 3.2 mg/dL (ref 2.5–4.6)
Potassium: 3.2 mmol/L — ABNORMAL LOW (ref 3.5–5.1)
Sodium: 133 mmol/L — ABNORMAL LOW (ref 135–145)

## 2023-04-24 MED ORDER — POTASSIUM CHLORIDE CRYS ER 20 MEQ PO TBCR: 20.0000 meq | EXTENDED_RELEASE_TABLET | Freq: Every day | ORAL | 0 refills | Status: DC | PRN

## 2023-04-24 MED ORDER — ENSURE ENLIVE PO LIQD: 237.0000 mL | Freq: Two times a day (BID) | ORAL | 0 refills | Status: DC

## 2023-04-24 MED ORDER — DIGOXIN 62.5 MCG PO TABS: 0.0625 mg | ORAL_TABLET | Freq: Every day | ORAL | 0 refills | Status: DC

## 2023-04-24 MED ORDER — POTASSIUM CHLORIDE CRYS ER 20 MEQ PO TBCR
40.0000 meq | EXTENDED_RELEASE_TABLET | ORAL | Status: AC
  Administered 2023-04-24 (×2): 40 meq via ORAL
  Filled 2023-04-24 (×2): qty 2

## 2023-04-24 MED ORDER — DILTIAZEM HCL ER COATED BEADS 120 MG PO CP24: 120.0000 mg | ORAL_CAPSULE | Freq: Every day | ORAL | 0 refills | Status: DC

## 2023-04-24 MED ORDER — TORSEMIDE 20 MG PO TABS: 20.0000 mg | ORAL_TABLET | Freq: Every day | ORAL | 0 refills | Status: DC | PRN

## 2023-04-24 NOTE — TOC Transition Note (Signed)
Transition of Care [facility]) - CM/SW Discharge Note   Patient Details  Name: Zoe [name] MRN: [id number] Date of Birth: Nov 22, 1933  Transition of Care [facility]) CM/SW Contact:  Eduard Roux, LCSW Phone Number: 04/24/2023, 1:51 PM   Clinical Narrative:     Patient will Discharge to: [facility]  Discharge Date: 04/24/2023 Family Notified: daughter Transport By: Sharin Mons  Per MD patient is ready for discharge. RN, patient, and facility notified of discharge. Discharge Summary sent to facility. RN given number for [phone], Room 100. Ambulance transport requested for patient.   Clinical Social Worker signing off.  Antony Blackbird, MSW, LCSW Clinical Social Worker     Final next level of care: Skilled Nursing Facility Barriers to Discharge: Barriers Resolved   Patient Goals and CMS Choice      Discharge Placement                Patient chooses bed at: [facility] Patient to be transferred to facility by: [facility] Name of family member notified: daughter Patient and family notified of of transfer: 04/24/23  Discharge Plan and Services Additional resources added to the After Visit Summary for   In-house Referral: Clinical Social Work                                   Social Determinants of Health (SDOH) Interventions SDOH Screenings   Food Insecurity: No Food Insecurity (04/22/2023)  Housing: Patient Declined (04/22/2023)  Transportation Needs: No Transportation Needs (04/22/2023)  Utilities: Patient Declined (04/22/2023)  Depression (PHQ2-9): Low Risk  (11/18/2018)  Financial Resource Strain: Low Risk  (11/12/2018)  Physical Activity: Inactive (11/12/2018)  Stress: Stress Concern Present (11/12/2018)  Tobacco Use: Low Risk  (04/23/2023)     Readmission Risk Interventions     No data to display

## 2023-04-24 NOTE — Discharge Summary (Signed)
Physician Discharge Summary   Patient: Zoe [name] MRN: [id number] DOB: 1934/04/12  Admit date:     04/15/2023  Discharge date: 04/24/23  Discharge Physician: York Ram Carlester Kasparek   PCP: Irena Reichmann, DO   Recommendations at discharge:    Patient has been placed on diltiazem and digoxin for rate control atrial fibrillation. Dose of metoprolol has been increased to 50 mg po bid. Check digoxin level in 3 days.  Continue taking loop diuretic, torsemide, as needed for signs of volume overload, weight increase 2 to 3 lbs in 24 hrs or 5 lbs in 7 days.  Follow up with Hematology for multiple myeloma as outpatient as scheduled. Follow up with Cardiology as scheduled.   I spoke with patient's son over the phone, we talked in detail about patient's condition, plan of care and prognosis and all questions were addressed.   Discharge Diagnoses: Principal Problem:   Paroxysmal atrial fibrillation (HCC) Active Problems:   Acute on chronic diastolic heart failure (HCC)   Hyponatremia   Hypothyroidism   Hypertension   Hyperlipidemia   History of stroke   Multiple myeloma not having achieved remission (HCC)   Atrial fibrillation (HCC)   Atrial fibrillation with rapid ventricular response (HCC)   Atypical atrial flutter (HCC)  Resolved Problems:   * No resolved hospital problems. [facility] Course: Zoe [name] was admitted to the hospital with the working diagnosis of atrial fibrillation with rapid ventricular response.   88 yo female with the past medical history of atrial fibrillation, aortic stenosis sp TAVR, hypothyroid, hypertension, multiple myeloma who presented with palpitations. Reported dyspnea for 2 days. She had acute onset of palpitations, her blood pressure was 90/50 and HR 155, EMS was called and it was attempted direct current cardioversion 200 J, with converting to sinus rhythm. Her lungs were clear to auscultation, with no wheezing or rales, heart with S1 and S2  present and rhythmic, with no gallops, positive murmur at the right lower sternal border, abdomen with no distention and no lower extremity edema.   Na 133, K 3.5 Cl 93, bicarbonate 25 glucose 109, bun 33 cr 1,0 High sensitive troponin 263, 255, 120  Wbc 9,8 hgb 13.4 plt 184   Urine analysis SG 1,023, protein negative, > 500 glucose, negative leukocytes and negative Hgb.  Urine osmolality 694   Chest radiograph with cardiomegaly, no infiltrates or effusions, right base atelectasis.   EKG 86 bpm, normal axis, normal intervals, sinus rhythm with right atrial enlargement, no significant ST segment or T wave changes. Positive LVH.   Hip radiographs with no acute fractures.   06/25 last night patient converted back to atrial fibrillation with rapid ventricular response. She has been placed on IV diltiazem and IV amiodarone.  She had confusion this am, required bilateral mittens.  06/26 mentation has improved, unsuccessful direct current cardioversion. 06/27 continue atrial fibrillation rhythm with improvement in rate control.  06/28 patient had direct current cardioversion, with brief sinus rhythm, but then back in atrial fibrillation with RVR.  06/29 continue in atrial fibrillation, some improvement in hear rate.  06/30 improved heart rate 100's. Pending transfer to SNF.  07/01 patient converted to sinus rhythm, with bradycardia.  07/02 patient continue sinus rhythm with rate in the 60's.   Assessment and Plan: * Paroxysmal atrial fibrillation (HCC) Sp 2 inpatient cardioversion.  Telemetry continue with sinus bradycardia with mean heart rate 60 bpm.   Plan to continue with digoxin, metoprolol 50 mg bid, diltiazem 120 mg and amiodarone  200 mg daily.   Check digoxin level in 3 days.   Anticoagulation with apixaban.  Out of bed to chair tid with meals Pt and Ot.   Elevated cardiac markers elevated, high sensitive troponin, possible due to RVR.  Old records personally reviewed 2019  coronary angiography with normal coronaries.   Acute on chronic diastolic heart failure (HCC) Echocardiogram with preserved LV systolic function with EF 60 to 65%, moderate LVH, RV systolic function preserved, RVSP 41.4 mmHg. Sp TAVR, LA with moderate dilatation, RA normal size, no pericardial effusion, no significant valvular disease.   Continue with SGLT 2 inh and metoprolol.  Recommend to use torsemide only as needed for signs of volume overload.   Hyponatremia AKI, Hypokalemia. Hypomagnesemia   Renal function has improved with a serum cr of 0,96 with K at 3,2 and serum bicarbonate at 26. Na 133 and P 3,2   Plan to continue K correction with Kcl today and follow up renal function and electrolytes as outpatient in 7 days.   Hypothyroidism Continue levothyroxine.  Hypertension Continue with metoprolol 50 mg po bid.   Hyperlipidemia Continue with statin therapy  History of stroke Resumed anticoagulation with apixaban.   Mild cognitive impairment. Intermittent confusion, that has resolved.   Continue to promote adequate sleep and nutrition.  Mentation is back to baseline.   Multiple myeloma not having achieved remission (HCC) Follow up as outpatient.          Consultants: cardiology  Procedures performed: direct current cardioversion x2   Disposition: Skilled nursing facility Diet recommendation:  Discharge Diet Orders (From admission, onward)     Start     Ordered   04/24/23 0000  Diet - low sodium heart healthy        04/24/23 1240           Cardiac diet DISCHARGE MEDICATION: Allergies as of 04/24/2023       Reactions   Penicillins Other (See Comments)   UNSPECIFIED REACTION  Patient does not remember reaction.  Has patient had a PCN reaction causing immediate rash, facial/tongue/throat swelling, SOB or lightheadedness with hypotension: no Has patient had a PCN reaction causing severe rash involving mucus membranes or skin necrosis: no Has patient  had a PCN reaction that required hospitalization no Has patient had a PCN reaction occurring within the last 10 years: no If all of the above answers are "NO", then may proceed with Cephalosporin use.   Sulfa Antibiotics Other (See Comments)   UNSPECIFIED REACTION  "maybe vision issues? "        Medication List     STOP taking these medications    benzonatate 100 MG capsule Commonly known as: TESSALON   dexamethasone 4 MG tablet Commonly known as: DECADRON   furosemide 40 MG tablet Commonly known as: LASIX   guaiFENesin 600 MG 12 hr tablet Commonly known as: MUCINEX   lenalidomide 25 MG capsule Commonly known as: REVLIMID   metoprolol succinate 25 MG 24 hr tablet Commonly known as: TOPROL-XL   nystatin powder Commonly known as: MYCOSTATIN/NYSTOP       TAKE these medications    acyclovir 400 MG tablet Commonly known as: ZOVIRAX Take 1 tablet (400 mg total) by mouth 2 (two) times daily.   amiodarone 200 MG tablet Commonly known as: Pacerone Take 1 tablet (200 mg total) by mouth daily. What changed: how much to take   apixaban 5 MG Tabs tablet Commonly known as: ELIQUIS Take 1 tablet (5 mg total) by mouth  2 (two) times daily.   [employer] VITAMIN B12 1000 MCG tablet Generic drug: cyanocobalamin TAKE 1 TABLET BY MOUTH EVERY DAY   Digoxin 62.5 MCG Tabs Take 0.0625 mg by mouth daily. Start taking on: April 25, 2023   diltiazem 120 MG 24 hr capsule Commonly known as: CARDIZEM CD Take 1 capsule (120 mg total) by mouth daily. Start taking on: April 25, 2023   esomeprazole 20 MG capsule Commonly known as: NEXIUM Take 20 mg by mouth daily as needed (Heartburn).   Farxiga 10 MG Tabs tablet Generic drug: dapagliflozin propanediol TAKE 1 TABLET BY MOUTH EVERY DAY   feeding supplement Liqd Take 237 mLs by mouth 2 (two) times daily between meals. Start taking on: April 25, 2023   levothyroxine 100 MCG tablet Commonly known as: SYNTHROID Take 100 mcg by mouth daily  before breakfast.   metoprolol tartrate 50 MG tablet Commonly known as: LOPRESSOR Take 1 tablet (50 mg total) by mouth 2 (two) times daily.   OVER THE COUNTER MEDICATION Take 1 capsule by mouth in the morning and at bedtime. AREDS   polyethylene glycol 17 g packet Commonly known as: MIRALAX / GLYCOLAX Take 17 g by mouth daily as needed for mild constipation.   potassium chloride SA 20 MEQ tablet Commonly known as: KLOR-CON M Take 1 tablet (20 mEq total) by mouth daily as needed (take when taking torsemide.). What changed:  medication strength when to take this reasons to take this   pravastatin 40 MG tablet Commonly known as: PRAVACHOL Take 40 mg by mouth every evening.   sennosides-docusate sodium 8.6-50 MG tablet Commonly known as: SENOKOT-S Take 1 tablet by mouth daily as needed for constipation.   Soothe XP Soln Place 1 drop into both eyes every evening.   torsemide 20 MG tablet Commonly known as: DEMADEX Take 1 tablet (20 mg total) by mouth daily as needed (increase weight 2 to 3 lbs in 24 hrs or 5 lbs in 7 days.).   Vitamin D3 50 MCG (2000 UT) capsule Take 1 capsule (2,000 Units total) by mouth daily.        Contact information for follow-up providers     Yates Decamp, MD Follow up on 05/09/2023.   Specialty: Cardiology Contact information: [address] [place] [address] [phone]              Contact information for after-discharge care     Destination     [facility] Preferred SNF .   Service: Skilled Nursing Contact information: [address] [place] [address] [phone]                    Discharge Exam: Ceasar Mons Weights   04/15/23 1758 04/17/23 0118 04/17/23 0200  Weight: 67.6 kg 68.9 kg 68.9 kg   BP (!) 151/58   Pulse 60   Temp 97.7 F (36.5 C) (Oral)   Resp 18   Ht 5\' 3"  (1.6 m)   Wt 68.9 kg   SpO2 94%   BMI 26.89 kg/m   Subjective: Patient with no chest pain or  dyspnea, no edema. Continue very weak and deconditioned, not back to her baseline   Neurology awake and alert ENT with mild pallor Cardiovascular with S1 and S2 present and rhythmic, with no gallops, rubs or murmurs  Respiratory with no rales or wheezing Abdomen with no distention  No lower extremity edema  Condition at discharge: stable  The results of significant diagnostics  from this hospitalization (including imaging, microbiology, ancillary and laboratory) are listed below for reference.   Imaging Studies: EP STUDY  Result Date: 04/20/2023 See surgical note for result.  EP STUDY  Result Date: 04/19/2023 See surgical note for result.  ECHOCARDIOGRAM COMPLETE  Result Date: 04/16/2023    ECHOCARDIOGRAM REPORT   Patient Name:   SHRYL FEHRMANN Date of Exam: 04/16/2023 Medical Rec #:  [id number]           Height:       63.0 in Accession #:    [id number]          Weight:       149.0 lb Date of Birth:  1934-03-14            BSA:          1.706 m Patient Age:    89 years            BP:           114/49 mmHg Patient Gender: F                   HR:           81 bpm. Exam Location:  Inpatient Procedure: 2D Echo, Color Doppler and Cardiac Doppler Indications:    R07.9* Chest pain, unspecified  History:        Patient has prior history of Echocardiogram examinations, most                 recent 02/17/2023. CHF, Abnormal ECG, Stroke, Aortic Valve                 Disease, Arrythmias:Atrial Fibrillation, Signs/Symptoms:Chest                 Pain; Risk Factors:Hypertension and Dyslipidemia. Breast cancer.                 Aortic stenosis.                 Aortic Valve: 23 mm 3 THV size 23 mm Sapien prosthetic, stented                 (TAVR) valve is present in the aortic position. Procedure Date:                 09/03/2018.  Sonographer:    Sheralyn Boatman RDCS Referring Phys: [id number] ANASTASSIA DOUTOVA IMPRESSIONS  1. Left ventricular ejection fraction, by estimation, is 60 to 65%. The left ventricle has normal  function. The left ventricle has no regional wall motion abnormalities. There is moderate concentric left ventricular hypertrophy. Left ventricular diastolic parameters are consistent with Grade II diastolic dysfunction (pseudonormalization). Elevated left ventricular end-diastolic pressure.  2. Right ventricular systolic function is normal. The right ventricular size is normal. There is mildly elevated pulmonary artery systolic pressure. The estimated right ventricular systolic pressure is 41.4 mmHg.  3. Left atrial size was moderately dilated.  4. The mitral valve is normal in structure. Mild mitral valve regurgitation.  5. The aortic valve has been repaired/replaced. Aortic valve regurgitation is mild. No aortic stenosis is present. There is a 23 mm 3 THV size 23 mm Sapien prosthetic (TAVR) valve present in the aortic position. Procedure Date: 09/03/2018.  6. The inferior vena cava is normal in size with <50% respiratory variability, suggesting right atrial pressure of 8 mmHg. Comparison(s): A prior study was performed on 02/17/2023. No significant change from prior study. FINDINGS  Left Ventricle: Left ventricular ejection  fraction, by estimation, is 60 to 65%. The left ventricle has normal function. The left ventricle has no regional wall motion abnormalities. The left ventricular internal cavity size was normal in size. There is  moderate concentric left ventricular hypertrophy. Left ventricular diastolic parameters are consistent with Grade II diastolic dysfunction (pseudonormalization). Elevated left ventricular end-diastolic pressure. Right Ventricle: The right ventricular size is normal. No increase in right ventricular wall thickness. Right ventricular systolic function is normal. There is mildly elevated pulmonary artery systolic pressure. The tricuspid regurgitant velocity is 2.89  m/s, and with an assumed right atrial pressure of 8 mmHg, the estimated right ventricular systolic pressure is 41.4 mmHg.  Left Atrium: Left atrial size was moderately dilated. Right Atrium: Right atrial size was normal in size. Pericardium: There is no evidence of pericardial effusion. Mitral Valve: The mitral valve is normal in structure. There is mild calcification of the mitral valve leaflet(s). Mild mitral annular calcification. Mild mitral valve regurgitation. MV peak gradient, 12.4 mmHg. The mean mitral valve gradient is 4.0 mmHg. Tricuspid Valve: The tricuspid valve is normal in structure. Tricuspid valve regurgitation is mild . No evidence of tricuspid stenosis. Aortic Valve: The aortic valve has been repaired/replaced. Aortic valve regurgitation is mild. No aortic stenosis is present. Aortic valve mean gradient measures 17.0 mmHg. Aortic valve peak gradient measures 29.5 mmHg. Aortic valve area, by VTI measures  1.84 cm. There is a 23 mm 3 THV size 23 mm Sapien prosthetic, stented (TAVR) valve present in the aortic position. Procedure Date: 09/03/2018. Pulmonic Valve: The pulmonic valve was normal in structure. Pulmonic valve regurgitation is trivial. No evidence of pulmonic stenosis. Aorta: The aortic root is normal in size and structure. Venous: The inferior vena cava is normal in size with less than 50% respiratory variability, suggesting right atrial pressure of 8 mmHg. IAS/Shunts: No atrial level shunt detected by color flow Doppler.  LEFT VENTRICLE PLAX 2D LVIDd:         3.50 cm   Diastology LVIDs:         2.40 cm   LV e' medial:    4.90 cm/s LV PW:         1.60 cm   LV E/e' medial:  32.7 LV IVS:        1.80 cm   LV e' lateral:   5.87 cm/s LVOT diam:     2.20 cm   LV E/e' lateral: 27.3 LV SV:         102 LV SV Index:   60 LVOT Area:     3.80 cm  RIGHT VENTRICLE             IVC RV S prime:     12.00 cm/s  IVC diam: 1.70 cm TAPSE (M-mode): 1.6 cm LEFT ATRIUM             Index        RIGHT ATRIUM           Index LA diam:        4.90 cm 2.87 cm/m   RA Area:     15.50 cm LA Vol (A2C):   17.3 ml 10.14 ml/m  RA Volume:    36.00 ml  21.10 ml/m LA Vol (A4C):   68.1 ml 39.91 ml/m LA Biplane Vol: 37.0 ml 21.68 ml/m  AORTIC VALVE                     PULMONIC VALVE AV Area (Vmax):  1.75 cm      PR End Diast Vel: 1.94 msec AV Area (Vmean):   1.79 cm AV Area (VTI):     1.84 cm AV Vmax:           271.75 cm/s AV Vmean:          183.000 cm/s AV VTI:            0.554 m AV Peak Grad:      29.5 mmHg AV Mean Grad:      17.0 mmHg LVOT Vmax:         125.00 cm/s LVOT Vmean:        86.200 cm/s LVOT VTI:          0.268 m LVOT/AV VTI ratio: 0.48  AORTA Ao Root diam: 2.70 cm Ao Asc diam:  3.10 cm MITRAL VALVE                TRICUSPID VALVE MV Area (PHT): 3.17 cm     TR Peak grad:   33.4 mmHg MV Area VTI:   2.33 cm     TR Vmax:        289.00 cm/s MV Peak grad:  12.4 mmHg MV Mean grad:  4.0 mmHg     SHUNTS MV Vmax:       1.76 m/s     Systemic VTI:  0.27 m MV Vmean:      93.9 cm/s    Systemic Diam: 2.20 cm MV Decel Time: 239 msec MV E velocity: 160.00 cm/s MV A velocity: 114.00 cm/s MV E/A ratio:  1.40 Yates Decamp MD Electronically signed by Yates Decamp MD Signature Date/Time: 04/16/2023/10:07:14 PM    Final    DG HIPS BILAT WITH PELVIS 2V  Result Date: 04/16/2023 CLINICAL DATA:  Status post fall 5 days ago with subsequent right hip pain. EXAM: DG HIP (WITH OR WITHOUT PELVIS) 2V BILAT COMPARISON:  None Available. FINDINGS: There is no evidence of hip fracture or dislocation. Moderate to marked severity degenerative changes are seen involving both hips, in the form of joint space narrowing and acetabular sclerosis. IMPRESSION: Degenerative changes involving both hips, without evidence of an acute osseous abnormality. Electronically Signed   By: Aram Candela M.D.   On: 04/16/2023 01:06   DG Chest Portable 1 View  Result Date: 04/15/2023 CLINICAL DATA:  Atrial fibrillation EXAM: PORTABLE CHEST 1 VIEW COMPARISON:  04/13/2023 FINDINGS: Stable cardiomediastinal silhouette. TAVR. Aortic atherosclerotic calcification. Bibasilar atelectasis. No  pleural effusion or pneumothorax. IMPRESSION: Stable exam since 04/13/2023.  Bibasilar atelectasis. Electronically Signed   By: Minerva Fester M.D.   On: 04/15/2023 19:04   CT Head Wo Contrast  Result Date: 04/13/2023 CLINICAL DATA:  Head trauma.  Status post fall. EXAM: CT HEAD WITHOUT CONTRAST TECHNIQUE: Contiguous axial images were obtained from the base of the skull through the vertex without intravenous contrast. RADIATION DOSE REDUCTION: This exam was performed according to the departmental dose-optimization program which includes automated exposure control, adjustment of the mA and/or kV according to patient size and/or use of iterative reconstruction technique. COMPARISON:  MRI 03/13/2023 FINDINGS: Brain: No evidence of acute infarction, hemorrhage, hydrocephalus, extra-axial collection or mass lesion/mass effect. There is patchy low-attenuation within the subcortical and periventricular white matter compatible with chronic microvascular disease. Vascular: No hyperdense vessel or unexpected calcification. Skull: Normal. Negative for fracture or focal lesion. Sinuses/Orbits: Opacification of the sphenoid sinus. Postsurgical changes from ethmoidectomy. Bilateral median antrectomy. Mastoid air cells are clear. Other: No significant scalp hematoma. IMPRESSION:  1. No acute intracranial abnormalities. 2. Chronic microvascular disease 3. Chronic sinus disease. Electronically Signed   By: Signa Kell M.D.   On: 04/13/2023 11:43   DG Chest 2 View  Result Date: 04/13/2023 CLINICAL DATA:  Chest pain.  Multiple myeloma. EXAM: CHEST - 2 VIEW COMPARISON:  Radiographs 03/23/2023 and 02/17/2023. FINDINGS: Stable cardiomegaly and aortic atherosclerosis post TAVR procedure. There is mild atelectasis or scarring at both lung bases which appears unchanged. Ill-defined density projecting over the right lung apex on the frontal examination has no definite correlate on the lateral view and may relate to overlying soft  tissues. No focal airspace disease, pneumothorax or significant pleural effusion identified. The bones appear unchanged status post 2 level spinal augmentation. Telemetry leads overlie the chest. IMPRESSION: No definite acute cardiopulmonary process identified. Stable cardiomegaly and mild bibasilar atelectasis or scarring. Electronically Signed   By: Carey Bullocks M.D.   On: 04/13/2023 10:25    Microbiology: Results for orders placed or performed during the hospital encounter of 04/15/23  Surgical PCR screen     Status: None   Collection Time: 04/17/23  1:58 AM   Specimen: Nasal Mucosa; Nasal Swab  Result Value Ref Range Status   MRSA, PCR NEGATIVE NEGATIVE Final   Staphylococcus aureus NEGATIVE NEGATIVE Final    Comment: (NOTE) The Xpert SA Assay (FDA approved for NASAL specimens in patients 48 years of age and older), is one component of a comprehensive surveillance program. It is not intended to diagnose infection nor to guide or monitor treatment. Performed at [facility], [address], [place], [place] [address]     Labs: CBC: Recent Labs  Lab 04/18/23 0131 04/19/23 0156 04/20/23 0121  WBC 11.1* 8.4 6.4  HGB 13.4 12.1 12.3  HCT 40.9 37.0 38.1  MCV 86.5 85.1 85.0  PLT 141* 176 158   Basic Metabolic Panel: Recent Labs  Lab 04/19/23 0156 04/20/23 0121 04/21/23 0110 04/22/23 0209 04/24/23 0119  NA 131* 132* 133* 136 133*  K 3.0* 3.6 3.8 3.9 3.2*  CL 95* 97* 97* 94* 98  CO2 26 26 26 25 26   GLUCOSE 132* 108* 98 113* 118*  BUN 27* 26* 22 28* 32*  CREATININE 1.08* 1.04* 0.88 1.20* 0.96  CALCIUM 7.6* 7.8* 7.9* 8.4* 8.1*  MG 1.9 1.8 2.2  --   --   PHOS  --   --   --   --  3.2   Liver Function Tests: Recent Labs  Lab 04/24/23 0119  ALBUMIN 2.2*   CBG: No results for input(s): "GLUCAP" in the last 168 hours.  Discharge time spent: greater than 30 minutes.  Signed: Coralie Keens, MD [facility] 04/24/2023

## 2023-04-24 NOTE — Progress Notes (Signed)
Report given to Lateshia [phone], at [facility]. All questions answered. AVS printed and ready up front.  Kenard Gower, RN

## 2023-04-24 NOTE — Care Management Important Message (Signed)
Important Message  Patient Details  Name: Zoe [name] MRN: [id number] Date of Birth: 10/04/1934   Medicare Important Message Given:  Yes     Renie Ora 04/24/2023, 11:22 AM

## 2023-04-24 NOTE — Progress Notes (Signed)
Mobility Specialist Progress Note:   04/24/23 1500  Mobility  Activity Ambulated with assistance in hallway  Level of Assistance Contact guard assist, steadying assist  Assistive Device Front wheel walker  Distance Ambulated (ft) 180 ft  Activity Response Tolerated well  Mobility Referral Yes  $Mobility charge 1 Mobility  Mobility Specialist Start Time (ACUTE ONLY) 1430  Mobility Specialist Stop Time (ACUTE ONLY) 1457  Mobility Specialist Time Calculation (min) (ACUTE ONLY) 27 min    Pre Mobility: 55 HR During Mobility: 68 HR Post Mobility:  57 HR  Pt received in bed, agreeable to mobility. Mod I for bed mobility. Ambulated in hallway w/ RW and CG. C/o BLE pain, but mostly in R knee. VSS throughout. Pt left in bed with call bell and all needs met.  [place] Earlene Plater Mobility Specialist Please contact via Special educational needs teacher or Rehab office at [phone]

## 2023-04-24 NOTE — Consult Note (Signed)
   [facility] CM Inpatient Consult   04/24/2023  Zoe [name] [birth year] [id number]  [facility] [[facility]]  Accountable Care Organization [ACO] Patient: [name]  Primary Care Provider:  Irena Reichmann, DO with [facility]   Patient was reviewed for less than 7 day readmission extreme high risk score with an 8 day length of stay to assess for barriers to care in the community.  Patient was screened for hospitalization and on behalf of [facility] Care Coordination to assess for post hospital community care needs.  Patient is being considered for a skilled nursing facility level of care for post hospital transition.  Patient is currently for [facility] which is affiliated.  If the patient goes to a [facility] affiliated facility then, patient can be followed by [facility] RN with traditional Medicare and approved Medicare Advantage plans.    Plan:   Will notify the Community [facility] RN can follow for any known or needs for transitional care needs for returning to post facility care coordination needs to return to community.  For questions or referrals, please contact:   Charlesetta Shanks, RN BSN CCM [facility]  [phone] business mobile phone Toll free office [phone]  *Concierge Line  [phone] Fax number: [phone] [email or web address]

## 2023-04-24 NOTE — Progress Notes (Addendum)
Progress Note   Patient: JERMICA CRAMM ZOX:[id number] DOB: [birth year] DOA: 04/15/2023     8 DOS: the patient was seen and examined on 04/24/2023   Brief hospital course: Mrs. Snopek was admitted to the hospital with the working diagnosis of atrial fibrillation with rapid ventricular response.   89 yo female with the past medical history of atrial fibrillation, aortic stenosis sp TAVR, hypothyroid, hypertension, multiple myeloma who presented with palpitations. Reported dyspnea for 2 days. She had acute onset of palpitations, her blood pressure was 90/50 and HR 155, EMS was called and it was attempted direct current cardioversion 200 J, with converting to sinus rhythm. Her lungs were clear to auscultation, with no wheezing or rales, heart with S1 and S2 present and rhythmic, with no gallops, positive murmur at the right lower sternal border, abdomen with no distention and no lower extremity edema.   Na 133, K 3.5 Cl 93, bicarbonate 25 glucose 109, bun 33 cr 1,0 High sensitive troponin 263, 255, 120  Wbc 9,8 hgb 13.4 plt 184   Urine analysis SG 1,023, protein negative, > 500 glucose, negative leukocytes and negative Hgb.  Urine osmolality 694   Chest radiograph with cardiomegaly, no infiltrates or effusions, right base atelectasis.   EKG 86 bpm, normal axis, normal intervals, sinus rhythm with right atrial enlargement, no significant ST segment or T wave changes. Positive LVH.   Hip radiographs with no acute fractures.   06/25 last night patient converted back to atrial fibrillation with rapid ventricular response. She has been placed on IV diltiazem and IV amiodarone.  She had confusion this am, required bilateral mittens.  06/26 mentation has improved, unsuccessful direct current cardioversion. 06/27 continue atrial fibrillation rhythm with improvement in rate control.  06/28 patient had direct current cardioversion, with brief sinus rhythm, but then back in atrial fibrillation with  RVR.  06/29 continue in atrial fibrillation, some improvement in hear rate.  06/30 improved heart rate 100's. Pending transfer to SNF.  07/01 patient converted to sinus rhythm, with bradycardia.  07/02 patient continue sinus rhythm with rate in the 60's.   Assessment and Plan: * Paroxysmal atrial fibrillation (HCC) Sp 2 inpatient cardioversion.  Telemetry continue with sinus bradycardia with mean heart rate 60 bpm.   Plan to continue with digoxin, metoprolol 50 mg bid, diltiazem 120 mg and amiodarone 200 mg daily.  Continue telemetry monitoring.  Check digoxin level.   Anticoagulation with apixaban.  Out of bed to chair tid with meals Pt and Ot.   Elevated cardiac markers elevated, high sensitive troponin, possible due to RVR.  Old records personally reviewed 2019 coronary angiography with normal coronaries.   Acute on chronic diastolic heart failure (HCC) Echocardiogram with preserved LV systolic function with EF 60 to 65%, moderate LVH, RV systolic function preserved, RVSP 41.4 mmHg. Sp TAVR, LA with moderate dilatation, RA normal size, no pericardial effusion, no significant valvular disease.   Continue with SGLT 2 inh and metoprolol.  Continue to hold torsemide for now.   Hyponatremia AKI, Hypokalemia. Hypomagnesemia   Renal function has improved with a serum cr of 0,96 with K at 3,2 and serum bicarbonate at 26. Na 133 and P 3,2   Plan to continue K correction with Kcl and follow up renal function and electrolytes in am.  Continue to hold on torsemide.   Hypothyroidism Continue levothyroxine.  Hypertension Continue with metoprolol 50 mg po bid.   Hyperlipidemia Continue with statin therapy  History of stroke Resumed anticoagulation with apixaban.  Mild cognitive impairment. Intermittent confusion,  No clear encephalopathy. Patient at high risk of delirium.  Plan to continue close neuro checks, promote adequate sleep and nutrition.  Mentation is back to  baseline.   Multiple myeloma not having achieved remission (HCC) Follow up as outpatient.       Subjective: Patient with no chest pain or dyspnea, no edema. Continue very weak and deconditioned, not back to her baseline   Physical Exam: Vitals:   04/23/23 1100 04/23/23 1703 04/23/23 2331 04/24/23 0809  BP: (!) 137/58 (!) 146/51 (!) 140/59 (!) 150/60  Pulse: 62 60 63 60  Resp: 18 18  18   Temp: 97.7 F (36.5 C) 97.8 F (36.6 C) 98.1 F (36.7 C) 98.1 F (36.7 C)  TempSrc: Oral Oral Oral Oral  SpO2: 94% 94% 98% 98%  Weight:      Height:       Neurology awake and alert ENT with mild pallor Cardiovascular with S1 and S2 present and rhythmic, with no gallops, rubs or murmurs  Respiratory with no rales or wheezing Abdomen with no distention  No lower extremity edema  Data Reviewed:   Family Communication: no family at the bedside.  I spoke with patient's son over the phone, we talked in detail about patient's condition, plan of care and prognosis and all questions were addressed.   Disposition: Status is: Inpatient Remains inpatient appropriate because: pending transfer to SNF   Planned Discharge Destination: Skilled nursing facility   Author: Coralie Keens, MD 04/24/2023 9:50 AM  For on call review [email or web address].

## 2023-04-24 NOTE — TOC Progression Note (Addendum)
Transition of Care [facility]) - Progression Note    Patient Details  Name: Zoe [name] MRN: [id number] Date of Birth: [birth year]  Transition [facility]) CM/SW Contact  Eduard Roux, [place] Phone Number: 04/24/2023, 12:10 PM  Clinical Narrative:     Received insurance authorization for SNF [id number] reference # [id number] 7/2-7/5.  Expected Discharge Plan: Skilled Nursing Facility Barriers to Discharge: Insurance Authorization  Expected Discharge Plan and Services In-house Referral: Clinical Social Work     Living arrangements for the past 2 months: Single Family Home                                       Social Determinants of Health (SDOH) Interventions SDOH Screenings   Food Insecurity: No Food Insecurity (04/22/2023)  Housing: Patient Declined (04/22/2023)  Transportation Needs: No Transportation Needs (04/22/2023)  Utilities: Patient Declined (04/22/2023)  Depression (PHQ2-9): Low Risk  (11/18/2018)  Financial Resource Strain: Low Risk  (11/12/2018)  Physical Activity: Inactive (11/12/2018)  Stress: Stress Concern Present (11/12/2018)  Tobacco Use: Low Risk  (04/23/2023)    Readmission Risk Interventions     No data to display

## 2023-04-24 NOTE — Anesthesia Postprocedure Evaluation (Signed)
Anesthesia Post Note  Patient: Zoe [name]  Procedure(s) Performed: CARDIOVERSION     Patient location during evaluation: Cath Lab Anesthesia Type: General Level of consciousness: awake and alert Pain management: pain level controlled Vital Signs Assessment: post-procedure vital signs reviewed and stable Respiratory status: spontaneous breathing, nonlabored ventilation and respiratory function stable Cardiovascular status: stable Postop Assessment: no apparent nausea or vomiting Anesthetic complications: no   There were no known notable events for this encounter.  Last Vitals:  Vitals:   04/24/23 0809 04/24/23 1228  BP: (!) 150/60 (!) 151/58  Pulse: 60   Resp: 18   Temp: 36.7 C 36.5 C  SpO2: 98% 94%    Last Pain:  Vitals:   04/24/23 1228  TempSrc: Oral  PainSc:                  Aveer Bartow

## 2023-04-24 NOTE — Anesthesia Postprocedure Evaluation (Signed)
Anesthesia Post Note  Patient: Zoe [name]  Procedure(s) Performed: CARDIOVERSION     Patient location during evaluation: Cath Lab Anesthesia Type: General Level of consciousness: awake and alert Pain management: pain level controlled Vital Signs Assessment: post-procedure vital signs reviewed and stable Respiratory status: spontaneous breathing, nonlabored ventilation and respiratory function stable Cardiovascular status: stable Postop Assessment: no apparent nausea or vomiting Anesthetic complications: no   No notable events documented.  Last Vitals:  Vitals:   04/24/23 0809 04/24/23 1228  BP: (!) 150/60 (!) 151/58  Pulse: 60   Resp: 18   Temp: 36.7 C 36.5 C  SpO2: 98% 94%    Last Pain:  Vitals:   04/24/23 1228  TempSrc: Oral  PainSc:                  Donnielle Addison

## 2023-04-24 NOTE — TOC Progression Note (Addendum)
Transition of Care [facility]) - Progression Note    Patient Details  Name: Zoe [name] MRN: [id number] Date of Birth: 08/07/1934  Transition of Care [facility]) CM/SW Contact  Eduard Roux, [place] Phone Number: 04/24/2023, 10:12 AM  Clinical Narrative:     Insurance authorization for SNF pending.   Expected Discharge Plan: Skilled Nursing Facility Barriers to Discharge: Insurance Authorization  Expected Discharge Plan and Services In-house Referral: Clinical Social Work     Living arrangements for the past 2 months: Single Family Home                                       Social Determinants of Health (SDOH) Interventions SDOH Screenings   Food Insecurity: No Food Insecurity (04/22/2023)  Housing: Patient Declined (04/22/2023)  Transportation Needs: No Transportation Needs (04/22/2023)  Utilities: Patient Declined (04/22/2023)  Depression (PHQ2-9): Low Risk  (11/18/2018)  Financial Resource Strain: Low Risk  (11/12/2018)  Physical Activity: Inactive (11/12/2018)  Stress: Stress Concern Present (11/12/2018)  Tobacco Use: Low Risk  (04/23/2023)    Readmission Risk Interventions     No data to display

## 2023-04-25 DIAGNOSIS — M6281 Muscle weakness (generalized): Secondary | ICD-10-CM | POA: Diagnosis not present

## 2023-04-25 DIAGNOSIS — K219 Gastro-esophageal reflux disease without esophagitis: Secondary | ICD-10-CM | POA: Diagnosis not present

## 2023-04-25 DIAGNOSIS — E039 Hypothyroidism, unspecified: Secondary | ICD-10-CM | POA: Diagnosis not present

## 2023-04-25 DIAGNOSIS — I480 Paroxysmal atrial fibrillation: Secondary | ICD-10-CM | POA: Diagnosis not present

## 2023-04-25 DIAGNOSIS — I10 Essential (primary) hypertension: Secondary | ICD-10-CM | POA: Diagnosis not present

## 2023-04-25 DIAGNOSIS — E785 Hyperlipidemia, unspecified: Secondary | ICD-10-CM | POA: Diagnosis not present

## 2023-04-27 DIAGNOSIS — I480 Paroxysmal atrial fibrillation: Secondary | ICD-10-CM | POA: Diagnosis not present

## 2023-04-27 DIAGNOSIS — Z79899 Other long term (current) drug therapy: Secondary | ICD-10-CM | POA: Diagnosis not present

## 2023-04-27 DIAGNOSIS — I10 Essential (primary) hypertension: Secondary | ICD-10-CM | POA: Diagnosis not present

## 2023-04-27 DIAGNOSIS — B379 Candidiasis, unspecified: Secondary | ICD-10-CM | POA: Diagnosis not present

## 2023-04-30 DIAGNOSIS — I5033 Acute on chronic diastolic (congestive) heart failure: Secondary | ICD-10-CM | POA: Diagnosis not present

## 2023-04-30 DIAGNOSIS — I480 Paroxysmal atrial fibrillation: Secondary | ICD-10-CM | POA: Diagnosis not present

## 2023-04-30 DIAGNOSIS — R2689 Other abnormalities of gait and mobility: Secondary | ICD-10-CM | POA: Diagnosis not present

## 2023-04-30 DIAGNOSIS — R251 Tremor, unspecified: Secondary | ICD-10-CM | POA: Diagnosis not present

## 2023-04-30 DIAGNOSIS — M6281 Muscle weakness (generalized): Secondary | ICD-10-CM | POA: Diagnosis not present

## 2023-05-01 ENCOUNTER — Ambulatory Visit: Payer: Medicare Other | Admitting: Internal Medicine

## 2023-05-01 DIAGNOSIS — E039 Hypothyroidism, unspecified: Secondary | ICD-10-CM | POA: Diagnosis not present

## 2023-05-01 DIAGNOSIS — I10 Essential (primary) hypertension: Secondary | ICD-10-CM | POA: Diagnosis not present

## 2023-05-01 DIAGNOSIS — E785 Hyperlipidemia, unspecified: Secondary | ICD-10-CM | POA: Diagnosis not present

## 2023-05-01 DIAGNOSIS — I480 Paroxysmal atrial fibrillation: Secondary | ICD-10-CM | POA: Diagnosis not present

## 2023-05-02 ENCOUNTER — Other Ambulatory Visit: Payer: Self-pay | Admitting: *Deleted

## 2023-05-02 NOTE — Patient Outreach (Signed)
Per [facility] Mrs. Sickman resides in [place] skilled nursing facility. Screening for potential [facility] care coordination services as benefit of health plan and Primary Care Provider.   Facility site visit to [facility] skilled nursing facility. Went to bedside to speak with Mrs. Chilton. However, she was sleeping soundly. Left [facility] Care Management brochure and writer's contact information at bedside.   [place] Air traffic controller will follow for transition plans/needs.    Raiford Noble, MSN, RN,BSN [facility] Post Acute Care Coordinator [phone] (Direct dial)

## 2023-05-03 DIAGNOSIS — R251 Tremor, unspecified: Secondary | ICD-10-CM | POA: Diagnosis not present

## 2023-05-03 DIAGNOSIS — M6281 Muscle weakness (generalized): Secondary | ICD-10-CM | POA: Diagnosis not present

## 2023-05-03 DIAGNOSIS — R2689 Other abnormalities of gait and mobility: Secondary | ICD-10-CM | POA: Diagnosis not present

## 2023-05-03 DIAGNOSIS — I480 Paroxysmal atrial fibrillation: Secondary | ICD-10-CM | POA: Diagnosis not present

## 2023-05-03 DIAGNOSIS — I5033 Acute on chronic diastolic (congestive) heart failure: Secondary | ICD-10-CM | POA: Diagnosis not present

## 2023-05-04 ENCOUNTER — Inpatient Hospital Stay: Payer: Medicare Other

## 2023-05-04 ENCOUNTER — Ambulatory Visit: Payer: Medicare Other

## 2023-05-04 ENCOUNTER — Other Ambulatory Visit: Payer: Medicare Other

## 2023-05-04 ENCOUNTER — Inpatient Hospital Stay: Payer: Medicare Other | Attending: Physician Assistant

## 2023-05-04 ENCOUNTER — Inpatient Hospital Stay: Payer: Medicare Other | Admitting: Hematology and Oncology

## 2023-05-04 ENCOUNTER — Ambulatory Visit: Payer: Medicare Other | Admitting: Hematology and Oncology

## 2023-05-04 ENCOUNTER — Other Ambulatory Visit: Payer: Self-pay | Admitting: Hematology and Oncology

## 2023-05-04 ENCOUNTER — Other Ambulatory Visit: Payer: Self-pay

## 2023-05-04 VITALS — BP 96/58 | HR 61 | Temp 98.2°F | Resp 17 | Ht 63.0 in | Wt 153.3 lb

## 2023-05-04 DIAGNOSIS — I639 Cerebral infarction, unspecified: Secondary | ICD-10-CM | POA: Diagnosis not present

## 2023-05-04 DIAGNOSIS — R059 Cough, unspecified: Secondary | ICD-10-CM | POA: Insufficient documentation

## 2023-05-04 DIAGNOSIS — C9000 Multiple myeloma not having achieved remission: Secondary | ICD-10-CM

## 2023-05-04 DIAGNOSIS — I4891 Unspecified atrial fibrillation: Secondary | ICD-10-CM | POA: Insufficient documentation

## 2023-05-04 DIAGNOSIS — D649 Anemia, unspecified: Secondary | ICD-10-CM | POA: Diagnosis not present

## 2023-05-04 DIAGNOSIS — I480 Paroxysmal atrial fibrillation: Secondary | ICD-10-CM | POA: Diagnosis not present

## 2023-05-04 DIAGNOSIS — R251 Tremor, unspecified: Secondary | ICD-10-CM | POA: Diagnosis not present

## 2023-05-04 DIAGNOSIS — G629 Polyneuropathy, unspecified: Secondary | ICD-10-CM | POA: Insufficient documentation

## 2023-05-04 DIAGNOSIS — I10 Essential (primary) hypertension: Secondary | ICD-10-CM | POA: Diagnosis not present

## 2023-05-04 DIAGNOSIS — I959 Hypotension, unspecified: Secondary | ICD-10-CM | POA: Insufficient documentation

## 2023-05-04 DIAGNOSIS — Z7901 Long term (current) use of anticoagulants: Secondary | ICD-10-CM | POA: Insufficient documentation

## 2023-05-04 DIAGNOSIS — Z5112 Encounter for antineoplastic immunotherapy: Secondary | ICD-10-CM | POA: Insufficient documentation

## 2023-05-04 DIAGNOSIS — R0789 Other chest pain: Secondary | ICD-10-CM | POA: Diagnosis not present

## 2023-05-04 LAB — CBC WITH DIFFERENTIAL (CANCER CENTER ONLY)
Abs Immature Granulocytes: 0.02 10*3/uL (ref 0.00–0.07)
Basophils Absolute: 0.1 10*3/uL (ref 0.0–0.1)
Basophils Relative: 1 %
Eosinophils Absolute: 0.2 10*3/uL (ref 0.0–0.5)
Eosinophils Relative: 3 %
HCT: 40.7 % (ref 36.0–46.0)
Hemoglobin: 13 g/dL (ref 12.0–15.0)
Immature Granulocytes: 0 %
Lymphocytes Relative: 12 %
Lymphs Abs: 0.9 10*3/uL (ref 0.7–4.0)
MCH: 27.5 pg (ref 26.0–34.0)
MCHC: 31.9 g/dL (ref 30.0–36.0)
MCV: 86 fL (ref 80.0–100.0)
Monocytes Absolute: 0.8 10*3/uL (ref 0.1–1.0)
Monocytes Relative: 11 %
Neutro Abs: 5.4 10*3/uL (ref 1.7–7.7)
Neutrophils Relative %: 73 %
Platelet Count: 150 10*3/uL (ref 150–400)
RBC: 4.73 MIL/uL (ref 3.87–5.11)
RDW: 17.2 % — ABNORMAL HIGH (ref 11.5–15.5)
WBC Count: 7.4 10*3/uL (ref 4.0–10.5)
nRBC: 0 % (ref 0.0–0.2)

## 2023-05-04 LAB — CMP (CANCER CENTER ONLY)
ALT: 16 U/L (ref 0–44)
AST: 15 U/L (ref 15–41)
Albumin: 3.1 g/dL — ABNORMAL LOW (ref 3.5–5.0)
Alkaline Phosphatase: 109 U/L (ref 38–126)
Anion gap: 9 (ref 5–15)
BUN: 17 mg/dL (ref 8–23)
CO2: 28 mmol/L (ref 22–32)
Calcium: 8.6 mg/dL — ABNORMAL LOW (ref 8.9–10.3)
Chloride: 99 mmol/L (ref 98–111)
Creatinine: 0.76 mg/dL (ref 0.44–1.00)
GFR, Estimated: >60 mL/min (ref >60)
Glucose, Bld: 115 mg/dL — ABNORMAL HIGH (ref 70–99)
Potassium: 3.8 mmol/L (ref 3.5–5.1)
Sodium: 136 mmol/L (ref 135–145)
Total Bilirubin: 0.6 mg/dL (ref 0.3–1.2)
Total Protein: 5.4 g/dL — ABNORMAL LOW (ref 6.5–8.1)

## 2023-05-04 LAB — LACTATE DEHYDROGENASE: LDH: 353 U/L — ABNORMAL HIGH (ref 98–192)

## 2023-05-04 NOTE — Progress Notes (Unsigned)
[facility] Telephone:([phone]   Fax:([phone]) [phone]   PROGRESS NOTE   Patient Care Team: [facility], [facility] as PCP - General (Rheumatology)   Hematological/Oncological History # IgG Lambda Multiple Myeloma  06/14/2022: establish care with [name] due to anemia. Labs showed M protein 4.9, Kappa 7.2, Lambda 10.8, ratio 0.67 07/13/2022: Bmbx showed Lambda restricted plasma cell neoplasm involving approximately 60% of the cellular marrow by IHC on the biopsy.  08/01/2022: Cycle 1 Day 1 of VRd chemotherapy. (Holding revlimid initially) 08/21/2022:  Cycle 2 Day 1 of VRd chemotherapy. (Holding revlimid) 09/04/2022: Cycle 3 Day 1 of VRd chemotherapy. Started revlimid 10/03/2022: Cycle 4 Day 1 of VRd chemotherapy 10/31/2022: Cycle 5 Day 1 of VRd chemotherapy 11/20/2022: Cycle 6 Day 1 of VRd chemotherapy 12/11/2022: Cycle 7 Day 1 of VRd chemotherapy 01/02/2023: Cycle 8 Day 1 of VRd chemotherapy 01/22/2023: Cycle 9 Day 1 of VRd chemotherapy 02/23/2023: Day 1 Cycle 1 of Dara/Rev/Dex 03/23/2023: Day 1 Cycle 2 of Dara/Rev/Dex 05/06/2023: chemotherapy on pause due to chest pain/cardiac/respiratory issues.     INTERVAL HISTORY: Zoe 89 y.o. female returns to the clinic today for a follow-up visit accompanied by her daughter-in-law. Her last visit was on 04/06/2023. In the interim, she continued on Dara/Rev/Dex.   Zoe [name] reports he has no chemotherapy treatments over the last 3 weeks because she is been in rehab for 2 weeks.  She reports she was released on Monday and did very well with physical therapy.  She is having some difficulty with sleeping at night however and she feels like she "is losing rest".  She notes that she will be getting a chest x-ray when she gets back to her facility today.  She notes that she would like to continue holding on treatments as she is unsure if her current chest pain and discomfort is secondary to chemotherapy as is  suspected by cardiology.  She notes that her cough has improved and she is not having any shortness of breath.  She denies any runny nose, sore throat, or cough.  She denies any fevers, chills, sweats, nausea vomiting or diarrhea.  A full 10 point ROS is otherwise negative.  After detailed discussion with the patient and her daughter we decided to hold on chemotherapy for the time being and regroup in about 2 weeks time to determine the neck steps moving forward.  MEDICAL HISTORY: Past Medical History:  Diagnosis Date   Arthritis    some - per patient   Breast cancer (HCC)    breast cancer / left    Cataract    bilat    GERD (gastroesophageal reflux disease)    History of kidney stones    Hyperlipidemia    Hypertension    Hypothyroidism    Macular degeneration    Left   S/P TAVR (transcatheter aortic valve replacement) 09/03/2018   23 mm Edwards Sapien 3 transcatheter heart valve placed via percutaneous right transfemoral approach    Severe aortic stenosis    Stress incontinence    Thyroid disease    Tinnitus     ALLERGIES:  is allergic to penicillins and sulfa antibiotics.  MEDICATIONS:  Current Outpatient Medications  Medication Sig Dispense Refill   acyclovir (ZOVIRAX) 400 MG tablet Take 1 tablet (400 mg total) by mouth 2 (two) times daily. 60 tablet 3   amiodarone (PACERONE) 200 MG tablet Take 1 tablet (200 mg total) by mouth daily. 45 tablet 1   apixaban (ELIQUIS) 5 MG  TABS tablet Take 1 tablet (5 mg total) by mouth 2 (two) times daily. 180 tablet 1   Artificial Tear Solution (SOOTHE XP) SOLN Place 1 drop into both eyes every evening.     Cholecalciferol (VITAMIN D3) 50 MCG (2000 UT) capsule Take 1 capsule (2,000 Units total) by mouth daily.     [employer] VITAMIN B12 1000 MCG tablet TAKE 1 TABLET BY MOUTH EVERY DAY 90 tablet 1   dapagliflozin propanediol (FARXIGA) 10 MG TABS tablet TAKE 1 TABLET BY MOUTH EVERY DAY 90 tablet 2   digoxin 62.5 MCG TABS Take 0.0625 mg by mouth  daily. 30 tablet 0   diltiazem (CARDIZEM CD) 120 MG 24 hr capsule Take 1 capsule (120 mg total) by mouth daily. 30 capsule 0   esomeprazole (NEXIUM) 20 MG capsule Take 20 mg by mouth daily as needed (Heartburn).     feeding supplement (ENSURE ENLIVE / ENSURE PLUS) LIQD Take 237 mLs by mouth 2 (two) times daily between meals. 14220 mL 0   levothyroxine (SYNTHROID, LEVOTHROID) 100 MCG tablet Take 100 mcg by mouth daily before breakfast.  2   metoprolol tartrate (LOPRESSOR) 50 MG tablet Take 1 tablet (50 mg total) by mouth 2 (two) times daily. 60 tablet 1   OVER THE COUNTER MEDICATION Take 1 capsule by mouth in the morning and at bedtime. AREDS     polyethylene glycol (MIRALAX / GLYCOLAX) 17 g packet Take 17 g by mouth daily as needed for mild constipation.     potassium chloride SA (KLOR-CON M) 20 MEQ tablet Take 1 tablet (20 mEq total) by mouth daily as needed (take when taking torsemide.). 30 tablet 0   pravastatin (PRAVACHOL) 40 MG tablet Take 40 mg by mouth every evening.     sennosides-docusate sodium (SENOKOT-S) 8.6-50 MG tablet Take 1 tablet by mouth daily as needed for constipation.     torsemide (DEMADEX) 20 MG tablet Take 1 tablet (20 mg total) by mouth daily as needed (increase weight 2 to 3 lbs in 24 hrs or 5 lbs in 7 days.). 30 tablet 0   No current facility-administered medications for this visit.    SURGICAL HISTORY:  Past Surgical History:  Procedure Laterality Date   ABDOMINAL HYSTERECTOMY  1970's   BACK SURGERY     BREAST LUMPECTOMY  12/1998   lumpectomy   CARDIAC CATHETERIZATION     CARDIOVERSION N/A 04/18/2023   Procedure: CARDIOVERSION;  Surgeon: Tessa Lerner, DO;  Location: [facility] INVASIVE CV LAB;  Service: Cardiovascular;  Laterality: N/A;   CARDIOVERSION N/A 04/20/2023   Procedure: CARDIOVERSION;  Surgeon: Yates Decamp, MD;  Location: [facility] INVASIVE CV LAB;  Service: Cardiovascular;  Laterality: N/A;   EYE SURGERY     cataract surgery bilat    INTRAOPERATIVE TRANSTHORACIC  ECHOCARDIOGRAM N/A 09/03/2018   Procedure: INTRAOPERATIVE TRANSTHORACIC ECHOCARDIOGRAM;  Surgeon: Kathleene Hazel, MD;  Location: [facility] OR;  Service: Open Heart Surgery;  Laterality: N/A;   KYPHOPLASTY N/A 09/07/2022   Procedure: THORACIC EIGHT KYPHOPLASTY;  Surgeon: Venita Lick, MD;  Location: [facility] OR;  Service: Orthopedics;  Laterality: N/A;  1 hr Local with IV [facility] 3 C-Bed   LITHOTRIPSY     Right total knee     2018 Dr. Charlann Boxer   RIGHT/LEFT HEART CATH AND CORONARY ANGIOGRAPHY N/A 08/06/2018   Procedure: RIGHT/LEFT HEART CATH AND CORONARY ANGIOGRAPHY;  Surgeon: Yates Decamp, MD;  Location: [facility] INVASIVE CV LAB;  Service: Cardiovascular;  Laterality: N/A;   THYROIDECTOMY, PARTIAL  1975   TONSILLECTOMY  as a child - patient not sure of exact date   TOTAL KNEE ARTHROPLASTY Left 03/13/2016   Procedure: TOTAL KNEE ARTHROPLASTY;  Surgeon: Durene Romans, MD;  Location: [facility] ORS;  Service: Orthopedics;  Laterality: Left;   TOTAL KNEE ARTHROPLASTY Right 06/18/2017   Procedure: RIGHT TOTAL KNEE ARTHROPLASTY;  Surgeon: Durene Romans, MD;  Location: [facility] ORS;  Service: Orthopedics;  Laterality: Right;   TRANSCATHETER AORTIC VALVE REPLACEMENT, TRANSFEMORAL N/A 09/03/2018   Procedure: TRANSCATHETER AORTIC VALVE REPLACEMENT, TRANSFEMORAL;  Surgeon: Kathleene Hazel, MD;  Location: [facility] OR;  Service: Open Heart Surgery;  Laterality: N/A;   REVIEW OF SYSTEMS:   Constitutional: ( - ) fevers, ( - )  chills , ( - ) night sweats Eyes: ( - ) blurriness of vision, ( - ) double vision, ( - ) watery eyes Ears, nose, mouth, throat, and face: ( - ) mucositis, ( - ) sore throat Respiratory: ( +) cough, ( - ) dyspnea, ( - ) wheezes Cardiovascular: ( - ) palpitation, ( - ) chest discomfort, ( +) lower extremity swelling Gastrointestinal:  ( - ) nausea, ( - ) heartburn, ( - ) change in bowel habits Skin: ( - ) abnormal skin rashes Lymphatics: ( - ) new lymphadenopathy, ( - ) easy bruising Neurological: ( - )  numbness, ( - ) tingling, ( - ) new weaknesses Behavioral/Psych: ( - ) mood change, ( - ) new changes  All other systems were reviewed with the patient and are negative.   PHYSICAL EXAMINATION:  Blood pressure (!) 96/58, pulse 61, temperature 98.2 F (36.8 C), temperature source Oral, resp. rate 17, height 5\' 3"  (1.6 m), weight 153 lb 4.8 oz (69.5 kg), SpO2 100%.  ECOG PERFORMANCE STATUS: 1  GENERAL: well appearing elderly Caucasian female, alert, no distress and comfortable SKIN: skin color, texture, turgor are normal, no rashes or significant lesions EYES: conjunctiva are pink and non-injected, sclera clear LUNGS:normal breathing effort. Mild wheezing and crackles in lower lobes b/l.  HEART: irregular rhythm ,normal rate and no murmurs. +1 bilateral lower extremity edema starting below the knee to ankles.  Musculoskeletal: no cyanosis of digits and no clubbing  PSYCH: alert & oriented x 3, fluent speech NEURO: no focal motor/sensory deficits    LABORATORY DATA: Lab Results  Component Value Date   WBC 7.4 05/04/2023   HGB 13.0 05/04/2023   HCT 40.7 05/04/2023   MCV 86.0 05/04/2023   PLT 150 05/04/2023      Chemistry      Component Value Date/Time   NA 136 05/04/2023 0907   NA 130 (L) 06/19/2022 1012   K 3.8 05/04/2023 0907   CL 99 05/04/2023 0907   CO2 28 05/04/2023 0907   BUN 17 05/04/2023 0907   BUN 21 06/19/2022 1012   CREATININE 0.76 05/04/2023 0907      Component Value Date/Time   CALCIUM 8.6 (L) 05/04/2023 0907   ALKPHOS 109 05/04/2023 0907   AST 15 05/04/2023 0907   ALT 16 05/04/2023 0907   BILITOT 0.6 05/04/2023 2725       RADIOGRAPHIC STUDIES:  EP STUDY  Result Date: 05/09/2023 See surgical note for result.  EP STUDY  Result Date: 04/19/2023 See surgical note for result.  ECHOCARDIOGRAM COMPLETE  Result Date: 04/16/2023    ECHOCARDIOGRAM REPORT   Patient Name:   Zoe [name] Date of Exam: 04/16/2023 Medical Rec #:  [id number]            Height:  63.0 in Accession #:    [id number]          Weight:       149.0 lb Date of Birth:  1934/01/26            BSA:          1.706 m Patient Age:    89 years            BP:           114/49 mmHg Patient Gender: F                   HR:           81 bpm. Exam Location:  Inpatient Procedure: 2D Echo, Color Doppler and Cardiac Doppler Indications:    R07.9* Chest pain, unspecified  History:        Patient has prior history of Echocardiogram examinations, most                 recent 02/17/2023. CHF, Abnormal ECG, Stroke, Aortic Valve                 Disease, Arrythmias:Atrial Fibrillation, Signs/Symptoms:Chest                 Pain; Risk Factors:Hypertension and Dyslipidemia. Breast cancer.                 Aortic stenosis.                 Aortic Valve: 23 mm 3 THV size 23 mm Sapien prosthetic, stented                 (TAVR) valve is present in the aortic position. Procedure Date:                 09/03/2018.  Sonographer:    Sheralyn Boatman RDCS Referring Phys: [id number] ANASTASSIA DOUTOVA IMPRESSIONS  1. Left ventricular ejection fraction, by estimation, is 60 to 65%. The left ventricle has normal function. The left ventricle has no regional wall motion abnormalities. There is moderate concentric left ventricular hypertrophy. Left ventricular diastolic parameters are consistent with Grade II diastolic dysfunction (pseudonormalization). Elevated left ventricular end-diastolic pressure.  2. Right ventricular systolic function is normal. The right ventricular size is normal. There is mildly elevated pulmonary artery systolic pressure. The estimated right ventricular systolic pressure is 41.4 mmHg.  3. Left atrial size was moderately dilated.  4. The mitral valve is normal in structure. Mild mitral valve regurgitation.  5. The aortic valve has been repaired/replaced. Aortic valve regurgitation is mild. No aortic stenosis is present. There is a 23 mm 3 THV size 23 mm Sapien prosthetic (TAVR) valve present in the aortic position.  Procedure Date: 09/03/2018.  6. The inferior vena cava is normal in size with <50% respiratory variability, suggesting right atrial pressure of 8 mmHg. Comparison(s): A prior study was performed on 02/17/2023. No significant change from prior study. FINDINGS  Left Ventricle: Left ventricular ejection fraction, by estimation, is 60 to 65%. The left ventricle has normal function. The left ventricle has no regional wall motion abnormalities. The left ventricular internal cavity size was normal in size. There is  moderate concentric left ventricular hypertrophy. Left ventricular diastolic parameters are consistent with Grade II diastolic dysfunction (pseudonormalization). Elevated left ventricular end-diastolic pressure. Right Ventricle: The right ventricular size is normal. No increase in right ventricular wall thickness. Right ventricular systolic function is normal. There is mildly elevated pulmonary artery systolic pressure. The tricuspid  regurgitant velocity is 2.89  m/s, and with an assumed right atrial pressure of 8 mmHg, the estimated right ventricular systolic pressure is 41.4 mmHg. Left Atrium: Left atrial size was moderately dilated. Right Atrium: Right atrial size was normal in size. Pericardium: There is no evidence of pericardial effusion. Mitral Valve: The mitral valve is normal in structure. There is mild calcification of the mitral valve leaflet(s). Mild mitral annular calcification. Mild mitral valve regurgitation. MV peak gradient, 12.4 mmHg. The mean mitral valve gradient is 4.0 mmHg. Tricuspid Valve: The tricuspid valve is normal in structure. Tricuspid valve regurgitation is mild . No evidence of tricuspid stenosis. Aortic Valve: The aortic valve has been repaired/replaced. Aortic valve regurgitation is mild. No aortic stenosis is present. Aortic valve mean gradient measures 17.0 mmHg. Aortic valve peak gradient measures 29.5 mmHg. Aortic valve area, by VTI measures  1.84 cm. There is a 23 mm 3  THV size 23 mm Sapien prosthetic, stented (TAVR) valve present in the aortic position. Procedure Date: 09/03/2018. Pulmonic Valve: The pulmonic valve was normal in structure. Pulmonic valve regurgitation is trivial. No evidence of pulmonic stenosis. Aorta: The aortic root is normal in size and structure. Venous: The inferior vena cava is normal in size with less than 50% respiratory variability, suggesting right atrial pressure of 8 mmHg. IAS/Shunts: No atrial level shunt detected by color flow Doppler.  LEFT VENTRICLE PLAX 2D LVIDd:         3.50 cm   Diastology LVIDs:         2.40 cm   LV e' medial:    4.90 cm/s LV PW:         1.60 cm   LV E/e' medial:  32.7 LV IVS:        1.80 cm   LV e' lateral:   5.87 cm/s LVOT diam:     2.20 cm   LV E/e' lateral: 27.3 LV SV:         102 LV SV Index:   60 LVOT Area:     3.80 cm  RIGHT VENTRICLE             IVC RV S prime:     12.00 cm/s  IVC diam: 1.70 cm TAPSE (M-mode): 1.6 cm LEFT ATRIUM             Index        RIGHT ATRIUM           Index LA diam:        4.90 cm 2.87 cm/m   RA Area:     15.50 cm LA Vol (A2C):   17.3 ml 10.14 ml/m  RA Volume:   36.00 ml  21.10 ml/m LA Vol (A4C):   68.1 ml 39.91 ml/m LA Biplane Vol: 37.0 ml 21.68 ml/m  AORTIC VALVE                     PULMONIC VALVE AV Area (Vmax):    1.75 cm      PR End Diast Vel: 1.94 msec AV Area (Vmean):   1.79 cm AV Area (VTI):     1.84 cm AV Vmax:           271.75 cm/s AV Vmean:          183.000 cm/s AV VTI:            0.554 m AV Peak Grad:      29.5 mmHg AV Mean Grad:  17.0 mmHg LVOT Vmax:         125.00 cm/s LVOT Vmean:        86.200 cm/s LVOT VTI:          0.268 m LVOT/AV VTI ratio: 0.48  AORTA Ao Root diam: 2.70 cm Ao Asc diam:  3.10 cm MITRAL VALVE                TRICUSPID VALVE MV Area (PHT): 3.17 cm     TR Peak grad:   33.4 mmHg MV Area VTI:   2.33 cm     TR Vmax:        289.00 cm/s MV Peak grad:  12.4 mmHg MV Mean grad:  4.0 mmHg     SHUNTS MV Vmax:       1.76 m/s     Systemic VTI:  0.27 m MV  Vmean:      93.9 cm/s    Systemic Diam: 2.20 cm MV Decel Time: 239 msec MV E velocity: 160.00 cm/s MV A velocity: 114.00 cm/s MV E/A ratio:  1.40 Yates Decamp MD Electronically signed by Yates Decamp MD Signature Date/Time: 04/16/2023/10:07:14 PM    Final    DG HIPS BILAT WITH PELVIS 2V  Result Date: 04/16/2023 CLINICAL DATA:  Status post fall 5 days ago with subsequent right hip pain. EXAM: DG HIP (WITH OR WITHOUT PELVIS) 2V BILAT COMPARISON:  None Available. FINDINGS: There is no evidence of hip fracture or dislocation. Moderate to marked severity degenerative changes are seen involving both hips, in the form of joint space narrowing and acetabular sclerosis. IMPRESSION: Degenerative changes involving both hips, without evidence of an acute osseous abnormality. Electronically Signed   By: Aram Candela M.D.   On: 04/16/2023 01:06   DG Chest Portable 1 View  Result Date: 04/15/2023 CLINICAL DATA:  Atrial fibrillation EXAM: PORTABLE CHEST 1 VIEW COMPARISON:  04/13/2023 FINDINGS: Stable cardiomediastinal silhouette. TAVR. Aortic atherosclerotic calcification. Bibasilar atelectasis. No pleural effusion or pneumothorax. IMPRESSION: Stable exam since 04/13/2023.  Bibasilar atelectasis. Electronically Signed   By: Minerva Fester M.D.   On: 04/15/2023 19:04   CT Head Wo Contrast  Result Date: 04/13/2023 CLINICAL DATA:  Head trauma.  Status post fall. EXAM: CT HEAD WITHOUT CONTRAST TECHNIQUE: Contiguous axial images were obtained from the base of the skull through the vertex without intravenous contrast. RADIATION DOSE REDUCTION: This exam was performed according to the departmental dose-optimization program which includes automated exposure control, adjustment of the mA and/or kV according to patient size and/or use of iterative reconstruction technique. COMPARISON:  MRI 03/13/2023 FINDINGS: Brain: No evidence of acute infarction, hemorrhage, hydrocephalus, extra-axial collection or mass lesion/mass effect.  There is patchy low-attenuation within the subcortical and periventricular white matter compatible with chronic microvascular disease. Vascular: No hyperdense vessel or unexpected calcification. Skull: Normal. Negative for fracture or focal lesion. Sinuses/Orbits: Opacification of the sphenoid sinus. Postsurgical changes from ethmoidectomy. Bilateral median antrectomy. Mastoid air cells are clear. Other: No significant scalp hematoma. IMPRESSION: 1. No acute intracranial abnormalities. 2. Chronic microvascular disease 3. Chronic sinus disease. Electronically Signed   By: Signa Kell M.D.   On: 04/13/2023 11:43   DG Chest 2 View  Result Date: 04/13/2023 CLINICAL DATA:  Chest pain.  Multiple myeloma. EXAM: CHEST - 2 VIEW COMPARISON:  Radiographs 03/23/2023 and 02/17/2023. FINDINGS: Stable cardiomegaly and aortic atherosclerosis post TAVR procedure. There is mild atelectasis or scarring at both lung bases which appears unchanged. Ill-defined density projecting over the right lung apex on the  frontal examination has no definite correlate on the lateral view and may relate to overlying soft tissues. No focal airspace disease, pneumothorax or significant pleural effusion identified. The bones appear unchanged status post 2 level spinal augmentation. Telemetry leads overlie the chest. IMPRESSION: No definite acute cardiopulmonary process identified. Stable cardiomegaly and mild bibasilar atelectasis or scarring. Electronically Signed   By: Carey Bullocks M.D.   On: 04/13/2023 10:25     ASSESSMENT/PLAN:  MARIAELENA KASSMAN is a 89 y.o. female who presents to the clinic for evaluation for IgG lambda multiple myeloma.   # IgG Lambda Multiple Myeloma  --diagnosis of MM confirmed with bone marrow biopsy showing 60% plasma cells and anemia -- Bone survey shows no lytic lesions, kidney function is within normal limits.  --08/01/2022 was Cycle 1 Day 1 of VRd  -Dr. Leonides Schanz discontinued velcade on 02/05/23 due to  rash. She is status post day 15 cycle #9.  -Dr. Leonides Schanz changed the care plan to Daratumumab, Revlimid, and decadron. She is scheduled for day 1 cycle #1 today  -Started Cycle 1, Day 1 of Dara/Rev/Dex on 02/23/2023.  Plan:  --Due for Cycle 2, Day 22 of Dara/Rev/Dex today, however will HOLD due to chest pain and concerned that it may be related to her chemotherapy treatments. --Labs show white blood cell count 7.4, hemoglobin 13.0, MCV 86, and platelets of 150 --last M protein from 02/05/2023 trended down to 0.3 (4.9 prior to start of therapy). Normalized SFLC.  --RTC in 2 weeks with interval weekly treatment.   #Cough: --Chest xray from 03/23/2023 was negative for infectious process.  --Currently taking tessalon perles 100 mg TID with some improvement. --No fever and cough is mainly dry with occasional clear sputum. Cough worsens in supine position. --Encouraged to take mucinex and clairitin.  Patient scheduled for an upcoming appointment with pulmonology.  # Vaginal itching: --Sent prescription for nystatin powder    #Hypotension/BP/Atrial Fibrillation  --Follows with cardiology, Dr. Jacinto Halim -- Recommendations include metoprolol succinate 25 mg PO daily and restarted patient back on amiodarone 200 mg PO daily x 10 days followed by 12 mg daily.  --Continue on Eliquis 5 mg BID   #Bilateral lower extremity edema:  --Continue to wear compression stockings.    # Normocytic Anemia #Vitamin B12 Deficiency --Anemia likely driven by multiple myeloma, but may be component of vitamin B12 deficiency as well. --initial labs showed elevated MMA with B12 180 -- Continue vitamin B12 1000 mcg p.o. daily -- Continue to monitor   #Left leg/thigh neuropathy--improving: --Currently on gabapentin 900 mg nightly and 600 mg in the morning --Encouraged to try water aerobics and stretches.  --Following with Dr. Debbe Bales (ortho) to see if she is a candidate for steroid injections.  -- Patient evaluated by Dr.  Barbaraann Cao thinks that this may be neuropathy worsened by her Velcade therapy.   #Episode of right UE tremor and speech abnormality: --Evaluated by Dr. Barbaraann Cao on 03/20/2023. Presumed etiology had been atrial fibrillation, but vascular disease, atheromatous changes, are also possibly implicated concurrently.  --MRI brain from 03/13/2023 show no acute finding including no evidence of intracranial neoplastic disease. There are chronic small-vessel ischemic changes of the pons, cerebral hemispheric white matter and right cerebellum. --Educated patient to seek immediate evaluate if there are repeat episodes. --No neurological deficits identified per exam today    #Supportive Care -- chemotherapy education complete.  -- port placement not required.  --Awaiting to start Zometa/Xgeva  -- zofran 8mg  q8H PRN and compazine 10mg  PO q6H for  nausea -- acyclovir 400mg  PO BID for VCZ prophylaxis -- tylenol 1000 mg q8H PRN for back pain.   No orders of the defined types were placed in this encounter.   Patient expressed understanding of the plan provided.   I have spent a total of 30 minutes minutes of face-to-face and non-face-to-face time, preparing to see the patient, performing a medically appropriate examination, counseling and educating the patient, ordering tests/procedures, documenting clinical information in the electronic health record, and care coordination.   Ulysees Barns, MD Department of Hematology/Oncology [facility] at [facility] Phone: [phone] Pager: [phone] Email: [email or web address]

## 2023-05-06 ENCOUNTER — Encounter: Payer: Self-pay | Admitting: Hematology and Oncology

## 2023-05-06 DIAGNOSIS — I5033 Acute on chronic diastolic (congestive) heart failure: Secondary | ICD-10-CM | POA: Diagnosis not present

## 2023-05-06 DIAGNOSIS — E559 Vitamin D deficiency, unspecified: Secondary | ICD-10-CM | POA: Diagnosis not present

## 2023-05-06 DIAGNOSIS — I480 Paroxysmal atrial fibrillation: Secondary | ICD-10-CM | POA: Diagnosis not present

## 2023-05-07 LAB — KAPPA/LAMBDA LIGHT CHAINS
Kappa free light chain: 8.6 mg/L (ref 3.3–19.4)
Kappa, lambda light chain ratio: 1.18 (ref 0.26–1.65)
Lambda free light chains: 7.3 mg/L (ref 5.7–26.3)

## 2023-05-08 ENCOUNTER — Other Ambulatory Visit: Payer: Self-pay | Admitting: *Deleted

## 2023-05-08 DIAGNOSIS — I480 Paroxysmal atrial fibrillation: Secondary | ICD-10-CM | POA: Diagnosis not present

## 2023-05-08 DIAGNOSIS — K5900 Constipation, unspecified: Secondary | ICD-10-CM | POA: Diagnosis not present

## 2023-05-08 DIAGNOSIS — R5381 Other malaise: Secondary | ICD-10-CM | POA: Diagnosis not present

## 2023-05-08 DIAGNOSIS — E559 Vitamin D deficiency, unspecified: Secondary | ICD-10-CM | POA: Diagnosis not present

## 2023-05-08 DIAGNOSIS — E7800 Pure hypercholesterolemia, unspecified: Secondary | ICD-10-CM | POA: Diagnosis not present

## 2023-05-08 DIAGNOSIS — J988 Other specified respiratory disorders: Secondary | ICD-10-CM | POA: Diagnosis not present

## 2023-05-08 DIAGNOSIS — E039 Hypothyroidism, unspecified: Secondary | ICD-10-CM | POA: Diagnosis not present

## 2023-05-08 DIAGNOSIS — Z09 Encounter for follow-up examination after completed treatment for conditions other than malignant neoplasm: Secondary | ICD-10-CM | POA: Diagnosis not present

## 2023-05-08 DIAGNOSIS — H6120 Impacted cerumen, unspecified ear: Secondary | ICD-10-CM | POA: Diagnosis not present

## 2023-05-08 DIAGNOSIS — I5032 Chronic diastolic (congestive) heart failure: Secondary | ICD-10-CM | POA: Diagnosis not present

## 2023-05-08 DIAGNOSIS — I10 Essential (primary) hypertension: Secondary | ICD-10-CM | POA: Diagnosis not present

## 2023-05-08 NOTE — Patient Outreach (Addendum)
Per [facility] Zoe [name] discharged from [facility] skilled nursing facility on 05/07/23. Screening for potential care coordination services as benefit of health plan and  Primary Care Provider.  Telephone call made to Zoe [name] [phone] to discuss care coordination follow up. No answer. HIPAA compliant voicemail message left to request return call.  Secure message sent to [employer] social worker to inquire about home health arrangements.  Addendum: Zoe [name] social worker reports [facility] was arranged.   Raiford Noble, MSN, RN,BSN [facility] Post Acute Care Coordinator [phone] (Direct dial)

## 2023-05-09 ENCOUNTER — Encounter: Payer: Self-pay | Admitting: Cardiology

## 2023-05-09 ENCOUNTER — Telehealth: Payer: Self-pay | Admitting: Hematology and Oncology

## 2023-05-09 ENCOUNTER — Ambulatory Visit: Payer: Medicare Other | Admitting: Cardiology

## 2023-05-09 ENCOUNTER — Other Ambulatory Visit: Payer: Self-pay | Admitting: Hematology and Oncology

## 2023-05-09 VITALS — BP 140/70 | HR 77 | Resp 16 | Ht 63.0 in | Wt 153.0 lb

## 2023-05-09 DIAGNOSIS — I10 Essential (primary) hypertension: Secondary | ICD-10-CM | POA: Diagnosis not present

## 2023-05-09 DIAGNOSIS — I5032 Chronic diastolic (congestive) heart failure: Secondary | ICD-10-CM | POA: Diagnosis not present

## 2023-05-09 DIAGNOSIS — I480 Paroxysmal atrial fibrillation: Secondary | ICD-10-CM | POA: Diagnosis not present

## 2023-05-09 LAB — MULTIPLE MYELOMA PANEL, SERUM
Albumin SerPl Elph-Mcnc: 2.7 g/dL — ABNORMAL LOW (ref 2.9–4.4)
Albumin/Glob SerPl: 1.3 (ref 0.7–1.7)
Alpha 1: 0.4 g/dL (ref 0.0–0.4)
Alpha2 Glob SerPl Elph-Mcnc: 0.6 g/dL (ref 0.4–1.0)
B-Globulin SerPl Elph-Mcnc: 0.8 g/dL (ref 0.7–1.3)
Gamma Glob SerPl Elph-Mcnc: 0.3 g/dL — ABNORMAL LOW (ref 0.4–1.8)
Globulin, Total: 2.2 g/dL (ref 2.2–3.9)
IgA: 109 mg/dL (ref 64–422)
IgG (Immunoglobin G), Serum: 491 mg/dL — ABNORMAL LOW (ref 586–1602)
IgM (Immunoglobulin M), Srm: 36 mg/dL (ref 26–217)
M Protein SerPl Elph-Mcnc: 0.2 g/dL — ABNORMAL HIGH
Total Protein ELP: 4.9 g/dL — ABNORMAL LOW (ref 6.0–8.5)

## 2023-05-09 NOTE — Progress Notes (Signed)
Primary Physician:  Irena Reichmann, DO   Patient ID: Zoe [name], female    DOB: 1934-07-17, 89 y.o.   MRN: [id number]  Subjective:    Chief Complaint  Patient presents with   Post cath   Paroxysmal atrial fibrillation    HPI: Zoe [name]  is a 89 y.o. Caucasian female with history of possibly mild coronary spasm by coronary angiogram in 2014,  severe AS s/p TAVR by Dr. Cornelius Moras on 09/03/2018, asymptomatic bilateral carotid artery stenosis,  essential hypertension, hyperlipidemia, hyperglycemia and GERD.   Patient has had 2 hospitalizations 1 on 03/01/2023 with A-fib with RVR and acute diastolic heart failure and the second episode on 04/15/2023 and discharged on 04/24/2023 to rehab, again with A-fib with RVR, cardioversion temporarily lasted several hours only.  She now presents for follow-up.  Except for sharp pain in the left side of her chest which was felt to be musculoskeletal during hospitalization she has no other specific complaints.  She did have a fall and since then has been complaining about right leg pain as well.  No dyspnea, no PND or orthopnea or leg edema.  Past Medical History:  Diagnosis Date   Arthritis    some - per patient   Breast cancer (HCC)    breast cancer / left    Cataract    bilat    GERD (gastroesophageal reflux disease)    History of kidney stones    Hyperlipidemia    Hypertension    Hypothyroidism    Macular degeneration    Left   S/P TAVR (transcatheter aortic valve replacement) 09/03/2018   23 mm Edwards Sapien 3 transcatheter heart valve placed via percutaneous right transfemoral approach    Severe aortic stenosis    Stress incontinence    Thyroid disease    Tinnitus    Social History   Tobacco Use   Smoking status: Never   Smokeless tobacco: Never  Substance Use Topics   Alcohol use: No     Review of Systems  Cardiovascular:  Negative for chest pain, dyspnea on exertion and leg swelling.   Objective:  Blood pressure  (!) 140/70, pulse 77, resp. rate 16, height 5\' 3"  (1.6 m), weight 153 lb (69.4 kg), SpO2 97%. Body mass index is 27.1 kg/m.     05/09/2023    2:53 PM 05/04/2023    9:32 AM 04/24/2023   12:28 PM  Vitals with BMI  Height 5\' 3"  5\' 3"    Weight 153 lbs 153 lbs 5 oz   BMI 27.11 27.16   Systolic 140 96 151  Diastolic 70 58 58  Pulse 77 61       Physical Exam Neck:     Vascular: No carotid bruit or JVD.  Cardiovascular:     Rate and Rhythm: Normal rate and regular rhythm.     Pulses: Intact distal pulses.     Heart sounds: Normal heart sounds. No murmur heard.    No gallop.  Pulmonary:     Effort: Pulmonary effort is normal.     Breath sounds: Normal breath sounds.  Abdominal:     General: Bowel sounds are normal.     Palpations: Abdomen is soft.  Musculoskeletal:     Right lower leg: No edema.     Left lower leg: No edema.     Laboratory examination:   Lab Results  Component Value Date   NA 136 05/04/2023   K 3.8 05/04/2023   CO2 28 05/04/2023  GLUCOSE 115 (H) 05/04/2023   BUN 17 05/04/2023   CREATININE 0.76 05/04/2023   CALCIUM 8.6 (L) 05/04/2023   EGFR 59 (L) 06/19/2022   GFRNONAA >60 05/04/2023       Latest Ref Rng & Units 05/04/2023    9:07 AM 04/24/2023    1:19 AM 04/22/2023    2:09 AM  CMP  Glucose 70 - 99 mg/dL 604  540  981   BUN 8 - 23 mg/dL 17  32  28   Creatinine 0.44 - 1.00 mg/dL 1.91  4.78  2.95   Sodium 135 - 145 mmol/L 136  133  136   Potassium 3.5 - 5.1 mmol/L 3.8  3.2  3.9   Chloride 98 - 111 mmol/L 99  98  94   CO2 22 - 32 mmol/L 28  26  25    Calcium 8.9 - 10.3 mg/dL 8.6  8.1  8.4   Total Protein 6.5 - 8.1 g/dL 5.4     Total Bilirubin 0.3 - 1.2 mg/dL 0.6     Alkaline Phos 38 - 126 U/L 109     AST 15 - 41 U/L 15     ALT 0 - 44 U/L 16         Latest Ref Rng & Units 05/04/2023    9:07 AM 04/20/2023    1:21 AM 04/19/2023    1:56 AM  CBC  WBC 4.0 - 10.5 K/uL 7.4  6.4  8.4   Hemoglobin 12.0 - 15.0 g/dL 62.1  30.8  65.7   Hematocrit 36.0 - 46.0  % 40.7  38.1  37.0   Platelets 150 - 400 K/uL 150  158  176    Lab Results  Component Value Date   CHOL 174 03/30/2023   HDL 59 03/30/2023   LDLCALC 100 (H) 03/30/2023   LDLDIRECT 48 07/16/2019   TRIG 76 03/30/2023   CHOLHDL 2.9 03/30/2023   HEMOGLOBIN A1C Lab Results  Component Value Date   HGBA1C 5.2 03/30/2023   MPG 102.54 03/30/2023   TSH Recent Labs    02/17/23 0922 04/15/23 2145  TSH 1.499 0.612   BNP (last 3 results) Recent Labs    06/19/22 1012 02/17/23 0922 04/13/23 0950  BNP 176.8* 454.3* 266.5*   External labs  Labs 07/10/2022:  TSH 0.503, normal.  Serum osmolality 287, urinary osmolality 90  Total cholesterol 113, triglycerides 100, HDL 43, LDL 50.   Radiology:   Chest x-ray 03/23/2023: Cardiomegaly and aortic valve replacement again noted.   Mild bibasilar atelectasis/scarring again noted.  There is no evidence of focal airspace disease, pulmonary edema, suspicious pulmonary nodule/mass, pleural effusion, or pneumothorax. No acute bony abnormalities are identified. Vertebral compression fractures and augmentation again noted.  Chest x-ray 04/15/2023: Stable cardiomediastinal silhouette. TAVR. Aortic atherosclerotic calcification. Bibasilar atelectasis. No pleural effusion or pneumothorax.  Cardiac Studies:    Coronary angiogram 08/06/2018: normal coronary arteries. Moderate pulmonary hypertension due to AS with elevated PW. Preserved CO and CI.  TAVR 09/03/2018: Edwards Sapien 3 THV (size 23 mm, model # 9600TFX, serial # [id number]) by Ashley Mariner and Earney Hamburg.  Carotid artery duplex 09/21/2022:  Duplex suggests stenosis in the right internal carotid artery (1-15%).  Duplex suggests stenosis in the left internal carotid artery (16-49%).  Antegrade right vertebral artery flow. Antegrade left vertebral artery flow.  Compared to the study done on 02/15/2022, left ICA stenosis of >50% has now regressed.  Otherwise no significant change.  There is  mild to moderate heterogenous  plaque noted in the bilateral carotid arteries. Follow up in one year is appropriate if clinically indicated.   Echocardiogram 04/16/2023  1. Left ventricular ejection fraction, by estimation, is 60 to 65%. The left ventricle has normal function. The left ventricle has no regional wall motion abnormalities. There is moderate concentric left ventricular hypertrophy. Left ventricular  diastolic parameters are consistent with Grade II diastolic dysfunction (pseudonormalization). Elevated left ventricular end-diastolic pressure.  2. Right ventricular systolic function is normal. The right ventricular size is normal. There is mildly elevated pulmonary artery systolic pressure. The estimated right ventricular systolic pressure is 41.4 mmHg.  3. Left atrial size was moderately dilated.  4. The mitral valve is normal in structure. Mild mitral valve regurgitation.  5. The aortic valve has been repaired/replaced. Aortic valve regurgitation is mild. No aortic stenosis is present. There is a 23 mm 3 THV size 23 mm Sapien prosthetic (TAVR) valve present in the aortic position. Procedure Date: 09/03/2018.  6. The inferior vena cava is normal in size with <50% respiratory variability, suggesting right atrial pressure of 8 mmHg.   Comparison(s): A prior study was performed on 02/17/2023. No significant change from prior study.  EKG   EKG 05/09/2023: Sinus rhythm with first-degree AV block at the rate of 77 bpm, normal axis, incomplete right bundle branch block.  No evidence of ischemia.  Compared to 03/29/2023, first-degree AV block new.  Telemetry 04/19/2023: Atrial flutter with 2: 1 conduction at the rate of 135 bpm.   EKG 03/29/2023: Normal sinus rhythm at the rate of 77 bpm, leftward enlargement, otherwise normal EKG.  Compared to 03/01/2023, atrial fibrillation no longer present.  Allergies and Medications:   Allergies  Allergen Reactions   Quinolones Other (See Comments)     Patient on Amiodarone and can prolong QT   Penicillins Other (See Comments)    UNSPECIFIED REACTION  Patient does not remember reaction.  Has patient had a PCN reaction causing immediate rash, facial/tongue/throat swelling, SOB or lightheadedness with hypotension: no Has patient had a PCN reaction causing severe rash involving mucus membranes or skin necrosis: no Has patient had a PCN reaction that required hospitalization no Has patient had a PCN reaction occurring within the last 10 years: no If all of the above answers are "NO", then may proceed with Cephalosporin use.    Sulfa Antibiotics Other (See Comments)    UNSPECIFIED REACTION  "maybe vision issues? "    Current Outpatient Medications:    acyclovir (ZOVIRAX) 400 MG tablet, Take 1 tablet (400 mg total) by mouth 2 (two) times daily., Disp: 60 tablet, Rfl: 3   amiodarone (PACERONE) 200 MG tablet, Take 1 tablet (200 mg total) by mouth daily., Disp: 45 tablet, Rfl: 1   apixaban (ELIQUIS) 5 MG TABS tablet, Take 1 tablet (5 mg total) by mouth 2 (two) times daily., Disp: 180 tablet, Rfl: 1   Artificial Tear Solution (SOOTHE XP) SOLN, Place 1 drop into both eyes every evening., Disp: , Rfl:    Cholecalciferol (VITAMIN D3) 50 MCG (2000 UT) capsule, Take 1 capsule (2,000 Units total) by mouth daily., Disp: , Rfl:    CVS VITAMIN B12 1000 MCG tablet, TAKE 1 TABLET BY MOUTH EVERY DAY, Disp: 90 tablet, Rfl: 1   dapagliflozin propanediol (FARXIGA) 10 MG TABS tablet, TAKE 1 TABLET BY MOUTH EVERY DAY, Disp: 90 tablet, Rfl: 2   diltiazem (CARDIZEM CD) 120 MG 24 hr capsule, Take 1 capsule (120 mg total) by mouth daily., Disp: 30 capsule, Rfl: 0  esomeprazole (NEXIUM) 20 MG capsule, Take 20 mg by mouth daily as needed (Heartburn)., Disp: , Rfl:    feeding supplement (ENSURE ENLIVE / ENSURE PLUS) LIQD, Take 237 mLs by mouth 2 (two) times daily between meals., Disp: 14220 mL, Rfl: 0   levofloxacin (LEVAQUIN) 750 MG tablet, Take 750 mg by mouth daily.,  Disp: , Rfl:    levothyroxine (SYNTHROID, LEVOTHROID) 100 MCG tablet, Take 100 mcg by mouth daily before breakfast., Disp: , Rfl: 2   metoprolol tartrate (LOPRESSOR) 50 MG tablet, Take 1 tablet (50 mg total) by mouth 2 (two) times daily., Disp: 60 tablet, Rfl: 1   OVER THE COUNTER MEDICATION, Take 1 capsule by mouth in the morning and at bedtime. AREDS, Disp: , Rfl:    polyethylene glycol (MIRALAX / GLYCOLAX) 17 g packet, Take 17 g by mouth daily as needed for mild constipation., Disp: , Rfl:    potassium chloride SA (KLOR-CON M) 20 MEQ tablet, Take 1 tablet (20 mEq total) by mouth daily as needed (take when taking torsemide.)., Disp: 30 tablet, Rfl: 0   pravastatin (PRAVACHOL) 40 MG tablet, Take 40 mg by mouth every evening., Disp: , Rfl:    sennosides-docusate sodium (SENOKOT-S) 8.6-50 MG tablet, Take 1 tablet by mouth daily as needed for constipation., Disp: , Rfl:    furosemide (LASIX) 40 MG tablet, Take 1 tablet (40 mg total) by mouth daily as needed for fluid., Disp: , Rfl:      Assessment:     ICD-10-CM   1. Paroxysmal atrial fibrillation (HCC)  I48.0 EKG 12-Lead    2. Chronic diastolic (congestive) heart failure (HCC)  I50.32 furosemide (LASIX) 40 MG tablet    3. Primary hypertension  I10      No orders of the defined types were placed in this encounter.   Medications Discontinued During This Encounter  Medication Reason   torsemide (DEMADEX) 20 MG tablet    digoxin 62.5 MCG TABS Discontinued by provider     Recommendations:   Zoe [name] is a fairly active 89 y.o. Caucasian female with history of possibly mild coronary spasm by coronary angiogram in 2014,  severe AS s/p TAVR by Dr. Cornelius Moras on 09/03/2018, asymptomatic bilateral carotid artery stenosis,  essential hypertension, hyperlipidemia, hyperglycemia and GERD.   Patient has had 2 hospitalizations 1 on 03/01/2023 with A-fib with RVR and acute diastolic heart failure and the second episode on 04/15/2023 and discharged  on 04/24/2023 to rehab, again with A-fib with RVR, cardioversion temporarily lasted several hours only.  She now presents for follow-up.   1. Paroxysmal atrial fibrillation (HCC) Patient was discharged with persistent atrial fibrillation however has now resolved and she is back in sinus rhythm.  I will discontinue digoxin.  She will continue with amiodarone and also metoprolol.  Patient will confirm the dosage of the metoprolol, prior to hospital admission she was on metoprolol succinate 25 mg daily but was discharged on metoprolol to tartrate 50 mg p.o. twice daily.  Patient was started on Levaquin for "pneumonia" about 10 days ago, she has 2 more doses of Levaquin left.  Fortunately QTc has not prolonged.  Would avoid quinolones in view of patient being on amiodarone for future.  I have placed quinolones as allergy/contraindication in her medication profile. - EKG 12-Lead  2. Chronic diastolic (congestive) heart failure (HCC) Patient presently not taking furosemide, states that she has not needed any as she has not had worsening dyspnea or leg edema.  She was also confused about her  medications as she was discharged to rehab on torsemide. - furosemide (LASIX) 40 MG tablet; Take 1 tablet (40 mg total) by mouth daily as needed for fluid.  3. Primary hypertension Blood pressure is well-controlled.  I will see her back in 3 months for follow-up.  Other orders - levofloxacin (LEVAQUIN) 750 MG tablet; Take 750 mg by mouth daily.      Yates Decamp, MD, [facility] 05/10/2023, 6:53 AM Office: [phone] Fax: [phone] Pager: [phone]

## 2023-05-10 ENCOUNTER — Encounter (HOSPITAL_BASED_OUTPATIENT_CLINIC_OR_DEPARTMENT_OTHER): Payer: Self-pay | Admitting: Pulmonary Disease

## 2023-05-10 ENCOUNTER — Ambulatory Visit (HOSPITAL_BASED_OUTPATIENT_CLINIC_OR_DEPARTMENT_OTHER): Payer: Medicare Other | Admitting: Pulmonary Disease

## 2023-05-10 VITALS — BP 144/78 | HR 80 | Temp 97.8°F | Ht 63.0 in | Wt 153.0 lb

## 2023-05-10 DIAGNOSIS — R052 Subacute cough: Secondary | ICD-10-CM

## 2023-05-10 DIAGNOSIS — Z9981 Dependence on supplemental oxygen: Secondary | ICD-10-CM | POA: Diagnosis not present

## 2023-05-10 MED ORDER — ALBUTEROL SULFATE HFA 108 (90 BASE) MCG/ACT IN AERS: 2.0000 | INHALATION_SPRAY | Freq: Four times a day (QID) | RESPIRATORY_TRACT | 2 refills | Status: DC | PRN

## 2023-05-10 NOTE — Progress Notes (Unsigned)
Subjective:   PATIENT ID: Zoe [name] GENDER: female DOB: 1934/09/22, MRN: [id number]  Chief Complaint  Patient presents with   Consult    Cough for 6 weeks, pt states she does feel better. Does occ feel left side chest pain.     Reason for Visit: New consult for cough  [name] is 89 year old female never smoker with HTN, HLD, multiple myeloma s/p chemotherapy, hypothyroidism and atrial fibrillation, AS s/p TAVR 2019 and GERD who presents for evaluation for subacute cough.  Presents with friend/caregiver  She reports 6-8 weeks of chronic cough. She feels her deep cough and phelgm have improved and nearly resolved. She is still fighting a hacky cough with rare sputum production. Denies wheezing. Denies chronic respiratory issues and no frequent infections. However she reports wearing oxygen left from her husband after being hospitalized in July which significantly helped with her fatigue. She was hospitalized from 6/23-04/24/23 for atrial fibrillation requiring cardioversion. Discharged summary reviewed. Not discharged on oxygen per note.  She was started on chemotherapy from 07/2022 - 04/2023. Discontinued due to chest pain/cardiac/respiratory issues per Oncology.   Social History: Lives with grandson. 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  Past Medical History:  Diagnosis Date   Arthritis    some - per patient   Breast cancer (HCC)    breast cancer / left    Cataract    bilat    GERD (gastroesophageal reflux disease)    History of kidney stones    Hyperlipidemia    Hypertension    Hypothyroidism    Macular degeneration    Left   S/P TAVR (transcatheter aortic valve replacement) 09/03/2018   23 mm Edwards Sapien 3 transcatheter heart valve placed via percutaneous right transfemoral approach    Severe aortic stenosis     Stress incontinence    Thyroid disease    Tinnitus      Family History  Problem Relation Age of Onset   Diabetes Mother    Stroke Mother        Carotid artery disease   Heart disease Father        CAD   Coronary artery disease Father    Diabetes Sister      Social History   Occupational History   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  Has patient had a PCN reaction causing immediate rash, facial/tongue/throat swelling, SOB or lightheadedness with hypotension: no Has patient had a PCN reaction causing severe rash involving mucus membranes or skin necrosis: no Has patient had a PCN reaction that required hospitalization no Has patient had a PCN reaction occurring within the last 10 years: no If all of the above answers are "NO", then may proceed with Cephalosporin use.    Sulfa Antibiotics Other (See Comments)    UNSPECIFIED REACTION  "maybe vision issues? "     Outpatient Medications Prior to Visit  Medication Sig Dispense Refill   acyclovir (ZOVIRAX) 400 MG tablet Take 1 tablet (400 mg total) by mouth 2 (two) times daily. 60 tablet 3   amiodarone (PACERONE) 200 MG tablet Take 1 tablet (200 mg total) by mouth daily. 45 tablet 1   apixaban (ELIQUIS) 5 MG TABS tablet Take 1 tablet (5 mg total) by mouth 2 (two) times daily. 180 tablet 1   Artificial Tear Solution (SOOTHE XP) SOLN Place 1 drop into both eyes every evening.     Cholecalciferol (VITAMIN D3) 50 MCG (2000 UT) capsule Take 1 capsule (2,000 Units total) by mouth daily.      CVS VITAMIN B12 1000 MCG tablet TAKE 1 TABLET BY MOUTH EVERY DAY 90 tablet 1   dapagliflozin propanediol (FARXIGA) 10 MG TABS tablet TAKE 1 TABLET BY MOUTH EVERY DAY 90 tablet 2   esomeprazole (NEXIUM) 20 MG capsule Take 20 mg by mouth daily as needed (Heartburn).     levothyroxine (SYNTHROID, LEVOTHROID) 100 MCG tablet Take 100 mcg by mouth daily before breakfast.  2   metoprolol tartrate (LOPRESSOR) 50 MG tablet Take 1 tablet (50 mg total) by mouth 2 (two) times daily. 60 tablet 1   OVER THE COUNTER MEDICATION Take 1 capsule by mouth in the morning and at bedtime. AREDS     polyethylene glycol (MIRALAX / GLYCOLAX) 17 g packet Take 17 g by mouth daily as needed for mild constipation.     pravastatin (PRAVACHOL) 40 MG tablet Take 40 mg by mouth every evening.     sennosides-docusate sodium (SENOKOT-S) 8.6-50 MG tablet Take 1 tablet by mouth daily as needed for constipation.     diltiazem (CARDIZEM CD) 120 MG 24 hr capsule Take 1 capsule (120 mg total) by mouth daily. 30 capsule 0   feeding supplement (ENSURE ENLIVE / ENSURE PLUS) LIQD Take 237 mLs by mouth 2 (two) times daily between meals. 14220 mL 0   furosemide (LASIX) 40 MG tablet Take 1 tablet (40 mg total) by mouth daily as needed for fluid.     levofloxacin (LEVAQUIN) 750 MG tablet Take 750 mg by mouth daily.     potassium chloride SA (KLOR-CON M) 20 MEQ tablet Take 1 tablet (20 mEq total) by mouth daily as needed (take when taking torsemide.). 30 tablet 0   No facility-administered medications prior to visit.    Review of Systems  Constitutional:  Negative for chills, diaphoresis, fever, malaise/fatigue and weight loss.  HENT:  Negative for congestion.   Respiratory:  Positive for cough. Negative for hemoptysis, sputum production, shortness of breath and wheezing.   Cardiovascular:  Negative for chest pain, palpitations and leg swelling.     Objective:   Vitals:   05/10/23 0930  BP: (!) 144/78  Pulse: 80  Temp: 97.8 F (36.6  C)  TempSrc: Oral  SpO2: 96%  Weight: 153 lb (69.4 kg)  Height: 5\' 3"  (1.6 m)   SpO2: 96 %  Physical Exam: General: Well-appearing, no acute distress HENT: [place], AT Eyes: EOMI, no scleral icterus Respiratory: Clear to auscultation bilaterally.  No crackles, wheezing or rales Cardiovascular: RRR, -M/R/G, no JVD Extremities:-Edema,-tenderness Neuro: AAO x4, CNII-XII grossly intact Psych: Normal mood, normal affect  Data Reviewed:  Imaging: CTA Chest Aorta 08/15/18 - Borderline mediastinal and hilar lymphadenopathy including 18 mm subcarinal and 17 mm low right paratracheal nodal. Bilateral GGO, interlobular septal thickening suggestive of pulmonary edema and scattered linear scarring/atelectasis. Small right effusion. Trace left effusion CXR 04/15/23 - Bibasilar atelectasis. S/p TAVR.  No infiltrate or effusion  PFT: 08/15/18  FVC 1.39 (56%) FEV1 1.06 (58%) Ratio 76  TLC 70% DLCO 54% Interpretation: Moderately severe restrictive defect with moderately reduced gas exchange  Labs: CBC    Component Value Date/Time   WBC 7.4 05/04/2023 0907   WBC 6.4 04/20/2023 0121   RBC 4.73 05/04/2023 0907   HGB 13.0 05/04/2023 0907   HGB 10.4 (L) 07/16/2019 0941   HCT 40.7 05/04/2023 0907   HCT 32.0 (L) 07/16/2019 0941   PLT 150 05/04/2023 0907   PLT 194 07/16/2019 0941   MCV 86.0 05/04/2023 0907   MCV 83 07/16/2019 0941   MCH 27.5 05/04/2023 0907   MCHC 31.9 05/04/2023 0907   RDW 17.2 (H) 05/04/2023 0907   RDW 16.9 (H) 07/16/2019 0941   LYMPHSABS 0.9 05/04/2023 0907   MONOABS 0.8 05/04/2023 0907   EOSABS 0.2 05/04/2023 0907   BASOSABS 0.1 05/04/2023 0907   Absolute eos 05/04/23 - 200     Assessment & Plan:   Discussion: HPI  Subacute cough - resolving --START Albuterol AS NEEDED for shortness of breath and wheezing  Oxygen dependence in setting of atrial fibrillatoin --ORDER ONO on room air to determine oxygen needs now  Health Maintenance Immunization History   Administered Date(s) Administered   Influenza-Unspecified 08/23/2018   PFIZER Comirnaty(Gray Top)Covid-19 Tri-Sucrose Vaccine 03/31/2021   PFIZER(Purple Top)SARS-COV-2 Vaccination 11/05/2019, 11/25/2019, 07/27/2020   Pfizer Covid-19 Vaccine Bivalent Booster 70yrs & up 07/25/2021   Tdap 02/19/2021   CT Lung Screen***  No orders of the defined types were placed in this encounter. No orders of the defined types were placed in this encounter.   No follow-ups on file.  I have spent a total time of***-minutes on the day of the appointment reviewing prior documentation, coordinating care and discussing medical diagnosis and plan with the patient/family. Imaging, labs and tests included in this note have been reviewed and interpreted independently by me.  Ladarrion Telfair Mechele Collin, MD [facility] 05/10/2023 9:51 AM  Office Number [phone]

## 2023-05-10 NOTE — Patient Instructions (Addendum)
Subacute cough - resolving --START Albuterol AS NEEDED for cough, shortness of breath and wheezing  Oxygen dependence in setting of atrial fibrillatoin --ORDER ONO on room air to determine oxygen needs now

## 2023-05-11 ENCOUNTER — Other Ambulatory Visit: Payer: Medicare Other

## 2023-05-11 ENCOUNTER — Ambulatory Visit: Payer: Medicare Other | Admitting: Hematology and Oncology

## 2023-05-11 ENCOUNTER — Ambulatory Visit: Payer: Medicare Other

## 2023-05-11 ENCOUNTER — Other Ambulatory Visit: Payer: Self-pay

## 2023-05-11 DIAGNOSIS — I509 Heart failure, unspecified: Secondary | ICD-10-CM | POA: Diagnosis not present

## 2023-05-11 DIAGNOSIS — I480 Paroxysmal atrial fibrillation: Secondary | ICD-10-CM | POA: Diagnosis not present

## 2023-05-11 DIAGNOSIS — I110 Hypertensive heart disease with heart failure: Secondary | ICD-10-CM | POA: Diagnosis not present

## 2023-05-11 DIAGNOSIS — E890 Postprocedural hypothyroidism: Secondary | ICD-10-CM | POA: Diagnosis not present

## 2023-05-11 MED ORDER — METOPROLOL TARTRATE 50 MG PO TABS: 50.0000 mg | ORAL_TABLET | Freq: Two times a day (BID) | ORAL | 1 refills | Status: DC

## 2023-05-14 DIAGNOSIS — I110 Hypertensive heart disease with heart failure: Secondary | ICD-10-CM | POA: Diagnosis not present

## 2023-05-14 DIAGNOSIS — E890 Postprocedural hypothyroidism: Secondary | ICD-10-CM | POA: Diagnosis not present

## 2023-05-14 DIAGNOSIS — I480 Paroxysmal atrial fibrillation: Secondary | ICD-10-CM | POA: Diagnosis not present

## 2023-05-14 DIAGNOSIS — I509 Heart failure, unspecified: Secondary | ICD-10-CM | POA: Diagnosis not present

## 2023-05-16 ENCOUNTER — Encounter (INDEPENDENT_AMBULATORY_CARE_PROVIDER_SITE_OTHER): Payer: Medicare Other | Admitting: Ophthalmology

## 2023-05-16 DIAGNOSIS — H35033 Hypertensive retinopathy, bilateral: Secondary | ICD-10-CM

## 2023-05-16 DIAGNOSIS — H35372 Puckering of macula, left eye: Secondary | ICD-10-CM

## 2023-05-16 DIAGNOSIS — H353231 Exudative age-related macular degeneration, bilateral, with active choroidal neovascularization: Secondary | ICD-10-CM

## 2023-05-16 DIAGNOSIS — H43813 Vitreous degeneration, bilateral: Secondary | ICD-10-CM

## 2023-05-16 DIAGNOSIS — I10 Essential (primary) hypertension: Secondary | ICD-10-CM | POA: Diagnosis not present

## 2023-05-17 ENCOUNTER — Inpatient Hospital Stay: Payer: Medicare Other | Admitting: Hematology and Oncology

## 2023-05-17 ENCOUNTER — Other Ambulatory Visit: Payer: Self-pay | Admitting: *Deleted

## 2023-05-17 ENCOUNTER — Inpatient Hospital Stay: Payer: Medicare Other

## 2023-05-17 ENCOUNTER — Other Ambulatory Visit: Payer: Self-pay

## 2023-05-17 ENCOUNTER — Inpatient Hospital Stay: Payer: Medicare Other | Admitting: Internal Medicine

## 2023-05-17 VITALS — BP 131/51 | HR 60 | Temp 98.0°F | Resp 17 | Wt 157.0 lb

## 2023-05-17 DIAGNOSIS — R059 Cough, unspecified: Secondary | ICD-10-CM | POA: Diagnosis not present

## 2023-05-17 DIAGNOSIS — R079 Chest pain, unspecified: Secondary | ICD-10-CM | POA: Diagnosis not present

## 2023-05-17 DIAGNOSIS — C9000 Multiple myeloma not having achieved remission: Secondary | ICD-10-CM

## 2023-05-17 DIAGNOSIS — I6310 Cerebral infarction due to embolism of unspecified precerebral artery: Secondary | ICD-10-CM

## 2023-05-17 DIAGNOSIS — I4891 Unspecified atrial fibrillation: Secondary | ICD-10-CM | POA: Diagnosis not present

## 2023-05-17 DIAGNOSIS — Z7901 Long term (current) use of anticoagulants: Secondary | ICD-10-CM | POA: Diagnosis not present

## 2023-05-17 DIAGNOSIS — I639 Cerebral infarction, unspecified: Secondary | ICD-10-CM | POA: Diagnosis not present

## 2023-05-17 DIAGNOSIS — D649 Anemia, unspecified: Secondary | ICD-10-CM | POA: Diagnosis not present

## 2023-05-17 DIAGNOSIS — R251 Tremor, unspecified: Secondary | ICD-10-CM | POA: Diagnosis not present

## 2023-05-17 DIAGNOSIS — G629 Polyneuropathy, unspecified: Secondary | ICD-10-CM | POA: Diagnosis not present

## 2023-05-17 DIAGNOSIS — Z5112 Encounter for antineoplastic immunotherapy: Secondary | ICD-10-CM | POA: Diagnosis not present

## 2023-05-17 DIAGNOSIS — I959 Hypotension, unspecified: Secondary | ICD-10-CM | POA: Diagnosis not present

## 2023-05-17 LAB — CBC WITH DIFFERENTIAL (CANCER CENTER ONLY)
Abs Immature Granulocytes: 0.05 10*3/uL (ref 0.00–0.07)
Basophils Absolute: 0 10*3/uL (ref 0.0–0.1)
Basophils Relative: 0 %
Eosinophils Absolute: 0 10*3/uL (ref 0.0–0.5)
Eosinophils Relative: 0 %
HCT: 37.5 % (ref 36.0–46.0)
Hemoglobin: 12.3 g/dL (ref 12.0–15.0)
Immature Granulocytes: 1 %
Lymphocytes Relative: 2 %
Lymphs Abs: 0.2 10*3/uL — ABNORMAL LOW (ref 0.7–4.0)
MCH: 28.1 pg (ref 26.0–34.0)
MCHC: 32.8 g/dL (ref 30.0–36.0)
MCV: 85.6 fL (ref 80.0–100.0)
Monocytes Absolute: 0.4 10*3/uL (ref 0.1–1.0)
Monocytes Relative: 4 %
Neutro Abs: 8.8 10*3/uL — ABNORMAL HIGH (ref 1.7–7.7)
Neutrophils Relative %: 93 %
Platelet Count: 166 10*3/uL (ref 150–400)
RBC: 4.38 MIL/uL (ref 3.87–5.11)
RDW: 18.5 % — ABNORMAL HIGH (ref 11.5–15.5)
WBC Count: 9.4 10*3/uL (ref 4.0–10.5)
nRBC: 0 % (ref 0.0–0.2)

## 2023-05-17 LAB — CMP (CANCER CENTER ONLY)
ALT: 14 U/L (ref 0–44)
AST: 18 U/L (ref 15–41)
Albumin: 3.3 g/dL — ABNORMAL LOW (ref 3.5–5.0)
Alkaline Phosphatase: 91 U/L (ref 38–126)
Anion gap: 8 (ref 5–15)
BUN: 22 mg/dL (ref 8–23)
CO2: 28 mmol/L (ref 22–32)
Calcium: 8.6 mg/dL — ABNORMAL LOW (ref 8.9–10.3)
Chloride: 98 mmol/L (ref 98–111)
Creatinine: 0.62 mg/dL (ref 0.44–1.00)
GFR, Estimated: >60 mL/min (ref >60)
Glucose, Bld: 123 mg/dL — ABNORMAL HIGH (ref 70–99)
Potassium: 3.8 mmol/L (ref 3.5–5.1)
Sodium: 134 mmol/L — ABNORMAL LOW (ref 135–145)
Total Bilirubin: 0.6 mg/dL (ref 0.3–1.2)
Total Protein: 5.1 g/dL — ABNORMAL LOW (ref 6.5–8.1)

## 2023-05-17 LAB — LACTATE DEHYDROGENASE: LDH: 341 U/L — ABNORMAL HIGH (ref 98–192)

## 2023-05-17 MED ORDER — DARATUMUMAB-HYALURONIDASE-FIHJ 1800-30000 MG-UT/15ML ~~LOC~~ SOLN
1800.0000 mg | Freq: Once | SUBCUTANEOUS | Status: AC
  Administered 2023-05-17: 1800 mg via SUBCUTANEOUS
  Filled 2023-05-17: qty 15

## 2023-05-17 MED ORDER — DEXAMETHASONE 4 MG PO TABS
20.0000 mg | ORAL_TABLET | Freq: Once | ORAL | Status: DC
  Filled 2023-05-17: qty 5

## 2023-05-17 MED ORDER — ACETAMINOPHEN 325 MG PO TABS
650.0000 mg | ORAL_TABLET | Freq: Once | ORAL | Status: DC
  Filled 2023-05-17: qty 2

## 2023-05-17 MED ORDER — LORATADINE 10 MG PO TABS
10.0000 mg | ORAL_TABLET | Freq: Every day | ORAL | Status: DC
  Administered 2023-05-17: 10 mg via ORAL
  Filled 2023-05-17: qty 1

## 2023-05-17 NOTE — Progress Notes (Signed)
[facility] [phone]   [phone]   PROGRESS NOTE   Patient Care Team: Associates, [facility] as PCP - General (Rheumatology)   Hematological/Oncological History # IgG Lambda Multiple Myeloma  06/14/2022: establish care with [name] due to anemia. Labs showed M protein 4.9, Kappa 7.2, Lambda 10.8, ratio 0.67 07/13/2022: Bmbx showed Lambda restricted plasma cell neoplasm involving approximately 60% of the cellular marrow by IHC on the biopsy.  08/01/2022: Cycle 1 Day 1 of VRd chemotherapy. (Holding revlimid initially) 08/21/2022:  Cycle 2 Day 1 of VRd chemotherapy. (Holding revlimid) 09/04/2022: Cycle 3 Day 1 of VRd chemotherapy. Started revlimid 10/03/2022: Cycle 4 Day 1 of VRd chemotherapy 10/31/2022: Cycle 5 Day 1 of VRd chemotherapy 11/20/2022: Cycle 6 Day 1 of VRd chemotherapy 12/11/2022: Cycle 7 Day 1 of VRd chemotherapy 01/02/2023: Cycle 8 Day 1 of VRd chemotherapy 01/22/2023: Cycle 9 Day 1 of VRd chemotherapy 02/23/2023: Day 1 Cycle 1 of Dara/Rev/Dex 03/23/2023: Day 1 Cycle 2 of Dara/Rev/Dex 05/06/2023: chemotherapy on pause due to chest pain/cardiac/respiratory issues.     INTERVAL HISTORY: [name] 89 y.o. female returns to the clinic today for a follow-up visit accompanied by her daughter-in-law. Her last visit was on 05/04/2023. In the interim, she continued on Dara/Rev/Dex.   Ms. Rubalcava reports she has been well overall in the interim since her last visit.  She reports that her chest pain and heart rate have been under better control.  She notes she is not having as much difficulty with chest pain and is taking 2 new medications.  She continues to follow with cardiology.  Her M protein has declined down to 0.2 and she is delighted to hear that.  She notes that she takes Tylenol daily, generally 2 extra strength at 7 AM and 2 at 1 PM.  Otherwise she is tolerating her chemotherapy well and enjoyed her holiday off the  medication.  She is willing and able to proceed with treatment today.  She denies any fevers, chills, sweats, nausea vomiting or diarrhea.  A full 10 point ROS is otherwise negative.  MEDICAL HISTORY: Past Medical History:  Diagnosis Date   Arthritis    some - per patient   Breast cancer (HCC)    breast cancer / left    Cataract    bilat    GERD (gastroesophageal reflux disease)    History of kidney stones    Hyperlipidemia    Hypertension    Hypothyroidism    Macular degeneration    Left   S/P TAVR (transcatheter aortic valve replacement) 09/03/2018   23 mm Edwards Sapien 3 transcatheter heart valve placed via percutaneous right transfemoral approach    Severe aortic stenosis    Stress incontinence    Thyroid disease    Tinnitus     ALLERGIES:  is allergic to quinolones, penicillins, and sulfa antibiotics.  MEDICATIONS:  Current Outpatient Medications  Medication Sig Dispense Refill   acyclovir (ZOVIRAX) 400 MG tablet Take 1 tablet (400 mg total) by mouth 2 (two) times daily. 60 tablet 3   albuterol (VENTOLIN HFA) 108 (90 Base) MCG/ACT inhaler Inhale 2 puffs into the lungs every 6 (six) hours as needed for wheezing or shortness of breath. (Patient not taking: Reported on 05/17/2023) 8 g 2   amiodarone (PACERONE) 200 MG tablet Take 1 tablet (200 mg total) by mouth daily. 45 tablet 1   apixaban (ELIQUIS) 5 MG TABS tablet Take 1 tablet (5 mg total) by mouth 2 (two)  times daily. 180 tablet 1   Artificial Tear Solution (SOOTHE XP) SOLN Place 1 drop into both eyes every evening.     Cholecalciferol (VITAMIN D3) 50 MCG (2000 UT) capsule Take 1 capsule (2,000 Units total) by mouth daily.     [employer] VITAMIN B12 1000 MCG tablet TAKE 1 TABLET BY MOUTH EVERY DAY 90 tablet 1   dapagliflozin propanediol (FARXIGA) 10 MG TABS tablet TAKE 1 TABLET BY MOUTH EVERY DAY 90 tablet 2   esomeprazole (NEXIUM) 20 MG capsule Take 20 mg by mouth daily as needed (Heartburn).     lenalidomide (REVLIMID) 25 MG  capsule Take 1 capsule (25 mg total) by mouth daily. [employer] Auth # [id number]     Date Obtained 05/18/23 Take one capsule daily for 14 days none for 7 14 capsule 0   levothyroxine (SYNTHROID, LEVOTHROID) 100 MCG tablet Take 100 mcg by mouth daily before breakfast.  2   metoprolol tartrate (LOPRESSOR) 50 MG tablet Take 1 tablet (50 mg total) by mouth 2 (two) times daily. 60 tablet 1   Multiple Vitamins-Minerals (PRESERVISION AREDS) CAPS Take 1 capsule by mouth 2 (two) times daily.     OVER THE COUNTER MEDICATION Take 1 capsule by mouth in the morning and at bedtime. AREDS     polyethylene glycol (MIRALAX / GLYCOLAX) 17 g packet Take 17 g by mouth daily as needed for mild constipation.     pravastatin (PRAVACHOL) 40 MG tablet Take 40 mg by mouth every evening.     sennosides-docusate sodium (SENOKOT-S) 8.6-50 MG tablet Take 1 tablet by mouth daily as needed for constipation.     No current facility-administered medications for this visit.    SURGICAL HISTORY:  Past Surgical History:  Procedure Laterality Date   ABDOMINAL HYSTERECTOMY  1970's   BACK SURGERY     BREAST LUMPECTOMY  12/1998   lumpectomy   CARDIAC CATHETERIZATION     CARDIOVERSION N/A 04/18/2023   Procedure: CARDIOVERSION;  Surgeon: Tessa Lerner, DO;  Location: [facility] INVASIVE CV LAB;  Service: Cardiovascular;  Laterality: N/A;   CARDIOVERSION N/A 04/20/2023   Procedure: CARDIOVERSION;  Surgeon: Yates Decamp, MD;  Location: [facility] INVASIVE CV LAB;  Service: Cardiovascular;  Laterality: N/A;   EYE SURGERY     cataract surgery bilat    INTRAOPERATIVE TRANSTHORACIC ECHOCARDIOGRAM N/A 09/03/2018   Procedure: INTRAOPERATIVE TRANSTHORACIC ECHOCARDIOGRAM;  Surgeon: Kathleene Hazel, MD;  Location: [facility] OR;  Service: Open Heart Surgery;  Laterality: N/A;   KYPHOPLASTY N/A 09/07/2022   Procedure: THORACIC EIGHT KYPHOPLASTY;  Surgeon: Venita Lick, MD;  Location: [facility] OR;  Service: Orthopedics;  Laterality: N/A;  1 hr Local with [facility] 3  C-Bed   LITHOTRIPSY     Right total knee     2018 Dr. Charlann Boxer   RIGHT/LEFT HEART CATH AND CORONARY ANGIOGRAPHY N/A 08/06/2018   Procedure: RIGHT/LEFT HEART CATH AND CORONARY ANGIOGRAPHY;  Surgeon: Yates Decamp, MD;  Location: [facility] INVASIVE CV LAB;  Service: Cardiovascular;  Laterality: N/A;   THYROIDECTOMY, PARTIAL  1975   TONSILLECTOMY     as a child - patient not sure of exact date   TOTAL KNEE ARTHROPLASTY Left 03/13/2016   Procedure: TOTAL KNEE ARTHROPLASTY;  Surgeon: Durene Romans, MD;  Location: [facility] ORS;  Service: Orthopedics;  Laterality: Left;   TOTAL KNEE ARTHROPLASTY Right 06/18/2017   Procedure: RIGHT TOTAL KNEE ARTHROPLASTY;  Surgeon: Durene Romans, MD;  Location: [facility] ORS;  Service: Orthopedics;  Laterality: Right;   TRANSCATHETER AORTIC VALVE REPLACEMENT, TRANSFEMORAL N/A 09/03/2018  Procedure: TRANSCATHETER AORTIC VALVE REPLACEMENT, TRANSFEMORAL;  Surgeon: Kathleene Hazel, MD;  Location: [facility] OR;  Service: Open Heart Surgery;  Laterality: N/A;   REVIEW OF SYSTEMS:   Constitutional: ( - ) fevers, ( - )  chills , ( - ) night sweats Eyes: ( - ) blurriness of vision, ( - ) double vision, ( - ) watery eyes Ears, nose, mouth, throat, and face: ( - ) mucositis, ( - ) sore throat Respiratory: ( +) cough, ( - ) dyspnea, ( - ) wheezes Cardiovascular: ( - ) palpitation, ( - ) chest discomfort, ( +) lower extremity swelling Gastrointestinal:  ( - ) nausea, ( - ) heartburn, ( - ) change in bowel habits Skin: ( - ) abnormal skin rashes Lymphatics: ( - ) new lymphadenopathy, ( - ) easy bruising Neurological: ( - ) numbness, ( - ) tingling, ( - ) new weaknesses Behavioral/Psych: ( - ) mood change, ( - ) new changes  All other systems were reviewed with the patient and are negative.   PHYSICAL EXAMINATION:  There were no vitals taken for this visit.  ECOG PERFORMANCE STATUS: 1  GENERAL: well appearing elderly Caucasian female, alert, no distress and comfortable SKIN: skin color, texture,  turgor are normal, no rashes or significant lesions EYES: conjunctiva are pink and non-injected, sclera clear LUNGS:normal breathing effort. Mild wheezing and crackles in lower lobes b/l.  HEART: irregular rhythm ,normal rate and no murmurs. +1 bilateral lower extremity edema starting below the knee to ankles.  Musculoskeletal: no cyanosis of digits and no clubbing  PSYCH: alert & oriented x 3, fluent speech NEURO: no focal motor/sensory deficits    LABORATORY DATA: Lab Results  Component Value Date   WBC 9.4 05/17/2023   HGB 12.3 05/17/2023   HCT 37.5 05/17/2023   MCV 85.6 05/17/2023   PLT 166 05/17/2023      Chemistry      Component Value Date/Time   NA 134 (L) 05/17/2023 1036   NA 130 (L) 06/19/2022 1012   K 3.8 05/17/2023 1036   CL 98 05/17/2023 1036   CO2 28 05/17/2023 1036   BUN 22 05/17/2023 1036   BUN 21 06/19/2022 1012   CREATININE 0.62 05/17/2023 1036      Component Value Date/Time   CALCIUM 8.6 (L) 05/17/2023 1036   ALKPHOS 91 05/17/2023 1036   AST 18 05/17/2023 1036   ALT 14 05/17/2023 1036   BILITOT 0.6 05/17/2023 1036       RADIOGRAPHIC STUDIES:  No results found.   ASSESSMENT/PLAN:  JULANE CROCK is a 89 y.o. female who presents to the clinic for evaluation for IgG lambda multiple myeloma.   # IgG Lambda Multiple Myeloma  --diagnosis of MM confirmed with bone marrow biopsy showing 60% plasma cells and anemia -- Bone survey shows no lytic lesions, kidney function is within normal limits.  --08/01/2022 was Cycle 1 Day 1 of VRd  -Dr. Leonides Schanz discontinued velcade on 02/05/23 due to rash. She is status post day 15 cycle #9.  -Dr. Leonides Schanz changed the care plan to Daratumumab, Revlimid, and decadron. She is scheduled for day 1 cycle #1 today  -Started Cycle 1, Day 1 of Dara/Rev/Dex on 02/23/2023.  Plan:  --Due for Cycle 3 Day 1 of Dara/Rev/Dex today --Labs show white blood cell count 9.4, hemoglobin 12.3, MCV 85.6, and platelets of 166 --last M  protein from 05/04/2023 trended down to 0.2 (4.9 prior to start of therapy). Normalized SFLC.  --RTC in 2  weeks with interval weekly treatment.   #Cough: --Chest xray from 03/23/2023 was negative for infectious process.  --Currently taking tessalon perles 100 mg TID with some improvement. --No fever and cough is mainly dry with occasional clear sputum. Cough worsens in supine position. --Encouraged to take mucinex and clairitin.  Patient scheduled for an upcoming appointment with pulmonology.  # Vaginal itching: --Sent prescription for nystatin powder    #Hypotension/BP/Atrial Fibrillation  --Follows with cardiology, Dr. Jacinto Halim -- Recommendations include metoprolol succinate 25 mg PO daily and restarted patient back on amiodarone 200 mg PO daily x 10 days followed by 12 mg daily.  --Continue on Eliquis 5 mg BID   #Bilateral lower extremity edema:  --Continue to wear compression stockings.    # Normocytic Anemia #Vitamin B12 Deficiency --Anemia likely driven by multiple myeloma, but may be component of vitamin B12 deficiency as well. --initial labs showed elevated MMA with B12 180 -- Continue vitamin B12 1000 mcg p.o. daily -- Continue to monitor   #Left leg/thigh neuropathy--improving: --Currently on gabapentin 900 mg nightly and 600 mg in the morning --Encouraged to try water aerobics and stretches.  --Following with Dr. Debbe Bales (ortho) to see if she is a candidate for steroid injections.  -- Patient evaluated by Dr. Barbaraann Cao thinks that this may be neuropathy worsened by her Velcade therapy.   #Episode of right UE tremor and speech abnormality: --Evaluated by Dr. Barbaraann Cao on 03/20/2023. Presumed etiology had been atrial fibrillation, but vascular disease, atheromatous changes, are also possibly implicated concurrently.  --MRI brain from 03/13/2023 show no acute finding including no evidence of intracranial neoplastic disease. There are chronic small-vessel ischemic changes of the pons,  cerebral hemispheric white matter and right cerebellum. --Educated patient to seek immediate evaluate if there are repeat episodes. --No neurological deficits identified per exam today  #Supportive Care -- chemotherapy education complete.  -- port placement not required.  --Awaiting to start Zometa/Xgeva  -- zofran 8mg  q8H PRN and compazine 10mg  PO q6H for nausea -- acyclovir 400mg  PO BID for VCZ prophylaxis -- tylenol 1000 mg q8H PRN for back pain.   No orders of the defined types were placed in this encounter.   Patient expressed understanding of the plan provided.   I have spent a total of 30 minutes minutes of face-to-face and non-face-to-face time, preparing to see the patient, performing a medically appropriate examination, counseling and educating the patient, ordering tests/procedures, documenting clinical information in the electronic health record, and care coordination.   Ulysees Barns, MD Department of Hematology/Oncology [facility] Phone: [phone] Pager: [phone] Email: [email or web address]

## 2023-05-17 NOTE — Progress Notes (Signed)
[facility] at [facility] [address]  [place], [place] [address] [phone]   Interval Evaluation  Date of Service: 05/17/23 Patient Name: Zoe [name] Patient MRN: [id number] Patient DOB: 1933-12-01 Provider: Henreitta Leber, MD  Identifying Statement:  Zoe [name] is a 89 y.o. female with Cerebrovascular accident (CVA) due to embolism of precerebral artery (HCC)   Primary Cancer:  Oncologic History: Oncology History  Multiple myeloma not having achieved remission (HCC)  07/24/2022 Initial Diagnosis   Multiple myeloma not having achieved remission (HCC)   08/01/2022 - 02/12/2023 Chemotherapy   Patient is on Treatment Plan : MYELOMA NON-TRANSPLANT CANDIDATES VRd weekly q21d     02/23/2023 -  Chemotherapy   Patient is on Treatment Plan : MYELOMA RELAPSED REFRACTORY Daratumumab SQ + Lenalidomide + Dexamethasone (DaraRd) q28d       Interval History: Zoe [name] presents today for follow up.  She describes no further episodes of sudden onset "very confused, not making sense" she had experienced prior.  She did have a hospitalization for afib, heart failure and a prolonged stint in rehab earlier this month.  For neuropathy, she describes stable symptoms, no longer dosing gabapentin.  Increasing activity in recent days.  Continues on infusions with Dr. Leonides Schanz.  H+P (11/28/22) Patient presents to review neuropathy symptoms.  She describes burning type pain in her left leg, primarily.  There is also some numbness in her feet.  Onset was several months ago, since chemotherapy was started.  She has been walking independently, typically with a cane for chronic back issues.  She has been dosing lyrica 100mg  BID, but this has been ineffective.  Denies headaches, seizures.  Continues on velcade with Dr. Leonides Schanz for myeloma.      Medications: Current Outpatient Medications on File Prior to Visit  Medication Sig Dispense Refill   acyclovir  (ZOVIRAX) 400 MG tablet Take 1 tablet (400 mg total) by mouth 2 (two) times daily. 60 tablet 3   amiodarone (PACERONE) 200 MG tablet Take 1 tablet (200 mg total) by mouth daily. 45 tablet 1   apixaban (ELIQUIS) 5 MG TABS tablet Take 1 tablet (5 mg total) by mouth 2 (two) times daily. 180 tablet 1   Artificial Tear Solution (SOOTHE XP) SOLN Place 1 drop into both eyes every evening.     Cholecalciferol (VITAMIN D3) 50 MCG (2000 UT) capsule Take 1 capsule (2,000 Units total) by mouth daily.     [employer]S VITAMIN B12 1000 MCG tablet TAKE 1 TABLET BY MOUTH EVERY DAY 90 tablet 1   dapagliflozin propanediol (FARXIGA) 10 MG TABS tablet TAKE 1 TABLET BY MOUTH EVERY DAY 90 tablet 2   esomeprazole (NEXIUM) 20 MG capsule Take 20 mg by mouth daily as needed (Heartburn).     levothyroxine (SYNTHROID, LEVOTHROID) 100 MCG tablet Take 100 mcg by mouth daily before breakfast.  2   metoprolol tartrate (LOPRESSOR) 50 MG tablet Take 1 tablet (50 mg total) by mouth 2 (two) times daily. 60 tablet 1   Multiple Vitamins-Minerals (PRESERVISION AREDS) CAPS Take 1 capsule by mouth 2 (two) times daily.     OVER THE COUNTER MEDICATION Take 1 capsule by mouth in the morning and at bedtime. AREDS     polyethylene glycol (MIRALAX / GLYCOLAX) 17 g packet Take 17 g by mouth daily as needed for mild constipation.     pravastatin (PRAVACHOL) 40 MG tablet Take 40 mg by mouth every evening.     sennosides-docusate sodium (SENOKOT-S)  8.6-50 MG tablet Take 1 tablet by mouth daily as needed for constipation.     albuterol (VENTOLIN HFA) 108 (90 Base) MCG/ACT inhaler Inhale 2 puffs into the lungs every 6 (six) hours as needed for wheezing or shortness of breath. (Patient not taking: Reported on 05/17/2023) 8 g 2   No current facility-administered medications on file prior to visit.    Allergies:  Allergies  Allergen Reactions   Quinolones Other (See Comments)    Patient on Amiodarone and can prolong QT   Penicillins Other (See Comments)     UNSPECIFIED REACTION  Patient does not remember reaction.  Has patient had a PCN reaction causing immediate rash, facial/tongue/throat swelling, SOB or lightheadedness with hypotension: no Has patient had a PCN reaction causing severe rash involving mucus membranes or skin necrosis: no Has patient had a PCN reaction that required hospitalization no Has patient had a PCN reaction occurring within the last 10 years: no If all of the above answers are "NO", then may proceed with Cephalosporin use.    Sulfa Antibiotics Other (See Comments)    UNSPECIFIED REACTION  "maybe vision issues? "   Past Medical History:  Past Medical History:  Diagnosis Date   Arthritis    some - per patient   Breast cancer (HCC)    breast cancer / left    Cataract    bilat    GERD (gastroesophageal reflux disease)    History of kidney stones    Hyperlipidemia    Hypertension    Hypothyroidism    Macular degeneration    Left   S/P TAVR (transcatheter aortic valve replacement) 09/03/2018   23 mm Edwards Sapien 3 transcatheter heart valve placed via percutaneous right transfemoral approach    Severe aortic stenosis    Stress incontinence    Thyroid disease    Tinnitus    Past Surgical History:  Past Surgical History:  Procedure Laterality Date   ABDOMINAL HYSTERECTOMY  1970's   BACK SURGERY     BREAST LUMPECTOMY  12/1998   lumpectomy   CARDIAC CATHETERIZATION     CARDIOVERSION N/A 04/18/2023   Procedure: CARDIOVERSION;  Surgeon: Tessa Lerner, DO;  Location: [facility] INVASIVE CV LAB;  Service: Cardiovascular;  Laterality: N/A;   CARDIOVERSION N/A 04/20/2023   Procedure: CARDIOVERSION;  Surgeon: Yates Decamp, MD;  Location: [facility] INVASIVE CV LAB;  Service: Cardiovascular;  Laterality: N/A;   EYE SURGERY     cataract surgery bilat    INTRAOPERATIVE TRANSTHORACIC ECHOCARDIOGRAM N/A 09/03/2018   Procedure: INTRAOPERATIVE TRANSTHORACIC ECHOCARDIOGRAM;  Surgeon: Kathleene Hazel, MD;  Location: [facility] OR;  Service:  Open Heart Surgery;  Laterality: N/A;   KYPHOPLASTY N/A 09/07/2022   Procedure: THORACIC EIGHT KYPHOPLASTY;  Surgeon: Venita Lick, MD;  Location: [facility] OR;  Service: Orthopedics;  Laterality: N/A;  1 hr Local with [facility] 3 C-Bed   LITHOTRIPSY     Right total knee     2018 Dr. Charlann Boxer   RIGHT/LEFT HEART CATH AND CORONARY ANGIOGRAPHY N/A 08/06/2018   Procedure: RIGHT/LEFT HEART CATH AND CORONARY ANGIOGRAPHY;  Surgeon: Yates Decamp, MD;  Location: [facility] INVASIVE CV LAB;  Service: Cardiovascular;  Laterality: N/A;   THYROIDECTOMY, PARTIAL  1975   TONSILLECTOMY     as a child - patient not sure of exact date   TOTAL KNEE ARTHROPLASTY Left 03/13/2016   Procedure: TOTAL KNEE ARTHROPLASTY;  Surgeon: Durene Romans, MD;  Location: [facility] ORS;  Service: Orthopedics;  Laterality: Left;   TOTAL KNEE ARTHROPLASTY Right 06/18/2017  Procedure: RIGHT TOTAL KNEE ARTHROPLASTY;  Surgeon: Durene Romans, MD;  Location: [facility] ORS;  Service: Orthopedics;  Laterality: Right;   TRANSCATHETER AORTIC VALVE REPLACEMENT, TRANSFEMORAL N/A 09/03/2018   Procedure: TRANSCATHETER AORTIC VALVE REPLACEMENT, TRANSFEMORAL;  Surgeon: Kathleene Hazel, MD;  Location: [facility] OR;  Service: Open Heart Surgery;  Laterality: N/A;   Social History:  Social History   Socioeconomic History   Marital status: Widowed    Spouse name: Not on file   Number of children: 4   Years of education: Not on file   Highest education level: Master's degree (e.g., MA, MS, MEng, MEd, MSW, MBA)  Occupational History   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  Tobacco Use   Smoking status: Never   Smokeless tobacco: Never  Vaping Use   Vaping status: Never Used  Substance and Sexual Activity   Alcohol use: No   Drug use: No   Sexual activity: Not Currently  Other Topics Concern   Not on file  Social History Narrative   Not on file   Social Determinants of Health   Financial Resource Strain: Low Risk  (11/12/2018)    Overall Financial Resource Strain (CARDIA)    Difficulty of Paying Living Expenses: Not very hard  Food Insecurity: No Food Insecurity (04/22/2023)   Hunger Vital Sign    Worried About Running Out of Food in the Last Year: Never true    Ran Out of Food in the Last Year: Never true  Transportation Needs: No Transportation Needs (04/22/2023)   PRAPARE - Administrator, Civil Service (Medical): No    Lack of Transportation (Non-Medical): No  Physical Activity: Inactive (11/12/2018)   Exercise Vital Sign    Days of Exercise per Week: 0 days    Minutes of Exercise per Session: 0 min  Stress: Stress Concern Present (11/12/2018)   [employer] of Occupational Health - Occupational Stress Questionnaire    Feeling of Stress : To some extent  Social Connections: Not on file  Intimate Partner Violence: Patient Declined (04/22/2023)   Humiliation, Afraid, Rape, and Kick questionnaire    Fear of Current or Ex-Partner: Patient declined    Emotionally Abused: Patient declined    Physically Abused: Patient declined    Sexually Abused: Patient declined   Family History:  Family History  Problem Relation Age of Onset   Diabetes Mother    Stroke Mother        Carotid artery disease   Heart disease Father        CAD   Coronary artery disease Father    Diabetes Sister     Review of Systems: Constitutional: Doesn't report fevers, chills or abnormal weight loss Eyes: Doesn't report blurriness of vision Ears, nose, mouth, throat, and face: Doesn't report sore throat Respiratory: Doesn't report cough, dyspnea or wheezes Cardiovascular: Doesn't report palpitation, chest discomfort  Gastrointestinal:  Doesn't report nausea, constipation, diarrhea GU: Doesn't report incontinence Skin: Doesn't report skin rashes Neurological: Per HPI Musculoskeletal: Doesn't report joint pain Behavioral/Psych: Doesn't report anxiety  Physical Exam: Wt Readings from Last 3 Encounters:  05/17/23 157  lb (71.2 kg)  05/10/23 153 lb (69.4 kg)  05/09/23 153 lb (69.4 kg)   Temp Readings from Last 3 Encounters:  05/17/23 98 F (36.7 C) (Oral)  05/10/23 97.8 F (36.6 C) (Oral)  05/04/23 98.2 F (36.8 C) (Oral)   BP Readings from Last 3 Encounters:  05/17/23 (!) 131/51  05/10/23 (!) 144/78  05/09/23 (!) 140/70  Pulse Readings from Last 3 Encounters:  05/17/23 60  05/10/23 80  05/09/23 77   KPS: 80. General: Alert, cooperative, pleasant, in no acute distress Head: Normal EENT: No conjunctival injection or scleral icterus.  Lungs: Resp effort normal Cardiac: Regular rate Abdomen: Non-distended abdomen Skin: No rashes cyanosis or petechiae. Extremities: No clubbing or edema  Neurologic Exam: Mental Status: Awake, alert, attentive to examiner. Oriented to self and environment. Language is fluent with intact comprehension.  Cranial Nerves: Visual acuity is grossly normal. Visual fields are full. Extra-ocular movements intact. No ptosis. Face is symmetric Motor: Tone and bulk are normal. Power is full in both arms and legs. Reflexes are symmetric, no pathologic reflexes present.  Sensory: Intact to light touch Gait: Cane assisted   Labs: I have reviewed the data as listed    Component Value Date/Time   NA 136 05/04/2023 0907   NA 130 (L) 06/19/2022 1012   K 3.8 05/04/2023 0907   CL 99 05/04/2023 0907   CO2 28 05/04/2023 0907   GLUCOSE 115 (H) 05/04/2023 0907   BUN 17 05/04/2023 0907   BUN 21 06/19/2022 1012   CREATININE 0.76 05/04/2023 0907   CALCIUM 8.6 (L) 05/04/2023 0907   PROT 5.4 (L) 05/04/2023 0907   PROT 9.2 (H) 07/16/2019 0941   ALBUMIN 3.1 (L) 05/04/2023 0907   ALBUMIN 2.9 (L) 07/16/2019 0941   AST 15 05/04/2023 0907   ALT 16 05/04/2023 0907   ALKPHOS 109 05/04/2023 0907   BILITOT 0.6 05/04/2023 0907   GFRNONAA >60 05/04/2023 0907   GFRAA 78 07/16/2019 0941   Lab Results  Component Value Date   WBC 7.4 05/04/2023   NEUTROABS 5.4 05/04/2023   HGB  13.0 05/04/2023   HCT 40.7 05/04/2023   MCV 86.0 05/04/2023   PLT 150 05/04/2023     Assessment/Plan Cerebrovascular accident (CVA) due to embolism of precerebral artery (HCC)  Zoe [name] is clinically stable today from CNS standpoint.  No further stroke symptoms.  Neuropathy is stable as well.  Should continue Eliquis and the high dose statin for secondary prevention.  Ok with remaining off gabapentin if remains under control.  We appreciate the opportunity to participate in the care of Zoe [name].  She may follow up with [place] as needed.  All questions were answered. The patient knows to call the clinic with any problems, questions or concerns. No barriers to learning were detected.  The total time spent in the encounter was 30 minutes and more than 50% was on counseling and review of test results   Henreitta Leber, MD Medical Director of Neuro-Oncology [facility] at [facility] 05/17/23 11:16 AM

## 2023-05-17 NOTE — Patient Instructions (Signed)
[facility]  Discharge Instructions: Thank you for choosing [facility] to provide your oncology and hematology care.   If you have a lab appointment with the Cancer Center, please go directly to the Cancer Center and check in at the registration area.   Wear comfortable clothing and clothing appropriate for easy access to any Portacath or PICC line.   We strive to give you quality time with your provider. You may need to reschedule your appointment if you arrive late (15 or more minutes).  Arriving late affects you and other patients whose appointments are after yours.  Also, if you miss three or more appointments without notifying the office, you may be dismissed from the clinic at the provider's discretion.      For prescription refill requests, have your pharmacy contact our office and allow 72 hours for refills to be completed.    Today you received the following chemotherapy and/or immunotherapy agents Darzalex Faspro      To help prevent nausea and vomiting after your treatment, we encourage you to take your nausea medication as directed.  BELOW ARE SYMPTOMS THAT SHOULD BE REPORTED IMMEDIATELY: *FEVER GREATER THAN 100.4 F (38 C) OR HIGHER *CHILLS OR SWEATING *NAUSEA AND VOMITING THAT IS NOT CONTROLLED WITH YOUR NAUSEA MEDICATION *UNUSUAL SHORTNESS OF BREATH *UNUSUAL BRUISING OR BLEEDING *URINARY PROBLEMS (pain or burning when urinating, or frequent urination) *BOWEL PROBLEMS (unusual diarrhea, constipation, pain near the anus) TENDERNESS IN MOUTH AND THROAT WITH OR WITHOUT PRESENCE OF ULCERS (sore throat, sores in mouth, or a toothache) UNUSUAL RASH, SWELLING OR PAIN  UNUSUAL VAGINAL DISCHARGE OR ITCHING   Items with * indicate a potential emergency and should be followed up as soon as possible or go to the Emergency Department if any problems should occur.  Please show the CHEMOTHERAPY ALERT CARD or IMMUNOTHERAPY ALERT CARD at  check-in to the Emergency Department and triage nurse.  Should you have questions after your visit or need to cancel or reschedule your appointment, please contact [facility]  Dept: [phone]  and follow the prompts.  Office hours are 8:00 a.m. to 4:30 p.m. Monday - Friday. Please note that voicemails left after 4:00 p.m. may not be returned until the following business day.  We are closed weekends and major holidays. You have access to a nurse at all times for urgent questions. Please call the main number to the clinic Dept: [phone] and follow the prompts.   For any non-urgent questions, you may also contact your provider using MyChart. We now offer e-Visits for anyone 18 and older to request care online for non-urgent symptoms. For details visit [email or web address].   Also download the MyChart app! Go to the app store, search "MyChart", open the app, select [facility], and log in with your MyChart username and password.  

## 2023-05-18 ENCOUNTER — Ambulatory Visit: Payer: Medicare Other | Admitting: Physician Assistant

## 2023-05-18 ENCOUNTER — Other Ambulatory Visit: Payer: Medicare Other

## 2023-05-18 ENCOUNTER — Ambulatory Visit: Payer: Medicare Other

## 2023-05-18 ENCOUNTER — Ambulatory Visit: Payer: Medicare Other | Admitting: Hematology and Oncology

## 2023-05-18 ENCOUNTER — Telehealth: Payer: Self-pay | Admitting: *Deleted

## 2023-05-18 ENCOUNTER — Other Ambulatory Visit: Payer: Self-pay | Admitting: *Deleted

## 2023-05-18 DIAGNOSIS — I110 Hypertensive heart disease with heart failure: Secondary | ICD-10-CM | POA: Diagnosis not present

## 2023-05-18 DIAGNOSIS — I480 Paroxysmal atrial fibrillation: Secondary | ICD-10-CM | POA: Diagnosis not present

## 2023-05-18 DIAGNOSIS — E890 Postprocedural hypothyroidism: Secondary | ICD-10-CM | POA: Diagnosis not present

## 2023-05-18 DIAGNOSIS — I509 Heart failure, unspecified: Secondary | ICD-10-CM | POA: Diagnosis not present

## 2023-05-18 MED ORDER — LENALIDOMIDE 25 MG PO CAPS: 25.0000 mg | ORAL_CAPSULE | Freq: Every day | ORAL | 0 refills | Status: DC

## 2023-05-18 NOTE — Telephone Encounter (Signed)
TCT patient regarding her Revlimid. Spoke with her.  Advised that Dr. Leonides Schanz is resuming her Revlimid at the previous dosage of 25 mg 14 days on 7 days off. Pt agreed that this is what he told her yesterday. Asked pt if she had any Revlimid left. She states she just has 2 capsules only. Advised that I would send ina refill for her and to resume when she gets the new bottle from [facility]. Pt is in agreement. Advised that she will likely receive this next week.  No further questions or concerns.  Escribed Revlimid to [facility].

## 2023-05-20 ENCOUNTER — Encounter: Payer: Self-pay | Admitting: Hematology and Oncology

## 2023-05-22 DIAGNOSIS — I509 Heart failure, unspecified: Secondary | ICD-10-CM | POA: Diagnosis not present

## 2023-05-22 DIAGNOSIS — E890 Postprocedural hypothyroidism: Secondary | ICD-10-CM | POA: Diagnosis not present

## 2023-05-22 DIAGNOSIS — I480 Paroxysmal atrial fibrillation: Secondary | ICD-10-CM | POA: Diagnosis not present

## 2023-05-22 DIAGNOSIS — I110 Hypertensive heart disease with heart failure: Secondary | ICD-10-CM | POA: Diagnosis not present

## 2023-05-23 DIAGNOSIS — I509 Heart failure, unspecified: Secondary | ICD-10-CM | POA: Diagnosis not present

## 2023-05-23 DIAGNOSIS — I480 Paroxysmal atrial fibrillation: Secondary | ICD-10-CM | POA: Diagnosis not present

## 2023-05-23 DIAGNOSIS — I110 Hypertensive heart disease with heart failure: Secondary | ICD-10-CM | POA: Diagnosis not present

## 2023-05-23 DIAGNOSIS — E890 Postprocedural hypothyroidism: Secondary | ICD-10-CM | POA: Diagnosis not present

## 2023-05-24 DIAGNOSIS — E890 Postprocedural hypothyroidism: Secondary | ICD-10-CM | POA: Diagnosis not present

## 2023-05-24 DIAGNOSIS — I480 Paroxysmal atrial fibrillation: Secondary | ICD-10-CM | POA: Diagnosis not present

## 2023-05-24 DIAGNOSIS — I110 Hypertensive heart disease with heart failure: Secondary | ICD-10-CM | POA: Diagnosis not present

## 2023-05-24 DIAGNOSIS — I509 Heart failure, unspecified: Secondary | ICD-10-CM | POA: Diagnosis not present

## 2023-05-25 ENCOUNTER — Ambulatory Visit: Payer: Medicare Other

## 2023-05-25 ENCOUNTER — Other Ambulatory Visit: Payer: Medicare Other

## 2023-05-25 ENCOUNTER — Ambulatory Visit: Payer: Medicare Other | Admitting: Physician Assistant

## 2023-05-25 DIAGNOSIS — I110 Hypertensive heart disease with heart failure: Secondary | ICD-10-CM | POA: Diagnosis not present

## 2023-05-25 DIAGNOSIS — E890 Postprocedural hypothyroidism: Secondary | ICD-10-CM | POA: Diagnosis not present

## 2023-05-25 DIAGNOSIS — I509 Heart failure, unspecified: Secondary | ICD-10-CM | POA: Diagnosis not present

## 2023-05-25 DIAGNOSIS — I480 Paroxysmal atrial fibrillation: Secondary | ICD-10-CM | POA: Diagnosis not present

## 2023-05-28 DIAGNOSIS — I110 Hypertensive heart disease with heart failure: Secondary | ICD-10-CM | POA: Diagnosis not present

## 2023-05-28 DIAGNOSIS — E890 Postprocedural hypothyroidism: Secondary | ICD-10-CM | POA: Diagnosis not present

## 2023-05-28 DIAGNOSIS — I509 Heart failure, unspecified: Secondary | ICD-10-CM | POA: Diagnosis not present

## 2023-05-28 DIAGNOSIS — I480 Paroxysmal atrial fibrillation: Secondary | ICD-10-CM | POA: Diagnosis not present

## 2023-05-29 ENCOUNTER — Telehealth: Payer: Self-pay

## 2023-05-29 DIAGNOSIS — E890 Postprocedural hypothyroidism: Secondary | ICD-10-CM | POA: Diagnosis not present

## 2023-05-29 DIAGNOSIS — I509 Heart failure, unspecified: Secondary | ICD-10-CM | POA: Diagnosis not present

## 2023-05-29 DIAGNOSIS — I480 Paroxysmal atrial fibrillation: Secondary | ICD-10-CM | POA: Diagnosis not present

## 2023-05-29 DIAGNOSIS — I110 Hypertensive heart disease with heart failure: Secondary | ICD-10-CM | POA: Diagnosis not present

## 2023-05-29 NOTE — Telephone Encounter (Signed)
Last to readings look like the outliers. Continue checking regularly and get back the weights in 7-10 days.  Thanks MJP

## 2023-05-29 NOTE — Telephone Encounter (Signed)
Agree with MP. I am good as long as the weight is down and she is feeling well

## 2023-05-29 NOTE — Telephone Encounter (Signed)
Pt Occupational therapist Called to give her record of weights 149.8, 151.2, 152.1,152.2, 153.6, 154.4, and 147 he was told to report anything below 153 and  anything above 156. Pt B/P was 120/80 and heart rate was 85 this is a [facility] patient

## 2023-05-30 DIAGNOSIS — I509 Heart failure, unspecified: Secondary | ICD-10-CM | POA: Diagnosis not present

## 2023-05-30 DIAGNOSIS — I480 Paroxysmal atrial fibrillation: Secondary | ICD-10-CM | POA: Diagnosis not present

## 2023-05-30 DIAGNOSIS — E890 Postprocedural hypothyroidism: Secondary | ICD-10-CM | POA: Diagnosis not present

## 2023-05-30 DIAGNOSIS — I110 Hypertensive heart disease with heart failure: Secondary | ICD-10-CM | POA: Diagnosis not present

## 2023-05-30 NOTE — Telephone Encounter (Signed)
Called and spoke with patient regarding her weight loss concerns. She will call back on 7-10 days if she has any other concerns.

## 2023-05-31 DIAGNOSIS — I110 Hypertensive heart disease with heart failure: Secondary | ICD-10-CM | POA: Diagnosis not present

## 2023-05-31 DIAGNOSIS — E890 Postprocedural hypothyroidism: Secondary | ICD-10-CM | POA: Diagnosis not present

## 2023-05-31 DIAGNOSIS — I509 Heart failure, unspecified: Secondary | ICD-10-CM | POA: Diagnosis not present

## 2023-05-31 DIAGNOSIS — I480 Paroxysmal atrial fibrillation: Secondary | ICD-10-CM | POA: Diagnosis not present

## 2023-06-01 ENCOUNTER — Inpatient Hospital Stay: Payer: Medicare Other

## 2023-06-01 ENCOUNTER — Inpatient Hospital Stay: Payer: Medicare Other | Attending: Physician Assistant

## 2023-06-01 ENCOUNTER — Other Ambulatory Visit: Payer: Self-pay | Admitting: *Deleted

## 2023-06-01 ENCOUNTER — Ambulatory Visit: Payer: Medicare Other

## 2023-06-01 ENCOUNTER — Ambulatory Visit: Payer: Medicare Other | Admitting: Physician Assistant

## 2023-06-01 ENCOUNTER — Other Ambulatory Visit: Payer: Medicare Other

## 2023-06-01 ENCOUNTER — Inpatient Hospital Stay: Payer: Medicare Other | Admitting: Hematology and Oncology

## 2023-06-01 ENCOUNTER — Other Ambulatory Visit: Payer: Self-pay

## 2023-06-01 VITALS — BP 115/64 | HR 71 | Temp 97.5°F | Resp 15 | Wt 148.2 lb

## 2023-06-01 DIAGNOSIS — R251 Tremor, unspecified: Secondary | ICD-10-CM | POA: Insufficient documentation

## 2023-06-01 DIAGNOSIS — G629 Polyneuropathy, unspecified: Secondary | ICD-10-CM | POA: Insufficient documentation

## 2023-06-01 DIAGNOSIS — Z5112 Encounter for antineoplastic immunotherapy: Secondary | ICD-10-CM | POA: Insufficient documentation

## 2023-06-01 DIAGNOSIS — E538 Deficiency of other specified B group vitamins: Secondary | ICD-10-CM | POA: Insufficient documentation

## 2023-06-01 DIAGNOSIS — C9000 Multiple myeloma not having achieved remission: Secondary | ICD-10-CM

## 2023-06-01 DIAGNOSIS — Z7901 Long term (current) use of anticoagulants: Secondary | ICD-10-CM | POA: Diagnosis not present

## 2023-06-01 DIAGNOSIS — D649 Anemia, unspecified: Secondary | ICD-10-CM | POA: Insufficient documentation

## 2023-06-01 DIAGNOSIS — R059 Cough, unspecified: Secondary | ICD-10-CM | POA: Insufficient documentation

## 2023-06-01 DIAGNOSIS — I959 Hypotension, unspecified: Secondary | ICD-10-CM | POA: Diagnosis not present

## 2023-06-01 DIAGNOSIS — I4891 Unspecified atrial fibrillation: Secondary | ICD-10-CM | POA: Insufficient documentation

## 2023-06-01 LAB — CMP (CANCER CENTER ONLY)
ALT: 13 U/L (ref 0–44)
AST: 11 U/L — ABNORMAL LOW (ref 15–41)
Albumin: 3.8 g/dL (ref 3.5–5.0)
Alkaline Phosphatase: 77 U/L (ref 38–126)
Anion gap: 12 (ref 5–15)
BUN: 25 mg/dL — ABNORMAL HIGH (ref 8–23)
CO2: 29 mmol/L (ref 22–32)
Calcium: 8.7 mg/dL — ABNORMAL LOW (ref 8.9–10.3)
Chloride: 96 mmol/L — ABNORMAL LOW (ref 98–111)
Creatinine: 0.83 mg/dL (ref 0.44–1.00)
GFR, Estimated: >60 mL/min (ref >60)
Glucose, Bld: 149 mg/dL — ABNORMAL HIGH (ref 70–99)
Potassium: 3.5 mmol/L (ref 3.5–5.1)
Sodium: 137 mmol/L (ref 135–145)
Total Bilirubin: 0.9 mg/dL (ref 0.3–1.2)
Total Protein: 5.5 g/dL — ABNORMAL LOW (ref 6.5–8.1)

## 2023-06-01 LAB — CBC WITH DIFFERENTIAL (CANCER CENTER ONLY)
Abs Immature Granulocytes: 0 10*3/uL (ref 0.00–0.07)
Basophils Absolute: 0 10*3/uL (ref 0.0–0.1)
Basophils Relative: 1 %
Eosinophils Absolute: 0.1 10*3/uL (ref 0.0–0.5)
Eosinophils Relative: 2 %
HCT: 41.8 % (ref 36.0–46.0)
Hemoglobin: 13.7 g/dL (ref 12.0–15.0)
Immature Granulocytes: 0 %
Lymphocytes Relative: 10 %
Lymphs Abs: 0.5 10*3/uL — ABNORMAL LOW (ref 0.7–4.0)
MCH: 28.2 pg (ref 26.0–34.0)
MCHC: 32.8 g/dL (ref 30.0–36.0)
MCV: 86 fL (ref 80.0–100.0)
Monocytes Absolute: 0.7 10*3/uL (ref 0.1–1.0)
Monocytes Relative: 14 %
Neutro Abs: 3.6 10*3/uL (ref 1.7–7.7)
Neutrophils Relative %: 73 %
Platelet Count: 96 10*3/uL — ABNORMAL LOW (ref 150–400)
RBC: 4.86 MIL/uL (ref 3.87–5.11)
RDW: 18.8 % — ABNORMAL HIGH (ref 11.5–15.5)
WBC Count: 4.9 10*3/uL (ref 4.0–10.5)
nRBC: 0 % (ref 0.0–0.2)

## 2023-06-01 LAB — LACTATE DEHYDROGENASE: LDH: 220 U/L — ABNORMAL HIGH (ref 98–192)

## 2023-06-01 MED ORDER — DEXAMETHASONE 4 MG PO TABS: 20.0000 mg | ORAL_TABLET | ORAL | 2 refills | Status: DC

## 2023-06-01 MED ORDER — LORATADINE 10 MG PO TABS
10.0000 mg | ORAL_TABLET | Freq: Every day | ORAL | Status: DC
  Administered 2023-06-01: 10 mg via ORAL
  Filled 2023-06-01: qty 1

## 2023-06-01 MED ORDER — DARATUMUMAB-HYALURONIDASE-FIHJ 1800-30000 MG-UT/15ML ~~LOC~~ SOLN
1800.0000 mg | Freq: Once | SUBCUTANEOUS | Status: AC
  Administered 2023-06-01: 1800 mg via SUBCUTANEOUS
  Filled 2023-06-01: qty 15

## 2023-06-01 MED ORDER — DEXAMETHASONE 4 MG PO TABS
20.0000 mg | ORAL_TABLET | Freq: Once | ORAL | Status: AC
  Administered 2023-06-01: 20 mg via ORAL
  Filled 2023-06-01: qty 5

## 2023-06-01 MED ORDER — ACETAMINOPHEN 325 MG PO TABS
650.0000 mg | ORAL_TABLET | Freq: Once | ORAL | Status: AC
  Administered 2023-06-01: 650 mg via ORAL
  Filled 2023-06-01: qty 2

## 2023-06-01 NOTE — Progress Notes (Signed)
[facility] [phone]   [phone]   PROGRESS NOTE   Patient Care Team: [facility], [facility] as PCP - General (Rheumatology)   Hematological/Oncological History # IgG Lambda Multiple Myeloma  06/14/2022: establish care with Zoe [name] due to anemia. Labs showed M protein 4.9, Kappa 7.2, Lambda 10.8, ratio 0.67 07/13/2022: Bmbx showed Lambda restricted plasma cell neoplasm involving approximately 60% of the cellular marrow by IHC on the biopsy.  08/01/2022: Cycle 1 Day 1 of VRd chemotherapy. (Holding revlimid initially) 08/21/2022:  Cycle 2 Day 1 of VRd chemotherapy. (Holding revlimid) 09/04/2022: Cycle 3 Day 1 of VRd chemotherapy. Started revlimid 10/03/2022: Cycle 4 Day 1 of VRd chemotherapy 10/31/2022: Cycle 5 Day 1 of VRd chemotherapy 11/20/2022: Cycle 6 Day 1 of VRd chemotherapy 12/11/2022: Cycle 7 Day 1 of VRd chemotherapy 01/02/2023: Cycle 8 Day 1 of VRd chemotherapy 01/22/2023: Cycle 9 Day 1 of VRd chemotherapy 02/23/2023: Day 1 Cycle 1 of Dara/Rev/Dex 03/23/2023: Day 1 Cycle 2 of Dara/Rev/Dex 05/06/2023: chemotherapy on pause due to chest pain/cardiac/respiratory issues.  05/25/2023: Cycle 3 Day 1 of Dara/Rev/Dex    INTERVAL HISTORY: Zoe [name] 89 y.o. female returns to the clinic today for a follow-up visit accompanied by her daughter-in-law. Her last visit was on 05/04/2023. In the interim, she continued on Dara/Rev/Dex.   [name] reports she has been well overall in the interim since her last visit.  She reports that she is doing physical therapy at home but is interested in potentially participating in physical therapy at a facility.  She notes that she has lost 5 pounds interim since her last visit, though she think she may have been fluid overloaded.  She is she is trying to eat more protein at this time.  She is also drinking protein shakes.  She is taking Lasix on a as needed basis right now.  She notes that she is  tolerating her chemotherapy treatments well with no chest pain or shortness of breath.  She notes her appetite is good though her energy is "fair".  She reports sometimes she feels like she is dragging.  Overall   she is willing and able to proceed with treatment today.  She denies any fevers, chills, sweats, nausea vomiting or diarrhea.  A full 10 point ROS is otherwise negative.  MEDICAL HISTORY: Past Medical History:  Diagnosis Date   Arthritis    some - per patient   Breast cancer (HCC)    breast cancer / left    Cataract    bilat    GERD (gastroesophageal reflux disease)    History of kidney stones    Hyperlipidemia    Hypertension    Hypothyroidism    Macular degeneration    Left   S/P TAVR (transcatheter aortic valve replacement) 09/03/2018   23 mm Edwards Sapien 3 transcatheter heart valve placed via percutaneous right transfemoral approach    Severe aortic stenosis    Stress incontinence    Thyroid disease    Tinnitus     ALLERGIES:  is allergic to quinolones, penicillins, and sulfa antibiotics.  MEDICATIONS:  Current Outpatient Medications  Medication Sig Dispense Refill   acyclovir (ZOVIRAX) 400 MG tablet Take 1 tablet (400 mg total) by mouth 2 (two) times daily. 60 tablet 3   albuterol (VENTOLIN HFA) 108 (90 Base) MCG/ACT inhaler Inhale 2 puffs into the lungs every 6 (six) hours as needed for wheezing or shortness of breath. (Patient not taking: Reported on 05/17/2023) 8 g 2  amiodarone (PACERONE) 200 MG tablet Take 1 tablet (200 mg total) by mouth daily. 45 tablet 1   apixaban (ELIQUIS) 5 MG TABS tablet Take 1 tablet (5 mg total) by mouth 2 (two) times daily. 180 tablet 1   Artificial Tear Solution (SOOTHE XP) SOLN Place 1 drop into both eyes every evening.     Cholecalciferol (VITAMIN D3) 50 MCG (2000 UT) capsule Take 1 capsule (2,000 Units total) by mouth daily.     [employer] VITAMIN B12 1000 MCG tablet TAKE 1 TABLET BY MOUTH EVERY DAY 90 tablet 1   dapagliflozin  propanediol (FARXIGA) 10 MG TABS tablet TAKE 1 TABLET BY MOUTH EVERY DAY 90 tablet 2   dexamethasone (DECADRON) 4 MG tablet Take 5 tablets (20 mg total) by mouth every 14 (fourteen) days. Take in the morning with food on treatment days 30 tablet 2   esomeprazole (NEXIUM) 20 MG capsule Take 20 mg by mouth daily as needed (Heartburn).     lenalidomide (REVLIMID) 25 MG capsule Take 1 capsule (25 mg total) by mouth daily. [employer] Auth # [id number]     Date Obtained 05/18/23 Take one capsule daily for 14 days none for 7 14 capsule 0   levothyroxine (SYNTHROID, LEVOTHROID) 100 MCG tablet Take 100 mcg by mouth daily before breakfast.  2   metoprolol tartrate (LOPRESSOR) 50 MG tablet Take 1 tablet (50 mg total) by mouth 2 (two) times daily. 60 tablet 1   Multiple Vitamins-Minerals (PRESERVISION AREDS) CAPS Take 1 capsule by mouth 2 (two) times daily.     OVER THE COUNTER MEDICATION Take 1 capsule by mouth in the morning and at bedtime. AREDS     polyethylene glycol (MIRALAX / GLYCOLAX) 17 g packet Take 17 g by mouth daily as needed for mild constipation.     pravastatin (PRAVACHOL) 40 MG tablet Take 40 mg by mouth every evening.     sennosides-docusate sodium (SENOKOT-S) 8.6-50 MG tablet Take 1 tablet by mouth daily as needed for constipation.     No current facility-administered medications for this visit.   Facility-Administered Medications Ordered in Other Visits  Medication Dose Route Frequency Provider Last Rate Last Admin   loratadine (CLARITIN) tablet 10 mg  10 mg Oral Daily Jaci Standard, MD   10 mg at 06/01/23 1045    SURGICAL HISTORY:  Past Surgical History:  Procedure Laterality Date   ABDOMINAL HYSTERECTOMY  1970's   BACK SURGERY     BREAST LUMPECTOMY  12/1998   lumpectomy   CARDIAC CATHETERIZATION     CARDIOVERSION N/A 04/18/2023   Procedure: CARDIOVERSION;  Surgeon: Tessa Lerner, DO;  Location: [facility] INVASIVE CV LAB;  Service: Cardiovascular;  Laterality: N/A;   CARDIOVERSION N/A  04/20/2023   Procedure: CARDIOVERSION;  Surgeon: Yates Decamp, MD;  Location: [facility] INVASIVE CV LAB;  Service: Cardiovascular;  Laterality: N/A;   EYE SURGERY     cataract surgery bilat    INTRAOPERATIVE TRANSTHORACIC ECHOCARDIOGRAM N/A 09/03/2018   Procedure: INTRAOPERATIVE TRANSTHORACIC ECHOCARDIOGRAM;  Surgeon: Kathleene Hazel, MD;  Location: [facility] OR;  Service: Open Heart Surgery;  Laterality: N/A;   KYPHOPLASTY N/A 09/07/2022   Procedure: THORACIC EIGHT KYPHOPLASTY;  Surgeon: Venita Lick, MD;  Location: [facility] OR;  Service: Orthopedics;  Laterality: N/A;  1 hr Local with [facility] 3 C-Bed   LITHOTRIPSY     Right total knee     2018 Dr. Charlann Boxer   RIGHT/LEFT HEART CATH AND CORONARY ANGIOGRAPHY N/A 08/06/2018   Procedure: RIGHT/LEFT HEART CATH AND  CORONARY ANGIOGRAPHY;  Surgeon: Yates Decamp, MD;  Location: [facility] INVAS[facility] CV LAB;  Service: Cardiovascular;  Laterality: N/A;   THYROIDECTOMY, PARTIAL  1975   TONSILLECTOMY     as a child - patient not sure of exact date   TOTAL KNEE ARTHROPLASTY Left 03/13/2016   Procedure: TOTAL KNEE ARTHROPLASTY;  Surgeon: Durene Romans, MD;  Location: [facility] ORS;  Service: Orthopedics;  Laterality: Left;   TOTAL KNEE ARTHROPLASTY Right 06/18/2017   Procedure: RIGHT TOTAL KNEE ARTHROPLASTY;  Surgeon: Durene Romans, MD;  Location: [facility] ORS;  Service: Orthopedics;  Laterality: Right;   TRANSCATHETER AORTIC VALVE REPLACEMENT, TRANSFEMORAL N/A 09/03/2018   Procedure: TRANSCATHETER AORTIC VALVE REPLACEMENT, TRANSFEMORAL;  Surgeon: Kathleene Hazel, MD;  Location: [facility] OR;  Service: Open Heart Surgery;  Laterality: N/A;   REVIEW OF SYSTEMS:   Constitutional: ( - ) fevers, ( - )  chills , ( - ) night sweats Eyes: ( - ) blurriness of vision, ( - ) double vision, ( - ) watery eyes Ears, nose, mouth, throat, and face: ( - ) mucositis, ( - ) sore throat Respiratory: ( +) cough, ( - ) dyspnea, ( - ) wheezes Cardiovascular: ( - ) palpitation, ( - ) chest discomfort, ( +) lower  extremity swelling Gastrointestinal:  ( - ) nausea, ( - ) heartburn, ( - ) change in bowel habits Skin: ( - ) abnormal skin rashes Lymphatics: ( - ) new lymphadenopathy, ( - ) easy bruising Neurological: ( - ) numbness, ( - ) tingling, ( - ) new weaknesses Behavioral/Psych: ( - ) mood change, ( - ) new changes  All other systems were reviewed with the patient and are negative.   PHYSICAL EXAMINATION:  Blood pressure 115/64, pulse 71, temperature (!) 97.5 F (36.4 C), temperature source Temporal, resp. rate 15, weight 148 lb 3.2 oz (67.2 kg), SpO2 99%.  ECOG PERFORMANCE STATUS: 1  GENERAL: well appearing elderly Caucasian female, alert, no distress and comfortable SKIN: skin color, texture, turgor are normal, no rashes or significant lesions EYES: conjunctiva are pink and non-injected, sclera clear LUNGS:normal breathing effort. Mild wheezing and crackles in lower lobes b/l.  HEART: irregular rhythm ,normal rate and no murmurs. +1 bilateral lower extremity edema starting below the knee to ankles.  Musculoskeletal: no cyanosis of digits and no clubbing  PSYCH: alert & oriented x 3, fluent speech NEURO: no focal motor/sensory deficits    LABORATORY DATA: Lab Results  Component Value Date   WBC 4.9 06/01/2023   HGB 13.7 06/01/2023   HCT 41.8 06/01/2023   MCV 86.0 06/01/2023   PLT 96 (L) 06/01/2023      Chemistry      Component Value Date/Time   NA 137 06/01/2023 0911   NA 130 (L) 06/19/2022 1012   K 3.5 06/01/2023 0911   CL 96 (L) 06/01/2023 0911   CO2 29 06/01/2023 0911   BUN 25 (H) 06/01/2023 0911   BUN 21 06/19/2022 1012   CREATININE 0.83 06/01/2023 0911      Component Value Date/Time   CALCIUM 8.7 (L) 06/01/2023 0911   ALKPHOS 77 06/01/2023 0911   AST 11 (L) 06/01/2023 0911   ALT 13 06/01/2023 0911   BILITOT 0.9 06/01/2023 0911       RADIOGRAPHIC STUDIES:  No results found.   ASSESSMENT/PLAN:  Zoe [name] is a 89 y.o. female who presents to the  clinic for evaluation for IgG lambda multiple myeloma.   # IgG Lambda Multiple Myeloma  --diagnosis of  MM confirmed with bone marrow biopsy showing 60% plasma cells and anemia -- Bone survey shows no lytic lesions, kidney function is within normal limits.  --08/01/2022 was Cycle 1 Day 1 of VRd  -Dr. Leonides Schanz discontinued velcade on 02/05/23 due to rash. She is status post day 15 cycle #9.  -Dr. Leonides Schanz changed the care plan to Daratumumab, Revlimid, and decadron. She is scheduled for day 1 cycle #1 today  -Started Cycle 1, Day 1 of Dara/Rev/Dex on 02/23/2023.  Plan:  --Due for Cycle 3 Day 1 of Dara/Rev/Dex today --Labs show white blood cell count white blood cell 4.9, hemoglobin 13.7, MCV 86, and platelets of 96 --last M protein from 05/04/2023 trended down to 0.2 (4.9 prior to start of therapy). Normalized SFLC.  --RTC in 2 weeks with interval weekly treatment.   #Cough: --Chest xray from 03/23/2023 was negative for infectious process.  --Currently taking tessalon perles 100 mg TID with some improvement. --No fever and cough is mainly dry with occasional clear sputum. Cough worsens in supine position. --Encouraged to take mucinex and clairitin.  Patient scheduled for an upcoming appointment with pulmonology.  # Vaginal itching: --Sent prescription for nystatin powder    #Hypotension/BP/Atrial Fibrillation  --Follows with cardiology, Dr. Jacinto Halim -- Recommendations include metoprolol succinate 25 mg PO daily and restarted patient back on amiodarone 200 mg PO daily x 10 days followed by 12 mg daily.  --Continue on Eliquis 5 mg BID   #Bilateral lower extremity edema:  --Continue to wear compression stockings.    # Normocytic Anemia #Vitamin B12 Deficiency --Anemia likely driven by multiple myeloma, but may be component of vitamin B12 deficiency as well. --initial labs showed elevated MMA with B12 180 -- Continue vitamin B12 1000 mcg p.o. daily -- Continue to monitor   #Left leg/thigh  neuropathy--improving: --Currently on gabapentin 900 mg nightly and 600 mg in the morning --Encouraged to try water aerobics and stretches.  --Following with Dr. Debbe Bales (ortho) to see if she is a candidate for steroid injections.  -- Patient evaluated by Dr. Barbaraann Cao thinks that this may be neuropathy worsened by her Velcade therapy.   #Episode of right UE tremor and speech abnormality: --Evaluated by Dr. Barbaraann Cao on 03/20/2023. Presumed etiology had been atrial fibrillation, but vascular disease, atheromatous changes, are also possibly implicated concurrently.  --MRI brain from 03/13/2023 show no acute finding including no evidence of intracranial neoplastic disease. There are chronic small-vessel ischemic changes of the pons, cerebral hemispheric white matter and right cerebellum. --Educated patient to seek immediate evaluate if there are repeat episodes. --No neurological deficits identified per exam today  #Supportive Care -- chemotherapy education complete.  -- port placement not required.  --Awaiting to start Zometa/Xgeva  -- zofran 8mg  q8H PRN and compazine 10mg  PO q6H for nausea -- acyclovir 400mg  PO BID for VCZ prophylaxis -- tylenol 1000 mg q8H PRN for back pain.   No orders of the defined types were placed in this encounter.   Patient expressed understanding of the plan provided.   I have spent a total of 30 minutes minutes of face-to-face and non-face-to-face time, preparing to see the patient, performing a medically appropriate examination, counseling and educating the patient, ordering tests/procedures, documenting clinical information in the electronic health record, and care coordination.   Ulysees Barns, MD Department of Hematology/Oncology [facility] Phone: [phone] Pager: [phone] Email: [email or web address]

## 2023-06-01 NOTE — Progress Notes (Signed)
OK to treat with platelets <100k  Per Dr Jenell Milliner, PharmD

## 2023-06-01 NOTE — Patient Instructions (Signed)
[facility]  Discharge Instructions: Thank you for choosing [facility] to provide your oncology and hematology care.   If you have a lab appointment with the Cancer Center, please go directly to the Cancer Center and check in at the registration area.   Wear comfortable clothing and clothing appropriate for easy access to any Portacath or PICC line.   We strive to give you quality time with your provider. You may need to reschedule your appointment if you arrive late (15 or more minutes).  Arriving late affects you and other patients whose appointments are after yours.  Also, if you miss three or more appointments without notifying the office, you may be dismissed from the clinic at the provider's discretion.      For prescription refill requests, have your pharmacy contact our office and allow 72 hours for refills to be completed.    Today you received the following chemotherapy and/or immunotherapy agents darzalex faspro      To help prevent nausea and vomiting after your treatment, we encourage you to take your nausea medication as directed.  BELOW ARE SYMPTOMS THAT SHOULD BE REPORTED IMMEDIATELY: *FEVER GREATER THAN 100.4 F (38 C) OR HIGHER *CHILLS OR SWEATING *NAUSEA AND VOMITING THAT IS NOT CONTROLLED WITH YOUR NAUSEA MEDICATION *UNUSUAL SHORTNESS OF BREATH *UNUSUAL BRUISING OR BLEEDING *URINARY PROBLEMS (pain or burning when urinating, or frequent urination) *BOWEL PROBLEMS (unusual diarrhea, constipation, pain near the anus) TENDERNESS IN MOUTH AND THROAT WITH OR WITHOUT PRESENCE OF ULCERS (sore throat, sores in mouth, or a toothache) UNUSUAL RASH, SWELLING OR PAIN  UNUSUAL VAGINAL DISCHARGE OR ITCHING   Items with * indicate a potential emergency and should be followed up as soon as possible or go to the Emergency Department if any problems should occur.  Please show the CHEMOTHERAPY ALERT CARD or IMMUNOTHERAPY ALERT CARD at  check-in to the Emergency Department and triage nurse.  Should you have questions after your visit or need to cancel or reschedule your appointment, please contact [facility]  Dept: [phone]  and follow the prompts.  Office hours are 8:00 a.m. to 4:30 p.m. Monday - Friday. Please note that voicemails left after 4:00 p.m. may not be returned until the following business day.  We are closed weekends and major holidays. You have access to a nurse at all times for urgent questions. Please call the main number to the clinic Dept: [phone] and follow the prompts.   For any non-urgent questions, you may also contact your provider using MyChart. We now offer e-Visits for anyone 18 and older to request care online for non-urgent symptoms. For details visit [email or web address].   Also download the MyChart app! Go to the app store, search "MyChart", open the app, select [place], and log in with your MyChart username and password.  

## 2023-06-02 ENCOUNTER — Other Ambulatory Visit: Payer: Self-pay | Admitting: Cardiology

## 2023-06-04 ENCOUNTER — Other Ambulatory Visit: Payer: Self-pay | Admitting: Cardiology

## 2023-06-04 DIAGNOSIS — I480 Paroxysmal atrial fibrillation: Secondary | ICD-10-CM

## 2023-06-04 DIAGNOSIS — I110 Hypertensive heart disease with heart failure: Secondary | ICD-10-CM | POA: Diagnosis not present

## 2023-06-04 DIAGNOSIS — I509 Heart failure, unspecified: Secondary | ICD-10-CM | POA: Diagnosis not present

## 2023-06-04 DIAGNOSIS — E890 Postprocedural hypothyroidism: Secondary | ICD-10-CM | POA: Diagnosis not present

## 2023-06-06 ENCOUNTER — Other Ambulatory Visit: Payer: Self-pay | Admitting: *Deleted

## 2023-06-06 MED ORDER — LENALIDOMIDE 25 MG PO CAPS: 25.0000 mg | ORAL_CAPSULE | Freq: Every day | ORAL | 0 refills | Status: DC

## 2023-06-07 ENCOUNTER — Other Ambulatory Visit: Payer: Medicare Other

## 2023-06-07 ENCOUNTER — Ambulatory Visit: Payer: Medicare Other

## 2023-06-07 ENCOUNTER — Ambulatory Visit: Payer: Medicare Other | Admitting: Hematology and Oncology

## 2023-06-13 ENCOUNTER — Telehealth: Payer: Self-pay

## 2023-06-13 DIAGNOSIS — I509 Heart failure, unspecified: Secondary | ICD-10-CM | POA: Diagnosis not present

## 2023-06-13 DIAGNOSIS — I480 Paroxysmal atrial fibrillation: Secondary | ICD-10-CM | POA: Diagnosis not present

## 2023-06-13 DIAGNOSIS — E890 Postprocedural hypothyroidism: Secondary | ICD-10-CM | POA: Diagnosis not present

## 2023-06-13 DIAGNOSIS — I110 Hypertensive heart disease with heart failure: Secondary | ICD-10-CM | POA: Diagnosis not present

## 2023-06-13 NOTE — Telephone Encounter (Signed)
Onalee Hua called from [facility] stating that patient blood pressure was 170/70 but patient was Asymptomatic

## 2023-06-13 NOTE — Telephone Encounter (Signed)
Unless BP is high per patient to continue to observe

## 2023-06-15 ENCOUNTER — Inpatient Hospital Stay: Payer: Medicare Other | Admitting: Hematology and Oncology

## 2023-06-15 ENCOUNTER — Inpatient Hospital Stay: Payer: Medicare Other

## 2023-06-15 ENCOUNTER — Ambulatory Visit: Payer: Medicare Other

## 2023-06-15 ENCOUNTER — Ambulatory Visit: Payer: Medicare Other | Admitting: Hematology and Oncology

## 2023-06-15 ENCOUNTER — Other Ambulatory Visit: Payer: Medicare Other

## 2023-06-15 DIAGNOSIS — C9000 Multiple myeloma not having achieved remission: Secondary | ICD-10-CM

## 2023-06-15 DIAGNOSIS — Z5112 Encounter for antineoplastic immunotherapy: Secondary | ICD-10-CM | POA: Diagnosis not present

## 2023-06-15 DIAGNOSIS — E538 Deficiency of other specified B group vitamins: Secondary | ICD-10-CM | POA: Diagnosis not present

## 2023-06-15 DIAGNOSIS — I959 Hypotension, unspecified: Secondary | ICD-10-CM | POA: Diagnosis not present

## 2023-06-15 DIAGNOSIS — D649 Anemia, unspecified: Secondary | ICD-10-CM | POA: Diagnosis not present

## 2023-06-15 DIAGNOSIS — G629 Polyneuropathy, unspecified: Secondary | ICD-10-CM | POA: Diagnosis not present

## 2023-06-15 DIAGNOSIS — Z7901 Long term (current) use of anticoagulants: Secondary | ICD-10-CM | POA: Diagnosis not present

## 2023-06-15 DIAGNOSIS — R059 Cough, unspecified: Secondary | ICD-10-CM | POA: Diagnosis not present

## 2023-06-15 DIAGNOSIS — I4891 Unspecified atrial fibrillation: Secondary | ICD-10-CM | POA: Diagnosis not present

## 2023-06-15 DIAGNOSIS — R251 Tremor, unspecified: Secondary | ICD-10-CM | POA: Diagnosis not present

## 2023-06-15 LAB — CMP (CANCER CENTER ONLY)
ALT: 13 U/L (ref 0–44)
AST: 14 U/L — ABNORMAL LOW (ref 15–41)
Albumin: 3.8 g/dL (ref 3.5–5.0)
Alkaline Phosphatase: 78 U/L (ref 38–126)
Anion gap: 8 (ref 5–15)
BUN: 22 mg/dL (ref 8–23)
CO2: 29 mmol/L (ref 22–32)
Calcium: 8.9 mg/dL (ref 8.9–10.3)
Chloride: 99 mmol/L (ref 98–111)
Creatinine: 0.86 mg/dL (ref 0.44–1.00)
GFR, Estimated: >60 mL/min (ref >60)
Glucose, Bld: 117 mg/dL — ABNORMAL HIGH (ref 70–99)
Potassium: 3.4 mmol/L — ABNORMAL LOW (ref 3.5–5.1)
Sodium: 136 mmol/L (ref 135–145)
Total Bilirubin: 0.5 mg/dL (ref 0.3–1.2)
Total Protein: 6.1 g/dL — ABNORMAL LOW (ref 6.5–8.1)

## 2023-06-15 LAB — CBC WITH DIFFERENTIAL (CANCER CENTER ONLY)
Abs Immature Granulocytes: 0.02 10*3/uL (ref 0.00–0.07)
Basophils Absolute: 0 10*3/uL (ref 0.0–0.1)
Basophils Relative: 1 %
Eosinophils Absolute: 0.1 10*3/uL (ref 0.0–0.5)
Eosinophils Relative: 1 %
HCT: 43.2 % (ref 36.0–46.0)
Hemoglobin: 13.7 g/dL (ref 12.0–15.0)
Immature Granulocytes: 0 %
Lymphocytes Relative: 5 %
Lymphs Abs: 0.3 10*3/uL — ABNORMAL LOW (ref 0.7–4.0)
MCH: 28 pg (ref 26.0–34.0)
MCHC: 31.7 g/dL (ref 30.0–36.0)
MCV: 88.2 fL (ref 80.0–100.0)
Monocytes Absolute: 0.7 10*3/uL (ref 0.1–1.0)
Monocytes Relative: 11 %
Neutro Abs: 4.9 10*3/uL (ref 1.7–7.7)
Neutrophils Relative %: 82 %
Platelet Count: 144 10*3/uL — ABNORMAL LOW (ref 150–400)
RBC: 4.9 MIL/uL (ref 3.87–5.11)
RDW: 17.9 % — ABNORMAL HIGH (ref 11.5–15.5)
WBC Count: 6 10*3/uL (ref 4.0–10.5)
nRBC: 0 % (ref 0.0–0.2)

## 2023-06-15 LAB — LACTATE DEHYDROGENASE: LDH: 231 U/L — ABNORMAL HIGH (ref 98–192)

## 2023-06-15 MED ORDER — LORATADINE 10 MG PO TABS
10.0000 mg | ORAL_TABLET | Freq: Every day | ORAL | Status: DC
  Administered 2023-06-15: 10 mg via ORAL
  Filled 2023-06-15: qty 1

## 2023-06-15 MED ORDER — ACETAMINOPHEN 325 MG PO TABS: 650.0000 mg | ORAL_TABLET | Freq: Once | ORAL | Status: DC

## 2023-06-15 MED ORDER — DARATUMUMAB-HYALURONIDASE-FIHJ 1800-30000 MG-UT/15ML ~~LOC~~ SOLN
1800.0000 mg | Freq: Once | SUBCUTANEOUS | Status: AC
  Administered 2023-06-15: 1800 mg via SUBCUTANEOUS
  Filled 2023-06-15: qty 15

## 2023-06-15 NOTE — Patient Instructions (Signed)
[facility]  Discharge Instructions: Thank you for choosing [facility] to provide your oncology and hematology care.   If you have a lab appointment with the Cancer Center, please go directly to the Cancer Center and check in at the registration area.   Wear comfortable clothing and clothing appropriate for easy access to any Portacath or PICC line.   We strive to give you quality time with your provider. You may need to reschedule your appointment if you arrive late (15 or more minutes).  Arriving late affects you and other patients whose appointments are after yours.  Also, if you miss three or more appointments without notifying the office, you may be dismissed from the clinic at the provider's discretion.      For prescription refill requests, have your pharmacy contact our office and allow 72 hours for refills to be completed.    Today you received the following chemotherapy and/or immunotherapy agents darzalex faspro      To help prevent nausea and vomiting after your treatment, we encourage you to take your nausea medication as directed.  BELOW ARE SYMPTOMS THAT SHOULD BE REPORTED IMMEDIATELY: *FEVER GREATER THAN 100.4 F (38 C) OR HIGHER *CHILLS OR SWEATING *NAUSEA AND VOMITING THAT IS NOT CONTROLLED WITH YOUR NAUSEA MEDICATION *UNUSUAL SHORTNESS OF BREATH *UNUSUAL BRUISING OR BLEEDING *URINARY PROBLEMS (pain or burning when urinating, or frequent urination) *BOWEL PROBLEMS (unusual diarrhea, constipation, pain near the anus) TENDERNESS IN MOUTH AND THROAT WITH OR WITHOUT PRESENCE OF ULCERS (sore throat, sores in mouth, or a toothache) UNUSUAL RASH, SWELLING OR PAIN  UNUSUAL VAGINAL DISCHARGE OR ITCHING   Items with * indicate a potential emergency and should be followed up as soon as possible or go to the Emergency Department if any problems should occur.  Please show the CHEMOTHERAPY ALERT CARD or IMMUNOTHERAPY ALERT CARD at  check-in to the Emergency Department and triage nurse.  Should you have questions after your visit or need to cancel or reschedule your appointment, please contact [facility]  Dept: [phone]  and follow the prompts.  Office hours are 8:00 a.m. to 4:30 p.m. Monday - Friday. Please note that voicemails left after 4:00 p.m. may not be returned until the following business day.  We are closed weekends and major holidays. You have access to a nurse at all times for urgent questions. Please call the main number to the clinic Dept: [phone] and follow the prompts.   For any non-urgent questions, you may also contact your provider using MyChart. We now offer e-Visits for anyone 18 and older to request care online for non-urgent symptoms. For details visit [email or web address].   Also download the MyChart app! Go to the app store, search "MyChart", open the app, select [place], and log in with your MyChart username and password.  

## 2023-06-15 NOTE — Progress Notes (Signed)
[facility] [phone]   [phone]   PROGRESS NOTE   Patient Care Team: [facility], [facility] as PCP - General (Rheumatology)   Hematological/Oncological History # IgG Lambda Multiple Myeloma  06/14/2022: establish care with Zoe [name] due to anemia. Labs showed M protein 4.9, Kappa 7.2, Lambda 10.8, ratio 0.67 07/13/2022: Bmbx showed Lambda restricted plasma cell neoplasm involving approximately 60% of the cellular marrow by IHC on the biopsy.  08/01/2022: Cycle 1 Day 1 of VRd chemotherapy. (Holding revlimid initially) 08/21/2022:  Cycle 2 Day 1 of VRd chemotherapy. (Holding revlimid) 09/04/2022: Cycle 3 Day 1 of VRd chemotherapy. Started revlimid 10/03/2022: Cycle 4 Day 1 of VRd chemotherapy 10/31/2022: Cycle 5 Day 1 of VRd chemotherapy 11/20/2022: Cycle 6 Day 1 of VRd chemotherapy 12/11/2022: Cycle 7 Day 1 of VRd chemotherapy 01/02/2023: Cycle 8 Day 1 of VRd chemotherapy 01/22/2023: Cycle 9 Day 1 of VRd chemotherapy 02/23/2023: Day 1 Cycle 1 of Dara/Rev/Dex 03/23/2023: Day 1 Cycle 2 of Dara/Rev/Dex 05/06/2023: chemotherapy on pause due to chest pain/cardiac/respiratory issues.  05/18/2023: Cycle 3 Day 1 of Dara/Rev/Dex 06/15/2023: Cycle 4 Day 1 of Dara/Rev/Dex    INTERVAL HISTORY: Zoe [name] 89 y.o. female returns to the clinic today for a follow-up visit accompanied by her daughter-in-law. Her last visit was on 06/01/2023. In the interim, she continued on Dara/Rev/Dex.   [name] reports she needs to have difficulty with pain in her right leg though she has been trying to walk more.  She reports her right leg tends to flareup unfortunately sometimes in the middle the night such as at 2 AM.  She reports that for 5 to 6 days she feels like there has been some slight worsening recently.  She notes that there has not been much swelling in her legs as she is currently taking Lasix therapy.  She reports that she does have some shortness of  breath in the morning sometimes, particularly when walking to the restroom.  Otherwise she is tolerating her Darzalex therapy well without any difficulty.  She notes she is willing and able to continue treatment at this time.  She denies any fevers, chills, sweats, nausea vomiting or diarrhea.  A full 10 point ROS is otherwise negative.  MEDICAL HISTORY: Past Medical History:  Diagnosis Date   Arthritis    some - per patient   Breast cancer (HCC)    breast cancer / left    Cataract    bilat    GERD (gastroesophageal reflux disease)    History of kidney stones    Hyperlipidemia    Hypertension    Hypothyroidism    Macular degeneration    Left   S/P TAVR (transcatheter aortic valve replacement) 09/03/2018   23 mm Edwards Sapien 3 transcatheter heart valve placed via percutaneous right transfemoral approach    Severe aortic stenosis    Stress incontinence    Thyroid disease    Tinnitus     ALLERGIES:  is allergic to quinolones, penicillins, and sulfa antibiotics.  MEDICATIONS:  Current Outpatient Medications  Medication Sig Dispense Refill   acyclovir (ZOVIRAX) 400 MG tablet Take 1 tablet (400 mg total) by mouth 2 (two) times daily. 60 tablet 3   albuterol (VENTOLIN HFA) 108 (90 Base) MCG/ACT inhaler Inhale 2 puffs into the lungs every 6 (six) hours as needed for wheezing or shortness of breath. (Patient not taking: Reported on 05/17/2023) 8 g 2   amiodarone (PACERONE) 200 MG tablet Take 1 tablet (200 mg  total) by mouth daily. 45 tablet 1   apixaban (ELIQUIS) 5 MG TABS tablet Take 1 tablet (5 mg total) by mouth 2 (two) times daily. 180 tablet 1   Artificial Tear Solution (SOOTHE XP) SOLN Place 1 drop into both eyes every evening.     Cholecalciferol (VITAMIN D3) 50 MCG (2000 UT) capsule Take 1 capsule (2,000 Units total) by mouth daily.     [employer] VITAMIN B12 1000 MCG tablet TAKE 1 TABLET BY MOUTH EVERY DAY 90 tablet 1   dapagliflozin propanediol (FARXIGA) 10 MG TABS tablet TAKE 1  TABLET BY MOUTH EVERY DAY 90 tablet 2   dexamethasone (DECADRON) 4 MG tablet Take 5 tablets (20 mg total) by mouth every 14 (fourteen) days. Take in the morning with food on treatment days 30 tablet 2   esomeprazole (NEXIUM) 20 MG capsule Take 20 mg by mouth daily as needed (Heartburn).     lenalidomide (REVLIMID) 25 MG capsule Take 1 capsule (25 mg total) by mouth daily. [employer] Auth #  [id number]   Date Obtained 06/06/23 Take one capsule daily for 14 days none for 7 14 capsule 0   levothyroxine (SYNTHROID, LEVOTHROID) 100 MCG tablet Take 100 mcg by mouth daily before breakfast.  2   metoprolol tartrate (LOPRESSOR) 50 MG tablet Take 1 tablet (50 mg total) by mouth 2 (two) times daily. 60 tablet 1   Multiple Vitamins-Minerals (PRESERVISION AREDS) CAPS Take 1 capsule by mouth 2 (two) times daily.     OVER THE COUNTER MEDICATION Take 1 capsule by mouth in the morning and at bedtime. AREDS     polyethylene glycol (MIRALAX / GLYCOLAX) 17 g packet Take 17 g by mouth daily as needed for mild constipation.     pravastatin (PRAVACHOL) 40 MG tablet Take 40 mg by mouth every evening.     sennosides-docusate sodium (SENOKOT-S) 8.6-50 MG tablet Take 1 tablet by mouth daily as needed for constipation.     No current facility-administered medications for this visit.    SURGICAL HISTORY:  Past Surgical History:  Procedure Laterality Date   ABDOMINAL HYSTERECTOMY  1970's   BACK SURGERY     BREAST LUMPECTOMY  12/1998   lumpectomy   CARDIAC CATHETERIZATION     CARDIOVERSION N/A 04/18/2023   Procedure: CARDIOVERSION;  Surgeon: Tessa Lerner, DO;  Location: [facility] INVASIVE CV LAB;  Service: Cardiovascular;  Laterality: N/A;   CARDIOVERSION N/A 04/20/2023   Procedure: CARDIOVERSION;  Surgeon: Yates Decamp, MD;  Location: [facility] INVASIVE CV LAB;  Service: Cardiovascular;  Laterality: N/A;   EYE SURGERY     cataract surgery bilat    INTRAOPERATIVE TRANSTHORACIC ECHOCARDIOGRAM N/A 09/03/2018   Procedure: INTRAOPERATIVE  TRANSTHORACIC ECHOCARDIOGRAM;  Surgeon: Kathleene Hazel, MD;  Location[facility];  Service: Open Heart Surgery;  Laterality: N/A;   KYPHOPLASTY N/A 09/07/2022   Procedure: THORACIC EIGHT KYPHOPLASTY;  Surgeon: Venita Lick, MD;  Location: [facility] OR;  Service: Orthopedics;  Laterality: N/A;  1 hr Local with [facility] 3 C-Bed   LITHOTRIPSY     Right total knee     2018 Dr. Charlann Boxer   RIGHT/LEFT HEART CATH AND CORONARY ANGIOGRAPHY N/A 08/06/2018   Procedure: RIGHT/LEFT HEART CATH AND CORONARY ANGIOGRAPHY;  Surgeon: Yates Decamp, MD;  Location: [facility] INVASIVE CV LAB;  Service: Cardiovascular;  Laterality: N/A;   THYROIDECTOMY, PARTIAL  1975   TONSILLECTOMY     as a child - patient not sure of exact date   TOTAL KNEE ARTHROPLASTY Left 03/13/2016   Procedure: TOTAL  KNEE ARTHROPLASTY;  Surgeon: Durene Romans, MD;  Location: [facility] ORS;  Service: Orthopedics;  Laterality: Left;   TOTAL KNEE ARTHROPLASTY Right 06/18/2017   Procedure: RIGHT TOTAL KNEE ARTHROPLASTY;  Surgeon: Durene Romans, MD;  Location: [facility] ORS;  Service: Orthopedics;  Laterality: Right;   TRANSCATHETER AORTIC VALVE REPLACEMENT, TRANSFEMORAL N/A 09/03/2018   Procedure: TRANSCATHETER AORTIC VALVE REPLACEMENT, TRANSFEMORAL;  Surgeon: Kathleene Hazel, MD;  Location: [facility] OR;  Service: Open Heart Surgery;  Laterality: N/A;   REVIEW OF SYSTEMS:   Constitutional: ( - ) fevers, ( - )  chills , ( - ) night sweats Eyes: ( - ) blurriness of vision, ( - ) double vision, ( - ) watery eyes Ears, nose, mouth, throat, and face: ( - ) mucositis, ( - ) sore throat Respiratory: ( +) cough, ( - ) dyspnea, ( - ) wheezes Cardiovascular: ( - ) palpitation, ( - ) chest discomfort, ( +) lower extremity swelling Gastrointestinal:  ( - ) nausea, ( - ) heartburn, ( - ) change in bowel habits Skin: ( - ) abnormal skin rashes Lymphatics: ( - ) new lymphadenopathy, ( - ) easy bruising Neurological: ( - ) numbness, ( - ) tingling, ( - ) new  weaknesses Behavioral/Psych: ( - ) mood change, ( - ) new changes  All other systems were reviewed with the patient and are negative.   PHYSICAL EXAMINATION:  Blood pressure (!) 142/65, pulse 62, temperature 98.1 F (36.7 C), temperature source Temporal, resp. rate 13, weight 148 lb (67.1 kg), SpO2 98%.  ECOG PERFORMANCE STATUS: 1  GENERAL: well appearing elderly Caucasian female, alert, no distress and comfortable SKIN: skin color, texture, turgor are normal, no rashes or significant lesions EYES: conjunctiva are pink and non-injected, sclera clear LUNGS:normal breathing effort. Mild wheezing and crackles in lower lobes b/l.  HEART: irregular rhythm ,normal rate and no murmurs. +1 bilateral lower extremity edema starting below the knee to ankles.  Musculoskeletal: no cyanosis of digits and no clubbing  PSYCH: alert & oriented x 3, fluent speech NEURO: no focal motor/sensory deficits    LABORATORY DATA: Lab Results  Component Value Date   WBC 6.0 06/15/2023   HGB 13.7 06/15/2023   HCT 43.2 06/15/2023   MCV 88.2 06/15/2023   PLT 144 (L) 06/15/2023      Chemistry      Component Value Date/Time   NA 136 06/15/2023 0904   NA 130 (L) 06/19/2022 1012   K 3.4 (L) 06/15/2023 0904   CL 99 06/15/2023 0904   CO2 29 06/15/2023 0904   BUN 22 06/15/2023 0904   BUN 21 06/19/2022 1012   CREATININE 0.86 06/15/2023 0904      Component Value Date/Time   CALCIUM 8.9 06/15/2023 0904   ALKPHOS 78 06/15/2023 0904   AST 14 (L) 06/15/2023 0904   ALT 13 06/15/2023 0904   BILITOT 0.5 06/15/2023 0904       RADIOGRAPHIC STUDIES:  No results found.   ASSESSMENT/PLAN:  Zoe [name] is a 89 y.o. female who presents to the clinic for evaluation for IgG lambda multiple myeloma.   # IgG Lambda Multiple Myeloma  --diagnosis of MM confirmed with bone marrow biopsy showing 60% plasma cells and anemia -- Bone survey shows no lytic lesions, kidney function is within normal limits.   --08/01/2022 was Cycle 1 Day 1 of VRd  -Dr. Leonides Schanz discontinued velcade on 02/05/23 due to rash. She is status post day 15 cycle #9.  -Dr. Leonides Schanz changed the  care plan to Daratumumab, Revlimid, and decadron. She is scheduled for day 1 cycle #1 today  -Started Cycle 1, Day 1 of Dara/Rev/Dex on 02/23/2023.  Plan:  --Due for Cycle 4 Day 1 of Dara/Rev/Dex today --Labs show white blood cell count white blood cell 6.0, hemoglobin 13.7, MCV 88.2, and platelets of 144 --last M protein from 06/15/2023 trended down to 0.1 (4.9 prior to start of therapy). Normalized SFLC.  --RTC in 2 weeks with interval weekly treatment.   #Cough: --Chest xray from 03/23/2023 was negative for infectious process.  --Currently taking tessalon perles 100 mg TID with some improvement. --No fever and cough is mainly dry with occasional clear sputum. Cough worsens in supine position. --Encouraged to take mucinex and clairitin.  Patient scheduled for an upcoming appointment with pulmonology.  # Vaginal itching: --Sent prescription for nystatin powder    #Hypotension/BP/Atrial Fibrillation  --Follows with cardiology, Dr. Jacinto Halim -- Recommendations include metoprolol succinate 25 mg PO daily and restarted patient back on amiodarone 200 mg PO daily x 10 days followed by 12 mg daily.  --Continue on Eliquis 5 mg BID   #Bilateral lower extremity edema:  --Continue to wear compression stockings.    # Normocytic Anemia #Vitamin B12 Deficiency --Anemia likely driven by multiple myeloma, but may be component of vitamin B12 deficiency as well. --initial labs showed elevated MMA with B12 180 -- Continue vitamin B12 1000 mcg p.o. daily -- Continue to monitor   #Left leg/thigh neuropathy--improving: --Currently on gabapentin 900 mg nightly and 600 mg in the morning --Encouraged to try water aerobics and stretches.  --Following with Dr. Debbe Bales (ortho) to see if she is a candidate for steroid injections.  -- Patient evaluated by Dr.  Barbaraann Cao thinks that this may be neuropathy worsened by her Velcade therapy.   #Episode of right UE tremor and speech abnormality: --Evaluated by Dr. Barbaraann Cao on 03/20/2023. Presumed etiology had been atrial fibrillation, but vascular disease, atheromatous changes, are also possibly implicated concurrently.  --MRI brain from 03/13/2023 show no acute finding including no evidence of intracranial neoplastic disease. There are chronic small-vessel ischemic changes of the pons, cerebral hemispheric white matter and right cerebellum. --Educated patient to seek immediate evaluate if there are repeat episodes. --No neurological deficits identified per exam today  #Supportive Care -- chemotherapy education complete.  -- port placement not required.  --Awaiting to start Zometa/Xgeva  -- zofran 8mg  q8H PRN and compazine 10mg  PO q6H for nausea -- acyclovir 400mg  PO BID for VCZ prophylaxis -- tylenol 1000 mg q8H PRN for back pain.   No orders of the defined types were placed in this encounter.   Patient expressed understanding of the plan provided.   I have spent a total of 30 minutes minutes of face-to-face and non-face-to-face time, preparing to see the patient, performing a medically appropriate examination, counseling and educating the patient, ordering tests/procedures, documenting clinical information in the electronic health record, and care coordination.   Ulysees Barns, MD Department of Hematology/Oncology [facility] Phone: [phone] Pager: [phone] Email: [email or web address]

## 2023-06-15 NOTE — Telephone Encounter (Signed)
Notified david to continue observing bp

## 2023-06-15 NOTE — Progress Notes (Signed)
Pt states she took tylenol and decadron prior to arrival.

## 2023-06-18 LAB — KAPPA/LAMBDA LIGHT CHAINS
Kappa free light chain: 9.7 mg/L (ref 3.3–19.4)
Kappa, lambda light chain ratio: 0.96 (ref 0.26–1.65)
Lambda free light chains: 10.1 mg/L (ref 5.7–26.3)

## 2023-06-19 NOTE — Telephone Encounter (Signed)
error 

## 2023-06-21 ENCOUNTER — Telehealth: Payer: Self-pay

## 2023-06-21 DIAGNOSIS — I110 Hypertensive heart disease with heart failure: Secondary | ICD-10-CM | POA: Diagnosis not present

## 2023-06-21 DIAGNOSIS — I480 Paroxysmal atrial fibrillation: Secondary | ICD-10-CM | POA: Diagnosis not present

## 2023-06-21 DIAGNOSIS — I509 Heart failure, unspecified: Secondary | ICD-10-CM | POA: Diagnosis not present

## 2023-06-21 DIAGNOSIS — E890 Postprocedural hypothyroidism: Secondary | ICD-10-CM | POA: Diagnosis not present

## 2023-06-21 LAB — MULTIPLE MYELOMA PANEL, SERUM
Albumin SerPl Elph-Mcnc: 3.4 g/dL (ref 2.9–4.4)
Albumin/Glob SerPl: 1.6 (ref 0.7–1.7)
Alpha 1: 0.3 g/dL (ref 0.0–0.4)
Alpha2 Glob SerPl Elph-Mcnc: 0.8 g/dL (ref 0.4–1.0)
B-Globulin SerPl Elph-Mcnc: 0.8 g/dL (ref 0.7–1.3)
Gamma Glob SerPl Elph-Mcnc: 0.3 g/dL — ABNORMAL LOW (ref 0.4–1.8)
Globulin, Total: 2.2 g/dL (ref 2.2–3.9)
IgA: 60 mg/dL — ABNORMAL LOW (ref 64–422)
IgG (Immunoglobin G), Serum: 444 mg/dL — ABNORMAL LOW (ref 586–1602)
IgM (Immunoglobulin M), Srm: 28 mg/dL (ref 26–217)
M Protein SerPl Elph-Mcnc: 0.1 g/dL — ABNORMAL HIGH
Total Protein ELP: 5.6 g/dL — ABNORMAL LOW (ref 6.0–8.5)

## 2023-06-21 NOTE — Telephone Encounter (Signed)
Zoe [name] from [facility] called stating patient son mentioned patient had a episode where she was slurring her words earlier today. Vitals was taken (vitals was fine) and patient was Asymptomatic and seems to be fine. Zoe [name] did leave a call back number [phone] if any more questions need to be asked

## 2023-06-22 ENCOUNTER — Ambulatory Visit: Payer: Medicare Other

## 2023-06-22 ENCOUNTER — Other Ambulatory Visit: Payer: Medicare Other

## 2023-06-22 ENCOUNTER — Ambulatory Visit: Payer: Medicare Other | Admitting: Hematology and Oncology

## 2023-06-23 ENCOUNTER — Encounter: Payer: Self-pay | Admitting: Hematology and Oncology

## 2023-06-27 DIAGNOSIS — I480 Paroxysmal atrial fibrillation: Secondary | ICD-10-CM | POA: Diagnosis not present

## 2023-06-27 DIAGNOSIS — I509 Heart failure, unspecified: Secondary | ICD-10-CM | POA: Diagnosis not present

## 2023-06-27 DIAGNOSIS — I110 Hypertensive heart disease with heart failure: Secondary | ICD-10-CM | POA: Diagnosis not present

## 2023-06-27 DIAGNOSIS — E890 Postprocedural hypothyroidism: Secondary | ICD-10-CM | POA: Diagnosis not present

## 2023-06-28 ENCOUNTER — Other Ambulatory Visit: Payer: Self-pay

## 2023-06-28 MED ORDER — LENALIDOMIDE 25 MG PO CAPS: 25.0000 mg | ORAL_CAPSULE | Freq: Every day | ORAL | 0 refills | Status: DC

## 2023-06-29 ENCOUNTER — Inpatient Hospital Stay: Payer: Medicare Other

## 2023-06-29 ENCOUNTER — Ambulatory Visit: Payer: Medicare Other

## 2023-06-29 ENCOUNTER — Inpatient Hospital Stay: Payer: Medicare Other | Attending: Physician Assistant

## 2023-06-29 ENCOUNTER — Ambulatory Visit: Payer: Medicare Other | Admitting: Hematology and Oncology

## 2023-06-29 ENCOUNTER — Inpatient Hospital Stay: Payer: Medicare Other | Admitting: Hematology and Oncology

## 2023-06-29 ENCOUNTER — Other Ambulatory Visit: Payer: Medicare Other

## 2023-06-29 DIAGNOSIS — Z5112 Encounter for antineoplastic immunotherapy: Secondary | ICD-10-CM | POA: Insufficient documentation

## 2023-06-29 DIAGNOSIS — Z7901 Long term (current) use of anticoagulants: Secondary | ICD-10-CM | POA: Insufficient documentation

## 2023-06-29 DIAGNOSIS — R600 Localized edema: Secondary | ICD-10-CM | POA: Diagnosis not present

## 2023-06-29 DIAGNOSIS — G629 Polyneuropathy, unspecified: Secondary | ICD-10-CM | POA: Diagnosis not present

## 2023-06-29 DIAGNOSIS — E538 Deficiency of other specified B group vitamins: Secondary | ICD-10-CM | POA: Diagnosis not present

## 2023-06-29 DIAGNOSIS — I4891 Unspecified atrial fibrillation: Secondary | ICD-10-CM | POA: Insufficient documentation

## 2023-06-29 DIAGNOSIS — C9000 Multiple myeloma not having achieved remission: Secondary | ICD-10-CM | POA: Diagnosis not present

## 2023-06-29 DIAGNOSIS — Z79899 Other long term (current) drug therapy: Secondary | ICD-10-CM | POA: Diagnosis not present

## 2023-06-29 DIAGNOSIS — L292 Pruritus vulvae: Secondary | ICD-10-CM | POA: Diagnosis not present

## 2023-06-29 DIAGNOSIS — R059 Cough, unspecified: Secondary | ICD-10-CM | POA: Diagnosis not present

## 2023-06-29 DIAGNOSIS — I959 Hypotension, unspecified: Secondary | ICD-10-CM | POA: Diagnosis not present

## 2023-06-29 DIAGNOSIS — D649 Anemia, unspecified: Secondary | ICD-10-CM | POA: Diagnosis not present

## 2023-06-29 LAB — CBC WITH DIFFERENTIAL (CANCER CENTER ONLY)
Abs Immature Granulocytes: 0.04 10*3/uL (ref 0.00–0.07)
Basophils Absolute: 0.1 10*3/uL (ref 0.0–0.1)
Basophils Relative: 1 %
Eosinophils Absolute: 0.2 10*3/uL (ref 0.0–0.5)
Eosinophils Relative: 3 %
HCT: 40.9 % (ref 36.0–46.0)
Hemoglobin: 13.7 g/dL (ref 12.0–15.0)
Immature Granulocytes: 1 %
Lymphocytes Relative: 8 %
Lymphs Abs: 0.6 10*3/uL — ABNORMAL LOW (ref 0.7–4.0)
MCH: 29.4 pg (ref 26.0–34.0)
MCHC: 33.5 g/dL (ref 30.0–36.0)
MCV: 87.8 fL (ref 80.0–100.0)
Monocytes Absolute: 1.5 10*3/uL — ABNORMAL HIGH (ref 0.1–1.0)
Monocytes Relative: 20 %
Neutro Abs: 5 10*3/uL (ref 1.7–7.7)
Neutrophils Relative %: 67 %
Platelet Count: 104 10*3/uL — ABNORMAL LOW (ref 150–400)
RBC: 4.66 MIL/uL (ref 3.87–5.11)
RDW: 18.1 % — ABNORMAL HIGH (ref 11.5–15.5)
WBC Count: 7.4 10*3/uL (ref 4.0–10.5)
nRBC: 0 % (ref 0.0–0.2)

## 2023-06-29 LAB — CMP (CANCER CENTER ONLY)
ALT: 12 U/L (ref 0–44)
AST: 13 U/L — ABNORMAL LOW (ref 15–41)
Albumin: 3.8 g/dL (ref 3.5–5.0)
Alkaline Phosphatase: 62 U/L (ref 38–126)
Anion gap: 6 (ref 5–15)
BUN: 34 mg/dL — ABNORMAL HIGH (ref 8–23)
CO2: 28 mmol/L (ref 22–32)
Calcium: 8.8 mg/dL — ABNORMAL LOW (ref 8.9–10.3)
Chloride: 101 mmol/L (ref 98–111)
Creatinine: 0.79 mg/dL (ref 0.44–1.00)
GFR, Estimated: >60 mL/min
Glucose, Bld: 101 mg/dL — ABNORMAL HIGH (ref 70–99)
Potassium: 4.1 mmol/L (ref 3.5–5.1)
Sodium: 135 mmol/L (ref 135–145)
Total Bilirubin: 0.6 mg/dL (ref 0.3–1.2)
Total Protein: 5.8 g/dL — ABNORMAL LOW (ref 6.5–8.1)

## 2023-06-29 LAB — LACTATE DEHYDROGENASE: LDH: 218 U/L — ABNORMAL HIGH (ref 98–192)

## 2023-06-29 MED ORDER — DARATUMUMAB-HYALURONIDASE-FIHJ 1800-30000 MG-UT/15ML ~~LOC~~ SOLN
1800.0000 mg | Freq: Once | SUBCUTANEOUS | Status: AC
  Administered 2023-06-29: 1800 mg via SUBCUTANEOUS
  Filled 2023-06-29: qty 15

## 2023-06-29 MED ORDER — LORATADINE 10 MG PO TABS
10.0000 mg | ORAL_TABLET | Freq: Once | ORAL | Status: AC
  Administered 2023-06-29: 10 mg via ORAL
  Filled 2023-06-29: qty 1

## 2023-06-29 NOTE — Progress Notes (Signed)
[facility] [phone]   [phone]   PROGRESS NOTE   Patient Care Team: [facility] as PCP - General (Rheumatology)   Hematological/Oncological History # IgG Lambda Multiple Myeloma  06/14/2022: establish care with [name] due to anemia. Labs showed M protein 4.9, Kappa 7.2, Lambda 10.8, ratio 0.67 07/13/2022: Bmbx showed Lambda restricted plasma cell neoplasm involving approximately 60% of the cellular marrow by IHC on the biopsy.  08/01/2022: Cycle 1 Day 1 of VRd chemotherapy. (Holding revlimid initially) 08/21/2022:  Cycle 2 Day 1 of VRd chemotherapy. (Holding revlimid) 09/04/2022: Cycle 3 Day 1 of VRd chemotherapy. Started revlimid 10/03/2022: Cycle 4 Day 1 of VRd chemotherapy 10/31/2022: Cycle 5 Day 1 of VRd chemotherapy 11/20/2022: Cycle 6 Day 1 of VRd chemotherapy 12/11/2022: Cycle 7 Day 1 of VRd chemotherapy 01/02/2023: Cycle 8 Day 1 of VRd chemotherapy 01/22/2023: Cycle 9 Day 1 of VRd chemotherapy 02/23/2023: Day 1 Cycle 1 of Dara/Rev/Dex 03/23/2023: Day 1 Cycle 2 of Dara/Rev/Dex 05/06/2023: chemotherapy on pause due to chest pain/cardiac/respiratory issues.  05/18/2023: Cycle 3 Day 1 of Dara/Rev/Dex 06/15/2023: Cycle 4 Day 1 of Dara/Rev/Dex   INTERVAL HISTORY: Zoe [name] 89 y.o. female returns to the clinic today for a follow-up visit accompanied by her daughter-in-law. Her last visit was on 06/01/2023. In the interim, she continued on Dara/Rev/Dex.   [name] reports she is feeling somewhat sleepy today as she did not sleep well last night.  She reports that she woke up about 4 to 5 AM and was having difficulty getting back to sleep.  She notes that she has been doing a lot of extra work in the kitchen lately with a neighborhood cooking group.  She notes that she enjoys the activity.  She reports that she is doing her best to try "not to sit around".  She notes that she is working with physical therapy and having  improvement there.  She reports that her leg pain is under better control at this time.  She is not having any major side effects as result of her current therapy.  She denies any recent infectious symptoms such as runny nose, sore throat, or cough.  He denies any injection site reactions or difficulty with the Revlimid/Dex pills.  She notes she is willing and able to continue treatment at this time.  She denies any fevers, chills, sweats, nausea vomiting or diarrhea.  A full 10 point ROS is otherwise negative.  MEDICAL HISTORY: Past Medical History:  Diagnosis Date   Arthritis    some - per patient   Breast cancer (HCC)    breast cancer / left    Cataract    bilat    GERD (gastroesophageal reflux disease)    History of kidney stones    Hyperlipidemia    Hypertension    Hypothyroidism    Macular degeneration    Left   S/P TAVR (transcatheter aortic valve replacement) 09/03/2018   23 mm Edwards Sapien 3 transcatheter heart valve placed via percutaneous right transfemoral approach    Severe aortic stenosis    Stress incontinence    Thyroid disease    Tinnitus     ALLERGIES:  is allergic to quinolones, penicillins, and sulfa antibiotics.  MEDICATIONS:  Current Outpatient Medications  Medication Sig Dispense Refill   pravastatin (PRAVACHOL) 40 MG tablet Take 40 mg by mouth every evening.     acyclovir (ZOVIRAX) 400 MG tablet Take 1 tablet (400 mg total) by mouth 2 (two) times  daily. 60 tablet 3   albuterol (VENTOLIN HFA) 108 (90 Base) MCG/ACT inhaler Inhale 2 puffs into the lungs every 6 (six) hours as needed for wheezing or shortness of breath. (Patient not taking: Reported on 05/17/2023) 8 g 2   amiodarone (PACERONE) 200 MG tablet Take 1 tablet (200 mg total) by mouth daily. 45 tablet 1   apixaban (ELIQUIS) 5 MG TABS tablet Take 1 tablet (5 mg total) by mouth 2 (two) times daily. 180 tablet 1   Artificial Tear Solution (SOOTHE XP) SOLN Place 1 drop into both eyes every evening.      Cholecalciferol (VITAMIN D3) 50 MCG (2000 UT) capsule Take 1 capsule (2,000 Units total) by mouth daily.     [employer] VITAMIN B12 1000 MCG tablet TAKE 1 TABLET BY MOUTH EVERY DAY 90 tablet 1   dapagliflozin propanediol (FARXIGA) 10 MG TABS tablet TAKE 1 TABLET BY MOUTH EVERY DAY 90 tablet 2   dexamethasone (DECADRON) 4 MG tablet Take 5 tablets (20 mg total) by mouth every 14 (fourteen) days. Take in the morning with food on treatment days 30 tablet 2   esomeprazole (NEXIUM) 20 MG capsule Take 20 mg by mouth daily as needed (Heartburn).     lenalidomide (REVLIMID) 25 MG capsule Take 1 capsule (25 mg total) by mouth daily. Zoe [name] #[id number]  Date Obtained 06/28/23 Take one capsule daily for 21 days none for 7 21 capsule 0   levothyroxine (SYNTHROID, LEVOTHROID) 100 MCG tablet Take 100 mcg by mouth daily before breakfast.  2   metoprolol tartrate (LOPRESSOR) 50 MG tablet Take 1 tablet (50 mg total) by mouth 2 (two) times daily. 60 tablet 1   Multiple Vitamins-Minerals (PRESERVISION AREDS) CAPS Take 1 capsule by mouth 2 (two) times daily.     OVER THE COUNTER MEDICATION Take 1 capsule by mouth in the morning and at bedtime. AREDS     polyethylene glycol (MIRALAX / GLYCOLAX) 17 g packet Take 17 g by mouth daily as needed for mild constipation.     sennosides-docusate sodium (SENOKOT-S) 8.6-50 MG tablet Take 1 tablet by mouth daily as needed for constipation. (Patient not taking: Reported on 06/29/2023)     No current facility-administered medications for this visit.    SURGICAL HISTORY:  Past Surgical History:  Procedure Laterality Date   ABDOMINAL HYSTERECTOMY  1970's   BACK SURGERY     BREAST LUMPECTOMY  12/1998   lumpectomy   CARDIAC CATHETERIZATION     CARDIOVERSION N/A 04/18/2023   Procedure: CARDIOVERSION;  Surgeon: Tessa Lerner, DO;  Location: [facility] INVASIVE CV LAB;  Service: Cardiovascular;  Laterality: N/A;   CARDIOVERSION N/A 04/20/2023   Procedure: CARDIOVERSION;  Surgeon: Yates Decamp, MD;   Location: [facility] INVASIVE CV LAB;  Service: Cardiovascular;  Laterality: N/A;   EYE SURGERY     cataract surgery bilat    INTRAOPERATIVE TRANSTHORACIC ECHOCARDIOGRAM N/A 09/03/2018   Procedure: INTRAOPERATIVE TRANSTHORACIC ECHOCARDIOGRAM;  Surgeon: Kathleene Hazel, MD;  Location: [facility] OR;  Service: Open Heart Surgery;  Laterality: N/A;   KYPHOPLASTY N/A 09/07/2022   Procedure: THORACIC EIGHT KYPHOPLASTY;  Surgeon: Venita Lick, MD;  Location: [facility] OR;  Service: Orthopedics;  Laterality: N/A;  1 hr Local with IV [facility] 3 C-Bed   LITHOTRIPSY     Right total knee     2018 Dr. Charlann Boxer   RIGHT/LEFT HEART CATH AND CORONARY ANGIOGRAPHY N/A 08/06/2018   Procedure: RIGHT/LEFT HEART CATH AND CORONARY ANGIOGRAPHY;  Surgeon: Yates Decamp, MD;  Location: [facility] INVASIVE CV LAB;  Service: Cardiovascular;  Laterality: N/A;   THYROIDECTOMY, PARTIAL  1975   TONSILLECTOMY     as a child - patient not sure of exact date   TOTAL KNEE ARTHROPLASTY Left 03/13/2016   Procedure: TOTAL KNEE ARTHROPLASTY;  Surgeon: Durene Romans, MD;  Location: [facility] ORS;  Service: Orthopedics;  Laterality: Left;   TOTAL KNEE ARTHROPLASTY Right 06/18/2017   Procedure: RIGHT TOTAL KNEE ARTHROPLASTY;  Surgeon: Durene Romans, MD;  Location: [facility] ORS;  Service: Orthopedics;  Laterality: Right;   TRANSCATHETER AORTIC VALVE REPLACEMENT, TRANSFEMORAL N/A 09/03/2018   Procedure: TRANSCATHETER AORTIC VALVE REPLACEMENT, TRANSFEMORAL;  Surgeon: Kathleene Hazel, MD;  Location: [facility] OR;  Service: Open Heart Surgery;  Laterality: N/A;   REVIEW OF SYSTEMS:   Constitutional: ( - ) fevers, ( - )  chills , ( - ) night sweats Eyes: ( - ) blurriness of vision, ( - ) double vision, ( - ) watery eyes Ears, nose, mouth, throat, and face: ( - ) mucositis, ( - ) sore throat Respiratory: ( +) cough, ( - ) dyspnea, ( - ) wheezes Cardiovascular: ( - ) palpitation, ( - ) chest discomfort, ( +) lower extremity swelling Gastrointestinal:  ( - ) nausea, ( - )  heartburn, ( - ) change in bowel habits Skin: ( - ) abnormal skin rashes Lymphatics: ( - ) new lymphadenopathy, ( - ) easy bruising Neurological: ( - ) numbness, ( - ) tingling, ( - ) new weaknesses Behavioral/Psych: ( - ) mood change, ( - ) new changes  All other systems were reviewed with the patient and are negative.   PHYSICAL EXAMINATION:  Blood pressure 127/67, pulse 60, temperature 97.9 F (36.6 C), temperature source Oral, resp. rate 13, weight 151 lb (68.5 kg), SpO2 99%.  ECOG PERFORMANCE STATUS: 1  GENERAL: well appearing elderly Caucasian female, alert, no distress and comfortable SKIN: skin color, texture, turgor are normal, no rashes or significant lesions EYES: conjunctiva are pink and non-injected, sclera clear LUNGS:normal breathing effort. Mild wheezing and crackles in lower lobes b/l.  HEART: irregular rhythm ,normal rate and no murmurs. +1 bilateral lower extremity edema starting below the knee to ankles.  Musculoskeletal: no cyanosis of digits and no clubbing  PSYCH: alert & oriented x 3, fluent speech NEURO: no focal motor/sensory deficits    LABORATORY DATA: Lab Results  Component Value Date   WBC 7.4 06/29/2023   HGB 13.7 06/29/2023   HCT 40.9 06/29/2023   MCV 87.8 06/29/2023   PLT 104 (L) 06/29/2023      Chemistry      Component Value Date/Time   NA 135 06/29/2023 0904   NA 130 (L) 06/19/2022 1012   K 4.1 06/29/2023 0904   CL 101 06/29/2023 0904   CO2 28 06/29/2023 0904   BUN 34 (H) 06/29/2023 0904   BUN 21 06/19/2022 1012   CREATININE 0.79 06/29/2023 0904      Component Value Date/Time   CALCIUM 8.8 (L) 06/29/2023 0904   ALKPHOS 62 06/29/2023 0904   AST 13 (L) 06/29/2023 0904   ALT 12 06/29/2023 0904   BILITOT 0.6 06/29/2023 0904       RADIOGRAPHIC STUDIES:  No results found.   ASSESSMENT/PLAN:  Zoe [name] is a 89 y.o. female who presents to the clinic for evaluation for IgG lambda multiple myeloma.   # IgG Lambda  Multiple Myeloma  --diagnosis of MM confirmed with bone marrow biopsy showing 60% plasma cells and anemia -- Bone survey shows no lytic  lesions, kidney function is within normal limits.  --08/01/2022 was Cycle 1 Day 1 of VRd  -Dr. Leonides Schanz discontinued velcade on 02/05/23 due to rash. She is status post day 15 cycle #9.  -Dr. Leonides Schanz changed the care plan to Daratumumab, Revlimid, and decadron. She is scheduled for day 1 cycle #1 today  -Started Cycle 1, Day 1 of Dara/Rev/Dex on 02/23/2023.  Plan:  --Due for Cycle 4 Day 15 of Dara/Rev/Dex today --Labs show white blood cell count white blood cell 7.4, hemoglobin 13.7, MCV 87.8, and platelets of 104. --last M protein from 06/15/2023 trended down to 0.1 (4.9 prior to start of therapy). Normalized SFLC.  --Will order a Dara IFE study with next labs.  Strongly suspect that her myeloma protein is undetectable, implying that we should be able to transition to maintenance Revlimid.  Will plan for Revlimid 10 mg p.o. daily 21 days on and 7 days off.  We will plan to start maintenance therapy approximately 07/27/2023.  We will see her in clinic on that day and to decide if we can discontinue Darzalex therapy at that time and fully transition to maintenance therapy. --RTC in 2 weeks with interval weekly treatment.   #Cough: --Chest xray from 03/23/2023 was negative for infectious process.  --Currently taking tessalon perles 100 mg TID with some improvement. --No fever and cough is mainly dry with occasional clear sputum. Cough worsens in supine position. --Encouraged to take mucinex and clairitin.  Patient scheduled for an upcoming appointment with pulmonology.  # Vaginal itching: --Sent prescription for nystatin powder    #Hypotension/BP/Atrial Fibrillation  --Follows with cardiology, Dr. Jacinto Halim -- Recommendations include metoprolol succinate 25 mg PO daily and restarted patient back on amiodarone 200 mg PO daily x 10 days followed by 12 mg daily.  --Continue  on Eliquis 5 mg BID   #Bilateral lower extremity edema:  --Continue to wear compression stockings.    # Normocytic Anemia #Vitamin B12 Deficiency --Anemia likely driven by multiple myeloma, but may be component of vitamin B12 deficiency as well. --initial labs showed elevated MMA with B12 180 -- Continue vitamin B12 1000 mcg p.o. daily -- Continue to monitor   #Left leg/thigh neuropathy--improving: --Currently on gabapentin 900 mg nightly and 600 mg in the morning --Encouraged to try water aerobics and stretches.  --Following with Dr. Debbe Bales (ortho) to see if she is a candidate for steroid injections.  -- Patient evaluated by Dr. Barbaraann Cao thinks that this may be neuropathy worsened by her Velcade therapy.   #Episode of right UE tremor and speech abnormality: --Evaluated by Dr. Barbaraann Cao on 03/20/2023. Presumed etiology had been atrial fibrillation, but vascular disease, atheromatous changes, are also possibly implicated concurrently.  --MRI brain from 03/13/2023 show no acute finding including no evidence of intracranial neoplastic disease. There are chronic small-vessel ischemic changes of the pons, cerebral hemispheric white matter and right cerebellum. --Educated patient to seek immediate evaluate if there are repeat episodes. --No neurological deficits identified per exam today  #Supportive Care -- chemotherapy education complete.  -- port placement not required.  --Awaiting to start Zometa/Xgeva  -- zofran 8mg  q8H PRN and compazine 10mg  PO q6H for nausea -- acyclovir 400mg  PO BID for VCZ prophylaxis -- tylenol 1000 mg q8H PRN for back pain.   Orders Placed This Encounter  Procedures   IFE, Dara-Specific, Serum    Standing Status:   Future    Standing Expiration Date:   06/28/2024   Ambulatory referral to Physical Therapy    Referral Priority:  Routine    Referral Type:   Physical Medicine    Referral Reason:   Specialty Services Required    Requested Specialty:   Physical  Therapy    Number of Visits Requested:   1    Patient expressed understanding of the plan provided.   I have spent a total of 30 minutes minutes of face-to-face and non-face-to-face time, preparing to see the patient, performing a medically appropriate examination, counseling and educating the patient, ordering tests/procedures, documenting clinical information in the electronic health record, and care coordination.   Ulysees Barns, MD Department of Hematology/Oncology [facility] Phone: [phone] Pager: [phone] Email: [email or web address]

## 2023-06-29 NOTE — Progress Notes (Signed)
Patient states she took Tylenol and dexamethasone at home at 0730.

## 2023-06-29 NOTE — Patient Instructions (Signed)
[facility]  Discharge Instructions: Thank you for choosing [facility] to provide your oncology and hematology care.   If you have a lab appointment with the Cancer Center, please go directly to the Cancer Center and check in at the registration area.   Wear comfortable clothing and clothing appropriate for easy access to any Portacath or PICC line.   We strive to give you quality time with your provider. You may need to reschedule your appointment if you arrive late (15 or more minutes).  Arriving late affects you and other patients whose appointments are after yours.  Also, if you miss three or more appointments without notifying the office, you may be dismissed from the clinic at the provider's discretion.      For prescription refill requests, have your pharmacy contact our office and allow 72 hours for refills to be completed.    Today you received the following chemotherapy and/or immunotherapy agents Darzalex Faspro      To help prevent nausea and vomiting after your treatment, we encourage you to take your nausea medication as directed.  BELOW ARE SYMPTOMS THAT SHOULD BE REPORTED IMMEDIATELY: *FEVER GREATER THAN 100.4 F (38 C) OR HIGHER *CHILLS OR SWEATING *NAUSEA AND VOMITING THAT IS NOT CONTROLLED WITH YOUR NAUSEA MEDICATION *UNUSUAL SHORTNESS OF BREATH *UNUSUAL BRUISING OR BLEEDING *URINARY PROBLEMS (pain or burning when urinating, or frequent urination) *BOWEL PROBLEMS (unusual diarrhea, constipation, pain near the anus) TENDERNESS IN MOUTH AND THROAT WITH OR WITHOUT PRESENCE OF ULCERS (sore throat, sores in mouth, or a toothache) UNUSUAL RASH, SWELLING OR PAIN  UNUSUAL VAGINAL DISCHARGE OR ITCHING   Items with * indicate a potential emergency and should be followed up as soon as possible or go to the Emergency Department if any problems should occur.  Please show the CHEMOTHERAPY ALERT CARD or IMMUNOTHERAPY ALERT CARD at  check-in to the Emergency Department and triage nurse.  Should you have questions after your visit or need to cancel or reschedule your appointment, please contact [facility]  Dept: [phone]  and follow the prompts.  Office hours are 8:00 a.m. to 4:30 p.m. Monday - Friday. Please note that voicemails left after 4:00 p.m. may not be returned until the following business day.  We are closed weekends and major holidays. You have access to a nurse at all times for urgent questions. Please call the main number to the clinic Dept: [phone] and follow the prompts.   For any non-urgent questions, you may also contact your provider using MyChart. We now offer e-Visits for anyone 18 and older to request care online for non-urgent symptoms. For details visit [email or web address].   Also download the MyChart app! Go to the app store, search "MyChart", open the app, select [facility], and log in with your MyChart username and password.  

## 2023-07-03 DIAGNOSIS — I480 Paroxysmal atrial fibrillation: Secondary | ICD-10-CM | POA: Diagnosis not present

## 2023-07-03 DIAGNOSIS — I110 Hypertensive heart disease with heart failure: Secondary | ICD-10-CM | POA: Diagnosis not present

## 2023-07-03 DIAGNOSIS — E890 Postprocedural hypothyroidism: Secondary | ICD-10-CM | POA: Diagnosis not present

## 2023-07-03 DIAGNOSIS — I509 Heart failure, unspecified: Secondary | ICD-10-CM | POA: Diagnosis not present

## 2023-07-04 ENCOUNTER — Encounter (INDEPENDENT_AMBULATORY_CARE_PROVIDER_SITE_OTHER): Payer: Medicare Other | Admitting: Ophthalmology

## 2023-07-04 DIAGNOSIS — I10 Essential (primary) hypertension: Secondary | ICD-10-CM | POA: Diagnosis not present

## 2023-07-04 DIAGNOSIS — H35033 Hypertensive retinopathy, bilateral: Secondary | ICD-10-CM | POA: Diagnosis not present

## 2023-07-04 DIAGNOSIS — H43813 Vitreous degeneration, bilateral: Secondary | ICD-10-CM | POA: Diagnosis not present

## 2023-07-04 DIAGNOSIS — H353231 Exudative age-related macular degeneration, bilateral, with active choroidal neovascularization: Secondary | ICD-10-CM

## 2023-07-04 DIAGNOSIS — H35372 Puckering of macula, left eye: Secondary | ICD-10-CM | POA: Diagnosis not present

## 2023-07-06 ENCOUNTER — Ambulatory Visit: Payer: Medicare Other

## 2023-07-06 ENCOUNTER — Other Ambulatory Visit: Payer: Medicare Other

## 2023-07-06 ENCOUNTER — Ambulatory Visit: Payer: Medicare Other | Admitting: Physician Assistant

## 2023-07-07 ENCOUNTER — Other Ambulatory Visit: Payer: Self-pay | Admitting: Cardiology

## 2023-07-08 NOTE — Therapy (Signed)
OUTPATIENT PHYSICAL THERAPY  LOWER EXTREMITY ONCOLOGY EVALUATION  Patient Name: Zoe [name] MRN: [id number] DOB:[birth year], 89 y.o., female Today's Date: 07/09/2023  END OF SESSION:  PT End of Session - 07/09/23 1118     Visit Number 1    Number of Visits 12    Date for PT Re-Evaluation 08/20/23    PT Start Time 1001    PT Stop Time 1058    PT Time Calculation (min) 57 min    Activity Tolerance Patient tolerated treatment well    Behavior During Therapy [facility] for tasks assessed/performed             Past Medical History:  Diagnosis Date   Arthritis    some - per patient   Breast cancer (HCC)    breast cancer / left    Cataract    bilat    GERD (gastroesophageal reflux disease)    History of kidney stones    Hyperlipidemia    Hypertension    Hypothyroidism    Macular degeneration    Left   S/P TAVR (transcatheter aortic valve replacement) 09/03/2018   23 mm Edwards Sapien 3 transcatheter heart valve placed via percutaneous right transfemoral approach    Severe aortic stenosis    Stress incontinence    Thyroid disease    Tinnitus    Past Surgical History:  Procedure Laterality Date   ABDOMINAL HYSTERECTOMY  1970's   BACK SURGERY     BREAST LUMPECTOMY  12/1998   lumpectomy   CARDIAC CATHETERIZATION     CARDIOVERSION N/A 04/18/2023   Procedure: CARDIOVERSION;  Surgeon: Tessa Lerner, DO;  Location: [facility] INVASIVE CV LAB;  Service: Cardiovascular;  Laterality: N/A;   CARDIOVERSION N/A 04/20/2023   Procedure: CARDIOVERSION;  Surgeon: Yates Decamp, MD;  Location: [facility] INVASIVE CV LAB;  Service: Cardiovascular;  Laterality: N/A;   EYE SURGERY     cataract surgery bilat    INTRAOPERATIVE TRANSTHORACIC ECHOCARDIOGRAM N/A 09/03/2018   Procedure: INTRAOPERATIVE TRANSTHORACIC ECHOCARDIOGRAM;  Surgeon: Kathleene Hazel, MD;  Location: [facility] OR;  Service: Open Heart Surgery;  Laterality: N/A;   KYPHOPLASTY N/A 09/07/2022   Procedure: THORACIC EIGHT KYPHOPLASTY;  Surgeon:  Venita Lick, MD;  Location: [facility] OR;  Service: Orthopedics;  Laterality: N/A;  1 hr Local with [facility] 3 C-Bed   LITHOTRIPSY     Right total knee     2018 Dr. Charlann Boxer   RIGHT/LEFT HEART CATH AND CORONARY ANGIOGRAPHY N/A 08/06/2018   Procedure: RIGHT/LEFT HEART CATH AND CORONARY ANGIOGRAPHY;  Surgeon: Yates Decamp, MD;  Location: [facility] INVASIVE CV LAB;  Service: Cardiovascular;  Laterality: N/A;   THYROIDECTOMY, PARTIAL  1975   TONSILLECTOMY     as a child - patient not sure of exact date   TOTAL KNEE ARTHROPLASTY Left 03/13/2016   Procedure: TOTAL KNEE ARTHROPLASTY;  Surgeon: Durene Romans, MD;  Location: [facility] ORS;  Service: Orthopedics;  Laterality: Left;   TOTAL KNEE ARTHROPLASTY Right 06/18/2017   Procedure: RIGHT TOTAL KNEE ARTHROPLASTY;  Surgeon: Durene Romans, MD;  Location: [facility] ORS;  Service: Orthopedics;  Laterality: Right;   TRANSCATHETER AORTIC VALVE REPLACEMENT, TRANSFEMORAL N/A 09/03/2018   Procedure: TRANSCATHETER AORTIC VALVE REPLACEMENT, TRANSFEMORAL;  Surgeon: Kathleene Hazel, MD;  Location: [facility] OR;  Service: Open Heart Surgery;  Laterality: N/A;   Patient Active Problem List   Diagnosis Date Noted   Atypical atrial flutter (HCC) 04/19/2023   Acute on chronic diastolic heart failure (HCC) 04/19/2023   Atrial fibrillation with rapid ventricular response (HCC)  04/18/2023   History of stroke 04/16/2023   Fall at home, initial encounter 04/16/2023   Atrial fibrillation (HCC) 04/16/2023   Paroxysmal atrial fibrillation (HCC) 04/15/2023   Elevated troponin 04/15/2023   Chest pain 04/13/2023   Stroke due to embolism (HCC) 03/20/2023   Chest pain, rule out acute myocardial infarction 02/17/2023   Chemotherapy-induced neuropathy (HCC) 11/28/2022   Pain due to onychomycosis of toenails of both feet 11/08/2022   Multiple myeloma not having achieved remission (HCC) 07/24/2022   Chronic diastolic CHF (congestive heart failure) (HCC) 05/16/2022   Symptomatic anemia 05/16/2022    Hyponatremia 04/02/2022   Microcytic anemia 04/02/2022   S/P TAVR (transcatheter aortic valve replacement) 09/03/2018   Hypothyroidism    Hypertension    Hyperlipidemia    GERD (gastroesophageal reflux disease)    S/P right TKA 06/18/2017   Class 1 obesity 03/15/2016   S/P left TKA 03/13/2016   History of breast cancer, DCIS, lumpectomy March 2000 12/13/2011    PCP: Dr. Irena Reichmann  REFERRING PROVIDER: Dr. Jeanie Sewer, IV  REFERRING DIAG: Multiple Myeloma  THERAPY DIAG:  Multiple myeloma without remission (HCC)  Muscle weakness (generalized)  Difficulty walking  Balance problem  ONSET DATE: 07/2022  Rationale for Evaluation and Treatment: Rehabilitation  SUBJECTIVE:                                                                                                                                                                                           SUBJECTIVE STATEMENT:  I need to get my balance better so I can move without fear of falling. Difficult getting up and down from chairs, Some fatigue with walking short distances;ie car into clinic. Reports some foot numbness if she is cold but feels good with socks on. Her grandson lives with her, but she has a home health aid in the day time and the night time for safety   PERTINENT HISTORY:  Pt was diagnosed with  IgGMultiple Myeloma in 07/2022 after a bone Marrow biopsy showed 60% plasma cells and anemia. No lytic lesions. She was started on chemotherapy. She had surgery for T 8 compression fracture kyphoplasty on 09/07/2022. She was hospitalized 04/15/2023 for Atrial Fibrillation with cardioversion attempted on 04/18/2023. She had some PT at that time. She has a home health aide that drives her to appts and helps with keeping her house clean And someone at night to help her get to the bathroom  PMH significant for A-fib on Eliquis and amiodarone, hypothyroidism, HTN, breast cancer, multiple myeloma,aortic stenosis status post  TAVR, hx of TIA, Macular degeneration, Head aches she thinks related to her eyes PAIN:  Are you having pain? Yes NPRS scale: 7/10 Pain location: legs and knees, Back Pain orientation: Bilateral  PAIN TYPE:  aching, tingling Pain description: constant  Aggravating factors: standing,cooking Relieving factors: tylenol  PRECAUTIONS: PMH significant of A-fib on Eliquis and amiodarone, hypothyroidism, HTN, Left breast cancer, multiple myeloma,aortic stenosis status post TAVR, hx of TIA, T8 compression fx with kyphoplasty  RED FLAGS: Bowel or bladder incontinence: Yes:   and Compression fracture: Yes: had a kyphoplasty    WEIGHT BEARING RESTRICTIONS: No  FALLS:  Has patient fallen in last 6 months? Yes. Number of falls 1, fell in community garden: no injury  LIVING ENVIRONMENT: Lives with: Lucila Maine lives with her presently Lives in: House/apartment Stairs: No;  Has following equipment at home: Single point cane, Environmental consultant - 4 wheeled, and shower chair  OCCUPATION: Retired  LEISURE: read,visit with friends  PRIOR LEVEL OF FUNCTION: Independent with basic ADLs, Independent with household mobility with device, and Needs assistance with homemaking  PATIENT GOALS: Be able to walk more securely, not be dependent on a walker/rollator   OBJECTIVE:  COGNITION: Overall cognitive status: deficits  PALPATION:   OBSERVATIONS / OTHER ASSESSMENTS: Ambulates with rollator, flexed posture/increased kyphosis  SENSATION: Light touch: deficits   POSTURE: forward head, rounded shoulders, increased thoracic kyphosis  LOWER EXTREMITY STRENGTH:  MMT Right eval  Hip flexion   Hip extension   Hip abduction   Hip adduction   Hip internal rotation   Hip external rotation   Knee flexion   Knee extension   Ankle dorsiflexion   Ankle plantarflexion   Ankle inversion   Ankle eversion   Great toe extension    (Blank rows = not tested)  MMT LEFT eval  Hip flexion   Hip extension   Hip  abduction   Hip adduction   Hip internal rotation   Hip external rotation   Knee flexion   Knee extension   Ankle dorsiflexion   Ankle plantarflexion   Ankle inversion   Ankle eversion   Great toe extension     (Blank rows = not tested)  LYMPHEDEMA ASSESSMENTS:   SURGERY TYPE/DATE: Left lumpectomy 2000  NUMBER OF LYMPH NODES REMOVED: Thinks so, not sure how many  CHEMOTHERAPY: Yes for Multiple Myeloma, No for Breast Cancer  RADIATION:No for Breast Cancer  HORMONE TREATMENT: Yes for Breast Cancer  INFECTIONS: NO  LYMPHEDEMA ASSESSMENTS:   LOWER EXTREMITY LANDMARK RIGHT eval  At groin   30 cm proximal to suprapatella   20 cm proximal to suprapatella   10 cm proximal to suprapatella   At midpatella / popliteal crease   30 cm proximal to floor at lateral plantar foot   20 cm proximal to floor at lateral plantar foot   10 cm proximal to floor at lateral plantar foot   Circumference of ankle/heel   5 cm proximal to 1st MTP joint   Across MTP joint   Around proximal great toe   (Blank rows = not tested)  LOWER EXTREMITY LANDMARK LEFT eval  At groin   30 cm proximal to suprapatella   20 cm proximal to suprapatella   10 cm proximal to suprapatella   At midpatella / popliteal crease   30 cm proximal to floor at lateral plantar foot   20 cm proximal to floor at lateral plantar foot   10 cm proximal to floor at lateral plantar foot   Circumference of ankle/heel   5 cm proximal to 1st MTP joint   Across MTP joint  Around proximal great toe   (Blank rows = not tested)  FUNCTIONAL TESTS:  30 seconds sit to stand test: 5 repetitions with use of UE's  Timed up and go: 30.4 seconds with rollator and use of hands to rise from chair  Four position balance test:  able to maintain DLS feet together x 10 sec, and half tandem with left foot forward x 10 sec. , unable to hold half tandem with right foot forward for greater than 3 seconds, unable to maintain tandem stance  bilaterally for more than a few seconds. Did not test SLS GAIT: Distance walked: Nustep to ramp in front of building Assistive device utilized: Rollator Level of assistance: Modified independence Comments:flexed posture, increased kyphosis    Outcome measure:  TODAY'S TREATMENT:                                                                                                                                          DATE:  Discussed POC and treatment interventions including, strength, postural education, balance and function   PATIENT EDUCATION:  Education details: Discussed POC and treatment interventions including, strength, balance and function Person educated: Patient Education method: Explanation Education comprehension: verbalized understanding  HOME EXERCISE PROGRAM: None given today  ASSESSMENT:  CLINICAL IMPRESSION: Patient is a 89 y.o. female who was seen today for physical therapy evaluation and treatment for weakness and decondtitioning due to Multiple Myeloma and prior hospitalization with Afib.. She ambulates with a rollator, but requires significant use of her arms to rise from chairs. She had difficulty with the 4 position balance test and could only maintain DLS, and left foot forward for half tandem x 10 sec. without LOB. She used her rollator for the Timed Up and GO TEST and required hands to rise from chair. Score was poor for her age level. The 30 second sit to stand was also poor for her age level all of which increase her risk for falls. She will benefit from skilled PT to address deficits and return to a safer, more functional lifestyle.   OBJECTIVE IMPAIRMENTS: decreased activity tolerance, decreased balance, decreased cognition, decreased endurance, decreased knowledge of condition, difficulty walking, decreased strength, impaired sensation, impaired vision/preception, postural dysfunction, and pain.   ACTIVITY LIMITATIONS: carrying, lifting, standing,  squatting, bed mobility, dressing, and locomotion level  PARTICIPATION LIMITATIONS: meal prep, cleaning, laundry, driving, shopping, and community activity  PERSONAL FACTORS: Age and 3+ comorbidities: multiple myeloma,Macular Degeneration , Afib are also affecting patient's functional outcome.   REHAB POTENTIAL: Good  CLINICAL DECISION MAKING: Evolving/moderate complexity  EVALUATION COMPLEXITY: Low   GOALS: Goals reviewed with patient? Yes  SHORT TERM GOALS: Target date: 07/30/2023  Pt will be independent in a HEP for LE/core strength Baseline: Goal status: INITIAL  2.  Pt will be able to maintain half tandem stance bilaterally x 10 sec Baseline:  Goal status: INITIAL  3.  Pt  will perform 6-7 sit to stands in 30 seconds to decrease fall risk Baseline:  Goal status: INITIAL  4.  Pt will perform TUG in 26 seconds to decrease fall risk Baseline:  Goal status: INITIAL   LONG TERM GOALS: Target date: 08/20/2023  Pt will be able to maintain tandem stance for 10 seconds B to demonstrate improved balance Baseline:  Goal status: INITIAL  2.  Pt will be able to perform 9 sit to stands in 30 seconds for average score to decrease fall risk Baseline:  Goal status: INITIAL  3.  Pt will be able to perform TUG with rollator in 24 seconds or less for improved gait speed and decreased fall risk Baseline:  Goal status: INITIAL  4.  Pt will be independent in more advanced HEP Baseline:  Goal status: INITIAL   PLAN:  PT FREQUENCY: 2x/week  PT DURATION: 6 weeks  PLANNED INTERVENTIONS: Therapeutic exercises, Therapeutic activity, Neuromuscular re-education, Balance training, Gait training, Patient/Family education, Self Care, and Manual therapy  PLAN FOR NEXT SESSION: assess LE strength, Initiate HEP for LE strength, progress balance, function, gait   Waynette Buttery, PT 07/09/2023, 5:39 PM

## 2023-07-09 ENCOUNTER — Other Ambulatory Visit: Payer: Self-pay

## 2023-07-09 ENCOUNTER — Ambulatory Visit: Payer: Medicare Other | Attending: Hematology and Oncology

## 2023-07-09 DIAGNOSIS — M6281 Muscle weakness (generalized): Secondary | ICD-10-CM | POA: Diagnosis not present

## 2023-07-09 DIAGNOSIS — R262 Difficulty in walking, not elsewhere classified: Secondary | ICD-10-CM | POA: Diagnosis not present

## 2023-07-09 DIAGNOSIS — C9000 Multiple myeloma not having achieved remission: Secondary | ICD-10-CM | POA: Diagnosis not present

## 2023-07-09 DIAGNOSIS — Z5189 Encounter for other specified aftercare: Secondary | ICD-10-CM | POA: Diagnosis not present

## 2023-07-09 DIAGNOSIS — R2689 Other abnormalities of gait and mobility: Secondary | ICD-10-CM

## 2023-07-12 ENCOUNTER — Ambulatory Visit: Payer: Medicare Other

## 2023-07-12 DIAGNOSIS — C9000 Multiple myeloma not having achieved remission: Secondary | ICD-10-CM

## 2023-07-12 DIAGNOSIS — R2689 Other abnormalities of gait and mobility: Secondary | ICD-10-CM

## 2023-07-12 DIAGNOSIS — R262 Difficulty in walking, not elsewhere classified: Secondary | ICD-10-CM

## 2023-07-12 DIAGNOSIS — Z5189 Encounter for other specified aftercare: Secondary | ICD-10-CM | POA: Diagnosis not present

## 2023-07-12 DIAGNOSIS — M6281 Muscle weakness (generalized): Secondary | ICD-10-CM

## 2023-07-12 NOTE — Therapy (Signed)
OUTPATIENT PHYSICAL THERAPY  LOWER EXTREMITY ONCOLOGY TREATMENT  Patient Name: Zoe [name] MRN: [id number] DOB:[birth year], 89 y.o., female Today's Date: 07/12/2023  END OF SESSION:  PT End of Session - 07/12/23 1046     Visit Number 2    Number of Visits 12    Date for PT Re-Evaluation 08/20/23    PT Start Time 1053    PT Stop Time 1155    PT Time Calculation (min) 62 min    Activity Tolerance Patient tolerated treatment well    Behavior During Therapy [facility] for tasks assessed/performed             Past Medical History:  Diagnosis Date   Arthritis    some - per patient   Breast cancer (HCC)    breast cancer / left    Cataract    bilat    GERD (gastroesophageal reflux disease)    History of kidney stones    Hyperlipidemia    Hypertension    Hypothyroidism    Macular degeneration    Left   S/P TAVR (transcatheter aortic valve replacement) 09/03/2018   23 mm Edwards Sapien 3 transcatheter heart valve placed via percutaneous right transfemoral approach    Severe aortic stenosis    Stress incontinence    Thyroid disease    Tinnitus    Past Surgical History:  Procedure Laterality Date   ABDOMINAL HYSTERECTOMY  1970's   BACK SURGERY     BREAST LUMPECTOMY  12/1998   lumpectomy   CARDIAC CATHETERIZATION     CARDIOVERSION N/A 04/18/2023   Procedure: CARDIOVERSION;  Surgeon: Tessa Lerner, DO;  Location: [facility] INVASIVE CV LAB;  Service: Cardiovascular;  Laterality: N/A;   CARDIOVERSION N/A 04/20/2023   Procedure: CARDIOVERSION;  Surgeon: Yates Decamp, MD;  Location: [facility] INVASIVE CV LAB;  Service: Cardiovascular;  Laterality: N/A;   EYE SURGERY     cataract surgery bilat    INTRAOPERATIVE TRANSTHORACIC ECHOCARDIOGRAM N/A 09/03/2018   Procedure: INTRAOPERATIVE TRANSTHORACIC ECHOCARDIOGRAM;  Surgeon: Kathleene Hazel, MD;  Location: [facility] OR;  Service: Open Heart Surgery;  Laterality: N/A;   KYPHOPLASTY N/A 09/07/2022   Procedure: THORACIC EIGHT KYPHOPLASTY;  Surgeon:  Venita Lick, MD;  Location: [facility] OR;  Service: Orthopedics;  Laterality: N/A;  1 hr Local with [facility] 3 C-Bed   LITHOTRIPSY     Right total knee     2018 Dr. Charlann Boxer   RIGHT/LEFT HEART CATH AND CORONARY ANGIOGRAPHY N/A 08/06/2018   Procedure: RIGHT/LEFT HEART CATH AND CORONARY ANGIOGRAPHY;  Surgeon: Yates Decamp, MD;  Location: [facility] INVASIVE CV LAB;  Service: Cardiovascular;  Laterality: N/A;   THYROIDECTOMY, PARTIAL  1975   TONSILLECTOMY     as a child - patient not sure of exact date   TOTAL KNEE ARTHROPLASTY Left 03/13/2016   Procedure: TOTAL KNEE ARTHROPLASTY;  Surgeon: Durene Romans, MD;  Location: [facility] ORS;  Service: Orthopedics;  Laterality: Left;   TOTAL KNEE ARTHROPLASTY Right 06/18/2017   Procedure: RIGHT TOTAL KNEE ARTHROPLASTY;  Surgeon: Durene Romans, MD;  Location: [facility] ORS;  Service: Orthopedics;  Laterality: Right;   TRANSCATHETER AORTIC VALVE REPLACEMENT, TRANSFEMORAL N/A 09/03/2018   Procedure: TRANSCATHETER AORTIC VALVE REPLACEMENT, TRANSFEMORAL;  Surgeon: Kathleene Hazel, MD;  Location: [facility] OR;  Service: Open Heart Surgery;  Laterality: N/A;   Patient Active Problem List   Diagnosis Date Noted   Atypical atrial flutter (HCC) 04/19/2023   Acute on chronic diastolic heart failure (HCC) 04/19/2023   Atrial fibrillation with rapid ventricular response (HCC)  04/18/2023   History of stroke 04/16/2023   Fall at home, initial encounter 04/16/2023   Atrial fibrillation (HCC) 04/16/2023   Paroxysmal atrial fibrillation (HCC) 04/15/2023   Elevated troponin 04/15/2023   Chest pain 04/13/2023   Stroke due to embolism (HCC) 03/20/2023   Chest pain, rule out acute myocardial infarction 02/17/2023   Chemotherapy-induced neuropathy (HCC) 11/28/2022   Pain due to onychomycosis of toenails of both feet 11/08/2022   Multiple myeloma not having achieved remission (HCC) 07/24/2022   Chronic diastolic CHF (congestive heart failure) (HCC) 05/16/2022   Symptomatic anemia 05/16/2022    Hyponatremia 04/02/2022   Microcytic anemia 04/02/2022   S/P TAVR (transcatheter aortic valve replacement) 09/03/2018   Hypothyroidism    Hypertension    Hyperlipidemia    GERD (gastroesophageal reflux disease)    S/P right TKA 06/18/2017   Class 1 obesity 03/15/2016   S/P left TKA 03/13/2016   History of breast cancer, DCIS, lumpectomy March 2000 12/13/2011    PCP: Dr. Irena Reichmann  REFERRING PROVIDER: Dr. Jeanie Sewer, IV  REFERRING DIAG: Multiple Myeloma  THERAPY DIAG:  Multiple myeloma without remission (HCC)  Muscle weakness (generalized)  Difficulty walking  Balance problem  ONSET DATE: 07/2022  Rationale for Evaluation and Treatment: Rehabilitation  SUBJECTIVE:                                                                                                                                                                                           SUBJECTIVE STATEMENT:  I have been busy this am; I cooked breakfast and showered. Put laundry in before I left.  eval I need to get my balance better so I can move without fear of falling. Difficult getting up and down from chairs, Some fatigue with walking short distances;ie car into clinic. Reports some foot numbness if she is cold but feels good with socks on. Her grandson lives with her, but she has a home health aid in the day time and the night time for safety PERTINENT HISTORY:  Pt was diagnosed with  IgGMultiple Myeloma in 07/2022 after a bone Marrow biopsy showed 60% plasma cells and anemia. No lytic lesions. She was started on chemotherapy. She had surgery for T 8 compression fracture kyphoplasty on 09/07/2022. She was hospitalized 04/15/2023 for Atrial Fibrillation with cardioversion attempted on 04/18/2023. She had some PT at that time. She has a home health aide that drives her to appts and helps with keeping her house clean And someone at night to help her get to the bathroom  PMH significant for A-fib on Eliquis and  amiodarone, hypothyroidism, HTN, breast cancer, multiple myeloma,aortic stenosis status  post TAVR, hx of TIA, Macular degeneration, Head aches she thinks related to her eyes PAIN:  Are you having pain? Yes NPRS scale: 6/10 Pain location: legs and knees, Back Pain orientation: Bilateral  PAIN TYPE:  aching, tingling Pain description: constant  Aggravating factors: standing,cooking Relieving factors: tylenol  PRECAUTIONS: PMH significant of A-fib on Eliquis and amiodarone, hypothyroidism, HTN, Left breast cancer, multiple myeloma,aortic stenosis status post TAVR, hx of TIA, T8 compression fx with kyphoplasty  RED FLAGS: Bowel or bladder incontinence: Yes:   and Compression fracture: Yes: had a kyphoplasty    WEIGHT BEARING RESTRICTIONS: No  FALLS:  Has patient fallen in last 6 months? Yes. Number of falls 1, fell in community garden: no injury  LIVING ENVIRONMENT: Lives with: Lucila Maine lives with her presently Lives in: House/apartment Stairs: No;  Has following equipment at home: Single point cane, Environmental consultant - 4 wheeled, and shower chair  OCCUPATION: Retired  LEISURE: read,visit with friends  PRIOR LEVEL OF FUNCTION: Independent with basic ADLs, Independent with household mobility with device, and Needs assistance with homemaking  PATIENT GOALS: Be able to walk more securely, not be dependent on a walker/rollator   OBJECTIVE:  COGNITION: Overall cognitive status: deficits  PALPATION:   OBSERVATIONS / OTHER ASSESSMENTS: Ambulates with rollator, flexed posture/increased kyphosis  SENSATION: Light touch: deficits   POSTURE: forward head, rounded shoulders, increased thoracic kyphosis  LOWER EXTREMITY STRENGTH:  MMT Right eval  Hip flexion 4-  Hip extension Able to bridge  Hip abduction 4-  Hip adduction 4 -seated  Hip internal rotation 3-  Hip external rotation 3+  Knee flexion 4+  Knee extension 4+  Ankle dorsiflexion 5  Ankle plantarflexion   Ankle inversion    Ankle eversion   Great toe extension    (Blank rows = not tested)  MMT LEFT eval  Hip flexion 4  Hip extension Able to bridge  Hip abduction 4-  Hip adduction 4-  Hip internal rotation 3+  Hip external rotation 3  Knee flexion 4+  Knee extension 4  Ankle dorsiflexion 5  Ankle plantarflexion   Ankle inversion   Ankle eversion   Great toe extension     (Blank rows = not tested)  LYMPHEDEMA ASSESSMENTS:   SURGERY TYPE/DATE: Left lumpectomy 2000  NUMBER OF LYMPH NODES REMOVED: Thinks so, not sure how many  CHEMOTHERAPY: Yes for Multiple Myeloma, No for Breast Cancer  RADIATION:No for Breast Cancer  HORMONE TREATMENT: Yes for Breast Cancer  INFECTIONS: NO  LYMPHEDEMA ASSESSMENTS:   LOWER EXTREMITY LANDMARK RIGHT eval  At groin   30 cm proximal to suprapatella   20 cm proximal to suprapatella   10 cm proximal to suprapatella   At midpatella / popliteal crease   30 cm proximal to floor at lateral plantar foot   20 cm proximal to floor at lateral plantar foot   10 cm proximal to floor at lateral plantar foot   Circumference of ankle/heel   5 cm proximal to 1st MTP joint   Across MTP joint   Around proximal great toe   (Blank rows = not tested)  LOWER EXTREMITY LANDMARK LEFT eval  At groin   30 cm proximal to suprapatella   20 cm proximal to suprapatella   10 cm proximal to suprapatella   At midpatella / popliteal crease   30 cm proximal to floor at lateral plantar foot   20 cm proximal to floor at lateral plantar foot   10 cm proximal to floor at lateral  plantar foot   Circumference of ankle/heel   5 cm proximal to 1st MTP joint   Across MTP joint   Around proximal great toe   (Blank rows = not tested)  FUNCTIONAL TESTS:  30 seconds sit to stand test: 5 repetitions with use of UE's  Timed up and go: 30.4 seconds with rollator and use of hands to rise from chair  Four position balance test:  able to maintain DLS feet together x 10 sec, and half  tandem with left foot forward x 10 sec. , unable to hold half tandem with right foot forward for greater than 3 seconds, unable to maintain tandem stance bilaterally for more than a few seconds. Did not test SLS GAIT: Distance walked: Nustep to ramp in front of building Assistive device utilized: Rollator Level of assistance: Modified independence Comments:flexed posture, increased kyphosis    Outcome measure:  TODAY'S TREATMENT:                                                                                                                                          DATE:  07/12/2023 Assessed MMT  B Sit to stand from slightly higher mat table 2 x 5 with emphasis on control and form without use of hands Nu Step;Seat 7, UE 8, Lev 2 x 5 min, 238 steps Heel raises x 10 with HH in bars Marching x 10 with HH, in bars Hip abd x 10 B, HH Seated LAQ  2x 10 alternating with 2# B Ball squeeze x 10 Bridging 2 x 5 Piriformis stretch x 2 ea 20 sec supine Updated HEP with pictures;gave large copy due to Macular degeneration  EVAL Discussed POC and treatment interventions including, strength, postural education, balance and function   PATIENT EDUCATION:  Access Code: [email or web address] URL: [email or web address] Date: 07/12/2023 Prepared by: Alvira Monday  Exercises - Supine Bridge  - 1 x daily - 7 x weekly - 1 sets - 10 reps - Seated Long Arc Quad  - 1 x daily - 7 x weekly - 1 sets - 10 reps - Standing Hip Abduction with Counter Support  - 1 x daily - 7 x weekly - 1 sets - 10 reps - Seated March  - 1 x daily - 7 x weekly - 1 sets - 10 reps - Standing March with Counter Support  - 1 x daily - 7 x weekly - 1 sets - 10 reps - Standing Heel Raise  - 1 x daily - 7 x weekly - 1 sets - 10 reps - Seated Hip Adduction Isometrics with Ball  - 1 x daily - 7 x weekly - 1 sets - 10 reps - 5 hold Education details: Discussed POC and treatment interventions including, strength, balance and  function Person educated: Patient Education method: Explanation Education comprehension: verbalized understanding  HOME EXERCISE PROGRAM:  Supine Bridge  - 1 x daily - 7  x weekly - 1 sets - 10 reps - Seated Long Arc Quad  - 1 x daily - 7 x weekly - 1 sets - 10 reps - Standing Hip Abduction with Counter Support  - 1 x daily - 7 x weekly - 1 sets - 10 reps - Seated March  - 1 x daily - 7 x weekly - 1 sets - 10 reps - Standing March with Counter Support  - 1 x daily - 7 x weekly - 1 sets - 10 reps - Standing Heel Raise  - 1 x daily - 7 x weekly - 1 sets - 10 reps - Seated Hip Adduction Isometrics with Ball  - 1 x daily - 7 x weekly - 1 sets - 10 reps - 5 hold  ASSESSMENT:  CLINICAL IMPRESSION: Performed MMT and initiated Nu Step and strengthening exs for HEP. HEP was updated. Pt did well with exercises today in clinic needing occasional VC's and assist for form. She has difficulty getting her head  over her feet for sit to stand, and requires frequent cueing to keep her head up and not look down to maintain better back position.   OBJECTIVE IMPAIRMENTS: decreased activity tolerance, decreased balance, decreased cognition, decreased endurance, decreased knowledge of condition, difficulty walking, decreased strength, impaired sensation, impaired vision/preception, postural dysfunction, and pain.   ACTIVITY LIMITATIONS: carrying, lifting, standing, squatting, bed mobility, dressing, and locomotion level  PARTICIPATION LIMITATIONS: meal prep, cleaning, laundry, driving, shopping, and community activity  PERSONAL FACTORS: Age and 3+ comorbidities: multiple myeloma,Macular Degeneration , Afib are also affecting patient's functional outcome.   REHAB POTENTIAL: Good  CLINICAL DECISION MAKING: Evolving/moderate complexity  EVALUATION COMPLEXITY: Low   GOALS: Goals reviewed with patient? Yes  SHORT TERM GOALS: Target date: 07/30/2023  Pt will be independent in a HEP for LE/core  strength Baseline: Goal status: INITIAL  2.  Pt will be able to maintain half tandem stance bilaterally x 10 sec Baseline:  Goal status: INITIAL  3.  Pt will perform 6-7 sit to stands in 30 seconds to decrease fall risk Baseline:  Goal status: INITIAL  4.  Pt will perform TUG in 26 seconds to decrease fall risk Baseline:  Goal status: INITIAL   LONG TERM GOALS: Target date: 08/20/2023  Pt will be able to maintain tandem stance for 10 seconds B to demonstrate improved balance Baseline:  Goal status: INITIAL  2.  Pt will be able to perform 9 sit to stands in 30 seconds for average score to decrease fall risk Baseline:  Goal status: INITIAL  3.  Pt will be able to perform TUG with rollator in 24 seconds or less for improved gait speed and decreased fall risk Baseline:  Goal status: INITIAL  4.  Pt will be independent in more advanced HEP Baseline:  Goal status: INITIAL   PLAN:  PT FREQUENCY: 2x/week  PT DURATION: 6 weeks  PLANNED INTERVENTIONS: Therapeutic exercises, Therapeutic activity, Neuromuscular re-education, Balance training, Gait training, Patient/Family education, Self Care, and Manual therapy  PLAN FOR NEXT SESSION:  Initiate HEP for LE strength, progress balance, function, gait   Waynette Buttery, PT 07/12/2023, 12:00 PM

## 2023-07-13 ENCOUNTER — Other Ambulatory Visit: Payer: Medicare Other

## 2023-07-13 ENCOUNTER — Inpatient Hospital Stay: Payer: Medicare Other

## 2023-07-13 ENCOUNTER — Inpatient Hospital Stay (HOSPITAL_BASED_OUTPATIENT_CLINIC_OR_DEPARTMENT_OTHER): Payer: Medicare Other | Admitting: Physician Assistant

## 2023-07-13 ENCOUNTER — Encounter: Payer: Self-pay | Admitting: Hematology and Oncology

## 2023-07-13 ENCOUNTER — Ambulatory Visit: Payer: Medicare Other

## 2023-07-13 ENCOUNTER — Ambulatory Visit: Payer: Medicare Other | Admitting: Physician Assistant

## 2023-07-13 ENCOUNTER — Telehealth: Payer: Self-pay | Admitting: *Deleted

## 2023-07-13 VITALS — BP 143/66 | HR 59 | Temp 98.1°F | Resp 16 | Ht 63.0 in | Wt 152.2 lb

## 2023-07-13 DIAGNOSIS — R600 Localized edema: Secondary | ICD-10-CM | POA: Diagnosis not present

## 2023-07-13 DIAGNOSIS — G629 Polyneuropathy, unspecified: Secondary | ICD-10-CM | POA: Diagnosis not present

## 2023-07-13 DIAGNOSIS — R300 Dysuria: Secondary | ICD-10-CM | POA: Diagnosis not present

## 2023-07-13 DIAGNOSIS — I959 Hypotension, unspecified: Secondary | ICD-10-CM | POA: Diagnosis not present

## 2023-07-13 DIAGNOSIS — Z79899 Other long term (current) drug therapy: Secondary | ICD-10-CM | POA: Diagnosis not present

## 2023-07-13 DIAGNOSIS — C9000 Multiple myeloma not having achieved remission: Secondary | ICD-10-CM

## 2023-07-13 DIAGNOSIS — I4891 Unspecified atrial fibrillation: Secondary | ICD-10-CM | POA: Diagnosis not present

## 2023-07-13 DIAGNOSIS — Z5112 Encounter for antineoplastic immunotherapy: Secondary | ICD-10-CM | POA: Diagnosis not present

## 2023-07-13 DIAGNOSIS — R059 Cough, unspecified: Secondary | ICD-10-CM | POA: Diagnosis not present

## 2023-07-13 DIAGNOSIS — D649 Anemia, unspecified: Secondary | ICD-10-CM | POA: Diagnosis not present

## 2023-07-13 DIAGNOSIS — E538 Deficiency of other specified B group vitamins: Secondary | ICD-10-CM | POA: Diagnosis not present

## 2023-07-13 DIAGNOSIS — Z7901 Long term (current) use of anticoagulants: Secondary | ICD-10-CM | POA: Diagnosis not present

## 2023-07-13 LAB — CBC WITH DIFFERENTIAL (CANCER CENTER ONLY)
Abs Immature Granulocytes: 0.02 10*3/uL (ref 0.00–0.07)
Basophils Absolute: 0.1 10*3/uL (ref 0.0–0.1)
Basophils Relative: 1 %
Eosinophils Absolute: 0.3 10*3/uL (ref 0.0–0.5)
Eosinophils Relative: 6 %
HCT: 39.4 % (ref 36.0–46.0)
Hemoglobin: 13.5 g/dL (ref 12.0–15.0)
Immature Granulocytes: 0 %
Lymphocytes Relative: 7 %
Lymphs Abs: 0.4 10*3/uL — ABNORMAL LOW (ref 0.7–4.0)
MCH: 30.1 pg (ref 26.0–34.0)
MCHC: 34.3 g/dL (ref 30.0–36.0)
MCV: 87.9 fL (ref 80.0–100.0)
Monocytes Absolute: 0.9 10*3/uL (ref 0.1–1.0)
Monocytes Relative: 16 %
Neutro Abs: 3.9 10*3/uL (ref 1.7–7.7)
Neutrophils Relative %: 70 %
Platelet Count: 108 10*3/uL — ABNORMAL LOW (ref 150–400)
RBC: 4.48 MIL/uL (ref 3.87–5.11)
RDW: 18.7 % — ABNORMAL HIGH (ref 11.5–15.5)
WBC Count: 5.6 10*3/uL (ref 4.0–10.5)
nRBC: 0 % (ref 0.0–0.2)

## 2023-07-13 LAB — URINALYSIS, COMPLETE (UACMP) WITH MICROSCOPIC
Bilirubin Urine: NEGATIVE
Glucose, UA: >=500 mg/dL — AB
Hgb urine dipstick: NEGATIVE
Ketones, ur: NEGATIVE mg/dL
Nitrite: NEGATIVE
Protein, ur: NEGATIVE mg/dL
Specific Gravity, Urine: 1.03 (ref 1.005–1.030)
WBC, UA: >50 WBC/hpf (ref 0–5)
pH: 5 (ref 5.0–8.0)

## 2023-07-13 LAB — CMP (CANCER CENTER ONLY)
ALT: 11 U/L (ref 0–44)
AST: 13 U/L — ABNORMAL LOW (ref 15–41)
Albumin: 3.8 g/dL (ref 3.5–5.0)
Alkaline Phosphatase: 66 U/L (ref 38–126)
Anion gap: 8 (ref 5–15)
BUN: 23 mg/dL (ref 8–23)
CO2: 27 mmol/L (ref 22–32)
Calcium: 8.8 mg/dL — ABNORMAL LOW (ref 8.9–10.3)
Chloride: 99 mmol/L (ref 98–111)
Creatinine: 0.95 mg/dL (ref 0.44–1.00)
GFR, Estimated: 58 mL/min — ABNORMAL LOW (ref >60)
Glucose, Bld: 103 mg/dL — ABNORMAL HIGH (ref 70–99)
Potassium: 3.9 mmol/L (ref 3.5–5.1)
Sodium: 134 mmol/L — ABNORMAL LOW (ref 135–145)
Total Bilirubin: 0.7 mg/dL (ref 0.3–1.2)
Total Protein: 5.7 g/dL — ABNORMAL LOW (ref 6.5–8.1)

## 2023-07-13 LAB — LACTATE DEHYDROGENASE: LDH: 233 U/L — ABNORMAL HIGH (ref 98–192)

## 2023-07-13 MED ORDER — ACETAMINOPHEN 325 MG PO TABS
650.0000 mg | ORAL_TABLET | Freq: Once | ORAL | Status: DC
  Filled 2023-07-13: qty 2

## 2023-07-13 MED ORDER — LORATADINE 10 MG PO TABS
10.0000 mg | ORAL_TABLET | Freq: Once | ORAL | Status: AC
  Administered 2023-07-13: 10 mg via ORAL
  Filled 2023-07-13: qty 1

## 2023-07-13 MED ORDER — PHENAZOPYRIDINE HCL 100 MG PO TABS: 100.0000 mg | ORAL_TABLET | Freq: Three times a day (TID) | ORAL | 0 refills | Status: DC | PRN

## 2023-07-13 MED ORDER — DARATUMUMAB-HYALURONIDASE-FIHJ 1800-30000 MG-UT/15ML ~~LOC~~ SOLN
1800.0000 mg | Freq: Once | SUBCUTANEOUS | Status: AC
  Administered 2023-07-13: 1800 mg via SUBCUTANEOUS
  Filled 2023-07-13: qty 15

## 2023-07-13 NOTE — Patient Instructions (Addendum)
[facility] AT [facility]  Discharge Instructions: Thank you for choosing [facility] to provide your oncology and hematology care.   If you have a lab appointment with the Cancer Center, please go directly to the Cancer Center and check in at the registration area.   Wear comfortable clothing and clothing appropriate for easy access to any Portacath or PICC line.   We strive to give you quality time with your provider. You may need to reschedule your appointment if you arrive late (15 or more minutes).  Arriving late affects you and other patients whose appointments are after yours.  Also, if you miss three or more appointments without notifying the office, you may be dismissed from the clinic at the provider's discretion.      For prescription refill requests, have your pharmacy contact our office and allow 72 hours for refills to be completed.    Today you received the following chemotherapy and/or immunotherapy agents Darzalex Faspro      To help prevent nausea and vomiting after your treatment, we encourage you to take your nausea medication as directed.  BELOW ARE SYMPTOMS THAT SHOULD BE REPORTED IMMEDIATELY: *FEVER GREATER THAN 100.4 F (38 C) OR HIGHER *CHILLS OR SWEATING *NAUSEA AND VOMITING THAT IS NOT CONTROLLED WITH YOUR NAUSEA MEDICATION *UNUSUAL SHORTNESS OF BREATH *UNUSUAL BRUISING OR BLEEDING *URINARY PROBLEMS (pain or burning when urinating, or frequent urination) *BOWEL PROBLEMS (unusual diarrhea, constipation, pain near the anus) TENDERNESS IN MOUTH AND THROAT WITH OR WITHOUT PRESENCE OF ULCERS (sore throat, sores in mouth, or a toothache) UNUSUAL RASH, SWELLING OR PAIN  UNUSUAL VAGINAL DISCHARGE OR ITCHING   Items with * indicate a potential emergency and should be followed up as soon as possible or go to the Emergency Department if any problems should occur.  Please show the CHEMOTHERAPY ALERT CARD or IMMUNOTHERAPY ALERT CARD at  check-in to the Emergency Department and triage nurse.  Should you have questions after your visit or need to cancel or reschedule your appointment, please contact [facility] [address] [facility]  Dept: [phone]  and follow the prompts.  Office hours are 8:00 a.m. to 4:30 p.m. Monday - Friday. Please note that voicemails left after 4:00 p.m. may not be returned until the following business day.  We are closed weekends and major holidays. You have access to a nurse at all times for urgent questions. Please call the main number to the clinic Dept: [phone] and follow the prompts.   For any non-urgent questions, you may also contact your provider using MyChart. We now offer e-Visits for anyone 10 and older to request care online for non-urgent symptoms. For details visit [email or web address].   Also download the MyChart app! Go to the app store, search "MyChart", open the app, select [place], and log in with your MyChart username and password.

## 2023-07-13 NOTE — Telephone Encounter (Signed)
Received call from pt inquiring about her u/a results. Per [name], PA-those results are not definitive and we will need to wait for the culture results. They will be available early next week. Encouraged pt to drink more water as tolerated, add cranberry juice to her fluids Alos advised that we have sent ia prescription for pyridium . This will help "numb" her bladder so it won't burn when she passes her urine. Advised that this medication will turn her urine a very bright orange and not to be alarmed by that, Pt voiced understanding to the above instructions.

## 2023-07-13 NOTE — Progress Notes (Signed)
[facility] [phone]   [phone]   PROGRESS NOTE   Patient Care Team: [facility] as PCP - General (Rheumatology)   Hematological/Oncological History # IgG Lambda Multiple Myeloma  06/14/2022: establish care with Zoe [name] due to anemia. Labs showed M protein 4.9, Kappa 7.2, Lambda 10.8, ratio 0.67 07/13/2022: Bmbx showed Lambda restricted plasma cell neoplasm involving approximately 60% of the cellular marrow by IHC on the biopsy.  08/01/2022: Cycle 1 Day 1 of VRd chemotherapy. (Holding revlimid initially) 08/21/2022:  Cycle 2 Day 1 of VRd chemotherapy. (Holding revlimid) 09/04/2022: Cycle 3 Day 1 of VRd chemotherapy. Started revlimid 10/03/2022: Cycle 4 Day 1 of VRd chemotherapy 10/31/2022: Cycle 5 Day 1 of VRd chemotherapy 11/20/2022: Cycle 6 Day 1 of VRd chemotherapy 12/11/2022: Cycle 7 Day 1 of VRd chemotherapy 01/02/2023: Cycle 8 Day 1 of VRd chemotherapy 01/22/2023: Cycle 9 Day 1 of VRd chemotherapy 02/23/2023: Day 1 Cycle 1 of Dara/Rev/Dex 03/23/2023: Day 1 Cycle 2 of Dara/Rev/Dex 05/06/2023: chemotherapy on pause due to chest pain/cardiac/respiratory issues.  05/18/2023: Cycle 3 Day 1 of Dara/Rev/Dex 06/15/2023: Cycle 4 Day 1 of Dara/Rev/Dex 07/13/2023: Cycle 5 Day 1 of Dara/Rev/Dex   INTERVAL HISTORY: Zoe [name] 89 y.o. female returns to the clinic today for a follow-up visit accompanied by her daughter-in-law. Her last visit was on 06/29/2023. In the interim, she continued on Dara/Rev/Dex.   [name] reports she is tolerating treatment without any significant limitations.  She reports 1 episode of grogginess/disorientation.  She adds that she is having burning with urination and increased frequency.  Her energy levels are fairly stable.  She recently started physical therapy to help with her balance issues.  She uses a walker or cane at home to help with ambulation.  She denies nausea, vomiting or bowel habit  changes.  Patient denies any new bone pain or back pain.  She notes she is willing and able to continue treatment at this time.  She denies any fevers, chills, sweats, shortness of breath, chest pain or cough.  She has no other complaints.  A full 10 point ROS is otherwise negative.  MEDICAL HISTORY: Past Medical History:  Diagnosis Date   Arthritis    some - per patient   Breast cancer (HCC)    breast cancer / left    Cataract    bilat    GERD (gastroesophageal reflux disease)    History of kidney stones    Hyperlipidemia    Hypertension    Hypothyroidism    Macular degeneration    Left   S/P TAVR (transcatheter aortic valve replacement) 09/03/2018   23 mm Edwards Sapien 3 transcatheter heart valve placed via percutaneous right transfemoral approach    Severe aortic stenosis    Stress incontinence    Thyroid disease    Tinnitus     ALLERGIES:  is allergic to quinolones, penicillins, and sulfa antibiotics.  MEDICATIONS:  Current Outpatient Medications  Medication Sig Dispense Refill   acetaminophen (TYLENOL) 500 MG tablet Take 500 mg by mouth every 6 (six) hours as needed for moderate pain.     acyclovir (ZOVIRAX) 400 MG tablet Take 1 tablet (400 mg total) by mouth 2 (two) times daily. 60 tablet 3   amiodarone (PACERONE) 200 MG tablet Take 1 tablet (200 mg total) by mouth daily. 45 tablet 1   apixaban (ELIQUIS) 5 MG TABS tablet Take 1 tablet (5 mg total) by mouth 2 (two) times daily. 180 tablet 1  Artificial Tear Solution (SOOTHE XP) SOLN Place 1 drop into both eyes every evening.     Cholecalciferol (VITAMIN D3) 50 MCG (2000 UT) capsule Take 1 capsule (2,000 Units total) by mouth daily.     [employer] VITAMIN B12 1000 MCG tablet TAKE 1 TABLET BY MOUTH EVERY DAY 90 tablet 1   dapagliflozin propanediol (FARXIGA) 10 MG TABS tablet TAKE 1 TABLET BY MOUTH EVERY DAY 90 tablet 2   dexamethasone (DECADRON) 4 MG tablet Take 5 tablets (20 mg total) by mouth every 14 (fourteen) days. Take in  the morning with food on treatment days 30 tablet 2   esomeprazole (NEXIUM) 20 MG capsule Take 20 mg by mouth daily as needed (Heartburn).     lenalidomide (REVLIMID) 25 MG capsule Take 1 capsule (25 mg total) by mouth daily. Siri Cole #[id number]  Date Obtained 06/28/23 Take one capsule daily for 21 days none for 7 21 capsule 0   levothyroxine (SYNTHROID, LEVOTHROID) 100 MCG tablet Take 100 mcg by mouth daily before breakfast.  2   metoprolol tartrate (LOPRESSOR) 50 MG tablet TAKE 1 TABLET BY MOUTH TWICE A DAY 60 tablet 1   Multiple Vitamins-Minerals (PRESERVISION AREDS) CAPS Take 1 capsule by mouth 2 (two) times daily.     OVER THE COUNTER MEDICATION Take 1 capsule by mouth in the morning and at bedtime. AREDS     polyethylene glycol (MIRALAX / GLYCOLAX) 17 g packet Take 17 g by mouth daily as needed for mild constipation.     pravastatin (PRAVACHOL) 40 MG tablet Take 40 mg by mouth every evening.     albuterol (VENTOLIN HFA) 108 (90 Base) MCG/ACT inhaler Inhale 2 puffs into the lungs every 6 (six) hours as needed for wheezing or shortness of breath. (Patient not taking: Reported on 05/17/2023) 8 g 2   phenazopyridine (PYRIDIUM) 100 MG tablet Take 1 tablet (100 mg total) by mouth 3 (three) times daily as needed for pain. 10 tablet 0   sennosides-docusate sodium (SENOKOT-S) 8.6-50 MG tablet Take 1 tablet by mouth daily as needed for constipation. (Patient not taking: Reported on 06/29/2023)     No current facility-administered medications for this visit.   Facility-Administered Medications Ordered in Other Visits  Medication Dose Route Frequency Provider Last Rate Last Admin   acetaminophen (TYLENOL) tablet 650 mg  650 mg Oral Once Jaci Standard, MD        SURGICAL HISTORY:  Past Surgical History:  Procedure Laterality Date   ABDOMINAL HYSTERECTOMY  1970's   BACK SURGERY     BREAST LUMPECTOMY  12/1998   lumpectomy   CARDIAC CATHETERIZATION     CARDIOVERSION N/A 04/18/2023   Procedure:  CARDIOVERSION;  Surgeon: Tessa Lerner, DO;  Location: [facility] INVASIVE CV LAB;  Service: Cardiovascular;  Laterality: N/A;   CARDIOVERSION N/A 04/20/2023   Procedure: CARDIOVERSION;  Surgeon: Yates Decamp, MD;  Location: [facility] INVASIVE CV LAB;  Service: Cardiovascular;  Laterality: N/A;   EYE SURGERY     cataract surgery bilat    INTRAOPERATIVE TRANSTHORACIC ECHOCARDIOGRAM N/A 09/03/2018   Procedure: INTRAOPERATIVE TRANSTHORACIC ECHOCARDIOGRAM;  Surgeon: Kathleene Hazel, MD;  Location: [facility] OR;  Service: Open Heart Surgery;  Laterality: N/A;   KYPHOPLASTY N/A 09/07/2022   Procedure: THORACIC EIGHT KYPHOPLASTY;  Surgeon: Venita Lick, MD;  Location: [facility] OR;  Service: Orthopedics;  Laterality: N/A;  1 hr Local with [facility] 3 C-Bed   LITHOTRIPSY     Right total knee     2018 Dr. Charlann Boxer  RIGHT/LEFT HEART CATH AND CORONARY ANGIOGRAPHY N/A 08/06/2018   Procedure: RIGHT/LEFT HEART CATH AND CORONARY ANGIOGRAPHY;  Surgeon: Yates Decamp, MD;  Location: [facility] INVASIVE CV LAB;  Service: Cardiovascular;  Laterality: N/A;   THYROIDECTOMY, PARTIAL  1975   TONSILLECTOMY     as a child - patient not sure of exact date   TOTAL KNEE ARTHROPLASTY Left 03/13/2016   Procedure: TOTAL KNEE ARTHROPLASTY;  Surgeon: Durene Romans, MD;  Location: [facility] ORS;  Service: Orthopedics;  Laterality: Left;   TOTAL KNEE ARTHROPLASTY Right 06/18/2017   Procedure: RIGHT TOTAL KNEE ARTHROPLASTY;  Surgeon: Durene Romans, MD;  Location: [facility] ORS;  Service: Orthopedics;  Laterality: Right;   TRANSCATHETER AORTIC VALVE REPLACEMENT, TRANSFEMORAL N/A 09/03/2018   Procedure: TRANSCATHETER AORTIC VALVE REPLACEMENT, TRANSFEMORAL;  Surgeon: Kathleene Hazel, MD;  Location: [facility] OR;  Service: Open Heart Surgery;  Laterality: N/A;   REVIEW OF SYSTEMS:   Constitutional: ( - ) fevers, ( - )  chills , ( - ) night sweats Eyes: ( - ) blurriness of vision, ( - ) double vision, ( - ) watery eyes Ears, nose, mouth, throat, and face: ( - ) mucositis, ( - )  sore throat Respiratory: ( -) cough, ( - ) dyspnea, ( - ) wheezes Cardiovascular: ( - ) palpitation, ( - ) chest discomfort, ( +) lower extremity swelling Gastrointestinal:  ( - ) nausea, ( - ) heartburn, ( - ) change in bowel habits Skin: ( - ) abnormal skin rashes Lymphatics: ( - ) new lymphadenopathy, ( - ) easy bruising Neurological: ( - ) numbness, ( - ) tingling, ( - ) new weaknesses Behavioral/Psych: ( - ) mood change, ( - ) new changes  All other systems were reviewed with the patient and are negative.   PHYSICAL EXAMINATION:  Blood pressure (!) 143/66, pulse (!) 59, temperature 98.1 F (36.7 C), temperature source Oral, resp. rate 16, height 5\' 3"  (1.6 m), weight 152 lb 3.2 oz (69 kg), SpO2 100%.  ECOG PERFORMANCE STATUS: 1  GENERAL: well appearing elderly Caucasian female, alert, no distress and comfortable SKIN: skin color, texture, turgor are normal, no rashes or significant lesions EYES: conjunctiva are pink and non-injected, sclera clear LUNGS:normal breathing effort. Mild wheezing and crackles in lower lobes b/l.  HEART: irregular rhythm ,normal rate and no murmurs. +1 bilateral lower extremity edema, mainly in ankle and lower calf.  Musculoskeletal: no cyanosis of digits and no clubbing  PSYCH: alert & oriented x 3, fluent speech NEURO: no focal motor/sensory deficits    LABORATORY DATA: Lab Results  Component Value Date   WBC 5.6 07/13/2023   HGB 13.5 07/13/2023   HCT 39.4 07/13/2023   MCV 87.9 07/13/2023   PLT 108 (L) 07/13/2023      Chemistry      Component Value Date/Time   NA 134 (L) 07/13/2023 0926   NA 130 (L) 06/19/2022 1012   K 3.9 07/13/2023 0926   CL 99 07/13/2023 0926   CO2 27 07/13/2023 0926   BUN 23 07/13/2023 0926   BUN 21 06/19/2022 1012   CREATININE 0.95 07/13/2023 0926      Component Value Date/Time   CALCIUM 8.8 (L) 07/13/2023 0926   ALKPHOS 66 07/13/2023 0926   AST 13 (L) 07/13/2023 0926   ALT 11 07/13/2023 0926   BILITOT 0.7  07/13/2023 0926       RADIOGRAPHIC STUDIES:  No results found.   ASSESSMENT/PLAN:  KINSLEE BURKINS is a 89 y.o. female who presents  to the clinic for evaluation for IgG lambda multiple myeloma.   # IgG Lambda Multiple Myeloma  --diagnosis of MM confirmed with bone marrow biopsy showing 60% plasma cells and anemia -- Bone survey shows no lytic lesions, kidney function is within normal limits.  --08/01/2022 was Cycle 1 Day 1 of VRd  -Dr. Leonides Schanz discontinued velcade on 02/05/23 due to rash. She is status post day 15 cycle #9.  -Dr. Leonides Schanz changed the care plan to Daratumumab, Revlimid, and decadron. She is scheduled for day 1 cycle #1 today  -Started Cycle 1, Day 1 of Dara/Rev/Dex on 02/23/2023.  Plan:  --Due for Cycle 5 Day 1 of Dara/Rev/Dex today --Labs show white blood cell count white blood cell 5.6, hemoglobin 13.5, MCV 87.9, and platelets of 108. --last M protein from 06/15/2023 trended down to 0.1 (4.9 prior to start of therapy). Normalized SFLC.  --Will order a Dara IFE study today.  If M protein is undetectable, we will plan to transition to maintenance therapy with Revlimid 10 mg p.o. daily 21 days on and 7 days off.   --RTC in 2 weeks with interval weekly treatment.   #Dysuria/increased urinary frequency: --Will check UA/culture to evaluate for UTI.  --Okay to take Pyridium for burning sensation until urine studies finalize.   #Hypotension/BP/Atrial Fibrillation  --Follows with cardiology, Dr. Jacinto Halim -- Recommendations include metoprolol succinate 25 mg PO daily and restarted patient back on amiodarone 200 mg PO daily x 10 days followed by 12 mg daily.  --Continue on Eliquis 5 mg BID   #Bilateral lower extremity edema:  --Continue to wear compression stockings.    # Normocytic Anemia #Vitamin B12 Deficiency --Anemia likely driven by multiple myeloma, but may be component of vitamin B12 deficiency as well. --initial labs showed elevated MMA with B12 180 -- Continue  vitamin B12 1000 mcg p.o. daily -- Continue to monitor   #Left leg/thigh neuropathy--improving: --Currently on gabapentin 900 mg nightly and 600 mg in the morning --Encouraged to try water aerobics and stretches.  --Following with Dr. Debbe Bales (ortho) to see if she is a candidate for steroid injections.  -- Patient evaluated by Dr. Barbaraann Cao thinks that this may be neuropathy worsened by her Velcade therapy.   #Episode of right UE tremor and speech abnormality: --Evaluated by Dr. Barbaraann Cao on 03/20/2023. Presumed etiology had been atrial fibrillation, but vascular disease, atheromatous changes, are also possibly implicated concurrently.  --MRI brain from 03/13/2023 show no acute finding including no evidence of intracranial neoplastic disease. There are chronic small-vessel ischemic changes of the pons, cerebral hemispheric white matter and right cerebellum. --Educated patient to seek immediate evaluate if there are repeat episodes. --No neurological deficits identified per exam today  #Supportive Care -- chemotherapy education complete.  -- port placement not required.  --Awaiting to start Zometa/Xgeva  -- zofran 8mg  q8H PRN and compazine 10mg  PO q6H for nausea -- acyclovir 400mg  PO BID for VCZ prophylaxis -- tylenol 1000 mg q8H PRN for back pain.   Orders Placed This Encounter  Procedures   Urine Culture    Standing Status:   Future    Number of Occurrences:   1    Standing Expiration Date:   07/12/2024   Urinalysis, Complete w Microscopic    Standing Status:   Future    Number of Occurrences:   1    Standing Expiration Date:   07/12/2024    Patient expressed understanding of the plan provided.   I have spent a total of 30 minutes minutes  of face-to-face and non-face-to-face time, preparing to see the patient, performing a medically appropriate examination, counseling and educating the patient, ordering tests/procedures, documenting clinical information in the electronic health record, and  care coordination.   Zoe Kaufmann PA-C Dept of Hematology and Oncology [facility] Phone: [phone]

## 2023-07-13 NOTE — Progress Notes (Signed)
Patient states she took her dexamethasone at home today around 0800.

## 2023-07-15 LAB — URINE CULTURE: Culture: >=100000 — AB

## 2023-07-16 ENCOUNTER — Other Ambulatory Visit: Payer: Self-pay

## 2023-07-16 ENCOUNTER — Telehealth: Payer: Self-pay | Admitting: Cardiology

## 2023-07-16 ENCOUNTER — Observation Stay (HOSPITAL_COMMUNITY): Payer: Medicare Other

## 2023-07-16 ENCOUNTER — Encounter (HOSPITAL_COMMUNITY): Payer: Self-pay | Admitting: Emergency Medicine

## 2023-07-16 ENCOUNTER — Observation Stay (HOSPITAL_COMMUNITY)
Admission: EM | Admit: 2023-07-16 | Discharge: 2023-07-17 | Disposition: A | Discharge status: Home / Self Care | Payer: Medicare Other | Attending: Internal Medicine | Admitting: Internal Medicine

## 2023-07-16 ENCOUNTER — Telehealth: Payer: Self-pay

## 2023-07-16 DIAGNOSIS — I5030 Unspecified diastolic (congestive) heart failure: Secondary | ICD-10-CM | POA: Diagnosis present

## 2023-07-16 DIAGNOSIS — Z79899 Other long term (current) drug therapy: Secondary | ICD-10-CM | POA: Insufficient documentation

## 2023-07-16 DIAGNOSIS — I499 Cardiac arrhythmia, unspecified: Secondary | ICD-10-CM | POA: Diagnosis not present

## 2023-07-16 DIAGNOSIS — R079 Chest pain, unspecified: Secondary | ICD-10-CM | POA: Diagnosis not present

## 2023-07-16 DIAGNOSIS — I4891 Unspecified atrial fibrillation: Secondary | ICD-10-CM | POA: Diagnosis not present

## 2023-07-16 DIAGNOSIS — I480 Paroxysmal atrial fibrillation: Secondary | ICD-10-CM | POA: Diagnosis present

## 2023-07-16 DIAGNOSIS — B961 Klebsiella pneumoniae [K. pneumoniae] as the cause of diseases classified elsewhere: Secondary | ICD-10-CM | POA: Diagnosis not present

## 2023-07-16 DIAGNOSIS — R55 Syncope and collapse: Secondary | ICD-10-CM | POA: Insufficient documentation

## 2023-07-16 DIAGNOSIS — Z96653 Presence of artificial knee joint, bilateral: Secondary | ICD-10-CM | POA: Insufficient documentation

## 2023-07-16 DIAGNOSIS — Z85828 Personal history of other malignant neoplasm of skin: Secondary | ICD-10-CM | POA: Diagnosis not present

## 2023-07-16 DIAGNOSIS — I160 Hypertensive urgency: Secondary | ICD-10-CM | POA: Diagnosis not present

## 2023-07-16 DIAGNOSIS — I350 Nonrheumatic aortic (valve) stenosis: Secondary | ICD-10-CM

## 2023-07-16 DIAGNOSIS — I110 Hypertensive heart disease with heart failure: Secondary | ICD-10-CM | POA: Diagnosis not present

## 2023-07-16 DIAGNOSIS — K219 Gastro-esophageal reflux disease without esophagitis: Secondary | ICD-10-CM | POA: Diagnosis present

## 2023-07-16 DIAGNOSIS — Z7901 Long term (current) use of anticoagulants: Secondary | ICD-10-CM | POA: Diagnosis not present

## 2023-07-16 DIAGNOSIS — I10 Essential (primary) hypertension: Secondary | ICD-10-CM

## 2023-07-16 DIAGNOSIS — N390 Urinary tract infection, site not specified: Secondary | ICD-10-CM | POA: Diagnosis not present

## 2023-07-16 DIAGNOSIS — C9000 Multiple myeloma not having achieved remission: Secondary | ICD-10-CM | POA: Diagnosis present

## 2023-07-16 DIAGNOSIS — Z743 Need for continuous supervision: Secondary | ICD-10-CM | POA: Diagnosis not present

## 2023-07-16 DIAGNOSIS — I5031 Acute diastolic (congestive) heart failure: Secondary | ICD-10-CM | POA: Diagnosis not present

## 2023-07-16 DIAGNOSIS — R000 Tachycardia, unspecified: Secondary | ICD-10-CM | POA: Diagnosis not present

## 2023-07-16 DIAGNOSIS — Z952 Presence of prosthetic heart valve: Secondary | ICD-10-CM | POA: Diagnosis not present

## 2023-07-16 DIAGNOSIS — B9689 Other specified bacterial agents as the cause of diseases classified elsewhere: Secondary | ICD-10-CM | POA: Diagnosis present

## 2023-07-16 DIAGNOSIS — E039 Hypothyroidism, unspecified: Secondary | ICD-10-CM | POA: Diagnosis not present

## 2023-07-16 DIAGNOSIS — E876 Hypokalemia: Secondary | ICD-10-CM | POA: Diagnosis not present

## 2023-07-16 DIAGNOSIS — R7989 Other specified abnormal findings of blood chemistry: Secondary | ICD-10-CM | POA: Diagnosis present

## 2023-07-16 DIAGNOSIS — Z853 Personal history of malignant neoplasm of breast: Secondary | ICD-10-CM | POA: Insufficient documentation

## 2023-07-16 DIAGNOSIS — R0789 Other chest pain: Secondary | ICD-10-CM | POA: Diagnosis not present

## 2023-07-16 DIAGNOSIS — E785 Hyperlipidemia, unspecified: Secondary | ICD-10-CM | POA: Diagnosis not present

## 2023-07-16 DIAGNOSIS — I5032 Chronic diastolic (congestive) heart failure: Secondary | ICD-10-CM | POA: Diagnosis not present

## 2023-07-16 DIAGNOSIS — R6889 Other general symptoms and signs: Secondary | ICD-10-CM | POA: Diagnosis not present

## 2023-07-16 LAB — BASIC METABOLIC PANEL
Anion gap: 13 (ref 5–15)
BUN: 23 mg/dL (ref 8–23)
CO2: 23 mmol/L (ref 22–32)
Calcium: 8.4 mg/dL — ABNORMAL LOW (ref 8.9–10.3)
Chloride: 102 mmol/L (ref 98–111)
Creatinine, Ser: 0.84 mg/dL (ref 0.44–1.00)
GFR, Estimated: >60 mL/min (ref >60)
Glucose, Bld: 104 mg/dL — ABNORMAL HIGH (ref 70–99)
Potassium: 3.1 mmol/L — ABNORMAL LOW (ref 3.5–5.1)
Sodium: 138 mmol/L (ref 135–145)

## 2023-07-16 LAB — CBC
HCT: 42.5 % (ref 36.0–46.0)
Hemoglobin: 13.6 g/dL (ref 12.0–15.0)
MCH: 28.9 pg (ref 26.0–34.0)
MCHC: 32 g/dL (ref 30.0–36.0)
MCV: 90.2 fL (ref 80.0–100.0)
Platelets: UNDETERMINED 10*3/uL (ref 150–400)
RBC: 4.71 MIL/uL (ref 3.87–5.11)
RDW: 18.7 % — ABNORMAL HIGH (ref 11.5–15.5)
WBC: 5.2 10*3/uL (ref 4.0–10.5)
nRBC: 0 % (ref 0.0–0.2)

## 2023-07-16 LAB — KAPPA/LAMBDA LIGHT CHAINS
Kappa free light chain: 7.4 mg/L (ref 3.3–19.4)
Kappa, lambda light chain ratio: 1.3 (ref 0.26–1.65)
Lambda free light chains: 5.7 mg/L (ref 5.7–26.3)

## 2023-07-16 LAB — TROPONIN I (HIGH SENSITIVITY)
Troponin I (High Sensitivity): 110 ng/L (ref <18)
Troponin I (High Sensitivity): 178 ng/L (ref <18)
Troponin I (High Sensitivity): 136 ng/L (ref <18)
Troponin I (High Sensitivity): 150 ng/L (ref <18)

## 2023-07-16 MED ORDER — ALBUTEROL SULFATE (2.5 MG/3ML) 0.083% IN NEBU: 2.5000 mg | INHALATION_SOLUTION | Freq: Four times a day (QID) | RESPIRATORY_TRACT | Status: DC | PRN

## 2023-07-16 MED ORDER — METOPROLOL TARTRATE 50 MG PO TABS
50.0000 mg | ORAL_TABLET | Freq: Two times a day (BID) | ORAL | Status: DC
  Administered 2023-07-16 – 2023-07-17 (×2): 50 mg via ORAL
  Filled 2023-07-16 (×2): qty 1

## 2023-07-16 MED ORDER — SENNOSIDES-DOCUSATE SODIUM 8.6-50 MG PO TABS: 1.0000 | ORAL_TABLET | Freq: Every day | ORAL | Status: DC | PRN

## 2023-07-16 MED ORDER — ACETAMINOPHEN 325 MG PO TABS
650.0000 mg | ORAL_TABLET | Freq: Four times a day (QID) | ORAL | Status: DC | PRN
  Administered 2023-07-16: 650 mg via ORAL
  Filled 2023-07-16: qty 2

## 2023-07-16 MED ORDER — SPIRONOLACTONE 25 MG PO TABS
25.0000 mg | ORAL_TABLET | Freq: Every day | ORAL | Status: DC
  Administered 2023-07-16 – 2023-07-17 (×2): 25 mg via ORAL
  Filled 2023-07-16 (×2): qty 1

## 2023-07-16 MED ORDER — APIXABAN 5 MG PO TABS
5.0000 mg | ORAL_TABLET | Freq: Two times a day (BID) | ORAL | Status: AC
  Administered 2023-07-16: 5 mg via ORAL
  Filled 2023-07-16: qty 1

## 2023-07-16 MED ORDER — APIXABAN 5 MG PO TABS
5.0000 mg | ORAL_TABLET | Freq: Two times a day (BID) | ORAL | Status: DC
  Administered 2023-07-16 – 2023-07-17 (×2): 5 mg via ORAL
  Filled 2023-07-16 (×2): qty 1

## 2023-07-16 MED ORDER — SODIUM CHLORIDE 0.9 % IV SOLN
1.0000 g | INTRAVENOUS | Status: DC
  Administered 2023-07-17: 1 g via INTRAVENOUS
  Filled 2023-07-16: qty 10

## 2023-07-16 MED ORDER — LENALIDOMIDE 25 MG PO CAPS: 25.0000 mg | ORAL_CAPSULE | Freq: Every day | ORAL | Status: DC

## 2023-07-16 MED ORDER — DAPAGLIFLOZIN PROPANEDIOL 10 MG PO TABS
10.0000 mg | ORAL_TABLET | Freq: Every day | ORAL | Status: DC
  Administered 2023-07-16: 10 mg via ORAL
  Filled 2023-07-16: qty 1

## 2023-07-16 MED ORDER — CALCIUM GLUCONATE-NACL 1-0.675 GM/50ML-% IV SOLN
1.0000 g | Freq: Once | INTRAVENOUS | Status: AC
  Administered 2023-07-16: 1000 mg via INTRAVENOUS
  Filled 2023-07-16: qty 50

## 2023-07-16 MED ORDER — POLYETHYLENE GLYCOL 3350 17 G PO PACK: 17.0000 g | PACK | Freq: Every day | ORAL | Status: DC | PRN

## 2023-07-16 MED ORDER — PRAVASTATIN SODIUM 40 MG PO TABS
40.0000 mg | ORAL_TABLET | Freq: Every evening | ORAL | Status: DC
  Administered 2023-07-16: 40 mg via ORAL
  Filled 2023-07-16: qty 1

## 2023-07-16 MED ORDER — POTASSIUM CHLORIDE CRYS ER 20 MEQ PO TBCR
40.0000 meq | EXTENDED_RELEASE_TABLET | Freq: Once | ORAL | Status: AC
  Administered 2023-07-16: 40 meq via ORAL
  Filled 2023-07-16: qty 2

## 2023-07-16 MED ORDER — SODIUM CHLORIDE 0.9 % IV SOLN
1.0000 g | INTRAVENOUS | Status: AC
  Administered 2023-07-16: 1 g via INTRAVENOUS
  Filled 2023-07-16: qty 10

## 2023-07-16 MED ORDER — METOPROLOL TARTRATE 25 MG PO TABS
50.0000 mg | ORAL_TABLET | Freq: Two times a day (BID) | ORAL | Status: AC
  Administered 2023-07-16: 50 mg via ORAL
  Filled 2023-07-16: qty 2

## 2023-07-16 MED ORDER — ACETAMINOPHEN 650 MG RE SUPP: 650.0000 mg | Freq: Four times a day (QID) | RECTAL | Status: DC | PRN

## 2023-07-16 MED ORDER — AMIODARONE HCL 200 MG PO TABS
200.0000 mg | ORAL_TABLET | Freq: Every day | ORAL | Status: DC
  Administered 2023-07-16 – 2023-07-17 (×2): 200 mg via ORAL
  Filled 2023-07-16 (×2): qty 1

## 2023-07-16 MED ORDER — PANTOPRAZOLE SODIUM 40 MG PO TBEC
40.0000 mg | DELAYED_RELEASE_TABLET | Freq: Every day | ORAL | Status: DC
  Administered 2023-07-16 – 2023-07-17 (×2): 40 mg via ORAL
  Filled 2023-07-16 (×2): qty 1

## 2023-07-16 MED ORDER — ACYCLOVIR 400 MG PO TABS
400.0000 mg | ORAL_TABLET | Freq: Two times a day (BID) | ORAL | Status: DC
  Administered 2023-07-16: 400 mg via ORAL
  Filled 2023-07-16 (×4): qty 1

## 2023-07-16 MED ORDER — SODIUM CHLORIDE 0.9% FLUSH
3.0000 mL | Freq: Two times a day (BID) | INTRAVENOUS | Status: DC
  Administered 2023-07-16 – 2023-07-17 (×2): 3 mL via INTRAVENOUS

## 2023-07-16 MED ORDER — LEVOTHYROXINE SODIUM 100 MCG PO TABS
100.0000 ug | ORAL_TABLET | Freq: Every day | ORAL | Status: DC
  Administered 2023-07-17: 100 ug via ORAL
  Filled 2023-07-16: qty 1

## 2023-07-16 NOTE — Telephone Encounter (Signed)
Dr. Annie Paras called in asking to speak to DOD about pt in ED for CP and high troponin level.   Phone number: [phone]

## 2023-07-16 NOTE — ED Notes (Signed)
EMS IV in Right hand removed d/t no longer flushing

## 2023-07-16 NOTE — ED Notes (Signed)
ED TO INPATIENT HANDOFF REPORT  ED Nurse Name and Phone #: Pollie Meyer [phone]  S Name/Age/Gender Zoe [name] 89 y.o. female Room/Bed: 039C/039C  Code Status   Code Status: Full Code  Home/SNF/Other Home Patient oriented to: self, place, time, and situation Is this baseline? Yes   Triage Complete: Triage complete  Chief Complaint Near syncope [R55]  Triage Note PT BIB [facility] for Afib/RVR.  Pt got up to go to bathroom and had a "funny feeling in chest".  Pt found to be in afib at a rate of 140-170. No NVD or SOB per EMS.  Afib dx is fairly recent. Pt is on Eliquis. Hx of CHF. Takes Lasix at home.   Pt self-converted to NS at rate of 74.  EMS gave 250 NS  Pt also recently had a urine culture taken for possible UTI.  Pt is taking pyridium.   Initial BP 106/82 122/82 96% CBG 114.    Allergies Allergies  Allergen Reactions   Quinolones Other (See Comments)    Patient on Amiodarone and can prolong QT   Penicillins Other (See Comments)    UNSPECIFIED REACTION  Patient does not remember reaction.  Has patient had a PCN reaction causing immediate rash, facial/tongue/throat swelling, SOB or lightheadedness with hypotension: no Has patient had a PCN reaction causing severe rash involving mucus membranes or skin necrosis: no Has patient had a PCN reaction that required hospitalization no Has patient had a PCN reaction occurring within the last 10 years: no If all of the above answers are "NO", then may proceed with Cephalosporin use.    Sulfa Antibiotics Other (See Comments)    UNSPECIFIED REACTION  "maybe vision issues? "    Level of Care/Admitting Diagnosis ED Disposition     ED Disposition  Admit   Condition  --   Comment  Hospital Area: [facility] [[id number]]  Level of Care: Telemetry Cardiac [103]  May place patient in observation at [facility] or [facility] if equivalent level of care is available:: No  Covid Evaluation: Asymptomatic -  no recent exposure (last 10 days) testing not required  Diagnosis: Near syncope [phone]  Admitting Physician: Clydie Braun [[id number]]  Attending Physician: Clydie Braun [[id number]]          B Medical/Surgery History Past Medical History:  Diagnosis Date   Arthritis    some - per patient   Breast cancer (HCC)    breast cancer / left    Cataract    bilat    GERD (gastroesophageal reflux disease)    History of kidney stones    Hyperlipidemia    Hypertension    Hypothyroidism    Macular degeneration    Left   S/P TAVR (transcatheter aortic valve replacement) 09/03/2018   23 mm Edwards Sapien 3 transcatheter heart valve placed via percutaneous right transfemoral approach    Severe aortic stenosis    Stress incontinence    Thyroid disease    Tinnitus    Past Surgical History:  Procedure Laterality Date   ABDOMINAL HYSTERECTOMY  1970's   BACK SURGERY     BREAST LUMPECTOMY  12/1998   lumpectomy   CARDIAC CATHETERIZATION     CARDIOVERSION N/A 04/18/2023   Procedure: CARDIOVERSION;  Surgeon: Tessa Lerner, DO;  Location: [facility] INVASIVE CV LAB;  Service: Cardiovascular;  Laterality: N/A;   CARDIOVERSION N/A 04/20/2023   Procedure: CARDIOVERSION;  Surgeon: Yates Decamp, MD;  Location: [facility] INVASIVE CV LAB;  Service: Cardiovascular;  Laterality: N/A;   EYE SURGERY     cataract surgery bilat    INTRAOPERATIVE TRANSTHORACIC ECHOCARDIOGRAM N/A 09/03/2018   Procedure: INTRAOPERATIVE TRANSTHORACIC ECHOCARDIOGRAM;  Surgeon: Kathleene Hazel, MD;  Location[facility];  Service: Open Heart Surgery;  Laterality: N/A;   KYPHOPLASTY N/A 09/07/2022   Procedure: THORACIC EIGHT KYPHOPLASTY;  Surgeon: Venita Lick, MD;  Location: [facility] OR;  Service: Orthopedics;  Laterality: N/A;  1 hr Local with [facility] 3 C-Bed   LITHOTRIPSY     Right total knee     2018 Dr. Charlann Boxer   RIGHT/LEFT HEART CATH AND CORONARY ANGIOGRAPHY N/A 08/06/2018   Procedure: RIGHT/LEFT HEART CATH AND CORONARY ANGIOGRAPHY;   Surgeon: Yates Decamp, MD;  Location: [facility] INVASIVE CV LAB;  Service: Cardiovascular;  Laterality: N/A;   THYROIDECTOMY, PARTIAL  1975   TONSILLECTOMY     as a child - patient not sure of exact date   TOTAL KNEE ARTHROPLASTY Left 03/13/2016   Procedure: TOTAL KNEE ARTHROPLASTY;  Surgeon: Durene Romans, MD;  Location: [facility] ORS;  Service: Orthopedics;  Laterality: Left;   TOTAL KNEE ARTHROPLASTY Right 06/18/2017   Procedure: RIGHT TOTAL KNEE ARTHROPLASTY;  Surgeon: Durene Romans, MD;  Location: [facility] ORS;  Service: Orthopedics;  Laterality: Right;   TRANSCATHETER AORTIC VALVE REPLACEMENT, TRANSFEMORAL N/A 09/03/2018   Procedure: TRANSCATHETER AORTIC VALVE REPLACEMENT, TRANSFEMORAL;  Surgeon: Kathleene Hazel, MD;  Location: [facility] OR;  Service: Open Heart Surgery;  Laterality: N/A;     A IV Location/Drains/Wounds Patient Lines/Drains/Airways Status     Active Line/Drains/Airways     Name Placement date Placement time Site Days   Peripheral IV 07/16/23 20 G Posterior;Right Hand 07/16/23  0740  Hand  less than 1            Intake/Output Last 24 hours  Intake/Output Summary (Last 24 hours) at 07/16/2023 2026 Last data filed at 07/16/2023 1938 Gross per 24 hour  Intake 300 ml  Output --  Net 300 ml    Labs/Imaging Results for orders placed or performed during the hospital encounter of 07/16/23 (from the past 48 hour(s))  Basic metabolic panel     Status: Abnormal   Collection Time: 07/16/23  8:12 AM  Result Value Ref Range   Sodium 138 135 - 145 mmol/L   Potassium 3.1 (L) 3.5 - 5.1 mmol/L   Chloride 102 98 - 111 mmol/L   CO2 23 22 - 32 mmol/L   Glucose, Bld 104 (H) 70 - 99 mg/dL    Comment: Glucose reference range applies only to samples taken after fasting for at least 8 hours.   BUN 23 8 - 23 mg/dL   Creatinine, Ser 5.18 0.44 - 1.00 mg/dL   Calcium 8.4 (L) 8.9 - 10.3 mg/dL   GFR, Estimated >84 >16 mL/min    Comment: (NOTE) Calculated using the CKD-EPI Creatinine Equation  (2021)    Anion gap 13 5 - 15    Comment: Performed at [facility], [address] [place], [place] [address]  Troponin I (High Sensitivity)     Status: Abnormal   Collection Time: 07/16/23  8:12 AM  Result Value Ref Range   Troponin I (High Sensitivity) 110 (HH) <18 ng/L    Comment: CRITICAL RESULT CALLED TO, READ BACK BY AND VERIFIED WITH J.NEWTON,RN 0931 07/16/23 CLARK,S (NOTE) Elevated high sensitivity troponin I (hsTnI) values and significant  changes across serial measurements may suggest ACS but many other  chronic and acute conditions are known to elevate hsTnI  results.  Refer to the "Links" section for chest pain algorithms and additional  guidance. Performed at [facility], [address] [place], [place] [address]   CBC     Status: Abnormal   Collection Time: 07/16/23  8:12 AM  Result Value Ref Range   WBC 5.2 4.0 - 10.5 K/uL   RBC 4.71 3.87 - 5.11 MIL/uL   Hemoglobin 13.6 12.0 - 15.0 g/dL   HCT 95.2 84.1 - 32.4 %   MCV 90.2 80.0 - 100.0 fL   MCH 28.9 26.0 - 34.0 pg   MCHC 32.0 30.0 - 36.0 g/dL   RDW 40.1 (H) 02.7 - 25.3 %   Platelets PLATELET CLUMPS NOTED ON SMEAR, UNABLE TO ESTIMATE 150 - 400 K/uL   nRBC 0.0 0.0 - 0.2 %    Comment: Performed at [facility], [address], [place], [place] [address]  Troponin I (High Sensitivity)     Status: Abnormal   Collection Time: 07/16/23 11:44 AM  Result Value Ref Range   Troponin I (High Sensitivity) 178 (HH) <18 ng/L    Comment: CRITICAL RESULT CALLED TO, READ BACK BY AND VERIFIED WITH J.NEWTON,RN 1303 07/16/23 CLARK,S (NOTE) Elevated high sensitivity troponin I (hsTnI) values and significant  changes across serial measurements may suggest ACS but many other  chronic and acute conditions are known to elevate hsTnI results.  Refer to the "Links" section for chest pain algorithms and additional  guidance. Performed at [facility], [address], [place], [place] [address]   Troponin I  (High Sensitivity)     Status: Abnormal   Collection Time: 07/16/23  6:40 PM  Result Value Ref Range   Troponin I (High Sensitivity) 136 (HH) <18 ng/L    Comment: CRITICAL VALUE NOTED. VALUE IS CONSISTENT WITH PREVIOUSLY REPORTED/CALLED VALUE (NOTE) Elevated high sensitivity troponin I (hsTnI) values and significant  changes across serial measurements may suggest ACS but many other  chronic and acute conditions are known to elevate hsTnI results.  Refer to the "Links" section for chest pain algorithms and additional  guidance. Performed at [facility], [address], [place], [place] [address]    DG CHEST PORT 1 VIEW  Result Date: 07/16/2023 CLINICAL DATA:  Near syncope, chest pain. EXAM: PORTABLE CHEST 1 VIEW COMPARISON:  Chest radiographs 04/15/2023. FINDINGS: No consolidation or pulmonary edema. Stable cardiac and mediastinal contours with TAVR in place. Unchanged appearance from prior vertebral augmentation in the lower thoracic spine. No pleural effusion or pneumothorax. IMPRESSION: No evidence of acute cardiopulmonary disease. Electronically Signed   By: Orvan Falconer M.D.   On: 07/16/2023 16:20    Pending Labs Unresulted Labs (From admission, onward)     Start     Ordered   07/17/23 0500  CBC  Tomorrow morning,   R        07/16/23 1434   07/17/23 0500  Basic metabolic panel  Tomorrow morning,   R        07/16/23 1434            Vitals/Pain Today's Vitals   07/16/23 1430 07/16/23 1500 07/16/23 1708 07/16/23 2009  BP: (!) 185/81 (!) 170/85  (!) 173/75  Pulse: (!) 58 (!) 58  61  Resp: (!) 22 19  20   Temp:   97.8 F (36.6 C) 98.4 F (36.9 C)  TempSrc:   Oral Oral  SpO2: 98% 99%  95%  Weight:      Height:  PainSc:        Isolation Precautions No active isolations  Medications Medications  amiodarone (PACERONE) tablet 200 mg (200 mg Oral Given 07/16/23 1014)  metoprolol tartrate (LOPRESSOR) tablet 50 mg (has no administration in time range)   levothyroxine (SYNTHROID) tablet 100 mcg (has no administration in time range)  sodium chloride flush (NS) 0.9 % injection 3 mL (3 mLs Intravenous Given 07/16/23 1834)  acetaminophen (TYLENOL) tablet 650 mg (has no administration in time range)    Or  acetaminophen (TYLENOL) suppository 650 mg (has no administration in time range)  albuterol (PROVENTIL) (2.5 MG/3ML) 0.083% nebulizer solution 2.5 mg (has no administration in time range)  cefTRIAXone (ROCEPHIN) 1 g in sodium chloride 0.9 % 100 mL IVPB (has no administration in time range)  apixaban (ELIQUIS) tablet 5 mg (has no administration in time range)  spironolactone (ALDACTONE) tablet 25 mg (25 mg Oral Given 07/16/23 1835)  acyclovir (ZOVIRAX) tablet 400 mg (has no administration in time range)  pravastatin (PRAVACHOL) tablet 40 mg (40 mg Oral Given 07/16/23 2011)  dapagliflozin propanediol (FARXIGA) tablet 10 mg (10 mg Oral Given 07/16/23 2010)  pantoprazole (PROTONIX) EC tablet 40 mg (40 mg Oral Given 07/16/23 2010)  polyethylene glycol (MIRALAX / GLYCOLAX) packet 17 g (has no administration in time range)  senna-docusate (Senokot-S) tablet 1 tablet (has no administration in time range)  cefTRIAXone (ROCEPHIN) 1 g in sodium chloride 0.9 % 100 mL IVPB (0 g Intravenous Stopped 07/16/23 1001)  metoprolol tartrate (LOPRESSOR) tablet 50 mg (50 mg Oral Given 07/16/23 1014)  apixaban (ELIQUIS) tablet 5 mg (5 mg Oral Given 07/16/23 1014)  potassium chloride SA (KLOR-CON M) CR tablet 40 mEq (40 mEq Oral Given 07/16/23 1014)  calcium gluconate 1 g/ 50 mL sodium chloride IVPB (0 mg Intravenous Stopped 07/16/23 1938)    Mobility walks with person assist     Focused Assessments Cardiac Assessment Handoff:  Cardiac Rhythm: Normal sinus rhythm Lab Results  Component Value Date   CKTOTAL 15 (L) 04/15/2023   CKMB 1.6 04/02/2011   TROPONINI <0.30 04/02/2011   Lab Results  Component Value Date   DDIMER <0.27 02/17/2023   Does the Patient  currently have chest pain? No   , Pulmonary Assessment Handoff:  Lung sounds:   O2 Device: Room Air      R Recommendations: See Admitting Provider Note  Report given to:   Additional Notes: Was A-fib, RVR, self converted. No Cp at this time. Resting comfortably.

## 2023-07-16 NOTE — Telephone Encounter (Signed)
LM for Lora, pt's daughter, with positive urine culture.  Pt is currently in the ED at [facility]. Advised her to call our office if pt is not prescribed an antibiotic by the ED Dr.

## 2023-07-16 NOTE — Telephone Encounter (Signed)
Dr. Mayford Knife returned a call back to Dr. Annie Paras in regards to this message and was addressed.  Will close this encounter.

## 2023-07-16 NOTE — Consult Note (Addendum)
Cardiology Consultation   Patient ID: Zoe [name] MRN: [id number]; DOB: 04-Feb-1934  Admit date: 07/16/2023 Date of Consult: 07/16/2023  PCP:  Irena Reichmann, DO   [facility] Providers Cardiologist:  Yates Decamp, MD   { \ Patient Profile:   Zoe [name] is a 89 y.o. female with a hx of paroxysmal atrial fibrillation, chronic HFpEF, severe AS status post TAVR by Dr. Cornelius Moras 2019, asymptomatic bilateral carotid artery stenosis, multiple myeloma, hypertension, hyperlipidemia who is being seen 07/16/2023 for the evaluation of atrial fibrillation, chest pain, elevated troponins at the request of Dr. Katrinka Blazing.  History of Present Illness:   Zoe [name] is followed by Dr. Jacinto Halim for her atrial fibrillation that was diagnosed in April 2024.  She has had 2 similar recent hospitalizations in the past 6 months for her atrial fibrillation and chronic HFpEF.  She does have history of an admission in June 2024 in which she underwent 2 cardioversions.  The first cardioversion was by EMS and had converted back to normal sinus rhythm but then went back and into atrial fibrillation.  She was started on IV diltiazem and IV amiodarone.  Cardioversion was attempted 2 days later and again returned back to sinus rhythm briefly but then went back into A-fib RVR.  Eventually she was discharged on digoxin, metoprolol, diltiazem, amiodarone.  She was last seen in office in July 2024 for follow-up.  At the time she had complained of sharp pain in the left side of her chest felt related to more musculoskeletal versus cardiac.  He had discontinued her digoxin and continue with her amiodarone and metoprolol.  She also takes Lasix 40 mg as needed.  She rarely uses this.  Currently patient is being evaluated for chest pain and atrial fibrillation RVR.  She states that she initially woke up this morning and started experiencing nonexertional, nonradiating chest pain associated with palpitations.  She denied any  other associated symptoms.  Per EMS, she was found to be in atrial fibrillation with heart rates in the 140s to 170s and then had spontaneously converted back to normal sinus rhythm and route to the emergency room.  Further evaluation also reveals that she has a UTI with cultures positive for Klebsiella.  Currently patient has no complaints and has been asked to stay in the emergency room because she lives at home with her grandson he does not take close care of her and wanted to stay here to make sure that somebody was looking after her.  She is hypertensive here with systolics reaching as high as 190s.  Maintaining normal sinus rhythm.  She reports no significant shortness of breath or peripheral edema.  Rarely uses her Lasix.  Her chest pain is infrequent and seems to be related to her episodes of atrial fibrillation lasting only a few minutes.  EKG with no acute ST-T wave changes.  Troponin 110-178.  Normal TSH.  Potassium 3.1.  Last echocardiogram in June showed normal EF 60 to 65% with moderate concentric LVH.  Grade 2 diastolic dysfunction.  Normal RV function.   Past Medical History:  Diagnosis Date   Arthritis    some - per patient   Breast cancer (HCC)    breast cancer / left    Cataract    bilat    GERD (gastroesophageal reflux disease)    History of kidney stones    Hyperlipidemia    Hypertension    Hypothyroidism    Macular degeneration    Left  S/P TAVR (transcatheter aortic valve replacement) 09/03/2018   23 mm Edwards Sapien 3 transcatheter heart valve placed via percutaneous right transfemoral approach    Severe aortic stenosis    Stress incontinence    Thyroid disease    Tinnitus     Past Surgical History:  Procedure Laterality Date   ABDOMINAL HYSTERECTOMY  1970's   BACK SURGERY     BREAST LUMPECTOMY  12/1998   lumpectomy   CARDIAC CATHETERIZATION     CARDIOVERSION N/A 04/18/2023   Procedure: CARDIOVERSION;  Surgeon: Tessa Lerner, DO;  Location: [facility] INVASIVE CV  LAB;  Service: Cardiovascular;  Laterality: N/A;   CARDIOVERSION N/A 04/20/2023   Procedure: CARDIOVERSION;  Surgeon: Yates Decamp, MD;  Location: [facility] INVASIVE CV LAB;  Service: Cardiovascular;  Laterality: N/A;   EYE SURGERY     cataract surgery bilat    INTRAOPERATIVE TRANSTHORACIC ECHOCARDIOGRAM N/A 09/03/2018   Procedure: INTRAOPERATIVE TRANSTHORACIC ECHOCARDIOGRAM;  Surgeon: Kathleene Hazel, MD;  Location: [facility] OR;  Service: Open Heart Surgery;  Laterality: N/A;   KYPHOPLASTY N/A 09/07/2022   Procedure: THORACIC EIGHT KYPHOPLASTY;  Surgeon: Venita Lick, MD;  Location: [facility] OR;  Service: Orthopedics;  Laterality: N/A;  1 hr Local with [facility] 3 C-Bed   LITHOTRIPSY     Right total knee     2018 Dr. Charlann Boxer   RIGHT/LEFT HEART CATH AND CORONARY ANGIOGRAPHY N/A 08/06/2018   Procedure: RIGHT/LEFT HEART CATH AND CORONARY ANGIOGRAPHY;  Surgeon: Yates Decamp, MD;  Location: [facility] INVASIVE CV LAB;  Service: Cardiovascular;  Laterality: N/A;   THYROIDECTOMY, PARTIAL  1975   TONSILLECTOMY     as a child - patient not sure of exact date   TOTAL KNEE ARTHROPLASTY Left 03/13/2016   Procedure: TOTAL KNEE ARTHROPLASTY;  Surgeon: Durene Romans, MD;  Location: [facility] ORS;  Service: Orthopedics;  Laterality: Left;   TOTAL KNEE ARTHROPLASTY Right 06/18/2017   Procedure: RIGHT TOTAL KNEE ARTHROPLASTY;  Surgeon: Durene Romans, MD;  Location: [facility] ORS;  Service: Orthopedics;  Laterality: Right;   TRANSCATHETER AORTIC VALVE REPLACEMENT, TRANSFEMORAL N/A 09/03/2018   Procedure: TRANSCATHETER AORTIC VALVE REPLACEMENT, TRANSFEMORAL;  Surgeon: Kathleene Hazel, MD;  Location: [facility] OR;  Service: Open Heart Surgery;  Laterality: N/A;    Inpatient Medications: Scheduled Meds:  amiodarone  200 mg Oral Daily   apixaban  5 mg Oral BID   [START ON 07/17/2023] levothyroxine  100 mcg Oral Q0600   metoprolol tartrate  50 mg Oral BID   sodium chloride flush  3 mL Intravenous Q12H   Continuous Infusions:  calcium  gluconate     [START ON 07/17/2023] cefTRIAXone (ROCEPHIN)  IV     PRN Meds: acetaminophen **OR** acetaminophen, albuterol  Allergies:    Allergies  Allergen Reactions   Quinolones Other (See Comments)    Patient on Amiodarone and can prolong QT   Penicillins Other (See Comments)    UNSPECIFIED REACTION  Patient does not remember reaction.  Has patient had a PCN reaction causing immediate rash, facial/tongue/throat swelling, SOB or lightheadedness with hypotension: no Has patient had a PCN reaction causing severe rash involving mucus membranes or skin necrosis: no Has patient had a PCN reaction that required hospitalization no Has patient had a PCN reaction occurring within the last 10 years: no If all of the above answers are "NO", then may proceed with Cephalosporin use.    Sulfa Antibiotics Other (See Comments)    UNSPECIFIED REACTION  "maybe vision issues? 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  Tobacco Use   Smoking status: Never   Smokeless tobacco: Never  Vaping Use   Vaping status: Never Used  Substance and Sexual Activity   Alcohol use: No   Drug use: No   Sexual activity: Not Currently  Other Topics Concern   Not on file  Social History Narrative   Not on file   Social Determinants of Health   Financial Resource Strain: Low Risk  (11/12/2018)   Overall Financial Resource Strain (CARDIA)    Difficulty of Paying Living Expenses: Not very hard  Food Insecurity: No Food Insecurity (07/16/2023)   Hunger Vital Sign    Worried About Running Out of Food in the Last Year: Never true    Ran Out of Food in the Last Year: Never true  Transportation Needs: No  Transportation Needs (07/16/2023)   PRAPARE - Administrator, Civil Service (Medical): No    Lack of Transportation (Non-Medical): No  Physical Activity: Inactive (11/12/2018)   Exercise Vital Sign    Days of Exercise per Week: 0 days    Minutes of Exercise per Session: 0 min  Stress: Stress Concern Present (11/12/2018)   [employer] of Occupational Health - Occupational Stress Questionnaire    Feeling of Stress : To some extent  Social Connections: Not on file  Intimate Partner Violence: Not At Risk (07/16/2023)   Humiliation, Afraid, Rape, and Kick questionnaire    Fear of Current or Ex-Partner: No    Emotionally Abused: No    Physically Abused: No    Sexually Abused: No    Family History:   Family History  Problem Relation Age of Onset   Diabetes Mother    Stroke Mother        Carotid artery disease   Heart disease Father        CAD   Coronary artery disease Father    Diabetes Sister      ROS:  Please see the history of present illness.  All other ROS reviewed and negative.     Physical Exam/Data:   Vitals:   07/16/23 1300 07/16/23 1400 07/16/23 1430 07/16/23 1500  BP: (!) 191/72 (!) 175/64 (!) 185/81 (!) 170/85  Pulse: (!) 55 (!) 55 (!) 58 (!) 58  Resp: 14 (!) 22 (!) 22 19  Temp:      TempSrc:      SpO2: 98% 96% 98% 99%  Weight:      Height:        Intake/Output Summary (Last 24 hours) at 07/16/2023 1646 Last data filed at 07/16/2023 0740 Gross per 24 hour  Intake 250 ml  Output --  Net 250 ml      07/16/2023    8:13 AM 07/13/2023   10:08 AM 06/29/2023    9:48 AM  Last 3 Weights  Weight (lbs) 148 lb 152 lb 3.2 oz 151 lb  Weight (kg) 67.132 kg 69.037 kg 68.493 kg     Body mass index is 26.22 kg/m.  General:  Well nourished, well developed, in no acute distress HEENT: normal Neck: no JVD Vascular: No carotid bruits; Distal pulses 2+ bilaterally Cardiac:  normal S1,  S2; RRR; no murmur  Lungs:  clear to auscultation bilaterally, no  wheezing, rhonchi or rales  Abd: soft, nontender, no hepatomegaly  Ext: mild edema Musculoskeletal:  No deformities, BUE and BLE strength normal and equal Skin: warm and dry  Neuro:  CNs 2-12 intact, no focal abnormalities noted Psych:  Normal affect   EKG:  The EKG was personally reviewed and demonstrates: Normal sinus rhythm, heart rate 91.  No acute ST-T wave changes.  Borderline QTc prolongation 495. Telemetry:  Telemetry was personally reviewed and demonstrates: Normal sinus rhythm heart rates in the 60s  Relevant CV Studies: Echocardiogram 04/16/2023 1. Left ventricular ejection fraction, by estimation, is 60 to 65%. The  left ventricle has normal function. The left ventricle has no regional  wall motion abnormalities. There is moderate concentric left ventricular  hypertrophy. Left ventricular  diastolic parameters are consistent with Grade II diastolic dysfunction  (pseudonormalization). Elevated left ventricular end-diastolic pressure.   2. Right ventricular systolic function is normal. The right ventricular  size is normal. There is mildly elevated pulmonary artery systolic  pressure. The estimated right ventricular systolic pressure is 41.4 mmHg.   3. Left atrial size was moderately dilated.   4. The mitral valve is normal in structure. Mild mitral valve  regurgitation.   5. The aortic valve has been repaired/replaced. Aortic valve  regurgitation is mild. No aortic stenosis is present. There is a 23 mm 3  THV size 23 mm Sapien prosthetic (TAVR) valve present in the aortic  position. Procedure Date: 09/03/2018.   6. The inferior vena cava is normal in size with <50% respiratory  variability, suggesting right atrial pressure of 8 mmHg.   Comparison(s): A prior study was performed on 02/17/2023. No significant  change from prior study.   Laboratory Data:  High Sensitivity Troponin:   Recent Labs  Lab 07/16/23 0812 07/16/23 1144  TROPONINIHS 110* 178*      Chemistry Recent Labs  Lab 07/13/23 0926 07/16/23 0812  NA 134* 138  K 3.9 3.1*  CL 99 102  CO2 27 23  GLUCOSE 103* 104*  BUN 23 23  CREATININE 0.95 0.84  CALCIUM 8.8* 8.4*  GFRNONAA 58* >60  ANIONGAP 8 13    Recent Labs  Lab 07/13/23 0926  PROT 5.7*  ALBUMIN 3.8  AST 13*  ALT 11  ALKPHOS 66  BILITOT 0.7   Lipids No results for input(s): "CHOL", "TRIG", "HDL", "LABVLDL", "LDLCALC", "CHOLHDL" in the last 168 hours.  Hematology Recent Labs  Lab 07/13/23 0926 07/16/23 0812  WBC 5.6 5.2  RBC 4.48 4.71  HGB 13.5 13.6  HCT 39.4 42.5  MCV 87.9 90.2  MCH 30.1 28.9  MCHC 34.3 32.0  RDW 18.7* 18.7*  PLT 108* PLATELET CLUMPS NOTED ON SMEAR, UNABLE TO ESTIMATE   Thyroid No results for input(s): "TSH", "FREET4" in the last 168 hours.  BNPNo results for input(s): "BNP", "PROBNP" in the last 168 hours.  DDimer No results for input(s): "DDIMER" in the last 168 hours.   Radiology/Studies:  DG CHEST PORT 1 VIEW  Result Date: 07/16/2023 CLINICAL DATA:  Near syncope, chest pain. EXAM: PORTABLE CHEST 1 VIEW COMPARISON:  Chest radiographs 04/15/2023. FINDINGS: No consolidation or pulmonary edema. Stable cardiac and mediastinal contours with TAVR in place. Unchanged appearance from prior vertebral augmentation in the lower thoracic spine. No pleural effusion or pneumothorax. IMPRESSION: No evidence of acute cardiopulmonary disease. Electronically Signed   By: Orvan Falconer M.D.   On: 07/16/2023 16:20  Assessment and Plan:   Paroxysmal atrial fibrillation Chest pain with elevated troponins Most recent admission in June 2024 in which she had 2 unsuccessful cardioversions that failed to maintain normal sinus rhythm.  She presents today with an episode of chest pain that seems highly correlated to her atrial fibrillation.  En route to the ED she had spontaneously converted back to normal sinus rhythm with resolution of symptoms. Had rapid rates as high as 170s.  EKG without any  acute ST-T wave changes.  Troponins 110-178.  Similar but lower than prior readings based off her other admission for A-fib RVR in June.  Currently maintaining normal sinus rhythm.  In regards to her chest pain.  Likely related to RVR, elevated BP, UTI.  Previously felt to be musculoskeletal.  Suspect that her elevated troponins are due to demand ischemia.  Can consider outpatient evaluation if this persists while she is in normal sinus rhythm.  She also has previous catheterization in 2019 that showed normal coronary arteries. Continue p.o. amiodarone 200 mg daily, Eliquis 5 mg twice daily.  Weight currently 67 kg.  Need to monitor for reduced dosing as she does have 1 indication for age.  Continue Lopressor 50 mg twice daily. TSH normal.  Chronic HFpEF Overall euvolemic with mild peripheral edema.  Rarely takes Lasix. Currently with a UTI.  Technically can continue Farxiga and treat through UTI.  If she has recurrences would need to consider discontinuing.  Defer to MD whether to continue currently. Will start her on spironolactone 25 mg for further titration of GDMT, help with potassium, and some gentle diureses.   Severe AS status post TAVR Aortic valve regurgitation is mild. No aortic stenosis is present. There is a 23 mm 3 THV size 23 mm Sapien prosthetic (TAVR) valve present in the aortic  position. Stable based off previous echo.   Hypertension As high as 190s here.  Adding on spironolactone.    Risk Assessment/Risk Scores:   New York Heart Association (NYHA) Functional Class NYHA Class II  CHA2DS2-VASc Score = 5  How you figure that out this indicates a 7.2% annual risk of stroke. The patient's score is based upon: CHF History: 1 HTN History: 1 Diabetes History: 0 Stroke History: 0 Vascular Disease History: 0 Age Score: 2 Gender Score: 1   For questions or updates, please contact [facility] Please consult [email or web address] for contact info under     Signed, Abagail Kitchens, PA-C  07/16/2023 4:46 PM   ATTENDING ATTESTATION:  After conducting a review of all available clinical information with the care team, interviewing the patient, and performing a physical exam, I agree with the findings and plan described in this note.   GEN: No acute distress.   HEENT:  MMM, JVD ~8cm, no scleral icterus Cardiac: RRR, no murmurs, rubs, or gallops.  Respiratory: Mildly decreased BS at bases GI: Soft, nontender, non-distended  MS: +1 edema; No deformity. Neuro:  Nonfocal  Vasc:  +2 radial pulses  The patient is a very pleasant 89 year old female with a history of paroxysmal atrial fibrillation on Eliquis, chronic diastolic heart failure, aortic stenosis status post TAVR in 2019 with preprocedure coronary angiography demonstrating no obstructive coronary artery disease, carotid stenosis, multiple myeloma, hypertension, and hyperlipidemia who presents today with failure to thrive.  The patient was in her normal state of health up until this morning when she felt off.  She did feel palpitations.  She felt a chest discomfort.  She called the EMS.  On  arrival the patient was noted to be in atrial fibrillation with a rapid ventricular rate.  On route to the emergency department she converted to normal sinus rhythm.  Her initial blood pressures here were normal however her blood pressures thereafter were quite elevated.  Her troponins are mildly elevated x 2 today.  Her EKG demonstrates sinus rhythm without acute ischemic changes.  The patient developed elevated troponins consistent with acute coronary syndrome during her last admission in June.  Her troponins were actually higher than they are now.  I believe her troponin represents a type II myocardial infarction.  I do not see a compelling indication for coronary angiography at this time.  Additionally she was found to have a urinary tract infection which is being treated by hospital medicine.  We will have to  discontinue her Marcelline Deist given her urinary tract infection.  We will start spironolactone which can help with chronic diastolic heart failure.  Continue beta-blocker for now.  Continue apixaban as I do not see an indication at this time for any invasive procedures.  Alverda Skeans, MD Pager [phone]

## 2023-07-16 NOTE — ED Triage Notes (Signed)
PT BIB [facility] for Afib/RVR.  Pt got up to go to bathroom and had a "funny feeling in chest".  Pt found to be in afib at a rate of 140-170. No NVD or SOB per EMS.  Afib dx is fairly recent. Pt is on Eliquis. Hx of CHF. Takes Lasix at home.   Pt self-converted to NS at rate of 74.  EMS gave 250 NS  Pt also recently had a urine culture taken for possible UTI.  Pt is taking pyridium.   Initial BP 106/82 122/82 96% CBG 114.

## 2023-07-16 NOTE — ED Notes (Signed)
Lab called to report delta trop of 178.

## 2023-07-16 NOTE — H&P (Addendum)
History and Physical    Patient: Zoe [name] ZOX:[id number] DOB: June 28, 1934 DOA: 07/16/2023 DOS: the patient was seen and examined on 07/16/2023 PCP: Irena Reichmann, DO  Patient coming from: Home  Chief Complaint: Chest pain  HPI: Zoe [name] is a 89 y.o. female with medical history significant of hypertension, hyperlipidemia, paroxysmal atrial fibrillation, aortic stenosis s/p TAVR, multiple myeloma, hypothyroidism, and GERD who presents with complaints of chest pain.  She reported waking up around 5:10 AM as her nursing aide was leaving.  She ate some wheat thins and raisins and laid back down.  Thereafter reported having nonradiating chest pain which she rated 5/10 underneath her left breast.  She question if symptoms were secondary to indigestion, but after waiting 30 to 45 minutes the symptoms persisted for which she became more concerned.  She makes note that she has intermittently had the symptoms past  Reported having associated symptoms  lightheadedness.  Patient reported that she also had been recently having dysuria and polyuria.   She had been taking Pyridium at home.  Urine culture from 9/20 grew out greater than 100,000 colonies/mL of Klebsiella pneumonia.  En route with EMS patient was noted to be in A-fib with heart rates into the 170s patient self burden into normal sinus rhythm EMS had given her 250 mL of IV fluids.  In the emergency department patient was noted to be afebrile pulse 55-78, blood pressures elevated up to 199/74, and O2 saturations currently maintained on room air.  Labs significant for potassium 3.1, calcium 8.4, and high-sensitivity troponin 110->178.  Patient had been given 40 mEq of potassium chloride and Rocephin 1 g IV.  Review of Systems: As mentioned in the history of present illness. All other systems reviewed and are negative. Past Medical History:  Diagnosis Date   Arthritis    some - per patient   Breast cancer (HCC)    breast cancer /  left    Cataract    bilat    GERD (gastroesophageal reflux disease)    History of kidney stones    Hyperlipidemia    Hypertension    Hypothyroidism    Macular degeneration    Left   S/P TAVR (transcatheter aortic valve replacement) 09/03/2018   23 mm Edwards Sapien 3 transcatheter heart valve placed via percutaneous right transfemoral approach    Severe aortic stenosis    Stress incontinence    Thyroid disease    Tinnitus    Past Surgical History:  Procedure Laterality Date   ABDOMINAL HYSTERECTOMY  1970's   BACK SURGERY     BREAST LUMPECTOMY  12/1998   lumpectomy   CARDIAC CATHETERIZATION     CARDIOVERSION N/A 04/18/2023   Procedure: CARDIOVERSION;  Surgeon: Tessa Lerner, DO;  Location: [facility] INVASIVE CV LAB;  Service: Cardiovascular;  Laterality: N/A;   CARDIOVERSION N/A 04/20/2023   Procedure: CARDIOVERSION;  Surgeon: Yates Decamp, MD;  Location: [facility] INVASIVE CV LAB;  Service: Cardiovascular;  Laterality: N/A;   EYE SURGERY     cataract surgery bilat    INTRAOPERATIVE TRANSTHORACIC ECHOCARDIOGRAM N/A 09/03/2018   Procedure: INTRAOPERATIVE TRANSTHORACIC ECHOCARDIOGRAM;  Surgeon: Kathleene Hazel, MD;  Location: [facility] OR;  Service: Open Heart Surgery;  Laterality: N/A;   KYPHOPLASTY N/A 09/07/2022   Procedure: THORACIC EIGHT KYPHOPLASTY;  Surgeon: Venita Lick, MD;  Location: [facility] OR;  Service: Orthopedics;  Laterality: N/A;  1 hr Local with [facility] 3 C-Bed   LITHOTRIPSY     Right total knee  2018 Dr. Charlann Boxer   RIGHT/LEFT HEART CATH AND CORONARY ANGIOGRAPHY N/A 08/06/2018   Procedure: RIGHT/LEFT HEART CATH AND CORONARY ANGIOGRAPHY;  Surgeon: Yates Decamp, MD;  Location: [facility] INVASIVE CV LAB;  Service: Cardiovascular;  Laterality: N/A;   THYROIDECTOMY, PARTIAL  1975   TONSILLECTOMY     as a child - patient not sure of exact date   TOTAL KNEE ARTHROPLASTY Left 03/13/2016   Procedure: TOTAL KNEE ARTHROPLASTY;  Surgeon: Durene Romans, MD;  Location: [facility] ORS;  Service: Orthopedics;   Laterality: Left;   TOTAL KNEE ARTHROPLASTY Right 06/18/2017   Procedure: RIGHT TOTAL KNEE ARTHROPLASTY;  Surgeon: Durene Romans, MD;  Location: [facility] ORS;  Service: Orthopedics;  Laterality: Right;   TRANSCATHETER AORTIC VALVE REPLACEMENT, TRANSFEMORAL N/A 09/03/2018   Procedure: TRANSCATHETER AORTIC VALVE REPLACEMENT, TRANSFEMORAL;  Surgeon: Kathleene Hazel, MD;  Location: [facility] OR;  Service: Open Heart Surgery;  Laterality: N/A;   Social History:  reports that she has never smoked. She has never used smokeless tobacco. She reports that she does not drink alcohol and does not use drugs.  Allergies  Allergen Reactions   Quinolones Other (See Comments)    Patient on Amiodarone and can prolong QT   Penicillins Other (See Comments)    UNSPECIFIED REACTION  Patient does not remember reaction.  Has patient had a PCN reaction causing immediate rash, facial/tongue/throat swelling, SOB or lightheadedness with hypotension: no Has patient had a PCN reaction causing severe rash involving mucus membranes or skin necrosis: no Has patient had a PCN reaction that required hospitalization no Has patient had a PCN reaction occurring within the last 10 years: no If all of the above answers are "NO", then may proceed with Cephalosporin use.    Sulfa Antibiotics Other (See Comments)    UNSPECIFIED REACTION  "maybe vision issues? "    Family History  Problem Relation Age of Onset   Diabetes Mother    Stroke Mother        Carotid artery disease   Heart disease Father        CAD   Coronary artery disease Father    Diabetes Sister     Prior to Admission medications   Medication Sig Start Date End Date Taking? Authorizing Provider  acetaminophen (TYLENOL) 500 MG tablet Take 500 mg by mouth every 6 (six) hours as needed for moderate pain.    [provider]  acyclovir (ZOVIRAX) 400 MG tablet Take 1 tablet (400 mg total) by mouth 2 (two) times daily. 03/16/23   Jaci Standard, MD   albuterol (VENTOLIN HFA) 108 (90 Base) MCG/ACT inhaler Inhale 2 puffs into the lungs every 6 (six) hours as needed for wheezing or shortness of breath. Patient not taking: Reported on 05/17/2023 05/10/23   Luciano Cutter, MD  amiodarone (PACERONE) 200 MG tablet Take 1 tablet (200 mg total) by mouth daily. 04/20/23   Yates Decamp, MD  apixaban (ELIQUIS) 5 MG TABS tablet Take 1 tablet (5 mg total) by mouth 2 (two) times daily. 03/01/23 08/28/23  Yates Decamp, MD  Artificial Tear Solution (SOOTHE XP) SOLN Place 1 drop into both eyes every evening.    [provider]  Cholecalciferol (VITAMIN D3) 50 MCG (2000 UT) capsule Take 1 capsule (2,000 Units total) by mouth daily. 09/11/22   Jaci Standard, MD  [employer] VITAMIN B12 1000 MCG tablet TAKE 1 TABLET BY MOUTH EVERY DAY 02/15/23   [name] T, PA-C  dapagliflozin propanediol (FARXIGA) 10 MG TABS tablet  TAKE 1 TABLET BY MOUTH EVERY DAY 10/03/22   Yates Decamp, MD  dexamethasone (DECADRON) 4 MG tablet Take 5 tablets (20 mg total) by mouth every 14 (fourteen) days. Take in the morning with food on treatment days 06/01/23   Jaci Standard, MD  esomeprazole (NEXIUM) 20 MG capsule Take 20 mg by mouth daily as needed (Heartburn). 12/06/21   [provider]  lenalidomide (REVLIMID) 25 MG capsule Take 1 capsule (25 mg total) by mouth daily. Siri Cole #[id number]  Date Obtained 06/28/23 Take one capsule daily for 21 days none for 7 06/28/23   Jaci Standard, MD  levothyroxine (SYNTHROID, LEVOTHROID) 100 MCG tablet Take 100 mcg by mouth daily before breakfast. 12/29/15   [provider]  metoprolol tartrate (LOPRESSOR) 50 MG tablet TAKE 1 TABLET BY MOUTH TWICE A DAY 07/10/23   Yates Decamp, MD  Multiple Vitamins-Minerals (PRESERVISION AREDS) CAPS Take 1 capsule by mouth 2 (two) times daily.    [provider]  OVER THE COUNTER MEDICATION Take 1 capsule by mouth in the morning and at bedtime. AREDS    [provider]   phenazopyridine (PYRIDIUM) 100 MG tablet Take 1 tablet (100 mg total) by mouth 3 (three) times daily as needed for pain. 07/13/23   [name] T, PA-C  polyethylene glycol (MIRALAX / GLYCOLAX) 17 g packet Take 17 g by mouth daily as needed for mild constipation.    [provider]  pravastatin (PRAVACHOL) 40 MG tablet Take 40 mg by mouth every evening.    [provider]  sennosides-docusate sodium (SENOKOT-S) 8.6-50 MG tablet Take 1 tablet by mouth daily as needed for constipation. Patient not taking: Reported on 06/29/2023    [provider]    Physical Exam: Vitals:   07/16/23 1300 07/16/23 1400 07/16/23 1430 07/16/23 1500  BP: (!) 191/72 (!) 175/64 (!) 185/81 (!) 170/85  Pulse: (!) 55 (!) 55 (!) 58 (!) 58  Resp: 14 (!) 22 (!) 22 19  Temp:      TempSrc:      SpO2: 98% 96% 98% 99%  Weight:      Height:       Constitutional: Elderly female who appears Eyes: PERRL, lids and conjunctivae normal ENMT: Mucous membranes are moist. Posterior pharynx clear of any exudate or lesions.  Neck: normal, supple,  Respiratory: clear to auscultation bilaterally, no wheezing, no crackles. Normal respiratory effort. No accessory muscle use.  Cardiovascular: Regular rate and rhythm, no murmurs / rubs / gallops.  Mild lower extremity edema. Abdomen: no tenderness, no masses palpated. No hepatosplenomegaly. Bowel sounds positive.  Musculoskeletal: no clubbing / cyanosis. No joint deformity upper and lower extremities. Good ROM, no contractures. Normal muscle tone.  Skin: no rashes, lesions, ulcers. No induration Neurologic: CN 2-12 grossly intact. Sensation intact, DTR normal. Strength 5/5 in all 4.  Psychiatric: Normal judgment and insight. Alert and oriented x 3. Normal mood.   Data Reviewed:  EKG reveals sinus rhythm at 77 bpm with borderline ST depressions in diffuse leads.  Reviewed labs, and pertinent records as documented.  Assessment and Plan:   Chest  pain Elevated troponin Patient reported having chest pain underneath her left breast this morning.  High-sensitivity troponins 110->178.  EKG did not note any acute ST wave changes.  Elevated troponin thought secondary to demand in setting of atrial fibrillation.  Last EF noted to be 60 to 65% with grade 2 diastolic dysfunction with left atrium moderately dilated, and aortic valve  replaced with mild regurgitation. -Admit to a cardiac telemetry bed -Trend cardiac troponin -Continue beta-blocker -Cardiology consulted,  will follow-up for any further recommendations  Paroxysmal atrial fibrillation on chronic anticoagulation Patient was reported to be in atrial fibrillation prior to arrival with heart rates elevated into the 170s, but converted back to a sinus rhythm. -Goal potassium at least 4 and magnesium at least 2 -Continue metoprolol  Hypertensive urgency Blood pressures initially elevated up to 199/74.    -Continue metoprolol and spironolactone -Hydralazine IV as needed blood pressure  Klebsiella urinary tract infection Prior to arrival.  Urinalysis from 9/20 was significant for Klebsiella pneumonia.  Patient had been given Rocephin 1 g IV. -Continue Rocephin  Heart failure with preserved EF Chronic.  Patient appears to be relatively euvolemic.  Echocardiogram indemnity to be 60 to 65% with grade 2 diastolic dysfunction.  Cardiology evaluated patient and started her on spironolactone 25 mg daily for goal-directed medical therapy. -Strict I&Os and daily weights -Continue Farxiga, but consider discontinuing if patient develops recurrent UTIs -Continue spironolactone  Hypokalemia Hypocalcemia Potassium was noted to be 3.1 and calcium 8.4.  Patient had been given 40 mEq of potassium chloride p.o. -Calcium gluconate 1 g IV -Continue to monitor and replace as needed  Severe aortic stenosis s/p TAVR Patient with recent echocardiogram that noted mild aortic valve regurgitation with  TAVR, and was otherwise noted to be stable.  Hyperlipidemia LDL last noted on 03/30/2023. -Continue statin  Multiple myeloma not having achieved remission Patient followed by Dr. Leonides Schanz of oncology in outpatient setting and had completed her 14 days of Revlimid this month -Continue outpatient follow-up with nephrology  Hypothyroidism TSH 0.612 on 6/23 -Continue levothyroxine  GERD -Continue pharmacy substitution of Protonix  DVT prophylaxis:Eliquis Advance Care Planning:   Code Status: Full Code  Consults: Cardiology  Family Communication:   Severity of Illness: The appropriate patient status for this patient is OBSERVATION. Observation status is judged to be reasonable and necessary in order to provide the required intensity of service to ensure the patient's safety. The patient's presenting symptoms, physical exam findings, and initial radiographic and laboratory data in the context of their medical condition is felt to place them at decreased risk for further clinical deterioration. Furthermore, it is anticipated that the patient will be medically stable for discharge from the hospital within 2 midnights of admission.   Author: Clydie Braun, MD 07/16/2023 3:13 PM  For on call review [email or web address].

## 2023-07-16 NOTE — ED Provider Notes (Signed)
[place] EMERGENCY DEPARTMENT AT [facility] Provider Note   CSN: [id number] Arrival date & time: 07/16/23  6578     History Chief Complaint: Chest pain and Light-headedness   Zoe [name] is a 89 y.o. female with PMH of paroxysmal A-Fib (CHA2DS-VASc = 5), HFpEF, HTN, HLD, aortic stenosis s/p TAVR, GERD, Multiple Myeloma, and hypothyroidism who presented to the ED via EMS with chest plain and light-headedness. Patient states she woke up around 5:00 am this morning, had a snack and soon after started experiencing 5/10 non-radiating chest pain with light-headedness and diaphoresis. Denies n/v, SOB, or LOC. Patient recently diagnosed with A-Fib 01/2023 and states symptoms were very similar to those she experienced prior to being diagnosed. Per EMS, patient was in A-Fib with RVR (rates 140-17) but spontaneously converted in route to ED. Compliant with all medications. Patient endorses dysuria/polyuria for the past week. Denies hematuria. U/A from 9/20 indicative of UTI with culture resulting yesterday growing Klebsiella pneum. Denies recurrent UTI's.    Home Medications Prior to Admission medications   Medication Sig Start Date End Date Taking? Authorizing Provider  acetaminophen (TYLENOL) 500 MG tablet Take 500 mg by mouth every 6 (six) hours as needed for moderate pain.    [provider]  acyclovir (ZOVIRAX) 400 MG tablet Take 1 tablet (400 mg total) by mouth 2 (two) times daily. 03/16/23   Jaci Standard, MD  albuterol (VENTOLIN HFA) 108 (90 Base) MCG/ACT inhaler Inhale 2 puffs into the lungs every 6 (six) hours as needed for wheezing or shortness of breath. Patient not taking: Reported on 05/17/2023 05/10/23   Luciano Cutter, MD  amiodarone (PACERONE) 200 MG tablet Take 1 tablet (200 mg total) by mouth daily. 04/20/23   Yates Decamp, MD  apixaban (ELIQUIS) 5 MG TABS tablet Take 1 tablet (5 mg total) by mouth 2 (two) times daily. 03/01/23 08/28/23  Yates Decamp, MD   Artificial Tear Solution (SOOTHE XP) SOLN Place 1 drop into both eyes every evening.    [provider]  Cholecalciferol (VITAMIN D3) 50 MCG (2000 UT) capsule Take 1 capsule (2,000 Units total) by mouth daily. 09/11/22   Jaci Standard, MD  [employer] VITAMIN B12 1000 MCG tablet TAKE 1 TABLET BY MOUTH EVERY DAY 02/15/23   [name] T, PA-C  dapagliflozin propanediol (FARXIGA) 10 MG TABS tablet TAKE 1 TABLET BY MOUTH EVERY DAY 10/03/22   Yates Decamp, MD  dexamethasone (DECADRON) 4 MG tablet Take 5 tablets (20 mg total) by mouth every 14 (fourteen) days. Take in the morning with food on treatment days 06/01/23   Jaci Standard, MD  esomeprazole (NEXIUM) 20 MG capsule Take 20 mg by mouth daily as needed (Heartburn). 12/06/21   [provider]  lenalidomide (REVLIMID) 25 MG capsule Take 1 capsule (25 mg total) by mouth daily. Siri Cole #[id number]  Date Obtained 06/28/23 Take one capsule daily for 21 days none for 7 06/28/23   Jaci Standard, MD  levothyroxine (SYNTHROID, LEVOTHROID) 100 MCG tablet Take 100 mcg by mouth daily before breakfast. 12/29/15   [provider]  metoprolol tartrate (LOPRESSOR) 50 MG tablet TAKE 1 TABLET BY MOUTH TWICE A DAY 07/10/23   Yates Decamp, MD  Multiple Vitamins-Minerals (PRESERVISION AREDS) CAPS Take 1 capsule by mouth 2 (two) times daily.    [provider]  OVER THE COUNTER MEDICATION Take 1 capsule by mouth in the morning and at bedtime. AREDS    [provider]  phenazopyridine (PYRIDIUM) 100 MG tablet Take 1 tablet (100 mg total) by mouth 3 (three) times daily as needed for pain. 07/13/23   [name] T, PA-C  polyethylene glycol (MIRALAX / GLYCOLAX) 17 g packet Take 17 g by mouth daily as needed for mild constipation.    [provider]  pravastatin (PRAVACHOL) 40 MG tablet Take 40 mg by mouth every evening.    [provider]  sennosides-docusate sodium (SENOKOT-S) 8.6-50 MG tablet Take 1 tablet by mouth  daily as needed for constipation. Patient not taking: Reported on 06/29/2023    [provider]      Allergies    Quinolones, Penicillins, and Sulfa antibiotics    Review of Systems   Review of Systems  Physical Exam Updated Vital Signs BP (!) 176/71   Pulse (!) 59   Temp (!) 97.5 F (36.4 C)   Resp 17   Ht 5\' 3"  (1.6 m)   Wt 67.1 kg   SpO2 98%   BMI 26.22 kg/m  Physical Exam Constitutional:      Appearance: Normal appearance.  HENT:     Head: Normocephalic and atraumatic.  Eyes:     Extraocular Movements: Extraocular movements intact.     Pupils: Pupils are equal, round, and reactive to light.  Cardiovascular:     Rate and Rhythm: Normal rate and regular rhythm.     Heart sounds: No murmur heard.    No friction rub. No gallop.  Pulmonary:     Effort: Pulmonary effort is normal.     Breath sounds: Normal breath sounds.  Abdominal:     General: There is no distension.     Palpations: Abdomen is soft.     Tenderness: There is no abdominal tenderness. There is no right CVA tenderness, left CVA tenderness or guarding.  Musculoskeletal:     Right lower leg: No edema.     Left lower leg: No edema.  Skin:    General: Skin is warm and dry.     Capillary Refill: Capillary refill takes less than 2 seconds.  Neurological:     General: No focal deficit present.     Mental Status: She is alert and oriented to person, place, and time.  Psychiatric:        Mood and Affect: Mood normal.     ED Results / Procedures / Treatments   Labs (all labs ordered are listed, but only abnormal results are displayed) Labs Reviewed  BASIC METABOLIC PANEL - Abnormal; Notable for the following components:      Result Value   Potassium 3.1 (*)    Glucose, Bld 104 (*)    Calcium 8.4 (*)    All other components within normal limits  CBC - Abnormal; Notable for the following components:   RDW 18.7 (*)    All other components within normal limits  TROPONIN I (HIGH SENSITIVITY) -  Abnormal; Notable for the following components:   Troponin I (High Sensitivity) 110 (*)    All other components within normal limits  TROPONIN I (HIGH SENSITIVITY) - Abnormal; Notable for the following components:   Troponin I (High Sensitivity) 178 (*)    All other components within normal limits    EKG EKG Interpretation Date/Time:  Monday July 16 2023 07:31:35 EDT Ventricular Rate:  77 PR Interval:  190 QRS Duration:  103 QT Interval:  437 QTC Calculation: 495 R Axis:   40  Text Interpretation: Sinus rhythm Probable left atrial enlargement Borderline  ST depression, diffuse leads Borderline prolonged QT interval Confirmed by Gerhard Munch [phone]) on 07/16/2023 9:36:28 AM  Radiology No results found.  Procedures Procedures    Medications Ordered in ED Medications  amiodarone (PACERONE) tablet 200 mg (200 mg Oral Given 07/16/23 1014)  cefTRIAXone (ROCEPHIN) 1 g in sodium chloride 0.9 % 100 mL IVPB (0 g Intravenous Stopped 07/16/23 1001)  metoprolol tartrate (LOPRESSOR) tablet 50 mg (50 mg Oral Given 07/16/23 1014)  apixaban (ELIQUIS) tablet 5 mg (5 mg Oral Given 07/16/23 1014)  potassium chloride SA (KLOR-CON M) CR tablet 40 mEq (40 mEq Oral Given 07/16/23 1014)    ED Course/ Medical Decision Making/ A&P                                 Medical Decision Making Patient in A-Fib with RVR as noted by EMS report but spontaneously converted in route to ED. Patient takes Lopressor and Amiodarone for rate/rhythm control and is s/p two inpatient cardioversions. ECG unremarkable for ACS and patient currently in NSR. BP with elevation 170-190's/70-90's. CBC unremarkable. BMP unremarkable aside from mild hypokalemia at 3.1 which was repleted with 40 mEq Klor-Con. Troponin 110 > 178 consistent with demand ischemia but recommending trending. TSH normal 3 weeks ago. Patient has symptomatic UTI which was initially treated with one dose of Rocephin 1 g (further management per primary team).  Given unremarkable work-up aside from UTI, etiology of A-Fib possibly UTI related. Per son, who is present after initial interview, patient has also been under more stress recently with family issues related to other son. Patient continues to endorse palpitations and does not feel safe enough to go home so patient to be admitted for further observation.  Amount and/or Complexity of Data Reviewed Labs: ordered. ECG/medicine tests: ordered.  Risk Prescription drug management.          Final Clinical Impression(s) / ED Diagnoses Final diagnoses:  None    Rx / DC Orders ED Discharge Orders     None         Carmina Miller, DO 07/16/23 1407    Gerhard Munch, MD 07/17/23 [phone]

## 2023-07-17 DIAGNOSIS — R7989 Other specified abnormal findings of blood chemistry: Secondary | ICD-10-CM | POA: Diagnosis not present

## 2023-07-17 DIAGNOSIS — I480 Paroxysmal atrial fibrillation: Secondary | ICD-10-CM | POA: Diagnosis not present

## 2023-07-17 DIAGNOSIS — R0789 Other chest pain: Secondary | ICD-10-CM

## 2023-07-17 DIAGNOSIS — I110 Hypertensive heart disease with heart failure: Secondary | ICD-10-CM | POA: Diagnosis not present

## 2023-07-17 DIAGNOSIS — Z952 Presence of prosthetic heart valve: Secondary | ICD-10-CM | POA: Diagnosis not present

## 2023-07-17 DIAGNOSIS — K219 Gastro-esophageal reflux disease without esophagitis: Secondary | ICD-10-CM | POA: Diagnosis not present

## 2023-07-17 DIAGNOSIS — I5030 Unspecified diastolic (congestive) heart failure: Secondary | ICD-10-CM | POA: Diagnosis not present

## 2023-07-17 DIAGNOSIS — N390 Urinary tract infection, site not specified: Secondary | ICD-10-CM | POA: Diagnosis not present

## 2023-07-17 DIAGNOSIS — R079 Chest pain, unspecified: Secondary | ICD-10-CM | POA: Diagnosis not present

## 2023-07-17 DIAGNOSIS — E876 Hypokalemia: Secondary | ICD-10-CM | POA: Diagnosis not present

## 2023-07-17 DIAGNOSIS — E785 Hyperlipidemia, unspecified: Secondary | ICD-10-CM | POA: Diagnosis not present

## 2023-07-17 DIAGNOSIS — C9000 Multiple myeloma not having achieved remission: Secondary | ICD-10-CM | POA: Diagnosis not present

## 2023-07-17 DIAGNOSIS — I160 Hypertensive urgency: Secondary | ICD-10-CM | POA: Diagnosis not present

## 2023-07-17 DIAGNOSIS — E039 Hypothyroidism, unspecified: Secondary | ICD-10-CM | POA: Diagnosis not present

## 2023-07-17 LAB — BASIC METABOLIC PANEL
Anion gap: 10 (ref 5–15)
BUN: 22 mg/dL (ref 8–23)
CO2: 25 mmol/L (ref 22–32)
Calcium: 8.9 mg/dL (ref 8.9–10.3)
Chloride: 101 mmol/L (ref 98–111)
Creatinine, Ser: 0.83 mg/dL (ref 0.44–1.00)
GFR, Estimated: >60 mL/min (ref >60)
Glucose, Bld: 84 mg/dL (ref 70–99)
Potassium: 3.6 mmol/L (ref 3.5–5.1)
Sodium: 136 mmol/L (ref 135–145)

## 2023-07-17 LAB — CBC
HCT: 41.3 % (ref 36.0–46.0)
Hemoglobin: 13.3 g/dL (ref 12.0–15.0)
MCH: 28.4 pg (ref 26.0–34.0)
MCHC: 32.2 g/dL (ref 30.0–36.0)
MCV: 88.2 fL (ref 80.0–100.0)
Platelets: 84 10*3/uL — ABNORMAL LOW (ref 150–400)
RBC: 4.68 MIL/uL (ref 3.87–5.11)
RDW: 18.5 % — ABNORMAL HIGH (ref 11.5–15.5)
WBC: 4.7 10*3/uL (ref 4.0–10.5)
nRBC: 0 % (ref 0.0–0.2)

## 2023-07-17 LAB — MAGNESIUM: Magnesium: 1.8 mg/dL (ref 1.7–2.4)

## 2023-07-17 MED ORDER — LOSARTAN POTASSIUM 25 MG PO TABS
25.0000 mg | ORAL_TABLET | Freq: Every day | ORAL | Status: DC
  Administered 2023-07-17: 25 mg via ORAL
  Filled 2023-07-17: qty 1

## 2023-07-17 MED ORDER — CEPHALEXIN 250 MG PO CAPS: 250.0000 mg | ORAL_CAPSULE | Freq: Four times a day (QID) | ORAL | 0 refills | Status: DC

## 2023-07-17 MED ORDER — POTASSIUM CHLORIDE CRYS ER 20 MEQ PO TBCR
40.0000 meq | EXTENDED_RELEASE_TABLET | Freq: Once | ORAL | Status: AC
  Administered 2023-07-17: 40 meq via ORAL
  Filled 2023-07-17: qty 2

## 2023-07-17 MED ORDER — CEPHALEXIN 250 MG PO CAPS: 250.0000 mg | ORAL_CAPSULE | Freq: Four times a day (QID) | ORAL | 0 refills | Status: AC

## 2023-07-17 MED ORDER — LOSARTAN POTASSIUM 25 MG PO TABS: 25.0000 mg | ORAL_TABLET | Freq: Every day | ORAL | 2 refills | Status: DC

## 2023-07-17 MED ORDER — SPIRONOLACTONE 25 MG PO TABS: 25.0000 mg | ORAL_TABLET | Freq: Every day | ORAL | 2 refills | Status: DC

## 2023-07-17 NOTE — Evaluation (Signed)
Physical Therapy Brief Evaluation and Discharge Note Patient Details Name: Zoe [name] MRN: [id number] DOB: [birth year] Today's Date: 07/17/2023   History of Present Illness  Pt is 89 year old presented to [facility] on  07/16/23 for chest pain. Pt found to be in afib with RVR and likely demand ischemia.PMH - A-fib on Eliquis and amiodarone, hypothyroidism, HTN, breast cancer, multiple myeloma,aortic stenosis status post TAVR, TIA, bil TKA  Clinical Impression  Pt doing well with mobility and no further acute PT needed.  Ready for dc from PT standpoint. Pt to resume OPPT after DC.         PT Assessment All further PT needs can be met in the next venue of care  Assistance Needed at Discharge  PRN    Equipment Recommendations None recommended by PT  Recommendations for Other Services       Precautions/Restrictions Precautions Precautions: None        Mobility  Bed Mobility Rolling: Modified independent (Device/Increase time)        Transfers Overall transfer level: Modified independent Equipment used: Rollator (4 wheels)                    Ambulation/Gait Ambulation/Gait assistance: Supervision Gait Distance (Feet): 300 Feet Assistive device: Rollator (4 wheels) Gait Pattern/deviations: Step-through pattern, Decreased stride length Gait Speed: Pace WFL General Gait Details: Steady gait with rollator  Home Activity Instructions Home Activity Instructions: Retrun to prior activities  Stairs            Modified Rankin (Stroke Patients Only)        Balance Overall balance assessment: Mild deficits observed, not formally tested                        Pertinent Vitals/Pain PT - Brief Vital Signs All Vital Signs Stable: Yes Pain Assessment Pain Assessment: No/denies pain     Home Living Family/patient expects to be discharged to:: Private residence Living Arrangements: Other relatives Available Help at Discharge: Family;Available  PRN/intermittently Home Environment: Stairs to enter;No rail  Stairs-Number of Steps: 1 Home Equipment: Shower seat;Cane - single point;Rolling Walker (2 wheels);Rollator (4 wheels);Toilet riser        Prior Function Level of Independence: Independent with assistive device(s) Comments: Uses rollator    UE/LE Assessment   UE ROM/Strength/Tone/Coordination: WFL    LE ROM/Strength/Tone/Coordination: [facility]      Communication   Communication Communication: Hearing impairment     Cognition Overall Cognitive Status: Appears within functional limits for tasks assessed/performed       General Comments General comments (skin integrity, edema, etc.): SpO2 95% on RA after amb    Exercises     Assessment/Plan    PT Problem List         PT Visit Diagnosis Other abnormalities of gait and mobility (R26.89)    No Skilled PT Patient is supervision for all activity/mobility   Co-evaluation                AMPAC 6 Clicks Help needed turning from your back to your side while in a flat bed without using bedrails?: None Help needed moving from lying on your back to sitting on the side of a flat bed without using bedrails?: None Help needed moving to and from a bed to a chair (including a wheelchair)?: None Help needed standing up from a chair using your arms (e.g., wheelchair or bedside chair)?: None Help needed to walk in hospital room?:  None Help needed climbing 3-5 steps with a railing? : A Little 6 Click Score: 23      End of Session   Activity Tolerance: Patient tolerated treatment well Patient left: in chair;with call bell/phone within reach Nurse Communication: Mobility status PT Visit Diagnosis: Other abnormalities of gait and mobility (R26.89)     Time: 1610-9604 PT Time Calculation (min) (ACUTE ONLY): 22 min  Charges:   PT Evaluation $PT Eval Low Complexity: 1 Low      [facility] PT Acute Rehabilitation Services Office [phone]   Angelina Ok  [facility]  07/17/2023, 12:56 PM

## 2023-07-17 NOTE — TOC CM/SW Note (Signed)
Transition of Care [facility]) - Inpatient Brief Assessment   Patient Details  Name: Zoe [name] MRN: [id number] Date of Birth: 10-Sep-1934  [facility]) CM/SW Contact:    Gala Lewandowsky, RN Phone Number: 07/17/2023, 2:50 PM   Clinical Narrative: Patient presented for chest pain. Plan for discharge home today. Ambulatory referral submitted for outpatient PT at [facility]. No further needs identified at this time.    Transition of Care Asessment: Insurance and Status: Insurance coverage has been reviewed Patient has primary care physician: Yes Prior/Current Home Services: No current home services Social Determinants of Health Reivew: SDOH reviewed no interventions necessary Readmission risk has been reviewed: Yes Transition of care needs: transition of care needs identified, TOC will continue to follow

## 2023-07-17 NOTE — Progress Notes (Addendum)
Rounding Note    Patient Name: Zoe [name] Date of Encounter: 07/17/2023  [facility] Cardiologist: Yates Decamp, MD   Subjective   Feeling much better this morning. No further episodes of chest pain.   Inpatient Medications    Scheduled Meds:  acyclovir  400 mg Oral BID   amiodarone  200 mg Oral Daily   apixaban  5 mg Oral BID   dapagliflozin propanediol  10 mg Oral Daily   levothyroxine  100 mcg Oral Q0600   metoprolol tartrate  50 mg Oral BID   pantoprazole  40 mg Oral Daily   pravastatin  40 mg Oral QPM   sodium chloride flush  3 mL Intravenous Q12H   spironolactone  25 mg Oral Daily   Continuous Infusions:  cefTRIAXone (ROCEPHIN)  IV     PRN Meds: acetaminophen **OR** acetaminophen, albuterol, polyethylene glycol, senna-docusate   Vital Signs    Vitals:   07/16/23 1708 07/16/23 2009 07/16/23 2134 07/17/23 0314  BP:  (!) 173/75 (!) 136/125 (!) 192/77  Pulse:  61  60  Resp:  20 18 20   Temp: 97.8 F (36.6 C) 98.4 F (36.9 C) 98.1 F (36.7 C) 97.8 F (36.6 C)  TempSrc: Oral Oral Oral Oral  SpO2:  95%  97%  Weight:   65.9 kg   Height:   5\' 3"  (1.6 m)     Intake/Output Summary (Last 24 hours) at 07/17/2023 0754 Last data filed at 07/16/2023 1938 Gross per 24 hour  Intake 50 ml  Output --  Net 50 ml      07/16/2023    9:34 PM 07/16/2023    8:13 AM 07/13/2023   10:08 AM  Last 3 Weights  Weight (lbs) 145 lb 5.8 oz 148 lb 152 lb 3.2 oz  Weight (kg) 65.935 kg 67.132 kg 69.037 kg      Telemetry    Sinus Rhythm, 60s - Personally Reviewed  Physical Exam   GEN: No acute distress.   Neck: No JVD Cardiac: RRR, + systolic murmur, no rubs, or gallops.  Respiratory: Clear to auscultation bilaterally. GI: Soft, nontender, non-distended  MS: No edema; No deformity. Neuro:  Nonfocal  Psych: Normal affect   Labs    High Sensitivity Troponin:   Recent Labs  Lab 07/16/23 0812 07/16/23 1144 07/16/23 1840 07/16/23 2034  TROPONINIHS  110* 178* 136* 150*     Chemistry Recent Labs  Lab 07/13/23 0926 07/16/23 0812 07/17/23 0311  NA 134* 138 136  K 3.9 3.1* 3.6  CL 99 102 101  CO2 27 23 25   GLUCOSE 103* 104* 84  BUN 23 23 22   CREATININE 0.95 0.84 0.83  CALCIUM 8.8* 8.4* 8.9  PROT 5.7*  --   --   ALBUMIN 3.8  --   --   AST 13*  --   --   ALT 11  --   --   ALKPHOS 66  --   --   BILITOT 0.7  --   --   GFRNONAA 58* >60 >60  ANIONGAP 8 13 10     Lipids No results for input(s): "CHOL", "TRIG", "HDL", "LABVLDL", "LDLCALC", "CHOLHDL" in the last 168 hours.  Hematology Recent Labs  Lab 07/13/23 0926 07/16/23 0812 07/17/23 0311  WBC 5.6 5.2 4.7  RBC 4.48 4.71 4.68  HGB 13.5 13.6 13.3  HCT 39.4 42.5 41.3  MCV 87.9 90.2 88.2  MCH 30.1 28.9 28.4  MCHC 34.3 32.0 32.2  RDW 18.7* 18.7* 18.5*  PLT 108* PLATELET CLUMPS NOTED ON SMEAR, UNABLE TO ESTIMATE 84*   Thyroid No results for input(s): "TSH", "FREET4" in the last 168 hours.  BNPNo results for input(s): "BNP", "PROBNP" in the last 168 hours.  DDimer No results for input(s): "DDIMER" in the last 168 hours.   Radiology    DG CHEST PORT 1 VIEW  Result Date: 07/16/2023 CLINICAL DATA:  Near syncope, chest pain. EXAM: PORTABLE CHEST 1 VIEW COMPARISON:  Chest radiographs 04/15/2023. FINDINGS: No consolidation or pulmonary edema. Stable cardiac and mediastinal contours with TAVR in place. Unchanged appearance from prior vertebral augmentation in the lower thoracic spine. No pleural effusion or pneumothorax. IMPRESSION: No evidence of acute cardiopulmonary disease. Electronically Signed   By: Orvan Falconer M.D.   On: 07/16/2023 16:20    Cardiac Studies   N/a   Patient Profile     89 y.o. female with a hx of paroxysmal atrial fibrillation, chronic HFpEF, severe AS status post TAVR by Dr. Cornelius Moras 2019, asymptomatic bilateral carotid artery stenosis, multiple myeloma, hypertension, hyperlipidemia who was seen 07/16/2023 for the evaluation of atrial fibrillation, chest  pain, elevated troponins at the request of Dr. Katrinka Blazing.   Assessment & Plan    Chest pain Elevated troponin (Likely type II MI) -- presented with palpitations and chest pain in the setting of atrial fibrillation with RVR rates reported in the 170s. Converted to SR while en route with EMS. EKG without ischemic changes. hsTn 110>>178>>150. No plans for invasive ischemic evaluation at this time -- continue metoprolol, statin  Paroxysmal atrial fibrillation -- hx of the same, failed prior cardioversion in the past but mostly recent has been in SR at prior office visits. Found to be in afib RVR with rates in the 170s, converted while en route. Atrial fibrillation likely 2/2 UTI, along with electrolyte abnormalities  -- continue amiodarone 200mg  daily, metoprolol 50mg  BID, Eliquis 5mg  BID   HFpEF -- echo showed LVEF 60-65%, no rWMA, g2DD, normal RV function, moderately dilated LA, mild MR -- found to have UTI on admission, farxiga stopped.  -- continue metoprolol 50mg  BID, spiro 25mg  daily added yesterday  AS s/p TAVR -- echo 03/2023 with mean gradient of , AVA of 1.84  HTN -- continue metoprolol 50mg  BID, spiro 25mg  daily, add losartan 25mg  daily   Per Primary UTI Hypokalemia Multiple myeloma Hypothyroidism GERD  For questions or updates, please contact [facility] Please consult [email or web address] for contact info under        Signed, [name], NP  07/17/2023, 7:54 AM    Patient seen, examined. Available data reviewed. Agree with findings, assessment, and plan as outlined by [name], NP.  Patient is independently interviewed and examined.  She is an alert and oriented, elderly woman in no distress.  HEENT is normal, JVP is normal, lungs clear bilaterally, heart regular rate and rhythm no murmur gallop, abdomen soft nontender, extremities have no edema.  Telemetry is reviewed and shows no recurrence of atrial fibrillation.  I agree with continuation of her  amiodarone, metoprolol, and apixaban at current doses.  Her blood pressure has been elevated in the hospital and losartan 25 mg has been added.  She is also started on spironolactone with inability to continue Farxiga in the setting of a urinary tract infection.  Her current medicines appear to be appropriate and would continue them on discharge.  Will arrange follow-up with Dr. Jacinto Halim or his APP.  Patient stable for discharge from a cardiac perspective.  Tonny Bollman, M.D.  07/17/2023 10:20 AM

## 2023-07-17 NOTE — Discharge Summary (Signed)
Physician Discharge Summary  Zoe [name] ZOX:[id number] DOB: 19-Nov-1933 DOA: 07/16/2023  PCP: Irena Reichmann, DO  Admit date: 07/16/2023 Discharge date: 07/17/2023  Admitted From: Home Disposition: Home  Recommendations for Outpatient Follow-up:  Follow up with PCP in 1-2 weeks Follow-up with cardiology as discussed  Home Health: None Equipment/Devices: None  Discharge Condition: Stable CODE STATUS: Full Diet recommendation: Low-fat low-carb low-salt diet  Brief/Interim Summary: Zoe [name] is a 89 y.o. female with medical history significant of hypertension, hyperlipidemia, paroxysmal atrial fibrillation, aortic stenosis s/p TAVR, multiple myeloma, hypothyroidism, and GERD who presents with complaints of chest pain.  Patient admitted as above with acute onset chest pain, atypical in nature, cardiology consulted at intake.  Given further evaluation likely type II NSTEMI provoked from UTI/infection.  There is no indication for procedure at this time.  Patient discharged on remainder of antibiotics with recommendations to hold Farxiga until follow-up with PCP.  Patient otherwise stable and agreeable for discharge home, follow-up with cardiology, Dr. Jacinto Halim, as scheduled.   Discharge Diagnoses:  Principal Problem:   Chest pain Active Problems:   Elevated troponin   Paroxysmal atrial fibrillation (HCC)   Hypertensive urgency   Urinary tract infection due to Klebsiella species   Heart failure with preserved ejection fraction (HCC)   Hypokalemia   Hypocalcemia   S/P TAVR (transcatheter aortic valve replacement)   Hyperlipidemia   Multiple myeloma not having achieved remission (HCC)   Hypothyroidism   GERD (gastroesophageal reflux disease)    Discharge Instructions  Discharge Instructions     Discharge patient   Complete by: As directed    Discharge disposition: 01-Home or Self Care   Discharge patient date: 07/17/2023      Allergies as of 07/17/2023        Reactions   Quinolones Other (See Comments)   Patient on Amiodarone and can prolong QT   Penicillins Other (See Comments)   UNSPECIFIED REACTION  Patient does not remember reaction.  Has patient had a PCN reaction causing immediate rash, facial/tongue/throat swelling, SOB or lightheadedness with hypotension: no Has patient had a PCN reaction causing severe rash involving mucus membranes or skin necrosis: no Has patient had a PCN reaction that required hospitalization no Has patient had a PCN reaction occurring within the last 10 years: no If all of the above answers are "NO", then may proceed with Cephalosporin use.   Sulfa Antibiotics Other (See Comments)   UNSPECIFIED REACTION  "maybe vision issues? "        Medication List     STOP taking these medications    Farxiga 10 MG Tabs tablet Generic drug: dapagliflozin propanediol       TAKE these medications    acetaminophen 500 MG tablet Commonly known as: TYLENOL Take 500 mg by mouth every 6 (six) hours as needed for moderate pain.   acyclovir 400 MG tablet Commonly known as: ZOVIRAX Take 1 tablet (400 mg total) by mouth 2 (two) times daily.   albuterol 108 (90 Base) MCG/ACT inhaler Commonly known as: VENTOLIN HFA Inhale 2 puffs into the lungs every 6 (six) hours as needed for wheezing or shortness of breath.   amiodarone 200 MG tablet Commonly known as: Pacerone Take 1 tablet (200 mg total) by mouth daily.   apixaban 5 MG Tabs tablet Commonly known as: ELIQUIS Take 1 tablet (5 mg total) by mouth 2 (two) times daily.   cephALEXin 250 MG capsule Commonly known as: KEFLEX Take 1 capsule (250 mg total) by mouth  4 (four) times daily for 2 days. Start taking on: July 18, 2023   [employer] VITAMIN B12 1000 MCG tablet Generic drug: cyanocobalamin TAKE 1 TABLET BY MOUTH EVERY DAY   dexamethasone 4 MG tablet Commonly known as: DECADRON Take 5 tablets (20 mg total) by mouth every 14 (fourteen) days. Take in the  morning with food on treatment days What changed:  when to take this additional instructions   esomeprazole 20 MG capsule Commonly known as: NEXIUM Take 20 mg by mouth daily.   lenalidomide 25 MG capsule Commonly known as: REVLIMID Take 1 capsule (25 mg total) by mouth daily. Siri Cole #[id number]  Date Obtained 06/28/23 Take one capsule daily for 21 days none for 7   levothyroxine 100 MCG tablet Commonly known as: SYNTHROID Take 100 mcg by mouth daily before breakfast.   losartan 25 MG tablet Commonly known as: COZAAR Take 1 tablet (25 mg total) by mouth daily.   metoprolol tartrate 50 MG tablet Commonly known as: LOPRESSOR TAKE 1 TABLET BY MOUTH TWICE A DAY   phenazopyridine 100 MG tablet Commonly known as: Pyridium Take 1 tablet (100 mg total) by mouth 3 (three) times daily as needed for pain.   polyethylene glycol 17 g packet Commonly known as: MIRALAX / GLYCOLAX Take 17 g by mouth daily as needed for mild constipation.   pravastatin 40 MG tablet Commonly known as: PRAVACHOL Take 40 mg by mouth every evening.   PreserVision AREDS Caps Take 1 capsule by mouth 2 (two) times daily.   sennosides-docusate sodium 8.6-50 MG tablet Commonly known as: SENOKOT-S Take 1 tablet by mouth daily as needed for constipation.   Soothe XP Soln Place 1 drop into both eyes every evening.   spironolactone 25 MG tablet Commonly known as: ALDACTONE Take 1 tablet (25 mg total) by mouth daily.   Vitamin D3 50 MCG (2000 UT) capsule Take 1 capsule (2,000 Units total) by mouth daily.        Allergies  Allergen Reactions   Quinolones Other (See Comments)    Patient on Amiodarone and can prolong QT   Penicillins Other (See Comments)    UNSPECIFIED REACTION  Patient does not remember reaction.  Has patient had a PCN reaction causing immediate rash, facial/tongue/throat swelling, SOB or lightheadedness with hypotension: no Has patient had a PCN reaction causing severe rash  involving mucus membranes or skin necrosis: no Has patient had a PCN reaction that required hospitalization no Has patient had a PCN reaction occurring within the last 10 years: no If all of the above answers are "NO", then may proceed with Cephalosporin use.    Sulfa Antibiotics Other (See Comments)    UNSPECIFIED REACTION  "maybe vision issues? "    Consultations: Cardiology  Procedures/Studies: DG CHEST PORT 1 VIEW  Result Date: 07/16/2023 CLINICAL DATA:  Near syncope, chest pain. EXAM: PORTABLE CHEST 1 VIEW COMPARISON:  Chest radiographs 04/15/2023. FINDINGS: No consolidation or pulmonary edema. Stable cardiac and mediastinal contours with TAVR in place. Unchanged appearance from prior vertebral augmentation in the lower thoracic spine. No pleural effusion or pneumothorax. IMPRESSION: No evidence of acute cardiopulmonary disease. Electronically Signed   By: Orvan Falconer M.D.   On: 07/16/2023 16:20     Subjective: No acute issues or events overnight, chest pain resolved otherwise denies nausea vomiting diarrhea constipation headache fevers chills or shortness of breath   Discharge Exam: Vitals:   07/17/23 0840 07/17/23 1303  BP:  (!) 159/83  Pulse:  (!) 58  Resp: 20   Temp:  (!) 97.5 F (36.4 C)  SpO2:     Vitals:   07/17/23 0800 07/17/23 0818 07/17/23 0840 07/17/23 1303  BP: 93/76   (!) 159/83  Pulse:    (!) 58  Resp: 18 20 20    Temp: 98.2 F (36.8 C)   (!) 97.5 F (36.4 C)  TempSrc: Oral   Oral  SpO2:      Weight:      Height:        General: Pt is alert, awake, not in acute distress Cardiovascular: RRR, S1/S2 +, no rubs, no gallops Respiratory: CTA bilaterally, no wheezing, no rhonchi Abdominal: Soft, NT, ND, bowel sounds + Extremities: no edema, no cyanosis    The results of significant diagnostics from this hospitalization (including imaging, microbiology, ancillary and laboratory) are listed below for reference.     Microbiology: Recent Results  (from the past 240 hour(s))  Urine Culture     Status: Abnormal   Collection Time: 07/13/23 11:00 AM   Specimen: Urine, Clean Catch  Result Value Ref Range Status   Specimen Description   Final    URINE, CLEAN CATCH Performed at [facility], [address], [place], [place] [address]    Special Requests   Final    URINE, CLEAN CATCH Performed at [facility], [address] [place], [place] [address]    Culture >=100,000 COLONIES/mL KLEBSIELLA PNEUMONIAE (A)  Final   Report Status 07/15/2023 FINAL  Final   Organism ID, Bacteria KLEBSIELLA PNEUMONIAE (A)  Final      Susceptibility   Klebsiella pneumoniae - MIC*    AMPICILLIN RESISTANT Resistant     CEFAZOLIN <=4 SENSITIVE Sensitive     CEFEPIME <=0.12 SENSITIVE Sensitive     CEFTRIAXONE <=0.25 SENSITIVE Sensitive     CIPROFLOXACIN <=0.25 SENSITIVE Sensitive     GENTAMICIN <=1 SENSITIVE Sensitive     IMIPENEM <=0.25 SENSITIVE Sensitive     NITROFURANTOIN 64 INTERMEDIATE Intermediate     TRIMETH/SULFA <=20 SENSITIVE Sensitive     AMPICILLIN/SULBACTAM <=2 SENSITIVE Sensitive     PIP/TAZO <=4 SENSITIVE Sensitive     * >=100,000 COLONIES/mL KLEBSIELLA PNEUMONIAE     Labs: BNP (last 3 results) Recent Labs    02/17/23 0922 04/13/23 0950  BNP 454.3* 266.5*   Basic Metabolic Panel: Recent Labs  Lab 07/13/23 0926 07/16/23 0812 07/17/23 0311 07/17/23 0806  NA 134* 138 136  --   K 3.9 3.1* 3.6  --   CL 99 102 101  --   CO2 27 23 25   --   GLUCOSE 103* 104* 84  --   BUN 23 23 22   --   CREATININE 0.95 0.84 0.83  --   CALCIUM 8.8* 8.4* 8.9  --   MG  --   --   --  1.8   Liver Function Tests: Recent Labs  Lab 07/13/23 0926  AST 13*  ALT 11  ALKPHOS 66  BILITOT 0.7  PROT 5.7*  ALBUMIN 3.8   No results for input(s): "LIPASE", "AMYLASE" in the last 168 hours. No results for input(s): "AMMONIA" in the last 168 hours. CBC: Recent Labs  Lab 07/13/23 0926  07/16/23 0812 07/17/23 0311  WBC 5.6 5.2 4.7  NEUTROABS 3.9  --   --   HGB 13.5 13.6 13.3  HCT 39.4 42.5 41.3  MCV 87.9 90.2 88.2  PLT 108* PLATELET CLUMPS NOTED ON SMEAR, UNABLE TO ESTIMATE 84*  Cardiac Enzymes: No results for input(s): "CKTOTAL", "CKMB", "CKMBINDEX", "TROPONINI" in the last 168 hours. BNP: Invalid input(s): "POCBNP" CBG: No results for input(s): "GLUCAP" in the last 168 hours. D-Dimer No results for input(s): "DDIMER" in the last 72 hours. Hgb A1c No results for input(s): "HGBA1C" in the last 72 hours. Lipid Profile No results for input(s): "CHOL", "HDL", "LDLCALC", "TRIG", "CHOLHDL", "LDLDIRECT" in the last 72 hours. Thyroid function studies No results for input(s): "TSH", "T4TOTAL", "T3FREE", "THYROIDAB" in the last 72 hours.  Invalid input(s): "FREET3" Anemia work up No results for input(s): "VITAMINB12", "FOLATE", "FERRITIN", "TIBC", "IRON", "RETICCTPCT" in the last 72 hours. Urinalysis    Component Value Date/Time   COLORURINE YELLOW 07/13/2023 1030   APPEARANCEUR HAZY (A) 07/13/2023 1030   LABSPEC 1.030 07/13/2023 1030   PHURINE 5.0 07/13/2023 1030   GLUCOSEU >=500 (A) 07/13/2023 1030   HGBUR NEGATIVE 07/13/2023 1030   BILIRUBINUR NEGATIVE 07/13/2023 1030   KETONESUR NEGATIVE 07/13/2023 1030   PROTEINUR NEGATIVE 07/13/2023 1030   NITRITE NEGATIVE 07/13/2023 1030   LEUKOCYTESUR LARGE (A) 07/13/2023 1030   Sepsis Labs Recent Labs  Lab 07/13/23 0926 07/16/23 0812 07/17/23 0311  WBC 5.6 5.2 4.7   Microbiology Recent Results (from the past 240 hour(s))  Urine Culture     Status: Abnormal   Collection Time: 07/13/23 11:00 AM   Specimen: Urine, Clean Catch  Result Value Ref Range Status   Specimen Description   Final    URINE, CLEAN CATCH Performed at [facility], [address], [place], [place] [address]    Special Requests   Final    URINE, CLEAN CATCH Performed at [facility], [address], [place], [place] [address]    Culture >=100,000 COLONIES/mL KLEBSIELLA PNEUMONIAE (A)  Final   Report Status 07/15/2023 FINAL  Final   Organism ID, Bacteria KLEBSIELLA PNEUMONIAE (A)  Final      Susceptibility   Klebsiella pneumoniae - MIC*    AMPICILLIN RESISTANT Resistant     CEFAZOLIN <=4 SENSITIVE Sensitive     CEFEPIME <=0.12 SENSITIVE Sensitive     CEFTRIAXONE <=0.25 SENSITIVE Sensitive     CIPROFLOXACIN <=0.25 SENSITIVE Sensitive     GENTAMICIN <=1 SENSITIVE Sensitive     IMIPENEM <=0.25 SENSITIVE Sensitive     NITROFURANTOIN 64 INTERMEDIATE Intermediate     TRIMETH/SULFA <=20 SENSITIVE Sensitive     AMPICILLIN/SULBACTAM <=2 SENSITIVE Sensitive     PIP/TAZO <=4 SENSITIVE Sensitive     * >=100,000 COLONIES/mL KLEBSIELLA PNEUMONIAE     Time coordinating discharge: Over 30 minutes  SIGNED:   Azucena Fallen, DO [facility] 07/17/2023, 2:03 PM Pager   If 7PM-7AM, please contact night-coverage [email or web address]

## 2023-07-18 ENCOUNTER — Ambulatory Visit: Payer: Medicare Other

## 2023-07-19 LAB — MULTIPLE MYELOMA PANEL, SERUM
Albumin SerPl Elph-Mcnc: 3.1 g/dL (ref 2.9–4.4)
Albumin/Glob SerPl: 1.6 (ref 0.7–1.7)
Alpha 1: 0.3 g/dL (ref 0.0–0.4)
Alpha2 Glob SerPl Elph-Mcnc: 0.7 g/dL (ref 0.4–1.0)
B-Globulin SerPl Elph-Mcnc: 0.7 g/dL (ref 0.7–1.3)
Gamma Glob SerPl Elph-Mcnc: 0.3 g/dL — ABNORMAL LOW (ref 0.4–1.8)
Globulin, Total: 2 g/dL — ABNORMAL LOW (ref 2.2–3.9)
IgA: 35 mg/dL — ABNORMAL LOW (ref 64–422)
IgG (Immunoglobin G), Serum: 362 mg/dL — ABNORMAL LOW (ref 586–1602)
IgM (Immunoglobulin M), Srm: 16 mg/dL — ABNORMAL LOW (ref 26–217)
M Protein SerPl Elph-Mcnc: 0.1 g/dL — ABNORMAL HIGH
Total Protein ELP: 5.1 g/dL — ABNORMAL LOW (ref 6.0–8.5)

## 2023-07-20 ENCOUNTER — Ambulatory Visit: Payer: Medicare Other | Admitting: Hematology and Oncology

## 2023-07-20 ENCOUNTER — Other Ambulatory Visit: Payer: Medicare Other

## 2023-07-20 ENCOUNTER — Ambulatory Visit: Payer: Medicare Other

## 2023-07-21 ENCOUNTER — Ambulatory Visit (HOSPITAL_COMMUNITY)
Admission: EM | Admit: 2023-07-21 | Discharge: 2023-07-21 | Disposition: A | Discharge status: Home / Self Care | Payer: Medicare Other | Attending: Emergency Medicine | Admitting: Emergency Medicine

## 2023-07-21 ENCOUNTER — Encounter (HOSPITAL_COMMUNITY): Payer: Self-pay

## 2023-07-21 DIAGNOSIS — B3731 Acute candidiasis of vulva and vagina: Secondary | ICD-10-CM | POA: Diagnosis not present

## 2023-07-21 LAB — POCT URINALYSIS DIP (MANUAL ENTRY)
Bilirubin, UA: NEGATIVE
Glucose, UA: 100 mg/dL — AB
Ketones, POC UA: NEGATIVE mg/dL
Leukocytes, UA: NEGATIVE
Nitrite, UA: NEGATIVE
Protein Ur, POC: 30 mg/dL — AB
Spec Grav, UA: >=1.03 — AB (ref 1.010–1.025)
Urobilinogen, UA: 0.2 U/dL
pH, UA: 5 (ref 5.0–8.0)

## 2023-07-21 MED ORDER — MICONAZOLE NITRATE 2 % VA CREA: TOPICAL_CREAM | VAGINAL | 0 refills | Status: DC

## 2023-07-21 MED ORDER — FLUCONAZOLE 150 MG PO TABS: 150.0000 mg | ORAL_TABLET | Freq: Every day | ORAL | 0 refills | Status: DC

## 2023-07-21 NOTE — Discharge Instructions (Addendum)
Keep area clean and dry. Take 1 Diflucan tablet today.  May repeat in 3 days if needed. Apply topical ointment at night as needed for itching. Continue home medications as ordered

## 2023-07-21 NOTE — ED Provider Notes (Signed)
[facility]    CSN: [id number] Arrival date & time: 07/21/23  1204      History   Chief Complaint Chief Complaint  Patient presents with   Dysuria    HPI Zoe [name] is a 89 y.o. female.   Patient reporting vaginal itching.  She has recently been in the hospital and has received multiple antibiotics to include IV antibiotics for UTI.  The history is provided by the patient.  Dysuria Chronicity:  Recurrent Recent urinary tract infections: yes   Associated symptoms comment:  Vaginal itching   Past Medical History:  Diagnosis Date   Arthritis    some - per patient   Breast cancer (HCC)    breast cancer / left    Cataract    bilat    GERD (gastroesophageal reflux disease)    History of kidney stones    Hyperlipidemia    Hypertension    Hypothyroidism    Macular degeneration    Left   S/P TAVR (transcatheter aortic valve replacement) 09/03/2018   23 mm Edwards Sapien 3 transcatheter heart valve placed via percutaneous right transfemoral approach    Severe aortic stenosis    Stress incontinence    Thyroid disease    Tinnitus     Patient Active Problem List   Diagnosis Date Noted   Hypertensive urgency 07/16/2023   Urinary tract infection due to Klebsiella species 07/16/2023   Heart failure with preserved ejection fraction (HCC) 07/16/2023   Hypokalemia 07/16/2023   Hypocalcemia 07/16/2023   Atypical atrial flutter (HCC) 04/19/2023   Acute on chronic diastolic heart failure (HCC) 04/19/2023   Atrial fibrillation with rapid ventricular response (HCC) 04/18/2023   History of stroke 04/16/2023   Fall at home, initial encounter 04/16/2023   Atrial fibrillation (HCC) 04/16/2023   Paroxysmal atrial fibrillation (HCC) 04/15/2023   Elevated troponin 04/15/2023   Chest pain 04/13/2023   Stroke due to embolism (HCC) 03/20/2023   Chest pain, rule out acute myocardial infarction 02/17/2023   Chemotherapy-induced neuropathy (HCC) 11/28/2022   Pain  due to onychomycosis of toenails of both feet 11/08/2022   Multiple myeloma not having achieved remission (HCC) 07/24/2022   Chronic diastolic CHF (congestive heart failure) (HCC) 05/16/2022   Symptomatic anemia 05/16/2022   Hyponatremia 04/02/2022   Microcytic anemia 04/02/2022   S/P TAVR (transcatheter aortic valve replacement) 09/03/2018   Hypothyroidism    Hypertension    Hyperlipidemia    GERD (gastroesophageal reflux disease)    S/P right TKA 06/18/2017   Class 1 obesity 03/15/2016   S/P left TKA 03/13/2016   History of breast cancer, DCIS, lumpectomy March 2000 12/13/2011    Past Surgical History:  Procedure Laterality Date   ABDOMINAL HYSTERECTOMY  1970's   BACK SURGERY     BREAST LUMPECTOMY  12/1998   lumpectomy   CARDIAC CATHETERIZATION     CARDIOVERSION N/A 04/18/2023   Procedure: CARDIOVERSION;  Surgeon: Tessa Lerner, DO;  Location: [facility];  Service: Cardiovascular;  Laterality: N/A;   CARDIOVERSION N/A 04/20/2023   Procedure: CARDIOVERSION;  Surgeon: Yates Decamp, MD;  Location[facility] CV [facility];  Service: Cardiovascular;  Laterality: N/A;   EYE SURGERY     cataract surgery bilat    INTRAOPERATIVE TRANSTHORACIC ECHOCARDIOGRAM N/A 09/03/2018   Procedure: INTRAOPERATIVE TRANSTHORACIC ECHOCARDIOGRAM;  Surgeon: Kathleene Hazel, MD;  Location: [facility] OR;  Service: Open Heart Surgery;  Laterality: N/A;   KYPHOPLASTY N/A 09/07/2022   Procedure: THORACIC EIGHT KYPHOPLASTY;  Surgeon: Venita Lick,  MD;  Location: [facility] OR;  Service: Orthopedics;  Laterality: N/A;  1 hr Local with [facility] 3 C-Bed   LITHOTRIPSY     Right total knee     2018 Dr. Charlann Boxer   RIGHT/LEFT HEART CATH AND CORONARY ANGIOGRAPHY N/A 08/06/2018   Procedure: RIGHT/LEFT HEART CATH AND CORONARY ANGIOGRAPHY;  Surgeon: Yates Decamp, MD;  Location: [facility];  Service: Cardiovascular;  Laterality: N/A;   THYROIDECTOMY, PARTIAL  1975   TONSILLECTOMY     as a child - patient not sure of  exact date   TOTAL KNEE ARTHROPLASTY Left 03/13/2016   Procedure: TOTAL KNEE ARTHROPLASTY;  Surgeon: Durene Romans, MD;  Location: [facility];  Service: Orthopedics;  Laterality: Left;   TOTAL KNEE ARTHROPLASTY Right 06/18/2017   Procedure: RIGHT TOTAL KNEE ARTHROPLASTY;  Surgeon: Durene Romans, MD;  Location: [facility];  Service: Orthopedics;  Laterality: Right;   TRANSCATHETER AORTIC VALVE REPLACEMENT, TRANSFEMORAL N/A 09/03/2018   Procedure: TRANSCATHETER AORTIC VALVE REPLACEMENT, TRANSFEMORAL;  Surgeon: Kathleene Hazel, MD;  Location: [facility] OR;  Service: Open Heart Surgery;  Laterality: N/A;    OB History   No obstetric history on file.      Home Medications    Prior to Admission medications   Medication Sig Start Date End Date Taking? Authorizing Provider  fluconazole (DIFLUCAN) 150 MG tablet Take 1 tablet (150 mg total) by mouth daily. May repeat in 3 days if needed 07/21/23  Yes Krysta Bloomfield, Linde Gillis, NP  miconazole (MONISTAT 7) 2 % vaginal cream Apply thin layer to external vaginal area nightly 07/21/23  Yes Davina Howlett, Linde Gillis, NP  acetaminophen (TYLENOL) 500 MG tablet Take 500 mg by mouth every 6 (six) hours as needed for moderate pain.    [provider]  acyclovir (ZOVIRAX) 400 MG tablet Take 1 tablet (400 mg total) by mouth 2 (two) times daily. 03/16/23   Jaci Standard, MD  albuterol (VENTOLIN HFA) 108 (90 Base) MCG/ACT inhaler Inhale 2 puffs into the lungs every 6 (six) hours as needed for wheezing or shortness of breath. 05/10/23   Luciano Cutter, MD  amiodarone (PACERONE) 200 MG tablet Take 1 tablet (200 mg total) by mouth daily. 04/20/23   Yates Decamp, MD  apixaban (ELIQUIS) 5 MG TABS tablet Take 1 tablet (5 mg total) by mouth 2 (two) times daily. 03/01/23 08/28/23  Yates Decamp, MD  Artificial Tear Solution (SOOTHE XP) SOLN Place 1 drop into both eyes every evening.    [provider]  Cholecalciferol (VITAMIN D3) 50 MCG (2000 UT) capsule Take 1 capsule (2,000 Units  total) by mouth daily. 09/11/22   Jaci Standard, MD  [employer] VITAMIN B12 1000 MCG tablet TAKE 1 TABLET BY MOUTH EVERY DAY 02/15/23   [name] T, PA-C  dexamethasone (DECADRON) 4 MG tablet Take 5 tablets (20 mg total) by mouth every 14 (fourteen) days. Take in the morning with food on treatment days Patient taking differently: Take 20 mg by mouth every 21 ( twenty-one) days. 06/01/23   Jaci Standard, MD  esomeprazole (NEXIUM) 20 MG capsule Take 20 mg by mouth daily. 12/06/21   [provider]  lenalidomide (REVLIMID) 25 MG capsule Take 1 capsule (25 mg total) by mouth daily. Siri Cole #[id number]  Date Obtained 06/28/23 Take one capsule daily for 21 days none for 7 06/28/23   Jaci Standard, MD  levothyroxine (SYNTHROID, LEVOTHROID) 100 MCG tablet Take 100 mcg by mouth daily before breakfast.  12/29/15   [provider]  losartan (COZAAR) 25 MG tablet Take 1 tablet (25 mg total) by mouth daily. 07/17/23   Azucena Fallen, MD  metoprolol tartrate (LOPRESSOR) 50 MG tablet TAKE 1 TABLET BY MOUTH TWICE A DAY 07/10/23   Yates Decamp, MD  Multiple Vitamins-Minerals (PRESERVISION AREDS) CAPS Take 1 capsule by mouth 2 (two) times daily.    [provider]  phenazopyridine (PYRIDIUM) 100 MG tablet Take 1 tablet (100 mg total) by mouth 3 (three) times daily as needed for pain. 07/13/23   [name] T, PA-C  polyethylene glycol (MIRALAX / GLYCOLAX) 17 g packet Take 17 g by mouth daily as needed for mild constipation.    [provider]  pravastatin (PRAVACHOL) 40 MG tablet Take 40 mg by mouth every evening.    [provider]  sennosides-docusate sodium (SENOKOT-S) 8.6-50 MG tablet Take 1 tablet by mouth daily as needed for constipation.    [provider]  spironolactone (ALDACTONE) 25 MG tablet Take 1 tablet (25 mg total) by mouth daily. 07/17/23   Azucena Fallen, MD    Family History Family History  Problem Relation Age of Onset   Diabetes  Mother    Stroke Mother        Carotid artery disease   Heart disease Father        CAD   Coronary artery disease Father    Diabetes Sister     Social History Social History   Tobacco Use   Smoking status: Never   Smokeless tobacco: Never  Vaping Use   Vaping status: Never Used  Substance Use Topics   Alcohol use: No   Drug use: No     Allergies   Quinolones, Penicillins, and Sulfa antibiotics   Review of Systems Review of Systems  Genitourinary:  Positive for dysuria.       Vaginal itching and burning     Physical Exam Triage Vital Signs ED Triage Vitals  Encounter Vitals Group     BP 07/21/23 1230 132/75     Systolic BP Percentile --      Diastolic BP Percentile --      Pulse Rate 07/21/23 1230 69     Resp 07/21/23 1230 16     Temp 07/21/23 1230 98.2 F (36.8 C)     Temp Source 07/21/23 1230 Oral     SpO2 07/21/23 1230 97 %     Weight 07/21/23 1230 148 lb (67.1 kg)     Height 07/21/23 1230 5\' 3"  (1.6 m)     Head Circumference --      Peak Flow --      Pain Score 07/21/23 1229 4     Pain Loc --      Pain Education --      Exclude from Growth Chart --    No data found.  Updated Vital Signs BP 132/75 (BP Location: Right Arm)   Pulse 69   Temp 98.2 F (36.8 C) (Oral)   Resp 16   Ht 5\' 3"  (1.6 m)   Wt 148 lb (67.1 kg)   SpO2 97%   BMI 26.22 kg/m   Visual Acuity Right Eye Distance:   Left Eye Distance:   Bilateral Distance:    Right Eye Near:   Left Eye Near:    Bilateral Near:     Physical Exam Genitourinary:    Comments: Exam deferred.  Reports itching and burning worse at night.  UC Treatments / Results  Labs (all labs ordered are listed, but only abnormal results are displayed) Labs Reviewed  POCT URINALYSIS DIP (MANUAL ENTRY) - Abnormal; Notable for the following components:      Result Value   Glucose, UA =100 (*)    Spec Grav, UA >=1.030 (*)    Blood, UA small (*)    Protein Ur, POC =30 (*)    All other components  within normal limits    EKG   Radiology No results found.  Procedures Procedures (including critical care time)  Medications Ordered in UC Medications - No data to display  Initial Impression / Assessment and Plan / UC Course  I have reviewed the triage vital signs and the nursing notes.  Pertinent labs & imaging results that were available during my care of the patient were reviewed by me and considered in my medical decision making (see chart for details).   Patient is presenting with symptoms of vaginal yeast infection.  She has been on multiple antibiotics recently to include IV antibiotics in the hospital.  She is reporting vaginal itching continuously.  Itching is sometimes worse at night.  Vaginal exam was deferred.  Diflucan 1 tab orally and then repeat in 3 days if needed Topical Miconazole cream for external use.   Final Clinical Impressions(s) / UC Diagnoses   Final diagnoses:  Yeast infection of the vagina     Discharge Instructions      Keep area clean and dry. Take 1 Diflucan tablet today.  May repeat in 3 days if needed. Apply topical ointment at night as needed for itching. Continue home medications as ordered    ED Prescriptions     Medication Sig Dispense Auth. Provider   fluconazole (DIFLUCAN) 150 MG tablet Take 1 tablet (150 mg total) by mouth daily. May repeat in 3 days if needed 2 tablet Nishanth Mccaughan M, NP   miconazole (MONISTAT 7) 2 % vaginal cream Apply thin layer to external vaginal area nightly 45 g Anae Hams, Linde Gillis, NP      PDMP not reviewed this encounter.   Nelda Marseille, NP 07/21/23 1433

## 2023-07-21 NOTE — ED Triage Notes (Signed)
Patient here today with c/o burning in urination for over a week. Recently in hospital for Afib and prescribed Cephalexin with no relief.

## 2023-07-23 LAB — IFE, DARA-SPECIFIC, SERUM
IgA: 34 mg/dL — ABNORMAL LOW (ref 64–422)
IgG (Immunoglobin G), Serum: 396 mg/dL — ABNORMAL LOW (ref 586–1602)
IgM (Immunoglobulin M), Srm: 17 mg/dL — ABNORMAL LOW (ref 26–217)

## 2023-07-24 ENCOUNTER — Ambulatory Visit: Payer: Medicare Other

## 2023-07-24 ENCOUNTER — Telehealth: Payer: Self-pay | Admitting: Cardiology

## 2023-07-24 NOTE — Telephone Encounter (Signed)
Spoke with the patient who states that she was in the hospital recently and they changed her medications around. She also was diagnosed with a UTI and a yeast infection so has been on a couple of antibiotics. She was recently restarted on losartan and spironolactone. She reports that she has been having trouble with dizziness recently. She does not monitor her blood pressure or her heart rate at home but states that when it was checked recently blood pressure was 135/70. She states that she does have a blood pressure cuff which I have advised her to start using to monitor things at home. Encouraged her to stay hydrated and make sure that she is changing positions slowly. I will make Dr. Jacinto Halim aware to see if he has any further recommendations.

## 2023-07-24 NOTE — Telephone Encounter (Signed)
Pt c/o medication issue:  1. Name of Medication:  spironolactone (ALDACTONE) 25 MG tablet  2. How are you currently taking this medication (dosage and times per day)?   3. Are you having a reaction (difficulty breathing--STAT)?   4. What is your medication issue?   Patient states she had had multiple ED + urgent care visits recently and she would like to know if Dr. Jacinto Halim is able to review her ED notes. She states she was prescribed Spironolactone while admitted and she has concerns because it has caused dizziness and grogginess.

## 2023-07-25 ENCOUNTER — Other Ambulatory Visit: Payer: Self-pay | Admitting: *Deleted

## 2023-07-25 MED ORDER — LENALIDOMIDE 10 MG PO CAPS: 10.0000 mg | ORAL_CAPSULE | Freq: Every day | ORAL | 0 refills | Status: DC

## 2023-07-25 NOTE — Telephone Encounter (Signed)
Agree with the above, will continue to monitior

## 2023-07-25 NOTE — Telephone Encounter (Signed)
Patient is calling for update and she hasn't taking her medication yet. Please advise

## 2023-07-25 NOTE — Telephone Encounter (Signed)
Spoke with pt who is calling to see if Dr Jacinto Halim had replied to her request from 07/24/2023.  Pt advised Dr Jacinto Halim replied that he agreed and to continue to monitor BP and any symptoms.  Pt is scheduled to see Dr Jacinto Halim on 07/31/2023.  Pt plans to keep that appointment and states she will continue to monitor and will contact office if symptoms continue or worsen.  She thanked Charity fundraiser for the call.

## 2023-07-26 ENCOUNTER — Ambulatory Visit: Payer: Medicare Other | Attending: Hematology and Oncology

## 2023-07-26 DIAGNOSIS — M6281 Muscle weakness (generalized): Secondary | ICD-10-CM | POA: Diagnosis not present

## 2023-07-26 DIAGNOSIS — R262 Difficulty in walking, not elsewhere classified: Secondary | ICD-10-CM | POA: Insufficient documentation

## 2023-07-26 DIAGNOSIS — C9000 Multiple myeloma not having achieved remission: Secondary | ICD-10-CM | POA: Insufficient documentation

## 2023-07-26 DIAGNOSIS — R2689 Other abnormalities of gait and mobility: Secondary | ICD-10-CM | POA: Diagnosis not present

## 2023-07-26 NOTE — Therapy (Signed)
OUTPATIENT PHYSICAL THERAPY  LOWER EXTREMITY ONCOLOGY TREATMENT  Patient Name: Zoe [name] MRN: [id number] DOB:1934-03-14, 89 y.o., female Today's Date: 07/26/2023  END OF SESSION:  PT End of Session - 07/26/23 1003     Visit Number 3    Number of Visits 12    Date for PT Re-Evaluation 08/20/23    PT Start Time 1003    PT Stop Time 1048    PT Time Calculation (min) 45 min    Activity Tolerance Patient tolerated treatment well    Behavior During Therapy [facility] for tasks assessed/performed             Past Medical History:  Diagnosis Date   Arthritis    some - per patient   Breast cancer (HCC)    breast cancer / left    Cataract    bilat    GERD (gastroesophageal reflux disease)    History of kidney stones    Hyperlipidemia    Hypertension    Hypothyroidism    Macular degeneration    Left   S/P TAVR (transcatheter aortic valve replacement) 09/03/2018   23 mm Edwards Sapien 3 transcatheter heart valve placed via percutaneous right transfemoral approach    Severe aortic stenosis    Stress incontinence    Thyroid disease    Tinnitus    Past Surgical History:  Procedure Laterality Date   ABDOMINAL HYSTERECTOMY  1970's   BACK SURGERY     BREAST LUMPECTOMY  12/1998   lumpectomy   CARDIAC CATHETERIZATION     CARDIOVERSION N/A 04/18/2023   Procedure: CARDIOVERSION;  Surgeon: Tessa Lerner, DO;  Location: [facility] INVASIVE CV LAB;  Service: Cardiovascular;  Laterality: N/A;   CARDIOVERSION N/A 04/20/2023   Procedure: CARDIOVERSION;  Surgeon: Yates Decamp, MD;  Location: [facility] INVASIVE CV LAB;  Service: Cardiovascular;  Laterality: N/A;   EYE SURGERY     cataract surgery bilat    INTRAOPERATIVE TRANSTHORACIC ECHOCARDIOGRAM N/A 09/03/2018   Procedure: INTRAOPERATIVE TRANSTHORACIC ECHOCARDIOGRAM;  Surgeon: Kathleene Hazel, MD;  Location: [facility] OR;  Service: Open Heart Surgery;  Laterality: N/A;   KYPHOPLASTY N/A 09/07/2022   Procedure: THORACIC EIGHT KYPHOPLASTY;  Surgeon:  Venita Lick, MD;  Location: [facility] OR;  Service: Orthopedics;  Laterality: N/A;  1 hr Local with [facility] 3 C-Bed   LITHOTRIPSY     Right total knee     2018 Dr. Charlann Boxer   RIGHT/LEFT HEART CATH AND CORONARY ANGIOGRAPHY N/A 08/06/2018   Procedure: RIGHT/LEFT HEART CATH AND CORONARY ANGIOGRAPHY;  Surgeon: Yates Decamp, MD;  Location: [facility] INVASIVE CV LAB;  Service: Cardiovascular;  Laterality: N/A;   THYROIDECTOMY, PARTIAL  1975   TONSILLECTOMY     as a child - patient not sure of exact date   TOTAL KNEE ARTHROPLASTY Left 03/13/2016   Procedure: TOTAL KNEE ARTHROPLASTY;  Surgeon: Durene Romans, MD;  Location: [facility] ORS;  Service: Orthopedics;  Laterality: Left;   TOTAL KNEE ARTHROPLASTY Right 06/18/2017   Procedure: RIGHT TOTAL KNEE ARTHROPLASTY;  Surgeon: Durene Romans, MD;  Location: [facility] ORS;  Service: Orthopedics;  Laterality: Right;   TRANSCATHETER AORTIC VALVE REPLACEMENT, TRANSFEMORAL N/A 09/03/2018   Procedure: TRANSCATHETER AORTIC VALVE REPLACEMENT, TRANSFEMORAL;  Surgeon: Kathleene Hazel, MD;  Location: [facility] OR;  Service: Open Heart Surgery;  Laterality: N/A;   Patient Active Problem List   Diagnosis Date Noted   Hypertensive urgency 07/16/2023   Urinary tract infection due to Klebsiella species 07/16/2023   Heart failure with preserved ejection fraction (HCC) 07/16/2023  Hypokalemia 07/16/2023   Hypocalcemia 07/16/2023   Atypical atrial flutter (HCC) 04/19/2023   Acute on chronic diastolic heart failure (HCC) 04/19/2023   Atrial fibrillation with rapid ventricular response (HCC) 04/18/2023   History of stroke 04/16/2023   Fall at home, initial encounter 04/16/2023   Atrial fibrillation (HCC) 04/16/2023   Paroxysmal atrial fibrillation (HCC) 04/15/2023   Elevated troponin 04/15/2023   Chest pain 04/13/2023   Stroke due to embolism (HCC) 03/20/2023   Chest pain, rule out acute myocardial infarction 02/17/2023   Chemotherapy-induced neuropathy (HCC) 11/28/2022   Pain due to  onychomycosis of toenails of both feet 11/08/2022   Multiple myeloma not having achieved remission (HCC) 07/24/2022   Chronic diastolic CHF (congestive heart failure) (HCC) 05/16/2022   Symptomatic anemia 05/16/2022   Hyponatremia 04/02/2022   Microcytic anemia 04/02/2022   S/P TAVR (transcatheter aortic valve replacement) 09/03/2018   Hypothyroidism    Hypertension    Hyperlipidemia    GERD (gastroesophageal reflux disease)    S/P right TKA 06/18/2017   Class 1 obesity 03/15/2016   S/P left TKA 03/13/2016   History of breast cancer, DCIS, lumpectomy March 2000 12/13/2011    PCP: Dr. Irena Reichmann  REFERRING PROVIDER: Dr. Jeanie Sewer, IV  REFERRING DIAG: Multiple Myeloma  THERAPY DIAG:  Multiple myeloma without remission (HCC)  Muscle weakness (generalized)  Difficulty walking  Balance problem  ONSET DATE: 07/2022  Rationale for Evaluation and Treatment: Rehabilitation  SUBJECTIVE:                                                                                                                                                                                           SUBJECTIVE STATEMENT:  My new meds have made me a litle woozy. I dont feel pain today,but I don't feel well balanced. I was in the hospital for a few days and they changed my meds. Now I am on antibiotics for a UTI  eval I need to get my balance better so I can move without fear of falling. Difficult getting up and down from chairs, Some fatigue with walking short distances;ie car into clinic. Reports some foot numbness if she is cold but feels good with socks on. Her grandson lives with her, but she has a home health aid in the day time and the night time for safety PERTINENT HISTORY:  Pt was diagnosed with  IgGMultiple Myeloma in 07/2022 after a bone Marrow biopsy showed 60% plasma cells and anemia. No lytic lesions. She was started on chemotherapy. She had surgery for T 8 compression fracture kyphoplasty on  09/07/2022. She was hospitalized 04/15/2023 for Atrial Fibrillation with cardioversion  attempted on 04/18/2023. She had some PT at that time. She has a home health aide that drives her to appts and helps with keeping her house clean And someone at night to help her get to the bathroom  PMH significant for A-fib on Eliquis and amiodarone, hypothyroidism, HTN, breast cancer, multiple myeloma,aortic stenosis status post TAVR, hx of TIA, Macular degeneration, Head aches she thinks related to her eyes PAIN:  Are you having pain? Yes NPRS scale: 6/10 Pain location: legs and knees, Back Pain orientation: Bilateral  PAIN TYPE:  aching, tingling Pain description: constant  Aggravating factors: standing,cooking Relieving factors: tylenol  PRECAUTIONS: PMH significant of A-fib on Eliquis and amiodarone, hypothyroidism, HTN, Left breast cancer, multiple myeloma,aortic stenosis status post TAVR, hx of TIA, T8 compression fx with kyphoplasty  RED FLAGS: Bowel or bladder incontinence: Yes:   and Compression fracture: Yes: had a kyphoplasty    WEIGHT BEARING RESTRICTIONS: No  FALLS:  Has patient fallen in last 6 months? Yes. Number of falls 1, fell in community garden: no injury  LIVING ENVIRONMENT: Lives with: Lucila Maine lives with her presently Lives in: House/apartment Stairs: No;  Has following equipment at home: Single point cane, Environmental consultant - 4 wheeled, and shower chair  OCCUPATION: Retired  LEISURE: read,visit with friends  PRIOR LEVEL OF FUNCTION: Independent with basic ADLs, Independent with household mobility with device, and Needs assistance with homemaking  PATIENT GOALS: Be able to walk more securely, not be dependent on a walker/rollator   OBJECTIVE:  COGNITION: Overall cognitive status: deficits  PALPATION:   OBSERVATIONS / OTHER ASSESSMENTS: Ambulates with rollator, flexed posture/increased kyphosis  SENSATION: Light touch: deficits   POSTURE: forward head, rounded  shoulders, increased thoracic kyphosis  LOWER EXTREMITY STRENGTH:  MMT Right eval  Hip flexion 4-  Hip extension Able to bridge  Hip abduction 4-  Hip adduction 4 -seated  Hip internal rotation 3-  Hip external rotation 3+  Knee flexion 4+  Knee extension 4+  Ankle dorsiflexion 5  Ankle plantarflexion   Ankle inversion   Ankle eversion   Great toe extension    (Blank rows = not tested)  MMT LEFT eval  Hip flexion 4  Hip extension Able to bridge  Hip abduction 4-  Hip adduction 4-  Hip internal rotation 3+  Hip external rotation 3  Knee flexion 4+  Knee extension 4  Ankle dorsiflexion 5  Ankle plantarflexion   Ankle inversion   Ankle eversion   Great toe extension     (Blank rows = not tested)  LYMPHEDEMA ASSESSMENTS:   SURGERY TYPE/DATE: Left lumpectomy 2000  NUMBER OF LYMPH NODES REMOVED: Thinks so, not sure how many  CHEMOTHERAPY: Yes for Multiple Myeloma, No for Breast Cancer  RADIATION:No for Breast Cancer  HORMONE TREATMENT: Yes for Breast Cancer  INFECTIONS: NO  LYMPHEDEMA ASSESSMENTS:   LOWER EXTREMITY LANDMARK RIGHT eval  At groin   30 cm proximal to suprapatella   20 cm proximal to suprapatella   10 cm proximal to suprapatella   At midpatella / popliteal crease   30 cm proximal to floor at lateral plantar foot   20 cm proximal to floor at lateral plantar foot   10 cm proximal to floor at lateral plantar foot   Circumference of ankle/heel   5 cm proximal to 1st MTP joint   Across MTP joint   Around proximal great toe   (Blank rows = not tested)  LOWER EXTREMITY LANDMARK LEFT eval  At groin   30  cm proximal to suprapatella   20 cm proximal to suprapatella   10 cm proximal to suprapatella   At midpatella / popliteal crease   30 cm proximal to floor at lateral plantar foot   20 cm proximal to floor at lateral plantar foot   10 cm proximal to floor at lateral plantar foot   Circumference of ankle/heel   5 cm proximal to 1st MTP  joint   Across MTP joint   Around proximal great toe   (Blank rows = not tested)  FUNCTIONAL TESTS:  30 seconds sit to stand test: 5 repetitions with use of UE's  Timed up and go: 30.4 seconds with rollator and use of hands to rise from chair  Four position balance test:  able to maintain DLS feet together x 10 sec, and half tandem with left foot forward x 10 sec. , unable to hold half tandem with right foot forward for greater than 3 seconds, unable to maintain tandem stance bilaterally for more than a few seconds. Did not test SLS GAIT: Distance walked: Nustep to ramp in front of building Assistive device utilized: Rollator Level of assistance: Modified independence Comments:flexed posture, increased kyphosis    Outcome measure:  TODAY'S TREATMENT:                                                                                                                                          DATE:   07/26/2023 Took pts blood pressure 124/60 O2 97, HR 53,  prior to rx Nu Step;Seat 8, UE 8, Lev 2 x 2:30 x 2 reps with rest between sets HR 63 after 2:30 seconds, after seated rest HR 53, O2 100% Heel raises x 15 seated Toe raises x 15 Marching x 15 seated Seated LAQ  2x 10 alternating with 2# B Ball squeeze x 10 Updated HEP with pictures;gave large copy due to Macular degeneration Treatment decreased secondary to pt feeling a little dizzy and fatigued due to sickness the last few weeks and new meds.     07/12/2023 Assessed MMT  B Sit to stand from slightly higher mat table 2 x 5 with emphasis on control and form without use of hands Nu Step;Seat 7, UE 8, Lev 2 x 5 min, 238 steps Heel raises x 10 with HH in bars Marching x 10 with HH, in bars Hip abd x 10 B, HH Seated LAQ  2x 10 alternating with 2# B Ball squeeze x 10 Bridging 2 x 5 Piriformis stretch x 2 ea 20 sec supine Updated HEP with pictures;gave large copy due to Macular degeneration  EVAL Discussed POC and treatment  interventions including, strength, postural education, balance and function   PATIENT EDUCATION:  Access Code: [email or web address] URL: [email or web address] Date: 07/12/2023 Prepared by: Alvira Monday  Exercises - Supine Bridge  - 1 x daily - 7 x weekly - 1 sets - 10 reps -  Seated Long Arc Quad  - 1 x daily - 7 x weekly - 1 sets - 10 reps - Standing Hip Abduction with Counter Support  - 1 x daily - 7 x weekly - 1 sets - 10 reps - Seated March  - 1 x daily - 7 x weekly - 1 sets - 10 reps - Standing March with Counter Support  - 1 x daily - 7 x weekly - 1 sets - 10 reps - Standing Heel Raise  - 1 x daily - 7 x weekly - 1 sets - 10 reps - Seated Hip Adduction Isometrics with Ball  - 1 x daily - 7 x weekly - 1 sets - 10 reps - 5 hold Education details: Discussed POC and treatment interventions including, strength, balance and function Person educated: Patient Education method: Explanation Education comprehension: verbalized understanding  HOME EXERCISE PROGRAM:  Supine Bridge  - 1 x daily - 7 x weekly - 1 sets - 10 reps - Seated Long Arc Quad  - 1 x daily - 7 x weekly - 1 sets - 10 reps - Standing Hip Abduction with Counter Support  - 1 x daily - 7 x weekly - 1 sets - 10 reps - Seated March  - 1 x daily - 7 x weekly - 1 sets - 10 reps - Standing March with Counter Support  - 1 x daily - 7 x weekly - 1 sets - 10 reps - Standing Heel Raise  - 1 x daily - 7 x weekly - 1 sets - 10 reps - Seated Hip Adduction Isometrics with Ball  - 1 x daily - 7 x weekly - 1 sets - 10 reps - 5 hold  ASSESSMENT:  CLINICAL IMPRESSION:  Pts vital signs monitored and BP slightly low with diastolic and HR 53 at rest. Exercises were modifeid today due to pt fatigue and request. Did very well with seated exs, but held standing due to dizziness.  OBJECTIVE IMPAIRMENTS: decreased activity tolerance, decreased balance, decreased cognition, decreased endurance, decreased knowledge of condition, difficulty  walking, decreased strength, impaired sensation, impaired vision/preception, postural dysfunction, and pain.   ACTIVITY LIMITATIONS: carrying, lifting, standing, squatting, bed mobility, dressing, and locomotion level  PARTICIPATION LIMITATIONS: meal prep, cleaning, laundry, driving, shopping, and community activity  PERSONAL FACTORS: Age and 3+ comorbidities: multiple myeloma,Macular Degeneration , Afib are also affecting patient's functional outcome.   REHAB POTENTIAL: Good  CLINICAL DECISION MAKING: Evolving/moderate complexity  EVALUATION COMPLEXITY: Low   GOALS: Goals reviewed with patient? Yes  SHORT TERM GOALS: Target date: 07/30/2023  Pt will be independent in a HEP for LE/core strength Baseline: Goal status: INITIAL  2.  Pt will be able to maintain half tandem stance bilaterally x 10 sec Baseline:  Goal status: INITIAL  3.  Pt will perform 6-7 sit to stands in 30 seconds to decrease fall risk Baseline:  Goal status: INITIAL  4.  Pt will perform TUG in 26 seconds to decrease fall risk Baseline:  Goal status: INITIAL   LONG TERM GOALS: Target date: 08/20/2023  Pt will be able to maintain tandem stance for 10 seconds B to demonstrate improved balance Baseline:  Goal status: INITIAL  2.  Pt will be able to perform 9 sit to stands in 30 seconds for average score to decrease fall risk Baseline:  Goal status: INITIAL  3.  Pt will be able to perform TUG with rollator in 24 seconds or less for improved gait speed and decreased fall risk Baseline:  Goal status: INITIAL  4.  Pt will be independent in more advanced HEP Baseline:  Goal status: INITIAL   PLAN:  PT FREQUENCY: 2x/week  PT DURATION: 6 weeks  PLANNED INTERVENTIONS: Therapeutic exercises, Therapeutic activity, Neuromuscular re-education, Balance training, Gait training, Patient/Family education, Self Care, and Manual therapy  PLAN FOR NEXT SESSION:  , progress  strength,balance, function,  gait   Waynette Buttery, PT 07/26/2023, 10:52 AM

## 2023-07-27 ENCOUNTER — Ambulatory Visit: Payer: Medicare Other | Admitting: Hematology and Oncology

## 2023-07-27 ENCOUNTER — Other Ambulatory Visit: Payer: Self-pay | Admitting: *Deleted

## 2023-07-27 ENCOUNTER — Inpatient Hospital Stay: Payer: Medicare Other | Attending: Physician Assistant

## 2023-07-27 ENCOUNTER — Other Ambulatory Visit: Payer: Medicare Other

## 2023-07-27 ENCOUNTER — Inpatient Hospital Stay: Payer: Medicare Other | Admitting: Hematology and Oncology

## 2023-07-27 ENCOUNTER — Inpatient Hospital Stay: Payer: Medicare Other

## 2023-07-27 ENCOUNTER — Ambulatory Visit: Payer: Medicare Other

## 2023-07-27 VITALS — BP 128/55 | Resp 16

## 2023-07-27 DIAGNOSIS — R251 Tremor, unspecified: Secondary | ICD-10-CM | POA: Insufficient documentation

## 2023-07-27 DIAGNOSIS — R3915 Urgency of urination: Secondary | ICD-10-CM | POA: Insufficient documentation

## 2023-07-27 DIAGNOSIS — C9000 Multiple myeloma not having achieved remission: Secondary | ICD-10-CM | POA: Insufficient documentation

## 2023-07-27 DIAGNOSIS — Z7901 Long term (current) use of anticoagulants: Secondary | ICD-10-CM | POA: Diagnosis not present

## 2023-07-27 DIAGNOSIS — E538 Deficiency of other specified B group vitamins: Secondary | ICD-10-CM | POA: Diagnosis not present

## 2023-07-27 DIAGNOSIS — I4891 Unspecified atrial fibrillation: Secondary | ICD-10-CM | POA: Insufficient documentation

## 2023-07-27 DIAGNOSIS — G629 Polyneuropathy, unspecified: Secondary | ICD-10-CM | POA: Insufficient documentation

## 2023-07-27 DIAGNOSIS — Z5112 Encounter for antineoplastic immunotherapy: Secondary | ICD-10-CM | POA: Insufficient documentation

## 2023-07-27 DIAGNOSIS — R300 Dysuria: Secondary | ICD-10-CM | POA: Diagnosis not present

## 2023-07-27 DIAGNOSIS — D649 Anemia, unspecified: Secondary | ICD-10-CM | POA: Diagnosis not present

## 2023-07-27 DIAGNOSIS — R350 Frequency of micturition: Secondary | ICD-10-CM | POA: Diagnosis not present

## 2023-07-27 DIAGNOSIS — N390 Urinary tract infection, site not specified: Secondary | ICD-10-CM | POA: Diagnosis not present

## 2023-07-27 LAB — CMP (CANCER CENTER ONLY)
ALT: 17 U/L (ref 0–44)
AST: 16 U/L (ref 15–41)
Albumin: 3.9 g/dL (ref 3.5–5.0)
Alkaline Phosphatase: 80 U/L (ref 38–126)
Anion gap: 9 (ref 5–15)
BUN: 34 mg/dL — ABNORMAL HIGH (ref 8–23)
CO2: 26 mmol/L (ref 22–32)
Calcium: 9.2 mg/dL (ref 8.9–10.3)
Chloride: 96 mmol/L — ABNORMAL LOW (ref 98–111)
Creatinine: 1.17 mg/dL — ABNORMAL HIGH (ref 0.44–1.00)
GFR, Estimated: 45 mL/min — ABNORMAL LOW (ref >60)
Glucose, Bld: 107 mg/dL — ABNORMAL HIGH (ref 70–99)
Potassium: 4.3 mmol/L (ref 3.5–5.1)
Sodium: 131 mmol/L — ABNORMAL LOW (ref 135–145)
Total Bilirubin: 0.6 mg/dL (ref 0.3–1.2)
Total Protein: 6 g/dL — ABNORMAL LOW (ref 6.5–8.1)

## 2023-07-27 LAB — CBC WITH DIFFERENTIAL (CANCER CENTER ONLY)
Abs Immature Granulocytes: 0.02 10*3/uL (ref 0.00–0.07)
Basophils Absolute: 0.1 10*3/uL (ref 0.0–0.1)
Basophils Relative: 1 %
Eosinophils Absolute: 0.2 10*3/uL (ref 0.0–0.5)
Eosinophils Relative: 3 %
HCT: 43.9 % (ref 36.0–46.0)
Hemoglobin: 14.7 g/dL (ref 12.0–15.0)
Immature Granulocytes: 0 %
Lymphocytes Relative: 7 %
Lymphs Abs: 0.4 10*3/uL — ABNORMAL LOW (ref 0.7–4.0)
MCH: 29.8 pg (ref 26.0–34.0)
MCHC: 33.5 g/dL (ref 30.0–36.0)
MCV: 89 fL (ref 80.0–100.0)
Monocytes Absolute: 0.9 10*3/uL (ref 0.1–1.0)
Monocytes Relative: 16 %
Neutro Abs: 4 10*3/uL (ref 1.7–7.7)
Neutrophils Relative %: 73 %
Platelet Count: 85 10*3/uL — ABNORMAL LOW (ref 150–400)
RBC: 4.93 MIL/uL (ref 3.87–5.11)
RDW: 17.9 % — ABNORMAL HIGH (ref 11.5–15.5)
WBC Count: 5.5 10*3/uL (ref 4.0–10.5)
nRBC: 0 % (ref 0.0–0.2)

## 2023-07-27 LAB — LACTATE DEHYDROGENASE: LDH: 220 U/L — ABNORMAL HIGH (ref 98–192)

## 2023-07-27 MED ORDER — ACETAMINOPHEN 325 MG PO TABS
650.0000 mg | ORAL_TABLET | Freq: Once | ORAL | Status: AC
  Administered 2023-07-27: 650 mg via ORAL
  Filled 2023-07-27: qty 2

## 2023-07-27 MED ORDER — SODIUM CHLORIDE 0.9 % IV SOLN: Freq: Once | INTRAVENOUS | Status: AC

## 2023-07-27 MED ORDER — DARATUMUMAB-HYALURONIDASE-FIHJ 1800-30000 MG-UT/15ML ~~LOC~~ SOLN
1800.0000 mg | Freq: Once | SUBCUTANEOUS | Status: AC
  Administered 2023-07-27: 1800 mg via SUBCUTANEOUS
  Filled 2023-07-27: qty 15

## 2023-07-27 MED ORDER — LORATADINE 10 MG PO TABS
10.0000 mg | ORAL_TABLET | Freq: Once | ORAL | Status: AC
  Administered 2023-07-27: 10 mg via ORAL
  Filled 2023-07-27: qty 1

## 2023-07-27 NOTE — Progress Notes (Signed)
Ok to treat with platelets of 85 per Dr. Leonides Schanz  Patient to dexamethasone at home this morning.

## 2023-07-27 NOTE — Patient Instructions (Signed)
[facility]  Discharge Instructions: Thank you for choosing [facility] to provide your oncology and hematology care.   If you have a lab appointment with the Cancer Center, please go directly to the Cancer Center and check in at the registration area.   Wear comfortable clothing and clothing appropriate for easy access to any Portacath or PICC line.   We strive to give you quality time with your provider. You may need to reschedule your appointment if you arrive late (15 or more minutes).  Arriving late affects you and other patients whose appointments are after yours.  Also, if you miss three or more appointments without notifying the office, you may be dismissed from the clinic at the provider's discretion.      For prescription refill requests, have your pharmacy contact our office and allow 72 hours for refills to be completed.    Today you received the following chemotherapy and/or immunotherapy agents Darzalex Faspro      To help prevent nausea and vomiting after your treatment, we encourage you to take your nausea medication as directed.  BELOW ARE SYMPTOMS THAT SHOULD BE REPORTED IMMEDIATELY: *FEVER GREATER THAN 100.4 F (38 C) OR HIGHER *CHILLS OR SWEATING *NAUSEA AND VOMITING THAT IS NOT CONTROLLED WITH YOUR NAUSEA MEDICATION *UNUSUAL SHORTNESS OF BREATH *UNUSUAL BRUISING OR BLEEDING *URINARY PROBLEMS (pain or burning when urinating, or frequent urination) *BOWEL PROBLEMS (unusual diarrhea, constipation, pain near the anus) TENDERNESS IN MOUTH AND THROAT WITH OR WITHOUT PRESENCE OF ULCERS (sore throat, sores in mouth, or a toothache) UNUSUAL RASH, SWELLING OR PAIN  UNUSUAL VAGINAL DISCHARGE OR ITCHING   Items with * indicate a potential emergency and should be followed up as soon as possible or go to the Emergency Department if any problems should occur.  Please show the CHEMOTHERAPY ALERT CARD or IMMUNOTHERAPY ALERT CARD at  check-in to the Emergency Department and triage nurse.  Should you have questions after your visit or need to cancel or reschedule your appointment, please contact [facility]  Dept: [phone]  and follow the prompts.  Office hours are 8:00 a.m. to 4:30 p.m. Monday - Friday. Please note that voicemails left after 4:00 p.m. may not be returned until the following business day.  We are closed weekends and major holidays. You have access to a nurse at all times for urgent questions. Please call the main number to the clinic Dept: [phone] and follow the prompts.   For any non-urgent questions, you may also contact your provider using MyChart. We now offer e-Visits for anyone 18 and older to request care online for non-urgent symptoms. For details visit [email or web address].   Also download the MyChart app! Go to the app store, search "MyChart", open the app, select [facility], and log in with your MyChart username and password.  

## 2023-07-31 ENCOUNTER — Encounter: Payer: Self-pay | Admitting: Cardiology

## 2023-07-31 ENCOUNTER — Ambulatory Visit: Payer: Medicare Other | Attending: Cardiology | Admitting: Cardiology

## 2023-07-31 ENCOUNTER — Ambulatory Visit: Payer: Medicare Other

## 2023-07-31 VITALS — BP 110/70 | HR 54 | Resp 16 | Ht 63.0 in | Wt 148.4 lb

## 2023-07-31 DIAGNOSIS — I10 Essential (primary) hypertension: Secondary | ICD-10-CM

## 2023-07-31 DIAGNOSIS — R2689 Other abnormalities of gait and mobility: Secondary | ICD-10-CM | POA: Diagnosis not present

## 2023-07-31 DIAGNOSIS — Z952 Presence of prosthetic heart valve: Secondary | ICD-10-CM

## 2023-07-31 DIAGNOSIS — M6281 Muscle weakness (generalized): Secondary | ICD-10-CM | POA: Diagnosis not present

## 2023-07-31 DIAGNOSIS — I480 Paroxysmal atrial fibrillation: Secondary | ICD-10-CM | POA: Diagnosis not present

## 2023-07-31 DIAGNOSIS — C9000 Multiple myeloma not having achieved remission: Secondary | ICD-10-CM | POA: Diagnosis not present

## 2023-07-31 DIAGNOSIS — I5032 Chronic diastolic (congestive) heart failure: Secondary | ICD-10-CM

## 2023-07-31 DIAGNOSIS — R262 Difficulty in walking, not elsewhere classified: Secondary | ICD-10-CM | POA: Diagnosis not present

## 2023-07-31 NOTE — Patient Instructions (Addendum)
Medication Instructions:  Your physician recommends that you continue on your current medications as directed. Please refer to the Current Medication list given to you today.  *If you need a refill on your cardiac medications before your next appointment, please call your pharmacy*  Lab Work: If you have labs (blood work) drawn today and your tests are completely normal, you will receive your results only by: MyChart Message (if you have MyChart) OR A paper copy in the mail If you have any lab test that is abnormal or we need to change your treatment, we will call you to review the results.  Testing/Procedures: None ordered today.  Follow-Up: At [facility], you and your health needs are our priority.  As part of our continuing mission to provide you with exceptional heart care, we have created designated Provider Care Teams.  These Care Teams include your primary Cardiologist (physician) and Advanced Practice Providers (APPs -  Physician Assistants and Nurse Practitioners) who all work together to provide you with the care you need, when you need it.  We recommend signing up for the patient portal called "MyChart".  Sign up information is provided on this After Visit Summary.  MyChart is used to connect with patients for Virtual Visits (Telemedicine).  Patients are able to view lab/test results, encounter notes, upcoming appointments, etc.  Non-urgent messages can be sent to your provider as well.   To learn more about what you can do with MyChart, go to [email or web address].    Your next appointment:   6 month  Provider:   Yates Decamp, MD

## 2023-07-31 NOTE — Progress Notes (Signed)
Cardiology Office Note:  .   Date:  07/31/2023  ID:  Zoe [name], DOB March 25, 1934, MRN [id number] PCP: Irena Reichmann, DO  [facility] Providers Cardiologist:  Yates Decamp, MD    History of Present Illness: .   Zoe [name] is a 89 y.o. Caucasian female with history of possibly mild coronary spasm by coronary angiogram in 2014,  severe AS s/p TAVR by Dr. Cornelius Moras on 09/03/2018, asymptomatic bilateral carotid artery stenosis,  essential hypertension, chronic stage 3a CKD, mild hyponatremia probably a combination of chronic diastolic heart failure and medications, hyperlipidemia, hyperglycemia and GERD.   Patient has had 2 hospitalizations in May and again in July 2024 with acute decompensated heart failure from A-fib with RVR, last hospitalization with UTI and type II MI on 07/16/2023.  This is a 64-month office visit.  Discussed the use of AI scribe software for clinical note transcription with the patient, who gave verbal consent to proceed.  History of Present Illness   Zoe [name], a patient with a history of atrial fibrillation (AFib) and chronic heart disease, presents with balance issues and suspects she may have a yeast infection and a urinary tract infection (UTI). She was recently in the emergency room for the UTI. She reports no recent episodes of AFib and her blood pressure has been stable, despite some fluctuations. She denies any recent heart racing symptoms. She has not been driving for about a year due to her balance issues and has a caregiver during the day and someone who stays with her at night to prevent falls. She is considering getting a bedside toilet to reduce the cost of nighttime care. She is currently taking half a dose of one of her medications and is not taking another medication she thought was for controlling swelling but is actually for a yeast infection. She is also taking a medication for fluid and heart and blood pressure, which she thought was only for  swelling.        Review of Systems  Cardiovascular:  Negative for chest pain, dyspnea on exertion and leg swelling.    Risk Assessment/Calculations:    CHA2DS2-VASc Score = 5   This indicates a 7.2% annual risk of stroke. The patient's score is based upon: CHF History: 1 HTN History: 1 Diabetes History: 0 Stroke History: 0 Vascular Disease History: 0 Age Score: 2 Gender Score: 1             Lab Results  Component Value Date   CHOL 174 03/30/2023   HDL 59 03/30/2023   LDLCALC 100 (H) 03/30/2023   LDLDIRECT 48 07/16/2019   TRIG 76 03/30/2023   CHOLHDL 2.9 03/30/2023   Lab Results  Component Value Date   NA 131 (L) 07/27/2023   K 4.3 07/27/2023   CO2 26 07/27/2023   GLUCOSE 107 (H) 07/27/2023   BUN 34 (H) 07/27/2023   CREATININE 1.17 (H) 07/27/2023   CALCIUM 9.2 07/27/2023   EGFR 59 (L) 06/19/2022   GFRNONAA 45 (L) 07/27/2023   Lab Results  Component Value Date   WBC 5.5 07/27/2023   HGB 14.7 07/27/2023   HCT 43.9 07/27/2023   MCV 89.0 07/27/2023   PLT 85 (L) 07/27/2023   Physical Exam:   VS:  BP 110/70 (BP Location: Right Arm, Patient Position: Sitting, Cuff Size: Normal)   Pulse (!) 54   Resp 16   Ht 5\' 3"  (1.6 m)   Wt 148 lb 6.4 oz (67.3 kg)   SpO2 95%  BMI 26.29 kg/m    Wt Readings from Last 3 Encounters:  07/31/23 148 lb 6.4 oz (67.3 kg)  07/27/23 149 lb 6.4 oz (67.8 kg)  07/21/23 148 lb (67.1 kg)     Physical Exam Neck:     Vascular: No carotid bruit or JVD.  Cardiovascular:     Rate and Rhythm: Normal rate and regular rhythm.     Pulses: Intact distal pulses.     Heart sounds: Murmur heard.     Early systolic murmur is present with a grade of 2/6 at the upper right sternal border.     No gallop.  Pulmonary:     Effort: Pulmonary effort is normal.     Breath sounds: Normal breath sounds.  Abdominal:     General: Bowel sounds are normal.     Palpations: Abdomen is soft.  Musculoskeletal:     Right lower leg: No edema.     Left  lower leg: No edema.     Studies Reviewed: Marland Kitchen    EKG:    EKG Interpretation Date/Time:  Tuesday July 31 2023 11:48:49 EDT Ventricular Rate:  57 PR Interval:  214 QRS Duration:  80 QT Interval:  448 QTC Calculation: 436 R Axis:   3  Text Interpretation: EKG 07/31/2023: Sinus rhythm with first-degree block at rate of 57 bpm, poor R progression, probably normal variant.  Normal EKG.  Compared to 07/16/2023, no significant change. Confirmed by Delrae Rend [phone]) on 07/31/2023 12:03:08 PM   EKG 05/09/2023: Sinus rhythm with first-degree AV block at the rate of 77 bpm, normal axis, incomplete right bundle branch block.  No evidence of ischemia.  Compared to 03/29/2023, first-degree AV block new.  TAVR 09/03/2018: Edwards Sapien 3 THV (size 23 mm, model # 9600TFX, serial # [id number]) by Ashley Mariner and Earney Hamburg.   Carotid artery duplex 09/21/2022:  Duplex suggests stenosis in the right internal carotid artery (1-15%).  Duplex suggests stenosis in the left internal carotid artery (16-49%).  Antegrade right vertebral artery flow. Antegrade left vertebral artery flow.  Compared to the study done on 02/15/2022, left ICA stenosis of >50% has now regressed.  Otherwise no significant change.  There is mild to moderate heterogenous plaque noted in the bilateral carotid arteries. Follow up in one year is appropriate if clinically indicated.    Echocardiogram 04/16/2023  1. Left ventricular ejection fraction, by estimation, is 60 to 65%. The left ventricle has normal function. The left ventricle has no regional wall motion abnormalities. There is moderate concentric left ventricular hypertrophy. Left ventricular  diastolic parameters are consistent with Grade II diastolic dysfunction (pseudonormalization). Elevated left ventricular end-diastolic pressure.  2. Right ventricular systolic function is normal. The right ventricular size is normal. There is mildly elevated pulmonary artery systolic  pressure. The estimated right ventricular systolic pressure is 41.4 mmHg.  3. Left atrial size was moderately dilated.  4. The mitral valve is normal in structure. Mild mitral valve regurgitation.  5. The aortic valve has been repaired/replaced. Aortic valve regurgitation is mild. No aortic stenosis is present. There is a 23 mm 3 THV size 23 mm Sapien prosthetic (TAVR) valve present in the aortic position. Procedure Date: 09/03/2018.  6. The inferior vena cava is normal in size with <50% respiratory variability, suggesting right atrial pressure of 8 mmHg.   Comparison(s): A prior study was performed on 02/17/2023. No significant change from prior study.   ASSESSMENT AND PLAN: .      ICD-10-CM   1. Paroxysmal  atrial fibrillation (HCC)  I48.0 EKG 12-Lead    2. Chronic diastolic (congestive) heart failure (HCC)  I50.32     3. Primary hypertension  I10     4. TAVR 09/03/2018: Edwards Sapien 3 THV (size 23 mm, model # 9600TFX, serial # [id number])  Z95.2      CHA2DS2-VASc Score = 5 [CHF History: 1, HTN History: 1, Diabetes History: 0, Stroke History: 0, Vascular Disease History: 0, Age Score: 2, Gender Score: 1].  Therefore, the patient's annual risk of stroke is 7.2 %.     Assessment and Plan  1. Paroxysmal atrial fibrillation (HCC) Stable with no recent episodes of AFib or heart racing symptoms. Blood pressure is well-controlled with occasional fluctuations. Medications include Metoprolol Tartrate, Amiodarone, Spironolactone, and Losartan. - EKG 12-Lead  2. Chronic diastolic (congestive) heart failure (HCC) No clinical evidence heart failure, she is presently doing well and feeling the best she has in quite a while.  She does have mild chronic stage III chronic kidney disease and mild chronic hyponatremia probably related to combination of heart failure and losartan and spironolactone.  However she has remained stable hence no changes were done today.  3. Primary hypertension Blood pressure  is well-controlled.  4. TAVR 09/03/2018: Edwards Sapien 3 THV (size 23 mm, model # 9600TFX, serial # [id number]) Normal functioning aortic valve, she is aware that she needs endocarditis prophylaxis.  I will see her back in 6 months for follow-up.  Signed,  Yates Decamp, MD, [facility] 07/31/2023, 12:17 PM

## 2023-07-31 NOTE — Therapy (Signed)
OUTPATIENT PHYSICAL THERAPY  LOWER EXTREMITY ONCOLOGY TREATMENT  Patient Name: Zoe [name] MRN: [id number] DOB:08-19-1934, 89 y.o., female Today's Date: 07/31/2023  END OF SESSION:  PT End of Session - 07/31/23 1000     Visit Number 4    Number of Visits 12    Date for PT Re-Evaluation 08/20/23    PT Start Time 1002    PT Stop Time 1047    PT Time Calculation (min) 45 min    Equipment Utilized During Treatment Gait belt    Activity Tolerance Patient tolerated treatment well;Patient limited by fatigue    Behavior During Therapy WFL for tasks assessed/performed             Past Medical History:  Diagnosis Date   Arthritis    some - per patient   Breast cancer (HCC)    breast cancer / left    Cataract    bilat    GERD (gastroesophageal reflux disease)    History of kidney stones    Hyperlipidemia    Hypertension    Hypothyroidism    Macular degeneration    Left   S/P TAVR (transcatheter aortic valve replacement) 09/03/2018   23 mm Edwards Sapien 3 transcatheter heart valve placed via percutaneous right transfemoral approach    Severe aortic stenosis    Stress incontinence    Thyroid disease    Tinnitus    Past Surgical History:  Procedure Laterality Date   ABDOMINAL HYSTERECTOMY  1970's   BACK SURGERY     BREAST LUMPECTOMY  12/1998   lumpectomy   CARDIAC CATHETERIZATION     CARDIOVERSION N/A 04/18/2023   Procedure: CARDIOVERSION;  Surgeon: Tessa Lerner, DO;  Location: [facility] INVASIVE CV LAB;  Service: Cardiovascular;  Laterality: N/A;   CARDIOVERSION N/A 04/20/2023   Procedure: CARDIOVERSION;  Surgeon: Yates Decamp, MD;  Location: [facility] INVASIVE CV LAB;  Service: Cardiovascular;  Laterality: N/A;   EYE SURGERY     cataract surgery bilat    INTRAOPERATIVE TRANSTHORACIC ECHOCARDIOGRAM N/A 09/03/2018   Procedure: INTRAOPERATIVE TRANSTHORACIC ECHOCARDIOGRAM;  Surgeon: Kathleene Hazel, MD;  Location: [facility] OR;  Service: Open Heart Surgery;  Laterality: N/A;    KYPHOPLASTY N/A 09/07/2022   Procedure: THORACIC EIGHT KYPHOPLASTY;  Surgeon: Venita Lick, MD;  Location: [facility] OR;  Service: Orthopedics;  Laterality: N/A;  1 hr Local with IV Regional 3 C-Bed   LITHOTRIPSY     Right total knee     2018 Dr. Charlann Boxer   RIGHT/LEFT HEART CATH AND CORONARY ANGIOGRAPHY N/A 08/06/2018   Procedure: RIGHT/LEFT HEART CATH AND CORONARY ANGIOGRAPHY;  Surgeon: Yates Decamp, MD;  Location: [facility] INVASIVE CV LAB;  Service: Cardiovascular;  Laterality: N/A;   THYROIDECTOMY, PARTIAL  1975   TONSILLECTOMY     as a child - patient not sure of exact date   TOTAL KNEE ARTHROPLASTY Left 03/13/2016   Procedure: TOTAL KNEE ARTHROPLASTY;  Surgeon: Durene Romans, MD;  Location: [facility] ORS;  Service: Orthopedics;  Laterality: Left;   TOTAL KNEE ARTHROPLASTY Right 06/18/2017   Procedure: RIGHT TOTAL KNEE ARTHROPLASTY;  Surgeon: Durene Romans, MD;  Location: [facility] ORS;  Service: Orthopedics;  Laterality: Right;   TRANSCATHETER AORTIC VALVE REPLACEMENT, TRANSFEMORAL N/A 09/03/2018   Procedure: TRANSCATHETER AORTIC VALVE REPLACEMENT, TRANSFEMORAL;  Surgeon: Kathleene Hazel, MD;  Location: [facility] OR;  Service: Open Heart Surgery;  Laterality: N/A;   Patient Active Problem List   Diagnosis Date Noted   Hypertensive urgency 07/16/2023   Urinary tract infection due to Klebsiella species  07/16/2023   Heart failure with preserved ejection fraction (HCC) 07/16/2023   Hypokalemia 07/16/2023   Hypocalcemia 07/16/2023   Atypical atrial flutter (HCC) 04/19/2023   Acute on chronic diastolic heart failure (HCC) 04/19/2023   Atrial fibrillation with rapid ventricular response (HCC) 04/18/2023   History of stroke 04/16/2023   Fall at home, initial encounter 04/16/2023   Atrial fibrillation (HCC) 04/16/2023   Paroxysmal atrial fibrillation (HCC) 04/15/2023   Elevated troponin 04/15/2023   Chest pain 04/13/2023   Stroke due to embolism (HCC) 03/20/2023   Chest pain, rule out acute myocardial infarction  02/17/2023   Chemotherapy-induced neuropathy (HCC) 11/28/2022   Pain due to onychomycosis of toenails of both feet 11/08/2022   Multiple myeloma not having achieved remission (HCC) 07/24/2022   Chronic diastolic CHF (congestive heart failure) (HCC) 05/16/2022   Symptomatic anemia 05/16/2022   Hyponatremia 04/02/2022   Microcytic anemia 04/02/2022   S/P TAVR (transcatheter aortic valve replacement) 09/03/2018   Hypothyroidism    Hypertension    Hyperlipidemia    GERD (gastroesophageal reflux disease)    S/P right TKA 06/18/2017   Class 1 obesity 03/15/2016   S/P left TKA 03/13/2016   History of breast cancer, DCIS, lumpectomy March 2000 12/13/2011    PCP: Dr. Irena Reichmann  REFERRING PROVIDER: Dr. Jeanie Sewer, IV  REFERRING DIAG: Multiple Myeloma  THERAPY DIAG:  Multiple myeloma without remission (HCC)  Muscle weakness (generalized)  Difficulty walking  Balance problem  ONSET DATE: 07/2022  Rationale for Evaluation and Treatment: Rehabilitation  SUBJECTIVE:                                                                                                                                                                                           SUBJECTIVE STATEMENT:  I did oK last visit. I wasn't that sore. I have done my exercises a few times. I still feel woozy at times, even when I sit sometimes  eval I need to get my balance better so I can move without fear of falling. Difficult getting up and down from chairs, Some fatigue with walking short distances;ie car into clinic. Reports some foot numbness if she is cold but feels good with socks on. Her grandson lives with her, but she has a home health aid in the day time and the night time for safety PERTINENT HISTORY:  Pt was diagnosed with  IgGMultiple Myeloma in 07/2022 after a bone Marrow biopsy showed 60% plasma cells and anemia. No lytic lesions. She was started on chemotherapy. She had surgery for T 8 compression  fracture kyphoplasty on 09/07/2022. She was hospitalized 04/15/2023 for Atrial Fibrillation with cardioversion  attempted on 04/18/2023. She had some PT at that time. She has a home health aide that drives her to appts and helps with keeping her house clean And someone at night to help her get to the bathroom  PMH significant for A-fib on Eliquis and amiodarone, hypothyroidism, HTN, breast cancer, multiple myeloma,aortic stenosis status post TAVR, hx of TIA, Macular degeneration, Head aches she thinks related to her eyes PAIN:  Are you having pain? No, just woozy feeling  PRECAUTIONS: PMH significant of A-fib on Eliquis and amiodarone, hypothyroidism, HTN, Left breast cancer, multiple myeloma,aortic stenosis status post TAVR, hx of TIA, T8 compression fx with kyphoplasty  RED FLAGS: Bowel or bladder incontinence: Yes:   and Compression fracture: Yes: had a kyphoplasty    WEIGHT BEARING RESTRICTIONS: No  FALLS:  Has patient fallen in last 6 months? Yes. Number of falls 1, fell in community garden: no injury  LIVING ENVIRONMENT: Lives with: Lucila Maine lives with her presently Lives in: House/apartment Stairs: No;  Has following equipment at home: Single point cane, Environmental consultant - 4 wheeled, and shower chair  OCCUPATION: Retired  LEISURE: read,visit with friends  PRIOR LEVEL OF FUNCTION: Independent with basic ADLs, Independent with household mobility with device, and Needs assistance with homemaking  PATIENT GOALS: Be able to walk more securely, not be dependent on a walker/rollator   OBJECTIVE:  COGNITION: Overall cognitive status: deficits  PALPATION:   OBSERVATIONS / OTHER ASSESSMENTS: Ambulates with rollator, flexed posture/increased kyphosis  SENSATION: Light touch: deficits   POSTURE: forward head, rounded shoulders, increased thoracic kyphosis  LOWER EXTREMITY STRENGTH:  MMT Right eval  Hip flexion 4-  Hip extension Able to bridge  Hip abduction 4-  Hip adduction 4  -seated  Hip internal rotation 3-  Hip external rotation 3+  Knee flexion 4+  Knee extension 4+  Ankle dorsiflexion 5  Ankle plantarflexion   Ankle inversion   Ankle eversion   Great toe extension    (Blank rows = not tested)  MMT LEFT eval  Hip flexion 4  Hip extension Able to bridge  Hip abduction 4-  Hip adduction 4-  Hip internal rotation 3+  Hip external rotation 3  Knee flexion 4+  Knee extension 4  Ankle dorsiflexion 5  Ankle plantarflexion   Ankle inversion   Ankle eversion   Great toe extension     (Blank rows = not tested)  LYMPHEDEMA ASSESSMENTS:   SURGERY TYPE/DATE: Left lumpectomy 2000  NUMBER OF LYMPH NODES REMOVED: Thinks so, not sure how many  CHEMOTHERAPY: Yes for Multiple Myeloma, No for Breast Cancer  RADIATION:No for Breast Cancer  HORMONE TREATMENT: Yes for Breast Cancer  INFECTIONS: NO  LYMPHEDEMA ASSESSMENTS:   LOWER EXTREMITY LANDMARK RIGHT eval  At groin   30 cm proximal to suprapatella   20 cm proximal to suprapatella   10 cm proximal to suprapatella   At midpatella / popliteal crease   30 cm proximal to floor at lateral plantar foot   20 cm proximal to floor at lateral plantar foot   10 cm proximal to floor at lateral plantar foot   Circumference of ankle/heel   5 cm proximal to 1st MTP joint   Across MTP joint   Around proximal great toe   (Blank rows = not tested)  LOWER EXTREMITY LANDMARK LEFT eval  At groin   30 cm proximal to suprapatella   20 cm proximal to suprapatella   10 cm proximal to suprapatella   At midpatella / popliteal crease  30 cm proximal to floor at lateral plantar foot   20 cm proximal to floor at lateral plantar foot   10 cm proximal to floor at lateral plantar foot   Circumference of ankle/heel   5 cm proximal to 1st MTP joint   Across MTP joint   Around proximal great toe   (Blank rows = not tested)  FUNCTIONAL TESTS:  30 seconds sit to stand test: 5 repetitions with use of UE's  Timed  up and go: 30.4 seconds with rollator and use of hands to rise from chair  Four position balance test:  able to maintain DLS feet together x 10 sec, and half tandem with left foot forward x 10 sec. , unable to hold half tandem with right foot forward for greater than 3 seconds, unable to maintain tandem stance bilaterally for more than a few seconds. Did not test SLS GAIT: Distance walked: Nustep to ramp in front of building Assistive device utilized: Rollator Level of assistance: Modified independence Comments:flexed posture, increased kyphosis    Outcome measure:  TODAY'S TREATMENT:                                                                                                                                          DATE:   07/31/2023 O2 99%, HR 66, BP 117/66 Nu Step;Seat 8, UE 8, Lev 2 x 5 min 334 steps sit to stand from chair with purple cushion 2 x 5, 1 x 4 without cushion with min A PT and VC's for lift In parallel bars: marching with HH x 10, without HH x 10 CGA of PT Heel raises x 10 with HH, x 10 no HH, CGA of PT Semi tandem and tandem stance x 2-3 reps each, CGA seated rest Purple ball squeeze x 10 5 sec hold; emphasis on erect posture Scapular retraction with yellow band x 10 Ambulated from parallel bars to end of ortho gym and return with rollator with emphasis on erect posture, SBA     07/26/2023 Took pts blood pressure 124/60 O2 97, HR 53,  prior to rx Nu Step;Seat 8, UE 8, Lev 2 x 2:30 x 2 reps with rest between sets HR 63 after 2:30 seconds, after seated rest HR 53, O2 100% Heel raises x 15 seated Toe raises x 15 Marching x 15 seated Seated LAQ  2x 10 alternating with 2# B Ball squeeze x 10 Updated HEP with pictures;gave large copy due to Macular degeneration Treatment decreased secondary to pt feeling a little dizzy and fatigued due to sickness the last few weeks and new meds.     07/12/2023 Assessed MMT  B Sit to stand from slightly higher mat table 2 x  5 with emphasis on control and form without use of hands Nu Step;Seat 7, UE 8, Lev 2 x 5 min, 238 steps Heel raises x 10 with HH in bars Marching  x 10 with HH, in bars Hip abd x 10 B, HH Seated LAQ  2x 10 alternating with 2# B Ball squeeze x 10 Bridging 2 x 5 Piriformis stretch x 2 ea 20 sec supine Updated HEP with pictures;gave large copy due to Macular degeneration  EVAL Discussed POC and treatment interventions including, strength, postural education, balance and function   PATIENT EDUCATION:  Access Code: [email or web address] URL: [email or web address] Date: 07/12/2023 Prepared by: Alvira Monday  Exercises - Supine Bridge  - 1 x daily - 7 x weekly - 1 sets - 10 reps - Seated Long Arc Quad  - 1 x daily - 7 x weekly - 1 sets - 10 reps - Standing Hip Abduction with Counter Support  - 1 x daily - 7 x weekly - 1 sets - 10 reps - Seated March  - 1 x daily - 7 x weekly - 1 sets - 10 reps - Standing March with Counter Support  - 1 x daily - 7 x weekly - 1 sets - 10 reps - Standing Heel Raise  - 1 x daily - 7 x weekly - 1 sets - 10 reps - Seated Hip Adduction Isometrics with Ball  - 1 x daily - 7 x weekly - 1 sets - 10 reps - 5 hold Education details: Discussed POC and treatment interventions including, strength, balance and function Person educated: Patient Education method: Explanation Education comprehension: verbalized understanding  HOME EXERCISE PROGRAM:  Supine Bridge  - 1 x daily - 7 x weekly - 1 sets - 10 reps - Seated Long Arc Quad  - 1 x daily - 7 x weekly - 1 sets - 10 reps - Standing Hip Abduction with Counter Support  - 1 x daily - 7 x weekly - 1 sets - 10 reps - Seated March  - 1 x daily - 7 x weekly - 1 sets - 10 reps - Standing March with Counter Support  - 1 x daily - 7 x weekly - 1 sets - 10 reps - Standing Heel Raise  - 1 x daily - 7 x weekly - 1 sets - 10 reps - Seated Hip Adduction Isometrics with Ball  - 1 x daily - 7 x weekly - 1 sets - 10 reps - 5  hold  ASSESSMENT:  CLINICAL IMPRESSION: Pt was able to progress all activities in therapy including strength, balance and function. She was able to perform sit to stand without UE's with purple cushion in chair, but needed some assist of PT without  cushion in chair. She was able to maintain semi tandem stance for greater than 15 seconds, but had difficulty with tandem stance. Pts legs fatigued after therapy and mildly sore.   OBJECTIVE IMPAIRMENTS: decreased activity tolerance, decreased balance, decreased cognition, decreased endurance, decreased knowledge of condition, difficulty walking, decreased strength, impaired sensation, impaired vision/preception, postural dysfunction, and pain.   ACTIVITY LIMITATIONS: carrying, lifting, standing, squatting, bed mobility, dressing, and locomotion level  PARTICIPATION LIMITATIONS: meal prep, cleaning, laundry, driving, shopping, and community activity  PERSONAL FACTORS: Age and 3+ comorbidities: multiple myeloma,Macular Degeneration , Afib are also affecting patient's functional outcome.   REHAB POTENTIAL: Good  CLINICAL DECISION MAKING: Evolving/moderate complexity  EVALUATION COMPLEXITY: Low   GOALS: Goals reviewed with patient? Yes  SHORT TERM GOALS: Target date: 07/30/2023  Pt will be independent in a HEP for LE/core strength Baseline: Goal status: INITIAL  2.  Pt will be able to maintain half tandem stance bilaterally x 10 sec Baseline:  Goal status: INITIAL  3.  Pt will perform 6-7 sit to stands in 30 seconds to decrease fall risk Baseline:  Goal status: INITIAL  4.  Pt will perform TUG in 26 seconds to decrease fall risk Baseline:  Goal status: INITIAL   LONG TERM GOALS: Target date: 08/20/2023  Pt will be able to maintain tandem stance for 10 seconds B to demonstrate improved balance Baseline:  Goal status: INITIAL  2.  Pt will be able to perform 9 sit to stands in 30 seconds for average score to decrease fall  risk Baseline:  Goal status: INITIAL  3.  Pt will be able to perform TUG with rollator in 24 seconds or less for improved gait speed and decreased fall risk Baseline:  Goal status: INITIAL  4.  Pt will be independent in more advanced HEP Baseline:  Goal status: INITIAL   PLAN:  PT FREQUENCY: 2x/week  PT DURATION: 6 weeks  PLANNED INTERVENTIONS: Therapeutic exercises, Therapeutic activity, Neuromuscular re-education, Balance training, Gait training, Patient/Family education, Self Care, and Manual therapy  PLAN FOR NEXT SESSION:  , progress  strength,balance, function, gait   Waynette Buttery, PT 07/31/2023, 10:51 AM

## 2023-08-02 ENCOUNTER — Ambulatory Visit: Payer: Medicare Other

## 2023-08-02 DIAGNOSIS — R262 Difficulty in walking, not elsewhere classified: Secondary | ICD-10-CM

## 2023-08-02 DIAGNOSIS — R2689 Other abnormalities of gait and mobility: Secondary | ICD-10-CM

## 2023-08-02 DIAGNOSIS — M6281 Muscle weakness (generalized): Secondary | ICD-10-CM

## 2023-08-02 DIAGNOSIS — C9000 Multiple myeloma not having achieved remission: Secondary | ICD-10-CM

## 2023-08-02 NOTE — Therapy (Signed)
OUTPATIENT PHYSICAL THERAPY  LOWER EXTREMITY ONCOLOGY TREATMENT  Patient Name: Zoe [name] MRN: [id number] DOB:12-Dec-1933, 89 y.o., female Today's Date: 08/02/2023  END OF SESSION:  PT End of Session - 08/02/23 0951     Visit Number 5    Number of Visits 12    Date for PT Re-Evaluation 08/20/23    PT Start Time 0957    PT Stop Time 1045    PT Time Calculation (min) 48 min    Equipment Utilized During Treatment Gait belt    Activity Tolerance Patient tolerated treatment well;Patient limited by fatigue    Behavior During Therapy WFL for tasks assessed/performed             Past Medical History:  Diagnosis Date   Arthritis    some - per patient   Breast cancer (HCC)    breast cancer / left    Cataract    bilat    GERD (gastroesophageal reflux disease)    History of kidney stones    Hyperlipidemia    Hypertension    Hypothyroidism    Macular degeneration    Left   S/P TAVR (transcatheter aortic valve replacement) 09/03/2018   23 mm Edwards Sapien 3 transcatheter heart valve placed via percutaneous right transfemoral approach    Severe aortic stenosis    Stress incontinence    Thyroid disease    Tinnitus    Past Surgical History:  Procedure Laterality Date   ABDOMINAL HYSTERECTOMY  1970's   BACK SURGERY     BREAST LUMPECTOMY  12/1998   lumpectomy   CARDIAC CATHETERIZATION     CARDIOVERSION N/A 04/18/2023   Procedure: CARDIOVERSION;  Surgeon: Tessa Lerner, DO;  Location: [facility] INVASIVE CV LAB;  Service: Cardiovascular;  Laterality: N/A;   CARDIOVERSION N/A 04/20/2023   Procedure: CARDIOVERSION;  Surgeon: Yates Decamp, MD;  Location: [facility] INVASIVE CV LAB;  Service: Cardiovascular;  Laterality: N/A;   EYE SURGERY     cataract surgery bilat    INTRAOPERATIVE TRANSTHORACIC ECHOCARDIOGRAM N/A 09/03/2018   Procedure: INTRAOPERATIVE TRANSTHORACIC ECHOCARDIOGRAM;  Surgeon: Kathleene Hazel, MD;  Location: [facility] OR;  Service: Open Heart Surgery;  Laterality: N/A;    KYPHOPLASTY N/A 09/07/2022   Procedure: THORACIC EIGHT KYPHOPLASTY;  Surgeon: Venita Lick, MD;  Location: [facility] OR;  Service: Orthopedics;  Laterality: N/A;  1 hr Local with [facility] C-Bed   LITHOTRIPSY     Right total knee     2018 Dr. Charlann Boxer   RIGHT/LEFT HEART CATH AND CORONARY ANGIOGRAPHY N/A 08/06/2018   Procedure: RIGHT/LEFT HEART CATH AND CORONARY ANGIOGRAPHY;  Surgeon: Yates Decamp, MD;  Location: [facility] INVASIVE CV LAB;  Service: Cardiovascular;  Laterality: N/A;   THYROIDECTOMY, PARTIAL  1975   TONSILLECTOMY     as a child - patient not sure of exact date   TOTAL KNEE ARTHROPLASTY Left 03/13/2016   Procedure: TOTAL KNEE ARTHROPLASTY;  Surgeon: Durene Romans, MD;  Location: [facility] ORS;  Service: Orthopedics;  Laterality: Left;   TOTAL KNEE ARTHROPLASTY Right 06/18/2017   Procedure: RIGHT TOTAL KNEE ARTHROPLASTY;  Surgeon: Durene Romans, MD;  Location: [facility] ORS;  Service: Orthopedics;  Laterality: Right;   TRANSCATHETER AORTIC VALVE REPLACEMENT, TRANSFEMORAL N/A 09/03/2018   Procedure: TRANSCATHETER AORTIC VALVE REPLACEMENT, TRANSFEMORAL;  Surgeon: Kathleene Hazel, MD;  Location: [facility] OR;  Service: Open Heart Surgery;  Laterality: N/A;   Patient Active Problem List   Diagnosis Date Noted   Hypertensive urgency 07/16/2023   Urinary tract infection due to Klebsiella species  07/16/2023   Heart failure with preserved ejection fraction (HCC) 07/16/2023   Hypokalemia 07/16/2023   Hypocalcemia 07/16/2023   Atypical atrial flutter (HCC) 04/19/2023   Acute on chronic diastolic heart failure (HCC) 04/19/2023   Atrial fibrillation with rapid ventricular response (HCC) 04/18/2023   History of stroke 04/16/2023   Fall at home, initial encounter 04/16/2023   Atrial fibrillation (HCC) 04/16/2023   Paroxysmal atrial fibrillation (HCC) 04/15/2023   Elevated troponin 04/15/2023   Chest pain 04/13/2023   Stroke due to embolism (HCC) 03/20/2023   Chest pain, rule out acute myocardial infarction  02/17/2023   Chemotherapy-induced neuropathy (HCC) 11/28/2022   Pain due to onychomycosis of toenails of both feet 11/08/2022   Multiple myeloma not having achieved remission (HCC) 07/24/2022   Chronic diastolic CHF (congestive heart failure) (HCC) 05/16/2022   Symptomatic anemia 05/16/2022   Hyponatremia 04/02/2022   Microcytic anemia 04/02/2022   S/P TAVR (transcatheter aortic valve replacement) 09/03/2018   Hypothyroidism    Hypertension    Hyperlipidemia    GERD (gastroesophageal reflux disease)    S/P right TKA 06/18/2017   Class 1 obesity 03/15/2016   S/P left TKA 03/13/2016   History of breast cancer, DCIS, lumpectomy March 2000 12/13/2011    PCP: Dr. Irena Reichmann  REFERRING PROVIDER: Dr. Jeanie Sewer, IV  REFERRING DIAG: Multiple Myeloma  THERAPY DIAG:  Multiple myeloma without remission (HCC)  Muscle weakness (generalized)  Difficulty walking  Balance problem  ONSET DATE: 07/2022  Rationale for Evaluation and Treatment: Rehabilitation  SUBJECTIVE:                                                                                                                                                                                           SUBJECTIVE STATEMENT:  I did fine after last visit but my legs were a little sore. I didn't practice getting up and down, but I did do my exercises. My yeast infection has returned but I have not been able to get in touch with my MD .  eval I need to get my balance better so I can move without fear of falling. Difficult getting up and down from chairs, Some fatigue with walking short distances;ie car into clinic. Reports some foot numbness if she is cold but feels good with socks on. Her grandson lives with her, but she has a home health aid in the day time and the night time for safety PERTINENT HISTORY:  Pt was diagnosed with  IgGMultiple Myeloma in 07/2022 after a bone Marrow biopsy showed 60% plasma cells and anemia. No lytic  lesions. She was started on chemotherapy. She had surgery  for T 8 compression fracture kyphoplasty on 09/07/2022. She was hospitalized 04/15/2023 for Atrial Fibrillation with cardioversion attempted on 04/18/2023. She had some PT at that time. She has a home health aide that drives her to appts and helps with keeping her house clean And someone at night to help her get to the bathroom  PMH significant for A-fib on Eliquis and amiodarone, hypothyroidism, HTN, breast cancer, multiple myeloma,aortic stenosis status post TAVR, hx of TIA, Macular degeneration, Head aches she thinks related to her eyes PAIN:  Are you having pain? No, just  a little woozy feeling  PRECAUTIONS: PMH significant of A-fib on Eliquis and amiodarone, hypothyroidism, HTN, Left breast cancer, multiple myeloma,aortic stenosis status post TAVR, hx of TIA, T8 compression fx with kyphoplasty  RED FLAGS: Bowel or bladder incontinence: Yes:   and Compression fracture: Yes: had a kyphoplasty    WEIGHT BEARING RESTRICTIONS: No  FALLS:  Has patient fallen in last 6 months? Yes. Number of falls 1, fell in community garden: no injury  LIVING ENVIRONMENT: Lives with: Lucila Maine lives with her presently Lives in: House/apartment Stairs: No;  Has following equipment at home: Single point cane, Environmental consultant - 4 wheeled, and shower chair  OCCUPATION: Retired  LEISURE: read,visit with friends  PRIOR LEVEL OF FUNCTION: Independent with basic ADLs, Independent with household mobility with device, and Needs assistance with homemaking  PATIENT GOALS: Be able to walk more securely, not be dependent on a walker/rollator   OBJECTIVE:  COGNITION: Overall cognitive status: deficits  PALPATION:   OBSERVATIONS / OTHER ASSESSMENTS: Ambulates with rollator, flexed posture/increased kyphosis  SENSATION: Light touch: deficits   POSTURE: forward head, rounded shoulders, increased thoracic kyphosis  LOWER EXTREMITY STRENGTH:  MMT Right eval   Hip flexion 4-  Hip extension Able to bridge  Hip abduction 4-  Hip adduction 4 -seated  Hip internal rotation 3-  Hip external rotation 3+  Knee flexion 4+  Knee extension 4+  Ankle dorsiflexion 5  Ankle plantarflexion   Ankle inversion   Ankle eversion   Great toe extension    (Blank rows = not tested)  MMT LEFT eval  Hip flexion 4  Hip extension Able to bridge  Hip abduction 4-  Hip adduction 4-  Hip internal rotation 3+  Hip external rotation 3  Knee flexion 4+  Knee extension 4  Ankle dorsiflexion 5  Ankle plantarflexion   Ankle inversion   Ankle eversion   Great toe extension     (Blank rows = not tested)  LYMPHEDEMA ASSESSMENTS:   SURGERY TYPE/DATE: Left lumpectomy 2000  NUMBER OF LYMPH NODES REMOVED: Thinks so, not sure how many  CHEMOTHERAPY: Yes for Multiple Myeloma, No for Breast Cancer  RADIATION:No for Breast Cancer  HORMONE TREATMENT: Yes for Breast Cancer  INFECTIONS: NO  LYMPHEDEMA ASSESSMENTS:   LOWER EXTREMITY LANDMARK RIGHT eval  At groin   30 cm proximal to suprapatella   20 cm proximal to suprapatella   10 cm proximal to suprapatella   At midpatella / popliteal crease   30 cm proximal to floor at lateral plantar foot   20 cm proximal to floor at lateral plantar foot   10 cm proximal to floor at lateral plantar foot   Circumference of ankle/heel   5 cm proximal to 1st MTP joint   Across MTP joint   Around proximal great toe   (Blank rows = not tested)  LOWER EXTREMITY LANDMARK LEFT eval  At groin   30 cm proximal to suprapatella  20 cm proximal to suprapatella   10 cm proximal to suprapatella   At midpatella / popliteal crease   30 cm proximal to floor at lateral plantar foot   20 cm proximal to floor at lateral plantar foot   10 cm proximal to floor at lateral plantar foot   Circumference of ankle/heel   5 cm proximal to 1st MTP joint   Across MTP joint   Around proximal great toe   (Blank rows = not  tested)  FUNCTIONAL TESTS:  30 seconds sit to stand test: 5 repetitions with use of UE's  Timed up and go: 30.4 seconds with rollator and use of hands to rise from chair  Four position balance test:  able to maintain DLS feet together x 10 sec, and half tandem with left foot forward x 10 sec. , unable to hold half tandem with right foot forward for greater than 3 seconds, unable to maintain tandem stance bilaterally for more than a few seconds. Did not test SLS GAIT: Distance walked: Nustep to ramp in front of building Assistive device utilized: Rollator Level of assistance: Modified independence Comments:flexed posture, increased kyphosis    Outcome measure:  TODAY'S TREATMENT:                                                                                                                                          DATE:   08/02/2023 O2 99%, HR 60 Nu Step;Seat 8, UE 8, Lev 2 x 6 min 399steps In parallel bars: marching with HH x 10, with intermittent HH x 10 CGA of PT Heel raises x 10 with HH, x 10 no HH, CGA of PT Semi tandem and tandem stance x 2-3 reps each, CGA seated rest Sit to stand with purple pad on chair 2 x 5 LAQ 2 x 10 B with 2# Ambulated from parallel bars  cancer end to end of ortho gym; brief rest O2 96%, HR 59 then returned back to parallel bars on Cancer side. Seated rest  07/31/2023 O2 99%, HR 66, BP 117/66 Nu Step;Seat 8, UE 8, Lev 2 x 5 min 334 steps sit to stand from chair with purple cushion 2 x 5, 1 x 4 without cushion with min A PT and VC's for lift In parallel bars: marching with HH x 10, without HH x 10 CGA of PT Heel raises x 10 with HH, x 10 no HH, CGA of PT Semi tandem and tandem stance x 2-3 reps each, CGA seated rest Purple ball squeeze x 10 5 sec hold; emphasis on erect posture Scapular retraction with yellow band x 10 Ambulated from parallel bars to end of ortho gym and return with rollator with emphasis on erect posture,  SBA     07/26/2023 Took pts blood pressure 124/60 O2 97, HR 53,  prior to rx Nu Step;Seat 8, UE 8, Lev 2 x 2:30 x  2 reps with rest between sets HR 63 after 2:30 seconds, after seated rest HR 53, O2 100% Heel raises x 15 seated Toe raises x 15 Marching x 15 seated Seated LAQ  2x 10 alternating with 2# B Ball squeeze x 10 Updated HEP with pictures;gave large copy due to Macular degeneration Treatment decreased secondary to pt feeling a little dizzy and fatigued due to sickness the last few weeks and new meds.     07/12/2023 Assessed MMT  B Sit to stand from slightly higher mat table 2 x 5 with emphasis on control and form without use of hands Nu Step;Seat 7, UE 8, Lev 2 x 5 min, 238 steps Heel raises x 10 with HH in bars Marching x 10 with HH, in bars Hip abd x 10 B, HH Seated LAQ  2x 10 alternating with 2# B Ball squeeze x 10 Bridging 2 x 5 Piriformis stretch x 2 ea 20 sec supine Updated HEP with pictures;gave large copy due to Macular degeneration  EVAL Discussed POC and treatment interventions including, strength, postural education, balance and function   PATIENT EDUCATION:  Access Code: [email or web address] URL: [email or web address] Date: 07/12/2023 Prepared by: Alvira Monday  Exercises - Supine Bridge  - 1 x daily - 7 x weekly - 1 sets - 10 reps - Seated Long Arc Quad  - 1 x daily - 7 x weekly - 1 sets - 10 reps - Standing Hip Abduction with Counter Support  - 1 x daily - 7 x weekly - 1 sets - 10 reps - Seated March  - 1 x daily - 7 x weekly - 1 sets - 10 reps - Standing March with Counter Support  - 1 x daily - 7 x weekly - 1 sets - 10 reps - Standing Heel Raise  - 1 x daily - 7 x weekly - 1 sets - 10 reps - Seated Hip Adduction Isometrics with Ball  - 1 x daily - 7 x weekly - 1 sets - 10 reps - 5 hold Education details: Discussed POC and treatment interventions including, strength, balance and function Person educated: Patient Education method:  Explanation Education comprehension: verbalized understanding  HOME EXERCISE PROGRAM:  Supine Bridge  - 1 x daily - 7 x weekly - 1 sets - 10 reps - Seated Long Arc Quad  - 1 x daily - 7 x weekly - 1 sets - 10 reps - Standing Hip Abduction with Counter Support  - 1 x daily - 7 x weekly - 1 sets - 10 reps - Seated March  - 1 x daily - 7 x weekly - 1 sets - 10 reps - Standing March with Counter Support  - 1 x daily - 7 x weekly - 1 sets - 10 reps - Standing Heel Raise  - 1 x daily - 7 x weekly - 1 sets - 10 reps - Seated Hip Adduction Isometrics with Ball  - 1 x daily - 7 x weekly - 1 sets - 10 reps - 5 hold  ASSESSMENT:  CLINICAL IMPRESSION:  Pt improved with wt. Shifting for  sit to stand from chair with ax pad.  She had slightly more difficulty with activities in parallel bars due to feeling  a little Woozy. Fatigued after treatment  OBJECTIVE IMPAIRMENTS: decreased activity tolerance, decreased balance, decreased cognition, decreased endurance, decreased knowledge of condition, difficulty walking, decreased strength, impaired sensation, impaired vision/preception, postural dysfunction, and pain.   ACTIVITY LIMITATIONS: carrying, lifting, standing, squatting, bed mobility, dressing, and  locomotion level  PARTICIPATION LIMITATIONS: meal prep, cleaning, laundry, driving, shopping, and community activity  PERSONAL FACTORS: Age and 3+ comorbidities: multiple myeloma,Macular Degeneration , Afib are also affecting patient's functional outcome.   REHAB POTENTIAL: Good  CLINICAL DECISION MAKING: Evolving/moderate complexity  EVALUATION COMPLEXITY: Low   GOALS: Goals reviewed with patient? Yes  SHORT TERM GOALS: Target date: 07/30/2023  Pt will be independent in a HEP for LE/core strength Baseline: Goal status: INITIAL  2.  Pt will be able to maintain half tandem stance bilaterally x 10 sec Baseline:  Goal status: INITIAL  3.  Pt will perform 6-7 sit to stands in 30 seconds to  decrease fall risk Baseline:  Goal status: INITIAL  4.  Pt will perform TUG in 26 seconds to decrease fall risk Baseline:  Goal status: INITIAL   LONG TERM GOALS: Target date: 08/20/2023  Pt will be able to maintain tandem stance for 10 seconds B to demonstrate improved balance Baseline:  Goal status: INITIAL  2.  Pt will be able to perform 9 sit to stands in 30 seconds for average score to decrease fall risk Baseline:  Goal status: INITIAL  3.  Pt will be able to perform TUG with rollator in 24 seconds or less for improved gait speed and decreased fall risk Baseline:  Goal status: INITIAL  4.  Pt will be independent in more advanced HEP Baseline:  Goal status: INITIAL   PLAN:  PT FREQUENCY: 2x/week  PT DURATION: 6 weeks  PLANNED INTERVENTIONS: Therapeutic exercises, Therapeutic activity, Neuromuscular re-education, Balance training, Gait training, Patient/Family education, Self Care, and Manual therapy  PLAN FOR NEXT SESSION:  , progress  strength,balance, function, gait   Waynette Buttery, PT 08/02/2023, 10:51 AM

## 2023-08-06 ENCOUNTER — Other Ambulatory Visit: Payer: Self-pay | Admitting: Hematology and Oncology

## 2023-08-07 ENCOUNTER — Encounter: Payer: Self-pay | Admitting: Hematology and Oncology

## 2023-08-07 ENCOUNTER — Encounter: Payer: Medicare Other | Admitting: Physical Therapy

## 2023-08-07 NOTE — Progress Notes (Signed)
[facility] [phone]   [phone]   PROGRESS NOTE   Patient Care Team: [facility], [facility] as PCP - General (Rheumatology)   Hematological/Oncological History # IgG Lambda Multiple Myeloma  06/14/2022: establish care with [name] due to anemia. Labs showed M protein 4.9, Kappa 7.2, Lambda 10.8, ratio 0.67 07/13/2022: Bmbx showed Lambda restricted plasma cell neoplasm involving approximately 60% of the cellular marrow by IHC on the biopsy.  08/01/2022: Cycle 1 Day 1 of VRd chemotherapy. (Holding revlimid initially) 08/21/2022:  Cycle 2 Day 1 of VRd chemotherapy. (Holding revlimid) 09/04/2022: Cycle 3 Day 1 of VRd chemotherapy. Started revlimid 10/03/2022: Cycle 4 Day 1 of VRd chemotherapy 10/31/2022: Cycle 5 Day 1 of VRd chemotherapy 11/20/2022: Cycle 6 Day 1 of VRd chemotherapy 12/11/2022: Cycle 7 Day 1 of VRd chemotherapy 01/02/2023: Cycle 8 Day 1 of VRd chemotherapy 01/22/2023: Cycle 9 Day 1 of VRd chemotherapy 02/23/2023: Day 1 Cycle 1 of Dara/Rev/Dex 03/23/2023: Day 1 Cycle 2 of Dara/Rev/Dex 05/06/2023: chemotherapy on pause due to chest pain/cardiac/respiratory issues.  05/18/2023: Cycle 3 Day 1 of Dara/Rev/Dex 06/15/2023: Cycle 4 Day 1 of Dara/Rev/Dex 07/13/2023: Cycle 5 Day 1 of Dara/Rev/Dex   INTERVAL HISTORY: [place] B Homan 89 y.o. female returns to the clinic today for a follow-up visit accompanied by her daughter-in-law. Her last visit was on 06/29/2023. In the interim, she continued on Dara/Rev/Dex.   Ms. Holian reports she has been well overall in the room since her last visit.  She did have a urinary tract infection and a yeast infection.  She is having some occasional lightheadedness and dizziness but no shortness of breath.  Her blood pressure is a little low today and she has lost some weight.  She has been on diuretic therapy I am concerned that the diuresis may be pushing her fluid status to be too dry.  She denies any  fevers, chills, sweats, shortness of breath, chest pain or cough.  She has no other complaints.  A full 10 point ROS is otherwise negative.  MEDICAL HISTORY: Past Medical History:  Diagnosis Date   Arthritis    some - per patient   Breast cancer (HCC)    breast cancer / left    Cataract    bilat    GERD (gastroesophageal reflux disease)    History of kidney stones    Hyperlipidemia    Hypertension    Hypothyroidism    Macular degeneration    Left   S/P TAVR (transcatheter aortic valve replacement) 09/03/2018   23 mm Edwards Sapien 3 transcatheter heart valve placed via percutaneous right transfemoral approach    Severe aortic stenosis    Stress incontinence    Thyroid disease    Tinnitus     ALLERGIES:  is allergic to quinolones, penicillins, and sulfa antibiotics.  MEDICATIONS:  Current Outpatient Medications  Medication Sig Dispense Refill   polyethylene glycol (MIRALAX / GLYCOLAX) 17 g packet Take 17 g by mouth daily as needed for mild constipation. (Patient not taking: Reported on 07/31/2023)     acetaminophen (TYLENOL) 500 MG tablet Take 500 mg by mouth every 6 (six) hours as needed for moderate pain.     acyclovir (ZOVIRAX) 400 MG tablet Take 1 tablet (400 mg total) by mouth 2 (two) times daily. 60 tablet 3   albuterol (VENTOLIN HFA) 108 (90 Base) MCG/ACT inhaler Inhale 2 puffs into the lungs every 6 (six) hours as needed for wheezing or shortness of breath. 8 g 2  amiodarone (PACERONE) 200 MG tablet Take 1 tablet (200 mg total) by mouth daily. (Patient taking differently: Take 100 mg by mouth daily.) 45 tablet 1   apixaban (ELIQUIS) 5 MG TABS tablet Take 1 tablet (5 mg total) by mouth 2 (two) times daily. 180 tablet 1   Artificial Tear Solution (SOOTHE XP) SOLN Place 1 drop into both eyes every evening.     Cholecalciferol (VITAMIN D3) 50 MCG (2000 UT) capsule Take 1 capsule (2,000 Units total) by mouth daily.     [employer] VITAMIN B12 1000 MCG tablet TAKE 1 TABLET BY MOUTH  EVERY DAY 90 tablet 1   dexamethasone (DECADRON) 4 MG tablet Take 5 tablets (20 mg total) by mouth every 14 (fourteen) days. Take in the morning with food on treatment days (Patient taking differently: Take 20 mg by mouth every 21 ( twenty-one) days.) 30 tablet 2   esomeprazole (NEXIUM) 20 MG capsule Take 20 mg by mouth daily.     fluconazole (DIFLUCAN) 150 MG tablet Take 1 tablet (150 mg total) by mouth daily. May repeat in 3 days if needed (Patient not taking: Reported on 07/31/2023) 2 tablet 0   lenalidomide (REVLIMID) 10 MG capsule Take 1 capsule (10 mg total) by mouth daily. [employer] Auth # [id number]     Date Obtained 07/25/23 Take 1 capsule daily for 21 days then none for 7 days 21 capsule 0   levothyroxine (SYNTHROID, LEVOTHROID) 100 MCG tablet Take 100 mcg by mouth daily before breakfast.  2   losartan (COZAAR) 25 MG tablet Take 1 tablet (25 mg total) by mouth daily. 30 tablet 2   metoprolol tartrate (LOPRESSOR) 50 MG tablet TAKE 1 TABLET BY MOUTH TWICE A DAY 60 tablet 1   miconazole (MONISTAT 7) 2 % vaginal cream Apply thin layer to external vaginal area nightly 45 g 0   Multiple Vitamins-Minerals (PRESERVISION AREDS) CAPS Take 1 capsule by mouth 2 (two) times daily.     pravastatin (PRAVACHOL) 40 MG tablet Take 40 mg by mouth every evening.     sennosides-docusate sodium (SENOKOT-S) 8.6-50 MG tablet Take 1 tablet by mouth daily as needed for constipation.     spironolactone (ALDACTONE) 25 MG tablet Take 1 tablet (25 mg total) by mouth daily. 30 tablet 2   triamcinolone ointment (KENALOG) 0.5 % APPLY TO AFFECTED AREA TWICE A DAY 30 g 0   No current facility-administered medications for this visit.    SURGICAL HISTORY:  Past Surgical History:  Procedure Laterality Date   ABDOMINAL HYSTERECTOMY  1970's   BACK SURGERY     BREAST LUMPECTOMY  12/1998   lumpectomy   CARDIAC CATHETERIZATION     CARDIOVERSION N/A 04/18/2023   Procedure: CARDIOVERSION;  Surgeon: Tessa Lerner, DO;  Location: [facility]  INVASIVE CV LAB;  Service: Cardiovascular;  Laterality: N/A;   CARDIOVERSION N/A 04/20/2023   Procedure: CARDIOVERSION;  Surgeon: Yates Decamp, MD;  Location: [facility] INVASIVE CV LAB;  Service: Cardiovascular;  Laterality: N/A;   EYE SURGERY     cataract surgery bilat    INTRAOPERATIVE TRANSTHORACIC ECHOCARDIOGRAM N/A 09/03/2018   Procedure: INTRAOPERATIVE TRANSTHORACIC ECHOCARDIOGRAM;  Surgeon: Kathleene Hazel, MD;  Location: [facility] OR;  Service: Open Heart Surgery;  Laterality: N/A;   KYPHOPLASTY N/A 09/07/2022   Procedure: THORACIC EIGHT KYPHOPLASTY;  Surgeon: Venita Lick, MD;  Location: [facility] OR;  Service: Orthopedics;  Laterality: N/A;  1 hr Local with [facility] 3 C-Bed   LITHOTRIPSY     Right total knee  2018 Dr. Charlann Boxer   RIGHT/LEFT HEART CATH AND CORONARY ANGIOGRAPHY N/A 08/06/2018   Procedure: RIGHT/LEFT HEART CATH AND CORONARY ANGIOGRAPHY;  Surgeon: Yates Decamp, MD;  Location: [facility] INVASIVE CV LAB;  Service: Cardiovascular;  Laterality: N/A;   THYROIDECTOMY, PARTIAL  1975   TONSILLECTOMY     as a child - patient not sure of exact date   TOTAL KNEE ARTHROPLASTY Left 03/13/2016   Procedure: TOTAL KNEE ARTHROPLASTY;  Surgeon: Durene Romans, MD;  Location: [facility] ORS;  Service: Orthopedics;  Laterality: Left;   TOTAL KNEE ARTHROPLASTY Right 06/18/2017   Procedure: RIGHT TOTAL KNEE ARTHROPLASTY;  Surgeon: Durene Romans, MD;  Location: [facility] ORS;  Service: Orthopedics;  Laterality: Right;   TRANSCATHETER AORTIC VALVE REPLACEMENT, TRANSFEMORAL N/A 09/03/2018   Procedure: TRANSCATHETER AORTIC VALVE REPLACEMENT, TRANSFEMORAL;  Surgeon: Kathleene Hazel, MD;  Location: [facility] OR;  Service: Open Heart Surgery;  Laterality: N/A;   REVIEW OF SYSTEMS:   Constitutional: ( - ) fevers, ( - )  chills , ( - ) night sweats Eyes: ( - ) blurriness of vision, ( - ) double vision, ( - ) watery eyes Ears, nose, mouth, throat, and face: ( - ) mucositis, ( - ) sore throat Respiratory: ( -) cough, ( - ) dyspnea, ( -  ) wheezes Cardiovascular: ( - ) palpitation, ( - ) chest discomfort, ( +) lower extremity swelling Gastrointestinal:  ( - ) nausea, ( - ) heartburn, ( - ) change in bowel habits Skin: ( - ) abnormal skin rashes Lymphatics: ( - ) new lymphadenopathy, ( - ) easy bruising Neurological: ( - ) numbness, ( - ) tingling, ( - ) new weaknesses Behavioral/Psych: ( - ) mood change, ( - ) new changes  All other systems were reviewed with the patient and are negative.   PHYSICAL EXAMINATION:  Blood pressure (!) 115/54, pulse (!) 56, temperature 97.6 F (36.4 C), temperature source Oral, resp. rate 15, height 5\' 3"  (1.6 m), weight 149 lb 6.4 oz (67.8 kg), SpO2 100%.  ECOG PERFORMANCE STATUS: 1  GENERAL: well appearing elderly Caucasian female, alert, no distress and comfortable SKIN: skin color, texture, turgor are normal, no rashes or significant lesions EYES: conjunctiva are pink and non-injected, sclera clear LUNGS:normal breathing effort. Mild wheezing and crackles in lower lobes b/l.  HEART: irregular rhythm ,normal rate and no murmurs. +1 bilateral lower extremity edema, mainly in ankle and lower calf.  Musculoskeletal: no cyanosis of digits and no clubbing  PSYCH: alert & oriented x 3, fluent speech NEURO: no focal motor/sensory deficits    LABORATORY DATA: Lab Results  Component Value Date   WBC 5.5 07/27/2023   HGB 14.7 07/27/2023   HCT 43.9 07/27/2023   MCV 89.0 07/27/2023   PLT 85 (L) 07/27/2023      Chemistry      Component Value Date/Time   NA 131 (L) 07/27/2023 0858   NA 130 (L) 06/19/2022 1012   K 4.3 07/27/2023 0858   CL 96 (L) 07/27/2023 0858   CO2 26 07/27/2023 0858   BUN 34 (H) 07/27/2023 0858   BUN 21 06/19/2022 1012   CREATININE 1.17 (H) 07/27/2023 0858      Component Value Date/Time   CALCIUM 9.2 07/27/2023 0858   ALKPHOS 80 07/27/2023 0858   AST 16 07/27/2023 0858   ALT 17 07/27/2023 0858   BILITOT 0.6 07/27/2023 0858       RADIOGRAPHIC  STUDIES:  DG CHEST PORT 1 VIEW  Result Date: 07/16/2023 CLINICAL DATA:  Near syncope, chest pain. EXAM: PORTABLE CHEST 1 VIEW COMPARISON:  Chest radiographs 04/15/2023. FINDINGS: No consolidation or pulmonary edema. Stable cardiac and mediastinal contours with TAVR in place. Unchanged appearance from prior vertebral augmentation in the lower thoracic spine. No pleural effusion or pneumothorax. IMPRESSION: No evidence of acute cardiopulmonary disease. Electronically Signed   By: Orvan Falconer M.D.   On: 07/16/2023 16:20     ASSESSMENT/PLAN:  NAILEAH KARG is a 89 y.o. female who presents to the clinic for evaluation for IgG lambda multiple myeloma.   # IgG Lambda Multiple Myeloma  --diagnosis of MM confirmed with bone marrow biopsy showing 60% plasma cells and anemia -- Bone survey shows no lytic lesions, kidney function is within normal limits.  --08/01/2022 was Cycle 1 Day 1 of VRd  -Dr. Leonides Schanz discontinued velcade on 02/05/23 due to rash. She is status post day 15 cycle #9.  -Dr. Leonides Schanz changed the care plan to Daratumumab, Revlimid, and decadron. She is scheduled for day 1 cycle #1 today  -Started Cycle 1, Day 1 of Dara/Rev/Dex on 02/23/2023.  Plan:  --Due for Cycle 5 Day 15 of Dara/Rev/Dex today --Labs show white blood cell count white blood cell 5.5, hemoglobin 14.7, MCV 89, platelets 85 --last M protein from 07/13/2023 trended down to 0.1 (4.9 prior to start of therapy). Normalized SFLC.  -- Dara IFE study shows no M protein. We will plan to transition to maintenance therapy with Revlimid 10 mg p.o. daily 21 days on and 7 days off after next visit.  --RTC in 2 weeks, then once monthly for labs checks on maintenance therapy.   #Dysuria/increased urinary frequency: --Will check UA/culture to evaluate for UTI.  --Okay to take Pyridium for burning sensation until urine studies finalize.   #Hypotension/BP/Atrial Fibrillation  --Follows with cardiology, Dr. Jacinto Halim -- Recommendations  include metoprolol succinate 25 mg PO daily and restarted patient back on amiodarone 200 mg PO daily x 10 days followed by 12 mg daily.  --Continue on Eliquis 5 mg BID   #Bilateral lower extremity edema:  --Continue to wear compression stockings.    # Normocytic Anemia #Vitamin B12 Deficiency --Anemia likely driven by multiple myeloma, but may be component of vitamin B12 deficiency as well. --initial labs showed elevated MMA with B12 180 -- Continue vitamin B12 1000 mcg p.o. daily -- Continue to monitor   #Left leg/thigh neuropathy--improving: --Currently on gabapentin 900 mg nightly and 600 mg in the morning --Encouraged to try water aerobics and stretches.  --Following with Dr. Debbe Bales (ortho) to see if she is a candidate for steroid injections.  -- Patient evaluated by Dr. Barbaraann Cao thinks that this may be neuropathy worsened by her Velcade therapy.   #Episode of right UE tremor and speech abnormality: --Evaluated by Dr. Barbaraann Cao on 03/20/2023. Presumed etiology had been atrial fibrillation, but vascular disease, atheromatous changes, are also possibly implicated concurrently.  --MRI brain from 03/13/2023 show no acute finding including no evidence of intracranial neoplastic disease. There are chronic small-vessel ischemic changes of the pons, cerebral hemispheric white matter and right cerebellum. --Educated patient to seek immediate evaluate if there are repeat episodes. --No neurological deficits identified per exam today  #Supportive Care -- chemotherapy education complete.  -- port placement not required.  --Awaiting to start Zometa/Xgeva  -- zofran 8mg  q8H PRN and compazine 10mg  PO q6H for nausea -- acyclovir 400mg  PO BID for VCZ prophylaxis -- tylenol 1000 mg q8H PRN for back pain.   No orders of the defined types were  placed in this encounter.   Patient expressed understanding of the plan provided.   I have spent a total of 30 minutes minutes of face-to-face and  non-face-to-face time, preparing to see the patient, performing a medically appropriate examination, counseling and educating the patient, ordering tests/procedures, documenting clinical information in the electronic health record, and care coordination.   Ulysees Barns, MD Department of Hematology/Oncology [facility] Phone: [phone] Pager: [phone] Email: [email or web address]

## 2023-08-09 ENCOUNTER — Ambulatory Visit: Payer: Medicare Other

## 2023-08-09 ENCOUNTER — Ambulatory Visit: Payer: Medicare Other | Admitting: Cardiology

## 2023-08-09 DIAGNOSIS — R2689 Other abnormalities of gait and mobility: Secondary | ICD-10-CM | POA: Diagnosis not present

## 2023-08-09 DIAGNOSIS — C9000 Multiple myeloma not having achieved remission: Secondary | ICD-10-CM

## 2023-08-09 DIAGNOSIS — M6281 Muscle weakness (generalized): Secondary | ICD-10-CM | POA: Diagnosis not present

## 2023-08-09 DIAGNOSIS — R262 Difficulty in walking, not elsewhere classified: Secondary | ICD-10-CM | POA: Diagnosis not present

## 2023-08-09 NOTE — Therapy (Signed)
OUTPATIENT PHYSICAL THERAPY  LOWER EXTREMITY ONCOLOGY TREATMENT  Patient Name: Zoe [name] MRN: [id number] DOB:September 04, 1934, 89 y.o., female Today's Date: 08/09/2023  END OF SESSION:  PT End of Session - 08/09/23 1000     Visit Number 6    Number of Visits 12    Date for PT Re-Evaluation 08/20/23    Authorization Type [employer]    Authorization Time Period 9/16-12/28/2024    Authorization - Visit Number 6    Authorization - Number of Visits 12    PT Start Time 1002    PT Stop Time 1055    PT Time Calculation (min) 53 min    Equipment Utilized During Treatment Gait belt    Activity Tolerance Patient tolerated treatment well;Patient limited by fatigue    Behavior During Therapy WFL for tasks assessed/performed             Past Medical History:  Diagnosis Date   Arthritis    some - per patient   Breast cancer (HCC)    breast cancer / left    Cataract    bilat    GERD (gastroesophageal reflux disease)    History of kidney stones    Hyperlipidemia    Hypertension    Hypothyroidism    Macular degeneration    Left   S/P TAVR (transcatheter aortic valve replacement) 09/03/2018   23 mm Edwards Sapien 3 transcatheter heart valve placed via percutaneous right transfemoral approach    Severe aortic stenosis    Stress incontinence    Thyroid disease    Tinnitus    Past Surgical History:  Procedure Laterality Date   ABDOMINAL HYSTERECTOMY  1970's   BACK SURGERY     BREAST LUMPECTOMY  12/1998   lumpectomy   CARDIAC CATHETERIZATION     CARDIOVERSION N/A 04/18/2023   Procedure: CARDIOVERSION;  Surgeon: Tessa Lerner, DO;  Location: [facility];  Service: Cardiovascular;  Laterality: N/A;   CARDIOVERSION N/A 04/20/2023   Procedure: CARDIOVERSION;  Surgeon: Yates Decamp, MD;  Location: [facility];  Service: Cardiovascular;  Laterality: N/A;   EYE SURGERY     cataract surgery bilat    INTRAOPERATIVE TRANSTHORACIC ECHOCARDIOGRAM N/A 09/03/2018   Procedure:  INTRAOPERATIVE TRANSTHORACIC ECHOCARDIOGRAM;  Surgeon: Kathleene Hazel, MD;  Location: [facility] OR;  Service: Open Heart Surgery;  Laterality: N/A;   KYPHOPLASTY N/A 09/07/2022   Procedure: THORACIC EIGHT KYPHOPLASTY;  Surgeon: Venita Lick, MD;  Location: [facility] OR;  Service: Orthopedics;  Laterality: N/A;  1 hr Local with [facility] 3 C-Bed   LITHOTRIPSY     Right total knee     2018 Dr. Charlann Boxer   RIGHT/LEFT HEART CATH AND CORONARY ANGIOGRAPHY N/A 08/06/2018   Procedure: RIGHT/LEFT HEART CATH AND CORONARY ANGIOGRAPHY;  Surgeon: Yates Decamp, MD;  Location: [facility];  Service: Cardiovascular;  Laterality: N/A;   THYROIDECTOMY, PARTIAL  1975   TONSILLECTOMY     as a child - patient not sure of exact date   TOTAL KNEE ARTHROPLASTY Left 03/13/2016   Procedure: TOTAL KNEE ARTHROPLASTY;  Surgeon: Durene Romans, MD;  Location: [facility] ORS;  Service: Orthopedics;  Laterality: Left;   TOTAL KNEE ARTHROPLASTY Right 06/18/2017   Procedure: RIGHT TOTAL KNEE ARTHROPLASTY;  Surgeon: Durene Romans, MD;  Location: [facility] ORS;  Service: Orthopedics;  Laterality: Right;   TRANSCATHETER AORTIC VALVE REPLACEMENT, TRANSFEMORAL N/A 09/03/2018   Procedure: TRANSCATHETER AORTIC VALVE REPLACEMENT, TRANSFEMORAL;  Surgeon: Kathleene Hazel, MD;  Location: [facility] OR;  Service: Open  Heart Surgery;  Laterality: N/A;   Patient Active Problem List   Diagnosis Date Noted   Hypertensive urgency 07/16/2023   Urinary tract infection due to Klebsiella species 07/16/2023   Heart failure with preserved ejection fraction (HCC) 07/16/2023   Hypokalemia 07/16/2023   Hypocalcemia 07/16/2023   Atypical atrial flutter (HCC) 04/19/2023   Acute on chronic diastolic heart failure (HCC) 04/19/2023   Atrial fibrillation with rapid ventricular response (HCC) 04/18/2023   History of stroke 04/16/2023   Fall at home, initial encounter 04/16/2023   Atrial fibrillation (HCC) 04/16/2023   Paroxysmal atrial fibrillation (HCC) 04/15/2023    Elevated troponin 04/15/2023   Chest pain 04/13/2023   Stroke due to embolism (HCC) 03/20/2023   Chest pain, rule out acute myocardial infarction 02/17/2023   Chemotherapy-induced neuropathy (HCC) 11/28/2022   Pain due to onychomycosis of toenails of both feet 11/08/2022   Multiple myeloma not having achieved remission (HCC) 07/24/2022   Chronic diastolic CHF (congestive heart failure) (HCC) 05/16/2022   Symptomatic anemia 05/16/2022   Hyponatremia 04/02/2022   Microcytic anemia 04/02/2022   S/P TAVR (transcatheter aortic valve replacement) 09/03/2018   Hypothyroidism    Hypertension    Hyperlipidemia    GERD (gastroesophageal reflux disease)    S/P right TKA 06/18/2017   Class 1 obesity 03/15/2016   S/P left TKA 03/13/2016   History of breast cancer, DCIS, lumpectomy March 2000 12/13/2011    PCP: Dr. Irena Reichmann  REFERRING PROVIDER: Dr. Jeanie Sewer, IV  REFERRING DIAG: Multiple Myeloma  THERAPY DIAG:  Multiple myeloma without remission (HCC)  Muscle weakness (generalized)  Difficulty walking  Balance problem  ONSET DATE: 07/2022  Rationale for Evaluation and Treatment: Rehabilitation  SUBJECTIVE:                                                                                                                                                                                           SUBJECTIVE STATEMENT:  I was really sore after last visit but that's OK. I have a little back pain but I don't take anything for it. I don't feel woozy today. I feel pretty good  eval I need to get my balance better so I can move without fear of falling. Difficult getting up and down from chairs, Some fatigue with walking short distances;ie car into clinic. Reports some foot numbness if she is cold but feels good with socks on. Her grandson lives with her, but she has a home health aid in the day time and the night time for safety PERTINENT HISTORY:  Pt was diagnosed with  IgGMultiple  Myeloma in 07/2022 after a bone Marrow biopsy  showed 60% plasma cells and anemia. No lytic lesions. She was started on chemotherapy. She had surgery for T 8 compression fracture kyphoplasty on 09/07/2022. She was hospitalized 04/15/2023 for Atrial Fibrillation with cardioversion attempted on 04/18/2023. She had some PT at that time. She has a home health aide that drives her to appts and helps with keeping her house clean And someone at night to help her get to the bathroom  PMH significant for A-fib on Eliquis and amiodarone, hypothyroidism, HTN, breast cancer, multiple myeloma,aortic stenosis status post TAVR, hx of TIA, Macular degeneration, Head aches she thinks related to her eyes PAIN:  Are you having pain? No, j Just some discomfort in my back  PRECAUTIONS: PMH significant of A-fib on Eliquis and amiodarone, hypothyroidism, HTN, Left breast cancer, multiple myeloma,aortic stenosis status post TAVR, hx of TIA, T8 compression fx with kyphoplasty  RED FLAGS: Bowel or bladder incontinence: Yes:   and Compression fracture: Yes: had a kyphoplasty    WEIGHT BEARING RESTRICTIONS: No  FALLS:  Has patient fallen in last 6 months? Yes. Number of falls 1, fell in community garden: no injury  LIVING ENVIRONMENT: Lives with: Lucila Maine lives with her presently Lives in: House/apartment Stairs: No;  Has following equipment at home: Single point cane, Environmental consultant - 4 wheeled, and shower chair  OCCUPATION: Retired  LEISURE: read,visit with friends  PRIOR LEVEL OF FUNCTION: Independent with basic ADLs, Independent with household mobility with device, and Needs assistance with homemaking  PATIENT GOALS: Be able to walk more securely, not be dependent on a walker/rollator   OBJECTIVE:  COGNITION: Overall cognitive status: deficits  PALPATION:   OBSERVATIONS / OTHER ASSESSMENTS: Ambulates with rollator, flexed posture/increased kyphosis  SENSATION: Light touch: deficits   POSTURE: forward head,  rounded shoulders, increased thoracic kyphosis  LOWER EXTREMITY STRENGTH:  MMT Right eval  Hip flexion 4-  Hip extension Able to bridge  Hip abduction 4-  Hip adduction 4 -seated  Hip internal rotation 3-  Hip external rotation 3+  Knee flexion 4+  Knee extension 4+  Ankle dorsiflexion 5  Ankle plantarflexion   Ankle inversion   Ankle eversion   Great toe extension    (Blank rows = not tested)  MMT LEFT eval  Hip flexion 4  Hip extension Able to bridge  Hip abduction 4-  Hip adduction 4-  Hip internal rotation 3+  Hip external rotation 3  Knee flexion 4+  Knee extension 4  Ankle dorsiflexion 5  Ankle plantarflexion   Ankle inversion   Ankle eversion   Great toe extension     (Blank rows = not tested)  LYMPHEDEMA ASSESSMENTS:   SURGERY TYPE/DATE: Left lumpectomy 2000  NUMBER OF LYMPH NODES REMOVED: Thinks so, not sure how many  CHEMOTHERAPY: Yes for Multiple Myeloma, No for Breast Cancer  RADIATION:No for Breast Cancer  HORMONE TREATMENT: Yes for Breast Cancer  INFECTIONS: NO  LYMPHEDEMA ASSESSMENTS:   LOWER EXTREMITY LANDMARK RIGHT eval  At groin   30 cm proximal to suprapatella   20 cm proximal to suprapatella   10 cm proximal to suprapatella   At midpatella / popliteal crease   30 cm proximal to floor at lateral plantar foot   20 cm proximal to floor at lateral plantar foot   10 cm proximal to floor at lateral plantar foot   Circumference of ankle/heel   5 cm proximal to 1st MTP joint   Across MTP joint   Around proximal great toe   (Blank rows = not  tested)  LOWER EXTREMITY LANDMARK LEFT eval  At groin   30 cm proximal to suprapatella   20 cm proximal to suprapatella   10 cm proximal to suprapatella   At midpatella / popliteal crease   30 cm proximal to floor at lateral plantar foot   20 cm proximal to floor at lateral plantar foot   10 cm proximal to floor at lateral plantar foot   Circumference of ankle/heel   5 cm proximal to 1st  MTP joint   Across MTP joint   Around proximal great toe   (Blank rows = not tested)  FUNCTIONAL TESTS:  30 seconds sit to stand test: 5 repetitions with use of UE's  Timed up and go: 30.4 seconds with rollator and use of hands to rise from chair  Four position balance test:  able to maintain DLS feet together x 10 sec, and half tandem with left foot forward x 10 sec. , unable to hold half tandem with right foot forward for greater than 3 seconds, unable to maintain tandem stance bilaterally for more than a few seconds. Did not test SLS GAIT: Distance walked: Nustep to ramp in front of building Assistive device utilized: Rollator Level of assistance: Modified independence Comments:flexed posture, increased kyphosis    Outcome measure:  TODAY'S TREATMENT:                                                                                                                                          DATE:   08/09/2023 02 98, HR 63 Nu Step;Seat 8, UE 8, Lev 3 x 6 min In parallel bars: marching with HH x 10, with intermittent HH x 10 CGA of PT Heel raises x 15 with HH, x 10 no HH, CGA of PT  tandem stance x 2 reps each, intermittent hand touchCGA seated rest Sit to stand from chair CGA PT  2 x 5 O2 99, HR 64 afterwards HS Curls x 10 with Red Ambulated to ortho gym and return with a full lap around cancer gym; independent with rollator;VC's for erect posture 6 in step ups x 10 ea leg Mini lunge x 4 ea in parallel bars with HH  08/02/2023 O2 99%, HR 60 Nu Step;Seat 8, UE 8, Lev 2 x 6 min 399steps In parallel bars: marching with HH x 10, with intermittent HH x 10 CGA of PT Heel raises x 10 with HH, x 10 no HH, CGA of PT Semi tandem and tandem stance x 2-3 reps each, CGA seated rest Sit to stand with purple pad on chair 2 x 5 LAQ 2 x 10 B with 2# Ambulated from parallel bars  cancer end to end of ortho gym; brief rest O2 96%, HR 59 then returned back to parallel bars on Cancer side.  Seated rest  07/31/2023 O2 99%, HR 66, BP 117/66 Nu Step;Seat 8, UE 8, Lev 2  x 5 min 334 steps sit to stand from chair with purple cushion 2 x 5, 1 x 4 without cushion with min A PT and VC's for lift In parallel bars: marching with HH x 10, without HH x 10 CGA of PT Heel raises x 10 with HH, x 10 no HH, CGA of PT Semi tandem and tandem stance x 2-3 reps each, CGA seated rest Purple ball squeeze x 10 5 sec hold; emphasis on erect posture Scapular retraction with yellow band x 10 Ambulated from parallel bars to end of ortho gym and return with rollator with emphasis on erect posture, SBA     07/26/2023 Took pts blood pressure 124/60 O2 97, HR 53,  prior to rx Nu Step;Seat 8, UE 8, Lev 2 x 2:30 x 2 reps with rest between sets HR 63 after 2:30 seconds, after seated rest HR 53, O2 100% Heel raises x 15 seated Toe raises x 15 Marching x 15 seated Seated LAQ  2x 10 alternating with 2# B Ball squeeze x 10 Updated HEP with pictures;gave large copy due to Macular degeneration Treatment decreased secondary to pt feeling a little dizzy and fatigued due to sickness the last few weeks and new meds.     07/12/2023 Assessed MMT  B Sit to stand from slightly higher mat table 2 x 5 with emphasis on control and form without use of hands Nu Step;Seat 7, UE 8, Lev 2 x 5 min, 238 steps Heel raises x 10 with HH in bars Marching x 10 with HH, in bars Hip abd x 10 B, HH Seated LAQ  2x 10 alternating with 2# B Ball squeeze x 10 Bridging 2 x 5 Piriformis stretch x 2 ea 20 sec supine Updated HEP with pictures;gave large copy due to Macular degeneration  EVAL Discussed POC and treatment interventions including, strength, postural education, balance and function   PATIENT EDUCATION:  Access Code: [email or web address] URL: [email or web address] Date: 07/12/2023 Prepared by: Alvira Monday  Exercises - Supine Bridge  - 1 x daily - 7 x weekly - 1 sets - 10 reps - Seated Long Arc Quad  - 1 x  daily - 7 x weekly - 1 sets - 10 reps - Standing Hip Abduction with Counter Support  - 1 x daily - 7 x weekly - 1 sets - 10 reps - Seated March  - 1 x daily - 7 x weekly - 1 sets - 10 reps - Standing March with Counter Support  - 1 x daily - 7 x weekly - 1 sets - 10 reps - Standing Heel Raise  - 1 x daily - 7 x weekly - 1 sets - 10 reps - Seated Hip Adduction Isometrics with Ball  - 1 x daily - 7 x weekly - 1 sets - 10 reps - 5 hold Education details: Discussed POC and treatment interventions including, strength, balance and function Person educated: Patient Education method: Explanation Education comprehension: verbalized understanding  HOME EXERCISE PROGRAM:  Supine Bridge  - 1 x daily - 7 x weekly - 1 sets - 10 reps - Seated Long Arc Quad  - 1 x daily - 7 x weekly - 1 sets - 10 reps - Standing Hip Abduction with Counter Support  - 1 x daily - 7 x weekly - 1 sets - 10 reps - Seated March  - 1 x daily - 7 x weekly - 1 sets - 10 reps - Standing March with Counter Support  - 1 x daily -  7 x weekly - 1 sets - 10 reps - Standing Heel Raise  - 1 x daily - 7 x weekly - 1 sets - 10 reps - Seated Hip Adduction Isometrics with Ball  - 1 x daily - 7 x weekly - 1 sets - 10 reps - 5 hold  ASSESSMENT:  CLINICAL IMPRESSION: Increased resistance on Nu step and able to do sit to stand from chair without ax pad with CG but no real assist of PT x 8 with several occasions where she had to stop and reset.Ambulation today felt easier to pt today than when she did it last week and she was able to increase her pace of walking   OBJECTIVE IMPAIRMENTS: decreased activity tolerance, decreased balance, decreased cognition, decreased endurance, decreased knowledge of condition, difficulty walking, decreased strength, impaired sensation, impaired vision/preception, postural dysfunction, and pain.   ACTIVITY LIMITATIONS: carrying, lifting, standing, squatting, bed mobility, dressing, and locomotion  level  PARTICIPATION LIMITATIONS: meal prep, cleaning, laundry, driving, shopping, and community activity  PERSONAL FACTORS: Age and 3+ comorbidities: multiple myeloma,Macular Degeneration , Afib are also affecting patient's functional outcome.   REHAB POTENTIAL: Good  CLINICAL DECISION MAKING: Evolving/moderate complexity  EVALUATION COMPLEXITY: Low   GOALS: Goals reviewed with patient? Yes  SHORT TERM GOALS: Target date: 07/30/2023  Pt will be independent in a HEP for LE/core strength Baseline: Goal status: INITIAL  2.  Pt will be able to maintain half tandem stance bilaterally x 10 sec Baseline:  Goal status: INITIAL  3.  Pt will perform 6-7 sit to stands in 30 seconds to decrease fall risk Baseline:  Goal status: INITIAL  4.  Pt will perform TUG in 26 seconds to decrease fall risk Baseline:  Goal status: INITIAL   LONG TERM GOALS: Target date: 08/20/2023  Pt will be able to maintain tandem stance for 10 seconds B to demonstrate improved balance Baseline:  Goal status: INITIAL  2.  Pt will be able to perform 9 sit to stands in 30 seconds for average score to decrease fall risk Baseline:  Goal status: INITIAL  3.  Pt will be able to perform TUG with rollator in 24 seconds or less for improved gait speed and decreased fall risk Baseline:  Goal status: INITIAL  4.  Pt will be independent in more advanced HEP Baseline:  Goal status: INITIAL   PLAN:  PT FREQUENCY: 2x/week  PT DURATION: 6 weeks  PLANNED INTERVENTIONS: Therapeutic exercises, Therapeutic activity, Neuromuscular re-education, Balance training, Gait training, Patient/Family education, Self Care, and Manual therapy  PLAN FOR NEXT SESSION:  , progress  strength,balance, function, gait   Waynette Buttery, PT 08/09/2023, 11:00 AM

## 2023-08-10 ENCOUNTER — Inpatient Hospital Stay (HOSPITAL_BASED_OUTPATIENT_CLINIC_OR_DEPARTMENT_OTHER): Payer: Medicare Other | Admitting: Physician Assistant

## 2023-08-10 ENCOUNTER — Inpatient Hospital Stay: Payer: Medicare Other

## 2023-08-10 VITALS — Temp 97.7°F

## 2023-08-10 VITALS — BP 132/52 | HR 58 | Resp 15 | Wt 152.4 lb

## 2023-08-10 DIAGNOSIS — R3915 Urgency of urination: Secondary | ICD-10-CM | POA: Diagnosis not present

## 2023-08-10 DIAGNOSIS — Z5112 Encounter for antineoplastic immunotherapy: Secondary | ICD-10-CM | POA: Diagnosis not present

## 2023-08-10 DIAGNOSIS — G629 Polyneuropathy, unspecified: Secondary | ICD-10-CM | POA: Diagnosis not present

## 2023-08-10 DIAGNOSIS — C9000 Multiple myeloma not having achieved remission: Secondary | ICD-10-CM

## 2023-08-10 DIAGNOSIS — R251 Tremor, unspecified: Secondary | ICD-10-CM | POA: Diagnosis not present

## 2023-08-10 DIAGNOSIS — D649 Anemia, unspecified: Secondary | ICD-10-CM | POA: Diagnosis not present

## 2023-08-10 DIAGNOSIS — D696 Thrombocytopenia, unspecified: Secondary | ICD-10-CM | POA: Diagnosis not present

## 2023-08-10 DIAGNOSIS — R300 Dysuria: Secondary | ICD-10-CM

## 2023-08-10 DIAGNOSIS — Z7901 Long term (current) use of anticoagulants: Secondary | ICD-10-CM | POA: Diagnosis not present

## 2023-08-10 DIAGNOSIS — R350 Frequency of micturition: Secondary | ICD-10-CM | POA: Diagnosis not present

## 2023-08-10 DIAGNOSIS — E538 Deficiency of other specified B group vitamins: Secondary | ICD-10-CM | POA: Diagnosis not present

## 2023-08-10 DIAGNOSIS — I4891 Unspecified atrial fibrillation: Secondary | ICD-10-CM | POA: Diagnosis not present

## 2023-08-10 DIAGNOSIS — N390 Urinary tract infection, site not specified: Secondary | ICD-10-CM | POA: Diagnosis not present

## 2023-08-10 LAB — CBC WITH DIFFERENTIAL (CANCER CENTER ONLY)
Abs Immature Granulocytes: 0.02 K/uL (ref 0.00–0.07)
Basophils Absolute: 0 K/uL (ref 0.0–0.1)
Basophils Relative: 1 %
Eosinophils Absolute: 0.1 K/uL (ref 0.0–0.5)
Eosinophils Relative: 3 %
HCT: 38.9 % (ref 36.0–46.0)
Hemoglobin: 13.2 g/dL (ref 12.0–15.0)
Immature Granulocytes: 1 %
Lymphocytes Relative: 7 %
Lymphs Abs: 0.3 K/uL — ABNORMAL LOW (ref 0.7–4.0)
MCH: 30.1 pg (ref 26.0–34.0)
MCHC: 33.9 g/dL (ref 30.0–36.0)
MCV: 88.8 fL (ref 80.0–100.0)
Monocytes Absolute: 0.6 K/uL (ref 0.1–1.0)
Monocytes Relative: 16 %
Neutro Abs: 2.8 K/uL (ref 1.7–7.7)
Neutrophils Relative %: 72 %
Platelet Count: 59 K/uL — ABNORMAL LOW (ref 150–400)
RBC: 4.38 MIL/uL (ref 3.87–5.11)
RDW: 17.8 % — ABNORMAL HIGH (ref 11.5–15.5)
WBC Count: 3.8 K/uL — ABNORMAL LOW (ref 4.0–10.5)
nRBC: 0 % (ref 0.0–0.2)

## 2023-08-10 LAB — CMP (CANCER CENTER ONLY)
ALT: 12 U/L (ref 0–44)
AST: 12 U/L — ABNORMAL LOW (ref 15–41)
Albumin: 3.7 g/dL (ref 3.5–5.0)
Alkaline Phosphatase: 64 U/L (ref 38–126)
Anion gap: 7 (ref 5–15)
BUN: 27 mg/dL — ABNORMAL HIGH (ref 8–23)
CO2: 26 mmol/L (ref 22–32)
Calcium: 8.8 mg/dL — ABNORMAL LOW (ref 8.9–10.3)
Chloride: 100 mmol/L (ref 98–111)
Creatinine: 0.92 mg/dL (ref 0.44–1.00)
GFR, Estimated: >60 mL/min (ref >60)
Glucose, Bld: 102 mg/dL — ABNORMAL HIGH (ref 70–99)
Potassium: 4.1 mmol/L (ref 3.5–5.1)
Sodium: 133 mmol/L — ABNORMAL LOW (ref 135–145)
Total Bilirubin: 0.6 mg/dL (ref 0.3–1.2)
Total Protein: 5.7 g/dL — ABNORMAL LOW (ref 6.5–8.1)

## 2023-08-10 LAB — LACTATE DEHYDROGENASE: LDH: 168 U/L (ref 98–192)

## 2023-08-10 MED ORDER — ACETAMINOPHEN 325 MG PO TABS
650.0000 mg | ORAL_TABLET | Freq: Once | ORAL | Status: AC
  Administered 2023-08-10: 650 mg via ORAL
  Filled 2023-08-10: qty 2

## 2023-08-10 MED ORDER — LORATADINE 10 MG PO TABS
10.0000 mg | ORAL_TABLET | Freq: Once | ORAL | Status: AC
  Administered 2023-08-10: 10 mg via ORAL
  Filled 2023-08-10: qty 1

## 2023-08-10 MED ORDER — DARATUMUMAB-HYALURONIDASE-FIHJ 1800-30000 MG-UT/15ML ~~LOC~~ SOLN
1800.0000 mg | Freq: Once | SUBCUTANEOUS | Status: AC
  Administered 2023-08-10: 1800 mg via SUBCUTANEOUS
  Filled 2023-08-10: qty 15

## 2023-08-10 NOTE — Patient Instructions (Signed)
[facility]  Discharge Instructions: Thank you for choosing [facility] to provide your oncology and hematology care.   If you have a lab appointment with the Cancer Center, please go directly to the Cancer Center and check in at the registration area.   Wear comfortable clothing and clothing appropriate for easy access to any Portacath or PICC line.   We strive to give you quality time with your provider. You may need to reschedule your appointment if you arrive late (15 or more minutes).  Arriving late affects you and other patients whose appointments are after yours.  Also, if you miss three or more appointments without notifying the office, you may be dismissed from the clinic at the provider's discretion.      For prescription refill requests, have your pharmacy contact our office and allow 72 hours for refills to be completed.    Today you received the following chemotherapy and/or immunotherapy agents Darzalex Faspro      To help prevent nausea and vomiting after your treatment, we encourage you to take your nausea medication as directed.  BELOW ARE SYMPTOMS THAT SHOULD BE REPORTED IMMEDIATELY: *FEVER GREATER THAN 100.4 F (38 C) OR HIGHER *CHILLS OR SWEATING *NAUSEA AND VOMITING THAT IS NOT CONTROLLED WITH YOUR NAUSEA MEDICATION *UNUSUAL SHORTNESS OF BREATH *UNUSUAL BRUISING OR BLEEDING *URINARY PROBLEMS (pain or burning when urinating, or frequent urination) *BOWEL PROBLEMS (unusual diarrhea, constipation, pain near the anus) TENDERNESS IN MOUTH AND THROAT WITH OR WITHOUT PRESENCE OF ULCERS (sore throat, sores in mouth, or a toothache) UNUSUAL RASH, SWELLING OR PAIN  UNUSUAL VAGINAL DISCHARGE OR ITCHING   Items with * indicate a potential emergency and should be followed up as soon as possible or go to the Emergency Department if any problems should occur.  Please show the CHEMOTHERAPY ALERT CARD or IMMUNOTHERAPY ALERT CARD at  check-in to the Emergency Department and triage nurse.  Should you have questions after your visit or need to cancel or reschedule your appointment, please contact [facility]  Dept: [phone]  and follow the prompts.  Office hours are 8:00 a.m. to 4:30 p.m. Monday - Friday. Please note that voicemails left after 4:00 p.m. may not be returned until the following business day.  We are closed weekends and major holidays. You have access to a nurse at all times for urgent questions. Please call the main number to the clinic Dept: [phone] and follow the prompts.   For any non-urgent questions, you may also contact your provider using MyChart. We now offer e-Visits for anyone 18 and older to request care online for non-urgent symptoms. For details visit [email or web address].   Also download the MyChart app! Go to the app store, search "MyChart", open the app, select [facility], and log in with your MyChart username and password.  

## 2023-08-10 NOTE — Progress Notes (Signed)
Per [name], PA, platelet levels on today's CBC are inaccurate and OK to treat today without accurate platelet count.  Patient states she took dexamethasone prior to arrival today.  Per PA, pt instructed to hold off taking Revlimid until further notice.  Pt and patient's family member verbalized understanding.

## 2023-08-12 NOTE — Progress Notes (Signed)
[facility] [phone]   [phone]   PROGRESS NOTE   Patient Care Team: [facility], [facility] as PCP - General (Rheumatology)   Hematological/Oncological History # IgG Lambda Multiple Myeloma  06/14/2022: establish care with Zoe [name] due to anemia. Labs showed M protein 4.9, Kappa 7.2, Lambda 10.8, ratio 0.67 07/13/2022: Bmbx showed Lambda restricted plasma cell neoplasm involving approximately 60% of the cellular marrow by IHC on the biopsy.  08/01/2022: Cycle 1 Day 1 of VRd chemotherapy. (Holding revlimid initially) 08/21/2022:  Cycle 2 Day 1 of VRd chemotherapy. (Holding revlimid) 09/04/2022: Cycle 3 Day 1 of VRd chemotherapy. Started revlimid 10/03/2022: Cycle 4 Day 1 of VRd chemotherapy 10/31/2022: Cycle 5 Day 1 of VRd chemotherapy 11/20/2022: Cycle 6 Day 1 of VRd chemotherapy 12/11/2022: Cycle 7 Day 1 of VRd chemotherapy 01/02/2023: Cycle 8 Day 1 of VRd chemotherapy 01/22/2023: Cycle 9 Day 1 of VRd chemotherapy 02/23/2023: Day 1 Cycle 1 of Dara/Rev/Dex 03/23/2023: Day 1 Cycle 2 of Dara/Rev/Dex 05/06/2023: chemotherapy on pause due to chest pain/cardiac/respiratory issues.  05/18/2023: Cycle 3 Day 1 of Dara/Rev/Dex 06/15/2023: Cycle 4 Day 1 of Dara/Rev/Dex 07/13/2023: Cycle 5 Day 1 of Dara/Rev/Dex 08/10/2023: Cycle 6 Day 1 of Dara/Rev/Dex    INTERVAL HISTORY: Zoe [name] 89 y.o. female returns to the clinic today for a follow-up visit accompanied by her daughter-in-law. Her last visit was on 07/27/2023. In the interim, she continued on Dara/Rev/Dex.  [name] reports her energy and appetite are stable She recently completed her Keflex therapy for UTI. She adds that she still have occasional episodes of dysuria and urinary urgency. She is applying topical cream for vaginal yeast infection. She denies nausea, vomiting or abdominal pain. She does bruise easily but denies any overt signs of bleeding including hematochezia or melena.  She denies any fevers, chills, sweats, shortness of breath, chest pain or cough.  She has no other complaints.  A full 10 point ROS is otherwise negative.  MEDICAL HISTORY: Past Medical History:  Diagnosis Date   Arthritis    some - per patient   Breast cancer (HCC)    breast cancer / left    Cataract    bilat    GERD (gastroesophageal reflux disease)    History of kidney stones    Hyperlipidemia    Hypertension    Hypothyroidism    Macular degeneration    Left   S/P TAVR (transcatheter aortic valve replacement) 09/03/2018   23 mm Edwards Sapien 3 transcatheter heart valve placed via percutaneous right transfemoral approach    Severe aortic stenosis    Stress incontinence    Thyroid disease    Tinnitus     ALLERGIES:  is allergic to quinolones, penicillins, and sulfa antibiotics.  MEDICATIONS:  Current Outpatient Medications  Medication Sig Dispense Refill   acetaminophen (TYLENOL) 500 MG tablet Take 500 mg by mouth every 6 (six) hours as needed for moderate pain.     acyclovir (ZOVIRAX) 400 MG tablet Take 1 tablet (400 mg total) by mouth 2 (two) times daily. 60 tablet 3   albuterol (VENTOLIN HFA) 108 (90 Base) MCG/ACT inhaler Inhale 2 puffs into the lungs every 6 (six) hours as needed for wheezing or shortness of breath. 8 g 2   amiodarone (PACERONE) 200 MG tablet Take 1 tablet (200 mg total) by mouth daily. (Patient taking differently: Take 100 mg by mouth daily.) 45 tablet 1   apixaban (ELIQUIS) 5 MG TABS tablet Take 1 tablet (5 mg total)  by mouth 2 (two) times daily. 180 tablet 1   Artificial Tear Solution (SOOTHE XP) SOLN Place 1 drop into both eyes every evening.     Cholecalciferol (VITAMIN D3) 50 MCG (2000 UT) capsule Take 1 capsule (2,000 Units total) by mouth daily.     [employer] VITAMIN B12 1000 MCG tablet TAKE 1 TABLET BY MOUTH EVERY DAY 90 tablet 1   dexamethasone (DECADRON) 4 MG tablet Take 5 tablets (20 mg total) by mouth every 14 (fourteen) days. Take in the morning  with food on treatment days (Patient taking differently: Take 20 mg by mouth every 21 ( twenty-one) days.) 30 tablet 2   esomeprazole (NEXIUM) 20 MG capsule Take 20 mg by mouth daily.     fluconazole (DIFLUCAN) 150 MG tablet Take 1 tablet (150 mg total) by mouth daily. May repeat in 3 days if needed (Patient not taking: Reported on 07/31/2023) 2 tablet 0   lenalidomide (REVLIMID) 10 MG capsule Take 1 capsule (10 mg total) by mouth daily. [employer] Auth # [id number]     Date Obtained 07/25/23 Take 1 capsule daily for 21 days then none for 7 days 21 capsule 0   levothyroxine (SYNTHROID, LEVOTHROID) 100 MCG tablet Take 100 mcg by mouth daily before breakfast.  2   losartan (COZAAR) 25 MG tablet Take 1 tablet (25 mg total) by mouth daily. 30 tablet 2   metoprolol tartrate (LOPRESSOR) 50 MG tablet TAKE 1 TABLET BY MOUTH TWICE A DAY 60 tablet 1   miconazole (MONISTAT 7) 2 % vaginal cream Apply thin layer to external vaginal area nightly 45 g 0   Multiple Vitamins-Minerals (PRESERVISION AREDS) CAPS Take 1 capsule by mouth 2 (two) times daily.     polyethylene glycol (MIRALAX / GLYCOLAX) 17 g packet Take 17 g by mouth daily as needed for mild constipation. (Patient not taking: Reported on 07/31/2023)     pravastatin (PRAVACHOL) 40 MG tablet Take 40 mg by mouth every evening.     sennosides-docusate sodium (SENOKOT-S) 8.6-50 MG tablet Take 1 tablet by mouth daily as needed for constipation.     spironolactone (ALDACTONE) 25 MG tablet Take 1 tablet (25 mg total) by mouth daily. 30 tablet 2   triamcinolone ointment (KENALOG) 0.5 % APPLY TO AFFECTED AREA TWICE A DAY 30 g 0   No current facility-administered medications for this visit.    SURGICAL HISTORY:  Past Surgical History:  Procedure Laterality Date   ABDOMINAL HYSTERECTOMY  1970's   BACK SURGERY     BREAST LUMPECTOMY  12/1998   lumpectomy   CARDIAC CATHETERIZATION     CARDIOVERSION N/A 04/18/2023   Procedure: CARDIOVERSION;  Surgeon: Tessa Lerner, DO;   Location: [facility] INVASIVE CV LAB;  Service: Cardiovascular;  Laterality: N/A;   CARDIOVERSION N/A 04/20/2023   Procedure: CARDIOVERSION;  Surgeon: Yates Decamp, MD;  Location: [facility] INVASIVE CV LAB;  Service: Cardiovascular;  Laterality: N/A;   EYE SURGERY     cataract surgery bilat    INTRAOPERATIVE TRANSTHORACIC ECHOCARDIOGRAM N/A 09/03/2018   Procedure: INTRAOPERATIVE TRANSTHORACIC ECHOCARDIOGRAM;  Surgeon: Kathleene Hazel, MD;  Location: [facility] OR;  Service: Open Heart Surgery;  Laterality: N/A;   KYPHOPLASTY N/A 09/07/2022   Procedure: THORACIC EIGHT KYPHOPLASTY;  Surgeon: Venita Lick, MD;  Location: [facility] OR;  Service: Orthopedics;  Laterality: N/A;  1 hr Local with [facility] 3 C-Bed   LITHOTRIPSY     Right total knee     2018 Dr. Charlann Boxer   RIGHT/LEFT HEART CATH AND CORONARY ANGIOGRAPHY  N/A 08/06/2018   Procedure: RIGHT/LEFT HEART CATH AND CORONARY ANGIOGRAPHY;  Surgeon: Yates Decamp, MD;  Location: [facility] INVASIVE CV LAB;  Service: Cardiovascular;  Laterality: N/A;   THYROIDECTOMY, PARTIAL  1975   TONSILLECTOMY     as a child - patient not sure of exact date   TOTAL KNEE ARTHROPLASTY Left 03/13/2016   Procedure: TOTAL KNEE ARTHROPLASTY;  Surgeon: Durene Romans, MD;  Location: [facility] ORS;  Service: Orthopedics;  Laterality: Left;   TOTAL KNEE ARTHROPLASTY Right 06/18/2017   Procedure: RIGHT TOTAL KNEE ARTHROPLASTY;  Surgeon: Durene Romans, MD;  Location: [facility] ORS;  Service: Orthopedics;  Laterality: Right;   TRANSCATHETER AORTIC VALVE REPLACEMENT, TRANSFEMORAL N/A 09/03/2018   Procedure: TRANSCATHETER AORTIC VALVE REPLACEMENT, TRANSFEMORAL;  Surgeon: Kathleene Hazel, MD;  Location: [facility] OR;  Service: Open Heart Surgery;  Laterality: N/A;   REVIEW OF SYSTEMS:   Constitutional: ( - ) fevers, ( - )  chills , ( - ) night sweats Eyes: ( - ) blurriness of vision, ( - ) double vision, ( - ) watery eyes Ears, nose, mouth, throat, and face: ( - ) mucositis, ( - ) sore throat Respiratory: ( -) cough, ( - )  dyspnea, ( - ) wheezes Cardiovascular: ( - ) palpitation, ( - ) chest discomfort, ( +) lower extremity swelling Gastrointestinal:  ( - ) nausea, ( - ) heartburn, ( - ) change in bowel habits Skin: ( - ) abnormal skin rashes Lymphatics: ( - ) new lymphadenopathy, ( - ) easy bruising Neurological: ( - ) numbness, ( - ) tingling, ( - ) new weaknesses Behavioral/Psych: ( - ) mood change, ( - ) new changes  All other systems were reviewed with the patient and are negative.   PHYSICAL EXAMINATION:  Blood pressure (!) 132/52, pulse (!) 58, resp. rate 15, weight 152 lb 6.4 oz (69.1 kg), SpO2 99%.  ECOG PERFORMANCE STATUS: 1  GENERAL: well appearing elderly Caucasian female, alert, no distress and comfortable SKIN: skin color, texture, turgor are normal, no rashes or significant lesions EYES: conjunctiva are pink and non-injected, sclera clear LUNGS:normal breathing effort. Mild wheezing and crackles in lower lobes b/l.  HEART: irregular rhythm ,normal rate and no murmurs.  Musculoskeletal: no cyanosis of digits and no clubbing  PSYCH: alert & oriented x 3, fluent speech NEURO: no focal motor/sensory deficits    LABORATORY DATA: Lab Results  Component Value Date   WBC 3.8 (L) 08/10/2023   HGB 13.2 08/10/2023   HCT 38.9 08/10/2023   MCV 88.8 08/10/2023   PLT 59 (L) 08/10/2023      Chemistry      Component Value Date/Time   NA 133 (L) 08/10/2023 0932   NA 130 (L) 06/19/2022 1012   K 4.1 08/10/2023 0932   CL 100 08/10/2023 0932   CO2 26 08/10/2023 0932   BUN 27 (H) 08/10/2023 0932   BUN 21 06/19/2022 1012   CREATININE 0.92 08/10/2023 0932      Component Value Date/Time   CALCIUM 8.8 (L) 08/10/2023 0932   ALKPHOS 64 08/10/2023 0932   AST 12 (L) 08/10/2023 0932   ALT 12 08/10/2023 0932   BILITOT 0.6 08/10/2023 0932       RADIOGRAPHIC STUDIES:  DG CHEST PORT 1 VIEW  Result Date: 07/16/2023 CLINICAL DATA:  Near syncope, chest pain. EXAM: PORTABLE CHEST 1 VIEW COMPARISON:   Chest radiographs 04/15/2023. FINDINGS: No consolidation or pulmonary edema. Stable cardiac and mediastinal contours with TAVR in place. Unchanged appearance from prior  vertebral augmentation in the lower thoracic spine. No pleural effusion or pneumothorax. IMPRESSION: No evidence of acute cardiopulmonary disease. Electronically Signed   By: Orvan Falconer M.D.   On: 07/16/2023 16:20     ASSESSMENT/PLAN:  DARIYAH GURSKY is a 89 y.o. female who presents to the clinic for evaluation for IgG lambda multiple myeloma.   # IgG Lambda Multiple Myeloma  --diagnosis of MM confirmed with bone marrow biopsy showing 60% plasma cells and anemia -- Bone survey shows no lytic lesions, kidney function is within normal limits.  --08/01/2022 was Cycle 1 Day 1 of VRd  -Dr. Leonides Schanz discontinued velcade on 02/05/23 due to rash. She is status post day 15 cycle #9.  -Dr. Leonides Schanz changed the care plan to Daratumumab, Revlimid, and decadron. She is scheduled for day 1 cycle #1 today  -Started Cycle 1, Day 1 of Dara/Rev/Dex on 02/23/2023.  Plan:  --Due for Cycle 6 Day 1 of Dara/Rev/Dex today --Labs show white blood cell count white blood cell 3.8, hemoglobin 13.2, MCV 88.8, platelets 59 --last M protein from 07/13/2023 trended down to 0.1 (4.9 prior to start of therapy). Dara IFE shows no M protein detected. Normalized SFLC.  --Will proceed with treatment today without any dose modification. After today's treatment, recommend to transition to maintenance therapy after today with Revlimid 10 mg p.o. daily 21 days on and 7 days off.  --Due to worsening thrombocytopenia, recommend to hold Revlmid temporarily. --Strict precautions for bleeding given.  --RTC in 1 week for lab check.   #Dysuria/increased urinary frequency: --Recently completed Keflex therapy for UTI, still complaining of occasional dysuria and urgency. --Will obtain UA/culture today. If patient cannot provide sample, then advise to follow up with PCP.    #Hypotension/BP/Atrial Fibrillation  --Follows with cardiology, Dr. Jacinto Halim -- Recommendations include metoprolol succinate 25 mg PO daily and  amiodarone 100 mg PO daily --Continue on Eliquis 5 mg BID   #Bilateral lower extremity edema:  --Continue to wear compression stockings.    # Normocytic Anemia #Vitamin B12 Deficiency --Anemia likely driven by multiple myeloma, but may be component of vitamin B12 deficiency as well. --initial labs showed elevated MMA with B12 180 -- Continue vitamin B12 1000 mcg p.o. daily -- Continue to monitor   #Left leg/thigh neuropathy--improving: --Currently on gabapentin 900 mg nightly and 600 mg in the morning --Encouraged to try water aerobics and stretches.  --Following with Dr. Debbe Bales (ortho) to see if she is a candidate for steroid injections.  -- Patient evaluated by Dr. Barbaraann Cao thinks that this may be neuropathy worsened by her Velcade therapy.   #Episode of right UE tremor and speech abnormality: --Evaluated by Dr. Barbaraann Cao on 03/20/2023. Presumed etiology had been atrial fibrillation, but vascular disease, atheromatous changes, are also possibly implicated concurrently.  --MRI brain from 03/13/2023 show no acute finding including no evidence of intracranial neoplastic disease. There are chronic small-vessel ischemic changes of the pons, cerebral hemispheric white matter and right cerebellum. --Educated patient to seek immediate evaluate if there are repeat episodes. --No neurological deficits identified per exam today  #Supportive Care -- chemotherapy education complete.  -- port placement not required.  --Awaiting to start Zometa/Xgeva  -- zofran 8mg  q8H PRN and compazine 10mg  PO q6H for nausea -- acyclovir 400mg  PO BID for VCZ prophylaxis -- tylenol 1000 mg q8H PRN for back pain.   Orders Placed This Encounter  Procedures   Urine Culture    Standing Status:   Future    Standing Expiration Date:  08/09/2024   Urinalysis, Complete w  Microscopic    Standing Status:   Future    Standing Expiration Date:   08/09/2024    Patient expressed understanding of the plan provided.   I have spent a total of 30 minutes minutes of face-to-face and non-face-to-face time, preparing to see the patient, performing a medically appropriate examination, counseling and educating the patient, ordering tests/procedures, documenting clinical information in the electronic health record, and care coordination.   Zoe Kaufmann PA-C Dept of Hematology and Oncology [facility] Phone: [phone]

## 2023-08-13 ENCOUNTER — Telehealth: Payer: Self-pay | Admitting: Hematology and Oncology

## 2023-08-13 LAB — KAPPA/LAMBDA LIGHT CHAINS
Kappa free light chain: 9.1 mg/L (ref 3.3–19.4)
Kappa, lambda light chain ratio: 1.14 (ref 0.26–1.65)
Lambda free light chains: 8 mg/L (ref 5.7–26.3)

## 2023-08-14 ENCOUNTER — Ambulatory Visit: Payer: Medicare Other

## 2023-08-14 DIAGNOSIS — M6281 Muscle weakness (generalized): Secondary | ICD-10-CM

## 2023-08-14 DIAGNOSIS — R2689 Other abnormalities of gait and mobility: Secondary | ICD-10-CM | POA: Diagnosis not present

## 2023-08-14 DIAGNOSIS — C9000 Multiple myeloma not having achieved remission: Secondary | ICD-10-CM

## 2023-08-14 DIAGNOSIS — R262 Difficulty in walking, not elsewhere classified: Secondary | ICD-10-CM

## 2023-08-14 NOTE — Therapy (Signed)
OUTPATIENT PHYSICAL THERAPY  LOWER EXTREMITY ONCOLOGY TREATMENT  Patient Name: JALEESHA BUCKEL MRN: [id number] DOB:11/16/1933, 89 y.o., female Today's Date: 08/14/2023  END OF SESSION:  PT End of Session - 08/14/23 0959     Visit Number 7    Number of Visits 12    Date for PT Re-Evaluation 08/20/23    Authorization Type [employer]    Authorization - Visit Number 7    Authorization - Number of Visits 12    PT Start Time 1002    PT Stop Time 1051    PT Time Calculation (min) 49 min    Equipment Utilized During Treatment Gait belt    Activity Tolerance Patient tolerated treatment well;Patient limited by fatigue    Behavior During Therapy WFL for tasks assessed/performed             Past Medical History:  Diagnosis Date   Arthritis    some - per patient   Breast cancer (HCC)    breast cancer / left    Cataract    bilat    GERD (gastroesophageal reflux disease)    History of kidney stones    Hyperlipidemia    Hypertension    Hypothyroidism    Macular degeneration    Left   S/P TAVR (transcatheter aortic valve replacement) 09/03/2018   23 mm Edwards Sapien 3 transcatheter heart valve placed via percutaneous right transfemoral approach    Severe aortic stenosis    Stress incontinence    Thyroid disease    Tinnitus    Past Surgical History:  Procedure Laterality Date   ABDOMINAL HYSTERECTOMY  1970's   BACK SURGERY     BREAST LUMPECTOMY  12/1998   lumpectomy   CARDIAC CATHETERIZATION     CARDIOVERSION N/A 04/18/2023   Procedure: CARDIOVERSION;  Surgeon: Tessa Lerner, DO;  Location: [facility] INVASIVE CV LAB;  Service: Cardiovascular;  Laterality: N/A;   CARDIOVERSION N/A 04/20/2023   Procedure: CARDIOVERSION;  Surgeon: Yates Decamp, MD;  Location: [facility] INVASIVE CV LAB;  Service: Cardiovascular;  Laterality: N/A;   EYE SURGERY     cataract surgery bilat    INTRAOPERATIVE TRANSTHORACIC ECHOCARDIOGRAM N/A 09/03/2018   Procedure: INTRAOPERATIVE TRANSTHORACIC ECHOCARDIOGRAM;   Surgeon: Kathleene Hazel, MD;  Location: [facility] OR;  Service: Open Heart Surgery;  Laterality: N/A;   KYPHOPLASTY N/A 09/07/2022   Procedure: THORACIC EIGHT KYPHOPLASTY;  Surgeon: Venita Lick, MD;  Location: [facility] OR;  Service: Orthopedics;  Laterality: N/A;  1 hr Local with [facility] 3 C-Bed   LITHOTRIPSY     Right total knee     2018 Dr. Charlann Boxer   RIGHT/LEFT HEART CATH AND CORONARY ANGIOGRAPHY N/A 08/06/2018   Procedure: RIGHT/LEFT HEART CATH AND CORONARY ANGIOGRAPHY;  Surgeon: Yates Decamp, MD;  Location: [facility] INVASIVE CV LAB;  Service: Cardiovascular;  Laterality: N/A;   THYROIDECTOMY, PARTIAL  1975   TONSILLECTOMY     as a child - patient not sure of exact date   TOTAL KNEE ARTHROPLASTY Left 03/13/2016   Procedure: TOTAL KNEE ARTHROPLASTY;  Surgeon: Durene Romans, MD;  Location: [facility] ORS;  Service: Orthopedics;  Laterality: Left;   TOTAL KNEE ARTHROPLASTY Right 06/18/2017   Procedure: RIGHT TOTAL KNEE ARTHROPLASTY;  Surgeon: Durene Romans, MD;  Location: [facility] ORS;  Service: Orthopedics;  Laterality: Right;   TRANSCATHETER AORTIC VALVE REPLACEMENT, TRANSFEMORAL N/A 09/03/2018   Procedure: TRANSCATHETER AORTIC VALVE REPLACEMENT, TRANSFEMORAL;  Surgeon: Kathleene Hazel, MD;  Location: [facility] OR;  Service: Open Heart Surgery;  Laterality: N/A;  Patient Active Problem List   Diagnosis Date Noted   Hypertensive urgency 07/16/2023   Urinary tract infection due to Klebsiella species 07/16/2023   Heart failure with preserved ejection fraction (HCC) 07/16/2023   Hypokalemia 07/16/2023   Hypocalcemia 07/16/2023   Atypical atrial flutter (HCC) 04/19/2023   Acute on chronic diastolic heart failure (HCC) 04/19/2023   Atrial fibrillation with rapid ventricular response (HCC) 04/18/2023   History of stroke 04/16/2023   Fall at home, initial encounter 04/16/2023   Atrial fibrillation (HCC) 04/16/2023   Paroxysmal atrial fibrillation (HCC) 04/15/2023   Elevated troponin 04/15/2023   Chest pain  04/13/2023   Stroke due to embolism (HCC) 03/20/2023   Chest pain, rule out acute myocardial infarction 02/17/2023   Chemotherapy-induced neuropathy (HCC) 11/28/2022   Pain due to onychomycosis of toenails of both feet 11/08/2022   Multiple myeloma not having achieved remission (HCC) 07/24/2022   Chronic diastolic CHF (congestive heart failure) (HCC) 05/16/2022   Symptomatic anemia 05/16/2022   Hyponatremia 04/02/2022   Microcytic anemia 04/02/2022   S/P TAVR (transcatheter aortic valve replacement) 09/03/2018   Hypothyroidism    Hypertension    Hyperlipidemia    GERD (gastroesophageal reflux disease)    S/P right TKA 06/18/2017   Class 1 obesity 03/15/2016   S/P left TKA 03/13/2016   History of breast cancer, DCIS, lumpectomy March 2000 12/13/2011    PCP: Dr. Irena Reichmann  REFERRING PROVIDER: Dr. Jeanie Sewer, IV  REFERRING DIAG: Multiple Myeloma  THERAPY DIAG:  Multiple myeloma without remission (HCC)  Muscle weakness (generalized)  Difficulty walking  Balance problem  ONSET DATE: 07/2022  Rationale for Evaluation and Treatment: Rehabilitation  SUBJECTIVE:                                                                                                                                                                                           SUBJECTIVE STATEMENT:  My legs are getting a little stronger, but its still hard to get up from chairs. Leg pain seems to be improving.  eval I need to get my balance better so I can move without fear of falling. Difficult getting up and down from chairs, Some fatigue with walking short distances;ie car into clinic. Reports some foot numbness if she is cold but feels good with socks on. Her grandson lives with her, but she has a home health aid in the day time and the night time for safety PERTINENT HISTORY:  Pt was diagnosed with  IgGMultiple Myeloma in 07/2022 after a bone Marrow biopsy showed 60% plasma cells and anemia. No lytic  lesions. She was started on chemotherapy. She had surgery  for T 8 compression fracture kyphoplasty on 09/07/2022. She was hospitalized 04/15/2023 for Atrial Fibrillation with cardioversion attempted on 04/18/2023. She had some PT at that time. She has a home health aide that drives her to appts and helps with keeping her house clean And someone at night to help her get to the bathroom  PMH significant for A-fib on Eliquis and amiodarone, hypothyroidism, HTN, breast cancer, multiple myeloma,aortic stenosis status post TAVR, hx of TIA, Macular degeneration, Head aches she thinks related to her eyes PAIN:  Are you having pain? No, Just some tightness in my legs  PRECAUTIONS: PMH significant of A-fib on Eliquis and amiodarone, hypothyroidism, HTN, Left breast cancer, multiple myeloma,aortic stenosis status post TAVR, hx of TIA, T8 compression fx with kyphoplasty  RED FLAGS: Bowel or bladder incontinence: Yes:   and Compression fracture: Yes: had a kyphoplasty    WEIGHT BEARING RESTRICTIONS: No  FALLS:  Has patient fallen in last 6 months? Yes. Number of falls 1, fell in community garden: no injury  LIVING ENVIRONMENT: Lives with: Lucila Maine lives with her presently Lives in: House/apartment Stairs: No;  Has following equipment at home: Single point cane, Environmental consultant - 4 wheeled, and shower chair  OCCUPATION: Retired  LEISURE: read,visit with friends  PRIOR LEVEL OF FUNCTION: Independent with basic ADLs, Independent with household mobility with device, and Needs assistance with homemaking  PATIENT GOALS: Be able to walk more securely, not be dependent on a walker/rollator   OBJECTIVE:  COGNITION: Overall cognitive status: deficits  PALPATION:   OBSERVATIONS / OTHER ASSESSMENTS: Ambulates with rollator, flexed posture/increased kyphosis  SENSATION: Light touch: deficits   POSTURE: forward head, rounded shoulders, increased thoracic kyphosis  LOWER EXTREMITY STRENGTH:  MMT Right eval   Hip flexion 4-  Hip extension Able to bridge  Hip abduction 4-  Hip adduction 4 -seated  Hip internal rotation 3-  Hip external rotation 3+  Knee flexion 4+  Knee extension 4+  Ankle dorsiflexion 5  Ankle plantarflexion   Ankle inversion   Ankle eversion   Great toe extension    (Blank rows = not tested)  MMT LEFT eval  Hip flexion 4  Hip extension Able to bridge  Hip abduction 4-  Hip adduction 4-  Hip internal rotation 3+  Hip external rotation 3  Knee flexion 4+  Knee extension 4  Ankle dorsiflexion 5  Ankle plantarflexion   Ankle inversion   Ankle eversion   Great toe extension     (Blank rows = not tested)  LYMPHEDEMA ASSESSMENTS:   SURGERY TYPE/DATE: Left lumpectomy 2000  NUMBER OF LYMPH NODES REMOVED: Thinks so, not sure how many  CHEMOTHERAPY: Yes for Multiple Myeloma, No for Breast Cancer  RADIATION:No for Breast Cancer  HORMONE TREATMENT: Yes for Breast Cancer  INFECTIONS: NO  LYMPHEDEMA ASSESSMENTS:   LOWER EXTREMITY LANDMARK RIGHT eval  At groin   30 cm proximal to suprapatella   20 cm proximal to suprapatella   10 cm proximal to suprapatella   At midpatella / popliteal crease   30 cm proximal to floor at lateral plantar foot   20 cm proximal to floor at lateral plantar foot   10 cm proximal to floor at lateral plantar foot   Circumference of ankle/heel   5 cm proximal to 1st MTP joint   Across MTP joint   Around proximal great toe   (Blank rows = not tested)  LOWER EXTREMITY LANDMARK LEFT eval  At groin   30 cm proximal to suprapatella  20 cm proximal to suprapatella   10 cm proximal to suprapatella   At midpatella / popliteal crease   30 cm proximal to floor at lateral plantar foot   20 cm proximal to floor at lateral plantar foot   10 cm proximal to floor at lateral plantar foot   Circumference of ankle/heel   5 cm proximal to 1st MTP joint   Across MTP joint   Around proximal great toe   (Blank rows = not  tested)  FUNCTIONAL TESTS:  30 seconds sit to stand test: 5 repetitions with use of UE's  Timed up and go: 30.4 seconds with rollator and use of hands to rise from chair  Four position balance test:  able to maintain DLS feet together x 10 sec, and half tandem with left foot forward x 10 sec. , unable to hold half tandem with right foot forward for greater than 3 seconds, unable to maintain tandem stance bilaterally for more than a few seconds. Did not test SLS GAIT: Distance walked: Nustep to ramp in front of building Assistive device utilized: Rollator Level of assistance: Modified independence Comments:flexed posture, increased kyphosis    Outcome measure:  TODAY'S TREATMENT:                                                                                                                                          DATE:   10/220/2024 HR 69, O2 98% Nu Step;Seat 8, UE 8, Lev 3 x 7 min,522 steps Sit to stand from regular height chair with Min A of PT 2 x 5 LAQ 3# 2 x 10 alternating Half tandem stance x 10 sec or greater B Full tandem x 1 ea High march with HH x 10 ea Low march without HH x 10 6 in step ups bilaterally x 5 ea Foot placement on 6 in step x 10 ea Ambulated 304 feet in clinic with emphasis on erect posture and lengthening stride with supervision    08/09/2023 02 98, HR 63 Nu Step;Seat 8, UE 8, Lev 3 x 6 min In parallel bars: marching with HH x 10, with intermittent HH x 10 CGA of PT Heel raises x 15 with HH, x 10 no HH, CGA of PT  tandem stance x 2 reps each, intermittent hand touchCGA seated rest Sit to stand from chair CGA PT  2 x 5 O2 99, HR 64 afterwards HS Curls x 10 with Red Ambulated to ortho gym and return with a full lap around cancer gym; independent with rollator;VC's for erect posture 6 in step ups x 10 ea leg Mini lunge x 4 ea in parallel bars with HH  08/02/2023 O2 99%, HR 60 Nu Step;Seat 8, UE 8, Lev 2 x 6 min 399steps In parallel bars:  marching with HH x 10, with intermittent HH x 10 CGA of PT Heel raises x 10 with HH, x  10 no HH, CGA of PT Semi tandem and tandem stance x 2-3 reps each, CGA seated rest Sit to stand with purple pad on chair 2 x 5 LAQ 2 x 10 B with 2# Ambulated from parallel bars  cancer end to end of ortho gym; brief rest O2 96%, HR 59 then returned back to parallel bars on Cancer side. Seated rest  07/31/2023 O2 99%, HR 66, BP 117/66 Nu Step;Seat 8, UE 8, Lev 2 x 5 min 334 steps sit to stand from chair with purple cushion 2 x 5, 1 x 4 without cushion with min A PT and VC's for lift In parallel bars: marching with HH x 10, without HH x 10 CGA of PT Heel raises x 10 with HH, x 10 no HH, CGA of PT Semi tandem and tandem stance x 2-3 reps each, CGA seated rest Purple ball squeeze x 10 5 sec hold; emphasis on erect posture Scapular retraction with yellow band x 10 Ambulated from parallel bars to end of ortho gym and return with rollator with emphasis on erect posture, SBA     07/26/2023 Took pts blood pressure 124/60 O2 97, HR 53,  prior to rx Nu Step;Seat 8, UE 8, Lev 2 x 2:30 x 2 reps with rest between sets HR 63 after 2:30 seconds, after seated rest HR 53, O2 100% Heel raises x 15 seated Toe raises x 15 Marching x 15 seated Seated LAQ  2x 10 alternating with 2# B Ball squeeze x 10 Updated HEP with pictures;gave large copy due to Macular degeneration Treatment decreased secondary to pt feeling a little dizzy and fatigued due to sickness the last few weeks and new meds.     07/12/2023 Assessed MMT  B Sit to stand from slightly higher mat table 2 x 5 with emphasis on control and form without use of hands Nu Step;Seat 7, UE 8, Lev 2 x 5 min, 238 steps Heel raises x 10 with HH in bars Marching x 10 with HH, in bars Hip abd x 10 B, HH Seated LAQ  2x 10 alternating with 2# B Ball squeeze x 10 Bridging 2 x 5 Piriformis stretch x 2 ea 20 sec supine Updated HEP with pictures;gave large copy due to  Macular degeneration  EVAL Discussed POC and treatment interventions including, strength, postural education, balance and function   PATIENT EDUCATION:  Access Code: [email or web address] URL: [email or web address] Date: 07/12/2023 Prepared by: Alvira Monday  Exercises - Supine Bridge  - 1 x daily - 7 x weekly - 1 sets - 10 reps - Seated Long Arc Quad  - 1 x daily - 7 x weekly - 1 sets - 10 reps - Standing Hip Abduction with Counter Support  - 1 x daily - 7 x weekly - 1 sets - 10 reps - Seated March  - 1 x daily - 7 x weekly - 1 sets - 10 reps - Standing March with Counter Support  - 1 x daily - 7 x weekly - 1 sets - 10 reps - Standing Heel Raise  - 1 x daily - 7 x weekly - 1 sets - 10 reps - Seated Hip Adduction Isometrics with Ball  - 1 x daily - 7 x weekly - 1 sets - 10 reps - 5 hold Education details: Discussed POC and treatment interventions including, strength, balance and function Person educated: Patient Education method: Explanation Education comprehension: verbalized understanding  HOME EXERCISE PROGRAM:  Supine Bridge  - 1 x daily - 7  x weekly - 1 sets - 10 reps - Seated Long Arc Quad  - 1 x daily - 7 x weekly - 1 sets - 10 reps - Standing Hip Abduction with Counter Support  - 1 x daily - 7 x weekly - 1 sets - 10 reps - Seated March  - 1 x daily - 7 x weekly - 1 sets - 10 reps - Standing March with Counter Support  - 1 x daily - 7 x weekly - 1 sets - 10 reps - Standing Heel Raise  - 1 x daily - 7 x weekly - 1 sets - 10 reps - Seated Hip Adduction Isometrics with Ball  - 1 x daily - 7 x weekly - 1 sets - 10 reps - 5 hold  ASSESSMENT:  CLINICAL IMPRESSION: Pt is improving with sit to stand from regular without UE's but is slightly off balance and needs intermittent min A. She met her goal for half tandem stance with 10 sec hold and HEP goal. She is tolerating longer periods of standing exs without rest and has been increasing her time and steps on the  Nu-Step    OBJECTIVE IMPAIRMENTS: decreased activity tolerance, decreased balance, decreased cognition, decreased endurance, decreased knowledge of condition, difficulty walking, decreased strength, impaired sensation, impaired vision/preception, postural dysfunction, and pain.   ACTIVITY LIMITATIONS: carrying, lifting, standing, squatting, bed mobility, dressing, and locomotion level  PARTICIPATION LIMITATIONS: meal prep, cleaning, laundry, driving, shopping, and community activity  PERSONAL FACTORS: Age and 3+ comorbidities: multiple myeloma,Macular Degeneration , Afib are also affecting patient's functional outcome.   REHAB POTENTIAL: Good  CLINICAL DECISION MAKING: Evolving/moderate complexity  EVALUATION COMPLEXITY: Low   GOALS: Goals reviewed with patient? Yes  SHORT TERM GOALS: Target date: 07/30/2023  Pt will be independent in a HEP for LE/core strength Baseline: Goal status: MET 08/14/2023 2.  Pt will be able to maintain half tandem stance bilaterally x 10 sec Baseline:  Goal status:MET 08/14/2023 3.  Pt will perform 6-7 sit to stands in 30 seconds to decrease fall risk Baseline:  Goal status: INITIAL  4.  Pt will perform TUG in 26 seconds to decrease fall risk Baseline:  Goal status: INITIAL   LONG TERM GOALS: Target date: 08/20/2023  Pt will be able to maintain tandem stance for 10 seconds B to demonstrate improved balance Baseline:  Goal status: INITIAL  2.  Pt will be able to perform 9 sit to stands in 30 seconds for average score to decrease fall risk Baseline:  Goal status: INITIAL  3.  Pt will be able to perform TUG with rollator in 24 seconds or less for improved gait speed and decreased fall risk Baseline:  Goal status: INITIAL  4.  Pt will be independent in more advanced HEP Baseline:  Goal status: INITIAL   PLAN:  PT FREQUENCY: 2x/week  PT DURATION: 6 weeks  PLANNED INTERVENTIONS: Therapeutic exercises, Therapeutic activity,  Neuromuscular re-education, Balance training, Gait training, Patient/Family education, Self Care, and Manual therapy  PLAN FOR NEXT SESSION:  RECERT next, progress  strength,balance, function, gait   Waynette Buttery, PT 08/14/2023, 10:53 AM

## 2023-08-16 ENCOUNTER — Ambulatory Visit: Payer: Medicare Other

## 2023-08-16 DIAGNOSIS — R262 Difficulty in walking, not elsewhere classified: Secondary | ICD-10-CM

## 2023-08-16 DIAGNOSIS — R2689 Other abnormalities of gait and mobility: Secondary | ICD-10-CM | POA: Diagnosis not present

## 2023-08-16 DIAGNOSIS — M6281 Muscle weakness (generalized): Secondary | ICD-10-CM

## 2023-08-16 DIAGNOSIS — C9000 Multiple myeloma not having achieved remission: Secondary | ICD-10-CM

## 2023-08-16 NOTE — Therapy (Signed)
OUTPATIENT PHYSICAL THERAPY  LOWER EXTREMITY ONCOLOGY TREATMENT  Patient Name: Zoe [name] MRN: [id number] DOB:03/14/1934, 89 y.o., female Today's Date: 08/16/2023  END OF SESSION:  PT End of Session - 08/16/23 0959     Visit Number 8    Number of Visits 12    Date for PT Re-Evaluation 09/13/23    Authorization Type [employer]    Authorization Time Period 9/16-12/28/2024    Authorization - Visit Number 8    Authorization - Number of Visits 12    PT Start Time 1001    PT Stop Time 1050    PT Time Calculation (min) 49 min    Equipment Utilized During Treatment Gait belt    Activity Tolerance Patient tolerated treatment well;Patient limited by fatigue    Behavior During Therapy WFL for tasks assessed/performed             Past Medical History:  Diagnosis Date   Arthritis    some - per patient   Breast cancer (HCC)    breast cancer / left    Cataract    bilat    GERD (gastroesophageal reflux disease)    History of kidney stones    Hyperlipidemia    Hypertension    Hypothyroidism    Macular degeneration    Left   S/P TAVR (transcatheter aortic valve replacement) 09/03/2018   23 mm Edwards Sapien 3 transcatheter heart valve placed via percutaneous right transfemoral approach    Severe aortic stenosis    Stress incontinence    Thyroid disease    Tinnitus    Past Surgical History:  Procedure Laterality Date   ABDOMINAL HYSTERECTOMY  1970's   BACK SURGERY     BREAST LUMPECTOMY  12/1998   lumpectomy   CARDIAC CATHETERIZATION     CARDIOVERSION N/A 04/18/2023   Procedure: CARDIOVERSION;  Surgeon: Tessa Lerner, DO;  Location: [facility] INVASIVE CV LAB;  Service: Cardiovascular;  Laterality: N/A;   CARDIOVERSION N/A 04/20/2023   Procedure: CARDIOVERSION;  Surgeon: Yates Decamp, MD;  Location: [facility] INVASIVE CV LAB;  Service: Cardiovascular;  Laterality: N/A;   EYE SURGERY     cataract surgery bilat    INTRAOPERATIVE TRANSTHORACIC ECHOCARDIOGRAM N/A 09/03/2018   Procedure:  INTRAOPERATIVE TRANSTHORACIC ECHOCARDIOGRAM;  Surgeon: Kathleene Hazel, MD;  Location: [facility] OR;  Service: Open Heart Surgery;  Laterality: N/A;   KYPHOPLASTY N/A 09/07/2022   Procedure: THORACIC EIGHT KYPHOPLASTY;  Surgeon: Venita Lick, MD;  Location: [facility] OR;  Service: Orthopedics;  Laterality: N/A;  1 hr Local with [facility] 3 C-Bed   LITHOTRIPSY     Right total knee     2018 Dr. Charlann Boxer   RIGHT/LEFT HEART CATH AND CORONARY ANGIOGRAPHY N/A 08/06/2018   Procedure: RIGHT/LEFT HEART CATH AND CORONARY ANGIOGRAPHY;  Surgeon: Yates Decamp, MD;  Location: [facility] INVASIVE CV LAB;  Service: Cardiovascular;  Laterality: N/A;   THYROIDECTOMY, PARTIAL  1975   TONSILLECTOMY     as a child - patient not sure of exact date   TOTAL KNEE ARTHROPLASTY Left 03/13/2016   Procedure: TOTAL KNEE ARTHROPLASTY;  Surgeon: Durene Romans, MD;  Location: [facility] ORS;  Service: Orthopedics;  Laterality: Left;   TOTAL KNEE ARTHROPLASTY Right 06/18/2017   Procedure: RIGHT TOTAL KNEE ARTHROPLASTY;  Surgeon: Durene Romans, MD;  Location: [facility] ORS;  Service: Orthopedics;  Laterality: Right;   TRANSCATHETER AORTIC VALVE REPLACEMENT, TRANSFEMORAL N/A 09/03/2018   Procedure: TRANSCATHETER AORTIC VALVE REPLACEMENT, TRANSFEMORAL;  Surgeon: Kathleene Hazel, MD;  Location: [facility] OR;  Service: Open  Heart Surgery;  Laterality: N/A;   Patient Active Problem List   Diagnosis Date Noted   Hypertensive urgency 07/16/2023   Urinary tract infection due to Klebsiella species 07/16/2023   Heart failure with preserved ejection fraction (HCC) 07/16/2023   Hypokalemia 07/16/2023   Hypocalcemia 07/16/2023   Atypical atrial flutter (HCC) 04/19/2023   Acute on chronic diastolic heart failure (HCC) 04/19/2023   Atrial fibrillation with rapid ventricular response (HCC) 04/18/2023   History of stroke 04/16/2023   Fall at home, initial encounter 04/16/2023   Atrial fibrillation (HCC) 04/16/2023   Paroxysmal atrial fibrillation (HCC) 04/15/2023    Elevated troponin 04/15/2023   Chest pain 04/13/2023   Stroke due to embolism (HCC) 03/20/2023   Chest pain, rule out acute myocardial infarction 02/17/2023   Chemotherapy-induced neuropathy (HCC) 11/28/2022   Pain due to onychomycosis of toenails of both feet 11/08/2022   Multiple myeloma not having achieved remission (HCC) 07/24/2022   Chronic diastolic CHF (congestive heart failure) (HCC) 05/16/2022   Symptomatic anemia 05/16/2022   Hyponatremia 04/02/2022   Microcytic anemia 04/02/2022   S/P TAVR (transcatheter aortic valve replacement) 09/03/2018   Hypothyroidism    Hypertension    Hyperlipidemia    GERD (gastroesophageal reflux disease)    S/P right TKA 06/18/2017   Class 1 obesity 03/15/2016   S/P left TKA 03/13/2016   History of breast cancer, DCIS, lumpectomy March 2000 12/13/2011    PCP: Dr. Irena Reichmann  REFERRING PROVIDER: Dr. Jeanie Sewer, IV  REFERRING DIAG: Multiple Myeloma  THERAPY DIAG:  Multiple myeloma without remission (HCC)  Muscle weakness (generalized)  Difficulty walking  Balance problem  ONSET DATE: 07/2022  Rationale for Evaluation and Treatment: Rehabilitation  SUBJECTIVE:                                                                                                                                                                                           SUBJECTIVE STATEMENT:  My legs are getting a little stronger, but its still hard to get up from chairs. Leg pain seems to be improving. I don't notice leg pain as much when I am moving around the house, or at night when I am in the bed. I have not been as woozy. I work hard practicing my leg exs at home. I feel like I can walk shorter distances a little easier.  I feel a little stronger and think my balance is a little better.  eval I need to get my balance better so I can move without fear of falling. Difficult getting up and down from chairs, Some fatigue with walking short distances;ie car  into clinic.  Reports some foot numbness if she is cold but feels good with socks on. Her grandson lives with her, but she has a home health aid in the day time and the night time for safety PERTINENT HISTORY:  Pt was diagnosed with  IgGMultiple Myeloma in 07/2022 after a bone Marrow biopsy showed 60% plasma cells and anemia. No lytic lesions. She was started on chemotherapy. She had surgery for T 8 compression fracture kyphoplasty on 09/07/2022. She was hospitalized 04/15/2023 for Atrial Fibrillation with cardioversion attempted on 04/18/2023. She had some PT at that time. She has a home health aide that drives her to appts and helps with keeping her house clean And someone at night to help her get to the bathroom  PMH significant for A-fib on Eliquis and amiodarone, hypothyroidism, HTN, breast cancer, multiple myeloma,aortic stenosis status post TAVR, hx of TIA, Macular degeneration, Head aches she thinks related to her eyes PAIN:  Are you having pain? No, Just some tightness in my legs  PRECAUTIONS: PMH significant of A-fib on Eliquis and amiodarone, hypothyroidism, HTN, Left breast cancer, multiple myeloma,aortic stenosis status post TAVR, hx of TIA, T8 compression fx with kyphoplasty  RED FLAGS: Bowel or bladder incontinence: Yes:   and Compression fracture: Yes: had a kyphoplasty    WEIGHT BEARING RESTRICTIONS: No  FALLS:  Has patient fallen in last 6 months? Yes. Number of falls 1, fell in community garden: no injury  LIVING ENVIRONMENT: Lives with: Lucila Maine lives with her presently Lives in: House/apartment Stairs: No;  Has following equipment at home: Single point cane, Environmental consultant - 4 wheeled, and shower chair  OCCUPATION: Retired  LEISURE: read,visit with friends  PRIOR LEVEL OF FUNCTION: Independent with basic ADLs, Independent with household mobility with device, and Needs assistance with homemaking  PATIENT GOALS: Be able to walk more securely, not be dependent on a  walker/rollator   OBJECTIVE:  COGNITION: Overall cognitive status: deficits  PALPATION:   OBSERVATIONS / OTHER ASSESSMENTS: Ambulates with rollator, flexed posture/increased kyphosis  SENSATION: Light touch: deficits   POSTURE: forward head, rounded shoulders, increased thoracic kyphosis  LOWER EXTREMITY STRENGTH:  MMT Right eval  Hip flexion 4-  Hip extension Able to bridge  Hip abduction 4-  Hip adduction 4 -seated  Hip internal rotation 3-  Hip external rotation 3+  Knee flexion 4+  Knee extension 4+  Ankle dorsiflexion 5  Ankle plantarflexion   Ankle inversion   Ankle eversion   Great toe extension    (Blank rows = not tested)  MMT LEFT eval  Hip flexion 4  Hip extension Able to bridge  Hip abduction 4-  Hip adduction 4-  Hip internal rotation 3+  Hip external rotation 3  Knee flexion 4+  Knee extension 4  Ankle dorsiflexion 5  Ankle plantarflexion   Ankle inversion   Ankle eversion   Great toe extension     (Blank rows = not tested)  LYMPHEDEMA ASSESSMENTS:   SURGERY TYPE/DATE: Left lumpectomy 2000  NUMBER OF LYMPH NODES REMOVED: Thinks so, not sure how many  CHEMOTHERAPY: Yes for Multiple Myeloma, No for Breast Cancer  RADIATION:No for Breast Cancer  HORMONE TREATMENT: Yes for Breast Cancer  INFECTIONS: NO  LYMPHEDEMA ASSESSMENTS:   LOWER EXTREMITY LANDMARK RIGHT eval  At groin   30 cm proximal to suprapatella   20 cm proximal to suprapatella   10 cm proximal to suprapatella   At midpatella / popliteal crease   30 cm proximal to floor at lateral plantar foot  20 cm proximal to floor at lateral plantar foot   10 cm proximal to floor at lateral plantar foot   Circumference of ankle/heel   5 cm proximal to 1st MTP joint   Across MTP joint   Around proximal great toe   (Blank rows = not tested)  LOWER EXTREMITY LANDMARK LEFT eval  At groin   30 cm proximal to suprapatella   20 cm proximal to suprapatella   10 cm proximal to  suprapatella   At midpatella / popliteal crease   30 cm proximal to floor at lateral plantar foot   20 cm proximal to floor at lateral plantar foot   10 cm proximal to floor at lateral plantar foot   Circumference of ankle/heel   5 cm proximal to 1st MTP joint   Across MTP joint   Around proximal great toe   (Blank rows = not tested)  FUNCTIONAL TESTS:  30 seconds sit to stand test: 5 repetitions with use of UE's  Timed up and go: 30.4 seconds with rollator and use of hands to rise from chair  Four position balance test:  able to maintain DLS feet together x 10 sec, and half tandem with left foot forward x 10 sec. , unable to hold half tandem with right foot forward for greater than 3 seconds, unable to maintain tandem stance bilaterally for more than a few seconds. Did not test SLS GAIT: Distance walked: Nustep to ramp in front of building Assistive device utilized: Rollator Level of assistance: Modified independence Comments:flexed posture, increased kyphosis    Outcome measure:  TODAY'S TREATMENT:                                                                                                                                          DATE:   08/16/2023 O2 99, HR 55  30 sec sit to stand  6 with use of hands TUG 26 seonds Nu Step;Seat 8, UE 8, Lev 3 x 7 min,522 steps High march alternating x 10 ea with light HH Low march x 10 ea alternating with no HH Tandem stance Bilaterally x 3 Step ups 6 in x 6 Right and left Sit to stand x 10 with purple ax in chair Foot placement on step x 8 B with no HH to intermittent HH Ambulated 300 ft in clinic with SBA of PT with emphasis on posture and stride length  10/220/2024 HR 69, O2 98% Nu Step;Seat 8, UE 8, Lev 3 x 7 min,522 steps Sit to stand from regular height chair with Min A of PT 2 x 5 LAQ 3# 2 x 10 alternating Half tandem stance x 10 sec or greater B Full tandem x 1 ea High march with HH x 10 ea Low march without HH x  10 6 in step ups bilaterally x 5 ea Foot placement on 6 in step x 10 ea  Ambulated 304 feet in clinic with emphasis on erect posture and lengthening stride with supervision    08/09/2023 02 98, HR 63 Nu Step;Seat 8, UE 8, Lev 3 x 6 min In parallel bars: marching with HH x 10, with intermittent HH x 10 CGA of PT Heel raises x 15 with HH, x 10 no HH, CGA of PT  tandem stance x 2 reps each, intermittent hand touchCGA seated rest Sit to stand from chair CGA PT  2 x 5 O2 99, HR 64 afterwards HS Curls x 10 with Red Ambulated to ortho gym and return with a full lap around cancer gym; independent with rollator;VC's for erect posture 6 in step ups x 10 ea leg Mini lunge x 4 ea in parallel bars with HH  08/02/2023 O2 99%, HR 60 Nu Step;Seat 8, UE 8, Lev 2 x 6 min 399steps In parallel bars: marching with HH x 10, with intermittent HH x 10 CGA of PT Heel raises x 10 with HH, x 10 no HH, CGA of PT Semi tandem and tandem stance x 2-3 reps each, CGA seated rest Sit to stand with purple pad on chair 2 x 5 LAQ 2 x 10 B with 2# Ambulated from parallel bars  cancer end to end of ortho gym; brief rest O2 96%, HR 59 then returned back to parallel bars on Cancer side. Seated rest  07/31/2023 O2 99%, HR 66, BP 117/66 Nu Step;Seat 8, UE 8, Lev 2 x 5 min 334 steps sit to stand from chair with purple cushion 2 x 5, 1 x 4 without cushion with min A PT and VC's for lift In parallel bars: marching with HH x 10, without HH x 10 CGA of PT Heel raises x 10 with HH, x 10 no HH, CGA of PT Semi tandem and tandem stance x 2-3 reps each, CGA seated rest Purple ball squeeze x 10 5 sec hold; emphasis on erect posture Scapular retraction with yellow band x 10 Ambulated from parallel bars to end of ortho gym and return with rollator with emphasis on erect posture, SBA     07/26/2023 Took pts blood pressure 124/60 O2 97, HR 53,  prior to rx Nu Step;Seat 8, UE 8, Lev 2 x 2:30 x 2 reps with rest between sets HR 63  after 2:30 seconds, after seated rest HR 53, O2 100% Heel raises x 15 seated Toe raises x 15 Marching x 15 seated Seated LAQ  2x 10 alternating with 2# B Ball squeeze x 10 Updated HEP with pictures;gave large copy due to Macular degeneration Treatment decreased secondary to pt feeling a little dizzy and fatigued due to sickness the last few weeks and new meds.     07/12/2023 Assessed MMT  B Sit to stand from slightly higher mat table 2 x 5 with emphasis on control and form without use of hands Nu Step;Seat 7, UE 8, Lev 2 x 5 min, 238 steps Heel raises x 10 with HH in bars Marching x 10 with HH, in bars Hip abd x 10 B, HH Seated LAQ  2x 10 alternating with 2# B Ball squeeze x 10 Bridging 2 x 5 Piriformis stretch x 2 ea 20 sec supine Updated HEP with pictures;gave large copy due to Macular degeneration  EVAL Discussed POC and treatment interventions including, strength, postural education, balance and function   PATIENT EDUCATION:  Access Code: [email or web address] URL: [email or web address] Date: 07/12/2023 Prepared by: Alvira Monday  Exercises - Supine Bridge  - 1  x daily - 7 x weekly - 1 sets - 10 reps - Seated Long Arc Quad  - 1 x daily - 7 x weekly - 1 sets - 10 reps - Standing Hip Abduction with Counter Support  - 1 x daily - 7 x weekly - 1 sets - 10 reps - Seated March  - 1 x daily - 7 x weekly - 1 sets - 10 reps - Standing March with Counter Support  - 1 x daily - 7 x weekly - 1 sets - 10 reps - Standing Heel Raise  - 1 x daily - 7 x weekly - 1 sets - 10 reps - Seated Hip Adduction Isometrics with Ball  - 1 x daily - 7 x weekly - 1 sets - 10 reps - 5 hold Education details: Discussed POC and treatment interventions including, strength, balance and function Person educated: Patient Education method: Explanation Education comprehension: verbalized understanding  HOME EXERCISE PROGRAM:  Supine Bridge  - 1 x daily - 7 x weekly - 1 sets - 10 reps - Seated Long Arc  Quad  - 1 x daily - 7 x weekly - 1 sets - 10 reps - Standing Hip Abduction with Counter Support  - 1 x daily - 7 x weekly - 1 sets - 10 reps - Seated March  - 1 x daily - 7 x weekly - 1 sets - 10 reps - Standing March with Counter Support  - 1 x daily - 7 x weekly - 1 sets - 10 reps - Standing Heel Raise  - 1 x daily - 7 x weekly - 1 sets - 10 reps - Seated Hip Adduction Isometrics with Ball  - 1 x daily - 7 x weekly - 1 sets - 10 reps - 5 hold  ASSESSMENT:  CLINICAL IMPRESSION: Pt has achieved all short term goals established an initial evaluation. She was able to decrease her TUG time and increase her sit to stand number both of which help to decrease fall risk. She notices she can walk for slightly longer periods without fatigue, and her legs don't hurt as much as they used to. She is feeling stronger and feels like her balance is improving. She is compliant with her HEP. She still experiences difficulty rising from a regular height chair, but does a great job weight shifting with a 4 in pad on the chair. She will benefit from continued skilled PT to address deficits and return to a more functional lifestyle.   OBJECTIVE IMPAIRMENTS: decreased activity tolerance, decreased balance, decreased cognition, decreased endurance, decreased knowledge of condition, difficulty walking, decreased strength, impaired sensation, impaired vision/preception, postural dysfunction, and pain.   ACTIVITY LIMITATIONS: carrying, lifting, standing, squatting, bed mobility, dressing, and locomotion level  PARTICIPATION LIMITATIONS: meal prep, cleaning, laundry, driving, shopping, and community activity  PERSONAL FACTORS: Age and 3+ comorbidities: multiple myeloma,Macular Degeneration , Afib are also affecting patient's functional outcome.   REHAB POTENTIAL: Good  CLINICAL DECISION MAKING: Evolving/moderate complexity  EVALUATION COMPLEXITY: Low   GOALS: Goals reviewed with patient? Yes  SHORT TERM GOALS:  Target date: 07/30/2023  Pt will be independent in a HEP for LE/core strength Baseline: Goal status: MET 08/14/2023 2.  Pt will be able to maintain half tandem stance bilaterally x 10 sec Baseline:  Goal status:MET 08/14/2023 3.  Pt will perform 6-7 sit to stands in 30 seconds to decrease fall risk Baseline:  Goal status:MET 10/24, 6 reps with hands 4.  Pt will perform TUG in 26  seconds to decrease fall risk Baseline:   Goal status: MET 10/24   LONG TERM GOALS: Target date: 08/20/2023  Pt will be able to maintain tandem stance for 10 seconds B to demonstrate improved balance Baseline:  Goal status: INITIAL  2.  Pt will be able to perform 9 sit to stands in 30 seconds for average score to decrease fall risk Baseline:  Goal status: INITIAL  3.  Pt will be able to perform TUG with rollator in 24 seconds or less for improved gait speed and decreased fall risk Baseline:  Goal status: INITIAL  4.  Pt will be independent in more advanced HEP Baseline:  Goal status: INITIAL   PLAN:  PT FREQUENCY: 2x/week  PT DURATION: 6 weeks  PLANNED INTERVENTIONS: Therapeutic exercises, Therapeutic activity, Neuromuscular re-education, Balance training, Gait training, Patient/Family education, Self Care, and Manual therapy  PLAN FOR NEXT SESSION:  RECERT next, progress  strength,balance, function, gait   Waynette Buttery, PT 08/16/2023, 11:58 AM

## 2023-08-17 ENCOUNTER — Other Ambulatory Visit: Payer: Self-pay

## 2023-08-17 ENCOUNTER — Inpatient Hospital Stay: Payer: Medicare Other

## 2023-08-17 DIAGNOSIS — D649 Anemia, unspecified: Secondary | ICD-10-CM | POA: Diagnosis not present

## 2023-08-17 DIAGNOSIS — G629 Polyneuropathy, unspecified: Secondary | ICD-10-CM | POA: Diagnosis not present

## 2023-08-17 DIAGNOSIS — E538 Deficiency of other specified B group vitamins: Secondary | ICD-10-CM | POA: Diagnosis not present

## 2023-08-17 DIAGNOSIS — R251 Tremor, unspecified: Secondary | ICD-10-CM | POA: Diagnosis not present

## 2023-08-17 DIAGNOSIS — I4891 Unspecified atrial fibrillation: Secondary | ICD-10-CM | POA: Diagnosis not present

## 2023-08-17 DIAGNOSIS — R3915 Urgency of urination: Secondary | ICD-10-CM | POA: Diagnosis not present

## 2023-08-17 DIAGNOSIS — N390 Urinary tract infection, site not specified: Secondary | ICD-10-CM | POA: Diagnosis not present

## 2023-08-17 DIAGNOSIS — R300 Dysuria: Secondary | ICD-10-CM

## 2023-08-17 DIAGNOSIS — Z7901 Long term (current) use of anticoagulants: Secondary | ICD-10-CM | POA: Diagnosis not present

## 2023-08-17 DIAGNOSIS — Z5112 Encounter for antineoplastic immunotherapy: Secondary | ICD-10-CM | POA: Diagnosis not present

## 2023-08-17 DIAGNOSIS — D696 Thrombocytopenia, unspecified: Secondary | ICD-10-CM

## 2023-08-17 DIAGNOSIS — C9000 Multiple myeloma not having achieved remission: Secondary | ICD-10-CM

## 2023-08-17 DIAGNOSIS — R350 Frequency of micturition: Secondary | ICD-10-CM | POA: Diagnosis not present

## 2023-08-17 LAB — URINALYSIS, COMPLETE (UACMP) WITH MICROSCOPIC
Bacteria, UA: NONE SEEN
Bilirubin Urine: NEGATIVE
Glucose, UA: NEGATIVE mg/dL
Hgb urine dipstick: NEGATIVE
Ketones, ur: NEGATIVE mg/dL
Leukocytes,Ua: NEGATIVE
Nitrite: NEGATIVE
Protein, ur: NEGATIVE mg/dL
Specific Gravity, Urine: 1.017 (ref 1.005–1.030)
pH: 5 (ref 5.0–8.0)

## 2023-08-17 LAB — CMP (CANCER CENTER ONLY)
ALT: 13 U/L (ref 0–44)
AST: 12 U/L — ABNORMAL LOW (ref 15–41)
Albumin: 3.5 g/dL (ref 3.5–5.0)
Alkaline Phosphatase: 69 U/L (ref 38–126)
Anion gap: 6 (ref 5–15)
BUN: 26 mg/dL — ABNORMAL HIGH (ref 8–23)
CO2: 27 mmol/L (ref 22–32)
Calcium: 8.8 mg/dL — ABNORMAL LOW (ref 8.9–10.3)
Chloride: 101 mmol/L (ref 98–111)
Creatinine: 0.93 mg/dL (ref 0.44–1.00)
GFR, Estimated: 59 mL/min — ABNORMAL LOW (ref >60)
Glucose, Bld: 108 mg/dL — ABNORMAL HIGH (ref 70–99)
Potassium: 4.2 mmol/L (ref 3.5–5.1)
Sodium: 134 mmol/L — ABNORMAL LOW (ref 135–145)
Total Bilirubin: 0.4 mg/dL (ref 0.3–1.2)
Total Protein: 5.5 g/dL — ABNORMAL LOW (ref 6.5–8.1)

## 2023-08-17 LAB — CBC WITH DIFFERENTIAL (CANCER CENTER ONLY)
Abs Immature Granulocytes: 0.01 10*3/uL (ref 0.00–0.07)
Basophils Absolute: 0 10*3/uL (ref 0.0–0.1)
Basophils Relative: 1 %
Eosinophils Absolute: 0.1 10*3/uL (ref 0.0–0.5)
Eosinophils Relative: 2 %
HCT: 39.4 % (ref 36.0–46.0)
Hemoglobin: 13.3 g/dL (ref 12.0–15.0)
Immature Granulocytes: 0 %
Lymphocytes Relative: 22 %
Lymphs Abs: 0.6 10*3/uL — ABNORMAL LOW (ref 0.7–4.0)
MCH: 30.3 pg (ref 26.0–34.0)
MCHC: 33.8 g/dL (ref 30.0–36.0)
MCV: 89.7 fL (ref 80.0–100.0)
Monocytes Absolute: 0.9 10*3/uL (ref 0.1–1.0)
Monocytes Relative: 30 %
Neutro Abs: 1.3 10*3/uL — ABNORMAL LOW (ref 1.7–7.7)
Neutrophils Relative %: 45 %
Platelet Count: 113 10*3/uL — ABNORMAL LOW (ref 150–400)
RBC: 4.39 MIL/uL (ref 3.87–5.11)
RDW: 17.8 % — ABNORMAL HIGH (ref 11.5–15.5)
WBC Count: 3 10*3/uL — ABNORMAL LOW (ref 4.0–10.5)
nRBC: 0 % (ref 0.0–0.2)

## 2023-08-17 LAB — MULTIPLE MYELOMA PANEL, SERUM
Albumin SerPl Elph-Mcnc: 3.1 g/dL (ref 2.9–4.4)
Albumin/Glob SerPl: 1.7 (ref 0.7–1.7)
Alpha 1: 0.3 g/dL (ref 0.0–0.4)
Alpha2 Glob SerPl Elph-Mcnc: 0.7 g/dL (ref 0.4–1.0)
B-Globulin SerPl Elph-Mcnc: 0.7 g/dL (ref 0.7–1.3)
Gamma Glob SerPl Elph-Mcnc: 0.3 g/dL — ABNORMAL LOW (ref 0.4–1.8)
Globulin, Total: 1.9 g/dL — ABNORMAL LOW (ref 2.2–3.9)
IgA: 33 mg/dL — ABNORMAL LOW (ref 64–422)
IgG (Immunoglobin G), Serum: 350 mg/dL — ABNORMAL LOW (ref 586–1602)
IgM (Immunoglobulin M), Srm: 17 mg/dL — ABNORMAL LOW (ref 26–217)
M Protein SerPl Elph-Mcnc: 0.1 g/dL — ABNORMAL HIGH
Total Protein ELP: 5 g/dL — ABNORMAL LOW (ref 6.0–8.5)

## 2023-08-17 LAB — LACTATE DEHYDROGENASE: LDH: 189 U/L (ref 98–192)

## 2023-08-18 LAB — URINE CULTURE: Culture: <10000 — AB

## 2023-08-20 ENCOUNTER — Telehealth: Payer: Self-pay | Admitting: *Deleted

## 2023-08-20 ENCOUNTER — Telehealth: Payer: Self-pay

## 2023-08-20 ENCOUNTER — Ambulatory Visit: Payer: Medicare Other

## 2023-08-20 DIAGNOSIS — C9000 Multiple myeloma not having achieved remission: Secondary | ICD-10-CM

## 2023-08-20 DIAGNOSIS — M6281 Muscle weakness (generalized): Secondary | ICD-10-CM

## 2023-08-20 DIAGNOSIS — R262 Difficulty in walking, not elsewhere classified: Secondary | ICD-10-CM

## 2023-08-20 DIAGNOSIS — R2689 Other abnormalities of gait and mobility: Secondary | ICD-10-CM | POA: Diagnosis not present

## 2023-08-20 NOTE — Telephone Encounter (Signed)
Pt advised UA and culture were negative for UTI

## 2023-08-20 NOTE — Telephone Encounter (Signed)
Received vm message from pt inquiring about urinalysis and repeated labs Attempted call back. No answer and no vm available.  Will try later this morning.

## 2023-08-20 NOTE — Therapy (Signed)
OUTPATIENT PHYSICAL THERAPY  LOWER EXTREMITY ONCOLOGY TREATMENT  Patient Name: Zoe [name] MRN: [id number] DOB:1934/03/05, 89 y.o., female Today's Date: 08/20/2023  END OF SESSION:  PT End of Session - 08/20/23 1051     Visit Number 8    Number of Visits 16    Date for PT Re-Evaluation 09/13/23    Authorization Type [employer]    Authorization Time Period 9/16-12/28/2024    Authorization - Visit Number 9    Authorization - Number of Visits 12    PT Start Time 1100    PT Stop Time 1155    PT Time Calculation (min) 55 min    Equipment Utilized During Treatment Gait belt    Activity Tolerance Patient tolerated treatment well;Patient limited by fatigue    Behavior During Therapy WFL for tasks assessed/performed              Past Medical History:  Diagnosis Date   Arthritis    some - per patient   Breast cancer (HCC)    breast cancer / left    Cataract    bilat    GERD (gastroesophageal reflux disease)    History of kidney stones    Hyperlipidemia    Hypertension    Hypothyroidism    Macular degeneration    Left   S/P TAVR (transcatheter aortic valve replacement) 09/03/2018   23 mm Edwards Sapien 3 transcatheter heart valve placed via percutaneous right transfemoral approach    Severe aortic stenosis    Stress incontinence    Thyroid disease    Tinnitus    Past Surgical History:  Procedure Laterality Date   ABDOMINAL HYSTERECTOMY  1970's   BACK SURGERY     BREAST LUMPECTOMY  12/1998   lumpectomy   CARDIAC CATHETERIZATION     CARDIOVERSION N/A 04/18/2023   Procedure: CARDIOVERSION;  Surgeon: Tessa Lerner, DO;  Location: [facility] INVASIVE CV LAB;  Service: Cardiovascular;  Laterality: N/A;   CARDIOVERSION N/A 04/20/2023   Procedure: CARDIOVERSION;  Surgeon: Yates Decamp, MD;  Location: [facility] INVASIVE CV LAB;  Service: Cardiovascular;  Laterality: N/A;   EYE SURGERY     cataract surgery bilat    INTRAOPERATIVE TRANSTHORACIC ECHOCARDIOGRAM N/A 09/03/2018   Procedure:  INTRAOPERATIVE TRANSTHORACIC ECHOCARDIOGRAM;  Surgeon: Kathleene Hazel, MD;  Location: [facility] OR;  Service: Open Heart Surgery;  Laterality: N/A;   KYPHOPLASTY N/A 09/07/2022   Procedure: THORACIC EIGHT KYPHOPLASTY;  Surgeon: Venita Lick, MD;  Location: [facility] OR;  Service: Orthopedics;  Laterality: N/A;  1 hr Local with [facility] 3 C-Bed   LITHOTRIPSY     Right total knee     2018 Dr. Charlann Boxer   RIGHT/LEFT HEART CATH AND CORONARY ANGIOGRAPHY N/A 08/06/2018   Procedure: RIGHT/LEFT HEART CATH AND CORONARY ANGIOGRAPHY;  Surgeon: Yates Decamp, MD;  Location: [facility] INVASIVE CV LAB;  Service: Cardiovascular;  Laterality: N/A;   THYROIDECTOMY, PARTIAL  1975   TONSILLECTOMY     as a child - patient not sure of exact date   TOTAL KNEE ARTHROPLASTY Left 03/13/2016   Procedure: TOTAL KNEE ARTHROPLASTY;  Surgeon: Durene Romans, MD;  Location: [facility] ORS;  Service: Orthopedics;  Laterality: Left;   TOTAL KNEE ARTHROPLASTY Right 06/18/2017   Procedure: RIGHT TOTAL KNEE ARTHROPLASTY;  Surgeon: Durene Romans, MD;  Location: [facility] ORS;  Service: Orthopedics;  Laterality: Right;   TRANSCATHETER AORTIC VALVE REPLACEMENT, TRANSFEMORAL N/A 09/03/2018   Procedure: TRANSCATHETER AORTIC VALVE REPLACEMENT, TRANSFEMORAL;  Surgeon: Kathleene Hazel, MD;  Location: [facility] OR;  Service:  Open Heart Surgery;  Laterality: N/A;   Patient Active Problem List   Diagnosis Date Noted   Hypertensive urgency 07/16/2023   Urinary tract infection due to Klebsiella species 07/16/2023   Heart failure with preserved ejection fraction (HCC) 07/16/2023   Hypokalemia 07/16/2023   Hypocalcemia 07/16/2023   Atypical atrial flutter (HCC) 04/19/2023   Acute on chronic diastolic heart failure (HCC) 04/19/2023   Atrial fibrillation with rapid ventricular response (HCC) 04/18/2023   History of stroke 04/16/2023   Fall at home, initial encounter 04/16/2023   Atrial fibrillation (HCC) 04/16/2023   Paroxysmal atrial fibrillation (HCC) 04/15/2023    Elevated troponin 04/15/2023   Chest pain 04/13/2023   Stroke due to embolism (HCC) 03/20/2023   Chest pain, rule out acute myocardial infarction 02/17/2023   Chemotherapy-induced neuropathy (HCC) 11/28/2022   Pain due to onychomycosis of toenails of both feet 11/08/2022   Multiple myeloma not having achieved remission (HCC) 07/24/2022   Chronic diastolic CHF (congestive heart failure) (HCC) 05/16/2022   Symptomatic anemia 05/16/2022   Hyponatremia 04/02/2022   Microcytic anemia 04/02/2022   S/P TAVR (transcatheter aortic valve replacement) 09/03/2018   Hypothyroidism    Hypertension    Hyperlipidemia    GERD (gastroesophageal reflux disease)    S/P right TKA 06/18/2017   Class 1 obesity 03/15/2016   S/P left TKA 03/13/2016   History of breast cancer, DCIS, lumpectomy March 2000 12/13/2011    PCP: Dr. Irena Reichmann  REFERRING PROVIDER: Dr. Jeanie Sewer, IV  REFERRING DIAG: Multiple Myeloma  THERAPY DIAG:  Multiple myeloma without remission (HCC)  Muscle weakness (generalized)  Difficulty walking  Balance problem  ONSET DATE: 07/2022  Rationale for Evaluation and Treatment: Rehabilitation  SUBJECTIVE:                                                                                                                                                                                           SUBJECTIVE STATEMENT:  I did well after my last visit. My inner thighs were a little sore after last visit.   eval I need to get my balance better so I can move without fear of falling. Difficult getting up and down from chairs, Some fatigue with walking short distances;ie car into clinic. Reports some foot numbness if she is cold but feels good with socks on. Her grandson lives with her, but she has a home health aid in the day time and the night time for safety PERTINENT HISTORY:  Pt was diagnosed with  IgGMultiple Myeloma in 07/2022 after a bone Marrow biopsy showed 60% plasma cells and  anemia. No lytic lesions. She was started on  chemotherapy. She had surgery for T 8 compression fracture kyphoplasty on 09/07/2022. She was hospitalized 04/15/2023 for Atrial Fibrillation with cardioversion attempted on 04/18/2023. She had some PT at that time. She has a home health aide that drives her to appts and helps with keeping her house clean And someone at night to help her get to the bathroom  PMH significant for A-fib on Eliquis and amiodarone, hypothyroidism, HTN, breast cancer, multiple myeloma,aortic stenosis status post TAVR, hx of TIA, Macular degeneration, Head aches she thinks related to her eyes PAIN:  Are you having pain? No, and my legs feel good  PRECAUTIONS: PMH significant of A-fib on Eliquis and amiodarone, hypothyroidism, HTN, Left breast cancer, multiple myeloma,aortic stenosis status post TAVR, hx of TIA, T8 compression fx with kyphoplasty  RED FLAGS: Bowel or bladder incontinence: Yes:   and Compression fracture: Yes: had a kyphoplasty    WEIGHT BEARING RESTRICTIONS: No  FALLS:  Has patient fallen in last 6 months? Yes. Number of falls 1, fell in community garden: no injury  LIVING ENVIRONMENT: Lives with: Lucila Maine lives with her presently Lives in: House/apartment Stairs: No;  Has following equipment at home: Single point cane, Environmental consultant - 4 wheeled, and shower chair  OCCUPATION: Retired  LEISURE: read,visit with friends  PRIOR LEVEL OF FUNCTION: Independent with basic ADLs, Independent with household mobility with device, and Needs assistance with homemaking  PATIENT GOALS: Be able to walk more securely, not be dependent on a walker/rollator   OBJECTIVE:  COGNITION: Overall cognitive status: deficits  PALPATION:   OBSERVATIONS / OTHER ASSESSMENTS: Ambulates with rollator, flexed posture/increased kyphosis  SENSATION: Light touch: deficits   POSTURE: forward head, rounded shoulders, increased thoracic kyphosis  LOWER EXTREMITY STRENGTH:  MMT  Right eval  Hip flexion 4-  Hip extension Able to bridge  Hip abduction 4-  Hip adduction 4 -seated  Hip internal rotation 3-  Hip external rotation 3+  Knee flexion 4+  Knee extension 4+  Ankle dorsiflexion 5  Ankle plantarflexion   Ankle inversion   Ankle eversion   Great toe extension    (Blank rows = not tested)  MMT LEFT eval  Hip flexion 4  Hip extension Able to bridge  Hip abduction 4-  Hip adduction 4-  Hip internal rotation 3+  Hip external rotation 3  Knee flexion 4+  Knee extension 4  Ankle dorsiflexion 5  Ankle plantarflexion   Ankle inversion   Ankle eversion   Great toe extension     (Blank rows = not tested)  LYMPHEDEMA ASSESSMENTS:   SURGERY TYPE/DATE: Left lumpectomy 2000  NUMBER OF LYMPH NODES REMOVED: Thinks so, not sure how many  CHEMOTHERAPY: Yes for Multiple Myeloma, No for Breast Cancer  RADIATION:No for Breast Cancer  HORMONE TREATMENT: Yes for Breast Cancer  INFECTIONS: NO  LYMPHEDEMA ASSESSMENTS:   LOWER EXTREMITY LANDMARK RIGHT eval  At groin   30 cm proximal to suprapatella   20 cm proximal to suprapatella   10 cm proximal to suprapatella   At midpatella / popliteal crease   30 cm proximal to floor at lateral plantar foot   20 cm proximal to floor at lateral plantar foot   10 cm proximal to floor at lateral plantar foot   Circumference of ankle/heel   5 cm proximal to 1st MTP joint   Across MTP joint   Around proximal great toe   (Blank rows = not tested)  LOWER EXTREMITY LANDMARK LEFT eval  At groin   30 cm proximal  to suprapatella   20 cm proximal to suprapatella   10 cm proximal to suprapatella   At midpatella / popliteal crease   30 cm proximal to floor at lateral plantar foot   20 cm proximal to floor at lateral plantar foot   10 cm proximal to floor at lateral plantar foot   Circumference of ankle/heel   5 cm proximal to 1st MTP joint   Across MTP joint   Around proximal great toe   (Blank rows = not  tested)  FUNCTIONAL TESTS:  30 seconds sit to stand test: 5 repetitions with use of UE's  Timed up and go: 30.4 seconds with rollator and use of hands to rise from chair  Four position balance test:  able to maintain DLS feet together x 10 sec, and half tandem with left foot forward x 10 sec. , unable to hold half tandem with right foot forward for greater than 3 seconds, unable to maintain tandem stance bilaterally for more than a few seconds. Did not test SLS GAIT: Distance walked: Nustep to ramp in front of building Assistive device utilized: Rollator Level of assistance: Modified independence Comments:flexed posture, increased kyphosis    Outcome measure:  TODAY'S TREATMENT:                                                                                                                                          DATE:   08/20/2023 O2 99, HR 67 Nu Step seat 8, UE 8, Lev 3 x 8 min, 578 steps High march alternating x 10 ea with light HH Low march x 10 ea alternating with no HH Foot placement on step x 10 B with no HH to intermittent HH forward and sideways 6 in step ups x 10 B with HH Tandem stance x 5 B to failure in parallel bars Lunges x 10 B with hand hold Ambulate 290 feet with rollator with Supervision emphasis on stride length and posture  08/16/2023 O2 99, HR 55 30 sec sit to stand  6 with use of hands TUG 26 seonds Nu Step;Seat 8, UE 8, Lev 3 x 7 min,522 steps High march alternating x 10 ea with light HH Low march x 10 ea alternating with no HH Tandem stance Bilaterally x 3 Step ups 6 in x 6 Right and left Sit to stand x 10 with purple ax in chair Foot placement on step x 8 B with no HH to intermittent HH Ambulated 300 ft in clinic with SBA of PT with emphasis on posture and stride length  10/220/2024 HR 69, O2 98% Nu Step;Seat 8, UE 8, Lev 3 x 7 min,522 steps Sit to stand from regular height chair with Min A of PT 2 x 5 LAQ 3# 2 x 10 alternating Half tandem  stance x 10 sec or greater B Full tandem x 1 ea High march with HH x  10 ea Low march without HH x 10 6 in step ups bilaterally x 5 ea Foot placement on 6 in step x 10 ea Ambulated 304 feet in clinic with emphasis on erect posture and lengthening stride with supervision    08/09/2023 02 98, HR 63 Nu Step;Seat 8, UE 8, Lev 3 x 6 min In parallel bars: marching with HH x 10, with intermittent HH x 10 CGA of PT Heel raises x 15 with HH, x 10 no HH, CGA of PT  tandem stance x 2 reps each, intermittent hand touchCGA seated rest Sit to stand from chair CGA PT  2 x 5 O2 99, HR 64 afterwards HS Curls x 10 with Red Ambulated to ortho gym and return with a full lap around cancer gym; independent with rollator;VC's for erect posture 6 in step ups x 10 ea leg Mini lunge x 4 ea in parallel bars with HH  08/02/2023 O2 99%, HR 60 Nu Step;Seat 8, UE 8, Lev 2 x 6 min 399steps In parallel bars: marching with HH x 10, with intermittent HH x 10 CGA of PT Heel raises x 10 with HH, x 10 no HH, CGA of PT Semi tandem and tandem stance x 2-3 reps each, CGA seated rest Sit to stand with purple pad on chair 2 x 5 LAQ 2 x 10 B with 2# Ambulated from parallel bars  cancer end to end of ortho gym; brief rest O2 96%, HR 59 then returned back to parallel bars on Cancer side. Seated rest  07/31/2023 O2 99%, HR 66, BP 117/66 Nu Step;Seat 8, UE 8, Lev 2 x 5 min 334 steps sit to stand from chair with purple cushion 2 x 5, 1 x 4 without cushion with min A PT and VC's for lift In parallel bars: marching with HH x 10, without HH x 10 CGA of PT Heel raises x 10 with HH, x 10 no HH, CGA of PT Semi tandem and tandem stance x 2-3 reps each, CGA seated rest Purple ball squeeze x 10 5 sec hold; emphasis on erect posture Scapular retraction with yellow band x 10 Ambulated from parallel bars to end of ortho gym and return with rollator with emphasis on erect posture, SBA     07/26/2023 Took pts blood pressure 124/60 O2  97, HR 53,  prior to rx Nu Step;Seat 8, UE 8, Lev 2 x 2:30 x 2 reps with rest between sets HR 63 after 2:30 seconds, after seated rest HR 53, O2 100% Heel raises x 15 seated Toe raises x 15 Marching x 15 seated Seated LAQ  2x 10 alternating with 2# B Ball squeeze x 10 Updated HEP with pictures;gave large copy due to Macular degeneration Treatment decreased secondary to pt feeling a little dizzy and fatigued due to sickness the last few weeks and new meds.     07/12/2023 Assessed MMT  B Sit to stand from slightly higher mat table 2 x 5 with emphasis on control and form without use of hands Nu Step;Seat 7, UE 8, Lev 2 x 5 min, 238 steps Heel raises x 10 with HH in bars Marching x 10 with HH, in bars Hip abd x 10 B, HH Seated LAQ  2x 10 alternating with 2# B Ball squeeze x 10 Bridging 2 x 5 Piriformis stretch x 2 ea 20 sec supine Updated HEP with pictures;gave large copy due to Macular degeneration  EVAL Discussed POC and treatment interventions including, strength, postural education, balance and  function   PATIENT EDUCATION:  Access Code: [email or web address] URL: [email or web address] Date: 07/12/2023 Prepared by: Alvira Monday  Exercises - Supine Bridge  - 1 x daily - 7 x weekly - 1 sets - 10 reps - Seated Long Arc Quad  - 1 x daily - 7 x weekly - 1 sets - 10 reps - Standing Hip Abduction with Counter Support  - 1 x daily - 7 x weekly - 1 sets - 10 reps - Seated March  - 1 x daily - 7 x weekly - 1 sets - 10 reps - Standing March with Counter Support  - 1 x daily - 7 x weekly - 1 sets - 10 reps - Standing Heel Raise  - 1 x daily - 7 x weekly - 1 sets - 10 reps - Seated Hip Adduction Isometrics with Ball  - 1 x daily - 7 x weekly - 1 sets - 10 reps - 5 hold Education details: Discussed POC and treatment interventions including, strength, balance and function Person educated: Patient Education method: Explanation Education comprehension: verbalized understanding  HOME  EXERCISE PROGRAM:  Supine Bridge  - 1 x daily - 7 x weekly - 1 sets - 10 reps - Seated Long Arc Quad  - 1 x daily - 7 x weekly - 1 sets - 10 reps - Standing Hip Abduction with Counter Support  - 1 x daily - 7 x weekly - 1 sets - 10 reps - Seated March  - 1 x daily - 7 x weekly - 1 sets - 10 reps - Standing March with Counter Support  - 1 x daily - 7 x weekly - 1 sets - 10 reps - Standing Heel Raise  - 1 x daily - 7 x weekly - 1 sets - 10 reps - Seated Hip Adduction Isometrics with Ball  - 1 x daily - 7 x weekly - 1 sets - 10 reps - 5 hold  ASSESSMENT:  CLINICAL IMPRESSION: Pt felt good after exercising but her legs were fatigued. Improving with wt shifting for step ups. A little slower today with ambulation due to fatigue. Balance continues to require practice.    OBJECTIVE IMPAIRMENTS: decreased activity tolerance, decreased balance, decreased cognition, decreased endurance, decreased knowledge of condition, difficulty walking, decreased strength, impaired sensation, impaired vision/preception, postural dysfunction, and pain.   ACTIVITY LIMITATIONS: carrying, lifting, standing, squatting, bed mobility, dressing, and locomotion level  PARTICIPATION LIMITATIONS: meal prep, cleaning, laundry, driving, shopping, and community activity  PERSONAL FACTORS: Age and 3+ comorbidities: multiple myeloma,Macular Degeneration , Afib are also affecting patient's functional outcome.   REHAB POTENTIAL: Good  CLINICAL DECISION MAKING: Evolving/moderate complexity  EVALUATION COMPLEXITY: Low   GOALS: Goals reviewed with patient? Yes  SHORT TERM GOALS: Target date: 07/30/2023  Pt will be independent in a HEP for LE/core strength Baseline: Goal status: MET 08/14/2023 2.  Pt will be able to maintain half tandem stance bilaterally x 10 sec Baseline:  Goal status:MET 08/14/2023 3.  Pt will perform 6-7 sit to stands in 30 seconds to decrease fall risk Baseline:  Goal status:MET 10/24, 6 reps with  hands 4.  Pt will perform TUG in 26 seconds to decrease fall risk Baseline:   Goal status: MET 10/24   LONG TERM GOALS: Target date: 08/20/2023  Pt will be able to maintain tandem stance for 10 seconds B to demonstrate improved balance Baseline:  Goal status: INITIAL  2.  Pt will be able to perform 9 sit to stands in 30 seconds  for average score to decrease fall risk Baseline:  Goal status: INITIAL  3.  Pt will be able to perform TUG with rollator in 24 seconds or less for improved gait speed and decreased fall risk Baseline:  Goal status: INITIAL  4.  Pt will be independent in more advanced HEP Baseline:  Goal status: INITIAL   PLAN:  PT FREQUENCY: 2x/week  PT DURATION: 6 weeks  PLANNED INTERVENTIONS: Therapeutic exercises, Therapeutic activity, Neuromuscular re-education, Balance training, Gait training, Patient/Family education, Self Care, and Manual therapy  PLAN FOR NEXT SESSION:  progress  strength,balance, function, gait   Waynette Buttery, PT 08/20/2023, 12:00 PM

## 2023-08-20 NOTE — Telephone Encounter (Signed)
-----   Message from Briant Cedar sent at 08/17/2023  3:55 PM EDT ----- Please follow up on her urine studies/culture. So far no UTI seen. ----- Message ----- From: Leory Plowman, [facility] [place] Sent: 08/17/2023   9:56 AM EDT To: Briant Cedar, PA-C

## 2023-08-22 ENCOUNTER — Encounter (INDEPENDENT_AMBULATORY_CARE_PROVIDER_SITE_OTHER): Payer: Medicare Other | Admitting: Ophthalmology

## 2023-08-22 DIAGNOSIS — H35372 Puckering of macula, left eye: Secondary | ICD-10-CM | POA: Diagnosis not present

## 2023-08-22 DIAGNOSIS — I10 Essential (primary) hypertension: Secondary | ICD-10-CM

## 2023-08-22 DIAGNOSIS — H43813 Vitreous degeneration, bilateral: Secondary | ICD-10-CM | POA: Diagnosis not present

## 2023-08-22 DIAGNOSIS — H35033 Hypertensive retinopathy, bilateral: Secondary | ICD-10-CM | POA: Diagnosis not present

## 2023-08-22 DIAGNOSIS — H353231 Exudative age-related macular degeneration, bilateral, with active choroidal neovascularization: Secondary | ICD-10-CM | POA: Diagnosis not present

## 2023-08-23 ENCOUNTER — Ambulatory Visit: Payer: Medicare Other | Admitting: Nurse Practitioner

## 2023-08-23 ENCOUNTER — Other Ambulatory Visit: Payer: Medicare Other

## 2023-08-23 ENCOUNTER — Ambulatory Visit: Payer: Medicare Other

## 2023-08-24 ENCOUNTER — Other Ambulatory Visit: Payer: Self-pay | Admitting: *Deleted

## 2023-08-24 MED ORDER — LENALIDOMIDE 10 MG PO CAPS: 10.0000 mg | ORAL_CAPSULE | Freq: Every day | ORAL | 0 refills | Status: DC

## 2023-08-28 ENCOUNTER — Other Ambulatory Visit: Payer: Self-pay | Admitting: *Deleted

## 2023-08-28 ENCOUNTER — Telehealth: Payer: Self-pay | Admitting: *Deleted

## 2023-08-28 DIAGNOSIS — D696 Thrombocytopenia, unspecified: Secondary | ICD-10-CM

## 2023-08-28 DIAGNOSIS — C9000 Multiple myeloma not having achieved remission: Secondary | ICD-10-CM

## 2023-08-28 NOTE — Telephone Encounter (Signed)
Zoe [name] called to ask when she should start taking her cancer medication. She said she'd been told to hold it for some blood tests. While on the phone, she confirmed the medication was Revlimid 10 mg.   NOTE: Per OV note 08/10/23 w/ [name], PA: "Due to worsening thrombocytopenia, recommend to hold Revlmid temporarily."  Dr. Leonides Schanz informed of patient question. He asked patient be informed of following:  Continue holding Revlimid at this time. Repeat CBC this week to see if blood counts have improved.  Patient contacted with Dr. Derek Mound message. She verbalized understanding to continue holding Revlimid. Appt for lab scheduled at 0930 on Thursday 08/30/23 -- time at patient's request.

## 2023-08-29 ENCOUNTER — Ambulatory Visit: Payer: Medicare Other | Attending: Hematology and Oncology

## 2023-08-29 DIAGNOSIS — R262 Difficulty in walking, not elsewhere classified: Secondary | ICD-10-CM | POA: Insufficient documentation

## 2023-08-29 DIAGNOSIS — R2689 Other abnormalities of gait and mobility: Secondary | ICD-10-CM | POA: Insufficient documentation

## 2023-08-29 DIAGNOSIS — C9000 Multiple myeloma not having achieved remission: Secondary | ICD-10-CM | POA: Insufficient documentation

## 2023-08-29 DIAGNOSIS — M6281 Muscle weakness (generalized): Secondary | ICD-10-CM | POA: Diagnosis not present

## 2023-08-29 NOTE — Therapy (Signed)
OUTPATIENT PHYSICAL THERAPY  LOWER EXTREMITY ONCOLOGY TREATMENT  Patient Name: LLUVIA GWYNNE MRN: [id number] DOB:01-Nov-1933, 89 y.o., female Today's Date: 08/29/2023  END OF SESSION:  PT End of Session - 08/29/23 1001     Visit Number 9    Number of Visits 16    Date for PT Re-Evaluation 09/13/23    Authorization Type [employer]    Authorization Time Period 9/16-12/28/2024    Authorization - Visit Number 10    Authorization - Number of Visits 12    PT Start Time 1002    PT Stop Time 1055    PT Time Calculation (min) 53 min    Equipment Utilized During Treatment Gait belt    Activity Tolerance Patient tolerated treatment well;Patient limited by fatigue    Behavior During Therapy WFL for tasks assessed/performed              Past Medical History:  Diagnosis Date   Arthritis    some - per patient   Breast cancer (HCC)    breast cancer / left    Cataract    bilat    GERD (gastroesophageal reflux disease)    History of kidney stones    Hyperlipidemia    Hypertension    Hypothyroidism    Macular degeneration    Left   S/P TAVR (transcatheter aortic valve replacement) 09/03/2018   23 mm Edwards Sapien 3 transcatheter heart valve placed via percutaneous right transfemoral approach    Severe aortic stenosis    Stress incontinence    Thyroid disease    Tinnitus    Past Surgical History:  Procedure Laterality Date   ABDOMINAL HYSTERECTOMY  1970's   BACK SURGERY     BREAST LUMPECTOMY  12/1998   lumpectomy   CARDIAC CATHETERIZATION     CARDIOVERSION N/A 04/18/2023   Procedure: CARDIOVERSION;  Surgeon: Tessa Lerner, DO;  Location: [facility] IN[facility]ASIVE [facility];  Service: Cardiovascular;  Laterality: N/A;   CARDIOVERSION N/A 04/20/2023   Procedure: CARDIOVERSION;  Surgeon: Yates Decamp, MD;  Location: [facility]VASIVE CV LAB;  Service: Cardiovascular;  Laterality: N/A;   EYE SURGERY     cataract surgery bilat    INTRAOPERATIVE TRANSTHORACIC ECHOCARDIOGRAM N/A 09/03/2018   Procedure:  INTRAOPERATIVE TRANSTHORACIC ECHOCARDIOGRAM;  Surgeon: Kathleene Hazel, MD;  Location: [facility];  Service: Open Heart Surgery;  Laterality: N/A;   KYPHOPLASTY N/A 09/07/2022   Procedure: THORACIC EIGHT KYPHOPLASTY;  Surgeon: Venita Lick, MD;  Location: [facility] OR;  Service: Orthopedics;  Laterality: N/A;  1 hr Local with [facility] 3 C-Bed   LITHOTRIPSY     Right total knee     2018 Dr. Charlann Boxer   RIGHT/LEFT HEART CATH AND CORONARY ANGIOGRAPHY N/A 08/06/2018   Procedure: RIGHT/LEFT HEART CATH AND CORONARY ANGIOGRAPHY;  Surgeon: Yates Decamp, MD;  Location: [facility] INVASIVE CV [facility];  Service: Cardiovascular;  Laterality: N/A;   THYROIDECTOMY, PARTIAL  1975   TONSILLECTOMY     as a child - patient not sure of exact date   TOTAL KNEE ARTHROPLASTY Left 03/13/2016   Procedure: TOTAL KNEE ARTHROPLASTY;  Surgeon: Durene Romans, MD;  Location: [facility];  Service: Orthopedics;  Laterality: Left;   TOTAL KNEE ARTHROPLASTY Right 06/18/2017   Procedure: RIGHT TOTAL KNEE ARTHROPLASTY;  Surgeon: Durene Romans, MD;  Location: [facility];  Service: Orthopedics;  Laterality: Right;   TRANSCATHETER AORTIC VALVE REPLACEMENT, TRANSFEMORAL N/A 09/03/2018   Procedure: TRANSCATHETER AORTIC VALVE REPLACEMENT, TRANSFEMORAL;  Surgeon: Kathleene Hazel, MD;  Location: [facility] OR;  Service:  Open Heart Surgery;  Laterality: N/A;   Patient Active Problem List   Diagnosis Date Noted   Hypertensive urgency 07/16/2023   Urinary tract infection due to Klebsiella species 07/16/2023   Heart failure with preserved ejection fraction (HCC) 07/16/2023   Hypokalemia 07/16/2023   Hypocalcemia 07/16/2023   Atypical atrial flutter (HCC) 04/19/2023   Acute on chronic diastolic heart failure (HCC) 04/19/2023   Atrial fibrillation with rapid ventricular response (HCC) 04/18/2023   History of stroke 04/16/2023   Fall at home, initial encounter 04/16/2023   Atrial fibrillation (HCC) 04/16/2023   Paroxysmal atrial fibrillation (HCC) 04/15/2023    Elevated troponin 04/15/2023   Chest pain 04/13/2023   Stroke due to embolism (HCC) 03/20/2023   Chest pain, rule out acute myocardial infarction 02/17/2023   Chemotherapy-induced neuropathy (HCC) 11/28/2022   Pain due to onychomycosis of toenails of both feet 11/08/2022   Multiple myeloma not having achieved remission (HCC) 07/24/2022   Chronic diastolic CHF (congestive heart failure) (HCC) 05/16/2022   Symptomatic anemia 05/16/2022   Hyponatremia 04/02/2022   Microcytic anemia 04/02/2022   S/P TAVR (transcatheter aortic valve replacement) 09/03/2018   Hypothyroidism    Hypertension    Hyperlipidemia    GERD (gastroesophageal reflux disease)    S/P right TKA 06/18/2017   Class 1 obesity 03/15/2016   S/P left TKA 03/13/2016   History of breast cancer, DCIS, lumpectomy March 2000 12/13/2011    PCP: Dr. Irena Reichmann  REFERRING PROVIDER: Dr. Jeanie Sewer, IV  REFERRING DIAG: Multiple Myeloma  THERAPY DIAG:  Multiple myeloma without remission (HCC)  Muscle weakness (generalized)  Difficulty walking  Balance problem  ONSET DATE: 07/2022  Rationale for Evaluation and Treatment: Rehabilitation  SUBJECTIVE:                                                                                                                                                                                           SUBJECTIVE STATEMENT:  I am tired this am. I am always tired when I finish here but I expect that    eval I need to get my balance better so I can move without fear of falling. Difficult getting up and down from chairs, Some fatigue with walking short distances;ie car into clinic. Reports some foot numbness if she is cold but feels good with socks on. Her grandson lives with her, but she has a home health aid in the day time and the night time for safety PERTINENT HISTORY:  Pt was diagnosed with  IgGMultiple Myeloma in 07/2022 after a bone Marrow biopsy showed 60% plasma cells and anemia.  No lytic lesions. She was started  on chemotherapy. She had surgery for T 8 compression fracture kyphoplasty on 09/07/2022. She was hospitalized 04/15/2023 for Atrial Fibrillation with cardioversion attempted on 04/18/2023. She had some PT at that time. She has a home health aide that drives her to appts and helps with keeping her house clean And someone at night to help her get to the bathroom  PMH significant for A-fib on Eliquis and amiodarone, hypothyroidism, HTN, breast cancer, multiple myeloma,aortic stenosis status post TAVR, hx of TIA, Macular degeneration, Head aches she thinks related to her eyes PAIN:  Are you having pain? No, just a little soreness in my legs  PRECAUTIONS: PMH significant of A-fib on Eliquis and amiodarone, hypothyroidism, HTN, Left breast cancer, multiple myeloma,aortic stenosis status post TAVR, hx of TIA, T8 compression fx with kyphoplasty  RED FLAGS: Bowel or bladder incontinence: Yes:   and Compression fracture: Yes: had a kyphoplasty    WEIGHT BEARING RESTRICTIONS: No  FALLS:  Has patient fallen in last 6 months? Yes. Number of falls 1, fell in community garden: no injury  LIVING ENVIRONMENT: Lives with: Lucila Maine lives with her presently Lives in: House/apartment Stairs: No;  Has following equipment at home: Single point cane, Environmental consultant - 4 wheeled, and shower chair  OCCUPATION: Retired  LEISURE: read,visit with friends  PRIOR LEVEL OF FUNCTION: Independent with basic ADLs, Independent with household mobility with device, and Needs assistance with homemaking  PATIENT GOALS: Be able to walk more securely, not be dependent on a walker/rollator   OBJECTIVE:  COGNITION: Overall cognitive status: deficits  PALPATION:   OBSERVATIONS / OTHER ASSESSMENTS: Ambulates with rollator, flexed posture/increased kyphosis  SENSATION: Light touch: deficits   POSTURE: forward head, rounded shoulders, increased thoracic kyphosis  LOWER EXTREMITY STRENGTH:  MMT  Right eval  Hip flexion 4-  Hip extension Able to bridge  Hip abduction 4-  Hip adduction 4 -seated  Hip internal rotation 3-  Hip external rotation 3+  Knee flexion 4+  Knee extension 4+  Ankle dorsiflexion 5  Ankle plantarflexion   Ankle inversion   Ankle eversion   Great toe extension    (Blank rows = not tested)  MMT LEFT eval  Hip flexion 4  Hip extension Able to bridge  Hip abduction 4-  Hip adduction 4-  Hip internal rotation 3+  Hip external rotation 3  Knee flexion 4+  Knee extension 4  Ankle dorsiflexion 5  Ankle plantarflexion   Ankle inversion   Ankle eversion   Great toe extension     (Blank rows = not tested)  LYMPHEDEMA ASSESSMENTS:   SURGERY TYPE/DATE: Left lumpectomy 2000  NUMBER OF LYMPH NODES REMOVED: Thinks so, not sure how many  CHEMOTHERAPY: Yes for Multiple Myeloma, No for Breast Cancer  RADIATION:No for Breast Cancer  HORMONE TREATMENT: Yes for Breast Cancer  INFECTIONS: NO  LYMPHEDEMA ASSESSMENTS:   LOWER EXTREMITY LANDMARK RIGHT eval  At groin   30 cm proximal to suprapatella   20 cm proximal to suprapatella   10 cm proximal to suprapatella   At midpatella / popliteal crease   30 cm proximal to floor at lateral plantar foot   20 cm proximal to floor at lateral plantar foot   10 cm proximal to floor at lateral plantar foot   Circumference of ankle/heel   5 cm proximal to 1st MTP joint   Across MTP joint   Around proximal great toe   (Blank rows = not tested)  LOWER EXTREMITY LANDMARK LEFT eval  At groin  30 cm proximal to suprapatella   20 cm proximal to suprapatella   10 cm proximal to suprapatella   At midpatella / popliteal crease   30 cm proximal to floor at lateral plantar foot   20 cm proximal to floor at lateral plantar foot   10 cm proximal to floor at lateral plantar foot   Circumference of ankle/heel   5 cm proximal to 1st MTP joint   Across MTP joint   Around proximal great toe   (Blank rows = not  tested)  FUNCTIONAL TESTS:  30 seconds sit to stand test: 5 repetitions with use of UE's  Timed up and go: 30.4 seconds with rollator and use of hands to rise from chair  Four position balance test:  able to maintain DLS feet together x 10 sec, and half tandem with left foot forward x 10 sec. , unable to hold half tandem with right foot forward for greater than 3 seconds, unable to maintain tandem stance bilaterally for more than a few seconds. Did not test SLS GAIT: Distance walked: Nustep to ramp in front of building Assistive device utilized: Rollator Level of assistance: Modified independence Comments:flexed posture, increased kyphosis    Outcome measure:  TODAY'S TREATMENT:                                                                                                                                          DATE:   08/29/2023  02 99%, 66 HR Nu Step seat 8, UE 9 Lev 3 x 8 min, 573 steps Heel raises x 10 with HH, x 10 without HH High march x 10 B with HH Low march x 10 no HH, CGA Vector reaches with handhold x 5, without x 5 B CGA Sit to stand with purple cushion in chair 2 x 5, CGA 6 in step ups x 10 bilaterally with HH Foot placement on 6 in step forward and sideways bilaterally x 5 ea, no HH, CGA Pt ambulated cancer gym to ortho gym and returned without rest;SBA 08/20/2023 O2 99, HR 67 Nu Step seat 8, UE 8, Lev 3 x 8 min, 578 steps High march alternating x 10 ea with light HH Low march x 10 ea alternating with no HH Foot placement on step x 10 B with no HH to intermittent HH forward and sideways 6 in step ups x 10 B with HH Tandem stance x 5 B to failure in parallel bars Lunges x 10 B with hand hold Ambulate 290 feet with rollator with Supervision emphasis on stride length and posture  08/16/2023 O2 99, HR 55 30 sec sit to stand  6 with use of hands TUG 26 seonds Nu Step;Seat 8, UE 8, Lev 3 x 7 min,522 steps High march alternating x 10 ea with light HH Low  march x 10 ea alternating with no HH Tandem stance Bilaterally x 3  Step ups 6 in x 6 Right and left Sit to stand x 10 with purple ax in chair Foot placement on step x 8 B with no HH to intermittent HH Ambulated 300 ft in clinic with SBA of PT with emphasis on posture and stride length  10/220/2024 HR 69, O2 98% Nu Step;Seat 8, UE 8, Lev 3 x 7 min,522 steps Sit to stand from regular height chair with Min A of PT 2 x 5 LAQ 3# 2 x 10 alternating Half tandem stance x 10 sec or greater B Full tandem x 1 ea High march with HH x 10 ea Low march without HH x 10 6 in step ups bilaterally x 5 ea Foot placement on 6 in step x 10 ea Ambulated 304 feet in clinic with emphasis on erect posture and lengthening stride with supervision    08/09/2023 02 98, HR 63 Nu Step;Seat 8, UE 8, Lev 3 x 6 min In parallel bars: marching with HH x 10, with intermittent HH x 10 CGA of PT Heel raises x 15 with HH, x 10 no HH, CGA of PT  tandem stance x 2 reps each, intermittent hand touchCGA seated rest Sit to stand from chair CGA PT  2 x 5 O2 99, HR 64 afterwards HS Curls x 10 with Red Ambulated to ortho gym and return with a full lap around cancer gym; independent with rollator;VC's for erect posture 6 in step ups x 10 ea leg Mini lunge x 4 ea in parallel bars with HH  08/02/2023 O2 99%, HR 60 Nu Step;Seat 8, UE 8, Lev 2 x 6 min 399steps In parallel bars: marching with HH x 10, with intermittent HH x 10 CGA of PT Heel raises x 10 with HH, x 10 no HH, CGA of PT Semi tandem and tandem stance x 2-3 reps each, CGA seated rest Sit to stand with purple pad on chair 2 x 5 LAQ 2 x 10 B with 2# Ambulated from parallel bars  cancer end to end of ortho gym; brief rest O2 96%, HR 59 then returned back to parallel bars on Cancer side. Seated rest  07/31/2023 O2 99%, HR 66, BP 117/66 Nu Step;Seat 8, UE 8, Lev 2 x 5 min 334 steps sit to stand from chair with purple cushion 2 x 5, 1 x 4 without cushion with min A PT  and VC's for lift In parallel bars: marching with HH x 10, without HH x 10 CGA of PT Heel raises x 10 with HH, x 10 no HH, CGA of PT Semi tandem and tandem stance x 2-3 reps each, CGA seated rest Purple ball squeeze x 10 5 sec hold; emphasis on erect posture Scapular retraction with yellow band x 10 Ambulated from parallel bars to end of ortho gym and return with rollator with emphasis on erect posture, SBA     07/26/2023 Took pts blood pressure 124/60 O2 97, HR 53,  prior to rx Nu Step;Seat 8, UE 8, Lev 2 x 2:30 x 2 reps with rest between sets HR 63 after 2:30 seconds, after seated rest HR 53, O2 100% Heel raises x 15 seated Toe raises x 15 Marching x 15 seated Seated LAQ  2x 10 alternating with 2# B Ball squeeze x 10 Updated HEP with pictures;gave large copy due to Macular degeneration Treatment decreased secondary to pt feeling a little dizzy and fatigued due to sickness the last few weeks and new meds.  07/12/2023 Assessed MMT  B  Sit to stand from slightly higher mat table 2 x 5 with emphasis on control and form without use of hands Nu Step;Seat 7, UE 8, Lev 2 x 5 min, 238 steps Heel raises x 10 with HH in bars Marching x 10 with HH, in bars Hip abd x 10 B, HH Seated LAQ  2x 10 alternating with 2# B Ball squeeze x 10 Bridging 2 x 5 Piriformis stretch x 2 ea 20 sec supine Updated HEP with pictures;gave large copy due to Macular degeneration  EVAL Discussed POC and treatment interventions including, strength, postural education, balance and function   PATIENT EDUCATION:  Access Code: [email or web address] URL: [email or web address] Date: 07/12/2023 Prepared by: Alvira Monday  Exercises - Supine Bridge  - 1 x daily - 7 x weekly - 1 sets - 10 reps - Seated Long Arc Quad  - 1 x daily - 7 x weekly - 1 sets - 10 reps - Standing Hip Abduction with Counter Support  - 1 x daily - 7 x weekly - 1 sets - 10 reps - Seated March  - 1 x daily - 7 x weekly - 1 sets - 10 reps -  Standing March with Counter Support  - 1 x daily - 7 x weekly - 1 sets - 10 reps - Standing Heel Raise  - 1 x daily - 7 x weekly - 1 sets - 10 reps - Seated Hip Adduction Isometrics with Ball  - 1 x daily - 7 x weekly - 1 sets - 10 reps - 5 hold Education details: Discussed POC and treatment interventions including, strength, balance and function Person educated: Patient Education method: Explanation Education comprehension: verbalized understanding  HOME EXERCISE PROGRAM:  Supine Bridge  - 1 x daily - 7 x weekly - 1 sets - 10 reps - Seated Long Arc Quad  - 1 x daily - 7 x weekly - 1 sets - 10 reps - Standing Hip Abduction with Counter Support  - 1 x daily - 7 x weekly - 1 sets - 10 reps - Seated March  - 1 x daily - 7 x weekly - 1 sets - 10 reps - Standing March with Counter Support  - 1 x daily - 7 x weekly - 1 sets - 10 reps - Standing Heel Raise  - 1 x daily - 7 x weekly - 1 sets - 10 reps - Seated Hip Adduction Isometrics with Ball  - 1 x daily - 7 x weekly - 1 sets - 10 reps - 5 hold  ASSESSMENT:  CLINICAL IMPRESSION: Pts legs were tired but not hurting after exercises. She was moving a bit slower today but did well with vector reaches today. She is improving with sit to stand without use of her hands and with more equal wt. Bearing. OBJECTIVE IMPAIRMENTS: decreased activity tolerance, decreased balance, decreased cognition, decreased endurance, decreased knowledge of condition, difficulty walking, decreased strength, impaired sensation, impaired vision/preception, postural dysfunction, and pain.   ACTIVITY LIMITATIONS: carrying, lifting, standing, squatting, bed mobility, dressing, and locomotion level  PARTICIPATION LIMITATIONS: meal prep, cleaning, laundry, driving, shopping, and community activity  PERSONAL FACTORS: Age and 3+ comorbidities: multiple myeloma,Macular Degeneration , Afib are also affecting patient's functional outcome.   REHAB POTENTIAL: Good  CLINICAL DECISION  MAKING: Evolving/moderate complexity  EVALUATION COMPLEXITY: Low   GOALS: Goals reviewed with patient? Yes  SHORT TERM GOALS: Target date: 07/30/2023  Pt will be independent in a HEP for LE/core strength Baseline: Goal status: MET 08/14/2023 2.  Pt will be able to maintain half tandem stance bilaterally x 10 sec Baseline:  Goal status:MET 08/14/2023 3.  Pt will perform 6-7 sit to stands in 30 seconds to decrease fall risk Baseline:  Goal status:MET 10/24, 6 reps with hands 4.  Pt will perform TUG in 26 seconds to decrease fall risk Baseline:   Goal status: MET 10/24   LONG TERM GOALS: Target date: 08/20/2023  Pt will be able to maintain tandem stance for 10 seconds B to demonstrate improved balance Baseline:  Goal status: INITIAL  2.  Pt will be able to perform 9 sit to stands in 30 seconds for average score to decrease fall risk Baseline:  Goal status: INITIAL  3.  Pt will be able to perform TUG with rollator in 24 seconds or less for improved gait speed and decreased fall risk Baseline:  Goal status: INITIAL  4.  Pt will be independent in more advanced HEP Baseline:  Goal status: INITIAL   PLAN:  PT FREQUENCY: 2x/week  PT DURATION: 6 weeks  PLANNED INTERVENTIONS: Therapeutic exercises, Therapeutic activity, Neuromuscular re-education, Balance training, Gait training, Patient/Family education, Self Care, and Manual therapy  PLAN FOR NEXT SESSION:  progress  strength,balance, function, gait   Waynette Buttery, PT 08/29/2023, 10:57 AM

## 2023-08-30 ENCOUNTER — Other Ambulatory Visit: Payer: Medicare Other | Attending: Physician Assistant

## 2023-08-30 DIAGNOSIS — Z7901 Long term (current) use of anticoagulants: Secondary | ICD-10-CM | POA: Diagnosis not present

## 2023-08-30 DIAGNOSIS — G629 Polyneuropathy, unspecified: Secondary | ICD-10-CM | POA: Insufficient documentation

## 2023-08-30 DIAGNOSIS — I959 Hypotension, unspecified: Secondary | ICD-10-CM | POA: Diagnosis not present

## 2023-08-30 DIAGNOSIS — R251 Tremor, unspecified: Secondary | ICD-10-CM | POA: Insufficient documentation

## 2023-08-30 DIAGNOSIS — C9000 Multiple myeloma not having achieved remission: Secondary | ICD-10-CM

## 2023-08-30 DIAGNOSIS — E538 Deficiency of other specified B group vitamins: Secondary | ICD-10-CM | POA: Diagnosis not present

## 2023-08-30 DIAGNOSIS — R600 Localized edema: Secondary | ICD-10-CM | POA: Diagnosis not present

## 2023-08-30 DIAGNOSIS — I4891 Unspecified atrial fibrillation: Secondary | ICD-10-CM | POA: Diagnosis not present

## 2023-08-30 DIAGNOSIS — D6959 Other secondary thrombocytopenia: Secondary | ICD-10-CM | POA: Insufficient documentation

## 2023-08-30 DIAGNOSIS — D649 Anemia, unspecified: Secondary | ICD-10-CM | POA: Diagnosis not present

## 2023-08-30 DIAGNOSIS — D696 Thrombocytopenia, unspecified: Secondary | ICD-10-CM

## 2023-08-30 LAB — CBC WITH DIFFERENTIAL (CANCER CENTER ONLY)
Abs Immature Granulocytes: 0.01 10*3/uL (ref 0.00–0.07)
Basophils Absolute: 0.1 10*3/uL (ref 0.0–0.1)
Basophils Relative: 1 %
Eosinophils Absolute: 0.1 10*3/uL (ref 0.0–0.5)
Eosinophils Relative: 1 %
HCT: 42 % (ref 36.0–46.0)
Hemoglobin: 14.4 g/dL (ref 12.0–15.0)
Immature Granulocytes: 0 %
Lymphocytes Relative: 18 %
Lymphs Abs: 0.8 10*3/uL (ref 0.7–4.0)
MCH: 30.8 pg (ref 26.0–34.0)
MCHC: 34.3 g/dL (ref 30.0–36.0)
MCV: 89.7 fL (ref 80.0–100.0)
Monocytes Absolute: 0.7 10*3/uL (ref 0.1–1.0)
Monocytes Relative: 15 %
Neutro Abs: 2.9 10*3/uL (ref 1.7–7.7)
Neutrophils Relative %: 65 %
Platelet Count: 103 10*3/uL — ABNORMAL LOW (ref 150–400)
RBC: 4.68 MIL/uL (ref 3.87–5.11)
RDW: 17.8 % — ABNORMAL HIGH (ref 11.5–15.5)
WBC Count: 4.6 10*3/uL (ref 4.0–10.5)
nRBC: 0 % (ref 0.0–0.2)

## 2023-08-31 ENCOUNTER — Ambulatory Visit: Payer: Medicare Other

## 2023-08-31 DIAGNOSIS — C9000 Multiple myeloma not having achieved remission: Secondary | ICD-10-CM | POA: Diagnosis not present

## 2023-08-31 DIAGNOSIS — M6281 Muscle weakness (generalized): Secondary | ICD-10-CM | POA: Diagnosis not present

## 2023-08-31 DIAGNOSIS — R2689 Other abnormalities of gait and mobility: Secondary | ICD-10-CM

## 2023-08-31 DIAGNOSIS — R262 Difficulty in walking, not elsewhere classified: Secondary | ICD-10-CM | POA: Diagnosis not present

## 2023-08-31 NOTE — Therapy (Signed)
OUTPATIENT PHYSICAL THERAPY  LOWER EXTREMITY ONCOLOGY TREATMENT  Patient Name: Zoe [name] MRN: [id number] DOB:[birth year], 89 y.o., female Today's Date: 08/31/2023  END OF SESSION:  PT End of Session - 08/31/23 1011     Visit Number 10   Medicare 10th visit note @ 18   Number of Visits 16    Date for PT Re-Evaluation 09/13/23    Authorization Type [employer]    Authorization Time Period 9/16-12/28/2024    Authorization - Number of Visits 12    PT Start Time 1006    PT Stop Time 1053    PT Time Calculation (min) 47 min    Equipment Utilized During Treatment Gait belt    Activity Tolerance Patient tolerated treatment well;Patient limited by fatigue    Behavior During Therapy WFL for tasks assessed/performed              Past Medical History:  Diagnosis Date   Arthritis    some - per patient   Breast cancer (HCC)    breast cancer / left    Cataract    bilat    GERD (gastroesophageal reflux disease)    History of kidney stones    Hyperlipidemia    Hypertension    Hypothyroidism    Macular degeneration    Left   S/P TAVR (transcatheter aortic valve replacement) 09/03/2018   23 mm Edwards Sapien 3 transcatheter heart valve placed via percutaneous right transfemoral approach    Severe aortic stenosis    Stress incontinence    Thyroid disease    Tinnitus    Past Surgical History:  Procedure Laterality Date   ABDOMINAL HYSTERECTOMY  1970's   BACK SURGERY     BREAST LUMPECTOMY  12/1998   lumpectomy   CARDIAC CATHETERIZATION     CARDIOVERSION N/A 04/18/2023   Procedure: CARDIOVERSION;  Surgeon: Tessa Lerner, DO;  Location: [facility] INVASIVE CV LAB;  Service: Cardiovascular;  Laterality: N/A;   CARDIOVERSION N/A 04/20/2023   Procedure: CARDIOVERSION;  Surgeon: Yates Decamp, MD;  Location: [facility] INVASIVE CV LAB;  Service: Cardiovascular;  Laterality: N/A;   EYE SURGERY     cataract surgery bilat    INTRAOPERATIVE TRANSTHORACIC ECHOCARDIOGRAM N/A 09/03/2018   Procedure:  INTRAOPERATIVE TRANSTHORACIC ECHOCARDIOGRAM;  Surgeon: Kathleene Hazel, MD;  Location: [facility] OR;  Service: Open Heart Surgery;  Laterality: N/A;   KYPHOPLASTY N/A 09/07/2022   Procedure: THORACIC EIGHT KYPHOPLASTY;  Surgeon: Venita Lick, MD;  Location: [facility] OR;  Service: Orthopedics;  Laterality: N/A;  1 hr Local with [facility] 3 C-Bed   LITHOTRIPSY     Right total knee     2018 Dr. Charlann Boxer   RIGHT/LEFT HEART CATH AND CORONARY ANGIOGRAPHY N/A 08/06/2018   Procedure: RIGHT/LEFT HEART CATH AND CORONARY ANGIOGRAPHY;  Surgeon: Yates Decamp, MD;  Location: [facility] INVASIVE CV LAB;  Service: Cardiovascular;  Laterality: N/A;   THYROIDECTOMY, PARTIAL  1975   TONSILLECTOMY     as a child - patient not sure of exact date   TOTAL KNEE ARTHROPLASTY Left 03/13/2016   Procedure: TOTAL KNEE ARTHROPLASTY;  Surgeon: Durene Romans, MD;  Location: [facility] ORS;  Service: Orthopedics;  Laterality: Left;   TOTAL KNEE ARTHROPLASTY Right 06/18/2017   Procedure: RIGHT TOTAL KNEE ARTHROPLASTY;  Surgeon: Durene Romans, MD;  Location: [facility] ORS;  Service: Orthopedics;  Laterality: Right;   TRANSCATHETER AORTIC VALVE REPLACEMENT, TRANSFEMORAL N/A 09/03/2018   Procedure: TRANSCATHETER AORTIC VALVE REPLACEMENT, TRANSFEMORAL;  Surgeon: Kathleene Hazel, MD;  Location: [facility] OR;  Service: Open  Heart Surgery;  Laterality: N/A;   Patient Active Problem List   Diagnosis Date Noted   Hypertensive urgency 07/16/2023   Urinary tract infection due to Klebsiella species 07/16/2023   Heart failure with preserved ejection fraction (HCC) 07/16/2023   Hypokalemia 07/16/2023   Hypocalcemia 07/16/2023   Atypical atrial flutter (HCC) 04/19/2023   Acute on chronic diastolic heart failure (HCC) 04/19/2023   Atrial fibrillation with rapid ventricular response (HCC) 04/18/2023   History of stroke 04/16/2023   Fall at home, initial encounter 04/16/2023   Atrial fibrillation (HCC) 04/16/2023   Paroxysmal atrial fibrillation (HCC) 04/15/2023    Elevated troponin 04/15/2023   Chest pain 04/13/2023   Stroke due to embolism (HCC) 03/20/2023   Chest pain, rule out acute myocardial infarction 02/17/2023   Chemotherapy-induced neuropathy (HCC) 11/28/2022   Pain due to onychomycosis of toenails of both feet 11/08/2022   Multiple myeloma not having achieved remission (HCC) 07/24/2022   Chronic diastolic CHF (congestive heart failure) (HCC) 05/16/2022   Symptomatic anemia 05/16/2022   Hyponatremia 04/02/2022   Microcytic anemia 04/02/2022   S/P TAVR (transcatheter aortic valve replacement) 09/03/2018   Hypothyroidism    Hypertension    Hyperlipidemia    GERD (gastroesophageal reflux disease)    S/P right TKA 06/18/2017   Class 1 obesity 03/15/2016   S/P left TKA 03/13/2016   History of breast cancer, DCIS, lumpectomy March 2000 12/13/2011    PCP: Dr. Irena Reichmann  REFERRING PROVIDER: Dr. Jeanie Sewer, IV  REFERRING DIAG: Multiple Myeloma  THERAPY DIAG:  Multiple myeloma without remission (HCC)  Muscle weakness (generalized)  Difficulty walking  Balance problem  ONSET DATE: 07/2022  Rationale for Evaluation and Treatment: Rehabilitation  SUBJECTIVE:                                                                                                                                                                                           SUBJECTIVE STATEMENT:  My thighs just stay so sore. I am trying to walk more at home.     eval I need to get my balance better so I can move without fear of falling. Difficult getting up and down from chairs, Some fatigue with walking short distances;ie car into clinic. Reports some foot numbness if she is cold but feels good with socks on. Her grandson lives with her, but she has a home health aid in the day time and the night time for safety PERTINENT HISTORY:  Pt was diagnosed with  IgGMultiple Myeloma in 07/2022 after a bone Marrow biopsy showed 60% plasma cells and anemia. No lytic  lesions. She was started on chemotherapy. She  had surgery for T 8 compression fracture kyphoplasty on 09/07/2022. She was hospitalized 04/15/2023 for Atrial Fibrillation with cardioversion attempted on 04/18/2023. She had some PT at that time. She has a home health aide that drives her to appts and helps with keeping her house clean And someone at night to help her get to the bathroom  PMH significant for A-fib on Eliquis and amiodarone, hypothyroidism, HTN, breast cancer, multiple myeloma,aortic stenosis status post TAVR, hx of TIA, Macular degeneration, Head aches she thinks related to her eyes PAIN:  Are you having pain? No, just a little soreness in my legs  PRECAUTIONS: PMH significant of A-fib on Eliquis and amiodarone, hypothyroidism, HTN, Left breast cancer, multiple myeloma,aortic stenosis status post TAVR, hx of TIA, T8 compression fx with kyphoplasty  RED FLAGS: Bowel or bladder incontinence: Yes:   and Compression fracture: Yes: had a kyphoplasty    WEIGHT BEARING RESTRICTIONS: No  FALLS:  Has patient fallen in last 6 months? Yes. Number of falls 1, fell in community garden: no injury  LIVING ENVIRONMENT: Lives with: Lucila Maine lives with her presently Lives in: House/apartment Stairs: No;  Has following equipment at home: Single point cane, Environmental consultant - 4 wheeled, and shower chair  OCCUPATION: Retired  LEISURE: read,visit with friends  PRIOR LEVEL OF FUNCTION: Independent with basic ADLs, Independent with household mobility with device, and Needs assistance with homemaking  PATIENT GOALS: Be able to walk more securely, not be dependent on a walker/rollator   OBJECTIVE:  COGNITION: Overall cognitive status: deficits  PALPATION:   OBSERVATIONS / OTHER ASSESSMENTS: Ambulates with rollator, flexed posture/increased kyphosis  SENSATION: Light touch: deficits   POSTURE: forward head, rounded shoulders, increased thoracic kyphosis  LOWER EXTREMITY STRENGTH:  MMT  Right eval  Hip flexion 4-  Hip extension Able to bridge  Hip abduction 4-  Hip adduction 4 -seated  Hip internal rotation 3-  Hip external rotation 3+  Knee flexion 4+  Knee extension 4+  Ankle dorsiflexion 5  Ankle plantarflexion   Ankle inversion   Ankle eversion   Great toe extension    (Blank rows = not tested)  MMT LEFT eval  Hip flexion 4  Hip extension Able to bridge  Hip abduction 4-  Hip adduction 4-  Hip internal rotation 3+  Hip external rotation 3  Knee flexion 4+  Knee extension 4  Ankle dorsiflexion 5  Ankle plantarflexion   Ankle inversion   Ankle eversion   Great toe extension     (Blank rows = not tested)  LYMPHEDEMA ASSESSMENTS:   SURGERY TYPE/DATE: Left lumpectomy 2000  NUMBER OF LYMPH NODES REMOVED: Thinks so, not sure how many  CHEMOTHERAPY: Yes for Multiple Myeloma, No for Breast Cancer  RADIATION:No for Breast Cancer  HORMONE TREATMENT: Yes for Breast Cancer  INFECTIONS: NO  LYMPHEDEMA ASSESSMENTS:   LOWER EXTREMITY LANDMARK RIGHT eval  At groin   30 cm proximal to suprapatella   20 cm proximal to suprapatella   10 cm proximal to suprapatella   At midpatella / popliteal crease   30 cm proximal to floor at lateral plantar foot   20 cm proximal to floor at lateral plantar foot   10 cm proximal to floor at lateral plantar foot   Circumference of ankle/heel   5 cm proximal to 1st MTP joint   Across MTP joint   Around proximal great toe   (Blank rows = not tested)  LOWER EXTREMITY LANDMARK LEFT eval  At groin   30 cm proximal  to suprapatella   20 cm proximal to suprapatella   10 cm proximal to suprapatella   At midpatella / popliteal crease   30 cm proximal to floor at lateral plantar foot   20 cm proximal to floor at lateral plantar foot   10 cm proximal to floor at lateral plantar foot   Circumference of ankle/heel   5 cm proximal to 1st MTP joint   Across MTP joint   Around proximal great toe   (Blank rows = not  tested)  FUNCTIONAL TESTS:  30 seconds sit to stand test: 5 repetitions with use of UE's  Timed up and go: 30.4 seconds with rollator and use of hands to rise from chair  Four position balance test:  able to maintain DLS feet together x 10 sec, and half tandem with left foot forward x 10 sec. , unable to hold half tandem with right foot forward for greater than 3 seconds, unable to maintain tandem stance bilaterally for more than a few seconds. Did not test SLS GAIT: Distance walked: Nustep to ramp in front of building Assistive device utilized: Rollator Level of assistance: Modified independence Comments:flexed posture, increased kyphosis    Outcome measure:  TODAY'S TREATMENT:                                                                                                                                          DATE:   08/31/23:  SpO2 98%, HR 61 bpm Therapeutic Exercises Nu Step seat 8, UE 9 Lev 3 x 8 min, 546 steps In // bars:Heel raises x 10 with HH, x 10 without HH High march x 10 B with HH Low march x 10 no HH, CGA UE reaches with SBA-CGA x 5 each side encouraging reaching until it becomes challenging Sit to stand with purple cushion in chair 2 x 5, CGA 6 in step ups 2 x 5 bilaterally with HH Foot placement on 6 in step forward 2 x 5 ea with second set no HH and sideways bilaterally x 5 ea, no HH; min VC's for erect posture throughout Pt ambulated from cancer gym to ortho gym and returned without rest and with rollator; SBA   08/29/2023  02 99%, 66 HR Nu Step seat 8, UE 9 Lev 3 x 8 min, 573 steps Heel raises x 10 with HH, x 10 without HH High march x 10 B with HH Low march x 10 no HH, CGA Vector reaches with handhold x 5, without x 5 B CGA Sit to stand with purple cushion in chair 2 x 5, CGA 6 in step ups x 10 bilaterally with HH Foot placement on 6 in step forward and sideways bilaterally x 5 ea, no HH, CGA Pt ambulated cancer gym to ortho gym and returned without  rest;SBA 08/20/2023 O2 99, HR 67 Nu Step seat 8, UE 8, Lev 3 x 8 min,  578 steps High march alternating x 10 ea with light HH Low march x 10 ea alternating with no HH Foot placement on step x 10 B with no HH to intermittent HH forward and sideways 6 in step ups x 10 B with HH Tandem stance x 5 B to failure in parallel bars Lunges x 10 B with hand hold Ambulate 290 feet with rollator with Supervision emphasis on stride length and posture  08/16/2023 O2 99, HR 55 30 sec sit to stand  6 with use of hands TUG 26 seonds Nu Step;Seat 8, UE 8, Lev 3 x 7 min,522 steps High march alternating x 10 ea with light HH Low march x 10 ea alternating with no HH Tandem stance Bilaterally x 3 Step ups 6 in x 6 Right and left Sit to stand x 10 with purple ax in chair Foot placement on step x 8 B with no HH to intermittent HH Ambulated 300 ft in clinic with SBA of PT with emphasis on posture and stride length  10/220/2024 HR 69, O2 98% Nu Step;Seat 8, UE 8, Lev 3 x 7 min,522 steps Sit to stand from regular height chair with Min A of PT 2 x 5 LAQ 3# 2 x 10 alternating Half tandem stance x 10 sec or greater B Full tandem x 1 ea High march with HH x 10 ea Low march without HH x 10 6 in step ups bilaterally x 5 ea Foot placement on 6 in step x 10 ea Ambulated 304 feet in clinic with emphasis on erect posture and lengthening stride with supervision    08/09/2023 02 98, HR 63 Nu Step;Seat 8, UE 8, Lev 3 x 6 min In parallel bars: marching with HH x 10, with intermittent HH x 10 CGA of PT Heel raises x 15 with HH, x 10 no HH, CGA of PT  tandem stance x 2 reps each, intermittent hand touchCGA seated rest Sit to stand from chair CGA PT  2 x 5 O2 99, HR 64 afterwards HS Curls x 10 with Red Ambulated to ortho gym and return with a full lap around cancer gym; independent with rollator;VC's for erect posture 6 in step ups x 10 ea leg Mini lunge x 4 ea in parallel bars with [facility]    PATIENT EDUCATION:   Access Code: [email or web address] URL: [email or web address] Date: 07/12/2023 Prepared by: Alvira Monday  Exercises - Supine Bridge  - 1 x daily - 7 x weekly - 1 sets - 10 reps - Seated Long Arc Quad  - 1 x daily - 7 x weekly - 1 sets - 10 reps - Standing Hip Abduction with Counter Support  - 1 x daily - 7 x weekly - 1 sets - 10 reps - Seated March  - 1 x daily - 7 x weekly - 1 sets - 10 reps - Standing March with Counter Support  - 1 x daily - 7 x weekly - 1 sets - 10 reps - Standing Heel Raise  - 1 x daily - 7 x weekly - 1 sets - 10 reps - Seated Hip Adduction Isometrics with Ball  - 1 x daily - 7 x weekly - 1 sets - 10 reps - 5 hold Education details: Discussed POC and treatment interventions including, strength, balance and function Person educated: Patient Education method: Explanation Education comprehension: verbalized understanding  HOME EXERCISE PROGRAM:  Supine Bridge  - 1 x daily - 7 x weekly - 1 sets - 10 reps -  Seated Long Arc Quad  - 1 x daily - 7 x weekly - 1 sets - 10 reps - Standing Hip Abduction with Counter Support  - 1 x daily - 7 x weekly - 1 sets - 10 reps - Seated March  - 1 x daily - 7 x weekly - 1 sets - 10 reps - Standing March with Counter Support  - 1 x daily - 7 x weekly - 1 sets - 10 reps - Standing Heel Raise  - 1 x daily - 7 x weekly - 1 sets - 10 reps - Seated Hip Adduction Isometrics with Ball  - 1 x daily - 7 x weekly - 1 sets - 10 reps - 5 hold  ASSESSMENT:  CLINICAL IMPRESSION: Pt did very well without use of hands with sit to stand as well as with toe taps, though did require encouragement to do so. Encouraged her to cont working on increasing distance with gait for exercise at home and to potentially help decrease LE soreness she has been feeling after each session.   OBJECTIVE IMPAIRMENTS: decreased activity tolerance, decreased balance, decreased cognition, decreased endurance, decreased knowledge of condition, difficulty walking, decreased  strength, impaired sensation, impaired vision/preception, postural dysfunction, and pain.   ACTIVITY LIMITATIONS: carrying, lifting, standing, squatting, bed mobility, dressing, and locomotion level  PARTICIPATION LIMITATIONS: meal prep, cleaning, laundry, driving, shopping, and community activity  PERSONAL FACTORS: Age and 3+ comorbidities: multiple myeloma,Macular Degeneration , Afib are also affecting patient's functional outcome.   REHAB POTENTIAL: Good  CLINICAL DECISION MAKING: Evolving/moderate complexity  EVALUATION COMPLEXITY: Low   GOALS: Goals reviewed with patient? Yes  SHORT TERM GOALS: Target date: 07/30/2023  Pt will be independent in a HEP for LE/core strength Baseline: Goal status: MET 08/14/2023 2.  Pt will be able to maintain half tandem stance bilaterally x 10 sec Baseline:  Goal status:MET 08/14/2023 3.  Pt will perform 6-7 sit to stands in 30 seconds to decrease fall risk Baseline:  Goal status:MET 10/24, 6 reps with hands 4.  Pt will perform TUG in 26 seconds to decrease fall risk Baseline:   Goal status: MET 10/24   LONG TERM GOALS: Target date: 08/20/2023  Pt will be able to maintain tandem stance for 10 seconds B to demonstrate improved balance Baseline:  Goal status: INITIAL  2.  Pt will be able to perform 9 sit to stands in 30 seconds for average score to decrease fall risk Baseline:  Goal status: INITIAL  3.  Pt will be able to perform TUG with rollator in 24 seconds or less for improved gait speed and decreased fall risk Baseline:  Goal status: INITIAL  4.  Pt will be independent in more advanced HEP Baseline:  Goal status: INITIAL   PLAN:  PT FREQUENCY: 2x/week  PT DURATION: 6 weeks  PLANNED INTERVENTIONS: Therapeutic exercises, Therapeutic activity, Neuromuscular re-education, Balance training, Gait training, Patient/Family education, Self Care, and Manual therapy  PLAN FOR NEXT SESSION:  progress strength, balance, function,  gait   Hermenia Bers, PTA 08/31/2023, 10:58 AM

## 2023-09-04 ENCOUNTER — Ambulatory Visit: Payer: Medicare Other

## 2023-09-04 DIAGNOSIS — R2689 Other abnormalities of gait and mobility: Secondary | ICD-10-CM | POA: Diagnosis not present

## 2023-09-04 DIAGNOSIS — M6281 Muscle weakness (generalized): Secondary | ICD-10-CM

## 2023-09-04 DIAGNOSIS — C9000 Multiple myeloma not having achieved remission: Secondary | ICD-10-CM

## 2023-09-04 DIAGNOSIS — R262 Difficulty in walking, not elsewhere classified: Secondary | ICD-10-CM

## 2023-09-04 NOTE — Therapy (Signed)
OUTPATIENT PHYSICAL THERAPY  LOWER EXTREMITY ONCOLOGY TREATMENT  Patient Name: Zoe [name] MRN: [id number] DOB:11/29/1933, 89 y.o., female Today's Date: 09/04/2023  END OF SESSION:  PT End of Session - 09/04/23 1113     Visit Number 11    Number of Visits 16    Date for PT Re-Evaluation 09/13/23    Authorization Type [employer]    Authorization Time Period 9/16-12/28/2024 (Medicare 10th visit note @ 18  )    PT Start Time 1111   therapist running late from previous appt   PT Stop Time 1156    PT Time Calculation (min) 45 min    Equipment Utilized During Treatment --    Activity Tolerance Patient tolerated treatment well;Patient limited by fatigue    Behavior During Therapy [facility] for tasks assessed/performed              Past Medical History:  Diagnosis Date   Arthritis    some - per patient   Breast cancer (HCC)    breast cancer / left    Cataract    bilat    GERD (gastroesophageal reflux disease)    History of kidney stones    Hyperlipidemia    Hypertension    Hypothyroidism    Macular degeneration    Left   S/P TAVR (transcatheter aortic valve replacement) 09/03/2018   23 mm Edwards Sapien 3 transcatheter heart valve placed via percutaneous right transfemoral approach    Severe aortic stenosis    Stress incontinence    Thyroid disease    Tinnitus    Past Surgical History:  Procedure Laterality Date   ABDOMINAL HYSTERECTOMY  1970's   BACK SURGERY     BREAST LUMPECTOMY  12/1998   lumpectomy   CARDIAC CATHETERIZATION     CARDIOVERSION N/A 04/18/2023   Procedure: CARDIOVERSION;  Surgeon: Tessa Lerner, DO;  Location: [facility] INVASIVE CV LAB;  Service: Cardiovascular;  Laterality: N/A;   CARDIOVERSION N/A 04/20/2023   Procedure: CARDIOVERSION;  Surgeon: Yates Decamp, MD;  Location: [facility] INVASIVE CV LAB;  Service: Cardiovascular;  Laterality: N/A;   EYE SURGERY     cataract surgery bilat    INTRAOPERATIVE TRANSTHORACIC ECHOCARDIOGRAM N/A 09/03/2018   Procedure:  INTRAOPERATIVE TRANSTHORACIC ECHOCARDIOGRAM;  Surgeon: Kathleene Hazel, MD;  Location: [facility] OR;  Service: Open Heart Surgery;  Laterality: N/A;   KYPHOPLASTY N/A 09/07/2022   Procedure: THORACIC EIGHT KYPHOPLASTY;  Surgeon: Venita Lick, MD;  Location: [facility] OR;  Service: Orthopedics;  Laterality: N/A;  1 hr Local with [facility] 3 C-Bed   LITHOTRIPSY     Right total knee     2018 Dr. Charlann Boxer   RIGHT/LEFT HEART CATH AND CORONARY ANGIOGRAPHY N/A 08/06/2018   Procedure: RIGHT/LEFT HEART CATH AND CORONARY ANGIOGRAPHY;  Surgeon: Yates Decamp, MD;  Location: [facility] INVASIVE CV LAB;  Service: Cardiovascular;  Laterality: N/A;   THYROIDECTOMY, PARTIAL  1975   TONSILLECTOMY     as a child - patient not sure of exact date   TOTAL KNEE ARTHROPLASTY Left 03/13/2016   Procedure: TOTAL KNEE ARTHROPLASTY;  Surgeon: Durene Romans, MD;  Location: [facility] ORS;  Service: Orthopedics;  Laterality: Left;   TOTAL KNEE ARTHROPLASTY Right 06/18/2017   Procedure: RIGHT TOTAL KNEE ARTHROPLASTY;  Surgeon: Durene Romans, MD;  Location: [facility] ORS;  Service: Orthopedics;  Laterality: Right;   TRANSCATHETER AORTIC VALVE REPLACEMENT, TRANSFEMORAL N/A 09/03/2018   Procedure: TRANSCATHETER AORTIC VALVE REPLACEMENT, TRANSFEMORAL;  Surgeon: Kathleene Hazel, MD;  Location: [facility] OR;  Service: Open Heart Surgery;  Laterality: N/A;   Patient Active Problem List   Diagnosis Date Noted   Hypertensive urgency 07/16/2023   Urinary tract infection due to Klebsiella species 07/16/2023   Heart failure with preserved ejection fraction (HCC) 07/16/2023   Hypokalemia 07/16/2023   Hypocalcemia 07/16/2023   Atypical atrial flutter (HCC) 04/19/2023   Acute on chronic diastolic heart failure (HCC) 04/19/2023   Atrial fibrillation with rapid ventricular response (HCC) 04/18/2023   History of stroke 04/16/2023   Fall at home, initial encounter 04/16/2023   Atrial fibrillation (HCC) 04/16/2023   Paroxysmal atrial fibrillation (HCC) 04/15/2023    Elevated troponin 04/15/2023   Chest pain 04/13/2023   Stroke due to embolism (HCC) 03/20/2023   Chest pain, rule out acute myocardial infarction 02/17/2023   Chemotherapy-induced neuropathy (HCC) 11/28/2022   Pain due to onychomycosis of toenails of both feet 11/08/2022   Multiple myeloma not having achieved remission (HCC) 07/24/2022   Chronic diastolic CHF (congestive heart failure) (HCC) 05/16/2022   Symptomatic anemia 05/16/2022   Hyponatremia 04/02/2022   Microcytic anemia 04/02/2022   S/P TAVR (transcatheter aortic valve replacement) 09/03/2018   Hypothyroidism    Hypertension    Hyperlipidemia    GERD (gastroesophageal reflux disease)    S/P right TKA 06/18/2017   Class 1 obesity 03/15/2016   S/P left TKA 03/13/2016   History of breast cancer, DCIS, lumpectomy March 2000 12/13/2011    PCP: Dr. Irena Reichmann  REFERRING PROVIDER: Dr. Jeanie Sewer, IV  REFERRING DIAG: Multiple Myeloma  THERAPY DIAG:  Multiple myeloma without remission (HCC)  Muscle weakness (generalized)  Difficulty walking  Balance problem  ONSET DATE: 07/2022  Rationale for Evaluation and Treatment: Rehabilitation  SUBJECTIVE:                                                                                                                                                                                           SUBJECTIVE STATEMENT:  My legs stay sore but I'm trying to move more.     eval I need to get my balance better so I can move without fear of falling. Difficult getting up and down from chairs, Some fatigue with walking short distances;ie car into clinic. Reports some foot numbness if she is cold but feels good with socks on. Her grandson lives with her, but she has a home health aid in the day time and the night time for safety PERTINENT HISTORY:  Pt was diagnosed with  IgGMultiple Myeloma in 07/2022 after a bone Marrow biopsy showed 60% plasma cells and anemia. No lytic lesions. She was  started on chemotherapy. She had surgery for T 8 compression fracture  kyphoplasty on 09/07/2022. She was hospitalized 04/15/2023 for Atrial Fibrillation with cardioversion attempted on 04/18/2023. She had some PT at that time. She has a home health aide that drives her to appts and helps with keeping her house clean And someone at night to help her get to the bathroom  PMH significant for A-fib on Eliquis and amiodarone, hypothyroidism, HTN, breast cancer, multiple myeloma,aortic stenosis status post TAVR, hx of TIA, Macular degeneration, Head aches she thinks related to her eyes PAIN:  Are you having pain? No, just a little soreness in my legs  PRECAUTIONS: PMH significant of A-fib on Eliquis and amiodarone, hypothyroidism, HTN, Left breast cancer, multiple myeloma,aortic stenosis status post TAVR, hx of TIA, T8 compression fx with kyphoplasty  RED FLAGS: Bowel or bladder incontinence: Yes:   and Compression fracture: Yes: had a kyphoplasty    WEIGHT BEARING RESTRICTIONS: No  FALLS:  Has patient fallen in last 6 months? Yes. Number of falls 1, fell in community garden: no injury  LIVING ENVIRONMENT: Lives with: Lucila Maine lives with her presently Lives in: House/apartment Stairs: No;  Has following equipment at home: Single point cane, Environmental consultant - 4 wheeled, and shower chair  OCCUPATION: Retired  LEISURE: read,visit with friends  PRIOR LEVEL OF FUNCTION: Independent with basic ADLs, Independent with household mobility with device, and Needs assistance with homemaking  PATIENT GOALS: Be able to walk more securely, not be dependent on a walker/rollator   OBJECTIVE:  COGNITION: Overall cognitive status: deficits  PALPATION:   OBSERVATIONS / OTHER ASSESSMENTS: Ambulates with rollator, flexed posture/increased kyphosis  SENSATION: Light touch: deficits   POSTURE: forward head, rounded shoulders, increased thoracic kyphosis  LOWER EXTREMITY STRENGTH:  MMT Right eval  Hip flexion  4-  Hip extension Able to bridge  Hip abduction 4-  Hip adduction 4 -seated  Hip internal rotation 3-  Hip external rotation 3+  Knee flexion 4+  Knee extension 4+  Ankle dorsiflexion 5  Ankle plantarflexion   Ankle inversion   Ankle eversion   Great toe extension    (Blank rows = not tested)  MMT LEFT eval  Hip flexion 4  Hip extension Able to bridge  Hip abduction 4-  Hip adduction 4-  Hip internal rotation 3+  Hip external rotation 3  Knee flexion 4+  Knee extension 4  Ankle dorsiflexion 5  Ankle plantarflexion   Ankle inversion   Ankle eversion   Great toe extension     (Blank rows = not tested)  LYMPHEDEMA ASSESSMENTS:   SURGERY TYPE/DATE: Left lumpectomy 2000  NUMBER OF LYMPH NODES REMOVED: Thinks so, not sure how many  CHEMOTHERAPY: Yes for Multiple Myeloma, No for Breast Cancer  RADIATION:No for Breast Cancer  HORMONE TREATMENT: Yes for Breast Cancer  INFECTIONS: NO  LYMPHEDEMA ASSESSMENTS:   LOWER EXTREMITY LANDMARK RIGHT eval  At groin   30 cm proximal to suprapatella   20 cm proximal to suprapatella   10 cm proximal to suprapatella   At midpatella / popliteal crease   30 cm proximal to floor at lateral plantar foot   20 cm proximal to floor at lateral plantar foot   10 cm proximal to floor at lateral plantar foot   Circumference of ankle/heel   5 cm proximal to 1st MTP joint   Across MTP joint   Around proximal great toe   (Blank rows = not tested)  LOWER EXTREMITY LANDMARK LEFT eval  At groin   30 cm proximal to suprapatella   20 cm proximal  to suprapatella   10 cm proximal to suprapatella   At midpatella / popliteal crease   30 cm proximal to floor at lateral plantar foot   20 cm proximal to floor at lateral plantar foot   10 cm proximal to floor at lateral plantar foot   Circumference of ankle/heel   5 cm proximal to 1st MTP joint   Across MTP joint   Around proximal great toe   (Blank rows = not tested)  FUNCTIONAL TESTS:   30 seconds sit to stand test: 5 repetitions with use of UE's  Timed up and go: 30.4 seconds with rollator and use of hands to rise from chair  Four position balance test:  able to maintain DLS feet together x 10 sec, and half tandem with left foot forward x 10 sec. , unable to hold half tandem with right foot forward for greater than 3 seconds, unable to maintain tandem stance bilaterally for more than a few seconds. Did not test SLS GAIT: Distance walked: Nustep to ramp in front of building Assistive device utilized: Rollator Level of assistance: Modified independence Comments:flexed posture, increased kyphosis    Outcome measure:  TODAY'S TREATMENT:                                                                                                                                          DATE:  09/04/23: Therapeutic Exercises Nu Step seat 8, UE 9 Lev 3 x 8 min, 605 steps In // bars: Heel raises x 10 with HH, x 10 without HH High march x 10 B with fingertip assist Low march x 10 no HH, SBA Vector reaches with handhold x 5 each returning therapist demo Sit to stand with purple cushion in chair 2 x 5, CGA In // bars for multiple trials of modified tandem stance bilaterally in varying positions for 10 sec at a time working towards no HH On mini tramp for following: High march 2 x 5 each with +2 HH, 3 way weight shifting x 30 sec each +2 HH, then 1 trial partial tandem stance each x 30 sec with fingertips Pt ambulated from cancer gym to ortho gym and returned without rest and with rollator; SBA   08/31/23:  SpO2 98%, HR 61 bpm Therapeutic Exercises Nu Step seat 8, UE 9 Lev 3 x 8 min, 546 steps In // bars:Heel raises x 10 with HH, x 10 without HH High march x 10 B with HH Low march x 10 no HH, CGA UE reaches with SBA-CGA x 5 each side encouraging reaching until it becomes challenging Sit to stand with purple cushion in chair 2 x 5, CGA 6 in step ups 2 x 5 bilaterally with HH Foot  placement on 6 in step forward 2 x 5 ea with second set no HH and sideways bilaterally x 5 ea, no HH; min VC's for erect posture throughout  Pt ambulated from cancer gym to ortho gym and returned without rest and with rollator; SBA   08/29/2023  02 99%, 66 HR Nu Step seat 8, UE 9 Lev 3 x 8 min, 573 steps Heel raises x 10 with HH, x 10 without HH High march x 10 B with HH Low march x 10 no HH, CGA Vector reaches with handhold x 5, without x 5 B CGA Sit to stand with purple cushion in chair 2 x 5, CGA 6 in step ups x 10 bilaterally with HH Foot placement on 6 in step forward and sideways bilaterally x 5 ea, no HH, CGA Pt ambulated cancer gym to ortho gym and returned without rest;SBA 08/20/2023 O2 99, HR 67 Nu Step seat 8, UE 8, Lev 3 x 8 min, 578 steps High march alternating x 10 ea with light HH Low march x 10 ea alternating with no HH Foot placement on step x 10 B with no HH to intermittent HH forward and sideways 6 in step ups x 10 B with HH Tandem stance x 5 B to failure in parallel bars Lunges x 10 B with hand hold Ambulate 290 feet with rollator with Supervision emphasis on stride length and posture  08/16/2023 O2 99, HR 55 30 sec sit to stand  6 with use of hands TUG 26 seonds Nu Step;Seat 8, UE 8, Lev 3 x 7 min,522 steps High march alternating x 10 ea with light HH Low march x 10 ea alternating with no HH Tandem stance Bilaterally x 3 Step ups 6 in x 6 Right and left Sit to stand x 10 with purple ax in chair Foot placement on step x 8 B with no HH to intermittent HH Ambulated 300 ft in clinic with SBA of PT with emphasis on posture and stride length  10/220/2024 HR 69, O2 98% Nu Step;Seat 8, UE 8, Lev 3 x 7 min,522 steps Sit to stand from regular height chair with Min A of PT 2 x 5 LAQ 3# 2 x 10 alternating Half tandem stance x 10 sec or greater B Full tandem x 1 ea High march with HH x 10 ea Low march without HH x 10 6 in step ups bilaterally x 5 ea Foot  placement on 6 in step x 10 ea Ambulated 304 feet in clinic with emphasis on erect posture and lengthening stride with supervision    08/09/2023 02 98, HR 63 Nu Step;Seat 8, UE 8, Lev 3 x 6 min In parallel bars: marching with HH x 10, with intermittent HH x 10 CGA of PT Heel raises x 15 with HH, x 10 no HH, CGA of PT  tandem stance x 2 reps each, intermittent hand touchCGA seated rest Sit to stand from chair CGA PT  2 x 5 O2 99, HR 64 afterwards HS Curls x 10 with Red Ambulated to ortho gym and return with a full lap around cancer gym; independent with rollator;VC's for erect posture 6 in step ups x 10 ea leg Mini lunge x 4 ea in parallel bars with [facility]    PATIENT EDUCATION:  Access Code: [email or web address] URL: [email or web address] Date: 07/12/2023 Prepared by: Alvira Monday  Exercises - Supine Bridge  - 1 x daily - 7 x weekly - 1 sets - 10 reps - Seated Long Arc Quad  - 1 x daily - 7 x weekly - 1 sets - 10 reps - Standing Hip Abduction with Counter Support  - 1 x daily - 7  x weekly - 1 sets - 10 reps - Seated March  - 1 x daily - 7 x weekly - 1 sets - 10 reps - Standing March with Counter Support  - 1 x daily - 7 x weekly - 1 sets - 10 reps - Standing Heel Raise  - 1 x daily - 7 x weekly - 1 sets - 10 reps - Seated Hip Adduction Isometrics with Ball  - 1 x daily - 7 x weekly - 1 sets - 10 reps - 5 hold Education details: Discussed POC and treatment interventions including, strength, balance and function Person educated: Patient Education method: Explanation Education comprehension: verbalized understanding  HOME EXERCISE PROGRAM:  Supine Bridge  - 1 x daily - 7 x weekly - 1 sets - 10 reps - Seated Long Arc Quad  - 1 x daily - 7 x weekly - 1 sets - 10 reps - Standing Hip Abduction with Counter Support  - 1 x daily - 7 x weekly - 1 sets - 10 reps - Seated March  - 1 x daily - 7 x weekly - 1 sets - 10 reps - Standing March with Counter Support  - 1 x daily - 7 x weekly - 1  sets - 10 reps - Standing Heel Raise  - 1 x daily - 7 x weekly - 1 sets - 10 reps - Seated Hip Adduction Isometrics with Ball  - 1 x daily - 7 x weekly - 1 sets - 10 reps - 5 hold  ASSESSMENT:  CLINICAL IMPRESSION: Pt is showing good improvement with her sit to stand with greater ease demonstrating improved bil leg strength. Progressed her to include static and dynamic balance activities on the mini tramp which she was challenged by.   OBJECTIVE IMPAIRMENTS: decreased activity tolerance, decreased balance, decreased cognition, decreased endurance, decreased knowledge of condition, difficulty walking, decreased strength, impaired sensation, impaired vision/preception, postural dysfunction, and pain.   ACTIVITY LIMITATIONS: carrying, lifting, standing, squatting, bed mobility, dressing, and locomotion level  PARTICIPATION LIMITATIONS: meal prep, cleaning, laundry, driving, shopping, and community activity  PERSONAL FACTORS: Age and 3+ comorbidities: multiple myeloma,Macular Degeneration , Afib are also affecting patient's functional outcome.   REHAB POTENTIAL: Good  CLINICAL DECISION MAKING: Evolving/moderate complexity  EVALUATION COMPLEXITY: Low   GOALS: Goals reviewed with patient? Yes  SHORT TERM GOALS: Target date: 07/30/2023  Pt will be independent in a HEP for LE/core strength Baseline: Goal status: MET 08/14/2023 2.  Pt will be able to maintain half tandem stance bilaterally x 10 sec Baseline:  Goal status:MET 08/14/2023 3.  Pt will perform 6-7 sit to stands in 30 seconds to decrease fall risk Baseline:  Goal status:MET 10/24, 6 reps with hands 4.  Pt will perform TUG in 26 seconds to decrease fall risk Baseline:   Goal status: MET 10/24   LONG TERM GOALS: Target date: 08/20/2023  Pt will be able to maintain tandem stance for 10 seconds B to demonstrate improved balance Baseline:  Goal status: INITIAL  2.  Pt will be able to perform 9 sit to stands in 30 seconds  for average score to decrease fall risk Baseline:  Goal status: INITIAL  3.  Pt will be able to perform TUG with rollator in 24 seconds or less for improved gait speed and decreased fall risk Baseline:  Goal status: INITIAL  4.  Pt will be independent in more advanced HEP Baseline:  Goal status: INITIAL   PLAN:  PT FREQUENCY: 2x/week  PT  DURATION: 6 weeks  PLANNED INTERVENTIONS: Therapeutic exercises, Therapeutic activity, Neuromuscular re-education, Balance training, Gait training, Patient/Family education, Self Care, and Manual therapy  PLAN FOR NEXT SESSION:  progress strength, balance, function, gait   Hermenia Bers, PTA 09/04/2023, 12:02 PM

## 2023-09-05 ENCOUNTER — Encounter (HOSPITAL_COMMUNITY): Payer: Self-pay

## 2023-09-05 ENCOUNTER — Emergency Department (HOSPITAL_COMMUNITY): Payer: Medicare Other

## 2023-09-05 ENCOUNTER — Emergency Department (HOSPITAL_COMMUNITY)
Admission: EM | Admit: 2023-09-05 | Discharge: 2023-09-05 | Disposition: A | Discharge status: Home / Self Care | Payer: Medicare Other | Attending: Emergency Medicine | Admitting: Emergency Medicine

## 2023-09-05 ENCOUNTER — Other Ambulatory Visit: Payer: Self-pay | Admitting: Nurse Practitioner

## 2023-09-05 ENCOUNTER — Other Ambulatory Visit: Payer: Self-pay

## 2023-09-05 DIAGNOSIS — I6782 Cerebral ischemia: Secondary | ICD-10-CM | POA: Diagnosis not present

## 2023-09-05 DIAGNOSIS — Z7901 Long term (current) use of anticoagulants: Secondary | ICD-10-CM | POA: Diagnosis not present

## 2023-09-05 DIAGNOSIS — R55 Syncope and collapse: Secondary | ICD-10-CM | POA: Diagnosis not present

## 2023-09-05 DIAGNOSIS — R519 Headache, unspecified: Secondary | ICD-10-CM | POA: Diagnosis not present

## 2023-09-05 DIAGNOSIS — C9000 Multiple myeloma not having achieved remission: Secondary | ICD-10-CM

## 2023-09-05 DIAGNOSIS — R42 Dizziness and giddiness: Secondary | ICD-10-CM | POA: Insufficient documentation

## 2023-09-05 LAB — URINALYSIS, ROUTINE W REFLEX MICROSCOPIC
Bilirubin Urine: NEGATIVE
Glucose, UA: NEGATIVE mg/dL
Hgb urine dipstick: NEGATIVE
Ketones, ur: NEGATIVE mg/dL
Leukocytes,Ua: NEGATIVE
Nitrite: NEGATIVE
Protein, ur: NEGATIVE mg/dL
Specific Gravity, Urine: 1.018 (ref 1.005–1.030)
pH: 5 (ref 5.0–8.0)

## 2023-09-05 LAB — CBC
HCT: 46.3 % — ABNORMAL HIGH (ref 36.0–46.0)
Hemoglobin: 14.9 g/dL (ref 12.0–15.0)
MCH: 30 pg (ref 26.0–34.0)
MCHC: 32.2 g/dL (ref 30.0–36.0)
MCV: 93.3 fL (ref 80.0–100.0)
Platelets: 123 10*3/uL — ABNORMAL LOW (ref 150–400)
RBC: 4.96 MIL/uL (ref 3.87–5.11)
RDW: 17.9 % — ABNORMAL HIGH (ref 11.5–15.5)
WBC: 5.4 10*3/uL (ref 4.0–10.5)
nRBC: 0 % (ref 0.0–0.2)

## 2023-09-05 LAB — BASIC METABOLIC PANEL
Anion gap: 11 (ref 5–15)
BUN: 28 mg/dL — ABNORMAL HIGH (ref 8–23)
CO2: 23 mmol/L (ref 22–32)
Calcium: 9.4 mg/dL (ref 8.9–10.3)
Chloride: 101 mmol/L (ref 98–111)
Creatinine, Ser: 0.96 mg/dL (ref 0.44–1.00)
GFR, Estimated: 57 mL/min — ABNORMAL LOW (ref >60)
Glucose, Bld: 110 mg/dL — ABNORMAL HIGH (ref 70–99)
Potassium: 4.3 mmol/L (ref 3.5–5.1)
Sodium: 135 mmol/L (ref 135–145)

## 2023-09-05 LAB — CBG MONITORING, ED: Glucose-Capillary: 109 mg/dL — ABNORMAL HIGH (ref 70–99)

## 2023-09-05 NOTE — Discharge Instructions (Signed)
As discussed, your evaluation today has been largely reassuring.  But, it is important that you monitor your condition carefully, and do not hesitate to return to the ED if you develop new, or concerning changes in your condition. ? ?Otherwise, please follow-up with your physician for appropriate ongoing care. ? ?

## 2023-09-05 NOTE — ED Provider Notes (Signed)
[facility] AT [facility] Provider Note   CSN: [id number] Arrival date & time: 09/05/23  1008     History  Chief Complaint  Patient presents with   Dizziness   Weakness   Near Syncope    Zoe [name] is a 89 y.o. female.  HPI Presents after episode of dizziness, lightheadedness. She notes that she was in her usual state of health when she stood upright, he felt lightheaded, dizzy.  She does have multiple myeloma, but states that she is feeling generally well prior to this.  No weakness in extremities, no speech difficulty, no vision changes.    Home Medications Prior to Admission medications   Medication Sig Start Date End Date Taking? Authorizing Provider  acyclovir (ZOVIRAX) 400 MG tablet Take 1 tablet (400 mg total) by mouth 2 (two) times daily. 03/16/23  Yes Jaci Standard, MD  albuterol (VENTOLIN HFA) 108 (90 Base) MCG/ACT inhaler Inhale 2 puffs into the lungs every 6 (six) hours as needed for wheezing or shortness of breath. 05/10/23  Yes Luciano Cutter, MD  amiodarone (PACERONE) 200 MG tablet Take 1 tablet (200 mg total) by mouth daily. Patient taking differently: Take 100 mg by mouth daily. 04/20/23  Yes Yates Decamp, MD  apixaban (ELIQUIS) 5 MG TABS tablet Take 1 tablet (5 mg total) by mouth 2 (two) times daily. 03/01/23 09/05/23 Yes Yates Decamp, MD  Artificial Tear Solution (SOOTHE XP) SOLN Place 1 drop into both eyes every evening.   Yes [provider]  Cholecalciferol (VITAMIN D3) 50 MCG (2000 UT) capsule Take 1 capsule (2,000 Units total) by mouth daily. 09/11/22  Yes Jaci Standard, MD  ciprofloxacin (CILOXAN) 0.3 % ophthalmic solution Place 1 drop into the right eye in the morning, at noon, in the evening, and at bedtime. FOR 2 DAYS AFTER EACH MONTHLY EYE INJECTION 08/22/23  Yes [provider]  [employer] VITAMIN B12 1000 MCG tablet TAKE 1 TABLET BY MOUTH EVERY DAY 02/15/23  Yes [name] T, PA-C  dexamethasone  (DECADRON) 4 MG tablet Take 5 tablets (20 mg total) by mouth every 14 (fourteen) days. Take in the morning with food on treatment days Patient taking differently: Take 20 mg by mouth every 21 ( twenty-one) days. 06/01/23  Yes Jaci Standard, MD  docusate sodium (COLACE) 100 MG capsule Take 100 mg by mouth daily as needed for mild constipation.   Yes [provider]  lenalidomide (REVLIMID) 10 MG capsule Take 1 capsule (10 mg total) by mouth daily. [employer] Auth #  [id number] Date Obtained 08/24/23 Take 1 capsule daily for 21 days then none for 7 days 08/24/23  Yes Jaci Standard, MD  levothyroxine (SYNTHROID, LEVOTHROID) 100 MCG tablet Take 100 mcg by mouth daily before breakfast. 12/29/15  Yes [provider]  losartan (COZAAR) 25 MG tablet Take 1 tablet (25 mg total) by mouth daily. 07/17/23  Yes Azucena Fallen, MD  metoprolol tartrate (LOPRESSOR) 50 MG tablet TAKE 1 TABLET BY MOUTH TWICE A DAY 07/10/23  Yes Yates Decamp, MD  Multiple Vitamins-Minerals (PRESERVISION AREDS) CAPS Take 1 capsule by mouth 2 (two) times daily.   Yes [provider]  polyethylene glycol (MIRALAX / GLYCOLAX) 17 g packet Take 17 g by mouth daily as needed for mild constipation.   Yes [provider]  pravastatin (PRAVACHOL) 40 MG tablet Take 40 mg by mouth every evening.   Yes [provider]  spironolactone (ALDACTONE)  25 MG tablet Take 1 tablet (25 mg total) by mouth daily. 07/17/23  Yes Azucena Fallen, MD      Allergies    Quinolones, Penicillins, and Sulfa antibiotics    Review of Systems   Review of Systems  Physical Exam Updated Vital Signs BP (!) 135/97   Pulse 77   Temp 97.9 F (36.6 C) (Oral)   Resp 16   Ht 5\' 3"  (1.6 m)   Wt 67.6 kg   SpO2 100%   BMI 26.39 kg/m  Physical Exam Vitals and nursing note reviewed.  Constitutional:      General: She is not in acute distress.    Appearance: She is well-developed.  HENT:     Head: Normocephalic and  atraumatic.  Eyes:     Conjunctiva/sclera: Conjunctivae normal.  Cardiovascular:     Rate and Rhythm: Normal rate and regular rhythm.  Pulmonary:     Effort: Pulmonary effort is normal. No respiratory distress.     Breath sounds: Normal breath sounds. No stridor.  Abdominal:     General: There is no distension.  Skin:    General: Skin is warm and dry.  Neurological:     General: No focal deficit present.     Mental Status: She is alert and oriented to person, place, and time.     Cranial Nerves: No cranial nerve deficit.     Motor: No weakness.  Psychiatric:        Mood and Affect: Mood normal.     ED Results / Procedures / Treatments   Labs (all labs ordered are listed, but only abnormal results are displayed) Labs Reviewed  BASIC METABOLIC PANEL - Abnormal; Notable for the following components:      Result Value   Glucose, Bld 110 (*)    BUN 28 (*)    GFR, Estimated 57 (*)    All other components within normal limits  CBC - Abnormal; Notable for the following components:   HCT 46.3 (*)    RDW 17.9 (*)    Platelets 123 (*)    All other components within normal limits  URINALYSIS, ROUTINE W REFLEX MICROSCOPIC - Abnormal; Notable for the following components:   APPearance CLOUDY (*)    All other components within normal limits  CBG MONITORING, ED - Abnormal; Notable for the following components:   Glucose-Capillary 109 (*)    All other components within normal limits    EKG EKG Interpretation Date/Time:  Wednesday September 05 2023 10:30:59 EST Ventricular Rate:  82 PR Interval:  192 QRS Duration:  78 QT Interval:  384 QTC Calculation: 448 R Axis:   45  Text Interpretation: Normal sinus rhythm Normal ECG When compared with ECG of 05-Sep-2023 10:29, PREVIOUS ECG IS PRESENT Confirmed by Gerhard Munch [phone]) on 09/05/2023 11:04:11 AM  Radiology CT Head Wo Contrast  Result Date: 09/05/2023 CLINICAL DATA:  Provided history: Headache, neuro deficit. Dizziness.  EXAM: CT HEAD WITHOUT CONTRAST TECHNIQUE: Contiguous axial images were obtained from the base of the skull through the vertex without intravenous contrast. RADIATION DOSE REDUCTION: This exam was performed according to the departmental dose-optimization program which includes automated exposure control, adjustment of the mA and/or kV according to patient size and/or use of iterative reconstruction technique. COMPARISON:  Head CT 04/13/2023.  Brain MRI 03/13/2023. FINDINGS: Brain: Generalized cerebral atrophy. Patchy and ill-defined hypoattenuation within the cerebral white matter, nonspecific but compatible with mild chronic small vessel ischemic disease. There is no acute intracranial hemorrhage. No  demarcated cortical infarct. No extra-axial fluid collection. No evidence of an intracranial mass. No midline shift. Vascular: No hyperdense vessel.  Atherosclerotic calcifications. Skull: No calvarial fracture or aggressive osseous lesion. Sinuses/Orbits: No mass or acute finding within the imaged orbits. Post-surgical appearance of the paranasal sinuses. Tiny left maxillary sinus mucous retention cyst. Minimal mucosal thickening scattered elsewhere within the paranasal sinuses. IMPRESSION: 1. No evidence of an acute intracranial abnormality. 2. Parenchymal atrophy and chronic small vessel ischemic disease. 3. Mild paranasal sinus disease as described. Electronically Signed   By: Jackey Loge D.O.   On: 09/05/2023 12:59    Procedures Procedures    Medications Ordered in ED Medications - No data to display  ED Course/ Medical Decision Making/ A&P                                 Medical Decision Making Elderly female multiple myeloma presents with sudden onset dizziness, lightheadedness, after standing up quickly.  Given her risk profile for stroke, this is a consideration but she is on Eliquis, lowering her risk profile. Patient's neuroexam is otherwise reassuring as well.  However given this, and other  considerations including electrolytes, progression of disease, mass, bleed, patient had CT, labs. Cardiac 80 sinus normal Pulse ox 100% room air normal   Amount and/or Complexity of Data Reviewed Independent Historian: EMS External Data Reviewed: notes. Labs: ordered. Decision-making details documented in ED Course. Radiology: ordered and independent interpretation performed. Decision-making details documented in ED Course. ECG/medicine tests: ordered and independent interpretation performed. Decision-making details documented in ED Course.  Risk Prescription drug management. Decision regarding hospitalization. Diagnosis or treatment significantly limited by social determinants of health.   3:02 PM Patient awake, alert, in no distress, blood pressure unremarkable, no ongoing complaints, we discussed all findings have reviewed the patient CT, labs, no evidence for acute stroke, and given the patient description of symptoms began after standing upright quickly, have essentially extinguished, and absence of focal neurofindings on exam, low suspicion for stroke.  Patient is also on Eliquis.  Patient has scheduled follow-up tomorrow with her hematology team, will discuss her presentation, interval.  Tomorrow.  Absent other acute findings, with stable platelets, unremarkable electrolytes, no evidence for mass, hemorrhage, stroke, patient discharged in stable condition.        Final Clinical Impression(s) / ED Diagnoses Final diagnoses:  Near syncope     Gerhard Munch, MD 09/05/23 [phone]

## 2023-09-05 NOTE — ED Triage Notes (Addendum)
Pt. Stated, this morning when I went to get the paper and when I did I sort of got dizziness and almost fell back . I felt dizzy and near pass out and whoozy some. Im just not feeling the normal. Pt lives by herself. Called her grandson to bring her here.. Pt has not taken any of her medications this morning

## 2023-09-05 NOTE — ED Notes (Signed)
Patient transported to CT 

## 2023-09-06 ENCOUNTER — Inpatient Hospital Stay: Payer: Medicare Other | Admitting: Hematology and Oncology

## 2023-09-06 ENCOUNTER — Other Ambulatory Visit: Payer: Self-pay | Admitting: Hematology and Oncology

## 2023-09-06 ENCOUNTER — Inpatient Hospital Stay: Payer: Medicare Other

## 2023-09-06 VITALS — BP 121/71 | HR 65 | Temp 97.9°F | Resp 13 | Wt 150.4 lb

## 2023-09-06 DIAGNOSIS — D696 Thrombocytopenia, unspecified: Secondary | ICD-10-CM | POA: Diagnosis not present

## 2023-09-06 DIAGNOSIS — E538 Deficiency of other specified B group vitamins: Secondary | ICD-10-CM | POA: Diagnosis not present

## 2023-09-06 DIAGNOSIS — C9000 Multiple myeloma not having achieved remission: Secondary | ICD-10-CM | POA: Diagnosis not present

## 2023-09-06 DIAGNOSIS — D6959 Other secondary thrombocytopenia: Secondary | ICD-10-CM | POA: Diagnosis not present

## 2023-09-06 DIAGNOSIS — D649 Anemia, unspecified: Secondary | ICD-10-CM | POA: Diagnosis not present

## 2023-09-06 DIAGNOSIS — Z7901 Long term (current) use of anticoagulants: Secondary | ICD-10-CM | POA: Diagnosis not present

## 2023-09-06 DIAGNOSIS — R251 Tremor, unspecified: Secondary | ICD-10-CM | POA: Diagnosis not present

## 2023-09-06 DIAGNOSIS — I959 Hypotension, unspecified: Secondary | ICD-10-CM | POA: Diagnosis not present

## 2023-09-06 DIAGNOSIS — I4891 Unspecified atrial fibrillation: Secondary | ICD-10-CM | POA: Diagnosis not present

## 2023-09-06 DIAGNOSIS — R600 Localized edema: Secondary | ICD-10-CM | POA: Diagnosis not present

## 2023-09-06 DIAGNOSIS — G629 Polyneuropathy, unspecified: Secondary | ICD-10-CM | POA: Diagnosis not present

## 2023-09-06 LAB — CBC WITH DIFFERENTIAL (CANCER CENTER ONLY)
Abs Immature Granulocytes: 0.02 10*3/uL (ref 0.00–0.07)
Basophils Absolute: 0 10*3/uL (ref 0.0–0.1)
Basophils Relative: 1 %
Eosinophils Absolute: 0.1 10*3/uL (ref 0.0–0.5)
Eosinophils Relative: 2 %
HCT: 42.1 % (ref 36.0–46.0)
Hemoglobin: 14.6 g/dL (ref 12.0–15.0)
Immature Granulocytes: 0 %
Lymphocytes Relative: 12 %
Lymphs Abs: 0.7 10*3/uL (ref 0.7–4.0)
MCH: 31.3 pg (ref 26.0–34.0)
MCHC: 34.7 g/dL (ref 30.0–36.0)
MCV: 90.3 fL (ref 80.0–100.0)
Monocytes Absolute: 0.8 10*3/uL (ref 0.1–1.0)
Monocytes Relative: 13 %
Neutro Abs: 4 10*3/uL (ref 1.7–7.7)
Neutrophils Relative %: 72 %
Platelet Count: 141 10*3/uL — ABNORMAL LOW (ref 150–400)
RBC: 4.66 MIL/uL (ref 3.87–5.11)
RDW: 17.8 % — ABNORMAL HIGH (ref 11.5–15.5)
WBC Count: 5.6 10*3/uL (ref 4.0–10.5)
nRBC: 0 % (ref 0.0–0.2)

## 2023-09-06 LAB — CMP (CANCER CENTER ONLY)
ALT: 10 U/L (ref 0–44)
AST: 14 U/L — ABNORMAL LOW (ref 15–41)
Albumin: 4 g/dL (ref 3.5–5.0)
Alkaline Phosphatase: 101 U/L (ref 38–126)
Anion gap: 8 (ref 5–15)
BUN: 30 mg/dL — ABNORMAL HIGH (ref 8–23)
CO2: 27 mmol/L (ref 22–32)
Calcium: 9.4 mg/dL (ref 8.9–10.3)
Chloride: 100 mmol/L (ref 98–111)
Creatinine: 1.16 mg/dL — ABNORMAL HIGH (ref 0.44–1.00)
GFR, Estimated: 46 mL/min — ABNORMAL LOW (ref >60)
Glucose, Bld: 112 mg/dL — ABNORMAL HIGH (ref 70–99)
Potassium: 4.3 mmol/L (ref 3.5–5.1)
Sodium: 135 mmol/L (ref 135–145)
Total Bilirubin: 0.7 mg/dL (ref <1.2)
Total Protein: 6.2 g/dL — ABNORMAL LOW (ref 6.5–8.1)

## 2023-09-06 LAB — LACTATE DEHYDROGENASE: LDH: 229 U/L — ABNORMAL HIGH (ref 98–192)

## 2023-09-06 NOTE — Progress Notes (Signed)
[facility] [phone]   [phone]   PROGRESS NOTE   Patient Care Team: [facility] as PCP - General (Rheumatology)   Hematological/Oncological History # IgG Lambda Multiple Myeloma  06/14/2022: establish care with Zoe [name] due to anemia. Labs showed M protein 4.9, Kappa 7.2, Lambda 10.8, ratio 0.67 07/13/2022: Bmbx showed Lambda restricted plasma cell neoplasm involving approximately 60% of the cellular marrow by IHC on the biopsy.  08/01/2022: Cycle 1 Day 1 of VRd chemotherapy. (Holding revlimid initially) 08/21/2022:  Cycle 2 Day 1 of VRd chemotherapy. (Holding revlimid) 09/04/2022: Cycle 3 Day 1 of VRd chemotherapy. Started revlimid 10/03/2022: Cycle 4 Day 1 of VRd chemotherapy 10/31/2022: Cycle 5 Day 1 of VRd chemotherapy 11/20/2022: Cycle 6 Day 1 of VRd chemotherapy 12/11/2022: Cycle 7 Day 1 of VRd chemotherapy 01/02/2023: Cycle 8 Day 1 of VRd chemotherapy 01/22/2023: Cycle 9 Day 1 of VRd chemotherapy 02/23/2023: Day 1 Cycle 1 of Dara/Rev/Dex 03/23/2023: Day 1 Cycle 2 of Dara/Rev/Dex 05/06/2023: chemotherapy on pause due to chest pain/cardiac/respiratory issues.  05/18/2023: Cycle 3 Day 1 of Dara/Rev/Dex 06/15/2023: Cycle 4 Day 1 of Dara/Rev/Dex 07/13/2023: Cycle 5 Day 1 of Dara/Rev/Dex 08/10/2023: Cycle 6 Day 1 of Dara/Rev/Dex. Transition to maintenance revlimid.     INTERVAL HISTORY: Zoe 89 y.o. female returns to the clinic today for a follow-up visit accompanied by her daughter-in-law. Her last visit was on 08/10/2023. In the interim, she continued on maintenance revlimid.   Zoe [name] reports she did go to the emergency room yesterday due to dizziness.  She reports that she bent over to pick up a piece of paper and when she stood up she felt quite dizzy and fell back against the door.  She reports that it took longer than she anticipated to resolve and so she went to the emergency department.  In the ED no  abnormalities were found.  She reports there is no trouble with her EKG or CT scan.  She reports that she has been "really lazy and resting" since this happened.  Otherwise she notes that she is tolerating the Revlimid therapy well.  She is not having any nausea, vomiting, or diarrhea.  She reports that her energy levels overall are good and her appetite is strong.  She is not having any new bone pain or back pain.  A full 10 point ROS is otherwise negative.  She denies any fevers, chills, sweats, shortness of breath, chest pain or cough.  She has no other complaints.  A full 10 point ROS is otherwise negative.  MEDICAL HISTORY: Past Medical History:  Diagnosis Date   Arthritis    some - per patient   Breast cancer (HCC)    breast cancer / left    Cataract    bilat    GERD (gastroesophageal reflux disease)    History of kidney stones    Hyperlipidemia    Hypertension    Hypothyroidism    Macular degeneration    Left   S/P TAVR (transcatheter aortic valve replacement) 09/03/2018   23 mm Edwards Sapien 3 transcatheter heart valve placed via percutaneous right transfemoral approach    Severe aortic stenosis    Stress incontinence    Thyroid disease    Tinnitus     ALLERGIES:  is allergic to quinolones, penicillins, and sulfa antibiotics.  MEDICATIONS:  Current Outpatient Medications  Medication Sig Dispense Refill   acyclovir (ZOVIRAX) 400 MG tablet Take 1 tablet (400 mg total) by mouth  2 (two) times daily. 60 tablet 3   albuterol (VENTOLIN HFA) 108 (90 Base) MCG/ACT inhaler Inhale 2 puffs into the lungs every 6 (six) hours as needed for wheezing or shortness of breath. 8 g 2   amiodarone (PACERONE) 200 MG tablet Take 1 tablet (200 mg total) by mouth daily. (Patient taking differently: Take 100 mg by mouth daily.) 45 tablet 1   apixaban (ELIQUIS) 5 MG TABS tablet Take 1 tablet (5 mg total) by mouth 2 (two) times daily. 180 tablet 1   Artificial Tear Solution (SOOTHE XP) SOLN Place 1  drop into both eyes every evening.     Cholecalciferol (VITAMIN D3) 50 MCG (2000 UT) capsule Take 1 capsule (2,000 Units total) by mouth daily.     ciprofloxacin (CILOXAN) 0.3 % ophthalmic solution Place 1 drop into the right eye in the morning, at noon, in the evening, and at bedtime. FOR 2 DAYS AFTER EACH MONTHLY EYE INJECTION     [employer] VITAMIN B12 1000 MCG tablet TAKE 1 TABLET BY MOUTH EVERY DAY 90 tablet 1   dexamethasone (DECADRON) 4 MG tablet Take 5 tablets (20 mg total) by mouth every 14 (fourteen) days. Take in the morning with food on treatment days (Patient taking differently: Take 20 mg by mouth every 21 ( twenty-one) days.) 30 tablet 2   docusate sodium (COLACE) 100 MG capsule Take 100 mg by mouth daily as needed for mild constipation.     lenalidomide (REVLIMID) 10 MG capsule Take 1 capsule (10 mg total) by mouth daily. [employer] Auth #  [id number] Date Obtained 08/24/23 Take 1 capsule daily for 21 days then none for 7 days 21 capsule 0   levothyroxine (SYNTHROID, LEVOTHROID) 100 MCG tablet Take 100 mcg by mouth daily before breakfast.  2   losartan (COZAAR) 25 MG tablet Take 1 tablet (25 mg total) by mouth daily. 30 tablet 2   metoprolol tartrate (LOPRESSOR) 50 MG tablet TAKE 1 TABLET BY MOUTH TWICE A DAY 60 tablet 1   Multiple Vitamins-Minerals (PRESERVISION AREDS) CAPS Take 1 capsule by mouth 2 (two) times daily.     polyethylene glycol (MIRALAX / GLYCOLAX) 17 g packet Take 17 g by mouth daily as needed for mild constipation.     pravastatin (PRAVACHOL) 40 MG tablet Take 40 mg by mouth every evening.     spironolactone (ALDACTONE) 25 MG tablet Take 1 tablet (25 mg total) by mouth daily. 30 tablet 2   No current facility-administered medications for this visit.    SURGICAL HISTORY:  Past Surgical History:  Procedure Laterality Date   ABDOMINAL HYSTERECTOMY  1970's   BACK SURGERY     BREAST LUMPECTOMY  12/1998   lumpectomy   CARDIAC CATHETERIZATION     CARDIOVERSION N/A 04/18/2023    Procedure: CARDIOVERSION;  Surgeon: Tessa Lerner, DO;  Location: [facility] INVASIVE CV LAB;  Service: Cardiovascular;  Laterality: N/A;   CARDIOVERSION N/A 04/20/2023   Procedure: CARDIOVERSION;  Surgeon: Yates Decamp, MD;  Location: [facility] INVASIVE CV LAB;  Service: Cardiovascular;  Laterality: N/A;   EYE SURGERY     cataract surgery bilat    INTRAOPERATIVE TRANSTHORACIC ECHOCARDIOGRAM N/A 09/03/2018   Procedure: INTRAOPERATIVE TRANSTHORACIC ECHOCARDIOGRAM;  Surgeon: Kathleene Hazel, MD;  Location: [facility] OR;  Service: Open Heart Surgery;  Laterality: N/A;   KYPHOPLASTY N/A 09/07/2022   Procedure: THORACIC EIGHT KYPHOPLASTY;  Surgeon: Venita Lick, MD;  Location: [facility] OR;  Service: Orthopedics;  Laterality: N/A;  1 hr Local with [facility] 3 C-Bed  LITHOTRIPSY     Right total knee     2018 Dr. Charlann Boxer   RIGHT/LEFT HEART CATH AND CORONARY ANGIOGRAPHY N/A 08/06/2018   Procedure: RIGHT/LEFT HEART CATH AND CORONARY ANGIOGRAPHY;  Surgeon: Yates Decamp, MD;  Location: [facility] INVASIVE CV LAB;  Service: Cardiovascular;  Laterality: N/A;   THYROIDECTOMY, PARTIAL  1975   TONSILLECTOMY     as a child - patient not sure of exact date   TOTAL KNEE ARTHROPLASTY Left 03/13/2016   Procedure: TOTAL KNEE ARTHROPLASTY;  Surgeon: Durene Romans, MD;  Location: [facility] ORS;  Service: Orthopedics;  Laterality: Left;   TOTAL KNEE ARTHROPLASTY Right 06/18/2017   Procedure: RIGHT TOTAL KNEE ARTHROPLASTY;  Surgeon: Durene Romans, MD;  Location: [facility] ORS;  Service: Orthopedics;  Laterality: Right;   TRANSCATHETER AORTIC VALVE REPLACEMENT, TRANSFEMORAL N/A 09/03/2018   Procedure: TRANSCATHETER AORTIC VALVE REPLACEMENT, TRANSFEMORAL;  Surgeon: Kathleene Hazel, MD;  Location: [facility] OR;  Service: Open Heart Surgery;  Laterality: N/A;   REVIEW OF SYSTEMS:   Constitutional: ( - ) fevers, ( - )  chills , ( - ) night sweats Eyes: ( - ) blurriness of vision, ( - ) double vision, ( - ) watery eyes Ears, nose, mouth, throat, and face: ( - )  mucositis, ( - ) sore throat Respiratory: ( -) cough, ( - ) dyspnea, ( - ) wheezes Cardiovascular: ( - ) palpitation, ( - ) chest discomfort, ( +) lower extremity swelling Gastrointestinal:  ( - ) nausea, ( - ) heartburn, ( - ) change in bowel habits Skin: ( - ) abnormal skin rashes Lymphatics: ( - ) new lymphadenopathy, ( - ) easy bruising Neurological: ( - ) numbness, ( - ) tingling, ( - ) new weaknesses Behavioral/Psych: ( - ) mood change, ( - ) new changes  All other systems were reviewed with the patient and are negative.   PHYSICAL EXAMINATION:  There were no vitals taken for this visit.  ECOG PERFORMANCE STATUS: 1  GENERAL: well appearing elderly Caucasian female, alert, no distress and comfortable SKIN: skin color, texture, turgor are normal, no rashes or significant lesions EYES: conjunctiva are pink and non-injected, sclera clear LUNGS:normal breathing effort. Mild wheezing and crackles in lower lobes b/l.  HEART: irregular rhythm ,normal rate and no murmurs.  Musculoskeletal: no cyanosis of digits and no clubbing  PSYCH: alert & oriented x 3, fluent speech NEURO: no focal motor/sensory deficits    LABORATORY DATA: Lab Results  Component Value Date   WBC 5.4 09/05/2023   HGB 14.9 09/05/2023   HCT 46.3 (H) 09/05/2023   MCV 93.3 09/05/2023   PLT 123 (L) 09/05/2023      Chemistry      Component Value Date/Time   NA 135 09/05/2023 1037   NA 130 (L) 06/19/2022 1012   K 4.3 09/05/2023 1037   CL 101 09/05/2023 1037   CO2 23 09/05/2023 1037   BUN 28 (H) 09/05/2023 1037   BUN 21 06/19/2022 1012   CREATININE 0.96 09/05/2023 1037   CREATININE 0.93 08/17/2023 0936      Component Value Date/Time   CALCIUM 9.4 09/05/2023 1037   ALKPHOS 69 08/17/2023 0936   AST 12 (L) 08/17/2023 0936   ALT 13 08/17/2023 0936   BILITOT 0.4 08/17/2023 0936       RADIOGRAPHIC STUDIES:  CT Head Wo Contrast  Result Date: 09/05/2023 CLINICAL DATA:  Provided history: Headache,  neuro deficit. Dizziness. EXAM: CT HEAD WITHOUT CONTRAST TECHNIQUE: Contiguous axial images were obtained from  the base of the skull through the vertex without intravenous contrast. RADIATION DOSE REDUCTION: This exam was performed according to the departmental dose-optimization program which includes automated exposure control, adjustment of the mA and/or kV according to patient size and/or use of iterative reconstruction technique. COMPARISON:  Head CT 04/13/2023.  Brain MRI 03/13/2023. FINDINGS: Brain: Generalized cerebral atrophy. Patchy and ill-defined hypoattenuation within the cerebral white matter, nonspecific but compatible with mild chronic small vessel ischemic disease. There is no acute intracranial hemorrhage. No demarcated cortical infarct. No extra-axial fluid collection. No evidence of an intracranial mass. No midline shift. Vascular: No hyperdense vessel.  Atherosclerotic calcifications. Skull: No calvarial fracture or aggressive osseous lesion. Sinuses/Orbits: No mass or acute finding within the imaged orbits. Post-surgical appearance of the paranasal sinuses. Tiny left maxillary sinus mucous retention cyst. Minimal mucosal thickening scattered elsewhere within the paranasal sinuses. IMPRESSION: 1. No evidence of an acute intracranial abnormality. 2. Parenchymal atrophy and chronic small vessel ischemic disease. 3. Mild paranasal sinus disease as described. Electronically Signed   By: Jackey Loge D.O.   On: 09/05/2023 12:59     ASSESSMENT/PLAN:  SHAIRA MALONY is a 89 y.o. female who presents to the clinic for evaluation for IgG lambda multiple myeloma.   # IgG Lambda Multiple Myeloma  --diagnosis of MM confirmed with bone marrow biopsy showing 60% plasma cells and anemia -- Bone survey shows no lytic lesions, kidney function is within normal limits.  --08/01/2022 was Cycle 1 Day 1 of VRd  -Dr. Leonides Schanz discontinued velcade on 02/05/23 due to rash. She is status post day 15 cycle #9.   -Dr. Leonides Schanz changed the care plan to Daratumumab, Revlimid, and decadron. She is scheduled for day 1 cycle #1 today  -Started Cycle 1, Day 1 of Dara/Rev/Dex on 02/23/2023.  Plan:  --Labs show white blood cell count white blood cell 5.6, hemoglobin 14.6, MCV 90.3, platelets 141 --last M protein from 07/13/2023 trended down to 0.1 (4.9 prior to start of therapy). Dara IFE shows no M protein detected. Normalized SFLC.  --Due to worsening thrombocytopenia, held Revlmid temporarily.  Restarting now at 10 mg p.o. daily.  Close monitoring to assure no worsening blood counts. --Strict precautions for bleeding given.  --RTC in 2 week for lab check and 4 weeks for clinic visit   #Dysuria/increased urinary frequency: --Recently completed Keflex therapy for UTI, still complaining of occasional dysuria and urgency.  #Hypotension/BP/Atrial Fibrillation  --Follows with cardiology, Dr. Jacinto Halim -- Recommendations include metoprolol succinate 25 mg PO daily and  amiodarone 100 mg PO daily --Continue on Eliquis 5 mg BID   #Bilateral lower extremity edema:  --Continue to wear compression stockings.    # Normocytic Anemia #Vitamin B12 Deficiency --Anemia likely driven by multiple myeloma, but may be component of vitamin B12 deficiency as well. --initial labs showed elevated MMA with B12 180 -- Continue vitamin B12 1000 mcg p.o. daily -- Continue to monitor   #Left leg/thigh neuropathy--improving: --Currently on gabapentin 900 mg nightly and 600 mg in the morning --Encouraged to try water aerobics and stretches.  --Following with Dr. Debbe Bales (ortho) to see if she is a candidate for steroid injections.  -- Patient evaluated by Dr. Barbaraann Cao thinks that this may be neuropathy worsened by her Velcade therapy.   #Episode of right UE tremor and speech abnormality: --Evaluated by Dr. Barbaraann Cao on 03/20/2023. Presumed etiology had been atrial fibrillation, but vascular disease, atheromatous changes, are also possibly  implicated concurrently.  --MRI brain from 03/13/2023 show no acute finding  including no evidence of intracranial neoplastic disease. There are chronic small-vessel ischemic changes of the pons, cerebral hemispheric white matter and right cerebellum. --Educated patient to seek immediate evaluate if there are repeat episodes. --No neurological deficits identified per exam today  #Supportive Care -- chemotherapy education complete.  -- port placement not required.  --Awaiting to start Zometa/Xgeva  -- zofran 8mg  q8H PRN and compazine 10mg  PO q6H for nausea -- acyclovir 400mg  PO BID for VCZ prophylaxis -- tylenol 1000 mg q8H PRN for back pain.   No orders of the defined types were placed in this encounter.   Patient expressed understanding of the plan provided.   I have spent a total of 30 minutes minutes of face-to-face and non-face-to-face time, preparing to see the patient, performing a medically appropriate examination, counseling and educating the patient, ordering tests/procedures, documenting clinical information in the electronic health record, and care coordination.   Ulysees Barns, MD Department of Hematology/Oncology [facility] Phone: [phone] Pager: [phone] Email: [email or web address]

## 2023-09-07 LAB — KAPPA/LAMBDA LIGHT CHAINS
Kappa free light chain: 6.3 mg/L (ref 3.3–19.4)
Kappa, lambda light chain ratio: 1.4 (ref 0.26–1.65)
Lambda free light chains: 4.5 mg/L — ABNORMAL LOW (ref 5.7–26.3)

## 2023-09-10 LAB — MULTIPLE MYELOMA PANEL, SERUM
Albumin SerPl Elph-Mcnc: 3.6 g/dL (ref 2.9–4.4)
Albumin/Glob SerPl: 1.8 — ABNORMAL HIGH (ref 0.7–1.7)
Alpha 1: 0.2 g/dL (ref 0.0–0.4)
Alpha2 Glob SerPl Elph-Mcnc: 0.8 g/dL (ref 0.4–1.0)
B-Globulin SerPl Elph-Mcnc: 0.8 g/dL (ref 0.7–1.3)
Gamma Glob SerPl Elph-Mcnc: 0.3 g/dL — ABNORMAL LOW (ref 0.4–1.8)
Globulin, Total: 2.1 g/dL — ABNORMAL LOW (ref 2.2–3.9)
IgA: 42 mg/dL — ABNORMAL LOW (ref 64–422)
IgG (Immunoglobin G), Serum: 380 mg/dL — ABNORMAL LOW (ref 586–1602)
IgM (Immunoglobulin M), Srm: 25 mg/dL — ABNORMAL LOW (ref 26–217)
M Protein SerPl Elph-Mcnc: 0.1 g/dL — ABNORMAL HIGH
Total Protein ELP: 5.7 g/dL — ABNORMAL LOW (ref 6.0–8.5)

## 2023-09-11 ENCOUNTER — Ambulatory Visit: Payer: Medicare Other

## 2023-09-11 VITALS — BP 135/69 | HR 61

## 2023-09-11 DIAGNOSIS — R2689 Other abnormalities of gait and mobility: Secondary | ICD-10-CM | POA: Diagnosis not present

## 2023-09-11 DIAGNOSIS — R262 Difficulty in walking, not elsewhere classified: Secondary | ICD-10-CM | POA: Diagnosis not present

## 2023-09-11 DIAGNOSIS — M6281 Muscle weakness (generalized): Secondary | ICD-10-CM

## 2023-09-11 DIAGNOSIS — C9000 Multiple myeloma not having achieved remission: Secondary | ICD-10-CM | POA: Diagnosis not present

## 2023-09-11 NOTE — Therapy (Signed)
OUTPATIENT PHYSICAL THERAPY  LOWER EXTREMITY ONCOLOGY TREATMENT  Patient Name: Zoe [name] MRN: [id number] DOB:1934-01-05, 89 y.o., female Today's Date: 09/11/2023  END OF SESSION:  PT End of Session - 09/11/23 1006     Visit Number 12    Number of Visits 16    Date for PT Re-Evaluation 09/13/23    Authorization Type [employer]    Authorization Time Period 9/16-12/28/2024 (Medicare 10th visit note @ 18  )    Authorization - Visit Number 11    Authorization - Number of Visits 12    PT Start Time 1002    PT Stop Time 1044    PT Time Calculation (min) 42 min    Activity Tolerance Patient tolerated treatment well;Patient limited by fatigue    Behavior During Therapy WFL for tasks assessed/performed              Past Medical History:  Diagnosis Date   Arthritis    some - per patient   Breast cancer (HCC)    breast cancer / left    Cataract    bilat    GERD (gastroesophageal reflux disease)    History of kidney stones    Hyperlipidemia    Hypertension    Hypothyroidism    Macular degeneration    Left   S/P TAVR (transcatheter aortic valve replacement) 09/03/2018   23 mm Edwards Sapien 3 transcatheter heart valve placed via percutaneous right transfemoral approach    Severe aortic stenosis    Stress incontinence    Thyroid disease    Tinnitus    Past Surgical History:  Procedure Laterality Date   ABDOMINAL HYSTERECTOMY  1970's   BACK SURGERY     BREAST LUMPECTOMY  12/1998   lumpectomy   CARDIAC CATHETERIZATION     CARDIOVERSION N/A 04/18/2023   Procedure: CARDIOVERSION;  Surgeon: Tessa Lerner, DO;  Location: [facility] INVASIVE CV LAB;  Service: Cardiovascular;  Laterality: N/A;   CARDIOVERSION N/A 04/20/2023   Procedure: CARDIOVERSION;  Surgeon: Yates Decamp, MD;  Location: [facility] INVASIVE CV LAB;  Service: Cardiovascular;  Laterality: N/A;   EYE SURGERY     cataract surgery bilat    INTRAOPERATIVE TRANSTHORACIC ECHOCARDIOGRAM N/A 09/03/2018   Procedure: INTRAOPERATIVE  TRANSTHORACIC ECHOCARDIOGRAM;  Surgeon: Kathleene Hazel, MD;  Location: [facility] OR;  Service: Open Heart Surgery;  Laterality: N/A;   KYPHOPLASTY N/A 09/07/2022   Procedure: THORACIC EIGHT KYPHOPLASTY;  Surgeon: Venita Lick, MD;  Location: [facility] OR;  Service: Orthopedics;  Laterality: N/A;  1 hr Local with [facility] 3 C-Bed   LITHOTRIPSY     Right total knee     2018 Dr. Charlann Boxer   RIGHT/LEFT HEART CATH AND CORONARY ANGIOGRAPHY N/A 08/06/2018   Procedure: RIGHT/LEFT HEART CATH AND CORONARY ANGIOGRAPHY;  Surgeon: Yates Decamp, MD;  Location: [facility] INVASIVE CV LAB;  Service: Cardiovascular;  Laterality: N/A;   THYROIDECTOMY, PARTIAL  1975   TONSILLECTOMY     as a child - patient not sure of exact date   TOTAL KNEE ARTHROPLASTY Left 03/13/2016   Procedure: TOTAL KNEE ARTHROPLASTY;  Surgeon: Durene Romans, MD;  Location: [facility] ORS;  Service: Orthopedics;  Laterality: Left;   TOTAL KNEE ARTHROPLASTY Right 06/18/2017   Procedure: RIGHT TOTAL KNEE ARTHROPLASTY;  Surgeon: Durene Romans, MD;  Location: [facility] ORS;  Service: Orthopedics;  Laterality: Right;   TRANSCATHETER AORTIC VALVE REPLACEMENT, TRANSFEMORAL N/A 09/03/2018   Procedure: TRANSCATHETER AORTIC VALVE REPLACEMENT, TRANSFEMORAL;  Surgeon: Kathleene Hazel, MD;  Location: [facility] OR;  Service: Open  Heart Surgery;  Laterality: N/A;   Patient Active Problem List   Diagnosis Date Noted   Hypertensive urgency 07/16/2023   Urinary tract infection due to Klebsiella species 07/16/2023   Heart failure with preserved ejection fraction (HCC) 07/16/2023   Hypokalemia 07/16/2023   Hypocalcemia 07/16/2023   Atypical atrial flutter (HCC) 04/19/2023   Acute on chronic diastolic heart failure (HCC) 04/19/2023   Atrial fibrillation with rapid ventricular response (HCC) 04/18/2023   History of stroke 04/16/2023   Fall at home, initial encounter 04/16/2023   Atrial fibrillation (HCC) 04/16/2023   Paroxysmal atrial fibrillation (HCC) 04/15/2023   Elevated  troponin 04/15/2023   Chest pain 04/13/2023   Stroke due to embolism (HCC) 03/20/2023   Chest pain, rule out acute myocardial infarction 02/17/2023   Chemotherapy-induced neuropathy (HCC) 11/28/2022   Pain due to onychomycosis of toenails of both feet 11/08/2022   Multiple myeloma not having achieved remission (HCC) 07/24/2022   Chronic diastolic CHF (congestive heart failure) (HCC) 05/16/2022   Symptomatic anemia 05/16/2022   Hyponatremia 04/02/2022   Microcytic anemia 04/02/2022   S/P TAVR (transcatheter aortic valve replacement) 09/03/2018   Hypothyroidism    Hypertension    Hyperlipidemia    GERD (gastroesophageal reflux disease)    S/P right TKA 06/18/2017   Class 1 obesity 03/15/2016   S/P left TKA 03/13/2016   History of breast cancer, DCIS, lumpectomy March 2000 12/13/2011    PCP: Dr. Irena Reichmann  REFERRING PROVIDER: Dr. Jeanie Sewer, IV  REFERRING DIAG: Multiple Myeloma  THERAPY DIAG:  Multiple myeloma without remission (HCC)  Muscle weakness (generalized)  Difficulty walking  Balance problem  ONSET DATE: 07/2022  Rationale for Evaluation and Treatment: Rehabilitation  SUBJECTIVE:                                                                                                                                                                                           SUBJECTIVE STATEMENT:  I was in the hospital since I was here last for dizziness. They never did figure out what was wrong but my BP was really high. My legs still just feel really sore. I still feel a little off, can you take my BP?     eval I need to get my balance better so I can move without fear of falling. Difficult getting up and down from chairs, Some fatigue with walking short distances;ie car into clinic. Reports some foot numbness if she is cold but feels good with socks on. Her grandson lives with her, but she has a home health aid in the day time and the night time for safety PERTINENT  HISTORY:  Pt was diagnosed with  IgGMultiple Myeloma in 07/2022 after a bone Marrow biopsy showed 60% plasma cells and anemia. No lytic lesions. She was started on chemotherapy. She had surgery for T 8 compression fracture kyphoplasty on 09/07/2022. She was hospitalized 04/15/2023 for Atrial Fibrillation with cardioversion attempted on 04/18/2023. She had some PT at that time. She has a home health aide that drives her to appts and helps with keeping her house clean And someone at night to help her get to the bathroom  PMH significant for A-fib on Eliquis and amiodarone, hypothyroidism, HTN, breast cancer, multiple myeloma,aortic stenosis status post TAVR, hx of TIA, Macular degeneration, Head aches she thinks related to her eyes PAIN:  Are you having pain? No, just a little soreness in my legs  PRECAUTIONS: PMH significant of A-fib on Eliquis and amiodarone, hypothyroidism, HTN, Left breast cancer, multiple myeloma,aortic stenosis status post TAVR, hx of TIA, T8 compression fx with kyphoplasty  RED FLAGS: Bowel or bladder incontinence: Yes:   and Compression fracture: Yes: had a kyphoplasty    WEIGHT BEARING RESTRICTIONS: No  FALLS:  Has patient fallen in last 6 months? Yes. Number of falls 1, fell in community garden: no injury  LIVING ENVIRONMENT: Lives with: Lucila Maine lives with her presently Lives in: House/apartment Stairs: No;  Has following equipment at home: Single point cane, Environmental consultant - 4 wheeled, and shower chair  OCCUPATION: Retired  LEISURE: read,visit with friends  PRIOR LEVEL OF FUNCTION: Independent with basic ADLs, Independent with household mobility with device, and Needs assistance with homemaking  PATIENT GOALS: Be able to walk more securely, not be dependent on a walker/rollator   OBJECTIVE:  COGNITION: Overall cognitive status: deficits  PALPATION:   OBSERVATIONS / OTHER ASSESSMENTS: Ambulates with rollator, flexed posture/increased  kyphosis  SENSATION: Light touch: deficits   POSTURE: forward head, rounded shoulders, increased thoracic kyphosis  LOWER EXTREMITY STRENGTH:  MMT Right eval  Hip flexion 4-  Hip extension Able to bridge  Hip abduction 4-  Hip adduction 4 -seated  Hip internal rotation 3-  Hip external rotation 3+  Knee flexion 4+  Knee extension 4+  Ankle dorsiflexion 5  Ankle plantarflexion   Ankle inversion   Ankle eversion   Great toe extension    (Blank rows = not tested)  MMT LEFT eval  Hip flexion 4  Hip extension Able to bridge  Hip abduction 4-  Hip adduction 4-  Hip internal rotation 3+  Hip external rotation 3  Knee flexion 4+  Knee extension 4  Ankle dorsiflexion 5  Ankle plantarflexion   Ankle inversion   Ankle eversion   Great toe extension     (Blank rows = not tested)  LYMPHEDEMA ASSESSMENTS:   SURGERY TYPE/DATE: Left lumpectomy 2000  NUMBER OF LYMPH NODES REMOVED: Thinks so, not sure how many  CHEMOTHERAPY: Yes for Multiple Myeloma, No for Breast Cancer  RADIATION:No for Breast Cancer  HORMONE TREATMENT: Yes for Breast Cancer  INFECTIONS: NO  LYMPHEDEMA ASSESSMENTS:   LOWER EXTREMITY LANDMARK RIGHT eval  At groin   30 cm proximal to suprapatella   20 cm proximal to suprapatella   10 cm proximal to suprapatella   At midpatella / popliteal crease   30 cm proximal to floor at lateral plantar foot   20 cm proximal to floor at lateral plantar foot   10 cm proximal to floor at lateral plantar foot   Circumference of ankle/heel   5 cm proximal to 1st MTP joint   Across  MTP joint   Around proximal great toe   (Blank rows = not tested)  LOWER EXTREMITY LANDMARK LEFT eval  At groin   30 cm proximal to suprapatella   20 cm proximal to suprapatella   10 cm proximal to suprapatella   At midpatella / popliteal crease   30 cm proximal to floor at lateral plantar foot   20 cm proximal to floor at lateral plantar foot   10 cm proximal to floor at  lateral plantar foot   Circumference of ankle/heel   5 cm proximal to 1st MTP joint   Across MTP joint   Around proximal great toe   (Blank rows = not tested)  FUNCTIONAL TESTS:  30 seconds sit to stand test: 5 repetitions with use of UE's  Timed up and go: 30.4 seconds with rollator and use of hands to rise from chair  Four position balance test:  able to maintain DLS feet together x 10 sec, and half tandem with left foot forward x 10 sec. , unable to hold half tandem with right foot forward for greater than 3 seconds, unable to maintain tandem stance bilaterally for more than a few seconds. Did not test SLS GAIT: Distance walked: Nustep to ramp in front of building Assistive device utilized: Rollator Level of assistance: Modified independence Comments:flexed posture, increased kyphosis    Outcome measure:  TODAY'S TREATMENT:                                                                                                                                          DATE:  09/12/23: Therapeutic Exercises Nu Step seat 8, UE 9 Lev 3 x 8 min, 588 steps Vital signs taken after NuStep at pts request, see Vital Signs In // bars with 1# added to each ankle for following: Heel raises x 10 with HH, x 10 without HH High march x 10 B with fingertip assist Low march x 10 with fingertips, SBA SLS on blue oval for bil hip flex and abd x 10 each (still with 1# on ankles) Seated rest after // bars and removed ankle weights Sit to stand with purple cushion in chair 2 x 5, SBA to CGA (1 posterior LOB to chair) In // bars for multiple trials of modified tandem stance bilaterally in varying positions for 10 sec at a time with fingertip support throughout, was able to get to practicing full tandem today which pt has not felt confident enough to try before. Pt ambulated from cancer gym to ortho gym and returned without rest and with rollator; SBA   09/04/23: Therapeutic Exercises Nu Step seat 8, UE 9  Lev 3 x 8 min, 605 steps In // bars: Heel raises x 10 with HH, x 10 without HH High march x 10 B with fingertip assist Low march x 10 no HH, SBA Vector reaches with handhold x 5 each returning  therapist demo Sit to stand with purple cushion in chair 2 x 5, CGA In // bars for multiple trials of modified tandem stance bilaterally in varying positions for 10 sec at a time working towards no HH On mini tramp for following: High march 2 x 5 each with +2 HH, 3 way weight shifting x 30 sec each +2 HH, then 1 trial partial tandem stance each x 30 sec with fingertips Pt ambulated from cancer gym to ortho gym and returned without rest and with rollator; SBA   08/31/23:  SpO2 98%, HR 61 bpm Therapeutic Exercises Nu Step seat 8, UE 9 Lev 3 x 8 min, 546 steps In // bars:Heel raises x 10 with HH, x 10 without HH High march x 10 B with HH Low march x 10 no HH, CGA UE reaches with SBA-CGA x 5 each side encouraging reaching until it becomes challenging Sit to stand with purple cushion in chair 2 x 5, CGA 6 in step ups 2 x 5 bilaterally with HH Foot placement on 6 in step forward 2 x 5 ea with second set no HH and sideways bilaterally x 5 ea, no HH; min VC's for erect posture throughout Pt ambulated from cancer gym to ortho gym and returned without rest and with rollator; SBA       PATIENT EDUCATION:  Access Code: [email or web address] URL: [email or web address] Date: 07/12/2023 Prepared by: Alvira Monday  Exercises - Supine Bridge  - 1 x daily - 7 x weekly - 1 sets - 10 reps - Seated Long Arc Quad  - 1 x daily - 7 x weekly - 1 sets - 10 reps - Standing Hip Abduction with Counter Support  - 1 x daily - 7 x weekly - 1 sets - 10 reps - Seated March  - 1 x daily - 7 x weekly - 1 sets - 10 reps - Standing March with Counter Support  - 1 x daily - 7 x weekly - 1 sets - 10 reps - Standing Heel Raise  - 1 x daily - 7 x weekly - 1 sets - 10 reps - Seated Hip Adduction Isometrics with Ball  - 1 x daily  - 7 x weekly - 1 sets - 10 reps - 5 hold Education details: Discussed POC and treatment interventions including, strength, balance and function Person educated: Patient Education method: Explanation Education comprehension: verbalized understanding  HOME EXERCISE PROGRAM:  Supine Bridge  - 1 x daily - 7 x weekly - 1 sets - 10 reps - Seated Long Arc Quad  - 1 x daily - 7 x weekly - 1 sets - 10 reps - Standing Hip Abduction with Counter Support  - 1 x daily - 7 x weekly - 1 sets - 10 reps - Seated March  - 1 x daily - 7 x weekly - 1 sets - 10 reps - Standing March with Counter Support  - 1 x daily - 7 x weekly - 1 sets - 10 reps - Standing Heel Raise  - 1 x daily - 7 x weekly - 1 sets - 10 reps - Seated Hip Adduction Isometrics with Ball  - 1 x daily - 7 x weekly - 1 sets - 10 reps - 5 hold  ASSESSMENT:  CLINICAL IMPRESSION: Pt comes in reporting still feeling some fatigued from hospital visit 1 week ago for dizziness and lightheadedness. She reports no clear reason was given for her change in condition. Her BP was taken today and was WNL's  for her at 135/69. She reports still feeling some intermittent dizziness and lightheaded during session so longer seated rest break today. Pt was able to tolerate new exercises of hip flex and abd with SLS on blue oval while performing. She reports feeling challenged by this but overall did well. Was not able to also add hip ext due to fatigue.   OBJECTIVE IMPAIRMENTS: decreased activity tolerance, decreased balance, decreased cognition, decreased endurance, decreased knowledge of condition, difficulty walking, decreased strength, impaired sensation, impaired vision/preception, postural dysfunction, and pain.   ACTIVITY LIMITATIONS: carrying, lifting, standing, squatting, bed mobility, dressing, and locomotion level  PARTICIPATION LIMITATIONS: meal prep, cleaning, laundry, driving, shopping, and community activity  PERSONAL FACTORS: Age and 3+  comorbidities: multiple myeloma,Macular Degeneration , Afib are also affecting patient's functional outcome.   REHAB POTENTIAL: Good  CLINICAL DECISION MAKING: Evolving/moderate complexity  EVALUATION COMPLEXITY: Low   GOALS: Goals reviewed with patient? Yes  SHORT TERM GOALS: Target date: 07/30/2023  Pt will be independent in a HEP for LE/core strength Baseline: Goal status: MET 08/14/2023 2.  Pt will be able to maintain half tandem stance bilaterally x 10 sec Baseline:  Goal status:MET 08/14/2023 3.  Pt will perform 6-7 sit to stands in 30 seconds to decrease fall risk Baseline:  Goal status:MET 10/24, 6 reps with hands 4.  Pt will perform TUG in 26 seconds to decrease fall risk Baseline:   Goal status: MET 10/24   LONG TERM GOALS: Target date: 08/20/2023  Pt will be able to maintain tandem stance for 10 seconds B to demonstrate improved balance Baseline:  Goal status: INITIAL  2.  Pt will be able to perform 9 sit to stands in 30 seconds for average score to decrease fall risk Baseline:  Goal status: INITIAL  3.  Pt will be able to perform TUG with rollator in 24 seconds or less for improved gait speed and decreased fall risk Baseline:  Goal status: INITIAL  4.  Pt will be independent in more advanced HEP Baseline:  Goal status: INITIAL   PLAN:  PT FREQUENCY: 2x/week  PT DURATION: 6 weeks  PLANNED INTERVENTIONS: Therapeutic exercises, Therapeutic activity, Neuromuscular re-education, Balance training, Gait training, Patient/Family education, Self Care, and Manual therapy  PLAN FOR NEXT SESSION:  progress strength, balance, function, gait   Hermenia Bers, PTA 09/11/2023, 10:54 AM

## 2023-09-14 ENCOUNTER — Ambulatory Visit: Payer: Medicare Other

## 2023-09-14 DIAGNOSIS — C9000 Multiple myeloma not having achieved remission: Secondary | ICD-10-CM

## 2023-09-14 DIAGNOSIS — R262 Difficulty in walking, not elsewhere classified: Secondary | ICD-10-CM | POA: Diagnosis not present

## 2023-09-14 DIAGNOSIS — M6281 Muscle weakness (generalized): Secondary | ICD-10-CM

## 2023-09-14 DIAGNOSIS — R2689 Other abnormalities of gait and mobility: Secondary | ICD-10-CM | POA: Diagnosis not present

## 2023-09-14 NOTE — Therapy (Signed)
OUTPATIENT PHYSICAL THERAPY  LOWER EXTREMITY ONCOLOGY TREATMENT  Patient Name: Zoe [name] MRN: [id number] DOB:Mar 09, 1934, 89 y.o., female Today's Date: 09/14/2023  END OF SESSION:  PT End of Session - 09/14/23 1627     Visit Number 13    Number of Visits 25    Date for PT Re-Evaluation 10/26/23    Authorization Type [employer]    PT Start Time 1107    PT Stop Time 1155    PT Time Calculation (min) 48 min    Equipment Utilized During Treatment Gait belt    Activity Tolerance Patient tolerated treatment well;Patient limited by fatigue    Behavior During Therapy WFL for tasks assessed/performed               Past Medical History:  Diagnosis Date   Arthritis    some - per patient   Breast cancer (HCC)    breast cancer / left    Cataract    bilat    GERD (gastroesophageal reflux disease)    History of kidney stones    Hyperlipidemia    Hypertension    Hypothyroidism    Macular degeneration    Left   S/P TAVR (transcatheter aortic valve replacement) 09/03/2018   23 mm Edwards Sapien 3 transcatheter heart valve placed via percutaneous right transfemoral approach    Severe aortic stenosis    Stress incontinence    Thyroid disease    Tinnitus    Past Surgical History:  Procedure Laterality Date   ABDOMINAL HYSTERECTOMY  1970's   BACK SURGERY     BREAST LUMPECTOMY  12/1998   lumpectomy   CARDIAC CATHETERIZATION     CARDIOVERSION N/A 04/18/2023   Procedure: CARDIOVERSION;  Surgeon: Tessa Lerner, DO;  Location: [facility] INVASIVE CV LAB;  Service: Cardiovascular;  Laterality: N/A;   CARDIOVERSION N/A 04/20/2023   Procedure: CARDIOVERSION;  Surgeon: Yates Decamp, MD;  Location: [facility] INVASIVE CV LAB;  Service: Cardiovascular;  Laterality: N/A;   EYE SURGERY     cataract surgery bilat    INTRAOPERATIVE TRANSTHORACIC ECHOCARDIOGRAM N/A 09/03/2018   Procedure: INTRAOPERATIVE TRANSTHORACIC ECHOCARDIOGRAM;  Surgeon: Kathleene Hazel, MD;  Location: [facility] OR;  Service: Open Heart  Surgery;  Laterality: N/A;   KYPHOPLASTY N/A 09/07/2022   Procedure: THORACIC EIGHT KYPHOPLASTY;  Surgeon: Venita Lick, MD;  Location: [facility] OR;  Service: Orthopedics;  Laterality: N/A;  1 hr Local with [facility] C-Bed   LITHOTRIPSY     Right total knee     2018 Dr. Charlann Boxer   RIGHT/LEFT HEART CATH AND CORONARY ANGIOGRAPHY N/A 08/06/2018   Procedure: RIGHT/LEFT HEART CATH AND CORONARY ANGIOGRAPHY;  Surgeon: Yates Decamp, MD;  Location: [facility] INVASIVE CV LAB;  Service: Cardiovascular;  Laterality: N/A;   THYROIDECTOMY, PARTIAL  1975   TONSILLECTOMY     as a child - patient not sure of exact date   TOTAL KNEE ARTHROPLASTY Left 03/13/2016   Procedure: TOTAL KNEE ARTHROPLASTY;  Surgeon: Durene Romans, MD;  Location: [facility] ORS;  Service: Orthopedics;  Laterality: Left;   TOTAL KNEE ARTHROPLASTY Right 06/18/2017   Procedure: RIGHT TOTAL KNEE ARTHROPLASTY;  Surgeon: Durene Romans, MD;  Location: [facility] ORS;  Service: Orthopedics;  Laterality: Right;   TRANSCATHETER AORTIC VALVE REPLACEMENT, TRANSFEMORAL N/A 09/03/2018   Procedure: TRANSCATHETER AORTIC VALVE REPLACEMENT, TRANSFEMORAL;  Surgeon: Kathleene Hazel, MD;  Location: [facility] OR;  Service: Open Heart Surgery;  Laterality: N/A;   Patient Active Problem List   Diagnosis Date Noted   Hypertensive urgency 07/16/2023  Urinary tract infection due to Klebsiella species 07/16/2023   Heart failure with preserved ejection fraction (HCC) 07/16/2023   Hypokalemia 07/16/2023   Hypocalcemia 07/16/2023   Atypical atrial flutter (HCC) 04/19/2023   Acute on chronic diastolic heart failure (HCC) 04/19/2023   Atrial fibrillation with rapid ventricular response (HCC) 04/18/2023   History of stroke 04/16/2023   Fall at home, initial encounter 04/16/2023   Atrial fibrillation (HCC) 04/16/2023   Paroxysmal atrial fibrillation (HCC) 04/15/2023   Elevated troponin 04/15/2023   Chest pain 04/13/2023   Stroke due to embolism (HCC) 03/20/2023   Chest pain, rule out  acute myocardial infarction 02/17/2023   Chemotherapy-induced neuropathy (HCC) 11/28/2022   Pain due to onychomycosis of toenails of both feet 11/08/2022   Multiple myeloma not having achieved remission (HCC) 07/24/2022   Chronic diastolic CHF (congestive heart failure) (HCC) 05/16/2022   Symptomatic anemia 05/16/2022   Hyponatremia 04/02/2022   Microcytic anemia 04/02/2022   S/P TAVR (transcatheter aortic valve replacement) 09/03/2018   Hypothyroidism    Hypertension    Hyperlipidemia    GERD (gastroesophageal reflux disease)    S/P right TKA 06/18/2017   Class 1 obesity 03/15/2016   S/P left TKA 03/13/2016   History of breast cancer, DCIS, lumpectomy March 2000 12/13/2011    PCP: Dr. Irena Reichmann  REFERRING PROVIDER: Dr. Jeanie Sewer, IV  REFERRING DIAG: Multiple Myeloma  THERAPY DIAG:  Multiple myeloma without remission (HCC)  Muscle weakness (generalized)  Difficulty walking  Balance problem  ONSET DATE: 07/2022  Rationale for Evaluation and Treatment: Rehabilitation  SUBJECTIVE:                                                                                                                                                                                           SUBJECTIVE STATEMENT:  I fell down today at then end of my bed because I got tangled in the electric blanket cord and I fell down on my hands and knees. My knees are a little tender. I feel a little stronger, but I still depend on the walker. In the kitchen I can move around a little, but I hold the cabinets. I am doing better getting up and down from chairs now. I would really like to be able to walk more with the quad cane.   eval I need to get my balance better so I can move without fear of falling. Difficult getting up and down from chairs, Some fatigue with walking short distances;ie car into clinic. Reports some foot numbness if she is cold but feels good with socks on. Her grandson lives with her, but  she has  a home health aid in the day time and the night time for safety PERTINENT HISTORY:  Pt was diagnosed with  IgGMultiple Myeloma in 07/2022 after a bone Marrow biopsy showed 60% plasma cells and anemia. No lytic lesions. She was started on chemotherapy. She had surgery for T 8 compression fracture kyphoplasty on 09/07/2022. She was hospitalized 04/15/2023 for Atrial Fibrillation with cardioversion attempted on 04/18/2023. She had some PT at that time. She has a home health aide that drives her to appts and helps with keeping her house clean And someone at night to help her get to the bathroom  PMH significant for A-fib on Eliquis and amiodarone, hypothyroidism, HTN, breast cancer, multiple myeloma,aortic stenosis status post TAVR, hx of TIA, Macular degeneration, Head aches she thinks related to her eyes PAIN:  PAIN:  Are you having pain? Yes NPRS scale: 6-7/10 Pain location: bilateral knees Pain orientation: Bilateral  PAIN TYPE: aching and stiff Pain description: constant  Aggravating factors: lifting her leg Relieving factors: rest.   PRECAUTIONS: PMH significant of A-fib on Eliquis and amiodarone, hypothyroidism, HTN, Left breast cancer, multiple myeloma,aortic stenosis status post TAVR, hx of TIA, T8 compression fx with kyphoplasty  RED FLAGS: Bowel or bladder incontinence: Yes:   and Compression fracture: Yes: had a kyphoplasty    WEIGHT BEARING RESTRICTIONS: No  FALLS:  Has patient fallen in last 6 months? Yes. Number of falls 1, fell in community garden: no injury , 09/14/2023 Larey Seat in home today  LIVING ENVIRONMENT: Lives with: Lucila Maine lives with her presently Lives in: House/apartment Stairs: No;  Has following equipment at home: Single point cane, Environmental consultant - 4 wheeled, and shower chair  OCCUPATION: Retired  LEISURE: read,visit with friends  PRIOR LEVEL OF FUNCTION: Independent with basic ADLs, Independent with household mobility with device, and Needs assistance with  homemaking  PATIENT GOALS: Be able to walk more securely, not be dependent on a walker/rollator   OBJECTIVE:  COGNITION: Overall cognitive status: deficits  PALPATION:   OBSERVATIONS / OTHER ASSESSMENTS: Ambulates with rollator, flexed posture/increased kyphosis  SENSATION: Light touch: deficits   POSTURE: forward head, rounded shoulders, increased thoracic kyphosis  LOWER EXTREMITY STRENGTH:  MMT Right eval RIGHT 09/14/2023  Hip flexion 4- 4+  Hip extension Able to bridge Can bridge  Hip abduction 4- 4+  Hip adduction 4 -seated 4+  Hip internal rotation 3- 3-  Hip external rotation 3+ 3  Knee flexion 4+ 5  Knee extension 4+ 4+ pain  Ankle dorsiflexion 5 5  Ankle plantarflexion    Ankle inversion    Ankle eversion    Great toe extension     (Blank rows = not tested)  MMT LEFT eval LEFT 09/14/2023  Hip flexion 4 4, knee pain  Hip extension Able to bridge Can bridge  Hip abduction 4- 4  Hip adduction 4- 4+  Hip internal rotation 3+ 4-  Hip external rotation 3 3-  Knee flexion 4+ 5  Knee extension 4 4+ pain  Ankle dorsiflexion 5 5  Ankle plantarflexion    Ankle inversion    Ankle eversion    Great toe extension      (Blank rows = not tested)  LYMPHEDEMA ASSESSMENTS:   SURGERY TYPE/DATE: Left lumpectomy 2000  NUMBER OF LYMPH NODES REMOVED: Thinks so, not sure how many  CHEMOTHERAPY: Yes for Multiple Myeloma, No for Breast Cancer  RADIATION:No for Breast Cancer  HORMONE TREATMENT: Yes for Breast Cancer  INFECTIONS: NO  FUNCTIONAL TESTS:  30 seconds sit to  stand test: EVAL: 5 repetitions with use of Ue's 09/14/2023 ; 5 repetitions with minimal use of Ue's, but some knee pain after fall today  Timed up and go: 30.4 seconds with rollator and use of hands to rise from chair 09/14/2023  2 trials 23 seconds, 20 seconds Four position balance test:  able to maintain DLS feet together x 10 sec, and half tandem with left foot forward x 10 sec. , unable  to hold half tandem with right foot forward for greater than 3 seconds, unable to maintain tandem stance bilaterally for more than a few seconds. Did not test SLS GAIT: Distance walked: Nustep to ramp in front of building Assistive device utilized: Rollator Level of assistance: Modified independence Comments:flexed posture, increased kyphosis    Outcome measure:  TODAY'S TREATMENT:                                                                                                                                          DATE:   09/14/2023 02 99, HR 68 BP 129/68 Sit to stand :30 seconds : 5 reps with some knee pain TUG: 2 trials  23 sec, 20 sec Half tandem stance able to maintain Bilaterally x 15 sec Full tandem done bilaterally; limited to no greater than 3 sec for 2-3 trials each Tested lower extremity strength bilaterally and reviewed goals with pt.  09/11/23: Therapeutic Exercises Nu Step seat 8, UE 9 Lev 3 x 8 min, 588 steps Vital signs taken after NuStep at pts request, see Vital Signs In // bars with 1# added to each ankle for following: Heel raises x 10 with HH, x 10 without HH High march x 10 B with fingertip assist Low march x 10 with fingertips, SBA SLS on blue oval for bil hip flex and abd x 10 each (still with 1# on ankles) Seated rest after // bars and removed ankle weights Sit to stand with purple cushion in chair 2 x 5, SBA to CGA (1 posterior LOB to chair) In // bars for multiple trials of modified tandem stance bilaterally in varying positions for 10 sec at a time with fingertip support throughout, was able to get to practicing full tandem today which pt has not felt confident enough to try before. Pt ambulated from cancer gym to ortho gym and returned without rest and with rollator; SBA   09/04/23: Therapeutic Exercises Nu Step seat 8, UE 9 Lev 3 x 8 min, 605 steps In // bars: Heel raises x 10 with HH, x 10 without HH High march x 10 B with fingertip  assist Low march x 10 no HH, SBA Vector reaches with handhold x 5 each returning therapist demo Sit to stand with purple cushion in chair 2 x 5, CGA In // bars for multiple trials of modified tandem stance bilaterally in varying positions for 10 sec at a time working towards no  HH On mini tramp for following: High march 2 x 5 each with +2 HH, 3 way weight shifting x 30 sec each +2 HH, then 1 trial partial tandem stance each x 30 sec with fingertips Pt ambulated from cancer gym to ortho gym and returned without rest and with rollator; SBA   08/31/23:  SpO2 98%, HR 61 bpm Therapeutic Exercises Nu Step seat 8, UE 9 Lev 3 x 8 min, 546 steps In // bars:Heel raises x 10 with HH, x 10 without HH High march x 10 B with HH Low march x 10 no HH, CGA UE reaches with SBA-CGA x 5 each side encouraging reaching until it becomes challenging Sit to stand with purple cushion in chair 2 x 5, CGA 6 in step ups 2 x 5 bilaterally with HH Foot placement on 6 in step forward 2 x 5 ea with second set no HH and sideways bilaterally x 5 ea, no HH; min VC's for erect posture throughout Pt ambulated from cancer gym to ortho gym and returned without rest and with rollator; SBA       PATIENT EDUCATION:  Access Code: [email or web address] URL: [email or web address] Date: 07/12/2023 Prepared by: Alvira Monday  Exercises - Supine Bridge  - 1 x daily - 7 x weekly - 1 sets - 10 reps - Seated Long Arc Quad  - 1 x daily - 7 x weekly - 1 sets - 10 reps - Standing Hip Abduction with Counter Support  - 1 x daily - 7 x weekly - 1 sets - 10 reps - Seated March  - 1 x daily - 7 x weekly - 1 sets - 10 reps - Standing March with Counter Support  - 1 x daily - 7 x weekly - 1 sets - 10 reps - Standing Heel Raise  - 1 x daily - 7 x weekly - 1 sets - 10 reps - Seated Hip Adduction Isometrics with Ball  - 1 x daily - 7 x weekly - 1 sets - 10 reps - 5 hold Education details: Discussed POC and treatment interventions including,  strength, balance and function Person educated: Patient Education method: Explanation Education comprehension: verbalized understanding  HOME EXERCISE PROGRAM:  Supine Bridge  - 1 x daily - 7 x weekly - 1 sets - 10 reps - Seated Long Arc Quad  - 1 x daily - 7 x weekly - 1 sets - 10 reps - Standing Hip Abduction with Counter Support  - 1 x daily - 7 x weekly - 1 sets - 10 reps - Seated March  - 1 x daily - 7 x weekly - 1 sets - 10 reps - Standing March with Counter Support  - 1 x daily - 7 x weekly - 1 sets - 10 reps - Standing Heel Raise  - 1 x daily - 7 x weekly - 1 sets - 10 reps - Seated Hip Adduction Isometrics with Ball  - 1 x daily - 7 x weekly - 1 sets - 10 reps - 5 hold  ASSESSMENT:  CLINICAL IMPRESSION: Pt demonstrates some good improvement in bilateral LE strength(see chart above), but did have some knee pain today after a fall prior to coming. Her sit to stand test was limited by knee pain today at 5 repetitions. She was able to maintain half tandem stance bilaterally today for 15 seconds, but could only maintain tandem stance bilaterally for several seconds. She improved her TUG score today achieving her LTG for this. She is anxious  to try walking with a cane to see if she can safely navigate her home without the rollator. While her overall balance and strength has improved, she demonstrates deficits that would benefit from continuation of skilled therapy to enable her to be safer in her home and community.   OBJECTIVE IMPAIRMENTS: decreased activity tolerance, decreased balance, decreased cognition, decreased endurance, decreased knowledge of condition, difficulty walking, decreased strength, impaired sensation, impaired vision/preception, postural dysfunction, and pain.   ACTIVITY LIMITATIONS: carrying, lifting, standing, squatting, bed mobility, dressing, and locomotion level  PARTICIPATION LIMITATIONS: meal prep, cleaning, laundry, driving, shopping, and community  activity  PERSONAL FACTORS: Age and 3+ comorbidities: multiple myeloma,Macular Degeneration , Afib are also affecting patient's functional outcome.   REHAB POTENTIAL: Good  CLINICAL DECISION MAKING: Evolving/moderate complexity  EVALUATION COMPLEXITY: Low   GOALS: Goals reviewed with patient? Yes  SHORT TERM GOALS: Target date: 07/30/2023  Pt will be independent in a HEP for LE/core strength Baseline: Goal status: MET 08/14/2023 2.  Pt will be able to maintain half tandem stance bilaterally x 10 sec Baseline:  Goal status:MET 08/14/2023 3.  Pt will perform 6-7 sit to stands in 30 seconds to decrease fall risk Baseline:  Goal status:MET 10/24, 6 reps with hands 4.  Pt will perform TUG in 26 seconds to decrease fall risk Baseline:   Goal status: MET 10/24   LONG TERM GOALS: Target date: 10/25/2022 Pt will be able to maintain tandem stance for 10 seconds B to demonstrate improved balance Baseline:  Goal status: I In progress  2.  Pt will be able to perform 9 sit to stands in 30 seconds for average score to decrease fall risk Baseline:  Goal status: In Progress, Pt had knee pain from fall this am 3.  Pt will be able to perform TUG with rollator in 24 seconds or less for improved gait speed and decreased fall risk Baseline:  Goal status:MET  09/14/2023  4.  Pt will be independent in more advanced HEP Baseline:  Goal status: In Progress  5. Pt will be able to safely ambulate household distances with quad cane  GOAL STATUS; NEW  PLAN:  PT FREQUENCY: 2x/week  PT DURATION: 6 weeks(pt is not able to come week of Nov. 25, 2024  PLANNED INTERVENTIONS: Therapeutic exercises, Therapeutic activity, Neuromuscular re-education, Balance training, Gait training, Patient/Family education, Self Care, and Manual therapy  PLAN FOR NEXT SESSION:  progress strength, balance, function, gait with quad cane at pt request.   Waynette Buttery, PT 09/14/2023, 4:50 PM

## 2023-09-19 ENCOUNTER — Other Ambulatory Visit: Payer: Self-pay | Admitting: Cardiology

## 2023-09-21 ENCOUNTER — Inpatient Hospital Stay: Payer: Medicare Other

## 2023-09-21 ENCOUNTER — Other Ambulatory Visit: Payer: Self-pay | Admitting: Hematology and Oncology

## 2023-09-21 DIAGNOSIS — E538 Deficiency of other specified B group vitamins: Secondary | ICD-10-CM | POA: Diagnosis not present

## 2023-09-21 DIAGNOSIS — D649 Anemia, unspecified: Secondary | ICD-10-CM | POA: Diagnosis not present

## 2023-09-21 DIAGNOSIS — I959 Hypotension, unspecified: Secondary | ICD-10-CM | POA: Diagnosis not present

## 2023-09-21 DIAGNOSIS — D6959 Other secondary thrombocytopenia: Secondary | ICD-10-CM | POA: Diagnosis not present

## 2023-09-21 DIAGNOSIS — R251 Tremor, unspecified: Secondary | ICD-10-CM | POA: Diagnosis not present

## 2023-09-21 DIAGNOSIS — C9000 Multiple myeloma not having achieved remission: Secondary | ICD-10-CM

## 2023-09-21 DIAGNOSIS — I4891 Unspecified atrial fibrillation: Secondary | ICD-10-CM | POA: Diagnosis not present

## 2023-09-21 DIAGNOSIS — Z7901 Long term (current) use of anticoagulants: Secondary | ICD-10-CM | POA: Diagnosis not present

## 2023-09-21 DIAGNOSIS — G629 Polyneuropathy, unspecified: Secondary | ICD-10-CM | POA: Diagnosis not present

## 2023-09-21 DIAGNOSIS — R600 Localized edema: Secondary | ICD-10-CM | POA: Diagnosis not present

## 2023-09-21 LAB — CBC WITH DIFFERENTIAL (CANCER CENTER ONLY)
Abs Immature Granulocytes: 0.03 10*3/uL (ref 0.00–0.07)
Basophils Absolute: 0 10*3/uL (ref 0.0–0.1)
Basophils Relative: 0 %
Eosinophils Absolute: 0.1 10*3/uL (ref 0.0–0.5)
Eosinophils Relative: 2 %
HCT: 37.5 % (ref 36.0–46.0)
Hemoglobin: 12.6 g/dL (ref 12.0–15.0)
Immature Granulocytes: 1 %
Lymphocytes Relative: 10 %
Lymphs Abs: 0.6 10*3/uL — ABNORMAL LOW (ref 0.7–4.0)
MCH: 31.7 pg (ref 26.0–34.0)
MCHC: 33.6 g/dL (ref 30.0–36.0)
MCV: 94.5 fL (ref 80.0–100.0)
Monocytes Absolute: 1.2 10*3/uL — ABNORMAL HIGH (ref 0.1–1.0)
Monocytes Relative: 20 %
Neutro Abs: 3.9 10*3/uL (ref 1.7–7.7)
Neutrophils Relative %: 67 %
Platelet Count: 90 10*3/uL — ABNORMAL LOW (ref 150–400)
RBC: 3.97 MIL/uL (ref 3.87–5.11)
RDW: 17.1 % — ABNORMAL HIGH (ref 11.5–15.5)
WBC Count: 5.7 10*3/uL (ref 4.0–10.5)
nRBC: 0 % (ref 0.0–0.2)

## 2023-09-21 LAB — CMP (CANCER CENTER ONLY)
ALT: 9 U/L (ref 0–44)
AST: 11 U/L — ABNORMAL LOW (ref 15–41)
Albumin: 3.6 g/dL (ref 3.5–5.0)
Alkaline Phosphatase: 74 U/L (ref 38–126)
Anion gap: 8 (ref 5–15)
BUN: 25 mg/dL — ABNORMAL HIGH (ref 8–23)
CO2: 27 mmol/L (ref 22–32)
Calcium: 8.4 mg/dL — ABNORMAL LOW (ref 8.9–10.3)
Chloride: 102 mmol/L (ref 98–111)
Creatinine: 0.94 mg/dL (ref 0.44–1.00)
GFR, Estimated: 59 mL/min — ABNORMAL LOW (ref >60)
Glucose, Bld: 108 mg/dL — ABNORMAL HIGH (ref 70–99)
Potassium: 4.1 mmol/L (ref 3.5–5.1)
Sodium: 137 mmol/L (ref 135–145)
Total Bilirubin: 0.6 mg/dL (ref <1.2)
Total Protein: 5.4 g/dL — ABNORMAL LOW (ref 6.5–8.1)

## 2023-09-21 LAB — LACTATE DEHYDROGENASE: LDH: 189 U/L (ref 98–192)

## 2023-09-22 LAB — KAPPA/LAMBDA LIGHT CHAINS
Kappa free light chain: 10.2 mg/L (ref 3.3–19.4)
Kappa, lambda light chain ratio: 1.17 (ref 0.26–1.65)
Lambda free light chains: 8.7 mg/L (ref 5.7–26.3)

## 2023-09-24 ENCOUNTER — Other Ambulatory Visit: Payer: Self-pay | Admitting: *Deleted

## 2023-09-24 MED ORDER — LENALIDOMIDE 10 MG PO CAPS: 10.0000 mg | ORAL_CAPSULE | Freq: Every day | ORAL | 0 refills | Status: DC

## 2023-09-25 ENCOUNTER — Telehealth: Payer: Self-pay

## 2023-09-25 ENCOUNTER — Ambulatory Visit: Payer: Medicare Other

## 2023-09-25 LAB — MULTIPLE MYELOMA PANEL, SERUM
Albumin SerPl Elph-Mcnc: 3.3 g/dL (ref 2.9–4.4)
Albumin/Glob SerPl: 1.9 — ABNORMAL HIGH (ref 0.7–1.7)
Alpha 1: 0.2 g/dL (ref 0.0–0.4)
Alpha2 Glob SerPl Elph-Mcnc: 0.7 g/dL (ref 0.4–1.0)
B-Globulin SerPl Elph-Mcnc: 0.7 g/dL (ref 0.7–1.3)
Gamma Glob SerPl Elph-Mcnc: 0.2 g/dL — ABNORMAL LOW (ref 0.4–1.8)
Globulin, Total: 1.8 g/dL — ABNORMAL LOW (ref 2.2–3.9)
IgA: 38 mg/dL — ABNORMAL LOW (ref 64–422)
IgG (Immunoglobin G), Serum: 339 mg/dL — ABNORMAL LOW (ref 586–1602)
IgM (Immunoglobulin M), Srm: 20 mg/dL — ABNORMAL LOW (ref 26–217)
M Protein SerPl Elph-Mcnc: 0.1 g/dL — ABNORMAL HIGH
Total Protein ELP: 5.1 g/dL — ABNORMAL LOW (ref 6.0–8.5)

## 2023-09-25 NOTE — Telephone Encounter (Signed)
Transition Care Management Unsuccessful Follow-up Telephone Call  Date of discharge and from where:  09/05/2023 [facility]  Attempts:  1st Attempt  Reason for unsuccessful TCM follow-up call:  Unable to leave message voicemail not setup.  Danetta Prom [facility]  [facility], [facility] Guide Direct Dial: [phone]  Website: [email or web address]

## 2023-09-26 ENCOUNTER — Telehealth: Payer: Self-pay

## 2023-09-26 DIAGNOSIS — M79644 Pain in right finger(s): Secondary | ICD-10-CM | POA: Diagnosis not present

## 2023-09-26 DIAGNOSIS — M659 Unspecified synovitis and tenosynovitis, unspecified site: Secondary | ICD-10-CM | POA: Diagnosis not present

## 2023-09-26 NOTE — Telephone Encounter (Signed)
Transition Care Management Unsuccessful Follow-up Telephone Call  Date of discharge and from where:  09/05/2023 [facility]  Attempts:  2nd Attempt  Reason for unsuccessful TCM follow-up call:  Left voice message  Ethyl Vila [facility]  [facility], [facility] Resource Care Guide Direct Dial: [phone]  Website: [email or web address]

## 2023-09-27 ENCOUNTER — Ambulatory Visit: Payer: Medicare Other | Attending: Hematology and Oncology

## 2023-09-27 ENCOUNTER — Ambulatory Visit: Payer: Medicare Other | Admitting: Cardiology

## 2023-09-27 DIAGNOSIS — C9000 Multiple myeloma not having achieved remission: Secondary | ICD-10-CM | POA: Diagnosis not present

## 2023-09-27 DIAGNOSIS — R262 Difficulty in walking, not elsewhere classified: Secondary | ICD-10-CM | POA: Insufficient documentation

## 2023-09-27 DIAGNOSIS — M6281 Muscle weakness (generalized): Secondary | ICD-10-CM | POA: Diagnosis not present

## 2023-09-27 DIAGNOSIS — R2689 Other abnormalities of gait and mobility: Secondary | ICD-10-CM | POA: Diagnosis not present

## 2023-09-27 NOTE — Therapy (Signed)
OUTPATIENT PHYSICAL THERAPY  LOWER EXTREMITY ONCOLOGY TREATMENT  Patient Name: Zoe [name] MRN: [id number] DOB:03/13/1934, 89 y.o., female Today's Date: 09/27/2023  END OF SESSION:  PT End of Session - 09/27/23 1055     Visit Number 14    Number of Visits 25    Date for PT Re-Evaluation 10/26/23    Authorization Type [employer]    Authorization Time Period 09/19/2023-10/17/23    Authorization - Visit Number 1    Authorization - Number of Visits 4    PT Start Time 1100    PT Stop Time 1150    PT Time Calculation (min) 50 min    Equipment Utilized During Treatment Gait belt    Activity Tolerance Patient tolerated treatment well;Patient limited by fatigue    Behavior During Therapy WFL for tasks assessed/performed               Past Medical History:  Diagnosis Date   Arthritis    some - per patient   Breast cancer (HCC)    breast cancer / left    Cataract    bilat    GERD (gastroesophageal reflux disease)    History of kidney stones    Hyperlipidemia    Hypertension    Hypothyroidism    Macular degeneration    Left   S/P TAVR (transcatheter aortic valve replacement) 09/03/2018   23 mm Edwards Sapien 3 transcatheter heart valve placed via percutaneous right transfemoral approach    Severe aortic stenosis    Stress incontinence    Thyroid disease    Tinnitus    Past Surgical History:  Procedure Laterality Date   ABDOMINAL HYSTERECTOMY  1970's   BACK SURGERY     BREAST LUMPECTOMY  12/1998   lumpectomy   CARDIAC CATHETERIZATION     CARDIOVERSION N/A 04/18/2023   Procedure: CARDIOVERSION;  Surgeon: Tessa Lerner, DO;  Location: [facility] INVASIVE CV LAB;  Service: Cardiovascular;  Laterality: N/A;   CARDIOVERSION N/A 04/20/2023   Procedure: CARDIOVERSION;  Surgeon: Yates Decamp, MD;  Location: [facility] INVASIVE CV LAB;  Service: Cardiovascular;  Laterality: N/A;   EYE SURGERY     cataract surgery bilat    INTRAOPERATIVE TRANSTHORACIC ECHOCARDIOGRAM N/A 09/03/2018   Procedure:  INTRAOPERATIVE TRANSTHORACIC ECHOCARDIOGRAM;  Surgeon: Kathleene Hazel, MD;  Location: [facility] OR;  Service: Open Heart Surgery;  Laterality: N/A;   KYPHOPLASTY N/A 09/07/2022   Procedure: THORACIC EIGHT KYPHOPLASTY;  Surgeon: Venita Lick, MD;  Location: [facility] OR;  Service: Orthopedics;  Laterality: N/A;  1 hr Local with [facility] 3 C-Bed   LITHOTRIPSY     Right total knee     2018 Dr. Charlann Boxer   RIGHT/LEFT HEART CATH AND CORONARY ANGIOGRAPHY N/A 08/06/2018   Procedure: RIGHT/LEFT HEART CATH AND CORONARY ANGIOGRAPHY;  Surgeon: Yates Decamp, MD;  Location: [facility] INVASIVE CV LAB;  Service: Cardiovascular;  Laterality: N/A;   THYROIDECTOMY, PARTIAL  1975   TONSILLECTOMY     as a child - patient not sure of exact date   TOTAL KNEE ARTHROPLASTY Left 03/13/2016   Procedure: TOTAL KNEE ARTHROPLASTY;  Surgeon: Durene Romans, MD;  Location: [facility] ORS;  Service: Orthopedics;  Laterality: Left;   TOTAL KNEE ARTHROPLASTY Right 06/18/2017   Procedure: RIGHT TOTAL KNEE ARTHROPLASTY;  Surgeon: Durene Romans, MD;  Location: [facility] ORS;  Service: Orthopedics;  Laterality: Right;   TRANSCATHETER AORTIC VALVE REPLACEMENT, TRANSFEMORAL N/A 09/03/2018   Procedure: TRANSCATHETER AORTIC VALVE REPLACEMENT, TRANSFEMORAL;  Surgeon: Kathleene Hazel, MD;  Location: [facility] OR;  Service: Open Heart Surgery;  Laterality: N/A;   Patient Active Problem List   Diagnosis Date Noted   Hypertensive urgency 07/16/2023   Urinary tract infection due to Klebsiella species 07/16/2023   Heart failure with preserved ejection fraction (HCC) 07/16/2023   Hypokalemia 07/16/2023   Hypocalcemia 07/16/2023   Atypical atrial flutter (HCC) 04/19/2023   Acute on chronic diastolic heart failure (HCC) 04/19/2023   Atrial fibrillation with rapid ventricular response (HCC) 04/18/2023   History of stroke 04/16/2023   Fall at home, initial encounter 04/16/2023   Atrial fibrillation (HCC) 04/16/2023   Paroxysmal atrial fibrillation (HCC) 04/15/2023    Elevated troponin 04/15/2023   Chest pain 04/13/2023   Stroke due to embolism (HCC) 03/20/2023   Chest pain, rule out acute myocardial infarction 02/17/2023   Chemotherapy-induced neuropathy (HCC) 11/28/2022   Pain due to onychomycosis of toenails of both feet 11/08/2022   Multiple myeloma not having achieved remission (HCC) 07/24/2022   Chronic diastolic CHF (congestive heart failure) (HCC) 05/16/2022   Symptomatic anemia 05/16/2022   Hyponatremia 04/02/2022   Microcytic anemia 04/02/2022   S/P TAVR (transcatheter aortic valve replacement) 09/03/2018   Hypothyroidism    Hypertension    Hyperlipidemia    GERD (gastroesophageal reflux disease)    S/P right TKA 06/18/2017   Class 1 obesity 03/15/2016   S/P left TKA 03/13/2016   History of breast cancer, DCIS, lumpectomy March 2000 12/13/2011    PCP: Dr. Irena Reichmann  REFERRING PROVIDER: Dr. Jeanie Sewer, IV  REFERRING DIAG: Multiple Myeloma  THERAPY DIAG:  Multiple myeloma without remission (HCC)  Muscle weakness (generalized)  Difficulty walking  Balance problem  ONSET DATE: 07/2022  Rationale for Evaluation and Treatment: Rehabilitation  SUBJECTIVE:                                                                                                                                                                                           SUBJECTIVE STATEMENT:   My right knee is still bruised from the fall, and my back still hurts a little. My right middle finger is very swollen and they think I may be getting RA. I really want to be able to walk with a cane. (Pt forgot her quad cane)   eval I need to get my balance better so I can move without fear of falling. Difficult getting up and down from chairs, Some fatigue with walking short distances;ie car into clinic. Reports some foot numbness if she is cold but feels good with socks on. Her grandson lives with her, but she has a home health aid in the day time and the night  time  for safety PERTINENT HISTORY:  Pt was diagnosed with  IgGMultiple Myeloma in 07/2022 after a bone Marrow biopsy showed 60% plasma cells and anemia. No lytic lesions. She was started on chemotherapy. She had surgery for T 8 compression fracture kyphoplasty on 09/07/2022. She was hospitalized 04/15/2023 for Atrial Fibrillation with cardioversion attempted on 04/18/2023. She had some PT at that time. She has a home health aide that drives her to appts and helps with keeping her house clean And someone at night to help her get to the bathroom  PMH significant for A-fib on Eliquis and amiodarone, hypothyroidism, HTN, breast cancer, multiple myeloma,aortic stenosis status post TAVR, hx of TIA, Macular degeneration, Head aches she thinks related to her eyes PAIN:  PAIN:  Are you having pain? Yes NPRS scale: 5-6/10 Pain location: bilateral knees Pain orientation: Bilateral  PAIN TYPE: aching and stiff Pain description: Intermittent Aggravating factors: lifting her leg Relieving factors: rest.   PRECAUTIONS: PMH significant of A-fib on Eliquis and amiodarone, hypothyroidism, HTN, Left breast cancer, multiple myeloma,aortic stenosis status post TAVR, hx of TIA, T8 compression fx with kyphoplasty  RED FLAGS: Bowel or bladder incontinence: Yes:   and Compression fracture: Yes: had a kyphoplasty    WEIGHT BEARING RESTRICTIONS: No  FALLS:  Has patient fallen in last 6 months? Yes. Number of falls 1, fell in community garden: no injury , 09/14/2023 Larey Seat in home today  LIVING ENVIRONMENT: Lives with: Lucila Maine lives with her presently Lives in: House/apartment Stairs: No;  Has following equipment at home: Single point cane, Environmental consultant - 4 wheeled, and shower chair  OCCUPATION: Retired  LEISURE: read,visit with friends  PRIOR LEVEL OF FUNCTION: Independent with basic ADLs, Independent with household mobility with device, and Needs assistance with homemaking  PATIENT GOALS: Be able to walk more  securely, not be dependent on a walker/rollator   OBJECTIVE:  COGNITION: Overall cognitive status: deficits  PALPATION:   OBSERVATIONS / OTHER ASSESSMENTS: Ambulates with rollator, flexed posture/increased kyphosis  SENSATION: Light touch: deficits   POSTURE: forward head, rounded shoulders, increased thoracic kyphosis  LOWER EXTREMITY STRENGTH:  MMT Right eval RIGHT 09/14/2023  Hip flexion 4- 4+  Hip extension Able to bridge Can bridge  Hip abduction 4- 4+  Hip adduction 4 -seated 4+  Hip internal rotation 3- 3-  Hip external rotation 3+ 3  Knee flexion 4+ 5  Knee extension 4+ 4+ pain  Ankle dorsiflexion 5 5  Ankle plantarflexion    Ankle inversion    Ankle eversion    Great toe extension     (Blank rows = not tested)  MMT LEFT eval LEFT 09/14/2023  Hip flexion 4 4, knee pain  Hip extension Able to bridge Can bridge  Hip abduction 4- 4  Hip adduction 4- 4+  Hip internal rotation 3+ 4-  Hip external rotation 3 3-  Knee flexion 4+ 5  Knee extension 4 4+ pain  Ankle dorsiflexion 5 5  Ankle plantarflexion    Ankle inversion    Ankle eversion    Great toe extension      (Blank rows = not tested)  LYMPHEDEMA ASSESSMENTS:   SURGERY TYPE/DATE: Left lumpectomy 2000  NUMBER OF LYMPH NODES REMOVED: Thinks so, not sure how many  CHEMOTHERAPY: Yes for Multiple Myeloma, No for Breast Cancer  RADIATION:No for Breast Cancer  HORMONE TREATMENT: Yes for Breast Cancer  INFECTIONS: NO  FUNCTIONAL TESTS:  30 seconds sit to stand test: EVAL: 5 repetitions with use of Ue's 09/14/2023 ; 5 repetitions  with minimal use of Ue's, but some knee pain after fall today  Timed up and go: 30.4 seconds with rollator and use of hands to rise from chair 09/14/2023  2 trials 23 seconds, 20 seconds Four position balance test:  able to maintain DLS feet together x 10 sec, and half tandem with left foot forward x 10 sec. , unable to hold half tandem with right foot forward for  greater than 3 seconds, unable to maintain tandem stance bilaterally for more than a few seconds. Did not test SLS GAIT: Distance walked: Nustep to ramp in front of building Assistive device utilized: Rollator Level of assistance: Modified independence Comments:flexed posture, increased kyphosis    Outcome measure:  TODAY'S TREATMENT:                                                                                                                                          DATE:   09/27/2023  Nu Step seat 8, UE 9 Lev 3 x 8 min to warm up and loosen knees and muscles In parallel bars using left hand only practice ambulating 6 lengths with single arm support Instructed in proper cane placement and gait pattern with step through gait using SPC;pt wearing gait belt and with CGA of PT; Practiced in parallel bars x 8 lengths and then as pt improved went to gym floor.  Seated rest Practiced on straight aways x 3 and then walked half way to ortho gym with CGA of PT and emphasis on erect posture and proper step through. Seated rest Repeated walking around gym and straight aways. Pt noted to limp slightly on left LE as she increased her distance. She was not aware of this. Also appeared to be hip abd weakness. Fully explained to pt my concerns about her walking with a cane;intermittent dizziness and fall risk. Advised her to contact pharmacist to see if any of her meds could be causing dizziness. Ishe tells me she has used the rollator atleast a year.  09/14/2023 02 99, HR 68 BP 129/68 Sit to stand :30 seconds : 5 reps with some knee pain TUG: 2 trials  23 sec, 20 sec Half tandem stance able to maintain Bilaterally x 15 sec Full tandem done bilaterally; limited to no greater than 3 sec for 2-3 trials each Tested lower extremity strength bilaterally and reviewed goals with pt.  09/11/23: Therapeutic Exercises Nu Step seat 8, UE 9 Lev 3 x 8 min, 588 steps Vital signs taken after NuStep at pts  request, see Vital Signs In // bars with 1# added to each ankle for following: Heel raises x 10 with HH, x 10 without HH High march x 10 B with fingertip assist Low march x 10 with fingertips, SBA SLS on blue oval for bil hip flex and abd x 10 each (still with 1# on ankles) Seated rest after // bars and removed ankle  weights Sit to stand with purple cushion in chair 2 x 5, SBA to CGA (1 posterior LOB to chair) In // bars for multiple trials of modified tandem stance bilaterally in varying positions for 10 sec at a time with fingertip support throughout, was able to get to practicing full tandem today which pt has not felt confident enough to try before. Pt ambulated from cancer gym to ortho gym and returned without rest and with rollator; SBA   09/04/23: Therapeutic Exercises Nu Step seat 8, UE 9 Lev 3 x 8 min, 605 steps In // bars: Heel raises x 10 with HH, x 10 without HH High march x 10 B with fingertip assist Low march x 10 no HH, SBA Vector reaches with handhold x 5 each returning therapist demo Sit to stand with purple cushion in chair 2 x 5, CGA In // bars for multiple trials of modified tandem stance bilaterally in varying positions for 10 sec at a time working towards no HH On mini tramp for following: High march 2 x 5 each with +2 HH, 3 way weight shifting x 30 sec each +2 HH, then 1 trial partial tandem stance each x 30 sec with fingertips Pt ambulated from cancer gym to ortho gym and returned without rest and with rollator; SBA   08/31/23:  SpO2 98%, HR 61 bpm Therapeutic Exercises Nu Step seat 8, UE 9 Lev 3 x 8 min, 546 steps In // bars:Heel raises x 10 with HH, x 10 without HH High march x 10 B with HH Low march x 10 no HH, CGA UE reaches with SBA-CGA x 5 each side encouraging reaching until it becomes challenging Sit to stand with purple cushion in chair 2 x 5, CGA 6 in step ups 2 x 5 bilaterally with HH Foot placement on 6 in step forward 2 x 5 ea with second set no  HH and sideways bilaterally x 5 ea, no HH; min VC's for erect posture throughout Pt ambulated from cancer gym to ortho gym and returned without rest and with rollator; SBA       PATIENT EDUCATION:  Access Code: [email or web address] URL: [email or web address] Date: 07/12/2023 Prepared by: Alvira Monday  Exercises - Supine Bridge  - 1 x daily - 7 x weekly - 1 sets - 10 reps - Seated Long Arc Quad  - 1 x daily - 7 x weekly - 1 sets - 10 reps - Standing Hip Abduction with Counter Support  - 1 x daily - 7 x weekly - 1 sets - 10 reps - Seated March  - 1 x daily - 7 x weekly - 1 sets - 10 reps - Standing March with Counter Support  - 1 x daily - 7 x weekly - 1 sets - 10 reps - Standing Heel Raise  - 1 x daily - 7 x weekly - 1 sets - 10 reps - Seated Hip Adduction Isometrics with Ball  - 1 x daily - 7 x weekly - 1 sets - 10 reps - 5 hold Education details: Discussed POC and treatment interventions including, strength, balance and function Person educated: Patient Education method: Explanation Education comprehension: verbalized understanding  HOME EXERCISE PROGRAM:  Supine Bridge  - 1 x daily - 7 x weekly - 1 sets - 10 reps - Seated Long Arc Quad  - 1 x daily - 7 x weekly - 1 sets - 10 reps - Standing Hip Abduction with Counter Support  - 1 x daily - 7 x  weekly - 1 sets - 10 reps - Seated March  - 1 x daily - 7 x weekly - 1 sets - 10 reps - Standing March with Counter Support  - 1 x daily - 7 x weekly - 1 sets - 10 reps - Standing Heel Raise  - 1 x daily - 7 x weekly - 1 sets - 10 reps - Seated Hip Adduction Isometrics with Ball  - 1 x daily - 7 x weekly - 1 sets - 10 reps - 5 hold  ASSESSMENT:  CLINICAL IMPRESSION:  Pt practiced gait in parallel bars with hand single HH on bar and later with [facility] before bringing her onto the gym floor.  By focusing on walking and not cane placement as much she did very well coordinating her cane and gait. She did experience several small LOB today  mostly self controlled but assisted mildly by PT . Pt to bring her quad cane next visit. Again emphasized the importance of safety for her.   OBJECTIVE IMPAIRMENTS: decreased activity tolerance, decreased balance, decreased cognition, decreased endurance, decreased knowledge of condition, difficulty walking, decreased strength, impaired sensation, impaired vision/preception, postural dysfunction, and pain.   ACTIVITY LIMITATIONS: carrying, lifting, standing, squatting, bed mobility, dressing, and locomotion level  PARTICIPATION LIMITATIONS: meal prep, cleaning, laundry, driving, shopping, and community activity  PERSONAL FACTORS: Age and 3+ comorbidities: multiple myeloma,Macular Degeneration , Afib are also affecting patient's functional outcome.   REHAB POTENTIAL: Good  CLINICAL DECISION MAKING: Evolving/moderate complexity  EVALUATION COMPLEXITY: Low   GOALS: Goals reviewed with patient? Yes  SHORT TERM GOALS: Target date: 07/30/2023  Pt will be independent in a HEP for LE/core strength Baseline: Goal status: MET 08/14/2023 2.  Pt will be able to maintain half tandem stance bilaterally x 10 sec Baseline:  Goal status:MET 08/14/2023 3.  Pt will perform 6-7 sit to stands in 30 seconds to decrease fall risk Baseline:  Goal status:MET 10/24, 6 reps with hands 4.  Pt will perform TUG in 26 seconds to decrease fall risk Baseline:   Goal status: MET 10/24   LONG TERM GOALS: Target date: 10/25/2022 Pt will be able to maintain tandem stance for 10 seconds B to demonstrate improved balance Baseline:  Goal status: I In progress  2.  Pt will be able to perform 9 sit to stands in 30 seconds for average score to decrease fall risk Baseline:  Goal status: In Progress, Pt had knee pain from fall this am 3.  Pt will be able to perform TUG with rollator in 24 seconds or less for improved gait speed and decreased fall risk Baseline:  Goal status:MET  09/14/2023  4.  Pt will be  independent in more advanced HEP Baseline:  Goal status: In Progress  5. Pt will be able to safely ambulate household distances with quad cane  GOAL STATUS; NEW  PLAN:  PT FREQUENCY: 2x/week  PT DURATION: 6 weeks(pt is not able to come week of Nov. 25, 2024  PLANNED INTERVENTIONS: Therapeutic exercises, Therapeutic activity, Neuromuscular re-education, Balance training, Gait training, Patient/Family education, Self Care, and Manual therapy  PLAN FOR NEXT SESSION: Gait, progress strength, balance, function, gait with quad cane at pt request.   Waynette Buttery, PT 09/27/2023, 11:57 AM

## 2023-10-01 ENCOUNTER — Emergency Department (HOSPITAL_COMMUNITY): Payer: Medicare Other

## 2023-10-01 ENCOUNTER — Other Ambulatory Visit: Payer: Self-pay

## 2023-10-01 ENCOUNTER — Observation Stay (HOSPITAL_COMMUNITY)
Admission: EM | Admit: 2023-10-01 | Discharge: 2023-10-02 | Disposition: A | Discharge status: Home / Self Care | Payer: Medicare Other | Attending: Internal Medicine | Admitting: Internal Medicine

## 2023-10-01 ENCOUNTER — Encounter (HOSPITAL_COMMUNITY): Payer: Self-pay

## 2023-10-01 DIAGNOSIS — Z79899 Other long term (current) drug therapy: Secondary | ICD-10-CM | POA: Diagnosis not present

## 2023-10-01 DIAGNOSIS — I10 Essential (primary) hypertension: Secondary | ICD-10-CM | POA: Diagnosis present

## 2023-10-01 DIAGNOSIS — I214 Non-ST elevation (NSTEMI) myocardial infarction: Secondary | ICD-10-CM | POA: Diagnosis not present

## 2023-10-01 DIAGNOSIS — I5032 Chronic diastolic (congestive) heart failure: Secondary | ICD-10-CM | POA: Diagnosis not present

## 2023-10-01 DIAGNOSIS — Z853 Personal history of malignant neoplasm of breast: Secondary | ICD-10-CM | POA: Insufficient documentation

## 2023-10-01 DIAGNOSIS — I499 Cardiac arrhythmia, unspecified: Secondary | ICD-10-CM | POA: Diagnosis not present

## 2023-10-01 DIAGNOSIS — E876 Hypokalemia: Secondary | ICD-10-CM | POA: Diagnosis present

## 2023-10-01 DIAGNOSIS — Z8579 Personal history of other malignant neoplasms of lymphoid, hematopoietic and related tissues: Secondary | ICD-10-CM | POA: Diagnosis not present

## 2023-10-01 DIAGNOSIS — E039 Hypothyroidism, unspecified: Secondary | ICD-10-CM | POA: Insufficient documentation

## 2023-10-01 DIAGNOSIS — R072 Precordial pain: Principal | ICD-10-CM | POA: Insufficient documentation

## 2023-10-01 DIAGNOSIS — I700 Atherosclerosis of aorta: Secondary | ICD-10-CM | POA: Diagnosis not present

## 2023-10-01 DIAGNOSIS — I480 Paroxysmal atrial fibrillation: Secondary | ICD-10-CM | POA: Insufficient documentation

## 2023-10-01 DIAGNOSIS — R404 Transient alteration of awareness: Secondary | ICD-10-CM | POA: Diagnosis not present

## 2023-10-01 DIAGNOSIS — Z952 Presence of prosthetic heart valve: Secondary | ICD-10-CM | POA: Diagnosis not present

## 2023-10-01 DIAGNOSIS — R0689 Other abnormalities of breathing: Secondary | ICD-10-CM | POA: Diagnosis not present

## 2023-10-01 DIAGNOSIS — I517 Cardiomegaly: Secondary | ICD-10-CM | POA: Diagnosis not present

## 2023-10-01 DIAGNOSIS — I4892 Unspecified atrial flutter: Secondary | ICD-10-CM | POA: Diagnosis present

## 2023-10-01 DIAGNOSIS — N1831 Chronic kidney disease, stage 3a: Secondary | ICD-10-CM | POA: Insufficient documentation

## 2023-10-01 DIAGNOSIS — Z96653 Presence of artificial knee joint, bilateral: Secondary | ICD-10-CM | POA: Insufficient documentation

## 2023-10-01 DIAGNOSIS — I130 Hypertensive heart and chronic kidney disease with heart failure and stage 1 through stage 4 chronic kidney disease, or unspecified chronic kidney disease: Secondary | ICD-10-CM | POA: Insufficient documentation

## 2023-10-01 DIAGNOSIS — Z8679 Personal history of other diseases of the circulatory system: Secondary | ICD-10-CM | POA: Diagnosis not present

## 2023-10-01 DIAGNOSIS — I201 Angina pectoris with documented spasm: Secondary | ICD-10-CM

## 2023-10-01 DIAGNOSIS — Z7901 Long term (current) use of anticoagulants: Secondary | ICD-10-CM

## 2023-10-01 DIAGNOSIS — I4891 Unspecified atrial fibrillation: Secondary | ICD-10-CM | POA: Diagnosis not present

## 2023-10-01 DIAGNOSIS — C9000 Multiple myeloma not having achieved remission: Secondary | ICD-10-CM | POA: Insufficient documentation

## 2023-10-01 DIAGNOSIS — Z8673 Personal history of transient ischemic attack (TIA), and cerebral infarction without residual deficits: Secondary | ICD-10-CM | POA: Diagnosis not present

## 2023-10-01 DIAGNOSIS — R079 Chest pain, unspecified: Secondary | ICD-10-CM | POA: Diagnosis not present

## 2023-10-01 DIAGNOSIS — Z743 Need for continuous supervision: Secondary | ICD-10-CM | POA: Diagnosis not present

## 2023-10-01 LAB — TROPONIN I (HIGH SENSITIVITY)
Troponin I (High Sensitivity): 47 ng/L — ABNORMAL HIGH (ref <18)
Troponin I (High Sensitivity): 67 ng/L — ABNORMAL HIGH (ref <18)
Troponin I (High Sensitivity): 85 ng/L — ABNORMAL HIGH (ref <18)

## 2023-10-01 LAB — BASIC METABOLIC PANEL
Anion gap: 11 (ref 5–15)
BUN: 33 mg/dL — ABNORMAL HIGH (ref 8–23)
CO2: 24 mmol/L (ref 22–32)
Calcium: 8.6 mg/dL — ABNORMAL LOW (ref 8.9–10.3)
Chloride: 100 mmol/L (ref 98–111)
Creatinine, Ser: 1.06 mg/dL — ABNORMAL HIGH (ref 0.44–1.00)
GFR, Estimated: 51 mL/min — ABNORMAL LOW (ref >60)
Glucose, Bld: 92 mg/dL (ref 70–99)
Potassium: 3.2 mmol/L — ABNORMAL LOW (ref 3.5–5.1)
Sodium: 135 mmol/L (ref 135–145)

## 2023-10-01 LAB — CBC
HCT: 41.7 % (ref 36.0–46.0)
Hemoglobin: 13.5 g/dL (ref 12.0–15.0)
MCH: 31.5 pg (ref 26.0–34.0)
MCHC: 32.4 g/dL (ref 30.0–36.0)
MCV: 97.4 fL (ref 80.0–100.0)
Platelets: 153 10*3/uL (ref 150–400)
RBC: 4.28 MIL/uL (ref 3.87–5.11)
RDW: 16.3 % — ABNORMAL HIGH (ref 11.5–15.5)
WBC: 6.5 10*3/uL (ref 4.0–10.5)
nRBC: 0 % (ref 0.0–0.2)

## 2023-10-01 MED ORDER — ACETAMINOPHEN 325 MG PO TABS
650.0000 mg | ORAL_TABLET | Freq: Four times a day (QID) | ORAL | Status: DC | PRN
  Administered 2023-10-02: 650 mg via ORAL
  Filled 2023-10-01: qty 2

## 2023-10-01 MED ORDER — NITROGLYCERIN 0.4 MG SL SUBL: 0.4000 mg | SUBLINGUAL_TABLET | SUBLINGUAL | Status: DC | PRN

## 2023-10-01 MED ORDER — POTASSIUM CHLORIDE CRYS ER 20 MEQ PO TBCR
40.0000 meq | EXTENDED_RELEASE_TABLET | Freq: Once | ORAL | Status: AC
  Administered 2023-10-01: 40 meq via ORAL
  Filled 2023-10-01: qty 2

## 2023-10-01 MED ORDER — ACYCLOVIR 400 MG PO TABS
400.0000 mg | ORAL_TABLET | Freq: Two times a day (BID) | ORAL | Status: DC
  Administered 2023-10-02: 400 mg via ORAL
  Filled 2023-10-01 (×2): qty 1

## 2023-10-01 MED ORDER — PRAVASTATIN SODIUM 40 MG PO TABS
40.0000 mg | ORAL_TABLET | Freq: Every evening | ORAL | Status: DC
  Administered 2023-10-01: 40 mg via ORAL
  Filled 2023-10-01: qty 1

## 2023-10-01 MED ORDER — AMIODARONE HCL 200 MG PO TABS
100.0000 mg | ORAL_TABLET | Freq: Every day | ORAL | Status: DC
  Administered 2023-10-02: 100 mg via ORAL
  Filled 2023-10-01: qty 1

## 2023-10-01 MED ORDER — LEVOTHYROXINE SODIUM 100 MCG PO TABS
100.0000 ug | ORAL_TABLET | Freq: Every day | ORAL | Status: DC
  Administered 2023-10-02: 100 ug via ORAL
  Filled 2023-10-01: qty 1

## 2023-10-01 MED ORDER — APIXABAN 5 MG PO TABS: 5.0000 mg | ORAL_TABLET | Freq: Two times a day (BID) | ORAL | Status: DC

## 2023-10-01 MED ORDER — AMLODIPINE BESYLATE 5 MG PO TABS
2.5000 mg | ORAL_TABLET | Freq: Every day | ORAL | Status: DC
Start: 2023-10-02 — End: 2023-10-02

## 2023-10-01 MED ORDER — METOPROLOL TARTRATE 25 MG PO TABS
50.0000 mg | ORAL_TABLET | Freq: Two times a day (BID) | ORAL | Status: DC
  Administered 2023-10-02: 50 mg via ORAL
  Filled 2023-10-01 (×2): qty 2

## 2023-10-01 MED ORDER — ACETAMINOPHEN 650 MG RE SUPP: 650.0000 mg | Freq: Four times a day (QID) | RECTAL | Status: DC | PRN

## 2023-10-01 MED ORDER — HEPARIN (PORCINE) 25000 UT/250ML-% IV SOLN
950.0000 [IU]/h | INTRAVENOUS | Status: DC
  Administered 2023-10-02: 800 [IU]/h via INTRAVENOUS
  Filled 2023-10-01: qty 250

## 2023-10-01 MED ORDER — SPIRONOLACTONE 12.5 MG HALF TABLET: 25.0000 mg | ORAL_TABLET | Freq: Every day | ORAL | Status: DC

## 2023-10-01 MED ORDER — LOSARTAN POTASSIUM 50 MG PO TABS: 25.0000 mg | ORAL_TABLET | Freq: Every day | ORAL | Status: DC

## 2023-10-01 MED ORDER — NITROGLYCERIN 0.4 MG SL SUBL
0.4000 mg | SUBLINGUAL_TABLET | SUBLINGUAL | Status: AC | PRN
Start: 2023-10-01 — End: 2023-10-01
  Administered 2023-10-01 (×2): 0.4 mg via SUBLINGUAL
  Filled 2023-10-01: qty 1

## 2023-10-01 NOTE — Progress Notes (Signed)
ANTICOAGULATION CONSULT NOTE - Initial Consult  Pharmacy Consult for Heparin Indication: chest pain/ACS and atrial fibrillation  Allergies  Allergen Reactions   Quinolones Other (See Comments)    Patient on Amiodarone and can prolong QT   Penicillins Other (See Comments)    UNSPECIFIED REACTION  Patient does not remember reaction.  Has patient had a PCN reaction causing immediate rash, facial/tongue/throat swelling, SOB or lightheadedness with hypotension: no Has patient had a PCN reaction causing severe rash involving mucus membranes or skin necrosis: no Has patient had a PCN reaction that required hospitalization no Has patient had a PCN reaction occurring within the last 10 years: no If all of the above answers are "NO", then may proceed with Cephalosporin use.    Sulfa Antibiotics Other (See Comments)    UNSPECIFIED REACTION  "maybe vision issues? "    Patient Measurements: Height: 5\' 3"  (160 cm) Weight: 67.5 kg (148 lb 13 oz) IBW/kg (Calculated) : 52.4 Heparin Dosing Weight: 66 kg  Vital Signs: Temp: 97.6 F (36.4 C) (12/09 2233) Temp Source: Oral (12/09 2233) BP: 142/65 (12/09 2230) Pulse Rate: 62 (12/09 2230)  Labs: Recent Labs    10/01/23 1050 10/01/23 1250 10/01/23 1735  HGB 13.5  --   --   HCT 41.7  --   --   PLT 153  --   --   CREATININE 1.06*  --   --   TROPONINIHS 47* 67* 85*    Estimated Creatinine Clearance: 34.5 mL/min (A) (by C-G formula based on SCr of 1.06 mg/dL (H)).   Medical History: Past Medical History:  Diagnosis Date   Arthritis    some - per patient   Breast cancer (HCC)    breast cancer / left    Cataract    bilat    GERD (gastroesophageal reflux disease)    History of kidney stones    Hyperlipidemia    Hypertension    Hypothyroidism    Macular degeneration    Left   S/P TAVR (transcatheter aortic valve replacement) 09/03/2018   23 mm Edwards Sapien 3 transcatheter heart valve placed via percutaneous right transfemoral  approach    Severe aortic stenosis    Stress incontinence    Thyroid disease    Tinnitus     Medications:  (Not in a hospital admission)  Scheduled:   [START ON 10/02/2023] acyclovir  400 mg Oral BID   [START ON 10/02/2023] amiodarone  100 mg Oral Daily   [START ON 10/02/2023] levothyroxine  100 mcg Oral Q0600   [START ON 10/02/2023] losartan  25 mg Oral Daily   metoprolol tartrate  50 mg Oral BID   potassium chloride  40 mEq Oral Once   pravastatin  40 mg Oral QPM   [START ON 10/02/2023] spironolactone  25 mg Oral Daily   Infusions:  PRN: acetaminophen **OR** acetaminophen, nitroGLYCERIN  Assessment: 36 yof with a history of HTN, HLD, hypothyroidism, AF on eliquis, severe aortic stenosis s/p TAVR in 2019, bilateral carotid artery stenosis, HF, IgG lambda multiple myeloma, GERD, CKD, CVA, hx of breast cancer. Patient is presenting with chest pain. Heparin per pharmacy consult placed for chest pain/ACS and atrial fibrillation.  Patient is on apixaban prior to arrival. Last dose 12/9 ~1000. Will require aPTT monitoring due to likely falsely high anti-Xa level secondary to DOAC use.  Hgb 13.5; plt 153  Goal of Therapy:  Heparin level 0.3-0.7 units/ml aPTT 66-102 seconds Monitor platelets by anticoagulation protocol: Yes   Plan:  No  initial heparin bolus Start heparin infusion at 800 units/hr Check aPTT & anti-Xa level in 8 hours and daily while on heparin Continue to monitor via aPTT until levels are correlated Continue to monitor H&H and platelets  Delmar Landau, PharmD, BCPS 10/01/2023 10:50 PM ED Clinical Pharmacist -  [phone]

## 2023-10-01 NOTE — ED Provider Notes (Signed)
[facility] Provider Note   CSN: [id number] Arrival date & time: 10/01/23  1017    History  Chief Complaint  Patient presents with   Chest Pain    Rwanda B Maillard is a 89 y.o. female history of A-fib, CHF, multiple myeloma, prior embolic stroke on chronic anticoagulation here for evaluation of dizziness.  States earlier today she had an episode of dizziness states went to lay down she folic her heart was racing and she had some chest tightness.  She was called by EMS.  EMS noted she was in A-fib with RVR with heart rates ranging from 115-135.  They had patient sit up and bear down and she subsequently converted to normal sinus rhythm.  Her limited chest tightness when she got here 6 hours ago however states she has been chest pain-free since.  States she is normally able to complete her ADLs without difficulty.  She has a home health nurse that comes every day to check on her and help her with things.  She denies any headache, syncope, back pain, PND, orthopnea, lower extremity swelling.  States she feels otherwise well.  She has no complaints currently  Given ASA 324 mg by EMS  HPI     Home Medications Prior to Admission medications   Medication Sig Start Date End Date Taking? Authorizing Provider  acyclovir (ZOVIRAX) 400 MG tablet Take 1 tablet (400 mg total) by mouth 2 (two) times daily. 03/16/23  Yes Jaci Standard, MD  albuterol (VENTOLIN HFA) 108 (90 Base) MCG/ACT inhaler Inhale 2 puffs into the lungs every 6 (six) hours as needed for wheezing or shortness of breath. 05/10/23  Yes Luciano Cutter, MD  amiodarone (PACERONE) 200 MG tablet Take 1 tablet (200 mg total) by mouth daily. Patient taking differently: Take 100 mg by mouth daily. 04/20/23  Yes Yates Decamp, MD  apixaban (ELIQUIS) 5 MG TABS tablet Take 1 tablet (5 mg total) by mouth 2 (two) times daily. 03/01/23 10/01/23 Yes Yates Decamp, MD  Artificial Tear Solution (SOOTHE XP)  SOLN Place 1 drop into both eyes every evening.   Yes [provider]  Cholecalciferol (VITAMIN D3) 50 MCG (2000 UT) capsule Take 1 capsule (2,000 Units total) by mouth daily. 09/11/22  Yes Jaci Standard, MD  dexamethasone (DECADRON) 4 MG tablet Take 5 tablets (20 mg total) by mouth every 14 (fourteen) days. Take in the morning with food on treatment days Patient taking differently: Take 20 mg by mouth every 21 ( twenty-one) days. 06/01/23  Yes Jaci Standard, MD  docusate sodium (COLACE) 100 MG capsule Take 100 mg by mouth daily as needed for mild constipation.   Yes [provider]  lenalidomide (REVLIMID) 10 MG capsule Take 1 capsule (10 mg total) by mouth daily. [employer] Auth #  [id number] Date Obtained 09/24/23 Take 1 capsule daily for 21 days then none for 7 days 09/24/23  Yes Jaci Standard, MD  levothyroxine (SYNTHROID, LEVOTHROID) 100 MCG tablet Take 100 mcg by mouth daily before breakfast. 12/29/15  Yes [provider]  losartan (COZAAR) 25 MG tablet Take 1 tablet (25 mg total) by mouth daily. 07/17/23  Yes Azucena Fallen, MD  metoprolol tartrate (LOPRESSOR) 50 MG tablet TAKE 1 TABLET BY MOUTH TWICE A DAY 09/19/23  Yes Yates Decamp, MD  Multiple Vitamins-Minerals (PRESERVISION AREDS) CAPS Take 1 capsule by mouth 2 (two) times daily.   Yes [provider]  polyethylene glycol (MIRALAX / GLYCOLAX) 17 g packet Take 17 g by mouth daily as needed for mild constipation.   Yes [provider]  pravastatin (PRAVACHOL) 40 MG tablet Take 40 mg by mouth every evening.   Yes [provider]  spironolactone (ALDACTONE) 25 MG tablet Take 1 tablet (25 mg total) by mouth daily. 07/17/23  Yes Azucena Fallen, MD  ciprofloxacin (CILOXAN) 0.3 % ophthalmic solution Place 1 drop into the right eye in the morning, at noon, in the evening, and at bedtime. FOR 2 DAYS AFTER EACH MONTHLY EYE INJECTION Patient not taking: Reported on 10/01/2023 08/22/23    [provider]  [employer] VITAMIN B12 1000 MCG tablet TAKE 1 TABLET BY MOUTH EVERY DAY Patient not taking: Reported on 10/01/2023 02/15/23   [name] T, PA-C  KLOR-CON M10 10 MEQ tablet Take 10 mEq by mouth daily. Patient not taking: Reported on 10/01/2023 06/15/23   [provider]  predniSONE (DELTASONE) 50 MG tablet Take 50 mg by mouth daily. Patient not taking: Reported on 10/01/2023 09/26/23   [provider]      Allergies    Quinolones, Penicillins, and Sulfa antibiotics    Review of Systems   Review of Systems  Constitutional: Negative.   HENT: Negative.    Respiratory: Negative.    Cardiovascular:  Positive for chest pain and palpitations. Negative for leg swelling.  Gastrointestinal: Negative.   Genitourinary: Negative.   Musculoskeletal: Negative.   Skin: Negative.   Neurological:  Positive for dizziness.  All other systems reviewed and are negative.   Physical Exam Updated Vital Signs BP (!) 109/57   Pulse 65   Temp 97.6 F (36.4 C)   Resp 20   Ht 5\' 3"  (1.6 m)   Wt 67.5 kg   SpO2 99%   BMI 26.36 kg/m  Physical Exam Vitals and nursing note reviewed.  Constitutional:      General: She is not in acute distress.    Appearance: She is well-developed. She is not ill-appearing, toxic-appearing or diaphoretic.  HENT:     Head: Atraumatic.  Eyes:     Pupils: Pupils are equal, round, and reactive to light.  Cardiovascular:     Rate and Rhythm: Normal rate.     Pulses:          Radial pulses are 2+ on the right side and 2+ on the left side.       Dorsalis pedis pulses are 2+ on the right side and 2+ on the left side.     Heart sounds: Normal heart sounds.  Pulmonary:     Effort: Pulmonary effort is normal. No respiratory distress.     Breath sounds: Normal breath sounds.     Comments: Clear bilaterally, speaks in full sentences without difficulty Chest:     Comments: Nontender chest wall, no crepitus Abdominal:     General: Bowel  sounds are normal. There is no distension.     Palpations: Abdomen is soft.     Comments: Soft, nontender  Musculoskeletal:        General: Normal range of motion.     Cervical back: Normal range of motion.     Right lower leg: No tenderness. No edema.     Left lower leg: No tenderness. No edema.     Comments: No bony tenderness, compartments soft, no lower extremity edema  Skin:    General: Skin is warm and dry.     Comments: No obvious rashes or  lesions on exposed skin  Neurological:     General: No focal deficit present.     Mental Status: She is alert.  Psychiatric:        Mood and Affect: Mood normal.    ED Results / Procedures / Treatments   Labs (all labs ordered are listed, but only abnormal results are displayed) Labs Reviewed  BASIC METABOLIC PANEL - Abnormal; Notable for the following components:      Result Value   Potassium 3.2 (*)    BUN 33 (*)    Creatinine, Ser 1.06 (*)    Calcium 8.6 (*)    GFR, Estimated 51 (*)    All other components within normal limits  CBC - Abnormal; Notable for the following components:   RDW 16.3 (*)    All other components within normal limits  TROPONIN I (HIGH SENSITIVITY) - Abnormal; Notable for the following components:   Troponin I (High Sensitivity) 47 (*)    All other components within normal limits  TROPONIN I (HIGH SENSITIVITY) - Abnormal; Notable for the following components:   Troponin I (High Sensitivity) 67 (*)    All other components within normal limits  TROPONIN I (HIGH SENSITIVITY) - Abnormal; Notable for the following components:   Troponin I (High Sensitivity) 85 (*)    All other components within normal limits    EKG EKG Interpretation Date/Time:  Monday October 01 2023 10:12:29 EST Ventricular Rate:  78 PR Interval:  190 QRS Duration:  88 QT Interval:  422 QTC Calculation: 481 R Axis:   33  Text Interpretation: Normal sinus rhythm Possible Left atrial enlargement Borderline ECG Confirmed by  Vonita Moss [phone]) on 10/01/2023 4:08:44 PM  Radiology DG Chest 2 View  Result Date: 10/01/2023 CLINICAL DATA:  Chest pain. EXAM: CHEST - 2 VIEW COMPARISON:  07/16/2023. FINDINGS: Trachea is midline. Heart is enlarged, stable. Aortic valve replacement. Thoracic aorta is calcified. Minimal streaky scarring in the lingula. Lungs are otherwise clear. No pleural fluid. Thoracic vertebral body augmentations. IMPRESSION: No acute findings. Electronically Signed   By: Leanna Battles M.D.   On: 10/01/2023 15:54    Procedures .Critical Care  Performed by: Linwood Dibbles, PA-C Authorized by: Linwood Dibbles, PA-C   Critical care provider statement:    Critical care time (minutes):  31   Critical care was necessary to treat or prevent imminent or life-threatening deterioration of the following conditions:  Cardiac failure   Critical care was time spent personally by me on the following activities:  Development of treatment plan with patient or surrogate, discussions with consultants, evaluation of patient's response to treatment, examination of patient, ordering and review of laboratory studies, ordering and review of radiographic studies, ordering and performing treatments and interventions, pulse oximetry, re-evaluation of patient's condition and review of old charts     Medications Ordered in ED Medications  nitroGLYCERIN (NITROSTAT) SL tablet 0.4 mg (has no administration in time range)    ED Course/ Medical Decision Making/ A&P Clinical Course as of 10/01/23 2112  Mon Oct 01, 2023  2055 Dr. Loney Loh with medicine [BH]    Clinical Course User Index [BH] Craig Wisnewski A, PA-C   89 year old multiple medical problems here for evaluation of episode of dizziness, palpitations and chest tightness.  Lasted approximately 30 minutes.  EMS arrival she was found to be in A-fib with RVR, she subsequently corrected to normal sinus rhythm after vagal maneuver with EMS.  On arrival to the ED  she did have  some mild chest tightness which subsequently resolved.  She states she has been chest pain-free since she initially got here.  Labs and Imaging personally viewed and interpreted: CBC without leukocytosis Metabolic panel potassium 3.2, creatinine 1.06 Troponin 47--67--85 EKG without ischemic changes Chest x-ray without significant abnormality   Discussed with Dr. Wyline Mood with cardiology.  We discussed her labs, imaging, history.  She reviewed her medical record.  Stated patient does have history of vasospasms.  She is recommending adding Norvasc 2.5 daily and follow-up in the office.  She does not feel she needs third troponin at this time.  Attending went to see patient.  She developed recurrent chest pain on the left side.  She was not in A-fib at this time.  Dr. Jarold Motto recommended repeat troponin given she has any chest pain.  Reviewed EMS run report, was in A-fib with heart rate 115-130, self converted when siting up and chair  Repeat troponin elevated at 85 will discuss with cardiology.  Patient reassessed.  She still having chest pain.  Will give nitroglycerin.  She already had full dose aspirin with EMS.  With uptrending troponins with active chest pain.  Concern for NSTEMI  Discussed with cardiology fellow Hulan Saas, recommend medicine admission given her multiple medical problems, will see in consult  CONSULT with Dr. Loney Loh with medicine who is agreeable to evaluate patient for admission.  The patient appears reasonably stabilized for admission considering the current resources, flow, and capabilities available in the ED at this time, and I doubt any other [facility] requiring further screening and/or treatment in the ED prior to admission.                                 Medical Decision Making Amount and/or Complexity of Data Reviewed Independent Historian: EMS External Data Reviewed: labs, radiology, ECG and notes. Labs: ordered. Decision-making details documented  in ED Course. Radiology: ordered and independent interpretation performed. Decision-making details documented in ED Course. ECG/medicine tests: ordered and independent interpretation performed. Decision-making details documented in ED Course.  Risk OTC drugs. Prescription drug management. Parenteral controlled substances. Decision regarding hospitalization. Diagnosis or treatment significantly limited by social determinants of health.          Final Clinical Impression(s) / ED Diagnoses Final diagnoses:  NSTEMI (non-ST elevated myocardial infarction) (HCC)  History of coronary vasospasm  Chronic anticoagulation  Atrial fib/flutter, transient [facility])    Rx / DC Orders ED Discharge Orders     None         Sholonda Jobst A, PA-C 10/01/23 2113    Rondel Baton, MD 10/03/23 1049

## 2023-10-01 NOTE — ED Triage Notes (Signed)
Chest pain that started today with dizziness. Hx of afib. Alert and oriented x 4. Took 324mg  Aspirin at home.   EMS VS:  BP 158/64 74 98% on RA

## 2023-10-01 NOTE — ED Notes (Signed)
Meteorology not given due to the patient taking nitroglycerin. Also Heart rate is in the Lows 60's.

## 2023-10-01 NOTE — H&P (Signed)
History and Physical    [name] MVH:[id number] DOB: 04/15/1934 DOA: 10/01/2023  PCP: Irena Reichmann, DO  Patient coming from: Home  Chief Complaint: Chest pain  HPI: Zoe [name] is a 89 y.o. female with medical history significant of hypertension, hyperlipidemia, hypothyroidism, paroxysmal A-fib on Eliquis, possible mild coronary spasm by coronary angiogram in 2014, severe aortic stenosis status post TAVR in 2019, asymptomatic bilateral carotid artery stenosis, chronic HFpEF, IgG lambda multiple myeloma, GERD, CKD stage IIIa, peripheral neuropathy, history of breast cancer status post lumpectomy in 2000, CVA presenting to the ED with complaints of chest pain, palpitations, and dizziness.  On EMS arrival, she was noted to be in A-fib with RVR with heart rate ranging from 115-135.  They had the patient sit up and bear down and she subsequently converted to normal sinus rhythm.  Vital signs on arrival to the ED: Temperature 98.6 F, pulse 75, respiratory rate 15, blood pressure 109/86, and SpO2 98% on room air.  Labs notable for WBC count 6.5, hemoglobin 13.5, potassium 3.2, creatinine 1.0, troponin 47> 67> 85.  EKG showing normal sinus rhythm and no acute ischemic changes.  Chest x-ray showing no acute findings. EDP consulted on-call cardiologist Dr. Hulan Saas who recommended holding off starting IV heparin at this time.  Cardiology will evaluate the patient and give further recommendations.  Sublingual nitroglycerin ordered.  TRH called to admit.  Patient states this morning around 9 AM she was feeling dizzy and started having substernal chest pain.  When EMS arrived, she was told that she was in A-fib and her heart rate was ranging between 115-135.  States when EMS moved her to the stretcher, she was told that she was no longer in A-fib and that her heart rate was now normal.  Subsequently her symptoms improved.  Dizziness has resolved but still having some mild chest discomfort.  No  shortness of breath or fevers.  Reports chronic cough which he attributes to seasonal allergies/postnasal drip.  She denies history of exertional chest pain in the past.  Denies history of blood clots.  She is compliant with Eliquis.  No other complaints.  Review of Systems:  Review of Systems  All other systems reviewed and are negative.   Past Medical History:  Diagnosis Date   Arthritis    some - per patient   Breast cancer (HCC)    breast cancer / left    Cataract    bilat    GERD (gastroesophageal reflux disease)    History of kidney stones    Hyperlipidemia    Hypertension    Hypothyroidism    Macular degeneration    Left   S/P TAVR (transcatheter aortic valve replacement) 09/03/2018   23 mm Edwards Sapien 3 transcatheter heart valve placed via percutaneous right transfemoral approach    Severe aortic stenosis    Stress incontinence    Thyroid disease    Tinnitus     Past Surgical History:  Procedure Laterality Date   ABDOMINAL HYSTERECTOMY  1970's   BACK SURGERY     BREAST LUMPECTOMY  12/1998   lumpectomy   CARDIAC CATHETERIZATION     CARDIOVERSION N/A 04/18/2023   Procedure: CARDIOVERSION;  Surgeon: Tessa Lerner, DO;  Location: [facility] INVASIVE CV LAB;  Service: Cardiovascular;  Laterality: N/A;   CARDIOVERSION N/A 04/20/2023   Procedure: CARDIOVERSION;  Surgeon: Yates Decamp, MD;  Location: [facility] INVASIVE CV LAB;  Service: Cardiovascular;  Laterality: N/A;   EYE SURGERY     cataract surgery  bilat    INTRAOPERATIVE TRANSTHORACIC ECHOCARDIOGRAM N/A 09/03/2018   Procedure: INTRAOPERATIVE TRANSTHORACIC ECHOCARDIOGRAM;  Surgeon: Kathleene Hazel, MD;  Location: [facility] OR;  Service: Open Heart Surgery;  Laterality: N/A;   KYPHOPLASTY N/A 09/07/2022   Procedure: THORACIC EIGHT KYPHOPLASTY;  Surgeon: Venita Lick, MD;  Location: [facility] OR;  Service: Orthopedics;  Laterality: N/A;  1 hr Local with [facility] 3 C-Bed   LITHOTRIPSY     Right total knee     2018 Dr. Charlann Boxer    RIGHT/LEFT HEART CATH AND CORONARY ANGIOGRAPHY N/A 08/06/2018   Procedure: RIGHT/LEFT HEART CATH AND CORONARY ANGIOGRAPHY;  Surgeon: Yates Decamp, MD;  Location: [facility] INVASIVE CV LAB;  Service: Cardiovascular;  Laterality: N/A;   THYROIDECTOMY, PARTIAL  1975   TONSILLECTOMY     as a child - patient not sure of exact date   TOTAL KNEE ARTHROPLASTY Left 03/13/2016   Procedure: TOTAL KNEE ARTHROPLASTY;  Surgeon: Durene Romans, MD;  Location: [facility] ORS;  Service: Orthopedics;  Laterality: Left;   TOTAL KNEE ARTHROPLASTY Right 06/18/2017   Procedure: RIGHT TOTAL KNEE ARTHROPLASTY;  Surgeon: Durene Romans, MD;  Location: [facility] ORS;  Service: Orthopedics;  Laterality: Right;   TRANSCATHETER AORTIC VALVE REPLACEMENT, TRANSFEMORAL N/A 09/03/2018   Procedure: TRANSCATHETER AORTIC VALVE REPLACEMENT, TRANSFEMORAL;  Surgeon: Kathleene Hazel, MD;  Location: [facility] OR;  Service: Open Heart Surgery;  Laterality: N/A;     reports that she has never smoked. She has never used smokeless tobacco. She reports that she does not drink alcohol and does not use drugs.  Allergies  Allergen Reactions   Quinolones Other (See Comments)    Patient on Amiodarone and can prolong QT   Penicillins Other (See Comments)    UNSPECIFIED REACTION  Patient does not remember reaction.  Has patient had a PCN reaction causing immediate rash, facial/tongue/throat swelling, SOB or lightheadedness with hypotension: no Has patient had a PCN reaction causing severe rash involving mucus membranes or skin necrosis: no Has patient had a PCN reaction that required hospitalization no Has patient had a PCN reaction occurring within the last 10 years: no If all of the above answers are "NO", then may proceed with Cephalosporin use.    Sulfa Antibiotics Other (See Comments)    UNSPECIFIED REACTION  "maybe vision issues? "    Family History  Problem Relation Age of Onset   Diabetes Mother    Stroke Mother        Carotid artery disease   Heart  disease Father        CAD   Coronary artery disease Father    Diabetes Sister     Prior to Admission medications   Medication Sig Start Date End Date Taking? Authorizing Provider  acyclovir (ZOVIRAX) 400 MG tablet Take 1 tablet (400 mg total) by mouth 2 (two) times daily. 03/16/23  Yes Jaci Standard, MD  albuterol (VENTOLIN HFA) 108 (90 Base) MCG/ACT inhaler Inhale 2 puffs into the lungs every 6 (six) hours as needed for wheezing or shortness of breath. 05/10/23  Yes Luciano Cutter, MD  amiodarone (PACERONE) 200 MG tablet Take 1 tablet (200 mg total) by mouth daily. Patient taking differently: Take 100 mg by mouth daily. 04/20/23  Yes Yates Decamp, MD  apixaban (ELIQUIS) 5 MG TABS tablet Take 1 tablet (5 mg total) by mouth 2 (two) times daily. 03/01/23 10/01/23 Yes Yates Decamp, MD  Artificial Tear Solution (SOOTHE XP) SOLN Place 1 drop into both eyes every evening.   Yes  [provider]  Cholecalciferol (VITAMIN D3) 50 MCG (2000 UT) capsule Take 1 capsule (2,000 Units total) by mouth daily. 09/11/22  Yes Jaci Standard, MD  dexamethasone (DECADRON) 4 MG tablet Take 5 tablets (20 mg total) by mouth every 14 (fourteen) days. Take in the morning with food on treatment days Patient taking differently: Take 20 mg by mouth every 21 ( twenty-one) days. 06/01/23  Yes Jaci Standard, MD  docusate sodium (COLACE) 100 MG capsule Take 100 mg by mouth daily as needed for mild constipation.   Yes [provider]  lenalidomide (REVLIMID) 10 MG capsule Take 1 capsule (10 mg total) by mouth daily. [employer] Auth #  [id number] Date Obtained 09/24/23 Take 1 capsule daily for 21 days then none for 7 days 09/24/23  Yes Jaci Standard, MD  levothyroxine (SYNTHROID, LEVOTHROID) 100 MCG tablet Take 100 mcg by mouth daily before breakfast. 12/29/15  Yes [provider]  losartan (COZAAR) 25 MG tablet Take 1 tablet (25 mg total) by mouth daily. 07/17/23  Yes Azucena Fallen, MD  metoprolol  tartrate (LOPRESSOR) 50 MG tablet TAKE 1 TABLET BY MOUTH TWICE A DAY 09/19/23  Yes Yates Decamp, MD  Multiple Vitamins-Minerals (PRESERVISION AREDS) CAPS Take 1 capsule by mouth 2 (two) times daily.   Yes [provider]  polyethylene glycol (MIRALAX / GLYCOLAX) 17 g packet Take 17 g by mouth daily as needed for mild constipation.   Yes [provider]  pravastatin (PRAVACHOL) 40 MG tablet Take 40 mg by mouth every evening.   Yes [provider]  spironolactone (ALDACTONE) 25 MG tablet Take 1 tablet (25 mg total) by mouth daily. 07/17/23  Yes Azucena Fallen, MD  ciprofloxacin (CILOXAN) 0.3 % ophthalmic solution Place 1 drop into the right eye in the morning, at noon, in the evening, and at bedtime. FOR 2 DAYS AFTER EACH MONTHLY EYE INJECTION Patient not taking: Reported on 10/01/2023 08/22/23   [provider]  [employer] VITAMIN B12 1000 MCG tablet TAKE 1 TABLET BY MOUTH EVERY DAY Patient not taking: Reported on 10/01/2023 02/15/23   [name] T, PA-C  KLOR-CON M10 10 MEQ tablet Take 10 mEq by mouth daily. Patient not taking: Reported on 10/01/2023 06/15/23   [provider]  predniSONE (DELTASONE) 50 MG tablet Take 50 mg by mouth daily. Patient not taking: Reported on 10/01/2023 09/26/23   [provider]    Physical Exam: Vitals:   10/01/23 1845 10/01/23 1900 10/01/23 2000 10/01/23 2100  BP:  (!) 141/61 (!) 147/61 (!) 109/57  Pulse:  66 65 65  Resp:  (!) 21 (!) 21 20  Temp: 97.6 F (36.4 C)     TempSrc:      SpO2:  95% 99% 99%  Weight:      Height:        Physical Exam Vitals reviewed.  Constitutional:      General: She is not in acute distress. HENT:     Head: Normocephalic and atraumatic.  Eyes:     Extraocular Movements: Extraocular movements intact.  Cardiovascular:     Rate and Rhythm: Normal rate and regular rhythm.     Pulses: Normal pulses.  Pulmonary:     Effort: Pulmonary effort is normal. No respiratory distress.      Breath sounds: Normal breath sounds. No wheezing or rales.  Abdominal:     General: Bowel sounds are normal. There is no distension.     Palpations: Abdomen  is soft.     Tenderness: There is no abdominal tenderness. There is no guarding.  Musculoskeletal:     Cervical back: Normal range of motion.     Right lower leg: No edema.     Left lower leg: No edema.  Skin:    General: Skin is warm and dry.  Neurological:     General: No focal deficit present.     Mental Status: She is alert and oriented to person, place, and time.     Labs on Admission: I have personally reviewed following labs and imaging studies  CBC: Recent Labs  Lab 10/01/23 1050  WBC 6.5  HGB 13.5  HCT 41.7  MCV 97.4  PLT 153   Basic Metabolic Panel: Recent Labs  Lab 10/01/23 1050  NA 135  K 3.2*  CL 100  CO2 24  GLUCOSE 92  BUN 33*  CREATININE 1.06*  CALCIUM 8.6*   GFR: Estimated Creatinine Clearance: 34.5 mL/min (A) (by C-G formula based on SCr of 1.06 mg/dL (H)). Liver Function Tests: No results for input(s): "AST", "ALT", "ALKPHOS", "BILITOT", "PROT", "ALBUMIN" in the last 168 hours. No results for input(s): "LIPASE", "AMYLASE" in the last 168 hours. No results for input(s): "AMMONIA" in the last 168 hours. Coagulation Profile: No results for input(s): "INR", "PROTIME" in the last 168 hours. Cardiac Enzymes: No results for input(s): "CKTOTAL", "CKMB", "CKMBINDEX", "TROPONINI" in the last 168 hours. BNP (last 3 results) No results for input(s): "PROBNP" in the last 8760 hours. HbA1C: No results for input(s): "HGBA1C" in the last 72 hours. CBG: No results for input(s): "GLUCAP" in the last 168 hours. Lipid Profile: No results for input(s): "CHOL", "HDL", "LDLCALC", "TRIG", "CHOLHDL", "LDLDIRECT" in the last 72 hours. Thyroid Function Tests: No results for input(s): "TSH", "T4TOTAL", "FREET4", "T3FREE", "THYROIDAB" in the last 72 hours. Anemia Panel: No results for input(s): "VITAMINB12",  "FOLATE", "FERRITIN", "TIBC", "IRON", "RETICCTPCT" in the last 72 hours. Urine analysis:    Component Value Date/Time   COLORURINE YELLOW 09/05/2023 1230   APPEARANCEUR CLOUDY (A) 09/05/2023 1230   LABSPEC 1.018 09/05/2023 1230   PHURINE 5.0 09/05/2023 1230   GLUCOSEU NEGATIVE 09/05/2023 1230   HGBUR NEGATIVE 09/05/2023 1230   BILIRUBINUR NEGATIVE 09/05/2023 1230   BILIRUBINUR negative 07/21/2023 1252   KETONESUR NEGATIVE 09/05/2023 1230   PROTEINUR NEGATIVE 09/05/2023 1230   UROBILINOGEN 0.2 07/21/2023 1252   NITRITE NEGATIVE 09/05/2023 1230   LEUKOCYTESUR NEGATIVE 09/05/2023 1230    Radiological Exams on Admission: DG Chest 2 View  Result Date: 10/01/2023 CLINICAL DATA:  Chest pain. EXAM: CHEST - 2 VIEW COMPARISON:  07/16/2023. FINDINGS: Trachea is midline. Heart is enlarged, stable. Aortic valve replacement. Thoracic aorta is calcified. Minimal streaky scarring in the lingula. Lungs are otherwise clear. No pleural fluid. Thoracic vertebral body augmentations. IMPRESSION: No acute findings. Electronically Signed   By: Leanna Battles M.D.   On: 10/01/2023 15:54    Assessment and Plan  Chest pain Troponin 47> 67> 85. EKG without acute ischemic changes.  Cardiac cath done in October 2019 was showing normal coronary arteries.  PE less likely given no hypoxia or clinical signs of DVT.  Patient is chronically anticoagulated with Eliquis.  Unclear whether chest pain is due to NSTEMI versus related to A-fib with RVR.  Overall, patient reports improvement of her symptoms after converting to sinus rhythm, no longer dizzy and chest pain improved but not completely resolved.  EDP discussed with on-call cardiologist, recommended holding off starting IV heparin at this time  until they evaluate the patient.  Continue cardiac monitoring, trend troponin, and sublingual nitroglycerin as needed.  Paroxysmal A-fib with RVR Patient initially noted to be in A-fib with RVR on EMS arrival with heart rate  ranging from 115-135.  They had the patient sit up and bear down and she subsequently converted to normal sinus rhythm and has remained in sinus rhythm since arrival to the ED.  Continue Eliquis, amiodarone, and metoprolol.  Cardiology consulted.  Mild hypokalemia Monitor potassium and magnesium levels, continue to replace as needed.  Chronic HFpEF Echo done in June 2024 showing EF 60 to 65%, grade 2 diastolic dysfunction, mildly elevated pulmonary artery systolic pressure, left atrium moderately dilated, mild MVR, mild AVR.  IgG lambda multiple myeloma Followed by oncology and currently on Revlimid which per pharmacy is not available. Outpatient oncology follow-up.  Hypertension Stable.  Continue losartan, metoprolol, and spironolactone.  Hyperlipidemia Continue pravastatin.  Hypothyroidism Continue Synthroid and check TSH.  History of CVA Continue Eliquis and pravastatin.  CKD stage IIIa Creatinine stable, continue to monitor labs.  DVT prophylaxis: Eliquis Code Status: Full Code (discussed with the patient) Family Communication: No family available at this time. Consults called: Cardiology Level of care: Progressive Care Unit Admission status: It is my clinical opinion that referral for OBSERVATION is reasonable and necessary in this patient based on the above information provided. The aforementioned taken together are felt to place the patient at high risk for further clinical deterioration. However, it is anticipated that the patient may be medically stable for discharge from the hospital within 24 to 48 hours.  John Giovanni MD [facility]  If 7PM-7AM, please contact night-coverage [email or web address]  10/01/2023, 9:05 PM

## 2023-10-01 NOTE — Consult Note (Signed)
Cardiology Consultation:   Patient ID: ALVETA SWADER MRN: [id number]; DOB: [birth year]  Admit date: 10/01/2023 Date of Consult: 10/01/2023  Primary Care Provider: Irena Reichmann, DO [facility] Cardiologist: Yates Decamp, MD  [facility] Electrophysiologist:  None    Patient Profile:   Zoe [name] is a 89 y.o. female with a hx of A-fib, CHF, multiple myeloma, aortic stenosis s/p TAVR in 2019, prior embolic stroke on chronic anticoagulation and afib who is being seen today for the evaluation of chest pain at the request of the emergency department.   History of Present Illness:   Earlier today, the patient had an episode of dizziness after standing up from breakfast.  When she went to lay down, she felt her heart racing and she had associated chest tightness for which she called EMS.  This chest pain arrived for about 45 minutes. Upon EMS arrival, she was noted to be in A-fib with RVR with heart rates ranging from 115-135.  That the patient bear down, and she subsequently converted to normal sinus rhythm.  On arrival to the emergency department, she was chest pain free.  Troponins were checked, and trended from 47 -> 67.  Case was discussed with cardiology, who recommended low-dose amlodipine 2.5 mg given history of coronary vasospasm.  Emergency department was planning for discharge, however upon rechecking on her this evening, she noted she was having recurrent chest pain and dyspnea.  On my exam, she reports having chest pain now for the past 2 hours. She reports it is a 6-7/10. She has difficulty describing it, but states "it just hurts". It does not radiate. Denies associated nausea or vomiting. She does not think she has felt short of breath.  Past Medical History:  Diagnosis Date   Arthritis    some - per patient   Breast cancer (HCC)    breast cancer / left    Cataract    bilat    GERD (gastroesophageal reflux disease)    History of kidney stones    Hyperlipidemia     Hypertension    Hypothyroidism    Macular degeneration    Left   S/P TAVR (transcatheter aortic valve replacement) 09/03/2018   23 mm Edwards Sapien 3 transcatheter heart valve placed via percutaneous right transfemoral approach    Severe aortic stenosis    Stress incontinence    Thyroid disease    Tinnitus     Past Surgical History:  Procedure Laterality Date   ABDOMINAL HYSTERECTOMY  1970's   BACK SURGERY     BREAST LUMPECTOMY  12/1998   lumpectomy   CARDIAC CATHETERIZATION     CARDIOVERSION N/A 04/18/2023   Procedure: CARDIOVERSION;  Surgeon: Tessa Lerner, DO;  Location: [facility] INVASIVE CV LAB;  Service: Cardiovascular;  Laterality: N/A;   CARDIOVERSION N/A 04/20/2023   Procedure: CARDIOVERSION;  Surgeon: Yates Decamp, MD;  Location: [facility] INVASIVE CV LAB;  Service: Cardiovascular;  Laterality: N/A;   EYE SURGERY     cataract surgery bilat    INTRAOPERATIVE TRANSTHORACIC ECHOCARDIOGRAM N/A 09/03/2018   Procedure: INTRAOPERATIVE TRANSTHORACIC ECHOCARDIOGRAM;  Surgeon: Kathleene Hazel, MD;  Location: [facility] OR;  Service: Open Heart Surgery;  Laterality: N/A;   KYPHOPLASTY N/A 09/07/2022   Procedure: THORACIC EIGHT KYPHOPLASTY;  Surgeon: Venita Lick, MD;  Location: [facility] OR;  Service: Orthopedics;  Laterality: N/A;  1 hr Local with [facility] 3 C-Bed   LITHOTRIPSY     Right total knee     2018 Dr. Charlann Boxer   RIGHT/LEFT  HEART CATH AND CORONARY ANGIOGRAPHY N/A 08/06/2018   Procedure: RIGHT/LEFT HEART CATH AND CORONARY ANGIOGRAPHY;  Surgeon: Yates Decamp, MD;  Location: [facility] INVASIVE CV LAB;  Service: Cardiovascular;  Laterality: N/A;   THYROIDECTOMY, PARTIAL  1975   TONSILLECTOMY     as a child - patient not sure of exact date   TOTAL KNEE ARTHROPLASTY Left 03/13/2016   Procedure: TOTAL KNEE ARTHROPLASTY;  Surgeon: Durene Romans, MD;  Location: [facility] ORS;  Service: Orthopedics;  Laterality: Left;   TOTAL KNEE ARTHROPLASTY Right 06/18/2017   Procedure: RIGHT TOTAL KNEE ARTHROPLASTY;  Surgeon:  Durene Romans, MD;  Location: [facility] ORS;  Service: Orthopedics;  Laterality: Right;   TRANSCATHETER AORTIC VALVE REPLACEMENT, TRANSFEMORAL N/A 09/03/2018   Procedure: TRANSCATHETER AORTIC VALVE REPLACEMENT, TRANSFEMORAL;  Surgeon: Kathleene Hazel, MD;  Location: [facility] OR;  Service: Open Heart Surgery;  Laterality: N/A;     Home Medications:  Prior to Admission medications   Medication Sig Start Date End Date Taking? Authorizing Provider  acyclovir (ZOVIRAX) 400 MG tablet Take 1 tablet (400 mg total) by mouth 2 (two) times daily. 03/16/23  Yes Jaci Standard, MD  albuterol (VENTOLIN HFA) 108 (90 Base) MCG/ACT inhaler Inhale 2 puffs into the lungs every 6 (six) hours as needed for wheezing or shortness of breath. 05/10/23  Yes Luciano Cutter, MD  amiodarone (PACERONE) 200 MG tablet Take 1 tablet (200 mg total) by mouth daily. Patient taking differently: Take 100 mg by mouth daily. 04/20/23  Yes Yates Decamp, MD  apixaban (ELIQUIS) 5 MG TABS tablet Take 1 tablet (5 mg total) by mouth 2 (two) times daily. 03/01/23 10/01/23 Yes Yates Decamp, MD  Artificial Tear Solution (SOOTHE XP) SOLN Place 1 drop into both eyes every evening.   Yes [provider]  Cholecalciferol (VITAMIN D3) 50 MCG (2000 UT) capsule Take 1 capsule (2,000 Units total) by mouth daily. 09/11/22  Yes Jaci Standard, MD  dexamethasone (DECADRON) 4 MG tablet Take 5 tablets (20 mg total) by mouth every 14 (fourteen) days. Take in the morning with food on treatment days Patient taking differently: Take 20 mg by mouth every 21 ( twenty-one) days. 06/01/23  Yes Jaci Standard, MD  docusate sodium (COLACE) 100 MG capsule Take 100 mg by mouth daily as needed for mild constipation.   Yes [provider]  lenalidomide (REVLIMID) 10 MG capsule Take 1 capsule (10 mg total) by mouth daily. [employer] Auth #  [id number] Date Obtained 09/24/23 Take 1 capsule daily for 21 days then none for 7 days 09/24/23  Yes Jaci Standard, MD   levothyroxine (SYNTHROID, LEVOTHROID) 100 MCG tablet Take 100 mcg by mouth daily before breakfast. 12/29/15  Yes [provider]  losartan (COZAAR) 25 MG tablet Take 1 tablet (25 mg total) by mouth daily. 07/17/23  Yes Azucena Fallen, MD  metoprolol tartrate (LOPRESSOR) 50 MG tablet TAKE 1 TABLET BY MOUTH TWICE A DAY 09/19/23  Yes Yates Decamp, MD  Multiple Vitamins-Minerals (PRESERVISION AREDS) CAPS Take 1 capsule by mouth 2 (two) times daily.   Yes [provider]  polyethylene glycol (MIRALAX / GLYCOLAX) 17 g packet Take 17 g by mouth daily as needed for mild constipation.   Yes [provider]  pravastatin (PRAVACHOL) 40 MG tablet Take 40 mg by mouth every evening.   Yes [provider]  spironolactone (ALDACTONE) 25 MG tablet Take 1 tablet (25 mg total) by mouth daily. 07/17/23  Yes Azucena Fallen,  MD  ciprofloxacin (CILOXAN) 0.3 % ophthalmic solution Place 1 drop into the right eye in the morning, at noon, in the evening, and at bedtime. FOR 2 DAYS AFTER EACH MONTHLY EYE INJECTION Patient not taking: Reported on 10/01/2023 08/22/23   [provider]  [employer] VITAMIN B12 1000 MCG tablet TAKE 1 TABLET BY MOUTH EVERY DAY Patient not taking: Reported on 10/01/2023 02/15/23   [name] T, PA-C  KLOR-CON M10 10 MEQ tablet Take 10 mEq by mouth daily. Patient not taking: Reported on 10/01/2023 06/15/23   [provider]  predniSONE (DELTASONE) 50 MG tablet Take 50 mg by mouth daily. Patient not taking: Reported on 10/01/2023 09/26/23   [provider]    Inpatient Medications: Scheduled Meds:  [START ON 10/02/2023] acyclovir  400 mg Oral BID   [START ON 10/02/2023] amiodarone  100 mg Oral Daily   [START ON 10/02/2023] levothyroxine  100 mcg Oral Q0600   [START ON 10/02/2023] losartan  25 mg Oral Daily   metoprolol tartrate  50 mg Oral BID   potassium chloride  40 mEq Oral Once   pravastatin  40 mg Oral QPM   [START ON 10/02/2023]  spironolactone  25 mg Oral Daily   Continuous Infusions:  PRN Meds: acetaminophen **OR** acetaminophen, nitroGLYCERIN  Allergies:    Allergies  Allergen Reactions   Quinolones Other (See Comments)    Patient on Amiodarone and can prolong QT   Penicillins Other (See Comments)    UNSPECIFIED REACTION  Patient does not remember reaction.  Has patient had a PCN reaction causing immediate rash, facial/tongue/throat swelling, SOB or lightheadedness with hypotension: no Has patient had a PCN reaction causing severe rash involving mucus membranes or skin necrosis: no Has patient had a PCN reaction that required hospitalization no Has patient had a PCN reaction occurring within the last 10 years: no If all of the above answers are "NO", then may proceed with Cephalosporin use.    Sulfa Antibiotics Other (See Comments)    UNSPECIFIED REACTION  "maybe vision issues? 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  Tobacco Use   Smoking status: Never   Smokeless tobacco: Never  Vaping Use   Vaping status: Never Used  Substance and Sexual Activity   Alcohol use: No   Drug use: No   Sexual activity: Not Currently  Other Topics Concern   Not on file  Social History Narrative   Not on file   Social Determinants of Health   Financial Resource Strain: Low Risk  (11/12/2018)   Overall Financial Resource Strain (CARDIA)    Difficulty of Paying Living Expenses: Not very hard  Food Insecurity: No Food Insecurity (07/16/2023)   Hunger Vital Sign    Worried About Running Out of Food in the Last Year: Never true    Ran Out of Food in the Last Year: Never true  Transportation Needs: No  Transportation Needs (07/16/2023)   PRAPARE - Administrator, Civil Service (Medical): No    Lack of Transportation (Non-Medical): No  Physical Activity: Inactive (11/12/2018)   Exercise Vital Sign    Days of Exercise per Week: 0  days    Minutes of Exercise per Session: 0 min  Stress: Stress Concern Present (11/12/2018)   [employer] of Occupational Health - Occupational Stress Questionnaire    Feeling of Stress : To some extent  Social Connections: Not on file  Intimate Partner Violence: Not At Risk (07/16/2023)   Humiliation, Afraid, Rape, and Kick questionnaire    Fear of Current or Ex-Partner: No    Emotionally Abused: No    Physically Abused: No    Sexually Abused: No    Family History:    Family History  Problem Relation Age of Onset   Diabetes Mother    Stroke Mother        Carotid artery disease   Heart disease Father        CAD   Coronary artery disease Father    Diabetes Sister      ROS:  Review of Systems: [y] = yes, [ ]  = no      General: Weight gain [ ] ; Weight loss [ ] ; Anorexia [ ] ; Fatigue [ ] ; Fever [ ] ; Chills [ ] ; Weakness [ ]    Cardiac: Chest pain/pressure [ y]; Resting SOB [ ] ; Exertional SOB [ ] ; Orthopnea [ ] ; Pedal Edema [ ] ; Palpitations [ ] ; Syncope [ ] ; Presyncope [ y]; Paroxysmal nocturnal dyspnea [ ]    Pulmonary: Cough [ ] ; Wheezing [ ] ; Hemoptysis [ ] ; Sputum [ ] ; Snoring [ ]    GI: Vomiting [ ] ; Dysphagia [ ] ; Melena [ ] ; Hematochezia [ ] ; Heartburn [ ] ; Abdominal pain [ ] ; Constipation [ ] ; Diarrhea [ ] ; BRBPR [ ]    GU: Hematuria [ ] ; Dysuria [ ] ; Nocturia [ ]  Vascular: Pain in legs with walking [ ] ; Pain in feet with lying flat [ ] ; Non-healing sores [ ] ; Stroke [ ] ; TIA [ ] ; Slurred speech [ ] ;   Neuro: Headaches [ ] ; Vertigo [ ] ; Seizures [ ] ; Paresthesias [ ] ;Blurred vision [ ] ; Diplopia [ ] ; Vision changes [ ]    Ortho/Skin: Arthritis [ ] ; Joint pain [ ] ; Muscle pain [ ] ; Joint swelling [ ] ; Back Pain [ ] ; Rash [ ]    Psych:  Depression [ ] ; Anxiety [ ]    Heme: Bleeding problems [ ] ; Clotting disorders [ ] ; Anemia [ ]    Endocrine: Diabetes [ ] ; Thyroid dysfunction [ ]    Physical Exam/Data:   Vitals:   10/01/23 2130 10/01/23 2200 10/01/23 2230 10/01/23 2233  BP: (!) 135/53 (!) 129/53 (!) 142/65   Pulse: (!) 59 62 62   Resp: 16 (!) 22 18   Temp:    97.6 F (36.4 C)  TempSrc:    Oral  SpO2: 99% 97% 100%   Weight:      Height:       No intake or output data in the 24 hours ending 10/01/23 2243    10/01/2023   10:21 AM 09/06/2023   10:48 AM 09/05/2023   10:33 AM  Last 3 Weights  Weight (lbs) 148 lb 13 oz 150 lb 6.4 oz 149 lb  Weight (kg) 67.5 kg 68.221 kg 67.586 kg     Body mass index is 26.36 kg/m.  General:  Well nourished, well developed, in no acute distress HEENT: normal Lymph: no adenopathy Neck: no JVD Endocrine:  No thryomegaly Vascular: No carotid bruits; FA pulses 2+ bilaterally without bruits  Cardiac:  normal S1, S2; RRR; no murmur  Lungs:  clear to auscultation bilaterally, no wheezing, rhonchi or rales  Abd:  soft, nontender, no hepatomegaly  Ext: no edema Musculoskeletal:  No deformities, BUE and BLE strength normal and equal Skin: warm and dry  Neuro:  CNs 2-12 intact, no focal abnormalities noted Psych:  Normal affect   EKG:  The EKG was personally reviewed and demonstrates:  normal sinus rhythm, no pr or ST segment changes, rate 61 bpm Telemetry:  Telemetry was personally reviewed and demonstrates:  normal sinus rhythm with ventricular rates in the 50s-60s  Relevant CV Studies:  Coronary angiography 2019 (prior to TAVR): Normal coronary arteries, right dominant circulation.  Left ventriculogram not performed. Moderate pulmonary hypertension due to elevated LVEDP, WHO group 2. RA 7/7, mean 5 mmHg; RV 49/1, EDP 7 mmHg; PA 54/24, mean 36 mmHg, PA saturation 74%.  PW 21/26, mean 20 mmHg.  Aortic saturation 95%. CO 7.51, CI 4.05 by Fick. No indication for antiplatelet therapy  at this time.  TTE 04/16/23: 1. Left ventricular ejection fraction, by estimation, is 60 to 65%. The  left ventricle has normal function. The left ventricle has no regional  wall motion abnormalities. There is moderate concentric left ventricular  hypertrophy. Left ventricular  diastolic parameters are consistent with Grade II diastolic dysfunction  (pseudonormalization). Elevated left ventricular end-diastolic pressure.   2. Right ventricular systolic function is normal. The right ventricular  size is normal. There is mildly elevated pulmonary artery systolic  pressure. The estimated right ventricular systolic pressure is 41.4 mmHg.   3. Left atrial size was moderately dilated.   4. The mitral valve is normal in structure. Mild mitral valve  regurgitation.   5. The aortic valve has been repaired/replaced. Aortic valve  regurgitation is mild. No aortic stenosis is present. There is a 23 mm 3  THV size 23 mm Sapien prosthetic (TAVR) valve present in the aortic  position. Procedure Date: 09/03/2018.   6. The inferior vena cava is normal in size with <50% respiratory  variability, suggesting right atrial pressure of 8 mmHg.    Laboratory Data:  High Sensitivity Troponin:   Recent Labs  Lab 10/01/23 1050 10/01/23 1250 10/01/23 1735  TROPONINIHS 47* 67* 85*     Chemistry Recent Labs  Lab 10/01/23 1050  NA 135  K 3.2*  CL 100  CO2 24  GLUCOSE 92  BUN 33*  CREATININE 1.06*  CALCIUM 8.6*  GFRNONAA 51*  ANIONGAP 11    No results for input(s): "PROT", "ALBUMIN", "AST", "ALT", "ALKPHOS", "BILITOT" in the last 168 hours. Hematology Recent Labs  Lab 10/01/23 1050  WBC 6.5  RBC 4.28  HGB 13.5  HCT 41.7  MCV 97.4  MCH 31.5  MCHC 32.4  RDW 16.3*  PLT 153   BNPNo results for input(s): "BNP", "PROBNP" in the last 168 hours.  DDimer No results for input(s): "DDIMER" in the last 168 hours.  Radiology/Studies:  DG Chest 2 View  Result Date: 10/01/2023 CLINICAL DATA:   Chest pain. EXAM: CHEST - 2 VIEW COMPARISON:  07/16/2023. FINDINGS: Trachea is midline. Heart is enlarged, stable. Aortic valve replacement. Thoracic aorta is calcified. Minimal streaky scarring in the lingula. Lungs are otherwise clear. No pleural fluid. Thoracic vertebral body augmentations. IMPRESSION: No acute findings. Electronically Signed   By: Leanna Battles M.D.   On: 10/01/2023 15:54      TIMI Risk Score for Unstable Angina or Non-ST Elevation MI:   The patient's TIMI risk score is 5, which indicates a 26% risk of all cause mortality, new or recurrent myocardial infarction or need for urgent revascularization in  the next 14 days.   Assessment and Plan:   Cardiac Chest Pain (Typical) NSTEMI (Type 1 vs 2, tbd) Presenting after episode of dizziness and chest pain at home. Trop 47 -> 67 -> 85. No ECG changes. CP initially resolved, but then recurred after many hours in the ED while her troponins have continued to rise. She had a cath in 2019 which showed normal coronaries, which raise suspicion for possibly vasospastic angina. It is also certanily possible she has a type 1 event with small burden of plaque that did rupture, but the paroxysmal nature of the CP suggests vasospasm more. Thus, plan for now -nitroglycerin tab now for CP -Start amlodipine 2.5mg  daily for now for empiric treatment of vasospastic angina -heparin gtt for now, hold home apixaban, trend one additional troponin -Any further testing if necessary pending clinical course and if CP improves with nitroglycerine and a CCB -Continue home metoprolol, spironolactone, losartan, amiodarone, and pravastatin     For questions or updates, please contact [facility] Please consult [email or web address] for contact info under     Signed, Freddy Finner, MD  10/01/2023 10:43 PM

## 2023-10-01 NOTE — ED Notes (Signed)
Patient was assisted to the bathroom using a wheelchair. Patient is now resting in bed. Patient was given something to eat and drink. Denies chest pain at this time.

## 2023-10-01 NOTE — ED Provider Triage Note (Signed)
Emergency Medicine Provider Triage Evaluation Note  [name] , a 89 y.o. female  was evaluated in triage.  Pt complains of chest pain.  Patient was doing her normal activities this morning and suddenly felt like her chest was uncomfortable.  She felt her heart rate was elevated.  Was picked up by EMS and en route her heart rate normalized and she now feels better.  She still feels some chest discomfort.  History of heart failure and atrial fibrillation.  Review of Systems  Positive: Chest pain palpitations Negative: Back pain abdominal pain  Physical Exam  BP 109/86   Pulse 75   Temp 98.6 F (37 C) (Oral)   Resp 15   Ht 5\' 3"  (1.6 m)   Wt 67.5 kg   SpO2 98%   BMI 26.36 kg/m  Gen:   Awake, no distress   Resp:  Normal effort  MSK:   Moves extremities without difficulty  Other:  No murmurs rubs or gallops normal rate regular  Medical Decision Making  Medically screening exam initiated at 10:29 AM.  Appropriate orders placed.  [name] was informed that the remainder of the evaluation will be completed by another provider, this initial triage assessment does not replace that evaluation, and the importance of remaining in the ED until their evaluation is complete.  I think the patient likely had symptomatic atrial fibrillation based on history.  Unfortunately she is still having chest discomfort despite going back into normal sinus rhythm en route.  Will obtain a chest pain workup.     Melene Plan, DO 10/01/23 1031

## 2023-10-02 ENCOUNTER — Other Ambulatory Visit (HOSPITAL_COMMUNITY): Payer: Self-pay

## 2023-10-02 ENCOUNTER — Ambulatory Visit: Payer: Medicare Other

## 2023-10-02 DIAGNOSIS — I480 Paroxysmal atrial fibrillation: Secondary | ICD-10-CM | POA: Diagnosis not present

## 2023-10-02 DIAGNOSIS — R079 Chest pain, unspecified: Secondary | ICD-10-CM | POA: Diagnosis not present

## 2023-10-02 DIAGNOSIS — I4892 Unspecified atrial flutter: Secondary | ICD-10-CM

## 2023-10-02 DIAGNOSIS — Z8679 Personal history of other diseases of the circulatory system: Secondary | ICD-10-CM | POA: Diagnosis not present

## 2023-10-02 DIAGNOSIS — I10 Essential (primary) hypertension: Secondary | ICD-10-CM

## 2023-10-02 DIAGNOSIS — E876 Hypokalemia: Secondary | ICD-10-CM

## 2023-10-02 DIAGNOSIS — I4891 Unspecified atrial fibrillation: Secondary | ICD-10-CM

## 2023-10-02 DIAGNOSIS — I5032 Chronic diastolic (congestive) heart failure: Secondary | ICD-10-CM

## 2023-10-02 DIAGNOSIS — Z7901 Long term (current) use of anticoagulants: Secondary | ICD-10-CM

## 2023-10-02 DIAGNOSIS — C9000 Multiple myeloma not having achieved remission: Secondary | ICD-10-CM | POA: Diagnosis not present

## 2023-10-02 LAB — CBC
HCT: 38.1 % (ref 36.0–46.0)
Hemoglobin: 12.4 g/dL (ref 12.0–15.0)
MCH: 31.1 pg (ref 26.0–34.0)
MCHC: 32.5 g/dL (ref 30.0–36.0)
MCV: 95.5 fL (ref 80.0–100.0)
Platelets: 143 10*3/uL — ABNORMAL LOW (ref 150–400)
RBC: 3.99 MIL/uL (ref 3.87–5.11)
RDW: 16.2 % — ABNORMAL HIGH (ref 11.5–15.5)
WBC: 5.1 10*3/uL (ref 4.0–10.5)
nRBC: 0 % (ref 0.0–0.2)

## 2023-10-02 LAB — BASIC METABOLIC PANEL
Anion gap: 7 (ref 5–15)
BUN: 32 mg/dL — ABNORMAL HIGH (ref 8–23)
CO2: 27 mmol/L (ref 22–32)
Calcium: 8.2 mg/dL — ABNORMAL LOW (ref 8.9–10.3)
Chloride: 102 mmol/L (ref 98–111)
Creatinine, Ser: 0.89 mg/dL (ref 0.44–1.00)
GFR, Estimated: >60 mL/min (ref >60)
Glucose, Bld: 82 mg/dL (ref 70–99)
Potassium: 3.7 mmol/L (ref 3.5–5.1)
Sodium: 136 mmol/L (ref 135–145)

## 2023-10-02 LAB — TROPONIN I (HIGH SENSITIVITY)
Troponin I (High Sensitivity): 86 ng/L — ABNORMAL HIGH (ref <18)
Troponin I (High Sensitivity): 96 ng/L — ABNORMAL HIGH (ref <18)

## 2023-10-02 LAB — APTT: aPTT: 61 s — ABNORMAL HIGH (ref 24–36)

## 2023-10-02 LAB — MAGNESIUM: Magnesium: 1.9 mg/dL (ref 1.7–2.4)

## 2023-10-02 LAB — HEPARIN LEVEL (UNFRACTIONATED): Heparin Unfractionated: >1.1 [IU]/mL — ABNORMAL HIGH (ref 0.30–0.70)

## 2023-10-02 LAB — TSH: TSH: 1.565 u[IU]/mL (ref 0.350–4.500)

## 2023-10-02 MED ORDER — NITROGLYCERIN 0.4 MG SL SUBL: 0.4000 mg | SUBLINGUAL_TABLET | SUBLINGUAL | Status: DC | PRN

## 2023-10-02 MED ORDER — MAGNESIUM SULFATE 2 GM/50ML IV SOLN
2.0000 g | Freq: Once | INTRAVENOUS | Status: AC
  Administered 2023-10-02: 2 g via INTRAVENOUS
  Filled 2023-10-02: qty 50

## 2023-10-02 MED ORDER — NITROGLYCERIN 0.4 MG SL SUBL
SUBLINGUAL_TABLET | SUBLINGUAL | Status: AC
  Administered 2023-10-02: 0.4 mg via SUBLINGUAL
  Filled 2023-10-02: qty 1

## 2023-10-02 MED ORDER — TRAMADOL HCL 50 MG PO TABS
50.0000 mg | ORAL_TABLET | Freq: Three times a day (TID) | ORAL | Status: DC
  Administered 2023-10-02: 50 mg via ORAL
  Filled 2023-10-02: qty 1

## 2023-10-02 MED ORDER — APIXABAN 5 MG PO TABS
5.0000 mg | ORAL_TABLET | Freq: Two times a day (BID) | ORAL | Status: DC
  Administered 2023-10-02: 5 mg via ORAL
  Filled 2023-10-02: qty 1

## 2023-10-02 MED ORDER — AMLODIPINE BESYLATE 5 MG PO TABS
2.5000 mg | ORAL_TABLET | Freq: Every day | ORAL | Status: DC
  Administered 2023-10-02: 2.5 mg via ORAL
  Filled 2023-10-02: qty 1

## 2023-10-02 MED ORDER — AMLODIPINE BESYLATE 2.5 MG PO TABS
2.5000 mg | ORAL_TABLET | Freq: Every day | ORAL | 1 refills | Status: DC
  Filled 2023-10-02: qty 30, 30d supply, fill #0

## 2023-10-02 MED ORDER — POLYETHYLENE GLYCOL 3350 17 G PO PACK: 17.0000 g | PACK | Freq: Every day | ORAL | Status: DC | PRN

## 2023-10-02 MED ORDER — IBUPROFEN 400 MG PO TABS: 400.0000 mg | ORAL_TABLET | Freq: Three times a day (TID) | ORAL | Status: AC

## 2023-10-02 MED ORDER — POTASSIUM CHLORIDE CRYS ER 10 MEQ PO TBCR
20.0000 meq | EXTENDED_RELEASE_TABLET | Freq: Once | ORAL | Status: AC
Start: 2023-10-02 — End: 2023-10-02
  Administered 2023-10-02: 20 meq via ORAL
  Filled 2023-10-02: qty 2

## 2023-10-02 MED ORDER — NITROGLYCERIN 0.4 MG SL SUBL: 0.4000 mg | SUBLINGUAL_TABLET | Freq: Once | SUBLINGUAL | Status: AC

## 2023-10-02 MED ORDER — PANTOPRAZOLE SODIUM 40 MG PO TBEC
40.0000 mg | DELAYED_RELEASE_TABLET | Freq: Every day | ORAL | 0 refills | Status: DC
  Filled 2023-10-02: qty 30, 30d supply, fill #0

## 2023-10-02 MED ORDER — IBUPROFEN 400 MG PO TABS
400.0000 mg | ORAL_TABLET | Freq: Three times a day (TID) | ORAL | Status: DC
  Administered 2023-10-02: 400 mg via ORAL
  Filled 2023-10-02: qty 1

## 2023-10-02 MED ORDER — PANTOPRAZOLE SODIUM 40 MG PO TBEC
40.0000 mg | DELAYED_RELEASE_TABLET | Freq: Every day | ORAL | Status: DC
  Administered 2023-10-02: 40 mg via ORAL
  Filled 2023-10-02: qty 1

## 2023-10-02 MED ORDER — LOSARTAN POTASSIUM 50 MG PO TABS
25.0000 mg | ORAL_TABLET | Freq: Every day | ORAL | Status: DC
Start: 2023-10-02 — End: 2023-10-02
  Administered 2023-10-02: 25 mg via ORAL
  Filled 2023-10-02: qty 1

## 2023-10-02 MED ORDER — VITAMIN D 25 MCG (1000 UNIT) PO TABS
2000.0000 [IU] | ORAL_TABLET | Freq: Every day | ORAL | Status: DC
  Administered 2023-10-02: 2000 [IU] via ORAL
  Filled 2023-10-02: qty 2

## 2023-10-02 MED ORDER — SPIRONOLACTONE 12.5 MG HALF TABLET
25.0000 mg | ORAL_TABLET | Freq: Every day | ORAL | Status: DC
  Administered 2023-10-02: 25 mg via ORAL
  Filled 2023-10-02: qty 2

## 2023-10-02 NOTE — ED Notes (Signed)
Patient was seen by [facility], Dr Lorayne Bender for chest pain.

## 2023-10-02 NOTE — ED Notes (Signed)
ED TO INPATIENT HANDOFF REPORT  ED Nurse Name and Phone #: Theophilus Bones [phone]  S Name/Age/Gender Zoe [name] 89 y.o. female Room/Bed: 029C/029C  Code Status   Code Status: Full Code  Home/SNF/Other Home Patient oriented to: self, place, time, and situation Is this baseline? Yes   Triage Complete: Triage complete  Chief Complaint Chest pain [R07.9]  Triage Note Chest pain that started today with dizziness. Hx of afib. Alert and oriented x 4. Took 324mg  Aspirin at home.   EMS VS:  BP 158/64 74 98% on RA   Allergies Allergies  Allergen Reactions   Quinolones Other (See Comments)    Patient on Amiodarone and can prolong QT   Penicillins Other (See Comments)    UNSPECIFIED REACTION  Patient does not remember reaction.  Has patient had a PCN reaction causing immediate rash, facial/tongue/throat swelling, SOB or lightheadedness with hypotension: no Has patient had a PCN reaction causing severe rash involving mucus membranes or skin necrosis: no Has patient had a PCN reaction that required hospitalization no Has patient had a PCN reaction occurring within the last 10 years: no If all of the above answers are "NO", then may proceed with Cephalosporin use.    Sulfa Antibiotics Other (See Comments)    UNSPECIFIED REACTION  "maybe vision issues? "    Level of Care/Admitting Diagnosis ED Disposition     ED Disposition  Admit   Condition  --   Comment  Hospital Area: [facility] [[id number]]  Level of Care: Progressive [102]  Admit to Progressive based on following criteria: CARDIOVASCULAR & THORACIC of moderate stability with acute coronary syndrome symptoms/low risk myocardial infarction/hypertensive urgency/arrhythmias/heart failure potentially compromising stability and stable post cardiovascular intervention patients.  May place patient in observation at [facility] or [facility] if equivalent level of care is available:: Yes  Covid Evaluation:  Asymptomatic - no recent exposure (last 10 days) testing not required  Diagnosis: Chest pain [[id number]]  Admitting Physician: John Giovanni [[id number]]  Attending Physician: John Giovanni [[id number]]          B Medical/Surgery History Past Medical History:  Diagnosis Date   Arthritis    some - per patient   Breast cancer (HCC)    breast cancer / left    Cataract    bilat    GERD (gastroesophageal reflux disease)    History of kidney stones    Hyperlipidemia    Hypertension    Hypothyroidism    Macular degeneration    Left   S/P TAVR (transcatheter aortic valve replacement) 09/03/2018   23 mm Edwards Sapien 3 transcatheter heart valve placed via percutaneous right transfemoral approach    Severe aortic stenosis    Stress incontinence    Thyroid disease    Tinnitus    Past Surgical History:  Procedure Laterality Date   ABDOMINAL HYSTERECTOMY  1970's   BACK SURGERY     BREAST LUMPECTOMY  12/1998   lumpectomy   CARDIAC CATHETERIZATION     CARDIOVERSION N/A 04/18/2023   Procedure: CARDIOVERSION;  Surgeon: Tessa Lerner, DO;  Location: [facility] INVASIVE CV LAB;  Service: Cardiovascular;  Laterality: N/A;   CARDIOVERSION N/A 04/20/2023   Procedure: CARDIOVERSION;  Surgeon: Yates Decamp, MD;  Location: [facility] INVASIVE CV LAB;  Service: Cardiovascular;  Laterality: N/A;   EYE SURGERY     cataract surgery bilat    INTRAOPERATIVE TRANSTHORACIC ECHOCARDIOGRAM N/A 09/03/2018   Procedure: INTRAOPERATIVE TRANSTHORACIC ECHOCARDIOGRAM;  Surgeon: Kathleene Hazel, MD;  Location: [facility] OR;  Service: Open Heart Surgery;  Laterality: N/A;   KYPHOPLASTY N/A 09/07/2022   Procedure: THORACIC EIGHT KYPHOPLASTY;  Surgeon: Venita Lick, MD;  Location: [facility] OR;  Service: Orthopedics;  Laterality: N/A;  1 hr Local with [facility] 3 C-Bed   LITHOTRIPSY     Right total knee     2018 Dr. Charlann Boxer   RIGHT/LEFT HEART CATH AND CORONARY ANGIOGRAPHY N/A 08/06/2018   Procedure: RIGHT/LEFT HEART CATH AND  CORONARY ANGIOGRAPHY;  Surgeon: Yates Decamp, MD;  Location: [facility] INVASIVE CV LAB;  Service: Cardiovascular;  Laterality: N/A;   THYROIDECTOMY, PARTIAL  1975   TONSILLECTOMY     as a child - patient not sure of exact date   TOTAL KNEE ARTHROPLASTY Left 03/13/2016   Procedure: TOTAL KNEE ARTHROPLASTY;  Surgeon: Durene Romans, MD;  Location: [facility] ORS;  Service: Orthopedics;  Laterality: Left;   TOTAL KNEE ARTHROPLASTY Right 06/18/2017   Procedure: RIGHT TOTAL KNEE ARTHROPLASTY;  Surgeon: Durene Romans, MD;  Location: [facility] ORS;  Service: Orthopedics;  Laterality: Right;   TRANSCATHETER AORTIC VALVE REPLACEMENT, TRANSFEMORAL N/A 09/03/2018   Procedure: TRANSCATHETER AORTIC VALVE REPLACEMENT, TRANSFEMORAL;  Surgeon: Kathleene Hazel, MD;  Location: [facility] OR;  Service: Open Heart Surgery;  Laterality: N/A;     A IV Location/Drains/Wounds Patient Lines/Drains/Airways Status     Active Line/Drains/Airways     Name Placement date Placement time Site Days   Peripheral IV 10/01/23 20 G 1" Anterior;Right Forearm 10/01/23  1712  Forearm  1            Intake/Output Last 24 hours No intake or output data in the 24 hours ending 10/02/23 0817  Labs/Imaging Results for orders placed or performed during the hospital encounter of 10/01/23 (from the past 48 hour(s))  Basic metabolic panel     Status: Abnormal   Collection Time: 10/01/23 10:50 AM  Result Value Ref Range   Sodium 135 135 - 145 mmol/L   Potassium 3.2 (L) 3.5 - 5.1 mmol/L   Chloride 100 98 - 111 mmol/L   CO2 24 22 - 32 mmol/L   Glucose, Bld 92 70 - 99 mg/dL    Comment: Glucose reference range applies only to samples taken after fasting for at least 8 hours.   BUN 33 (H) 8 - 23 mg/dL   Creatinine, Ser 5.62 (H) 0.44 - 1.00 mg/dL   Calcium 8.6 (L) 8.9 - 10.3 mg/dL   GFR, Estimated 51 (L) >60 mL/min    Comment: (NOTE) Calculated using the CKD-EPI Creatinine Equation (2021)    Anion gap 11 5 - 15    Comment: Performed at [facility], [address], [place], [place] [address]  Troponin I (High Sensitivity)     Status: Abnormal   Collection Time: 10/01/23 10:50 AM  Result Value Ref Range   Troponin I (High Sensitivity) 47 (H) <18 ng/L    Comment: (NOTE) Elevated high sensitivity troponin I (hsTnI) values and significant  changes across serial measurements may suggest ACS but many other  chronic and acute conditions are known to elevate hsTnI results.  Refer to the "Links" section for chest pain algorithms and additional  guidance. Performed at [facility], [address] [place], [place] [address]   CBC     Status: Abnormal   Collection Time: 10/01/23 10:50 AM  Result Value Ref Range   WBC 6.5 4.0 - 10.5 K/uL   RBC 4.28 3.87 - 5.11 MIL/uL   Hemoglobin 13.5 12.0 - 15.0 g/dL  HCT 41.7 36.0 - 46.0 %   MCV 97.4 80.0 - 100.0 fL   MCH 31.5 26.0 - 34.0 pg   MCHC 32.4 30.0 - 36.0 g/dL   RDW 32.9 (H) 51.8 - 84.1 %   Platelets 153 150 - 400 K/uL   nRBC 0.0 0.0 - 0.2 %    Comment: Performed at [name], [address], [place], [place] [address]  Troponin I (High Sensitivity)     Status: Abnormal   Collection Time: 10/01/23 12:50 PM  Result Value Ref Range   Troponin I (High Sensitivity) 67 (H) <18 ng/L    Comment: RESULT CALLED TO, READ BACK BY AND VERIFIED WITH Cerina Leary RN @ 1514 10/01/23 LEONARD,A (NOTE) Elevated high sensitivity troponin I (hsTnI) values and significant  changes across serial measurements may suggest ACS but many other  chronic and acute conditions are known to elevate hsTnI results.  Refer to the "Links" section for chest pain algorithms and additional  guidance. Performed at [facility], [address] [place], [place] [address]   Troponin I (High Sensitivity)     Status: Abnormal   Collection Time: 10/01/23  5:35 PM  Result Value Ref Range   Troponin I (High Sensitivity) 85 (H) <18 ng/L    Comment: (NOTE) Elevated high sensitivity troponin I (hsTnI)  values and significant  changes across serial measurements may suggest ACS but many other  chronic and acute conditions are known to elevate hsTnI results.  Refer to the "Links" section for chest pain algorithms and additional  guidance. Performed at [facility], [address], [place], [place] [address]   TSH     Status: None   Collection Time: 10/01/23 11:58 PM  Result Value Ref Range   TSH 1.565 0.350 - 4.500 uIU/mL    Comment: Performed by a 3rd Generation assay with a functional sensitivity of <=0.01 uIU/mL. Performed at [facility], [address] [place], [place] [address]   Troponin I (High Sensitivity)     Status: Abnormal   Collection Time: 10/01/23 11:58 PM  Result Value Ref Range   Troponin I (High Sensitivity) 96 (H) <18 ng/L    Comment: (NOTE) Elevated high sensitivity troponin I (hsTnI) values and significant  changes across serial measurements may suggest ACS but many other  chronic and acute conditions are known to elevate hsTnI results.  Refer to the "Links" section for chest pain algorithms and additional  guidance. Performed at [facility], [address] [place], [place] [address]   Magnesium     Status: None   Collection Time: 10/01/23 11:58 PM  Result Value Ref Range   Magnesium 1.9 1.7 - 2.4 mg/dL    Comment: Performed at [facility], [address], [place], [place] [address]  Basic metabolic panel     Status: Abnormal   Collection Time: 10/02/23  2:49 AM  Result Value Ref Range   Sodium 136 135 - 145 mmol/L   Potassium 3.7 3.5 - 5.1 mmol/L   Chloride 102 98 - 111 mmol/L   CO2 27 22 - 32 mmol/L   Glucose, Bld 82 70 - 99 mg/dL    Comment: Glucose reference range applies only to samples taken after fasting for at least 8 hours.   BUN 32 (H) 8 - 23 mg/dL   Creatinine, Ser 3.76 0.44 - 1.00 mg/dL   Calcium 8.2 (L) 8.9 - 10.3 mg/dL   GFR, Estimated >28 >31  mL/min    Comment: (NOTE) Calculated using the CKD-EPI Creatinine  Equation (2021)    Anion gap 7 5 - 15    Comment: Performed at [facility], [address] [place], [place] [address]  Troponin I (High Sensitivity)     Status: Abnormal   Collection Time: 10/02/23  2:49 AM  Result Value Ref Range   Troponin I (High Sensitivity) 86 (H) <18 ng/L    Comment: (NOTE) Elevated high sensitivity troponin I (hsTnI) values and significant  changes across serial measurements may suggest ACS but many other  chronic and acute conditions are known to elevate hsTnI results.  Refer to the "Links" section for chest pain algorithms and additional  guidance. Performed at [facility], [address], [place], [place] [address]   Heparin level (unfractionated)     Status: Abnormal   Collection Time: 10/02/23  7:07 AM  Result Value Ref Range   Heparin Unfractionated >1.10 (H) 0.30 - 0.70 IU/mL    Comment: (NOTE) The clinical reportable range upper limit is being lowered to >1.10 to align with the FDA approved guidance for the current laboratory assay.  If heparin results are below expected values, and patient dosage has  been confirmed, suggest follow up testing of antithrombin III levels. Performed at [facility], [address], [place], [place] [address]   APTT     Status: Abnormal   Collection Time: 10/02/23  7:07 AM  Result Value Ref Range   aPTT 61 (H) 24 - 36 seconds    Comment:        IF BASELINE aPTT IS ELEVATED, SUGGEST PATIENT RISK ASSESSMENT BE USED TO DETERMINE APPROPRIATE ANTICOAGULANT THERAPY. Performed at [facility], [address], [place], [place] [address]    DG Chest 2 View  Result Date: 10/01/2023 CLINICAL DATA:  Chest pain. EXAM: CHEST - 2 VIEW COMPARISON:  07/16/2023. FINDINGS: Trachea is midline. Heart is enlarged, stable. Aortic valve replacement. Thoracic aorta is calcified. Minimal streaky scarring in the lingula. Lungs are otherwise clear. No pleural fluid. Thoracic vertebral body augmentations.  IMPRESSION: No acute findings. Electronically Signed   By: Leanna Battles M.D.   On: 10/01/2023 15:54    Pending Labs Unresulted Labs (From admission, onward)     Start     Ordered   10/03/23 0500  APTT  Daily,   R      10/01/23 2254   10/03/23 0500  Heparin level (unfractionated)  Daily,   R      10/01/23 2254   10/02/23 0756  CBC  Daily,   R      10/02/23 0755            Vitals/Pain Today's Vitals   10/02/23 0500 10/02/23 0529 10/02/23 0600 10/02/23 0700  BP: (!) 178/71 (!) 178/71 (!) 174/64 (!) 183/90  Pulse: 69  60 (!) 57  Resp: 16  (!) 22 10  Temp:      TempSrc:      SpO2: 99%  94% 98%  Weight:      Height:      PainSc:        Isolation Precautions No active isolations  Medications Medications  acyclovir (ZOVIRAX) tablet 400 mg (has no administration in time range)  amiodarone (PACERONE) tablet 100 mg (has no administration in time range)  metoprolol tartrate (LOPRESSOR) tablet 50 mg (50 mg Oral Not Given 10/01/23 2352)  pravastatin (PRAVACHOL) tablet 40 mg (40 mg Oral Given  10/01/23 2347)  levothyroxine (SYNTHROID) tablet 100 mcg (100 mcg Oral Given 10/02/23 0526)  acetaminophen (TYLENOL) tablet 650 mg (650 mg Oral Given 10/02/23 0301)    Or  acetaminophen (TYLENOL) suppository 650 mg ( Rectal See Alternative 10/02/23 0301)  heparin ADULT infusion 100 units/mL (25000 units/253mL) (800 Units/hr Intravenous New Bag/Given 10/02/23 0001)  amLODipine (NORVASC) tablet 2.5 mg (2.5 mg Oral Given 10/02/23 0529)  losartan (COZAAR) tablet 25 mg (25 mg Oral Given 10/02/23 0526)  spironolactone (ALDACTONE) tablet 25 mg (25 mg Oral Given 10/02/23 0526)  nitroGLYCERIN (NITROSTAT) SL tablet 0.4 mg (has no administration in time range)  magnesium sulfate IVPB 2 g 50 mL (has no administration in time range)  potassium chloride SA (KLOR-CON M) CR tablet 20 mEq (has no administration in time range)  Vitamin D3 2,000 Units (has no administration in time range)  polyethylene  glycol (MIRALAX / GLYCOLAX) packet 17 g (has no administration in time range)  potassium chloride SA (KLOR-CON M) CR tablet 40 mEq (40 mEq Oral Given 10/01/23 2347)  nitroGLYCERIN (NITROSTAT) SL tablet 0.4 mg (0.4 mg Sublingual Given 10/01/23 2352)  nitroGLYCERIN (NITROSTAT) SL tablet 0.4 mg (0.4 mg Sublingual Given 10/02/23 0303)    Mobility walks     Focused Assessments Cardiac Assessment Handoff:  Cardiac Rhythm: Atrial fibrillation Lab Results  Component Value Date   CKTOTAL 15 (L) 04/15/2023   CKMB 1.6 04/02/2011   TROPONINI <0.30 04/02/2011   Lab Results  Component Value Date   DDIMER <0.27 02/17/2023   Does the Patient currently have chest pain? No    R Recommendations: See Admitting Provider Note  Report given to:   Additional Notes:

## 2023-10-02 NOTE — Progress Notes (Addendum)
ANTICOAGULATION CONSULT NOTE  Pharmacy Consult for Heparin Indication: chest pain/ACS and atrial fibrillation  Allergies  Allergen Reactions   Quinolones Other (See Comments)    Patient on Amiodarone and can prolong QT   Penicillins Other (See Comments)    UNSPECIFIED REACTION  Patient does not remember reaction.  Has patient had a PCN reaction causing immediate rash, facial/tongue/throat swelling, SOB or lightheadedness with hypotension: no Has patient had a PCN reaction causing severe rash involving mucus membranes or skin necrosis: no Has patient had a PCN reaction that required hospitalization no Has patient had a PCN reaction occurring within the last 10 years: no If all of the above answers are "NO", then may proceed with Cephalosporin use.    Sulfa Antibiotics Other (See Comments)    UNSPECIFIED REACTION  "maybe vision issues? "    Patient Measurements: Height: 5\' 3"  (160 cm) Weight: 67.5 kg (148 lb 13 oz) IBW/kg (Calculated) : 52.4 Heparin Dosing Weight: 66 kg  Vital Signs: Temp: 97.6 F (36.4 C) (12/09 2233) Temp Source: Oral (12/09 2233) BP: 183/90 (12/10 0700) Pulse Rate: 57 (12/10 0700)  Labs: Recent Labs    10/01/23 1050 10/01/23 1250 10/01/23 1735 10/01/23 2358 10/02/23 0249 10/02/23 0707  HGB 13.5  --   --   --   --   --   HCT 41.7  --   --   --   --   --   PLT 153  --   --   --   --   --   APTT  --   --   --   --   --  61*  HEPARINUNFRC  --   --   --   --   --  >1.10*  CREATININE 1.06*  --   --   --  0.89  --   TROPONINIHS 47*   < > 85* 96* 86*  --    < > = values in this interval not displayed.    Estimated Creatinine Clearance: 41.1 mL/min (by C-G formula based on SCr of 0.89 mg/dL).   Medical History: Past Medical History:  Diagnosis Date   Arthritis    some - per patient   Breast cancer (HCC)    breast cancer / left    Cataract    bilat    GERD (gastroesophageal reflux disease)    History of kidney stones    Hyperlipidemia     Hypertension    Hypothyroidism    Macular degeneration    Left   S/P TAVR (transcatheter aortic valve replacement) 09/03/2018   23 mm Edwards Sapien 3 transcatheter heart valve placed via percutaneous right transfemoral approach    Severe aortic stenosis    Stress incontinence    Thyroid disease    Tinnitus     Medications:  (Not in a hospital admission)  Scheduled:   acyclovir  400 mg Oral BID   amiodarone  100 mg Oral Daily   amLODipine  2.5 mg Oral Daily   levothyroxine  100 mcg Oral Q0600   losartan  25 mg Oral Daily   metoprolol tartrate  50 mg Oral BID   pravastatin  40 mg Oral QPM   spironolactone  25 mg Oral Daily   Infusions:   heparin 800 Units/hr (10/02/23 0001)   PRN: acetaminophen **OR** acetaminophen, nitroGLYCERIN  Assessment: 31 yof with a history of HTN, HLD, hypothyroidism, AF on eliquis, severe aortic stenosis s/p TAVR in 2019, bilateral carotid artery stenosis, HF, IgG  lambda multiple myeloma, GERD, CKD, CVA, hx of breast cancer. Patient is presenting with chest pain. Heparin per pharmacy consult placed for chest pain/ACS and atrial fibrillation.  Patient is on apixaban prior to arrival. Last dose 12/9 ~1000. Will require aPTT monitoring due to likely falsely high anti-Xa level secondary to DOAC use.  12/10 AM: Initial aPTT 61 sec below goal (no bolus, on 800 units/hr), heparin level >1.10 impacted by recent DOAC use.  CBC stable.   Goal of Therapy:  Heparin level 0.3-0.7 units/ml aPTT 66-102 seconds Monitor platelets by anticoagulation protocol: Yes   Plan:  Increase heparin infusion to 950units/hr Check aPTT in 8 hours and HL/aPTT daily while on heparin Continue to monitor via aPTT until levels are correlated Continue to monitor H&H and platelets  AM update: transitioning to PTA Eliquis per cardiology with no plans for intervention.  Trixie Rude, PharmD Clinical Pharmacist 10/02/2023  8:26 AM

## 2023-10-02 NOTE — ED Notes (Signed)
Patient reports that her chest pain has returned. Rates pain 6/10.

## 2023-10-02 NOTE — ED Notes (Signed)
Patient was assisted to the bathroom. Patient is now resting in bed. Call bell is within reach.

## 2023-10-02 NOTE — Discharge Summary (Signed)
Physician Discharge Summary  Zoe [name] WGN:[id number] DOB: April 24, 1934 DOA: 10/01/2023  PCP: Irena Reichmann, DO  Admit date: 10/01/2023 Discharge date: 10/02/2023  Time spent: 55 minutes  Recommendations for Outpatient Follow-up:  Follow-up with Irena Reichmann, DO in 2 weeks.  On follow-up patient's musculoskeletal chest pain will need to be reassessed on follow-up.  Patient need a basic metabolic profile, magnesium done to follow-up on electrolytes and renal function. Follow-up with Micah Flesher, PA cardiology on 10/23/2023.   Discharge Diagnoses:  Principal Problem:   Chest pain Active Problems:   Hypertension   Chronic heart failure with preserved ejection fraction (HFpEF) (HCC)   Hypokalemia   Multiple myeloma (HCC)   Paroxysmal atrial fibrillation with rapid ventricular response (HCC)   Vasospastic angina (HCC)   History of coronary vasospasm   NSTEMI (non-ST elevated myocardial infarction) [facility])   Discharge Condition: Stable and improved.  Diet recommendation: Heart healthy  Filed Weights   10/01/23 1021  Weight: 67.5 kg    History of present illness:  HPI per Dr. Darl Householder is a 89 y.o. female with medical history significant of hypertension, hyperlipidemia, hypothyroidism, paroxysmal A-fib on Eliquis, possible mild coronary spasm by coronary angiogram in 2014, severe aortic stenosis status post TAVR in 2019, asymptomatic bilateral carotid artery stenosis, chronic HFpEF, IgG lambda multiple myeloma, GERD, CKD stage IIIa, peripheral neuropathy, history of breast cancer status post lumpectomy in 2000, CVA presenting to the ED with complaints of chest pain, palpitations, and dizziness.  On EMS arrival, she was noted to be in A-fib with RVR with heart rate ranging from 115-135.  They had the patient sit up and bear down and she subsequently converted to normal sinus rhythm.  Vital signs on arrival to the ED: Temperature 98.6 F, pulse 75, respiratory rate  15, blood pressure 109/86, and SpO2 98% on room air.  Labs notable for WBC count 6.5, hemoglobin 13.5, potassium 3.2, creatinine 1.0, troponin 47> 67> 85.  EKG showing normal sinus rhythm and no acute ischemic changes.  Chest x-ray showing no acute findings. EDP consulted on-call cardiologist Dr. Hulan Saas who recommended holding off starting IV heparin at this time.  Cardiology will evaluate the patient and give further recommendations.  Sublingual nitroglycerin ordered.  TRH called to admit.   Patient states this morning around 9 AM she was feeling dizzy and started having substernal chest pain.  When EMS arrived, she was told that she was in A-fib and her heart rate was ranging between 115-135.  States when EMS moved her to the stretcher, she was told that she was no longer in A-fib and that her heart rate was now normal.  Subsequently her symptoms improved.  Dizziness has resolved but still having some mild chest discomfort.  No shortness of breath or fevers.  Reports chronic cough which he attributes to seasonal allergies/postnasal drip.  She denies history of exertional chest pain in the past.  Denies history of blood clots.  She is compliant with Eliquis.  No other complaints.  Hospital Course:  #1 chest pain -Patient had presented with chest pain, EKG without acute ischemic changes, troponin noted to be elevated but somewhat flat and 47>>> 67>>> 85. -Patient noted to have had a cardiac cath done in October 2019 with normal coronary arteries. -Patient was not hypoxic, PE less likely patient also with no signs of DVT and patient noted to be chronically anticoagulated with Eliquis. -Admitting physician was concerned as to whether patient had a non-STEMI versus chest pain  related to A-fib with RVR. -It was felt patient's symptoms improved after converting to normal sinus rhythm and was no longer dizzy with some improvement with chest pain. -Patient seen in consultation by cardiology who recommended  holding off on IV heparin initially and trending troponins with sublingual nitroglycerin as needed. -Patient noted to have some ongoing chest pain later on and subsequently placed on IV heparin and 2D echo recommended and patient started on amlodipine 2.5 mg daily for empiric treatment of vasospastic angina. -Patient reassessed by cardiology on day of discharge and he was felt patient's chest pain was more musculoskeletal in nature as it was reproducible and cardiology recommended no further workup at that time, IV heparin discontinued and cardiology recommended discharge home with outpatient follow-up if blood pressure improved. -BP improved and patient will be discharged home on ibuprofen 400 mg 3 times daily x 5 days, PPI, continuation of cardiac medications and outpatient follow-up with PCP.  2.  Paroxysmal atrial fibrillation with RVR -On presentation patient initially noted to be in A-fib with RVR, on EMS arrival heart rate noted to range from 1 15-1 35, they had patient sit up in bed down and subsequently converted back to normal sinus rhythm and remained in normal sinus rhythm throughout the hospitalization. -Patient maintained on home regimen of amiodarone, metoprolol and Eliquis and seen by cardiology during the hospitalization.  3.  Hypokalemia -Replete during the hospitalization -Outpatient follow-up.  4.  Chronic HFpEF -2D echo done June 2024 with a EF of 60 to 65%, grade 2 diastolic dysfunction, mildly elevated pulmonary artery systolic pressure, left atrium moderately dilated, mild MVR, mild AVR. -Patient compensated during the hospitalization.  5.  IgG lambda multiple myeloma -Outpatient follow-up with primary oncologist.  6.  Hypertension -Patient maintained on home regimen losartan, metoprolol and spironolactone. -Patient started on Norvasc 2.5 mg daily due to concerns for vasospasm. -BP initially elevated when patient assessed by cardiology the morning of discharge however  patient had not received any of her antihypertensive medications. -BP improved after receiving antihypertensive medications and patient cleared by cardiology for discharge with outpatient follow-up.  7.  Hyperlipidemia -Patient maintained on statin.  8.  Hypothyroidism -Patient maintained on home regimen Synthroid  9.  History of CVA -Patient maintained on home regimen Eliquis and pravastatin for secondary stroke prophylaxis.  10.  CKD stage IIIa -Stable.  Procedures: Chest x-ray 10/01/2023  Consultations: Cardiology: Dr. Hulan Saas 10/01/2023  Discharge Exam: Vitals:   10/02/23 1100 10/02/23 1303  BP: (!) 139/50 (!) 159/58  Pulse: 62 88  Resp: (!) 22 18  Temp:  (!) 97.5 F (36.4 C)  SpO2: 99% 99%    General: NAD Cardiovascular: RRR no murmurs rubs or gallops.  No JVD.  No lower extremity edema.  Left chest wall tender to palpation underneath the left breast. Respiratory: Clear to auscultation bilaterally.  No wheezes, no crackles, no rhonchi.  Fair air movement.  Speaking in full sentences.  Discharge Instructions   Discharge Instructions     Diet - low sodium heart healthy   Complete by: As directed    Increase activity slowly   Complete by: As directed       Allergies as of 10/02/2023       Reactions   Quinolones Other (See Comments)   Patient on Amiodarone and can prolong QT   Penicillins Other (See Comments)   UNSPECIFIED REACTION  Patient does not remember reaction.  Has patient had a PCN reaction causing immediate rash, facial/tongue/throat swelling, SOB or  lightheadedness with hypotension: no Has patient had a PCN reaction causing severe rash involving mucus membranes or skin necrosis: no Has patient had a PCN reaction that required hospitalization no Has patient had a PCN reaction occurring within the last 10 years: no If all of the above answers are "NO", then may proceed with Cephalosporin use.   Sulfa Antibiotics Other (See Comments)   UNSPECIFIED  REACTION  "maybe vision issues? "        Medication List     STOP taking these medications    ciprofloxacin 0.3 % ophthalmic solution Commonly known as: CILOXAN   predniSONE 50 MG tablet Commonly known as: DELTASONE       TAKE these medications    acyclovir 400 MG tablet Commonly known as: ZOVIRAX Take 1 tablet (400 mg total) by mouth 2 (two) times daily.   albuterol 108 (90 Base) MCG/ACT inhaler Commonly known as: VENTOLIN HFA Inhale 2 puffs into the lungs every 6 (six) hours as needed for wheezing or shortness of breath.   amiodarone 200 MG tablet Commonly known as: Pacerone Take 1 tablet (200 mg total) by mouth daily. What changed: how much to take   amLODipine 2.5 MG tablet Commonly known as: NORVASC Take 1 tablet (2.5 mg total) by mouth daily. Start taking on: October 03, 2023   apixaban 5 MG Tabs tablet Commonly known as: ELIQUIS Take 1 tablet (5 mg total) by mouth 2 (two) times daily.   [employer] VITAMIN B12 1000 MCG tablet Generic drug: cyanocobalamin TAKE 1 TABLET BY MOUTH EVERY DAY   dexamethasone 4 MG tablet Commonly known as: DECADRON Take 5 tablets (20 mg total) by mouth every 14 (fourteen) days. Take in the morning with food on treatment days What changed:  when to take this additional instructions   docusate sodium 100 MG capsule Commonly known as: COLACE Take 100 mg by mouth daily as needed for mild constipation.   ibuprofen 400 MG tablet Commonly known as: ADVIL Take 1 tablet (400 mg total) by mouth 3 (three) times daily for 5 days.   Klor-Con M10 10 MEQ tablet Generic drug: potassium chloride Take 10 mEq by mouth daily.   lenalidomide 10 MG capsule Commonly known as: REVLIMID Take 1 capsule (10 mg total) by mouth daily. [employer] Auth #  [id number] Date Obtained 09/24/23 Take 1 capsule daily for 21 days then none for 7 days   levothyroxine 100 MCG tablet Commonly known as: SYNTHROID Take 100 mcg by mouth daily before breakfast.    losartan 25 MG tablet Commonly known as: COZAAR Take 1 tablet (25 mg total) by mouth daily.   metoprolol tartrate 50 MG tablet Commonly known as: LOPRESSOR TAKE 1 TABLET BY MOUTH TWICE A DAY   pantoprazole 40 MG tablet Commonly known as: PROTONIX Take 1 tablet (40 mg total) by mouth daily at 6 (six) AM. Start taking on: October 03, 2023   polyethylene glycol 17 g packet Commonly known as: MIRALAX / GLYCOLAX Take 17 g by mouth daily as needed for mild constipation.   pravastatin 40 MG tablet Commonly known as: PRAVACHOL Take 40 mg by mouth every evening.   PreserVision AREDS Caps Take 1 capsule by mouth 2 (two) times daily.   Soothe XP Soln Place 1 drop into both eyes every evening.   spironolactone 25 MG tablet Commonly known as: ALDACTONE Take 1 tablet (25 mg total) by mouth daily.   Vitamin D3 50 MCG (2000 UT) capsule Take 1 capsule (2,000 Units total) by mouth  daily.       Allergies  Allergen Reactions   Quinolones Other (See Comments)    Patient on Amiodarone and can prolong QT   Penicillins Other (See Comments)    UNSPECIFIED REACTION  Patient does not remember reaction.  Has patient had a PCN reaction causing immediate rash, facial/tongue/throat swelling, SOB or lightheadedness with hypotension: no Has patient had a PCN reaction causing severe rash involving mucus membranes or skin necrosis: no Has patient had a PCN reaction that required hospitalization no Has patient had a PCN reaction occurring within the last 10 years: no If all of the above answers are "NO", then may proceed with Cephalosporin use.    Sulfa Antibiotics Other (See Comments)    UNSPECIFIED REACTION  "maybe vision issues? "    Follow-up Information     Irena Reichmann, DO. Schedule an appointment as soon as possible for a visit in 2 week(s).   Specialty: Family Medicine Contact information: [address] [place] [address] [phone]         Marcelino Duster, PA Follow up on 10/23/2023.   Specialties: Cardiology, Radiology Why: Follow-up at 8:25 AM Contact information: [address] [place] [address] [phone]                  The results of significant diagnostics from this hospitalization (including imaging, microbiology, ancillary and laboratory) are listed below for reference.    Significant Diagnostic Studies: DG Chest 2 View  Result Date: 10/01/2023 CLINICAL DATA:  Chest pain. EXAM: CHEST - 2 VIEW COMPARISON:  07/16/2023. FINDINGS: Trachea is midline. Heart is enlarged, stable. Aortic valve replacement. Thoracic aorta is calcified. Minimal streaky scarring in the lingula. Lungs are otherwise clear. No pleural fluid. Thoracic vertebral body augmentations. IMPRESSION: No acute findings. Electronically Signed   By: Leanna Battles M.D.   On: 10/01/2023 15:54   CT Head Wo Contrast  Result Date: 09/05/2023 CLINICAL DATA:  Provided history: Headache, neuro deficit. Dizziness. EXAM: CT HEAD WITHOUT CONTRAST TECHNIQUE: Contiguous axial images were obtained from the base of the skull through the vertex without intravenous contrast. RADIATION DOSE REDUCTION: This exam was performed according to the departmental dose-optimization program which includes automated exposure control, adjustment of the mA and/or kV according to patient size and/or use of iterative reconstruction technique. COMPARISON:  Head CT 04/13/2023.  Brain MRI 03/13/2023. FINDINGS: Brain: Generalized cerebral atrophy. Patchy and ill-defined hypoattenuation within the cerebral white matter, nonspecific but compatible with mild chronic small vessel ischemic disease. There is no acute intracranial hemorrhage. No demarcated cortical infarct. No extra-axial fluid collection. No evidence of an intracranial mass. No midline shift. Vascular: No hyperdense vessel.  Atherosclerotic calcifications. Skull: No calvarial fracture or aggressive osseous lesion.  Sinuses/Orbits: No mass or acute finding within the imaged orbits. Post-surgical appearance of the paranasal sinuses. Tiny left maxillary sinus mucous retention cyst. Minimal mucosal thickening scattered elsewhere within the paranasal sinuses. IMPRESSION: 1. No evidence of an acute intracranial abnormality. 2. Parenchymal atrophy and chronic small vessel ischemic disease. 3. Mild paranasal sinus disease as described. Electronically Signed   By: Jackey Loge D.O.   On: 09/05/2023 12:59    Microbiology: No results found for this or any previous visit (from the past 240 hour(s)).   Labs: Basic Metabolic Panel: Recent Labs  Lab 10/01/23 1050 10/01/23 2358 10/02/23 0249  NA 135  --  136  K 3.2*  --  3.7  CL 100  --  102  CO2 24  --  27  GLUCOSE 92  --  82  BUN 33*  --  32*  CREATININE 1.06*  --  0.89  CALCIUM 8.6*  --  8.2*  MG  --  1.9  --    Liver Function Tests: No results for input(s): "AST", "ALT", "ALKPHOS", "BILITOT", "PROT", "ALBUMIN" in the last 168 hours. No results for input(s): "LIPASE", "AMYLASE" in the last 168 hours. No results for input(s): "AMMONIA" in the last 168 hours. CBC: Recent Labs  Lab 10/01/23 1050 10/02/23 0756  WBC 6.5 5.1  HGB 13.5 12.4  HCT 41.7 38.1  MCV 97.4 95.5  PLT 153 143*   Cardiac Enzymes: No results for input(s): "CKTOTAL", "CKMB", "CKMBINDEX", "TROPONINI" in the last 168 hours. BNP: BNP (last 3 results) Recent Labs    02/17/23 0922 04/13/23 0950  BNP 454.3* 266.5*    ProBNP (last 3 results) No results for input(s): "PROBNP" in the last 8760 hours.  CBG: No results for input(s): "GLUCAP" in the last 168 hours.     Signed:  Ramiro Harvest MD.  [facility] 10/02/2023, 3:38 PM

## 2023-10-02 NOTE — Progress Notes (Signed)
   Patient Name: [name] Date of Encounter: 10/02/2023 [facility] Cardiologist: Yates Decamp, MD   Interval Summary  .    Still with left sided chest discomfort that is reproducible on palpitation on exam.    Vital Signs .    Vitals:   10/02/23 0529 10/02/23 0600 10/02/23 0700 10/02/23 0800  BP: (!) 178/71 (!) 174/64 (!) 183/90 (!) 143/84  Pulse:  60 (!) 57 63  Resp:  (!) 22 10 12   Temp:      TempSrc:      SpO2:  94% 98% 97%  Weight:      Height:       No intake or output data in the 24 hours ending 10/02/23 0924    10/01/2023   10:21 AM 09/06/2023   10:48 AM 09/05/2023   10:33 AM  Last 3 Weights  Weight (lbs) 148 lb 13 oz 150 lb 6.4 oz 149 lb  Weight (kg) 67.5 kg 68.221 kg 67.586 kg      Telemetry/ECG    Sinus Rhythm - Personally Reviewed  Physical Exam .   GEN: No acute distress.   Neck: No JVD Cardiac: RRR, no murmurs, rubs, or gallops.  Respiratory: Clear to auscultation bilaterally. GI: Soft, nontender, non-distended  MS: No edema  Assessment & Plan .     89 y.o. female with a hx of A-fib, CHF, multiple myeloma, aortic stenosis s/p TAVR in 2019, prior embolic stroke on chronic anticoagulation and afib who was seen for the evaluation of chest pain at the request of the emergency department.   Chest pain Elevated troponin (type II) -- presented with dizziness, and found to be in afib RVR. Converted to sinus rhythm with EMS prior to arrival -- hsTn 47>>67>>85>>96>>86 -- suspect demand ischemia in the setting of afib RVR -- she also complains of chest pain under her left breast and rib area that is reproducible on palpitation consistent with MSK pain -- will stop IV heparin and resume Eliquis this morning -- continue amlodipine, metoprolol   Paroxsymal atrial fibrillation -- found to be in atrial fibrillation with RVR rates 115-140, converted to sinus rhythm via EMS -- remains in SR -- continue metoprolol, amiodarone, resume  Eliquis  HTN -- continue metoprolol, losartan, amlodipine, spiro, amlodipine added this morning  Per Primary Multiple myeloma Hypothyroidism CVA CKD IIIa  For questions or updates, please contact [facility] Please consult [email or web address] for contact info under        Signed, [name], NP

## 2023-10-02 NOTE — Care Management Obs Status (Signed)
MEDICARE OBSERVATION STATUS NOTIFICATION   Patient Details  Name: Zoe [name] MRN: [id number] Date of Birth: 05-Nov-1933   Medicare Observation Status Notification Given:  Yes    Oletta Cohn, RN 10/02/2023, 12:06 PM

## 2023-10-02 NOTE — ED Notes (Signed)
Patient was assisted to the bathroom via a wheelchair. Patient is now resting in bed. Call bell is within reach.

## 2023-10-02 NOTE — Progress Notes (Addendum)
  Chest pain unclear due to non-ST MI versus in the setting of A-fib RVR: Patient's nurse reported that patient is complaining about chest pain.  Otherwise hemodynamically stable.   Initially chest pain has been subsided after patient received sublingual nitroglycerin 0.4 mg in the ED. Troponin 47 which has been trending up to 96. EKG unremarkable for any acute ischemic change. Currently on heparin drip.   Patient received second dose of nitroglycerin 0.4 mg and chest pain has been improved 6/210 to 3/10.  Holding further nitroglycerin as patient is bradycardic heart rate trended down to 61. Plan to continue heparin drip. Continues sublingual nitroglycerin Cardiology has been consulted will follow up with recommendation.    Tereasa Coop, MD [facility] 10/02/2023, 2:50 AM

## 2023-10-04 ENCOUNTER — Inpatient Hospital Stay: Payer: Medicare Other | Attending: Physician Assistant | Admitting: Hematology and Oncology

## 2023-10-04 ENCOUNTER — Other Ambulatory Visit: Payer: Self-pay

## 2023-10-04 ENCOUNTER — Other Ambulatory Visit: Payer: Self-pay | Admitting: Hematology and Oncology

## 2023-10-04 ENCOUNTER — Inpatient Hospital Stay: Payer: Medicare Other

## 2023-10-04 VITALS — BP 124/62 | HR 62 | Temp 98.3°F | Resp 13 | Wt 157.9 lb

## 2023-10-04 DIAGNOSIS — I4891 Unspecified atrial fibrillation: Secondary | ICD-10-CM | POA: Diagnosis not present

## 2023-10-04 DIAGNOSIS — D649 Anemia, unspecified: Secondary | ICD-10-CM | POA: Insufficient documentation

## 2023-10-04 DIAGNOSIS — G629 Polyneuropathy, unspecified: Secondary | ICD-10-CM

## 2023-10-04 DIAGNOSIS — Z79624 Long term (current) use of inhibitors of nucleotide synthesis: Secondary | ICD-10-CM | POA: Diagnosis not present

## 2023-10-04 DIAGNOSIS — Z7961 Long term (current) use of immunomodulator: Secondary | ICD-10-CM | POA: Diagnosis not present

## 2023-10-04 DIAGNOSIS — Z7901 Long term (current) use of anticoagulants: Secondary | ICD-10-CM | POA: Insufficient documentation

## 2023-10-04 DIAGNOSIS — C9000 Multiple myeloma not having achieved remission: Secondary | ICD-10-CM | POA: Insufficient documentation

## 2023-10-04 DIAGNOSIS — E538 Deficiency of other specified B group vitamins: Secondary | ICD-10-CM | POA: Diagnosis not present

## 2023-10-04 DIAGNOSIS — Z79899 Other long term (current) drug therapy: Secondary | ICD-10-CM | POA: Diagnosis not present

## 2023-10-04 DIAGNOSIS — I959 Hypotension, unspecified: Secondary | ICD-10-CM | POA: Insufficient documentation

## 2023-10-04 LAB — CBC WITH DIFFERENTIAL (CANCER CENTER ONLY)
Abs Immature Granulocytes: 0.05 10*3/uL (ref 0.00–0.07)
Basophils Absolute: 0.1 10*3/uL (ref 0.0–0.1)
Basophils Relative: 1 %
Eosinophils Absolute: 0.1 10*3/uL (ref 0.0–0.5)
Eosinophils Relative: 2 %
HCT: 41 % (ref 36.0–46.0)
Hemoglobin: 14 g/dL (ref 12.0–15.0)
Immature Granulocytes: 1 %
Lymphocytes Relative: 16 %
Lymphs Abs: 1.1 10*3/uL (ref 0.7–4.0)
MCH: 32.6 pg (ref 26.0–34.0)
MCHC: 34.1 g/dL (ref 30.0–36.0)
MCV: 95.3 fL (ref 80.0–100.0)
Monocytes Absolute: 1.6 10*3/uL — ABNORMAL HIGH (ref 0.1–1.0)
Monocytes Relative: 24 %
Neutro Abs: 3.9 10*3/uL (ref 1.7–7.7)
Neutrophils Relative %: 56 %
Platelet Count: 180 10*3/uL (ref 150–400)
RBC: 4.3 MIL/uL (ref 3.87–5.11)
RDW: 16.2 % — ABNORMAL HIGH (ref 11.5–15.5)
WBC Count: 6.9 10*3/uL (ref 4.0–10.5)
nRBC: 0 % (ref 0.0–0.2)

## 2023-10-04 LAB — CMP (CANCER CENTER ONLY)
ALT: 13 U/L (ref 0–44)
AST: 13 U/L — ABNORMAL LOW (ref 15–41)
Albumin: 3.8 g/dL (ref 3.5–5.0)
Alkaline Phosphatase: 77 U/L (ref 38–126)
Anion gap: 7 (ref 5–15)
BUN: 30 mg/dL — ABNORMAL HIGH (ref 8–23)
CO2: 28 mmol/L (ref 22–32)
Calcium: 8.8 mg/dL — ABNORMAL LOW (ref 8.9–10.3)
Chloride: 99 mmol/L (ref 98–111)
Creatinine: 1.07 mg/dL — ABNORMAL HIGH (ref 0.44–1.00)
GFR, Estimated: 50 mL/min — ABNORMAL LOW (ref >60)
Glucose, Bld: 99 mg/dL (ref 70–99)
Potassium: 4.4 mmol/L (ref 3.5–5.1)
Sodium: 134 mmol/L — ABNORMAL LOW (ref 135–145)
Total Bilirubin: 0.5 mg/dL (ref <1.2)
Total Protein: 5.9 g/dL — ABNORMAL LOW (ref 6.5–8.1)

## 2023-10-04 LAB — LACTATE DEHYDROGENASE: LDH: 215 U/L — ABNORMAL HIGH (ref 98–192)

## 2023-10-04 NOTE — Progress Notes (Signed)
[facility] [phone]   [phone]   PROGRESS NOTE   Patient Care Team: [facility], [facility] as PCP - General (Rheumatology)   Hematological/Oncological History # IgG Lambda Multiple Myeloma  06/14/2022: establish care with Zoe [name] due to anemia. Labs showed M protein 4.9, Kappa 7.2, Lambda 10.8, ratio 0.67 07/13/2022: Bmbx showed Lambda restricted plasma cell neoplasm involving approximately 60% of the cellular marrow by IHC on the biopsy.  08/01/2022: Cycle 1 Day 1 of VRd chemotherapy. (Holding revlimid initially) 08/21/2022:  Cycle 2 Day 1 of VRd chemotherapy. (Holding revlimid) 09/04/2022: Cycle 3 Day 1 of VRd chemotherapy. Started revlimid 10/03/2022: Cycle 4 Day 1 of VRd chemotherapy 10/31/2022: Cycle 5 Day 1 of VRd chemotherapy 11/20/2022: Cycle 6 Day 1 of VRd chemotherapy 12/11/2022: Cycle 7 Day 1 of VRd chemotherapy 01/02/2023: Cycle 8 Day 1 of VRd chemotherapy 01/22/2023: Cycle 9 Day 1 of VRd chemotherapy 02/23/2023: Day 1 Cycle 1 of Dara/Rev/Dex 03/23/2023: Day 1 Cycle 2 of Dara/Rev/Dex 05/06/2023: chemotherapy on pause due to chest pain/cardiac/respiratory issues.  05/18/2023: Cycle 3 Day 1 of Dara/Rev/Dex 06/15/2023: Cycle 4 Day 1 of Dara/Rev/Dex 07/13/2023: Cycle 5 Day 1 of Dara/Rev/Dex 08/10/2023: Cycle 6 Day 1 of Dara/Rev/Dex. Transition to maintenance revlimid.     INTERVAL HISTORY: Zoe [name] 89 y.o. female returns to the clinic today for a follow-up visit accompanied by her daughter-in-law. Her last visit was on 09/06/2023. In the interim, she continued on maintenance revlimid.   Zoe [name] reports she has been well overall in the interim since our last visit.  She reports that she is tolerating maintenance Revlimid well with no major side effects or symptoms.  She does have occasional twinges of chest pain but no shortness of breath and it is not persistent.  She reports that her appetite is good and her weight has  remained steadily increasing, up to 187 pounds from 152 pounds in October.  Additionally she has no questions concerns or complaints today. A full 10 point ROS is otherwise negative.  She denies any fevers, chills, sweats, shortness of breath, chest pain or cough.  She has no other complaints.  A full 10 point ROS is otherwise negative.  MEDICAL HISTORY: Past Medical History:  Diagnosis Date   Arthritis    some - per patient   Breast cancer (HCC)    breast cancer / left    Cataract    bilat    GERD (gastroesophageal reflux disease)    History of kidney stones    Hyperlipidemia    Hypertension    Hypothyroidism    Macular degeneration    Left   S/P TAVR (transcatheter aortic valve replacement) 09/03/2018   23 mm Edwards Sapien 3 transcatheter heart valve placed via percutaneous right transfemoral approach    Severe aortic stenosis    Stress incontinence    Thyroid disease    Tinnitus     ALLERGIES:  is allergic to quinolones, penicillins, and sulfa antibiotics.  MEDICATIONS:  Current Outpatient Medications  Medication Sig Dispense Refill   acyclovir (ZOVIRAX) 400 MG tablet Take 1 tablet (400 mg total) by mouth 2 (two) times daily. 60 tablet 3   albuterol (VENTOLIN HFA) 108 (90 Base) MCG/ACT inhaler Inhale 2 puffs into the lungs every 6 (six) hours as needed for wheezing or shortness of breath. 8 g 2   amiodarone (PACERONE) 200 MG tablet Take 1 tablet (200 mg total) by mouth daily. (Patient taking differently: Take 100 mg by mouth daily.)  45 tablet 1   amLODipine (NORVASC) 2.5 MG tablet Take 1 tablet (2.5 mg total) by mouth daily. 30 tablet 1   apixaban (ELIQUIS) 5 MG TABS tablet Take 1 tablet (5 mg total) by mouth 2 (two) times daily. 180 tablet 1   Artificial Tear Solution (SOOTHE XP) SOLN Place 1 drop into both eyes every evening.     Cholecalciferol (VITAMIN D3) 50 MCG (2000 UT) capsule Take 1 capsule (2,000 Units total) by mouth daily.     [employer] VITAMIN B12 1000 MCG tablet TAKE  1 TABLET BY MOUTH EVERY DAY (Patient not taking: Reported on 10/01/2023) 90 tablet 1   dexamethasone (DECADRON) 4 MG tablet Take 5 tablets (20 mg total) by mouth every 14 (fourteen) days. Take in the morning with food on treatment days (Patient taking differently: Take 20 mg by mouth every 21 ( twenty-one) days.) 30 tablet 2   docusate sodium (COLACE) 100 MG capsule Take 100 mg by mouth daily as needed for mild constipation.     KLOR-CON M10 10 MEQ tablet Take 10 mEq by mouth daily. (Patient not taking: Reported on 10/01/2023)     lenalidomide (REVLIMID) 10 MG capsule Take 1 capsule (10 mg total) by mouth daily. [employer] Auth #  [id number] Date Obtained 09/24/23 Take 1 capsule daily for 21 days then none for 7 days 21 capsule 0   levothyroxine (SYNTHROID, LEVOTHROID) 100 MCG tablet Take 100 mcg by mouth daily before breakfast.  2   losartan (COZAAR) 25 MG tablet Take 1 tablet (25 mg total) by mouth daily. 30 tablet 2   metoprolol tartrate (LOPRESSOR) 50 MG tablet TAKE 1 TABLET BY MOUTH TWICE A DAY 180 tablet 3   Multiple Vitamins-Minerals (PRESERVISION AREDS) CAPS Take 1 capsule by mouth 2 (two) times daily.     pantoprazole (PROTONIX) 40 MG tablet Take 1 tablet (40 mg total) by mouth daily at 6 (six) AM. 30 tablet 0   polyethylene glycol (MIRALAX / GLYCOLAX) 17 g packet Take 17 g by mouth daily as needed for mild constipation.     pravastatin (PRAVACHOL) 40 MG tablet Take 40 mg by mouth every evening.     spironolactone (ALDACTONE) 25 MG tablet Take 1 tablet (25 mg total) by mouth daily. 30 tablet 2   No current facility-administered medications for this visit.    SURGICAL HISTORY:  Past Surgical History:  Procedure Laterality Date   ABDOMINAL HYSTERECTOMY  1970's   BACK SURGERY     BREAST LUMPECTOMY  12/1998   lumpectomy   CARDIAC CATHETERIZATION     CARDIOVERSION N/A 04/18/2023   Procedure: CARDIOVERSION;  Surgeon: Tessa Lerner, DO;  Location: [facility] INVASIVE CV LAB;  Service: Cardiovascular;   Laterality: N/A;   CARDIOVERSION N/A 04/20/2023   Procedure: CARDIOVERSION;  Surgeon: Yates Decamp, MD;  Location[facility] INVASIVE CV LAB;  Service: Cardiovascular;  Laterality: N/A;   EYE SURGERY     cataract surgery bilat    INTRAOPERATIVE TRANSTHORACIC ECHOCARDIOGRAM N/A 09/03/2018   Procedure: INTRAOPERATIVE TRANSTHORACIC ECHOCARDIOGRAM;  Surgeon: Kathleene Hazel, MD;  Location: [facility] OR;  Service: Open Heart Surgery;  Laterality: N/A;   KYPHOPLASTY N/A 09/07/2022   Procedure: THORACIC EIGHT KYPHOPLASTY;  Surgeon: Venita Lick, MD;  Location: [facility] OR;  Service: Orthopedics;  Laterality: N/A;  1 hr Local with [facility] 3 C-Bed   LITHOTRIPSY     Right total knee     2018 Dr. Charlann Boxer   RIGHT/LEFT HEART CATH AND CORONARY ANGIOGRAPHY N/A 08/06/2018   Procedure: RIGHT/LEFT  HEART CATH AND CORONARY ANGIOGRAPHY;  Surgeon: Yates Decamp, MD;  Location: [facility] INVASIVE CV LAB;  Service: Cardiovascular;  Laterality: N/A;   THYROIDECTOMY, PARTIAL  1975   TONSILLECTOMY     as a child - patient not sure of exact date   TOTAL KNEE ARTHROPLASTY Left 03/13/2016   Procedure: TOTAL KNEE ARTHROPLASTY;  Surgeon: Durene Romans, MD;  Location: [facility] ORS;  Service: Orthopedics;  Laterality: Left;   TOTAL KNEE ARTHROPLASTY Right 06/18/2017   Procedure: RIGHT TOTAL KNEE ARTHROPLASTY;  Surgeon: Durene Romans, MD;  Location: [facility] ORS;  Service: Orthopedics;  Laterality: Right;   TRANSCATHETER AORTIC VALVE REPLACEMENT, TRANSFEMORAL N/A 09/03/2018   Procedure: TRANSCATHETER AORTIC VALVE REPLACEMENT, TRANSFEMORAL;  Surgeon: Kathleene Hazel, MD;  Location: [facility] OR;  Service: Open Heart Surgery;  Laterality: N/A;   REVIEW OF SYSTEMS:   Constitutional: ( - ) fevers, ( - )  chills , ( - ) night sweats Eyes: ( - ) blurriness of vision, ( - ) double vision, ( - ) watery eyes Ears, nose, mouth, throat, and face: ( - ) mucositis, ( - ) sore throat Respiratory: ( -) cough, ( - ) dyspnea, ( - ) wheezes Cardiovascular: ( - )  palpitation, ( - ) chest discomfort, ( +) lower extremity swelling Gastrointestinal:  ( - ) nausea, ( - ) heartburn, ( - ) change in bowel habits Skin: ( - ) abnormal skin rashes Lymphatics: ( - ) new lymphadenopathy, ( - ) easy bruising Neurological: ( - ) numbness, ( - ) tingling, ( - ) new weaknesses Behavioral/Psych: ( - ) mood change, ( - ) new changes  All other systems were reviewed with the patient and are negative.   PHYSICAL EXAMINATION:  Blood pressure 124/62, pulse 62, temperature 98.3 F (36.8 C), temperature source Temporal, resp. rate 13, weight 157 lb 14.4 oz (71.6 kg), SpO2 100%.  ECOG PERFORMANCE STATUS: 1  GENERAL: well appearing elderly Caucasian female, alert, no distress and comfortable SKIN: skin color, texture, turgor are normal, no rashes or significant lesions EYES: conjunctiva are pink and non-injected, sclera clear LUNGS:normal breathing effort. Mild wheezing and crackles in lower lobes b/l.  HEART: irregular rhythm ,normal rate and no murmurs.  Musculoskeletal: no cyanosis of digits and no clubbing  PSYCH: alert & oriented x 3, fluent speech NEURO: no focal motor/sensory deficits    LABORATORY DATA: Lab Results  Component Value Date   WBC 6.9 10/04/2023   HGB 14.0 10/04/2023   HCT 41.0 10/04/2023   MCV 95.3 10/04/2023   PLT 180 10/04/2023      Chemistry      Component Value Date/Time   NA 134 (L) 10/04/2023 1301   NA 130 (L) 06/19/2022 1012   K 4.4 10/04/2023 1301   CL 99 10/04/2023 1301   CO2 28 10/04/2023 1301   BUN 30 (H) 10/04/2023 1301   BUN 21 06/19/2022 1012   CREATININE 1.07 (H) 10/04/2023 1301      Component Value Date/Time   CALCIUM 8.8 (L) 10/04/2023 1301   ALKPHOS 77 10/04/2023 1301   AST 13 (L) 10/04/2023 1301   ALT 13 10/04/2023 1301   BILITOT 0.5 10/04/2023 1301       RADIOGRAPHIC STUDIES:  DG Chest 2 View Result Date: 10/01/2023 CLINICAL DATA:  Chest pain. EXAM: CHEST - 2 VIEW COMPARISON:  07/16/2023. FINDINGS:  Trachea is midline. Heart is enlarged, stable. Aortic valve replacement. Thoracic aorta is calcified. Minimal streaky scarring in the lingula. Lungs are otherwise clear. No  pleural fluid. Thoracic vertebral body augmentations. IMPRESSION: No acute findings. Electronically Signed   By: Leanna Battles M.D.   On: 10/01/2023 15:54     ASSESSMENT/PLAN:  Zoe [name] is a 89 y.o. female who presents to the clinic for evaluation for IgG lambda multiple myeloma.   # IgG Lambda Multiple Myeloma  --diagnosis of MM confirmed with bone marrow biopsy showing 60% plasma cells and anemia -- Bone survey shows no lytic lesions, kidney function is within normal limits.  --08/01/2022 was Cycle 1 Day 1 of VRd  -Dr. Leonides Schanz discontinued velcade on 02/05/23 due to rash. She is status post day 15 cycle #9.  -Dr. Leonides Schanz changed the care plan to Daratumumab, Revlimid, and decadron. She is scheduled for day 1 cycle #1 today  -Started Cycle 1, Day 1 of Dara/Rev/Dex on 02/23/2023.  Plan:  --Labs show white blood cell count white blood cell 6.9, Hgb 14.0, MCV 95.3, Plt 180 --last M protein from 07/13/2023 trended down to 0.1 (4.9 prior to start of therapy). Dara IFE shows no M protein detected. Normalized SFLC.  --continue revlimid 10 mg p.o. daily.  Close monitoring to assure no worsening blood counts. --Strict precautions for bleeding given.  --RTC in 4 weeks for clinic visit   #Dysuria/increased urinary frequency: --Recently completed Keflex therapy for UTI, still complaining of occasional dysuria and urgency.  #Hypotension/BP/Atrial Fibrillation  --Follows with cardiology, Dr. Jacinto Halim -- Recommendations include metoprolol succinate 25 mg PO daily and  amiodarone 100 mg PO daily --Continue on Eliquis 5 mg BID   #Bilateral lower extremity edema:  --Continue to wear compression stockings.    # Normocytic Anemia #Vitamin B12 Deficiency --Anemia likely driven by multiple myeloma, but may be component of vitamin  B12 deficiency as well. --initial labs showed elevated MMA with B12 180 -- Continue vitamin B12 1000 mcg p.o. daily -- Continue to monitor   #Left leg/thigh neuropathy--improving: --Currently on gabapentin 900 mg nightly and 600 mg in the morning --Encouraged to try water aerobics and stretches.  --Following with Dr. Debbe Bales (ortho) to see if she is a candidate for steroid injections.  -- Patient evaluated by Dr. Barbaraann Cao thinks that this may be neuropathy worsened by her Velcade therapy.   #Episode of right UE tremor and speech abnormality: --Evaluated by Dr. Barbaraann Cao on 03/20/2023. Presumed etiology had been atrial fibrillation, but vascular disease, atheromatous changes, are also possibly implicated concurrently.  --MRI brain from 03/13/2023 show no acute finding including no evidence of intracranial neoplastic disease. There are chronic small-vessel ischemic changes of the pons, cerebral hemispheric white matter and right cerebellum. --Educated patient to seek immediate evaluate if there are repeat episodes. --No neurological deficits identified per exam today  #Supportive Care -- chemotherapy education complete.  -- port placement not required.  --Awaiting to start Zometa/Xgeva  -- zofran 8mg  q8H PRN and compazine 10mg  PO q6H for nausea -- acyclovir 400mg  PO BID for VCZ prophylaxis -- tylenol 1000 mg q8H PRN for back pain.   No orders of the defined types were placed in this encounter.   Patient expressed understanding of the plan provided.   I have spent a total of 30 minutes minutes of face-to-face and non-face-to-face time, preparing to see the patient, performing a medically appropriate examination, counseling and educating the patient, ordering tests/procedures, documenting clinical information in the electronic health record, and care coordination.   Ulysees Barns, MD Department of Hematology/Oncology [facility] Phone: [phone] Pager:  [phone] Email: [email or web address]

## 2023-10-05 ENCOUNTER — Telehealth: Payer: Self-pay | Admitting: Hematology and Oncology

## 2023-10-05 NOTE — Telephone Encounter (Signed)
Rescheduled appointment per patients request via incoming call. Patient is aware of the changes made to her upcoming appointments.

## 2023-10-06 LAB — KAPPA/LAMBDA LIGHT CHAINS
Kappa free light chain: 6.6 mg/L (ref 3.3–19.4)
Kappa, lambda light chain ratio: 0.8 (ref 0.26–1.65)
Lambda free light chains: 8.3 mg/L (ref 5.7–26.3)

## 2023-10-08 ENCOUNTER — Ambulatory Visit: Payer: Medicare Other

## 2023-10-10 ENCOUNTER — Encounter (INDEPENDENT_AMBULATORY_CARE_PROVIDER_SITE_OTHER): Payer: Medicare Other | Admitting: Ophthalmology

## 2023-10-10 DIAGNOSIS — H353231 Exudative age-related macular degeneration, bilateral, with active choroidal neovascularization: Secondary | ICD-10-CM | POA: Diagnosis not present

## 2023-10-10 DIAGNOSIS — I10 Essential (primary) hypertension: Secondary | ICD-10-CM

## 2023-10-10 DIAGNOSIS — H35033 Hypertensive retinopathy, bilateral: Secondary | ICD-10-CM | POA: Diagnosis not present

## 2023-10-10 DIAGNOSIS — H43813 Vitreous degeneration, bilateral: Secondary | ICD-10-CM | POA: Diagnosis not present

## 2023-10-10 NOTE — Progress Notes (Signed)
 Cardiology Office Note:    Date:  10/23/2023   ID:  Zoe  B [name], DOB 06-12-1934, MRN [id number]  PCP:  Gerome Brunet, DO   [facility] Providers Cardiologist:  Gordy Bergamo, MD     Referring MD: Gerome Brunet, DO   Chief Complaint  Patient presents with   Follow-up    CHF    History of Present Illness:    Zoe  B [name] is a 89 y.o. female with a hx of PAF, CHF, HTN, multiple myeloma, aortic stenosis s/p TAVR in 2019, prior embolic stroke on chronic anticoagulation.   She dispatched EMS on 10/01/2023 in the setting of dizziness.  Per EMS, strip showed A-fib with RVR in the 115 to 130 range.  She was brought to [facility] ED for evaluation and was in sinus rhythm on arrival.  Per EMS, they had her perform vagal maneuvers and she converted.  She also described chest pressure that apparently resolved when she converted to sinus rhythm.  She was chest pain-free in the ER.  Troponin elevation was mild and flat, EKG was nonischemic.  Case discussed with cardiology who initially felt discharged without admission was appropriate with the addition of 2.5 mg Norvasc .  However patient had recurrent chest pain and she was admitted to medicine service.  Cardiac enzymes trended to 96, cardiology suspected demand ischemia in the setting of A-fib RVR.  Chest pain reproducible on palpation consistent with MSK pain.  IV heparin  was transition back to Eliquis  and she was discharged on 10/02/2023.  She presents today for cardiology follow-up. She has not taken spironolactone  in 5 days waiting for a refill. Her main complaints is balance issues and she requests a referral to PT for this. She feels weak and dizzy most days but this is even before AM medications and has been occurring for months. She takes PRN lasix  ?20 mg PRN for 2-3 days at a time.    She does not think the 2.5 mg amlodipine  has made a difference. Diastolic BP borderline at 60. I will stop amlodipine  and discontinued spironolactone .  She will continue PRN lasix /K.    Past Medical History:  Diagnosis Date   Arthritis    some - per patient   Breast cancer (HCC)    breast cancer / left    Cataract    bilat    GERD (gastroesophageal reflux disease)    History of kidney stones    Hyperlipidemia    Hypertension    Hypothyroidism    Macular degeneration    Left   S/P TAVR (transcatheter aortic valve replacement) 09/03/2018   23 mm Edwards Sapien 3 transcatheter heart valve placed via percutaneous right transfemoral approach    Severe aortic stenosis    Stress incontinence    Thyroid  disease    Tinnitus     Past Surgical History:  Procedure Laterality Date   ABDOMINAL HYSTERECTOMY  1970's   BACK SURGERY     BREAST LUMPECTOMY  12/1998   lumpectomy   CARDIAC CATHETERIZATION     CARDIOVERSION N/A 04/18/2023   Procedure: CARDIOVERSION;  Surgeon: Michele Richardson, DO;  Location: [facility] INVASIVE CV LAB;  Service: Cardiovascular;  Laterality: N/A;   CARDIOVERSION N/A 04/20/2023   Procedure: CARDIOVERSION;  Surgeon: Bergamo Gordy, MD;  Location: [facility] INVASIVE CV LAB;  Service: Cardiovascular;  Laterality: N/A;   EYE SURGERY     cataract surgery bilat    INTRAOPERATIVE TRANSTHORACIC ECHOCARDIOGRAM N/A 09/03/2018   Procedure: INTRAOPERATIVE TRANSTHORACIC ECHOCARDIOGRAM;  Surgeon: Verlin Lonni BIRCH,  MD;  Location: [facility] OR;  Service: Open Heart Surgery;  Laterality: N/A;   KYPHOPLASTY N/A 09/07/2022   Procedure: THORACIC EIGHT KYPHOPLASTY;  Surgeon: Burnetta Aures, MD;  Location: [facility] OR;  Service: Orthopedics;  Laterality: N/A;  1 hr Local with [facility] 3 C-Bed   LITHOTRIPSY     Right total knee     2018 Dr. Ernie   RIGHT/LEFT HEART CATH AND CORONARY ANGIOGRAPHY N/A 08/06/2018   Procedure: RIGHT/LEFT HEART CATH AND CORONARY ANGIOGRAPHY;  Surgeon: Ladona Heinz, MD;  Location: [facility] INVASIVE CV LAB;  Service: Cardiovascular;  Laterality: N/A;   THYROIDECTOMY, PARTIAL  1975   TONSILLECTOMY     as a child - patient not sure of exact  date   TOTAL KNEE ARTHROPLASTY Left 03/13/2016   Procedure: TOTAL KNEE ARTHROPLASTY;  Surgeon: Donnice Ernie, MD;  Location: [facility] ORS;  Service: Orthopedics;  Laterality: Left;   TOTAL KNEE ARTHROPLASTY Right 06/18/2017   Procedure: RIGHT TOTAL KNEE ARTHROPLASTY;  Surgeon: Ernie Donnice, MD;  Location: [facility] ORS;  Service: Orthopedics;  Laterality: Right;   TRANSCATHETER AORTIC VALVE REPLACEMENT, TRANSFEMORAL N/A 09/03/2018   Procedure: TRANSCATHETER AORTIC VALVE REPLACEMENT, TRANSFEMORAL;  Surgeon: Verlin Lonni BIRCH, MD;  Location: [facility] OR;  Service: Open Heart Surgery;  Laterality: N/A;    Current Medications: Current Meds  Medication Sig   acyclovir  (ZOVIRAX ) 400 MG tablet Take 1 tablet (400 mg total) by mouth 2 (two) times daily.   Artificial Tear Solution (SOOTHE XP) SOLN Place 1 drop into both eyes every evening.   Cholecalciferol  (VITAMIN D3) 50 MCG (2000 UT) capsule Take 1 capsule (2,000 Units total) by mouth daily.   docusate sodium  (COLACE) 100 MG capsule Take 100 mg by mouth daily as needed for mild constipation.   furosemide  (LASIX ) 20 MG tablet Take 1 tablet (20 mg total) by mouth as needed for edema.   KLOR-CON  M10 10 MEQ tablet Take 10 mEq by mouth daily.   lenalidomide  (REVLIMID ) 10 MG capsule Take 1 capsule (10 mg total) by mouth daily. [employer] Auth #  [id number] Date Obtained 10/18/23 Take 1 capsule daily for 21 days then none for 7 days   levothyroxine  (SYNTHROID , LEVOTHROID) 100 MCG tablet Take 100 mcg by mouth daily before breakfast.   metoprolol  tartrate (LOPRESSOR ) 50 MG tablet TAKE 1 TABLET BY MOUTH TWICE A DAY   Multiple Vitamins-Minerals (PRESERVISION AREDS) CAPS Take 1 capsule by mouth 2 (two) times daily.   pantoprazole  (PROTONIX ) 40 MG tablet Take 1 tablet (40 mg total) by mouth daily at 6 (six) AM.   polyethylene glycol (MIRALAX  / GLYCOLAX ) 17 g packet Take 17 g by mouth daily as needed for mild constipation.   potassium chloride  (KLOR-CON ) 10 MEQ tablet Take 1 tablet  (10 mEq total) by mouth as needed (TAKE WITH LASIX  FOR SWELLING).   pravastatin  (PRAVACHOL ) 40 MG tablet Take 40 mg by mouth every evening.   [DISCONTINUED] amiodarone  (PACERONE ) 200 MG tablet Take 1 tablet (200 mg total) by mouth daily. (Patient taking differently: Take 100 mg by mouth daily.)   [DISCONTINUED] amLODipine  (NORVASC ) 2.5 MG tablet Take 1 tablet (2.5 mg total) by mouth daily.   [DISCONTINUED] losartan  (COZAAR ) 25 MG tablet Take 1 tablet (25 mg total) by mouth daily.   [DISCONTINUED] magnesium  30 MG tablet Take 1 tablet (30 mg total) by mouth as needed (TAKE WITH LASIX ).   [DISCONTINUED] spironolactone  (ALDACTONE ) 25 MG tablet Take 1 tablet (25 mg total) by mouth daily.     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  Tobacco Use   Smoking status: Never   Smokeless tobacco: Never  Vaping Use   Vaping status: Never Used  Substance and Sexual Activity   Alcohol  use: No   Drug use: No   Sexual activity: Not Currently  Other Topics Concern   Not on file  Social History Narrative   Not on file   Social Drivers of Health   Financial Resource Strain: Low Risk  (11/12/2018)   Overall Financial Resource Strain (CARDIA)    Difficulty of Paying Living Expenses: Not very hard  Food Insecurity: No Food Insecurity (07/16/2023)   Hunger Vital Sign    Worried About Running Out of Food in the Last Year: Never true    Ran Out of Food in the Last Year: Never true  Transportation Needs: No Transportation Needs (07/16/2023)   PRAPARE - Administrator, Civil Service (Medical): No    Lack of Transportation (Non-Medical): No  Physical  Activity: Inactive (11/12/2018)   Exercise Vital Sign    Days of Exercise per Week: 0 days    Minutes of Exercise per Session: 0 min  Stress: Stress Concern Present (11/12/2018)   [employer] of Occupational Health - Occupational Stress Questionnaire    Feeling of Stress : To some extent  Social Connections: Not on file     Family History: The patient's family history includes Coronary artery disease in her father; Diabetes in her mother and sister; Heart disease in her father; Stroke in her mother.  ROS:   Please see the history of present illness.     All other systems reviewed and are negative.  EKGs/Labs/Other Studies Reviewed:    The following studies were reviewed today:       Recent Labs: 04/13/2023: B Natriuretic Peptide 266.5 10/01/2023: Magnesium  1.9; TSH 1.565 10/04/2023: ALT 13; BUN 30; Creatinine 1.07; Hemoglobin 14.0; Platelet Count 180; Potassium 4.4; Sodium 134  Recent Lipid Panel    Component Value Date/Time   CHOL 174 03/30/2023 1415   CHOL 103 07/16/2019 0941   TRIG 76 03/30/2023 1415   HDL 59 03/30/2023 1415   HDL 48 07/16/2019 0941   CHOLHDL 2.9 03/30/2023 1415   VLDL 15 03/30/2023 1415   LDLCALC 100 (H) 03/30/2023 1415   LDLCALC 39 07/16/2019 0941   LDLDIRECT 48 07/16/2019 0941     Risk Assessment/Calculations:    CHA2DS2-VASc Score = 5   This indicates a 7.2% annual risk of stroke. The patient's score is based upon: CHF History: 1 HTN History: 1 Diabetes History: 0 Stroke History: 0 Vascular Disease History: 0 Age Score: 2 Gender Score: 1            Physical Exam:    VS:  BP 130/60   Pulse 71   Ht 5' 4 (1.626 m)   Wt 158 lb (71.7 kg)   SpO2 97%   BMI 27.12 kg/m     Wt Readings from Last 3 Encounters:  10/23/23 158 lb (71.7 kg)  10/04/23 157 lb 14.4 oz (71.6 kg)  10/01/23 148 lb 13 oz (67.5 kg)     GEN:  elderly female in NAD HEENT: Normal NECK: No JVD; No carotid bruits LYMPHATICS: No lymphadenopathy CARDIAC:  RRR, 2/6 systolic mumur RESPIRATORY:  Clear to auscultation without rales, wheezing or rhonchi  ABDOMEN: Soft, non-tender, non-distended MUSCULOSKELETAL:  No edema; No deformity  SKIN: Warm and dry NEUROLOGIC:  Alert and oriented x 3 PSYCHIATRIC:  Normal affect   ASSESSMENT:    1. Balance problem   2. Paroxysmal atrial fibrillation (HCC)   3. Chronic anticoagulation   4. Primary hypertension   5. TAVR 09/03/2018: Edwards Sapien 3 THV (size 23 mm, model # 9600TFX, serial # [id number])   6. Chronic diastolic (congestive) heart failure (HCC)    PLAN:    In order of problems listed above:  PAF Chronic anticoagulation - Maintaining sinus rhythm on metoprolol , amiodarone  - No bleeding issues on Eliquis  appropriately dosed at 5 mg twice daily -- clinically in SR today on exam   Hypertension - 2.5 mg amlodipine , 25 mg spironolactone , 25 mg losartan , 50 mg Lopressor  -- she is actually not taking spironolactone , and DBP borderline - with weakness, I will stop this   Aortic stenosis s/p TAVR - Stable valve function on echo   Chronic diastolic heart failure - Grade 2 DD - GDMT:  losartan  -- uses PRN lasix /k -- appears euvolemic today   Balance issues - she wants to return to [facility] PT at [place] - she is cleared to go back, I will send a new referral   [place] She has 4 children including a set of twins and worked on a tobacco farm, father was a administrator, civil service. 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 She has 2 degrees from [employer].   She prefers to follow with Dr. Ganji and would like to be seen in 2 months.            Medication Adjustments/Labs and Tests Ordered: Current medicines are reviewed at length with the patient today.  Concerns regarding medicines are outlined above.  Orders Placed This Encounter  Procedures   Ambulatory referral to Physical Therapy   Meds ordered this encounter   Medications   amiodarone  (PACERONE ) 200 MG tablet    Sig: Take 0.5 tablets (100 mg total) by mouth daily.   furosemide  (LASIX ) 20 MG tablet    Sig: Take 1 tablet (20 mg total) by mouth as needed for edema.   DISCONTD: magnesium  30 MG tablet    Sig: Take 1 tablet (30 mg total) by mouth as needed (TAKE WITH LASIX ).   potassium chloride  (KLOR-CON ) 10 MEQ tablet    Sig: Take 1 tablet (10 mEq total) by mouth as needed (TAKE WITH LASIX  FOR SWELLING).   losartan  (COZAAR ) 25 MG tablet    Sig: Take 1 tablet (25 mg total) by mouth daily.    Dispense:  30 tablet    Refill:  6    Patient Instructions  Medication Instructions:  STOP AMLODIPINE  STOP SPIRONOLACTONE  TAKE LASIX  AND POTASSIUM TOGETHER AND AS NEEDED FOR SWELLING *If you need a refill on your cardiac medications before your next appointment, please call your pharmacy*   Lab Work: NONE  Testing/Procedures: REFERRAL TO PHYSICAL THERAPY-SOMEONE WILL BE CALLING YOU  Follow-Up: At [facility] you and your health needs are our priority.  As part of our continuing mission to provide you with exceptional heart care, we have created designated Provider Care Teams.  These Care Teams include your primary Cardiologist (physician) and Advanced Practice Providers (  APPs -  Physician Assistants and Nurse Practitioners) who all work together to provide you with the care you need, when you need it.  Your next appointment:   2 month(s)  Provider:   Gordy Bergamo, MD  or Jon Hails, PA-C    IF BERGAMO UNAVAILABLE IN 2 MONTHS, Then, Gordy Bergamo, MD will plan to see you again in 4 month(s).         Signed, Jon Nat Hails, [name]  10/23/2023 10:26 AM    [facility]

## 2023-10-12 ENCOUNTER — Ambulatory Visit: Payer: Medicare Other

## 2023-10-12 LAB — MULTIPLE MYELOMA PANEL, SERUM
Albumin SerPl Elph-Mcnc: 3.3 g/dL (ref 2.9–4.4)
Albumin/Glob SerPl: 1.8 — ABNORMAL HIGH (ref 0.7–1.7)
Alpha 1: 0.2 g/dL (ref 0.0–0.4)
Alpha2 Glob SerPl Elph-Mcnc: 0.7 g/dL (ref 0.4–1.0)
B-Globulin SerPl Elph-Mcnc: 0.7 g/dL (ref 0.7–1.3)
Gamma Glob SerPl Elph-Mcnc: 0.3 g/dL — ABNORMAL LOW (ref 0.4–1.8)
Globulin, Total: 1.9 g/dL — ABNORMAL LOW (ref 2.2–3.9)
IgA: 51 mg/dL — ABNORMAL LOW (ref 64–422)
IgG (Immunoglobin G), Serum: 390 mg/dL — ABNORMAL LOW (ref 586–1602)
IgM (Immunoglobulin M), Srm: 25 mg/dL — ABNORMAL LOW (ref 26–217)
Total Protein ELP: 5.2 g/dL — ABNORMAL LOW (ref 6.0–8.5)

## 2023-10-18 ENCOUNTER — Telehealth: Payer: Self-pay | Admitting: Cardiology

## 2023-10-18 ENCOUNTER — Other Ambulatory Visit: Payer: Self-pay | Admitting: *Deleted

## 2023-10-18 MED ORDER — LENALIDOMIDE 10 MG PO CAPS: 10.0000 mg | ORAL_CAPSULE | Freq: Every day | ORAL | 0 refills | Status: DC

## 2023-10-18 NOTE — Telephone Encounter (Signed)
Spoke with patient and she states she went to the ED and would like for you to review the notes. She would like to know if you want her to continue spironolactone and losartan. She was prescribed that in ED. If so she needs refill.  She also would like a referral for PT to continue for her balance issues.

## 2023-10-18 NOTE — Telephone Encounter (Signed)
She has an appointment on 12th 31 2024 with Micah Flesher, PA hence we will wait for that.

## 2023-10-18 NOTE — Telephone Encounter (Signed)
Pt would like to speak with a nurse about getting an appt within the next week. Pt states she needs a referral over for rehab as well. Please advise

## 2023-10-22 NOTE — Telephone Encounter (Signed)
Patient is aware she has appointment tomorrow with Micah Flesher and will discuss medication changes with provider

## 2023-10-22 NOTE — Telephone Encounter (Signed)
Patient is calling back for update on medication and if she is to continue the spironolactone and losartan. She states she has been without for the past 3 days and is unhappy with the lack of communication. Please advise.

## 2023-10-23 ENCOUNTER — Ambulatory Visit: Payer: Medicare Other | Attending: Physician Assistant | Admitting: Physician Assistant

## 2023-10-23 ENCOUNTER — Encounter: Payer: Self-pay | Admitting: Cardiovascular Disease

## 2023-10-23 ENCOUNTER — Encounter: Payer: Self-pay | Admitting: Physician Assistant

## 2023-10-23 VITALS — BP 130/60 | HR 71 | Ht 64.0 in | Wt 158.0 lb

## 2023-10-23 DIAGNOSIS — Z7901 Long term (current) use of anticoagulants: Secondary | ICD-10-CM | POA: Diagnosis not present

## 2023-10-23 DIAGNOSIS — I10 Essential (primary) hypertension: Secondary | ICD-10-CM | POA: Diagnosis not present

## 2023-10-23 DIAGNOSIS — R2689 Other abnormalities of gait and mobility: Secondary | ICD-10-CM | POA: Diagnosis not present

## 2023-10-23 DIAGNOSIS — I480 Paroxysmal atrial fibrillation: Secondary | ICD-10-CM

## 2023-10-23 DIAGNOSIS — I5032 Chronic diastolic (congestive) heart failure: Secondary | ICD-10-CM | POA: Diagnosis not present

## 2023-10-23 DIAGNOSIS — Z952 Presence of prosthetic heart valve: Secondary | ICD-10-CM

## 2023-10-23 MED ORDER — FUROSEMIDE 20 MG PO TABS: 20.0000 mg | ORAL_TABLET | ORAL | Status: DC | PRN

## 2023-10-23 MED ORDER — LOSARTAN POTASSIUM 25 MG PO TABS: 25.0000 mg | ORAL_TABLET | Freq: Every day | ORAL | 6 refills | Status: DC

## 2023-10-23 MED ORDER — AMIODARONE HCL 200 MG PO TABS: 100.0000 mg | ORAL_TABLET | Freq: Every day | ORAL | Status: DC

## 2023-10-23 MED ORDER — MAGNESIUM 30 MG PO TABS: 30.0000 mg | ORAL_TABLET | ORAL | Status: DC | PRN

## 2023-10-23 MED ORDER — POTASSIUM CHLORIDE ER 10 MEQ PO TBCR
10.0000 meq | EXTENDED_RELEASE_TABLET | ORAL | Status: DC | PRN
Start: 2023-10-23 — End: 2024-03-31

## 2023-10-23 NOTE — Patient Instructions (Addendum)
 Medication Instructions:  STOP AMLODIPINE  STOP SPIRONOLACTONE  TAKE LASIX  AND POTASSIUM TOGETHER AND AS NEEDED FOR SWELLING *If you need a refill on your cardiac medications before your next appointment, please call your pharmacy*   Lab Work: NONE  Testing/Procedures: REFERRAL TO PHYSICAL THERAPY-SOMEONE WILL BE CALLING YOU  Follow-Up: At [facility], you and your health needs are our priority.  As part of our continuing mission to provide you with exceptional heart care, we have created designated Provider Care Teams.  These Care Teams include your primary Cardiologist (physician) and Advanced Practice Providers (APPs -  Physician Assistants and Nurse Practitioners) who all work together to provide you with the care you need, when you need it.  Your next appointment:   2 month(s)  Provider:   Gordy Bergamo, MD  or Jon Hails, PA-C    IF BERGAMO UNAVAILABLE IN 2 MONTHS, Then, Gordy Bergamo, MD will plan to see you again in 4 month(s).

## 2023-10-25 ENCOUNTER — Other Ambulatory Visit: Payer: Self-pay | Admitting: Hematology and Oncology

## 2023-11-01 ENCOUNTER — Other Ambulatory Visit: Payer: Self-pay | Admitting: Hematology and Oncology

## 2023-11-01 DIAGNOSIS — C9000 Multiple myeloma not having achieved remission: Secondary | ICD-10-CM

## 2023-11-01 NOTE — Progress Notes (Signed)
 [facility] [phone]   [phone]   PROGRESS NOTE   Patient Care Team: [facility] as PCP - General (Rheumatology)   Hematological/Oncological History # IgG Lambda Multiple Myeloma  06/14/2022: establish care with [employer] [facility] due to anemia. Labs showed M protein 4.9, Kappa 7.2, Lambda 10.8, ratio 0.67 07/13/2022: Bmbx showed Lambda restricted plasma cell neoplasm involving approximately 60% of the cellular marrow by IHC on the biopsy.  08/01/2022: Cycle 1 Day 1 of VRd chemotherapy. (Holding revlimid  initially) 08/21/2022:  Cycle 2 Day 1 of VRd chemotherapy. (Holding revlimid ) 09/04/2022: Cycle 3 Day 1 of VRd chemotherapy. Started revlimid  10/03/2022: Cycle 4 Day 1 of VRd chemotherapy 10/31/2022: Cycle 5 Day 1 of VRd chemotherapy 11/20/2022: Cycle 6 Day 1 of VRd chemotherapy 12/11/2022: Cycle 7 Day 1 of VRd chemotherapy 01/02/2023: Cycle 8 Day 1 of VRd chemotherapy 01/22/2023: Cycle 9 Day 1 of VRd chemotherapy 02/23/2023: Day 1 Cycle 1 of Dara/Rev/Dex 03/23/2023: Day 1 Cycle 2 of Dara/Rev/Dex 05/06/2023: chemotherapy on pause due to chest pain/cardiac/respiratory issues.  05/18/2023: Cycle 3 Day 1 of Dara/Rev/Dex 06/15/2023: Cycle 4 Day 1 of Dara/Rev/Dex 07/13/2023: Cycle 5 Day 1 of Dara/Rev/Dex 08/10/2023: Cycle 6 Day 1 of Dara/Rev/Dex. Transition to maintenance revlimid .     INTERVAL HISTORY: Zoe [name] 89 y.o. female returns to the clinic today for a follow-up visit accompanied by her daughter-in-law. Her last visit was on 09/24/2023. In the interim, she continued on maintenance revlimid .   [name] reports she has been well overall and interim since her last visit.  She is surprised to hear that her hemoglobin has dropped.  She has had no overt signs of bleeding, bruising, or dark stools.  She reports that she is not currently taking any iron containing medications.  She denies any lightheadedness, dizziness, shortness of  breath.  She reports that she did recently stop 2 medications including a fluid pill as recommended by her cardiologist.  She reports that she does have occasional constipation but that her stools are formed.  Otherwise she is not having any other side effects as result of her Revlimid  therapy.  Additionally she has no questions concerns or complaints today. A full 10 point ROS is otherwise negative.  She denies any fevers, chills, sweats, shortness of breath, chest pain or cough.  She has no other complaints.  A full 10 point ROS is otherwise negative.  MEDICAL HISTORY: Past Medical History:  Diagnosis Date   Arthritis    some - per patient   Breast cancer (HCC)    breast cancer / left    Cataract    bilat    GERD (gastroesophageal reflux disease)    History of kidney stones    Hyperlipidemia    Hypertension    Hypothyroidism    Macular degeneration    Left   S/P TAVR (transcatheter aortic valve replacement) 09/03/2018   23 mm Edwards Sapien 3 transcatheter heart valve placed via percutaneous right transfemoral approach    Severe aortic stenosis    Stress incontinence    Thyroid  disease    Tinnitus     ALLERGIES:  is allergic to quinolones, penicillins, and sulfa antibiotics.  MEDICATIONS:  Current Outpatient Medications  Medication Sig Dispense Refill   acyclovir  (ZOVIRAX ) 400 MG tablet TAKE 1 TABLET BY MOUTH TWICE A DAY 180 tablet 1   albuterol  (VENTOLIN  HFA) 108 (90 Base) MCG/ACT inhaler Inhale 2 puffs into the lungs every 6 (six) hours as needed for wheezing or  shortness of breath. (Patient not taking: Reported on 10/23/2023) 8 g 2   amiodarone  (PACERONE ) 200 MG tablet Take 0.5 tablets (100 mg total) by mouth daily.     apixaban  (ELIQUIS ) 5 MG TABS tablet Take 1 tablet (5 mg total) by mouth 2 (two) times daily. 180 tablet 1   Artificial Tear Solution (SOOTHE XP) SOLN Place 1 drop into both eyes every evening.     Cholecalciferol  (VITAMIN D3) 50 MCG (2000 UT) capsule Take 1  capsule (2,000 Units total) by mouth daily.     [employer] VITAMIN B12 1000 MCG tablet TAKE 1 TABLET BY MOUTH EVERY DAY (Patient not taking: Reported on 10/23/2023) 90 tablet 1   docusate sodium  (COLACE) 100 MG capsule Take 100 mg by mouth daily as needed for mild constipation.     furosemide  (LASIX ) 20 MG tablet Take 1 tablet (20 mg total) by mouth as needed for edema.     KLOR-CON  M10 10 MEQ tablet Take 10 mEq by mouth daily.     lenalidomide  (REVLIMID ) 10 MG capsule Take 1 capsule (10 mg total) by mouth daily. [employer] Auth #  [id number] Date Obtained 10/18/23 Take 1 capsule daily for 21 days then none for 7 days 21 capsule 0   levothyroxine  (SYNTHROID , LEVOTHROID) 100 MCG tablet Take 100 mcg by mouth daily before breakfast.  2   losartan  (COZAAR ) 25 MG tablet Take 1 tablet (25 mg total) by mouth daily. 30 tablet 6   metoprolol  tartrate (LOPRESSOR ) 50 MG tablet TAKE 1 TABLET BY MOUTH TWICE A DAY 180 tablet 3   Multiple Vitamins-Minerals (PRESERVISION AREDS) CAPS Take 1 capsule by mouth 2 (two) times daily.     pantoprazole  (PROTONIX ) 40 MG tablet Take 1 tablet (40 mg total) by mouth daily at 6 (six) AM. 30 tablet 0   polyethylene glycol (MIRALAX  / GLYCOLAX ) 17 g packet Take 17 g by mouth daily as needed for mild constipation.     potassium chloride  (KLOR-CON ) 10 MEQ tablet Take 1 tablet (10 mEq total) by mouth as needed (TAKE WITH LASIX  FOR SWELLING).     pravastatin  (PRAVACHOL ) 40 MG tablet Take 40 mg by mouth every evening.     No current facility-administered medications for this visit.    SURGICAL HISTORY:  Past Surgical History:  Procedure Laterality Date   ABDOMINAL HYSTERECTOMY  1970's   BACK SURGERY     BREAST LUMPECTOMY  12/1998   lumpectomy   CARDIAC CATHETERIZATION     CARDIOVERSION N/A 04/18/2023   Procedure: CARDIOVERSION;  Surgeon: Michele Richardson, DO;  Location: [facility] INVASIVE CV LAB;  Service: Cardiovascular;  Laterality: N/A;   CARDIOVERSION N/A 04/20/2023   Procedure: CARDIOVERSION;   Surgeon: Ladona Heinz, MD;  Location[facility] INVASIVE CV LAB;  Service: Cardiovascular;  Laterality: N/A;   EYE SURGERY     cataract surgery bilat    INTRAOPERATIVE TRANSTHORACIC ECHOCARDIOGRAM N/A 09/03/2018   Procedure: INTRAOPERATIVE TRANSTHORACIC ECHOCARDIOGRAM;  Surgeon: Verlin Lonni BIRCH, MD;  Location: [facility] OR;  Service: Open Heart Surgery;  Laterality: N/A;   KYPHOPLASTY N/A 09/07/2022   Procedure: THORACIC EIGHT KYPHOPLASTY;  Surgeon: Burnetta Aures, MD;  Location: [facility] OR;  Service: Orthopedics;  Laterality: N/A;  1 hr Local with [facility] 3 C-Bed   LITHOTRIPSY     Right total knee     2018 Dr. Ernie   RIGHT/LEFT HEART CATH AND CORONARY ANGIOGRAPHY N/A 08/06/2018   Procedure: RIGHT/LEFT HEART CATH AND CORONARY ANGIOGRAPHY;  Surgeon: Ladona Heinz, MD;  Location: [facility] INVASIVE CV  LAB;  Service: Cardiovascular;  Laterality: N/A;   THYROIDECTOMY, PARTIAL  1975   TONSILLECTOMY     as a child - patient not sure of exact date   TOTAL KNEE ARTHROPLASTY Left 03/13/2016   Procedure: TOTAL KNEE ARTHROPLASTY;  Surgeon: Donnice Car, MD;  Location: [facility] ORS;  Service: Orthopedics;  Laterality: Left;   TOTAL KNEE ARTHROPLASTY Right 06/18/2017   Procedure: RIGHT TOTAL KNEE ARTHROPLASTY;  Surgeon: Car Donnice, MD;  Location: [facility] ORS;  Service: Orthopedics;  Laterality: Right;   TRANSCATHETER AORTIC VALVE REPLACEMENT, TRANSFEMORAL N/A 09/03/2018   Procedure: TRANSCATHETER AORTIC VALVE REPLACEMENT, TRANSFEMORAL;  Surgeon: Verlin Lonni BIRCH, MD;  Location: [facility] OR;  Service: Open Heart Surgery;  Laterality: N/A;   REVIEW OF SYSTEMS:   Constitutional: ( - ) fevers, ( - )  chills , ( - ) night sweats Eyes: ( - ) blurriness of vision, ( - ) double vision, ( - ) watery eyes Ears, nose, mouth, throat, and face: ( - ) mucositis, ( - ) sore throat Respiratory: ( -) cough, ( - ) dyspnea, ( - ) wheezes Cardiovascular: ( - ) palpitation, ( - ) chest discomfort, ( +) lower extremity swelling Gastrointestinal:  (  - ) nausea, ( - ) heartburn, ( - ) change in bowel habits Skin: ( - ) abnormal skin rashes Lymphatics: ( - ) new lymphadenopathy, ( - ) easy bruising Neurological: ( - ) numbness, ( - ) tingling, ( - ) new weaknesses Behavioral/Psych: ( - ) mood change, ( - ) new changes  All other systems were reviewed with the patient and are negative.   PHYSICAL EXAMINATION:  There were no vitals taken for this visit.  ECOG PERFORMANCE STATUS: 1  GENERAL: well appearing elderly Caucasian female, alert, no distress and comfortable SKIN: skin color, texture, turgor are normal, no rashes or significant lesions EYES: conjunctiva are pink and non-injected, sclera clear LUNGS:normal breathing effort. Mild wheezing and crackles in lower lobes b/l.  HEART: irregular rhythm ,normal rate and no murmurs.  Musculoskeletal: no cyanosis of digits and no clubbing  PSYCH: alert & oriented x 3, fluent speech NEURO: no focal motor/sensory deficits    LABORATORY DATA: Lab Results  Component Value Date   WBC 6.9 10/04/2023   HGB 14.0 10/04/2023   HCT 41.0 10/04/2023   MCV 95.3 10/04/2023   PLT 180 10/04/2023      Chemistry      Component Value Date/Time   NA 134 (L) 10/04/2023 1301   NA 130 (L) 06/19/2022 1012   K 4.4 10/04/2023 1301   CL 99 10/04/2023 1301   CO2 28 10/04/2023 1301   BUN 30 (H) 10/04/2023 1301   BUN 21 06/19/2022 1012   CREATININE 1.07 (H) 10/04/2023 1301      Component Value Date/Time   CALCIUM  8.8 (L) 10/04/2023 1301   ALKPHOS 77 10/04/2023 1301   AST 13 (L) 10/04/2023 1301   ALT 13 10/04/2023 1301   BILITOT 0.5 10/04/2023 1301       RADIOGRAPHIC STUDIES:  No results found.    ASSESSMENT/PLAN:  Zoe [name] is a 89 y.o. female who presents to the clinic for evaluation for IgG lambda multiple myeloma.   # IgG Lambda Multiple Myeloma  --diagnosis of MM confirmed with bone marrow biopsy showing 60% plasma cells and anemia -- Bone survey shows no lytic lesions,  kidney function is within normal limits.  --08/01/2022 was Cycle 1 Day 1 of VRd  -Dr. Federico discontinued velcade  on  02/05/23 due to rash. She is status post day 15 cycle #9.  -Dr. Federico changed the care plan to Daratumumab , Revlimid , and decadron . She is scheduled for day 1 cycle #1 today  -Started Cycle 1, Day 1 of Dara/Rev/Dex on 02/23/2023.  Plan:  --Labs show white blood cell count white blood cell 4.5, hemoglobin 1.5, MCV 91.9, platelets 135 --last M protein undetectable (4.9 prior to start of therapy). Dara IFE shows no M protein detected. Normalized SFLC.  --continue revlimid  10 mg p.o. daily.  Close monitoring to assure no worsening blood counts. --Strict precautions for bleeding given.  --RTC in 4 weeks for clinic visit.  Can consider transitioning to q. 12-week visits with 4-week labs to be discussed at next visit.  #Dysuria/increased urinary frequency: --Recently completed Keflex  therapy for UTI, still complaining of occasional dysuria and urgency.  #Hypotension/BP/Atrial Fibrillation  --Follows with cardiology, Dr. Ladona -- Recommendations include metoprolol  succinate 25 mg PO daily and  amiodarone  100 mg PO daily --Continue on Eliquis  5 mg BID   #Bilateral lower extremity edema:  --Continue to wear compression stockings.    # Normocytic Anemia #Vitamin B12 Deficiency --Anemia likely driven by multiple myeloma, but may be component of vitamin B12 deficiency as well. --initial labs showed elevated MMA with B12 180 -- Continue vitamin B12 1000 mcg p.o. daily -- Continue to monitor   #Left leg/thigh neuropathy--improving: --Currently on gabapentin  900 mg nightly and 600 mg in the morning --Encouraged to try water  aerobics and stretches.  --Following with Dr. Bobie (ortho) to see if she is a candidate for steroid injections.  -- Patient evaluated by Dr. Buckley thinks that this may be neuropathy worsened by her Velcade  therapy.   #Episode of right UE tremor and speech  abnormality: --Evaluated by Dr. Buckley on 03/20/2023. Presumed etiology had been atrial fibrillation, but vascular disease, atheromatous changes, are also possibly implicated concurrently.  --MRI brain from 03/13/2023 show no acute finding including no evidence of intracranial neoplastic disease. There are chronic small-vessel ischemic changes of the pons, cerebral hemispheric white matter and right cerebellum. --Educated patient to seek immediate evaluate if there are repeat episodes. --No neurological deficits identified per exam today  #Supportive Care -- chemotherapy education complete.  -- port placement not required.  --Awaiting to start Zometa/Xgeva  -- zofran  8mg  q8H PRN and compazine  10mg  PO q6H for nausea -- acyclovir  400mg  PO BID for VCZ prophylaxis -- tylenol  1000 mg q8H PRN for back pain.   No orders of the defined types were placed in this encounter.   Patient expressed understanding of the plan provided.   I have spent a total of 30 minutes minutes of face-to-face and non-face-to-face time, preparing to see the patient, performing a medically appropriate examination, counseling and educating the patient, ordering tests/procedures, documenting clinical information in the electronic health record, and care coordination.   Norleen IVAR Federico, MD Department of Hematology/Oncology [facility] Phone: [phone] Pager: [phone] Email: [email or web address]

## 2023-11-02 ENCOUNTER — Other Ambulatory Visit: Payer: Self-pay | Admitting: *Deleted

## 2023-11-02 ENCOUNTER — Inpatient Hospital Stay: Payer: Medicare Other

## 2023-11-02 ENCOUNTER — Inpatient Hospital Stay: Payer: Medicare Other | Attending: Physician Assistant | Admitting: Hematology and Oncology

## 2023-11-02 VITALS — BP 134/72 | HR 56 | Temp 97.8°F | Resp 16 | Wt 162.4 lb

## 2023-11-02 DIAGNOSIS — G629 Polyneuropathy, unspecified: Secondary | ICD-10-CM | POA: Diagnosis not present

## 2023-11-02 DIAGNOSIS — E538 Deficiency of other specified B group vitamins: Secondary | ICD-10-CM | POA: Insufficient documentation

## 2023-11-02 DIAGNOSIS — C9000 Multiple myeloma not having achieved remission: Secondary | ICD-10-CM | POA: Diagnosis not present

## 2023-11-02 DIAGNOSIS — D649 Anemia, unspecified: Secondary | ICD-10-CM | POA: Insufficient documentation

## 2023-11-02 DIAGNOSIS — R251 Tremor, unspecified: Secondary | ICD-10-CM | POA: Insufficient documentation

## 2023-11-02 DIAGNOSIS — Z7901 Long term (current) use of anticoagulants: Secondary | ICD-10-CM | POA: Diagnosis not present

## 2023-11-02 DIAGNOSIS — I4891 Unspecified atrial fibrillation: Secondary | ICD-10-CM | POA: Insufficient documentation

## 2023-11-02 DIAGNOSIS — I959 Hypotension, unspecified: Secondary | ICD-10-CM | POA: Insufficient documentation

## 2023-11-02 LAB — CBC WITH DIFFERENTIAL (CANCER CENTER ONLY)
Abs Immature Granulocytes: 0.02 10*3/uL (ref 0.00–0.07)
Basophils Absolute: 0.1 10*3/uL (ref 0.0–0.1)
Basophils Relative: 2 %
Eosinophils Absolute: 0.3 10*3/uL (ref 0.0–0.5)
Eosinophils Relative: 7 %
HCT: 34.1 % — ABNORMAL LOW (ref 36.0–46.0)
Hemoglobin: 11.5 g/dL — ABNORMAL LOW (ref 12.0–15.0)
Immature Granulocytes: 0 %
Lymphocytes Relative: 12 %
Lymphs Abs: 0.5 10*3/uL — ABNORMAL LOW (ref 0.7–4.0)
MCH: 31 pg (ref 26.0–34.0)
MCHC: 33.7 g/dL (ref 30.0–36.0)
MCV: 91.9 fL (ref 80.0–100.0)
Monocytes Absolute: 0.9 10*3/uL (ref 0.1–1.0)
Monocytes Relative: 20 %
Neutro Abs: 2.7 10*3/uL (ref 1.7–7.7)
Neutrophils Relative %: 59 %
Platelet Count: 135 10*3/uL — ABNORMAL LOW (ref 150–400)
RBC: 3.71 MIL/uL — ABNORMAL LOW (ref 3.87–5.11)
RDW: 14.6 % (ref 11.5–15.5)
WBC Count: 4.5 10*3/uL (ref 4.0–10.5)
nRBC: 0 % (ref 0.0–0.2)

## 2023-11-02 LAB — FERRITIN: Ferritin: 86 ng/mL (ref 11–307)

## 2023-11-02 LAB — IRON AND IRON BINDING CAPACITY (CC-WL,HP ONLY)
Iron: 40 ug/dL (ref 28–170)
Saturation Ratios: 14 % (ref 10.4–31.8)
TIBC: 284 ug/dL (ref 250–450)
UIBC: 244 ug/dL (ref 148–442)

## 2023-11-02 LAB — CMP (CANCER CENTER ONLY)
ALT: 8 U/L (ref 0–44)
AST: 11 U/L — ABNORMAL LOW (ref 15–41)
Albumin: 3.5 g/dL (ref 3.5–5.0)
Alkaline Phosphatase: 78 U/L (ref 38–126)
Anion gap: 6 (ref 5–15)
BUN: 22 mg/dL (ref 8–23)
CO2: 27 mmol/L (ref 22–32)
Calcium: 8.5 mg/dL — ABNORMAL LOW (ref 8.9–10.3)
Chloride: 104 mmol/L (ref 98–111)
Creatinine: 0.79 mg/dL (ref 0.44–1.00)
GFR, Estimated: >60 mL/min (ref >60)
Glucose, Bld: 105 mg/dL — ABNORMAL HIGH (ref 70–99)
Potassium: 3.4 mmol/L — ABNORMAL LOW (ref 3.5–5.1)
Sodium: 137 mmol/L (ref 135–145)
Total Bilirubin: 0.6 mg/dL (ref 0.0–1.2)
Total Protein: 5.5 g/dL — ABNORMAL LOW (ref 6.5–8.1)

## 2023-11-02 LAB — LACTATE DEHYDROGENASE: LDH: 203 U/L — ABNORMAL HIGH (ref 98–192)

## 2023-11-05 ENCOUNTER — Telehealth: Payer: Self-pay | Admitting: *Deleted

## 2023-11-05 LAB — KAPPA/LAMBDA LIGHT CHAINS
Kappa free light chain: 14.4 mg/L (ref 3.3–19.4)
Kappa, lambda light chain ratio: 1.26 (ref 0.26–1.65)
Lambda free light chains: 11.4 mg/L (ref 5.7–26.3)

## 2023-11-05 NOTE — Telephone Encounter (Signed)
-----   Message from Norleen ONEIDA Kidney IV sent at 11/04/2023  7:19 PM EST ----- Please let Ms. Parthasarathy know that her labs did not show any iron deficiency.  The reason for her low hemoglobin is unclear.  Please schedule her lab visit in 2 weeks time in order to reassess and make sure that her levels do not continue dropping. ----- Message ----- From: Rebecka, [facility] [place] Sent: 11/02/2023  11:20 AM EST To: Norleen ONEIDA Kidney [place], MD

## 2023-11-05 NOTE — Telephone Encounter (Signed)
 TCT patient regarding recent lab results.  Spoke with her. Advised that her labs did not show any iron deficiency. The reason for her low hemoglobin is unclear. Advised that we will schedule her lab visit in 2 weeks time in order to reassess and make sure that her levels do not continue dropping.  Pt voiced understanding.  Scheduling message sent for lab appt in 2 weeks

## 2023-11-06 ENCOUNTER — Ambulatory Visit: Payer: Medicare Other | Admitting: Hematology and Oncology

## 2023-11-06 ENCOUNTER — Other Ambulatory Visit: Payer: Medicare Other

## 2023-11-06 ENCOUNTER — Ambulatory Visit: Payer: Medicare Other

## 2023-11-07 ENCOUNTER — Telehealth: Payer: Self-pay | Admitting: Hematology and Oncology

## 2023-11-07 NOTE — Therapy (Signed)
OUTPATIENT PHYSICAL THERAPY  LOWER EXTREMITY ONCOLOGY TREATMENT  Patient Name: Zoe [name] MRN: [id number] DOB:1934/01/29, 89 y.o., female Today's Date: 11/08/2023  END OF SESSION:  PT End of Session - 11/08/23 0958     Visit Number 15    Number of Visits 27    Date for PT Re-Evaluation 12/20/23    Authorization Type [employer]    PT Start Time 1000    PT Stop Time 1055    PT Time Calculation (min) 55 min    Equipment Utilized During Treatment Gait belt    Activity Tolerance Patient tolerated treatment well;Patient limited by fatigue    Behavior During Therapy WFL for tasks assessed/performed                Past Medical History:  Diagnosis Date   Arthritis    some - per patient   Breast cancer (HCC)    breast cancer / left    Cataract    bilat    GERD (gastroesophageal reflux disease)    History of kidney stones    Hyperlipidemia    Hypertension    Hypothyroidism    Macular degeneration    Left   S/P TAVR (transcatheter aortic valve replacement) 09/03/2018   23 mm Edwards Sapien 3 transcatheter heart valve placed via percutaneous right transfemoral approach    Severe aortic stenosis    Stress incontinence    Thyroid disease    Tinnitus    Past Surgical History:  Procedure Laterality Date   ABDOMINAL HYSTERECTOMY  1970's   BACK SURGERY     BREAST LUMPECTOMY  12/1998   lumpectomy   CARDIAC CATHETERIZATION     CARDIOVERSION N/A 04/18/2023   Procedure: CARDIOVERSION;  Surgeon: Tessa Lerner, DO;  Location: [facility] INVASIVE CV LAB;  Service: Cardiovascular;  Laterality: N/A;   CARDIOVERSION N/A 04/20/2023   Procedure: CARDIOVERSION;  Surgeon: Yates Decamp, MD;  Location: [facility] INVASIVE CV LAB;  Service: Cardiovascular;  Laterality: N/A;   EYE SURGERY     cataract surgery bilat    INTRAOPERATIVE TRANSTHORACIC ECHOCARDIOGRAM N/A 09/03/2018   Procedure: INTRAOPERATIVE TRANSTHORACIC ECHOCARDIOGRAM;  Surgeon: Kathleene Hazel, MD;  Location: [facility] OR;  Service: Open  Heart Surgery;  Laterality: N/A;   KYPHOPLASTY N/A 09/07/2022   Procedure: THORACIC EIGHT KYPHOPLASTY;  Surgeon: Venita Lick, MD;  Location: [facility] OR;  Service: Orthopedics;  Laterality: N/A;  1 hr Local with [facility] 3 C-Bed   LITHOTRIPSY     Right total knee     2018 Dr. Charlann Boxer   RIGHT/LEFT HEART CATH AND CORONARY ANGIOGRAPHY N/A 08/06/2018   Procedure: RIGHT/LEFT HEART CATH AND CORONARY ANGIOGRAPHY;  Surgeon: Yates Decamp, MD;  Location: [facility] INVASIVE CV LAB;  Service: Cardiovascular;  Laterality: N/A;   THYROIDECTOMY, PARTIAL  1975   TONSILLECTOMY     as a child - patient not sure of exact date   TOTAL KNEE ARTHROPLASTY Left 03/13/2016   Procedure: TOTAL KNEE ARTHROPLASTY;  Surgeon: Durene Romans, MD;  Location: [facility] ORS;  Service: Orthopedics;  Laterality: Left;   TOTAL KNEE ARTHROPLASTY Right 06/18/2017   Procedure: RIGHT TOTAL KNEE ARTHROPLASTY;  Surgeon: Durene Romans, MD;  Location: [facility] ORS;  Service: Orthopedics;  Laterality: Right;   TRANSCATHETER AORTIC VALVE REPLACEMENT, TRANSFEMORAL N/A 09/03/2018   Procedure: TRANSCATHETER AORTIC VALVE REPLACEMENT, TRANSFEMORAL;  Surgeon: Kathleene Hazel, MD;  Location: [facility] OR;  Service: Open Heart Surgery;  Laterality: N/A;   Patient Active Problem List   Diagnosis Date Noted   Chronic anticoagulation 10/02/2023  Vasospastic angina (HCC) 10/01/2023   History of coronary vasospasm 10/01/2023   NSTEMI (non-ST elevated myocardial infarction) (HCC) 10/01/2023   Hypertensive urgency 07/16/2023   Urinary tract infection due to Klebsiella species 07/16/2023   Chronic heart failure with preserved ejection fraction (HFpEF) (HCC) 07/16/2023   Hypokalemia 07/16/2023   Hypocalcemia 07/16/2023   Atypical atrial flutter (HCC) 04/19/2023   Acute on chronic diastolic heart failure (HCC) 04/19/2023   Paroxysmal atrial fibrillation with rapid ventricular response (HCC) 04/18/2023   History of stroke 04/16/2023   Fall at home, initial encounter  04/16/2023   Atrial fib/flutter, transient (HCC) 04/16/2023   Paroxysmal atrial fibrillation (HCC) 04/15/2023   Elevated troponin 04/15/2023   Chest pain 04/13/2023   Stroke due to embolism (HCC) 03/20/2023   Chest pain, rule out acute myocardial infarction 02/17/2023   Chemotherapy-induced neuropathy (HCC) 11/28/2022   Pain due to onychomycosis of toenails of both feet 11/08/2022   Multiple myeloma (HCC) 07/24/2022   Chronic diastolic CHF (congestive heart failure) (HCC) 05/16/2022   Symptomatic anemia 05/16/2022   Hyponatremia 04/02/2022   Microcytic anemia 04/02/2022   S/P TAVR (transcatheter aortic valve replacement) 09/03/2018   Hypothyroidism    Hypertension    Hyperlipidemia    GERD (gastroesophageal reflux disease)    S/P right TKA 06/18/2017   Class 1 obesity 03/15/2016   S/P left TKA 03/13/2016   History of breast cancer, DCIS, lumpectomy March 2000 12/13/2011    PCP: Dr. Irena Reichmann  REFERRING PROVIDER: Dr. Jeanie Sewer, IV  REFERRING DIAG: Multiple Myeloma  THERAPY DIAG:  Multiple myeloma without remission (HCC)  Muscle weakness (generalized)  Difficulty walking  Balance problem  ONSET DATE: 07/2022  Rationale for Evaluation and Treatment: Rehabilitation  SUBJECTIVE:                                                                                                                                                                                           SUBJECTIVE STATEMENT:   ZO:[id number] 11/08/23 :pt was last seen in PT on 09/27/2023. Following that she was seen in the ED on 09/30/2022 for  Afib which returned to sinus rhythm with vagal maneuvers and later  Non ST elevated myocardial infarction. She has chronic diastolic CHF, and had a TAVR on 09/03/2018. She has been given approval by cardiology to return to PT with script in chart but will do re-eval with prior ordering physician.I still get dizzy especially in the am before the medications. There is some pain in  the anterior thighs with movement and lying down. It seems to be continuous;it doesn't get better or worse. I can get up better from a  solid chair than a soft chair. I still feel like my balance is off.   eval I need to get my balance better so I can move without fear of falling. Difficult getting up and down from chairs, Some fatigue with walking short distances;ie car into clinic. Reports some foot numbness if she is cold but feels good with socks on. Her grandson lives with her, but she has a home health aid in the day time and the night time for safety PERTINENT HISTORY:  Pt was diagnosed with  IgGMultiple Myeloma in 07/2022 after a bone Marrow biopsy showed 60% plasma cells and anemia. No lytic lesions. She was started on chemotherapy. She had surgery for T 8 compression fracture kyphoplasty on 09/07/2022. She was hospitalized 04/15/2023 for Atrial Fibrillation with cardioversion attempted on 04/18/2023. She had some PT at that time. She has a home health aide that drives her to appts and helps with keeping her house clean And someone at night to help her get to the bathroom  PMH significant for A-fib on Eliquis and amiodarone, hypothyroidism, HTN, breast cancer, multiple myeloma,aortic stenosis status post TAVR, hx of TIA, Macular degeneration, Head aches she thinks related to her eyes PAIN:  PAIN:  Are you having pain? Yes NPRS scale: 7/10 Pain location: legs,bilateral knees Pain orientation: Bilateral  PAIN TYPE: aching and stiff Pain description: Intermittent Aggravating factors: lifting her leg, increased standing, Relieving factors: rest.   PRECAUTIONS: PMH significant of A-fib on Eliquis and amiodarone, hypothyroidism, HTN, Left breast cancer, multiple myeloma,aortic stenosis status post TAVR, hx of TIA, T8 compression fx with kyphoplasty  RED FLAGS: Bowel or bladder incontinence: Yes:   and Compression fracture: Yes: had a kyphoplasty    WEIGHT BEARING RESTRICTIONS: No  FALLS:   Has patient fallen in last 6 months? Yes. Number of falls 1, fell in community garden: no injury , 09/14/2023 Larey Seat in home today  LIVING ENVIRONMENT: Lives with: Lucila Maine lives with her presently Lives in: House/apartment Stairs: No;  Has following equipment at home: Single point cane, Environmental consultant - 4 wheeled, and shower chair  OCCUPATION: Retired  LEISURE: read,visit with friends  PRIOR LEVEL OF FUNCTION: Independent with basic ADLs, Independent with household mobility with device, and Needs assistance with homemaking  PATIENT GOALS: Be able to walk more securely, not be dependent on a walker/rollator   OBJECTIVE:  COGNITION: Overall cognitive status: deficits  PALPATION:   OBSERVATIONS / OTHER ASSESSMENTS: Ambulates with rollator, flexed posture/increased kyphosis  SENSATION: Light touch: deficits   POSTURE: forward head, rounded shoulders, increased thoracic kyphosis  LOWER EXTREMITY STRENGTH:  MMT Right eval RIGHT 09/14/2023  Hip flexion 4- 4+  Hip extension Able to bridge Can bridge  Hip abduction 4- 4+  Hip adduction 4 -seated 4+  Hip internal rotation 3- 3-  Hip external rotation 3+ 3  Knee flexion 4+ 5  Knee extension 4+ 4+ pain  Ankle dorsiflexion 5 5  Ankle plantarflexion    Ankle inversion    Ankle eversion    Great toe extension     (Blank rows = not tested)  MMT LEFT eval LEFT 09/14/2023  Hip flexion 4 4, knee pain  Hip extension Able to bridge Can bridge  Hip abduction 4- 4  Hip adduction 4- 4+  Hip internal rotation 3+ 4-  Hip external rotation 3 3-  Knee flexion 4+ 5  Knee extension 4 4+ pain  Ankle dorsiflexion 5 5  Ankle plantarflexion    Ankle inversion    Ankle eversion  Great toe extension      (Blank rows = not tested)  LYMPHEDEMA ASSESSMENTS:   SURGERY TYPE/DATE: Left lumpectomy 2000  NUMBER OF LYMPH NODES REMOVED: Thinks so, not sure how many  CHEMOTHERAPY: Yes for Multiple Myeloma, No for Breast Cancer  RADIATION:No  for Breast Cancer  HORMONE TREATMENT: Yes for Breast Cancer  INFECTIONS: NO  FUNCTIONAL TESTS:  30 seconds sit to stand test: EVAL: 5 repetitions with use of Ue's 09/14/2023 ; 5 repetitions with minimal use of Ue's, but some knee pain after fall today 11/08/2023: 6 with light use of both hands  Timed up and go: 30.4 seconds with rollator and use of hands to rise from chair 09/14/2023  2 trials 23 seconds, 20 seconds 11/08/2023:    23.01 22.27 seconds with rollator and using hands to rise Four position balance test:  able to maintain DLS feet together x 10 sec, and half tandem with left foot forward x 10 sec. , unable to hold half tandem with right foot forward for greater than 3 seconds, unable to maintain tandem stance bilaterally for more than a few seconds. Did not test SLS 11/07/2023 Half tandem right forward 23 sec, left forward 22 GAIT: Distance walked: Nustep to ramp in front of building Assistive device utilized: Rollator Level of assistance: Modified independence Comments:flexed posture, increased kyphosis    Outcome measure:  TODAY'S TREATMENT:                                                                                                                                          DATE:   O2 99,  HR 57,  BP 135/59 Performed 30 sec sit to stand, TUG and half tandem stance for recert Practiced half tandem stance;pt able to hold bilateral feet forward in half tandem for greater than 20 sec. On first attempt Ambulated 52 ft, rest,and then 90 feet, then seated rest, then 97 feet with SPC in right UE and CGA emphasis on coordinating gait and cane and looking up.seated rest.   09/27/2023  Nu Step seat 8, UE 9 Lev 3 x 8 min to warm up and loosen knees and muscles In parallel bars using left hand only practice ambulating 6 lengths with single arm support Instructed in proper cane placement and gait pattern with step through gait using SPC;pt wearing gait belt and with CGA of  PT; Practiced in parallel bars x 8 lengths and then as pt improved went to gym floor.  Seated rest Practiced on straight aways x 3 and then walked half way to ortho gym with CGA of PT and emphasis on erect posture and proper step through. Seated rest Repeated walking around gym and straight aways. Pt noted to limp slightly on left LE as she increased her distance. She was not aware of this. Also appeared to be hip abd weakness. Fully explained to pt my concerns about her walking  with a cane;intermittent dizziness and fall risk. Advised her to contact pharmacist to see if any of her meds could be causing dizziness. Ishe tells me she has used the rollator atleast a year.  09/14/2023 02 99, HR 68 BP 129/68 Sit to stand :30 seconds : 5 reps with some knee pain TUG: 2 trials  23 sec, 20 sec Half tandem stance able to maintain Bilaterally x 15 sec Full tandem done bilaterally; limited to no greater than 3 sec for 2-3 trials each Tested lower extremity strength bilaterally and reviewed goals with pt.  09/11/23: Therapeutic Exercises Nu Step seat 8, UE 9 Lev 3 x 8 min, 588 steps Vital signs taken after NuStep at pts request, see Vital Signs In // bars with 1# added to each ankle for following: Heel raises x 10 with HH, x 10 without HH High march x 10 B with fingertip assist Low march x 10 with fingertips, SBA SLS on blue oval for bil hip flex and abd x 10 each (still with 1# on ankles) Seated rest after // bars and removed ankle weights Sit to stand with purple cushion in chair 2 x 5, SBA to CGA (1 posterior LOB to chair) In // bars for multiple trials of modified tandem stance bilaterally in varying positions for 10 sec at a time with fingertip support throughout, was able to get to practicing full tandem today which pt has not felt confident enough to try before. Pt ambulated from cancer gym to ortho gym and returned without rest and with rollator; SBA   09/04/23: Therapeutic Exercises Nu  Step seat 8, UE 9 Lev 3 x 8 min, 605 steps In // bars: Heel raises x 10 with HH, x 10 without HH High march x 10 B with fingertip assist Low march x 10 no HH, SBA Vector reaches with handhold x 5 each returning therapist demo Sit to stand with purple cushion in chair 2 x 5, CGA In // bars for multiple trials of modified tandem stance bilaterally in varying positions for 10 sec at a time working towards no HH On mini tramp for following: High march 2 x 5 each with +2 HH, 3 way weight shifting x 30 sec each +2 HH, then 1 trial partial tandem stance each x 30 sec with fingertips Pt ambulated from cancer gym to ortho gym and returned without rest and with rollator; SBA   08/31/23:  SpO2 98%, HR 61 bpm Therapeutic Exercises Nu Step seat 8, UE 9 Lev 3 x 8 min, 546 steps In // bars:Heel raises x 10 with HH, x 10 without HH High march x 10 B with HH Low march x 10 no HH, CGA UE reaches with SBA-CGA x 5 each side encouraging reaching until it becomes challenging Sit to stand with purple cushion in chair 2 x 5, CGA 6 in step ups 2 x 5 bilaterally with HH Foot placement on 6 in step forward 2 x 5 ea with second set no HH and sideways bilaterally x 5 ea, no HH; min VC's for erect posture throughout Pt ambulated from cancer gym to ortho gym and returned without rest and with rollator; SBA       PATIENT EDUCATION:  Access Code: [email or web address] URL: [email or web address] Date: 07/12/2023 Prepared by: Alvira Monday  Exercises - Supine Bridge  - 1 x daily - 7 x weekly - 1 sets - 10 reps - Seated Long Arc Quad  - 1 x daily - 7 x weekly - 1 sets -  10 reps - Standing Hip Abduction with Counter Support  - 1 x daily - 7 x weekly - 1 sets - 10 reps - Seated March  - 1 x daily - 7 x weekly - 1 sets - 10 reps - Standing March with Counter Support  - 1 x daily - 7 x weekly - 1 sets - 10 reps - Standing Heel Raise  - 1 x daily - 7 x weekly - 1 sets - 10 reps - Seated Hip Adduction Isometrics with  Ball  - 1 x daily - 7 x weekly - 1 sets - 10 reps - 5 hold Education details: Discussed POC and treatment interventions including, strength, balance and function Person educated: Patient Education method: Explanation Education comprehension: verbalized understanding  HOME EXERCISE PROGRAM:  Supine Bridge  - 1 x daily - 7 x weekly - 1 sets - 10 reps - Seated Long Arc Quad  - 1 x daily - 7 x weekly - 1 sets - 10 reps - Standing Hip Abduction with Counter Support  - 1 x daily - 7 x weekly - 1 sets - 10 reps - Seated March  - 1 x daily - 7 x weekly - 1 sets - 10 reps - Standing March with Counter Support  - 1 x daily - 7 x weekly - 1 sets - 10 reps - Standing Heel Raise  - 1 x daily - 7 x weekly - 1 sets - 10 reps - Seated Hip Adduction Isometrics with Ball  - 1 x daily - 7 x weekly - 1 sets - 10 reps - 5 hold  ASSESSMENT:  CLINICAL IMPRESSION:  IH:KVQQ 11/08/23 pt was last seen in PT on 09/27/2023. Following that she was seen in the ED on 09/30/2022 for  Afib which returned to sinus rhythm with vagal maneuvers and  Non ST elevated myocardial infarction. She has chronic diastolic CHF, and had a TAVR on 09/03/2018. She has been given approval by cardiology to return to PT with script in chart. She has improved slightly with balance and was able to maintain half tandem stance with each leg forward for greater than 20 seconds.  She is not able to hold tandem stance for greater than a few seconds.Her 30 second sit to stand goal while improved  to 6 is still poor for her age and is also indicative of increased risk for falls. She did quite well coordinating her gait with use of SPC in her right hand today, however, she does fatigue very easily and still requires CGA and gait training for safety at home. While her overall balance and strength has improved, she  continues to demonstrate deficits that would benefit from continuation of skilled therapy to enable her to be safer in her home and community.     OBJECTIVE IMPAIRMENTS: decreased activity tolerance, decreased balance, decreased cognition, decreased endurance, decreased knowledge of condition, difficulty walking, decreased strength, impaired sensation, impaired vision/preception, postural dysfunction, and pain.   ACTIVITY LIMITATIONS: carrying, lifting, standing, squatting, bed mobility, dressing, and locomotion level  PARTICIPATION LIMITATIONS: meal prep, cleaning, laundry, driving, shopping, and community activity  PERSONAL FACTORS: Age and 3+ comorbidities: multiple myeloma,Macular Degeneration , Afib are also affecting patient's functional outcome.   REHAB POTENTIAL: Good  CLINICAL DECISION MAKING: Evolving/moderate complexity  EVALUATION COMPLEXITY: Low   GOALS: Goals reviewed with patient? Yes  SHORT TERM GOALS: Target date: 07/30/2023  Pt will be independent in a HEP for LE/core strength Baseline: Goal status: MET 08/14/2023 2.  Pt will be able to maintain half tandem stance bilaterally x 10 sec Baseline:  Goal status:MET 08/14/2023 3.  Pt will perform 6-7 sit to stands in 30 seconds to decrease fall risk Baseline:  Goal status:MET 10/24, 6 reps with hands 4.  Pt will perform TUG in 26 seconds to decrease fall risk Baseline:   Goal status: MET 10/24   LONG TERM GOALS: Target date: 10/25/2022 Pt will be able to maintain tandem stance for 10 seconds B to demonstrate improved balance Baseline:  Goal status:  In progress  2.  Pt will be able to perform 9 sit to stands in 30 seconds for average score to decrease fall risk Baseline:  Goal status: In Progress, 6 today with use of hands 3.  Pt will be able to perform TUG with rollator in 24 seconds or less for improved gait speed and decreased fall risk Baseline:  Goal status:MET  09/14/2023  4.  Pt will be independent in more advanced HEP Baseline:  Goal status: In Progress  5. Pt will be able to safely ambulate household distances with quad cane  GOAL  STATUS; IN PROGRESS  PLAN:  PT FREQUENCY: 2x/week  PT DURATION: 6 weeks(pt is not able to come week of Nov. 25, 2024  PLANNED INTERVENTIONS: Therapeutic exercises, Therapeutic activity, Neuromuscular re-education, Balance training, Gait training, Patient/Family education, Self Care, and Manual therapy  PLAN FOR NEXT SESSION: Gait, progress strength, balance, function, gait with quad cane at pt request.   Waynette Buttery, PT 11/08/2023, 11:38 AM

## 2023-11-08 ENCOUNTER — Ambulatory Visit: Payer: Medicare Other | Attending: Hematology and Oncology

## 2023-11-08 DIAGNOSIS — R262 Difficulty in walking, not elsewhere classified: Secondary | ICD-10-CM | POA: Diagnosis not present

## 2023-11-08 DIAGNOSIS — R2689 Other abnormalities of gait and mobility: Secondary | ICD-10-CM | POA: Insufficient documentation

## 2023-11-08 DIAGNOSIS — C9000 Multiple myeloma not having achieved remission: Secondary | ICD-10-CM | POA: Diagnosis not present

## 2023-11-08 DIAGNOSIS — M6281 Muscle weakness (generalized): Secondary | ICD-10-CM | POA: Insufficient documentation

## 2023-11-09 LAB — MULTIPLE MYELOMA PANEL, SERUM
Albumin SerPl Elph-Mcnc: 2.9 g/dL (ref 2.9–4.4)
Albumin/Glob SerPl: 1.4 (ref 0.7–1.7)
Alpha 1: 0.3 g/dL (ref 0.0–0.4)
Alpha2 Glob SerPl Elph-Mcnc: 0.8 g/dL (ref 0.4–1.0)
B-Globulin SerPl Elph-Mcnc: 0.8 g/dL (ref 0.7–1.3)
Gamma Glob SerPl Elph-Mcnc: 0.3 g/dL — ABNORMAL LOW (ref 0.4–1.8)
Globulin, Total: 2.1 g/dL — ABNORMAL LOW (ref 2.2–3.9)
IgA: 94 mg/dL (ref 64–422)
IgG (Immunoglobin G), Serum: 431 mg/dL — ABNORMAL LOW (ref 586–1602)
IgM (Immunoglobulin M), Srm: 23 mg/dL — ABNORMAL LOW (ref 26–217)
Total Protein ELP: 5 g/dL — ABNORMAL LOW (ref 6.0–8.5)

## 2023-11-12 ENCOUNTER — Other Ambulatory Visit: Payer: Self-pay | Admitting: *Deleted

## 2023-11-12 MED ORDER — LENALIDOMIDE 10 MG PO CAPS: 10.0000 mg | ORAL_CAPSULE | Freq: Every day | ORAL | 0 refills | Status: DC

## 2023-11-14 ENCOUNTER — Telehealth: Payer: Self-pay | Admitting: Hematology and Oncology

## 2023-11-14 ENCOUNTER — Inpatient Hospital Stay: Payer: Medicare Other

## 2023-11-14 ENCOUNTER — Other Ambulatory Visit: Payer: Self-pay | Admitting: Hematology and Oncology

## 2023-11-14 DIAGNOSIS — C9000 Multiple myeloma not having achieved remission: Secondary | ICD-10-CM

## 2023-11-15 ENCOUNTER — Other Ambulatory Visit: Payer: Self-pay | Admitting: *Deleted

## 2023-11-15 ENCOUNTER — Telehealth: Payer: Self-pay | Admitting: Cardiology

## 2023-11-15 ENCOUNTER — Other Ambulatory Visit: Payer: Self-pay | Admitting: Cardiology

## 2023-11-15 DIAGNOSIS — C9000 Multiple myeloma not having achieved remission: Secondary | ICD-10-CM

## 2023-11-15 DIAGNOSIS — I480 Paroxysmal atrial fibrillation: Secondary | ICD-10-CM

## 2023-11-15 DIAGNOSIS — D649 Anemia, unspecified: Secondary | ICD-10-CM

## 2023-11-15 MED ORDER — APIXABAN 5 MG PO TABS: 5.0000 mg | ORAL_TABLET | Freq: Two times a day (BID) | ORAL | 1 refills | Status: AC

## 2023-11-15 NOTE — Telephone Encounter (Signed)
*  STAT* If patient is at the pharmacy, call can be transferred to refill team.   1. Which medications need to be refilled? (please list name of each medication and dose if known) ELIQUIS 5 MG TABS tablet    2. Would you like to learn more about the convenience, safety, & potential cost savings by using the [facility]?      3. Are you open to using the [facility] (Type [facility]. ).   4. Which pharmacy/location (including street and city if local pharmacy) is medication to be sent to? [id number] - [place], [place] - [address]    5. Do they need a 30 day or 90 day supply? 90  Patient wants to know if there is a reduced price to get medication.

## 2023-11-15 NOTE — Telephone Encounter (Signed)
Prescription refill request for Eliquis received. Indication: Afib  Last office visit: 10/23/23 ([facility])  Scr: 0.79 (11/02/23)  Age: 89 Weight: 73.7kg  Appropriate dose. Refill sent.

## 2023-11-16 ENCOUNTER — Inpatient Hospital Stay: Payer: Medicare Other

## 2023-11-16 DIAGNOSIS — C9000 Multiple myeloma not having achieved remission: Secondary | ICD-10-CM | POA: Diagnosis not present

## 2023-11-16 DIAGNOSIS — I959 Hypotension, unspecified: Secondary | ICD-10-CM | POA: Diagnosis not present

## 2023-11-16 DIAGNOSIS — G629 Polyneuropathy, unspecified: Secondary | ICD-10-CM | POA: Diagnosis not present

## 2023-11-16 DIAGNOSIS — R251 Tremor, unspecified: Secondary | ICD-10-CM | POA: Diagnosis not present

## 2023-11-16 DIAGNOSIS — D649 Anemia, unspecified: Secondary | ICD-10-CM

## 2023-11-16 DIAGNOSIS — Z7901 Long term (current) use of anticoagulants: Secondary | ICD-10-CM | POA: Diagnosis not present

## 2023-11-16 DIAGNOSIS — E538 Deficiency of other specified B group vitamins: Secondary | ICD-10-CM | POA: Diagnosis not present

## 2023-11-16 DIAGNOSIS — I4891 Unspecified atrial fibrillation: Secondary | ICD-10-CM | POA: Diagnosis not present

## 2023-11-16 LAB — CBC WITH DIFFERENTIAL (CANCER CENTER ONLY)
Abs Immature Granulocytes: 0.02 10*3/uL (ref 0.00–0.07)
Basophils Absolute: 0.1 10*3/uL (ref 0.0–0.1)
Basophils Relative: 1 %
Eosinophils Absolute: 0.6 10*3/uL — ABNORMAL HIGH (ref 0.0–0.5)
Eosinophils Relative: 11 %
HCT: 36.5 % (ref 36.0–46.0)
Hemoglobin: 12 g/dL (ref 12.0–15.0)
Immature Granulocytes: 0 %
Lymphocytes Relative: 12 %
Lymphs Abs: 0.7 10*3/uL (ref 0.7–4.0)
MCH: 30.9 pg (ref 26.0–34.0)
MCHC: 32.9 g/dL (ref 30.0–36.0)
MCV: 94.1 fL (ref 80.0–100.0)
Monocytes Absolute: 1.1 10*3/uL — ABNORMAL HIGH (ref 0.1–1.0)
Monocytes Relative: 19 %
Neutro Abs: 3.2 10*3/uL (ref 1.7–7.7)
Neutrophils Relative %: 57 %
Platelet Count: 100 10*3/uL — ABNORMAL LOW (ref 150–400)
RBC: 3.88 MIL/uL (ref 3.87–5.11)
RDW: 14.6 % (ref 11.5–15.5)
WBC Count: 5.7 10*3/uL (ref 4.0–10.5)
nRBC: 0 % (ref 0.0–0.2)

## 2023-11-19 ENCOUNTER — Encounter: Payer: Self-pay | Admitting: Cardiology

## 2023-11-19 ENCOUNTER — Telehealth: Payer: Self-pay | Admitting: *Deleted

## 2023-11-19 ENCOUNTER — Telehealth: Payer: Self-pay | Admitting: Cardiology

## 2023-11-19 NOTE — Telephone Encounter (Signed)
-----   Message from Ulysees Barns IV sent at 11/19/2023 11:46 AM EST ----- Please let Mrs. Cottrill know that her Hgb has improved slightly to 12.0, up from 11.5. Still not clear why it dropped from 14.0. Iron levels are normal. We will continue to monitor her levels with a visit in Feb 2025. ----- Message ----- From: Leory Plowman, Lab In [place] Sent: 11/16/2023  10:25 AM EST To: Jaci Standard, MD

## 2023-11-19 NOTE — Telephone Encounter (Signed)
True, I will send her message on mychart. Once most people meet their deductible for 400-500 the cost is usually 45 for 3 months

## 2023-11-19 NOTE — Telephone Encounter (Signed)
TCT patient regarding recent lab results.  No answer. Was able to leave vmm for pt to return this call to [phone].

## 2023-11-19 NOTE — Telephone Encounter (Signed)
Patient is calling to talk with Dr. Jacinto Halim or nurse.

## 2023-11-19 NOTE — Telephone Encounter (Signed)
Spoke with patient and she states she would like to know if provider can send her 3 bottles eliquis. She states pharmacy advised it will cost $150 but she would like to know if provider have any more bottles to send her first. Advised we can not give a 3 month supply of samples. She stated that's what was given to her thel last time and she would like to speak with her provider.

## 2023-11-20 ENCOUNTER — Ambulatory Visit: Payer: Medicare Other | Attending: Hematology and Oncology

## 2023-11-20 DIAGNOSIS — C9000 Multiple myeloma not having achieved remission: Secondary | ICD-10-CM | POA: Insufficient documentation

## 2023-11-20 DIAGNOSIS — R262 Difficulty in walking, not elsewhere classified: Secondary | ICD-10-CM | POA: Insufficient documentation

## 2023-11-20 DIAGNOSIS — M6281 Muscle weakness (generalized): Secondary | ICD-10-CM | POA: Diagnosis not present

## 2023-11-20 DIAGNOSIS — R2689 Other abnormalities of gait and mobility: Secondary | ICD-10-CM | POA: Insufficient documentation

## 2023-11-20 NOTE — Therapy (Signed)
OUTPATIENT PHYSICAL THERAPY  LOWER EXTREMITY ONCOLOGY TREATMENT  Patient Name: Zoe [name] MRN: [id number] DOB:July 06, 1934, 89 y.o., female Today's Date: 11/20/2023  END OF SESSION:  PT End of Session - 11/20/23 1058     Visit Number 16    Number of Visits 27    Date for PT Re-Evaluation 12/20/23    Authorization Type [employer]    Authorization Time Period 1/16-2/27/2025    Authorization - Visit Number 2    Authorization - Number of Visits 8    PT Start Time 1100    PT Stop Time 1152    PT Time Calculation (min) 52 min    Equipment Utilized During Treatment Gait belt    Activity Tolerance Patient tolerated treatment well;Patient limited by fatigue    Behavior During Therapy WFL for tasks assessed/performed                Past Medical History:  Diagnosis Date   Arthritis    some - per patient   Breast cancer (HCC)    breast cancer / left    Cataract    bilat    GERD (gastroesophageal reflux disease)    History of kidney stones    Hyperlipidemia    Hypertension    Hypothyroidism    Macular degeneration    Left   S/P TAVR (transcatheter aortic valve replacement) 09/03/2018   23 mm Edwards Sapien 3 transcatheter heart valve placed via percutaneous right transfemoral approach    Severe aortic stenosis    Stress incontinence    Thyroid disease    Tinnitus    Past Surgical History:  Procedure Laterality Date   ABDOMINAL HYSTERECTOMY  1970's   BACK SURGERY     BREAST LUMPECTOMY  12/1998   lumpectomy   CARDIAC CATHETERIZATION     CARDIOVERSION N/A 04/18/2023   Procedure: CARDIOVERSION;  Surgeon: Tessa Lerner, DO;  Location: [facility] INVASIVE CV LAB;  Service: Cardiovascular;  Laterality: N/A;   CARDIOVERSION N/A 04/20/2023   Procedure: CARDIOVERSION;  Surgeon: Yates Decamp, MD;  Location: [facility] INVASIVE CV LAB;  Service: Cardiovascular;  Laterality: N/A;   EYE SURGERY     cataract surgery bilat    INTRAOPERATIVE TRANSTHORACIC ECHOCARDIOGRAM N/A 09/03/2018   Procedure:  INTRAOPERATIVE TRANSTHORACIC ECHOCARDIOGRAM;  Surgeon: Kathleene Hazel, MD;  Location: [facility] OR;  Service: Open Heart Surgery;  Laterality: N/A;   KYPHOPLASTY N/A 09/07/2022   Procedure: THORACIC EIGHT KYPHOPLASTY;  Surgeon: Venita Lick, MD;  Location: [facility] OR;  Service: Orthopedics;  Laterality: N/A;  1 hr Local with [facility] 3 C-Bed   LITHOTRIPSY     Right total knee     2018 Dr. Charlann Boxer   RIGHT/LEFT HEART CATH AND CORONARY ANGIOGRAPHY N/A 08/06/2018   Procedure: RIGHT/LEFT HEART CATH AND CORONARY ANGIOGRAPHY;  Surgeon: Yates Decamp, MD;  Location: [facility] INVASIVE CV LAB;  Service: Cardiovascular;  Laterality: N/A;   THYROIDECTOMY, PARTIAL  1975   TONSILLECTOMY     as a child - patient not sure of exact date   TOTAL KNEE ARTHROPLASTY Left 03/13/2016   Procedure: TOTAL KNEE ARTHROPLASTY;  Surgeon: Durene Romans, MD;  Location: [facility] ORS;  Service: Orthopedics;  Laterality: Left;   TOTAL KNEE ARTHROPLASTY Right 06/18/2017   Procedure: RIGHT TOTAL KNEE ARTHROPLASTY;  Surgeon: Durene Romans, MD;  Location: [facility] ORS;  Service: Orthopedics;  Laterality: Right;   TRANSCATHETER AORTIC VALVE REPLACEMENT, TRANSFEMORAL N/A 09/03/2018   Procedure: TRANSCATHETER AORTIC VALVE REPLACEMENT, TRANSFEMORAL;  Surgeon: Kathleene Hazel, MD;  Location: [facility] OR;  Service: Open Heart Surgery;  Laterality: N/A;   Patient Active Problem List   Diagnosis Date Noted   Chronic anticoagulation 10/02/2023   Vasospastic angina (HCC) 10/01/2023   History of coronary vasospasm 10/01/2023   NSTEMI (non-ST elevated myocardial infarction) (HCC) 10/01/2023   Hypertensive urgency 07/16/2023   Urinary tract infection due to Klebsiella species 07/16/2023   Chronic heart failure with preserved ejection fraction (HFpEF) (HCC) 07/16/2023   Hypokalemia 07/16/2023   Hypocalcemia 07/16/2023   Atypical atrial flutter (HCC) 04/19/2023   Acute on chronic diastolic heart failure (HCC) 04/19/2023   Paroxysmal atrial fibrillation with  rapid ventricular response (HCC) 04/18/2023   History of stroke 04/16/2023   Fall at home, initial encounter 04/16/2023   Atrial fib/flutter, transient (HCC) 04/16/2023   Paroxysmal atrial fibrillation (HCC) 04/15/2023   Elevated troponin 04/15/2023   Chest pain 04/13/2023   Stroke due to embolism (HCC) 03/20/2023   Chest pain, rule out acute myocardial infarction 02/17/2023   Chemotherapy-induced neuropathy (HCC) 11/28/2022   Pain due to onychomycosis of toenails of both feet 11/08/2022   Multiple myeloma (HCC) 07/24/2022   Chronic diastolic CHF (congestive heart failure) (HCC) 05/16/2022   Symptomatic anemia 05/16/2022   Hyponatremia 04/02/2022   Microcytic anemia 04/02/2022   S/P TAVR (transcatheter aortic valve replacement) 09/03/2018   Hypothyroidism    Hypertension    Hyperlipidemia    GERD (gastroesophageal reflux disease)    S/P right TKA 06/18/2017   Class 1 obesity 03/15/2016   S/P left TKA 03/13/2016   History of breast cancer, DCIS, lumpectomy March 2000 12/13/2011    PCP: Dr. Irena Reichmann  REFERRING PROVIDER: Dr. Jeanie Sewer, IV  REFERRING DIAG: Multiple Myeloma  THERAPY DIAG:  Multiple myeloma without remission (HCC)  Muscle weakness (generalized)  Difficulty walking  Balance problem  ONSET DATE: 07/2022  Rationale for Evaluation and Treatment: Rehabilitation                                                                                                                                                                                           SUBJECTIVE STATEMENT:   Doing well. Mild pain in the anterior thighs.  UE:AVWU 11/08/23 :pt was last seen in PT on 09/27/2023. Following that she was seen in the ED on 09/30/2022 for  Afib which returned to sinus rhythm with vagal maneuvers and later  Non ST elevated myocardial infarction. She has chronic diastolic CHF, and had a TAVR on 09/03/2018. She has been given approval by cardiology to return to PT with script  in chart but will do re-eval with prior ordering physician.I still get dizzy especially in the am before  the medications. There is some pain in the anterior thighs with movement and lying down. It seems to be continuous;it doesn't get better or worse. I can get up better from a solid chair than a soft chair. I still feel like my balance is off.   eval I need to get my balance better so I can move without fear of falling. Difficult getting up and down from chairs, Some fatigue with walking short distances;ie car into clinic. Reports some foot numbness if she is cold but feels good with socks on. Her grandson lives with her, but she has a home health aid in the day time and the night time for safety PERTINENT HISTORY:  Pt was diagnosed with  IgGMultiple Myeloma in 07/2022 after a bone Marrow biopsy showed 60% plasma cells and anemia. No lytic lesions. She was started on chemotherapy. She had surgery for T 8 compression fracture kyphoplasty on 09/07/2022. She was hospitalized 04/15/2023 for Atrial Fibrillation with cardioversion attempted on 04/18/2023. She had some PT at that time. She has a home health aide that drives her to appts and helps with keeping her house clean And someone at night to help her get to the bathroom  PMH significant for A-fib on Eliquis and amiodarone, hypothyroidism, HTN, breast cancer, multiple myeloma,aortic stenosis status post TAVR, hx of TIA, Macular degeneration, Head aches she thinks related to her eyes PAIN:  PAIN:  Are you having pain? Yes NPRS scale: 4/10 Pain location: legs;anterior thighs Pain orientation: Bilateral  PAIN TYPE: aching and stiff Pain description: Intermittent Aggravating factors: lifting her leg, increased standing, Relieving factors: rest.   PRECAUTIONS: PMH significant of A-fib on Eliquis and amiodarone, hypothyroidism, HTN, Left breast cancer, multiple myeloma,aortic stenosis status post TAVR, hx of TIA, T8 compression fx with  kyphoplasty  RED FLAGS: Bowel or bladder incontinence: Yes:   and Compression fracture: Yes: had a kyphoplasty    WEIGHT BEARING RESTRICTIONS: No  FALLS:  Has patient fallen in last 6 months? Yes. Number of falls 1, fell in community garden: no injury , 09/14/2023 Larey Seat in home today  LIVING ENVIRONMENT: Lives with: Lucila Maine lives with her presently Lives in: House/apartment Stairs: No;  Has following equipment at home: Single point cane, Environmental consultant - 4 wheeled, and shower chair  OCCUPATION: Retired  LEISURE: read,visit with friends  PRIOR LEVEL OF FUNCTION: Independent with basic ADLs, Independent with household mobility with device, and Needs assistance with homemaking  PATIENT GOALS: Be able to walk more securely, not be dependent on a walker/rollator   OBJECTIVE:  COGNITION: Overall cognitive status: deficits  PALPATION:   OBSERVATIONS / OTHER ASSESSMENTS: Ambulates with rollator, flexed posture/increased kyphosis  SENSATION: Light touch: deficits   POSTURE: forward head, rounded shoulders, increased thoracic kyphosis  LOWER EXTREMITY STRENGTH:  MMT Right eval RIGHT 09/14/2023  Hip flexion 4- 4+  Hip extension Able to bridge Can bridge  Hip abduction 4- 4+  Hip adduction 4 -seated 4+  Hip internal rotation 3- 3-  Hip external rotation 3+ 3  Knee flexion 4+ 5  Knee extension 4+ 4+ pain  Ankle dorsiflexion 5 5  Ankle plantarflexion    Ankle inversion    Ankle eversion    Great toe extension     (Blank rows = not tested)  MMT LEFT eval LEFT 09/14/2023  Hip flexion 4 4, knee pain  Hip extension Able to bridge Can bridge  Hip abduction 4- 4  Hip adduction 4- 4+  Hip internal rotation 3+ 4-  Hip external  rotation 3 3-  Knee flexion 4+ 5  Knee extension 4 4+ pain  Ankle dorsiflexion 5 5  Ankle plantarflexion    Ankle inversion    Ankle eversion    Great toe extension      (Blank rows = not tested)  LYMPHEDEMA ASSESSMENTS:   SURGERY TYPE/DATE:  Left lumpectomy 2000  NUMBER OF LYMPH NODES REMOVED: Thinks so, not sure how many  CHEMOTHERAPY: Yes for Multiple Myeloma, No for Breast Cancer  RADIATION:No for Breast Cancer  HORMONE TREATMENT: Yes for Breast Cancer  INFECTIONS: NO  FUNCTIONAL TESTS:  30 seconds sit to stand test: EVAL: 5 repetitions with use of Ue's 09/14/2023 ; 5 repetitions with minimal use of Ue's, but some knee pain after fall today 11/08/2023: 6 with light use of both hands  Timed up and go: 30.4 seconds with rollator and use of hands to rise from chair 09/14/2023  2 trials 23 seconds, 20 seconds 11/08/2023:    23.01 22.27 seconds with rollator and using hands to rise Four position balance test:  able to maintain DLS feet together x 10 sec, and half tandem with left foot forward x 10 sec. , unable to hold half tandem with right foot forward for greater than 3 seconds, unable to maintain tandem stance bilaterally for more than a few seconds. Did not test SLS 11/07/2023 Half tandem right forward 23 sec, left forward 22 GAIT: Distance walked: Nustep to ramp in front of building Assistive device utilized: Rollator Level of assistance: Modified independence Comments:flexed posture, increased kyphosis    Outcome measure:  TODAY'S TREATMENT:                                                                                                                                          DATE:   11/20/2023 02 99%,  HR 55 Pt arrived with her SPC and no walker today. Caregiver walked in with her. Nu Step seat 8, UE 9, L5 x 6 min, 361 steps Heel raises x 10 with HH, x 10 no HH Marching x 10 with HH, x 10 no HH Sidestepping 4 lengths in bar Incline stretch x 3 Half tandem x 1 B, tandem stance x 1 B, to failure Ambulated with SPC 116 ft seated rest, 112 ft seated rest with emphasis on cane placement  and step length , CGA of PT Sit to stand with purple cushion x 10 with CGA Ambulated 1 lap around cancer gym with SPC,  CGA  11/08/2023 O2 99,  HR 57,  BP 135/59 Performed 30 sec sit to stand, TUG and half tandem stance for recert Practiced half tandem stance;pt able to hold bilateral feet forward in half tandem for greater than 20 sec. On first attempt Ambulated 52 ft, rest,and then 90 feet, then seated rest, then 97 feet with SPC in right UE and CGA emphasis on coordinating gait and cane and looking  up.seated rest.   09/27/2023  Nu Step seat 8, UE 9 Lev 3 x 8 min to warm up and loosen knees and muscles In parallel bars using left hand only practice ambulating 6 lengths with single arm support Instructed in proper cane placement and gait pattern with step through gait using SPC;pt wearing gait belt and with CGA of PT; Practiced in parallel bars x 8 lengths and then as pt improved went to gym floor.  Seated rest Practiced on straight aways x 3 and then walked half way to ortho gym with CGA of PT and emphasis on erect posture and proper step through. Seated rest Repeated walking around gym and straight aways. Pt noted to limp slightly on left LE as she increased her distance. She was not aware of this. Also appeared to be hip abd weakness. Fully explained to pt my concerns about her walking with a cane;intermittent dizziness and fall risk. Advised her to contact pharmacist to see if any of her meds could be causing dizziness. Ishe tells me she has used the rollator atleast a year.  09/14/2023 02 99, HR 68 BP 129/68 Sit to stand :30 seconds : 5 reps with some knee pain TUG: 2 trials  23 sec, 20 sec Half tandem stance able to maintain Bilaterally x 15 sec Full tandem done bilaterally; limited to no greater than 3 sec for 2-3 trials each Tested lower extremity strength bilaterally and reviewed goals with pt.  09/11/23: Therapeutic Exercises Nu Step seat 8, UE 9 Lev 3 x 8 min, 588 steps Vital signs taken after NuStep at pts request, see Vital Signs In // bars with 1# added to each ankle for following: Heel  raises x 10 with HH, x 10 without HH High march x 10 B with fingertip assist Low march x 10 with fingertips, SBA SLS on blue oval for bil hip flex and abd x 10 each (still with 1# on ankles) Seated rest after // bars and removed ankle weights Sit to stand with purple cushion in chair 2 x 5, SBA to CGA (1 posterior LOB to chair) In // bars for multiple trials of modified tandem stance bilaterally in varying positions for 10 sec at a time with fingertip support throughout, was able to get to practicing full tandem today which pt has not felt confident enough to try before. Pt ambulated from cancer gym to ortho gym and returned without rest and with rollator; SBA   09/04/23: Therapeutic Exercises Nu Step seat 8, UE 9 Lev 3 x 8 min, 605 steps In // bars: Heel raises x 10 with HH, x 10 without HH High march x 10 B with fingertip assist Low march x 10 no HH, SBA Vector reaches with handhold x 5 each returning therapist demo Sit to stand with purple cushion in chair 2 x 5, CGA In // bars for multiple trials of modified tandem stance bilaterally in varying positions for 10 sec at a time working towards no HH On mini tramp for following: High march 2 x 5 each with +2 HH, 3 way weight shifting x 30 sec each +2 HH, then 1 trial partial tandem stance each x 30 sec with fingertips Pt ambulated from cancer gym to ortho gym and returned without rest and with rollator; SBA   08/31/23:  SpO2 98%, HR 61 bpm Therapeutic Exercises Nu Step seat 8, UE 9 Lev 3 x 8 min, 546 steps In // bars:Heel raises x 10 with HH, x 10 without HH High  march x 10 B with HH Low march x 10 no HH, CGA UE reaches with SBA-CGA x 5 each side encouraging reaching until it becomes challenging Sit to stand with purple cushion in chair 2 x 5, CGA 6 in step ups 2 x 5 bilaterally with HH Foot placement on 6 in step forward 2 x 5 ea with second set no HH and sideways bilaterally x 5 ea, no HH; min VC's for erect posture throughout Pt  ambulated from cancer gym to ortho gym and returned without rest and with rollator; SBA       PATIENT EDUCATION:  Access Code: [email or web address] URL: [email or web address] Date: 07/12/2023 Prepared by: Alvira Monday  Exercises - Supine Bridge  - 1 x daily - 7 x weekly - 1 sets - 10 reps - Seated Long Arc Quad  - 1 x daily - 7 x weekly - 1 sets - 10 reps - Standing Hip Abduction with Counter Support  - 1 x daily - 7 x weekly - 1 sets - 10 reps - Seated March  - 1 x daily - 7 x weekly - 1 sets - 10 reps - Standing March with Counter Support  - 1 x daily - 7 x weekly - 1 sets - 10 reps - Standing Heel Raise  - 1 x daily - 7 x weekly - 1 sets - 10 reps - Seated Hip Adduction Isometrics with Ball  - 1 x daily - 7 x weekly - 1 sets - 10 reps - 5 hold Education details: Discussed POC and treatment interventions including, strength, balance and function Person educated: Patient Education method: Explanation Education comprehension: verbalized understanding  HOME EXERCISE PROGRAM:  Supine Bridge  - 1 x daily - 7 x weekly - 1 sets - 10 reps - Seated Long Arc Quad  - 1 x daily - 7 x weekly - 1 sets - 10 reps - Standing Hip Abduction with Counter Support  - 1 x daily - 7 x weekly - 1 sets - 10 reps - Seated March  - 1 x daily - 7 x weekly - 1 sets - 10 reps - Standing March with Counter Support  - 1 x daily - 7 x weekly - 1 sets - 10 reps - Standing Heel Raise  - 1 x daily - 7 x weekly - 1 sets - 10 reps - Seated Hip Adduction Isometrics with Ball  - 1 x daily - 7 x weekly - 1 sets - 10 reps - 5 hold  ASSESSMENT:  CLINICAL IMPRESSION: Pt is able to maintain tandem stance and half tandem fors lightly longer periods of time. She is improving with proper cane placement and gait, and was able to ambulate without LOB today. Pt still advised that I would like caregiver to hold her arm when she is walking outdoors and indoors   OBJECTIVE IMPAIRMENTS: decreased activity tolerance, decreased  balance, decreased cognition, decreased endurance, decreased knowledge of condition, difficulty walking, decreased strength, impaired sensation, impaired vision/preception, postural dysfunction, and pain.   ACTIVITY LIMITATIONS: carrying, lifting, standing, squatting, bed mobility, dressing, and locomotion level  PARTICIPATION LIMITATIONS: meal prep, cleaning, laundry, driving, shopping, and community activity  PERSONAL FACTORS: Age and 3+ comorbidities: multiple myeloma,Macular Degeneration , Afib are also affecting patient's functional outcome.   REHAB POTENTIAL: Good  CLINICAL DECISION MAKING: Evolving/moderate complexity  EVALUATION COMPLEXITY: Low   GOALS: Goals reviewed with patient? Yes  SHORT TERM GOALS: Target date: 07/30/2023  Pt will be independent in a HEP for  LE/core strength Baseline: Goal status: MET 08/14/2023 2.  Pt will be able to maintain half tandem stance bilaterally x 10 sec Baseline:  Goal status:MET 08/14/2023 3.  Pt will perform 6-7 sit to stands in 30 seconds to decrease fall risk Baseline:  Goal status:MET 10/24, 6 reps with hands 4.  Pt will perform TUG in 26 seconds to decrease fall risk Baseline:   Goal status: MET 10/24   LONG TERM GOALS: Target date: 10/25/2022 Pt will be able to maintain tandem stance for 10 seconds B to demonstrate improved balance Baseline:  Goal status:  In progress  2.  Pt will be able to perform 9 sit to stands in 30 seconds for average score to decrease fall risk Baseline:  Goal status: In Progress, 6 today with use of hands 3.  Pt will be able to perform TUG with rollator in 24 seconds or less for improved gait speed and decreased fall risk Baseline:  Goal status:MET  09/14/2023  4.  Pt will be independent in more advanced HEP Baseline:  Goal status: In Progress  5. Pt will be able to safely ambulate household distances with quad cane  GOAL STATUS; IN PROGRESS  PLAN:  PT FREQUENCY: 2x/week  PT DURATION: 6  weeks(pt is not able to come week of Nov. 25, 2024  PLANNED INTERVENTIONS: Therapeutic exercises, Therapeutic activity, Neuromuscular re-education, Balance training, Gait training, Patient/Family education, Self Care, and Manual therapy  PLAN FOR NEXT SESSION: Gait, progress strength, balance, function, gait with quad cane at pt request.   Waynette Buttery, PT 11/20/2023, 11:59 AM

## 2023-11-21 DIAGNOSIS — I480 Paroxysmal atrial fibrillation: Secondary | ICD-10-CM | POA: Diagnosis not present

## 2023-11-21 DIAGNOSIS — I5032 Chronic diastolic (congestive) heart failure: Secondary | ICD-10-CM | POA: Diagnosis not present

## 2023-11-21 DIAGNOSIS — Z0000 Encounter for general adult medical examination without abnormal findings: Secondary | ICD-10-CM | POA: Diagnosis not present

## 2023-11-21 DIAGNOSIS — C9000 Multiple myeloma not having achieved remission: Secondary | ICD-10-CM | POA: Diagnosis not present

## 2023-11-21 DIAGNOSIS — R059 Cough, unspecified: Secondary | ICD-10-CM | POA: Diagnosis not present

## 2023-11-21 DIAGNOSIS — E7800 Pure hypercholesterolemia, unspecified: Secondary | ICD-10-CM | POA: Diagnosis not present

## 2023-11-21 DIAGNOSIS — E039 Hypothyroidism, unspecified: Secondary | ICD-10-CM | POA: Diagnosis not present

## 2023-11-21 DIAGNOSIS — U071 COVID-19: Secondary | ICD-10-CM | POA: Diagnosis not present

## 2023-11-21 DIAGNOSIS — H3530 Unspecified macular degeneration: Secondary | ICD-10-CM | POA: Diagnosis not present

## 2023-11-21 DIAGNOSIS — Z952 Presence of prosthetic heart valve: Secondary | ICD-10-CM | POA: Diagnosis not present

## 2023-11-21 DIAGNOSIS — J309 Allergic rhinitis, unspecified: Secondary | ICD-10-CM | POA: Diagnosis not present

## 2023-11-21 NOTE — Telephone Encounter (Signed)
Patient has not read message.  I spoke with patient and gave her message from Dr Jacinto Halim.  She was able to pay for Eliquis and has picked up prescription.  She will check with her insurance regarding deductible and possibly dividing payments.

## 2023-11-21 NOTE — Telephone Encounter (Signed)
See my chart message

## 2023-11-23 ENCOUNTER — Telehealth: Payer: Self-pay | Admitting: *Deleted

## 2023-11-23 NOTE — Telephone Encounter (Signed)
RTC to patient following message: she was diagnosed with Covid on Wednesday 11/21/23 by her PCP. Her next appointment at [facility] is 11/30/23. Since it is 10 days from now, she wanted to confirm that she could come to her appt. Advised her that she can come to appt and should wear a mask. She verbalized understanding

## 2023-11-24 DIAGNOSIS — U071 COVID-19: Secondary | ICD-10-CM | POA: Diagnosis not present

## 2023-11-26 ENCOUNTER — Other Ambulatory Visit: Payer: Self-pay

## 2023-11-26 ENCOUNTER — Telehealth: Payer: Self-pay | Admitting: *Deleted

## 2023-11-26 DIAGNOSIS — I480 Paroxysmal atrial fibrillation: Secondary | ICD-10-CM

## 2023-11-26 MED ORDER — AMIODARONE HCL 200 MG PO TABS: 100.0000 mg | ORAL_TABLET | Freq: Every day | ORAL | 3 refills | Status: DC

## 2023-11-26 NOTE — Telephone Encounter (Signed)
Received call from pt advising that she has tested + for Covid on 11/21/23. She is asymptomatic at this time. She wanted to make sure she should/could come for her appts on 11/30/23. As it will be 10 days, advised that she could come if she wanted though she will need to wear a mask. If she would rather postpone her appt, advised we could do that too. She states she will call back once she looks at her calendar.

## 2023-11-28 ENCOUNTER — Telehealth: Payer: Self-pay | Admitting: Cardiology

## 2023-11-28 ENCOUNTER — Encounter (INDEPENDENT_AMBULATORY_CARE_PROVIDER_SITE_OTHER): Payer: Medicare Other | Admitting: Ophthalmology

## 2023-11-28 NOTE — Telephone Encounter (Signed)
 Pt c/o medication issue:  1. Name of Medication:  Azithromycin  250 MG Prednisone 25 MG  2. How are you currently taking this medication (dosage and times per day)?   3. Are you having a reaction (difficulty breathing--STAT)?   4. What is your medication issue?   Patient states she tested positive for COVID last Wednesday and her PCP prescribed these medications but she wants to make sure Dr. Ladona agrees. Patient took 2 Azithromycin  last night and has 4 more to take over the next 4 days. She has concerns because she looked up side effects and saw sudden death. Prednisone is to be taken once daily in the morning for the next 5 days. Please advise.

## 2023-11-28 NOTE — Telephone Encounter (Signed)
 Cannot take Azithromycin . She should change. I spoke to the patient.  She is now aware to let other physicians know that she is on amiodarone .

## 2023-11-29 ENCOUNTER — Inpatient Hospital Stay: Payer: Medicare Other

## 2023-11-29 ENCOUNTER — Telehealth: Payer: Self-pay | Admitting: *Deleted

## 2023-11-29 ENCOUNTER — Inpatient Hospital Stay: Payer: Medicare Other | Admitting: Hematology and Oncology

## 2023-11-29 NOTE — Telephone Encounter (Signed)
 Received vm message from pt. She is still recovering from Covid - diagnosed 10 days ago. She is cancelling her appt  for tomorrow. Will reschedule her in 2 weeks. Scheduling message sent

## 2023-11-30 ENCOUNTER — Inpatient Hospital Stay: Payer: Medicare Other | Admitting: Hematology and Oncology

## 2023-11-30 ENCOUNTER — Inpatient Hospital Stay: Payer: Medicare Other

## 2023-12-04 ENCOUNTER — Ambulatory Visit: Payer: Medicare Other | Attending: Hematology and Oncology

## 2023-12-04 ENCOUNTER — Telehealth: Payer: Self-pay

## 2023-12-04 NOTE — Telephone Encounter (Signed)
Pt is still having symptoms since Covid. She had spoken with Tawni Carnes last week to cancel appts fot this week. Cancelled today and 12/06/2023 and she will plan on returning on Feb 18.

## 2023-12-07 ENCOUNTER — Encounter (INDEPENDENT_AMBULATORY_CARE_PROVIDER_SITE_OTHER): Payer: Medicare Other | Admitting: Ophthalmology

## 2023-12-12 ENCOUNTER — Inpatient Hospital Stay: Payer: Medicare Other

## 2023-12-12 ENCOUNTER — Telehealth: Payer: Self-pay | Admitting: Hematology and Oncology

## 2023-12-12 ENCOUNTER — Inpatient Hospital Stay: Payer: Medicare Other | Admitting: Physician Assistant

## 2023-12-14 ENCOUNTER — Ambulatory Visit: Payer: Medicare Other | Admitting: Cardiology

## 2023-12-17 ENCOUNTER — Other Ambulatory Visit: Payer: Self-pay | Admitting: *Deleted

## 2023-12-17 MED ORDER — LENALIDOMIDE 10 MG PO CAPS: 10.0000 mg | ORAL_CAPSULE | Freq: Every day | ORAL | 0 refills | Status: DC

## 2023-12-17 MED ORDER — ACYCLOVIR 400 MG PO TABS: 400.0000 mg | ORAL_TABLET | Freq: Two times a day (BID) | ORAL | 1 refills | Status: DC

## 2023-12-18 ENCOUNTER — Ambulatory Visit: Payer: Medicare Other | Attending: Hematology and Oncology

## 2023-12-18 DIAGNOSIS — R2689 Other abnormalities of gait and mobility: Secondary | ICD-10-CM

## 2023-12-18 DIAGNOSIS — M542 Cervicalgia: Secondary | ICD-10-CM | POA: Diagnosis not present

## 2023-12-18 DIAGNOSIS — R262 Difficulty in walking, not elsewhere classified: Secondary | ICD-10-CM | POA: Diagnosis not present

## 2023-12-18 DIAGNOSIS — M6281 Muscle weakness (generalized): Secondary | ICD-10-CM | POA: Diagnosis not present

## 2023-12-18 DIAGNOSIS — C9000 Multiple myeloma not having achieved remission: Secondary | ICD-10-CM

## 2023-12-18 NOTE — Therapy (Signed)
 OUTPATIENT PHYSICAL THERAPY  LOWER EXTREMITY ONCOLOGY TREATMENT  Patient Name: Zoe [name] MRN: [id number] DOB:26-Aug-1934, 89 y.o., female Today's Date: 12/18/2023  END OF SESSION:  PT End of Session - 12/18/23 1008     Visit Number 17    Number of Visits 27    Date for PT Re-Evaluation 01/15/24    Authorization Type [employer]    Authorization - Visit Number 3    Authorization - Number of Visits 8    PT Start Time 1008    PT Stop Time 1058    PT Time Calculation (min) 50 min    Activity Tolerance Patient tolerated treatment well;Patient limited by fatigue    Behavior During Therapy [facility] for tasks assessed/performed                Past Medical History:  Diagnosis Date   Arthritis    some - per patient   Breast cancer (HCC)    breast cancer / left    Cataract    bilat    GERD (gastroesophageal reflux disease)    History of kidney stones    Hyperlipidemia    Hypertension    Hypothyroidism    Macular degeneration    Left   S/P TAVR (transcatheter aortic valve replacement) 09/03/2018   23 mm Edwards Sapien 3 transcatheter heart valve placed via percutaneous right transfemoral approach    Severe aortic stenosis    Stress incontinence    Thyroid disease    Tinnitus    Past Surgical History:  Procedure Laterality Date   ABDOMINAL HYSTERECTOMY  1970's   BACK SURGERY     BREAST LUMPECTOMY  12/1998   lumpectomy   CARDIAC CATHETERIZATION     CARDIOVERSION N/A 04/18/2023   Procedure: CARDIOVERSION;  Surgeon: Tessa Lerner, DO;  Location: [facility] INVASIVE CV LAB;  Service: Cardiovascular;  Laterality: N/A;   CARDIOVERSION N/A 04/20/2023   Procedure: CARDIOVERSION;  Surgeon: Yates Decamp, MD;  Location: [facility] INVASIVE CV LAB;  Service: Cardiovascular;  Laterality: N/A;   EYE SURGERY     cataract surgery bilat    INTRAOPERATIVE TRANSTHORACIC ECHOCARDIOGRAM N/A 09/03/2018   Procedure: INTRAOPERATIVE TRANSTHORACIC ECHOCARDIOGRAM;  Surgeon: Kathleene Hazel, MD;  Location: [facility]  OR;  Service: Open Heart Surgery;  Laterality: N/A;   KYPHOPLASTY N/A 09/07/2022   Procedure: THORACIC EIGHT KYPHOPLASTY;  Surgeon: Venita Lick, MD;  Location: [facility] OR;  Service: Orthopedics;  Laterality: N/A;  1 hr Local with [facility] 3 C-Bed   LITHOTRIPSY     Right total knee     2018 Dr. Charlann Boxer   RIGHT/LEFT HEART CATH AND CORONARY ANGIOGRAPHY N/A 08/06/2018   Procedure: RIGHT/LEFT HEART CATH AND CORONARY ANGIOGRAPHY;  Surgeon: Yates Decamp, MD;  Location: [facility] INVASIVE CV LAB;  Service: Cardiovascular;  Laterality: N/A;   THYROIDECTOMY, PARTIAL  1975   TONSILLECTOMY     as a child - patient not sure of exact date   TOTAL KNEE ARTHROPLASTY Left 03/13/2016   Procedure: TOTAL KNEE ARTHROPLASTY;  Surgeon: Durene Romans, MD;  Location: [facility] ORS;  Service: Orthopedics;  Laterality: Left;   TOTAL KNEE ARTHROPLASTY Right 06/18/2017   Procedure: RIGHT TOTAL KNEE ARTHROPLASTY;  Surgeon: Durene Romans, MD;  Location: [facility] ORS;  Service: Orthopedics;  Laterality: Right;   TRANSCATHETER AORTIC VALVE REPLACEMENT, TRANSFEMORAL N/A 09/03/2018   Procedure: TRANSCATHETER AORTIC VALVE REPLACEMENT, TRANSFEMORAL;  Surgeon: Kathleene Hazel, MD;  Location: [facility] OR;  Service: Open Heart Surgery;  Laterality: N/A;   Patient Active Problem List  Diagnosis Date Noted   Chronic anticoagulation 10/02/2023   Vasospastic angina (HCC) 10/01/2023   History of coronary vasospasm 10/01/2023   NSTEMI (non-ST elevated myocardial infarction) (HCC) 10/01/2023   Hypertensive urgency 07/16/2023   Urinary tract infection due to Klebsiella species 07/16/2023   Chronic heart failure with preserved ejection fraction (HFpEF) (HCC) 07/16/2023   Hypokalemia 07/16/2023   Hypocalcemia 07/16/2023   Atypical atrial flutter (HCC) 04/19/2023   Acute on chronic diastolic heart failure (HCC) 04/19/2023   Paroxysmal atrial fibrillation with rapid ventricular response (HCC) 04/18/2023   History of stroke 04/16/2023   Fall at home,  initial encounter 04/16/2023   Atrial fib/flutter, transient (HCC) 04/16/2023   Paroxysmal atrial fibrillation (HCC) 04/15/2023   Elevated troponin 04/15/2023   Chest pain 04/13/2023   Stroke due to embolism (HCC) 03/20/2023   Chest pain, rule out acute myocardial infarction 02/17/2023   Chemotherapy-induced neuropathy (HCC) 11/28/2022   Pain due to onychomycosis of toenails of both feet 11/08/2022   Multiple myeloma (HCC) 07/24/2022   Chronic diastolic CHF (congestive heart failure) (HCC) 05/16/2022   Symptomatic anemia 05/16/2022   Hyponatremia 04/02/2022   Microcytic anemia 04/02/2022   S/P TAVR (transcatheter aortic valve replacement) 09/03/2018   Hypothyroidism    Hypertension    Hyperlipidemia    GERD (gastroesophageal reflux disease)    S/P right TKA 06/18/2017   Class 1 obesity 03/15/2016   S/P left TKA 03/13/2016   History of breast cancer, DCIS, lumpectomy March 2000 12/13/2011    PCP: Dr. Irena Reichmann  REFERRING PROVIDER: Dr. Jeanie Sewer, IV  REFERRING DIAG: Multiple Myeloma  THERAPY DIAG:  Multiple myeloma without remission (HCC)  Muscle weakness (generalized)  Difficulty walking  Balance problem  Neck pain  ONSET DATE: 07/2022  Rationale for Evaluation and Treatment: Rehabilitation                                                                                                                                                                                           SUBJECTIVE STATEMENT:   Pt has been sick with Covid for several weeks and then missed appts for bad weather. She reports today increased left neck pain greater than right since her illness. Its worse in the morning. She has been using a heating pad which hasn't helped much. It has been present for a week. She is generally fatigued from her illness. She tried to do some of er HEP but wasn't able to do much.  JY:NWGN 11/08/23 :pt was last seen in PT on 09/27/2023. Following that she was seen in  the ED on 09/30/2022 for  Afib which returned to sinus rhythm  with vagal maneuvers and later  Non ST elevated myocardial infarction. She has chronic diastolic CHF, and had a TAVR on 09/03/2018. She has been given approval by cardiology to return to PT with script in chart but will do re-eval with prior ordering physician.I still get dizzy especially in the am before the medications. There is some pain in the anterior thighs with movement and lying down. It seems to be continuous;it doesn't get better or worse. I can get up better from a solid chair than a soft chair. I still feel like my balance is off.   eval I need to get my balance better so I can move without fear of falling. Difficult getting up and down from chairs, Some fatigue with walking short distances;ie car into clinic. Reports some foot numbness if she is cold but feels good with socks on. Her grandson lives with her, but she has a home health aid in the day time and the night time for safety PERTINENT HISTORY:  Pt was diagnosed with  IgGMultiple Myeloma in 07/2022 after a bone Marrow biopsy showed 60% plasma cells and anemia. No lytic lesions. She was started on chemotherapy. She had surgery for T 8 compression fracture kyphoplasty on 09/07/2022. She was hospitalized 04/15/2023 for Atrial Fibrillation with cardioversion attempted on 04/18/2023. She had some PT at that time. She has a home health aide that drives her to appts and helps with keeping her house clean And someone at night to help her get to the bathroom  PMH significant for A-fib on Eliquis and amiodarone, hypothyroidism, HTN, breast cancer, multiple myeloma,aortic stenosis status post TAVR, hx of TIA, Macular degeneration, Head aches she thinks related to her eyes PAIN:  PAIN:  Are you having pain? Yes NPRS scale: 8-9/10 Pain location: left neck Pain orientation: Bilateral , left greater than right PAIN TYPE: sharp, constant Pain description: Aggravating factors: relieving  factors: rest.   PRECAUTIONS: PMH significant of A-fib on Eliquis and amiodarone, hypothyroidism, HTN, Left breast cancer, multiple myeloma,aortic stenosis status post TAVR, hx of TIA, T8 compression fx with kyphoplasty  RED FLAGS: Bowel or bladder incontinence: Yes:   and Compression fracture: Yes: had a kyphoplasty    WEIGHT BEARING RESTRICTIONS: No  FALLS:  Has patient fallen in last 6 months? Yes. Number of falls 1, fell in community garden: no injury , 09/14/2023 Larey Seat in home today  LIVING ENVIRONMENT: Lives with: Lucila Maine lives with her presently Lives in: House/apartment Stairs: No;  Has following equipment at home: Single point cane, Environmental consultant - 4 wheeled, and shower chair  OCCUPATION: Retired  LEISURE: read,visit with friends  PRIOR LEVEL OF FUNCTION: Independent with basic ADLs, Independent with household mobility with device, and Needs assistance with homemaking  PATIENT GOALS: Be able to walk more securely, not be dependent on a walker/rollator   OBJECTIVE:  COGNITION: Overall cognitive status: deficits  PALPATION:   OBSERVATIONS / OTHER ASSESSMENTS: Ambulates with rollator, flexed posture/increased kyphosis  SENSATION: Light touch: deficits   POSTURE: forward head, rounded shoulders, increased thoracic kyphosis  CERVICAL AROM: Flexion WNL, NP,right ROT Dec. 50% pain on left, Left Rot dec 60% pain on left,Right SB Dec 60% pain/tight, left SB dec 90% pain Tender B UT, posterior cervicals suboccipitals greatest on the left  LOWER EXTREMITY STRENGTH:   Right RotMMT Right eval RIGHT 09/14/2023  Hip flexion 4- 4+  Hip extension Able to bridge Can bridge  Hip abduction 4- 4+  Hip adduction 4 -seated 4+  Hip internal rotation 3- 3-  Hip external rotation 3+ 3  Knee flexion 4+ 5  Knee extension 4+ 4+ pain  Ankle dorsiflexion 5 5  Ankle plantarflexion    Ankle inversion    Ankle eversion    Great toe extension     (Blank rows = not tested)  MMT  LEFT eval LEFT 09/14/2023  Hip flexion 4 4, knee pain  Hip extension Able to bridge Can bridge  Hip abduction 4- 4  Hip adduction 4- 4+  Hip internal rotation 3+ 4-  Hip external rotation 3 3-  Knee flexion 4+ 5  Knee extension 4 4+ pain  Ankle dorsiflexion 5 5  Ankle plantarflexion    Ankle inversion    Ankle eversion    Great toe extension      (Blank rows = not tested)  LYMPHEDEMA ASSESSMENTS:   SURGERY TYPE/DATE: Left lumpectomy 2000  NUMBER OF LYMPH NODES REMOVED: Thinks so, not sure how many  CHEMOTHERAPY: Yes for Multiple Myeloma, No for Breast Cancer  RADIATION:No for Breast Cancer  HORMONE TREATMENT: Yes for Breast Cancer  INFECTIONS: NO  FUNCTIONAL TESTS:  30 seconds sit to stand test: EVAL: 5 repetitions with use of Ue's 09/14/2023 ; 5 repetitions with minimal use of Ue's, but some knee pain after fall today 11/08/2023: 6 with light use of both hands 12/18/2023  5 repetitions with use of both hands  Timed up and go: 30.4 seconds with rollator and use of hands to rise from chair 09/14/2023  2 trials 23 seconds, 20 seconds 11/08/2023:    23.01 22.27 seconds with rollator and using hands to rise 12/18/2023 Avg of 2 trials 24.2 seconds  Four position balance test:  able to maintain DLS feet together x 10 sec, and half tandem with left foot forward x 10 sec. , unable to hold half tandem with right foot forward for greater than 3 seconds, unable to maintain tandem stance bilaterally for more than a few seconds. Did not test SLS 11/07/2023 Half tandem right forward 23 sec, left forward 22 GAIT: Distance walked: Nustep to ramp in front of building Assistive device utilized: Rollator Level of assistance: Modified independence Comments:flexed posture, increased kyphosis    Outcome measure:  TODAY'S TREATMENT:                                                                                                                                          DATE:   12/18/2023 O2 98%, HR 66 30 sec sit to stand: 5 reps TUG; 2 trial avg 24.2 sec Assessed cervical ROM for recert Messaged MD to add cervical referral Nu step x 4 min seat 8, UE 9 Lev 4 x 4  min stopping due to pt fatigue Educated in importance of erect postrue for decreasing neck pain. Educated in towel roll for pillow, and lumbar roll for sitting to improve posture. VC to maintain good posture with head and neck  throughout treatment STM to bilateral cervical region with cocoa butter with pt sitting in chair. Felt good but no overall improvement.   11/20/2023 02 99%,  HR 55 Pt arrived with her SPC and no walker today. Caregiver walked in with her. Nu Step seat 8, UE 9, L5 x 6 min, 361 steps Heel raises x 10 with HH, x 10 no HH Marching x 10 with HH, x 10 no HH Sidestepping 4 lengths in bar Incline stretch x 3 Half tandem x 1 B, tandem stance x 1 B, to failure Ambulated with SPC 116 ft seated rest, 112 ft seated rest with emphasis on cane placement  and step length , CGA of PT Sit to stand with purple cushion x 10 with CGA Ambulated 1 lap around cancer gym with SPC, CGA  11/08/2023 O2 99,  HR 57,  BP 135/59 Performed 30 sec sit to stand, TUG and half tandem stance for recert Practiced half tandem stance;pt able to hold bilateral feet forward in half tandem for greater than 20 sec. On first attempt Ambulated 52 ft, rest,and then 90 feet, then seated rest, then 97 feet with SPC in right UE and CGA emphasis on coordinating gait and cane and looking up.seated rest.   09/27/2023  Nu Step seat 8, UE 9 Lev 3 x 8 min to warm up and loosen knees and muscles In parallel bars using left hand only practice ambulating 6 lengths with single arm support Instructed in proper cane placement and gait pattern with step through gait using SPC;pt wearing gait belt and with CGA of PT; Practiced in parallel bars x 8 lengths and then as pt improved went to gym floor.  Seated rest Practiced on straight aways  x 3 and then walked half way to ortho gym with CGA of PT and emphasis on erect posture and proper step through. Seated rest Repeated walking around gym and straight aways. Pt noted to limp slightly on left LE as she increased her distance. She was not aware of this. Also appeared to be hip abd weakness. Fully explained to pt my concerns about her walking with a cane;intermittent dizziness and fall risk. Advised her to contact pharmacist to see if any of her meds could be causing dizziness. Ishe tells me she has used the rollator atleast a year.  09/14/2023 02 99, HR 68 BP 129/68 Sit to stand :30 seconds : 5 reps with some knee pain TUG: 2 trials  23 sec, 20 sec Half tandem stance able to maintain Bilaterally x 15 sec Full tandem done bilaterally; limited to no greater than 3 sec for 2-3 trials each Tested lower extremity strength bilaterally and reviewed goals with pt.  09/11/23: Therapeutic Exercises Nu Step seat 8, UE 9 Lev 3 x 8 min, 588 steps Vital signs taken after NuStep at pts request, see Vital Signs In // bars with 1# added to each ankle for following: Heel raises x 10 with HH, x 10 without HH High march x 10 B with fingertip assist Low march x 10 with fingertips, SBA SLS on blue oval for bil hip flex and abd x 10 each (still with 1# on ankles) Seated rest after // bars and removed ankle weights Sit to stand with purple cushion in chair 2 x 5, SBA to CGA (1 posterior LOB to chair) In // bars for multiple trials of modified tandem stance bilaterally in varying positions for 10 sec at a time with fingertip support throughout, was able to get to practicing  full tandem today which pt has not felt confident enough to try before. Pt ambulated from cancer gym to ortho gym and returned without rest and with rollator; SBA   09/04/23: Therapeutic Exercises Nu Step seat 8, UE 9 Lev 3 x 8 min, 605 steps In // bars: Heel raises x 10 with HH, x 10 without HH High march x 10 B with  fingertip assist Low march x 10 no HH, SBA Vector reaches with handhold x 5 each returning therapist demo Sit to stand with purple cushion in chair 2 x 5, CGA In // bars for multiple trials of modified tandem stance bilaterally in varying positions for 10 sec at a time working towards no HH On mini tramp for following: High march 2 x 5 each with +2 HH, 3 way weight shifting x 30 sec each +2 HH, then 1 trial partial tandem stance each x 30 sec with fingertips Pt ambulated from cancer gym to ortho gym and returned without rest and with rollator; SBA   08/31/23:  SpO2 98%, HR 61 bpm Therapeutic Exercises Nu Step seat 8, UE 9 Lev 3 x 8 min, 546 steps In // bars:Heel raises x 10 with HH, x 10 without HH High march x 10 B with HH Low march x 10 no HH, CGA UE reaches with SBA-CGA x 5 each side encouraging reaching until it becomes challenging Sit to stand with purple cushion in chair 2 x 5, CGA 6 in step ups 2 x 5 bilaterally with HH Foot placement on 6 in step forward 2 x 5 ea with second set no HH and sideways bilaterally x 5 ea, no HH; min VC's for erect posture throughout Pt ambulated from cancer gym to ortho gym and returned without rest and with rollator; SBA       PATIENT EDUCATION:  Access Code: [email or web address] URL: [email or web address] Date: 07/12/2023 Prepared by: Alvira Monday  Exercises - Supine Bridge  - 1 x daily - 7 x weekly - 1 sets - 10 reps - Seated Long Arc Quad  - 1 x daily - 7 x weekly - 1 sets - 10 reps - Standing Hip Abduction with Counter Support  - 1 x daily - 7 x weekly - 1 sets - 10 reps - Seated March  - 1 x daily - 7 x weekly - 1 sets - 10 reps - Standing March with Counter Support  - 1 x daily - 7 x weekly - 1 sets - 10 reps - Standing Heel Raise  - 1 x daily - 7 x weekly - 1 sets - 10 reps - Seated Hip Adduction Isometrics with Ball  - 1 x daily - 7 x weekly - 1 sets - 10 reps - 5 hold Education details: Discussed POC and treatment interventions  including, strength, balance and function Person educated: Patient Education method: Explanation Education comprehension: verbalized understanding  HOME EXERCISE PROGRAM:  Supine Bridge  - 1 x daily - 7 x weekly - 1 sets - 10 reps - Seated Long Arc Quad  - 1 x daily - 7 x weekly - 1 sets - 10 reps - Standing Hip Abduction with Counter Support  - 1 x daily - 7 x weekly - 1 sets - 10 reps - Seated March  - 1 x daily - 7 x weekly - 1 sets - 10 reps - Standing March with Counter Support  - 1 x daily - 7 x weekly - 1 sets - 10 reps - Standing Heel  Raise  - 1 x daily - 7 x weekly - 1 sets - 10 reps - Seated Hip Adduction Isometrics with Ball  - 1 x daily - 7 x weekly - 1 sets - 10 reps - 5 hold  ASSESSMENT:  CLINICAL IMPRESSION: Pt was ill with Covid for nearly 3 weeks, then missed several session due to bad weather. She has regressed with her 30 sec sit to stand and her TUG since last visit. She has also developed some fairly significant neck pain left greater than right about a week ago after her illness with limitations in all ROM and pain reproduced with most. Erect posture does tend to improve her neck pain.  Her endurance on the Nu step was also limited today.Due to her illness and bad weather we have not been able to progress her as we would like.  She is still a significant fall risk.She will benefit from regularly scheduled skilled PT to continue to address deficits to enable her to achieve her LTG goals.   OBJECTIVE IMPAIRMENTS: decreased activity tolerance, decreased balance, decreased cognition, decreased endurance, decreased knowledge of condition, difficulty walking, decreased strength, impaired sensation, impaired vision/preception, postural dysfunction, and pain.   ACTIVITY LIMITATIONS: carrying, lifting, standing, squatting, bed mobility, dressing, and locomotion level  PARTICIPATION LIMITATIONS: meal prep, cleaning, laundry, driving, shopping, and community activity  PERSONAL  FACTORS: Age and 3+ comorbidities: multiple myeloma,Macular Degeneration , Afib are also affecting patient's functional outcome.   REHAB POTENTIAL: Good  CLINICAL DECISION MAKING: Evolving/moderate complexity  EVALUATION COMPLEXITY: Low   GOALS: Goals reviewed with patient? Yes  SHORT TERM GOALS: Target date: 07/30/2023  Pt will be independent in a HEP for LE/core strength Baseline: Goal status: MET 08/14/2023 2.  Pt will be able to maintain half tandem stance bilaterally x 10 sec Baseline:  Goal status:MET 08/14/2023 3.  Pt will perform 6-7 sit to stands in 30 seconds to decrease fall risk Baseline:  Goal status:MET 10/24, 6 reps with hands 4.  Pt will perform TUG in 26 seconds to decrease fall risk Baseline:   Goal status: MET 10/24   LONG TERM GOALS: Target date: 10/25/2022 Pt will be able to maintain tandem stance for 10 seconds B to demonstrate improved balance Baseline:  Goal status:  In progress  2.  Pt will be able to perform 9 sit to stands in 30 seconds for average score to decrease fall risk Baseline:  Goal status: In Progress, 6 today with use of hands 3.  Pt will be able to perform TUG with rollator in 24 seconds or less for improved gait speed and decreased fall risk Baseline:  Goal status:MET  09/14/2023  4.  Pt will be independent in more advanced HEP Baseline:  Goal status: In Progress  5. Pt will be able to safely ambulate household distances with quad cane  GOAL STATUS; IN PROGRESS  PLAN:  PT FREQUENCY: 2x/week  PT DURATION: 4 weeks  PLANNED INTERVENTIONS: Therapeutic exercises, Therapeutic activity, Neuromuscular re-education, Balance training, Gait training, Patient/Family education, Self Care, and Manual therapy  PLAN FOR NEXT SESSION: Postural education,Gait, progress strength, balance, function, gait with quad cane at pt request.Cervical treatment as required and approved   Waynette Buttery, PT 12/18/2023, 1:26 PM

## 2023-12-19 ENCOUNTER — Encounter (INDEPENDENT_AMBULATORY_CARE_PROVIDER_SITE_OTHER): Payer: Medicare Other | Admitting: Ophthalmology

## 2023-12-19 DIAGNOSIS — H35033 Hypertensive retinopathy, bilateral: Secondary | ICD-10-CM | POA: Diagnosis not present

## 2023-12-19 DIAGNOSIS — H353231 Exudative age-related macular degeneration, bilateral, with active choroidal neovascularization: Secondary | ICD-10-CM

## 2023-12-19 DIAGNOSIS — I10 Essential (primary) hypertension: Secondary | ICD-10-CM | POA: Diagnosis not present

## 2023-12-19 DIAGNOSIS — H43813 Vitreous degeneration, bilateral: Secondary | ICD-10-CM | POA: Diagnosis not present

## 2023-12-20 ENCOUNTER — Ambulatory Visit: Payer: Medicare Other

## 2023-12-20 DIAGNOSIS — R2689 Other abnormalities of gait and mobility: Secondary | ICD-10-CM

## 2023-12-20 DIAGNOSIS — C9000 Multiple myeloma not having achieved remission: Secondary | ICD-10-CM | POA: Diagnosis not present

## 2023-12-20 DIAGNOSIS — M6281 Muscle weakness (generalized): Secondary | ICD-10-CM | POA: Diagnosis not present

## 2023-12-20 DIAGNOSIS — R262 Difficulty in walking, not elsewhere classified: Secondary | ICD-10-CM | POA: Diagnosis not present

## 2023-12-20 DIAGNOSIS — M542 Cervicalgia: Secondary | ICD-10-CM | POA: Diagnosis not present

## 2023-12-20 NOTE — Therapy (Signed)
 OUTPATIENT PHYSICAL THERAPY  LOWER EXTREMITY ONCOLOGY TREATMENT  Patient Name: Zoe [name] MRN: [id number] DOB:1934/03/27, 89 y.o., female Today's Date: 12/20/2023  END OF SESSION:  PT End of Session - 12/20/23 1001     Visit Number 18    Number of Visits 27    Date for PT Re-Evaluation 01/15/24    Authorization Type [employer]    Authorization - Visit Number 4    Authorization - Number of Visits 8    PT Start Time 1002    PT Stop Time 1054    PT Time Calculation (min) 52 min    Equipment Utilized During Treatment Gait belt    Activity Tolerance Patient tolerated treatment well;Patient limited by fatigue    Behavior During Therapy WFL for tasks assessed/performed                Past Medical History:  Diagnosis Date   Arthritis    some - per patient   Breast cancer (HCC)    breast cancer / left    Cataract    bilat    GERD (gastroesophageal reflux disease)    History of kidney stones    Hyperlipidemia    Hypertension    Hypothyroidism    Macular degeneration    Left   S/P TAVR (transcatheter aortic valve replacement) 09/03/2018   23 mm Edwards Sapien 3 transcatheter heart valve placed via percutaneous right transfemoral approach    Severe aortic stenosis    Stress incontinence    Thyroid disease    Tinnitus    Past Surgical History:  Procedure Laterality Date   ABDOMINAL HYSTERECTOMY  1970's   BACK SURGERY     BREAST LUMPECTOMY  12/1998   lumpectomy   CARDIAC CATHETERIZATION     CARDIOVERSION N/A 04/18/2023   Procedure: CARDIOVERSION;  Surgeon: Tessa Lerner, DO;  Location: [facility] INVASIVE CV LAB;  Service: Cardiovascular;  Laterality: N/A;   CARDIOVERSION N/A 04/20/2023   Procedure: CARDIOVERSION;  Surgeon: Yates Decamp, MD;  Location: [facility] INVASIVE CV LAB;  Service: Cardiovascular;  Laterality: N/A;   EYE SURGERY     cataract surgery bilat    INTRAOPERATIVE TRANSTHORACIC ECHOCARDIOGRAM N/A 09/03/2018   Procedure: INTRAOPERATIVE TRANSTHORACIC ECHOCARDIOGRAM;   Surgeon: Kathleene Hazel, MD;  Location: [facility] OR;  Service: Open Heart Surgery;  Laterality: N/A;   KYPHOPLASTY N/A 09/07/2022   Procedure: THORACIC EIGHT KYPHOPLASTY;  Surgeon: Venita Lick, MD;  Location: [facility] OR;  Service: Orthopedics;  Laterality: N/A;  1 hr Local with [facility] 3 C-Bed   LITHOTRIPSY     Right total knee     2018 Dr. Charlann Boxer   RIGHT/LEFT HEART CATH AND CORONARY ANGIOGRAPHY N/A 08/06/2018   Procedure: RIGHT/LEFT HEART CATH AND CORONARY ANGIOGRAPHY;  Surgeon: Yates Decamp, MD;  Location: [facility] INVASIVE CV LAB;  Service: Cardiovascular;  Laterality: N/A;   THYROIDECTOMY, PARTIAL  1975   TONSILLECTOMY     as a child - patient not sure of exact date   TOTAL KNEE ARTHROPLASTY Left 03/13/2016   Procedure: TOTAL KNEE ARTHROPLASTY;  Surgeon: Durene Romans, MD;  Location: [facility] ORS;  Service: Orthopedics;  Laterality: Left;   TOTAL KNEE ARTHROPLASTY Right 06/18/2017   Procedure: RIGHT TOTAL KNEE ARTHROPLASTY;  Surgeon: Durene Romans, MD;  Location: [facility] ORS;  Service: Orthopedics;  Laterality: Right;   TRANSCATHETER AORTIC VALVE REPLACEMENT, TRANSFEMORAL N/A 09/03/2018   Procedure: TRANSCATHETER AORTIC VALVE REPLACEMENT, TRANSFEMORAL;  Surgeon: Kathleene Hazel, MD;  Location: [facility] OR;  Service: Open Heart Surgery;  Laterality:  N/A;   Patient Active Problem List   Diagnosis Date Noted   Chronic anticoagulation 10/02/2023   Vasospastic angina (HCC) 10/01/2023   History of coronary vasospasm 10/01/2023   NSTEMI (non-ST elevated myocardial infarction) (HCC) 10/01/2023   Hypertensive urgency 07/16/2023   Urinary tract infection due to Klebsiella species 07/16/2023   Chronic heart failure with preserved ejection fraction (HFpEF) (HCC) 07/16/2023   Hypokalemia 07/16/2023   Hypocalcemia 07/16/2023   Atypical atrial flutter (HCC) 04/19/2023   Acute on chronic diastolic heart failure (HCC) 04/19/2023   Paroxysmal atrial fibrillation with rapid ventricular response (HCC) 04/18/2023    History of stroke 04/16/2023   Fall at home, initial encounter 04/16/2023   Atrial fib/flutter, transient (HCC) 04/16/2023   Paroxysmal atrial fibrillation (HCC) 04/15/2023   Elevated troponin 04/15/2023   Chest pain 04/13/2023   Stroke due to embolism (HCC) 03/20/2023   Chest pain, rule out acute myocardial infarction 02/17/2023   Chemotherapy-induced neuropathy (HCC) 11/28/2022   Pain due to onychomycosis of toenails of both feet 11/08/2022   Multiple myeloma (HCC) 07/24/2022   Chronic diastolic CHF (congestive heart failure) (HCC) 05/16/2022   Symptomatic anemia 05/16/2022   Hyponatremia 04/02/2022   Microcytic anemia 04/02/2022   S/P TAVR (transcatheter aortic valve replacement) 09/03/2018   Hypothyroidism    Hypertension    Hyperlipidemia    GERD (gastroesophageal reflux disease)    S/P right TKA 06/18/2017   Class 1 obesity 03/15/2016   S/P left TKA 03/13/2016   History of breast cancer, DCIS, lumpectomy March 2000 12/13/2011    PCP: Dr. Irena Reichmann  REFERRING PROVIDER: Dr. Jeanie Sewer, IV  REFERRING DIAG: Multiple Myeloma  THERAPY DIAG:  Multiple myeloma without remission (HCC)  Muscle weakness (generalized)  Difficulty walking  Balance problem  Neck pain  ONSET DATE: 07/2022  Rationale for Evaluation and Treatment: Rehabilitation                                                                                                                                                                                           SUBJECTIVE STATEMENT:   I tried the towel roll in my pillow and it really helped. My neck felt a lot better especially yesterday. I woke up at 5:00 this am and I couldn't get back to sleep.  ZO:XWRU 11/08/23 :pt was last seen in PT on 09/27/2023. Following that she was seen in the ED on 09/30/2022 for  Afib which returned to sinus rhythm with vagal maneuvers and later  Non ST elevated myocardial infarction. She has chronic diastolic CHF, and had a  TAVR on 09/03/2018. She has been given approval by cardiology to  return to PT with script in chart but will do re-eval with prior ordering physician.I still get dizzy especially in the am before the medications. There is some pain in the anterior thighs with movement and lying down. It seems to be continuous;it doesn't get better or worse. I can get up better from a solid chair than a soft chair. I still feel like my balance is off.   eval I need to get my balance better so I can move without fear of falling. Difficult getting up and down from chairs, Some fatigue with walking short distances;ie car into clinic. Reports some foot numbness if she is cold but feels good with socks on. Her grandson lives with her, but she has a home health aid in the day time and the night time for safety PERTINENT HISTORY:  Pt was diagnosed with  IgGMultiple Myeloma in 07/2022 after a bone Marrow biopsy showed 60% plasma cells and anemia. No lytic lesions. She was started on chemotherapy. She had surgery for T 8 compression fracture kyphoplasty on 09/07/2022. She was hospitalized 04/15/2023 for Atrial Fibrillation with cardioversion attempted on 04/18/2023. She had some PT at that time. She has a home health aide that drives her to appts and helps with keeping her house clean And someone at night to help her get to the bathroom  PMH significant for A-fib on Eliquis and amiodarone, hypothyroidism, HTN, breast cancer, multiple myeloma,aortic stenosis status post TAVR, hx of TIA, Macular degeneration, Head aches she thinks related to her eyes PAIN:  PAIN:  Are you having pain? Yes NPRS scale: 6-7/10 neck and back Pain location: left neck Pain orientation: Bilateral , left greater than right PAIN TYPE: sharp, constant Pain description: Aggravating factors: relieving factors: rest.   PRECAUTIONS: PMH significant of A-fib on Eliquis and amiodarone, hypothyroidism, HTN, Left breast cancer, multiple myeloma,aortic stenosis  status post TAVR, hx of TIA, T8 compression fx with kyphoplasty  RED FLAGS: Bowel or bladder incontinence: Yes:   and Compression fracture: Yes: had a kyphoplasty    WEIGHT BEARING RESTRICTIONS: No  FALLS:  Has patient fallen in last 6 months? Yes. Number of falls 1, fell in community garden: no injury , 09/14/2023 Larey Seat in home today  LIVING ENVIRONMENT: Lives with: Lucila Maine lives with her presently Lives in: House/apartment Stairs: No;  Has following equipment at home: Single point cane, Environmental consultant - 4 wheeled, and shower chair  OCCUPATION: Retired  LEISURE: read,visit with friends  PRIOR LEVEL OF FUNCTION: Independent with basic ADLs, Independent with household mobility with device, and Needs assistance with homemaking  PATIENT GOALS: Be able to walk more securely, not be dependent on a walker/rollator   OBJECTIVE:  COGNITION: Overall cognitive status: deficits  PALPATION:   OBSERVATIONS / OTHER ASSESSMENTS: Ambulates with rollator, flexed posture/increased kyphosis  SENSATION: Light touch: deficits   POSTURE: forward head, rounded shoulders, increased thoracic kyphosis  CERVICAL AROM: Flexion WNL, NP,right ROT Dec. 50% pain on left, Left Rot dec 60% pain on left,Right SB Dec 60% pain/tight, left SB dec 90% pain Tender B UT, posterior cervicals suboccipitals greatest on the left  LOWER EXTREMITY STRENGTH:   Right RotMMT Right eval RIGHT 09/14/2023  Hip flexion 4- 4+  Hip extension Able to bridge Can bridge  Hip abduction 4- 4+  Hip adduction 4 -seated 4+  Hip internal rotation 3- 3-  Hip external rotation 3+ 3  Knee flexion 4+ 5  Knee extension 4+ 4+ pain  Ankle dorsiflexion 5 5  Ankle plantarflexion  Ankle inversion    Ankle eversion    Great toe extension     (Blank rows = not tested)  MMT LEFT eval LEFT 09/14/2023  Hip flexion 4 4, knee pain  Hip extension Able to bridge Can bridge  Hip abduction 4- 4  Hip adduction 4- 4+  Hip internal rotation  3+ 4-  Hip external rotation 3 3-  Knee flexion 4+ 5  Knee extension 4 4+ pain  Ankle dorsiflexion 5 5  Ankle plantarflexion    Ankle inversion    Ankle eversion    Great toe extension      (Blank rows = not tested)  LYMPHEDEMA ASSESSMENTS:   SURGERY TYPE/DATE: Left lumpectomy 2000  NUMBER OF LYMPH NODES REMOVED: Thinks so, not sure how many  CHEMOTHERAPY: Yes for Multiple Myeloma, No for Breast Cancer  RADIATION:No for Breast Cancer  HORMONE TREATMENT: Yes for Breast Cancer  INFECTIONS: NO  FUNCTIONAL TESTS:  30 seconds sit to stand test: EVAL: 5 repetitions with use of Ue's 09/14/2023 ; 5 repetitions with minimal use of Ue's, but some knee pain after fall today 11/08/2023: 6 with light use of both hands 12/18/2023  5 repetitions with use of both hands  Timed up and go: 30.4 seconds with rollator and use of hands to rise from chair 09/14/2023  2 trials 23 seconds, 20 seconds 11/08/2023:    23.01 22.27 seconds with rollator and using hands to rise 12/18/2023 Avg of 2 trials 24.2 seconds  Four position balance test:  able to maintain DLS feet together x 10 sec, and half tandem with left foot forward x 10 sec. , unable to hold half tandem with right foot forward for greater than 3 seconds, unable to maintain tandem stance bilaterally for more than a few seconds. Did not test SLS 11/07/2023 Half tandem right forward 23 sec, left forward 22 GAIT: Distance walked: Nustep to ramp in front of building Assistive device utilized: Rollator Level of assistance: Modified independence Comments:flexed posture, increased kyphosis    Outcome measure:  TODAY'S TREATMENT:                                                                                                                                          DATE:    12/20/2023 O2 97%, HR 61 Nu Step seat 8, UE 9, Lev 4, 348 steps Heel raises x 10 with HH, x 10 no HH Marching x 10 with HH, x 10 no HH Sidestepping 4 lengths in bar  emphasis on erect posture Semi tandem stance B with intermittent hand hold, tandem stance with intermittent hand hold Ambulated to ortho gym with cane in right UE and stopped to rest in chair. Good balance, erect posture. Emphasis on cane placement Ambulated back ot cancer gym; tried cane in left hand but returned back to right hand half way down as pt was losing her balance to the  left. Seated brief rest Sit to stand from chair with ax 2 x 5 no hands Step ups on ax x 10 ea side with light hand touch Standing on foam ax; feet apart, feet together eyes open; notable sway but worse with feet together x 30 sec each 12/18/2023 O2 98%, HR 66 30 sec sit to stand: 5 reps TUG; 2 trial avg 24.2 sec Assessed cervical ROM for recert Messaged MD to add cervical referral Nu step x 4 min seat 8, UE 9 Lev 4 x 4  min stopping due to pt fatigue Educated in importance of erect postrue for decreasing neck pain. Educated in towel roll for pillow, and lumbar roll for sitting to improve posture. VC to maintain good posture with head and neck throughout treatment STM to bilateral cervical region with cocoa butter with pt sitting in chair. Felt good but no overall improvement.   11/20/2023 02 99%,  HR 55 Pt arrived with her SPC and no walker today. Caregiver walked in with her. Nu Step seat 8, UE 9, L5 x 6 min, 361 steps Heel raises x 10 with HH, x 10 no HH Marching x 10 with HH, x 10 no HH Sidestepping 4 lengths in bar Incline stretch x 3 Half tandem x 1 B, tandem stance x 1 B, to failure Ambulated with SPC 116 ft seated rest, 112 ft seated rest with emphasis on cane placement  and step length , CGA of PT Sit to stand with purple cushion x 10 with CGA Ambulated 1 lap around cancer gym with SPC, CGA  11/08/2023 O2 99,  HR 57,  BP 135/59 Performed 30 sec sit to stand, TUG and half tandem stance for recert Practiced half tandem stance;pt able to hold bilateral feet forward in half tandem for greater than 20  sec. On first attempt Ambulated 52 ft, rest,and then 90 feet, then seated rest, then 97 feet with SPC in right UE and CGA emphasis on coordinating gait and cane and looking up.seated rest.   09/27/2023  Nu Step seat 8, UE 9 Lev 3 x 8 min to warm up and loosen knees and muscles In parallel bars using left hand only practice ambulating 6 lengths with single arm support Instructed in proper cane placement and gait pattern with step through gait using SPC;pt wearing gait belt and with CGA of PT; Practiced in parallel bars x 8 lengths and then as pt improved went to gym floor.  Seated rest Practiced on straight aways x 3 and then walked half way to ortho gym with CGA of PT and emphasis on erect posture and proper step through. Seated rest Repeated walking around gym and straight aways. Pt noted to limp slightly on left LE as she increased her distance. She was not aware of this. Also appeared to be hip abd weakness. Fully explained to pt my concerns about her walking with a cane;intermittent dizziness and fall risk. Advised her to contact pharmacist to see if any of her meds could be causing dizziness. Ishe tells me she has used the rollator atleast a year.  09/14/2023 02 99, HR 68 BP 129/68 Sit to stand :30 seconds : 5 reps with some knee pain TUG: 2 trials  23 sec, 20 sec Half tandem stance able to maintain Bilaterally x 15 sec Full tandem done bilaterally; limited to no greater than 3 sec for 2-3 trials each Tested lower extremity strength bilaterally and reviewed goals with pt.  09/11/23: Therapeutic Exercises Nu Step seat 8, UE  9 Lev 3 x 8 min, 588 steps Vital signs taken after NuStep at pts request, see Vital Signs In // bars with 1# added to each ankle for following: Heel raises x 10 with HH, x 10 without HH High march x 10 B with fingertip assist Low march x 10 with fingertips, SBA SLS on blue oval for bil hip flex and abd x 10 each (still with 1# on ankles) Seated rest after //  bars and removed ankle weights Sit to stand with purple cushion in chair 2 x 5, SBA to CGA (1 posterior LOB to chair) In // bars for multiple trials of modified tandem stance bilaterally in varying positions for 10 sec at a time with fingertip support throughout, was able to get to practicing full tandem today which pt has not felt confident enough to try before. Pt ambulated from cancer gym to ortho gym and returned without rest and with rollator; SBA   09/04/23: Therapeutic Exercises Nu Step seat 8, UE 9 Lev 3 x 8 min, 605 steps In // bars: Heel raises x 10 with HH, x 10 without HH High march x 10 B with fingertip assist Low march x 10 no HH, SBA Vector reaches with handhold x 5 each returning therapist demo Sit to stand with purple cushion in chair 2 x 5, CGA In // bars for multiple trials of modified tandem stance bilaterally in varying positions for 10 sec at a time working towards no HH On mini tramp for following: High march 2 x 5 each with +2 HH, 3 way weight shifting x 30 sec each +2 HH, then 1 trial partial tandem stance each x 30 sec with fingertips Pt ambulated from cancer gym to ortho gym and returned without rest and with rollator; SBA   08/31/23:  SpO2 98%, HR 61 bpm Therapeutic Exercises Nu Step seat 8, UE 9 Lev 3 x 8 min, 546 steps In // bars:Heel raises x 10 with HH, x 10 without HH High march x 10 B with HH Low march x 10 no HH, CGA UE reaches with SBA-CGA x 5 each side encouraging reaching until it becomes challenging Sit to stand with purple cushion in chair 2 x 5, CGA 6 in step ups 2 x 5 bilaterally with HH Foot placement on 6 in step forward 2 x 5 ea with second set no HH and sideways bilaterally x 5 ea, no HH; min VC's for erect posture throughout Pt ambulated from cancer gym to ortho gym and returned without rest and with rollator; SBA       PATIENT EDUCATION:  Access Code: [email or web address] URL: [email or web address] Date: 07/12/2023 Prepared by:  Alvira Monday  Exercises - Supine Bridge  - 1 x daily - 7 x weekly - 1 sets - 10 reps - Seated Long Arc Quad  - 1 x daily - 7 x weekly - 1 sets - 10 reps - Standing Hip Abduction with Counter Support  - 1 x daily - 7 x weekly - 1 sets - 10 reps - Seated March  - 1 x daily - 7 x weekly - 1 sets - 10 reps - Standing March with Counter Support  - 1 x daily - 7 x weekly - 1 sets - 10 reps - Standing Heel Raise  - 1 x daily - 7 x weekly - 1 sets - 10 reps - Seated Hip Adduction Isometrics with Ball  - 1 x daily - 7 x weekly - 1 sets - 10 reps -  5 hold Education details: Discussed POC and treatment interventions including, strength, balance and function Person educated: Patient Education method: Explanation Education comprehension: verbalized understanding  HOME EXERCISE PROGRAM:  Supine Bridge  - 1 x daily - 7 x weekly - 1 sets - 10 reps - Seated Long Arc Quad  - 1 x daily - 7 x weekly - 1 sets - 10 reps - Standing Hip Abduction with Counter Support  - 1 x daily - 7 x weekly - 1 sets - 10 reps - Seated March  - 1 x daily - 7 x weekly - 1 sets - 10 reps - Standing March with Counter Support  - 1 x daily - 7 x weekly - 1 sets - 10 reps - Standing Heel Raise  - 1 x daily - 7 x weekly - 1 sets - 10 reps - Seated Hip Adduction Isometrics with Ball  - 1 x daily - 7 x weekly - 1 sets - 10 reps - 5 hold  ASSESSMENT:  CLINICAL IMPRESSION: Pt more easily fatigued today secondary to just coming off 3 weeks of illness. Did well ambulating with SPC in right UE, but off balance when using cane in left. Emphasized erect posture with exercises. Neck pain no worse after treatment today.   OBJECTIVE IMPAIRMENTS: decreased activity tolerance, decreased balance, decreased cognition, decreased endurance, decreased knowledge of condition, difficulty walking, decreased strength, impaired sensation, impaired vision/preception, postural dysfunction, and pain.   ACTIVITY LIMITATIONS: carrying, lifting, standing,  squatting, bed mobility, dressing, and locomotion level  PARTICIPATION LIMITATIONS: meal prep, cleaning, laundry, driving, shopping, and community activity  PERSONAL FACTORS: Age and 3+ comorbidities: multiple myeloma,Macular Degeneration , Afib are also affecting patient's functional outcome.   REHAB POTENTIAL: Good  CLINICAL DECISION MAKING: Evolving/moderate complexity  EVALUATION COMPLEXITY: Low   GOALS: Goals reviewed with patient? Yes  SHORT TERM GOALS: Target date: 07/30/2023  Pt will be independent in a HEP for LE/core strength Baseline: Goal status: MET 08/14/2023 2.  Pt will be able to maintain half tandem stance bilaterally x 10 sec Baseline:  Goal status:MET 08/14/2023 3.  Pt will perform 6-7 sit to stands in 30 seconds to decrease fall risk Baseline:  Goal status:MET 10/24, 6 reps with hands 4.  Pt will perform TUG in 26 seconds to decrease fall risk Baseline:   Goal status: MET 10/24   LONG TERM GOALS: Target date: 10/25/2022 Pt will be able to maintain tandem stance for 10 seconds B to demonstrate improved balance Baseline:  Goal status:  In progress  2.  Pt will be able to perform 9 sit to stands in 30 seconds for average score to decrease fall risk Baseline:  Goal status: In Progress, 6 today with use of hands 3.  Pt will be able to perform TUG with rollator in 24 seconds or less for improved gait speed and decreased fall risk Baseline:  Goal status:MET  09/14/2023  4.  Pt will be independent in more advanced HEP Baseline:  Goal status: In Progress  5. Pt will be able to safely ambulate household distances with quad cane  GOAL STATUS; IN PROGRESS  PLAN:  PT FREQUENCY: 2x/week  PT DURATION: 4 weeks  PLANNED INTERVENTIONS: Therapeutic exercises, Therapeutic activity, Neuromuscular re-education, Balance training, Gait training, Patient/Family education, Self Care, and Manual therapy  PLAN FOR NEXT SESSION: Postural education,Gait, progress  strength, balance, function, gait with quad cane at pt request.Cervical treatment as required and approved   Waynette Buttery, PT 12/20/2023, 11:00 AM

## 2023-12-25 ENCOUNTER — Ambulatory Visit: Payer: Medicare Other | Attending: Hematology and Oncology

## 2023-12-25 DIAGNOSIS — R262 Difficulty in walking, not elsewhere classified: Secondary | ICD-10-CM | POA: Diagnosis not present

## 2023-12-25 DIAGNOSIS — M6281 Muscle weakness (generalized): Secondary | ICD-10-CM | POA: Insufficient documentation

## 2023-12-25 DIAGNOSIS — M542 Cervicalgia: Secondary | ICD-10-CM | POA: Insufficient documentation

## 2023-12-25 DIAGNOSIS — C9000 Multiple myeloma not having achieved remission: Secondary | ICD-10-CM | POA: Diagnosis not present

## 2023-12-25 DIAGNOSIS — R2689 Other abnormalities of gait and mobility: Secondary | ICD-10-CM | POA: Insufficient documentation

## 2023-12-25 NOTE — Therapy (Signed)
 OUTPATIENT PHYSICAL THERAPY  LOWER EXTREMITY ONCOLOGY TREATMENT  Patient Name: Zoe [name] MRN: [id number] DOB:[birth year], 89 y.o., female Today's Date: 12/25/2023  END OF SESSION:  PT End of Session - 12/25/23 1100     Visit Number 19    Number of Visits 27    Date for PT Re-Evaluation 01/15/24    Authorization Type [employer]    Authorization Time Period 12/21/2023-01/18/2024    Authorization - Visit Number 1    Authorization - Number of Visits 8    Progress Note Due on Visit 0    PT Start Time 1101    PT Stop Time 1153    PT Time Calculation (min) 52 min    Equipment Utilized During Treatment Gait belt    Activity Tolerance Patient tolerated treatment well;Patient limited by fatigue    Behavior During Therapy WFL for tasks assessed/performed                Past Medical History:  Diagnosis Date   Arthritis    some - per patient   Breast cancer (HCC)    breast cancer / left    Cataract    bilat    GERD (gastroesophageal reflux disease)    History of kidney stones    Hyperlipidemia    Hypertension    Hypothyroidism    Macular degeneration    Left   S/P TAVR (transcatheter aortic valve replacement) 09/03/2018   23 mm Edwards Sapien 3 transcatheter heart valve placed via percutaneous right transfemoral approach    Severe aortic stenosis    Stress incontinence    Thyroid disease    Tinnitus    Past Surgical History:  Procedure Laterality Date   ABDOMINAL HYSTERECTOMY  1970's   BACK SURGERY     BREAST LUMPECTOMY  12/1998   lumpectomy   CARDIAC CATHETERIZATION     CARDIOVERSION N/A 04/18/2023   Procedure: CARDIOVERSION;  Surgeon: Tessa Lerner, DO;  Location: [facility] INVASIVE CV LAB;  Service: Cardiovascular;  Laterality: N/A;   CARDIOVERSION N/A 04/20/2023   Procedure: CARDIOVERSION;  Surgeon: Yates Decamp, MD;  Location: [facility] INVASIVE CV LAB;  Service: Cardiovascular;  Laterality: N/A;   EYE SURGERY     cataract surgery bilat    INTRAOPERATIVE TRANSTHORACIC  ECHOCARDIOGRAM N/A 09/03/2018   Procedure: INTRAOPERATIVE TRANSTHORACIC ECHOCARDIOGRAM;  Surgeon: Kathleene Hazel, MD;  Location: [facility] OR;  Service: Open Heart Surgery;  Laterality: N/A;   KYPHOPLASTY N/A 09/07/2022   Procedure: THORACIC EIGHT KYPHOPLASTY;  Surgeon: Venita Lick, MD;  Location: [facility] OR;  Service: Orthopedics;  Laterality: N/A;  1 hr Local with [facility] 3 C-Bed   LITHOTRIPSY     Right total knee     2018 Dr. Charlann Boxer   RIGHT/LEFT HEART CATH AND CORONARY ANGIOGRAPHY N/A 08/06/2018   Procedure: RIGHT/LEFT HEART CATH AND CORONARY ANGIOGRAPHY;  Surgeon: Yates Decamp, MD;  Location: [facility] INVASIVE CV LAB;  Service: Cardiovascular;  Laterality: N/A;   THYROIDECTOMY, PARTIAL  1975   TONSILLECTOMY     as a child - patient not sure of exact date   TOTAL KNEE ARTHROPLASTY Left 03/13/2016   Procedure: TOTAL KNEE ARTHROPLASTY;  Surgeon: Durene Romans, MD;  Location: [facility] ORS;  Service: Orthopedics;  Laterality: Left;   TOTAL KNEE ARTHROPLASTY Right 06/18/2017   Procedure: RIGHT TOTAL KNEE ARTHROPLASTY;  Surgeon: Durene Romans, MD;  Location: [facility] ORS;  Service: Orthopedics;  Laterality: Right;   TRANSCATHETER AORTIC VALVE REPLACEMENT, TRANSFEMORAL N/A 09/03/2018   Procedure: TRANSCATHETER AORTIC VALVE REPLACEMENT, TRANSFEMORAL;  Surgeon: Kathleene Hazel, MD;  Location: [facility] OR;  Service: Open Heart Surgery;  Laterality: N/A;   Patient Active Problem List   Diagnosis Date Noted   Chronic anticoagulation 10/02/2023   Vasospastic angina (HCC) 10/01/2023   History of coronary vasospasm 10/01/2023   NSTEMI (non-ST elevated myocardial infarction) (HCC) 10/01/2023   Hypertensive urgency 07/16/2023   Urinary tract infection due to Klebsiella species 07/16/2023   Chronic heart failure with preserved ejection fraction (HFpEF) (HCC) 07/16/2023   Hypokalemia 07/16/2023   Hypocalcemia 07/16/2023   Atypical atrial flutter (HCC) 04/19/2023   Acute on chronic diastolic heart failure (HCC)  40/98/1191   Paroxysmal atrial fibrillation with rapid ventricular response (HCC) 04/18/2023   History of stroke 04/16/2023   Fall at home, initial encounter 04/16/2023   Atrial fib/flutter, transient (HCC) 04/16/2023   Paroxysmal atrial fibrillation (HCC) 04/15/2023   Elevated troponin 04/15/2023   Chest pain 04/13/2023   Stroke due to embolism (HCC) 03/20/2023   Chest pain, rule out acute myocardial infarction 02/17/2023   Chemotherapy-induced neuropathy (HCC) 11/28/2022   Pain due to onychomycosis of toenails of both feet 11/08/2022   Multiple myeloma (HCC) 07/24/2022   Chronic diastolic CHF (congestive heart failure) (HCC) 05/16/2022   Symptomatic anemia 05/16/2022   Hyponatremia 04/02/2022   Microcytic anemia 04/02/2022   S/P TAVR (transcatheter aortic valve replacement) 09/03/2018   Hypothyroidism    Hypertension    Hyperlipidemia    GERD (gastroesophageal reflux disease)    S/P right TKA 06/18/2017   Class 1 obesity 03/15/2016   S/P left TKA 03/13/2016   History of breast cancer, DCIS, lumpectomy March 2000 12/13/2011    PCP: Dr. Irena Reichmann  REFERRING PROVIDER: Dr. Jeanie Sewer, IV  REFERRING DIAG: Multiple Myeloma  THERAPY DIAG:  Multiple myeloma without remission (HCC)  Muscle weakness (generalized)  Difficulty walking  Balance problem  Neck pain  ONSET DATE: 07/2022  Rationale for Evaluation and Treatment: Rehabilitation                                                                                                                                                                                           SUBJECTIVE STATEMENT:   My neck pain comes and goes, minor today. My calves are hurting today, left greater than right  YN:WGNF 11/08/23 :pt was last seen in PT on 09/27/2023. Following that she was seen in the ED on 09/30/2022 for  Afib which returned to sinus rhythm with vagal maneuvers and later  Non ST elevated myocardial infarction. She has chronic  diastolic CHF, and had a TAVR on 09/03/2018. She has been given approval by cardiology to return  to PT with script in chart but will do re-eval with prior ordering physician.I still get dizzy especially in the am before the medications. There is some pain in the anterior thighs with movement and lying down. It seems to be continuous;it doesn't get better or worse. I can get up better from a solid chair than a soft chair. I still feel like my balance is off.   eval I need to get my balance better so I can move without fear of falling. Difficult getting up and down from chairs, Some fatigue with walking short distances;ie car into clinic. Reports some foot numbness if she is cold but feels good with socks on. Her grandson lives with her, but she has a home health aid in the day time and the night time for safety PERTINENT HISTORY:  Pt was diagnosed with  IgGMultiple Myeloma in 07/2022 after a bone Marrow biopsy showed 60% plasma cells and anemia. No lytic lesions. She was started on chemotherapy. She had surgery for T 8 compression fracture kyphoplasty on 09/07/2022. She was hospitalized 04/15/2023 for Atrial Fibrillation with cardioversion attempted on 04/18/2023. She had some PT at that time. She has a home health aide that drives her to appts and helps with keeping her house clean And someone at night to help her get to the bathroom  PMH significant for A-fib on Eliquis and amiodarone, hypothyroidism, HTN, breast cancer, multiple myeloma,aortic stenosis status post TAVR, hx of TIA, Macular degeneration, Head aches she thinks related to her eyes PAIN:  PAIN:  Are you having pain? Yes NPRS scale: 2/10 neck, 6/10 left greater than right gastrocPain location: left neck Pain orientation: Bilateral , left greater than right PAIN TYPE: calf;tight Pain description: Aggravating factors: relieving factors: rest.   PRECAUTIONS: PMH significant of A-fib on Eliquis and amiodarone, hypothyroidism, HTN, Left  breast cancer, multiple myeloma,aortic stenosis status post TAVR, hx of TIA, T8 compression fx with kyphoplasty  RED FLAGS: Bowel or bladder incontinence: Yes:   and Compression fracture: Yes: had a kyphoplasty    WEIGHT BEARING RESTRICTIONS: No  FALLS:  Has patient fallen in last 6 months? Yes. Number of falls 1, fell in community garden: no injury , 09/14/2023 Larey Seat in home today  LIVING ENVIRONMENT: Lives with: Lucila Maine lives with her presently Lives in: House/apartment Stairs: No;  Has following equipment at home: Single point cane, Environmental consultant - 4 wheeled, and shower chair  OCCUPATION: Retired  LEISURE: read,visit with friends  PRIOR LEVEL OF FUNCTION: Independent with basic ADLs, Independent with household mobility with device, and Needs assistance with homemaking  PATIENT GOALS: Be able to walk more securely, not be dependent on a walker/rollator   OBJECTIVE:  COGNITION: Overall cognitive status: deficits  PALPATION:   OBSERVATIONS / OTHER ASSESSMENTS: Ambulates with rollator, flexed posture/increased kyphosis  SENSATION: Light touch: deficits   POSTURE: forward head, rounded shoulders, increased thoracic kyphosis  CERVICAL AROM: Flexion WNL, NP,right ROT Dec. 50% pain on left, Left Rot dec 60% pain on left,Right SB Dec 60% pain/tight, left SB dec 90% pain Tender B UT, posterior cervicals suboccipitals greatest on the left  LOWER EXTREMITY STRENGTH:   Right RotMMT Right eval RIGHT 09/14/2023  Hip flexion 4- 4+  Hip extension Able to bridge Can bridge  Hip abduction 4- 4+  Hip adduction 4 -seated 4+  Hip internal rotation 3- 3-  Hip external rotation 3+ 3  Knee flexion 4+ 5  Knee extension 4+ 4+ pain  Ankle dorsiflexion 5 5  Ankle plantarflexion  Ankle inversion    Ankle eversion    Great toe extension     (Blank rows = not tested)  MMT LEFT eval LEFT 09/14/2023  Hip flexion 4 4, knee pain  Hip extension Able to bridge Can bridge  Hip abduction 4-  4  Hip adduction 4- 4+  Hip internal rotation 3+ 4-  Hip external rotation 3 3-  Knee flexion 4+ 5  Knee extension 4 4+ pain  Ankle dorsiflexion 5 5  Ankle plantarflexion    Ankle inversion    Ankle eversion    Great toe extension      (Blank rows = not tested)  LYMPHEDEMA ASSESSMENTS:   SURGERY TYPE/DATE: Left lumpectomy 2000  NUMBER OF LYMPH NODES REMOVED: Thinks so, not sure how many  CHEMOTHERAPY: Yes for Multiple Myeloma, No for Breast Cancer  RADIATION:No for Breast Cancer  HORMONE TREATMENT: Yes for Breast Cancer  INFECTIONS: NO  FUNCTIONAL TESTS:  30 seconds sit to stand test: EVAL: 5 repetitions with use of Ue's 09/14/2023 ; 5 repetitions with minimal use of Ue's, but some knee pain after fall today 11/08/2023: 6 with light use of both hands 12/18/2023  5 repetitions with use of both hands  Timed up and go: 30.4 seconds with rollator and use of hands to rise from chair 09/14/2023  2 trials 23 seconds, 20 seconds 11/08/2023:    23.01 22.27 seconds with rollator and using hands to rise 12/18/2023 Avg of 2 trials 24.2 seconds  Four position balance test:  able to maintain DLS feet together x 10 sec, and half tandem with left foot forward x 10 sec. , unable to hold half tandem with right foot forward for greater than 3 seconds, unable to maintain tandem stance bilaterally for more than a few seconds. Did not test SLS 11/07/2023 Half tandem right forward 23 sec, left forward 22 GAIT: Distance walked: Nustep to ramp in front of building Assistive device utilized: Rollator Level of assistance: Modified independence Comments:flexed posture, increased kyphosis    Outcome measure:  TODAY'S TREATMENT:                                                                                                                                          DATE:  12/25/2023 Nu Step seat 8, UE 9, Lev 4, 367 steps Heel raises x 10 with HH, x 10 no HH Marching x 10 with HH, x 10 no  HH Sidestepping 4 lengths in bar emphasis on erect posture Semi tandem stance B with intermittent hand hold, tandem stance with intermittent hand hold to failure Calf stretch with strap seated x 2 ea, standing calf stretch in bars with lunge x 2 ea Ambulated 2 x 150 ft with rollator seated rest between  Ambulated around cancer gym x 1 with SPC; pt felt slightly wobbly so pt rested before leaving  12/20/2023 O2 97%, HR 61 Nu Step  seat 8, UE 9, Lev 4, 348 steps Heel raises x 10 with HH, x 10 no HH Marching x 10 with HH, x 10 no HH Sidestepping 4 lengths in bar emphasis on erect posture Semi tandem stance B with intermittent hand hold, tandem stance with intermittent hand hold Ambulated to ortho gym with cane in right UE and stopped to rest in chair. Good balance, erect posture. Emphasis on cane placement Ambulated back ot cancer gym; tried cane in left hand but returned back to right hand half way down as pt was losing her balance to the left. Seated brief rest Sit to stand from chair with ax 2 x 5 no hands Step ups on ax x 10 ea side with light hand touch Standing on foam ax; feet apart, feet together eyes open; notable sway but worse with feet together x 30 sec each 12/18/2023 O2 98%, HR 66 30 sec sit to stand: 5 reps TUG; 2 trial avg 24.2 sec Assessed cervical ROM for recert Messaged MD to add cervical referral Nu step x 4 min seat 8, UE 9 Lev 4 x 4  min stopping due to pt fatigue Educated in importance of erect postrue for decreasing neck pain. Educated in towel roll for pillow, and lumbar roll for sitting to improve posture. VC to maintain good posture with head and neck throughout treatment STM to bilateral cervical region with cocoa butter with pt sitting in chair. Felt good but no overall improvement.   11/20/2023 02 99%,  HR 55 Pt arrived with her SPC and no walker today. Caregiver walked in with her. Nu Step seat 8, UE 9, L5 x 6 min, 361 steps Heel raises x 10 with HH, x 10 no  HH Marching x 10 with HH, x 10 no HH Sidestepping 4 lengths in bar Incline stretch x 3 Half tandem x 1 B, tandem stance x 1 B, to failure Ambulated with SPC 116 ft seated rest, 112 ft seated rest with emphasis on cane placement  and step length , CGA of PT Sit to stand with purple cushion x 10 with CGA Ambulated 1 lap around cancer gym with SPC, CGA  11/08/2023 O2 99,  HR 57,  BP 135/59 Performed 30 sec sit to stand, TUG and half tandem stance for recert Practiced half tandem stance;pt able to hold bilateral feet forward in half tandem for greater than 20 sec. On first attempt Ambulated 52 ft, rest,and then 90 feet, then seated rest, then 97 feet with SPC in right UE and CGA emphasis on coordinating gait and cane and looking up.seated rest.   09/27/2023  Nu Step seat 8, UE 9 Lev 3 x 8 min to warm up and loosen knees and muscles In parallel bars using left hand only practice ambulating 6 lengths with single arm support Instructed in proper cane placement and gait pattern with step through gait using SPC;pt wearing gait belt and with CGA of PT; Practiced in parallel bars x 8 lengths and then as pt improved went to gym floor.  Seated rest Practiced on straight aways x 3 and then walked half way to ortho gym with CGA of PT and emphasis on erect posture and proper step through. Seated rest Repeated walking around gym and straight aways. Pt noted to limp slightly on left LE as she increased her distance. She was not aware of this. Also appeared to be hip abd weakness. Fully explained to pt my concerns about her walking with a cane;intermittent dizziness and fall  risk. Advised her to contact pharmacist to see if any of her meds could be causing dizziness. Ishe tells me she has used the rollator atleast a year.  09/14/2023 02 99, HR 68 BP 129/68 Sit to stand :30 seconds : 5 reps with some knee pain TUG: 2 trials  23 sec, 20 sec Half tandem stance able to maintain Bilaterally x 15 sec Full  tandem done bilaterally; limited to no greater than 3 sec for 2-3 trials each Tested lower extremity strength bilaterally and reviewed goals with pt.  09/11/23: Therapeutic Exercises Nu Step seat 8, UE 9 Lev 3 x 8 min, 588 steps Vital signs taken after NuStep at pts request, see Vital Signs In // bars with 1# added to each ankle for following: Heel raises x 10 with HH, x 10 without HH High march x 10 B with fingertip assist Low march x 10 with fingertips, SBA SLS on blue oval for bil hip flex and abd x 10 each (still with 1# on ankles) Seated rest after // bars and removed ankle weights Sit to stand with purple cushion in chair 2 x 5, SBA to CGA (1 posterior LOB to chair) In // bars for multiple trials of modified tandem stance bilaterally in varying positions for 10 sec at a time with fingertip support throughout, was able to get to practicing full tandem today which pt has not felt confident enough to try before. Pt ambulated from cancer gym to ortho gym and returned without rest and with rollator; SBA   09/04/23: Therapeutic Exercises Nu Step seat 8, UE 9 Lev 3 x 8 min, 605 steps In // bars: Heel raises x 10 with HH, x 10 without HH High march x 10 B with fingertip assist Low march x 10 no HH, SBA Vector reaches with handhold x 5 each returning therapist demo Sit to stand with purple cushion in chair 2 x 5, CGA In // bars for multiple trials of modified tandem stance bilaterally in varying positions for 10 sec at a time working towards no HH On mini tramp for following: High march 2 x 5 each with +2 HH, 3 way weight shifting x 30 sec each +2 HH, then 1 trial partial tandem stance each x 30 sec with fingertips Pt ambulated from cancer gym to ortho gym and returned without rest and with rollator; SBA   08/31/23:  SpO2 98%, HR 61 bpm Therapeutic Exercises Nu Step seat 8, UE 9 Lev 3 x 8 min, 546 steps In // bars:Heel raises x 10 with HH, x 10 without HH High march x 10 B with HH Low  march x 10 no HH, CGA UE reaches with SBA-CGA x 5 each side encouraging reaching until it becomes challenging Sit to stand with purple cushion in chair 2 x 5, CGA 6 in step ups 2 x 5 bilaterally with HH Foot placement on 6 in step forward 2 x 5 ea with second set no HH and sideways bilaterally x 5 ea, no HH; min VC's for erect posture throughout Pt ambulated from cancer gym to ortho gym and returned without rest and with rollator; SBA       PATIENT EDUCATION:    Gastroc stretch with strap or lunge to counter Access Code: [email or web address] URL: [email or web address] Date: 07/12/2023 Prepared by: Alvira Monday  Exercises Gastroc stretches - Supine Bridge  - 1 x daily - 7 x weekly - 1 sets - 10 reps - Seated Long Arc Quad  - 1 x daily -  7 x weekly - 1 sets - 10 reps - Standing Hip Abduction with Counter Support  - 1 x daily - 7 x weekly - 1 sets - 10 reps - Seated March  - 1 x daily - 7 x weekly - 1 sets - 10 reps - Standing March with Counter Support  - 1 x daily - 7 x weekly - 1 sets - 10 reps - Standing Heel Raise  - 1 x daily - 7 x weekly - 1 sets - 10 reps - Seated Hip Adduction Isometrics with Ball  - 1 x daily - 7 x weekly - 1 sets - 10 reps - 5 hold Education details: Discussed POC and treatment interventions including, strength, balance and function Person educated: Patient Education method: Explanation Education comprehension: verbalized understanding  HOME EXERCISE PROGRAM:  Supine Bridge  - 1 x daily - 7 x weekly - 1 sets - 10 reps - Seated Long Arc Quad  - 1 x daily - 7 x weekly - 1 sets - 10 reps - Standing Hip Abduction with Counter Support  - 1 x daily - 7 x weekly - 1 sets - 10 reps - Seated March  - 1 x daily - 7 x weekly - 1 sets - 10 reps - Standing March with Counter Support  - 1 x daily - 7 x weekly - 1 sets - 10 reps - Standing Heel Raise  - 1 x daily - 7 x weekly - 1 sets - 10 reps - Seated Hip Adduction Isometrics with Ball  - 1 x daily - 7 x weekly  - 1 sets - 10 reps - 5 hold  ASSESSMENT:  CLINICAL IMPRESSION: Pt fatigued easily today and opted to walk first with her rollator and just a short period with SPC due to feeling slightly off balance. Bilateral gastrocs were tight and her HEP was updated with 2 different stretches for her to try.  OBJECTIVE IMPAIRMENTS: decreased activity tolerance, decreased balance, decreased cognition, decreased endurance, decreased knowledge of condition, difficulty walking, decreased strength, impaired sensation, impaired vision/preception, postural dysfunction, and pain.   ACTIVITY LIMITATIONS: carrying, lifting, standing, squatting, bed mobility, dressing, and locomotion level  PARTICIPATION LIMITATIONS: meal prep, cleaning, laundry, driving, shopping, and community activity  PERSONAL FACTORS: Age and 3+ comorbidities: multiple myeloma,Macular Degeneration , Afib are also affecting patient's functional outcome.   REHAB POTENTIAL: Good  CLINICAL DECISION MAKING: Evolving/moderate complexity  EVALUATION COMPLEXITY: Low   GOALS: Goals reviewed with patient? Yes  SHORT TERM GOALS: Target date: 07/30/2023  Pt will be independent in a HEP for LE/core strength Baseline: Goal status: MET 08/14/2023 2.  Pt will be able to maintain half tandem stance bilaterally x 10 sec Baseline:  Goal status:MET 08/14/2023 3.  Pt will perform 6-7 sit to stands in 30 seconds to decrease fall risk Baseline:  Goal status:MET 10/24, 6 reps with hands 4.  Pt will perform TUG in 26 seconds to decrease fall risk Baseline:   Goal status: MET 10/24   LONG TERM GOALS: Target date: 10/25/2022 Pt will be able to maintain tandem stance for 10 seconds B to demonstrate improved balance Baseline:  Goal status:  In progress  2.  Pt will be able to perform 9 sit to stands in 30 seconds for average score to decrease fall risk Baseline:  Goal status: In Progress, 6 today with use of hands 3.  Pt will be able to perform TUG  with rollator in 24 seconds or less for improved gait  speed and decreased fall risk Baseline:  Goal status:MET  09/14/2023  4.  Pt will be independent in more advanced HEP Baseline:  Goal status: In Progress  5. Pt will be able to safely ambulate household distances with quad cane  GOAL STATUS; IN PROGRESS  PLAN:  PT FREQUENCY: 2x/week  PT DURATION: 4 weeks  PLANNED INTERVENTIONS: Therapeutic exercises, Therapeutic activity, Neuromuscular re-education, Balance training, Gait training, Patient/Family education, Self Care, and Manual therapy  PLAN FOR NEXT SESSION: Postural education,Gait, progress strength, balance, function, gait with quad cane at pt request.Cervical treatment as required and approved   Waynette Buttery, PT 12/25/2023, 1:16 PM

## 2023-12-26 ENCOUNTER — Ambulatory Visit: Payer: Medicare Other | Attending: Hematology and Oncology

## 2023-12-27 ENCOUNTER — Ambulatory Visit: Payer: Medicare Other

## 2023-12-27 DIAGNOSIS — M542 Cervicalgia: Secondary | ICD-10-CM

## 2023-12-27 DIAGNOSIS — R262 Difficulty in walking, not elsewhere classified: Secondary | ICD-10-CM | POA: Diagnosis not present

## 2023-12-27 DIAGNOSIS — C9000 Multiple myeloma not having achieved remission: Secondary | ICD-10-CM | POA: Diagnosis not present

## 2023-12-27 DIAGNOSIS — M6281 Muscle weakness (generalized): Secondary | ICD-10-CM

## 2023-12-27 DIAGNOSIS — R2689 Other abnormalities of gait and mobility: Secondary | ICD-10-CM

## 2023-12-27 NOTE — Therapy (Signed)
 OUTPATIENT PHYSICAL THERAPY  LOWER EXTREMITY ONCOLOGY TREATMENT  Patient Name: Zoe [name] MRN: [id number] DOB:10/22/1934, 89 y.o., female Today's Date: 12/27/2023  END OF SESSION:  PT End of Session - 12/27/23 1103     Visit Number 20    Number of Visits 27    Date for PT Re-Evaluation 01/15/24    Authorization Type [employer]    Authorization Time Period 12/21/2023-01/18/2024    Authorization - Visit Number 2    Authorization - Number of Visits 8    PT Start Time 1103    PT Stop Time 1155    PT Time Calculation (min) 52 min    Equipment Utilized During Treatment Gait belt    Activity Tolerance Patient tolerated treatment well    Behavior During Therapy WFL for tasks assessed/performed                Past Medical History:  Diagnosis Date   Arthritis    some - per patient   Breast cancer (HCC)    breast cancer / left    Cataract    bilat    GERD (gastroesophageal reflux disease)    History of kidney stones    Hyperlipidemia    Hypertension    Hypothyroidism    Macular degeneration    Left   S/P TAVR (transcatheter aortic valve replacement) 09/03/2018   23 mm Edwards Sapien 3 transcatheter heart valve placed via percutaneous right transfemoral approach    Severe aortic stenosis    Stress incontinence    Thyroid disease    Tinnitus    Past Surgical History:  Procedure Laterality Date   ABDOMINAL HYSTERECTOMY  1970's   BACK SURGERY     BREAST LUMPECTOMY  12/1998   lumpectomy   CARDIAC CATHETERIZATION     CARDIOVERSION N/A 04/18/2023   Procedure: CARDIOVERSION;  Surgeon: Tessa Lerner, DO;  Location: [facility] INVASIVE CV LAB;  Service: Cardiovascular;  Laterality: N/A;   CARDIOVERSION N/A 04/20/2023   Procedure: CARDIOVERSION;  Surgeon: Yates Decamp, MD;  Location: [facility] INVASIVE CV LAB;  Service: Cardiovascular;  Laterality: N/A;   EYE SURGERY     cataract surgery bilat    INTRAOPERATIVE TRANSTHORACIC ECHOCARDIOGRAM N/A 09/03/2018   Procedure: INTRAOPERATIVE  TRANSTHORACIC ECHOCARDIOGRAM;  Surgeon: Kathleene Hazel, MD;  Location: [facility] OR;  Service: Open Heart Surgery;  Laterality: N/A;   KYPHOPLASTY N/A 09/07/2022   Procedure: THORACIC EIGHT KYPHOPLASTY;  Surgeon: Venita Lick, MD;  Location: [facility] OR;  Service: Orthopedics;  Laterality: N/A;  1 hr Local with [facility] 3 C-Bed   LITHOTRIPSY     Right total knee     2018 Dr. Charlann Boxer   RIGHT/LEFT HEART CATH AND CORONARY ANGIOGRAPHY N/A 08/06/2018   Procedure: RIGHT/LEFT HEART CATH AND CORONARY ANGIOGRAPHY;  Surgeon: Yates Decamp, MD;  Location: [facility] INVASIVE CV LAB;  Service: Cardiovascular;  Laterality: N/A;   THYROIDECTOMY, PARTIAL  1975   TONSILLECTOMY     as a child - patient not sure of exact date   TOTAL KNEE ARTHROPLASTY Left 03/13/2016   Procedure: TOTAL KNEE ARTHROPLASTY;  Surgeon: Durene Romans, MD;  Location: [facility] ORS;  Service: Orthopedics;  Laterality: Left;   TOTAL KNEE ARTHROPLASTY Right 06/18/2017   Procedure: RIGHT TOTAL KNEE ARTHROPLASTY;  Surgeon: Durene Romans, MD;  Location: [facility] ORS;  Service: Orthopedics;  Laterality: Right;   TRANSCATHETER AORTIC VALVE REPLACEMENT, TRANSFEMORAL N/A 09/03/2018   Procedure: TRANSCATHETER AORTIC VALVE REPLACEMENT, TRANSFEMORAL;  Surgeon: Kathleene Hazel, MD;  Location: [facility] OR;  Service: Open  Heart Surgery;  Laterality: N/A;   Patient Active Problem List   Diagnosis Date Noted   Chronic anticoagulation 10/02/2023   Vasospastic angina (HCC) 10/01/2023   History of coronary vasospasm 10/01/2023   NSTEMI (non-ST elevated myocardial infarction) (HCC) 10/01/2023   Hypertensive urgency 07/16/2023   Urinary tract infection due to Klebsiella species 07/16/2023   Chronic heart failure with preserved ejection fraction (HFpEF) (HCC) 07/16/2023   Hypokalemia 07/16/2023   Hypocalcemia 07/16/2023   Atypical atrial flutter (HCC) 04/19/2023   Acute on chronic diastolic heart failure (HCC) 04/19/2023   Paroxysmal atrial fibrillation with rapid  ventricular response (HCC) 04/18/2023   History of stroke 04/16/2023   Fall at home, initial encounter 04/16/2023   Atrial fib/flutter, transient (HCC) 04/16/2023   Paroxysmal atrial fibrillation (HCC) 04/15/2023   Elevated troponin 04/15/2023   Chest pain 04/13/2023   Stroke due to embolism (HCC) 03/20/2023   Chest pain, rule out acute myocardial infarction 02/17/2023   Chemotherapy-induced neuropathy (HCC) 11/28/2022   Pain due to onychomycosis of toenails of both feet 11/08/2022   Multiple myeloma (HCC) 07/24/2022   Chronic diastolic CHF (congestive heart failure) (HCC) 05/16/2022   Symptomatic anemia 05/16/2022   Hyponatremia 04/02/2022   Microcytic anemia 04/02/2022   S/P TAVR (transcatheter aortic valve replacement) 09/03/2018   Hypothyroidism    Hypertension    Hyperlipidemia    GERD (gastroesophageal reflux disease)    S/P right TKA 06/18/2017   Class 1 obesity 03/15/2016   S/P left TKA 03/13/2016   History of breast cancer, DCIS, lumpectomy March 2000 12/13/2011    PCP: Dr. Irena Reichmann  REFERRING PROVIDER: Dr. Jeanie Sewer, IV  REFERRING DIAG: Multiple Myeloma  THERAPY DIAG:  Multiple myeloma without remission (HCC)  Muscle weakness (generalized)  Difficulty walking  Balance problem  Neck pain  ONSET DATE: 07/2022  Rationale for Evaluation and Treatment: Rehabilitation                                                                                                                                                                                           SUBJECTIVE STATEMENT:   I ache all over today for some reason.  I feel a little wobbly. I didn't sleep well last night.  ZO:[id number] 11/08/23 :pt was last seen in PT on 09/27/2023. Following that she was seen in the ED on 09/30/2022 for  Afib which returned to sinus rhythm with vagal maneuvers and later  Non ST elevated myocardial infarction. She has chronic diastolic CHF, and had a TAVR on 09/03/2018. She has been  given approval by cardiology to return to PT with script in chart but will do  re-eval with prior ordering physician.I still get dizzy especially in the am before the medications. There is some pain in the anterior thighs with movement and lying down. It seems to be continuous;it doesn't get better or worse. I can get up better from a solid chair than a soft chair. I still feel like my balance is off.   eval I need to get my balance better so I can move without fear of falling. Difficult getting up and down from chairs, Some fatigue with walking short distances;ie car into clinic. Reports some foot numbness if she is cold but feels good with socks on. Her grandson lives with her, but she has a home health aid in the day time and the night time for safety PERTINENT HISTORY:  Pt was diagnosed with  IgGMultiple Myeloma in 07/2022 after a bone Marrow biopsy showed 60% plasma cells and anemia. No lytic lesions. She was started on chemotherapy. She had surgery for T 8 compression fracture kyphoplasty on 09/07/2022. She was hospitalized 04/15/2023 for Atrial Fibrillation with cardioversion attempted on 04/18/2023. She had some PT at that time. She has a home health aide that drives her to appts and helps with keeping her house clean And someone at night to help her get to the bathroom  PMH significant for A-fib on Eliquis and amiodarone, hypothyroidism, HTN, breast cancer, multiple myeloma,aortic stenosis status post TAVR, hx of TIA, Macular degeneration, Head aches she thinks related to her eyes PAIN:  PAIN:  Are you having pain? Yes NPRS scale: 6-7/10 neck, 6/10 left greater than right gastroc, eyes Pain orientation: Bilateral , left greater than right PAIN TYPE: calf;tight Pain description: Aggravating factors: relieving factors: rest.   PRECAUTIONS: PMH significant of A-fib on Eliquis and amiodarone, hypothyroidism, HTN, Left breast cancer, multiple myeloma,aortic stenosis status post TAVR, hx of TIA,  T8 compression fx with kyphoplasty  RED FLAGS: Bowel or bladder incontinence: Yes:   and Compression fracture: Yes: had a kyphoplasty    WEIGHT BEARING RESTRICTIONS: No  FALLS:  Has patient fallen in last 6 months? Yes. Number of falls 1, fell in community garden: no injury , 09/14/2023 Larey Seat in home today  LIVING ENVIRONMENT: Lives with: Lucila Maine lives with her presently Lives in: House/apartment Stairs: No;  Has following equipment at home: Single point cane, Environmental consultant - 4 wheeled, and shower chair  OCCUPATION: Retired  LEISURE: read,visit with friends  PRIOR LEVEL OF FUNCTION: Independent with basic ADLs, Independent with household mobility with device, and Needs assistance with homemaking  PATIENT GOALS: Be able to walk more securely, not be dependent on a walker/rollator   OBJECTIVE:  COGNITION: Overall cognitive status: deficits  PALPATION:   OBSERVATIONS / OTHER ASSESSMENTS: Ambulates with rollator, flexed posture/increased kyphosis  SENSATION: Light touch: deficits   POSTURE: forward head, rounded shoulders, increased thoracic kyphosis  CERVICAL AROM: Flexion WNL, NP,right ROT Dec. 50% pain on left, Left Rot dec 60% pain on left,Right SB Dec 60% pain/tight, left SB dec 90% pain Tender B UT, posterior cervicals suboccipitals greatest on the left  LOWER EXTREMITY STRENGTH:   Right RotMMT Right eval RIGHT 09/14/2023  Hip flexion 4- 4+  Hip extension Able to bridge Can bridge  Hip abduction 4- 4+  Hip adduction 4 -seated 4+  Hip internal rotation 3- 3-  Hip external rotation 3+ 3  Knee flexion 4+ 5  Knee extension 4+ 4+ pain  Ankle dorsiflexion 5 5  Ankle plantarflexion    Ankle inversion    Ankle eversion  Great toe extension     (Blank rows = not tested)  MMT LEFT eval LEFT 09/14/2023  Hip flexion 4 4, knee pain  Hip extension Able to bridge Can bridge  Hip abduction 4- 4  Hip adduction 4- 4+  Hip internal rotation 3+ 4-  Hip external  rotation 3 3-  Knee flexion 4+ 5  Knee extension 4 4+ pain  Ankle dorsiflexion 5 5  Ankle plantarflexion    Ankle inversion    Ankle eversion    Great toe extension      (Blank rows = not tested)  LYMPHEDEMA ASSESSMENTS:   SURGERY TYPE/DATE: Left lumpectomy 2000  NUMBER OF LYMPH NODES REMOVED: Thinks so, not sure how many  CHEMOTHERAPY: Yes for Multiple Myeloma, No for Breast Cancer  RADIATION:No for Breast Cancer  HORMONE TREATMENT: Yes for Breast Cancer  INFECTIONS: NO  FUNCTIONAL TESTS:  30 seconds sit to stand test: EVAL: 5 repetitions with use of Ue's 09/14/2023 ; 5 repetitions with minimal use of Ue's, but some knee pain after fall today 11/08/2023: 6 with light use of both hands 12/18/2023  5 repetitions with use of both hands  Timed up and go: 30.4 seconds with rollator and use of hands to rise from chair 09/14/2023  2 trials 23 seconds, 20 seconds 11/08/2023:    23.01 22.27 seconds with rollator and using hands to rise 12/18/2023 Avg of 2 trials 24.2 seconds  Four position balance test:  able to maintain DLS feet together x 10 sec, and half tandem with left foot forward x 10 sec. , unable to hold half tandem with right foot forward for greater than 3 seconds, unable to maintain tandem stance bilaterally for more than a few seconds. Did not test SLS 11/07/2023 Half tandem right forward 23 sec, left forward 22 GAIT: Distance walked: Nustep to ramp in front of building Assistive device utilized: Rollator Level of assistance: Modified independence Comments:flexed posture, increased kyphosis    Outcome measure:  TODAY'S TREATMENT:                                                                                                                                          DATE:  12/27/2023 O2 98%, HR 71 BPM Nu Step seat 8, UE 9, Lev 4, x 5 min Heel raises x 10 with HH, x 10 no HH Marching x 10 with HH, 2 x 10 no HH Tandem stance x 5 ea side to failure Standing hip abd x  12 ea side holding bar seated rest Ambulated 2 x 1050 ft with rollator and CGA DF stretch wit strp 3 x 25 sec Sit to stand with purple ax in chair;2 x 5 no hands Ambulated in cancer gym 1 lap with SPC on right and CGA of PT for endurance and with emphasis on erect posture and cane placement Ax beam in bars holding bars;2 lengths forward and  2 lengths sidestepping with CGA 12/25/2023 Nu Step seat 8, UE 9, Lev 4, 367 steps Heel raises x 10 with HH, x 10 no HH Marching x 10 with HH, x 10 no HH Sidestepping 4 lengths in bar emphasis on erect posture Semi tandem stance B with intermittent hand hold, tandem stance with intermittent hand hold to failure Calf stretch with strap seated x 2 ea, standing calf stretch in bars with lunge x 2 ea Ambulated 2 x 150 ft with rollator seated rest between  Ambulated around cancer gym x 1 with SPC; pt felt slightly wobbly so pt rested before leaving  12/20/2023 O2 97%, HR 61 Nu Step seat 8, UE 9, Lev 4, 348 steps Heel raises x 10 with HH, x 10 no HH Marching x 10 with HH, x 10 no HH Sidestepping 4 lengths in bar emphasis on erect posture Semi tandem stance B with intermittent hand hold, tandem stance with intermittent hand hold Ambulated to ortho gym with cane in right UE and stopped to rest in chair. Good balance, erect posture. Emphasis on cane placement Ambulated back ot cancer gym; tried cane in left hand but returned back to right hand half way down as pt was losing her balance to the left. Seated brief rest Sit to stand from chair with ax 2 x 5 no hands Step ups on ax x 10 ea side with light hand touch Standing on foam ax; feet apart, feet together eyes open; notable sway but worse with feet together x 30 sec each 12/18/2023 O2 98%, HR 66 30 sec sit to stand: 5 reps TUG; 2 trial avg 24.2 sec Assessed cervical ROM for recert Messaged MD to add cervical referral Nu step x 4 min seat 8, UE 9 Lev 4 x 4  min stopping due to pt fatigue Educated in  importance of erect postrue for decreasing neck pain. Educated in towel roll for pillow, and lumbar roll for sitting to improve posture. VC to maintain good posture with head and neck throughout treatment STM to bilateral cervical region with cocoa butter with pt sitting in chair. Felt good but no overall improvement.   11/20/2023 02 99%,  HR 55 Pt arrived with her SPC and no walker today. Caregiver walked in with her. Nu Step seat 8, UE 9, L5 x 6 min, 361 steps Heel raises x 10 with HH, x 10 no HH Marching x 10 with HH, x 10 no HH Sidestepping 4 lengths in bar Incline stretch x 3 Half tandem x 1 B, tandem stance x 1 B, to failure Ambulated with SPC 116 ft seated rest, 112 ft seated rest with emphasis on cane placement  and step length , CGA of PT Sit to stand with purple cushion x 10 with CGA Ambulated 1 lap around cancer gym with SPC, CGA  11/08/2023 O2 99,  HR 57,  BP 135/59 Performed 30 sec sit to stand, TUG and half tandem stance for recert Practiced half tandem stance;pt able to hold bilateral feet forward in half tandem for greater than 20 sec. On first attempt Ambulated 52 ft, rest,and then 90 feet, then seated rest, then 97 feet with SPC in right UE and CGA emphasis on coordinating gait and cane and looking up.seated rest.   09/27/2023  Nu Step seat 8, UE 9 Lev 3 x 8 min to warm up and loosen knees and muscles In parallel bars using left hand only practice ambulating 6 lengths with single arm support Instructed in proper cane  placement and gait pattern with step through gait using SPC;pt wearing gait belt and with CGA of PT; Practiced in parallel bars x 8 lengths and then as pt improved went to gym floor.  Seated rest Practiced on straight aways x 3 and then walked half way to ortho gym with CGA of PT and emphasis on erect posture and proper step through. Seated rest Repeated walking around gym and straight aways. Pt noted to limp slightly on left LE as she increased her  distance. She was not aware of this. Also appeared to be hip abd weakness. Fully explained to pt my concerns about her walking with a cane;intermittent dizziness and fall risk. Advised her to contact pharmacist to see if any of her meds could be causing dizziness. Ishe tells me she has used the rollator atleast a year.  09/14/2023 02 99, HR 68 BP 129/68 Sit to stand :30 seconds : 5 reps with some knee pain TUG: 2 trials  23 sec, 20 sec Half tandem stance able to maintain Bilaterally x 15 sec Full tandem done bilaterally; limited to no greater than 3 sec for 2-3 trials each Tested lower extremity strength bilaterally and reviewed goals with pt.  09/11/23: Therapeutic Exercises Nu Step seat 8, UE 9 Lev 3 x 8 min, 588 steps Vital signs taken after NuStep at pts request, see Vital Signs In // bars with 1# added to each ankle for following: Heel raises x 10 with HH, x 10 without HH High march x 10 B with fingertip assist Low march x 10 with fingertips, SBA SLS on blue oval for bil hip flex and abd x 10 each (still with 1# on ankles) Seated rest after // bars and removed ankle weights Sit to stand with purple cushion in chair 2 x 5, SBA to CGA (1 posterior LOB to chair) In // bars for multiple trials of modified tandem stance bilaterally in varying positions for 10 sec at a time with fingertip support throughout, was able to get to practicing full tandem today which pt has not felt confident enough to try before. Pt ambulated from cancer gym to ortho gym and returned without rest and with rollator; SBA   09/04/23: Therapeutic Exercises Nu Step seat 8, UE 9 Lev 3 x 8 min, 605 steps In // bars: Heel raises x 10 with HH, x 10 without HH High march x 10 B with fingertip assist Low march x 10 no HH, SBA Vector reaches with handhold x 5 each returning therapist demo Sit to stand with purple cushion in chair 2 x 5, CGA In // bars for multiple trials of modified tandem stance bilaterally in  varying positions for 10 sec at a time working towards no HH On mini tramp for following: High march 2 x 5 each with +2 HH, 3 way weight shifting x 30 sec each +2 HH, then 1 trial partial tandem stance each x 30 sec with fingertips Pt ambulated from cancer gym to ortho gym and returned without rest and with rollator; SBA   08/31/23:  SpO2 98%, HR 61 bpm Therapeutic Exercises Nu Step seat 8, UE 9 Lev 3 x 8 min, 546 steps In // bars:Heel raises x 10 with HH, x 10 without HH High march x 10 B with HH Low march x 10 no HH, CGA UE reaches with SBA-CGA x 5 each side encouraging reaching until it becomes challenging Sit to stand with purple cushion in chair 2 x 5, CGA 6 in step ups  2 x 5 bilaterally with HH Foot placement on 6 in step forward 2 x 5 ea with second set no HH and sideways bilaterally x 5 ea, no HH; min VC's for erect posture throughout Pt ambulated from cancer gym to ortho gym and returned without rest and with rollator; SBA       PATIENT EDUCATION:    Gastroc stretch with strap or lunge to counter Access Code: [email or web address] URL: [email or web address] Date: 07/12/2023 Prepared by: Alvira Monday  Exercises Gastroc stretches - Supine Bridge  - 1 x daily - 7 x weekly - 1 sets - 10 reps - Seated Long Arc Quad  - 1 x daily - 7 x weekly - 1 sets - 10 reps - Standing Hip Abduction with Counter Support  - 1 x daily - 7 x weekly - 1 sets - 10 reps - Seated March  - 1 x daily - 7 x weekly - 1 sets - 10 reps - Standing March with Counter Support  - 1 x daily - 7 x weekly - 1 sets - 10 reps - Standing Heel Raise  - 1 x daily - 7 x weekly - 1 sets - 10 reps - Seated Hip Adduction Isometrics with Ball  - 1 x daily - 7 x weekly - 1 sets - 10 reps - 5 hold Education details: Discussed POC and treatment interventions including, strength, balance and function Person educated: Patient Education method: Explanation Education comprehension: verbalized understanding  HOME  EXERCISE PROGRAM:  Supine Bridge  - 1 x daily - 7 x weekly - 1 sets - 10 reps - Seated Long Arc Quad  - 1 x daily - 7 x weekly - 1 sets - 10 reps - Standing Hip Abduction with Counter Support  - 1 x daily - 7 x weekly - 1 sets - 10 reps - Seated March  - 1 x daily - 7 x weekly - 1 sets - 10 reps - Standing March with Counter Support  - 1 x daily - 7 x weekly - 1 sets - 10 reps - Standing Heel Raise  - 1 x daily - 7 x weekly - 1 sets - 10 reps - Seated Hip Adduction Isometrics with Ball  - 1 x daily - 7 x weekly - 1 sets - 10 reps - 5 hold  ASSESSMENT:  CLINICAL IMPRESSION:  Pt still fatigued more easily but works hard. She did well with sit to stand without using her arms with the purle ax in the chair. She is ambulating slowly especially when using her SPC, and needs constant VC's for erect posture. OBJECTIVE IMPAIRMENTS: decreased activity tolerance, decreased balance, decreased cognition, decreased endurance, decreased knowledge of condition, difficulty walking, decreased strength, impaired sensation, impaired vision/preception, postural dysfunction, and pain.   ACTIVITY LIMITATIONS: carrying, lifting, standing, squatting, bed mobility, dressing, and locomotion level  PARTICIPATION LIMITATIONS: meal prep, cleaning, laundry, driving, shopping, and community activity  PERSONAL FACTORS: Age and 3+ comorbidities: multiple myeloma,Macular Degeneration , Afib are also affecting patient's functional outcome.   REHAB POTENTIAL: Good  CLINICAL DECISION MAKING: Evolving/moderate complexity  EVALUATION COMPLEXITY: Low   GOALS: Goals reviewed with patient? Yes  SHORT TERM GOALS: Target date: 07/30/2023  Pt will be independent in a HEP for LE/core strength Baseline: Goal status: MET 08/14/2023 2.  Pt will be able to maintain half tandem stance bilaterally x 10 sec Baseline:  Goal status:MET 08/14/2023 3.  Pt will perform 6-7 sit to stands in 30 seconds to decrease fall risk  Baseline:   Goal status:MET 10/24, 6 reps with hands 4.  Pt will perform TUG in 26 seconds to decrease fall risk Baseline:   Goal status: MET 10/24   LONG TERM GOALS: Target date: 10/25/2022 Pt will be able to maintain tandem stance for 10 seconds B to demonstrate improved balance Baseline:  Goal status:  In progress  2.  Pt will be able to perform 9 sit to stands in 30 seconds for average score to decrease fall risk Baseline:  Goal status: In Progress, 6 today with use of hands 3.  Pt will be able to perform TUG with rollator in 24 seconds or less for improved gait speed and decreased fall risk Baseline:  Goal status:MET  09/14/2023  4.  Pt will be independent in more advanced HEP Baseline:  Goal status: In Progress  5. Pt will be able to safely ambulate household distances with quad cane  GOAL STATUS; IN PROGRESS  PLAN:  PT FREQUENCY: 2x/week  PT DURATION: 4 weeks  PLANNED INTERVENTIONS: Therapeutic exercises, Therapeutic activity, Neuromuscular re-education, Balance training, Gait training, Patient/Family education, Self Care, and Manual therapy  PLAN FOR NEXT SESSION: Postural education,Gait, progress strength, balance, function, gait with quad cane at pt request.Cervical treatment as required and approved   Waynette Buttery, PT 12/27/2023, 12:06 PM

## 2023-12-28 ENCOUNTER — Encounter (INDEPENDENT_AMBULATORY_CARE_PROVIDER_SITE_OTHER): Admitting: Ophthalmology

## 2023-12-28 DIAGNOSIS — I10 Essential (primary) hypertension: Secondary | ICD-10-CM

## 2023-12-28 DIAGNOSIS — H353231 Exudative age-related macular degeneration, bilateral, with active choroidal neovascularization: Secondary | ICD-10-CM

## 2023-12-28 DIAGNOSIS — H43813 Vitreous degeneration, bilateral: Secondary | ICD-10-CM | POA: Diagnosis not present

## 2023-12-28 DIAGNOSIS — H35033 Hypertensive retinopathy, bilateral: Secondary | ICD-10-CM | POA: Diagnosis not present

## 2024-01-01 ENCOUNTER — Inpatient Hospital Stay: Payer: Medicare Other

## 2024-01-01 ENCOUNTER — Inpatient Hospital Stay: Payer: Medicare Other | Attending: Physician Assistant | Admitting: Hematology and Oncology

## 2024-01-01 VITALS — BP 153/72 | HR 59 | Temp 97.2°F | Resp 16 | Wt 164.4 lb

## 2024-01-01 DIAGNOSIS — I4891 Unspecified atrial fibrillation: Secondary | ICD-10-CM | POA: Diagnosis not present

## 2024-01-01 DIAGNOSIS — Z7901 Long term (current) use of anticoagulants: Secondary | ICD-10-CM | POA: Diagnosis not present

## 2024-01-01 DIAGNOSIS — Z79899 Other long term (current) drug therapy: Secondary | ICD-10-CM | POA: Diagnosis not present

## 2024-01-01 DIAGNOSIS — E538 Deficiency of other specified B group vitamins: Secondary | ICD-10-CM | POA: Insufficient documentation

## 2024-01-01 DIAGNOSIS — C9000 Multiple myeloma not having achieved remission: Secondary | ICD-10-CM

## 2024-01-01 DIAGNOSIS — Z79624 Long term (current) use of inhibitors of nucleotide synthesis: Secondary | ICD-10-CM | POA: Insufficient documentation

## 2024-01-01 DIAGNOSIS — Z7961 Long term (current) use of immunomodulator: Secondary | ICD-10-CM | POA: Insufficient documentation

## 2024-01-01 DIAGNOSIS — D649 Anemia, unspecified: Secondary | ICD-10-CM | POA: Diagnosis not present

## 2024-01-01 DIAGNOSIS — D696 Thrombocytopenia, unspecified: Secondary | ICD-10-CM

## 2024-01-01 LAB — CBC WITH DIFFERENTIAL (CANCER CENTER ONLY)
Abs Immature Granulocytes: 0.01 10*3/uL (ref 0.00–0.07)
Basophils Absolute: 0.1 10*3/uL (ref 0.0–0.1)
Basophils Relative: 2 %
Eosinophils Absolute: 0.3 10*3/uL (ref 0.0–0.5)
Eosinophils Relative: 6 %
HCT: 38.6 % (ref 36.0–46.0)
Hemoglobin: 12.8 g/dL (ref 12.0–15.0)
Immature Granulocytes: 0 %
Lymphocytes Relative: 15 %
Lymphs Abs: 0.7 10*3/uL (ref 0.7–4.0)
MCH: 29 pg (ref 26.0–34.0)
MCHC: 33.2 g/dL (ref 30.0–36.0)
MCV: 87.5 fL (ref 80.0–100.0)
Monocytes Absolute: 0.6 10*3/uL (ref 0.1–1.0)
Monocytes Relative: 12 %
Neutro Abs: 2.9 10*3/uL (ref 1.7–7.7)
Neutrophils Relative %: 65 %
Platelet Count: 118 10*3/uL — ABNORMAL LOW (ref 150–400)
RBC: 4.41 MIL/uL (ref 3.87–5.11)
RDW: 15.3 % (ref 11.5–15.5)
WBC Count: 4.6 10*3/uL (ref 4.0–10.5)
nRBC: 0 % (ref 0.0–0.2)

## 2024-01-01 LAB — CMP (CANCER CENTER ONLY)
ALT: 9 U/L (ref 0–44)
AST: 12 U/L — ABNORMAL LOW (ref 15–41)
Albumin: 3.7 g/dL (ref 3.5–5.0)
Alkaline Phosphatase: 73 U/L (ref 38–126)
Anion gap: 8 (ref 5–15)
BUN: 22 mg/dL (ref 8–23)
CO2: 30 mmol/L (ref 22–32)
Calcium: 8.4 mg/dL — ABNORMAL LOW (ref 8.9–10.3)
Chloride: 101 mmol/L (ref 98–111)
Creatinine: 0.99 mg/dL (ref 0.44–1.00)
GFR, Estimated: 55 mL/min — ABNORMAL LOW (ref >60)
Glucose, Bld: 104 mg/dL — ABNORMAL HIGH (ref 70–99)
Potassium: 3.6 mmol/L (ref 3.5–5.1)
Sodium: 139 mmol/L (ref 135–145)
Total Bilirubin: 0.5 mg/dL (ref 0.0–1.2)
Total Protein: 6 g/dL — ABNORMAL LOW (ref 6.5–8.1)

## 2024-01-01 LAB — LACTATE DEHYDROGENASE: LDH: 216 U/L — ABNORMAL HIGH (ref 98–192)

## 2024-01-01 NOTE — Progress Notes (Signed)
 [facility] [phone]   [phone]   PROGRESS NOTE   Patient Care Team: [facility], [facility] as PCP - General (Rheumatology)   Hematological/Oncological History # IgG Lambda Multiple Myeloma  06/14/2022: establish care with [name] due to anemia. Labs showed M protein 4.9, Kappa 7.2, Lambda 10.8, ratio 0.67 07/13/2022: Bmbx showed Lambda restricted plasma cell neoplasm involving approximately 60% of the cellular marrow by IHC on the biopsy.  08/01/2022: Cycle 1 Day 1 of VRd chemotherapy. (Holding revlimid initially) 08/21/2022:  Cycle 2 Day 1 of VRd chemotherapy. (Holding revlimid) 09/04/2022: Cycle 3 Day 1 of VRd chemotherapy. Started revlimid 10/03/2022: Cycle 4 Day 1 of VRd chemotherapy 10/31/2022: Cycle 5 Day 1 of VRd chemotherapy 11/20/2022: Cycle 6 Day 1 of VRd chemotherapy 12/11/2022: Cycle 7 Day 1 of VRd chemotherapy 01/02/2023: Cycle 8 Day 1 of VRd chemotherapy 01/22/2023: Cycle 9 Day 1 of VRd chemotherapy 02/23/2023: Day 1 Cycle 1 of Dara/Rev/Dex 03/23/2023: Day 1 Cycle 2 of Dara/Rev/Dex 05/06/2023: chemotherapy on pause due to chest pain/cardiac/respiratory issues.  05/18/2023: Cycle 3 Day 1 of Dara/Rev/Dex 06/15/2023: Cycle 4 Day 1 of Dara/Rev/Dex 07/13/2023: Cycle 5 Day 1 of Dara/Rev/Dex 08/10/2023: Cycle 6 Day 1 of Dara/Rev/Dex. Transition to maintenance revlimid.     INTERVAL HISTORY: Zoe [name] 89 y.o. female returns to the clinic today for a follow-up visit accompanied by her daughter-in-law. Her last visit was on 11/02/2023. In the interim, she continued on maintenance revlimid.   [name] reports she is tolerating her Revlimid therapy well without difficulty.  She reports that she does have continued balance issues and is working with physical therapy.  She reports that she continues to have some occasional lower extremity discomfort.  She also reports that she gets short of breath at night and helps when she props  herself up on a pillow or 2.  She does have albuterol which she has not yet used.  She notes that she feels like she is over her COVID infection but still has some residual cough but no nasal congestion or fever.  She reports she does also get some itchiness of her skin at night. Otherwise she is not having any other side effects as result of her Revlimid therapy.  Additionally she has no questions concerns or complaints today. A full 10 point ROS is otherwise negative.  She denies any fevers, chills, sweats, shortness of breath, chest pain or cough.  She has no other complaints.  A full 10 point ROS is otherwise negative.  MEDICAL HISTORY: Past Medical History:  Diagnosis Date   Arthritis    some - per patient   Breast cancer (HCC)    breast cancer / left    Cataract    bilat    GERD (gastroesophageal reflux disease)    History of kidney stones    Hyperlipidemia    Hypertension    Hypothyroidism    Macular degeneration    Left   S/P TAVR (transcatheter aortic valve replacement) 09/03/2018   23 mm Edwards Sapien 3 transcatheter heart valve placed via percutaneous right transfemoral approach    Severe aortic stenosis    Stress incontinence    Thyroid disease    Tinnitus     ALLERGIES:  is allergic to quinolones, penicillins, and sulfa antibiotics.  MEDICATIONS:  Current Outpatient Medications  Medication Sig Dispense Refill   acyclovir (ZOVIRAX) 400 MG tablet Take 1 tablet (400 mg total) by mouth 2 (two) times daily. 180 tablet 1  albuterol (VENTOLIN HFA) 108 (90 Base) MCG/ACT inhaler Inhale 2 puffs into the lungs every 6 (six) hours as needed for wheezing or shortness of breath. (Patient not taking: Reported on 10/23/2023) 8 g 2   amiodarone (PACERONE) 200 MG tablet Take 0.5 tablets (100 mg total) by mouth daily. 45 tablet 3   apixaban (ELIQUIS) 5 MG TABS tablet Take 1 tablet (5 mg total) by mouth 2 (two) times daily. 180 tablet 1   Artificial Tear Solution (SOOTHE XP) SOLN Place  1 drop into both eyes every evening.     Cholecalciferol (VITAMIN D3) 50 MCG (2000 UT) capsule Take 1 capsule (2,000 Units total) by mouth daily.     [employer] VITAMIN B12 1000 MCG tablet TAKE 1 TABLET BY MOUTH EVERY DAY 90 tablet 1   docusate sodium (COLACE) 100 MG capsule Take 100 mg by mouth daily as needed for mild constipation.     furosemide (LASIX) 20 MG tablet Take 1 tablet (20 mg total) by mouth as needed for edema.     KLOR-CON M10 10 MEQ tablet Take 10 mEq by mouth daily.     lenalidomide (REVLIMID) 10 MG capsule Take 1 capsule (10 mg total) by mouth daily. [employer] Auth # [id number]  Date Obtained 12/17/23 Take 1 capsule daily for 21 days then none for 7 days 21 capsule 0   levothyroxine (SYNTHROID, LEVOTHROID) 100 MCG tablet Take 100 mcg by mouth daily before breakfast.  2   losartan (COZAAR) 25 MG tablet Take 1 tablet (25 mg total) by mouth daily. 30 tablet 6   metoprolol tartrate (LOPRESSOR) 50 MG tablet TAKE 1 TABLET BY MOUTH TWICE A DAY 180 tablet 3   Multiple Vitamins-Minerals (PRESERVISION AREDS) CAPS Take 1 capsule by mouth 2 (two) times daily.     pantoprazole (PROTONIX) 40 MG tablet Take 1 tablet (40 mg total) by mouth daily at 6 (six) AM. 30 tablet 0   polyethylene glycol (MIRALAX / GLYCOLAX) 17 g packet Take 17 g by mouth daily as needed for mild constipation.     potassium chloride (KLOR-CON) 10 MEQ tablet Take 1 tablet (10 mEq total) by mouth as needed (TAKE WITH LASIX FOR SWELLING).     pravastatin (PRAVACHOL) 40 MG tablet Take 40 mg by mouth every evening.     No current facility-administered medications for this visit.    SURGICAL HISTORY:  Past Surgical History:  Procedure Laterality Date   ABDOMINAL HYSTERECTOMY  1970's   BACK SURGERY     BREAST LUMPECTOMY  12/1998   lumpectomy   CARDIAC CATHETERIZATION     CARDIOVERSION N/A 04/18/2023   Procedure: CARDIOVERSION;  Surgeon: Tessa Lerner, DO;  Location: [facility] INVASIVE CV LAB;  Service: Cardiovascular;  Laterality: N/A;    CARDIOVERSION N/A 04/20/2023   Procedure: CARDIOVERSION;  Surgeon: Yates Decamp, MD;  Location: [facility] INVASIVE CV LAB;  Service: Cardiovascular;  Laterality: N/A;   EYE SURGERY     cataract surgery bilat    INTRAOPERATIVE TRANSTHORACIC ECHOCARDIOGRAM N/A 09/03/2018   Procedure: INTRAOPERATIVE TRANSTHORACIC ECHOCARDIOGRAM;  Surgeon: Kathleene Hazel, MD;  Location: [facility] OR;  Service: Open Heart Surgery;  Laterality: N/A;   KYPHOPLASTY N/A 09/07/2022   Procedure: THORACIC EIGHT KYPHOPLASTY;  Surgeon: Venita Lick, MD;  Location: [facility] OR;  Service: Orthopedics;  Laterality: N/A;  1 hr Local with [facility] 3 C-Bed   LITHOTRIPSY     Right total knee     2018 Dr. Charlann Boxer   RIGHT/LEFT HEART CATH AND CORONARY ANGIOGRAPHY N/A 08/06/2018  Procedure: RIGHT/LEFT HEART CATH AND CORONARY ANGIOGRAPHY;  Surgeon: Yates Decamp, MD;  Location: [facility] INVASIVE CV LAB;  Service: Cardiovascular;  Laterality: N/A;   THYROIDECTOMY, PARTIAL  1975   TONSILLECTOMY     as a child - patient not sure of exact date   TOTAL KNEE ARTHROPLASTY Left 03/13/2016   Procedure: TOTAL KNEE ARTHROPLASTY;  Surgeon: Durene Romans, MD;  Location: [facility] ORS;  Service: Orthopedics;  Laterality: Left;   TOTAL KNEE ARTHROPLASTY Right 06/18/2017   Procedure: RIGHT TOTAL KNEE ARTHROPLASTY;  Surgeon: Durene Romans, MD;  Location: [facility] ORS;  Service: Orthopedics;  Laterality: Right;   TRANSCATHETER AORTIC VALVE REPLACEMENT, TRANSFEMORAL N/A 09/03/2018   Procedure: TRANSCATHETER AORTIC VALVE REPLACEMENT, TRANSFEMORAL;  Surgeon: Kathleene Hazel, MD;  Location: [facility] OR;  Service: Open Heart Surgery;  Laterality: N/A;   REVIEW OF SYSTEMS:   Constitutional: ( - ) fevers, ( - )  chills , ( - ) night sweats Eyes: ( - ) blurriness of vision, ( - ) double vision, ( - ) watery eyes Ears, nose, mouth, throat, and face: ( - ) mucositis, ( - ) sore throat Respiratory: ( -) cough, ( - ) dyspnea, ( - ) wheezes Cardiovascular: ( - ) palpitation, ( - ) chest  discomfort, ( +) lower extremity swelling Gastrointestinal:  ( - ) nausea, ( - ) heartburn, ( - ) change in bowel habits Skin: ( - ) abnormal skin rashes Lymphatics: ( - ) new lymphadenopathy, ( - ) easy bruising Neurological: ( - ) numbness, ( - ) tingling, ( - ) new weaknesses Behavioral/Psych: ( - ) mood change, ( - ) new changes  All other systems were reviewed with the patient and are negative.   PHYSICAL EXAMINATION:  Blood pressure (!) 153/72, pulse (!) 59, temperature (!) 97.2 F (36.2 C), temperature source Temporal, resp. rate 16, weight 164 lb 6.4 oz (74.6 kg), SpO2 98%.  ECOG PERFORMANCE STATUS: 1  GENERAL: well appearing elderly Caucasian female, alert, no distress and comfortable SKIN: skin color, texture, turgor are normal, no rashes or significant lesions EYES: conjunctiva are pink and non-injected, sclera clear LUNGS:normal breathing effort. Mild wheezing and crackles in lower lobes b/l.  HEART: irregular rhythm ,normal rate and no murmurs.  Musculoskeletal: no cyanosis of digits and no clubbing  PSYCH: alert & oriented x 3, fluent speech NEURO: no focal motor/sensory deficits    LABORATORY DATA: Lab Results  Component Value Date   WBC 4.6 01/01/2024   HGB 12.8 01/01/2024   HCT 38.6 01/01/2024   MCV 87.5 01/01/2024   PLT 118 (L) 01/01/2024      Chemistry      Component Value Date/Time   NA 139 01/01/2024 1032   NA 130 (L) 06/19/2022 1012   K 3.6 01/01/2024 1032   CL 101 01/01/2024 1032   CO2 30 01/01/2024 1032   BUN 22 01/01/2024 1032   BUN 21 06/19/2022 1012   CREATININE 0.99 01/01/2024 1032      Component Value Date/Time   CALCIUM 8.4 (L) 01/01/2024 1032   ALKPHOS 73 01/01/2024 1032   AST 12 (L) 01/01/2024 1032   ALT 9 01/01/2024 1032   BILITOT 0.5 01/01/2024 1032       RADIOGRAPHIC STUDIES:  No results found.    ASSESSMENT/PLAN:  Zoe [name] is a 89 y.o. female who presents to the clinic for evaluation for IgG lambda multiple  myeloma.   # IgG Lambda Multiple Myeloma  --diagnosis of MM confirmed with bone marrow  biopsy showing 60% plasma cells and anemia -- Bone survey shows no lytic lesions, kidney function is within normal limits.  --08/01/2022 was Cycle 1 Day 1 of VRd  -Dr. Leonides Schanz discontinued velcade on 02/05/23 due to rash. She is status post day 15 cycle #9.  -Dr. Leonides Schanz changed the care plan to Daratumumab, Revlimid, and decadron. She is scheduled for day 1 cycle #1 today  -Started Cycle 1, Day 1 of Dara/Rev/Dex on 02/23/2023.  Plan:  --Labs show white blood cell count white blood cell 4.6, hemoglobin 12.8, MCV 87.5, platelets 118 --last M protein undetectable (4.9 prior to start of therapy). Dara IFE shows no M protein detected. Normalized SFLC.  --continue revlimid 10 mg p.o. daily.  Close monitoring to assure no worsening blood counts. --Strict precautions for bleeding given.  --RTC for q 12-week visits with 4-week labs  #Dysuria/increased urinary frequency: --Recently completed Keflex therapy for UTI, still complaining of occasional dysuria and urgency.  #Hypotension/BP/Atrial Fibrillation  --Follows with cardiology, Dr. Jacinto Halim -- Recommendations include metoprolol succinate 25 mg PO daily and  amiodarone 100 mg PO daily --Continue on Eliquis 5 mg BID   #Bilateral lower extremity edema:  --Continue to wear compression stockings.    # Normocytic Anemia #Vitamin B12 Deficiency --Anemia likely driven by multiple myeloma, but may be component of vitamin B12 deficiency as well. --initial labs showed elevated MMA with B12 180 -- Continue vitamin B12 1000 mcg p.o. daily -- Continue to monitor   #Left leg/thigh neuropathy--improving: --Currently on gabapentin 900 mg nightly and 600 mg in the morning --Encouraged to try water aerobics and stretches.  --Following with Dr. Debbe Bales (ortho) to see if she is a candidate for steroid injections.  -- Patient evaluated by Dr. Barbaraann Cao thinks that this may be  neuropathy worsened by her Velcade therapy.   #Episode of right UE tremor and speech abnormality: --Evaluated by Dr. Barbaraann Cao on 03/20/2023. Presumed etiology had been atrial fibrillation, but vascular disease, atheromatous changes, are also possibly implicated concurrently.  --MRI brain from 03/13/2023 show no acute finding including no evidence of intracranial neoplastic disease. There are chronic small-vessel ischemic changes of the pons, cerebral hemispheric white matter and right cerebellum. --Educated patient to seek immediate evaluate if there are repeat episodes. --No neurological deficits identified per exam today  #Supportive Care -- chemotherapy education complete.  -- port placement not required.  --Awaiting to start Zometa/Xgeva  -- zofran 8mg  q8H PRN and compazine 10mg  PO q6H for nausea -- acyclovir 400mg  PO BID for VCZ prophylaxis -- tylenol 1000 mg q8H PRN for back pain.   No orders of the defined types were placed in this encounter.   Patient expressed understanding of the plan provided.   I have spent a total of 30 minutes minutes of face-to-face and non-face-to-face time, preparing to see the patient, performing a medically appropriate examination, counseling and educating the patient, ordering tests/procedures, documenting clinical information in the electronic health record, and care coordination.   Ulysees Barns, MD Department of Hematology/Oncology [facility] Phone: [phone] Pager: [phone] Email: [email or web address]

## 2024-01-02 ENCOUNTER — Ambulatory Visit: Payer: Medicare Other

## 2024-01-02 DIAGNOSIS — M6281 Muscle weakness (generalized): Secondary | ICD-10-CM | POA: Diagnosis not present

## 2024-01-02 DIAGNOSIS — M542 Cervicalgia: Secondary | ICD-10-CM

## 2024-01-02 DIAGNOSIS — R2689 Other abnormalities of gait and mobility: Secondary | ICD-10-CM | POA: Diagnosis not present

## 2024-01-02 DIAGNOSIS — C9000 Multiple myeloma not having achieved remission: Secondary | ICD-10-CM | POA: Diagnosis not present

## 2024-01-02 DIAGNOSIS — R262 Difficulty in walking, not elsewhere classified: Secondary | ICD-10-CM | POA: Diagnosis not present

## 2024-01-02 LAB — KAPPA/LAMBDA LIGHT CHAINS
Kappa free light chain: 24 mg/L — ABNORMAL HIGH (ref 3.3–19.4)
Kappa, lambda light chain ratio: 1.89 — ABNORMAL HIGH (ref 0.26–1.65)
Lambda free light chains: 12.7 mg/L (ref 5.7–26.3)

## 2024-01-02 NOTE — Therapy (Addendum)
 OUTPATIENT PHYSICAL THERAPY  LOWER EXTREMITY ONCOLOGY TREATMENT  Patient Name: Zoe [name] MRN: [id number] DOB:03-Jun-1934, 89 y.o., female Today's Date: 01/02/2024  END OF SESSION:  PT End of Session - 01/02/24 1003     Visit Number 21    Number of Visits 27    Date for PT Re-Evaluation 01/15/24    Authorization Type [employer]    Authorization Time Period 12/21/2023-01/18/2024    Authorization - Visit Number 3    Authorization - Number of Visits 8    PT Start Time 0959    PT Stop Time 1054    PT Time Calculation (min) 55 min    Activity Tolerance Patient tolerated treatment well;Patient limited by fatigue    Behavior During Therapy [facility] for tasks assessed/performed                Past Medical History:  Diagnosis Date   Arthritis    some - per patient   Breast cancer (HCC)    breast cancer / left    Cataract    bilat    GERD (gastroesophageal reflux disease)    History of kidney stones    Hyperlipidemia    Hypertension    Hypothyroidism    Macular degeneration    Left   S/P TAVR (transcatheter aortic valve replacement) 09/03/2018   23 mm Edwards Sapien 3 transcatheter heart valve placed via percutaneous right transfemoral approach    Severe aortic stenosis    Stress incontinence    Thyroid disease    Tinnitus    Past Surgical History:  Procedure Laterality Date   ABDOMINAL HYSTERECTOMY  1970's   BACK SURGERY     BREAST LUMPECTOMY  12/1998   lumpectomy   CARDIAC CATHETERIZATION     CARDIOVERSION N/A 04/18/2023   Procedure: CARDIOVERSION;  Surgeon: Tessa Lerner, DO;  Location: [facility] INVASIVE CV LAB;  Service: Cardiovascular;  Laterality: N/A;   CARDIOVERSION N/A 04/20/2023   Procedure: CARDIOVERSION;  Surgeon: Yates Decamp, MD;  Location: [facility] INVASIVE CV LAB;  Service: Cardiovascular;  Laterality: N/A;   EYE SURGERY     cataract surgery bilat    INTRAOPERATIVE TRANSTHORACIC ECHOCARDIOGRAM N/A 09/03/2018   Procedure: INTRAOPERATIVE TRANSTHORACIC ECHOCARDIOGRAM;   Surgeon: Kathleene Hazel, MD;  Location: [facility] OR;  Service: Open Heart Surgery;  Laterality: N/A;   KYPHOPLASTY N/A 09/07/2022   Procedure: THORACIC EIGHT KYPHOPLASTY;  Surgeon: Venita Lick, MD;  Location: [facility] OR;  Service: Orthopedics;  Laterality: N/A;  1 hr Local with [facility] 3 C-Bed   LITHOTRIPSY     Right total knee     2018 Dr. Charlann Boxer   RIGHT/LEFT HEART CATH AND CORONARY ANGIOGRAPHY N/A 08/06/2018   Procedure: RIGHT/LEFT HEART CATH AND CORONARY ANGIOGRAPHY;  Surgeon: Yates Decamp, MD;  Location: [facility] INVASIVE CV LAB;  Service: Cardiovascular;  Laterality: N/A;   THYROIDECTOMY, PARTIAL  1975   TONSILLECTOMY     as a child - patient not sure of exact date   TOTAL KNEE ARTHROPLASTY Left 03/13/2016   Procedure: TOTAL KNEE ARTHROPLASTY;  Surgeon: Durene Romans, MD;  Location: [facility] ORS;  Service: Orthopedics;  Laterality: Left;   TOTAL KNEE ARTHROPLASTY Right 06/18/2017   Procedure: RIGHT TOTAL KNEE ARTHROPLASTY;  Surgeon: Durene Romans, MD;  Location: [facility] ORS;  Service: Orthopedics;  Laterality: Right;   TRANSCATHETER AORTIC VALVE REPLACEMENT, TRANSFEMORAL N/A 09/03/2018   Procedure: TRANSCATHETER AORTIC VALVE REPLACEMENT, TRANSFEMORAL;  Surgeon: Kathleene Hazel, MD;  Location: [facility] OR;  Service: Open Heart Surgery;  Laterality: N/A;  Patient Active Problem List   Diagnosis Date Noted   Chronic anticoagulation 10/02/2023   Vasospastic angina (HCC) 10/01/2023   History of coronary vasospasm 10/01/2023   NSTEMI (non-ST elevated myocardial infarction) (HCC) 10/01/2023   Hypertensive urgency 07/16/2023   Urinary tract infection due to Klebsiella species 07/16/2023   Chronic heart failure with preserved ejection fraction (HFpEF) (HCC) 07/16/2023   Hypokalemia 07/16/2023   Hypocalcemia 07/16/2023   Atypical atrial flutter (HCC) 04/19/2023   Acute on chronic diastolic heart failure (HCC) 04/19/2023   Paroxysmal atrial fibrillation with rapid ventricular response (HCC) 04/18/2023    History of stroke 04/16/2023   Fall at home, initial encounter 04/16/2023   Atrial fib/flutter, transient (HCC) 04/16/2023   Paroxysmal atrial fibrillation (HCC) 04/15/2023   Elevated troponin 04/15/2023   Chest pain 04/13/2023   Stroke due to embolism (HCC) 03/20/2023   Chest pain, rule out acute myocardial infarction 02/17/2023   Chemotherapy-induced neuropathy (HCC) 11/28/2022   Pain due to onychomycosis of toenails of both feet 11/08/2022   Multiple myeloma (HCC) 07/24/2022   Chronic diastolic CHF (congestive heart failure) (HCC) 05/16/2022   Symptomatic anemia 05/16/2022   Hyponatremia 04/02/2022   Microcytic anemia 04/02/2022   S/P TAVR (transcatheter aortic valve replacement) 09/03/2018   Hypothyroidism    Hypertension    Hyperlipidemia    GERD (gastroesophageal reflux disease)    S/P right TKA 06/18/2017   Class 1 obesity 03/15/2016   S/P left TKA 03/13/2016   History of breast cancer, DCIS, lumpectomy March 2000 12/13/2011    PCP: Dr. Irena Reichmann  REFERRING PROVIDER: Dr. Jeanie Sewer, IV  REFERRING DIAG: Multiple Myeloma  THERAPY DIAG:  Multiple myeloma without remission (HCC)  Muscle weakness (generalized)  Difficulty walking  Balance problem  Neck pain  ONSET DATE: 07/2022  Rationale for Evaluation and Treatment: Rehabilitation                                                                                                                                                                                           SUBJECTIVE STATEMENT:  I woke up at 3:30 this morning so I am just not feeling as good usual.   WU:JWJX 11/08/23 :pt was last seen in PT on 09/27/2023. Following that she was seen in the ED on 09/30/2022 for  Afib which returned to sinus rhythm with vagal maneuvers and later  Non ST elevated myocardial infarction. She has chronic diastolic CHF, and had a TAVR on 09/03/2018. She has been given approval by cardiology to return to PT with script in chart  but will do re-eval with prior ordering physician.I still get dizzy especially in the  am before the medications. There is some pain in the anterior thighs with movement and lying down. It seems to be continuous;it doesn't get better or worse. I can get up better from a solid chair than a soft chair. I still feel like my balance is off.   eval I need to get my balance better so I can move without fear of falling. Difficult getting up and down from chairs, Some fatigue with walking short distances;ie car into clinic. Reports some foot numbness if she is cold but feels good with socks on. Her grandson lives with her, but she has a home health aid in the day time and the night time for safety PERTINENT HISTORY:  Pt was diagnosed with  IgGMultiple Myeloma in 07/2022 after a bone Marrow biopsy showed 60% plasma cells and anemia. No lytic lesions. She was started on chemotherapy. She had surgery for T 8 compression fracture kyphoplasty on 09/07/2022. She was hospitalized 04/15/2023 for Atrial Fibrillation with cardioversion attempted on 04/18/2023. She had some PT at that time. She has a home health aide that drives her to appts and helps with keeping her house clean And someone at night to help her get to the bathroom  PMH significant for A-fib on Eliquis and amiodarone, hypothyroidism, HTN, breast cancer, multiple myeloma,aortic stenosis status post TAVR, hx of TIA, Macular degeneration, Head aches she thinks related to her eyes PAIN:  PAIN:  Are you having pain? Yes NPRS scale: did not rate Pain orientation: Bilateral eyes PAIN TYPE: burning Pain description: intermittent Aggravating factors:chemo shot   Relieving factors: rest.   PRECAUTIONS: PMH significant of A-fib on Eliquis and amiodarone, hypothyroidism, HTN, Left breast cancer, multiple myeloma,aortic stenosis status post TAVR, hx of TIA, T8 compression fx with kyphoplasty  RED FLAGS: Bowel or bladder incontinence: Yes:   and Compression  fracture: Yes: had a kyphoplasty    WEIGHT BEARING RESTRICTIONS: No  FALLS:  Has patient fallen in last 6 months? Yes. Number of falls 1, fell in community garden: no injury , 09/14/2023 Larey Seat in home today  LIVING ENVIRONMENT: Lives with: Lucila Maine lives with her presently Lives in: House/apartment Stairs: No;  Has following equipment at home: Single point cane, Environmental consultant - 4 wheeled, and shower chair  OCCUPATION: Retired  LEISURE: read,visit with friends  PRIOR LEVEL OF FUNCTION: Independent with basic ADLs, Independent with household mobility with device, and Needs assistance with homemaking  PATIENT GOALS: Be able to walk more securely, not be dependent on a walker/rollator   OBJECTIVE:  COGNITION: Overall cognitive status: deficits  PALPATION:   OBSERVATIONS / OTHER ASSESSMENTS: Ambulates with rollator, flexed posture/increased kyphosis  SENSATION: Light touch: deficits   POSTURE: forward head, rounded shoulders, increased thoracic kyphosis  CERVICAL AROM: Flexion WNL, NP,right ROT Dec. 50% pain on left, Left Rot dec 60% pain on left,Right SB Dec 60% pain/tight, left SB dec 90% pain Tender B UT, posterior cervicals suboccipitals greatest on the left  LOWER EXTREMITY STRENGTH:   Right RotMMT Right eval RIGHT 09/14/2023  Hip flexion 4- 4+  Hip extension Able to bridge Can bridge  Hip abduction 4- 4+  Hip adduction 4 -seated 4+  Hip internal rotation 3- 3-  Hip external rotation 3+ 3  Knee flexion 4+ 5  Knee extension 4+ 4+ pain  Ankle dorsiflexion 5 5  Ankle plantarflexion    Ankle inversion    Ankle eversion    Great toe extension     (Blank rows = not tested)  MMT LEFT eval  LEFT 09/14/2023  Hip flexion 4 4, knee pain  Hip extension Able to bridge Can bridge  Hip abduction 4- 4  Hip adduction 4- 4+  Hip internal rotation 3+ 4-  Hip external rotation 3 3-  Knee flexion 4+ 5  Knee extension 4 4+ pain  Ankle dorsiflexion 5 5  Ankle plantarflexion     Ankle inversion    Ankle eversion    Great toe extension      (Blank rows = not tested)  LYMPHEDEMA ASSESSMENTS:   SURGERY TYPE/DATE: Left lumpectomy 2000  NUMBER OF LYMPH NODES REMOVED: Thinks so, not sure how many  CHEMOTHERAPY: Yes for Multiple Myeloma, No for Breast Cancer  RADIATION:No for Breast Cancer  HORMONE TREATMENT: Yes for Breast Cancer  INFECTIONS: NO  FUNCTIONAL TESTS:  30 seconds sit to stand test: EVAL: 5 repetitions with use of Ue's 09/14/2023 ; 5 repetitions with minimal use of Ue's, but some knee pain after fall today 11/08/2023: 6 with light use of both hands 12/18/2023  5 repetitions with use of both hands  Timed up and go: 30.4 seconds with rollator and use of hands to rise from chair 09/14/2023  2 trials 23 seconds, 20 seconds 11/08/2023:    23.01 22.27 seconds with rollator and using hands to rise 12/18/2023 Avg of 2 trials 24.2 seconds  Four position balance test:  able to maintain DLS feet together x 10 sec, and half tandem with left foot forward x 10 sec. , unable to hold half tandem with right foot forward for greater than 3 seconds, unable to maintain tandem stance bilaterally for more than a few seconds. Did not test SLS 11/07/2023 Half tandem right forward 23 sec, left forward 22 GAIT: Distance walked: Nustep to ramp in front of building Assistive device utilized: Rollator Level of assistance: Modified independence Comments:flexed posture, increased kyphosis    Outcome measure:  TODAY'S TREATMENT:                                                                                                                                          DATE:  01/02/24: O2 99%, HR 64 BPM Therapeutic Exercises and Neuro Re Ed Nu Step seat 8, UE 9, Lev 4, x 5 min In // bars: Alt marching with +2 HHA 2 x 15 Heel raises x 20 with fingertip support Tandem stance x 5 ea side to failure, then same with narrow BOS Seated rest break Gait with SPC and gait belt for  CGA x 175 feet Seated rest break Sit to stand with purple ax in chair 2 x 5 no hands Using // bars for DF stretch x 2 reps, 20 sec holds each returning therapist demo Self Care During seated rest breaks discussed ways pt could incorporate HEP into her day and short walks and how this will help improve her overall strength and endurance.   12/27/2023 O2 98%, HR  71 BPM Nu Step seat 8, UE 9, Lev 4, x 5 min Heel raises x 10 with HH, x 10 no HH Marching x 10 with HH, 2 x 10 no HH Tandem stance x 5 ea side to failure Standing hip abd x 12 ea side holding bar seated rest Ambulated 2 x 1050 ft with rollator and CGA DF stretch wit strp 3 x 25 sec Sit to stand with purple ax in chair;2 x 5 no hands Ambulated in cancer gym 1 lap with SPC on right and CGA of PT for endurance and with emphasis on erect posture and cane placement Ax beam in bars holding bars;2 lengths forward and 2 lengths sidestepping with CGA  12/25/2023 Nu Step seat 8, UE 9, Lev 4, 367 steps Heel raises x 10 with HH, x 10 no HH Marching x 10 with HH, x 10 no HH Sidestepping 4 lengths in bar emphasis on erect posture Semi tandem stance B with intermittent hand hold, tandem stance with intermittent hand hold to failure Calf stretch with strap seated x 2 ea, standing calf stretch in bars with lunge x 2 ea Ambulated 2 x 150 ft with rollator seated rest between  Ambulated around cancer gym x 1 with SPC; pt felt slightly wobbly so pt rested before leaving  12/20/2023 O2 97%, HR 61 Nu Step seat 8, UE 9, Lev 4, 348 steps Heel raises x 10 with HH, x 10 no HH Marching x 10 with HH, x 10 no HH Sidestepping 4 lengths in bar emphasis on erect posture Semi tandem stance B with intermittent hand hold, tandem stance with intermittent hand hold Ambulated to ortho gym with cane in right UE and stopped to rest in chair. Good balance, erect posture. Emphasis on cane placement Ambulated back ot cancer gym; tried cane in left hand but returned back  to right hand half way down as pt was losing her balance to the left. Seated brief rest Sit to stand from chair with ax 2 x 5 no hands Step ups on ax x 10 ea side with light hand touch Standing on foam ax; feet apart, feet together eyes open; notable sway but worse with feet together x 30 sec each 12/18/2023 O2 98%, HR 66 30 sec sit to stand: 5 reps TUG; 2 trial avg 24.2 sec Assessed cervical ROM for recert Messaged MD to add cervical referral Nu step x 4 min seat 8, UE 9 Lev 4 x 4  min stopping due to pt fatigue Educated in importance of erect postrue for decreasing neck pain. Educated in towel roll for pillow, and lumbar roll for sitting to improve posture. VC to maintain good posture with head and neck throughout treatment STM to bilateral cervical region with cocoa butter with pt sitting in chair. Felt good but no overall improvement.   11/20/2023 02 99%,  HR 55 Pt arrived with her SPC and no walker today. Caregiver walked in with her. Nu Step seat 8, UE 9, L5 x 6 min, 361 steps Heel raises x 10 with HH, x 10 no HH Marching x 10 with HH, x 10 no HH Sidestepping 4 lengths in bar Incline stretch x 3 Half tandem x 1 B, tandem stance x 1 B, to failure Ambulated with SPC 116 ft seated rest, 112 ft seated rest with emphasis on cane placement  and step length , CGA of PT Sit to stand with purple cushion x 10 with CGA Ambulated 1 lap around cancer gym with SPC, CGA  11/08/2023 O2 99,  HR 57,  BP 135/59 Performed 30 sec sit to stand, TUG and half tandem stance for recert Practiced half tandem stance;pt able to hold bilateral feet forward in half tandem for greater than 20 sec. On first attempt Ambulated 52 ft, rest,and then 90 feet, then seated rest, then 97 feet with SPC in right UE and CGA emphasis on coordinating gait and cane and looking up.seated rest.   09/27/2023  Nu Step seat 8, UE 9 Lev 3 x 8 min to warm up and loosen knees and muscles In parallel bars using left hand only  practice ambulating 6 lengths with single arm support Instructed in proper cane placement and gait pattern with step through gait using SPC;pt wearing gait belt and with CGA of PT; Practiced in parallel bars x 8 lengths and then as pt improved went to gym floor.  Seated rest Practiced on straight aways x 3 and then walked half way to ortho gym with CGA of PT and emphasis on erect posture and proper step through. Seated rest Repeated walking around gym and straight aways. Pt noted to limp slightly on left LE as she increased her distance. She was not aware of this. Also appeared to be hip abd weakness. Fully explained to pt my concerns about her walking with a cane;intermittent dizziness and fall risk. Advised her to contact pharmacist to see if any of her meds could be causing dizziness. Ishe tells me she has used the rollator atleast a year.  09/14/2023 02 99, HR 68 BP 129/68 Sit to stand :30 seconds : 5 reps with some knee pain TUG: 2 trials  23 sec, 20 sec Half tandem stance able to maintain Bilaterally x 15 sec Full tandem done bilaterally; limited to no greater than 3 sec for 2-3 trials each Tested lower extremity strength bilaterally and reviewed goals with pt.  09/11/23: Therapeutic Exercises Nu Step seat 8, UE 9 Lev 3 x 8 min, 588 steps Vital signs taken after NuStep at pts request, see Vital Signs In // bars with 1# added to each ankle for following: Heel raises x 10 with HH, x 10 without HH High march x 10 B with fingertip assist Low march x 10 with fingertips, SBA SLS on blue oval for bil hip flex and abd x 10 each (still with 1# on ankles) Seated rest after // bars and removed ankle weights Sit to stand with purple cushion in chair 2 x 5, SBA to CGA (1 posterior LOB to chair) In // bars for multiple trials of modified tandem stance bilaterally in varying positions for 10 sec at a time with fingertip support throughout, was able to get to practicing full tandem today which pt  has not felt confident enough to try before. Pt ambulated from cancer gym to ortho gym and returned without rest and with rollator; SBA   09/04/23: Therapeutic Exercises Nu Step seat 8, UE 9 Lev 3 x 8 min, 605 steps In // bars: Heel raises x 10 with HH, x 10 without HH High march x 10 B with fingertip assist Low march x 10 no HH, SBA Vector reaches with handhold x 5 each returning therapist demo Sit to stand with purple cushion in chair 2 x 5, CGA In // bars for multiple trials of modified tandem stance bilaterally in varying positions for 10 sec at a time working towards no HH On mini tramp for following: High march 2 x 5 each with +2 HH, 3 way weight  shifting x 30 sec each +2 HH, then 1 trial partial tandem stance each x 30 sec with fingertips Pt ambulated from cancer gym to ortho gym and returned without rest and with rollator; SBA   08/31/23:  SpO2 98%, HR 61 bpm Therapeutic Exercises Nu Step seat 8, UE 9 Lev 3 x 8 min, 546 steps In // bars:Heel raises x 10 with HH, x 10 without HH High march x 10 B with HH Low march x 10 no HH, CGA UE reaches with SBA-CGA x 5 each side encouraging reaching until it becomes challenging Sit to stand with purple cushion in chair 2 x 5, CGA 6 in step ups 2 x 5 bilaterally with HH Foot placement on 6 in step forward 2 x 5 ea with second set no HH and sideways bilaterally x 5 ea, no HH; min VC's for erect posture throughout Pt ambulated from cancer gym to ortho gym and returned without rest and with rollator; SBA       PATIENT EDUCATION:    Gastroc stretch with strap or lunge to counter Access Code: [email or web address] URL: [email or web address] Date: 07/12/2023 Prepared by: Alvira Monday  Exercises Gastroc stretches - Supine Bridge  - 1 x daily - 7 x weekly - 1 sets - 10 reps - Seated Long Arc Quad  - 1 x daily - 7 x weekly - 1 sets - 10 reps - Standing Hip Abduction with Counter Support  - 1 x daily - 7 x weekly - 1 sets - 10 reps -  Seated March  - 1 x daily - 7 x weekly - 1 sets - 10 reps - Standing March with Counter Support  - 1 x daily - 7 x weekly - 1 sets - 10 reps - Standing Heel Raise  - 1 x daily - 7 x weekly - 1 sets - 10 reps - Seated Hip Adduction Isometrics with Ball  - 1 x daily - 7 x weekly - 1 sets - 10 reps - 5 hold Education details: Discussed POC and treatment interventions including, strength, balance and function Person educated: Patient Education method: Explanation Education comprehension: verbalized understanding  HOME EXERCISE PROGRAM:  Supine Bridge  - 1 x daily - 7 x weekly - 1 sets - 10 reps - Seated Long Arc Quad  - 1 x daily - 7 x weekly - 1 sets - 10 reps - Standing Hip Abduction with Counter Support  - 1 x daily - 7 x weekly - 1 sets - 10 reps - Seated March  - 1 x daily - 7 x weekly - 1 sets - 10 reps - Standing March with Counter Support  - 1 x daily - 7 x weekly - 1 sets - 10 reps - Standing Heel Raise  - 1 x daily - 7 x weekly - 1 sets - 10 reps - Seated Hip Adduction Isometrics with Ball  - 1 x daily - 7 x weekly - 1 sets - 10 reps - 5 hold  ASSESSMENT:  CLINICAL IMPRESSION: Pt reports feeling more fatigued than usual today so more seated rest breaks. During that time answered pts questions regarding how she can increase strength and endurance at home. Educated her that the more she can sprinkle exercises and short walks into her day at home the more improvement she will see. Discussed ways she could do this during her rest breaks.    OBJECTIVE IMPAIRMENTS: decreased activity tolerance, decreased balance, decreased cognition, decreased endurance, decreased knowledge of condition, difficulty  walking, decreased strength, impaired sensation, impaired vision/preception, postural dysfunction, and pain.   ACTIVITY LIMITATIONS: carrying, lifting, standing, squatting, bed mobility, dressing, and locomotion level  PARTICIPATION LIMITATIONS: meal prep, cleaning, laundry, driving, shopping,  and community activity  PERSONAL FACTORS: Age and 3+ comorbidities: multiple myeloma,Macular Degeneration , Afib are also affecting patient's functional outcome.   REHAB POTENTIAL: Good  CLINICAL DECISION MAKING: Evolving/moderate complexity  EVALUATION COMPLEXITY: Low   GOALS: Goals reviewed with patient? Yes  SHORT TERM GOALS: Target date: 07/30/2023  Pt will be independent in a HEP for LE/core strength Baseline: Goal status: MET 08/14/2023 2.  Pt will be able to maintain half tandem stance bilaterally x 10 sec Baseline:  Goal status:MET 08/14/2023 3.  Pt will perform 6-7 sit to stands in 30 seconds to decrease fall risk Baseline:  Goal status:MET 10/24, 6 reps with hands 4.  Pt will perform TUG in 26 seconds to decrease fall risk Baseline:   Goal status: MET 10/24   LONG TERM GOALS: Target date: 10/25/2022 Pt will be able to maintain tandem stance for 10 seconds B to demonstrate improved balance Baseline:  Goal status:  In progress  2.  Pt will be able to perform 9 sit to stands in 30 seconds for average score to decrease fall risk Baseline:  Goal status: In Progress, 6 today with use of hands 3.  Pt will be able to perform TUG with rollator in 24 seconds or less for improved gait speed and decreased fall risk Baseline:  Goal status:MET  09/14/2023  4.  Pt will be independent in more advanced HEP Baseline:  Goal status: In Progress  5. Pt will be able to safely ambulate household distances with quad cane  GOAL STATUS; IN PROGRESS  PLAN:  PT FREQUENCY: 2x/week  PT DURATION: 4 weeks  PLANNED INTERVENTIONS: Therapeutic exercises, Therapeutic activity, Neuromuscular re-education, Balance training, Gait training, Patient/Family education, Self Care, and Manual therapy  PLAN FOR NEXT SESSION: Postural education,Gait, progress strength, balance, function, gait with quad cane at pt request.Cervical treatment as required and approved   Hermenia Bers,  PTA 01/02/2024, 11:13 AM

## 2024-01-03 ENCOUNTER — Telehealth: Payer: Self-pay | Admitting: *Deleted

## 2024-01-03 LAB — MULTIPLE MYELOMA PANEL, SERUM
Albumin SerPl Elph-Mcnc: 3.1 g/dL (ref 2.9–4.4)
Albumin/Glob SerPl: 1.2 (ref 0.7–1.7)
Alpha 1: 0.3 g/dL (ref 0.0–0.4)
Alpha2 Glob SerPl Elph-Mcnc: 0.7 g/dL (ref 0.4–1.0)
B-Globulin SerPl Elph-Mcnc: 0.8 g/dL (ref 0.7–1.3)
Gamma Glob SerPl Elph-Mcnc: 0.8 g/dL (ref 0.4–1.8)
Globulin, Total: 2.6 g/dL (ref 2.2–3.9)
IgA: 111 mg/dL (ref 64–422)
IgG (Immunoglobin G), Serum: 770 mg/dL (ref 586–1602)
IgM (Immunoglobulin M), Srm: 41 mg/dL (ref 26–217)
Total Protein ELP: 5.7 g/dL — ABNORMAL LOW (ref 6.0–8.5)

## 2024-01-03 NOTE — Telephone Encounter (Signed)
 TCT patient regarding recent lab results. No answer but was able to leave vm message for her to return this call to [phone] at her convenience.

## 2024-01-03 NOTE — Telephone Encounter (Signed)
-----   Message from Ulysees Barns IV sent at 01/03/2024  8:41 AM EDT ----- Please let Mrs. Countess know her M protein remains undetectable and she continues to be in remission. Her other labs look excellent. We will continue with monthly labs and q 3 month clinic visits. ----- Message ----- From: Interface, Lab In [place] Sent: 01/01/2024  11:17 AM EDT To: Jaci Standard, MD

## 2024-01-04 ENCOUNTER — Ambulatory Visit: Payer: Medicare Other

## 2024-01-04 DIAGNOSIS — M542 Cervicalgia: Secondary | ICD-10-CM

## 2024-01-04 DIAGNOSIS — M6281 Muscle weakness (generalized): Secondary | ICD-10-CM | POA: Diagnosis not present

## 2024-01-04 DIAGNOSIS — R2689 Other abnormalities of gait and mobility: Secondary | ICD-10-CM | POA: Diagnosis not present

## 2024-01-04 DIAGNOSIS — R262 Difficulty in walking, not elsewhere classified: Secondary | ICD-10-CM | POA: Diagnosis not present

## 2024-01-04 DIAGNOSIS — C9000 Multiple myeloma not having achieved remission: Secondary | ICD-10-CM | POA: Diagnosis not present

## 2024-01-04 NOTE — Therapy (Signed)
 OUTPATIENT PHYSICAL THERAPY  LOWER EXTREMITY ONCOLOGY TREATMENT  Patient Name: Zoe [name] MRN: [id number] DOB:1934/01/27, 89 y.o., female Today's Date: 01/04/2024  END OF SESSION:  PT End of Session - 01/04/24 1009     Visit Number 22    Number of Visits 27    Date for PT Re-Evaluation 01/15/24    Authorization Type [employer]    Authorization Time Period 12/21/2023-01/18/2024    Authorization - Visit Number 4    Authorization - Number of Visits 8    PT Start Time 1002    PT Stop Time 1053    PT Time Calculation (min) 51 min    Equipment Utilized During Treatment Gait belt    Activity Tolerance Patient tolerated treatment well;Patient limited by fatigue    Behavior During Therapy WFL for tasks assessed/performed                 Past Medical History:  Diagnosis Date   Arthritis    some - per patient   Breast cancer (HCC)    breast cancer / left    Cataract    bilat    GERD (gastroesophageal reflux disease)    History of kidney stones    Hyperlipidemia    Hypertension    Hypothyroidism    Macular degeneration    Left   S/P TAVR (transcatheter aortic valve replacement) 09/03/2018   23 mm Edwards Sapien 3 transcatheter heart valve placed via percutaneous right transfemoral approach    Severe aortic stenosis    Stress incontinence    Thyroid disease    Tinnitus    Past Surgical History:  Procedure Laterality Date   ABDOMINAL HYSTERECTOMY  1970's   BACK SURGERY     BREAST LUMPECTOMY  12/1998   lumpectomy   CARDIAC CATHETERIZATION     CARDIOVERSION N/A 04/18/2023   Procedure: CARDIOVERSION;  Surgeon: Tessa Lerner, DO;  Location: [facility] INVASIVE CV LAB;  Service: Cardiovascular;  Laterality: N/A;   CARDIOVERSION N/A 04/20/2023   Procedure: CARDIOVERSION;  Surgeon: Yates Decamp, MD;  Location: [facility] INVASIVE CV LAB;  Service: Cardiovascular;  Laterality: N/A;   EYE SURGERY     cataract surgery bilat    INTRAOPERATIVE TRANSTHORACIC ECHOCARDIOGRAM N/A 09/03/2018    Procedure: INTRAOPERATIVE TRANSTHORACIC ECHOCARDIOGRAM;  Surgeon: Kathleene Hazel, MD;  Location: [facility] OR;  Service: Open Heart Surgery;  Laterality: N/A;   KYPHOPLASTY N/A 09/07/2022   Procedure: THORACIC EIGHT KYPHOPLASTY;  Surgeon: Venita Lick, MD;  Location: [facility] OR;  Service: Orthopedics;  Laterality: N/A;  1 hr Local with [facility] 3 C-Bed   LITHOTRIPSY     Right total knee     2018 Dr. Charlann Boxer   RIGHT/LEFT HEART CATH AND CORONARY ANGIOGRAPHY N/A 08/06/2018   Procedure: RIGHT/LEFT HEART CATH AND CORONARY ANGIOGRAPHY;  Surgeon: Yates Decamp, MD;  Location: [facility] INVASIVE CV LAB;  Service: Cardiovascular;  Laterality: N/A;   THYROIDECTOMY, PARTIAL  1975   TONSILLECTOMY     as a child - patient not sure of exact date   TOTAL KNEE ARTHROPLASTY Left 03/13/2016   Procedure: TOTAL KNEE ARTHROPLASTY;  Surgeon: Durene Romans, MD;  Location: [facility] ORS;  Service: Orthopedics;  Laterality: Left;   TOTAL KNEE ARTHROPLASTY Right 06/18/2017   Procedure: RIGHT TOTAL KNEE ARTHROPLASTY;  Surgeon: Durene Romans, MD;  Location: [facility] ORS;  Service: Orthopedics;  Laterality: Right;   TRANSCATHETER AORTIC VALVE REPLACEMENT, TRANSFEMORAL N/A 09/03/2018   Procedure: TRANSCATHETER AORTIC VALVE REPLACEMENT, TRANSFEMORAL;  Surgeon: Kathleene Hazel, MD;  Location: [facility]  OR;  Service: Open Heart Surgery;  Laterality: N/A;   Patient Active Problem List   Diagnosis Date Noted   Chronic anticoagulation 10/02/2023   Vasospastic angina (HCC) 10/01/2023   History of coronary vasospasm 10/01/2023   NSTEMI (non-ST elevated myocardial infarction) (HCC) 10/01/2023   Hypertensive urgency 07/16/2023   Urinary tract infection due to Klebsiella species 07/16/2023   Chronic heart failure with preserved ejection fraction (HFpEF) (HCC) 07/16/2023   Hypokalemia 07/16/2023   Hypocalcemia 07/16/2023   Atypical atrial flutter (HCC) 04/19/2023   Acute on chronic diastolic heart failure (HCC) 04/19/2023   Paroxysmal atrial  fibrillation with rapid ventricular response (HCC) 04/18/2023   History of stroke 04/16/2023   Fall at home, initial encounter 04/16/2023   Atrial fib/flutter, transient (HCC) 04/16/2023   Paroxysmal atrial fibrillation (HCC) 04/15/2023   Elevated troponin 04/15/2023   Chest pain 04/13/2023   Stroke due to embolism (HCC) 03/20/2023   Chest pain, rule out acute myocardial infarction 02/17/2023   Chemotherapy-induced neuropathy (HCC) 11/28/2022   Pain due to onychomycosis of toenails of both feet 11/08/2022   Multiple myeloma (HCC) 07/24/2022   Chronic diastolic CHF (congestive heart failure) (HCC) 05/16/2022   Symptomatic anemia 05/16/2022   Hyponatremia 04/02/2022   Microcytic anemia 04/02/2022   S/P TAVR (transcatheter aortic valve replacement) 09/03/2018   Hypothyroidism    Hypertension    Hyperlipidemia    GERD (gastroesophageal reflux disease)    S/P right TKA 06/18/2017   Class 1 obesity 03/15/2016   S/P left TKA 03/13/2016   History of breast cancer, DCIS, lumpectomy March 2000 12/13/2011    PCP: Dr. Irena Reichmann  REFERRING PROVIDER: Dr. Jeanie Sewer, IV  REFERRING DIAG: Multiple Myeloma  THERAPY DIAG:  Multiple myeloma without remission (HCC)  Muscle weakness (generalized)  Difficulty walking  Balance problem  Neck pain  ONSET DATE: 07/2022  Rationale for Evaluation and Treatment: Rehabilitation                                                                                                                                                                                           SUBJECTIVE STATEMENT:  I haven't improved as much as I would like to, but I am not doing as much as I should at home.  WJ:XBJY 11/08/23 :pt was last seen in PT on 09/27/2023. Following that she was seen in the ED on 09/30/2022 for  Afib which returned to sinus rhythm with vagal maneuvers and later  Non ST elevated myocardial infarction. She has chronic diastolic CHF, and had a TAVR on  09/03/2018. She has been given approval by cardiology to return to PT with script  in chart but will do re-eval with prior ordering physician.I still get dizzy especially in the am before the medications. There is some pain in the anterior thighs with movement and lying down. It seems to be continuous;it doesn't get better or worse. I can get up better from a solid chair than a soft chair. I still feel like my balance is off.   eval I need to get my balance better so I can move without fear of falling. Difficult getting up and down from chairs, Some fatigue with walking short distances;ie car into clinic. Reports some foot numbness if she is cold but feels good with socks on. Her grandson lives with her, but she has a home health aid in the day time and the night time for safety PERTINENT HISTORY:  Pt was diagnosed with  IgGMultiple Myeloma in 07/2022 after a bone Marrow biopsy showed 60% plasma cells and anemia. No lytic lesions. She was started on chemotherapy. She had surgery for T 8 compression fracture kyphoplasty on 09/07/2022. She was hospitalized 04/15/2023 for Atrial Fibrillation with cardioversion attempted on 04/18/2023. She had some PT at that time. She has a home health aide that drives her to appts and helps with keeping her house clean And someone at night to help her get to the bathroom  PMH significant for A-fib on Eliquis and amiodarone, hypothyroidism, HTN, breast cancer, multiple myeloma,aortic stenosis status post TAVR, hx of TIA, Macular degeneration, Head aches she thinks related to her eyes PAIN:  PAIN:  Are you having pain? Yes NPRS scale: did not rate Pain orientation: Bilateral eyes PAIN TYPE: burning Pain description: intermittent Aggravating factors:chemo shot   Relieving factors: rest.   PRECAUTIONS: PMH significant of A-fib on Eliquis and amiodarone, hypothyroidism, HTN, Left breast cancer, multiple myeloma,aortic stenosis status post TAVR, hx of TIA, T8 compression  fx with kyphoplasty  RED FLAGS: Bowel or bladder incontinence: Yes:   and Compression fracture: Yes: had a kyphoplasty    WEIGHT BEARING RESTRICTIONS: No  FALLS:  Has patient fallen in last 6 months? Yes. Number of falls 1, fell in community garden: no injury , 09/14/2023 Larey Seat in home today  LIVING ENVIRONMENT: Lives with: Lucila Maine lives with her presently Lives in: House/apartment Stairs: No;  Has following equipment at home: Single point cane, Environmental consultant - 4 wheeled, and shower chair  OCCUPATION: Retired  LEISURE: read,visit with friends  PRIOR LEVEL OF FUNCTION: Independent with basic ADLs, Independent with household mobility with device, and Needs assistance with homemaking  PATIENT GOALS: Be able to walk more securely, not be dependent on a walker/rollator   OBJECTIVE:  COGNITION: Overall cognitive status: deficits  PALPATION:   OBSERVATIONS / OTHER ASSESSMENTS: Ambulates with rollator, flexed posture/increased kyphosis  SENSATION: Light touch: deficits   POSTURE: forward head, rounded shoulders, increased thoracic kyphosis  CERVICAL AROM: Flexion WNL, NP,right ROT Dec. 50% pain on left, Left Rot dec 60% pain on left,Right SB Dec 60% pain/tight, left SB dec 90% pain Tender B UT, posterior cervicals suboccipitals greatest on the left  LOWER EXTREMITY STRENGTH:   Right RotMMT Right eval RIGHT 09/14/2023  Hip flexion 4- 4+  Hip extension Able to bridge Can bridge  Hip abduction 4- 4+  Hip adduction 4 -seated 4+  Hip internal rotation 3- 3-  Hip external rotation 3+ 3  Knee flexion 4+ 5  Knee extension 4+ 4+ pain  Ankle dorsiflexion 5 5  Ankle plantarflexion    Ankle inversion    Ankle eversion  Great toe extension     (Blank rows = not tested)  MMT LEFT eval LEFT 09/14/2023  Hip flexion 4 4, knee pain  Hip extension Able to bridge Can bridge  Hip abduction 4- 4  Hip adduction 4- 4+  Hip internal rotation 3+ 4-  Hip external rotation 3 3-  Knee  flexion 4+ 5  Knee extension 4 4+ pain  Ankle dorsiflexion 5 5  Ankle plantarflexion    Ankle inversion    Ankle eversion    Great toe extension      (Blank rows = not tested)  LYMPHEDEMA ASSESSMENTS:   SURGERY TYPE/DATE: Left lumpectomy 2000  NUMBER OF LYMPH NODES REMOVED: Thinks so, not sure how many  CHEMOTHERAPY: Yes for Multiple Myeloma, No for Breast Cancer  RADIATION:No for Breast Cancer  HORMONE TREATMENT: Yes for Breast Cancer  INFECTIONS: NO  FUNCTIONAL TESTS:  30 seconds sit to stand test: EVAL: 5 repetitions with use of Ue's 09/14/2023 ; 5 repetitions with minimal use of Ue's, but some knee pain after fall today 11/08/2023: 6 with light use of both hands 12/18/2023  5 repetitions with use of both hands  Timed up and go: 30.4 seconds with rollator and use of hands to rise from chair 09/14/2023  2 trials 23 seconds, 20 seconds 11/08/2023:    23.01 22.27 seconds with rollator and using hands to rise 12/18/2023 Avg of 2 trials 24.2 seconds  Four position balance test:  able to maintain DLS feet together x 10 sec, and half tandem with left foot forward x 10 sec. , unable to hold half tandem with right foot forward for greater than 3 seconds, unable to maintain tandem stance bilaterally for more than a few seconds. Did not test SLS 11/07/2023 Half tandem right forward 23 sec, left forward 22 GAIT: Distance walked: Nustep to ramp in front of building Assistive device utilized: Rollator Level of assistance: Modified independence Comments:flexed posture, increased kyphosis    Outcome measure:  TODAY'S TREATMENT:                                                                                                                                          DATE:   01/04/2024 Nu Step seat 8, UE 9, Lev 4, x 7 min, 402 steps Heel raises x 10 with HH, x 10 no HH Marching x 10 with HH, 2 x 10 no HH to improve household function Mini lunges x 10 B with HH Mini squats x 10 with  Hh,seated rest Ambulated with rollator 394 ft without rest with emphasis on posture, seated rest Ambulated with SPC 2 times around cancer gym with CGA. Slight right drift   01/02/24: O2 99%, HR 64 BPM Therapeutic Exercises and Neuro Re Ed Nu Step seat 8, UE 9, Lev 4, x 5 min In // bars: Alt marching with +2 HHA 2 x 15 Heel raises x 20  with fingertip support Tandem stance x 5 ea side to failure, then same with narrow BOS Seated rest break Gait with SPC and gait belt for CGA x 175 feet Seated rest break Sit to stand with purple ax in chair 2 x 5 no hands Using // bars for DF stretch x 2 reps, 20 sec holds each returning therapist demo Self Care During seated rest breaks discussed ways pt could incorporate HEP into her day and short walks and how this will help improve her overall strength and endurance.   12/27/2023 O2 98%, HR 71 BPM Nu Step seat 8, UE 9, Lev 4, x 5 min Heel raises x 10 with HH, x 10 no HH Marching x 10 with HH, 2 x 10 no HH Tandem stance x 5 ea side to failure Standing hip abd x 12 ea side holding bar seated rest Ambulated 2 x 1050 ft with rollator and CGA DF stretch wit strp 3 x 25 sec Sit to stand with purple ax in chair;2 x 5 no hands Ambulated in cancer gym 1 lap with SPC on right and CGA of PT for endurance and with emphasis on erect posture and cane placement Ax beam in bars holding bars;2 lengths forward and 2 lengths sidestepping with CGA  12/25/2023 Nu Step seat 8, UE 9, Lev 4, 367 steps Heel raises x 10 with HH, x 10 no HH Marching x 10 with HH, x 10 no HH Sidestepping 4 lengths in bar emphasis on erect posture Semi tandem stance B with intermittent hand hold, tandem stance with intermittent hand hold to failure Calf stretch with strap seated x 2 ea, standing calf stretch in bars with lunge x 2 ea Ambulated 2 x 150 ft with rollator seated rest between  Ambulated around cancer gym x 1 with SPC; pt felt slightly wobbly so pt rested before  leaving  12/20/2023 O2 97%, HR 61 Nu Step seat 8, UE 9, Lev 4, 348 steps Heel raises x 10 with HH, x 10 no HH Marching x 10 with HH, x 10 no HH Sidestepping 4 lengths in bar emphasis on erect posture Semi tandem stance B with intermittent hand hold, tandem stance with intermittent hand hold Ambulated to ortho gym with cane in right UE and stopped to rest in chair. Good balance, erect posture. Emphasis on cane placement Ambulated back ot cancer gym; tried cane in left hand but returned back to right hand half way down as pt was losing her balance to the left. Seated brief rest Sit to stand from chair with ax 2 x 5 no hands Step ups on ax x 10 ea side with light hand touch Standing on foam ax; feet apart, feet together eyes open; notable sway but worse with feet together x 30 sec each 12/18/2023 O2 98%, HR 66 30 sec sit to stand: 5 reps TUG; 2 trial avg 24.2 sec Assessed cervical ROM for recert Messaged MD to add cervical referral Nu step x 4 min seat 8, UE 9 Lev 4 x 4  min stopping due to pt fatigue Educated in importance of erect postrue for decreasing neck pain. Educated in towel roll for pillow, and lumbar roll for sitting to improve posture. VC to maintain good posture with head and neck throughout treatment STM to bilateral cervical region with cocoa butter with pt sitting in chair. Felt good but no overall improvement.   11/20/2023 02 99%,  HR 55 Pt arrived with her SPC and no walker today. Caregiver walked in  with her. Nu Step seat 8, UE 9, L5 x 6 min, 361 steps Heel raises x 10 with HH, x 10 no HH Marching x 10 with HH, x 10 no HH Sidestepping 4 lengths in bar Incline stretch x 3 Half tandem x 1 B, tandem stance x 1 B, to failure Ambulated with SPC 116 ft seated rest, 112 ft seated rest with emphasis on cane placement  and step length , CGA of PT Sit to stand with purple cushion x 10 with CGA Ambulated 1 lap around cancer gym with SPC, CGA  11/08/2023 O2 99,  HR 57,  BP  135/59 Performed 30 sec sit to stand, TUG and half tandem stance for recert Practiced half tandem stance;pt able to hold bilateral feet forward in half tandem for greater than 20 sec. On first attempt Ambulated 52 ft, rest,and then 90 feet, then seated rest, then 97 feet with SPC in right UE and CGA emphasis on coordinating gait and cane and looking up.seated rest.   09/27/2023  Nu Step seat 8, UE 9 Lev 3 x 8 min to warm up and loosen knees and muscles In parallel bars using left hand only practice ambulating 6 lengths with single arm support Instructed in proper cane placement and gait pattern with step through gait using SPC;pt wearing gait belt and with CGA of PT; Practiced in parallel bars x 8 lengths and then as pt improved went to gym floor.  Seated rest Practiced on straight aways x 3 and then walked half way to ortho gym with CGA of PT and emphasis on erect posture and proper step through. Seated rest Repeated walking around gym and straight aways. Pt noted to limp slightly on left LE as she increased her distance. She was not aware of this. Also appeared to be hip abd weakness. Fully explained to pt my concerns about her walking with a cane;intermittent dizziness and fall risk. Advised her to contact pharmacist to see if any of her meds could be causing dizziness. Ishe tells me she has used the rollator atleast a year.  09/14/2023 02 99, HR 68 BP 129/68 Sit to stand :30 seconds : 5 reps with some knee pain TUG: 2 trials  23 sec, 20 sec Half tandem stance able to maintain Bilaterally x 15 sec Full tandem done bilaterally; limited to no greater than 3 sec for 2-3 trials each Tested lower extremity strength bilaterally and reviewed goals with pt.  09/11/23: Therapeutic Exercises Nu Step seat 8, UE 9 Lev 3 x 8 min, 588 steps Vital signs taken after NuStep at pts request, see Vital Signs In // bars with 1# added to each ankle for following: Heel raises x 10 with HH, x 10 without  HH High march x 10 B with fingertip assist Low march x 10 with fingertips, SBA SLS on blue oval for bil hip flex and abd x 10 each (still with 1# on ankles) Seated rest after // bars and removed ankle weights Sit to stand with purple cushion in chair 2 x 5, SBA to CGA (1 posterior LOB to chair) In // bars for multiple trials of modified tandem stance bilaterally in varying positions for 10 sec at a time with fingertip support throughout, was able to get to practicing full tandem today which pt has not felt confident enough to try before. Pt ambulated from cancer gym to ortho gym and returned without rest and with rollator; SBA   09/04/23: Therapeutic Exercises Nu Step seat 8, UE 9  Lev 3 x 8 min, 605 steps In // bars: Heel raises x 10 with HH, x 10 without HH High march x 10 B with fingertip assist Low march x 10 no HH, SBA Vector reaches with handhold x 5 each returning therapist demo Sit to stand with purple cushion in chair 2 x 5, CGA In // bars for multiple trials of modified tandem stance bilaterally in varying positions for 10 sec at a time working towards no HH On mini tramp for following: High march 2 x 5 each with +2 HH, 3 way weight shifting x 30 sec each +2 HH, then 1 trial partial tandem stance each x 30 sec with fingertips Pt ambulated from cancer gym to ortho gym and returned without rest and with rollator; SBA   08/31/23:  SpO2 98%, HR 61 bpm Therapeutic Exercises Nu Step seat 8, UE 9 Lev 3 x 8 min, 546 steps In // bars:Heel raises x 10 with HH, x 10 without HH High march x 10 B with HH Low march x 10 no HH, CGA UE reaches with SBA-CGA x 5 each side encouraging reaching until it becomes challenging Sit to stand with purple cushion in chair 2 x 5, CGA 6 in step ups 2 x 5 bilaterally with HH Foot placement on 6 in step forward 2 x 5 ea with second set no HH and sideways bilaterally x 5 ea, no HH; min VC's for erect posture throughout Pt ambulated from cancer gym to ortho  gym and returned without rest and with rollator; SBA       PATIENT EDUCATION:    Gastroc stretch with strap or lunge to counter Access Code: [email or web address] URL: [email or web address] Date: 07/12/2023 Prepared by: Alvira Monday  Exercises Gastroc stretches - Supine Bridge  - 1 x daily - 7 x weekly - 1 sets - 10 reps - Seated Long Arc Quad  - 1 x daily - 7 x weekly - 1 sets - 10 reps - Standing Hip Abduction with Counter Support  - 1 x daily - 7 x weekly - 1 sets - 10 reps - Seated March  - 1 x daily - 7 x weekly - 1 sets - 10 reps - Standing March with Counter Support  - 1 x daily - 7 x weekly - 1 sets - 10 reps - Standing Heel Raise  - 1 x daily - 7 x weekly - 1 sets - 10 reps - Seated Hip Adduction Isometrics with Ball  - 1 x daily - 7 x weekly - 1 sets - 10 reps - 5 hold Education details: Discussed POC and treatment interventions including, strength, balance and function Person educated: Patient Education method: Explanation Education comprehension: verbalized understanding  HOME EXERCISE PROGRAM:  Supine Bridge  - 1 x daily - 7 x weekly - 1 sets - 10 reps - Seated Long Arc Quad  - 1 x daily - 7 x weekly - 1 sets - 10 reps - Standing Hip Abduction with Counter Support  - 1 x daily - 7 x weekly - 1 sets - 10 reps - Seated March  - 1 x daily - 7 x weekly - 1 sets - 10 reps - Standing March with Counter Support  - 1 x daily - 7 x weekly - 1 sets - 10 reps - Standing Heel Raise  - 1 x daily - 7 x weekly - 1 sets - 10 reps - Seated Hip Adduction Isometrics with Ball  - 1 x daily -  7 x weekly - 1 sets - 10 reps - 5 hold  ASSESSMENT:  CLINICAL IMPRESSION: Noted improved endurance today with pt able to walk 394 feet with rollator without rest break. Legs very fatigued after doing lunges and mini squats  OBJECTIVE IMPAIRMENTS: decreased activity tolerance, decreased balance, decreased cognition, decreased endurance, decreased knowledge of condition, difficulty walking,  decreased strength, impaired sensation, impaired vision/preception, postural dysfunction, and pain.   ACTIVITY LIMITATIONS: carrying, lifting, standing, squatting, bed mobility, dressing, and locomotion level  PARTICIPATION LIMITATIONS: meal prep, cleaning, laundry, driving, shopping, and community activity  PERSONAL FACTORS: Age and 3+ comorbidities: multiple myeloma,Macular Degeneration , Afib are also affecting patient's functional outcome.   REHAB POTENTIAL: Good  CLINICAL DECISION MAKING: Evolving/moderate complexity  EVALUATION COMPLEXITY: Low   GOALS: Goals reviewed with patient? Yes  SHORT TERM GOALS: Target date: 07/30/2023  Pt will be independent in a HEP for LE/core strength Baseline: Goal status: MET 08/14/2023 2.  Pt will be able to maintain half tandem stance bilaterally x 10 sec Baseline:  Goal status:MET 08/14/2023 3.  Pt will perform 6-7 sit to stands in 30 seconds to decrease fall risk Baseline:  Goal status:MET 10/24, 6 reps with hands 4.  Pt will perform TUG in 26 seconds to decrease fall risk Baseline:   Goal status: MET 10/24   LONG TERM GOALS: Target date: 10/25/2022 Pt will be able to maintain tandem stance for 10 seconds B to demonstrate improved balance Baseline:  Goal status:  In progress  2.  Pt will be able to perform 9 sit to stands in 30 seconds for average score to decrease fall risk Baseline:  Goal status: In Progress, 6 today with use of hands 3.  Pt will be able to perform TUG with rollator in 24 seconds or less for improved gait speed and decreased fall risk Baseline:  Goal status:MET  09/14/2023  4.  Pt will be independent in more advanced HEP Baseline:  Goal status: In Progress  5. Pt will be able to safely ambulate household distances with quad cane  GOAL STATUS; IN PROGRESS  PLAN:  PT FREQUENCY: 2x/week  PT DURATION: 4 weeks  PLANNED INTERVENTIONS: Therapeutic exercises, Therapeutic activity, Neuromuscular re-education,  Balance training, Gait training, Patient/Family education, Self Care, and Manual therapy  PLAN FOR NEXT SESSION: Postural education,Gait, progress strength, balance, function, gait with quad cane at pt request.Cervical treatment as required and approved   Waynette Buttery, PT 01/04/2024, 11:13 AM

## 2024-01-07 ENCOUNTER — Telehealth: Payer: Self-pay | Admitting: *Deleted

## 2024-01-07 NOTE — Telephone Encounter (Signed)
 TCT patient regarding her recent lab results. Spoke to her. Advised that her M protein remains undetectable and she continues to be in remission. Her other labs look excellent. We will continue with monthly labs and q 3 month clinic visits. Pt very pleased with her results and knows that she gets monthly labs but will now see Dr. Leonides Schanz every 3 months.

## 2024-01-07 NOTE — Telephone Encounter (Signed)
-----   Message from Ulysees Barns IV sent at 01/03/2024  8:41 AM EDT ----- Please let Mrs. Countess know her M protein remains undetectable and she continues to be in remission. Her other labs look excellent. We will continue with monthly labs and q 3 month clinic visits. ----- Message ----- From: Interface, Lab In [place] Sent: 01/01/2024  11:17 AM EDT To: Jaci Standard, MD

## 2024-01-08 ENCOUNTER — Other Ambulatory Visit: Payer: Self-pay

## 2024-01-08 ENCOUNTER — Inpatient Hospital Stay (HOSPITAL_COMMUNITY)
Admission: EM | Admit: 2024-01-08 | Discharge: 2024-01-14 | DRG: 242 | Disposition: A | Discharge status: Skilled Nursing Facility | Attending: Internal Medicine | Admitting: Internal Medicine

## 2024-01-08 ENCOUNTER — Emergency Department (HOSPITAL_COMMUNITY)

## 2024-01-08 ENCOUNTER — Ambulatory Visit: Payer: Medicare Other

## 2024-01-08 ENCOUNTER — Encounter (HOSPITAL_COMMUNITY): Payer: Self-pay | Admitting: *Deleted

## 2024-01-08 DIAGNOSIS — R519 Headache, unspecified: Secondary | ICD-10-CM | POA: Diagnosis not present

## 2024-01-08 DIAGNOSIS — Z79899 Other long term (current) drug therapy: Secondary | ICD-10-CM

## 2024-01-08 DIAGNOSIS — I5032 Chronic diastolic (congestive) heart failure: Secondary | ICD-10-CM | POA: Diagnosis present

## 2024-01-08 DIAGNOSIS — Z8673 Personal history of transient ischemic attack (TIA), and cerebral infarction without residual deficits: Secondary | ICD-10-CM

## 2024-01-08 DIAGNOSIS — I442 Atrioventricular block, complete: Principal | ICD-10-CM

## 2024-01-08 DIAGNOSIS — Z7901 Long term (current) use of anticoagulants: Secondary | ICD-10-CM | POA: Diagnosis not present

## 2024-01-08 DIAGNOSIS — R6889 Other general symptoms and signs: Secondary | ICD-10-CM | POA: Diagnosis not present

## 2024-01-08 DIAGNOSIS — I130 Hypertensive heart and chronic kidney disease with heart failure and stage 1 through stage 4 chronic kidney disease, or unspecified chronic kidney disease: Secondary | ICD-10-CM | POA: Diagnosis present

## 2024-01-08 DIAGNOSIS — Z880 Allergy status to penicillin: Secondary | ICD-10-CM | POA: Diagnosis not present

## 2024-01-08 DIAGNOSIS — Z90710 Acquired absence of both cervix and uterus: Secondary | ICD-10-CM

## 2024-01-08 DIAGNOSIS — E039 Hypothyroidism, unspecified: Secondary | ICD-10-CM | POA: Diagnosis not present

## 2024-01-08 DIAGNOSIS — R0689 Other abnormalities of breathing: Secondary | ICD-10-CM | POA: Diagnosis present

## 2024-01-08 DIAGNOSIS — Z8249 Family history of ischemic heart disease and other diseases of the circulatory system: Secondary | ICD-10-CM

## 2024-01-08 DIAGNOSIS — Z952 Presence of prosthetic heart valve: Secondary | ICD-10-CM

## 2024-01-08 DIAGNOSIS — Z043 Encounter for examination and observation following other accident: Secondary | ICD-10-CM | POA: Diagnosis not present

## 2024-01-08 DIAGNOSIS — I161 Hypertensive emergency: Secondary | ICD-10-CM | POA: Diagnosis present

## 2024-01-08 DIAGNOSIS — W19XXXA Unspecified fall, initial encounter: Principal | ICD-10-CM

## 2024-01-08 DIAGNOSIS — R55 Syncope and collapse: Secondary | ICD-10-CM

## 2024-01-08 DIAGNOSIS — F419 Anxiety disorder, unspecified: Secondary | ICD-10-CM | POA: Diagnosis present

## 2024-01-08 DIAGNOSIS — I10 Essential (primary) hypertension: Secondary | ICD-10-CM | POA: Diagnosis present

## 2024-01-08 DIAGNOSIS — Z823 Family history of stroke: Secondary | ICD-10-CM

## 2024-01-08 DIAGNOSIS — S0990XA Unspecified injury of head, initial encounter: Secondary | ICD-10-CM | POA: Diagnosis not present

## 2024-01-08 DIAGNOSIS — N1832 Chronic kidney disease, stage 3b: Secondary | ICD-10-CM | POA: Insufficient documentation

## 2024-01-08 DIAGNOSIS — E876 Hypokalemia: Secondary | ICD-10-CM | POA: Diagnosis present

## 2024-01-08 DIAGNOSIS — R079 Chest pain, unspecified: Secondary | ICD-10-CM | POA: Diagnosis not present

## 2024-01-08 DIAGNOSIS — E785 Hyperlipidemia, unspecified: Secondary | ICD-10-CM | POA: Diagnosis not present

## 2024-01-08 DIAGNOSIS — I272 Pulmonary hypertension, unspecified: Secondary | ICD-10-CM | POA: Diagnosis present

## 2024-01-08 DIAGNOSIS — I214 Non-ST elevation (NSTEMI) myocardial infarction: Secondary | ICD-10-CM | POA: Diagnosis present

## 2024-01-08 DIAGNOSIS — Z8679 Personal history of other diseases of the circulatory system: Secondary | ICD-10-CM

## 2024-01-08 DIAGNOSIS — Y92008 Other place in unspecified non-institutional (private) residence as the place of occurrence of the external cause: Secondary | ICD-10-CM

## 2024-01-08 DIAGNOSIS — C9000 Multiple myeloma not having achieved remission: Secondary | ICD-10-CM | POA: Diagnosis present

## 2024-01-08 DIAGNOSIS — I352 Nonrheumatic aortic (valve) stenosis with insufficiency: Secondary | ICD-10-CM | POA: Diagnosis present

## 2024-01-08 DIAGNOSIS — K219 Gastro-esophageal reflux disease without esophagitis: Secondary | ICD-10-CM | POA: Diagnosis present

## 2024-01-08 DIAGNOSIS — I201 Angina pectoris with documented spasm: Secondary | ICD-10-CM | POA: Diagnosis present

## 2024-01-08 DIAGNOSIS — R0902 Hypoxemia: Secondary | ICD-10-CM | POA: Diagnosis not present

## 2024-01-08 DIAGNOSIS — Z953 Presence of xenogenic heart valve: Secondary | ICD-10-CM

## 2024-01-08 DIAGNOSIS — Z833 Family history of diabetes mellitus: Secondary | ICD-10-CM

## 2024-01-08 DIAGNOSIS — R404 Transient alteration of awareness: Secondary | ICD-10-CM | POA: Diagnosis not present

## 2024-01-08 DIAGNOSIS — Z79890 Hormone replacement therapy (postmenopausal): Secondary | ICD-10-CM

## 2024-01-08 DIAGNOSIS — Z881 Allergy status to other antibiotic agents status: Secondary | ICD-10-CM | POA: Diagnosis not present

## 2024-01-08 DIAGNOSIS — I351 Nonrheumatic aortic (valve) insufficiency: Secondary | ICD-10-CM | POA: Diagnosis not present

## 2024-01-08 DIAGNOSIS — I495 Sick sinus syndrome: Secondary | ICD-10-CM | POA: Diagnosis present

## 2024-01-08 DIAGNOSIS — I480 Paroxysmal atrial fibrillation: Secondary | ICD-10-CM | POA: Diagnosis not present

## 2024-01-08 DIAGNOSIS — I459 Conduction disorder, unspecified: Secondary | ICD-10-CM | POA: Diagnosis not present

## 2024-01-08 DIAGNOSIS — Z96653 Presence of artificial knee joint, bilateral: Secondary | ICD-10-CM | POA: Diagnosis not present

## 2024-01-08 DIAGNOSIS — M160 Bilateral primary osteoarthritis of hip: Secondary | ICD-10-CM | POA: Diagnosis not present

## 2024-01-08 DIAGNOSIS — N183 Chronic kidney disease, stage 3 unspecified: Secondary | ICD-10-CM | POA: Insufficient documentation

## 2024-01-08 DIAGNOSIS — Z86000 Personal history of in-situ neoplasm of breast: Secondary | ICD-10-CM

## 2024-01-08 DIAGNOSIS — R7989 Other specified abnormal findings of blood chemistry: Secondary | ICD-10-CM | POA: Diagnosis present

## 2024-01-08 DIAGNOSIS — Z882 Allergy status to sulfonamides status: Secondary | ICD-10-CM | POA: Diagnosis not present

## 2024-01-08 DIAGNOSIS — Z853 Personal history of malignant neoplasm of breast: Secondary | ICD-10-CM

## 2024-01-08 DIAGNOSIS — I4891 Unspecified atrial fibrillation: Secondary | ICD-10-CM | POA: Diagnosis not present

## 2024-01-08 DIAGNOSIS — Z743 Need for continuous supervision: Secondary | ICD-10-CM | POA: Diagnosis not present

## 2024-01-08 DIAGNOSIS — M47812 Spondylosis without myelopathy or radiculopathy, cervical region: Secondary | ICD-10-CM | POA: Diagnosis not present

## 2024-01-08 DIAGNOSIS — N182 Chronic kidney disease, stage 2 (mild): Secondary | ICD-10-CM | POA: Diagnosis present

## 2024-01-08 DIAGNOSIS — W010XXA Fall on same level from slipping, tripping and stumbling without subsequent striking against object, initial encounter: Secondary | ICD-10-CM | POA: Diagnosis present

## 2024-01-08 DIAGNOSIS — I2510 Atherosclerotic heart disease of native coronary artery without angina pectoris: Secondary | ICD-10-CM | POA: Diagnosis not present

## 2024-01-08 LAB — URINALYSIS, ROUTINE W REFLEX MICROSCOPIC
Bilirubin Urine: NEGATIVE
Glucose, UA: NEGATIVE mg/dL
Hgb urine dipstick: NEGATIVE
Ketones, ur: NEGATIVE mg/dL
Leukocytes,Ua: NEGATIVE
Nitrite: NEGATIVE
Protein, ur: NEGATIVE mg/dL
Specific Gravity, Urine: 1.005 (ref 1.005–1.030)
pH: 7 (ref 5.0–8.0)

## 2024-01-08 LAB — COMPREHENSIVE METABOLIC PANEL
ALT: 15 U/L (ref 0–44)
AST: 22 U/L (ref 15–41)
Albumin: 3.2 g/dL — ABNORMAL LOW (ref 3.5–5.0)
Alkaline Phosphatase: 70 U/L (ref 38–126)
Anion gap: 11 (ref 5–15)
BUN: 19 mg/dL (ref 8–23)
CO2: 28 mmol/L (ref 22–32)
Calcium: 8.8 mg/dL — ABNORMAL LOW (ref 8.9–10.3)
Chloride: 99 mmol/L (ref 98–111)
Creatinine, Ser: 1.02 mg/dL — ABNORMAL HIGH (ref 0.44–1.00)
GFR, Estimated: 53 mL/min — ABNORMAL LOW (ref >60)
Glucose, Bld: 130 mg/dL — ABNORMAL HIGH (ref 70–99)
Potassium: 3.8 mmol/L (ref 3.5–5.1)
Sodium: 138 mmol/L (ref 135–145)
Total Bilirubin: 0.7 mg/dL (ref 0.0–1.2)
Total Protein: 5.8 g/dL — ABNORMAL LOW (ref 6.5–8.1)

## 2024-01-08 LAB — I-STAT CG4 LACTIC ACID, ED
Lactic Acid, Venous: 2.2 mmol/L (ref 0.5–1.9)
Lactic Acid, Venous: 1.3 mmol/L (ref 0.5–1.9)

## 2024-01-08 LAB — CBC
HCT: 38.8 % (ref 36.0–46.0)
Hemoglobin: 12.7 g/dL (ref 12.0–15.0)
MCH: 29.3 pg (ref 26.0–34.0)
MCHC: 32.7 g/dL (ref 30.0–36.0)
MCV: 89.4 fL (ref 80.0–100.0)
Platelets: 102 10*3/uL — ABNORMAL LOW (ref 150–400)
RBC: 4.34 MIL/uL (ref 3.87–5.11)
RDW: 15.4 % (ref 11.5–15.5)
WBC: 7.1 10*3/uL (ref 4.0–10.5)
nRBC: 0 % (ref 0.0–0.2)

## 2024-01-08 LAB — TROPONIN I (HIGH SENSITIVITY)
Troponin I (High Sensitivity): 108 ng/L (ref <18)
Troponin I (High Sensitivity): 102 ng/L (ref <18)

## 2024-01-08 LAB — I-STAT CHEM 8, ED
BUN: 20 mg/dL (ref 8–23)
Calcium, Ion: 1.12 mmol/L — ABNORMAL LOW (ref 1.15–1.40)
Chloride: 98 mmol/L (ref 98–111)
Creatinine, Ser: 1 mg/dL (ref 0.44–1.00)
Glucose, Bld: 124 mg/dL — ABNORMAL HIGH (ref 70–99)
HCT: 39 % (ref 36.0–46.0)
Hemoglobin: 13.3 g/dL (ref 12.0–15.0)
Potassium: 3.6 mmol/L (ref 3.5–5.1)
Sodium: 137 mmol/L (ref 135–145)
TCO2: 27 mmol/L (ref 22–32)

## 2024-01-08 LAB — ETHANOL: Alcohol, Ethyl (B): <10 mg/dL (ref <10)

## 2024-01-08 LAB — PROTIME-INR
INR: 1.3 — ABNORMAL HIGH (ref 0.8–1.2)
Prothrombin Time: 16.4 s — ABNORMAL HIGH (ref 11.4–15.2)

## 2024-01-08 LAB — SAMPLE TO BLOOD BANK

## 2024-01-08 LAB — APTT: aPTT: 34 s (ref 24–36)

## 2024-01-08 LAB — HEPARIN LEVEL (UNFRACTIONATED): Heparin Unfractionated: >1.1 [IU]/mL — ABNORMAL HIGH (ref 0.30–0.70)

## 2024-01-08 LAB — D-DIMER, QUANTITATIVE: D-Dimer, Quant: 0.43 ug{FEU}/mL (ref 0.00–0.50)

## 2024-01-08 MED ORDER — SODIUM CHLORIDE 0.9 % IV BOLUS
1000.0000 mL | Freq: Once | INTRAVENOUS | Status: AC
  Administered 2024-01-08: 1000 mL via INTRAVENOUS

## 2024-01-08 MED ORDER — ACETAMINOPHEN 160 MG/5ML PO SOLN
650.0000 mg | Freq: Once | ORAL | Status: DC
  Filled 2024-01-08: qty 20.3

## 2024-01-08 MED ORDER — ASPIRIN 81 MG PO CHEW
324.0000 mg | CHEWABLE_TABLET | Freq: Once | ORAL | Status: AC
  Administered 2024-01-08: 324 mg via ORAL
  Filled 2024-01-08: qty 4

## 2024-01-08 MED ORDER — NITROGLYCERIN 0.4 MG SL SUBL
0.4000 mg | SUBLINGUAL_TABLET | SUBLINGUAL | Status: DC | PRN
  Administered 2024-01-08 – 2024-01-13 (×10): 0.4 mg via SUBLINGUAL
  Filled 2024-01-08 (×9): qty 1

## 2024-01-08 MED ORDER — ACETAMINOPHEN 325 MG PO TABS
ORAL_TABLET | ORAL | Status: AC
  Administered 2024-01-08: 650 mg
  Filled 2024-01-08: qty 2

## 2024-01-08 MED ORDER — HEPARIN (PORCINE) 25000 UT/250ML-% IV SOLN
800.0000 [IU]/h | INTRAVENOUS | Status: DC
  Administered 2024-01-08: 800 [IU]/h via INTRAVENOUS
  Filled 2024-01-08: qty 250

## 2024-01-08 NOTE — ED Notes (Signed)
 CT called, no room available at this time, will call back. Multiple traumas and stroke pts at the same time.

## 2024-01-08 NOTE — ED Notes (Signed)
 Manual BP done, pt to CT, IVF bolus initiated. Alert, NAD, calm, pleasant, polite, no changes.

## 2024-01-08 NOTE — ED Provider Notes (Signed)
  Physical Exam  BP (!) 166/61   Pulse 62   Temp 98.3 F (36.8 C) (Oral)   Resp 18   Ht 5\' 3"  (1.6 m)   Wt 74.8 kg   SpO2 98%   BMI 29.23 kg/m   Physical Exam  Procedures  Procedures  ED Course / MDM   Clinical Course as of 01/08/24 1538  Tue Jan 08, 2024  1537 Patient signed out to Dr. Dalene Seltzer pending CT reads and reassessment for disposition.  [VK]    Clinical Course User Index [VK] Rexford Maus, DO   Medical Decision Making Amount and/or Complexity of Data Reviewed Labs: ordered. Radiology: ordered.  Risk OTC drugs. Decision regarding hospitalization.    89yo female with history of atrial fibrillation on eliquis, AVR, CHF, hypertension, DM who presents with fall.  Felt dizzy, thinks brief LOC, initially felt ok and then had syncopal episode and vomiting while getting her hair done.  Feeling improved now.  On my history reports she has had some chest pain. Has not had a lot of water today.  Chest pain feels like a little pressure. Started this morning after the fall, but did not fall onto it. Now is more pronounced then when checked in.  CP not radiating.  Maybe a little dyspnea but not sure.   CT imaging shows no sign of injuries.  Troponin elevation to 100s. Given chest pressure symptoms and elevation, ordered aspirin and heparin gtt. Does appear to have some elevation in the past. Cardiology consulted. Will admit to hospitalist for syncope and chest pain evaluation.         Alvira Monday, MD 01/09/24 1208

## 2024-01-08 NOTE — Progress Notes (Signed)
 Fall on Thinners. Provided emotional and spiritual support as needed.  Chaplain will follow as needed.  Venida Jarvis, [place], [facility], Pager [phone]

## 2024-01-08 NOTE — H&P (Signed)
 History and Physical    Zoe [name]:[id number] DOB: [birth year] DOA: 01/08/2024  Patient coming from: Home.  Chief Complaint: Loss of consciousness and chest pain.  HPI: Zoe is a 89 y.o. female with history of multiple myeloma, status post TAVR, paroxysmal atrial fibrillation, diastolic CHF, possible mild coronary angio spasm per cardiac cath in 2014, history of breast cancer status post lumpectomy in 2000, history of embolic CVA was brought to the ER after patient had a syncopal episode.  Patient states earlier this morning patient was changing her gown when she suddenly lost balance and fell onto the floor hit her head.  She thinks he may have lost consciousness.  Following the fall home health aide and son helped her to the bed.  Later in the day when she was getting her hair done she suddenly lost consciousness and following waking up she had 2-3 episodes of vomiting.  She also complained of some substernal chest pain.  Patient was brought to the ER.  ED Course: In the ER CT head, C-spine and x-ray of the pelvis not showing anything acute.  Chest x-ray unremarkable EKG shows normal sinus rhythm.  Patient was chest pain-free.  EKG shows normal sinus rhythm.  Troponins 108 102 patient was started on heparin infusion.  Cardiology was consulted.  Patient admitted for further workup of syncope and chest pain.  Review of Systems: As per HPI, rest all negative.   Past Medical History:  Diagnosis Date   Arthritis    some - per patient   Breast cancer (HCC)    breast cancer / left    Cataract    bilat    GERD (gastroesophageal reflux disease)    History of kidney stones    Hyperlipidemia    Hypertension    Hypothyroidism    Macular degeneration    Left   S/P TAVR (transcatheter aortic valve replacement) 09/03/2018   23 mm Edwards Sapien 3 transcatheter heart valve placed via percutaneous right transfemoral approach    Severe aortic stenosis    Stress incontinence     Thyroid disease    Tinnitus     Past Surgical History:  Procedure Laterality Date   ABDOMINAL HYSTERECTOMY  1970's   BACK SURGERY     BREAST LUMPECTOMY  12/1998   lumpectomy   CARDIAC CATHETERIZATION     CARDIOVERSION N/A 04/18/2023   Procedure: CARDIOVERSION;  Surgeon: Tessa Lerner, DO;  Location: [facility] INVASIVE CV LAB;  Service: Cardiovascular;  Laterality: N/A;   CARDIOVERSION N/A 04/20/2023   Procedure: CARDIOVERSION;  Surgeon: Yates Decamp, MD;  Location: [facility] INVASIVE CV LAB;  Service: Cardiovascular;  Laterality: N/A;   EYE SURGERY     cataract surgery bilat    INTRAOPERATIVE TRANSTHORACIC ECHOCARDIOGRAM N/A 09/03/2018   Procedure: INTRAOPERATIVE TRANSTHORACIC ECHOCARDIOGRAM;  Surgeon: Kathleene Hazel, MD;  Location: [facility] OR;  Service: Open Heart Surgery;  Laterality: N/A;   KYPHOPLASTY N/A 09/07/2022   Procedure: THORACIC EIGHT KYPHOPLASTY;  Surgeon: Venita Lick, MD;  Location: [facility] OR;  Service: Orthopedics;  Laterality: N/A;  1 hr Local with [facility] 3 C-Bed   LITHOTRIPSY     Right total knee     2018 Dr. Charlann Boxer   RIGHT/LEFT HEART CATH AND CORONARY ANGIOGRAPHY N/A 08/06/2018   Procedure: RIGHT/LEFT HEART CATH AND CORONARY ANGIOGRAPHY;  Surgeon: Yates Decamp, MD;  Location: [facility] INVASIVE CV LAB;  Service: Cardiovascular;  Laterality: N/A;   THYROIDECTOMY, PARTIAL  1975   TONSILLECTOMY     as  a child - patient not sure of exact date   TOTAL KNEE ARTHROPLASTY Left 03/13/2016   Procedure: TOTAL KNEE ARTHROPLASTY;  Surgeon: Durene Romans, MD;  Location: [facility] ORS;  Service: Orthopedics;  Laterality: Left;   TOTAL KNEE ARTHROPLASTY Right 06/18/2017   Procedure: RIGHT TOTAL KNEE ARTHROPLASTY;  Surgeon: Durene Romans, MD;  Location: [facility] ORS;  Service: Orthopedics;  Laterality: Right;   TRANSCATHETER AORTIC VALVE REPLACEMENT, TRANSFEMORAL N/A 09/03/2018   Procedure: TRANSCATHETER AORTIC VALVE REPLACEMENT, TRANSFEMORAL;  Surgeon: Kathleene Hazel, MD;  Location: [facility] OR;  Service:  Open Heart Surgery;  Laterality: N/A;     reports that she has never smoked. She has never used smokeless tobacco. She reports that she does not drink alcohol and does not use drugs.  Allergies  Allergen Reactions   Quinolones Other (See Comments)    Patient on Amiodarone and can prolong QT   Penicillins Other (See Comments)    UNSPECIFIED REACTION  Patient does not remember reaction.  Has patient had a PCN reaction causing immediate rash, facial/tongue/throat swelling, SOB or lightheadedness with hypotension: no Has patient had a PCN reaction causing severe rash involving mucus membranes or skin necrosis: no Has patient had a PCN reaction that required hospitalization no Has patient had a PCN reaction occurring within the last 10 years: no If all of the above answers are "NO", then may proceed with Cephalosporin use.    Sulfa Antibiotics Other (See Comments)    UNSPECIFIED REACTION  "maybe vision issues? "    Family History  Problem Relation Age of Onset   Diabetes Mother    Stroke Mother        Carotid artery disease   Heart disease Father        CAD   Coronary artery disease Father    Diabetes Sister     Prior to Admission medications   Medication Sig Start Date End Date Taking? Authorizing Provider  acetaminophen (TYLENOL) 500 MG tablet Take 500 mg by mouth every morning.   Yes [provider]  acyclovir (ZOVIRAX) 400 MG tablet Take 1 tablet (400 mg total) by mouth 2 (two) times daily. 12/17/23  Yes Jaci Standard, MD  amiodarone (PACERONE) 200 MG tablet Take 0.5 tablets (100 mg total) by mouth daily. 11/26/23  Yes Yates Decamp, MD  apixaban (ELIQUIS) 5 MG TABS tablet Take 1 tablet (5 mg total) by mouth 2 (two) times daily. 11/15/23  Yes Yates Decamp, MD  Artificial Tear Solution (SOOTHE XP) SOLN Place 1 drop into both eyes every evening.   Yes [provider]  Cholecalciferol (VITAMIN D3) 50 MCG (2000 UT) capsule Take 1 capsule (2,000 Units total) by mouth  daily. 09/11/22  Yes Jaci Standard, MD  [employer] VITAMIN B12 1000 MCG tablet TAKE 1 TABLET BY MOUTH EVERY DAY 02/15/23  Yes [name] T, PA-C  docusate sodium (COLACE) 100 MG capsule Take 100 mg by mouth daily as needed for mild constipation.   Yes [provider]  furosemide (LASIX) 20 MG tablet Take 1 tablet (20 mg total) by mouth as needed for edema. 10/23/23 01/21/24 Yes Duke, Roe Rutherford, PA  lenalidomide (REVLIMID) 10 MG capsule Take 1 capsule (10 mg total) by mouth daily. [employer] Auth # [id number]  Date Obtained 12/17/23 Take 1 capsule daily for 21 days then none for 7 days 12/17/23  Yes Jaci Standard, MD  levothyroxine (SYNTHROID, LEVOTHROID) 100 MCG tablet Take 100 mcg by mouth daily before breakfast. 12/29/15  Yes [provider]  losartan (COZAAR) 25 MG tablet Take 1 tablet (25 mg total) by mouth daily. 10/23/23  Yes Duke, Roe Rutherford, PA  metoprolol tartrate (LOPRESSOR) 50 MG tablet TAKE 1 TABLET BY MOUTH TWICE A DAY 09/19/23  Yes Yates Decamp, MD  Multiple Vitamins-Minerals (PRESERVISION AREDS) CAPS Take 1 capsule by mouth 2 (two) times daily.   Yes [provider]  pantoprazole (PROTONIX) 40 MG tablet Take 1 tablet (40 mg total) by mouth daily at 6 (six) AM. 10/03/23  Yes Rodolph Bong, MD  polyethylene glycol (MIRALAX / GLYCOLAX) 17 g packet Take 17 g by mouth daily as needed for mild constipation.   Yes [provider]  potassium chloride (KLOR-CON) 10 MEQ tablet Take 1 tablet (10 mEq total) by mouth as needed (TAKE WITH LASIX FOR SWELLING). 10/23/23 01/21/24 Yes Duke, Roe Rutherford, PA  pravastatin (PRAVACHOL) 40 MG tablet Take 40 mg by mouth every evening.   Yes [provider]    Physical Exam: Constitutional: Moderately built and nourished. Vitals:   01/08/24 2245 01/08/24 2250 01/08/24 2255 01/08/24 2300  BP:    (!) 148/52  Pulse: (!) 56 (!) 57 (!) 58 60  Resp: (!) 22 (!) 22 (!) 23 20  Temp:      TempSrc:      SpO2: 96%  96% 95% 96%  Weight:      Height:       Eyes: Anicteric no pallor. ENMT: No discharge from the ears patient was normal. Neck: No mass felt.  No neck rigidity. Respiratory: No rhonchi or crepitations. Cardiovascular: S1-S2 heard. Abdomen: Soft nontender bowel sounds present. Musculoskeletal: No edema. Skin: No rash. Neurologic: Alert awake oriented to time place and person.  Moves all extremities. Psychiatric: Appears normal.  Normal affect.   Labs on Admission: I have personally reviewed following labs and imaging studies  CBC: Recent Labs  Lab 01/08/24 1354 01/08/24 1413  WBC 7.1  --   HGB 12.7 13.3  HCT 38.8 39.0  MCV 89.4  --   PLT 102*  --    Basic Metabolic Panel: Recent Labs  Lab 01/08/24 1354 01/08/24 1413  NA 138 137  K 3.8 3.6  CL 99 98  CO2 28  --   GLUCOSE 130* 124*  BUN 19 20  CREATININE 1.02* 1.00  CALCIUM 8.8*  --    GFR: Estimated Creatinine Clearance: 37.7 mL/min (by C-G formula based on SCr of 1 mg/dL). Liver Function Tests: Recent Labs  Lab 01/08/24 1354  AST 22  ALT 15  ALKPHOS 70  BILITOT 0.7  PROT 5.8*  ALBUMIN 3.2*   No results for input(s): "LIPASE", "AMYLASE" in the last 168 hours. No results for input(s): "AMMONIA" in the last 168 hours. Coagulation Profile: Recent Labs  Lab 01/08/24 1354  INR 1.3*   Cardiac Enzymes: No results for input(s): "CKTOTAL", "CKMB", "CKMBINDEX", "TROPONINI" in the last 168 hours. BNP (last 3 results) No results for input(s): "PROBNP" in the last 8760 hours. HbA1C: No results for input(s): "HGBA1C" in the last 72 hours. CBG: No results for input(s): "GLUCAP" in the last 168 hours. Lipid Profile: No results for input(s): "CHOL", "HDL", "LDLCALC", "TRIG", "CHOLHDL", "LDLDIRECT" in the last 72 hours. Thyroid Function Tests: No results for input(s): "TSH", "T4TOTAL", "FREET4", "T3FREE", "THYROIDAB" in the last 72 hours. Anemia Panel: No results for input(s): "VITAMINB12", "FOLATE", "FERRITIN",  "TIBC", "IRON", "RETICCTPCT" in the last 72 hours. Urine analysis:    Component Value Date/Time   COLORURINE  STRAW (A) 01/08/2024 1557   APPEARANCEUR CLEAR 01/08/2024 1557   LABSPEC 1.005 01/08/2024 1557   PHURINE 7.0 01/08/2024 1557   GLUCOSEU NEGATIVE 01/08/2024 1557   HGBUR NEGATIVE 01/08/2024 1557   BILIRUBINUR NEGATIVE 01/08/2024 1557   BILIRUBINUR negative 07/21/2023 1252   KETONESUR NEGATIVE 01/08/2024 1557   PROTEINUR NEGATIVE 01/08/2024 1557   UROBILINOGEN 0.2 07/21/2023 1252   NITRITE NEGATIVE 01/08/2024 1557   LEUKOCYTESUR NEGATIVE 01/08/2024 1557   Sepsis Labs: @LABRCNTIP (procalcitonin:4,lacticidven:4) )No results found for this or any previous visit (from the past 240 hours).   Radiological Exams on Admission: CT CERVICAL SPINE WO CONTRAST Result Date: 01/08/2024 CLINICAL DATA:  Larey Seat, hit head EXAM: CT CERVICAL SPINE WITHOUT CONTRAST TECHNIQUE: Multidetector CT imaging of the cervical spine was performed without intravenous contrast. Multiplanar CT image reconstructions were also generated. RADIATION DOSE REDUCTION: This exam was performed according to the departmental dose-optimization program which includes automated exposure control, adjustment of the mA and/or kV according to patient size and/or use of iterative reconstruction technique. COMPARISON:  02/19/2021 FINDINGS: Alignment: Alignment is grossly anatomic. Skull base and vertebrae: No acute fracture. No primary bone lesion or focal pathologic process. Soft tissues and spinal canal: No prevertebral fluid or swelling. No visible canal hematoma. Disc levels: Partial bony fusion across the C3-4 and C4-5 levels. Stable hypertrophic changes at C1-C2. Multilevel facet hypertrophy most pronounced at C5-6 and C6-7. Upper chest: Airway is patent.  Lung apices are clear. Other: Reconstructed images demonstrate no additional findings. IMPRESSION: 1. No acute cervical spine fracture. 2. Stable multilevel cervical degenerative  changes. Electronically Signed   By: Sharlet Salina M.D.   On: 01/08/2024 16:21   CT HEAD WO CONTRAST Result Date: 01/08/2024 CLINICAL DATA:  Larey Seat, hit head, anticoagulated EXAM: CT HEAD WITHOUT CONTRAST TECHNIQUE: Contiguous axial images were obtained from the base of the skull through the vertex without intravenous contrast. RADIATION DOSE REDUCTION: This exam was performed according to the departmental dose-optimization program which includes automated exposure control, adjustment of the mA and/or kV according to patient size and/or use of iterative reconstruction technique. COMPARISON:  09/05/2023 FINDINGS: Brain: Hypodensities within the periventricular white matter are again noted, compatible with chronic small vessel ischemic changes. No evidence of acute infarct or hemorrhage. The lateral ventricles and midline structures are unremarkable. No acute extra-axial fluid collections. No mass effect. Vascular: No hyperdense vessel or unexpected calcification. Skull: Normal. Negative for fracture or focal lesion. Sinuses/Orbits: Postsurgical changes from prior medial wall antrectomies and bilateral ethmoidectomies. Minimal retained secretions within the maxillary and sphenoid sinuses. Other: None. IMPRESSION: 1. No acute intracranial process. 2. Stable chronic small-vessel ischemic changes within the white matter. Electronically Signed   By: Sharlet Salina M.D.   On: 01/08/2024 16:18   DG Pelvis Portable Result Date: 01/08/2024 CLINICAL DATA:  Larey Seat EXAM: PORTABLE PELVIS 1-2 VIEWS COMPARISON:  09/30/2022 FINDINGS: Supine frontal view of the pelvis includes both hips. No acute displaced fracture. Symmetrical bilateral hip osteoarthritis. The sacroiliac joints are normal. IMPRESSION: 1. No acute displaced fracture. 2. Symmetrical bilateral hip osteoarthritis. Electronically Signed   By: Sharlet Salina M.D.   On: 01/08/2024 16:16   DG Chest Port 1 View Result Date: 01/08/2024 CLINICAL DATA:  Larey Seat, atrial  fibrillation EXAM: PORTABLE CHEST 1 VIEW COMPARISON:  10/01/2023 FINDINGS: Single frontal view of the chest demonstrates a stable cardiac silhouette. Stable aortic valve prosthesis. No acute airspace disease, effusion, or pneumothorax. No acute bony abnormalities. IMPRESSION: 1. No acute intrathoracic process. Electronically Signed   By: Casimiro Needle  Manson Passey M.D.   On: 01/08/2024 16:16    EKG: Independently reviewed.  Normal sinus rhythm.  Assessment/Plan Principal Problem:   Syncope Active Problems:   Chest pain   Chronic diastolic CHF (congestive heart failure) (HCC)   Hypertension   Hyperlipidemia   Multiple myeloma (HCC)   Hypothyroidism   History of breast cancer, DCIS, lumpectomy March 2000   GERD (gastroesophageal reflux disease)   Chronic anticoagulation    Syncope -    cause not clear could be vasovagal given syncope associated with nausea vomiting.  Has had prior history of dizziness.  Was given fluid bolus in the ER.  Will closely monitor in telemetry. Chest pain with elevated troponin presently chest pain-free.  Normal coronaries per cardiac cath in 2019.  Troponins are elevated but flat.  Will trend cardiac markers.  Cardiology has been consulted for further recommendations.  On heparin statins and beta-blockers. Chronic diastolic CHF appears compensated.  Last 2D echo done in June 2024 showed EF of 60 to 65% with grade 2 diastolic dysfunction. History of TAVR last 2D echo done was in June 2024. Hypothyroidism on Synthroid. Multiple myeloma on Revlimid followed by oncologist. Paroxysmal atrial fibrillation on amiodarone and presently on heparin fusion.  Takes Eliquis at home. Chronic kidney stage III creatinine at around baseline. Hypertension on losartan and beta-blockers. Prior history of CVA on statins and Eliquis. Hyperlipidemia on statins.  Since patient has syncope and chest pain will need close monitoring and further workup and more than 2 midnight stay.   DVT  prophylaxis: Heparin infusion. Code Status: Full code. Family Communication: Gust with patient. Disposition Plan: Monitored bed. Consults called: Cardiology. Admission status: Observation.

## 2024-01-08 NOTE — ED Notes (Signed)
 EDP at [facility]

## 2024-01-08 NOTE — Progress Notes (Signed)
 ANTICOAGULATION CONSULT NOTE  Pharmacy Consult for Heparin Indication: chest pain/ACS  Allergies  Allergen Reactions   Quinolones Other (See Comments)    Patient on Amiodarone and can prolong QT   Penicillins Other (See Comments)    UNSPECIFIED REACTION  Patient does not remember reaction.  Has patient had a PCN reaction causing immediate rash, facial/tongue/throat swelling, SOB or lightheadedness with hypotension: no Has patient had a PCN reaction causing severe rash involving mucus membranes or skin necrosis: no Has patient had a PCN reaction that required hospitalization no Has patient had a PCN reaction occurring within the last 10 years: no If all of the above answers are "NO", then may proceed with Cephalosporin use.    Sulfa Antibiotics Other (See Comments)    UNSPECIFIED REACTION  "maybe vision issues? "    Patient Measurements: Height: 5\' 3"  (160 cm) Weight: 74.8 kg (165 lb) IBW/kg (Calculated) : 52.4 Heparin Dosing Weight: 68.3 kg  Vital Signs: Temp: 98.3 F (36.8 C) (03/18 1342) Temp Source: Oral (03/18 1342) BP: 154/100 (03/18 1730) Pulse Rate: 67 (03/18 1730)  Labs: Recent Labs    01/08/24 1354 01/08/24 1413 01/08/24 1616  HGB 12.7 13.3  --   HCT 38.8 39.0  --   PLT 102*  --   --   LABPROT 16.4*  --   --   INR 1.3*  --   --   CREATININE 1.02* 1.00  --   TROPONINIHS  --   --  108*    Estimated Creatinine Clearance: 37.7 mL/min (by C-G formula based on SCr of 1 mg/dL).   Medical History: Past Medical History:  Diagnosis Date   Arthritis    some - per patient   Breast cancer (HCC)    breast cancer / left    Cataract    bilat    GERD (gastroesophageal reflux disease)    History of kidney stones    Hyperlipidemia    Hypertension    Hypothyroidism    Macular degeneration    Left   S/P TAVR (transcatheter aortic valve replacement) 09/03/2018   23 mm Edwards Sapien 3 transcatheter heart valve placed via percutaneous right transfemoral  approach    Severe aortic stenosis    Stress incontinence    Thyroid disease    Tinnitus     Medications:  (Not in a hospital admission)  Scheduled:   aspirin  324 mg Oral Once   Infusions:  PRN: nitroGLYCERIN  Assessment: 48 yof with a history of AF on eliquis, AVR, HF, HTN, DM. Patient is presenting with dizzy. Heparin per pharmacy consult placed for chest pain/ACS.  Patient is on apixaban prior to arrival. Last dose 3/18 ~1100 per patient. Will require aPTT monitoring due to likely falsely high anti-Xa level secondary to DOAC use.  Hgb 13.3; plt 102 Pt/INR 16.4 / 1.3  Goal of Therapy:  Heparin level 0.3-0.7 units/ml aPTT 66-102 seconds Monitor platelets by anticoagulation protocol: Yes   Plan:  No initial heparin bolus Start heparin infusion at 800 units/hr at 11pm 3/18 Check aPTT & anti-Xa level at 0700 3/19 and daily while on heparin Continue to monitor via aPTT until levels are correlated Continue to monitor H&H and platelets  Delmar Landau, PharmD, BCPS 01/08/2024 6:06 PM ED Clinical Pharmacist -  [phone]

## 2024-01-08 NOTE — ED Triage Notes (Signed)
 BIB [facility] from home. Level 2, FOT. Hit posterior head, emesis x2 PTA. Fell at 8am. She woke on floor. C/o posterior HA. No deformity noted. Takes eliquis for afib. Later when her hair dresser was doing her hair, she vomited and went unresponsive. Vomited x1 for EMS as well. A&Ox4, no defecits. Alert, NAD, calm, interactive, appropriate, resps e/u, skin W&D, LS CTA, MAEx4, follows commands, no current nausea.Took her usual meds this am. No IV PTA.

## 2024-01-08 NOTE — ED Provider Notes (Signed)
 [place] EMERGENCY DEPARTMENT AT [facility] Provider Note   CSN: [id number] Arrival date & time: 01/08/24  1340     History  Chief Complaint  Patient presents with   Fall   Trauma    Zoe [name] is a 89 y.o. female.  Patient is an 89 year old female with a past medical history of A-fib on Eliquis, CHF, hypothyroidism, hypertension presenting to the emergency department after a fall.  The patient states that she was up this morning getting ready for the day and when she went back into her room to change out of her nightgown she was taking her arm out of the sleep when she got dizzy and fell backwards and hit the back of her head.  She states that she thinks that she did have a brief episode of loss of consciousness.  She states that her son and caretaker were able to get her up off the floor after about 30 minutes and she states that she initially had a mild headache but otherwise felt okay.  She states that in later this morning her hairdresser came to do her hair and was told that she passed out while she was in the chair.  She states that she then had nausea and vomited twice but only remembers vomiting 1 time.  EMS states that they she was awake and alert on their arrival but did vomit and route to the hospital.  She states that her headache is only mild at this time and denies any other pain or injuries from the fall.  She states that she does have frequent dizziness and is off balance frequently from all her medications.  Denies any recent chest pain or shortness of breath.  Denies any associated abdominal pain.  The history is provided by the patient and the EMS personnel.  Fall  Trauma Mechanism of injury: Fall       Home Medications Prior to Admission medications   Medication Sig Start Date End Date Taking? Authorizing Provider  acyclovir (ZOVIRAX) 400 MG tablet Take 1 tablet (400 mg total) by mouth 2 (two) times daily. 12/17/23   Jaci Standard, MD   albuterol (VENTOLIN HFA) 108 (90 Base) MCG/ACT inhaler Inhale 2 puffs into the lungs every 6 (six) hours as needed for wheezing or shortness of breath. Patient not taking: Reported on 10/23/2023 05/10/23   Luciano Cutter, MD  amiodarone (PACERONE) 200 MG tablet Take 0.5 tablets (100 mg total) by mouth daily. 11/26/23   Yates Decamp, MD  apixaban (ELIQUIS) 5 MG TABS tablet Take 1 tablet (5 mg total) by mouth 2 (two) times daily. 11/15/23   Yates Decamp, MD  Artificial Tear Solution (SOOTHE XP) SOLN Place 1 drop into both eyes every evening.    [provider]  Cholecalciferol (VITAMIN D3) 50 MCG (2000 UT) capsule Take 1 capsule (2,000 Units total) by mouth daily. 09/11/22   Jaci Standard, MD  [employer] VITAMIN B12 1000 MCG tablet TAKE 1 TABLET BY MOUTH EVERY DAY 02/15/23   [name] T, PA-C  docusate sodium (COLACE) 100 MG capsule Take 100 mg by mouth daily as needed for mild constipation.    [provider]  furosemide (LASIX) 20 MG tablet Take 1 tablet (20 mg total) by mouth as needed for edema. 10/23/23 01/21/24  Duke, Roe Rutherford, PA  KLOR-CON M10 10 MEQ tablet Take 10 mEq by mouth daily. 06/15/23   [provider]  lenalidomide (REVLIMID) 10 MG capsule Take  1 capsule (10 mg total) by mouth daily. [employer] Auth # [id number]  Date Obtained 12/17/23 Take 1 capsule daily for 21 days then none for 7 days 12/17/23   Jaci Standard, MD  levothyroxine (SYNTHROID, LEVOTHROID) 100 MCG tablet Take 100 mcg by mouth daily before breakfast. 12/29/15   [provider]  losartan (COZAAR) 25 MG tablet Take 1 tablet (25 mg total) by mouth daily. 10/23/23   Duke, Roe Rutherford, PA  metoprolol tartrate (LOPRESSOR) 50 MG tablet TAKE 1 TABLET BY MOUTH TWICE A DAY 09/19/23   Yates Decamp, MD  Multiple Vitamins-Minerals (PRESERVISION AREDS) CAPS Take 1 capsule by mouth 2 (two) times daily.    [provider]  pantoprazole (PROTONIX) 40 MG tablet Take 1 tablet (40 mg total) by mouth  daily at 6 (six) AM. 10/03/23   Rodolph Bong, MD  polyethylene glycol (MIRALAX / GLYCOLAX) 17 g packet Take 17 g by mouth daily as needed for mild constipation.    [provider]  potassium chloride (KLOR-CON) 10 MEQ tablet Take 1 tablet (10 mEq total) by mouth as needed (TAKE WITH LASIX FOR SWELLING). 10/23/23 01/21/24  Marcelino Duster, PA  pravastatin (PRAVACHOL) 40 MG tablet Take 40 mg by mouth every evening.    [provider]      Allergies    Quinolones, Penicillins, and Sulfa antibiotics    Review of Systems   Review of Systems  Physical Exam Updated Vital Signs BP (!) 166/61   Pulse 65   Temp 98.3 F (36.8 C) (Oral)   Resp 16   Ht 5\' 3"  (1.6 m)   Wt 74.8 kg   SpO2 97%   BMI 29.23 kg/m  Physical Exam Vitals and nursing note reviewed.  Constitutional:      General: She is not in acute distress.    Appearance: Normal appearance.  HENT:     Head: Normocephalic and atraumatic.     Nose: Nose normal.     Mouth/Throat:     Mouth: Mucous membranes are moist.     Pharynx: Oropharynx is clear.  Eyes:     Extraocular Movements: Extraocular movements intact.     Conjunctiva/sclera: Conjunctivae normal.     Pupils: Pupils are equal, round, and reactive to light.  Neck:     Comments: No midline neck tenderness Cardiovascular:     Rate and Rhythm: Normal rate and regular rhythm.     Heart sounds: Normal heart sounds.  Pulmonary:     Effort: Pulmonary effort is normal.     Breath sounds: Normal breath sounds.  Abdominal:     General: Abdomen is flat.     Palpations: Abdomen is soft.     Tenderness: There is no abdominal tenderness.  Musculoskeletal:        General: Normal range of motion.     Cervical back: Normal range of motion and neck supple.     Comments: No midline back tenderness Pelvis stable, nontender No bony tenderness in bilateral upper or lower extremities  Skin:    General: Skin is warm and dry.  Neurological:     General:  No focal deficit present.     Mental Status: She is alert and oriented to person, place, and time.     Sensory: No sensory deficit.     Motor: No weakness.  Psychiatric:        Mood and Affect: Mood normal.        Behavior: Behavior normal.  ED Results / Procedures / Treatments   Labs (all labs ordered are listed, but only abnormal results are displayed) Labs Reviewed  COMPREHENSIVE METABOLIC PANEL - Abnormal; Notable for the following components:      Result Value   Glucose, Bld 130 (*)    Creatinine, Ser 1.02 (*)    Calcium 8.8 (*)    Total Protein 5.8 (*)    Albumin 3.2 (*)    GFR, Estimated 53 (*)    All other components within normal limits  CBC - Abnormal; Notable for the following components:   Platelets 102 (*)    All other components within normal limits  PROTIME-INR - Abnormal; Notable for the following components:   Prothrombin Time 16.4 (*)    INR 1.3 (*)    All other components within normal limits  I-STAT CHEM 8, ED - Abnormal; Notable for the following components:   Glucose, Bld 124 (*)    Calcium, Ion 1.12 (*)    All other components within normal limits  I-STAT CG4 LACTIC ACID, ED - Abnormal; Notable for the following components:   Lactic Acid, Venous 2.2 (*)    All other components within normal limits  ETHANOL  URINALYSIS, ROUTINE W REFLEX MICROSCOPIC  I-STAT CG4 LACTIC ACID, ED  SAMPLE TO BLOOD BANK  TROPONIN I (HIGH SENSITIVITY)    EKG EKG Interpretation Date/Time:  Tuesday January 08 2024 13:42:13 EDT Ventricular Rate:  59 PR Interval:  210 QRS Duration:  93 QT Interval:  487 QTC Calculation: 483 R Axis:   34  Text Interpretation: Sinus rhythm Borderline ST depression, diffuse leads Nonspecific TW changes compared to prior Confirmed by Alvira Monday ([id number]) on 01/08/2024 3:05:09 PM  Radiology No results found.  Procedures Procedures    Medications Ordered in ED Medications  sodium chloride 0.9 % bolus 1,000 mL (0 mLs  Intravenous Stopped 01/08/24 1517)    ED Course/ Medical Decision Making/ A&P Clinical Course as of 01/08/24 1538  Tue Jan 08, 2024  1537 Patient signed out to Dr. Dalene Seltzer pending CT reads and reassessment for disposition.  [VK]    Clinical Course User Index [VK] Rexford Maus, DO                                 Medical Decision Making This patient presents to the ED with chief complaint(s) of fall on thinners, syncope with pertinent past medical history of A-fib on Eliquis, hypertension, CHF, hypothyroidism which further complicates the presenting complaint. The complaint involves an extensive differential diagnosis and also carries with it a high risk of complications and morbidity.    The differential diagnosis includes due to patient's fall on thinners concern for ICH, mass effect, cervical spine injury, concern for syncopal fall, arrhythmia, anemia, dehydration, electrolyte abnormality, no other traumatic injury seen on exam  Additional history obtained: Additional history obtained from EMS  Records reviewed outpatient oncology records  ED Course and Reassessment: On patient's arrival she is hemodynamically stable in no acute distress.  She was a prehospital arrival level 2 trauma and I was present at bedside on her arrival.  Primary survey was intact.  No significant injury seen on secondary survey.  Patient will have head CT, C-spine, chest and pelvis x-ray to evaluate for traumatic injury and cause of her syncope.  She will have labs and EKG performed and will be closely reassessed.  Independent labs interpretation:  The following labs were independently  interpreted: mildly elevated lactic, otherwise at baseline  Independent visualization of imaging: -Pending    Amount and/or Complexity of Data Reviewed Labs: ordered. Radiology: ordered.          Final Clinical Impression(s) / ED Diagnoses Final diagnoses:  Fall, initial encounter  Syncope, unspecified  syncope type    Rx / DC Orders ED Discharge Orders     None         Rexford Maus, DO 01/08/24 1538

## 2024-01-08 NOTE — Progress Notes (Signed)
 Orthopedic Tech Progress Note Patient Details:  Zoe [name] 06-Jun-1934 [id number]  LEVEL 2 TRAUMA   Patient ID: [name], female   DOB: [birth year], 89 y.o.   MRN: [id number]  [name] 01/08/2024, 1:42 PM

## 2024-01-08 NOTE — ED Notes (Addendum)
 EDP finished at [facility]. IVs established, blood sent, warm blanket given, pt alert, NAD, calm, interactive, resps e/u, speaking in clear complete sentences, VSS. BP mildly elevated. SB on monitor, HR 56. Pending CT.

## 2024-01-08 NOTE — ED Notes (Signed)
 CCM called and admitted pt in system.

## 2024-01-08 NOTE — ED Notes (Signed)
 Radiology at bedside

## 2024-01-08 NOTE — ED Notes (Signed)
 Lab at [facility] for repeat trop and lactic

## 2024-01-08 NOTE — ED Notes (Signed)
 Pt was assisted with changing her brief into a clean dry one that was supplied by her daughter. Warm blanket given.   Pharmacy advised to start Heparin around 23:00, no sooner d/t heparin levels. Pt is aware and verbalized understanding.

## 2024-01-09 ENCOUNTER — Inpatient Hospital Stay (HOSPITAL_COMMUNITY)
Admission: EM | Disposition: A | Discharge status: Skilled Nursing Facility | Payer: Self-pay | Source: Home / Self Care | Attending: Internal Medicine

## 2024-01-09 ENCOUNTER — Encounter (HOSPITAL_COMMUNITY)
Admission: EM | Disposition: A | Discharge status: Skilled Nursing Facility | Payer: Self-pay | Source: Home / Self Care | Attending: Internal Medicine

## 2024-01-09 ENCOUNTER — Inpatient Hospital Stay (HOSPITAL_COMMUNITY)

## 2024-01-09 ENCOUNTER — Observation Stay (HOSPITAL_COMMUNITY)

## 2024-01-09 ENCOUNTER — Ambulatory Visit (HOSPITAL_COMMUNITY): Admit: 2024-01-09 | Admitting: Internal Medicine

## 2024-01-09 ENCOUNTER — Encounter (HOSPITAL_COMMUNITY): Payer: Self-pay | Admitting: Internal Medicine

## 2024-01-09 DIAGNOSIS — N200 Calculus of kidney: Secondary | ICD-10-CM | POA: Diagnosis not present

## 2024-01-09 DIAGNOSIS — I214 Non-ST elevation (NSTEMI) myocardial infarction: Secondary | ICD-10-CM

## 2024-01-09 DIAGNOSIS — I10 Essential (primary) hypertension: Secondary | ICD-10-CM | POA: Diagnosis not present

## 2024-01-09 DIAGNOSIS — I700 Atherosclerosis of aorta: Secondary | ICD-10-CM | POA: Diagnosis not present

## 2024-01-09 DIAGNOSIS — Z79899 Other long term (current) drug therapy: Secondary | ICD-10-CM | POA: Diagnosis not present

## 2024-01-09 DIAGNOSIS — R079 Chest pain, unspecified: Secondary | ICD-10-CM

## 2024-01-09 DIAGNOSIS — Z823 Family history of stroke: Secondary | ICD-10-CM | POA: Diagnosis not present

## 2024-01-09 DIAGNOSIS — I272 Pulmonary hypertension, unspecified: Secondary | ICD-10-CM | POA: Diagnosis not present

## 2024-01-09 DIAGNOSIS — N1832 Chronic kidney disease, stage 3b: Secondary | ICD-10-CM | POA: Insufficient documentation

## 2024-01-09 DIAGNOSIS — M6281 Muscle weakness (generalized): Secondary | ICD-10-CM | POA: Diagnosis not present

## 2024-01-09 DIAGNOSIS — Z833 Family history of diabetes mellitus: Secondary | ICD-10-CM | POA: Diagnosis not present

## 2024-01-09 DIAGNOSIS — I459 Conduction disorder, unspecified: Secondary | ICD-10-CM | POA: Diagnosis not present

## 2024-01-09 DIAGNOSIS — R918 Other nonspecific abnormal finding of lung field: Secondary | ICD-10-CM | POA: Diagnosis not present

## 2024-01-09 DIAGNOSIS — I480 Paroxysmal atrial fibrillation: Secondary | ICD-10-CM | POA: Diagnosis not present

## 2024-01-09 DIAGNOSIS — I5033 Acute on chronic diastolic (congestive) heart failure: Secondary | ICD-10-CM | POA: Diagnosis not present

## 2024-01-09 DIAGNOSIS — N183 Chronic kidney disease, stage 3 unspecified: Secondary | ICD-10-CM | POA: Insufficient documentation

## 2024-01-09 DIAGNOSIS — Z79890 Hormone replacement therapy (postmenopausal): Secondary | ICD-10-CM | POA: Diagnosis not present

## 2024-01-09 DIAGNOSIS — Z882 Allergy status to sulfonamides status: Secondary | ICD-10-CM | POA: Diagnosis not present

## 2024-01-09 DIAGNOSIS — C9000 Multiple myeloma not having achieved remission: Secondary | ICD-10-CM | POA: Diagnosis not present

## 2024-01-09 DIAGNOSIS — Z96653 Presence of artificial knee joint, bilateral: Secondary | ICD-10-CM | POA: Diagnosis present

## 2024-01-09 DIAGNOSIS — Z8249 Family history of ischemic heart disease and other diseases of the circulatory system: Secondary | ICD-10-CM | POA: Diagnosis not present

## 2024-01-09 DIAGNOSIS — I442 Atrioventricular block, complete: Secondary | ICD-10-CM | POA: Diagnosis not present

## 2024-01-09 DIAGNOSIS — Z950 Presence of cardiac pacemaker: Secondary | ICD-10-CM | POA: Diagnosis not present

## 2024-01-09 DIAGNOSIS — R55 Syncope and collapse: Secondary | ICD-10-CM | POA: Diagnosis not present

## 2024-01-09 DIAGNOSIS — R1312 Dysphagia, oropharyngeal phase: Secondary | ICD-10-CM | POA: Diagnosis not present

## 2024-01-09 DIAGNOSIS — Z7401 Bed confinement status: Secondary | ICD-10-CM | POA: Diagnosis not present

## 2024-01-09 DIAGNOSIS — Y92008 Other place in unspecified non-institutional (private) residence as the place of occurrence of the external cause: Secondary | ICD-10-CM | POA: Diagnosis not present

## 2024-01-09 DIAGNOSIS — J9811 Atelectasis: Secondary | ICD-10-CM | POA: Diagnosis not present

## 2024-01-09 DIAGNOSIS — R531 Weakness: Secondary | ICD-10-CM | POA: Diagnosis not present

## 2024-01-09 DIAGNOSIS — E039 Hypothyroidism, unspecified: Secondary | ICD-10-CM | POA: Diagnosis not present

## 2024-01-09 DIAGNOSIS — W010XXA Fall on same level from slipping, tripping and stumbling without subsequent striking against object, initial encounter: Secondary | ICD-10-CM | POA: Diagnosis present

## 2024-01-09 DIAGNOSIS — Z952 Presence of prosthetic heart valve: Secondary | ICD-10-CM | POA: Diagnosis not present

## 2024-01-09 DIAGNOSIS — R1013 Epigastric pain: Secondary | ICD-10-CM | POA: Diagnosis not present

## 2024-01-09 DIAGNOSIS — I499 Cardiac arrhythmia, unspecified: Secondary | ICD-10-CM | POA: Diagnosis not present

## 2024-01-09 DIAGNOSIS — I130 Hypertensive heart and chronic kidney disease with heart failure and stage 1 through stage 4 chronic kidney disease, or unspecified chronic kidney disease: Secondary | ICD-10-CM | POA: Diagnosis not present

## 2024-01-09 DIAGNOSIS — I495 Sick sinus syndrome: Secondary | ICD-10-CM | POA: Diagnosis not present

## 2024-01-09 DIAGNOSIS — I2510 Atherosclerotic heart disease of native coronary artery without angina pectoris: Secondary | ICD-10-CM

## 2024-01-09 DIAGNOSIS — I161 Hypertensive emergency: Secondary | ICD-10-CM | POA: Diagnosis not present

## 2024-01-09 DIAGNOSIS — Z7901 Long term (current) use of anticoagulants: Secondary | ICD-10-CM | POA: Diagnosis not present

## 2024-01-09 DIAGNOSIS — I351 Nonrheumatic aortic (valve) insufficiency: Secondary | ICD-10-CM

## 2024-01-09 DIAGNOSIS — Z881 Allergy status to other antibiotic agents status: Secondary | ICD-10-CM | POA: Diagnosis not present

## 2024-01-09 DIAGNOSIS — I517 Cardiomegaly: Secondary | ICD-10-CM | POA: Diagnosis not present

## 2024-01-09 DIAGNOSIS — E876 Hypokalemia: Secondary | ICD-10-CM | POA: Diagnosis present

## 2024-01-09 DIAGNOSIS — R278 Other lack of coordination: Secondary | ICD-10-CM | POA: Diagnosis not present

## 2024-01-09 DIAGNOSIS — Z880 Allergy status to penicillin: Secondary | ICD-10-CM | POA: Diagnosis not present

## 2024-01-09 DIAGNOSIS — Z953 Presence of xenogenic heart valve: Secondary | ICD-10-CM | POA: Diagnosis not present

## 2024-01-09 DIAGNOSIS — E785 Hyperlipidemia, unspecified: Secondary | ICD-10-CM | POA: Diagnosis not present

## 2024-01-09 DIAGNOSIS — F419 Anxiety disorder, unspecified: Secondary | ICD-10-CM | POA: Diagnosis present

## 2024-01-09 DIAGNOSIS — Z743 Need for continuous supervision: Secondary | ICD-10-CM | POA: Diagnosis not present

## 2024-01-09 DIAGNOSIS — I5032 Chronic diastolic (congestive) heart failure: Secondary | ICD-10-CM | POA: Diagnosis not present

## 2024-01-09 DIAGNOSIS — Z8673 Personal history of transient ischemic attack (TIA), and cerebral infarction without residual deficits: Secondary | ICD-10-CM | POA: Diagnosis not present

## 2024-01-09 DIAGNOSIS — R2681 Unsteadiness on feet: Secondary | ICD-10-CM | POA: Diagnosis not present

## 2024-01-09 DIAGNOSIS — Z853 Personal history of malignant neoplasm of breast: Secondary | ICD-10-CM | POA: Diagnosis not present

## 2024-01-09 DIAGNOSIS — H04123 Dry eye syndrome of bilateral lacrimal glands: Secondary | ICD-10-CM | POA: Diagnosis not present

## 2024-01-09 DIAGNOSIS — K5900 Constipation, unspecified: Secondary | ICD-10-CM | POA: Diagnosis not present

## 2024-01-09 DIAGNOSIS — K219 Gastro-esophageal reflux disease without esophagitis: Secondary | ICD-10-CM | POA: Diagnosis not present

## 2024-01-09 HISTORY — PX: PACEMAKER IMPLANT: EP1218

## 2024-01-09 LAB — ECHOCARDIOGRAM COMPLETE
AR max vel: 1.03 cm2
AV Area VTI: 1 cm2
AV Area mean vel: 1.01 cm2
AV Mean grad: 9 mmHg
AV Peak grad: 17.1 mmHg
Ao pk vel: 2.07 m/s
Area-P 1/2: 3.72 cm2
Height: 63 in
MV M vel: 4.68 m/s
MV Peak grad: 87.6 mmHg
S' Lateral: 2.8 cm
Weight: 2640 [oz_av]

## 2024-01-09 LAB — HEPARIN LEVEL (UNFRACTIONATED): Heparin Unfractionated: >1.1 [IU]/mL — ABNORMAL HIGH (ref 0.30–0.70)

## 2024-01-09 LAB — BASIC METABOLIC PANEL
Anion gap: 6 (ref 5–15)
BUN: 18 mg/dL (ref 8–23)
CO2: 27 mmol/L (ref 22–32)
Calcium: 8 mg/dL — ABNORMAL LOW (ref 8.9–10.3)
Chloride: 106 mmol/L (ref 98–111)
Creatinine, Ser: 0.89 mg/dL (ref 0.44–1.00)
GFR, Estimated: >60 mL/min (ref >60)
Glucose, Bld: 91 mg/dL (ref 70–99)
Potassium: 3.2 mmol/L — ABNORMAL LOW (ref 3.5–5.1)
Sodium: 139 mmol/L (ref 135–145)

## 2024-01-09 LAB — TROPONIN I (HIGH SENSITIVITY)
Troponin I (High Sensitivity): 81 ng/L — ABNORMAL HIGH (ref <18)
Troponin I (High Sensitivity): 74 ng/L — ABNORMAL HIGH (ref <18)
Troponin I (High Sensitivity): 765 ng/L (ref <18)
Troponin I (High Sensitivity): 663 ng/L (ref <18)

## 2024-01-09 LAB — CBC
HCT: 36.4 % (ref 36.0–46.0)
Hemoglobin: 11.7 g/dL — ABNORMAL LOW (ref 12.0–15.0)
MCH: 29 pg (ref 26.0–34.0)
MCHC: 32.1 g/dL (ref 30.0–36.0)
MCV: 90.3 fL (ref 80.0–100.0)
Platelets: 124 10*3/uL — ABNORMAL LOW (ref 150–400)
RBC: 4.03 MIL/uL (ref 3.87–5.11)
RDW: 15.9 % — ABNORMAL HIGH (ref 11.5–15.5)
WBC: 5.8 10*3/uL (ref 4.0–10.5)
nRBC: 0 % (ref 0.0–0.2)

## 2024-01-09 LAB — APTT: aPTT: 64 s — ABNORMAL HIGH (ref 24–36)

## 2024-01-09 LAB — TSH: TSH: 0.891 u[IU]/mL (ref 0.350–4.500)

## 2024-01-09 SURGERY — PACEMAKER IMPLANT

## 2024-01-09 SURGERY — LEFT HEART CATH AND CORONARY ANGIOGRAPHY
Anesthesia: LOCAL

## 2024-01-09 MED ORDER — ACETAMINOPHEN 325 MG PO TABS
975.0000 mg | ORAL_TABLET | Freq: Three times a day (TID) | ORAL | Status: DC
  Administered 2024-01-09 – 2024-01-14 (×14): 975 mg via ORAL
  Filled 2024-01-09 (×14): qty 3

## 2024-01-09 MED ORDER — VANCOMYCIN HCL IN DEXTROSE 1-5 GM/200ML-% IV SOLN
1000.0000 mg | Freq: Once | INTRAVENOUS | Status: AC
  Administered 2024-01-10: 1000 mg via INTRAVENOUS
  Filled 2024-01-09: qty 200

## 2024-01-09 MED ORDER — PANTOPRAZOLE SODIUM 40 MG PO TBEC
40.0000 mg | DELAYED_RELEASE_TABLET | Freq: Every day | ORAL | Status: DC
  Administered 2024-01-09 – 2024-01-14 (×6): 40 mg via ORAL
  Filled 2024-01-09 (×6): qty 1

## 2024-01-09 MED ORDER — FENTANYL CITRATE (PF) 100 MCG/2ML IJ SOLN
INTRAMUSCULAR | Status: DC | PRN
  Administered 2024-01-09 (×3): 12.5 ug via INTRAVENOUS

## 2024-01-09 MED ORDER — VANCOMYCIN HCL IN DEXTROSE 1-5 GM/200ML-% IV SOLN
INTRAVENOUS | Status: AC | PRN
  Administered 2024-01-09: 1000 mg via INTRAVENOUS

## 2024-01-09 MED ORDER — LENALIDOMIDE 10 MG PO CAPS: 10.0000 mg | ORAL_CAPSULE | Freq: Every day | ORAL | Status: DC

## 2024-01-09 MED ORDER — CEFAZOLIN SODIUM-DEXTROSE 2-4 GM/100ML-% IV SOLN
INTRAVENOUS | Status: AC
  Filled 2024-01-09: qty 100

## 2024-01-09 MED ORDER — ONDANSETRON HCL 4 MG/2ML IJ SOLN: 4.0000 mg | Freq: Four times a day (QID) | INTRAMUSCULAR | Status: DC | PRN

## 2024-01-09 MED ORDER — ACYCLOVIR 400 MG PO TABS
400.0000 mg | ORAL_TABLET | Freq: Two times a day (BID) | ORAL | Status: DC
  Administered 2024-01-09 – 2024-01-14 (×11): 400 mg via ORAL
  Filled 2024-01-09 (×13): qty 1

## 2024-01-09 MED ORDER — SODIUM CHLORIDE 0.9% FLUSH
3.0000 mL | Freq: Two times a day (BID) | INTRAVENOUS | Status: DC
  Administered 2024-01-09: 3 mL via INTRAVENOUS

## 2024-01-09 MED ORDER — SODIUM CHLORIDE 0.9 % IV SOLN
80.0000 mg | INTRAVENOUS | Status: AC
  Administered 2024-01-09: 80 mg
  Filled 2024-01-09: qty 2

## 2024-01-09 MED ORDER — CHLORHEXIDINE GLUCONATE 4 % EX SOLN
60.0000 mL | Freq: Once | CUTANEOUS | Status: DC
  Filled 2024-01-09: qty 60

## 2024-01-09 MED ORDER — SODIUM CHLORIDE 0.9% FLUSH
3.0000 mL | INTRAVENOUS | Status: DC | PRN
Start: 2024-01-09 — End: 2024-01-09

## 2024-01-09 MED ORDER — OXYCODONE HCL 5 MG PO TABS
5.0000 mg | ORAL_TABLET | Freq: Once | ORAL | Status: AC
  Administered 2024-01-09: 5 mg via ORAL
  Filled 2024-01-09: qty 1

## 2024-01-09 MED ORDER — CEFAZOLIN SODIUM-DEXTROSE 2-4 GM/100ML-% IV SOLN
2.0000 g | INTRAVENOUS | Status: DC
  Filled 2024-01-09: qty 100

## 2024-01-09 MED ORDER — LOSARTAN POTASSIUM 25 MG PO TABS
25.0000 mg | ORAL_TABLET | Freq: Every day | ORAL | Status: DC
  Administered 2024-01-09 – 2024-01-11 (×3): 25 mg via ORAL
  Filled 2024-01-09 (×3): qty 1

## 2024-01-09 MED ORDER — VANCOMYCIN HCL IN DEXTROSE 1-5 GM/200ML-% IV SOLN
1000.0000 mg | INTRAVENOUS | Status: AC
  Filled 2024-01-09: qty 200

## 2024-01-09 MED ORDER — HEPARIN (PORCINE) IN NACL 1000-0.9 UT/500ML-% IV SOLN
INTRAVENOUS | Status: DC | PRN
Start: 2024-01-09 — End: 2024-01-09
  Administered 2024-01-09: 500 mL

## 2024-01-09 MED ORDER — OXYCODONE HCL 5 MG PO TABS
5.0000 mg | ORAL_TABLET | Freq: Four times a day (QID) | ORAL | Status: DC | PRN
  Administered 2024-01-10 – 2024-01-14 (×9): 5 mg via ORAL
  Filled 2024-01-09 (×9): qty 1

## 2024-01-09 MED ORDER — ACETAMINOPHEN 325 MG PO TABS
650.0000 mg | ORAL_TABLET | Freq: Four times a day (QID) | ORAL | Status: DC | PRN
  Administered 2024-01-09: 650 mg via ORAL
  Filled 2024-01-09: qty 2

## 2024-01-09 MED ORDER — MIDAZOLAM HCL 2 MG/2ML IJ SOLN
INTRAMUSCULAR | Status: AC
Start: 2024-01-09 — End: ?
  Filled 2024-01-09: qty 2

## 2024-01-09 MED ORDER — MIDAZOLAM HCL 2 MG/2ML IJ SOLN
INTRAMUSCULAR | Status: AC
  Filled 2024-01-09: qty 2

## 2024-01-09 MED ORDER — METOPROLOL SUCCINATE ER 50 MG PO TB24
50.0000 mg | ORAL_TABLET | Freq: Every day | ORAL | Status: DC
  Administered 2024-01-09 – 2024-01-14 (×6): 50 mg via ORAL
  Filled 2024-01-09 (×6): qty 1

## 2024-01-09 MED ORDER — AMIODARONE HCL 200 MG PO TABS: 100.0000 mg | ORAL_TABLET | Freq: Every day | ORAL | Status: DC

## 2024-01-09 MED ORDER — POLYVINYL ALCOHOL 1.4 % OP SOLN
1.0000 [drp] | Freq: Every day | OPHTHALMIC | Status: DC
  Administered 2024-01-09 – 2024-01-13 (×5): 1 [drp] via OPHTHALMIC
  Filled 2024-01-09: qty 15

## 2024-01-09 MED ORDER — SODIUM CHLORIDE 0.9 % IV SOLN: INTRAVENOUS | Status: DC

## 2024-01-09 MED ORDER — FENTANYL CITRATE PF 50 MCG/ML IJ SOSY
25.0000 ug | PREFILLED_SYRINGE | Freq: Once | INTRAMUSCULAR | Status: AC
  Administered 2024-01-09: 25 ug via INTRAVENOUS
  Filled 2024-01-09: qty 1

## 2024-01-09 MED ORDER — MIDAZOLAM HCL 5 MG/5ML IJ SOLN
INTRAMUSCULAR | Status: DC | PRN
  Administered 2024-01-09 (×3): 1 mg via INTRAVENOUS

## 2024-01-09 MED ORDER — PRAVASTATIN SODIUM 40 MG PO TABS
40.0000 mg | ORAL_TABLET | Freq: Every evening | ORAL | Status: DC
  Administered 2024-01-09 – 2024-01-13 (×5): 40 mg via ORAL
  Filled 2024-01-09 (×5): qty 1

## 2024-01-09 MED ORDER — LEVOTHYROXINE SODIUM 100 MCG PO TABS
100.0000 ug | ORAL_TABLET | Freq: Every day | ORAL | Status: DC
  Administered 2024-01-09 – 2024-01-14 (×6): 100 ug via ORAL
  Filled 2024-01-09 (×6): qty 1

## 2024-01-09 MED ORDER — LIDOCAINE HCL (PF) 1 % IJ SOLN
INTRAMUSCULAR | Status: DC | PRN
  Administered 2024-01-09: 60 mL

## 2024-01-09 MED ORDER — METOPROLOL TARTRATE 25 MG PO TABS
50.0000 mg | ORAL_TABLET | Freq: Two times a day (BID) | ORAL | Status: DC
  Administered 2024-01-09: 50 mg via ORAL
  Filled 2024-01-09: qty 2

## 2024-01-09 MED ORDER — HEPARIN (PORCINE) 25000 UT/250ML-% IV SOLN: 950.0000 [IU]/h | INTRAVENOUS | Status: DC

## 2024-01-09 MED ORDER — SODIUM CHLORIDE 0.9 % IV SOLN
INTRAVENOUS | Status: AC
  Filled 2024-01-09: qty 2

## 2024-01-09 MED ORDER — LIDOCAINE HCL (PF) 1 % IJ SOLN
INTRAMUSCULAR | Status: AC
  Filled 2024-01-09: qty 60

## 2024-01-09 MED ORDER — FENTANYL CITRATE (PF) 100 MCG/2ML IJ SOLN
INTRAMUSCULAR | Status: AC
  Filled 2024-01-09: qty 2

## 2024-01-09 MED ORDER — POTASSIUM CHLORIDE 10 MEQ/100ML IV SOLN
10.0000 meq | INTRAVENOUS | Status: AC
  Administered 2024-01-09 (×3): 10 meq via INTRAVENOUS
  Filled 2024-01-09 (×3): qty 100

## 2024-01-09 SURGICAL SUPPLY — 12 items
CABLE SURGICAL S-101-97-12 (CABLE) ×1 IMPLANT
CATH RIGHTSITE C315HIS02 (CATHETERS) IMPLANT
IPG PACE AZUR XT DR MRI W1DR01 (Pacemaker) IMPLANT
LEAD CAPSURE NOVUS 5076-52CM (Lead) IMPLANT
LEAD SELECT SECURE 3830 383069 (Lead) IMPLANT
PACE AZURE XT DR MRI W1DR01 (Pacemaker) ×1 IMPLANT
PAD DEFIB RADIO PHYSIO CONN (PAD) ×1 IMPLANT
SELECT SECURE 3830 383069 (Lead) ×1 IMPLANT
SHEATH 7FR PRELUDE SNAP 13 (SHEATH) IMPLANT
SLITTER 6232ADJ (MISCELLANEOUS) IMPLANT
TRAY PACEMAKER INSERTION (PACKS) ×1 IMPLANT
WIRE HI TORQ VERSACORE-J 145CM (WIRE) IMPLANT

## 2024-01-09 NOTE — ED Notes (Signed)
 Pt c/o CP/pressure 8/10. 2 SL nitroglycerin given, with CP relieved to 6/10.

## 2024-01-09 NOTE — ED Notes (Signed)
 Message sent to Dayna Barker, MD for request for tylenol at this time.

## 2024-01-09 NOTE — Hospital Course (Addendum)
 89 y.o. female with history of multiple myeloma, status post TAVR, paroxysmal atrial fibrillation, diastolic CHF, possible mild coronary angio spasm per cardiac cath in 2014, history of breast cancer status post lumpectomy in 2000, history of embolic CVA was brought to the ER after patient had a syncopal episode, described as -while changing her gown sudden loss of balance and fell onto the floor hitting her head, may have lost her consciousness. In the ED workup unremarkable with CT head, C-spine and x-ray of the pelvis not showing anything acute.  Chest x-ray unremarkable EKG shows normal sinus rhythm.  No complaint of chest pain EKG sinus rhythm.  Troponin elevated :108>  102, was started on heparin infusion, Cardiology consulted and admitted for syncope and chest pain for evaluation. 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  Patient having ongoing chest pain but reproduced with palpation and has bruise on the sternal area suspected to be musculoskeletal pain due to her fall and also has elevated troponin postprocedure.  D-dimer were checked given patient's need for supplemental oxygen and unremarkable, suspect oxygen is due to patient's chest discomfort preventing lung expansion. Patient deconditioned and weak PT OT recommending skilled nursing facility Cardiology reevaluated 01/13/24-no further ischemic workup needed continue pain management for musculoskeletal chest pain.  Plan for discharge to SNF.  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 Continue post pacemaker  precaution-follow-up outpatient.   Atypical Chest pain Elevated troponin:? Demand ischemia VS nstemi-likely from hypertension Emeregency Hx of Severe LVH History of TAVR: Normal coronaries in 2019, troponin elevated and trended up and felt to be post pacemaker troponin bump. Has ongoing intermittent chest pain reproducible with palpation.D-dimer was negative. Cont pain control w/ tylenol, lidocaine patch and oxy for severe pain Re-evaluated by cardiology recommending no further ischemic workup and continue pain management for musculoskeletal chest pain.  Echo stable. CT chest no acute findings. Patient reassured.   Acute respiratory insufficiency SpO2 81% on room air placed on 2 L [place]. Resolved . On RA.   Chronic diastolic CHF: Euvolemic. Echo stable Monitor volume status Hypokalemia: Resolved   Hypothyroidism: Cont home Synthroid.   Multiple myeloma: on Revlimid followed by oncologist.   Paroxysmal atrial fibrillation: Continue amiodarone. Eliquis on hold post PM -to resume 01/15/24 per EP cardiology   Chronic kidney stage II: creatinine stable.  Monitor intermittently   Hypertension: Hypertensive emergency on admit: BP poorly controlled-meds adjusted 3/21 on losartan 50, Toprol 50.  Currently stable   Prior history of CVA HLD: Cont  statins and holding Eliquis post PPM-resume on 3/25

## 2024-01-09 NOTE — Progress Notes (Deleted)
 ANTICOAGULATION CONSULT NOTE  Pharmacy Consult for Heparin Indication: chest pain/ACS  Allergies  Allergen Reactions   Quinolones Other (See Comments)    Patient on Amiodarone and can prolong QT   Penicillins Other (See Comments)    UNSPECIFIED REACTION  Patient does not remember reaction.  Has patient had a PCN reaction causing immediate rash, facial/tongue/throat swelling, SOB or lightheadedness with hypotension: no Has patient had a PCN reaction causing severe rash involving mucus membranes or skin necrosis: no Has patient had a PCN reaction that required hospitalization no Has patient had a PCN reaction occurring within the last 10 years: no If all of the above answers are "NO", then may proceed with Cephalosporin use.    Sulfa Antibiotics Other (See Comments)    UNSPECIFIED REACTION  "maybe vision issues? "    Patient Measurements: Height: 5\' 3"  (160 cm) Weight: 74.8 kg (165 lb) IBW/kg (Calculated) : 52.4 Heparin Dosing Weight: 68.3 kg  Vital Signs: Temp: 97.8 F (36.6 C) (03/19 0818) Temp Source: Oral (03/19 0818) BP: 178/67 (03/19 0900) Pulse Rate: 69 (03/19 0900)  Labs: Recent Labs    01/08/24 1354 01/08/24 1413 01/08/24 1616 01/08/24 1815 01/08/24 1820 01/09/24 0218 01/09/24 0407 01/09/24 0649  HGB 12.7 13.3  --   --   --   --  11.7*  --   HCT 38.8 39.0  --   --   --   --  36.4  --   PLT 102*  --   --   --   --   --  124*  --   APTT  --   --   --  34  --   --   --  64*  LABPROT 16.4*  --   --   --   --   --   --   --   INR 1.3*  --   --   --   --   --   --   --   HEPARINUNFRC  --   --   --   --  >1.10*  --   --  >1.10*  CREATININE 1.02* 1.00  --   --   --   --  0.89  --   TROPONINIHS  --   --    < > 102*  --  81* 74*  --    < > = values in this interval not displayed.    Estimated Creatinine Clearance: 42.4 mL/min (by C-G formula based on SCr of 0.89 mg/dL).   Medical History: Past Medical History:  Diagnosis Date   Arthritis    some - per  patient   Breast cancer (HCC)    breast cancer / left    Cataract    bilat    GERD (gastroesophageal reflux disease)    History of kidney stones    Hyperlipidemia    Hypertension    Hypothyroidism    Macular degeneration    Left   S/P TAVR (transcatheter aortic valve replacement) 09/03/2018   23 mm Edwards Sapien 3 transcatheter heart valve placed via percutaneous right transfemoral approach    Severe aortic stenosis    Stress incontinence    Thyroid disease    Tinnitus     Medications:  (Not in a hospital admission)  Scheduled:   acetaminophen (TYLENOL) oral liquid 160 mg/5 mL  650 mg Oral Once   acyclovir  400 mg Oral BID   lenalidomide  10 mg Oral Daily   levothyroxine  100 mcg Oral QAC breakfast   losartan  25 mg Oral Daily   pantoprazole  40 mg Oral Q0600   pravastatin  40 mg Oral QPM   Infusions:   sodium chloride     PRN: nitroGLYCERIN  Assessment: 21 yof with a history of AF on eliquis, AVR, HF, HTN, DM. Patient is presenting with dizzy. Heparin per pharmacy consult placed for chest pain/ACS.  Patient is on apixaban prior to arrival. Last dose 3/18 ~1100 per patient. Will require aPTT monitoring due to likely falsely high anti-Xa level secondary to DOAC use.  Heparin stopped overnight, but now cardiology has consulted to resume as patient is experiencing chest pain and brief loss of pulses this morning. aPTT (64 seconds) was below goal when collected this morning, will resume at slightly higher rate, no bolus.   Goal of Therapy:  Heparin level 0.3-0.7 units/ml aPTT 66-102 seconds Monitor platelets by anticoagulation protocol: Yes   Plan:  Resume heparin infusion at 950 units/hr  Check aPTT & anti-Xa level in 8 hours and daily while on heparin Continue to monitor via aPTT until levels are correlated Continue to monitor H&H and platelets  Ruben Im, PharmD Clinical Pharmacist 01/09/2024 9:37 AM Please check AMION for all [facility] Pharmacy numbers

## 2024-01-09 NOTE — Consult Note (Signed)
 ELECTROPHYSIOLOGY CONSULT NOTE    Patient ID: Zoe [name] MRN: [id number], DOB/AGE: Jul 24, 1934 89 y.o.  Admit date: 01/08/2024 Date of Consult: 01/09/2024  Primary Physician: Irena Reichmann, DO Primary Cardiologist: Yates Decamp, MD  Electrophysiologist: New   Referring Provider: Dr. Izora Ribas  Patient Profile: Zoe [name] is a 89 y.o. female with a history of paroxysmal AF, severe AS s/p TAVR, HFpEF, multiple myleoma, breast CA s/p lumpectomy (2000), prior embolic CVA who presents for evaluation of syncope who is being seen today for the evaluation of syncope and intermittent CHB at the request of Dr. Izora Ribas.  HPI:  Zoe [name] is a 89 y.o. female with complicated history as above.   Pt presented to [facility] yesterday with chest discomfort and syncope. Initial ED evaluation with unremarkable CT head/C-spine.  She was asymptomatic on arrival.  EKG without any acute ischemic changes. hsT mildly elevated from prior baseline (108->102->81) with prior baseline (60-80s).   Pt had recurrence syncope this am with CHB on monitor. Given h/o atrial fibrillation requiring cardioversions and AAD, as well as baseline conduction disease s/p TAVR, EP was consulted and pacing is recommended.   Currently pt is asymptomatic. Mild chest discomfort but thinks it is irritation from having pads on.  Denies exertional chest pain. Had not had prior syncope before yesterday. Had at least 2 episodes while seated for a hair appointment, and another trying to shower/get dressed.   Heparin ran from 2300 to 0733.   Labs Potassium3.2* (03/19 0407)   Creatinine, ser  0.89 (03/19 0407) PLT  124* (03/19 0407) HGB  11.7* (03/19 0407) WBC 5.8 (03/19 0407) Troponin I (High Sensitivity)74* (03/19 0407).    Allergies, Medical, Surgical, Social, and Family Histories have been reviewed and are referenced here-in when relevant for medical decision making.    Physical Exam: Vitals:    01/09/24 0821 01/09/24 0830 01/09/24 0835 01/09/24 0900  BP: (!) 166/73 (!) 117/57 (!) 210/81 (!) 178/67  Pulse:  79 72 69  Resp:  (!) 22 (!) 23 18  Temp:      TempSrc:      SpO2:  99% 100% 100%  Weight:      Height:        GEN- NAD, A&O x 3, normal affect HEENT: Normocephalic, atraumatic Lungs- CTAB, Normal effort.  Heart- Regular rate and rhythm, No M/G/R.  GI- Soft, NT, ND.  Extremities- No clubbing, cyanosis, or edema   Radiology/Studies:   CT head/C spine No acute process  Echo 04/16/23 LVEF 60-65%, grade 2 DD, normal RV, mod LAE, Aortic valve (TAVR) stable  Cath 07/2018 Normal coronaries  EKG:1st degree AV block with sinus bradycardia at 59 bpm  (personally reviewed)  TELEMETRY: SB/NSR 50-60s currently, episode of CHB with eventual escape noted this am -> syncope associated (personally reviewed)  Assessment/Plan:  Intermittent CHB Syncope First degree AV block On BB and amiodarone but with h/o PAF requiring cardioversion as recently as 03/2023 Explained risks, benefits, and alternatives to PPM implantation, including but not limited to bleeding, infection, pneumothorax, pericardial effusion, lead dislodgement, heart attack, stroke, or death.  Pt verbalized understanding and agrees to proceed.    OAC held (Last dose yesterday am)  Severe AS s/p TAVR Stable echo 03/2023  Mild HS troponin Likely in setting of intermittent CHB Had normal coronaries 2019 and stable Echo 03/2023  Dr. Ladona Ridgel has seen. Plan pacing today pending lab availability.   For questions or updates, please contact [facility] Please consult [email or web address] for contact  info under Cardiology/STEMI.  Dustin Flock, PA-C  01/09/2024 9:14 AM

## 2024-01-09 NOTE — Progress Notes (Signed)
 Echocardiogram 2D Echocardiogram has been performed.  Zoe [name] 01/09/2024, 2:51 PM

## 2024-01-09 NOTE — Progress Notes (Addendum)
 PROGRESS NOTE ELIOT BENCIVENGA  QMV:[id number] DOB: 1934/01/07 DOA: 01/08/2024 PCP: Irena Reichmann, DO  Brief Narrative/Hospital Course: 89 y.o. female with history of multiple myeloma, status post TAVR, paroxysmal atrial fibrillation, diastolic CHF, possible mild coronary angio spasm per cardiac cath in 2014, history of breast cancer status post lumpectomy in 2000, history of embolic CVA was brought to the ER after patient had a syncopal episode, described as -while changing her gown sudden loss of balance and fell onto the floor hitting her head, may have lost her consciousness. In the ED workup unremarkable with CT head, C-spine and x-ray of the pelvis not showing anything acute.  Chest x-ray unremarkable EKG shows normal sinus rhythm.  No complaint of chest pain EKG sinus rhythm.  Troponin elevated :108>  102, was started on heparin infusion, Cardiology consulted and admitted for syncope and chest pain for evaluation     Subjective: Seen and examined overnight afebrile BP fairly okay not hypoxic Labs with mild hypokalemia 3.2 trop down 102>81>74. TSH 0.8 Had chest pain this am none now   Assessment and Plan: Principal Problem:   Syncope Active Problems:   Chest pain   Elevated troponin   Chronic diastolic CHF (congestive heart failure) (HCC)   Paroxysmal atrial fibrillation (HCC)   Hypertension   S/P TAVR (transcatheter aortic valve replacement)   Hyperlipidemia   Multiple myeloma (HCC)   Hypothyroidism   History of breast cancer, DCIS, lumpectomy March 2000   GERD (gastroesophageal reflux disease)   Paroxysmal atrial fibrillation with rapid ventricular response (HCC)   Vasospastic angina (HCC)   History of coronary vasospasm   Chronic anticoagulation   CKD (chronic kidney disease) stage 3, GFR 30-59 ml/min (HCC)   Heart block AV complete (HCC)   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  Demand ischemia VS nstemi-likely from hypotension Emeregency Hx of Severe LVH History of TAVR: Normal coronaries in 2019, troponin elevated flat downtrending, cardiology consulted patient admitted on heparin drip> echocardiogram pending.  Continue pain management, cont plan including blood pressure management as per cardio  Chronic diastolic CHF: Compensated.  Repeat echo pending.Last 2D echo done in June 2024 showed EF of 60 to 65% with grade 2 diastolic dysfunction.  Hypokalemia: Potassium 3.2, will replace IV since npo  Hypothyroidism: Continue her Synthroid.  Multiple myeloma on Revlimid followed by oncologist.  Paroxysmal atrial fibrillation: on amiodarone and Eliquis currently holding per cardiology.    Chronic kidney stage II: creatinine at around baseline.  Hypertension: Borderline controlled continue losartan holding beta-blockers.  Prior history of CVA HLD: Cont  statins and holding Eliquis.  DVT prophylaxis:  Code Status:   Code Status: Full Code Family Communication: plan of care discussed with patient at bedside. Patient status is: Remains hospitalized because of severity of illness Level of care: Telemetry Cardiac   Dispo: The patient is from: HOME.            Anticipated disposition: TBD Objective: Vitals last 24 hrs: Vitals:   01/09/24 0821 01/09/24 0830 01/09/24 0835 01/09/24 0900  BP: (!) 166/73 (!) 117/57 (!) 210/81 (!) 178/67  Pulse:  79 72 69  Resp:  (!) 22 (!) 23 18  Temp:      TempSrc:      SpO2:  99% 100% 100%  Weight:      Height:       Weight change:   Physical Examination: General exam: alert awake,at  baseline, older than stated age HEENT:Oral mucosa moist, Ear/Nose WNL grossly Respiratory system: Bilaterally clear BS,no use of accessory muscle Cardiovascular system: S1 & S2 +, No JVD. Gastrointestinal system: Abdomen soft,NT,ND, BS+ Nervous System: Alert, awake, moving all extremities,and following  commands. Extremities: LE edema neg,distal peripheral pulses palpable and warm.  Skin: No rashes,no icterus. MSK: Normal muscle bulk,tone, power   Medications reviewed:  Scheduled Meds:  acetaminophen (TYLENOL) oral liquid 160 mg/5 mL  650 mg Oral Once   acyclovir  400 mg Oral BID   chlorhexidine  60 mL Topical Once   chlorhexidine  60 mL Topical Once   gentamicin (GARAMYCIN) 80 mg in sodium chloride 0.9 % 500 mL irrigation  80 mg Irrigation On Call   lenalidomide  10 mg Oral Daily   levothyroxine  100 mcg Oral QAC breakfast   losartan  25 mg Oral Daily   pantoprazole  40 mg Oral Q0600   pravastatin  40 mg Oral QPM   sodium chloride flush  3-10 mL Intravenous Q12H   sodium chloride flush  3-10 mL Intravenous Q12H   Continuous Infusions:  sodium chloride 50 mL/hr at 01/09/24 1017    ceFAZolin (ANCEF) IV      Diet Order             Diet NPO time specified  Diet effective now                  Intake/Output Summary (Last 24 hours) at 01/09/2024 1052 Last data filed at 01/08/2024 1517 Gross per 24 hour  Intake 1000 ml  Output --  Net 1000 ml   Net IO Since Admission: 1,000 mL [01/09/24 1052]  Wt Readings from Last 3 Encounters:  01/08/24 74.8 kg  01/01/24 74.6 kg  11/02/23 73.7 kg     Unresulted Labs (From admission, onward)     Start     Ordered   01/09/24 1003  Surgical PCR screen  (Screening)  Once,   R        01/09/24 1002          Data Reviewed: I have personally reviewed following labs and imaging studies CBC: Recent Labs  Lab 01/08/24 1354 01/08/24 1413 01/09/24 0407  WBC 7.1  --  5.8  HGB 12.7 13.3 11.7*  HCT 38.8 39.0 36.4  MCV 89.4  --  90.3  PLT 102*  --  124*   Basic Metabolic Panel:  Recent Labs  Lab 01/08/24 1354 01/08/24 1413 01/09/24 0407  NA 138 137 139  K 3.8 3.6 3.2*  CL 99 98 106  CO2 28  --  27  GLUCOSE 130* 124* 91  BUN 19 20 18   CREATININE 1.02* 1.00 0.89  CALCIUM 8.8*  --  8.0*   GFR: Estimated Creatinine  Clearance: 42.4 mL/min (by C-G formula based on SCr of 0.89 mg/dL). Liver Function Tests:  Recent Labs  Lab 01/08/24 1354  AST 22  ALT 15  ALKPHOS 70  BILITOT 0.7  PROT 5.8*  ALBUMIN 3.2*  No results for input(s): "LIPASE", "AMYLASE" in the last 168 hours. No results for input(s): "AMMONIA" in the last 168 hours. Coagulation Profile:  Recent Labs  Lab 01/08/24 1354  INR 1.3*   Recent Labs    01/09/24 0218  TSH 0.891   Sepsis Labs: Recent Labs  Lab 01/08/24 1409 01/08/24 1629  LATICACIDVEN 2.2* 1.3   No results found for this or any previous visit (from the past 240 hours).  Antimicrobials/Microbiology: Anti-infectives (From  admission, onward)    Start     Dose/Rate Route Frequency Ordered Stop   01/09/24 1015  gentamicin (GARAMYCIN) 80 mg in sodium chloride 0.9 % 500 mL irrigation        80 mg Irrigation On call 01/09/24 1002 01/10/24 1015   01/09/24 1015  ceFAZolin (ANCEF) IVPB 2g/100 mL premix        2 g 200 mL/hr over 30 Minutes Intravenous On call 01/09/24 1002 01/10/24 1015   01/09/24 1000  acyclovir (ZOVIRAX) tablet 400 mg        400 mg Oral 2 times daily 01/09/24 0043           Component Value Date/Time   SDES  08/17/2023 1000    URINE, CLEAN CATCH Performed at [facility], [address] [place], [place] [address]    SPECREQUEST  08/17/2023 1000    URINE, CLEAN CATCH Performed at [facility], [address], [place], [place] [address]    CULT (A) 08/17/2023 1000    <10,000 COLONIES/mL INSIGNIFICANT GROWTH Performed at [facility], [address], [place], [place] [address]    REPTSTATUS 08/18/2023 FINAL 08/17/2023 1000     Radiology Studies: CT CERVICAL SPINE WO CONTRAST Result Date: 01/08/2024 CLINICAL DATA:  Larey Seat, hit head EXAM: CT CERVICAL SPINE WITHOUT CONTRAST TECHNIQUE: Multidetector CT imaging of the cervical spine was performed without intravenous contrast. Multiplanar CT image  reconstructions were also generated. RADIATION DOSE REDUCTION: This exam was performed according to the departmental dose-optimization program which includes automated exposure control, adjustment of the mA and/or kV according to patient size and/or use of iterative reconstruction technique. COMPARISON:  02/19/2021 FINDINGS: Alignment: Alignment is grossly anatomic. Skull base and vertebrae: No acute fracture. No primary bone lesion or focal pathologic process. Soft tissues and spinal canal: No prevertebral fluid or swelling. No visible canal hematoma. Disc levels: Partial bony fusion across the C3-4 and C4-5 levels. Stable hypertrophic changes at C1-C2. Multilevel facet hypertrophy most pronounced at C5-6 and C6-7. Upper chest: Airway is patent.  Lung apices are clear. Other: Reconstructed images demonstrate no additional findings. IMPRESSION: 1. No acute cervical spine fracture. 2. Stable multilevel cervical degenerative changes. Electronically Signed   By: Sharlet Salina M.D.   On: 01/08/2024 16:21   CT HEAD WO CONTRAST Result Date: 01/08/2024 CLINICAL DATA:  Larey Seat, hit head, anticoagulated EXAM: CT HEAD WITHOUT CONTRAST TECHNIQUE: Contiguous axial images were obtained from the base of the skull through the vertex without intravenous contrast. RADIATION DOSE REDUCTION: This exam was performed according to the departmental dose-optimization program which includes automated exposure control, adjustment of the mA and/or kV according to patient size and/or use of iterative reconstruction technique. COMPARISON:  09/05/2023 FINDINGS: Brain: Hypodensities within the periventricular white matter are again noted, compatible with chronic small vessel ischemic changes. No evidence of acute infarct or hemorrhage. The lateral ventricles and midline structures are unremarkable. No acute extra-axial fluid collections. No mass effect. Vascular: No hyperdense vessel or unexpected calcification. Skull: Normal. Negative for  fracture or focal lesion. Sinuses/Orbits: Postsurgical changes from prior medial wall antrectomies and bilateral ethmoidectomies. Minimal retained secretions within the maxillary and sphenoid sinuses. Other: None. IMPRESSION: 1. No acute intracranial process. 2. Stable chronic small-vessel ischemic changes within the white matter. Electronically Signed   By: Sharlet Salina M.D.   On: 01/08/2024 16:18   DG Pelvis Portable Result Date: 01/08/2024 CLINICAL DATA:  Larey Seat EXAM: PORTABLE PELVIS 1-2 VIEWS COMPARISON:  09/30/2022 FINDINGS:  Supine frontal view of the pelvis includes both hips. No acute displaced fracture. Symmetrical bilateral hip osteoarthritis. The sacroiliac joints are normal. IMPRESSION: 1. No acute displaced fracture. 2. Symmetrical bilateral hip osteoarthritis. Electronically Signed   By: Sharlet Salina M.D.   On: 01/08/2024 16:16   DG Chest Port 1 View Result Date: 01/08/2024 CLINICAL DATA:  Larey Seat, atrial fibrillation EXAM: PORTABLE CHEST 1 VIEW COMPARISON:  10/01/2023 FINDINGS: Single frontal view of the chest demonstrates a stable cardiac silhouette. Stable aortic valve prosthesis. No acute airspace disease, effusion, or pneumothorax. No acute bony abnormalities. IMPRESSION: 1. No acute intrathoracic process. Electronically Signed   By: Sharlet Salina M.D.   On: 01/08/2024 16:16   LOS: 0 days  Total time spent in review of labs and imaging, patient evaluation, formulation of plan, documentation and communication with family: 50 minutes  Lanae Boast, MD  [facility]  01/09/2024, 10:52 AM

## 2024-01-09 NOTE — Progress Notes (Signed)
 Paged regarding 9/10 chest discomfort. Assessed at bedside HTN sBP 180s. Reports chest discomfort inferior/medial to device site. CXR reviewed and leads in stable RA/RV position. ECG (18:44:40) reviewed without ischemic changes. hsT (765) from (74) at 04:07, difficult to interpret post device placement.  I think the most likely cause of her pain was postprocedural however we did discuss that she could have an NSTEMI although I think this is less likely based off her more positional pain.  Either way postprocedure would not start heparin gtt with concern for pocket hematoma unless refractory CP requiring dx cath and/or significant ST changes. For now will tx with tylenol+oxy+fentanyl and try to get HTN better under control. Started toprol XL 50 mg daily (was on metop tartrate 50 mg bid as OP for AF management). Will trend hsT if significant worsening in sx despite better pain regimen.

## 2024-01-09 NOTE — ED Notes (Signed)
 Heparin gtt stopped at this time. Pt with multiple questions regarding her vital signs. All questions answered, pt verbalized understanding.

## 2024-01-09 NOTE — ED Notes (Signed)
 Cardiology at bedside.

## 2024-01-09 NOTE — Plan of Care (Signed)
  Problem: Education: Goal: Knowledge of cardiac device and self-care will improve Outcome: Progressing   Problem: Cardiac: Goal: Ability to achieve and maintain adequate cardiopulmonary perfusion will improve Outcome: Progressing   Problem: Education: Goal: Knowledge of General Education information will improve Description: Including pain rating scale, medication(s)/side effects and non-pharmacologic comfort measures Outcome: Progressing   Problem: Coping: Goal: Level of anxiety will decrease Outcome: Not Progressing

## 2024-01-09 NOTE — Consult Note (Signed)
 Cardiology Consultation:   Patient ID: Zoe [name] MRN: [id number]; DOB: May 31, 1934  Admit date: 01/08/2024 Date of Consult: 01/09/2024  Primary Care Provider: Irena Reichmann, DO [facility] Cardiologist: Yates Decamp, MD    Patient Profile:   Zoe [name] is a 89 y.o. female with paroxysmal AF, severe AS s/p TAVR, HFpEF, multiple myleoma, breast CA s/p lumpectomy (2000), prior embolic CVA who presents for evaluation of syncope. Cardiology is consulted for elevated hsT.   History of Present Illness:   [name] had sudden loss of balance while changing her gown causing her to trip and hit her head on the ground.  She may have had brief LOC.  Later in the day she was getting her hair dyed and had sudden LOC with subsequent and some mild substernal chest discomfort.  Initial ED evaluation with unremarkable CT head/C-spine.  She was asymptomatic on arrival.  EKG without any acute ischemic changes. hsT mildly elevated from prior baseline (108->102->81) with prior baseline (60-80s).   This AM (836) found to have CHB with WC escape with syncope;  89 year old female with heart block who presents with syncope. Her care was reviewed with her daughter-in-law Lawson Fiscal (virtually)  She experienced three episodes of syncope, the first occurring while getting her hair dyed, and the second while getting dressed, which led to her current admission. She remains fairly active despite these episodes. Her past medical history includes a baseline of first-degree heart block and status post TAVR. She is currently on amiodarone and metoprolol, which are being held. She also has baseline balance issues, which might contribute to her dizziness.  She reports mild chest discomfort, not characterized as chest pain or pressure, with one episode prior to arrival and another episode rated as 8 out of 10 in severity, associated with a blood pressure of 210. This pain improved with nitroglycerin administration.  Her past medical history includes a cardiac CT in 2019 for evaluation of TAVR, which showed minimal non-destructive coronary artery disease.  She also reports abdominal discomfort, specifically suprapubic discomfort rated as 2 out of 10, which does not radiate and is not associated with urination. No fevers, chills, night sweats, or other infectious symptoms.   Past Medical History:  Diagnosis Date   Arthritis    some - per patient   Breast cancer (HCC)    breast cancer / left    Cataract    bilat    GERD (gastroesophageal reflux disease)    History of kidney stones    Hyperlipidemia    Hypertension    Hypothyroidism    Macular degeneration    Left   S/P TAVR (transcatheter aortic valve replacement) 09/03/2018   23 mm Edwards Sapien 3 transcatheter heart valve placed via percutaneous right transfemoral approach    Severe aortic stenosis    Stress incontinence    Thyroid disease    Tinnitus    Past Surgical History:  Procedure Laterality Date   ABDOMINAL HYSTERECTOMY  1970's   BACK SURGERY     BREAST LUMPECTOMY  12/1998   lumpectomy   CARDIAC CATHETERIZATION     CARDIOVERSION N/A 04/18/2023   Procedure: CARDIOVERSION;  Surgeon: Tessa Lerner, DO;  Location: [facility] INVASIVE CV LAB;  Service: Cardiovascular;  Laterality: N/A;   CARDIOVERSION N/A 04/20/2023   Procedure: CARDIOVERSION;  Surgeon: Yates Decamp, MD;  Location: [facility] INVASIVE CV LAB;  Service: Cardiovascular;  Laterality: N/A;   EYE SURGERY     cataract surgery bilat    INTRAOPERATIVE TRANSTHORACIC  ECHOCARDIOGRAM N/A 09/03/2018   Procedure: INTRAOPERATIVE TRANSTHORACIC ECHOCARDIOGRAM;  Surgeon: Kathleene Hazel, MD;  Location: [facility] OR;  Service: Open Heart Surgery;  Laterality: N/A;   KYPHOPLASTY N/A 09/07/2022   Procedure: THORACIC EIGHT KYPHOPLASTY;  Surgeon: Venita Lick, MD;  Location: [facility] OR;  Service: Orthopedics;  Laterality: N/A;  1 hr Local with [facility] 3 C-Bed   LITHOTRIPSY     Right total knee     2018  Dr. Charlann Boxer   RIGHT/LEFT HEART CATH AND CORONARY ANGIOGRAPHY N/A 08/06/2018   Procedure: RIGHT/LEFT HEART CATH AND CORONARY ANGIOGRAPHY;  Surgeon: Yates Decamp, MD;  Location: [facility] INVASIVE CV LAB;  Service: Cardiovascular;  Laterality: N/A;   THYROIDECTOMY, PARTIAL  1975   TONSILLECTOMY     as a child - patient not sure of exact date   TOTAL KNEE ARTHROPLASTY Left 03/13/2016   Procedure: TOTAL KNEE ARTHROPLASTY;  Surgeon: Durene Romans, MD;  Location: [facility] ORS;  Service: Orthopedics;  Laterality: Left;   TOTAL KNEE ARTHROPLASTY Right 06/18/2017   Procedure: RIGHT TOTAL KNEE ARTHROPLASTY;  Surgeon: Durene Romans, MD;  Location: [facility] ORS;  Service: Orthopedics;  Laterality: Right;   TRANSCATHETER AORTIC VALVE REPLACEMENT, TRANSFEMORAL N/A 09/03/2018   Procedure: TRANSCATHETER AORTIC VALVE REPLACEMENT, TRANSFEMORAL;  Surgeon: Kathleene Hazel, MD;  Location: [facility] OR;  Service: Open Heart Surgery;  Laterality: N/A;    Home Medications:  Prior to Admission medications   Medication Sig Start Date End Date Taking? Authorizing Provider  acetaminophen (TYLENOL) 500 MG tablet Take 500 mg by mouth every morning.   Yes [provider]  acyclovir (ZOVIRAX) 400 MG tablet Take 1 tablet (400 mg total) by mouth 2 (two) times daily. 12/17/23  Yes Jaci Standard, MD  amiodarone (PACERONE) 200 MG tablet Take 0.5 tablets (100 mg total) by mouth daily. 11/26/23  Yes Yates Decamp, MD  apixaban (ELIQUIS) 5 MG TABS tablet Take 1 tablet (5 mg total) by mouth 2 (two) times daily. 11/15/23  Yes Yates Decamp, MD  Artificial Tear Solution (SOOTHE XP) SOLN Place 1 drop into both eyes every evening.   Yes [provider]  Cholecalciferol (VITAMIN D3) 50 MCG (2000 UT) capsule Take 1 capsule (2,000 Units total) by mouth daily. 09/11/22  Yes Jaci Standard, MD  [employer] VITAMIN B12 1000 MCG tablet TAKE 1 TABLET BY MOUTH EVERY DAY 02/15/23  Yes [name] T, PA-C  docusate sodium (COLACE) 100 MG capsule Take 100 mg by mouth  daily as needed for mild constipation.   Yes [provider]  furosemide (LASIX) 20 MG tablet Take 1 tablet (20 mg total) by mouth as needed for edema. 10/23/23 01/21/24 Yes Duke, Roe Rutherford, PA  lenalidomide (REVLIMID) 10 MG capsule Take 1 capsule (10 mg total) by mouth daily. [employer] Auth # [id number]  Date Obtained 12/17/23 Take 1 capsule daily for 21 days then none for 7 days 12/17/23  Yes Jaci Standard, MD  levothyroxine (SYNTHROID, LEVOTHROID) 100 MCG tablet Take 100 mcg by mouth daily before breakfast. 12/29/15  Yes [provider]  losartan (COZAAR) 25 MG tablet Take 1 tablet (25 mg total) by mouth daily. 10/23/23  Yes Duke, Roe Rutherford, PA  metoprolol tartrate (LOPRESSOR) 50 MG tablet TAKE 1 TABLET BY MOUTH TWICE A DAY 09/19/23  Yes Yates Decamp, MD  Multiple Vitamins-Minerals (PRESERVISION AREDS) CAPS Take 1 capsule by mouth 2 (two) times daily.   Yes [provider]  pantoprazole (PROTONIX) 40 MG tablet Take 1 tablet (40 mg  total) by mouth daily at 6 (six) AM. 10/03/23  Yes Rodolph Bong, MD  polyethylene glycol (MIRALAX / GLYCOLAX) 17 g packet Take 17 g by mouth daily as needed for mild constipation.   Yes [provider]  potassium chloride (KLOR-CON) 10 MEQ tablet Take 1 tablet (10 mEq total) by mouth as needed (TAKE WITH LASIX FOR SWELLING). 10/23/23 01/21/24 Yes Duke, Roe Rutherford, PA  pravastatin (PRAVACHOL) 40 MG tablet Take 40 mg by mouth every evening.   Yes [provider]    Inpatient Medications: Scheduled Meds:  acetaminophen (TYLENOL) oral liquid 160 mg/5 mL  650 mg Oral Once   acyclovir  400 mg Oral BID   amiodarone  100 mg Oral Daily   lenalidomide  10 mg Oral Daily   levothyroxine  100 mcg Oral QAC breakfast   losartan  25 mg Oral Daily   metoprolol tartrate  50 mg Oral BID   pantoprazole  40 mg Oral Q0600   pravastatin  40 mg Oral QPM   Continuous Infusions:  heparin 800 Units/hr (01/08/24 2308)   PRN  Meds: nitroGLYCERIN  Allergies:    Allergies  Allergen Reactions   Quinolones Other (See Comments)    Patient on Amiodarone and can prolong QT   Penicillins Other (See Comments)    UNSPECIFIED REACTION  Patient does not remember reaction.  Has patient had a PCN reaction causing immediate rash, facial/tongue/throat swelling, SOB or lightheadedness with hypotension: no Has patient had a PCN reaction causing severe rash involving mucus membranes or skin necrosis: no Has patient had a PCN reaction that required hospitalization no Has patient had a PCN reaction occurring within the last 10 years: no If all of the above answers are "NO", then may proceed with Cephalosporin use.    Sulfa Antibiotics Other (See Comments)    UNSPECIFIED REACTION  "maybe vision issues? 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  Tobacco Use   Smoking status: Never   Smokeless tobacco: Never  Vaping Use   Vaping status: Never Used  Substance and Sexual Activity   Alcohol use: No   Drug use: No   Sexual activity: Not Currently  Other Topics Concern   Not on file  Social History Narrative   Not on file   Social Drivers of Health   Financial Resource Strain: Low Risk  (11/12/2018)   Overall Financial Resource Strain (CARDIA)    Difficulty of Paying Living Expenses: Not very hard  Food Insecurity: No Food Insecurity (07/16/2023)   Hunger Vital Sign    Worried About Running Out of Food in the Last Year: Never true    Ran Out of Food in the Last Year: Never true  Transportation Needs: No Transportation Needs (07/16/2023)   PRAPARE - Administrator, Civil Service (Medical): No    Lack of  Transportation (Non-Medical): No  Physical Activity: Inactive (11/12/2018)   Exercise Vital Sign    Days of Exercise per Week: 0 days    Minutes of Exercise per Session: 0 min  Stress: Stress Concern Present (11/12/2018)   [employer] of Occupational Health - Occupational Stress Questionnaire  Feeling of Stress : To some extent  Social Connections: Not on file  Intimate Partner Violence: Not At Risk (07/16/2023)   Humiliation, Afraid, Rape, and Kick questionnaire    Fear of Current or Ex-Partner: No    Emotionally Abused: No    Physically Abused: No    Sexually Abused: No    Family History:    Family History  Problem Relation Age of Onset   Diabetes Mother    Stroke Mother        Carotid artery disease   Heart disease Father        CAD   Coronary artery disease Father    Diabetes Sister      Physical Exam/Data:   Vitals:   01/08/24 2300 01/09/24 0000 01/09/24 0106 01/09/24 0215  BP: (!) 148/52 (!) 146/45 (!) 155/55   Pulse: 60 (!) 57 67   Resp: 20 (!) 21 20   Temp:    98.2 F (36.8 C)  TempSrc:    Oral  SpO2: 96% 95% 97%   Weight:      Height:        Intake/Output Summary (Last 24 hours) at 01/09/2024 0307 Last data filed at 01/08/2024 1517 Gross per 24 hour  Intake 1000 ml  Output --  Net 1000 ml      01/08/2024    1:50 PM 01/01/2024   11:30 AM 11/02/2023   10:41 AM  Last 3 Weights  Weight (lbs) 165 lb 164 lb 6.4 oz 162 lb 6.4 oz  Weight (kg) 74.844 kg 74.571 kg 73.664 kg     Body mass index is 29.23 kg/m.  General:  Elderly female, robust HEENT: normal Lymph: no adenopathy Neck: no JVD (supine) Endocrine:  No thryomegaly Vascular: No carotid bruits; FA pulses 2+ bilaterally without bruits  Cardiac:  normal S1, S2; RRR; systolic murmur Lungs:  clear to auscultation bilaterally, no wheezing, rhonchi or rales  Abd: soft, nontender, no hepatomegaly, slight pain on femoral palpitation Ext: no edema Musculoskeletal:  No deformities, BUE and BLE  strength normal and equal Skin: warm and dry  Neuro:  CNs 2-12 intact, no focal abnormalities noted Psych:  Normal affect   EKG:  The EKG was personally reviewed (01/08/24, 13:42:13) and demonstrates: SB 59, PR 210, QRS 93, QT/Qtc 487/483; Second EKG did NOT show atrial fibrillation  Telemetry:  Telemetry was personally reviewed and demonstrates:  SR-> CHB with asystolic then [facility] escape  Relevant CV Studies: TTE- Severe CAD  Result date: 04/16/23  1. Left ventricular ejection fraction, by estimation, is 60 to 65%. The  left ventricle has normal function. The left ventricle has no regional  wall motion abnormalities. There is moderate concentric left ventricular  hypertrophy. Left ventricular  diastolic parameters are consistent with Grade II diastolic dysfunction  (pseudonormalization). Elevated left ventricular end-diastolic pressure.   2. Right ventricular systolic function is normal. The right ventricular  size is normal. There is mildly elevated pulmonary artery systolic  pressure. The estimated right ventricular systolic pressure is 41.4 mmHg.   3. Left atrial size was moderately dilated.   4. The mitral valve is normal in structure. Mild mitral valve  regurgitation.   5. The aortic valve has been repaired/replaced. Aortic valve  regurgitation is mild. No aortic stenosis is present. There is a 23 mm 3  THV size 23 mm Sapien prosthetic (TAVR) valve present in the aortic  position. Procedure Date: 09/03/2018.   6. The inferior vena cava is normal in size with <50%  respiratory  variability, suggesting right atrial pressure of 8 mmHg.   CTA: Personally reviewed: PA Dilation and minimal distal CAD Result date: 08/15/18 1. Calcified tri leaflet AV with annular area of 373 mm2 suitable for a 23 mm Sapien 3 valve 2.  Coronary arteries sufficient height above annulus for deployment 3.  Normal aortic  root 2.8 cm 4. Optimum angiographic angle for deployment LAO 25 Caudal 2 degrees 5.   No LAA thrombus  RHC/coronary angiography  Result date: 08/06/18 Normal coronary arteries, right dominant circulation.  Left ventriculogram not performed. Moderate pulmonary hypertension due to elevated LVEDP, WHO group 2. RA 7/7, mean 5 mmHg; RV 49/1, EDP 7 mmHg; PA 54/24, mean 36 mmHg, PA saturation 74%.  PW 21/26, mean 20 mmHg.  Aortic saturation 95%. CO 7.51, CI 4.05 by Fick.  Laboratory Data:  High Sensitivity Troponin:   Recent Labs  Lab 01/08/24 1616 01/08/24 1815 01/09/24 0218  TROPONINIHS 108* 102* 81*     Chemistry Recent Labs  Lab 01/08/24 1354 01/08/24 1413  NA 138 137  K 3.8 3.6  CL 99 98  CO2 28  --   GLUCOSE 130* 124*  BUN 19 20  CREATININE 1.02* 1.00  CALCIUM 8.8*  --   GFRNONAA 53*  --   ANIONGAP 11  --     Recent Labs  Lab 01/08/24 1354  PROT 5.8*  ALBUMIN 3.2*  AST 22  ALT 15  ALKPHOS 70  BILITOT 0.7   Hematology Recent Labs  Lab 01/08/24 1354 01/08/24 1413  WBC 7.1  --   RBC 4.34  --   HGB 12.7 13.3  HCT 38.8 39.0  MCV 89.4  --   MCH 29.3  --   MCHC 32.7  --   RDW 15.4  --   PLT 102*  --    BNPNo results for input(s): "BNP", "PROBNP" in the last 168 hours.  DDimer  Recent Labs  Lab 01/08/24 1354  DDIMER 0.43   Radiology/Studies:  CT CERVICAL SPINE WO CONTRAST Result Date: 01/08/2024 CLINICAL DATA:  Larey Seat, hit head EXAM: CT CERVICAL SPINE WITHOUT CONTRAST TECHNIQUE: Multidetector CT imaging of the cervical spine was performed without intravenous contrast. Multiplanar CT image reconstructions were also generated. RADIATION DOSE REDUCTION: This exam was performed according to the departmental dose-optimization program which includes automated exposure control, adjustment of the mA and/or kV according to patient size and/or use of iterative reconstruction technique. COMPARISON:  02/19/2021 FINDINGS: Alignment: Alignment is grossly anatomic. Skull base and vertebrae: No acute fracture. No primary bone lesion or focal pathologic  process. Soft tissues and spinal canal: No prevertebral fluid or swelling. No visible canal hematoma. Disc levels: Partial bony fusion across the C3-4 and C4-5 levels. Stable hypertrophic changes at C1-C2. Multilevel facet hypertrophy most pronounced at C5-6 and C6-7. Upper chest: Airway is patent.  Lung apices are clear. Other: Reconstructed images demonstrate no additional findings. IMPRESSION: 1. No acute cervical spine fracture. 2. Stable multilevel cervical degenerative changes. Electronically Signed   By: Sharlet Salina M.D.   On: 01/08/2024 16:21   CT HEAD WO CONTRAST Result Date: 01/08/2024 CLINICAL DATA:  Larey Seat, hit head, anticoagulated EXAM: CT HEAD WITHOUT CONTRAST TECHNIQUE: Contiguous axial images were obtained from the base of the skull through the vertex without intravenous contrast. RADIATION DOSE REDUCTION: This exam was performed according to the departmental dose-optimization program which includes automated exposure control, adjustment of the mA and/or kV according to patient size and/or use of iterative reconstruction technique. COMPARISON:  09/05/2023  FINDINGS: Brain: Hypodensities within the periventricular white matter are again noted, compatible with chronic small vessel ischemic changes. No evidence of acute infarct or hemorrhage. The lateral ventricles and midline structures are unremarkable. No acute extra-axial fluid collections. No mass effect. Vascular: No hyperdense vessel or unexpected calcification. Skull: Normal. Negative for fracture or focal lesion. Sinuses/Orbits: Postsurgical changes from prior medial wall antrectomies and bilateral ethmoidectomies. Minimal retained secretions within the maxillary and sphenoid sinuses. Other: None. IMPRESSION: 1. No acute intracranial process. 2. Stable chronic small-vessel ischemic changes within the white matter. Electronically Signed   By: Sharlet Salina M.D.   On: 01/08/2024 16:18   DG Pelvis Portable Result Date: 01/08/2024 CLINICAL  DATA:  Larey Seat EXAM: PORTABLE PELVIS 1-2 VIEWS COMPARISON:  09/30/2022 FINDINGS: Supine frontal view of the pelvis includes both hips. No acute displaced fracture. Symmetrical bilateral hip osteoarthritis. The sacroiliac joints are normal. IMPRESSION: 1. No acute displaced fracture. 2. Symmetrical bilateral hip osteoarthritis. Electronically Signed   By: Sharlet Salina M.D.   On: 01/08/2024 16:16   DG Chest Port 1 View Result Date: 01/08/2024 CLINICAL DATA:  Larey Seat, atrial fibrillation EXAM: PORTABLE CHEST 1 VIEW COMPARISON:  10/01/2023 FINDINGS: Single frontal view of the chest demonstrates a stable cardiac silhouette. Stable aortic valve prosthesis. No acute airspace disease, effusion, or pneumothorax. No acute bony abnormalities. IMPRESSION: 1. No acute intrathoracic process. Electronically Signed   By: Sharlet Salina M.D.   On: 01/08/2024 16:16      TIMI Risk Score for Unstable Angina or Non-ST Elevation MI:   The patient's TIMI risk score is  , which indicates a  % risk of all cause mortality, new or recurrent myocardial infarction or need for urgent revascularization in the next 14 days.   Assessment and Plan:   24F with paroxysmal AF, severe AS s/p TAVR, HFpEF, multiple myleoma, breast CA s/p lumpectomy (2000), prior embolic CVA who presents with syncope.    Syncope associated with heart block [age]-year-old with three episodes of syncope linked to significant heart block and baseline first-degree heart block. Suspected need for pacemaker due to heart block substrate, especially post-TAVR. Polypharmacy considered but not primary cause (she has PAF and may have tachy-brady). Electrophysiology consulted for potential pacemaker placement. Decision to forego heart catheterization in favor of pacemaker placement due to high likelihood of requiring a permanent pacemaker. - Hold amiodarone and metoprolol; hold AC; last eliquis 01/08/24 AM - Consult electrophysiology for pacemaker placement - Keep  NPO   NSTEMI Intermittent chest pain with one episode rated 8/10, associated with elevated blood pressure of 210. Troponin minimally elevated but decreased to under 100. Previous cardiac CT in 2019 showed minimal non-obstructive coronary artery disease. Coronary artery disease needs to be ruled out, but decision made to forego heart catheterization in favor of pacemaker placement. - Order echocardiogram - Hold Eliquis - this may be hypertensive emergency; her troponin is similar to baseline; she will need BP control post PPM if this was not the cause of her elevated BP   Coronary artery disease Minimal non-obstructive coronary artery disease noted in 2019 cardiac CT. Given chest pain and troponin elevation, significant coronary artery disease needs to be ruled out. Decision made to forego heart catheterization due to the need for pacemaker placement. - Order echocardiogram  Hypertension Elevated blood pressure noted during chest pain episode.; discussed as above  Abdominal discomfort Suprapubic discomfort rated 2/10, not associated with urination. No signs of infection such as fever, chills, or night sweats. Considered incidental and not a  UTI.  History of Multiple Myeloma - with severe LVH; needs strain with echo  Aortic Insufficiency (mild) Pulmonary Hypertension  - Echo as above  Riley Lam, MD FASE [facility] Cardiologist [facility]  [address] [place], [place] [address] [phone]  7:58 AM

## 2024-01-09 NOTE — ED Notes (Signed)
 Updated pts son, Kathlene November via phone at this time.

## 2024-01-09 NOTE — Progress Notes (Signed)
 Transition of Care [facility]) - CAGE-AID Screening   Patient Details  Name: Zoe [name] MRN: [id number] Date of Birth: 04-14-1934  Transition of Care (TOC) CM/SW Contact:    Katha Hamming, RN Phone Number: 01/09/2024, 6:18 AM   Clinical Narrative:  No alcohol/drug use per H&P  CAGE-AID Screening:    Have You Ever Felt You Ought to Cut Down on Your Drinking or Drug Use?: No Have People Annoyed You By Critizing Your Drinking Or Drug Use?: No Have You Felt Bad Or Guilty About Your Drinking Or Drug Use?: No Have You Ever Had a Drink or Used Drugs First Thing In The Morning to Steady Your Nerves or to Get Rid of a Hangover?: No CAGE-AID Score: 0  Substance Abuse Education Offered: No

## 2024-01-09 NOTE — Plan of Care (Signed)
  Problem: Clinical Measurements: Goal: Ability to maintain clinical measurements within normal limits will improve Outcome: Progressing Goal: Diagnostic test results will improve Outcome: Progressing   Problem: Nutrition: Goal: Adequate nutrition will be maintained Outcome: Progressing   Problem: Pain Managment: Goal: General experience of comfort will improve and/or be controlled Outcome: Progressing

## 2024-01-09 NOTE — Discharge Instructions (Signed)
 After Your Pacemaker   ACTIVITY Do not lift your arm above shoulder height for 1 week after your procedure. After 7 days, you may progress as below.  You should remove your sling 24 hours after your procedure, unless otherwise instructed by your provider.     Wednesday January 16, 2024  Thursday January 17, 2024 Friday January 18, 2024 Saturday January 19, 2024   Do not lift, push, pull, or carry anything over 10 pounds with the affected arm until 6 weeks (Wednesday February 20, 2024 ) after your procedure.   You may drive AFTER your wound check, unless you have been told otherwise by your provider.   Ask your healthcare provider when you can go back to work   INCISION/Dressing Resume Eliquis Tuesday, 3/25.   If large square, outer bandage is left in place, this can be removed after 24 hours from your procedure. Do not remove steri-strips or glue as below.   If a PRESSURE DRESSING (a bulky dressing that usually goes up over your shoulder) was applied or left in place, please follow instructions given by your provider on when to return to have this removed.   Monitor your Pacemaker site for redness, swelling, and drainage. Call the device clinic at [phone] if you experience these symptoms or fever/chills.  If your incision is sealed with Steri-strips or staples, you may shower 7 days after your procedure or when told by your provider. Do not remove the steri-strips or let the shower hit directly on your site. You may wash around your site with soap and water.    If you were discharged in a sling, please do not wear this during the day more than 48 hours after your surgery unless otherwise instructed. This may increase the risk of stiffness and soreness in your shoulder.   Avoid lotions, ointments, or perfumes over your incision until it is well-healed.  You may use a hot tub or a pool AFTER your wound check appointment if the incision is completely closed.  Pacemaker Alerts:  Some alerts  are vibratory and others beep. These are NOT emergencies. Please call our office to let us know. If this occurs at night or on weekends, it can wait until the next business day. Send a remote transmission.  If your device is capable of reading fluid status (for heart failure), you will be offered monthly monitoring to review this with you.   DEVICE MANAGEMENT Remote monitoring is used to monitor your pacemaker from home. This monitoring is scheduled every 91 days by our office. It allows [place] to keep an eye on the functioning of your device to ensure it is working properly. You will routinely see your Electrophysiologist annually (more often if necessary).   You should receive your ID card for your new device in 4-8 weeks. Keep this card with you at all times once received. Consider wearing a medical alert bracelet or necklace.  Your Pacemaker may be MRI compatible. This will be discussed at your next office visit/wound check.  You should avoid contact with strong electric or magnetic fields.   Do not use amateur (ham) radio equipment or electric (arc) welding torches. MP3 player headphones with magnets should not be used. Some devices are safe to use if held at least 12 inches (30 cm) from your Pacemaker. These include power tools, lawn mowers, and speakers. If you are unsure if something is safe to use, ask your health care provider.  When using your cell phone, hold it to  the ear that is on the opposite side from the Pacemaker. Do not leave your cell phone in a pocket over the Pacemaker.  You may safely use electric blankets, heating pads, computers, and microwave ovens.  Call the office right away if: You have chest pain. You feel more short of breath than you have felt before. You feel more light-headed than you have felt before. Your incision starts to open up.  This information is not intended to replace advice given to you by your health care provider. Make sure you discuss any questions  you have with your health care provider.

## 2024-01-09 NOTE — ED Provider Notes (Signed)
 Patient currently awaiting admission by hospitalist service also being followed by cardiology.  Nurse lost a pulse patient became unconscious CPR initiated I responded.  Patient during CPR started to cry out.  Patient blood pressure afterwards 210 systolic patient complaining of abdominal pain patient's cardiac rhythm once put on the monitor and on the monitor in the bed was more reflective of a sinus rhythm.  Suspect a syncopal episode apparently she was complaining of pain before hand.  Cardiology here to see her now.   Vanetta Mulders, MD 01/09/24 [phone]

## 2024-01-09 NOTE — ED Notes (Addendum)
 0805: pt c/o 8/10 CP, 1 SL nitroglycerin given with no relief of CP.  0822: 2nd SL nitroglycerin given with relief of CP to 6/10.   0826: Pt c/o feeling "dizzy". BP recheck 117/57.   0829: Pt states she feels as though she will pass out. Pt respirations noted to be agonal, pt no longer responsive. HOB lowered, staff called to assist, asystole on the monitor, pulse no longer palpable by X2 staff. Pt bagged, compressions started.   0830: Pt began vocalizing, eyes deviated to Right.   0831: Pt placed on zole, pt A&O x4.

## 2024-01-10 ENCOUNTER — Inpatient Hospital Stay (HOSPITAL_COMMUNITY)

## 2024-01-10 ENCOUNTER — Encounter (HOSPITAL_COMMUNITY): Payer: Self-pay | Admitting: Internal Medicine

## 2024-01-10 ENCOUNTER — Ambulatory Visit: Payer: Medicare Other

## 2024-01-10 DIAGNOSIS — I459 Conduction disorder, unspecified: Secondary | ICD-10-CM | POA: Diagnosis not present

## 2024-01-10 LAB — CBC
HCT: 36.9 % (ref 36.0–46.0)
Hemoglobin: 12 g/dL (ref 12.0–15.0)
MCH: 29.1 pg (ref 26.0–34.0)
MCHC: 32.5 g/dL (ref 30.0–36.0)
MCV: 89.3 fL (ref 80.0–100.0)
Platelets: 104 10*3/uL — ABNORMAL LOW (ref 150–400)
RBC: 4.13 MIL/uL (ref 3.87–5.11)
RDW: 15.9 % — ABNORMAL HIGH (ref 11.5–15.5)
WBC: 6.3 10*3/uL (ref 4.0–10.5)
nRBC: 0 % (ref 0.0–0.2)

## 2024-01-10 LAB — COMPREHENSIVE METABOLIC PANEL
ALT: 14 U/L (ref 0–44)
AST: 21 U/L (ref 15–41)
Albumin: 2.8 g/dL — ABNORMAL LOW (ref 3.5–5.0)
Alkaline Phosphatase: 63 U/L (ref 38–126)
Anion gap: 9 (ref 5–15)
BUN: 16 mg/dL (ref 8–23)
CO2: 25 mmol/L (ref 22–32)
Calcium: 8.2 mg/dL — ABNORMAL LOW (ref 8.9–10.3)
Chloride: 100 mmol/L (ref 98–111)
Creatinine, Ser: 1.01 mg/dL — ABNORMAL HIGH (ref 0.44–1.00)
GFR, Estimated: 54 mL/min — ABNORMAL LOW (ref >60)
Glucose, Bld: 145 mg/dL — ABNORMAL HIGH (ref 70–99)
Potassium: 3.5 mmol/L (ref 3.5–5.1)
Sodium: 134 mmol/L — ABNORMAL LOW (ref 135–145)
Total Bilirubin: 1.1 mg/dL (ref 0.0–1.2)
Total Protein: 5.3 g/dL — ABNORMAL LOW (ref 6.5–8.1)

## 2024-01-10 LAB — D-DIMER, QUANTITATIVE: D-Dimer, Quant: 0.43 ug{FEU}/mL (ref 0.00–0.50)

## 2024-01-10 MED ORDER — AMIODARONE HCL 100 MG PO TABS
100.0000 mg | ORAL_TABLET | Freq: Every day | ORAL | Status: DC
  Administered 2024-01-10 – 2024-01-14 (×5): 100 mg via ORAL
  Filled 2024-01-10 (×5): qty 1

## 2024-01-10 MED FILL — Cefazolin Sodium-Dextrose IV Solution 2 GM/100ML-4%: INTRAVENOUS | Qty: 100 | Status: AC

## 2024-01-10 MED FILL — Midazolam HCl Inj 2 MG/2ML (Base Equivalent): INTRAMUSCULAR | Qty: 1 | Status: AC

## 2024-01-10 MED FILL — Midazolam HCl Inj 2 MG/2ML (Base Equivalent): INTRAMUSCULAR | Qty: 2 | Status: AC

## 2024-01-10 NOTE — Progress Notes (Addendum)
  Patient Name: Zoe [name] Date of Encounter: 01/10/2024  Primary Cardiologist: Yates Decamp, MD Electrophysiologist: Lewayne Bunting, MD  Interval Summary   Doing OK this am.   Had chest discomfort overnight with minimal relief from chest pain. Pain of same quality prior to PPM. On exam, she has bruising and tenderness to palpitation on her lower sternal border. No pleuritic or exertional component.   Vital Signs    Vitals:   01/10/24 0408 01/10/24 0409 01/10/24 0410 01/10/24 0411  BP:      Pulse: 71 71 72 72  Resp: (!) 22 20 (!) 25 (!) 21  Temp:      TempSrc:      SpO2: (!) 86% 92% 91% (!) 87%  Weight:      Height:        Intake/Output Summary (Last 24 hours) at 01/10/2024 0726 Last data filed at 01/09/2024 2210 Gross per 24 hour  Intake 270 ml  Output --  Net 270 ml   Filed Weights   01/08/24 1350 01/10/24 0407  Weight: 74.8 kg 74.1 kg    Physical Exam    GEN- The patient is well appearing, alert and oriented x 3 today.   Lungs- Clear to ausculation bilaterally, normal work of breathing Cardiac- Regular rate and rhythm, no murmurs, rubs or gallops GI- soft, NT, ND, + BS Extremities- no clubbing or cyanosis. No edema  Telemetry    NSR 70s (personally reviewed)  Hospital Course    Zoe [name] is a 89 y.o. female with a history of paroxysmal AF, severe AS s/p TAVR, HFpEF, multiple myleoma, breast CA s/p lumpectomy (2000), prior embolic CVA who presents for evaluation of syncope who is being seen today for the evaluation of syncope and intermittent CHB at the request of Dr. Izora Ribas.   Assessment & Plan    Intermittent CHB Stokes Adam Syncope S/p MDT DDD PPM 3/19 with Dr. Ladona Ridgel CXR looks OK, formal read pending.  Wound care and arm restrictions personally reviewed with pt and placed in AVS Usual follow up in place.   Severe S/p TAVR Normal EF and stable valve on echo 01/09/24 Normal coronaries 2019  Paroxysmal AF Resume amiodarone 100  mg daily Can resume metoprolol as pressures allow.  Resume eliquis Tuesday, 3/25.  Chest discomfort Exquisitely tender to palpation near her xyphoid process with ecchymosis noted.  Thus far cardiac work up has been unremarkable. Mild troponin bump consistent with post PPM.  Suspect more likely this is musculoskeletal from her falls. No fracture noted on CXRs.   Dr. Ladona Ridgel has seen.   For questions or updates, please contact [facility] Please consult [email or web address] for contact info under Cardiology/STEMI.  Signed, Graciella Freer, PA-C  01/10/2024, 7:26 AM   EP Attending  Patient seen and examined. Agree with above. On exam she has chest wall tenderness and eccymosis over her sternum. Lungs are clear and PPM incision demonstrates no hematoma or bleeding. CXR demonstrates normal lead position. Tele with NSR.  A/P Symptomatic tachy-brady syndrome - she is stable s/p PPM insertion.  Coags - she will resume an OAC in 6 days.  3 .Disp- DC home with usual followup.   Sharlot Gowda Ezariah Nace,MD

## 2024-01-10 NOTE — Plan of Care (Signed)
  Problem: Cardiac: Goal: Ability to achieve and maintain adequate cardiopulmonary perfusion will improve Outcome: Progressing   Problem: Clinical Measurements: Goal: Respiratory complications will improve Outcome: Progressing   Problem: Safety: Goal: Ability to remain free from injury will improve Outcome: Progressing   Problem: Coping: Goal: Level of anxiety will decrease Outcome: Not Progressing

## 2024-01-10 NOTE — Progress Notes (Signed)
 PROGRESS NOTE Zoe [name]  WUJ:[id number] DOB: Apr 02, 1934 DOA: 01/08/2024 PCP: Irena Reichmann, DO  Brief Narrative/Hospital Course: 89 y.o. female with history of multiple myeloma, status post TAVR, paroxysmal atrial fibrillation, diastolic CHF, possible mild coronary angio spasm per cardiac cath in 2014, history of breast cancer status post lumpectomy in 2000, history of embolic CVA was brought to the ER after patient had a syncopal episode, described as -while changing her gown sudden loss of balance and fell onto the floor hitting her head, may have lost her consciousness. In the ED workup unremarkable with CT head, C-spine and x-ray of the pelvis not showing anything acute.  Chest x-ray unremarkable EKG shows normal sinus rhythm.  No complaint of chest pain EKG sinus rhythm.  Troponin elevated :108>  102, was started on heparin infusion, Cardiology consulted and admitted for syncope and chest pain for evaluation  Consultation: Cardiology, EP cardiology  Significant procedures/imaging: Tte 3/19> EF 60-65%, G2 DD LA-severely dilated aortic valve repaired/replaced no aortic stenosis Dual-chamber pacemaker placement 3/19     Subjective: Seen and examined Patient complains of chest pain reproducible with palpation lower sternal area where she has a bruise, she had a fall at home Overnight BP stable mild tachypnea and spo2 at 89% on RA and placed on Reardan Labs pending Overnight chest pain and troponin elevated to 76 5> 663-evaluated by cardiology  Assessment and Plan: Principal Problem:   Syncope Active Problems:   Chest pain   Elevated troponin   Chronic diastolic CHF (congestive heart failure) (HCC)   Paroxysmal atrial fibrillation (HCC)   Hypertension   S/P TAVR (transcatheter aortic valve replacement)   Hyperlipidemia   Multiple myeloma (HCC)   Hypothyroidism   History of breast cancer, DCIS, lumpectomy March 2000   GERD (gastroesophageal reflux disease)   Paroxysmal atrial  fibrillation with rapid ventricular response (HCC)   Vasospastic angina (HCC)   History of coronary vasospasm   Chronic anticoagulation   CKD (chronic kidney disease) stage 3, GFR 30-59 ml/min (HCC)   Heart block AV complete (HCC)   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 Demand ischemia VS nstemi-likely from hypertension Emeregency Hx of Severe LVH History of TAVR: Normal coronaries in 2019, troponin elevated and trended up overnight cardiology on board. Echo fairly stable, continue pain management continue plan per cardiology.  Unable to use heparin due to risk of pocket hematoma.  Continue to modify antihypertensive regimen.  Acute respiratory insufficiency SpO2 81% on room air placed on 2 L . Likely from decreased respiratory effort due to chest pain on deep breath or tenderness.  Of note patient had been on Eliquis prior to admission, discussed with EP cardiology team low likelihood of PE.  Chronic diastolic CHF: Compensated.  Repeat echo pending.Last 2D echo done in June 2024 showed EF of 60 to 65% with grade 2 diastolic dysfunction.  Hypokalemia: Potassium 3.2, was replaced, recheck pending   Hypothyroidism: Stable, cont her Synthroid.  Multiple myeloma: on Revlimid followed by oncologist.  Paroxysmal atrial fibrillation: on amiodarone. Eliquis on hold resume postprocedure per cardiology  Chronic kidney stage II: creatinine at around baseline.  Hypertension: Hypertensive emergency: Metoprolol resumed 50 daily, losartan, continue plan per cardiology  Prior history of CVA HLD: Cont  statins and holding Eliquis.  DVT prophylaxis:  Code Status:   Code Status: Full Code Family Communication: plan of care discussed with patient at bedside. Patient status is: Remains hospitalized  because of severity of illness Level of care: Telemetry  Cardiac   Dispo: The patient is from: HOME.            Anticipated disposition: TBD.  Will request PT OT evaluation today Objective: Vitals last 24 hrs: Vitals:   01/10/24 0409 01/10/24 0410 01/10/24 0411 01/10/24 0755  BP:    (!) 151/70  Pulse: 71 72 72   Resp: 20 (!) 25 (!) 21 (!) 21  Temp:    98 F (36.7 C)  TempSrc:    Oral  SpO2: 92% 91% (!) 87%   Weight:      Height:       Weight change: -0.744 kg  Physical Examination: General exam: alert awake, oriented HEENT:Oral mucosa moist, Ear/Nose WNL grossly Respiratory system: Bilaterally clear BS,no use of accessory muscle Cardiovascular system: S1 & S2 +, No JVD.  Bruise on the lower sternal area with tenderness on palpation-describes as similar chest pain, pacemaker site with dressing in place on left upper chest Gastrointestinal system: Abdomen soft,NT,ND, BS+ Nervous System: Alert, awake, moving all extremities,and following commands. Extremities: LE edema neg,distal peripheral pulses palpable and warm.  Skin: No rashes,no icterus. MSK: Normal muscle bulk,tone, power   Medications reviewed:  Scheduled Meds:  acetaminophen  975 mg Oral TID   acyclovir  400 mg Oral BID   amiodarone  100 mg Oral Daily   lenalidomide  10 mg Oral Daily   levothyroxine  100 mcg Oral QAC breakfast   losartan  25 mg Oral Daily   metoprolol succinate  50 mg Oral Daily   pantoprazole  40 mg Oral Q0600   polyvinyl alcohol  1 drop Both Eyes QHS   pravastatin  40 mg Oral QPM   Continuous Infusions:  sodium chloride 50 mL/hr at 01/09/24 1017   vancomycin Stopped (01/09/24 1333)    Diet Order             Diet Heart Room service appropriate? Yes; Fluid consistency: Thin  Diet effective now                  Intake/Output Summary (Last 24 hours) at 01/10/2024 1014 Last data filed at 01/10/2024 0925 Gross per 24 hour  Intake 270 ml  Output 400 ml  Net -130 ml   Net IO Since Admission: 870 mL [01/10/24 1014]  Wt Readings from Last 3  Encounters:  01/10/24 74.1 kg  01/01/24 74.6 kg  11/02/23 73.7 kg     Unresulted Labs (From admission, onward)     Start     Ordered   01/09/24 1003  Surgical PCR screen  (Screening)  Once,   R        01/09/24 1002          Data Reviewed: I have personally reviewed following labs and imaging studies CBC: Recent Labs  Lab 01/08/24 1354 01/08/24 1413 01/09/24 0407 01/10/24 0905  WBC 7.1  --  5.8 6.3  HGB 12.7 13.3 11.7* 12.0  HCT 38.8 39.0 36.4 36.9  MCV 89.4  --  90.3 89.3  PLT 102*  --  124* 104*   Basic Metabolic Panel:  Recent Labs  Lab 01/08/24 1354 01/08/24 1413 01/09/24 0407 01/10/24 0905  NA 138 137 139 134*  K 3.8 3.6 3.2* 3.5  CL 99 98 106 100  CO2 28  --  27 25  GLUCOSE 130* 124* 91 145*  BUN 19 20 18 16   CREATININE 1.02* 1.00 0.89 1.01*  CALCIUM 8.8*  --  8.0* 8.2*   GFR: Estimated Creatinine Clearance: 37.1 mL/min (A) (by C-G formula based on SCr of 1.01 mg/dL (H)). Liver Function Tests:  Recent Labs  Lab 01/08/24 1354 01/10/24 0905  AST 22 21  ALT 15 14  ALKPHOS 70 63  BILITOT 0.7 1.1  PROT 5.8* 5.3*  ALBUMIN 3.2* 2.8*  No results for input(s): "LIPASE", "AMYLASE" in the last 168 hours. No results for input(s): "AMMONIA" in the last 168 hours. Coagulation Profile:  Recent Labs  Lab 01/08/24 1354  INR 1.3*   Recent Labs    01/09/24 0218  TSH 0.891   Sepsis Labs: Recent Labs  Lab 01/08/24 1409 01/08/24 1629  LATICACIDVEN 2.2* 1.3   No results found for this or any previous visit (from the past 240 hours).  Antimicrobials/Microbiology: Anti-infectives (From admission, onward)    Start     Dose/Rate Route Frequency Ordered Stop   01/10/24 0600  vancomycin (VANCOCIN) IVPB 1000 mg/200 mL premix        1,000 mg 200 mL/hr over 60 Minutes Intravenous  Once 01/09/24 1830 01/10/24 0739   01/09/24 1618  vancomycin (VANCOCIN) IVPB 1000 mg/200 mL premix        over 60 Minutes  Continuous PRN 01/09/24 1619 01/09/24 1739   01/09/24  1533  ceFAZolin (ANCEF) 2-4 GM/100ML-% IVPB       Note to Pharmacy: Zadie Cleverly B: cabinet override      01/09/24 1533 01/10/24 0344   01/09/24 1533  sodium chloride 0.9 % with gentamicin (GARAMYCIN) ADS Med       Note to Pharmacy: Zadie Cleverly B: cabinet override      01/09/24 1533 01/10/24 0344   01/09/24 1230  vancomycin (VANCOCIN) IVPB 1000 mg/200 mL premix        1,000 mg 200 mL/hr over 60 Minutes Intravenous On call 01/09/24 1227 01/10/24 1230   01/09/24 1015  gentamicin (GARAMYCIN) 80 mg in sodium chloride 0.9 % 500 mL irrigation        80 mg Irrigation On call 01/09/24 1002 01/09/24 1658   01/09/24 1015  ceFAZolin (ANCEF) IVPB 2g/100 mL premix  Status:  Discontinued        2 g 200 mL/hr over 30 Minutes Intravenous On call 01/09/24 1002 01/09/24 1227   01/09/24 1000  acyclovir (ZOVIRAX) tablet 400 mg        400 mg Oral 2 times daily 01/09/24 0043           Component Value Date/Time   SDES  08/17/2023 1000    URINE, CLEAN CATCH Performed at [facility], [address], [place], [place] [address]    SPECREQUEST  08/17/2023 1000    URINE, CLEAN CATCH Performed at [facility], [address] [place], [place] [address]    CULT (A) 08/17/2023 1000    <10,000 COLONIES/mL INSIGNIFICANT GROWTH Performed at [facility], [address], [place], [place] [address]    REPTSTATUS 08/18/2023 FINAL 08/17/2023 1000     Radiology Studies: DG Chest 2 View Result Date: 01/10/2024 CLINICAL DATA:  Pacemaker placement. EXAM: CHEST - 2 VIEW COMPARISON:  January 09, 2024. FINDINGS: Stable cardiomediastinal silhouette. Left-sided pacemaker is unchanged. Stable left upper lobe opacity is noted concerning for pneumonia or atelectasis. Status post kyphoplasty of lower thoracic vertebral body. Right lung is clear. No pneumothorax is noted. IMPRESSION: Left upper lobe opacity is again noted concerning for pneumonia or atelectasis.  Followup PA and lateral chest X-ray is  recommended in 3-4 weeks following trial of antibiotic therapy to ensure resolution and exclude underlying malignancy. Electronically Signed   By: Lupita Raider M.D.   On: 01/10/2024 09:49   EP PPM/ICD IMPLANT Result Date: 01/10/2024 CONCLUSIONS:  1. Successful implantation of a [employer] dual-chamber pacemaker for symptomatic bradycardia due to complete heart block  2. No early apparent complications.       Lewayne Bunting, MD 01/10/2024 7:29 AM  DG Chest Port 1 View Result Date: 01/09/2024 CLINICAL DATA:  Chest pain EXAM: PORTABLE CHEST 1 VIEW COMPARISON:  01/08/2024 FINDINGS: Cardiomegaly. Pacer wires in the right atrium and right ventricle. Prior aortic valve repair. Aortic atherosclerosis. Platelike opacity in the left upper lobe, favor atelectasis. No overt edema or effusions. No acute bony abnormality. IMPRESSION: Cardiomegaly. Platelike opacity in the left upper lobe, likely atelectasis. Electronically Signed   By: Charlett Nose M.D.   On: 01/09/2024 19:25   ECHOCARDIOGRAM COMPLETE Result Date: 01/09/2024    ECHOCARDIOGRAM REPORT   Patient Name:   Zoe [name] Date of Exam: 01/09/2024 Medical Rec #:  [id number]           Height:       63.0 in Accession #:    [id number]          Weight:       165.0 lb Date of Birth:  1934/05/16            BSA:          1.782 m Patient Age:    89 years            BP:           171/78 mmHg Patient Gender: F                   HR:           59 bpm. Exam Location:  Inpatient Procedure: 2D Echo, Cardiac Doppler and Color Doppler (Both Spectral and Color            Flow Doppler were utilized during procedure). Indications:    Chest Pain R07.9  History:        Patient has prior history of Echocardiogram examinations, most                 recent 04/16/2023. CHF, Previous Myocardial Infarction and                 Angina, CKD, stage 3, Arrythmias:Atrial Fibrillation and Atrial                 Flutter, Signs/Symptoms:Syncope; Risk  Factors:Hypertension and                 Dyslipidemia.                 Aortic Valve: 23 mm Sapien valve is present in the aortic                 position. Procedure Date: 09/03/2018.  Sonographer:    Lucendia Herrlich RCS Referring Phys: [id number] MATTHEW A CARLISLE IMPRESSIONS  1. Left ventricular ejection fraction, by estimation, is 60 to 65%. The left ventricle has normal function. The left ventricle has no regional wall motion abnormalities. Left ventricular diastolic parameters are consistent with Grade II diastolic dysfunction (pseudonormalization). Elevated left ventricular end-diastolic pressure.  2. Right ventricular systolic function is normal. The right ventricular size is normal. There is normal pulmonary artery systolic pressure.  3. Left atrial size was severely  dilated.  4. The mitral valve is normal in structure. Mild mitral valve regurgitation. No evidence of mitral stenosis. Moderate mitral annular calcification.  5. The aortic valve has been repaired/replaced. Aortic valve regurgitation is mild. No aortic stenosis is present. There is a 23 mm Sapien valve present in the aortic position. Procedure Date: 09/03/2018.  6. The inferior vena cava is normal in size with greater than 50% respiratory variability, suggesting right atrial pressure of 3 mmHg. FINDINGS  Left Ventricle: Left ventricular ejection fraction, by estimation, is 60 to 65%. The left ventricle has normal function. The left ventricle has no regional wall motion abnormalities. The left ventricular internal cavity size was normal in size. There is  no left ventricular hypertrophy. Left ventricular diastolic parameters are consistent with Grade II diastolic dysfunction (pseudonormalization). Elevated left ventricular end-diastolic pressure. Right Ventricle: The right ventricular size is normal. No increase in right ventricular wall thickness. Right ventricular systolic function is normal. There is normal pulmonary artery systolic pressure.  The tricuspid regurgitant velocity is 2.39 m/s, and  with an assumed right atrial pressure of 3 mmHg, the estimated right ventricular systolic pressure is 25.8 mmHg. Left Atrium: Left atrial size was severely dilated. Right Atrium: Right atrial size was normal in size. Pericardium: There is no evidence of pericardial effusion. Mitral Valve: The mitral valve is normal in structure. Moderate mitral annular calcification. Mild mitral valve regurgitation. No evidence of mitral valve stenosis. Tricuspid Valve: The tricuspid valve is normal in structure. Tricuspid valve regurgitation is mild . No evidence of tricuspid stenosis. Aortic Valve: The aortic valve has been repaired/replaced. Aortic valve regurgitation is mild. No aortic stenosis is present. Aortic valve mean gradient measures 9.0 mmHg. Aortic valve peak gradient measures 17.1 mmHg. Aortic valve area, by VTI measures 1.00 cm. There is a 23 mm Sapien valve present in the aortic position. Procedure Date: 09/03/2018. Pulmonic Valve: The pulmonic valve was normal in structure. Pulmonic valve regurgitation is trivial. No evidence of pulmonic stenosis. Aorta: The aortic root is normal in size and structure. Venous: The inferior vena cava is normal in size with greater than 50% respiratory variability, suggesting right atrial pressure of 3 mmHg. IAS/Shunts: No atrial level shunt detected by color flow Doppler.  LEFT VENTRICLE PLAX 2D LVIDd:         4.30 cm   Diastology LVIDs:         2.80 cm   LV e' medial:    0.05 cm/s LV PW:         1.30 cm   LV E/e' medial:  25.7 LV IVS:        1.10 cm   LV e' lateral:   0.06 cm/s LVOT diam:     1.70 cm   LV E/e' lateral: 23.7 LV SV:         51 LV SV Index:   29 LVOT Area:     2.27 cm  RIGHT VENTRICLE             IVC RV S prime:     12.00 cm/s  IVC diam: 1.60 cm TAPSE (M-mode): 2.3 cm LEFT ATRIUM             Index        RIGHT ATRIUM           Index LA diam:        4.40 cm 2.47 cm/m   RA Area:     16.20 cm LA Vol (A2C):   92.5  ml  51.91 ml/m  RA Volume:   36.00 ml  20.20 ml/m LA Vol (A4C):   61.0 ml 34.26 ml/m LA Biplane Vol: 75.9 ml 42.60 ml/m  AORTIC VALVE                     PULMONIC VALVE AV Area (Vmax):    1.03 cm      PR End Diast Vel: 5.66 msec AV Area (Vmean):   1.01 cm AV Area (VTI):     1.00 cm AV Vmax:           207.00 cm/s AV Vmean:          139.000 cm/s AV VTI:            0.509 m AV Peak Grad:      17.1 mmHg AV Mean Grad:      9.0 mmHg LVOT Vmax:         93.90 cm/s LVOT Vmean:        61.900 cm/s LVOT VTI:          0.225 m LVOT/AV VTI ratio: 0.44  AORTA Ao Root diam: 2.30 cm Ao Asc diam:  3.10 cm MITRAL VALVE                TRICUSPID VALVE MV Area (PHT): 3.72 cm     TR Peak grad:   22.8 mmHg MV Decel Time: 204 msec     TR Vmax:        239.00 cm/s MR Peak grad: 87.6 mmHg MR Vmax:      468.00 cm/s   SHUNTS MV E velocity: 1.37 cm/s    Systemic VTI:  0.22 m MV A velocity: 106.00 cm/s  Systemic Diam: 1.70 cm MV E/A ratio:  0.01 Chilton Si MD Electronically signed by Chilton Si MD Signature Date/Time: 01/09/2024/4:04:13 PM    Final    CT CERVICAL SPINE WO CONTRAST Result Date: 01/08/2024 CLINICAL DATA:  Larey Seat, hit head EXAM: CT CERVICAL SPINE WITHOUT CONTRAST TECHNIQUE: Multidetector CT imaging of the cervical spine was performed without intravenous contrast. Multiplanar CT image reconstructions were also generated. RADIATION DOSE REDUCTION: This exam was performed according to the departmental dose-optimization program which includes automated exposure control, adjustment of the mA and/or kV according to patient size and/or use of iterative reconstruction technique. COMPARISON:  02/19/2021 FINDINGS: Alignment: Alignment is grossly anatomic. Skull base and vertebrae: No acute fracture. No primary bone lesion or focal pathologic process. Soft tissues and spinal canal: No prevertebral fluid or swelling. No visible canal hematoma. Disc levels: Partial bony fusion across the C3-4 and C4-5 levels. Stable hypertrophic  changes at C1-C2. Multilevel facet hypertrophy most pronounced at C5-6 and C6-7. Upper chest: Airway is patent.  Lung apices are clear. Other: Reconstructed images demonstrate no additional findings. IMPRESSION: 1. No acute cervical spine fracture. 2. Stable multilevel cervical degenerative changes. Electronically Signed   By: Sharlet Salina M.D.   On: 01/08/2024 16:21   CT HEAD WO CONTRAST Result Date: 01/08/2024 CLINICAL DATA:  Larey Seat, hit head, anticoagulated EXAM: CT HEAD WITHOUT CONTRAST TECHNIQUE: Contiguous axial images were obtained from the base of the skull through the vertex without intravenous contrast. RADIATION DOSE REDUCTION: This exam was performed according to the departmental dose-optimization program which includes automated exposure control, adjustment of the mA and/or kV according to patient size and/or use of iterative reconstruction technique. COMPARISON:  09/05/2023 FINDINGS: Brain: Hypodensities within the periventricular white matter are again noted, compatible with chronic small vessel ischemic changes. No  evidence of acute infarct or hemorrhage. The lateral ventricles and midline structures are unremarkable. No acute extra-axial fluid collections. No mass effect. Vascular: No hyperdense vessel or unexpected calcification. Skull: Normal. Negative for fracture or focal lesion. Sinuses/Orbits: Postsurgical changes from prior medial wall antrectomies and bilateral ethmoidectomies. Minimal retained secretions within the maxillary and sphenoid sinuses. Other: None. IMPRESSION: 1. No acute intracranial process. 2. Stable chronic small-vessel ischemic changes within the white matter. Electronically Signed   By: Sharlet Salina M.D.   On: 01/08/2024 16:18   DG Pelvis Portable Result Date: 01/08/2024 CLINICAL DATA:  Larey Seat EXAM: PORTABLE PELVIS 1-2 VIEWS COMPARISON:  09/30/2022 FINDINGS: Supine frontal view of the pelvis includes both hips. No acute displaced fracture. Symmetrical bilateral hip  osteoarthritis. The sacroiliac joints are normal. IMPRESSION: 1. No acute displaced fracture. 2. Symmetrical bilateral hip osteoarthritis. Electronically Signed   By: Sharlet Salina M.D.   On: 01/08/2024 16:16   DG Chest Port 1 View Result Date: 01/08/2024 CLINICAL DATA:  Larey Seat, atrial fibrillation EXAM: PORTABLE CHEST 1 VIEW COMPARISON:  10/01/2023 FINDINGS: Single frontal view of the chest demonstrates a stable cardiac silhouette. Stable aortic valve prosthesis. No acute airspace disease, effusion, or pneumothorax. No acute bony abnormalities. IMPRESSION: 1. No acute intrathoracic process. Electronically Signed   By: Sharlet Salina M.D.   On: 01/08/2024 16:16   LOS: 1 day  Total time spent in review of labs and imaging, patient evaluation, formulation of plan, documentation and communication with family: 50 minutes  Lanae Boast, MD  [facility]  01/10/2024, 10:14 AM

## 2024-01-10 NOTE — Evaluation (Signed)
 Physical Therapy Evaluation Patient Details Name: Zoe [name] MRN: [id number] DOB: May 17, 1934 Today's Date: 01/10/2024  History of Present Illness  89 y.o. female admitted 3/18 with syncopal episode, described as -while changing her gown sudden loss of balance and fell onto the floor hitting her head, may have lost her consciousness.PAcemaker placed 3/19.  PMH: multiple myeloma, status post TAVR, paroxysmal atrial fibrillation, diastolic CHF, possible mild coronary angio spasm per cardiac cath in 2014, history of breast cancer status post lumpectomy in 2000, history of embolic CVA  Clinical Impression  Pt admitted with above diagnosis. Pt was able to sit EOB but had difficulty standing to her feet even with mod assist. Pt also desaturated to 87% on RA and had to replace O2 at 3L.  Pt reports steady decline in function at home with multiple falls and pt is alone at times as grandson is in school at [employer].  Will benefit from post acute rehab < 3 hours day prior to d/c.  Will follow acutely.  Pt currently with functional limitations due to the deficits listed below (see PT Problem List). Pt will benefit from acute skilled PT to increase their independence and safety with mobility to allow discharge.           If plan is discharge home, recommend the following: A little help with walking and/or transfers;A little help with bathing/dressing/bathroom;Assistance with cooking/housework;Help with stairs or ramp for entrance;Assist for transportation   Can travel by private vehicle   No    Equipment Recommendations None recommended by PT  Recommendations for Other Services       Functional Status Assessment Patient has had a recent decline in their functional status and demonstrates the ability to make significant improvements in function in a reasonable and predictable amount of time.     Precautions / Restrictions Precautions Precautions: Fall Restrictions Other Position/Activity  Restrictions: Decr use left UE due to pacemaker precautions per protocol      Mobility  Bed Mobility Overal bed mobility: Needs Assistance Bed Mobility: Supine to Sit     Supine to sit: Min assist     General bed mobility comments: assist for trunk elevation due to limited use of left UE    Transfers Overall transfer level: Needs assistance Equipment used: None Transfers: Sit to/from Stand Sit to Stand: Mod assist           General transfer comment: Pt could not stand to her feet and was reporting dizziness as well. HR and BP stable however O2 on RA as low as 87% therefore replaced 3LO2.    Ambulation/Gait                  Stairs            Wheelchair Mobility     Tilt Bed    Modified Rankin (Stroke Patients Only)       Balance                                             Pertinent Vitals/Pain Pain Assessment Pain Assessment: Faces Faces Pain Scale: Hurts little more Pain Location: left shoulder Pain Descriptors / Indicators: Aching, Discomfort, Grimacing, Guarding Pain Intervention(s): Limited activity within patient's tolerance, Monitored during session, Repositioned    Home Living Family/patient expects to be discharged to:: Private residence Living Arrangements: Children (grandson is 89 years old and attends  [employer] 3 days week for 6 hours; aide comes in 5 days week for 4 hours) Available Help at Discharge: Family;Available PRN/intermittently Type of Home: Other(Comment) (Town house) Home Access: Stairs to enter Entrance Stairs-Rails: None Secretary/administrator of Steps: 1   Home Layout: One level Home Equipment: Shower seat;Cane - single point;Rollator (4 wheels);Educational psychologist (2 wheels);Grab bars - tub/shower;Grab bars - toilet;Hand held shower head Additional Comments: 2 falls and one she tripped over a cord and the second fall was in bathroom    Prior Function Prior Level of Function : Needs  assist             Mobility Comments: Rollator for ambulation, grandson provide transportation ADLs Comments: grandson assists with housework     Extremity/Trunk Assessment   Upper Extremity Assessment Upper Extremity Assessment: Defer to OT evaluation    Lower Extremity Assessment Lower Extremity Assessment: Generalized weakness    Cervical / Trunk Assessment Cervical / Trunk Assessment: Normal  Communication   Communication Communication: Impaired Factors Affecting Communication: Hearing impaired    Cognition Arousal: Alert Behavior During Therapy: WFL for tasks assessed/performed   PT - Cognitive impairments: No apparent impairments                         Following commands: Intact       Cueing       General Comments General comments (skin integrity, edema, etc.): 65 bpm, 156/65,    Exercises     Assessment/Plan    PT Assessment Patient needs continued PT services  PT Problem List Decreased activity tolerance;Decreased balance;Decreased mobility;Decreased knowledge of use of DME;Decreased safety awareness;Decreased knowledge of precautions;Cardiopulmonary status limiting activity;Pain       PT Treatment Interventions DME instruction;Functional mobility training;Therapeutic activities;Therapeutic exercise;Balance training;Patient/family education;Gait training;Stair training    PT Goals (Current goals can be found in the Care Plan section)  Acute Rehab PT Goals Patient Stated Goal: to get better PT Goal Formulation: With patient Time For Goal Achievement: 01/24/24 Potential to Achieve Goals: Good    Frequency Min 2X/week     Co-evaluation               AM-PAC PT "6 Clicks" Mobility  Outcome Measure Help needed turning from your back to your side while in a flat bed without using bedrails?: A Lot Help needed moving from lying on your back to sitting on the side of a flat bed without using bedrails?: A Little Help needed moving  to and from a bed to a chair (including a wheelchair)?: Total Help needed standing up from a chair using your arms (e.g., wheelchair or bedside chair)?: Total Help needed to walk in hospital room?: Total Help needed climbing 3-5 steps with a railing? : Total 6 Click Score: 9    End of Session Equipment Utilized During Treatment: Gait belt Activity Tolerance: Patient limited by fatigue Patient left: in bed;with call bell/phone within reach;with bed alarm set Nurse Communication: Mobility status PT Visit Diagnosis: History of falling (Z91.81);Other abnormalities of gait and mobility (R26.89)    Time: 1610-9604 PT Time Calculation (min) (ACUTE ONLY): 30 min   Charges:   PT Evaluation $PT Eval Moderate Complexity: 1 Mod PT Treatments $Therapeutic Activity: 8-22 mins PT General Charges $$ ACUTE PT VISIT: 1 Visit         Joycelynn Fritsche M,PT Acute Rehab Services [phone]   Bevelyn Buckles 01/10/2024, 2:02 PM

## 2024-01-11 ENCOUNTER — Inpatient Hospital Stay (HOSPITAL_COMMUNITY)

## 2024-01-11 DIAGNOSIS — I459 Conduction disorder, unspecified: Secondary | ICD-10-CM | POA: Diagnosis not present

## 2024-01-11 MED ORDER — LIDOCAINE 5 % EX PTCH
1.0000 | MEDICATED_PATCH | CUTANEOUS | Status: DC
  Administered 2024-01-11 – 2024-01-13 (×3): 1 via TRANSDERMAL
  Filled 2024-01-11 (×3): qty 1

## 2024-01-11 MED ORDER — IBUPROFEN 200 MG PO TABS
400.0000 mg | ORAL_TABLET | Freq: Once | ORAL | Status: AC
  Administered 2024-01-11: 400 mg via ORAL
  Filled 2024-01-11: qty 2

## 2024-01-11 MED ORDER — LOSARTAN POTASSIUM 25 MG PO TABS
25.0000 mg | ORAL_TABLET | Freq: Once | ORAL | Status: AC
  Administered 2024-01-11: 25 mg via ORAL
  Filled 2024-01-11: qty 1

## 2024-01-11 MED ORDER — LOSARTAN POTASSIUM 50 MG PO TABS
50.0000 mg | ORAL_TABLET | Freq: Every day | ORAL | Status: DC
  Administered 2024-01-12 – 2024-01-14 (×3): 50 mg via ORAL
  Filled 2024-01-11 (×3): qty 1

## 2024-01-11 NOTE — Progress Notes (Signed)
 PT Cancellation Note  Patient Details Name: Zoe [name] MRN: [id number] DOB: Aug 12, 1934   Cancelled Treatment:    Reason Eval/Treat Not Completed: Patient at procedure or test/unavailable (Transport in room getting ready to take pt to CT. Will follow-up in the afternoon as schedule permits.)  Cheri Guppy, PT, DPT Acute Rehabilitation Services Office: [phone] Secure Chat Preferred  Zoe [name] 01/11/2024, 12:01 PM

## 2024-01-11 NOTE — TOC Initial Note (Addendum)
 Transition of Care [facility]) - Initial/Assessment Note    Patient Details  Name: Zoe [name] MRN: [id number] Date of Birth: Jun 23, 1934  [facility] CM/SW Contact:    Michaela Corner, LCSWA Phone Number: 01/11/2024, 11:40 AM  Clinical Narrative:    CSW met patient at bedside and introduced self/role. CSW discussed PT recs for SNF. Patient is agreeable and gave CSW permission to fax out referrals. SNF workup complete at this time.   Patient stated she has been to [facility] in the past and would also like CSW to send referral to [facility]. Patient is from home with grandson, who she states has intellectual disabilities but is self sufficient. Patient states she has a home aid that comes in from 8:30 am to 12:30 pm. She stated she has been talking with her family having an aid come in at night as well.   3:44 PM CSW provided patient with bed offers for SNF, with their medicare.gov ratings. [facility] had not responded when CSW provided bed offers. Patient stated she would like to go to [facility] - they have extended a bed offer at this time. Patient is agreeable to go to [facility] at this time and asked CSW to reach out to her DIL [name] and daughter Raynelle Fanning) to update them.   3:55 PM CSW left VM for [name] but was able to talk to Raynelle Fanning and inform her of patients discharge plan. Raynelle Fanning stated she will inform the family. CSW will do insurance auth at this time.   4:06 PM Auth approved for [facility]. Auth id [id number]; approval dates 01/12/2024-01/15/2024.   CSW notified [facility] of auth approval. Per MD, patient may potentially be MR tomorrow. CSW asked French Ana at [facility] if patient can dc to them tomorrow. Awaiting response.   4:15 PM [name] called CSW back and CSW relayed the same information given to [place].   TOC will continue to follow.    Expected Discharge Plan: Skilled Nursing Facility Barriers to Discharge: Continued Medical Work up, SNF Pending bed offer, Insurance  Authorization   Patient Goals and CMS Choice Patient states their goals for this hospitalization and ongoing recovery are:: To get better          Expected Discharge Plan and Services In-house Referral: Clinical Social Work     Living arrangements for the past 2 months: Single Family Home                                      Prior Living Arrangements/Services Living arrangements for the past 2 months: Single Family Home Lives with:: Self, Other (Comment) (Adult grandson) Patient language and need for interpreter reviewed:: Yes Do you feel safe going back to the place where you live?: Yes      Need for Family Participation in Patient Care: No (Comment) Care giver support system in place?: No (comment)   Criminal Activity/Legal Involvement Pertinent to Current Situation/Hospitalization: No - Comment as needed  Activities of Daily Living   ADL Screening (condition at time of admission) Independently performs ADLs?: Yes (appropriate for developmental age) Is the patient deaf or have difficulty hearing?: Yes Does the patient have difficulty seeing, even when wearing glasses/contacts?: Yes Does the patient have difficulty concentrating, remembering, or making decisions?: Yes  Permission Sought/Granted Permission sought to share information with : Facility Industrial/product designer granted to share information with : Yes, Verbal Permission Granted  Permission granted to share info w AGENCY: SNFs        Emotional Assessment Appearance:: Appears stated age Attitude/Demeanor/Rapport: Engaged Affect (typically observed): Pleasant Orientation: : Oriented to Self, Oriented to Place, Oriented to  Time Alcohol / Substance Use: Not Applicable Psych Involvement: No (comment)  Admission diagnosis:  Syncope [R55] Chest pain [R07.9] Fall, initial encounter [W19.XXXA] Syncope, unspecified syncope type [R55] Patient Active Problem List   Diagnosis Date Noted    CKD (chronic kidney disease) stage 3, GFR 30-59 ml/min (HCC) 01/09/2024   Heart block AV complete (HCC) 01/09/2024   Syncope 01/08/2024   Chronic anticoagulation 10/02/2023   Vasospastic angina (HCC) 10/01/2023   History of coronary vasospasm 10/01/2023   NSTEMI (non-ST elevated myocardial infarction) (HCC) 10/01/2023   Hypertensive urgency 07/16/2023   Urinary tract infection due to Klebsiella species 07/16/2023   Chronic heart failure with preserved ejection fraction (HFpEF) (HCC) 07/16/2023   Hypokalemia 07/16/2023   Hypocalcemia 07/16/2023   Atypical atrial flutter (HCC) 04/19/2023   Acute on chronic diastolic heart failure (HCC) 04/19/2023   Paroxysmal atrial fibrillation with rapid ventricular response (HCC) 04/18/2023   History of stroke 04/16/2023   Fall at home, initial encounter 04/16/2023   Atrial fib/flutter, transient (HCC) 04/16/2023   Paroxysmal atrial fibrillation (HCC) 04/15/2023   Elevated troponin 04/15/2023   Chest pain 04/13/2023   Stroke due to embolism (HCC) 03/20/2023   Chest pain, rule out acute myocardial infarction 02/17/2023   Chemotherapy-induced neuropathy (HCC) 11/28/2022   Pain due to onychomycosis of toenails of both feet 11/08/2022   Multiple myeloma (HCC) 07/24/2022   Chronic diastolic CHF (congestive heart failure) (HCC) 05/16/2022   Symptomatic anemia 05/16/2022   Hyponatremia 04/02/2022   Microcytic anemia 04/02/2022   S/P TAVR (transcatheter aortic valve replacement) 09/03/2018   Hypothyroidism    Hypertension    Hyperlipidemia    GERD (gastroesophageal reflux disease)    S/P right TKA 06/18/2017   Class 1 obesity 03/15/2016   S/P left TKA 03/13/2016   History of breast cancer, DCIS, lumpectomy March 2000 12/13/2011   PCP:  Irena Reichmann, DO Pharmacy:   [id number] - [place], [place] - [address] AT [facility] [address] [place] [address] Phone: [phone] Fax:  [phone]  [facility], [place] - [address] [address] [place] [address] Phone: [phone] Fax: [phone]  [facility] [address] [place] [address] Phone: [phone] Fax: [phone]     Social Drivers of Health (SDOH) Social History: SDOH Screenings   Food Insecurity: No Food Insecurity (01/09/2024)  Housing: Low Risk  (01/09/2024)  Transportation Needs: No Transportation Needs (01/09/2024)  Utilities: Not At Risk (01/09/2024)  Depression (PHQ2-9): Low Risk  (11/18/2018)  Financial Resource Strain: Low Risk  (11/12/2018)  Physical Activity: Inactive (11/12/2018)  Social Connections: Moderately Isolated (01/09/2024)  Stress: Stress Concern Present (11/12/2018)  Tobacco Use: Low Risk  (01/09/2024)   SDOH Interventions:     Readmission Risk Interventions     No data to display

## 2024-01-11 NOTE — [place] FL2 (Signed)
 [place] MEDICAID FL2 LEVEL OF CARE FORM     IDENTIFICATION  Patient Name: Zoe [name] Birthdate: 1934-05-15 Sex: female Admission Date (Current Location): 01/08/2024  [facility] and [name] Number:  Producer, television/film/video and Address:  [facility], [address], [place], [place] [address]      Provider Number: [id number]  Attending Physician Name and Address:  Lanae Boast, MD  Relative Name and Phone Number:       Current Level of Care: Hospital Recommended Level of Care: Skilled Nursing Facility Prior Approval Number:    Date Approved/Denied:   PASRR Number: [id number]  Discharge Plan: SNF    Current Diagnoses: Patient Active Problem List   Diagnosis Date Noted   CKD (chronic kidney disease) stage 3, GFR 30-59 ml/min (HCC) 01/09/2024   Heart block AV complete (HCC) 01/09/2024   Syncope 01/08/2024   Chronic anticoagulation 10/02/2023   Vasospastic angina (HCC) 10/01/2023   History of coronary vasospasm 10/01/2023   NSTEMI (non-ST elevated myocardial infarction) (HCC) 10/01/2023   Hypertensive urgency 07/16/2023   Urinary tract infection due to Klebsiella species 07/16/2023   Chronic heart failure with preserved ejection fraction (HFpEF) (HCC) 07/16/2023   Hypokalemia 07/16/2023   Hypocalcemia 07/16/2023   Atypical atrial flutter (HCC) 04/19/2023   Acute on chronic diastolic heart failure (HCC) 04/19/2023   Paroxysmal atrial fibrillation with rapid ventricular response (HCC) 04/18/2023   History of stroke 04/16/2023   Fall at home, initial encounter 04/16/2023   Atrial fib/flutter, transient (HCC) 04/16/2023   Paroxysmal atrial fibrillation (HCC) 04/15/2023   Elevated troponin 04/15/2023   Chest pain 04/13/2023   Stroke due to embolism (HCC) 03/20/2023   Chest pain, rule out acute myocardial infarction 02/17/2023   Chemotherapy-induced neuropathy (HCC) 11/28/2022   Pain due to onychomycosis of toenails of both feet 11/08/2022    Multiple myeloma (HCC) 07/24/2022   Chronic diastolic CHF (congestive heart failure) (HCC) 05/16/2022   Symptomatic anemia 05/16/2022   Hyponatremia 04/02/2022   Microcytic anemia 04/02/2022   S/P TAVR (transcatheter aortic valve replacement) 09/03/2018   Hypothyroidism    Hypertension    Hyperlipidemia    GERD (gastroesophageal reflux disease)    S/P right TKA 06/18/2017   Class 1 obesity 03/15/2016   S/P left TKA 03/13/2016   History of breast cancer, DCIS, lumpectomy March 2000 12/13/2011    Orientation RESPIRATION BLADDER Height & Weight     Self, Time, Situation  O2 (3L [place]) Incontinent Weight: 162 lb 14.7 oz (73.9 kg) Height:  5\' 3"  (160 cm)  BEHAVIORAL SYMPTOMS/MOOD NEUROLOGICAL BOWEL NUTRITION STATUS      Continent Diet (See dc summary)  AMBULATORY STATUS COMMUNICATION OF NEEDS Skin   Extensive Assist Verbally Other (Comment) (Incision - Sternum Left;Upper)                       Personal Care Assistance Level of Assistance  Bathing, Feeding, Dressing Bathing Assistance:  (see dc summary) Feeding assistance:  (see dc summary) Dressing Assistance:  (see dc summary)     Functional Limitations Info  Sight, Hearing, Speech Sight Info: Adequate Hearing Info: Adequate Speech Info: Adequate    SPECIAL CARE FACTORS FREQUENCY  PT (By licensed PT), OT (By licensed OT)     PT Frequency: 5x week OT Frequency: 5x week            Contractures Contractures Info: Not present    Additional Factors Info  Code Status, Allergies Code Status Info:  Full Allergies Info: Quinolones, Penicillins, Sulfa Antibiotics           Current Medications (01/11/2024):  This is the current hospital active medication list Current Facility-Administered Medications  Medication Dose Route Frequency Provider Last Rate Last Admin   acetaminophen (TYLENOL) tablet 975 mg  975 mg Oral TID Linton Rump, MD   975 mg at 01/11/24 0950   acyclovir (ZOVIRAX) tablet 400 mg  400 mg Oral  BID Eduard Clos, MD   400 mg at 01/11/24 7846   amiodarone (PACERONE) tablet 100 mg  100 mg Oral Daily Graciella Freer, PA-C   100 mg at 01/11/24 9629   levothyroxine (SYNTHROID) tablet 100 mcg  100 mcg Oral QAC breakfast Eduard Clos, MD   100 mcg at 01/11/24 0636   lidocaine (LIDODERM) 5 % 1 patch  1 patch Transdermal Q24H Kc, Dayna Barker, MD   1 patch at 01/11/24 1027   losartan (COZAAR) tablet 25 mg  25 mg Oral Daily Eduard Clos, MD   25 mg at 01/11/24 0950   metoprolol succinate (TOPROL-XL) 24 hr tablet 50 mg  50 mg Oral Daily Linton Rump, MD   50 mg at 01/11/24 0950   nitroGLYCERIN (NITROSTAT) SL tablet 0.4 mg  0.4 mg Sublingual Q5 min PRN Eduard Clos, MD   0.4 mg at 01/09/24 2008   ondansetron (ZOFRAN) injection 4 mg  4 mg Intravenous Q6H PRN Marinus Maw, MD       oxyCODONE (Oxy IR/ROXICODONE) immediate release tablet 5 mg  5 mg Oral Q6H PRN Linton Rump, MD   5 mg at 01/11/24 0636   pantoprazole (PROTONIX) EC tablet 40 mg  40 mg Oral Q0600 Eduard Clos, MD   40 mg at 01/11/24 5284   polyvinyl alcohol (LIQUIFILM TEARS) 1.4 % ophthalmic solution 1 drop  1 drop Both Eyes QHS Linton Rump, MD   1 drop at 01/10/24 2200   pravastatin (PRAVACHOL) tablet 40 mg  40 mg Oral QPM Eduard Clos, MD   40 mg at 01/10/24 1617     Discharge Medications: Please see discharge summary for a list of discharge medications.  Relevant Imaging Results:  Relevant Lab Results:   Additional Information SSN [id number]  Michaela Corner, [place]

## 2024-01-11 NOTE — Progress Notes (Signed)
 PROGRESS NOTE Zoe [name]  ZOX:[id number] DOB: 08-20-1934 DOA: 01/08/2024 PCP: Irena Reichmann, DO  Brief Narrative/Hospital Course: 89 y.o. female with history of multiple myeloma, status post TAVR, paroxysmal atrial fibrillation, diastolic CHF, possible mild coronary angio spasm per cardiac cath in 2014, history of breast cancer status post lumpectomy in 2000, history of embolic CVA was brought to the ER after patient had a syncopal episode, described as -while changing her gown sudden loss of balance and fell onto the floor hitting her head, may have lost her consciousness. In the ED workup unremarkable with CT head, C-spine and x-ray of the pelvis not showing anything acute.  Chest x-ray unremarkable EKG shows normal sinus rhythm.  No complaint of chest pain EKG sinus rhythm.  Troponin elevated :108>  102, was started on heparin infusion, Cardiology consulted and admitted for syncope and chest pain for evaluation. 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  Patient having ongoing chest pain but reproduced with palpation and has bruise on the sternal area suspected to be musculoskeletal pain due to her fall and also has elevated troponin postprocedure.  D-dimer were checked given patient's need for supplemental oxygen and unremarkable, suspect oxygen is due to patient's chest discomfort preventing lung expansion. Patient deconditioned and weak PT OT recommending skilled nursing facility  Consultation: Cardiology, EP cardiology  Significant procedures/imaging: Tte 3/19> EF 60-65%, G2 DD LA-severely dilated aortic valve repaired/replaced no aortic stenosis Dual-chamber pacemaker placement 3/19     Subjective: Seen and examined Chest pain in the sternal area where there is a area of bruise-reports pain worsened after procedure. D-dimer was negative yesterday Not on oxygen this morning Overnight  afebrile BP stable  Assessment and Plan: Principal Problem:   Syncope Active Problems:   Chest pain   Elevated troponin   Chronic diastolic CHF (congestive heart failure) (HCC)   Paroxysmal atrial fibrillation (HCC)   Hypertension   S/P TAVR (transcatheter aortic valve replacement)   Hyperlipidemia   Multiple myeloma (HCC)   Hypothyroidism   History of breast cancer, DCIS, lumpectomy March 2000   GERD (gastroesophageal reflux disease)   Paroxysmal atrial fibrillation with rapid ventricular response (HCC)   Vasospastic angina (HCC)   History of coronary vasospasm   Chronic anticoagulation   CKD (chronic kidney disease) stage 3, GFR 30-59 ml/min (HCC)   Heart block AV complete (HCC)   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   Chest pain Elevated troponin:? Demand ischemia VS nstemi-likely from hypertension Emeregency Hx of Severe LVH History of TAVR: Normal coronaries in 2019, troponin elevated and trended up -flat seen by cardiology pain worse since procedure she says but has area of bruise and had a fall at home.  Per cardiology not concerned about cardiac pain likely musculoskeletal, of note D-dimer is negative and already on anticoagulation PTA low risk of VTE. Will get CT chest to evaluate for structural/musculoskeletal etiology (does have multiple myeloma history),added ibuprofen x 1 continue pain management Echo fairly stable  Acute respiratory insufficiency SpO2 81% on room air placed on 2 L [place]. Likely from decreased respiratory effort due to chest pain on deep breath or tenderness.  D-dimer negative.  Wean off oxygen  Chronic diastolic CHF: Compensated.  Echo stable plan per cardiology   Hypokalemia: Resolved  Hypothyroidism: Stable, cont her Synthroid.  Multiple myeloma: on Revlimid followed by oncologist.  Paroxysmal atrial fibrillation: on amiodarone. Eliquis  on  hold resume postprocedure per cardiology  Chronic kidney stage II: creatinine at around baseline.  Hypertension: Hypertensive emergency on admit: Continue metoprolol losartan   Prior history of CVA HLD: Cont  statins and holding Eliquis-resume once okay with cardiology  DVT prophylaxis:  Code Status:   Code Status: Full Code Family Communication: plan of care discussed with patient at bedside. Patient status is: Remains hospitalized because of severity of illness Level of care: Telemetry Cardiac   Dispo: The patient is from: HOME.            Anticipated disposition: snf 24 hrs Objective: Vitals last 24 hrs: Vitals:   01/10/24 1525 01/10/24 2108 01/11/24 0625 01/11/24 0823  BP: (!) 162/68 (!) 176/70 (!) 172/74 (!) 184/72  Pulse: 63 64 64 70  Resp: 16 20 18 17   Temp: 97.8 F (36.6 C) 98 F (36.7 C) (!) 97.2 F (36.2 C) (!) 97.5 F (36.4 C)  TempSrc: Oral Oral Oral Oral  SpO2: 97% 99% 93%   Weight:   73.9 kg   Height:       Weight change: -0.2 kg  Physical Examination: General exam: alert awake, oriented at baseline, older than stated age HEENT:Oral mucosa moist, Ear/Nose WNL grossly Respiratory system: Bilaterally clear BS,no use of accessory muscle Cardiovascular system: S1 & S2 +, No JVD.  Sternal area with bruising tender chest wall sternal area Gastrointestinal system: Abdomen soft,NT,ND, BS+ Nervous System: Alert, awake, moving all extremities,and following commands. Extremities: LE edema neg,distal peripheral pulses palpable and warm.  Skin: No rashes,no icterus. MSK: Normal muscle bulk,tone, power   Medications reviewed:  Scheduled Meds:  acetaminophen  975 mg Oral TID   acyclovir  400 mg Oral BID   amiodarone  100 mg Oral Daily   levothyroxine  100 mcg Oral QAC breakfast   lidocaine  1 patch Transdermal Q24H   losartan  25 mg Oral Daily   metoprolol succinate  50 mg Oral Daily   pantoprazole  40 mg Oral Q0600   polyvinyl alcohol  1 drop Both Eyes  QHS   pravastatin  40 mg Oral QPM   Continuous Infusions:    Diet Order             Diet Heart Room service appropriate? Yes; Fluid consistency: Thin  Diet effective now                  Intake/Output Summary (Last 24 hours) at 01/11/2024 1119 Last data filed at 01/11/2024 0300 Gross per 24 hour  Intake 764.96 ml  Output 350 ml  Net 414.96 ml   Net IO Since Admission: 1,284.96 mL [01/11/24 1119]  Wt Readings from Last 3 Encounters:  01/11/24 73.9 kg  01/01/24 74.6 kg  11/02/23 73.7 kg     Unresulted Labs (From admission, onward)     Start     Ordered   01/09/24 1003  Surgical PCR screen  (Screening)  Once,   R        01/09/24 1002          Data Reviewed: I have personally reviewed following labs and imaging studies CBC: Recent Labs  Lab 01/08/24 1354 01/08/24 1413 01/09/24 0407 01/10/24 0905  WBC 7.1  --  5.8 6.3  HGB 12.7 13.3 11.7* 12.0  HCT 38.8 39.0 36.4 36.9  MCV 89.4  --  90.3 89.3  PLT 102*  --  124* 104*   Basic Metabolic Panel:  Recent Labs  Lab 01/08/24 1354 01/08/24 1413 01/09/24 0407 01/10/24  0905  NA 138 137 139 134*  K 3.8 3.6 3.2* 3.5  CL 99 98 106 100  CO2 28  --  27 25  GLUCOSE 130* 124* 91 145*  BUN 19 20 18 16   CREATININE 1.02* 1.00 0.89 1.01*  CALCIUM 8.8*  --  8.0* 8.2*   GFR: Estimated Creatinine Clearance: 37.1 mL/min (A) (by C-G formula based on SCr of 1.01 mg/dL (H)). Liver Function Tests:  Recent Labs  Lab 01/08/24 1354 01/10/24 0905  AST 22 21  ALT 15 14  ALKPHOS 70 63  BILITOT 0.7 1.1  PROT 5.8* 5.3*  ALBUMIN 3.2* 2.8*  No results for input(s): "LIPASE", "AMYLASE" in the last 168 hours. No results for input(s): "AMMONIA" in the last 168 hours. Coagulation Profile:  Recent Labs  Lab 01/08/24 1354  INR 1.3*   Recent Labs    01/09/24 0218  TSH 0.891   Sepsis Labs: Recent Labs  Lab 01/08/24 1409 01/08/24 1629  LATICACIDVEN 2.2* 1.3   No results found for this or any previous visit (from the past  240 hours).  Antimicrobials/Microbiology: Anti-infectives (From admission, onward)    Start     Dose/Rate Route Frequency Ordered Stop   01/10/24 0600  vancomycin (VANCOCIN) IVPB 1000 mg/200 mL premix        1,000 mg 200 mL/hr over 60 Minutes Intravenous  Once 01/09/24 1830 01/10/24 0739   01/09/24 1618  vancomycin (VANCOCIN) IVPB 1000 mg/200 mL premix        over 60 Minutes  Continuous PRN 01/09/24 1619 01/09/24 1739   01/09/24 1533  ceFAZolin (ANCEF) 2-4 GM/100ML-% IVPB       Note to Pharmacy: Zadie Cleverly B: cabinet override      01/09/24 1533 01/10/24 0344   01/09/24 1533  sodium chloride 0.9 % with gentamicin (GARAMYCIN) ADS Med       Note to Pharmacy: Zadie Cleverly B: cabinet override      01/09/24 1533 01/10/24 0344   01/09/24 1230  vancomycin (VANCOCIN) IVPB 1000 mg/200 mL premix        1,000 mg 200 mL/hr over 60 Minutes Intravenous On call 01/09/24 1227 01/10/24 1230   01/09/24 1015  gentamicin (GARAMYCIN) 80 mg in sodium chloride 0.9 % 500 mL irrigation        80 mg Irrigation On call 01/09/24 1002 01/09/24 1658   01/09/24 1015  ceFAZolin (ANCEF) IVPB 2g/100 mL premix  Status:  Discontinued        2 g 200 mL/hr over 30 Minutes Intravenous On call 01/09/24 1002 01/09/24 1227   01/09/24 1000  acyclovir (ZOVIRAX) tablet 400 mg        400 mg Oral 2 times daily 01/09/24 0043           Component Value Date/Time   SDES  08/17/2023 1000    URINE, CLEAN CATCH Performed at [facility], [address], [place], [place] [address]    SPECREQUEST  08/17/2023 1000    URINE, CLEAN CATCH Performed at [facility], [address] [place], [place] [address]    CULT (A) 08/17/2023 1000    <10,000 COLONIES/mL INSIGNIFICANT GROWTH Performed at [facility], [address], [place], [place] [address]    REPTSTATUS 08/18/2023 FINAL 08/17/2023 1000     Radiology Studies: DG Chest 2 View Result Date: 01/10/2024 CLINICAL  DATA:  Pacemaker placement. EXAM: CHEST - 2 VIEW COMPARISON:  January 09, 2024. FINDINGS: Stable cardiomediastinal silhouette. Left-sided  pacemaker is unchanged. Stable left upper lobe opacity is noted concerning for pneumonia or atelectasis. Status post kyphoplasty of lower thoracic vertebral body. Right lung is clear. No pneumothorax is noted. IMPRESSION: Left upper lobe opacity is again noted concerning for pneumonia or atelectasis. Followup PA and lateral chest X-ray is recommended in 3-4 weeks following trial of antibiotic therapy to ensure resolution and exclude underlying malignancy. Electronically Signed   By: Lupita Raider M.D.   On: 01/10/2024 09:49   EP PPM/ICD IMPLANT Result Date: 01/10/2024 CONCLUSIONS:  1. Successful implantation of a [employer] dual-chamber pacemaker for symptomatic bradycardia due to complete heart block  2. No early apparent complications.       Lewayne Bunting, MD 01/10/2024 7:29 AM  DG Chest Port 1 View Result Date: 01/09/2024 CLINICAL DATA:  Chest pain EXAM: PORTABLE CHEST 1 VIEW COMPARISON:  01/08/2024 FINDINGS: Cardiomegaly. Pacer wires in the right atrium and right ventricle. Prior aortic valve repair. Aortic atherosclerosis. Platelike opacity in the left upper lobe, favor atelectasis. No overt edema or effusions. No acute bony abnormality. IMPRESSION: Cardiomegaly. Platelike opacity in the left upper lobe, likely atelectasis. Electronically Signed   By: Charlett Nose M.D.   On: 01/09/2024 19:25   ECHOCARDIOGRAM COMPLETE Result Date: 01/09/2024    ECHOCARDIOGRAM REPORT   Patient Name:   Zoe [name] Date of Exam: 01/09/2024 Medical Rec #:  [id number]           Height:       63.0 in Accession #:    [id number]          Weight:       165.0 lb Date of Birth:  [birth year]            BSA:          1.782 m Patient Age:    89 years            BP:           171/78 mmHg Patient Gender: F                   HR:           59 bpm. Exam Location:  Inpatient Procedure: 2D Echo, Cardiac  Doppler and Color Doppler (Both Spectral and Color            Flow Doppler were utilized during procedure). Indications:    Chest Pain R07.9  History:        Patient has prior history of Echocardiogram examinations, most                 recent 04/16/2023. CHF, Previous Myocardial Infarction and                 Angina, CKD, stage 3, Arrythmias:Atrial Fibrillation and Atrial                 Flutter, Signs/Symptoms:Syncope; Risk Factors:Hypertension and                 Dyslipidemia.                 Aortic Valve: 23 mm Sapien valve is present in the aortic                 position. Procedure Date: 09/03/2018.  Sonographer:    Lucendia Herrlich RCS Referring Phys: [id number] MATTHEW A CARLISLE IMPRESSIONS  1. Left ventricular ejection fraction, by estimation, is 60 to 65%. The left ventricle has normal function. The left ventricle  has no regional wall motion abnormalities. Left ventricular diastolic parameters are consistent with Grade II diastolic dysfunction (pseudonormalization). Elevated left ventricular end-diastolic pressure.  2. Right ventricular systolic function is normal. The right ventricular size is normal. There is normal pulmonary artery systolic pressure.  3. Left atrial size was severely dilated.  4. The mitral valve is normal in structure. Mild mitral valve regurgitation. No evidence of mitral stenosis. Moderate mitral annular calcification.  5. The aortic valve has been repaired/replaced. Aortic valve regurgitation is mild. No aortic stenosis is present. There is a 23 mm Sapien valve present in the aortic position. Procedure Date: 09/03/2018.  6. The inferior vena cava is normal in size with greater than 50% respiratory variability, suggesting right atrial pressure of 3 mmHg. FINDINGS  Left Ventricle: Left ventricular ejection fraction, by estimation, is 60 to 65%. The left ventricle has normal function. The left ventricle has no regional wall motion abnormalities. The left ventricular internal cavity size  was normal in size. There is  no left ventricular hypertrophy. Left ventricular diastolic parameters are consistent with Grade II diastolic dysfunction (pseudonormalization). Elevated left ventricular end-diastolic pressure. Right Ventricle: The right ventricular size is normal. No increase in right ventricular wall thickness. Right ventricular systolic function is normal. There is normal pulmonary artery systolic pressure. The tricuspid regurgitant velocity is 2.39 m/s, and  with an assumed right atrial pressure of 3 mmHg, the estimated right ventricular systolic pressure is 25.8 mmHg. Left Atrium: Left atrial size was severely dilated. Right Atrium: Right atrial size was normal in size. Pericardium: There is no evidence of pericardial effusion. Mitral Valve: The mitral valve is normal in structure. Moderate mitral annular calcification. Mild mitral valve regurgitation. No evidence of mitral valve stenosis. Tricuspid Valve: The tricuspid valve is normal in structure. Tricuspid valve regurgitation is mild . No evidence of tricuspid stenosis. Aortic Valve: The aortic valve has been repaired/replaced. Aortic valve regurgitation is mild. No aortic stenosis is present. Aortic valve mean gradient measures 9.0 mmHg. Aortic valve peak gradient measures 17.1 mmHg. Aortic valve area, by VTI measures 1.00 cm. There is a 23 mm Sapien valve present in the aortic position. Procedure Date: 09/03/2018. Pulmonic Valve: The pulmonic valve was normal in structure. Pulmonic valve regurgitation is trivial. No evidence of pulmonic stenosis. Aorta: The aortic root is normal in size and structure. Venous: The inferior vena cava is normal in size with greater than 50% respiratory variability, suggesting right atrial pressure of 3 mmHg. IAS/Shunts: No atrial level shunt detected by color flow Doppler.  LEFT VENTRICLE PLAX 2D LVIDd:         4.30 cm   Diastology LVIDs:         2.80 cm   LV e' medial:    0.05 cm/s LV PW:         1.30 cm   LV  E/e' medial:  25.7 LV IVS:        1.10 cm   LV e' lateral:   0.06 cm/s LVOT diam:     1.70 cm   LV E/e' lateral: 23.7 LV SV:         51 LV SV Index:   29 LVOT Area:     2.27 cm  RIGHT VENTRICLE             IVC RV S prime:     12.00 cm/s  IVC diam: 1.60 cm TAPSE (M-mode): 2.3 cm LEFT ATRIUM  Index        RIGHT ATRIUM           Index LA diam:        4.40 cm 2.47 cm/m   RA Area:     16.20 cm LA Vol (A2C):   92.5 ml 51.91 ml/m  RA Volume:   36.00 ml  20.20 ml/m LA Vol (A4C):   61.0 ml 34.26 ml/m LA Biplane Vol: 75.9 ml 42.60 ml/m  AORTIC VALVE                     PULMONIC VALVE AV Area (Vmax):    1.03 cm      PR End Diast Vel: 5.66 msec AV Area (Vmean):   1.01 cm AV Area (VTI):     1.00 cm AV Vmax:           207.00 cm/s AV Vmean:          139.000 cm/s AV VTI:            0.509 m AV Peak Grad:      17.1 mmHg AV Mean Grad:      9.0 mmHg LVOT Vmax:         93.90 cm/s LVOT Vmean:        61.900 cm/s LVOT VTI:          0.225 m LVOT/AV VTI ratio: 0.44  AORTA Ao Root diam: 2.30 cm Ao Asc diam:  3.10 cm MITRAL VALVE                TRICUSPID VALVE MV Area (PHT): 3.72 cm     TR Peak grad:   22.8 mmHg MV Decel Time: 204 msec     TR Vmax:        239.00 cm/s MR Peak grad: 87.6 mmHg MR Vmax:      468.00 cm/s   SHUNTS MV E velocity: 1.37 cm/s    Systemic VTI:  0.22 m MV A velocity: 106.00 cm/s  Systemic Diam: 1.70 cm MV E/A ratio:  0.01 Chilton Si MD Electronically signed by Chilton Si MD Signature Date/Time: 01/09/2024/4:04:13 PM    Final    LOS: 2 days  Total time spent in review of labs and imaging, patient evaluation, formulation of plan, documentation and communication with family: 35 minutes  Lanae Boast, MD  [facility]  01/11/2024, 11:19 AM

## 2024-01-11 NOTE — Evaluation (Signed)
 Occupational Therapy Evaluation Patient Details Name: Zoe [name] MRN: [id number] DOB: [birth year] Today's Date: 01/11/2024   History of Present Illness   89 y.o. female admitted 3/18 with syncopal episode, described as -while changing her gown sudden loss of balance and fell onto the floor hitting her head, may have lost her consciousness.PAcemaker placed 3/19.  PMH: multiple myeloma, status post TAVR, paroxysmal atrial fibrillation, diastolic CHF, possible mild coronary angio spasm per cardiac cath in 2014, history of breast cancer status post lumpectomy in 2000, history of embolic CVA     Clinical Impressions Pt currently at mod assist for selfcare sit to stand with min assist for sit to stand and stepping up the EOB.  Limited secondary to dizziness and chest pain that's still persistent.  BP in sitting initially at 174/77 and then increasing up to 152/103 after sitting for a few mins.  Oxygen sats stable on room air throughout at 95% or better with HR around 85 BPM.  Prior to admission pt's grandson lived with her and assisted with some homemanagement, however she was independent with most ADLs.  Feel based on current findings, pt will benefit from acute care OT at this time in order to increase overall independence level.  Recommend continued inpatient follow up therapy, <3 hours/day post acute stay to progress back to modified independent level.       If plan is discharge home, recommend the following:   A little help with walking and/or transfers;A little help with bathing/dressing/bathroom;Assistance with cooking/housework;Assist for transportation;Help with stairs or ramp for entrance     Functional Status Assessment   Patient has had a recent decline in their functional status and demonstrates the ability to make significant improvements in function in a reasonable and predictable amount of time.     Equipment Recommendations   Other (comment) (TBD next venue of  care)      Precautions/Restrictions   Precautions Precautions: Fall Precaution/Restrictions Comments: Pacemaker Required Braces or Orthoses: Sling Restrictions Other Position/Activity Restrictions: Decr use left UE due to pacemaker precautions per protocol     Mobility Bed Mobility Overal bed mobility: Needs Assistance Bed Mobility: Rolling, Sidelying to Sit, Sit to Supine Rolling: Min assist Sidelying to sit: Min assist   Sit to supine: Min assist   General bed mobility comments: Min assist for rolling to the left side and then transitioning to sitting.  Min assist for bringing the LEs back in the bed and adjusting to the middle.    Transfers Overall transfer level: Needs assistance Equipment used: None Transfers: Sit to/from Stand, Bed to chair/wheelchair/BSC Sit to Stand: Min assist     Step pivot transfers: Min assist     General transfer comment: Pt able to complete sit to stand and take 2 small steps up the EOB with min assist.  Limited secondary to dizziness.      Balance Overall balance assessment: Needs assistance Sitting-balance support: Feet supported Sitting balance-Leahy Scale: Fair     Standing balance support: During functional activity Standing balance-Leahy Scale: Poor Standing balance comment: Pt needs unilateral UE support                           ADL either performed or assessed with clinical judgement   ADL Overall ADL's : Needs assistance/impaired Eating/Feeding: Independent;Sitting   Grooming: Minimal assistance;Sitting   Upper Body Bathing: Minimal assistance;Sitting   Lower Body Bathing: Moderate assistance;Sit to/from stand Lower Body Bathing Details (indicate cue type  and reason): simulated Upper Body Dressing : Maximal assistance;Sitting Upper Body Dressing Details (indicate cue type and reason): for donning sling Lower Body Dressing: Moderate assistance;Sit to/from stand Lower Body Dressing Details (indicate cue  type and reason): simulated Toilet Transfer: Minimal assistance;Stand-pivot Toilet Transfer Details (indicate cue type and reason): simulated walking up EOB Toileting- Clothing Manipulation and Hygiene: Minimal assistance;Sit to/from stand       Functional mobility during ADLs: Minimal assistance (stepping up the EOB) General ADL Comments: Pt with BP in sitting initially 174/77 with reports of some dizziness and chest pain.  HR in the 80s with O2 sats at 95% on room air.  Provided handout on pacemaker precautions and reviewed.  Assisted pt with donning and doffing sling as well as completion of partial scaption movements for 5 reps with min assist on the LUE 0-80 degrees per precautions.  Pt reporting increased dizziness so BP taken again at 152/103.  Limited session secondary to increased pain and elevated diastolic blood pressure.  Nursing made aware.  Pt reporting wanting to go to rehab somewhere since her grandson cannot provide 24 hour post discharge.     Vision Baseline Vision/History: 1 Wears glasses;6 Macular Degeneration Vision Assessment?: Wears glasses for reading     Perception Perception: Within Functional Limits       Praxis Praxis: WFL       Pertinent Vitals/Pain Pain Assessment Pain Assessment: 0-10 Pain Score: 7  Pain Location: chest Pain Descriptors / Indicators: Aching, Discomfort, Grimacing Pain Intervention(s): Limited activity within patient's tolerance, Monitored during session     Extremity/Trunk Assessment Upper Extremity Assessment Upper Extremity Assessment: LUE deficits/detail LUE Deficits / Details: Pt in sling secondary to pacemaker precautions.  Able to flex shoulder to just below 80 degress actively.  Elbow AROM and strength 3+/5 noted with functional use.   Lower Extremity Assessment Lower Extremity Assessment: Defer to PT evaluation   Cervical / Trunk Assessment Cervical / Trunk Assessment: Normal   Communication  Communication Communication: Impaired Factors Affecting Communication: Hearing impaired   Cognition Arousal: Alert Behavior During Therapy: WFL for tasks assessed/performed                                 Following commands: Intact                         OT Problem List: Pain;Decreased strength;Decreased activity tolerance;Impaired balance (sitting and/or standing);Decreased knowledge of use of DME or AE;Decreased range of motion   OT Treatment/Interventions: Self-care/ADL training;Patient/family education;Balance training;Therapeutic activities;DME and/or AE instruction      OT Goals(Current goals can be found in the care plan section)   Acute Rehab OT Goals Patient Stated Goal: Pt did not state but hopes to go to rehab. OT Goal Formulation: With patient Time For Goal Achievement: 01/25/24 Potential to Achieve Goals: Good   OT Frequency:  Min 2X/week       AM-PAC OT "6 Clicks" Daily Activity     Outcome Measure Help from another person eating meals?: None Help from another person taking care of personal grooming?: A Little Help from another person toileting, which includes using toliet, bedpan, or urinal?: A Little Help from another person bathing (including washing, rinsing, drying)?: A Little Help from another person to put on and taking off regular upper body clothing?: A Lot Help from another person to put on and taking off regular lower body clothing?: A  Lot 6 Click Score: 17   End of Session Nurse Communication: Other (comment) (BP issues and dizziness)  Activity Tolerance: Other (comment) (Limted secondary to dizziness, pain, and BP) Patient left: in bed;with call bell/phone within reach;with bed alarm set;with family/visitor present  OT Visit Diagnosis: Unsteadiness on feet (R26.81);Muscle weakness (generalized) (M62.81);Repeated falls (R29.6);Pain Pain - Right/Left:  (chest)                Time: 1610-9604 OT Time Calculation (min): 46  min Charges:  OT General Charges $OT Visit: 1 Visit OT Evaluation $OT Eval Moderate Complexity: 1 Mod OT Treatments $Self Care/Home Management : 23-37 mins  Perrin Maltese, OTR/L Acute Rehabilitation Services  Office [phone] 01/11/2024

## 2024-01-11 NOTE — Progress Notes (Signed)
 Physical Therapy Treatment Patient Details Name: Zoe [name] MRN: [id number] DOB: 11/25/1933 Today's Date: 01/11/2024   History of Present Illness 89 y.o. female admitted 3/18 with syncopal episode, described as -while changing her gown sudden loss of balance and fell onto the floor hitting her head, may have lost her consciousness.PAcemaker placed 3/19.  PMH: multiple myeloma, status post TAVR, paroxysmal atrial fibrillation, diastolic CHF, possible mild coronary angio spasm per cardiac cath in 2014, history of breast cancer status post lumpectomy in 2000, history of embolic CVA    PT Comments  Pt greeted supine in bed, pleasant and agreeable to PT session. Reviewed pacemaker precautions with pt using handout left in her room. She required frequent VC to abide by precautions and increase safety awareness during session. Pt advanced OOB mobility by engaging in two bouts of ambulation using RW with CGA and a chair follow. She was willing to stay up in the recliner chair at end of session. Will continue to follow acutely and advance appropriately.    If plan is discharge home, recommend the following: A little help with walking and/or transfers;A little help with bathing/dressing/bathroom;Assistance with cooking/housework;Help with stairs or ramp for entrance;Assist for transportation   Can travel by private vehicle     Yes  Equipment Recommendations  None recommended by PT    Recommendations for Other Services       Precautions / Restrictions Precautions Precautions: Fall;ICD/Pacemaker Recall of Precautions/Restrictions: Impaired Required Braces or Orthoses: Sling (LUE) Restrictions Weight Bearing Restrictions Per Provider Order: No     Mobility  Bed Mobility Overal bed mobility: Needs Assistance Bed Mobility: Rolling, Sidelying to Sit, Sit to Supine Rolling: Min assist Sidelying to sit: HOB elevated, Min assist       General bed mobility comments: Pt sat up on R side of  bed using the log roll technique, required minA and use of bed pad to achieve upright posture. Required VC to decrease use of LUE d/t pacemaker precautions.    Transfers Overall transfer level: Needs assistance Equipment used: Rolling walker (2 wheels) Transfers: Sit to/from Stand Sit to Stand: Min assist           General transfer comment: Pt stood from lowest bed height and recliner chair by pushing up with RUE from the surface she was on and minA to power up into standing. Pt rested LUE on RW, limited WBing. VC throughout for increased awareness fo pacemaker precautions. Cued pt to reach back with RUE towards the surface she was going to, good eccentric control with sitting.    Ambulation/Gait Ambulation/Gait assistance: Contact guard assist, +2 safety/equipment (Chair Follow) Gait Distance (Feet): 15 Feet (1x15, prolonged seated rest, 1x8) Assistive device: Rolling walker (2 wheels) Gait Pattern/deviations: Step-to pattern, Decreased step length - right, Decreased step length - left, Shuffle, Trunk flexed Gait velocity: reduced Gait velocity interpretation: <1.31 ft/sec, indicative of household ambulator   General Gait Details: Pt ambulated very slowly and cautiously with short, small steps, lacking floor clearence. She maintained fwd flex posture over RW. VC/TC to correct gait mechanics with no adjustments noted.   Stairs             Wheelchair Mobility     Tilt Bed    Modified Rankin (Stroke Patients Only)       Balance Overall balance assessment: Needs assistance Sitting-balance support: Feet supported Sitting balance-Leahy Scale: Fair     Standing balance support: During functional activity, Bilateral upper extremity supported, Reliant on assistive device for balance  Standing balance-Leahy Scale: Poor Standing balance comment: Pt dependent on RW for stability when upright.                            Communication  Communication Communication: Impaired Factors Affecting Communication: Hearing impaired  Cognition Arousal: Alert Behavior During Therapy: Anxious   PT - Cognitive impairments: No apparent impairments                       PT - Cognition Comments: Pt is very fearful of passing out during mobility. Following commands: Intact      Cueing Cueing Techniques: Verbal cues, Gestural cues  Exercises      General Comments General comments (skin integrity, edema, etc.): Pt c/o dizziness intermittently during session. BP: seated EOB 156/69 (93), seated post-gait 116/101 (108), end of session in recliner chair with feet elevated 143/72 (94). Pt continues to require 3L O2 to maintain SpO2 >88% and       Pertinent Vitals/Pain Pain Assessment Pain Assessment: Faces Faces Pain Scale: Hurts little more Pain Location: chest Pain Descriptors / Indicators: Aching, Discomfort, Grimacing, Guarding, Tender Pain Intervention(s): Monitored during session, Limited activity within patient's tolerance    Home Living                          Prior Function            PT Goals (current goals can now be found in the care plan section) Acute Rehab PT Goals Patient Stated Goal: Not have this pain anymore. Progress towards PT goals: Progressing toward goals    Frequency    Min 2X/week      PT Plan      Co-evaluation              AM-PAC PT "6 Clicks" Mobility   Outcome Measure  Help needed turning from your back to your side while in a flat bed without using bedrails?: A Lot Help needed moving from lying on your back to sitting on the side of a flat bed without using bedrails?: A Lot Help needed moving to and from a bed to a chair (including a wheelchair)?: A Little Help needed standing up from a chair using your arms (e.g., wheelchair or bedside chair)?: A Little Help needed to walk in hospital room?: A Little Help needed climbing 3-5 steps with a railing? : A  Lot 6 Click Score: 15    End of Session Equipment Utilized During Treatment: Gait belt;Oxygen Activity Tolerance: Patient limited by fatigue Patient left: in chair;with call bell/phone within reach;with chair alarm set Nurse Communication: Mobility status PT Visit Diagnosis: History of falling (Z91.81);Other abnormalities of gait and mobility (R26.89)     Time: 9147-8295 PT Time Calculation (min) (ACUTE ONLY): 28 min  Charges:    $Gait Training: 23-37 mins PT General Charges $$ ACUTE PT VISIT: 1 Visit                     Cheri Guppy, PT, DPT Acute Rehabilitation Services Office: [phone] Secure Chat Preferred  Zoe [name] 01/11/2024, 4:48 PM

## 2024-01-11 NOTE — Care Management Important Message (Signed)
 Important Message  Patient Details  Name: Zoe [name] MRN: [id number] Date of Birth: 1933-11-21   Important Message Given:  Yes - Medicare IM     Zoe [name] 01/11/2024, 9:59 AM

## 2024-01-12 DIAGNOSIS — I459 Conduction disorder, unspecified: Secondary | ICD-10-CM | POA: Diagnosis not present

## 2024-01-12 MED ORDER — IBUPROFEN 200 MG PO TABS
600.0000 mg | ORAL_TABLET | Freq: Three times a day (TID) | ORAL | Status: DC | PRN
  Administered 2024-01-12: 600 mg via ORAL
  Filled 2024-01-12: qty 3

## 2024-01-12 MED ORDER — POLYETHYLENE GLYCOL 3350 17 G PO PACK
17.0000 g | PACK | Freq: Every day | ORAL | Status: DC | PRN
  Administered 2024-01-12 – 2024-01-13 (×2): 17 g via ORAL
  Filled 2024-01-12 (×2): qty 1

## 2024-01-12 MED ORDER — KETOROLAC TROMETHAMINE 15 MG/ML IJ SOLN
15.0000 mg | Freq: Once | INTRAMUSCULAR | Status: AC
  Administered 2024-01-12: 15 mg via INTRAVENOUS
  Filled 2024-01-12: qty 1

## 2024-01-12 MED ORDER — HYDROXYZINE HCL 25 MG PO TABS
25.0000 mg | ORAL_TABLET | Freq: Three times a day (TID) | ORAL | Status: DC | PRN
  Administered 2024-01-12 – 2024-01-14 (×3): 25 mg via ORAL
  Filled 2024-01-12 (×3): qty 1

## 2024-01-12 NOTE — Plan of Care (Signed)
 Pt c/o chest pain determined to be musculoskeletal pain. Pt given oxycodone twice during shift.

## 2024-01-12 NOTE — Progress Notes (Addendum)
 PROGRESS NOTE Zoe [name]  ZOX:[id number] DOB: 04-23-1934 DOA: 01/08/2024 PCP: Irena Reichmann, DO  Brief Narrative/Hospital Course: 89 y.o. female with history of multiple myeloma, status post TAVR, paroxysmal atrial fibrillation, diastolic CHF, possible mild coronary angio spasm per cardiac cath in 2014, history of breast cancer status post lumpectomy in 2000, history of embolic CVA was brought to the ER after patient had a syncopal episode, described as -while changing her gown sudden loss of balance and fell onto the floor hitting her head, may have lost her consciousness. In the ED workup unremarkable with CT head, C-spine and x-ray of the pelvis not showing anything acute.  Chest x-ray unremarkable EKG shows normal sinus rhythm.  No complaint of chest pain EKG sinus rhythm.  Troponin elevated :108>  102, was started on heparin infusion, Cardiology consulted and admitted for syncope and chest pain for evaluation. 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  Patient having ongoing chest pain but reproduced with palpation and has bruise on the sternal area suspected to be musculoskeletal pain due to her fall and also has elevated troponin postprocedure.  D-dimer were checked given patient's need for supplemental oxygen and unremarkable, suspect oxygen is due to patient's chest discomfort preventing lung expansion. Patient deconditioned and weak PT OT recommending skilled nursing facility  Consultation: Cardiology, EP cardiology  Significant procedures/imaging: Tte 3/19> EF 60-65%, G2 DD LA-severely dilated aortic valve repaired/replaced no aortic stenosis Dual-chamber pacemaker placement 3/19     Subjective: Patient seen and examined this morning  Reports chest pain that comes and goes  BP remains high this morning  Assessment and Plan: Principal Problem:   Syncope Active Problems:   Chest pain    Elevated troponin   Chronic diastolic CHF (congestive heart failure) (HCC)   Paroxysmal atrial fibrillation (HCC)   Hypertension   S/P TAVR (transcatheter aortic valve replacement)   Hyperlipidemia   Multiple myeloma (HCC)   Hypothyroidism   History of breast cancer, DCIS, lumpectomy March 2000   GERD (gastroesophageal reflux disease)   Paroxysmal atrial fibrillation with rapid ventricular response (HCC)   Vasospastic angina (HCC)   History of coronary vasospasm   Chronic anticoagulation   CKD (chronic kidney disease) stage 3, GFR 30-59 ml/min (HCC)   Heart block AV complete (HCC)   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  Continue post pacemaker precaution-follow-up outpatient.  Chest pain Elevated troponin:? Demand ischemia VS nstemi-likely from hypertension Emeregency Hx of Severe LVH History of TAVR: Normal coronaries in 2019, troponin elevated and trended up -flat seen by cardiology pain worse since procedure she says but has area of bruise and had a fall at home.  Per cardiology not concerned about cardiac pain likely musculoskeletal, of note D-dimer is negative and already on anticoagulation PTA low risk of VTE. CT chest no acute findings.  Cont prn ibuprofen, lidocaine and oxy for pain Echo fairly stable  Acute respiratory insufficiency SpO2 81% on room air placed on 2 L [place]. Likely from decreased respiratory effort due to chest pain on deep breath or tenderness.  D-dimer negative.  On RA now  Chronic diastolic CHF: Euvolemic. Echo stable plan per cardiology   Hypokalemia: Resolved  Hypothyroidism: Cont home Synthroid.  Multiple myeloma: on Revlimid followed by oncologist.  Paroxysmal atrial fibrillation: on amiodarone. Eliquis on hold AND TO Resume 01/15/24 per EP cardiology  Chronic kidney stage II: creatinine stable  Hypertension:  Hypertensive emergency on  admit: BP poorly controlled. Meds adjusted 3/21 on losartan 50, Toprol 50-monitor and adjust meds  Prior history of CVA HLD: Cont  statins and holding Eliquis post PPM  DVT prophylaxis:  Code Status:   Code Status: Full Code Family Communication: plan of care discussed with patient at bedside. Patient status is: Remains hospitalized because of severity of illness Level of care: Telemetry Cardiac   Dispo: The patient is from: HOME.            Anticipated disposition: snf tomorrow  if stable  Objective: Vitals last 24 hrs: Vitals:   01/11/24 1540 01/11/24 1921 01/11/24 1923 01/12/24 0743  BP: (!) 149/64 (!) 56/17 (!) 110/57 (!) 188/86  Pulse: (!) 59  61 66  Resp: 18  18 16   Temp: 97.9 F (36.6 C)  97.9 F (36.6 C) (!) 97.4 F (36.3 C)  TempSrc: Oral  Oral Oral  SpO2:    96%  Weight:      Height:       Weight change:   Physical Examination: General exam: alert awake, oriented  HEENT:Oral mucosa moist, Ear/Nose WNL grossly Respiratory system: Bilaterally clear BS,no use of accessory muscle Cardiovascular system: S1 & S2 +, No JVD. Gastrointestinal system: Abdomen soft,Tender chest ,ND, BS+.  Left chest wall with pacemaker in place with bruises Nervous System: Alert, awake, moving all extremities,and following commands. Extremities: LE edema neg,distal peripheral pulses palpable and warm.  Skin: No rashes,no icterus. MSK: Normal muscle bulk,tone, power   Medications reviewed:  Scheduled Meds:  acetaminophen  975 mg Oral TID   acyclovir  400 mg Oral BID   amiodarone  100 mg Oral Daily   levothyroxine  100 mcg Oral QAC breakfast   lidocaine  1 patch Transdermal Q24H   losartan  50 mg Oral Daily   metoprolol succinate  50 mg Oral Daily   pantoprazole  40 mg Oral Q0600   polyvinyl alcohol  1 drop Both Eyes QHS   pravastatin  40 mg Oral QPM   Continuous Infusions:    Diet Order             Diet Heart Room service appropriate? Yes; Fluid consistency: Thin  Diet  effective now                  Intake/Output Summary (Last 24 hours) at 01/12/2024 1027 Last data filed at 01/11/2024 1907 Gross per 24 hour  Intake --  Output 220 ml  Net -220 ml   Net IO Since Admission: 1,064.96 mL [01/12/24 1027]  Wt Readings from Last 3 Encounters:  01/11/24 73.9 kg  01/01/24 74.6 kg  11/02/23 73.7 kg     Unresulted Labs (From admission, onward)     Start     Ordered   01/09/24 1003  Surgical PCR screen  (Screening)  Once,   R        01/09/24 1002          Data Reviewed: I have personally reviewed following labs and imaging studies CBC: Recent Labs  Lab 01/08/24 1354 01/08/24 1413 01/09/24 0407 01/10/24 0905  WBC 7.1  --  5.8 6.3  HGB 12.7 13.3 11.7* 12.0  HCT 38.8 39.0 36.4 36.9  MCV 89.4  --  90.3 89.3  PLT 102*  --  124* 104*   Basic Metabolic Panel:  Recent Labs  Lab 01/08/24 1354 01/08/24 1413 01/09/24 0407 01/10/24 0905  NA 138 137 139 134*  K 3.8 3.6 3.2* 3.5  CL 99 98 106 100  CO2 28  --  27 25  GLUCOSE 130* 124* 91 145*  BUN 19 20 18 16   CREATININE 1.02* 1.00 0.89 1.01*  CALCIUM 8.8*  --  8.0* 8.2*   GFR: Estimated Creatinine Clearance: 37.1 mL/min (A) (by C-G formula based on SCr of 1.01 mg/dL (H)). Liver Function Tests:  Recent Labs  Lab 01/08/24 1354 01/10/24 0905  AST 22 21  ALT 15 14  ALKPHOS 70 63  BILITOT 0.7 1.1  PROT 5.8* 5.3*  ALBUMIN 3.2* 2.8*  No results for input(s): "LIPASE", "AMYLASE" in the last 168 hours. No results for input(s): "AMMONIA" in the last 168 hours. Coagulation Profile:  Recent Labs  Lab 01/08/24 1354  INR 1.3*   No results for input(s): "TSH", "T4TOTAL", "FREET4", "T3FREE", "THYROIDAB" in the last 72 hours.  Sepsis Labs: Recent Labs  Lab 01/08/24 1409 01/08/24 1629  LATICACIDVEN 2.2* 1.3   No results found for this or any previous visit (from the past 240 hours).  Antimicrobials/Microbiology: Anti-infectives (From admission, onward)    Start     Dose/Rate Route  Frequency Ordered Stop   01/10/24 0600  vancomycin (VANCOCIN) IVPB 1000 mg/200 mL premix        1,000 mg 200 mL/hr over 60 Minutes Intravenous  Once 01/09/24 1830 01/10/24 0739   01/09/24 1618  vancomycin (VANCOCIN) IVPB 1000 mg/200 mL premix        over 60 Minutes  Continuous PRN 01/09/24 1619 01/09/24 1739   01/09/24 1533  ceFAZolin (ANCEF) 2-4 GM/100ML-% IVPB       Note to Pharmacy: Zadie Cleverly B: cabinet override      01/09/24 1533 01/10/24 0344   01/09/24 1533  sodium chloride 0.9 % with gentamicin (GARAMYCIN) ADS Med       Note to Pharmacy: Zadie Cleverly B: cabinet override      01/09/24 1533 01/10/24 0344   01/09/24 1230  vancomycin (VANCOCIN) IVPB 1000 mg/200 mL premix        1,000 mg 200 mL/hr over 60 Minutes Intravenous On call 01/09/24 1227 01/10/24 1230   01/09/24 1015  gentamicin (GARAMYCIN) 80 mg in sodium chloride 0.9 % 500 mL irrigation        80 mg Irrigation On call 01/09/24 1002 01/09/24 1658   01/09/24 1015  ceFAZolin (ANCEF) IVPB 2g/100 mL premix  Status:  Discontinued        2 g 200 mL/hr over 30 Minutes Intravenous On call 01/09/24 1002 01/09/24 1227   01/09/24 1000  acyclovir (ZOVIRAX) tablet 400 mg        400 mg Oral 2 times daily 01/09/24 0043           Component Value Date/Time   SDES  08/17/2023 1000    URINE, CLEAN CATCH Performed at [facility], [address], [place], [place] [address]    SPECREQUEST  08/17/2023 1000    URINE, CLEAN CATCH Performed at [facility], [address] [place], [place] [address]    CULT (A) 08/17/2023 1000    <10,000 COLONIES/mL INSIGNIFICANT GROWTH Performed at [facility], [address], [place], [place] [address]    REPTSTATUS 08/18/2023 FINAL 08/17/2023 1000     Radiology Studies: CT CHEST WO CONTRAST Result Date: 01/11/2024 CLINICAL DATA:  Chest pain after fall. EXAM: CT CHEST WITHOUT CONTRAST TECHNIQUE: Multidetector CT imaging of the chest  was performed following the standard protocol without IV contrast. RADIATION DOSE REDUCTION:  This exam was performed according to the departmental dose-optimization program which includes automated exposure control, adjustment of the mA and/or kV according to patient size and/or use of iterative reconstruction technique. COMPARISON:  August 15, 2018. FINDINGS: Cardiovascular: Status post transcatheter aortic valve repair. Atherosclerosis of thoracic aorta is noted without aneurysm formation. Mild cardiomegaly. No pericardial effusion. Mediastinum/Nodes: Status post left thyroidectomy. No adenopathy. Esophagus is unremarkable. Lungs/Pleura: No pneumothorax or pleural effusion is noted. Minimal bilateral posterior basilar subsegmental atelectasis is noted. Minimal subsegmental atelectasis is noted posteriorly in left upper lobe. Upper Abdomen: Stable right hepatic low density is noted consistent with hemangioma. Musculoskeletal: Status post kyphoplasty is noted at 2 levels of lower thoracic spine. No acute osseous abnormality is noted. IMPRESSION: Minimal bilateral posterior basilar subsegmental atelectasis is noted. Minimal subsegmental atelectasis is noted posteriorly in left upper lobe. Status post transcatheter aortic valve repair. Aortic Atherosclerosis (ICD10-I70.0). Electronically Signed   By: Lupita Raider M.D.   On: 01/11/2024 13:49   LOS: 3 days  Total time spent in review of labs and imaging, patient evaluation, formulation of plan, documentation and communication with family: 35 minutes  Lanae Boast, MD  [facility]  01/12/2024, 10:27 AM

## 2024-01-12 NOTE — Progress Notes (Addendum)
 [facility] night cross cover note:   I was notified by RN that the patient's chest discomfort that is refractory to existing order for prn oxycodone.  Similar chest pain to that which she has been experiencing intermittently throughout this hospitalization.  Per chart review, cardiology suspects that this is of musculoskeletal etiology.  I subsequently ordered a one-time dose of Toradol 15 mg IV x 1 dose now.   Update: CP improved with aforementioned dose of IV Toradol.  Patient now complaining of some anxiety, and requesting medication to address this.  I subsequently added prn hydroxyzine for anxiety.    Newton Pigg, DO Hospitalist

## 2024-01-12 NOTE — Plan of Care (Signed)
   Problem: Activity: Goal: Risk for activity intolerance will decrease Outcome: Progressing

## 2024-01-12 NOTE — Progress Notes (Signed)
 No new recommendations.  Periodic pleuritic/musculoskeletal type chest pain evaluated by EP team post pacemaker insertion.  Please let us know if we can be of further assistance.  We will go ahead and sign off.  Donato Schultz, MD

## 2024-01-13 DIAGNOSIS — I459 Conduction disorder, unspecified: Secondary | ICD-10-CM | POA: Diagnosis not present

## 2024-01-13 MED ORDER — BISACODYL 10 MG RE SUPP
10.0000 mg | Freq: Once | RECTAL | Status: AC
  Administered 2024-01-13: 10 mg via RECTAL
  Filled 2024-01-13: qty 1

## 2024-01-13 MED ORDER — ORAL CARE MOUTH RINSE: 15.0000 mL | OROMUCOSAL | Status: DC | PRN

## 2024-01-13 NOTE — Plan of Care (Signed)
  Problem: Education: Goal: Knowledge of General Education information will improve Description: Including pain rating scale, medication(s)/side effects and non-pharmacologic comfort measures Outcome: Progressing   Problem: Clinical Measurements: Goal: Will remain free from infection Outcome: Progressing   Problem: Education: Goal: Knowledge of cardiac device and self-care will improve Outcome: Not Progressing   Problem: Clinical Measurements: Goal: Ability to maintain clinical measurements within normal limits will improve Outcome: Not Progressing Goal: Respiratory complications will improve Outcome: Not Progressing Goal: Cardiovascular complication will be avoided Outcome: Not Progressing   Problem: Pain Managment: Goal: General experience of comfort will improve and/or be controlled Outcome: Not Progressing

## 2024-01-13 NOTE — Plan of Care (Signed)
   Problem: Activity: Goal: Risk for activity intolerance will decrease Outcome: Progressing

## 2024-01-13 NOTE — TOC Progression Note (Signed)
 Transition of Care [facility]) - Progression Note    Patient Details  Name: Zoe [name] MRN: [id number] Date of Birth: Feb 13, 1934  [facility] CM/SW Contact  Patrice Paradise, LCSW Phone Number: 01/13/2024, 12:43 PM  Clinical Narrative:     CSW spoke with April at [facility] in regards to possible DC today. April explained that DC summary would need to be in as well as asking about pt's chest pain. CSW relayed information to MD. Planning for a DC tomorrow.  TOC team will continue to assist with discharge planning needs.   Expected Discharge Plan: Skilled Nursing Facility Barriers to Discharge: Continued Medical Work up, SNF Pending bed offer, English as a second language teacher  Expected Discharge Plan and Services In-house Referral: Clinical Social Work     Living arrangements for the past 2 months: Single Family Home                                       Social Determinants of Health (SDOH) Interventions SDOH Screenings   Food Insecurity: No Food Insecurity (01/09/2024)  Housing: Low Risk  (01/09/2024)  Transportation Needs: No Transportation Needs (01/09/2024)  Utilities: Not At Risk (01/09/2024)  Depression (PHQ2-9): Low Risk  (11/18/2018)  Financial Resource Strain: Low Risk  (11/12/2018)  Physical Activity: Inactive (11/12/2018)  Social Connections: Moderately Isolated (01/09/2024)  Stress: Stress Concern Present (11/12/2018)  Tobacco Use: Low Risk  (01/09/2024)    Readmission Risk Interventions     No data to display

## 2024-01-13 NOTE — Progress Notes (Signed)
 PROGRESS NOTE Zoe [name]  JXB:[id number] DOB: [birth year] DOA: 01/08/2024 PCP: Irena Reichmann, DO  Brief Narrative/Hospital Course: 89 y.o. female with history of multiple myeloma, status post TAVR, paroxysmal atrial fibrillation, diastolic CHF, possible mild coronary angio spasm per cardiac cath in 2014, history of breast cancer status post lumpectomy in 2000, history of embolic CVA was brought to the ER after patient had a syncopal episode, described as -while changing her gown sudden loss of balance and fell onto the floor hitting her head, may have lost her consciousness. In the ED workup unremarkable with CT head, C-spine and x-ray of the pelvis not showing anything acute.  Chest x-ray unremarkable EKG shows normal sinus rhythm.  No complaint of chest pain EKG sinus rhythm.  Troponin elevated :108>  102, was started on heparin infusion, Cardiology consulted and admitted for syncope and chest pain for evaluation. 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  Patient having ongoing chest pain but reproduced with palpation and has bruise on the sternal area suspected to be musculoskeletal pain due to her fall and also has elevated troponin postprocedure.  D-dimer were checked given patient's need for supplemental oxygen and unremarkable, suspect oxygen is due to patient's chest discomfort preventing lung expansion. Patient deconditioned and weak PT OT recommending skilled nursing facility  Consultation: Cardiology, EP cardiology  Significant procedures/imaging: Tte 3/19> EF 60-65%, G2 DD LA-severely dilated aortic valve repaired/replaced no aortic stenosis Dual-chamber pacemaker placement 3/19     Subjective: Seen and examined this morning  Alert oriented  Complains of intermittent chest pain  Earlier evaluated by cardiology regarding her chest pain-and she had taken nitroglycerin  Assessment and  Plan: Principal Problem:   Syncope Active Problems:   Chest pain   Elevated troponin   Chronic diastolic CHF (congestive heart failure) (HCC)   Paroxysmal atrial fibrillation (HCC)   Hypertension   S/P TAVR (transcatheter aortic valve replacement)   Hyperlipidemia   Multiple myeloma (HCC)   Hypothyroidism   History of breast cancer, DCIS, lumpectomy March 2000   GERD (gastroesophageal reflux disease)   Paroxysmal atrial fibrillation with rapid ventricular response (HCC)   Vasospastic angina (HCC)   History of coronary vasospasm   Chronic anticoagulation   CKD (chronic kidney disease) stage 3, GFR 30-59 ml/min (HCC)   Heart block AV complete (HCC)   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 Continue post pacemaker precaution-follow-up outpatient.  Chest pain Elevated troponin:? Demand ischemia VS nstemi-likely from hypertension Emeregency Hx of Severe LVH History of TAVR: Normal coronaries in 2019, troponin elevated and trended up and felt to be post pacemaker troponin bump Has ongoing intermittent chest pain reproducible with palpation. D-dimer was negative. On Tylenol 3 times daily, lidocaine patch and oxy for severe pain Received nitroglycerin with mild improvement-reevaluated by cardiology today-recommending no further ischemic workup and continue pain management for musculoskeletal chest pain.  Echo stable. CT chest no acute findings. Patient reassured.  Acute respiratory insufficiency SpO2 81% on room air placed on 2 L [place]. Resolved   Chronic diastolic CHF: Euvolemic. Echo stable Monitor volume status Hypokalemia: Resolved  Hypothyroidism: Cont home Synthroid.  Multiple myeloma: on Revlimid followed by oncologist.  Paroxysmal atrial fibrillation: Continue amiodarone. Eliquis on hold post PM -to resume 01/15/24 per EP cardiology  Chronic kidney stage  II: creatinine stable.  Monitor intermittently  Hypertension: Hypertensive emergency on admit: BP poorly controlled-meds adjusted 3/21 on losartan  50, Toprol 50.  Currently stable  Prior history of CVA HLD: Cont  statins and holding Eliquis post PPM  DVT prophylaxis:  Code Status:   Code Status: Full Code Family Communication: plan of care discussed with patient at bedside. Patient status is: Remains hospitalized because of severity of illness Level of care: Telemetry Cardiac   Dispo: The patient is from: HOME.            Anticipated disposition: Plan for discharge to skilled nursing facility tomorrow   Objective: Vitals last 24 hrs: Vitals:   01/13/24 0526 01/13/24 0527 01/13/24 0822 01/13/24 1124  BP: (!) 109/47  (!) 159/69 136/68  Pulse:   72 61  Resp: 20 20 17 11   Temp:   97.6 F (36.4 C) 97.8 F (36.6 C)  TempSrc:   Oral Oral  SpO2: 100%  100% 100%  Weight:      Height:       Weight change:   Physical Examination: General exam: alert awake, oriented at baseline, older than stated age HEENT:Oral mucosa moist, Ear/Nose WNL grossly Respiratory system: Bilaterally clear BS,no use of accessory muscle Cardiovascular system: S1 & S2 +, No JVD.  Pacemaker in place dressing intact.  Tender chest sternal area Gastrointestinal system: Abdomen soft,NT,ND, BS+ Nervous System: Alert, awake, moving all extremities,and following commands. Extremities: LE edema neg,distal peripheral pulses palpable and warm.  Skin: No rashes,no icterus. MSK: Normal muscle bulk,tone, power   Medications reviewed:  Scheduled Meds:  acetaminophen  975 mg Oral TID   acyclovir  400 mg Oral BID   amiodarone  100 mg Oral Daily   levothyroxine  100 mcg Oral QAC breakfast   lidocaine  1 patch Transdermal Q24H   losartan  50 mg Oral Daily   metoprolol succinate  50 mg Oral Daily   pantoprazole  40 mg Oral Q0600   polyvinyl alcohol  1 drop Both Eyes QHS   pravastatin  40 mg Oral QPM   Continuous  Infusions:    Diet Order             Diet Heart Room service appropriate? Yes; Fluid consistency: Thin  Diet effective now                  Intake/Output Summary (Last 24 hours) at 01/13/2024 1434 Last data filed at 01/13/2024 0517 Gross per 24 hour  Intake 240 ml  Output 400 ml  Net -160 ml   Net IO Since Admission: 844.96 mL [01/13/24 1434]  Wt Readings from Last 3 Encounters:  01/13/24 74.5 kg  01/01/24 74.6 kg  11/02/23 73.7 kg     Unresulted Labs (From admission, onward)     Start     Ordered   01/09/24 1003  Surgical PCR screen  (Screening)  Once,   R        01/09/24 1002          Data Reviewed: I have personally reviewed following labs and imaging studies CBC: Recent Labs  Lab 01/08/24 1354 01/08/24 1413 01/09/24 0407 01/10/24 0905  WBC 7.1  --  5.8 6.3  HGB 12.7 13.3 11.7* 12.0  HCT 38.8 39.0 36.4 36.9  MCV 89.4  --  90.3 89.3  PLT 102*  --  124* 104*   Basic Metabolic Panel:  Recent Labs  Lab 01/08/24 1354 01/08/24 1413 01/09/24 0407 01/10/24 0905  NA 138 137 139 134*  K 3.8 3.6 3.2* 3.5  CL 99 98 106 100  CO2 28  --  27 25  GLUCOSE 130* 124* 91 145*  BUN 19 20 18 16   CREATININE 1.02* 1.00 0.89 1.01*  CALCIUM 8.8*  --  8.0* 8.2*   GFR: Estimated Creatinine Clearance: 37.2 mL/min (A) (by C-G formula based on SCr of 1.01 mg/dL (H)). Liver Function Tests:  Recent Labs  Lab 01/08/24 1354 01/10/24 0905  AST 22 21  ALT 15 14  ALKPHOS 70 63  BILITOT 0.7 1.1  PROT 5.8* 5.3*  ALBUMIN 3.2* 2.8*  No results for input(s): "LIPASE", "AMYLASE" in the last 168 hours. No results for input(s): "AMMONIA" in the last 168 hours. Coagulation Profile:  Recent Labs  Lab 01/08/24 1354  INR 1.3*   No results for input(s): "TSH", "T4TOTAL", "FREET4", "T3FREE", "THYROIDAB" in the last 72 hours.  Sepsis Labs: Recent Labs  Lab 01/08/24 1409 01/08/24 1629  LATICACIDVEN 2.2* 1.3   No results found for this or any previous visit (from the past 240  hours).  Antimicrobials/Microbiology: Anti-infectives (From admission, onward)    Start     Dose/Rate Route Frequency Ordered Stop   01/10/24 0600  vancomycin (VANCOCIN) IVPB 1000 mg/200 mL premix        1,000 mg 200 mL/hr over 60 Minutes Intravenous  Once 01/09/24 1830 01/10/24 0739   01/09/24 1618  vancomycin (VANCOCIN) IVPB 1000 mg/200 mL premix        over 60 Minutes  Continuous PRN 01/09/24 1619 01/09/24 1739   01/09/24 1533  ceFAZolin (ANCEF) 2-4 GM/100ML-% IVPB       Note to Pharmacy: Zadie Cleverly B: cabinet override      01/09/24 1533 01/10/24 0344   01/09/24 1533  sodium chloride 0.9 % with gentamicin (GARAMYCIN) ADS Med       Note to Pharmacy: Zadie Cleverly B: cabinet override      01/09/24 1533 01/10/24 0344   01/09/24 1230  vancomycin (VANCOCIN) IVPB 1000 mg/200 mL premix        1,000 mg 200 mL/hr over 60 Minutes Intravenous On call 01/09/24 1227 01/10/24 1230   01/09/24 1015  gentamicin (GARAMYCIN) 80 mg in sodium chloride 0.9 % 500 mL irrigation        80 mg Irrigation On call 01/09/24 1002 01/09/24 1658   01/09/24 1015  ceFAZolin (ANCEF) IVPB 2g/100 mL premix  Status:  Discontinued        2 g 200 mL/hr over 30 Minutes Intravenous On call 01/09/24 1002 01/09/24 1227   01/09/24 1000  acyclovir (ZOVIRAX) tablet 400 mg        400 mg Oral 2 times daily 01/09/24 0043           Component Value Date/Time   SDES  08/17/2023 1000    URINE, CLEAN CATCH Performed at [facility], [address], [place], [place] [address]    SPECREQUEST  08/17/2023 1000    URINE, CLEAN CATCH Performed at [facility], [address] [place], [place] [address]    CULT (A) 08/17/2023 1000    <10,000 COLONIES/mL INSIGNIFICANT GROWTH Performed at [facility], [address] [place], [place] [address]    REPTSTATUS 08/18/2023 FINAL 08/17/2023 1000     Radiology Studies: No results found.  LOS: 4 days  Total time spent in  review of labs and imaging, patient evaluation, formulation of plan, documentation and communication with family: 35 minutes  Lanae Boast, MD  [facility]  01/13/2024, 2:34 PM

## 2024-01-13 NOTE — Progress Notes (Signed)
 The patient c/o chest pain of 10/10 after taking Oxycodone 5 mg PRN. NTG 0.4 mg SL administered  x 2 and she stated her pain level as 5 or 6. She refused to take a 3rd NTG. She's now resting  in bed quietly at this moment.  Will continue to monitor.

## 2024-01-13 NOTE — Progress Notes (Signed)
   Patient Name: Zoe [name] Date of Encounter: 01/13/2024 [facility] Cardiologist: Yates Decamp, MD    Interval Summary  .    She continues to have intermittent lower sternal/epigastric area chest pain that can be brought on by positional changes and is tender to palpation.   Vital Signs .    Vitals:   01/13/24 0526 01/13/24 0527 01/13/24 0822 01/13/24 1124  BP: (!) 109/47  (!) 159/69 136/68  Pulse:   72 61  Resp: 20 20 17 11   Temp:   97.6 F (36.4 C) 97.8 F (36.6 C)  TempSrc:   Oral Oral  SpO2: 100%  100% 100%  Weight:      Height:        Intake/Output Summary (Last 24 hours) at 01/13/2024 1349 Last data filed at 01/13/2024 0517 Gross per 24 hour  Intake 240 ml  Output 400 ml  Net -160 ml      01/13/2024    5:07 AM 01/11/2024    6:25 AM 01/10/2024    4:07 AM  Last 3 Weights  Weight (lbs) 164 lb 3.9 oz 162 lb 14.7 oz 163 lb 5.8 oz  Weight (kg) 74.5 kg 73.9 kg 74.1 kg      Telemetry/ECG    A paced - Personally Reviewed  Physical Exam .   GEN: No acute distress.   Chest: +tenderness to palpation over lower sternal/epigastric area which reproduces her pain Pacer site: steri strips intact; no significant hematoma; +ecchymosis noted  Assessment & Plan .     89 y.o. female with parox AF, severe AS s/p TAVR, HFpEF, multiple myeloma, breast CA, prior embolic CVA who was admitted with syncope in the setting of complete heart block with pauses over 5 seconds. She is s/p PPM implant. She has had continued MSK chest pain since her implant. Her hsTroponins have been elevated c/w post pacer implant. She continues to have chest pain that is non-cardiac. Her pain seems to be MSK in nature, is made worse with positional changes and palpation. I have advised her to not take NTG for this type of pain. She can use heat, acetaminophen or, if severe, oxycodone.  Please call with questions.   For questions or updates, please contact  [facility] Please consult  [email or web address] for contact info under    Signed, Tereso Newcomer, PA-C

## 2024-01-14 ENCOUNTER — Ambulatory Visit: Payer: Medicare Other

## 2024-01-14 ENCOUNTER — Other Ambulatory Visit: Payer: Self-pay | Admitting: *Deleted

## 2024-01-14 DIAGNOSIS — R531 Weakness: Secondary | ICD-10-CM | POA: Diagnosis not present

## 2024-01-14 DIAGNOSIS — I459 Conduction disorder, unspecified: Secondary | ICD-10-CM | POA: Diagnosis not present

## 2024-01-14 DIAGNOSIS — Z950 Presence of cardiac pacemaker: Secondary | ICD-10-CM | POA: Diagnosis not present

## 2024-01-14 DIAGNOSIS — R278 Other lack of coordination: Secondary | ICD-10-CM | POA: Diagnosis not present

## 2024-01-14 DIAGNOSIS — N200 Calculus of kidney: Secondary | ICD-10-CM | POA: Diagnosis not present

## 2024-01-14 DIAGNOSIS — R112 Nausea with vomiting, unspecified: Secondary | ICD-10-CM | POA: Diagnosis not present

## 2024-01-14 DIAGNOSIS — K219 Gastro-esophageal reflux disease without esophagitis: Secondary | ICD-10-CM | POA: Diagnosis not present

## 2024-01-14 DIAGNOSIS — R55 Syncope and collapse: Secondary | ICD-10-CM | POA: Diagnosis not present

## 2024-01-14 DIAGNOSIS — I10 Essential (primary) hypertension: Secondary | ICD-10-CM | POA: Diagnosis not present

## 2024-01-14 DIAGNOSIS — Z7401 Bed confinement status: Secondary | ICD-10-CM | POA: Diagnosis not present

## 2024-01-14 DIAGNOSIS — C9000 Multiple myeloma not having achieved remission: Secondary | ICD-10-CM | POA: Diagnosis present

## 2024-01-14 DIAGNOSIS — Z7901 Long term (current) use of anticoagulants: Secondary | ICD-10-CM | POA: Diagnosis not present

## 2024-01-14 DIAGNOSIS — N183 Chronic kidney disease, stage 3 unspecified: Secondary | ICD-10-CM | POA: Diagnosis not present

## 2024-01-14 DIAGNOSIS — Z853 Personal history of malignant neoplasm of breast: Secondary | ICD-10-CM | POA: Diagnosis not present

## 2024-01-14 DIAGNOSIS — E039 Hypothyroidism, unspecified: Secondary | ICD-10-CM | POA: Diagnosis not present

## 2024-01-14 DIAGNOSIS — I5033 Acute on chronic diastolic (congestive) heart failure: Secondary | ICD-10-CM | POA: Diagnosis not present

## 2024-01-14 DIAGNOSIS — H04123 Dry eye syndrome of bilateral lacrimal glands: Secondary | ICD-10-CM | POA: Diagnosis not present

## 2024-01-14 DIAGNOSIS — M6281 Muscle weakness (generalized): Secondary | ICD-10-CM | POA: Diagnosis not present

## 2024-01-14 DIAGNOSIS — Z743 Need for continuous supervision: Secondary | ICD-10-CM | POA: Diagnosis not present

## 2024-01-14 DIAGNOSIS — I499 Cardiac arrhythmia, unspecified: Secondary | ICD-10-CM | POA: Diagnosis not present

## 2024-01-14 DIAGNOSIS — R1013 Epigastric pain: Secondary | ICD-10-CM | POA: Diagnosis not present

## 2024-01-14 DIAGNOSIS — K5900 Constipation, unspecified: Secondary | ICD-10-CM | POA: Diagnosis not present

## 2024-01-14 DIAGNOSIS — R1312 Dysphagia, oropharyngeal phase: Secondary | ICD-10-CM | POA: Diagnosis not present

## 2024-01-14 DIAGNOSIS — R2681 Unsteadiness on feet: Secondary | ICD-10-CM | POA: Diagnosis not present

## 2024-01-14 DIAGNOSIS — Z8673 Personal history of transient ischemic attack (TIA), and cerebral infarction without residual deficits: Secondary | ICD-10-CM | POA: Diagnosis not present

## 2024-01-14 DIAGNOSIS — I442 Atrioventricular block, complete: Secondary | ICD-10-CM | POA: Diagnosis not present

## 2024-01-14 DIAGNOSIS — I480 Paroxysmal atrial fibrillation: Secondary | ICD-10-CM | POA: Diagnosis not present

## 2024-01-14 DIAGNOSIS — Z952 Presence of prosthetic heart valve: Secondary | ICD-10-CM | POA: Diagnosis not present

## 2024-01-14 MED ORDER — METOPROLOL SUCCINATE ER 50 MG PO TB24: 50.0000 mg | ORAL_TABLET | Freq: Every day | ORAL | Status: DC

## 2024-01-14 MED ORDER — LIDOCAINE 5 % EX PTCH: 1.0000 | MEDICATED_PATCH | CUTANEOUS | Status: DC

## 2024-01-14 MED ORDER — LOSARTAN POTASSIUM 50 MG PO TABS: 50.0000 mg | ORAL_TABLET | Freq: Every day | ORAL | Status: DC

## 2024-01-14 MED ORDER — HYDRALAZINE HCL 25 MG PO TABS: 25.0000 mg | ORAL_TABLET | Freq: Four times a day (QID) | ORAL | Status: DC | PRN

## 2024-01-14 MED ORDER — OXYCODONE HCL 5 MG PO TABS: 5.0000 mg | ORAL_TABLET | Freq: Four times a day (QID) | ORAL | 0 refills | Status: DC | PRN

## 2024-01-14 MED ORDER — LENALIDOMIDE 10 MG PO CAPS: 10.0000 mg | ORAL_CAPSULE | Freq: Every day | ORAL | 0 refills | Status: DC

## 2024-01-14 NOTE — Care Management Important Message (Signed)
 Important Message  Patient Details  Name: Zoe [name] MRN: [id number] Date of Birth: 08/21/1934   Important Message Given:  Yes - Medicare IM     Renie Ora 01/14/2024, 10:36 AM

## 2024-01-14 NOTE — Progress Notes (Signed)
 Report called to the facility. All questions and concerns addressed at this time.

## 2024-01-14 NOTE — Progress Notes (Signed)
 Physical Therapy Treatment Patient Details Name: Zoe [name] MRN: [id number] DOB: 09-09-1934 Today's Date: 01/14/2024   History of Present Illness 89 y.o. female admitted 3/18 with syncopal episode, described as -while changing her gown sudden loss of balance and fell onto the floor hitting her head, may have lost her consciousness.PAcemaker placed 3/19.  PMH: multiple myeloma, status post TAVR, paroxysmal atrial fibrillation, diastolic CHF, possible mild coronary angio spasm per cardiac cath in 2014, history of breast cancer status post lumpectomy in 2000, history of embolic CVA   PT Comments  Pt was able to further gait distance, ambulating ~61ft without a rest break using RW with CGA. Reviewed education on incentive spirometer use and advised pt to complete 10 reps each hour aiming for 1,091mL. She was able to demonstrate 5 reps achieving ~765mL with rest breaks in between attempts. Patient will benefit from continued inpatient follow up therapy, <3 hours/day.     If plan is discharge home, recommend the following: A little help with walking and/or transfers;A little help with bathing/dressing/bathroom;Assistance with cooking/housework;Help with stairs or ramp for entrance;Assist for transportation   Can travel by private vehicle     Yes  Equipment Recommendations  None recommended by PT    Recommendations for Other Services       Precautions / Restrictions Precautions Precautions: Fall;ICD/Pacemaker Recall of Precautions/Restrictions: Impaired     Mobility  Bed Mobility               General bed mobility comments: Not assessed. Pt greeted in recliner chair and returned there at end of session.    Transfers Overall transfer level: Needs assistance Equipment used: Rolling walker (2 wheels) Transfers: Sit to/from Stand Sit to Stand: Contact guard assist           General transfer comment: Pt stood from recliner chair by pushing from armrest with RUE and CGA to  power up into standing. She rested LUE on RW, limited WBing. Good eccentric control with sitting.    Ambulation/Gait Ambulation/Gait assistance: Contact guard assist, +2 safety/equipment (Chair Follow to increase pt's confidence, but not required or used by pt.) Gait Distance (Feet): 50 Feet Assistive device: Rolling walker (2 wheels) Gait Pattern/deviations: Step-to pattern, Decreased step length - right, Decreased step length - left, Shuffle Gait velocity: reduced Gait velocity interpretation: <1.31 ft/sec, indicative of household ambulator   General Gait Details: Pt ambulated using RW maintaining upright posture. She took slow, short, small steps, lacking floor clearence. Pt was able to manuever RW well inside room and hallway, keeping body inside the device at all times.   Stairs             Wheelchair Mobility     Tilt Bed    Modified Rankin (Stroke Patients Only)       Balance Overall balance assessment: Needs assistance Sitting-balance support: Feet supported, Single extremity supported Sitting balance-Leahy Scale: Fair Sitting balance - Comments: Pt sat on edge of recliner chair and required unilateral UE support to scoot fwd/bkwd.   Standing balance support: During functional activity, Bilateral upper extremity supported, Reliant on assistive device for balance Standing balance-Leahy Scale: Poor Standing balance comment: Pt dependent on RW.                            Communication Communication Communication: Impaired Factors Affecting Communication: Hearing impaired  Cognition Arousal: Alert Behavior During Therapy: Anxious   PT - Cognitive impairments: No apparent impairments  PT - Cognition Comments: Pt is very fearful of mobility and perseverates on her chest pain. Following commands: Intact      Cueing Cueing Techniques: Verbal cues, Tactile cues  Exercises Other Exercises Other Exercises: Incentive  Spirometer x5 reps with the goal of hitting 1,025mL. (Pt consistently reached ~796mL.)    General Comments General comments (skin integrity, edema, etc.): VSS on RA. Instructed pt to complete the incentive spirometer 10 times every hour.      Pertinent Vitals/Pain Pain Assessment Pain Assessment: Faces Faces Pain Scale: Hurts little more Pain Location: chest Pain Descriptors / Indicators: Aching, Discomfort, Tender, Pressure Pain Intervention(s): Monitored during session    Home Living                          Prior Function            PT Goals (current goals can now be found in the care plan section) Acute Rehab PT Goals Patient Stated Goal: Get better and go to rehab Progress towards PT goals: Progressing toward goals    Frequency    Min 2X/week      PT Plan      Co-evaluation              AM-PAC PT "6 Clicks" Mobility   Outcome Measure  Help needed turning from your back to your side while in a flat bed without using bedrails?: A Little Help needed moving from lying on your back to sitting on the side of a flat bed without using bedrails?: A Little Help needed moving to and from a bed to a chair (including a wheelchair)?: A Little Help needed standing up from a chair using your arms (e.g., wheelchair or bedside chair)?: A Little Help needed to walk in hospital room?: A Little Help needed climbing 3-5 steps with a railing? : A Lot 6 Click Score: 17    End of Session Equipment Utilized During Treatment: Gait belt Activity Tolerance: Patient tolerated treatment well Patient left: in chair;with call bell/phone within reach;with chair alarm set Nurse Communication: Mobility status PT Visit Diagnosis: History of falling (Z91.81);Other abnormalities of gait and mobility (R26.89)     Time: 1610-9604 PT Time Calculation (min) (ACUTE ONLY): 27 min  Charges:    $Gait Training: 23-37 mins PT General Charges $$ ACUTE PT VISIT: 1 Visit                      Zoe [name], PT, DPT Acute Rehabilitation Services Office: [phone] Secure Chat Preferred  Zoe [name] 01/14/2024, 12:09 PM

## 2024-01-14 NOTE — Consult Note (Signed)
 [facility] Liaison Consult Note   01/14/2024  JACHELLE FLUTY [birth year] [id number]  [facility] Consult:  Patient recommended for rehab SNF   Primary Care Provider:  Irena Reichmann, DO, with [facility] is listed to provide the transition of care follow up and has TOC  Insurance:  [name]  Patient was reviewed for medium high risk score 5 day length of stay for barriers to care when returning to community. 11:00 Met with patient at bedside, up in recliner, explained reason for visit for post hospital follow up needs. Patient endorses her PCP and states she has an adopted grandson [Russian, with learning needs] who is gone during the day and can only perform one task at a time.  She states she "felt he would not be much help in this recovery time"  Patient was screened for hospitalization and on behalf of [facility]  Care Coordination to assess for post hospital community care needs.  Patient is being considered for a skilled nursing facility level of care for post hospital transition, as discussed in morning progression rounds.  Plan:  If transitions to affiliated facility, the [facility] RN can be notified and follow for any known needs for transitional care needs for returning to post facility care coordination needs for returning to community.  For questions or referrals, please contact:  Charlesetta Shanks, RN, BSN, CCM [place]  [facility], [facility] Liaison Direct Dial: [phone] or secure chat Email: [email or web address]

## 2024-01-14 NOTE — Discharge Summary (Signed)
 Physician Discharge Summary  Zoe [name]:[id number] DOB: [birth year] DOA: 01/08/2024  PCP: Irena Reichmann, DO  Admit date: 01/08/2024 Discharge date: 01/14/2024 Recommendations for Outpatient Follow-up:  Follow up with PCP in 1 weeks-call for appointment Please obtain BMP/CBC in one week Eliquis on hold post PM -to resume 01/15/24 per  Discharge Dispo: SNF Discharge Condition: Stable Code Status:   Code Status: Full Code Diet recommendation:  Diet Order             Diet Heart Room service appropriate? Yes; Fluid consistency: Thin  Diet effective now                    Brief/Interim Summary: 89 y.o. female with history of multiple myeloma, status post TAVR, paroxysmal atrial fibrillation, diastolic CHF, possible mild coronary angio spasm per cardiac cath in 2014, history of breast cancer status post lumpectomy in 2000, history of embolic CVA was brought to the ER after patient had a syncopal episode, described as -while changing her gown sudden loss of balance and fell onto the floor hitting her head, may have lost her consciousness. In the ED workup unremarkable with CT head, C-spine and x-ray of the pelvis not showing anything acute.  Chest x-ray unremarkable EKG shows normal sinus rhythm.  No complaint of chest pain EKG sinus rhythm.  Troponin elevated :108>  102, was started on heparin infusion, Cardiology consulted and admitted for syncope and chest pain for evaluation. 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  Patient having ongoing chest pain but reproduced with palpation and has bruise on the sternal area suspected to be musculoskeletal pain due to her fall and also has elevated troponin postprocedure.  D-dimer were checked given patient's need for supplemental oxygen and unremarkable, suspect oxygen is due to patient's chest discomfort preventing lung expansion. Patient deconditioned  and weak PT OT recommending skilled nursing facility Cardiology reevaluated 01/13/24-no further ischemic workup needed continue pain management for musculoskeletal chest pain.  Plan for discharge to SNF.  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 Continue post pacemaker precaution-follow-up outpatient.   Atypical Chest pain Elevated troponin:? Demand ischemia VS nstemi-likely from hypertension Emeregency Hx of Severe LVH History of TAVR: Normal coronaries in 2019, troponin elevated and trended up and felt to be post pacemaker troponin bump. Has ongoing intermittent chest pain reproducible with palpation.D-dimer was negative. Cont pain control w/ tylenol, lidocaine patch and oxy for severe pain Re-evaluated by cardiology recommending no further ischemic workup and continue pain management for musculoskeletal chest pain.  Echo stable. CT chest no acute findings. Patient reassured.   Acute respiratory insufficiency SpO2 81% on room air placed on 2 L [place]. Resolved . On RA.   Chronic diastolic CHF: Euvolemic. Echo stable Monitor volume status Hypokalemia: Resolved   Hypothyroidism: Cont home Synthroid.   Multiple myeloma: on Revlimid followed by oncologist.   Paroxysmal atrial fibrillation: Continue amiodarone. Eliquis on hold post PM -to resume 01/15/24 per EP cardiology   Chronic kidney stage II: creatinine stable.  Monitor intermittently   Hypertension: Hypertensive emergency on admit: BP poorly controlled-meds adjusted 3/21 on losartan 50, Toprol 50.  Currently stable   Prior history of CVA HLD: Cont  statins and holding Eliquis post PPM-resume  on  3/25  sure Ulcer:    Subjective: Aaox3 no pain this am Eager to go to SNF On RA, getting up  Discharge Exam: Vitals:   01/14/24 0534 01/14/24 0821  BP: (!) 194/82 136/63  Pulse: 61 62  Resp: (!) 25 20  Temp: 97.6 F (36.4 C) 97.8 F (36.6 C)  SpO2: 91% 96%   General: Pt is alert, awake, not in acute distress Cardiovascular: RRR, S1/S2 +, no rubs, no gallops Respiratory: CTA bilaterally, no wheezing, no rhonchi Abdominal: Soft, NT, ND, bowel sounds + Extremities: no edema, no cyanosis  Discharge Instructions  Discharge Instructions     Discharge instructions   Complete by: As directed    Please call call MD or return to ER for similar or worsening recurring problem that brought you to hospital or if any fever,nausea/vomiting,abdominal pain, uncontrolled pain, chest pain,  shortness of breath or any other alarming symptoms.  Please follow-up your doctor as instructed in a week time and call the office for appointment.  Please avoid alcohol, smoking, or any other illicit substance and maintain healthy habits including taking your regular medications as prescribed.  You were cared for by a hospitalist during your hospital stay. If you have any questions about your discharge medications or the care you received while you were in the hospital after you are discharged, you can call the unit and ask to speak with the hospitalist on call if the hospitalist that took care of you is not available.  Once you are discharged, your primary care physician will handle any further medical issues. Please note that NO REFILLS for any discharge medications will be authorized once you are discharged, as it is imperative that you return to your primary care physician (or establish a relationship with a primary care physician if you do not have one) for your aftercare needs so that they can reassess your need for medications and monitor your lab values   Increase activity slowly   Complete by: As  directed       Allergies as of 01/14/2024       Reactions   Quinolones Other (See Comments)   Patient on Amiodarone and can prolong QT   Penicillins Other (See Comments)   UNSPECIFIED REACTION  Patient does not remember reaction.  Has patient had a PCN reaction causing immediate rash, facial/tongue/throat swelling, SOB or lightheadedness with hypotension: no Has patient had a PCN reaction causing severe rash involving mucus membranes or skin necrosis: no Has patient had a PCN reaction that required hospitalization no Has patient had a PCN reaction occurring within the last 10 years: no If all of the above answers are "NO", then may proceed with Cephalosporin use.   Sulfa Antibiotics Other (See Comments)   UNSPECIFIED REACTION  "maybe vision issues? "        Medication List     PAUSE taking these medications    apixaban 5 MG Tabs tablet Wait to take this until: January 15, 2024 Morning Commonly known as: Eliquis Take 1 tablet (5 mg total) by mouth 2 (two) times daily.       STOP taking these medications    metoprolol tartrate 50 MG tablet Commonly known as: LOPRESSOR       TAKE these medications    acetaminophen 500 MG tablet Commonly known as: TYLENOL Take 500 mg by mouth every morning.   acyclovir 400 MG tablet Commonly known as: ZOVIRAX Take 1 tablet (400 mg total) by mouth 2 (two) times  daily.   amiodarone 200 MG tablet Commonly known as: Pacerone Take 0.5 tablets (100 mg total) by mouth daily.   [employer] VITAMIN B12 1000 MCG tablet Generic drug: cyanocobalamin TAKE 1 TABLET BY MOUTH EVERY DAY   docusate sodium 100 MG capsule Commonly known as: COLACE Take 100 mg by mouth daily as needed for mild constipation.   furosemide 20 MG tablet Commonly known as: LASIX Take 1 tablet (20 mg total) by mouth as needed for edema.   lenalidomide 10 MG capsule Commonly known as: REVLIMID Take 1 capsule (10 mg total) by mouth daily. [employer] Auth # [id number]  Date  Obtained 12/17/23 Take 1 capsule daily for 21 days then none for 7 days   levothyroxine 100 MCG tablet Commonly known as: SYNTHROID Take 100 mcg by mouth daily before breakfast.   lidocaine 5 % Commonly known as: LIDODERM Place 1 patch onto the skin daily. Remove & Discard patch within 12 hours or as directed by MD   losartan 50 MG tablet Commonly known as: COZAAR Take 1 tablet (50 mg total) by mouth daily. What changed:  medication strength how much to take   metoprolol succinate 50 MG 24 hr tablet Commonly known as: TOPROL-XL Take 1 tablet (50 mg total) by mouth daily. Take with or immediately following a meal.   oxyCODONE 5 MG immediate release tablet Commonly known as: Oxy IR/ROXICODONE Take 1 tablet (5 mg total) by mouth every 6 (six) hours as needed for up to 5 doses for severe pain (pain score 7-10).   pantoprazole 40 MG tablet Commonly known as: PROTONIX Take 1 tablet (40 mg total) by mouth daily at 6 (six) AM.   polyethylene glycol 17 g packet Commonly known as: MIRALAX / GLYCOLAX Take 17 g by mouth daily as needed for mild constipation.   potassium chloride 10 MEQ tablet Commonly known as: KLOR-CON Take 1 tablet (10 mEq total) by mouth as needed (TAKE WITH LASIX FOR SWELLING).   pravastatin 40 MG tablet Commonly known as: PRAVACHOL Take 40 mg by mouth every evening.   PreserVision AREDS Caps Take 1 capsule by mouth 2 (two) times daily.   Soothe XP Soln Place 1 drop into both eyes every evening.   Vitamin D3 50 MCG (2000 UT) capsule Take 1 capsule (2,000 Units total) by mouth daily.        Contact information for follow-up providers     Marinus Maw, MD Follow up in 1 week(s).   Specialty: Cardiology Contact information: [address] [place] [address] [phone]              Contact information for after-discharge care     Destination     [facility] [place] Preferred [facility] .   Service: Skilled  Nursing Contact information: [address] [place] [address] [phone]                    Allergies  Allergen Reactions   Quinolones Other (See Comments)    Patient on Amiodarone and can prolong QT   Penicillins Other (See Comments)    UNSPECIFIED REACTION  Patient does not remember reaction.  Has patient had a PCN reaction causing immediate rash, facial/tongue/throat swelling, SOB or lightheadedness with hypotension: no Has patient had a PCN reaction causing severe rash involving mucus membranes or skin necrosis: no Has patient had a PCN reaction that required hospitalization no Has patient had a PCN reaction occurring within the last  10 years: no If all of the above answers are "NO", then may proceed with Cephalosporin use.    Sulfa Antibiotics Other (See Comments)    UNSPECIFIED REACTION  "maybe vision issues? "    The results of significant diagnostics from this hospitalization (including imaging, microbiology, ancillary and laboratory) are listed below for reference.    Microbiology: No results found for this or any previous visit (from the past 240 hours).  Procedures/Studies: CT CHEST WO CONTRAST Result Date: 01/11/2024 CLINICAL DATA:  Chest pain after fall. EXAM: CT CHEST WITHOUT CONTRAST TECHNIQUE: Multidetector CT imaging of the chest was performed following the standard protocol without IV contrast. RADIATION DOSE REDUCTION: This exam was performed according to the departmental dose-optimization program which includes automated exposure control, adjustment of the mA and/or kV according to patient size and/or use of iterative reconstruction technique. COMPARISON:  August 15, 2018. FINDINGS: Cardiovascular: Status post transcatheter aortic valve repair. Atherosclerosis of thoracic aorta is noted without aneurysm formation. Mild cardiomegaly. No pericardial effusion. Mediastinum/Nodes: Status post left thyroidectomy. No adenopathy.  Esophagus is unremarkable. Lungs/Pleura: No pneumothorax or pleural effusion is noted. Minimal bilateral posterior basilar subsegmental atelectasis is noted. Minimal subsegmental atelectasis is noted posteriorly in left upper lobe. Upper Abdomen: Stable right hepatic low density is noted consistent with hemangioma. Musculoskeletal: Status post kyphoplasty is noted at 2 levels of lower thoracic spine. No acute osseous abnormality is noted. IMPRESSION: Minimal bilateral posterior basilar subsegmental atelectasis is noted. Minimal subsegmental atelectasis is noted posteriorly in left upper lobe. Status post transcatheter aortic valve repair. Aortic Atherosclerosis (ICD10-I70.0). Electronically Signed   By: Lupita Raider M.D.   On: 01/11/2024 13:49   DG Chest 2 View Result Date: 01/10/2024 CLINICAL DATA:  Pacemaker placement. EXAM: CHEST - 2 VIEW COMPARISON:  January 09, 2024. FINDINGS: Stable cardiomediastinal silhouette. Left-sided pacemaker is unchanged. Stable left upper lobe opacity is noted concerning for pneumonia or atelectasis. Status post kyphoplasty of lower thoracic vertebral body. Right lung is clear. No pneumothorax is noted. IMPRESSION: Left upper lobe opacity is again noted concerning for pneumonia or atelectasis. Followup PA and lateral chest X-ray is recommended in 3-4 weeks following trial of antibiotic therapy to ensure resolution and exclude underlying malignancy. Electronically Signed   By: Lupita Raider M.D.   On: 01/10/2024 09:49   EP PPM/ICD IMPLANT Result Date: 01/10/2024 CONCLUSIONS:  1. Successful implantation of a Medtronic dual-chamber pacemaker for symptomatic bradycardia due to complete heart block  2. No early apparent complications.       Lewayne Bunting, MD 01/10/2024 7:29 AM  DG Chest Port 1 View Result Date: 01/09/2024 CLINICAL DATA:  Chest pain EXAM: PORTABLE CHEST 1 VIEW COMPARISON:  01/08/2024 FINDINGS: Cardiomegaly. Pacer wires in the right atrium and right ventricle.  Prior aortic valve repair. Aortic atherosclerosis. Platelike opacity in the left upper lobe, favor atelectasis. No overt edema or effusions. No acute bony abnormality. IMPRESSION: Cardiomegaly. Platelike opacity in the left upper lobe, likely atelectasis. Electronically Signed   By: Charlett Nose M.D.   On: 01/09/2024 19:25   ECHOCARDIOGRAM COMPLETE Result Date: 01/09/2024    ECHOCARDIOGRAM REPORT   Patient Name:   DANIYAH FOHL Date of Exam: 01/09/2024 Medical Rec #:  [id number]           Height:       63.0 in Accession #:    [id number]          Weight:       165.0 lb Date of Birth:  09/24/1934  BSA:          1.782 m Patient Age:    89 years            BP:           171/78 mmHg Patient Gender: F                   HR:           59 bpm. Exam Location:  Inpatient Procedure: 2D Echo, Cardiac Doppler and Color Doppler (Both Spectral and Color            Flow Doppler were utilized during procedure). Indications:    Chest Pain R07.9  History:        Patient has prior history of Echocardiogram examinations, most                 recent 04/16/2023. CHF, Previous Myocardial Infarction and                 Angina, CKD, stage 3, Arrythmias:Atrial Fibrillation and Atrial                 Flutter, Signs/Symptoms:Syncope; Risk Factors:Hypertension and                 Dyslipidemia.                 Aortic Valve: 23 mm Sapien valve is present in the aortic                 position. Procedure Date: 09/03/2018.  Sonographer:    Lucendia Herrlich RCS Referring Phys: [id number] MATTHEW A CARLISLE IMPRESSIONS  1. Left ventricular ejection fraction, by estimation, is 60 to 65%. The left ventricle has normal function. The left ventricle has no regional wall motion abnormalities. Left ventricular diastolic parameters are consistent with Grade II diastolic dysfunction (pseudonormalization). Elevated left ventricular end-diastolic pressure.  2. Right ventricular systolic function is normal. The right ventricular size is normal. There  is normal pulmonary artery systolic pressure.  3. Left atrial size was severely dilated.  4. The mitral valve is normal in structure. Mild mitral valve regurgitation. No evidence of mitral stenosis. Moderate mitral annular calcification.  5. The aortic valve has been repaired/replaced. Aortic valve regurgitation is mild. No aortic stenosis is present. There is a 23 mm Sapien valve present in the aortic position. Procedure Date: 09/03/2018.  6. The inferior vena cava is normal in size with greater than 50% respiratory variability, suggesting right atrial pressure of 3 mmHg. FINDINGS  Left Ventricle: Left ventricular ejection fraction, by estimation, is 60 to 65%. The left ventricle has normal function. The left ventricle has no regional wall motion abnormalities. The left ventricular internal cavity size was normal in size. There is  no left ventricular hypertrophy. Left ventricular diastolic parameters are consistent with Grade II diastolic dysfunction (pseudonormalization). Elevated left ventricular end-diastolic pressure. Right Ventricle: The right ventricular size is normal. No increase in right ventricular wall thickness. Right ventricular systolic function is normal. There is normal pulmonary artery systolic pressure. The tricuspid regurgitant velocity is 2.39 m/s, and  with an assumed right atrial pressure of 3 mmHg, the estimated right ventricular systolic pressure is 25.8 mmHg. Left Atrium: Left atrial size was severely dilated. Right Atrium: Right atrial size was normal in size. Pericardium: There is no evidence of pericardial effusion. Mitral Valve: The mitral valve is normal in structure. Moderate mitral annular calcification. Mild mitral valve regurgitation. No evidence of mitral valve stenosis. Tricuspid  Valve: The tricuspid valve is normal in structure. Tricuspid valve regurgitation is mild . No evidence of tricuspid stenosis. Aortic Valve: The aortic valve has been repaired/replaced. Aortic valve  regurgitation is mild. No aortic stenosis is present. Aortic valve mean gradient measures 9.0 mmHg. Aortic valve peak gradient measures 17.1 mmHg. Aortic valve area, by VTI measures 1.00 cm. There is a 23 mm Sapien valve present in the aortic position. Procedure Date: 09/03/2018. Pulmonic Valve: The pulmonic valve was normal in structure. Pulmonic valve regurgitation is trivial. No evidence of pulmonic stenosis. Aorta: The aortic root is normal in size and structure. Venous: The inferior vena cava is normal in size with greater than 50% respiratory variability, suggesting right atrial pressure of 3 mmHg. IAS/Shunts: No atrial level shunt detected by color flow Doppler.  LEFT VENTRICLE PLAX 2D LVIDd:         4.30 cm   Diastology LVIDs:         2.80 cm   LV e' medial:    0.05 cm/s LV PW:         1.30 cm   LV E/e' medial:  25.7 LV IVS:        1.10 cm   LV e' lateral:   0.06 cm/s LVOT diam:     1.70 cm   LV E/e' lateral: 23.7 LV SV:         51 LV SV Index:   29 LVOT Area:     2.27 cm  RIGHT VENTRICLE             IVC RV S prime:     12.00 cm/s  IVC diam: 1.60 cm TAPSE (M-mode): 2.3 cm LEFT ATRIUM             Index        RIGHT ATRIUM           Index LA diam:        4.40 cm 2.47 cm/m   RA Area:     16.20 cm LA Vol (A2C):   92.5 ml 51.91 ml/m  RA Volume:   36.00 ml  20.20 ml/m LA Vol (A4C):   61.0 ml 34.26 ml/m LA Biplane Vol: 75.9 ml 42.60 ml/m  AORTIC VALVE                     PULMONIC VALVE AV Area (Vmax):    1.03 cm      PR End Diast Vel: 5.66 msec AV Area (Vmean):   1.01 cm AV Area (VTI):     1.00 cm AV Vmax:           207.00 cm/s AV Vmean:          139.000 cm/s AV VTI:            0.509 m AV Peak Grad:      17.1 mmHg AV Mean Grad:      9.0 mmHg LVOT Vmax:         93.90 cm/s LVOT Vmean:        61.900 cm/s LVOT VTI:          0.225 m LVOT/AV VTI ratio: 0.44  AORTA Ao Root diam: 2.30 cm Ao Asc diam:  3.10 cm MITRAL VALVE                TRICUSPID VALVE MV Area (PHT): 3.72 cm     TR Peak grad:   22.8 mmHg MV  Decel Time: 204 msec  TR Vmax:        239.00 cm/s MR Peak grad: 87.6 mmHg MR Vmax:      468.00 cm/s   SHUNTS MV E velocity: 1.37 cm/s    Systemic VTI:  0.22 m MV A velocity: 106.00 cm/s  Systemic Diam: 1.70 cm MV E/A ratio:  0.01 Chilton Si MD Electronically signed by Chilton Si MD Signature Date/Time: 01/09/2024/4:04:13 PM    Final    CT CERVICAL SPINE WO CONTRAST Result Date: 01/08/2024 CLINICAL DATA:  Larey Seat, hit head EXAM: CT CERVICAL SPINE WITHOUT CONTRAST TECHNIQUE: Multidetector CT imaging of the cervical spine was performed without intravenous contrast. Multiplanar CT image reconstructions were also generated. RADIATION DOSE REDUCTION: This exam was performed according to the departmental dose-optimization program which includes automated exposure control, adjustment of the mA and/or kV according to patient size and/or use of iterative reconstruction technique. COMPARISON:  02/19/2021 FINDINGS: Alignment: Alignment is grossly anatomic. Skull base and vertebrae: No acute fracture. No primary bone lesion or focal pathologic process. Soft tissues and spinal canal: No prevertebral fluid or swelling. No visible canal hematoma. Disc levels: Partial bony fusion across the C3-4 and C4-5 levels. Stable hypertrophic changes at C1-C2. Multilevel facet hypertrophy most pronounced at C5-6 and C6-7. Upper chest: Airway is patent.  Lung apices are clear. Other: Reconstructed images demonstrate no additional findings. IMPRESSION: 1. No acute cervical spine fracture. 2. Stable multilevel cervical degenerative changes. Electronically Signed   By: Sharlet Salina M.D.   On: 01/08/2024 16:21   CT HEAD WO CONTRAST Result Date: 01/08/2024 CLINICAL DATA:  Larey Seat, hit head, anticoagulated EXAM: CT HEAD WITHOUT CONTRAST TECHNIQUE: Contiguous axial images were obtained from the base of the skull through the vertex without intravenous contrast. RADIATION DOSE REDUCTION: This exam was performed according to the  departmental dose-optimization program which includes automated exposure control, adjustment of the mA and/or kV according to patient size and/or use of iterative reconstruction technique. COMPARISON:  09/05/2023 FINDINGS: Brain: Hypodensities within the periventricular white matter are again noted, compatible with chronic small vessel ischemic changes. No evidence of acute infarct or hemorrhage. The lateral ventricles and midline structures are unremarkable. No acute extra-axial fluid collections. No mass effect. Vascular: No hyperdense vessel or unexpected calcification. Skull: Normal. Negative for fracture or focal lesion. Sinuses/Orbits: Postsurgical changes from prior medial wall antrectomies and bilateral ethmoidectomies. Minimal retained secretions within the maxillary and sphenoid sinuses. Other: None. IMPRESSION: 1. No acute intracranial process. 2. Stable chronic small-vessel ischemic changes within the white matter. Electronically Signed   By: Sharlet Salina M.D.   On: 01/08/2024 16:18   DG Pelvis Portable Result Date: 01/08/2024 CLINICAL DATA:  Larey Seat EXAM: PORTABLE PELVIS 1-2 VIEWS COMPARISON:  09/30/2022 FINDINGS: Supine frontal view of the pelvis includes both hips. No acute displaced fracture. Symmetrical bilateral hip osteoarthritis. The sacroiliac joints are normal. IMPRESSION: 1. No acute displaced fracture. 2. Symmetrical bilateral hip osteoarthritis. Electronically Signed   By: Sharlet Salina M.D.   On: 01/08/2024 16:16   DG Chest Port 1 View Result Date: 01/08/2024 CLINICAL DATA:  Larey Seat, atrial fibrillation EXAM: PORTABLE CHEST 1 VIEW COMPARISON:  10/01/2023 FINDINGS: Single frontal view of the chest demonstrates a stable cardiac silhouette. Stable aortic valve prosthesis. No acute airspace disease, effusion, or pneumothorax. No acute bony abnormalities. IMPRESSION: 1. No acute intrathoracic process. Electronically Signed   By: Sharlet Salina M.D.   On: 01/08/2024 16:16    Labs: BNP  (last 3 results) Recent Labs    02/17/23 0922 04/13/23 0950  BNP 454.3* 266.5*   Basic Metabolic Panel: Recent Labs  Lab 01/08/24 1354 01/08/24 1413 01/09/24 0407 01/10/24 0905  NA 138 137 139 134*  K 3.8 3.6 3.2* 3.5  CL 99 98 106 100  CO2 28  --  27 25  GLUCOSE 130* 124* 91 145*  BUN 19 20 18 16   CREATININE 1.02* 1.00 0.89 1.01*  CALCIUM 8.8*  --  8.0* 8.2*   Liver Function Tests: Recent Labs  Lab 01/08/24 1354 01/10/24 0905  AST 22 21  ALT 15 14  ALKPHOS 70 63  BILITOT 0.7 1.1  PROT 5.8* 5.3*  ALBUMIN 3.2* 2.8*   No results for input(s): "LIPASE", "AMYLASE" in the last 168 hours. No results for input(s): "AMMONIA" in the last 168 hours. CBC: Recent Labs  Lab 01/08/24 1354 01/08/24 1413 01/09/24 0407 01/10/24 0905  WBC 7.1  --  5.8 6.3  HGB 12.7 13.3 11.7* 12.0  HCT 38.8 39.0 36.4 36.9  MCV 89.4  --  90.3 89.3  PLT 102*  --  124* 104*   Urinalysis    Component Value Date/Time   COLORURINE STRAW (A) 01/08/2024 1557   APPEARANCEUR CLEAR 01/08/2024 1557   LABSPEC 1.005 01/08/2024 1557   PHURINE 7.0 01/08/2024 1557   GLUCOSEU NEGATIVE 01/08/2024 1557   HGBUR NEGATIVE 01/08/2024 1557   BILIRUBINUR NEGATIVE 01/08/2024 1557   BILIRUBINUR negative 07/21/2023 1252   KETONESUR NEGATIVE 01/08/2024 1557   PROTEINUR NEGATIVE 01/08/2024 1557   UROBILINOGEN 0.2 07/21/2023 1252   NITRITE NEGATIVE 01/08/2024 1557   LEUKOCYTESUR NEGATIVE 01/08/2024 1557   Sepsis Labs Recent Labs  Lab 01/08/24 1354 01/09/24 0407 01/10/24 0905  WBC 7.1 5.8 6.3   Microbiology No results found for this or any previous visit (from the past 240 hours).  Time coordinating discharge: 35 minutes  SIGNED: Lanae Boast, MD  [facility] 01/14/2024, 9:44 AM  If 7PM-7AM, please contact night-coverage [email or web address]

## 2024-01-14 NOTE — TOC Transition Note (Signed)
 Transition of Care [facility]) - Discharge Note   Patient Details  Name: Zoe [name] MRN: [id number] Date of Birth: Jul 31, 1934  Transition of Care [facility]) CM/SW Contact:  Michaela Corner, LCSWA Phone Number: 01/14/2024, 11:17 AM   Clinical Narrative:   Patient will DC to: [facility] on [place] Anticipated DC date: 01/14/24 Family notified: Lora (DIL) and Raynelle Fanning (dtr) Transport by: Sharin Mons   Per MD patient ready for DC to [facility]. RN to call report prior to discharge [phone]; room 107). RN, patient, patient's family, and facility notified of DC. Discharge Summary and FL2 sent to facility. DC packet on chart. Ambulance transport requested for patient at 11:15AM.  CSW will sign off for now as social work intervention is no longer needed. Please consult [place] again if new needs arise.   Final next level of care: Skilled Nursing Facility Barriers to Discharge: Barriers Resolved   Patient Goals and CMS Choice Patient states their goals for this hospitalization and ongoing recovery are:: To get better          Discharge Placement              Patient chooses bed at: [facility], [place] Patient to be transferred to facility by: [facility] Name of family member notified: Lora (DIL) and Raynelle Fanning (dtr) Patient and family notified of of transfer: 01/14/24  Discharge Plan and Services Additional resources added to the After Visit Summary for   In-house Referral: Clinical Social Work                                   Social Drivers of Health (SDOH) Interventions SDOH Screenings   Food Insecurity: No Food Insecurity (01/09/2024)  Housing: Low Risk  (01/09/2024)  Transportation Needs: No Transportation Needs (01/09/2024)  Utilities: Not At Risk (01/09/2024)  Depression (PHQ2-9): Low Risk  (11/18/2018)  Financial Resource Strain: Low Risk  (11/12/2018)  Physical Activity: Inactive (11/12/2018)  Social Connections: Moderately Isolated (01/09/2024)  Stress: Stress Concern  Present (11/12/2018)  Tobacco Use: Low Risk  (01/09/2024)     Readmission Risk Interventions     No data to display

## 2024-01-16 ENCOUNTER — Ambulatory Visit: Payer: Medicare Other

## 2024-01-16 ENCOUNTER — Telehealth: Payer: Self-pay

## 2024-01-17 NOTE — Telephone Encounter (Signed)
 I left a message on the pt voicemail to give [place] a call back. We need the serial number to her monitor or to know if she has the medtronic app.

## 2024-01-20 DIAGNOSIS — E039 Hypothyroidism, unspecified: Secondary | ICD-10-CM | POA: Diagnosis not present

## 2024-01-20 DIAGNOSIS — N200 Calculus of kidney: Secondary | ICD-10-CM | POA: Diagnosis not present

## 2024-01-20 DIAGNOSIS — C9000 Multiple myeloma not having achieved remission: Secondary | ICD-10-CM | POA: Diagnosis not present

## 2024-01-21 ENCOUNTER — Telehealth: Payer: Self-pay | Admitting: *Deleted

## 2024-01-21 NOTE — Telephone Encounter (Signed)
 TCT patient -no answer (pt is in rehab @ [facility] in [place]). Call made to her daughter, Kayona Foor. No answer but was able to leave vm message for her to return my call to [phone].  Received call back from [place]. She states her mom is still in rehab. She has been experiencing significant amount of chest pain since her hospital stay-non-cardiac, likely musculo-skelatal from CPR. This has slowed down her PT progress due to the pain and also the pain meds have made her confused and "foggy-headed" We discussed maybe having her pain meds changed from oxycodone to tramadol and alternate with Tylenol. [name] will discuss with the provider at the facility. [name] did say that she was up in a chair yesterday to eat and seemed clearer than she had been as her pain meds are scheduled farther apart.. We reviewed her upcoming appts here. [name] will let me know if Mariane Duval will be able to be here or not. No further questions or concerns. She was very happy that I Called.

## 2024-01-22 ENCOUNTER — Telehealth: Payer: Self-pay

## 2024-01-22 DIAGNOSIS — R112 Nausea with vomiting, unspecified: Secondary | ICD-10-CM | POA: Diagnosis not present

## 2024-01-22 NOTE — Telephone Encounter (Signed)
 Lmovm for pt to give the device clinic a call. We need the serial number for her home remote monitor.

## 2024-01-22 NOTE — Telephone Encounter (Signed)
 See 01/22/2024 phone note.

## 2024-01-23 ENCOUNTER — Ambulatory Visit: Attending: Cardiovascular Disease

## 2024-01-23 DIAGNOSIS — I442 Atrioventricular block, complete: Secondary | ICD-10-CM | POA: Diagnosis not present

## 2024-01-23 DIAGNOSIS — I5033 Acute on chronic diastolic (congestive) heart failure: Secondary | ICD-10-CM

## 2024-01-23 DIAGNOSIS — I480 Paroxysmal atrial fibrillation: Secondary | ICD-10-CM | POA: Diagnosis not present

## 2024-01-23 LAB — CUP PACEART INCLINIC DEVICE CHECK
Date Time Interrogation Session: 20250402132547
Implantable Lead Connection Status: 753985
Implantable Lead Connection Status: 753985
Implantable Lead Implant Date: 20250319
Implantable Lead Implant Date: 20250319
Implantable Lead Location: 753859
Implantable Lead Location: 753860
Implantable Lead Model: 3830
Implantable Lead Model: 5076
Implantable Pulse Generator Implant Date: 20250319

## 2024-01-23 NOTE — Patient Instructions (Signed)
   After Your Pacemaker   Monitor your pacemaker site for redness, swelling, and drainage. Call the device clinic at [phone] if you experience these symptoms or fever/chills.  Your incision was closed with Steri-strips or staples:  You may shower 7 days after your procedure and wash your incision with soap and water. Avoid lotions, ointments, or perfumes over your incision until it is well-healed.  You may use a hot tub or a pool after your wound check appointment if the incision is completely closed.  Do not lift, push or pull greater than 10 pounds with the affected arm until 6 weeks after your procedure.  UNTIL AFTER APRIL 30TH.  There are no other restrictions in arm movement after your wound check appointment.  You may drive, unless driving has been restricted by your healthcare providers.  Remote monitoring is used to monitor your pacemaker from home. This monitoring is scheduled every 91 days by our office. It allows [place] to keep an eye on the functioning of your device to ensure it is working properly. You will routinely see your Electrophysiologist annually (more often if necessary).

## 2024-01-23 NOTE — Progress Notes (Signed)
 Normal dual chamber pacemaker wound check. Presenting rhythm: AP/VS. Wound well healed. Routine testing performed. Thresholds, sensing, and impedances consistent with implant measurements and at 3.5V safety margin/auto capture until 3 month visit. No episodes. Reviewed arm restrictions to continue for 6 weeks total post op.  Pt enrolled in remote follow-up.

## 2024-01-23 NOTE — Telephone Encounter (Signed)
 Spoke with [facility] and I have put pt serial number in carelink

## 2024-01-25 ENCOUNTER — Telehealth: Payer: Self-pay

## 2024-01-25 ENCOUNTER — Encounter: Payer: Self-pay | Admitting: Cardiology

## 2024-01-25 NOTE — Telephone Encounter (Signed)
 You pain is not related to cardiac issues or your aortic valve. Sometimes, manipulation of the chest wall may give you chest pain, Try Tylenol and see if it helps. I sent this message on mychart as she is active

## 2024-01-25 NOTE — Telephone Encounter (Signed)
 While in device clinic for 14 day post new PPM insertion, patient reports ongoing pain/discomfort to lower/mid sternal and under Bilateral breast/chest area.  This pain radiates through to back and is pretty constant.  Patient denies any SOB.    She wanted to know if it was coming from the device and if there was anything we could do for it.  (Wound site has healed well, no signs of infection or inflammation at site).   In reading ER/hospital notes, they document her having chest pain upon arrival following a syncopal event and subsequent fall.  Cardiac labs/screening were performed and determination made that this was likely due to musculoskeletal pain from the fall.  Patient is currently wearing a lidocaine patch to mid sternal chest area.   I informed her that while the procedure may have exacerbated the soreness already present, that it was not related to the device as pain started before implant.  She likely may be suffering from some costochondritis and strained muscles as daughter confirms xrays were performed that ruled out rib fractures.   Patient confirms timing of pain and that it has not gotten worse since discharge from hospital but doesn't seem to be improving.    I let patient know that soreness like this will likely continue for several weeks and be slow to heal.  She has a primary care doctor who follows her at the facility.  I recommended that she should follow up with primary care doctor for this discomfort and pain management plan.   Patient and daughter would like for Dr. Ladona Ridgel and Dr. Jacinto Halim to be made aware.

## 2024-01-28 ENCOUNTER — Encounter (INDEPENDENT_AMBULATORY_CARE_PROVIDER_SITE_OTHER): Payer: Medicare Other | Admitting: Ophthalmology

## 2024-01-30 ENCOUNTER — Inpatient Hospital Stay: Attending: Physician Assistant

## 2024-01-30 ENCOUNTER — Telehealth: Payer: Self-pay | Admitting: Hematology and Oncology

## 2024-01-30 ENCOUNTER — Encounter: Payer: Self-pay | Admitting: Hematology and Oncology

## 2024-01-30 DIAGNOSIS — C9000 Multiple myeloma not having achieved remission: Secondary | ICD-10-CM | POA: Diagnosis not present

## 2024-01-30 LAB — CBC WITH DIFFERENTIAL (CANCER CENTER ONLY)
Abs Immature Granulocytes: 0.02 10*3/uL (ref 0.00–0.07)
Basophils Absolute: 0.1 10*3/uL (ref 0.0–0.1)
Basophils Relative: 1 %
Eosinophils Absolute: 0.2 10*3/uL (ref 0.0–0.5)
Eosinophils Relative: 2 %
HCT: 38.5 % (ref 36.0–46.0)
Hemoglobin: 12.9 g/dL (ref 12.0–15.0)
Immature Granulocytes: 0 %
Lymphocytes Relative: 13 %
Lymphs Abs: 0.9 10*3/uL (ref 0.7–4.0)
MCH: 28.9 pg (ref 26.0–34.0)
MCHC: 33.5 g/dL (ref 30.0–36.0)
MCV: 86.1 fL (ref 80.0–100.0)
Monocytes Absolute: 1.4 10*3/uL — ABNORMAL HIGH (ref 0.1–1.0)
Monocytes Relative: 21 %
Neutro Abs: 4 10*3/uL (ref 1.7–7.7)
Neutrophils Relative %: 63 %
Platelet Count: 135 10*3/uL — ABNORMAL LOW (ref 150–400)
RBC: 4.47 MIL/uL (ref 3.87–5.11)
RDW: 16.8 % — ABNORMAL HIGH (ref 11.5–15.5)
WBC Count: 6.6 10*3/uL (ref 4.0–10.5)
nRBC: 0 % (ref 0.0–0.2)

## 2024-01-30 LAB — CMP (CANCER CENTER ONLY)
ALT: 21 U/L (ref 0–44)
AST: 18 U/L (ref 15–41)
Albumin: 3.7 g/dL (ref 3.5–5.0)
Alkaline Phosphatase: 168 U/L — ABNORMAL HIGH (ref 38–126)
Anion gap: 8 (ref 5–15)
BUN: 22 mg/dL (ref 8–23)
CO2: 28 mmol/L (ref 22–32)
Calcium: 9.1 mg/dL (ref 8.9–10.3)
Chloride: 100 mmol/L (ref 98–111)
Creatinine: 1.18 mg/dL — ABNORMAL HIGH (ref 0.44–1.00)
GFR, Estimated: 44 mL/min — ABNORMAL LOW (ref >60)
Glucose, Bld: 97 mg/dL (ref 70–99)
Potassium: 4.1 mmol/L (ref 3.5–5.1)
Sodium: 136 mmol/L (ref 135–145)
Total Bilirubin: 0.4 mg/dL (ref 0.0–1.2)
Total Protein: 6.4 g/dL — ABNORMAL LOW (ref 6.5–8.1)

## 2024-01-30 LAB — LACTATE DEHYDROGENASE: LDH: 251 U/L — ABNORMAL HIGH (ref 98–192)

## 2024-01-30 NOTE — Telephone Encounter (Signed)
 Zoe [name] stated that her driver is picking her up and she is on her way to her appointment. Zoe [name] does not want to cancel her appointment.

## 2024-01-30 NOTE — Telephone Encounter (Signed)
 Called Ginny due to a staff message I received. [place], Jessica  P Onc Scheduler Cc "Pt left a message asking about her appt info for this morning. Please call." Mariane Duval was not available.

## 2024-01-30 NOTE — Telephone Encounter (Signed)
 Zoe [name]

## 2024-01-31 LAB — KAPPA/LAMBDA LIGHT CHAINS
Kappa free light chain: 33.2 mg/L — ABNORMAL HIGH (ref 3.3–19.4)
Kappa, lambda light chain ratio: 1.81 — ABNORMAL HIGH (ref 0.26–1.65)
Lambda free light chains: 18.3 mg/L (ref 5.7–26.3)

## 2024-02-01 ENCOUNTER — Other Ambulatory Visit: Payer: Self-pay | Admitting: *Deleted

## 2024-02-01 NOTE — Progress Notes (Signed)
 Mrs. Lesnick resides in [facility] [place] [facility].  Screening for potential complex care management services as benefit of health plan and primary care provider.  Collaboration with [facility] Child psychotherapist. Unsure of transition date/plans at this current time. Mrs. Belasco was from home with grandson prior.   Will continue to follow for potential complex care management needs.   Raiford Noble, MSN, RN, BSN [place]  [facility] RN Post- Acute Care Manager Direct Dial: [phone]

## 2024-02-03 LAB — MULTIPLE MYELOMA PANEL, SERUM
Albumin SerPl Elph-Mcnc: 3.1 g/dL (ref 2.9–4.4)
Albumin/Glob SerPl: 1.2 (ref 0.7–1.7)
Alpha 1: 0.3 g/dL (ref 0.0–0.4)
Alpha2 Glob SerPl Elph-Mcnc: 0.8 g/dL (ref 0.4–1.0)
B-Globulin SerPl Elph-Mcnc: 0.8 g/dL (ref 0.7–1.3)
Gamma Glob SerPl Elph-Mcnc: 0.7 g/dL (ref 0.4–1.8)
Globulin, Total: 2.7 g/dL (ref 2.2–3.9)
IgA: 170 mg/dL (ref 64–422)
IgG (Immunoglobin G), Serum: 796 mg/dL (ref 586–1602)
IgM (Immunoglobulin M), Srm: 37 mg/dL (ref 26–217)
M Protein SerPl Elph-Mcnc: 0.1 g/dL — ABNORMAL HIGH
Total Protein ELP: 5.8 g/dL — ABNORMAL LOW (ref 6.0–8.5)

## 2024-02-04 ENCOUNTER — Telehealth: Payer: Self-pay | Admitting: *Deleted

## 2024-02-04 NOTE — Telephone Encounter (Signed)
 Received vm message from pt requesting a call back. Attempted call to pt (she is in rehab currently)  No answer but was able to leave vm message for her to call back @ [phone]

## 2024-02-13 ENCOUNTER — Other Ambulatory Visit: Payer: Self-pay | Admitting: *Deleted

## 2024-02-13 ENCOUNTER — Telehealth: Payer: Self-pay | Admitting: *Deleted

## 2024-02-13 MED ORDER — LENALIDOMIDE 10 MG PO CAPS: 10.0000 mg | ORAL_CAPSULE | Freq: Every day | ORAL | 0 refills | Status: DC

## 2024-02-13 NOTE — Telephone Encounter (Signed)
 Received call from pt's daughter-in-law, Zoe [name]. She states that Zoe [name] will be moving from [facility] to [facility] on Monday, April 28th. Zoe [name] is asking about Zoe [name]'s Revlimid  and where the next delivery of it should go.  Advised that she should call [facility] and see how they handle pharmacy deliveries that need to be be signed for.. Once she has that answer she can call [employer] to give them specific delivery instructions. She is aware of Zoe [name]'s f/u appts for May and June.

## 2024-02-17 DIAGNOSIS — I4891 Unspecified atrial fibrillation: Secondary | ICD-10-CM | POA: Diagnosis not present

## 2024-02-20 ENCOUNTER — Telehealth: Payer: Self-pay | Admitting: *Deleted

## 2024-02-20 NOTE — Telephone Encounter (Signed)
 TCXT patient in response to her call here yesterday.  No answer but was able to leave vm message for her to return this call to [phone]. TCT her daughter-in-law, Anacani Wess. No answer

## 2024-02-21 ENCOUNTER — Ambulatory Visit: Payer: Medicare Other | Attending: Cardiology | Admitting: Cardiology

## 2024-02-21 ENCOUNTER — Encounter: Payer: Self-pay | Admitting: Cardiology

## 2024-02-21 VITALS — BP 126/68 | HR 68 | Ht 63.0 in | Wt 165.0 lb

## 2024-02-21 DIAGNOSIS — Z952 Presence of prosthetic heart valve: Secondary | ICD-10-CM | POA: Diagnosis not present

## 2024-02-21 DIAGNOSIS — I5032 Chronic diastolic (congestive) heart failure: Secondary | ICD-10-CM | POA: Diagnosis not present

## 2024-02-21 DIAGNOSIS — I10 Essential (primary) hypertension: Secondary | ICD-10-CM | POA: Diagnosis not present

## 2024-02-21 DIAGNOSIS — I6522 Occlusion and stenosis of left carotid artery: Secondary | ICD-10-CM | POA: Diagnosis not present

## 2024-02-21 DIAGNOSIS — I480 Paroxysmal atrial fibrillation: Secondary | ICD-10-CM

## 2024-02-21 NOTE — Progress Notes (Signed)
 Cardiology Office Note:  .   Date:  02/21/2024  ID:  Zoe [name], DOB 05-08-1934, MRN [id number] PCP: Zoe Brand, DO  [facility] Providers Cardiologist:  Zoe Perl, MD Electrophysiologist:  Zoe Sells, MD   History of Present Illness: .   Zoe [name] is a 89 y.o. [place] female with history of possibly mild coronary spasm by coronary angiogram in 2014,  severe AS s/p TAVR by Dr. Alva [name] on 09/03/2018, asymptomatic bilateral carotid artery stenosis,  essential hypertension, chronic stage 3a CKD, mild hyponatremia probably a combination of chronic diastolic heart failure and medications, hyperlipidemia, hyperglycemia and GERD. Her last admission to the hospital on 10/01/2023 was with dizziness and A-fib with RVR and she spontaneously converted to sinus rhythm after Valsalva maneuver.  She is presently on amiodarone  100 mg daily and Eliquis  5 mg p.o. twice daily..  Discussed the use of AI scribe software for clinical note transcription with the patient, who gave verbal consent to proceed.  History of Present Illness Zoe  Zoe [name] "Zoe [name]" is an 89 year old female with chronic diastolic heart failure and atrial fibrillation who presents with balance issues and swelling. She experiences balance issues and swelling, which are her primary concerns. She is eager to start physical therapy to improve her muscle strength. She takes furosemide  20 mg three times a week for swelling, as previously recommended. Her breathing is generally stable, with occasional shortness of breath.  She has atrial fibrillation and takes amiodarone  100 mg daily. She has not had recent episodes of atrial fibrillation. She recalls a past hospitalization for a heart attack with mildly elevated serum troponin, attributed to atrial fibrillation and demand ischemia.    She experiences severe localized chest pain that is intermittent and has been evaluated as structural rather than cardiac in  origin.  Labs   Lab Results  Component Value Date   CHOL 174 03/30/2023   HDL 59 03/30/2023   LDLCALC 100 (H) 03/30/2023   LDLDIRECT 48 07/16/2019   TRIG 76 03/30/2023   CHOLHDL 2.9 03/30/2023   Lab Results  Component Value Date   NA 136 01/30/2024   K 4.1 01/30/2024   CO2 28 01/30/2024   GLUCOSE 97 01/30/2024   BUN 22 01/30/2024   CREATININE 1.18 (H) 01/30/2024   CALCIUM  9.1 01/30/2024   EGFR 59 (L) 06/19/2022   GFRNONAA 44 (L) 01/30/2024      Latest Ref Rng & Units 01/30/2024   10:00 AM 01/10/2024    9:05 AM 01/09/2024    4:07 AM  BMP  Glucose 70 - 99 mg/dL 97  725  91   BUN 8 - 23 mg/dL 22  16  18    Creatinine 0.44 - 1.00 mg/dL 3.66  4.40  3.47   Sodium 135 - 145 mmol/L 136  134  139   Potassium 3.5 - 5.1 mmol/L 4.1  3.5  3.2   Chloride 98 - 111 mmol/L 100  100  106   CO2 22 - 32 mmol/L 28  25  27    Calcium  8.9 - 10.3 mg/dL 9.1  8.2  8.0       Latest Ref Rng & Units 01/30/2024   10:00 AM 01/10/2024    9:05 AM 01/09/2024    4:07 AM  CBC  WBC 4.0 - 10.5 K/uL 6.6  6.3  5.8   Hemoglobin 12.0 - 15.0 g/dL 42.5  95.6  38.7   Hematocrit 36.0 - 46.0 % 38.5  36.9  36.4   Platelets  150 - 400 K/uL 135  104  124    Lab Results  Component Value Date   HGBA1C 5.2 03/30/2023    Lab Results  Component Value Date   TSH 0.891 01/09/2024    ROS  Review of Systems  Cardiovascular:  Positive for leg swelling. Negative for chest pain and dyspnea on exertion.    Physical Exam:   VS:  BP 126/68 (BP Location: Right Arm, Patient Position: Sitting)   Pulse 68   Ht 5\' 3"  (1.6 m)   Wt 165 lb (74.8 kg)   SpO2 97%   BMI 29.23 kg/m    Wt Readings from Last 3 Encounters:  02/21/24 165 lb (74.8 kg)  01/13/24 164 lb 3.9 oz (74.5 kg)  01/01/24 164 lb 6.4 oz (74.6 kg)    Physical Exam Neck:     Vascular: No JVD.  Cardiovascular:     Rate and Rhythm: Normal rate and regular rhythm.     Pulses: Intact distal pulses.     Heart sounds: S1 normal and S2 normal. Murmur heard.      Early systolic murmur is present with a grade of 2/6 at the upper right sternal border.     No gallop.  Pulmonary:     Effort: Pulmonary effort is normal.     Breath sounds: Normal breath sounds.  Abdominal:     General: Bowel sounds are normal.     Palpations: Abdomen is soft.  Musculoskeletal:     Right lower leg: No edema.     Left lower leg: No edema.    Studies Reviewed: Zoe [name]    ECHOCARDIOGRAM COMPLETE 01/09/2024  1. Left ventricular ejection fraction, by estimation, is 60 to 65%. The left ventricle has normal function. The left ventricle has no regional wall motion abnormalities. Left ventricular diastolic parameters are consistent with Grade II diastolic dysfunction (pseudonormalization). Elevated left ventricular end-diastolic pressure. 2. Right ventricular systolic function is normal. The right ventricular size is normal. There is normal pulmonary artery systolic pressure. 3. Left atrial size was severely dilated. 4. The mitral valve is normal in structure. Mild mitral valve regurgitation. No evidence of mitral stenosis. Moderate mitral annular calcification. 5. The aortic valve has been repaired/replaced. Aortic valve regurgitation is mild. No aortic stenosis is present. There is a 23 mm Sapien valve present in the aortic position. Procedure Date: 09/03/2018.  EKG:         Medications and allergies    Allergies  Allergen Reactions   Quinolones Other (See Comments)    Patient on Amiodarone  and can prolong QT   Penicillins Other (See Comments)    UNSPECIFIED REACTION  Patient does not remember reaction.  Has patient had a PCN reaction causing immediate rash, facial/tongue/throat swelling, SOB or lightheadedness with hypotension: no Has patient had a PCN reaction causing severe rash involving mucus membranes or skin necrosis: no Has patient had a PCN reaction that required hospitalization no Has patient had a PCN reaction occurring within the last 10 years: no If all of the  above answers are "NO", then may proceed with Cephalosporin use.    Sulfa Antibiotics Other (See Comments)    UNSPECIFIED REACTION  "maybe vision issues? "     Current Outpatient Medications:    acetaminophen  (TYLENOL ) 500 MG tablet, Take 500 mg by mouth every morning., Disp: , Rfl:    acyclovir  (ZOVIRAX ) 400 MG tablet, Take 1 tablet (400 mg total) by mouth 2 (two) times daily., Disp: 180 tablet, Rfl: 1  ALPRAZOLAM PO, Take 0.25 mg by mouth daily as needed., Disp: , Rfl:    amiodarone  (PACERONE ) 200 MG tablet, Take 0.5 tablets (100 mg total) by mouth daily., Disp: 45 tablet, Rfl: 3   amLODipine  (NORVASC ) 5 MG tablet, Take 5 mg by mouth daily., Disp: , Rfl:    apixaban  (ELIQUIS ) 5 MG TABS tablet, Take 1 tablet (5 mg total) by mouth 2 (two) times daily., Disp: 180 tablet, Rfl: 1   Artificial Tear Solution (SOOTHE XP) SOLN, Place 1 drop into both eyes every evening., Disp: , Rfl:    Cholecalciferol  (VITAMIN D3) 50 MCG (2000 UT) capsule, Take 1 capsule (2,000 Units total) by mouth daily. (Patient taking differently: Take 1,000 Units by mouth daily.), Disp: , Rfl:    [employer] VITAMIN B12 1000 MCG tablet, TAKE 1 TABLET BY MOUTH EVERY DAY, Disp: 90 tablet, Rfl: 1   docusate sodium  (COLACE) 100 MG capsule, Take 100 mg by mouth daily as needed for mild constipation., Disp: , Rfl:    levothyroxine  (SYNTHROID , LEVOTHROID) 100 MCG tablet, Take 100 mcg by mouth daily before breakfast., Disp: , Rfl: 2   lidocaine  (LIDODERM ) 5 %, Place 1 patch onto the skin daily. Remove & Discard patch within 12 hours or as directed by MD, Disp: , Rfl:    losartan  (COZAAR ) 50 MG tablet, Take 1 tablet (50 mg total) by mouth daily., Disp: , Rfl:    metoprolol  succinate (TOPROL -XL) 50 MG 24 hr tablet, Take 1 tablet (50 mg total) by mouth daily. Take with or immediately following a meal., Disp: , Rfl:    Multiple Vitamins-Minerals (PRESERVISION AREDS) CAPS, Take 1 capsule by mouth 2 (two) times daily., Disp: , Rfl:    oxyCODONE   (OXY IR/ROXICODONE ) 5 MG immediate release tablet, Take 1 tablet (5 mg total) by mouth every 6 (six) hours as needed for up to 5 doses for severe pain (pain score 7-10)., Disp: 5 tablet, Rfl: 0   pantoprazole  (PROTONIX ) 40 MG tablet, Take 1 tablet (40 mg total) by mouth daily at 6 (six) AM., Disp: 30 tablet, Rfl: 0   polyethylene glycol (MIRALAX  / GLYCOLAX ) 17 g packet, Take 17 g by mouth daily as needed for mild constipation., Disp: , Rfl:    pravastatin  (PRAVACHOL ) 40 MG tablet, Take 40 mg by mouth every evening., Disp: , Rfl:    traMADol  (ULTRAM ) 50 MG tablet, Take 50 mg by mouth daily., Disp: , Rfl:    furosemide  (LASIX ) 20 MG tablet, Take 1 tablet (20 mg total) by mouth as needed for edema., Disp: , Rfl:    lenalidomide  (REVLIMID ) 10 MG capsule, Take 1 capsule (10 mg total) by mouth daily. [employer] Auth # [id number]  Date Obtained 02/13/24 Take 1 capsule daily for 21 days then none for 7 days (Patient not taking: Reported on 02/21/2024), Disp: 21 capsule, Rfl: 0   potassium chloride  (KLOR-CON ) 10 MEQ tablet, Take 1 tablet (10 mEq total) by mouth as needed (TAKE WITH LASIX  FOR SWELLING)., Disp: , Rfl:    No orders of the defined types were placed in this encounter.    There are no discontinued medications.   ASSESSMENT AND PLAN: .      ICD-10-CM   1. Chronic diastolic (congestive) heart failure (HCC)  I50.32     2. Paroxysmal atrial fibrillation (HCC)  I48.0     3. TAVR 09/03/2018: Edwards Sapien 3 THV (size 23 mm, model # 9600TFX, serial # [id number])  Z95.2     4. Primary hypertension  I10  5. Asymptomatic stenosis of left carotid artery  I65.22 VAS US  CAROTID     Assessment & Plan Chronic diastolic heart failure   Chronic diastolic heart failure is well-controlled with losartan , metoprolol , amlodipine , and furosemide . She experiences occasional dyspnea and peripheral edema, likely due to dietary sodium intake. Continue current medications: losartan  50 mg, metoprolol  50 mg, and  amlodipine  5 mg once daily, and furosemide  20 mg three times a week. Adjust furosemide  to daily if weight increases by 2-3 pounds or if dyspnea worsens. Take one potassium supplement with each furosemide  dose. Monitor weight and symptoms to adjust furosemide  as needed.  Heart failure symptoms and diet discussed extensively.  Paroxysmal atrial fibrillation   Paroxysmal atrial fibrillation is well-controlled with amiodarone  100 mg daily. No recent episodes reported.  Presently on Eliquis  5 mg twice daily for stroke prevention.  Mild carotid artery disease   Mild carotid artery disease presents a low risk of stroke. Previous imaging showed stability. Continue management with pravastatin . Order a carotid duplex ultrasound to assess stability and if stable, no further evaluation is indicated.  Musculoskeletal chest pain   Intermittent severe localized chest pain is likely musculoskeletal, with no cardiac involvement as confirmed by EKG, x-ray, and CT scan. Recommend acetaminophen  for pain management as needed.  Incontinence   Nocturnal incontinence causes frequent awakenings. Discussed potential use of the PurWiK device for continuous drainage to improve sleep quality.   Otherwise she remains stable from cardiac standpoint, I will see her back in 6 months.   Signed,  Zoe Perl, MD, [facility] 02/21/2024, 10:41 PM [facility] [address] [place], [place] [address] Phone: [phone]. Fax:  [phone]

## 2024-02-21 NOTE — Patient Instructions (Addendum)
 Medication Instructions:  Your physician recommends that you continue on your current medications as directed. Please refer to the Current Medication list given to you today.  *If you need a refill on your cardiac medications before your next appointment, please call your pharmacy*  Lab Work: none If you have labs (blood work) drawn today and your tests are completely normal, you will receive your results only by: MyChart Message (if you have MyChart) OR A paper copy in the mail If you have any lab test that is abnormal or we need to change your treatment, we will call you to review the results.  Testing/Procedures: Your physician has requested that you have a carotid duplex. This test is an ultrasound of the carotid arteries in your neck. It looks at blood flow through these arteries that supply the brain with blood. Allow one hour for this exam. There are no restrictions or special instructions.   Follow-Up: At [facility], you and your health needs are our priority.  As part of our continuing mission to provide you with exceptional heart care, our providers are all part of one team.  This team includes your primary Cardiologist (physician) and Advanced Practice Providers or APPs (Physician Assistants and Nurse Practitioners) who all work together to provide you with the care you need, when you need it.  Your next appointment:   6 months  Provider:   Knox Perl, MD    We recommend signing up for the patient portal called "MyChart".  Sign up information is provided on this After Visit Summary.  MyChart is used to connect with patients for Virtual Visits (Telemedicine).  Patients are able to view lab/test results, encounter notes, upcoming appointments, etc.  Non-urgent messages can be sent to your provider as well.   To learn more about what you can do with MyChart, go to [email or web address].   Other Instructions

## 2024-02-22 ENCOUNTER — Emergency Department (HOSPITAL_COMMUNITY)

## 2024-02-22 ENCOUNTER — Telehealth: Payer: Self-pay | Admitting: Hematology and Oncology

## 2024-02-22 ENCOUNTER — Other Ambulatory Visit: Payer: Self-pay

## 2024-02-22 ENCOUNTER — Telehealth: Payer: Self-pay | Admitting: *Deleted

## 2024-02-22 ENCOUNTER — Encounter (HOSPITAL_COMMUNITY): Payer: Self-pay | Admitting: *Deleted

## 2024-02-22 ENCOUNTER — Emergency Department (HOSPITAL_COMMUNITY)
Admission: EM | Admit: 2024-02-22 | Discharge: 2024-02-22 | Disposition: A | Discharge status: Home / Self Care | Attending: Student | Admitting: Student

## 2024-02-22 DIAGNOSIS — Z79899 Other long term (current) drug therapy: Secondary | ICD-10-CM | POA: Insufficient documentation

## 2024-02-22 DIAGNOSIS — E039 Hypothyroidism, unspecified: Secondary | ICD-10-CM | POA: Diagnosis not present

## 2024-02-22 DIAGNOSIS — M5080 Other cervical disc disorders, unspecified cervical region: Secondary | ICD-10-CM | POA: Diagnosis not present

## 2024-02-22 DIAGNOSIS — Z7901 Long term (current) use of anticoagulants: Secondary | ICD-10-CM | POA: Insufficient documentation

## 2024-02-22 DIAGNOSIS — M50222 Other cervical disc displacement at C5-C6 level: Secondary | ICD-10-CM | POA: Diagnosis not present

## 2024-02-22 DIAGNOSIS — M4310 Spondylolisthesis, site unspecified: Secondary | ICD-10-CM | POA: Diagnosis not present

## 2024-02-22 DIAGNOSIS — Z853 Personal history of malignant neoplasm of breast: Secondary | ICD-10-CM | POA: Diagnosis not present

## 2024-02-22 DIAGNOSIS — R6889 Other general symptoms and signs: Secondary | ICD-10-CM | POA: Diagnosis not present

## 2024-02-22 DIAGNOSIS — M9931 Osseous stenosis of neural canal of cervical region: Secondary | ICD-10-CM | POA: Diagnosis not present

## 2024-02-22 DIAGNOSIS — I509 Heart failure, unspecified: Secondary | ICD-10-CM | POA: Diagnosis not present

## 2024-02-22 DIAGNOSIS — M5030 Other cervical disc degeneration, unspecified cervical region: Secondary | ICD-10-CM

## 2024-02-22 DIAGNOSIS — M5020 Other cervical disc displacement, unspecified cervical region: Secondary | ICD-10-CM | POA: Diagnosis not present

## 2024-02-22 DIAGNOSIS — M47812 Spondylosis without myelopathy or radiculopathy, cervical region: Secondary | ICD-10-CM | POA: Diagnosis not present

## 2024-02-22 DIAGNOSIS — M542 Cervicalgia: Secondary | ICD-10-CM | POA: Diagnosis not present

## 2024-02-22 MED ORDER — ACETAMINOPHEN 325 MG PO TABS
650.0000 mg | ORAL_TABLET | Freq: Once | ORAL | Status: AC
  Administered 2024-02-22: 650 mg via ORAL
  Filled 2024-02-22: qty 2

## 2024-02-22 MED ORDER — LIDOCAINE 5 % EX PTCH: 1.0000 | MEDICATED_PATCH | CUTANEOUS | 0 refills | Status: DC

## 2024-02-22 MED ORDER — LIDOCAINE 5 % EX PTCH
1.0000 | MEDICATED_PATCH | CUTANEOUS | Status: DC
  Administered 2024-02-22: 1 via TRANSDERMAL
  Filled 2024-02-22: qty 1

## 2024-02-22 NOTE — ED Provider Triage Note (Signed)
 Emergency Medicine Provider Triage Evaluation Note  Zoe [name] , a 89 y.o. female  was evaluated in triage.  Pt complains of neck pain.  Review of Systems  Positive: Neck pain Negative: No weakness, numbness  Physical Exam  BP (!) 173/71   Pulse 68   Temp 97.9 F (36.6 C)   Resp 16   Ht 5\' 3"  (1.6 m)   Wt 74.8 kg   SpO2 98%   BMI 29.23 kg/m  Gen:   Awake, no distress   Resp:  Normal effort  MSK:   Moves extremities without difficulty  Other:  TTP to b/l paraspinal cervical muscles, no midle spinal pain, 5/5 strength in UE, normal sensaton  Medical Decision Making  Medically screening exam initiated at 9:23 AM.  Appropriate orders placed.  [place]  B Mortell was informed that the remainder of the evaluation will be completed by another provider, this initial triage assessment does not replace that evaluation, and the importance of remaining in the ED until their evaluation is complete.     Lowery Rue, DO 02/22/24 [phone]

## 2024-02-22 NOTE — Telephone Encounter (Signed)
 Received vm message from pt's daughter-in-law, Idman Vittitoe, requesting call back. TCT her and spoke with her. 1st question is related to her Revlimid -wondering when Ginny needs to re-start this. It has been delivered to her @ [facility]. Bishop Bullock also states that [name] is in the ED @ [facility] now due to pain in her neck. Ginny did see cardiologist yesterday and received a good bill of health from him. Bishop Bullock is just waiting to find out about the neck pain. Advised that it appears that she is scheduled for a CT scan of her cervial spine this am. Advised that Dr. Rosaline Coma prefers to wait on restarting the Revlimid  until she is seen here in the clinic. Advised that I will get her scheduled with Wyline Hearing, PA-C next week with labs as Dr. Rosaline Coma will be out of the office for the next 2 weeks.  Bishop Bullock is good with that.

## 2024-02-22 NOTE — ED Provider Notes (Signed)
 [place] EMERGENCY DEPARTMENT AT [facility] Provider Note   CSN: [id number] Arrival date & time: 02/22/24  0865     History  Chief Complaint  Patient presents with   Neck Pain    Zoe [name] is a 89 y.o. female status post TAVR, history of breast cancer, GERD, hypothyroidism, CHF, chronic balance issues, NSTEMI, chronic Eliquis  use presented for neck pain that she woke up with this morning.  Patient states she normally sleeps on her left side and now she has left-sided neck pain.  Patient denies any paresthesia weakness with me.  Patient denies any vision changes.  Patient hurts when she moves her neck.  Patient was given Tylenol  and triage but unsure if this helps.  Home Medications Prior to Admission medications   Medication Sig Start Date End Date Taking? Authorizing Provider  lidocaine  (LIDODERM ) 5 % Place 1 patch onto the skin daily. Remove & Discard patch within 12 hours or as directed by MD 02/22/24  Yes Makena Mcgrady, Arlin Benes, PA-C  acetaminophen  (TYLENOL ) 500 MG tablet Take 500 mg by mouth every morning.    [provider]  acyclovir  (ZOVIRAX ) 400 MG tablet Take 1 tablet (400 mg total) by mouth 2 (two) times daily. 12/17/23   Dorsey, John T IV, MD  ALPRAZOLAM PO Take 0.25 mg by mouth daily as needed.    [provider]  amiodarone  (PACERONE ) 200 MG tablet Take 0.5 tablets (100 mg total) by mouth daily. 11/26/23   Knox Perl, MD  amLODipine  (NORVASC ) 5 MG tablet Take 5 mg by mouth daily.    [provider]  apixaban  (ELIQUIS ) 5 MG TABS tablet Take 1 tablet (5 mg total) by mouth 2 (two) times daily. 11/15/23   Knox Perl, MD  Artificial Tear Solution (SOOTHE XP) SOLN Place 1 drop into both eyes every evening.    [provider]  Cholecalciferol  (VITAMIN D3) 50 MCG (2000 UT) capsule Take 1 capsule (2,000 Units total) by mouth daily. Patient taking differently: Take 1,000 Units by mouth daily. 09/11/22   Ander Bame, MD  [facility] VITAMIN  B12 1000 MCG tablet TAKE 1 TABLET BY MOUTH EVERY DAY 02/15/23   Thayil, Irene T, PA-C  docusate sodium  (COLACE) 100 MG capsule Take 100 mg by mouth daily as needed for mild constipation.    [provider]  furosemide  (LASIX ) 20 MG tablet Take 1 tablet (20 mg total) by mouth as needed for edema. 10/23/23 01/21/24  Lamond Pilot, PA  lenalidomide  (REVLIMID ) 10 MG capsule Take 1 capsule (10 mg total) by mouth daily. Arlyss [place] # [id number]  Date Obtained 02/13/24 Take 1 capsule daily for 21 days then none for 7 days Patient not taking: Reported on 02/21/2024 02/13/24   Ander Bame, MD  levothyroxine  (SYNTHROID , LEVOTHROID) 100 MCG tablet Take 100 mcg by mouth daily before breakfast. 12/29/15   [provider]  lidocaine  (LIDODERM ) 5 % Place 1 patch onto the skin daily. Remove & Discard patch within 12 hours or as directed by MD 01/14/24   Lesa Rape, MD  losartan  (COZAAR ) 50 MG tablet Take 1 tablet (50 mg total) by mouth daily. 01/14/24   Lesa Rape, MD  metoprolol  succinate (TOPROL -XL) 50 MG 24 hr tablet Take 1 tablet (50 mg total) by mouth daily. Take with or immediately following a meal. 01/14/24   Lesa Rape, MD  Multiple Vitamins-Minerals (PRESERVISION AREDS) CAPS Take 1 capsule by mouth 2 (two) times daily.    [provider]  oxyCODONE  (OXY IR/ROXICODONE ) 5 MG immediate release tablet Take 1 tablet (5 mg total) by mouth every 6 (six) hours as needed for up to 5 doses for severe pain (pain score 7-10). 01/14/24   Lesa Rape, MD  pantoprazole  (PROTONIX ) 40 MG tablet Take 1 tablet (40 mg total) by mouth daily at 6 (six) AM. 10/03/23   Armenta Landau, MD  polyethylene glycol (MIRALAX  / GLYCOLAX ) 17 g packet Take 17 g by mouth daily as needed for mild constipation.    [provider]  potassium chloride  (KLOR-CON ) 10 MEQ tablet Take 1 tablet (10 mEq total) by mouth as needed (TAKE WITH LASIX  FOR SWELLING). 10/23/23 01/21/24  Lamond Pilot, PA  pravastatin   (PRAVACHOL ) 40 MG tablet Take 40 mg by mouth every evening.    [provider]  traMADol  (ULTRAM ) 50 MG tablet Take 50 mg by mouth daily.    [provider]      Allergies    Quinolones, Penicillins, and Sulfa antibiotics    Review of Systems   Review of Systems  Musculoskeletal:  Positive for neck pain.    Physical Exam Updated Vital Signs BP (!) 144/56 (BP Location: Right Arm)   Pulse 62   Temp 97.8 F (36.6 C) (Oral)   Resp 15   Ht 5\' 3"  (1.6 m)   Wt 74.8 kg   SpO2 95%   BMI 29.23 kg/m  Physical Exam Constitutional:      General: She is not in acute distress. Eyes:     Extraocular Movements: Extraocular movements intact.     Conjunctiva/sclera: Conjunctivae normal.     Pupils: Pupils are equal, round, and reactive to light.  Neck:     Comments: Able to reproduce palpation of left trapezius pain with palpation No midline tenderness No carotid bruits noted Cardiovascular:     Rate and Rhythm: Normal rate and regular rhythm.     Pulses: Normal pulses.     Heart sounds: Murmur (Systolic right second intercostal space 2 out of 6) heard.  Musculoskeletal:     Cervical back: Normal range of motion.     Comments: 5 out of 5 bilateral grip strength, hip flexion  Skin:    General: Skin is warm and dry.  Neurological:     Mental Status: She is alert.     Comments: Able to ambulate with assistance as this is her baseline Cranial nerves III through XII intact Vision grossly intact Sensation equal in bilateral upper and lower extremities     ED Results / Procedures / Treatments   Labs (all labs ordered are listed, but only abnormal results are displayed) Labs Reviewed - No data to display  EKG None  Radiology CT Cervical Spine Wo Contrast Result Date: 02/22/2024 CLINICAL DATA:  Woke up with sharp stabbing neck pain this morning. No reported history of trauma. EXAM: CT CERVICAL SPINE WITHOUT CONTRAST TECHNIQUE: Multidetector CT imaging of the  cervical spine was performed without intravenous contrast. Multiplanar CT image reconstructions were also generated. RADIATION DOSE REDUCTION: This exam was performed according to the departmental dose-optimization program which includes automated exposure control, adjustment of the mA and/or kV according to patient size and/or use of iterative reconstruction technique. COMPARISON:  CT cervical spine 01/08/2024. FINDINGS: Alignment: Cervical lordosis is maintained. Trace anterolisthesis of C5 on C6 likely related to chronic facet degenerative changes. No facet subluxation or dislocation. Skull base and vertebrae: No compression fracture or displaced fracture in the cervical spine. Partial fusion  of the C3-C5 vertebral bodies. Diffuse osteopenia. No suspicious lytic or blastic osseous lesion appreciated. Soft tissues and spinal canal: No prevertebral fluid or swelling. No visible canal hematoma. Atherosclerotic calcifications involving the carotid bifurcations, left greater than right. Disc levels: Intervertebral disc space narrowing at multiple levels. Disc bulges at multiple levels. There is mild osseous spinal canal stenosis at C2-3. Additional disc bulge at C5-6 along with mineralization of the ligamentum flavum resulting in mild spinal canal stenosis. No high-grade osseous spinal canal stenosis. Facet arthrosis at multiple levels. Foraminal narrowing throughout the cervical spine most pronounced on the left at C3-4, bilaterally at C4-5, and on the left at C5-6. Upper chest: Negative. Other: None. IMPRESSION: No acute fracture or traumatic malalignment of the cervical spine. Degenerative changes of the cervical spine as above. Disc bulge at C2-3 resulting in mild osseous spinal canal stenosis. Additional disc bulge and mineralization of the ligamentum flavum at C5-6 resulting in mild osseous spinal canal stenosis. Foraminal narrowing at multiple levels. Consider MRI of the cervical spine for further evaluation if  clinically indicated. Electronically Signed   By: Denny Flack M.D.   On: 02/22/2024 13:01    Procedures Procedures    Medications Ordered in ED Medications  lidocaine  (LIDODERM ) 5 % 1 patch (1 patch Transdermal Patch Applied 02/22/24 0937)  acetaminophen  (TYLENOL ) tablet 650 mg (650 mg Oral Given 02/22/24 8295)    ED Course/ Medical Decision Making/ A&P                                 Medical Decision Making  Yuette  B Hott 89 y.o. presented today for neck pain. Working DDx that I considered at this time includes, but not limited to, MSK, torticollis, dystonic reaction, cervical spondylosis, epidural abscess, myelitis, disc herniation, cervical radiculopathy, carotid/vertebral artery dissection, spinal cord injury, myelitis.  R/o DDx: torticollis, dystonic reaction, cervical spondylosis, epidural abscess, myelitis, cervical radiculopathy, carotid/vertebral artery dissection, spinal cord injury, myelitis: These are considered less likely due to history of present illness, physical exam, labs/imaging findings  Review of prior external notes: 07/10/2022 urology  Unique Tests and My Independent Interpretation:  CT cervical spine without contrast: C2-C3 disc bulging with mild spinal stenosis, multilevel foraminal narrowing  Social Determinants of Health: none  Discussion with Independent Historian: None  Discussion of Management of Tests: None  Risk: Medium: prescription drug management  Risk Stratification Score: None follow-up  Staffed with Kommor, MD  Plan: On exam patient was no acute distress with stable vitals.  Patient did not endorse history of recent neck or dental surgeries or chiropractor use.  On exam patient is neurologically intact and has reassuring neuroexam.  No carotid bruits were noted.  I was able to press on patient's left trapezius muscle and reproduce the pain indicative of possible MSK.  CT scan does show foraminal narrowing with C2-C3 disc bulging.   Patient is denying any shooting pains and does have good strength bilaterally.  At this time do believe the C2-C3 disc bulging is contributing patient's pain along with possible muscle strain as I am able to reproduce her pain when palpating her trapezius muscle.  Patient has no concerning features on exam and after discussion with the attending we both agree patient is safe to be discharged and follow-up with neurosurgery.  Patient was given return precautions. Patient stable for discharge at this time.  Patient verbalized understanding of plan.  This chart was dictated using voice  recognition software.  Despite best efforts to proofread,  errors can occur which can change the documentation meaning.        Final Clinical Impression(s) / ED Diagnoses Final diagnoses:  Bulging of cervical intervertebral disc    Rx / DC Orders ED Discharge Orders          Ordered    lidocaine  (LIDODERM ) 5 %  Every 24 hours        02/22/24 1420              Denese Finn, PA-C 02/22/24 1422    Karlyn Overman, MD 02/23/24 1320

## 2024-02-22 NOTE — ED Triage Notes (Signed)
 States she woke up with sharp stabbing neck pain around 330 this am. Denies unjury.

## 2024-02-22 NOTE — Discharge Instructions (Addendum)
 Please follow-up with the neurosurgeon I have attached here for you today in regards to recent symptoms and ER visit.  Today your CT scan shows you have disc bulging your C2-C3 region and you may have a muscle strain as well.  I have prescribed for you lidocaine  patches and you may take Tylenol  every 6 hours needed for pain.  If symptoms change or worsen please return to the ER.

## 2024-02-23 ENCOUNTER — Encounter (HOSPITAL_BASED_OUTPATIENT_CLINIC_OR_DEPARTMENT_OTHER): Payer: Self-pay | Admitting: Emergency Medicine

## 2024-02-23 ENCOUNTER — Other Ambulatory Visit: Payer: Self-pay

## 2024-02-23 ENCOUNTER — Emergency Department (HOSPITAL_BASED_OUTPATIENT_CLINIC_OR_DEPARTMENT_OTHER)
Admission: EM | Admit: 2024-02-23 | Discharge: 2024-02-23 | Disposition: A | Discharge status: Home / Self Care | Attending: Emergency Medicine | Admitting: Emergency Medicine

## 2024-02-23 ENCOUNTER — Emergency Department (HOSPITAL_BASED_OUTPATIENT_CLINIC_OR_DEPARTMENT_OTHER)

## 2024-02-23 DIAGNOSIS — R2231 Localized swelling, mass and lump, right upper limb: Secondary | ICD-10-CM | POA: Diagnosis not present

## 2024-02-23 DIAGNOSIS — M542 Cervicalgia: Secondary | ICD-10-CM | POA: Diagnosis not present

## 2024-02-23 DIAGNOSIS — M7989 Other specified soft tissue disorders: Secondary | ICD-10-CM | POA: Diagnosis not present

## 2024-02-23 DIAGNOSIS — Z8579 Personal history of other malignant neoplasms of lymphoid, hematopoietic and related tissues: Secondary | ICD-10-CM | POA: Diagnosis not present

## 2024-02-23 DIAGNOSIS — Z7901 Long term (current) use of anticoagulants: Secondary | ICD-10-CM | POA: Insufficient documentation

## 2024-02-23 DIAGNOSIS — M25531 Pain in right wrist: Secondary | ICD-10-CM | POA: Diagnosis not present

## 2024-02-23 DIAGNOSIS — M19031 Primary osteoarthritis, right wrist: Secondary | ICD-10-CM | POA: Diagnosis not present

## 2024-02-23 DIAGNOSIS — L03113 Cellulitis of right upper limb: Secondary | ICD-10-CM | POA: Diagnosis not present

## 2024-02-23 LAB — BASIC METABOLIC PANEL WITH GFR
Anion gap: 15 (ref 5–15)
BUN: 25 mg/dL — ABNORMAL HIGH (ref 8–23)
CO2: 21 mmol/L — ABNORMAL LOW (ref 22–32)
Calcium: 9 mg/dL (ref 8.9–10.3)
Chloride: 99 mmol/L (ref 98–111)
Creatinine, Ser: 0.94 mg/dL (ref 0.44–1.00)
GFR, Estimated: 58 mL/min — ABNORMAL LOW (ref >60)
Glucose, Bld: 116 mg/dL — ABNORMAL HIGH (ref 70–99)
Potassium: 3.4 mmol/L — ABNORMAL LOW (ref 3.5–5.1)
Sodium: 135 mmol/L (ref 135–145)

## 2024-02-23 LAB — CBC WITH DIFFERENTIAL/PLATELET
Abs Immature Granulocytes: 0.03 10*3/uL (ref 0.00–0.07)
Basophils Absolute: 0.1 10*3/uL (ref 0.0–0.1)
Basophils Relative: 1 %
Eosinophils Absolute: 0 10*3/uL (ref 0.0–0.5)
Eosinophils Relative: 0 %
HCT: 35.1 % — ABNORMAL LOW (ref 36.0–46.0)
Hemoglobin: 11.5 g/dL — ABNORMAL LOW (ref 12.0–15.0)
Immature Granulocytes: 0 %
Lymphocytes Relative: 10 %
Lymphs Abs: 0.8 10*3/uL (ref 0.7–4.0)
MCH: 29.3 pg (ref 26.0–34.0)
MCHC: 32.8 g/dL (ref 30.0–36.0)
MCV: 89.3 fL (ref 80.0–100.0)
Monocytes Absolute: 2 10*3/uL — ABNORMAL HIGH (ref 0.1–1.0)
Monocytes Relative: 27 %
Neutro Abs: 4.7 10*3/uL (ref 1.7–7.7)
Neutrophils Relative %: 62 %
Platelets: 176 10*3/uL (ref 150–400)
RBC: 3.93 MIL/uL (ref 3.87–5.11)
RDW: 18.1 % — ABNORMAL HIGH (ref 11.5–15.5)
WBC: 7.6 10*3/uL (ref 4.0–10.5)
nRBC: 0 % (ref 0.0–0.2)

## 2024-02-23 MED ORDER — OXYCODONE HCL 5 MG PO TABS
5.0000 mg | ORAL_TABLET | Freq: Once | ORAL | Status: AC
  Administered 2024-02-23: 5 mg via ORAL
  Filled 2024-02-23: qty 1

## 2024-02-23 MED ORDER — OXYCODONE HCL 5 MG PO TABS: 5.0000 mg | ORAL_TABLET | Freq: Four times a day (QID) | ORAL | 0 refills | Status: DC | PRN

## 2024-02-23 MED ORDER — METHYLPREDNISOLONE 4 MG PO TBPK: ORAL_TABLET | ORAL | 0 refills | Status: DC

## 2024-02-23 NOTE — ED Triage Notes (Signed)
 Right wrist/swelling/painful

## 2024-02-23 NOTE — ED Provider Notes (Signed)
 [place] EMERGENCY DEPARTMENT AT [facility] Provider Note   CSN: [id number] Arrival date & time: 02/23/24  1247     History  Chief Complaint  Patient presents with   Wrist Pain    Zoe [name] is a 89 y.o. female.  Patient here with right wrist pain and swelling started this morning.  History of multiple myeloma amiodarone  and Eliquis .  Chronic neck pain.  Seen yesterday for neck pain which is somewhat improved today woke up in the middle night with right wrist pain and swelling.  She ambulates with a walker.  She denies any fall or traumas.  Sent here for evaluation by urgent care.  She denies any fevers or chills.  She does take an oral chemotherapy for maintenance of her multiple myeloma.  She is never had surgery in the right wrist.  No history of gout.  History of breast cancer.  Denies any weakness numbness tingling.  She can flex and extend at the wrist but with discomfort.  Pain is mostly to the back of her right hand.  Family and patient states that swelling has greatly improved with ice.  The history is provided by the patient.       Home Medications Prior to Admission medications   Medication Sig Start Date End Date Taking? Authorizing Provider  methylPREDNISolone  (MEDROL  DOSEPAK) 4 MG TBPK tablet Follow package insert 02/23/24  Yes Denetria Luevanos, DO  oxyCODONE  (ROXICODONE ) 5 MG immediate release tablet Take 1 tablet (5 mg total) by mouth every 6 (six) hours as needed for up to 10 doses. 02/23/24  Yes Giavanna Kang, DO  acetaminophen  (TYLENOL ) 500 MG tablet Take 500 mg by mouth every morning.    [provider]  acyclovir  (ZOVIRAX ) 400 MG tablet Take 1 tablet (400 mg total) by mouth 2 (two) times daily. 12/17/23   Dorsey, John T IV, MD  ALPRAZOLAM PO Take 0.25 mg by mouth daily as needed.    [provider]  amiodarone  (PACERONE ) 200 MG tablet Take 0.5 tablets (100 mg total) by mouth daily. 11/26/23   Knox Perl, MD  amLODipine  (NORVASC ) 5 MG  tablet Take 5 mg by mouth daily.    [provider]  apixaban  (ELIQUIS ) 5 MG TABS tablet Take 1 tablet (5 mg total) by mouth 2 (two) times daily. 11/15/23   Knox Perl, MD  Artificial Tear Solution (SOOTHE XP) SOLN Place 1 drop into both eyes every evening.    [provider]  Cholecalciferol  (VITAMIN D3) 50 MCG (2000 UT) capsule Take 1 capsule (2,000 Units total) by mouth daily. Patient taking differently: Take 1,000 Units by mouth daily. 09/11/22   Ander Bame, MD  [employer] VITAMIN B12 1000 MCG tablet TAKE 1 TABLET BY MOUTH EVERY DAY 02/15/23   Thayil, Irene T, PA-C  docusate sodium  (COLACE) 100 MG capsule Take 100 mg by mouth daily as needed for mild constipation.    [provider]  furosemide  (LASIX ) 20 MG tablet Take 1 tablet (20 mg total) by mouth as needed for edema. 10/23/23 01/21/24  Lamond Pilot, PA  lenalidomide  (REVLIMID ) 10 MG capsule Take 1 capsule (10 mg total) by mouth daily. Arlyss Berkeley # [id number]  Date Obtained 02/13/24 Take 1 capsule daily for 21 days then none for 7 days Patient not taking: Reported on 02/21/2024 02/13/24   Ander Bame, MD  levothyroxine  (SYNTHROID , LEVOTHROID) 100 MCG tablet Take 100 mcg by mouth daily before breakfast. 12/29/15   [provider]  lidocaine  (LIDODERM ) 5 % Place 1 patch onto the skin daily. Remove & Discard patch within 12 hours or as directed by MD 01/14/24   Lesa Rape, MD  lidocaine  (LIDODERM ) 5 % Place 1 patch onto the skin daily. Remove & Discard patch within 12 hours or as directed by MD 02/22/24   Denese Finn, PA-C  losartan  (COZAAR ) 50 MG tablet Take 1 tablet (50 mg total) by mouth daily. 01/14/24   Lesa Rape, MD  metoprolol  succinate (TOPROL -XL) 50 MG 24 hr tablet Take 1 tablet (50 mg total) by mouth daily. Take with or immediately following a meal. 01/14/24   Lesa Rape, MD  Multiple Vitamins-Minerals (PRESERVISION AREDS) CAPS Take 1 capsule by mouth 2 (two) times daily.    [provider]  pantoprazole  (PROTONIX ) 40 MG tablet Take 1 tablet (40 mg total) by mouth daily at 6 (six) AM. 10/03/23   Armenta Landau, MD  polyethylene glycol (MIRALAX  / GLYCOLAX ) 17 g packet Take 17 g by mouth daily as needed for mild constipation.    [provider]  potassium chloride  (KLOR-CON ) 10 MEQ tablet Take 1 tablet (10 mEq total) by mouth as needed (TAKE WITH LASIX  FOR SWELLING). 10/23/23 01/21/24  Lamond Pilot, PA  pravastatin  (PRAVACHOL ) 40 MG tablet Take 40 mg by mouth every evening.    [provider]  traMADol  (ULTRAM ) 50 MG tablet Take 50 mg by mouth daily.    [provider]      Allergies    Quinolones, Penicillins, and Sulfa antibiotics    Review of Systems   Review of Systems  Physical Exam Updated Vital Signs BP (!) 112/92   Pulse 67   Temp 98 F (36.7 C) (Oral)   Resp 16   SpO2 98%  Physical Exam Vitals and nursing note reviewed.  Constitutional:      General: She is not in acute distress.    Appearance: She is well-developed.  HENT:     Head: Normocephalic and atraumatic.     Nose: Nose normal.     Mouth/Throat:     Mouth: Mucous membranes are moist.  Eyes:     Extraocular Movements: Extraocular movements intact.     Conjunctiva/sclera: Conjunctivae normal.     Pupils: Pupils are equal, round, and reactive to light.  Cardiovascular:     Rate and Rhythm: Normal rate and regular rhythm.     Pulses: Normal pulses.     Heart sounds: No murmur heard. Pulmonary:     Effort: Pulmonary effort is normal. No respiratory distress.     Breath sounds: Normal breath sounds.  Abdominal:     Palpations: Abdomen is soft.     Tenderness: There is no abdominal tenderness.  Musculoskeletal:        General: Swelling and tenderness present.     Cervical back: Neck supple.     Comments: Some tenderness and mild swelling to the back of the right hand more over the metacarpals and over the wrist joint she can flex and extend at the right  wrist with some discomfort  Skin:    General: Skin is warm and dry.     Capillary Refill: Capillary refill takes less than 2 seconds.  Neurological:     General: No focal deficit present.     Mental Status: She is alert and oriented to person, place, and time.     Cranial Nerves: No cranial nerve deficit.     Sensory: No sensory deficit.  Motor: No weakness.     Coordination: Coordination normal.  Psychiatric:        Mood and Affect: Mood normal.     ED Results / Procedures / Treatments   Labs (all labs ordered are listed, but only abnormal results are displayed) Labs Reviewed  CBC WITH DIFFERENTIAL/PLATELET - Abnormal; Notable for the following components:      Result Value   Hemoglobin 11.5 (*)    HCT 35.1 (*)    RDW 18.1 (*)    Monocytes Absolute 2.0 (*)    All other components within normal limits  BASIC METABOLIC PANEL WITH GFR    EKG None  Radiology DG Wrist Complete Right Result Date: 02/23/2024 CLINICAL DATA:  Right wrist pain and swelling EXAM: RIGHT WRIST - COMPLETE 3+ VIEW COMPARISON:  None Available. FINDINGS: The osseous structures are diffusely osteopenic. No acute fracture or dislocation. Advanced polyarticular degenerative changes are identified, most severe within the triscaphe, first CMC, and first MCP joint of the thumb. Degenerative chondrocalcinosis of the TFCC. There is extensive soft tissue swelling surrounding the carpus. No retained radiopaque foreign body. Vascular calcifications noted. IMPRESSION: 1. Soft tissue swelling. No acute fracture or dislocation. 2. Advanced polyarticular degenerative joint disease. Electronically Signed   By: Worthy Heads M.D.   On: 02/23/2024 13:24   CT Cervical Spine Wo Contrast Result Date: 02/22/2024 CLINICAL DATA:  Woke up with sharp stabbing neck pain this morning. No reported history of trauma. EXAM: CT CERVICAL SPINE WITHOUT CONTRAST TECHNIQUE: Multidetector CT imaging of the cervical spine was performed without  intravenous contrast. Multiplanar CT image reconstructions were also generated. RADIATION DOSE REDUCTION: This exam was performed according to the departmental dose-optimization program which includes automated exposure control, adjustment of the mA and/or kV according to patient size and/or use of iterative reconstruction technique. COMPARISON:  CT cervical spine 01/08/2024. FINDINGS: Alignment: Cervical lordosis is maintained. Trace anterolisthesis of C5 on C6 likely related to chronic facet degenerative changes. No facet subluxation or dislocation. Skull base and vertebrae: No compression fracture or displaced fracture in the cervical spine. Partial fusion of the C3-C5 vertebral bodies. Diffuse osteopenia. No suspicious lytic or blastic osseous lesion appreciated. Soft tissues and spinal canal: No prevertebral fluid or swelling. No visible canal hematoma. Atherosclerotic calcifications involving the carotid bifurcations, left greater than right. Disc levels: Intervertebral disc space narrowing at multiple levels. Disc bulges at multiple levels. There is mild osseous spinal canal stenosis at C2-3. Additional disc bulge at C5-6 along with mineralization of the ligamentum flavum resulting in mild spinal canal stenosis. No high-grade osseous spinal canal stenosis. Facet arthrosis at multiple levels. Foraminal narrowing throughout the cervical spine most pronounced on the left at C3-4, bilaterally at C4-5, and on the left at C5-6. Upper chest: Negative. Other: None. IMPRESSION: No acute fracture or traumatic malalignment of the cervical spine. Degenerative changes of the cervical spine as above. Disc bulge at C2-3 resulting in mild osseous spinal canal stenosis. Additional disc bulge and mineralization of the ligamentum flavum at C5-6 resulting in mild osseous spinal canal stenosis. Foraminal narrowing at multiple levels. Consider MRI of the cervical spine for further evaluation if clinically indicated. Electronically  Signed   By: Denny Flack M.D.   On: 02/22/2024 13:01    Procedures Procedures    Medications Ordered in ED Medications  oxyCODONE  (Oxy IR/ROXICODONE ) immediate release tablet 5 mg (5 mg Oral Given 02/23/24 1317)    ED Course/ Medical Decision Making/ A&P  Medical Decision Making Amount and/or Complexity of Data Reviewed Labs: ordered. Radiology: ordered.  Risk Prescription drug management.   Zoe [name] is here with pain to the back of the right hand.  History of multiple myeloma, arthritis, valvular heart disease on amiodarone  and Eliquis .  Patient seen yesterday in the ED for neck pain.  Woke up in the middle of the night with some pain and swelling to the back of her right hand.  I do not see any major swelling over the right wrist joint but there is some mild involvement there.  She can flex and extend at the wrist with some discomfort.  She has no history of injury or surgery to the right wrist.  She does ambulate with a walker.  Differential diagnosis likely an inflammatory arthritis.  Likely from using a walker.  Clinically I have very low suspicion for septic joint.  She has no fever.  Swelling seems more localized to the hand rather than the wrist as well.  But will get x-ray check basic labs give her oxycodone  and reevaluate.  She is neurovascular neuromuscular intact otherwise.  Neck pains improved today.  She had unremarkable CT of the neck yesterday.  No significant leukocytosis anemia or electrolyte abnormality per my review interpretation of labs.  X-ray per radiology report shows soft tissue swelling and polyarticular joint disease.  Seems like there is a lot of soft tissue swelling around the carpal bones which is what I see clinically on exam.  Overall there is no obvious joint effusion.  I do think that this is arthritis inflammatory process.  Will treat with Medrol  Dosepak Roxicodone  for breakthrough pain ice and wrist splint.   Will have her follow-up with primary care and hand doctor.  Told to return if symptoms worsen.  We did talk about infection processes but I have very low suspicion for that but she understands return precautions including fever worsening swelling.  Discharge.  This chart was dictated using voice recognition software.  Despite best efforts to proofread,  errors can occur which can change the documentation meaning.         Final Clinical Impression(s) / ED Diagnoses Final diagnoses:  Right wrist pain    Rx / DC Orders ED Discharge Orders          Ordered    methylPREDNISolone  (MEDROL  DOSEPAK) 4 MG TBPK tablet        02/23/24 1401    oxyCODONE  (ROXICODONE ) 5 MG immediate release tablet  Every 6 hours PRN        02/23/24 1401              Chales Pelissier, DO 02/23/24 1403

## 2024-02-23 NOTE — Discharge Instructions (Addendum)
 Overall I do suspect you have an inflammatory arthritis process going on your wrist.  Use the splint for comfort.  Recommend ice and Tylenol  for pain.  I have written you for a narcotic pain medicine called Roxicodone  for breakthrough pain.  This medication is sedating so please be careful with its use.  Do not drive or do any dangerous activities.  I have also prescribed you steroids called Medrol  Dosepak.  Please return if symptoms worsen as discussed.

## 2024-02-25 ENCOUNTER — Ambulatory Visit

## 2024-02-25 ENCOUNTER — Inpatient Hospital Stay

## 2024-02-25 DIAGNOSIS — I442 Atrioventricular block, complete: Secondary | ICD-10-CM

## 2024-02-25 LAB — CUP PACEART REMOTE DEVICE CHECK
Battery Remaining Longevity: 170 mo
Battery Voltage: 3.22 V
Brady Statistic AP VP Percent: 11.4 %
Brady Statistic AP VS Percent: 17.19 %
Brady Statistic AS VP Percent: 30.05 %
Brady Statistic AS VS Percent: 41.35 %
Brady Statistic RA Percent Paced: 28.57 %
Brady Statistic RV Percent Paced: 41.45 %
Date Time Interrogation Session: 20250505033011
Implantable Lead Connection Status: 753985
Implantable Lead Connection Status: 753985
Implantable Lead Implant Date: 20250319
Implantable Lead Implant Date: 20250319
Implantable Lead Location: 753859
Implantable Lead Location: 753860
Implantable Lead Model: 3830
Implantable Lead Model: 5076
Implantable Pulse Generator Implant Date: 20250319
Lead Channel Impedance Value: 323 Ohm
Lead Channel Impedance Value: 380 Ohm
Lead Channel Impedance Value: 475 Ohm
Lead Channel Impedance Value: 551 Ohm
Lead Channel Pacing Threshold Amplitude: 0.875 V
Lead Channel Pacing Threshold Amplitude: 1.25 V
Lead Channel Pacing Threshold Pulse Width: 0.4 ms
Lead Channel Pacing Threshold Pulse Width: 0.4 ms
Lead Channel Sensing Intrinsic Amplitude: 10.625 mV
Lead Channel Sensing Intrinsic Amplitude: 10.625 mV
Lead Channel Sensing Intrinsic Amplitude: 3.375 mV
Lead Channel Sensing Intrinsic Amplitude: 3.375 mV
Lead Channel Setting Pacing Amplitude: 1.5 V
Lead Channel Setting Pacing Amplitude: 2.75 V
Lead Channel Setting Pacing Pulse Width: 0.4 ms
Lead Channel Setting Sensing Sensitivity: 1.2 mV
Zone Setting Status: 755011

## 2024-02-26 ENCOUNTER — Encounter: Payer: Self-pay | Admitting: Internal Medicine

## 2024-02-27 ENCOUNTER — Other Ambulatory Visit: Payer: Self-pay | Admitting: Physician Assistant

## 2024-02-27 DIAGNOSIS — C9000 Multiple myeloma not having achieved remission: Secondary | ICD-10-CM

## 2024-02-28 ENCOUNTER — Inpatient Hospital Stay

## 2024-02-28 ENCOUNTER — Inpatient Hospital Stay: Attending: Physician Assistant | Admitting: Physician Assistant

## 2024-02-28 VITALS — BP 110/80 | HR 63 | Temp 97.5°F | Resp 16 | Ht 63.0 in | Wt 161.4 lb

## 2024-02-28 DIAGNOSIS — Z79899 Other long term (current) drug therapy: Secondary | ICD-10-CM | POA: Insufficient documentation

## 2024-02-28 DIAGNOSIS — Z9221 Personal history of antineoplastic chemotherapy: Secondary | ICD-10-CM | POA: Diagnosis not present

## 2024-02-28 DIAGNOSIS — D649 Anemia, unspecified: Secondary | ICD-10-CM | POA: Diagnosis not present

## 2024-02-28 DIAGNOSIS — I4891 Unspecified atrial fibrillation: Secondary | ICD-10-CM | POA: Insufficient documentation

## 2024-02-28 DIAGNOSIS — Z950 Presence of cardiac pacemaker: Secondary | ICD-10-CM | POA: Insufficient documentation

## 2024-02-28 DIAGNOSIS — Z7901 Long term (current) use of anticoagulants: Secondary | ICD-10-CM | POA: Insufficient documentation

## 2024-02-28 DIAGNOSIS — R251 Tremor, unspecified: Secondary | ICD-10-CM | POA: Diagnosis not present

## 2024-02-28 DIAGNOSIS — E538 Deficiency of other specified B group vitamins: Secondary | ICD-10-CM | POA: Insufficient documentation

## 2024-02-28 DIAGNOSIS — C9000 Multiple myeloma not having achieved remission: Secondary | ICD-10-CM

## 2024-02-28 DIAGNOSIS — I442 Atrioventricular block, complete: Secondary | ICD-10-CM | POA: Insufficient documentation

## 2024-02-28 LAB — CMP (CANCER CENTER ONLY)
ALT: 19 U/L (ref 0–44)
AST: 18 U/L (ref 15–41)
Albumin: 3.7 g/dL (ref 3.5–5.0)
Alkaline Phosphatase: 107 U/L (ref 38–126)
Anion gap: 7 (ref 5–15)
BUN: 30 mg/dL — ABNORMAL HIGH (ref 8–23)
CO2: 31 mmol/L (ref 22–32)
Calcium: 8.6 mg/dL — ABNORMAL LOW (ref 8.9–10.3)
Chloride: 101 mmol/L (ref 98–111)
Creatinine: 1.06 mg/dL — ABNORMAL HIGH (ref 0.44–1.00)
GFR, Estimated: 51 mL/min — ABNORMAL LOW (ref >60)
Glucose, Bld: 148 mg/dL — ABNORMAL HIGH (ref 70–99)
Potassium: 3.7 mmol/L (ref 3.5–5.1)
Sodium: 139 mmol/L (ref 135–145)
Total Bilirubin: 0.5 mg/dL (ref 0.0–1.2)
Total Protein: 6.5 g/dL (ref 6.5–8.1)

## 2024-02-28 LAB — CBC WITH DIFFERENTIAL (CANCER CENTER ONLY)
Abs Immature Granulocytes: 0.03 10*3/uL (ref 0.00–0.07)
Basophils Absolute: 0 10*3/uL (ref 0.0–0.1)
Basophils Relative: 0 %
Eosinophils Absolute: 0 10*3/uL (ref 0.0–0.5)
Eosinophils Relative: 0 %
HCT: 38.1 % (ref 36.0–46.0)
Hemoglobin: 12.7 g/dL (ref 12.0–15.0)
Immature Granulocytes: 0 %
Lymphocytes Relative: 8 %
Lymphs Abs: 0.6 10*3/uL — ABNORMAL LOW (ref 0.7–4.0)
MCH: 28.3 pg (ref 26.0–34.0)
MCHC: 33.3 g/dL (ref 30.0–36.0)
MCV: 85 fL (ref 80.0–100.0)
Monocytes Absolute: 0.7 10*3/uL (ref 0.1–1.0)
Monocytes Relative: 9 %
Neutro Abs: 6.6 10*3/uL (ref 1.7–7.7)
Neutrophils Relative %: 83 %
Platelet Count: 270 10*3/uL (ref 150–400)
RBC: 4.48 MIL/uL (ref 3.87–5.11)
RDW: 17.4 % — ABNORMAL HIGH (ref 11.5–15.5)
WBC Count: 8 10*3/uL (ref 4.0–10.5)
nRBC: 0 % (ref 0.0–0.2)

## 2024-02-28 LAB — LACTATE DEHYDROGENASE: LDH: 204 U/L — ABNORMAL HIGH (ref 98–192)

## 2024-02-29 LAB — KAPPA/LAMBDA LIGHT CHAINS
Kappa free light chain: 22.8 mg/L — ABNORMAL HIGH (ref 3.3–19.4)
Kappa, lambda light chain ratio: 1.39 (ref 0.26–1.65)
Lambda free light chains: 16.4 mg/L (ref 5.7–26.3)

## 2024-03-02 ENCOUNTER — Encounter: Payer: Self-pay | Admitting: Physician Assistant

## 2024-03-02 NOTE — Progress Notes (Signed)
 [facility] [phone]   [phone]) [phone]   PROGRESS NOTE   Patient Care Team: [facility] as PCP - General (Rheumatology)   Hematological/Oncological History # IgG Lambda Multiple Myeloma  06/14/2022: establish care with [facility] due to anemia. Labs showed M protein 4.9, Kappa 7.2, Lambda 10.8, ratio 0.67 07/13/2022: Bmbx showed Lambda restricted plasma cell neoplasm involving approximately 60% of the cellular marrow by IHC on the biopsy.  08/01/2022: Cycle 1 Day 1 of VRd chemotherapy. (Holding revlimid  initially) 08/21/2022:  Cycle 2 Day 1 of VRd chemotherapy. (Holding revlimid ) 09/04/2022: Cycle 3 Day 1 of VRd chemotherapy. Started revlimid  10/03/2022: Cycle 4 Day 1 of VRd chemotherapy 10/31/2022: Cycle 5 Day 1 of VRd chemotherapy 11/20/2022: Cycle 6 Day 1 of VRd chemotherapy 12/11/2022: Cycle 7 Day 1 of VRd chemotherapy 01/02/2023: Cycle 8 Day 1 of VRd chemotherapy 01/22/2023: Cycle 9 Day 1 of VRd chemotherapy 02/23/2023: Day 1 Cycle 1 of Dara/Rev/Dex 03/23/2023: Day 1 Cycle 2 of Dara/Rev/Dex 05/06/2023: chemotherapy on pause due to chest pain/cardiac/respiratory issues.  05/18/2023: Cycle 3 Day 1 of Dara/Rev/Dex 06/15/2023: Cycle 4 Day 1 of Dara/Rev/Dex 07/13/2023: Cycle 5 Day 1 of Dara/Rev/Dex 08/10/2023: Cycle 6 Day 1 of Dara/Rev/Dex. Transition to maintenance revlimid .     INTERVAL HISTORY: Zoe  B [name] 89 y.o. female returns to the clinic today for a follow-up visit accompanied by her daughter-in-law.   Her last visit was on 01/01/2024. In the interim, she was hospitalized in March 2023 due to complete heart block and underwent pacemaker placement.   [name] reports has held her Revlimid  therapy with her recent hospitalization and has yet to restart the medication.  [name] is slowly recovering and energy levels have mildly improved.  Will perform full levels.  Will follow, vomiting or bowel habit changes.  He denies any  signs of overt bleeding including hematochezia or melena.  Denies fevers, chills, night sweats, shortness of breath, chest pain or cough.  She has no other complaints.  A full 10 point ROS is otherwise negative.  MEDICAL HISTORY: Past Medical History:  Diagnosis Date   Arthritis    some - per patient   Breast cancer (HCC)    breast cancer / left    Cataract    bilat    GERD (gastroesophageal reflux disease)    History of kidney stones    Hyperlipidemia    Hypertension    Hypothyroidism    Macular degeneration    Left   S/P TAVR (transcatheter aortic valve replacement) 09/03/2018   23 mm Edwards Sapien 3 transcatheter heart valve placed via percutaneous right transfemoral approach    Severe aortic stenosis    Stress incontinence    Thyroid  disease    Tinnitus     ALLERGIES:  is allergic to quinolones, penicillins, and sulfa antibiotics.  MEDICATIONS:  Current Outpatient Medications  Medication Sig Dispense Refill   acetaminophen  (TYLENOL ) 500 MG tablet Take 500 mg by mouth every morning.     acyclovir  (ZOVIRAX ) 400 MG tablet Take 1 tablet (400 mg total) by mouth 2 (two) times daily. 180 tablet 1   ALPRAZOLAM PO Take 0.25 mg by mouth daily as needed. (Patient not taking: Reported on 02/28/2024)     amiodarone  (PACERONE ) 200 MG tablet Take 0.5 tablets (100 mg total) by mouth daily. 45 tablet 3   amLODipine  (NORVASC ) 5 MG tablet Take 5 mg by mouth daily.     apixaban  (ELIQUIS ) 5 MG TABS tablet Take 1 tablet (5  mg total) by mouth 2 (two) times daily. 180 tablet 1   Artificial Tear Solution (SOOTHE XP) SOLN Place 1 drop into both eyes every evening.     Cholecalciferol  (VITAMIN D3) 50 MCG (2000 UT) capsule Take 1 capsule (2,000 Units total) by mouth daily. (Patient taking differently: Take 1,000 Units by mouth daily.)     [employer] VITAMIN B12 1000 MCG tablet TAKE 1 TABLET BY MOUTH EVERY DAY 90 tablet 1   docusate sodium  (COLACE) 100 MG capsule Take 100 mg by mouth daily as needed for mild  constipation.     furosemide  (LASIX ) 20 MG tablet Take 1 tablet (20 mg total) by mouth as needed for edema.     lenalidomide  (REVLIMID ) 10 MG capsule Take 1 capsule (10 mg total) by mouth daily. [employer] Auth # [id number]  Date Obtained 02/13/24 Take 1 capsule daily for 21 days then none for 7 days (Patient not taking: Reported on 02/21/2024) 21 capsule 0   levothyroxine  (SYNTHROID , LEVOTHROID) 100 MCG tablet Take 100 mcg by mouth daily before breakfast.  2   lidocaine  (LIDODERM ) 5 % Place 1 patch onto the skin daily. Remove & Discard patch within 12 hours or as directed by MD     lidocaine  (LIDODERM ) 5 % Place 1 patch onto the skin daily. Remove & Discard patch within 12 hours or as directed by MD 30 patch 0   losartan  (COZAAR ) 50 MG tablet Take 1 tablet (50 mg total) by mouth daily.     methylPREDNISolone  (MEDROL  DOSEPAK) 4 MG TBPK tablet Follow package insert 21 each 0   metoprolol  succinate (TOPROL -XL) 50 MG 24 hr tablet Take 1 tablet (50 mg total) by mouth daily. Take with or immediately following a meal.     Multiple Vitamins-Minerals (PRESERVISION AREDS) CAPS Take 1 capsule by mouth 2 (two) times daily.     oxyCODONE  (ROXICODONE ) 5 MG immediate release tablet Take 1 tablet (5 mg total) by mouth every 6 (six) hours as needed for up to 10 doses. 10 tablet 0   pantoprazole  (PROTONIX ) 40 MG tablet Take 1 tablet (40 mg total) by mouth daily at 6 (six) AM. 30 tablet 0   polyethylene glycol (MIRALAX  / GLYCOLAX ) 17 g packet Take 17 g by mouth daily as needed for mild constipation.     potassium chloride  (KLOR-CON ) 10 MEQ tablet Take 1 tablet (10 mEq total) by mouth as needed (TAKE WITH LASIX  FOR SWELLING).     pravastatin  (PRAVACHOL ) 40 MG tablet Take 40 mg by mouth every evening.     traMADol  (ULTRAM ) 50 MG tablet Take 50 mg by mouth daily.     No current facility-administered medications for this visit.    SURGICAL HISTORY:  Past Surgical History:  Procedure Laterality Date   ABDOMINAL HYSTERECTOMY   1970's   BACK SURGERY     BREAST LUMPECTOMY  12/1998   lumpectomy   CARDIAC CATHETERIZATION     CARDIOVERSION N/A 04/18/2023   Procedure: CARDIOVERSION;  Surgeon: Olinda Bertrand, DO;  Location: [facility] INVASIVE CV LAB;  Service: Cardiovascular;  Laterality: N/A;   CARDIOVERSION N/A 04/20/2023   Procedure: CARDIOVERSION;  Surgeon: Knox Perl, MD;  Location: [facility] INVASIVE CV LAB;  Service: Cardiovascular;  Laterality: N/A;   EYE SURGERY     cataract surgery bilat    INTRAOPERATIVE TRANSTHORACIC ECHOCARDIOGRAM N/A 09/03/2018   Procedure: INTRAOPERATIVE TRANSTHORACIC ECHOCARDIOGRAM;  Surgeon: Odie Benne, MD;  Location: [facility] OR;  Service: Open Heart Surgery;  Laterality: N/A;   KYPHOPLASTY N/A 09/07/2022  Procedure: THORACIC EIGHT KYPHOPLASTY;  Surgeon: Mort Ards, MD;  Location: [facility] OR;  Service: Orthopedics;  Laterality: N/A;  1 hr Local with [facility] 3 C-Bed   LITHOTRIPSY     PACEMAKER IMPLANT N/A 01/09/2024   Procedure: PACEMAKER IMPLANT;  Surgeon: Tammie Fall, MD;  Location: [facility] INVASIVE CV LAB;  Service: Cardiovascular;  Laterality: N/A;   Right total knee     2018 Dr. Bernard Brick   RIGHT/LEFT HEART CATH AND CORONARY ANGIOGRAPHY N/A 08/06/2018   Procedure: RIGHT/LEFT HEART CATH AND CORONARY ANGIOGRAPHY;  Surgeon: Knox Perl, MD;  Location: [facility] INVASIVE CV LAB;  Service: Cardiovascular;  Laterality: N/A;   THYROIDECTOMY, PARTIAL  1975   TONSILLECTOMY     as a child - patient not sure of exact date   TOTAL KNEE ARTHROPLASTY Left 03/13/2016   Procedure: TOTAL KNEE ARTHROPLASTY;  Surgeon: Claiborne Crew, MD;  Location: [facility] ORS;  Service: Orthopedics;  Laterality: Left;   TOTAL KNEE ARTHROPLASTY Right 06/18/2017   Procedure: RIGHT TOTAL KNEE ARTHROPLASTY;  Surgeon: Claiborne Crew, MD;  Location: [facility] ORS;  Service: Orthopedics;  Laterality: Right;   TRANSCATHETER AORTIC VALVE REPLACEMENT, TRANSFEMORAL N/A 09/03/2018   Procedure: TRANSCATHETER AORTIC VALVE REPLACEMENT, TRANSFEMORAL;  Surgeon:  Odie Benne, MD;  Location: [facility] OR;  Service: Open Heart Surgery;  Laterality: N/A;   REVIEW OF SYSTEMS:   Constitutional: ( - ) fevers, ( - )  chills , ( - ) night sweats Eyes: ( - ) blurriness of vision, ( - ) double vision, ( - ) watery eyes Ears, nose, mouth, throat, and face: ( - ) mucositis, ( - ) sore throat Respiratory: ( -) cough, ( - ) dyspnea, ( - ) wheezes Cardiovascular: ( - ) palpitation, ( - ) chest discomfort, ( +) lower extremity swelling Gastrointestinal:  ( - ) nausea, ( - ) heartburn, ( - ) change in bowel habits Skin: ( - ) abnormal skin rashes Lymphatics: ( - ) new lymphadenopathy, ( - ) easy bruising Neurological: ( - ) numbness, ( - ) tingling, ( - ) new weaknesses Behavioral/Psych: ( - ) mood change, ( - ) new changes  All other systems were reviewed with the patient and are negative.   PHYSICAL EXAMINATION:  Blood pressure 110/80, pulse 63, temperature (!) 97.5 F (36.4 C), temperature source Temporal, resp. rate 16, height 5\' 3"  (1.6 m), weight 161 lb 6.4 oz (73.2 kg), SpO2 99%.  ECOG PERFORMANCE STATUS: 1  GENERAL: well appearing elderly Caucasian female, alert, no distress and comfortable SKIN: skin color, texture, turgor are normal, no rashes or significant lesions EYES: conjunctiva are pink and non-injected, sclera clear LUNGS:normal breathing effort.  HEART: irregular rhythm ,normal rate and no murmurs.  Musculoskeletal: no cyanosis of digits and no clubbing  PSYCH: alert & oriented x 3, fluent speech NEURO: no focal motor/sensory deficits    LABORATORY DATA: Lab Results  Component Value Date   WBC 8.0 02/28/2024   HGB 12.7 02/28/2024   HCT 38.1 02/28/2024   MCV 85.0 02/28/2024   PLT 270 02/28/2024      Chemistry      Component Value Date/Time   NA 139 02/28/2024 1327   NA 130 (L) 06/19/2022 1012   K 3.7 02/28/2024 1327   CL 101 02/28/2024 1327   CO2 31 02/28/2024 1327   BUN 30 (H) 02/28/2024 1327   BUN 21 06/19/2022 1012    CREATININE 1.06 (H) 02/28/2024 1327      Component Value Date/Time  CALCIUM  8.6 (L) 02/28/2024 1327   ALKPHOS 107 02/28/2024 1327   AST 18 02/28/2024 1327   ALT 19 02/28/2024 1327   BILITOT 0.5 02/28/2024 1327       RADIOGRAPHIC STUDIES:  CUP PACEART REMOTE DEVICE CHECK Result Date: 02/25/2024 PPM Scheduled remote reviewed. Normal device function.  Presenting rhythm:  AP/VS Next remote 91 days. LA, CVRS  DG Wrist Complete Right Result Date: 02/23/2024 CLINICAL DATA:  Right wrist pain and swelling EXAM: RIGHT WRIST - COMPLETE 3+ VIEW COMPARISON:  None Available. FINDINGS: The osseous structures are diffusely osteopenic. No acute fracture or dislocation. Advanced polyarticular degenerative changes are identified, most severe within the triscaphe, first CMC, and first MCP joint of the thumb. Degenerative chondrocalcinosis of the TFCC. There is extensive soft tissue swelling surrounding the carpus. No retained radiopaque foreign body. Vascular calcifications noted. IMPRESSION: 1. Soft tissue swelling. No acute fracture or dislocation. 2. Advanced polyarticular degenerative joint disease. Electronically Signed   By: Worthy Heads M.D.   On: 02/23/2024 13:24   CT Cervical Spine Wo Contrast Result Date: 02/22/2024 CLINICAL DATA:  Woke up with sharp stabbing neck pain this morning. No reported history of trauma. EXAM: CT CERVICAL SPINE WITHOUT CONTRAST TECHNIQUE: Multidetector CT imaging of the cervical spine was performed without intravenous contrast. Multiplanar CT image reconstructions were also generated. RADIATION DOSE REDUCTION: This exam was performed according to the departmental dose-optimization program which includes automated exposure control, adjustment of the mA and/or kV according to patient size and/or use of iterative reconstruction technique. COMPARISON:  CT cervical spine 01/08/2024. FINDINGS: Alignment: Cervical lordosis is maintained. Trace anterolisthesis of C5 on C6 likely  related to chronic facet degenerative changes. No facet subluxation or dislocation. Skull base and vertebrae: No compression fracture or displaced fracture in the cervical spine. Partial fusion of the C3-C5 vertebral bodies. Diffuse osteopenia. No suspicious lytic or blastic osseous lesion appreciated. Soft tissues and spinal canal: No prevertebral fluid or swelling. No visible canal hematoma. Atherosclerotic calcifications involving the carotid bifurcations, left greater than right. Disc levels: Intervertebral disc space narrowing at multiple levels. Disc bulges at multiple levels. There is mild osseous spinal canal stenosis at C2-3. Additional disc bulge at C5-6 along with mineralization of the ligamentum flavum resulting in mild spinal canal stenosis. No high-grade osseous spinal canal stenosis. Facet arthrosis at multiple levels. Foraminal narrowing throughout the cervical spine most pronounced on the left at C3-4, bilaterally at C4-5, and on the left at C5-6. Upper chest: Negative. Other: None. IMPRESSION: No acute fracture or traumatic malalignment of the cervical spine. Degenerative changes of the cervical spine as above. Disc bulge at C2-3 resulting in mild osseous spinal canal stenosis. Additional disc bulge and mineralization of the ligamentum flavum at C5-6 resulting in mild osseous spinal canal stenosis. Foraminal narrowing at multiple levels. Consider MRI of the cervical spine for further evaluation if clinically indicated. Electronically Signed   By: Denny Flack M.D.   On: 02/22/2024 13:01      ASSESSMENT/PLAN:  Zoe [name] is a 89 y.o. female who presents to the clinic for evaluation for IgG lambda multiple myeloma.   # IgG Lambda Multiple Myeloma  --diagnosis of MM confirmed with bone marrow biopsy showing 60% plasma cells and anemia -- Bone survey shows no lytic lesions, kidney function is within normal limits.  --08/01/2022 was Cycle 1 Day 1 of VRd  -Dr. Rosaline Coma discontinued  velcade  on 02/05/23 due to rash. She is status post day 15 cycle #9.  -Dr. Rosaline Coma changed  the care plan to Daratumumab , Revlimid , and decadron . She is scheduled for day 1 cycle #1 today  -Started Cycle 1, Day 1 of Dara/Rev/Dex on 02/23/2023.  Plan:  --Labs from today show white blood cell 8.0, hemoglobin 12.7, platelets 270, creatinine 1.06, calcium  8.6, LFTs normal.  Multiple myeloma panel pending today. --Okay to hold Revlimid  as patient is recovering from recent hospitalization and settling into residing at [facility].  --RTC in 4 weeks for labs and re-assessment.   #Hypotension/BP/Atrial Fibrillation  --Follows with cardiology, Dr. Berry Bristol --Was recently admitted from 01/08/2024 through 01/14/2024 after having syncopal episode and found to have complete heart block.  She underwent pacemaker placement. --Patient's blood pressure is 110/80 today which is below her baseline. --Advised to check the blood pressure in the morning before taking her antihypertensives and follow-up with Dr. Berry Bristol if her blood pressure is low or she is symptomatic. --Continue on Eliquis  5 mg BID   # Normocytic Anemia #Vitamin B12 Deficiency --Anemia likely driven by multiple myeloma, but may be component of vitamin B12 deficiency as well. --initial labs showed elevated MMA with B12 180 -- Continue vitamin B12 1000 mcg p.o. daily -- Continue to monitor   # H/O Left lower extremity neuropathy: --Currently on gabapentin  900 mg nightly and 600 mg in the morning --Encouraged to try water  aerobics and stretches.  --Following with Dr. Colonel Dears (ortho) to see if she is a candidate for steroid injections.  -- Patient evaluated by Dr. Mark Sil thinks that this may be neuropathy worsened by her Velcade  therapy.   #Episode of right UE tremor and speech abnormality: --Evaluated by Dr. Mark Sil on 03/20/2023. Presumed etiology had been atrial fibrillation, but vascular disease, atheromatous changes, are also possibly implicated  concurrently.  --MRI brain from 03/13/2023 show no acute finding including no evidence of intracranial neoplastic disease. There are chronic small-vessel ischemic changes of the pons, cerebral hemispheric white matter and right cerebellum. --Educated patient to seek immediate evaluate if there are repeat episodes. --No neurological deficits identified per exam today  #Supportive Care -- chemotherapy education complete.  -- port placement not required.  -- zofran  8mg  q8H PRN and compazine  10mg  PO q6H for nausea -- acyclovir  400mg  PO BID for VCZ prophylaxis -- tylenol  1000 mg q8H PRN for back pain.   No orders of the defined types were placed in this encounter.   Patient expressed understanding of the plan provided.   I have spent a total of 30 minutes minutes of face-to-face and non-face-to-face time, preparing to see the patient, performing a medically appropriate examination, counseling and educating the patient, ordering tests/procedures, documenting clinical information in the electronic health record, and care coordination.   [facility] PA-C Dept of Hematology and Oncology [facility] Phone: [phone]

## 2024-03-03 ENCOUNTER — Encounter: Payer: Self-pay | Admitting: Cardiology

## 2024-03-03 ENCOUNTER — Encounter: Payer: Self-pay | Admitting: Physician Assistant

## 2024-03-05 DIAGNOSIS — Z950 Presence of cardiac pacemaker: Secondary | ICD-10-CM | POA: Diagnosis not present

## 2024-03-05 DIAGNOSIS — C9000 Multiple myeloma not having achieved remission: Secondary | ICD-10-CM | POA: Diagnosis not present

## 2024-03-05 DIAGNOSIS — R54 Age-related physical debility: Secondary | ICD-10-CM | POA: Diagnosis not present

## 2024-03-05 DIAGNOSIS — I5032 Chronic diastolic (congestive) heart failure: Secondary | ICD-10-CM | POA: Diagnosis not present

## 2024-03-05 DIAGNOSIS — M489 Spondylopathy, unspecified: Secondary | ICD-10-CM | POA: Diagnosis not present

## 2024-03-05 DIAGNOSIS — M159 Polyosteoarthritis, unspecified: Secondary | ICD-10-CM | POA: Diagnosis not present

## 2024-03-05 DIAGNOSIS — R42 Dizziness and giddiness: Secondary | ICD-10-CM | POA: Diagnosis not present

## 2024-03-05 DIAGNOSIS — H9190 Unspecified hearing loss, unspecified ear: Secondary | ICD-10-CM | POA: Diagnosis not present

## 2024-03-05 DIAGNOSIS — Z954 Presence of other heart-valve replacement: Secondary | ICD-10-CM | POA: Diagnosis not present

## 2024-03-05 DIAGNOSIS — I480 Paroxysmal atrial fibrillation: Secondary | ICD-10-CM | POA: Diagnosis not present

## 2024-03-05 DIAGNOSIS — Z79899 Other long term (current) drug therapy: Secondary | ICD-10-CM | POA: Diagnosis not present

## 2024-03-10 ENCOUNTER — Other Ambulatory Visit: Payer: Self-pay | Admitting: *Deleted

## 2024-03-10 LAB — MULTIPLE MYELOMA PANEL, SERUM
Albumin SerPl Elph-Mcnc: 3.2 g/dL (ref 2.9–4.4)
Albumin/Glob SerPl: 1.2 (ref 0.7–1.7)
Alpha 1: 0.3 g/dL (ref 0.0–0.4)
Alpha2 Glob SerPl Elph-Mcnc: 0.9 g/dL (ref 0.4–1.0)
B-Globulin SerPl Elph-Mcnc: 0.9 g/dL (ref 0.7–1.3)
Gamma Glob SerPl Elph-Mcnc: 0.8 g/dL (ref 0.4–1.8)
Globulin, Total: 2.9 g/dL (ref 2.2–3.9)
IgA: 199 mg/dL (ref 64–422)
IgG (Immunoglobin G), Serum: 838 mg/dL (ref 586–1602)
IgM (Immunoglobulin M), Srm: 56 mg/dL (ref 26–217)
Total Protein ELP: 6.1 g/dL (ref 6.0–8.5)

## 2024-03-10 MED ORDER — LENALIDOMIDE 10 MG PO CAPS: 10.0000 mg | ORAL_CAPSULE | Freq: Every day | ORAL | 0 refills | Status: DC

## 2024-03-11 ENCOUNTER — Encounter (INDEPENDENT_AMBULATORY_CARE_PROVIDER_SITE_OTHER): Admitting: Ophthalmology

## 2024-03-11 DIAGNOSIS — H43813 Vitreous degeneration, bilateral: Secondary | ICD-10-CM | POA: Diagnosis not present

## 2024-03-11 DIAGNOSIS — I10 Essential (primary) hypertension: Secondary | ICD-10-CM

## 2024-03-11 DIAGNOSIS — H353231 Exudative age-related macular degeneration, bilateral, with active choroidal neovascularization: Secondary | ICD-10-CM

## 2024-03-11 DIAGNOSIS — H35033 Hypertensive retinopathy, bilateral: Secondary | ICD-10-CM

## 2024-03-20 ENCOUNTER — Ambulatory Visit (HOSPITAL_COMMUNITY)
Admission: RE | Admit: 2024-03-20 | Discharge: 2024-03-20 | Disposition: A | Discharge status: Home / Self Care | Source: Ambulatory Visit | Attending: Cardiology | Admitting: Cardiology

## 2024-03-20 ENCOUNTER — Ambulatory Visit: Payer: Self-pay | Admitting: Cardiology

## 2024-03-20 DIAGNOSIS — I6522 Occlusion and stenosis of left carotid artery: Secondary | ICD-10-CM | POA: Diagnosis not present

## 2024-03-20 NOTE — Progress Notes (Signed)
 Zoe [name], Your test suggests no further significant blockage in your carotid arteries, no further test is needed

## 2024-03-23 NOTE — Progress Notes (Unsigned)
 [facility] [phone]   [phone]   PROGRESS NOTE   Patient Care Team: Associates, [name] as PCP - General (Rheumatology)   Hematological/Oncological History # IgG Lambda Multiple Myeloma  06/14/2022: establish care with [facility] due to anemia. Labs showed M protein 4.9, Kappa 7.2, Lambda 10.8, ratio 0.67 07/13/2022: Bmbx showed Lambda restricted plasma cell neoplasm involving approximately 60% of the cellular marrow by IHC on the biopsy.  08/01/2022: Cycle 1 Day 1 of VRd chemotherapy. (Holding revlimid  initially) 08/21/2022:  Cycle 2 Day 1 of VRd chemotherapy. (Holding revlimid ) 09/04/2022: Cycle 3 Day 1 of VRd chemotherapy. Started revlimid  10/03/2022: Cycle 4 Day 1 of VRd chemotherapy 10/31/2022: Cycle 5 Day 1 of VRd chemotherapy 11/20/2022: Cycle 6 Day 1 of VRd chemotherapy 12/11/2022: Cycle 7 Day 1 of VRd chemotherapy 01/02/2023: Cycle 8 Day 1 of VRd chemotherapy 01/22/2023: Cycle 9 Day 1 of VRd chemotherapy 02/23/2023: Day 1 Cycle 1 of Dara/Rev/Dex 03/23/2023: Day 1 Cycle 2 of Dara/Rev/Dex 05/06/2023: chemotherapy on pause due to chest pain/cardiac/respiratory issues.  05/18/2023: Cycle 3 Day 1 of Dara/Rev/Dex 06/15/2023: Cycle 4 Day 1 of Dara/Rev/Dex 07/13/2023: Cycle 5 Day 1 of Dara/Rev/Dex 08/10/2023: Cycle 6 Day 1 of Dara/Rev/Dex. Transition to maintenance revlimid .   INTERVAL HISTORY: Zoe [name] 89 y.o. female returns to the clinic today for a follow-up visit accompanied by her daughter-in-law.   Her last visit was on 02/28/2024. In the interim, she has had no major changes in her health.   Zoe [name] reports she is feeling fair today.  She reports that she did unfortunately have a fall recently where she missed her chair and fell on her bottom.  She reports she has some bruising and is little bit sore.  She is taking Tylenol  for this pain.  She has been holding her Revlimid  therapy and reports she is doing well off the  medication.  She does have some occasional dizziness.  She reports that her energy levels are good and her appetite is strong.  She is doing her best to try to exercise at her inpatient facility.  She notes that she is willing to restart the Revlimid  therapy at full dose after our discussions of the options moving forward.  A full 10 point ROS is otherwise negative.  Today we discussed continuing 10 mg of revlimid , dropping to 5 mg available, or discontinuing the medication altogether.  Patient notes that she would like to restart at full dose.  She would be agreeable to dropping if she is having the symptoms.  She voiced understanding of our findings and plan moving forward.  MEDICAL HISTORY: Past Medical History:  Diagnosis Date   Arthritis    some - per patient   Breast cancer (HCC)    breast cancer / left    Cataract    bilat    GERD (gastroesophageal reflux disease)    History of kidney stones    Hyperlipidemia    Hypertension    Hypothyroidism    Macular degeneration    Left   S/P TAVR (transcatheter aortic valve replacement) 09/03/2018   23 mm Edwards Sapien 3 transcatheter heart valve placed via percutaneous right transfemoral approach    Severe aortic stenosis    Stress incontinence    Thyroid  disease    Tinnitus     ALLERGIES:  is allergic to quinolones, penicillins, and sulfa antibiotics.  MEDICATIONS:  Current Outpatient Medications  Medication Sig Dispense Refill   acetaminophen  (TYLENOL ) 500 MG tablet Take  500 mg by mouth every morning.     acyclovir  (ZOVIRAX ) 400 MG tablet Take 1 tablet (400 mg total) by mouth 2 (two) times daily. 180 tablet 1   ALPRAZOLAM PO Take 0.25 mg by mouth daily as needed. (Patient not taking: Reported on 02/28/2024)     amiodarone  (PACERONE ) 200 MG tablet Take 0.5 tablets (100 mg total) by mouth daily. 45 tablet 3   amLODipine  (NORVASC ) 5 MG tablet Take 5 mg by mouth daily.     apixaban  (ELIQUIS ) 5 MG TABS tablet Take 1 tablet (5 mg total)  by mouth 2 (two) times daily. 180 tablet 1   Artificial Tear Solution (SOOTHE XP) SOLN Place 1 drop into both eyes every evening.     Cholecalciferol  (VITAMIN D3) 50 MCG (2000 UT) capsule Take 1 capsule (2,000 Units total) by mouth daily. (Patient taking differently: Take 1,000 Units by mouth daily.)     [employer] VITAMIN B12 1000 MCG tablet TAKE 1 TABLET BY MOUTH EVERY DAY 90 tablet 1   docusate sodium  (COLACE) 100 MG capsule Take 100 mg by mouth daily as needed for mild constipation.     furosemide  (LASIX ) 20 MG tablet Take 1 tablet (20 mg total) by mouth as needed for edema.     lenalidomide  (REVLIMID ) 10 MG capsule Take 1 capsule (10 mg total) by mouth daily. Auth # [id number]  Date Obtained 03/10/24 Take 1 capsule daily for 21 days then none for 7 days 21 capsule 0   levothyroxine  (SYNTHROID , LEVOTHROID) 100 MCG tablet Take 100 mcg by mouth daily before breakfast.  2   lidocaine  (LIDODERM ) 5 % Place 1 patch onto the skin daily. Remove & Discard patch within 12 hours or as directed by MD 30 patch 0   losartan  (COZAAR ) 50 MG tablet Take 1 tablet (50 mg total) by mouth daily.     methylPREDNISolone  (MEDROL  DOSEPAK) 4 MG TBPK tablet Follow package insert 21 each 0   metoprolol  succinate (TOPROL -XL) 50 MG 24 hr tablet Take 1 tablet (50 mg total) by mouth daily. Take with or immediately following a meal.     Multiple Vitamins-Minerals (PRESERVISION AREDS) CAPS Take 1 capsule by mouth 2 (two) times daily.     oxyCODONE  (ROXICODONE ) 5 MG immediate release tablet Take 1 tablet (5 mg total) by mouth every 6 (six) hours as needed for up to 10 doses. 10 tablet 0   pantoprazole  (PROTONIX ) 40 MG tablet Take 1 tablet (40 mg total) by mouth daily at 6 (six) AM. 30 tablet 0   polyethylene glycol (MIRALAX  / GLYCOLAX ) 17 g packet Take 17 g by mouth daily as needed for mild constipation.     potassium chloride  (KLOR-CON ) 10 MEQ tablet Take 1 tablet (10 mEq total) by mouth as needed (TAKE WITH LASIX  FOR SWELLING).      pravastatin  (PRAVACHOL ) 40 MG tablet Take 40 mg by mouth every evening.     traMADol  (ULTRAM ) 50 MG tablet Take 50 mg by mouth daily.     No current facility-administered medications for this visit.    SURGICAL HISTORY:  Past Surgical History:  Procedure Laterality Date   ABDOMINAL HYSTERECTOMY  1970's   BACK SURGERY     BREAST LUMPECTOMY  12/1998   lumpectomy   CARDIAC CATHETERIZATION     CARDIOVERSION N/A 04/18/2023   Procedure: CARDIOVERSION;  Surgeon: Olinda Bertrand, DO;  Location: [facility];  Service: Cardiovascular;  Laterality: N/A;   CARDIOVERSION N/A 04/20/2023   Procedure: CARDIOVERSION;  Surgeon: Berry Bristol,  Marijean Shouts, MD;  Location: [facility] CV [facility];  Service: Cardiovascular;  Laterality: N/A;   EYE SURGERY     cataract surgery bilat    INTRAOPERATIVE TRANSTHORACIC ECHOCARDIOGRAM N/A 09/03/2018   Procedure: INTRAOPERATIVE TRANSTHORACIC ECHOCARDIOGRAM;  Surgeon: Odie Benne, MD;  Location: [facility] OR;  Service: Open Heart Surgery;  Laterality: N/A;   KYPHOPLASTY N/A 09/07/2022   Procedure: THORACIC EIGHT KYPHOPLASTY;  Surgeon: Mort Ards, MD;  Location: [facility] OR;  Service: Orthopedics;  Laterality: N/A;  1 hr Local with [facility] 3 C-Bed   LITHOTRIPSY     PACEMAKER IMPLANT N/A 01/09/2024   Procedure: PACEMAKER IMPLANT;  Surgeon: Tammie Fall, MD;  Location: [facility];  Service: Cardiovascular;  Laterality: N/A;   Right total knee     2018 Dr. Bernard Brick   RIGHT/LEFT HEART CATH AND CORONARY ANGIOGRAPHY N/A 08/06/2018   Procedure: RIGHT/LEFT HEART CATH AND CORONARY ANGIOGRAPHY;  Surgeon: Knox Perl, MD;  Location: [facility];  Service: Cardiovascular;  Laterality: N/A;   THYROIDECTOMY, PARTIAL  1975   TONSILLECTOMY     as a child - patient not sure of exact date   TOTAL KNEE ARTHROPLASTY Left 03/13/2016   Procedure: TOTAL KNEE ARTHROPLASTY;  Surgeon: Claiborne Crew, MD;  Location: [facility] ORS;  Service: Orthopedics;  Laterality: Left;   TOTAL KNEE ARTHROPLASTY  Right 06/18/2017   Procedure: RIGHT TOTAL KNEE ARTHROPLASTY;  Surgeon: Claiborne Crew, MD;  Location: [facility] ORS;  Service: Orthopedics;  Laterality: Right;   TRANSCATHETER AORTIC VALVE REPLACEMENT, TRANSFEMORAL N/A 09/03/2018   Procedure: TRANSCATHETER AORTIC VALVE REPLACEMENT, TRANSFEMORAL;  Surgeon: Odie Benne, MD;  Location: [facility] OR;  Service: Open Heart Surgery;  Laterality: N/A;   REVIEW OF SYSTEMS:   Constitutional: ( - ) fevers, ( - )  chills , ( - ) night sweats Eyes: ( - ) blurriness of vision, ( - ) double vision, ( - ) watery eyes Ears, nose, mouth, throat, and face: ( - ) mucositis, ( - ) sore throat Respiratory: ( -) cough, ( - ) dyspnea, ( - ) wheezes Cardiovascular: ( - ) palpitation, ( - ) chest discomfort, ( +) lower extremity swelling Gastrointestinal:  ( - ) nausea, ( - ) heartburn, ( - ) change in bowel habits Skin: ( - ) abnormal skin rashes Lymphatics: ( - ) new lymphadenopathy, ( - ) easy bruising Neurological: ( - ) numbness, ( - ) tingling, ( - ) new weaknesses Behavioral/Psych: ( - ) mood change, ( - ) new changes  All other systems were reviewed with the patient and are negative.   PHYSICAL EXAMINATION:  Blood pressure 136/89, pulse 68, temperature 97.8 F (36.6 C), temperature source Temporal, resp. rate 13, weight 163 lb 9.6 oz (74.2 kg), SpO2 100%.  ECOG PERFORMANCE STATUS: 1  GENERAL: well appearing elderly Caucasian female, alert, no distress and comfortable SKIN: skin color, texture, turgor are normal, no rashes or significant lesions EYES: conjunctiva are pink and non-injected, sclera clear LUNGS:normal breathing effort.  HEART: irregular rhythm ,normal rate and no murmurs.  Musculoskeletal: no cyanosis of digits and no clubbing  PSYCH: alert & oriented x 3, fluent speech NEURO: no focal motor/sensory deficits    LABORATORY DATA: Lab Results  Component Value Date   WBC 4.7 03/24/2024   HGB 13.3 03/24/2024   HCT 40.4 03/24/2024   MCV  87.8 03/24/2024   PLT 96 (L) 03/24/2024      Chemistry      Component Value Date/Time  NA 140 03/24/2024 1016   NA 130 (L) 06/19/2022 1012   K 3.8 03/24/2024 1016   CL 105 03/24/2024 1016   CO2 28 03/24/2024 1016   BUN 29 (H) 03/24/2024 1016   BUN 21 06/19/2022 1012   CREATININE 1.17 (H) 03/24/2024 1016      Component Value Date/Time   CALCIUM  9.3 03/24/2024 1016   ALKPHOS 115 03/24/2024 1016   AST 13 (L) 03/24/2024 1016   ALT 9 03/24/2024 1016   BILITOT 0.7 03/24/2024 1016       RADIOGRAPHIC STUDIES:  VAS US  CAROTID Result Date: 03/20/2024 Carotid Arterial Duplex Study Patient Name:  Zoe [name]  Date of Exam:   03/20/2024 Medical Rec #: [id number]            Accession #:    [id number] Date of Birth: 13-Apr-1934             Patient Gender: F Patient Age:   25 years Exam Location:  [address] Procedure:      VAS US  CAROTID Referring Phys: Knox Perl --------------------------------------------------------------------------------  Indications:       Carotid artery disease and Patient denies any cerebrovascular                    symptoms. Risk Factors:      Hypertension, hyperlipidemia, no history of smoking, prior                    MI, prior CVA. Other Factors:     History of TAVR                     History of CKD stage 3. Comparison Study:  Prior carotid duplex exam on 09/21/2022 (outside) right 1-15                    %, left 16-49%. Performing Technologist: Richarda Chance RVT, RDCS (AE), RDMS  Examination Guidelines: A complete evaluation includes B-mode imaging, spectral Doppler, color Doppler, and power Doppler as needed of all accessible portions of each vessel. Bilateral testing is considered an integral part of a complete examination. Limited examinations for reoccurring indications may be performed as noted.  Right Carotid Findings: +----------+--------+--------+--------+------------------+------------------+           PSV cm/sEDV cm/sStenosisPlaque  DescriptionComments           +----------+--------+--------+--------+------------------+------------------+ CCA Prox  148     14                                                   +----------+--------+--------+--------+------------------+------------------+ CCA Mid   65      11              focal and calcificintimal thickening +----------+--------+--------+--------+------------------+------------------+ CCA Distal54      14              diffuse           intimal thickening +----------+--------+--------+--------+------------------+------------------+ ICA Prox  58      14              focal and calcific                   +----------+--------+--------+--------+------------------+------------------+ ICA Mid   72      20      1-39%                                        +----------+--------+--------+--------+------------------+------------------+  ICA Distal54      16                                tortuous           +----------+--------+--------+--------+------------------+------------------+ ECA       77      11              focal and calcific                   +----------+--------+--------+--------+------------------+------------------+ +----------+--------+-------+----------------+-------------------+           PSV cm/sEDV cmsDescribe        Arm Pressure (mmHG) +----------+--------+-------+----------------+-------------------+ [id number]            Multiphasic, [id number]                 +----------+--------+-------+----------------+-------------------+ +---------+--------+--+--------+--+---------+ VertebralPSV cm/s80EDV cm/s17Antegrade +---------+--------+--+--------+--+---------+  Left Carotid Findings: +----------+--------+--------+--------+------------------+------------------+           PSV cm/sEDV cm/sStenosisPlaque DescriptionComments           +----------+--------+--------+--------+------------------+------------------+ CCA Prox  66       11                                                   +----------+--------+--------+--------+------------------+------------------+ CCA Mid   71      11                                                   +----------+--------+--------+--------+------------------+------------------+ CCA Distal51      9               focal and calcificintimal thickening +----------+--------+--------+--------+------------------+------------------+ ICA Prox  75      15      1-39%   focal and calcificShadowing          +----------+--------+--------+--------+------------------+------------------+ ICA Mid   67      18                                                   +----------+--------+--------+--------+------------------+------------------+ ICA Distal56      17                                tortuous           +----------+--------+--------+--------+------------------+------------------+ ECA       101     15              focal and calcificshadowing          +----------+--------+--------+--------+------------------+------------------+ +----------+--------+--------+----------------+-------------------+           PSV cm/sEDV cm/sDescribe        Arm Pressure (mmHG) +----------+--------+--------+----------------+-------------------+ [id number]             Multiphasic, [id number]                 +----------+--------+--------+----------------+-------------------+ +---------+--------+--+--------+--+---------+ VertebralPSV cm/s33EDV cm/s10Antegrade +---------+--------+--+--------+--+---------+   Summary: Right Carotid: Velocities in the right ICA are consistent with  a 1-39% stenosis. Left Carotid: Velocities in the left ICA are consistent with a 1-39% stenosis. Vertebrals:  Bilateral vertebral arteries demonstrate antegrade flow. Subclavians: Normal flow hemodynamics were seen in bilateral subclavian              arteries. *See table(s) above for measurements and observations.  Suggest follow up study in 12 months. Electronically signed by Janelle Mediate MD on 03/20/2024 at 12:08:49 PM.    Final    CUP PACEART REMOTE DEVICE CHECK Result Date: 02/25/2024 PPM Scheduled remote reviewed. Normal device function.  Presenting rhythm:  AP/VS Next remote 91 days. LA, CVRS     ASSESSMENT/PLAN:  Giannah  B Finnigan is a 89 y.o. female who presents to the clinic for evaluation for IgG lambda multiple myeloma.   # IgG Lambda Multiple Myeloma  --diagnosis of MM confirmed with bone marrow biopsy showing 60% plasma cells and anemia -- Bone survey shows no lytic lesions, kidney function is within normal limits.  --08/01/2022 was Cycle 1 Day 1 of VRd  -Dr. Rosaline Coma discontinued velcade  on 02/05/23 due to rash. She is status post day 15 cycle #9.  -Dr. Rosaline Coma changed the care plan to Daratumumab , Revlimid , and decadron . She is scheduled for day 1 cycle #1 today  -Started Cycle 1, Day 1 of Dara/Rev/Dex on 02/23/2023.  Plan:  --Labs from today show white blood cell 4.7, hemoglobin 13.3, MCV 87.8, platelets 96. LFTs normal.  Multiple myeloma panel pending today. --Okay to hold Revlimid  as patient is recovering from recent hospitalization and settling into residing at [facility].  --RTC in 4 weeks for labs and clinic visit and to reevaluate how she is doing on Revlimid .  #Hypotension/BP/Atrial Fibrillation  --Follows with cardiology, Dr. Berry Bristol --Was recently admitted from 01/08/2024 through 01/14/2024 after having syncopal episode and found to have complete heart block.  She underwent pacemaker placement. --Patient's blood pressure is 110/80 today which is below her baseline. --Advised to check the blood pressure in the morning before taking her antihypertensives and follow-up with Dr. Berry Bristol if her blood pressure is low or she is symptomatic. --Continue on Eliquis  5 mg BID   # Normocytic Anemia #Vitamin B12 Deficiency --Anemia likely driven by multiple myeloma, but may be component of  vitamin B12 deficiency as well. --initial labs showed elevated MMA with B12 180 -- Continue vitamin B12 1000 mcg p.o. daily -- Continue to monitor   # H/O Left lower extremity neuropathy: --Currently on gabapentin  900 mg nightly and 600 mg in the morning --Encouraged to try water  aerobics and stretches.  --Following with Dr. Colonel Dears (ortho) to see if she is a candidate for steroid injections.  -- Patient evaluated by Dr. Mark Sil thinks that this may be neuropathy worsened by her Velcade  therapy.   #Episode of right UE tremor and speech abnormality: --Evaluated by Dr. Mark Sil on 03/20/2023. Presumed etiology had been atrial fibrillation, but vascular disease, atheromatous changes, are also possibly implicated concurrently.  --MRI brain from 03/13/2023 show no acute finding including no evidence of intracranial neoplastic disease. There are chronic small-vessel ischemic changes of the pons, cerebral hemispheric white matter and right cerebellum. --Educated patient to seek immediate evaluate if there are repeat episodes. --No neurological deficits identified per exam today  #Supportive Care -- chemotherapy education complete.  -- port placement not required.  -- zofran  8mg  q8H PRN and compazine  10mg  PO q6H for nausea -- acyclovir  400mg  PO BID for VCZ prophylaxis -- tylenol  1000 mg q8H PRN for back pain.   No orders of the  defined types were placed in this encounter.   Patient expressed understanding of the plan provided.   I have spent a total of 30 minutes minutes of face-to-face and non-face-to-face time, preparing to see the patient, performing a medically appropriate examination, counseling and educating the patient, ordering tests/procedures, documenting clinical information in the electronic health record, and care coordination.   Rogerio Clay, MD Department of Hematology/Oncology [facility] Phone: [phone] Pager: [phone] Email:  [email or web address]

## 2024-03-24 ENCOUNTER — Inpatient Hospital Stay: Attending: Physician Assistant | Admitting: Hematology and Oncology

## 2024-03-24 ENCOUNTER — Inpatient Hospital Stay

## 2024-03-24 VITALS — BP 136/89 | HR 68 | Temp 97.8°F | Resp 13 | Wt 163.6 lb

## 2024-03-24 DIAGNOSIS — I10 Essential (primary) hypertension: Secondary | ICD-10-CM | POA: Diagnosis not present

## 2024-03-24 DIAGNOSIS — C9000 Multiple myeloma not having achieved remission: Secondary | ICD-10-CM

## 2024-03-24 DIAGNOSIS — I4891 Unspecified atrial fibrillation: Secondary | ICD-10-CM | POA: Diagnosis not present

## 2024-03-24 DIAGNOSIS — D649 Anemia, unspecified: Secondary | ICD-10-CM | POA: Diagnosis not present

## 2024-03-24 DIAGNOSIS — E538 Deficiency of other specified B group vitamins: Secondary | ICD-10-CM | POA: Diagnosis not present

## 2024-03-24 DIAGNOSIS — Z7901 Long term (current) use of anticoagulants: Secondary | ICD-10-CM | POA: Insufficient documentation

## 2024-03-24 LAB — CMP (CANCER CENTER ONLY)
ALT: 9 U/L (ref 0–44)
AST: 13 U/L — ABNORMAL LOW (ref 15–41)
Albumin: 3.9 g/dL (ref 3.5–5.0)
Alkaline Phosphatase: 115 U/L (ref 38–126)
Anion gap: 7 (ref 5–15)
BUN: 29 mg/dL — ABNORMAL HIGH (ref 8–23)
CO2: 28 mmol/L (ref 22–32)
Calcium: 9.3 mg/dL (ref 8.9–10.3)
Chloride: 105 mmol/L (ref 98–111)
Creatinine: 1.17 mg/dL — ABNORMAL HIGH (ref 0.44–1.00)
GFR, Estimated: 45 mL/min — ABNORMAL LOW (ref >60)
Glucose, Bld: 96 mg/dL (ref 70–99)
Potassium: 3.8 mmol/L (ref 3.5–5.1)
Sodium: 140 mmol/L (ref 135–145)
Total Bilirubin: 0.7 mg/dL (ref 0.0–1.2)
Total Protein: 6.5 g/dL (ref 6.5–8.1)

## 2024-03-24 LAB — CBC WITH DIFFERENTIAL (CANCER CENTER ONLY)
Abs Immature Granulocytes: 0 10*3/uL (ref 0.00–0.07)
Basophils Absolute: 0 10*3/uL (ref 0.0–0.1)
Basophils Relative: 0 %
Eosinophils Absolute: 0.1 10*3/uL (ref 0.0–0.5)
Eosinophils Relative: 2 %
HCT: 40.4 % (ref 36.0–46.0)
Hemoglobin: 13.3 g/dL (ref 12.0–15.0)
Immature Granulocytes: 0 %
Lymphocytes Relative: 17 %
Lymphs Abs: 0.8 10*3/uL (ref 0.7–4.0)
MCH: 28.9 pg (ref 26.0–34.0)
MCHC: 32.9 g/dL (ref 30.0–36.0)
MCV: 87.8 fL (ref 80.0–100.0)
Monocytes Absolute: 0.9 10*3/uL (ref 0.1–1.0)
Monocytes Relative: 18 %
Neutro Abs: 3 10*3/uL (ref 1.7–7.7)
Neutrophils Relative %: 63 %
Platelet Count: 96 10*3/uL — ABNORMAL LOW (ref 150–400)
RBC: 4.6 MIL/uL (ref 3.87–5.11)
RDW: 17.4 % — ABNORMAL HIGH (ref 11.5–15.5)
WBC Count: 4.7 10*3/uL (ref 4.0–10.5)
nRBC: 0 % (ref 0.0–0.2)

## 2024-03-24 LAB — LACTATE DEHYDROGENASE: LDH: 234 U/L — ABNORMAL HIGH (ref 98–192)

## 2024-03-25 LAB — KAPPA/LAMBDA LIGHT CHAINS
Kappa free light chain: 23.3 mg/L — ABNORMAL HIGH (ref 3.3–19.4)
Kappa, lambda light chain ratio: 1.49 (ref 0.26–1.65)
Lambda free light chains: 15.6 mg/L (ref 5.7–26.3)

## 2024-03-27 LAB — MULTIPLE MYELOMA PANEL, SERUM
Albumin SerPl Elph-Mcnc: 3.4 g/dL (ref 2.9–4.4)
Albumin/Glob SerPl: 1.4 (ref 0.7–1.7)
Alpha 1: 0.3 g/dL (ref 0.0–0.4)
Alpha2 Glob SerPl Elph-Mcnc: 0.7 g/dL (ref 0.4–1.0)
B-Globulin SerPl Elph-Mcnc: 1 g/dL (ref 0.7–1.3)
Gamma Glob SerPl Elph-Mcnc: 0.7 g/dL (ref 0.4–1.8)
Globulin, Total: 2.6 g/dL (ref 2.2–3.9)
IgA: 194 mg/dL (ref 64–422)
IgG (Immunoglobin G), Serum: 779 mg/dL (ref 586–1602)
IgM (Immunoglobulin M), Srm: 62 mg/dL (ref 26–217)
Total Protein ELP: 6 g/dL (ref 6.0–8.5)

## 2024-03-30 ENCOUNTER — Emergency Department (HOSPITAL_BASED_OUTPATIENT_CLINIC_OR_DEPARTMENT_OTHER)

## 2024-03-30 ENCOUNTER — Other Ambulatory Visit: Payer: Self-pay

## 2024-03-30 ENCOUNTER — Encounter (HOSPITAL_BASED_OUTPATIENT_CLINIC_OR_DEPARTMENT_OTHER): Payer: Self-pay

## 2024-03-30 ENCOUNTER — Observation Stay (HOSPITAL_BASED_OUTPATIENT_CLINIC_OR_DEPARTMENT_OTHER)
Admission: EM | Admit: 2024-03-30 | Discharge: 2024-04-01 | Disposition: A | Discharge status: Home Health | Attending: Internal Medicine | Admitting: Internal Medicine

## 2024-03-30 DIAGNOSIS — R2990 Unspecified symptoms and signs involving the nervous system: Secondary | ICD-10-CM | POA: Diagnosis present

## 2024-03-30 DIAGNOSIS — M4850XA Collapsed vertebra, not elsewhere classified, site unspecified, initial encounter for fracture: Secondary | ICD-10-CM

## 2024-03-30 DIAGNOSIS — Z952 Presence of prosthetic heart valve: Secondary | ICD-10-CM | POA: Diagnosis not present

## 2024-03-30 DIAGNOSIS — Z950 Presence of cardiac pacemaker: Secondary | ICD-10-CM | POA: Diagnosis not present

## 2024-03-30 DIAGNOSIS — I130 Hypertensive heart and chronic kidney disease with heart failure and stage 1 through stage 4 chronic kidney disease, or unspecified chronic kidney disease: Secondary | ICD-10-CM | POA: Diagnosis not present

## 2024-03-30 DIAGNOSIS — R4701 Aphasia: Secondary | ICD-10-CM | POA: Diagnosis not present

## 2024-03-30 DIAGNOSIS — I708 Atherosclerosis of other arteries: Secondary | ICD-10-CM | POA: Diagnosis not present

## 2024-03-30 DIAGNOSIS — R102 Pelvic and perineal pain: Secondary | ICD-10-CM | POA: Insufficient documentation

## 2024-03-30 DIAGNOSIS — M545 Low back pain, unspecified: Secondary | ICD-10-CM | POA: Insufficient documentation

## 2024-03-30 DIAGNOSIS — Z8673 Personal history of transient ischemic attack (TIA), and cerebral infarction without residual deficits: Secondary | ICD-10-CM

## 2024-03-30 DIAGNOSIS — E785 Hyperlipidemia, unspecified: Secondary | ICD-10-CM | POA: Insufficient documentation

## 2024-03-30 DIAGNOSIS — I5032 Chronic diastolic (congestive) heart failure: Secondary | ICD-10-CM | POA: Diagnosis present

## 2024-03-30 DIAGNOSIS — C9000 Multiple myeloma not having achieved remission: Secondary | ICD-10-CM | POA: Diagnosis present

## 2024-03-30 DIAGNOSIS — Z79899 Other long term (current) drug therapy: Secondary | ICD-10-CM | POA: Diagnosis not present

## 2024-03-30 DIAGNOSIS — Z853 Personal history of malignant neoplasm of breast: Secondary | ICD-10-CM | POA: Insufficient documentation

## 2024-03-30 DIAGNOSIS — I6782 Cerebral ischemia: Secondary | ICD-10-CM | POA: Diagnosis not present

## 2024-03-30 DIAGNOSIS — R471 Dysarthria and anarthria: Secondary | ICD-10-CM | POA: Diagnosis not present

## 2024-03-30 DIAGNOSIS — N183 Chronic kidney disease, stage 3 unspecified: Secondary | ICD-10-CM | POA: Diagnosis not present

## 2024-03-30 DIAGNOSIS — I10 Essential (primary) hypertension: Secondary | ICD-10-CM | POA: Diagnosis not present

## 2024-03-30 DIAGNOSIS — G459 Transient cerebral ischemic attack, unspecified: Secondary | ICD-10-CM | POA: Insufficient documentation

## 2024-03-30 DIAGNOSIS — E039 Hypothyroidism, unspecified: Secondary | ICD-10-CM | POA: Diagnosis not present

## 2024-03-30 DIAGNOSIS — D696 Thrombocytopenia, unspecified: Secondary | ICD-10-CM | POA: Insufficient documentation

## 2024-03-30 DIAGNOSIS — I480 Paroxysmal atrial fibrillation: Secondary | ICD-10-CM | POA: Diagnosis not present

## 2024-03-30 DIAGNOSIS — Z7901 Long term (current) use of anticoagulants: Secondary | ICD-10-CM | POA: Diagnosis not present

## 2024-03-30 DIAGNOSIS — W19XXXA Unspecified fall, initial encounter: Secondary | ICD-10-CM | POA: Diagnosis not present

## 2024-03-30 DIAGNOSIS — R29810 Facial weakness: Principal | ICD-10-CM | POA: Insufficient documentation

## 2024-03-30 DIAGNOSIS — I4892 Unspecified atrial flutter: Secondary | ICD-10-CM | POA: Diagnosis present

## 2024-03-30 DIAGNOSIS — I5030 Unspecified diastolic (congestive) heart failure: Secondary | ICD-10-CM | POA: Insufficient documentation

## 2024-03-30 DIAGNOSIS — G620 Drug-induced polyneuropathy: Secondary | ICD-10-CM | POA: Diagnosis present

## 2024-03-30 HISTORY — DX: Unspecified atrial fibrillation: I48.91

## 2024-03-30 LAB — CBC WITH DIFFERENTIAL/PLATELET
Abs Immature Granulocytes: 0.02 10*3/uL (ref 0.00–0.07)
Basophils Absolute: 0 10*3/uL (ref 0.0–0.1)
Basophils Relative: 0 %
Eosinophils Absolute: 0.1 10*3/uL (ref 0.0–0.5)
Eosinophils Relative: 2 %
HCT: 38.2 % (ref 36.0–46.0)
Hemoglobin: 12.5 g/dL (ref 12.0–15.0)
Immature Granulocytes: 0 %
Lymphocytes Relative: 17 %
Lymphs Abs: 0.9 10*3/uL (ref 0.7–4.0)
MCH: 29.6 pg (ref 26.0–34.0)
MCHC: 32.7 g/dL (ref 30.0–36.0)
MCV: 90.3 fL (ref 80.0–100.0)
Monocytes Absolute: 0.8 10*3/uL (ref 0.1–1.0)
Monocytes Relative: 15 %
Neutro Abs: 3.6 10*3/uL (ref 1.7–7.7)
Neutrophils Relative %: 66 %
Platelets: 79 10*3/uL — ABNORMAL LOW (ref 150–400)
RBC: 4.23 MIL/uL (ref 3.87–5.11)
RDW: 17.2 % — ABNORMAL HIGH (ref 11.5–15.5)
WBC: 5.5 10*3/uL (ref 4.0–10.5)
nRBC: 0 % (ref 0.0–0.2)

## 2024-03-30 LAB — COMPREHENSIVE METABOLIC PANEL WITH GFR
ALT: 8 U/L (ref 0–44)
AST: 15 U/L (ref 15–41)
Albumin: 3.9 g/dL (ref 3.5–5.0)
Alkaline Phosphatase: 114 U/L (ref 38–126)
Anion gap: 14 (ref 5–15)
BUN: 31 mg/dL — ABNORMAL HIGH (ref 8–23)
CO2: 24 mmol/L (ref 22–32)
Calcium: 8.9 mg/dL (ref 8.9–10.3)
Chloride: 100 mmol/L (ref 98–111)
Creatinine, Ser: 1.1 mg/dL — ABNORMAL HIGH (ref 0.44–1.00)
GFR, Estimated: 48 mL/min — ABNORMAL LOW (ref >60)
Glucose, Bld: 94 mg/dL (ref 70–99)
Potassium: 3.9 mmol/L (ref 3.5–5.1)
Sodium: 138 mmol/L (ref 135–145)
Total Bilirubin: 0.4 mg/dL (ref 0.0–1.2)
Total Protein: 6.1 g/dL — ABNORMAL LOW (ref 6.5–8.1)

## 2024-03-30 LAB — CBG MONITORING, ED: Glucose-Capillary: 94 mg/dL (ref 70–99)

## 2024-03-30 MED ORDER — ACETAMINOPHEN 500 MG PO TABS
1000.0000 mg | ORAL_TABLET | Freq: Once | ORAL | Status: AC
  Administered 2024-03-30: 1000 mg via ORAL
  Filled 2024-03-30: qty 2

## 2024-03-30 MED ORDER — IOHEXOL 350 MG/ML SOLN
75.0000 mL | Freq: Once | INTRAVENOUS | Status: AC | PRN
  Administered 2024-03-30: 75 mL via INTRAVENOUS

## 2024-03-30 NOTE — ED Notes (Signed)
 22G in R hand accidentally removed by patient during trip to restroom. Bleeding occurred, pt cleaned up, pressure dressing applied to site, bleeding controlled. RN notified.

## 2024-03-30 NOTE — Plan of Care (Signed)
 I am asked to comment on management for this patient who presented for a brief episode of difficulty speaking  Per history provided by ED physician "Patient here after strokelike symptoms.  She had 20 minutes of difficulty getting words out, left-sided facial weakness at approximately 12:30 PM.  Resolved on the way here.  She has a history of A-fib on Eliquis .  She denies any weakness numbness tingling speech issues now.  She has a headache.  Prior to coming here she had 20 minutes of difficulty getting out words facial droop.  Sounds like history of TIA, history of high cholesterol breast cancer hypertension.  Denies any fever or chills.  She is feeling otherwise okay now."  Agree with CTA head and neck to confirm there is no critical stenosis that would need to be addressed.  If this is negative, MRI brain to confirm no significant stroke and safety of continuing Eliquis  -- unfortunately due to her pacemaker this would not be able to be completed until tomorrow so will need admission.  If MRI cannot be obtained, would repeat head CT 12 to 24 hours after symptom onset  From a primarily neurological perspective since she is already indicated for anticoagulation and echocardiogram would not be expected to change management but defer to ED/primary team if this is felt to be needed from a medical perspective.  Certainly while she was in the hospital A1c and lipid panel can also be assessed to make sure the patient is meeting goal LDL less than 70 and A1c less than 7%  Baldwin Levee MD-PhD [facility] [phone] [facility] coverage for [facility] is from 8 AM to 4 AM in-house and 4 PM to 8 PM by telephone/video. 8 PM to 8 AM emergent questions or overnight urgent questions should be addressed to Teleneurology On-call or Arlin Benes neurohospitalist; contact information can be found on AMION  These are curbside recommendations based upon the information readily available in the chart on  brief review as well as history and examination information provided to me by requesting provider and do not replace a full detailed consult.

## 2024-03-30 NOTE — ED Provider Notes (Signed)
 CTA does not show any dissection or LVO.  Discussed with Dr. Butch Cashing who has accepted the patient for admission to [facility].   Hershel Los, MD 03/30/24 [phone]

## 2024-03-30 NOTE — ED Triage Notes (Signed)
 In for eval sudden onset slurred speech and facial droop 20 minutes pta. Resolved upon arrival to ED. C/O headache. Takes eliquis .

## 2024-03-30 NOTE — ED Provider Notes (Addendum)
 [place] EMERGENCY DEPARTMENT AT [facility] Provider Note   CSN: [id number] Arrival date & time: 03/30/24  1314     History  Chief Complaint  Patient presents with   Aphasia    Zoe  B [name] is a 89 y.o. female.  Patient here after strokelike symptoms.  Zoe [name] had 20 minutes of difficulty getting words out, left-sided facial weakness.  Resolved on the way here.  Zoe [name] has a history of A-fib on Eliquis .  Zoe [name] denies any weakness numbness tingling speech issues now.  Zoe [name] has a headache.  Prior to coming here Zoe [name] had 20 minutes of difficulty getting out words facial droop.  Sounds like history of TIA, history of high cholesterol breast cancer hypertension.  Denies any fever or chills.  Zoe [name] is feeling otherwise okay now.  The history is provided by the patient.       Home Medications Prior to Admission medications   Medication Sig Start Date End Date Taking? Authorizing Provider  acetaminophen  (TYLENOL ) 500 MG tablet Take 500 mg by mouth every morning.    [provider]  acyclovir  (ZOVIRAX ) 400 MG tablet Take 1 tablet (400 mg total) by mouth 2 (two) times daily. 12/17/23   Dorsey, John T IV, MD  ALPRAZOLAM PO Take 0.25 mg by mouth daily as needed. Patient not taking: Reported on 02/28/2024    [provider]  amiodarone  (PACERONE ) 200 MG tablet Take 0.5 tablets (100 mg total) by mouth daily. 11/26/23   Knox Perl, MD  amLODipine  (NORVASC ) 5 MG tablet Take 5 mg by mouth daily.    [provider]  apixaban  (ELIQUIS ) 5 MG TABS tablet Take 1 tablet (5 mg total) by mouth 2 (two) times daily. 11/15/23   Knox Perl, MD  Artificial Tear Solution (SOOTHE XP) SOLN Place 1 drop into both eyes every evening.    [provider]  Cholecalciferol  (VITAMIN D3) 50 MCG (2000 UT) capsule Take 1 capsule (2,000 Units total) by mouth daily. Patient taking differently: Take 1,000 Units by mouth daily. 09/11/22   Ander Bame, MD  [employer] VITAMIN B12 1000 MCG  tablet TAKE 1 TABLET BY MOUTH EVERY DAY 02/15/23   Thayil, Irene T, PA-C  docusate sodium  (COLACE) 100 MG capsule Take 100 mg by mouth daily as needed for mild constipation.    [provider]  furosemide  (LASIX ) 20 MG tablet Take 1 tablet (20 mg total) by mouth as needed for edema. 10/23/23 01/21/24  Lamond Pilot, PA  lenalidomide  (REVLIMID ) 10 MG capsule Take 1 capsule (10 mg total) by mouth daily. Auth # [id number]  Date Obtained 03/10/24 Take 1 capsule daily for 21 days then none for 7 days 03/10/24   Ander Bame, MD  levothyroxine  (SYNTHROID , LEVOTHROID) 100 MCG tablet Take 100 mcg by mouth daily before breakfast. 12/29/15   [provider]  lidocaine  (LIDODERM ) 5 % Place 1 patch onto the skin daily. Remove & Discard patch within 12 hours or as directed by MD 02/22/24   Denese Finn, PA-C  losartan  (COZAAR ) 50 MG tablet Take 1 tablet (50 mg total) by mouth daily. 01/14/24   Lesa Rape, MD  methylPREDNISolone  (MEDROL  DOSEPAK) 4 MG TBPK tablet Follow package insert 02/23/24   Adain Geurin, DO  metoprolol  succinate (TOPROL -XL) 50 MG 24 hr tablet Take 1 tablet (50 mg total) by mouth daily. Take with or immediately following a meal. 01/14/24   Lesa Rape, MD  Multiple Vitamins-Minerals (PRESERVISION AREDS) CAPS Take 1 capsule by  mouth 2 (two) times daily.    [provider]  oxyCODONE  (ROXICODONE ) 5 MG immediate release tablet Take 1 tablet (5 mg total) by mouth every 6 (six) hours as needed for up to 10 doses. 02/23/24   Donold Marotto, DO  pantoprazole  (PROTONIX ) 40 MG tablet Take 1 tablet (40 mg total) by mouth daily at 6 (six) AM. 10/03/23   Armenta Landau, MD  polyethylene glycol (MIRALAX  / GLYCOLAX ) 17 g packet Take 17 g by mouth daily as needed for mild constipation.    [provider]  potassium chloride  (KLOR-CON ) 10 MEQ tablet Take 1 tablet (10 mEq total) by mouth as needed (TAKE WITH LASIX  FOR SWELLING). 10/23/23 01/21/24  Lamond Pilot, PA   pravastatin  (PRAVACHOL ) 40 MG tablet Take 40 mg by mouth every evening.    [provider]  traMADol  (ULTRAM ) 50 MG tablet Take 50 mg by mouth daily.    [provider]      Allergies    Quinolones, Penicillins, and Sulfa antibiotics    Review of Systems   Review of Systems  Physical Exam Updated Vital Signs BP 129/67 (BP Location: Right Arm)   Pulse (!) 59   Temp 98.2 F (36.8 C)   Resp 18   SpO2 100%  Physical Exam Vitals and nursing note reviewed.  Constitutional:      General: Zoe [name] is not in acute distress.    Appearance: Zoe [name] is well-developed. Zoe [name] is not ill-appearing.  HENT:     Head: Normocephalic and atraumatic.     Nose: Nose normal.     Mouth/Throat:     Mouth: Mucous membranes are moist.  Eyes:     Extraocular Movements: Extraocular movements intact.     Conjunctiva/sclera: Conjunctivae normal.     Pupils: Pupils are equal, round, and reactive to light.  Cardiovascular:     Rate and Rhythm: Normal rate and regular rhythm.     Pulses: Normal pulses.     Heart sounds: Normal heart sounds. No murmur heard. Pulmonary:     Effort: Pulmonary effort is normal. No respiratory distress.     Breath sounds: Normal breath sounds.  Abdominal:     General: Abdomen is flat.     Palpations: Abdomen is soft.     Tenderness: There is no abdominal tenderness.  Musculoskeletal:        General: No swelling.     Cervical back: Normal range of motion and neck supple.  Skin:    General: Skin is warm and dry.     Capillary Refill: Capillary refill takes less than 2 seconds.  Neurological:     General: No focal deficit present.     Mental Status: Zoe [name] is alert and oriented to person, place, and time.     Cranial Nerves: No cranial nerve deficit.     Sensory: No sensory deficit.     Motor: No weakness.     Coordination: Coordination normal.     Comments: 5+ out of 5 strength throughout, normal sensation, no drift, normal finger-to-nose.,  Normal speech normal  visual fields  Psychiatric:        Mood and Affect: Mood normal.     ED Results / Procedures / Treatments   Labs (all labs ordered are listed, but only abnormal results are displayed) Labs Reviewed  COMPREHENSIVE METABOLIC PANEL WITH GFR - Abnormal; Notable for the following components:      Result Value   BUN 31 (*)    Creatinine, Ser  1.10 (*)    Total Protein 6.1 (*)    GFR, Estimated 48 (*)    All other components within normal limits  CBC WITH DIFFERENTIAL/PLATELET  CBG MONITORING, ED    EKG EKG Interpretation Date/Time:  Sunday March 30 2024 13:49:57 EDT Ventricular Rate:  60 PR Interval:  369 QRS Duration:  121 QT Interval:  496 QTC Calculation: 496 R Axis:   -44  Text Interpretation: Sinus rhythm Prolonged PR interval Confirmed by Lowery Rue [id number]) on 03/30/2024 2:03:05 PM  Radiology No results found.  Procedures Procedures    Medications Ordered in ED Medications  iohexol  (OMNIPAQUE ) 350 MG/ML injection 75 mL (has no administration in time range)  acetaminophen  (TYLENOL ) tablet 1,000 mg (1,000 mg Oral Given 03/30/24 1422)    ED Course/ Medical Decision Making/ A&P                                 Medical Decision Making Amount and/or Complexity of Data Reviewed Labs: ordered. Radiology: ordered.  Risk OTC drugs. Prescription drug management.   Marti  B Lansberry is here with strokelike symptoms.  Normal vitals.  No fever.  History of A-fib on Eliquis .  History of high cholesterol hypertension.  Zoe [name] had about 20 minutes of expressive aphasia, left-sided facial droop now resolved.  Her neurological exam is normal.  Differential diagnosis my suspicion likely TIA.  Zoe [name] is having a headache now.  No migraine history.  Headache started after her symptoms resolved.  But Zoe [name] is very well-appearing otherwise.  No chest pain no shortness of breath.  EKG shows sinus rhythm.  No ischemic changes.  There was no seizure-like activity during this episode.  Zoe [name] was  out eating with family when Zoe [name] developed inability to speak and answer questions.  Zoe [name] understood what they were saying but Zoe [name] can answer the questions.  Family noticed that the left side of her face was drooping.  Zoe [name] did not lose consciousness.    I did talk with Dr. Cleone Dad with neurology.  Zoe [name] agrees with CTA of the head and neck basic labs.   Zoe [name] does have a pacemaker however.  Zoe [name] will need admission for MRI in the morning when there is a team there to help manage pacemaker.  Pacemaker was placed a couple months ago.  Appears to be MRI safe.  Patient handed off to oncoming ED staff with patient pending lab work and CT of the head and neck.  If unremarkable anticipate admission the [facility] for further TIA workup, MRI tomorrow due to having a pacermaker.  This chart was dictated using voice recognition software.  Despite best efforts to proofread,  errors can occur which can change the documentation meaning.    Final Clinical Impression(s) / ED Diagnoses Final diagnoses:  Aphasia  Facial droop    Rx / DC Orders ED Discharge Orders     None         Lowery Rue, DO 03/30/24 1413    Lowery Rue, DO 03/30/24 1425    Daemion Mcniel, DO 03/30/24 1436    Lowery Rue, DO 03/30/24 1459

## 2024-03-31 ENCOUNTER — Observation Stay (HOSPITAL_COMMUNITY)

## 2024-03-31 ENCOUNTER — Encounter (HOSPITAL_COMMUNITY): Payer: Self-pay | Admitting: Internal Medicine

## 2024-03-31 DIAGNOSIS — I6782 Cerebral ischemia: Secondary | ICD-10-CM | POA: Diagnosis not present

## 2024-03-31 DIAGNOSIS — R471 Dysarthria and anarthria: Secondary | ICD-10-CM | POA: Diagnosis not present

## 2024-03-31 DIAGNOSIS — Z853 Personal history of malignant neoplasm of breast: Secondary | ICD-10-CM | POA: Diagnosis not present

## 2024-03-31 DIAGNOSIS — M47814 Spondylosis without myelopathy or radiculopathy, thoracic region: Secondary | ICD-10-CM | POA: Diagnosis not present

## 2024-03-31 DIAGNOSIS — R2990 Unspecified symptoms and signs involving the nervous system: Secondary | ICD-10-CM

## 2024-03-31 DIAGNOSIS — G459 Transient cerebral ischemic attack, unspecified: Secondary | ICD-10-CM | POA: Diagnosis not present

## 2024-03-31 DIAGNOSIS — M549 Dorsalgia, unspecified: Secondary | ICD-10-CM | POA: Diagnosis not present

## 2024-03-31 DIAGNOSIS — Z7901 Long term (current) use of anticoagulants: Secondary | ICD-10-CM | POA: Diagnosis not present

## 2024-03-31 DIAGNOSIS — W19XXXA Unspecified fall, initial encounter: Secondary | ICD-10-CM | POA: Diagnosis not present

## 2024-03-31 DIAGNOSIS — G319 Degenerative disease of nervous system, unspecified: Secondary | ICD-10-CM | POA: Diagnosis not present

## 2024-03-31 DIAGNOSIS — D696 Thrombocytopenia, unspecified: Secondary | ICD-10-CM | POA: Diagnosis not present

## 2024-03-31 DIAGNOSIS — J323 Chronic sphenoidal sinusitis: Secondary | ICD-10-CM | POA: Diagnosis not present

## 2024-03-31 DIAGNOSIS — I5030 Unspecified diastolic (congestive) heart failure: Secondary | ICD-10-CM | POA: Diagnosis not present

## 2024-03-31 DIAGNOSIS — M545 Low back pain, unspecified: Secondary | ICD-10-CM | POA: Diagnosis not present

## 2024-03-31 DIAGNOSIS — R102 Pelvic and perineal pain: Secondary | ICD-10-CM | POA: Diagnosis not present

## 2024-03-31 DIAGNOSIS — E785 Hyperlipidemia, unspecified: Secondary | ICD-10-CM | POA: Diagnosis not present

## 2024-03-31 DIAGNOSIS — M4805 Spinal stenosis, thoracolumbar region: Secondary | ICD-10-CM | POA: Diagnosis not present

## 2024-03-31 DIAGNOSIS — C9000 Multiple myeloma not having achieved remission: Secondary | ICD-10-CM | POA: Diagnosis not present

## 2024-03-31 DIAGNOSIS — I480 Paroxysmal atrial fibrillation: Secondary | ICD-10-CM | POA: Diagnosis not present

## 2024-03-31 DIAGNOSIS — R29810 Facial weakness: Secondary | ICD-10-CM | POA: Diagnosis not present

## 2024-03-31 DIAGNOSIS — Z79899 Other long term (current) drug therapy: Secondary | ICD-10-CM | POA: Diagnosis not present

## 2024-03-31 DIAGNOSIS — M4804 Spinal stenosis, thoracic region: Secondary | ICD-10-CM | POA: Diagnosis not present

## 2024-03-31 DIAGNOSIS — N183 Chronic kidney disease, stage 3 unspecified: Secondary | ICD-10-CM | POA: Diagnosis not present

## 2024-03-31 DIAGNOSIS — E039 Hypothyroidism, unspecified: Secondary | ICD-10-CM | POA: Diagnosis not present

## 2024-03-31 DIAGNOSIS — Z952 Presence of prosthetic heart valve: Secondary | ICD-10-CM | POA: Diagnosis not present

## 2024-03-31 DIAGNOSIS — Z950 Presence of cardiac pacemaker: Secondary | ICD-10-CM | POA: Diagnosis not present

## 2024-03-31 DIAGNOSIS — I130 Hypertensive heart and chronic kidney disease with heart failure and stage 1 through stage 4 chronic kidney disease, or unspecified chronic kidney disease: Secondary | ICD-10-CM | POA: Diagnosis not present

## 2024-03-31 LAB — HEMOGLOBIN A1C
Hgb A1c MFr Bld: 5.1 % (ref 4.8–5.6)
Mean Plasma Glucose: 99.67 mg/dL

## 2024-03-31 MED ORDER — SODIUM CHLORIDE 0.9% FLUSH
3.0000 mL | Freq: Two times a day (BID) | INTRAVENOUS | Status: DC
  Administered 2024-03-31 (×2): 3 mL via INTRAVENOUS

## 2024-03-31 MED ORDER — PRAVASTATIN SODIUM 40 MG PO TABS
40.0000 mg | ORAL_TABLET | Freq: Every evening | ORAL | Status: DC
  Administered 2024-03-31: 40 mg via ORAL
  Filled 2024-03-31: qty 1

## 2024-03-31 MED ORDER — STROKE: EARLY STAGES OF RECOVERY BOOK
Freq: Once | Status: AC
  Filled 2024-03-31: qty 1

## 2024-03-31 MED ORDER — AMIODARONE HCL 200 MG PO TABS
100.0000 mg | ORAL_TABLET | Freq: Every day | ORAL | Status: DC
  Administered 2024-03-31 – 2024-04-01 (×2): 100 mg via ORAL
  Filled 2024-03-31 (×2): qty 1

## 2024-03-31 MED ORDER — SODIUM CHLORIDE 0.9% FLUSH: 3.0000 mL | INTRAVENOUS | Status: DC | PRN

## 2024-03-31 MED ORDER — APIXABAN 2.5 MG PO TABS
5.0000 mg | ORAL_TABLET | Freq: Two times a day (BID) | ORAL | Status: DC
  Administered 2024-03-31 – 2024-04-01 (×2): 5 mg via ORAL
  Filled 2024-03-31 (×2): qty 2

## 2024-03-31 MED ORDER — ACETAMINOPHEN 325 MG PO TABS
650.0000 mg | ORAL_TABLET | ORAL | Status: DC | PRN
Start: 2024-03-31 — End: 2024-04-01

## 2024-03-31 MED ORDER — ACETAMINOPHEN 650 MG RE SUPP: 650.0000 mg | RECTAL | Status: DC | PRN

## 2024-03-31 MED ORDER — ACETAMINOPHEN 160 MG/5ML PO SOLN: 650.0000 mg | ORAL | Status: DC | PRN

## 2024-03-31 NOTE — Consult Note (Signed)
 NEUROLOGY CONSULT NOTE   Date of service: March 31, 2024 Patient Name: Zoe [name] MRN:  [id number] DOB:  19-Aug-1934 Chief Complaint: "Slurred speech" Requesting Provider: Aisha Hove, MD  History of Present Illness  Cornella  B Bordelon is a 89 y.o. female with hx of afib on eliquis , TAVR, PAF/complete heart block status post permanent pacemaker, hypertension with underlying HFpEF, dyslipidemia, hypothyroidism, chronic kidney disease stage III presenting with "20 min" episode of difficulty getting words out, left-sided facial weakness. Upon further questioning she states her symptoms lasted approximately 5 minutes. She did have a mechanical fall one week ago and has been having bilateral hip pain since. She does additionally have issues with her balance.    LKW: 6/8 Modified rankin score: 0-Completely asymptomatic and back to baseline post- stroke  NIHSS components Score: Comment  1a Level of Conscious 0[x]  1[]  2[]  3[]      1b LOC Questions 0[x]  1[]  2[]       1c LOC Commands 0[x]  1[]  2[]       2 Best Gaze 0[x]  1[]  2[]       3 Visual 0[x]  1[]  2[]  3[]      4 Facial Palsy 0[x]  1[]  2[]  3[]      5a Motor Arm - left 0[x]  1[]  2[]  3[]  4[]  UN[]    5b Motor Arm - Right 0[x]  1[]  2[]  3[]  4[]  UN[]    6a Motor Leg - Left 0[x]  1[]  2[]  3[]  4[]  UN[]    6b Motor Leg - Right 0[x]  1[]  2[]  3[]  4[]  UN[]    7 Limb Ataxia 0[x]  1[]  2[]  UN[]      8 Sensory 0[x]  1[]  2[]  UN[]      9 Best Language 0[x]  1[]  2[]  3[]      10 Dysarthria 0[x]  1[]  2[]  UN[]      11 Extinct. and Inattention 0[x]  1[]  2[]       TOTAL: 0       ROS  Comprehensive ROS performed and pertinent positives documented in HPI   Past History   Past Medical History:  Diagnosis Date   Arthritis    some - per patient   Atrial fibrillation (HCC)    Breast cancer (HCC)    breast cancer / left    Cataract    bilat    GERD (gastroesophageal reflux disease)    History of kidney stones    Hyperlipidemia    Hypertension    Hypothyroidism     Macular degeneration    Left   S/P TAVR (transcatheter aortic valve replacement) 09/03/2018   23 mm Edwards Sapien 3 transcatheter heart valve placed via percutaneous right transfemoral approach    Severe aortic stenosis    Stress incontinence    Thyroid  disease    Tinnitus     Past Surgical History:  Procedure Laterality Date   ABDOMINAL HYSTERECTOMY  1970's   BACK SURGERY     BREAST LUMPECTOMY  12/1998   lumpectomy   CARDIAC CATHETERIZATION     CARDIOVERSION N/A 04/18/2023   Procedure: CARDIOVERSION;  Surgeon: Olinda Bertrand, DO;  Location: [facility] INVASIVE CV LAB;  Service: Cardiovascular;  Laterality: N/A;   CARDIOVERSION N/A 04/20/2023   Procedure: CARDIOVERSION;  Surgeon: Knox Perl, MD;  Location: [facility] INVASIVE CV LAB;  Service: Cardiovascular;  Laterality: N/A;   EYE SURGERY     cataract surgery bilat    INTRAOPERATIVE TRANSTHORACIC ECHOCARDIOGRAM N/A 09/03/2018   Procedure: INTRAOPERATIVE TRANSTHORACIC ECHOCARDIOGRAM;  Surgeon: Odie Benne, MD;  Location: [facility] OR;  Service: Open Heart Surgery;  Laterality: N/A;   KYPHOPLASTY N/A 09/07/2022  Procedure: THORACIC EIGHT KYPHOPLASTY;  Surgeon: Mort Ards, MD;  Location: [facility] OR;  Service: Orthopedics;  Laterality: N/A;  1 hr Local with [facility] 3 C-Bed   LITHOTRIPSY     PACEMAKER IMPLANT N/A 01/09/2024   Procedure: PACEMAKER IMPLANT;  Surgeon: Tammie Fall, MD;  Location: [facility] INVASIVE CV LAB;  Service: Cardiovascular;  Laterality: N/A;   Right total knee     2018 Dr. Bernard Brick   RIGHT/LEFT HEART CATH AND CORONARY ANGIOGRAPHY N/A 08/06/2018   Procedure: RIGHT/LEFT HEART CATH AND CORONARY ANGIOGRAPHY;  Surgeon: Knox Perl, MD;  Location: [facility] INVASIVE CV LAB;  Service: Cardiovascular;  Laterality: N/A;   THYROIDECTOMY, PARTIAL  1975   TONSILLECTOMY     as a child - patient not sure of exact date   TOTAL KNEE ARTHROPLASTY Left 03/13/2016   Procedure: TOTAL KNEE ARTHROPLASTY;  Surgeon: Claiborne Crew, MD;  Location: [facility] ORS;   Service: Orthopedics;  Laterality: Left;   TOTAL KNEE ARTHROPLASTY Right 06/18/2017   Procedure: RIGHT TOTAL KNEE ARTHROPLASTY;  Surgeon: Claiborne Crew, MD;  Location: [facility] ORS;  Service: Orthopedics;  Laterality: Right;   TRANSCATHETER AORTIC VALVE REPLACEMENT, TRANSFEMORAL N/A 09/03/2018   Procedure: TRANSCATHETER AORTIC VALVE REPLACEMENT, TRANSFEMORAL;  Surgeon: Odie Benne, MD;  Location: [facility] OR;  Service: Open Heart Surgery;  Laterality: N/A;    Family History: Family History  Problem Relation Age of Onset   Diabetes Mother    Stroke Mother        Carotid artery disease   Heart disease Father        CAD   Coronary artery disease Father    Diabetes Sister     Social History  reports that she has never smoked. She has never used smokeless tobacco. She reports that she does not drink alcohol  and does not use drugs.  Allergies  Allergen Reactions   Quinolones Other (See Comments)    Patient on Amiodarone  and can prolong QT   Penicillins Other (See Comments)    UNSPECIFIED REACTION  Patient does not remember reaction.  Has patient had a PCN reaction causing immediate rash, facial/tongue/throat swelling, SOB or lightheadedness with hypotension: no Has patient had a PCN reaction causing severe rash involving mucus membranes or skin necrosis: no Has patient had a PCN reaction that required hospitalization no Has patient had a PCN reaction occurring within the last 10 years: no If all of the above answers are "NO", then may proceed with Cephalosporin use.    Sulfa Antibiotics Other (See Comments)    UNSPECIFIED REACTION  "maybe vision issues? "    Medications  No current facility-administered medications for this encounter.  Vitals   Vitals:   03/31/24 0552 03/31/24 0915 03/31/24 1129 03/31/24 1130  BP:  (!) 166/75 (!) 144/70 (!) 144/70  Pulse:  71 68 68  Resp:  16  14  Temp: 98.3 F (36.8 C) 98.5 F (36.9 C) 97.7 F (36.5 C) 97.7 F (36.5 C)  TempSrc: Oral  Oral  Oral  SpO2:  98% 98% 98%    There is no height or weight on file to calculate BMI.   Physical Exam   Constitutional: Appears well-developed and well-nourished.  Psych: Affect appropriate to situation.  Eyes: No scleral injection.  HENT: No OP obstruction.  Head: Normocephalic.  Cardiovascular: Normal rate and regular rhythm.  Respiratory: Effort normal, non-labored breathing.  GI: Soft.  No distension. There is no tenderness.  Skin: WDI.   Neurologic Examination   Mental Status: Patient is  awake, alert, oriented to person, place, month, year, and situation. Patient is able to give a clear and coherent history. No signs of aphasia or neglect Cranial Nerves: II: Visual Fields are full. Pupils are equal, round, and reactive to light.   III,IV, VI: EOMI without ptosis or diplopia.  V: Facial sensation is symmetric to temperature VII: Facial movement is symmetric resting and smiling VIII: Hearing is intact to voice X: Palate elevates symmetrically XI: Shoulder shrug is symmetric. XII: Tongue protrudes midline without atrophy or fasciculations.  Motor: Tone is normal. Bulk is normal. 5/5 strength was present in all four extremities.  Sensory: Sensation is symmetric to light touch and temperature in the arms and legs.   Cerebellar: FNF and HKS are intact bilaterally  Labs/Imaging/Neurodiagnostic studies   CBC:  Recent Labs  Lab 04-24-24 1415  WBC 5.5  NEUTROABS 3.6  HGB 12.5  HCT 38.2  MCV 90.3  PLT 79*   Basic Metabolic Panel:  Lab Results  Component Value Date   NA 138 2024-04-24   K 3.9 2024-04-24   CO2 24 04/24/24   GLUCOSE 94 24-Apr-2024   BUN 31 (H) 24-Apr-2024   CREATININE 1.10 (H) 04/24/2024   CALCIUM  8.9 04-24-24   GFRNONAA 48 (L) 24-Apr-2024   GFRAA 78 07/16/2019   Lipid Panel:  Lab Results  Component Value Date   LDLCALC 100 (H) 03/30/2023   HgbA1c:  Lab Results  Component Value Date   HGBA1C 5.2 03/30/2023   Urine Drug Screen: No  results found for: "LABOPIA", "COCAINSCRNUR", "LABBENZ", "AMPHETMU", "THCU", "LABBARB"  Alcohol  Level     Component Value Date/Time   ETH <10 01/08/2024 1354   INR  Lab Results  Component Value Date   INR 1.3 (H) 01/08/2024   APTT  Lab Results  Component Value Date   APTT 64 (H) 01/09/2024    CT angio Head and Neck with contrast: No large vessel occlusion. No CT evidence of acute intracranial abnormality. No severe stenosis, aneurysm, or dissection of the arteries in the head and neck. Moderate stenosis of the left supraclinoid ICA. Moderate stenosis at the origin of the left subclavian artery. Findings suggestive of fibromuscular dysplasia within the mid and distal right cervical ICA and mid left cervical ICA. 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 Chronic microvascular ischemic changes. Paranasal sinus disease as above with layering secretions in the left sphenoid sinus. Recommend clinical correlation for acute sinusitis. Irregularity of the T3 superior endplate with mild height loss anteriorly. Recommend correlation with history of trauma and tenderness at this level. Consider MRI of the thoracic spine for further evaluation.  MRI Brain: Pending   Echocardiogram: EF 60-65%, LA severely dilated  Carotid Duplex Right Carotid: Velocities in the right ICA are consistent with a 1-39% stenosis.  Left Carotid: Velocities in the left ICA are consistent with a 1-39%  stenosis.  Vertebrals: Bilateral vertebral arteries demonstrate antegrade flow.  Subclavians: Normal flow hemodynamics were seen in bilateral subclavian arteries.   ASSESSMENT   Mahati  B Rudder is a 89 y.o. female presenting for a TIA work up. She is currently on Eliquis , awaiting MRI. Stroke/TIA work up underway.   RECOMMENDATIONS  - HgbA1c, fasting lipid panel - MRI of the brain without contrast - PPM compatible per cardiology team - Frequent neuro checks - Prophylactic therapy-continue Eliquis  after MRI if  medically appropriate - Risk factor modification - Telemetry monitoring - PT consult, OT consult, Speech consult - Stroke team to follow  ______________________________________________________________________    Tedd Favorite  Dela Favor, NP [facility]  I have seen and examined the patient. I have discussed the assessment and recommendations with the Neurology NP. 89 year old female presenting with transient aphasia and left facial weakness. Exam is nonfocal. DDx includes TIA involving the left MCA territory. Recommendations as above.  Electronically signed: Dr. Kamarian Sahakian

## 2024-03-31 NOTE — H&P (Addendum)
 History and Physical    Patient: Zoe [name]:[id number] DOB: [birth year] DOA: 03/30/2024 DOS: the patient was seen and examined on 03/31/2024 PCP: Pete Brand, DO  Patient coming from: ALF/ILF  Chief Complaint:  Chief Complaint  Patient presents with   Aphasia   HPI: Zoe  B [name] is a 89 y.o. female with medical history significant of history of TAVR, PAF/complete heart block status post permanent pacemaker, hypertension with underlying HFpEF, dyslipidemia, hypothyroidism, IgG lambda multiple myeloma, chronic kidney disease stage III.  She presented to the [facility] med [facility] for sudden onset of slurred speech and facial droop 20 minutes prior to arrival.  She was complaining of a headache.  Patient clarified her symptoms only lasted about 5 minutes then spontaneously resolved.  CT angio of the head and neck done at the med center revealed no evidence of acute stroke but did reveal findings consistent with fibromuscular dysplasia within the mid and distal right cervical ICA in the mid left cervical ICA.  An MRI initially was not completed because it was unknown at the time if the patient had an MRI safe pacemaker.  Because of her strokelike symptoms she was subsequently transported to [facility] to the neurological floor for further evaluation and workup.  Upon our evaluation of the patient her neurological exam was unremarkable with no focal deficits.  She was primarily complaining of bilateral hip pain radiating up towards the abdomen and back.  She reports about 1 week ago she had a mechanical fall stating she landed on her buttocks with her legs outstretched in front of her.  She reports chronic balance issues.  She also has some chronic musculoskeletal pain in her neck.  Because of her pain EDP had given her Tylenol  which helped her pain.  Note there was an incidental finding on her CTA of an irregularity at the T3 superior endplate with mid height loss anteriorly with MRI thoracic  spine recommended.  She does report that for several weeks her blood pressure has been running higher than normal with systolic readings in the 150-160 range.  She recently had follow-up bilateral carotid duplex imaging May 29.  Her last echocardiogram was in March.  And as noted she has not had any reemergence of the symptoms that caused her to present to the ED.  Review of Systems: As mentioned in the history of present illness. All other systems reviewed and are negative. Past Medical History:  Diagnosis Date   Arthritis    some - per patient   Atrial fibrillation (HCC)    Breast cancer (HCC)    breast cancer / left    Cataract    bilat    GERD (gastroesophageal reflux disease)    History of kidney stones    Hyperlipidemia    Hypertension    Hypothyroidism    Macular degeneration    Left   S/P TAVR (transcatheter aortic valve replacement) 09/03/2018   23 mm Edwards Sapien 3 transcatheter heart valve placed via percutaneous right transfemoral approach    Severe aortic stenosis    Stress incontinence    Thyroid  disease    Tinnitus    Past Surgical History:  Procedure Laterality Date   ABDOMINAL HYSTERECTOMY  1970's   BACK SURGERY     BREAST LUMPECTOMY  12/1998   lumpectomy   CARDIAC CATHETERIZATION     CARDIOVERSION N/A 04/18/2023   Procedure: CARDIOVERSION;  Surgeon: Olinda Bertrand, DO;  Location: [facility] INVASIVE CV LAB;  Service: Cardiovascular;  Laterality: N/A;  CARDIOVERSION N/A 04/20/2023   Procedure: CARDIOVERSION;  Surgeon: Knox Perl, MD;  Location: [facility] INVASIVE CV LAB;  Service: Cardiovascular;  Laterality: N/A;   EYE SURGERY     cataract surgery bilat    INTRAOPERATIVE TRANSTHORACIC ECHOCARDIOGRAM N/A 09/03/2018   Procedure: INTRAOPERATIVE TRANSTHORACIC ECHOCARDIOGRAM;  Surgeon: Odie Benne, MD;  Location: [facility] OR;  Service: Open Heart Surgery;  Laterality: N/A;   KYPHOPLASTY N/A 09/07/2022   Procedure: THORACIC EIGHT KYPHOPLASTY;  Surgeon: Mort Ards, MD;   Location: [facility] OR;  Service: Orthopedics;  Laterality: N/A;  1 hr Local with [facility] 3 C-Bed   LITHOTRIPSY     PACEMAKER IMPLANT N/A 01/09/2024   Procedure: PACEMAKER IMPLANT;  Surgeon: Tammie Fall, MD;  Location: [facility] INVASIVE CV LAB;  Service: Cardiovascular;  Laterality: N/A;   Right total knee     2018 Dr. Bernard Brick   RIGHT/LEFT HEART CATH AND CORONARY ANGIOGRAPHY N/A 08/06/2018   Procedure: RIGHT/LEFT HEART CATH AND CORONARY ANGIOGRAPHY;  Surgeon: Knox Perl, MD;  Location: [facility] INVASIVE CV LAB;  Service: Cardiovascular;  Laterality: N/A;   THYROIDECTOMY, PARTIAL  1975   TONSILLECTOMY     as a child - patient not sure of exact date   TOTAL KNEE ARTHROPLASTY Left 03/13/2016   Procedure: TOTAL KNEE ARTHROPLASTY;  Surgeon: Claiborne Crew, MD;  Location: [facility] ORS;  Service: Orthopedics;  Laterality: Left;   TOTAL KNEE ARTHROPLASTY Right 06/18/2017   Procedure: RIGHT TOTAL KNEE ARTHROPLASTY;  Surgeon: Claiborne Crew, MD;  Location: [facility] ORS;  Service: Orthopedics;  Laterality: Right;   TRANSCATHETER AORTIC VALVE REPLACEMENT, TRANSFEMORAL N/A 09/03/2018   Procedure: TRANSCATHETER AORTIC VALVE REPLACEMENT, TRANSFEMORAL;  Surgeon: Odie Benne, MD;  Location: [facility] OR;  Service: Open Heart Surgery;  Laterality: N/A;   Social History:  reports that she has never smoked. She has never used smokeless tobacco. She reports that she does not drink alcohol  and does not use drugs.  Allergies  Allergen Reactions   Quinolones Other (See Comments)    Patient on Amiodarone  and can prolong QT   Penicillins Other (See Comments)    UNSPECIFIED REACTION  Patient does not remember reaction.  Has patient had a PCN reaction causing immediate rash, facial/tongue/throat swelling, SOB or lightheadedness with hypotension: no Has patient had a PCN reaction causing severe rash involving mucus membranes or skin necrosis: no Has patient had a PCN reaction that required hospitalization no Has patient had a PCN reaction  occurring within the last 10 years: no If all of the above answers are "NO", then may proceed with Cephalosporin use.    Sulfa Antibiotics Other (See Comments)    UNSPECIFIED REACTION  "maybe vision issues? "    Family History  Problem Relation Age of Onset   Diabetes Mother    Stroke Mother        Carotid artery disease   Heart disease Father        CAD   Coronary artery disease Father    Diabetes Sister     Prior to Admission medications   Medication Sig Start Date End Date Taking? Authorizing Provider  ALPRAZolam (XANAX) 0.25 MG tablet Take by mouth. 03/06/24  Yes [provider]  metoprolol  tartrate (LOPRESSOR ) 50 MG tablet Take 50 mg by mouth 2 (two) times daily. 03/16/24  Yes [provider]  potassium chloride  SA (KLOR-CON  M) 20 MEQ tablet Take 20 mEq by mouth daily. 03/06/24  Yes [provider]  acetaminophen  (TYLENOL ) 500 MG tablet Take 500 mg by mouth  every morning.    [provider]  acyclovir  (ZOVIRAX ) 400 MG tablet Take 1 tablet (400 mg total) by mouth 2 (two) times daily. 12/17/23   Ander Bame, MD  amiodarone  (PACERONE ) 200 MG tablet Take 0.5 tablets (100 mg total) by mouth daily. 11/26/23   Knox Perl, MD  amLODipine  (NORVASC ) 5 MG tablet Take 5 mg by mouth daily.    [provider]  apixaban  (ELIQUIS ) 5 MG TABS tablet Take 1 tablet (5 mg total) by mouth 2 (two) times daily. 11/15/23   Knox Perl, MD  Artificial Tear Solution (SOOTHE XP) SOLN Place 1 drop into both eyes every evening.    [provider]  busPIRone  (BUSPAR ) 5 MG tablet Take 5 mg by mouth 2 (two) times daily.    [provider]  Cholecalciferol  (VITAMIN D3) 50 MCG (2000 UT) capsule Take 1 capsule (2,000 Units total) by mouth daily. Patient taking differently: Take 1,000 Units by mouth daily. 09/11/22   Ander Bame, MD  [employer] VITAMIN B12 1000 MCG tablet TAKE 1 TABLET BY MOUTH EVERY DAY 02/15/23   Thayil, Irene T, PA-C  docusate sodium   (COLACE) 100 MG capsule Take 100 mg by mouth daily as needed for mild constipation.    [provider]  furosemide  (LASIX ) 20 MG tablet Take 1 tablet (20 mg total) by mouth as needed for edema. 10/23/23 01/21/24  Lamond Pilot, PA  lenalidomide  (REVLIMID ) 10 MG capsule Take 1 capsule (10 mg total) by mouth daily. Auth # [id number]  Date Obtained 03/10/24 Take 1 capsule daily for 21 days then none for 7 days 03/10/24   Ander Bame, MD  levothyroxine  (SYNTHROID , LEVOTHROID) 100 MCG tablet Take 100 mcg by mouth daily before breakfast. 12/29/15   [provider]  lidocaine  (LIDODERM ) 5 % Place 1 patch onto the skin daily. Remove & Discard patch within 12 hours or as directed by MD 02/22/24   Denese Finn, PA-C  losartan  (COZAAR ) 50 MG tablet Take 1 tablet (50 mg total) by mouth daily. 01/14/24   Lesa Rape, MD  methylPREDNISolone  (MEDROL  DOSEPAK) 4 MG TBPK tablet Follow package insert 02/23/24   Curatolo, Adam, DO  metoprolol  succinate (TOPROL -XL) 50 MG 24 hr tablet Take 1 tablet (50 mg total) by mouth daily. Take with or immediately following a meal. 01/14/24   Lesa Rape, MD  Multiple Vitamins-Minerals (PRESERVISION AREDS) CAPS Take 1 capsule by mouth 2 (two) times daily.    [provider]  oxyCODONE  (ROXICODONE ) 5 MG immediate release tablet Take 1 tablet (5 mg total) by mouth every 6 (six) hours as needed for up to 10 doses. 02/23/24   Curatolo, Adam, DO  pantoprazole  (PROTONIX ) 40 MG tablet Take 1 tablet (40 mg total) by mouth daily at 6 (six) AM. 10/03/23   Armenta Landau, MD  polyethylene glycol (MIRALAX  / GLYCOLAX ) 17 g packet Take 17 g by mouth daily as needed for mild constipation.    [provider]  potassium chloride  (KLOR-CON ) 10 MEQ tablet Take 1 tablet (10 mEq total) by mouth as needed (TAKE WITH LASIX  FOR SWELLING). 10/23/23 01/21/24  Lamond Pilot, PA  pravastatin  (PRAVACHOL ) 40 MG tablet Take 40 mg by mouth every evening.    [provider]  traMADol  (ULTRAM ) 50 MG tablet Take 50 mg by mouth daily.    [provider]    Physical Exam: Vitals:   03/31/24 0552 03/31/24 0915 03/31/24 1129 03/31/24 1130  BP:  Aaron Aas)  166/75 (!) 144/70 (!) 144/70  Pulse:  71 68 68  Resp:  16  14  Temp: 98.3 F (36.8 C) 98.5 F (36.9 C) 97.7 F (36.5 C) 97.7 F (36.5 C)  TempSrc: Oral Oral  Oral  SpO2:  98% 98% 98%   Constitutional: NAD, calm, comfortable Respiratory: clear to auscultation bilaterally, no wheezing, no crackles. Normal respiratory effort. No accessory muscle use.  Room air Cardiovascular: Irregular rhythm with underlying atrial fibrillation, nontachycardic.  Soft grade 1/6 systolic murmur, no rubs / gallops. No extremity edema. 2+ pedal pulses. No carotid bruits.  Abdomen: no tenderness, no masses palpated. No hepatosplenomegaly. Bowel sounds positive.  Musculoskeletal: no clubbing / cyanosis. No joint deformity upper and lower extremities. Good ROM, no contractures. Normal muscle tone.  No pain with palpating over anterior pelvis.  No pain elicited with palpation over the mid back Skin: no rashes, lesions, ulcers. No induration Neurologic: CN 2-12 grossly intact. Sensation intact, Strength 5/5 x all 4 extremities.  Psychiatric: Normal judgment and insight. Alert and oriented x 3. Normal mood.    Data Reviewed:  Sodium 138, potassium 3.9, glucose 94, BUN 31, creatinine 1.10 with a GFR of 48  WBC 5500, hemoglobin 12.5, platelets 79,000, differential normal  Assessment and Plan: Strokelike symptoms Presented with solitary episode of dysarthria and apparent facial droop.  Duration of symptoms 5 minutes and has not recurred Initial CTA of head and neck unremarkable in regards to explanation for symptoms MRI brain pending Patient had bilateral left carotid duplex ultrasound imaging 5/29 that demonstrated bilateral 1 to 39% stenosis Echocardiogram was completed March 2025 Check hemoglobin A1c and lipid  panel  PAF/complete heart block/permanent pacemaker Continue Eliquis , amiodarone  Remains in A-fib with controlled ventricular response  Mechanical fall at home 1 week ago Pelvic pain Incidental finding of possible T3 endplate fracture on imaging She reports pain is not very severe although sometimes it is worse when laying in the bed Pain is controlled with Tylenol  Will obtain MRI of thoracic spine to better characterize T3 abnormality seen on CTA Obtain plain pelvic films  HFpEF Compensated without signs of heart failure If no CVA detected on MRI can resume Cozaar , metoprolol  and diuretic Last echocardiogram March 2025:  IgG lambda multiple myeloma Last evaluated by hematology on 5/8.  Revlimid  remains on hold with recommendation to follow-up with repeat labs in 4 weeks reassessment Platelets have been trending downward-clear if this is sole effect of Eliquis  or if related to prior Revlimid  usage  Thrombocytopenia Platelets were within normal limits on 5/8 at 270,000 although in the past has been as low as 102,000 Follow-up labs 6/2 platelets were 96,000 and platelets on 6/8 were 79,000 Discussed with her cardiologist Dr. Berry Bristol and given chronicity of thrombocytopenia okay to continue Eliquis  but do not give any aspirin . Follow-up with hematology as scheduled  CKD 3 Creatinine stable and at baseline  Dyslipidemia Continue pravastatin   History of TAVR Subtle grade 1/6 systolic murmur  Hypothyroidism Continue Synthroid  once med rec completed    Advance Care Planning:   Code Status: Full Code   VTE prophylaxis: Eliquis   Consults: Neurology  Family Communication: Patient only  Severity of Illness: The appropriate patient status for this patient is OBSERVATION. Observation status is judged to be reasonable and necessary in order to provide the required intensity of service to ensure the patient's safety. The patient's presenting symptoms, physical exam findings, and  initial radiographic and laboratory data in the context of their medical condition is felt to place  them at decreased risk for further clinical deterioration. Furthermore, it is anticipated that the patient will be medically stable for discharge from the hospital within 2 midnights of admission.   Author: Kathye Parkin, NP 03/31/2024 12:37 PM  For on call review [email or web address].

## 2024-03-31 NOTE — Progress Notes (Signed)
 SLP Cancellation Note  Patient Details Name: Zoe [name] MRN: [id number] DOB: Dec 14, 1933   Cancelled treatment:       Reason Eval/Treat Not Completed: SLP screened, no needs identified, will sign off   Raphaella Larkin, Hardin Leys 03/31/2024, 2:41 PM

## 2024-03-31 NOTE — ED Notes (Signed)
 [facility] en route to [facility] w/ pt

## 2024-03-31 NOTE — Evaluation (Signed)
 Physical Therapy Evaluation Patient Details Name: Zoe [name] MRN: [id number] DOB: 06-26-1934 Today's Date: 03/31/2024  History of Present Illness  Pt is 89 year old [facility] with L facial droop and difficulty speaking. Symptoms resolved en route to hospital. PMH - A-fib on Eliquis  and amiodarone , hypothyroidism, HTN, breast cancer, multiple myeloma,aortic stenosis status post TAVR,  PPM placed 3/19, TIA, bil TKA, CHF.  Clinical Impression  Patient presents with decreased balance though not far from baseline.  States recently moved to [facility] and hoping to start PT there.  States feels her symptoms from recent event have resolved and now heading down for MRI after toileting.  Patient appropriate for home with follow up HHPT.  If she does not d/c will follow in the acute setting for balance traning.         If plan is discharge home, recommend the following: A little help with walking and/or transfers;Assist for transportation;Help with stairs or ramp for entrance   Can travel by private vehicle        Equipment Recommendations None recommended by PT  Recommendations for Other Services       Functional Status Assessment Patient has had a recent decline in their functional status and demonstrates the ability to make significant improvements in function in a reasonable and predictable amount of time.     Precautions / Restrictions Precautions Precautions: Fall Precaution/Restrictions Comments: 2 falls in past 6 months      Mobility  Bed Mobility Overal bed mobility: Modified Independent             General bed mobility comments: sit to supine    Transfers Overall transfer level: Needs assistance Equipment used: Rolling walker (2 wheels) Transfers: Sit to/from Stand Sit to Stand: Supervision           General transfer comment: assist for safety, braces with legs on toilet behind her for balance while pulling up briefs     Ambulation/Gait Ambulation/Gait assistance: Supervision, Contact guard assist Gait Distance (Feet): 20 Feet Assistive device: Rolling walker (2 wheels) Gait Pattern/deviations: Step-to pattern, Step-through pattern, Decreased stride length, Trunk flexed, Wide base of support       General Gait Details: lowered COG due to balance issues and poor vision  Stairs            Wheelchair Mobility     Tilt Bed    Modified Rankin (Stroke Patients Only) Modified Rankin (Stroke Patients Only) Pre-Morbid Rankin Score: Moderate disability Modified Rankin: Moderately severe disability     Balance Overall balance assessment: Needs assistance   Sitting balance-Leahy Scale: Good     Standing balance support: No upper extremity supported, During functional activity Standing balance-Leahy Scale: Poor Standing balance comment: crouches down to lower COG And bracing back of legs agains seated surface while performing toilet hygiene                             Pertinent Vitals/Pain Pain Assessment Pain Assessment: No/denies pain    Home Living Family/patient expects to be discharged to:: Private residence Living Arrangements: Alone   Type of Home: Independent living facility (just moved to [facility])         Home Layout: One level Home Equipment: Shower seat;Cane - single point;Rollator (4 wheels);Toilet riser;Rolling Walker (2 wheels);Grab bars - tub/shower;Grab bars - toilet;Hand held shower head Additional Comments: 2 falls recently tripped over a cord and one in the bathroom  Prior Function Prior Level of Function : Needs assist;History of Falls (last six months)             Mobility Comments: rollator for ambulation, help for transportation ADLs Comments: does some meal prep in her apartment, has one meal in dining room     Extremity/Trunk Assessment   Upper Extremity Assessment Upper Extremity Assessment: Defer to OT evaluation    Lower  Extremity Assessment Lower Extremity Assessment: Generalized weakness    Cervical / Trunk Assessment Cervical / Trunk Assessment: Kyphotic  Communication   Communication Communication: No apparent difficulties    Cognition Arousal: Alert Behavior During Therapy: WFL for tasks assessed/performed   PT - Cognitive impairments: No apparent impairments                         Following commands: Intact       Cueing       General Comments General comments (skin integrity, edema, etc.): EAger to get set up with [facility] for therapy at [place]; note significant vision issues throughout session needing help to find the ipad on the b/s table and though linen on her bed was a bag of some sort.    Exercises     Assessment/Plan    PT Assessment Patient needs continued PT services  PT Problem List         PT Treatment Interventions DME instruction;Gait training;Therapeutic activities;Functional mobility training;Balance training;Patient/family education;Therapeutic exercise    PT Goals (Current goals can be found in the Care Plan section)  Acute Rehab PT Goals Patient Stated Goal: to get home and start physical therapy PT Goal Formulation: With patient Time For Goal Achievement: 04/14/24 Potential to Achieve Goals: Good    Frequency Min 2X/week     Co-evaluation               AM-PAC PT "6 Clicks" Mobility  Outcome Measure Help needed turning from your back to your side while in a flat bed without using bedrails?: None Help needed moving from lying on your back to sitting on the side of a flat bed without using bedrails?: None Help needed moving to and from a bed to a chair (including a wheelchair)?: A Little Help needed standing up from a chair using your arms (e.g., wheelchair or bedside chair)?: A Little Help needed to walk in hospital room?: A Little Help needed climbing 3-5 steps with a railing? : A Little 6 Click Score: 20    End of Session    Activity Tolerance: Patient tolerated treatment well Patient left: in bed;Other (comment) (transport arrived to take to MRI)   PT Visit Diagnosis: History of falling (Z91.81);Other abnormalities of gait and mobility (R26.89)    Time: 8756-4332 PT Time Calculation (min) (ACUTE ONLY): 18 min   Charges:   PT Evaluation $PT Eval Low Complexity: 1 Low   PT General Charges $$ ACUTE PT VISIT: 1 Visit         Abigail Hoff, PT Acute Rehabilitation Services [phone] 03/31/2024   Zoe [name] 03/31/2024, 4:20 PM

## 2024-03-31 NOTE — ED Notes (Signed)
 [facility] called for transport

## 2024-03-31 NOTE — Progress Notes (Signed)
 Patient ID: Gaetano Jordan, female   DOB: 02-Nov-1933, 89 y.o.   MRN: [id number] Patient arrived via [facility]. VSS BP (!) 144/70 (BP Location: Left Arm)   Pulse 68   Temp 97.7 F (36.5 C) (Oral)   Resp 14   SpO2 98%  [facility] paged. Cardiac monitor placed. Patient resting comfortably at this time.  Genella Kendall, RN

## 2024-04-01 DIAGNOSIS — E785 Hyperlipidemia, unspecified: Secondary | ICD-10-CM | POA: Diagnosis not present

## 2024-04-01 DIAGNOSIS — R29700 NIHSS score 0: Secondary | ICD-10-CM | POA: Diagnosis not present

## 2024-04-01 DIAGNOSIS — C9000 Multiple myeloma not having achieved remission: Secondary | ICD-10-CM | POA: Diagnosis not present

## 2024-04-01 DIAGNOSIS — Z950 Presence of cardiac pacemaker: Secondary | ICD-10-CM | POA: Diagnosis not present

## 2024-04-01 DIAGNOSIS — I459 Conduction disorder, unspecified: Secondary | ICD-10-CM

## 2024-04-01 DIAGNOSIS — G459 Transient cerebral ischemic attack, unspecified: Secondary | ICD-10-CM

## 2024-04-01 DIAGNOSIS — S22000S Wedge compression fracture of unspecified thoracic vertebra, sequela: Secondary | ICD-10-CM

## 2024-04-01 DIAGNOSIS — I4891 Unspecified atrial fibrillation: Secondary | ICD-10-CM | POA: Diagnosis not present

## 2024-04-01 DIAGNOSIS — I130 Hypertensive heart and chronic kidney disease with heart failure and stage 1 through stage 4 chronic kidney disease, or unspecified chronic kidney disease: Secondary | ICD-10-CM | POA: Diagnosis not present

## 2024-04-01 DIAGNOSIS — N183 Chronic kidney disease, stage 3 unspecified: Secondary | ICD-10-CM

## 2024-04-01 DIAGNOSIS — I5030 Unspecified diastolic (congestive) heart failure: Secondary | ICD-10-CM

## 2024-04-01 DIAGNOSIS — M4850XA Collapsed vertebra, not elsewhere classified, site unspecified, initial encounter for fracture: Secondary | ICD-10-CM

## 2024-04-01 DIAGNOSIS — Z7901 Long term (current) use of anticoagulants: Secondary | ICD-10-CM | POA: Diagnosis not present

## 2024-04-01 DIAGNOSIS — R471 Dysarthria and anarthria: Secondary | ICD-10-CM | POA: Diagnosis not present

## 2024-04-01 DIAGNOSIS — I63542 Cerebral infarction due to unspecified occlusion or stenosis of left cerebellar artery: Secondary | ICD-10-CM

## 2024-04-01 LAB — LIPID PANEL
Cholesterol: 159 mg/dL (ref 0–200)
HDL: 64 mg/dL (ref >40)
LDL Cholesterol: 84 mg/dL (ref 0–99)
Total CHOL/HDL Ratio: 2.5 ratio
Triglycerides: 56 mg/dL (ref <150)
VLDL: 11 mg/dL (ref 0–40)

## 2024-04-01 MED ORDER — POTASSIUM CHLORIDE CRYS ER 20 MEQ PO TBCR: EXTENDED_RELEASE_TABLET | ORAL | Status: DC

## 2024-04-01 MED ORDER — PANTOPRAZOLE SODIUM 40 MG PO TBEC
40.0000 mg | DELAYED_RELEASE_TABLET | Freq: Every day | ORAL | Status: AC
Start: 2024-04-01 — End: ?

## 2024-04-01 MED ORDER — ROSUVASTATIN CALCIUM 20 MG PO TABS
20.0000 mg | ORAL_TABLET | Freq: Every day | ORAL | Status: DC
  Administered 2024-04-01: 20 mg via ORAL
  Filled 2024-04-01: qty 1

## 2024-04-01 MED ORDER — FUROSEMIDE 20 MG PO TABS: ORAL_TABLET | ORAL | Status: DC

## 2024-04-01 MED ORDER — METOPROLOL TARTRATE 50 MG PO TABS: 50.0000 mg | ORAL_TABLET | Freq: Two times a day (BID) | ORAL | Status: DC

## 2024-04-01 MED ORDER — ROSUVASTATIN CALCIUM 20 MG PO TABS: 20.0000 mg | ORAL_TABLET | Freq: Every day | ORAL | 3 refills | Status: DC

## 2024-04-01 NOTE — Evaluation (Addendum)
 Occupational Therapy Evaluation Patient Details Name: Zoe [name] MRN: [id number] DOB: July 10, 1934 Today's Date: 04/01/2024   History of Present Illness   Pt is 89 year old presented to [facility] on 03/30/24 with L facial droop and difficulty speaking. Symptoms resolved en route to hospital. PMH - A-fib on Eliquis  and amiodarone , hypothyroidism, HTN, breast cancer, multiple myeloma,aortic stenosis status post TAVR,  PPM placed 3/19, TIA, bil TKA, CHF.     Clinical Impressions Zoe [name] was evaluated s/p the above admission list. She is mod I for ADLs and functional mobility at baseline, she resides at [facility] and participates in daily exercise classes there. Upon evaluation the pt was limited by decreased activity tolerance and reports that she "just doesn't feel like herself." Overall she was able to complete ADLs and functional mobility with RW and mod I, no safety concerns noted. Pt's vision was screened with no impairments noted functionally or through assessment. Pt does not demonstrate the need for acute OT. Recommend d/c home with encouragement to continue participating in exercise classes, planting tomato plants in courtyard and daily walks to/from cafeteria. No HHOT needed.       If plan is discharge home, recommend the following:   Help with stairs or ramp for entrance;Assistance with cooking/housework     Functional Status Assessment   Patient has had a recent decline in their functional status and demonstrates the ability to make significant improvements in function in a reasonable and predictable amount of time.     Equipment Recommendations   None recommended by OT     Precautions/Restrictions   Precautions Precautions: Fall Precaution/Restrictions Comments: 2 falls in past 6 months Restrictions Weight Bearing Restrictions Per Provider Order: No     Mobility Bed Mobility Overal bed mobility: Modified Independent                  Transfers Overall  transfer level: Modified independent Equipment used: Rolling walker (2 wheels)               General transfer comment: mod I to/from multiple surfaces      Balance Overall balance assessment: Needs assistance   Sitting balance-Leahy Scale: Good     Standing balance support: Single extremity supported, During functional activity Standing balance-Leahy Scale: Fair                             ADL either performed or assessed with clinical judgement   ADL Overall ADL's : Modified independent;At baseline                                       General ADL Comments: Pt demonstrated mod I ability to complete toileting, grooming, LB dressing and functional mobiliity with RW in the room. Pt reports that she does not feel like herself however it does not impact her ability to complete ADLs     Vision Baseline Vision/History: 1 Wears glasses Vision Assessment?: No apparent visual deficits Additional Comments: visual screen [facility]     Perception Perception: Not tested       Praxis Praxis: WFL       Pertinent Vitals/Pain Pain Assessment Pain Assessment: No/denies pain     Extremity/Trunk Assessment Upper Extremity Assessment Upper Extremity Assessment: Overall WFL for tasks assessed   Lower Extremity Assessment Lower Extremity Assessment: Defer to PT evaluation   Cervical / Trunk Assessment  Cervical / Trunk Assessment: Kyphotic   Communication Communication Communication: No apparent difficulties   Cognition Arousal: Alert Behavior During Therapy: WFL for tasks assessed/performed                                 Following commands: Intact       Cueing  General Comments      VSS, vision was [facility] functionally and on assessment this date           Home Living Family/patient expects to be discharged to:: Private residence Living Arrangements: Alone Available Help at Discharge: Family;Available PRN/intermittently Type  of Home: Independent living facility Home Access: Stairs to enter Entrance Stairs-Number of Steps: 1 Entrance Stairs-Rails: None Home Layout: One level     Bathroom Shower/Tub: Producer, television/film/video: Handicapped height Bathroom Accessibility: Yes   Home Equipment: Shower seat;Cane - single point;Rollator (4 wheels);Toilet riser;Rolling Walker (2 wheels);Grab bars - tub/shower;Grab bars - toilet;Hand held shower head   Additional Comments: 2 falls recently tripped over a cord and one in the bathroom      Prior Functioning/Environment Prior Level of Function : Needs assist;History of Falls (last six months)             Mobility Comments: rollator for ambulation, help for transportation ADLs Comments: mod I ADLs. does some meal prep in her apartment, has one meal in dining room    OT Problem List: Decreased activity tolerance   OT Treatment/Interventions:        OT Goals(Current goals can be found in the care plan section)   Acute Rehab OT Goals Patient Stated Goal: home today OT Goal Formulation: With patient Time For Goal Achievement: 04/01/24 Potential to Achieve Goals: Good   AM-PAC OT "6 Clicks" Daily Activity     Outcome Measure Help from another person eating meals?: None Help from another person taking care of personal grooming?: None Help from another person toileting, which includes using toliet, bedpan, or urinal?: None Help from another person bathing (including washing, rinsing, drying)?: None Help from another person to put on and taking off regular upper body clothing?: None Help from another person to put on and taking off regular lower body clothing?: None 6 Click Score: 24   End of Session Equipment Utilized During Treatment: Rolling walker (2 wheels) Nurse Communication: Mobility status  Activity Tolerance: Patient tolerated treatment well Patient left: in chair;with call bell/phone within reach  OT Visit Diagnosis: Muscle weakness  (generalized) (M62.81);History of falling (Z91.81)                Time: 0960-4540 OT Time Calculation (min): 22 min Charges:  OT General Charges $OT Visit: 1 Visit OT Evaluation $OT Eval Low Complexity: 1 Low  Zoe [name], OTR/L Acute Rehabilitation Services Office [phone] Secure Chat Communication Preferred   Starla Easterly 04/01/2024, 11:02 AM

## 2024-04-01 NOTE — Care Management Obs Status (Signed)
 MEDICARE OBSERVATION STATUS NOTIFICATION   Patient Details  Name: Zoe [name] MRN: [id number] Date of Birth: [birth year]   Medicare Observation Status Notification Given:  Yes  Letter done  Wynonia Hedges 04/01/2024, 7:52 AM

## 2024-04-01 NOTE — TOC Transition Note (Signed)
 Transition of Care [facility]) - Discharge Note   Patient Details  Name: Zoe  Zoe [name] MRN: [id number] Date of Birth: 1934/06/22  Transition of Care [facility]) CM/SW Contact:  Zoe Neighbor, RN Phone Number: 04/01/2024, 11:10 AM   Clinical Narrative:     Pt is from [facility]. She will return with [facility] therapies through [facility]. CM has faxed the referral to [facility] with [facility]: [phone]. [facility] will contact her for the first appointment.  Pt also does an exercise class at [facility].  DME at home: rollator and shower seat Her daughter in law or friends provide needed transportation. She has a CNA through [employer] that brings her medications to her 4 x per day.  DIL will provide transportation home.   Final next level of care: Home w Home Health Services Barriers to Discharge: No Barriers Identified   Patient Goals and CMS Choice   CMS Medicare.gov Compare Post Acute Care list provided to:: Patient Choice offered to / list presented to : Patient      Discharge Placement                       Discharge Plan and Services Additional resources added to the After Visit Summary for                            [facility] Arranged: PT, OT HH Agency: Other - See comment International aid/development worker) Date [facility] Agency Contacted: 04/01/24   Representative spoke with at [facility] Agency: Leward Record  Social Drivers of Health (SDOH) Interventions SDOH Screenings   Food Insecurity: No Food Insecurity (04/01/2024)  Housing: Low Risk  (04/01/2024)  Transportation Needs: No Transportation Needs (04/01/2024)  Utilities: Not At Risk (04/01/2024)  Depression (PHQ2-9): Low Risk  (11/18/2018)  Financial Resource Strain: Low Risk  (11/12/2018)  Physical Activity: Inactive (11/12/2018)  Social Connections: Moderately Isolated (04/01/2024)  Stress: Stress Concern Present (11/12/2018)  Tobacco Use: Low Risk  (03/30/2024)     Readmission Risk Interventions     No data to display

## 2024-04-01 NOTE — Discharge Instructions (Signed)

## 2024-04-01 NOTE — Progress Notes (Addendum)
 STROKE TEAM PROGRESS NOTE   INTERIM HISTORY/SUBJECTIVE  No family at the bedside.  Patient is lying in the bed in no apparent distress She is anxiously waiting to be discharged She states that her stuttering speech has improved has not had it happen again  CBC    Component Value Date/Time   WBC 5.5 03/30/2024 1415   RBC 4.23 03/30/2024 1415   HGB 12.5 03/30/2024 1415   HGB 13.3 03/24/2024 1016   HGB 10.4 (L) 07/16/2019 0941   HCT 38.2 03/30/2024 1415   HCT 32.0 (L) 07/16/2019 0941   PLT 79 (L) 03/30/2024 1415   PLT 96 (L) 03/24/2024 1016   PLT 194 07/16/2019 0941   MCV 90.3 03/30/2024 1415   MCV 83 07/16/2019 0941   MCH 29.6 03/30/2024 1415   MCHC 32.7 03/30/2024 1415   RDW 17.2 (H) 03/30/2024 1415   RDW 16.9 (H) 07/16/2019 0941   LYMPHSABS 0.9 03/30/2024 1415   MONOABS 0.8 03/30/2024 1415   EOSABS 0.1 03/30/2024 1415   BASOSABS 0.0 03/30/2024 1415    BMET    Component Value Date/Time   NA 138 03/30/2024 1415   NA 130 (L) 06/19/2022 1012   K 3.9 03/30/2024 1415   CL 100 03/30/2024 1415   CO2 24 03/30/2024 1415   GLUCOSE 94 03/30/2024 1415   BUN 31 (H) 03/30/2024 1415   BUN 21 06/19/2022 1012   CREATININE 1.10 (H) 03/30/2024 1415   CREATININE 1.17 (H) 03/24/2024 1016   CALCIUM  8.9 03/30/2024 1415   EGFR 59 (L) 06/19/2022 1012   GFRNONAA 48 (L) 03/30/2024 1415   GFRNONAA 45 (L) 03/24/2024 1016    IMAGING past 24 hours MR THORACIC SPINE WO CONTRAST Result Date: 04/01/2024 CLINICAL DATA:  Initial evaluation for compression fracture. EXAM: MRI THORACIC SPINE WITHOUT CONTRAST TECHNIQUE: Multiplanar, multisequence MR imaging of the thoracic spine was performed. No intravenous contrast was administered. COMPARISON:  Comparison made with prior MRI from 10/08/2022. FINDINGS: Alignment: Mild exaggeration of the normal thoracic kyphosis. Trace facet mediated anterolisthesis of T2 on T3. Vertebrae: Chronic compression fractures of T8 and T12 with sequelae of prior vertebral  augmentation. Additional mild chronic compression deformities involving the superior endplates of T3, L1, and L3 without significant bony retropulsion. Vertebral body height otherwise maintained without acute or recent fracture. Bone marrow signal intensity within normal limits. No worrisome osseous lesions. Schmorl's node deformity with mild marrow edema within the partially visualized L3 vertebral body noted. Reactive marrow edema present about the T2-3 facets bilaterally due to facet arthritis. Abnormal marrow edema seen involving the right transverse processes of T12 and L1, raising the possibility for possible acute and/or recent fractures (series 13, image 5). Cord:  Normal signal and morphology. Paraspinal and other soft tissues: Paraspinous soft tissues within normal limits. Atherosclerotic change noted about the visualized aorta. Disc levels: T1-2: Mild disc bulge. Mild bilateral facet hypertrophy. No spinal stenosis. Mild left with moderate right foraminal stenosis. T2-3: Trace anterolisthesis. Minimal disc bulge. Right worse than left facet hypertrophy with associated reactive marrow edema. No spinal stenosis. Mild bilateral foraminal narrowing. T3-4: Negative interspace. Right worse than left facet hypertrophy. No spinal stenosis. Mild bilateral foraminal narrowing. T4-5: Degenerative intervertebral disc space narrowing, with mild disc bulge and endplate spurring. Small central disc protrusion minimally indents the ventral thecal sac (series 15, image 15). Minimal cord flattening without cord signal changes. Mild posterior element hypertrophy. No spinal stenosis. Mild bilateral foraminal narrowing. T5-6: Degenerative intervertebral disc space narrowing with mild disc bulge and  reactive endplate spurring. Mild facet degeneration. No spinal stenosis. Mild bilateral foraminal narrowing. T6-7: Degenerative intervertebral disc space narrowing. Right paracentral disc protrusion indents the right ventral thecal  sac (series 15, image 23). Mild endplate spurring. No spinal stenosis. Foramina remain patent. T7-8: Disc desiccation without significant disc bulge. Endplate spurring. Mild facet degeneration. No spinal stenosis. Mild bilateral foraminal stenosis. T8-9: Mild disc bulge. Superimposed right paracentral disc protrusion flattens the right ventral thecal sac (series 16, image 31). Mild facet hypertrophy. No significant spinal stenosis. Mild bilateral foraminal narrowing. T9-10: Diffuse disc bulge with reactive endplate spurring. Superimposed right paracentral to foraminal disc protrusion. Mild bilateral facet hypertrophy. No significant spinal stenosis. Mild to moderate right greater than left foraminal stenosis. T10-11: Minimal annular disc bulge. Mild facet hypertrophy. No spinal stenosis. Mild bilateral foraminal narrowing. T11-12: Mild disc bulge. Mild bilateral facet hypertrophy. No spinal stenosis. Foramina remain patent. T12-L1: Degenerative intervertebral disc space narrowing with diffuse disc bulge. Reactive endplate spurring. Mild facet hypertrophy. No spinal stenosis. Foramina remain patent. IMPRESSION: 1. Abnormal marrow edema involving the right transverse processes of T12 and L1, raising the possibility for acute and/or recent fractures. Correlation with physical exam for pain at these locations recommended. Further evaluation with dedicated CT of the lumbar spine could be performed for confirmatory purposes as warranted. 2. No other acute or recent compression fracture within the thoracic spine. 3. Chronic compression fractures of T8 and T12 with sequelae of prior vertebral augmentation. Additional mild chronic compression deformities involving the superior endplates of T3, L1, and L3 without significant retropulsion. 4. Multilevel thoracic spondylosis without significant spinal stenosis. Mild to moderate multilevel foraminal narrowing as above. 5. Reactive marrow edema about the T2-3 facets bilaterally  due to facet arthritis. Finding could serve as a source for back pain. Electronically Signed   By: Virgia Griffins M.D.   On: 04/01/2024 05:20   MR BRAIN WO CONTRAST Result Date: 04/01/2024 CLINICAL DATA:  Initial evaluation for acute neuro deficit, stroke suspected. EXAM: MRI HEAD WITHOUT CONTRAST TECHNIQUE: Multiplanar, multiecho pulse sequences of the brain and surrounding structures were obtained without intravenous contrast. COMPARISON:  CT from 03/30/2024 FINDINGS: Brain: Diffuse prominence of the CSF containing spaces compatible generalized cerebral atrophy. Patchy T2/FLAIR hyperintensity involving the periventricular deep white matter both cerebral hemispheres, consistent with chronic small vessel ischemic disease, mild to moderate in nature. Few small remote cerebellar infarcts noted. Single possible punctate focus of diffusion signal abnormality noted involving the left cerebellum (series 2, image 14). This is not well seen on corresponding sequences. While this finding could be artifactual nature, a possible small acute ischemic nonhemorrhagic infarct is difficult to exclude, and could be considered in the correct clinical setting. No other evidence for acute or subacute ischemia. Gray-white matter differentiation otherwise maintained. No acute or chronic intracranial blood products. No mass lesion, midline shift or mass effect. No hydrocephalus or extra-axial fluid collection. Pituitary gland within normal limits. Vascular: Major intracranial vascular flow voids are maintained. Skull and upper cervical spine: Craniocervical junction within normal limits. Bone marrow signal intensity normal. No scalp soft tissue abnormality. Sinuses/Orbits: Prior bilateral ocular lens replacement. Changes of chronic sphenoid ethmoidal sinus disease noted. No significant mastoid effusion. Other: None. IMPRESSION: 1. Single apparent punctate focus of diffusion signal abnormality involving the left cerebellum as  above. While this finding could potentially be artifactual nature, a possible small acute ischemic nonhemorrhagic infarct is difficult to exclude, and could be considered in the correct clinical setting. 2. No other acute intracranial abnormality. 3.  Generalized cerebral atrophy with mild to moderate chronic small vessel ischemic disease, with a few small remote cerebellar infarcts. Electronically Signed   By: Virgia Griffins M.D.   On: 04/01/2024 04:31   DG Pelvis 1-2 Views Result Date: 03/31/2024 CLINICAL DATA:  [id number] Pelvic pain [id number] EXAM: PELVIS - 1-2 VIEW COMPARISON:  X-ray pelvis 01/08/2024 FINDINGS: There is no evidence of pelvic fracture or diastasis. No acute displaced fracture or dislocation of either hips. No pelvic bone lesions are seen. Atherosclerotic plaque. IMPRESSION: Negative for acute traumatic injury. Electronically Signed   By: Morgane  Naveau M.D.   On: 03/31/2024 17:34    Vitals:   03/31/24 2015 04/01/24 0031 04/01/24 0417 04/01/24 0744  BP: (!) 153/58 (!) 166/67 (!) 162/86 (!) 146/77  Pulse: 83 72 73 84  Resp: 18 18 18 19   Temp: 98.1 F (36.7 C) 97.7 F (36.5 C) 97.6 F (36.4 C) 98.3 F (36.8 C)  TempSrc: Oral Oral Oral   SpO2: 99% 98% 98% 99%     PHYSICAL EXAM General:  Alert, well-nourished, well-developed patient in no acute distress Psych:  Mood and affect appropriate for situation CV: Regular rate and rhythm on monitor Respiratory:  Regular, unlabored respirations on room air GI: Abdomen soft and nontender   NEURO:  Mental Status: AA&Ox3, patient is able to give clear and coherent history Speech/Language: speech is without dysarthria or aphasia.  Naming, repetition, fluency, and comprehension intact.  Cranial Nerves:  II: PERRL. Visual fields full.  III, IV, VI: EOMI. Eyelids elevate symmetrically.  V: Sensation is intact to light touch and symmetrical to face.  VII: Face is symmetrical resting and smiling VIII: hearing intact to voice. IX,  X: Palate elevates symmetrically. Phonation is normal.  WJ:[id number] shrug 5/5. XII: tongue is midline without fasciculations. Motor: 5/5 strength to all muscle groups tested.  Tone: is normal and bulk is normal Sensation- Intact to light touch bilaterally. Extinction absent to light touch to DSS.   Coordination: FTN intact bilaterally, HKS: no ataxia in BLE.No drift.  Gait- deferred  Most Recent NIH 0   ASSESSMENT/PLAN  [name] is a 89 y.o. female with history of afib on eliquis , TAVR, PAF/complete heart block status post permanent pacemaker, hypertension with underlying HFpEF, dyslipidemia, hypothyroidism, chronic kidney disease stage III presenting with "20 min" episode of difficulty getting words out, left-sided facial weakness. Aaron Aas  NIH on Admission 0  TIA Left cerebellar punctate infarct, incidental finding, likely from afib even on eliquis  CT head No acute abnormality. 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 Chronic microvascular ischemic changes.  CTA head & neck no LVO. findings suggestive of fibromuscular dysplasia of bilateral cervical ICA  MRI negative (potential left cerebellar signal abnormality would not explain dysarthria/droop on admission)  Carotid Doppler  5/29 negative 2D Echo 12/2023 EF 60 to 65%. Left atrium severely dilated LDL 84 HgbA1c 5.1 VTE prophylaxis -on Eliquis  Eliquis  prior to admission, now on home Eliquis  5 bid Therapy recommendations:  [facility] PT Disposition: Pending  Hx of stroke/TIA Remote infarct in the right cerebellum on neuro imaging  4-02/2023 following with Dr. Mark Sil, complaining some episodes of confusion. MRI done at that time, no acute finding but chronic right cerebellum infarct.  Following with Dr. Mark Sil as outpt  Atrial fibrillation Home Meds: Eliquis , amiodarone  100 mg, Lopressor  50 mg, Lasix  20 mg Continue telemetry monitoring Continue Eliquis  Continue amiodarone  and lopressor    HFpEF CHB s/p PPM S/p TAVR Follow up  with cardiology EF 60-65% in 12/2023 On  lopressor   Hypertension Home meds: Amlodipine  5 mg, losartan  50 mg, lopressor  50 Stable on the high end Resume lopressor  Long term BP goal normotensive  Hyperlipidemia Home meds: Pravastatin  40 mg, resumed in hospital LDL 84, goal < 70 Changed to Crestor 20 mg Continue statin at discharge  Other Stroke Risk Factors Advanced age Obesity, BMI >/= 30 associated with increased stroke risk, recommend weight loss, diet and exercise as appropriate   Other Active Problems Multiple myeloma on chemo Chemo related neuropathy following with Dr. Mark Sil CKD GERD Hypothyroidism Recent fall - MRI T spine Abnormal marrow edema involving the right transverse processes of T12 and L1, raising the possibility for acute and/or recent fractures.   Hospital day # 0  Jonette Nestle DNP, ACNPC-AG  [facility]  ATTENDING NOTE: I reviewed above note and agree with the assessment and plan. Pt was seen and examined.   No family at the bedside. She stated that yesterday she was having dinner with daughter and son in law, had short period speech difficulty, not making sense. With facial droop but not sure which side. MRI only incidental left cerebellar punctate infarct. Will continue eliquis  and change statin to crestor 20. PT and OT recommend HH.   For detailed assessment and plan, please refer to above as I have made changes wherever appropriate.   Neurology will sign off. Please call with questions. Pt will follow up with Dr. Mark Sil as outpt in about 4 weeks. Thanks for the consult.   Consuelo Denmark, MD PhD Stroke Neurology 04/01/2024 4:38 PM     To contact Stroke Continuity provider, please refer to [email or web address]. After hours, contact General Neurology

## 2024-04-01 NOTE — Hospital Course (Addendum)
 Patient is an 89 yo female who developed sudden onset of dysarthria and facial droop.  She was talking to her daughter during onset and symptoms lasted approximately 5 minutes then spontaneously resolved.  She initially went to [facility] and then transferred for further stroke workup. CT angio head/neck was overall unremarkable.  MRI brain pending. She has had an incidental fall recently and complaining of neck and upper back pain.  CT angio did mention T3 superior endplate height loss.    Neurologically she has no further deficits and symptoms appear consistent with probable TIA. Already on Eliquis  prior to admission and compliant. LDL 84. Takes pravastatin  40 mg daily at home.  A1c 5.1%. Has upcoming appointment with oncology on 04/21/2024 as well.  No obvious bleeding.  Thrombocytopenia noted.  Okay to continue Eliquis .  MRI T-spine obtained for further evaluation. Mild chronic compression deformities involving T3, L1, L3.  Chronic compression fractures of T8 and T12 with sequela of prior vertebral augmentation.  Abnormal marrow edema involving right transverse processes of T12 and L1 raising possibility of acute and/or recent fractures.   Dysarthria/facial droop TIA Presented with solitary episode of dysarthria and apparent facial droop.  Duration of symptoms 5 minutes and has not recurred Initial CTA of head and neck unremarkable in regards to explanation for symptoms; does mention some findings suggestive of fibromuscular dysplasia of bilateral cervical ICA MRI brain negative for acute CVA (potential left cerebellar signal abnormality would not explain dysarthria/droop on admission); does have chronic old CVAs noted in B/L cerebellum  Echocardiogram was completed March 2025 -LDL 84, A1c 5.1% - neuro rec's continue eliquis ; changed pravastatin  to crestor - needs ongoing HHPT, especially with some minor balance issues at baseline (potentially multifactorial from chronic compression fractures, age,  and old cerebellar strokes)   PAF/complete heart block/permanent pacemaker Continue Eliquis , amiodarone  Remains in A-fib with controlled ventricular response - follows with Dr. Berry [place] outpatient    Mechanical fall at home 1 week ago Pelvic/back pain MRI T-spine obtained for further evaluation. Mild chronic compression deformities involving T3, L1, L3.  Chronic compression fractures of T8 and T12 with sequela of prior vertebral augmentation.  Abnormal marrow edema involving right transverse processes of T12 and L1 raising possibility of acute and/or recent fractures. - continue supportive care - HHPT at discharge; no OT needs after eval   HFpEF Compensated without signs of heart failure - continue home regimen  Last echocardiogram March 2025:   IgG lambda multiple myeloma Last evaluated by heme/onc 6/2; was continued on Revlimid  with plans for follow-up in 4 weeks - upcoming appt 6/30   Thrombocytopenia -Some downtrend recently but tolerating Eliquis  and no bleeding - Repeat CBC at follow-up with hematology   CKD 3 Creatinine stable and at baseline   Dyslipidemia Continue statin   History of TAVR Subtle grade 1/6 systolic murmur   Hypothyroidism Continue Synthroid 

## 2024-04-01 NOTE — Discharge Summary (Signed)
 Physician Discharge Summary   Zoe [name] WUJ:[id number] DOB: May 27, 1934 DOA: 03/30/2024  PCP: Pete Brand, DO  Admit date: 03/30/2024 Discharge date: 04/01/2024  Admitted From: [facility] Disposition:  [facility] Discharging physician: Faith Homes, MD Barriers to discharge: none  Recommendations at discharge: Follow up with heme/onc as scheduled Follow up with cardiology as scheduled    Discharge Condition: stable CODE STATUS: Full  Diet recommendation:  Diet Orders (From admission, onward)     Start     Ordered   04/01/24 0000  Diet - low sodium heart healthy        04/01/24 1225   03/31/24 1238  Diet Heart Room service appropriate? Yes; Fluid consistency: Thin  Diet effective now       Question Answer Comment  Room service appropriate? Yes   Fluid consistency: Thin      03/31/24 1237            Hospital Course: Patient is an 89 yo female who developed sudden onset of dysarthria and facial droop.  She was talking to her daughter during onset and symptoms lasted approximately 5 minutes then spontaneously resolved.  She initially went to [facility] and then transferred for further stroke workup. CT angio head/neck was overall unremarkable.  MRI brain pending. She has had an incidental fall recently and complaining of neck and upper back pain.  CT angio did mention T3 superior endplate height loss.    Neurologically she has no further deficits and symptoms appear consistent with probable TIA. Already on Eliquis  prior to admission and compliant. LDL 84. Takes pravastatin  40 mg daily at home.  A1c 5.1%. Has upcoming appointment with oncology on 04/21/2024 as well.  No obvious bleeding.  Thrombocytopenia noted.  Okay to continue Eliquis .  MRI T-spine obtained for further evaluation. Mild chronic compression deformities involving T3, L1, L3.  Chronic compression fractures of T8 and T12 with sequela of prior vertebral augmentation.  Abnormal marrow edema involving  right transverse processes of T12 and L1 raising possibility of acute and/or recent fractures.   Dysarthria/facial droop TIA Presented with solitary episode of dysarthria and apparent facial droop.  Duration of symptoms 5 minutes and has not recurred Initial CTA of head and neck unremarkable in regards to explanation for symptoms; does mention some findings suggestive of fibromuscular dysplasia of bilateral cervical ICA MRI brain negative for acute CVA (potential left cerebellar signal abnormality would not explain dysarthria/droop on admission); does have chronic old CVAs noted in B/L cerebellum  Echocardiogram was completed March 2025 -LDL 84, A1c 5.1% - neuro rec's continue eliquis ; changed pravastatin  to crestor - needs ongoing HHPT, especially with some minor balance issues at baseline (potentially multifactorial from chronic compression fractures, age, and old cerebellar strokes)   PAF/complete heart block/permanent pacemaker Continue Eliquis , amiodarone  Remains in A-fib with controlled ventricular response - follows with Dr. Berry [place] outpatient    Mechanical fall at home 1 week ago Pelvic/back pain MRI T-spine obtained for further evaluation. Mild chronic compression deformities involving T3, L1, L3.  Chronic compression fractures of T8 and T12 with sequela of prior vertebral augmentation.  Abnormal marrow edema involving right transverse processes of T12 and L1 raising possibility of acute and/or recent fractures. - continue supportive care - HHPT at discharge; no OT needs after eval   HFpEF Compensated without signs of heart failure - continue home regimen  Last echocardiogram March 2025:   IgG lambda multiple myeloma Last evaluated by heme/onc 6/2; was continued on Revlimid  with plans for follow-up  in 4 weeks - upcoming appt 6/30   Thrombocytopenia -Some downtrend recently but tolerating Eliquis  and no bleeding - Repeat CBC at follow-up with hematology   CKD 3 Creatinine  stable and at baseline   Dyslipidemia Continue statin   History of TAVR Subtle grade 1/6 systolic murmur   Hypothyroidism Continue Synthroid    The patient's acute and chronic medical conditions were treated accordingly. On day of discharge, patient was felt deemed stable for discharge. Patient/family member advised to call PCP or come back to ER if needed.   Principal Diagnosis: Dysarthria  Discharge Diagnoses: Active Hospital Problems   Diagnosis Date Noted   Dysarthria 03/30/2024    Priority: 1.   Vertebral compression fracture (HCC) 04/01/2024    Priority: 2.   Chronic diastolic CHF (congestive heart failure) (HCC) 05/16/2022    Priority: 2.   History of stroke 04/16/2023    Priority: 7.   S/P TAVR (transcatheter aortic valve replacement) 09/03/2018    Priority: 7.   Multiple myeloma (HCC) 07/24/2022    Priority: 9.   Hypothyroidism     Priority: 10.   Chronic anticoagulation 10/02/2023   Atrial fib/flutter, transient (HCC) 04/16/2023   Chemotherapy-induced neuropathy (HCC) 11/28/2022    Resolved Hospital Problems  No resolved problems to display.     Discharge Instructions     Ambulatory referral to Occupational Therapy   Complete by: As directed    Ambulatory referral to Physical Therapy   Complete by: As directed    Diet - low sodium heart healthy   Complete by: As directed    Increase activity slowly   Complete by: As directed       Allergies as of 04/01/2024       Reactions   Quinolones Other (See Comments)   Patient on Amiodarone  and can prolong QT   Penicillins Other (See Comments)   UNSPECIFIED REACTION  Patient does not remember reaction.  Has patient had a PCN reaction causing immediate rash, facial/tongue/throat swelling, SOB or lightheadedness with hypotension: no Has patient had a PCN reaction causing severe rash involving mucus membranes or skin necrosis: no Has patient had a PCN reaction that required hospitalization no Has patient had  a PCN reaction occurring within the last 10 years: no If all of the above answers are "NO", then may proceed with Cephalosporin use.   Sulfa Antibiotics Other (See Comments)   UNSPECIFIED REACTION  "maybe vision issues? "        Medication List     STOP taking these medications    ALPRAZolam 0.25 MG tablet Commonly known as: XANAX   lidocaine  5 % Commonly known as: Lidoderm    pravastatin  40 MG tablet Commonly known as: PRAVACHOL    traMADol  50 MG tablet Commonly known as: ULTRAM        TAKE these medications    albuterol  108 (90 Base) MCG/ACT inhaler Commonly known as: VENTOLIN  HFA Inhale 2 puffs into the lungs every 6 (six) hours as needed for wheezing or shortness of breath.   amiodarone  100 MG tablet Commonly known as: PACERONE  Take 100 mg by mouth daily.   amLODipine  5 MG tablet Commonly known as: NORVASC  Take 5 mg by mouth daily.   apixaban  5 MG Tabs tablet Commonly known as: Eliquis  Take 1 tablet (5 mg total) by mouth 2 (two) times daily.   busPIRone  5 MG tablet Commonly known as: BUSPAR  Take 5 mg by mouth 2 (two) times daily.   [employer] VITAMIN B12 1000 MCG tablet Generic drug: cyanocobalamin   TAKE 1 TABLET BY MOUTH EVERY DAY   furosemide  20 MG tablet Commonly known as: LASIX  Three times a week as needed; take potassium if taking lasix  What changed:  how much to take how to take this when to take this reasons to take this additional instructions   lenalidomide  10 MG capsule Commonly known as: REVLIMID  Take 1 capsule (10 mg total) by mouth daily. Auth # [id number]  Date Obtained 03/10/24 Take 1 capsule daily for 21 days then none for 7 days What changed:  when to take this additional instructions   levothyroxine  100 MCG tablet Commonly known as: SYNTHROID  Take 100 mcg by mouth daily before breakfast.   losartan  50 MG tablet Commonly known as: COZAAR  Take 1 tablet (50 mg total) by mouth daily.   metoprolol  tartrate 50 MG tablet Commonly known  as: LOPRESSOR  Take 50 mg by mouth 2 (two) times daily.   pantoprazole  40 MG tablet Commonly known as: PROTONIX  Take 1 tablet (40 mg total) by mouth daily. What changed: when to take this   potassium chloride  SA 20 MEQ tablet Commonly known as: KLOR-CON  M Three times a week as needed only if taking lasix  What changed:  how much to take how to take this when to take this additional instructions   PreserVision AREDS Caps Take 1 capsule by mouth 2 (two) times daily.   rosuvastatin 20 MG tablet Commonly known as: CRESTOR Take 1 tablet (20 mg total) by mouth daily. Start taking on: April 02, 2024   Soothe XP Soln Place 1 drop into both eyes every evening.   Vitamin D3 50 MCG (2000 UT) capsule Take 1 capsule (2,000 Units total) by mouth daily.        Allergies  Allergen Reactions   Quinolones Other (See Comments)    Patient on Amiodarone  and can prolong QT   Penicillins Other (See Comments)    UNSPECIFIED REACTION  Patient does not remember reaction.  Has patient had a PCN reaction causing immediate rash, facial/tongue/throat swelling, SOB or lightheadedness with hypotension: no Has patient had a PCN reaction causing severe rash involving mucus membranes or skin necrosis: no Has patient had a PCN reaction that required hospitalization no Has patient had a PCN reaction occurring within the last 10 years: no If all of the above answers are "NO", then may proceed with Cephalosporin use.    Sulfa Antibiotics Other (See Comments)    UNSPECIFIED REACTION  "maybe vision issues? "    Consultations: Neurology  Procedures:   Discharge Exam: BP (!) 146/77 (BP Location: Right Arm)   Pulse 84   Temp 98.3 F (36.8 C)   Resp 19   SpO2 99%  Physical Exam Constitutional:      Appearance: Normal appearance.  HENT:     Head: Normocephalic and atraumatic.     Mouth/Throat:     Mouth: Mucous membranes are moist.  Eyes:     Extraocular Movements: Extraocular movements  intact.  Cardiovascular:     Rate and Rhythm: Normal rate and regular rhythm.  Pulmonary:     Effort: Pulmonary effort is normal. No respiratory distress.     Breath sounds: Normal breath sounds. No wheezing.  Abdominal:     General: Bowel sounds are normal. There is no distension.     Palpations: Abdomen is soft.     Tenderness: There is no abdominal tenderness.  Musculoskeletal:        General: Normal range of motion.     Cervical back: Normal  range of motion and neck supple.  Skin:    General: Skin is warm and dry.  Neurological:     General: No focal deficit present.     Mental Status: She is alert.  Psychiatric:        Mood and Affect: Mood normal.      The results of significant diagnostics from this hospitalization (including imaging, microbiology, ancillary and laboratory) are listed below for reference.   Microbiology: No results found for this or any previous visit (from the past 240 hours).   Labs: BNP (last 3 results) Recent Labs    04/13/23 0950  BNP 266.5*   Basic Metabolic Panel: Recent Labs  Lab 03/30/24 1415  NA 138  K 3.9  CL 100  CO2 24  GLUCOSE 94  BUN 31*  CREATININE 1.10*  CALCIUM  8.9   Liver Function Tests: Recent Labs  Lab 03/30/24 1415  AST 15  ALT 8  ALKPHOS 114  BILITOT 0.4  PROT 6.1*  ALBUMIN 3.9   No results for input(s): "LIPASE", "AMYLASE" in the last 168 hours. No results for input(s): "AMMONIA" in the last 168 hours. CBC: Recent Labs  Lab 03/30/24 1415  WBC 5.5  NEUTROABS 3.6  HGB 12.5  HCT 38.2  MCV 90.3  PLT 79*   Cardiac Enzymes: No results for input(s): "CKTOTAL", "CKMB", "CKMBINDEX", "TROPONINI" in the last 168 hours. BNP: Invalid input(s): "POCBNP" CBG: Recent Labs  Lab 03/30/24 1319  GLUCAP 94   D-Dimer No results for input(s): "DDIMER" in the last 72 hours. Hgb A1c Recent Labs    03/31/24 1305  HGBA1C 5.1   Lipid Profile Recent Labs    04/01/24 0433  CHOL 159  HDL 64  LDLCALC 84   TRIG 56  CHOLHDL 2.5   Thyroid  function studies No results for input(s): "TSH", "T4TOTAL", "T3FREE", "THYROIDAB" in the last 72 hours.  Invalid input(s): "FREET3" Anemia work up No results for input(s): "VITAMINB12", "FOLATE", "FERRITIN", "TIBC", "IRON", "RETICCTPCT" in the last 72 hours. Urinalysis    Component Value Date/Time   COLORURINE STRAW (A) 01/08/2024 1557   APPEARANCEUR CLEAR 01/08/2024 1557   LABSPEC 1.005 01/08/2024 1557   PHURINE 7.0 01/08/2024 1557   GLUCOSEU NEGATIVE 01/08/2024 1557   HGBUR NEGATIVE 01/08/2024 1557   BILIRUBINUR NEGATIVE 01/08/2024 1557   BILIRUBINUR negative 07/21/2023 1252   KETONESUR NEGATIVE 01/08/2024 1557   PROTEINUR NEGATIVE 01/08/2024 1557   UROBILINOGEN 0.2 07/21/2023 1252   NITRITE NEGATIVE 01/08/2024 1557   LEUKOCYTESUR NEGATIVE 01/08/2024 1557   Sepsis Labs Recent Labs  Lab 03/30/24 1415  WBC 5.5   Microbiology No results found for this or any previous visit (from the past 240 hours).  Procedures/Studies: MR THORACIC SPINE WO CONTRAST Result Date: 04/01/2024 CLINICAL DATA:  Initial evaluation for compression fracture. EXAM: MRI THORACIC SPINE WITHOUT CONTRAST TECHNIQUE: Multiplanar, multisequence MR imaging of the thoracic spine was performed. No intravenous contrast was administered. COMPARISON:  Comparison made with prior MRI from 10/08/2022. FINDINGS: Alignment: Mild exaggeration of the normal thoracic kyphosis. Trace facet mediated anterolisthesis of T2 on T3. Vertebrae: Chronic compression fractures of T8 and T12 with sequelae of prior vertebral augmentation. Additional mild chronic compression deformities involving the superior endplates of T3, L1, and L3 without significant bony retropulsion. Vertebral body height otherwise maintained without acute or recent fracture. Bone marrow signal intensity within normal limits. No worrisome osseous lesions. Schmorl's node deformity with mild marrow edema within the partially visualized  L3 vertebral body noted. Reactive marrow edema present about  the T2-3 facets bilaterally due to facet arthritis. Abnormal marrow edema seen involving the right transverse processes of T12 and L1, raising the possibility for possible acute and/or recent fractures (series 13, image 5). Cord:  Normal signal and morphology. Paraspinal and other soft tissues: Paraspinous soft tissues within normal limits. Atherosclerotic change noted about the visualized aorta. Disc levels: T1-2: Mild disc bulge. Mild bilateral facet hypertrophy. No spinal stenosis. Mild left with moderate right foraminal stenosis. T2-3: Trace anterolisthesis. Minimal disc bulge. Right worse than left facet hypertrophy with associated reactive marrow edema. No spinal stenosis. Mild bilateral foraminal narrowing. T3-4: Negative interspace. Right worse than left facet hypertrophy. No spinal stenosis. Mild bilateral foraminal narrowing. T4-5: Degenerative intervertebral disc space narrowing, with mild disc bulge and endplate spurring. Small central disc protrusion minimally indents the ventral thecal sac (series 15, image 15). Minimal cord flattening without cord signal changes. Mild posterior element hypertrophy. No spinal stenosis. Mild bilateral foraminal narrowing. T5-6: Degenerative intervertebral disc space narrowing with mild disc bulge and reactive endplate spurring. Mild facet degeneration. No spinal stenosis. Mild bilateral foraminal narrowing. T6-7: Degenerative intervertebral disc space narrowing. Right paracentral disc protrusion indents the right ventral thecal sac (series 15, image 23). Mild endplate spurring. No spinal stenosis. Foramina remain patent. T7-8: Disc desiccation without significant disc bulge. Endplate spurring. Mild facet degeneration. No spinal stenosis. Mild bilateral foraminal stenosis. T8-9: Mild disc bulge. Superimposed right paracentral disc protrusion flattens the right ventral thecal sac (series 16, image 31). Mild  facet hypertrophy. No significant spinal stenosis. Mild bilateral foraminal narrowing. T9-10: Diffuse disc bulge with reactive endplate spurring. Superimposed right paracentral to foraminal disc protrusion. Mild bilateral facet hypertrophy. No significant spinal stenosis. Mild to moderate right greater than left foraminal stenosis. T10-11: Minimal annular disc bulge. Mild facet hypertrophy. No spinal stenosis. Mild bilateral foraminal narrowing. T11-12: Mild disc bulge. Mild bilateral facet hypertrophy. No spinal stenosis. Foramina remain patent. T12-L1: Degenerative intervertebral disc space narrowing with diffuse disc bulge. Reactive endplate spurring. Mild facet hypertrophy. No spinal stenosis. Foramina remain patent. IMPRESSION: 1. Abnormal marrow edema involving the right transverse processes of T12 and L1, raising the possibility for acute and/or recent fractures. Correlation with physical exam for pain at these locations recommended. Further evaluation with dedicated CT of the lumbar spine could be performed for confirmatory purposes as warranted. 2. No other acute or recent compression fracture within the thoracic spine. 3. Chronic compression fractures of T8 and T12 with sequelae of prior vertebral augmentation. Additional mild chronic compression deformities involving the superior endplates of T3, L1, and L3 without significant retropulsion. 4. Multilevel thoracic spondylosis without significant spinal stenosis. Mild to moderate multilevel foraminal narrowing as above. 5. Reactive marrow edema about the T2-3 facets bilaterally due to facet arthritis. Finding could serve as a source for back pain. Electronically Signed   By: Virgia Griffins M.D.   On: 04/01/2024 05:20   MR BRAIN WO CONTRAST Result Date: 04/01/2024 CLINICAL DATA:  Initial evaluation for acute neuro deficit, stroke suspected. EXAM: MRI HEAD WITHOUT CONTRAST TECHNIQUE: Multiplanar, multiecho pulse sequences of the brain and surrounding  structures were obtained without intravenous contrast. COMPARISON:  CT from 03/30/2024 FINDINGS: Brain: Diffuse prominence of the CSF containing spaces compatible generalized cerebral atrophy. Patchy T2/FLAIR hyperintensity involving the periventricular deep white matter both cerebral hemispheres, consistent with chronic small vessel ischemic disease, mild to moderate in nature. Few small remote cerebellar infarcts noted. Single possible punctate focus of diffusion signal abnormality noted involving the left cerebellum (series 2, image 14).  This is not well seen on corresponding sequences. While this finding could be artifactual nature, a possible small acute ischemic nonhemorrhagic infarct is difficult to exclude, and could be considered in the correct clinical setting. No other evidence for acute or subacute ischemia. Gray-white matter differentiation otherwise maintained. No acute or chronic intracranial blood products. No mass lesion, midline shift or mass effect. No hydrocephalus or extra-axial fluid collection. Pituitary gland within normal limits. Vascular: Major intracranial vascular flow voids are maintained. Skull and upper cervical spine: Craniocervical junction within normal limits. Bone marrow signal intensity normal. No scalp soft tissue abnormality. Sinuses/Orbits: Prior bilateral ocular lens replacement. Changes of chronic sphenoid ethmoidal sinus disease noted. No significant mastoid effusion. Other: None. IMPRESSION: 1. Single apparent punctate focus of diffusion signal abnormality involving the left cerebellum as above. While this finding could potentially be artifactual nature, a possible small acute ischemic nonhemorrhagic infarct is difficult to exclude, and could be considered in the correct clinical setting. 2. No other acute intracranial abnormality. 3. Generalized cerebral atrophy with mild to moderate chronic small vessel ischemic disease, with a few small remote cerebellar infarcts.  Electronically Signed   By: Virgia Griffins M.D.   On: 04/01/2024 04:31   DG Pelvis 1-2 Views Result Date: 03/31/2024 CLINICAL DATA:  [id number] Pelvic pain [id number] EXAM: PELVIS - 1-2 VIEW COMPARISON:  X-ray pelvis 01/08/2024 FINDINGS: There is no evidence of pelvic fracture or diastasis. No acute displaced fracture or dislocation of either hips. No pelvic bone lesions are seen. Atherosclerotic plaque. IMPRESSION: Negative for acute traumatic injury. Electronically Signed   By: Morgane  Naveau M.D.   On: 03/31/2024 17:34   CT ANGIO HEAD NECK W WO CM Result Date: 03/30/2024 CLINICAL DATA:  Neuro deficit with concern for stroke. Left-sided facial droop and slurred speech starting 2.5-3 hours ago. EXAM: CT ANGIOGRAPHY HEAD AND NECK WITH AND WITHOUT CONTRAST TECHNIQUE: Multidetector CT imaging of the head and neck was performed using the standard protocol during bolus administration of intravenous contrast. Multiplanar CT image reconstructions and MIPs were obtained to evaluate the vascular anatomy. Carotid stenosis measurements (when applicable) are obtained utilizing NASCET criteria, using the distal internal carotid diameter as the denominator. RADIATION DOSE REDUCTION: This exam was performed according to the departmental dose-optimization program which includes automated exposure control, adjustment of the mA and/or kV according to patient size and/or use of iterative reconstruction technique. CONTRAST:  75mL OMNIPAQUE  IOHEXOL  350 MG/ML SOLN COMPARISON:  MRI head 03/13/2023. FINDINGS: CT HEAD FINDINGS Brain: No acute intracranial hemorrhage. No CT evidence of acute infarct. Nonspecific hypoattenuation in the periventricular and subcortical white matter favored to reflect chronic microvascular ischemic changes. 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 No edema, mass effect, or midline shift. The basilar cisterns are patent. Ventricles: Ventricles are normal in size and configuration. Vascular: No hyperdense  vessel. Intracranial atherosclerotic calcifications noted. Skull: No acute or aggressive finding. Sinuses/orbits: Orbits are symmetric. Postsurgical changes of the maxillary and ethmoid sinuses. Mild mucosal thickening in the left sphenoid sinus as well as the ethmoid sinuses. Possible mucous retention cyst in the left maxillary sinus. Other: Mastoid air cells are clear. CTA NECK FINDINGS Aortic arch: Standard configuration of the aortic arch. Imaged portion shows no evidence of aneurysm or dissection. Mild atherosclerosis. Pulmonary arteries: As permitted by contrast timing, there are no filling defects in the visualized pulmonary arteries. Subclavian arteries: Patent bilaterally. Moderate stenosis at the origin of the left subclavian artery. Right carotid system: Patent. Mild atherosclerosis at the carotid bifurcation without hemodynamically significant  stenosis. No evidence of dissection. Along the mid and distal cervical ICA there is mild irregularity with areas of narrowing and subtle outpouching suggestive of fibromuscular dysplasia. Left carotid system: Patent. Bulky calcified atherosclerosis at the carotid bifurcation without evidence of hemodynamically significant stenosis. No evidence of dissection. There is mild irregularity along the mid cervical ICA with subtle areas of narrowing and outpouching. Vertebral arteries: Codominant. No evidence of dissection, stenosis (50% or greater), or occlusion. Mild atherosclerosis of the distal right V1 segment without significant stenosis. Skeleton: No acute findings. Degenerative changes in the cervical spine. Irregularity of the T3 vertebral body with mild height loss anteriorly. Other neck: The visualized airway is patent. No cervical lymphadenopathy. The left thyroid  lobe is not visualized and may be hypoplastic versus surgically absent. Upper chest: No acute abnormality in the lung apices. Partially visualized left chest wall pacer device. Review of the MIP  images confirms the above findings CTA HEAD FINDINGS ANTERIOR CIRCULATION: The intracranial ICAs are patent bilaterally. Atherosclerosis of the bilateral carotid siphons. There is mild stenosis of both cavernous ICAs. Additional focal moderate stenosis of the left supraclinoid ICA. No proximal occlusion, aneurysm, or vascular malformation. MCAs: The middle cerebral arteries are patent bilaterally. ACAs: The anterior cerebral arteries are patent bilaterally. POSTERIOR CIRCULATION: No significant stenosis, proximal occlusion, aneurysm, or vascular malformation. PCAs: The posterior cerebral arteries are patent bilaterally. Pcomm: The posterior communicating arteries are visualized bilaterally. SCAs: The superior cerebellar arteries are patent bilaterally. Basilar artery: Patent AICAs: Patent PICAs: Patent Vertebral arteries: The intracranial vertebral arteries are patent. Venous sinuses: As permitted by contrast timing, patent. Anatomic variants: None Review of the MIP images confirms the above findings IMPRESSION: No large vessel occlusion. No CT evidence of acute intracranial abnormality. No severe stenosis, aneurysm, or dissection of the arteries in the head and neck. Moderate stenosis of the left supraclinoid ICA. Moderate stenosis at the origin of the left subclavian artery. Findings suggestive of fibromuscular dysplasia within the mid and distal right cervical ICA and mid left cervical ICA. 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 Chronic microvascular ischemic changes. Paranasal sinus disease as above with layering secretions in the left sphenoid sinus. Recommend clinical correlation for acute sinusitis. Irregularity of the T3 superior endplate with mild height loss anteriorly. Recommend correlation with history of trauma and tenderness at this level. Consider MRI of the thoracic spine for further evaluation. Aortic Atherosclerosis (ICD10-I70.0). Electronically Signed   By: Denny Flack M.D.   On: 03/30/2024  16:04   VAS US  CAROTID Result Date: 03/20/2024 Carotid Arterial Duplex Study Patient Name:  MAUDIE SHINGLEDECKER  Date of Exam:   03/20/2024 Medical Rec #: [id number]            Accession #:    [id number] Date of Birth: 1934-02-13             Patient Gender: F Patient Age:   54 years Exam Location:  [address] Procedure:      VAS US  CAROTID Referring Phys: Knox Perl --------------------------------------------------------------------------------  Indications:       Carotid artery disease and Patient denies any cerebrovascular                    symptoms. Risk Factors:      Hypertension, hyperlipidemia, no history of smoking, prior                    MI, prior CVA. Other Factors:     History of TAVR  History of CKD stage 3. Comparison Study:  Prior carotid duplex exam on 09/21/2022 (outside) right 1-15                    %, left 16-49%. Performing Technologist: Richarda Chance RVT, RDCS (AE), RDMS  Examination Guidelines: A complete evaluation includes B-mode imaging, spectral Doppler, color Doppler, and power Doppler as needed of all accessible portions of each vessel. Bilateral testing is considered an integral part of a complete examination. Limited examinations for reoccurring indications may be performed as noted.  Right Carotid Findings: +----------+--------+--------+--------+------------------+------------------+           PSV cm/sEDV cm/sStenosisPlaque DescriptionComments           +----------+--------+--------+--------+------------------+------------------+ CCA Prox  148     14                                                   +----------+--------+--------+--------+------------------+------------------+ CCA Mid   65      11              focal and calcificintimal thickening +----------+--------+--------+--------+------------------+------------------+ CCA Distal54      14              diffuse           intimal thickening  +----------+--------+--------+--------+------------------+------------------+ ICA Prox  58      14              focal and calcific                   +----------+--------+--------+--------+------------------+------------------+ ICA Mid   72      20      1-39%                                        +----------+--------+--------+--------+------------------+------------------+ ICA Distal54      16                                tortuous           +----------+--------+--------+--------+------------------+------------------+ ECA       77      11              focal and calcific                   +----------+--------+--------+--------+------------------+------------------+ +----------+--------+-------+----------------+-------------------+           PSV cm/sEDV cmsDescribe        Arm Pressure (mmHG) +----------+--------+-------+----------------+-------------------+ [id number]            Multiphasic, [id number]                 +----------+--------+-------+----------------+-------------------+ +---------+--------+--+--------+--+---------+ VertebralPSV cm/s80EDV cm/s17Antegrade +---------+--------+--+--------+--+---------+  Left Carotid Findings: +----------+--------+--------+--------+------------------+------------------+           PSV cm/sEDV cm/sStenosisPlaque DescriptionComments           +----------+--------+--------+--------+------------------+------------------+ CCA Prox  66      11                                                   +----------+--------+--------+--------+------------------+------------------+  CCA Mid   71      11                                                   +----------+--------+--------+--------+------------------+------------------+ CCA Distal51      9               focal and calcificintimal thickening +----------+--------+--------+--------+------------------+------------------+ ICA Prox  75      15      1-39%   focal and  calcificShadowing          +----------+--------+--------+--------+------------------+------------------+ ICA Mid   67      18                                                   +----------+--------+--------+--------+------------------+------------------+ ICA Distal56      17                                tortuous           +----------+--------+--------+--------+------------------+------------------+ ECA       101     15              focal and calcificshadowing          +----------+--------+--------+--------+------------------+------------------+ +----------+--------+--------+----------------+-------------------+           PSV cm/sEDV cm/sDescribe        Arm Pressure (mmHG) +----------+--------+--------+----------------+-------------------+ [id number]             Multiphasic, [id number]                 +----------+--------+--------+----------------+-------------------+ +---------+--------+--+--------+--+---------+ VertebralPSV cm/s33EDV cm/s10Antegrade +---------+--------+--+--------+--+---------+   Summary: Right Carotid: Velocities in the right ICA are consistent with a 1-39% stenosis. Left Carotid: Velocities in the left ICA are consistent with a 1-39% stenosis. Vertebrals:  Bilateral vertebral arteries demonstrate antegrade flow. Subclavians: Normal flow hemodynamics were seen in bilateral subclavian              arteries. *See table(s) above for measurements and observations. Suggest follow up study in 12 months. Electronically signed by Janelle Mediate MD on 03/20/2024 at 12:08:49 PM.    Final      Time coordinating discharge: Over 30 minutes    Faith Homes, MD  [facility] 04/01/2024, 12:25 PM

## 2024-04-01 NOTE — Progress Notes (Signed)
 DISCHARGE NOTE HOME Zoe  B [name] to be discharged Home per MD order. Discussed prescriptions and follow up appointments with the patient. Prescriptions given to patient; medication list explained in detail. Patient verbalized understanding.  Skin clean, dry and intact without evidence of skin break down, no evidence of skin tears noted. IV catheter discontinued intact. Site without signs and symptoms of complications. Dressing and pressure applied. Pt denies pain at the site currently. No complaints noted.  Patient free of lines, drains, and wounds.   An After Visit Summary (AVS) was printed and given to the patient. Patient escorted via wheelchair, and discharged home via private auto.  Azelie Noguera K Wendee Hata, RN

## 2024-04-02 NOTE — CV Procedure (Signed)
  Device system confirmed to be MRI conditional, with implant date > 6 weeks ago, and no evidence of abandoned or epicardial leads in review of most recent CXR  Device last cleared by EP Provider: Suzaan Riddle 03/31/24  Clearance is good through for 1 year as long as parameters remain stable at time of check. If pt undergoes a cardiac device procedure during that time, they should be re-cleared.   Tachy-therapies to be programmed off if applicable with device back to pre-MRI settings after completion of exam.  [employer] - Programming recommendation received through [employer] App/Tablet  Arlys Lamer, RT  04/02/2024 9:03 AM

## 2024-04-10 ENCOUNTER — Other Ambulatory Visit: Payer: Self-pay | Admitting: *Deleted

## 2024-04-10 MED ORDER — LENALIDOMIDE 5 MG PO CAPS: 5.0000 mg | ORAL_CAPSULE | Freq: Every day | ORAL | 0 refills | Status: DC

## 2024-04-11 NOTE — Progress Notes (Signed)
 Remote pacemaker transmission.

## 2024-04-12 DIAGNOSIS — M62541 Muscle wasting and atrophy, not elsewhere classified, right hand: Secondary | ICD-10-CM | POA: Diagnosis not present

## 2024-04-12 DIAGNOSIS — R2681 Unsteadiness on feet: Secondary | ICD-10-CM | POA: Diagnosis not present

## 2024-04-12 DIAGNOSIS — M62542 Muscle wasting and atrophy, not elsewhere classified, left hand: Secondary | ICD-10-CM | POA: Diagnosis not present

## 2024-04-14 DIAGNOSIS — R296 Repeated falls: Secondary | ICD-10-CM | POA: Diagnosis not present

## 2024-04-14 DIAGNOSIS — R2681 Unsteadiness on feet: Secondary | ICD-10-CM | POA: Diagnosis not present

## 2024-04-14 DIAGNOSIS — M62562 Muscle wasting and atrophy, not elsewhere classified, left lower leg: Secondary | ICD-10-CM | POA: Diagnosis not present

## 2024-04-14 DIAGNOSIS — M62561 Muscle wasting and atrophy, not elsewhere classified, right lower leg: Secondary | ICD-10-CM | POA: Diagnosis not present

## 2024-04-21 ENCOUNTER — Inpatient Hospital Stay

## 2024-04-21 ENCOUNTER — Inpatient Hospital Stay: Admitting: Hematology and Oncology

## 2024-04-21 VITALS — BP 135/61 | HR 60 | Temp 98.2°F | Resp 13 | Wt 167.8 lb

## 2024-04-21 DIAGNOSIS — C9000 Multiple myeloma not having achieved remission: Secondary | ICD-10-CM

## 2024-04-21 DIAGNOSIS — D649 Anemia, unspecified: Secondary | ICD-10-CM

## 2024-04-21 DIAGNOSIS — I10 Essential (primary) hypertension: Secondary | ICD-10-CM | POA: Diagnosis not present

## 2024-04-21 DIAGNOSIS — Z7901 Long term (current) use of anticoagulants: Secondary | ICD-10-CM | POA: Diagnosis not present

## 2024-04-21 DIAGNOSIS — D696 Thrombocytopenia, unspecified: Secondary | ICD-10-CM

## 2024-04-21 DIAGNOSIS — I4891 Unspecified atrial fibrillation: Secondary | ICD-10-CM | POA: Diagnosis not present

## 2024-04-21 DIAGNOSIS — E538 Deficiency of other specified B group vitamins: Secondary | ICD-10-CM | POA: Diagnosis not present

## 2024-04-21 LAB — CBC WITH DIFFERENTIAL (CANCER CENTER ONLY)
Abs Immature Granulocytes: 0.01 10*3/uL (ref 0.00–0.07)
Basophils Absolute: 0.1 10*3/uL (ref 0.0–0.1)
Basophils Relative: 1 %
Eosinophils Absolute: 0.1 10*3/uL (ref 0.0–0.5)
Eosinophils Relative: 1 %
HCT: 36.8 % (ref 36.0–46.0)
Hemoglobin: 12.2 g/dL (ref 12.0–15.0)
Immature Granulocytes: 0 %
Lymphocytes Relative: 21 %
Lymphs Abs: 1.1 10*3/uL (ref 0.7–4.0)
MCH: 29.2 pg (ref 26.0–34.0)
MCHC: 33.2 g/dL (ref 30.0–36.0)
MCV: 88 fL (ref 80.0–100.0)
Monocytes Absolute: 1 10*3/uL (ref 0.1–1.0)
Monocytes Relative: 18 %
Neutro Abs: 3.3 10*3/uL (ref 1.7–7.7)
Neutrophils Relative %: 59 %
Platelet Count: 141 10*3/uL — ABNORMAL LOW (ref 150–400)
RBC: 4.18 MIL/uL (ref 3.87–5.11)
RDW: 16.7 % — ABNORMAL HIGH (ref 11.5–15.5)
WBC Count: 5.5 10*3/uL (ref 4.0–10.5)
nRBC: 0 % (ref 0.0–0.2)

## 2024-04-21 LAB — CMP (CANCER CENTER ONLY)
ALT: 12 U/L (ref 0–44)
AST: 14 U/L — ABNORMAL LOW (ref 15–41)
Albumin: 3.7 g/dL (ref 3.5–5.0)
Alkaline Phosphatase: 95 U/L (ref 38–126)
Anion gap: 8 (ref 5–15)
BUN: 32 mg/dL — ABNORMAL HIGH (ref 8–23)
CO2: 26 mmol/L (ref 22–32)
Calcium: 8.7 mg/dL — ABNORMAL LOW (ref 8.9–10.3)
Chloride: 103 mmol/L (ref 98–111)
Creatinine: 1.14 mg/dL — ABNORMAL HIGH (ref 0.44–1.00)
GFR, Estimated: 46 mL/min — ABNORMAL LOW (ref >60)
Glucose, Bld: 110 mg/dL — ABNORMAL HIGH (ref 70–99)
Potassium: 4.3 mmol/L (ref 3.5–5.1)
Sodium: 137 mmol/L (ref 135–145)
Total Bilirubin: 0.4 mg/dL (ref 0.0–1.2)
Total Protein: 6.5 g/dL (ref 6.5–8.1)

## 2024-04-21 LAB — LACTATE DEHYDROGENASE: LDH: 216 U/L — ABNORMAL HIGH (ref 98–192)

## 2024-04-22 ENCOUNTER — Telehealth: Payer: Self-pay | Admitting: Hematology and Oncology

## 2024-04-22 DIAGNOSIS — M62541 Muscle wasting and atrophy, not elsewhere classified, right hand: Secondary | ICD-10-CM | POA: Diagnosis not present

## 2024-04-22 DIAGNOSIS — R2681 Unsteadiness on feet: Secondary | ICD-10-CM | POA: Diagnosis not present

## 2024-04-22 DIAGNOSIS — M62542 Muscle wasting and atrophy, not elsewhere classified, left hand: Secondary | ICD-10-CM | POA: Diagnosis not present

## 2024-04-22 LAB — KAPPA/LAMBDA LIGHT CHAINS
Kappa free light chain: 37.9 mg/L — ABNORMAL HIGH (ref 3.3–19.4)
Kappa, lambda light chain ratio: 1.39 (ref 0.26–1.65)
Lambda free light chains: 27.3 mg/L — ABNORMAL HIGH (ref 5.7–26.3)

## 2024-04-22 NOTE — Progress Notes (Signed)
 [facility] [phone]   [phone]   PROGRESS NOTE   Patient Care Team: [facility] as PCP - General (Rheumatology)   Hematological/Oncological History # IgG Lambda Multiple Myeloma  06/14/2022: establish care with [facility] due to anemia. Labs showed M protein 4.9, Kappa 7.2, Lambda 10.8, ratio 0.67 07/13/2022: Bmbx showed Lambda restricted plasma cell neoplasm involving approximately 60% of the cellular marrow by IHC on the biopsy.  08/01/2022: Cycle 1 Day 1 of VRd chemotherapy. (Holding revlimid  initially) 08/21/2022:  Cycle 2 Day 1 of VRd chemotherapy. (Holding revlimid ) 09/04/2022: Cycle 3 Day 1 of VRd chemotherapy. Started revlimid  10/03/2022: Cycle 4 Day 1 of VRd chemotherapy 10/31/2022: Cycle 5 Day 1 of VRd chemotherapy 11/20/2022: Cycle 6 Day 1 of VRd chemotherapy 12/11/2022: Cycle 7 Day 1 of VRd chemotherapy 01/02/2023: Cycle 8 Day 1 of VRd chemotherapy 01/22/2023: Cycle 9 Day 1 of VRd chemotherapy 02/23/2023: Day 1 Cycle 1 of Dara/Rev/Dex 03/23/2023: Day 1 Cycle 2 of Dara/Rev/Dex 05/06/2023: chemotherapy on pause due to chest pain/cardiac/respiratory issues.  05/18/2023: Cycle 3 Day 1 of Dara/Rev/Dex 06/15/2023: Cycle 4 Day 1 of Dara/Rev/Dex 07/13/2023: Cycle 5 Day 1 of Dara/Rev/Dex 08/10/2023: Cycle 6 Day 1 of Dara/Rev/Dex. Transition to maintenance revlimid .   INTERVAL HISTORY: Zoe [name] 89 y.o. female returns to the clinic today for a follow-up visit accompanied by her daughter-in-law.   Her last visit was on 02/28/2024. In the interim, she has had no major changes in her health.   Zoe [name] reports she has been well overall in the interim since her last visit.  She is doing her best to stay hydrated and stay cool.  She reports that she has been fairly active.  She notes that she has a good appetite and is eating ice cream at her facility at night.  She reports her weight has been increasing.  She is delighted that  her current facility at [facility] does provide her with her nightly meals.  She notes that her energy today is about a 5 or 6 out of 10.  She reports sometimes she has to take a nap as she is tired after lunch.  Otherwise she has had no changes in her health in the interim since our last discussion.  Previously we discussed continuing 10 mg of revlimid , dropping to 5 mg available, or discontinuing the medication altogether.  Patient notes that she would like to restart at full dose.  She would be agreeable to dropping if she is having the symptoms.  She voiced understanding of our findings and plan moving forward.  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  MEDICATIONS:  Current Outpatient Medications  Medication Sig Dispense Refill   albuterol  (VENTOLIN  HFA)  108 (90 Base) MCG/ACT inhaler Inhale 2 puffs into the lungs every 6 (six) hours as needed for wheezing or shortness of breath.     amiodarone  (PACERONE ) 100 MG tablet Take 100 mg by mouth daily.     amLODipine  (NORVASC ) 5 MG tablet Take 5 mg by mouth daily.     apixaban  (ELIQUIS ) 5 MG TABS tablet Take 1 tablet (5 mg total) by mouth 2 (two) times daily. 180 tablet 1   Artificial Tear Solution (SOOTHE XP) SOLN Place 1 drop into both eyes every evening.     busPIRone  (BUSPAR ) 5 MG tablet Take 5 mg by mouth 2  (two) times daily.     Cholecalciferol  (VITAMIN D3) 50 MCG (2000 UT) capsule Take 1 capsule (2,000 Units total) by mouth daily.     [employer] VITAMIN B12 1000 MCG tablet TAKE 1 TABLET BY MOUTH EVERY DAY 90 tablet 1   furosemide  (LASIX ) 20 MG tablet Three times a week as needed; take potassium if taking lasix      lenalidomide  (REVLIMID ) 5 MG capsule Take 1 capsule (5 mg total) by mouth daily. [employer] Auth # [id number]     Date Obtained 04/10/24  Take 1 daily for 21 days then none for 7 days 21 capsule 0   levothyroxine  (SYNTHROID , LEVOTHROID) 100 MCG tablet Take 100 mcg by mouth daily before breakfast.  2   losartan  (COZAAR ) 50 MG tablet Take 1 tablet (50 mg total) by mouth daily.     metoprolol  tartrate (LOPRESSOR ) 50 MG tablet Take 50 mg by mouth 2 (two) times daily.     Multiple Vitamins-Minerals (PRESERVISION AREDS) CAPS Take 1 capsule by mouth 2 (two) times daily.     pantoprazole  (PROTONIX ) 40 MG tablet Take 1 tablet (40 mg total) by mouth daily.     potassium chloride  SA (KLOR-CON  M) 20 MEQ tablet Three times a week as needed only if taking lasix      rosuvastatin  (CRESTOR ) 20 MG tablet Take 1 tablet (20 mg total) by mouth daily. 90 tablet 3   No current facility-administered medications for this visit.    SURGICAL HISTORY:  Past Surgical History:  Procedure Laterality Date   ABDOMINAL HYSTERECTOMY  1970's   BACK SURGERY     BREAST LUMPECTOMY  12/1998   lumpectomy   CARDIAC CATHETERIZATION     CARDIOVERSION N/A 04/18/2023   Procedure: CARDIOVERSION;  Surgeon: Michele Richardson, DO;  Location: [facility];  Service: Cardiovascular;  Laterality: N/A;   CARDIOVERSION N/A 04/20/2023   Procedure: CARDIOVERSION;  Surgeon: Ladona Heinz, MD;  Location: [facility];  Service: Cardiovascular;  Laterality: N/A;   EYE SURGERY     cataract surgery bilat    INTRAOPERATIVE TRANSTHORACIC ECHOCARDIOGRAM N/A 09/03/2018   Procedure: INTRAOPERATIVE TRANSTHORACIC ECHOCARDIOGRAM;  Surgeon: Verlin Lonni BIRCH, MD;  Location: [facility] OR;  Service: Open Heart Surgery;  Laterality: N/A;   KYPHOPLASTY N/A 09/07/2022   Procedure: THORACIC EIGHT KYPHOPLASTY;  Surgeon: Burnetta Aures, MD;  Location: [facility] OR;  Service: Orthopedics;  Laterality: N/A;  1 hr Local with [facility] 3 C-Bed   LITHOTRIPSY     PACEMAKER IMPLANT N/A 01/09/2024   Procedure: PACEMAKER IMPLANT;  Surgeon: Waddell Danelle ORN, MD;  Location: [facility];  Service: Cardiovascular;  Laterality: N/A;   Right total knee     2018 Dr. Ernie   RIGHT/LEFT HEART CATH AND CORONARY ANGIOGRAPHY N/A 08/06/2018   Procedure: RIGHT/LEFT HEART CATH AND CORONARY ANGIOGRAPHY;  Surgeon: Ladona Heinz, MD;  Location:  [facility];  Service: Cardiovascular;  Laterality: N/A;   THYROIDECTOMY, PARTIAL  1975   TONSILLECTOMY     as a child - patient not sure of exact date   TOTAL KNEE ARTHROPLASTY Left 03/13/2016   Procedure: TOTAL KNEE ARTHROPLASTY;  Surgeon: Donnice Car, MD;  Location: [facility] ORS;  Service: Orthopedics;  Laterality: Left;   TOTAL KNEE ARTHROPLASTY Right 06/18/2017   Procedure: RIGHT TOTAL KNEE ARTHROPLASTY;  Surgeon: Car Donnice, MD;  Location: [facility] ORS;  Service: Orthopedics;  Laterality: Right;   TRANSCATHETER AORTIC VALVE REPLACEMENT, TRANSFEMORAL N/A 09/03/2018   Procedure: TRANSCATHETER AORTIC VALVE REPLACEMENT, TRANSFEMORAL;  Surgeon: Verlin Lonni BIRCH, MD;  Location: [facility] OR;  Service: Open Heart Surgery;  Laterality: N/A;   REVIEW OF SYSTEMS:   Constitutional: ( - ) fevers, ( - )  chills , ( - ) night sweats Eyes: ( - ) blurriness of vision, ( - ) double vision, ( - ) watery eyes Ears, nose, mouth, throat, and face: ( - ) mucositis, ( - ) sore throat Respiratory: ( -) cough, ( - ) dyspnea, ( - ) wheezes Cardiovascular: ( - ) palpitation, ( - ) chest discomfort, ( +) lower extremity swelling Gastrointestinal:  ( - ) nausea, ( - ) heartburn, ( - ) change in bowel habits Skin: ( - ) abnormal skin rashes Lymphatics: (  - ) new lymphadenopathy, ( - ) easy bruising Neurological: ( - ) numbness, ( - ) tingling, ( - ) new weaknesses Behavioral/Psych: ( - ) mood change, ( - ) new changes  All other systems were reviewed with the patient and are negative.   PHYSICAL EXAMINATION:  Blood pressure 135/61, pulse 60, temperature 98.2 F (36.8 C), temperature source Temporal, resp. rate 13, weight 167 lb 12.8 oz (76.1 kg), SpO2 98%.  ECOG PERFORMANCE STATUS: 1  GENERAL: well appearing elderly Caucasian female, alert, no distress and comfortable SKIN: skin color, texture, turgor are normal, no rashes or significant lesions EYES: conjunctiva are pink and non-injected, sclera clear LUNGS:normal breathing effort.  HEART: irregular rhythm ,normal rate and no murmurs.  Musculoskeletal: no cyanosis of digits and no clubbing  PSYCH: alert & oriented x 3, fluent speech NEURO: no focal motor/sensory deficits    LABORATORY DATA: Lab Results  Component Value Date   WBC 5.5 04/21/2024   HGB 12.2 04/21/2024   HCT 36.8 04/21/2024   MCV 88.0 04/21/2024   PLT 141 (L) 04/21/2024      Chemistry      Component Value Date/Time   NA 137 04/21/2024 1450   NA 130 (L) 06/19/2022 1012   K 4.3 04/21/2024 1450   CL 103 04/21/2024 1450   CO2 26 04/21/2024 1450   BUN 32 (H) 04/21/2024 1450   BUN 21 06/19/2022 1012   CREATININE 1.14 (H) 04/21/2024 1450      Component Value Date/Time   CALCIUM  8.7 (L) 04/21/2024 1450   ALKPHOS 95 04/21/2024 1450   AST 14 (L) 04/21/2024 1450   ALT 12 04/21/2024 1450   BILITOT 0.4 04/21/2024 1450       RADIOGRAPHIC STUDIES:  MR THORACIC SPINE WO CONTRAST Result Date: 04/01/2024 CLINICAL DATA:  Initial evaluation for compression fracture. EXAM: MRI THORACIC SPINE WITHOUT CONTRAST TECHNIQUE: Multiplanar, multisequence MR imaging of the thoracic spine was performed. No intravenous contrast was administered. COMPARISON:  Comparison made with prior MRI from 10/08/2022. FINDINGS: Alignment:  Mild exaggeration of the normal thoracic kyphosis. Trace facet mediated anterolisthesis of T2 on T3. Vertebrae: Chronic  compression fractures of T8 and T12 with sequelae of prior vertebral augmentation. Additional mild chronic compression deformities involving the superior endplates of T3, L1, and L3 without significant bony retropulsion. Vertebral body height otherwise maintained without acute or recent fracture. Bone marrow signal intensity within normal limits. No worrisome osseous lesions. Schmorl's node deformity with mild marrow edema within the partially visualized L3 vertebral body noted. Reactive marrow edema present about the T2-3 facets bilaterally due to facet arthritis. Abnormal marrow edema seen involving the right transverse processes of T12 and L1, raising the possibility for possible acute and/or recent fractures (series 13, image 5). Cord:  Normal signal and morphology. Paraspinal and other soft tissues: Paraspinous soft tissues within normal limits. Atherosclerotic change noted about the visualized aorta. Disc levels: T1-2: Mild disc bulge. Mild bilateral facet hypertrophy. No spinal stenosis. Mild left with moderate right foraminal stenosis. T2-3: Trace anterolisthesis. Minimal disc bulge. Right worse than left facet hypertrophy with associated reactive marrow edema. No spinal stenosis. Mild bilateral foraminal narrowing. T3-4: Negative interspace. Right worse than left facet hypertrophy. No spinal stenosis. Mild bilateral foraminal narrowing. T4-5: Degenerative intervertebral disc space narrowing, with mild disc bulge and endplate spurring. Small central disc protrusion minimally indents the ventral thecal sac (series 15, image 15). Minimal cord flattening without cord signal changes. Mild posterior element hypertrophy. No spinal stenosis. Mild bilateral foraminal narrowing. T5-6: Degenerative intervertebral disc space narrowing with mild disc bulge and reactive endplate spurring. Mild facet  degeneration. No spinal stenosis. Mild bilateral foraminal narrowing. T6-7: Degenerative intervertebral disc space narrowing. Right paracentral disc protrusion indents the right ventral thecal sac (series 15, image 23). Mild endplate spurring. No spinal stenosis. Foramina remain patent. T7-8: Disc desiccation without significant disc bulge. Endplate spurring. Mild facet degeneration. No spinal stenosis. Mild bilateral foraminal stenosis. T8-9: Mild disc bulge. Superimposed right paracentral disc protrusion flattens the right ventral thecal sac (series 16, image 31). Mild facet hypertrophy. No significant spinal stenosis. Mild bilateral foraminal narrowing. T9-10: Diffuse disc bulge with reactive endplate spurring. Superimposed right paracentral to foraminal disc protrusion. Mild bilateral facet hypertrophy. No significant spinal stenosis. Mild to moderate right greater than left foraminal stenosis. T10-11: Minimal annular disc bulge. Mild facet hypertrophy. No spinal stenosis. Mild bilateral foraminal narrowing. T11-12: Mild disc bulge. Mild bilateral facet hypertrophy. No spinal stenosis. Foramina remain patent. T12-L1: Degenerative intervertebral disc space narrowing with diffuse disc bulge. Reactive endplate spurring. Mild facet hypertrophy. No spinal stenosis. Foramina remain patent. IMPRESSION: 1. Abnormal marrow edema involving the right transverse processes of T12 and L1, raising the possibility for acute and/or recent fractures. Correlation with physical exam for pain at these locations recommended. Further evaluation with dedicated CT of the lumbar spine could be performed for confirmatory purposes as warranted. 2. No other acute or recent compression fracture within the thoracic spine. 3. Chronic compression fractures of T8 and T12 with sequelae of prior vertebral augmentation. Additional mild chronic compression deformities involving the superior endplates of T3, L1, and L3 without significant  retropulsion. 4. Multilevel thoracic spondylosis without significant spinal stenosis. Mild to moderate multilevel foraminal narrowing as above. 5. Reactive marrow edema about the T2-3 facets bilaterally due to facet arthritis. Finding could serve as a source for back pain. Electronically Signed   By: Morene Hoard M.D.   On: 04/01/2024 05:20   MR BRAIN WO CONTRAST Result Date: 04/01/2024 CLINICAL DATA:  Initial evaluation for acute neuro deficit, stroke suspected. EXAM: MRI HEAD WITHOUT CONTRAST TECHNIQUE: Multiplanar, multiecho pulse sequences of the brain and surrounding  structures were obtained without intravenous contrast. COMPARISON:  CT from 03/30/2024 FINDINGS: Brain: Diffuse prominence of the CSF containing spaces compatible generalized cerebral atrophy. Patchy T2/FLAIR hyperintensity involving the periventricular deep white matter both cerebral hemispheres, consistent with chronic small vessel ischemic disease, mild to moderate in nature. Few small remote cerebellar infarcts noted. Single possible punctate focus of diffusion signal abnormality noted involving the left cerebellum (series 2, image 14). This is not well seen on corresponding sequences. While this finding could be artifactual nature, a possible small acute ischemic nonhemorrhagic infarct is difficult to exclude, and could be considered in the correct clinical setting. No other evidence for acute or subacute ischemia. Gray-white matter differentiation otherwise maintained. No acute or chronic intracranial blood products. No mass lesion, midline shift or mass effect. No hydrocephalus or extra-axial fluid collection. Pituitary gland within normal limits. Vascular: Major intracranial vascular flow voids are maintained. Skull and upper cervical spine: Craniocervical junction within normal limits. Bone marrow signal intensity normal. No scalp soft tissue abnormality. Sinuses/Orbits: Prior bilateral ocular lens replacement. Changes of  chronic sphenoid ethmoidal sinus disease noted. No significant mastoid effusion. Other: None. IMPRESSION: 1. Single apparent punctate focus of diffusion signal abnormality involving the left cerebellum as above. While this finding could potentially be artifactual nature, a possible small acute ischemic nonhemorrhagic infarct is difficult to exclude, and could be considered in the correct clinical setting. 2. No other acute intracranial abnormality. 3. Generalized cerebral atrophy with mild to moderate chronic small vessel ischemic disease, with a few small remote cerebellar infarcts. Electronically Signed   By: Morene Hoard M.D.   On: 04/01/2024 04:31   DG Pelvis 1-2 Views Result Date: 03/31/2024 CLINICAL DATA:  [id number] Pelvic pain [id number] EXAM: PELVIS - 1-2 VIEW COMPARISON:  X-ray pelvis 01/08/2024 FINDINGS: There is no evidence of pelvic fracture or diastasis. No acute displaced fracture or dislocation of either hips. No pelvic bone lesions are seen. Atherosclerotic plaque. IMPRESSION: Negative for acute traumatic injury. Electronically Signed   By: Morgane  Naveau M.D.   On: 03/31/2024 17:34   CT ANGIO HEAD NECK W WO CM Result Date: 03/30/2024 CLINICAL DATA:  Neuro deficit with concern for stroke. Left-sided facial droop and slurred speech starting 2.5-3 hours ago. EXAM: CT ANGIOGRAPHY HEAD AND NECK WITH AND WITHOUT CONTRAST TECHNIQUE: Multidetector CT imaging of the head and neck was performed using the standard protocol during bolus administration of intravenous contrast. Multiplanar CT image reconstructions and MIPs were obtained to evaluate the vascular anatomy. Carotid stenosis measurements (when applicable) are obtained utilizing NASCET criteria, using the distal internal carotid diameter as the denominator. RADIATION DOSE REDUCTION: This exam was performed according to the departmental dose-optimization program which includes automated exposure control, adjustment of the mA and/or kV according to  patient size and/or use of iterative reconstruction technique. CONTRAST:  75mL OMNIPAQUE  IOHEXOL  350 MG/ML SOLN COMPARISON:  MRI head 03/13/2023. FINDINGS: CT HEAD FINDINGS Brain: No acute intracranial hemorrhage. No CT evidence of acute infarct. Nonspecific hypoattenuation in the periventricular and subcortical white matter favored to reflect chronic microvascular ischemic changes. 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 No edema, mass effect, or midline shift. The basilar cisterns are patent. Ventricles: Ventricles are normal in size and configuration. Vascular: No hyperdense vessel. Intracranial atherosclerotic calcifications noted. Skull: No acute or aggressive finding. Sinuses/orbits: Orbits are symmetric. Postsurgical changes of the maxillary and ethmoid sinuses. Mild mucosal thickening in the left sphenoid sinus as well as the ethmoid sinuses. Possible mucous retention cyst in the left maxillary sinus. Other:  Mastoid air cells are clear. CTA NECK FINDINGS Aortic arch: Standard configuration of the aortic arch. Imaged portion shows no evidence of aneurysm or dissection. Mild atherosclerosis. Pulmonary arteries: As permitted by contrast timing, there are no filling defects in the visualized pulmonary arteries. Subclavian arteries: Patent bilaterally. Moderate stenosis at the origin of the left subclavian artery. Right carotid system: Patent. Mild atherosclerosis at the carotid bifurcation without hemodynamically significant stenosis. No evidence of dissection. Along the mid and distal cervical ICA there is mild irregularity with areas of narrowing and subtle outpouching suggestive of fibromuscular dysplasia. Left carotid system: Patent. Bulky calcified atherosclerosis at the carotid bifurcation without evidence of hemodynamically significant stenosis. No evidence of dissection. There is mild irregularity along the mid cervical ICA with subtle areas of narrowing and outpouching. Vertebral arteries: Codominant.  No evidence of dissection, stenosis (50% or greater), or occlusion. Mild atherosclerosis of the distal right V1 segment without significant stenosis. Skeleton: No acute findings. Degenerative changes in the cervical spine. Irregularity of the T3 vertebral body with mild height loss anteriorly. Other neck: The visualized airway is patent. No cervical lymphadenopathy. The left thyroid  lobe is not visualized and may be hypoplastic versus surgically absent. Upper chest: No acute abnormality in the lung apices. Partially visualized left chest wall pacer device. Review of the MIP images confirms the above findings CTA HEAD FINDINGS ANTERIOR CIRCULATION: The intracranial ICAs are patent bilaterally. Atherosclerosis of the bilateral carotid siphons. There is mild stenosis of both cavernous ICAs. Additional focal moderate stenosis of the left supraclinoid ICA. No proximal occlusion, aneurysm, or vascular malformation. MCAs: The middle cerebral arteries are patent bilaterally. ACAs: The anterior cerebral arteries are patent bilaterally. POSTERIOR CIRCULATION: No significant stenosis, proximal occlusion, aneurysm, or vascular malformation. PCAs: The posterior cerebral arteries are patent bilaterally. Pcomm: The posterior communicating arteries are visualized bilaterally. SCAs: The superior cerebellar arteries are patent bilaterally. Basilar artery: Patent AICAs: Patent PICAs: Patent Vertebral arteries: The intracranial vertebral arteries are patent. Venous sinuses: As permitted by contrast timing, patent. Anatomic variants: None Review of the MIP images confirms the above findings IMPRESSION: No large vessel occlusion. No CT evidence of acute intracranial abnormality. No severe stenosis, aneurysm, or dissection of the arteries in the head and neck. Moderate stenosis of the left supraclinoid ICA. Moderate stenosis at the origin of the left subclavian artery. Findings suggestive of fibromuscular dysplasia within the mid and  distal right cervical ICA and mid left cervical ICA. 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 Chronic microvascular ischemic changes. Paranasal sinus disease as above with layering secretions in the left sphenoid sinus. Recommend clinical correlation for acute sinusitis. Irregularity of the T3 superior endplate with mild height loss anteriorly. Recommend correlation with history of trauma and tenderness at this level. Consider MRI of the thoracic spine for further evaluation. Aortic Atherosclerosis (ICD10-I70.0). Electronically Signed   By: Donnice Mania M.D.   On: 03/30/2024 16:04    ASSESSMENT/PLAN:  Zoe [name] is a 89 y.o. female who presents to the clinic for evaluation for IgG lambda multiple myeloma.   # IgG Lambda Multiple Myeloma  --diagnosis of MM confirmed with bone marrow biopsy showing 60% plasma cells and anemia -- Bone survey shows no lytic lesions, kidney function is within normal limits.  --08/01/2022 was Cycle 1 Day 1 of VRd  -Dr. Federico discontinued velcade  on 02/05/23 due to rash. She is status post day 15 cycle #9.  -Dr. Federico changed the care plan to Daratumumab , Revlimid , and decadron . She is scheduled for day  1 cycle #1 today  -Started Cycle 1, Day 1 of Dara/Rev/Dex on 02/23/2023.  Plan:  --Labs from today show white blood cell 5.5, hemoglobin 12.2, MCV 88, platelets 141. LFTs normal.  Multiple myeloma panel pending today. --Will restart Revlimid  5 mg daily 21 days of 28-day cycle.  --RTC in 4 weeks for labs and clinic visit and to reevaluate how she is doing on Revlimid .  #Hypotension/BP/Atrial Fibrillation  --Follows with cardiology, Dr. Ladona --Was recently admitted from 01/08/2024 through 01/14/2024 after having syncopal episode and found to have complete heart block.  She underwent pacemaker placement. --Patient's blood pressure is 110/80 today which is below her baseline. --Advised to check the blood pressure in the morning before taking her antihypertensives  and follow-up with Dr. Ladona if her blood pressure is low or she is symptomatic. --Continue on Eliquis  5 mg BID   # Normocytic Anemia #Vitamin B12 Deficiency --Anemia likely driven by multiple myeloma, but may be component of vitamin B12 deficiency as well. --initial labs showed elevated MMA with B12 180 -- Continue vitamin B12 1000 mcg p.o. daily -- Continue to monitor   # H/O Left lower extremity neuropathy: --Currently on gabapentin  900 mg nightly and 600 mg in the morning --Encouraged to try water  aerobics and stretches.  --Following with Dr. Bobie (ortho) to see if she is a candidate for steroid injections.  -- Patient evaluated by Dr. Buckley thinks that this may be neuropathy worsened by her Velcade  therapy.   #Episode of right UE tremor and speech abnormality: --Evaluated by Dr. Buckley on 03/20/2023. Presumed etiology had been atrial fibrillation, but vascular disease, atheromatous changes, are also possibly implicated concurrently.  --MRI brain from 03/13/2023 show no acute finding including no evidence of intracranial neoplastic disease. There are chronic small-vessel ischemic changes of the pons, cerebral hemispheric white matter and right cerebellum. --Educated patient to seek immediate evaluate if there are repeat episodes. --No neurological deficits identified per exam today  #Supportive Care -- chemotherapy education complete.  -- port placement not required.  -- zofran  8mg  q8H PRN and compazine  10mg  PO q6H for nausea -- acyclovir  400mg  PO BID for VCZ prophylaxis -- tylenol  1000 mg q8H PRN for back pain.   No orders of the defined types were placed in this encounter.   Patient expressed understanding of the plan provided.   I have spent a total of 30 minutes minutes of face-to-face and non-face-to-face time, preparing to see the patient, performing a medically appropriate examination, counseling and educating the patient, ordering tests/procedures, documenting clinical  information in the electronic health record, and care coordination.   Norleen IVAR Kidney, MD Department of Hematology/Oncology [facility] Phone: [phone] Pager: [phone] Email: [email or web address]

## 2024-04-22 NOTE — Telephone Encounter (Signed)
 Rescheduled and scheduled appointments per 6/30 los. Called and left a VM with appointment details for the patient.

## 2024-04-23 DIAGNOSIS — M62561 Muscle wasting and atrophy, not elsewhere classified, right lower leg: Secondary | ICD-10-CM | POA: Diagnosis not present

## 2024-04-23 DIAGNOSIS — M62562 Muscle wasting and atrophy, not elsewhere classified, left lower leg: Secondary | ICD-10-CM | POA: Diagnosis not present

## 2024-04-23 DIAGNOSIS — R2681 Unsteadiness on feet: Secondary | ICD-10-CM | POA: Diagnosis not present

## 2024-04-23 DIAGNOSIS — R296 Repeated falls: Secondary | ICD-10-CM | POA: Diagnosis not present

## 2024-04-23 LAB — MULTIPLE MYELOMA PANEL, SERUM
Albumin SerPl Elph-Mcnc: 3.3 g/dL (ref 2.9–4.4)
Albumin/Glob SerPl: 1.2 (ref 0.7–1.7)
Alpha 1: 0.2 g/dL (ref 0.0–0.4)
Alpha2 Glob SerPl Elph-Mcnc: 0.8 g/dL (ref 0.4–1.0)
B-Globulin SerPl Elph-Mcnc: 0.9 g/dL (ref 0.7–1.3)
Gamma Glob SerPl Elph-Mcnc: 1 g/dL (ref 0.4–1.8)
Globulin, Total: 2.9 g/dL (ref 2.2–3.9)
IgA: 264 mg/dL (ref 64–422)
IgG (Immunoglobin G), Serum: 987 mg/dL (ref 586–1602)
IgM (Immunoglobulin M), Srm: 62 mg/dL (ref 26–217)
Total Protein ELP: 6.2 g/dL (ref 6.0–8.5)

## 2024-04-24 DIAGNOSIS — M62542 Muscle wasting and atrophy, not elsewhere classified, left hand: Secondary | ICD-10-CM | POA: Diagnosis not present

## 2024-04-24 DIAGNOSIS — R2681 Unsteadiness on feet: Secondary | ICD-10-CM | POA: Diagnosis not present

## 2024-04-24 DIAGNOSIS — M62541 Muscle wasting and atrophy, not elsewhere classified, right hand: Secondary | ICD-10-CM | POA: Diagnosis not present

## 2024-04-25 DIAGNOSIS — M62562 Muscle wasting and atrophy, not elsewhere classified, left lower leg: Secondary | ICD-10-CM | POA: Diagnosis not present

## 2024-04-25 DIAGNOSIS — R296 Repeated falls: Secondary | ICD-10-CM | POA: Diagnosis not present

## 2024-04-25 DIAGNOSIS — R2681 Unsteadiness on feet: Secondary | ICD-10-CM | POA: Diagnosis not present

## 2024-04-25 DIAGNOSIS — M62561 Muscle wasting and atrophy, not elsewhere classified, right lower leg: Secondary | ICD-10-CM | POA: Diagnosis not present

## 2024-04-28 DIAGNOSIS — R2681 Unsteadiness on feet: Secondary | ICD-10-CM | POA: Diagnosis not present

## 2024-04-28 DIAGNOSIS — R296 Repeated falls: Secondary | ICD-10-CM | POA: Diagnosis not present

## 2024-04-28 DIAGNOSIS — M62561 Muscle wasting and atrophy, not elsewhere classified, right lower leg: Secondary | ICD-10-CM | POA: Diagnosis not present

## 2024-04-28 DIAGNOSIS — M62562 Muscle wasting and atrophy, not elsewhere classified, left lower leg: Secondary | ICD-10-CM | POA: Diagnosis not present

## 2024-04-30 ENCOUNTER — Encounter (INDEPENDENT_AMBULATORY_CARE_PROVIDER_SITE_OTHER): Admitting: Ophthalmology

## 2024-05-02 ENCOUNTER — Ambulatory Visit: Admitting: Internal Medicine

## 2024-05-02 DIAGNOSIS — M62561 Muscle wasting and atrophy, not elsewhere classified, right lower leg: Secondary | ICD-10-CM | POA: Diagnosis not present

## 2024-05-02 DIAGNOSIS — M62541 Muscle wasting and atrophy, not elsewhere classified, right hand: Secondary | ICD-10-CM | POA: Diagnosis not present

## 2024-05-02 DIAGNOSIS — R296 Repeated falls: Secondary | ICD-10-CM | POA: Diagnosis not present

## 2024-05-02 DIAGNOSIS — R2681 Unsteadiness on feet: Secondary | ICD-10-CM | POA: Diagnosis not present

## 2024-05-02 DIAGNOSIS — M62542 Muscle wasting and atrophy, not elsewhere classified, left hand: Secondary | ICD-10-CM | POA: Diagnosis not present

## 2024-05-02 DIAGNOSIS — M62562 Muscle wasting and atrophy, not elsewhere classified, left lower leg: Secondary | ICD-10-CM | POA: Diagnosis not present

## 2024-05-05 ENCOUNTER — Other Ambulatory Visit: Payer: Self-pay | Admitting: *Deleted

## 2024-05-05 MED ORDER — LENALIDOMIDE 5 MG PO CAPS: 5.0000 mg | ORAL_CAPSULE | Freq: Every day | ORAL | 0 refills | Status: DC

## 2024-05-06 ENCOUNTER — Encounter (INDEPENDENT_AMBULATORY_CARE_PROVIDER_SITE_OTHER): Admitting: Ophthalmology

## 2024-05-06 DIAGNOSIS — H35033 Hypertensive retinopathy, bilateral: Secondary | ICD-10-CM | POA: Diagnosis not present

## 2024-05-06 DIAGNOSIS — H43813 Vitreous degeneration, bilateral: Secondary | ICD-10-CM | POA: Diagnosis not present

## 2024-05-06 DIAGNOSIS — H353231 Exudative age-related macular degeneration, bilateral, with active choroidal neovascularization: Secondary | ICD-10-CM

## 2024-05-06 DIAGNOSIS — I10 Essential (primary) hypertension: Secondary | ICD-10-CM

## 2024-05-07 DIAGNOSIS — M62562 Muscle wasting and atrophy, not elsewhere classified, left lower leg: Secondary | ICD-10-CM | POA: Diagnosis not present

## 2024-05-07 DIAGNOSIS — R296 Repeated falls: Secondary | ICD-10-CM | POA: Diagnosis not present

## 2024-05-07 DIAGNOSIS — M62541 Muscle wasting and atrophy, not elsewhere classified, right hand: Secondary | ICD-10-CM | POA: Diagnosis not present

## 2024-05-07 DIAGNOSIS — M62561 Muscle wasting and atrophy, not elsewhere classified, right lower leg: Secondary | ICD-10-CM | POA: Diagnosis not present

## 2024-05-07 DIAGNOSIS — R2681 Unsteadiness on feet: Secondary | ICD-10-CM | POA: Diagnosis not present

## 2024-05-07 DIAGNOSIS — M62542 Muscle wasting and atrophy, not elsewhere classified, left hand: Secondary | ICD-10-CM | POA: Diagnosis not present

## 2024-05-09 DIAGNOSIS — R296 Repeated falls: Secondary | ICD-10-CM | POA: Diagnosis not present

## 2024-05-09 DIAGNOSIS — M62562 Muscle wasting and atrophy, not elsewhere classified, left lower leg: Secondary | ICD-10-CM | POA: Diagnosis not present

## 2024-05-09 DIAGNOSIS — M62561 Muscle wasting and atrophy, not elsewhere classified, right lower leg: Secondary | ICD-10-CM | POA: Diagnosis not present

## 2024-05-09 DIAGNOSIS — R2681 Unsteadiness on feet: Secondary | ICD-10-CM | POA: Diagnosis not present

## 2024-05-12 DIAGNOSIS — R2681 Unsteadiness on feet: Secondary | ICD-10-CM | POA: Diagnosis not present

## 2024-05-12 DIAGNOSIS — M62561 Muscle wasting and atrophy, not elsewhere classified, right lower leg: Secondary | ICD-10-CM | POA: Diagnosis not present

## 2024-05-12 DIAGNOSIS — R296 Repeated falls: Secondary | ICD-10-CM | POA: Diagnosis not present

## 2024-05-12 DIAGNOSIS — M62562 Muscle wasting and atrophy, not elsewhere classified, left lower leg: Secondary | ICD-10-CM | POA: Diagnosis not present

## 2024-05-14 ENCOUNTER — Ambulatory Visit: Admitting: Internal Medicine

## 2024-05-19 ENCOUNTER — Inpatient Hospital Stay: Payer: Self-pay | Admitting: Hematology and Oncology

## 2024-05-19 ENCOUNTER — Inpatient Hospital Stay: Attending: Physician Assistant

## 2024-05-19 VITALS — BP 137/69 | HR 64 | Temp 97.6°F | Resp 13 | Wt 170.4 lb

## 2024-05-19 DIAGNOSIS — R251 Tremor, unspecified: Secondary | ICD-10-CM | POA: Insufficient documentation

## 2024-05-19 DIAGNOSIS — D649 Anemia, unspecified: Secondary | ICD-10-CM | POA: Insufficient documentation

## 2024-05-19 DIAGNOSIS — C9000 Multiple myeloma not having achieved remission: Secondary | ICD-10-CM | POA: Insufficient documentation

## 2024-05-19 DIAGNOSIS — I959 Hypotension, unspecified: Secondary | ICD-10-CM | POA: Insufficient documentation

## 2024-05-19 DIAGNOSIS — E538 Deficiency of other specified B group vitamins: Secondary | ICD-10-CM | POA: Insufficient documentation

## 2024-05-19 DIAGNOSIS — I4891 Unspecified atrial fibrillation: Secondary | ICD-10-CM | POA: Insufficient documentation

## 2024-05-19 DIAGNOSIS — D696 Thrombocytopenia, unspecified: Secondary | ICD-10-CM | POA: Diagnosis not present

## 2024-05-19 DIAGNOSIS — Z7901 Long term (current) use of anticoagulants: Secondary | ICD-10-CM | POA: Insufficient documentation

## 2024-05-19 LAB — CBC WITH DIFFERENTIAL (CANCER CENTER ONLY)
Abs Immature Granulocytes: 0.01 K/uL (ref 0.00–0.07)
Basophils Absolute: 0.1 K/uL (ref 0.0–0.1)
Basophils Relative: 1 %
Eosinophils Absolute: 0.1 K/uL (ref 0.0–0.5)
Eosinophils Relative: 1 %
HCT: 36.1 % (ref 36.0–46.0)
Hemoglobin: 11.9 g/dL — ABNORMAL LOW (ref 12.0–15.0)
Immature Granulocytes: 0 %
Lymphocytes Relative: 14 %
Lymphs Abs: 0.9 K/uL (ref 0.7–4.0)
MCH: 28.8 pg (ref 26.0–34.0)
MCHC: 33 g/dL (ref 30.0–36.0)
MCV: 87.4 fL (ref 80.0–100.0)
Monocytes Absolute: 1.1 K/uL — ABNORMAL HIGH (ref 0.1–1.0)
Monocytes Relative: 16 %
Neutro Abs: 4.5 K/uL (ref 1.7–7.7)
Neutrophils Relative %: 68 %
Platelet Count: 157 K/uL (ref 150–400)
RBC: 4.13 MIL/uL (ref 3.87–5.11)
RDW: 15.9 % — ABNORMAL HIGH (ref 11.5–15.5)
WBC Count: 6.6 K/uL (ref 4.0–10.5)
nRBC: 0 % (ref 0.0–0.2)

## 2024-05-19 LAB — CMP (CANCER CENTER ONLY)
ALT: 8 U/L (ref 0–44)
AST: 12 U/L — ABNORMAL LOW (ref 15–41)
Albumin: 3.5 g/dL (ref 3.5–5.0)
Alkaline Phosphatase: 84 U/L (ref 38–126)
Anion gap: 7 (ref 5–15)
BUN: 27 mg/dL — ABNORMAL HIGH (ref 8–23)
CO2: 28 mmol/L (ref 22–32)
Calcium: 8.7 mg/dL — ABNORMAL LOW (ref 8.9–10.3)
Chloride: 103 mmol/L (ref 98–111)
Creatinine: 1.15 mg/dL — ABNORMAL HIGH (ref 0.44–1.00)
GFR, Estimated: 46 mL/min — ABNORMAL LOW (ref >60)
Glucose, Bld: 122 mg/dL — ABNORMAL HIGH (ref 70–99)
Potassium: 3.5 mmol/L (ref 3.5–5.1)
Sodium: 138 mmol/L (ref 135–145)
Total Bilirubin: 0.4 mg/dL (ref 0.0–1.2)
Total Protein: 6.4 g/dL — ABNORMAL LOW (ref 6.5–8.1)

## 2024-05-19 NOTE — Progress Notes (Unsigned)
 [name] [phone]   [phone]   PROGRESS NOTE   Patient Care Team: [facility], [facility] as PCP - General (Rheumatology)   Hematological/Oncological History # IgG Lambda Multiple Myeloma  06/14/2022: establish care with [facility] due to anemia. Labs showed M protein 4.9, Kappa 7.2, Lambda 10.8, ratio 0.67 07/13/2022: Bmbx showed Lambda restricted plasma cell neoplasm involving approximately 60% of the cellular marrow by IHC on the biopsy.  08/01/2022: Cycle 1 Day 1 of VRd chemotherapy. (Holding revlimid  initially) 08/21/2022:  Cycle 2 Day 1 of VRd chemotherapy. (Holding revlimid ) 09/04/2022: Cycle 3 Day 1 of VRd chemotherapy. Started revlimid  10/03/2022: Cycle 4 Day 1 of VRd chemotherapy 10/31/2022: Cycle 5 Day 1 of VRd chemotherapy 11/20/2022: Cycle 6 Day 1 of VRd chemotherapy 12/11/2022: Cycle 7 Day 1 of VRd chemotherapy 01/02/2023: Cycle 8 Day 1 of VRd chemotherapy 01/22/2023: Cycle 9 Day 1 of VRd chemotherapy 02/23/2023: Day 1 Cycle 1 of Dara/Rev/Dex 03/23/2023: Day 1 Cycle 2 of Dara/Rev/Dex 05/06/2023: chemotherapy on pause due to chest pain/cardiac/respiratory issues.  05/18/2023: Cycle 3 Day 1 of Dara/Rev/Dex 06/15/2023: Cycle 4 Day 1 of Dara/Rev/Dex 07/13/2023: Cycle 5 Day 1 of Dara/Rev/Dex 08/10/2023: Cycle 6 Day 1 of Dara/Rev/Dex. Transition to maintenance revlimid .   INTERVAL HISTORY: Zoe  B [name] 89 y.o. female returns to the clinic today for a follow-up visit accompanied by her daughter-in-law.   Her last visit was on 04/21/2024. In the interim, she has had no major changes in her health.   Zoe [name] reports she has been well overall with them since her last visit and using the walker to try to keep her self steady.  She reports that she does like it there but it can get lonely sometimes.  She reports there is decent food and many activities.  Her appetite remains strong.  Her energy is fair.  And she does rest a lot.   She notes that she does have some pain in her neck recently and has been using a heating pad and Tylenol .  She is not having any additional bone pain.  She notes that she is not having any difficulty with her Revlimid  pills at the 5 mg dosage.  She reports that she is working towards getting dental clearance for her Zometa infusions.  Otherwise she feels well and has no questions concerns or complaints today.  She is willing and able to continue Revlimid  at this time.  A full 10 point ROS is otherwise negative.  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  MEDICATIONS:  Current Outpatient Medications  Medication Sig Dispense Refill   acetaminophen  (TYLENOL ) 500 MG tablet Take 1,000 mg by mouth every 6 (six) hours as needed.     albuterol  (VENTOLIN   HFA) 108 (90 Base) MCG/ACT inhaler Inhale 2 puffs into the lungs every 6 (six) hours as needed for wheezing or shortness of breath.     amiodarone  (PACERONE ) 100 MG tablet Take 100 mg by mouth daily.     amLODipine  (NORVASC ) 5 MG tablet Take 5 mg by mouth daily.     apixaban  (ELIQUIS ) 5 MG TABS tablet Take 1 tablet (5 mg total) by mouth 2 (two) times daily. 180 tablet 1   Artificial Tear Solution (SOOTHE XP) SOLN Place 1 drop into both eyes every evening.     Cholecalciferol  (VITAMIN D3) 50 MCG (2000 UT) capsule Take 1 capsule  (2,000 Units total) by mouth daily.     [employer] VITAMIN B12 1000 MCG tablet TAKE 1 TABLET BY MOUTH EVERY DAY 90 tablet 1   furosemide  (LASIX ) 20 MG tablet Three times a week as needed; take potassium if taking lasix      lenalidomide  (REVLIMID ) 5 MG capsule Take 1 capsule (5 mg total) by mouth daily. [employer] Auth #  [id number]    Date Obtained 05/05/24  Take 1 daily for 21 days then none for 7 days 21 capsule 0   levothyroxine  (SYNTHROID , LEVOTHROID) 100 MCG tablet Take 100 mcg by mouth daily before breakfast.  2   losartan  (COZAAR ) 50 MG tablet Take 1 tablet (50 mg total) by mouth daily.     metoprolol  tartrate (LOPRESSOR ) 50 MG tablet Take 50 mg by mouth 2 (two) times daily.     Multiple Vitamins-Minerals (PRESERVISION AREDS) CAPS Take 1 capsule by mouth 2 (two) times daily.     pantoprazole  (PROTONIX ) 40 MG tablet Take 1 tablet (40 mg total) by mouth daily.     potassium chloride  SA (KLOR-CON  M) 20 MEQ tablet Three times a week as needed only if taking lasix      rosuvastatin  (CRESTOR ) 20 MG tablet Take 1 tablet (20 mg total) by mouth daily. 90 tablet 3   No current facility-administered medications for this visit.    SURGICAL HISTORY:  Past Surgical History:  Procedure Laterality Date   ABDOMINAL HYSTERECTOMY  1970's   BACK SURGERY     BREAST LUMPECTOMY  12/1998   lumpectomy   CARDIAC CATHETERIZATION     CARDIOVERSION N/A 04/18/2023   Procedure: CARDIOVERSION;  Surgeon: Michele Richardson, DO;  Location: [facility];  Service: Cardiovascular;  Laterality: N/A;   CARDIOVERSION N/A 04/20/2023   Procedure: CARDIOVERSION;  Surgeon: Ladona Heinz, MD;  Location: [facility];  Service: Cardiovascular;  Laterality: N/A;   EYE SURGERY     cataract surgery bilat    INTRAOPERATIVE TRANSTHORACIC ECHOCARDIOGRAM N/A 09/03/2018   Procedure: INTRAOPERATIVE TRANSTHORACIC ECHOCARDIOGRAM;  Surgeon: Verlin Lonni BIRCH, MD;  Location: [facility] OR;  Service: Open Heart Surgery;  Laterality: N/A;    KYPHOPLASTY N/A 09/07/2022   Procedure: THORACIC EIGHT KYPHOPLASTY;  Surgeon: Burnetta Aures, MD;  Location: [facility] OR;  Service: Orthopedics;  Laterality: N/A;  1 hr Local with [facility] 3 C-Bed   LITHOTRIPSY     PACEMAKER IMPLANT N/A 01/09/2024   Procedure: PACEMAKER IMPLANT;  Surgeon: Waddell Danelle ORN, MD;  Location: [facility];  Service: Cardiovascular;  Laterality: N/A;   Right total knee     2018 Dr. Ernie   RIGHT/LEFT HEART CATH AND CORONARY ANGIOGRAPHY N/A 08/06/2018   Procedure: RIGHT/LEFT HEART CATH AND CORONARY ANGIOGRAPHY;  Surgeon: Ladona Heinz, MD;  Location: [facility];  Service: Cardiovascular;  Laterality: N/A;   THYROIDECTOMY, PARTIAL  1975  TONSILLECTOMY     as a child - patient not sure of exact date   TOTAL KNEE ARTHROPLASTY Left 03/13/2016   Procedure: TOTAL KNEE ARTHROPLASTY;  Surgeon: Donnice Car, MD;  Location: [facility] ORS;  Service: Orthopedics;  Laterality: Left;   TOTAL KNEE ARTHROPLASTY Right 06/18/2017   Procedure: RIGHT TOTAL KNEE ARTHROPLASTY;  Surgeon: Car Donnice, MD;  Location: [facility] ORS;  Service: Orthopedics;  Laterality: Right;   TRANSCATHETER AORTIC VALVE REPLACEMENT, TRANSFEMORAL N/A 09/03/2018   Procedure: TRANSCATHETER AORTIC VALVE REPLACEMENT, TRANSFEMORAL;  Surgeon: Verlin Lonni BIRCH, MD;  Location: [facility] OR;  Service: Open Heart Surgery;  Laterality: N/A;   REVIEW OF SYSTEMS:   Constitutional: ( - ) fevers, ( - )  chills , ( - ) night sweats Eyes: ( - ) blurriness of vision, ( - ) double vision, ( - ) watery eyes Ears, nose, mouth, throat, and face: ( - ) mucositis, ( - ) sore throat Respiratory: ( -) cough, ( - ) dyspnea, ( - ) wheezes Cardiovascular: ( - ) palpitation, ( - ) chest discomfort, ( +) lower extremity swelling Gastrointestinal:  ( - ) nausea, ( - ) heartburn, ( - ) change in bowel habits Skin: ( - ) abnormal skin rashes Lymphatics: ( - ) new lymphadenopathy, ( - ) easy bruising Neurological: ( - ) numbness, ( - )  tingling, ( - ) new weaknesses Behavioral/Psych: ( - ) mood change, ( - ) new changes  All other systems were reviewed with the patient and are negative.   PHYSICAL EXAMINATION:  Blood pressure 137/69, pulse 64, temperature 97.6 F (36.4 C), temperature source Temporal, resp. rate 13, weight 170 lb 6.4 oz (77.3 kg), SpO2 98%.  ECOG PERFORMANCE STATUS: 1  GENERAL: well appearing elderly Caucasian female, alert, no distress and comfortable SKIN: skin color, texture, turgor are normal, no rashes or significant lesions EYES: conjunctiva are pink and non-injected, sclera clear LUNGS:normal breathing effort.  HEART: irregular rhythm ,normal rate and no murmurs.  Musculoskeletal: no cyanosis of digits and no clubbing  PSYCH: alert & oriented x 3, fluent speech NEURO: no focal motor/sensory deficits    LABORATORY DATA: Lab Results  Component Value Date   WBC 6.6 05/19/2024   HGB 11.9 (L) 05/19/2024   HCT 36.1 05/19/2024   MCV 87.4 05/19/2024   PLT 157 05/19/2024      Chemistry      Component Value Date/Time   NA 138 05/19/2024 1014   NA 130 (L) 06/19/2022 1012   K 3.5 05/19/2024 1014   CL 103 05/19/2024 1014   CO2 28 05/19/2024 1014   BUN 27 (H) 05/19/2024 1014   BUN 21 06/19/2022 1012   CREATININE 1.15 (H) 05/19/2024 1014      Component Value Date/Time   CALCIUM  8.7 (L) 05/19/2024 1014   ALKPHOS 84 05/19/2024 1014   AST 12 (L) 05/19/2024 1014   ALT 8 05/19/2024 1014   BILITOT 0.4 05/19/2024 1014       RADIOGRAPHIC STUDIES:  No results found.   ASSESSMENT/PLAN:  Zoe  B [name] is a 89 y.o. female who presents to the clinic for evaluation for IgG lambda multiple myeloma.   # IgG Lambda Multiple Myeloma  --diagnosis of MM confirmed with bone marrow biopsy showing 60% plasma cells and anemia -- Bone survey shows no lytic lesions, kidney function is within normal limits.  --08/01/2022 was Cycle 1 Day 1 of VRd  - discontinued velcade  on 02/05/23 due to rash.  She is status post  day 15 cycle #9.  -changed the care plan to Daratumumab , Revlimid , and decadron . She is scheduled for day 1 cycle #1 today  -Started Cycle 1, Day 1 of Dara/Rev/Dex on 02/23/2023.  Plan:  --Labs from today show white blood cell 6.6, hemoglobin 1.9, MCV 87.4, platelets 157. LFTs normal.  Multiple myeloma panel pending today. --Will restart Revlimid  5 mg daily 21 days of 28-day cycle.  --RTC in 4 weeks for labs and clinic visit and to reevaluate how she is doing on Revlimid .  #Hypotension/BP/Atrial Fibrillation  --Follows with cardiology, Dr. Ladona --Was recently admitted from 01/08/2024 through 01/14/2024 after having syncopal episode and found to have complete heart block.  She underwent pacemaker placement. --Patient's blood pressure is 110/80 today which is below her baseline. --Advised to check the blood pressure in the morning before taking her antihypertensives and follow-up with Dr. Ladona if her blood pressure is low or she is symptomatic. --Continue on Eliquis  5 mg BID   # Normocytic Anemia #Vitamin B12 Deficiency --Anemia likely driven by multiple myeloma, but may be component of vitamin B12 deficiency as well. --initial labs showed elevated MMA with B12 180 -- Continue vitamin B12 1000 mcg p.o. daily -- Continue to monitor   # H/O Left lower extremity neuropathy: --Currently on gabapentin  900 mg nightly and 600 mg in the morning --Encouraged to try water  aerobics and stretches.  --Following with Dr. Bobie (ortho) to see if she is a candidate for steroid injections.  -- Patient evaluated by Dr. Buckley thinks that this may be neuropathy worsened by her Velcade  therapy.   #Episode of right UE tremor and speech abnormality: --Evaluated by Dr. Buckley on 03/20/2023. Presumed etiology had been atrial fibrillation, but vascular disease, atheromatous changes, are also possibly implicated concurrently.  --MRI brain from 03/13/2023 show no acute finding including no evidence  of intracranial neoplastic disease. There are chronic small-vessel ischemic changes of the pons, cerebral hemispheric white matter and right cerebellum. --Educated patient to seek immediate evaluate if there are repeat episodes. --No neurological deficits identified per exam today  #Supportive Care -- chemotherapy education complete.  -- port placement not required.  -- zofran  8mg  q8H PRN and compazine  10mg  PO q6H for nausea -- acyclovir  400mg  PO BID for VCZ prophylaxis -- tylenol  1000 mg q8H PRN for back pain.   No orders of the defined types were placed in this encounter.   Patient expressed understanding of the plan provided.   I have spent a total of 30 minutes minutes of face-to-face and non-face-to-face time, preparing to see the patient, performing a medically appropriate examination, counseling and educating the patient, ordering tests/procedures, documenting clinical information in the electronic health record, and care coordination.   Norleen IVAR Kidney, MD Department of Hematology/Oncology [name] Phone: [phone] Pager: [phone] Email: [email or web address]

## 2024-05-20 DIAGNOSIS — M62562 Muscle wasting and atrophy, not elsewhere classified, left lower leg: Secondary | ICD-10-CM | POA: Diagnosis not present

## 2024-05-20 DIAGNOSIS — M62561 Muscle wasting and atrophy, not elsewhere classified, right lower leg: Secondary | ICD-10-CM | POA: Diagnosis not present

## 2024-05-20 DIAGNOSIS — R2681 Unsteadiness on feet: Secondary | ICD-10-CM | POA: Diagnosis not present

## 2024-05-20 DIAGNOSIS — R296 Repeated falls: Secondary | ICD-10-CM | POA: Diagnosis not present

## 2024-05-21 LAB — MULTIPLE MYELOMA PANEL, SERUM
Albumin SerPl Elph-Mcnc: 3.2 g/dL (ref 2.9–4.4)
Albumin/Glob SerPl: 1.2 (ref 0.7–1.7)
Alpha 1: 0.3 g/dL (ref 0.0–0.4)
Alpha2 Glob SerPl Elph-Mcnc: 0.8 g/dL (ref 0.4–1.0)
B-Globulin SerPl Elph-Mcnc: 0.9 g/dL (ref 0.7–1.3)
Gamma Glob SerPl Elph-Mcnc: 0.9 g/dL (ref 0.4–1.8)
Globulin, Total: 2.8 g/dL (ref 2.2–3.9)
IgA: 287 mg/dL (ref 64–422)
IgG (Immunoglobin G), Serum: 1010 mg/dL (ref 586–1602)
IgM (Immunoglobulin M), Srm: 64 mg/dL (ref 26–217)
Total Protein ELP: 6 g/dL (ref 6.0–8.5)

## 2024-05-21 LAB — KAPPA/LAMBDA LIGHT CHAINS
Kappa free light chain: 43.1 mg/L — ABNORMAL HIGH (ref 3.3–19.4)
Kappa, lambda light chain ratio: 1.4 (ref 0.26–1.65)
Lambda free light chains: 30.8 mg/L — ABNORMAL HIGH (ref 5.7–26.3)

## 2024-05-26 ENCOUNTER — Ambulatory Visit

## 2024-05-26 ENCOUNTER — Encounter (HOSPITAL_BASED_OUTPATIENT_CLINIC_OR_DEPARTMENT_OTHER): Payer: Self-pay

## 2024-05-26 DIAGNOSIS — I442 Atrioventricular block, complete: Secondary | ICD-10-CM

## 2024-05-26 DIAGNOSIS — R051 Acute cough: Secondary | ICD-10-CM | POA: Diagnosis not present

## 2024-05-27 ENCOUNTER — Ambulatory Visit: Payer: Self-pay | Admitting: Internal Medicine

## 2024-05-27 LAB — CUP PACEART REMOTE DEVICE CHECK
Battery Remaining Longevity: 150 mo
Battery Voltage: 3.19 V
Brady Statistic AP VP Percent: 15.4 %
Brady Statistic AP VS Percent: 0 %
Brady Statistic AS VP Percent: 84.52 %
Brady Statistic AS VS Percent: 0.07 %
Brady Statistic RA Percent Paced: 15.36 %
Brady Statistic RV Percent Paced: 99.92 %
Date Time Interrogation Session: 20250804041319
Implantable Lead Connection Status: 753985
Implantable Lead Connection Status: 753985
Implantable Lead Implant Date: 20250319
Implantable Lead Implant Date: 20250319
Implantable Lead Location: 753859
Implantable Lead Location: 753860
Implantable Lead Model: 3830
Implantable Lead Model: 5076
Implantable Pulse Generator Implant Date: 20250319
Lead Channel Impedance Value: 285 Ohm
Lead Channel Impedance Value: 361 Ohm
Lead Channel Impedance Value: 475 Ohm
Lead Channel Impedance Value: 532 Ohm
Lead Channel Pacing Threshold Amplitude: 0.625 V
Lead Channel Pacing Threshold Amplitude: 1.125 V
Lead Channel Pacing Threshold Pulse Width: 0.4 ms
Lead Channel Pacing Threshold Pulse Width: 0.4 ms
Lead Channel Sensing Intrinsic Amplitude: 4 mV
Lead Channel Sensing Intrinsic Amplitude: 4 mV
Lead Channel Sensing Intrinsic Amplitude: 8.625 mV
Lead Channel Sensing Intrinsic Amplitude: 8.625 mV
Lead Channel Setting Pacing Amplitude: 1.5 V
Lead Channel Setting Pacing Amplitude: 2.25 V
Lead Channel Setting Pacing Pulse Width: 0.4 ms
Lead Channel Setting Sensing Sensitivity: 1.2 mV
Zone Setting Status: 755011

## 2024-05-28 ENCOUNTER — Encounter: Payer: Self-pay | Admitting: Internal Medicine

## 2024-05-28 ENCOUNTER — Ambulatory Visit: Attending: Cardiology | Admitting: Internal Medicine

## 2024-05-28 VITALS — BP 103/64 | HR 72 | Ht 63.0 in | Wt 171.9 lb

## 2024-05-28 DIAGNOSIS — I10 Essential (primary) hypertension: Secondary | ICD-10-CM

## 2024-05-28 DIAGNOSIS — I442 Atrioventricular block, complete: Secondary | ICD-10-CM

## 2024-05-28 LAB — CUP PACEART INCLINIC DEVICE CHECK
Battery Remaining Longevity: 159 mo
Battery Voltage: 3.19 V
Brady Statistic AP VP Percent: 12.39 %
Brady Statistic AP VS Percent: 13.84 %
Brady Statistic AS VP Percent: 62.19 %
Brady Statistic AS VS Percent: 11.58 %
Brady Statistic RA Percent Paced: 26.16 %
Brady Statistic RV Percent Paced: 74.64 %
Date Time Interrogation Session: 20250806151430
Implantable Lead Connection Status: 753985
Implantable Lead Connection Status: 753985
Implantable Lead Implant Date: 20250319
Implantable Lead Implant Date: 20250319
Implantable Lead Location: 753859
Implantable Lead Location: 753860
Implantable Lead Model: 3830
Implantable Lead Model: 5076
Implantable Pulse Generator Implant Date: 20250319
Lead Channel Impedance Value: 342 Ohm
Lead Channel Impedance Value: 418 Ohm
Lead Channel Impedance Value: 532 Ohm
Lead Channel Impedance Value: 589 Ohm
Lead Channel Pacing Threshold Amplitude: 0.75 V
Lead Channel Pacing Threshold Amplitude: 1 V
Lead Channel Pacing Threshold Pulse Width: 0.4 ms
Lead Channel Pacing Threshold Pulse Width: 0.4 ms
Lead Channel Sensing Intrinsic Amplitude: 4.125 mV
Lead Channel Sensing Intrinsic Amplitude: 4.875 mV
Lead Channel Sensing Intrinsic Amplitude: 8.625 mV
Lead Channel Sensing Intrinsic Amplitude: 8.625 mV
Lead Channel Setting Pacing Amplitude: 1.5 V
Lead Channel Setting Pacing Amplitude: 2 V
Lead Channel Setting Pacing Pulse Width: 0.4 ms
Lead Channel Setting Sensing Sensitivity: 1.2 mV
Zone Setting Status: 755011

## 2024-05-28 NOTE — Patient Instructions (Signed)
 Medication Instructions:  Your physician recommends that you continue on your current medications as directed. Please refer to the Current Medication list given to you today.  *If you need a refill on your cardiac medications before your next appointment, please call your pharmacy*  Lab Work: None ordered.  You may go to any [facility] Location for your lab work:  [facility] - [address] ([facility]) - [address] [phone] [address]   - [address]   [place]  - [address]   [place] - [address] - [address] [employer]  - [address] ([facility]   If you have labs (blood work) drawn today and your tests are completely normal, you will receive your results only by: [employer] (if you have MyChart)  If you have any lab test that is abnormal or we need to change your treatment, we will call you or send a MyChart message to review the results.  Testing/Procedures: None ordered.  Follow-Up: At [facility], you and your health needs are our priority.  As part of our continuing mission to provide you with exceptional heart care, we have created designated Provider Care Teams.  These Care Teams include your primary Cardiologist (physician) and Advanced Practice Providers (APPs -  Physician Assistants and Nurse Practitioners) who all work together to provide you with the care you need, when you need it.  We recommend signing up for the patient portal called "MyChart".  Sign up information is provided on this After Visit Summary.  MyChart is used to connect with patients for Virtual Visits (Telemedicine).  Patients are able to view lab/test results, encounter notes, upcoming appointments, etc.  Non-urgent messages can be sent to your provider as well.   To learn more about what you can do with MyChart, go to  [email or web address].    Your next appointment:   1 year(s)  The format for your next appointment:   In Person  Provider:   Harvie Liner, MD{or one of the following Advanced Practice Providers on your designated Care Team:   Mertha Abrahams, [place] Bambi Lever "Jonelle Neri" [place], [place] Neda Balk, NP  Note: Remote monitoring is used to monitor your Pacemaker/ ICD from home. This monitoring reduces the number of office visits required to check your device to one time per year. It allows us  to keep an eye on the functioning of your device to ensure it is working properly.

## 2024-05-28 NOTE — Progress Notes (Signed)
 HPI Zoe [name] returns today for followup after undergoing PPM insertion due to [employer]	[place] syncope. She also has a h/o AS and is s/p TAVR. She has a h/o chronic chest discomfort and since her PPM insertion she notes TIA symptoms in June. Since then she has had pneumonia and is on anti-biotics.  Allergies  Allergen Reactions   Quinolones Other (See Comments)    Patient on Amiodarone  and can prolong QT   Penicillins Other (See Comments)    UNSPECIFIED REACTION  Patient does not remember reaction.  Has patient had a PCN reaction causing immediate rash, facial/tongue/throat swelling, SOB or lightheadedness with hypotension: no Has patient had a PCN reaction causing severe rash involving mucus membranes or skin necrosis: no Has patient had a PCN reaction that required hospitalization no Has patient had a PCN reaction occurring within the last 10 years: no If all of the above answers are NO, then may proceed with Cephalosporin use.    Sulfa Antibiotics Other (See Comments)    UNSPECIFIED REACTION  maybe vision issues?      Current Outpatient Medications  Medication Sig Dispense Refill   acetaminophen  (TYLENOL ) 500 MG tablet Take 1,000 mg by mouth every 6 (six) hours as needed.     albuterol  (VENTOLIN  HFA) 108 (90 Base) MCG/ACT inhaler Inhale 2 puffs into the lungs every 6 (six) hours as needed for wheezing or shortness of breath.     amiodarone  (PACERONE ) 100 MG tablet Take 100 mg by mouth daily.     amLODipine  (NORVASC ) 5 MG tablet Take 5 mg by mouth daily.     apixaban  (ELIQUIS ) 5 MG TABS tablet Take 1 tablet (5 mg total) by mouth 2 (two) times daily. 180 tablet 1   Artificial Tear Solution (SOOTHE XP) SOLN Place 1 drop into both eyes every evening.     Cholecalciferol  (VITAMIN D3) 50 MCG (2000 UT) capsule Take 1 capsule (2,000 Units total) by mouth daily.     CVS VITAMIN B12 1000 MCG tablet TAKE 1 TABLET BY MOUTH EVERY DAY 90 tablet 1   furosemide  (LASIX ) 20 MG tablet  Three times a week as needed; take potassium if taking lasix      lenalidomide  (REVLIMID ) 5 MG capsule Take 1 capsule (5 mg total) by mouth daily. [employer] Auth #  [id number]    Date Obtained 05/05/24  Take 1 daily for 21 days then none for 7 days 21 capsule 0   levothyroxine  (SYNTHROID , LEVOTHROID) 100 MCG tablet Take 100 mcg by mouth daily before breakfast.  2   losartan  (COZAAR ) 50 MG tablet Take 1 tablet (50 mg total) by mouth daily.     metoprolol  tartrate (LOPRESSOR ) 50 MG tablet Take 50 mg by mouth 2 (two) times daily.     Multiple Vitamins-Minerals (PRESERVISION AREDS) CAPS Take 1 capsule by mouth 2 (two) times daily.     pantoprazole  (PROTONIX ) 40 MG tablet Take 1 tablet (40 mg total) by mouth daily.     potassium chloride  SA (KLOR-CON  M) 20 MEQ tablet Three times a week as needed only if taking lasix      rosuvastatin  (CRESTOR ) 20 MG tablet Take 1 tablet (20 mg total) by mouth daily. 90 tablet 3   No current facility-administered medications for this visit.     Past Medical History:  Diagnosis Date   Arthritis    some - per patient   Atrial fibrillation (HCC)    Breast cancer (HCC)    breast cancer / left  Cataract    bilat    GERD (gastroesophageal reflux disease)    History of kidney stones    Hyperlipidemia    Hypertension    Hypothyroidism    Macular degeneration    Left   S/P TAVR (transcatheter aortic valve replacement) 09/03/2018   23 mm Edwards Sapien 3 transcatheter heart valve placed via percutaneous right transfemoral approach    Severe aortic stenosis    Stress incontinence    Thyroid  disease    Tinnitus     ROS:   All systems reviewed and negative except as noted in the HPI.   Past Surgical History:  Procedure Laterality Date   ABDOMINAL HYSTERECTOMY  1970's   BACK SURGERY     BREAST LUMPECTOMY  12/1998   lumpectomy   CARDIAC CATHETERIZATION     CARDIOVERSION N/A 04/18/2023   Procedure: CARDIOVERSION;  Surgeon: Michele Richardson, DO;  [facility];  Service: Cardiovascular;  Laterality: N/A;   CARDIOVERSION N/A 04/20/2023   Procedure: CARDIOVERSION;  Surgeon: Ladona Heinz, MD;  [facility]: [facility];  Service: Cardiovascular;  Laterality: N/A;   EYE SURGERY     cataract surgery bilat    INTRAOPERATIVE TRANSTHORACIC ECHOCARDIOGRAM N/A 09/03/2018   Procedure: INTRAOPERATIVE TRANSTHORACIC ECHOCARDIOGRAM;  Surgeon: Verlin Lonni BIRCH, MD;  Location: [facility] OR;  Service: Open Heart Surgery;  Laterality: N/A;   KYPHOPLASTY N/A 09/07/2022   Procedure: THORACIC EIGHT KYPHOPLASTY;  Surgeon: Burnetta Aures, MD;  Location: [facility] OR;  Service: Orthopedics;  Laterality: N/A;  1 hr Local with IV [facility] 3 C-Bed   LITHOTRIPSY     PACEMAKER IMPLANT N/A 01/09/2024   Procedure: PACEMAKER IMPLANT;  Surgeon: Waddell Danelle ORN, MD;  [facility];  Service: Cardiovascular;  Laterality: N/A;   Right total knee     2018 Dr. Ernie   RIGHT/LEFT HEART CATH AND CORONARY ANGIOGRAPHY N/A 08/06/2018   Procedure: RIGHT/LEFT HEART CATH AND CORONARY ANGIOGRAPHY;  Surgeon: Ladona Heinz, MD;  [facility];  Service: Cardiovascular;  Laterality: N/A;   THYROIDECTOMY, PARTIAL  1975   TONSILLECTOMY     as a child - patient not sure of exact date   TOTAL KNEE ARTHROPLASTY Left 03/13/2016   Procedure: TOTAL KNEE ARTHROPLASTY;  Surgeon: Donnice Ernie, MD;  Location: [facility] ORS;  Service: Orthopedics;  Laterality: Left;   TOTAL KNEE ARTHROPLASTY Right 06/18/2017   Procedure: RIGHT TOTAL KNEE ARTHROPLASTY;  Surgeon: Ernie Donnice, MD;  Location: [facility] ORS;  Service: Orthopedics;  Laterality: Right;   TRANSCATHETER AORTIC VALVE REPLACEMENT, TRANSFEMORAL N/A 09/03/2018   Procedure: TRANSCATHETER AORTIC VALVE REPLACEMENT, TRANSFEMORAL;  Surgeon: Verlin Lonni BIRCH, MD;  Location: [facility] OR;  Service: Open Heart Surgery;  Laterality: N/A;     Family History  Problem Relation Age of Onset   Diabetes Mother    Stroke Mother         Carotid artery disease   Heart disease Father        CAD   Coronary artery disease Father    Diabetes Sister      Social History   Socioeconomic History   Marital status: Widowed    Spouse name: Not on file   Number of children: 4   Years of education: Not on file   Highest education level: Master's degree (e.g., MA, MS, MEng, MEd, MSW, MBA)  Occupational History   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  Tobacco Use   Smoking status: Never   Smokeless tobacco:  Never  Vaping Use   Vaping status: Never Used  Substance and Sexual Activity   Alcohol  use: No   Drug use: No   Sexual activity: Not Currently  Other Topics Concern   Not on file  Social History Narrative   Not on file   Social Drivers of Health   Financial Resource Strain: Low Risk  (11/12/2018)   Overall Financial Resource Strain (CARDIA)    Difficulty of Paying Living Expenses: Not very hard  Food Insecurity: No Food Insecurity (04/01/2024)   Hunger Vital Sign    Worried About Running Out of Food in the Last Year: Never true    Ran Out of Food in the Last Year: Never true  Transportation Needs: No Transportation Needs (04/01/2024)   PRAPARE - Administrator, Civil Service (Medical): No    Lack of Transportation (Non-Medical): No  Physical Activity: Inactive (11/12/2018)   Exercise Vital Sign    Days of Exercise per Week: 0 days    Minutes of Exercise per Session: 0 min  Stress: Stress Concern Present (11/12/2018)   [employer] of Occupational Health - Occupational Stress Questionnaire    Feeling of Stress : To some extent  Social Connections: Moderately Isolated (04/01/2024)   Social Connection and Isolation Panel    Frequency of Communication with Friends and Family: More than three times a week    Frequency of Social Gatherings with Friends and Family: Once a week    Attends Religious Services: Never    Database administrator or Organizations: No    Attends  Banker Meetings: 1 to 4 times per year    Marital Status: Widowed  Intimate Partner Violence: Not At Risk (04/01/2024)   Humiliation, Afraid, Rape, and Kick questionnaire    Fear of Current or Ex-Partner: No    Emotionally Abused: No    Physically Abused: No    Sexually Abused: No     BP 103/64   Pulse 72   Ht 5' 3 (1.6 m)   Wt 171 lb 14.4 oz (78 kg)   SpO2 96%   BMI 30.45 kg/m   Physical Exam:  Well appearing NAD HEENT: Unremarkable Neck:  No JVD, no thyromegally Lymphatics:  No adenopathy Back:  No CVA tenderness Lungs:  Clear HEART:  Regular rate rhythm, no murmurs, no rubs, no clicks Abd:  soft, positive bowel sounds, no organomegally, no rebound, no guarding Ext:  2 plus pulses, no edema, no cyanosis, no clubbing Skin:  No rashes no nodules Neuro:  CN II through XII intact, motor grossly intact  EKG  DEVICE  Normal device function.  See PaceArt for details.   Assess/Plan: [place] syncope - she has CHB today and is asymptomatic s/p PPM. TIA - she has not had any atrial fib on her PPM. We will follow. PPM -  Her medtronic DDD PM is working normally. No change. We will recheck in several months.  Danelle Merrick Feutz,MD

## 2024-06-05 ENCOUNTER — Other Ambulatory Visit: Payer: Self-pay | Admitting: *Deleted

## 2024-06-05 DIAGNOSIS — C9000 Multiple myeloma not having achieved remission: Secondary | ICD-10-CM

## 2024-06-05 MED ORDER — LENALIDOMIDE 5 MG PO CAPS: 5.0000 mg | ORAL_CAPSULE | Freq: Every day | ORAL | 0 refills | Status: DC

## 2024-06-09 DIAGNOSIS — R059 Cough, unspecified: Secondary | ICD-10-CM | POA: Diagnosis not present

## 2024-06-16 ENCOUNTER — Encounter

## 2024-06-16 ENCOUNTER — Inpatient Hospital Stay (HOSPITAL_BASED_OUTPATIENT_CLINIC_OR_DEPARTMENT_OTHER): Admitting: Hematology and Oncology

## 2024-06-16 ENCOUNTER — Telehealth: Payer: Self-pay | Admitting: Hematology and Oncology

## 2024-06-16 ENCOUNTER — Inpatient Hospital Stay: Payer: Self-pay | Attending: Physician Assistant

## 2024-06-16 ENCOUNTER — Inpatient Hospital Stay: Payer: Self-pay | Admitting: Hematology and Oncology

## 2024-06-16 VITALS — BP 124/74 | HR 59 | Temp 97.9°F | Resp 17 | Ht 63.0 in | Wt 171.4 lb

## 2024-06-16 DIAGNOSIS — D696 Thrombocytopenia, unspecified: Secondary | ICD-10-CM

## 2024-06-16 DIAGNOSIS — Z79899 Other long term (current) drug therapy: Secondary | ICD-10-CM | POA: Insufficient documentation

## 2024-06-16 DIAGNOSIS — I4891 Unspecified atrial fibrillation: Secondary | ICD-10-CM | POA: Diagnosis not present

## 2024-06-16 DIAGNOSIS — C9000 Multiple myeloma not having achieved remission: Secondary | ICD-10-CM

## 2024-06-16 DIAGNOSIS — D649 Anemia, unspecified: Secondary | ICD-10-CM | POA: Insufficient documentation

## 2024-06-16 DIAGNOSIS — G629 Polyneuropathy, unspecified: Secondary | ICD-10-CM | POA: Diagnosis not present

## 2024-06-16 DIAGNOSIS — Z7901 Long term (current) use of anticoagulants: Secondary | ICD-10-CM | POA: Diagnosis not present

## 2024-06-16 DIAGNOSIS — E538 Deficiency of other specified B group vitamins: Secondary | ICD-10-CM | POA: Insufficient documentation

## 2024-06-16 LAB — CBC WITH DIFFERENTIAL (CANCER CENTER ONLY)
Abs Immature Granulocytes: 0.02 K/uL (ref 0.00–0.07)
Basophils Absolute: 0.1 K/uL (ref 0.0–0.1)
Basophils Relative: 1 %
Eosinophils Absolute: 0.1 K/uL (ref 0.0–0.5)
Eosinophils Relative: 1 %
HCT: 32.7 % — ABNORMAL LOW (ref 36.0–46.0)
Hemoglobin: 10.9 g/dL — ABNORMAL LOW (ref 12.0–15.0)
Immature Granulocytes: 0 %
Lymphocytes Relative: 15 %
Lymphs Abs: 0.9 K/uL (ref 0.7–4.0)
MCH: 28.5 pg (ref 26.0–34.0)
MCHC: 33.3 g/dL (ref 30.0–36.0)
MCV: 85.6 fL (ref 80.0–100.0)
Monocytes Absolute: 1 K/uL (ref 0.1–1.0)
Monocytes Relative: 16 %
Neutro Abs: 4.3 K/uL (ref 1.7–7.7)
Neutrophils Relative %: 67 %
Platelet Count: 178 K/uL (ref 150–400)
RBC: 3.82 MIL/uL — ABNORMAL LOW (ref 3.87–5.11)
RDW: 16.4 % — ABNORMAL HIGH (ref 11.5–15.5)
WBC Count: 6.4 K/uL (ref 4.0–10.5)
nRBC: 0 % (ref 0.0–0.2)

## 2024-06-16 LAB — LACTATE DEHYDROGENASE: LDH: 225 U/L — ABNORMAL HIGH (ref 98–192)

## 2024-06-16 LAB — CMP (CANCER CENTER ONLY)
ALT: 8 U/L (ref 0–44)
AST: 13 U/L — ABNORMAL LOW (ref 15–41)
Albumin: 3.5 g/dL (ref 3.5–5.0)
Alkaline Phosphatase: 88 U/L (ref 38–126)
Anion gap: 7 (ref 5–15)
BUN: 24 mg/dL — ABNORMAL HIGH (ref 8–23)
CO2: 28 mmol/L (ref 22–32)
Calcium: 8.5 mg/dL — ABNORMAL LOW (ref 8.9–10.3)
Chloride: 101 mmol/L (ref 98–111)
Creatinine: 1.12 mg/dL — ABNORMAL HIGH (ref 0.44–1.00)
GFR, Estimated: 47 mL/min — ABNORMAL LOW (ref >60)
Glucose, Bld: 115 mg/dL — ABNORMAL HIGH (ref 70–99)
Potassium: 3.2 mmol/L — ABNORMAL LOW (ref 3.5–5.1)
Sodium: 136 mmol/L (ref 135–145)
Total Bilirubin: 0.5 mg/dL (ref 0.0–1.2)
Total Protein: 6.5 g/dL (ref 6.5–8.1)

## 2024-06-16 NOTE — Progress Notes (Signed)
 [facility] [phone]   [phone]   PROGRESS NOTE   Patient Care Team: [facility] as PCP - General (Rheumatology)   Hematological/Oncological History # IgG Lambda Multiple Myeloma  06/14/2022: establish care with [facility] due to anemia. Labs showed M protein 4.9, Kappa 7.2, Lambda 10.8, ratio 0.67 07/13/2022: Bmbx showed Lambda restricted plasma cell neoplasm involving approximately 60% of the cellular marrow by IHC on the biopsy.  08/01/2022: Cycle 1 Day 1 of VRd chemotherapy. (Holding revlimid  initially) 08/21/2022:  Cycle 2 Day 1 of VRd chemotherapy. (Holding revlimid ) 09/04/2022: Cycle 3 Day 1 of VRd chemotherapy. Started revlimid  10/03/2022: Cycle 4 Day 1 of VRd chemotherapy 10/31/2022: Cycle 5 Day 1 of VRd chemotherapy 11/20/2022: Cycle 6 Day 1 of VRd chemotherapy 12/11/2022: Cycle 7 Day 1 of VRd chemotherapy 01/02/2023: Cycle 8 Day 1 of VRd chemotherapy 01/22/2023: Cycle 9 Day 1 of VRd chemotherapy 02/23/2023: Day 1 Cycle 1 of Dara/Rev/Dex 03/23/2023: Day 1 Cycle 2 of Dara/Rev/Dex 05/06/2023: chemotherapy on pause due to chest pain/cardiac/respiratory issues.  05/18/2023: Cycle 3 Day 1 of Dara/Rev/Dex 06/15/2023: Cycle 4 Day 1 of Dara/Rev/Dex 07/13/2023: Cycle 5 Day 1 of Dara/Rev/Dex 08/10/2023: Cycle 6 Day 1 of Dara/Rev/Dex. Transition to maintenance revlimid .   INTERVAL HISTORY: Zoe [name] 89 y.o. female returns to the clinic today for a follow-up visit accompanied by a friend.   Her last visit was on 05/19/2024. In the interim, she has had no major changes in her health.   Zoe [name] reports she has had no major changes in her health in the interim since our last visit.  She reports that she had issues with a viral pneumonia and a pneumonia for which she was taking a cough pill and Tessalon  Perles.  She reports that is working some.  She still having some nasal drainage and some deep chest cough.  She reports that she  has been told she is not contagious.  She reports her energy levels are good though she is a little tired from time to time.  She is avoiding social activities because of her recent viral illness.  She notes that she is taking her Revlimid  faithfully and is not having any major side effects other than some mild nausea but no vomiting, diarrhea, or skin rash.  She reports that she typically has a nausea immediately after breakfast.  She notes that her dentist was not familiar with Zometa and she is currently working towards getting this approved.  She notes that once this is evaluated she will have this faxed to us .  Otherwise she is willing and able to continue on her Revlimid  treatment.  A full 10 point ROS is otherwise negative.  MEDICAL HISTORY: Past Medical History:  Diagnosis Date   Arthritis    some - per patient   Atrial fibrillation (HCC)    Breast cancer (HCC)    breast cancer / left    Cataract    bilat    GERD (gastroesophageal reflux disease)    History of kidney stones    Hyperlipidemia    Hypertension    Hypothyroidism    Macular degeneration    Left   S/P TAVR (transcatheter aortic valve replacement) 09/03/2018   23 mm Edwards Sapien 3 transcatheter heart valve placed via percutaneous right transfemoral approach    Severe aortic stenosis    Stress incontinence    Thyroid  disease    Tinnitus     ALLERGIES:  is allergic to quinolones, penicillins,  and sulfa antibiotics.  MEDICATIONS:  Current Outpatient Medications  Medication Sig Dispense Refill   acetaminophen  (TYLENOL ) 500 MG tablet Take 1,000 mg by mouth every 6 (six) hours as needed.     albuterol  (VENTOLIN  HFA) 108 (90 Base) MCG/ACT inhaler Inhale 2 puffs into the lungs every 6 (six) hours as needed for wheezing or shortness of breath.     amiodarone  (PACERONE ) 100 MG tablet Take 100 mg by mouth daily.     amLODipine  (NORVASC ) 5 MG tablet Take 5 mg by mouth daily.     apixaban  (ELIQUIS ) 5 MG TABS tablet Take 1  tablet (5 mg total) by mouth 2 (two) times daily. 180 tablet 1   Artificial Tear Solution (SOOTHE XP) SOLN Place 1 drop into both eyes every evening.     Cholecalciferol  (VITAMIN D3) 50 MCG (2000 UT) capsule Take 1 capsule (2,000 Units total) by mouth daily.     CVS VITAMIN B12 1000 MCG tablet TAKE 1 TABLET BY MOUTH EVERY DAY 90 tablet 1   furosemide  (LASIX ) 20 MG tablet Three times a week as needed; take potassium if taking lasix      lenalidomide  (REVLIMID ) 5 MG capsule Take 1 capsule (5 mg total) by mouth daily. Take 1 daily for 21 days then none for 7 days [employer] [id number]; date obtained 06/05/24 21 capsule 0   levothyroxine  (SYNTHROID , LEVOTHROID) 100 MCG tablet Take 100 mcg by mouth daily before breakfast.  2   losartan  (COZAAR ) 50 MG tablet Take 1 tablet (50 mg total) by mouth daily.     metoprolol  tartrate (LOPRESSOR ) 50 MG tablet Take 50 mg by mouth 2 (two) times daily.     Multiple Vitamins-Minerals (PRESERVISION AREDS) CAPS Take 1 capsule by mouth 2 (two) times daily.     pantoprazole  (PROTONIX ) 40 MG tablet Take 1 tablet (40 mg total) by mouth daily.     potassium chloride  SA (KLOR-CON  M) 20 MEQ tablet Three times a week as needed only if taking lasix      rosuvastatin  (CRESTOR ) 20 MG tablet Take 1 tablet (20 mg total) by mouth daily. 90 tablet 3   No current facility-administered medications for this visit.    SURGICAL HISTORY:  Past Surgical History:  Procedure Laterality Date   ABDOMINAL HYSTERECTOMY  1970's   BACK SURGERY     BREAST LUMPECTOMY  12/1998   lumpectomy   CARDIAC CATHETERIZATION     CARDIOVERSION N/A 04/18/2023   Procedure: CARDIOVERSION;  Surgeon: Michele Richardson, DO;  [facility]: [facility] INV[facility] CV LAB;  Service: Cardiovascular;  Laterality: N/A;   CARDIOVERSION N/A 04/20/2023   Procedure: CARDIOVERSION;  Surgeon: Ladona Heinz, MD;  [facility]: [facility] INV[facility] CV LAB;  Service: Cardiovascular;  Laterality: N/A;   EYE SURGERY     cataract surgery bilat    INTRAOPERATIVE  TRANSTHORACIC ECHOCARDIOGRAM N/A 09/03/2018   Procedure: INTRAOPERATIVE TRANSTHORACIC ECHOCARDIOGRAM;  Surgeon: Verlin Lonni BIRCH, MD;  Location: [facility] OR;  Service: Open Heart Surgery;  Laterality: N/A;   KYPHOPLASTY N/A 09/07/2022   Procedure: THORACIC EIGHT KYPHOPLASTY;  Surgeon: Burnetta Aures, MD;  Location: [facility] OR;  Service: Orthopedics;  Laterality: N/A;  1 hr Local with IV Regional 3 C-Bed   LITHOTRIPSY     PACEMAKER IMPLANT N/A 01/09/2024   Procedure: PACEMAKER IMPLANT;  Surgeon: Waddell Danelle ORN, MD;  [facility]: [facility];  Service: Cardiovascular;  Laterality: N/A;   Right total knee     2018 Dr. Ernie   RIGHT/LEFT HEART CATH AND CORONARY ANGIOGRAPHY N/A 08/06/2018  Procedure: RIGHT/LEFT HEART CATH AND CORONARY ANGIOGRAPHY;  Surgeon: Ladona Heinz, MD;  [facility]: [facility] IN[facility];  Service: Cardiovascular;  Laterality: N/A;   THYROIDECTOMY, PARTIAL  1975   TONSILLECTOMY     as a child - patient not sure of exact date   TOTAL KNEE ARTHROPLASTY Left 03/13/2016   Procedure: TOTAL KNEE ARTHROPLASTY;  Surgeon: Donnice Car, MD;  Location: [facility] ORS;  Service: Orthopedics;  Laterality: Left;   TOTAL KNEE ARTHROPLASTY Right 06/18/2017   Procedure: RIGHT TOTAL KNEE ARTHROPLASTY;  Surgeon: Car Donnice, MD;  Location: [facility] ORS;  Service: Orthopedics;  Laterality: Right;   TRANSCATHETER AORTIC VALVE REPLACEMENT, TRANSFEMORAL N/A 09/03/2018   Procedure: TRANSCATHETER AORTIC VALVE REPLACEMENT, TRANSFEMORAL;  Surgeon: Verlin Lonni BIRCH, MD;  Location: [facility] OR;  Service: Open Heart Surgery;  Laterality: N/A;   REVIEW OF SYSTEMS:   Constitutional: ( - ) fevers, ( - )  chills , ( - ) night sweats Eyes: ( - ) blurriness of vision, ( - ) double vision, ( - ) watery eyes Ears, nose, mouth, throat, and face: ( - ) mucositis, ( - ) sore throat Respiratory: ( -) cough, ( - ) dyspnea, ( - ) wheezes Cardiovascular: ( - ) palpitation, ( - ) chest discomfort, ( +) lower extremity  swelling Gastrointestinal:  ( - ) nausea, ( - ) heartburn, ( - ) change in bowel habits Skin: ( - ) abnormal skin rashes Lymphatics: ( - ) new lymphadenopathy, ( - ) easy bruising Neurological: ( - ) numbness, ( - ) tingling, ( - ) new weaknesses Behavioral/Psych: ( - ) mood change, ( - ) new changes  All other systems were reviewed with the patient and are negative.   PHYSICAL EXAMINATION:  Blood pressure 124/74, pulse (!) 59, temperature 97.9 F (36.6 C), temperature source Temporal, resp. rate 17, height 5' 3 (1.6 m), weight 171 lb 6 oz (77.7 kg), SpO2 98%.  ECOG PERFORMANCE STATUS: 1  GENERAL: well appearing elderly Caucasian female, alert, no distress and comfortable SKIN: skin color, texture, turgor are normal, no rashes or significant lesions EYES: conjunctiva are pink and non-injected, sclera clear LUNGS:normal breathing effort.  HEART: irregular rhythm ,normal rate and no murmurs.  Musculoskeletal: no cyanosis of digits and no clubbing  PSYCH: alert & oriented x 3, fluent speech NEURO: no focal motor/sensory deficits    LABORATORY DATA: Lab Results  Component Value Date   WBC 6.4 06/16/2024   HGB 10.9 (L) 06/16/2024   HCT 32.7 (L) 06/16/2024   MCV 85.6 06/16/2024   PLT 178 06/16/2024      Chemistry      Component Value Date/Time   NA 136 06/16/2024 1036   NA 130 (L) 06/19/2022 1012   K 3.2 (L) 06/16/2024 1036   CL 101 06/16/2024 1036   CO2 28 06/16/2024 1036   BUN 24 (H) 06/16/2024 1036   BUN 21 06/19/2022 1012   CREATININE 1.12 (H) 06/16/2024 1036      Component Value Date/Time   CALCIUM  8.5 (L) 06/16/2024 1036   ALKPHOS 88 06/16/2024 1036   AST 13 (L) 06/16/2024 1036   ALT 8 06/16/2024 1036   BILITOT 0.5 06/16/2024 1036       RADIOGRAPHIC STUDIES:  CUP PACEART INCLINIC DEVICE CHECK Result Date: 05/28/2024 Normal in-clinic dual chamber pacemaker check. Presenting Rhythm: AS/VP 69 . Routine testing of thresholds, sensing, and impedance demonstrate  stable parameters.  Pt with no escape at 30 bpm-reprogrammed AV delays to 160/180 ms.  Afib  burden 0.3%. Estimated longevity 13 years . Pt enrolled in remote follow-up.Randall Sharps, BSN, RN  CUP PACEART REMOTE DEVICE CHECK Result Date: 05/27/2024 PPM Scheduled remote reviewed. Normal device function.  Presenting rhythm:  AS/VP 1 AF event 5/12, duration 7hrs , burden 0.3%, Eliquis  per EPIC Next remote 91 days. LA, CVRS    ASSESSMENT/PLAN:  Zoe [name] is a 89 y.o. female who presents to the clinic for evaluation for IgG lambda multiple myeloma.   # IgG Lambda Multiple Myeloma  --diagnosis of MM confirmed with bone marrow biopsy showing 60% plasma cells and anemia -- Bone survey shows no lytic lesions, kidney function is within normal limits.  --08/01/2022 was Cycle 1 Day 1 of VRd  - discontinued velcade  on 02/05/23 due to rash. She is status post day 15 cycle #9.  -changed the care plan to Daratumumab , Revlimid , and decadron . She is scheduled for day 1 cycle #1 today  -Started Cycle 1, Day 1 of Dara/Rev/Dex on 02/23/2023.  Plan:  --Labs from today show white blood cell 6.4, hemoglobin 10.9, MCV 85.6, platelets 178. LFTs normal.  Multiple myeloma panel pending today. -- Continue Revlimid  5 mg daily 21 days of 28-day cycle.  --RTC in 4 weeks for labs and clinic visit and to reevaluate how she is doing on Revlimid .  #Hypotension/BP/Atrial Fibrillation  --Follows with cardiology, Dr. Ladona --Was recently admitted from 01/08/2024 through 01/14/2024 after having syncopal episode and found to have complete heart block.  She underwent pacemaker placement. --Patient's blood pressure is 110/80 today which is below her baseline. --Advised to check the blood pressure in the morning before taking her antihypertensives and follow-up with Dr. Ladona if her blood pressure is low or she is symptomatic. --Continue on Eliquis  5 mg BID   # Normocytic Anemia #Vitamin B12 Deficiency --Anemia likely  driven by multiple myeloma, but may be component of vitamin B12 deficiency as well. --initial labs showed elevated MMA with B12 180 -- Continue vitamin B12 1000 mcg p.o. daily -- Continue to monitor   # H/O Left lower extremity neuropathy: --Currently on gabapentin  900 mg nightly and 600 mg in the morning --Encouraged to try water  aerobics and stretches.  --Following with Dr. Bobie (ortho) to see if she is a candidate for steroid injections.  -- Patient evaluated by Dr. Buckley thinks that this may be neuropathy worsened by her Velcade  therapy.   #Episode of right UE tremor and speech abnormality: --Evaluated by Dr. Buckley on 03/20/2023. Presumed etiology had been atrial fibrillation, but vascular disease, atheromatous changes, are also possibly implicated concurrently.  --MRI brain from 03/13/2023 show no acute finding including no evidence of intracranial neoplastic disease. There are chronic small-vessel ischemic changes of the pons, cerebral hemispheric white matter and right cerebellum. --Educated patient to seek immediate evaluate if there are repeat episodes. --No neurological deficits identified per exam today  #Supportive Care -- chemotherapy education complete.  -- port placement not required.  -- zofran  8mg  q8H PRN and compazine  10mg  PO q6H for nausea -- acyclovir  400mg  PO BID for VCZ prophylaxis -- tylenol  1000 mg q8H PRN for back pain.   No orders of the defined types were placed in this encounter.   Patient expressed understanding of the plan provided.   I have spent a total of 30 minutes minutes of face-to-face and non-face-to-face time, preparing to see the patient, performing a medically appropriate examination, counseling and educating the patient, ordering tests/procedures, documenting clinical information in the electronic health record, and care coordination.  Norleen IVAR Kidney, MD Department of Hematology/Oncology [facility] Phone: [phone] Pager: [phone] Email: [email or web address]

## 2024-06-16 NOTE — Progress Notes (Deleted)
 [facility] [phone]   [phone]   PROGRESS NOTE   Patient Care Team: [facility] as PCP - General (Rheumatology)   Hematological/Oncological History # IgG Lambda Multiple Myeloma  06/14/2022: establish care with [employer] [facility] due to anemia. Labs showed M protein 4.9, Kappa 7.2, Lambda 10.8, ratio 0.67 07/13/2022: Bmbx showed Lambda restricted plasma cell neoplasm involving approximately 60% of the cellular marrow by IHC on the biopsy.  08/01/2022: Cycle 1 Day 1 of VRd chemotherapy. (Holding revlimid  initially) 08/21/2022:  Cycle 2 Day 1 of VRd chemotherapy. (Holding revlimid ) 09/04/2022: Cycle 3 Day 1 of VRd chemotherapy. Started revlimid  10/03/2022: Cycle 4 Day 1 of VRd chemotherapy 10/31/2022: Cycle 5 Day 1 of VRd chemotherapy 11/20/2022: Cycle 6 Day 1 of VRd chemotherapy 12/11/2022: Cycle 7 Day 1 of VRd chemotherapy 01/02/2023: Cycle 8 Day 1 of VRd chemotherapy 01/22/2023: Cycle 9 Day 1 of VRd chemotherapy 02/23/2023: Day 1 Cycle 1 of Dara/Rev/Dex 03/23/2023: Day 1 Cycle 2 of Dara/Rev/Dex 05/06/2023: chemotherapy on pause due to chest pain/cardiac/respiratory issues.  05/18/2023: Cycle 3 Day 1 of Dara/Rev/Dex 06/15/2023: Cycle 4 Day 1 of Dara/Rev/Dex 07/13/2023: Cycle 5 Day 1 of Dara/Rev/Dex 08/10/2023: Cycle 6 Day 1 of Dara/Rev/Dex. Transition to maintenance revlimid .   INTERVAL HISTORY: Zoe  B [name] 89 y.o. female returns to the clinic today for a follow-up visit accompanied by her daughter-in-law.   Her last visit was on 04/21/2024. In the interim, she has had no major changes in her health.   Zoe [name] reports she has been well overall with them since her last visit and using the walker to try to keep her self steady.  She reports that she does like it there but it can get lonely sometimes.  She reports there is decent food and many activities.  Her appetite remains strong.  Her energy is fair.  And she does rest a lot.   She notes that she does have some pain in her neck recently and has been using a heating pad and Tylenol .  She is not having any additional bone pain.  She notes that she is not having any difficulty with her Revlimid  pills at the 5 mg dosage.  She reports that she is working towards getting dental clearance for her Zometa infusions.  Otherwise she feels well and has no questions concerns or complaints today.  She is willing and able to continue Revlimid  at this time.  A full 10 point ROS is otherwise negative.  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  MEDICATIONS:  Current Outpatient Medications  Medication Sig Dispense Refill   acetaminophen  (TYLENOL ) 500 MG tablet Take 1,000 mg by mouth every 6 (six) hours as needed.     albuterol  (VENTOLIN   HFA) 108 (90 Base) MCG/ACT inhaler Inhale 2 puffs into the lungs every 6 (six) hours as needed for wheezing or shortness of breath.     amiodarone  (PACERONE ) 100 MG tablet Take 100 mg by mouth daily.     amLODipine  (NORVASC ) 5 MG tablet Take 5 mg by mouth daily.     apixaban  (ELIQUIS ) 5 MG TABS tablet Take 1 tablet (5 mg total) by mouth 2 (two) times daily. 180 tablet 1   Artificial Tear Solution (SOOTHE XP) SOLN Place 1 drop into both eyes every evening.     Cholecalciferol  (VITAMIN D3) 50 MCG (2000 UT) capsule Take 1 capsule  (2,000 Units total) by mouth daily.     [employer] VITAMIN B12 1000 MCG tablet TAKE 1 TABLET BY MOUTH EVERY DAY 90 tablet 1   furosemide  (LASIX ) 20 MG tablet Three times a week as needed; take potassium if taking lasix      lenalidomide  (REVLIMID ) 5 MG capsule Take 1 capsule (5 mg total) by mouth daily. Take 1 daily for 21 days then none for 7 days [employer] [id number]; date obtained 06/05/24 21 capsule 0   levothyroxine  (SYNTHROID , LEVOTHROID) 100 MCG tablet Take 100 mcg by mouth daily before breakfast.  2   losartan  (COZAAR ) 50 MG tablet Take 1 tablet (50 mg total) by mouth daily.     metoprolol  tartrate (LOPRESSOR ) 50 MG tablet Take 50 mg by mouth 2 (two) times daily.     Multiple Vitamins-Minerals (PRESERVISION AREDS) CAPS Take 1 capsule by mouth 2 (two) times daily.     pantoprazole  (PROTONIX ) 40 MG tablet Take 1 tablet (40 mg total) by mouth daily.     potassium chloride  SA (KLOR-CON  M) 20 MEQ tablet Three times a week as needed only if taking lasix      rosuvastatin  (CRESTOR ) 20 MG tablet Take 1 tablet (20 mg total) by mouth daily. 90 tablet 3   No current facility-administered medications for this visit.    SURGICAL HISTORY:  Past Surgical History:  Procedure Laterality Date   ABDOMINAL HYSTERECTOMY  1970's   BACK SURGERY     BREAST LUMPECTOMY  12/1998   lumpectomy   CARDIAC CATHETERIZATION     CARDIOVERSION N/A 04/18/2023   Procedure: CARDIOVERSION;  Surgeon: Michele Richardson, DO;  Location: [facility];  Service: Cardiovascular;  Laterality: N/A;   CARDIOVERSION N/A 04/20/2023   Procedure: CARDIOVERSION;  Surgeon: Ladona Heinz, MD;  Location: [facility];  Service: Cardiovascular;  Laterality: N/A;   EYE SURGERY     cataract surgery bilat    INTRAOPERATIVE TRANSTHORACIC ECHOCARDIOGRAM N/A 09/03/2018   Procedure: INTRAOPERATIVE TRANSTHORACIC ECHOCARDIOGRAM;  Surgeon: Verlin Lonni BIRCH, MD;  Location: [facility];  Service: Open Heart Surgery;  Laterality: N/A;   KYPHOPLASTY N/A  09/07/2022   Procedure: THORACIC EIGHT KYPHOPLASTY;  Surgeon: Burnetta Aures, MD;  Location: [facility] OR;  Service: Orthopedics;  Laterality: N/A;  1 hr Local with [facility] 3 C-Bed   LITHOTRIPSY     PACEMAKER IMPLANT N/A 01/09/2024   Procedure: PACEMAKER IMPLANT;  Surgeon: Waddell Danelle ORN, MD;  Location: [facility];  Service: Cardiovascular;  Laterality: N/A;   Right total knee     2018 Dr. Ernie   RIGHT/LEFT HEART CATH AND CORONARY ANGIOGRAPHY N/A 08/06/2018   Procedure: RIGHT/LEFT HEART CATH AND CORONARY ANGIOGRAPHY;  Surgeon: Ladona Heinz, MD;  Location: [facility];  Service: Cardiovascular;  Laterality: N/A;   THYROIDECTOMY, PARTIAL  1975   TONSILLECTOMY     as  a child - patient not sure of exact date   TOTAL KNEE ARTHROPLASTY Left 03/13/2016   Procedure: TOTAL KNEE ARTHROPLASTY;  Surgeon: Donnice Car, MD;  Location: [facility];  Service: Orthopedics;  Laterality: Left;   TOTAL KNEE ARTHROPLASTY Right 06/18/2017   Procedure: RIGHT TOTAL KNEE ARTHROPLASTY;  Surgeon: Car Donnice, MD;  Location: [facility];  Service: Orthopedics;  Laterality: Right;   TRANSCATHETER AORTIC VALVE REPLACEMENT, TRANSFEMORAL N/A 09/03/2018   Procedure: TRANSCATHETER AORTIC VALVE REPLACEMENT, TRANSFEMORAL;  Surgeon: Verlin Lonni BIRCH, MD;  Location: [facility];  Service: Open Heart Surgery;  Laterality: N/A;   REVIEW OF SYSTEMS:   Constitutional: ( - ) fevers, ( - )  chills , ( - ) night sweats Eyes: ( - ) blurriness of vision, ( - ) double vision, ( - ) watery eyes Ears, nose, mouth, throat, and face: ( - ) mucositis, ( - ) sore throat Respiratory: ( -) cough, ( - ) dyspnea, ( - ) wheezes Cardiovascular: ( - ) palpitation, ( - ) chest discomfort, ( +) lower extremity swelling Gastrointestinal:  ( - ) nausea, ( - ) heartburn, ( - ) change in bowel habits Skin: ( - ) abnormal skin rashes Lymphatics: ( - ) new lymphadenopathy, ( - ) easy bruising Neurological: ( - ) numbness, ( - ) tingling, ( - ) new  weaknesses Behavioral/Psych: ( - ) mood change, ( - ) new changes  All other systems were reviewed with the patient and are negative.   PHYSICAL EXAMINATION:  There were no vitals taken for this visit.  ECOG PERFORMANCE STATUS: 1  GENERAL: well appearing elderly Caucasian female, alert, no distress and comfortable SKIN: skin color, texture, turgor are normal, no rashes or significant lesions EYES: conjunctiva are pink and non-injected, sclera clear LUNGS:normal breathing effort.  HEART: irregular rhythm ,normal rate and no murmurs.  Musculoskeletal: no cyanosis of digits and no clubbing  PSYCH: alert & oriented x 3, fluent speech NEURO: no focal motor/sensory deficits    LABORATORY DATA: Lab Results  Component Value Date   WBC 6.6 05/19/2024   HGB 11.9 (L) 05/19/2024   HCT 36.1 05/19/2024   MCV 87.4 05/19/2024   PLT 157 05/19/2024      Chemistry      Component Value Date/Time   NA 138 05/19/2024 1014   NA 130 (L) 06/19/2022 1012   K 3.5 05/19/2024 1014   CL 103 05/19/2024 1014   CO2 28 05/19/2024 1014   BUN 27 (H) 05/19/2024 1014   BUN 21 06/19/2022 1012   CREATININE 1.15 (H) 05/19/2024 1014      Component Value Date/Time   CALCIUM  8.7 (L) 05/19/2024 1014   ALKPHOS 84 05/19/2024 1014   AST 12 (L) 05/19/2024 1014   ALT 8 05/19/2024 1014   BILITOT 0.4 05/19/2024 1014       RADIOGRAPHIC STUDIES:  CUP PACEART INCLINIC DEVICE CHECK Result Date: 05/28/2024 Normal in-clinic dual chamber pacemaker check. Presenting Rhythm: AS/VP 69 . Routine testing of thresholds, sensing, and impedance demonstrate stable parameters.  Pt with no escape at 30 bpm-reprogrammed AV delays to 160/180 ms.  Afib burden 0.3%. Estimated longevity 13 years . Pt enrolled in remote follow-up.Randall Sharps, BSN, RN  CUP PACEART REMOTE DEVICE CHECK Result Date: 05/27/2024 PPM Scheduled remote reviewed. Normal device function.  Presenting rhythm:  AS/VP 1 AF event 5/12, duration 7hrs , burden  0.3%, Eliquis  per EPIC Next remote 91 days. LA, CV[facility]    ASSESSMENT/PLAN:  Zoe  B [name] is  a 89 y.o. female who presents to the clinic for evaluation for IgG lambda multiple myeloma.   # IgG Lambda Multiple Myeloma  --diagnosis of MM confirmed with bone marrow biopsy showing 60% plasma cells and anemia -- Bone survey shows no lytic lesions, kidney function is within normal limits.  --08/01/2022 was Cycle 1 Day 1 of VRd  - discontinued velcade  on 02/05/23 due to rash. She is status post day 15 cycle #9.  -changed the care plan to Daratumumab , Revlimid , and decadron . She is scheduled for day 1 cycle #1 today  -Started Cycle 1, Day 1 of Dara/Rev/Dex on 02/23/2023.  Plan:  --Labs from today show white blood cell 6.6, hemoglobin 1.9, MCV 87.4, platelets 157. LFTs normal.  Multiple myeloma panel pending today. --Will restart Revlimid  5 mg daily 21 days of 28-day cycle.  --RTC in 4 weeks for labs and clinic visit and to reevaluate how she is doing on Revlimid .  #Hypotension/BP/Atrial Fibrillation  --Follows with cardiology, Dr. Ladona --Was recently admitted from 01/08/2024 through 01/14/2024 after having syncopal episode and found to have complete heart block.  She underwent pacemaker placement. --Patient's blood pressure is 110/80 today which is below her baseline. --Advised to check the blood pressure in the morning before taking her antihypertensives and follow-up with Dr. Ladona if her blood pressure is low or she is symptomatic. --Continue on Eliquis  5 mg BID   # Normocytic Anemia #Vitamin B12 Deficiency --Anemia likely driven by multiple myeloma, but may be component of vitamin B12 deficiency as well. --initial labs showed elevated MMA with B12 180 -- Continue vitamin B12 1000 mcg p.o. daily -- Continue to monitor   # H/O Left lower extremity neuropathy: --Currently on gabapentin  900 mg nightly and 600 mg in the morning --Encouraged to try water  aerobics and stretches.  --Following  with Dr. Bobie (ortho) to see if she is a candidate for steroid injections.  -- Patient evaluated by Dr. Buckley thinks that this may be neuropathy worsened by her Velcade  therapy.   #Episode of right UE tremor and speech abnormality: --Evaluated by Dr. Buckley on 03/20/2023. Presumed etiology had been atrial fibrillation, but vascular disease, atheromatous changes, are also possibly implicated concurrently.  --MRI brain from 03/13/2023 show no acute finding including no evidence of intracranial neoplastic disease. There are chronic small-vessel ischemic changes of the pons, cerebral hemispheric white matter and right cerebellum. --Educated patient to seek immediate evaluate if there are repeat episodes. --No neurological deficits identified per exam today  #Supportive Care -- chemotherapy education complete.  -- port placement not required.  -- zofran  8mg  q8H PRN and compazine  10mg  PO q6H for nausea -- acyclovir  400mg  PO BID for VCZ prophylaxis -- tylenol  1000 mg q8H PRN for back pain.   No orders of the defined types were placed in this encounter.   Patient expressed understanding of the plan provided.   I have spent a total of 30 minutes minutes of face-to-face and non-face-to-face time, preparing to see the patient, performing a medically appropriate examination, counseling and educating the patient, ordering tests/procedures, documenting clinical information in the electronic health record, and care coordination.   Norleen IVAR Kidney, MD Department of Hematology/Oncology [facility] Phone: [phone] Pager: [phone] Email: [email or web address]

## 2024-06-17 DIAGNOSIS — R296 Repeated falls: Secondary | ICD-10-CM | POA: Diagnosis not present

## 2024-06-17 DIAGNOSIS — M62562 Muscle wasting and atrophy, not elsewhere classified, left lower leg: Secondary | ICD-10-CM | POA: Diagnosis not present

## 2024-06-17 DIAGNOSIS — M62561 Muscle wasting and atrophy, not elsewhere classified, right lower leg: Secondary | ICD-10-CM | POA: Diagnosis not present

## 2024-06-17 DIAGNOSIS — R2681 Unsteadiness on feet: Secondary | ICD-10-CM | POA: Diagnosis not present

## 2024-06-17 LAB — KAPPA/LAMBDA LIGHT CHAINS
Kappa free light chain: 39.4 mg/L — ABNORMAL HIGH (ref 3.3–19.4)
Kappa, lambda light chain ratio: 0.97 (ref 0.26–1.65)
Lambda free light chains: 40.7 mg/L — ABNORMAL HIGH (ref 5.7–26.3)

## 2024-06-18 ENCOUNTER — Ambulatory Visit: Payer: Self-pay | Admitting: *Deleted

## 2024-06-18 ENCOUNTER — Inpatient Hospital Stay: Admitting: Hematology and Oncology

## 2024-06-18 DIAGNOSIS — R2681 Unsteadiness on feet: Secondary | ICD-10-CM | POA: Diagnosis not present

## 2024-06-18 DIAGNOSIS — M62541 Muscle wasting and atrophy, not elsewhere classified, right hand: Secondary | ICD-10-CM | POA: Diagnosis not present

## 2024-06-18 DIAGNOSIS — M62542 Muscle wasting and atrophy, not elsewhere classified, left hand: Secondary | ICD-10-CM | POA: Diagnosis not present

## 2024-06-18 LAB — MULTIPLE MYELOMA PANEL, SERUM
Albumin SerPl Elph-Mcnc: 3 g/dL (ref 2.9–4.4)
Albumin/Glob SerPl: 1 (ref 0.7–1.7)
Alpha 1: 0.5 g/dL — ABNORMAL HIGH (ref 0.0–0.4)
Alpha2 Glob SerPl Elph-Mcnc: 1 g/dL (ref 0.4–1.0)
B-Globulin SerPl Elph-Mcnc: 1 g/dL (ref 0.7–1.3)
Gamma Glob SerPl Elph-Mcnc: 0.8 g/dL (ref 0.4–1.8)
Globulin, Total: 3.3 g/dL (ref 2.2–3.9)
IgA: 319 mg/dL (ref 64–422)
IgG (Immunoglobin G), Serum: 1126 mg/dL (ref 586–1602)
IgM (Immunoglobulin M), Srm: 59 mg/dL (ref 26–217)
M Protein SerPl Elph-Mcnc: 0.3 g/dL — ABNORMAL HIGH
Total Protein ELP: 6.3 g/dL (ref 6.0–8.5)

## 2024-06-18 NOTE — Telephone Encounter (Signed)
 TCT patiewnt regarding recent lab results. Spoke with her. Advised that there was a slight bump in her M protein to 0.3. No intervention required at this time, we'll keep close monitoring of the M protein levels. Her next visit is in Sept 23 @ 9am and we will repeat her labs then. Zoe [name] voiced understanding.

## 2024-06-18 NOTE — Telephone Encounter (Signed)
-----   Message from Norleen ONEIDA Kidney IV sent at 06/18/2024  1:25 PM EDT ----- Please let Zoe [name] know there was a slight bump in her M protein to 0.3. No intervention required at this time, we'll keep close monitoring of the M protein levels. Her next visit is in Sept at  which time we'll repeat the labs.  ----- Message ----- From: Rebecka, [facility] [place] Sent: 06/16/2024  11:00 AM EDT To: Norleen ONEIDA Kidney [place], MD

## 2024-06-19 DIAGNOSIS — M62562 Muscle wasting and atrophy, not elsewhere classified, left lower leg: Secondary | ICD-10-CM | POA: Diagnosis not present

## 2024-06-19 DIAGNOSIS — M62561 Muscle wasting and atrophy, not elsewhere classified, right lower leg: Secondary | ICD-10-CM | POA: Diagnosis not present

## 2024-06-19 DIAGNOSIS — R296 Repeated falls: Secondary | ICD-10-CM | POA: Diagnosis not present

## 2024-06-19 DIAGNOSIS — R2681 Unsteadiness on feet: Secondary | ICD-10-CM | POA: Diagnosis not present

## 2024-06-24 ENCOUNTER — Encounter (INDEPENDENT_AMBULATORY_CARE_PROVIDER_SITE_OTHER): Admitting: Ophthalmology

## 2024-06-24 DIAGNOSIS — H35033 Hypertensive retinopathy, bilateral: Secondary | ICD-10-CM

## 2024-06-24 DIAGNOSIS — H35372 Puckering of macula, left eye: Secondary | ICD-10-CM | POA: Diagnosis not present

## 2024-06-24 DIAGNOSIS — H43813 Vitreous degeneration, bilateral: Secondary | ICD-10-CM | POA: Diagnosis not present

## 2024-06-24 DIAGNOSIS — H353231 Exudative age-related macular degeneration, bilateral, with active choroidal neovascularization: Secondary | ICD-10-CM

## 2024-06-24 DIAGNOSIS — I10 Essential (primary) hypertension: Secondary | ICD-10-CM | POA: Diagnosis not present

## 2024-06-25 DIAGNOSIS — M62561 Muscle wasting and atrophy, not elsewhere classified, right lower leg: Secondary | ICD-10-CM | POA: Diagnosis not present

## 2024-06-25 DIAGNOSIS — M62541 Muscle wasting and atrophy, not elsewhere classified, right hand: Secondary | ICD-10-CM | POA: Diagnosis not present

## 2024-06-25 DIAGNOSIS — R296 Repeated falls: Secondary | ICD-10-CM | POA: Diagnosis not present

## 2024-06-25 DIAGNOSIS — M62542 Muscle wasting and atrophy, not elsewhere classified, left hand: Secondary | ICD-10-CM | POA: Diagnosis not present

## 2024-06-25 DIAGNOSIS — M62562 Muscle wasting and atrophy, not elsewhere classified, left lower leg: Secondary | ICD-10-CM | POA: Diagnosis not present

## 2024-06-26 DIAGNOSIS — M62561 Muscle wasting and atrophy, not elsewhere classified, right lower leg: Secondary | ICD-10-CM | POA: Diagnosis not present

## 2024-06-26 DIAGNOSIS — M62562 Muscle wasting and atrophy, not elsewhere classified, left lower leg: Secondary | ICD-10-CM | POA: Diagnosis not present

## 2024-06-26 DIAGNOSIS — R2681 Unsteadiness on feet: Secondary | ICD-10-CM | POA: Diagnosis not present

## 2024-06-26 DIAGNOSIS — R296 Repeated falls: Secondary | ICD-10-CM | POA: Diagnosis not present

## 2024-07-01 ENCOUNTER — Encounter: Payer: Self-pay | Admitting: Hematology and Oncology

## 2024-07-01 ENCOUNTER — Telehealth: Payer: Self-pay

## 2024-07-01 NOTE — Telephone Encounter (Signed)
 Dental clearance letter for Zometa and last office note faxed to  Donnice Dickens, DDS   Confirmation received

## 2024-07-01 NOTE — Telephone Encounter (Signed)
 Dr Pasco responded to Dental Clearance letter and said he is unsure about clearance.  Pt has not had a regular dental exam or xrays so it is difficult for him to give an opinion.  Pt advised and will call his office to schedule an appt.

## 2024-07-02 ENCOUNTER — Other Ambulatory Visit: Payer: Self-pay | Admitting: *Deleted

## 2024-07-02 DIAGNOSIS — R2681 Unsteadiness on feet: Secondary | ICD-10-CM | POA: Diagnosis not present

## 2024-07-02 DIAGNOSIS — M62542 Muscle wasting and atrophy, not elsewhere classified, left hand: Secondary | ICD-10-CM | POA: Diagnosis not present

## 2024-07-02 DIAGNOSIS — M62541 Muscle wasting and atrophy, not elsewhere classified, right hand: Secondary | ICD-10-CM | POA: Diagnosis not present

## 2024-07-02 DIAGNOSIS — C9000 Multiple myeloma not having achieved remission: Secondary | ICD-10-CM

## 2024-07-02 MED ORDER — LENALIDOMIDE 5 MG PO CAPS: 5.0000 mg | ORAL_CAPSULE | Freq: Every day | ORAL | 0 refills | Status: DC

## 2024-07-03 DIAGNOSIS — R296 Repeated falls: Secondary | ICD-10-CM | POA: Diagnosis not present

## 2024-07-03 DIAGNOSIS — R2681 Unsteadiness on feet: Secondary | ICD-10-CM | POA: Diagnosis not present

## 2024-07-03 DIAGNOSIS — M62562 Muscle wasting and atrophy, not elsewhere classified, left lower leg: Secondary | ICD-10-CM | POA: Diagnosis not present

## 2024-07-03 DIAGNOSIS — M62561 Muscle wasting and atrophy, not elsewhere classified, right lower leg: Secondary | ICD-10-CM | POA: Diagnosis not present

## 2024-07-07 ENCOUNTER — Other Ambulatory Visit: Payer: Self-pay | Admitting: Hematology and Oncology

## 2024-07-07 DIAGNOSIS — M62541 Muscle wasting and atrophy, not elsewhere classified, right hand: Secondary | ICD-10-CM | POA: Diagnosis not present

## 2024-07-07 DIAGNOSIS — C9000 Multiple myeloma not having achieved remission: Secondary | ICD-10-CM

## 2024-07-07 DIAGNOSIS — M62542 Muscle wasting and atrophy, not elsewhere classified, left hand: Secondary | ICD-10-CM | POA: Diagnosis not present

## 2024-07-07 DIAGNOSIS — R2681 Unsteadiness on feet: Secondary | ICD-10-CM | POA: Diagnosis not present

## 2024-07-14 ENCOUNTER — Telehealth: Payer: Self-pay | Admitting: Cardiology

## 2024-07-14 ENCOUNTER — Other Ambulatory Visit: Payer: Self-pay | Admitting: Physician Assistant

## 2024-07-14 ENCOUNTER — Other Ambulatory Visit (HOSPITAL_COMMUNITY): Payer: Self-pay | Admitting: Physician Assistant

## 2024-07-14 DIAGNOSIS — C9000 Multiple myeloma not having achieved remission: Secondary | ICD-10-CM

## 2024-07-14 NOTE — Progress Notes (Unsigned)
 [facility] [phone]   [phone]   PROGRESS NOTE   Patient Care Team: Associates, [facility] as PCP - General (Rheumatology)   Hematological/Oncological History # IgG Lambda Multiple Myeloma  06/14/2022: establish care with Zoe [name] due to anemia. Labs showed M protein 4.9, Kappa 7.2, Lambda 10.8, ratio 0.67 07/13/2022: Bmbx showed Lambda restricted plasma cell neoplasm involving approximately 60% of the cellular marrow by IHC on the biopsy.  08/01/2022: Cycle 1 Day 1 of VRd chemotherapy. (Holding revlimid  initially) 08/21/2022:  Cycle 2 Day 1 of VRd chemotherapy. (Holding revlimid ) 09/04/2022: Cycle 3 Day 1 of VRd chemotherapy. Started revlimid  10/03/2022: Cycle 4 Day 1 of VRd chemotherapy 10/31/2022: Cycle 5 Day 1 of VRd chemotherapy 11/20/2022: Cycle 6 Day 1 of VRd chemotherapy 12/11/2022: Cycle 7 Day 1 of VRd chemotherapy 01/02/2023: Cycle 8 Day 1 of VRd chemotherapy 01/22/2023: Cycle 9 Day 1 of VRd chemotherapy 02/23/2023: Day 1 Cycle 1 of Dara/Rev/Dex 03/23/2023: Day 1 Cycle 2 of Dara/Rev/Dex 05/06/2023: chemotherapy on pause due to chest pain/cardiac/respiratory issues.  05/18/2023: Cycle 3 Day 1 of Dara/Rev/Dex 06/15/2023: Cycle 4 Day 1 of Dara/Rev/Dex 07/13/2023: Cycle 5 Day 1 of Dara/Rev/Dex 08/10/2023: Cycle 6 Day 1 of Dara/Rev/Dex. Transition to maintenance revlimid .   INTERVAL HISTORY: Zoe [name] 89 y.o. female returns to the clinic today for a follow-up visit accompanied by a friend.   Her last visit was on 06/16/2024. In the interim, she has had no major changes in her health.   Zoe [name] reports her energy and appetite are fairly stable.  She has noticed increased weight gain approximately 6 pounds since the last visit.  She reports having worsening lower extremity edema starting mid calf to her ankles.  She resumed Lasix  20 mg 2 days ago with improvement of edema.  She denies nausea, vomiting or bowel habit changes.  She  denies easy bruising or signs of bleeding.  Patient denies fevers, chills, night sweats, shortness of breath, chest pain or cough.  She has no other complaints.  Rest of the 10 point ROS as below.  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  MEDICATIONS:  Current Outpatient Medications  Medication Sig Dispense Refill   acetaminophen  (TYLENOL ) 500 MG tablet Take 1,000 mg by mouth every 6 (six) hours as needed.     albuterol  (VENTOLIN  HFA) 108 (90 Base) MCG/ACT inhaler Inhale 2 puffs into the lungs every 6 (six) hours as needed for wheezing or shortness of breath.     amiodarone  (PACERONE ) 100 MG tablet Take 100 mg by mouth daily.     amLODipine  (NORVASC ) 5 MG tablet Take 5 mg by mouth daily.     apixaban  (ELIQUIS ) 5 MG TABS tablet Take 1 tablet (5  mg total) by mouth 2 (two) times daily. 180 tablet 1   Artificial Tear Solution (SOOTHE XP) SOLN Place 1 drop into both eyes every evening.     Cholecalciferol  (VITAMIN D3) 50 MCG (2000 UT) capsule Take 1 capsule (2,000 Units total) by mouth daily.     [facility] VITAMIN B12 1000 MCG tablet TAKE 1 TABLET BY MOUTH EVERY DAY 90 tablet 1   furosemide  (LASIX ) 20 MG tablet Three times a week as needed; take potassium if taking lasix      lenalidomide  (REVLIMID ) 5 MG capsule Take 1 capsule by mouth once daily for 21 days, then 7 days off.  21 capsule 0   levothyroxine  (SYNTHROID , LEVOTHROID) 100 MCG tablet Take 100 mcg by mouth daily before breakfast.  2   losartan  (COZAAR ) 50 MG tablet Take 1 tablet (50 mg total) by mouth daily.     metoprolol  tartrate (LOPRESSOR ) 50 MG tablet Take 50 mg by mouth 2 (two) times daily.     Multiple Vitamins-Minerals (PRESERVISION AREDS) CAPS Take 1 capsule by mouth 2 (two) times daily.     pantoprazole  (PROTONIX ) 40 MG tablet Take 1 tablet (40 mg total) by mouth daily.     potassium chloride  SA (KLOR-CON  M) 20 MEQ tablet Three times a week as needed only if taking lasix      rosuvastatin  (CRESTOR ) 20 MG tablet Take 1 tablet (20 mg total) by mouth daily. 90 tablet 3   No current facility-administered medications for this visit.    SURGICAL HISTORY:  Past Surgical History:  Procedure Laterality Date   ABDOMINAL HYSTERECTOMY  1970's   BACK SURGERY     BREAST LUMPECTOMY  12/1998   lumpectomy   CARDIAC CATHETERIZATION     CARDIOVERSION N/A 04/18/2023   Procedure: CARDIOVERSION;  Surgeon: Michele Richardson, DO;  Location: [facility];  Service: Cardiovascular;  Laterality: N/A;   CARDIOVERSION N/A 04/20/2023   Procedure: CARDIOVERSION;  Surgeon: Ladona Heinz, MD;  Location: [facility];  Service: Cardiovascular;  Laterality: N/A;   EYE SURGERY     cataract surgery bilat    INTRAOPERATIVE TRANSTHORACIC ECHOCARDIOGRAM N/A 09/03/2018   Procedure: INTRAOPERATIVE TRANSTHORACIC ECHOCARDIOGRAM;  Surgeon: Verlin Lonni BIRCH, MD;  Location: [facility] OR;  Service: Open Heart Surgery;  Laterality: N/A;   KYPHOPLASTY N/A 09/07/2022   Procedure: THORACIC EIGHT KYPHOPLASTY;  Surgeon: Burnetta Aures, MD;  Location: [facility] OR;  Service: Orthopedics;  Laterality: N/A;  1 hr Local with IV [facility] 3 C-Bed   LITHOTRIPSY     PACEMAKER IMPLANT N/A 01/09/2024   Procedure: PACEMAKER IMPLANT;  Surgeon: Waddell Danelle ORN, MD;  Location: [facility];  Service: Cardiovascular;  Laterality: N/A;   Right total  knee     2018 Dr. Ernie   RIGHT/LEFT HEART CATH AND CORONARY ANGIOGRAPHY N/A 08/06/2018   Procedure: RIGHT/LEFT HEART CATH AND CORONARY ANGIOGRAPHY;  Surgeon: Ladona Heinz, MD;  Location: [facility];  Service: Cardiovascular;  Laterality: N/A;   THYROIDECTOMY, PARTIAL  1975   TONSILLECTOMY     as a child - patient not sure of exact date   TOTAL KNEE ARTHROPLASTY Left 03/13/2016   Procedure: TOTAL KNEE ARTHROPLASTY;  Surgeon: Donnice Ernie, MD;  Location: [facility] ORS;  Service: Orthopedics;  Laterality: Left;   TOTAL KNEE ARTHROPLASTY Right 06/18/2017   Procedure: RIGHT TOTAL KNEE ARTHROPLASTY;  Surgeon: Ernie Donnice, MD;  Location: [facility] ORS;  Service: Orthopedics;  Laterality: Right;   TRANSCATHETER AORTIC VALVE REPLACEMENT, TRANSFEMORAL N/A 09/03/2018   Procedure: TRANSCATHETER  AORTIC VALVE REPLACEMENT, TRANSFEMORAL;  Surgeon: Verlin Lonni BIRCH, MD;  Location: [facility] OR;  Service: Open Heart Surgery;  Laterality: N/A;   REVIEW OF SYSTEMS:   Constitutional: ( - ) fevers, ( - )  chills , ( - ) night sweats Eyes: ( - ) blurriness of vision, ( - ) double vision, ( - ) watery eyes Ears, nose, mouth, throat, and face: ( - ) mucositis, ( - ) sore throat Respiratory: ( -) cough, ( - ) dyspnea, ( - ) wheezes Cardiovascular: ( - ) palpitation, ( - ) chest discomfort, ( +) lower extremity swelling Gastrointestinal:  ( - ) nausea, ( - ) heartburn, ( - ) change in bowel habits Skin: ( - ) abnormal skin rashes Lymphatics: ( - ) new lymphadenopathy, ( - ) easy bruising Neurological: ( - ) numbness, ( - ) tingling, ( - ) new weaknesses Behavioral/Psych: ( - ) mood change, ( - ) new changes  All other systems were reviewed with the patient and are negative.   PHYSICAL EXAMINATION:  Blood pressure 129/68, pulse 60, temperature (!) 97.3 F (36.3 C), temperature source Temporal, resp. rate 13, weight 177 lb 9.6 oz (80.6 kg), SpO2 98%.  ECOG PERFORMANCE STATUS: 1  GENERAL: well appearing elderly  Caucasian female, alert, no distress and comfortable SKIN: skin color, texture, turgor are normal, no rashes or significant lesions EYES: conjunctiva are pink and non-injected, sclera clear LUNGS:normal breathing effort.  HEART: irregular rhythm ,normal rate and no murmurs. Bilateral lower extremity edema starting mid calf.  Musculoskeletal: no cyanosis of digits and no clubbing  PSYCH: alert & oriented x 3, fluent speech NEURO: no focal motor/sensory deficits    LABORATORY DATA: Lab Results  Component Value Date   WBC 4.8 07/15/2024   HGB 11.5 (L) 07/15/2024   HCT 35.2 (L) 07/15/2024   MCV 87.3 07/15/2024   PLT 146 (L) 07/15/2024      Chemistry      Component Value Date/Time   NA 139 07/15/2024 0917   NA 130 (L) 06/19/2022 1012   K 3.8 07/15/2024 0917   CL 105 07/15/2024 0917   CO2 25 07/15/2024 0917   BUN 31 (H) 07/15/2024 0917   BUN 21 06/19/2022 1012   CREATININE 1.19 (H) 07/15/2024 0917      Component Value Date/Time   CALCIUM  9.0 07/15/2024 0917   ALKPHOS 87 07/15/2024 0917   AST 14 (L) 07/15/2024 0917   ALT 9 07/15/2024 0917   BILITOT 0.6 07/15/2024 0917      / RADIOGRAPHIC STUDIES:  No results found.    ASSESSMENT/PLAN:  Latoria  B Weathersby is a 89 y.o. female who presents to the clinic for evaluation for IgG lambda multiple myeloma.   # IgG Lambda Multiple Myeloma  --diagnosis of MM confirmed with bone marrow biopsy showing 60% plasma cells and anemia -- Bone survey shows no lytic lesions, kidney function is within normal limits.  --08/01/2022 was Cycle 1 Day 1 of VRd  - discontinued velcade  on 02/05/23 due to rash. She is status post day 15 cycle #9.  -changed the care plan to Daratumumab , Revlimid , and decadron . She is scheduled for day 1 cycle #1 today  -Started Cycle 1, Day 1 of Dara/Rev/Dex on 02/23/2023.  Plan:  --Labs from today show WBC 4.8, Hgb 11.5, Plt 146K, creatinine stable at 1.19, calcium  normal, LFTs in range. Myeloma labs pending  today.  --Most recent myeloma labs from 06/16/2024 showed M protein measuring 0.3 g/dL. Kappa light chain  overall stable at 39.4, lambda light chain increased to 40.7.  -- Continue Revlimid  5 mg daily 21 days of 28-day cycle.  --RTC in 4 weeks for labs and clinic visit and to reevaluate how she is doing on Revlimid .  #LE edema: --Resumed lasix  20 mg  daily for the last two days with potassium supplementation --Since edema is improving, advised to continue lasix  therapy and follow up with cardiology if symptoms don't improve.   #Hypotension/BP/Atrial Fibrillation  --Follows with cardiology, Dr. Ladona --Was recently admitted from 01/08/2024 through 01/14/2024 after having syncopal episode and found to have complete heart block.  She underwent pacemaker placement. --Patient's blood pressure is 129/68 today which is below her baseline. --Advised to check the blood pressure in the morning before taking her antihypertensives and follow-up with Dr. Ladona if her blood pressure is low or she is symptomatic. --Continue on Eliquis  5 mg BID   # Normocytic Anemia #Vitamin B12 Deficiency --Anemia likely driven by multiple myeloma, but may be component of vitamin B12 deficiency as well. --initial labs showed elevated MMA with B12 180 -- Continue vitamin B12 1000 mcg p.o. daily -- Continue to monitor   # H/O Left lower extremity neuropathy: --Currently on gabapentin  900 mg nightly and 600 mg in the morning --Encouraged to try water  aerobics and stretches.  --Following with Dr. Bobie (ortho) to see if she is a candidate for steroid injections.  -- Patient evaluated by Dr. Buckley thinks that this may be neuropathy worsened by her Velcade  therapy.   #Episode of right UE tremor and speech abnormality: --Evaluated by Dr. Buckley on 03/20/2023. Presumed etiology had been atrial fibrillation, but vascular disease, atheromatous changes, are also possibly implicated concurrently.  --MRI brain from 03/13/2023 show no  acute finding including no evidence of intracranial neoplastic disease. There are chronic small-vessel ischemic changes of the pons, cerebral hemispheric white matter and right cerebellum. --Educated patient to seek immediate evaluate if there are repeat episodes. --No neurological deficits identified per exam today  #Supportive Care -- chemotherapy education complete.  -- port placement not required.  -- zofran  8mg  q8H PRN and compazine  10mg  PO q6H for nausea -- acyclovir  400mg  PO BID for VCZ prophylaxis -- tylenol  1000 mg q8H PRN for back pain.   No orders of the defined types were placed in this encounter.   Patient expressed understanding of the plan provided.   I have spent a total of 30 minutes minutes of face-to-face and non-face-to-face time, preparing to see the patient, performing a medically appropriate examination, counseling and educating the patient, ordering tests/procedures, documenting clinical information in the electronic health record, and care coordination.   Zoe Police PA-C Dept of Hematology and Oncology [facility] at [facility] Phone: [phone]

## 2024-07-14 NOTE — Telephone Encounter (Signed)
 Spoke with pt who is concerned about the swelling she is having at her feet and ankles.  This doesn't seem to be worse than previous but not resolving overnight.  She reports she props her feet up with 2 pillows at night.   She has been taking Furosemide  20 mg once daily as needed up to 3 times a week - along with potassium chl.  She denies any SOB though reports sometimes she feels hot during the night and has to sit up on the side of the bed.  No reported increase in SOB with activity.  Does not weigh daily as she is in a facility and does not have access to scales.  Feels like she has gained some weight but admits this is likely due to eating desert at dinner most every night.  She does not wear her knee high compression stocking because they are difficult to put on.  Her daughter- in - law is to bring her new compression stockings tomorrow.  She will try to start using them daily.  She will continue to elevate feet/legs above the level of her heart as much as possible.  She will monitor and limit NA+ content.  Advised to take Furosemide  as instructed and make sure she is also taking potassium chloride  as well.  She reports she is doing this.  Per her report, furosemide  is increasing her urine output when she takes it.  Advised I will forward this information to Dr Ladona for his knowledge.  Pt also reports concern about her BP.  Before her BP medication in the AM BP is 140s/? And after 120-130s/?.  Reassurance given.

## 2024-07-14 NOTE — Telephone Encounter (Signed)
 Pt c/o medication issue:  1. Name of Medication:  furosemide  (LASIX ) 20 MG tablet   2. How are you currently taking this medication (dosage and times per day)?   3. Are you having a reaction (difficulty breathing--STAT)?   4. What is your medication issue?   Patient says she takes 1 tablet daily but would like to know if she can increase to 2 tablets daily. Please advise.

## 2024-07-15 ENCOUNTER — Inpatient Hospital Stay (HOSPITAL_BASED_OUTPATIENT_CLINIC_OR_DEPARTMENT_OTHER): Admitting: Physician Assistant

## 2024-07-15 ENCOUNTER — Inpatient Hospital Stay: Attending: Physician Assistant

## 2024-07-15 VITALS — BP 129/68 | HR 60 | Temp 97.3°F | Resp 13 | Wt 177.6 lb

## 2024-07-15 DIAGNOSIS — C9000 Multiple myeloma not having achieved remission: Secondary | ICD-10-CM | POA: Insufficient documentation

## 2024-07-15 DIAGNOSIS — I959 Hypotension, unspecified: Secondary | ICD-10-CM | POA: Diagnosis not present

## 2024-07-15 DIAGNOSIS — I4891 Unspecified atrial fibrillation: Secondary | ICD-10-CM | POA: Insufficient documentation

## 2024-07-15 DIAGNOSIS — E538 Deficiency of other specified B group vitamins: Secondary | ICD-10-CM | POA: Diagnosis not present

## 2024-07-15 DIAGNOSIS — Z79899 Other long term (current) drug therapy: Secondary | ICD-10-CM | POA: Diagnosis not present

## 2024-07-15 DIAGNOSIS — Z7961 Long term (current) use of immunomodulator: Secondary | ICD-10-CM | POA: Diagnosis not present

## 2024-07-15 DIAGNOSIS — G629 Polyneuropathy, unspecified: Secondary | ICD-10-CM | POA: Insufficient documentation

## 2024-07-15 DIAGNOSIS — R600 Localized edema: Secondary | ICD-10-CM

## 2024-07-15 LAB — CMP (CANCER CENTER ONLY)
ALT: 9 U/L (ref 0–44)
AST: 14 U/L — ABNORMAL LOW (ref 15–41)
Albumin: 3.8 g/dL (ref 3.5–5.0)
Alkaline Phosphatase: 87 U/L (ref 38–126)
Anion gap: 9 (ref 5–15)
BUN: 31 mg/dL — ABNORMAL HIGH (ref 8–23)
CO2: 25 mmol/L (ref 22–32)
Calcium: 9 mg/dL (ref 8.9–10.3)
Chloride: 105 mmol/L (ref 98–111)
Creatinine: 1.19 mg/dL — ABNORMAL HIGH (ref 0.44–1.00)
GFR, Estimated: 44 mL/min — ABNORMAL LOW (ref >60)
Glucose, Bld: 116 mg/dL — ABNORMAL HIGH (ref 70–99)
Potassium: 3.8 mmol/L (ref 3.5–5.1)
Sodium: 139 mmol/L (ref 135–145)
Total Bilirubin: 0.6 mg/dL (ref 0.0–1.2)
Total Protein: 6.8 g/dL (ref 6.5–8.1)

## 2024-07-15 LAB — CBC WITH DIFFERENTIAL (CANCER CENTER ONLY)
Abs Immature Granulocytes: 0.01 K/uL (ref 0.00–0.07)
Basophils Absolute: 0.1 K/uL (ref 0.0–0.1)
Basophils Relative: 2 %
Eosinophils Absolute: 0.1 K/uL (ref 0.0–0.5)
Eosinophils Relative: 3 %
HCT: 35.2 % — ABNORMAL LOW (ref 36.0–46.0)
Hemoglobin: 11.5 g/dL — ABNORMAL LOW (ref 12.0–15.0)
Immature Granulocytes: 0 %
Lymphocytes Relative: 18 %
Lymphs Abs: 0.9 K/uL (ref 0.7–4.0)
MCH: 28.5 pg (ref 26.0–34.0)
MCHC: 32.7 g/dL (ref 30.0–36.0)
MCV: 87.3 fL (ref 80.0–100.0)
Monocytes Absolute: 1 K/uL (ref 0.1–1.0)
Monocytes Relative: 22 %
Neutro Abs: 2.7 K/uL (ref 1.7–7.7)
Neutrophils Relative %: 55 %
Platelet Count: 146 K/uL — ABNORMAL LOW (ref 150–400)
RBC: 4.03 MIL/uL (ref 3.87–5.11)
RDW: 17.4 % — ABNORMAL HIGH (ref 11.5–15.5)
WBC Count: 4.8 K/uL (ref 4.0–10.5)
nRBC: 0 % (ref 0.0–0.2)

## 2024-07-15 LAB — LACTATE DEHYDROGENASE: LDH: 224 U/L — ABNORMAL HIGH (ref 98–192)

## 2024-07-16 LAB — KAPPA/LAMBDA LIGHT CHAINS
Kappa free light chain: 40.4 mg/L — ABNORMAL HIGH (ref 3.3–19.4)
Kappa, lambda light chain ratio: 1.13 (ref 0.26–1.65)
Lambda free light chains: 35.9 mg/L — ABNORMAL HIGH (ref 5.7–26.3)

## 2024-07-18 DIAGNOSIS — M62542 Muscle wasting and atrophy, not elsewhere classified, left hand: Secondary | ICD-10-CM | POA: Diagnosis not present

## 2024-07-18 DIAGNOSIS — R2681 Unsteadiness on feet: Secondary | ICD-10-CM | POA: Diagnosis not present

## 2024-07-18 DIAGNOSIS — M62541 Muscle wasting and atrophy, not elsewhere classified, right hand: Secondary | ICD-10-CM | POA: Diagnosis not present

## 2024-07-18 LAB — MULTIPLE MYELOMA PANEL, SERUM
Albumin SerPl Elph-Mcnc: 3.4 g/dL (ref 2.9–4.4)
Albumin/Glob SerPl: 1.2 (ref 0.7–1.7)
Alpha 1: 0.3 g/dL (ref 0.0–0.4)
Alpha2 Glob SerPl Elph-Mcnc: 0.8 g/dL (ref 0.4–1.0)
B-Globulin SerPl Elph-Mcnc: 1 g/dL (ref 0.7–1.3)
Gamma Glob SerPl Elph-Mcnc: 0.9 g/dL (ref 0.4–1.8)
Globulin, Total: 3 g/dL (ref 2.2–3.9)
IgA: 274 mg/dL (ref 64–422)
IgG (Immunoglobin G), Serum: 1075 mg/dL (ref 586–1602)
IgM (Immunoglobulin M), Srm: 54 mg/dL (ref 26–217)
M Protein SerPl Elph-Mcnc: 0.2 g/dL — ABNORMAL HIGH
Total Protein ELP: 6.4 g/dL (ref 6.0–8.5)

## 2024-07-21 NOTE — Progress Notes (Signed)
 Remote PPM Transmission

## 2024-07-24 ENCOUNTER — Emergency Department (HOSPITAL_COMMUNITY)
Admission: EM | Admit: 2024-07-24 | Discharge: 2024-07-24 | Disposition: A | Discharge status: Home / Self Care | Attending: Emergency Medicine | Admitting: Emergency Medicine

## 2024-07-24 ENCOUNTER — Encounter (HOSPITAL_COMMUNITY): Payer: Self-pay

## 2024-07-24 ENCOUNTER — Ambulatory Visit: Payer: Self-pay | Admitting: Physician Assistant

## 2024-07-24 ENCOUNTER — Other Ambulatory Visit: Payer: Self-pay

## 2024-07-24 ENCOUNTER — Emergency Department (HOSPITAL_COMMUNITY)

## 2024-07-24 DIAGNOSIS — Z8579 Personal history of other malignant neoplasms of lymphoid, hematopoietic and related tissues: Secondary | ICD-10-CM | POA: Insufficient documentation

## 2024-07-24 DIAGNOSIS — R918 Other nonspecific abnormal finding of lung field: Secondary | ICD-10-CM | POA: Diagnosis not present

## 2024-07-24 DIAGNOSIS — Z7901 Long term (current) use of anticoagulants: Secondary | ICD-10-CM | POA: Diagnosis not present

## 2024-07-24 DIAGNOSIS — N189 Chronic kidney disease, unspecified: Secondary | ICD-10-CM | POA: Diagnosis not present

## 2024-07-24 DIAGNOSIS — Z978 Presence of other specified devices: Secondary | ICD-10-CM | POA: Diagnosis not present

## 2024-07-24 DIAGNOSIS — R404 Transient alteration of awareness: Secondary | ICD-10-CM | POA: Diagnosis not present

## 2024-07-24 DIAGNOSIS — R42 Dizziness and giddiness: Secondary | ICD-10-CM | POA: Diagnosis not present

## 2024-07-24 DIAGNOSIS — R600 Localized edema: Secondary | ICD-10-CM | POA: Diagnosis not present

## 2024-07-24 DIAGNOSIS — I517 Cardiomegaly: Secondary | ICD-10-CM | POA: Diagnosis not present

## 2024-07-24 DIAGNOSIS — I959 Hypotension, unspecified: Secondary | ICD-10-CM | POA: Insufficient documentation

## 2024-07-24 DIAGNOSIS — R079 Chest pain, unspecified: Secondary | ICD-10-CM | POA: Diagnosis not present

## 2024-07-24 DIAGNOSIS — I4891 Unspecified atrial fibrillation: Secondary | ICD-10-CM | POA: Insufficient documentation

## 2024-07-24 DIAGNOSIS — I509 Heart failure, unspecified: Secondary | ICD-10-CM | POA: Diagnosis not present

## 2024-07-24 LAB — MAGNESIUM: Magnesium: 1.9 mg/dL (ref 1.7–2.4)

## 2024-07-24 LAB — CBC WITH DIFFERENTIAL/PLATELET
Abs Immature Granulocytes: 0.03 K/uL (ref 0.00–0.07)
Basophils Absolute: 0.1 K/uL (ref 0.0–0.1)
Basophils Relative: 2 %
Eosinophils Absolute: 0.2 K/uL (ref 0.0–0.5)
Eosinophils Relative: 3 %
HCT: 34.2 % — ABNORMAL LOW (ref 36.0–46.0)
Hemoglobin: 11 g/dL — ABNORMAL LOW (ref 12.0–15.0)
Immature Granulocytes: 1 %
Lymphocytes Relative: 18 %
Lymphs Abs: 1 K/uL (ref 0.7–4.0)
MCH: 28.8 pg (ref 26.0–34.0)
MCHC: 32.2 g/dL (ref 30.0–36.0)
MCV: 89.5 fL (ref 80.0–100.0)
Monocytes Absolute: 0.9 K/uL (ref 0.1–1.0)
Monocytes Relative: 16 %
Neutro Abs: 3.5 K/uL (ref 1.7–7.7)
Neutrophils Relative %: 60 %
Platelets: 112 K/uL — ABNORMAL LOW (ref 150–400)
RBC: 3.82 MIL/uL — ABNORMAL LOW (ref 3.87–5.11)
RDW: 17.1 % — ABNORMAL HIGH (ref 11.5–15.5)
WBC: 5.8 K/uL (ref 4.0–10.5)
nRBC: 0 % (ref 0.0–0.2)

## 2024-07-24 LAB — COMPREHENSIVE METABOLIC PANEL WITH GFR
ALT: 9 U/L (ref 0–44)
AST: 15 U/L (ref 15–41)
Albumin: 3.1 g/dL — ABNORMAL LOW (ref 3.5–5.0)
Alkaline Phosphatase: 73 U/L (ref 38–126)
Anion gap: 11 (ref 5–15)
BUN: 31 mg/dL — ABNORMAL HIGH (ref 8–23)
CO2: 21 mmol/L — ABNORMAL LOW (ref 22–32)
Calcium: 8.5 mg/dL — ABNORMAL LOW (ref 8.9–10.3)
Chloride: 104 mmol/L (ref 98–111)
Creatinine, Ser: 1.48 mg/dL — ABNORMAL HIGH (ref 0.44–1.00)
GFR, Estimated: 34 mL/min — ABNORMAL LOW (ref >60)
Glucose, Bld: 110 mg/dL — ABNORMAL HIGH (ref 70–99)
Potassium: 3.9 mmol/L (ref 3.5–5.1)
Sodium: 136 mmol/L (ref 135–145)
Total Bilirubin: 0.6 mg/dL (ref 0.0–1.2)
Total Protein: 6 g/dL — ABNORMAL LOW (ref 6.5–8.1)

## 2024-07-24 LAB — TROPONIN I (HIGH SENSITIVITY)
Troponin I (High Sensitivity): 17 ng/L (ref <18)
Troponin I (High Sensitivity): 16 ng/L (ref <18)

## 2024-07-24 MED ORDER — SODIUM CHLORIDE 0.9 % IV BOLUS
1000.0000 mL | Freq: Once | INTRAVENOUS | Status: AC
  Administered 2024-07-24: 1000 mL via INTRAVENOUS

## 2024-07-24 NOTE — ED Provider Notes (Signed)
 [place] EMERGENCY DEPARTMENT AT [facility] Provider Note   CSN: [id number] Arrival date & time: 07/24/24  1429     Patient presents with: Chest Pain and Dizziness   Zoe [name] is a 89 y.o. female.  With a history of multiple myeloma, status post TAVR, atrial fibrillation on Eliquis , heart failure MI CKD who presents to the ED for dizziness.  Patient is a resident of [facility] independent living facility and completed physical therapy today.  Shortly thereafter she began to develop wooziness and generalized weakness.  Her blood pressure was measured was noted to be low at that time mn with systolic blood pressure in the 90s.  EMS reports improvement in blood pressure directly prior to arrival.  Patient asymptomatic now feels much better.  Denies chest pain today shortness of breath nausea vomiting fever chills recent illness.    Chest Pain Associated symptoms: dizziness   Dizziness Associated symptoms: chest pain        Prior to Admission medications   Medication Sig Start Date End Date Taking? Authorizing Provider  acetaminophen  (TYLENOL ) 500 MG tablet Take 1,000 mg by mouth every 6 (six) hours as needed.    [provider]  albuterol  (VENTOLIN  HFA) 108 (90 Base) MCG/ACT inhaler Inhale 2 puffs into the lungs every 6 (six) hours as needed for wheezing or shortness of breath.    [provider]  amiodarone  (PACERONE ) 100 MG tablet Take 100 mg by mouth daily.    [provider]  amLODipine  (NORVASC ) 5 MG tablet Take 5 mg by mouth daily.    [provider]  apixaban  (ELIQUIS ) 5 MG TABS tablet Take 1 tablet (5 mg total) by mouth 2 (two) times daily. 11/15/23   Ladona Heinz, MD  Artificial Tear Solution (SOOTHE XP) SOLN Place 1 drop into both eyes every evening.    [provider]  Cholecalciferol  (VITAMIN D3) 50 MCG (2000 UT) capsule Take 1 capsule (2,000 Units total) by mouth daily. 09/11/22   Federico Norleen ONEIDA MADISON, MD  [facility]  VITAMIN B12 1000 MCG tablet TAKE 1 TABLET BY MOUTH EVERY DAY 02/15/23   Thayil, Irene T, PA-C  furosemide  (LASIX ) 20 MG tablet Three times a week as needed; take potassium if taking lasix  04/01/24   Patsy Lenis, MD  lenalidomide  (REVLIMID ) 5 MG capsule Take 1 capsule by mouth once daily for 21 days, then 7 days off. 07/07/24   Federico Norleen ONEIDA MADISON, MD  levothyroxine  (SYNTHROID , LEVOTHROID) 100 MCG tablet Take 100 mcg by mouth daily before breakfast. 12/29/15   [provider]  losartan  (COZAAR ) 50 MG tablet Take 1 tablet (50 mg total) by mouth daily. 01/14/24   Christobal Guadalajara, MD  metoprolol  tartrate (LOPRESSOR ) 50 MG tablet Take 50 mg by mouth 2 (two) times daily. 03/16/24   [provider]  Multiple Vitamins-Minerals (PRESERVISION AREDS) CAPS Take 1 capsule by mouth 2 (two) times daily.    [provider]  pantoprazole  (PROTONIX ) 40 MG tablet Take 1 tablet (40 mg total) by mouth daily. 04/01/24   Patsy Lenis, MD  potassium chloride  SA (KLOR-CON  M) 20 MEQ tablet Three times a week as needed only if taking lasix  04/01/24   Patsy Lenis, MD  rosuvastatin  (CRESTOR ) 20 MG tablet Take 1 tablet (20 mg total) by mouth daily. 04/02/24   Patsy Lenis, MD    Allergies: Quinolones, Penicillins, and Sulfa antibiotics    Review of Systems  Cardiovascular:  Positive for chest pain.  Neurological:  Positive  for dizziness.    Updated Vital Signs BP 136/61   Pulse 65   Temp 97.7 F (36.5 C) (Oral)   Resp 19   Ht 5' 5 (1.651 m)   Wt 80.3 kg   SpO2 92%   BMI 29.45 kg/m   Physical Exam Vitals and nursing note reviewed.  HENT:     Head: Normocephalic and atraumatic.  Eyes:     Pupils: Pupils are equal, round, and reactive to light.  Cardiovascular:     Rate and Rhythm: Normal rate and regular rhythm.  Pulmonary:     Effort: Pulmonary effort is normal.     Breath sounds: Normal breath sounds.  Abdominal:     Palpations: Abdomen is soft.     Tenderness: There is no  abdominal tenderness.  Musculoskeletal:     Right lower leg: Edema present.     Left lower leg: Edema present.     Comments: Trace bilateral pedal edema  Skin:    General: Skin is warm and dry.  Neurological:     Mental Status: She is alert.  Psychiatric:        Mood and Affect: Mood normal.     (all labs ordered are listed, but only abnormal results are displayed) Labs Reviewed  COMPREHENSIVE METABOLIC PANEL WITH GFR - Abnormal; Notable for the following components:      Result Value   CO2 21 (*)    Glucose, Bld 110 (*)    BUN 31 (*)    Creatinine, Ser 1.48 (*)    Calcium  8.5 (*)    Total Protein 6.0 (*)    Albumin 3.1 (*)    GFR, Estimated 34 (*)    All other components within normal limits  CBC WITH DIFFERENTIAL/PLATELET - Abnormal; Notable for the following components:   RBC 3.82 (*)    Hemoglobin 11.0 (*)    HCT 34.2 (*)    RDW 17.1 (*)    Platelets 112 (*)    All other components within normal limits  RESP PANEL BY RT-PCR (RSV, FLU A&B, COVID)  RVPGX2  MAGNESIUM   BRAIN NATRIURETIC PEPTIDE  TROPONIN I (HIGH SENSITIVITY)  TROPONIN I (HIGH SENSITIVITY)    EKG: EKG Interpretation Date/Time:  Thursday July 24 2024 14:43:49 EDT Ventricular Rate:  60 PR Interval:  55 QRS Duration:  123 QT Interval:  485 QTC Calculation: 485 R Axis:   -52  Text Interpretation: Sinus rhythm Short PR interval Probable left atrial enlargement Left bundle branch block Confirmed by Pamella Sharper [phone]) on 07/24/2024 8:01:24 PM  Radiology: DG Chest Portable 1 View Result Date: 07/24/2024 CLINICAL DATA:  Chest pain EXAM: PORTABLE CHEST 1 VIEW COMPARISON:  01/10/2024, chest CT 01/11/2024, chest x-ray 01/08/2024, 04/15/2023 FINDINGS: Left-sided pacing device and valve prosthesis as before. Cardiomegaly with aortic atherosclerosis. Minimal atelectasis or scarring at the bases. No focal opacity, pleural effusion, or pneumothorax. Vague left apical opacity. IMPRESSION: 1. Cardiomegaly  with minimal atelectasis or scarring at the bases. 2. Vague left apical opacity, possibly related to scarring. Chest CT follow-up could be obtained to confirm and exclude nodule Electronically Signed   By: Luke Bun M.D.   On: 07/24/2024 15:52     Procedures   Medications Ordered in the ED  sodium chloride  0.9 % bolus 1,000 mL (0 mLs Intravenous Stopped 07/24/24 1729)    Clinical Course as of 07/24/24 2038  Thu Jul 24, 2024  2036 No leukocytosis severe anemia or significant electrolyte balance.  Slight elevation in creatinine but  not AKI.  She has received 1 L IV fluids.  Delta troponin flat.  EKG unremarkable.  Chest x-ray shows no evidence of focal consolidation or severe edema.  Patient reports resolution in her symptoms.  Blood pressures remained stable.  She is requesting discharge home which is reasonable given her workup and stable blood pressure.  She will follow-up with her PCP. [MP]    Clinical Course User Index [MP] Pamella Ozell LABOR, DO                                 Medical Decision Making 89 year old female with history as above presented to the ED for transient episode of lightheadedness, weakness hypotension.  No chest pain shortness of breath.  Feels well now.  Afebrile normotensive.  No adventitious lung sounds on exam.  Differential diagnose includes ACS, dysrhythmia, heart failure exacerbation, pneumonia, viral respiratory illness, anemia electro imbalance.  Will obtain laboratory workup and continue to monitor hemodynamic status closely.  No associated falls or trauma today.  Amount and/or Complexity of Data Reviewed Labs: ordered. Radiology: ordered.        Final diagnoses:  Dizziness  Hypotension, unspecified hypotension type    ED Discharge Orders     None          Pamella Ozell LABOR, DO 07/24/24 2038

## 2024-07-24 NOTE — Discharge Instructions (Signed)
 You were seen in the emerged apartment for low blood pressure and feeling unwell Your blood pressure remained stable for 6 hours while you were here Your blood work EKG chest x-ray looked okay You need to follow-up with your primary doctor within 1 week for reevaluation Return to the Emergency Department for repeatedly low blood pressure, trouble breathing, chest pain or any other concerns

## 2024-07-24 NOTE — ED Triage Notes (Signed)
 Pt BIB [facility] from [facility] for intermittent CP x several months and new lightheadedness and weakness. Per EMS pt BP 98/48, typically pt Bps 130s-140s sys. Pt denies bowel or bladder changes, sources of bleeding, SHOB. All other VSS per EMS.

## 2024-08-06 ENCOUNTER — Other Ambulatory Visit: Payer: Self-pay | Admitting: *Deleted

## 2024-08-06 DIAGNOSIS — C9000 Multiple myeloma not having achieved remission: Secondary | ICD-10-CM

## 2024-08-06 MED ORDER — LENALIDOMIDE 5 MG PO CAPS: 5.0000 mg | ORAL_CAPSULE | Freq: Every day | ORAL | 0 refills | Status: DC

## 2024-08-10 NOTE — Progress Notes (Unsigned)
 [facility] [phone]   [phone]   PROGRESS NOTE   Patient Care Team: [facility], [facility] as PCP - General (Rheumatology)   Hematological/Oncological History # IgG Lambda Multiple Myeloma  06/14/2022: establish care with [facility] due to anemia. Labs showed M protein 4.9, Kappa 7.2, Lambda 10.8, ratio 0.67 07/13/2022: Bmbx showed Lambda restricted plasma cell neoplasm involving approximately 60% of the cellular marrow by IHC on the biopsy.  08/01/2022: Cycle 1 Day 1 of VRd chemotherapy. (Holding revlimid  initially) 08/21/2022:  Cycle 2 Day 1 of VRd chemotherapy. (Holding revlimid ) 09/04/2022: Cycle 3 Day 1 of VRd chemotherapy. Started revlimid  10/03/2022: Cycle 4 Day 1 of VRd chemotherapy 10/31/2022: Cycle 5 Day 1 of VRd chemotherapy 11/20/2022: Cycle 6 Day 1 of VRd chemotherapy 12/11/2022: Cycle 7 Day 1 of VRd chemotherapy 01/02/2023: Cycle 8 Day 1 of VRd chemotherapy 01/22/2023: Cycle 9 Day 1 of VRd chemotherapy 02/23/2023: Day 1 Cycle 1 of Dara/Rev/Dex 03/23/2023: Day 1 Cycle 2 of Dara/Rev/Dex 05/06/2023: chemotherapy on pause due to chest pain/cardiac/respiratory issues.  05/18/2023: Cycle 3 Day 1 of Dara/Rev/Dex 06/15/2023: Cycle 4 Day 1 of Dara/Rev/Dex 07/13/2023: Cycle 5 Day 1 of Dara/Rev/Dex 08/10/2023: Cycle 6 Day 1 of Dara/Rev/Dex. Transition to maintenance revlimid .   INTERVAL HISTORY: Zoe [name] 89 y.o. female returns to the clinic today for a follow-up visit accompanied by a friend.   Her last visit was on 07/15/2024. In the interim, she has had no major changes in her health.   Zoe [name] reports she has been well overall in the interim since her last visit.  She has her daughter-in-law from [facility]  visiting.  She reports that she is cold natured and does not enjoy this colder weather.  She reports that she underwent her dental work including x-rays, exams and her findings are favorable for Zometa.  We are  still waiting for clearance.  She reports that she does have a little bit of pain in her shoulder and continues to sleep on her side.  She reports that she does have some balance issues but has not had any recent falls.  She does have some occasional lightheadedness, dizziness, shortness of breath in the morning prior to taking her blood pressure medication.  She is tolerating her Revlimid  5 mg p.o. daily well with no side effects.  Overall she feels well and has no questions concerns or complaints today.  She denies any fevers, chills, sweats, nausea, vomiting or diarrhea.  A full 10 point ROS is otherwise negative.  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  MEDICATIONS:  Current Outpatient Medications  Medication Sig Dispense Refill   acetaminophen  (TYLENOL ) 500 MG tablet Take 1,000 mg by mouth every 6 (six) hours as  needed.     albuterol  (VENTOLIN  HFA) 108 (90 Base) MCG/ACT inhaler Inhale 2 puffs into the lungs every 6 (six) hours as needed for wheezing or shortness of breath.     amiodarone  (PACERONE ) 100 MG tablet Take 100 mg by mouth daily.     amLODipine  (NORVASC ) 5 MG tablet Take 5 mg by mouth daily.     apixaban  (ELIQUIS ) 5 MG TABS tablet Take 1 tablet (5 mg total) by mouth 2 (two) times daily. 180 tablet 1   Artificial Tear Solution (SOOTHE XP) SOLN Place 1  drop into both eyes every evening.     Cholecalciferol  (VITAMIN D3) 50 MCG (2000 UT) capsule Take 1 capsule (2,000 Units total) by mouth daily.     [employer] VITAMIN B12 1000 MCG tablet TAKE 1 TABLET BY MOUTH EVERY DAY 90 tablet 1   furosemide  (LASIX ) 20 MG tablet Three times a week as needed; take potassium if taking lasix      lenalidomide  (REVLIMID ) 5 MG capsule Take 1 capsule (5 mg total) by mouth daily. Bertrum Barrows #   [id number]   Date Obtained 08/06/24  Take daily for 21 days and then none for 7 days. 21 capsule 0   levothyroxine  (SYNTHROID , LEVOTHROID) 100 MCG tablet Take 100 mcg by mouth daily before breakfast.  2   losartan  (COZAAR ) 50 MG tablet Take 1 tablet (50 mg total) by mouth daily.     metoprolol  tartrate (LOPRESSOR ) 50 MG tablet Take 50 mg by mouth 2 (two) times daily.     Multiple Vitamins-Minerals (PRESERVISION AREDS) CAPS Take 1 capsule by mouth 2 (two) times daily.     pantoprazole  (PROTONIX ) 40 MG tablet Take 1 tablet (40 mg total) by mouth daily.     potassium chloride  SA (KLOR-CON  M) 20 MEQ tablet Three times a week as needed only if taking lasix      rosuvastatin  (CRESTOR ) 20 MG tablet Take 1 tablet (20 mg total) by mouth daily. 90 tablet 3   No current facility-administered medications for this visit.    SURGICAL HISTORY:  Past Surgical History:  Procedure Laterality Date   ABDOMINAL HYSTERECTOMY  1970's   BACK SURGERY     BREAST LUMPECTOMY  12/1998   lumpectomy   CARDIAC CATHETERIZATION     CARDIOVERSION N/A 04/18/2023   Procedure: CARDIOVERSION;  Surgeon: Michele Richardson, DO;  Location: [facility];  Service: Cardiovascular;  Laterality: N/A;   CARDIOVERSION N/A 04/20/2023   Procedure: CARDIOVERSION;  Surgeon: Ladona Heinz, MD;  Location: [facility];  Service: Cardiovascular;  Laterality: N/A;   EYE SURGERY     cataract surgery bilat    INTRAOPERATIVE TRANSTHORACIC ECHOCARDIOGRAM N/A 09/03/2018   Procedure: INTRAOPERATIVE TRANSTHORACIC ECHOCARDIOGRAM;   Surgeon: Verlin Lonni BIRCH, MD;  Location[facility] OR;  Service: Open Heart Surgery;  Laterality: N/A;   KYPHOPLASTY N/A 09/07/2022   Procedure: THORACIC EIGHT KYPHOPLASTY;  Surgeon: Burnetta Aures, MD;  Location: [facility] OR;  Service: Orthopedics;  Laterality: N/A;  1 hr Local with [facility] 3 C-Bed   LITHOTRIPSY     PACEMAKER IMPLANT N/A 01/09/2024   Procedure: PACEMAKER IMPLANT;  Surgeon: Waddell Danelle ORN, MD;  Location: [facility];  Service: Cardiovascular;  Laterality: N/A;   Right total knee     2018 Dr. Ernie   RIGHT/LEFT HEART CATH AND CORONARY ANGIOGRAPHY N/A 08/06/2018   Procedure: RIGHT/LEFT HEART CATH AND CORONARY ANGIOGRAPHY;  Surgeon: Ladona Heinz, MD;  Location: [facility];  Service: Cardiovascular;  Laterality: N/A;  THYROIDECTOMY, PARTIAL  1975   TONSILLECTOMY     as a child - patient not sure of exact date   TOTAL KNEE ARTHROPLASTY Left 03/13/2016   Procedure: TOTAL KNEE ARTHROPLASTY;  Surgeon: Donnice Car, MD;  Location: [facility] ORS;  Service: Orthopedics;  Laterality: Left;   TOTAL KNEE ARTHROPLASTY Right 06/18/2017   Procedure: RIGHT TOTAL KNEE ARTHROPLASTY;  Surgeon: Car Donnice, MD;  Location: [facility] OR[facility];  Service: Orthopedics;  Laterality: Right;   TRANSCATHETER AORTIC VALVE REPLACEMENT, TRANSFEMORAL N/A 09/03/2018   Procedure: TRANSCATHETER AORTIC VALVE REPLACEMENT, TRANSFEMORAL;  Surgeon: Verlin Lonni BIRCH, MD;  Location: [facility] OR;  Service: Open Heart Surgery;  Laterality: N/A;   REVIEW OF SYSTEMS:   Constitutional: ( - ) fevers, ( - )  chills , ( - ) night sweats Eyes: ( - ) blurriness of vision, ( - ) double vision, ( - ) watery eyes Ears, nose, mouth, throat, and face: ( - ) mucositis, ( - ) sore throat Respiratory: ( -) cough, ( - ) dyspnea, ( - ) wheezes Cardiovascular: ( - ) palpitation, ( - ) chest discomfort, ( +) lower extremity swelling Gastrointestinal:  ( - ) nausea, ( - ) heartburn, ( - ) change in bowel habits Skin: ( - ) abnormal skin  rashes Lymphatics: ( - ) new lymphadenopathy, ( - ) easy bruising Neurological: ( - ) numbness, ( - ) tingling, ( - ) new weaknesses Behavioral/Psych: ( - ) mood change, ( - ) new changes  All other systems were reviewed with the patient and are negative.   PHYSICAL EXAMINATION:  Blood pressure (!) 140/80, pulse 60, temperature (!) 97.4 F (36.3 C), temperature source Temporal, resp. rate 13, weight 168 lb 8 oz (76.4 kg), SpO2 100%.  ECOG PERFORMANCE STATUS: 1  GENERAL: well appearing elderly Caucasian female, alert, no distress and comfortable SKIN: skin color, texture, turgor are normal, no rashes or significant lesions EYES: conjunctiva are pink and non-injected, sclera clear LUNGS:normal breathing effort.  HEART: irregular rhythm ,normal rate and no murmurs. Bilateral lower extremity edema starting mid calf.  Musculoskeletal: no cyanosis of digits and no clubbing  PSYCH: alert & oriented x 3, fluent speech NEURO: no focal motor/sensory deficits    LABORATORY DATA: Lab Results  Component Value Date   WBC 4.9 08/11/2024   HGB 10.5 (L) 08/11/2024   HCT 31.8 (L) 08/11/2024   MCV 86.9 08/11/2024   PLT 140 (L) 08/11/2024      Chemistry      Component Value Date/Time   NA 138 08/11/2024 1006   NA 130 (L) 06/19/2022 1012   K 3.8 08/11/2024 1006   CL 106 08/11/2024 1006   CO2 25 08/11/2024 1006   BUN 27 (H) 08/11/2024 1006   BUN 21 06/19/2022 1012   CREATININE 1.13 (H) 08/11/2024 1006      Component Value Date/Time   CALCIUM  9.1 08/11/2024 1006   ALKPHOS 77 08/11/2024 1006   AST 12 (L) 08/11/2024 1006   ALT 6 08/11/2024 1006   BILITOT 0.5 08/11/2024 1006      / RADIOGRAPHIC STUDIES:  DG Chest Portable 1 View Result Date: 07/24/2024 CLINICAL DATA:  Chest pain EXAM: PORTABLE CHEST 1 VIEW COMPARISON:  01/10/2024, chest CT 01/11/2024, chest x-ray 01/08/2024, 04/15/2023 FINDINGS: Left-sided pacing device and valve prosthesis as before. Cardiomegaly with aortic  atherosclerosis. Minimal atelectasis or scarring at the bases. No focal opacity, pleural effusion, or pneumothorax. Vague left apical opacity. IMPRESSION: 1. Cardiomegaly with minimal atelectasis or scarring at  the bases. 2. Vague left apical opacity, possibly related to scarring. Chest CT follow-up could be obtained to confirm and exclude nodule Electronically Signed   By: Luke Bun M.D.   On: 07/24/2024 15:52      ASSESSMENT/PLAN:  Zoe [name] is a 89 y.o. female who presents to the clinic for evaluation for IgG lambda multiple myeloma.   # IgG Lambda Multiple Myeloma  --diagnosis of MM confirmed with bone marrow biopsy showing 60% plasma cells and anemia -- Bone survey shows no lytic lesions, kidney function is within normal limits.  --08/01/2022 was Cycle 1 Day 1 of VRd  - discontinued velcade  on 02/05/23 due to rash. She is status post day 15 cycle #9.  -changed the care plan to Daratumumab , Revlimid , and decadron . She is scheduled for day 1 cycle #1 today  -Started Cycle 1, Day 1 of Dara/Rev/Dex on 02/23/2023.  Plan:  --Labs from today show WBC 4.9, hemoglobin 10.5, MCV 86.9, platelets 140 --Most recent myeloma labs from 06/16/2024 showed M protein measuring 0.3 g/dL. Kappa light chain overall stable at 40.4, lambda light chain 35.9, Ratio 1.13   -- Continue Revlimid  5 mg daily 21 days of 28-day cycle.  --RTC in 4 weeks for labs and clinic visit and to reevaluate how she is doing on Revlimid .  #LE edema: --Resumed lasix  20 mg  daily for the last two days with potassium supplementation --Since edema is improving, advised to continue lasix  therapy and follow up with cardiology if symptoms don't improve.   #Hypotension/BP/Atrial Fibrillation  --Follows with cardiology, Dr. Ladona --Was recently admitted from 01/08/2024 through 01/14/2024 after having syncopal episode and found to have complete heart block.  She underwent pacemaker placement. --Patient's blood pressure is 129/68  today which is below her baseline. --Advised to check the blood pressure in the morning before taking her antihypertensives and follow-up with Dr. Ladona if her blood pressure is low or she is symptomatic. --Continue on Eliquis  5 mg BID   # Normocytic Anemia #Vitamin B12 Deficiency --Anemia likely driven by multiple myeloma, but may be component of vitamin B12 deficiency as well. --initial labs showed elevated MMA with B12 180 -- Continue vitamin B12 1000 mcg p.o. daily -- Continue to monitor   # H/O Left lower extremity neuropathy: --Currently on gabapentin  900 mg nightly and 600 mg in the morning --Encouraged to try water  aerobics and stretches.  --Following with Dr. Bobie (ortho) to see if she is a candidate for steroid injections.  -- Patient evaluated by Dr. Buckley thinks that this may be neuropathy worsened by her Velcade  therapy.   #Episode of right UE tremor and speech abnormality: --Evaluated by Dr. Buckley on 03/20/2023. Presumed etiology had been atrial fibrillation, but vascular disease, atheromatous changes, are also possibly implicated concurrently.  --MRI brain from 03/13/2023 show no acute finding including no evidence of intracranial neoplastic disease. There are chronic small-vessel ischemic changes of the pons, cerebral hemispheric white matter and right cerebellum. --Educated patient to seek immediate evaluate if there are repeat episodes. --No neurological deficits identified per exam today  #Supportive Care -- chemotherapy education complete.  -- port placement not required.  -- zofran  8mg  q8H PRN and compazine  10mg  PO q6H for nausea -- acyclovir  400mg  PO BID for VCZ prophylaxis -- tylenol  1000 mg q8H PRN for back pain.   No orders of the defined types were placed in this encounter.   Patient expressed understanding of the plan provided.   I have spent a total of 30  minutes minutes of face-to-face and non-face-to-face time, preparing to see the patient, performing  a medically appropriate examination, counseling and educating the patient, ordering tests/procedures, documenting clinical information in the electronic health record, and care coordination.   Norleen IVAR Kidney, MD Department of Hematology/Oncology [facility] Phone: [phone] Pager: [phone] Email: [email or web address]

## 2024-08-11 ENCOUNTER — Inpatient Hospital Stay: Attending: Physician Assistant

## 2024-08-11 ENCOUNTER — Inpatient Hospital Stay: Admitting: Hematology and Oncology

## 2024-08-11 ENCOUNTER — Encounter: Payer: Self-pay | Admitting: *Deleted

## 2024-08-11 VITALS — BP 140/80 | HR 60 | Temp 97.4°F | Resp 13 | Wt 168.5 lb

## 2024-08-11 DIAGNOSIS — R609 Edema, unspecified: Secondary | ICD-10-CM | POA: Diagnosis not present

## 2024-08-11 DIAGNOSIS — C9000 Multiple myeloma not having achieved remission: Secondary | ICD-10-CM | POA: Insufficient documentation

## 2024-08-11 DIAGNOSIS — Z7901 Long term (current) use of anticoagulants: Secondary | ICD-10-CM | POA: Diagnosis not present

## 2024-08-11 DIAGNOSIS — C9001 Multiple myeloma in remission: Secondary | ICD-10-CM | POA: Diagnosis not present

## 2024-08-11 DIAGNOSIS — E538 Deficiency of other specified B group vitamins: Secondary | ICD-10-CM | POA: Insufficient documentation

## 2024-08-11 DIAGNOSIS — I4891 Unspecified atrial fibrillation: Secondary | ICD-10-CM | POA: Insufficient documentation

## 2024-08-11 DIAGNOSIS — I959 Hypotension, unspecified: Secondary | ICD-10-CM | POA: Diagnosis not present

## 2024-08-11 DIAGNOSIS — D649 Anemia, unspecified: Secondary | ICD-10-CM

## 2024-08-11 DIAGNOSIS — G629 Polyneuropathy, unspecified: Secondary | ICD-10-CM

## 2024-08-11 LAB — CBC WITH DIFFERENTIAL (CANCER CENTER ONLY)
Abs Immature Granulocytes: 0.01 K/uL (ref 0.00–0.07)
Basophils Absolute: 0.1 K/uL (ref 0.0–0.1)
Basophils Relative: 1 %
Eosinophils Absolute: 0.1 K/uL (ref 0.0–0.5)
Eosinophils Relative: 3 %
HCT: 31.8 % — ABNORMAL LOW (ref 36.0–46.0)
Hemoglobin: 10.5 g/dL — ABNORMAL LOW (ref 12.0–15.0)
Immature Granulocytes: 0 %
Lymphocytes Relative: 16 %
Lymphs Abs: 0.8 K/uL (ref 0.7–4.0)
MCH: 28.7 pg (ref 26.0–34.0)
MCHC: 33 g/dL (ref 30.0–36.0)
MCV: 86.9 fL (ref 80.0–100.0)
Monocytes Absolute: 1 K/uL (ref 0.1–1.0)
Monocytes Relative: 21 %
Neutro Abs: 2.9 K/uL (ref 1.7–7.7)
Neutrophils Relative %: 59 %
Platelet Count: 140 K/uL — ABNORMAL LOW (ref 150–400)
RBC: 3.66 MIL/uL — ABNORMAL LOW (ref 3.87–5.11)
RDW: 16.8 % — ABNORMAL HIGH (ref 11.5–15.5)
WBC Count: 4.9 K/uL (ref 4.0–10.5)
nRBC: 0 % (ref 0.0–0.2)

## 2024-08-11 LAB — CMP (CANCER CENTER ONLY)
ALT: 6 U/L (ref 0–44)
AST: 12 U/L — ABNORMAL LOW (ref 15–41)
Albumin: 3.5 g/dL (ref 3.5–5.0)
Alkaline Phosphatase: 77 U/L (ref 38–126)
Anion gap: 7 (ref 5–15)
BUN: 27 mg/dL — ABNORMAL HIGH (ref 8–23)
CO2: 25 mmol/L (ref 22–32)
Calcium: 9.1 mg/dL (ref 8.9–10.3)
Chloride: 106 mmol/L (ref 98–111)
Creatinine: 1.13 mg/dL — ABNORMAL HIGH (ref 0.44–1.00)
GFR, Estimated: 47 mL/min — ABNORMAL LOW (ref >60)
Glucose, Bld: 132 mg/dL — ABNORMAL HIGH (ref 70–99)
Potassium: 3.8 mmol/L (ref 3.5–5.1)
Sodium: 138 mmol/L (ref 135–145)
Total Bilirubin: 0.5 mg/dL (ref 0.0–1.2)
Total Protein: 6.4 g/dL — ABNORMAL LOW (ref 6.5–8.1)

## 2024-08-11 LAB — LACTATE DEHYDROGENASE: LDH: 192 U/L (ref 98–192)

## 2024-08-11 NOTE — Progress Notes (Signed)
 Dental clearance form fax'd to [facility] as pt to start Zometa once clearance returned to us .

## 2024-08-12 ENCOUNTER — Encounter (INDEPENDENT_AMBULATORY_CARE_PROVIDER_SITE_OTHER): Admitting: Ophthalmology

## 2024-08-12 ENCOUNTER — Telehealth: Payer: Self-pay | Admitting: *Deleted

## 2024-08-12 DIAGNOSIS — I10 Essential (primary) hypertension: Secondary | ICD-10-CM | POA: Diagnosis not present

## 2024-08-12 DIAGNOSIS — H353231 Exudative age-related macular degeneration, bilateral, with active choroidal neovascularization: Secondary | ICD-10-CM

## 2024-08-12 DIAGNOSIS — H35033 Hypertensive retinopathy, bilateral: Secondary | ICD-10-CM | POA: Diagnosis not present

## 2024-08-12 DIAGNOSIS — H43813 Vitreous degeneration, bilateral: Secondary | ICD-10-CM | POA: Diagnosis not present

## 2024-08-12 LAB — KAPPA/LAMBDA LIGHT CHAINS
Kappa free light chain: 44.6 mg/L — ABNORMAL HIGH (ref 3.3–19.4)
Kappa, lambda light chain ratio: 1.13 (ref 0.26–1.65)
Lambda free light chains: 39.3 mg/L — ABNORMAL HIGH (ref 5.7–26.3)

## 2024-08-12 NOTE — Telephone Encounter (Signed)
 Received signed Dental Clearance for patient via fax from Dr. Donnice Dickens to receive Prolia/Xgeva/Zometa.  Dr. Federico informed. Form sent to HIM to be scanned into patient chart.

## 2024-08-14 LAB — MULTIPLE MYELOMA PANEL, SERUM
Albumin SerPl Elph-Mcnc: 3 g/dL (ref 2.9–4.4)
Albumin/Glob SerPl: 1.1 (ref 0.7–1.7)
Alpha 1: 0.3 g/dL (ref 0.0–0.4)
Alpha2 Glob SerPl Elph-Mcnc: 0.8 g/dL (ref 0.4–1.0)
B-Globulin SerPl Elph-Mcnc: 0.9 g/dL (ref 0.7–1.3)
Gamma Glob SerPl Elph-Mcnc: 0.9 g/dL (ref 0.4–1.8)
Globulin, Total: 2.9 g/dL (ref 2.2–3.9)
IgA: 305 mg/dL (ref 64–422)
IgG (Immunoglobin G), Serum: 1009 mg/dL (ref 586–1602)
IgM (Immunoglobulin M), Srm: 54 mg/dL (ref 26–217)
M Protein SerPl Elph-Mcnc: 0.2 g/dL — ABNORMAL HIGH
Total Protein ELP: 5.9 g/dL — ABNORMAL LOW (ref 6.0–8.5)

## 2024-08-18 ENCOUNTER — Telehealth: Payer: Self-pay | Admitting: *Deleted

## 2024-08-18 NOTE — Telephone Encounter (Signed)
 Received vm message from pt regarding her future appts. She did not think they looked right. Reviewed her schedule and her appts are not correct. Scheduling message sent to Shanda Pope to review and correct tomorrow. TCT patient. No answer but was able to leave vm message for her advising that we are working to correct her future appts.

## 2024-08-22 ENCOUNTER — Encounter: Payer: Self-pay | Admitting: Physician Assistant

## 2024-08-25 ENCOUNTER — Ambulatory Visit (INDEPENDENT_AMBULATORY_CARE_PROVIDER_SITE_OTHER)

## 2024-08-25 DIAGNOSIS — I442 Atrioventricular block, complete: Secondary | ICD-10-CM

## 2024-08-25 NOTE — Progress Notes (Deleted)
 Cardiology Office Note:    Date:  08/25/2024   ID:  Zoe [name], DOB 03-10-1934, MRN [id number]  PCP:  Gerome Brunet, DO   [facility] Providers Cardiologist:  Gordy Bergamo, MD Electrophysiologist:  Danelle Birmingham, MD { Click to update primary MD,subspecialty MD or APP then REFRESH:1}    Referring MD: Gerome Brunet, DO   No chief complaint on file. ***  History of Present Illness:    Zoe [name] is a 89 y.o. female with a hx of PAF, CHF, HTN, multiple myeloma, aortic stenosis s/p TAVR in 2019, prior embolic stroke on chronic anticoagulation. She has a hx of [place] syncope, CHB with PPM in place.   She dispatched EMS on 10/01/2023 in the setting of dizziness.  Per EMS, strip showed A-fib with RVR in the 115 to 130 range.  She was brought to [facility] ED for evaluation and was in sinus rhythm on arrival.  Per EMS, they had her perform vagal maneuvers and she converted.  She also described chest pressure that apparently resolved when she converted to sinus rhythm.  She was chest pain-free in the ER.  Troponin elevation was mild and flat, EKG was nonischemic.  Case discussed with cardiology who initially felt discharged without admission was appropriate with the addition of 2.5 mg Norvasc .  However patient had recurrent chest pain and she was admitted to medicine service.  Cardiac enzymes trended to 96, cardiology suspected demand ischemia in the setting of A-fib RVR.  Chest pain reproducible on palpation consistent with MSK pain.  IV heparin  was transition back to Eliquis  and she was discharged on 10/02/2023.  She was seen in the ER 07/24/24 with hypotension, she was discharged without admission.   She presents today for general cardiology follow up.      PAF Chronic anticoagulation - Maintaining sinus rhythm on metoprolol , 100 mg amiodarone  - No bleeding issues on Eliquis  appropriately dosed at 5 mg twice daily   Hypertension - 2.5 mg amlodipine , 25 mg  spironolactone , 25 mg losartan , 50 mg Lopressor    Aortic stenosis s/p TAVR - Stable valve function on echo   Chronic diastolic heart failure - Grade 2 DD - GDMT: Spironolactone , losartan , lasix  20 mg three times weekly   Mild carotid artery disease - stable on prior US  - continue statin   Hyperlipidemia with LDL goal < 70 04/01/2024: Cholesterol 159; HDL 64; LDL Cholesterol 84; Triglycerides 56; VLDL 11 Continue pravastatin     Past Medical History:  Diagnosis Date   Arthritis    some - per patient   Atrial fibrillation (HCC)    Breast cancer (HCC)    breast cancer / left    Cataract    bilat    GERD (gastroesophageal reflux disease)    History of kidney stones    Hyperlipidemia    Hypertension    Hypothyroidism    Macular degeneration    Left   S/P TAVR (transcatheter aortic valve replacement) 09/03/2018   23 mm Edwards Sapien 3 transcatheter heart valve placed via percutaneous right transfemoral approach    Severe aortic stenosis    Stress incontinence    Thyroid  disease    Tinnitus     Past Surgical History:  Procedure Laterality Date   ABDOMINAL HYSTERECTOMY  1970's   BACK SURGERY     BREAST LUMPECTOMY  12/1998   lumpectomy   CARDIAC CATHETERIZATION     CARDIOVERSION N/A 04/18/2023   Procedure: CARDIOVERSION;  Surgeon: Michele Richardson, DO;  Location: [facility]  INVASIVE CV LAB;  Service: Cardiovascular;  Laterality: N/A;   CARDIOVERSION N/A 04/20/2023   Procedure: CARDIOVERSION;  Surgeon: Ladona Heinz, MD;  Location: [facility] INVASIVE CV LAB;  Service: Cardiovascular;  Laterality: N/A;   EYE SURGERY     cataract surgery bilat    INTRAOPERATIVE TRANSTHORACIC ECHOCARDIOGRAM N/A 09/03/2018   Procedure: INTRAOPERATIVE TRANSTHORACIC ECHOCARDIOGRAM;  Surgeon: Verlin Lonni BIRCH, MD;  Location: [facility] OR;  Service: Open Heart Surgery;  Laterality: N/A;   KYPHOPLASTY N/A 09/07/2022   Procedure: THORACIC EIGHT KYPHOPLASTY;  Surgeon: Burnetta Aures, MD;  Location: [facility] OR;   Service: Orthopedics;  Laterality: N/A;  1 hr Local with [facility] 3 C-Bed   LITHOTRIPSY     PACEMAKER IMPLANT N/A 01/09/2024   Procedure: PACEMAKER IMPLANT;  Surgeon: Waddell Danelle ORN, MD;  Location: [facility] INVASIVE CV LAB;  Service: Cardiovascular;  Laterality: N/A;   Right total knee     2018 Dr. Ernie   RIGHT/LEFT HEART CATH AND CORONARY ANGIOGRAPHY N/A 08/06/2018   Procedure: RIGHT/LEFT HEART CATH AND CORONARY ANGIOGRAPHY;  Surgeon: Ladona Heinz, MD;  Location: [facility] INVASIVE CV LAB;  Service: Cardiovascular;  Laterality: N/A;   THYROIDECTOMY, PARTIAL  1975   TONSILLECTOMY     as a child - patient not sure of exact date   TOTAL KNEE ARTHROPLASTY Left 03/13/2016   Procedure: TOTAL KNEE ARTHROPLASTY;  Surgeon: Donnice Ernie, MD;  Location: [facility] ORS;  Service: Orthopedics;  Laterality: Left;   TOTAL KNEE ARTHROPLASTY Right 06/18/2017   Procedure: RIGHT TOTAL KNEE ARTHROPLASTY;  Surgeon: Ernie Donnice, MD;  Location: [facility] ORS;  Service: Orthopedics;  Laterality: Right;   TRANSCATHETER AORTIC VALVE REPLACEMENT, TRANSFEMORAL N/A 09/03/2018   Procedure: TRANSCATHETER AORTIC VALVE REPLACEMENT, TRANSFEMORAL;  Surgeon: Verlin Lonni BIRCH, MD;  Location: [facility] OR;  Service: Open Heart Surgery;  Laterality: N/A;    Current Medications: No outpatient medications have been marked as taking for the 08/27/24 encounter (Appointment) with Madie Jon Garre, PA.     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  Tobacco Use   Smoking status: Never   Smokeless tobacco: Never  Vaping Use   Vaping status: Never Used  Substance and Sexual Activity   Alcohol  use: No    Drug use: No   Sexual activity: Not Currently  Other Topics Concern   Not on file  Social History Narrative   Not on file   Social Drivers of Health   Financial Resource Strain: Low Risk  (11/12/2018)   Overall Financial Resource Strain (CARDIA)    Difficulty of Paying Living Expenses: Not very hard  Food Insecurity: No Food Insecurity (04/01/2024)   Hunger Vital Sign    Worried About Running Out of Food in the Last Year: Never true    Ran Out of Food in the Last Year: Never true  Transportation Needs: No Transportation Needs (04/01/2024)   PRAPARE - Administrator, Civil Service (Medical): No    Lack of Transportation (Non-Medical): No  Physical Activity: Inactive (11/12/2018)   Exercise Vital Sign    Days of Exercise per Week: 0 days    Minutes of Exercise per Session: 0  min  Stress: Stress Concern Present (11/12/2018)   [employer] of Occupational Health - Occupational Stress Questionnaire    Feeling of Stress : To some extent  Social Connections: Moderately Isolated (04/01/2024)   Social Connection and Isolation Panel    Frequency of Communication with Friends and Family: More than three times a week    Frequency of Social Gatherings with Friends and Family: Once a week    Attends Religious Services: Never    Database Administrator or Organizations: No    Attends Banker Meetings: 1 to 4 times per year    Marital Status: Widowed     Family History: The patient's ***family history includes Coronary artery disease in her father; Diabetes in her mother and sister; Heart disease in her father; Stroke in her mother.  ROS:   Please see the history of present illness.    *** All other systems reviewed and are negative.  EKGs/Labs/Other Studies Reviewed:    The following studies were reviewed today: ***      Recent Labs: 01/09/2024: TSH 0.891 07/24/2024: Magnesium  1.9 08/11/2024: ALT 6; BUN 27; Creatinine 1.13; Hemoglobin 10.5; Platelet Count  140; Potassium 3.8; Sodium 138  Recent Lipid Panel    Component Value Date/Time   CHOL 159 04/01/2024 0433   CHOL 103 07/16/2019 0941   TRIG 56 04/01/2024 0433   HDL 64 04/01/2024 0433   HDL 48 07/16/2019 0941   CHOLHDL 2.5 04/01/2024 0433   VLDL 11 04/01/2024 0433   LDLCALC 84 04/01/2024 0433   LDLCALC 39 07/16/2019 0941   LDLDIRECT 48 07/16/2019 0941     Risk Assessment/Calculations:   {Does this patient have ATRIAL FIBRILLATION?:[phone]}  No BP recorded.  {Refresh Note OR Click here to enter BP  :1}***         Physical Exam:    VS:  There were no vitals taken for this visit.    Wt Readings from Last 3 Encounters:  08/11/24 168 lb 8 oz (76.4 kg)  07/24/24 177 lb (80.3 kg)  07/15/24 177 lb 9.6 oz (80.6 kg)     GEN: *** Well nourished, well developed in no acute distress HEENT: Normal NECK: No JVD; No carotid bruits LYMPHATICS: No lymphadenopathy CARDIAC: ***RRR, no murmurs, rubs, gallops RESPIRATORY:  Clear to auscultation without rales, wheezing or rhonchi  ABDOMEN: Soft, non-tender, non-distended MUSCULOSKELETAL:  No edema; No deformity  SKIN: Warm and dry NEUROLOGIC:  Alert and oriented x 3 PSYCHIATRIC:  Normal affect   ASSESSMENT:    No diagnosis found. PLAN:    In order of problems listed above:  ***      {Are you ordering a CV Procedure (e.g. stress test, cath, DCCV, TEE, etc)?   Press F2        :[id number]}    Medication Adjustments/Labs and Tests Ordered: Current medicines are reviewed at length with the patient today.  Concerns regarding medicines are outlined above.  No orders of the defined types were placed in this encounter.  No orders of the defined types were placed in this encounter.   There are no Patient Instructions on file for this visit.   Signed, Jon Nat Hails, [name]  08/25/2024 7:43 AM    [facility]

## 2024-08-26 ENCOUNTER — Telehealth: Payer: Self-pay

## 2024-08-26 ENCOUNTER — Telehealth: Payer: Self-pay | Admitting: *Deleted

## 2024-08-26 LAB — CUP PACEART REMOTE DEVICE CHECK
Battery Remaining Longevity: 151 mo
Battery Voltage: 3.15 V
Brady Statistic AP VP Percent: 28.63 %
Brady Statistic AP VS Percent: 0 %
Brady Statistic AS VP Percent: 71.3 %
Brady Statistic AS VS Percent: 0.07 %
Brady Statistic RA Percent Paced: 28.6 %
Brady Statistic RV Percent Paced: 99.93 %
Date Time Interrogation Session: 20251102223327
Implantable Lead Connection Status: 753985
Implantable Lead Connection Status: 753985
Implantable Lead Implant Date: 20250319
Implantable Lead Implant Date: 20250319
Implantable Lead Location: 753859
Implantable Lead Location: 753860
Implantable Lead Model: 3830
Implantable Lead Model: 5076
Implantable Pulse Generator Implant Date: 20250319
Lead Channel Impedance Value: 285 Ohm
Lead Channel Impedance Value: 361 Ohm
Lead Channel Impedance Value: 418 Ohm
Lead Channel Impedance Value: 532 Ohm
Lead Channel Pacing Threshold Amplitude: 0.875 V
Lead Channel Pacing Threshold Amplitude: 0.875 V
Lead Channel Pacing Threshold Pulse Width: 0.4 ms
Lead Channel Pacing Threshold Pulse Width: 0.4 ms
Lead Channel Sensing Intrinsic Amplitude: 3.75 mV
Lead Channel Sensing Intrinsic Amplitude: 3.75 mV
Lead Channel Sensing Intrinsic Amplitude: 8.625 mV
Lead Channel Sensing Intrinsic Amplitude: 8.625 mV
Lead Channel Setting Pacing Amplitude: 1.5 V
Lead Channel Setting Pacing Amplitude: 2 V
Lead Channel Setting Pacing Pulse Width: 0.4 ms
Lead Channel Setting Sensing Sensitivity: 1.2 mV
Zone Setting Status: 755011

## 2024-08-26 NOTE — Telephone Encounter (Signed)
 No answer with pt call back as requested.   VM left with dates and times for upcoming appts with [facility] as requested.

## 2024-08-26 NOTE — Telephone Encounter (Signed)
 LMOVM regarding appointment on 08/27/24 with A. Duke, PA. Offered 2:45pm slot as provider has a meeting during pt's current 10:55am appointment slot. Direct number provided.  Also attempted home number, but it is out of service.

## 2024-08-27 ENCOUNTER — Encounter: Payer: Self-pay | Admitting: Physician Assistant

## 2024-08-27 ENCOUNTER — Ambulatory Visit: Admitting: Physician Assistant

## 2024-08-27 ENCOUNTER — Ambulatory Visit: Attending: Physician Assistant | Admitting: Physician Assistant

## 2024-08-27 VITALS — BP 126/58 | HR 62 | Ht 64.0 in | Wt 176.8 lb

## 2024-08-27 DIAGNOSIS — Z952 Presence of prosthetic heart valve: Secondary | ICD-10-CM | POA: Diagnosis not present

## 2024-08-27 DIAGNOSIS — I10 Essential (primary) hypertension: Secondary | ICD-10-CM

## 2024-08-27 DIAGNOSIS — Z950 Presence of cardiac pacemaker: Secondary | ICD-10-CM

## 2024-08-27 DIAGNOSIS — I480 Paroxysmal atrial fibrillation: Secondary | ICD-10-CM

## 2024-08-27 DIAGNOSIS — R5383 Other fatigue: Secondary | ICD-10-CM | POA: Diagnosis not present

## 2024-08-27 DIAGNOSIS — E785 Hyperlipidemia, unspecified: Secondary | ICD-10-CM

## 2024-08-27 MED ORDER — LOSARTAN POTASSIUM 50 MG PO TABS: 50.0000 mg | ORAL_TABLET | Freq: Every day | ORAL | Status: DC

## 2024-08-27 NOTE — Progress Notes (Signed)
 Cardiology Office Note   Date:  08/27/2024  ID:  Anara  JOVANI COLQUHOUN, DOB 1934/03/26, MRN [id number] PCP: Gerome Brunet, DO  [facility] Providers Cardiologist:  Gordy Bergamo, MD Electrophysiologist:  Danelle Birmingham, MD     History of Present Illness Zoe [name] is a 89 y.o. female with past medical history of mild coronary spasm by coronary angiography 2014, severe AS s/p TAVR by Dr. Dusty 09/03/2018, carotid artery disease, PAF, hypertension, hyperlipidemia, CKD stage III, history of hyponatremia, history of CVA, CHB s/p Medtronic PPM and chronic diastolic heart failure.  Patient was admitted to the hospital in December 2024 with A-fib with RVR, heart rate was in the 110s to 130s.  He was brought to [facility], ED and was in sinus rhythm on arrival.  Per EMS, they performed a vagal maneuver on the patient and she converted.  Troponin was mildly elevated but remained flat.  EKG was nonischemic.  Initially, the plan was to discharge the patient on Norvasc .  However she developed recurrent chest pain and troponin trended up to 96.  Cardiology service suspected demand ischemia in the setting of A-fib with RVR.  The chest pain was reproducible and consistent with musculoskeletal pain.  She has been on amiodarone  and Eliquis  for her A-fib.  She was admitted in March 2025 after a fall.  She was noted to have complete heart block and underwent pacemaker implant.  Troponin was elevated after pacer implantation.  Echocardiogram obtained on 01/09/2024 showed EF of 60 to 65%, grade 2 DD, normal RV, severely dilated left atrium, mild MR, mild AI with stable 23 mm SAPIEN bioprosthetic valve in the aortic position.  She had chest discomfort consistent with musculoskeletal pain.  Patient was last seen by Dr. Bergamo on 02/21/2024 at which time she was dealing with balance issue and swelling.  She takes furosemide  20 mg 3 times a week for her swelling.  It was recommended to increase Lasix  to daily dosing if weight  increased by more than 3 pounds overnight or dyspnea worsens.  Subsequent carotid Doppler shows no significant carotid artery disease.  She was admitted with TIA in June 2025.  CT of the head showed no acute abnormality, remote infarct in the right cerebellum, chronic microvascular ischemic changes.  CT of the head and neck showed findings suggestive of fibromuscular dysplasia of bilateral cervical ICA.  MRI was negative for acute process.  She has been seen by Dr. Birmingham on 05/28/2024 at which time she was doing well.  Despite her history of TIA, there has been no evidence of A-fib on previous device interrogation.  Since the last visit, patient was seen in the ED on 07/24/2024 for chest pain and dizziness.  She currently resides at [facility] independent living facility.  Blood work on arrival showed creatinine of 1.48, BUN 31.  Normal magnesium  level.  Hemoglobin 11.0.  Troponin 17.  Chest x-ray showed cardiomegaly with mild atelectasis.  Patient presents today for 44-month follow-up.  She denies any chest pain.  She is very active for her age at the independent living facility.  Her facility manage her medication.  She does feel weak and tired in the initial few hours after she take her morning medication.  She is not on metoprolol  tartrate is listed in her ER record, instead she is on 50 mg metoprolol  succinate.  In the morning, she is given losartan  50 mg, metoprolol  succinate 50 mg and amlodipine  5 mg all at once.  I recommended moved to  losartan  and amlodipine  to nighttime and to keep metoprolol  succinate for the morning.  This will make her blood pressure more even and prevent significant drop in the blood pressure in the morning.  She does feel short of breath at baseline, however this is chronic.  She appears to have 1+ right lower extremity pitting edema but no left lower extremity edema.  Overall, she is quite stable.  She only uses Lasix  as needed.  In the last 2 weeks, she has not used any Lasix .  I  recommended continue on the current therapy with above changes.  She can follow-up with Dr. Ladona in 6 months.  Based on recent device interrogation, she had 1 episode of atrial fibrillation.  She has been on Eliquis  5 mg twice a day.  Heart rate is quite regular on physical exam today   ROS:   She denies chest pain, palpitations, dyspnea, pnd, orthopnea, n, v, dizziness, syncope, edema, weight gain, or early satiety. All other systems reviewed and are otherwise negative except as noted above.   Patient complains of fatigue.  Studies Reviewed      Cardiac Studies & Procedures   ______________________________________________________________________________________________     ECHOCARDIOGRAM  ECHOCARDIOGRAM COMPLETE 01/09/2024  Narrative ECHOCARDIOGRAM REPORT    Patient Name:   Zoe [name] Date of Exam: 01/09/2024 Medical Rec #:  [id number]           Height:       63.0 in Accession #:    [id number]          Weight:       165.0 lb Date of Birth:  [birth year]            BSA:          1.782 m Patient Age:    89 years            BP:           171/78 mmHg Patient Gender: F                   HR:           59 bpm. Exam Location:  Inpatient  Procedure: 2D Echo, Cardiac Doppler and Color Doppler (Both Spectral and Color Flow Doppler were utilized during procedure).  Indications:    Chest Pain R07.9  History:        Patient has prior history of Echocardiogram examinations, most recent 04/16/2023. CHF, Previous Myocardial Infarction and Angina, CKD, stage 3, Arrythmias:Atrial Fibrillation and Atrial Flutter, Signs/Symptoms:Syncope; Risk Factors:Hypertension and Dyslipidemia. Aortic Valve: 23 mm Sapien valve is present in the aortic position. Procedure Date: 09/03/2018.  Sonographer:    Thea Norlander RCS Referring Phys: [id number] MATTHEW A CARLISLE  IMPRESSIONS   1. Left ventricular ejection fraction, by estimation, is 60 to 65%. The left ventricle has normal function. The  left ventricle has no regional wall motion abnormalities. Left ventricular diastolic parameters are consistent with Grade II diastolic dysfunction (pseudonormalization). Elevated left ventricular end-diastolic pressure. 2. Right ventricular systolic function is normal. The right ventricular size is normal. There is normal pulmonary artery systolic pressure. 3. Left atrial size was severely dilated. 4. The mitral valve is normal in structure. Mild mitral valve regurgitation. No evidence of mitral stenosis. Moderate mitral annular calcification. 5. The aortic valve has been repaired/replaced. Aortic valve regurgitation is mild. No aortic stenosis is present. There is a 23 mm Sapien valve present in the aortic position. Procedure Date: 09/03/2018. 6. The inferior vena cava is  normal in size with greater than 50% respiratory variability, suggesting right atrial pressure of 3 mmHg.  FINDINGS Left Ventricle: Left ventricular ejection fraction, by estimation, is 60 to 65%. The left ventricle has normal function. The left ventricle has no regional wall motion abnormalities. The left ventricular internal cavity size was normal in size. There is no left ventricular hypertrophy. Left ventricular diastolic parameters are consistent with Grade II diastolic dysfunction (pseudonormalization). Elevated left ventricular end-diastolic pressure.  Right Ventricle: The right ventricular size is normal. No increase in right ventricular wall thickness. Right ventricular systolic function is normal. There is normal pulmonary artery systolic pressure. The tricuspid regurgitant velocity is 2.39 m/s, and with an assumed right atrial pressure of 3 mmHg, the estimated right ventricular systolic pressure is 25.8 mmHg.  Left Atrium: Left atrial size was severely dilated.  Right Atrium: Right atrial size was normal in size.  Pericardium: There is no evidence of pericardial effusion.  Mitral Valve: The mitral valve is normal in  structure. Moderate mitral annular calcification. Mild mitral valve regurgitation. No evidence of mitral valve stenosis.  Tricuspid Valve: The tricuspid valve is normal in structure. Tricuspid valve regurgitation is mild . No evidence of tricuspid stenosis.  Aortic Valve: The aortic valve has been repaired/replaced. Aortic valve regurgitation is mild. No aortic stenosis is present. Aortic valve mean gradient measures 9.0 mmHg. Aortic valve peak gradient measures 17.1 mmHg. Aortic valve area, by VTI measures 1.00 cm. There is a 23 mm Sapien valve present in the aortic position. Procedure Date: 09/03/2018.  Pulmonic Valve: The pulmonic valve was normal in structure. Pulmonic valve regurgitation is trivial. No evidence of pulmonic stenosis.  Aorta: The aortic root is normal in size and structure.  Venous: The inferior vena cava is normal in size with greater than 50% respiratory variability, suggesting right atrial pressure of 3 mmHg.  IAS/Shunts: No atrial level shunt detected by color flow Doppler.   LEFT VENTRICLE PLAX 2D LVIDd:         4.30 cm   Diastology LVIDs:         2.80 cm   LV e' medial:    0.05 cm/s LV PW:         1.30 cm   LV E/e' medial:  25.7 LV IVS:        1.10 cm   LV e' lateral:   0.06 cm/s LVOT diam:     1.70 cm   LV E/e' lateral: 23.7 LV SV:         51 LV SV Index:   29 LVOT Area:     2.27 cm   RIGHT VENTRICLE             IVC RV S prime:     12.00 cm/s  IVC diam: 1.60 cm TAPSE (M-mode): 2.3 cm  LEFT ATRIUM             Index        RIGHT ATRIUM           Index LA diam:        4.40 cm 2.47 cm/m   RA Area:     16.20 cm LA Vol (A2C):   92.5 ml 51.91 ml/m  RA Volume:   36.00 ml  20.20 ml/m LA Vol (A4C):   61.0 ml 34.26 ml/m LA Biplane Vol: 75.9 ml 42.60 ml/m AORTIC VALVE                     PULMONIC VALVE  AV Area (Vmax):    1.03 cm      PR End Diast Vel: 5.66 msec AV Area (Vmean):   1.01 cm AV Area (VTI):     1.00 cm AV Vmax:           207.00 cm/s AV  Vmean:          139.000 cm/s AV VTI:            0.509 m AV Peak Grad:      17.1 mmHg AV Mean Grad:      9.0 mmHg LVOT Vmax:         93.90 cm/s LVOT Vmean:        61.900 cm/s LVOT VTI:          0.225 m LVOT/AV VTI ratio: 0.44  AORTA Ao Root diam: 2.30 cm Ao Asc diam:  3.10 cm  MITRAL VALVE                TRICUSPID VALVE MV Area (PHT): 3.72 cm     TR Peak grad:   22.8 mmHg MV Decel Time: 204 msec     TR Vmax:        239.00 cm/s MR Peak grad: 87.6 mmHg MR Vmax:      468.00 cm/s   SHUNTS MV E velocity: 1.37 cm/s    Systemic VTI:  0.22 m MV A velocity: 106.00 cm/s  Systemic Diam: 1.70 cm MV E/A ratio:  0.01  Annabella Scarce MD Electronically signed by Annabella Scarce MD Signature Date/Time: 01/09/2024/4:04:13 PM    Final          ______________________________________________________________________________________________      Risk Assessment/Calculations  CHA2DS2-VASc Score = 8   This indicates a 10.8% annual risk of stroke. The patient's score is based upon: CHF History: 1 HTN History: 1 Diabetes History: 0 Stroke History: 2 Vascular Disease History: 1 Age Score: 2 Gender Score: 1            Physical Exam VS:  BP (!) 126/58   Pulse 62   Ht 5' 4 (1.626 m)   Wt 176 lb 12.8 oz (80.2 kg)   SpO2 98%   BMI 30.35 kg/m        Wt Readings from Last 3 Encounters:  08/27/24 176 lb 12.8 oz (80.2 kg)  08/11/24 168 lb 8 oz (76.4 kg)  07/24/24 177 lb (80.3 kg)    GEN: Well nourished, well developed in no acute distress NECK: No JVD; No carotid bruits CARDIAC: RRR, no murmurs, rubs, gallops RESPIRATORY:  Clear to auscultation without rales, wheezing or rhonchi  ABDOMEN: Soft, non-tender, non-distended EXTREMITIES:  No edema; No deformity   ASSESSMENT AND PLAN  Fatigue: Symptoms typically occur in the morning right after she take her medication.  She is currently on 50 mg metoprolol  succinate, 50 mg losartan  and 5 mg amlodipine  every morning.  I  recommended move losartan  and amlodipine  to nighttime and to keep metoprolol  succinate in the morning.  This will keep the blood pressure more even during the day.  History of TAVR: Stable on last echocardiogram.  Paroxysmal atrial fibrillation: Recent device interrogation showed a single episode of A-fib.  She is currently on Eliquis  and amiodarone  100 mg daily  Hypertension: Blood pressure stable  Hyperlipidemia: On rosuvastatin   History of Medtronic pacemaker: Followed by EP service.  Recent device interrogation showed stable device function.       Dispo: Follow-up with Dr. Ladona in 6 months.  Signed, Cleotis Sparr,  PA

## 2024-08-27 NOTE — Patient Instructions (Signed)
 Medication Instructions:  Amlodipine  5 mg daily (TAKE at NIGHT) Losartan  50 mg daily (TAKE at NIGHT)  Metoprolol  Succinate 50 mg daily (TAKE in MORNING) *If you need a refill on your cardiac medications before your next appointment, please call your pharmacy*  Lab Work: NONE If you have labs (blood work) drawn today and your tests are completely normal, you will receive your results only by: MyChart Message (if you have MyChart) OR A paper copy in the mail If you have any lab test that is abnormal or we need to change your treatment, we will call you to review the results.  Testing/Procedures: NONE  Follow-Up: At [facility], you and your health needs are our priority.  As part of our continuing mission to provide you with exceptional heart care, our providers are all part of one team.  This team includes your primary Cardiologist (physician) and Advanced Practice Providers or APPs (Physician Assistants and Nurse Practitioners) who all work together to provide you with the care you need, when you need it.  Your next appointment:   6 month(s)  Provider:   Gordy Bergamo, MD

## 2024-08-27 NOTE — Progress Notes (Signed)
 Remote PPM Transmission

## 2024-08-28 ENCOUNTER — Ambulatory Visit: Payer: Self-pay | Admitting: Internal Medicine

## 2024-09-02 ENCOUNTER — Other Ambulatory Visit: Payer: Self-pay | Admitting: *Deleted

## 2024-09-02 DIAGNOSIS — C9000 Multiple myeloma not having achieved remission: Secondary | ICD-10-CM

## 2024-09-02 MED ORDER — LENALIDOMIDE 5 MG PO CAPS: 5.0000 mg | ORAL_CAPSULE | Freq: Every day | ORAL | 0 refills | Status: DC

## 2024-09-02 NOTE — Telephone Encounter (Signed)
 Revlimid  refilled per MD OV note 08/11/24 - Continue Revlimid  5 mg daily 21 days of 28-day cycle

## 2024-09-08 ENCOUNTER — Other Ambulatory Visit: Payer: Self-pay | Admitting: Physician Assistant

## 2024-09-08 DIAGNOSIS — C9001 Multiple myeloma in remission: Secondary | ICD-10-CM

## 2024-09-09 ENCOUNTER — Other Ambulatory Visit: Payer: Self-pay

## 2024-09-09 ENCOUNTER — Inpatient Hospital Stay

## 2024-09-09 ENCOUNTER — Inpatient Hospital Stay: Attending: Physician Assistant | Admitting: Physician Assistant

## 2024-09-09 VITALS — BP 133/70 | HR 61 | Temp 97.5°F | Resp 13 | Wt 176.4 lb

## 2024-09-09 DIAGNOSIS — E538 Deficiency of other specified B group vitamins: Secondary | ICD-10-CM | POA: Diagnosis not present

## 2024-09-09 DIAGNOSIS — C9000 Multiple myeloma not having achieved remission: Secondary | ICD-10-CM | POA: Diagnosis not present

## 2024-09-09 DIAGNOSIS — Z7901 Long term (current) use of anticoagulants: Secondary | ICD-10-CM | POA: Diagnosis not present

## 2024-09-09 DIAGNOSIS — I4891 Unspecified atrial fibrillation: Secondary | ICD-10-CM | POA: Insufficient documentation

## 2024-09-09 DIAGNOSIS — R600 Localized edema: Secondary | ICD-10-CM | POA: Insufficient documentation

## 2024-09-09 DIAGNOSIS — R251 Tremor, unspecified: Secondary | ICD-10-CM | POA: Insufficient documentation

## 2024-09-09 DIAGNOSIS — D649 Anemia, unspecified: Secondary | ICD-10-CM | POA: Diagnosis not present

## 2024-09-09 DIAGNOSIS — I959 Hypotension, unspecified: Secondary | ICD-10-CM | POA: Diagnosis not present

## 2024-09-09 LAB — CBC WITH DIFFERENTIAL (CANCER CENTER ONLY)
Abs Immature Granulocytes: 0.01 K/uL (ref 0.00–0.07)
Basophils Absolute: 0.1 K/uL (ref 0.0–0.1)
Basophils Relative: 1 %
Eosinophils Absolute: 0.1 K/uL (ref 0.0–0.5)
Eosinophils Relative: 2 %
HCT: 32.1 % — ABNORMAL LOW (ref 36.0–46.0)
Hemoglobin: 10.4 g/dL — ABNORMAL LOW (ref 12.0–15.0)
Immature Granulocytes: 0 %
Lymphocytes Relative: 17 %
Lymphs Abs: 0.8 K/uL (ref 0.7–4.0)
MCH: 28 pg (ref 26.0–34.0)
MCHC: 32.4 g/dL (ref 30.0–36.0)
MCV: 86.3 fL (ref 80.0–100.0)
Monocytes Absolute: 1 K/uL (ref 0.1–1.0)
Monocytes Relative: 19 %
Neutro Abs: 3.1 K/uL (ref 1.7–7.7)
Neutrophils Relative %: 61 %
Platelet Count: 163 K/uL (ref 150–400)
RBC: 3.72 MIL/uL — ABNORMAL LOW (ref 3.87–5.11)
RDW: 16.6 % — ABNORMAL HIGH (ref 11.5–15.5)
WBC Count: 5.1 K/uL (ref 4.0–10.5)
nRBC: 0 % (ref 0.0–0.2)

## 2024-09-09 LAB — CMP (CANCER CENTER ONLY)
ALT: 7 U/L (ref 0–44)
AST: 11 U/L — ABNORMAL LOW (ref 15–41)
Albumin: 3.8 g/dL (ref 3.5–5.0)
Alkaline Phosphatase: 88 U/L (ref 38–126)
Anion gap: 7 (ref 5–15)
BUN: 23 mg/dL (ref 8–23)
CO2: 26 mmol/L (ref 22–32)
Calcium: 8.8 mg/dL — ABNORMAL LOW (ref 8.9–10.3)
Chloride: 104 mmol/L (ref 98–111)
Creatinine: 1.14 mg/dL — ABNORMAL HIGH (ref 0.44–1.00)
GFR, Estimated: 46 mL/min — ABNORMAL LOW (ref >60)
Glucose, Bld: 108 mg/dL — ABNORMAL HIGH (ref 70–99)
Potassium: 3.6 mmol/L (ref 3.5–5.1)
Sodium: 137 mmol/L (ref 135–145)
Total Bilirubin: 0.8 mg/dL (ref 0.0–1.2)
Total Protein: 6.9 g/dL (ref 6.5–8.1)

## 2024-09-09 LAB — LACTATE DEHYDROGENASE: LDH: 250 U/L — ABNORMAL HIGH (ref 105–235)

## 2024-09-09 NOTE — Progress Notes (Signed)
 [facility] [phone]   [phone]   PROGRESS NOTE   Patient Care Team: [facility], [facility] as PCP - General (Rheumatology)   Hematological/Oncological History # IgG Lambda Multiple Myeloma  06/14/2022: establish care with [facility] due to anemia. Labs showed M protein 4.9, Kappa 7.2, Lambda 10.8, ratio 0.67 07/13/2022: Bmbx showed Lambda restricted plasma cell neoplasm involving approximately 60% of the cellular marrow by IHC on the biopsy.  08/01/2022: Cycle 1 Day 1 of VRd chemotherapy. (Holding revlimid  initially) 08/21/2022:  Cycle 2 Day 1 of VRd chemotherapy. (Holding revlimid ) 09/04/2022: Cycle 3 Day 1 of VRd chemotherapy. Started revlimid  10/03/2022: Cycle 4 Day 1 of VRd chemotherapy 10/31/2022: Cycle 5 Day 1 of VRd chemotherapy 11/20/2022: Cycle 6 Day 1 of VRd chemotherapy 12/11/2022: Cycle 7 Day 1 of VRd chemotherapy 01/02/2023: Cycle 8 Day 1 of VRd chemotherapy 01/22/2023: Cycle 9 Day 1 of VRd chemotherapy 02/23/2023: Day 1 Cycle 1 of Dara/Rev/Dex 03/23/2023: Day 1 Cycle 2 of Dara/Rev/Dex 05/06/2023: chemotherapy on pause due to chest pain/cardiac/respiratory issues.  05/18/2023: Cycle 3 Day 1 of Dara/Rev/Dex 06/15/2023: Cycle 4 Day 1 of Dara/Rev/Dex 07/13/2023: Cycle 5 Day 1 of Dara/Rev/Dex 08/10/2023: Cycle 6 Day 1 of Dara/Rev/Dex. Transition to maintenance revlimid .   INTERVAL HISTORY: Zoe [name] 89 y.o. female returns to the clinic today for a follow-up visit accompanied by a friend.   Her last visit was on 08/11/2024. In the interim, she has had no major changes in her health.   Zoe [name] reports she has been well overall in the interim since her last visit.  She reports energy and appetite are overall unchanged. She has some arthritic pain mainly in the knees and lower legs. She continues to use a walker or wheelchair to assist with ambulation. She does have some dizziness mainly in the morning which has improved after  taking her BP meds in the evening. She denies any syncopal episodes. She was established with a dentist and plans to have a crown placed and a filling. She denies nausea, vomiting or bowel habit changes. She denies easy bruising or overt signs of bleeding. She denies fevers, chills, sweats, shortness of breath, chest pain or cough. Rest of the ROS is below.   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  MEDICATIONS:  Current Outpatient Medications  Medication Sig Dispense Refill   acetaminophen  (TYLENOL ) 500 MG tablet Take 1,000 mg by mouth every 6 (six) hours as needed.     albuterol  (VENTOLIN  HFA) 108 (90 Base) MCG/ACT inhaler Inhale 2 puffs into the lungs every 6 (six) hours as needed for wheezing or shortness of breath.     amiodarone  (PACERONE ) 100 MG tablet Take 100 mg by mouth daily.  amLODipine  (NORVASC ) 5 MG tablet Take 5 mg by mouth daily. Take at night     apixaban  (ELIQUIS ) 5 MG TABS tablet Take 1 tablet (5 mg total) by mouth 2 (two) times daily. 180 tablet 1   Artificial Tear Solution (SOOTHE XP) SOLN Place 1 drop into both eyes every evening.     Cholecalciferol  (VITAMIN D3) 50 MCG (2000 UT) capsule Take 1 capsule (2,000 Units total) by mouth daily.     [facility] VITAMIN B12 1000 MCG tablet TAKE 1 TABLET BY MOUTH EVERY DAY 90 tablet 1   furosemide  (LASIX ) 20  MG tablet Three times a week as needed; take potassium if taking lasix      lenalidomide  (REVLIMID ) 5 MG capsule Take 1 capsule (5 mg total) by mouth daily. Take 1 capsule daily for 21 days and then none for 7 days. 21 capsule 0   levothyroxine  (SYNTHROID , LEVOTHROID) 100 MCG tablet Take 100 mcg by mouth daily before breakfast.  2   losartan  (COZAAR ) 50 MG tablet Take 1 tablet (50 mg total) by mouth daily. Take at night     metoprolol  succinate (TOPROL -XL) 50 MG 24 hr tablet Take 50 mg by mouth daily. Take with or immediately following a meal. Take in the Morning     Multiple Vitamins-Minerals (PRESERVISION AREDS) CAPS Take 1 capsule by mouth 2 (two) times daily.     pantoprazole  (PROTONIX ) 40 MG tablet Take 1 tablet (40 mg total) by mouth daily.     potassium chloride  SA (KLOR-CON  M) 20 MEQ tablet Three times a week as needed only if taking lasix      rosuvastatin  (CRESTOR ) 20 MG tablet Take 1 tablet (20 mg total) by mouth daily. 90 tablet 3   No current facility-administered medications for this visit.    SURGICAL HISTORY:  Past Surgical History:  Procedure Laterality Date   ABDOMINAL HYSTERECTOMY  1970's   BACK SURGERY     BREAST LUMPECTOMY  12/1998   lumpectomy   CARDIAC CATHETERIZATION     CARDIOVERSION N/A 04/18/2023   Procedure: CARDIOVERSION;  Surgeon: Michele Richardson, DO;  Location: [facility];  Service: Cardiovascular;  Laterality: N/A;   CARDIOVERSION N/A 04/20/2023   Procedure: CARDIOVERSION;  Surgeon: Ladona Heinz, MD;  Location: [facility];  Service: Cardiovascular;  Laterality: N/A;   EYE SURGERY     cataract surgery bilat    INTRAOPERATIVE TRANSTHORACIC ECHOCARDIOGRAM N/A 09/03/2018   Procedure: INTRAOPERATIVE TRANSTHORACIC ECHOCARDIOGRAM;  Surgeon: Verlin Lonni BIRCH, MD;  Location: [facility] OR;  Service: Open Heart Surgery;  Laterality: N/A;   KYPHOPLASTY N/A 09/07/2022   Procedure: THORACIC EIGHT KYPHOPLASTY;  Surgeon: Burnetta Aures, MD;  Location: [facility] OR;   Service: Orthopedics;  Laterality: N/A;  1 hr Local with IV [facility] 3 C-Bed   LITHOTRIPSY     PACEMAKER IMPLANT N/A 01/09/2024   Procedure: PACEMAKER IMPLANT;  Surgeon: Waddell Danelle ORN, MD;  Location: [facility];  Service: Cardiovascular;  Laterality: N/A;   Right total knee     2018 Dr. Ernie   RIGHT/LEFT HEART CATH AND CORONARY ANGIOGRAPHY N/A 08/06/2018   Procedure: RIGHT/LEFT HEART CATH AND CORONARY ANGIOGRAPHY;  Surgeon: Ladona Heinz, MD;  Location: [facility];  Service: Cardiovascular;  Laterality: N/A;   THYROIDECTOMY, PARTIAL  1975   TONSILLECTOMY     as a child - patient not sure of exact date   TOTAL KNEE ARTHROPLASTY Left 03/13/2016   Procedure: TOTAL KNEE ARTHROPLASTY;  Surgeon: Donnice Ernie, MD;  Location: [facility];  Service: Orthopedics;  Laterality: Left;   TOTAL KNEE ARTHROPLASTY Right 06/18/2017   Procedure: RIGHT TOTAL KNEE ARTHROPLASTY;  Surgeon: Ernie Cough, MD;  Location: [facility];  Service: Orthopedics;  Laterality: Right;   TRANSCATHETER AORTIC VALVE REPLACEMENT, TRANSFEMORAL N/A 09/03/2018   Procedure: TRANSCATHETER AORTIC VALVE REPLACEMENT, TRANSFEMORAL;  Surgeon: Verlin Lonni BIRCH, MD;  Location: [facility] OR;  Service: Open Heart Surgery;  Laterality: N/A;   REVIEW OF SYSTEMS:   Constitutional: ( - ) fevers, ( - )  chills , ( - ) night sweats Eyes: ( - ) blurriness of vision, ( - ) double vision, ( - ) watery eyes Ears, nose, mouth, throat, and face: ( - ) mucositis, ( - ) sore throat Respiratory: ( -) cough, ( - ) dyspnea, ( - ) wheezes Cardiovascular: ( - ) palpitation, ( - ) chest discomfort, ( +) lower extremity swelling Gastrointestinal:  ( - ) nausea, ( - ) heartburn, ( - ) change in bowel habits Skin: ( - ) abnormal skin rashes Lymphatics: ( - ) new lymphadenopathy, ( - ) easy bruising Neurological: ( - ) numbness, ( - ) tingling, ( - ) new weaknesses Behavioral/Psych: ( - ) mood change, ( - ) new changes  All other systems were reviewed  with the patient and are negative.   PHYSICAL EXAMINATION:  Blood pressure 133/70, pulse 61, temperature (!) 97.5 F (36.4 C), temperature source Temporal, resp. rate 13, weight 176 lb 6.4 oz (80 kg), SpO2 96%.  ECOG PERFORMANCE STATUS: 1  GENERAL: well appearing elderly Caucasian female, alert, no distress and comfortable SKIN: skin color, texture, turgor are normal, no rashes or significant lesions EYES: conjunctiva are pink and non-injected, sclera clear LUNGS:normal breathing effort.  HEART: irregular rhythm ,normal rate and no murmurs. Bilateral ankle edema.  Musculoskeletal: no cyanosis of digits and no clubbing  PSYCH: alert & oriented x 3, fluent speech NEURO: no focal motor/sensory deficits    LABORATORY DATA: Lab Results  Component Value Date   WBC 5.1 09/09/2024   HGB 10.4 (L) 09/09/2024   HCT 32.1 (L) 09/09/2024   MCV 86.3 09/09/2024   PLT 163 09/09/2024      Chemistry      Component Value Date/Time   NA 137 09/09/2024 1103   NA 130 (L) 06/19/2022 1012   K 3.6 09/09/2024 1103   CL 104 09/09/2024 1103   CO2 26 09/09/2024 1103   BUN 23 09/09/2024 1103   BUN 21 06/19/2022 1012   CREATININE 1.14 (H) 09/09/2024 1103      Component Value Date/Time   CALCIUM  8.8 (L) 09/09/2024 1103   ALKPHOS 88 09/09/2024 1103   AST 11 (L) 09/09/2024 1103   ALT 7 09/09/2024 1103   BILITOT 0.8 09/09/2024 1103      / RADIOGRAPHIC STUDIES:  CUP PACEART REMOTE DEVICE CHECK Result Date: 08/26/2024 PPM Scheduled remote reviewed. Normal device function.  Presenting rhythm:  AS/VP 1 AF/AFL, controlled rates, duration 1hr , Eliquis  per PA report Next remote transmission per protocol. LA, CVRS     ASSESSMENT/PLAN:  Justyce  B Diguglielmo is a 89 y.o. female who presents to the clinic for evaluation for IgG lambda multiple myeloma.   # IgG Lambda Multiple Myeloma  --diagnosis of MM confirmed with bone marrow biopsy showing 60% plasma cells and anemia -- Bone survey shows no  lytic lesions, kidney function is within normal limits.  --08/01/2022 was Cycle 1 Day 1 of VRd  - discontinued velcade  on 02/05/23 due to  rash. She is status post day 15 cycle #9.  -changed the care plan to Daratumumab , Revlimid , and decadron . She is scheduled for day 1 cycle #1 today  -Started Cycle 1, Day 1 of Dara/Rev/Dex on 02/23/2023.  Plan:  --Labs from today show WBC 5.1, Hgb 10.4, Plt 163, creatinine stable at 1.14, LFTs in range.  --Most recent myeloma labs from 08/11/2024 showed M protein measuring 0.2 g/dL. Kappa light chain overall stable at 44.6, lambda light chain 39.3, Ratio 1.13   -- Continue Revlimid  5 mg daily 21 days of 28-day cycle.  --RTC in 4 weeks for labs and clinic visit and to reevaluate how she is doing on Revlimid .  #LE edema-stable: --Takes lasix  20 mg  daily as needed with potassium supplementation  #Hypotension/BP/Atrial Fibrillation  --Follows with cardiology, Dr. Ladona --Was recently admitted from 01/08/2024 through 01/14/2024 after having syncopal episode and found to have complete heart block.  She underwent pacemaker placement. --Patient's blood pressure is 133/70 today which is below her baseline. --Advised to check the blood pressure in the morning before taking her antihypertensives and follow-up with Dr. Ladona if her blood pressure is low or she is symptomatic. --Continue on Eliquis  5 mg BID   # Normocytic Anemia #Vitamin B12 Deficiency --Anemia likely driven by multiple myeloma, but may be component of vitamin B12 deficiency as well. --initial labs showed elevated MMA with B12 180 -- Continue vitamin B12 1000 mcg p.o. daily -- Continue to monitor   # H/O Left lower extremity neuropathy: --Currently on gabapentin  900 mg nightly and 600 mg in the morning --Encouraged to try water  aerobics and stretches.  --Following with Dr. Bobie (ortho) to see if she is a candidate for steroid injections.  -- Patient evaluated by Dr. Buckley thinks that this may be  neuropathy worsened by her Velcade  therapy.   #Episode of right UE tremor and speech abnormality: --Evaluated by Dr. Buckley on 03/20/2023. Presumed etiology had been atrial fibrillation, but vascular disease, atheromatous changes, are also possibly implicated concurrently.  --MRI brain from 03/13/2023 show no acute finding including no evidence of intracranial neoplastic disease. There are chronic small-vessel ischemic changes of the pons, cerebral hemispheric white matter and right cerebellum. --Educated patient to seek immediate evaluate if there are repeat episodes. --No neurological deficits identified per exam today  #Supportive Care -- chemotherapy education complete.  -- port placement not required.  -- zofran  8mg  q8H PRN and compazine  10mg  PO q6H for nausea -- acyclovir  400mg  PO BID for VCZ prophylaxis -- plan to complete dental work before starting Zometa therapy.  -- tylenol  1000 mg q8H PRN for back pain.   No orders of the defined types were placed in this encounter.   Patient expressed understanding of the plan provided.   I have spent a total of 30 minutes minutes of face-to-face and non-face-to-face time, preparing to see the patient, performing a medically appropriate examination, counseling and educating the patient, ordering tests/procedures, documenting clinical information in the electronic health record, and care coordination.   [place] Police PA-C Dept of Hematology and Oncology [facility] at [facility] Phone: [phone]

## 2024-09-10 ENCOUNTER — Telehealth: Payer: Self-pay | Admitting: Cardiology

## 2024-09-10 LAB — KAPPA/LAMBDA LIGHT CHAINS
Kappa free light chain: 58.7 mg/L — ABNORMAL HIGH (ref 3.3–19.4)
Kappa, lambda light chain ratio: 1.22 (ref 0.26–1.65)
Lambda free light chains: 48.2 mg/L — ABNORMAL HIGH (ref 5.7–26.3)

## 2024-09-10 MED ORDER — AMLODIPINE BESYLATE 5 MG PO TABS: 5.0000 mg | ORAL_TABLET | Freq: Every day | ORAL | 1 refills | Status: DC

## 2024-09-10 NOTE — Telephone Encounter (Signed)
*  STAT* If patient is at the pharmacy, call can be transferred to refill team.   1. Which medications need to be refilled? (please list name of each medication and dose if known)   amLODipine  (NORVASC ) 5 MG tablet    2. Which pharmacy/location (including street and city if local pharmacy) is medication to be sent to?  [id number] - , [place] - [address]    [facility] - [place], [place] - [address]     3. Do they need a 30 day or 90 day supply? [facility] and 60 day supply to [facility].    Pt is out of medication

## 2024-09-10 NOTE — Telephone Encounter (Signed)
 Refill sent.

## 2024-09-12 LAB — MULTIPLE MYELOMA PANEL, SERUM
Albumin SerPl Elph-Mcnc: 3.2 g/dL (ref 2.9–4.4)
Albumin/Glob SerPl: 1.1 (ref 0.7–1.7)
Alpha 1: 0.3 g/dL (ref 0.0–0.4)
Alpha2 Glob SerPl Elph-Mcnc: 0.8 g/dL (ref 0.4–1.0)
B-Globulin SerPl Elph-Mcnc: 1 g/dL (ref 0.7–1.3)
Gamma Glob SerPl Elph-Mcnc: 1.1 g/dL (ref 0.4–1.8)
Globulin, Total: 3.2 g/dL (ref 2.2–3.9)
IgA: 380 mg/dL (ref 64–422)
IgG (Immunoglobin G), Serum: 1223 mg/dL (ref 586–1602)
IgM (Immunoglobulin M), Srm: 53 mg/dL (ref 26–217)
Total Protein ELP: 6.4 g/dL (ref 6.0–8.5)

## 2024-09-17 ENCOUNTER — Other Ambulatory Visit: Payer: Self-pay | Admitting: Physician Assistant

## 2024-09-26 ENCOUNTER — Other Ambulatory Visit: Payer: Self-pay | Admitting: *Deleted

## 2024-09-26 DIAGNOSIS — C9000 Multiple myeloma not having achieved remission: Secondary | ICD-10-CM

## 2024-09-26 MED ORDER — LENALIDOMIDE 5 MG PO CAPS: 5.0000 mg | ORAL_CAPSULE | Freq: Every day | ORAL | 0 refills | Status: DC

## 2024-09-30 ENCOUNTER — Encounter (INDEPENDENT_AMBULATORY_CARE_PROVIDER_SITE_OTHER): Admitting: Ophthalmology

## 2024-10-02 ENCOUNTER — Encounter (INDEPENDENT_AMBULATORY_CARE_PROVIDER_SITE_OTHER): Admitting: Ophthalmology

## 2024-10-02 DIAGNOSIS — H43813 Vitreous degeneration, bilateral: Secondary | ICD-10-CM | POA: Diagnosis not present

## 2024-10-02 DIAGNOSIS — H353231 Exudative age-related macular degeneration, bilateral, with active choroidal neovascularization: Secondary | ICD-10-CM | POA: Diagnosis not present

## 2024-10-02 DIAGNOSIS — H35033 Hypertensive retinopathy, bilateral: Secondary | ICD-10-CM | POA: Diagnosis not present

## 2024-10-02 DIAGNOSIS — I10 Essential (primary) hypertension: Secondary | ICD-10-CM | POA: Diagnosis not present

## 2024-10-05 ENCOUNTER — Other Ambulatory Visit: Payer: Self-pay

## 2024-10-05 ENCOUNTER — Inpatient Hospital Stay (HOSPITAL_COMMUNITY)
Admission: EM | Admit: 2024-10-05 | Discharge: 2024-10-08 | DRG: 291 | Disposition: A | Attending: Internal Medicine | Admitting: Internal Medicine

## 2024-10-05 ENCOUNTER — Emergency Department (HOSPITAL_COMMUNITY)

## 2024-10-05 ENCOUNTER — Encounter (HOSPITAL_COMMUNITY): Payer: Self-pay

## 2024-10-05 DIAGNOSIS — R918 Other nonspecific abnormal finding of lung field: Secondary | ICD-10-CM | POA: Diagnosis not present

## 2024-10-05 DIAGNOSIS — Z6832 Body mass index (BMI) 32.0-32.9, adult: Secondary | ICD-10-CM | POA: Diagnosis not present

## 2024-10-05 DIAGNOSIS — I2510 Atherosclerotic heart disease of native coronary artery without angina pectoris: Secondary | ICD-10-CM | POA: Diagnosis present

## 2024-10-05 DIAGNOSIS — K219 Gastro-esophageal reflux disease without esophagitis: Secondary | ICD-10-CM | POA: Diagnosis present

## 2024-10-05 DIAGNOSIS — I480 Paroxysmal atrial fibrillation: Secondary | ICD-10-CM | POA: Diagnosis present

## 2024-10-05 DIAGNOSIS — J189 Pneumonia, unspecified organism: Secondary | ICD-10-CM | POA: Diagnosis present

## 2024-10-05 DIAGNOSIS — Z823 Family history of stroke: Secondary | ICD-10-CM

## 2024-10-05 DIAGNOSIS — C9000 Multiple myeloma not having achieved remission: Secondary | ICD-10-CM | POA: Diagnosis present

## 2024-10-05 DIAGNOSIS — E039 Hypothyroidism, unspecified: Secondary | ICD-10-CM | POA: Diagnosis present

## 2024-10-05 DIAGNOSIS — I509 Heart failure, unspecified: Principal | ICD-10-CM

## 2024-10-05 DIAGNOSIS — Z8673 Personal history of transient ischemic attack (TIA), and cerebral infarction without residual deficits: Secondary | ICD-10-CM

## 2024-10-05 DIAGNOSIS — D630 Anemia in neoplastic disease: Secondary | ICD-10-CM | POA: Diagnosis present

## 2024-10-05 DIAGNOSIS — Z1152 Encounter for screening for COVID-19: Secondary | ICD-10-CM

## 2024-10-05 DIAGNOSIS — N179 Acute kidney failure, unspecified: Secondary | ICD-10-CM | POA: Diagnosis present

## 2024-10-05 DIAGNOSIS — F32A Depression, unspecified: Secondary | ICD-10-CM | POA: Diagnosis present

## 2024-10-05 DIAGNOSIS — E66811 Obesity, class 1: Secondary | ICD-10-CM | POA: Diagnosis present

## 2024-10-05 DIAGNOSIS — I5033 Acute on chronic diastolic (congestive) heart failure: Secondary | ICD-10-CM | POA: Diagnosis present

## 2024-10-05 DIAGNOSIS — N1831 Chronic kidney disease, stage 3a: Secondary | ICD-10-CM | POA: Diagnosis present

## 2024-10-05 DIAGNOSIS — I5031 Acute diastolic (congestive) heart failure: Secondary | ICD-10-CM | POA: Diagnosis not present

## 2024-10-05 DIAGNOSIS — T82857A Stenosis of cardiac prosthetic devices, implants and grafts, initial encounter: Secondary | ICD-10-CM | POA: Diagnosis present

## 2024-10-05 DIAGNOSIS — E876 Hypokalemia: Secondary | ICD-10-CM | POA: Diagnosis present

## 2024-10-05 DIAGNOSIS — J9611 Chronic respiratory failure with hypoxia: Secondary | ICD-10-CM | POA: Diagnosis present

## 2024-10-05 DIAGNOSIS — Z79899 Other long term (current) drug therapy: Secondary | ICD-10-CM | POA: Diagnosis not present

## 2024-10-05 DIAGNOSIS — Z882 Allergy status to sulfonamides status: Secondary | ICD-10-CM

## 2024-10-05 DIAGNOSIS — N189 Chronic kidney disease, unspecified: Secondary | ICD-10-CM | POA: Diagnosis not present

## 2024-10-05 DIAGNOSIS — Z96653 Presence of artificial knee joint, bilateral: Secondary | ICD-10-CM | POA: Diagnosis present

## 2024-10-05 DIAGNOSIS — H3530 Unspecified macular degeneration: Secondary | ICD-10-CM | POA: Diagnosis present

## 2024-10-05 DIAGNOSIS — I130 Hypertensive heart and chronic kidney disease with heart failure and stage 1 through stage 4 chronic kidney disease, or unspecified chronic kidney disease: Secondary | ICD-10-CM | POA: Diagnosis present

## 2024-10-05 DIAGNOSIS — Z7901 Long term (current) use of anticoagulants: Secondary | ICD-10-CM

## 2024-10-05 DIAGNOSIS — Z7984 Long term (current) use of oral hypoglycemic drugs: Secondary | ICD-10-CM

## 2024-10-05 DIAGNOSIS — I4510 Unspecified right bundle-branch block: Secondary | ICD-10-CM | POA: Diagnosis present

## 2024-10-05 DIAGNOSIS — E785 Hyperlipidemia, unspecified: Secondary | ICD-10-CM | POA: Diagnosis present

## 2024-10-05 DIAGNOSIS — Z953 Presence of xenogenic heart valve: Secondary | ICD-10-CM

## 2024-10-05 DIAGNOSIS — Z87442 Personal history of urinary calculi: Secondary | ICD-10-CM

## 2024-10-05 DIAGNOSIS — Y831 Surgical operation with implant of artificial internal device as the cause of abnormal reaction of the patient, or of later complication, without mention of misadventure at the time of the procedure: Secondary | ICD-10-CM | POA: Diagnosis present

## 2024-10-05 DIAGNOSIS — Z8249 Family history of ischemic heart disease and other diseases of the circulatory system: Secondary | ICD-10-CM

## 2024-10-05 DIAGNOSIS — Z79890 Hormone replacement therapy (postmenopausal): Secondary | ICD-10-CM

## 2024-10-05 DIAGNOSIS — Z880 Allergy status to penicillin: Secondary | ICD-10-CM

## 2024-10-05 DIAGNOSIS — Z833 Family history of diabetes mellitus: Secondary | ICD-10-CM

## 2024-10-05 DIAGNOSIS — Z90710 Acquired absence of both cervix and uterus: Secondary | ICD-10-CM

## 2024-10-05 DIAGNOSIS — Z888 Allergy status to other drugs, medicaments and biological substances status: Secondary | ICD-10-CM

## 2024-10-05 DIAGNOSIS — Z853 Personal history of malignant neoplasm of breast: Secondary | ICD-10-CM

## 2024-10-05 LAB — CBC WITH DIFFERENTIAL/PLATELET
Basophils Absolute: 0.1 K/uL (ref 0.0–0.1)
Basophils Relative: 2 %
Eosinophils Absolute: 0.1 K/uL (ref 0.0–0.5)
Eosinophils Relative: 2 %
HCT: 31.5 % — ABNORMAL LOW (ref 36.0–46.0)
Hemoglobin: 9.7 g/dL — ABNORMAL LOW (ref 12.0–15.0)
Lymphocytes Relative: 28 %
Lymphs Abs: 1.5 K/uL (ref 0.7–4.0)
MCH: 28.5 pg (ref 26.0–34.0)
MCHC: 30.8 g/dL (ref 30.0–36.0)
MCV: 92.6 fL (ref 80.0–100.0)
Monocytes Absolute: 0.3 K/uL (ref 0.1–1.0)
Monocytes Relative: 5 %
Neutro Abs: 3.3 K/uL (ref 1.7–7.7)
Neutrophils Relative %: 63 %
Platelets: 144 K/uL — ABNORMAL LOW (ref 150–400)
RBC: 3.4 MIL/uL — ABNORMAL LOW (ref 3.87–5.11)
RDW: 17.7 % — ABNORMAL HIGH (ref 11.5–15.5)
WBC: 5.2 K/uL (ref 4.0–10.5)
nRBC: 0 % (ref 0.0–0.2)

## 2024-10-05 LAB — COMPREHENSIVE METABOLIC PANEL WITH GFR
ALT: 10 U/L (ref 0–44)
AST: 17 U/L (ref 15–41)
Albumin: 2.7 g/dL — ABNORMAL LOW (ref 3.5–5.0)
Alkaline Phosphatase: 69 U/L (ref 38–126)
Anion gap: 11 (ref 5–15)
BUN: 20 mg/dL (ref 8–23)
CO2: 21 mmol/L — ABNORMAL LOW (ref 22–32)
Calcium: 8.1 mg/dL — ABNORMAL LOW (ref 8.9–10.3)
Chloride: 105 mmol/L (ref 98–111)
Creatinine, Ser: 1.16 mg/dL — ABNORMAL HIGH (ref 0.44–1.00)
GFR, Estimated: 45 mL/min — ABNORMAL LOW (ref >60)
Glucose, Bld: 105 mg/dL — ABNORMAL HIGH (ref 70–99)
Potassium: 4.2 mmol/L (ref 3.5–5.1)
Sodium: 137 mmol/L (ref 135–145)
Total Bilirubin: 1.3 mg/dL — ABNORMAL HIGH (ref 0.0–1.2)
Total Protein: 6 g/dL — ABNORMAL LOW (ref 6.5–8.1)

## 2024-10-05 LAB — I-STAT VENOUS BLOOD GAS, ED
Acid-Base Excess: 2 mmol/L (ref 0.0–2.0)
Bicarbonate: 23.1 mmol/L (ref 20.0–28.0)
Calcium, Ion: 0.98 mmol/L — ABNORMAL LOW (ref 1.15–1.40)
HCT: 26 % — ABNORMAL LOW (ref 36.0–46.0)
Hemoglobin: 8.8 g/dL — ABNORMAL LOW (ref 12.0–15.0)
O2 Saturation: 91 %
Potassium: 3.4 mmol/L — ABNORMAL LOW (ref 3.5–5.1)
Sodium: 138 mmol/L (ref 135–145)
TCO2: 24 mmol/L (ref 22–32)
pCO2, Ven: 24.6 mmHg — ABNORMAL LOW (ref 44–60)
pH, Ven: 7.581 — ABNORMAL HIGH (ref 7.25–7.43)
pO2, Ven: 50 mmHg — ABNORMAL HIGH (ref 32–45)

## 2024-10-05 LAB — BRAIN NATRIURETIC PEPTIDE: B Natriuretic Peptide: 608.5 pg/mL — ABNORMAL HIGH (ref 0.0–100.0)

## 2024-10-05 LAB — RESP PANEL BY RT-PCR (RSV, FLU A&B, COVID)  RVPGX2
Influenza A by PCR: NEGATIVE
Influenza B by PCR: NEGATIVE
Resp Syncytial Virus by PCR: NEGATIVE
SARS Coronavirus 2 by RT PCR: NEGATIVE

## 2024-10-05 LAB — TROPONIN I (HIGH SENSITIVITY): Troponin I (High Sensitivity): 23 ng/L — ABNORMAL HIGH (ref <18)

## 2024-10-05 MED ORDER — POTASSIUM CHLORIDE CRYS ER 20 MEQ PO TBCR: 20.0000 meq | EXTENDED_RELEASE_TABLET | Freq: Two times a day (BID) | ORAL | Status: DC

## 2024-10-05 MED ORDER — LEVOTHYROXINE SODIUM 100 MCG PO TABS
100.0000 ug | ORAL_TABLET | Freq: Every day | ORAL | Status: DC
  Administered 2024-10-05 – 2024-10-08 (×4): 100 ug via ORAL
  Filled 2024-10-05 (×4): qty 1

## 2024-10-05 MED ORDER — SODIUM CHLORIDE 0.9 % IV SOLN
500.0000 mg | Freq: Once | INTRAVENOUS | Status: DC
  Filled 2024-10-05: qty 5

## 2024-10-05 MED ORDER — FUROSEMIDE 10 MG/ML IJ SOLN
60.0000 mg | Freq: Once | INTRAMUSCULAR | Status: AC
  Administered 2024-10-05: 60 mg via INTRAVENOUS
  Filled 2024-10-05: qty 6

## 2024-10-05 MED ORDER — AMIODARONE HCL 100 MG PO TABS
50.0000 mg | ORAL_TABLET | Freq: Every morning | ORAL | Status: DC
  Administered 2024-10-06: 12:00:00 50 mg via ORAL
  Filled 2024-10-05 (×2): qty 1

## 2024-10-05 MED ORDER — PROCHLORPERAZINE EDISYLATE 10 MG/2ML IJ SOLN: 5.0000 mg | Freq: Four times a day (QID) | INTRAMUSCULAR | Status: DC | PRN

## 2024-10-05 MED ORDER — POLYETHYLENE GLYCOL 3350 17 G PO PACK: 17.0000 g | PACK | Freq: Every day | ORAL | Status: DC | PRN

## 2024-10-05 MED ORDER — OXYCODONE HCL 5 MG PO TABS
5.0000 mg | ORAL_TABLET | Freq: Two times a day (BID) | ORAL | Status: DC | PRN
  Administered 2024-10-07: 10:00:00 5 mg via ORAL
  Filled 2024-10-05: qty 1

## 2024-10-05 MED ORDER — VITAMIN D 25 MCG (1000 UNIT) PO TABS
1000.0000 [IU] | ORAL_TABLET | Freq: Every morning | ORAL | Status: DC
  Administered 2024-10-06 – 2024-10-08 (×3): 1000 [IU] via ORAL
  Filled 2024-10-05 (×3): qty 1

## 2024-10-05 MED ORDER — POTASSIUM CHLORIDE CRYS ER 20 MEQ PO TBCR
20.0000 meq | EXTENDED_RELEASE_TABLET | Freq: Three times a day (TID) | ORAL | Status: AC
  Administered 2024-10-05 (×3): 20 meq via ORAL
  Filled 2024-10-05 (×3): qty 1

## 2024-10-05 MED ORDER — ACETAMINOPHEN 500 MG PO TABS: 500.0000 mg | ORAL_TABLET | Freq: Four times a day (QID) | ORAL | Status: DC | PRN

## 2024-10-05 MED ORDER — METOPROLOL SUCCINATE ER 50 MG PO TB24
50.0000 mg | ORAL_TABLET | Freq: Every day | ORAL | Status: DC
  Administered 2024-10-05 – 2024-10-08 (×4): 50 mg via ORAL
  Filled 2024-10-05 (×2): qty 2
  Filled 2024-10-05 (×2): qty 1

## 2024-10-05 MED ORDER — DOCUSATE SODIUM 100 MG PO CAPS
100.0000 mg | ORAL_CAPSULE | Freq: Every day | ORAL | Status: DC
  Administered 2024-10-05 – 2024-10-08 (×4): 100 mg via ORAL
  Filled 2024-10-05 (×4): qty 1

## 2024-10-05 MED ORDER — MELATONIN 5 MG PO TABS
5.0000 mg | ORAL_TABLET | Freq: Every evening | ORAL | Status: DC | PRN
  Administered 2024-10-06: 01:00:00 5 mg via ORAL
  Filled 2024-10-05: qty 1

## 2024-10-05 MED ORDER — SODIUM CHLORIDE 0.9 % IV SOLN
1.0000 g | Freq: Once | INTRAVENOUS | Status: DC
  Filled 2024-10-05: qty 10

## 2024-10-05 MED ORDER — SODIUM CHLORIDE 0.9 % IV SOLN
500.0000 mg | Freq: Once | INTRAVENOUS | Status: AC
  Administered 2024-10-05: 500 mg via INTRAVENOUS
  Filled 2024-10-05: qty 5

## 2024-10-05 MED ORDER — AMLODIPINE BESYLATE 5 MG PO TABS
5.0000 mg | ORAL_TABLET | Freq: Every day | ORAL | Status: DC
  Administered 2024-10-05 – 2024-10-07 (×3): 5 mg via ORAL
  Filled 2024-10-05 (×3): qty 1

## 2024-10-05 MED ORDER — ADULT MULTIVITAMIN W/MINERALS CH
1.0000 | ORAL_TABLET | Freq: Every day | ORAL | Status: DC
  Administered 2024-10-05 – 2024-10-08 (×4): 1 via ORAL
  Filled 2024-10-05 (×5): qty 1

## 2024-10-05 MED ORDER — FUROSEMIDE 10 MG/ML IJ SOLN
60.0000 mg | Freq: Once | INTRAMUSCULAR | Status: DC
  Filled 2024-10-05: qty 6

## 2024-10-05 MED ORDER — LENALIDOMIDE 5 MG PO CAPS: 5.0000 mg | ORAL_CAPSULE | Freq: Every day | ORAL | Status: DC

## 2024-10-05 MED ORDER — ACYCLOVIR 400 MG PO TABS
400.0000 mg | ORAL_TABLET | Freq: Two times a day (BID) | ORAL | Status: DC
  Administered 2024-10-05 – 2024-10-08 (×6): 400 mg via ORAL
  Filled 2024-10-05 (×9): qty 1

## 2024-10-05 MED ORDER — VITAMIN B-12 1000 MCG PO TABS
1000.0000 ug | ORAL_TABLET | Freq: Every morning | ORAL | Status: DC
  Administered 2024-10-06 – 2024-10-08 (×3): 1000 ug via ORAL
  Filled 2024-10-05 (×3): qty 1

## 2024-10-05 MED ORDER — FUROSEMIDE 10 MG/ML IJ SOLN
20.0000 mg | Freq: Two times a day (BID) | INTRAMUSCULAR | Status: DC
  Administered 2024-10-05: 20 mg via INTRAVENOUS
  Filled 2024-10-05 (×2): qty 2

## 2024-10-05 MED ORDER — PANTOPRAZOLE SODIUM 40 MG PO TBEC
40.0000 mg | DELAYED_RELEASE_TABLET | Freq: Every day | ORAL | Status: DC
  Administered 2024-10-05 – 2024-10-08 (×3): 40 mg via ORAL
  Filled 2024-10-05 (×4): qty 1

## 2024-10-05 MED ORDER — IPRATROPIUM-ALBUTEROL 0.5-2.5 (3) MG/3ML IN SOLN: 3.0000 mL | RESPIRATORY_TRACT | Status: DC | PRN

## 2024-10-05 MED ORDER — IOHEXOL 350 MG/ML SOLN
75.0000 mL | Freq: Once | INTRAVENOUS | Status: AC | PRN
  Administered 2024-10-05: 75 mL via INTRAVENOUS

## 2024-10-05 MED ORDER — TORSEMIDE 20 MG PO TABS
20.0000 mg | ORAL_TABLET | Freq: Every day | ORAL | Status: DC
  Administered 2024-10-05 – 2024-10-06 (×2): 20 mg via ORAL
  Filled 2024-10-05 (×2): qty 1

## 2024-10-05 MED ORDER — APIXABAN 5 MG PO TABS
5.0000 mg | ORAL_TABLET | Freq: Two times a day (BID) | ORAL | Status: DC
  Filled 2024-10-05: qty 1

## 2024-10-05 MED ORDER — SODIUM CHLORIDE 0.9 % IV SOLN
1.0000 g | Freq: Once | INTRAVENOUS | Status: AC
  Administered 2024-10-05: 1 g via INTRAVENOUS
  Filled 2024-10-05: qty 10

## 2024-10-05 MED ORDER — PROSIGHT PO TABS
1.0000 | ORAL_TABLET | Freq: Two times a day (BID) | ORAL | Status: DC
  Filled 2024-10-05 (×2): qty 1

## 2024-10-05 MED ORDER — ROSUVASTATIN CALCIUM 20 MG PO TABS
20.0000 mg | ORAL_TABLET | Freq: Every day | ORAL | Status: DC
  Administered 2024-10-05 – 2024-10-08 (×4): 20 mg via ORAL
  Filled 2024-10-05 (×4): qty 1

## 2024-10-05 MED ORDER — APIXABAN 5 MG PO TABS
5.0000 mg | ORAL_TABLET | Freq: Two times a day (BID) | ORAL | Status: DC
  Administered 2024-10-05 – 2024-10-08 (×7): 5 mg via ORAL
  Filled 2024-10-05 (×7): qty 1

## 2024-10-05 NOTE — ED Triage Notes (Signed)
 Pt bib ems/[place] coming from alf for sob worsen today 148/64 HR 74 O2 96 on 2 lt [place]

## 2024-10-05 NOTE — Progress Notes (Addendum)
 Same day note  Zoe [name] is a 89 y.o. female with past medical history significant for paroxysmal A-fib on Eliquis , chronic HFpEF, hypothyroidism, hypertension, hyperlipidemia, GERD, multiple myeloma on Revlimid , presented to hospital with gradually progressive shortness of breath for 5 days with bilateral lower extremity edema, subjective fever cough and congestion.  In the ED, patient was afebrile but tachypneic and needed supplemental oxygen..  Chest x-ray showed right upper lobe airspace opacity suspicious for pneumonia or aspiration with chronic interstitial findings.  Had bilateral trace pleural effusion as well.  Labs were notable for BNP more than 600.  In the ED patient was given IV Lasix , empiric IV antibiotics with, Rocephin  and azithromycin , for community-acquired pneumonia and was admitted hospital for further evaluation and treatment.     Patient seen and examined at bedside.  Patient was admitted to the hospital for shortness of breath and lower extremity swelling  At the time of my evaluation, patient complains of feeling little better with breathing.  Still has shortness of breath.  Feels tired and weak.  Physical examination reveals elderly female, alert awake and oriented, on high flow nasal cannula oxygen, bilateral lower extremity trace edema.  Decreased breath sounds bilaterally.  Obese build.  Laboratory data and imaging was reviewed  Assessment and Plan.  Acute on chronic HFpEF Patient does have a history of severe AS status post TAVR in the past.  BNP more than 600.  Was volume overloaded on presentation.  Continue strict intake output charting and daily weights check 2D echocardiogram.  Patient received 60 mg of IV Lasix  yesterday and is on 20 mg twice daily at this time.  Seen by Dr. Margaretann, cardiology in the past.  Has been diuresing well.   Paroxysmal A-fib on Eliquis  Rate controlled at this time.  Continue Eliquis .   Chronic normocytic anemia likely  secondary to multiple myeloma Last hemoglobin of 9.7 from 8.8.  Follows up with oncology as outpatient.   Hypokalemia Initial potassium was 3.4.  Has been fairly.  Last potassium after replacement at 4.2.  Will continue to monitor.   Multiple myeloma Generalized weakness Ambulates with the help of walker at home.  Will get PT OT evaluation.     Hyperlipidemia Continue statin   Hypothyroidism Continue Synthroid     I tried to reach out to the patient's daughter [name] and son Zoe [name] on the phone but was unable to reach out to them today.  Addendum: Nursing staff reported that the patient was infiltrated so no IV option for IV Lasix  so we will change to torsemide  40 x 1 today.   No Charge  Signed,  Vernal Anselm Alstrom, MD [facility]

## 2024-10-05 NOTE — ED Notes (Signed)
 Upon walking in the room to give PT her 1800 lasix , This Rn noticed swelling, blood under IV bandage and when this RN flushed the IV it would not flush.Bruising was also noted in the area, this RN removed IV, cleaned area, elevated PT's arm, contacted pharmacy and physician.

## 2024-10-05 NOTE — ED Provider Notes (Signed)
 [place] [facility] AT [facility] Provider Note   CSN: [id number] Arrival date & time: 10/05/24  0228     History Chief Complaint  Patient presents with   Shortness of Breath    HPI Zoe  B [name] is a 89 y.o. female presenting for fever and cough and congestion. She states that she has had a week of URI symptoms and tonight had much more acute symptoms.  Patient has HFpEF.  Patient's recorded medical, surgical, social, medication list and allergies were reviewed in the Snapshot window as part of the initial history.   Review of Systems   Review of Systems  Constitutional:  Negative for chills and fever.  HENT:  Positive for congestion and sore throat. Negative for ear pain.   Eyes:  Negative for pain and visual disturbance.  Respiratory:  Positive for cough and shortness of breath. Negative for choking.   Cardiovascular:  Negative for chest pain and palpitations.  Gastrointestinal:  Negative for abdominal pain and vomiting.  Genitourinary:  Negative for dysuria and hematuria.  Musculoskeletal:  Negative for arthralgias and back pain.  Skin:  Negative for color change and rash.  Neurological:  Negative for seizures and syncope.  All other systems reviewed and are negative.   Physical Exam Updated Vital Signs BP (!) 158/63 (BP Location: Left Arm)   Pulse 80   Temp 98.2 F (36.8 C) (Oral)   Resp (!) 21   Ht 5' 4 (1.626 m)   Wt 85 kg   SpO2 96%   BMI 32.17 kg/m  Physical Exam Vitals and nursing note reviewed.  Constitutional:      General: She is not in acute distress.    Appearance: She is well-developed.  HENT:     Head: Normocephalic and atraumatic.  Eyes:     Conjunctiva/sclera: Conjunctivae normal.  Cardiovascular:     Rate and Rhythm: Normal rate and regular rhythm.     Heart sounds: No murmur heard. Pulmonary:     Effort: Pulmonary effort is normal. Tachypnea present. No respiratory distress.     Breath sounds: Rhonchi  present.  Abdominal:     Palpations: Abdomen is soft.     Tenderness: There is no abdominal tenderness.  Musculoskeletal:        General: No swelling.     Cervical back: Neck supple.     Right lower leg: Edema present.     Left lower leg: Edema present.  Skin:    General: Skin is warm and dry.     Capillary Refill: Capillary refill takes less than 2 seconds.  Neurological:     Mental Status: She is alert.  Psychiatric:        Mood and Affect: Mood normal.      ED Course/ Medical Decision Making/ A&P    Procedures Procedures   Medications Ordered in ED Medications  furosemide  (LASIX ) injection 60 mg (has no administration in time range)  cefTRIAXone  (ROCEPHIN ) 1 g in sodium chloride  0.9 % 100 mL IVPB (has no administration in time range)  azithromycin  (ZITHROMAX ) 500 mg in sodium chloride  0.9 % 250 mL IVPB (has no administration in time range)  iohexol  (OMNIPAQUE ) 350 MG/ML injection 75 mL (75 mLs Intravenous Contrast Given 10/05/24 0421)   Medical Decision Making:   [name] is a 89 y.o. female with a history of CHF, who presented to the ED today with acute on chronic SOB. They are endorsing worsening of their baseline dyspnea over the past 48  hours. Their baseline is a 0L O2 requirement. At their baseline they are able to get around the house and they are not able to at this time.   On my initial exam, the pt was SOB and tachypneic. LE edema present.  They are endorsing cough/fever/sputum production.    Reviewed and confirmed nursing documentation for past medical history, family history, social history.    Initial Assessment:   With the patient's presentation of SOB in the above setting, most likely diagnosis is CHF Exacerbation. Other diagnoses were considered including (but not limited to) CAP, PE, ACS, viral infection, PTX. These are considered less likely due to history of present illness and physical exam findings.   This is most consistent with an acute  life/limb threatening illness complicated by underlying chronic conditions.  Initial Plan:  Empiric treatment of patient's symptoms with oxygen administration. EKG/Troponin testing/BNP testing to evaluate for cardiac pathology. Evaluation for infectious versus intrathoracic abnormality with chest x-ray  Evaluation for volume overload with BNP  Screening labs including CBC and Metabolic panel to evaluate for infectious or metabolic etiology of disease.  Patient's Wells score is moderate and patient does warrant further objective evaluation for PE based on consistency of presentation of alternative diagnosis.  Objective evaluation as below reviewed   Initial Study Results:   Laboratory  All laboratory results reviewed without evidence of clinically relevant pathology.    EKG EKG was reviewed independently. Rate, rhythm, axis, intervals all examined and without medically relevant abnormality. ST segments without concerns for elevations.    Radiology:  All images reviewed independently. Agree with radiology report at this time.   CT Angio Chest Pulmonary Embolism (PE) W or WO Contrast Result Date: 10/05/2024 EXAM: CTA of the Chest with contrast for PE 10/05/2024 04:21:04 AM TECHNIQUE: CTA of the chest was performed without and with the administration of intravenous contrast. Multiplanar reformatted images are provided for review. MIP images are provided for review. Automated exposure control, iterative reconstruction, and/or weight based adjustment of the mA/kV was utilized to reduce the radiation dose to as low as reasonably achievable. CONTRAST: 75 mL iohexol  (OMNIPAQUE ) 350 MG/ML injection. COMPARISON: CTA chest abdomen and pelvis 08/15/2008, portable chest x ray earlier today, chest CT 01/11/2024. CLINICAL HISTORY: 89 year old female Pulmonary embolism (PE) suspected, high probability. FINDINGS: PULMONARY ARTERIES: Suboptimal but adequate pulmonary artery contrast timing. No pulmonary artery  filling defect identified. Distal branches in both the upper lobes and at the lung bases are obscured by mild respiratory motion. Main pulmonary artery is normal in caliber. MEDIASTINUM: Left chest cardiac pacemaker device now in place. Prosthetic aortic valve. Calcified coronary artery atherosclerosis (series 7 image 146) and extensive calcified atherosclerosis of the visible aorta. No pericardial effusion. LYMPH NODES: Reactive appearing mediastinal lymph nodes not significantly changed from 2019. No hilar or axillary lymphadenopathy. LUNGS AND PLEURA: Small bilateral layering pleural effusions with simple fluid density suggesting transudates. Extensive nodular bilateral lung opacity right greater than left in the upper lobes, superior segments of the lower lobes, middle lobe less affected. Confluent bilateral lower lobe lung opacity appears to reflect a combination of compressive atelectasis and airspace disease. Bilateral lung opacity most compatible with extensive infection, pneumonia. Early consolidation in the lower lobes. Pulmonary edema considered but unlikely. No pneumothorax. UPPER ABDOMEN: Chronic benign anterior right hepatic lobe 7 cm hemangioma, stable in size and configuration since 2019 when it demonstrated typical peripheral puddling enhancement pattern. Chronic mild elevation of the right hemidiaphragm is stable. Otherwise negative visible mostly non-contrast  liver, spleen, pancreas, adrenal glands and bowel in the upper abdomen. SOFT TISSUES AND BONES: Diffuse osteopenia. Chronic thoracic compression fractures, some previously augmented and stable. Healing mid sternal fracture since the previous CT. Healing right 7th and 8th rib fractures. No acute osseous abnormality. No acute soft tissue abnormality. IMPRESSION: 1. No pulmonary embolism identified; suboptimal evaluation due to contrast timing and mild respiratory motion. 2. Widespread bilateral lung opacity most compatible with infection /  developing multilobar pneumonia. Small bilateral layering pleural effusions and early consolidation in the lower lobes. Reactive mediastinal lymph nodes. Pulmonary edema was also considered but felt unlikely. 3. Chronic osteopenia and compression fractures. Healing sternal and right rib fractures since March. Electronically signed by: Helayne Hurst MD 10/05/2024 04:40 AM EST RP Workstation: [id number]   DG Chest Portable 1 View Result Date: 10/05/2024 EXAM: 1 VIEW(S) XRAY OF THE CHEST 10/05/2024 03:09:37 AM COMPARISON: 07/24/2024 CLINICAL HISTORY: sob FINDINGS: LINES, TUBES AND DEVICES: Left chest pacemaker with leads overlying right atrium and right ventricle. Aortic valve replacement noted. LUNGS AND PLEURA: Right upper lobe airspace opacity. Chronic coarsened interstitial markings with mild edema. Bilateral trace pleural effusions. No pneumothorax. HEART AND MEDIASTINUM: Cardiomegaly. Atherosclerotic plaque noted. Left chest pacemaker with leads overlying right atrium and right ventricle. Aortic valve replacement noted. BONES AND SOFT TISSUES: Multilevel mid to lower thoracic vertebral augmentation. IMPRESSION: 1. Right upper lobe airspace opacity, suspicious for pneumonia or aspiration. 2. Chronic interstitial lung disease with mild interstitial edema. 3. Bilateral trace pleural effusions. Electronically signed by: Oneil Devonshire MD 10/05/2024 03:20 AM EST RP Workstation: [id number]    Final Assessment and Plan:   She was initiated on diureses above typical dose. Reassessed after initiation of medical therapies, patient is grossly improved and no longer in acute distress.   Also possible developing PNA started on ABX Given the advanced nature of the patient's presentation, patient will require advanced care and admission was arranged.      Clinical Impression:  1. Acute congestive heart failure, unspecified heart failure type (HCC)   2. Community acquired pneumonia of left lung, unspecified part of  lung      Data Unavailable   Final Clinical Impression(s) / ED Diagnoses Final diagnoses:  Acute congestive heart failure, unspecified heart failure type (HCC)  Community acquired pneumonia of left lung, unspecified part of lung    Rx / DC Orders ED Discharge Orders     None         Jerral Meth, MD 10/05/24 [phone]

## 2024-10-05 NOTE — Progress Notes (Signed)
° °  Brief Progress Note   _____________________________________________________________________________________________________________  Patient Name: Zoe [name] Patient DOB: 05/20/1934 Date: @TODAY @      Data: Reviewed vital signs, labs, and clinical notes.    Action: No action required at this time.    Response:  No tele beds available at this time.  _____________________________________________________________________________________________________________  [facility] RN Expeditor Serrina [place] [address] Ceil Roderick Please contact us  directly via secure chat (search for [facility]) or by calling us  at [phone] [facility]).

## 2024-10-05 NOTE — Hospital Course (Signed)
 Same day note Zoe [name] is a 89 y.o. female with past medical history significant for paroxysmal A-fib on Eliquis , chronic HFpEF, hypothyroidism, hypertension, hyperlipidemia, GERD, multiple myeloma on Revlimid , presented to hospital with gradually progressive shortness of breath for 5 days with bilateral lower extremity edema, subjective fever cough and congestion.  In the ED, patient was afebrile but tachypneic and needed supplemental oxygen..  Chest x-ray showed right upper lobe airspace opacity suspicious for pneumonia or aspiration with chronic interstitial findings.  Had bilateral trace pleural effusion as well.  Labs were notable for BNP more than 600.  In the ED patient was given IV Lasix , empiric IV antibiotics with, Rocephin  and azithromycin , for community-acquired pneumonia and was admitted hospital for further evaluation and treatment.     Patient seen and examined at bedside.  Patient was admitted to the hospital for shortness of breath and lower extremity swelling  At the time of my evaluation, patient complains of   Physical examination reveals  Laboratory data and imaging was reviewed  Assessment and Plan.  Acute on chronic HFpEF Patient does have a history of severe AS status post TAVR in the past.  BNP more than 600.  Was volume overloaded on presentation.  Continue strict intake output charting and daily weights check 2D echocardiogram.  Patient received 60 mg of IV Lasix  yesterday and is on 20 mg twice daily at this time.  Seen by Dr. Margaretann, cardiology in the past.   Paroxysmal A-fib on Eliquis  Rate controlled at this time.  Continue Eliquis .   Chronic normocytic anemia likely secondary to multiple myeloma Last hemoglobin of 9.7 from 8.8.  Follows up with oncology as outpatient.   Hypokalemia Initial potassium was 3.4.  Has been fairly.  Last potassium after replacement at 4.2.  Will continue to monitor.   Multiple myeloma Generalized weakness Ambulates  with the help of walker at home.  Will get PT OT evaluation.     Hyperlipidemia Continue statin   Hypothyroidism Continue Synthroid     No Charge  Signed,  Vernal Anselm Alstrom, MD [facility]

## 2024-10-05 NOTE — H&P (Addendum)
 History and Physical  Zoe [name]  BRIER FIREBAUGH FMW:[id number] DOB: 12-03-1933 DOA: 10/05/2024  Referring physician: Dr. Jerral, EDP  PCP: Gerome Brunet, DO  Outpatient Specialists: Cardiology. Patient coming from: Independent living facility.  Chief Complaint: Shortness of breath and lower extremity swelling  HPI: Zoe  B [name] is a 89 y.o. female with medical history significant for paroxysmal A-fib on Eliquis , chronic HFpEF, hypothyroidism, hypertension, hyperlipidemia, GERD, multiple myeloma on Revlimid , who presents to the ER with complaints of gradually worsening shortness of breath for the past 5 days.  Associated with bilateral lower extremity edema.  Also endorses subjective fevers cough and congestion.  Symptoms worsened last night.  In the ER, tachypneic, volume overload on exam.  Chest x-ray revealing right upper lobe airspace opacity suspicious for pneumonia or aspiration.  Chronic interstitial lung disease with mild interstitial edema.  Bilateral trace pleural effusions.  Lab studies notable for elevated BNP greater than 600.  Due to concern for acute on chronic HFpEF, the patient was started on IV Lasix  in the ER.  Also started empiric IV antibiotics, Rocephin  and azithromycin , for community-acquired pneumonia.  Admitted by [facility], hospitalist service.  ED Course: Temperature 98.2.  Blood pressure 150/63, pulse 71, respiratory rate 23, O2 saturation 96% on 2 L.  Review of Systems: Review of systems as noted in the HPI. All other systems reviewed and are negative.   Past Medical History:  Diagnosis Date   Arthritis    some - per patient   Atrial fibrillation (HCC)    Breast cancer (HCC)    breast cancer / left    Cataract    bilat    GERD (gastroesophageal reflux disease)    History of kidney stones    Hyperlipidemia    Hypertension    Hypothyroidism    Macular degeneration    Left   S/P TAVR (transcatheter aortic valve replacement) 09/03/2018   23 mm Edwards  Sapien 3 transcatheter heart valve placed via percutaneous right transfemoral approach    Severe aortic stenosis    Stress incontinence    Thyroid  disease    Tinnitus    Past Surgical History:  Procedure Laterality Date   ABDOMINAL HYSTERECTOMY  1970's   BACK SURGERY     BREAST LUMPECTOMY  12/1998   lumpectomy   CARDIAC CATHETERIZATION     CARDIOVERSION N/A 04/18/2023   Procedure: CARDIOVERSION;  Surgeon: Michele Richardson, DO;  Location: [facility] INVASIVE CV [facility];  Service: Cardiovascular;  Laterality: N/A;   CARDIOVERSION N/A 04/20/2023   Procedure: CARDIOVERSION;  Surgeon: Ladona Heinz, MD;  Location: [facility] INV[facility] CV [facility];  Service: Cardiovascular;  Laterality: N/A;   EYE SURGERY     cataract surgery bilat    INTRAOPERATIVE TRANSTHORACIC ECHOCARDIOGRAM N/A 09/03/2018   Procedure: INTRAOPERATIVE TRANSTHORACIC ECHOCARDIOGRAM;  Surgeon: Verlin Lonni BIRCH, MD;  Location: [facility] OR;  Service: Open Heart Surgery;  Laterality: N/A;   KYPHOPLASTY N/A 09/07/2022   Procedure: THORACIC EIGHT KYPHOPLASTY;  Surgeon: Burnetta Aures, MD;  Location: [facility] OR;  Service: Orthopedics;  Laterality: N/A;  1 hr Local with IV [facility] 3 C-Bed   LITHOTRIPSY     PACEMAKER IMPLANT N/A 01/09/2024   Procedure: PACEMAKER IMPLANT;  Surgeon: Waddell Danelle ORN, MD;  Location: [facility] CV [facility];  Service: Cardiovascular;  Laterality: N/A;   Right total knee     2018 Dr. Ernie   RIGHT/LEFT HEART CATH AND CORONARY ANGIOGRAPHY N/A 08/06/2018   Procedure: RIGHT/LEFT HEART CATH AND CORONARY ANGIOGRAPHY;  Surgeon: Ladona Heinz, MD;  Location: [facility] INVASIVE CV  LAB;  Service: Cardiovascular;  Laterality: N/A;   THYROIDECTOMY, PARTIAL  1975   TONSILLECTOMY     as a child - patient not sure of exact date   TOTAL KNEE ARTHROPLASTY Left 03/13/2016   Procedure: TOTAL KNEE ARTHROPLASTY;  Surgeon: Donnice Car, MD;  Location: [facility] ORS;  Service: Orthopedics;  Laterality: Left;   TOTAL KNEE ARTHROPLASTY Right 06/18/2017   Procedure: RIGHT TOTAL  KNEE ARTHROPLASTY;  Surgeon: Car Donnice, MD;  Location: [facility] ORS;  Service: Orthopedics;  Laterality: Right;   TRANSCATHETER AORTIC VALVE REPLACEMENT, TRANSFEMORAL N/A 09/03/2018   Procedure: TRANSCATHETER AORTIC VALVE REPLACEMENT, TRANSFEMORAL;  Surgeon: Verlin Lonni BIRCH, MD;  Location: [facility] OR;  Service: Open Heart Surgery;  Laterality: N/A;    Social History:  reports that she has never smoked. She has never used smokeless tobacco. She reports that she does not drink alcohol  and does not use drugs.   Allergies[1]  Family History  Problem Relation Age of Onset   Diabetes Mother    Stroke Mother        Carotid artery disease   Heart disease Father        CAD   Coronary artery disease Father    Diabetes Sister       Prior to Admission medications  Medication Sig Start Date End Date Taking? Authorizing Provider  acetaminophen  (TYLENOL ) 500 MG tablet Take 1,000 mg by mouth every 6 (six) hours as needed.    [provider]  albuterol  (VENTOLIN  HFA) 108 (90 Base) MCG/ACT inhaler Inhale 2 puffs into the lungs every 6 (six) hours as needed for wheezing or shortness of breath.    [provider]  amiodarone  (PACERONE ) 100 MG tablet Take 100 mg by mouth daily.    [provider]  amLODipine  (NORVASC ) 5 MG tablet TAKE 1 TABLET BY MOUTH AT BEDTIME 09/23/24   Ladona Heinz, MD  apixaban  (ELIQUIS ) 5 MG TABS tablet Take 1 tablet (5 mg total) by mouth 2 (two) times daily. 11/15/23   Ladona Heinz, MD  Artificial Tear Solution (SOOTHE XP) SOLN Place 1 drop into both eyes every evening.    [provider]  Cholecalciferol  (VITAMIN D3) 50 MCG (2000 UT) capsule Take 1 capsule (2,000 Units total) by mouth daily. 09/11/22   Federico Norleen ONEIDA MADISON, MD  [employer] VITAMIN B12 1000 MCG tablet TAKE 1 TABLET BY MOUTH EVERY DAY 02/15/23   Thayil, Irene T, PA-C  furosemide  (LASIX ) 20 MG tablet Three times a week as needed; take potassium if taking lasix  04/01/24   Patsy Lenis, MD   lenalidomide  (REVLIMID ) 5 MG capsule Take 1 capsule (5 mg total) by mouth daily. Take 1 capsule daily for 21 days and then none for 7 days. 09/26/24   Federico Norleen ONEIDA MADISON, MD  levothyroxine  (SYNTHROID , LEVOTHROID) 100 MCG tablet Take 100 mcg by mouth daily before breakfast. 12/29/15   [provider]  losartan  (COZAAR ) 50 MG tablet Take 1 tablet (50 mg total) by mouth daily. Take at night 08/27/24   Meng, Hao, [name]  metoprolol  succinate (TOPROL -XL) 50 MG 24 hr tablet Take 50 mg by mouth daily. Take with or immediately following a meal. Take in the Morning    [provider]  Multiple Vitamins-Minerals (PRESERVISION AREDS) CAPS Take 1 capsule by mouth 2 (two) times daily.    [provider]  pantoprazole  (PROTONIX ) 40 MG tablet Take 1 tablet (40 mg total) by mouth daily. 04/01/24   Patsy Lenis, MD  potassium chloride  SA (KLOR-CON  M)  20 MEQ tablet Three times a week as needed only if taking lasix  04/01/24   Patsy Lenis, MD  rosuvastatin  (CRESTOR ) 20 MG tablet Take 1 tablet (20 mg total) by mouth daily. 04/02/24   Patsy Lenis, MD    Physical Exam: BP (!) 158/63 (BP Location: Left Arm)   Pulse 80   Temp 98.2 F (36.8 C) (Oral)   Resp (!) 21   Ht 5' 4 (1.626 m)   Wt 85 kg   SpO2 96%   BMI 32.17 kg/m   General: 89 y.o. year-old female well developed well nourished in no acute distress.  Alert and oriented x3. Cardiovascular: Regular rate and rhythm with no rubs or gallops.  No thyromegaly or JVD noted.  2+ pitting edema in lower extremities bilaterally.  Respiratory: Diffuse Rales noted bilaterally. Good inspiratory effort. Abdomen: Soft nontender nondistended with normal bowel sounds x4 quadrants. Muskuloskeletal: No cyanosis or clubbing noted bilaterally Neuro: CN II-XII intact, strength, sensation, reflexes Skin: No ulcerative lesions noted or rashes Psychiatry: Judgement and insight appear normal. Mood is appropriate for condition and setting          Labs on  Admission:  Basic Metabolic Panel: Recent Labs  Lab 10/05/24 0300  NA 137  K 4.2  CL 105  CO2 21*  GLUCOSE 105*  BUN 20  CREATININE 1.16*  CALCIUM  8.1*   Liver Function Tests: Recent Labs  Lab 10/05/24 0300  AST 17  ALT 10  ALKPHOS 69  BILITOT 1.3*  PROT 6.0*  ALBUMIN 2.7*   No results for input(s): LIPASE, AMYLASE in the last 168 hours. No results for input(s): AMMONIA in the last 168 hours. CBC: Recent Labs  Lab 10/05/24 0300  WBC 5.2  NEUTROABS 3.3  HGB 9.7*  HCT 31.5*  MCV 92.6  PLT 144*   Cardiac Enzymes: No results for input(s): CKTOTAL, CKMB, CKMBINDEX, TROPONINI in the last 168 hours.  BNP (last 3 results) Recent Labs    10/05/24 0300  BNP 608.5*    ProBNP (last 3 results) No results for input(s): PROBNP in the last 8760 hours.  CBG: No results for input(s): GLUCAP in the last 168 hours.  Radiological Exams on Admission: CT Angio Chest Pulmonary Embolism (PE) W or WO Contrast Result Date: 10/05/2024 EXAM: CTA of the Chest with contrast for PE 10/05/2024 04:21:04 AM TECHNIQUE: CTA of the chest was performed without and with the administration of intravenous contrast. Multiplanar reformatted images are provided for review. MIP images are provided for review. Automated exposure control, iterative reconstruction, and/or weight based adjustment of the mA/kV was utilized to reduce the radiation dose to as low as reasonably achievable. CONTRAST: 75 mL iohexol  (OMNIPAQUE ) 350 MG/ML injection. COMPARISON: CTA chest abdomen and pelvis 08/15/2008, portable chest x ray earlier today, chest CT 01/11/2024. CLINICAL HISTORY: 89 year old female Pulmonary embolism (PE) suspected, high probability. FINDINGS: PULMONARY ARTERIES: Suboptimal but adequate pulmonary artery contrast timing. No pulmonary artery filling defect identified. Distal branches in both the upper lobes and at the lung bases are obscured by mild respiratory motion. Main pulmonary  artery is normal in caliber. MEDIASTINUM: Left chest cardiac pacemaker device now in place. Prosthetic aortic valve. Calcified coronary artery atherosclerosis (series 7 image 146) and extensive calcified atherosclerosis of the visible aorta. No pericardial effusion. LYMPH NODES: Reactive appearing mediastinal lymph nodes not significantly changed from 2019. No hilar or axillary lymphadenopathy. LUNGS AND PLEURA: Small bilateral layering pleural effusions with simple fluid density suggesting transudates. Extensive nodular bilateral lung opacity  right greater than left in the upper lobes, superior segments of the lower lobes, middle lobe less affected. Confluent bilateral lower lobe lung opacity appears to reflect a combination of compressive atelectasis and airspace disease. Bilateral lung opacity most compatible with extensive infection, pneumonia. Early consolidation in the lower lobes. Pulmonary edema considered but unlikely. No pneumothorax. UPPER ABDOMEN: Chronic benign anterior right hepatic lobe 7 cm hemangioma, stable in size and configuration since 2019 when it demonstrated typical peripheral puddling enhancement pattern. Chronic mild elevation of the right hemidiaphragm is stable. Otherwise negative visible mostly non-contrast liver, spleen, pancreas, adrenal glands and bowel in the upper abdomen. SOFT TISSUES AND BONES: Diffuse osteopenia. Chronic thoracic compression fractures, some previously augmented and stable. Healing mid sternal fracture since the previous CT. Healing right 7th and 8th rib fractures. No acute osseous abnormality. No acute soft tissue abnormality. IMPRESSION: 1. No pulmonary embolism identified; suboptimal evaluation due to contrast timing and mild respiratory motion. 2. Widespread bilateral lung opacity most compatible with infection / developing multilobar pneumonia. Small bilateral layering pleural effusions and early consolidation in the lower lobes. Reactive mediastinal lymph  nodes. Pulmonary edema was also considered but felt unlikely. 3. Chronic osteopenia and compression fractures. Healing sternal and right rib fractures since March. Electronically signed by: Helayne Hurst MD 10/05/2024 04:40 AM EST RP Workstation: [id number]   DG Chest Portable 1 View Result Date: 10/05/2024 EXAM: 1 VIEW(S) XRAY OF THE CHEST 10/05/2024 03:09:37 AM COMPARISON: 07/24/2024 CLINICAL HISTORY: sob FINDINGS: LINES, TUBES AND DEVICES: Left chest pacemaker with leads overlying right atrium and right ventricle. Aortic valve replacement noted. LUNGS AND PLEURA: Right upper lobe airspace opacity. Chronic coarsened interstitial markings with mild edema. Bilateral trace pleural effusions. No pneumothorax. HEART AND MEDIASTINUM: Cardiomegaly. Atherosclerotic plaque noted. Left chest pacemaker with leads overlying right atrium and right ventricle. Aortic valve replacement noted. BONES AND SOFT TISSUES: Multilevel mid to lower thoracic vertebral augmentation. IMPRESSION: 1. Right upper lobe airspace opacity, suspicious for pneumonia or aspiration. 2. Chronic interstitial lung disease with mild interstitial edema. 3. Bilateral trace pleural effusions. Electronically signed by: Oneil Devonshire MD 10/05/2024 03:20 AM EST RP Workstation: [id number]    EKG: I independently viewed the EKG done and my findings are as followed: Sinus rhythm rate of 73.  QTc 492.  Assessment/Plan Present on Admission:  Acute on chronic diastolic (congestive) heart failure (HCC)  Principal Problem:   Acute on chronic diastolic (congestive) heart failure (HCC)  Acute on chronic HFpEF Volume overload on exam BNP greater than 600 Continue diuresing Monitor strict I's and O's and daily weight Follow-up 2D echo Consider cardiology consultation in the morning.  Paroxysmal A-fib on Eliquis  Resume home Eliquis  Currently rate controlled Continue to monitor on telemetry  Chronic normocytic anemia likely secondary to multiple  myeloma Last hemoglobin 8.8 Monitor and transfuse as indicated. Recommend close follow-up with hematology/oncology outpatient.  Hypokalemia Serum potassium 3.4 Repleted orally. Check magnesium  level.  Multiple myeloma Generalized weakness Fall precautions. PT OT evaluation Ambulates with a walker at baseline. Resume home Revlimid   Hyperlipidemia Resume home statin  Hypothyroidism Resume home levothyroxine    Time: 75 minutes.   DVT prophylaxis: Home Eliquis   Code Status: Full code  Family Communication: None at bedside  Disposition Plan: Admitted to telemetry unit.  Consults called: None.  Admission status: Inpatient status.   Status is: Inpatient The patient requires at least 2 midnights for further evaluation and treatment of present condition.   Terry LOISE Hurst MD [facility] Pager [phone]  If 7PM-7AM, please  contact night-coverage [email or web address] Password [email or web address]  10/05/2024, 5:28 AM      [1]  Allergies Allergen Reactions   Quinolones Other (See Comments)    Patient on Amiodarone  and can prolong QT   Penicillins Other (See Comments)    UNSPECIFIED REACTION  Patient does not remember reaction.  Has patient had a PCN reaction causing immediate rash, facial/tongue/throat swelling, SOB or lightheadedness with hypotension: no Has patient had a PCN reaction causing severe rash involving mucus membranes or skin necrosis: no Has patient had a PCN reaction that required hospitalization no Has patient had a PCN reaction occurring within the last 10 years: no If all of the above answers are NO, then may proceed with Cephalosporin use.    Sulfa Antibiotics Other (See Comments)    UNSPECIFIED REACTION  maybe vision issues?

## 2024-10-05 NOTE — ED Notes (Signed)
 CCMD called for continuous cardiac monitoring.

## 2024-10-06 ENCOUNTER — Inpatient Hospital Stay (HOSPITAL_COMMUNITY)

## 2024-10-06 ENCOUNTER — Telehealth: Payer: Self-pay

## 2024-10-06 DIAGNOSIS — R918 Other nonspecific abnormal finding of lung field: Secondary | ICD-10-CM

## 2024-10-06 DIAGNOSIS — J9611 Chronic respiratory failure with hypoxia: Secondary | ICD-10-CM

## 2024-10-06 DIAGNOSIS — J189 Pneumonia, unspecified organism: Secondary | ICD-10-CM

## 2024-10-06 DIAGNOSIS — I5031 Acute diastolic (congestive) heart failure: Secondary | ICD-10-CM

## 2024-10-06 DIAGNOSIS — Z79899 Other long term (current) drug therapy: Secondary | ICD-10-CM

## 2024-10-06 LAB — BASIC METABOLIC PANEL WITH GFR
Anion gap: 8 (ref 5–15)
BUN: 22 mg/dL (ref 8–23)
CO2: 30 mmol/L (ref 22–32)
Calcium: 7.9 mg/dL — ABNORMAL LOW (ref 8.9–10.3)
Chloride: 102 mmol/L (ref 98–111)
Creatinine, Ser: 1.3 mg/dL — ABNORMAL HIGH (ref 0.44–1.00)
GFR, Estimated: 40 mL/min — ABNORMAL LOW (ref >60)
Glucose, Bld: 104 mg/dL — ABNORMAL HIGH (ref 70–99)
Potassium: 3.9 mmol/L (ref 3.5–5.1)
Sodium: 140 mmol/L (ref 135–145)

## 2024-10-06 LAB — ECHOCARDIOGRAM COMPLETE
AR max vel: 0.65 cm2
AV Area VTI: 0.76 cm2
AV Area mean vel: 0.67 cm2
AV Mean grad: 24 mmHg
AV Peak grad: 40.5 mmHg
Ao pk vel: 3.18 m/s
Area-P 1/2: 3.34 cm2
Height: 64 in
S' Lateral: 2.9 cm
Weight: 2998.26 [oz_av]

## 2024-10-06 LAB — PHOSPHORUS: Phosphorus: 4.5 mg/dL (ref 2.5–4.6)

## 2024-10-06 LAB — MAGNESIUM: Magnesium: 1.7 mg/dL (ref 1.7–2.4)

## 2024-10-06 MED ORDER — AMIODARONE HCL 200 MG PO TABS: 100.0000 mg | ORAL_TABLET | Freq: Every morning | ORAL | Status: DC

## 2024-10-06 MED ORDER — FUROSEMIDE 10 MG/ML IJ SOLN
40.0000 mg | Freq: Two times a day (BID) | INTRAMUSCULAR | Status: DC
  Administered 2024-10-06 – 2024-10-07 (×2): 40 mg via INTRAVENOUS
  Filled 2024-10-06 (×2): qty 4

## 2024-10-06 NOTE — Evaluation (Signed)
 Physical Therapy Evaluation Patient Details Name: Zoe [name] MRN: [id number] DOB: 25-Sep-1934 Today's Date: 10/06/2024  History of Present Illness  89 yo F adm 12/14  SOB, fever, cough, congestion Pt workup reveals L Lung PNA and acute CHF exacerbation PMH HFpEF, CHF, A-fib on Eliquis  and amiodarone , hypothyroidism, HTN, breast CA,multiple myeloma,aortic stenosis status post TAVR, TIA, bil TKA  Clinical Impression  Pt presents with admitting diagnosis above. Pt received working with OT. Pt today was able to ambulate in hallway with RW at supervision level. Pt received on 6L at 100%. Pt dropped to 2L for ambulation in hallway with desat to 85% noted however unreliable pleth. Pt returned to room on RA satting at 97% with good pleth. PTA pt was Mod I with rollator. Recommend HHPT upon DC at facility. May likely only need 1 more session then handoff to mobility team. PT will continue to follow.          If plan is discharge home, recommend the following: A little help with walking and/or transfers;A little help with bathing/dressing/bathroom;Assist for transportation;Help with stairs or ramp for entrance   Can travel by private vehicle        Equipment Recommendations None recommended by PT  Recommendations for Other Services       Functional Status Assessment Patient has had a recent decline in their functional status and demonstrates the ability to make significant improvements in function in a reasonable and predictable amount of time.     Precautions / Restrictions Precautions Precautions: Fall Restrictions Weight Bearing Restrictions Per Provider Order: No      Mobility  Bed Mobility Overal bed mobility: Needs Assistance Bed Mobility: Sit to Supine       Sit to supine: Min assist   General bed mobility comments: stretcher    Transfers Overall transfer level: Needs assistance Equipment used: Rolling walker (2 wheels) Transfers: Sit to/from Stand Sit to Stand:  Supervision                Ambulation/Gait Ambulation/Gait assistance: Supervision Gait Distance (Feet): 50 Feet Assistive device: Rolling walker (2 wheels) Gait Pattern/deviations: Trunk flexed, Decreased stride length, Step-through pattern Gait velocity: decreased     General Gait Details: Pt with slow cautious gait pattern. No LOB noted.  Stairs            Wheelchair Mobility     Tilt Bed    Modified Rankin (Stroke Patients Only)       Balance Overall balance assessment: Needs assistance Sitting-balance support: Feet supported Sitting balance-Leahy Scale: Good     Standing balance support: Reliant on assistive device for balance Standing balance-Leahy Scale: Fair                               Pertinent Vitals/Pain Pain Assessment Pain Assessment: Faces Faces Pain Scale: Hurts a little bit Pain Location: R flank Pain Descriptors / Indicators: Aching    Home Living Family/patient expects to be discharged to:: Other (Comment)                   Additional Comments: ILF apartment on the first floor    Prior Function Prior Level of Function : Independent/Modified Independent             Mobility Comments: rollator for ambulation, help for transportation ADLs Comments: mod I ADLs. does some meal prep in her apartment, has one meal in dining room  Extremity/Trunk Assessment   Upper Extremity Assessment Upper Extremity Assessment: Overall WFL for tasks assessed    Lower Extremity Assessment Lower Extremity Assessment: Overall WFL for tasks assessed    Cervical / Trunk Assessment Cervical / Trunk Assessment: Kyphotic  Communication   Communication Communication: No apparent difficulties Factors Affecting Communication: Hearing impaired    Cognition Arousal: Alert Behavior During Therapy: WFL for tasks assessed/performed                             Following commands: Intact       Cueing Cueing  Techniques: Verbal cues, Tactile cues     General Comments General comments (skin integrity, edema, etc.): Pt received on 6L at 100%. Pt dropped to 2L for ambulation in hallway with desat to 85% noted however unreliable pleth. Pt returned to room on RA satting at 97% with good pleth.    Exercises     Assessment/Plan    PT Assessment Patient needs continued PT services  PT Problem List Decreased strength;Decreased range of motion;Decreased activity tolerance;Decreased balance;Decreased mobility;Decreased coordination;Decreased knowledge of use of DME;Decreased safety awareness;Cardiopulmonary status limiting activity;Decreased knowledge of precautions       PT Treatment Interventions DME instruction;Gait training;Functional mobility training;Stair training;Therapeutic activities;Therapeutic exercise;Neuromuscular re-education;Balance training;Patient/family education;Cognitive remediation    PT Goals (Current goals can be found in the Care Plan section)  Acute Rehab PT Goals Patient Stated Goal: to get better PT Goal Formulation: With patient Time For Goal Achievement: 10/20/24 Potential to Achieve Goals: Good    Frequency Min 2X/week     Co-evaluation               AM-PAC PT 6 Clicks Mobility  Outcome Measure Help needed turning from your back to your side while in a flat bed without using bedrails?: A Little Help needed moving from lying on your back to sitting on the side of a flat bed without using bedrails?: A Little Help needed moving to and from a bed to a chair (including a wheelchair)?: A Little Help needed standing up from a chair using your arms (e.g., wheelchair or bedside chair)?: A Little Help needed to walk in hospital room?: A Little Help needed climbing 3-5 steps with a railing? : A Little 6 Click Score: 18    End of Session Equipment Utilized During Treatment: Gait belt;Oxygen Activity Tolerance: Patient tolerated treatment well Patient left: in  bed;with call bell/phone within reach Nurse Communication: Mobility status PT Visit Diagnosis: Other abnormalities of gait and mobility (R26.89)    Time: 9081-9059 PT Time Calculation (min) (ACUTE ONLY): 22 min   Charges:   PT Evaluation $PT Eval Moderate Complexity: 1 Mod   PT General Charges $$ ACUTE PT VISIT: 1 Visit         Sueellen NOVAK, PT, DPT Acute Rehab Services [phone]   Marian Meneely 10/06/2024, 11:09 AM

## 2024-10-06 NOTE — ED Notes (Signed)
 ECHO at bedside.

## 2024-10-06 NOTE — Progress Notes (Signed)
 Arrived to [facility] 3E 3E17  Placed on cardiac monitor.  ECHO at bedside at the same time.  Will obtain vital signs after completion of ECHO.

## 2024-10-06 NOTE — Progress Notes (Signed)
 PROGRESS NOTE  Zoe [name] FMW:[id number] DOB: 02-Mar-1934 DOA: 10/05/2024 PCP: Gerome Brunet, DO   LOS: 1 day   Brief narrative:  Zoe [name] is a 89 y.o. female with past medical history significant for paroxysmal A-fib on Eliquis , chronic HFpEF, hypothyroidism, hypertension, hyperlipidemia, GERD, multiple myeloma on Revlimid , presented to hospital with gradually progressive shortness of breath for 5 days with bilateral lower extremity edema, subjective fever cough and congestion.  In the ED, patient was afebrile but tachypneic and needed supplemental oxygen..  Chest x-ray showed right upper lobe airspace opacity suspicious for pneumonia or aspiration with chronic interstitial findings.  Had bilateral trace pleural effusion as well.  Labs were notable for BNP more than 600.  In the ED patient was given IV Lasix , empiric IV antibiotics with, Rocephin  and azithromycin  and was admitted hospital for further evaluation and treatment.     Assessment/Plan: Principal Problem:   Acute on chronic diastolic (congestive) heart failure (HCC)  Acute on chronic HFpEF History of severe AS status post TAVR in the past.  BNP more than 600.  Was volume overloaded on presentation.  Continue strict intake output charting and daily weights, check 2D echocardiogram.  Patient received 60 mg of IV Lasix  yesterday. Seen by Dr. Margaretann, cardiology in the past.  Urine output 700 mL in last 24 hours.  Was given torsemide  yesterday due to lack of IV access.  Cardiology has been consulted at this time.  Will follow recommendations.  Blood cultures negative in 1 day.  Some improvement noted today.  No obvious signs of infection so no antibiotics have been continued   Paroxysmal A-fib on Eliquis  Rate controlled at this time.  Continue Eliquis .   Chronic normocytic anemia likely secondary to multiple myeloma Last hemoglobin of 9.7 from 8.8.  Patient follows up with oncology as outpatient.    Hypokalemia Improved initial potassium was 3.4.  Potassium has been replenished and latest potassium at 3.9.     Multiple myeloma Generalized weakness Ambulates with the help of walker at home.  PT OT recommends home health on discharge.   Hyperlipidemia Continue statin   Hypothyroidism Continue Synthroid   Class I obesity. Body mass index is 32.17 kg/m.  Would benefit from lifestyle modification as outpatient   DVT prophylaxis:  apixaban  (ELIQUIS ) tablet 5 mg   Disposition: Home with home health likely in 1 to 2 days  Status is: Inpatient Remains inpatient appropriate because: Pending clinical improvement    Code Status:     Code Status: Full Code  Family Communication: None at bedside  Consultants: Cardiology  Procedures: None  Anti-infectives:  Acyclovir   Anti-infectives (From admission, onward)    Start     Dose/Rate Route Frequency Ordered Stop   10/05/24 1430  acyclovir  (ZOVIRAX ) tablet 400 mg        400 mg Oral 2 times daily 10/05/24 1415     10/05/24 0800  azithromycin  (ZITHROMAX ) 500 mg in sodium chloride  0.9 % 250 mL IVPB        500 mg 250 mL/hr over 60 Minutes Intravenous  Once 10/05/24 0751 10/05/24 0946   10/05/24 0800  cefTRIAXone  (ROCEPHIN ) 1 g in sodium chloride  0.9 % 100 mL IVPB        1 g 200 mL/hr over 30 Minutes Intravenous  Once 10/05/24 0751 10/05/24 0838   10/05/24 0515  cefTRIAXone  (ROCEPHIN ) 1 g in sodium chloride  0.9 % 100 mL IVPB  Status:  Discontinued        1 g 200  mL/hr over 30 Minutes Intravenous  Once 10/05/24 0514 10/05/24 0752   10/05/24 0515  azithromycin  (ZITHROMAX ) 500 mg in sodium chloride  0.9 % 250 mL IVPB  Status:  Discontinued        500 mg 250 mL/hr over 60 Minutes Intravenous  Once 10/05/24 0514 10/05/24 0752        Subjective: Today, patient was seen and examined at bedside.  Patient states that she feels a little better with breathing today.  No chest pain, fever, chills or rigor.  Denies nausea vomiting or  abdominal pain.  Objective: Vitals:   10/06/24 1016 10/06/24 1200  BP: (!) 123/54 (!) 119/51  Pulse:  (!) 58  Resp:  (!) 27  Temp:    SpO2:  92%   No intake or output data in the 24 hours ending 10/06/24 1343 Filed Weights   10/05/24 0257  Weight: 85 kg   Body mass index is 32.17 kg/m.   Physical Exam: GENERAL: Patient is alert awake and oriented. Not in obvious distress.  Obese build.  Elderly female. HENT: No scleral pallor or icterus. Pupils equally reactive to light. Oral mucosa is moist NECK: is supple, no gross swelling noted. CHEST: Diminished breath sounds bilaterally. CVS: S1 and S2 heard, no murmur. Regular rate and rhythm.  ABDOMEN: Soft, non-tender, bowel sounds are present. EXTREMITIES: Bilateral leg edema CNS: Cranial nerves are intact. No focal motor deficits. SKIN: warm and dry without rashes.  Data Review: I have personally reviewed the following laboratory data and studies,  CBC: Recent Labs  Lab 10/05/24 0300 10/05/24 0626  WBC 5.2  --   NEUTROABS 3.3  --   HGB 9.7* 8.8*  HCT 31.5* 26.0*  MCV 92.6  --   PLT 144*  --    Basic Metabolic Panel: Recent Labs  Lab 10/05/24 0300 10/05/24 0626 10/06/24 0748  NA 137 138 140  K 4.2 3.4* 3.9  CL 105  --  102  CO2 21*  --  30  GLUCOSE 105*  --  104*  BUN 20  --  22  CREATININE 1.16*  --  1.30*  CALCIUM  8.1*  --  7.9*  MG  --   --  1.7  PHOS  --   --  4.5   Liver Function Tests: Recent Labs  Lab 10/05/24 0300  AST 17  ALT 10  ALKPHOS 69  BILITOT 1.3*  PROT 6.0*  ALBUMIN 2.7*   No results for input(s): LIPASE, AMYLASE in the last 168 hours. No results for input(s): AMMONIA in the last 168 hours. Cardiac Enzymes: No results for input(s): CKTOTAL, CKMB, CKMBINDEX, TROPONINI in the last 168 hours. BNP (last 3 results) Recent Labs    10/05/24 0300  BNP 608.5*    ProBNP (last 3 results) No results for input(s): PROBNP in the last 8760 hours.  CBG: No results for  input(s): GLUCAP in the last 168 hours. Recent Results (from the past 240 hours)  Blood culture (routine x 2)     Status: None (Preliminary result)   Collection Time: 10/05/24  5:20 AM   Specimen: BLOOD RIGHT ARM  Result Value Ref Range Status   Specimen Description BLOOD RIGHT ARM  Final   Special Requests   Final    BOTTLES DRAWN AEROBIC AND ANAEROBIC Blood Culture adequate volume   Culture   Final    NO GROWTH 1 DAY Performed at [facility], [address] [place] [address], [place], [place] [address]    Report Status PENDING  Incomplete  Blood culture (routine x 2)     Status: None (Preliminary result)   Collection Time: 10/05/24  6:16 AM   Specimen: BLOOD  Result Value Ref Range Status   Specimen Description BLOOD BLOOD RIGHT HAND  Final   Special Requests   Final    BOTTLES DRAWN AEROBIC AND ANAEROBIC Blood Culture adequate volume   Culture   Final    NO GROWTH 1 DAY Performed at [facility], [address] [place], [place] [address]    Report Status PENDING  Incomplete  Resp panel by RT-PCR (RSV, Flu A&B, Covid) Anterior Nasal Swab     Status: None   Collection Time: 10/05/24  7:38 AM   Specimen: Anterior Nasal Swab  Result Value Ref Range Status   SARS Coronavirus 2 by RT PCR NEGATIVE NEGATIVE Final   Influenza A by PCR NEGATIVE NEGATIVE Final   Influenza B by PCR NEGATIVE NEGATIVE Final    Comment: (NOTE) The Xpert Xpress SARS-CoV-2/FLU/RSV plus assay is intended as an aid in the diagnosis of influenza from Nasopharyngeal swab specimens and should not be used as a sole basis for treatment. Nasal washings and aspirates are unacceptable for Xpert Xpress SARS-CoV-2/FLU/RSV testing.  Fact Sheet for Patients: bloggercourse.com  Fact Sheet for Healthcare Providers: seriousbroker.it  This test is not yet approved or cleared by the [place]  FDA and has been authorized for detection and/or diagnosis of SARS-CoV-2  by FDA under an Emergency Use Authorization (EUA). This EUA will remain in effect (meaning this test can be used) for the duration of the COVID-19 declaration under Section 564(b)(1) of the Act, 21 U.S.C. section 360bbb-3(b)(1), unless the authorization is terminated or revoked.     Resp Syncytial Virus by PCR NEGATIVE NEGATIVE Final    Comment: (NOTE) Fact Sheet for Patients: bloggercourse.com  Fact Sheet for Healthcare Providers: seriousbroker.it  This test is not yet approved or cleared by the [place]  FDA and has been authorized for detection and/or diagnosis of SARS-CoV-2 by FDA under an Emergency Use Authorization (EUA). This EUA will remain in effect (meaning this test can be used) for the duration of the COVID-19 declaration under Section 564(b)(1) of the Act, 21 U.S.C. section 360bbb-3(b)(1), unless the authorization is terminated or revoked.  Performed at [facility], [address], [place], [place] [address]      Studies: CT Angio Chest Pulmonary Embolism (PE) W or WO Contrast Result Date: 10/05/2024 EXAM: CTA of the Chest with contrast for PE 10/05/2024 04:21:04 AM TECHNIQUE: CTA of the chest was performed without and with the administration of intravenous contrast. Multiplanar reformatted images are provided for review. MIP images are provided for review. Automated exposure control, iterative reconstruction, and/or weight based adjustment of the mA/kV was utilized to reduce the radiation dose to as low as reasonably achievable. CONTRAST: 75 mL iohexol  (OMNIPAQUE ) 350 MG/ML injection. COMPARISON: CTA chest abdomen and pelvis 08/15/2008, portable chest x ray earlier today, chest CT 01/11/2024. CLINICAL HISTORY: 89 year old female Pulmonary embolism (PE) suspected, high probability. FINDINGS: PULMONARY ARTERIES: Suboptimal but adequate pulmonary artery contrast timing. No pulmonary artery filling defect  identified. Distal branches in both the upper lobes and at the lung bases are obscured by mild respiratory motion. Main pulmonary artery is normal in caliber. MEDIASTINUM: Left chest cardiac pacemaker device now in place. Prosthetic aortic valve. Calcified coronary artery atherosclerosis (series 7 image 146) and extensive calcified atherosclerosis of the visible aorta. No pericardial effusion. LYMPH NODES: Reactive appearing  mediastinal lymph nodes not significantly changed from 2019. No hilar or axillary lymphadenopathy. LUNGS AND PLEURA: Small bilateral layering pleural effusions with simple fluid density suggesting transudates. Extensive nodular bilateral lung opacity right greater than left in the upper lobes, superior segments of the lower lobes, middle lobe less affected. Confluent bilateral lower lobe lung opacity appears to reflect a combination of compressive atelectasis and airspace disease. Bilateral lung opacity most compatible with extensive infection, pneumonia. Early consolidation in the lower lobes. Pulmonary edema considered but unlikely. No pneumothorax. UPPER ABDOMEN: Chronic benign anterior right hepatic lobe 7 cm hemangioma, stable in size and configuration since 2019 when it demonstrated typical peripheral puddling enhancement pattern. Chronic mild elevation of the right hemidiaphragm is stable. Otherwise negative visible mostly non-contrast liver, spleen, pancreas, adrenal glands and bowel in the upper abdomen. SOFT TISSUES AND BONES: Diffuse osteopenia. Chronic thoracic compression fractures, some previously augmented and stable. Healing mid sternal fracture since the previous CT. Healing right 7th and 8th rib fractures. No acute osseous abnormality. No acute soft tissue abnormality. IMPRESSION: 1. No pulmonary embolism identified; suboptimal evaluation due to contrast timing and mild respiratory motion. 2. Widespread bilateral lung opacity most compatible with infection / developing  multilobar pneumonia. Small bilateral layering pleural effusions and early consolidation in the lower lobes. Reactive mediastinal lymph nodes. Pulmonary edema was also considered but felt unlikely. 3. Chronic osteopenia and compression fractures. Healing sternal and right rib fractures since March. Electronically signed by: Helayne Hurst MD 10/05/2024 04:40 AM EST RP Workstation: [id number]   DG Chest Portable 1 View Result Date: 10/05/2024 EXAM: 1 VIEW(S) XRAY OF THE CHEST 10/05/2024 03:09:37 AM COMPARISON: 07/24/2024 CLINICAL HISTORY: sob FINDINGS: LINES, TUBES AND DEVICES: Left chest pacemaker with leads overlying right atrium and right ventricle. Aortic valve replacement noted. LUNGS AND PLEURA: Right upper lobe airspace opacity. Chronic coarsened interstitial markings with mild edema. Bilateral trace pleural effusions. No pneumothorax. HEART AND MEDIASTINUM: Cardiomegaly. Atherosclerotic plaque noted. Left chest pacemaker with leads overlying right atrium and right ventricle. Aortic valve replacement noted. BONES AND SOFT TISSUES: Multilevel mid to lower thoracic vertebral augmentation. IMPRESSION: 1. Right upper lobe airspace opacity, suspicious for pneumonia or aspiration. 2. Chronic interstitial lung disease with mild interstitial edema. 3. Bilateral trace pleural effusions. Electronically signed by: Oneil Devonshire MD 10/05/2024 03:20 AM EST RP Workstation: [id number] Vernal Alstrom, MD  [facility] 10/06/2024  If 7PM-7AM, please contact night-coverage

## 2024-10-06 NOTE — Telephone Encounter (Signed)
 Pt called and cancelled appts for tomorrow as she is in the hospital. Returned call and left a message that 10/07/24 appts have been canceled and we hope she is feeling better soon.

## 2024-10-06 NOTE — Consult Note (Signed)
 Cardiology Consultation   Patient ID: CARTHA ROTERT MRN: [id number]; DOB: 27-Dec-1933  Admit date: 10/05/2024 Date of Consult: 10/06/2024  PCP:  Gerome Brunet, DO   [facility] Providers Cardiologist:  Gordy Bergamo, MD  Electrophysiologist:  Danelle Birmingham, MD    Patient Profile: Zoe [name] is a 89 y.o. female with a hx of mild CAD, severe AS status post TAVR, carotid artery disease, paroxysmal atrial fibrillation, hypertension, hyperlipidemia, CKD stage III, history of CVA, complete heart block status post Medtronic PPM and diastolic heart failure who is being seen 10/06/2024 for the evaluation of CHF at the request of Dr. Shona.  History of Present Illness: Zoe [name] is an 89 year old female with past medical history noted above.  She has been followed by Dr. Bergamo as an outpatient.  Admitted 09/2023 with episode of A-fib RVR.  She was brought in via EMS and converted to sinus rhythm with vagal maneuver.  Troponin was mildly elevated but flat and EKG was nonischemic.  Chest pain was felt to be secondary to musculoskeletal pain.  During that admission developed recurrent A-fib RVR and troponin levels were felt to be secondary to demand ischemia.  She has been managed with amiodarone  and Eliquis .  Admitted 12/2023 after a fall and noted to be in complete heart block and underwent pacemaker implantation.  Echocardiogram showed LVEF of 60 to 65%, grade 2 diastolic dysfunction, normal RV, severely dilated left atrium, mild MR, mild AI with stable TAVR valve.  Seen in the office with Dr. Bergamo 02/2024 and was dealing with balance issues and swelling.  It was recommended that she increase her diuretic.  Underwent CT head which showed findings suggestive of fibromuscular dysplasia with bilateral cervical ICA.  MRI was negative.  Seen in the office 11/5 with Hao Meng, PA for 65-month follow-up.  Currently resides at [facility] independent living facility.  She reported feeling weak  and tired in a few hours in the morning if she takes her medications.  She is only using her Lasix  as needed.  It was recommended that she move her losartan  and amlodipine  to nighttime and keep her metoprolol  in the a.m.  She was continued on Eliquis  and amiodarone  100 mg daily.  Presented to the ED on 12/14 with complaints of fever cough and congestion. Initially thought there was a issue with her heater in her appt causing her to be short of breath. Also noted some mild LE edema. Had not been taking PRN lasix .   In the ED her labs showed sodium 137, potassium 4.2, creatinine 1.16, albumin 2.7, BNP 608, high-sensitivity troponin 23, WBC 5.2, hemoglobin 9.7.  Respiratory panel negative. Notably hypoxic requiring [place] @2L . Chest x-ray concerning for right upper lobe pneumonia and bilateral trace pleural effusions.  EKG showed sinus rhythm, 73 bpm, incomplete right bundle branch block.  She was started on IV Lasix  and admitted to internal medicine for further management.  Cardiology asked to evaluate.    Past Medical History:  Diagnosis Date   Arthritis    some - per patient   Atrial fibrillation (HCC)    Breast cancer (HCC)    breast cancer / left    Cataract    bilat    GERD (gastroesophageal reflux disease)    History of kidney stones    Hyperlipidemia    Hypertension    Hypothyroidism    Macular degeneration    Left   S/P TAVR (transcatheter aortic valve replacement) 09/03/2018   23 mm Edwards Sapien  3 transcatheter heart valve placed via percutaneous right transfemoral approach    Severe aortic stenosis    Stress incontinence    Thyroid  disease    Tinnitus     Past Surgical History:  Procedure Laterality Date   ABDOMINAL HYSTERECTOMY  1970's   BACK SURGERY     BREAST LUMPECTOMY  12/1998   lumpectomy   CARDIAC CATHETERIZATION     CARDIOVERSION N/A 04/18/2023   Procedure: CARDIOVERSION;  Surgeon: Michele Richardson, DO;  Location: [facility];  Service: Cardiovascular;   Laterality: N/A;   CARDIOVERSION N/A 04/20/2023   Procedure: CARDIOVERSION;  Surgeon: Ladona Heinz, MD;  Location: [facility];  Service: Cardiovascular;  Laterality: N/A;   EYE SURGERY     cataract surgery bilat    INTRAOPERATIVE TRANSTHORACIC ECHOCARDIOGRAM N/A 09/03/2018   Procedure: INTRAOPERATIVE TRANSTHORACIC ECHOCARDIOGRAM;  Surgeon: Verlin Lonni BIRCH, MD;  Location: [facility] OR;  Service: Open Heart Surgery;  Laterality: N/A;   KYPHOPLASTY N/A 09/07/2022   Procedure: THORACIC EIGHT KYPHOPLASTY;  Surgeon: Burnetta Aures, MD;  Location: [facility] OR;  Service: Orthopedics;  Laterality: N/A;  1 hr Local with IV [facility] 3 C-Bed   LITHOTRIPSY     PACEMAKER IMPLANT N/A 01/09/2024   Procedure: PACEMAKER IMPLANT;  Surgeon: Waddell Danelle ORN, MD;  Location: [facility];  Service: Cardiovascular;  Laterality: N/A;   Right total knee     2018 Dr. Ernie   RIGHT/LEFT HEART CATH AND CORONARY ANGIOGRAPHY N/A 08/06/2018   Procedure: RIGHT/LEFT HEART CATH AND CORONARY ANGIOGRAPHY;  Surgeon: Ladona Heinz, MD;  Location: [facility];  Service: Cardiovascular;  Laterality: N/A;   THYROIDECTOMY, PARTIAL  1975   TONSILLECTOMY     as a child - patient not sure of exact date   TOTAL KNEE ARTHROPLASTY Left 03/13/2016   Procedure: TOTAL KNEE ARTHROPLASTY;  Surgeon: Donnice Ernie, MD;  Location: [facility] ORS;  Service: Orthopedics;  Laterality: Left;   TOTAL KNEE ARTHROPLASTY Right 06/18/2017   Procedure: RIGHT TOTAL KNEE ARTHROPLASTY;  Surgeon: Ernie Donnice, MD;  Location: [facility] OR[facility];  Service: Orthopedics;  Laterality: Right;   TRANSCATHETER AORTIC VALVE REPLACEMENT, TRANSFEMORAL N/A 09/03/2018   Procedure: TRANSCATHETER AORTIC VALVE REPLACEMENT, TRANSFEMORAL;  Surgeon: Verlin Lonni BIRCH, MD;  Location: [facility] OR;  Service: Open Heart Surgery;  Laterality: N/A;     Scheduled Meds:  acyclovir   400 mg Oral BID   amiodarone   50 mg Oral q AM   amLODipine   5 mg Oral QHS   apixaban   5 mg Oral BID    cholecalciferol   1,000 Units Oral q AM   cyanocobalamin   1,000 mcg Oral q AM   docusate sodium   100 mg Oral Daily   levothyroxine   100 mcg Oral Q0600   metoprolol  succinate  50 mg Oral Daily   multivitamin with minerals  1 tablet Oral Daily   pantoprazole   40 mg Oral Daily   rosuvastatin   20 mg Oral Daily   torsemide   20 mg Oral Daily   Continuous Infusions:  PRN Meds: acetaminophen , ipratropium-albuterol , melatonin, oxyCODONE , polyethylene glycol, prochlorperazine   Allergies:   Allergies[1]  Social History:   Social History   Socioeconomic History   Marital status: Widowed    Spouse name: Not on file   Number of children: 4   Years of education: Not on file   Highest education level: Master's degree (e.g., MA, MS, MEng, MEd, MSW, MBA)  Occupational History   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  Tobacco Use  Smoking status: Never   Smokeless tobacco: Never  Vaping Use   Vaping status: Never Used  Substance and Sexual Activity   Alcohol  use: No   Drug use: No   Sexual activity: Not Currently  Other Topics Concern   Not on file  Social History Narrative   Not on file   Social Drivers of Health   Tobacco Use: Low Risk (10/05/2024)   Patient History    Smoking Tobacco Use: Never    Smokeless Tobacco Use: Never    Passive Exposure: Not on file  Financial Resource Strain: Not on file  Food Insecurity: No Food Insecurity (04/01/2024)   Hunger Vital Sign    Worried About Running Out of Food in the Last Year: Never true    Ran Out of Food in the Last Year: Never true  Transportation Needs: No Transportation Needs (04/01/2024)   PRAPARE - Administrator, Civil Service (Medical): No    Lack of Transportation (Non-Medical): No  Physical Activity: Not on file  Stress: Not on file  Social Connections: Moderately Isolated (04/01/2024)   Social Connection and Isolation Panel    Frequency of Communication with Friends and Family:  More than three times a week    Frequency of Social Gatherings with Friends and Family: Once a week    Attends Religious Services: Never    Database Administrator or Organizations: No    Attends Banker Meetings: 1 to 4 times per year    Marital Status: Widowed  Intimate Partner Violence: Not At Risk (04/01/2024)   Humiliation, Afraid, Rape, and Kick questionnaire    Fear of Current or Ex-Partner: No    Emotionally Abused: No    Physically Abused: No    Sexually Abused: No  Depression (PHQ2-9): Low Risk (08/11/2024)   Depression (PHQ2-9)    PHQ-2 Score: 0  Alcohol  Screen: Not on file  Housing: Low Risk (04/01/2024)   Housing Stability Vital Sign    Unable to Pay for Housing in the Last Year: No    Number of Times Moved in the Last Year: 0    Homeless in the Last Year: No  Utilities: Not At Risk (04/01/2024)   AHC Utilities    Threatened with loss of utilities: No  Health Literacy: Not on file    Family History:    Family History  Problem Relation Age of Onset   Diabetes Mother    Stroke Mother        Carotid artery disease   Heart disease Father        CAD   Coronary artery disease Father    Diabetes Sister      ROS:  Please see the history of present illness.   All other ROS reviewed and negative.     Physical Exam/Data: Vitals:   10/06/24 0519 10/06/24 0800 10/06/24 1012 10/06/24 1016  BP: (!) 129/56 (!) 136/59 (!) 123/54 (!) 123/54  Pulse: 76 60 63   Resp: (!) 26 (!) 21 (!) 21   Temp: 98 F (36.7 C) 97.9 F (36.6 C) 97.6 F (36.4 C)   TempSrc: Oral Oral Oral   SpO2: 90% 99% 94%   Weight:      Height:       No intake or output data in the 24 hours ending 10/06/24 1216    10/05/2024    2:57 AM 09/09/2024   11:38 AM 08/27/2024   10:39 AM  Last 3 Weights  Weight (lbs) 187  lb 6.3 oz 176 lb 6.4 oz 176 lb 12.8 oz  Weight (kg) 85 kg 80.015 kg 80.196 kg     Body mass index is 32.17 kg/m.  General:  Well nourished, well developed, in no acute  distress HEENT: normal Neck: no JVD Vascular: No carotid bruits; Distal pulses 2+ bilaterally Cardiac:  normal S1, S2; RRR; no murmur  Lungs: slight diminished in bases Abd: soft, nontender, no hepatomegaly  Ext: no edema Musculoskeletal:  No deformities, BUE and BLE strength normal and equal Skin: warm and dry  Neuro: no focal abnormalities noted Psych:  Normal affect   EKG:  The EKG was personally reviewed and demonstrates:  sinus rhythm, 73 bpm, incomplete right bundle branch block Telemetry:  Telemetry was personally reviewed and demonstrates:  sinus rhythm, rates 70-80s  Relevant CV Studies:  Echo: 12/2023  IMPRESSIONS     1. Left ventricular ejection fraction, by estimation, is 60 to 65%. The  left ventricle has normal function. The left ventricle has no regional  wall motion abnormalities. Left ventricular diastolic parameters are  consistent with Grade II diastolic  dysfunction (pseudonormalization). Elevated left ventricular end-diastolic  pressure.   2. Right ventricular systolic function is normal. The right ventricular  size is normal. There is normal pulmonary artery systolic pressure.   3. Left atrial size was severely dilated.   4. The mitral valve is normal in structure. Mild mitral valve  regurgitation. No evidence of mitral stenosis. Moderate mitral annular  calcification.   5. The aortic valve has been repaired/replaced. Aortic valve  regurgitation is mild. No aortic stenosis is present. There is a 23 mm  Sapien valve present in the aortic position. Procedure Date: 09/03/2018.   6. The inferior vena cava is normal in size with greater than 50%  respiratory variability, suggesting right atrial pressure of 3 mmHg.   FINDINGS   Left Ventricle: Left ventricular ejection fraction, by estimation, is 60  to 65%. The left ventricle has normal function. The left ventricle has no  regional wall motion abnormalities. The left ventricular internal cavity  size was  normal in size. There is   no left ventricular hypertrophy. Left ventricular diastolic parameters  are consistent with Grade II diastolic dysfunction (pseudonormalization).  Elevated left ventricular end-diastolic pressure.   Right Ventricle: The right ventricular size is normal. No increase in  right ventricular wall thickness. Right ventricular systolic function is  normal. There is normal pulmonary artery systolic pressure. The tricuspid  regurgitant velocity is 2.39 m/s, and   with an assumed right atrial pressure of 3 mmHg, the estimated right  ventricular systolic pressure is 25.8 mmHg.   Left Atrium: Left atrial size was severely dilated.   Right Atrium: Right atrial size was normal in size.   Pericardium: There is no evidence of pericardial effusion.   Mitral Valve: The mitral valve is normal in structure. Moderate mitral  annular calcification. Mild mitral valve regurgitation. No evidence of  mitral valve stenosis.   Tricuspid Valve: The tricuspid valve is normal in structure. Tricuspid  valve regurgitation is mild . No evidence of tricuspid stenosis.   Aortic Valve: The aortic valve has been repaired/replaced. Aortic valve  regurgitation is mild. No aortic stenosis is present. Aortic valve mean  gradient measures 9.0 mmHg. Aortic valve peak gradient measures 17.1 mmHg.  Aortic valve area, by VTI measures  1.00 cm. There is a 23 mm Sapien valve present in the aortic position.  Procedure Date: 09/03/2018.   Pulmonic Valve: The pulmonic  valve was normal in structure. Pulmonic valve  regurgitation is trivial. No evidence of pulmonic stenosis.   Aorta: The aortic root is normal in size and structure.   Venous: The inferior vena cava is normal in size with greater than 50%  respiratory variability, suggesting right atrial pressure of 3 mmHg.   IAS/Shunts: No atrial level shunt detected by color flow Doppler.    Laboratory Data: High Sensitivity Troponin:   Recent  Labs  Lab 10/05/24 0300  TROPONINIHS 23*   No results for input(s): TRNPT in the last 720 hours.    Chemistry Recent Labs  Lab 10/05/24 0300 10/05/24 0626 10/06/24 0748  NA 137 138 140  K 4.2 3.4* 3.9  CL 105  --  102  CO2 21*  --  30  GLUCOSE 105*  --  104*  BUN 20  --  22  CREATININE 1.16*  --  1.30*  CALCIUM  8.1*  --  7.9*  MG  --   --  1.7  GFRNONAA 45*  --  40*  ANIONGAP 11  --  8    Recent Labs  Lab 10/05/24 0300  PROT 6.0*  ALBUMIN 2.7*  AST 17  ALT 10  ALKPHOS 69  BILITOT 1.3*   Lipids No results for input(s): CHOL, TRIG, HDL, LABVLDL, LDLCALC, CHOLHDL in the last 168 hours.  Hematology Recent Labs  Lab 10/05/24 0300 10/05/24 0626  WBC 5.2  --   RBC 3.40*  --   HGB 9.7* 8.8*  HCT 31.5* 26.0*  MCV 92.6  --   MCH 28.5  --   MCHC 30.8  --   RDW 17.7*  --   PLT 144*  --    Thyroid  No results for input(s): TSH, FREET4 in the last 168 hours.  BNP Recent Labs  Lab 10/05/24 0300  BNP 608.5*    DDimer No results for input(s): DDIMER in the last 168 hours.  Radiology/Studies:  CT Angio Chest Pulmonary Embolism (PE) W or WO Contrast Result Date: 10/05/2024 EXAM: CTA of the Chest with contrast for PE 10/05/2024 04:21:04 AM TECHNIQUE: CTA of the chest was performed without and with the administration of intravenous contrast. Multiplanar reformatted images are provided for review. MIP images are provided for review. Automated exposure control, iterative reconstruction, and/or weight based adjustment of the mA/kV was utilized to reduce the radiation dose to as low as reasonably achievable. CONTRAST: 75 mL iohexol  (OMNIPAQUE ) 350 MG/ML injection. COMPARISON: CTA chest abdomen and pelvis 08/15/2008, portable chest x ray earlier today, chest CT 01/11/2024. CLINICAL HISTORY: 89 year old female Pulmonary embolism (PE) suspected, high probability. FINDINGS: PULMONARY ARTERIES: Suboptimal but adequate pulmonary artery contrast timing. No pulmonary  artery filling defect identified. Distal branches in both the upper lobes and at the lung bases are obscured by mild respiratory motion. Main pulmonary artery is normal in caliber. MEDIASTINUM: Left chest cardiac pacemaker device now in place. Prosthetic aortic valve. Calcified coronary artery atherosclerosis (series 7 image 146) and extensive calcified atherosclerosis of the visible aorta. No pericardial effusion. LYMPH NODES: Reactive appearing mediastinal lymph nodes not significantly changed from 2019. No hilar or axillary lymphadenopathy. LUNGS AND PLEURA: Small bilateral layering pleural effusions with simple fluid density suggesting transudates. Extensive nodular bilateral lung opacity right greater than left in the upper lobes, superior segments of the lower lobes, middle lobe less affected. Confluent bilateral lower lobe lung opacity appears to reflect a combination of compressive atelectasis and airspace disease. Bilateral lung opacity most compatible with extensive infection, pneumonia.  Early consolidation in the lower lobes. Pulmonary edema considered but unlikely. No pneumothorax. UPPER ABDOMEN: Chronic benign anterior right hepatic lobe 7 cm hemangioma, stable in size and configuration since 2019 when it demonstrated typical peripheral puddling enhancement pattern. Chronic mild elevation of the right hemidiaphragm is stable. Otherwise negative visible mostly non-contrast liver, spleen, pancreas, adrenal glands and bowel in the upper abdomen. SOFT TISSUES AND BONES: Diffuse osteopenia. Chronic thoracic compression fractures, some previously augmented and stable. Healing mid sternal fracture since the previous CT. Healing right 7th and 8th rib fractures. No acute osseous abnormality. No acute soft tissue abnormality. IMPRESSION: 1. No pulmonary embolism identified; suboptimal evaluation due to contrast timing and mild respiratory motion. 2. Widespread bilateral lung opacity most compatible with infection  / developing multilobar pneumonia. Small bilateral layering pleural effusions and early consolidation in the lower lobes. Reactive mediastinal lymph nodes. Pulmonary edema was also considered but felt unlikely. 3. Chronic osteopenia and compression fractures. Healing sternal and right rib fractures since March. Electronically signed by: Helayne Hurst MD 10/05/2024 04:40 AM EST RP Workstation: [id number]   DG Chest Portable 1 View Result Date: 10/05/2024 EXAM: 1 VIEW(S) XRAY OF THE CHEST 10/05/2024 03:09:37 AM COMPARISON: 07/24/2024 CLINICAL HISTORY: sob FINDINGS: LINES, TUBES AND DEVICES: Left chest pacemaker with leads overlying right atrium and right ventricle. Aortic valve replacement noted. LUNGS AND PLEURA: Right upper lobe airspace opacity. Chronic coarsened interstitial markings with mild edema. Bilateral trace pleural effusions. No pneumothorax. HEART AND MEDIASTINUM: Cardiomegaly. Atherosclerotic plaque noted. Left chest pacemaker with leads overlying right atrium and right ventricle. Aortic valve replacement noted. BONES AND SOFT TISSUES: Multilevel mid to lower thoracic vertebral augmentation. IMPRESSION: 1. Right upper lobe airspace opacity, suspicious for pneumonia or aspiration. 2. Chronic interstitial lung disease with mild interstitial edema. 3. Bilateral trace pleural effusions. Electronically signed by: Oneil Devonshire MD 10/05/2024 03:20 AM EST RP Workstation: [id number]     Assessment and Plan:  Shuree  B Micheletti is a 89 y.o. female with a hx of mild CAD, severe AS status post TAVR, carotid artery disease, paroxysmal atrial fibrillation, hypertension, hyperlipidemia, CKD stage III, history of CVA, complete heart block status post Medtronic PPM and diastolic heart failure who is being seen 10/06/2024 for the evaluation of CHF at the request of Dr. Hurst.  Acute respiratory failure Acute on chronic HFpEF -- presented with worsening shortness of breath and LE edema. Also congestion and  fevers -- Echo 12/2023 with LVEF of 60 to 65%, grade 2 diastolic dysfunction, normal RV, severely dilated left atrium, mild MR -- BNP 608, Chest x-ray concerning for right upper lobe pneumonia and bilateral trace pleural effusions. Suspect multifactorial in the setting of CHF and infection.  -- given dose of Zithromax  and rocephin  on admission -- s/p IV lasix , feeling much better with breathing improved. Volume looks ok on exam. Has been resumed on torsemide  20mg  daily  -- continue Toprol  XL  Paroxysmal atrial fibrillation -- Sinus rhythm on admission -- Maintained on amiodarone  100 mg daily (ordered for 50mg  on admission, will adjust), Eliquis  5 mg twice daily  Aortic stenosis status post TAVR -- stable valve on echo 12/2023  CKD stage III -- baseline Cr around 1.1, up 1.3 today  Per primary Multiple myeloma Hypothyroidism Anemia Weakness    Risk Assessment/Risk Scores:  New [place]  Heart Association (NYHA) Functional Class NYHA Class II  CHA2DS2-VASc Score = 8   This indicates a 10.8% annual risk of stroke. The patient's score is based upon: CHF History: 1 HTN History: 1  Diabetes History: 0 Stroke History: 2 Vascular Disease History: 1 Age Score: 2 Gender Score: 1    For questions or updates, please contact [facility] Please consult [email or web address] for contact info under   Signed, Manuelita Rummer, NP  10/06/2024 12:16 PM     [1]  Allergies Allergen Reactions   Quinolones Other (See Comments)    Patient on Amiodarone  and can prolong QT   Penicillins Other (See Comments)    UNSPECIFIED REACTION  Patient does not remember reaction.  Has patient had a PCN reaction causing immediate rash, facial/tongue/throat swelling, SOB or lightheadedness with hypotension: no Has patient had a PCN reaction causing severe rash involving mucus membranes or skin necrosis: no Has patient had a PCN reaction that required hospitalization no Has patient had a PCN reaction  occurring within the last 10 years: no If all of the above answers are NO, then may proceed with Cephalosporin use.    Sulfa Antibiotics Other (See Comments)    UNSPECIFIED REACTION  maybe vision issues?

## 2024-10-06 NOTE — Progress Notes (Signed)
 Echocardiogram 2D Echocardiogram has been performed.  Zoe [name] 10/06/2024, 4:22 PM

## 2024-10-06 NOTE — Progress Notes (Unsigned)
 [facility] [phone]   [phone]   PROGRESS NOTE   Patient Care Team: Associates, [facility] as PCP - General (Rheumatology)   Hematological/Oncological History # IgG Lambda Multiple Myeloma  06/14/2022: establish care with [employer] due to anemia. Labs showed M protein 4.9, Kappa 7.2, Lambda 10.8, ratio 0.67 07/13/2022: Bmbx showed Lambda restricted plasma cell neoplasm involving approximately 60% of the cellular marrow by IHC on the biopsy.  08/01/2022: Cycle 1 Day 1 of VRd chemotherapy. (Holding revlimid  initially) 08/21/2022:  Cycle 2 Day 1 of VRd chemotherapy. (Holding revlimid ) 09/04/2022: Cycle 3 Day 1 of VRd chemotherapy. Started revlimid  10/03/2022: Cycle 4 Day 1 of VRd chemotherapy 10/31/2022: Cycle 5 Day 1 of VRd chemotherapy 11/20/2022: Cycle 6 Day 1 of VRd chemotherapy 12/11/2022: Cycle 7 Day 1 of VRd chemotherapy 01/02/2023: Cycle 8 Day 1 of VRd chemotherapy 01/22/2023: Cycle 9 Day 1 of VRd chemotherapy 02/23/2023: Day 1 Cycle 1 of Dara/Rev/Dex 03/23/2023: Day 1 Cycle 2 of Dara/Rev/Dex 05/06/2023: chemotherapy on pause due to chest pain/cardiac/respiratory issues.  05/18/2023: Cycle 3 Day 1 of Dara/Rev/Dex 06/15/2023: Cycle 4 Day 1 of Dara/Rev/Dex 07/13/2023: Cycle 5 Day 1 of Dara/Rev/Dex 08/10/2023: Cycle 6 Day 1 of Dara/Rev/Dex. Transition to maintenance revlimid .   INTERVAL HISTORY: Zoe [name] 89 y.o. female returns to the clinic today for a follow-up visit accompanied by a friend.   Her last visit was on 08/11/2024. In the interim, she has had no major changes in her health.   Zoe [name] reports she has been well overall in the interim since her last visit.  She reports energy and appetite are overall unchanged. She has some arthritic pain mainly in the knees and lower legs. She continues to use a walker or wheelchair to assist with ambulation. She does have some dizziness mainly in the morning which has improved after  taking her BP meds in the evening. She denies any syncopal episodes. She was established with a dentist and plans to have a crown placed and a filling. She denies nausea, vomiting or bowel habit changes. She denies easy bruising or overt signs of bleeding. She denies fevers, chills, sweats, shortness of breath, chest pain or cough. Rest of the ROS is below.   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  MEDICATIONS:  No current facility-administered medications for this visit.   Current Outpatient Medications  Medication Sig Dispense Refill   acetaminophen  (TYLENOL ) 500 MG tablet Take 1,000 mg by mouth every 6 (six) hours as needed.     acyclovir  (ZOVIRAX ) 400 MG tablet Take 400 mg by mouth 2 (two) times daily.     amiodarone  (PACERONE ) 100 MG tablet Take 50 mg by mouth in the morning.  amLODipine  (NORVASC ) 5 MG tablet TAKE 1 TABLET BY MOUTH AT BEDTIME 90 tablet 3   apixaban  (ELIQUIS ) 5 MG TABS tablet Take 1 tablet (5 mg total) by mouth 2 (two) times daily. 180 tablet 1   Cholecalciferol  25 MCG (1000 UT) TBDP Take 1,000 Units by mouth in the morning.     cyanocobalamin  1000 MCG tablet Take 1,000 mcg by mouth in the morning.     Docusate Sodium  100 MG capsule Take 100 mg by mouth as needed for constipation.     furosemide  (LASIX ) 20 MG tablet Three times a week as  needed; take potassium if taking lasix      lenalidomide  (REVLIMID ) 5 MG capsule Take 1 capsule (5 mg total) by mouth daily. Take 1 capsule daily for 21 days and then none for 7 days. 21 capsule 0   levothyroxine  (SYNTHROID , LEVOTHROID) 100 MCG tablet Take 100 mcg by mouth daily before breakfast.  2   losartan  (COZAAR ) 50 MG tablet Take 1 tablet (50 mg total) by mouth daily. Take at night (Patient taking differently: Take 50 mg by mouth at bedtime.)     metoprolol  succinate (TOPROL -XL) 50 MG 24 hr tablet Take 50 mg by mouth daily. Take with or immediately following a meal. Take in the Morning     Multiple Vitamins-Minerals (PRESERVISION AREDS) CAPS Take 1 capsule by mouth 2 (two) times daily.     oxycodone  (OXY-IR) 5 MG capsule Take 5 mg by mouth every 12 (twelve) hours as needed for pain.     pantoprazole  (PROTONIX ) 40 MG tablet Take 1 tablet (40 mg total) by mouth daily.     polyethylene glycol (MIRALAX  / GLYCOLAX ) 17 g packet Take 17 g by mouth daily.     potassium chloride  SA (KLOR-CON  M) 20 MEQ tablet Three times a week as needed only if taking lasix      rosuvastatin  (CRESTOR ) 20 MG tablet Take 1 tablet (20 mg total) by mouth daily. 90 tablet 3   Facility-Administered Medications Ordered in Other Visits  Medication Dose Route Frequency Provider Last Rate Last Admin   acetaminophen  (TYLENOL ) tablet 500 mg  500 mg Oral Q6H PRN Shona Laurence N, DO       acyclovir  (ZOVIRAX ) tablet 400 mg  400 mg Oral BID Pokhrel, Laxman, MD   400 mg at 10/05/24 2209   amiodarone  (PACERONE ) tablet 50 mg  50 mg Oral q AM Pokhrel, Laxman, MD       amLODipine  (NORVASC ) tablet 5 mg  5 mg Oral QHS Pokhrel, Laxman, MD   5 mg at 10/05/24 2155   apixaban  (ELIQUIS ) tablet 5 mg  5 mg Oral BID Merilee Linsey I, RPH   5 mg at 10/05/24 2155   cholecalciferol  (VITAMIN D3) 25 MCG (1000 UNIT) tablet 1,000 Units  1,000 Units Oral q AM Pokhrel, Laxman, MD       cyanocobalamin  (VITAMIN B12) tablet 1,000 mcg  1,000 mcg Oral q AM  Pokhrel, Laxman, MD       docusate sodium  (COLACE) capsule 100 mg  100 mg Oral Daily Pokhrel, Laxman, MD   100 mg at 10/05/24 1436   ipratropium-albuterol  (DUONEB) 0.5-2.5 (3) MG/3ML nebulizer solution 3 mL  3 mL Nebulization Q2H PRN Shona Laurence N, DO       levothyroxine  (SYNTHROID ) tablet 100 mcg  100 mcg Oral Q0600 Shona Laurence N, DO   100 mcg at 10/06/24 0520   melatonin tablet 5 mg  5 mg Oral QHS PRN Shona Laurence SAILOR, DO  5 mg at 10/06/24 0032   metoprolol  succinate (TOPROL -XL) 24 hr tablet 50 mg  50 mg Oral Daily Pokhrel, Laxman, MD   50 mg at 10/05/24 1436   multivitamin with minerals tablet 1 tablet  1 tablet Oral Daily Pokhrel, Laxman, MD   1 tablet at 10/05/24 1441   oxyCODONE  (Oxy IR/ROXICODONE ) immediate release tablet 5 mg  5 mg Oral Q12H PRN Pokhrel, Laxman, MD       pantoprazole  (PROTONIX ) EC tablet 40 mg  40 mg Oral Daily Pokhrel, Laxman, MD   40 mg at 10/05/24 1436   polyethylene glycol (MIRALAX  / GLYCOLAX ) packet 17 g  17 g Oral Daily PRN Shona Terry SAILOR, DO       prochlorperazine  (COMPAZINE ) injection 5 mg  5 mg Intravenous Q6H PRN Hall, Carole N, DO       rosuvastatin  (CRESTOR ) tablet 20 mg  20 mg Oral Daily Hall, Carole N, DO   20 mg at 10/05/24 1101   torsemide  (DEMADEX ) tablet 20 mg  20 mg Oral Daily Pokhrel, Laxman, MD   20 mg at 10/05/24 1819    SURGICAL HISTORY:  Past Surgical History:  Procedure Laterality Date   ABDOMINAL HYSTERECTOMY  1970's   BACK SURGERY     BREAST LUMPECTOMY  12/1998   lumpectomy   CARDIAC CATHETERIZATION     CARDIOVERSION N/A 04/18/2023   Procedure: CARDIOVERSION;  Surgeon: Michele Richardson, DO;  Location: [facility];  Service: Cardiovascular;  Laterality: N/A;   CARDIOVERSION N/A 04/20/2023   Procedure: CARDIOVERSION;  Surgeon: Ladona Heinz, MD;  Location: [facility];  Service: Cardiovascular;  Laterality: N/A;   EYE SURGERY     cataract surgery bilat    INTRAOPERATIVE TRANSTHORACIC ECHOCARDIOGRAM N/A 09/03/2018   Procedure:  INTRAOPERATIVE TRANSTHORACIC ECHOCARDIOGRAM;  Surgeon: Verlin Lonni BIRCH, MD;  Location: [facility];  Service: Open Heart Surgery;  Laterality: N/A;   KYPHOPLASTY N/A 09/07/2022   Procedure: THORACIC EIGHT KYPHOPLASTY;  Surgeon: Burnetta Aures, MD;  Location: [facility] OR;  Service: Orthopedics;  Laterality: N/A;  1 hr Local with [facility] 3 C-Bed   LITHOTRIPSY     PACEMAKER IMPLANT N/A 01/09/2024   Procedure: PACEMAKER IMPLANT;  Surgeon: Waddell Danelle ORN, MD;  Location: [facility];  Service: Cardiovascular;  Laterality: N/A;   Right total knee     2018 Dr. Ernie   RIGHT/LEFT HEART CATH AND CORONARY ANGIOGRAPHY N/A 08/06/2018   Procedure: RIGHT/LEFT HEART CATH AND CORONARY ANGIOGRAPHY;  Surgeon: Ladona Heinz, MD;  Location: [facility];  Service: Cardiovascular;  Laterality: N/A;   THYROIDECTOMY, PARTIAL  1975   TONSILLECTOMY     as a child - patient not sure of exact date   TOTAL KNEE ARTHROPLASTY Left 03/13/2016   Procedure: TOTAL KNEE ARTHROPLASTY;  Surgeon: Donnice Ernie, MD;  Location: [facility];  Service: Orthopedics;  Laterality: Left;   TOTAL KNEE ARTHROPLASTY Right 06/18/2017   Procedure: RIGHT TOTAL KNEE ARTHROPLASTY;  Surgeon: Ernie Donnice, MD;  Location: [facility];  Service: Orthopedics;  Laterality: Right;   TRANSCATHETER AORTIC VALVE REPLACEMENT, TRANSFEMORAL N/A 09/03/2018   Procedure: TRANSCATHETER AORTIC VALVE REPLACEMENT, TRANSFEMORAL;  Surgeon: Verlin Lonni BIRCH, MD;  Location: [facility];  Service: Open Heart Surgery;  Laterality: N/A;   REVIEW OF SYSTEMS:   Constitutional: ( - ) fevers, ( - )  chills , ( - ) night sweats Eyes: ( - ) blurriness of vision, ( - ) double vision, ( - ) watery eyes Ears, nose, mouth,  throat, and face: ( - ) mucositis, ( - ) sore throat Respiratory: ( -) cough, ( - ) dyspnea, ( - ) wheezes Cardiovascular: ( - ) palpitation, ( - ) chest discomfort, ( +) lower extremity swelling Gastrointestinal:  ( - ) nausea, ( - ) heartburn, ( - ) change  in bowel habits Skin: ( - ) abnormal skin rashes Lymphatics: ( - ) new lymphadenopathy, ( - ) easy bruising Neurological: ( - ) numbness, ( - ) tingling, ( - ) new weaknesses Behavioral/Psych: ( - ) mood change, ( - ) new changes  All other systems were reviewed with the patient and are negative.   PHYSICAL EXAMINATION:  There were no vitals taken for this visit.  ECOG PERFORMANCE STATUS: 1  GENERAL: well appearing elderly Caucasian female, alert, no distress and comfortable SKIN: skin color, texture, turgor are normal, no rashes or significant lesions EYES: conjunctiva are pink and non-injected, sclera clear LUNGS:normal breathing effort.  HEART: irregular rhythm ,normal rate and no murmurs. Bilateral ankle edema.  Musculoskeletal: no cyanosis of digits and no clubbing  PSYCH: alert & oriented x 3, fluent speech NEURO: no focal motor/sensory deficits    LABORATORY DATA: Lab Results  Component Value Date   WBC 5.2 10/05/2024   HGB 8.8 (L) 10/05/2024   HCT 26.0 (L) 10/05/2024   MCV 92.6 10/05/2024   PLT 144 (L) 10/05/2024      Chemistry      Component Value Date/Time   NA 140 10/06/2024 0748   NA 130 (L) 06/19/2022 1012   K 3.9 10/06/2024 0748   CL 102 10/06/2024 0748   CO2 30 10/06/2024 0748   BUN 22 10/06/2024 0748   BUN 21 06/19/2022 1012   CREATININE 1.30 (H) 10/06/2024 0748   CREATININE 1.14 (H) 09/09/2024 1103      Component Value Date/Time   CALCIUM  7.9 (L) 10/06/2024 0748   ALKPHOS 69 10/05/2024 0300   AST 17 10/05/2024 0300   AST 11 (L) 09/09/2024 1103   ALT 10 10/05/2024 0300   ALT 7 09/09/2024 1103   BILITOT 1.3 (H) 10/05/2024 0300   BILITOT 0.8 09/09/2024 1103      / RADIOGRAPHIC STUDIES:  CT Angio Chest Pulmonary Embolism (PE) W or WO Contrast Result Date: 10/05/2024 EXAM: CTA of the Chest with contrast for PE 10/05/2024 04:21:04 AM TECHNIQUE: CTA of the chest was performed without and with the administration of intravenous contrast.  Multiplanar reformatted images are provided for review. MIP images are provided for review. Automated exposure control, iterative reconstruction, and/or weight based adjustment of the mA/kV was utilized to reduce the radiation dose to as low as reasonably achievable. CONTRAST: 75 mL iohexol  (OMNIPAQUE ) 350 MG/ML injection. COMPARISON: CTA chest abdomen and pelvis 08/15/2008, portable chest x ray earlier today, chest CT 01/11/2024. CLINICAL HISTORY: 89 year old female Pulmonary embolism (PE) suspected, high probability. FINDINGS: PULMONARY ARTERIES: Suboptimal but adequate pulmonary artery contrast timing. No pulmonary artery filling defect identified. Distal branches in both the upper lobes and at the lung bases are obscured by mild respiratory motion. Main pulmonary artery is normal in caliber. MEDIASTINUM: Left chest cardiac pacemaker device now in place. Prosthetic aortic valve. Calcified coronary artery atherosclerosis (series 7 image 146) and extensive calcified atherosclerosis of the visible aorta. No pericardial effusion. LYMPH NODES: Reactive appearing mediastinal lymph nodes not significantly changed from 2019. No hilar or axillary lymphadenopathy. LUNGS AND PLEURA: Small bilateral layering pleural effusions with simple fluid density suggesting transudates. Extensive nodular bilateral lung  opacity right greater than left in the upper lobes, superior segments of the lower lobes, middle lobe less affected. Confluent bilateral lower lobe lung opacity appears to reflect a combination of compressive atelectasis and airspace disease. Bilateral lung opacity most compatible with extensive infection, pneumonia. Early consolidation in the lower lobes. Pulmonary edema considered but unlikely. No pneumothorax. UPPER ABDOMEN: Chronic benign anterior right hepatic lobe 7 cm hemangioma, stable in size and configuration since 2019 when it demonstrated typical peripheral puddling enhancement pattern. Chronic mild elevation  of the right hemidiaphragm is stable. Otherwise negative visible mostly non-contrast liver, spleen, pancreas, adrenal glands and bowel in the upper abdomen. SOFT TISSUES AND BONES: Diffuse osteopenia. Chronic thoracic compression fractures, some previously augmented and stable. Healing mid sternal fracture since the previous CT. Healing right 7th and 8th rib fractures. No acute osseous abnormality. No acute soft tissue abnormality. IMPRESSION: 1. No pulmonary embolism identified; suboptimal evaluation due to contrast timing and mild respiratory motion. 2. Widespread bilateral lung opacity most compatible with infection / developing multilobar pneumonia. Small bilateral layering pleural effusions and early consolidation in the lower lobes. Reactive mediastinal lymph nodes. Pulmonary edema was also considered but felt unlikely. 3. Chronic osteopenia and compression fractures. Healing sternal and right rib fractures since March. Electronically signed by: Helayne Hurst MD 10/05/2024 04:40 AM EST RP Workstation: [id number]   DG Chest Portable 1 View Result Date: 10/05/2024 EXAM: 1 VIEW(S) XRAY OF THE CHEST 10/05/2024 03:09:37 AM COMPARISON: 07/24/2024 CLINICAL HISTORY: sob FINDINGS: LINES, TUBES AND DEVICES: Left chest pacemaker with leads overlying right atrium and right ventricle. Aortic valve replacement noted. LUNGS AND PLEURA: Right upper lobe airspace opacity. Chronic coarsened interstitial markings with mild edema. Bilateral trace pleural effusions. No pneumothorax. HEART AND MEDIASTINUM: Cardiomegaly. Atherosclerotic plaque noted. Left chest pacemaker with leads overlying right atrium and right ventricle. Aortic valve replacement noted. BONES AND SOFT TISSUES: Multilevel mid to lower thoracic vertebral augmentation. IMPRESSION: 1. Right upper lobe airspace opacity, suspicious for pneumonia or aspiration. 2. Chronic interstitial lung disease with mild interstitial edema. 3. Bilateral trace pleural effusions.  Electronically signed by: Oneil Devonshire MD 10/05/2024 03:20 AM EST RP Workstation: [id number]      ASSESSMENT/PLAN:  Zoe [name] is a 89 y.o. female who presents to the clinic for evaluation for IgG lambda multiple myeloma.   # IgG Lambda Multiple Myeloma  --diagnosis of MM confirmed with bone marrow biopsy showing 60% plasma cells and anemia -- Bone survey shows no lytic lesions, kidney function is within normal limits.  --08/01/2022 was Cycle 1 Day 1 of VRd  - discontinued velcade  on 02/05/23 due to rash. She is status post day 15 cycle #9.  -changed the care plan to Daratumumab , Revlimid , and decadron . She is scheduled for day 1 cycle #1 today  -Started Cycle 1, Day 1 of Dara/Rev/Dex on 02/23/2023.  Plan:  --Labs from today show WBC ***  --Most recent myeloma labs from 09/09/2024 showed M protein measuring 0.0 g/dL. Kappa light chain overall stable at 58.7, lambda light chain 48.2, Ratio 1.22  -- Continue Revlimid  5 mg daily 21 days of 28-day cycle.  --RTC in 4 weeks for labs and clinic visit and to reevaluate how she is doing on Revlimid .  #LE edema-stable: --Takes lasix  20 mg  daily as needed with potassium supplementation  #Hypotension/BP/Atrial Fibrillation  --Follows with cardiology, Dr. Ladona --Was recently admitted from 01/08/2024 through 01/14/2024 after having syncopal episode and found to have complete heart block.  She underwent pacemaker placement. --Patient's blood pressure  is 133/70 today which is below her baseline. --Advised to check the blood pressure in the morning before taking her antihypertensives and follow-up with Dr. Ladona if her blood pressure is low or she is symptomatic. --Continue on Eliquis  5 mg BID   # Normocytic Anemia #Vitamin B12 Deficiency --Anemia likely driven by multiple myeloma, but may be component of vitamin B12 deficiency as well. --initial labs showed elevated MMA with B12 180 -- Continue vitamin B12 1000 mcg p.o. daily -- Continue to  monitor   # H/O Left lower extremity neuropathy: --Currently on gabapentin  900 mg nightly and 600 mg in the morning --Encouraged to try water  aerobics and stretches.  --Following with Dr. Bobie (ortho) to see if she is a candidate for steroid injections.  -- Patient evaluated by Dr. Buckley thinks that this may be neuropathy worsened by her Velcade  therapy.   #Episode of right UE tremor and speech abnormality: --Evaluated by Dr. Buckley on 03/20/2023. Presumed etiology had been atrial fibrillation, but vascular disease, atheromatous changes, are also possibly implicated concurrently.  --MRI brain from 03/13/2023 show no acute finding including no evidence of intracranial neoplastic disease. There are chronic small-vessel ischemic changes of the pons, cerebral hemispheric white matter and right cerebellum. --Educated patient to seek immediate evaluate if there are repeat episodes. --No neurological deficits identified per exam today  #Supportive Care -- chemotherapy education complete.  -- port placement not required.  -- zofran  8mg  q8H PRN and compazine  10mg  PO q6H for nausea -- acyclovir  400mg  PO BID for VCZ prophylaxis -- plan to complete dental work before starting Zometa therapy.  -- tylenol  1000 mg q8H PRN for back pain.   No orders of the defined types were placed in this encounter.   Patient expressed understanding of the plan provided.   I have spent a total of 30 minutes minutes of face-to-face and non-face-to-face time, preparing to see the patient, performing a medically appropriate examination, counseling and educating the patient, ordering tests/procedures, documenting clinical information in the electronic health record, and care coordination.   Norleen IVAR Kidney, MD Department of Hematology/Oncology [facility] Phone: [phone] Pager: [phone] Email: [email or web address]

## 2024-10-06 NOTE — ED Notes (Signed)
 PT at bedside.

## 2024-10-06 NOTE — Plan of Care (Signed)
 Admitted from Emergency Department.  Alert and oriented.  Continue with plan of care.   Problem: Education: Goal: Knowledge of General Education information will improve Description: Including pain rating scale, medication(s)/side effects and non-pharmacologic comfort measures Outcome: Progressing   Problem: Health Behavior/Discharge Planning: Goal: Ability to manage health-related needs will improve Outcome: Progressing   Problem: Clinical Measurements: Goal: Ability to maintain clinical measurements within normal limits will improve Outcome: Progressing Goal: Will remain free from infection Outcome: Progressing Goal: Diagnostic test results will improve Outcome: Progressing Goal: Respiratory complications will improve Outcome: Progressing Goal: Cardiovascular complication will be avoided Outcome: Progressing   Problem: Activity: Goal: Risk for activity intolerance will decrease Outcome: Progressing   Problem: Nutrition: Goal: Adequate nutrition will be maintained Outcome: Progressing   Problem: Coping: Goal: Level of anxiety will decrease Outcome: Progressing   Problem: Elimination: Goal: Will not experience complications related to bowel motility Outcome: Progressing Goal: Will not experience complications related to urinary retention Outcome: Progressing   Problem: Pain Managment: Goal: General experience of comfort will improve and/or be controlled Outcome: Progressing   Problem: Safety: Goal: Ability to remain free from injury will improve Outcome: Progressing   Problem: Skin Integrity: Goal: Risk for impaired skin integrity will decrease Outcome: Progressing

## 2024-10-06 NOTE — ED Notes (Signed)
 Help get patient straighten up in the bed patient is now sitting up eating breakfast with call bell in reach

## 2024-10-06 NOTE — Evaluation (Signed)
 Occupational Therapy Evaluation Patient Details Name: Zoe [name] MRN: [id number] DOB: [birth year] Today's Date: 10/06/2024   History of Present Illness   89 yo F adm 12/14  SOB, fever, cough, congestion Pt workup reveals L Lung PNA and acute CHF exacerbation PMH HFpEF, CHF, A-fib on Eliquis  and amiodarone , hypothyroidism, HTN, breast CA,multiple myeloma,aortic stenosis status post TAVR, TIA, bil TKA     Clinical Impressions Patient presents with mild shortness and breath and weakness, but is close to her baseline.  OT will continue efforts in the actute setting to address deficits, but no post acute OT is anticipated.       If plan is discharge home, recommend the following:   Assist for transportation     Functional Status Assessment   Patient has had a recent decline in their functional status and demonstrates the ability to make significant improvements in function in a reasonable and predictable amount of time.     Equipment Recommendations   None recommended by OT     Recommendations for Other Services         Precautions/Restrictions   Precautions Precautions: Fall Restrictions Weight Bearing Restrictions Per Provider Order: No     Mobility Bed Mobility Overal bed mobility: Needs Assistance Bed Mobility: Supine to Sit     Supine to sit: Min assist     General bed mobility comments: stretcher Patient Response: Cooperative  Transfers Overall transfer level: Needs assistance   Transfers: Sit to/from Stand, Bed to chair/wheelchair/BSC Sit to Stand: Supervision     Step pivot transfers: Supervision     General transfer comment: line management      Balance Overall balance assessment: Needs assistance Sitting-balance support: Feet supported Sitting balance-Leahy Scale: Good     Standing balance support: Reliant on assistive device for balance Standing balance-Leahy Scale: Fair                             ADL either  performed or assessed with clinical judgement   ADL       Grooming: Supervision/safety               Lower Body Dressing: Supervision/safety   Toilet Transfer: Supervision/safety                   Vision Baseline Vision/History: 1 Wears glasses Patient Visual Report: No change from baseline       Perception Perception: Within Functional Limits       Praxis Praxis: WFL       Pertinent Vitals/Pain Pain Assessment Pain Assessment: Faces Faces Pain Scale: Hurts a little bit Pain Location: R flank Pain Descriptors / Indicators: Aching Pain Intervention(s): Monitored during session     Extremity/Trunk Assessment Upper Extremity Assessment Upper Extremity Assessment: Overall WFL for tasks assessed   Lower Extremity Assessment Lower Extremity Assessment: Defer to PT evaluation   Cervical / Trunk Assessment Cervical / Trunk Assessment: Kyphotic   Communication Communication Communication: No apparent difficulties Factors Affecting Communication: Hearing impaired   Cognition Arousal: Alert Behavior During Therapy: WFL for tasks assessed/performed Cognition: No apparent impairments                                       Cueing  General Comments       VSS on supplemental O2   Exercises     Shoulder Instructions  Home Living Family/patient expects to be discharged to:: Other (Comment)                                 Additional Comments: ILF apartment on the first floor      Prior Functioning/Environment Prior Level of Function : Independent/Modified Independent             Mobility Comments: rollator for ambulation, help for transportation ADLs Comments: mod I ADLs. does some meal prep in her apartment, has one meal in dining room    OT Problem List: Decreased activity tolerance   OT Treatment/Interventions: Self-care/ADL training;Therapeutic activities;Patient/family education;Balance training;DME  and/or AE instruction      OT Goals(Current goals can be found in the care plan section)   Acute Rehab OT Goals Patient Stated Goal: Return home OT Goal Formulation: With patient Time For Goal Achievement: 10/20/24 Potential to Achieve Goals: Good   OT Frequency:  Min 2X/week    Co-evaluation              AM-PAC OT 6 Clicks Daily Activity     Outcome Measure Help from another person eating meals?: None Help from another person taking care of personal grooming?: None Help from another person toileting, which includes using toliet, bedpan, or urinal?: A Little Help from another person bathing (including washing, rinsing, drying)?: A Little Help from another person to put on and taking off regular upper body clothing?: None Help from another person to put on and taking off regular lower body clothing?: A Little 6 Click Score: 21   End of Session Equipment Utilized During Treatment: Rolling walker (2 wheels);Oxygen Nurse Communication: Mobility status  Activity Tolerance: Patient tolerated treatment well Patient left: in bed;with call bell/phone within reach  OT Visit Diagnosis: Unsteadiness on feet (R26.81)                Time: 9099-9080 OT Time Calculation (min): 19 min Charges:  OT General Charges $OT Visit: 1 Visit OT Evaluation $OT Eval Moderate Complexity: 1 Mod  10/06/2024  RP, OTR/L  Acute Rehabilitation Services  Office:  [phone]   Zoe [name] 10/06/2024, 9:33 AM

## 2024-10-07 ENCOUNTER — Inpatient Hospital Stay: Attending: Physician Assistant | Admitting: Hematology and Oncology

## 2024-10-07 ENCOUNTER — Inpatient Hospital Stay

## 2024-10-07 DIAGNOSIS — N179 Acute kidney failure, unspecified: Secondary | ICD-10-CM

## 2024-10-07 DIAGNOSIS — T82857A Stenosis of cardiac prosthetic devices, implants and grafts, initial encounter: Secondary | ICD-10-CM

## 2024-10-07 DIAGNOSIS — N189 Chronic kidney disease, unspecified: Secondary | ICD-10-CM

## 2024-10-07 LAB — BASIC METABOLIC PANEL WITH GFR
Anion gap: 16 — ABNORMAL HIGH (ref 5–15)
BUN: 31 mg/dL — ABNORMAL HIGH (ref 8–23)
CO2: 26 mmol/L (ref 22–32)
Calcium: 8.8 mg/dL — ABNORMAL LOW (ref 8.9–10.3)
Chloride: 95 mmol/L — ABNORMAL LOW (ref 98–111)
Creatinine, Ser: 1.54 mg/dL — ABNORMAL HIGH (ref 0.44–1.00)
GFR, Estimated: 32 mL/min — ABNORMAL LOW (ref >60)
Glucose, Bld: 144 mg/dL — ABNORMAL HIGH (ref 70–99)
Potassium: 4 mmol/L (ref 3.5–5.1)
Sodium: 137 mmol/L (ref 135–145)

## 2024-10-07 LAB — CBC
HCT: 34.7 % — ABNORMAL LOW (ref 36.0–46.0)
Hemoglobin: 11.1 g/dL — ABNORMAL LOW (ref 12.0–15.0)
MCH: 27.9 pg (ref 26.0–34.0)
MCHC: 32 g/dL (ref 30.0–36.0)
MCV: 87.2 fL (ref 80.0–100.0)
Platelets: 226 K/uL (ref 150–400)
RBC: 3.98 MIL/uL (ref 3.87–5.11)
RDW: 17.4 % — ABNORMAL HIGH (ref 11.5–15.5)
WBC: 8.2 K/uL (ref 4.0–10.5)
nRBC: 0 % (ref 0.0–0.2)

## 2024-10-07 LAB — MAGNESIUM: Magnesium: 1.7 mg/dL (ref 1.7–2.4)

## 2024-10-07 MED ORDER — GABAPENTIN 100 MG PO CAPS
100.0000 mg | ORAL_CAPSULE | Freq: Three times a day (TID) | ORAL | Status: DC
  Administered 2024-10-07 – 2024-10-08 (×4): 100 mg via ORAL
  Filled 2024-10-07 (×4): qty 1

## 2024-10-07 MED ORDER — MAGNESIUM OXIDE -MG SUPPLEMENT 400 (240 MG) MG PO TABS
800.0000 mg | ORAL_TABLET | Freq: Once | ORAL | Status: AC
  Administered 2024-10-07: 16:00:00 800 mg via ORAL
  Filled 2024-10-07: qty 2

## 2024-10-07 NOTE — Progress Notes (Signed)
 Mobility Specialist Progress Note:    10/07/24 1103  Mobility  Activity Ambulated with assistance  Level of Assistance Contact guard assist, steadying assist  Assistive Device Other (Comment) (HHA)  Distance Ambulated (ft) 15 ft  Range of Motion/Exercises Active  Activity Response Tolerated fair  Mobility Referral Yes  Mobility visit 1 Mobility  Mobility Specialist Start Time (ACUTE ONLY) 1103  Mobility Specialist Stop Time (ACUTE ONLY) 1117  Mobility Specialist Time Calculation (min) (ACUTE ONLY) 14 min   Received pt sitting in recliner requesting to be placed back in bed. Pt agreeable to ambulate to door and back to EOB. Pt c/o BLE and feet pain told to believed it is neuropathy. Pt able to stand and ambulate w/ CGA. Returned pt to EOB for care from NT and all needs met.   Venetia Keel Mobility Specialist Please Neurosurgeon or Rehab Office at [phone]

## 2024-10-07 NOTE — Progress Notes (Signed)
 Patient Name: Zoe [name]  KATHEE Claude Date of Encounter: 10/07/2024 [facility] Cardiologist: Gordy Bergamo, MD  10/05/2024 .admit Length of stay: 2  Interval Summary  .    Zoe [name]  B Spickler is a 89 y.o. Caucasian female with history of possibly mild coronary artery disease by coronary angiogram in 2014,  severe AS s/p TAVR by Dr. Dusty on 09/03/2018,  essential hypertension, chronic stage 3a CKD, mild hyponatremia probably a combination of chronic diastolic heart failure and medications, hyperlipidemia, hyperglycemia and GERD.  She also has paroxysmal atrial fibrillation and is presently on low-dose amiodarone  and maintaining sinus rhythm.  Now admitted to the hospital with worsening shortness of breath, hypoxemia and presumed community-acquired pneumonia and acute on chronic diastolic heart failure.  Patient feels well this morning except she has noticed leg cramps since being on diuretics.  She is now off of oxygen and is breathing at room air comfortably.  Physical Exam    Vitals:   10/07/24 0039 10/07/24 0615 10/07/24 0750 10/07/24 1118  BP: (!) 138/55 (!) 134/50 (!) 141/52 120/66  Pulse: 75 73 66 68  Resp: 20 18 19 16   Temp: 98 F (36.7 C) 98.2 F (36.8 C) 98.5 F (36.9 C) 98.1 F (36.7 C)  TempSrc: Oral Oral Oral Oral  SpO2: 94% 90% 90% 94%  Weight:  77.8 kg    Height:       Physical Exam Neck:     Vascular: No carotid bruit or JVD.  Cardiovascular:     Rate and Rhythm: Normal rate and regular rhythm.     Pulses: Intact distal pulses.     Heart sounds: Normal heart sounds. No murmur heard.    No gallop.  Pulmonary:     Effort: Pulmonary effort is normal.     Breath sounds: Rhonchi (scattered diffuse soft ronchi) present.  Abdominal:     General: Bowel sounds are normal.     Palpations: Abdomen is soft.  Musculoskeletal:     Right lower leg: No edema.     Left lower leg: No edema.        10/07/2024    6:15 AM 10/06/2024    4:34 PM 10/05/2024    2:57  AM  Last 3 Weights  Weight (lbs) 171 lb 8.3 oz 172 lb 13.5 oz 187 lb 6.3 oz  Weight (kg) 77.8 kg 78.4 kg 85 kg      Labs   Lab Results  Component Value Date   NA 137 10/07/2024   K 4.0 10/07/2024   CO2 26 10/07/2024   GLUCOSE 144 (H) 10/07/2024   BUN 31 (H) 10/07/2024   CREATININE 1.54 (H) 10/07/2024   CALCIUM  8.8 (L) 10/07/2024   EGFR 59 (L) 06/19/2022   GFRNONAA 32 (L) 10/07/2024       Latest Ref Rng & Units 10/07/2024   12:02 PM 10/06/2024    7:48 AM 10/05/2024    6:26 AM  BMP  Glucose 70 - 99 mg/dL 855  895    BUN 8 - 23 mg/dL 31  22    Creatinine 9.55 - 1.00 mg/dL 8.45  8.69    Sodium 864 - 145 mmol/L 137  140  138   Potassium 3.5 - 5.1 mmol/L 4.0  3.9  3.4   Chloride 98 - 111 mmol/L 95  102    CO2 22 - 32 mmol/L 26  30    Calcium  8.9 - 10.3 mg/dL 8.8  7.9         Latest  Ref Rng & Units 10/07/2024   12:02 PM 10/05/2024    6:26 AM 10/05/2024    3:00 AM  CBC  WBC 4.0 - 10.5 K/uL 8.2   5.2   Hemoglobin 12.0 - 15.0 g/dL 88.8  8.8  9.7   Hematocrit 36.0 - 46.0 % 34.7  26.0  31.5   Platelets 150 - 400 K/uL 226   144     Lab Results  Component Value Date   CHOL 159 04/01/2024   HDL 64 04/01/2024   LDLCALC 84 04/01/2024   LDLDIRECT 48 07/16/2019   TRIG 56 04/01/2024   CHOLHDL 2.5 04/01/2024    Lab Results  Component Value Date   TSH 0.891 01/09/2024    Lab Results  Component Value Date   HGBA1C 5.1 03/31/2024    Cardiac Panel (last 3 results) Recent Labs    10/05/24 0300  TROPONINIHS 23*    BNP (last 3 results) Recent Labs    10/05/24 0300  BNP 608.5*     Intake/Output Summary (Last 24 hours) at 10/07/2024 1455 Last data filed at 10/07/2024 9380 Gross per 24 hour  Intake --  Output 600 ml  Net -600 ml    Net IO Since Admission: -1,200 mL [10/07/24 1455]  Tele/EKG/Cardiac studies    Telemetry: 10/08/2023: Atrially paced, ventricularly sensed rhythm.  No atrial fibrillation.  Echocardiogram 10/06/2024:   1. Left ventricular  ejection fraction, by estimation, is 55 to 60%. The left ventricle has normal function. The left ventricle has no regional wall motion abnormalities. There is mild concentric left ventricular hypertrophy. Left ventricular diastolic  parameters are consistent with Grade II diastolic dysfunction (pseudonormalization). Elevated left atrial pressure.  2. Right ventricular systolic function is normal. The right ventricular size is normal. There is mildly elevated pulmonary artery systolic pressure. The estimated right ventricular systolic pressure is 37.3 mmHg.  3. Left atrial size was moderately dilated.  4. The mitral valve is degenerative. Mild mitral valve regurgitation. Mild mitral stenosis. The mean mitral valve gradient is 4.0 mmHg with average heart rate of 65 bpm. Moderate to severe mitral annular calcification.  5. Prothetic valve well seated with calcified leaflet and hemodynamics concerning for prosthetic valve stenosis (PV 3.45 m/sec, MG 28 mmHg, and DVI 0.31). Trivial transvalvular regurgitation. The aortic valve has been repaired/replaced. Aortic valve  regurgitation is trivial. There is a 23 mm Sapien prosthetic (TAVR) valve present in the aortic position. Procedure Date: 09/03/2018.  Comparison(s): Prior images reviewed side by side. Gradient across prosthetic valve is higher, previously peak gradient of 17 and mean gradient of 9 mmHg.  Radiology   CXR and CT reviewed  Current Meds:    Current Medications[1]  Assessment & Plan .     1.  Acute hypoxemic respiratory failure now resolved probably related to acute pulmonary edema and acute on chronic diastolic heart failure. 2.  Acute renal failure on chronic kidney disease stage IIIb.  Cardiorenal syndrome and ATN. 3.  Abnormal chest x-ray, there is suggestion of chronic infiltrates, cannot exclude immuno toxicity as well however rapid improvement in her dyspnea with diuretics suggest probably pulmonary edema.  Will repeat chest x-ray  PA and lateral view tomorrow for comparison and stability. 4.  Prosthetic aortic valve dysfunction with new moderate aortic stenosis.  Recommendations: Continue to hold amiodarone .  Discontinue Lasix  as she is now euvolemic.  Consider Jardiance  for chronic diastolic heart failure and renal protection.  Follow-up chest x-ray tomorrow and if renal function is stable and chest x-rays  improving, can be discharged home with outpatient follow-up. If she continues to have heart failure symptoms or dyspnea, may need to reconsider TAVR for worsening mean gradients across the prosthetic aortic valve, 28 mmHg suggesting moderate pulm hypertension.  DVI is 0.31 and this suggests only mild AAS.  Will continue observation for now.  For questions or updates, please contact  [facility] Please consult [email or web address] for contact info under        Signed,   Gordy Bergamo, MD, [facility] 10/07/2024, 2:55 PM [facility] [address] [place], [place] [address] Phone: [phone]. Fax:  [phone]      [1]  Current Facility-Administered Medications:    acetaminophen  (TYLENOL ) tablet 500 mg, 500 mg, Oral, Q6H PRN, Shona, Carole N, DO   acyclovir  (ZOVIRAX ) tablet 400 mg, 400 mg, Oral, BID, Pokhrel, Laxman, MD, 400 mg at 10/07/24 0935   amLODipine  (NORVASC ) tablet 5 mg, 5 mg, Oral, QHS, Pokhrel, Laxman, MD, 5 mg at 10/06/24 2033   apixaban  (ELIQUIS ) tablet 5 mg, 5 mg, Oral, BID, Merilee Linsey I, RPH, 5 mg at 10/07/24 0935   cholecalciferol  (VITAMIN D3) 25 MCG (1000 UNIT) tablet 1,000 Units, 1,000 Units, Oral, q AM, Pokhrel, Laxman, MD, 1,000 Units at 10/07/24 0606   cyanocobalamin  (VITAMIN B12) tablet 1,000 mcg, 1,000 mcg, Oral, q AM, Pokhrel, Laxman, MD, 1,000 mcg at 10/07/24 0606   docusate sodium  (COLACE) capsule 100 mg, 100 mg, Oral, Daily, Pokhrel, Laxman, MD, 100 mg at 10/07/24 0935   furosemide  (LASIX ) injection 40 mg, 40 mg, Intravenous, Q12H, Ashtyn Freilich, MD, 40 mg at 10/07/24 0341    gabapentin  (NEURONTIN ) capsule 100 mg, 100 mg, Oral, TID, Pokhrel, Laxman, MD, 100 mg at 10/07/24 1204   ipratropium-albuterol  (DUONEB) 0.5-2.5 (3) MG/3ML nebulizer solution 3 mL, 3 mL, Nebulization, Q2H PRN, Hall, Carole N, DO   levothyroxine  (SYNTHROID ) tablet 100 mcg, 100 mcg, Oral, Q0600, Shona Terry SAILOR, DO, 100 mcg at 10/07/24 0607   melatonin tablet 5 mg, 5 mg, Oral, QHS PRN, Shona Terry N, DO, 5 mg at 10/06/24 9967   metoprolol  succinate (TOPROL -XL) 24 hr tablet 50 mg, 50 mg, Oral, Daily, Pokhrel, Laxman, MD, 50 mg at 10/07/24 0935   multivitamin with minerals tablet 1 tablet, 1 tablet, Oral, Daily, Pokhrel, Laxman, MD, 1 tablet at 10/07/24 0935   oxyCODONE  (Oxy IR/ROXICODONE ) immediate release tablet 5 mg, 5 mg, Oral, Q12H PRN, Pokhrel, Laxman, MD, 5 mg at 10/07/24 9060   pantoprazole  (PROTONIX ) EC tablet 40 mg, 40 mg, Oral, Daily, Pokhrel, Laxman, MD, 40 mg at 10/07/24 0935   polyethylene glycol (MIRALAX  / GLYCOLAX ) packet 17 g, 17 g, Oral, Daily PRN, Shona, Carole N, DO   prochlorperazine  (COMPAZINE ) injection 5 mg, 5 mg, Intravenous, Q6H PRN, Hall, Carole N, DO   rosuvastatin  (CRESTOR ) tablet 20 mg, 20 mg, Oral, Daily, Hall, Carole N, DO, 20 mg at 10/07/24 [phone]

## 2024-10-07 NOTE — Plan of Care (Signed)
°  Problem: Health Behavior/Discharge Planning: Goal: Ability to manage health-related needs will improve Outcome: Progressing   Problem: Clinical Measurements: Goal: Ability to maintain clinical measurements within normal limits will improve Outcome: Progressing Goal: Diagnostic test results will improve Outcome: Progressing   Problem: Pain Managment: Goal: General experience of comfort will improve and/or be controlled Outcome: Progressing   Problem: Safety: Goal: Ability to remain free from injury will improve Outcome: Progressing

## 2024-10-07 NOTE — Progress Notes (Addendum)
 PROGRESS NOTE  Zoe  B [name] FMW:[id number] DOB: May 21, 1934 DOA: 10/05/2024 PCP: Gerome Brunet, DO   LOS: 2 days   Brief narrative:  Zoe  B [name] is a 89 y.o. female with past medical history significant for paroxysmal A-fib on Eliquis , chronic HFpEF, hypothyroidism, hypertension, hyperlipidemia, GERD, multiple myeloma on Revlimid , presented to hospital with gradually progressive shortness of breath for 5 days with bilateral lower extremity edema, subjective fever cough and congestion.  In the ED, patient was afebrile but tachypneic and needed supplemental oxygen..  Chest x-ray showed right upper lobe airspace opacity suspicious for pneumonia or aspiration with chronic interstitial findings.  Had bilateral trace pleural effusion as well.  Labs were notable for BNP more than 600.  In the ED patient was given IV Lasix , empiric IV antibiotics with, Rocephin  and azithromycin  and was admitted hospital for further evaluation and treatment.     Assessment/Plan: Principal Problem:   Acute on chronic diastolic (congestive) heart failure (HCC) Active Problems:   Community acquired pneumonia   Bilateral pulmonary infiltrates on CXR   High risk medication use   Hypoxemic respiratory failure, chronic (HCC)  Acute on chronic HFpEF History of severe AS status post TAVR in the past.  BNP more than 600.  Was volume overloaded on presentation.  Continue strict intake output charting and daily weights.  Seen by cardiology and currently on IV Lasix  with good diuresis.  Has been taken off amiodarone  due to concerns for possible interstitial pulmonary changes.  Currently on room air.  Blood cultures negative in 2 days.  Off antibiotic.  COVID influenza and RSV was negative as well.   Paroxysmal A-fib on Eliquis  Rate controlled at this time.  Continue Eliquis .  Has been taken off amiodarone  and started on metoprolol  by cardiology.   Chronic normocytic anemia likely secondary to multiple myeloma Last  hemoglobin of 11.1 from 9.7 Patient follows up with oncology as outpatient.   Hypokalemia Improved after replacement.  Latest potassium of 4.2 magnesium  1.7   Multiple myeloma Generalized weakness Ambulates with the help of walker at home.  PT OT recommends home health on discharge.   Hyperlipidemia Continue statin   Hypothyroidism Continue Synthroid   Bilateral lower extremity tingling numbness.  Could be peripheral neuropathy.  Will try low-dose gabapentin  100 mg 3 times daily for now.  Class I obesity. Body mass index is 30.38 kg/m.  Would benefit from lifestyle modification as outpatient   DVT prophylaxis:  apixaban  (ELIQUIS ) tablet 5 mg   Disposition: Home with home health likely in 1 to 2 days  Status is: Inpatient Remains inpatient appropriate because: Pending clinical improvement, IV diuresis    Code Status:     Code Status: Full Code  Family Communication: None at bedside. Spoke with son and updated him about the clinical condition of the patient.   Consultants: Cardiology  Procedures: None  Anti-infectives:  Acyclovir   Anti-infectives (From admission, onward)    Start     Dose/Rate Route Frequency Ordered Stop   10/05/24 1430  acyclovir  (ZOVIRAX ) tablet 400 mg        400 mg Oral 2 times daily 10/05/24 1415     10/05/24 0800  azithromycin  (ZITHROMAX ) 500 mg in sodium chloride  0.9 % 250 mL IVPB        500 mg 250 mL/hr over 60 Minutes Intravenous  Once 10/05/24 0751 10/05/24 0946   10/05/24 0800  cefTRIAXone  (ROCEPHIN ) 1 g in sodium chloride  0.9 % 100 mL IVPB        1  g 200 mL/hr over 30 Minutes Intravenous  Once 10/05/24 0751 10/05/24 0838   10/05/24 0515  cefTRIAXone  (ROCEPHIN ) 1 g in sodium chloride  0.9 % 100 mL IVPB  Status:  Discontinued        1 g 200 mL/hr over 30 Minutes Intravenous  Once 10/05/24 0514 10/05/24 0752   10/05/24 0515  azithromycin  (ZITHROMAX ) 500 mg in sodium chloride  0.9 % 250 mL IVPB  Status:  Discontinued        500 mg 250  mL/hr over 60 Minutes Intravenous  Once 10/05/24 0514 10/05/24 0752        Subjective: Today, patient was seen and examined at bedside.  Patient patient complains of burning and pain over the bilateral lower extremities and legs.  Shortness of breath has improved denies any chest pain fever chills or rigor.    Objective: Vitals:   10/07/24 0750 10/07/24 1118  BP: (!) 141/52 120/66  Pulse: 66 68  Resp: 19 16  Temp: 98.5 F (36.9 C) 98.1 F (36.7 C)  SpO2: 90% 94%    Intake/Output Summary (Last 24 hours) at 10/07/2024 1330 Last data filed at 10/07/2024 9380 Gross per 24 hour  Intake --  Output 600 ml  Net -600 ml   Filed Weights   10/05/24 0257 10/06/24 1634 10/07/24 0615  Weight: 85 kg 78.4 kg 77.8 kg   Body mass index is 30.38 kg/m.   Physical Exam: General: Obese built, not in obvious distress, elderly female, HENT:   No scleral pallor or icterus noted. Oral mucosa is moist.  Chest:  Diminished breath sounds bilaterally.  CVS: S1 &S2 heard. No murmur.  Regular rate and rhythm. Abdomen: Soft, nontender, nondistended.  Bowel sounds are heard.   Extremities: No cyanosis, clubbing with bilateral lower extremity edema..  Peripheral pulses are palpable. Psych: Alert, awake and oriented, normal mood CNS:  No cranial nerve deficits.  Power equal in all extremities.   Skin: Warm and dry.  No rashes noted.  Data Review: I have personally reviewed the following laboratory data and studies,  CBC: Recent Labs  Lab 10/05/24 0300 10/05/24 0626 10/07/24 1202  WBC 5.2  --  8.2  NEUTROABS 3.3  --   --   HGB 9.7* 8.8* 11.1*  HCT 31.5* 26.0* 34.7*  MCV 92.6  --  87.2  PLT 144*  --  226   Basic Metabolic Panel: Recent Labs  Lab 10/05/24 0300 10/05/24 0626 10/06/24 0748 10/07/24 1202  NA 137 138 140 137  K 4.2 3.4* 3.9 4.0  CL 105  --  102 95*  CO2 21*  --  30 26  GLUCOSE 105*  --  104* 144*  BUN 20  --  22 31*  CREATININE 1.16*  --  1.30* 1.54*  CALCIUM  8.1*  --   7.9* 8.8*  MG  --   --  1.7 1.7  PHOS  --   --  4.5  --    Liver Function Tests: Recent Labs  Lab 10/05/24 0300  AST 17  ALT 10  ALKPHOS 69  BILITOT 1.3*  PROT 6.0*  ALBUMIN 2.7*   No results for input(s): LIPASE, AMYLASE in the last 168 hours. No results for input(s): AMMONIA in the last 168 hours. Cardiac Enzymes: No results for input(s): CKTOTAL, CKMB, CKMBINDEX, TROPONINI in the last 168 hours. BNP (last 3 results) Recent Labs    10/05/24 0300  BNP 608.5*    ProBNP (last 3 results) No results for input(s): PROBNP in the  last 8760 hours.  CBG: No results for input(s): GLUCAP in the last 168 hours. Recent Results (from the past 240 hours)  Blood culture (routine x 2)     Status: None (Preliminary result)   Collection Time: 10/05/24  5:20 AM   Specimen: BLOOD RIGHT ARM  Result Value Ref Range Status   Specimen Description BLOOD RIGHT ARM  Final   Special Requests   Final    BOTTLES DRAWN AEROBIC AND ANAEROBIC Blood Culture adequate volume   Culture   Final    NO GROWTH 2 DAYS Performed at [facility], [address] [place], [place] [address]    Report Status PENDING  Incomplete  Blood culture (routine x 2)     Status: None (Preliminary result)   Collection Time: 10/05/24  6:16 AM   Specimen: BLOOD  Result Value Ref Range Status   Specimen Description BLOOD BLOOD RIGHT HAND  Final   Special Requests   Final    BOTTLES DRAWN AEROBIC AND ANAEROBIC Blood Culture adequate volume   Culture   Final    NO GROWTH 2 DAYS Performed at [facility], [address] [place], [place] [address]    Report Status PENDING  Incomplete  Resp panel by RT-PCR (RSV, Flu A&B, Covid) Anterior Nasal Swab     Status: None   Collection Time: 10/05/24  7:38 AM   Specimen: Anterior Nasal Swab  Result Value Ref Range Status   SARS Coronavirus 2 by RT PCR NEGATIVE NEGATIVE Final   Influenza A by PCR NEGATIVE NEGATIVE Final   Influenza B by PCR NEGATIVE  NEGATIVE Final    Comment: (NOTE) The Xpert Xpress SARS-CoV-2/FLU/RSV plus assay is intended as an aid in the diagnosis of influenza from Nasopharyngeal swab specimens and should not be used as a sole basis for treatment. Nasal washings and aspirates are unacceptable for Xpert Xpress SARS-CoV-2/FLU/RSV testing.  Fact Sheet for Patients: bloggercourse.com  Fact Sheet for Healthcare Providers: seriousbroker.it  This test is not yet approved or cleared by the [place]  FDA and has been authorized for detection and/or diagnosis of SARS-CoV-2 by FDA under an Emergency Use Authorization (EUA). This EUA will remain in effect (meaning this test can be used) for the duration of the COVID-19 declaration under Section 564(b)(1) of the Act, 21 U.S.C. section 360bbb-3(b)(1), unless the authorization is terminated or revoked.     Resp Syncytial Virus by PCR NEGATIVE NEGATIVE Final    Comment: (NOTE) Fact Sheet for Patients: bloggercourse.com  Fact Sheet for Healthcare Providers: seriousbroker.it  This test is not yet approved or cleared by the [place]  FDA and has been authorized for detection and/or diagnosis of SARS-CoV-2 by FDA under an Emergency Use Authorization (EUA). This EUA will remain in effect (meaning this test can be used) for the duration of the COVID-19 declaration under Section 564(b)(1) of the Act, 21 U.S.C. section 360bbb-3(b)(1), unless the authorization is terminated or revoked.  Performed at [facility], [address], [place], [place] [address]      Studies: ECHOCARDIOGRAM COMPLETE Result Date: 10/06/2024    ECHOCARDIOGRAM REPORT   Patient Name:   Zoe  JAMIAH [name] Date of Exam: 10/06/2024 Medical Rec #:  [id number]           Height:       64.0 in Accession #:    [id number]          Weight:       187.4 lb Date of Birth:  Jun 06, 1934            BSA:           1.903 m Patient Age:    89 years            BP:           114/47 mmHg Patient Gender: F                   HR:           70 bpm. Exam Location:  Inpatient Procedure: 2D Echo, Cardiac Doppler and Color Doppler (Both Spectral and Color            Flow Doppler were utilized during procedure). Indications:    ACHF- acute diastolic  History:        Patient has prior history of Echocardiogram examinations, most                 recent 01/09/2024. CHF, Pacemaker, Arrythmias:Atrial                 Fibrillation; Risk Factors:Hypertension and Dyslipidemia.                 Aortic Valve: 23 mm Sapien prosthetic, stented (TAVR) valve is                 present in the aortic position. Procedure Date: 09/03/2018.  Sonographer:    Merlynn Argyle Referring Phys: [id number] CAROLE N HALL IMPRESSIONS  1. Left ventricular ejection fraction, by estimation, is 55 to 60%. The left ventricle has normal function. The left ventricle has no regional wall motion abnormalities. There is mild concentric left ventricular hypertrophy. Left ventricular diastolic parameters are consistent with Grade II diastolic dysfunction (pseudonormalization). Elevated left atrial pressure.  2. Right ventricular systolic function is normal. The right ventricular size is normal. There is mildly elevated pulmonary artery systolic pressure. The estimated right ventricular systolic pressure is 37.3 mmHg.  3. Left atrial size was moderately dilated.  4. The mitral valve is degenerative. Mild mitral valve regurgitation. Mild mitral stenosis. The mean mitral valve gradient is 4.0 mmHg with average heart rate of 65 bpm. Moderate to severe mitral annular calcification.  5. Prothetic valve well seated with calcified leaflet and hemodynamics concerning for prosthetic valve stenosis (PV 3.45 m/sec, MG 28 mmHg, and DVI 0.31). Trivial transvalvular regurgitation. The aortic valve has been repaired/replaced. Aortic valve regurgitation is trivial. There is a 23 mm Sapien prosthetic  (TAVR) valve present in the aortic position. Procedure Date: 09/03/2018. Comparison(s): Prior images reviewed side by side. Gradient across prosthetic valve is higher. FINDINGS  Left Ventricle: Left ventricular ejection fraction, by estimation, is 55 to 60%. The left ventricle has normal function. The left ventricle has no regional wall motion abnormalities. The left ventricular internal cavity size was normal in size. There is  mild concentric left ventricular hypertrophy. Left ventricular diastolic function could not be evaluated due to mitral annular calcification (moderate or greater). Left ventricular diastolic parameters are consistent with Grade II diastolic dysfunction (pseudonormalization). Elevated left atrial pressure. Right Ventricle: The right ventricular size is normal. No increase in right ventricular wall thickness. Right ventricular systolic function is normal. There is mildly elevated pulmonary artery systolic pressure. The tricuspid regurgitant velocity is 2.93  m/s, and with an assumed right atrial pressure of 3 mmHg, the estimated right ventricular systolic pressure is 37.3 mmHg. Left Atrium: Left atrial size was moderately dilated. Right Atrium: Right atrial size was normal in size. Pericardium:  There is no evidence of pericardial effusion. Mitral Valve: The mitral valve is degenerative in appearance. Moderate to severe mitral annular calcification. Mild mitral valve regurgitation. Mild mitral valve stenosis. The mean mitral valve gradient is 4.0 mmHg with average heart rate of 65 bpm. Tricuspid Valve: The tricuspid valve is normal in structure. Tricuspid valve regurgitation is not demonstrated. No evidence of tricuspid stenosis. Aortic Valve: Prothetic valve well seated with calcified leaflet and hemodynamics concerning for prosthetic valve stenosis (PV 3.45 m/sec, MG 28 mmHg, and DVI 0.31). Trivial transvalvular regurgitation. The aortic valve has been repaired/replaced. Aortic  valve  regurgitation is trivial. Aortic valve mean gradient measures 24.0 mmHg. Aortic valve peak gradient measures 40.5 mmHg. Aortic valve area, by VTI measures 0.76 cm. There is a 23 mm Sapien prosthetic, stented (TAVR) valve present in the aortic position. Procedure Date: 09/03/2018. Pulmonic Valve: The pulmonic valve was not well visualized. Pulmonic valve regurgitation is not visualized. Aorta: The aortic root and ascending aorta are structurally normal, with no evidence of dilitation. IAS/Shunts: No atrial level shunt detected by color flow Doppler.  LEFT VENTRICLE PLAX 2D LVIDd:         4.10 cm   Diastology LVIDs:         2.90 cm   LV e' medial:    4.13 cm/s LV PW:         1.30 cm   LV E/e' medial:  35.6 LV IVS:        1.30 cm   LV e' lateral:   8.33 cm/s LVOT diam:     1.70 cm   LV E/e' lateral: 17.6 LV SV:         55 LV SV Index:   29 LVOT Area:     2.27 cm  RIGHT VENTRICLE             IVC RV Basal diam:  3.40 cm     IVC diam: 1.40 cm RV S prime:     11.50 cm/s TAPSE (M-mode): 1.9 cm LEFT ATRIUM              Index        RIGHT ATRIUM           Index LA diam:        4.70 cm  2.47 cm/m   RA Area:     12.30 cm LA Vol (A2C):   106.0 ml 55.70 ml/m  RA Volume:   22.50 ml  11.82 ml/m LA Vol (A4C):   78.5 ml  41.25 ml/m LA Biplane Vol: 91.2 ml  47.93 ml/m  AORTIC VALVE AV Area (Vmax):    0.65 cm AV Area (Vmean):   0.67 cm AV Area (VTI):     0.76 cm AV Vmax:           318.33 cm/s AV Vmean:          219.200 cm/s AV VTI:            0.730 m AV Peak Grad:      40.5 mmHg AV Mean Grad:      24.0 mmHg LVOT Vmax:         90.70 cm/s LVOT Vmean:        64.867 cm/s LVOT VTI:          0.244 m LVOT/AV VTI ratio: 0.33  AORTA Ao Root diam: 3.00 cm Ao Asc diam:  3.00 cm MITRAL VALVE  TRICUSPID VALVE MV Area (PHT): 3.34 cm     TR Peak grad:   34.3 mmHg MV Mean grad:  4.0 mmHg     TR Vmax:        293.00 cm/s MV Decel Time: 227 msec MV E velocity: 147.00 cm/s  SHUNTS MV A velocity: 114.00 cm/s  Systemic VTI:  0.24 m  MV E/A ratio:  1.29         Systemic Diam: 1.70 cm Joelle Cedars Tonleu Electronically signed by Joelle Cedars Ny Signature Date/Time: 10/06/2024/6:07:11 PM    Final       Vernal Alstrom, MD  [facility] 10/07/2024  If 7PM-7AM, please contact night-coverage

## 2024-10-07 NOTE — Progress Notes (Signed)
 SATURATION QUALIFICATIONS: (This note is used to comply with regulatory documentation for home oxygen)  Patient Saturations on Room Air at Rest = 92%  Patient Saturations on Room Air while Ambulating = 84%  Patient Saturations on 3 Liters of oxygen while Ambulating = 88%  Please briefly explain why patient needs home oxygen:Requires 3LO2 with activity to keep sats > 88%.   Lana Flaim M,PT Acute Rehab Services [phone]

## 2024-10-07 NOTE — Progress Notes (Signed)
 Physical Therapy Treatment Patient Details Name: Zoe [name] MRN: [id number] DOB: 20-Apr-1934 Today's Date: 10/07/2024   History of Present Illness 89 yo F adm 12/14 with SOB, fever, cough, congestion Pt workup reveals L Lung PNA and acute CHF exacerbation. PMH HFpEF, CHF, A-fib on Eliquis  and amiodarone , hypothyroidism, HTN, breast CA,multiple myeloma,aortic stenosis status post TAVR, TIA, bil TKA    PT Comments  Pt admitted with above diagnosis. Pt was CGA for ambulation and needed rollator.  Cues for pursed lip breathing as pt does desaturate without O2.  Needs 3LO2 to keep sats > 88%.  Pt adamant that she can care for herself at home and has aides multiple x throughout the day.  Desires home with HHPT.  Will continue to follow acutely.  Pt currently with functional limitations due to the deficits listed below (see PT Problem List). Pt will benefit from acute skilled PT to increase their independence and safety with mobility to allow discharge.       If plan is discharge home, recommend the following: A little help with walking and/or transfers;A little help with bathing/dressing/bathroom;Assist for transportation;Help with stairs or ramp for entrance   Can travel by private vehicle        Equipment Recommendations  None recommended by PT    Recommendations for Other Services       Precautions / Restrictions Precautions Precautions: Fall Restrictions Weight Bearing Restrictions Per Provider Order: No     Mobility  Bed Mobility Overal bed mobility: Needs Assistance Bed Mobility: Supine to Sit     Supine to sit: Contact guard     General bed mobility comments: No physical assist.    Transfers Overall transfer level: Needs assistance Equipment used: Rolling walker (2 wheels) Transfers: Sit to/from Stand Sit to Stand: Min assist   Step pivot transfers: Supervision       General transfer comment: CGA but min cues as she takes several attempts using momentum as  well as needed cues for hand placement.    Ambulation/Gait Ambulation/Gait assistance: Supervision Gait Distance (Feet): 50 Feet (50 feet x 2) Assistive device: Rollator (4 wheels) Gait Pattern/deviations: Trunk flexed, Decreased stride length, Step-through pattern, Wide base of support Gait velocity: decreased Gait velocity interpretation: <1.31 ft/sec, indicative of household ambulator   General Gait Details: Sats 94% on Arrival to room with pt on RA.  Pt with slow cautious gait pattern. No LOB noted however did desaturate on RA needing 3L to keep sats > 88%.  Pt had to have a seated rest break on rollator.  Occasional cues for pursed lip breathing.  Reviewed incentive spirometer use prior to departure. Placed back on RA at end of session and sats 92%.   Stairs             Wheelchair Mobility     Tilt Bed    Modified Rankin (Stroke Patients Only)       Balance Overall balance assessment: Needs assistance Sitting-balance support: Feet supported Sitting balance-Leahy Scale: Good     Standing balance support: Reliant on assistive device for balance, Bilateral upper extremity supported, During functional activity Standing balance-Leahy Scale: Poor Standing balance comment: relies on rollator                            Communication Communication Communication: No apparent difficulties Factors Affecting Communication: Hearing impaired  Cognition Arousal: Alert Behavior During Therapy: WFL for tasks assessed/performed   PT - Cognitive impairments: No  apparent impairments                         Following commands: Intact      Cueing Cueing Techniques: Verbal cues, Tactile cues  Exercises General Exercises - Lower Extremity Ankle Circles/Pumps: AROM, 5 reps, Both, Supine Long Arc Quad: AROM, Both, 10 reps, Seated Hip Flexion/Marching: AROM, Both, 10 reps, Seated Other Exercises Other Exercises: reviewed pursed lip breathing    General  Comments        Pertinent Vitals/Pain Pain Assessment Pain Assessment: Faces Faces Pain Scale: Hurts a little bit Pain Location: R flank Pain Descriptors / Indicators: Aching Pain Intervention(s): Monitored during session, Limited activity within patient's tolerance, Repositioned    Home Living                          Prior Function            PT Goals (current goals can now be found in the care plan section) Acute Rehab PT Goals Patient Stated Goal: to get better Progress towards PT goals: Progressing toward goals    Frequency    Min 2X/week      PT Plan      Co-evaluation              AM-PAC PT 6 Clicks Mobility   Outcome Measure  Help needed turning from your back to your side while in a flat bed without using bedrails?: A Little Help needed moving from lying on your back to sitting on the side of a flat bed without using bedrails?: A Little Help needed moving to and from a bed to a chair (including a wheelchair)?: A Little Help needed standing up from a chair using your arms (e.g., wheelchair or bedside chair)?: A Little Help needed to walk in hospital room?: A Little Help needed climbing 3-5 steps with a railing? : A Little 6 Click Score: 18    End of Session Equipment Utilized During Treatment: Gait belt;Oxygen Activity Tolerance: Patient tolerated treatment well Patient left: with call bell/phone within reach;in chair;with chair alarm set Nurse Communication: Mobility status PT Visit Diagnosis: Other abnormalities of gait and mobility (R26.89)     Time: 9182-9147 PT Time Calculation (min) (ACUTE ONLY): 35 min  Charges:    $Gait Training: 8-22 mins $Therapeutic Exercise: 8-22 mins PT General Charges $$ ACUTE PT VISIT: 1 Visit                     Marimar Suber M,PT Acute Rehab Services [phone]    Stephane JULIANNA Bevel 10/07/2024, 10:15 AM

## 2024-10-07 NOTE — TOC Initial Note (Addendum)
 Transition of Care [facility]) - Initial/Assessment Note    Patient Details  Name: Zoe [name] MRN: [id number] Date of Birth: 1934-05-13  Transition of Care [facility]) CM/SW Contact:    Waddell Barnie Rama, RN Phone Number: 10/07/2024, 12:54 PM  Clinical Narrative:                 From [facility],  has PCP and insurance on file, states has no HH services in place at this time  but has aides from 7:30 to 8:45 pm that helps with medications, does safety checks, and gets her ready for bed, has walker and shower chair at home.  States family member  (son and DIL) will transport them home at [employer] and family is support system, states [facility] [address].  Pta self ambulatory with walker.   Per PT eval rec HHPT, she states she would like to do Physical therapy onsite at [facility].  NCM will fax orders to [facility].  NCM gave patient brochure for [facility] also.  [phone]- NCM received call from [facility], their fax is [phone], NCM faxed orders to [facility].    Expected Discharge Plan: Home w Home Health Services Barriers to Discharge: Continued Medical Work up   Patient Goals and CMS Choice Patient states their goals for this hospitalization and ongoing recovery are:: return to [facility] CMS Medicare.gov Compare Post Acute Care list provided to:: Patient Represenative (must comment) Choice offered to / list presented to : Patient      Expected Discharge Plan and Services In-house Referral: NA Discharge Planning Services: CM Consult Post Acute Care Choice: Home Health Living arrangements for the past 2 months: Independent Living Facility                 DME Arranged: N/A DME Agency: NA       HH Arranged: PT HH Agency:  International Aid/development Worker at [employer])        Prior Living Arrangements/Services Living arrangements for the past 2 months: Independent Living Facility Lives with:: Self Patient language and need for interpreter reviewed:: Yes Do you feel safe going back to  the place where you live?: Yes      Need for Family Participation in Patient Care: Yes (Comment) Care giver support system in place?: Yes (comment) Current home services: DME, Homehealth aide (walker , shower chair) Criminal Activity/Legal Involvement Pertinent to Current Situation/Hospitalization: No - Comment as needed  Activities of Daily Living   ADL Screening (condition at time of admission) Independently performs ADLs?: Yes (appropriate for developmental age) Is the patient deaf or have difficulty hearing?: No Does the patient have difficulty seeing, even when wearing glasses/contacts?: No Does the patient have difficulty concentrating, remembering, or making decisions?: No  Permission Sought/Granted Permission sought to share information with : Case Manager Permission granted to share information with : Yes, Verbal Permission Granted     Permission granted to share info w AGENCY: [facility]        Emotional Assessment Appearance:: Appears stated age Attitude/Demeanor/Rapport: Engaged Affect (typically observed): Appropriate Orientation: : Oriented to Self, Oriented to Place, Oriented to  Time, Oriented to Situation Alcohol  / Substance Use: Not Applicable Psych Involvement: No (comment)  Admission diagnosis:  Acute on chronic diastolic (congestive) heart failure (HCC) [I50.33] Community acquired pneumonia of left lung, unspecified part of lung [J18.9] Acute congestive heart failure, unspecified heart failure type (HCC) [I50.9] Patient Active Problem List   Diagnosis Date Noted   Community acquired pneumonia 10/06/2024  Bilateral pulmonary infiltrates on CXR 10/06/2024   High risk medication use 10/06/2024   Hypoxemic respiratory failure, chronic (HCC) 10/06/2024   Acute on chronic diastolic (congestive) heart failure (HCC) 10/05/2024   Vertebral compression fracture (HCC) 04/01/2024   Dysarthria 03/30/2024   CKD (chronic kidney disease) stage 3, GFR 30-59 ml/min (HCC)  01/09/2024   Heart block AV complete (HCC) 01/09/2024   Syncope 01/08/2024   Chronic anticoagulation 10/02/2023   Vasospastic angina 10/01/2023   History of coronary vasospasm 10/01/2023   NSTEMI (non-ST elevated myocardial infarction) (HCC) 10/01/2023   Hypertensive urgency 07/16/2023   Urinary tract infection due to Klebsiella species 07/16/2023   Chronic heart failure with preserved ejection fraction (HFpEF) (HCC) 07/16/2023   Hypokalemia 07/16/2023   Hypocalcemia 07/16/2023   Atypical atrial flutter (HCC) 04/19/2023   Acute on chronic diastolic heart failure (HCC) 04/19/2023   Paroxysmal atrial fibrillation with rapid ventricular response (HCC) 04/18/2023   History of stroke 04/16/2023   Fall at home, initial encounter 04/16/2023   Atrial fib/flutter, transient (HCC) 04/16/2023   Paroxysmal atrial fibrillation (HCC) 04/15/2023   Elevated troponin 04/15/2023   Chest pain 04/13/2023   Stroke due to embolism (HCC) 03/20/2023   Chest pain, rule out acute myocardial infarction 02/17/2023   Chemotherapy-induced neuropathy 11/28/2022   Pain due to onychomycosis of toenails of both feet 11/08/2022   Multiple myeloma (HCC) 07/24/2022   Chronic diastolic CHF (congestive heart failure) (HCC) 05/16/2022   Symptomatic anemia 05/16/2022   Hyponatremia 04/02/2022   Microcytic anemia 04/02/2022   S/P TAVR (transcatheter aortic valve replacement) 09/03/2018   Hypothyroidism    Hypertension    Hyperlipidemia    GERD (gastroesophageal reflux disease)    S/P right TKA 06/18/2017   Class 1 obesity 03/15/2016   S/P left TKA 03/13/2016   History of breast cancer, DCIS, lumpectomy March 2000 12/13/2011   PCP:  Gerome Brunet, DO Pharmacy:   [id number] - [place], [place] - [address] AT [facility] [address] [place] [address] Phone: [phone] Fax: [phone]  [facility] [place], [place] - [address] [facility] [address] [place] [address] Phone: [phone] Fax: [phone]  [facility] - [place], [place] - [place] [address] [place] [address] Phone: [phone] Fax: [phone]     Social Drivers of Health (SDOH) Social History: SDOH Screenings   Food Insecurity: No Food Insecurity (10/06/2024)  Housing: Low Risk (10/06/2024)  Transportation Needs: No Transportation Needs (10/06/2024)  Utilities: Not At Risk (10/06/2024)  Depression (PHQ2-9): Low Risk (08/11/2024)  Social Connections: Moderately Integrated (10/06/2024)  Tobacco Use: Low Risk (10/05/2024)   SDOH Interventions:     Readmission Risk Interventions    10/07/2024   12:51 PM  Readmission Risk Prevention Plan  Transportation Screening Complete  Medication Review (RN Care Manager) Complete  HRI or Home Care Consult Complete  Palliative Care Screening Not Applicable  Skilled Nursing Facility Not Applicable

## 2024-10-08 ENCOUNTER — Other Ambulatory Visit (HOSPITAL_COMMUNITY): Payer: Self-pay

## 2024-10-08 ENCOUNTER — Inpatient Hospital Stay (HOSPITAL_COMMUNITY)

## 2024-10-08 ENCOUNTER — Telehealth (HOSPITAL_COMMUNITY): Payer: Self-pay

## 2024-10-08 DIAGNOSIS — I5033 Acute on chronic diastolic (congestive) heart failure: Secondary | ICD-10-CM | POA: Diagnosis not present

## 2024-10-08 LAB — BASIC METABOLIC PANEL WITH GFR
Anion gap: 12 (ref 5–15)
BUN: 35 mg/dL — ABNORMAL HIGH (ref 8–23)
CO2: 27 mmol/L (ref 22–32)
Calcium: 8.5 mg/dL — ABNORMAL LOW (ref 8.9–10.3)
Chloride: 96 mmol/L — ABNORMAL LOW (ref 98–111)
Creatinine, Ser: 1.49 mg/dL — ABNORMAL HIGH (ref 0.44–1.00)
GFR, Estimated: 33 mL/min — ABNORMAL LOW (ref >60)
Glucose, Bld: 140 mg/dL — ABNORMAL HIGH (ref 70–99)
Potassium: 3.6 mmol/L (ref 3.5–5.1)
Sodium: 135 mmol/L (ref 135–145)

## 2024-10-08 LAB — PRO BRAIN NATRIURETIC PEPTIDE: Pro Brain Natriuretic Peptide: 587 pg/mL — ABNORMAL HIGH (ref <300.0)

## 2024-10-08 MED ORDER — EMPAGLIFLOZIN 10 MG PO TABS
10.0000 mg | ORAL_TABLET | Freq: Every day | ORAL | 1 refills | Status: DC
  Filled 2024-10-08: qty 30, 30d supply, fill #0

## 2024-10-08 MED ORDER — EMPAGLIFLOZIN 10 MG PO TABS
10.0000 mg | ORAL_TABLET | Freq: Every day | ORAL | Status: DC
  Administered 2024-10-08: 13:00:00 10 mg via ORAL
  Filled 2024-10-08: qty 1

## 2024-10-08 MED ORDER — GABAPENTIN 100 MG PO CAPS
100.0000 mg | ORAL_CAPSULE | Freq: Three times a day (TID) | ORAL | 0 refills | Status: AC
  Filled 2024-10-08: qty 90, 30d supply, fill #0

## 2024-10-08 NOTE — Care Management Important Message (Signed)
 Important Message  Patient Details  Name: Zoe [name] MRN: [id number] Date of Birth: [birth year]   Important Message Given:  Yes - Medicare IM     Zoe [name] 10/08/2024, 11:48 AM

## 2024-10-08 NOTE — Discharge Summary (Signed)
 Physician Discharge Summary  Zoe [name] FMW:[id number] DOB: 01-06-1934 DOA: 10/05/2024  PCP: Zoe Brunet, DO  Admit date: 10/05/2024 Discharge date: 10/08/2024  Admitted From: Home  Discharge disposition: Home with home health   Recommendations for Outpatient Follow-Up:   Follow up with your primary care provider in one week.  Check CBC, BMP, magnesium  in the next visit Follow-up with cardiology Dr. Margaretann as outpatient as scheduled by the clinic. Follow-up with oncology as scheduled by the clinic   Discharge Diagnosis:   Principal Problem:   Acute on chronic diastolic (congestive) heart failure (HCC) Active Problems:   Community acquired pneumonia   Bilateral pulmonary infiltrates on CXR   High risk medication use   Hypoxemic respiratory failure, chronic (HCC)   Stenosis of prosthetic aortic valve   Acute kidney injury superimposed on chronic kidney disease  Discharge Condition: Improved.  Diet recommendation: Low sodium, heart healthy.    Wound care: None.  Code status: Full.   History of Present Illness:   Zoe  B [name] is a 89 y.o. female with past medical history significant for paroxysmal A-fib on Eliquis , chronic HFpEF, hypothyroidism, hypertension, hyperlipidemia, GERD, multiple myeloma on Revlimid , presented to hospital with gradually progressive shortness of breath for 5 days with bilateral lower extremity edema, subjective fever cough and congestion.  In the ED, patient was afebrile but tachypneic and needed supplemental oxygen..  Chest x-ray showed right upper lobe airspace opacity suspicious for pneumonia or aspiration with chronic interstitial findings.  Had bilateral trace pleural effusion as well.  Labs were notable for BNP more than 600.  In the ED, patient was given IV Lasix , empiric IV antibiotics with Rocephin  and azithromycin  and was admitted hospital for further evaluation and treatment.    Hospital Course:   Following conditions  were addressed during hospitalization as listed below,  Acute on chronic HFpEF History of severe AS status post TAVR in the past.  BNP more than 600.  Was volume overloaded on presentation.   Seen by cardiology and received IV Lasix  during hospitalization.  Patient was taken off amiodarone  due to concerns for possible interstitial pulmonary changes.  Patient has clinically improved and is on room air.  Blood cultures have been negative.  No further indications for antibiotic.  COVID influenza and RSV was negative as well.  Patient has been added on Jardiance  on discharge and will resume with Lasix  from home.  Recommend follow-up with cardiology as outpatient.   Paroxysmal A-fib on Eliquis  Rate controlled at this time.  Continue Eliquis .  Has been taken off amiodarone  as per cardiology and will be continued on metoprolol     Chronic normocytic anemia likely secondary to multiple myeloma Last hemoglobin of 11.1 from 9.7.  Patient follows up with oncology as outpatient.   Hypokalemia Improved after replacement.  Potassium prior to discharge was 3.6    Multiple myeloma Generalized weakness Ambulates with the help of walker at home.  Had some pain in the leg so Neurontin  was added to the regimen this time.  PT OT recommends home health on discharge.   Hyperlipidemia Continue Crestor  from home   Hypothyroidism Continue Synthroid    Bilateral lower extremity tingling numbness.  Could be peripheral neuropathy.  Feels a little better with gabapentin  since yesterday.  Will continue on discharge.   Class I obesity. Body mass index is 30.38 kg/m.  Would benefit from lifestyle modification as outpatient  Disposition.  At this time, patient is stable for disposition home with outpatient PCP, oncology and cardiology  follow-up  Medical Consultants:   Cardiology  Procedures:    None Subjective:   Today, patient was seen and examined at bedside.  Feels a little better in her legs with reduced  pain and burning.  No shortness of breath dyspnea or chest pain.  Discharge Exam:   Vitals:   10/08/24 0431 10/08/24 0823  BP: 114/84 (!) 125/47  Pulse: 74 67  Resp: 15 (!) 25  Temp: 98 F (36.7 C) 98.1 F (36.7 C)  SpO2: 94% 94%   Vitals:   10/07/24 2101 10/08/24 0029 10/08/24 0431 10/08/24 0823  BP: 117/68 119/68 114/84 (!) 125/47  Pulse:  67 74 67  Resp:  19 15 (!) 25  Temp:  98.3 F (36.8 C) 98 F (36.7 C) 98.1 F (36.7 C)  TempSrc:  Oral Oral Oral  SpO2:  97% 94% 94%  Weight:   77.7 kg   Height:       Body mass index is 30.34 kg/m.  General: Alert awake, not in obvious distress, elderly female, on room air HENT: pupils equally reacting to light,  No scleral pallor or icterus noted. Oral mucosa is moist.  Chest:  Diminished breath sounds bilaterally. No crackles or wheezes.  CVS: S1 &S2 heard.  Systolic murmur noted regular rate and rhythm. Abdomen: Soft, nontender, nondistended.  Bowel sounds are heard.   Extremities: No cyanosis, clubbing or edema.  Peripheral pulses are palpable. Psych: Alert, awake and oriented, normal mood CNS:  No cranial nerve deficits.  Moves all extremities Skin: Warm and dry.  No rashes noted.  The results of significant diagnostics from this hospitalization (including imaging, microbiology, ancillary and laboratory) are listed below for reference.     Diagnostic Studies:   ECHOCARDIOGRAM COMPLETE Result Date: 10/06/2024    ECHOCARDIOGRAM REPORT   Patient Name:   Zoe  B [name] Date of Exam: 10/06/2024 Medical Rec #:  [id number]           Height:       64.0 in Accession #:    [id number]          Weight:       187.4 lb Date of Birth:  01-02-1934            BSA:          1.903 m Patient Age:    89 years            BP:           114/47 mmHg Patient Gender: F                   HR:           70 bpm. Exam Location:  Inpatient Procedure: 2D Echo, Cardiac Doppler and Color Doppler (Both Spectral and Color            Flow Doppler were utilized  during procedure). Indications:    ACHF- acute diastolic  History:        Patient has prior history of Echocardiogram examinations, most                 recent 01/09/2024. CHF, Pacemaker, Arrythmias:Atrial                 Fibrillation; Risk Factors:Hypertension and Dyslipidemia.                 Aortic Valve: 23 mm Sapien prosthetic, stented (TAVR) valve is  present in the aortic position. Procedure Date: 09/03/2018.  Sonographer:    Merlynn Argyle Referring Phys: [id number] CAROLE N HALL IMPRESSIONS  1. Left ventricular ejection fraction, by estimation, is 55 to 60%. The left ventricle has normal function. The left ventricle has no regional wall motion abnormalities. There is mild concentric left ventricular hypertrophy. Left ventricular diastolic parameters are consistent with Grade II diastolic dysfunction (pseudonormalization). Elevated left atrial pressure.  2. Right ventricular systolic function is normal. The right ventricular size is normal. There is mildly elevated pulmonary artery systolic pressure. The estimated right ventricular systolic pressure is 37.3 mmHg.  3. Left atrial size was moderately dilated.  4. The mitral valve is degenerative. Mild mitral valve regurgitation. Mild mitral stenosis. The mean mitral valve gradient is 4.0 mmHg with average heart rate of 65 bpm. Moderate to severe mitral annular calcification.  5. Prothetic valve well seated with calcified leaflet and hemodynamics concerning for prosthetic valve stenosis (PV 3.45 m/sec, MG 28 mmHg, and DVI 0.31). Trivial transvalvular regurgitation. The aortic valve has been repaired/replaced. Aortic valve regurgitation is trivial. There is a 23 mm Sapien prosthetic (TAVR) valve present in the aortic position. Procedure Date: 09/03/2018. Comparison(s): Prior images reviewed side by side. Gradient across prosthetic valve is higher. FINDINGS  Left Ventricle: Left ventricular ejection fraction, by estimation, is 55 to 60%. The left ventricle  has normal function. The left ventricle has no regional wall motion abnormalities. The left ventricular internal cavity size was normal in size. There is  mild concentric left ventricular hypertrophy. Left ventricular diastolic function could not be evaluated due to mitral annular calcification (moderate or greater). Left ventricular diastolic parameters are consistent with Grade II diastolic dysfunction (pseudonormalization). Elevated left atrial pressure. Right Ventricle: The right ventricular size is normal. No increase in right ventricular wall thickness. Right ventricular systolic function is normal. There is mildly elevated pulmonary artery systolic pressure. The tricuspid regurgitant velocity is 2.93  m/s, and with an assumed right atrial pressure of 3 mmHg, the estimated right ventricular systolic pressure is 37.3 mmHg. Left Atrium: Left atrial size was moderately dilated. Right Atrium: Right atrial size was normal in size. Pericardium: There is no evidence of pericardial effusion. Mitral Valve: The mitral valve is degenerative in appearance. Moderate to severe mitral annular calcification. Mild mitral valve regurgitation. Mild mitral valve stenosis. The mean mitral valve gradient is 4.0 mmHg with average heart rate of 65 bpm. Tricuspid Valve: The tricuspid valve is normal in structure. Tricuspid valve regurgitation is not demonstrated. No evidence of tricuspid stenosis. Aortic Valve: Prothetic valve well seated with calcified leaflet and hemodynamics concerning for prosthetic valve stenosis (PV 3.45 m/sec, MG 28 mmHg, and DVI 0.31). Trivial transvalvular regurgitation. The aortic valve has been repaired/replaced. Aortic  valve regurgitation is trivial. Aortic valve mean gradient measures 24.0 mmHg. Aortic valve peak gradient measures 40.5 mmHg. Aortic valve area, by VTI measures 0.76 cm. There is a 23 mm Sapien prosthetic, stented (TAVR) valve present in the aortic position. Procedure Date: 09/03/2018.  Pulmonic Valve: The pulmonic valve was not well visualized. Pulmonic valve regurgitation is not visualized. Aorta: The aortic root and ascending aorta are structurally normal, with no evidence of dilitation. IAS/Shunts: No atrial level shunt detected by color flow Doppler.  LEFT VENTRICLE PLAX 2D LVIDd:         4.10 cm   Diastology LVIDs:         2.90 cm   LV e' medial:    4.13 cm/s LV PW:  1.30 cm   LV E/e' medial:  35.6 LV IVS:        1.30 cm   LV e' lateral:   8.33 cm/s LVOT diam:     1.70 cm   LV E/e' lateral: 17.6 LV SV:         55 LV SV Index:   29 LVOT Area:     2.27 cm  RIGHT VENTRICLE             IVC RV Basal diam:  3.40 cm     IVC diam: 1.40 cm RV S prime:     11.50 cm/s TAPSE (M-mode): 1.9 cm LEFT ATRIUM              Index        RIGHT ATRIUM           Index LA diam:        4.70 cm  2.47 cm/m   RA Area:     12.30 cm LA Vol (A2C):   106.0 ml 55.70 ml/m  RA Volume:   22.50 ml  11.82 ml/m LA Vol (A4C):   78.5 ml  41.25 ml/m LA Biplane Vol: 91.2 ml  47.93 ml/m  AORTIC VALVE AV Area (Vmax):    0.65 cm AV Area (Vmean):   0.67 cm AV Area (VTI):     0.76 cm AV Vmax:           318.33 cm/s AV Vmean:          219.200 cm/s AV VTI:            0.730 m AV Peak Grad:      40.5 mmHg AV Mean Grad:      24.0 mmHg LVOT Vmax:         90.70 cm/s LVOT Vmean:        64.867 cm/s LVOT VTI:          0.244 m LVOT/AV VTI ratio: 0.33  AORTA Ao Root diam: 3.00 cm Ao Asc diam:  3.00 cm MITRAL VALVE                TRICUSPID VALVE MV Area (PHT): 3.34 cm     TR Peak grad:   34.3 mmHg MV Mean grad:  4.0 mmHg     TR Vmax:        293.00 cm/s MV Decel Time: 227 msec MV E velocity: 147.00 cm/s  SHUNTS MV A velocity: 114.00 cm/s  Systemic VTI:  0.24 m MV E/A ratio:  1.29         Systemic Diam: 1.70 cm Joelle Azobou Tonleu Electronically signed by Joelle Cedars Tonleu Signature Date/Time: 10/06/2024/6:07:11 PM    Final    CT Angio Chest Pulmonary Embolism (PE) W or WO Contrast Result Date: 10/05/2024 EXAM: CTA of the Chest  with contrast for PE 10/05/2024 04:21:04 AM TECHNIQUE: CTA of the chest was performed without and with the administration of intravenous contrast. Multiplanar reformatted images are provided for review. MIP images are provided for review. Automated exposure control, iterative reconstruction, and/or weight based adjustment of the mA/kV was utilized to reduce the radiation dose to as low as reasonably achievable. CONTRAST: 75 mL iohexol  (OMNIPAQUE ) 350 MG/ML injection. COMPARISON: CTA chest abdomen and pelvis 08/15/2008, portable chest x ray earlier today, chest CT 01/11/2024. CLINICAL HISTORY: 89 year old female Pulmonary embolism (PE) suspected, high probability. FINDINGS: PULMONARY ARTERIES: Suboptimal but adequate pulmonary artery contrast timing. No pulmonary artery filling defect identified. Distal branches in  both the upper lobes and at the lung bases are obscured by mild respiratory motion. Main pulmonary artery is normal in caliber. MEDIASTINUM: Left chest cardiac pacemaker device now in place. Prosthetic aortic valve. Calcified coronary artery atherosclerosis (series 7 image 146) and extensive calcified atherosclerosis of the visible aorta. No pericardial effusion. LYMPH NODES: Reactive appearing mediastinal lymph nodes not significantly changed from 2019. No hilar or axillary lymphadenopathy. LUNGS AND PLEURA: Small bilateral layering pleural effusions with simple fluid density suggesting transudates. Extensive nodular bilateral lung opacity right greater than left in the upper lobes, superior segments of the lower lobes, middle lobe less affected. Confluent bilateral lower lobe lung opacity appears to reflect a combination of compressive atelectasis and airspace disease. Bilateral lung opacity most compatible with extensive infection, pneumonia. Early consolidation in the lower lobes. Pulmonary edema considered but unlikely. No pneumothorax. UPPER ABDOMEN: Chronic benign anterior right hepatic lobe 7 cm  hemangioma, stable in size and configuration since 2019 when it demonstrated typical peripheral puddling enhancement pattern. Chronic mild elevation of the right hemidiaphragm is stable. Otherwise negative visible mostly non-contrast liver, spleen, pancreas, adrenal glands and bowel in the upper abdomen. SOFT TISSUES AND BONES: Diffuse osteopenia. Chronic thoracic compression fractures, some previously augmented and stable. Healing mid sternal fracture since the previous CT. Healing right 7th and 8th rib fractures. No acute osseous abnormality. No acute soft tissue abnormality. IMPRESSION: 1. No pulmonary embolism identified; suboptimal evaluation due to contrast timing and mild respiratory motion. 2. Widespread bilateral lung opacity most compatible with infection / developing multilobar pneumonia. Small bilateral layering pleural effusions and early consolidation in the lower lobes. Reactive mediastinal lymph nodes. Pulmonary edema was also considered but felt unlikely. 3. Chronic osteopenia and compression fractures. Healing sternal and right rib fractures since March. Electronically signed by: Helayne Hurst MD 10/05/2024 04:40 AM EST RP Workstation: [id number]   DG Chest Portable 1 View Result Date: 10/05/2024 EXAM: 1 VIEW(S) XRAY OF THE CHEST 10/05/2024 03:09:37 AM COMPARISON: 07/24/2024 CLINICAL HISTORY: sob FINDINGS: LINES, TUBES AND DEVICES: Left chest pacemaker with leads overlying right atrium and right ventricle. Aortic valve replacement noted. LUNGS AND PLEURA: Right upper lobe airspace opacity. Chronic coarsened interstitial markings with mild edema. Bilateral trace pleural effusions. No pneumothorax. HEART AND MEDIASTINUM: Cardiomegaly. Atherosclerotic plaque noted. Left chest pacemaker with leads overlying right atrium and right ventricle. Aortic valve replacement noted. BONES AND SOFT TISSUES: Multilevel mid to lower thoracic vertebral augmentation. IMPRESSION: 1. Right upper lobe airspace opacity,  suspicious for pneumonia or aspiration. 2. Chronic interstitial lung disease with mild interstitial edema. 3. Bilateral trace pleural effusions. Electronically signed by: Oneil Devonshire MD 10/05/2024 03:20 AM EST RP Workstation: [id number]     Labs:   Basic Metabolic Panel: Recent Labs  Lab 10/05/24 0300 10/05/24 0626 10/06/24 0748 10/07/24 1202 10/08/24 1032  NA 137 138 140 137 135  K 4.2 3.4* 3.9 4.0 3.6  CL 105  --  102 95* 96*  CO2 21*  --  30 26 27   GLUCOSE 105*  --  104* 144* 140*  BUN 20  --  22 31* 35*  CREATININE 1.16*  --  1.30* 1.54* 1.49*  CALCIUM  8.1*  --  7.9* 8.8* 8.5*  MG  --   --  1.7 1.7  --   PHOS  --   --  4.5  --   --    GFR Estimated Creatinine Clearance: 25.8 mL/min (A) (by C-G formula based on SCr of 1.49 mg/dL (H)). Liver Function Tests: Recent Labs  Lab 10/05/24 0300  AST 17  ALT 10  ALKPHOS 69  BILITOT 1.3*  PROT 6.0*  ALBUMIN 2.7*   No results for input(s): LIPASE, AMYLASE in the last 168 hours. No results for input(s): AMMONIA in the last 168 hours. Coagulation profile No results for input(s): INR, PROTIME in the last 168 hours.  CBC: Recent Labs  Lab 10/05/24 0300 10/05/24 0626 10/07/24 1202  WBC 5.2  --  8.2  NEUTROABS 3.3  --   --   HGB 9.7* 8.8* 11.1*  HCT 31.5* 26.0* 34.7*  MCV 92.6  --  87.2  PLT 144*  --  226   Cardiac Enzymes: No results for input(s): CKTOTAL, CKMB, CKMBINDEX, TROPONINI in the last 168 hours. BNP: Invalid input(s): POCBNP CBG: No results for input(s): GLUCAP in the last 168 hours. D-Dimer No results for input(s): DDIMER in the last 72 hours. Hgb A1c No results for input(s): HGBA1C in the last 72 hours. Lipid Profile No results for input(s): CHOL, HDL, LDLCALC, TRIG, CHOLHDL, LDLDIRECT in the last 72 hours. Thyroid  function studies No results for input(s): TSH, T4TOTAL, T3FREE, THYROIDAB in the last 72 hours.  Invalid input(s): FREET3 Anemia work  up No results for input(s): VITAMINB12, FOLATE, FERRITIN, TIBC, IRON, RETICCTPCT in the last 72 hours. Microbiology Recent Results (from the past 240 hours)  Blood culture (routine x 2)     Status: None (Preliminary result)   Collection Time: 10/05/24  5:20 AM   Specimen: BLOOD RIGHT ARM  Result Value Ref Range Status   Specimen Description BLOOD RIGHT ARM  Final   Special Requests   Final    BOTTLES DRAWN AEROBIC AND ANAEROBIC Blood Culture adequate volume   Culture   Final    NO GROWTH 3 DAYS Performed at [facility], [address] [place], [place] [address]    Report Status PENDING  Incomplete  Blood culture (routine x 2)     Status: None (Preliminary result)   Collection Time: 10/05/24  6:16 AM   Specimen: BLOOD RIGHT HAND  Result Value Ref Range Status   Specimen Description BLOOD RIGHT HAND  Final   Special Requests   Final    BOTTLES DRAWN AEROBIC AND ANAEROBIC Blood Culture adequate volume   Culture   Final    NO GROWTH 3 DAYS Performed at [facility], [address] [place], [place] [address]    Report Status PENDING  Incomplete  Resp panel by RT-PCR (RSV, Flu A&B, Covid) Anterior Nasal Swab     Status: None   Collection Time: 10/05/24  7:38 AM   Specimen: Anterior Nasal Swab  Result Value Ref Range Status   SARS Coronavirus 2 by RT PCR NEGATIVE NEGATIVE Final   Influenza A by PCR NEGATIVE NEGATIVE Final   Influenza B by PCR NEGATIVE NEGATIVE Final    Comment: (NOTE) The Xpert Xpress SARS-CoV-2/FLU/RSV plus assay is intended as an aid in the diagnosis of influenza from Nasopharyngeal swab specimens and should not be used as a sole basis for treatment. Nasal washings and aspirates are unacceptable for Xpert Xpress SARS-CoV-2/FLU/RSV testing.  Fact Sheet for Patients: bloggercourse.com  Fact Sheet for Healthcare Providers: seriousbroker.it  This test is not yet approved or cleared by  the [place]  FDA and has been authorized for detection and/or diagnosis of SARS-CoV-2 by FDA under an Emergency Use Authorization (EUA). This EUA will remain in effect (meaning this test can be used) for the duration of the COVID-19 declaration under  Section 564(b)(1) of the Act, 21 U.S.C. section 360bbb-3(b)(1), unless the authorization is terminated or revoked.     Resp Syncytial Virus by PCR NEGATIVE NEGATIVE Final    Comment: (NOTE) Fact Sheet for Patients: bloggercourse.com  Fact Sheet for Healthcare Providers: seriousbroker.it  This test is not yet approved or cleared by the [place]  FDA and has been authorized for detection and/or diagnosis of SARS-CoV-2 by FDA under an Emergency Use Authorization (EUA). This EUA will remain in effect (meaning this test can be used) for the duration of the COVID-19 declaration under Section 564(b)(1) of the Act, 21 U.S.C. section 360bbb-3(b)(1), unless the authorization is terminated or revoked.  Performed at [facility], [address], [place], [place] [address]      Discharge Instructions:   Discharge Instructions     Discharge instructions   Complete by: As directed    Follow up with your primary care provider in one week. Check blood work at that time. Follow up with Cardiology Dr Zoe [name] as outpatient as scheduled by the clinic.  Seek medical attention for worsening symptoms.   Increase activity slowly   Complete by: As directed       Allergies as of 10/08/2024       Reactions   Quinolones Other (See Comments)   Patient on Amiodarone  and can prolong QT   Penicillins Other (See Comments)   UNSPECIFIED REACTION  Patient does not remember reaction.  Has patient had a PCN reaction causing immediate rash, facial/tongue/throat swelling, SOB or lightheadedness with hypotension: no Has patient had a PCN reaction causing severe rash involving mucus membranes or  skin necrosis: no Has patient had a PCN reaction that required hospitalization no Has patient had a PCN reaction occurring within the last 10 years: no If all of the above answers are NO, then may proceed with Cephalosporin use.   Sulfa Antibiotics Other (See Comments)   UNSPECIFIED REACTION  maybe vision issues?         Medication List     STOP taking these medications    amiodarone  100 MG tablet Commonly known as: PACERONE        TAKE these medications    acetaminophen  500 MG tablet Commonly known as: TYLENOL  Take 1,000 mg by mouth every 6 (six) hours as needed.   acyclovir  400 MG tablet Commonly known as: ZOVIRAX  Take 400 mg by mouth 2 (two) times daily.   amLODipine  5 MG tablet Commonly known as: NORVASC  TAKE 1 TABLET BY MOUTH AT BEDTIME   apixaban  5 MG Tabs tablet Commonly known as: Eliquis  Take 1 tablet (5 mg total) by mouth 2 (two) times daily.   Cholecalciferol  25 MCG (1000 UT) Tbdp Take 1,000 Units by mouth in the morning.   cyanocobalamin  1000 MCG tablet Take 1,000 mcg by mouth in the morning.   Docusate Sodium  100 MG capsule Take 100 mg by mouth as needed for constipation.   empagliflozin  10 MG Tabs tablet Commonly known as: Jardiance  Take 1 tablet (10 mg total) by mouth daily.   furosemide  20 MG tablet Commonly known as: LASIX  Three times a week as needed; take potassium if taking lasix    gabapentin  100 MG capsule Commonly known as: NEURONTIN  Take 1 capsule (100 mg total) by mouth 3 (three) times daily.   lenalidomide  5 MG capsule Commonly known as: REVLIMID  Take 1 capsule (5 mg total) by mouth daily. Take 1 capsule daily for 21 days and then none for 7 days.   levothyroxine  100 MCG  tablet Commonly known as: SYNTHROID  Take 100 mcg by mouth daily before breakfast.   losartan  50 MG tablet Commonly known as: COZAAR  Take 1 tablet (50 mg total) by mouth daily. Take at night What changed:  when to take this additional instructions    metoprolol  succinate 50 MG 24 hr tablet Commonly known as: TOPROL -XL Take 50 mg by mouth daily. Take with or immediately following a meal. Take in the Morning   oxycodone  5 MG capsule Commonly known as: OXY-IR Take 5 mg by mouth every 12 (twelve) hours as needed for pain.   pantoprazole  40 MG tablet Commonly known as: PROTONIX  Take 1 tablet (40 mg total) by mouth daily.   polyethylene glycol 17 g packet Commonly known as: MIRALAX  / GLYCOLAX  Take 17 g by mouth daily.   potassium chloride  SA 20 MEQ tablet Commonly known as: KLOR-CON  M Three times a week as needed only if taking lasix    PreserVision AREDS Caps Take 1 capsule by mouth 2 (two) times daily.   rosuvastatin  20 MG tablet Commonly known as: CRESTOR  Take 1 tablet (20 mg total) by mouth daily.        Follow-up Information     Connect with your PCP/Specialist as discussed. Schedule an appointment as soon as possible for a visit .   Contact information: [email or web address] Call our physician referral line at [phone].        Ladona Heinz, MD Follow up.   Specialty: Cardiology Contact information: [address] [place] [address] [phone]                  Time coordinating discharge: 39 minutes  Signed:  Gelsey Amyx  [facility] 10/08/2024, 11:26 AM

## 2024-10-08 NOTE — Progress Notes (Signed)
 Heart Failure Navigator Progress Note  Assessed for Heart & Vascular TOC clinic readiness.  Patient declined HF TOC, wants to only see Dr. Ladona in the office. Patient was educated on the sign and symptoms of heart failure, daily weights, when to call her doctor or go to the ED. Diet/ fluid restrictions, taking medications as prescribed and attending all medical appointments. Patient verbalized her understanding of the education. .   Navigator will sign off at this time. SABRA Stephane Haddock, BSN, RN Heart Failure Teacher, Adult Education Only

## 2024-10-08 NOTE — Progress Notes (Signed)
° °  Heart Failure Stewardship Pharmacist Progress Note   PCP: Gerome Brunet, DO PCP-Cardiologist: Gordy Bergamo, MD    HPI:  89 yo F with PMH of afib, CHF, severe AS s/p TAVR, hypothyroidism, HTN, HLD, GERD, and multiple myeloma.   Presented to the ED on 12/14 with shortness of breath, LE edema, cough, and congestion for the last 5 days. CXR suspicious for PNA or aspiration with mild edema. BNP elevated. Started on IV lasix . ECHO 12/15 with LVEF 55-60%, mild concentric LVH, G2DD, RV normal. Cardiology consulted. Was taken off amiodarone  for concern of lung toxicity contributing to symptoms. Repeat CXR pending.   Reports she is feeling improved. Denies shortness of breath, chest pain, lightheadedness, or dizziness. No LE edema. Hopeful for discharge today. Agreeable to using [facility] TOC pharmacy at discharge.   Current HF Medications: Beta Blocker: metoprolol  XL 50 mg daily  Prior to admission HF Medications: Diuretic: furosemide  20 mg three times weekly PRN Beta blocker: metoprolol  XL 50 mg daily ACE/ARB/ARNI: losartan  50 mg daily  Pertinent Lab Values: Serum creatinine 1.54, BUN 31, Potassium 4.0, Sodium 137, BNP 587, Magnesium  1.7  Vital Signs: Weight: 171 lbs (admission weight: 187 lbs) Blood pressure: 110/60s  Heart rate: 70s  I/O: incomplete  Medication Assistance / Insurance Benefits Check: Does the patient have prescription insurance?  Yes Type of insurance plan: [facility] Medicare  Outpatient Pharmacy:  Prior to admission [facility] Is the patient willing to use [facility] TOC pharmacy at discharge? Yes Is the patient willing to transition their outpatient pharmacy to utilize a [facility] outpatient pharmacy?   No    Assessment: 1. Acute on chronic diastolic CHF (LVEF 55-60%). NYHA class II symptoms. - Volume status improved. Now off IV lasix  but creatinine bumped to 1.54 today (was 1.16 on admission). - Continue metoprolol  XL 50 mg daily - Holding PTA losartan  with lower  BP and now with AKI - Consider adding Jardiance  10 mg daily prior to discharge if creatinine improves   Plan: 1) Medication changes recommended at this time: - Add Jardiance  10 mg daily tomorrow if creatinine improves  2) Patient assistance: - Jardiance  copay $0  3)  Education  - Patient has been educated on current HF medications and potential additions to HF medication regimen - Patient verbalizes understanding that over the next few months, these medication doses may change and more medications may be added to optimize HF regimen - Patient has been educated on basic disease state pathophysiology and goals of therapy   Duwaine Plant, PharmD, BCPS Heart Failure Stewardship Pharmacist Phone [phone]

## 2024-10-08 NOTE — Telephone Encounter (Signed)
 Patient Advocate Encounter  Test billing for this patient's current coverage ([employer] MPD) returns a $0 copay for 90 day supply of Jardiance .  This test claim was processed through [facility]- copay amounts may vary at other pharmacies due to pharmacy/plan contracts, or as the patient moves through the different stages of their insurance plan.  Rachel DEL, CPhT Rx Patient Advocate Phone: [phone]

## 2024-10-08 NOTE — Plan of Care (Signed)

## 2024-10-08 NOTE — TOC Transition Note (Signed)
 Transition of Care [facility]) - Discharge Note   Patient Details  Name: Zoe [name] MRN: [id number] Date of Birth: Aug 11, 1934  [facility] of [facility]) CM/SW Contact:  Waddell Barnie Rama, RN Phone Number: 10/08/2024, 11:36 AM   Clinical Narrative:    For dc today, she has transportation at dc.  She will do physical therapy with [facility] on[facility] at [employer].    Final next level of care: Home w Home Health Services Barriers to Discharge: Continued Medical Work up   Patient Goals and CMS Choice Patient states their goals for this hospitalization and ongoing recovery are:: return to [place] [facility] CMS Medicare.gov Compare Post Acute Care list provided to:: Patient Represenative (must comment) Choice offered to / list presented to : Patient      Discharge Placement                       Discharge Plan and Services Additional resources added to the After Visit Summary for   In-house Referral: NA Discharge Planning Services: CM Consult Post Acute Care Choice: Home Health          DME Arranged: N/A DME Agency: NA       HH Arranged: PT HH Agency:  International Aid/development Worker at [employer])        Social Drivers of Health (SDOH) Interventions SDOH Screenings   Food Insecurity: No Food Insecurity (10/06/2024)  Housing: Low Risk (10/06/2024)  Transportation Needs: No Transportation Needs (10/06/2024)  Utilities: Not At Risk (10/06/2024)  Alcohol  Screen: Low Risk (10/08/2024)  Depression (PHQ2-9): Low Risk (08/11/2024)  Financial Resource Strain: Low Risk (10/08/2024)  Social Connections: Moderately Integrated (10/06/2024)  Tobacco Use: Low Risk (10/05/2024)     Readmission Risk Interventions    10/07/2024   12:51 PM  Readmission Risk Prevention Plan  Transportation Screening Complete  Medication Review (RN Care Manager) Complete  HRI or Home Care Consult Complete  Palliative Care Screening Not Applicable  Skilled Nursing Facility Not Applicable

## 2024-10-08 NOTE — Progress Notes (Signed)
 Patient Name: Zoe [name] Date of Encounter: 10/08/2024 [facility] Cardiologist: Gordy Bergamo, MD  10/05/2024 .admit Length of stay: 3  Interval Summary  .    Zoe [name] is a 89 y.o. Caucasian female with history of possibly mild coronary artery disease by coronary angiogram in 2014,  severe AS s/p TAVR by Dr. Dusty on 09/03/2018,  essential hypertension, chronic stage 3a CKD, mild hyponatremia probably a combination of chronic diastolic heart failure and medications, hyperlipidemia, hyperglycemia and GERD.  She also has paroxysmal atrial fibrillation and is presently on low-dose amiodarone  and maintaining sinus rhythm.  Now admitted to the hospital with worsening shortness of breath, hypoxemia and presumed community-acquired pneumonia and acute on chronic diastolic heart failure.   She feels much better this morning, would like to go home.  Leg cramps are improved.  Physical Exam    Vitals:   10/07/24 2101 10/08/24 0029 10/08/24 0431 10/08/24 0823  BP: 117/68 119/68 114/84 (!) 125/47  Pulse:  67 74 67  Resp:  19 15 (!) 25  Temp:  98.3 F (36.8 C) 98 F (36.7 C) 98.1 F (36.7 C)  TempSrc:  Oral Oral Oral  SpO2:  97% 94% 94%  Weight:   77.7 kg   Height:       Physical Exam Neck:     Vascular: Carotid bruit (Bilateral) present. No JVD.  Cardiovascular:     Rate and Rhythm: Normal rate and regular rhythm.     Pulses: Intact distal pulses.     Heart sounds: S1 normal and S2 normal. Murmur heard.     Early systolic murmur is present with a grade of 3/6 at the upper right sternal border radiating to the neck.     No gallop.  Pulmonary:     Effort: Pulmonary effort is normal.     Breath sounds: Normal breath sounds.  Abdominal:     General: Bowel sounds are normal.     Palpations: Abdomen is soft.  Musculoskeletal:     Right lower leg: No edema.     Left lower leg: No edema.        10/08/2024    4:31 AM 10/07/2024    6:15 AM 10/06/2024     4:34 PM  Last 3 Weights  Weight (lbs) 171 lb 4.8 oz 171 lb 8.3 oz 172 lb 13.5 oz  Weight (kg) 77.701 kg 77.8 kg 78.4 kg      Labs   Lab Results  Component Value Date   NA 137 10/07/2024   K 4.0 10/07/2024   CO2 26 10/07/2024   GLUCOSE 144 (H) 10/07/2024   BUN 31 (H) 10/07/2024   CREATININE 1.54 (H) 10/07/2024   CALCIUM  8.8 (L) 10/07/2024   EGFR 59 (L) 06/19/2022   GFRNONAA 32 (L) 10/07/2024       Latest Ref Rng & Units 10/07/2024   12:02 PM 10/06/2024    7:48 AM 10/05/2024    6:26 AM  BMP  Glucose 70 - 99 mg/dL 855  895    BUN 8 - 23 mg/dL 31  22    Creatinine 9.55 - 1.00 mg/dL 8.45  8.69    Sodium 864 - 145 mmol/L 137  140  138   Potassium 3.5 - 5.1 mmol/L 4.0  3.9  3.4   Chloride 98 - 111 mmol/L 95  102    CO2 22 - 32 mmol/L 26  30    Calcium  8.9 - 10.3 mg/dL 8.8  7.9  Latest Ref Rng & Units 10/07/2024   12:02 PM 10/05/2024    6:26 AM 10/05/2024    3:00 AM  CBC  WBC 4.0 - 10.5 K/uL 8.2   5.2   Hemoglobin 12.0 - 15.0 g/dL 88.8  8.8  9.7   Hematocrit 36.0 - 46.0 % 34.7  26.0  31.5   Platelets 150 - 400 K/uL 226   144     Lab Results  Component Value Date   CHOL 159 04/01/2024   HDL 64 04/01/2024   LDLCALC 84 04/01/2024   LDLDIRECT 48 07/16/2019   TRIG 56 04/01/2024   CHOLHDL 2.5 04/01/2024    Lab Results  Component Value Date   TSH 0.891 01/09/2024    Lab Results  Component Value Date   HGBA1C 5.1 03/31/2024    Cardiac Panel (last 3 results) No results for input(s): CKTOTAL, CKMB, TROPONINIHS, RELINDX in the last 72 hours.  BNP (last 3 results) Recent Labs    10/05/24 0300  BNP 608.5*    ProBNP (last 3 results) Recent Labs    10/08/24 0655  PROBNP 587.0*     Intake/Output Summary (Last 24 hours) at 10/08/2024 1113 Last data filed at 10/08/2024 0700 Gross per 24 hour  Intake 596 ml  Output 300 ml  Net 296 ml    Net IO Since Admission: -904 mL [10/08/24 1113]  Tele/EKG/Cardiac studies    Telemetry: APVS  rhythm   Radiology   Imaging results have been reviewed   Current Meds:    Current Medications[1]  Assessment & Plan .     1.  Acute hypoxemic respiratory failure secondary to acute on chronic diastolic heart failure 2.  Chronic kidney disease stage IIIb 3.  Prosthetic aortic valve stenosis  Recommendations: Doubt that she has pneumonia clinically as she has no fever although she is immunocompromised and can certainly have community-acquired pneumonia.  Will leave it to the primary team to see whether she needs continued antibiotics.  With regard to acute on chronic diastolic heart failure, she is now well diuresed, I personally reviewed the chest x-ray which shows marked improvement in fluffy infiltrates bilaterally which is suggestive of pulmonary edema and she still has scarring appearance especially in the mid and basal lobes, I am still concerned about amiodarone  toxicity.  For now I have discontinued amiodarone .  She has moderate aortic valve stenosis, this needs to be followed up closely, if she has recurrent episodes of heart failure or continues to have significant dyspnea, she may need redo TAVR.  Will schedule outpatient follow-up.  As discussed with the primary team, discharge medications will include Jardiance  10 mg daily, continue furosemide  3 times a week as before.  Resume all home medications as well.   Gordy Bergamo, MD, [facility] 10/08/2024, 11:16 AM [facility] [address] [place], [place] [address] Phone: [phone]. Fax:  [phone]  For questions or updates, please contact [facility] Please consult [email or web address] for contact info under        Signed,   Gordy Bergamo, MD, [facility] 10/08/2024, 11:13 AM [facility] [address] [place], [place] [address] Phone: [phone]. Fax:  [phone]      [1]  Current Facility-Administered Medications:    acetaminophen  (TYLENOL ) tablet 500 mg, 500 mg, Oral, Q6H PRN, Hall, Carole N, DO    acyclovir  (ZOVIRAX ) tablet 400 mg, 400 mg, Oral, BID, Pokhrel, Laxman, MD, 400 mg at 10/08/24 9061   amLODipine  (NORVASC ) tablet 5 mg, 5 mg, Oral, QHS, Pokhrel, Laxman, MD,  5 mg at 10/07/24 2101   apixaban  (ELIQUIS ) tablet 5 mg, 5 mg, Oral, BID, Merilee Linsey I, RPH, 5 mg at 10/08/24 9060   cholecalciferol  (VITAMIN D3) 25 MCG (1000 UNIT) tablet 1,000 Units, 1,000 Units, Oral, q AM, Pokhrel, Laxman, MD, 1,000 Units at 10/08/24 0604   cyanocobalamin  (VITAMIN B12) tablet 1,000 mcg, 1,000 mcg, Oral, q AM, Pokhrel, Laxman, MD, 1,000 mcg at 10/08/24 0604   docusate sodium  (COLACE) capsule 100 mg, 100 mg, Oral, Daily, Pokhrel, Laxman, MD, 100 mg at 10/08/24 9061   empagliflozin  (JARDIANCE ) tablet 10 mg, 10 mg, Oral, Daily, Sheli Dorin, MD   gabapentin  (NEURONTIN ) capsule 100 mg, 100 mg, Oral, TID, Pokhrel, Laxman, MD, 100 mg at 10/08/24 0939   ipratropium-albuterol  (DUONEB) 0.5-2.5 (3) MG/3ML nebulizer solution 3 mL, 3 mL, Nebulization, Q2H PRN, Hall, Carole N, DO   levothyroxine  (SYNTHROID ) tablet 100 mcg, 100 mcg, Oral, Q0600, Hall, Carole N, DO, 100 mcg at 10/08/24 0604   melatonin tablet 5 mg, 5 mg, Oral, QHS PRN, Shona Laurence N, DO, 5 mg at 10/06/24 9967   metoprolol  succinate (TOPROL -XL) 24 hr tablet 50 mg, 50 mg, Oral, Daily, Pokhrel, Laxman, MD, 50 mg at 10/08/24 9061   multivitamin with minerals tablet 1 tablet, 1 tablet, Oral, Daily, Pokhrel, Laxman, MD, 1 tablet at 10/07/24 0935   oxyCODONE  (Oxy IR/ROXICODONE ) immediate release tablet 5 mg, 5 mg, Oral, Q12H PRN, Pokhrel, Laxman, MD, 5 mg at 10/07/24 9060   pantoprazole  (PROTONIX ) EC tablet 40 mg, 40 mg, Oral, Daily, Pokhrel, Laxman, MD, 40 mg at 10/08/24 9060   polyethylene glycol (MIRALAX  / GLYCOLAX ) packet 17 g, 17 g, Oral, Daily PRN, Shona, Carole N, DO   prochlorperazine  (COMPAZINE ) injection 5 mg, 5 mg, Intravenous, Q6H PRN, Hall, Carole N, DO   rosuvastatin  (CRESTOR ) tablet 20 mg, 20 mg, Oral, Daily, Hall, Carole N, DO, 20 mg at  10/08/24 [phone]

## 2024-10-08 NOTE — Plan of Care (Signed)

## 2024-10-09 ENCOUNTER — Telehealth: Payer: Self-pay

## 2024-10-09 NOTE — Transitions of Care (Post Inpatient/ED Visit) (Signed)
° °  10/09/2024  Name: JOANNIE MEDINE MRN: [id number] DOB: April 03, 1934  Today's TOC FU Call Status:Patient requested a call back earlier today.  Later call back unsuccessful. Message left    Attempted to reach the patient regarding the most recent Inpatient/ED visit.  Follow Up Plan: Additional outreach attempts will be made to reach the patient to complete the Transitions of Care (Post Inpatient/ED visit) call.   Corrisa Gibby J. Lashan Macias RN, MSN [facility], [facility] RN Care Manager Direct Dial: [phone]  Fax: [phone] Website: [email or web address]

## 2024-10-09 NOTE — Transitions of Care (Post Inpatient/ED Visit) (Signed)
° °  10/09/2024  Name: Zoe  REVELLA [name] MRN: [id number] DOB: 16-Mar-1934  Today's TOC FU Call Status: Today's TOC FU Call Status:: Unsuccessful Call (1st Attempt) Unsuccessful Call (1st Attempt) Date: 10/09/24  Attempted to reach the patient regarding the most recent Inpatient/ED visit.  Spoke with patient stating she is unable to talk right now working with her CNA.  Follow Up Plan: Additional outreach attempts will be made to reach the patient to complete the Transitions of Care (Post Inpatient/ED visit) call.   Richerd Fish, RN, BSN, CCM [facility], [facility] Management Coordinator Direct Dial: [phone]

## 2024-10-10 ENCOUNTER — Telehealth: Payer: Self-pay

## 2024-10-10 LAB — CULTURE, BLOOD (ROUTINE X 2)
Culture: NO GROWTH
Culture: NO GROWTH
Special Requests: ADEQUATE
Special Requests: ADEQUATE

## 2024-10-10 NOTE — Transitions of Care (Post Inpatient/ED Visit) (Signed)
" ° °  10/10/2024  Name: Zoe [name] MRN: [id number] DOB: [birth year]  Today's TOC FU Call Status: Today's TOC FU Call Status:: Unsuccessful Call (3rd Attempt) Unsuccessful Call (3rd Attempt) Date: 10/10/24  Attempted to reach the patient regarding the most recent Inpatient/ED visit.  Follow Up Plan: No further outreach attempts will be made at this time. We have been unable to contact the patient.  [name] J. Artavis Cowie RN, MSN [facility], [facility] Health RN Care Manager Direct Dial: [phone]  Fax: [phone] Website: [email or web address]   "

## 2024-10-10 NOTE — Transitions of Care (Post Inpatient/ED Visit) (Signed)
" ° °  10/10/2024  Name: Zoe [name] MRN: [id number] DOB: 28-Jun-1934  Today's TOC FU Call Status: Today's TOC FU Call Status:: Unsuccessful Call (2nd Attempt) Unsuccessful Call (2nd Attempt) Date: 10/10/24  Attempted to reach the patient regarding the most recent Inpatient/ED visit.  Follow Up Plan: Additional outreach attempts will be made to reach the patient to complete the Transitions of Care (Post Inpatient/ED visit) call.   Eldonna Neuenfeldt J. Kelley Polinsky RN, MSN [facility], [facility] Health RN Care Manager Direct Dial: [phone]  Fax: [phone] Website: [email or web address]   "

## 2024-10-17 ENCOUNTER — Telehealth: Payer: Self-pay | Admitting: Cardiology

## 2024-10-17 ENCOUNTER — Ambulatory Visit (HOSPITAL_COMMUNITY)

## 2024-10-17 NOTE — Telephone Encounter (Signed)
 Patient's daughter's message reports patient has swelling in ankles and legs. Weight gain of 5 lbs overnight. No SOB.   Spoke with patient, she confirmed the above information. She also reports she had not taken any Lasix  since she was in the hospital, but did take a dose this morning with potassium. She is wearing compression socks and elevating her feet. She reports she is urinating without issues.  Patient hospitalized 10/05/24 to 10/08/24 for CHF.  Instructed patient to continue wearing compression socks and elevating feet.   Current Rx for Lasix  listed as 20 mg 3 times weekly as needed. Instructed patient she could take a dose tomorrow morning and Sunday morning if swelling doesn't go down and weight remains elevated above baseline. Also instructed her to call on Monday morning (10/20/24) with update and for further guidance on Lasix  dosage/frquency. She is also taking Jardiance  as prescribed, instructed to continue taking this medication. Patient verbalized understanding of the above.  Patient has hosp F/U appt scheduled for 10/27/2024.  Will forward to Dr. Ladona to review for further advisement.

## 2024-10-17 NOTE — Telephone Encounter (Signed)
 Pt c/o swelling/edema: STAT if pt has developed SOB within 24 hours  If swelling, where is the swelling located?   Ankles and legs  How much weight have you gained and in what time span?   Yes  Have you gained 2 pounds in a day or 5 pounds in a week?   5 pounds overnight  Do you have a log of your daily weights (if so, list)?   Yes  Are you currently taking a fluid pill?   Yes  Are you currently SOB?   No  Have you traveled recently in a car or plane for an extended period of time?   No  Caller Zoe [name]) stated patient noticed she had gained 5 pounds overnight and has re-started her furosemide  this morning.  Caller noted patient is now wearing compression socks and keeping her feet elevated.  Caller wants a call back to the patient directly.

## 2024-10-17 NOTE — Telephone Encounter (Signed)
 Patient returned call, transferred directly to triage nurse.  Spoke with patient, she was following up on recommendation from Dr. Ladona.  Shared response from Dr. Ladona: Bad food over holidays will cost her another admission.  Ask her to double up on her diuretics and take everyday instead of 3 days a week for 1 week. Stop eating treats!  Patient states she lives in a retirement community and requests her meals not have added salt, though she doesn't have control over the food that they serve outside of this request. Patient states she does her best to limit sodium intake.  Patient verbalized understanding to take Lasix  daily, along with potassium for 1 week. She will follow-up at appt on 10/27/24.

## 2024-10-27 ENCOUNTER — Telehealth: Payer: Self-pay

## 2024-10-27 ENCOUNTER — Ambulatory Visit: Attending: Nurse Practitioner | Admitting: Nurse Practitioner

## 2024-10-27 ENCOUNTER — Encounter: Payer: Self-pay | Admitting: Nurse Practitioner

## 2024-10-27 VITALS — BP 110/58 | HR 60 | Ht 64.0 in | Wt 172.0 lb

## 2024-10-27 DIAGNOSIS — I10 Essential (primary) hypertension: Secondary | ICD-10-CM

## 2024-10-27 DIAGNOSIS — I5032 Chronic diastolic (congestive) heart failure: Secondary | ICD-10-CM | POA: Diagnosis not present

## 2024-10-27 DIAGNOSIS — E039 Hypothyroidism, unspecified: Secondary | ICD-10-CM | POA: Diagnosis not present

## 2024-10-27 DIAGNOSIS — I350 Nonrheumatic aortic (valve) stenosis: Secondary | ICD-10-CM | POA: Diagnosis not present

## 2024-10-27 DIAGNOSIS — Z952 Presence of prosthetic heart valve: Secondary | ICD-10-CM | POA: Diagnosis not present

## 2024-10-27 DIAGNOSIS — N183 Chronic kidney disease, stage 3 unspecified: Secondary | ICD-10-CM | POA: Diagnosis not present

## 2024-10-27 DIAGNOSIS — E785 Hyperlipidemia, unspecified: Secondary | ICD-10-CM

## 2024-10-27 DIAGNOSIS — I480 Paroxysmal atrial fibrillation: Secondary | ICD-10-CM

## 2024-10-27 NOTE — Patient Instructions (Signed)
 Medication Instructions:  Please Stop Amiodarone    *If you need a refill on your cardiac medications before your next appointment, please call your pharmacy*  Lab Work: CBC, BMET today If you have labs (blood work) drawn today and your tests are completely normal, you will receive your results only by: MyChart Message (if you have MyChart) OR A paper copy in the mail If you have any lab test that is abnormal or we need to change your treatment, we will call you to review the results.  Testing/Procedures: NONE ordered at this time of appointment   Follow-Up: At [facility], you and your health needs are our priority.  As part of our continuing mission to provide you with exceptional heart care, our providers are all part of one team.  This team includes your primary Cardiologist (physician) and Advanced Practice Providers or APPs (Physician Assistants and Nurse Practitioners) who all work together to provide you with the care you need, when you need it.  Your next appointment:   2 month(s)  Provider:   Gordy Bergamo, MD    We recommend signing up for the patient portal called MyChart.  Sign up information is provided on this After Visit Summary.  MyChart is used to connect with patients for Virtual Visits (Telemedicine).  Patients are able to view lab/test results, encounter notes, upcoming appointments, etc.  Non-urgent messages can be sent to your provider as well.   To learn more about what you can do with MyChart, go to [email or web address].   Other Instructions Monitor Blood pressure. Take BP two hours after blood pressure medication is taken. Report systolic BP (top number) consistently less than 110.

## 2024-10-27 NOTE — Progress Notes (Unsigned)
 "  Office Visit    Patient Name: Zoe [name] Date of Encounter: 10/27/2024  Primary Care Provider:  Gerome Brunet, DO Primary Cardiologist:  Gordy Bergamo, MD  Chief Complaint    89 year old female with a history of  coronary vasospasm, chronic diastolic heart failure, paroxysmal atrial fibrillation, Stokes-Adams syncope, CHB s/p PPM, severe aortic stenosis s/p TAVR, bilateral carotid artery stenosis, TIA, hypertension, hyperlipidemia, CKD stage III, multiple myeloma, chronic normocytic anemia, hypothyroidism, and GERD who presents for hospital follow-up related to heart failure.  Past Medical History    Past Medical History:  Diagnosis Date   Arthritis    some - per patient   Atrial fibrillation (HCC)    Breast cancer (HCC)    breast cancer / left    Cataract    bilat    GERD (gastroesophageal reflux disease)    History of kidney stones    Hyperlipidemia    Hypertension    Hypothyroidism    Macular degeneration    Left   S/P TAVR (transcatheter aortic valve replacement) 09/03/2018   23 mm Edwards Sapien 3 transcatheter heart valve placed via percutaneous right transfemoral approach    Severe aortic stenosis    Stress incontinence    Thyroid  disease    Tinnitus    Past Surgical History:  Procedure Laterality Date   ABDOMINAL HYSTERECTOMY  1970's   BACK SURGERY     BREAST LUMPECTOMY  12/1998   lumpectomy   CARDIAC CATHETERIZATION     CARDIOVERSION N/A 04/18/2023   Procedure: CARDIOVERSION;  Surgeon: Michele Richardson, DO;  Location: [facility];  Service: Cardiovascular;  Laterality: N/A;   CARDIOVERSION N/A 04/20/2023   Procedure: CARDIOVERSION;  Surgeon: Bergamo Gordy, MD;  Location: [facility];  Service: Cardiovascular;  Laterality: N/A;   EYE SURGERY     cataract surgery bilat    INTRAOPERATIVE TRANSTHORACIC ECHOCARDIOGRAM N/A 09/03/2018   Procedure: INTRAOPERATIVE TRANSTHORACIC ECHOCARDIOGRAM;  Surgeon: Verlin Lonni BIRCH, MD;  Location: [facility];   Service: Open Heart Surgery;  Laterality: N/A;   KYPHOPLASTY N/A 09/07/2022   Procedure: THORACIC EIGHT KYPHOPLASTY;  Surgeon: Burnetta Aures, MD;  Location: [facility];  Service: Orthopedics;  Laterality: N/A;  1 hr Local with IV [facility] 3 C-Bed   LITHOTRIPSY     PACEMAKER IMPLANT N/A 01/09/2024   Procedure: PACEMAKER IMPLANT;  Surgeon: Waddell Danelle ORN, MD;  Location: [facility];  Service: Cardiovascular;  Laterality: N/A;   Right total knee     2018 Dr. Ernie   RIGHT/LEFT HEART CATH AND CORONARY ANGIOGRAPHY N/A 08/06/2018   Procedure: RIGHT/LEFT HEART CATH AND CORONARY ANGIOGRAPHY;  Surgeon: Bergamo Gordy, MD;  Location: [facility];  Service: Cardiovascular;  Laterality: N/A;   THYROIDECTOMY, PARTIAL  1975   TONSILLECTOMY     as a child - patient not sure of exact date   TOTAL KNEE ARTHROPLASTY Left 03/13/2016   Procedure: TOTAL KNEE ARTHROPLASTY;  Surgeon: Donnice Ernie, MD;  Location: [facility];  Service: Orthopedics;  Laterality: Left;   TOTAL KNEE ARTHROPLASTY Right 06/18/2017   Procedure: RIGHT TOTAL KNEE ARTHROPLASTY;  Surgeon: Ernie Donnice, MD;  Location: [facility];  Service: Orthopedics;  Laterality: Right;   TRANSCATHETER AORTIC VALVE REPLACEMENT, TRANSFEMORAL N/A 09/03/2018   Procedure: TRANSCATHETER AORTIC VALVE REPLACEMENT, TRANSFEMORAL;  Surgeon: Verlin Lonni BIRCH, MD;  Location: [facility];  Service: Open Heart Surgery;  Laterality: N/A;    Allergies  Allergies[1]   Labs/Other Studies Reviewed    The following studies  were reviewed today:  Cardiac Studies & Procedures   ______________________________________________________________________________________________     ECHOCARDIOGRAM  ECHOCARDIOGRAM COMPLETE 10/06/2024  Narrative ECHOCARDIOGRAM REPORT    Patient Name:   Zoe [name] Date of Exam: 10/06/2024 Medical Rec #:  [id number]           Height:       64.0 in Accession #:    [id number]          Weight:       187.4 lb Date of Birth:   [birth year]            BSA:          1.903 m Patient Age:    89 years            BP:           114/47 mmHg Patient Gender: F                   HR:           70 bpm. Exam Location:  Inpatient  Procedure: 2D Echo, Cardiac Doppler and Color Doppler (Both Spectral and Color Flow Doppler were utilized during procedure).  Indications:    ACHF- acute diastolic  History:        Patient has prior history of Echocardiogram examinations, most recent 01/09/2024. CHF, Pacemaker, Arrythmias:Atrial Fibrillation; Risk Factors:Hypertension and Dyslipidemia. Aortic Valve: 23 mm Sapien prosthetic, stented (TAVR) valve is present in the aortic position. Procedure Date: 09/03/2018.  Sonographer:    Merlynn Argyle Referring Phys: [id number] CAROLE N HALL  IMPRESSIONS   1. Left ventricular ejection fraction, by estimation, is 55 to 60%. The left ventricle has normal function. The left ventricle has no regional wall motion abnormalities. There is mild concentric left ventricular hypertrophy. Left ventricular diastolic parameters are consistent with Grade II diastolic dysfunction (pseudonormalization). Elevated left atrial pressure. 2. Right ventricular systolic function is normal. The right ventricular size is normal. There is mildly elevated pulmonary artery systolic pressure. The estimated right ventricular systolic pressure is 37.3 mmHg. 3. Left atrial size was moderately dilated. 4. The mitral valve is degenerative. Mild mitral valve regurgitation. Mild mitral stenosis. The mean mitral valve gradient is 4.0 mmHg with average heart rate of 65 bpm. Moderate to severe mitral annular calcification. 5. Prothetic valve well seated with calcified leaflet and hemodynamics concerning for prosthetic valve stenosis (PV 3.45 m/sec, MG 28 mmHg, and DVI 0.31). Trivial transvalvular regurgitation. The aortic valve has been repaired/replaced. Aortic valve regurgitation is trivial. There is a 23 mm Sapien prosthetic (TAVR) valve  present in the aortic position. Procedure Date: 09/03/2018.  Comparison(s): Prior images reviewed side by side. Gradient across prosthetic valve is higher.  FINDINGS Left Ventricle: Left ventricular ejection fraction, by estimation, is 55 to 60%. The left ventricle has normal function. The left ventricle has no regional wall motion abnormalities. The left ventricular internal cavity size was normal in size. There is mild concentric left ventricular hypertrophy. Left ventricular diastolic function could not be evaluated due to mitral annular calcification (moderate or greater). Left ventricular diastolic parameters are consistent with Grade II diastolic dysfunction (pseudonormalization). Elevated left atrial pressure.  Right Ventricle: The right ventricular size is normal. No increase in right ventricular wall thickness. Right ventricular systolic function is normal. There is mildly elevated pulmonary artery systolic pressure. The tricuspid regurgitant velocity is 2.93 m/s, and with an assumed right atrial pressure of 3 mmHg, the estimated right ventricular systolic pressure is 37.3 mmHg.  Left Atrium:  Left atrial size was moderately dilated.  Right Atrium: Right atrial size was normal in size.  Pericardium: There is no evidence of pericardial effusion.  Mitral Valve: The mitral valve is degenerative in appearance. Moderate to severe mitral annular calcification. Mild mitral valve regurgitation. Mild mitral valve stenosis. The mean mitral valve gradient is 4.0 mmHg with average heart rate of 65 bpm.  Tricuspid Valve: The tricuspid valve is normal in structure. Tricuspid valve regurgitation is not demonstrated. No evidence of tricuspid stenosis.  Aortic Valve: Prothetic valve well seated with calcified leaflet and hemodynamics concerning for prosthetic valve stenosis (PV 3.45 m/sec, MG 28 mmHg, and DVI 0.31). Trivial transvalvular regurgitation. The aortic valve has been repaired/replaced.  Aortic valve regurgitation is trivial. Aortic valve mean gradient measures 24.0 mmHg. Aortic valve peak gradient measures 40.5 mmHg. Aortic valve area, by VTI measures 0.76 cm. There is a 23 mm Sapien prosthetic, stented (TAVR) valve present in the aortic position. Procedure Date: 09/03/2018.  Pulmonic Valve: The pulmonic valve was not well visualized. Pulmonic valve regurgitation is not visualized.  Aorta: The aortic root and ascending aorta are structurally normal, with no evidence of dilitation.  IAS/Shunts: No atrial level shunt detected by color flow Doppler.   LEFT VENTRICLE PLAX 2D LVIDd:         4.10 cm   Diastology LVIDs:         2.90 cm   LV e' medial:    4.13 cm/s LV PW:         1.30 cm   LV E/e' medial:  35.6 LV IVS:        1.30 cm   LV e' lateral:   8.33 cm/s LVOT diam:     1.70 cm   LV E/e' lateral: 17.6 LV SV:         55 LV SV Index:   29 LVOT Area:     2.27 cm   RIGHT VENTRICLE             IVC RV Basal diam:  3.40 cm     IVC diam: 1.40 cm RV S prime:     11.50 cm/s TAPSE (M-mode): 1.9 cm  LEFT ATRIUM              Index        RIGHT ATRIUM           Index LA diam:        4.70 cm  2.47 cm/m   RA Area:     12.30 cm LA Vol (A2C):   106.0 ml 55.70 ml/m  RA Volume:   22.50 ml  11.82 ml/m LA Vol (A4C):   78.5 ml  41.25 ml/m LA Biplane Vol: 91.2 ml  47.93 ml/m AORTIC VALVE AV Area (Vmax):    0.65 cm AV Area (Vmean):   0.67 cm AV Area (VTI):     0.76 cm AV Vmax:           318.33 cm/s AV Vmean:          219.200 cm/s AV VTI:            0.730 m AV Peak Grad:      40.5 mmHg AV Mean Grad:      24.0 mmHg LVOT Vmax:         90.70 cm/s LVOT Vmean:        64.867 cm/s LVOT VTI:          0.244 m LVOT/AV VTI ratio: 0.33  AORTA  Ao Root diam: 3.00 cm Ao Asc diam:  3.00 cm  MITRAL VALVE                TRICUSPID VALVE MV Area (PHT): 3.34 cm     TR Peak grad:   34.3 mmHg MV Mean grad:  4.0 mmHg     TR Vmax:        293.00 cm/s MV Decel Time: 227 msec MV E  velocity: 147.00 cm/s  SHUNTS MV A velocity: 114.00 cm/s  Systemic VTI:  0.24 m MV E/A ratio:  1.29         Systemic Diam: 1.70 cm  Joelle Cedars Tonleu Electronically signed by Joelle Cedars Tonleu Signature Date/Time: 10/06/2024/6:07:11 PM    Final          ______________________________________________________________________________________________     Recent Labs: 01/09/2024: TSH 0.891 10/05/2024: ALT 10; B Natriuretic Peptide 608.5 10/07/2024: Hemoglobin 11.1; Magnesium  1.7; Platelets 226 10/08/2024: BUN 35; Creatinine, Ser 1.49; Potassium 3.6; Pro Brain Natriuretic Peptide 587.0; Sodium 135  Recent Lipid Panel    Component Value Date/Time   CHOL 159 04/01/2024 0433   CHOL 103 07/16/2019 0941   TRIG 56 04/01/2024 0433   HDL 64 04/01/2024 0433   HDL 48 07/16/2019 0941   CHOLHDL 2.5 04/01/2024 0433   VLDL 11 04/01/2024 0433   LDLCALC 84 04/01/2024 0433   LDLCALC 39 07/16/2019 0941   LDLDIRECT 48 07/16/2019 0941    History of Present Illness    89 year old female with the above past medical history including coronary vasospasm, chronic diastolic heart failure, paroxysmal atrial fibrillation, Stokes-Adams syncope, CHB s/p PPM, severe aortic stenosis s/p TAVR, bilateral carotid artery stenosis, TIA, hypertension, hyperlipidemia, CKD stage III, multiple myeloma, chronic normocytic anemia, hypothyroidism, and GERD.  She has a history of mild coronary artery spasm by coronary angiography in 2014.  She also has a history of severe aortic stenosis s/p TAVR in 2019.  Cardiac catheterization prior to TAVR showed no evidence of coronary artery disease. She was hospitalized in summer 2024 in the setting of A-fib with RVR.  She was hospitalized in 12/2023 in the setting of complete heart block s/p PPM in 12/2023, followed by EP.  Echocardiogram in 12/2023 showed EF 60 to 65%, G2 DD, normal RV systolic function, severely dilated left atrium, mild mitral valve regurgitation, mild aortic  valve regurgitation with stable TAVR.  She was hospitalized in June 2025 in the setting of TIA.  She was last seen in the office on 08/27/2024 and was stable from a cardiac standpoint.  She was hospitalized in December 2025 in the setting of acute hypoxemic respiratory failure secondary to acute on chronic diastolic heart failure.  Echocardiogram showed EF 55 to 60%, normal LV function, no RWMA, mild concentric LVH, G2 DD, normal RV systolic function, mildly elevated PASP, moderately dilated left atrium, mild mitral valve regurgitation, mild mitral stenosis, moderate to severe MAC, trivial aortic valve regurgitation, well-seated prosthetic aortic valve with calcified leaflet, hemodynamics concerning for prosthetic valve stenosis, mean gradient 28 mmHg).  Chest x-ray concerning for chronic infiltrates.  Cardiology was consulted. Amiodarone  was held.  She was diuresed with IV Lasix .  It was noted that should she continue to have heart failure symptoms or worsening dyspnea, she may be a candidate for repeat TAVR.  She was discharged home in stable condition on 10/08/2024.  She contacted our office on 10/17/2024 with concern for increased bilateral lower extremity edema, weight gain. She was advised to double her Lasix  dose and  take Lasix  daily for 1 week.  She presents today for follow-up accompanied by her friend.  Since her hospitalization and since she contacted our office Stable from a cardiac standpoint.  She has not taken her Lasix  for several days.  She is stable and trace nonpitting bilateral lower extremity edema.  She denies any palpitations, dizziness, dyspnea, PND, orthopnea, weight gain.  She denies symptoms concerning for angina.  She does report some mild vaginal itching.  She states this was present prior to starting Jardiance .  Recommend follow-up with PCP.  BP is low in office today, improved with recheck.  She states she took her evening medications early, just prior to her appointment today.   Continue to monitor BP and report SBP consistently less than 110 mmHg.  Generally euvolemic on exam.  Denies bleeding.  Will check CBC, BMET.  Follow-up in 2 months with Dr. Ladona.  Stable dyspnea on exertion.  Appears to be some confusion regarding her medications, we have done her best to reconcile her medications at today's visit.  1. Chronic diastolic heart failure: Echocardiogram showed EF 55 to 60%, normal LV function, no RWMA, mild concentric LVH, G2 DD, normal RV systolic function, mildly elevated PASP, moderately dilated left atrium, mild mitral valve regurgitation, mild mitral stenosis, moderate to severe MAC, trivial aortic valve regurgitation, well-seated prosthetic aortic valve with calcified leaflet, hemodynamics concerning for prosthetic valve stenosis, mean gradient 28 mmHg). She contacted our office on 10/17/2024 with concern for increased bilateral lower extremity edema, weight gain.  She was advised to double her Lasix  dose and take Lasix  daily for 1 week. Euvolemic and well compensated on exam. Continue   2. Severe aortic stenosis: Recent echo in 09/2024 showed well-seated prosthetic aortic valve with calcified leaflet, hemodynamics concerning for prosthetic valve stenosis, mean gradient 28 mmHg).  It was noted that should she continue to have heart failure symptoms or worsening dyspnea, she may be a candidate for repeat TAVR.   3. Paroxysmal atrial fibrillation: Maintaining NSR. Most recent device interrogation showed 1 episode of atrial fibrillation/atrial flutter, rate controlled, duration 1 hour to 40 minutes. Continue Eliquis .   4. CHB s/p PPM: Most recent device interrogation 08/2024 showed normal device function. Followed by EP.   5. Bilateral carotid artery stenosis: Carotid ultrasound in 5/25 revealed 1 to 39% B ICA stenosis.  Follow-up study recommended in 12 months. Asymptomatic. Continue   6. Hypertension: BP well controlled. Continue current antihypertensive regimen.    7. Hyperlipidemia: LDL was 84 in 03/2024.  8. CKD stage III: Creatinine was 1.49 on 10/08/2024.  9. Hypothyroidism: TSH was   10. Disposition: Follow-up in   Home Medications    Current Outpatient Medications  Medication Sig Dispense Refill   acetaminophen  (TYLENOL ) 500 MG tablet Take 1,000 mg by mouth every 6 (six) hours as needed.     acyclovir  (ZOVIRAX ) 400 MG tablet Take 400 mg by mouth 2 (two) times daily.     amLODipine  (NORVASC ) 5 MG tablet TAKE 1 TABLET BY MOUTH AT BEDTIME 90 tablet 3   apixaban  (ELIQUIS ) 5 MG TABS tablet Take 1 tablet (5 mg total) by mouth 2 (two) times daily. 180 tablet 1   Cholecalciferol  25 MCG (1000 UT) TBDP Take 1,000 Units by mouth in the morning.     cyanocobalamin  1000 MCG tablet Take 1,000 mcg by mouth in the morning.     Docusate Sodium  100 MG capsule Take 100 mg by mouth as needed for constipation.     empagliflozin  (JARDIANCE )  10 MG TABS tablet Take 1 tablet (10 mg total) by mouth daily. 30 tablet 1   furosemide  (LASIX ) 20 MG tablet Three times a week as needed; take potassium if taking lasix      gabapentin  (NEURONTIN ) 100 MG capsule Take 1 capsule (100 mg total) by mouth 3 (three) times daily. 90 capsule 0   levothyroxine  (SYNTHROID , LEVOTHROID) 100 MCG tablet Take 100 mcg by mouth daily before breakfast.  2   losartan  (COZAAR ) 50 MG tablet Take 1 tablet (50 mg total) by mouth daily. Take at night (Patient taking differently: Take 50 mg by mouth at bedtime.)     metoprolol  succinate (TOPROL -XL) 50 MG 24 hr tablet Take 50 mg by mouth daily. Take with or immediately following a meal. Take in the Morning     Multiple Vitamins-Minerals (PRESERVISION AREDS) CAPS Take 1 capsule by mouth 2 (two) times daily.     oxycodone  (OXY-IR) 5 MG capsule Take 5 mg by mouth every 12 (twelve) hours as needed for pain.     pantoprazole  (PROTONIX ) 40 MG tablet Take 1 tablet (40 mg total) by mouth daily.     polyethylene glycol (MIRALAX  / GLYCOLAX ) 17 g packet Take 17 g  by mouth daily.     potassium chloride  SA (KLOR-CON  M) 20 MEQ tablet Three times a week as needed only if taking lasix      rosuvastatin  (CRESTOR ) 20 MG tablet Take 1 tablet (20 mg total) by mouth daily. 90 tablet 3   lenalidomide  (REVLIMID ) 5 MG capsule Take 1 capsule (5 mg total) by mouth daily. Take 1 capsule daily for 21 days and then none for 7 days. 21 capsule 0   No current facility-administered medications for this visit.     Review of Systems    ***.  All other systems reviewed and are otherwise negative except as noted above.    Physical Exam    VS:  BP (!) 110/58   Pulse 60   Ht 5' 4 (1.626 m)   Wt 172 lb (78 kg)   BMI 29.52 kg/m  GEN: Well nourished, well developed, in no acute distress. HEENT: normal. Neck: Supple, no JVD, carotid bruits, or masses. Cardiac: RRR, no murmurs, rubs, or gallops. No clubbing, cyanosis, edema.  Radials/DP/PT 2+ and equal bilaterally.  Respiratory:  Respirations regular and unlabored, clear to auscultation bilaterally. GI: Soft, nontender, nondistended, BS + x 4. MS: no deformity or atrophy. Skin: warm and dry, no rash. Neuro:  Strength and sensation are intact. Psych: Normal affect.  Accessory Clinical Findings    ECG personally reviewed by me today - EKG Interpretation Date/Time:  Monday October 27 2024 15:40:24 EST Ventricular Rate:  60 PR Interval:  216 QRS Duration:  104 QT Interval:  482 QTC Calculation: 482 R Axis:   -18  Text Interpretation: AV dual-paced rhythm with prolonged AV conduction When compared with ECG of 05-Oct-2024 03:02, PREVIOUS ECG IS PRESENT Confirmed by Daneen Perkins ([id number]) on 10/27/2024 3:41:12 PM  - no acute changes.   Lab Results  Component Value Date   WBC 8.2 10/07/2024   HGB 11.1 (L) 10/07/2024   HCT 34.7 (L) 10/07/2024   MCV 87.2 10/07/2024   PLT 226 10/07/2024   Lab Results  Component Value Date   CREATININE 1.49 (H) 10/08/2024   BUN 35 (H) 10/08/2024   NA 135 10/08/2024   K 3.6  10/08/2024   CL 96 (L) 10/08/2024   CO2 27 10/08/2024   Lab Results  Component Value Date  ALT 10 10/05/2024   AST 17 10/05/2024   ALKPHOS 69 10/05/2024   BILITOT 1.3 (H) 10/05/2024   Lab Results  Component Value Date   CHOL 159 04/01/2024   HDL 64 04/01/2024   LDLCALC 84 04/01/2024   LDLDIRECT 48 07/16/2019   TRIG 56 04/01/2024   CHOLHDL 2.5 04/01/2024    Lab Results  Component Value Date   HGBA1C 5.1 03/31/2024    Assessment & Plan    1.  ***      Damien JAYSON Braver, NP 10/27/2024, 4:22 PM       [1]  Allergies Allergen Reactions   Quinolones Other (See Comments)    Patient on Amiodarone  and can prolong QT   Penicillins Other (See Comments)    UNSPECIFIED REACTION  Patient does not remember reaction.  Has patient had a PCN reaction causing immediate rash, facial/tongue/throat swelling, SOB or lightheadedness with hypotension: no Has patient had a PCN reaction causing severe rash involving mucus membranes or skin necrosis: no Has patient had a PCN reaction that required hospitalization no Has patient had a PCN reaction occurring within the last 10 years: no If all of the above answers are NO, then may proceed with Cephalosporin use.    Sulfa Antibiotics Other (See Comments)    UNSPECIFIED REACTION  maybe vision issues?    "

## 2024-10-27 NOTE — Telephone Encounter (Signed)
 Left a message for pts nurse at [place] at [facility], to discuss current medications. Some of the medications on pts medication list she provided weren't on pts med list. Will verify the medications pt is receiving. Waiting on a return call.

## 2024-10-28 LAB — BASIC METABOLIC PANEL WITH GFR
BUN/Creatinine Ratio: 13 (ref 12–28)
BUN: 18 mg/dL (ref 8–27)
CO2: 19 mmol/L — ABNORMAL LOW (ref 20–29)
Calcium: 8.5 mg/dL — ABNORMAL LOW (ref 8.7–10.3)
Chloride: 105 mmol/L (ref 96–106)
Creatinine, Ser: 1.37 mg/dL — ABNORMAL HIGH (ref 0.57–1.00)
Glucose: 97 mg/dL (ref 70–99)
Potassium: 4 mmol/L (ref 3.5–5.2)
Sodium: 139 mmol/L (ref 134–144)
eGFR: 37 mL/min/1.73 — ABNORMAL LOW

## 2024-10-28 LAB — CBC
Hematocrit: 33.3 % — ABNORMAL LOW (ref 34.0–46.6)
Hemoglobin: 10.3 g/dL — ABNORMAL LOW (ref 11.1–15.9)
MCH: 27.2 pg (ref 26.6–33.0)
MCHC: 30.9 g/dL — ABNORMAL LOW (ref 31.5–35.7)
MCV: 88 fL (ref 79–97)
Platelets: 162 x10E3/uL (ref 150–450)
RBC: 3.79 x10E6/uL (ref 3.77–5.28)
RDW: 16.4 % — ABNORMAL HIGH (ref 11.7–15.4)
WBC: 5.6 x10E3/uL (ref 3.4–10.8)

## 2024-10-29 ENCOUNTER — Other Ambulatory Visit: Payer: Self-pay | Admitting: *Deleted

## 2024-10-29 ENCOUNTER — Encounter: Payer: Self-pay | Admitting: Nurse Practitioner

## 2024-10-29 ENCOUNTER — Ambulatory Visit: Payer: Self-pay | Admitting: Nurse Practitioner

## 2024-10-29 DIAGNOSIS — C9000 Multiple myeloma not having achieved remission: Secondary | ICD-10-CM

## 2024-10-29 MED ORDER — LENALIDOMIDE 5 MG PO CAPS: 5.0000 mg | ORAL_CAPSULE | Freq: Every day | ORAL | 0 refills | Status: DC

## 2024-11-02 ENCOUNTER — Inpatient Hospital Stay (HOSPITAL_COMMUNITY)
Admission: EM | Admit: 2024-11-02 | Discharge: 2024-11-06 | DRG: 286 | Disposition: A | Discharge status: Home / Self Care | Attending: Internal Medicine | Admitting: Internal Medicine

## 2024-11-02 DIAGNOSIS — E039 Hypothyroidism, unspecified: Secondary | ICD-10-CM | POA: Diagnosis present

## 2024-11-02 DIAGNOSIS — T82857A Stenosis of cardiac prosthetic devices, implants and grafts, initial encounter: Secondary | ICD-10-CM | POA: Diagnosis present

## 2024-11-02 DIAGNOSIS — Z952 Presence of prosthetic heart valve: Secondary | ICD-10-CM | POA: Insufficient documentation

## 2024-11-02 DIAGNOSIS — I2721 Secondary pulmonary arterial hypertension: Secondary | ICD-10-CM | POA: Diagnosis present

## 2024-11-02 DIAGNOSIS — Z7901 Long term (current) use of anticoagulants: Secondary | ICD-10-CM

## 2024-11-02 DIAGNOSIS — Z90710 Acquired absence of both cervix and uterus: Secondary | ICD-10-CM

## 2024-11-02 DIAGNOSIS — Z953 Presence of xenogenic heart valve: Secondary | ICD-10-CM

## 2024-11-02 DIAGNOSIS — Z1152 Encounter for screening for COVID-19: Secondary | ICD-10-CM

## 2024-11-02 DIAGNOSIS — G620 Drug-induced polyneuropathy: Secondary | ICD-10-CM | POA: Diagnosis present

## 2024-11-02 DIAGNOSIS — T451X5A Adverse effect of antineoplastic and immunosuppressive drugs, initial encounter: Secondary | ICD-10-CM | POA: Diagnosis present

## 2024-11-02 DIAGNOSIS — Z833 Family history of diabetes mellitus: Secondary | ICD-10-CM

## 2024-11-02 DIAGNOSIS — N183 Chronic kidney disease, stage 3 unspecified: Secondary | ICD-10-CM | POA: Diagnosis present

## 2024-11-02 DIAGNOSIS — I342 Nonrheumatic mitral (valve) stenosis: Secondary | ICD-10-CM

## 2024-11-02 DIAGNOSIS — D638 Anemia in other chronic diseases classified elsewhere: Secondary | ICD-10-CM | POA: Diagnosis present

## 2024-11-02 DIAGNOSIS — Z950 Presence of cardiac pacemaker: Secondary | ICD-10-CM

## 2024-11-02 DIAGNOSIS — Z79899 Other long term (current) drug therapy: Secondary | ICD-10-CM

## 2024-11-02 DIAGNOSIS — Z853 Personal history of malignant neoplasm of breast: Secondary | ICD-10-CM

## 2024-11-02 DIAGNOSIS — Z7984 Long term (current) use of oral hypoglycemic drugs: Secondary | ICD-10-CM

## 2024-11-02 DIAGNOSIS — D509 Iron deficiency anemia, unspecified: Secondary | ICD-10-CM | POA: Diagnosis present

## 2024-11-02 DIAGNOSIS — N1832 Chronic kidney disease, stage 3b: Secondary | ICD-10-CM | POA: Diagnosis present

## 2024-11-02 DIAGNOSIS — E785 Hyperlipidemia, unspecified: Secondary | ICD-10-CM | POA: Diagnosis present

## 2024-11-02 DIAGNOSIS — Z8249 Family history of ischemic heart disease and other diseases of the circulatory system: Secondary | ICD-10-CM

## 2024-11-02 DIAGNOSIS — Y831 Surgical operation with implant of artificial internal device as the cause of abnormal reaction of the patient, or of later complication, without mention of misadventure at the time of the procedure: Secondary | ICD-10-CM | POA: Diagnosis present

## 2024-11-02 DIAGNOSIS — C9000 Multiple myeloma not having achieved remission: Secondary | ICD-10-CM | POA: Diagnosis present

## 2024-11-02 DIAGNOSIS — R059 Cough, unspecified: Secondary | ICD-10-CM | POA: Diagnosis present

## 2024-11-02 DIAGNOSIS — J9601 Acute respiratory failure with hypoxia: Secondary | ICD-10-CM | POA: Diagnosis present

## 2024-11-02 DIAGNOSIS — Z888 Allergy status to other drugs, medicaments and biological substances status: Secondary | ICD-10-CM

## 2024-11-02 DIAGNOSIS — I951 Orthostatic hypotension: Secondary | ICD-10-CM | POA: Diagnosis present

## 2024-11-02 DIAGNOSIS — Z79890 Hormone replacement therapy (postmenopausal): Secondary | ICD-10-CM

## 2024-11-02 DIAGNOSIS — Z880 Allergy status to penicillin: Secondary | ICD-10-CM

## 2024-11-02 DIAGNOSIS — I4892 Unspecified atrial flutter: Secondary | ICD-10-CM | POA: Diagnosis present

## 2024-11-02 DIAGNOSIS — F32A Depression, unspecified: Secondary | ICD-10-CM | POA: Diagnosis present

## 2024-11-02 DIAGNOSIS — Z8673 Personal history of transient ischemic attack (TIA), and cerebral infarction without residual deficits: Secondary | ICD-10-CM

## 2024-11-02 DIAGNOSIS — N179 Acute kidney failure, unspecified: Secondary | ICD-10-CM | POA: Diagnosis not present

## 2024-11-02 DIAGNOSIS — D696 Thrombocytopenia, unspecified: Secondary | ICD-10-CM | POA: Diagnosis present

## 2024-11-02 DIAGNOSIS — I130 Hypertensive heart and chronic kidney disease with heart failure and stage 1 through stage 4 chronic kidney disease, or unspecified chronic kidney disease: Principal | ICD-10-CM | POA: Diagnosis present

## 2024-11-02 DIAGNOSIS — M25531 Pain in right wrist: Secondary | ICD-10-CM | POA: Diagnosis present

## 2024-11-02 DIAGNOSIS — Z882 Allergy status to sulfonamides status: Secondary | ICD-10-CM

## 2024-11-02 DIAGNOSIS — K219 Gastro-esophageal reflux disease without esophagitis: Secondary | ICD-10-CM | POA: Diagnosis present

## 2024-11-02 DIAGNOSIS — M109 Gout, unspecified: Secondary | ICD-10-CM | POA: Diagnosis present

## 2024-11-02 DIAGNOSIS — Z96653 Presence of artificial knee joint, bilateral: Secondary | ICD-10-CM | POA: Diagnosis present

## 2024-11-02 DIAGNOSIS — R079 Chest pain, unspecified: Principal | ICD-10-CM

## 2024-11-02 DIAGNOSIS — R7989 Other specified abnormal findings of blood chemistry: Secondary | ICD-10-CM | POA: Diagnosis present

## 2024-11-02 DIAGNOSIS — I5033 Acute on chronic diastolic (congestive) heart failure: Secondary | ICD-10-CM | POA: Diagnosis present

## 2024-11-02 DIAGNOSIS — I480 Paroxysmal atrial fibrillation: Secondary | ICD-10-CM | POA: Diagnosis present

## 2024-11-02 DIAGNOSIS — Z86000 Personal history of in-situ neoplasm of breast: Secondary | ICD-10-CM

## 2024-11-02 DIAGNOSIS — I10 Essential (primary) hypertension: Secondary | ICD-10-CM | POA: Diagnosis present

## 2024-11-02 DIAGNOSIS — I350 Nonrheumatic aortic (valve) stenosis: Secondary | ICD-10-CM

## 2024-11-02 NOTE — ED Triage Notes (Signed)
 Pt here from home with c/o chest pain and some slight sob , pt received 1 nitroglycerin  and refused asa , hx of chf, paced rhythm on EKG

## 2024-11-03 ENCOUNTER — Telehealth: Payer: Self-pay | Admitting: *Deleted

## 2024-11-03 ENCOUNTER — Telehealth: Payer: Self-pay

## 2024-11-03 ENCOUNTER — Encounter (HOSPITAL_COMMUNITY): Payer: Self-pay | Admitting: Emergency Medicine

## 2024-11-03 ENCOUNTER — Other Ambulatory Visit: Payer: Self-pay

## 2024-11-03 ENCOUNTER — Emergency Department (HOSPITAL_COMMUNITY)

## 2024-11-03 DIAGNOSIS — Z950 Presence of cardiac pacemaker: Secondary | ICD-10-CM | POA: Diagnosis not present

## 2024-11-03 DIAGNOSIS — I350 Nonrheumatic aortic (valve) stenosis: Secondary | ICD-10-CM

## 2024-11-03 DIAGNOSIS — I442 Atrioventricular block, complete: Secondary | ICD-10-CM | POA: Diagnosis not present

## 2024-11-03 DIAGNOSIS — I342 Nonrheumatic mitral (valve) stenosis: Secondary | ICD-10-CM

## 2024-11-03 DIAGNOSIS — I5033 Acute on chronic diastolic (congestive) heart failure: Secondary | ICD-10-CM

## 2024-11-03 DIAGNOSIS — Z952 Presence of prosthetic heart valve: Secondary | ICD-10-CM | POA: Diagnosis not present

## 2024-11-03 DIAGNOSIS — R059 Cough, unspecified: Secondary | ICD-10-CM | POA: Diagnosis present

## 2024-11-03 DIAGNOSIS — I480 Paroxysmal atrial fibrillation: Secondary | ICD-10-CM | POA: Diagnosis not present

## 2024-11-03 LAB — CBC WITH DIFFERENTIAL/PLATELET
Abs Immature Granulocytes: 0.01 K/uL (ref 0.00–0.07)
Basophils Absolute: 0.1 K/uL (ref 0.0–0.1)
Basophils Relative: 1 %
Eosinophils Absolute: 0.4 K/uL (ref 0.0–0.5)
Eosinophils Relative: 8 %
HCT: 30 % — ABNORMAL LOW (ref 36.0–46.0)
Hemoglobin: 9.2 g/dL — ABNORMAL LOW (ref 12.0–15.0)
Immature Granulocytes: 0 %
Lymphocytes Relative: 28 %
Lymphs Abs: 1.2 K/uL (ref 0.7–4.0)
MCH: 27.5 pg (ref 26.0–34.0)
MCHC: 30.7 g/dL (ref 30.0–36.0)
MCV: 89.8 fL (ref 80.0–100.0)
Monocytes Absolute: 0.8 K/uL (ref 0.1–1.0)
Monocytes Relative: 19 %
Neutro Abs: 1.9 K/uL (ref 1.7–7.7)
Neutrophils Relative %: 44 %
Platelets: 141 K/uL — ABNORMAL LOW (ref 150–400)
RBC: 3.34 MIL/uL — ABNORMAL LOW (ref 3.87–5.11)
RDW: 17.8 % — ABNORMAL HIGH (ref 11.5–15.5)
WBC: 4.3 K/uL (ref 4.0–10.5)
nRBC: 0 % (ref 0.0–0.2)

## 2024-11-03 LAB — COMPREHENSIVE METABOLIC PANEL WITH GFR
ALT: 6 U/L (ref 0–44)
AST: 15 U/L (ref 15–41)
Albumin: 3.3 g/dL — ABNORMAL LOW (ref 3.5–5.0)
Alkaline Phosphatase: 88 U/L (ref 38–126)
Anion gap: 10 (ref 5–15)
BUN: 21 mg/dL (ref 8–23)
CO2: 22 mmol/L (ref 22–32)
Calcium: 8.3 mg/dL — ABNORMAL LOW (ref 8.9–10.3)
Chloride: 106 mmol/L (ref 98–111)
Creatinine, Ser: 1.12 mg/dL — ABNORMAL HIGH (ref 0.44–1.00)
GFR, Estimated: 47 mL/min — ABNORMAL LOW
Glucose, Bld: 93 mg/dL (ref 70–99)
Potassium: 4 mmol/L (ref 3.5–5.1)
Sodium: 139 mmol/L (ref 135–145)
Total Bilirubin: 0.7 mg/dL (ref 0.0–1.2)
Total Protein: 6.1 g/dL — ABNORMAL LOW (ref 6.5–8.1)

## 2024-11-03 LAB — TROPONIN T, HIGH SENSITIVITY
Troponin T High Sensitivity: 30 ng/L — ABNORMAL HIGH (ref 0–19)
Troponin T High Sensitivity: 31 ng/L — ABNORMAL HIGH (ref 0–19)

## 2024-11-03 LAB — PROCALCITONIN: Procalcitonin: <0.1 ng/mL

## 2024-11-03 LAB — RESP PANEL BY RT-PCR (RSV, FLU A&B, COVID)  RVPGX2
Influenza A by PCR: NEGATIVE
Influenza B by PCR: NEGATIVE
Resp Syncytial Virus by PCR: NEGATIVE
SARS Coronavirus 2 by RT PCR: NEGATIVE

## 2024-11-03 LAB — PRO BRAIN NATRIURETIC PEPTIDE: Pro Brain Natriuretic Peptide: 1478 pg/mL — ABNORMAL HIGH

## 2024-11-03 LAB — PHOSPHORUS: Phosphorus: 3.8 mg/dL (ref 2.5–4.6)

## 2024-11-03 LAB — MAGNESIUM: Magnesium: 1.6 mg/dL — ABNORMAL LOW (ref 1.7–2.4)

## 2024-11-03 MED ORDER — HYDRALAZINE HCL 20 MG/ML IJ SOLN: 10.0000 mg | INTRAMUSCULAR | Status: DC | PRN

## 2024-11-03 MED ORDER — LENALIDOMIDE 5 MG PO CAPS: 5.0000 mg | ORAL_CAPSULE | Freq: Every day | ORAL | Status: DC

## 2024-11-03 MED ORDER — ONDANSETRON HCL 4 MG/2ML IJ SOLN: 4.0000 mg | Freq: Four times a day (QID) | INTRAMUSCULAR | Status: DC | PRN

## 2024-11-03 MED ORDER — OXYCODONE HCL 5 MG PO TABS: 5.0000 mg | ORAL_TABLET | ORAL | Status: DC | PRN

## 2024-11-03 MED ORDER — SODIUM CHLORIDE 0.9% FLUSH
3.0000 mL | Freq: Two times a day (BID) | INTRAVENOUS | Status: DC
  Administered 2024-11-03 – 2024-11-05 (×5): 3 mL via INTRAVENOUS

## 2024-11-03 MED ORDER — ONDANSETRON HCL 4 MG PO TABS: 4.0000 mg | ORAL_TABLET | Freq: Four times a day (QID) | ORAL | Status: DC | PRN

## 2024-11-03 MED ORDER — MAGNESIUM SULFATE 2 GM/50ML IV SOLN
2.0000 g | Freq: Once | INTRAVENOUS | Status: AC
  Administered 2024-11-03: 2 g via INTRAVENOUS
  Filled 2024-11-03: qty 50

## 2024-11-03 MED ORDER — SODIUM CHLORIDE 0.9% FLUSH
3.0000 mL | Freq: Two times a day (BID) | INTRAVENOUS | Status: DC
  Administered 2024-11-03 – 2024-11-04 (×2): 3 mL via INTRAVENOUS

## 2024-11-03 MED ORDER — FUROSEMIDE 10 MG/ML IJ SOLN
40.0000 mg | Freq: Two times a day (BID) | INTRAMUSCULAR | Status: DC
  Administered 2024-11-03 – 2024-11-04 (×4): 40 mg via INTRAVENOUS
  Filled 2024-11-03 (×5): qty 4

## 2024-11-03 MED ORDER — ROSUVASTATIN CALCIUM 20 MG PO TABS
20.0000 mg | ORAL_TABLET | Freq: Every day | ORAL | Status: DC
  Administered 2024-11-03 – 2024-11-05 (×3): 20 mg via ORAL
  Filled 2024-11-03 (×3): qty 1

## 2024-11-03 MED ORDER — FLEET ENEMA RE ENEM: 1.0000 | ENEMA | Freq: Once | RECTAL | Status: DC | PRN

## 2024-11-03 MED ORDER — ACETAMINOPHEN 650 MG RE SUPP: 650.0000 mg | Freq: Four times a day (QID) | RECTAL | Status: DC | PRN

## 2024-11-03 MED ORDER — IPRATROPIUM BROMIDE 0.02 % IN SOLN: 0.5000 mg | Freq: Four times a day (QID) | RESPIRATORY_TRACT | Status: DC | PRN

## 2024-11-03 MED ORDER — AMLODIPINE BESYLATE 5 MG PO TABS: 5.0000 mg | ORAL_TABLET | Freq: Every day | ORAL | Status: DC

## 2024-11-03 MED ORDER — AMLODIPINE BESYLATE 2.5 MG PO TABS: 2.5000 mg | ORAL_TABLET | Freq: Every day | ORAL | Status: DC

## 2024-11-03 MED ORDER — GUAIFENESIN ER 600 MG PO TB12: 600.0000 mg | ORAL_TABLET | Freq: Two times a day (BID) | ORAL | Status: DC

## 2024-11-03 MED ORDER — VITAMIN B-12 1000 MCG PO TABS
1000.0000 ug | ORAL_TABLET | Freq: Every morning | ORAL | Status: DC
  Administered 2024-11-03 – 2024-11-06 (×4): 1000 ug via ORAL
  Filled 2024-11-03 (×4): qty 1

## 2024-11-03 MED ORDER — POTASSIUM CHLORIDE CRYS ER 20 MEQ PO TBCR
20.0000 meq | EXTENDED_RELEASE_TABLET | ORAL | Status: DC
  Administered 2024-11-04: 20 meq via ORAL
  Filled 2024-11-03: qty 1

## 2024-11-03 MED ORDER — SENNOSIDES-DOCUSATE SODIUM 8.6-50 MG PO TABS: 1.0000 | ORAL_TABLET | Freq: Every evening | ORAL | Status: DC | PRN

## 2024-11-03 MED ORDER — TRAZODONE HCL 50 MG PO TABS: 25.0000 mg | ORAL_TABLET | Freq: Every evening | ORAL | Status: DC | PRN

## 2024-11-03 MED ORDER — PANTOPRAZOLE SODIUM 40 MG PO TBEC
40.0000 mg | DELAYED_RELEASE_TABLET | Freq: Every day | ORAL | Status: DC
  Administered 2024-11-03 – 2024-11-06 (×3): 40 mg via ORAL
  Filled 2024-11-03 (×3): qty 1

## 2024-11-03 MED ORDER — PRESERVISION AREDS PO CAPS: 1.0000 | ORAL_CAPSULE | Freq: Two times a day (BID) | ORAL | Status: DC

## 2024-11-03 MED ORDER — EMPAGLIFLOZIN 10 MG PO TABS
10.0000 mg | ORAL_TABLET | Freq: Every day | ORAL | Status: DC
  Administered 2024-11-03 – 2024-11-05 (×3): 10 mg via ORAL
  Filled 2024-11-03 (×3): qty 1

## 2024-11-03 MED ORDER — HYDROMORPHONE HCL 1 MG/ML IJ SOLN
0.5000 mg | INTRAMUSCULAR | Status: DC | PRN
  Administered 2024-11-03: 1 mg via INTRAVENOUS
  Filled 2024-11-03: qty 1

## 2024-11-03 MED ORDER — SODIUM CHLORIDE 0.9 % IV SOLN: INTRAVENOUS | Status: DC

## 2024-11-03 MED ORDER — LOSARTAN POTASSIUM 50 MG PO TABS
50.0000 mg | ORAL_TABLET | Freq: Every evening | ORAL | Status: DC
  Administered 2024-11-04: 50 mg via ORAL
  Filled 2024-11-03: qty 1

## 2024-11-03 MED ORDER — ACETAMINOPHEN 325 MG PO TABS: 650.0000 mg | ORAL_TABLET | Freq: Four times a day (QID) | ORAL | Status: DC | PRN

## 2024-11-03 MED ORDER — AZITHROMYCIN 250 MG PO TABS
500.0000 mg | ORAL_TABLET | Freq: Every day | ORAL | Status: DC
  Administered 2024-11-03 – 2024-11-04 (×2): 500 mg via ORAL
  Filled 2024-11-03 (×2): qty 2

## 2024-11-03 MED ORDER — FUROSEMIDE 10 MG/ML IJ SOLN
40.0000 mg | Freq: Once | INTRAMUSCULAR | Status: AC
  Administered 2024-11-03: 40 mg via INTRAVENOUS
  Filled 2024-11-03: qty 4

## 2024-11-03 MED ORDER — BISACODYL 5 MG PO TBEC: 5.0000 mg | DELAYED_RELEASE_TABLET | Freq: Every day | ORAL | Status: DC | PRN

## 2024-11-03 MED ORDER — LOSARTAN POTASSIUM 50 MG PO TABS: 50.0000 mg | ORAL_TABLET | Freq: Every day | ORAL | Status: DC

## 2024-11-03 MED ORDER — FUROSEMIDE 10 MG/ML IJ SOLN: 20.0000 mg | Freq: Two times a day (BID) | INTRAMUSCULAR | Status: DC

## 2024-11-03 MED ORDER — GUAIFENESIN 100 MG/5ML PO LIQD
10.0000 mL | Freq: Three times a day (TID) | ORAL | Status: DC
  Administered 2024-11-03 – 2024-11-06 (×9): 10 mL via ORAL
  Filled 2024-11-03 (×9): qty 10

## 2024-11-03 MED ORDER — BENZONATATE 100 MG PO CAPS: 200.0000 mg | ORAL_CAPSULE | Freq: Three times a day (TID) | ORAL | Status: DC | PRN

## 2024-11-03 MED ORDER — VITAMIN D 25 MCG (1000 UNIT) PO TABS
1000.0000 [IU] | ORAL_TABLET | Freq: Every morning | ORAL | Status: DC
  Administered 2024-11-03 – 2024-11-06 (×4): 1000 [IU] via ORAL
  Filled 2024-11-03 (×4): qty 1

## 2024-11-03 MED ORDER — HEPARIN SODIUM (PORCINE) 5000 UNIT/ML IJ SOLN: 5000.0000 [IU] | Freq: Three times a day (TID) | INTRAMUSCULAR | Status: DC

## 2024-11-03 MED ORDER — ACYCLOVIR 400 MG PO TABS
400.0000 mg | ORAL_TABLET | Freq: Two times a day (BID) | ORAL | Status: DC
  Administered 2024-11-03 – 2024-11-06 (×6): 400 mg via ORAL
  Filled 2024-11-03 (×8): qty 1

## 2024-11-03 MED ORDER — POLYETHYLENE GLYCOL 3350 17 G PO PACK
17.0000 g | PACK | Freq: Every day | ORAL | Status: DC
  Administered 2024-11-03 – 2024-11-04 (×2): 17 g via ORAL
  Filled 2024-11-03 (×3): qty 1

## 2024-11-03 MED ORDER — MELATONIN 3 MG PO TABS: 3.0000 mg | ORAL_TABLET | Freq: Every evening | ORAL | Status: DC | PRN

## 2024-11-03 MED ORDER — GABAPENTIN 100 MG PO CAPS
100.0000 mg | ORAL_CAPSULE | Freq: Three times a day (TID) | ORAL | Status: DC
  Administered 2024-11-03 – 2024-11-06 (×9): 100 mg via ORAL
  Filled 2024-11-03 (×9): qty 1

## 2024-11-03 MED ORDER — HYDROCOD POLI-CHLORPHE POLI ER 10-8 MG/5ML PO SUER
5.0000 mL | Freq: Two times a day (BID) | ORAL | Status: DC
  Administered 2024-11-03 – 2024-11-06 (×7): 5 mL via ORAL
  Filled 2024-11-03 (×7): qty 5

## 2024-11-03 MED ORDER — METOPROLOL SUCCINATE ER 50 MG PO TB24
50.0000 mg | ORAL_TABLET | Freq: Every day | ORAL | Status: DC
  Administered 2024-11-03 – 2024-11-06 (×4): 50 mg via ORAL
  Filled 2024-11-03 (×2): qty 1
  Filled 2024-11-03: qty 2
  Filled 2024-11-03: qty 1

## 2024-11-03 MED ORDER — APIXABAN 5 MG PO TABS
5.0000 mg | ORAL_TABLET | Freq: Two times a day (BID) | ORAL | Status: DC
  Administered 2024-11-03 – 2024-11-04 (×4): 5 mg via ORAL
  Filled 2024-11-03 (×5): qty 1

## 2024-11-03 MED ORDER — LEVOTHYROXINE SODIUM 100 MCG PO TABS
100.0000 ug | ORAL_TABLET | Freq: Every day | ORAL | Status: DC
  Administered 2024-11-03 – 2024-11-06 (×4): 100 ug via ORAL
  Filled 2024-11-03 (×4): qty 1

## 2024-11-03 MED ORDER — ACETAMINOPHEN 325 MG PO TABS
650.0000 mg | ORAL_TABLET | Freq: Four times a day (QID) | ORAL | Status: DC | PRN
  Administered 2024-11-04 – 2024-11-05 (×2): 650 mg via ORAL
  Filled 2024-11-03 (×2): qty 2

## 2024-11-03 NOTE — Assessment & Plan Note (Deleted)
 HFpEF  Acute on chronic diastolic congestive heart failure exacerbation Associate with cardiovascular complication, aortic stenosis, TAVR, A-fib, -Presenting with shortness of breath, volume overload -BNP 1478.0 >>   -Last echo:12/25-EF 55-60%, normal LV function, moderate concentric LVH, grade 2 diastolic dysfunction, left atrial enlargement, elevated pulmonary systolic pressure, MR, degenerative, right ventricle size normal  Extensive history of aortic stenosis, s/p TAVR On last admission was taken off amiodarone  due to (visual changes. -Continue home medication including: Metoprolol , losartan , Eliquis , Jardiance , (Lasix  20 mg every other day - Initiating IV Lasix  40 mg daily for now - Monitoring I's and O's, daily weight

## 2024-11-03 NOTE — Assessment & Plan Note (Signed)
 At baseline monitoring

## 2024-11-03 NOTE — Assessment & Plan Note (Signed)
 Monitor closely, stable Continue empiric acyclovir , Lenalidomide 

## 2024-11-03 NOTE — Assessment & Plan Note (Signed)
 Continue Neurontin

## 2024-11-03 NOTE — Care Management Obs Status (Signed)
 MEDICARE OBSERVATION STATUS NOTIFICATION   Patient Details  Name: Zoe [name] MRN: [id number] Date of Birth: 10-03-1934   Medicare Observation Status Notification Given:  Yes    Nena LITTIE Coffee, RN 11/03/2024, 6:02 PM

## 2024-11-03 NOTE — Assessment & Plan Note (Signed)
 BUN and creatinine at baseline  - Monitor closely in the face of aggressive diuresis Lab Results  Component Value Date   CREATININE 1.12 (H) 11/03/2024   CREATININE 1.37 (H) 10/27/2024   CREATININE 1.49 (H) 10/08/2024

## 2024-11-03 NOTE — H&P (Signed)
 " History and Physical   Patient: Zoe [name]                            PCP: Gerome Brunet, DO                    DOB: Sep 01, 1934            DOA: 11/02/2024 FMW:[id number]             DOS: 11/03/2024, 9:47 AM  Gerome Brunet, DO  Patient coming from:   HOME  I have personally reviewed patient's medical records, in electronic medical records, including:  [facility], and care everywhere.    Chief Complaint:   Chief Complaint  Patient presents with   Chest Pain    History of present illness:     Zoe [name] is a 89 year old female with history of diastolic  CHF, severe AS-  S/P TAVR (2019), bilateral carotid stenosis, TIAs, A-fib (Eliquis ), s/p PPM, GERD, HTN, HLD, CKD IIIb hypothyroidism, multiple myeloma anemia of chronic disease Presented with shortness of breath, cough clear sputum production and intermittent chest pain.   Recent hospitalization 10/05/24 - 10/08/24 for acute on chronic diastolic congestive heart failure exacerbation.  Also was treated with antibiotics azithromycin /Rocephin . Was discharged on Lasix  20 mg 3 times per week   ED Evaluation: Blood pressure 107/78, pulse 65, temperature 98 F (36.7 C), temperature source Oral, resp. rate 17, height 5' 4 (1.626 m), weight 78 kg, SpO2 95%. LABs: Hemoglobin 9.2, hematocrit 30.0, platelets 141, creatinine 1.12, magnesium  1.6, Procalcitonin <0.10  proBNP 1778, (recent 570) Troponin 30, >> 31, EKG without ischemic changes Chest x-ray: Increased interstitial marking, right upper lobe predominant?  Atypical infection  In ED started on IV Lasix     Patient Denies having: Fever, Chills, Abd pain, N/V/D, headache, dizziness, lightheadedness,  Dysuria, Joint pain, rash, open wounds     Review of Systems: As per HPI, otherwise 10 point review of systems were negative.    ----------------------------------------------------------------------------------------------------------------------  Allergies[1]  Home MEDs:  Prior to Admission medications  Medication Sig Start Date End Date Taking? Authorizing Provider  acetaminophen  (TYLENOL ) 500 MG tablet Take 1,000 mg by mouth every 6 (six) hours as needed for mild pain (pain score 1-3).   Yes [provider]  acyclovir  (ZOVIRAX ) 400 MG tablet Take 400 mg by mouth 2 (two) times daily. 09/24/24  Yes [provider]  amLODipine  (NORVASC ) 5 MG tablet TAKE 1 TABLET BY MOUTH AT BEDTIME 09/23/24  Yes Ladona Heinz, MD  apixaban  (ELIQUIS ) 5 MG TABS tablet Take 1 tablet (5 mg total) by mouth 2 (two) times daily. 11/15/23  Yes Ladona Heinz, MD  Cholecalciferol  25 MCG (1000 UT) TBDP Take 1,000 Units by mouth in the morning.   Yes [provider]  cyanocobalamin  1000 MCG tablet Take 1,000 mcg by mouth in the morning.   Yes [provider]  empagliflozin  (JARDIANCE ) 10 MG TABS tablet Take 1 tablet (10 mg total) by mouth daily. 10/08/24  Yes Ladona Heinz, MD  furosemide  (LASIX ) 20 MG tablet Three times a week as needed; take potassium if taking lasix  Patient taking differently: Take 20 mg by mouth every other day. Three times a week as needed; take potassium if taking lasix  04/01/24  Yes Patsy Lenis, MD  gabapentin  (NEURONTIN ) 100 MG capsule Take 1 capsule (100 mg total) by mouth 3 (three) times daily. 10/08/24  Yes Pokhrel, Vernal, MD  lenalidomide  (REVLIMID ) 5 MG capsule Take 1 capsule (5 mg total) by mouth daily. Auth #  [id number]  Date obtained: 10/29/2024 Take 1 capsule daily for 21 days and then none for 7 days. 10/29/24  Yes Federico Norleen ONEIDA MADISON, MD  levothyroxine  (SYNTHROID , LEVOTHROID) 100 MCG tablet Take 100 mcg by mouth daily before breakfast. 12/29/15  Yes [provider]  losartan  (COZAAR ) 50 MG tablet Take 1 tablet (50 mg total) by mouth daily. Take at night Patient taking differently:  Take 50 mg by mouth at bedtime. 08/27/24  Yes Meng, Hao, PA  metoprolol  succinate (TOPROL -XL) 50 MG 24 hr tablet Take 50 mg by mouth daily. Take with or immediately following a meal. Take in the Morning   Yes [provider]  Multiple Vitamins-Minerals (PRESERVISION AREDS) CAPS Take 1 capsule by mouth 2 (two) times daily.   Yes [provider]  oxycodone  (OXY-IR) 5 MG capsule Take 5 mg by mouth every 12 (twelve) hours as needed for pain.   Yes [provider]  pantoprazole  (PROTONIX ) 40 MG tablet Take 1 tablet (40 mg total) by mouth daily. 04/01/24  Yes Patsy Lenis, MD  polyethylene glycol (MIRALAX  / GLYCOLAX ) 17 g packet Take 17 g by mouth daily.   Yes [provider]  potassium chloride  SA (KLOR-CON  M) 20 MEQ tablet Three times a week as needed only if taking lasix  Patient taking differently: Take 20 mEq by mouth every other day. Three times a week as needed only if taking lasix  04/01/24  Yes Patsy Lenis, MD  rosuvastatin  (CRESTOR ) 20 MG tablet Take 1 tablet (20 mg total) by mouth daily. 04/02/24  Yes Patsy Lenis, MD    PRN MEDs: acetaminophen  **OR** acetaminophen , benzonatate , hydrALAZINE , HYDROmorphone  (DILAUDID ) injection, ipratropium, melatonin, ondansetron  **OR** ondansetron  (ZOFRAN ) IV, oxyCODONE , senna-docusate, sodium phosphate , traZODone   Past Medical History:  Diagnosis Date   Arthritis    some - per patient   Atrial fibrillation (HCC)    Breast cancer (HCC)    breast cancer / left    Cataract    bilat    GERD (gastroesophageal reflux disease)    History of kidney stones    Hyperlipidemia    Hypertension    Hypothyroidism    Macular degeneration    Left   S/P TAVR (transcatheter aortic valve replacement) 09/03/2018   23 mm Edwards Sapien 3 transcatheter heart valve placed via percutaneous right transfemoral approach    Severe aortic stenosis    Stress incontinence    Thyroid  disease    Tinnitus     Past Surgical History:   Procedure Laterality Date   ABDOMINAL HYSTERECTOMY  1970's   BACK SURGERY     BREAST LUMPECTOMY  12/1998   lumpectomy   CARDIAC CATHETERIZATION     CARDIOVERSION N/A 04/18/2023   Procedure: CARDIOVERSION;  Surgeon: Michele Richardson, DO;  Location: [facility] INVASIVE CV [facility];  Service: Cardiovascular;  Laterality: N/A;   CARDIOVERSION N/A 04/20/2023   Procedure: CARDIOVERSION;  Surgeon: Ladona Heinz, MD;  Location: [facility];  Service: Cardiovascular;  Laterality: N/A;   EYE SURGERY     cataract surgery bilat    INTRAOPERATIVE TRANSTHORACIC ECHOCARDIOGRAM N/A 09/03/2018   Procedure: INTRAOPERATIVE TRANSTHORACIC ECHOCARDIOGRAM;  Surgeon: Verlin Lonni BIRCH, MD;  Location[facility];  Service: Open Heart Surgery;  Laterality: N/A;   KYPHOPLASTY N/A 09/07/2022   Procedure: THORACIC EIGHT KYPHOPLASTY;  Surgeon: Burnetta Aures, MD;  Location: [facility] OR;  Service: Orthopedics;  Laterality: N/A;  1 hr Local with IV  Regional 3 C-Bed   LITHOTRIPSY     PACEMAKER IMPLANT N/A 01/09/2024   Procedure: PACEMAKER IMPLANT;  Surgeon: Waddell Danelle ORN, MD;  Location[facility] INVASIVE CV LAB;  Service: Cardiovascular;  Laterality: N/A;   Right total knee     2018 Dr. Ernie   RIGHT/LEFT HEART CATH AND CORONARY ANGIOGRAPHY N/A 08/06/2018   Procedure: RIGHT/LEFT HEART CATH AND CORONARY ANGIOGRAPHY;  Surgeon: Ladona Heinz, MD;  Location: [facility] INV[facility]IVE CV [facility];  Service: Cardiovascular;  Laterality: N/A;   THYROIDECTOMY, PARTIAL  1975   TONSILLECTOMY     as a child - patient not sure of exact date   TOTAL KNEE ARTHROPLASTY Left 03/13/2016   Procedure: TOTAL KNEE ARTHROPLASTY;  Surgeon: Donnice Ernie, MD;  Location: [facility] ORS;  Service: Orthopedics;  Laterality: Left;   TOTAL KNEE ARTHROPLASTY Right 06/18/2017   Procedure: RIGHT TOTAL KNEE ARTHROPLASTY;  Surgeon: Ernie Donnice, MD;  Location: [facility] ORS;  Service: Orthopedics;  Laterality: Right;   TRANSCATHETER AORTIC VALVE REPLACEMENT, TRANSFEMORAL N/A 09/03/2018   Procedure:  TRANSCATHETER AORTIC VALVE REPLACEMENT, TRANSFEMORAL;  Surgeon: Verlin Lonni BIRCH, MD;  Location: [facility] OR;  Service: Open Heart Surgery;  Laterality: N/A;     reports that she has never smoked. She has never used smokeless tobacco. She reports that she does not drink alcohol  and does not use drugs.   Family History  Problem Relation Age of Onset   Diabetes Mother    Stroke Mother        Carotid artery disease   Heart disease Father        CAD   Coronary artery disease Father    Diabetes Sister     Physical Exam:   Vitals:   11/03/24 0600 11/03/24 0630 11/03/24 0931 11/03/24 0947  BP:  107/78 (!) 135/56   Pulse:  65 87   Resp:  17 17   Temp: 98 F (36.7 C)   97.6 F (36.4 C)  TempSrc: Oral     SpO2:  95% 95%   Weight:      Height:       Constitutional: NAD, calm, comfortable Eyes: PERRL, lids and conjunctivae normal ENMT: Mucous membranes are moist. Posterior pharynx clear of any exudate or lesions.Normal dentition.  Neck: normal, supple, no masses, no thyromegaly Respiratory: clear to auscultation bilaterally, no wheezing, no crackles. Normal respiratory effort. No accessory muscle use.  Cardiovascular: Regular rate and rhythm, no murmurs / rubs / gallops. No extremity edema. 2+ pedal pulses. No carotid bruits.  Abdomen: no tenderness, no masses palpated. No hepatosplenomegaly. Bowel sounds positive.  Musculoskeletal: no clubbing / cyanosis. No joint deformity upper and lower extremities. Good ROM, no contractures. Normal muscle tone.  Neurologic: CN II-XII grossly intact. Sensation intact, DTR normal. Strength 5/5 in all 4.  Psychiatric: Normal judgment and insight. Alert and oriented x 3. Normal mood.  Skin: no rashes, lesions, ulcers. No induration n         Labs on admission:    I have personally reviewed following labs and imaging studies  CBC: Recent Labs  Lab 10/27/24 1650 11/03/24 0030  WBC 5.6 4.3  NEUTROABS  --  1.9  HGB 10.3* 9.2*  HCT  33.3* 30.0*  MCV 88 89.8  PLT 162 141*   Basic Metabolic Panel: Recent Labs  Lab 10/27/24 1650 11/03/24 0030 11/03/24 0220  NA 139 139  --   K 4.0 4.0  --   CL 105 106  --   CO2 19* 22  --  GLUCOSE 97 93  --   BUN 18 21  --   CREATININE 1.37* 1.12*  --   CALCIUM  8.5* 8.3*  --   MG  --   --  1.6*  PHOS  --   --  3.8   GFR: Estimated Creatinine Clearance: 35.1 mL/min (A) (by C-G formula based on SCr of 1.12 mg/dL (H)). Liver Function Tests: Recent Labs  Lab 11/03/24 0030  AST 15  ALT 6  ALKPHOS 88  BILITOT 0.7  PROT 6.1*  ALBUMIN 3.3*   BNP (last 3 results) Recent Labs    10/08/24 0655 11/03/24 0030  PROBNP 587.0* 1,478.0*    Urine analysis:    Component Value Date/Time   COLORURINE STRAW (A) 01/08/2024 1557   APPEARANCEUR CLEAR 01/08/2024 1557   LABSPEC 1.005 01/08/2024 1557   PHURINE 7.0 01/08/2024 1557   GLUCOSEU NEGATIVE 01/08/2024 1557   HGBUR NEGATIVE 01/08/2024 1557   BILIRUBINUR NEGATIVE 01/08/2024 1557   BILIRUBINUR negative 07/21/2023 1252   KETONESUR NEGATIVE 01/08/2024 1557   PROTEINUR NEGATIVE 01/08/2024 1557   UROBILINOGEN 0.2 07/21/2023 1252   NITRITE NEGATIVE 01/08/2024 1557   LEUKOCYTESUR NEGATIVE 01/08/2024 1557    Last A1C:  Lab Results  Component Value Date   HGBA1C 5.1 03/31/2024     Radiologic Exams on Admission:   DG Chest Port 1 View Result Date: 11/03/2024 EXAM: 1 VIEW(S) XRAY OF THE CHEST 11/03/2024 12:24:13 AM COMPARISON: 10/08/2024 CLINICAL HISTORY: chest pain FINDINGS: LINES, TUBES AND DEVICES: Left subclavian pacemaker. LUNGS AND PLEURA: Increased interstitial markings, right upper lobe predominant, suggesting residual atypical infection/pneumonia. No pleural effusion. No pneumothorax. HEART AND MEDIASTINUM: Cardiomegaly. Thoracic atherosclerosis. BONES AND SOFT TISSUES: No acute osseous abnormality. IMPRESSION: 1. Increased interstitial markings, right upper lobe predominant, suggesting residual atypical  infection/pneumonia. Electronically signed by: Pinkie Pebbles MD MD 11/03/2024 12:28 AM EST RP Workstation: [id number]    EKG:   Independently reviewed.  Orders placed or performed during the hospital encounter of 11/02/24   EKG 12-Lead   EKG 12-Lead   ED EKG   ED EKG   EKG 12-Lead   EKG 12-Lead   EKG 12-Lead   *Note: Due to a large number of results and/or encounters for the requested time period, some results have not been displayed. A complete set of results can be found in Results Review.   ---------------------------------------------------------------------------------------------------------------------------------------    Assessment / Plan:   Principal Problem:   Acute on chronic diastolic heart failure (HCC) Active Problems:   History of breast cancer, DCIS, lumpectomy March 2000   S/P TAVR (transcatheter aortic valve replacement)   Hypertension   GERD (gastroesophageal reflux disease)   Elevated troponin   Atrial fib/flutter, transient (HCC)   CKD (chronic kidney disease) stage 3, GFR 30-59 ml/min (HCC)   Hyperlipidemia   Microcytic anemia   Multiple myeloma (HCC)   Chemotherapy-induced neuropathy   Acute kidney injury superimposed on chronic kidney disease   Hypothyroidism   History of stroke   Cough   Assessment and Plan: * Acute on chronic diastolic heart failure (HCC) HFpEF  Acute on chronic diastolic congestive heart failure exacerbation Associate with cardiovascular complication, aortic stenosis, TAVR, A-fib, -Presenting with shortness of breath, volume overload -BNP 1478.0 >>   -Last echo:12/25-EF 55-60%, normal LV function, moderate concentric LVH, grade 2 diastolic dysfunction, left atrial enlargement, elevated pulmonary systolic pressure, MR, degenerative, right ventricle size normal  Extensive history of aortic stenosis, s/p TAVR On last admission was taken off amiodarone  due to (visual changes. -Continue  home medication including:  Metoprolol , losartan , Eliquis , Jardiance , (Lasix  20 mg every other day - Initiating IV Lasix  40 mg daily for now - Monitoring I's and O's, daily weight       CKD (chronic kidney disease) stage 3, GFR 30-59 ml/min (HCC) At baseline monitoring  Atrial fib/flutter, transient (HCC) Continue metoprolol , Eliquis  Rongeuring electrolytes, maintaining magnesium  > 2.0 , potassium >4.0  Elevated troponin Monitoring troponin -Likely ischemic demand in the setting of CHF exacerbation -Chest pain has resolved -Continue current meds  GERD (gastroesophageal reflux disease) Continue PPI  Hypertension Continue: Metoprolol , losartan , Currently on IV Lasix , monitoring BP closely  S/P TAVR (transcatheter aortic valve replacement) Stable,  appreciate further evaluation recommendation by cardiology  History of breast cancer, DCIS, lumpectomy March 2000 Noted in remission  Acute kidney injury superimposed on chronic kidney disease BUN and creatinine at baseline  - Monitor closely in the face of aggressive diuresis Lab Results  Component Value Date   CREATININE 1.12 (H) 11/03/2024   CREATININE 1.37 (H) 10/27/2024   CREATININE 1.49 (H) 10/08/2024     Chemotherapy-induced neuropathy Continue Neurontin   Multiple myeloma (HCC) Monitor closely, stable Continue empiric acyclovir , Lenalidomide    Microcytic anemia Microcytic anemia in the setting of acute on chronic anemia of chronic disease -Monitoring H&H closely -Stable  Hyperlipidemia Continue rosuvastatin   History of stroke Continue secondary prevention, statins, Eliquis ,  Hypothyroidism Continue Synthroid   Cough Continue complain of cough, sometimes productive-yellowish-green -Chest x-ray :Increased interstitial markings, right upper lobe predominant, suggesting residual atypical infection/pneumonia. -Stable afebrile, normotensive, with no leukocytosis -Initiating empiric antibiotics azithromycin  -As needed Mucinex ,  Tussionex        Consults called:  None -------------------------------------------------------------------------------------------------------------------------------------------- DVT prophylaxis:  SCDs Start: 11/03/24 0725 SCDs Start: 11/03/24 0339 apixaban  (ELIQUIS ) tablet 5 mg   Code Status:   Code Status: Full Code   Admission status: Patient will be admitted as Observation, with a greater than 2 midnight length of stay. Level of care: Progressive   Family Communication:  none at bedside  (The above findings and plan of care has been discussed with patient in detail, the patient expressed understanding and agreement of above plan)  --------------------------------------------------------------------------------------------------------------------------------------------------  Disposition Plan:  Anticipated 1-2 days Status is: Observation The patient remains OBS appropriate and will d/c before 2 midnights.     ----------------------------------------------------------------------------------------------------------------------------------------------------  Time spent:  79  Min.  Was spent seeing and evaluating the patient, reviewing all medical records, drawn plan of care.  SIGNED: Adriana DELENA Grams, MD, FHM. FAAFP. [facility], Pager  (Please use [email or web address] to page/ or secure chat through epic) If 7PM-7AM, please contact night-coverage [email or web address],  11/03/2024, 9:47 AM     [1]  Allergies Allergen Reactions   Quinolones Other (See Comments)    Patient on Amiodarone  and can prolong QT   Penicillins Other (See Comments)    UNSPECIFIED REACTION  Patient does not remember reaction.  Has patient had a PCN reaction causing immediate rash, facial/tongue/throat swelling, SOB or lightheadedness with hypotension: no Has patient had a PCN reaction causing severe rash involving mucus membranes or skin necrosis: no Has patient had a PCN reaction  that required hospitalization no Has patient had a PCN reaction occurring within the last 10 years: no If all of the above answers are NO, then may proceed with Cephalosporin use.    Sulfa Antibiotics Other (See Comments)    UNSPECIFIED REACTION  maybe vision issues?    "

## 2024-11-03 NOTE — Assessment & Plan Note (Signed)
 Continue complain of cough, sometimes productive-yellowish-green -Chest x-ray :Increased interstitial markings, right upper lobe predominant, suggesting residual atypical infection/pneumonia. -Stable afebrile, normotensive, with no leukocytosis -Initiating empiric antibiotics azithromycin  -As needed Mucinex , Tussionex

## 2024-11-03 NOTE — Telephone Encounter (Signed)
 Received vm message from pt. She states she is currently in the ED @ [facility] for chest pain. It appears fromchart notes that she will be admitted. She is asking to cancel her appts here tomorrow.  Appts cancelled.

## 2024-11-03 NOTE — Assessment & Plan Note (Signed)
 Continue secondary prevention, statins, Eliquis ,

## 2024-11-03 NOTE — Progress Notes (Signed)
" ° °  Brief Progress Note   _____________________________________________________________________________________________________________  Patient Name: Zoe  B [name] Patient DOB: August 01, 1934 Date: @TODAY @      Data: Reviewed labs, notes, VS.    Action: No action needed at this time.      Response:    _____________________________________________________________________________________________________________  [facility] RN Expeditor Sharolyn JONETTA Batman Please contact us  directly via secure chat (search for [facility]) or by calling us  at [phone] [facility]).  "

## 2024-11-03 NOTE — Assessment & Plan Note (Signed)
-   Continue rosuvastatin

## 2024-11-03 NOTE — Assessment & Plan Note (Signed)
 Stable,  appreciate further evaluation recommendation by cardiology

## 2024-11-03 NOTE — ED Provider Notes (Signed)
 " [place] EMERGENCY DEPARTMENT AT [facility] Provider Note   CSN: [id number] Arrival date & time: 11/02/24  2359     Patient presents with: Chest Pain   Zoe [name] is a 89 y.o. female.   Presents to the emergency department for evaluation of chest pain.  Patient reports that she went to bed around 9 and then at some point tonight woke up and noticed pain under her left breast.  Patient reports a history of congestive heart failure and a pacemaker, no history of heart attack.  She reports that she is feeling somewhat improved now.  Patient indicates that she has had similar episodes in the past, has come to the ER for it but was never diagnosed.       Prior to Admission medications  Medication Sig Start Date End Date Taking? Authorizing Provider  acetaminophen  (TYLENOL ) 500 MG tablet Take 1,000 mg by mouth every 6 (six) hours as needed.    [provider]  acyclovir  (ZOVIRAX ) 400 MG tablet Take 400 mg by mouth 2 (two) times daily. 09/24/24   [provider]  amLODipine  (NORVASC ) 5 MG tablet TAKE 1 TABLET BY MOUTH AT BEDTIME 09/23/24   Ladona Heinz, MD  apixaban  (ELIQUIS ) 5 MG TABS tablet Take 1 tablet (5 mg total) by mouth 2 (two) times daily. 11/15/23   Ladona Heinz, MD  Cholecalciferol  25 MCG (1000 UT) TBDP Take 1,000 Units by mouth in the morning.    [provider]  cyanocobalamin  1000 MCG tablet Take 1,000 mcg by mouth in the morning.    [provider]  Docusate Sodium  100 MG capsule Take 100 mg by mouth as needed for constipation.    [provider]  empagliflozin  (JARDIANCE ) 10 MG TABS tablet Take 1 tablet (10 mg total) by mouth daily. 10/08/24   Ladona Heinz, MD  furosemide  (LASIX ) 20 MG tablet Three times a week as needed; take potassium if taking lasix  04/01/24   Patsy Lenis, MD  gabapentin  (NEURONTIN ) 100 MG capsule Take 1 capsule (100 mg total) by mouth 3 (three) times daily. 10/08/24   Pokhrel, Laxman, MD   lenalidomide  (REVLIMID ) 5 MG capsule Take 1 capsule (5 mg total) by mouth daily. Auth #  [id number]  Date obtained: 10/29/2024 Take 1 capsule daily for 21 days and then none for 7 days. 10/29/24   Federico Norleen ONEIDA MADISON, MD  levothyroxine  (SYNTHROID , LEVOTHROID) 100 MCG tablet Take 100 mcg by mouth daily before breakfast. 12/29/15   [provider]  losartan  (COZAAR ) 50 MG tablet Take 1 tablet (50 mg total) by mouth daily. Take at night Patient taking differently: Take 50 mg by mouth at bedtime. 08/27/24   Meng, Hao, PA  metoprolol  succinate (TOPROL -XL) 50 MG 24 hr tablet Take 50 mg by mouth daily. Take with or immediately following a meal. Take in the Morning    [provider]  Multiple Vitamins-Minerals (PRESERVISION AREDS) CAPS Take 1 capsule by mouth 2 (two) times daily.    [provider]  oxycodone  (OXY-IR) 5 MG capsule Take 5 mg by mouth every 12 (twelve) hours as needed for pain.    [provider]  pantoprazole  (PROTONIX ) 40 MG tablet Take 1 tablet (40 mg total) by mouth daily. 04/01/24   Patsy Lenis, MD  polyethylene glycol (MIRALAX  / GLYCOLAX ) 17 g packet Take 17 g by mouth daily.    [provider]  potassium chloride  SA (KLOR-CON  M) 20 MEQ tablet Three times a week as  needed only if taking lasix  04/01/24   Patsy Lenis, MD  rosuvastatin  (CRESTOR ) 20 MG tablet Take 1 tablet (20 mg total) by mouth daily. 04/02/24   Patsy Lenis, MD    Allergies: Quinolones, Penicillins, and Sulfa antibiotics    Review of Systems  Updated Vital Signs BP 130/67 (BP Location: Right Arm)   Pulse 60   Temp 98.6 F (37 C) (Oral)   Resp 17   SpO2 97%   Physical Exam Vitals and nursing note reviewed.  Constitutional:      General: She is not in acute distress.    Appearance: She is well-developed.  HENT:     Head: Normocephalic and atraumatic.     Mouth/Throat:     Mouth: Mucous membranes are moist.  Eyes:     General: Vision grossly intact. Gaze aligned  appropriately.     Extraocular Movements: Extraocular movements intact.     Conjunctiva/sclera: Conjunctivae normal.  Cardiovascular:     Rate and Rhythm: Normal rate and regular rhythm.     Pulses: Normal pulses.     Heart sounds: Normal heart sounds, S1 normal and S2 normal. No murmur heard.    No friction rub. No gallop.  Pulmonary:     Effort: Pulmonary effort is normal. No respiratory distress.     Breath sounds: Normal breath sounds.  Chest:     Chest wall: Tenderness present.    Abdominal:     General: Bowel sounds are normal.     Palpations: Abdomen is soft.     Tenderness: There is no abdominal tenderness. There is no guarding or rebound.     Hernia: No hernia is present.  Musculoskeletal:        General: No swelling.     Cervical back: Full passive range of motion without pain, normal range of motion and neck supple. No spinous process tenderness or muscular tenderness. Normal range of motion.     Right lower leg: No edema.     Left lower leg: No edema.  Skin:    General: Skin is warm and dry.     Capillary Refill: Capillary refill takes less than 2 seconds.     Findings: No ecchymosis, erythema, rash or wound.  Neurological:     General: No focal deficit present.     Mental Status: She is alert and oriented to person, place, and time.     GCS: GCS eye subscore is 4. GCS verbal subscore is 5. GCS motor subscore is 6.     Cranial Nerves: Cranial nerves 2-12 are intact.     Sensory: Sensation is intact.     Motor: Motor function is intact.     Coordination: Coordination is intact.  Psychiatric:        Attention and Perception: Attention normal.        Mood and Affect: Mood normal.        Speech: Speech normal.        Behavior: Behavior normal.     (all labs ordered are listed, but only abnormal results are displayed) Labs Reviewed  CBC WITH DIFFERENTIAL/PLATELET - Abnormal; Notable for the following components:      Result Value   RBC 3.34 (*)    Hemoglobin  9.2 (*)    HCT 30.0 (*)    RDW 17.8 (*)    Platelets 141 (*)    All other components within normal limits  COMPREHENSIVE METABOLIC PANEL WITH GFR - Abnormal; Notable for the following components:  Creatinine, Ser 1.12 (*)    Calcium  8.3 (*)    Total Protein 6.1 (*)    Albumin 3.3 (*)    GFR, Estimated 47 (*)    All other components within normal limits  PRO BRAIN NATRIURETIC PEPTIDE - Abnormal; Notable for the following components:   Pro Brain Natriuretic Peptide 1,478.0 (*)    All other components within normal limits  TROPONIN T, HIGH SENSITIVITY - Abnormal; Notable for the following components:   Troponin T High Sensitivity 30 (*)    All other components within normal limits  TROPONIN T, HIGH SENSITIVITY - Abnormal; Notable for the following components:   Troponin T High Sensitivity 31 (*)    All other components within normal limits    EKG: EKG Interpretation Date/Time:  Monday November 03 2024 00:12:32 EST Ventricular Rate:  62 PR Interval:  218 QRS Duration:  130 QT Interval:  498 QTC Calculation: 506 R Axis:   -28  Text Interpretation: AV dual-paced complexes Confirmed by Haze Lonni PARAS ([id number]) on 11/03/2024 12:52:02 AM  Radiology: [facility] Chest Port 1 View Result Date: 11/03/2024 EXAM: 1 VIEW(S) XRAY OF THE CHEST 11/03/2024 12:24:13 AM COMPARISON: 10/08/2024 CLINICAL HISTORY: chest pain FINDINGS: LINES, TUBES AND DEVICES: Left subclavian pacemaker. LUNGS AND PLEURA: Increased interstitial markings, right upper lobe predominant, suggesting residual atypical infection/pneumonia. No pleural effusion. No pneumothorax. HEART AND MEDIASTINUM: Cardiomegaly. Thoracic atherosclerosis. BONES AND SOFT TISSUES: No acute osseous abnormality. IMPRESSION: 1. Increased interstitial markings, right upper lobe predominant, suggesting residual atypical infection/pneumonia. Electronically signed by: Pinkie Pebbles MD MD 11/03/2024 12:28 AM EST RP Workstation: [id number]      Procedures   Medications Ordered in the ED  furosemide  (LASIX ) injection 40 mg (has no administration in time range)                                    Medical Decision Making Amount and/or Complexity of Data Reviewed External Data Reviewed: labs, radiology, ECG and notes. Labs: ordered. Decision-making details documented in ED Course. Radiology: ordered and independent interpretation performed. Decision-making details documented in ED Course. ECG/medicine tests: ordered and independent interpretation performed. Decision-making details documented in ED Course.  Risk Prescription drug management.   Differential Diagnosis considered includes, but not limited to: STEMI; NSTEMI; myocarditis; pericarditis; pulmonary embolism; aortic dissection; pneumothorax; pneumonia; gastritis; musculoskeletal pain  Presents to the emergency department for evaluation of chest pain and shortness of breath.  Patient went to bed around 9 PM and then today woke up with pain in the left side of the chest.  Patient reports that she has been noticing shortness of breath as well.  She does have a history of congestive heart failure.  Reviewing records reveals that she was hospitalized last month for congestive heart failure exacerbation and pneumonia.  Patient's BNP is twice the value that it was during that hospitalization.  Troponins are mildly elevated but flat as well.  This is consistent with congestive heart failure exacerbation.  Additionally, x-ray still shows right upper lobe infiltrate.  During last hospitalization she was treated with Rocephin  and Zithromax  for pneumonia.  This is either persistent pneumonia or asymmetric edema.  During last hospital stay, patient had normal ejection fraction but grade 2 diastolic heart failure.  Additionally, patient has had TAVR secondary to aortic stenosis, echo in December concerning for prosthetic valve stenosis.  At time of discharge from last  hospitalization, patient was instructed to take Lasix   20 mg every other day which she has been doing without missing doses.  Will admit hospital for further management of CHF exacerbation with possible persistent pneumonia on x-ray.  CRITICAL CARE Performed by: Lonni JINNY Seats   Total critical care time: 30 minutes  Critical care time was exclusive of separately billable procedures and treating other patients.  Critical care was necessary to treat or prevent imminent or life-threatening deterioration.  Critical care was time spent personally by me on the following activities: development of treatment plan with patient and/or surrogate as well as nursing, discussions with consultants, evaluation of patient's response to treatment, examination of patient, obtaining history from patient or surrogate, ordering and performing treatments and interventions, ordering and review of laboratory studies, ordering and review of radiographic studies, pulse oximetry and re-evaluation of patient's condition.      Final diagnoses:  Chest pain, unspecified type  Acute on chronic diastolic congestive heart failure [facility])    ED Discharge Orders     None          Seats Lonni JINNY, MD 11/03/24 [phone]  "

## 2024-11-03 NOTE — Telephone Encounter (Signed)
 Oral Oncology Patient Advocate Encounter  Prior Authorization for lenalidomide  has been approved.    PA# [id number] Effective dates: 11/03/24 through 10/22/25   Lucie Lamer, CPhT [place]  [facility] On[facility] Patient Advocate Specialist [facility] [place] Phone: [phone]  Fax: [phone] [email or web address]

## 2024-11-03 NOTE — Assessment & Plan Note (Signed)
 Continue: Metoprolol , losartan , Currently on IV Lasix , monitoring BP closely

## 2024-11-03 NOTE — Assessment & Plan Note (Signed)
 Monitoring troponin -Likely ischemic demand in the setting of CHF exacerbation -Chest pain has resolved -Continue current meds

## 2024-11-03 NOTE — Hospital Course (Addendum)
 [name] was admitted to the hospital with the working diagnosis of heart failure exacerbation.   89 year old female with the past medical history of heart failure,  S/P TAVR (2019), bilateral carotid stenosis, TIAs, A-fib, s/p PPM, GERD, HTN, HLD, CKD IIIb hypothyroidism, multiple myeloma anemia of chronic disease, who presented with dyspnea, cough and intermittent chest pain.  Recent hospitalization 10/05/24 - 10/08/24 for acute on chronic diastolic congestive heart failure exacerbation. She was discharged on Lasix  20 mg 3 times per week On her initial physical examination her Blood pressure 107/78, pulse 65, temperature 98 F (36.7 C), temperature source Oral, resp. rate 17, height 5' 4 (1.626 m), weight 78 kg, SpO2 95%. Lungs with no wheezing, positive rales, heart with S1 and S2 present and regular with no gallops or rubs, positive systolic murmur at the base, abdomen with no distention and positive lower extremity edema.   Na 139 K 4,0 Cl 106 bicarbonate 22, glucose 93 bun 21 cr 1.12 BNP 1,478  High sensitive troponin 30 and 31  Wbc 4.3 hgb 9.2 plt 141   Sars covid 19 negative Influenza negative  RSV negative   Chest radiograph with hypoinflation with positive cardiomegaly, bilateral hilar vascular congestion, pacemaker in place with right atrium and left ventricular lead in place.   EKG 61 bpm, normal axis, normal intervals, qtc 487, sinus sensing and ventricular pacing, with no significant ST segment or T wave changes.   Patient has been on furosemide  for diuresis.  01/13 improved symptoms, plan for right heart catheterization.

## 2024-11-03 NOTE — Assessment & Plan Note (Signed)
 Continue PPI

## 2024-11-03 NOTE — Assessment & Plan Note (Deleted)
 Microcytic anemia in the setting of acute on chronic anemia of chronic disease -Monitoring H&H closely -Stable

## 2024-11-03 NOTE — Consult Note (Addendum)
 "  Cardiology Consultation   Patient ID: Zoe [name] MRN: [id number]; DOB: 1934/10/19  Admit date: 11/02/2024 Date of Consult: 11/03/2024  PCP:  Gerome Brunet, DO   [facility] Providers Cardiologist:  Gordy Bergamo, MD  Electrophysiologist:  Danelle Birmingham, MD       Patient Profile: Zoe [name] is a 89 y.o. 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 Valeta.  History of Present Illness:  [name] follows with Dr. Bergamo, Dr. Birmingham, and APP team for above complex history. She has had multiple admissions over the last several years with a variety of issues including chest pain with elevated troponins, AF RVR, syncope (with CHB requiring PPM), TIA (without clear association to afib), CHF, also with chronic anemia and peripheral neuropathy. She was last admitted 09/2024 with a/c CHF. CTA was suggestive of widespread multifocal PNA though clinically suspected less likely. Amiodarone  was discontinued due to concern for interstitial changes. Covid/flu negative last admission. Last echo during that admission showed EF 55-60%, G2DD, mild LVH, mildly elevated PA pressure, moderate LAE, mild MR, mild MS, Prothetic valve well seated with calcified leaflet and hemodynamics concerning for prosthetic valve stenosis.Dr. Bergamo felt that if she had recurrent episodes of heart failure or significant dyspnea, may need to be considered for redo TAVR. She was last seen in the office by Damien Braver NP 10/27/24 with evidence of volume overload with titration of oral  Lasix . She last saw heme-onc in 08/2024 with plan for 1 month re-eval but follow-up appointments had to be deferred due to hospitalization each time.  She presented back to the ED overnight with chest pain, SOB, and sputum production. She reports that over the past few weeks she has continued to have a dry tickle cough with mild phlegm production. She was somewhat of a challenging historian with regards to the chest pain. She initially stated that this started this morning before breakfast, lasting a few minutes, after which she notified [facility] and they called 911. However, we discussed that records show she came in overnight. She initially did not recall coming in overnight, then later recalled having a second episode that prompted EMS. She ultimately settled that she had 2 episodes yesterday, once in the AM that was brief and resolved spontaneously, went on to have a normal day, then had a send episode in the evening prompting EMS. She received 1 SL NTG with some relief. She also has notice increasing orthopnea the last few days. Edema has been well controlled. Current weight is stable compared to d/c weight 12/17.  Here, CXR shows increased interstitial markings, right upper lobe predominant, suggesting residual atypical infection/pneumonia. Labs show pro-cal <0.10, hsTrop 30-31, pBNP 1478, alb 3.3, Cr 1.12 (decreased from prior), Hgb 9.2 (variable), plt 141 (variable). She received IV dilaudid  as well as started on IV Lasix  and azithromycin . She reports good UOP with IV Lasix  and is currently feeling well without complaint.    Past Medical History:  Diagnosis Date   Arthritis    some - per patient   Atrial fibrillation (HCC)    Breast cancer (HCC)  breast cancer / left    Cataract    bilat    GERD (gastroesophageal reflux disease)    History of kidney stones    Hyperlipidemia    Hypertension    Hypothyroidism    Macular degeneration    Left   S/P TAVR (transcatheter aortic valve  replacement) 09/03/2018   23 mm Edwards Sapien 3 transcatheter heart valve placed via percutaneous right transfemoral approach    Severe aortic stenosis    Stress incontinence    Thyroid  disease    Tinnitus     Past Surgical History:  Procedure Laterality Date   ABDOMINAL HYSTERECTOMY  1970's   BACK SURGERY     BREAST LUMPECTOMY  12/1998   lumpectomy   CARDIAC CATHETERIZATION     CARDIOVERSION N/A 04/18/2023   Procedure: CARDIOVERSION;  Surgeon: Michele Richardson, DO;  [facility];  Service: Cardiovascular;  Laterality: N/A;   CARDIOVERSION N/A 04/20/2023   Procedure: CARDIOVERSION;  Surgeon: Ladona Heinz, MD;  Location: [facility];  Service: Cardiovascular;  Laterality: N/A;   EYE SURGERY     cataract surgery bilat    INTRAOPERATIVE TRANSTHORACIC ECHOCARDIOGRAM N/A 09/03/2018   Procedure: INTRAOPERATIVE TRANSTHORACIC ECHOCARDIOGRAM;  Surgeon: Verlin Lonni BIRCH, MD;  Location: [facility] OR;  Service: Open Heart Surgery;  Laterality: N/A;   KYPHOPLASTY N/A 09/07/2022   Procedure: THORACIC EIGHT KYPHOPLASTY;  Surgeon: Burnetta Aures, MD;  Location: [facility] OR;  Service: Orthopedics;  Laterality: N/A;  1 hr Local with IV Regional 3 C-Bed   LITHOTRIPSY     PACEMAKER IMPLANT N/A 01/09/2024   Procedure: PACEMAKER IMPLANT;  Surgeon: Waddell Danelle ORN, MD;  Location: [facility];  Service: Cardiovascular;  Laterality: N/A;   Right total knee     2018 Dr. Ernie   RIGHT/LEFT HEART CATH AND CORONARY ANGIOGRAPHY N/A 08/06/2018   Procedure: RIGHT/LEFT HEART CATH AND CORONARY ANGIOGRAPHY;  Surgeon: Ladona Heinz, MD;  Location: [facility];  Service: Cardiovascular;  Laterality: N/A;   THYROIDECTOMY, PARTIAL  1975   TONSILLECTOMY     as a child - patient not sure of exact date   TOTAL KNEE ARTHROPLASTY Left 03/13/2016   Procedure: TOTAL KNEE ARTHROPLASTY;  Surgeon: Donnice Ernie, MD;  Location: [facility] OR[facility];  Service: Orthopedics;  Laterality: Left;   TOTAL KNEE ARTHROPLASTY  Right 06/18/2017   Procedure: RIGHT TOTAL KNEE ARTHROPLASTY;  Surgeon: Ernie Donnice, MD;  Location: [facility];  Service: Orthopedics;  Laterality: Right;   TRANSCATHETER AORTIC VALVE REPLACEMENT, TRANSFEMORAL N/A 09/03/2018   Procedure: TRANSCATHETER AORTIC VALVE REPLACEMENT, TRANSFEMORAL;  Surgeon: Verlin Lonni BIRCH, MD;  Location: [facility];  Service: Open Heart Surgery;  Laterality: N/A;     Home Medications:  Prior to Admission medications  Medication Sig Start Date End Date Taking? Authorizing Provider  acetaminophen  (TYLENOL ) 500 MG tablet Take 1,000 mg by mouth every 6 (six) hours as needed for mild pain (pain score 1-3).   Yes [provider]  acyclovir  (ZOVIRAX ) 400 MG tablet Take 400 mg by mouth 2 (two) times daily. 09/24/24  Yes [provider]  amLODipine  (NORVASC ) 5 MG tablet TAKE 1 TABLET BY MOUTH AT BEDTIME 09/23/24  Yes Ladona Heinz, MD  apixaban  (ELIQUIS ) 5 MG TABS tablet Take 1 tablet (5 mg total) by mouth 2 (two) times daily. 11/15/23  Yes Ladona Heinz, MD  Cholecalciferol  25 MCG (1000 UT) TBDP Take 1,000 Units by mouth in the morning.   Yes [provider]  cyanocobalamin  1000 MCG tablet  Take 1,000 mcg by mouth in the morning.   Yes [provider]  empagliflozin  (JARDIANCE ) 10 MG TABS tablet Take 1 tablet (10 mg total) by mouth daily. 10/08/24  Yes Ladona Heinz, MD  furosemide  (LASIX ) 20 MG tablet Three times a week as needed; take potassium if taking lasix  Patient taking differently: Take 20 mg by mouth every other day. Three times a week as needed; take potassium if taking lasix  04/01/24  Yes Patsy Lenis, MD  gabapentin  (NEURONTIN ) 100 MG capsule Take 1 capsule (100 mg total) by mouth 3 (three) times daily. 10/08/24  Yes Pokhrel, Laxman, MD  lenalidomide  (REVLIMID ) 5 MG capsule Take 1 capsule (5 mg total) by mouth daily. Auth #  [id number]  Date obtained: 10/29/2024 Take 1 capsule daily for 21 days and then none for 7 days. 10/29/24  Yes Federico Norleen ONEIDA MADISON, MD  levothyroxine  (SYNTHROID , LEVOTHROID) 100 MCG tablet Take 100 mcg by mouth daily before breakfast. 12/29/15  Yes [provider]  losartan  (COZAAR ) 50 MG tablet Take 1 tablet (50 mg total) by mouth daily. Take at night Patient taking differently: Take 50 mg by mouth at bedtime. 08/27/24  Yes Meng, Hao, PA  metoprolol  succinate (TOPROL -XL) 50 MG 24 hr tablet Take 50 mg by mouth daily. Take with or immediately following a meal. Take in the Morning   Yes [provider]  Multiple Vitamins-Minerals (PRESERVISION AREDS) CAPS Take 1 capsule by mouth 2 (two) times daily.   Yes [provider]  oxycodone  (OXY-IR) 5 MG capsule Take 5 mg by mouth every 12 (twelve) hours as needed for pain.   Yes [provider]  pantoprazole  (PROTONIX ) 40 MG tablet Take 1 tablet (40 mg total) by mouth daily. 04/01/24  Yes Patsy Lenis, MD  polyethylene glycol (MIRALAX  / GLYCOLAX ) 17 g packet Take 17 g by mouth daily.   Yes [provider]  potassium chloride  SA (KLOR-CON  M) 20 MEQ tablet Three times a week as needed only if taking lasix  Patient taking differently: Take 20 mEq by mouth every other day. Three times a week as needed only if taking lasix  04/01/24  Yes Patsy Lenis, MD  rosuvastatin  (CRESTOR ) 20 MG tablet Take 1 tablet (20 mg total) by mouth daily. 04/02/24  Yes Patsy Lenis, MD    Scheduled Meds:  acyclovir   400 mg Oral BID   [START ON 11/04/2024] amLODipine   2.5 mg Oral QHS   apixaban   5 mg Oral BID   azithromycin   500 mg Oral Daily   chlorpheniramine-HYDROcodone   5 mL Oral Q12H   cholecalciferol   1,000 Units Oral q AM   cyanocobalamin   1,000 mcg Oral q AM   empagliflozin   10 mg Oral Daily   furosemide   40 mg Intravenous BID   gabapentin   100 mg Oral TID   guaiFENesin   10 mL Oral Q8H   levothyroxine   100 mcg Oral Q0600   [START ON 11/04/2024] losartan   50 mg Oral QPM   metoprolol  succinate  50 mg Oral Daily   pantoprazole   40 mg Oral Daily    polyethylene glycol  17 g Oral Daily   [START ON 11/04/2024] potassium chloride  SA  20 mEq Oral QODAY   rosuvastatin   20 mg Oral Daily   sodium chloride  flush  3 mL Intravenous Q12H   sodium chloride  flush  3 mL Intravenous Q12H   Continuous Infusions:  PRN Meds: acetaminophen  **OR** acetaminophen , benzonatate , hydrALAZINE , HYDROmorphone  (DILAUDID ) injection, ipratropium, melatonin, ondansetron  **OR** ondansetron  (ZOFRAN ) IV, oxyCODONE , senna-docusate,  sodium phosphate , traZODone   Allergies:   Allergies[1]  Social History:   Social History   Socioeconomic History   Marital status: Widowed    Spouse name: Not on file   Number of children: 4   Years of education: Not on file   Highest education level: Master's degree (e.g., MA, MS, MEng, MEd, MSW, MBA)  Occupational History   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  Tobacco Use   Smoking status: Never   Smokeless tobacco: Never  Vaping Use   Vaping status: Never Used  Substance and Sexual Activity   Alcohol  use: No   Drug use: No   Sexual activity: Not Currently  Other Topics Concern   Not on file  Social History Narrative   Not on file   Social Drivers of Health   Tobacco Use: Low Risk (11/03/2024)   Patient History    Smoking Tobacco Use: Never    Smokeless Tobacco Use: Never    Passive Exposure: Not on file  Financial Resource Strain: Low Risk (10/08/2024)   Overall Financial Resource Strain (CARDIA)    Difficulty of Paying Living Expenses: Not very hard  Food Insecurity: No Food Insecurity (10/06/2024)   Epic    Worried About Radiation Protection Practitioner of Food in the Last Year: Never true    Ran Out of Food in the Last Year: Never true  Transportation Needs: No Transportation Needs (10/06/2024)   Epic    Lack of Transportation (Medical): No    Lack of Transportation (Non-Medical): No  Physical Activity: Not on file  Stress: Not on file  Social Connections: Moderately Integrated (10/06/2024)    Social Connection and Isolation Panel    Frequency of Communication with Friends and Family: More than three times a week    Frequency of Social Gatherings with Friends and Family: Once a week    Attends Religious Services: More than 4 times per year    Active Member of [employer] or Organizations: Yes    Attends Banker Meetings: More than 4 times per year    Marital Status: Widowed  Intimate Partner Violence: Not At Risk (10/06/2024)   Epic    Fear of Current or Ex-Partner: No    Emotionally Abused: No    Physically Abused: No    Sexually Abused: No  Depression (PHQ2-9): Low Risk (08/11/2024)   Depression (PHQ2-9)    PHQ-2 Score: 0  Alcohol  Screen: Low Risk (10/08/2024)   Alcohol  Screen    Last Alcohol  Screening Score (AUDIT): 0  Housing: Low Risk (10/06/2024)   Epic    Unable to Pay for Housing in the Last Year: No    Number of Times Moved in the Last Year: 0    Homeless in the Last Year: No  Utilities: Not At Risk (10/06/2024)   Epic    Threatened with loss of utilities: No  Health Literacy: Not on file    Family History:   Family History  Problem Relation Age of Onset   Diabetes Mother    Stroke Mother        Carotid artery disease   Heart disease Father        CAD   Coronary artery disease Father    Diabetes Sister      ROS:  Please see the history of present illness.  All other ROS reviewed and negative.     Physical Exam/Data: Vitals:   11/03/24 0600 11/03/24 0630 11/03/24 0931 11/03/24 0947  BP:  107/78 (!) 135/56  Pulse:  65 87   Resp:  17 17   Temp: 98 F (36.7 C)   97.6 F (36.4 C)  TempSrc: Oral     SpO2:  95% 95%   Weight:      Height:        Intake/Output Summary (Last 24 hours) at 11/03/2024 1110 Last data filed at 11/03/2024 0600 Gross per 24 hour  Intake 0 ml  Output 1200 ml  Net -1200 ml      11/03/2024    3:59 AM 10/27/2024    3:21 PM 10/08/2024    4:31 AM  Last 3 Weights  Weight (lbs) 171 lb 15.3 oz 172 lb 171 lb 4.8 oz   Weight (kg) 78 kg 78.019 kg 77.701 kg     Body mass index is 29.52 kg/m.  General: Well developed, well nourished, in no acute distress. Head: Normocephalic, atraumatic, sclera non-icteric, no xanthomas, nares are without discharge. Neck: Negative for carotid bruits. JVP not elevated. Lungs: Clear bilaterally to auscultation without wheezes, rales, or rhonchi. Breathing is unlabored. Heart: RRR S1 S2 with soft SEM RUSB, no rubs or gallops.  Abdomen: Soft, non-tender, non-distended with normoactive bowel sounds. No rebound/guarding. Extremities: No clubbing or cyanosis. No edema. Distal pedal pulses are 2+ and equal bilaterally. Neuro: Alert and oriented X 3. Moves all extremities spontaneously. Psych:  Responds to questions appropriately with a normal affect.   EKG:  The EKG was personally reviewed and demonstrates:  EKGs appear to show AV paced rhythm as well as atrial sensed V paced rhythm LBBB morphology, no acute STT changes, similar to prior. Telemetry:  Telemetry was personally reviewed and demonstrates:  NSR  Relevant CV Studies: 2d echo 10/06/24   1. Left ventricular ejection fraction, by estimation, is 55 to 60%. The  left ventricle has normal function. The left ventricle has no regional  wall motion abnormalities. There is mild concentric left ventricular  hypertrophy. Left ventricular diastolic  parameters are consistent with Grade II diastolic dysfunction  (pseudonormalization). Elevated left atrial pressure.   2. Right ventricular systolic function is normal. The right ventricular  size is normal. There is mildly elevated pulmonary artery systolic  pressure. The estimated right ventricular systolic pressure is 37.3 mmHg.   3. Left atrial size was moderately dilated.   4. The mitral valve is degenerative. Mild mitral valve regurgitation.  Mild mitral stenosis. The mean mitral valve gradient is 4.0 mmHg with  average heart rate of 65 bpm. Moderate to severe mitral  annular  calcification.   5. Prothetic valve well seated with calcified leaflet and hemodynamics  concerning for prosthetic valve stenosis (PV 3.45 m/sec, MG 28 mmHg, and  DVI 0.31). Trivial transvalvular regurgitation. The aortic valve has been  repaired/replaced. Aortic valve  regurgitation is trivial. There is a 23 mm Sapien prosthetic (TAVR) valve  present in the aortic position. Procedure Date: 09/03/2018.   Comparison(s): Prior images reviewed side by side. Gradient across  prosthetic valve is higher.   Laboratory Data: High Sensitivity Troponin:   Recent Labs  Lab 10/05/24 0300  TROPONINIHS 23*    Recent Labs  Lab 11/03/24 0030 11/03/24 0220  TRNPT 30* 31*      Chemistry Recent Labs  Lab 10/27/24 1650 11/03/24 0030 11/03/24 0220  NA 139 139  --   K 4.0 4.0  --   CL 105 106  --   CO2 19* 22  --   GLUCOSE 97 93  --   BUN 18 21  --  CREATININE 1.37* 1.12*  --   CALCIUM  8.5* 8.3*  --   MG  --   --  1.6*  GFRNONAA  --  47*  --   ANIONGAP  --  10  --     Recent Labs  Lab 11/03/24 0030  PROT 6.1*  ALBUMIN 3.3*  AST 15  ALT 6  ALKPHOS 88  BILITOT 0.7   Lipids No results for input(s): CHOL, TRIG, HDL, LABVLDL, LDLCALC, CHOLHDL in the last 168 hours.  Hematology Recent Labs  Lab 10/27/24 1650 11/03/24 0030  WBC 5.6 4.3  RBC 3.79 3.34*  HGB 10.3* 9.2*  HCT 33.3* 30.0*  MCV 88 89.8  MCH 27.2 27.5  MCHC 30.9* 30.7  RDW 16.4* 17.8*  PLT 162 141*   Thyroid  No results for input(s): TSH, FREET4 in the last 168 hours.  BNP Recent Labs  Lab 11/03/24 0030  PROBNP 1,478.0*    DDimer No results for input(s): DDIMER in the last 168 hours.  Radiology/Studies:  DG Chest Port 1 View Result Date: 11/03/2024 EXAM: 1 VIEW(S) XRAY OF THE CHEST 11/03/2024 12:24:13 AM COMPARISON: 10/08/2024 CLINICAL HISTORY: chest pain FINDINGS: LINES, TUBES AND DEVICES: Left subclavian pacemaker. LUNGS AND PLEURA: Increased interstitial markings, right upper  lobe predominant, suggesting residual atypical infection/pneumonia. No pleural effusion. No pneumothorax. HEART AND MEDIASTINUM: Cardiomegaly. Thoracic atherosclerosis. BONES AND SOFT TISSUES: No acute osseous abnormality. IMPRESSION: 1. Increased interstitial markings, right upper lobe predominant, suggesting residual atypical infection/pneumonia. Electronically signed by: Pinkie Pebbles MD MD 11/03/2024 12:28 AM EST RP Workstation: [id number]     Assessment and Plan:  1. Chest pain with low/flat troponins - chest pain episodes were brief and short lived, with low/flat troponins  - cath negative for CAD in 2019 - will review with MD  2. Shortness of breath/orthopnea with mild acute on chronic HFpEF, also with history of severe AS s/p TAVR with possible prosthetic stenosis by echo 09/2024 - pBNP elevated at 1478 (was still up at dc 12/17) in setting of mild CKD - good response to diuresis, volume status looks OK on exam presently - will review with Dr. Francyne regarding next steps - given hx of MM, peripheral neuropathy, AS, CHF, consider OP amyloid workup  3. Abnormal CXR ?persistent infiltrates - primary team treating empirically with azithromycin  - Covid/flu/RSVP neg last admission - repeat resp panel pending - query utility of high res CT   4. Paroxysmal atrial fibrillation as well as hx of CHB s/p PPM -  - remains on Eliquis  5mg  BID, dose appropriate - amiodarone  previously d/c due to interstitial changes, ?role of high res CT  - maintaining NSR on Toprol  50mg  daily  5. Hypomagesemia - primary team managing   Risk Assessment/Risk Scores:       New York  Heart Association (NYHA) Functional Class NYHA Class II-III   CHA2DS2-VASc Score = 8   This indicates a 10.8% annual risk of stroke. The patient's score is based upon: CHF History: 1 HTN History: 1 Diabetes History: 0 Stroke History: 2 Vascular Disease History: 1 Age Score: 2 Gender Score: 1       For  questions or updates, please contact [facility] Please consult [email or web address] for contact info under      Signed, Dayna N Dunn, PA-C  11/03/2024 11:10 AM  I have seen and examined the patient along with Dayna N Dunn, PA-C .  I have reviewed the chart, notes and new data.  I agree with PA/NP's note.  Key new complaints:  Her major complaint is that of discomfort underneath her left breast, present at rest.  This is what prompted her to ask the staff at [facility] to call for medical assistance.  It improved immediately following sublingual nitroglycerin  and improved further after administration of diuretics.  She has mild recurrent discomfort as we speak.  In addition she describes symptoms are strongly suggestive of paroxysmal nocturnal dyspnea: In the middle of the night she will wake up feeling that the fan from the air conditioner is not doing a good enough job and will have to sit up in her recliner and read for a while until her breathing improves.  This has been happening nightly for the last 4 nights. Key examination changes: Regular rate and rhythm, distinct second heart sound, the aortic ejection murmur is fairly mild.  Jugular venous pulsations appear to be normal.  Has minimal edema in the ankles bilaterally. Key new findings / data: ECG shows atrial sensed, ventricular paced rhythm with a rather narrow QRS complex suggesting left bundle pacing.  Echocardiogram shows normal LVEF 55 to 60%, mild LVH, grade 2 diastolic dysfunction with pseudo normal mitral inflow pattern and elevated mean left atrial pressure, mildly elevated PA AP 37 mmHg, moderately dilated left atrium.  She has moderate to severe mitral calcification and very mild mitral stenosis with a mean gradient of 4 mmHg.  The prosthetic aortic valve (23 mm SAPIEN Edwards, implanted 2019) has elevated mean gradient at 28 mmHg and a dimensionless valve index of 0.31.  The aortic ejection jet remains early peaking.  The  inferior vena cava was not dilated.  Did not have any coronary artery disease at the time of pre-TAVR catheterization in 2019  PLAN: It's possible that her symptoms are due to recurrent acute on chronic diastolic heart failure and we have never achieved sufficient diuresis.  Possible etiology is deterioration of TAVR (although still appears to be only moderate stenosis), ventricular pacing (although she has a narrow QRS complex with left bundle pacing), some contribution of mild mitral stenosis.  The burden of atrial arrhythmia is very low and cannot be incriminated as a cause for her heart failure exacerbation.  Consider AL amyloidosis with her diagnosis of myeloma.  Continue IV diuretics today and tomorrow and plan optimization with a right heart catheterization on Wednesday.  Hold her anticoagulation until then.  I think it is unlikely that she has developed significant coronary disease since her catheterization in 2019.  Jerel Balding, MD, [facility] [phone] 11/03/2024, 1:18 PM      [1]  Allergies Allergen Reactions   Quinolones Other (See Comments)    Patient on Amiodarone  and can prolong QT   Penicillins Other (See Comments)    UNSPECIFIED REACTION  Patient does not remember reaction.  Has patient had a PCN reaction causing immediate rash, facial/tongue/throat swelling, SOB or lightheadedness with hypotension: no Has patient had a PCN reaction causing severe rash involving mucus membranes or skin necrosis: no Has patient had a PCN reaction that required hospitalization no Has patient had a PCN reaction occurring within the last 10 years: no If all of the above answers are NO, then may proceed with Cephalosporin use.    Sulfa Antibiotics Other (See Comments)    UNSPECIFIED REACTION  maybe vision issues?    "

## 2024-11-03 NOTE — Assessment & Plan Note (Deleted)
 Continue metoprolol , Eliquis  Rongeuring electrolytes, maintaining magnesium  > 2.0 , potassium >4.0

## 2024-11-03 NOTE — Telephone Encounter (Signed)
 Oral Oncology Patient Advocate Encounter   Received notification that prior authorization for lenalidomide  (REVLIMID ) 5 MG capsule  is required.   PA submitted on 11/03/24 Key [id number] Status is pending     Lucie Lamer, CPhT [place]  [facility] Oncology Pharmacy Patient Advocate Specialist [address] [place] Phone: [phone]  Fax: [phone] [email or web address]

## 2024-11-03 NOTE — ED Notes (Signed)
 Son, Ozell, was called at the patient's request.

## 2024-11-03 NOTE — Assessment & Plan Note (Signed)
 Noted in remission

## 2024-11-03 NOTE — Assessment & Plan Note (Signed)
 Continue Synthroid 

## 2024-11-03 NOTE — Progress Notes (Signed)
 Transition of Care [facility]) - Inpatient Brief Assessment  Patient Details  Name: Zoe [name] MRN: [id number] Date of Birth: October 30, 1933  Transition of Care [facility]) CM/SW Contact:    Nena LITTIE Coffee, RN Phone Number: 11/03/2024, 6:28 PM  Clinical Narrative: Pt is a resident at [employer], ground floor apt. She is independent c/ADLS c/standby assist for showers as fall precaution. Daughter-in-law and friends provide transportation when needed.   Observation status and MOON notification explained to pt and she refused to sign notice as she disagrees c/OBS status. Instructed pt to discuss c/provider. Printed MOON left at bedside.   Current DME: cane, walker, raised toilet seat, grab bars in bathroom, walk-in shower c/seat.   Transition of Care Asessment: Insurance and Status: (P) Insurance coverage has been reviewed Patient has primary care physician: (P) Yes Home environment has been reviewed: (P) From home alone Prior level of function:: (P) min assist Prior/Current Home Services: (P) No current home services Social Drivers of Health Review: (P) SDOH reviewed needs interventions Readmission risk has been reviewed: (P) Yes Transition of care needs: (P) transition of care needs identified, TOC will continue to follow

## 2024-11-03 NOTE — Progress Notes (Signed)
" °  Carryover admission to the Day Admitter.  I discussed this case with the EDP, Dr. Haze.  Per these discussions:   This is a 89 year old female with history of chronic diastolic heart failure, aortic stenosis status post TAVR, who is being admitted with suspected acute on chronic diastolic heart failure exacerbation after presenting with recent worsening of shortness of breath associated with cough with some sputum production, as well as some intermittent chest discomfort.   He was recently hospitalized in the [place] system from 10/05/2024 to 10/08/2024 for acute on chronic diastolic heart failure exacerbation.  She underwent updated echocardiogram on 10/06/2024, which was reported to show grade 2 diastolic dysfunction.  There is also reported to be concerned during his most recent prior hospitalization that, in the context of a history of aortic stenosis status post TAVR, that she did develop some restenosis of the aortic valve.  Additionally, during this previous hospitalization, there was reported to be chest x-ray evidence of increase in interstitial markings, noted upper lobe predominant, was treated with azithromycin , Rocephin .  She was subsequently discharged on 07/09/2024 on Lasix  20 mg 3 times per week.  Vital signs upon presentation this evening  Were notable for the following: Afebrile; heart rates in the 60s; systolic blood pressures in the 130s; initial oxygen saturations in the high 90s on room air.  proBNP 1478 compared to proBNP during most recent prior hospitalization, which is noted to be 587.  High-sensitivity troponin T was initially 30, with repeat value noted to be 31.  This is relative to high-sensitivity troponin and I have 23 on 10/05/2024.  No report of any active chest discomfort in the ED this evening.  EKG without reported acute ischemic changes.  This evening's chest x-ray, in comparison to chest x-ray from 10/08/2024, showed interval increase in interstitial  markings, right upper lobe predominant, which was read by radiology is concerning for atypical infection/pneumonia.  Unclear if this is representative of the symmetrical edema.  She received Lasix  40 mg IV x 1 dose in the ED.  I have placed an order for observation for further evaluation management of the above.   I have placed some additional preliminary admit orders via the adult multi-morbid admission order set. I have also ordered additional Lasix  in the form of Lasix  20 mg IV twice daily, with next dose to occur around 10 AM this morning.  Also ordered further trending of troponin and added on magnesium  and phosphorus levels.  Regarding chest x-ray showing interval increase in the interstitial markings, right upper lobe predominant, I will defer to the admitting hospitalist decision making regarding whether or not to initiate additional antibiotics.  I have added on procalcitonin level and ordered scheduled by Fenesin, as needed Tessalon  Perles, incentive spirometry and flutter valve.    Zoe Pore, DO Hospitalist  "

## 2024-11-04 ENCOUNTER — Telehealth: Payer: Self-pay | Admitting: Cardiology

## 2024-11-04 ENCOUNTER — Inpatient Hospital Stay: Admitting: Physician Assistant

## 2024-11-04 ENCOUNTER — Inpatient Hospital Stay: Attending: Physician Assistant

## 2024-11-04 DIAGNOSIS — N1832 Chronic kidney disease, stage 3b: Secondary | ICD-10-CM | POA: Diagnosis present

## 2024-11-04 DIAGNOSIS — Z7984 Long term (current) use of oral hypoglycemic drugs: Secondary | ICD-10-CM | POA: Diagnosis not present

## 2024-11-04 DIAGNOSIS — Z8673 Personal history of transient ischemic attack (TIA), and cerebral infarction without residual deficits: Secondary | ICD-10-CM

## 2024-11-04 DIAGNOSIS — I5033 Acute on chronic diastolic (congestive) heart failure: Secondary | ICD-10-CM | POA: Diagnosis present

## 2024-11-04 DIAGNOSIS — E039 Hypothyroidism, unspecified: Secondary | ICD-10-CM | POA: Diagnosis present

## 2024-11-04 DIAGNOSIS — I10 Essential (primary) hypertension: Secondary | ICD-10-CM | POA: Diagnosis not present

## 2024-11-04 DIAGNOSIS — I951 Orthostatic hypotension: Secondary | ICD-10-CM | POA: Diagnosis present

## 2024-11-04 DIAGNOSIS — M25531 Pain in right wrist: Secondary | ICD-10-CM | POA: Diagnosis present

## 2024-11-04 DIAGNOSIS — I130 Hypertensive heart and chronic kidney disease with heart failure and stage 1 through stage 4 chronic kidney disease, or unspecified chronic kidney disease: Secondary | ICD-10-CM | POA: Diagnosis present

## 2024-11-04 DIAGNOSIS — Z7901 Long term (current) use of anticoagulants: Secondary | ICD-10-CM | POA: Diagnosis not present

## 2024-11-04 DIAGNOSIS — I480 Paroxysmal atrial fibrillation: Secondary | ICD-10-CM | POA: Diagnosis present

## 2024-11-04 DIAGNOSIS — T82857A Stenosis of cardiac prosthetic devices, implants and grafts, initial encounter: Secondary | ICD-10-CM | POA: Diagnosis present

## 2024-11-04 DIAGNOSIS — J9601 Acute respiratory failure with hypoxia: Secondary | ICD-10-CM | POA: Diagnosis present

## 2024-11-04 DIAGNOSIS — Z1152 Encounter for screening for COVID-19: Secondary | ICD-10-CM | POA: Diagnosis not present

## 2024-11-04 DIAGNOSIS — C9001 Multiple myeloma in remission: Secondary | ICD-10-CM

## 2024-11-04 DIAGNOSIS — I2721 Secondary pulmonary arterial hypertension: Secondary | ICD-10-CM | POA: Diagnosis present

## 2024-11-04 DIAGNOSIS — Z953 Presence of xenogenic heart valve: Secondary | ICD-10-CM | POA: Diagnosis not present

## 2024-11-04 DIAGNOSIS — E785 Hyperlipidemia, unspecified: Secondary | ICD-10-CM | POA: Diagnosis present

## 2024-11-04 DIAGNOSIS — D696 Thrombocytopenia, unspecified: Secondary | ICD-10-CM | POA: Diagnosis present

## 2024-11-04 DIAGNOSIS — Y831 Surgical operation with implant of artificial internal device as the cause of abnormal reaction of the patient, or of later complication, without mention of misadventure at the time of the procedure: Secondary | ICD-10-CM | POA: Diagnosis present

## 2024-11-04 DIAGNOSIS — I509 Heart failure, unspecified: Secondary | ICD-10-CM | POA: Diagnosis not present

## 2024-11-04 DIAGNOSIS — K219 Gastro-esophageal reflux disease without esophagitis: Secondary | ICD-10-CM | POA: Diagnosis present

## 2024-11-04 DIAGNOSIS — D509 Iron deficiency anemia, unspecified: Secondary | ICD-10-CM | POA: Diagnosis present

## 2024-11-04 DIAGNOSIS — D638 Anemia in other chronic diseases classified elsewhere: Secondary | ICD-10-CM | POA: Diagnosis present

## 2024-11-04 DIAGNOSIS — C9000 Multiple myeloma not having achieved remission: Secondary | ICD-10-CM | POA: Diagnosis present

## 2024-11-04 DIAGNOSIS — G620 Drug-induced polyneuropathy: Secondary | ICD-10-CM | POA: Diagnosis present

## 2024-11-04 DIAGNOSIS — M109 Gout, unspecified: Secondary | ICD-10-CM | POA: Diagnosis present

## 2024-11-04 DIAGNOSIS — N179 Acute kidney failure, unspecified: Secondary | ICD-10-CM | POA: Diagnosis not present

## 2024-11-04 DIAGNOSIS — F32A Depression, unspecified: Secondary | ICD-10-CM | POA: Diagnosis present

## 2024-11-04 LAB — BASIC METABOLIC PANEL WITH GFR
Anion gap: 13 (ref 5–15)
BUN: 26 mg/dL — ABNORMAL HIGH (ref 8–23)
CO2: 25 mmol/L (ref 22–32)
Calcium: 8.4 mg/dL — ABNORMAL LOW (ref 8.9–10.3)
Chloride: 100 mmol/L (ref 98–111)
Creatinine, Ser: 1.54 mg/dL — ABNORMAL HIGH (ref 0.44–1.00)
GFR, Estimated: 32 mL/min — ABNORMAL LOW
Glucose, Bld: 99 mg/dL (ref 70–99)
Potassium: 3.7 mmol/L (ref 3.5–5.1)
Sodium: 138 mmol/L (ref 135–145)

## 2024-11-04 LAB — CBC
HCT: 33.1 % — ABNORMAL LOW (ref 36.0–46.0)
Hemoglobin: 10.4 g/dL — ABNORMAL LOW (ref 12.0–15.0)
MCH: 27.1 pg (ref 26.0–34.0)
MCHC: 31.4 g/dL (ref 30.0–36.0)
MCV: 86.2 fL (ref 80.0–100.0)
Platelets: 141 K/uL — ABNORMAL LOW (ref 150–400)
RBC: 3.84 MIL/uL — ABNORMAL LOW (ref 3.87–5.11)
RDW: 17.9 % — ABNORMAL HIGH (ref 11.5–15.5)
WBC: 5.3 K/uL (ref 4.0–10.5)
nRBC: 0 % (ref 0.0–0.2)

## 2024-11-04 LAB — TSH: TSH: 1.38 u[IU]/mL (ref 0.350–4.500)

## 2024-11-04 LAB — MAGNESIUM: Magnesium: 2.2 mg/dL (ref 1.7–2.4)

## 2024-11-04 LAB — GLUCOSE, CAPILLARY: Glucose-Capillary: 120 mg/dL — ABNORMAL HIGH (ref 70–99)

## 2024-11-04 NOTE — Telephone Encounter (Signed)
 Patient's daughter (DPR) called back about message. She wanted to know why patient was having a heart cath. Informed her that patient is scheduled for a right heart cath and per the cardiology consult the doctor is wanting to look at the TAVR valve to make sure it is not causing patient's issues. Patient's daughter verbalized understanding. Will send to Dr. Ladona for further advisement is this is not correct.

## 2024-11-04 NOTE — Progress Notes (Signed)
 " Progress Note   Patient: Zoe [name] FMW:[id number] DOB: 1934-07-03 DOA: 11/02/2024     0 DOS: the patient was seen and examined on 11/04/2024   Brief hospital course: Zoe [name] was admitted to the hospital with the working diagnosis of heart failure exacerbation.   89 year old female with the past medical history of heart failure,  S/P TAVR (2019), bilateral carotid stenosis, TIAs, A-fib, s/p PPM, GERD, HTN, HLD, CKD IIIb hypothyroidism, multiple myeloma anemia of chronic disease, who presented with dyspnea, cough and intermittent chest pain.  Recent hospitalization 10/05/24 - 10/08/24 for acute on chronic diastolic congestive heart failure exacerbation. She was discharged on Lasix  20 mg 3 times per week On her initial physical examination her Blood pressure 107/78, pulse 65, temperature 98 F (36.7 C), temperature source Oral, resp. rate 17, height 5' 4 (1.626 m), weight 78 kg, SpO2 95%. Lungs with no wheezing, positive rales, heart with S1 and S2 present and regular with no gallops or rubs, positive systolic murmur at the base, abdomen with no distention and positive lower extremity edema.   Na 139 K 4,0 Cl 106 bicarbonate 22, glucose 93 bun 21 cr 1.12 BNP 1,478  High sensitive troponin 30 and 31  Wbc 4.3 hgb 9.2 plt 141   Sars covid 19 negative Influenza negative  RSV negative   Chest radiograph with hypoinflation with positive cardiomegaly, bilateral hilar vascular congestion, pacemaker in place with right atrium and left ventricular lead in place.   EKG 61 bpm, normal axis, normal intervals, qtc 487, sinus sensing and ventricular pacing, with no significant ST segment or T wave changes.   Patient has been on furosemide  for diuresis.  01/13 improved symptoms, plan for right heart catheterization.   Assessment and Plan: * Acute on chronic diastolic heart failure (HCC) 09/2024 echocardiogram with preserved LV systolic function with EF 55 to 60%, mild LVH, grade II  diastolic dysfunction with pseudo normalization EA, RV systolic function preserved, RVSP 37.3 mmHg, LA with moderate dilatation, mild mitral regurgitation and mild mitral stenosis, sp TAVR, concerned for prosthetic valve stenosis   Continue with mild hypervolemia Urine output not documented.   Continue diuresis with furosemide  40 mg IV bid Medical therapy with losartan , metoprolol  and SGLT 2 inh.  Positive orthostatic hypotension, discontinue amlodipine  Plan for right heart catheterization     Acute hypoxemic respiratory failure due to cardiogenic pulmonary edema, no clinical sings of pneumonia (ruled out pneumonia)  Stop antibiotics Continue diuresis and oxymetry monitoring  02 saturation 88% on room air today    Hypertension Continue losartan  and metoprolol  Positive orthostatic hypotension will discontinue amlodipine   Continue blood pressure monitoring   Paroxysmal atrial fibrillation (HCC) Rate control with metoprolol  and anticoagulation with apixaban    Chronic kidney disease, stage 3b (HCC) Renal function with serum cr at 1.54 with K at 3,7 and serum bicarbonate at 26  Na 138 and Mg 2,2    Continue diuresis and follow up renal function and electrolytes.   Hyperlipidemia Continue rosuvastatin   Hypothyroidism Continue Synthroid   Multiple myeloma (HCC) Monitor closely, stable Continue empiric acyclovir , Lenalidomide    GERD (gastroesophageal reflux disease) Continue PPI  History of stroke Continue secondary prevention, statins, Eliquis ,      Subjective: Patient is feeing better but not yet back to baseline, no chest pain or palpitations   Physical Exam: Vitals:   11/04/24 0500 11/04/24 0743 11/04/24 1144 11/04/24 1633  BP:  104/61 (!) 102/53 127/60  Pulse:   60 60  Resp:  19 18 18  Temp:  98.1 F (36.7 C) 98.1 F (36.7 C) (!) 97.5 F (36.4 C)  TempSrc:  Oral Oral Oral  SpO2:    94%  Weight: 75.1 kg     Height:       Neurology awake and alert ENT  with mid pallor Cardiovascular with S1 and S2 present and regular with no gallops Mild JVD Respiratory with mild rales with no wheezing, no rhonchi Abdomen with no distention, soft and non tender Lower extremity edema +  Data Reviewed:    Family Communication: no family at the bedside   Disposition: Status is: Inpatient Remains inpatient appropriate because: IV diuresis   Planned Discharge Destination: Home     Author: Elidia Toribio Furnace, MD 11/04/2024 4:41 PM  For on call review [email or web address].  "

## 2024-11-04 NOTE — Assessment & Plan Note (Signed)
 Renal function with serum cr at 1.54 with K at 3,7 and serum bicarbonate at 26  Na 138 and Mg 2,2    Continue diuresis and follow up renal function and electrolytes.

## 2024-11-04 NOTE — Evaluation (Signed)
 Occupational Therapy Evaluation Patient Details Name: Zoe [name] MRN: [id number] DOB: Jul 25, 1934 Today's Date: 11/04/2024   History of Present Illness   Pt is a  89 y.o. female who presented 11/02/24 due to chest pain, SOB and cough. CXR shows increased interstitial markings, right upper lobe predominant, suggesting residual atypical infection/pneumonia. Of note, pt had a recent admission 12/14-17-25 due to acute on chronic diastolic CHF. PMH HFpEF, CHF, A-fib , hypothyroidism, HTN, breast CA,multiple myeloma,aortic stenosis status post TAVR, TIA, bil TKA  BP: Sitting at EOB:104/61 (74) Sitting at EOB post 1st attempt to stand: 106/62 (69) Standing (second attempt): 90/49 (63) Supine: 102/49 (65)   Clinical Impressions Pt reported at PLOF they live in ILF ambulating with 4WW with no physical assist but did have caregivers for stand by assist with showers. At this time pt limited in session due to BP as seen bellow. She initially presented at [facility] and wanted to go to the bathroom with therapist. She stood the initial time and felt dizzy and then re attempted while taking bp and again reporting feeling dizzy and her body said to just lay down. At this time recommendation to resume in house therapy once dc as long as BP issues resolve. Acute Occupational Therapy to follow.      If plan is discharge home, recommend the following:   A little help with walking and/or transfers;A little help with bathing/dressing/bathroom;Two people to help with bathing/dressing/bathroom;Assistance with cooking/housework;Direct supervision/assist for medications management;Direct supervision/assist for financial management;Assist for transportation;Help with stairs or ramp for entrance     Functional Status Assessment   Patient has had a recent decline in their functional status and demonstrates the ability to make significant improvements in function in a reasonable and predictable amount of time.      Equipment Recommendations    (TBD on progression)     Recommendations for Other Services         Precautions/Restrictions   Precautions Precautions: Fall Recall of Precautions/Restrictions: Intact Precaution/Restrictions Comments: watch vitals Restrictions Weight Bearing Restrictions Per Provider Order: No     Mobility Bed Mobility Overal bed mobility: Modified Independent             General bed mobility comments: HOB elevated but no physical assist    Transfers Overall transfer level: Needs assistance Equipment used: Rollator (4 wheels) Transfers: Sit to/from Stand Sit to Stand: Contact guard assist           General transfer comment: light CGA but as became lightheaded provided close CGA      Balance Overall balance assessment: Needs assistance Sitting-balance support: Feet supported Sitting balance-Leahy Scale: Good     Standing balance support: Bilateral upper extremity supported, No upper extremity supported Standing balance-Leahy Scale: Fair Standing balance comment: only took about 4 side steps due to bp                           ADL either performed or assessed with clinical judgement   ADL Overall ADL's : Needs assistance/impaired Eating/Feeding: Independent;Sitting   Grooming: Wash/dry hands;Wash/dry face;Set up   Upper Body Bathing: Set up;Sitting   Lower Body Bathing: Contact guard assist;Minimal assistance;Sit to/from stand   Upper Body Dressing : Set up;Sitting   Lower Body Dressing: Contact guard assist;Sit to/from stand;Sitting/lateral leans;Minimal assistance   Toilet Transfer: Contact guard assist   Toileting- Clothing Manipulation and Hygiene: Contact guard assist;Sit to/from stand       Functional mobility during  ADLs: Contact guard assist;Rolling walker (2 wheels);Cueing for sequencing;Cueing for safety       Vision Baseline Vision/History: 1 Wears glasses Ability to See in Adequate Light: 0  Adequate Patient Visual Report: No change from baseline Vision Assessment?: No apparent visual deficits     Perception Perception: Within Functional Limits       Praxis Praxis: WFL       Pertinent Vitals/Pain Pain Assessment Pain Assessment: No/denies pain     Extremity/Trunk Assessment Upper Extremity Assessment Upper Extremity Assessment: Generalized weakness (decrease in B grip)   Lower Extremity Assessment Lower Extremity Assessment: Defer to PT evaluation   Cervical / Trunk Assessment Cervical / Trunk Assessment: Kyphotic   Communication Communication Communication: No apparent difficulties Factors Affecting Communication: Hearing impaired   Cognition Arousal: Alert Behavior During Therapy: WFL for tasks assessed/performed Cognition: No apparent impairments             OT - Cognition Comments: However, reported they intially when they got up this am they thought that they were in their home                 Following commands: Intact       Cueing  General Comments   Cueing Techniques: Verbal cues      Exercises     Shoulder Instructions      Home Living Family/patient expects to be discharged to:: Other (Comment)                                 Additional Comments: ILF apartment on the first floor      Prior Functioning/Environment Prior Level of Function : Independent/Modified Independent             Mobility Comments: rollator for ambulation and reported they were ambulating around facility with no assist, help for transportation ADLs Comments: mod I ADLs. does some meal prep in her apartment, has one meal in dining room. Was having some assist with med management and showers but no physical assist    OT Problem List: Decreased activity tolerance;Impaired balance (sitting and/or standing);Decreased safety awareness;Decreased knowledge of use of DME or AE   OT Treatment/Interventions: Self-care/ADL  training;Therapeutic exercise;DME and/or AE instruction;Therapeutic activities;Patient/family education;Balance training      OT Goals(Current goals can be found in the care plan section)   Acute Rehab OT Goals Patient Stated Goal: to go home OT Goal Formulation: With patient Time For Goal Achievement: 11/18/24 Potential to Achieve Goals: Good   OT Frequency:  Min 2X/week    Co-evaluation              AM-PAC OT 6 Clicks Daily Activity     Outcome Measure Help from another person eating meals?: None Help from another person taking care of personal grooming?: None Help from another person toileting, which includes using toliet, bedpan, or urinal?: A Little Help from another person bathing (including washing, rinsing, drying)?: A Little Help from another person to put on and taking off regular upper body clothing?: None Help from another person to put on and taking off regular lower body clothing?: A Little 6 Click Score: 21   End of Session Equipment Utilized During Treatment: Gait belt;Rollator (4 wheels) Nurse Communication: Mobility status  Activity Tolerance: Other (comment) (bp) Patient left: in bed;with call bell/phone within reach;with bed alarm set  OT Visit Diagnosis: Muscle weakness (generalized) (M62.81);Dizziness and giddiness (R42)  Time: 9262-9193 OT Time Calculation (min): 29 min Charges:  OT General Charges $OT Visit: 1 Visit OT Evaluation $OT Eval Low Complexity: 1 Low OT Treatments $Self Care/Home Management : 8-22 mins  Warrick POUR OTR/L  Acute Rehab Services  [phone] office number   Warrick Berber 11/04/2024, 8:17 AM

## 2024-11-04 NOTE — Assessment & Plan Note (Signed)
Rate control with metoprolol and anticoagulation with apixaban.  

## 2024-11-04 NOTE — Progress Notes (Addendum)
"  °  Progress Note  Patient Name: Zoe  MARLISE [name] Date of Encounter: 11/04/2024 [facility] Cardiologist: Gordy Bergamo, MD   Interval Summary   Chest pain resolved.  Shortness of breath markedly improved.  In/out not fully recorded, but weight reportedly down 3 kg since admission. She was a little confused when she woke up this morning and did not realize that she was still in the hospital.  Vital Signs Vitals:   11/03/24 2319 11/04/24 0324 11/04/24 0500 11/04/24 0743  BP: 106/73 107/84  104/61  Pulse: (!) 53 62    Resp: 20 19  19   Temp: 97.6 F (36.4 C) (!) 97.4 F (36.3 C)  98.1 F (36.7 C)  TempSrc: Oral Oral  Oral  SpO2: (!) 72% (!) 88%    Weight:   75.1 kg   Height:       No intake or output data in the 24 hours ending 11/04/24 0855    11/04/2024    5:00 AM 11/03/2024    7:46 PM 11/03/2024    3:59 AM  Last 3 Weights  Weight (lbs) 165 lb 8 oz 166 lb 3.6 oz 171 lb 15.3 oz  Weight (kg) 75.07 kg 75.4 kg 78 kg      Telemetry/ECG  Atrial sensed, ventricular paced- Personally Reviewed  Physical Exam  GEN: No acute distress.   Neck: No JVD Cardiac: RRR, early peaking 2-3/6 aortic ejection murmur, no systolic murmurs, rubs, or gallops.  Respiratory: Clear to auscultation bilaterally. GI: Soft, nontender, non-distended  MS: No edema  Assessment & Plan  Acute on chronic HFpEF: Early readmission following hospitalization in December.  Suspect we have never diuresed her to her true dry weight.  Plan for right heart catheterization tomorrow to optimize volume status.  Anticoagulants on hold for that procedure.  On Jardiance , ARB, metoprolol  succinate in addition to loop diuretics.  Blood pressure is low and will not allow additional heart failure medications.  Her chest pain seems to be caused by volume overload and was alleviated with diuresis. TAVR stenosis: unfortunately her valve demonstrates fairly early deterioration and she already has moderate aortic stenosis.   This may be contributing to her tendency to have heart failure, but aortic stenosis is not yet severe.  The calculated aortic valve area on her echocardiogram is an underestimation due to under measurement of the LVOT diameter.  The dimensionless valve index is 0.33 and the mean gradient is 24 mmHg, both in keeping with moderate AS.  Endocarditis prophylaxis. Parox AFib: Maintaining normal (atrial paced, ventricular sensed) rhythm.  Atrial fibrillation cannot be incriminating in the current episode of heart failure exacerbation.  Anticoagulation is on hold for catheterization tomorrow. CHB/pacemaker: She required implantation of a permanent pacemaker last year and now has 100% ventricular pacing.  However she received a left bundle branch lead and the QRS complex is quite narrow, making dyssynchrony less likely to be a cause for her heart failure. Multiple myeloma: Risk for development of cardiac amyloidosis.  .  With her age she is also at risk for TTR amyloidosis which may be causing her diastolic heart failure.  She is being followed in the oncology clinic and is on treatment with Revlimid .  Consider outpatient PYP scan.   For questions or updates, please contact [facility] Please consult [email or web address] for contact info under         Signed, Jerel Balding, MD   "

## 2024-11-04 NOTE — Telephone Encounter (Signed)
 Pts daughter requesting a c/b to discuss cath procedure

## 2024-11-04 NOTE — Telephone Encounter (Signed)
 Patient daughter returning call to nurse

## 2024-11-04 NOTE — Telephone Encounter (Signed)
 Left message for patient to call back

## 2024-11-04 NOTE — Assessment & Plan Note (Addendum)
 09/2024 echocardiogram with preserved LV systolic function with EF 55 to 60%, mild LVH, grade II diastolic dysfunction with pseudo normalization EA, RV systolic function preserved, RVSP 37.3 mmHg, LA with moderate dilatation, mild mitral regurgitation and mild mitral stenosis, sp TAVR, concerned for prosthetic valve stenosis   Continue with mild hypervolemia Urine output not documented.   Continue diuresis with furosemide  40 mg IV bid Medical therapy with losartan , metoprolol  and SGLT 2 inh.  Positive orthostatic hypotension, discontinue amlodipine  Plan for right heart catheterization     Acute hypoxemic respiratory failure due to cardiogenic pulmonary edema, no clinical sings of pneumonia (ruled out pneumonia)  Stop antibiotics Continue diuresis and oxymetry monitoring  02 saturation 88% on room air today

## 2024-11-04 NOTE — Evaluation (Addendum)
 Physical Therapy Evaluation Patient Details Name: Zoe [name] MRN: [id number] DOB: 06-17-1934 Today's Date: 11/04/2024  History of Present Illness  Pt is a  89 y.o. female who presented 11/02/24 due to chest pain, SOB and cough. CXR shows increased interstitial markings, right upper lobe predominant, suggesting residual atypical infection/pneumonia. Plan for right heart cath tomorrow, 1/14. PMHx: HFpEF, CHF, A-fib, hypothyroidism, HTN, breast CA,multiple myeloma,aortic stenosis status post TAVR, TIA, and bil TKA. Of note, pt had a recent admission 12/14-17/25 due to acute on chronic diastolic CHF.   Clinical Impression  Pt admitted with above diagnosis. PTA, pt was modI for functional mobility using a rollator and independent with the majority of ADLs. She lives alone in a ground floor apartment at [employer] [facility] [employer]. Pt currently with functional limitations due to the deficits listed below (see PT Problem List). She required supervision for bed mobility and CGA for transfers using RW. Pt is currently limited by generalized BLE weakness, decreased balance, symptomatic orthostatic hypotension, and impaired activity tolerance. Pt will benefit from acute skilled PT to increase her independence and safety with mobility to allow discharge. Anticipate pt to progress well and BP issue to resolve. Recommend HHPT to increase strength, improve balance, decrease fall risk, and optimize safety within the home environment.     11/04/24 1419  Vital Signs  Patient Position (if appropriate) Orthostatic Vitals  Orthostatic Lying   BP- Lying 108/54  Orthostatic Sitting  BP- Sitting 108/60  Orthostatic Standing at 0 minutes  BP- Standing at 0 minutes (!) 86/51  Orthostatic Standing at 3 minutes  BP- Standing at 3 minutes (!) 88/59            If plan is discharge home, recommend the following: A little help with walking and/or transfers;A little help with bathing/dressing/bathroom;Assistance  with cooking/housework;Assist for transportation;Help with stairs or ramp for entrance   Can travel by private vehicle        Equipment Recommendations None recommended by PT  Recommendations for Other Services       Functional Status Assessment Patient has had a recent decline in their functional status and demonstrates the ability to make significant improvements in function in a reasonable and predictable amount of time.     Precautions / Restrictions Precautions Precautions: Fall Recall of Precautions/Restrictions: Intact Precaution/Restrictions Comments: watch BP Restrictions Weight Bearing Restrictions Per Provider Order: No      Mobility  Bed Mobility Overal bed mobility: Needs Assistance Bed Mobility: Supine to Sit     Supine to sit: Supervision, HOB elevated     General bed mobility comments: Pt sat up on R side of bed with increased time. She pulled on PT to elevate trunk.    Transfers Overall transfer level: Needs assistance Equipment used: Rolling walker (2 wheels) Transfers: Sit to/from Stand, Bed to chair/wheelchair/BSC Sit to Stand: Contact guard assist   Step pivot transfers: Contact guard assist       General transfer comment: Pt stood from lowest bed height. Cued proper hand placement using RW. Transferred to recliner chair on her right. Good eccentric control.    Ambulation/Gait Ambulation/Gait assistance: Contact guard assist Gait Distance (Feet): 3 Feet Assistive device: Rolling walker (2 wheels) Gait Pattern/deviations: Step-to pattern Gait velocity: decr     General Gait Details: Pt took a couple of steps to transfer to recliner chair. Upright posture with good proximity to AD. Distance limited d/t symptomatic orthostatic hypotension.  Stairs  Wheelchair Mobility     Tilt Bed    Modified Rankin (Stroke Patients Only)       Balance Overall balance assessment: Needs assistance Sitting-balance support: Feet  supported Sitting balance-Leahy Scale: Good Sitting balance - Comments: Sat EOB   Standing balance support: Bilateral upper extremity supported, No upper extremity supported Standing balance-Leahy Scale: Poor Standing balance comment: Pt dependent on RW and CGA.                             Pertinent Vitals/Pain Pain Assessment Pain Assessment: No/denies pain    Home Living Family/patient expects to be discharged to:: Other (Comment)                   Additional Comments: ILF apartment on the first floor    Prior Function Prior Level of Function : Independent/Modified Independent             Mobility Comments: rollator for ambulation and reported they were ambulating around facility with no assist, help for transportation ADLs Comments: mod I ADLs. does some meal prep in her apartment, has one meal in dining room. Was having some assist with med management and showers but no physical assist. Daughter-in-law and friends provide transportation when needed.     Extremity/Trunk Assessment   Upper Extremity Assessment Upper Extremity Assessment: Defer to OT evaluation    Lower Extremity Assessment Lower Extremity Assessment: Generalized weakness    Cervical / Trunk Assessment Cervical / Trunk Assessment: Kyphotic  Communication   Communication Communication: No apparent difficulties Factors Affecting Communication: Hearing impaired    Cognition Arousal: Alert Behavior During Therapy: WFL for tasks assessed/performed   PT - Cognitive impairments: No family/caregiver present to determine baseline, No apparent impairments                       PT - Cognition Comments: Pt A,Ox4 Following commands: Intact       Cueing Cueing Techniques: Verbal cues     General Comments General comments (skin integrity, edema, etc.): Orthostatic vitals taken, (+) and pt symptomatic c/o dizziness.    Exercises     Assessment/Plan    PT Assessment  Patient needs continued PT services  PT Problem List Decreased strength;Decreased activity tolerance;Decreased balance;Decreased mobility;Cardiopulmonary status limiting activity       PT Treatment Interventions DME instruction;Gait training;Functional mobility training;Therapeutic activities;Therapeutic exercise;Balance training;Patient/family education    PT Goals (Current goals can be found in the Care Plan section)  Acute Rehab PT Goals Patient Stated Goal: Return Home PT Goal Formulation: With patient Time For Goal Achievement: 11/18/24 Potential to Achieve Goals: Good    Frequency Min 2X/week     Co-evaluation               AM-PAC PT 6 Clicks Mobility  Outcome Measure Help needed turning from your back to your side while in a flat bed without using bedrails?: A Little Help needed moving from lying on your back to sitting on the side of a flat bed without using bedrails?: A Little Help needed moving to and from a bed to a chair (including a wheelchair)?: A Little Help needed standing up from a chair using your arms (e.g., wheelchair or bedside chair)?: A Little Help needed to walk in hospital room?: A Lot Help needed climbing 3-5 steps with a railing? : A Lot 6 Click Score: 16    End of Session Equipment Utilized  During Treatment: Gait belt Activity Tolerance: Treatment limited secondary to medical complications (Comment) (symptomatic orthostatic hypotension.) Patient left: in chair;with call bell/phone within reach;with chair alarm set Nurse Communication: Mobility status;Other (comment) (BP response, (+) OH) PT Visit Diagnosis: Muscle weakness (generalized) (M62.81);Difficulty in walking, not elsewhere classified (R26.2);Other abnormalities of gait and mobility (R26.89)    Time: 8653-8588 PT Time Calculation (min) (ACUTE ONLY): 25 min   Charges:   PT Evaluation $PT Eval Moderate Complexity: 1 Mod PT Treatments $Therapeutic Activity: 8-22 mins PT General  Charges $$ ACUTE PT VISIT: 1 Visit         Randall SAUNDERS, PT, DPT Acute Rehabilitation Services Office: [phone] Secure Chat Preferred  Delon CHRISTELLA Callander 11/04/2024, 2:27 PM

## 2024-11-05 ENCOUNTER — Encounter (HOSPITAL_COMMUNITY): Payer: Self-pay | Admitting: Cardiology

## 2024-11-05 ENCOUNTER — Inpatient Hospital Stay (HOSPITAL_COMMUNITY)

## 2024-11-05 ENCOUNTER — Encounter (HOSPITAL_COMMUNITY)
Admission: EM | Disposition: A | Discharge status: Home Health | Payer: Self-pay | Source: Home / Self Care | Attending: Internal Medicine

## 2024-11-05 DIAGNOSIS — I5033 Acute on chronic diastolic (congestive) heart failure: Secondary | ICD-10-CM | POA: Diagnosis not present

## 2024-11-05 DIAGNOSIS — I509 Heart failure, unspecified: Secondary | ICD-10-CM

## 2024-11-05 HISTORY — PX: RIGHT HEART CATH: CATH118263

## 2024-11-05 LAB — GLUCOSE, CAPILLARY: Glucose-Capillary: 110 mg/dL — ABNORMAL HIGH (ref 70–99)

## 2024-11-05 LAB — POCT I-STAT EG7
Acid-Base Excess: 2 mmol/L (ref 0.0–2.0)
Acid-Base Excess: 2 mmol/L (ref 0.0–2.0)
Bicarbonate: 26.3 mmol/L (ref 20.0–28.0)
Bicarbonate: 26.4 mmol/L (ref 20.0–28.0)
Calcium, Ion: 1.15 mmol/L (ref 1.15–1.40)
Calcium, Ion: 1.16 mmol/L (ref 1.15–1.40)
HCT: 35 % — ABNORMAL LOW (ref 36.0–46.0)
HCT: 36 % (ref 36.0–46.0)
Hemoglobin: 11.9 g/dL — ABNORMAL LOW (ref 12.0–15.0)
Hemoglobin: 12.2 g/dL (ref 12.0–15.0)
O2 Saturation: 57 %
O2 Saturation: 60 %
Potassium: 4.1 mmol/L (ref 3.5–5.1)
Potassium: 4.2 mmol/L (ref 3.5–5.1)
Sodium: 136 mmol/L (ref 135–145)
Sodium: 136 mmol/L (ref 135–145)
TCO2: 28 mmol/L (ref 22–32)
TCO2: 28 mmol/L (ref 22–32)
pCO2, Ven: 40.9 mmHg — ABNORMAL LOW (ref 44–60)
pCO2, Ven: 41.6 mmHg — ABNORMAL LOW (ref 44–60)
pH, Ven: 7.411 (ref 7.25–7.43)
pH, Ven: 7.417 (ref 7.25–7.43)
pO2, Ven: 30 mmHg — CL (ref 32–45)
pO2, Ven: 31 mmHg — CL (ref 32–45)

## 2024-11-05 LAB — SEDIMENTATION RATE: Sed Rate: 28 mm/h — ABNORMAL HIGH (ref 0–22)

## 2024-11-05 LAB — BASIC METABOLIC PANEL WITH GFR
Anion gap: 13 (ref 5–15)
BUN: 43 mg/dL — ABNORMAL HIGH (ref 8–23)
CO2: 27 mmol/L (ref 22–32)
Calcium: 8.4 mg/dL — ABNORMAL LOW (ref 8.9–10.3)
Chloride: 100 mmol/L (ref 98–111)
Creatinine, Ser: 2.03 mg/dL — ABNORMAL HIGH (ref 0.44–1.00)
GFR, Estimated: 23 mL/min — ABNORMAL LOW
Glucose, Bld: 98 mg/dL (ref 70–99)
Potassium: 4 mmol/L (ref 3.5–5.1)
Sodium: 139 mmol/L (ref 135–145)

## 2024-11-05 LAB — URIC ACID: Uric Acid, Serum: 7.1 mg/dL (ref 2.5–7.1)

## 2024-11-05 LAB — MAGNESIUM: Magnesium: 2.3 mg/dL (ref 1.7–2.4)

## 2024-11-05 MED ORDER — LIDOCAINE HCL (PF) 1 % IJ SOLN
INTRAMUSCULAR | Status: AC
  Filled 2024-11-05: qty 30

## 2024-11-05 MED ORDER — PREDNISONE 20 MG PO TABS
40.0000 mg | ORAL_TABLET | Freq: Every day | ORAL | Status: AC
  Administered 2024-11-05 – 2024-11-06 (×2): 40 mg via ORAL
  Filled 2024-11-05 (×2): qty 2

## 2024-11-05 MED ORDER — FREE WATER
250.0000 mL | Freq: Once | Status: AC
  Administered 2024-11-05: 250 mL via ORAL

## 2024-11-05 MED ORDER — HEPARIN (PORCINE) IN NACL 1000-0.9 UT/500ML-% IV SOLN
INTRAVENOUS | Status: DC | PRN
  Administered 2024-11-05: 500 mL

## 2024-11-05 MED ORDER — LIDOCAINE HCL (PF) 1 % IJ SOLN
INTRAMUSCULAR | Status: DC | PRN
  Administered 2024-11-05: 2 mL via INTRADERMAL

## 2024-11-05 MED ORDER — ROSUVASTATIN CALCIUM 5 MG PO TABS
10.0000 mg | ORAL_TABLET | Freq: Every day | ORAL | Status: DC
  Administered 2024-11-06: 10 mg via ORAL
  Filled 2024-11-05: qty 2

## 2024-11-05 NOTE — Progress Notes (Signed)
 PT Cancellation Note  Patient Details Name: Zoe [name] MRN: [id number] DOB: May 19, 1934   Cancelled Treatment:    Reason Eval/Treat Not Completed: Other (comment) (Pt declines reporting she's about to go to a procedure (RHC). Plan to follow up when able.)  Zoe [name] DPT Acute Rehab Services [phone] Prefer contact via chat   Zoe KATHEE Traveon Louro 11/05/2024, 3:05 PM

## 2024-11-05 NOTE — Progress Notes (Addendum)
 " Progress Note  Patient Name: Zoe  KATHEE [name] Date of Encounter: 11/05/2024  Primary Cardiologist: Gordy Bergamo, MD  Subjective   No CP or SOB. Concerned about some right forearm discomfort going up into her hand worse when she squeezes her grip, still has full ROM but some guarding. Some ecchymosis to side of forearm. Neurovascularly intact. No other focal stroke symptoms. Reports this seemed to start after nurse put tape on her arm overnight. A+Ox3.  Inpatient Medications    Scheduled Meds:  acyclovir   400 mg Oral BID   apixaban   5 mg Oral BID   chlorpheniramine-HYDROcodone   5 mL Oral Q12H   cholecalciferol   1,000 Units Oral q AM   cyanocobalamin   1,000 mcg Oral q AM   empagliflozin   10 mg Oral Daily   free water   250 mL Oral Once   gabapentin   100 mg Oral TID   guaiFENesin   10 mL Oral Q8H   levothyroxine   100 mcg Oral Q0600   losartan   50 mg Oral QPM   metoprolol  succinate  50 mg Oral Daily   pantoprazole   40 mg Oral Daily   polyethylene glycol  17 g Oral Daily   rosuvastatin   20 mg Oral Daily   sodium chloride  flush  3 mL Intravenous Q12H   sodium chloride  flush  3 mL Intravenous Q12H   Continuous Infusions:  PRN Meds: acetaminophen  **OR** acetaminophen , benzonatate , ipratropium, melatonin, ondansetron  **OR** ondansetron  (ZOFRAN ) IV, senna-docusate, sodium phosphate , traZODone    Vital Signs    Vitals:   11/04/24 1900 11/04/24 2300 11/05/24 0300 11/05/24 0759  BP: (!) 102/47 (!) 105/50 111/69 114/74  Pulse: 60 62 67   Resp: 20 17 20    Temp: 97.8 F (36.6 C) 98.4 F (36.9 C) 98.4 F (36.9 C) 97.8 F (36.6 C)  TempSrc: Oral Oral Oral Oral  SpO2: 96% 95% 91%   Weight:   75.6 kg   Height:   5' 4 (1.626 m)     Intake/Output Summary (Last 24 hours) at 11/05/2024 0913 Last data filed at 11/05/2024 0355 Gross per 24 hour  Intake 420 ml  Output 1600 ml  Net -1180 ml      11/05/2024    3:00 AM 11/04/2024    5:00 AM 11/03/2024    7:46 PM  Last 3 Weights   Weight (lbs) 166 lb 10.7 oz 165 lb 8 oz 166 lb 3.6 oz  Weight (kg) 75.6 kg 75.07 kg 75.4 kg     Telemetry    Atrial sensed V paced rhythm - Personally Reviewed  Physical Exam   GEN: No acute distress.  HEENT: Normocephalic, atraumatic, sclera non-icteric. Neck: No JVD or bruits. Cardiac: RRR no murmurs, rubs, or gallops.  Respiratory: Clear to auscultation bilaterally. Breathing is unlabored. GI: Soft, nontender, non-distended, BS +x 4. MS: no deformity. Extremities: No clubbing or cyanosis. No edema. Distal pedal pulses are 2+ and equal bilaterally. Tenderness of right forearm up into palm with ecchymosis to side of forearm, thin skin Neuro:  AAOx3. Follows commands. Full ROM. Hand grip is guarded due to pain. Psych:  Responds to questions appropriately with a normal affect.  Labs    High Sensitivity Troponin:  No results for input(s): TROPONINIHS in the last 720 hours.    Cardiac EnzymesNo results for input(s): TROPONINI in the last 168 hours. No results for input(s): TROPIPOC in the last 168 hours.   Chemistry Recent Labs  Lab 11/03/24 0030 11/04/24 0222 11/05/24 0312  NA 139 138 139  K  4.0 3.7 4.0  CL 106 100 100  CO2 22 25 27   GLUCOSE 93 99 98  BUN 21 26* 43*  CREATININE 1.12* 1.54* 2.03*  CALCIUM  8.3* 8.4* 8.4*  PROT 6.1*  --   --   ALBUMIN 3.3*  --   --   AST 15  --   --   ALT 6  --   --   ALKPHOS 88  --   --   BILITOT 0.7  --   --   GFRNONAA 47* 32* 23*  ANIONGAP 10 13 13      Hematology Recent Labs  Lab 11/03/24 0030 11/04/24 0222  WBC 4.3 5.3  RBC 3.34* 3.84*  HGB 9.2* 10.4*  HCT 30.0* 33.1*  MCV 89.8 86.2  MCH 27.5 27.1  MCHC 30.7 31.4  RDW 17.8* 17.9*  PLT 141* 141*    BNP Recent Labs  Lab 11/03/24 0030  PROBNP 1,478.0*     DDimer No results for input(s): DDIMER in the last 168 hours.   Radiology    No results found.  Cardiac Studies   2d echo 10/06/25   1. Left ventricular ejection fraction, by estimation, is 55  to 60%. The  left ventricle has normal function. The left ventricle has no regional  wall motion abnormalities. There is mild concentric left ventricular  hypertrophy. Left ventricular diastolic  parameters are consistent with Grade II diastolic dysfunction  (pseudonormalization). Elevated left atrial pressure.   2. Right ventricular systolic function is normal. The right ventricular  size is normal. There is mildly elevated pulmonary artery systolic  pressure. The estimated right ventricular systolic pressure is 37.3 mmHg.   3. Left atrial size was moderately dilated.   4. The mitral valve is degenerative. Mild mitral valve regurgitation.  Mild mitral stenosis. The mean mitral valve gradient is 4.0 mmHg with  average heart rate of 65 bpm. Moderate to severe mitral annular  calcification.   5. Prothetic valve well seated with calcified leaflet and hemodynamics  concerning for prosthetic valve stenosis (PV 3.45 m/sec, MG 28 mmHg, and  DVI 0.31). Trivial transvalvular regurgitation. The aortic valve has been  repaired/replaced. Aortic valve  regurgitation is trivial. There is a 23 mm Sapien prosthetic (TAVR) valve  present in the aortic position. Procedure Date: 09/03/2018.   Comparison(s): Prior images reviewed side by side. Gradient across  prosthetic valve is higher.   Patient Profile     89 y.o. 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 Chest pain with low/flat troponins - chest pain episodes were brief and short lived, with low/flat troponins  - cath negative for  CAD in 2019 - Dr. Francyne felt symptoms may be due to CHF prompting diuresis this admission - follow creatinine for statin - if CrCl remains <30 will need to reduce rosuvastatin  or change to atorvastatin   2. Shortness of breath/orthopnea with mild acute on chronic HFpEF, also with history of severe AS s/p TAVR with possible prosthetic stenosis by echo 09/2024, with orthostasis 11/05/23 - pBNP elevated at 1478 (was still up at dc 12/17) in setting of mild CKD - s/p IV Lasix  with weight  171->166, 2.6L net output  - for RHC today - hold Lasix /losartan  today given rise in creatinine to 2 - was also orthostatic yesterday to SBP 80s prompting discontinuation of amlodipine  in [facility] but had not received since admission. She also had not gotten losartan  until yesterday afternoon - given hx of MM, peripheral neuropathy, AS, CHF, consider OP amyloid workup  Informed Consent   Shared Decision Making/Informed Consent The risks, including but not limited to, [bleeding or vascular complications (1 in 500), pneumothorax (1 in 1600), arrhythmia (1 in 1000) and death (1 in 5000)], benefits (diagnostic support and/or management of heart failure, pulmonary hypertension) and alternatives of a right heart catheterization were discussed in detail with Ms. Aguon and she is willing to proceed. She is A+Ox3 and able to consent.     3. Abnormal CXR ?persistent infiltrates - resp panels negative, query utility of high res CT, will defer to MD   4. Paroxysmal atrial fibrillation as well as hx of CHB s/p PPM  - maintaining normal rhythm (AV paced, as well as atrial sensed V paced this admission) - remains on Eliquis  5mg  BID - dose was appropriate PTA but now with Cr of 2, will need to follow for possible need to reduce dosing - hold for RHC today (last dose last night) - amiodarone  previously d/c due to interstitial changes, ?role of high res CT  - maintaining NSR on Toprol  50mg  daily - per MD, she received a left  bundle branch lead and the QRS complex is quite narrow, making dyssynchrony less likely to be a cause for her heart failure   5. AKI on CKD stage 3b - Cr rising to 2 today, hold further IV Lasix  and hold losartan  today - BMET in AM   6. Right arm pain (tenderness to palpation) - per discussion with Dr. Francyne, should not impacts plan to proceed with RHC cath - no other focal neurologic deficits - mgmt per primary team  7. Anemia/thrombocytopenia - appears chronic  8. Hypothyroidism  - TSH OK  For questions or updates, please contact [facility] Please consult [email or web address] for contact info under Cardiology/STEMI.  Signed, Raphael LOISE Bring, PA-C 11/05/2024, 9:13 AM     I have seen and examined the patient along with Dayna N Dunn, PA-C.  I have reviewed the chart, notes and new data.  I agree with PA/NP's note.  Key new complaints: Denies dyspnea or chest pain.  Complains of exquisite pain in her right wrist.  Denies any injury but she does have a bruise in that area.  She is thirsty and has a dry mouth. Key examination changes: Superficial ecchymosis lateral surface of right wrist.The wrist is very painful to mobilization either actively or passively.  She does not have a neurological deficit. Key new findings / data: Worsening renal function suggesting we have reached the limits with diuresis.  PLAN: Stop diuretics.  Right heart catheterization today.   If there was no injury to her right wrist, suspect she may have gout or pseudogout of the right wrist precipitated by aggressive diuresis.  Uric acid has been ordered.  Informed Consent   Shared Decision Making/Informed Consent The risks, including but not limited to, [bleeding or vascular complications (1 in 500), pneumothorax (1 in 1600), arrhythmia (1 in 1000) and death (1 in 5000)], benefits (diagnostic support and/or management of heart failure, pulmonary hypertension) and alternatives of a right heart catheterization  were discussed in detail with Ms. Mattson and she is willing to proceed.  Jerel Balding, MD, [facility] [phone] 11/05/2024, 12:42 PM  "

## 2024-11-05 NOTE — Interval H&P Note (Signed)
 History and Physical Interval Note:  11/05/2024 5:25 PM  Zoe [name]  has presented today for surgery, with the diagnosis of heart failure.  The various methods of treatment have been discussed with the patient and family. After consideration of risks, benefits and other options for treatment, the patient has consented to  Procedures: RIGHT HEART CATH (N/A) as a surgical intervention.  The patient's history has been reviewed, patient examined, no change in status, stable for surgery.  I have reviewed the patient's chart and labs.  Questions were answered to the patient's satisfaction.     Gordy Bergamo

## 2024-11-05 NOTE — Progress Notes (Signed)
" °   11/03/24 0031  [REMOVED] Peripheral IV 11/03/24 20 G Anterior;Right Forearm  Removal Date/Time: 11/05/24 1055  Placement Date/Time: 11/03/24 0031   Size (Gauge): 20 G  Orientation: Anterior;Right  Location: Forearm  Site Assessment Clean, Dry, Intact  Line Status Flushed  Dressing Type Transparent  Dressing Status Clean, Dry, Intact   Lost assesses over night.  "

## 2024-11-05 NOTE — H&P (View-Only) (Signed)
 " Progress Note  Patient Name: Zoe [name]  KATHEE Claude Date of Encounter: 11/05/2024  Primary Cardiologist: Gordy Bergamo, MD  Subjective   No CP or SOB. Concerned about some right forearm discomfort going up into her hand worse when she squeezes her grip, still has full ROM but some guarding. Some ecchymosis to side of forearm. Neurovascularly intact. No other focal stroke symptoms. Reports this seemed to start after nurse put tape on her arm overnight. A+Ox3.  Inpatient Medications    Scheduled Meds:  acyclovir   400 mg Oral BID   apixaban   5 mg Oral BID   chlorpheniramine-HYDROcodone   5 mL Oral Q12H   cholecalciferol   1,000 Units Oral q AM   cyanocobalamin   1,000 mcg Oral q AM   empagliflozin   10 mg Oral Daily   free water   250 mL Oral Once   gabapentin   100 mg Oral TID   guaiFENesin   10 mL Oral Q8H   levothyroxine   100 mcg Oral Q0600   losartan   50 mg Oral QPM   metoprolol  succinate  50 mg Oral Daily   pantoprazole   40 mg Oral Daily   polyethylene glycol  17 g Oral Daily   rosuvastatin   20 mg Oral Daily   sodium chloride  flush  3 mL Intravenous Q12H   sodium chloride  flush  3 mL Intravenous Q12H   Continuous Infusions:  PRN Meds: acetaminophen  **OR** acetaminophen , benzonatate , ipratropium, melatonin, ondansetron  **OR** ondansetron  (ZOFRAN ) IV, senna-docusate, sodium phosphate , traZODone    Vital Signs    Vitals:   11/04/24 1900 11/04/24 2300 11/05/24 0300 11/05/24 0759  BP: (!) 102/47 (!) 105/50 111/69 114/74  Pulse: 60 62 67   Resp: 20 17 20    Temp: 97.8 F (36.6 C) 98.4 F (36.9 C) 98.4 F (36.9 C) 97.8 F (36.6 C)  TempSrc: Oral Oral Oral Oral  SpO2: 96% 95% 91%   Weight:   75.6 kg   Height:   5' 4 (1.626 m)     Intake/Output Summary (Last 24 hours) at 11/05/2024 0913 Last data filed at 11/05/2024 0355 Gross per 24 hour  Intake 420 ml  Output 1600 ml  Net -1180 ml      11/05/2024    3:00 AM 11/04/2024    5:00 AM 11/03/2024    7:46 PM  Last 3 Weights   Weight (lbs) 166 lb 10.7 oz 165 lb 8 oz 166 lb 3.6 oz  Weight (kg) 75.6 kg 75.07 kg 75.4 kg     Telemetry    Atrial sensed V paced rhythm - Personally Reviewed  Physical Exam   GEN: No acute distress.  HEENT: Normocephalic, atraumatic, sclera non-icteric. Neck: No JVD or bruits. Cardiac: RRR no murmurs, rubs, or gallops.  Respiratory: Clear to auscultation bilaterally. Breathing is unlabored. GI: Soft, nontender, non-distended, BS +x 4. MS: no deformity. Extremities: No clubbing or cyanosis. No edema. Distal pedal pulses are 2+ and equal bilaterally. Tenderness of right forearm up into palm with ecchymosis to side of forearm, thin skin Neuro:  AAOx3. Follows commands. Full ROM. Hand grip is guarded due to pain. Psych:  Responds to questions appropriately with a normal affect.  Labs    High Sensitivity Troponin:  No results for input(s): TROPONINIHS in the last 720 hours.    Cardiac EnzymesNo results for input(s): TROPONINI in the last 168 hours. No results for input(s): TROPIPOC in the last 168 hours.   Chemistry Recent Labs  Lab 11/03/24 0030 11/04/24 0222 11/05/24 0312  NA 139 138 139  K  4.0 3.7 4.0  CL 106 100 100  CO2 22 25 27   GLUCOSE 93 99 98  BUN 21 26* 43*  CREATININE 1.12* 1.54* 2.03*  CALCIUM  8.3* 8.4* 8.4*  PROT 6.1*  --   --   ALBUMIN 3.3*  --   --   AST 15  --   --   ALT 6  --   --   ALKPHOS 88  --   --   BILITOT 0.7  --   --   GFRNONAA 47* 32* 23*  ANIONGAP 10 13 13      Hematology Recent Labs  Lab 11/03/24 0030 11/04/24 0222  WBC 4.3 5.3  RBC 3.34* 3.84*  HGB 9.2* 10.4*  HCT 30.0* 33.1*  MCV 89.8 86.2  MCH 27.5 27.1  MCHC 30.7 31.4  RDW 17.8* 17.9*  PLT 141* 141*    BNP Recent Labs  Lab 11/03/24 0030  PROBNP 1,478.0*     DDimer No results for input(s): DDIMER in the last 168 hours.   Radiology    No results found.  Cardiac Studies   2d echo 10/06/25   1. Left ventricular ejection fraction, by estimation, is 55  to 60%. The  left ventricle has normal function. The left ventricle has no regional  wall motion abnormalities. There is mild concentric left ventricular  hypertrophy. Left ventricular diastolic  parameters are consistent with Grade II diastolic dysfunction  (pseudonormalization). Elevated left atrial pressure.   2. Right ventricular systolic function is normal. The right ventricular  size is normal. There is mildly elevated pulmonary artery systolic  pressure. The estimated right ventricular systolic pressure is 37.3 mmHg.   3. Left atrial size was moderately dilated.   4. The mitral valve is degenerative. Mild mitral valve regurgitation.  Mild mitral stenosis. The mean mitral valve gradient is 4.0 mmHg with  average heart rate of 65 bpm. Moderate to severe mitral annular  calcification.   5. Prothetic valve well seated with calcified leaflet and hemodynamics  concerning for prosthetic valve stenosis (PV 3.45 m/sec, MG 28 mmHg, and  DVI 0.31). Trivial transvalvular regurgitation. The aortic valve has been  repaired/replaced. Aortic valve  regurgitation is trivial. There is a 23 mm Sapien prosthetic (TAVR) valve  present in the aortic position. Procedure Date: 09/03/2018.   Comparison(s): Prior images reviewed side by side. Gradient across  prosthetic valve is higher.   Patient Profile     89 y.o. 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 Chest pain with low/flat troponins - chest pain episodes were brief and short lived, with low/flat troponins  - cath negative for  CAD in 2019 - Dr. Francyne felt symptoms may be due to CHF prompting diuresis this admission - follow creatinine for statin - if CrCl remains <30 will need to reduce rosuvastatin  or change to atorvastatin   2. Shortness of breath/orthopnea with mild acute on chronic HFpEF, also with history of severe AS s/p TAVR with possible prosthetic stenosis by echo 09/2024, with orthostasis 11/05/23 - pBNP elevated at 1478 (was still up at dc 12/17) in setting of mild CKD - s/p IV Lasix  with weight  171->166, 2.6L net output  - for RHC today - hold Lasix /losartan  today given rise in creatinine to 2 - was also orthostatic yesterday to SBP 80s prompting discontinuation of amlodipine  in [facility] but had not received since admission. She also had not gotten losartan  until yesterday afternoon - given hx of MM, peripheral neuropathy, AS, CHF, consider OP amyloid workup  Informed Consent   Shared Decision Making/Informed Consent The risks, including but not limited to, [bleeding or vascular complications (1 in 500), pneumothorax (1 in 1600), arrhythmia (1 in 1000) and death (1 in 5000)], benefits (diagnostic support and/or management of heart failure, pulmonary hypertension) and alternatives of a right heart catheterization were discussed in detail with Ms. Aguon and she is willing to proceed. She is A+Ox3 and able to consent.     3. Abnormal CXR ?persistent infiltrates - resp panels negative, query utility of high res CT, will defer to MD   4. Paroxysmal atrial fibrillation as well as hx of CHB s/p PPM  - maintaining normal rhythm (AV paced, as well as atrial sensed V paced this admission) - remains on Eliquis  5mg  BID - dose was appropriate PTA but now with Cr of 2, will need to follow for possible need to reduce dosing - hold for RHC today (last dose last night) - amiodarone  previously d/c due to interstitial changes, ?role of high res CT  - maintaining NSR on Toprol  50mg  daily - per MD, she received a left  bundle branch lead and the QRS complex is quite narrow, making dyssynchrony less likely to be a cause for her heart failure   5. AKI on CKD stage 3b - Cr rising to 2 today, hold further IV Lasix  and hold losartan  today - BMET in AM   6. Right arm pain (tenderness to palpation) - per discussion with Dr. Francyne, should not impacts plan to proceed with RHC cath - no other focal neurologic deficits - mgmt per primary team  7. Anemia/thrombocytopenia - appears chronic  8. Hypothyroidism  - TSH OK  For questions or updates, please contact [facility] Please consult [email or web address] for contact info under Cardiology/STEMI.  Signed, Raphael LOISE Bring, PA-C 11/05/2024, 9:13 AM     I have seen and examined the patient along with Dayna N Dunn, PA-C.  I have reviewed the chart, notes and new data.  I agree with PA/NP's note.  Key new complaints: Denies dyspnea or chest pain.  Complains of exquisite pain in her right wrist.  Denies any injury but she does have a bruise in that area.  She is thirsty and has a dry mouth. Key examination changes: Superficial ecchymosis lateral surface of right wrist.The wrist is very painful to mobilization either actively or passively.  She does not have a neurological deficit. Key new findings / data: Worsening renal function suggesting we have reached the limits with diuresis.  PLAN: Stop diuretics.  Right heart catheterization today.   If there was no injury to her right wrist, suspect she may have gout or pseudogout of the right wrist precipitated by aggressive diuresis.  Uric acid has been ordered.  Informed Consent   Shared Decision Making/Informed Consent The risks, including but not limited to, [bleeding or vascular complications (1 in 500), pneumothorax (1 in 1600), arrhythmia (1 in 1000) and death (1 in 5000)], benefits (diagnostic support and/or management of heart failure, pulmonary hypertension) and alternatives of a right heart catheterization  were discussed in detail with Ms. Mattson and she is willing to proceed.  Jerel Balding, MD, [facility] [phone] 11/05/2024, 12:42 PM  "

## 2024-11-05 NOTE — Progress Notes (Addendum)
 " PROGRESS NOTE    Zoe  B [name]  FMW:[id number] DOB: 04/17/1934 DOA: 11/02/2024 PCP: Gerome Brunet, DO  82/F with history of diastolic CHF, TAVR, TIA, A-fib, PPM, hypertension, CKD 3B, hypothyroidism, multiple myeloma, anemia of chronic disease presented to the ED with dyspnea cough chest pain. - Recently hospitalized 12/14-17 for CHF, discharged on Lasix  20 mg 3 times a week - Readmitted 1/11 with CHF exacerbation, creatinine 1.1, up BNP 1478, troponin 30, hemoglobin 9.2, chest x-ray with cardiomegaly pulmonary vascular congestion - Admitted, cards following, started on diuretics, plan for RHC - 1/14 with severe right wrist pain   Subjective: -Complains of severe pain in her right hand, wrist, reports having a tape there  Assessment and Plan:  Acute on chronic diastolic heart failure - Echo 87/74 noted EF of 55-60%, mild LVH, grade 2 DD, preserved RV, TAVR, concern for prosthetic valve stenosis -Improved with diuresis, 2.5 L negative, weight down 5 LB - Cards following, uptrending creatinine to 2.0 today, holding Lasix  and losartan  - Continue metoprolol , Jardiance  - Plan for RHC today  Prosthetic aortic valve stenosis - History of TAVR 11/19 - Per cardiology  Acute hypoxemic respiratory failure due to cardiogenic pulmonary edema, no clinical sings of pneumonia (ruled out pneumonia)  -Antibiotics discontinued  Right wrist pain, tenderness - Limited range of motion on exam, check x-ray - Gout flare is also possibility check ESR and uric acid - Cold compress - If x-ray is unremarkable will treat empirically as gout flare, at risk of this considering multiple myeloma and CHF, diuretics  Hypertension -See discussion above,  Paroxysmal atrial fibrillation (HCC) -Currently in sinus rhythm, continue metoprolol  and apixaban    AKI on CKD 3B -Baseline creatinine around 1.5, 1.6, creatinine up to 2.0 today, holding further Lasix  and losartan , follow-up  RHC  Hyperlipidemia Continue rosuvastatin   Hypothyroidism Continue Synthroid   Multiple myeloma (HCC) Monitor closely, stable Continue empiric acyclovir , Lenalidomide   GERD (gastroesophageal reflux disease) Continue PPI  History of stroke Continue secondary prevention, statins, Eliquis ,  DVT prophylaxis: Eliquis  Code Status: Full code Family Communication: None present Disposition Plan: Home in few days  Consultants:    Procedures:   Antimicrobials:    Objective: Vitals:   11/04/24 1900 11/04/24 2300 11/05/24 0300 11/05/24 0759  BP: (!) 102/47 (!) 105/50 111/69 114/74  Pulse: 60 62 67   Resp: 20 17 20    Temp: 97.8 F (36.6 C) 98.4 F (36.9 C) 98.4 F (36.9 C) 97.8 F (36.6 C)  TempSrc: Oral Oral Oral Oral  SpO2: 96% 95% 91%   Weight:   75.6 kg   Height:   5' 4 (1.626 m)     Intake/Output Summary (Last 24 hours) at 11/05/2024 1205 Last data filed at 11/05/2024 0355 Gross per 24 hour  Intake 420 ml  Output 1600 ml  Net -1180 ml   Filed Weights   11/03/24 1946 11/04/24 0500 11/05/24 0300  Weight: 75.4 kg 75.1 kg 75.6 kg    Examination:  General exam: Appears calm and comfortable, AO x 3 Respiratory system: Clear to auscultation Cardiovascular system: S1 & S2 heard, RRR.  Abd: nondistended, soft and nontender.Normal bowel sounds heard. Central nervous system: Alert and oriented. No focal neurological deficits. Extremities: no edema, right wrist with swelling, tenderness Skin: No rashes Psychiatry:  Mood & affect appropriate.     Data Reviewed:   CBC: Recent Labs  Lab 11/03/24 0030 11/04/24 0222  WBC 4.3 5.3  NEUTROABS 1.9  --   HGB 9.2* 10.4*  HCT 30.0* 33.1*  MCV 89.8 86.2  PLT 141* 141*   Basic Metabolic Panel: Recent Labs  Lab 11/03/24 0030 11/03/24 0220 11/04/24 0221 11/04/24 0222 11/05/24 0312  NA 139  --   --  138 139  K 4.0  --   --  3.7 4.0  CL 106  --   --  100 100  CO2 22  --   --  25 27  GLUCOSE 93  --   --  99 98   BUN 21  --   --  26* 43*  CREATININE 1.12*  --   --  1.54* 2.03*  CALCIUM  8.3*  --   --  8.4* 8.4*  MG  --  1.6* 2.2  --  2.3  PHOS  --  3.8  --   --   --    GFR: Estimated Creatinine Clearance: 19.1 mL/min (A) (by C-G formula based on SCr of 2.03 mg/dL (H)). Liver Function Tests: Recent Labs  Lab 11/03/24 0030  AST 15  ALT 6  ALKPHOS 88  BILITOT 0.7  PROT 6.1*  ALBUMIN 3.3*   No results for input(s): LIPASE, AMYLASE in the last 168 hours. No results for input(s): AMMONIA in the last 168 hours. Coagulation Profile: No results for input(s): INR, PROTIME in the last 168 hours. Cardiac Enzymes: No results for input(s): CKTOTAL, CKMB, CKMBINDEX, TROPONINI in the last 168 hours. BNP (last 3 results) Recent Labs    10/08/24 0655 11/03/24 0030  PROBNP 587.0* 1,478.0*   HbA1C: No results for input(s): HGBA1C in the last 72 hours. CBG: Recent Labs  Lab 11/04/24 0808 11/05/24 0757  GLUCAP 120* 110*   Lipid Profile: No results for input(s): CHOL, HDL, LDLCALC, TRIG, CHOLHDL, LDLDIRECT in the last 72 hours. Thyroid  Function Tests: Recent Labs    11/04/24 0222  TSH 1.380   Anemia Panel: No results for input(s): VITAMINB12, FOLATE, FERRITIN, TIBC, IRON, RETICCTPCT in the last 72 hours. Urine analysis:    Component Value Date/Time   COLORURINE STRAW (A) 01/08/2024 1557   APPEARANCEUR CLEAR 01/08/2024 1557   LABSPEC 1.005 01/08/2024 1557   PHURINE 7.0 01/08/2024 1557   GLUCOSEU NEGATIVE 01/08/2024 1557   HGBUR NEGATIVE 01/08/2024 1557   BILIRUBINUR NEGATIVE 01/08/2024 1557   BILIRUBINUR negative 07/21/2023 1252   KETONESUR NEGATIVE 01/08/2024 1557   PROTEINUR NEGATIVE 01/08/2024 1557   UROBILINOGEN 0.2 07/21/2023 1252   NITRITE NEGATIVE 01/08/2024 1557   LEUKOCYTESUR NEGATIVE 01/08/2024 1557   Sepsis Labs: @LABRCNTIP (procalcitonin:4,lacticidven:4)  ) Recent Results (from the past 240 hours)  Resp panel by RT-PCR  (RSV, Flu A&B, Covid) Anterior Nasal Swab     Status: None   Collection Time: 11/03/24 11:49 AM   Specimen: Anterior Nasal Swab  Result Value Ref Range Status   SARS Coronavirus 2 by RT PCR NEGATIVE NEGATIVE Final   Influenza A by PCR NEGATIVE NEGATIVE Final   Influenza B by PCR NEGATIVE NEGATIVE Final    Comment: (NOTE) The Xpert Xpress SARS-CoV-2/FLU/RSV plus assay is intended as an aid in the diagnosis of influenza from Nasopharyngeal swab specimens and should not be used as a sole basis for treatment. Nasal washings and aspirates are unacceptable for Xpert Xpress SARS-CoV-2/FLU/RSV testing.  Fact Sheet for Patients: bloggercourse.com  Fact Sheet for Healthcare Providers: seriousbroker.it  This test is not yet approved or cleared by the [place]  FDA and has been authorized for detection and/or diagnosis of SARS-CoV-2 by FDA under an Emergency Use Authorization (EUA). This EUA will remain in effect (meaning this  test can be used) for the duration of the COVID-19 declaration under Section 564(b)(1) of the Act, 21 U.S.C. section 360bbb-3(b)(1), unless the authorization is terminated or revoked.     Resp Syncytial Virus by PCR NEGATIVE NEGATIVE Final    Comment: (NOTE) Fact Sheet for Patients: bloggercourse.com  Fact Sheet for Healthcare Providers: seriousbroker.it  This test is not yet approved or cleared by the [place]  FDA and has been authorized for detection and/or diagnosis of SARS-CoV-2 by FDA under an Emergency Use Authorization (EUA). This EUA will remain in effect (meaning this test can be used) for the duration of the COVID-19 declaration under Section 564(b)(1) of the Act, 21 U.S.C. section 360bbb-3(b)(1), unless the authorization is terminated or revoked.  Performed at [facility], [address], [place], [place] [address]      Radiology  Studies: No results found.   Scheduled Meds:  acyclovir   400 mg Oral BID   chlorpheniramine-HYDROcodone   5 mL Oral Q12H   cholecalciferol   1,000 Units Oral q AM   cyanocobalamin   1,000 mcg Oral q AM   empagliflozin   10 mg Oral Daily   free water   250 mL Oral Once   gabapentin   100 mg Oral TID   guaiFENesin   10 mL Oral Q8H   levothyroxine   100 mcg Oral Q0600   metoprolol  succinate  50 mg Oral Daily   pantoprazole   40 mg Oral Daily   polyethylene glycol  17 g Oral Daily   rosuvastatin   20 mg Oral Daily   sodium chloride  flush  3 mL Intravenous Q12H   sodium chloride  flush  3 mL Intravenous Q12H   Continuous Infusions:   LOS: 1 day    Time spent:    Sigurd Pac, MD [facility]   11/05/2024, 12:05 PM    "

## 2024-11-06 ENCOUNTER — Other Ambulatory Visit (HOSPITAL_COMMUNITY): Payer: Self-pay

## 2024-11-06 DIAGNOSIS — I5033 Acute on chronic diastolic (congestive) heart failure: Secondary | ICD-10-CM | POA: Diagnosis not present

## 2024-11-06 LAB — BASIC METABOLIC PANEL WITH GFR
Anion gap: 14 (ref 5–15)
BUN: 48 mg/dL — ABNORMAL HIGH (ref 8–23)
CO2: 23 mmol/L (ref 22–32)
Calcium: 9 mg/dL (ref 8.9–10.3)
Chloride: 99 mmol/L (ref 98–111)
Creatinine, Ser: 1.87 mg/dL — ABNORMAL HIGH (ref 0.44–1.00)
GFR, Estimated: 25 mL/min — ABNORMAL LOW
Glucose, Bld: 134 mg/dL — ABNORMAL HIGH (ref 70–99)
Potassium: 4.2 mmol/L (ref 3.5–5.1)
Sodium: 136 mmol/L (ref 135–145)

## 2024-11-06 LAB — CBC
HCT: 36 % (ref 36.0–46.0)
Hemoglobin: 11.3 g/dL — ABNORMAL LOW (ref 12.0–15.0)
MCH: 27.2 pg (ref 26.0–34.0)
MCHC: 31.4 g/dL (ref 30.0–36.0)
MCV: 86.7 fL (ref 80.0–100.0)
Platelets: 147 K/uL — ABNORMAL LOW (ref 150–400)
RBC: 4.15 MIL/uL (ref 3.87–5.11)
RDW: 17.8 % — ABNORMAL HIGH (ref 11.5–15.5)
WBC: 5.6 K/uL (ref 4.0–10.5)
nRBC: 0 % (ref 0.0–0.2)

## 2024-11-06 LAB — GLUCOSE, CAPILLARY: Glucose-Capillary: 110 mg/dL — ABNORMAL HIGH (ref 70–99)

## 2024-11-06 MED ORDER — ROSUVASTATIN CALCIUM 20 MG PO TABS: 10.0000 mg | ORAL_TABLET | Freq: Every day | ORAL | Status: AC

## 2024-11-06 MED ORDER — EMPAGLIFLOZIN 10 MG PO TABS: 10.0000 mg | ORAL_TABLET | Freq: Every day | ORAL | Status: DC

## 2024-11-06 MED ORDER — FUROSEMIDE 40 MG PO TABS
40.0000 mg | ORAL_TABLET | Freq: Every day | ORAL | 0 refills | Status: AC
  Filled 2024-11-06: qty 30, 30d supply, fill #0

## 2024-11-06 MED ORDER — PREDNISONE 20 MG PO TABS
20.0000 mg | ORAL_TABLET | Freq: Every day | ORAL | 0 refills | Status: AC
  Filled 2024-11-06: qty 2, 2d supply, fill #0

## 2024-11-06 MED ORDER — APIXABAN 2.5 MG PO TABS
2.5000 mg | ORAL_TABLET | Freq: Two times a day (BID) | ORAL | Status: DC
  Administered 2024-11-06: 2.5 mg via ORAL
  Filled 2024-11-06: qty 1

## 2024-11-06 MED ORDER — POTASSIUM CHLORIDE CRYS ER 20 MEQ PO TBCR: EXTENDED_RELEASE_TABLET | ORAL | Status: AC

## 2024-11-06 MED ORDER — PREDNISONE 20 MG PO TABS: 40.0000 mg | ORAL_TABLET | Freq: Every day | ORAL | Status: DC

## 2024-11-06 NOTE — Progress Notes (Signed)
 "  Progress Note  Patient Name: Zoe [name] Date of Encounter: 11/06/2024 [facility] Cardiologist: Gordy Bergamo, MD   Interval Summary   Feels better.  The denies dyspnea or chest pain.  Right wrist pain greatly improved (received a dose of prednisone ).  Right heart catheterization showed low filling pressures with right atrial pressure of 0 and mean wedge pressure of 11 mmHg.  Despite low wedge pressure she still had mild pulmonary artery hypertension (39/14, mean 26 mmHg), with a transpulmonary gradient of 15 mmHg, suggesting intrinsic pulmonary arteriolar disease.  Normal cardiac output.    Vital Signs Vitals:   11/05/24 2336 11/06/24 0128 11/06/24 0424 11/06/24 0921  BP: (!) 117/55  108/62   Pulse: (!) 59  65 68  Resp: (!) 21  18   Temp: 97.8 F (36.6 C)  97.8 F (36.6 C)   TempSrc: Oral  Oral   SpO2: 92%  95%   Weight:  75.8 kg    Height:       No intake or output data in the 24 hours ending 11/06/24 0942    11/06/2024    1:28 AM 11/05/2024    3:00 AM 11/04/2024    5:00 AM  Last 3 Weights  Weight (lbs) 167 lb 1.7 oz 166 lb 10.7 oz 165 lb 8 oz  Weight (kg) 75.8 kg 75.6 kg 75.07 kg      Telemetry/ECG  Atrial sensed, ventricular paced- Personally Reviewed  Physical Exam  GEN: No acute distress.   Neck: No JVD Cardiac: RRR, no murmurs, rubs, or gallops.  Respiratory: Clear to auscultation bilaterally. GI: Soft, nontender, non-distended  MS: No edema  Assessment & Plan   HFPEF: She is slightly dry.  Will set her new target dry weight at 170 pounds.  Continue sodium restricted diet.  Take furosemide  40 mg only on days when her weight exceeds 170 pounds (he had been taking it 20 mg 3 times weekly as needed, but with her creatinine clearance of 20 mg dose may be ineffective.) PAH: Mild pulmonary artery hypertension independent of left heart failure, uncertain etiology.  Consider sleep apnea. CHB: Pacemaker dependent.  Narrow paced QRS complex.   Dyssynchrony seems to be well addressed. Prosthetic aortic valve stenosis: Moderate, need to monitor with echocardiography.  Probably only a minor contribution to the current episode of heart failure exacerbation.  Note preserved cardiac output. Acute on CKD stage III: Baseline GFR seems to be around 40-50.  Jardiance  is currently on hold.  Plan to restart next week, allowing renal function to recover. Paroxysmal A-fib: Has maintained normal rhythm (atrial sensed sinus, ventricular paced) throughout this hospitalization. Anticoagulation: At least for now, recommend reducing the dose of Eliquis  to 2.5 mg twice daily (age, renal dysfunction). R wrist pain: Appears to have been an episode of gout or pseudogout triggered by aggressive diuresis.  Markedly improved with just 1 dose of steroids. HTN: BP is low after aggressive diuresis.  Amlodipine  and losartan  are temporarily on hold.  [facility] will sign off.   The patient is ready for discharge today from a cardiac standpoint. Medication Recommendations:   Apixaban  2.5 mg twice daily. Jardiance  10 mg once daily, currently on hold.  Restart on Monday, 11/10/2024 Furosemide  40 mg daily, take only on days when weight exceeds 170 pounds Potassium chloride  20 mEq, take only on days when taking furosemide  Metoprolol  succinate 50 mg daily Rosuvastatin  20 mg daily Amlodipine  and losartan  have been stopped. Other recommendations (labs, testing, etc): Basic metabolic  panel in 1 week.  Very important to measure and keep a log of weights daily.  Take furosemide  40 mg and potassium chloride  20 mEq only on days that weight exceeds 170 pounds.  Bring weight log to clinic appointment. Follow up as an outpatient: Will arrange early follow-up.  For questions or updates, please contact [facility] Please consult [email or web address] for contact info under         Signed, Jerel Balding, MD   "

## 2024-11-06 NOTE — Discharge Summary (Signed)
 Physician Discharge Summary  Zoe [name] FMW:[id number] DOB: Jan 16, 1934 DOA: 11/02/2024  PCP: Gerome Brunet, DO  Admit date: 11/02/2024 Discharge date: 11/06/2024  Time spent: 45 minutes  Recommendations for Outpatient Follow-up:  [facility] heart care on 1/22, check BMP at follow-up Consider scheduled diuretic at follow-up if concern for difficulty managing diuretics based on weight   Discharge Diagnoses:  Principal Problem:   Acute on chronic diastolic heart failure (HCC) Active Problems:   Hypertension   Paroxysmal atrial fibrillation (HCC)   Chronic kidney disease, stage 3b (HCC)   Hyperlipidemia   Hypothyroidism   Multiple myeloma (HCC)   GERD (gastroesophageal reflux disease)   History of stroke   Discharge Condition: Improved  Diet recommendation: Low sodium, heart healthy  Filed Weights   11/04/24 0500 11/05/24 0300 11/06/24 0128  Weight: 75.1 kg 75.6 kg 75.8 kg    History of present illness:    Hospital Course:   Acute on chronic diastolic heart failure Pulmonary hypertension - Echo 12/25 noted EF of 55-60%, mild LVH, grade 2 DD, preserved RV, TAVR, concern for prosthetic valve stenosis - improved with diuresis - RHC noted elevated PA pressures, wedge of 10, low right atrial pressures -Dr. 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 11/19 - Per cardiology, recommended continued follow-up for now   Acute hypoxemic respiratory failure due to cardiogenic pulmonary edema, no clinical sings of pneumonia (ruled out pneumonia)  -Antibiotics discontinued   Right wrist pain, tenderness - Limited range of motion on exam, - Gout versus pseudogout suspected, x-ray was unremarkable, ESR mildly elevated, improving well after dose of prednisone  yesterday, continue this for 2 more days    Hypertension -See discussion above,   Paroxysmal atrial fibrillation (HCC) -Currently in sinus rhythm, continue metoprolol  and apixaban     AKI on CKD 3B -Baseline creatinine around 1.5, 1.6, creatinine up to 2.0 yesterday, losartan  discontinued, creatinine improving, see discussion above    Hyperlipidemia Continue rosuvastatin    Hypothyroidism Continue Synthroid    Multiple myeloma (HCC) Monitor closely, stable Continue empiric acyclovir , Lenalidomide    GERD (gastroesophageal reflux disease) Continue PPI   History of stroke Continue secondary prevention, statins, Eliquis ,    Discharge Exam: Vitals:   11/06/24 0424 11/06/24 0921  BP: 108/62   Pulse: 65 68  Resp: 18   Temp: 97.8 F (36.6 C)   SpO2: 95%    General exam: Appears calm and comfortable, AO x 3 Respiratory system: Clear to auscultation Cardiovascular system: S1 & S2 heard, RRR.  Abd: nondistended, soft and nontender.Normal bowel sounds heard. Central nervous system: Alert and oriented. No focal neurological deficits. Extremities: no edema, right wrist with swelling, tenderness Skin: No rashes Psychiatry:  Mood & affect appropriate  Discharge Instructions   Discharge Instructions     Increase activity slowly   Complete by: As directed       Allergies as of 11/06/2024       Reactions   Quinolones Other (See Comments)   Patient on Amiodarone  and can prolong QT   Penicillins Other (See Comments)   UNSPECIFIED REACTION  Patient does not remember reaction.  Has patient had a PCN reaction causing immediate rash, facial/tongue/throat swelling, SOB or lightheadedness with hypotension: no Has patient had a PCN reaction causing severe rash involving mucus membranes or skin necrosis: no Has patient had a PCN  reaction that required hospitalization no Has patient had a PCN reaction occurring within the last 10 years: no If all of the above answers are NO, then may proceed with Cephalosporin use.    Sulfa Antibiotics Other (See Comments)   UNSPECIFIED REACTION  maybe vision issues?         Medication List     STOP taking these medications    amLODipine  5 MG tablet Commonly known as: NORVASC    losartan  50 MG tablet Commonly known as: COZAAR        TAKE these medications    acetaminophen  500 MG tablet Commonly known as: TYLENOL  Take 1,000 mg by mouth every 6 (six) hours as needed for mild pain (pain score 1-3).   acyclovir  400 MG tablet Commonly known as: ZOVIRAX  Take 400 mg by mouth 2 (two) times daily.   apixaban  5 MG Tabs tablet Commonly known as: Eliquis  Take 1 tablet (5 mg total) by mouth 2 (two) times daily.   Cholecalciferol  25 MCG (1000 UT) Tbdp Take 1,000 Units by mouth in the morning.   cyanocobalamin  1000 MCG tablet Take 1,000 mcg by mouth in the morning.   empagliflozin  10 MG Tabs tablet Commonly known as: Jardiance  Take 1 tablet (10 mg total) by mouth daily. Start taking on: November 07, 2024   furosemide  40 MG tablet Commonly known as: LASIX  Furosemide  40 mg daily, take only on days when weight exceeds 170 pounds What changed:  medication strength additional instructions   gabapentin  100 MG capsule Commonly known as: NEURONTIN  Take 1 capsule (100 mg total) by mouth 3 (three) times daily.   lenalidomide  5 MG capsule Commonly known as: REVLIMID  Take 1 capsule (5 mg total) by mouth daily. Auth #  [id number]  Date obtained: 10/29/2024 Take 1 capsule daily for 21 days and then none for 7 days.   levothyroxine  100 MCG tablet Commonly known as: SYNTHROID  Take 100 mcg by mouth daily before breakfast.   metoprolol  succinate 50 MG 24 hr tablet Commonly known as: TOPROL -XL Take 50 mg by mouth daily. Take with or immediately following a meal. Take in the Morning   oxycodone  5 MG capsule Commonly known as: OXY-IR Take 5 mg by mouth every 12 (twelve) hours as needed for pain.   pantoprazole  40 MG tablet Commonly known as: PROTONIX  Take 1  tablet (40 mg total) by mouth daily.   polyethylene glycol 17 g packet Commonly known as: MIRALAX  / GLYCOLAX  Take 17 g by mouth daily.   potassium chloride  SA 20 MEQ tablet Commonly known as: KLOR-CON  M Take 20meq daily when you take lasix  What changed: additional instructions   predniSONE  20 MG tablet Commonly known as: DELTASONE  Take 1 tablet (20 mg total) by mouth daily with breakfast for 2 days. Start taking on: November 07, 2024   PreserVision AREDS Caps Take 1 capsule by mouth 2 (two) times daily.   rosuvastatin  20 MG tablet Commonly known as: CRESTOR  Take 0.5 tablets (10 mg total) by mouth daily. What changed: how much to take       Allergies[1]  Follow-up Information     Daneen Damien BROCKS, NP Follow up.   Specialties: Cardiology, Family Medicine Why: Davene Nicolas - Cardiology follow-up scheduled on Thursday Nov 13, 2024 at 10:55 AM (Arrive by 10:35 AM). Damien is one of the nurse practitioners that works with Dr. Ladona. We also kept your March 2026 appointment with him. Contact information: [address] [place] [address] [phone]  The results of significant diagnostics from this hospitalization (including imaging, microbiology, ancillary and laboratory) are listed below for reference.    Significant Diagnostic Studies: CARDIAC CATHETERIZATION Result Date: 11/05/2024 Right heart catheterization 11/05/2024: RA 2/0, mean 0 mmHg RV 41/-1, EDP 5 mmHg PA 39/14, mean 23 mmHg.  PA saturation 58%. PW 12/12, mean 8 mmHg.  Aortic saturation was 88%. CO 5.69, CI 3.13.  QP/QS 1.0.  PAPi unable to calculate due to RA pressure of 0. Impression: Normal right heart catheterization with well compensated heart failure, hemodynamics suggest dry state.   DG Wrist 2 Views Right Result Date: 11/05/2024 CLINICAL DATA:  Right wrist pain. EXAM: RIGHT WRIST - 2 VIEW COMPARISON:  02/23/2024 FINDINGS: Diffuse decreased bone mineralization. Degenerative  change of the radiocarpal joint, radial side of the carpal bones and first carpometacarpal joints. No acute fracture or dislocation. Chondrocalcinosis over the triangular fibrocartilage complex. Remainder of the exam is unchanged. IMPRESSION: 1. No acute findings. 2. Degenerative changes as described. Electronically Signed   By: Toribio Agreste M.D.   On: 11/05/2024 12:03   DG Chest Port 1 View Result Date: 11/03/2024 EXAM: 1 VIEW(S) XRAY OF THE CHEST 11/03/2024 12:24:13 AM COMPARISON: 10/08/2024 CLINICAL HISTORY: chest pain FINDINGS: LINES, TUBES AND DEVICES: Left subclavian pacemaker. LUNGS AND PLEURA: Increased interstitial markings, right upper lobe predominant, suggesting residual atypical infection/pneumonia. No pleural effusion. No pneumothorax. HEART AND MEDIASTINUM: Cardiomegaly. Thoracic atherosclerosis. BONES AND SOFT TISSUES: No acute osseous abnormality. IMPRESSION: 1. Increased interstitial markings, right upper lobe predominant, suggesting residual atypical infection/pneumonia. Electronically signed by: Pinkie Pebbles MD MD 11/03/2024 12:28 AM EST RP Workstation: [id number]   DG Chest 2 View Result Date: 10/08/2024 CLINICAL DATA:  Dyspnea. EXAM: CHEST - 2 VIEW COMPARISON:  10/05/2024 FINDINGS: Left chest dual chamber cardiac pacemaker is stable. Mild cardiomegaly is stable. Atherosclerotic calcifications at the aortic arch. Decreased patchy densities in the lungs particularly in the right lung. Elevation of the right hemidiaphragm. Negative for a pneumothorax. Old vertebral body compression fractures with vertebral body augmentation procedures. Status post TAVR. No large pleural effusion. IMPRESSION: 1. Decreased patchy densities in the lungs. Findings could represent resolving infection. 2. Stable cardiomegaly. Electronically Signed   By: Juliene Balder M.D.   On: 10/08/2024 13:59    Microbiology: Recent Results (from the past 240 hours)  Resp panel by RT-PCR (RSV, Flu A&B, Covid) Anterior  Nasal Swab     Status: None   Collection Time: 11/03/24 11:49 AM   Specimen: Anterior Nasal Swab  Result Value Ref Range Status   SARS Coronavirus 2 by RT PCR NEGATIVE NEGATIVE Final   Influenza A by PCR NEGATIVE NEGATIVE Final   Influenza B by PCR NEGATIVE NEGATIVE Final    Comment: (NOTE) The Xpert Xpress SARS-CoV-2/FLU/RSV plus assay is intended as an aid in the diagnosis of influenza from Nasopharyngeal swab specimens and should not be used as a sole basis for treatment. Nasal washings and aspirates are unacceptable for Xpert Xpress SARS-CoV-2/FLU/RSV testing.  Fact Sheet for Patients: bloggercourse.com  Fact Sheet for Healthcare Providers: seriousbroker.it  This test is not yet approved or cleared by the [place]  FDA and has been authorized for detection and/or diagnosis of SARS-CoV-2 by FDA under an Emergency Use Authorization (EUA). This EUA will remain in effect (meaning this test can be used) for the duration of the COVID-19 declaration under Section 564(b)(1) of the Act, 21 U.S.C. section 360bbb-3(b)(1), unless the authorization is terminated or revoked.     Resp Syncytial Virus by PCR NEGATIVE  NEGATIVE Final    Comment: (NOTE) Fact Sheet for Patients: bloggercourse.com  Fact Sheet for Healthcare Providers: seriousbroker.it  This test is not yet approved or cleared by the [place]  FDA and has been authorized for detection and/or diagnosis of SARS-CoV-2 by FDA under an Emergency Use Authorization (EUA). This EUA will remain in effect (meaning this test can be used) for the duration of the COVID-19 declaration under Section 564(b)(1) of the Act, 21 U.S.C. section 360bbb-3(b)(1), unless the authorization is terminated or revoked.  Performed at [facility], [address] [place], [place] [address]      Labs: Basic Metabolic Panel: Recent Labs   Lab 11/03/24 0030 11/03/24 0220 11/04/24 0221 11/04/24 0222 11/05/24 0312 11/05/24 1812 11/06/24 0227  NA 139  --   --  138 139 136  136 136  K 4.0  --   --  3.7 4.0 4.1  4.2 4.2  CL 106  --   --  100 100  --  99  CO2 22  --   --  25 27  --  23  GLUCOSE 93  --   --  99 98  --  134*  BUN 21  --   --  26* 43*  --  48*  CREATININE 1.12*  --   --  1.54* 2.03*  --  1.87*  CALCIUM  8.3*  --   --  8.4* 8.4*  --  9.0  MG  --  1.6* 2.2  --  2.3  --   --   PHOS  --  3.8  --   --   --   --   --    Liver Function Tests: Recent Labs  Lab 11/03/24 0030  AST 15  ALT 6  ALKPHOS 88  BILITOT 0.7  PROT 6.1*  ALBUMIN 3.3*   No results for input(s): LIPASE, AMYLASE in the last 168 hours. No results for input(s): AMMONIA in the last 168 hours. CBC: Recent Labs  Lab 11/03/24 0030 11/04/24 0222 11/05/24 1812 11/06/24 0227  WBC 4.3 5.3  --  5.6  NEUTROABS 1.9  --   --   --   HGB 9.2* 10.4* 11.9*  12.2 11.3*  HCT 30.0* 33.1* 35.0*  36.0 36.0  MCV 89.8 86.2  --  86.7  PLT 141* 141*  --  147*   Cardiac Enzymes: No results for input(s): CKTOTAL, CKMB, CKMBINDEX, TROPONINI in the last 168 hours. BNP: BNP (last 3 results) Recent Labs    10/05/24 0300  BNP 608.5*    ProBNP (last 3 results) Recent Labs    10/08/24 0655 11/03/24 0030  PROBNP 587.0* 1,478.0*    CBG: Recent Labs  Lab 11/04/24 0808 11/05/24 0757 11/06/24 0607  GLUCAP 120* 110* 110*       Signed:  Sigurd Pac MD.  [facility] 11/06/2024, 11:15 AM       [1]  Allergies Allergen Reactions   Quinolones Other (See Comments)    Patient on Amiodarone  and can prolong QT   Penicillins Other (See Comments)    UNSPECIFIED REACTION  Patient does not remember reaction.  Has patient had a PCN reaction causing immediate rash, facial/tongue/throat swelling, SOB or lightheadedness with hypotension: no Has patient had a PCN reaction causing severe rash involving mucus membranes or  skin necrosis: no Has patient had a PCN reaction that required hospitalization no Has patient had a PCN reaction occurring within the last 10 years: no If all of the above answers  are NO, then may proceed with Cephalosporin use.    Sulfa Antibiotics Other (See Comments)    UNSPECIFIED REACTION  maybe vision issues?

## 2024-11-06 NOTE — Progress Notes (Signed)
 Heart Failure Navigator Progress Note  Assessed for Heart & Vascular TOC clinic readiness.  Patient does not meet criteria due to has a scheduled [facility] appointment on 11/13/2024. No HF TOC per Dr. Fairy. .   Navigator available for reassessment of patient.   Stephane Haddock, BSN, Scientist, Clinical (histocompatibility And Immunogenetics) Only

## 2024-11-06 NOTE — Plan of Care (Signed)
 Patient discharging to assisted living

## 2024-11-06 NOTE — Progress Notes (Addendum)
 Appt scheduled 1/22 with E. Monge NP, can get BMET at that visit (added to appt notes for provider). Also has f/u with Dr. Ladona 12/2024 which we will keep.

## 2024-11-06 NOTE — Progress Notes (Signed)
 Discharge instructions reviewed with pt. Copy of instructions given to pt. Pt has scripts filled by [facility] pharmacy and will be picked up on the way out for discharge. Pt's son was called by other staff member and he is on his way and should be here in next few minutes.  Pt will be d/c'd via wheelchair with belongings and will be escorted by staff.    Madelin Barefoot, RN SWOT

## 2024-11-06 NOTE — TOC Transition Note (Addendum)
 Transition of Care [facility]) - Discharge Note   Patient Details  Name: Zoe  DARLENY [name] MRN: [id number] Date of Birth: [birth year]  [facility] CM/SW Contact:  Waddell Barnie Rama, RN Phone Number: 11/06/2024, 11:52 AM   Clinical Narrative:    For dc today, she states her son will transport her home at dc.  She will resume getting physical therapy with [facility] onsite at [employer].  NCM will need to fax order for HHPT to [facility] at [phone].           Patient Goals and CMS Choice            Discharge Placement                       Discharge Plan and Services Additional resources added to the After Visit Summary for                                       Social Drivers of Health (SDOH) Interventions SDOH Screenings   Food Insecurity: No Food Insecurity (11/03/2024)  Housing: Low Risk (11/03/2024)  Transportation Needs: No Transportation Needs (11/03/2024)  Utilities: Not At Risk (11/03/2024)  Alcohol  Screen: Low Risk (10/08/2024)  Depression (PHQ2-9): Low Risk (08/11/2024)  Financial Resource Strain: Low Risk (10/08/2024)  Social Connections: Moderately Integrated (11/03/2024)  Tobacco Use: Low Risk (11/03/2024)     Readmission Risk Interventions    10/07/2024   12:51 PM  Readmission Risk Prevention Plan  Transportation Screening Complete  Medication Review (RN Care Manager) Complete  HRI or Home Care Consult Complete  Palliative Care Screening Not Applicable  Skilled Nursing Facility Not Applicable

## 2024-11-07 ENCOUNTER — Telehealth: Payer: Self-pay

## 2024-11-07 NOTE — Transitions of Care (Post Inpatient/ED Visit) (Signed)
" ° °  11/07/2024  Name: Zoe  DELAINIE [name] MRN: [id number] DOB: [birth year]  Today's TOC FU Call Status: Today's TOC FU Call Status:: Unsuccessful Call (1st Attempt) Unsuccessful Call (1st Attempt) Date: 11/07/24  Attempted to reach the patient regarding the most recent Inpatient/ED visit.  Follow Up Plan: Additional outreach attempts will be made to reach the patient to complete the Transitions of Care (Post Inpatient/ED visit) call.    Jovonni Borquez J. Zyon Grout RN, MSN [facility], [facility] RN Care Manager Direct Dial: [phone]  Fax: [phone] Website: [email or web address]   "

## 2024-11-07 NOTE — Telephone Encounter (Signed)
 Returned call to pt as she had left the office a message inquiring if we were trying to reach her as she had rec'd a call from [facility]. Left message that per her chart, Zoe Leath,RN with Transitions of Care [phone] had tried to follow up with her regarding her recent ED visit.  Left reminder of next visit with our office is for 01/27/25 for labs/clinic visit.

## 2024-11-10 ENCOUNTER — Telehealth: Payer: Self-pay

## 2024-11-10 NOTE — Transitions of Care (Post Inpatient/ED Visit) (Signed)
" ° °  11/10/2024  Name: Zaya  IVON ROEDEL MRN: [id number] DOB: 03-Mar-1934  Today's TOC FU Call Status: Today's TOC FU Call Status:: Unsuccessful Call (2nd Attempt) Unsuccessful Call (2nd Attempt) Date: 11/10/24  Attempted to reach the patient regarding the most recent Inpatient/ED visit.  Follow Up Plan: Additional outreach attempts will be made to reach the patient to complete the Transitions of Care (Post Inpatient/ED visit) call.   Dezaray Shibuya J. Teren Franckowiak RN, MSN [facility], [facility] Health RN Care Manager Direct Dial: [phone]  Fax: [phone] Website: [email or web address]   "

## 2024-11-11 ENCOUNTER — Telehealth: Payer: Self-pay

## 2024-11-11 NOTE — Transitions of Care (Post Inpatient/ED Visit) (Signed)
" ° °  11/11/2024  Name: Zoe [name] MRN: [id number] DOB: November 30, 1933  Today's TOC FU Call Status: Today's TOC FU Call Status:: Unsuccessful Call (3rd Attempt) Unsuccessful Call (3rd Attempt) Date: 11/11/24  Attempted to reach the patient regarding the most recent Inpatient/ED visit.  Follow Up Plan: No further outreach attempts will be made at this time. We have been unable to contact the patient.  Deavion Strider J. Manuel Lawhead RN, MSN [facility], [facility] Health RN Care Manager Direct Dial: [phone]  Fax: [phone] Website: [email or web address]   "

## 2024-11-13 ENCOUNTER — Encounter: Payer: Self-pay | Admitting: Nurse Practitioner

## 2024-11-13 ENCOUNTER — Ambulatory Visit: Attending: Nurse Practitioner | Admitting: Nurse Practitioner

## 2024-11-13 VITALS — BP 120/66 | HR 63 | Ht 63.0 in | Wt 172.0 lb

## 2024-11-13 DIAGNOSIS — Z952 Presence of prosthetic heart valve: Secondary | ICD-10-CM

## 2024-11-13 DIAGNOSIS — I6522 Occlusion and stenosis of left carotid artery: Secondary | ICD-10-CM

## 2024-11-13 DIAGNOSIS — I350 Nonrheumatic aortic (valve) stenosis: Secondary | ICD-10-CM

## 2024-11-13 DIAGNOSIS — I480 Paroxysmal atrial fibrillation: Secondary | ICD-10-CM | POA: Diagnosis not present

## 2024-11-13 DIAGNOSIS — N183 Chronic kidney disease, stage 3 unspecified: Secondary | ICD-10-CM | POA: Diagnosis not present

## 2024-11-13 DIAGNOSIS — I10 Essential (primary) hypertension: Secondary | ICD-10-CM

## 2024-11-13 DIAGNOSIS — E785 Hyperlipidemia, unspecified: Secondary | ICD-10-CM

## 2024-11-13 DIAGNOSIS — Z950 Presence of cardiac pacemaker: Secondary | ICD-10-CM | POA: Diagnosis not present

## 2024-11-13 DIAGNOSIS — I5032 Chronic diastolic (congestive) heart failure: Secondary | ICD-10-CM | POA: Diagnosis not present

## 2024-11-13 DIAGNOSIS — E039 Hypothyroidism, unspecified: Secondary | ICD-10-CM

## 2024-11-13 DIAGNOSIS — I442 Atrioventricular block, complete: Secondary | ICD-10-CM

## 2024-11-13 LAB — CBC

## 2024-11-13 NOTE — Patient Instructions (Signed)
 Medication Instructions:  Continue to hold Jardiance  for now until lab work returns  *If you need a refill on your cardiac medications before your next appointment, please call your pharmacy*  Lab Work: CBC, BMET today  Testing/Procedures: NONE ordered at this time of appointment   Follow-Up: At [facility], you and your health needs are our priority.  As part of our continuing mission to provide you with exceptional heart care, our providers are all part of one team.  This team includes your primary Cardiologist (physician) and Advanced Practice Providers or APPs (Physician Assistants and Nurse Practitioners) who all work together to provide you with the care you need, when you need it.  Your next appointment:    Keep apt   Provider:   Gordy Bergamo, MD    We recommend signing up for the patient portal called MyChart.  Sign up information is provided on this After Visit Summary.  MyChart is used to connect with patients for Virtual Visits (Telemedicine).  Patients are able to view lab/test results, encounter notes, upcoming appointments, etc.  Non-urgent messages can be sent to your provider as well.   To learn more about what you can do with MyChart, go to [email or web address].   Other Instructions

## 2024-11-13 NOTE — Progress Notes (Signed)
 "  Office Visit    Patient Name: Zoe [name] Date of Encounter: 11/13/2024  Primary Care Provider:  Gerome Brunet, DO Primary Cardiologist:  Gordy Bergamo, MD  Chief Complaint    89 year old female with a history of  coronary vasospasm, chronic diastolic heart failure, paroxysmal atrial fibrillation, Stokes-Adams syncope, CHB s/p PPM, severe aortic stenosis s/p TAVR, bilateral carotid artery stenosis, TIA, hypertension, hyperlipidemia, CKD stage III, multiple myeloma, chronic normocytic anemia, hypothyroidism, and GERD who presents for hospital follow-up related to heart failure.   Past Medical History    Past Medical History:  Diagnosis Date   Arthritis    some - per patient   Atrial fibrillation (HCC)    Breast cancer (HCC)    breast cancer / left    Cataract    bilat    GERD (gastroesophageal reflux disease)    History of kidney stones    Hyperlipidemia    Hypertension    Hypothyroidism    Macular degeneration    Left   S/P TAVR (transcatheter aortic valve replacement) 09/03/2018   23 mm Edwards Sapien 3 transcatheter heart valve placed via percutaneous right transfemoral approach    Severe aortic stenosis    Stress incontinence    Thyroid  disease    Tinnitus    Past Surgical History:  Procedure Laterality Date   ABDOMINAL HYSTERECTOMY  1970's   BACK SURGERY     BREAST LUMPECTOMY  12/1998   lumpectomy   CARDIAC CATHETERIZATION     CARDIOVERSION N/A 04/18/2023   Procedure: CARDIOVERSION;  Surgeon: Michele Richardson, DO;  Location: [facility];  Service: Cardiovascular;  Laterality: N/A;   CARDIOVERSION N/A 04/20/2023   Procedure: CARDIOVERSION;  Surgeon: Bergamo Gordy, MD;  Location: [facility];  Service: Cardiovascular;  Laterality: N/A;   EYE SURGERY     cataract surgery bilat    INTRAOPERATIVE TRANSTHORACIC ECHOCARDIOGRAM N/A 09/03/2018   Procedure: INTRAOPERATIVE TRANSTHORACIC ECHOCARDIOGRAM;  Surgeon: Verlin Lonni BIRCH, MD;  Location: [facility] OR;   Service: Open Heart Surgery;  Laterality: N/A;   KYPHOPLASTY N/A 09/07/2022   Procedure: THORACIC EIGHT KYPHOPLASTY;  Surgeon: Burnetta Aures, MD;  Location: [facility] OR;  Service: Orthopedics;  Laterality: N/A;  1 hr Local with IV Regional 3 C-Bed   LITHOTRIPSY     PACEMAKER IMPLANT N/A 01/09/2024   Procedure: PACEMAKER IMPLANT;  Surgeon: Waddell Danelle ORN, MD;  Location: [facility];  Service: Cardiovascular;  Laterality: N/A;   RIGHT HEART CATH N/A 11/05/2024   Procedure: RIGHT HEART CATH;  Surgeon: Bergamo Gordy, MD;  Location: [facility] INVASIVE CV LAB;  Service: Cardiovascular;  Laterality: N/A;   Right total knee     2018 Dr. Ernie   RIGHT/LEFT HEART CATH AND CORONARY ANGIOGRAPHY N/A 08/06/2018   Procedure: RIGHT/LEFT HEART CATH AND CORONARY ANGIOGRAPHY;  Surgeon: Bergamo Gordy, MD;  Location: [facility];  Service: Cardiovascular;  Laterality: N/A;   THYROIDECTOMY, PARTIAL  1975   TONSILLECTOMY     as a child - patient not sure of exact date   TOTAL KNEE ARTHROPLASTY Left 03/13/2016   Procedure: TOTAL KNEE ARTHROPLASTY;  Surgeon: Donnice Ernie, MD;  Location: [facility] ORS;  Service: Orthopedics;  Laterality: Left;   TOTAL KNEE ARTHROPLASTY Right 06/18/2017   Procedure: RIGHT TOTAL KNEE ARTHROPLASTY;  Surgeon: Ernie Donnice, MD;  Location: [facility] ORS;  Service: Orthopedics;  Laterality: Right;   TRANSCATHETER AORTIC VALVE REPLACEMENT, TRANSFEMORAL N/A 09/03/2018   Procedure: TRANSCATHETER AORTIC VALVE REPLACEMENT, TRANSFEMORAL;  Surgeon: Verlin Lonni  D, MD;  Location: [facility] OR;  Service: Open Heart Surgery;  Laterality: N/A;    Allergies  Allergies[1]   Labs/Other Studies Reviewed    The following studies were reviewed today:  Cardiac Studies & Procedures   ______________________________________________________________________________________________ CARDIAC CATHETERIZATION  CARDIAC CATHETERIZATION 11/05/2024  Conclusion Right heart catheterization 11/05/2024: RA 2/0, mean 0 mmHg RV  41/-1, EDP 5 mmHg PA 39/14, mean 23 mmHg.  PA saturation 58%. PW 12/12, mean 8 mmHg.  Aortic saturation was 88%. CO 5.69, CI 3.13.  QP/QS 1.0.  PAPi unable to calculate due to RA pressure of 0.  Impression: Normal right heart catheterization with well compensated heart failure, hemodynamics suggest dry state.     ECHOCARDIOGRAM  ECHOCARDIOGRAM COMPLETE 10/06/2024  Narrative ECHOCARDIOGRAM REPORT    Patient Name:   Zoe [name] Date of Exam: 10/06/2024 Medical Rec #:  [id number]           Height:       64.0 in Accession #:    [id number]          Weight:       187.4 lb Date of Birth:  08-Aug-1934            BSA:          1.903 m Patient Age:    89 years            BP:           114/47 mmHg Patient Gender: F                   HR:           70 bpm. Exam Location:  Inpatient  Procedure: 2D Echo, Cardiac Doppler and Color Doppler (Both Spectral and Color Flow Doppler were utilized during procedure).  Indications:    ACHF- acute diastolic  History:        Patient has prior history of Echocardiogram examinations, most recent 01/09/2024. CHF, Pacemaker, Arrythmias:Atrial Fibrillation; Risk Factors:Hypertension and Dyslipidemia. Aortic Valve: 23 mm Sapien prosthetic, stented (TAVR) valve is present in the aortic position. Procedure Date: 09/03/2018.  Sonographer:    Merlynn Argyle Referring Phys: [id number] CAROLE N HALL  IMPRESSIONS   1. Left ventricular ejection fraction, by estimation, is 55 to 60%. The left ventricle has normal function. The left ventricle has no regional wall motion abnormalities. There is mild concentric left ventricular hypertrophy. Left ventricular diastolic parameters are consistent with Grade II diastolic dysfunction (pseudonormalization). Elevated left atrial pressure. 2. Right ventricular systolic function is normal. The right ventricular size is normal. There is mildly elevated pulmonary artery systolic pressure. The estimated right ventricular systolic  pressure is 37.3 mmHg. 3. Left atrial size was moderately dilated. 4. The mitral valve is degenerative. Mild mitral valve regurgitation. Mild mitral stenosis. The mean mitral valve gradient is 4.0 mmHg with average heart rate of 65 bpm. Moderate to severe mitral annular calcification. 5. Prothetic valve well seated with calcified leaflet and hemodynamics concerning for prosthetic valve stenosis (PV 3.45 m/sec, MG 28 mmHg, and DVI 0.31). Trivial transvalvular regurgitation. The aortic valve has been repaired/replaced. Aortic valve regurgitation is trivial. There is a 23 mm Sapien prosthetic (TAVR) valve present in the aortic position. Procedure Date: 09/03/2018.  Comparison(s): Prior images reviewed side by side. Gradient across prosthetic valve is higher.  FINDINGS Left Ventricle: Left ventricular ejection fraction, by estimation, is 55 to 60%. The left ventricle has normal function. The left ventricle has no regional wall motion abnormalities. The left ventricular  internal cavity size was normal in size. There is mild concentric left ventricular hypertrophy. Left ventricular diastolic function could not be evaluated due to mitral annular calcification (moderate or greater). Left ventricular diastolic parameters are consistent with Grade II diastolic dysfunction (pseudonormalization). Elevated left atrial pressure.  Right Ventricle: The right ventricular size is normal. No increase in right ventricular wall thickness. Right ventricular systolic function is normal. There is mildly elevated pulmonary artery systolic pressure. The tricuspid regurgitant velocity is 2.93 m/s, and with an assumed right atrial pressure of 3 mmHg, the estimated right ventricular systolic pressure is 37.3 mmHg.  Left Atrium: Left atrial size was moderately dilated.  Right Atrium: Right atrial size was normal in size.  Pericardium: There is no evidence of pericardial effusion.  Mitral Valve: The mitral valve is  degenerative in appearance. Moderate to severe mitral annular calcification. Mild mitral valve regurgitation. Mild mitral valve stenosis. The mean mitral valve gradient is 4.0 mmHg with average heart rate of 65 bpm.  Tricuspid Valve: The tricuspid valve is normal in structure. Tricuspid valve regurgitation is not demonstrated. No evidence of tricuspid stenosis.  Aortic Valve: Prothetic valve well seated with calcified leaflet and hemodynamics concerning for prosthetic valve stenosis (PV 3.45 m/sec, MG 28 mmHg, and DVI 0.31). Trivial transvalvular regurgitation. The aortic valve has been repaired/replaced. Aortic valve regurgitation is trivial. Aortic valve mean gradient measures 24.0 mmHg. Aortic valve peak gradient measures 40.5 mmHg. Aortic valve area, by VTI measures 0.76 cm. There is a 23 mm Sapien prosthetic, stented (TAVR) valve present in the aortic position. Procedure Date: 09/03/2018.  Pulmonic Valve: The pulmonic valve was not well visualized. Pulmonic valve regurgitation is not visualized.  Aorta: The aortic root and ascending aorta are structurally normal, with no evidence of dilitation.  IAS/Shunts: No atrial level shunt detected by color flow Doppler.   LEFT VENTRICLE PLAX 2D LVIDd:         4.10 cm   Diastology LVIDs:         2.90 cm   LV e' medial:    4.13 cm/s LV PW:         1.30 cm   LV E/e' medial:  35.6 LV IVS:        1.30 cm   LV e' lateral:   8.33 cm/s LVOT diam:     1.70 cm   LV E/e' lateral: 17.6 LV SV:         55 LV SV Index:   29 LVOT Area:     2.27 cm   RIGHT VENTRICLE             IVC RV Basal diam:  3.40 cm     IVC diam: 1.40 cm RV S prime:     11.50 cm/s TAPSE (M-mode): 1.9 cm  LEFT ATRIUM              Index        RIGHT ATRIUM           Index LA diam:        4.70 cm  2.47 cm/m   RA Area:     12.30 cm LA Vol (A2C):   106.0 ml 55.70 ml/m  RA Volume:   22.50 ml  11.82 ml/m LA Vol (A4C):   78.5 ml  41.25 ml/m LA Biplane Vol: 91.2 ml  47.93  ml/m AORTIC VALVE AV Area (Vmax):    0.65 cm AV Area (Vmean):   0.67 cm AV Area (VTI):  0.76 cm AV Vmax:           318.33 cm/s AV Vmean:          219.200 cm/s AV VTI:            0.730 m AV Peak Grad:      40.5 mmHg AV Mean Grad:      24.0 mmHg LVOT Vmax:         90.70 cm/s LVOT Vmean:        64.867 cm/s LVOT VTI:          0.244 m LVOT/AV VTI ratio: 0.33  AORTA Ao Root diam: 3.00 cm Ao Asc diam:  3.00 cm  MITRAL VALVE                TRICUSPID VALVE MV Area (PHT): 3.34 cm     TR Peak grad:   34.3 mmHg MV Mean grad:  4.0 mmHg     TR Vmax:        293.00 cm/s MV Decel Time: 227 msec MV E velocity: 147.00 cm/s  SHUNTS MV A velocity: 114.00 cm/s  Systemic VTI:  0.24 m MV E/A ratio:  1.29         Systemic Diam: 1.70 cm  Joelle Cedars Tonleu Electronically signed by Joelle Cedars Tonleu Signature Date/Time: 10/06/2024/6:07:11 PM    Final          ______________________________________________________________________________________________     Recent Labs: 10/05/2024: B Natriuretic Peptide 608.5 11/03/2024: ALT 6; Pro Brain Natriuretic Peptide 1,478.0 11/04/2024: TSH 1.380 11/05/2024: Magnesium  2.3 11/06/2024: BUN 48; Creatinine, Ser 1.87; Hemoglobin 11.3; Platelets 147; Potassium 4.2; Sodium 136  Recent Lipid Panel    Component Value Date/Time   CHOL 159 04/01/2024 0433   CHOL 103 07/16/2019 0941   TRIG 56 04/01/2024 0433   HDL 64 04/01/2024 0433   HDL 48 07/16/2019 0941   CHOLHDL 2.5 04/01/2024 0433   VLDL 11 04/01/2024 0433   LDLCALC 84 04/01/2024 0433   LDLCALC 39 07/16/2019 0941   LDLDIRECT 48 07/16/2019 0941    History of Present Illness    89 year old female with the above past medical history including coronary vasospasm, chronic diastolic heart failure, paroxysmal atrial fibrillation, Stokes-Adams syncope, CHB s/p PPM, severe aortic stenosis s/p TAVR, bilateral carotid artery stenosis, TIA, hypertension, hyperlipidemia, CKD stage III, multiple  myeloma, chronic normocytic anemia, hypothyroidism, and GERD.   She has a history of mild coronary artery spasm by coronary angiography in 2014.  She also has a history of severe aortic stenosis s/p TAVR in 2019.  Cardiac catheterization prior to TAVR showed no evidence of coronary artery disease. She was hospitalized in summer 2024 in the setting of A-fib with RVR.  She was hospitalized in 12/2023 in the setting of complete heart block s/p PPM in 12/2023, followed by EP.  Echocardiogram in 12/2023 showed EF 60 to 65%, G2 DD, normal RV systolic function, severely dilated left atrium, mild mitral valve regurgitation, mild aortic valve regurgitation with stable TAVR.  She was hospitalized in June 2025 in the setting of TIA.  She was hospitalized in December 2025 in the setting of acute hypoxemic respiratory failure secondary to acute on chronic diastolic heart failure.  Echocardiogram showed EF 55 to 60%, normal LV function, no RWMA, mild concentric LVH, G2 DD, normal RV systolic function, mildly elevated PASP, moderately dilated left atrium, mild mitral valve regurgitation, mild mitral stenosis, moderate to severe MAC, trivial aortic valve regurgitation, well-seated prosthetic aortic valve with calcified leaflet, hemodynamics  concerning for prosthetic valve stenosis, mean gradient 28 mmHg).  Chest x-ray concerning for chronic infiltrates.  Cardiology was consulted. Amiodarone  was held.  She was diuresed with IV Lasix .  It was noted that should she continue to have heart failure symptoms or worsening dyspnea, she may be a candidate for repeat TAVR.  She was discharged home in stable condition on 10/08/2024.  She contacted our office on 10/17/2024 with concern for increased bilateral lower extremity edema, weight gain. She was advised to double her Lasix  dose and take Lasix  daily for 1 week.  She was last seen in the office on 10/27/2024 and was stable from a cardiac standpoint.  She she was hospitalized from 11/03/2024 to  11/06/2024 in the setting of acute on chronic diastolic heart failure.  RHC on 11/05/2024 was normal.  She was diuresed and discharged home in stable condition.  She was advised to take Lasix  40 mg daily only on days where her weight exceeds 170 pounds.   She presents today for follow-up accompanied by her friend. Since her hospitalization she has done well from a cardiac standpoint. She reports occasional dizziness in the mornings, this has been ongoing for years. She denies any chest pain, palpitations, presyncope, syncope, dyspnea, edema, PND, orthopnea, weight gain.  She has not required any Lasix .  She is working on gradually increasing her activity, she does note some generalized fatigue since her hospital discharge.  She is exercising, participating in physical therapy, walking regularly.   Home Medications    Current Outpatient Medications  Medication Sig Dispense Refill   acetaminophen  (TYLENOL ) 500 MG tablet Take 1,000 mg by mouth every 6 (six) hours as needed for mild pain (pain score 1-3).     acyclovir  (ZOVIRAX ) 400 MG tablet Take 400 mg by mouth 2 (two) times daily.     apixaban  (ELIQUIS ) 5 MG TABS tablet Take 1 tablet (5 mg total) by mouth 2 (two) times daily. 180 tablet 1   Cholecalciferol  25 MCG (1000 UT) TBDP Take 1,000 Units by mouth in the morning.     cyanocobalamin  1000 MCG tablet Take 1,000 mcg by mouth in the morning.     furosemide  (LASIX ) 40 MG tablet Take 1 tablet (40 mg total) by mouth daily. take only on days when weight exceeds 170 pounds 30 tablet 0   gabapentin  (NEURONTIN ) 100 MG capsule Take 1 capsule (100 mg total) by mouth 3 (three) times daily. 90 capsule 0   lenalidomide  (REVLIMID ) 5 MG capsule Take 1 capsule (5 mg total) by mouth daily. Auth #  [id number]  Date obtained: 10/29/2024 Take 1 capsule daily for 21 days and then none for 7 days. 21 capsule 0   levothyroxine  (SYNTHROID , LEVOTHROID) 100 MCG tablet Take 100 mcg by mouth daily before breakfast.  2   metoprolol   succinate (TOPROL -XL) 50 MG 24 hr tablet Take 50 mg by mouth daily. Take with or immediately following a meal. Take in the Morning     Multiple Vitamins-Minerals (PRESERVISION AREDS) CAPS Take 1 capsule by mouth 2 (two) times daily.     pantoprazole  (PROTONIX ) 40 MG tablet Take 1 tablet (40 mg total) by mouth daily.     polyethylene glycol (MIRALAX  / GLYCOLAX ) 17 g packet Take 17 g by mouth daily.     potassium chloride  SA (KLOR-CON  M) 20 MEQ tablet Take 20meq daily when you take lasix      rosuvastatin  (CRESTOR ) 20 MG tablet Take 0.5 tablets (10 mg total) by mouth daily.  empagliflozin  (JARDIANCE ) 10 MG TABS tablet Take 1 tablet (10 mg total) by mouth daily.     oxycodone  (OXY-IR) 5 MG capsule Take 5 mg by mouth every 12 (twelve) hours as needed for pain. (Patient not taking: Reported on 11/13/2024)     No current facility-administered medications for this visit.     Review of Systems    She denies chest pain, palpitations, dyspnea, pnd, orthopnea, n, v, syncope, edema, weight gain, or early satiety. All other systems reviewed and are otherwise negative except as noted above.   Physical Exam    VS:  BP 120/66 (BP Location: Right Arm, Patient Position: Sitting, Cuff Size: Normal)   Pulse 63   Ht 5' 3 (1.6 m)   Wt 172 lb (78 kg)   SpO2 97%   BMI 30.47 kg/m   GEN: Well nourished, well developed, in no acute distress. HEENT: normal. Neck: Supple, no JVD, carotid bruits, or masses. Cardiac: RRR, no murmurs, rubs, or gallops. No clubbing, cyanosis, edema.  Radials/DP/PT 2+ and equal bilaterally.  Respiratory:  Respirations regular and unlabored, clear to auscultation bilaterally. GI: Soft, nontender, nondistended, BS + x 4. MS: no deformity or atrophy. Skin: warm and dry, no rash. Neuro:  Strength and sensation are intact. Psych: Normal affect.  Accessory Clinical Findings    ECG personally reviewed by me today - EKG Interpretation Date/Time:  Thursday November 13 2024 10:14:15  EST Ventricular Rate:  63 PR Interval:  220 QRS Duration:  112 QT Interval:  446 QTC Calculation: 456 R Axis:   -36  Text Interpretation: Atrial-sensed ventricular-paced rhythm with prolonged AV conduction When compared with ECG of 03-Nov-2024 00:12, PREVIOUS ECG IS PRESENT Confirmed by Daneen Perkins ([id number]) on 11/13/2024 10:18:37 AM  - no acute changes.   Lab Results  Component Value Date   WBC 5.6 11/06/2024   HGB 11.3 (L) 11/06/2024   HCT 36.0 11/06/2024   MCV 86.7 11/06/2024   PLT 147 (L) 11/06/2024   Lab Results  Component Value Date   CREATININE 1.87 (H) 11/06/2024   BUN 48 (H) 11/06/2024   NA 136 11/06/2024   K 4.2 11/06/2024   CL 99 11/06/2024   CO2 23 11/06/2024   Lab Results  Component Value Date   ALT 6 11/03/2024   AST 15 11/03/2024   ALKPHOS 88 11/03/2024   BILITOT 0.7 11/03/2024   Lab Results  Component Value Date   CHOL 159 04/01/2024   HDL 64 04/01/2024   LDLCALC 84 04/01/2024   LDLDIRECT 48 07/16/2019   TRIG 56 04/01/2024   CHOLHDL 2.5 04/01/2024    Lab Results  Component Value Date   HGBA1C 5.1 03/31/2024    Assessment & Plan    1. Chronic diastolic heart failure: She was hospitalized in December 2025 in January 2026 in the setting of acute on chronic diastolic heart failure. Echocardiogram in 09/2024 showed EF 55 to 60%, normal LV function, no RWMA, mild concentric LVH, G2 DD, normal RV systolic function, mildly elevated PASP, moderately dilated left atrium, mild mitral valve regurgitation, mild mitral stenosis, moderate to severe MAC, trivial aortic valve regurgitation, well-seated prosthetic aortic valve with calcified leaflet, hemodynamics concerning for prosthetic valve stenosis, mean gradient 28 mmHg). RHC on 11/05/2024 was normal.  She was diuresed and discharged home in stable condition. She was advised to take Lasix  40 mg daily only on days where her weight exceeds 170 pounds.  Jardiance  was held in the setting of acute AKI.  Euvolemic and  well  compensated on exam.  She has not required any Lasix  since hospital discharge.  She does note some generalized fatigue, encouraged increase activity as tolerated.  Will check CBC, BMET.  If renal function stable, will likely resume Jardiance .  Continue metoprolol , Lasix  as needed.  2. Severe aortic stenosis: S/p TAVR in 2019.  Most recent echo in 09/2024 showed well-seated prosthetic aortic valve with calcified leaflet, hemodynamics concerning for prosthetic valve stenosis, mean gradient 28 mmHg).  It was noted that should she continue to have heart failure symptoms or worsening dyspnea, she may be a candidate for repeat TAVR.  Clinically stable in office today.  Consider repeat echocardiogram in 6 months to 1 year, sooner if clinically indicated.  Continue Lasix  as needed, SBE prophylaxis.   3. Paroxysmal atrial fibrillation: Maintaining NSR. Most recent device interrogation in 08/2024 showed 1 episode of atrial fibrillation/atrial flutter, rate controlled, duration 1 hour to 40 minutes. Continue metoprolol , Eliquis .    4. CHB s/p PPM: Most recent device interrogation 08/2024 showed normal device function. Followed by EP.    5. Bilateral carotid artery stenosis: Carotid ultrasound in 5/25 revealed 1 to 39% B ICA stenosis.  Follow-up study recommended in 12 months. Asymptomatic. Continue Crestor .   6. Hypertension: BP well controlled. Continue current antihypertensive regimen.    7. Hyperlipidemia: LDL was 84 in 03/2024.  Continue Crestor .   8. CKD stage III: Recent AKI in the setting of diuresis.  Creatinine was 1.87 on 11/06/2024.  Will repeat BMET.   9. Hypothyroidism: TSH was 1.380 in 1/26.  On levothyroxine .  Monitored and managed per PCP.   10. Disposition: Follow-up as scheduled with Dr. Ladona in 12/2024.    Damien JAYSON Braver, NP 11/13/2024, 11:53 AM       [1]  Allergies Allergen Reactions   Quinolones Other (See Comments)    Patient on Amiodarone  and can prolong QT   Penicillins  Other (See Comments)    UNSPECIFIED REACTION  Patient does not remember reaction.  Has patient had a PCN reaction causing immediate rash, facial/tongue/throat swelling, SOB or lightheadedness with hypotension: no Has patient had a PCN reaction causing severe rash involving mucus membranes or skin necrosis: no Has patient had a PCN reaction that required hospitalization no Has patient had a PCN reaction occurring within the last 10 years: no If all of the above answers are NO, then may proceed with Cephalosporin use.    Sulfa Antibiotics Other (See Comments)    UNSPECIFIED REACTION  maybe vision issues?    "

## 2024-11-14 LAB — CBC
Hematocrit: 34.6 % (ref 34.0–46.6)
Hemoglobin: 10.6 g/dL — AB (ref 11.1–15.9)
MCH: 27.2 pg (ref 26.6–33.0)
MCHC: 30.6 g/dL — AB (ref 31.5–35.7)
MCV: 89 fL (ref 79–97)
Platelets: 120 x10E3/uL — AB (ref 150–450)
RBC: 3.9 x10E6/uL (ref 3.77–5.28)
RDW: 16.3 % — AB (ref 11.7–15.4)
WBC: 6 x10E3/uL (ref 3.4–10.8)

## 2024-11-14 LAB — BASIC METABOLIC PANEL WITH GFR
BUN/Creatinine Ratio: 22 (ref 12–28)
BUN: 27 mg/dL (ref 8–27)
CO2: 18 mmol/L — ABNORMAL LOW (ref 20–29)
Calcium: 8.5 mg/dL — ABNORMAL LOW (ref 8.7–10.3)
Chloride: 107 mmol/L — ABNORMAL HIGH (ref 96–106)
Creatinine, Ser: 1.22 mg/dL — ABNORMAL HIGH (ref 0.57–1.00)
Glucose: 105 mg/dL — ABNORMAL HIGH (ref 70–99)
Potassium: 4.1 mmol/L (ref 3.5–5.2)
Sodium: 139 mmol/L (ref 134–144)
eGFR: 43 mL/min/1.73 — ABNORMAL LOW

## 2024-11-18 ENCOUNTER — Other Ambulatory Visit: Payer: Self-pay

## 2024-11-18 ENCOUNTER — Ambulatory Visit: Payer: Self-pay | Admitting: Nurse Practitioner

## 2024-11-18 DIAGNOSIS — Z79899 Other long term (current) drug therapy: Secondary | ICD-10-CM

## 2024-11-18 NOTE — Telephone Encounter (Signed)
See pt's note below

## 2024-11-21 ENCOUNTER — Encounter (INDEPENDENT_AMBULATORY_CARE_PROVIDER_SITE_OTHER): Admitting: Ophthalmology

## 2024-11-24 ENCOUNTER — Ambulatory Visit

## 2024-11-24 DIAGNOSIS — I442 Atrioventricular block, complete: Secondary | ICD-10-CM

## 2024-11-25 ENCOUNTER — Telehealth: Payer: Self-pay | Admitting: Cardiology

## 2024-11-25 LAB — CUP PACEART REMOTE DEVICE CHECK
Battery Remaining Longevity: 134 mo
Battery Voltage: 3.1 V
Brady Statistic AP VP Percent: 25.98 %
Brady Statistic AP VS Percent: 0 %
Brady Statistic AS VP Percent: 73.86 %
Brady Statistic AS VS Percent: 0.16 %
Brady Statistic RA Percent Paced: 26.02 %
Brady Statistic RV Percent Paced: 99.84 %
Date Time Interrogation Session: 20260202055719
Implantable Lead Connection Status: 753985
Implantable Lead Connection Status: 753985
Implantable Lead Implant Date: 20250319
Implantable Lead Implant Date: 20250319
Implantable Lead Location: 753859
Implantable Lead Location: 753860
Implantable Lead Model: 3830
Implantable Lead Model: 5076
Implantable Pulse Generator Implant Date: 20250319
Lead Channel Impedance Value: 285 Ohm
Lead Channel Impedance Value: 342 Ohm
Lead Channel Impedance Value: 380 Ohm
Lead Channel Impedance Value: 494 Ohm
Lead Channel Pacing Threshold Amplitude: 0.5 V
Lead Channel Pacing Threshold Amplitude: 1.125 V
Lead Channel Pacing Threshold Pulse Width: 0.4 ms
Lead Channel Pacing Threshold Pulse Width: 0.4 ms
Lead Channel Sensing Intrinsic Amplitude: 3 mV
Lead Channel Sensing Intrinsic Amplitude: 3 mV
Lead Channel Sensing Intrinsic Amplitude: 8.625 mV
Lead Channel Sensing Intrinsic Amplitude: 8.625 mV
Lead Channel Setting Pacing Amplitude: 1.5 V
Lead Channel Setting Pacing Amplitude: 2.5 V
Lead Channel Setting Pacing Pulse Width: 0.4 ms
Lead Channel Setting Sensing Sensitivity: 1.2 mV
Zone Setting Status: 755011

## 2024-11-25 MED ORDER — EMPAGLIFLOZIN 10 MG PO TABS: 10.0000 mg | ORAL_TABLET | Freq: Every day | ORAL | 5 refills | Status: AC

## 2024-11-25 NOTE — Telephone Encounter (Signed)
 Called and spoke to pt. She stays at [facility] assisted living facility. The CNA there had taken her BP today and had to call the nurse. The nurse then took it and it was 158/70. No other readings available, but pt states that the last few days, her BP has been elevated ranging in 150's/70's in the mornings, then it will decrease by noon time.   She did have one brief episode of chest pain last night, but feels like it was related to a high sodium dinner. She has been fatigued (ever since she was last d/c from hospital mid January). The other symptom is that she will feel short of breath during the night time and in the early morning hours. The SOB started several nights ago. The other symptom she is having is a mild cough; but no phlegm. She did take Lasix  40 mg last week, but has not needed any this week as her weights are stable.   Saw NP on 11/13/24, Jardiance  was stopped and labs drawn. Gave pt the labs result message:   Zoe Damien BROCKS, NP    11/18/24  3:38 PM Result Note Labs show stable kidney function and electrolytes.  There is evidence of ongoing anemia, hemoglobin is overall stable.  Okay to resume Jardiance .  Repeat BMET in 2 weeks.  Otherwise, continue current medications and follow-up as planned.  Thank you-EM  Pt would like Jardiance  RX sent to [facility] on [address]. She would like to know if she needs to see Dr. Ladona sooner than 01/01/25.

## 2024-11-25 NOTE — Telephone Encounter (Signed)
 Pt c/o BP issue: STAT if pt c/o blurred vision, one-sided weakness or slurred speech.  STAT if BP is GREATER than 180/120 TODAY.  STAT if BP is LESS than 90/60 and SYMPTOMATIC TODAY  1. What is your BP concern? Elevated   2. Have you taken any BP medication today? Yes   3. What are your last 5 BP readings?  158/70    4. Are you having any other symptoms (ex. Dizziness, headache, blurred vision, passed out)?    Tired, SOB

## 2024-11-26 NOTE — Telephone Encounter (Signed)
 Patient returned RN's call regarding BP.

## 2024-11-26 NOTE — Telephone Encounter (Signed)
 Patient asked why she has not yet received a callback from our office regarding previous message.  Informed patient phone note from yesterday was sent to provider and we are currently waiting for a response.  Patient asked if it was OK for her to take Jardiance . Reviewed note from recent lab result where Damien recommended restarting Jardiance  with F/U BMET in 2 weeks. Patient restarted Jardiance  yesterday and will plan to have BMET drawn in 2 weeks (week of Feb. 16th).  Will forward message to both Damien and Dr. Ladona for review to see if any recommendations or need to see Dr. Ladona sooner than 01/01/25.

## 2024-11-26 NOTE — Telephone Encounter (Signed)
 Spoke with patient and shared message from [place]: Would recommend she resume Jardiance  as previously recommended. Would also recommend she take Lasix  40 mg daily x 3 days to see if her symptoms improve. If no improvement in symptoms, recommend she schedule a sooner follow-up. Otherwise, repeat blood work as planned, follow-up as scheduled. Thank you-EM   Patient verbalized understanding and agrees with plan.

## 2024-11-28 ENCOUNTER — Other Ambulatory Visit: Payer: Self-pay | Admitting: *Deleted

## 2024-11-28 ENCOUNTER — Telehealth: Payer: Self-pay | Admitting: Cardiology

## 2024-11-28 DIAGNOSIS — C9000 Multiple myeloma not having achieved remission: Secondary | ICD-10-CM

## 2024-11-28 MED ORDER — LENALIDOMIDE 5 MG PO CAPS: 5.0000 mg | ORAL_CAPSULE | Freq: Every day | ORAL | 0 refills | Status: AC

## 2024-11-28 NOTE — Progress Notes (Signed)
 Remote PPM Transmission

## 2024-11-28 NOTE — Telephone Encounter (Signed)
 Spoke with pt. Pt took medication as instructed. Bp 141/70 today at 11 am by nurse. Wanted to make sure Damien did not want her to follow up sooner than scheduled appointment.  Pt states she does feel like she is feeling better. Pt stated she would get labs completed. Pt wanted the message to go to [facility] to see if the follow up was needed. Next open appt is same week as current set appt, but offered it to pt. Pt asked me to route to [facility] for review.

## 2024-11-28 NOTE — Telephone Encounter (Signed)
 Pt c/o BP issue: STAT if pt c/o blurred vision, one-sided weakness or slurred speech.  STAT if BP is GREATER than 180/120 TODAY.  STAT if BP is LESS than 90/60 and SYMPTOMATIC TODAY  1. What is your BP concern?   See below  2. Have you taken any BP medication today?  Yes at 9:00 am  3. What are your last 5 BP readings?  141/70 taken by the Nurse at [place]  4. Are you having any other symptoms (ex. Dizziness, headache, blurred vision, passed out)?   Dizziness   Patient stated her BP has been trending high and she has taken potassium chloride  SA (KLOR-CON  M) 20 MEQ tablet and furosemide  (LASIX ) 40 MG tablet  for the 3 days recommended by CHARLENA Braver, NP.  Patient wants a call back on next steps.

## 2025-01-01 ENCOUNTER — Ambulatory Visit: Admitting: Cardiology

## 2025-01-27 ENCOUNTER — Inpatient Hospital Stay: Admitting: Physician Assistant

## 2025-01-27 ENCOUNTER — Inpatient Hospital Stay

## 2025-02-23 ENCOUNTER — Encounter
